# Patient Record
Sex: Female | Born: 1950 | Race: Black or African American | Hispanic: No | Marital: Single | Smoking: Former smoker
Health system: Southern US, Community
[De-identification: ages and names within clinical notes are randomized; demographics above are authoritative.]

## PROBLEM LIST (undated history)

## (undated) DIAGNOSIS — F419 Anxiety disorder, unspecified: Secondary | ICD-10-CM

## (undated) DIAGNOSIS — Z89611 Acquired absence of right leg above knee: Secondary | ICD-10-CM

## (undated) DIAGNOSIS — I219 Acute myocardial infarction, unspecified: Secondary | ICD-10-CM

## (undated) DIAGNOSIS — R42 Dizziness and giddiness: Secondary | ICD-10-CM

## (undated) DIAGNOSIS — G43909 Migraine, unspecified, not intractable, without status migrainosus: Secondary | ICD-10-CM

## (undated) DIAGNOSIS — I10 Essential (primary) hypertension: Secondary | ICD-10-CM

## (undated) DIAGNOSIS — I739 Peripheral vascular disease, unspecified: Secondary | ICD-10-CM

## (undated) DIAGNOSIS — R112 Nausea with vomiting, unspecified: Secondary | ICD-10-CM

## (undated) DIAGNOSIS — Z9889 Other specified postprocedural states: Secondary | ICD-10-CM

## (undated) DIAGNOSIS — M1990 Unspecified osteoarthritis, unspecified site: Secondary | ICD-10-CM

## (undated) DIAGNOSIS — H269 Unspecified cataract: Secondary | ICD-10-CM

## (undated) DIAGNOSIS — N184 Chronic kidney disease, stage 4 (severe): Secondary | ICD-10-CM

## (undated) DIAGNOSIS — I2510 Atherosclerotic heart disease of native coronary artery without angina pectoris: Secondary | ICD-10-CM

## (undated) DIAGNOSIS — I639 Cerebral infarction, unspecified: Secondary | ICD-10-CM

## (undated) DIAGNOSIS — R51 Headache: Secondary | ICD-10-CM

## (undated) DIAGNOSIS — I214 Non-ST elevation (NSTEMI) myocardial infarction: Secondary | ICD-10-CM

## (undated) DIAGNOSIS — K649 Unspecified hemorrhoids: Secondary | ICD-10-CM

## (undated) DIAGNOSIS — E785 Hyperlipidemia, unspecified: Secondary | ICD-10-CM

## (undated) DIAGNOSIS — Z972 Presence of dental prosthetic device (complete) (partial): Secondary | ICD-10-CM

## (undated) DIAGNOSIS — D649 Anemia, unspecified: Secondary | ICD-10-CM

## (undated) DIAGNOSIS — K08109 Complete loss of teeth, unspecified cause, unspecified class: Secondary | ICD-10-CM

## (undated) DIAGNOSIS — E119 Type 2 diabetes mellitus without complications: Secondary | ICD-10-CM

## (undated) DIAGNOSIS — K219 Gastro-esophageal reflux disease without esophagitis: Secondary | ICD-10-CM

## (undated) DIAGNOSIS — Z8719 Personal history of other diseases of the digestive system: Secondary | ICD-10-CM

## (undated) HISTORY — PX: TONSILLECTOMY: SUR1361

## (undated) HISTORY — DX: Essential (primary) hypertension: I10

## (undated) HISTORY — DX: Acute myocardial infarction, unspecified: I21.9

## (undated) HISTORY — PX: CLEFT PALATE REPAIR: SUR1165

## (undated) HISTORY — PX: TUBAL LIGATION: SHX77

## (undated) HISTORY — DX: Peripheral vascular disease, unspecified: I73.9

## (undated) HISTORY — PX: MYOMECTOMY: SHX85

## (undated) HISTORY — DX: Hyperlipidemia, unspecified: E78.5

## (undated) HISTORY — PX: MULTIPLE TOOTH EXTRACTIONS: SHX2053

## (undated) HISTORY — PX: DILATION AND CURETTAGE OF UTERUS: SHX78

## (undated) HISTORY — DX: Cerebral infarction, unspecified: I63.9

## (undated) HISTORY — PX: COLONOSCOPY: SHX174

---

## 1989-05-20 DIAGNOSIS — I219 Acute myocardial infarction, unspecified: Secondary | ICD-10-CM

## 1989-05-20 HISTORY — DX: Acute myocardial infarction, unspecified: I21.9

## 1997-12-26 ENCOUNTER — Ambulatory Visit (HOSPITAL_COMMUNITY): Admission: RE | Admit: 1997-12-26 | Discharge: 1997-12-26 | Payer: Self-pay | Admitting: *Deleted

## 1998-02-11 ENCOUNTER — Ambulatory Visit (HOSPITAL_COMMUNITY): Admission: RE | Admit: 1998-02-11 | Discharge: 1998-02-11 | Payer: Self-pay | Admitting: *Deleted

## 1999-01-06 ENCOUNTER — Ambulatory Visit (HOSPITAL_COMMUNITY): Admission: RE | Admit: 1999-01-06 | Discharge: 1999-01-06 | Payer: Self-pay | Admitting: Cardiology

## 1999-01-06 ENCOUNTER — Encounter: Payer: Self-pay | Admitting: Cardiology

## 2001-07-11 ENCOUNTER — Emergency Department (HOSPITAL_COMMUNITY): Admission: EM | Admit: 2001-07-11 | Discharge: 2001-07-11 | Payer: Self-pay | Admitting: Emergency Medicine

## 2002-12-06 ENCOUNTER — Ambulatory Visit (HOSPITAL_COMMUNITY): Admission: RE | Admit: 2002-12-06 | Discharge: 2002-12-06 | Payer: Self-pay | Admitting: Cardiology

## 2002-12-06 ENCOUNTER — Encounter: Payer: Self-pay | Admitting: Cardiology

## 2004-03-14 ENCOUNTER — Emergency Department (HOSPITAL_COMMUNITY): Admission: EM | Admit: 2004-03-14 | Discharge: 2004-03-14 | Payer: Self-pay | Admitting: Emergency Medicine

## 2004-04-21 ENCOUNTER — Emergency Department (HOSPITAL_COMMUNITY): Admission: EM | Admit: 2004-04-21 | Discharge: 2004-04-22 | Payer: Self-pay | Admitting: Emergency Medicine

## 2004-04-22 ENCOUNTER — Emergency Department (HOSPITAL_COMMUNITY): Admission: EM | Admit: 2004-04-22 | Discharge: 2004-04-22 | Payer: Self-pay | Admitting: Emergency Medicine

## 2005-08-05 ENCOUNTER — Ambulatory Visit: Payer: Self-pay | Admitting: Nurse Practitioner

## 2005-08-26 ENCOUNTER — Ambulatory Visit: Payer: Self-pay | Admitting: Nurse Practitioner

## 2005-09-23 ENCOUNTER — Ambulatory Visit: Payer: Self-pay | Admitting: *Deleted

## 2005-09-23 ENCOUNTER — Ambulatory Visit (HOSPITAL_COMMUNITY): Admission: RE | Admit: 2005-09-23 | Discharge: 2005-09-23 | Payer: Self-pay | Admitting: Family Medicine

## 2005-12-01 ENCOUNTER — Ambulatory Visit: Payer: Self-pay | Admitting: Nurse Practitioner

## 2006-02-17 ENCOUNTER — Ambulatory Visit: Payer: Self-pay | Admitting: Nurse Practitioner

## 2006-08-17 ENCOUNTER — Ambulatory Visit: Payer: Self-pay | Admitting: Nurse Practitioner

## 2007-06-06 ENCOUNTER — Encounter (INDEPENDENT_AMBULATORY_CARE_PROVIDER_SITE_OTHER): Payer: Self-pay | Admitting: *Deleted

## 2007-07-06 ENCOUNTER — Encounter (INDEPENDENT_AMBULATORY_CARE_PROVIDER_SITE_OTHER): Payer: Self-pay | Admitting: Nurse Practitioner

## 2007-07-06 ENCOUNTER — Ambulatory Visit: Payer: Self-pay | Admitting: Family Medicine

## 2007-07-06 LAB — CONVERTED CEMR LAB
ALT: 17 units/L (ref 0–35)
AST: 13 units/L (ref 0–37)
Basophils Absolute: 0 10*3/uL (ref 0.0–0.1)
Basophils Relative: 0 % (ref 0–1)
Calcium: 10 mg/dL (ref 8.4–10.5)
Chloride: 101 meq/L (ref 96–112)
Creatinine, Ser: 0.68 mg/dL (ref 0.40–1.20)
Hemoglobin: 13.4 g/dL (ref 12.0–15.0)
MCHC: 31.5 g/dL (ref 30.0–36.0)
Microalb, Ur: 2.92 mg/dL — ABNORMAL HIGH (ref 0.00–1.89)
Monocytes Absolute: 0.5 10*3/uL (ref 0.2–0.7)
Neutro Abs: 4.1 10*3/uL (ref 1.7–7.7)
Neutrophils Relative %: 50 % (ref 43–77)
Platelets: 279 10*3/uL (ref 150–400)
RDW: 12.6 % (ref 11.5–14.0)
Sodium: 139 meq/L (ref 135–145)
Total Protein: 6.6 g/dL (ref 6.0–8.3)

## 2008-07-30 ENCOUNTER — Encounter (INDEPENDENT_AMBULATORY_CARE_PROVIDER_SITE_OTHER): Payer: Self-pay | Admitting: Internal Medicine

## 2008-07-30 ENCOUNTER — Ambulatory Visit: Payer: Self-pay | Admitting: Internal Medicine

## 2008-07-30 LAB — CONVERTED CEMR LAB
AST: 10 units/L (ref 0–37)
BUN: 14 mg/dL (ref 6–23)
CO2: 23 meq/L (ref 19–32)
Calcium: 9.6 mg/dL (ref 8.4–10.5)
Chloride: 102 meq/L (ref 96–112)
Creatinine, Ser: 0.66 mg/dL (ref 0.40–1.20)

## 2008-09-15 ENCOUNTER — Ambulatory Visit: Payer: Self-pay | Admitting: Internal Medicine

## 2008-09-26 ENCOUNTER — Ambulatory Visit (HOSPITAL_COMMUNITY): Admission: RE | Admit: 2008-09-26 | Discharge: 2008-09-26 | Payer: Self-pay | Admitting: Family Medicine

## 2008-11-12 ENCOUNTER — Ambulatory Visit: Payer: Self-pay | Admitting: Family Medicine

## 2009-04-10 ENCOUNTER — Ambulatory Visit: Payer: Self-pay | Admitting: Family Medicine

## 2009-07-02 ENCOUNTER — Encounter (INDEPENDENT_AMBULATORY_CARE_PROVIDER_SITE_OTHER): Payer: Self-pay | Admitting: Internal Medicine

## 2009-07-02 ENCOUNTER — Ambulatory Visit: Payer: Self-pay | Admitting: Internal Medicine

## 2009-07-02 LAB — CONVERTED CEMR LAB
Cholesterol: 296 mg/dL — ABNORMAL HIGH (ref 0–200)
HDL: 48 mg/dL (ref >39)
Total CHOL/HDL Ratio: 6.2

## 2009-07-10 ENCOUNTER — Ambulatory Visit: Payer: Self-pay | Admitting: Internal Medicine

## 2009-11-20 ENCOUNTER — Encounter (INDEPENDENT_AMBULATORY_CARE_PROVIDER_SITE_OTHER): Payer: Self-pay | Admitting: Internal Medicine

## 2009-11-20 ENCOUNTER — Ambulatory Visit: Payer: Self-pay | Admitting: Family Medicine

## 2009-12-21 ENCOUNTER — Ambulatory Visit: Payer: Self-pay | Admitting: Internal Medicine

## 2009-12-21 LAB — CONVERTED CEMR LAB
BUN: 18 mg/dL (ref 6–23)
Calcium: 9.6 mg/dL (ref 8.4–10.5)
Glucose, Bld: 448 mg/dL — ABNORMAL HIGH (ref 70–99)
Hgb A1c MFr Bld: 14.1 % — ABNORMAL HIGH (ref 4.6–6.1)

## 2010-01-01 ENCOUNTER — Ambulatory Visit: Payer: Self-pay | Admitting: Internal Medicine

## 2010-03-01 ENCOUNTER — Ambulatory Visit: Payer: Self-pay | Admitting: Internal Medicine

## 2010-07-20 ENCOUNTER — Encounter (INDEPENDENT_AMBULATORY_CARE_PROVIDER_SITE_OTHER): Payer: Self-pay | Admitting: *Deleted

## 2010-07-20 LAB — CONVERTED CEMR LAB
AST: 11 units/L (ref 0–37)
Alkaline Phosphatase: 88 units/L (ref 39–117)
BUN: 14 mg/dL (ref 6–23)
Creatinine, Ser: 0.73 mg/dL (ref 0.40–1.20)
HDL: 46 mg/dL (ref >39)
Hgb A1c MFr Bld: 12 % — ABNORMAL HIGH (ref <5.7)
LDL Cholesterol: 221 mg/dL — ABNORMAL HIGH (ref 0–99)
Total Bilirubin: 0.8 mg/dL (ref 0.3–1.2)
Total CHOL/HDL Ratio: 6.7

## 2010-08-17 ENCOUNTER — Telehealth (INDEPENDENT_AMBULATORY_CARE_PROVIDER_SITE_OTHER): Payer: Self-pay | Admitting: Radiology

## 2010-08-18 ENCOUNTER — Ambulatory Visit: Payer: Self-pay

## 2010-08-18 ENCOUNTER — Encounter: Payer: Self-pay | Admitting: Cardiology

## 2010-08-18 ENCOUNTER — Ambulatory Visit: Payer: Self-pay | Admitting: Cardiology

## 2010-08-18 ENCOUNTER — Ambulatory Visit (HOSPITAL_COMMUNITY)
Admission: RE | Admit: 2010-08-18 | Discharge: 2010-08-18 | Payer: Self-pay | Source: Home / Self Care | Admitting: Internal Medicine

## 2010-08-18 ENCOUNTER — Encounter (INDEPENDENT_AMBULATORY_CARE_PROVIDER_SITE_OTHER): Payer: Self-pay

## 2010-10-21 NOTE — Progress Notes (Signed)
Summary: stress echo pre-procedure  Phone Note Outgoing Call   Call placed by: Charlton Amor, CNMT,  August 17, 2010 2:27 PM Call placed to: Patient Reason for Call: Confirm/change Appt Summary of Call: Spoke with patient about her appt. for Stress echocardiogram.

## 2010-10-21 NOTE — Miscellaneous (Signed)
Summary: IV for Dobutamine Echo  Clinical Lists Changes     IV 22 G angiocath (R) hand for Dobutamine Echo. Oluwadamilare Tobler,RN.

## 2011-05-09 ENCOUNTER — Encounter (INDEPENDENT_AMBULATORY_CARE_PROVIDER_SITE_OTHER): Payer: Self-pay

## 2011-05-09 ENCOUNTER — Encounter: Payer: Self-pay | Admitting: Surgery

## 2011-05-09 ENCOUNTER — Ambulatory Visit (INDEPENDENT_AMBULATORY_CARE_PROVIDER_SITE_OTHER): Payer: Self-pay | Admitting: Surgery

## 2011-05-09 VITALS — BP 174/88 | HR 88 | Temp 98.2°F | Resp 18

## 2011-05-09 DIAGNOSIS — M79609 Pain in unspecified limb: Secondary | ICD-10-CM

## 2011-05-09 DIAGNOSIS — I219 Acute myocardial infarction, unspecified: Secondary | ICD-10-CM | POA: Insufficient documentation

## 2011-05-09 DIAGNOSIS — I739 Peripheral vascular disease, unspecified: Secondary | ICD-10-CM

## 2011-05-09 DIAGNOSIS — I10 Essential (primary) hypertension: Secondary | ICD-10-CM | POA: Insufficient documentation

## 2011-05-09 DIAGNOSIS — E785 Hyperlipidemia, unspecified: Secondary | ICD-10-CM | POA: Insufficient documentation

## 2011-05-09 DIAGNOSIS — I639 Cerebral infarction, unspecified: Secondary | ICD-10-CM | POA: Insufficient documentation

## 2011-05-09 DIAGNOSIS — I70229 Atherosclerosis of native arteries of extremities with rest pain, unspecified extremity: Secondary | ICD-10-CM | POA: Insufficient documentation

## 2011-05-09 NOTE — Progress Notes (Signed)
Subjective:     Patient ID: Jenna Brown, female   DOB: Oct 23, 1950, 60 y.o.   MRN: [id number]  HPI This is a 60 year old female that I'm seeing as an add-on today, new patient visit, at the request of [facility]. I am seeing her for right foot pain which began in June when she stubbed her right great toe. This causes her significant pain at night. She is not having any fevers or chills. She denies ulceration. She also does not endorse symptoms of claudication. She is able to walk without difficulty.  The patient is relatively noncompliant. Her blood sugars have been uncontrolled most recently the 280s. Her A1c was 9.1. Her blood pressure has been elevated today in the office it is in the 180s. She also suffers from hypercholesterolemia.  Review of Systems Positive for weight loss and was documented in the history of present illness all others negative as documented in the encounter form Past Medical History  Diagnosis Date  . Diabetes mellitus   . Hypertension   . Hyperlipidemia   . Myocardial infarction 1990's  . Peripheral vascular disease   . Stroke     TIA history    History  Substance Use Topics  . Smoking status: Current Everyday Smoker -- 0.2 packs/day for 40 years    Types: Cigarettes  . Smokeless tobacco: Not on file  . Alcohol Use: No    Family History  Problem Relation Age of Onset  . Cancer Mother   . Diabetes Father     Not on File  Current outpatient prescriptions:ALPRAZolam (XANAX) 0.25 MG tablet, Take 0.25 mg by mouth 3 (three) times daily as needed.  , Disp: , Rfl: ;  glyBURIDE-metformin (GLUCOVANCE) 5-500 MG per tablet, Take 1 tablet by mouth 2 (two) times daily with a meal.  , Disp: , Rfl: ;  metoprolol (TOPROL-XL) 100 MG 24 hr tablet, Take 100 mg by mouth daily.  , Disp: , Rfl:   There were no vitals filed for this visit.  There is no height or weight on file to calculate BMI.          Objective:   Physical Exam  Constitutional: She is  oriented to person, place, and time. She appears well-developed and well-nourished.  HENT:  Head: Normocephalic and atraumatic.  Neck: Neck supple.  Cardiovascular: Normal rate and regular rhythm.        No carotid bruits  Palpable bilateral femoral pulses  Palpable bilateral popliteal pulses  Nonpalpable pedal pulses  Pulmonary/Chest: Effort normal and breath sounds normal. No respiratory distress. She has no wheezes. She has no rales.  Abdominal: Soft. There is no tenderness.  Musculoskeletal: Normal range of motion.  Neurological: She is alert and oriented to person, place, and time.  Skin: Skin is warm and dry.        There is slight discoloration of the right great toe.   Diagnostic studies: ABI today was 0.6 on the right 0.9 on the left duplex reveals elevated velocities within the popliteal artery behind the knee with a velocity of 637 cm/s    Assessment:    peripheral vascular disease with rest pain    Plan:     I discussed the findings today with the patient I reiterated to her that this can potentially lead to a toe threatening or limb threatening process. I believe that her pain and nonhealing nature of her right great toe is secondary to her vascular insufficiency I recommended that we proceed with angiography  with access in the left groin to study the right leg and intervene if possible. The patient understands that we may place a stent or perform balloon angioplasty to alleviate a stenosis. She also understands that she may require surgical revascularization. I discussed the risks and benefits of angiography with intervention to the patient these included bleeding and possible embolization which could require operative repair. She understands and wishes to proceed. We also discussed in need for medical compliance. I have added A. aspirin to her medical regimen. Her catheterization has been scheduled for next Tuesday, August 28

## 2011-05-09 NOTE — Procedures (Unsigned)
LOWER EXTREMITY ARTERIAL DUPLEX  INDICATION:  Right toe pain.  HISTORY: Diabetes:  Yes. Cardiac:  No. Hypertension:  Yes. Smoking:  Yes. Previous Surgery:  No.  SINGLE LEVEL ARTERIAL EXAM                         RIGHT                LEFT Brachial:               178                  176 Anterior tibial:        114                  171 Posterior tibial:       100                  160 Peroneal: Ankle/Brachial Index:   0.64                 0.96  LOWER EXTREMITY ARTERIAL DUPLEX EXAM  DUPLEX:  Patent right lower extremity arterial system with a stenosis noted in the right popliteal artery of 637 cm/s.  IMPRESSION: 1. Right ankle brachial indices suggest moderate arterial disease. 2. Left ankle brachial indices are within normal limits. 3. Patent right arterial duplex with elevated velocities in the right     popliteal artery as described above.  ___________________________________________ V. [name], MD  EM/MEDQ  D:  05/09/2011  T:  05/09/2011  Job:  [id number]

## 2011-05-10 ENCOUNTER — Encounter: Payer: Self-pay | Admitting: Vascular Surgery

## 2011-05-17 ENCOUNTER — Ambulatory Visit (HOSPITAL_COMMUNITY): Payer: Medicaid Other

## 2011-05-17 ENCOUNTER — Ambulatory Visit (HOSPITAL_COMMUNITY)
Admission: RE | Admit: 2011-05-17 | Discharge: 2011-05-17 | Disposition: A | Discharge status: Home / Self Care | Payer: Medicaid Other | Source: Ambulatory Visit | Attending: Surgery | Admitting: Surgery

## 2011-05-17 ENCOUNTER — Telehealth: Payer: Self-pay | Admitting: *Deleted

## 2011-05-17 DIAGNOSIS — L98499 Non-pressure chronic ulcer of skin of other sites with unspecified severity: Secondary | ICD-10-CM

## 2011-05-17 DIAGNOSIS — I739 Peripheral vascular disease, unspecified: Secondary | ICD-10-CM

## 2011-05-17 DIAGNOSIS — Z01818 Encounter for other preprocedural examination: Secondary | ICD-10-CM

## 2011-05-17 DIAGNOSIS — L97409 Non-pressure chronic ulcer of unspecified heel and midfoot with unspecified severity: Secondary | ICD-10-CM | POA: Insufficient documentation

## 2011-05-17 LAB — SURGICAL PCR SCREEN
MRSA, PCR: NEGATIVE
Staphylococcus aureus: NEGATIVE

## 2011-05-17 LAB — ABO/RH: ABO/RH(D): A POS

## 2011-05-17 LAB — URINE MICROSCOPIC-ADD ON

## 2011-05-17 LAB — URINALYSIS, ROUTINE W REFLEX MICROSCOPIC
Leukocytes, UA: NEGATIVE
Nitrite: NEGATIVE
Specific Gravity, Urine: 1.043 — ABNORMAL HIGH (ref 1.005–1.030)
Urobilinogen, UA: 0.2 mg/dL (ref 0.0–1.0)

## 2011-05-17 LAB — POCT I-STAT, CHEM 8
Calcium, Ion: 1.16 mmol/L (ref 1.12–1.32)
Chloride: 103 mEq/L (ref 96–112)
Glucose, Bld: 329 mg/dL — ABNORMAL HIGH (ref 70–99)
HCT: 39 % (ref 36.0–46.0)

## 2011-05-17 LAB — PROTIME-INR: INR: 0.94 (ref 0.00–1.49)

## 2011-05-18 ENCOUNTER — Other Ambulatory Visit: Payer: Self-pay | Admitting: *Deleted

## 2011-05-18 DIAGNOSIS — Z01818 Encounter for other preprocedural examination: Secondary | ICD-10-CM

## 2011-05-19 ENCOUNTER — Other Ambulatory Visit (INDEPENDENT_AMBULATORY_CARE_PROVIDER_SITE_OTHER): Payer: Self-pay

## 2011-05-19 DIAGNOSIS — Z01818 Encounter for other preprocedural examination: Secondary | ICD-10-CM

## 2011-05-19 NOTE — Telephone Encounter (Signed)
Ordered studies per Dr [name] for preop clearance before surgery

## 2011-05-19 NOTE — Op Note (Signed)
  Jenna Brown, Jenna Brown                 ACCOUNT NO.:  [id number]  MEDICAL RECORD NO.:  [id number]  LOCATION:  SDSC                         FACILITY:  [facility]  PHYSICIAN:  Theotis Burrow IV, MDDATE OF BIRTH:  01-May-1951  DATE OF PROCEDURE:  05/17/2011 DATE OF DISCHARGE:                              OPERATIVE REPORT   PREOPERATIVE DIAGNOSIS:  Right leg ulcer.  POSTOPERATIVE DIAGNOSIS:  Right leg ulcer.  PROCEDURES PERFORMED: 1. Ultrasound access, left femoral artery. 2. Abdominal aortogram. 3. Bilateral lower extremity runoff. 4. Second-order catheterization.  SURGEON: 1. Annamarie Major IV, MD  INDICATIONS:  This is a 60 year old female I saw as add-on for a right foot wound.  She comes in today for arteriogram.  PROCEDURE:  The patient was identified in the holding and taken to room A, placed supine on the table.  Both groins were prepped and draped in usual sterile fashion.  Time-out was called.  The left femoral artery was evaluated with ultrasound and found to be widely patent.  Digital ultrasound images acquired.  The left femoral artery was accessed under ultrasound guidance.  An 18-gauge needle and 0.35 wire was advanced into the aorta and under fluoroscopic visualization, a 5-French sheath was placed.  Over the wire, an Omni flush catheter was advanced to the level of L1, abdominal aortogram was obtained.  Next, using the Omni flush catheter and Bentson wire, the aortic bifurcation was crossed. Catheters were placed in the right external iliac artery and right leg runoff was performed.  Retrograde evaluation of the left leg was done through the sheath.  FINDINGS:  Aortogram:  The visualized portions of the suprarenal abdominal aorta showed no significant disease.  There were single renal arteries which were widely patent.  The infrarenal abdominal aorta is widely patent.  Bilateral common external and internal iliac arteries are widely patent.  Right lower extremity:   The right common femoral artery is widely patent and the right profunda femoral artery is widely patent.  The right superficial femoral artery is patent at the adductor canal in the proximal popliteal, distal superficial femoral artery is occluded. There is reconstitution of the below-knee popliteal artery with three- vessel runoff.  The dominant runoff vessel is the posterior tibial.  Left lower extremity:  The left common femoral artery is widely patent. The left profunda femoral artery is widely patent.  The popliteal artery has an area of high-grade stenosis, approximately 95% just proximal to the joint space.  There is three-vessel runoff.  After the above images were obtained, decision was made to terminate the procedure.  Catheters and wires were removed, and the patient was taken to holding area for sheath pull.  IMPRESSION: 1. Right popliteal artery occlusion. 2. 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     Jenna Abrahams, MD     VWB/MEDQ  D:  05/17/2011  T:  05/17/2011  Job:  [id number]  Electronically Signed by Orvan Falconer IV MD on 05/19/2011 12:17:09 AM

## 2011-05-25 ENCOUNTER — Ambulatory Visit (HOSPITAL_COMMUNITY): Payer: Medicaid Other | Attending: Surgery | Admitting: Radiology

## 2011-05-25 VITALS — Ht 64.0 in | Wt 169.0 lb

## 2011-05-25 DIAGNOSIS — R0602 Shortness of breath: Secondary | ICD-10-CM

## 2011-05-25 DIAGNOSIS — Z01810 Encounter for preprocedural cardiovascular examination: Secondary | ICD-10-CM | POA: Insufficient documentation

## 2011-05-25 DIAGNOSIS — E119 Type 2 diabetes mellitus without complications: Secondary | ICD-10-CM

## 2011-05-25 DIAGNOSIS — R079 Chest pain, unspecified: Secondary | ICD-10-CM

## 2011-05-25 DIAGNOSIS — Z01818 Encounter for other preprocedural examination: Secondary | ICD-10-CM

## 2011-05-25 DIAGNOSIS — R0989 Other specified symptoms and signs involving the circulatory and respiratory systems: Secondary | ICD-10-CM

## 2011-05-25 MED ORDER — REGADENOSON 0.4 MG/5ML IV SOLN
0.4000 mg | Freq: Once | INTRAVENOUS | Status: AC
  Administered 2011-05-25: 0.4 mg via INTRAVENOUS

## 2011-05-25 MED ORDER — TECHNETIUM TC 99M TETROFOSMIN IV KIT
33.0000 | PACK | Freq: Once | INTRAVENOUS | Status: AC | PRN
  Administered 2011-05-25: 33 via INTRAVENOUS

## 2011-05-25 MED ORDER — TECHNETIUM TC 99M TETROFOSMIN IV KIT
11.0000 | PACK | Freq: Once | INTRAVENOUS | Status: AC | PRN
  Administered 2011-05-25: 11 via INTRAVENOUS

## 2011-05-25 NOTE — Progress Notes (Signed)
Jenna Brown [phone]  Cardiology Nuclear Med Study  Jenna Brown is a 60 y.o. female [id number] [birth year]   Nuclear Med Background Indication for Stress Test:  Evaluation for Ischemia and Surgical Clearance:Pending Vascular surgery on 05/27/11 by Dr. Harold Brown History: 1990's Myocardial Infarction and 03/04 Myocardial Perfusion Study: (-) EF 65% Cardiac Risk Factors: Family History - CAD, Hypertension, Lipids, NIDDM, PVD, Smoker and TIA  Symptoms:  DOE, Fatigue and Light-Headedness   Nuclear Pre-Procedure Caffeine/Decaff Intake:  None NPO After: 8:00pm   Lungs:  clear IV 0.9% NS with Angio Cath:  22g  IV Site: L Forearm  IV Started by:  Jenna Brown, CNMT  Chest Size (in):  38 Cup Size: D  Height: 5\' 4"  (1.626 m)  Weight:  169 lb (76.658 kg)  BMI:  Body mass index is 29.01 kg/(m^2). Tech Comments:  Took toprol this am; held glucovance this am    Nuclear Med Study 1 or 2 day study: 1 day  Stress Test Type:  Carlton Adam  Reading MD: Jenna Champagne, MD  Order Authorizing Provider:  Harold Brown  Resting Radionuclide: Technetium 87m Tetrofosmin  Resting Radionuclide Dose: 11.0 mCi   Stress Radionuclide:  Technetium 16m Tetrofosmin  Stress Radionuclide Dose: 33.0 mCi           Stress Protocol Rest HR: 75 Stress HR: 104  Rest BP: 122/69 Stress BP: 160/77  Exercise Time (min): n/a METS: n/a   Predicted Max HR: 163 bpm % Max HR: 63.8 bpm Rate Pressure Product: [id number]   Dose of Adenosine (mg):  n/a Dose of Lexiscan: 0.4 mg  Dose of Atropine (mg): n/a Dose of Dobutamine: n/a mcg/kg/min (at max HR)  Stress Test Technologist: Perrin Maltese, EMT-P  Nuclear Technologist:  Charlton Amor, CNMT     Rest Procedure:  Myocardial perfusion imaging was performed at rest 45 minutes following the intravenous administration of Technetium 13m Tetrofosmin. Rest ECG: NSR  Stress Procedure:  The patient  received IV Lexiscan 0.4 mg over 15-seconds.  Technetium 79m Tetrofosmin injected at 30-seconds.  There were no significant changes with Lexiscan.  Quantitative spect images were obtained after a 45 minute delay. Stress ECG: No significant change from baseline ECG  QPS Raw Data Images:  Normal; no motion artifact; normal heart/lung ratio. Stress Images:  The septal wall has increased counts consistent with LV hypertrophy.  Rest Images:  Septal wall has increased counts consistent with LV hypertrophy.  Subtraction (SDS):  There is no evidence of scar or ischemia. Transient Ischemic Dilatation (Normal <1.22):  1.09 Lung/Heart Ratio (Normal <0.45):  0.23  Quantitative Gated Spect Images QGS EDV:  67 ml QGS ESV:  25 ml QGS cine images:  NL LV Function; NL Wall Motion QGS EF: 62%  Impression Exercise Capacity:  Lexiscan with no exercise. BP Response:  Normal blood pressure response. Clinical Symptoms:  Chest heaviness ECG Impression:  No significant ST segment change suggestive of ischemia. Comparison with Prior Nuclear Study: No images to compare  Overall Impression:  Normal stress nuclear study.  Septal wall has increased counts suggestive of LV hypertrophy.   Jenna Brown [employer]

## 2011-05-27 ENCOUNTER — Inpatient Hospital Stay (HOSPITAL_COMMUNITY)
Admission: RE | Admit: 2011-05-27 | Discharge: 2011-06-07 | DRG: 254 | Disposition: A | Discharge status: Home Health | Payer: Medicaid Other | Source: Ambulatory Visit | Attending: Surgery | Admitting: Surgery

## 2011-05-27 DIAGNOSIS — E119 Type 2 diabetes mellitus without complications: Secondary | ICD-10-CM | POA: Diagnosis present

## 2011-05-27 DIAGNOSIS — J4489 Other specified chronic obstructive pulmonary disease: Secondary | ICD-10-CM | POA: Diagnosis present

## 2011-05-27 DIAGNOSIS — Z886 Allergy status to analgesic agent status: Secondary | ICD-10-CM

## 2011-05-27 DIAGNOSIS — L98499 Non-pressure chronic ulcer of skin of other sites with unspecified severity: Secondary | ICD-10-CM

## 2011-05-27 DIAGNOSIS — F17200 Nicotine dependence, unspecified, uncomplicated: Secondary | ICD-10-CM | POA: Diagnosis present

## 2011-05-27 DIAGNOSIS — I10 Essential (primary) hypertension: Secondary | ICD-10-CM | POA: Diagnosis present

## 2011-05-27 DIAGNOSIS — I739 Peripheral vascular disease, unspecified: Secondary | ICD-10-CM

## 2011-05-27 DIAGNOSIS — I252 Old myocardial infarction: Secondary | ICD-10-CM

## 2011-05-27 DIAGNOSIS — M129 Arthropathy, unspecified: Secondary | ICD-10-CM | POA: Diagnosis present

## 2011-05-27 DIAGNOSIS — Z8673 Personal history of transient ischemic attack (TIA), and cerebral infarction without residual deficits: Secondary | ICD-10-CM

## 2011-05-27 DIAGNOSIS — J449 Chronic obstructive pulmonary disease, unspecified: Secondary | ICD-10-CM | POA: Diagnosis present

## 2011-05-27 DIAGNOSIS — F411 Generalized anxiety disorder: Secondary | ICD-10-CM | POA: Diagnosis present

## 2011-05-27 HISTORY — PX: PR VEIN BYPASS GRAFT,AORTO-FEM-POP: 35551

## 2011-05-27 LAB — COMPREHENSIVE METABOLIC PANEL
ALT: 11 U/L (ref 0–35)
AST: 12 U/L (ref 0–37)
Alkaline Phosphatase: 120 U/L — ABNORMAL HIGH (ref 39–117)
CO2: 28 mEq/L (ref 19–32)
GFR calc Af Amer: >60 mL/min (ref >60)
GFR calc non Af Amer: >60 mL/min (ref >60)
Glucose, Bld: 293 mg/dL — ABNORMAL HIGH (ref 70–99)
Potassium: 4.7 mEq/L (ref 3.5–5.1)
Sodium: 139 mEq/L (ref 135–145)

## 2011-05-27 LAB — CBC
Hemoglobin: 12.9 g/dL (ref 12.0–15.0)
Platelets: 321 10*3/uL (ref 150–400)
RBC: 4.78 MIL/uL (ref 3.87–5.11)
WBC: 8.5 10*3/uL (ref 4.0–10.5)

## 2011-05-27 LAB — GLUCOSE, CAPILLARY
Glucose-Capillary: 405 mg/dL — ABNORMAL HIGH (ref 70–99)
Glucose-Capillary: 348 mg/dL — ABNORMAL HIGH (ref 70–99)

## 2011-05-28 DIAGNOSIS — I739 Peripheral vascular disease, unspecified: Secondary | ICD-10-CM

## 2011-05-28 LAB — CBC
HCT: 36.3 % (ref 36.0–46.0)
Hemoglobin: 11.8 g/dL — ABNORMAL LOW (ref 12.0–15.0)
MCH: 26.4 pg (ref 26.0–34.0)
MCHC: 32.5 g/dL (ref 30.0–36.0)

## 2011-05-28 LAB — GLUCOSE, CAPILLARY
Glucose-Capillary: 213 mg/dL — ABNORMAL HIGH (ref 70–99)
Glucose-Capillary: 251 mg/dL — ABNORMAL HIGH (ref 70–99)

## 2011-05-28 LAB — BASIC METABOLIC PANEL
BUN: 11 mg/dL (ref 6–23)
Calcium: 9.2 mg/dL (ref 8.4–10.5)
GFR calc non Af Amer: >60 mL/min (ref >60)
Glucose, Bld: 203 mg/dL — ABNORMAL HIGH (ref 70–99)
Sodium: 139 mEq/L (ref 135–145)

## 2011-05-28 LAB — HEMOGLOBIN A1C: Hgb A1c MFr Bld: 9.6 % — ABNORMAL HIGH (ref <5.7)

## 2011-05-29 LAB — GLUCOSE, CAPILLARY
Glucose-Capillary: 184 mg/dL — ABNORMAL HIGH (ref 70–99)
Glucose-Capillary: 256 mg/dL — ABNORMAL HIGH (ref 70–99)

## 2011-05-30 ENCOUNTER — Inpatient Hospital Stay (HOSPITAL_COMMUNITY): Payer: Medicaid Other

## 2011-05-30 LAB — GLUCOSE, CAPILLARY
Glucose-Capillary: 248 mg/dL — ABNORMAL HIGH (ref 70–99)
Glucose-Capillary: 190 mg/dL — ABNORMAL HIGH (ref 70–99)

## 2011-05-30 MED ORDER — IOHEXOL 350 MG/ML SOLN: 120.0000 mL | Freq: Once | INTRAVENOUS | Status: AC | PRN

## 2011-05-31 ENCOUNTER — Inpatient Hospital Stay (HOSPITAL_COMMUNITY): Payer: Medicaid Other

## 2011-05-31 DIAGNOSIS — I70219 Atherosclerosis of native arteries of extremities with intermittent claudication, unspecified extremity: Secondary | ICD-10-CM

## 2011-05-31 DIAGNOSIS — T82898A Other specified complication of vascular prosthetic devices, implants and grafts, initial encounter: Secondary | ICD-10-CM

## 2011-05-31 LAB — GLUCOSE, CAPILLARY: Glucose-Capillary: 187 mg/dL — ABNORMAL HIGH (ref 70–99)

## 2011-05-31 LAB — BASIC METABOLIC PANEL
BUN: 10 mg/dL (ref 6–23)
Creatinine, Ser: 0.55 mg/dL (ref 0.50–1.10)
GFR calc non Af Amer: >60 mL/min (ref >60)
Glucose, Bld: 221 mg/dL — ABNORMAL HIGH (ref 70–99)
Potassium: 4.1 mEq/L (ref 3.5–5.1)

## 2011-05-31 LAB — HEMOGLOBIN AND HEMATOCRIT, BLOOD: Hemoglobin: 10.4 g/dL — ABNORMAL LOW (ref 12.0–15.0)

## 2011-05-31 LAB — APTT: aPTT: 32 seconds (ref 24–37)

## 2011-06-01 LAB — CBC
HCT: 30.6 % — ABNORMAL LOW (ref 36.0–46.0)
Hemoglobin: 10.1 g/dL — ABNORMAL LOW (ref 12.0–15.0)
MCH: 26.9 pg (ref 26.0–34.0)
MCHC: 33 g/dL (ref 30.0–36.0)

## 2011-06-01 LAB — BASIC METABOLIC PANEL
CO2: 31 mEq/L (ref 19–32)
Calcium: 9.1 mg/dL (ref 8.4–10.5)
Creatinine, Ser: 0.55 mg/dL (ref 0.50–1.10)
Glucose, Bld: 220 mg/dL — ABNORMAL HIGH (ref 70–99)

## 2011-06-01 LAB — GLUCOSE, CAPILLARY
Glucose-Capillary: 149 mg/dL — ABNORMAL HIGH (ref 70–99)
Glucose-Capillary: 148 mg/dL — ABNORMAL HIGH (ref 70–99)
Glucose-Capillary: 208 mg/dL — ABNORMAL HIGH (ref 70–99)

## 2011-06-02 DIAGNOSIS — Z01810 Encounter for preprocedural cardiovascular examination: Secondary | ICD-10-CM

## 2011-06-02 LAB — GLUCOSE, CAPILLARY
Glucose-Capillary: 165 mg/dL — ABNORMAL HIGH (ref 70–99)
Glucose-Capillary: 188 mg/dL — ABNORMAL HIGH (ref 70–99)

## 2011-06-02 LAB — CBC
HCT: 31.2 % — ABNORMAL LOW (ref 36.0–46.0)
MCV: 81.7 fL (ref 78.0–100.0)
Platelets: 292 10*3/uL (ref 150–400)
RBC: 3.82 MIL/uL — ABNORMAL LOW (ref 3.87–5.11)
WBC: 10 10*3/uL (ref 4.0–10.5)

## 2011-06-03 LAB — CBC
HCT: 29.9 % — ABNORMAL LOW (ref 36.0–46.0)
Hemoglobin: 9.7 g/dL — ABNORMAL LOW (ref 12.0–15.0)
MCV: 81.7 fL (ref 78.0–100.0)
RDW: 12.1 % (ref 11.5–15.5)
WBC: 10 10*3/uL (ref 4.0–10.5)

## 2011-06-03 LAB — GLUCOSE, CAPILLARY
Glucose-Capillary: 166 mg/dL — ABNORMAL HIGH (ref 70–99)
Glucose-Capillary: 196 mg/dL — ABNORMAL HIGH (ref 70–99)

## 2011-06-04 LAB — CBC
HCT: 28.3 % — ABNORMAL LOW (ref 36.0–46.0)
Hemoglobin: 9.1 g/dL — ABNORMAL LOW (ref 12.0–15.0)
RDW: 12.3 % (ref 11.5–15.5)
WBC: 10.9 10*3/uL — ABNORMAL HIGH (ref 4.0–10.5)

## 2011-06-05 LAB — GLUCOSE, CAPILLARY: Glucose-Capillary: 173 mg/dL — ABNORMAL HIGH (ref 70–99)

## 2011-06-05 LAB — CBC
HCT: 28.5 % — ABNORMAL LOW (ref 36.0–46.0)
MCV: 81.7 fL (ref 78.0–100.0)
Platelets: 349 10*3/uL (ref 150–400)
RBC: 3.49 MIL/uL — ABNORMAL LOW (ref 3.87–5.11)
WBC: 10 10*3/uL (ref 4.0–10.5)

## 2011-06-06 LAB — DIFFERENTIAL
Eosinophils Absolute: 0.1 10*3/uL (ref 0.0–0.7)
Eosinophils Relative: 1 % (ref 0–5)
Lymphs Abs: 3.2 10*3/uL (ref 0.7–4.0)
Monocytes Absolute: 0.7 10*3/uL (ref 0.1–1.0)

## 2011-06-06 LAB — GLUCOSE, CAPILLARY
Glucose-Capillary: 195 mg/dL — ABNORMAL HIGH (ref 70–99)
Glucose-Capillary: 159 mg/dL — ABNORMAL HIGH (ref 70–99)
Glucose-Capillary: 174 mg/dL — ABNORMAL HIGH (ref 70–99)

## 2011-06-06 LAB — CBC
MCH: 26.3 pg (ref 26.0–34.0)
MCHC: 32.5 g/dL (ref 30.0–36.0)
MCV: 81.1 fL (ref 78.0–100.0)
Platelets: 398 10*3/uL (ref 150–400)
RDW: 12.3 % (ref 11.5–15.5)
WBC: 10 10*3/uL (ref 4.0–10.5)

## 2011-06-06 LAB — BASIC METABOLIC PANEL
BUN: 11 mg/dL (ref 6–23)
Calcium: 9.6 mg/dL (ref 8.4–10.5)
Chloride: 98 mEq/L (ref 96–112)
Creatinine, Ser: 0.59 mg/dL (ref 0.50–1.10)
GFR calc Af Amer: >60 mL/min (ref >60)
GFR calc non Af Amer: >60 mL/min (ref >60)

## 2011-06-06 NOTE — Procedures (Unsigned)
CAROTID DUPLEX EXAM  INDICATION:  Preoperative evaluation.  HISTORY: Diabetes:  Yes. Cardiac:  No. Hypertension:  Yes. Smoking:  Yes. Previous Surgery:  No. CV History:  Currently asymptomatic. Amaurosis Fugax No, Paresthesias No, Hemiparesis No                                      RIGHT             LEFT Brachial systolic pressure:         174               172 Brachial Doppler waveforms:         Normal            Normal Vertebral direction of flow:        Antegrade         Antegrade DUPLEX VELOCITIES (cm/sec) CCA peak systolic                   79                68 ECA peak systolic                   79                78 ICA peak systolic                   58                74 ICA end diastolic                   18                29 PLAQUE MORPHOLOGY:                  Mixed             Mixed PLAQUE AMOUNT:                      Mild              Mild PLAQUE LOCATION:                    ICA/ECA           ICA/ECA/CCA  IMPRESSION:  No hemodynamically significant stenosis of the bilateral internal carotid arteries with plaque formations as described above.  ___________________________________________ V. [name], MD  CH/MEDQ  D:  05/20/2011  T:  05/20/2011  Job:  [id number]

## 2011-06-06 NOTE — Procedures (Unsigned)
VASCULAR LAB EXAM  INDICATION:  Preoperative evaluation.  HISTORY:  EXAM:  Bilateral lower extremity vein mapping.  IMPRESSION:  The right greater saphenous vein is compressible with diameter measurements ranging from 0.28-0.44 cm. The right lesser saphenous vein is compressible with diameter measurements ranging from 0.37-0.4 cm. The left greater saphenous vein is compressible with diameter measurements ranging from 0.23-0.49 cm. The left lesser saphenous vein is compressible with diameter measurements ranging from 0.33-0.37 cm.  ___________________________________________ V. [name], MD  CH/MEDQ  D:  05/20/2011  T:  05/20/2011  Job:  [id number]

## 2011-06-07 LAB — CBC
HCT: 30.9 % — ABNORMAL LOW (ref 36.0–46.0)
Hemoglobin: 10.1 g/dL — ABNORMAL LOW (ref 12.0–15.0)
MCHC: 32.7 g/dL (ref 30.0–36.0)
MCV: 82 fL (ref 78.0–100.0)
RDW: 12.4 % (ref 11.5–15.5)

## 2011-06-11 ENCOUNTER — Emergency Department (HOSPITAL_COMMUNITY)
Admission: EM | Admit: 2011-06-11 | Discharge: 2011-06-11 | Disposition: A | Discharge status: Home / Self Care | Payer: Medicaid Other | Attending: Emergency Medicine | Admitting: Emergency Medicine

## 2011-06-11 DIAGNOSIS — I998 Other disorder of circulatory system: Secondary | ICD-10-CM | POA: Insufficient documentation

## 2011-06-11 DIAGNOSIS — E119 Type 2 diabetes mellitus without complications: Secondary | ICD-10-CM | POA: Insufficient documentation

## 2011-06-11 DIAGNOSIS — Z79899 Other long term (current) drug therapy: Secondary | ICD-10-CM | POA: Insufficient documentation

## 2011-06-11 DIAGNOSIS — M7989 Other specified soft tissue disorders: Secondary | ICD-10-CM | POA: Insufficient documentation

## 2011-06-11 DIAGNOSIS — I739 Peripheral vascular disease, unspecified: Secondary | ICD-10-CM

## 2011-06-11 DIAGNOSIS — F17200 Nicotine dependence, unspecified, uncomplicated: Secondary | ICD-10-CM | POA: Insufficient documentation

## 2011-06-11 DIAGNOSIS — I10 Essential (primary) hypertension: Secondary | ICD-10-CM | POA: Insufficient documentation

## 2011-06-11 DIAGNOSIS — M79609 Pain in unspecified limb: Secondary | ICD-10-CM

## 2011-06-11 LAB — DIFFERENTIAL
Eosinophils Absolute: 0.1 10*3/uL (ref 0.0–0.7)
Eosinophils Relative: 1 % (ref 0–5)
Lymphocytes Relative: 35 % (ref 12–46)
Lymphs Abs: 3.8 10*3/uL (ref 0.7–4.0)
Monocytes Absolute: 0.8 10*3/uL (ref 0.1–1.0)
Monocytes Relative: 8 % (ref 3–12)

## 2011-06-11 LAB — COMPREHENSIVE METABOLIC PANEL
AST: 9 U/L (ref 0–37)
Albumin: 3.2 g/dL — ABNORMAL LOW (ref 3.5–5.2)
BUN: 11 mg/dL (ref 6–23)
CO2: 26 mEq/L (ref 19–32)
Calcium: 9.9 mg/dL (ref 8.4–10.5)
Chloride: 103 mEq/L (ref 96–112)
Creatinine, Ser: 0.68 mg/dL (ref 0.50–1.10)
GFR calc non Af Amer: >60 mL/min (ref >60)
Total Bilirubin: 0.3 mg/dL (ref 0.3–1.2)

## 2011-06-11 LAB — CBC
HCT: 33.1 % — ABNORMAL LOW (ref 36.0–46.0)
MCH: 26.9 pg (ref 26.0–34.0)
MCV: 81.7 fL (ref 78.0–100.0)
Platelets: 428 10*3/uL — ABNORMAL HIGH (ref 150–400)
RDW: 12.4 % (ref 11.5–15.5)

## 2011-06-18 NOTE — Op Note (Signed)
NAMEADELEIGH, HOSAKA                 ACCOUNT NO.:  [id number]  MEDICAL RECORD NO.:  [id number]  LOCATION:  [id number]                         FACILITY:  [facility]  PHYSICIAN:  Theotis Burrow IV, MDDATE OF BIRTH:  23-Jul-1951  DATE OF PROCEDURE:  05/27/2011 DATE OF DISCHARGE:                              OPERATIVE REPORT   PREOPERATIVE DIAGNOSIS:  Ischemic right toe.  POSTOPERATIVE DIAGNOSIS:  Ischemic right toe.  PROCEDURE PERFORMED:  Right distal superficial femoral-to-below-knee popliteal bypass graft with reversed ipsilateral greater saphenous vein.  SURGEON: 1. [name], M.D.  ASSISTANT:  Evorn Gong, PA.  BLOOD LOSS:  Minimal.  FINDINGS:  Proximal anastomosis was end to side.  I did take down part of the adductor canal.  The distal anastomosis was to the below-knee popliteal artery at the level of the anterior tibial artery.  SPECIMENS:  None.  DRAINS:  None.  INDICATIONS:  This is a 60 year old female who presented with ischemic pain to her right toe.  She underwent arteriogram, which showed popliteal artery occlusion.  She comes in today for bypass.  I discussed via telephone the details of the procedures as well as risks and benefits.  I have also previously discussed this with her family after her angiogram last week.  All of her questions were answered.  Informed consent was obtained.  PROCEDURE:  The patient was identified in the holding area, taken to room #6, and placed supine on the table.  General endotracheal anesthesia was administered.  The patient was prepped and draped in usual fashion.  Time-out was called.  Ultrasound was used to map the greater saphenous vein and the leg.  It appeared to be adequate from the knee proximal and below-the-knee, there are multiple branches of the vein, were small.  After the patient was prepped and draped and antibiotics were administered, I began with a below-knee incision.  Cautery was used to divide  subcutaneous tissue, exposed the greater saphenous vein through this incision, it was very small.  I did not feel like it was a useful conduit.  The fascia was then opened with the cautery.  The gastrocnemius muscle was reflected posteriorly.  I did take down the proximal portion of the soleus muscle from the tibia.  I exposed the popliteal neurovascular bundle.  The artery was dissected out, it was a soft artery.  Next, I made an incision in above the knee.  Through this incision, I exposed the greater saphenous vein.  The saphenous vein was still remained small in the distal part of the incision; however, it did dilate nicely in the proximal portion of the incision.  I then made several counter incisions of the leg to harvest the vein.  The side branches were ligated between silk ties and metal clips.  Once I thought adequate vein was exposed, I began exposing with the superficial femoral artery.  The fascia was then opened sharply and the popliteal space was entered bluntly.  I did take down the distal portion of the adductor canal to get to where the artery was patent.  Once I had adequate exposure, I created a tunnel between the two incisions.  An umbilical tape was  passed.  At this point, the patient was fully heparinized.  The vein was then removed from its anatomic position.  I ligated both ends with 2-0 silk ties.  The vein was then prepared on the back table and it distended nicely to about 3.5 mm.  A Webril was placed in the proximal thigh, followed by tourniquet and an Esmarch was used to exsanguinate the leg.  After the heparin had fully circulated, the tourniquet was taken to 300 mm of pressure.  I made an arteriotomy in the distal superficial femoral artery, which was extended with Potts scissors.  The vein was then placed in reverse fashion.  It had been marked to ensure proper orientation.  It was spatulated to fit the size of the arteriotomy.  I did pass a Fogarty  catheter up the artery to ensure that it was patent.  The Fogarty did not meet resistance.  An end-to-side anastomosis was then created with running 6-0 Prolene.  Prior to completion of the anastomosis, the tourniquet was let down and appropriate flush maneuvers were performed.  The anastomosis was then completed.  There was excellent pulsatile flow through the vein graft. The vein was then brought through the previously created tunnel.  I then reinflated the tourniquet. An #11 blade was used to make an arteriotomy in the below-knee popliteal artery which was extended longitudinally with Potts scissors.  The leg was straightened and the vein was cut to the appropriate length.  It was then spatulated to fit the size of the arteriotomy.  A running end-to-side anastomosis was then created with 6- 0 Prolene.  Prior to completion, the tourniquet was let down.  The appropriate flush maneuvers were performed, and the anastomosis was completed.  There was an excellent pulse within the graft.  The patient had excellent distal signals that were triphasic that became barely audible with graft compression.  I elected not to shoot an arteriogram. The patient's heparin was then reversed with 50 mg of protamine.  Once I was satisfied with hemostasis, the vein harvest incisions were closed with two layers of 3-0 Vicryl.  The above- and below-knee incisions were closed by reapproximating the fascia with 2-0 Vicryl and the subcutaneous tissue with 3-0 Vicryl and the skin with 4-0 Vicryl. Dermabond was placed on the wound.  The patient tolerated procedure well, and there were no complications.     [name], MD     VWB/MEDQ  D:  05/27/2011  T:  05/27/2011  Job:  [id number]  Electronically Signed by Orvan Falconer IV MD on 06/18/2011 09:58:02 AM

## 2011-06-18 NOTE — Discharge Summary (Signed)
Jenna Brown, Jenna Brown                 ACCOUNT NO.:  [id number]  MEDICAL RECORD NO.:  [id number]  LOCATION:  2032                         FACILITY:  [facility]  PHYSICIAN:  Theotis Burrow IV, MDDATE OF BIRTH:  1950-10-06  DATE OF ADMISSION:  05/27/2011 DATE OF DISCHARGE:  06/07/2011                              DISCHARGE SUMMARY   ADMISSION DIAGNOSIS:  Ischemic right toe.  HISTORY OF PRESENT ILLNESS:  This is a 60 year old female who presented with ischemic pain to her right toe.  She underwent arteriogram which revealed popliteal artery occlusion.  She comes in for bypass grafting.  HOSPITAL COURSE:  The patient was admitted to the hospital and taken to the operating room on May 27, 2011 where she underwent a right distal superficial femoral to below-knee popliteal bypass graft with reverse to the lateral greater saphenous vein.  She tolerated procedure well and was transported to the recovery room in satisfactory condition. The night of surgery, the nurse reported it was very difficult to hear the right dorsalis pedal pulse with Doppler.  By postoperative day #1, the patient states that she was feeling much better.  Dr. Kellie Simmering was able to get a Doppler signal, however, she was kept in 3300 and placed on heparin drip.  She did have ABIs done on postoperative day #1.  Preop was 0.64 on the right and postop was 0.46 on the right.  The patient did have Doppler flow and she was without complaints.  The patient was continued on heparin and transported to the telemetry floor that day. By postoperative day #3, she was having monophasic flow to the right pedal pulses.  Dr. [name] did order a CTA to evaluate patency of her graft.  The CTA revealed that the bypass graft was occluded and etiology was unknown.  Dr. [name] planned a redo bypass graft.  On May 31, 2011, the patient was taken back to the operating room for an occluded right distal femoral to below-knee popliteal artery  bypass graft.  She underwent a redo right distal superficial femoral to below-knee popliteal artery bypass graft with 6 mm for patent Gore-Tex graft.  She also had an intraoperative arteriogram.  She tolerated procedure well and was transported to the recovery room in satisfactory condition.  By postoperative day 4/1, the patient was without complaints.  However, she did have productive cough with yellow sputum.  She was started on a Z- Pak at that time.  She was also continued on heparin drip at that time. By postoperative day 6/3, the patient's right leg was warm with a palpable dorsalis pedal pulse on the right.  Her groin incision had a very slight dehiscence but no drainage.  By this day, her heparin drip was discontinued and she was started on Plavix and aspirin.  Her ABIs after her second operation were greater than 1 on the right and 0.80 on the left.  Physical therapy continued to work with the patient.  They did recommend the patient be discharged to home with home health.  She was continued on antibiotics for her right groin wound for questionable infection.  Otherwise her postoperative course included increasing ambulation as well as increasing intake  of solids without difficulty.  DISCHARGE INSTRUCTIONS:  She is discharged to home with extensive instructions on wound care and progressive ambulation.  She is instructed not to drive or perform any heavy lifting until returning to see Dr. [name] in his office.  DISCHARGE DIAGNOSES: 1. Ischemic right toe.     a.     Status post right distal superficial femoral to below-knee      popliteal bypass on May 27, 2011. 2. Occluded right distal femoral to below-knee popliteal artery bypass     graft.     a.     Status post redo right distal superficial femoral to below-      knee popliteal artery bypass grafting with Gore-Tex May 31, 2011. 3. Diabetes. 4. Hypertension. 5. Hyperlipidemia. 6. History of myocardial  infarction. 7. Peripheral vascular disease. 8. History of transient ischemic attack. 9. Tobacco use.  DISCHARGE MEDICATIONS: 1. Aspirin 81 mg p.o. daily. 2. Oxycodone 5 mg 1-2 tablets p.o. q.4-6 hours p.r.n. pain, #30, no     refill. 3. Plavix 75 mg p.o. daily. 4. Advil 2 tablets p.o. daily p.r.n. 5. Alprazolam 0.25 mg p.o. daily p.r.n. 6. Glyburide/metformin 5/500, 2 tablets p.o. b.i.d. 7. Meclizine 25 mg p.o. daily p.r.n. 8. Toprol-XL 100 mg p.o. daily.  FOLLOWUP:  The patient is to follow up with Dr. [name] in 2 weeks.     Evorn Gong, PA   ______________________________ V. [name], MD    SE/MEDQ  D:  06/15/2011  T:  06/15/2011  Job:  [id number]  Electronically Signed by Evorn Gong PA on 06/17/2011 09:02:37 AM Electronically Signed by Orvan Falconer IV MD on 06/18/2011 09:58:17 AM

## 2011-06-18 NOTE — Op Note (Signed)
Jenna Brown, Jenna Brown                 ACCOUNT NO.:  [id number]  MEDICAL RECORD NO.:  [id number]  LOCATION:  2032                         FACILITY:  [facility]  PHYSICIAN:  Theotis Burrow IV, MDDATE OF BIRTH:  [birth year]  DATE OF PROCEDURE:  05/31/2011 DATE OF DISCHARGE:                              OPERATIVE REPORT   PREOPERATIVE DIAGNOSIS:  Occluded right distal femoral to below-knee popliteal artery bypass graft.  POSTOPERATIVE DIAGNOSIS:  Occluded right distal femoral to below-knee popliteal artery bypass graft.  PROCEDURE PERFORMED: 1. Redo right distal superficial femoral to below-knee popliteal     artery bypass graft with 6-mm Propaten Gore-Tex graft. 2. Intraoperative arteriogram  SURGEON: 1. [name], MD  ASSISTANT:  Evorn Gong, PA  ANESTHESIA:  General.  BLOOD LOSS:  Minimal.  FINDINGS:  Excellent palpable posterior tibial and dorsalis pedis pulse at the end of the case.  INDICATIONS:  This is a 60 year old female who recently underwent distal superficial femoral to below-knee popliteal bypass graft with reversed ipsilateral saphenous vein.  This occluded over the weekend.  The patient was mild minimal asymptomatic.  She initially had her operation for ischemia to her right toe.  Her vein was adequate when I harvested it, however, my suspicion is that since her graft is gone down is that the conduit was the problem, and therefore I recommended coming back to replace this with Gore-Tex.  I discussed this at length with the patient and her family, and she wished to proceed.  DESCRIPTION OF PROCEDURE:  The patient was identified in the holding and taken to room 6, placed supine on the table.  General anesthesia was administered.  The patient was prepped and draped in usual fashion. Time-out was called.  The patient's above and below-knee incisions were opened with a 10 blade.  Sutures were cut and exposure of the artery was done relatively easily.   The vein graft was obviously thrombosed.  I elected to place a tourniquet in the thigh.  Before doing this, the patient was fully heparinized.  The leg was exsanguinated with an Esmarch and the tourniquet was then placed up to 300 mm of pressure.  I then used an 11 blade to take down the proximal anastomosis.  This was fully thrombosed.  I did extend the arteriotomy further proximal.  I brought a 6-mm Gore-Tex graft on the field, spatulated this, and sewed, and did an end-to-side anastomosis with CV6 Gore suture.  The tourniquet was then let down.  There was excellent pulsatile flow through the graft.  The tourniquet was then reinflated after exsanguinating the leg with an Esmarch.  The Propaten graft was brought through the previous tunnel.  I then used 11 blade to take down the distal anastomosis.  All thrombus was evacuated, I was easily able to pass a Fogarty all the way down across the ankle.  I then cut the Gore-Tex to the appropriate length and did an end-to-side anastomosis with Gore-Tex suture.  Prior to completion, the tourniquet was taken down.  Appropriate flush maneuvers were performed.  The anastomosis was completed.  I then shot an intraoperative arteriogram which showed patent bypass graft with 3- vessel runoff, the  dominant being the posterior tibial.  At this point, I elected to close.  The patient's heparin was left running.  Hemostasis was achieved.  Deep tissues were closed with 2-0 Vicryl.  Subcutaneous tissue was closed with 3-0 Vicryl and skin was closed with 4-0 Vicryl. Dermabond was placed.  The patient was successfully extubated and taken to recovery room in stable condition.  There were no complications.  I did inspect the vein graft.  I did not see any obvious abnormality within the vein graft on the back table.     Jenna Abrahams, MD     V[facility]/MEDQ  D:  06/05/2011  T:  06/05/2011  Job:  [id number]  Electronically Signed by Orvan Falconer IV MD on  06/18/2011 09:58:09 AM

## 2011-06-21 ENCOUNTER — Encounter: Payer: Self-pay | Admitting: Surgery

## 2011-06-22 ENCOUNTER — Encounter: Payer: Self-pay | Admitting: Vascular Surgery

## 2011-06-23 ENCOUNTER — Encounter: Payer: Self-pay | Admitting: Vascular Surgery

## 2011-06-23 ENCOUNTER — Ambulatory Visit (INDEPENDENT_AMBULATORY_CARE_PROVIDER_SITE_OTHER): Payer: Self-pay | Admitting: Vascular Surgery

## 2011-06-23 VITALS — BP 142/83 | HR 85 | Resp 16 | Ht 64.0 in | Wt 164.7 lb

## 2011-06-23 DIAGNOSIS — I872 Venous insufficiency (chronic) (peripheral): Secondary | ICD-10-CM

## 2011-06-23 DIAGNOSIS — I96 Gangrene, not elsewhere classified: Secondary | ICD-10-CM

## 2011-06-23 MED ORDER — OXYCODONE-ACETAMINOPHEN 5-325 MG PO TABS: 1.0000 | ORAL_TABLET | ORAL | Status: DC | PRN

## 2011-06-23 NOTE — Progress Notes (Signed)
[facility] [place] HISTORY AND PHYSICAL   History of Present Illness:  Patient is a 59 y.o. year old female who presents for evaluation of her right second toe. She previously underwent a right superficial femoral artery to below-knee popliteal bypass with vein by Dr. [name] on September 8. She subsequently required redo of this bypass with a propaten graft on September 16. She was scheduled for postoperative followup with Dr. [name] next Monday. However, her home health nurse thought that her toe was getting worse and referred her today for further evaluation.  Patient states she has intermittent pain in the right second toe. She has no fevers or drainage.  Past Medical History  Diagnosis Date  . Diabetes mellitus   . Hypertension   . Hyperlipidemia   . Myocardial infarction 1990's  . Peripheral vascular disease   . Stroke     TIA history    Past Surgical History  Procedure Date  . Cleft palate repair      Social History History  Substance Use Topics  . Smoking status: Current Everyday Smoker -- 0.2 packs/day for 40 years    Types: Cigarettes  . Smokeless tobacco: Not on file  . Alcohol Use: No    Family History Family History  Problem Relation Age of Onset  . Cancer Mother   . Diabetes Father     Allergies  Allergies  Allergen Reactions  . Codeine Other (See Comments)    "makes me feel strange"  . Tylenol (Acetaminophen) Other (See Comments)    "Doesn't feel right"      Current Outpatient Prescriptions  Medication Sig Dispense Refill  . ALPRAZolam (XANAX) 0.25 MG tablet Take 0.25 mg by mouth 3 (three) times daily as needed.        . glyBURIDE-metformin (GLUCOVANCE) 5-500 MG per tablet Take 1 tablet by mouth 2 (two) times daily with a meal.        . metoprolol (TOPROL-XL) 100 MG 24 hr tablet Take 100 mg by mouth daily.        . OxyCODONE HCl, Abuse Deter, 5 MG TABS Take by mouth as needed.          Physical Examination  Filed  Vitals:   06/23/11 1346  BP: 142/83  Pulse: 85  Resp: 16  Height: 5\' 4"  (1.626 m)  Weight: 164 lb 11.2 oz (74.707 kg)    Body mass index is 28.27 kg/(m^2).  General:  Alert and oriented, no acute distress Extremity Pulses:  2+ right popliteal with one plus DP and PT pulse, healing incisions, gangrene tip of 2nd toe no erythema or drainage  ASSESSMENT: Patent right superficial femoral to below-knee popliteal artery bypass with gangrenous right second toe. I discussed with the patient that possibility of an dictating a right second toe for pain control. However she wishes conservative management for now with pain medication alone and continue observation.   PLAN: She will return in 3-4 weeks for Dr. [name] to further evaluate her right second toe does see whether or not this will require amputation will heal by conservative measures

## 2011-06-27 ENCOUNTER — Ambulatory Visit: Payer: Self-pay | Admitting: Surgery

## 2011-06-30 ENCOUNTER — Ambulatory Visit (INDEPENDENT_AMBULATORY_CARE_PROVIDER_SITE_OTHER): Payer: Self-pay | Admitting: Physician Assistant

## 2011-06-30 ENCOUNTER — Encounter: Payer: Self-pay | Admitting: Physician Assistant

## 2011-06-30 VITALS — BP 154/76 | HR 96 | Resp 20 | Ht 64.0 in | Wt 154.0 lb

## 2011-06-30 DIAGNOSIS — I739 Peripheral vascular disease, unspecified: Secondary | ICD-10-CM

## 2011-06-30 NOTE — Progress Notes (Signed)
History of Present Illness:  Patient is a 60 y.o. year old female who presents for evaluation of her right second toe. She previously underwent a right superficial femoral artery to below-knee popliteal bypass with vein by Dr. [name] on September 8. She subsequently required redo of this bypass with a propaten graft on September 16. She was scheduled for postoperative followup with Dr. [name] in the near future, however, her home health nurse thought that her toe was getting worse and referred her last Thurs for further evaluation.  Patient stated she had intermittent pain in the right second toe without fevers or drainage.  Dr Rolanda Lundborg the pt and felt that she had dry gangrene which was stable and the pt wished to continue with conservative care and pain management.  She was scheduled to f/u with [facility] in 3-4 weeks.    Today, the [facility] again called requested eval for "changes in right second toe".  Pt states that she thinks her toe looks better and she has decreased pain.  She denies F/C, drainage, and erythema.  She is without complaint.  Past Medical History  Diagnosis Date  . Diabetes mellitus   . Hypertension   . Hyperlipidemia   . Myocardial infarction 1990's  . Peripheral vascular disease   . Stroke     TIA history   Past Surgical History  Procedure Date  . Cleft palate repair      History  Substance Use Topics  . Smoking status: Current Everyday Smoker -- 0.2 packs/day for 40 years    Types: Cigarettes  . Smokeless tobacco: Never Used  . Alcohol Use: No    Family History  Problem Relation Age of Onset  . Cancer Mother   . Diabetes Father     Allergies  Allergen Reactions  . Codeine Other (See Comments)    "makes me feel strange"  . Tylenol (Acetaminophen) Other (See Comments)    "Doesn't feel right"     Current Outpatient Prescriptions  Medication Sig Dispense Refill  . ALPRAZolam (XANAX) 0.25 MG tablet Take 0.25 mg by mouth 3 (three) times daily as needed.         . glyBURIDE-metformin (GLUCOVANCE) 5-500 MG per tablet Take 1 tablet by mouth 2 (two) times daily with a meal.        . metoprolol (TOPROL-XL) 100 MG 24 hr tablet Take 100 mg by mouth daily.        . OxyCODONE HCl, Abuse Deter, 5 MG TABS Take by mouth as needed.        Marland Kitchen oxyCODONE-acetaminophen (ROXICET) 5-325 MG per tablet Take 1 tablet by mouth every 4 (four) hours as needed for pain.  20 tablet  0   PE:  Filed Vitals:   06/30/11 1451  Height: 5\' 4"  (1.626 m)  Weight: 154 lb (69.854 kg)   Right LE incisions are healing well.  No erythema.  Right 2nd toe has dry gangrene present on the tip which extends to the second joint on the plantar aspect.  No erythema or increased heat.  No drainage.  Dry skin noted on all toes.  Foot warm.  Palp right DP and PT  A/P Pt continues to remain stable with dry gangrene of right second toe.  Keep scheduled f/u with Dr. [name] in 3-4 weeks.  Call prn  Clinic MD: Oneida Alar

## 2011-07-11 ENCOUNTER — Encounter: Payer: Self-pay | Admitting: Surgery

## 2011-07-11 ENCOUNTER — Ambulatory Visit (INDEPENDENT_AMBULATORY_CARE_PROVIDER_SITE_OTHER): Payer: Self-pay | Admitting: Surgery

## 2011-07-11 VITALS — BP 168/83 | HR 91 | Resp 24 | Ht 64.0 in | Wt 168.4 lb

## 2011-07-11 DIAGNOSIS — I70219 Atherosclerosis of native arteries of extremities with intermittent claudication, unspecified extremity: Secondary | ICD-10-CM

## 2011-07-11 NOTE — Progress Notes (Signed)
The patient comes back today for followup. She underwent a right distal superficial femoral to below knee popliteal bypass graft. Initially this procedure was done with ipsilateral vein on September 7 however this occluded early and she was taken back on September 11 and had this procedure done with Gore-Tex. We have been following her for ischemic changes to her second toe. She is back today for followup she states that her leg feels much better than has in the past she has feeling in the toe. She denies fevers or chills.  On examination she has a palpable dorsalis pedis pulse. All of her surgical incisions have healed. There are dry gangrene changes to the second toe without evidence of infection.  Since it has been several weeks I think we can continue following her toe to see if it will also amputated. I did discuss the possibility of proceeding with amputation I suggested that if we are going to proceed with amputation we one in nature this is done in the window of time of her bypass graft continues to function I will plan on a regular come back to see me in 6 weeks at that time we will get her first surveillance duplex ultrasound of her bypass graft. Also evaluate her toe and make a decision at that pointas to whether to continue with observation are to proceed with toe aputation

## 2011-08-03 ENCOUNTER — Encounter: Payer: Self-pay | Admitting: *Deleted

## 2011-08-19 ENCOUNTER — Encounter: Payer: Self-pay | Admitting: Surgery

## 2011-08-22 ENCOUNTER — Ambulatory Visit (INDEPENDENT_AMBULATORY_CARE_PROVIDER_SITE_OTHER): Payer: Self-pay | Admitting: Surgery

## 2011-08-22 ENCOUNTER — Ambulatory Visit (INDEPENDENT_AMBULATORY_CARE_PROVIDER_SITE_OTHER): Payer: Self-pay | Admitting: *Deleted

## 2011-08-22 ENCOUNTER — Other Ambulatory Visit (INDEPENDENT_AMBULATORY_CARE_PROVIDER_SITE_OTHER): Payer: Self-pay | Admitting: *Deleted

## 2011-08-22 ENCOUNTER — Encounter: Payer: Self-pay | Admitting: Surgery

## 2011-08-22 ENCOUNTER — Other Ambulatory Visit: Payer: Self-pay

## 2011-08-22 VITALS — BP 180/110 | HR 79 | Resp 16 | Ht 64.0 in | Wt 164.0 lb

## 2011-08-22 DIAGNOSIS — Z48812 Encounter for surgical aftercare following surgery on the circulatory system: Secondary | ICD-10-CM

## 2011-08-22 DIAGNOSIS — I739 Peripheral vascular disease, unspecified: Secondary | ICD-10-CM

## 2011-08-22 NOTE — Progress Notes (Signed)
The patient is status post right superficial femoral to below knee popliteal artery bypass graft. This was initially done with vein however this occluded postoperatively and was converted to Gore-Tex. This was done in the setting of a second toe ulcer. I been following her ulcer is been dry. The tip of her toe has dry gangrene. We have offered her a dictation versus observation she has elected to proceed with continued observation she is back today for followup she complains of occasional pain in the toe which prohibits her from wearing shoes. She is not having any fevers. There is no drainage.  Ultrasound was performed today this shows an ABI of 1.1 on the right with triphasic waveforms. There are mildly elevated velocities within the proximal anastomosis. Measurements are 226 cm/s.  Again I offered the option of doing dictation versus continued observation. At this point we are going to continue to observe this toe hopefully it will auto amputate. I am scheduling her to come back in 3 months with a repeat ultrasound duplex

## 2011-08-23 ENCOUNTER — Encounter: Payer: Self-pay | Admitting: *Deleted

## 2011-10-12 NOTE — Procedures (Unsigned)
BYPASS GRAFT EVALUATION  INDICATION:  Followup right bypass graft.  HISTORY: Diabetes:  Yes Cardiac:  No Hypertension:  Yes Smoking: Previous Surgery:  Right distal SFA to below knee popliteal artery bypass 05/27/2011 and redone using Gore-Tex 05/31/2011  SINGLE LEVEL ARTERIAL EXAM                              RIGHT              LEFT Brachial:                    189                185 Anterior tibial:             215                183 Posterior tibial:            201                173 Peroneal: Ankle/brachial index:        1.14               0.97  PREVIOUS ABI:  Date:  05/09/2011  RIGHT:  0.64  LEFT:  0.96  LOWER EXTREMITY BYPASS GRAFT DUPLEX EXAM:  DUPLEX:  Patent right distal superficial femoral artery to below knee popliteal artery bypass graft with an elevated velocity of 226 cm/s noted at the proximal anastomosis.  IMPRESSION: 1. Patent right distal superficial femoral artery to below knee     popliteal artery bypass with velocity measurements shown on the     following worksheet. 2. Ankle brachial indices are within normal limits bilaterally.  ___________________________________________ Nelda Severe Kellie Simmering, M.D.  EM/MEDQ  D:  08/22/2011  T:  08/22/2011  Job:  [id number]

## 2011-11-18 ENCOUNTER — Encounter: Payer: Self-pay | Admitting: Surgery

## 2011-11-21 ENCOUNTER — Encounter (INDEPENDENT_AMBULATORY_CARE_PROVIDER_SITE_OTHER): Payer: Medicaid Other | Admitting: *Deleted

## 2011-11-21 ENCOUNTER — Ambulatory Visit (INDEPENDENT_AMBULATORY_CARE_PROVIDER_SITE_OTHER): Payer: Medicaid Other | Admitting: Surgery

## 2011-11-21 ENCOUNTER — Encounter: Payer: Self-pay | Admitting: Surgery

## 2011-11-21 VITALS — BP 145/71 | HR 79 | Resp 16 | Ht 64.0 in | Wt 168.0 lb

## 2011-11-21 DIAGNOSIS — I739 Peripheral vascular disease, unspecified: Secondary | ICD-10-CM

## 2011-11-21 NOTE — Progress Notes (Signed)
[facility] [place]   Patient name: Jenna Brown MRN: [id number] DOB: [birth year] Sex: female     Chief Complaint  Patient presents with  . PVD    3 month f/up w/Labs - ABI and Lower Arterial doppler-05/27/11    HISTORY OF PRESENT ILLNESS: The patient is here today for followup. She is status post right superficial femoral to below knee popliteal artery bypass graft. This was initially done on 05/27/2011 for an ischemic second toe. This was initially done with vein. It occluded early and on 05/31/2011 she went back and had her bypass graft done with Gore-Tex. I have been following her for ischemic changes and dry gangrene to the right great toe. She has been offered amputation however and wished to have this followed up in that it would auto amputate. She is back today without complaints except that she does have some pain on the tip of her second toe.  Past Medical History  Diagnosis Date  . Diabetes mellitus   . Hypertension   . Hyperlipidemia   . Myocardial infarction 1990's  . Peripheral vascular disease   . Stroke     TIA history    Past Surgical History  Procedure Date  . Cleft palate repair   . Pr vein bypass graft,aorto-fem-pop 05/27/11    Right SFA-Below knee Pop BP    History   Social History  . Marital Status: Single    Spouse Name: N/A    Number of Children: N/A  . Years of Education: N/A   Occupational History  . Not on file.   Social History Main Topics  . Smoking status: Current Everyday Smoker -- 0.2 packs/day for 40 years    Types: Cigarettes  . Smokeless tobacco: Never Used  . Alcohol Use: No  . Drug Use: No  . Sexually Active: Not on file   Other Topics Concern  . Not on file   Social History Narrative  . No narrative on file    Family History  Problem Relation Age of Onset  . Cancer Mother     breast  . Diabetes Father     Allergies as of 11/21/2011 - Review Complete 11/21/2011  Allergen Reaction Noted  .  Codeine Other (See Comments) 05/25/2011  . Tylenol (acetaminophen) Other (See Comments) 05/25/2011    Current Outpatient Prescriptions on File Prior to Visit  Medication Sig Dispense Refill  . ALPRAZolam (XANAX) 0.25 MG tablet Take 0.25 mg by mouth 3 (three) times daily as needed.        Marland Kitchen aspirin 81 MG tablet Take 81 mg by mouth daily.        . clopidogrel (PLAVIX) 75 MG tablet Take 75 mg by mouth daily.        Marland Kitchen glyBURIDE-metformin (GLUCOVANCE) 5-500 MG per tablet Take 1 tablet by mouth 2 (two) times daily with a meal.        . ibuprofen (ADVIL,MOTRIN) 200 MG tablet Take 400 mg by mouth every 6 (six) hours as needed.        . meclizine (ANTIVERT) 25 MG tablet Take 25 mg by mouth daily as needed.        . metoprolol (TOPROL-XL) 100 MG 24 hr tablet Take 100 mg by mouth daily.           REVIEW OF SYSTEMS: No changes since prior visit  PHYSICAL EXAMINATION:   Vital signs are BP 145/71  Pulse 79  Resp 16  Ht 5\' 4"  (1.626 m)  Wt 168 lb (76.204 kg)  BMI 28.84 kg/m2  SpO2 98% General: The patient appears their stated age. HEENT:  No gross abnormalities Pulmonary:  Non labored breathing Abdomen: Soft and non-tender Musculoskeletal: There are no major deformities. Neurologic: No focal weakness or paresthesias are detected, Skin: Right second toe ulceration. There was a very hard dry area of gangrene which I sharply debrided. This was essentially the distal third of her toe. It was removed there was a small wound approximately 2 x 2 mm which has yet to heal. No gross infection was encountered. Psychiatric: The patient has normal affect. Cardiovascular: There is a regular rate and rhythm without significant murmur appreciated.   Diagnostic Studies Duplex ultrasounds as a widely patent bypass graft ABI on the left is 1.0 on the right is 1.18  Assessment: Status post right femoral-popliteal bypass graft ischemic second toe Plan:  her toes debris then excised today. There was a small  wound which am recommending placing Triple Antibiotic ointment on and changing it once a day. I'm going to bring her back in one month to have this further evaluated. She will return in 3 months for followup duplex ultrasound. She does have velocities of 200 cm/s at her proximal anastomosis  V. [name], M.D. [facility] [place] Office: [phone] Pager:  [phone]

## 2011-11-28 NOTE — Procedures (Unsigned)
BYPASS GRAFT EVALUATION  INDICATION:  Right lower extremity bypass graft.  HISTORY: Diabetes:  Yes. Cardiac:  No. Hypertension:  Yes. Smoking:  No. Previous Surgery:  Right fem-pop bypass graft on 05/27/2011 with re-do on 05/31/2011.  SINGLE LEVEL ARTERIAL EXAM                              RIGHT              LEFT Brachial: Anterior tibial: Posterior tibial: Peroneal: Ankle/brachial index:  PREVIOUS ABI:  Date:  RIGHT:  LEFT:  LOWER EXTREMITY BYPASS GRAFT DUPLEX EXAM:  DUPLEX:  Triphasic Doppler waveforms noted throughout the right lower extremity bypass graft with a velocity of 201 cm/s noted near the right proximal anastomosis region.  IMPRESSION: 1. Patent right femoropopliteal bypass graft with maximum velocity, as     described above. 2. No significant change noted when compared to the previous     examination on 08/22/2011. 3. Bilateral ankle brachial indices are noted on a separate report.       ___________________________________________ V. [name], MD  CH/MEDQ  D:  11/22/2011  T:  11/22/2011  Job:  [id number]

## 2011-12-12 ENCOUNTER — Encounter: Payer: Self-pay | Admitting: *Deleted

## 2011-12-23 ENCOUNTER — Encounter: Payer: Self-pay | Admitting: Surgery

## 2011-12-26 ENCOUNTER — Encounter: Payer: Self-pay | Admitting: Surgery

## 2011-12-26 ENCOUNTER — Ambulatory Visit (INDEPENDENT_AMBULATORY_CARE_PROVIDER_SITE_OTHER): Payer: Medicaid Other | Admitting: Surgery

## 2011-12-26 VITALS — BP 126/68 | HR 92 | Temp 98.4°F | Ht 64.0 in | Wt 165.4 lb

## 2011-12-26 DIAGNOSIS — Z48812 Encounter for surgical aftercare following surgery on the circulatory system: Secondary | ICD-10-CM

## 2011-12-26 DIAGNOSIS — I70219 Atherosclerosis of native arteries of extremities with intermittent claudication, unspecified extremity: Secondary | ICD-10-CM | POA: Insufficient documentation

## 2011-12-26 NOTE — Progress Notes (Signed)
[facility] [place]   Patient name: Jenna Brown MRN: [id number] DOB: 08/10/1951 Sex: female     Chief Complaint  Patient presents with  . PVD    recheck toe wound    HISTORY OF PRESENT ILLNESS: The patient comes back today for followup. On 05/27/2011 she underwent right superficial femoral to below knee popliteal artery bypass graft in the setting of an ischemic right second toe. She suffered early occlusion and on 05/31/2011 her bypass graft was be done using Gore-Tex. I have been following her for ischemic changes with dry gangrene to the right second great toe. She has not wanted to proceed with amputation and therefore we have been providing wound care. She is back today for followup. She states that her toe has significantly improved. She is not having any pain. She is not doing any further dressing changes.  Past Medical History  Diagnosis Date  . Diabetes mellitus   . Hypertension   . Hyperlipidemia   . Myocardial infarction 1990's  . Peripheral vascular disease   . Stroke     TIA history    Past Surgical History  Procedure Date  . Cleft palate repair   . Pr vein bypass graft,aorto-fem-pop 05/27/11    Right SFA-Below knee Pop BP    History   Social History  . Marital Status: Single    Spouse Name: N/A    Number of Children: N/A  . Years of Education: N/A   Occupational History  . Not on file.   Social History Main Topics  . Smoking status: Current Everyday Smoker -- 0.1 packs/day for 40 years    Types: Cigarettes  . Smokeless tobacco: Never Used  . Alcohol Use: No  . Drug Use: No  . Sexually Active: Not on file   Other Topics Concern  . Not on file   Social History Narrative  . No narrative on file    Family History  Problem Relation Age of Onset  . Cancer Mother     breast  . Diabetes Father     Allergies as of 12/26/2011 - Review Complete 12/26/2011  Allergen Reaction Noted  . Codeine Other (See Comments) 05/25/2011    . Tylenol (acetaminophen) Other (See Comments) 05/25/2011    Current Outpatient Prescriptions on File Prior to Visit  Medication Sig Dispense Refill  . ALPRAZolam (XANAX) 0.25 MG tablet Take 0.25 mg by mouth 3 (three) times daily as needed.        Marland Kitchen aspirin 81 MG tablet Take 81 mg by mouth daily.        . clopidogrel (PLAVIX) 75 MG tablet Take 75 mg by mouth daily.        Marland Kitchen glyBURIDE-metformin (GLUCOVANCE) 5-500 MG per tablet Take 1 tablet by mouth 2 (two) times daily with a meal.        . ibuprofen (ADVIL,MOTRIN) 200 MG tablet Take 400 mg by mouth every 6 (six) hours as needed.        . meclizine (ANTIVERT) 25 MG tablet Take 25 mg by mouth daily as needed.        . metoprolol (TOPROL-XL) 100 MG 24 hr tablet Take 100 mg by mouth daily.           REVIEW OF SYSTEMS: Per the patient, no changes from prior visit  PHYSICAL EXAMINATION:   Vital signs are BP 126/68  Pulse 92  Temp(Src) 98.4 F (36.9 C) (Oral)  Ht 5\' 4"  (1.626 m)  Wt 165  lb 6.4 oz (75.025 kg)  BMI 28.39 kg/m2 General: The patient appears their stated age. HEENT:  No gross abnormalities Pulmonary:  Non labored breathing Musculoskeletal: There are no major deformities. Neurologic: No focal weakness or paresthesias are detected, Skin: There is just a thickened hypertrophic area on the tip of the second toe. The ischemic areas have resolved. Psychiatric: The patient has normal affect. Cardiovascular: There is a regular rate and rhythm without significant murmur appreciated. I cannot palpate a pedal pulse   Diagnostic Studies None  Assessment: Status post right distal SFA to below-knee popliteal artery bypass graft with Gore-Tex for an ischemic second toe Plan: Essentially the patient's toe has completely healed. I'm planning on seeing the patient back in 2 months with a duplex ultrasound. Her initial ultrasound showed velocities of 200 cm/s at the proximal anastomosis. I plan on reevaluating that area at that time.  Hopefully this is just to to inflammation in the postoperative period and this has improved.  [name], M.D. [facility] [place] Office: [phone] Pager:  [phone]

## 2011-12-27 NOTE — Progress Notes (Signed)
Addended by: Mena Goes on: 12/27/2011 08:26 AM   Modules accepted: Orders

## 2012-01-02 ENCOUNTER — Encounter: Payer: Self-pay | Admitting: *Deleted

## 2012-02-18 HISTORY — PX: OTHER SURGICAL HISTORY: SHX169

## 2012-02-24 ENCOUNTER — Encounter: Payer: Self-pay | Admitting: Surgery

## 2012-02-27 ENCOUNTER — Ambulatory Visit (INDEPENDENT_AMBULATORY_CARE_PROVIDER_SITE_OTHER): Payer: Medicaid Other | Admitting: *Deleted

## 2012-02-27 ENCOUNTER — Encounter: Payer: Self-pay | Admitting: *Deleted

## 2012-02-27 ENCOUNTER — Other Ambulatory Visit: Payer: Self-pay | Admitting: *Deleted

## 2012-02-27 ENCOUNTER — Encounter (INDEPENDENT_AMBULATORY_CARE_PROVIDER_SITE_OTHER): Payer: Medicaid Other | Admitting: *Deleted

## 2012-02-27 ENCOUNTER — Ambulatory Visit (INDEPENDENT_AMBULATORY_CARE_PROVIDER_SITE_OTHER): Payer: Medicaid Other | Admitting: Surgery

## 2012-02-27 ENCOUNTER — Encounter: Payer: Self-pay | Admitting: Surgery

## 2012-02-27 VITALS — BP 112/67 | HR 54 | Temp 98.5°F | Ht 64.0 in | Wt 160.0 lb

## 2012-02-27 DIAGNOSIS — Z48812 Encounter for surgical aftercare following surgery on the circulatory system: Secondary | ICD-10-CM

## 2012-02-27 DIAGNOSIS — I70229 Atherosclerosis of native arteries of extremities with rest pain, unspecified extremity: Secondary | ICD-10-CM

## 2012-02-27 DIAGNOSIS — I70219 Atherosclerosis of native arteries of extremities with intermittent claudication, unspecified extremity: Secondary | ICD-10-CM

## 2012-02-27 DIAGNOSIS — I739 Peripheral vascular disease, unspecified: Secondary | ICD-10-CM

## 2012-02-27 NOTE — Progress Notes (Signed)
[facility] [place]   Patient name: Jenna Brown MRN: [id number] DOB: Aug 25, 1951 Sex: female     Chief Complaint  Patient presents with  . PVD    3 month f/u     HISTORY OF PRESENT ILLNESS: The patient is here today for followup. In September of 2012 she underwent right superficial femoral to below knee popliteal artery bypass graft in the setting of an ischemic right second toe. She suffered early occlusion and went back to the operating room on 911 for a redo bypass using Gore-Tex. Her second toe eventually healed. She comes in today with a three-week history of tingling in her calf. She also has a recurrence of the ulceration on her right second toe. She continues to smoke.  Past Medical History  Diagnosis Date  . Diabetes mellitus   . Hypertension   . Hyperlipidemia   . Myocardial infarction 1990's  . Peripheral vascular disease   . Stroke     TIA history    Past Surgical History  Procedure Date  . Cleft palate repair   . Pr vein bypass graft,aorto-fem-pop 05/27/11    Right SFA-Below knee Pop BP    History   Social History  . Marital Status: Single    Spouse Name: N/A    Number of Children: N/A  . Years of Education: N/A   Occupational History  . Not on file.   Social History Main Topics  . Smoking status: Current Everyday Smoker -- 0.1 packs/day for 40 years    Types: Cigarettes  . Smokeless tobacco: Never Used   Comment: pt states that she knows she can quit  . Alcohol Use: No  . Drug Use: No  . Sexually Active: Not on file   Other Topics Concern  . Not on file   Social History Narrative  . No narrative on file    Family History  Problem Relation Age of Onset  . Cancer Mother     breast  . Diabetes Father     Allergies as of 02/27/2012 - Review Complete 02/27/2012  Allergen Reaction Noted  . Codeine Other (See Comments) 05/25/2011  . Tylenol (acetaminophen) Other (See Comments) 05/25/2011    Current Outpatient  Prescriptions on File Prior to Visit  Medication Sig Dispense Refill  . ALPRAZolam (XANAX) 0.25 MG tablet Take 0.25 mg by mouth 3 (three) times daily as needed.        Marland Kitchen amLODipine (NORVASC) 10 MG tablet Take 10 mg by mouth daily.      Marland Kitchen glyBURIDE-metformin (GLUCOVANCE) 5-500 MG per tablet Take 2 tablets by mouth 2 (two) times daily with a meal.       . hydrochlorothiazide (HYDRODIURIL) 25 MG tablet Take 25 mg by mouth daily.      Marland Kitchen ibuprofen (ADVIL,MOTRIN) 200 MG tablet Take 400 mg by mouth every 6 (six) hours as needed.        Marland Kitchen JANUVIA 100 MG tablet Take 100 mg by mouth daily.      Marland Kitchen losartan (COZAAR) 100 MG tablet Take 100 mg by mouth daily.      . simvastatin (ZOCOR) 40 MG tablet Take 40 mg by mouth daily.      Marland Kitchen aspirin 81 MG tablet Take 81 mg by mouth daily.        . clopidogrel (PLAVIX) 75 MG tablet Take 75 mg by mouth daily.        . meclizine (ANTIVERT) 25 MG tablet Take 25 mg by mouth  daily as needed.        . metoprolol (TOPROL-XL) 100 MG 24 hr tablet Take 100 mg by mouth daily.           REVIEW OF SYSTEMS: Please see history of present illness. Otherwise negative as per the encounter former  PHYSICAL EXAMINATION:   Vital signs are BP 112/67  Pulse 54  Temp(Src) 98.5 F (36.9 C) (Oral)  Ht 5\' 4"  (1.626 m)  Wt 160 lb (72.576 kg)  BMI 27.46 kg/m2  SpO2 100% General: The patient appears their stated age. HEENT:  No gross abnormalities Pulmonary:  Non labored breathing Musculoskeletal: There are no major deformities. Neurologic: No focal weakness or paresthesias are detected, Skin: Early ulceration to the tip of the right second toe Psychiatric: The patient has normal affect. Cardiovascular: Pedal pulses are not palpable   Diagnostic Studies Duplex ultrasound was reviewed and ordered. This shows occlusion of the right leg bypass graft. Her ankle-brachial index has decreased to 0.53, down from 1.1  Assessment: Right leg ulceration. Plan: Based on the patient's  symptoms as well as her ulcer and recent occlusion of her bypass graft, I feel that she needs to be reevaluated for lower extremity revascularization. I am scheduling her for an angiogram this coming Wednesday. The risks and benefits were discussed with the patient. I'll make formal recommendations based on these results.  [name], M.D. [facility] [place] Office: [phone] Pager:  [phone]

## 2012-02-28 ENCOUNTER — Encounter (HOSPITAL_COMMUNITY): Payer: Self-pay | Admitting: Pharmacy Technician

## 2012-02-28 MED ORDER — SODIUM CHLORIDE 0.9 % IV SOLN
INTRAVENOUS | Status: DC
  Administered 2012-02-29: 1000 mL via INTRAVENOUS

## 2012-02-29 ENCOUNTER — Other Ambulatory Visit: Payer: Self-pay | Admitting: *Deleted

## 2012-02-29 ENCOUNTER — Ambulatory Visit (HOSPITAL_COMMUNITY)
Admission: RE | Admit: 2012-02-29 | Discharge: 2012-02-29 | Disposition: A | Discharge status: Home / Self Care | Payer: Medicaid Other | Source: Ambulatory Visit | Attending: Surgery | Admitting: Surgery

## 2012-02-29 ENCOUNTER — Telehealth: Payer: Self-pay | Admitting: Surgery

## 2012-02-29 ENCOUNTER — Encounter (HOSPITAL_COMMUNITY)
Admission: RE | Disposition: A | Discharge status: Home / Self Care | Payer: Self-pay | Source: Ambulatory Visit | Attending: Surgery

## 2012-02-29 DIAGNOSIS — L98499 Non-pressure chronic ulcer of skin of other sites with unspecified severity: Secondary | ICD-10-CM

## 2012-02-29 DIAGNOSIS — I70219 Atherosclerosis of native arteries of extremities with intermittent claudication, unspecified extremity: Secondary | ICD-10-CM

## 2012-02-29 DIAGNOSIS — I739 Peripheral vascular disease, unspecified: Secondary | ICD-10-CM

## 2012-02-29 DIAGNOSIS — E785 Hyperlipidemia, unspecified: Secondary | ICD-10-CM | POA: Insufficient documentation

## 2012-02-29 DIAGNOSIS — I10 Essential (primary) hypertension: Secondary | ICD-10-CM | POA: Insufficient documentation

## 2012-02-29 DIAGNOSIS — E119 Type 2 diabetes mellitus without complications: Secondary | ICD-10-CM | POA: Insufficient documentation

## 2012-02-29 DIAGNOSIS — Z8673 Personal history of transient ischemic attack (TIA), and cerebral infarction without residual deficits: Secondary | ICD-10-CM | POA: Insufficient documentation

## 2012-02-29 DIAGNOSIS — Z01810 Encounter for preprocedural cardiovascular examination: Secondary | ICD-10-CM

## 2012-02-29 HISTORY — PX: ABDOMINAL AORTAGRAM: SHX5454

## 2012-02-29 LAB — POCT I-STAT, CHEM 8
BUN: 25 mg/dL — ABNORMAL HIGH (ref 6–23)
Chloride: 102 mEq/L (ref 96–112)
Creatinine, Ser: 0.9 mg/dL (ref 0.50–1.10)
Sodium: 139 mEq/L (ref 135–145)

## 2012-02-29 LAB — GLUCOSE, CAPILLARY: Glucose-Capillary: 175 mg/dL — ABNORMAL HIGH (ref 70–99)

## 2012-02-29 SURGERY — ABDOMINAL AORTAGRAM
Anesthesia: LOCAL

## 2012-02-29 MED ORDER — HEPARIN (PORCINE) IN NACL 2-0.9 UNIT/ML-% IJ SOLN
INTRAMUSCULAR | Status: AC
  Filled 2012-02-29: qty 1000

## 2012-02-29 MED ORDER — SODIUM CHLORIDE 0.9 % IV SOLN: 1.0000 mL/kg/h | INTRAVENOUS | Status: DC

## 2012-02-29 MED ORDER — MIDAZOLAM HCL 2 MG/2ML IJ SOLN
INTRAMUSCULAR | Status: AC
  Filled 2012-02-29: qty 2

## 2012-02-29 MED ORDER — LIDOCAINE HCL (PF) 1 % IJ SOLN
INTRAMUSCULAR | Status: AC
  Filled 2012-02-29: qty 30

## 2012-02-29 MED ORDER — FENTANYL CITRATE 0.05 MG/ML IJ SOLN
INTRAMUSCULAR | Status: AC
  Filled 2012-02-29: qty 2

## 2012-02-29 NOTE — Discharge Instructions (Signed)

## 2012-02-29 NOTE — Telephone Encounter (Addendum)
Message copied by Lujean Amel on Wed Feb 29, 2012 12:22 PM ------      Message from: Alfonso Patten      Created: Wed Feb 29, 2012 11:23 AM                   ----- Message -----         From: [name], MD         Sent: 02/29/2012  10:49 AM           To: Patrici Ranks, Alfonso Patten, RN            02/29/2012, the patient had the following procedures:             1.  ultrasound access left femoral artery       2.  abdominal aortogram       3.  bilateral lower extremity runoff       4.  second order catheterization                  Please schedule the patient to come back to see me in the office one week from Monday. She will need vein mapping of her left leg.  I scheduled an appt for followup of above patient for 03/19/12 at 9:30 am. I left message on pt's home ph # and mailed letter. Shawn Stall

## 2012-02-29 NOTE — Interval H&P Note (Signed)
History and Physical Interval Note:  02/29/2012 10:15 AM  Jenna Brown  has presented today for surgery, with the diagnosis of PVD  The various methods of treatment have been discussed with the patient and family. After consideration of risks, benefits and other options for treatment, the patient has consented to  Procedure(s) (LRB): ABDOMINAL AORTAGRAM (N/A) as a surgical intervention .  The patients' history has been reviewed, patient examined, no change in status, stable for surgery.  I have reviewed the patients' chart and labs.  Questions were answered to the patient's satisfaction.     Ulysses Alper IV, V. WELLS

## 2012-02-29 NOTE — H&P (View-Only) (Signed)
[facility] [place]   Patient name: Jenna Brown MRN: [id number] DOB: 1951-02-04 Sex: female     Chief Complaint  Patient presents with  . PVD    3 month f/u     HISTORY OF PRESENT ILLNESS: The patient is here today for followup. In September of 2012 she underwent right superficial femoral to below knee popliteal artery bypass graft in the setting of an ischemic right second toe. She suffered early occlusion and went back to the operating room on 911 for a redo bypass using Gore-Tex. Her second toe eventually healed. She comes in today with a three-week history of tingling in her calf. She also has a recurrence of the ulceration on her right second toe. She continues to smoke.  Past Medical History  Diagnosis Date  . Diabetes mellitus   . Hypertension   . Hyperlipidemia   . Myocardial infarction 1990's  . Peripheral vascular disease   . Stroke     TIA history    Past Surgical History  Procedure Date  . Cleft palate repair   . Pr vein bypass graft,aorto-fem-pop 05/27/11    Right SFA-Below knee Pop BP    History   Social History  . Marital Status: Single    Spouse Name: N/A    Number of Children: N/A  . Years of Education: N/A   Occupational History  . Not on file.   Social History Main Topics  . Smoking status: Current Everyday Smoker -- 0.1 packs/day for 40 years    Types: Cigarettes  . Smokeless tobacco: Never Used   Comment: pt states that she knows she can quit  . Alcohol Use: No  . Drug Use: No  . Sexually Active: Not on file   Other Topics Concern  . Not on file   Social History Narrative  . No narrative on file    Family History  Problem Relation Age of Onset  . Cancer Mother     breast  . Diabetes Father     Allergies as of 02/27/2012 - Review Complete 02/27/2012  Allergen Reaction Noted  . Codeine Other (See Comments) 05/25/2011  . Tylenol (acetaminophen) Other (See Comments) 05/25/2011    Current Outpatient  Prescriptions on File Prior to Visit  Medication Sig Dispense Refill  . ALPRAZolam (XANAX) 0.25 MG tablet Take 0.25 mg by mouth 3 (three) times daily as needed.        Marland Kitchen amLODipine (NORVASC) 10 MG tablet Take 10 mg by mouth daily.      Marland Kitchen glyBURIDE-metformin (GLUCOVANCE) 5-500 MG per tablet Take 2 tablets by mouth 2 (two) times daily with a meal.       . hydrochlorothiazide (HYDRODIURIL) 25 MG tablet Take 25 mg by mouth daily.      Marland Kitchen ibuprofen (ADVIL,MOTRIN) 200 MG tablet Take 400 mg by mouth every 6 (six) hours as needed.        Marland Kitchen JANUVIA 100 MG tablet Take 100 mg by mouth daily.      Marland Kitchen losartan (COZAAR) 100 MG tablet Take 100 mg by mouth daily.      . simvastatin (ZOCOR) 40 MG tablet Take 40 mg by mouth daily.      Marland Kitchen aspirin 81 MG tablet Take 81 mg by mouth daily.        . clopidogrel (PLAVIX) 75 MG tablet Take 75 mg by mouth daily.        . meclizine (ANTIVERT) 25 MG tablet Take 25 mg by mouth  daily as needed.        . metoprolol (TOPROL-XL) 100 MG 24 hr tablet Take 100 mg by mouth daily.           REVIEW OF SYSTEMS: Please see history of present illness. Otherwise negative as per the encounter former  PHYSICAL EXAMINATION:   Vital signs are BP 112/67  Pulse 54  Temp(Src) 98.5 F (36.9 C) (Oral)  Ht 5\' 4"  (1.626 m)  Wt 160 lb (72.576 kg)  BMI 27.46 kg/m2  SpO2 100% General: The patient appears their stated age. HEENT:  No gross abnormalities Pulmonary:  Non labored breathing Musculoskeletal: There are no major deformities. Neurologic: No focal weakness or paresthesias are detected, Skin: Early ulceration to the tip of the right second toe Psychiatric: The patient has normal affect. Cardiovascular: Pedal pulses are not palpable   Diagnostic Studies Duplex ultrasound was reviewed and ordered. This shows occlusion of the right leg bypass graft. Her ankle-brachial index has decreased to 0.53, down from 1.1  Assessment: Right leg ulceration. Plan: Based on the patient's  symptoms as well as her ulcer and recent occlusion of her bypass graft, I feel that she needs to be reevaluated for lower extremity revascularization. I am scheduling her for an angiogram this coming Wednesday. The risks and benefits were discussed with the patient. I'll make formal recommendations based on these results.  [name], M.D. [facility] [place] Office: [phone] Pager:  [phone]

## 2012-02-29 NOTE — Op Note (Signed)
[facility] [place]  Patient name: Jenna Brown MRN: [id number] DOB: 06/06/1951 Sex: female  02/29/2012 Pre-operative Diagnosis: Right leg ulcer Post-operative diagnosis:  Same Surgeon:  [name] Procedure Performed:  1.  ultrasound access left femoral artery  2.  abdominal aortogram  3.  bilateral lower extremity runoff  4.  second order catheterization    Indications:  The patient has a history of a above to below knee popliteal artery bypass graft initially with vein and then with Gore-Tex. This has now occluded. She has an ulcer on her toe. She comes in today for arteriogram.  Procedure:**  The patient was identified in the holding area and taken to room 8.  The patient was then placed supine on the table and prepped and draped in the usual sterile fashion.  A time out was called.  Ultrasound was used to evaluate the left common femoral artery.  It was patent .  A digital ultrasound image was acquired.  The left common femoral artery was accessed under ultrasound guidance with an 18-gauge needle. An 035 wire was advanced into the aorta and a 5 French sheath was placed.   An omniflush catheter was advanced over the wire to the level of L-1.  An abdominal angiogram was obtained.  Next, using the omniflush catheter and a benson wire, the aortic bifurcation was crossed and the catheter was placed into theright external iliac artery and right runoff was obtained.  left runoff was performed via retrograde sheath injections.  Findings:   Aortogram:  The visualized portions of the suprarenal abdominal aorta showed no significant disease. There is no evidence of renal artery stenosis. The infrarenal abdominal aorta is widely patent. Bilateral common external and internal iliac arteries are widely patent.  Right Lower Extremity:  The right common femoral artery is widely patent. There is a tortuous aspect of the origin of the right profunda femoral artery.  Superficial femoral artery is patent down to the adductor canal where it occludes. There is reconstitution of the proximal posterior tibial artery and the peroneal artery. These are facedown across the ankle. The anterior tibial artery is not well visualized.  Left Lower Extremity:  The left common femoral artery is widely patent. The left profunda femoral artery is widely patent. Left superficial femoral artery is widely patent. There is approximately a 80% stenosis within the popliteal artery on the left. Single-vessel runoff is seen through the posterior tibial artery which reconstitutes a dorsalis pedis across the ankle.  Intervention:  None  Impression:  #1  occluded right popliteal artery with reconstitution of the proximal posterior tibial artery and potentially the distal tibial peroneal trunk  #2  focal stenosis within the left popliteal artery which appears slightly less than her previous study   V. Annamarie Major, M.D. [facility] [place] Office: [phone] Pager:  [phone]

## 2012-03-05 ENCOUNTER — Ambulatory Visit: Payer: Medicaid Other | Admitting: Surgery

## 2012-03-05 NOTE — Procedures (Unsigned)
BYPASS GRAFT EVALUATION  INDICATION:  Right fem-pop graft 05/27/2011 with revision 05/31/2011  HISTORY: Diabetes:  Yes Cardiac: Hypertension:  Yes Smoking:  Yes Previous Surgery:  See above  SINGLE LEVEL ARTERIAL EXAM                              RIGHT              LEFT Brachial:                    129                128 Anterior tibial:             NV                 127 Posterior tibial:            69                 118 Peroneal: Ankle/brachial index:        0.53               0.98  PREVIOUS ABI:  Date:  11/21/2011  RIGHT:  1.18  LEFT:  1.01  LOWER EXTREMITY BYPASS GRAFT DUPLEX EXAM:  DUPLEX:  No flow could be visualized throughout the right superficial femoral to popliteal arterial Gore-Tex graft. There is a significant decrease in the right ankle brachial index. No Doppler signal could be appreciated in the right dorsalis pedis artery.  IMPRESSION:  Occluded right superficial femoral to popliteal Gore-Tex graft.  ___________________________________________ V. [name], MD  LT/MEDQ  D:  02/27/2012  T:  02/27/2012  Job:  [id number]

## 2012-03-16 ENCOUNTER — Encounter: Payer: Self-pay | Admitting: Surgery

## 2012-03-19 ENCOUNTER — Ambulatory Visit (INDEPENDENT_AMBULATORY_CARE_PROVIDER_SITE_OTHER): Payer: Medicaid Other | Admitting: Surgery

## 2012-03-19 ENCOUNTER — Encounter: Payer: Self-pay | Admitting: Surgery

## 2012-03-19 ENCOUNTER — Encounter (INDEPENDENT_AMBULATORY_CARE_PROVIDER_SITE_OTHER): Payer: Medicaid Other

## 2012-03-19 VITALS — BP 123/81 | HR 96 | Temp 98.7°F | Ht 64.0 in | Wt 162.0 lb

## 2012-03-19 DIAGNOSIS — I739 Peripheral vascular disease, unspecified: Secondary | ICD-10-CM

## 2012-03-19 DIAGNOSIS — I70219 Atherosclerosis of native arteries of extremities with intermittent claudication, unspecified extremity: Secondary | ICD-10-CM

## 2012-03-19 DIAGNOSIS — Z01810 Encounter for preprocedural cardiovascular examination: Secondary | ICD-10-CM

## 2012-03-19 NOTE — Progress Notes (Signed)
[facility] [place]   Patient name: Jenna Brown MRN: [id number] DOB: 08/05/1951 Sex: female     Chief Complaint  Patient presents with  . PVD    2 week f/u     HISTORY OF PRESENT ILLNESS: The patient comes back today for followup. She recently underwent angiogram on June 12 which reveals an occluded popliteal artery on the right. There is reconstitution of the posterior tibial artery. She continues to have an ulcer on her right second toe. She quit smoking on June 10.  Past Medical History  Diagnosis Date  . Diabetes mellitus   . Hypertension   . Hyperlipidemia   . Myocardial infarction 1990's  . Peripheral vascular disease   . Stroke     TIA history    Past Surgical History  Procedure Date  . Cleft palate repair   . Pr vein bypass graft,aorto-fem-pop 05/27/11    Right SFA-Below knee Pop BP    History   Social History  . Marital Status: Single    Spouse Name: N/A    Number of Children: N/A  . Years of Education: N/A   Occupational History  . Not on file.   Social History Main Topics  . Smoking status: Former Smoker -- 40 years    Types: Cigarettes    Quit date: 02/27/2012  . Smokeless tobacco: Never Used  . Alcohol Use: No  . Drug Use: No  . Sexually Active: Not on file   Other Topics Concern  . Not on file   Social History Narrative  . No narrative on file    Family History  Problem Relation Age of Onset  . Cancer Mother     breast  . Diabetes Father     Allergies as of 03/19/2012 - Review Complete 03/19/2012  Allergen Reaction Noted  . Codeine Other (See Comments) 05/25/2011  . Plavix (clopidogrel bisulfate) Other (See Comments) 02/29/2012  . Tylenol (acetaminophen) Other (See Comments) 05/25/2011  . Tylenol with codeine #3 (acetaminophen-codeine) Other (See Comments) 02/28/2012    Current Outpatient Prescriptions on File Prior to Visit  Medication Sig Dispense Refill  . ALPRAZolam (XANAX) 0.25 MG tablet Take 0.25 mg  by mouth 3 (three) times daily as needed. For anxiety      . amLODipine (NORVASC) 10 MG tablet Take 10 mg by mouth daily.      Marland Kitchen glyBURIDE-metformin (GLUCOVANCE) 5-500 MG per tablet Take 2 tablets by mouth 2 (two) times daily with a meal.       . hydrochlorothiazide (HYDRODIURIL) 25 MG tablet Take 25 mg by mouth daily.      Marland Kitchen ibuprofen (ADVIL,MOTRIN) 200 MG tablet Take 400 mg by mouth every 6 (six) hours as needed. For pain      . JANUVIA 100 MG tablet Take 100 mg by mouth daily.      Marland Kitchen losartan (COZAAR) 100 MG tablet Take 100 mg by mouth daily.      . meclizine (ANTIVERT) 25 MG tablet Take 25 mg by mouth daily as needed. For vertigo         REVIEW OF SYSTEMS: No change from prior visit  PHYSICAL EXAMINATION:   Vital signs are BP 123/81  Pulse 96  Temp 98.7 F (37.1 C) (Oral)  Ht 5\' 4"  (1.626 m)  Wt 162 lb (73.483 kg)  BMI 27.81 kg/m2  SpO2 100% General: The patient appears their stated age. HEENT:  No gross abnormalities Abdomen: Soft and non-tender Musculoskeletal: There are no major  deformities. Neurologic: No focal weakness or paresthesias are detected, Skin: Small ulcer on the tip of the right second toe Psychiatric: The patient has normal affect. Cardiovascular: There is a regular rate and rhythm without significant murmur appreciated.   Diagnostic Studies I have ordered and reviewed her vein mapping today. There is minimal saphenous vein left on the right leg. The short saphenous vein diameters range from 0.2 5.35 on the right. The left saphenous vein appears to exit the fascia and the mid thigh. Diameter measurements ranged from 0.542 0.18. This is somewhat different from a study done one year ago which showed an adequate vein.  Assessment: Right toe ulcer Plan: I discussed with the patient proceeding with redo bypass. She is not a candidate for percutaneous intervention. I'm a little trouble having to do this because she has occluded both a vein bypass and a Gore-Tex  bypass done in the past. However, with the ulcer on her second toe I do not see any other options. I told her I would try to use vein however based on her vein mapping she does not have any good options. L. reevaluate her vein with ultrasound in the operating room. She understands that this may require using vein from the left leg. I could potentially use a composite graft. At this point, my plan is to use the distal superficial femoral artery as the inflow vessel and not go into her groin with the distal anastomosis being the posterior tibial artery. L. consider placing her on Coumadin postoperatively since she has occluded to prior bypasses her operation has been scheduled for Thursday, July 11  V. [name], M.D. [facility] [place] Office: [phone] Pager:  [phone]

## 2012-03-21 ENCOUNTER — Other Ambulatory Visit: Payer: Self-pay

## 2012-03-21 ENCOUNTER — Encounter (HOSPITAL_COMMUNITY): Payer: Self-pay | Admitting: Pharmacist

## 2012-03-26 NOTE — Procedures (Unsigned)
VASCULAR LAB EXAM  INDICATION:  The patient is evaluated prior to revascularization of the right lower extremity for peripheral vascular disease.  She is status post right femoral-below the knee popliteal bypass graft with ipsilateral reverse saphenous vein on 05/27/2011.  She underwent revision of this bypass graft on 05/31/2011.  An ultrasound performed in this office on 02/27/2012 revealed occlusion of the right lower extremity bypass graft.  HISTORY: Diabetes:  Yes Cardiac:  No Hypertension:  Yes Smoking:  Yes  EXAM:  Duplex ultrasound of the superficial veins in the lower extremity is performed.  Most of the right great saphenous vein has been utilized to create the previous right femoral-below the knee popliteal bypass graft.  The right great saphenous vein is not visualized from the proximal to mid thigh to the mid to distal calf.  The left great saphenous vein is patent and easily compressible with measurements from 0.13 cm to 0.54 cm in the thigh and calf.  The small saphenous vein is visualized and is easily compressible bilaterally.  The right small saphenous vein measures from 0.22 cm to 0.39 cm and the left measures from 0.21 cm to 0.31 cm.  IMPRESSION: 1. Mostly absent right great saphenous vein status post previous     surgical revascularization. 2. Patent left greater saphenous vein with measurements on attached     diagram. 3. Patent small saphenous vein bilaterally with measurements as listed     on the attached diagram.  ___________________________________________ V. [name], MD  CI/MEDQ  D:  03/19/2012  T:  03/19/2012  Job:  [id number]

## 2012-03-27 ENCOUNTER — Encounter (HOSPITAL_COMMUNITY): Payer: Self-pay

## 2012-03-27 ENCOUNTER — Encounter (HOSPITAL_COMMUNITY)
Admission: RE | Admit: 2012-03-27 | Discharge: 2012-03-27 | Disposition: A | Discharge status: Home / Self Care | Payer: Medicaid Other | Source: Ambulatory Visit | Attending: Surgery | Admitting: Surgery

## 2012-03-27 HISTORY — DX: Unspecified hemorrhoids: K64.9

## 2012-03-27 HISTORY — DX: Nausea with vomiting, unspecified: R11.2

## 2012-03-27 HISTORY — DX: Dizziness and giddiness: R42

## 2012-03-27 HISTORY — DX: Other specified postprocedural states: Z98.890

## 2012-03-27 HISTORY — DX: Headache: R51

## 2012-03-27 HISTORY — DX: Anxiety disorder, unspecified: F41.9

## 2012-03-27 LAB — CBC
MCV: 81.6 fL (ref 78.0–100.0)
Platelets: 352 10*3/uL (ref 150–400)
RBC: 4.51 MIL/uL (ref 3.87–5.11)
RDW: 12.4 % (ref 11.5–15.5)
WBC: 10.3 10*3/uL (ref 4.0–10.5)

## 2012-03-27 LAB — PROTIME-INR
INR: 0.91 (ref 0.00–1.49)
Prothrombin Time: 12.5 seconds (ref 11.6–15.2)

## 2012-03-27 LAB — COMPREHENSIVE METABOLIC PANEL
ALT: 12 U/L (ref 0–35)
AST: 12 U/L (ref 0–37)
Albumin: 3.7 g/dL (ref 3.5–5.2)
CO2: 26 mEq/L (ref 19–32)
Chloride: 101 mEq/L (ref 96–112)
Creatinine, Ser: 0.78 mg/dL (ref 0.50–1.10)
GFR calc non Af Amer: >90 mL/min (ref >90)
Sodium: 139 mEq/L (ref 135–145)
Total Bilirubin: 0.5 mg/dL (ref 0.3–1.2)

## 2012-03-27 LAB — URINALYSIS, ROUTINE W REFLEX MICROSCOPIC
Ketones, ur: NEGATIVE mg/dL
Nitrite: NEGATIVE
Protein, ur: NEGATIVE mg/dL
Urobilinogen, UA: 0.2 mg/dL (ref 0.0–1.0)

## 2012-03-27 LAB — URINE MICROSCOPIC-ADD ON

## 2012-03-27 LAB — SURGICAL PCR SCREEN: MRSA, PCR: NEGATIVE

## 2012-03-27 NOTE — Pre-Procedure Instructions (Signed)
20 Jenna Brown  03/27/2012   Your procedure is scheduled on:  Thurs, July 11 @ 7:30 AM  Report to [facility] at 5:30 AM.  Call this number if you have problems the morning of surgery: [phone]   Remember:   Do not eat food:After Midnight.    Take these medicines the morning of surgery with A SIP OF WATER: Xanax(Alprazolam),Amlodipine(Norvasc), and Meclizine(Antivert-if needed)   Do not wear jewelry, make-up or nail polish.  Do not wear lotions, powders, or perfumes.   Do not shave 48 hours prior to surgery.   Do not bring valuables to the hospital.  Contacts, dentures or bridgework may not be worn into surgery.  Leave suitcase in the car. After surgery it may be brought to your room.  For patients admitted to the hospital, checkout time is 11:00 AM the day of discharge.   Patients discharged the day of surgery will not be allowed to drive home.     Please read over the following fact sheets that you were given: Pain Booklet, Coughing and Deep Breathing, Blood Transfusion Information, MRSA Information and Surgical Site Infection Prevention

## 2012-03-27 NOTE — Progress Notes (Addendum)
Pt states she used to see Eunice visit in 2004  Stress test in epic from 2000/2004/2012 Denies ever having an echo  EKG in epic 02/29/12 CXR in epic from 05/08/12  Medical MD with [facility]

## 2012-03-27 NOTE — Progress Notes (Signed)
To request any records from [facility] related to cardiac

## 2012-03-28 MED ORDER — DEXTROSE 5 % IV SOLN
1.5000 g | INTRAVENOUS | Status: AC
  Administered 2012-03-29: 1.5 g via INTRAVENOUS
  Filled 2012-03-28: qty 1.5

## 2012-03-29 ENCOUNTER — Ambulatory Visit (HOSPITAL_COMMUNITY): Payer: Medicaid Other

## 2012-03-29 ENCOUNTER — Ambulatory Visit (HOSPITAL_COMMUNITY): Payer: Medicaid Other | Admitting: Vascular Surgery

## 2012-03-29 ENCOUNTER — Inpatient Hospital Stay (HOSPITAL_COMMUNITY)
Admission: RE | Admit: 2012-03-29 | Discharge: 2012-04-08 | DRG: 254 | Disposition: A | Discharge status: Home Health | Payer: Medicaid Other | Source: Ambulatory Visit | Attending: Surgery | Admitting: Surgery

## 2012-03-29 ENCOUNTER — Encounter (HOSPITAL_COMMUNITY)
Admission: RE | Disposition: A | Discharge status: Home Health | Payer: Self-pay | Source: Ambulatory Visit | Attending: Surgery

## 2012-03-29 ENCOUNTER — Encounter (HOSPITAL_COMMUNITY): Payer: Self-pay | Admitting: Vascular Surgery

## 2012-03-29 ENCOUNTER — Encounter (HOSPITAL_COMMUNITY): Payer: Self-pay | Admitting: *Deleted

## 2012-03-29 DIAGNOSIS — I10 Essential (primary) hypertension: Secondary | ICD-10-CM | POA: Diagnosis present

## 2012-03-29 DIAGNOSIS — F411 Generalized anxiety disorder: Secondary | ICD-10-CM | POA: Diagnosis present

## 2012-03-29 DIAGNOSIS — I739 Peripheral vascular disease, unspecified: Secondary | ICD-10-CM

## 2012-03-29 DIAGNOSIS — Z885 Allergy status to narcotic agent status: Secondary | ICD-10-CM

## 2012-03-29 DIAGNOSIS — Z8673 Personal history of transient ischemic attack (TIA), and cerebral infarction without residual deficits: Secondary | ICD-10-CM

## 2012-03-29 DIAGNOSIS — I252 Old myocardial infarction: Secondary | ICD-10-CM

## 2012-03-29 DIAGNOSIS — L98499 Non-pressure chronic ulcer of skin of other sites with unspecified severity: Secondary | ICD-10-CM | POA: Diagnosis present

## 2012-03-29 DIAGNOSIS — Z87891 Personal history of nicotine dependence: Secondary | ICD-10-CM

## 2012-03-29 DIAGNOSIS — E785 Hyperlipidemia, unspecified: Secondary | ICD-10-CM | POA: Diagnosis present

## 2012-03-29 DIAGNOSIS — E119 Type 2 diabetes mellitus without complications: Secondary | ICD-10-CM | POA: Diagnosis present

## 2012-03-29 DIAGNOSIS — L97509 Non-pressure chronic ulcer of other part of unspecified foot with unspecified severity: Secondary | ICD-10-CM | POA: Diagnosis present

## 2012-03-29 DIAGNOSIS — Z888 Allergy status to other drugs, medicaments and biological substances status: Secondary | ICD-10-CM

## 2012-03-29 DIAGNOSIS — I70309 Unspecified atherosclerosis of unspecified type of bypass graft(s) of the extremities, unspecified extremity: Principal | ICD-10-CM | POA: Diagnosis present

## 2012-03-29 DIAGNOSIS — I70219 Atherosclerosis of native arteries of extremities with intermittent claudication, unspecified extremity: Secondary | ICD-10-CM

## 2012-03-29 HISTORY — PX: FEMORAL-TIBIAL BYPASS GRAFT: SHX938

## 2012-03-29 LAB — CBC
HCT: 30.6 % — ABNORMAL LOW (ref 36.0–46.0)
MCV: 81.2 fL (ref 78.0–100.0)
Platelets: 277 10*3/uL (ref 150–400)
RBC: 3.77 MIL/uL — ABNORMAL LOW (ref 3.87–5.11)
RDW: 12.3 % (ref 11.5–15.5)
WBC: 19.4 10*3/uL — ABNORMAL HIGH (ref 4.0–10.5)

## 2012-03-29 LAB — GLUCOSE, CAPILLARY: Glucose-Capillary: 264 mg/dL — ABNORMAL HIGH (ref 70–99)

## 2012-03-29 SURGERY — CREATION, BYPASS, ARTERIAL, FEMORAL TO TIBIAL, USING GRAFT
Anesthesia: General | Site: Leg Lower | Laterality: Right | Wound class: Clean

## 2012-03-29 MED ORDER — DOPAMINE-DEXTROSE 3.2-5 MG/ML-% IV SOLN: 3.0000 ug/kg/min | INTRAVENOUS | Status: DC

## 2012-03-29 MED ORDER — LIDOCAINE HCL (CARDIAC) 20 MG/ML IV SOLN
INTRAVENOUS | Status: DC | PRN
  Administered 2012-03-29: 70 mg via INTRAVENOUS

## 2012-03-29 MED ORDER — WARFARIN SODIUM 7.5 MG PO TABS
7.5000 mg | ORAL_TABLET | Freq: Once | ORAL | Status: DC
  Filled 2012-03-29: qty 1

## 2012-03-29 MED ORDER — PROTAMINE SULFATE 10 MG/ML IV SOLN
INTRAVENOUS | Status: DC | PRN
  Administered 2012-03-29: 50 mg via INTRAVENOUS

## 2012-03-29 MED ORDER — PANTOPRAZOLE SODIUM 40 MG PO TBEC
40.0000 mg | DELAYED_RELEASE_TABLET | Freq: Every day | ORAL | Status: DC
  Administered 2012-03-29 – 2012-04-08 (×11): 40 mg via ORAL
  Filled 2012-03-29 (×11): qty 1

## 2012-03-29 MED ORDER — LOSARTAN POTASSIUM 50 MG PO TABS: 100.0000 mg | ORAL_TABLET | Freq: Every morning | ORAL | Status: DC

## 2012-03-29 MED ORDER — SODIUM CHLORIDE 0.9 % IR SOLN
Status: DC | PRN
  Administered 2012-03-29: 08:00:00

## 2012-03-29 MED ORDER — GLYBURIDE-METFORMIN 5-500 MG PO TABS: 2.0000 | ORAL_TABLET | Freq: Two times a day (BID) | ORAL | Status: DC

## 2012-03-29 MED ORDER — AMLODIPINE BESYLATE 10 MG PO TABS
10.0000 mg | ORAL_TABLET | Freq: Every morning | ORAL | Status: DC
  Administered 2012-03-30 – 2012-04-08 (×10): 10 mg via ORAL
  Filled 2012-03-29 (×10): qty 1

## 2012-03-29 MED ORDER — HEPARIN (PORCINE) IN NACL 100-0.45 UNIT/ML-% IJ SOLN
600.0000 [IU]/h | INTRAMUSCULAR | Status: DC
  Administered 2012-03-29: 600 [IU]/h via INTRAVENOUS
  Filled 2012-03-29 (×2): qty 250

## 2012-03-29 MED ORDER — BISACODYL 5 MG PO TBEC
5.0000 mg | DELAYED_RELEASE_TABLET | Freq: Every day | ORAL | Status: DC | PRN
  Administered 2012-04-02 – 2012-04-07 (×2): 5 mg via ORAL
  Filled 2012-03-29 (×2): qty 1

## 2012-03-29 MED ORDER — PHENYLEPHRINE HCL 10 MG/ML IJ SOLN
INTRAMUSCULAR | Status: DC | PRN
  Administered 2012-03-29 (×5): 80 ug via INTRAVENOUS

## 2012-03-29 MED ORDER — MECLIZINE HCL 25 MG PO TABS
25.0000 mg | ORAL_TABLET | Freq: Two times a day (BID) | ORAL | Status: DC | PRN
  Filled 2012-03-29: qty 1

## 2012-03-29 MED ORDER — INSULIN ASPART 100 UNIT/ML ~~LOC~~ SOLN
0.0000 [IU] | SUBCUTANEOUS | Status: DC
  Administered 2012-03-29: 5 [IU] via SUBCUTANEOUS
  Administered 2012-03-29: 8 [IU] via SUBCUTANEOUS
  Administered 2012-03-30: 5 [IU] via SUBCUTANEOUS
  Administered 2012-03-30 (×3): 3 [IU] via SUBCUTANEOUS
  Administered 2012-03-30 – 2012-03-31 (×2): 2 [IU] via SUBCUTANEOUS
  Administered 2012-03-31: 3 [IU] via SUBCUTANEOUS
  Administered 2012-03-31: 2 [IU] via SUBCUTANEOUS
  Administered 2012-03-31 – 2012-04-01 (×2): 5 [IU] via SUBCUTANEOUS
  Administered 2012-04-01: 2 [IU] via SUBCUTANEOUS
  Administered 2012-04-02: 3 [IU] via SUBCUTANEOUS
  Administered 2012-04-03: 2 [IU] via SUBCUTANEOUS
  Filled 2012-03-29: qty 0.15
  Filled 2012-03-29: qty 3

## 2012-03-29 MED ORDER — METFORMIN HCL 500 MG PO TABS
1000.0000 mg | ORAL_TABLET | Freq: Two times a day (BID) | ORAL | Status: DC
  Administered 2012-03-29 – 2012-04-07 (×17): 1000 mg via ORAL
  Filled 2012-03-29 (×24): qty 2

## 2012-03-29 MED ORDER — IOHEXOL 300 MG/ML  SOLN
INTRAMUSCULAR | Status: AC
  Filled 2012-03-29: qty 1

## 2012-03-29 MED ORDER — GUAIFENESIN-DM 100-10 MG/5ML PO SYRP: 15.0000 mL | ORAL_SOLUTION | ORAL | Status: DC | PRN

## 2012-03-29 MED ORDER — SODIUM CHLORIDE 0.9 % IV SOLN: 500.0000 mL | Freq: Once | INTRAVENOUS | Status: AC | PRN

## 2012-03-29 MED ORDER — PHENOL 1.4 % MT LIQD: 1.0000 | OROMUCOSAL | Status: DC | PRN

## 2012-03-29 MED ORDER — HYDROMORPHONE HCL PF 1 MG/ML IJ SOLN: 0.2500 mg | INTRAMUSCULAR | Status: DC | PRN

## 2012-03-29 MED ORDER — GLYCOPYRROLATE 0.2 MG/ML IJ SOLN
INTRAMUSCULAR | Status: DC | PRN
  Administered 2012-03-29: 0.6 mg via INTRAVENOUS
  Administered 2012-03-29: 0.1 mg via INTRAVENOUS

## 2012-03-29 MED ORDER — ALPRAZOLAM 0.25 MG PO TABS: 0.2500 mg | ORAL_TABLET | Freq: Two times a day (BID) | ORAL | Status: DC | PRN

## 2012-03-29 MED ORDER — SODIUM CHLORIDE 0.9 % IV SOLN
10.0000 mg | INTRAVENOUS | Status: DC | PRN
  Administered 2012-03-29: 20 ug/min via INTRAVENOUS

## 2012-03-29 MED ORDER — MAGNESIUM SULFATE 40 MG/ML IJ SOLN
2.0000 g | Freq: Once | INTRAMUSCULAR | Status: AC | PRN
  Filled 2012-03-29: qty 50

## 2012-03-29 MED ORDER — SIMVASTATIN 40 MG PO TABS
40.0000 mg | ORAL_TABLET | Freq: Every day | ORAL | Status: DC
  Filled 2012-03-29: qty 1

## 2012-03-29 MED ORDER — HEPARIN SODIUM (PORCINE) 1000 UNIT/ML IJ SOLN
INTRAMUSCULAR | Status: DC | PRN
  Administered 2012-03-29: 1000 [IU] via INTRAVENOUS
  Administered 2012-03-29: 6000 [IU] via INTRAVENOUS
  Administered 2012-03-29: 1000 [IU] via INTRAVENOUS

## 2012-03-29 MED ORDER — POTASSIUM CHLORIDE CRYS ER 20 MEQ PO TBCR: 20.0000 meq | EXTENDED_RELEASE_TABLET | Freq: Once | ORAL | Status: AC | PRN

## 2012-03-29 MED ORDER — HYDRALAZINE HCL 20 MG/ML IJ SOLN
10.0000 mg | INTRAMUSCULAR | Status: DC | PRN
  Filled 2012-03-29: qty 0.5

## 2012-03-29 MED ORDER — PROPOFOL 10 MG/ML IV EMUL
INTRAVENOUS | Status: DC | PRN
  Administered 2012-03-29: 200 mg via INTRAVENOUS

## 2012-03-29 MED ORDER — DOCUSATE SODIUM 100 MG PO CAPS
100.0000 mg | ORAL_CAPSULE | Freq: Every day | ORAL | Status: DC
  Administered 2012-03-30 – 2012-04-08 (×9): 100 mg via ORAL
  Filled 2012-03-29 (×10): qty 1

## 2012-03-29 MED ORDER — LABETALOL HCL 5 MG/ML IV SOLN
10.0000 mg | INTRAVENOUS | Status: DC | PRN
  Filled 2012-03-29: qty 4

## 2012-03-29 MED ORDER — DEXTROSE 5 % IV SOLN
1.5000 g | Freq: Two times a day (BID) | INTRAVENOUS | Status: AC
  Administered 2012-03-29 – 2012-03-30 (×2): 1.5 g via INTRAVENOUS
  Filled 2012-03-29 (×3): qty 1.5

## 2012-03-29 MED ORDER — HYDROCHLOROTHIAZIDE 25 MG PO TABS
25.0000 mg | ORAL_TABLET | Freq: Every morning | ORAL | Status: DC
  Administered 2012-03-30 – 2012-04-08 (×10): 25 mg via ORAL
  Filled 2012-03-29 (×10): qty 1

## 2012-03-29 MED ORDER — FENTANYL CITRATE 0.05 MG/ML IJ SOLN
INTRAMUSCULAR | Status: DC | PRN
  Administered 2012-03-29: 50 ug via INTRAVENOUS
  Administered 2012-03-29: 125 ug via INTRAVENOUS
  Administered 2012-03-29: 25 ug via INTRAVENOUS

## 2012-03-29 MED ORDER — LOSARTAN POTASSIUM 50 MG PO TABS
100.0000 mg | ORAL_TABLET | Freq: Every day | ORAL | Status: DC
  Administered 2012-03-30 – 2012-04-08 (×10): 100 mg via ORAL
  Filled 2012-03-29 (×10): qty 2

## 2012-03-29 MED ORDER — HEPARIN (PORCINE) IN NACL 100-0.45 UNIT/ML-% IJ SOLN
1450.0000 [IU]/h | INTRAMUSCULAR | Status: AC
  Administered 2012-03-30: 1150 [IU]/h via INTRAVENOUS
  Administered 2012-03-31: 1300 [IU]/h via INTRAVENOUS
  Filled 2012-03-29 (×5): qty 250

## 2012-03-29 MED ORDER — ATORVASTATIN CALCIUM 20 MG PO TABS
20.0000 mg | ORAL_TABLET | Freq: Every day | ORAL | Status: DC
  Administered 2012-03-29 – 2012-04-07 (×9): 20 mg via ORAL
  Filled 2012-03-29 (×11): qty 1

## 2012-03-29 MED ORDER — OXYCODONE HCL 5 MG PO TABS
5.0000 mg | ORAL_TABLET | ORAL | Status: DC | PRN
  Administered 2012-03-30 – 2012-03-31 (×4): 5 mg via ORAL
  Administered 2012-03-31: 10 mg via ORAL
  Administered 2012-04-01 – 2012-04-04 (×5): 5 mg via ORAL
  Administered 2012-04-05: 10 mg via ORAL
  Administered 2012-04-05 – 2012-04-08 (×6): 5 mg via ORAL
  Filled 2012-03-29 (×5): qty 1
  Filled 2012-03-29: qty 2
  Filled 2012-03-29: qty 1
  Filled 2012-03-29: qty 2
  Filled 2012-03-29 (×8): qty 1
  Filled 2012-03-29: qty 2

## 2012-03-29 MED ORDER — ONDANSETRON HCL 4 MG/2ML IJ SOLN: 4.0000 mg | Freq: Once | INTRAMUSCULAR | Status: DC | PRN

## 2012-03-29 MED ORDER — DEXTROSE 5 % IV SOLN
INTRAVENOUS | Status: DC | PRN
  Administered 2012-03-29: 08:00:00 via INTRAVENOUS

## 2012-03-29 MED ORDER — ONDANSETRON HCL 4 MG/2ML IJ SOLN
INTRAMUSCULAR | Status: DC | PRN
  Administered 2012-03-29: 4 mg via INTRAVENOUS

## 2012-03-29 MED ORDER — ROCURONIUM BROMIDE 100 MG/10ML IV SOLN
INTRAVENOUS | Status: DC | PRN
  Administered 2012-03-29: 50 mg via INTRAVENOUS

## 2012-03-29 MED ORDER — NEOSTIGMINE METHYLSULFATE 1 MG/ML IJ SOLN
INTRAMUSCULAR | Status: DC | PRN
  Administered 2012-03-29: 4 mg via INTRAVENOUS

## 2012-03-29 MED ORDER — ASPIRIN 81 MG PO CHEW
81.0000 mg | CHEWABLE_TABLET | ORAL | Status: DC
  Administered 2012-03-30 – 2012-04-07 (×5): 81 mg via ORAL
  Filled 2012-03-29 (×5): qty 1

## 2012-03-29 MED ORDER — GLYBURIDE 5 MG PO TABS
10.0000 mg | ORAL_TABLET | Freq: Two times a day (BID) | ORAL | Status: DC
  Administered 2012-03-29 – 2012-04-07 (×17): 10 mg via ORAL
  Filled 2012-03-29 (×23): qty 2

## 2012-03-29 MED ORDER — SODIUM CHLORIDE 0.9 % IR SOLN
Status: DC | PRN
  Administered 2012-03-29: 2000 mL

## 2012-03-29 MED ORDER — SENNOSIDES-DOCUSATE SODIUM 8.6-50 MG PO TABS
1.0000 | ORAL_TABLET | Freq: Every evening | ORAL | Status: DC | PRN
  Filled 2012-03-29: qty 1

## 2012-03-29 MED ORDER — COUMADIN BOOK
Freq: Once | Status: DC
  Filled 2012-03-29: qty 1

## 2012-03-29 MED ORDER — WARFARIN - PHARMACIST DOSING INPATIENT: Freq: Every day | Status: DC

## 2012-03-29 MED ORDER — ONDANSETRON HCL 4 MG/2ML IJ SOLN
4.0000 mg | Freq: Four times a day (QID) | INTRAMUSCULAR | Status: DC | PRN
  Administered 2012-03-29 – 2012-04-01 (×2): 4 mg via INTRAVENOUS
  Filled 2012-03-29 (×2): qty 2

## 2012-03-29 MED ORDER — SODIUM CHLORIDE 0.9 % IV SOLN: INTRAVENOUS | Status: DC

## 2012-03-29 MED ORDER — METOPROLOL TARTRATE 1 MG/ML IV SOLN
2.0000 mg | INTRAVENOUS | Status: DC | PRN
  Filled 2012-03-29: qty 5

## 2012-03-29 MED ORDER — SODIUM CHLORIDE 0.9 % IV SOLN
INTRAVENOUS | Status: DC
  Administered 2012-03-29: 125 mL/h via INTRAVENOUS
  Administered 2012-03-30 – 2012-03-31 (×3): via INTRAVENOUS

## 2012-03-29 MED ORDER — LACTATED RINGERS IV SOLN
INTRAVENOUS | Status: DC | PRN
  Administered 2012-03-29 (×3): via INTRAVENOUS

## 2012-03-29 MED ORDER — HYDROMORPHONE HCL PF 1 MG/ML IJ SOLN
0.5000 mg | INTRAMUSCULAR | Status: DC | PRN
  Administered 2012-03-29: 1 mg via INTRAVENOUS
  Filled 2012-03-29: qty 1

## 2012-03-29 MED ORDER — IOHEXOL 300 MG/ML  SOLN
INTRAMUSCULAR | Status: DC | PRN
  Administered 2012-03-29: 25 mL via INTRAVENOUS

## 2012-03-29 MED ORDER — LINAGLIPTIN 5 MG PO TABS
5.0000 mg | ORAL_TABLET | Freq: Every day | ORAL | Status: DC
  Administered 2012-03-29 – 2012-04-08 (×11): 5 mg via ORAL
  Filled 2012-03-29 (×11): qty 1

## 2012-03-29 MED ORDER — WARFARIN VIDEO: Freq: Once | Status: DC

## 2012-03-29 SURGICAL SUPPLY — 76 items
ADH SKN CLS APL DERMABOND .7 (GAUZE/BANDAGES/DRESSINGS) ×1
ADH SKN CLS LQ APL DERMABOND (GAUZE/BANDAGES/DRESSINGS) ×1
BAG ISL DRAPE 18X18 STRL (DRAPES) ×1
BAG ISOLATION DRAPE 18X18 (DRAPES) IMPLANT
BAG SNAP BAND KOVER 36X36 (MISCELLANEOUS) ×1 IMPLANT
BANDAGE ELASTIC 4 VELCRO ST LF (GAUZE/BANDAGES/DRESSINGS) IMPLANT
BANDAGE ESMARK 6X9 LF (GAUZE/BANDAGES/DRESSINGS) IMPLANT
BNDG CMPR 9X6 STRL LF SNTH (GAUZE/BANDAGES/DRESSINGS) ×1
BNDG ESMARK 6X9 LF (GAUZE/BANDAGES/DRESSINGS) ×2
CANISTER SUCTION 2500CC (MISCELLANEOUS) ×2 IMPLANT
CATH EMB 3FR 40CM (CATHETERS) ×1 IMPLANT
CLIP TI MEDIUM 24 (CLIP) ×2 IMPLANT
CLIP TI WIDE RED SMALL 24 (CLIP) ×2 IMPLANT
CLOTH BEACON ORANGE TIMEOUT ST (SAFETY) ×2 IMPLANT
COVER SURGICAL LIGHT HANDLE (MISCELLANEOUS) ×4 IMPLANT
CUFF TOURNIQUET SINGLE 24IN (TOURNIQUET CUFF) ×1 IMPLANT
CUFF TOURNIQUET SINGLE 34IN LL (TOURNIQUET CUFF) IMPLANT
CUFF TOURNIQUET SINGLE 44IN (TOURNIQUET CUFF) IMPLANT
DERMABOND ADHESIVE PROPEN (GAUZE/BANDAGES/DRESSINGS) ×1
DERMABOND ADVANCED (GAUZE/BANDAGES/DRESSINGS) ×1
DERMABOND ADVANCED .7 DNX12 (GAUZE/BANDAGES/DRESSINGS) ×1 IMPLANT
DERMABOND ADVANCED .7 DNX6 (GAUZE/BANDAGES/DRESSINGS) IMPLANT
DRAIN CHANNEL 15F RND FF W/TCR (WOUND CARE) IMPLANT
DRAPE ISOLATION BAG 18X18 (DRAPES) ×1
DRAPE WARM FLUID 44X44 (DRAPE) ×2 IMPLANT
DRAPE X-RAY CASS 24X20 (DRAPES) ×1 IMPLANT
DRSG COVADERM 4X10 (GAUZE/BANDAGES/DRESSINGS) IMPLANT
DRSG COVADERM 4X6 (GAUZE/BANDAGES/DRESSINGS) ×1 IMPLANT
DRSG COVADERM 4X8 (GAUZE/BANDAGES/DRESSINGS) IMPLANT
ELECT REM PT RETURN 9FT ADLT (ELECTROSURGICAL) ×2
ELECTRODE REM PT RTRN 9FT ADLT (ELECTROSURGICAL) ×1 IMPLANT
EVACUATOR SILICONE 100CC (DRAIN) IMPLANT
GLOVE BIOGEL PI IND STRL 6.5 (GLOVE) IMPLANT
GLOVE BIOGEL PI IND STRL 7.5 (GLOVE) ×1 IMPLANT
GLOVE BIOGEL PI INDICATOR 6.5 (GLOVE) ×6
GLOVE BIOGEL PI INDICATOR 7.5 (GLOVE) ×2
GLOVE ECLIPSE 6.5 STRL STRAW (GLOVE) ×2 IMPLANT
GLOVE SS BIOGEL STRL SZ 7 (GLOVE) IMPLANT
GLOVE SS N UNI LF 6.5 STRL (GLOVE) ×1 IMPLANT
GLOVE SUPERSENSE BIOGEL SZ 7 (GLOVE) ×1
GLOVE SURG SS PI 7.5 STRL IVOR (GLOVE) ×3 IMPLANT
GOWN PREVENTION PLUS XLARGE (GOWN DISPOSABLE) ×1 IMPLANT
GOWN PREVENTION PLUS XXLARGE (GOWN DISPOSABLE) ×2 IMPLANT
GOWN STRL NON-REIN LRG LVL3 (GOWN DISPOSABLE) ×5 IMPLANT
GRAFT PROPATEN W/RING 6X80X60 (Vascular Products) ×1 IMPLANT
HEMOSTAT SNOW SURGICEL 2X4 (HEMOSTASIS) ×2 IMPLANT
HEMOSTAT SURGICEL 2X14 (HEMOSTASIS) IMPLANT
KIT BASIN OR (CUSTOM PROCEDURE TRAY) ×2 IMPLANT
KIT ROOM TURNOVER OR (KITS) ×2 IMPLANT
MARKER GRAFT CORONARY BYPASS (MISCELLANEOUS) IMPLANT
NS IRRIG 1000ML POUR BTL (IV SOLUTION) ×4 IMPLANT
PACK PERIPHERAL VASCULAR (CUSTOM PROCEDURE TRAY) ×2 IMPLANT
PAD ARMBOARD 7.5X6 YLW CONV (MISCELLANEOUS) ×4 IMPLANT
PADDING CAST COTTON 6X4 STRL (CAST SUPPLIES) ×1 IMPLANT
SET COLLECT BLD 21X3/4 12 (NEEDLE) ×1 IMPLANT
STAPLER VISISTAT 35W (STAPLE) IMPLANT
STOPCOCK 4 WAY LG BORE MALE ST (IV SETS) ×1 IMPLANT
SUT ETHILON 3 0 PS 1 (SUTURE) IMPLANT
SUT GORETEX 6.0 TT13 (SUTURE) ×1 IMPLANT
SUT PROLENE 5 0 C 1 24 (SUTURE) ×2 IMPLANT
SUT PROLENE 6 0 BV (SUTURE) ×7 IMPLANT
SUT PROLENE 7 0 BV 1 (SUTURE) IMPLANT
SUT SILK 2 0 FS (SUTURE) ×1 IMPLANT
SUT SILK 3 0 (SUTURE) ×2
SUT SILK 3-0 18XBRD TIE 12 (SUTURE) IMPLANT
SUT VIC AB 2-0 CT1 27 (SUTURE) ×4
SUT VIC AB 2-0 CT1 TAPERPNT 27 (SUTURE) ×2 IMPLANT
SUT VIC AB 3-0 SH 27 (SUTURE) ×6
SUT VIC AB 3-0 SH 27X BRD (SUTURE) ×2 IMPLANT
SUT VICRYL 4-0 PS2 18IN ABS (SUTURE) ×4 IMPLANT
TOWEL OR 17X24 6PK STRL BLUE (TOWEL DISPOSABLE) ×4 IMPLANT
TOWEL OR 17X26 10 PK STRL BLUE (TOWEL DISPOSABLE) ×4 IMPLANT
TRAY FOLEY CATH 14FRSI W/METER (CATHETERS) ×2 IMPLANT
TUBING EXTENTION W/L.L. (IV SETS) ×1 IMPLANT
UNDERPAD 30X30 INCONTINENT (UNDERPADS AND DIAPERS) ×2 IMPLANT
WATER STERILE IRR 1000ML POUR (IV SOLUTION) ×2 IMPLANT

## 2012-03-29 NOTE — OR Nursing (Signed)
Care assumed from s gregson rn

## 2012-03-29 NOTE — Preoperative (Signed)
Beta Blockers   Reason not to administer Beta Blockers:Not Applicable 

## 2012-03-29 NOTE — Progress Notes (Signed)
ANTICOAGULATION CONSULT NOTE - Initial Consult  Pharmacy Consult for warfarin, pharmacy to antibiotics based on renal function Indication: VTE prophylaxis  Allergies  Allergen Reactions  . Codeine Other (See Comments)    "makes me feel strange"  . Plavix (Clopidogrel Bisulfate) Itching  . Tylenol (Acetaminophen) Other (See Comments)    "Doesn't feel right"   . Tylenol With Codeine #3 (Acetaminophen-Codeine) Other (See Comments)    unknown    Patient Measurements: Weight: 162 lb 0.6 oz (73.5 kg)  Vital Signs: Temp: 97.7 F (36.5 C) (07/11 1400) Temp src: Oral (07/11 1400) BP: 143/65 mmHg (07/11 1321) Pulse Rate: 93  (07/11 1335)  Labs:  Basename 03/27/12 1416  HGB 12.0  HCT 36.8  PLT 352  APTT 31  LABPROT 12.5  INR 0.91  HEPARINUNFRC --  CREATININE 0.78  CKTOTAL --  CKMB --  TROPONINI --    The CrCl is unknown because both a height and weight (above a minimum accepted value) are required for this calculation.   Medical History: Past Medical History  Diagnosis Date  . Hyperlipidemia     takes Simvastatin daily  . Peripheral vascular disease   . Stroke     TIA history  . PONV (postoperative nausea and vomiting)   . Hypertension     takes Amlodipine/HCTZ/Losartan daily  . Myocardial infarction 1990's  . Headache     when b/p is elevated  . Vertigo     takes Ativert prn  . Hemorrhoids   . Diabetes mellitus     takes Glucovance and Januvia daily  . Anxiety     takes Xanax prn    Medications:  Scheduled:    . amLODipine  10 mg Oral q morning - 10a  . aspirin  81 mg Oral QODAY  . cefUROXime (ZINACEF)  IV  1.5 g Intravenous 30 min Pre-Op  . cefUROXime (ZINACEF)  IV  1.5 g Intravenous Q12H  . docusate sodium  100 mg Oral Daily  . glyBURIDE  10 mg Oral BID WC  . hydrochlorothiazide  25 mg Oral q morning - 10a  . insulin aspart  0-15 Units Subcutaneous Q4H  . linagliptin  5 mg Oral Daily  . losartan  100 mg Oral Daily  . metFORMIN  1,000 mg Oral  BID WC  . pantoprazole  40 mg Oral Q1200  . simvastatin  40 mg Oral QHS  . DISCONTD: glyBURIDE-metformin  2 tablet Oral BID WC  . DISCONTD: losartan  100 mg Oral q morning - 10a   Infusions:    . sodium chloride    . DOPamine    . heparin 1,000 Units/hr (03/29/12 1421)  . DISCONTD: sodium chloride    . DISCONTD: heparin 600 Units/hr (03/29/12 1239)    Assessment: 61 yo F with PMH significant for PVD admitted 03/29/12 for femoral-tibial bypass graft. H/H 12/36.8, plts 352 with 175 mL of blood lost during surgery. INR 0.91 on 7/9. Not on anticoagulation PTA. Pt receives score of 7 per warfarin dosing nomogram.  CrCl > 90 ml/min, no dosage adjustments needed for cefuroxime at this time   Goal of Therapy:  INR 2-3 Monitor platelets by anticoagulation protocol: Yes   Plan:  1. Give warfarin 7.5mg  PO x1 tonight 2. Daily PT/INR, CBC 3. F/u s/sx bleeding, renal fnx 4. Give warfarin book and video  Johny Drilling, PharmD Clinical Pharmacist Pager: [phone] Pharmacy: [phone] 03/29/2012 3:03 PM

## 2012-03-29 NOTE — Op Note (Signed)
[facility] [place]  Patient name: Jenna Brown MRN: [id number] DOB: 1951/03/21 Sex: female  03/29/2012 Pre-operative Diagnosis: Right leg ulcer Post-operative diagnosis:  Same Surgeon:  [name] Assistants:  Gerri Lins Procedure:  #1)  right femoral to posterior tibial artery bypass graft with 6 mm propatent PTFE and Taylor patch using right greater saphenous vein   #2) intraoperative arteriogram Anesthesia:  Gen. Blood Loss:  See anesthesia record Specimens:  None  Findings:  Extremely small posterior tibial artery, 1-1.5 mm. I used a portion of the proximal right greater saphenous vein to create a Engineer, water. She did not have any other suitable vein for conduit. The proximal anastomosis was to the common femoral artery.  Indications:  The patient has a history of a distal superficial femoral to below knee popliteal artery bypass graft with vein which occluded in the early postoperative period. She then underwent above to below knee bypass with Gore-Tex. She presented with a ulcer on her right toe. Her bypass graft was found to be occluded. She comes in today for operation. She continues to smoke.  Procedure:  The patient was identified in the holding area and taken to [place] [facility]  The patient was then placed supine on the table. general anesthesia was administered.  The patient was prepped and draped in the usual sterile fashion.  A time out was called and antibiotics were administered.  A longitudinal incision was made in the right groin. The common femoral, superficial femoral, and profunda femoral artery were individually isolated. They were all disease-free. I then exposed the saphenofemoral junction and traced out the saphenous vein until it was terminated from her previous surgery. It was approximately 4 cm of saphenous vein. Attention was then turned towards the lower leg. A longitudinal incision was made just below her previous incision.  Cautery is used to divide the subcutaneous tissue. The fascia was then opened with cautery. I divided the soleus muscle with cautery. The posterior tibial artery was then exposed. This was a very small artery, measuring 1-1.5 mm. It was not calcified. I then created a tunnel between the 2 incisions using a 6 mm tunneler. The patient was then fully heparinized. I removed the proximal portion of the saphenous vein. The saphenofemoral junction was oversewn with a 5-0 Prolene. After the heparin circulated the common femoral, superficial femoral, and profunda femoral artery were occluded with vascular clamps. A #11 blade was used to make an arteriotomy which was extended with Potts scissors in the common femoral artery. A 6 mm propatent PTFE Gore-Tex graft was selected. It was beveled to fit the size of the arteriotomy. A running anastomosis was created with CV 6 Gore suture. Prior to completion the appropriate flushing maneuvers were performed and the anastomosis was completed. Clamps were then released. There was excellent pulsatile flow through the graft. The tourniquet was then placed on the mid thigh. The leg was exsanguinated. The tourniquet was taken to 250 mm of pressure. A #11 blade was used to make an arteriotomy which was extended longitudinally with Potts scissors. I passed a #3 Fogarty down well past the ankle joint. I was able to hold the Fogarty catheter. There was no resistance. I elected to create a Lovena Le patch at the distal anastomosis. The Gore-Tex graft was cut appropriately. The Gore-Tex graft was sewn to the proximal portion of the arteriotomy with a running CV 6 Gore-Tex suture. I then cut the patch to the appropriate size and then  completed the anastomosis by sewing the vein patch to the artery with a running 6-0 Prolene. Prior to completion the tourniquet was let down the graft and artery were appropriately flushed. There was a meager backbleeding from the posterior tibial artery. The  anastomosis was then completed. There was a multiphasic signal just beyond the anastomosis however I could not hear a signal in the posterior tibial artery at the ankle. I then elected tissue a intraoperative arteriogram which revealed a widely patent anastomosis and a patent posterior tibial artery across the ankle. This was a very small vessel. Over the next several minutes I did eventually here return of a signal to the posterior tibial artery which did appear to be biphasic. There did not appear to be any additional adjunctive procedures to be done at this time. The patient was given 50 mg of protamine. Once I was satisfied with hemostasis, the fascia in the lower leg incision was closed with 2-0 Vicryl. The subcutaneous tissue was closed with 3-0 Vicryl the skin closed with 4-0 Vicryl. The groin was then irrigated. The femoral sheath was reapproximated with 2-0 Vicryl. The subcutaneous tissue was closed with an additional layer of 203 0 Vicryl. The skin was closed with 4-0 Vicryl. Dermabond placed on all incisions. The patient was taken to the holding area for our restart her heparin.   Disposition:  To PACU in stable condition.   Theotis Burrow, M.D. [facility] [place] [facility] Office: [phone] Pager:  [phone]

## 2012-03-29 NOTE — OR Nursing (Signed)
Maureen pa/vasc noted at bedside color of toes/ no change from earlier

## 2012-03-29 NOTE — Progress Notes (Signed)
Vascular Surgery  I was called to patient's room 14:13 secondary to the nursing staff being unable to doppler post tibial.  The pulses were found via doppler after a third doppler machine was brought into the room.    The patient was made NPO until the post tibial pulse was confirmed by Dr. [name] who arrived within minutes of our phone conversation.    We will increase her heprin to 1000/hr  She will remain under close observation.  She may slowly advance her diet as tolerates. Beronica Lansdale MAUREEN PA-C

## 2012-03-29 NOTE — Progress Notes (Signed)
Utilization review completed.  

## 2012-03-29 NOTE — Progress Notes (Signed)
Patient just received into 3313 from PACU during initial assessment with PACU nurse we were unable to doppler any pulses in the rt. Leg. Notified Maureen,PA who also came up and checked pulses and unable got new machine and could hear faint pulse in the rt. Posterior calf. Have notified Dr. [name] who came up to see patient. Able to doppler the bypass graft behind her knee. Also increased heparin drip to 1000units per verbal order from Dr. [name].

## 2012-03-29 NOTE — Anesthesia Procedure Notes (Signed)
Procedure Name: Intubation Date/Time: 03/29/2012 7:34 AM Performed by: Williemae Area B Pre-anesthesia Checklist: Patient identified, Emergency Drugs available, Suction available and Patient being monitored Patient Re-evaluated:Patient Re-evaluated prior to inductionOxygen Delivery Method: Circle system utilized Preoxygenation: Pre-oxygenation with 100% oxygen Intubation Type: IV induction Ventilation: Oral airway inserted - appropriate to patient size and Mask ventilation without difficulty Laryngoscope Size: Mac and 3 Grade View: Grade II Tube type: Oral Tube size: 7.5 mm Number of attempts: 1 Airway Equipment and Method: Stylet Placement Confirmation: breath sounds checked- equal and bilateral,  ETT inserted through vocal cords under direct vision and positive ETCO2 Secured at: 21 (cm at gum) cm Tube secured with: Tape Dental Injury: Teeth and Oropharynx as per pre-operative assessment

## 2012-03-29 NOTE — Interval H&P Note (Signed)
History and Physical Interval Note:  03/29/2012 7:11 AM  Jenna Brown  has presented today for surgery, with the diagnosis of Peripheral Arterial Disease  The various methods of treatment have been discussed with the patient and family. After consideration of risks, benefits and other options for treatment, the patient has consented to  Procedure(s) (LRB): BYPASS GRAFT FEMORAL-TIBIAL ARTERY (Right) as a surgical intervention .  The patient's history has been reviewed, patient examined, no change in status, stable for surgery.  I have reviewed the patients' chart and labs.  Questions were answered to the patient's satisfaction.     BRABHAM IV, V. WELLS

## 2012-03-29 NOTE — Transfer of Care (Signed)
Immediate Anesthesia Transfer of Care Note  Patient: Jenna Brown  Procedure(s) Performed: Procedure(s) (LRB): BYPASS GRAFT FEMORAL-TIBIAL ARTERY (Right)  Patient Location: PACU  Anesthesia Type: General  Level of Consciousness: sedated and patient cooperative  Airway & Oxygen Therapy: Patient Spontanous Breathing and Patient connected to nasal cannula oxygen  Post-op Assessment: Report given to PACU RN and Post -op Vital signs reviewed and stable  Post vital signs: Reviewed and stable  Complications: No apparent anesthesia complications

## 2012-03-29 NOTE — Anesthesia Postprocedure Evaluation (Signed)
  Anesthesia Post-op Note  Patient: Jenna Brown  Procedure(s) Performed: Procedure(s) (LRB): BYPASS GRAFT FEMORAL-TIBIAL ARTERY (Right)  Patient Location: PACU  Anesthesia Type: General  Level of Consciousness: awake, alert , oriented and patient cooperative  Airway and Oxygen Therapy: Patient Spontanous Breathing  Post-op Pain: mild  Post-op Assessment: Post-op Vital signs reviewed, Patient's Cardiovascular Status Stable, Respiratory Function Stable, Patent Airway, No signs of Nausea or vomiting and Pain level controlled  Post-op Vital Signs: stable  Complications: No apparent anesthesia complications

## 2012-03-29 NOTE — H&P (View-Only) (Signed)
[facility] [place] [place]   Patient name: Jenna Brown MRN: [id number] DOB: May 22, 1951 Sex: female     Chief Complaint  Patient presents with  . PVD    2 week f/u     HISTORY OF PRESENT ILLNESS: The patient comes back today for followup. She recently underwent angiogram on June 12 which reveals an occluded popliteal artery on the right. There is reconstitution of the posterior tibial artery. She continues to have an ulcer on her right second toe. She quit smoking on June 10.  Past Medical History  Diagnosis Date  . Diabetes mellitus   . Hypertension   . Hyperlipidemia   . Myocardial infarction 1990's  . Peripheral vascular disease   . Stroke     TIA history    Past Surgical History  Procedure Date  . Cleft palate repair   . Pr vein bypass graft,aorto-fem-pop 05/27/11    Right SFA-Below knee Pop BP    History   Social History  . Marital Status: Single    Spouse Name: N/A    Number of Children: N/A  . Years of Education: N/A   Occupational History  . Not on file.   Social History Main Topics  . Smoking status: Former Smoker -- 40 years    Types: Cigarettes    Quit date: 02/27/2012  . Smokeless tobacco: Never Used  . Alcohol Use: No  . Drug Use: No  . Sexually Active: Not on file   Other Topics Concern  . Not on file   Social History Narrative  . No narrative on file    Family History  Problem Relation Age of Onset  . Cancer Mother     breast  . Diabetes Father     Allergies as of 03/19/2012 - Review Complete 03/19/2012  Allergen Reaction Noted  . Codeine Other (See Comments) 05/25/2011  . Plavix (clopidogrel bisulfate) Other (See Comments) 02/29/2012  . Tylenol (acetaminophen) Other (See Comments) 05/25/2011  . Tylenol with codeine #3 (acetaminophen-codeine) Other (See Comments) 02/28/2012    Current Outpatient Prescriptions on File Prior to Visit  Medication Sig Dispense Refill  . ALPRAZolam (XANAX) 0.25 MG tablet Take 0.25 mg  by mouth 3 (three) times daily as needed. For anxiety      . amLODipine (NORVASC) 10 MG tablet Take 10 mg by mouth daily.      Marland Kitchen glyBURIDE-metformin (GLUCOVANCE) 5-500 MG per tablet Take 2 tablets by mouth 2 (two) times daily with a meal.       . hydrochlorothiazide (HYDRODIURIL) 25 MG tablet Take 25 mg by mouth daily.      Marland Kitchen ibuprofen (ADVIL,MOTRIN) 200 MG tablet Take 400 mg by mouth every 6 (six) hours as needed. For pain      . JANUVIA 100 MG tablet Take 100 mg by mouth daily.      Marland Kitchen losartan (COZAAR) 100 MG tablet Take 100 mg by mouth daily.      . meclizine (ANTIVERT) 25 MG tablet Take 25 mg by mouth daily as needed. For vertigo         REVIEW OF SYSTEMS: No change from prior visit  PHYSICAL EXAMINATION:   Vital signs are BP 123/81  Pulse 96  Temp 98.7 F (37.1 C) (Oral)  Ht 5\' 4"  (1.626 m)  Wt 162 lb (73.483 kg)  BMI 27.81 kg/m2  SpO2 100% General: The patient appears their stated age. HEENT:  No gross abnormalities Abdomen: Soft and non-tender Musculoskeletal: There are no major  deformities. Neurologic: No focal weakness or paresthesias are detected, Skin: Small ulcer on the tip of the right second toe Psychiatric: The patient has normal affect. Cardiovascular: There is a regular rate and rhythm without significant murmur appreciated.   Diagnostic Studies I have ordered and reviewed her vein mapping today. There is minimal saphenous vein left on the right leg. The short saphenous vein diameters range from 0.2 5.35 on the right. The left saphenous vein appears to exit the fascia and the mid thigh. Diameter measurements ranged from 0.542 0.18. This is somewhat different from a study done one year ago which showed an adequate vein.  Assessment: Right toe ulcer Plan: I discussed with the patient proceeding with redo bypass. She is not a candidate for percutaneous intervention. I'm a little trouble having to do this because she has occluded both a vein bypass and a Gore-Tex  bypass done in the past. However, with the ulcer on her second toe I do not see any other options. I told her I would try to use vein however based on her vein mapping she does not have any good options. L. reevaluate her vein with ultrasound in the operating room. She understands that this may require using vein from the left leg. I could potentially use a composite graft. At this point, my plan is to use the distal superficial femoral artery as the inflow vessel and not go into her groin with the distal anastomosis being the posterior tibial artery. L. consider placing her on Coumadin postoperatively since she has occluded to prior bypasses her operation has been scheduled for Thursday, July 11  V. [name], M.D. [facility] [place] Office: [phone] Pager:  [phone]

## 2012-03-29 NOTE — Anesthesia Preprocedure Evaluation (Addendum)
Anesthesia Evaluation  Patient identified by MRN, date of birth, ID band Patient awake    Reviewed: Allergy & Precautions, H&P , NPO status , Patient's Chart, lab work & pertinent test results, reviewed documented beta blocker date and time   History of Anesthesia Complications (+) PONV  Airway Mallampati: I TM Distance: >3 FB Neck ROM: full    Dental  (+) Edentulous Upper and Edentulous Lower   Pulmonary          Cardiovascular hypertension, Pt. on medications + CAD, + Past MI and + Peripheral Vascular Disease Rhythm:regular Rate:Normal     Neuro/Psych  Headaches, CVA, No Residual Symptoms    GI/Hepatic   Endo/Other  Well Controlled, Type 2, Oral Hypoglycemic Agents  Renal/GU      Musculoskeletal   Abdominal   Peds  Hematology   Anesthesia Other Findings Hx cleft palate repair.  Reproductive/Obstetrics                         Anesthesia Physical Anesthesia Plan  ASA: III  Anesthesia Plan: General   Post-op Pain Management:    Induction: Intravenous  Airway Management Planned: Oral ETT  Additional Equipment:   Intra-op Plan:   Post-operative Plan: Extubation in OR  Informed Consent: I have reviewed the patients History and Physical, chart, labs and discussed the procedure including the risks, benefits and alternatives for the proposed anesthesia with the patient or authorized representative who has indicated his/her understanding and acceptance.     Plan Discussed with: CRNA, Anesthesiologist and Surgeon  Anesthesia Plan Comments:         Anesthesia Quick Evaluation

## 2012-03-30 ENCOUNTER — Encounter (HOSPITAL_COMMUNITY): Payer: Self-pay | Admitting: Surgery

## 2012-03-30 DIAGNOSIS — Z951 Presence of aortocoronary bypass graft: Secondary | ICD-10-CM

## 2012-03-30 LAB — CBC
Platelets: 291 10*3/uL (ref 150–400)
RBC: 3.72 MIL/uL — ABNORMAL LOW (ref 3.87–5.11)
RDW: 12.3 % (ref 11.5–15.5)
WBC: 14.3 10*3/uL — ABNORMAL HIGH (ref 4.0–10.5)

## 2012-03-30 LAB — BASIC METABOLIC PANEL
BUN: 13 mg/dL (ref 6–23)
CO2: 26 mEq/L (ref 19–32)
Calcium: 8.9 mg/dL (ref 8.4–10.5)
GFR calc non Af Amer: 80 mL/min — ABNORMAL LOW (ref >90)
Glucose, Bld: 102 mg/dL — ABNORMAL HIGH (ref 70–99)

## 2012-03-30 LAB — GLUCOSE, CAPILLARY
Glucose-Capillary: 114 mg/dL — ABNORMAL HIGH (ref 70–99)
Glucose-Capillary: 172 mg/dL — ABNORMAL HIGH (ref 70–99)
Glucose-Capillary: 157 mg/dL — ABNORMAL HIGH (ref 70–99)
Glucose-Capillary: 248 mg/dL — ABNORMAL HIGH (ref 70–99)
Glucose-Capillary: 175 mg/dL — ABNORMAL HIGH (ref 70–99)

## 2012-03-30 LAB — HEPARIN LEVEL (UNFRACTIONATED): Heparin Unfractionated: 0.17 IU/mL — ABNORMAL LOW (ref 0.30–0.70)

## 2012-03-30 LAB — PROTIME-INR: Prothrombin Time: 14.2 seconds (ref 11.6–15.2)

## 2012-03-30 LAB — APTT: aPTT: 58 seconds — ABNORMAL HIGH (ref 24–37)

## 2012-03-30 MED FILL — Insulin Aspart Inj 100 Unit/ML: SUBCUTANEOUS | Qty: 0.08 | Status: AC

## 2012-03-30 MED FILL — Insulin Aspart Inj 100 Unit/ML: SUBCUTANEOUS | Qty: 0.02 | Status: AC

## 2012-03-30 MED FILL — Insulin Aspart Inj 100 Unit/ML: SUBCUTANEOUS | Qty: 0.05 | Status: AC

## 2012-03-30 MED FILL — Insulin Aspart Inj 100 Unit/ML: SUBCUTANEOUS | Qty: 0.03 | Status: AC

## 2012-03-30 NOTE — Progress Notes (Signed)
Occupational Therapy Evaluation Patient Details Name: Jenna Brown MRN: [id number] DOB: 11/14/1950 Today's Date: 03/30/2012 Time: [id number] OT Time Calculation (min): 37 min  OT Assessment / Plan / Recommendation Clinical Impression  61 y.o. pt. s/p Rt. femoral to posterior tibial artery bypass graft. Pt. with decreased balance and activity tolerance. Pt. will benefit from OT to maximize independence and safety in ADLs prior to d/c. OT to follow acutely.    OT Assessment  Patient needs continued OT Services    Follow Up Recommendations  Home health OT    Barriers to Discharge      Equipment Recommendations  Tub/shower seat    Recommendations for Other Services    Frequency  Min 2X/week    Precautions / Restrictions Restrictions Weight Bearing Restrictions: No   Pertinent Vitals/Pain DOE. HR elevated to 125 during activity. Pt. Reported pain 3/10 in surgical leg.     ADL  Grooming: Performed;Teeth care;Min guard Where Assessed - Grooming: Supported standing Toilet Transfer: Performed;Min Psychiatric nurse Method: Sit to Loss adjuster, chartered: Therapist, occupational and Hygiene: Performed;Min guard Where Assessed - Best boy and Hygiene: Standing Equipment Used: Gait belt;Rolling walker Transfers/Ambulation Related to ADLs:  vc's for proper hand placement and to stay inside walker ADL Comments: Pt. ambulated to 3 in 1 and sink with RW with Min G. Pt. demonstrated dyspnea 2/5 after grooming task. Pt. educated on pursed lip breathing.     OT Diagnosis: Acute pain  OT Problem List: Decreased activity tolerance;Impaired balance (sitting and/or standing);Decreased knowledge of use of DME or AE;Pain OT Treatment Interventions: Self-care/ADL training;DME and/or AE instruction;Therapeutic activities;Patient/family education;Balance training   OT Goals Acute Rehab OT Goals OT Goal Formulation: With patient Time For  Goal Achievement: 04/13/12 Potential to Achieve Goals: Good ADL Goals Pt Will Perform Grooming: Standing at sink;with modified independence ADL Goal: Grooming - Progress: Goal set today Pt Will Perform Upper Body Bathing: with modified independence;Sitting at sink ADL Goal: Upper Body Bathing - Progress: Goal set today Pt Will Perform Lower Body Bathing: Sit to stand from chair;with modified independence ADL Goal: Lower Body Bathing - Progress: Goal set today Pt Will Perform Upper Body Dressing: with modified independence;Sitting, chair;Sitting, bed ADL Goal: Upper Body Dressing - Progress: Goal set today Pt Will Perform Lower Body Dressing: with modified independence;Sit to stand from bed;Sit to stand from chair ADL Goal: Lower Body Dressing - Progress: Goal set today Pt Will Transfer to Toilet: with modified independence;Ambulation;Regular height toilet ADL Goal: Toilet Transfer - Progress: Goal set today Pt Will Perform Toileting - Clothing Manipulation: with modified independence;Standing ADL Goal: Toileting - Clothing Manipulation - Progress: Goal set today Pt Will Perform Toileting - Hygiene: with modified independence;Sit to stand from 3-in-1/toilet ADL Goal: Toileting - Hygiene - Progress: Goal set today Pt Will Perform Tub/Shower Transfer: Tub transfer;with modified independence;Ambulation ADL Goal: Tub/Shower Transfer - Progress: Goal set today  Visit Information  Last OT Received On: 03/30/12 Assistance Needed: +1    Subjective Data  Subjective: "My grandson helps me some."   Prior Functioning  Vision/Perception  Home Living Lives With: Alone Available Help at Discharge: Friend(s);Available PRN/intermittently Type of Home: Apartment Home Access: Ramped entrance Entrance Stairs-Number of Steps: 3 steps to mailbox but level surface available as well Entrance Stairs-Rails: Left Home Layout: One level Bathroom Shower/Tub: Tub/shower unit;Curtain Biochemist, clinical:  Standard Bathroom Accessibility: Yes How Accessible: Accessible via walker Home Adaptive Equipment: Walker - rolling Prior Function Level of Independence: Needs  assistance;Independent with assistive device(s) Needs Assistance: Light Housekeeping Light Housekeeping: Total Able to Take Stairs?: Reciprically Driving: No Vocation: On disability Communication Communication: No difficulties Dominant Hand: Left      Cognition  Overall Cognitive Status: Appears within functional limits for tasks assessed/performed Arousal/Alertness: Awake/alert Orientation Level: Appears intact for tasks assessed Behavior During Session: [facility] for tasks performed    Extremity/Trunk Assessment Right Upper Extremity Assessment RUE ROM/Strength/Tone: Within functional levels Left Upper Extremity Assessment LUE ROM/Strength/Tone: Within functional levels   Mobility Bed Mobility Bed Mobility: Supine to Sit Supine to Sit: 5: Supervision Transfers Transfers: Sit to Stand;Stand to Sit Sit to Stand: 4: Min guard;From bed;From chair/3-in-1 Stand to Sit: 4: Min assist;To chair/3-in-1 Details for Transfer Assistance: Min A for controlled decent           End of Session OT - End of Session Activity Tolerance: Patient tolerated treatment well Patient left: in chair;with call bell/phone within reach  [employer], Mendel Ryder 03/30/2012, 10:59 AM

## 2012-03-30 NOTE — Progress Notes (Signed)
ANTICOAGULATION CONSULT NOTE - Follow Up Consult  Pharmacy Consult for Heparin Indication: VTE prophylaxis and PAD  Allergies  Allergen Reactions  . Codeine Other (See Comments)    "makes me feel strange"  . Plavix (Clopidogrel Bisulfate) Itching  . Tylenol (Acetaminophen) Other (See Comments)    "Doesn't feel right"   . Tylenol With Codeine #3 (Acetaminophen-Codeine) Other (See Comments)    unknown    Patient Measurements: Height: 5\' 4"  (162.6 cm) Weight: 162 lb 0.6 oz (73.5 kg) IBW/kg (Calculated) : 54.7  Heparin Dosing Weight: 70Kg  Vital Signs: Temp: 98.2 F (36.8 C) (07/12 1200) Temp src: Oral (07/12 1200) BP: 145/70 mmHg (07/12 1200) Pulse Rate: 119  (07/12 0515)  Labs:  Basename 03/30/12 1452 03/30/12 0420 03/29/12 1507  HGB -- 9.8* 9.8*  HCT -- 29.9* 30.6*  PLT -- 291 277  APTT -- 58* 38*  LABPROT -- 14.2 --  INR -- 1.08 --  HEPARINUNFRC 0.44 0.17* --  CREATININE -- 0.80 --  CKTOTAL -- -- --  CKMB -- -- --  TROPONINI -- -- --    Estimated Creatinine Clearance: 75.3 ml/min (by C-G formula based on Cr of 0.8).   Assessment: Heparin continues s/p fem-tib bypass graft yesterday. Heparin level now therapeutic s/p AM adjustment.  I spoke with Ms. Mattison this morning about her intolerance to Coumadin- she tells me she only remembers taking a pink blood thinner for her heart that she had itching reastion to- she does not recall ever being on Coumadin/Warfarin.  When I mentioned Plavix, she confirms that is what she is intolerant to. I am hesitant to list Coumadin as an allergy/intolerance based on this information. Noted potential plans for transition to Xarelto or other alternative anticoagulant d/t intolerance to Coumadin.   Goal of Therapy:  Heparin level 0.3-0.7 units/ml Monitor platelets by anticoagulation protocol: Yes   Plan:  - Continue Heparin infusion at 1150 units/hr - Will f/up Am CBC and heparin level  Chavis Tessler K. Posey Pronto, PharmD, BCPS.  Clinical  Pharmacist Pager [phone]. 03/30/2012 3:35 PM

## 2012-03-30 NOTE — Evaluation (Signed)
Physical Therapy Evaluation Patient Details Name: Jenna Brown MRN: [id number] DOB: 1951-08-13 Today's Date: 03/30/2012 Time: 1250-1310 PT Time Calculation (min): 20 min  PT Assessment / Plan / Recommendation Clinical Impression  61 y.o. pt. s/p Rt. femoral to posterior tibial artery bypass graft. Pt. with decreased balance, mild weakness and activity tolerance. Pt. will benefit from PT to maximize independence and safety with functional mobility prior to d/c. PT to follow acutely.    PT Assessment  Patient needs continued PT services    Follow Up Recommendations  Home health PT    Barriers to Discharge        Equipment Recommendations  Tub/shower seat    Recommendations for Other Services     Frequency Min 3X/week    Precautions / Restrictions Restrictions Weight Bearing Restrictions: No   Pertinent Vitals/Pain " I'm fine....."five"/10 (pain)      Mobility  Bed Mobility Bed Mobility: Supine to Sit;Sit to Supine Supine to Sit: 5: Supervision Sit to Supine: 5: Supervision Details for Bed Mobility Assistance: reinforced safety issues only Transfers Transfers: Sit to Stand;Stand to Sit Sit to Stand: 5: Supervision;With upper extremity assist;From bed Stand to Sit: 5: Supervision;With upper extremity assist;To bed Details for Transfer Assistance: vc's for safety issues only Ambulation/Gait Ambulation/Gait Assistance: 5: Supervision Ambulation Distance (Feet): 80 Feet Assistive device: Rolling walker Ambulation/Gait Assistance Details: step to gait with R LE held in ER.  Unable to attain toe off yet Gait Pattern: Step-to pattern;Decreased step length - left;Decreased stride length;Decreased stance time - left Stairs: No    Exercises     PT Diagnosis: Difficulty walking;Generalized weakness;Acute pain  PT Problem List: Decreased strength;Decreased activity tolerance;Decreased balance;Pain PT Treatment Interventions: Functional mobility training;Therapeutic  activities;Balance training;Patient/family education   PT Goals Acute Rehab PT Goals PT Goal Formulation: With patient Time For Goal Achievement: 04/06/12 Potential to Achieve Goals: Good Pt will go Supine/Side to Sit: Independently PT Goal: Supine/Side to Sit - Progress: Goal set today Pt will go Sit to Stand: with modified independence PT Goal: Sit to Stand - Progress: Goal set today Pt will Transfer Bed to Chair/Chair to Bed: with modified independence PT Transfer Goal: Bed to Chair/Chair to Bed - Progress: Goal set today Pt will Ambulate: >150 feet;with modified independence;with least restrictive assistive device PT Goal: Ambulate - Progress: Goal set today  Visit Information  Last PT Received On: 03/30/12 Assistance Needed: +1    Subjective Data  Subjective: I'm fine Patient Stated Goal: Home and able to do for myself   Prior Functioning  Home Living Lives With: Alone Available Help at Discharge: Friend(s);Available PRN/intermittently Type of Home: Apartment Home Access: Ramped entrance Entrance Stairs-Number of Steps: 3 steps to mailbox but level surface available as well Entrance Stairs-Rails: Left Home Layout: One level Bathroom Shower/Tub: Tub/shower unit;C[employer]ochemist, clinical: Standard Bathroom Accessibility: Yes How Accessible: Accessible via walker Home Adaptive Equipment: Walker - rolling Prior Function Level of Independence: Needs assistance;Independent with assistive device(s) Needs Assistance: Light Housekeeping Light Housekeeping: Total Able to Take Stairs?: Reciprically Driving: No Vocation: On disability Communication Communication: No difficulties Dominant Hand: Left    Cognition  Overall Cognitive Status: Appears within functional limits for tasks assessed/performed Arousal/Alertness: Awake/alert Orientation Level: Appears intact for tasks assessed Behavior During Session: [facility] for tasks performed    Extremity/Trunk Assessment Right Upper  Extremity Assessment RUE ROM/Strength/Tone: Within functional levels Left Upper Extremity Assessment LUE ROM/Strength/Tone: Within functional levels Right Lower Extremity Assessment RLE ROM/Strength/Tone: WFL for tasks assessed (pain mildly limiting from  being [facility] strength) Left Lower Extremity Assessment LLE ROM/Strength/Tone: Within functional levels   Balance Balance Balance Assessed: Yes Static Sitting Balance Static Sitting - Balance Support: No upper extremity supported;Feet supported Static Sitting - Level of Assistance: 7: Independent  End of Session PT - End of Session Activity Tolerance: Patient tolerated treatment well Patient left: in bed;with call bell/phone within reach Nurse Communication: Mobility status  GP     Jacqulynn Shappell, Tessie Fass 03/30/2012, 1:21 PM  03/30/2012  Donnella Sham, [place] [phone] (pager)

## 2012-03-30 NOTE — Progress Notes (Signed)
I agree with the following treatment note after reviewing documentation.   Johnston, Tilden Broz Brynn   OTR/L Pager: [phone] Office: [phone] .   

## 2012-03-30 NOTE — Progress Notes (Signed)
Inpatient Diabetes Program Recommendations  AACE/ADA: New Consensus Statement on Inpatient Glycemic Control  Target Ranges:  Prepandial:   less than 140 mg/dL      Peak postprandial:   less than 180 mg/dL (1-2 hours)      Critically ill patients:  140 - 180 mg/dL  Pager:  [phone] Hours:  8 am-10pm   Reason for Visit: History of diabetes  Inpatient Diabetes Program Recommendations Correction (SSI): Change to ACHS since patient eating diet HgbA1C: Check HgbA1C to assess glycemic control    Courtney Heys PhD, RN Diabetes Coordinator  Office:  [phone] Team Pager:  [phone]

## 2012-03-30 NOTE — Progress Notes (Signed)
VASCULAR LAB PRELIMINARY  ARTERIAL  ABI completed:    RIGHT    LEFT    PRESSURE WAVEFORM  PRESSURE WAVEFORM  BRACHIAL 152  tri BRACHIAL 150 tri  DP Not audible  DP    AT Not audible  AT 147 tri  PT 22 Severe damp mono PT 136 tri  PER 56 Severe damp mono PER    GREAT TOE  NA GREAT TOE  NA    RIGHT LEFT  ABI 0.37 0.97     Brown, Jenna Delk, RDMS 03/30/2012, 11:40 AM

## 2012-03-30 NOTE — Progress Notes (Addendum)
[facility] [place]  Progress Note Bypass Surgery  Date of Surgery: 03/29/2012  Procedure(s): BYPASS GRAFT FEMORAL-TIBIAL ARTERY Surgeon: Surgeon(s): [name], MD  1 Day Post-Op  History of Present Illness  Jenna Brown is a 61 y.o. female who is S/P Procedure(s): BYPASS GRAFT FEMORAL-TIBIAL ARTERY right.  The patient's pre-op symptoms of pain are Improved . Patients pain is well controlled.    VASC. LAB Studies:        ABI: pending   Imaging: Dg Ang/ext/uni/or Right  03/29/2012  *RADIOLOGY REPORT*  Clinical Data: Intraoperative right lower extremity arteriogram  RIGHT ANG/EXT/UNI/ OR  Comparison:  Intraoperative right lower extremity arteriogram - 05/31/2011  Findings:  A single lateral image of the right lower extremity was obtained during the simultaneous injection of intra-arterial contrast.  The patient has undergone bypass grafting to the right posterior tibial artery.  There is minimal tortuosity and ectasia about anastomosis site without contrast extravasation.  The posterior tibial artery is widely patent to the level of the imaged midfoot.  There is retrograde filling of the more proximal aspect of the native posterior tibial artery with eventual opacification of the peroneal artery which supplies the dorsalis pedis.  The dorsalis pedis is visualized to the level of the hind foot.  Skin staples are seen about the posterior calf.  IMPRESSION: Post bypass grafting to the posterior tibial artery.  Original Report Authenticated By: Rachel Moulds, M.D.    Significant Diagnostic Studies: CBC Lab Results  Component Value Date   WBC 14.3* 03/30/2012   HGB 9.8* 03/30/2012   HCT 29.9* 03/30/2012   MCV 80.4 03/30/2012   PLT 291 03/30/2012    BMET     Component Value Date/Time   NA 143 03/30/2012 0420   K 4.0 03/30/2012 0420   CL 104 03/30/2012 0420   CO2 26 03/30/2012 0420   GLUCOSE 102* 03/30/2012 0420   BUN 13 03/30/2012 0420   CREATININE 0.80  03/30/2012 0420   CALCIUM 8.9 03/30/2012 0420   GFRNONAA 80* 03/30/2012 0420   GFRAA >90 03/30/2012 0420    COAG Lab Results  Component Value Date   INR 1.08 03/30/2012   INR 0.91 03/27/2012   INR 0.82 05/31/2011   No results found for this basename: PTT    Physical Examination  BP Readings from Last 3 Encounters:  03/30/12 130/72  03/30/12 130/72  03/27/12 116/76   Temp Readings from Last 3 Encounters:  03/30/12 99.4 F (37.4 C) Oral  03/30/12 99.4 F (37.4 C) Oral  03/27/12 98.3 F (36.8 C) Oral   SpO2 Readings from Last 3 Encounters:  03/30/12 93%  03/30/12 93%  03/27/12 97%   Pulse Readings from Last 3 Encounters:  03/30/12 119  03/30/12 119  03/27/12 97    Pt is A&O x 3 right lower extremity: Incision/s is/are clean,dry.intact, and  healing without hematoma, erythema or drainage Limb is warm; with good color  Right posterior tibial pulse doppler, distal toes darker discoloration than left without significant changes from post-op appearance.  Sensation grossly intact with full active range of motion.   Assessment/Plan: Pt. Doing well Post-op pain is controlled Wounds are clean, dry, intact or healing well PT/OT for ambulation Continue heparin and coumadin. Creatinine 0.8 Heparin  0.17 Good urine output. We will need to send her home on anti-coagulation medication.  She is not tolerating coumadin well. D/C coumadin and pharmacy will dose heparin.  [name] [facility] [id number] 03/30/2012 7:39 AM  Agree  Biphasic PT signal.  States she can't take coumadin b/c of itching.  I will consider another anticoagulant such as Xeralto as she is intolerant of Plavix as well.  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

## 2012-03-30 NOTE — Progress Notes (Addendum)
ANTICOAGULATION CONSULT NOTE - Initial Consult  Pharmacy Consult for Heparin Indication: VTE prophylaxis and PAD  Allergies  Allergen Reactions  . Codeine Other (See Comments)    "makes me feel strange"  . Plavix (Clopidogrel Bisulfate) Itching  . Tylenol (Acetaminophen) Other (See Comments)    "Doesn't feel right"   . Tylenol With Codeine #3 (Acetaminophen-Codeine) Other (See Comments)    unknown    Patient Measurements: Height: 5\' 4"  (162.6 cm) Weight: 162 lb 0.6 oz (73.5 kg) IBW/kg (Calculated) : 54.7  Heparin Dosing Weight: 70Kg  Vital Signs: Temp: 98.9 F (37.2 C) (07/12 0700) Temp src: Oral (07/12 0700) BP: 130/72 mmHg (07/12 0515) Pulse Rate: 119  (07/12 0515)  Labs:  Basename 03/30/12 0420 03/29/12 1507 03/27/12 1416  HGB 9.8* 9.8* --  HCT 29.9* 30.6* 36.8  PLT 291 277 352  APTT 58* 38* 31  LABPROT 14.2 -- 12.5  INR 1.08 -- 0.91  HEPARINUNFRC 0.17* -- --  CREATININE 0.80 -- 0.78  CKTOTAL -- -- --  CKMB -- -- --  TROPONINI -- -- --    Estimated Creatinine Clearance: 75.3 ml/min (by C-G formula based on Cr of 0.8).   Medical History: Past Medical History  Diagnosis Date  . Hyperlipidemia     takes Simvastatin daily  . Peripheral vascular disease   . Stroke     TIA history  . PONV (postoperative nausea and vomiting)   . Hypertension     takes Amlodipine/HCTZ/Losartan daily  . Myocardial infarction 1990's  . Headache     when b/p is elevated  . Vertigo     takes Ativert prn  . Hemorrhoids   . Diabetes mellitus     takes Glucovance and Januvia daily  . Anxiety     takes Xanax prn    Assessment: Jenna Brown is known to pharmacy from prior Coumadin management. Noted intolerance to Coumadin (itching), as a result, Coumadin has been stopped and patient was initiated on Heparin yesterday. Heparin level is low this AM, H/H has decreased s/p fem-tib bypass graft yesterday. Plts are stable. INR is at baseline as dose last PM was not given. Noted  potential plans for transition to Xarelto or other alternative anticoagulant d/t intolerance to Coumadin.  Goal of Therapy:  Heparin level 0.3-0.7 units/ml Monitor platelets by anticoagulation protocol: Yes   Plan:  - Increase Heparin drip to 1150 units/hr - Check Heparin level at 1500 today - Daily heparin level and CBC - Will check INR daily for the next 3 days - Will d/c daily PTTs as we are monitoring antiXa levels for Heparin  Colonel Krauser K. Posey Pronto, PharmD, BCPS.  Clinical Pharmacist Pager [phone]. 03/30/2012 8:29 AM   I spoke with Jenna Brown this morning about her intolerance to Coumadin- she tells me she only remembers taking a pink blood thinner for her heart that she had itching reastion to- she does not recall ever being on Coumadin/Warfarin. When I mentioned Plavix, she confirms that is what she is intolerant to. I am hesitant to list Coumadin as an allergy/intolerance based on this information.  Jenna Brown K. Posey Pronto, PharmD Pgr [phone] 03/30/2012 9:18 AM

## 2012-03-31 LAB — PROTIME-INR
INR: 1.09 (ref 0.00–1.49)
Prothrombin Time: 14.3 seconds (ref 11.6–15.2)

## 2012-03-31 LAB — CBC
Hemoglobin: 9.3 g/dL — ABNORMAL LOW (ref 12.0–15.0)
MCH: 26.1 pg (ref 26.0–34.0)
Platelets: 296 10*3/uL (ref 150–400)
RBC: 3.56 MIL/uL — ABNORMAL LOW (ref 3.87–5.11)
WBC: 13.4 10*3/uL — ABNORMAL HIGH (ref 4.0–10.5)

## 2012-03-31 LAB — GLUCOSE, CAPILLARY
Glucose-Capillary: 135 mg/dL — ABNORMAL HIGH (ref 70–99)
Glucose-Capillary: 142 mg/dL — ABNORMAL HIGH (ref 70–99)
Glucose-Capillary: 152 mg/dL — ABNORMAL HIGH (ref 70–99)
Glucose-Capillary: 209 mg/dL — ABNORMAL HIGH (ref 70–99)
Glucose-Capillary: 91 mg/dL (ref 70–99)

## 2012-03-31 MED ORDER — RIVAROXABAN 20 MG PO TABS
20.0000 mg | ORAL_TABLET | Freq: Every day | ORAL | Status: DC
  Administered 2012-03-31 – 2012-04-02 (×3): 20 mg via ORAL
  Filled 2012-03-31 (×4): qty 1

## 2012-03-31 NOTE — Progress Notes (Signed)
ANTICOAGULATION CONSULT NOTE - Follow Up Consult  Pharmacy Consult for Heparin Indication: VTE prophylaxis and PAD  Allergies  Allergen Reactions  . Codeine Other (See Comments)    "makes me feel strange"  . Plavix (Clopidogrel Bisulfate) Itching  . Tylenol (Acetaminophen) Other (See Comments)    "Doesn't feel right"   . Tylenol With Codeine #3 (Acetaminophen-Codeine) Other (See Comments)    unknown    Patient Measurements: Height: 5\' 4"  (162.6 cm) Weight: 162 lb 0.6 oz (73.5 kg) IBW/kg (Calculated) : 54.7  Heparin Dosing Weight: 70Kg  Vital Signs: Temp: 98.7 F (37.1 C) (07/13 0407) Temp src: Oral (07/13 0407) BP: 119/99 mmHg (07/13 0407) Pulse Rate: 127  (07/13 0407)  Labs:  Basename 03/31/12 0345 03/30/12 1452 03/30/12 0420 03/29/12 1507  HGB 9.3* -- 9.8* --  HCT 28.9* -- 29.9* 30.6*  PLT 296 -- 291 277  APTT -- -- 58* 38*  LABPROT 14.3 -- 14.2 --  INR 1.09 -- 1.08 --  HEPARINUNFRC 0.16* 0.44 0.17* --  CREATININE -- -- 0.80 --  CKTOTAL -- -- -- --  CKMB -- -- -- --  TROPONINI -- -- -- --    Estimated Creatinine Clearance: 75.3 ml/min (by C-G formula based on Cr of 0.8).   Assessment: Heparin continues s/p fem-tib bypass graft yesterday. Heparin level now subtherapeutic. No issues with line per RN or bleeding noted. Hgb remains low but relatively stable.  Goal of Therapy:  Heparin level 0.3-0.7 units/ml Monitor platelets by anticoagulation protocol: Yes   Plan:  - Increase Heparin infusion to 1300 units/hr - Will f/up 6 hr heparin level  Sherlon Handing, PharmD, BCPS Clinical pharmacist, pager [phone] 03/31/2012 4:56 AM

## 2012-03-31 NOTE — Progress Notes (Signed)
Patient cbg 61, asymptomatic, 4 oz of oj given , cbg rechecked 15 minutes later, cbg  was 91.

## 2012-03-31 NOTE — Progress Notes (Signed)
ANTICOAGULATION CONSULT NOTE - Follow Up Consult  Pharmacy Consult for Heparin Indication: VTE prophylaxis and PAD  Allergies  Allergen Reactions  . Codeine Other (See Comments)    "makes me feel strange"  . Plavix (Clopidogrel Bisulfate) Itching  . Tylenol (Acetaminophen) Other (See Comments)    "Doesn't feel right"   . Tylenol With Codeine #3 (Acetaminophen-Codeine) Other (See Comments)    unknown    Patient Measurements: Height: 5\' 4"  (162.6 cm) Weight: 162 lb 0.6 oz (73.5 kg) IBW/kg (Calculated) : 54.7  Heparin Dosing Weight: 70Kg  Vital Signs: Temp: 100.7 F (38.2 C) (07/13 1112) Temp src: Oral (07/13 1112) BP: 137/67 mmHg (07/13 1112) Pulse Rate: 125  (07/13 1112)  Labs:  Basename 03/31/12 1222 03/31/12 0345 03/30/12 1452 03/30/12 0420 03/29/12 1507  HGB -- 9.3* -- 9.8* --  HCT -- 28.9* -- 29.9* 30.6*  PLT -- 296 -- 291 277  APTT -- -- -- 58* 38*  LABPROT -- 14.3 -- 14.2 --  INR -- 1.09 -- 1.08 --  HEPARINUNFRC 0.20* 0.16* 0.44 -- --  CREATININE -- -- -- 0.80 --  CKTOTAL -- -- -- -- --  CKMB -- -- -- -- --  TROPONINI -- -- -- -- --    Estimated Creatinine Clearance: 75.3 ml/min (by C-G formula based on Cr of 0.8).   Assessment: Admit Complaint: 61 yo F with PMH significant for PVD admitted 03/29/12 for femoral-tibial bypass graft  AC: Heparin per Rx. HL remains low this AM at 0.2. H/H have decr post fem-tib bypass 7/11. Plts stable. No bleeding noted. ** Noted Coumadin intolerance per MD. RPhpoke with Ms. Engeman 7/12 about her intolerance to Coumadin- she says she only remembers taking a pink blood thinner for her heart that she had itching reastion to- she does not recall ever being on Coumadin. When mentioned Plavix, she confirms that is what she is intolerant to. I am hesitant to list Coumadin as an allergy/intolerance based on this information.**  ID: Afeb, WBC 13.4.  CV: VSS.Meds: Norvasc 10mg , ASA 81mg , Lipitor, HCTZ, losartan,   Endo: CBG 135-248  on glyburide, SSI, linagliptin, metformin  GI: CHO modified diet, po PPI  Nephro: SCr 0.80, UOP 1.65ml/kg/hr  Pulm: RA  Best Practices: IV hep, PO PPI  Plan:  - Conr Heparin drip at 1150 units/hr - Considering Coumadin   Goal of Therapy:  Heparin level 0.3-0.7 units/ml Monitor platelets by anticoagulation protocol: Yes   Plan:  Increase IV heparin to 1450 units/hr and recheck level in 6 hrs.  Hanifa Antonetti S. Alford Highland, PharmD, [facility] Clinical Staff Pharmacist Pager [phone]  03/31/2012 1:34 PM

## 2012-03-31 NOTE — Progress Notes (Addendum)
[facility] Progress Note  03/31/2012 7:30 AM POD 2  Subjective:  No complaints  Tm 99.7 now afebrile Filed Vitals:   03/31/12 0407  BP: 119/99  Pulse: 127  Temp: 98.7 F (37.1 C)  Resp: 19    Physical Exam: Incisions:  Right lower incision is c/d/i without drainage.  Right groin bandage is in tact and dry without hematoma. Extremities:  There is a good doppler signal in the right PT just distal to the incision.  It is difficult to find at the ankle, but it is present and monophasic.  There is a doppler signal in the DP on the right as well.  CBC    Component Value Date/Time   WBC 13.4* 03/31/2012 0345   RBC 3.56* 03/31/2012 0345   HGB 9.3* 03/31/2012 0345   HCT 28.9* 03/31/2012 0345   PLT 296 03/31/2012 0345   MCV 81.2 03/31/2012 0345   MCH 26.1 03/31/2012 0345   MCHC 32.2 03/31/2012 0345   RDW 12.5 03/31/2012 0345   LYMPHSABS 3.8 06/11/2011 1631   MONOABS 0.8 06/11/2011 1631   EOSABS 0.1 06/11/2011 1631   BASOSABS 0.1 06/11/2011 1631    BMET    Component Value Date/Time   NA 143 03/30/2012 0420   K 4.0 03/30/2012 0420   CL 104 03/30/2012 0420   CO2 26 03/30/2012 0420   GLUCOSE 102* 03/30/2012 0420   BUN 13 03/30/2012 0420   CREATININE 0.80 03/30/2012 0420   CALCIUM 8.9 03/30/2012 0420   GFRNONAA 80* 03/30/2012 0420   GFRAA >90 03/30/2012 0420    INR    Component Value Date/Time   INR 1.09 03/31/2012 0345     Intake/Output Summary (Last 24 hours) at 03/31/12 0730 Last data filed at 03/31/12 0503  Gross per 24 hour  Intake   2463 ml  Output   2950 ml  Net   -487 ml    RIGHT    LEFT     PRESSURE  WAVEFORM   PRESSURE  WAVEFORM   BRACHIAL  152  tri  BRACHIAL  150  tri   DP  Not audible   DP     AT  Not audible   AT  147  tri   PT  22  Severe damp mono  PT  136  tri   PER  56  Severe damp mono  PER     GREAT TOE   NA  GREAT TOE   NA     RIGHT  LEFT   ABI  0.37  0.97      Assessment/Plan:  61 y.o. female is s/p  #1) right femoral to posterior  tibial artery bypass graft with 6 mm propatent PTFE and Taylor patch using right greater saphenous vein  #2) intraoperative arteriogram   POD 2  -? Start coumadin today-Dr. Stephens Shire H&P states to consider starting coumadin.  Will d/w Dr. Scot Dock.  Continue heparin.  Jenna Locket, PA-C [facility] [phone] 03/31/2012 7:30 AM  Fairly brisk PT signal on Right with doppler. Will start Xarelto. Pharmacy managing heparin.   Jenna Brown. Scot Dock, [place], [place] [phone] 03/31/2012

## 2012-03-31 NOTE — Progress Notes (Signed)
CBG: 63  Treatment: 4 ounces orange juice  Symptoms: none  Follow-up CBG: [id number] CBG Result:91  Possible Reasons for Event: decreased po intake  Comments/MD notified: not notified at this time, will continue to monitor    Daymon Hora, Chauncey [place]

## 2012-03-31 NOTE — Progress Notes (Signed)
Report called pt going to 2009 via w/c with belongings. Suezanne Cheshire

## 2012-04-01 LAB — CBC
HCT: 28 % — ABNORMAL LOW (ref 36.0–46.0)
Hemoglobin: 9 g/dL — ABNORMAL LOW (ref 12.0–15.0)
MCH: 26.1 pg (ref 26.0–34.0)
MCV: 81.2 fL (ref 78.0–100.0)
Platelets: 272 10*3/uL (ref 150–400)
RBC: 3.45 MIL/uL — ABNORMAL LOW (ref 3.87–5.11)
WBC: 13.6 10*3/uL — ABNORMAL HIGH (ref 4.0–10.5)

## 2012-04-01 LAB — GLUCOSE, CAPILLARY
Glucose-Capillary: 114 mg/dL — ABNORMAL HIGH (ref 70–99)
Glucose-Capillary: 137 mg/dL — ABNORMAL HIGH (ref 70–99)
Glucose-Capillary: 140 mg/dL — ABNORMAL HIGH (ref 70–99)
Glucose-Capillary: 202 mg/dL — ABNORMAL HIGH (ref 70–99)
Glucose-Capillary: 61 mg/dL — ABNORMAL LOW (ref 70–99)
Glucose-Capillary: 82 mg/dL (ref 70–99)
Glucose-Capillary: 97 mg/dL (ref 70–99)

## 2012-04-01 NOTE — Progress Notes (Signed)
CBG: 63  Treatment: 4 ounces orange juice  Symptoms: dizzy  Follow-up CBG: Time:0405 CBG Result:82  Possible Reasons for Event: poor po intake  Comments/MD notified: dr not notified at this time; will cont. To monitor    Adelee Hannula, Chauncey Reading

## 2012-04-01 NOTE — Progress Notes (Signed)
Agree  Jenna Brown

## 2012-04-01 NOTE — Progress Notes (Signed)
Patient c/o nausea after taking pain pill, ginger ale and zofran given. Patient did vomit small amount, states she feels better now.

## 2012-04-01 NOTE — Progress Notes (Addendum)
[facility] Progress Note  04/01/2012 7:51 AM POD 3  Subjective:  No complaints  Tm 100.7 now 99.3  HR 110-125 ST    [id number] systolic   [id number] Filed Vitals:   04/01/12 0426  BP: 125/81  Pulse: 114  Temp: 99.3 F (37.4 C)  Resp: 18    Physical Exam: Incisions:  C/d/i Extremities:  Foot still cool to touch-no change from yesterday.  CBC    Component Value Date/Time   WBC 13.6* 04/01/2012 0545   RBC 3.45* 04/01/2012 0545   HGB 9.0* 04/01/2012 0545   HCT 28.0* 04/01/2012 0545   PLT 272 04/01/2012 0545   MCV 81.2 04/01/2012 0545   MCH 26.1 04/01/2012 0545   MCHC 32.1 04/01/2012 0545   RDW 12.4 04/01/2012 0545   LYMPHSABS 3.8 06/11/2011 1631   MONOABS 0.8 06/11/2011 1631   EOSABS 0.1 06/11/2011 1631   BASOSABS 0.1 06/11/2011 1631    BMET    Component Value Date/Time   NA 143 03/30/2012 0420   K 4.0 03/30/2012 0420   CL 104 03/30/2012 0420   CO2 26 03/30/2012 0420   GLUCOSE 102* 03/30/2012 0420   BUN 13 03/30/2012 0420   CREATININE 0.80 03/30/2012 0420   CALCIUM 8.9 03/30/2012 0420   GFRNONAA 80* 03/30/2012 0420   GFRAA >90 03/30/2012 0420    INR    Component Value Date/Time   INR 1.71* 04/01/2012 0545     Intake/Output Summary (Last 24 hours) at 04/01/12 0751 Last data filed at 04/01/12 0300  Gross per 24 hour  Intake 2045.2 ml  Output   1700 ml  Net  345.2 ml     Assessment/Plan:  60 y.o. female is s/p #1) right femoral to posterior tibial artery bypass graft with 6 mm propatent PTFE and Taylor patch using right greater saphenous vein  #2) intraoperative arteriogram  POD 3  -unable to find working doppler - pt has no complaints of pain or any changes from yesterday. -continue to mobilize -continue heparin gtt-Xarelto was started yesterday. Continue heparin per pharmacy  Jenna Locket, PA-C [facility] [phone] 04/01/2012 7:51 AM  Agree with above PT signal with doppler Strong doppler flow in graft behind knee Right  groin wound looks good so far, but will need meticulous wound care.  Judeth Cornfield. Scot Dock, [place], [place] [phone] 04/01/2012

## 2012-04-02 LAB — GLUCOSE, CAPILLARY: Glucose-Capillary: 76 mg/dL (ref 70–99)

## 2012-04-02 MED FILL — Insulin Aspart Inj 100 Unit/ML: SUBCUTANEOUS | Qty: 0.02 | Status: AC

## 2012-04-02 MED FILL — Insulin Aspart Inj 100 Unit/ML: SUBCUTANEOUS | Qty: 0.05 | Status: AC

## 2012-04-02 MED FILL — Insulin Aspart Inj 100 Unit/ML: SUBCUTANEOUS | Qty: 0.03 | Status: AC

## 2012-04-02 NOTE — Progress Notes (Addendum)
[facility] [place]  Progress Note Bypass Surgery  Date of Surgery: 03/29/2012  Procedure(s): BYPASS GRAFT FEMORAL-TIBIAL ARTERY Surgeon: Surgeon(s): [name], MD  4 Days Post-Op  History of Present Illness  Jenna Brown is a 61 y.o. female who is S/P Procedure(s): BYPASS GRAFT FEMORAL-TIBIAL ARTERY right.  The patient's pre-op symptoms of pain are Improved . Patients pain is well controlled.    VASC. LAB Studies:      pending   Imaging: No results found.  Significant Diagnostic Studies: CBC Lab Results  Component Value Date   WBC 13.6* 04/01/2012   HGB 9.0* 04/01/2012   HCT 28.0* 04/01/2012   MCV 81.2 04/01/2012   PLT 272 04/01/2012    BMET     Component Value Date/Time   NA 143 03/30/2012 0420   K 4.0 03/30/2012 0420   CL 104 03/30/2012 0420   CO2 26 03/30/2012 0420   GLUCOSE 102* 03/30/2012 0420   BUN 13 03/30/2012 0420   CREATININE 0.80 03/30/2012 0420   CALCIUM 8.9 03/30/2012 0420   GFRNONAA 80* 03/30/2012 0420   GFRAA >90 03/30/2012 0420    COAG Lab Results  Component Value Date   INR 1.71* 04/01/2012   INR 1.09 03/31/2012   INR 1.08 03/30/2012   No results found for this basename: PTT    Physical Examination  BP Readings from Last 3 Encounters:  04/02/12 130/67  04/02/12 130/67  03/27/12 116/76   Temp Readings from Last 3 Encounters:  04/02/12 98.6 F (37 C) Oral  04/02/12 98.6 F (37 C) Oral  03/27/12 98.3 F (36.8 C) Oral   SpO2 Readings from Last 3 Encounters:  04/02/12 96%  04/02/12 96%  03/27/12 97%   Pulse Readings from Last 3 Encounters:  04/02/12 106  04/02/12 106  03/27/12 97    Pt is A&O x 3 right lower extremity: Incision/s is/are clean,dry.intact, and  healing without hematoma, erythema or drainage Limb is warm; with good color Doppler peroneal and posterior tibialis right LE.   Assessment/Plan: Pt. Doing well Post-op pain is controlled Wounds are healing well PT/OT for ambulation Ordered  home health PT and OT secondary to balance issues and weakness.  Patient lives alone, but has family support. Plan to d/c home on Xarelto, patient does not tolerate coumadin. Possible d/c in am.  [name] [facility] [id number] 04/02/2012 7:41 AM     Agree with above  Biphasic PT signal Need to kep groin incision dry!  Annamarie Major

## 2012-04-02 NOTE — Progress Notes (Signed)
Inpatient Diabetes Program Recommendations  AACE/ADA: New Consensus Statement on Inpatient Glycemic Control (2009)  Target Ranges:  Prepandial:   less than 140 mg/dL      Peak postprandial:   less than 180 mg/dL (1-2 hours)      Critically ill patients:  140 - 180 mg/dL   Reason for Visit: Hypoglycemia  Inpatient Diabetes Program Recommendations Correction (SSI): Pt having lows due to q 4 hr correction.  Please change to tidwc and HS scale.  Oral Agents: Please do not use Glyburide while here, as po intake may not be  consistent. HgbA1C: Check HgbA1C to assess glycemic control  Note: Thank you, Rosita Kea, RN, CNS, Diabetes Coordinator [phone])

## 2012-04-02 NOTE — Progress Notes (Signed)
Occupational Therapy Treatment Patient Details Name: Jenna Brown MRN: [id number] DOB: 1951-01-07 Today's Date: 04/02/2012 Time: [id number] OT Time Calculation (min): 22 min  OT Assessment / Plan / Recommendation Comments on Treatment Session Pt with flat affect and reported some dezziness. Reported to nurse, as well pts concern regarding need to have a BM    Follow Up Recommendations  Home health OT;Other (comment) (if family can provide 24/7 assist)       Equipment Recommendations  Tub/shower seat       Frequency Min 2X/week      Precautions / Restrictions Precautions Precautions: Fall       ADL  Grooming: Performed;Minimal assistance Where Assessed - Grooming: Supported standing Toilet Transfer: Pharmacologist Method: Sit to stand;Stand Ecologist: Other (comment) (bed to chair to bed) Toileting - Clothing Manipulation and Hygiene: Simulated;Minimal assistance Where Assessed - Best boy and Hygiene: Standing      OT Goals ADL Goals ADL Goal: Grooming - Progress: Progressing toward goals ADL Goal: Armed forces technical officer - Progress: Progressing toward goals ADL Goal: Toileting - Clothing Manipulation - Progress: Progressing toward goals ADL Goal: Toileting - Hygiene - Progress: Progressing toward goals  Visit Information  Last OT Received On: 04/02/12          Cognition  Overall Cognitive Status: Appears within functional limits for tasks assessed/performed Arousal/Alertness: Awake/alert Orientation Level: Appears intact for tasks assessed Behavior During Session: Flat affect    Mobility Bed Mobility Details for Bed Mobility Assistance: reinforced safety issues only Transfers Sit to Stand: With upper extremity assist;From bed;4: Min assist Stand to Sit: With upper extremity assist;To bed;4: Min assist Details for Transfer Assistance: vc's for safety issues only         End of Session OT -  End of Session Activity Tolerance: Patient limited by fatigue Patient left: in chair       [place], [place]on Mergenthaler D 04/02/2012, 10:45 AM

## 2012-04-02 NOTE — Progress Notes (Signed)
Progress note   04/02/12 1439  PT Visit Information  Last PT Received On 04/02/12  Assistance Needed +1  PT Time Calculation  PT Start Time 1435  PT Stop Time 1449  PT Time Calculation (min) 14 min  Subjective Data  Subjective I'm fine  Precautions  Precautions Fall  Restrictions  Weight Bearing Restrictions No  Cognition  Overall Cognitive Status Appears within functional limits for tasks assessed/performed  Arousal/Alertness Awake/alert  Orientation Level Appears intact for tasks assessed  Behavior During Session Flat affect  Bed Mobility  Bed Mobility Supine to Sit;Sit to Supine  Supine to Sit 6: Modified independent (Device/Increase time)  Transfers  Transfers Sit to Stand;Stand to Sit  Sit to Stand 6: Modified independent (Device/Increase time);From bed;With upper extremity assist  Stand to Sit 6: Modified independent (Device/Increase time);To chair/3-in-1;With upper extremity assist;With armrests  Ambulation/Gait  Ambulation/Gait Assistance 5: Supervision  Ambulation Distance (Feet) 86 Feet  Assistive device Rolling walker  Ambulation/Gait Assistance Details Cueing to decrease distance from walker.  Antalgic gait from c/o numbness in R foot.   Gait Pattern Step-to pattern;Decreased step length - left;Decreased stride length;Decreased stance time - left  Stairs No  PT - End of Session  Equipment Utilized During Treatment Gait belt  Activity Tolerance Patient tolerated treatment well  Patient left in bed;with call bell/phone within reach  Nurse Communication Mobility status  PT - Assessment/Plan  Comments on Treatment Session Pt mobility improving  PT Plan Discharge plan remains appropriate  PT Frequency Min 3X/week  Follow Up Recommendations Home health PT  Equipment Recommended Tub/shower seat  Acute Rehab PT Goals  PT Goal Formulation With patient  Time For Goal Achievement 04/06/12  Potential to Achieve Goals Good  Pt will go Supine/Side to Sit Independently    PT Goal: Supine/Side to Sit - Progress Progressing toward goal  Pt will go Sit to Stand with modified independence  PT Goal: Sit to Stand - Progress Met  Pt will Transfer Bed to Chair/Chair to Bed with modified independence  PT Transfer Goal: Bed to Chair/Chair to Bed - Progress Met  Pt will Ambulate >150 feet;with modified independence;with least restrictive assistive device  PT Goal: Ambulate - Progress Progressing toward goal  PT Treatments  $Gait Training 8-22 mins  Marik Sedore L. Jerrian Mells DPT [phone]

## 2012-04-03 DIAGNOSIS — Z48812 Encounter for surgical aftercare following surgery on the circulatory system: Secondary | ICD-10-CM

## 2012-04-03 LAB — GLUCOSE, CAPILLARY
Glucose-Capillary: 124 mg/dL — ABNORMAL HIGH (ref 70–99)
Glucose-Capillary: 136 mg/dL — ABNORMAL HIGH (ref 70–99)
Glucose-Capillary: 103 mg/dL — ABNORMAL HIGH (ref 70–99)

## 2012-04-03 MED ORDER — MAGNESIUM HYDROXIDE 400 MG/5ML PO SUSP
30.0000 mL | Freq: Every day | ORAL | Status: DC | PRN
  Administered 2012-04-06: 30 mL via ORAL
  Filled 2012-04-03 (×2): qty 30

## 2012-04-03 MED ORDER — INSULIN ASPART 100 UNIT/ML ~~LOC~~ SOLN
0.0000 [IU] | Freq: Three times a day (TID) | SUBCUTANEOUS | Status: DC
  Administered 2012-04-03: 2 [IU] via SUBCUTANEOUS
  Administered 2012-04-04: 3 [IU] via SUBCUTANEOUS
  Administered 2012-04-04: 2 [IU] via SUBCUTANEOUS
  Administered 2012-04-06: 5 [IU] via SUBCUTANEOUS
  Administered 2012-04-06: 3 [IU] via SUBCUTANEOUS
  Administered 2012-04-06: 5 [IU] via SUBCUTANEOUS
  Administered 2012-04-06: 3 [IU] via SUBCUTANEOUS
  Administered 2012-04-07: 2 [IU] via SUBCUTANEOUS
  Administered 2012-04-08: 3 [IU] via SUBCUTANEOUS

## 2012-04-03 NOTE — Progress Notes (Signed)
BPG occluded by ultrasound.  Will d/c Xaralto and plan for thrombectomy in 48 hours   Wells Khush Pasion

## 2012-04-03 NOTE — Progress Notes (Signed)
[facility] Progress Note  04/03/2012 8:01 AM POD 5  Subjective:  States her foot hurts.  Tm 100.2 now 99.7  VSS   92%RA Filed Vitals:   04/03/12 0418  BP: 108/70  Pulse: 103  Temp: 99.7 F (37.6 C)  Resp: 18    Physical Exam: Incisions:  C/d/i Extremities:  + monophasic doppler signal right PT; motor and sensation in tact right foot.  CBC    Component Value Date/Time   WBC 13.6* 04/01/2012 0545   RBC 3.45* 04/01/2012 0545   HGB 9.0* 04/01/2012 0545   HCT 28.0* 04/01/2012 0545   PLT 272 04/01/2012 0545   MCV 81.2 04/01/2012 0545   MCH 26.1 04/01/2012 0545   MCHC 32.1 04/01/2012 0545   RDW 12.4 04/01/2012 0545   LYMPHSABS 3.8 06/11/2011 1631   MONOABS 0.8 06/11/2011 1631   EOSABS 0.1 06/11/2011 1631   BASOSABS 0.1 06/11/2011 1631    BMET    Component Value Date/Time   NA 143 03/30/2012 0420   K 4.0 03/30/2012 0420   CL 104 03/30/2012 0420   CO2 26 03/30/2012 0420   GLUCOSE 102* 03/30/2012 0420   BUN 13 03/30/2012 0420   CREATININE 0.80 03/30/2012 0420   CALCIUM 8.9 03/30/2012 0420   GFRNONAA 80* 03/30/2012 0420   GFRAA >90 03/30/2012 0420    INR    Component Value Date/Time   INR 1.71* 04/01/2012 0545     Intake/Output Summary (Last 24 hours) at 04/03/12 0801 Last data filed at 04/02/12 2151  Gross per 24 hour  Intake   1440 ml  Output    700 ml  Net    740 ml     Assessment/Plan:  61 y.o. female is s/p  #1) right femoral to posterior tibial artery bypass graft with 6 mm propatent PTFE and Taylor patch using right greater saphenous vein  #2) intraoperative arteriogram   POD 5 -continue xarelto -wounds healing nicely. -continue mobilization  Leontine Locket, PA-C [facility] [phone] 04/03/2012 8:01 AM   Agree with above  PT signal not as strong today, however there is a brisk graft signal Will order duplex to evaluate graft  Annamarie Major

## 2012-04-03 NOTE — Progress Notes (Signed)
Occupational Therapy Treatment Patient Details Name: Jenna Brown MRN: [id number] DOB: 22-Nov-1950 Today's Date: 04/03/2012 Time: [id number] OT Time Calculation (min): 15 min  OT Assessment / Plan / Recommendation Comments on Treatment Session Needed encouragement to work towards I    Follow Up Recommendations  Home health OT;Other (comment) (if family  can provide 24/7 assist)       Equipment Recommendations  Tub/shower seat          Plan Discharge plan remains appropriate    Precautions / Restrictions Precautions Precautions: Fall Restrictions Weight Bearing Restrictions: No       ADL  Grooming: Performed;Wash/dry hands;Wash/dry face;Minimal assistance;Other (comment) (min A for standing balance) Where Assessed - Grooming: Supported standing;Other (comment) Lower Body Dressing: Performed;Maximal assistance Where Assessed - Lower Body Dressing: Supported sit to stand Toilet Transfer: Performed;Minimal assistance Toilet Transfer Method: Sit to stand;Other (comment) (pt sat quickly and needed verbal cues for safety) Toilet Transfer Equipment: Regular height toilet;Grab bars;Other (comment) (used left arm to pull up on grab bar) Toileting - Clothing Manipulation and Hygiene: Performed;Minimal assistance Where Assessed - Toileting Clothing Manipulation and Hygiene: Standing Transfers/Ambulation Related to ADLs: pt with posterior lean during grooming task. pt needed verbal cues for safety. Feel pt a high fall risk with ADL activity ADL Comments: verbal cues for hand placement, use of walker, and safety       OT Goals ADL Goals ADL Goal: Grooming - Progress: Progressing toward goals ADL Goal: Lower Body Dressing - Progress: Progressing toward goals ADL Goal: Toilet Transfer - Progress: Progressing toward goals ADL Goal: Toileting - Clothing Manipulation - Progress: Progressing toward goals ADL Goal: Toileting - Hygiene - Progress: Progressing toward goals  Visit  Information  Last OT Received On: 04/03/12 Assistance Needed: +1          Cognition  Overall Cognitive Status: Appears within functional limits for tasks assessed/performed Arousal/Alertness: Awake/alert Orientation Level: Appears intact for tasks assessed Behavior During Session: Flat affect    Mobility Bed Mobility Bed Mobility: Scooting to HOB;Supine to Sit Supine to Sit: 4: Min assist Sit to Supine: 4: Min assist Scooting to [facility]: 4: Min assist Details for Bed Mobility Assistance: Pt needed verbal cues to encourage I with bed mobility.  Pt wanted to OT help her Transfers Transfers: Sit to Stand;Stand to Sit Sit to Stand: 4: Min assist;From bed;From toilet Stand to Sit: 4: Min assist;To chair/3-in-1;To bed Details for Transfer Assistance: Verbal cues for safest hand placement.      Balance Balance Balance Assessed: No  End of Session OT - End of Session Activity Tolerance: Patient tolerated treatment well Patient left: in bed       [place], Edwena Felty D 04/03/2012, 9:14 AM

## 2012-04-03 NOTE — Progress Notes (Signed)
Physical Therapy Treatment Patient Details Name: Jenna Brown MRN: [id number] DOB: 1950-11-04 Today's Date: 04/03/2012 Time: 0742-0800 PT Time Calculation (min): 18 min  PT Assessment / Plan / Recommendation Comments on Treatment Session  Pt admitted s/p right LE bypass graft and continues to progress.  Pt very motivated and requires mostly cueing for safe sequence and improved quality of mobility.    Follow Up Recommendations  Home health PT    Barriers to Discharge        Equipment Recommendations  Tub/shower seat    Recommendations for Other Services    Frequency Min 3X/week   Plan Discharge plan remains appropriate;Frequency remains appropriate    Precautions / Restrictions Precautions Precautions: Fall Restrictions Weight Bearing Restrictions: No   Pertinent Vitals/Pain 5/10 in right LE.  Pt repositioned with RN aware.    Mobility  Bed Mobility Bed Mobility: Not assessed Transfers Transfers: Sit to Stand;Stand to Sit Sit to Stand: 6: Modified independent (Device/Increase time);With upper extremity assist;From bed Stand to Sit: 5: Supervision;With upper extremity assist;To bed Details for Transfer Assistance: Verbal cues for safest hand placement. Ambulation/Gait Ambulation/Gait Assistance: 5: Supervision Ambulation Distance (Feet): 90 Feet Assistive device: Rolling walker Ambulation/Gait Assistance Details: Verbal cues for tall posture and attempted heel strike with right heel during initial contact as well as step-through sequence. Gait Pattern: Step-to pattern;Decreased step length - right;Decreased stance time - right;Antalgic Stairs: No Wheelchair Mobility Wheelchair Mobility: No    Exercises     PT Diagnosis:    PT Problem List:   PT Treatment Interventions:     PT Goals Acute Rehab PT Goals PT Goal Formulation: With patient Time For Goal Achievement: 04/06/12 Potential to Achieve Goals: Good PT Goal: Sit to Stand - Progress: Met PT Goal:  Ambulate - Progress: Progressing toward goal  Visit Information  Last PT Received On: 04/03/12 Assistance Needed: +1    Subjective Data  Subjective: "I may get to go home today." Patient Stated Goal: Home and able to do for myself   Cognition  Overall Cognitive Status: Appears within functional limits for tasks assessed/performed Arousal/Alertness: Awake/alert Orientation Level: Appears intact for tasks assessed Behavior During Session: Flat affect    Balance  Balance Balance Assessed: No  End of Session PT - End of Session Equipment Utilized During Treatment: Gait belt Activity Tolerance: Patient tolerated treatment well Patient left: in bed;with call bell/phone within reach (At [facility] Nurse Communication: Mobility status   GP     Cyndia Bent 04/03/2012, 8:03 AM  04/03/2012 Cyndia Bent, PT, DPT [phone]

## 2012-04-04 LAB — CBC
MCHC: 32.8 g/dL (ref 30.0–36.0)
MCV: 81.7 fL (ref 78.0–100.0)
Platelets: 399 10*3/uL (ref 150–400)
RDW: 12.5 % (ref 11.5–15.5)
WBC: 13.2 10*3/uL — ABNORMAL HIGH (ref 4.0–10.5)

## 2012-04-04 LAB — BASIC METABOLIC PANEL
CO2: 30 mEq/L (ref 19–32)
Calcium: 8.7 mg/dL (ref 8.4–10.5)
Creatinine, Ser: 0.94 mg/dL (ref 0.50–1.10)

## 2012-04-04 LAB — GLUCOSE, CAPILLARY: Glucose-Capillary: 151 mg/dL — ABNORMAL HIGH (ref 70–99)

## 2012-04-04 MED ORDER — VANCOMYCIN HCL IN DEXTROSE 1-5 GM/200ML-% IV SOLN
1000.0000 mg | INTRAVENOUS | Status: AC
  Filled 2012-04-04: qty 200

## 2012-04-04 NOTE — Progress Notes (Addendum)
[facility] [place]  Progress Note Bypass Surgery  Date of Surgery: 03/29/2012  Procedure(s): BYPASS GRAFT FEMORAL-TIBIAL ARTERY Surgeon: Surgeon(s): [name], MD  6 Days Post-Op  History of Present Illness  Jenna Brown is a 61 y.o. female who is S/P Procedure(s): BYPASS GRAFT FEMORAL-TIBIAL ARTERY right.  The patient's pre-op symptoms of pain are Improved . Patients pain is well controlled.  She reports decreased sensation in the right foot.      Imaging: No results found.  Significant Diagnostic Studies: CBC Lab Results  Component Value Date   WBC 13.6* 04/01/2012   HGB 9.0* 04/01/2012   HCT 28.0* 04/01/2012   MCV 81.2 04/01/2012   PLT 272 04/01/2012    BMET     Component Value Date/Time   NA 143 03/30/2012 0420   K 4.0 03/30/2012 0420   CL 104 03/30/2012 0420   CO2 26 03/30/2012 0420   GLUCOSE 102* 03/30/2012 0420   BUN 13 03/30/2012 0420   CREATININE 0.80 03/30/2012 0420   CALCIUM 8.9 03/30/2012 0420   GFRNONAA 80* 03/30/2012 0420   GFRAA >90 03/30/2012 0420    COAG Lab Results  Component Value Date   INR 1.71* 04/01/2012   INR 1.09 03/31/2012   INR 1.08 03/30/2012   No results found for this basename: PTT    Physical Examination  BP Readings from Last 3 Encounters:  04/04/12 116/78  04/04/12 116/78  03/27/12 116/76   Temp Readings from Last 3 Encounters:  04/04/12 99 F (37.2 C) Oral  04/04/12 99 F (37.2 C) Oral  03/27/12 98.3 F (36.8 C) Oral   SpO2 Readings from Last 3 Encounters:  04/04/12 97%  04/04/12 97%  03/27/12 97%   Pulse Readings from Last 3 Encounters:  04/04/12 108  04/04/12 108  03/27/12 97    Pt is A&O x 3 right lower extremity: Incision/s is/are clean,dry.intact, and  healing without hematoma, erythema or drainage Limb is warm; with good color.  The right foot is slightly cooler to touch and the toes are darker in color.  This has not shown any changes since surgery.  Doppler + monophasic  doppler signal right PT; motor and sensation in tact right foot.      Assessment/Plan: Will discuss further plan with Dr. [name] Post-op pain is controlled Wounds are clean, dry, intact or healing well PT/OT for ambulation   Jenna Brown [facility] [phone] 04/04/2012 7:43 AM     Plan for thrombectomy of bypass tomorrow   Jenna Brown

## 2012-04-04 NOTE — Progress Notes (Signed)
VASCULAR LAB PRELIMINARY  PRELIMINARY  PRELIMINARY  PRELIMINARY   ABI and graft duplex has been completed.    Preliminary report:  Decreased right ABI and duplex imaging demonstrates graft occluded. Vevelyn Royals, 04/04/2012, 2:43 PM

## 2012-04-04 NOTE — Care Management Note (Unsigned)
    Page 1 of 2   04/06/2012     2:19:34 PM   CARE MANAGEMENT NOTE 04/06/2012  Patient:  Jenna, Brown   Account Number:  [id number]  Date Initiated:  04/04/2012  Documentation initiated by:  SIMMONS,Hilda Wexler  Subjective/Objective Assessment:   ADMITTED WITH PAD; LIVES AT HOME ALONE; [name].     Action/Plan:   DISCHARGE  PLANNING.   Anticipated DC Date:  04/07/2012   Anticipated DC Plan:  [name]  CM consult      PAC Choice  [place]   Choice offered to / List presented to:  C-1 Patient   DME arranged  [place]      DME agency  [place] arranged  HH-1 RN  [place].   Status of service:  In process, will continue to follow Medicare Important Message given?   (If response is "NO", the following Medicare IM given date fields will be blank) Date Medicare IM given:   Date Additional Medicare IM given:    Discharge Disposition:    Per UR Regulation:  Reviewed for med. necessity/level of care/duration of stay  If discussed at Long Length of Stay Meetings, dates discussed:    Comments:  04/05/12  [place], BSN [place] PER PT CHOICE; SOC DATE: WITHIN 24-48HRS POST DISCHARGE; PT STATED SHE HAS "ALOT OF FAMILY" THAT WILL BE HELPING WITH HER CARE POST D/C; MD-  PLEASE ORDER SHOWER CHAIR BEFORE D/C.   04/04/12  1353  Jenna Brown SIMMONS RN, BSN [phone] SCHEDULED FOR RIGHT THROMBECTOMY ON 04/05/12;   NCM WILL FOLLOW.

## 2012-04-04 NOTE — Progress Notes (Signed)
Physical Therapy Treatment Patient Details Name: Jenna Brown MRN: [id number] DOB: 1951-09-09 Today's Date: 04/04/2012 Time: 0805-0828 PT Time Calculation (min): 23 min  PT Assessment / Plan / Recommendation Comments on Treatment Session  Pt admitted s/p right LE bypass graft and continues to progess with increased ambulation distance today as well as increased tolerance.  Pt motivated.    Follow Up Recommendations  Home health PT    Barriers to Discharge        Equipment Recommendations  Tub/shower seat    Recommendations for Other Services    Frequency Min 3X/week   Plan Discharge plan remains appropriate;Frequency remains appropriate    Precautions / Restrictions Precautions Precautions: Fall Restrictions Weight Bearing Restrictions: No   Pertinent Vitals/Pain None.    Mobility  Bed Mobility Bed Mobility: Supine to Sit Supine to Sit: 6: Modified independent (Device/Increase time);HOB elevated (HOB 45 degrees.) Transfers Transfers: Sit to Stand;Stand to Sit Sit to Stand: 5: Supervision;With upper extremity assist;From bed Stand to Sit: 5: Supervision;With upper extremity assist;To bed Details for Transfer Assistance: Verbal cues for safest hand placement and slow descent. Ambulation/Gait Ambulation/Gait Assistance: 5: Supervision Ambulation Distance (Feet): 150 Feet Assistive device: Rolling walker Ambulation/Gait Assistance Details: Verbal cues for tall posture and safe sequence inside RW.  Also encouraged step-through sequence as able. Gait Pattern: Step-to pattern;Decreased step length - right;Decreased stance time - right;Trunk flexed Stairs: No Wheelchair Mobility Wheelchair Mobility: No    Exercises     PT Diagnosis:    PT Problem List:   PT Treatment Interventions:     PT Goals Acute Rehab PT Goals PT Goal Formulation: With patient Time For Goal Achievement: 04/06/12 Potential to Achieve Goals: Good PT Goal: Supine/Side to Sit - Progress:  Progressing toward goal PT Goal: Sit to Stand - Progress: Progressing toward goal PT Goal: Ambulate - Progress: Progressing toward goal  Visit Information  Last PT Received On: 04/04/12 Assistance Needed: +1    Subjective Data  Subjective: "I may need to have surgery again tomorrow." Patient Stated Goal: Home and able to do for myself   Cognition  Overall Cognitive Status: Appears within functional limits for tasks assessed/performed Arousal/Alertness: Awake/alert Orientation Level: Appears intact for tasks assessed Behavior During Session: Flat affect    Balance  Balance Balance Assessed: No  End of Session PT - End of Session Equipment Utilized During Treatment: Gait belt Activity Tolerance: Patient tolerated treatment well Patient left: in bed;with call bell/phone within reach;with nursing in room (Sitting EOB.) Nurse Communication: Mobility status   GP     Cyndia Bent 04/04/2012, 8:33 AM  04/04/2012 Cyndia Bent, PT, DPT [phone]

## 2012-04-05 ENCOUNTER — Encounter (HOSPITAL_COMMUNITY)
Admission: RE | Disposition: A | Discharge status: Home Health | Payer: Self-pay | Source: Ambulatory Visit | Attending: Surgery

## 2012-04-05 ENCOUNTER — Encounter (HOSPITAL_COMMUNITY): Payer: Self-pay | Admitting: Anesthesiology

## 2012-04-05 ENCOUNTER — Inpatient Hospital Stay (HOSPITAL_COMMUNITY): Payer: Medicaid Other | Admitting: Anesthesiology

## 2012-04-05 DIAGNOSIS — T82897A Other specified complication of cardiac prosthetic devices, implants and grafts, initial encounter: Secondary | ICD-10-CM

## 2012-04-05 HISTORY — PX: FEMORAL-POPLITEAL BYPASS GRAFT: SHX937

## 2012-04-05 LAB — APTT: aPTT: 60 seconds — ABNORMAL HIGH (ref 24–37)

## 2012-04-05 LAB — GLUCOSE, CAPILLARY
Glucose-Capillary: 142 mg/dL — ABNORMAL HIGH (ref 70–99)
Glucose-Capillary: 137 mg/dL — ABNORMAL HIGH (ref 70–99)
Glucose-Capillary: 155 mg/dL — ABNORMAL HIGH (ref 70–99)

## 2012-04-05 SURGERY — BYPASS GRAFT FEMORAL-POPLITEAL ARTERY
Anesthesia: General | Site: Leg Upper | Laterality: Right

## 2012-04-05 MED ORDER — MIDAZOLAM HCL 2 MG/2ML IJ SOLN: 0.5000 mg | Freq: Once | INTRAMUSCULAR | Status: DC | PRN

## 2012-04-05 MED ORDER — LACTATED RINGERS IV SOLN
INTRAVENOUS | Status: DC | PRN
  Administered 2012-04-05 (×2): via INTRAVENOUS

## 2012-04-05 MED ORDER — CEFAZOLIN SODIUM 1-5 GM-% IV SOLN
INTRAVENOUS | Status: DC | PRN
  Administered 2012-04-05: 1 g via INTRAVENOUS

## 2012-04-05 MED ORDER — VECURONIUM BROMIDE 10 MG IV SOLR
INTRAVENOUS | Status: DC | PRN
  Administered 2012-04-05: 10 mg via INTRAVENOUS

## 2012-04-05 MED ORDER — PHENYLEPHRINE HCL 10 MG/ML IJ SOLN
10.0000 mg | INTRAVENOUS | Status: DC | PRN
  Administered 2012-04-05: 1 ug/min via INTRAVENOUS

## 2012-04-05 MED ORDER — HEPARIN SODIUM (PORCINE) 1000 UNIT/ML IJ SOLN
INTRAMUSCULAR | Status: DC | PRN
  Administered 2012-04-05: 6000 [IU] via INTRAVENOUS
  Administered 2012-04-05: 2000 [IU] via INTRAVENOUS

## 2012-04-05 MED ORDER — NEOSTIGMINE METHYLSULFATE 1 MG/ML IJ SOLN
INTRAMUSCULAR | Status: DC | PRN
  Administered 2012-04-05: 5 mg via INTRAVENOUS

## 2012-04-05 MED ORDER — PROMETHAZINE HCL 25 MG/ML IJ SOLN: 6.2500 mg | INTRAMUSCULAR | Status: DC | PRN

## 2012-04-05 MED ORDER — MIDAZOLAM HCL 5 MG/5ML IJ SOLN
INTRAMUSCULAR | Status: DC | PRN
  Administered 2012-04-05: 1 mg via INTRAVENOUS

## 2012-04-05 MED ORDER — SODIUM CHLORIDE 0.9 % IR SOLN
Status: DC | PRN
  Administered 2012-04-05: 15:00:00

## 2012-04-05 MED ORDER — SODIUM CHLORIDE 0.9 % IV SOLN
0.1500 mg/kg/h | INTRAVENOUS | Status: DC
  Administered 2012-04-05: 0.15 mg/kg/h via INTRAVENOUS
  Filled 2012-04-05 (×2): qty 250

## 2012-04-05 MED ORDER — FENTANYL CITRATE 0.05 MG/ML IJ SOLN
INTRAMUSCULAR | Status: DC | PRN
  Administered 2012-04-05 (×2): 50 ug via INTRAVENOUS
  Administered 2012-04-05: 200 ug via INTRAVENOUS

## 2012-04-05 MED ORDER — PROPOFOL 10 MG/ML IV EMUL
INTRAVENOUS | Status: DC | PRN
  Administered 2012-04-05: 120 mg via INTRAVENOUS

## 2012-04-05 MED ORDER — HYDROMORPHONE HCL PF 1 MG/ML IJ SOLN
0.2500 mg | INTRAMUSCULAR | Status: DC | PRN
  Administered 2012-04-05: 0.25 mg via INTRAVENOUS

## 2012-04-05 MED ORDER — HYDROMORPHONE HCL PF 1 MG/ML IJ SOLN
INTRAMUSCULAR | Status: AC
  Filled 2012-04-05: qty 1

## 2012-04-05 MED ORDER — GLYCOPYRROLATE 0.2 MG/ML IJ SOLN
INTRAMUSCULAR | Status: DC | PRN
  Administered 2012-04-05: .6 mg via INTRAVENOUS

## 2012-04-05 MED ORDER — LIDOCAINE HCL (CARDIAC) 20 MG/ML IV SOLN
INTRAVENOUS | Status: DC | PRN
  Administered 2012-04-05: 40 mg via INTRAVENOUS

## 2012-04-05 MED ORDER — MEPERIDINE HCL 25 MG/ML IJ SOLN: 6.2500 mg | INTRAMUSCULAR | Status: DC | PRN

## 2012-04-05 MED ORDER — SODIUM CHLORIDE 0.9 % IV SOLN
1.7500 mg/kg/h | INTRAVENOUS | Status: DC
  Filled 2012-04-05: qty 250

## 2012-04-05 MED ORDER — LACTATED RINGERS IV SOLN
INTRAVENOUS | Status: DC
  Administered 2012-04-05 (×2): via INTRAVENOUS

## 2012-04-05 MED ORDER — ONDANSETRON HCL 4 MG/2ML IJ SOLN
INTRAMUSCULAR | Status: DC | PRN
  Administered 2012-04-05: 4 mg via INTRAVENOUS

## 2012-04-05 MED ORDER — 0.9 % SODIUM CHLORIDE (POUR BTL) OPTIME
TOPICAL | Status: DC | PRN
  Administered 2012-04-05: 2000 mL

## 2012-04-05 MED ORDER — HEMOSTATIC AGENTS (NO CHARGE) OPTIME
TOPICAL | Status: DC | PRN
  Administered 2012-04-05: 1 via TOPICAL

## 2012-04-05 MED ORDER — CEFAZOLIN SODIUM 1-5 GM-% IV SOLN
INTRAVENOUS | Status: AC
  Filled 2012-04-05: qty 50

## 2012-04-05 SURGICAL SUPPLY — 74 items
ADH SKN CLS APL DERMABOND .7 (GAUZE/BANDAGES/DRESSINGS) ×1
BANDAGE ELASTIC 4 VELCRO ST LF (GAUZE/BANDAGES/DRESSINGS) IMPLANT
BANDAGE ESMARK 6X9 LF (GAUZE/BANDAGES/DRESSINGS) IMPLANT
BNDG CMPR 9X6 STRL LF SNTH (GAUZE/BANDAGES/DRESSINGS) ×1
BNDG ESMARK 6X9 LF (GAUZE/BANDAGES/DRESSINGS) ×2
CANISTER SUCTION 2500CC (MISCELLANEOUS) ×2 IMPLANT
CATH EMB 3FR 80CM (CATHETERS) ×2 IMPLANT
CATH EMB 4FR 80CM (CATHETERS) ×2 IMPLANT
CATH EMB 5FR 80CM (CATHETERS) ×1 IMPLANT
CLIP TI MEDIUM 24 (CLIP) ×2 IMPLANT
CLIP TI WIDE RED SMALL 24 (CLIP) ×2 IMPLANT
CLOTH BEACON ORANGE TIMEOUT ST (SAFETY) ×2 IMPLANT
COVER SURGICAL LIGHT HANDLE (MISCELLANEOUS) ×2 IMPLANT
CUFF TOURNIQUET SINGLE 24IN (TOURNIQUET CUFF) IMPLANT
CUFF TOURNIQUET SINGLE 34IN LL (TOURNIQUET CUFF) ×1 IMPLANT
CUFF TOURNIQUET SINGLE 44IN (TOURNIQUET CUFF) IMPLANT
DERMABOND ADVANCED (GAUZE/BANDAGES/DRESSINGS) ×1
DERMABOND ADVANCED .7 DNX12 (GAUZE/BANDAGES/DRESSINGS) ×1 IMPLANT
DRAIN CHANNEL 15F RND FF W/TCR (WOUND CARE) IMPLANT
DRAPE WARM FLUID 44X44 (DRAPE) ×2 IMPLANT
DRAPE X-RAY CASS 24X20 (DRAPES) IMPLANT
DRSG COVADERM 4X10 (GAUZE/BANDAGES/DRESSINGS) IMPLANT
DRSG COVADERM 4X8 (GAUZE/BANDAGES/DRESSINGS) IMPLANT
ELECT REM PT RETURN 9FT ADLT (ELECTROSURGICAL) ×2
ELECTRODE REM PT RTRN 9FT ADLT (ELECTROSURGICAL) ×1 IMPLANT
EVACUATOR SILICONE 100CC (DRAIN) IMPLANT
GLOVE BIOGEL PI IND STRL 6.5 (GLOVE) IMPLANT
GLOVE BIOGEL PI IND STRL 7.5 (GLOVE) ×1 IMPLANT
GLOVE BIOGEL PI INDICATOR 6.5 (GLOVE) ×1
GLOVE BIOGEL PI INDICATOR 7.5 (GLOVE) ×3
GLOVE ECLIPSE 6.5 STRL STRAW (GLOVE) ×1 IMPLANT
GLOVE ECLIPSE 7.0 STRL STRAW (GLOVE) ×2 IMPLANT
GLOVE SS BIOGEL STRL SZ 7 (GLOVE) ×1 IMPLANT
GLOVE SUPERSENSE BIOGEL SZ 7 (GLOVE) ×1
GLOVE SURG SS PI 7.5 STRL IVOR (GLOVE) ×3 IMPLANT
GOWN PREVENTION PLUS XXLARGE (GOWN DISPOSABLE) ×2 IMPLANT
GOWN STRL NON-REIN LRG LVL3 (GOWN DISPOSABLE) ×4 IMPLANT
HEMOSTAT SNOW SURGICEL 2X4 (HEMOSTASIS) ×1 IMPLANT
HEMOSTAT SURGICEL 2X14 (HEMOSTASIS) IMPLANT
KIT BASIN OR (CUSTOM PROCEDURE TRAY) ×2 IMPLANT
KIT ROOM TURNOVER OR (KITS) ×2 IMPLANT
MARKER GRAFT CORONARY BYPASS (MISCELLANEOUS) IMPLANT
NDL SAFETY ECLIPSE 18X1.5 (NEEDLE) IMPLANT
NEEDLE HYPO 18GX1.5 SHARP (NEEDLE) ×2
NS IRRIG 1000ML POUR BTL (IV SOLUTION) ×4 IMPLANT
PACK PERIPHERAL VASCULAR (CUSTOM PROCEDURE TRAY) ×2 IMPLANT
PAD ARMBOARD 7.5X6 YLW CONV (MISCELLANEOUS) ×4 IMPLANT
PADDING CAST ABS 6INX4YD NS (CAST SUPPLIES) ×1
PADDING CAST ABS COTTON 6X4 NS (CAST SUPPLIES) IMPLANT
PADDING CAST COTTON 6X4 STRL (CAST SUPPLIES) ×1 IMPLANT
PATCH VASCULAR VASCU GUARD 1X6 (Vascular Products) ×1 IMPLANT
SET COLLECT BLD 21X3/4 12 (NEEDLE) IMPLANT
STAPLER VISISTAT 35W (STAPLE) ×1 IMPLANT
STOPCOCK 4 WAY LG BORE MALE ST (IV SETS) IMPLANT
SUT ETHILON 3 0 PS 1 (SUTURE) IMPLANT
SUT PROLENE 5 0 C 1 24 (SUTURE) ×2 IMPLANT
SUT PROLENE 6 0 BV (SUTURE) ×9 IMPLANT
SUT PROLENE 7 0 BV 1 (SUTURE) IMPLANT
SUT SILK 3 0 (SUTURE)
SUT SILK 3-0 18XBRD TIE 12 (SUTURE) IMPLANT
SUT VIC AB 2-0 CT1 27 (SUTURE) ×4
SUT VIC AB 2-0 CT1 TAPERPNT 27 (SUTURE) ×2 IMPLANT
SUT VIC AB 3-0 SH 27 (SUTURE) ×4
SUT VIC AB 3-0 SH 27X BRD (SUTURE) ×2 IMPLANT
SUT VICRYL 4-0 PS2 18IN ABS (SUTURE) ×4 IMPLANT
SYR 3ML LL SCALE MARK (SYRINGE) ×1 IMPLANT
SYR TB 1ML LUER SLIP (SYRINGE) ×1 IMPLANT
SYRINGE 10CC LL (SYRINGE) ×1 IMPLANT
TOWEL OR 17X24 6PK STRL BLUE (TOWEL DISPOSABLE) ×4 IMPLANT
TOWEL OR 17X26 10 PK STRL BLUE (TOWEL DISPOSABLE) ×4 IMPLANT
TRAY FOLEY CATH 14FRSI W/METER (CATHETERS) ×2 IMPLANT
TUBING EXTENTION W/L.L. (IV SETS) IMPLANT
UNDERPAD 30X30 INCONTINENT (UNDERPADS AND DIAPERS) ×2 IMPLANT
WATER STERILE IRR 1000ML POUR (IV SOLUTION) ×2 IMPLANT

## 2012-04-05 NOTE — Brief Op Note (Signed)
03/29/2012 - 04/05/2012  9:06 PM  PATIENT:  Jenna Brown  61 y.o. female  PRE-OPERATIVE DIAGNOSIS:  PVD  POST-OPERATIVE DIAGNOSIS:  peripheral vascular disease  PROCEDURE:  Procedure(s) (LRB): BYPASS GRAFT FEMORAL-POPLITEAL ARTERY (Right)  SURGEON:  Surgeon(s) and Role:    [name], MD - Primary  PHYSICIAN ASSISTANT:   ASSISTANTS: Wray Kearns, P.A.   ANESTHESIA:   general  EBL:     BLOOD ADMINISTERED:2 units CC PRBC  DRAINS: none   LOCAL MEDICATIONS USED:  NONE  SPECIMEN:  Source of Specimen:  bypass graft thrombus  DISPOSITION OF SPECIMEN:  PATHOLOGY  COUNTS:  YES  TOURNIQUET:  * Missing tourniquet times found for documented tourniquets in log:  [id number] *  DICTATION: .Dragon Dictation  PLAN OF CARE: Admit to inpatient   PATIENT DISPOSITION:  PACU - hemodynamically stable.   Delay start of Pharmacological VTE agent (>24hrs) due to surgical blood loss or risk of bleeding: no

## 2012-04-05 NOTE — Transfer of Care (Signed)
Immediate Anesthesia Transfer of Care Note  Patient: Jenna Brown  Procedure(s) Performed: Procedure(s) (LRB): BYPASS GRAFT FEMORAL-POPLITEAL ARTERY (Right)  Patient Location: PACU  Anesthesia Type: General  Level of Consciousness: awake  Airway & Oxygen Therapy: Patient Spontanous Breathing and Patient connected to face mask oxygen  Post-op Assessment: Report given to PACU RN and Post -op Vital signs reviewed and stable  Post vital signs: Reviewed and stable  Complications: No apparent anesthesia complications

## 2012-04-05 NOTE — Progress Notes (Signed)
ANTICOAGULATION CONSULT NOTE - Follow up [facility] for bivalirudin Indication: PVD, s/p fem-pop  Allergies  Allergen Reactions  . Codeine Other (See Comments)    "makes me feel strange"  . Plavix (Clopidogrel Bisulfate) Itching  . Tylenol (Acetaminophen) Other (See Comments)    "Doesn't feel right"   . Tylenol With Codeine #3 (Acetaminophen-Codeine) Other (See Comments)    unknown    Patient Measurements: Height: 5\' 4"  (162.6 cm) Weight: 162 lb 0.6 oz (73.5 kg) IBW/kg (Calculated) : 54.7    Vital Signs: Temp: 99.5 F (37.5 C) (07/18 2145) Temp src: Oral (07/18 1329) BP: 138/60 mmHg (07/18 2145) Pulse Rate: 109  (07/18 2145)  Labs:  Basename 04/05/12 2057 04/04/12 1636  HGB -- 8.8*  HCT -- 26.8*  PLT -- 399  APTT 60* --  LABPROT -- --  INR -- --  HEPARINUNFRC -- --  CREATININE -- 0.94  CKTOTAL -- --  CKMB -- --  TROPONINI -- --    Estimated Creatinine Clearance: 64.1 ml/min (by C-G formula based on Cr of 0.94).   Medical History: Past Medical History  Diagnosis Date  . Hyperlipidemia     takes Simvastatin daily  . Peripheral vascular disease   . Stroke     TIA history  . PONV (postoperative nausea and vomiting)   . Hypertension     takes Amlodipine/HCTZ/Losartan daily  . Myocardial infarction 1990's  . Headache     when b/p is elevated  . Vertigo     takes Ativert prn  . Hemorrhoids   . Diabetes mellitus     takes Glucovance and Januvia daily  . Anxiety     takes Xanax prn    Medications:  Prescriptions prior to admission  Medication Sig Dispense Refill  . ALPRAZolam (XANAX) 0.25 MG tablet Take 0.25 mg by mouth daily as needed. For anxiety      . amLODipine (NORVASC) 10 MG tablet Take 10 mg by mouth every morning.       Marland Kitchen aspirin 81 MG chewable tablet Chew 81 mg by mouth every other day.      . glyBURIDE-metformin (GLUCOVANCE) 5-500 MG per tablet Take 2 tablets by mouth 2 (two) times daily with a meal.       .  hydrochlorothiazide (HYDRODIURIL) 25 MG tablet Take 25 mg by mouth every morning.       Marland Kitchen ibuprofen (ADVIL,MOTRIN) 200 MG tablet Take 400 mg by mouth every 8 (eight) hours as needed. For pain      . losartan (COZAAR) 100 MG tablet Take 100 mg by mouth every morning.       . meclizine (ANTIVERT) 25 MG tablet Take 25 mg by mouth daily as needed. For vertigo      . simvastatin (ZOCOR) 40 MG tablet Take 40 mg by mouth at bedtime.      . sitaGLIPtin (JANUVIA) 100 MG tablet Take 100 mg by mouth every morning.        Assessment: 61 yo lady to start bivalirudin s/p fem-pop. She has clotted off several grafts in the past.  Initial aPTT is therapeutic at 60 sec. Goal of Therapy:  aPTT 50-85 seconds Monitor platelets by anticoagulation protocol: Yes   Plan:  Cont bivalirudin at 0.15 mg/kg/hr. Confirn aPTT in 4 hours  Ladesha Pacini Poteet 04/05/2012,10:11 PM

## 2012-04-05 NOTE — Op Note (Signed)
[facility]  Patient name: Jenna Brown MRN: [id number] DOB: May 19, 1951 Sex: female  03/29/2012 - 04/05/2012 Pre-operative Diagnosis: Occluded right leg bypass graft Post-operative diagnosis:  Same Surgeon:  [name] Assistants:  Wray Kearns, P.A. Procedure:    1.)  thrombectomy and revision right femoral posterior tibial bypass graft   2.)  Redo right common femoral and posterior tibial artery exposure   3.)  Patch angioplasty right posterior tibial artery bypass graft with bovine patch Anesthesia:  General Blood Loss:  See anesthesia record Specimens:  BPG thrombus  Findings:  Acute thrombus in graft.  I explored both anastomoses.  There was no technical explanation for failure.  I replaced the vein patch on the posterior tibial artery with a bovine patch.  Angiomax was used post op instead of heparin.  Likely failure is due to small posterior tibial artery.  I could pass a #3 fogarty catheter down across the ankle.  There was a good posterior tibial signal after the case  Indications:  The patient was admitted following a right femoral to posterior tibial artery bypass graft with Lovena Le patch. Unfortunately, this occluded. 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 I suspect that she has having these issues do to her small vessel size. Regardless she continues to have an ulcer on her right toe and therefore I believe we need to proceed with another attempt at revascularization. She had been on Xeralto prior to her procedure. Additionally 48 hours before proceeding again.  Procedure:  The patient was identified in the holding area and taken to [place] [facility]  The patient was then placed supine on the table. general anesthesia was administered.  The patient was prepped and draped in the usual sterile fashion.  A time out was called and antibiotics were administered.  I reopened the patient's below knee incision. The bypass  graft and posterior tibial artery were dissected free. The graft was occluded. I didn't heparinize the patient at this time. I removed the vein patch from the graft. I passed a #3 Fogarty catheter down on to the foot I remove some thrombus and had good backbleeding. A Serafin clamp was used to occlude the artery distally. I was also able to open up the proximal posterior tibial artery with a #3 Fogarty catheter. I then proceeded with thrombectomy of the graft. I had difficulty getting the Fogarty catheter to pass the proximal anastomosis. I used a #3, #4, and #5 catheter. Because this was so difficult I elected to reopen the groin incision. I made a transverse graftotomy at the proximal anastomosis. I removed some thrombus and established good inflow. I did not see a technical reason for bypass graft failure at the proximal anastomosis. I suspect that the catheter did not get to the groin because of the course of the bypass graft. I removed a couple of the external range and change the orientation of the course of the bypass graft in hopes to not have any problems in this area. I closed the graftotomy with a running 6-0 Prolene. I then elected to use a bovine patch on the distal anastomosis. My thought was that the vein patch was probably the reason this occluded because she has had issues with her vein in the past. Patch angioplasty of the posterior tibial artery was performed with a bovine pericardial patch and a 6-0 Prolene. Prior to completion the appropriate flushing maneuvers were performed and the anastomosis  was completed. There was an excellent Doppler signal in the posterior tibial artery at the ankle. And I did not reverse the heparin. I elected to start Angiomax in the PACU. There is no evidence of HIT syndrome however I wanted to put her on different anticoagulation since she had issues in the past. Once hemostasis was acceptable the fascia was reapproximated with 2-0 Vicryl the subcutaneous tissue  closed with 3-0 Vicryl the skin closed with staples, and the below knee incision. In the groin incision I closed the femoral sheath with 2-0 Vicryl. The subcutaneous tissue was closed in multiple layers of 3-0 Vicryl and the skin was stapled close. Patient tolerated the procedure well there no complications.   Disposition:  To PACU in stable condition.   Theotis Burrow, M.D. [facility] Office: [phone] Pager:  [phone]

## 2012-04-05 NOTE — Progress Notes (Signed)
During transfer of patient from PACU and to 3300 bed, during handoff report I was unable to dopple a DP pulse in right foot.  Doppled PT.  Dr. Bridgett Larsson notified of events.  No new orders given at this time.

## 2012-04-05 NOTE — H&P (View-Only) (Signed)
[facility]   Progress Note Bypass Surgery  Date of Surgery: 03/29/2012  Procedure(s): BYPASS GRAFT FEMORAL-TIBIAL ARTERY Surgeon: Surgeon(s): [name], MD  6 Days Post-Op  History of Present Illness  Jenna Brown is a 61 y.o. female who is S/P Procedure(s): BYPASS GRAFT FEMORAL-TIBIAL ARTERY right.  The patient's pre-op symptoms of pain are Improved . Patients pain is well controlled.  She reports decreased sensation in the right foot.      Imaging: No results found.  Significant Diagnostic Studies: CBC Lab Results  Component Value Date   WBC 13.6* 04/01/2012   HGB 9.0* 04/01/2012   HCT 28.0* 04/01/2012   MCV 81.2 04/01/2012   PLT 272 04/01/2012    BMET     Component Value Date/Time   NA 143 03/30/2012 0420   K 4.0 03/30/2012 0420   CL 104 03/30/2012 0420   CO2 26 03/30/2012 0420   GLUCOSE 102* 03/30/2012 0420   BUN 13 03/30/2012 0420   CREATININE 0.80 03/30/2012 0420   CALCIUM 8.9 03/30/2012 0420   GFRNONAA 80* 03/30/2012 0420   GFRAA >90 03/30/2012 0420    COAG Lab Results  Component Value Date   INR 1.71* 04/01/2012   INR 1.09 03/31/2012   INR 1.08 03/30/2012   No results found for this basename: PTT    Physical Examination  BP Readings from Last 3 Encounters:  04/04/12 116/78  04/04/12 116/78  03/27/12 116/76   Temp Readings from Last 3 Encounters:  04/04/12 99 F (37.2 C) Oral  04/04/12 99 F (37.2 C) Oral  03/27/12 98.3 F (36.8 C) Oral   SpO2 Readings from Last 3 Encounters:  04/04/12 97%  04/04/12 97%  03/27/12 97%   Pulse Readings from Last 3 Encounters:  04/04/12 108  04/04/12 108  03/27/12 97    Pt is A&O x 3 right lower extremity: Incision/s is/are clean,dry.intact, and  healing without hematoma, erythema or drainage Limb is warm; with good color.  The right foot is slightly cooler to touch and the toes are darker in color.  This has not shown any changes since surgery.  Doppler + monophasic  doppler signal right PT; motor and sensation in tact right foot.      Assessment/Plan: Will discuss further plan with Dr. [name] Post-op pain is controlled Wounds are clean, dry, intact or healing well PT/OT for ambulation   [name] [facility] [id number] 04/04/2012 7:43 AM     Plan for thrombectomy of bypass tomorrow   Annamarie Major

## 2012-04-05 NOTE — Progress Notes (Signed)
ANTICOAGULATION CONSULT NOTE - Initial Consult  Pharmacy Consult for bivalirudin Indication: PVD, s/p fem-pop  Allergies  Allergen Reactions  . Codeine Other (See Comments)    "makes me feel strange"  . Plavix (Clopidogrel Bisulfate) Itching  . Tylenol (Acetaminophen) Other (See Comments)    "Doesn't feel right"   . Tylenol With Codeine #3 (Acetaminophen-Codeine) Other (See Comments)    unknown    Patient Measurements: Height: 5\' 4"  (162.6 cm) Weight: 162 lb 0.6 oz (73.5 kg) IBW/kg (Calculated) : 54.7    Vital Signs: Temp: 98.7 F (37.1 C) (07/18 1329) Temp src: Oral (07/18 1329) BP: 102/65 mmHg (07/18 1329) Pulse Rate: 92  (07/18 1329)  Labs:  [facility] 04/04/12 1636  HGB 8.8*  HCT 26.8*  PLT 399  APTT --  LABPROT --  INR --  HEPARINUNFRC --  CREATININE 0.94  CKTOTAL --  CKMB --  TROPONINI --    Estimated Creatinine Clearance: 64.1 ml/min (by C-G formula based on Cr of 0.94).   Medical History: Past Medical History  Diagnosis Date  . Hyperlipidemia     takes Simvastatin daily  . Peripheral vascular disease   . Stroke     TIA history  . PONV (postoperative nausea and vomiting)   . Hypertension     takes Amlodipine/HCTZ/Losartan daily  . Myocardial infarction 1990's  . Headache     when b/p is elevated  . Vertigo     takes Ativert prn  . Hemorrhoids   . Diabetes mellitus     takes Glucovance and Januvia daily  . Anxiety     takes Xanax prn    Medications:  Prescriptions prior to admission  Medication Sig Dispense Refill  . ALPRAZolam (XANAX) 0.25 MG tablet Take 0.25 mg by mouth daily as needed. For anxiety      . amLODipine (NORVASC) 10 MG tablet Take 10 mg by mouth every morning.       Marland Kitchen aspirin 81 MG chewable tablet Chew 81 mg by mouth every other day.      . glyBURIDE-metformin (GLUCOVANCE) 5-500 MG per tablet Take 2 tablets by mouth 2 (two) times daily with a meal.       . hydrochlorothiazide (HYDRODIURIL) 25 MG tablet Take 25 mg by  mouth every morning.       Marland Kitchen ibuprofen (ADVIL,MOTRIN) 200 MG tablet Take 400 mg by mouth every 8 (eight) hours as needed. For pain      . losartan (COZAAR) 100 MG tablet Take 100 mg by mouth every morning.       . meclizine (ANTIVERT) 25 MG tablet Take 25 mg by mouth daily as needed. For vertigo      . simvastatin (ZOCOR) 40 MG tablet Take 40 mg by mouth at bedtime.      . sitaGLIPtin (JANUVIA) 100 MG tablet Take 100 mg by mouth every morning.        Assessment: 61 yo lady to start bivalirudin s/p fem-pop. She has clotted off several grafts in the past. Goal of Therapy:  aPTT 50-85 seconds Monitor platelets by anticoagulation protocol: Yes   Plan:  Start bivalirudin at 0.15 mg/kg/hr. Check aPTT 2 hours after start.  Excell Seltzer Poteet 04/05/2012,5:53 PM

## 2012-04-05 NOTE — Anesthesia Postprocedure Evaluation (Signed)
  Anesthesia Post-op Note  Patient: Jenna Brown  Procedure(s) Performed: Procedure(s) (LRB): BYPASS GRAFT FEMORAL-POPLITEAL ARTERY (Right)  Patient Location: PACU  Anesthesia Type: General  Level of Consciousness: awake  Airway and Oxygen Therapy: Patient Spontanous Breathing and Patient connected to nasal cannula oxygen  Post-op Pain: none  Post-op Assessment: Post-op Vital signs reviewed, Patient's Cardiovascular Status Stable, Respiratory Function Stable, Patent Airway and No signs of Nausea or vomiting  Post-op Vital Signs: Reviewed and stable  Complications: No apparent anesthesia complications

## 2012-04-05 NOTE — Progress Notes (Signed)
Handoff report received from [place].  She stated that Dr. [name] wanted Angiomax gtt started in pacu.  Gtt at bedside. Order confirmed.  Gtt started at prescribed rate and dose.

## 2012-04-05 NOTE — Anesthesia Preprocedure Evaluation (Addendum)
Anesthesia Evaluation  Patient identified by MRN, date of birth, ID band Patient awake    Reviewed: Allergy & Precautions, H&P , NPO status , Patient's Chart, lab work & pertinent test results, reviewed documented beta blocker date and time   History of Anesthesia Complications (+) PONV  Airway Mallampati: II TM Distance: >3 FB Neck ROM: full    Dental  (+) Edentulous Upper and Edentulous Lower   Pulmonary Current Smoker,  breath sounds clear to auscultation  Pulmonary exam normal       Cardiovascular hypertension, On Medications + Past MI and + Peripheral Vascular Disease (s/p aorto-fem, fem-pop, fem-post tib bypasses) Rhythm:Regular Rate:Normal  '12 stress myoview: no ischemia, EF 62%   Neuro/Psych  Headaches, CVA (L weakness), Residual Symptoms negative psych ROS   GI/Hepatic Neg liver ROS, GERD-  Controlled,  Endo/Other  Well Controlled, Type 2, Oral Hypoglycemic Agents  Renal/GU negative Renal ROS     Musculoskeletal   Abdominal (+) + obese,   Peds  Hematology  (+) Blood dyscrasia, anemia ,   Anesthesia Other Findings S/p cleft lip and palate repair  Reproductive/Obstetrics                         Anesthesia Physical Anesthesia Plan  ASA: III  Anesthesia Plan: General   Post-op Pain Management:    Induction: Intravenous  Airway Management Planned: Oral ETT  Additional Equipment:   Intra-op Plan:   Post-operative Plan: Extubation in OR  Informed Consent: I have reviewed the patients History and Physical, chart, labs and discussed the procedure including the risks, benefits and alternatives for the proposed anesthesia with the patient or authorized representative who has indicated his/her understanding and acceptance.     Plan Discussed with: Anesthesiologist, Surgeon and CRNA  Anesthesia Plan Comments: (Plan routine monitors, GETA)       Anesthesia Quick Evaluation

## 2012-04-05 NOTE — Interval H&P Note (Signed)
History and Physical Interval Note:  04/05/2012 1:55 PM  Jenna Brown  has presented today for surgery, with the diagnosis of PVD  The various methods of treatment have been discussed with the patient and family. After consideration of risks, benefits and other options for treatment, the patient has consented to  Procedure(s) (LRB): BYPASS GRAFT FEMORAL-POPLITEAL ARTERY (Right) as a surgical intervention .  The patient's history has been reviewed, patient examined, no change in status, stable for surgery.  I have reviewed the patients' chart and labs.  Questions were answered to the patient's satisfaction.     Airyana Sprunger IV, V. WELLS

## 2012-04-05 NOTE — Preoperative (Signed)
Beta Blockers   Reason not to administer Beta Blockers:Not Applicable 

## 2012-04-05 NOTE — OR Nursing (Signed)
Nitroglycerin 50mg  in 5% Dextrose Injection (250mcg/mL) requested for use intraoperatively by Dr. [name].  440mcg injected into right posterior tibial artery.

## 2012-04-06 ENCOUNTER — Encounter (HOSPITAL_COMMUNITY): Payer: Self-pay | Admitting: Surgery

## 2012-04-06 LAB — BASIC METABOLIC PANEL
CO2: 28 mEq/L (ref 19–32)
Chloride: 97 mEq/L (ref 96–112)
Creatinine, Ser: 0.85 mg/dL (ref 0.50–1.10)
GFR calc Af Amer: 86 mL/min — ABNORMAL LOW (ref >90)
Potassium: 3.8 mEq/L (ref 3.5–5.1)
Sodium: 139 mEq/L (ref 135–145)

## 2012-04-06 LAB — GLUCOSE, CAPILLARY

## 2012-04-06 LAB — APTT: aPTT: 59 seconds — ABNORMAL HIGH (ref 24–37)

## 2012-04-06 LAB — CBC
HCT: 31.7 % — ABNORMAL LOW (ref 36.0–46.0)
Hemoglobin: 10.6 g/dL — ABNORMAL LOW (ref 12.0–15.0)
MCV: 80.1 fL (ref 78.0–100.0)
RBC: 3.96 MIL/uL (ref 3.87–5.11)
RDW: 13.2 % (ref 11.5–15.5)
WBC: 18.7 10*3/uL — ABNORMAL HIGH (ref 4.0–10.5)

## 2012-04-06 NOTE — Progress Notes (Signed)
Pt report called to 2000 RN. VSS. All meds given up to current time.

## 2012-04-06 NOTE — Progress Notes (Signed)
ANTICOAGULATION CONSULT NOTE - Follow Up Consult  Pharmacy Consult for Angiomax Indication: PVD, s/p fem-pop  Labs:  Basename 04/06/12 0152 04/05/12 2057 04/04/12 1636  HGB 10.6* -- 8.8*  HCT 31.7* -- 26.8*  PLT 414* -- 399  APTT 59* 60* --  LABPROT -- -- --  INR -- -- --  HEPARINUNFRC -- -- --  CREATININE 0.85 -- 0.94  CKTOTAL -- -- --  CKMB -- -- --  TROPONINI -- -- --    Assessment/Plan: 61yo female remains therapeutic on Angiomax.  Will continue gtt at current rate and continue to monitor.  Jenna Brown PharmD BCPS 04/06/2012,3:37 AM

## 2012-04-06 NOTE — Progress Notes (Signed)
Physical Therapy Treatment Patient Details Name: Jenna Brown MRN: [id number] DOB: April 07, 1951 Today's Date: 04/06/2012 Time: [id number] PT Time Calculation (min): 22 min  PT Assessment / Plan / Recommendation Comments on Treatment Session  Pt admitted s/p right LE bypass graft and continues to progess with increased ambulation distance today as well as increased tolerance.  Pt motivated.    Follow Up Recommendations  Home health PT    Barriers to Discharge        Equipment Recommendations  3 in 1 bedside comode;Tub/shower seat    Recommendations for Other Services    Frequency Min 3X/week   Plan Discharge plan remains appropriate;Frequency remains appropriate    Precautions / Restrictions Precautions Precautions: Fall Restrictions Weight Bearing Restrictions: No   Pertinent Vitals/Pain     Mobility  Bed Mobility Bed Mobility: Not assessed Transfers Transfers: Sit to Stand;Stand to Sit Sit to Stand: From chair/3-in-1;4: Min assist Stand to Sit: 4: Min assist Details for Transfer Assistance: vc's for hand placement and assist slow decent when sitting.  Ambulation/Gait Ambulation/Gait Assistance: 4: Min guard Ambulation Distance (Feet): 110 Feet Assistive device: Rolling walker Ambulation/Gait Assistance Details: vc's for sequencing and attempt to equalize step length; postural checks stability assist Gait Pattern: Step-through pattern;Decreased step length - left;Decreased stance time - right;Decreased stride length Stairs: No    Exercises     PT Diagnosis:    PT Problem List:   PT Treatment Interventions:     PT Goals Acute Rehab PT Goals Time For Goal Achievement: 04/06/12 Potential to Achieve Goals: Good PT Goal: Sit to Stand - Progress: Progressing toward goal PT Transfer Goal: Bed to Chair/Chair to Bed - Progress: Progressing toward goal PT Goal: Ambulate - Progress: Progressing toward goal  Visit Information  Last PT Received On: 04/06/12 Assistance  Needed: +1    Subjective Data      Cognition  Overall Cognitive Status: Appears within functional limits for tasks assessed/performed Arousal/Alertness: Awake/alert Orientation Level: Appears intact for tasks assessed Behavior During Session: [facility] for tasks performed    Balance     End of Session PT - End of Session Equipment Utilized During Treatment: Gait belt Activity Tolerance: Patient tolerated treatment well Patient left: in chair;with call bell/phone within reach Nurse Communication: Mobility status   GP     Kathrin Folden, Tessie Fass 04/06/2012, 12:54 PM   04/06/2012  Donnella Sham, [place] [phone] (pager)

## 2012-04-06 NOTE — Progress Notes (Addendum)
VASCULAR [facility]IN SPECIALISTS OF [place]  Progress Note Bypass Surgery  Date of Surgery: 03/29/2012 - 04/05/2012  Procedure(s): BYPASS GRAFT FEMORAL-POPLITEAL ARTERY Surgeon: Surgeon(s): [name], MD  1 Day Post-Op  History of Present Illness  Jenna Brown is a 61 y.o. female who is S/P Procedure(s): BYPASS GRAFT FEMORAL-POPLITEAL ARTERY right.  The patient's pre-op symptoms of pain are Improved . Patients pain is well controlled.    VASC. LAB Studies: pending   Imaging: No results found.  Significant Diagnostic Studies: CBC Lab Results  Component Value Date   WBC 18.7* 04/06/2012   HGB 10.6* 04/06/2012   HCT 31.7* 04/06/2012   MCV 80.1 04/06/2012   PLT 414* 04/06/2012    BMET     Component Value Date/Time   NA 139 04/06/2012 0152   K 3.8 04/06/2012 0152   CL 97 04/06/2012 0152   CO2 28 04/06/2012 0152   GLUCOSE 194* 04/06/2012 0152   BUN 12 04/06/2012 0152   CREATININE 0.85 04/06/2012 0152   CALCIUM 8.8 04/06/2012 0152   GFRNONAA 74* 04/06/2012 0152   GFRAA 86* 04/06/2012 0152    COAG Lab Results  Component Value Date   INR 1.71* 04/01/2012   INR 1.09 03/31/2012   INR 1.08 03/30/2012   No results found for this basename: PTT    Physical Examination  BP Readings from Last 3 Encounters:  04/06/12 116/74  04/06/12 116/74  04/06/12 116/74   Temp Readings from Last 3 Encounters:  04/06/12 98.7 F (37.1 C) Oral  04/06/12 98.7 F (37.1 C) Oral  04/06/12 98.7 F (37.1 C) Oral   SpO2 Readings from Last 3 Encounters:  04/05/12 99%  04/05/12 99%  04/05/12 99%   Pulse Readings from Last 3 Encounters:  04/06/12 109  04/06/12 109  04/06/12 109    Pt is A&O x 3 right lower extremity: Incision/s is/are clean,dry.intact, and  clean, dry, intact or healing without hematoma, erythema or drainage Limb is warm; with good color  Doppler PT brisk and peroneal Dp doppler weak  Assessment/Plan: Pt. Doing well Post-op pain is controlled Wounds are clean,  dry, intact PT/OT for ambulation We will discuss with Dr. [name] long term anticoagulation options.  Continue Angiomax per pharmacy dosing. Transfer to [facility] [id number] 04/06/2012 7:24 AM

## 2012-04-06 NOTE — Progress Notes (Signed)
Occupational Therapy Evaluation Patient Details Name: Jenna Brown MRN: [id number] DOB: 1951/03/26 Today's Date: 04/06/2012 Time: [id number] OT Time Calculation (min): 39 min  OT Assessment / Plan / Recommendation Clinical Impression  61 y.o. s/p Rt. bypass graft femoral-popliteal artery. Pt. with decreased balance and would benefit from OT in acute care setting to maximize independence and safety in ADLs prior to d/c. OT to follow acutely.    OT Assessment  Patient needs continued OT Services    Follow Up Recommendations  Home health OT    Barriers to Discharge      Equipment Recommendations  3 in 1 bedside comode;Tub/shower seat    Recommendations for Other Services    Frequency  Min 2X/week    Precautions / Restrictions Precautions Precautions: Fall Restrictions Weight Bearing Restrictions: No   Pertinent Vitals/Pain HR in 120's during session. Educated on pursed lip breathing. Pain 3/10 in RLE.     ADL  Upper Body Bathing: Performed;Set up Where Assessed - Upper Body Bathing: Supported sitting Toilet Transfer: Performed;Minimal assistance Toilet Transfer Method: Sit to Loss adjuster, chartered: Raised toilet seat with arms (or 3-in-1 over toilet) Toileting - Clothing Manipulation and Hygiene: Performed;Minimal assistance Where Assessed - Best boy and Hygiene: Standing Equipment Used: Gait belt;Rolling walker Transfers/Ambulation Related to ADLs: Min A for balance ADL Comments: Pt. ambulated to 3 in 1 with Min A for balance. Pt. performed toilet transfer with Min A  for sit to stand. Pt. proceeded to bathe while sitting supported. Pt. bathed UB with Setup A.    OT Diagnosis: Acute pain  OT Problem List: Decreased activity tolerance;Impaired balance (sitting and/or standing);Decreased knowledge of use of DME or AE;Pain OT Treatment Interventions: Self-care/ADL training;DME and/or AE instruction;Therapeutic activities;Patient/family  education;Balance training   OT Goals Acute Rehab OT Goals OT Goal Formulation: With patient Time For Goal Achievement: 04/20/12 Potential to Achieve Goals: Good ADL Goals Pt Will Perform Grooming: Standing at sink;with modified independence ADL Goal: Grooming - Progress: Goal set today Pt Will Perform Upper Body Bathing: with modified independence;Sitting at sink ADL Goal: Upper Body Bathing - Progress: Goal set today Pt Will Perform Lower Body Bathing: Sit to stand from chair;with modified independence ADL Goal: Lower Body Bathing - Progress: Goal set today Pt Will Perform Upper Body Dressing: with modified independence;Sitting, chair;Sitting, bed ADL Goal: Upper Body Dressing - Progress: Goal set today Pt Will Perform Lower Body Dressing: with modified independence;Sit to stand from bed;Sit to stand from chair ADL Goal: Lower Body Dressing - Progress: Goal set today Pt Will Transfer to Toilet: with modified independence;Ambulation;Regular height toilet ADL Goal: Toilet Transfer - Progress: Goal set today Pt Will Perform Toileting - Clothing Manipulation: with modified independence;Standing ADL Goal: Toileting - Clothing Manipulation - Progress: Goal set today Pt Will Perform Toileting - Hygiene: with modified independence;Sit to stand from 3-in-1/toilet ADL Goal: Toileting - Hygiene - Progress: Goal set today Pt Will Perform Tub/Shower Transfer: Tub transfer;with modified independence;Ambulation ADL Goal: Tub/Shower Transfer - Progress: Goal set today  Visit Information  Last OT Received On: 04/06/12 Assistance Needed: +1    Subjective Data  Subjective: Pt. pleasant in session and indicated that she wanted to take a bath.   Prior Functioning  Vision/Perception  Home Living Lives With: Alone Available Help at Discharge: Friend(s);Available PRN/intermittently Type of Home: Apartment Home Access: Ramped entrance Entrance Stairs-Number of Steps: 3 steps to mailbox but level  surface available as well Entrance Stairs-Rails: Left Home Layout: One level Bathroom Shower/Tub: Tub/shower  unit;Curtain Bathroom Toilet: Programmer, systems: Yes How Accessible: Accessible via walker Home Adaptive Equipment: Walker - rolling Prior Function Level of Independence: Needs assistance;Independent with assistive device(s) Needs Assistance: Light Housekeeping Light Housekeeping: Total Able to Take Stairs?: Reciprically Driving: No Vocation: On disability Communication Communication: No difficulties Dominant Hand: Left      Cognition  Overall Cognitive Status: Appears within functional limits for tasks assessed/performed Arousal/Alertness: Awake/alert Orientation Level: Appears intact for tasks assessed Behavior During Session: [facility] for tasks performed    Extremity/Trunk Assessment Right Upper Extremity Assessment RUE ROM/Strength/Tone: Within functional levels Left Upper Extremity Assessment LUE ROM/Strength/Tone: Within functional levels   Mobility Bed Mobility Bed Mobility: Not assessed Transfers Transfers: Sit to Stand;Stand to Sit Sit to Stand: From chair/3-in-1;With upper extremity assist;4: Min assist Stand to Sit: To chair/3-in-1;4: Min assist;With upper extremity assist Details for Transfer Assistance: vc's for hand placement and slow decent when sitting.            End of Session OT - End of Session Activity Tolerance: Patient tolerated treatment well Patient left: in chair;with call bell/phone within reach  GO     Roseanne Reno 04/06/2012, 12:20 PM

## 2012-04-06 NOTE — Progress Notes (Signed)
I agree with the following treatment note after reviewing documentation.   Johnston, Cyntha Brickman Brynn   OTR/L Pager: [phone] Office: [phone] .   

## 2012-04-07 LAB — GLUCOSE, CAPILLARY: Glucose-Capillary: 119 mg/dL — ABNORMAL HIGH (ref 70–99)

## 2012-04-07 MED ORDER — RIVAROXABAN 20 MG PO TABS
20.0000 mg | ORAL_TABLET | Freq: Every day | ORAL | Status: DC
  Administered 2012-04-07 – 2012-04-08 (×2): 20 mg via ORAL
  Filled 2012-04-07 (×2): qty 1

## 2012-04-07 MED ORDER — CEFAZOLIN SODIUM 1-5 GM-% IV SOLN
1.0000 g | Freq: Three times a day (TID) | INTRAVENOUS | Status: DC
  Administered 2012-04-07 – 2012-04-08 (×3): 1 g via INTRAVENOUS
  Filled 2012-04-07 (×5): qty 50

## 2012-04-07 NOTE — Progress Notes (Signed)
Patient ID: Jenna Brown, female   DOB: 08-Nov-1950, 61 y.o.   MRN: [id number] Vascular Surgery Progress Note  Subjective: 2 days post thrombectomy femoral posterior tibial bypass Patient states right foot continues to feel better Continues on angio max drip Ambulating with walker with help  Objective:  Filed Vitals:   04/07/12 0842  BP: 112/73  Pulse: 112  Temp: 99.6 F (37.6 C)  Resp: 18    General alert and oriented x3 Lungs no rhonchi or wheezing Right leg well perfused with no pain or numbness-PT Doppler flow   Labs:  Lab 04/06/12 0152 04/04/12 1636  CREATININE 0.85 0.94    Lab 04/06/12 0152 04/04/12 1636  NA 139 138  K 3.8 3.4*  CL 97 97  CO2 28 30  BUN 12 15  CREATININE 0.85 0.94  LABGLOM -- --  GLUCOSE 194* --  CALCIUM 8.8 8.7    Lab 04/06/12 0152 04/04/12 1636 04/01/12 0545  WBC 18.7* 13.2* 13.6*  HGB 10.6* 8.8* 9.0*  HCT 31.7* 26.8* 28.0*  PLT 414* 399 272    Lab 04/01/12 0545  INR 1.71*    I/O last 3 completed shifts: In: 1130 [P.O.:480; I.V.:650] Out: 2290 [Urine:2290]  Imaging: No results found.  Assessment/Plan:  POD #2  LOS: 9 days  s/p Procedure(s): BYPASS GRAFT FEMORAL-POPLITEAL ARTERY-thrombectomy  Plan-increase ambulation today #2 DC Angiomax drip #3 begin Xeralta #4 DC home in a.m.   Tinnie Gens, MD 04/07/2012 9:56 AM

## 2012-04-07 NOTE — Progress Notes (Addendum)
MD paged concerning pt's Right groin incision and fevers. Upon getting patient up to bedside potty, noticed pt's Groin bandage was dangling and saturated. Dressing removed, Right groin site red with active drainage of small amount of pus. Orders given and entered. Site painted with betadine and covered with dry 4x 4. Will continue to monitor.

## 2012-04-08 LAB — GLUCOSE, CAPILLARY: Glucose-Capillary: 119 mg/dL — ABNORMAL HIGH (ref 70–99)

## 2012-04-08 LAB — BASIC METABOLIC PANEL
BUN: 8 mg/dL (ref 6–23)
CO2: 31 mEq/L (ref 19–32)
Chloride: 98 mEq/L (ref 96–112)
Creatinine, Ser: 0.92 mg/dL (ref 0.50–1.10)
Glucose, Bld: 58 mg/dL — ABNORMAL LOW (ref 70–99)

## 2012-04-08 LAB — POCT I-STAT 4, (NA,K, GLUC, HGB,HCT)
Glucose, Bld: 99 mg/dL (ref 70–99)
HCT: 25 % — ABNORMAL LOW (ref 36.0–46.0)
Hemoglobin: 8.5 g/dL — ABNORMAL LOW (ref 12.0–15.0)
Potassium: 3.6 mEq/L (ref 3.5–5.1)
Sodium: 140 mEq/L (ref 135–145)

## 2012-04-08 LAB — CBC WITH DIFFERENTIAL/PLATELET
HCT: 30.5 % — ABNORMAL LOW (ref 36.0–46.0)
Hemoglobin: 9.9 g/dL — ABNORMAL LOW (ref 12.0–15.0)
Lymphocytes Relative: 19 % (ref 12–46)
MCHC: 32.5 g/dL (ref 30.0–36.0)
Monocytes Absolute: 1.4 10*3/uL — ABNORMAL HIGH (ref 0.1–1.0)
Monocytes Relative: 9 % (ref 3–12)
Neutro Abs: 10.8 10*3/uL — ABNORMAL HIGH (ref 1.7–7.7)
WBC: 15.2 10*3/uL — ABNORMAL HIGH (ref 4.0–10.5)

## 2012-04-08 MED ORDER — CEFAZOLIN SODIUM 500 MG IJ SOLR: 500.0000 mg | Freq: Three times a day (TID) | INTRAMUSCULAR | Status: DC

## 2012-04-08 MED ORDER — RIVAROXABAN 20 MG PO TABS: 20.0000 mg | ORAL_TABLET | Freq: Every day | ORAL | Status: DC

## 2012-04-08 MED ORDER — OXYCODONE HCL 5 MG PO TABS: 5.0000 mg | ORAL_TABLET | ORAL | Status: AC | PRN

## 2012-04-08 MED ORDER — CEPHALEXIN 500 MG PO CAPS
500.0000 mg | ORAL_CAPSULE | Freq: Three times a day (TID) | ORAL | Status: DC
  Administered 2012-04-08: 500 mg via ORAL
  Filled 2012-04-08 (×3): qty 1

## 2012-04-08 MED ORDER — CEPHALEXIN 500 MG PO CAPS: 500.0000 mg | ORAL_CAPSULE | Freq: Three times a day (TID) | ORAL | Status: AC

## 2012-04-08 NOTE — Discharge Summary (Addendum)
Vascular and Vein Specialists Discharge Summary   Patient ID:  Jenna Brown MRN: [id number] DOB/AGE: 59-Mar-1952 59 y.o.  Admit date: 03/29/2012 Discharge date: 04/08/2012 Date of Surgery: 03/29/2012 - 04/05/2012 Surgeon: Juliann Mule): [name], MD  Admission Diagnosis: Peripheral Arterial Disease PVD  Discharge Diagnoses:  Peripheral Arterial Disease PVD  Secondary Diagnoses: Past Medical History  Diagnosis Date  . Hyperlipidemia     takes Simvastatin daily  . Peripheral vascular disease   . Stroke     TIA history  . PONV (postoperative nausea and vomiting)   . Hypertension     takes Amlodipine/HCTZ/Losartan daily  . Myocardial infarction 1990's  . Headache     when b/p is elevated  . Vertigo     takes Ativert prn  . Hemorrhoids   . Diabetes mellitus     takes Glucovance and Januvia daily  . Anxiety     takes Xanax prn    Procedure(s): BYPASS GRAFT FEMORAL-POPLITEAL ARTERY  Discharged Condition: stable  HPI: The patient comes back today for followup. She recently underwent angiogram on June 12 which reveals an occluded popliteal artery on the right. There is reconstitution of the posterior tibial artery. She continues to have an ulcer on her right second toe. She quit smoking on June 10. Occluded right leg bypass graft return to OR 04-05-2012.   1.) thrombectomy and revision right femoral posterior tibial bypass graft  2.) Redo right common femoral and posterior tibial artery exposure  3.) Patch angioplasty right posterior tibial artery bypass graft with bovine patch    Hospital Course:  Jenna Brown is a 61 y.o. female is S/P Right Procedure(s): BYPASS GRAFT FEMORAL-POPLITEAL ARTERY Extubated: POD # 0 Post-op wounds healing well Pt. Ambulating, voiding and taking PO diet without difficulty. Pt pain controlled with PO pain meds. Labs as below Complications:return for thrombectomy 04-05-2012.  Consults:     Significant Diagnostic  Studies: CBC Lab Results  Component Value Date   WBC 15.2* 04/08/2012   HGB 9.9* 04/08/2012   HCT 30.5* 04/08/2012   MCV 81.8 04/08/2012   PLT 430* 04/08/2012    BMET    Component Value Date/Time   NA 140 04/08/2012 0540   K 3.4* 04/08/2012 0540   CL 98 04/08/2012 0540   CO2 31 04/08/2012 0540   GLUCOSE 58* 04/08/2012 0540   BUN 8 04/08/2012 0540   CREATININE 0.92 04/08/2012 0540   CALCIUM 8.8 04/08/2012 0540   GFRNONAA 67* 04/08/2012 0540   GFRAA 78* 04/08/2012 0540   COAG Lab Results  Component Value Date   INR 1.71* 04/01/2012   INR 1.09 03/31/2012   INR 1.08 03/30/2012     Disposition:  Discharge to :Home Discharge Orders    Future Orders Please Complete By Expires   Resume previous diet      Driving Restrictions      Comments:   No driving for 2 weeks   Lifting restrictions      Comments:   No lifting for 6 weeks   Call MD for:  temperature >100.5      Call MD for:  redness, tenderness, or signs of infection (pain, swelling, bleeding, redness, odor or green/yellow discharge around incision site)      Call MD for:  severe or increased pain, loss or decreased feeling  in affected limb(s)      Increase activity slowly      Comments:   Walk with assistance use walker or cane as needed   Discharge  wound care:      Comments:   BID dry dressing to right groin area.      Landa, Vastola  Home Medication Instructions [id number]   Printed on:04/08/12 0858  Medication Information                    glyBURIDE-metformin (GLUCOVANCE) 5-500 MG per tablet Take 2 tablets by mouth 2 (two) times daily with a meal.            ALPRAZolam (XANAX) 0.25 MG tablet Take 0.25 mg by mouth daily as needed. For anxiety           meclizine (ANTIVERT) 25 MG tablet Take 25 mg by mouth daily as needed. For vertigo           ibuprofen (ADVIL,MOTRIN) 200 MG tablet Take 400 mg by mouth every 8 (eight) hours as needed. For pain           losartan (COZAAR) 100 MG tablet Take 100 mg by mouth  every morning.            hydrochlorothiazide (HYDRODIURIL) 25 MG tablet Take 25 mg by mouth every morning.            amLODipine (NORVASC) 10 MG tablet Take 10 mg by mouth every morning.            sitaGLIPtin (JANUVIA) 100 MG tablet Take 100 mg by mouth every morning.           aspirin 81 MG chewable tablet Chew 81 mg by mouth every other day.           simvastatin (ZOCOR) 40 MG tablet Take 40 mg by mouth at bedtime.           cephALEXin (KEFLEX) 500 MG capsule Take 1 capsule (500 mg total) by mouth every 8 (eight) hours.           oxyCODONE (OXY IR/ROXICODONE) 5 MG immediate release tablet Take 1-2 tablets (5-10 mg total) by mouth every 4 (four) hours as needed.           Rivaroxaban (XARELTO) 20 MG TABS Take 1 tablet (20 mg total) by mouth daily.            Verbal and written Discharge instructions given to the patient. Wound care per Discharge AVS Follow-up Information    Follow up with [place] . (Home Health Physical Therapy and Occupational Therapy)    Contact information:   [phone]      Follow up with [name] IV, [place], MD in 1 week.   Contact information:   [address] [place] [phone]          Signed: [name] [facility] 04/08/2012, 8:58 AM    Agree with above

## 2012-04-08 NOTE — Progress Notes (Signed)
   CARE MANAGEMENT NOTE 04/08/2012  Patient:  SERENE, BASTOW   Account Number:  [id number]  Date Initiated:  04/04/2012  Documentation initiated by:  SIMMONS,CRYSTAL  Subjective/Objective Assessment:   ADMITTED WITH PAD; LIVES AT HOME ALONE; [place].     Action/Plan:   DISCHARGE  PLANNING.   Anticipated DC Date:  04/07/2012   Anticipated DC Plan:  [place] [facility]  CM consult      PAC Choice  [place]   Choice offered to / List presented to:  C-1 Patient   DME arranged  [facility]OOL  3-N-1      DME agency  [place] arranged  HH-1 RN  HH-10 DISEASE MANAGEMENT  HH-2 PT  HH-3 OT  [place]      [place].   Status of service:  Completed, signed off Medicare Important Message given?   (If response is "NO", the following Medicare IM given date fields will be blank) Date Medicare IM given:   Date Additional Medicare IM given:    Discharge Disposition:  [place]  Per UR Regulation:  Reviewed for med. necessity/level of care/duration of stay  If discussed at [place] of Stay Meetings, dates discussed:    Comments:  04/08/2012 1000 Faxed orders and facesheet for 3n1 and HH PT/OT, aide to [facility]. [facility] will deliver 3n1 to room before d/c.  Jonnie Finner RN CCM Case Mgmt phone [phone]  04/05/12  [place], BSN [phone] REFERRAL PLACED TO [facility] WITH [facility] FOR HHRN PER PT CHOICE; SOC DATE: WITHIN 24-48HRS POST DISCHARGE; PT STATED SHE HAS "ALOT OF FAMILY" THAT WILL BE HELPING WITH HER CARE POST D/C; MD-  PLEASE ORDER SHOWER CHAIR BEFORE D/C.   04/04/12  1353  CRYSTAL SIMMONS RN, BSN [phone] SCHEDULED FOR RIGHT THROMBECTOMY ON 04/05/12;   NCM WILL FOLLOW.

## 2012-04-08 NOTE — Discharge Summary (Signed)
Agree with above 

## 2012-04-08 NOTE — Progress Notes (Addendum)
VASCULAR & VEIN SPECIALISTS OF [place]  Progress Note Bypass Surgery  Date of Surgery: 03/29/2012 - 04/05/2012  Procedure(s): BYPASS GRAFT FEMORAL-POPLITEAL ARTERY Surgeon: Surgeon(s): [name], MD  3 Days Post-Op  History of Present Illness  Jenna Brown is a 61 y.o. female who is S/P Procedure(s): BYPASS GRAFT FEMORAL-POPLITEAL ARTERY right.  The patient's pre-op symptoms of pain are Improved . Patients pain is well controlled.       Imaging: No results found.  Significant Diagnostic Studies: CBC Lab Results  Component Value Date   WBC 15.2* 04/08/2012   HGB 9.9* 04/08/2012   HCT 30.5* 04/08/2012   MCV 81.8 04/08/2012   PLT 430* 04/08/2012    BMET     Component Value Date/Time   NA 140 04/08/2012 0540   K 3.4* 04/08/2012 0540   CL 98 04/08/2012 0540   CO2 31 04/08/2012 0540   GLUCOSE 58* 04/08/2012 0540   BUN 8 04/08/2012 0540   CREATININE 0.92 04/08/2012 0540   CALCIUM 8.8 04/08/2012 0540   GFRNONAA 67* 04/08/2012 0540   GFRAA 78* 04/08/2012 0540    COAG Lab Results  Component Value Date   INR 1.71* 04/01/2012   INR 1.09 03/31/2012   INR 1.08 03/30/2012   No results found for this basename: PTT    Physical Examination  BP Readings from Last 3 Encounters:  04/08/12 123/68  04/08/12 123/68  04/08/12 123/68   Temp Readings from Last 3 Encounters:  04/08/12 99.3 F (37.4 C) Oral  04/08/12 99.3 F (37.4 C) Oral  04/08/12 99.3 F (37.4 C) Oral   SpO2 Readings from Last 3 Encounters:  04/08/12 93%  04/08/12 93%  04/08/12 93%   Pulse Readings from Last 3 Encounters:  04/08/12 100  04/08/12 100  04/08/12 100    Pt is A&O x 3 right lower extremity: Incision/s is/are clean,dry.intact, and  healing without hematoma, erythema.  There is min. SS drainage at the groin incision. Limb is warm; with good color Doppler PT, toes are cool to touch.  Sensation distally decreased.  Assessment/Plan: Pt. Doing well Post-op pain is controlled Wounds are  healing well PT/OT for ambulation Continue wound care as ordered Dry dressing BID to right groin area.  [name] [facility] [id number] 04/08/2012 8:41 AM        Agree with above Plan DC home today

## 2012-04-08 NOTE — Progress Notes (Signed)
Pt discharged, daughter present at bedside. Discharge instructions reviewed, all questions and Rx'es given. Home health arranged for pt and bedside potty sent home with patient and daughter.

## 2012-04-09 ENCOUNTER — Telehealth: Payer: Self-pay

## 2012-04-09 LAB — TYPE AND SCREEN
Antibody Screen: NEGATIVE
Unit division: 0
Unit division: 0
Unit division: 0

## 2012-04-09 NOTE — Telephone Encounter (Signed)
Call rec'd from [facility] RN for report of increased sero-sang drainage from right groin incision.  States drainage has "pus" contained in it, and has an odor. States there is a lump in the incision, and states "I'm not sure if this is part of the graft or not."  Denies any pulsation within the raised area.   Reports that pt. has redness at lower end of right leg incision.  Also reports that the right foot feels cool.  Asking about consideration for change in the wound care from dry dsg. BID to Maxsorb to wound every other day, to help absorb the drainage.  Stated pt. Doesn't have help to assist her with the BID dressing changes.  Discussed the above w/ Dr. [name].  Rec'd v.o. To change wound care to the Maxsorb qod.  Also, recommends to have pt. brought in for wound check on Wed., 04/11/12.   States no vascular study needed at present/ no revascularization options at this time.  Will schedule appt. For wound check.

## 2012-04-10 ENCOUNTER — Ambulatory Visit (INDEPENDENT_AMBULATORY_CARE_PROVIDER_SITE_OTHER): Payer: Medicaid Other | Admitting: Neurosurgery

## 2012-04-10 ENCOUNTER — Encounter: Payer: Self-pay | Admitting: Neurosurgery

## 2012-04-10 VITALS — BP 100/65 | HR 96 | Resp 16 | Ht 64.0 in | Wt 164.0 lb

## 2012-04-10 DIAGNOSIS — I70219 Atherosclerosis of native arteries of extremities with intermittent claudication, unspecified extremity: Secondary | ICD-10-CM

## 2012-04-10 NOTE — Progress Notes (Signed)
Subjective:     Patient ID: Jenna Brown, female   DOB: 01/06/1951, 61 y.o.   MRN: [id number]  HPI: 61 year old female patient of Dr. [name] who underwent :1.) thrombectomy and revision right femoral posterior tibial bypass graft  2.) Redo right common femoral and posterior tibial artery exposure  3.) Patch angioplasty right posterior tibial artery bypass graft with bovine patch  Due to an occluded right leg bypass graft. The patient to procedures between 03/29/2012 04/05/2012. [facility] initiated dressing changes yesterday and the nurse called stating the patient probably should be evaluated for possible infection in the right groin wound. The patient was brought in for evaluation. The patient's daughter who accompanies her today states the patient is having quite a bit of right lower extremity pain which has been consistent since the last procedure.  Review of Systems: 12 point review of systems is notable for the difficulties described above otherwise unremarkable     Objective:   Physical Exam: Vital signs are stable, the patient is afebrile, Dr. Donnetta Hutching examined the patient with me. He does not think the right groin wound is infected although it is draining, this is probably due to the location of the wound in the skin folds. Right lower extremity incision appears to be healing well all staples are intact.     Assessment:     Patient is now less than one-week status post the above procedure. Right groin wound to be kept dry and clean to promote healing.    Plan:     Home health care to continue. Per Dr. Donnetta Hutching, the patient should see Dr. [name] on Monday, July 29. The patient and the daughter are in agreement with this. The patient's daughter is also going to pursue having the patient moved to a skilled nursing facility for rehabilitation as she and her family were under the impression the patient was to be discharged to a facility instead of home. Arbie Cookey, our triage nurse, will  give her the [facility] hospital case managers number.  Beatris Ship ANP  Clinic M.D.: [place]

## 2012-04-13 ENCOUNTER — Encounter: Payer: Self-pay | Admitting: Surgery

## 2012-04-16 ENCOUNTER — Encounter: Payer: Self-pay | Admitting: Surgery

## 2012-04-16 ENCOUNTER — Ambulatory Visit (INDEPENDENT_AMBULATORY_CARE_PROVIDER_SITE_OTHER): Payer: Medicaid Other | Admitting: Surgery

## 2012-04-16 VITALS — BP 131/65 | HR 119 | Temp 98.6°F | Ht 64.0 in | Wt 151.0 lb

## 2012-04-16 DIAGNOSIS — I70219 Atherosclerosis of native arteries of extremities with intermittent claudication, unspecified extremity: Secondary | ICD-10-CM

## 2012-04-16 NOTE — Progress Notes (Signed)
The patient is back today for followup. She underwent right femoral to posterior tibial artery bypass graft with a Taylor patch in mid July of 2013. This occluded early. She was taken back for revision. I placed a bovine pericardial patch and removed the vein patch at that time. She was maintained on anticoagulations with Xaralto.   At her second operation I closed her incisions with staples. There was some denuded tissue in the right groin. She was sent home on antibiotics. She was seen last week with concerns over her groin wound. She's come back to see me today.  Is a fair amount of drainage from the right groin. I elected to remove several staples and open the wound. There is no graft exposed. I packed the wound with Kerlix. I could not obtain a posterior tibial signal today. I suspect her bypass graft is occluded. The wound on her right second toe is at the tip and appears to be a dry eschar only affecting the tip.  The patient will come back in one week to see our nurse practitioner for a wound check. I will, and evaluate her at that time to make sure that her right groin continues to heal. I discussed today with the patient and daughter that I am worried about the right toe wound. Hopefully this will heal on its own but certainly she is at risk for amputation. I did not see any other options for revascularization. I suspect her bypass graft went down because of poor outflow.

## 2012-04-17 ENCOUNTER — Telehealth: Payer: Self-pay | Admitting: *Deleted

## 2012-04-17 NOTE — Telephone Encounter (Signed)
Jenna Brown called and left a message to check on when [facility] nurse would be coming to her house. I called Betsy and she said orders were completed this morning and a nurse should be seeing Jenna Brown this afternoon. I called Jenna. Zoucha back and talked to her sister who said she would tell Jenna. Kerbo that the nurse would be out.

## 2012-04-20 ENCOUNTER — Encounter: Payer: Self-pay | Admitting: Neurosurgery

## 2012-04-23 ENCOUNTER — Ambulatory Visit (INDEPENDENT_AMBULATORY_CARE_PROVIDER_SITE_OTHER): Payer: Medicaid Other | Admitting: Neurosurgery

## 2012-04-23 ENCOUNTER — Encounter: Payer: Self-pay | Admitting: Neurosurgery

## 2012-04-23 VITALS — BP 139/81 | HR 114 | Resp 18 | Ht 64.0 in | Wt 151.0 lb

## 2012-04-23 DIAGNOSIS — I70219 Atherosclerosis of native arteries of extremities with intermittent claudication, unspecified extremity: Secondary | ICD-10-CM

## 2012-04-23 MED ORDER — HYDROCODONE-ACETAMINOPHEN 5-500 MG PO TABS: 1.0000 | ORAL_TABLET | Freq: Four times a day (QID) | ORAL | Status: DC | PRN

## 2012-04-23 NOTE — Progress Notes (Signed)
Subjective:     Patient ID: Jenna Brown, female   DOB: 11/19/1950, 61 y.o.   MRN: [id number]  HPI: 61 year old female patient of Dr. [name] who underwent a right femoral to posterior tibial artery bypass in mid July 2013 which subsequently occluded early. She was taken back for revision and comes in today for wound check per Dr. [name]. Patient states her staples her lower extremity are bothering her and she does still have some surgical pain otherwise doing well.   Review of Systems: 12 point review of systems is notable for the difficulties described above otherwise unremarkable     Objective:   Physical Exam: Afebrile, vital signs are stable, right lower extremity wound is well healed we will remove the staples, groin wound healing by second intention with packing in place.     Assessment:     Right groin wound healing, right lower extremity wound healed right second toe still has a dry scabbed wound just at the tip    Plan:     Dr. [name] examined the patient, she will return in 2 weeks for a right groin wound checked and have staples removed. We will remove staples and right lower extremity today. Refill hydrocodone when necessary  Beatris Ship ANP  Clinic M.D.: [name]

## 2012-04-24 ENCOUNTER — Encounter: Payer: Self-pay | Admitting: Vascular Surgery

## 2012-04-24 ENCOUNTER — Ambulatory Visit (INDEPENDENT_AMBULATORY_CARE_PROVIDER_SITE_OTHER): Payer: Medicaid Other | Admitting: Vascular Surgery

## 2012-04-24 VITALS — BP 117/62 | HR 116 | Temp 89.9°F | Resp 20 | Ht 64.0 in | Wt 150.0 lb

## 2012-04-24 DIAGNOSIS — I70219 Atherosclerosis of native arteries of extremities with intermittent claudication, unspecified extremity: Secondary | ICD-10-CM

## 2012-04-24 NOTE — Progress Notes (Signed)
The patient is status post right and posterior tibial bypass in July with Nieko Clarin occlusion and revision with known graft occlusion. She had staples removed from the popliteal incision yesterday and pelvis morning with the drainage from this. She was asked to present to the office for evaluation. She has old hematoma from this site from the lower pole of her popliteal incision. There is no active bleeding. This was expressed. She did have Steri-Strips were removed. This was packed and her caregiver was instructed on daily dressing changes. She is comfortable with this and she is artery doing packing to her right groin incision. She will followup with Dr. [name] in 2 weeks as planned

## 2012-04-27 ENCOUNTER — Telehealth: Payer: Self-pay | Admitting: *Deleted

## 2012-04-27 NOTE — Telephone Encounter (Signed)
Ivin Booty called concerned with sister stating that she feels she should not be living alone or at least social worker be involved with her care. Upon discharge from hospital  [facility] did not convey that she needed assistance of any kind. I called [facility] to order a nurse assessment as they are seeing her for wound care at this time.

## 2012-04-30 ENCOUNTER — Telehealth: Payer: Self-pay | Admitting: *Deleted

## 2012-04-30 DIAGNOSIS — L98499 Non-pressure chronic ulcer of skin of other sites with unspecified severity: Secondary | ICD-10-CM

## 2012-04-30 DIAGNOSIS — I739 Peripheral vascular disease, unspecified: Secondary | ICD-10-CM

## 2012-04-30 MED ORDER — HYDROCODONE-ACETAMINOPHEN 5-500 MG PO TABS: 1.0000 | ORAL_TABLET | Freq: Four times a day (QID) | ORAL | Status: DC | PRN

## 2012-04-30 NOTE — Telephone Encounter (Signed)
Called in refill of hydrocodone per standing order and from Marty Heck, NP dictation of 04-24-12.    Called to [facility].  Patient was informed of this and voiced understanding of keeping her appt. On Monday with Dr. [name].

## 2012-05-02 ENCOUNTER — Encounter (HOSPITAL_COMMUNITY): Payer: Self-pay | Admitting: General Practice

## 2012-05-02 ENCOUNTER — Telehealth: Payer: Self-pay | Admitting: *Deleted

## 2012-05-02 ENCOUNTER — Other Ambulatory Visit: Payer: Self-pay | Admitting: Physician Assistant

## 2012-05-02 ENCOUNTER — Ambulatory Visit (INDEPENDENT_AMBULATORY_CARE_PROVIDER_SITE_OTHER): Payer: Medicaid Other | Admitting: Neurosurgery

## 2012-05-02 ENCOUNTER — Inpatient Hospital Stay (HOSPITAL_COMMUNITY)
Admission: AD | Admit: 2012-05-02 | Discharge: 2012-05-04 | DRG: 921 | Disposition: A | Discharge status: Skilled Nursing Facility | Payer: Medicaid Other | Source: Ambulatory Visit | Attending: Vascular Surgery | Admitting: Vascular Surgery

## 2012-05-02 ENCOUNTER — Encounter: Payer: Self-pay | Admitting: Neurosurgery

## 2012-05-02 VITALS — BP 121/78 | HR 124 | Resp 22 | Ht 64.0 in | Wt 150.0 lb

## 2012-05-02 DIAGNOSIS — Y832 Surgical operation with anastomosis, bypass or graft as the cause of abnormal reaction of the patient, or of later complication, without mention of misadventure at the time of the procedure: Secondary | ICD-10-CM | POA: Diagnosis present

## 2012-05-02 DIAGNOSIS — Z79899 Other long term (current) drug therapy: Secondary | ICD-10-CM

## 2012-05-02 DIAGNOSIS — T81329A Deep disruption or dehiscence of operation wound, unspecified, initial encounter: Principal | ICD-10-CM | POA: Diagnosis present

## 2012-05-02 DIAGNOSIS — Z7982 Long term (current) use of aspirin: Secondary | ICD-10-CM

## 2012-05-02 DIAGNOSIS — L98499 Non-pressure chronic ulcer of skin of other sites with unspecified severity: Secondary | ICD-10-CM | POA: Diagnosis present

## 2012-05-02 DIAGNOSIS — I739 Peripheral vascular disease, unspecified: Secondary | ICD-10-CM | POA: Diagnosis present

## 2012-05-02 DIAGNOSIS — T8130XA Disruption of wound, unspecified, initial encounter: Secondary | ICD-10-CM | POA: Insufficient documentation

## 2012-05-02 DIAGNOSIS — T8132XA Disruption of internal operation (surgical) wound, not elsewhere classified, initial encounter: Principal | ICD-10-CM | POA: Diagnosis present

## 2012-05-02 DIAGNOSIS — Z8673 Personal history of transient ischemic attack (TIA), and cerebral infarction without residual deficits: Secondary | ICD-10-CM

## 2012-05-02 DIAGNOSIS — I252 Old myocardial infarction: Secondary | ICD-10-CM

## 2012-05-02 DIAGNOSIS — I70219 Atherosclerosis of native arteries of extremities with intermittent claudication, unspecified extremity: Secondary | ICD-10-CM

## 2012-05-02 DIAGNOSIS — K219 Gastro-esophageal reflux disease without esophagitis: Secondary | ICD-10-CM | POA: Diagnosis present

## 2012-05-02 DIAGNOSIS — E785 Hyperlipidemia, unspecified: Secondary | ICD-10-CM | POA: Diagnosis present

## 2012-05-02 DIAGNOSIS — I7092 Chronic total occlusion of artery of the extremities: Secondary | ICD-10-CM

## 2012-05-02 DIAGNOSIS — E119 Type 2 diabetes mellitus without complications: Secondary | ICD-10-CM | POA: Diagnosis present

## 2012-05-02 DIAGNOSIS — M129 Arthropathy, unspecified: Secondary | ICD-10-CM | POA: Diagnosis present

## 2012-05-02 DIAGNOSIS — T8131XA Disruption of external operation (surgical) wound, not elsewhere classified, initial encounter: Secondary | ICD-10-CM

## 2012-05-02 DIAGNOSIS — I10 Essential (primary) hypertension: Secondary | ICD-10-CM | POA: Diagnosis present

## 2012-05-02 DIAGNOSIS — F411 Generalized anxiety disorder: Secondary | ICD-10-CM | POA: Diagnosis present

## 2012-05-02 HISTORY — DX: Type 2 diabetes mellitus without complications: E11.9

## 2012-05-02 HISTORY — DX: Migraine, unspecified, not intractable, without status migrainosus: G43.909

## 2012-05-02 HISTORY — DX: Unspecified osteoarthritis, unspecified site: M19.90

## 2012-05-02 HISTORY — DX: Personal history of other diseases of the digestive system: Z87.19

## 2012-05-02 HISTORY — DX: Gastro-esophageal reflux disease without esophagitis: K21.9

## 2012-05-02 LAB — COMPREHENSIVE METABOLIC PANEL
ALT: 8 U/L (ref 0–35)
Alkaline Phosphatase: 123 U/L — ABNORMAL HIGH (ref 39–117)
CO2: 24 mEq/L (ref 19–32)
Calcium: 9.9 mg/dL (ref 8.4–10.5)
Chloride: 95 mEq/L — ABNORMAL LOW (ref 96–112)
GFR calc Af Amer: 25 mL/min — ABNORMAL LOW (ref >90)
GFR calc non Af Amer: 22 mL/min — ABNORMAL LOW (ref >90)
Glucose, Bld: 189 mg/dL — ABNORMAL HIGH (ref 70–99)
Sodium: 135 mEq/L (ref 135–145)
Total Bilirubin: 0.5 mg/dL (ref 0.3–1.2)

## 2012-05-02 LAB — CBC
Hemoglobin: 10.5 g/dL — ABNORMAL LOW (ref 12.0–15.0)
MCH: 25.5 pg — ABNORMAL LOW (ref 26.0–34.0)
MCV: 79.6 fL (ref 78.0–100.0)
RBC: 4.12 MIL/uL (ref 3.87–5.11)
WBC: 16.7 10*3/uL — ABNORMAL HIGH (ref 4.0–10.5)

## 2012-05-02 LAB — GLUCOSE, CAPILLARY: Glucose-Capillary: 197 mg/dL — ABNORMAL HIGH (ref 70–99)

## 2012-05-02 MED ORDER — LINAGLIPTIN 5 MG PO TABS
5.0000 mg | ORAL_TABLET | Freq: Every day | ORAL | Status: DC
  Administered 2012-05-03 – 2012-05-04 (×2): 5 mg via ORAL
  Filled 2012-05-02 (×2): qty 1

## 2012-05-02 MED ORDER — OXYCODONE HCL 5 MG PO TABS
5.0000 mg | ORAL_TABLET | ORAL | Status: DC | PRN
  Administered 2012-05-02 – 2012-05-04 (×9): 10 mg via ORAL
  Filled 2012-05-02 (×10): qty 2

## 2012-05-02 MED ORDER — ENOXAPARIN SODIUM 40 MG/0.4ML ~~LOC~~ SOLN
40.0000 mg | SUBCUTANEOUS | Status: DC
  Administered 2012-05-02: 40 mg via SUBCUTANEOUS
  Filled 2012-05-02: qty 0.4

## 2012-05-02 MED ORDER — POTASSIUM CHLORIDE CRYS ER 20 MEQ PO TBCR: 20.0000 meq | EXTENDED_RELEASE_TABLET | Freq: Once | ORAL | Status: DC

## 2012-05-02 MED ORDER — METOPROLOL TARTRATE 1 MG/ML IV SOLN
2.0000 mg | INTRAVENOUS | Status: DC | PRN
  Administered 2012-05-03: 2 mg via INTRAVENOUS
  Filled 2012-05-02: qty 5

## 2012-05-02 MED ORDER — PHENOL 1.4 % MT LIQD
1.0000 | OROMUCOSAL | Status: DC | PRN
  Filled 2012-05-02: qty 177

## 2012-05-02 MED ORDER — VANCOMYCIN HCL 1000 MG IV SOLR
750.0000 mg | INTRAVENOUS | Status: DC
  Administered 2012-05-02 – 2012-05-03 (×2): 750 mg via INTRAVENOUS
  Filled 2012-05-02 (×5): qty 750

## 2012-05-02 MED ORDER — SIMVASTATIN 40 MG PO TABS
40.0000 mg | ORAL_TABLET | Freq: Every day | ORAL | Status: DC
  Administered 2012-05-03: 40 mg via ORAL
  Filled 2012-05-02: qty 1

## 2012-05-02 MED ORDER — HYDROCODONE-ACETAMINOPHEN 5-325 MG PO TABS
1.0000 | ORAL_TABLET | ORAL | Status: DC | PRN
  Administered 2012-05-04: 1 via ORAL
  Filled 2012-05-02: qty 1

## 2012-05-02 MED ORDER — GUAIFENESIN-DM 100-10 MG/5ML PO SYRP: 15.0000 mL | ORAL_SOLUTION | ORAL | Status: DC | PRN

## 2012-05-02 MED ORDER — ONDANSETRON HCL 4 MG/2ML IJ SOLN: 4.0000 mg | Freq: Four times a day (QID) | INTRAMUSCULAR | Status: DC | PRN

## 2012-05-02 MED ORDER — PANTOPRAZOLE SODIUM 40 MG PO TBEC
40.0000 mg | DELAYED_RELEASE_TABLET | Freq: Every day | ORAL | Status: DC
  Administered 2012-05-03 – 2012-05-04 (×2): 40 mg via ORAL
  Filled 2012-05-02 (×2): qty 1

## 2012-05-02 MED ORDER — MECLIZINE HCL 25 MG PO TABS
25.0000 mg | ORAL_TABLET | Freq: Every day | ORAL | Status: DC | PRN
  Filled 2012-05-02: qty 1

## 2012-05-02 MED ORDER — GLYBURIDE 5 MG PO TABS
5.0000 mg | ORAL_TABLET | Freq: Two times a day (BID) | ORAL | Status: DC
  Administered 2012-05-03 – 2012-05-04 (×2): 5 mg via ORAL
  Filled 2012-05-02 (×5): qty 1

## 2012-05-02 MED ORDER — METFORMIN HCL 500 MG PO TABS
500.0000 mg | ORAL_TABLET | Freq: Two times a day (BID) | ORAL | Status: DC
  Filled 2012-05-02 (×3): qty 1

## 2012-05-02 MED ORDER — ALUM & MAG HYDROXIDE-SIMETH 200-200-20 MG/5ML PO SUSP: 15.0000 mL | ORAL | Status: DC | PRN

## 2012-05-02 MED ORDER — SENNA 8.6 MG PO TABS
1.0000 | ORAL_TABLET | Freq: Two times a day (BID) | ORAL | Status: DC
  Administered 2012-05-02 – 2012-05-04 (×4): 8.6 mg via ORAL
  Filled 2012-05-02 (×5): qty 1

## 2012-05-02 MED ORDER — RIVAROXABAN 20 MG PO TABS
20.0000 mg | ORAL_TABLET | ORAL | Status: AC
  Administered 2012-05-02: 20 mg via ORAL
  Filled 2012-05-02: qty 1

## 2012-05-02 MED ORDER — HYDRALAZINE HCL 20 MG/ML IJ SOLN
10.0000 mg | INTRAMUSCULAR | Status: DC | PRN
  Filled 2012-05-02: qty 0.5

## 2012-05-02 MED ORDER — RIVAROXABAN 20 MG PO TABS
20.0000 mg | ORAL_TABLET | ORAL | Status: DC
  Administered 2012-05-03: 20 mg via ORAL
  Filled 2012-05-02 (×2): qty 1

## 2012-05-02 MED ORDER — GLYBURIDE-METFORMIN 5-500 MG PO TABS: 1.0000 | ORAL_TABLET | Freq: Two times a day (BID) | ORAL | Status: DC

## 2012-05-02 MED ORDER — LABETALOL HCL 5 MG/ML IV SOLN
10.0000 mg | INTRAVENOUS | Status: DC | PRN
  Filled 2012-05-02: qty 4

## 2012-05-02 MED ORDER — LOSARTAN POTASSIUM 50 MG PO TABS
100.0000 mg | ORAL_TABLET | Freq: Every day | ORAL | Status: DC
  Filled 2012-05-02: qty 2

## 2012-05-02 MED ORDER — MORPHINE SULFATE 2 MG/ML IJ SOLN
2.0000 mg | INTRAMUSCULAR | Status: DC | PRN
  Administered 2012-05-03 – 2012-05-04 (×2): 2 mg via INTRAVENOUS
  Filled 2012-05-02 (×3): qty 1

## 2012-05-02 MED ORDER — VANCOMYCIN HCL 1000 MG IV SOLR
750.0000 mg | Freq: Two times a day (BID) | INTRAVENOUS | Status: DC
  Filled 2012-05-02 (×2): qty 750

## 2012-05-02 MED ORDER — LOSARTAN POTASSIUM 50 MG PO TABS: 100.0000 mg | ORAL_TABLET | Freq: Every morning | ORAL | Status: DC

## 2012-05-02 MED ORDER — PIPERACILLIN-TAZOBACTAM IN DEX 2-0.25 GM/50ML IV SOLN
2.2500 g | Freq: Three times a day (TID) | INTRAVENOUS | Status: DC
  Administered 2012-05-03 – 2012-05-04 (×4): 2.25 g via INTRAVENOUS
  Filled 2012-05-02 (×6): qty 50

## 2012-05-02 MED ORDER — PIPERACILLIN-TAZOBACTAM 3.375 G IVPB
3.3750 g | Freq: Three times a day (TID) | INTRAVENOUS | Status: DC
  Administered 2012-05-02: 3.375 g via INTRAVENOUS
  Filled 2012-05-02 (×2): qty 50

## 2012-05-02 MED ORDER — AMLODIPINE BESYLATE 10 MG PO TABS
10.0000 mg | ORAL_TABLET | Freq: Every morning | ORAL | Status: DC
  Administered 2012-05-03 – 2012-05-04 (×2): 10 mg via ORAL
  Filled 2012-05-02 (×2): qty 1

## 2012-05-02 MED ORDER — IBUPROFEN 400 MG PO TABS
400.0000 mg | ORAL_TABLET | Freq: Three times a day (TID) | ORAL | Status: DC | PRN
  Filled 2012-05-02: qty 1

## 2012-05-02 MED ORDER — INSULIN ASPART 100 UNIT/ML ~~LOC~~ SOLN
0.0000 [IU] | Freq: Three times a day (TID) | SUBCUTANEOUS | Status: DC
  Administered 2012-05-03: 5 [IU] via SUBCUTANEOUS
  Administered 2012-05-03: 3 [IU] via SUBCUTANEOUS
  Administered 2012-05-03: 2 [IU] via SUBCUTANEOUS
  Administered 2012-05-04: 5 [IU] via SUBCUTANEOUS
  Administered 2012-05-04: 3 [IU] via SUBCUTANEOUS

## 2012-05-02 MED ORDER — HYDROCHLOROTHIAZIDE 25 MG PO TABS
25.0000 mg | ORAL_TABLET | Freq: Every morning | ORAL | Status: DC
  Filled 2012-05-02: qty 1

## 2012-05-02 MED ORDER — TEMAZEPAM 15 MG PO CAPS: 15.0000 mg | ORAL_CAPSULE | Freq: Every evening | ORAL | Status: DC | PRN

## 2012-05-02 MED ORDER — ALPRAZOLAM 0.25 MG PO TABS: 0.2500 mg | ORAL_TABLET | Freq: Every day | ORAL | Status: DC | PRN

## 2012-05-02 NOTE — Progress Notes (Deleted)
[facility] [place] HISTORY AND PHYSICAL -PAD CC: Nonhealing right lower extremity surgical incisions Referring Physician: [name] was the surgeon  History of Present Illness  Jenna Brown is a 61 y.o. female patient who presents with chief complaint of right lower extremity pain with incision dressing changes and packing status post right and posterior tibial bypass in July with early occlusion and revision with known graft occlusion. The patient has been seen weekly for various concerns with her incisions including drainage and pain with her dressing changes and we were contacted today to evaluate the patient again. The patient's daughter states that her right groin wound as well as her lower extremity when is "nonhealing" and more than she can handle at home.   Past Medical History  Diagnosis Date  . Hyperlipidemia     takes Simvastatin daily  . Peripheral vascular disease   . Stroke     TIA history  . PONV (postoperative nausea and vomiting)   . Hypertension     takes Amlodipine/HCTZ/Losartan daily  . Myocardial infarction 1990's  . Headache     when b/p is elevated  . Vertigo     takes Ativert prn  . Hemorrhoids   . Diabetes mellitus     takes Glucovance and Januvia daily  . Anxiety     takes Xanax prn    Social History History  Substance Use Topics  . Smoking status: Former Smoker -- 40 years    Types: Cigarettes    Quit date: 02/27/2012  . Smokeless tobacco: Never Used  . Alcohol Use: No    Family History Family History  Problem Relation Age of Onset  . Cancer Mother     breast  . Diabetes Father     ROS: [x]  Positive   [ ]  Denies  General:[ ]  Weight loss,  [ ]  Weight gain, [ ]  Fever, [ ]  chills Neurologic: [ ]  Dizziness, [ ]  Blackouts, [ ]  Seizure [ ]  Stroke, [ ]  "Mini stroke", [ ]  Slurred speech, [ ]  Temporary blindness;  [ ] weakness, [ ]  Hoarseness Cardiac: [ ]  Chest pain/pressure, [ ]  Shortness of breath at rest [ ]  Shortness  of breath with exertion,  [ ]   Atrial fibrillation or irregular heartbeat Vascular:[ x] Pain in legs with walking, [x]  Pain in legs at rest ,[ ]  Pain in legs at night,  [x ]  Non-healing ulcer, [ ]  Blood clot in vein/DVT,   Pulmonary: [ ]  Home oxygen, [ ]   Productive cough, [ ]  Coughing up blood,  [ ]  Asthma,  [ ]  Wheezing Musculoskeletal:  [ ]  Arthritis, [ ]  Low back pain,  [ ]  Joint pain Hematologic:[ ]  Easy Bruising, [ ]  Anemia; [ ]  Hepatitis Gastrointestinal: [ ]  Blood in stool,  [ ]  Gastroesophageal Reflux, [ ]  Trouble swallowing Urinary: [ ]  chronic Kidney disease, [ ]  on HD, [ ]  Burning with urination, [ ]  Frequent urination, [ ]  Difficulty urinating;  Skin: [ ]  Rashes, [ ]  Wounds    Allergies  Allergen Reactions  . Codeine Other (See Comments)    "makes me feel strange"  . Plavix (Clopidogrel Bisulfate) Itching  . Tylenol (Acetaminophen) Other (See Comments)    "Doesn't feel right"   . Tylenol With Codeine #3 (Acetaminophen-Codeine) Other (See Comments)    unknown    Current Outpatient Prescriptions  Medication Sig Dispense Refill  . ALPRAZolam (XANAX) 0.25 MG tablet Take 0.25 mg by mouth daily as needed. For anxiety      .  amLODipine (NORVASC) 10 MG tablet Take 10 mg by mouth every morning.       Marland Kitchen aspirin 81 MG chewable tablet Chew 81 mg by mouth every other day.      . glyBURIDE-metformin (GLUCOVANCE) 5-500 MG per tablet Take 2 tablets by mouth 2 (two) times daily with a meal.       . hydrochlorothiazide (HYDRODIURIL) 25 MG tablet Take 25 mg by mouth every morning.       Marland Kitchen HYDROcodone-acetaminophen (VICODIN) 5-500 MG per tablet Take 1 tablet by mouth every 6 (six) hours as needed for pain.  20 tablet  0  . ibuprofen (ADVIL,MOTRIN) 200 MG tablet Take 400 mg by mouth every 8 (eight) hours as needed. For pain      . losartan (COZAAR) 100 MG tablet Take 100 mg by mouth every morning.       . meclizine (ANTIVERT) 25 MG tablet Take 25 mg by mouth daily as needed. For vertigo        . Rivaroxaban (XARELTO) 20 MG TABS Take 1 tablet (20 mg total) by mouth daily.  30 tablet  2  . simvastatin (ZOCOR) 40 MG tablet Take 40 mg by mouth at bedtime.      . sitaGLIPtin (JANUVIA) 100 MG tablet Take 100 mg by mouth every morning.        Physical Examination  Filed Vitals:   05/02/12 1522  BP: 121/78  Pulse: 124  Resp: 22    General: A&O x 3, WDWN,  Gait: limp Eyes: PERRLA, Pulmonary: CTAB, without wheezes , rales or rhonchi Cardiac: regular Rythm , without murmur                                                                  RIGHT                                       LEFT  CAROTID BRUIT Negative Negative  VASCULAR EXAM: Extremities with ischemic changes right second toe.     LOWER EXTREMITY PULSES           RIGHT                                      LEFT      FEMORAL  non-Dopplerable and palpable  weak and palpable        POPLITEAL  absent and palpable   absent and palpable       POSTERIOR TIBIAL  absent and palpable   weak and palpable        DORSALIS PEDIS      ANTERIOR TIBIAL absent and palpable   weak and palpable        PERONEAL  not examined    not examined    Abdomen: soft, NT, no masses Skin: no rashes, ulcers noted Musculoskeletal: no muscle wasting or atrophy  Neurologic: A&O X 3; Appropriate Affect ; SENSATION: normal; MOTOR FUNCTION:  moving all extremities equally. Speech is fluent/normal    Non-Invasive Vascular Imaging: DATE: 05/02/2012 No imaging today Please see Epic records for most recent imaging  ASSESSMENT: Jenna Brown is a 61 y.o. female who presents with incisional pain and drainage with nonhealing right lower extremity incisions that will require pain control beyond what she can obtain at home for complete and proper wound care.  PLAN: Based on the patient's vascular studies and examination Dr. Scot Dock has recommended the patient be admitted for pain control with dressing changes, antibiotics and evaluation by Dr.  Kellie Simmering who is on call and will speak with Dr. [name] who is the patient's surgeon to resume care tomorrow.  Judeth Horn ANP  Clinic MD: Scot Dock

## 2012-05-02 NOTE — Progress Notes (Signed)
[facility] [place] HISTORY AND PHYSICAL -PAD CC: Nonhealing right lower extremity surgical incisions Referring Physician: [name] was the surgeon  History of Present Illness  Jenna Brown is a 61 y.o. female patient who presents with chief complaint of right lower extremity pain with incision dressing changes and packing status post right and posterior tibial bypass in July with early occlusion and revision with known graft occlusion. The patient has been seen weekly for various concerns with her incisions including drainage and pain with her dressing changes and we were contacted today to evaluate the patient again. The patient's daughter states that her right groin wound as well as her lower extremity when is "nonhealing" and more than she can handle at home.   Past Medical History  Diagnosis Date  . Hyperlipidemia     takes Simvastatin daily  . Peripheral vascular disease   . Stroke     TIA history  . PONV (postoperative nausea and vomiting)   . Hypertension     takes Amlodipine/HCTZ/Losartan daily  . Myocardial infarction 1990's  . Headache     when b/p is elevated  . Vertigo     takes Ativert prn  . Hemorrhoids   . Diabetes mellitus     takes Glucovance and Januvia daily  . Anxiety     takes Xanax prn    Social History History  Substance Use Topics  . Smoking status: Former Smoker -- 40 years    Types: Cigarettes    Quit date: 02/27/2012  . Smokeless tobacco: Never Used  . Alcohol Use: No    Family History Family History  Problem Relation Age of Onset  . Cancer Mother     breast  . Diabetes Father     ROS: [x]  Positive   [ ]  Denies  General:[ ]  Weight loss,  [ ]  Weight gain, [ ]  Fever, [ ]  chills Neurologic: [ ]  Dizziness, [ ]  Blackouts, [ ]  Seizure [ ]  Stroke, [ ]  "Mini stroke", [ ]  Slurred speech, [ ]  Temporary blindness;  [ ] weakness, [ ]  Hoarseness Cardiac: [ ]  Chest pain/pressure, [ ]  Shortness of breath at rest [ ]  Shortness  of breath with exertion,  [ ]   Atrial fibrillation or irregular heartbeat Vascular:[ x] Pain in legs with walking, [x]  Pain in legs at rest ,[ ]  Pain in legs at night,  [x ]  Non-healing ulcer, [ ]  Blood clot in vein/DVT,   Pulmonary: [ ]  Home oxygen, [ ]   Productive cough, [ ]  Coughing up blood,  [ ]  Asthma,  [ ]  Wheezing Musculoskeletal:  [ ]  Arthritis, [ ]  Low back pain,  [ ]  Joint pain Hematologic:[ ]  Easy Bruising, [ ]  Anemia; [ ]  Hepatitis Gastrointestinal: [ ]  Blood in stool,  [ ]  Gastroesophageal Reflux, [ ]  Trouble swallowing Urinary: [ ]  chronic Kidney disease, [ ]  on HD, [ ]  Burning with urination, [ ]  Frequent urination, [ ]  Difficulty urinating;  Skin: [ ]  Rashes, [ ]  Wounds    Allergies  Allergen Reactions  . Codeine Other (See Comments)    "makes me feel strange"  . Plavix (Clopidogrel Bisulfate) Itching  . Tylenol (Acetaminophen) Other (See Comments)    "Doesn't feel right"   . Tylenol With Codeine #3 (Acetaminophen-Codeine) Other (See Comments)    unknown    Current Outpatient Prescriptions  Medication Sig Dispense Refill  . ALPRAZolam (XANAX) 0.25 MG tablet Take 0.25 mg by mouth daily as needed. For anxiety      .  amLODipine (NORVASC) 10 MG tablet Take 10 mg by mouth every morning.       Marland Kitchen aspirin 81 MG chewable tablet Chew 81 mg by mouth every other day.      . glyBURIDE-metformin (GLUCOVANCE) 5-500 MG per tablet Take 2 tablets by mouth 2 (two) times daily with a meal.       . hydrochlorothiazide (HYDRODIURIL) 25 MG tablet Take 25 mg by mouth every morning.       Marland Kitchen HYDROcodone-acetaminophen (VICODIN) 5-500 MG per tablet Take 1 tablet by mouth every 6 (six) hours as needed for pain.  20 tablet  0  . ibuprofen (ADVIL,MOTRIN) 200 MG tablet Take 400 mg by mouth every 8 (eight) hours as needed. For pain      . losartan (COZAAR) 100 MG tablet Take 100 mg by mouth every morning.       . meclizine (ANTIVERT) 25 MG tablet Take 25 mg by mouth daily as needed. For vertigo        . Rivaroxaban (XARELTO) 20 MG TABS Take 1 tablet (20 mg total) by mouth daily.  30 tablet  2  . simvastatin (ZOCOR) 40 MG tablet Take 40 mg by mouth at bedtime.      . sitaGLIPtin (JANUVIA) 100 MG tablet Take 100 mg by mouth every morning.        Physical Examination  Filed Vitals:   05/02/12 1522  BP: 121/78  Pulse: 124  Resp: 22    General: A&O x 3, WDWN,  Gait: limp Eyes: PERRLA, Pulmonary: CTAB, without wheezes , rales or rhonchi Cardiac: regular Rythm , without murmur                                                                  RIGHT                                       LEFT  CAROTID BRUIT Negative Negative  VASCULAR EXAM: Extremities with ischemic changes right second toe.     LOWER EXTREMITY PULSES           RIGHT                                      LEFT      FEMORAL  non-Dopplerable and palpable  weak and palpable        POPLITEAL  absent and palpable   absent and palpable       POSTERIOR TIBIAL  absent and palpable   weak and palpable        DORSALIS PEDIS      ANTERIOR TIBIAL absent and palpable   weak and palpable        PERONEAL  not examined    not examined    Abdomen: soft, NT, no masses Skin: no rashes, ulcers noted Musculoskeletal: no muscle wasting or atrophy  Neurologic: A&O X 3; Appropriate Affect ; SENSATION: normal; MOTOR FUNCTION:  moving all extremities equally. Speech is fluent/normal    Non-Invasive Vascular Imaging: DATE: 05/02/2012 No imaging today Please see Epic records for most recent imaging  ASSESSMENT: Jenna Brown is a 61 y.o. female who presents with incisional pain and drainage with nonhealing right lower extremity incisions that will require pain control beyond what she can obtain at home for complete and proper wound care.  PLAN: Based on the patient's vascular studies and examination Dr. Scot Dock has recommended the patient be admitted for pain control with dressing changes, antibiotics and evaluation by Dr.  Kellie Simmering who is on call and will speak with Dr. [name] who is the patient's surgeon to resume care tomorrow.  Judeth Horn ANP  Clinic MD: Scot Dock

## 2012-05-02 NOTE — Progress Notes (Addendum)
ANTIBIOTIC CONSULT NOTE - INITIAL  Pharmacy Consult for Vancomycin Indication: Right Leg Wound Infection  Allergies  Allergen Reactions  . Codeine Other (See Comments)    "makes me feel strange"  . Plavix (Clopidogrel Bisulfate) Itching  . Tylenol (Acetaminophen) Other (See Comments)    "Doesn't feel right"   . Tylenol With Codeine #3 (Acetaminophen-Codeine) Other (See Comments)    unknown    Patient Measurements: Weight: 68 kg on 05/02/12 Height: 163 cm on 05/02/12  Vital Signs: Temp: 97.5 F (36.4 C) (08/14 1633) Temp src: Oral (08/14 1633) BP: 125/87 mmHg (08/14 1633) Pulse Rate: 121  (08/14 1633) Intake/Output from previous day:   Intake/Output from this shift:    Labs: No results found for this basename: WBC:3,HGB:3,PLT:3,LABCREA:3,CREATININE:3 in the last 72 hours The CrCl is unknown because both a height and weight (above a minimum accepted value) are required for this calculation. No results found for this basename: VANCOTROUGH:2,VANCOPEAK:2,VANCORANDOM:2,GENTTROUGH:2,GENTPEAK:2,GENTRANDOM:2,TOBRATROUGH:2,TOBRAPEAK:2,TOBRARND:2,AMIKACINPEAK:2,AMIKACINTROU:2,AMIKACIN:2, in the last 72 hours   Microbiology: No results found for this or any previous visit (from the past 720 hour(s)).  Medical History: Past Medical History  Diagnosis Date  . Hyperlipidemia     takes Simvastatin daily  . Peripheral vascular disease   . Stroke     TIA history  . PONV (postoperative nausea and vomiting)   . Hypertension     takes Amlodipine/HCTZ/Losartan daily  . Myocardial infarction 1990's  . Headache     when b/p is elevated  . Vertigo     takes Ativert prn  . Hemorrhoids   . Diabetes mellitus     takes Glucovance and Januvia daily  . Anxiety     takes Xanax prn    Medications:  Anti-infectives     Start     Dose/Rate Route Frequency Ordered Stop   05/02/12 1645   piperacillin-tazobactam (ZOSYN) IVPB 3.375 g        3.375 g 12.5 mL/hr over 240 Minutes Intravenous 3  times per day 05/02/12 1635           Assessment: 51 YOF admitted with right LE pain after post right and posterior tibial bypass in July and revision of graft occlusion. Patient has been followed as outpatient for drainage and pain with dressing changes without improvement now to start IV Vancomycin per pharmacy dosing and Zosyn per MD dosing. Last SCr's from July admit were stable between 0.78 and 0.92. Current labs are pending. Urinalysis pending. No other culture data.    Goal of Therapy:  Vancomycin trough level 15-20 mcg/ml until rule out osteo.   Plan:  1. Vancomycin 750mg  IV q12h.  2. Follow-up CBC, BMET, and cultures if any ordered.  3. Zosyn dose ok for now.  4. Vancomycin trough at [facility] if appropriate.   Sloan Leiter, PharmD, BCPS Clinical Pharmacist [phone] 05/02/2012,4:42 PM  Addendum:   Patient's SCr now 2.34 with estimated CrCl~28 mL/min. Will adjust Vancomycin dose down to 750mg  IV q24h. Will adjust Zosyn dose to 2.25g IV q8h.  Will follow-up renal function and cultures.   Sloan Leiter, PharmD, BCPS Clinical Pharmacist [phone] 05/02/2012, 7:09 PM

## 2012-05-02 NOTE — Progress Notes (Deleted)
[facility] [place] HISTORY AND PHYSICAL -PAD CC: Nonhealing right lower extremity surgical incisions Referring Physician: [name] was the surgeon  History of Present Illness  Jenna Brown is a 61 y.o. female patient who presents with chief complaint of right lower extremity pain with incision dressing changes and packing status post right and posterior tibial bypass in July with early occlusion and revision with known graft occlusion. The patient has been seen weekly for various concerns with her incisions including drainage and pain with her dressing changes and we were contacted today to evaluate the patient again. The patient's daughter states that her right groin wound as well as her lower extremity when is "nonhealing" and more than she can handle at home.   Past Medical History  Diagnosis Date  . Hyperlipidemia     takes Simvastatin daily  . Peripheral vascular disease   . Stroke     TIA history  . PONV (postoperative nausea and vomiting)   . Hypertension     takes Amlodipine/HCTZ/Losartan daily  . Myocardial infarction 1990's  . Headache     when b/p is elevated  . Vertigo     takes Ativert prn  . Hemorrhoids   . Diabetes mellitus     takes Glucovance and Januvia daily  . Anxiety     takes Xanax prn    Social History History  Substance Use Topics  . Smoking status: Former Smoker -- 40 years    Types: Cigarettes    Quit date: 02/27/2012  . Smokeless tobacco: Never Used  . Alcohol Use: No    Family History Family History  Problem Relation Age of Onset  . Cancer Mother     breast  . Diabetes Father     ROS: [x]  Positive   [ ]  Denies  General:[ ]  Weight loss,  [ ]  Weight gain, [ ]  Fever, [ ]  chills Neurologic: [ ]  Dizziness, [ ]  Blackouts, [ ]  Seizure [ ]  Stroke, [ ]  "Mini stroke", [ ]  Slurred speech, [ ]  Temporary blindness;  [ ] weakness, [ ]  Hoarseness Cardiac: [ ]  Chest pain/pressure, [ ]  Shortness of breath at rest [ ]  Shortness  of breath with exertion,  [ ]   Atrial fibrillation or irregular heartbeat Vascular:[ x] Pain in legs with walking, [x]  Pain in legs at rest ,[ ]  Pain in legs at night,  [x ]  Non-healing ulcer, [ ]  Blood clot in vein/DVT,   Pulmonary: [ ]  Home oxygen, [ ]   Productive cough, [ ]  Coughing up blood,  [ ]  Asthma,  [ ]  Wheezing Musculoskeletal:  [ ]  Arthritis, [ ]  Low back pain,  [ ]  Joint pain Hematologic:[ ]  Easy Bruising, [ ]  Anemia; [ ]  Hepatitis Gastrointestinal: [ ]  Blood in stool,  [ ]  Gastroesophageal Reflux, [ ]  Trouble swallowing Urinary: [ ]  chronic Kidney disease, [ ]  on HD, [ ]  Burning with urination, [ ]  Frequent urination, [ ]  Difficulty urinating;  Skin: [ ]  Rashes, [ ]  Wounds    Allergies  Allergen Reactions  . Codeine Other (See Comments)    "makes me feel strange"  . Plavix (Clopidogrel Bisulfate) Itching  . Tylenol (Acetaminophen) Other (See Comments)    "Doesn't feel right"   . Tylenol With Codeine #3 (Acetaminophen-Codeine) Other (See Comments)    unknown    Current Outpatient Prescriptions  Medication Sig Dispense Refill  . ALPRAZolam (XANAX) 0.25 MG tablet Take 0.25 mg by mouth daily as needed. For anxiety      .  amLODipine (NORVASC) 10 MG tablet Take 10 mg by mouth every morning.       Marland Kitchen aspirin 81 MG chewable tablet Chew 81 mg by mouth every other day.      . glyBURIDE-metformin (GLUCOVANCE) 5-500 MG per tablet Take 2 tablets by mouth 2 (two) times daily with a meal.       . hydrochlorothiazide (HYDRODIURIL) 25 MG tablet Take 25 mg by mouth every morning.       Marland Kitchen HYDROcodone-acetaminophen (VICODIN) 5-500 MG per tablet Take 1 tablet by mouth every 6 (six) hours as needed for pain.  20 tablet  0  . ibuprofen (ADVIL,MOTRIN) 200 MG tablet Take 400 mg by mouth every 8 (eight) hours as needed. For pain      . losartan (COZAAR) 100 MG tablet Take 100 mg by mouth every morning.       . meclizine (ANTIVERT) 25 MG tablet Take 25 mg by mouth daily as needed. For vertigo        . Rivaroxaban (XARELTO) 20 MG TABS Take 1 tablet (20 mg total) by mouth daily.  30 tablet  2  . simvastatin (ZOCOR) 40 MG tablet Take 40 mg by mouth at bedtime.      . sitaGLIPtin (JANUVIA) 100 MG tablet Take 100 mg by mouth every morning.        Physical Examination  Filed Vitals:   05/02/12 1522  BP: 121/78  Pulse: 124  Resp: 22    General: A&O x 3, WDWN,  Gait: limp Eyes: PERRLA, Pulmonary: CTAB, without wheezes , rales or rhonchi Cardiac: regular Rythm , without murmur                                                                  RIGHT                                       LEFT  CAROTID BRUIT Negative Negative  VASCULAR EXAM: Extremities with ischemic changes right second toe.     LOWER EXTREMITY PULSES           RIGHT                                      LEFT      FEMORAL  non-Dopplerable and palpable  weak and palpable        POPLITEAL  absent and palpable   absent and palpable       POSTERIOR TIBIAL  absent and palpable   weak and palpable        DORSALIS PEDIS      ANTERIOR TIBIAL absent and palpable   weak and palpable        PERONEAL  not examined    not examined    Abdomen: soft, NT, no masses Skin: no rashes, ulcers noted Musculoskeletal: no muscle wasting or atrophy  Neurologic: A&O X 3; Appropriate Affect ; SENSATION: normal; MOTOR FUNCTION:  moving all extremities equally. Speech is fluent/normal    Non-Invasive Vascular Imaging: DATE: 05/02/2012 No imaging today Please see Epic records for most recent imaging  ASSESSMENT: Jenna Brown is a 61 y.o. female who presents with incisional pain and drainage with nonhealing right lower extremity incisions that will require pain control beyond what she can obtain at home for complete and proper wound care.  PLAN: Based on the patient's vascular studies and examination Dr. Scot Dock has recommended the patient be admitted for pain control with dressing changes, antibiotics and evaluation by Dr.  Kellie Simmering who is on call and will speak with Dr. [name] who is the patient's surgeon to resume care tomorrow.  Judeth Horn ANP  Clinic MD: Scot Dock

## 2012-05-02 NOTE — Telephone Encounter (Signed)
Coralyn Mark called to say that Jenna Brown groin site was healing well. However the lower aspect of her right leg near the ankle was draining a purulent, milky drainage and tunneling up to 12:00. Jenna Brown is also having a lot of pain. I talked with  Dr Scot Dock and he said to bring her in to the office today. She has an appt at 2:40 with Weston Brass

## 2012-05-03 LAB — GLUCOSE, CAPILLARY
Glucose-Capillary: 208 mg/dL — ABNORMAL HIGH (ref 70–99)
Glucose-Capillary: 173 mg/dL — ABNORMAL HIGH (ref 70–99)
Glucose-Capillary: 141 mg/dL — ABNORMAL HIGH (ref 70–99)

## 2012-05-03 LAB — URINALYSIS, ROUTINE W REFLEX MICROSCOPIC
Bilirubin Urine: NEGATIVE
Glucose, UA: NEGATIVE mg/dL
Hgb urine dipstick: NEGATIVE
Ketones, ur: NEGATIVE mg/dL
Protein, ur: NEGATIVE mg/dL
pH: 5 (ref 5.0–8.0)

## 2012-05-03 LAB — HEMOGLOBIN A1C: Mean Plasma Glucose: 157 mg/dL — ABNORMAL HIGH (ref <117)

## 2012-05-03 MED ORDER — SODIUM CHLORIDE 0.9 % IV SOLN
INTRAVENOUS | Status: DC
  Administered 2012-05-03 – 2012-05-04 (×2): via INTRAVENOUS

## 2012-05-03 MED ORDER — ATORVASTATIN CALCIUM 20 MG PO TABS
20.0000 mg | ORAL_TABLET | Freq: Every day | ORAL | Status: DC
  Administered 2012-05-03: 20 mg via ORAL
  Filled 2012-05-03 (×2): qty 1

## 2012-05-03 MED ORDER — JUVEN PO PACK
1.0000 | PACK | Freq: Two times a day (BID) | ORAL | Status: DC
  Administered 2012-05-03 – 2012-05-04 (×2): 1 via ORAL
  Filled 2012-05-03 (×3): qty 1

## 2012-05-03 NOTE — Progress Notes (Signed)
[facility] Progress Note  05/03/2012 8:06 AM HD 1  Subjective:  No complaints  Tm 99.6   HR  120s-130s Filed Vitals:   05/03/12 0432  BP: 110/69  Pulse: 126  Temp: 99.6 F (37.6 C)  Resp: 19    Physical Exam: Lungs:  Non labored Extremities:  RLE wound open.  No purulent drainage;  Right groin wound with good granulation tissue and healing nicely.  CBC    Component Value Date/Time   WBC 16.7* 05/02/2012 1725   RBC 4.12 05/02/2012 1725   HGB 10.5* 05/02/2012 1725   HCT 32.8* 05/02/2012 1725   PLT 458* 05/02/2012 1725   MCV 79.6 05/02/2012 1725   MCH 25.5* 05/02/2012 1725   MCHC 32.0 05/02/2012 1725   RDW 13.3 05/02/2012 1725   LYMPHSABS 2.9 04/08/2012 0540   MONOABS 1.4* 04/08/2012 0540   EOSABS 0.1 04/08/2012 0540   BASOSABS 0.0 04/08/2012 0540    BMET    Component Value Date/Time   NA 135 05/02/2012 1725   K 5.0 05/02/2012 1725   CL 95* 05/02/2012 1725   CO2 24 05/02/2012 1725   GLUCOSE 189* 05/02/2012 1725   BUN 44* 05/02/2012 1725   CREATININE 2.34* 05/02/2012 1725   CALCIUM 9.9 05/02/2012 1725   GFRNONAA 22* 05/02/2012 1725   GFRAA 25* 05/02/2012 1725    INR    Component Value Date/Time   INR 1.35 05/02/2012 1725    No intake or output data in the 24 hours ending 05/03/12 0806   Assessment/Plan:  61 y.o. female is  status post right and posterior tibial bypass in July with early occlusion and revision with known graft occlusion who presents now with possible wound infection.  HD 1  -pt creatinine is up from previous discharge-will discontinue glucophage and cozaar.  Probably from dehydration-will start IVF -will get DM coordinator to help manage glycemic control. -WBC is elevated-continue ABx. -continue wet to dry dressings. -RLE wound concerning for wound healing due to the pt bypass is occluded.  Will continue to do dressing changes and watch wound.  Leontine Locket, PA-C [facility] [phone] 05/03/2012 8:06 AM

## 2012-05-03 NOTE — Progress Notes (Signed)
Wet to dry dressing done per Md order on Right groin and Right leg, pt tolerated well. Will continue to monitor.

## 2012-05-03 NOTE — Clinical Social Work Placement (Signed)
Clinical Social Work Department CLINICAL SOCIAL WORK PLACEMENT NOTE 05/03/2012  Patient:  Jenna Brown  Account Number:  [id number] Admit date:  04/30/2012  Clinical Social Worker:  Wylene Men  Date/time:  05/03/2012 01:35 PM  Clinical Social Work is seeking post-discharge placement for this patient at the following level of care:   [place]   (*CSW will update this form in Epic as items are completed)   05/03/2012  Patient/family provided with [place] Department of Clinical Social Work's list of facilities offering this level of care within the geographic area requested by the patient (or if unable, by the patient's family).  05/03/2012  Patient/family informed of their freedom to choose among providers that offer the needed level of care, that participate in Medicare, Medicaid or managed care program needed by the patient, have an available bed and are willing to accept the patient.  05/03/2012  Patient/family informed of [facility]' ownership interest in [facility], as well as of the fact that they are under no obligation to receive care at this facility.  PASARR submitted to EDS on 05/03/2012 PASARR number received from EDS on 05/03/2012  FL2 transmitted to all facilities in geographic area requested by pt/family on  05/03/2012 FL2 transmitted to all facilities within larger geographic area on   Patient informed that his/her managed care company has contracts with or will negotiate with  certain facilities, including the following:     Patient/family informed of bed offers received:   Patient chooses bed at  Physician recommends and patient chooses bed at    Patient to be transferred to  on   Patient to be transferred to facility by   The following physician request were entered in Epic:   Additional Comments: None  Nonnie Done, [place] [phone]  Clinical Social Work

## 2012-05-03 NOTE — Clinical Social Work Psychosocial (Signed)
Clinical Social Work Department BRIEF PSYCHOSOCIAL ASSESSMENT 05/03/2012  Patient:  Jenna Brown     Account Number:  [id number]     Admit date:  05/02/2012  Clinical Social Worker:  Wylene Men  Date/Time:  05/03/2012 02:10 PM  Referred by:  RN  Date Referred:  05/03/2012 Referred for  SNF Placement   Other Referral:   none   Interview type:  Patient Other interview type:   none    PSYCHOSOCIAL DATA Living Status:  ALONE Admitted from facility:   Level of care:   Primary support name:  none Primary support relationship to patient:   Degree of support available:   fair    CURRENT CONCERNS Current Concerns  Post-Acute Placement   Other Concerns:   none    SOCIAL WORK ASSESSMENT / PLAN CSW assessed pt at bedside.  Pt had recently had pain meds. She stated they made her feel funny and talk slow.  CSW questioned pt re: d/c plans.  Pt stated that she lived alone and that was getting hard bec she was falling around. Pt felt as if she needed to go to a SNF to gain some strength from her fall.  CSW will f/u with SNF search.  Pt did not have a preference as to what facility.   Assessment/plan status:  Psychosocial Support/Ongoing Assessment of Needs Other assessment/ plan:   none   Information/referral to community resources:   SNF    PATIENT'S/FAMILY'S RESPONSE TO PLAN OF CARE: Pt was very responsive and appreciative of CSW assisting with d/c plans.        Jenna Brown, Jenna Brown [phone]  Clinical Social Work

## 2012-05-03 NOTE — Progress Notes (Signed)
INITIAL ADULT NUTRITION ASSESSMENT Date: 05/03/2012   Time: 1:56 PM Reason for Assessment: Nutrition Risk- Unintentional weight loss  ASSESSMENT: Female 61 y.o.  Dx: s/p tibial bypass in July  Past Medical History  Diagnosis Date  . Hyperlipidemia     takes Simvastatin daily  . Peripheral vascular disease   . Stroke     TIA history  . PONV (postoperative nausea and vomiting)   . Hypertension     takes Amlodipine/HCTZ/Losartan daily  . Vertigo     takes Ativert prn  . Hemorrhoids   . Anxiety     takes Xanax prn  . Myocardial infarction 1990's  . Type II diabetes mellitus     takes Glucovance and Januvia daily  . H/O hiatal hernia   . GERD (gastroesophageal reflux disease)   . Headache     when b/p is elevated  . Migraines   . Arthritis     Scheduled Meds:    . amLODipine  10 mg Oral q morning - 10a  . glyBURIDE  5 mg Oral BID WC  . insulin aspart  0-15 Units Subcutaneous TID WC  . linagliptin  5 mg Oral Daily  . pantoprazole  40 mg Oral Q1200  . piperacillin-tazobactam (ZOSYN)  IV  2.25 g Intravenous Q8H  . Rivaroxaban  20 mg Oral NOW  . Rivaroxaban  20 mg Oral Q24H  . senna  1 tablet Oral BID  . simvastatin  40 mg Oral Daily  . vancomycin  750 mg Intravenous Q24H  . DISCONTD: enoxaparin (LOVENOX) injection  40 mg Subcutaneous Q24H  . DISCONTD: glyBURIDE-metformin  1 tablet Oral BID WC  . DISCONTD: hydrochlorothiazide  25 mg Oral q morning - 10a  . DISCONTD: losartan  100 mg Oral q morning - 10a  . DISCONTD: losartan  100 mg Oral Daily  . DISCONTD: metFORMIN  500 mg Oral BID WC  . DISCONTD: piperacillin-tazobactam (ZOSYN)  IV  3.375 g Intravenous Q8H  . DISCONTD: potassium chloride  20-40 mEq Oral Once  . DISCONTD: vancomycin  750 mg Intravenous Q12H   Continuous Infusions:    . sodium chloride 75 mL/hr at 05/03/12 1244   PRN Meds:.ALPRAZolam, alum & mag hydroxide-simeth, guaiFENesin-dextromethorphan, hydrALAZINE, HYDROcodone-acetaminophen, ibuprofen,  labetalol, meclizine, metoprolol, morphine injection, ondansetron, oxyCODONE, phenol, DISCONTD: temazepam   Ht:  162.6 cm  Wt:  68.04 kg  Ideal Wt:    54.5 kg % Ideal Wt: 125%  Usual Wt: 167 lb, per pt (10% weight loss in 3 weeks classified as severe % weight loss) % Usual Wt: 90%  BMI: 25.7 kg/m2  Food/Nutrition Related Hx:  Noted dehisced wounds on right groin area and right leg. Pt reports decreased appetite for 3 weeks, stating "food just don't taste good." Pt was drinking Ensure during visit, but ate very little of meal tray. Pt interested in trying Juven BID to heal promote wound healing.   Labs:  CMP     Component Value Date/Time   NA 135 05/02/2012 1725   K 5.0 05/02/2012 1725   CL 95* 05/02/2012 1725   CO2 24 05/02/2012 1725   GLUCOSE 189* 05/02/2012 1725   BUN 44* 05/02/2012 1725   CREATININE 2.34* 05/02/2012 1725   CALCIUM 9.9 05/02/2012 1725   PROT 7.7 05/02/2012 1725   ALBUMIN 3.0* 05/02/2012 1725   AST 10 05/02/2012 1725   ALT 8 05/02/2012 1725   ALKPHOS 123* 05/02/2012 1725   BILITOT 0.5 05/02/2012 1725   GFRNONAA 22* 05/02/2012 1725  GFRAA 25* 05/02/2012 1725    CBG (last 3)   Basename 05/03/12 1125 05/03/12 0625 05/02/12 2107  GLUCAP 137* 208* 197*     Intake/Output Summary (Last 24 hours) at 05/03/12 1356 Last data filed at 05/03/12 1354  Gross per 24 hour  Intake 658.75 ml  Output    250 ml  Net 408.75 ml    Diet Order: Carb Control, Medium calorie (5% PO intake documented)  Supplements/Tube Feeding: None at this time.  IVF:     sodium chloride Last Rate: 75 mL/hr at 05/03/12 1244    Estimated Nutritional Needs:   Kcal: 1850-2000 Protein: 80-90 gm Fluid: >2.0 L  NUTRITION DIAGNOSIS: -Inadequate oral intake (NI-2.1).  Status: Ongoing  RELATED TO: decreased appetite  AS EVIDENCE BY: 5% PO intake documented  MONITORING/EVALUATION(Goals): Goal: Pt to consume >50% meals and 100% Juven supplements. Monitor: PO intake, supplement tolerance,  labs, weight, wounds  EDUCATION NEEDS: -No education needs identified at this time  INTERVENTION: Encourage PO intake at meals. Will order Juven BID to help promote wound healing.   DOCUMENTATION CODES Per approved criteria  -Severe malnutrition in the context of acute illness or injury    [place] Gum 05/03/2012, 1:56 PM

## 2012-05-03 NOTE — Care Management Note (Unsigned)
    Page 1 of 1   05/03/2012     11:06:52 AM   CARE MANAGEMENT NOTE 05/03/2012  Patient:  Jenna Brown, Jenna Brown   Account Number:  [id number]  Date Initiated:  05/03/2012  Documentation initiated by:  SIMMONS,Akaylah Lalley  Subjective/Objective Assessment:   ADMITTED WITH WOUND DEHISCENCE; LIVES AT HOME ALONE; HAS DME- RW, 3 N 1, SHOWER STOOL; STATED SHE HAS NO HELP AT HOME; REQUESTING ST SNF AT D/C.     Action/Plan:   DISCHARGE PLANNING DISCUSSED AT BEDSIDE. REFERRAL PLACED TO CSW.   Anticipated DC Date:  05/04/2012   Anticipated DC Plan:  SKILLED NURSING FACILITY  In-house referral  Clinical Social Worker      DC Planning Services  CM consult      Choice offered to / List presented to:             Status of service:  In process, will continue to follow Medicare Important Message given?   (If response is "NO", the following Medicare IM given date fields will be blank) Date Medicare IM given:   Date Additional Medicare IM given:    Discharge Disposition:    Per UR Regulation:  Reviewed for med. necessity/level of care/duration of stay  If discussed at Long Length of Stay Meetings, dates discussed:    Comments:  05/03/12  [place], BSN [phone] [facility] [place].

## 2012-05-03 NOTE — Progress Notes (Signed)
Pt HR 130-135, B/P 117/77, pt asymptomatic. PRN medication given, will continue to monitor and reassess HR.

## 2012-05-03 NOTE — Clinical Social Work Placement (Addendum)
Clinical Social Work Department CLINICAL SOCIAL WORK PLACEMENT NOTE 05/03/2012  Patient:  SHAMYRA, KINSLER  Account Number:  [id number] Admit date:  05/02/2012  Clinical Social Worker:  Wylene Men  Date/time:  05/03/2012 04:16 PM  Clinical Social Work is seeking post-discharge placement for this patient at the following level of care:   SKILLED NURSING   (*CSW will update this form in Epic as items are completed)   05/03/2012  Patient/family provided with [place] Department of Clinical Social Work's list of facilities offering this level of care within the geographic area requested by the patient (or if unable, by the patient's family).  05/03/2012  Patient/family informed of their freedom to choose among providers that offer the needed level of care, that participate in Medicare, Medicaid or managed care program needed by the patient, have an available bed and are willing to accept the patient.  05/03/2012  Patient/family informed of [facility]' ownership interest in [facility], as well as of the fact that they are under no obligation to receive care at this facility.  PASARR submitted to EDS on 05/03/2012 PASARR number received from EDS on 05/03/2012  FL2 transmitted to all facilities in geographic area requested by pt/family on  05/03/2012 FL2 transmitted to all facilities within larger geographic area on   Patient informed that his/her managed care company has contracts with or will negotiate with  certain facilities, including the following:     Patient/family informed of bed offers received:  05/04/2012 Patient chooses bed at [facility] and [place] recommends and patient chooses bed at  n/a  Patient to be transferred to __[place]___  on  05/04/2012 Patient to be transferred to facility by [facility]  The following physician request were entered in Epic:   Additional Comments: Pt daughter Doren Custard will meet pt at [place], [place] with  clothes.   Nonnie Done, [place] [phone]  Clinical Social Work

## 2012-05-03 NOTE — Progress Notes (Addendum)
Inpatient Diabetes Program Recommendations  AACE/ADA: New Consensus Statement on Inpatient Glycemic Control  Target Ranges:  Prepandial:   less than 140 mg/dL      Peak postprandial:   less than 180 mg/dL (1-2 hours)      Critically ill patients:  140 - 180 mg/dL  Pager:  [phone] Hours:  8 am-10pm   Reason for Visit: Referral  Inpatient Diabetes Program Recommendations  Insulin - Basal: Since Metformin has been discontinued due to renal function,  add Lantus 15 units daily based on weight  Note: If patient to be discharged home on insulin, begin insulin administration instruction with patient.  Courtney Heys PhD, RN, BC-ADM Diabetes Coordinator  Office:  [phone] Team Pager:  [phone]

## 2012-05-04 ENCOUNTER — Telehealth: Payer: Self-pay | Admitting: Surgery

## 2012-05-04 ENCOUNTER — Encounter: Payer: Self-pay | Admitting: Neurosurgery

## 2012-05-04 LAB — GLUCOSE, CAPILLARY: Glucose-Capillary: 174 mg/dL — ABNORMAL HIGH (ref 70–99)

## 2012-05-04 LAB — BASIC METABOLIC PANEL
GFR calc Af Amer: 67 mL/min — ABNORMAL LOW (ref >90)
GFR calc non Af Amer: 57 mL/min — ABNORMAL LOW (ref >90)
Potassium: 4.3 mEq/L (ref 3.5–5.1)
Sodium: 138 mEq/L (ref 135–145)

## 2012-05-04 LAB — CBC
MCHC: 32 g/dL (ref 30.0–36.0)
RDW: 13.4 % (ref 11.5–15.5)

## 2012-05-04 MED ORDER — HYDROCODONE-ACETAMINOPHEN 5-500 MG PO TABS: 1.0000 | ORAL_TABLET | Freq: Four times a day (QID) | ORAL | Status: DC | PRN

## 2012-05-04 MED ORDER — VANCOMYCIN HCL 1000 MG IV SOLR
1250.0000 mg | INTRAVENOUS | Status: DC
  Filled 2012-05-04: qty 1250

## 2012-05-04 MED ORDER — LEVOFLOXACIN 500 MG PO TABS: 500.0000 mg | ORAL_TABLET | Freq: Every day | ORAL | Status: DC

## 2012-05-04 MED ORDER — COLLAGENASE 250 UNIT/GM EX OINT: TOPICAL_OINTMENT | Freq: Every day | CUTANEOUS | Status: DC

## 2012-05-04 MED ORDER — PIPERACILLIN-TAZOBACTAM 3.375 G IVPB
3.3750 g | Freq: Three times a day (TID) | INTRAVENOUS | Status: DC
  Filled 2012-05-04 (×3): qty 50

## 2012-05-04 MED ORDER — JUVEN PO PACK: 1.0000 | PACK | Freq: Two times a day (BID) | ORAL | Status: DC

## 2012-05-04 NOTE — Discharge Summary (Signed)
[facility] Discharge Summary   Patient ID:  Jenna Brown MRN: [id number] DOB/AGE: 59-Sep-1952 59 y.o.  Admit date: 05/02/2012 Discharge date: 05/04/2012 Date of Surgery: 04/05/12 1.) thrombectomy and revision right femoral posterior tibial bypass graft  2.) Redo right common femoral and posterior tibial artery exposure  3.) Patch angioplasty right posterior tibial artery bypass graft with bovine patch  Surgeon: Theotis Burrow, MD  Admission Diagnosis: wound dehiscence  Discharge Diagnoses:  wound dehiscence  Secondary Diagnoses: Past Medical History  Diagnosis Date  . Hyperlipidemia     takes Simvastatin daily  . Peripheral vascular disease   . Stroke     TIA history  . PONV (postoperative nausea and vomiting)   . Hypertension     takes Amlodipine/HCTZ/Losartan daily  . Vertigo     takes Ativert prn  . Hemorrhoids   . Anxiety     takes Xanax prn  . Myocardial infarction 1990's  . Type II diabetes mellitus     takes Glucovance and Januvia daily  . H/O hiatal hernia   . GERD (gastroesophageal reflux disease)   . Headache     when b/p is elevated  . Migraines   . Arthritis       Discharged Condition: good  HPI:  Jenna Brown is a 61 y.o. female patient who presents with chief complaint of right lower extremity pain with incision dressing changes and packing status post right and posterior tibial bypass in July with early occlusion and revision with known graft occlusion. The patient has been seen weekly for various concerns with her incisions including drainage and pain with her dressing changes and we were contacted today to evaluate the patient again. The patient's daughter states that her right groin wound as well as her lower extremity when is "nonhealing" and more than she can handle at home.  Pt was admitted for pain control and IV antibiotics as well as wound care  Hospital Course:  Jenna Brown is a 61 y.o. female is S/P 1.)  thrombectomy and revision right femoral posterior tibial bypass graft  2.) Redo right common femoral and posterior tibial artery exposure  3.) Patch angioplasty right posterior tibial artery bypass graft with bovine patch She was admitted for IV antibiotics, wound care and pain control  wounds healing well- twice daily wet to dry dressings Pt. Ambulating, voiding and taking PO diet without difficulty. Pt pain controlled with PO pain meds. Labs as below Complications:none  Consults:     Significant Diagnostic Studies: CBC Lab Results  Component Value Date   WBC 20.1* 05/04/2012   HGB 8.8* 05/04/2012   HCT 27.5* 05/04/2012   MCV 80.6 05/04/2012   PLT 411* 05/04/2012    BMET    Component Value Date/Time   NA 138 05/04/2012 0617   K 4.3 05/04/2012 0617   CL 99 05/04/2012 0617   CO2 25 05/04/2012 0617   GLUCOSE 193* 05/04/2012 0617   BUN 27* 05/04/2012 0617   CREATININE 1.05 05/04/2012 0617   CALCIUM 9.5 05/04/2012 0617   GFRNONAA 57* 05/04/2012 0617   GFRAA 67* 05/04/2012 0617   COAG Lab Results  Component Value Date   INR 1.35 05/02/2012   INR 1.71* 04/01/2012   INR 1.09 03/31/2012     Disposition:  Discharge to :Skilled nursing facility Discharge Orders    Future Appointments: Provider: Department: Dept Phone: Center:   05/07/2012 9:00 AM Princess Perna, NP [facility] [phone] [facility]     Future  Orders Please Complete By Expires   Resume previous diet      Call MD for:  temperature >100.5      Call MD for:  redness, tenderness, or signs of infection (pain, swelling, bleeding, redness, odor or green/yellow discharge around incision site)      Call MD for:  severe or increased pain, loss or decreased feeling  in affected limb(s)      Increase activity slowly      Comments:   Walk with assistance use walker or cane as needed   Walker       Discharge wound care:      Comments:   Wet to dry dressing to right groin twice daily  Please gently pack under ledge of right lower  leg wound with santyl and wet to dry dressing once daily      Jenna Brown, Jenna Brown  Home Medication Instructions [id number]   Printed on:05/04/12 1334  Medication Information                    glyBURIDE-metformin (GLUCOVANCE) 5-500 MG per tablet Take 1 tablet by mouth 2 (two) times daily with a meal.            ALPRAZolam (XANAX) 0.25 MG tablet Take 0.25 mg by mouth daily as needed. For anxiety           meclizine (ANTIVERT) 25 MG tablet Take 25 mg by mouth daily as needed. For vertigo           losartan (COZAAR) 100 MG tablet Take 100 mg by mouth every morning.            hydrochlorothiazide (HYDRODIURIL) 25 MG tablet Take 25 mg by mouth every morning.            amLODipine (NORVASC) 10 MG tablet Take 10 mg by mouth every morning.            sitaGLIPtin (JANUVIA) 100 MG tablet Take 100 mg by mouth every morning.           simvastatin (ZOCOR) 40 MG tablet Take 40 mg by mouth every morning.            Rivaroxaban (XARELTO) 20 MG TABS Take 20 mg by mouth every evening. At 4pm.           HYDROcodone-acetaminophen (VICODIN) 5-500 MG per tablet Take 1 tablet by mouth every 6 (six) hours as needed for pain.           nutrition supplement (JUVEN) PACK Take 1 packet by mouth 2 (two) times daily between meals.           collagenase (SANTYL) ointment Apply topically daily. To right lower leg wound           levofloxacin (LEVAQUIN) 500 MG tablet Take 1 tablet (500 mg total) by mouth daily.            Verbal and written Discharge instructions given to the patient. Wound care per Discharge AVS Follow-up Information    Follow up with [name] IV, [place], MD in 2 weeks. (office will arrange -sent)    Contact information:   [address] [place] [phone]          Signed: Richrd Prime 05/04/2012, 1:34 PM

## 2012-05-04 NOTE — Progress Notes (Signed)
Pt given discharge instructions, medication lists, follow up appointments, and when to call the doctor.  Pt verbalizes understanding. Pt given signs and symptoms of infection. Pt given paperwork to give her children when they arrived at [facility].  [facility] transported pt. Jenna Brown

## 2012-05-04 NOTE — Progress Notes (Signed)
  VASCULAR AND VEIN SURGERY PROGRESS NOTE  Progress note  Date of Surgery: Infected right distal by-pass graft  Surgeon:     HPI: Jenna Brown is a 61 y.o. female right lower extremity pain with incisional drainage.  The pain prevents her from bearing weight on the right foot.     Significant Diagnostic Studies: CBC    Component Value Date/Time   WBC 20.1* 05/04/2012 0617   RBC 3.41* 05/04/2012 0617   HGB 8.8* 05/04/2012 0617   HCT 27.5* 05/04/2012 0617   PLT 411* 05/04/2012 0617   MCV 80.6 05/04/2012 0617   MCH 25.8* 05/04/2012 0617   MCHC 32.0 05/04/2012 0617   RDW 13.4 05/04/2012 0617   LYMPHSABS 2.9 04/08/2012 0540   MONOABS 1.4* 04/08/2012 0540   EOSABS 0.1 04/08/2012 0540   BASOSABS 0.0 04/08/2012 0540    BMET    Component Value Date/Time   NA 135 05/02/2012 1725   K 5.0 05/02/2012 1725   CL 95* 05/02/2012 1725   CO2 24 05/02/2012 1725   GLUCOSE 189* 05/02/2012 1725   BUN 44* 05/02/2012 1725   CREATININE 2.34* 05/02/2012 1725   CALCIUM 9.9 05/02/2012 1725   GFRNONAA 22* 05/02/2012 1725   GFRAA 25* 05/02/2012 1725    COAG Lab Results  Component Value Date   INR 1.35 05/02/2012   INR 1.71* 04/01/2012   INR 1.09 03/31/2012   No results found for this basename: PTT     I/O last 3 completed shifts: In: 778.8 [P.O.:480; I.V.:298.8] Out: 900 [Urine:900]  Physical Examination  Patient Vitals for the past 24 hrs:  BP Temp Temp src Pulse Resp SpO2  05/04/12 0426 108/68 mmHg 99.6 F (37.6 C) Oral 114  19  91 %  05/03/12 2027 118/69 mmHg 99.5 F (37.5 C) Oral 122  18  90 %  05/03/12 1355 118/70 mmHg 99 F (37.2 C) - 100  18  94 %    Lungs: Non labored  Extremities: RLE wound open. No purulent drainage; Right groin wound with good granulation tissue and healing nicely.  Distal leg dressing clean and dry changed last night per nursing Distal tip of second right toe dark and cool to touch, no active drainage.      Assessment/Plan  Jenna Brown is a 61 y.o. year old  female who is S/P Fem-pop graft with non healing wounds right LE and pain issues.   RLE wound concerning for wound healing due to the pt bypass is occluded. Will continue to do dressing changes and watch wound. I will return later for a dressing change to exam the wound. Cont. Antibiotics.   [name] [facility] 05/04/2012 7:26 AM

## 2012-05-04 NOTE — Discharge Summary (Signed)
Agree with above Patient aware that this is a limb threatening situation Will treat with Abx and have her follow up in the office in 1 week  [place]

## 2012-05-04 NOTE — Progress Notes (Signed)
ANTIBIOTIC CONSULT NOTE - follow up Pharmacy Consult for Vancomycin and zosyn Indication: nonhealing RLE incisions  Allergies  Allergen Reactions  . Plavix (Clopidogrel Bisulfate) Itching  . Codeine Other (See Comments)    "makes me feel strange"  . Tylenol (Acetaminophen) Other (See Comments)    "Doesn't feel right"   . Tylenol With Codeine #3 (Acetaminophen-Codeine) Other (See Comments)    "doesn't make me feel right"    Patient Measurements: Weight: 68 kg on 05/02/12 Height: 163 cm on 05/02/12  Vital Signs: Temp: 99.6 F (37.6 C) (08/16 0426) Temp src: Oral (08/16 0426) BP: 108/68 mmHg (08/16 0426) Pulse Rate: 114  (08/16 0426) Intake/Output from previous day: 08/15 0701 - 08/16 0700 In: 778.8 [P.O.:480; I.V.:298.8] Out: 900 [Urine:900] Intake/Output from this shift:    Labs:  [facility] 05/04/12 0617 05/02/12 1725  WBC 20.1* 16.7*  HGB 8.8* 10.5*  PLT 411* 458*  LABCREA -- --  CREATININE 1.05 2.34*   Estimated Creatinine Clearance: 55.6 ml/min (by C-G formula based on Cr of 1.05). No results found for this basename: VANCOTROUGH:2,VANCOPEAK:2,VANCORANDOM:2,GENTTROUGH:2,GENTPEAK:2,GENTRANDOM:2,TOBRATROUGH:2,TOBRAPEAK:2,TOBRARND:2,AMIKACINPEAK:2,AMIKACINTROU:2,AMIKACIN:2, in the last 72 hours   Microbiology: No results found for this or any previous visit (from the past 720 hour(s)).  Medical History: Past Medical History  Diagnosis Date  . Hyperlipidemia     takes Simvastatin daily  . Peripheral vascular disease   . Stroke     TIA history  . PONV (postoperative nausea and vomiting)   . Hypertension     takes Amlodipine/HCTZ/Losartan daily  . Vertigo     takes Ativert prn  . Hemorrhoids   . Anxiety     takes Xanax prn  . Myocardial infarction 1990's  . Type II diabetes mellitus     takes Glucovance and Januvia daily  . H/O hiatal hernia   . GERD (gastroesophageal reflux disease)   . Headache     when b/p is elevated  . Migraines   . Arthritis      Assessment: 34 YOF admitted with right LE pain after posterior tibial bypass in July and revision of graft occlusion.  She is on day # 2 of vanc/zosyn for nonhealing RLE wounds.  Her creatinine has dramatically improved from 2.34 to 1.05 and creat cl ~ 55 ml/min.  Her WBC is up to 20.1 from 16.7.  T max 99.6. No cultures drawn.  Goal of Therapy:  Vancomycin trough 10-15 mcg/ml for cellulitis  Plan:  1. Change Vancomycin 750mg  IV q24h to vancomycin 1250 mg IV q24 hours 2. Increase zosyn 2.25 gm q8h to zosyn 3.375 gm IV q8h each dose infused over 4 hours  3. Vancomycin trough at [facility] if appropriate. 4. F/u renal function and plans. Eudelia Bunch, Pharm.D. BP:[id number] 05/04/2012 10:00 AM

## 2012-05-04 NOTE — H&P (Signed)
Jenna L, NP 05/02/2012 3:51 PM Signed  [facility] [place]  HISTORY AND PHYSICAL -PAD  CC: Nonhealing right lower extremity surgical incisions  Referring Physician: [name] was the surgeon  History of Present Illness  Jenna Brown is a 61 y.o. female patient who presents with chief complaint of right lower extremity pain with incision dressing changes and packing status post right and posterior tibial bypass in July with early occlusion and revision with known graft occlusion. The patient has been seen weekly for various concerns with her incisions including drainage and pain with her dressing changes and we were contacted today to evaluate the patient again. The patient's daughter states that her right groin wound as well as her lower extremity when is "nonhealing" and more than she can handle at home.  Past Medical History   Diagnosis  Date   .  Hyperlipidemia      takes Simvastatin daily   .  Peripheral vascular disease    .  Stroke      TIA history   .  PONV (postoperative nausea and vomiting)    .  Hypertension      takes Amlodipine/HCTZ/Losartan daily   .  Myocardial infarction  1990's   .  Headache      when b/p is elevated   .  Vertigo      takes Ativert prn   .  Hemorrhoids    .  Diabetes mellitus      takes Glucovance and Januvia daily   .  Anxiety      takes Xanax prn    Social History  History   Substance Use Topics   .  Smoking status:  Former Smoker -- 40 years     Types:  Cigarettes     Quit date:  02/27/2012   .  Smokeless tobacco:  Never Used   .  Alcohol Use:  No    Family History  Family History   Problem  Relation  Age of Onset   .  Cancer  Mother       breast    .  Diabetes  Father     ROS: [x]  Positive [ ]  Denies  General:[ ]  Weight loss, [ ]  Weight gain, [ ]  Fever, [ ]  chills  Neurologic: [ ]  Dizziness, [ ]  Blackouts, [ ]  Seizure  [ ]  Stroke, [ ]  "Mini stroke", [ ]  Slurred speech, [ ]  Temporary blindness; [  ]weakness,  [ ]  Hoarseness  Cardiac: [ ]  Chest pain/pressure, [ ]  Shortness of breath at rest [ ]  Shortness of breath with exertion, [ ]  Atrial fibrillation or irregular heartbeat  Vascular:[ x] Pain in legs with walking, [x]  Pain in legs at rest ,[ ]  Pain in legs at night,  [x ] Non-healing ulcer, [ ]  Blood clot in vein/DVT,  Pulmonary: [ ]  Home oxygen, [ ]  Productive cough, [ ]  Coughing up blood, [ ]  Asthma,  [ ]  Wheezing  Musculoskeletal: [ ]  Arthritis, [ ]  Low back pain, [ ]  Joint pain  Hematologic:[ ]  Easy Bruising, [ ]  Anemia; [ ]  Hepatitis  Gastrointestinal: [ ]  Blood in stool, [ ]  Gastroesophageal Reflux, [ ]  Trouble swallowing  Urinary: [ ]  chronic Kidney disease, [ ]  on HD, [ ]  Burning with urination, [ ]  Frequent urination, [ ]  Difficulty urinating;  Skin: [ ]  Rashes, [ ]  Wounds  Allergies   Allergen  Reactions   .  Codeine  Other (See Comments)     "  makes me feel strange"   .  Plavix (Clopidogrel Bisulfate)  Itching   .  Tylenol (Acetaminophen)  Other (See Comments)     "Doesn't feel right"   .  Tylenol With Codeine #3 (Acetaminophen-Codeine)  Other (See Comments)     unknown    Current Outpatient Prescriptions   Medication  Sig  Dispense  Refill   .  ALPRAZolam (XANAX) 0.25 MG tablet  Take 0.25 mg by mouth daily as needed. For anxiety     .  amLODipine (NORVASC) 10 MG tablet  Take 10 mg by mouth every morning.     Marland Kitchen  aspirin 81 MG chewable tablet  Chew 81 mg by mouth every other day.     .  glyBURIDE-metformin (GLUCOVANCE) 5-500 MG per tablet  Take 2 tablets by mouth 2 (two) times daily with a meal.     .  hydrochlorothiazide (HYDRODIURIL) 25 MG tablet  Take 25 mg by mouth every morning.     Marland Kitchen  HYDROcodone-acetaminophen (VICODIN) 5-500 MG per tablet  Take 1 tablet by mouth every 6 (six) hours as needed for pain.  20 tablet  0   .  ibuprofen (ADVIL,MOTRIN) 200 MG tablet  Take 400 mg by mouth every 8 (eight) hours as needed. For pain     .  losartan (COZAAR) 100 MG tablet   Take 100 mg by mouth every morning.     .  meclizine (ANTIVERT) 25 MG tablet  Take 25 mg by mouth daily as needed. For vertigo     .  Rivaroxaban (XARELTO) 20 MG TABS  Take 1 tablet (20 mg total) by mouth daily.  30 tablet  2   .  simvastatin (ZOCOR) 40 MG tablet  Take 40 mg by mouth at bedtime.     .  sitaGLIPtin (JANUVIA) 100 MG tablet  Take 100 mg by mouth every morning.      Physical Examination  Filed Vitals:    05/02/12 1522   BP:  121/78   Pulse:  124   Resp:  22    General: A&O x 3, WDWN,  Gait: limp  Eyes: PERRLA,  Pulmonary: CTAB, without wheezes , rales or rhonchi  Cardiac: regular Rythm , without murmur RIGHT LEFT  CAROTID BRUIT  Negative  Negative   VASCULAR EXAM:  Extremities with ischemic changes right second toe.  LOWER EXTREMITY PULSES RIGHT LEFT  FEMORAL  non-Dopplerable and palpable  weak and palpable   POPLITEAL  absent and palpable  absent and palpable   POSTERIOR TIBIAL  absent and palpable  weak and palpable   DORSALIS PEDIS  ANTERIOR TIBIAL  absent and palpable  weak and palpable   PERONEAL  not examined  not examined   Abdomen: soft, NT, no masses  Skin: no rashes, ulcers noted  Musculoskeletal: no muscle wasting or atrophy  Neurologic: A&O X 3; Appropriate Affect ; SENSATION: normal; MOTOR FUNCTION: moving all extremities equally. Speech is fluent/normal  Non-Invasive Vascular Imaging: DATE: 05/02/2012  No imaging today  Please see Epic records for most recent imaging  ASSESSMENT:  Jenna Brown is a 61 y.o. female who presents with incisional pain and drainage with nonhealing right lower extremity incisions that will require pain control beyond what she can obtain at home for complete and proper wound care.  PLAN:  Based on the patient's vascular studies and examination Dr. Scot Dock has recommended the patient be admitted for pain control with dressing changes, antibiotics and evaluation by Dr. Kellie Simmering who  is on call and will speak with Dr. [name] who is  the patient's surgeon to resume care tomorrow.  Judeth Horn ANP  Clinic MD: Scot Dock

## 2012-05-04 NOTE — Telephone Encounter (Addendum)
Message copied by Lujean Amel on Fri May 04, 2012  4:37 PM ------      Message from: Alfonso Patten      Created: Fri May 04, 2012  4:16 PM                   ----- Message -----         From: Richrd Prime, PA         Sent: 05/04/2012  12:33 PM           To: Alfonso Patten, RN            2 week F/U [name] - wound check  I scheduled an appt for the patient on 05/28/12 at 1:45pm. I left a voice mail message and mailed an appt letter.awt

## 2012-05-07 ENCOUNTER — Telehealth: Payer: Self-pay | Admitting: *Deleted

## 2012-05-07 ENCOUNTER — Ambulatory Visit: Payer: Medicaid Other | Admitting: Neurosurgery

## 2012-05-07 NOTE — Telephone Encounter (Signed)
[name] called re: Jenna Brown, her sister. [name] was d/c to [facility] over the weekend; She is very anxious and having some pain in her groin per [name]. I advised her to contact the supervising nurse in charge of Ms. [name] for eval of her needs. I made sure Ms Jenna Brown was aware of Dr. Stephens Shire next appt on 05-28-12. She will contact the appropriate people for transportation. She voiced understanding and is in agreement of this plan to address Ms. [name] anxiety and pain issues.

## 2012-05-09 ENCOUNTER — Encounter (HOSPITAL_COMMUNITY): Payer: Self-pay | Admitting: *Deleted

## 2012-05-09 ENCOUNTER — Inpatient Hospital Stay (HOSPITAL_COMMUNITY)
Admission: EM | Admit: 2012-05-09 | Discharge: 2012-05-14 | DRG: 240 | Disposition: A | Discharge status: Skilled Nursing Facility | Payer: Medicaid Other | Attending: Surgery | Admitting: Surgery

## 2012-05-09 DIAGNOSIS — I252 Old myocardial infarction: Secondary | ICD-10-CM

## 2012-05-09 DIAGNOSIS — Z87891 Personal history of nicotine dependence: Secondary | ICD-10-CM

## 2012-05-09 DIAGNOSIS — Z888 Allergy status to other drugs, medicaments and biological substances status: Secondary | ICD-10-CM

## 2012-05-09 DIAGNOSIS — D649 Anemia, unspecified: Secondary | ICD-10-CM | POA: Diagnosis present

## 2012-05-09 DIAGNOSIS — Y92009 Unspecified place in unspecified non-institutional (private) residence as the place of occurrence of the external cause: Secondary | ICD-10-CM

## 2012-05-09 DIAGNOSIS — Z7982 Long term (current) use of aspirin: Secondary | ICD-10-CM

## 2012-05-09 DIAGNOSIS — E119 Type 2 diabetes mellitus without complications: Secondary | ICD-10-CM | POA: Diagnosis present

## 2012-05-09 DIAGNOSIS — E785 Hyperlipidemia, unspecified: Secondary | ICD-10-CM | POA: Diagnosis present

## 2012-05-09 DIAGNOSIS — M129 Arthropathy, unspecified: Secondary | ICD-10-CM | POA: Diagnosis present

## 2012-05-09 DIAGNOSIS — D72829 Elevated white blood cell count, unspecified: Secondary | ICD-10-CM | POA: Diagnosis present

## 2012-05-09 DIAGNOSIS — Z885 Allergy status to narcotic agent status: Secondary | ICD-10-CM

## 2012-05-09 DIAGNOSIS — I7092 Chronic total occlusion of artery of the extremities: Secondary | ICD-10-CM | POA: Diagnosis present

## 2012-05-09 DIAGNOSIS — I70229 Atherosclerosis of native arteries of extremities with rest pain, unspecified extremity: Secondary | ICD-10-CM

## 2012-05-09 DIAGNOSIS — E876 Hypokalemia: Secondary | ICD-10-CM

## 2012-05-09 DIAGNOSIS — Z8719 Personal history of other diseases of the digestive system: Secondary | ICD-10-CM

## 2012-05-09 DIAGNOSIS — Z9089 Acquired absence of other organs: Secondary | ICD-10-CM

## 2012-05-09 DIAGNOSIS — I10 Essential (primary) hypertension: Secondary | ICD-10-CM | POA: Diagnosis present

## 2012-05-09 DIAGNOSIS — I70309 Unspecified atherosclerosis of unspecified type of bypass graft(s) of the extremities, unspecified extremity: Secondary | ICD-10-CM | POA: Diagnosis present

## 2012-05-09 DIAGNOSIS — Y832 Surgical operation with anastomosis, bypass or graft as the cause of abnormal reaction of the patient, or of later complication, without mention of misadventure at the time of the procedure: Secondary | ICD-10-CM | POA: Diagnosis present

## 2012-05-09 DIAGNOSIS — K449 Diaphragmatic hernia without obstruction or gangrene: Secondary | ICD-10-CM | POA: Diagnosis present

## 2012-05-09 DIAGNOSIS — F419 Anxiety disorder, unspecified: Secondary | ICD-10-CM | POA: Diagnosis present

## 2012-05-09 DIAGNOSIS — L97909 Non-pressure chronic ulcer of unspecified part of unspecified lower leg with unspecified severity: Secondary | ICD-10-CM | POA: Diagnosis present

## 2012-05-09 DIAGNOSIS — Z8673 Personal history of transient ischemic attack (TIA), and cerebral infarction without residual deficits: Secondary | ICD-10-CM

## 2012-05-09 DIAGNOSIS — D500 Iron deficiency anemia secondary to blood loss (chronic): Secondary | ICD-10-CM | POA: Diagnosis present

## 2012-05-09 DIAGNOSIS — I639 Cerebral infarction, unspecified: Secondary | ICD-10-CM

## 2012-05-09 DIAGNOSIS — F411 Generalized anxiety disorder: Secondary | ICD-10-CM

## 2012-05-09 DIAGNOSIS — R000 Tachycardia, unspecified: Secondary | ICD-10-CM | POA: Diagnosis present

## 2012-05-09 DIAGNOSIS — I6350 Cerebral infarction due to unspecified occlusion or stenosis of unspecified cerebral artery: Secondary | ICD-10-CM

## 2012-05-09 DIAGNOSIS — L98499 Non-pressure chronic ulcer of skin of other sites with unspecified severity: Principal | ICD-10-CM | POA: Diagnosis present

## 2012-05-09 DIAGNOSIS — T8130XA Disruption of wound, unspecified, initial encounter: Secondary | ICD-10-CM | POA: Diagnosis present

## 2012-05-09 DIAGNOSIS — K219 Gastro-esophageal reflux disease without esophagitis: Secondary | ICD-10-CM | POA: Diagnosis present

## 2012-05-09 DIAGNOSIS — I739 Peripheral vascular disease, unspecified: Principal | ICD-10-CM | POA: Diagnosis present

## 2012-05-09 HISTORY — PX: FEMOROPOPLITEAL THROMBECTOMY / EMBOLECTOMY: SUR432

## 2012-05-09 LAB — CBC
HCT: 29.6 % — ABNORMAL LOW (ref 36.0–46.0)
MCH: 25.2 pg — ABNORMAL LOW (ref 26.0–34.0)
MCHC: 31.8 g/dL (ref 30.0–36.0)
MCV: 79.4 fL (ref 78.0–100.0)
Platelets: 491 10*3/uL — ABNORMAL HIGH (ref 150–400)
RDW: 13.4 % (ref 11.5–15.5)

## 2012-05-09 LAB — COMPREHENSIVE METABOLIC PANEL
AST: 9 U/L (ref 0–37)
Albumin: 2.8 g/dL — ABNORMAL LOW (ref 3.5–5.2)
BUN: 18 mg/dL (ref 6–23)
Calcium: 9.8 mg/dL (ref 8.4–10.5)
Chloride: 99 mEq/L (ref 96–112)
Creatinine, Ser: 1.36 mg/dL — ABNORMAL HIGH (ref 0.50–1.10)
Total Bilirubin: 0.2 mg/dL — ABNORMAL LOW (ref 0.3–1.2)

## 2012-05-09 LAB — GLUCOSE, CAPILLARY
Glucose-Capillary: 64 mg/dL — ABNORMAL LOW (ref 70–99)
Glucose-Capillary: 77 mg/dL (ref 70–99)

## 2012-05-09 LAB — PROTIME-INR
INR: 1.57 — ABNORMAL HIGH (ref 0.00–1.49)
Prothrombin Time: 19.1 seconds — ABNORMAL HIGH (ref 11.6–15.2)

## 2012-05-09 MED ORDER — PANTOPRAZOLE SODIUM 40 MG PO TBEC
40.0000 mg | DELAYED_RELEASE_TABLET | Freq: Every day | ORAL | Status: DC
  Administered 2012-05-10 – 2012-05-14 (×5): 40 mg via ORAL
  Filled 2012-05-09 (×5): qty 1

## 2012-05-09 MED ORDER — LINAGLIPTIN 5 MG PO TABS
5.0000 mg | ORAL_TABLET | Freq: Every day | ORAL | Status: DC
  Administered 2012-05-11 – 2012-05-14 (×4): 5 mg via ORAL
  Filled 2012-05-09 (×5): qty 1

## 2012-05-09 MED ORDER — SODIUM CHLORIDE 0.9 % IV SOLN: 250.0000 mL | INTRAVENOUS | Status: DC | PRN

## 2012-05-09 MED ORDER — GLYBURIDE-METFORMIN 5-500 MG PO TABS: 1.0000 | ORAL_TABLET | Freq: Two times a day (BID) | ORAL | Status: DC

## 2012-05-09 MED ORDER — HYDROCHLOROTHIAZIDE 25 MG PO TABS
25.0000 mg | ORAL_TABLET | Freq: Every morning | ORAL | Status: DC
  Administered 2012-05-10 – 2012-05-14 (×5): 25 mg via ORAL
  Filled 2012-05-09 (×6): qty 1

## 2012-05-09 MED ORDER — SODIUM CHLORIDE 0.9 % IJ SOLN
3.0000 mL | Freq: Two times a day (BID) | INTRAMUSCULAR | Status: DC
  Administered 2012-05-09 – 2012-05-10 (×2): 3 mL via INTRAVENOUS

## 2012-05-09 MED ORDER — LOSARTAN POTASSIUM 50 MG PO TABS: 100.0000 mg | ORAL_TABLET | Freq: Every morning | ORAL | Status: DC

## 2012-05-09 MED ORDER — GLYBURIDE 5 MG PO TABS
5.0000 mg | ORAL_TABLET | Freq: Two times a day (BID) | ORAL | Status: DC
  Administered 2012-05-09 – 2012-05-14 (×9): 5 mg via ORAL
  Filled 2012-05-09 (×13): qty 1

## 2012-05-09 MED ORDER — MECLIZINE HCL 25 MG PO TABS
25.0000 mg | ORAL_TABLET | Freq: Every day | ORAL | Status: DC | PRN
  Filled 2012-05-09: qty 1

## 2012-05-09 MED ORDER — GUAIFENESIN-DM 100-10 MG/5ML PO SYRP: 15.0000 mL | ORAL_SOLUTION | ORAL | Status: DC | PRN

## 2012-05-09 MED ORDER — LEVOFLOXACIN 500 MG PO TABS
500.0000 mg | ORAL_TABLET | Freq: Every day | ORAL | Status: DC
  Administered 2012-05-10 – 2012-05-14 (×5): 500 mg via ORAL
  Filled 2012-05-09 (×5): qty 1

## 2012-05-09 MED ORDER — INSULIN ASPART 100 UNIT/ML ~~LOC~~ SOLN: 0.0000 [IU] | Freq: Three times a day (TID) | SUBCUTANEOUS | Status: DC

## 2012-05-09 MED ORDER — SENNA 8.6 MG PO TABS
1.0000 | ORAL_TABLET | Freq: Two times a day (BID) | ORAL | Status: DC
  Administered 2012-05-09 – 2012-05-13 (×8): 8.6 mg via ORAL
  Filled 2012-05-09 (×12): qty 1

## 2012-05-09 MED ORDER — POTASSIUM CHLORIDE CRYS ER 20 MEQ PO TBCR
20.0000 meq | EXTENDED_RELEASE_TABLET | Freq: Once | ORAL | Status: AC
  Administered 2012-05-09: 40 meq via ORAL
  Filled 2012-05-09: qty 2

## 2012-05-09 MED ORDER — HYDROCODONE-ACETAMINOPHEN 5-325 MG PO TABS
1.0000 | ORAL_TABLET | ORAL | Status: DC | PRN
  Administered 2012-05-09 – 2012-05-14 (×8): 2 via ORAL
  Filled 2012-05-09 (×8): qty 2

## 2012-05-09 MED ORDER — HYDRALAZINE HCL 20 MG/ML IJ SOLN
10.0000 mg | INTRAMUSCULAR | Status: DC | PRN
  Filled 2012-05-09: qty 0.5

## 2012-05-09 MED ORDER — BISACODYL 10 MG RE SUPP: 10.0000 mg | Freq: Every day | RECTAL | Status: DC | PRN

## 2012-05-09 MED ORDER — COLLAGENASE 250 UNIT/GM EX OINT
TOPICAL_OINTMENT | Freq: Every day | CUTANEOUS | Status: DC
  Administered 2012-05-10: 11:00:00 via TOPICAL
  Filled 2012-05-09: qty 30

## 2012-05-09 MED ORDER — METFORMIN HCL 500 MG PO TABS
500.0000 mg | ORAL_TABLET | Freq: Two times a day (BID) | ORAL | Status: DC
  Administered 2012-05-09: 500 mg via ORAL
  Filled 2012-05-09 (×2): qty 1

## 2012-05-09 MED ORDER — ACETAMINOPHEN 325 MG PO TABS: 325.0000 mg | ORAL_TABLET | ORAL | Status: DC | PRN

## 2012-05-09 MED ORDER — ALPRAZOLAM 0.25 MG PO TABS
0.2500 mg | ORAL_TABLET | Freq: Every day | ORAL | Status: DC | PRN
  Administered 2012-05-10 – 2012-05-14 (×3): 0.25 mg via ORAL
  Filled 2012-05-09 (×3): qty 1

## 2012-05-09 MED ORDER — LABETALOL HCL 5 MG/ML IV SOLN
10.0000 mg | INTRAVENOUS | Status: DC | PRN
  Filled 2012-05-09: qty 4

## 2012-05-09 MED ORDER — ONDANSETRON HCL 4 MG/2ML IJ SOLN: 4.0000 mg | Freq: Four times a day (QID) | INTRAMUSCULAR | Status: DC | PRN

## 2012-05-09 MED ORDER — SIMVASTATIN 40 MG PO TABS
40.0000 mg | ORAL_TABLET | Freq: Every day | ORAL | Status: DC
  Administered 2012-05-10 – 2012-05-11 (×2): 40 mg via ORAL
  Filled 2012-05-09 (×2): qty 1

## 2012-05-09 MED ORDER — PHENOL 1.4 % MT LIQD
1.0000 | OROMUCOSAL | Status: DC | PRN
  Filled 2012-05-09: qty 177

## 2012-05-09 MED ORDER — INSULIN ASPART 100 UNIT/ML ~~LOC~~ SOLN
0.0000 [IU] | Freq: Three times a day (TID) | SUBCUTANEOUS | Status: DC
  Administered 2012-05-11: 2 [IU] via SUBCUTANEOUS
  Administered 2012-05-11: 3 [IU] via SUBCUTANEOUS
  Administered 2012-05-11: 5 [IU] via SUBCUTANEOUS
  Administered 2012-05-12 (×2): 3 [IU] via SUBCUTANEOUS
  Administered 2012-05-13 (×2): 5 [IU] via SUBCUTANEOUS
  Administered 2012-05-14: 3 [IU] via SUBCUTANEOUS

## 2012-05-09 MED ORDER — POLYETHYLENE GLYCOL 3350 17 G PO PACK
17.0000 g | PACK | Freq: Every day | ORAL | Status: DC | PRN
  Filled 2012-05-09: qty 1

## 2012-05-09 MED ORDER — AMLODIPINE BESYLATE 10 MG PO TABS
10.0000 mg | ORAL_TABLET | Freq: Every morning | ORAL | Status: DC
  Administered 2012-05-10 – 2012-05-14 (×5): 10 mg via ORAL
  Filled 2012-05-09 (×5): qty 1

## 2012-05-09 MED ORDER — SODIUM CHLORIDE 0.9 % IJ SOLN: 3.0000 mL | INTRAMUSCULAR | Status: DC | PRN

## 2012-05-09 MED ORDER — ACETAMINOPHEN 325 MG RE SUPP
325.0000 mg | RECTAL | Status: DC | PRN
  Filled 2012-05-09: qty 2

## 2012-05-09 MED ORDER — HYDROMORPHONE HCL PF 1 MG/ML IJ SOLN: 0.5000 mg | INTRAMUSCULAR | Status: DC | PRN

## 2012-05-09 MED ORDER — METOPROLOL TARTRATE 1 MG/ML IV SOLN
2.0000 mg | INTRAVENOUS | Status: DC | PRN
  Administered 2012-05-12: 5 mg via INTRAVENOUS
  Filled 2012-05-09: qty 5

## 2012-05-09 NOTE — Progress Notes (Signed)
Hypoglycemic Event  CBG: 64  Treatment: 15 GM carbohydrate snack  Symptoms: None  Follow-up CBG: Time:2130 CBG Result:77  Possible Reasons for Event: Inadequate meal intake  Comments/MD notified: Pt Blood sugar up to 77, will continue to monitor.    Doreatha Lew  Remember to initiate Hypoglycemia Order Set & complete

## 2012-05-09 NOTE — H&P (Signed)
[facility] [place]  HISTORY AND PHYSICAL -PAD  CC: Nonhealing right lower extremity surgical incisions And ischemic right leg   History of Present Illness  Jenna Brown is a 61 y.o. female patient who presents with chief complaint of right lower extremity pain with incision dressing changes and packing status post right and posterior tibial bypass in July with early occlusion and revision with known graft occlusion. Pt was recently admitted for wound care, Antibiotics and pain control. She went to SNF last Friday.  She was sent back secondary to intractable pain in the right leg and worsening of wounds. Pt states she has pain at night and at rest in the right lower leg not just at incisions. Denies numbness and tingling  Past Medical History   Diagnosis  Date   .  Hyperlipidemia      takes Simvastatin daily   .  Peripheral vascular disease    .  Stroke      TIA history   .  PONV (postoperative nausea and vomiting)    .  Hypertension      takes Amlodipine/HCTZ/Losartan daily   .  Myocardial infarction  1990's   .  Headache      when b/p is elevated   .  Vertigo      takes Ativert prn   .  Hemorrhoids    .  Diabetes mellitus      takes Glucovance and Januvia daily   .  Anxiety      takes Xanax prn   Social History  History   Substance Use Topics   .  Smoking status:  Former Smoker -- 40 years     Types:  Cigarettes     Quit date:  02/27/2012   .  Smokeless tobacco:  Never Used   .  Alcohol Use:  No   Family History  Family History   Problem  Relation  Age of Onset   .  Cancer  Mother       breast    .  Diabetes  Father    ROS: [x]  Positive [ ]  Denies  General:[ ]  Weight loss, [ ]  Weight gain, [ ]  Fever, [ ]  chills  Neurologic: [ ]  Dizziness, [ ]  Blackouts, [ ]  Seizure  [ ]  Stroke, [ ]  "Mini stroke", [ ]  Slurred speech, [ ]  Temporary blindness; [ ] weakness,  [ ]  Hoarseness  Cardiac: [ ]  Chest pain/pressure, [ ]  Shortness of breath at rest [ ]   Shortness of breath with exertion, [ ]  Atrial fibrillation or irregular heartbeat  Vascular:[ x] Pain in legs with walking, [x]  Pain in legs at rest ,[ ]  Pain in legs at night,  [x ] Non-healing ulcer, [ ]  Blood clot in vein/DVT,  Pulmonary: [ ]  Home oxygen, [ ]  Productive cough, [ ]  Coughing up blood, [ ]  Asthma,  [ ]  Wheezing  Musculoskeletal: [ ]  Arthritis, [ ]  Low back pain, [ ]  Joint pain  Hematologic:[ ]  Easy Bruising, [ ]  Anemia; [ ]  Hepatitis  Gastrointestinal: [ ]  Blood in stool, [ ]  Gastroesophageal Reflux, [ ]  Trouble swallowing  Urinary: [ ]  chronic Kidney disease, [ ]  on HD, [ ]  Burning with urination, [ ]  Frequent urination, [ ]  Difficulty urinating;  Skin: [ ]  Rashes, [ ]  Wounds  Allergies   Allergen  Reactions   .  Codeine  Other (See Comments)     "makes me feel strange"   .  Plavix (Clopidogrel Bisulfate)  Itching   .  Tylenol (Acetaminophen)  Other (See Comments)     "Doesn't feel right"   .  Tylenol With Codeine #3 (Acetaminophen-Codeine)  Other (See Comments)     unknown    Current Outpatient Prescriptions   Medication  Sig  Dispense  Refill   .  ALPRAZolam (XANAX) 0.25 MG tablet  Take 0.25 mg by mouth daily as needed. For anxiety     .  amLODipine (NORVASC) 10 MG tablet  Take 10 mg by mouth every morning.     Marland Kitchen  aspirin 81 MG chewable tablet  Chew 81 mg by mouth every other day.     .  glyBURIDE-metformin (GLUCOVANCE) 5-500 MG per tablet  Take 2 tablets by mouth 2 (two) times daily with a meal.     .  hydrochlorothiazide (HYDRODIURIL) 25 MG tablet  Take 25 mg by mouth every morning.     Marland Kitchen  HYDROcodone-acetaminophen (VICODIN) 5-500 MG per tablet  Take 1 tablet by mouth every 6 (six) hours as needed for pain.  20 tablet  0   .  ibuprofen (ADVIL,MOTRIN) 200 MG tablet  Take 400 mg by mouth every 8 (eight) hours as needed. For pain     .  losartan (COZAAR) 100 MG tablet  Take 100 mg by mouth every morning.     .  meclizine (ANTIVERT) 25 MG tablet  Take 25 mg by mouth  daily as needed. For vertigo     .  Rivaroxaban (XARELTO) 20 MG TABS  Take 1 tablet (20 mg total) by mouth daily.  30 tablet  2   .  simvastatin (ZOCOR) 40 MG tablet  Take 40 mg by mouth at bedtime.     .  sitaGLIPtin (JANUVIA) 100 MG tablet  Take 100 mg by mouth every morning.     Physical Examination   General: A&O x 3, WDWN,  Gait: bedrest Eyes: PERRLA,  Pulmonary: CTAB, without wheezes , rales or rhonchi  Cardiac: regular Rythm , without murmur RIGHT LEFT  CAROTID BRUIT  Negative  Negative    VASCULAR EXAM:  Extremities with ischemic changes right second toe.  RLE wounds - groin clean and healing Right calf wound clean Large eschar medial aspect of ankle Right leg and foot warm Musculoskeletal: no muscle wasting or atrophy    LOWER EXTREMITY PULSES        RIGHT                FEMORAL   palpable    POSTERIOR TIBIAL  Monophasic per doppler   DORSALIS PEDIS  ANTERIOR TIBIAL  absent      PERONEAL  absent     Abdomen: soft, NT, no masses  Skin: no rashes, Neurologic: A&O X 3; Appropriate Affect ; SENSATION: normal; MOTOR FUNCTION: moving all extremities equally. Speech is fluent/normal    ASSESSMENT:  Jenna Brown is a 61 y.o. female who presents with incisional pain and drainage with nonhealing right lower extremity incisions  And intractable pain in the right LE. Pt now has night pain and rest pain. Date of Surgery: 04/05/12  1.) thrombectomy and revision right femoral posterior tibial bypass graft  2.) Redo right common femoral and posterior tibial artery exposure  3.) Patch angioplasty right posterior tibial artery bypass graft with bovine patch  PLAN: admit for pain control and antibiotics and wound care NPO after midnight. Pt may now be to the point of needing amputation Dr. [name] to assess in am.

## 2012-05-09 NOTE — ED Notes (Signed)
Patient with faint palpable dp pulse, patient right lower extremity warm to touch.

## 2012-05-09 NOTE — ED Notes (Signed)
Patient sent from nursing facility due to pain in right leg and abnormal drainage from surgical site

## 2012-05-09 NOTE — H&P (Deleted)
[facility] Medical Consultation  Jenna Brown [id number] DOB: September 14, 1951 DOA: 05/09/2012 PCP: [name], MD   Requesting physician: Dr. [name] Date of consultation: 05/09/12 Reason for consultation: Medical management of current medical conditions  Impression/Recommendations Active Problems: HTN DM Hyperlipidemia H/o prior stroke Chronic total occlusion of artery of the extremities Wound dehiscence  Anxiety   1. DM:  Patient is on glyburide and metformin.  Given her elevated creatinine of 1.36 (near to contraindication of 1.4) I will plan on holding her metformin.  Will plan on placing on SSI with diabetic diet patient will be npo after midnight excepts sips with medication.  Also will monitor blood sugars fasting, Qac and Qhs.  Unless her creatinine decreases significantly patient may have to be discharged off of her metformin 2. HTN: Will plan on holding patient's cozaar.  Will continue however her HCTZ, amlodipine.  Monitor blood pressures and adjust medication pending readings. 3. HPL:  Given history of PVD, HPL, and Stroke would plan on continuing patient's zocor 4. H/o Prior stroke: Continue zocor and blood pressure control.   5. Anxiety: Stable will continue xanax PRN. 6. RLE pain with total occlusion of artery of extremities/wound dehiscence: Vascular surgery on board and managing. Will defer antibiotic regimen, anticoagulation, and pain regimen to them.  I will followup again tomorrow. Please contact me if I can be of assistance in the meanwhile. Thank you for this consultation.  Chief Complaint: RLE pain  HPI:  Patient is a 61 y/o with h/o stroke, HPL, and PVD s/p thrombectomy and revision of right femoral posterior tibial bypass graft, redo right common femoral and posterior tibial artery exposure, and patch angioplasty right posterior tibial artery bypass graft on 04/05/12.  She presented to the ED from her SNF c/o RLE discomfort and nonhealing  wounds.  Vascular surgery was called for evaluation in the ED and we were consulted for medical management of her medical comorbidites.   At this point patient has been admitted for pain control, antibiotics, and wound care.    We will manage patient's medical problems while in house.  Her blood sugars have ranged from 101-202 while in house.  Creatinine at 1.36.  Review of Systems:  Pt denies any hemoptysis, nausea, emesis, abd discomfort, ha's, fever, chills, BRBPR, hematuria, diaphoresis, active bleeding, SI, chest discomfort, cough, SOB, focal neurological deficits, blurred vision.  Past Medical History  Diagnosis Date  . Hyperlipidemia     takes Simvastatin daily  . Peripheral vascular disease   . Stroke     TIA history  . PONV (postoperative nausea and vomiting)   . Hypertension     takes Amlodipine/HCTZ/Losartan daily  . Vertigo     takes Ativert prn  . Hemorrhoids   . Anxiety     takes Xanax prn  . Myocardial infarction 1990's  . Type II diabetes mellitus     takes Glucovance and Januvia daily  . H/O hiatal hernia   . GERD (gastroesophageal reflux disease)   . Headache     when b/p is elevated  . Migraines   . Arthritis    Past Surgical History  Procedure Date  . Cleft palate repair     several  . Pr vein bypass graft,aorto-fem-pop 05/27/11    Right SFA-Below knee Pop BP  . Tonsillectomy   . Dilation and curettage of uterus   . Aortogram 02/2012  . Colonoscopy   . Femoral-tibial bypass graft 03/29/2012    Procedure: BYPASS GRAFT FEMORAL-TIBIAL ARTERY;  Surgeon: [name], MD;  Location: [facility] OR;  Service: Vascular;  Laterality: Right;  Right femoral to Posterior Tibialis with composite graft of 51mm x 80 cm ringed gortex graft and saphenous vein  ,intraoperative arteriogram  . Femoral-popliteal bypass graft 04/05/2012    Procedure: BYPASS GRAFT FEMORAL-POPLITEAL ARTERY;  Surgeon: [name], MD;  Location: [facility];  Service: Vascular;  Laterality: Right;   REVISION  . Myomectomy   . Tubal ligation ~ 1990   Social History:  reports that she quit smoking about 2 months ago. Her smoking use included Cigarettes. She has a 10 pack-year smoking history. She has never used smokeless tobacco. She reports that she does not drink alcohol or use illicit drugs.  Allergies  Allergen Reactions  . Plavix (Clopidogrel Bisulfate) Itching  . Codeine Other (See Comments)    "makes me feel strange"  . Tylenol (Acetaminophen) Other (See Comments)    "Doesn't feel right"   . Tylenol With Codeine #3 (Acetaminophen-Codeine) Other (See Comments)    "doesn't make me feel right"   Family History  Problem Relation Age of Onset  . Cancer Mother     breast  . Diabetes Father     Prior to Admission medications   Medication Sig Start Date End Date Taking? Authorizing Provider  ALPRAZolam (XANAX) 0.25 MG tablet Take 0.25 mg by mouth daily as needed. For anxiety   Yes Historical Provider, MD  amLODipine (NORVASC) 10 MG tablet Take 10 mg by mouth every morning.  01/30/12  Yes Historical Provider, MD  collagenase (SANTYL) ointment Apply topically daily. To right lower leg wound 05/04/12 05/14/12 Yes Regina J Capac, PA  glyBURIDE-metformin (GLUCOVANCE) 5-500 MG per tablet Take 1 tablet by mouth 2 (two) times daily with a meal.    Yes Historical Provider, MD  hydrochlorothiazide (HYDRODIURIL) 25 MG tablet Take 25 mg by mouth every morning.  02/09/12  Yes Historical Provider, MD  HYDROcodone-acetaminophen (VICODIN) 5-500 MG per tablet Take 1 tablet by mouth every 6 (six) hours as needed for pain. 05/04/12 05/14/12 Yes Regina J Roczniak, PA  levofloxacin (LEVAQUIN) 500 MG tablet Take 1 tablet (500 mg total) by mouth daily. 05/04/12 05/14/12 Yes Regina J Roczniak, PA  losartan (COZAAR) 100 MG tablet Take 100 mg by mouth every morning.  01/30/12  Yes Historical Provider, MD  meclizine (ANTIVERT) 25 MG tablet Take 25 mg by mouth daily as needed. For vertigo   Yes Historical Provider,  MD  Rivaroxaban (XARELTO) 20 MG TABS Take 20 mg by mouth every evening. At 4pm. 04/08/12  Yes Ulyses Amor, PA  simvastatin (ZOCOR) 40 MG tablet Take 40 mg by mouth every morning.    Yes Historical Provider, MD  sitaGLIPtin (JANUVIA) 100 MG tablet Take 100 mg by mouth every morning.   Yes Historical Provider, MD   Physical Exam: Blood pressure 133/79, pulse 108, temperature 97.5 F (36.4 C), temperature source Oral, resp. rate 20, SpO2 99.00%. Filed Vitals:   05/09/12 1345 05/09/12 1445 05/09/12 1504 05/09/12 1610  BP: 105/50 94/69 94/50  133/79  Pulse: 103 99 96 108  Temp:    97.5 F (36.4 C)  TempSrc:    Oral  Resp: 30 20 29 20   SpO2: 97% 97% 99% 99%     General:  Pt in NAD, laying in bed supine  Eyes: EOMI, PERRLA  ENT: no masses on visual inspection  Neck: no masses or goiter  Cardiovascular: RRR, No MRG  Respiratory: CTA BL, no wheezes  Abdomen: Soft,  NT, ND  Skin: no diaphoresis, rashes  Musculoskeletal: Pain localized to RLE with movement  Psychiatric: Mood and affect appropriate  Neurologic: answers questions appropriately and moves all extremities.  Labs on Admission:  Basic Metabolic Panel:  Lab [id number] 05/04/12 0617  NA 142 138  K 3.2* 4.3  CL 99 99  CO2 29 25  GLUCOSE 86 193*  BUN 18 27*  CREATININE 1.36* 1.05  CALCIUM 9.8 9.5  MG -- --  PHOS -- --   Liver Function Tests:  Lab 05/09/12 1609  AST 9  ALT 6  ALKPHOS 96  BILITOT 0.2*  PROT 7.5  ALBUMIN 2.8*   No results found for this basename: LIPASE:5,AMYLASE:5 in the last 168 hours No results found for this basename: AMMONIA:5 in the last 168 hours CBC:  Lab 05/09/12 1609 05/04/12 0617  WBC 16.9* 20.1*  NEUTROABS -- --  HGB 9.4* 8.8*  HCT 29.6* 27.5*  MCV 79.4 80.6  PLT 491* 411*   Cardiac Enzymes: No results found for this basename: CKTOTAL:5,CKMB:5,CKMBINDEX:5,TROPONINI:5 in the last 168 hours BNP: No components found with this basename: POCBNP:5 CBG:  Lab  05/09/12 1637 05/04/12 1201 05/04/12 0630 05/03/12 2222 05/03/12 1631  GLUCAP 101* 202* 174* 141* 173*    Radiological Exams on Admission: No results found.  EKG: Independently reviewed. Ordered and Pending  Time spent: > 45 minutes  Velvet Bathe [facility] Pager [phone]  If 7PM-7AM, please contact night-coverage [email or web address] Password [email or web address] 05/09/2012, 6:25 PM

## 2012-05-10 ENCOUNTER — Inpatient Hospital Stay (HOSPITAL_COMMUNITY): Payer: Medicaid Other | Admitting: Certified Registered Nurse Anesthetist

## 2012-05-10 ENCOUNTER — Encounter (HOSPITAL_COMMUNITY)
Admission: EM | Disposition: A | Discharge status: Skilled Nursing Facility | Payer: Self-pay | Source: Home / Self Care | Attending: Surgery

## 2012-05-10 ENCOUNTER — Encounter (HOSPITAL_COMMUNITY): Payer: Self-pay | Admitting: Certified Registered Nurse Anesthetist

## 2012-05-10 ENCOUNTER — Inpatient Hospital Stay (HOSPITAL_COMMUNITY): Payer: Medicaid Other

## 2012-05-10 DIAGNOSIS — I70229 Atherosclerosis of native arteries of extremities with rest pain, unspecified extremity: Secondary | ICD-10-CM

## 2012-05-10 DIAGNOSIS — I70269 Atherosclerosis of native arteries of extremities with gangrene, unspecified extremity: Secondary | ICD-10-CM

## 2012-05-10 DIAGNOSIS — E876 Hypokalemia: Secondary | ICD-10-CM

## 2012-05-10 DIAGNOSIS — D649 Anemia, unspecified: Secondary | ICD-10-CM | POA: Diagnosis present

## 2012-05-10 HISTORY — PX: AMPUTATION: SHX166

## 2012-05-10 LAB — URINALYSIS, ROUTINE W REFLEX MICROSCOPIC
Bilirubin Urine: NEGATIVE
Ketones, ur: NEGATIVE mg/dL
Leukocytes, UA: NEGATIVE
Nitrite: NEGATIVE
Specific Gravity, Urine: 1.019 (ref 1.005–1.030)
Urobilinogen, UA: 0.2 mg/dL (ref 0.0–1.0)

## 2012-05-10 LAB — CREATININE, SERUM
GFR calc Af Amer: 68 mL/min — ABNORMAL LOW (ref >90)
GFR calc non Af Amer: 59 mL/min — ABNORMAL LOW (ref >90)

## 2012-05-10 LAB — HEMOGLOBIN A1C: Mean Plasma Glucose: 166 mg/dL — ABNORMAL HIGH (ref <117)

## 2012-05-10 LAB — CBC
Platelets: 470 10*3/uL — ABNORMAL HIGH (ref 150–400)
RDW: 13.4 % (ref 11.5–15.5)
WBC: 18.3 10*3/uL — ABNORMAL HIGH (ref 4.0–10.5)

## 2012-05-10 LAB — GLUCOSE, CAPILLARY
Glucose-Capillary: 99 mg/dL (ref 70–99)
Glucose-Capillary: 160 mg/dL — ABNORMAL HIGH (ref 70–99)
Glucose-Capillary: 65 mg/dL — ABNORMAL LOW (ref 70–99)

## 2012-05-10 LAB — POCT I-STAT 7, (LYTES, BLD GAS, ICA,H+H)
Bicarbonate: 27.2 mEq/L — ABNORMAL HIGH (ref 20.0–24.0)
HCT: 25 % — ABNORMAL LOW (ref 36.0–46.0)
Hemoglobin: 8.5 g/dL — ABNORMAL LOW (ref 12.0–15.0)
pH, Arterial: 7.603 (ref 7.350–7.450)
pO2, Arterial: 359 mmHg — ABNORMAL HIGH (ref 80.0–100.0)

## 2012-05-10 SURGERY — AMPUTATION, ABOVE KNEE
Anesthesia: General | Site: Leg Lower | Laterality: Right | Wound class: Contaminated

## 2012-05-10 MED ORDER — NEOSTIGMINE METHYLSULFATE 1 MG/ML IJ SOLN
INTRAMUSCULAR | Status: DC | PRN
  Administered 2012-05-10: 3 mg via INTRAVENOUS

## 2012-05-10 MED ORDER — ONDANSETRON HCL 4 MG/2ML IJ SOLN: 4.0000 mg | Freq: Once | INTRAMUSCULAR | Status: DC | PRN

## 2012-05-10 MED ORDER — DOCUSATE SODIUM 100 MG PO CAPS
100.0000 mg | ORAL_CAPSULE | Freq: Every day | ORAL | Status: DC
  Administered 2012-05-11 – 2012-05-13 (×3): 100 mg via ORAL
  Filled 2012-05-10 (×4): qty 1

## 2012-05-10 MED ORDER — ONDANSETRON HCL 4 MG/2ML IJ SOLN
INTRAMUSCULAR | Status: DC | PRN
  Administered 2012-05-10: 4 mg via INTRAVENOUS

## 2012-05-10 MED ORDER — DEXTROSE 50 % IV SOLN
12.5000 g | Freq: Once | INTRAVENOUS | Status: AC
  Administered 2012-05-10: 12.5 g via INTRAVENOUS

## 2012-05-10 MED ORDER — HYDROMORPHONE HCL PF 1 MG/ML IJ SOLN
0.2500 mg | INTRAMUSCULAR | Status: DC | PRN
  Administered 2012-05-10 (×3): 0.5 mg via INTRAVENOUS

## 2012-05-10 MED ORDER — FENTANYL CITRATE 0.05 MG/ML IJ SOLN
INTRAMUSCULAR | Status: DC | PRN
  Administered 2012-05-10: 100 ug via INTRAVENOUS
  Administered 2012-05-10: 150 ug via INTRAVENOUS

## 2012-05-10 MED ORDER — OXYCODONE-ACETAMINOPHEN 5-325 MG PO TABS
1.0000 | ORAL_TABLET | ORAL | Status: DC | PRN
  Administered 2012-05-11 – 2012-05-14 (×8): 2 via ORAL
  Filled 2012-05-10 (×8): qty 2

## 2012-05-10 MED ORDER — COLLAGENASE 250 UNIT/GM EX OINT
TOPICAL_OINTMENT | Freq: Every day | CUTANEOUS | Status: DC
  Administered 2012-05-11 – 2012-05-14 (×4): via TOPICAL
  Filled 2012-05-10 (×2): qty 30

## 2012-05-10 MED ORDER — ENOXAPARIN SODIUM 30 MG/0.3ML ~~LOC~~ SOLN
30.0000 mg | SUBCUTANEOUS | Status: DC
  Administered 2012-05-11: 30 mg via SUBCUTANEOUS
  Filled 2012-05-10: qty 0.3

## 2012-05-10 MED ORDER — 0.9 % SODIUM CHLORIDE (POUR BTL) OPTIME
TOPICAL | Status: DC | PRN
  Administered 2012-05-10: 1000 mL

## 2012-05-10 MED ORDER — SODIUM CHLORIDE 0.9 % IJ SOLN
3.0000 mL | Freq: Two times a day (BID) | INTRAMUSCULAR | Status: DC
  Administered 2012-05-11 – 2012-05-12 (×4): 3 mL via INTRAVENOUS

## 2012-05-10 MED ORDER — ROCURONIUM BROMIDE 100 MG/10ML IV SOLN
INTRAVENOUS | Status: DC | PRN
  Administered 2012-05-10: 50 mg via INTRAVENOUS

## 2012-05-10 MED ORDER — HYDROMORPHONE HCL PF 1 MG/ML IJ SOLN
0.5000 mg | INTRAMUSCULAR | Status: DC | PRN
  Administered 2012-05-10: 0.5 mg via INTRAVENOUS
  Administered 2012-05-11: 1 mg via INTRAVENOUS
  Administered 2012-05-11: 0.5 mg via INTRAVENOUS
  Administered 2012-05-12: 1 mg via INTRAVENOUS
  Filled 2012-05-10 (×4): qty 1

## 2012-05-10 MED ORDER — SODIUM CHLORIDE 0.9 % IV SOLN
INTRAVENOUS | Status: DC
  Administered 2012-05-10: 14:00:00 via INTRAVENOUS

## 2012-05-10 MED ORDER — PROPOFOL 10 MG/ML IV EMUL: 100.0000 ug/kg/min | Freq: Once | INTRAVENOUS | Status: DC

## 2012-05-10 MED ORDER — GLYCOPYRROLATE 0.2 MG/ML IJ SOLN
INTRAMUSCULAR | Status: DC | PRN
  Administered 2012-05-10: 0.6 mg via INTRAVENOUS

## 2012-05-10 MED ORDER — PROPOFOL 10 MG/ML IV BOLUS
INTRAVENOUS | Status: DC | PRN
  Administered 2012-05-10: 100 mg via INTRAVENOUS

## 2012-05-10 MED ORDER — PHENYLEPHRINE HCL 10 MG/ML IJ SOLN
INTRAMUSCULAR | Status: DC | PRN
  Administered 2012-05-10: 80 ug via INTRAVENOUS
  Administered 2012-05-10: 120 ug via INTRAVENOUS
  Administered 2012-05-10: 80 ug via INTRAVENOUS

## 2012-05-10 MED ORDER — BACITRACIN ZINC 500 UNIT/GM EX OINT
TOPICAL_OINTMENT | CUTANEOUS | Status: AC
  Filled 2012-05-10: qty 15

## 2012-05-10 MED ORDER — POLYETHYLENE GLYCOL 3350 17 G PO PACK
17.0000 g | PACK | Freq: Every day | ORAL | Status: DC | PRN
  Administered 2012-05-13: 17 g via ORAL
  Filled 2012-05-10: qty 1

## 2012-05-10 MED ORDER — CEFAZOLIN SODIUM 1-5 GM-% IV SOLN
INTRAVENOUS | Status: DC | PRN
  Administered 2012-05-10: 1 g via INTRAVENOUS

## 2012-05-10 MED ORDER — POTASSIUM CHLORIDE CRYS ER 20 MEQ PO TBCR: 20.0000 meq | EXTENDED_RELEASE_TABLET | Freq: Once | ORAL | Status: AC | PRN

## 2012-05-10 MED ORDER — SODIUM CHLORIDE 0.9 % IV SOLN: 250.0000 mL | INTRAVENOUS | Status: DC | PRN

## 2012-05-10 MED ORDER — SODIUM CHLORIDE 0.9 % IJ SOLN: 3.0000 mL | INTRAMUSCULAR | Status: DC | PRN

## 2012-05-10 MED ORDER — DEXTROSE 5 % IV SOLN
1.5000 g | Freq: Two times a day (BID) | INTRAVENOUS | Status: AC
  Administered 2012-05-11 (×2): 1.5 g via INTRAVENOUS
  Filled 2012-05-10 (×3): qty 1.5

## 2012-05-10 MED ORDER — LIDOCAINE HCL (CARDIAC) 20 MG/ML IV SOLN
INTRAVENOUS | Status: DC | PRN
  Administered 2012-05-10: 100 mg via INTRAVENOUS

## 2012-05-10 MED ORDER — SODIUM CHLORIDE 0.9 % IV SOLN
INTRAVENOUS | Status: DC | PRN
  Administered 2012-05-10: 14:00:00 via INTRAVENOUS

## 2012-05-10 MED ORDER — BISACODYL 10 MG RE SUPP: 10.0000 mg | Freq: Every day | RECTAL | Status: DC | PRN

## 2012-05-10 MED ORDER — CEFAZOLIN SODIUM 1-5 GM-% IV SOLN
INTRAVENOUS | Status: AC
  Filled 2012-05-10: qty 50

## 2012-05-10 SURGICAL SUPPLY — 50 items
BANDAGE ELASTIC 4 VELCRO ST LF (GAUZE/BANDAGES/DRESSINGS) ×2 IMPLANT
BANDAGE ELASTIC 6 VELCRO ST LF (GAUZE/BANDAGES/DRESSINGS) ×2 IMPLANT
BANDAGE ESMARK 6X9 LF (GAUZE/BANDAGES/DRESSINGS) IMPLANT
BANDAGE GAUZE ELAST BULKY 4 IN (GAUZE/BANDAGES/DRESSINGS) ×2 IMPLANT
BNDG CMPR 9X6 STRL LF SNTH (GAUZE/BANDAGES/DRESSINGS)
BNDG COHESIVE 6X5 TAN STRL LF (GAUZE/BANDAGES/DRESSINGS) ×2 IMPLANT
BNDG ESMARK 6X9 LF (GAUZE/BANDAGES/DRESSINGS)
CANISTER SUCTION 2500CC (MISCELLANEOUS) ×2 IMPLANT
CLIP TI MEDIUM 6 (CLIP) ×1 IMPLANT
CLOTH BEACON ORANGE TIMEOUT ST (SAFETY) ×2 IMPLANT
COVER SURGICAL LIGHT HANDLE (MISCELLANEOUS) ×2 IMPLANT
CUFF TOURNIQUET SINGLE 34IN LL (TOURNIQUET CUFF) IMPLANT
CUFF TOURNIQUET SINGLE 44IN (TOURNIQUET CUFF) IMPLANT
DRAIN CHANNEL 19F RND (DRAIN) IMPLANT
DRAPE ORTHO SPLIT 77X108 STRL (DRAPES) ×4
DRAPE PROXIMA HALF (DRAPES) ×2 IMPLANT
DRAPE SURG ORHT 6 SPLT 77X108 (DRAPES) ×2 IMPLANT
DRSG ADAPTIC 3X8 NADH LF (GAUZE/BANDAGES/DRESSINGS) ×2 IMPLANT
ELECT REM PT RETURN 9FT ADLT (ELECTROSURGICAL) ×2
ELECTRODE REM PT RTRN 9FT ADLT (ELECTROSURGICAL) ×1 IMPLANT
EVACUATOR SILICONE 100CC (DRAIN) IMPLANT
GAUZE SPONGE 4X4 16PLY XRAY LF (GAUZE/BANDAGES/DRESSINGS) ×2 IMPLANT
GLOVE BIOGEL PI IND STRL 7.5 (GLOVE) ×1 IMPLANT
GLOVE BIOGEL PI INDICATOR 7.5 (GLOVE) ×1
GLOVE SURG SS PI 7.5 STRL IVOR (GLOVE) ×3 IMPLANT
GOWN PREVENTION PLUS XXLARGE (GOWN DISPOSABLE) ×2 IMPLANT
GOWN STRL NON-REIN LRG LVL3 (GOWN DISPOSABLE) ×4 IMPLANT
KIT BASIN OR (CUSTOM PROCEDURE TRAY) ×2 IMPLANT
KIT ROOM TURNOVER OR (KITS) ×2 IMPLANT
NS IRRIG 1000ML POUR BTL (IV SOLUTION) ×3 IMPLANT
PACK GENERAL/GYN (CUSTOM PROCEDURE TRAY) ×2 IMPLANT
PAD ARMBOARD 7.5X6 YLW CONV (MISCELLANEOUS) ×4 IMPLANT
PADDING CAST COTTON 6X4 STRL (CAST SUPPLIES) IMPLANT
SAW GIGLI STERILE 20 (MISCELLANEOUS) ×2 IMPLANT
SPONGE GAUZE 4X4 12PLY (GAUZE/BANDAGES/DRESSINGS) ×3 IMPLANT
STAPLER VISISTAT 35W (STAPLE) ×2 IMPLANT
STOCKINETTE IMPERVIOUS LG (DRAPES) ×2 IMPLANT
SUT ETHILON 3 0 PS 1 (SUTURE) IMPLANT
SUT SILK 0 TIES 10X30 (SUTURE) ×1 IMPLANT
SUT SILK 2 0 (SUTURE) ×2
SUT SILK 2-0 18XBRD TIE 12 (SUTURE) ×1 IMPLANT
SUT SILK 3 0 (SUTURE)
SUT SILK 3-0 18XBRD TIE 12 (SUTURE) IMPLANT
SUT VIC AB 2-0 CT1 18 (SUTURE) ×5 IMPLANT
TAPE CLOTH SURG 4X10 WHT LF (GAUZE/BANDAGES/DRESSINGS) ×1 IMPLANT
TAPE UMBILICAL COTTON 1/8X30 (MISCELLANEOUS) ×2 IMPLANT
TOWEL OR 17X24 6PK STRL BLUE (TOWEL DISPOSABLE) ×2 IMPLANT
TOWEL OR 17X26 10 PK STRL BLUE (TOWEL DISPOSABLE) ×2 IMPLANT
UNDERPAD 30X30 INCONTINENT (UNDERPADS AND DIAPERS) ×2 IMPLANT
WATER STERILE IRR 1000ML POUR (IV SOLUTION) ×2 IMPLANT

## 2012-05-10 NOTE — Op Note (Signed)
[facility]  Patient name: Jenna Brown MRN: [id number] DOB: Oct 23, 1950 Sex: female  05/09/2012 - 05/10/2012 Pre-operative Diagnosis: ischemic right leg Post-operative diagnosis:  Same Surgeon:  [name] Assistants:  Lennie Muckle, P.A. Procedure:   Right Above Knee Amputation Anesthesia:  General Blood Loss:  See anesthesia record Specimens:  Right Leg  Findings:  Viable muscle, no active infection at level of amputation  Indications:  The patient has undergone multiple attempts at revascularization to heal a toe wound.  She developed an infection and new ulceration below and involving her distal incision site.  We have tried conservative measures to get this to heal including local wound care and antibiotics, however this has been unsuccessful.  The patient was re-admitted yesterday due to the wound and pain.  When I saw her this morning, she requested amputation due to her level of pain.  I explained that I did not feel that a below knee amputation would heal due to the location and that I would recommend an above knee amputation.  She and her daughter were in agreement  Procedure:  The patient was identified in the holding area and taken to [place] 11  The patient was then placed supine on the table. general anesthesia was administered.  The patient was prepped and draped in the usual sterile fashion.  A time out was called and antibiotics were administered.  A fishmouth incision was made above the knee.  Cautery was used to divide the subcutaneous tissue and muscle down to the femur.  An elevator was used to elevate the periosteum.  A gigli saw was used to transect the bone.  Hemostats were placed around the neurovascular bundle, and the remaining soft tissue and muscle were divided with cautery.  I then individually isolated the nerve, artery, and vein and individually ligated each with a 0 silk, proximal to the cut edge of the bone.  I removed all  prosthetic graft.  The wound was then irrigated and hemostasis was achieved.  A rasp was used to smooth the bone edge.  The fascia was then re-approximated with 2-0 vicryl and the skin was closed with staples.  Sterile dressings were applied.   Disposition:  To PACU in stable condition.   Theotis Burrow, M.D. [facility] Office: [phone] Pager:  [phone]

## 2012-05-10 NOTE — Anesthesia Postprocedure Evaluation (Signed)
Anesthesia Post Note  Patient: Jenna Brown  Procedure(s) Performed: Procedure(s) (LRB): AMPUTATION ABOVE KNEE (Right)  Anesthesia type: general  Patient location: PACU  Post pain: Pain level controlled  Post assessment: Patient's Cardiovascular Status Stable  Last Vitals:  Filed Vitals:   05/10/12 1800  BP:   Pulse: 104  Temp:   Resp: 22    Post vital signs: Reviewed and stable  Level of consciousness: sedated  Complications: needed to be reintubated for poor respiratory effort. Abg and CXR normal. Pt extubated -  to step down over night.

## 2012-05-10 NOTE — Interval H&P Note (Signed)
History and Physical Interval Note:  05/10/2012 2:24 PM  Jenna Brown  has presented today for surgery, with the diagnosis of PVD  The various methods of treatment have been discussed with the patient and family. After consideration of risks, benefits and other options for treatment, the patient has consented to  Procedure(s) (LRB): AMPUTATION ABOVE KNEE (Right) as a surgical intervention .  The patient's history has been reviewed, patient examined, no change in status, stable for surgery.  I have reviewed the patient's chart and labs.  Questions were answered to the patient's satisfaction.     Karion Cudd IV, V. WELLS

## 2012-05-10 NOTE — H&P (Signed)
I agree with the above I have seen and examined the patient. I had a lengthy discussion with the patient and her daughter at the bedside. The patient has intractable pain to 2 for nonhealing wound on the right. The patient is actually requesting to proceed with amputation. She is not able to eat because of the pain she is having she is also not able to sleep.  On examination the eschar below the incision has increased in size. There is no surrounding erythema. The wound opening continues to remain very deep. I probed this with a Q-tip and it does go up to the tibial plateau.  I discussed that because of her blood supply I think the likelihood of this wound healing is very low. I am in agreement with proceeding with amputation. We talked about the level of amputation and because of how proximal the wound tracts I do not think that she is a good candidate for a below knee amputation as I think this will likely not heal. I have therefore recommended proceeding with a right above-knee amputation. The patient and her daughter are in agreement with this plan. This is been scheduled for later on today.  Annamarie Major

## 2012-05-10 NOTE — Transfer of Care (Signed)
Immediate Anesthesia Transfer of Care Note  Patient: Jenna Brown  Procedure(s) Performed: Procedure(s) (LRB): AMPUTATION ABOVE KNEE (Right)  Patient Location: PACU  Anesthesia Type: General  Level of Consciousness: sedated and unresponsive  Airway & Oxygen Therapy: Patient Spontanous Breathing and Patient connected to T-piece oxygen  Post-op Assessment: Report given to PACU RN, Post -op Vital signs reviewed and stable and Patient moving all extremities X 4  Post vital signs: Reviewed and stable  Complications: Patient re-intubated

## 2012-05-10 NOTE — Progress Notes (Signed)
[facility] PROGRESS NOTE  ANJANNETTE WEYER [id number] DOB: Oct 08, 1950 DOA: 05/09/2012 PCP: [name], MD  Assessment/Plan: Active Problems:  Stroke  Hypertension  Hyperlipidemia  PVD (peripheral vascular disease)  Chronic total occlusion of artery of the extremities  Anxiety Anemia Hypokalemia  1. DM: Patient is on glyburide and metformin. Given her elevated creatinine of 1.36 (near to contraindication of 1.4) I will plan on holding her metformin. Will plan on placing on SSI once patient able to eat would place on diabetic diet. Also will monitor blood sugars fasting, Qac and Qhs.  2. HTN: Will plan on holding patient's cozaar. Will continue however her HCTZ, amlodipine. Monitor blood pressures and adjust medication pending readings. 3. HPL: Given history of PVD, HPL, and Stroke would plan on continuing patient's zocor 4. H/o Prior stroke: Continue zocor and blood pressure control.  5. Anxiety: Stable will continue xanax PRN. 6. RLE pain with total occlusion of artery of extremities/wound dehiscence: Vascular surgery on board and managing. Will defer antibiotic regimen, anticoagulation, and pain regimen to them. Pt is s/p day 0 R AKA. 7. Anemia:  Pt is s/p recent operation and likely secondary to recent blood loss. Recheck CBC next am.  Would consider Iron supplementation once patient is taking po. 8. Hypokalemia resolved.  Code Status: Full Family Communication: No family at bedside Disposition Plan: To step down for closer monitoring post R AKA   Brief narrative: Pt is a 61 y/o that was admitted secondary to nonhealing wound at RLE and discomfort.  Pt has h/o PVD and was s/p right and posterior tibial bypass in July with early occlusion and revision with know graft occlusion.  After evaluation of nonhealing wound it was decided that patient would require R AKA.  Patient tolerated the procedure well but post op had difficulty breathing and needed to be reintubated.   Was able to be transitioned off of the ventilator and is currently awaiting transfer to stepdown.  Consultants:  (internal medicine)  Procedures:  R AKA  Antibiotics:  Levaquin  HPI/Subjective: Patient is sedated but off of the ventilator.    Objective: Filed Vitals:   05/10/12 1828 05/10/12 1838 05/10/12 1845 05/10/12 1854  BP:      Pulse: 105 104 104 107  Temp:      TempSrc:      Resp: 20 23 28 21   Height:      Weight:      SpO2: 100% 100% 100% 99%    Intake/Output Summary (Last 24 hours) at 05/10/12 1906 Last data filed at 05/10/12 1536  Gross per 24 hour  Intake    500 ml  Output     50 ml  Net    450 ml   Filed Weights   05/09/12 2047  Weight: 66.2 kg (145 lb 15.1 oz)    Exam:   General:  Pt in NAD, laying supine in bed off of ventilator  Cardiovascular: RRR, No MRG  Respiratory: CTA BL, no wheezes  Abdomen: Soft, ND  Data Reviewed: Basic Metabolic Panel:  Lab [id number] 05/09/12 1609 05/04/12 0617  NA 139 142 138  K 4.4 3.2* 4.3  CL -- 99 99  CO2 -- 29 25  GLUCOSE -- 86 193*  BUN -- 18 27*  CREATININE -- 1.36* 1.05  CALCIUM -- 9.8 9.5  MG -- -- --  PHOS -- -- --   Liver Function Tests:  Lab 05/09/12 1609  AST 9  ALT 6  ALKPHOS 96  BILITOT 0.2*  PROT 7.5  ALBUMIN 2.8*   No results found for this basename: LIPASE:5,AMYLASE:5 in the last 168 hours No results found for this basename: AMMONIA:5 in the last 168 hours CBC:  Lab 05/10/12 1732 05/09/12 1609 05/04/12 0617  WBC -- 16.9* 20.1*  NEUTROABS -- -- --  HGB 8.5* 9.4* 8.8*  HCT 25.0* 29.6* 27.5*  MCV -- 79.4 80.6  PLT -- 491* 411*   Cardiac Enzymes: No results found for this basename: CKTOTAL:5,CKMB:5,CKMBINDEX:5,TROPONINI:5 in the last 168 hours BNP (last 3 results) No results found for this basename: PROBNP:3 in the last 8760 hours CBG:  Lab 05/10/12 1637 05/10/12 1415 05/10/12 1353 05/10/12 1124 05/10/12 0601  GLUCAP 199* 160* 65* 99 86    Recent Results  (from the past 240 hour(s))  MRSA PCR SCREENING     Status: Normal   Collection Time   05/09/12  4:13 PM      Component Value Range Status Comment   MRSA by PCR NEGATIVE  NEGATIVE Final      Studies: Dg Chest Port 1 View  05/10/2012  *RADIOLOGY REPORT*  Clinical Data: Respiratory difficulty  PORTABLE CHEST - 1 VIEW  Comparison: 05/17/2011  Findings: Endotracheal tube placed.  Tip is 3.1 cm from the carina. Moderate gastric distention with gas.  Low lung volumes.  Lungs grossly clear.  No pneumothorax.  IMPRESSION: Endotracheal tube tip 3.1 cm from the carina.  Low volumes  Gastric distention with gas.   Original Report Authenticated By: Jamas Lav, M.D.     Scheduled Meds:   . amLODipine  10 mg Oral q morning - 10a  . collagenase   Topical Daily  . dextrose  12.5 g Intravenous Once  . glyBURIDE  5 mg Oral BID WC  . hydrochlorothiazide  25 mg Oral q morning - 10a  . insulin aspart  0-15 Units Subcutaneous TID WC  . levofloxacin  500 mg Oral Daily  . linagliptin  5 mg Oral Daily  . pantoprazole  40 mg Oral Q1200  . propofol  100 mcg/kg/min Intravenous Once  . senna  1 tablet Oral BID  . simvastatin  40 mg Oral Daily  . sodium chloride  3 mL Intravenous Q12H   Continuous Infusions:   . sodium chloride 20 mL/hr at 05/10/12 1344    Active Problems:  Stroke  Hypertension  Hyperlipidemia  PVD (peripheral vascular disease)  Chronic total occlusion of artery of the extremities  Anxiety    Time spent: > 30 minutes    Velvet Bathe  [facility] Pager [phone] If 8PM-8AM, please contact night-coverage at [email or web address], password [facility] 05/10/2012, 7:06 PM  LOS: 1 day

## 2012-05-10 NOTE — Progress Notes (Signed)
Patient received in to PACU,  Noted to have very poor respiratory effort, and oxygen saturation of 64%.  CRNA Janene Harvey at bedside.  Inserted Nasal airway, and then oral airway.  Patient unresponsive.  Patient given total of 0.4 MG of Narcan by Dr. Conrad Kahlotus, but continued to have only gasping respirations.  Breath sounds bilaterally audible, but weak.  Patient was intubated with a 7.5 ett by Janene Harvey with dr Conrad Benton at the bedside.  Placed on 700 TV, 12 IMV, 5 Peep, and 100% fio2.  Chest xray ordered, and obtained. ABG ordered and obtained after several attempts.

## 2012-05-10 NOTE — Anesthesia Preprocedure Evaluation (Addendum)
Anesthesia Evaluation  Patient identified by MRN, date of birth, ID band Patient awake and Patient confused    History of Anesthesia Complications (+) PONV  Airway Mallampati: II TM Distance: >3 FB Neck ROM: Full    Dental  (+) Edentulous Upper and Edentulous Lower   Pulmonary former smoker,          Cardiovascular hypertension, Pt. on medications + Past MI and + Peripheral Vascular Disease     Neuro/Psych  Headaches, Anxiety CVA, No Residual Symptoms    GI/Hepatic hiatal hernia, GERD-  Medicated and Controlled,  Endo/Other  Poorly Controlled, Type 2  Renal/GU      Musculoskeletal   Abdominal   Peds  Hematology   Anesthesia Other Findings PAD  Reproductive/Obstetrics                           Anesthesia Physical Anesthesia Plan  ASA: III  Anesthesia Plan: General   Post-op Pain Management:    Induction: Intravenous  Airway Management Planned: Oral ETT  Additional Equipment:   Intra-op Plan:   Post-operative Plan: Extubation in OR  Informed Consent: I have reviewed the patients History and Physical, chart, labs and discussed the procedure including the risks, benefits and alternatives for the proposed anesthesia with the patient or authorized representative who has indicated his/her understanding and acceptance.     Plan Discussed with: CRNA and Anesthesiologist  Anesthesia Plan Comments:       Anesthesia Quick Evaluation

## 2012-05-10 NOTE — Progress Notes (Signed)
Inpatient Diabetes Program Recommendations  AACE/ADA: New Consensus Statement on Inpatient Glycemic Control (2013)  Target Ranges:  Prepandial:   less than 140 mg/dL      Peak postprandial:   less than 180 mg/dL (1-2 hours)      Critically ill patients:  140 - 180 mg/dL   Reason for Visit: Note hypoglycemic event. Consider holding Diabeta 5 mg while patient is in hospital.

## 2012-05-10 NOTE — Progress Notes (Signed)
Care of pt assumed by [facility].. I updated Dr [name] by phone re events, he is aware pt is now extubated.  Will cont to monitor pt in PACU post extub. And pt will go to stepdown bed overnight.

## 2012-05-10 NOTE — Consult Note (Signed)
[facility]  Medical Consultation  Jenna Brown [id number] DOB: [birth year] DOA: 05/09/2012  PCP: [name], MD  Requesting physician: Dr. [name]  Date of consultation: 05/09/12  Reason for consultation: Medical management of current medical conditions  Impression/Recommendations  Active Problems:  HTN  DM  Hyperlipidemia  H/o prior stroke  Chronic total occlusion of artery of the extremities  Wound dehiscence  Anxiety  1. DM: Patient is on glyburide and metformin. Given her elevated creatinine of 1.36 (near to contraindication of 1.4) I will plan on holding her metformin. Will plan on placing on SSI with diabetic diet patient will be npo after midnight excepts sips with medication. Also will monitor blood sugars fasting, Qac and Qhs. Unless her creatinine decreases significantly patient may have to be discharged off of her metformin 2. HTN: Will plan on holding patient's cozaar. Will continue however her HCTZ, amlodipine. Monitor blood pressures and adjust medication pending readings. 3. HPL: Given history of PVD, HPL, and Stroke would plan on continuing patient's zocor 4. H/o Prior stroke: Continue zocor and blood pressure control.  5. Anxiety: Stable will continue xanax PRN. 6. RLE pain with total occlusion of artery of extremities/wound dehiscence: Vascular surgery on board and managing. Will defer antibiotic regimen, anticoagulation, and pain regimen to them. I will followup again tomorrow. Please contact me if I can be of assistance in the meanwhile. Thank you for this consultation.  Chief Complaint: RLE pain  HPI:  Patient is a 61 y/o with h/o stroke, HPL, and PVD s/p thrombectomy and revision of right femoral posterior tibial bypass graft, redo right common femoral and posterior tibial artery exposure, and patch angioplasty right posterior tibial artery bypass graft on 04/05/12. She presented to the ED from her SNF c/o RLE discomfort and nonhealing wounds. Vascular  surgery was called for evaluation in the ED and we were consulted for medical management of her medical comorbidites.  At this point patient has been admitted for pain control, antibiotics, and wound care.  We will manage patient's medical problems while in house. Her blood sugars have ranged from 101-202 while in house. Creatinine at 1.36.  Review of Systems:  Pt denies any hemoptysis, nausea, emesis, abd discomfort, ha's, fever, chills, BRBPR, hematuria, diaphoresis, active bleeding, SI, chest discomfort, cough, SOB, focal neurological deficits, blurred vision.  Past Medical History   Diagnosis  Date   .  Hyperlipidemia      takes Simvastatin daily   .  Peripheral vascular disease    .  Stroke      TIA history   .  PONV (postoperative nausea and vomiting)    .  Hypertension      takes Amlodipine/HCTZ/Losartan daily   .  Vertigo      takes Ativert prn   .  Hemorrhoids    .  Anxiety      takes Xanax prn   .  Myocardial infarction  1990's   .  Type II diabetes mellitus      takes Glucovance and Januvia daily   .  H/O hiatal hernia    .  GERD (gastroesophageal reflux disease)    .  Headache      when b/p is elevated   .  Migraines    .  Arthritis     Past Surgical History   Procedure  Date   .  Cleft palate repair      several   .  Pr vein bypass graft,aorto-fem-pop  05/27/11  Right SFA-Below knee Pop BP   .  Tonsillectomy    .  Dilation and curettage of uterus    .  Aortogram  02/2012   .  Colonoscopy    .  Femoral-tibial bypass graft  03/29/2012     Procedure: BYPASS GRAFT FEMORAL-TIBIAL ARTERY; Surgeon: [name], MD; Location: [facility]; Service: Vascular; Laterality: Right; Right femoral to Posterior Tibialis with composite graft of 55mm x 80 cm ringed gortex graft and saphenous vein ,intraoperative arteriogram   .  Femoral-popliteal bypass graft  04/05/2012     Procedure: BYPASS GRAFT FEMORAL-POPLITEAL ARTERY; Surgeon: [name], MD; Location: [facility]; Service:  Vascular; Laterality: Right; REVISION   .  Myomectomy    .  Tubal ligation  ~ 1990    Social History: reports that she quit smoking about 2 months ago. Her smoking use included Cigarettes. She has a 10 pack-year smoking history. She has never used smokeless tobacco. She reports that she does not drink alcohol or use illicit drugs.  Allergies   Allergen  Reactions   .  Plavix (Clopidogrel Bisulfate)  Itching   .  Codeine  Other (See Comments)     "makes me feel strange"   .  Tylenol (Acetaminophen)  Other (See Comments)     "Doesn't feel right"   .  Tylenol With Codeine #3 (Acetaminophen-Codeine)  Other (See Comments)     "doesn't make me feel right"    Family History   Problem  Relation  Age of Onset   .  Cancer  Mother       breast    .  Diabetes  Father     Prior to Admission medications   Medication  Sig  Start Date  End Date  Taking?  Authorizing Provider   ALPRAZolam (XANAX) 0.25 MG tablet  Take 0.25 mg by mouth daily as needed. For anxiety    Yes  Historical Provider, MD   amLODipine (NORVASC) 10 MG tablet  Take 10 mg by mouth every morning.  01/30/12   Yes  Historical Provider, MD   collagenase (SANTYL) ointment  Apply topically daily. To right lower leg wound  05/04/12  05/14/12  Yes  Regina J [place], PA   glyBURIDE-metformin (GLUCOVANCE) 5-500 MG per tablet  Take 1 tablet by mouth 2 (two) times daily with a meal.    Yes  Historical Provider, MD   hydrochlorothiazide (HYDRODIURIL) 25 MG tablet  Take 25 mg by mouth every morning.  02/09/12   Yes  Historical Provider, MD   HYDROcodone-acetaminophen (VICODIN) 5-500 MG per tablet  Take 1 tablet by mouth every 6 (six) hours as needed for pain.  05/04/12  05/14/12  Yes  Regina J Roczniak, PA   levofloxacin (LEVAQUIN) 500 MG tablet  Take 1 tablet (500 mg total) by mouth daily.  05/04/12  05/14/12  Yes  Regina J Roczniak, PA   losartan (COZAAR) 100 MG tablet  Take 100 mg by mouth every morning.  01/30/12   Yes  Historical Provider, MD     meclizine (ANTIVERT) 25 MG tablet  Take 25 mg by mouth daily as needed. For vertigo    Yes  Historical Provider, MD   Rivaroxaban (XARELTO) 20 MG TABS  Take 20 mg by mouth every evening. At 4pm.  04/08/12   Yes  Ulyses Amor, PA   simvastatin (ZOCOR) 40 MG tablet  Take 40 mg by mouth every morning.    Yes  Historical Provider, MD  sitaGLIPtin (JANUVIA) 100 MG tablet  Take 100 mg by mouth every morning.    Yes  Historical Provider, MD   Physical Exam:  Blood pressure 133/79, pulse 108, temperature 97.5 F (36.4 C), temperature source Oral, resp. rate 20, SpO2 99.00%.  Filed Vitals:    05/09/12 1345  05/09/12 1445  05/09/12 1504  05/09/12 1610   BP:  105/50  94/69  94/50  133/79   Pulse:  103  99  96  108   Temp:     97.5 F (36.4 C)   TempSrc:     Oral   Resp:  30  20  29  20    SpO2:  97%  97%  99%  99%    General: Pt in NAD, laying in bed supine  Eyes: EOMI, PERRLA  ENT: no masses on visual inspection  Neck: no masses or goiter  Cardiovascular: RRR, No MRG  Respiratory: CTA BL, no wheezes  Abdomen: Soft, NT, ND  Skin: no diaphoresis, rashes  Musculoskeletal: Pain localized to RLE with movement  Psychiatric: Mood and affect appropriate  Neurologic: answers questions appropriately and moves all extremities. Labs on Admission:  Basic Metabolic Panel:   Lab  [id number]  05/04/12 0617   NA  142  138   K  3.2*  4.3   CL  99  99   CO2  29  25   GLUCOSE  86  193*   BUN  18  27*   CREATININE  1.36*  1.05   CALCIUM  9.8  9.5   MG  --  --   PHOS  --  --    Liver Function Tests:   Lab  05/09/12 1609   AST  9   ALT  6   ALKPHOS  96   BILITOT  0.2*   PROT  7.5   ALBUMIN  2.8*    No results found for this basename: LIPASE:5,AMYLASE:5 in the last 168 hours  No results found for this basename: AMMONIA:5 in the last 168 hours  CBC:   Lab  05/09/12 1609  05/04/12 0617   WBC  16.9*  20.1*   NEUTROABS  --  --   HGB  9.4*  8.8*   HCT  29.6*  27.5*   MCV  79.4  80.6    PLT  491*  411*    Cardiac Enzymes:  No results found for this basename: CKTOTAL:5,CKMB:5,CKMBINDEX:5,TROPONINI:5 in the last 168 hours  BNP:  No components found with this basename: POCBNP:5  CBG:   Lab  05/09/12 1637  05/04/12 1201  05/04/12 0630  05/03/12 2222  05/03/12 1631   GLUCAP  101*  202*  174*  141*  173*    Radiological Exams on Admission:  No results found.  EKG: Independently reviewed. Ordered and Pending  Time spent: > 45 minutes  Velvet Bathe  [facility]  Pager [phone]  If 7PM-7AM, please contact night-coverage  [email or web address]  Password [facility]  05/09/2012, 6:25 PM

## 2012-05-10 NOTE — Anesthesia Procedure Notes (Signed)
Procedure Name: Intubation Date/Time: 05/10/2012 2:46 PM Performed by: Loralyn Freshwater Pre-anesthesia Checklist: Patient identified, Emergency Drugs available, Suction available, Patient being monitored and Timeout performed Patient Re-evaluated:Patient Re-evaluated prior to inductionOxygen Delivery Method: Circle system utilized Preoxygenation: Pre-oxygenation with 100% oxygen Intubation Type: IV induction Ventilation: Mask ventilation without difficulty and Oral airway inserted - appropriate to patient size Laryngoscope Size: Mac and 4 Grade View: Grade III Tube type: Oral Tube size: 7.5 mm Number of attempts: 1 Airway Equipment and Method: Stylet Placement Confirmation: positive ETCO2 and breath sounds checked- equal and bilateral Secured at: 21 cm Tube secured with: Tape Dental Injury: Teeth and Oropharynx as per pre-operative assessment

## 2012-05-10 NOTE — Preoperative (Signed)
Beta Blockers   Reason not to administer Beta Blockers:Not Applicable 

## 2012-05-10 NOTE — Progress Notes (Signed)
INITIAL ADULT NUTRITION ASSESSMENT Date: 05/10/2012   Time: 12:11 PM  INTERVENTION:  Advance diet as medically appropriate RD to follow for nutrition care plan, add interventions accordingly   Reason for Assessment: Malnutrition Screening Tool Report  ASSESSMENT: Female 61 y.o.  Dx: R lower extremity pain  Hx:  Past Medical History  Diagnosis Date  . Hyperlipidemia     takes Simvastatin daily  . Peripheral vascular disease   . Stroke     TIA history  . PONV (postoperative nausea and vomiting)   . Hypertension     takes Amlodipine/HCTZ/Losartan daily  . Vertigo     takes Ativert prn  . Hemorrhoids   . Anxiety     takes Xanax prn  . Myocardial infarction 1990's  . Type II diabetes mellitus     takes Glucovance and Januvia daily  . H/O hiatal hernia   . GERD (gastroesophageal reflux disease)   . Headache     when b/p is elevated  . Migraines   . Arthritis     Related Meds:     . amLODipine  10 mg Oral q morning - 10a  . collagenase   Topical Daily  . glyBURIDE  5 mg Oral BID WC  . hydrochlorothiazide  25 mg Oral q morning - 10a  . insulin aspart  0-15 Units Subcutaneous TID WC  . levofloxacin  500 mg Oral Daily  . linagliptin  5 mg Oral Daily  . pantoprazole  40 mg Oral Q1200  . potassium chloride  20-40 mEq Oral Once  . senna  1 tablet Oral BID  . simvastatin  40 mg Oral Daily  . sodium chloride  3 mL Intravenous Q12H  . DISCONTD: glyBURIDE-metformin  1 tablet Oral BID WC  . DISCONTD: insulin aspart  0-15 Units Subcutaneous TID WC  . DISCONTD: losartan  100 mg Oral q morning - 10a  . DISCONTD: metFORMIN  500 mg Oral BID WC    Ht: 5\' 4"  (162.6 cm)  Wt: 145 lb 15.1 oz (66.2 kg)  Ideal Wt: 54.5 kg % Ideal Wt: 121%  Usual Wt: 162 lb -- per office visit record 03/19/12 % Usual Wt: 90%  Body mass index is 25.05 kg/(m^2).  Food/Nutrition Related Hx: recent weight loss without trying & decreased appetite per admission nutrition screen  Labs:  CMP    Component Value Date/Time   NA 142 05/09/2012 1609   K 3.2* 05/09/2012 1609   CL 99 05/09/2012 1609   CO2 29 05/09/2012 1609   GLUCOSE 86 05/09/2012 1609   BUN 18 05/09/2012 1609   CREATININE 1.36* 05/09/2012 1609   CALCIUM 9.8 05/09/2012 1609   PROT 7.5 05/09/2012 1609   ALBUMIN 2.8* 05/09/2012 1609   AST 9 05/09/2012 1609   ALT 6 05/09/2012 1609   ALKPHOS 96 05/09/2012 1609   BILITOT 0.2* 05/09/2012 1609   GFRNONAA 42* 05/09/2012 1609   GFRAA 49* 05/09/2012 1609     Intake/Output Summary (Last 24 hours) at 05/10/12 1243 Last data filed at 05/09/12 1700  Gross per 24 hour  Intake      0 ml  Output      0 ml  Net      0 ml    CBG (last 3)   Basename 05/10/12 1124 05/10/12 0601 05/09/12 2132  GLUCAP 99 86 77    Diet Order: NPO  Supplements/Tube Feeding: N/A  IVF: N/A  Estimated Nutritional Needs:   Kcal: 1800-2000 Protein: 90-100 gm Fluid: 1.8-2.0 L  Patient admitted for R extremity amputation; states her appetite has been poor prior to admission x 1 month; noted patient previously admitted 8/14 for R leg wound infection; reported she "grazes" throughout the day; she also reports she's lost weight, however, unable to quantify amount or time frame; per office visit records, she's lost approximately 17 lbs x 2 months (10%); amenable to addition of supplements when able -- RD to order.  Patient meets criteria for non-severe (moderate) malnutrition in the context of chronic illness given < 75% intake of estimated energy requirement for > 1 month and 10% weight loss x 2 months.  NUTRITION DIAGNOSIS: -Increased nutrient needs (NI-5.1).  Status: Ongoing  RELATED TO: post-op healing  AS EVIDENCE BY: estimated nutrition needs  MONITORING/EVALUATION(Goals): Goal: Oral intake with meals & supplements to meet >/= 90% of estimated nutrition needs Monitor: PO & supplemental intake, weight, labs, I/O's  EDUCATION NEEDS: -No education needs identified at this time  Dietitian #:  [place] Per approved criteria  -Non-severe (moderate) malnutrition in the context of chronic illness    Jenna Brown 05/10/2012, 12:11 PM

## 2012-05-10 NOTE — Care Management Note (Signed)
    Page 1 of 1   05/14/2012     11:51:25 AM   CARE MANAGEMENT NOTE 05/14/2012  Patient:  PERSAEUS, MAHADEVAN   Account Number:  [id number]  Date Initiated:  05/10/2012  Documentation initiated by:  SIMMONS,CRYSTAL  Subjective/Objective Assessment:   ADMITTED WITH PAD; FROM [facility].     Action/Plan:   CSW FOLLOWING FOR RETURN TO SNF WHEN MEDICALLY STABLE.   Anticipated DC Date:  05/14/2012   Anticipated DC Plan:  SKILLED NURSING FACILITY  In-house referral  Clinical Social Worker      DC Planning Services  CM consult      Choice offered to / List presented to:             Status of service:  Completed, signed off Medicare Important Message given?   (If response is "NO", the following Medicare IM given date fields will be blank) Date Medicare IM given:   Date Additional Medicare IM given:    Discharge Disposition:  [place]  Per UR Regulation:  Reviewed for med. necessity/level of care/duration of stay  If discussed at [place] of Stay Meetings, dates discussed:    Comments:  05/14/12 Aison Malveaux,RN,BSN 1145 PER DR EARLY, PT ABLE TO RETURN TO [facility] TODAY. WILL NOTIFY CSW OF PENDING DC.  PA AWARE THAT WE WILL NEED DC SUMMARY; MD TO SIGN FL2.

## 2012-05-11 ENCOUNTER — Encounter (HOSPITAL_COMMUNITY): Payer: Self-pay | Admitting: Surgery

## 2012-05-11 DIAGNOSIS — E119 Type 2 diabetes mellitus without complications: Secondary | ICD-10-CM | POA: Diagnosis present

## 2012-05-11 LAB — BASIC METABOLIC PANEL
BUN: 14 mg/dL (ref 6–23)
Chloride: 101 mEq/L (ref 96–112)
GFR calc Af Amer: 87 mL/min — ABNORMAL LOW (ref >90)
Potassium: 4.1 mEq/L (ref 3.5–5.1)

## 2012-05-11 LAB — GLUCOSE, CAPILLARY: Glucose-Capillary: 117 mg/dL — ABNORMAL HIGH (ref 70–99)

## 2012-05-11 LAB — CBC
HCT: 26.1 % — ABNORMAL LOW (ref 36.0–46.0)
Platelets: 442 10*3/uL — ABNORMAL HIGH (ref 150–400)
RDW: 13.3 % (ref 11.5–15.5)
WBC: 16.9 10*3/uL — ABNORMAL HIGH (ref 4.0–10.5)

## 2012-05-11 MED ORDER — ATORVASTATIN CALCIUM 20 MG PO TABS
20.0000 mg | ORAL_TABLET | Freq: Every day | ORAL | Status: DC
  Administered 2012-05-11 – 2012-05-13 (×3): 20 mg via ORAL
  Filled 2012-05-11 (×4): qty 1

## 2012-05-11 MED ORDER — FERROUS SULFATE 325 (65 FE) MG PO TABS
325.0000 mg | ORAL_TABLET | Freq: Two times a day (BID) | ORAL | Status: DC
  Administered 2012-05-12 – 2012-05-14 (×6): 325 mg via ORAL
  Filled 2012-05-11 (×7): qty 1

## 2012-05-11 MED ORDER — ENOXAPARIN SODIUM 40 MG/0.4ML ~~LOC~~ SOLN
40.0000 mg | SUBCUTANEOUS | Status: DC
  Administered 2012-05-12 – 2012-05-14 (×3): 40 mg via SUBCUTANEOUS
  Filled 2012-05-11 (×4): qty 0.4

## 2012-05-11 MED FILL — Naloxone HCl Inj 0.4 MG/ML: INTRAMUSCULAR | Qty: 1 | Status: AC

## 2012-05-11 MED FILL — Hydromorphone HCl Inj 1 MG/ML: INTRAMUSCULAR | Qty: 1 | Status: AC

## 2012-05-11 NOTE — Progress Notes (Signed)
CSW assessed pt at beside.  Pt in good spirits re: surgery (BKA).  Pt will be going back to [facility] upon d/c. Nonnie Done, [place] [phone]  Clinical Social Work

## 2012-05-11 NOTE — Progress Notes (Signed)
Physical medicine and rehabilitation consult requested. 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 Patient was discharged to skilled [facility] 05/04/2012 after recent revascularization procedure. Plan remains upon discharge to return to skilled nursing facility. Patient does have a daughter and sister in the area but unable to provide care. At this time plan would be for turned to skilled nursing facility at patient's request. Will hold on formal rehabilitation consult at this time she would not be a candidate due to discharge plan will discuss with case management. Please reconsult and discharge plan was to change.

## 2012-05-11 NOTE — Progress Notes (Signed)
[facility] [place]  Postoperative Visit - Amputation  Date of Surgery: 05/09/2012 - 05/10/2012 Procedure(s): AMPUTATION ABOVE KNEE Right Surgeon: Surgeon(s): [name], MD POD: 1 Day Post-Op  Subjective Jenna Brown is a 61 y.o. female who is S/P Right Procedure(s): AMPUTATION ABOVE KNEE.  Pt.denies increased pain in the stump. The patient notes pain is well controlled. Pt. denies phantom pain.  Significant Diagnostic Studies: CBC Lab Results  Component Value Date   WBC 16.9* 05/11/2012   HGB 8.4* 05/11/2012   HCT 26.1* 05/11/2012   MCV 79.3 05/11/2012   PLT 442* 05/11/2012    BMET    Component Value Date/Time   NA 141 05/11/2012 0530   K 4.1 05/11/2012 0530   CL 101 05/11/2012 0530   CO2 26 05/11/2012 0530   GLUCOSE 203* 05/11/2012 0530   BUN 14 05/11/2012 0530   CREATININE 0.84 05/11/2012 0530   CALCIUM 9.2 05/11/2012 0530   GFRNONAA 75* 05/11/2012 0530   GFRAA 87* 05/11/2012 0530    COAG Lab Results  Component Value Date   INR 1.31 05/10/2012   INR 1.57* 05/09/2012   INR 1.35 05/02/2012   No results found for this basename: PTT     Intake/Output Summary (Last 24 hours) at 05/11/12 0753 Last data filed at 05/11/12 0500  Gross per 24 hour  Intake    630 ml  Output   1150 ml  Net   -520 ml   No data found.    Physical Examination  BP Readings from Last 3 Encounters:  05/11/12 141/70  05/11/12 141/70  05/04/12 118/74   Temp Readings from Last 3 Encounters:  05/11/12 98.5 F (36.9 C) Oral  05/11/12 98.5 F (36.9 C) Oral  05/04/12 98.8 F (37.1 C) Oral   SpO2 Readings from Last 3 Encounters:  05/11/12 100%  05/11/12 100%  05/04/12 94%   Pulse Readings from Last 3 Encounters:  05/11/12 106  05/11/12 106  05/04/12 112    Pt is A&Ox3  WDWN female with no complaints  Right amputation wound is clean and dry dressing. There is good bone coverage in the stump Stump is warm and well perfused, without drainage; without  erythema around the dressing.   Assessment/plan:  Jenna Brown is a 61 y.o. female who is s/p Right Procedure(s): AMPUTATION ABOVE KNEE  The patient's stump is viable.  Plan to change dressing in am.  Transfer to 2000  Follow-up 4 weeks from surgery  [name] [facility] 7:53 AM 05/11/2012 [id number]

## 2012-05-11 NOTE — Clinical Social Work Psychosocial (Signed)
Clinical Social Work Department BRIEF PSYCHOSOCIAL ASSESSMENT 05/11/2012  Patient:  SOPHIA, MUHS     Account Number:  [id number]     Admit date:  05/09/2012  Clinical Social Worker:  Wylene Men  Date/Time:  05/11/2012 09:19 AM  Referred by:  RN  Date Referred:  05/11/2012 Referred for  SNF Placement   Other Referral:   none   Interview type:  Other - See comment Other interview type:   Pt and daughter    PSYCHOSOCIAL DATA Living Status:  FACILITY Admitted from facility:  [facility] Level of care:  [place] Primary support name:  daughter Primary support relationship to patient:  CHILD, ADULT Degree of support available:   fair    CURRENT CONCERNS Current Concerns  Post-Acute Placement   Other Concerns:   none    SOCIAL WORK ASSESSMENT / PLAN CSW assessed pt at bedside.  Pt was in good spirits re: surgery later the afternoon.  She will be having a right BKA.  Pt will be returning to SNF, [facility] upon d/c. Daughter was at bedside for support before surgery.   Assessment/plan status:  Psychosocial Support/Ongoing Assessment of Needs Other assessment/ plan:   none   Information/referral to community resources:   SNF    PATIENT'S/FAMILY'S RESPONSE TO PLAN OF CARE: Family and pt very appreciative of CSW involvment.        Nonnie Done, [place] [phone]  Clinical Social Work

## 2012-05-11 NOTE — Evaluation (Signed)
Physical Therapy Evaluation Patient Details Name: Jenna Brown MRN: [id number] DOB: [birth year] Today's Date: 05/11/2012 Time: 0750-0821 PT Time Calculation (min): 31 min  PT Assessment / Plan / Recommendation Clinical Impression  Pt is a 61 y/o female admitted s/p right AKA along with the below PT problem list.  Pt would benefit from acute PT to maximize independence and facilitate d/c to SNF.    PT Assessment  Patient needs continued PT services    Follow Up Recommendations  Skilled nursing facility    Barriers to Discharge Decreased caregiver support Pt lives at home alone without assist.  Will need SNF.    Equipment Recommendations  Defer to next venue    Recommendations for Other Services     Frequency Min 3X/week    Precautions / Restrictions Precautions Precautions: Fall Restrictions Weight Bearing Restrictions: No   Pertinent Vitals/Pain No c/o with evaluation.      Mobility  Bed Mobility Bed Mobility: Supine to Sit Supine to Sit: 3: Mod assist;HOB flat Details for Bed Mobility Assistance: Assist for trunk to translate anterior due to posterior lean and facilitate rotation of pelvis to EOB.  Cues for sequence. Transfers Transfers: Sit to Stand;Stand to Sit;Stand Pivot Transfers Sit to Stand: 1: +2 Total assist;With upper extremity assist;From bed Sit to Stand: Patient Percentage: 70% Stand to Sit: 1: +2 Total assist;With upper extremity assist;To chair/3-in-1 Stand to Sit: Patient Percentage: 80% Stand Pivot Transfers: 1: +2 Total assist Stand Pivot Transfers: Patient Percentage: 70% Details for Transfer Assistance: Assist for balance and to translate trunk anterior while facilitating rotation of trunk OOB to chair.  Cues for sequence. Ambulation/Gait Ambulation/Gait Assistance: Not tested (comment) Stairs: No Wheelchair Mobility Wheelchair Mobility: No    Exercises     PT Diagnosis: Difficulty walking;Generalized weakness  PT Problem List: Decreased  strength;Decreased activity tolerance;Decreased balance;Decreased mobility;Decreased knowledge of use of DME;Impaired sensation PT Treatment Interventions: DME instruction;Gait training;Functional mobility training;Therapeutic activities;Balance training;Patient/family education   PT Goals Acute Rehab PT Goals PT Goal Formulation: With patient Time For Goal Achievement: 05/25/12 Potential to Achieve Goals: Good Pt will go Supine/Side to Sit: with supervision PT Goal: Supine/Side to Sit - Progress: Goal set today Pt will go Sit to Supine/Side: with supervision PT Goal: Sit to Supine/Side - Progress: Goal set today Pt will go Sit to Stand: with supervision PT Goal: Sit to Stand - Progress: Goal set today Pt will go Stand to Sit: with supervision PT Goal: Stand to Sit - Progress: Goal set today Pt will Transfer Bed to Chair/Chair to Bed: with supervision PT Transfer Goal: Bed to Chair/Chair to Bed - Progress: Goal set today Pt will Ambulate: 16 - 50 feet;with min assist;with least restrictive assistive device PT Goal: Ambulate - Progress: Goal set today  Visit Information  Last PT Received On: 05/11/12 Assistance Needed: +1    Subjective Data  Subjective: "It just feels tight." Patient Stated Goal: Get well.   Prior Functioning  Home Living Lives With: Alone Available Help at Discharge: Other (Comment) (Will d/c to SNF.) Type of Home: Apartment Home Access: Level entry Home Layout: One level Home Adaptive Equipment: Walker - rolling Prior Function Level of Independence: Independent with assistive device(s) (Using RW.) Able to Take Stairs?: Yes Driving: No Vocation: Retired Corporate investment banker: No difficulties    Cognition  Overall Cognitive Status: Appears within functional limits for tasks assessed/performed Arousal/Alertness: Awake/alert Orientation Level: Appears intact for tasks assessed Behavior During Session: [facility] for tasks performed    Extremity/Trunk  Assessment  Right Upper Extremity Assessment RUE ROM/Strength/Tone: Within functional levels RUE Sensation: WFL - Light Touch RUE Coordination: WFL - gross/fine motor Left Upper Extremity Assessment LUE ROM/Strength/Tone: Within functional levels LUE Sensation: WFL - Light Touch LUE Coordination: WFL - gross/fine motor Right Lower Extremity Assessment RLE ROM/Strength/Tone: Deficits RLE ROM/Strength/Tone Deficits: 2/5 RLE Sensation: Deficits RLE Sensation Deficits: Decreased to light touch with phantom sensation. RLE Coordination: WFL - gross motor Left Lower Extremity Assessment LLE ROM/Strength/Tone: Within functional levels LLE Sensation: WFL - Light Touch LLE Coordination: WFL - gross/fine motor Trunk Assessment Trunk Assessment: Normal   Balance Balance Balance Assessed: Yes Static Sitting Balance Static Sitting - Balance Support: Bilateral upper extremity supported;Feet supported Static Sitting - Level of Assistance: 4: Min assist Static Sitting - Comment/# of Minutes: Pt sat EOB for 5 minutes with up to min assist due to posterior lean.  Cues to shift weight anterior with pt progressing to min (guard).  End of Session PT - End of Session Equipment Utilized During Treatment: Gait belt Activity Tolerance: Patient tolerated treatment well Patient left: in chair;with call bell/phone within reach Nurse Communication: Mobility status  GP     Cyndia Bent 05/11/2012, 8:54 AM  05/11/2012 Cyndia Bent, PT, DPT [phone]

## 2012-05-11 NOTE — Progress Notes (Signed)
[facility] PROGRESS NOTE  Jenna Brown [id number] DOB: 1950-11-29 DOA: 05/09/2012 PCP: [name], MD  Assessment/Plan: Active Problems:  Stroke  Hypertension  Hyperlipidemia  PVD (peripheral vascular disease)  Chronic total occlusion of artery of the extremities  Anxiety  Anemia  1. DM: Patient is on SSI, glyburide, and linagliptin.  Currently blood sugars have been relatively well controlled.  Will continue to monitor and adjust medications pending blood sugars and advancement in diet.  Creatinine has improved and therefor on discharge would feel comfortable continuing patient's metformin but while in house would plan on holding. 2. HTN: Will plan on holding patient's cozaar. Will continue however her HCTZ, amlodipine. Monitor blood pressures and adjust medication pending readings. 3. HPL: Given history of PVD, HPL, and Stroke would plan on continuing patient's zocor 4. H/o Prior stroke: Continue zocor and blood pressure control.  5. Anxiety: Stable will continue xanax PRN. 6. RLE pain with total occlusion of artery of extremities/wound dehiscence: Vascular surgery on board and managing. Will defer antibiotic regimen, anticoagulation, and pain regimen to them. Pt is s/p day 1 R AKA. 7. Anemia: Pt is s/p recent operation and likely secondary to recent blood loss. Recheck CBC next am. Would consider Iron supplementation once patient is taking po. 8. Hypokalemia resolved.  Code Status: full Family Communication: Spoke with patient, brother and first cousin Disposition Plan: Pending continued clinical improvement   Brief narrative: Pt is a 61 y/o that was admitted secondary to nonhealing wound at RLE and discomfort. Pt has h/o PVD and was s/p right and posterior tibial bypass in July with early occlusion and revision with know graft occlusion. After evaluation of nonhealing wound it was decided that patient would require R AKA. Patient tolerated the procedure well  but post op had difficulty breathing and needed to be reintubated. Was able to be transitioned off of the ventilator and is currently awaiting transfer to stepdown.  Consultants:  (internal medicine) Procedures:  R AKA Antibiotics:  Levaquin  HPI/Subjective: Patient mentions that she feels much better.  The constant pain that was present prior to her procedure is now gone.  Denies any fever, chills, nausea, emesis  Objective: Filed Vitals:   05/11/12 0200 05/11/12 0400 05/11/12 0755 05/11/12 1122  BP: 155/83 141/70 153/73 135/80  Pulse: 110 106 111 112  Temp:  98.5 F (36.9 C) 98 F (36.7 C) 96.9 F (36.1 C)  TempSrc:  Oral  Oral  Resp: 26 22 26 22   Height:      Weight:      SpO2: 99% 100% 100% 96%    Intake/Output Summary (Last 24 hours) at 05/11/12 1804 Last data filed at 05/11/12 0856  Gross per 24 hour  Intake    370 ml  Output   1300 ml  Net   -930 ml   Filed Weights   05/09/12 2047 05/10/12 2015  Weight: 66.2 kg (145 lb 15.1 oz) 62.8 kg (138 lb 7.2 oz)    Exam:  General: Pt in NAD, laying supine in bed off of ventilator  Cardiovascular: RRR, No MRG  Respiratory: CTA BL, no wheezes  Abdomen: Soft, ND Extremities: R AKA no active bleeding  Data Reviewed: Basic Metabolic Panel:  Lab [id number] 0530 05/10/12 2137 05/10/12 1732 05/09/12 1609  NA 141 -- 139 142  K 4.1 -- 4.4 3.2*  CL 101 -- -- 99  CO2 26 -- -- 29  GLUCOSE 203* -- -- 86  BUN 14 -- -- 18  CREATININE  0.84 1.03 -- 1.36*  CALCIUM 9.2 -- -- 9.8  MG -- -- -- --  PHOS -- -- -- --   Liver Function Tests:  Lab 05/09/12 1609  AST 9  ALT 6  ALKPHOS 96  BILITOT 0.2*  PROT 7.5  ALBUMIN 2.8*   No results found for this basename: LIPASE:5,AMYLASE:5 in the last 168 hours No results found for this basename: AMMONIA:5 in the last 168 hours CBC:  Lab 05/11/12 0530 05/10/12 2137 05/10/12 1732 05/09/12 1609  WBC 16.9* 18.3* -- 16.9*  NEUTROABS -- -- -- --  HGB 8.4* 8.9* 8.5* 9.4*  HCT 26.1*  28.1* 25.0* 29.6*  MCV 79.3 80.1 -- 79.4  PLT 442* 470* -- 491*   Cardiac Enzymes: No results found for this basename: CKTOTAL:5,CKMB:5,CKMBINDEX:5,TROPONINI:5 in the last 168 hours BNP (last 3 results) No results found for this basename: PROBNP:3 in the last 8760 hours CBG:  Lab 05/11/12 1630 05/11/12 1128 05/11/12 0820 05/10/12 2048 05/10/12 1637  GLUCAP 142* 200* 201* 300* 199*    Recent Results (from the past 240 hour(s))  MRSA PCR SCREENING     Status: Normal   Collection Time   05/09/12  4:13 PM      Component Value Range Status Comment   MRSA by PCR NEGATIVE  NEGATIVE Final      Studies: Dg Chest Port 1 View  05/10/2012  *RADIOLOGY REPORT*  Clinical Data: Respiratory difficulty  PORTABLE CHEST - 1 VIEW  Comparison: 05/17/2011  Findings: Endotracheal tube placed.  Tip is 3.1 cm from the carina. Moderate gastric distention with gas.  Low lung volumes.  Lungs grossly clear.  No pneumothorax.  IMPRESSION: Endotracheal tube tip 3.1 cm from the carina.  Low volumes  Gastric distention with gas.   Original Report Authenticated By: Jamas Lav, M.D.     Scheduled Meds:   . amLODipine  10 mg Oral q morning - 10a  . atorvastatin  20 mg Oral q1800  . cefUROXime (ZINACEF)  IV  1.5 g Intravenous Q12H  . collagenase   Topical Daily  . docusate sodium  100 mg Oral Daily  . enoxaparin (LOVENOX) injection  40 mg Subcutaneous Q24H  . glyBURIDE  5 mg Oral BID WC  . hydrochlorothiazide  25 mg Oral q morning - 10a  . insulin aspart  0-15 Units Subcutaneous TID WC  . levofloxacin  500 mg Oral Daily  . linagliptin  5 mg Oral Daily  . pantoprazole  40 mg Oral Q1200  . senna  1 tablet Oral BID  . sodium chloride  3 mL Intravenous Q12H  . DISCONTD: collagenase   Topical Daily  . DISCONTD: enoxaparin (LOVENOX) injection  30 mg Subcutaneous Q24H  . DISCONTD: propofol  100 mcg/kg/min Intravenous Once  . DISCONTD: simvastatin  40 mg Oral Daily  . DISCONTD: sodium chloride  3 mL Intravenous  Q12H   Continuous Infusions:   . DISCONTD: sodium chloride 20 mL/hr at 05/10/12 1344    Active Problems:  Stroke  Hypertension  Hyperlipidemia  PVD (peripheral vascular disease)  Chronic total occlusion of artery of the extremities  Anxiety  Anemia    Time spent: > 30 minutes    Velvet Bathe  [facility] Pager [phone]. If 8PM-8AM, please contact night-coverage at [email or web address], password [facility] 05/11/2012, 6:04 PM  LOS: 2 days

## 2012-05-11 NOTE — Progress Notes (Signed)
PHARMACIST - PHYSICIAN COMMUNICATION DR:   Meredeth Ide.  CONCERNING:  Pt is on Norvasc and Zocor 40mg .  The dosage limit for Zocor is 20mg  in patients also receiving Norvasc due to increased risk of rhabdomyolysis.   RECOMMENDATION: Lipitor 20mg  has been substituted for Zocor; consider stopping Zocor on discharge and substituting Lipitor  Gracy Bruins, PharmD Clinical Pharmacist [facility]

## 2012-05-11 NOTE — Progress Notes (Signed)
Inpatient Diabetes Program Recommendations  AACE/ADA: New Consensus Statement on Inpatient Glycemic Control  Target Ranges:  Prepandial:   less than 140 mg/dL      Peak postprandial:   less than 180 mg/dL (1-2 hours)      Critically ill patients:  140 - 180 mg/dL  Pager:  [phone] Hours:  8 am-10pm   Reason for Visit: Elevated glucose in 200s and low yesterday when po diet inconsistent  Inpatient Diabetes Program Recommendations  Insulin - Basal: Add Lantus 15 units daily  Oral Agents: D/C Glyburide while inpatient   Courtney Heys PhD, RN, BC-ADM Diabetes Coordinator  Office:  [phone] Team Pager:  [phone]

## 2012-05-11 NOTE — Clinical Social Work Placement (Addendum)
Clinical Social Work Department CLINICAL SOCIAL WORK PLACEMENT NOTE 05/11/2012  Patient:  DELAYAH, LOU  Account Number:  [id number] Admit date:  05/09/2012  Clinical Social Worker:  Wylene Men  Date/time:  05/11/2012 09:22 AM  Clinical Social Work is seeking post-discharge placement for this patient at the following level of care:   SKILLED NURSING   (*CSW will update this form in Epic as items are completed)     Patient/family provided with [place] Department of Clinical Social Work's list of facilities offering this level of care within the geographic area requested by the patient (or if unable, by the patient's family).    Patient/family informed of their freedom to choose among providers that offer the needed level of care, that participate in Medicare, Medicaid or managed care program needed by the patient, have an available bed and are willing to accept the patient.    Patient/family informed of [facility]' ownership interest in [facility], as well as of the fact that they are under no obligation to receive care at this facility.  PASARR submitted to [facility] on  PASARR number received from [place] on   FL2 transmitted to all facilities in geographic area requested by pt/family on   FL2 transmitted to all facilities within larger geographic area on   Patient informed that his/her managed care company has contracts with or will negotiate with  certain facilities, including the following:     Patient/family informed of bed offers received:   Patient chooses bed at  Physician recommends and patient chooses bed at    Patient to be transferred to  on   Patient to be transferred to facility by   The following physician request were entered in Epic:   Additional Comments: pt will be returning to [place] upon d/c. Pt discharged to [facility] as planned.  Nonnie Done, [place] [phone]  Clinical Social Work

## 2012-05-11 NOTE — Evaluation (Signed)
Occupational Therapy Evaluation Patient Details Name: Jenna Brown MRN: [id number] DOB: Sep 16, 1951 Today's Date: 05/11/2012 Time: [id number] OT Time Calculation (min): 16 min  OT Assessment / Plan / Recommendation Clinical Impression  This 61 y.o. female admitted for Rt. AKA.  Pt. demonstrates the below listed deficits and will benefit from skilled OT to allow pt. to achieve modified independent level with BADLs after SNF level rehab    OT Assessment  Patient needs continued OT Services    Follow Up Recommendations  Skilled nursing facility    Barriers to Discharge Decreased caregiver support    Equipment Recommendations  Defer to next venue    Recommendations for Other Services    Frequency  Min 2X/week    Precautions / Restrictions Precautions Precautions: Fall Restrictions Weight Bearing Restrictions: No       ADL  Eating/Feeding: Performed;Independent Where Assessed - Eating/Feeding: Bed level Grooming: Simulated;Wash/dry hands;Wash/dry face;Teeth care;Supervision/safety Where Assessed - Grooming: Unsupported sitting Upper Body Bathing: Simulated;Supervision/safety Where Assessed - Upper Body Bathing: Unsupported sitting Lower Body Bathing: Simulated;Moderate assistance Where Assessed - Lower Body Bathing: Supported sit to stand Upper Body Dressing: Simulated;Supervision/safety Where Assessed - Upper Body Dressing: Unsupported sitting Lower Body Dressing: Simulated;Minimal assistance Where Assessed - Lower Body Dressing: Supported sit to stand Toilet Transfer: Simulated;Minimal assistance Toilet Transfer Method: Sit to stand Toileting - Clothing Manipulation and Hygiene: Simulated;Moderate assistance Where Assessed - Toileting Clothing Manipulation and Hygiene: Standing Transfers/Ambulation Related to ADLs: Min A  ADL Comments: Pt. very motivated.  pt. able to doff Lt. sock, and min A to don while EOB.   (Performed 10 reps seated "push ups" EOB)    OT Diagnosis:  Generalized weakness;Acute pain  OT Problem List: Decreased strength;Decreased activity tolerance;Impaired balance (sitting and/or standing);Decreased knowledge of use of DME or AE;Pain OT Treatment Interventions: Self-care/ADL training;Therapeutic exercise;DME and/or AE instruction;Therapeutic activities;Patient/family education;Balance training   OT Goals Acute Rehab OT Goals OT Goal Formulation: With patient Time For Goal Achievement: 05/25/12 Potential to Achieve Goals: Good ADL Goals Pt Will Perform Grooming: Other (comment);Standing at sink (MIN GUARD ASSIST) ADL Goal: Grooming - Progress: Goal set today Pt Will Perform Upper Body Bathing: with set-up;Sitting, edge of bed ADL Goal: Upper Body Bathing - Progress: Goal set today Pt Will Perform Lower Body Bathing: Sit to stand from chair;Sit to stand from bed (min guard assist) ADL Goal: Lower Body Bathing - Progress: Goal set today Pt Will Perform Upper Body Dressing: with set-up;Sitting, bed;Unsupported ADL Goal: Upper Body Dressing - Progress: Goal set today Pt Will Perform Lower Body Dressing: Sit to stand from bed;Supported;Sit to stand from chair (min guard assist) ADL Goal: Lower Body Dressing - Progress: Goal set today Pt Will Transfer to Toilet: 3-in-1;Stand pivot transfer (min gaurd assist) ADL Goal: Toilet Transfer - Progress: Goal set today Pt Will Perform Toileting - Clothing Manipulation: Standing (min guard assist) ADL Goal: Toileting - Clothing Manipulation - Progress: Goal set today ADL Goal: Toileting - Hygiene - Progress: Discontinued (comment) ADL Goal: Tub/Shower Transfer - Progress: Discontinued (comment)  Visit Information  Last OT Received On: 05/11/12 Assistance Needed: +1    Subjective Data  Subjective: "I'll try" Patient Stated Goal: To get better   Prior Functioning  Vision/Perception  Home Living Available Help at Discharge: [place] Prior Function Level of Independence: Needs  assistance Needs Assistance: Bathing Bath: Minimal Able to Take Stairs?: Yes Driving: No Vocation: Retired Corporate investment banker: No difficulties Dominant Hand: Left      Cognition  Overall Cognitive  Status: Appears within functional limits for tasks assessed/performed Arousal/Alertness: Awake/alert Orientation Level: Appears intact for tasks assessed Behavior During Session: [facility] for tasks performed    Extremity/Trunk Assessment Right Upper Extremity Assessment RUE ROM/Strength/Tone: Within functional levels RUE Coordination: WFL - gross/fine motor Left Upper Extremity Assessment LUE ROM/Strength/Tone: Within functional levels LUE Coordination: WFL - gross/fine motor Trunk Assessment Trunk Assessment: Normal   Mobility Bed Mobility Bed Mobility: Supine to Sit Supine to Sit: 4: Min assist;With rails Sit to Supine: 4: Min assist;With rail;HOB flat Details for Bed Mobility Assistance: assist to fully lift shoulders off the bed Transfers Transfers: Sit to Stand;Stand to Sit Sit to Stand: 4: Min assist;From bed;With upper extremity assist Stand to Sit: 4: Min assist;To bed;With upper extremity assist   Exercise    Balance Balance Balance Assessed: Yes Static Sitting Balance Static Sitting - Balance Support: No upper extremity supported Static Sitting - Level of Assistance: 5: Stand by assistance Dynamic Sitting Balance Dynamic Sitting - Balance Support: No upper extremity supported Dynamic Sitting - Level of Assistance: 5: Stand by assistance Dynamic Sitting - Balance Activities:  (LB ADL activities)  End of Session OT - End of Session Activity Tolerance: Patient tolerated treatment well Patient left: in bed;with call bell/phone within reach;with bed alarm set  [place], Ellard Artis M 05/11/2012, 5:50 PM

## 2012-05-12 LAB — CBC
HCT: 26.5 % — ABNORMAL LOW (ref 36.0–46.0)
Hemoglobin: 8.4 g/dL — ABNORMAL LOW (ref 12.0–15.0)
MCH: 25.1 pg — ABNORMAL LOW (ref 26.0–34.0)
MCHC: 31.7 g/dL (ref 30.0–36.0)
MCV: 79.3 fL (ref 78.0–100.0)

## 2012-05-12 LAB — BASIC METABOLIC PANEL
BUN: 10 mg/dL (ref 6–23)
CO2: 29 mEq/L (ref 19–32)
GFR calc non Af Amer: 77 mL/min — ABNORMAL LOW (ref >90)
Glucose, Bld: 187 mg/dL — ABNORMAL HIGH (ref 70–99)
Potassium: 3.6 mEq/L (ref 3.5–5.1)

## 2012-05-12 NOTE — Progress Notes (Signed)
[facility] PROGRESS NOTE  Jenna Brown [id number] DOB: [birth year] DOA: 05/09/2012 PCP: [name], MD  Assessment/Plan: Active Problems:  Stroke  Hypertension  Hyperlipidemia  PVD (peripheral vascular disease)  Chronic total occlusion of artery of the extremities  Anxiety  Anemia  DMII (diabetes mellitus, type 2)  1. DM: Patient is on SSI, glyburide, and linagliptin. Currently blood sugars have been relatively well controlled. Will continue to monitor and adjust medications pending blood sugars and advancement in diet (currently on liquid diet). Creatinine has improved and therefore on discharge would feel comfortable continuing patient's metformin but while in house would plan on holding. 2. HTN: Will plan on holding patient's cozaar. Will continue however her HCTZ, amlodipine. Monitor blood pressures and adjust medication pending readings. Patient had elevated blood pressures but suspect that this has been secondary to pain.  Currently pain is controlled and last BP reading 126/82. 3. HPL: Given history of PVD, HPL, and Stroke would plan on continuing patient's zocor 4. H/o Prior stroke: Continue zocor and blood pressure control.  5. Anxiety: Stable will continue xanax PRN. 6. RLE pain with total occlusion of artery of extremities/wound dehiscence: Vascular surgery on board and managing. Will defer antibiotic regimen, anticoagulation, and pain regimen to them. Pt is s/p day 2 R AKA. Physical therapy saw patient yesterday. 7. Anemia: Pt is s/p recent operation and likely secondary to recent blood loss. Started Iron supplements yesterday. H/h steady with no active bleeding currently. 8. Hypokalemia resolved.  Code Status: full  Family Communication: Spoke with patient, brother and first cousin  Disposition Plan: Pending continued clinical improvement   Brief narrative:  Pt is a 61 y/o that was admitted secondary to nonhealing wound at RLE and discomfort. Pt has h/o  PVD and was s/p right and posterior tibial bypass in July with early occlusion and revision with know graft occlusion. After evaluation of nonhealing wound it was decided that patient would require R AKA. Patient tolerated the procedure well but post op had difficulty breathing and needed to be reintubated. Was able to be transitioned off of the ventilator.  Is currently on floor post op breathing comfortably on room air.  Consultants:  (internal medicine) Procedures:  R AKA Antibiotics:  Levaquin HPI/Subjective: Pt mentions that she has had some discomfort.  Currently tolerable.  No acute issues reported overnight.  Objective: Filed Vitals:   05/11/12 2037 05/12/12 0527 05/12/12 0647 05/12/12 1314  BP: 124/78 180/81 126/70 126/82  Pulse: 119 130  107  Temp: 98.3 F (36.8 C) 97.5 F (36.4 C)  97.6 F (36.4 C)  TempSrc: Oral     Resp: 24 20  18   Height:      Weight:      SpO2: 94% 100%  100%    Intake/Output Summary (Last 24 hours) at 05/12/12 1512 Last data filed at 05/12/12 1300  Gross per 24 hour  Intake    360 ml  Output    150 ml  Net    210 ml   Filed Weights   05/09/12 2047 05/10/12 2015  Weight: 66.2 kg (145 lb 15.1 oz) 62.8 kg (138 lb 7.2 oz)    Exam:   General:  Pt in NAD, A and O x 3  Cardiovascular: RRR, No MRG  Respiratory: CTA BL, No wheezes  Abdomen: Soft, NT, ND  Extremities: R AKA no active bleeding.  Data Reviewed: Basic Metabolic Panel:  Lab [id number] 0550 05/11/12 0530 05/10/12 2137 05/10/12 1732 05/09/12 1609  NA  141 141 -- 139 142  K 3.6 4.1 -- 4.4 3.2*  CL 99 101 -- -- 99  CO2 29 26 -- -- 29  GLUCOSE 187* 203* -- -- 86  BUN 10 14 -- -- 18  CREATININE 0.82 0.84 1.03 -- 1.36*  CALCIUM 9.5 9.2 -- -- 9.8  MG -- -- -- -- --  PHOS -- -- -- -- --   Liver Function Tests:  Lab 05/09/12 1609  AST 9  ALT 6  ALKPHOS 96  BILITOT 0.2*  PROT 7.5  ALBUMIN 2.8*   No results found for this basename: LIPASE:5,AMYLASE:5 in the last 168  hours No results found for this basename: AMMONIA:5 in the last 168 hours CBC:  Lab 05/12/12 0550 05/11/12 0530 05/10/12 2137 05/10/12 1732 05/09/12 1609  WBC 15.0* 16.9* 18.3* -- 16.9*  NEUTROABS -- -- -- -- --  HGB 8.4* 8.4* 8.9* 8.5* 9.4*  HCT 26.5* 26.1* 28.1* 25.0* 29.6*  MCV 79.3 79.3 80.1 -- 79.4  PLT 438* 442* 470* -- 491*   Cardiac Enzymes: No results found for this basename: CKTOTAL:5,CKMB:5,CKMBINDEX:5,TROPONINI:5 in the last 168 hours BNP (last 3 results) No results found for this basename: PROBNP:3 in the last 8760 hours CBG:  Lab 05/12/12 0606 05/11/12 2126 05/11/12 1630 05/11/12 1128 05/11/12 0820  GLUCAP 181* 117* 142* 200* 201*    Recent Results (from the past 240 hour(s))  MRSA PCR SCREENING     Status: Normal   Collection Time   05/09/12  4:13 PM      Component Value Range Status Comment   MRSA by PCR NEGATIVE  NEGATIVE Final      Studies: Dg Chest Port 1 View  05/10/2012  *RADIOLOGY REPORT*  Clinical Data: Respiratory difficulty  PORTABLE CHEST - 1 VIEW  Comparison: 05/17/2011  Findings: Endotracheal tube placed.  Tip is 3.1 cm from the carina. Moderate gastric distention with gas.  Low lung volumes.  Lungs grossly clear.  No pneumothorax.  IMPRESSION: Endotracheal tube tip 3.1 cm from the carina.  Low volumes  Gastric distention with gas.   Original Report Authenticated By: Jamas Lav, M.D.     Scheduled Meds:   . amLODipine  10 mg Oral q morning - 10a  . atorvastatin  20 mg Oral q1800  . cefUROXime (ZINACEF)  IV  1.5 g Intravenous Q12H  . collagenase   Topical Daily  . docusate sodium  100 mg Oral Daily  . enoxaparin (LOVENOX) injection  40 mg Subcutaneous Q24H  . ferrous sulfate  325 mg Oral BID WC  . glyBURIDE  5 mg Oral BID WC  . hydrochlorothiazide  25 mg Oral q morning - 10a  . insulin aspart  0-15 Units Subcutaneous TID WC  . levofloxacin  500 mg Oral Daily  . linagliptin  5 mg Oral Daily  . pantoprazole  40 mg Oral Q1200  . senna  1  tablet Oral BID  . sodium chloride  3 mL Intravenous Q12H   Continuous Infusions:   Active Problems:  Stroke  Hypertension  Hyperlipidemia  PVD (peripheral vascular disease)  Chronic total occlusion of artery of the extremities  Anxiety  Anemia  DMII (diabetes mellitus, type 2)    Time spent: 30 minutes    Velvet Bathe  [facility] Pager [phone] If 8PM-8AM, please contact night-coverage at [email or web address], password [email or web address] 05/12/2012, 3:12 PM  LOS: 3 days

## 2012-05-12 NOTE — Progress Notes (Signed)
Subjective: Interval History: none..   Objective: Vital signs in last 24 hours: Temp:  [96.9 F (36.1 C)-98.3 F (36.8 C)] 97.5 F (36.4 C) (08/24 0527) Pulse Rate:  [112-130] 130  (08/24 0527) Resp:  [20-24] 20  (08/24 0527) BP: (124-180)/(70-81) 126/70 mmHg (08/24 0647) SpO2:  [94 %-100 %] 100 % (08/24 0527)  Intake/Output from previous day: 08/23 0701 - 08/24 0700 In: 240 [P.O.:240] Out: 200 [Urine:200] Intake/Output this shift: Total I/O In: 120 [P.O.:120] Out: 150 [Urine:150]  Dressing removed .  AKA healing well  Lab Results:  Basename 05/12/12 0550 05/11/12 0530  WBC 15.0* 16.9*  HGB 8.4* 8.4*  HCT 26.5* 26.1*  PLT 438* 442*   BMET  Basename 05/12/12 0550 05/11/12 0530  NA 141 141  K 3.6 4.1  CL 99 101  CO2 29 26  GLUCOSE 187* 203*  BUN 10 14  CREATININE 0.82 0.84  CALCIUM 9.5 9.2    Studies/Results: Dg Chest Port 1 View  05/10/2012  *RADIOLOGY REPORT*  Clinical Data: Respiratory difficulty  PORTABLE CHEST - 1 VIEW  Comparison: 05/17/2011  Findings: Endotracheal tube placed.  Tip is 3.1 cm from the carina. Moderate gastric distention with gas.  Low lung volumes.  Lungs grossly clear.  No pneumothorax.  IMPRESSION: Endotracheal tube tip 3.1 cm from the carina.  Low volumes  Gastric distention with gas.   Original Report Authenticated By: Jamas Lav, M.D.    Anti-infectives: Anti-infectives     Start     Dose/Rate Route Frequency Ordered Stop   05/11/12 0300   cefUROXime (ZINACEF) 1.5 g in dextrose 5 % 50 mL IVPB        1.5 g 100 mL/hr over 30 Minutes Intravenous Every 12 hours 05/10/12 2034 05/11/12 1603   05/10/12 1000   levofloxacin (LEVAQUIN) tablet 500 mg        500 mg Oral Daily 05/09/12 1530            Assessment/Plan: s/p Procedure(s) (LRB): AMPUTATION ABOVE KNEE (Right) Stable pod 2 PT and Rehab   LOS: 3 days   Lanayah Gartley 05/12/2012, 10:52 AM

## 2012-05-13 DIAGNOSIS — D72829 Elevated white blood cell count, unspecified: Secondary | ICD-10-CM

## 2012-05-13 DIAGNOSIS — R000 Tachycardia, unspecified: Secondary | ICD-10-CM

## 2012-05-13 LAB — CBC
HCT: 26.5 % — ABNORMAL LOW (ref 36.0–46.0)
Hemoglobin: 8.3 g/dL — ABNORMAL LOW (ref 12.0–15.0)
MCH: 25.1 pg — ABNORMAL LOW (ref 26.0–34.0)
MCV: 80.1 fL (ref 78.0–100.0)
RBC: 3.31 MIL/uL — ABNORMAL LOW (ref 3.87–5.11)

## 2012-05-13 LAB — GLUCOSE, CAPILLARY
Glucose-Capillary: 205 mg/dL — ABNORMAL HIGH (ref 70–99)
Glucose-Capillary: 216 mg/dL — ABNORMAL HIGH (ref 70–99)
Glucose-Capillary: 109 mg/dL — ABNORMAL HIGH (ref 70–99)

## 2012-05-13 MED ORDER — SODIUM CHLORIDE 0.9 % IV SOLN
INTRAVENOUS | Status: DC
  Administered 2012-05-13: 08:00:00 via INTRAVENOUS

## 2012-05-13 NOTE — Progress Notes (Signed)
[facility] PROGRESS NOTE  Jenna Brown [id number] DOB: 1950/10/12 DOA: 05/09/2012 PCP: [name], MD  Assessment/Plan: Active Problems:  Stroke  Hypertension  Hyperlipidemia  PVD (peripheral vascular disease)  Chronic total occlusion of artery of the extremities  Anxiety  Anemia  DMII (diabetes mellitus, type 2) Leukocytosis  1. Leukocytosis:  Likely from stress demargination from recent operation. Continues to trend down.  Patient is afebrile and on Levaquin.  Will plan on continuing and monitoring closely. 2. DM: Patient is on SSI, glyburide, and linagliptin. Currently blood sugars have been relatively well controlled. Will continue to monitor and adjust medications pending blood sugars and advancement in diet (currently on liquid diet). Creatinine has improved and therefore on discharge would feel comfortable continuing patient's metformin but while in house would plan on holding. 3. HTN: Will plan on holding patient's cozaar. Will continue however her HCTZ, amlodipine. Monitor blood pressures and adjust medication pending readings. Patient had elevated blood pressures but suspect that this has been secondary to pain. Currently pain is controlled and last BP reading 126/82. 4. HPL: Given history of PVD, HPL, and Stroke would plan on continuing patient's zocor 5. H/o Prior stroke: Continue zocor and blood pressure control.  6. Anxiety: Stable will continue xanax PRN. 7. RLE pain with total occlusion of artery of extremities/wound dehiscence: Vascular surgery on board and managing. Will defer antibiotic regimen, anticoagulation, and pain regimen to them. Pt is s/p day 3 R AKA. Physical therapy following. 8. Anemia: Pt is s/p recent operation and likely secondary to recent blood loss. Started Iron supplements 8/23. H/h steady with no active bleeding currently. 9. Hypokalemia resolved after repletion.  10. Tachycardia:  At this point suspect that patient may need more  volume and will start patient on normal saline.  She does have an elevated WBC but her oral intake has been limited and as such suspect she may need more fluids.  Will continue to monitor.  Code Status: full  Family Communication: Spoke with patient, brother and first cousin  Disposition Plan: Pending continued clinical improvement   Brief narrative:  Pt is a 61 y/o that was admitted secondary to nonhealing wound at RLE and discomfort. Pt has h/o PVD and was s/p right and posterior tibial bypass in July with early occlusion and revision with know graft occlusion. After evaluation of nonhealing wound it was decided that patient would require R AKA. Patient tolerated the procedure well but post op had difficulty breathing and needed to be reintubated. Was able to be transitioned off of the ventilator. Is currently on floor post op breathing comfortably on room air.  Consultants:  (internal medicine) Procedures:  R AKA Antibiotics:  Levaquin  HPI/Subjective: Pt with no new complaints today.  No acute issues overnight reported to me by patient.  Objective: Filed Vitals:   05/12/12 0647 05/12/12 1314 05/12/12 2034 05/13/12 0412  BP: 126/70 126/82 110/74 117/75  Pulse:  107 118 118  Temp:  97.6 F (36.4 C) 98.1 F (36.7 C) 98.6 F (37 C)  TempSrc:   Oral Oral  Resp:  18 18 18   Height:      Weight:      SpO2:  100% 95% 98%    Intake/Output Summary (Last 24 hours) at 05/13/12 0941 Last data filed at 05/12/12 1802  Gross per 24 hour  Intake    480 ml  Output      0 ml  Net    480 ml   [employer]  05/09/12 2047 05/10/12 2015  Weight: 66.2 kg (145 lb 15.1 oz) 62.8 kg (138 lb 7.2 oz)    Exam:  General: Pt in NAD, A and O x 3  Cardiovascular: RRR, No MRG  Respiratory: CTA BL, No wheezes  Abdomen: Soft, NT, ND  Extremities: R AKA no active bleeding.  Data Reviewed: Basic Metabolic Panel:  Lab [id number] 0550 05/11/12 0530 05/10/12 2137 05/10/12 1732 05/09/12 1609  NA 141  141 -- 139 142  K 3.6 4.1 -- 4.4 3.2*  CL 99 101 -- -- 99  CO2 29 26 -- -- 29  GLUCOSE 187* 203* -- -- 86  BUN 10 14 -- -- 18  CREATININE 0.82 0.84 1.03 -- 1.36*  CALCIUM 9.5 9.2 -- -- 9.8  MG -- -- -- -- --  PHOS -- -- -- -- --   Liver Function Tests:  Lab 05/09/12 1609  AST 9  ALT 6  ALKPHOS 96  BILITOT 0.2*  PROT 7.5  ALBUMIN 2.8*   No results found for this basename: LIPASE:5,AMYLASE:5 in the last 168 hours No results found for this basename: AMMONIA:5 in the last 168 hours CBC:  Lab 05/13/12 0500 05/12/12 0550 05/11/12 0530 05/10/12 2137 05/10/12 1732 05/09/12 1609  WBC 14.8* 15.0* 16.9* 18.3* -- 16.9*  NEUTROABS -- -- -- -- -- --  HGB 8.3* 8.4* 8.4* 8.9* 8.5* --  HCT 26.5* 26.5* 26.1* 28.1* 25.0* --  MCV 80.1 79.3 79.3 80.1 -- 79.4  PLT 444* 438* 442* 470* -- 491*   Cardiac Enzymes: No results found for this basename: CKTOTAL:5,CKMB:5,CKMBINDEX:5,TROPONINI:5 in the last 168 hours BNP (last 3 results) No results found for this basename: PROBNP:3 in the last 8760 hours CBG:  Lab 05/13/12 0615 05/12/12 2050 05/12/12 1615 05/12/12 0606 05/11/12 2126  GLUCAP 205* 217* 116* 181* 117*    Recent Results (from the past 240 hour(s))  MRSA PCR SCREENING     Status: Normal   Collection Time   05/09/12  4:13 PM      Component Value Range Status Comment   MRSA by PCR NEGATIVE  NEGATIVE Final      Studies: Dg Chest Port 1 View  05/10/2012  *RADIOLOGY REPORT*  Clinical Data: Respiratory difficulty  PORTABLE CHEST - 1 VIEW  Comparison: 05/17/2011  Findings: Endotracheal tube placed.  Tip is 3.1 cm from the carina. Moderate gastric distention with gas.  Low lung volumes.  Lungs grossly clear.  No pneumothorax.  IMPRESSION: Endotracheal tube tip 3.1 cm from the carina.  Low volumes  Gastric distention with gas.   Original Report Authenticated By: Jamas Lav, M.D.     Scheduled Meds:   . amLODipine  10 mg Oral q morning - 10a  . atorvastatin  20 mg Oral q1800  .  collagenase   Topical Daily  . docusate sodium  100 mg Oral Daily  . enoxaparin (LOVENOX) injection  40 mg Subcutaneous Q24H  . ferrous sulfate  325 mg Oral BID WC  . glyBURIDE  5 mg Oral BID WC  . hydrochlorothiazide  25 mg Oral q morning - 10a  . insulin aspart  0-15 Units Subcutaneous TID WC  . levofloxacin  500 mg Oral Daily  . linagliptin  5 mg Oral Daily  . pantoprazole  40 mg Oral Q1200  . senna  1 tablet Oral BID  . DISCONTD: sodium chloride  3 mL Intravenous Q12H   Continuous Infusions:   . sodium chloride 75 mL/hr at 05/13/12 [phone]  Active Problems:  Stroke  Hypertension  Hyperlipidemia  PVD (peripheral vascular disease)  Chronic total occlusion of artery of the extremities  Anxiety  Anemia  DMII (diabetes mellitus, type 2)    Time spent: > 30 minutes    Velvet Bathe  [facility] Pager [phone] If 8PM-8AM, please contact night-coverage at [email or web address], password [facility] 05/13/2012, 9:41 AM  LOS: 4 days

## 2012-05-13 NOTE — Plan of Care (Signed)
Problem: Phase III Progression Outcomes Goal: Activity at appropriate level-compared to baseline (UP IN CHAIR FOR HEMODIALYSIS)  Outcome: Progressing Gets up on the chair with PT help. Will benefit from rehab on transfers. Goal: Voiding independently Outcome: Completed/Met Date Met:  05/13/12 Denies burning or difficulty voiding. Goal: IV/normal saline lock discontinued Outcome: Not Progressing NS at 75. Not taking adeaquate PO fluids. Will encourage. Goal: Foley discontinued Outcome: Completed/Met Date Met:  05/13/12 Pt voiding independently adequately.

## 2012-05-13 NOTE — Progress Notes (Signed)
Subjective: Interval History: none..   Objective: Vital signs in last 24 hours: Temp:  [97.6 F (36.4 C)-98.6 F (37 C)] 98.6 F (37 C) (08/25 0412) Pulse Rate:  [107-118] 118  (08/25 0412) Resp:  [18] 18  (08/25 0412) BP: (110-126)/(74-82) 117/75 mmHg (08/25 0412) SpO2:  [95 %-100 %] 98 % (08/25 0412)  Intake/Output from previous day: 08/24 0701 - 08/25 0700 In: 600 [P.O.:600] Out: 150 [Urine:150] Intake/Output this shift: Total I/O In: 240 [P.O.:240] Out: 300 [Urine:300]  AKA dressing intact  Lab Results:  Basename 05/13/12 0500 05/12/12 0550  WBC 14.8* 15.0*  HGB 8.3* 8.4*  HCT 26.5* 26.5*  PLT 444* 438*   BMET  Basename 05/12/12 0550 05/11/12 0530  NA 141 141  K 3.6 4.1  CL 99 101  CO2 29 26  GLUCOSE 187* 203*  BUN 10 14  CREATININE 0.82 0.84  CALCIUM 9.5 9.2    Studies/Results: Dg Chest Port 1 View  05/10/2012  *RADIOLOGY REPORT*  Clinical Data: Respiratory difficulty  PORTABLE CHEST - 1 VIEW  Comparison: 05/17/2011  Findings: Endotracheal tube placed.  Tip is 3.1 cm from the carina. Moderate gastric distention with gas.  Low lung volumes.  Lungs grossly clear.  No pneumothorax.  IMPRESSION: Endotracheal tube tip 3.1 cm from the carina.  Low volumes  Gastric distention with gas.   Original Report Authenticated By: Jamas Lav, M.D.    Anti-infectives: Anti-infectives     Start     Dose/Rate Route Frequency Ordered Stop   05/11/12 0300   cefUROXime (ZINACEF) 1.5 g in dextrose 5 % 50 mL IVPB        1.5 g 100 mL/hr over 30 Minutes Intravenous Every 12 hours 05/10/12 2034 05/11/12 1603   05/10/12 1000   levofloxacin (LEVAQUIN) tablet 500 mg        500 mg Oral Daily 05/09/12 1530            Assessment/Plan: s/p Procedure(s) (LRB): AMPUTATION ABOVE KNEE (Right) Stable pod 3.  Rehab/PT   LOS: 4 days   Lemond Griffee 05/13/2012, 12:20 PM

## 2012-05-14 LAB — GLUCOSE, CAPILLARY
Glucose-Capillary: 105 mg/dL — ABNORMAL HIGH (ref 70–99)
Glucose-Capillary: 151 mg/dL — ABNORMAL HIGH (ref 70–99)

## 2012-05-14 MED ORDER — ATORVASTATIN CALCIUM 20 MG PO TABS: 20.0000 mg | ORAL_TABLET | Freq: Every day | ORAL | Status: DC

## 2012-05-14 MED ORDER — HYDROCODONE-ACETAMINOPHEN 5-500 MG PO TABS: 1.0000 | ORAL_TABLET | Freq: Four times a day (QID) | ORAL | Status: AC | PRN

## 2012-05-14 NOTE — Progress Notes (Addendum)
05/14/2012 7:27 AM POD 4  Subjective:  Wants milk and cereal  Tm 99.1 now afebrile   VSS Filed Vitals:   05/14/12 0534  BP: 114/74  Pulse: 110  Temp: 98.5 F (36.9 C)  Resp: 18    Physical Exam: Incisions:  C/d/i with staples in tact. Extremities:  No erythema  CBC    Component Value Date/Time   WBC 14.8* 05/13/2012 0500   RBC 3.31* 05/13/2012 0500   HGB 8.3* 05/13/2012 0500   HCT 26.5* 05/13/2012 0500   PLT 444* 05/13/2012 0500   MCV 80.1 05/13/2012 0500   MCH 25.1* 05/13/2012 0500   MCHC 31.3 05/13/2012 0500   RDW 13.6 05/13/2012 0500   LYMPHSABS 2.9 04/08/2012 0540   MONOABS 1.4* 04/08/2012 0540   EOSABS 0.1 04/08/2012 0540   BASOSABS 0.0 04/08/2012 0540    BMET    Component Value Date/Time   NA 141 05/12/2012 0550   K 3.6 05/12/2012 0550   CL 99 05/12/2012 0550   CO2 29 05/12/2012 0550   GLUCOSE 187* 05/12/2012 0550   BUN 10 05/12/2012 0550   CREATININE 0.82 05/12/2012 0550   CALCIUM 9.5 05/12/2012 0550   GFRNONAA 77* 05/12/2012 0550   GFRAA 90* 05/12/2012 0550    INR    Component Value Date/Time   INR 1.31 05/10/2012 0952     Intake/Output Summary (Last 24 hours) at 05/14/12 0727 Last data filed at 05/14/12 0500  Gross per 24 hour  Intake 1842.5 ml  Output    800 ml  Net 1042.5 ml     Assessment/Plan:  61 y.o. female is s/p right above knee amputation  POD 4  -order retention sock for AKA-right today -continue PT  Leontine Locket, PA-C [facility] [phone] 05/14/2012 7:27 AM  I have examined the patient, reviewed and agree with above.Pt has bed at SNF.  Wants Rehab eval for possible inpt stay here.  Will consult Rehab  Nashid Pellum, MD 05/14/2012 12:12 PM

## 2012-05-14 NOTE — Progress Notes (Signed)
Patient ID: Jenna Brown, female   DOB: Sep 27, 1950, 61 y.o.   MRN: [id number] [facility] PROGRESS NOTE  CELISE CLIATT [id number] DOB: 1951-06-11 DOA: 05/09/2012 PCP: [name], MD  Assessment/Plan: Active Problems:  Stroke  Hypertension  Hyperlipidemia  PVD (peripheral vascular disease)  Chronic total occlusion of artery of the extremities  Anxiety  Anemia  DMII (diabetes mellitus, type 2) Leukocytosis  Leukocytosis:  Likely from stress demargination from recent operation. Continues to trend down.  Patient is afebrile and on Levaquin (day 6)  DM: Patient is on SSI, glyburide, and linagliptin. Currently blood sugars have been relatively well controlled. Will continue to monitor and adjust medications pending blood sugars and advancement in diet (currently on liquid diet). Creatinine has improved and therefore on discharge would discontinue insulin and restart Metformin, but while in house will continue SSI.  HTN: Will plan on holding patient's cozaar. Will continue however her HCTZ, amlodipine. Monitor blood pressures and adjust medication pending readings. Patient had elevated blood pressures but suspect that this has been secondary to pain.   Will decrease IVF.  HPL: Given history of PVD, HPL, and Stroke would plan on continuing patient's zocor.  H/o Prior stroke: Continue zocor and blood pressure control.   Anxiety: Stable will continue xanax PRN.  RLE pain with total occlusion of artery of extremities/wound dehiscence: Vascular surgery on board and managing. Will defer antibiotic regimen, anticoagulation, and pain regimen to them. Pt is s/p day 4 R AKA. Physical therapy following.  Patient has requested [facility] inpatient rehab.  Anemia: Pt is s/p recent operation and likely secondary to recent blood loss. Started Iron supplements 8/23. H/h steady with no active bleeding currently.  Hypokalemia resolved after repletion.   Tachycardia:  At this point suspect that  tachycardia is due to pain and anxiety.  Pain reports her pain is a 5/10.  Code Status: full  Family Communication: Spoke with patient, brother and first cousin  Disposition Plan: Pending continued clinical improvement   Brief narrative:  Pt is a 61 y/o that was admitted secondary to nonhealing wound at RLE and discomfort. Pt has h/o PVD and was s/p right and posterior tibial bypass in July with early occlusion and revision with know graft occlusion. After evaluation of nonhealing wound it was decided that patient would require R AKA. Patient tolerated the procedure well but post op had difficulty breathing and needed to be reintubated. Was able to be transitioned off of the ventilator. Is currently on floor post op breathing comfortably on room air.  Consultants:  (internal medicine) Procedures:  R AKA Antibiotics:  Levaquin  HPI/Subjective: Pt with no new complaints today.  No acute issues overnight reported to me by patient.  Objective: Filed Vitals:   05/13/12 0412 05/13/12 1455 05/13/12 2021 05/14/12 0534  BP: 117/75 110/69 149/93 114/74  Pulse: 118 107 111 110  Temp: 98.6 F (37 C) 98.9 F (37.2 C) 99.1 F (37.3 C) 98.5 F (36.9 C)  TempSrc: Oral Oral Oral Oral  Resp: 18 18 18 18   Height:      Weight:      SpO2: 98% 96% 94% 95%    Intake/Output Summary (Last 24 hours) at 05/14/12 1253 Last data filed at 05/14/12 0900  Gross per 24 hour  Intake 1242.5 ml  Output    800 ml  Net  442.5 ml   Filed Weights   05/09/12 2047 05/10/12 2015  Weight: 66.2 kg (145 lb 15.1 oz) 62.8 kg (138 lb  7.2 oz)    Exam:  General: Pt in NAD, A and O x 3  Cardiovascular: RRR, No MRG  Respiratory: CTA BL, No wheezes  Abdomen: Soft, NT, ND  Extremities: R AKA no active bleeding.  Data Reviewed: Basic Metabolic Panel:  Lab [id number] 0550 05/11/12 0530 05/10/12 2137 05/10/12 1732 05/09/12 1609  NA 141 141 -- 139 142  K 3.6 4.1 -- 4.4 3.2*  CL 99 101 -- -- 99  CO2 29 26 -- -- 29    GLUCOSE 187* 203* -- -- 86  BUN 10 14 -- -- 18  CREATININE 0.82 0.84 1.03 -- 1.36*  CALCIUM 9.5 9.2 -- -- 9.8  MG -- -- -- -- --  PHOS -- -- -- -- --   Liver Function Tests:  Lab 05/09/12 1609  AST 9  ALT 6  ALKPHOS 96  BILITOT 0.2*  PROT 7.5  ALBUMIN 2.8*   CBC:  Lab 05/13/12 0500 05/12/12 0550 05/11/12 0530 05/10/12 2137 05/10/12 1732 05/09/12 1609  WBC 14.8* 15.0* 16.9* 18.3* -- 16.9*  NEUTROABS -- -- -- -- -- --  HGB 8.3* 8.4* 8.4* 8.9* 8.5* --  HCT 26.5* 26.5* 26.1* 28.1* 25.0* --  MCV 80.1 79.3 79.3 80.1 -- 79.4  PLT 444* 438* 442* 470* -- 491*  CBG:  Lab 05/14/12 1119 05/14/12 0622 05/13/12 2121 05/13/12 1610 05/13/12 1132  GLUCAP 151* 105* 103* 109* 216*    Recent Results (from the past 240 hour(s))  MRSA PCR SCREENING     Status: Normal   Collection Time   05/09/12  4:13 PM      Component Value Range Status Comment   MRSA by PCR NEGATIVE  NEGATIVE Final      Studies: Dg Chest Port 1 View  05/10/2012  *RADIOLOGY REPORT*  Clinical Data: Respiratory difficulty  PORTABLE CHEST - 1 VIEW  Comparison: 05/17/2011  Findings: Endotracheal tube placed.  Tip is 3.1 cm from the carina. Moderate gastric distention with gas.  Low lung volumes.  Lungs grossly clear.  No pneumothorax.  IMPRESSION: Endotracheal tube tip 3.1 cm from the carina.  Low volumes  Gastric distention with gas.   Original Report Authenticated By: Jamas Lav, M.D.     Scheduled Meds:    . amLODipine  10 mg Oral q morning - 10a  . atorvastatin  20 mg Oral q1800  . collagenase   Topical Daily  . docusate sodium  100 mg Oral Daily  . enoxaparin (LOVENOX) injection  40 mg Subcutaneous Q24H  . ferrous sulfate  325 mg Oral BID WC  . glyBURIDE  5 mg Oral BID WC  . hydrochlorothiazide  25 mg Oral q morning - 10a  . insulin aspart  0-15 Units Subcutaneous TID WC  . levofloxacin  500 mg Oral Daily  . linagliptin  5 mg Oral Daily  . pantoprazole  40 mg Oral Q1200  . senna  1 tablet Oral BID    Continuous Infusions:    . sodium chloride 75 mL/hr at 05/13/12 [id number]    Active Problems:  Stroke  Hypertension  Hyperlipidemia  PVD (peripheral vascular disease)  Chronic total occlusion of artery of the extremities  Anxiety  Anemia  DMII (diabetes mellitus, type 2)  Leukocytosis  Tachycardia    Time spent: > 30 minutes    Karen Kitchens [facility] Pager [phone] If 6 PM-8AM, please contact night-coverage at [email or web address], password [facility] 05/14/2012, 12:53 PM  LOS: 5 days  I have seen and independently evaluated and examined the patient myself and agree with above evaluation.  Please refer to above note for details.  Undray Allman, [employer]

## 2012-05-14 NOTE — Progress Notes (Signed)
PT Cancellation Note  Treatment cancelled today due to pt with rt leg pain.  Jenna Brown 05/14/2012, 1:48 PM

## 2012-05-14 NOTE — Progress Notes (Signed)
Orthopedic Tech Progress Note Patient Details:  Jenna Brown 1951/05/07 [id number]  Patient ID: [name], female   DOB: 02/19/1951, 61 y.o.   MRN: [id number] Called [email or web address]   Hildred Priest 05/14/2012, 2:30 PM

## 2012-05-14 NOTE — Discharge Summary (Signed)
[facility] Discharge Summary  Jenna Brown 07-Jan-1951 61 y.o. female  [id number]  Admission Date: 05/09/2012  Discharge Date: 05/14/12  Physician: [name], MD  Admission Diagnosis: Atherosclerosis of native arteries of the extremities with ulceration [440.23] PVD   HPI:   This is a 61 y.o. female who was  seen and examined the patient. I had a lengthy discussion with the patient and her daughter at the bedside. The patient has intractable pain to 2 for nonhealing wound on the right. The patient is actually requesting to proceed with amputation. She is not able to eat because of the pain she is having she is also not able to sleep.  On examination the eschar below the incision has increased in size. There is no surrounding erythema. The wound opening continues to remain very deep. I probed this with a Q-tip and it does go up to the tibial plateau.  I discussed that because of her blood supply I think the likelihood of this wound healing is very low. I am in agreement with proceeding with amputation. We talked about the level of amputation and because of how proximal the wound tracts I do not think that she is a good candidate for a below knee amputation as I think this will likely not heal. I have therefore recommended proceeding with a right above-knee amputation. The patient and her daughter are in agreement with this plan. This is been scheduled for later on today.   Hospital Course:  The patient was admitted to the hospital and taken to the operating room on 05/10/2012 and underwent right AKA.  The pt tolerated the procedure well and was transported to the PACU in good condition. She was transferred to the telemetry floor on POD 1.  Since the dosage limit for Zocor is 20mg  in pt also receiving Norvasc due to increased risk of rhabdomyolysis.  Lipitor 20mg  has been substituted for Zocor based on pharmacy's recommendations.  On POD 4, there was a SNF bed  available, but a CIR consult had not been obtained.  Will consult IP rehab.  She was evaluated by CIR and was not a candidate, so she will be sent back to her SNF facility.  The remainder of the hospital course consisted of increasing mobilization with PT and increasing intake of solids without difficulty.  CBC    Component Value Date/Time   WBC 14.8* 05/13/2012 0500   RBC 3.31* 05/13/2012 0500   HGB 8.3* 05/13/2012 0500   HCT 26.5* 05/13/2012 0500   PLT 444* 05/13/2012 0500   MCV 80.1 05/13/2012 0500   MCH 25.1* 05/13/2012 0500   MCHC 31.3 05/13/2012 0500   RDW 13.6 05/13/2012 0500   LYMPHSABS 2.9 04/08/2012 0540   MONOABS 1.4* 04/08/2012 0540   EOSABS 0.1 04/08/2012 0540   BASOSABS 0.0 04/08/2012 0540    BMET    Component Value Date/Time   NA 141 05/12/2012 0550   K 3.6 05/12/2012 0550   CL 99 05/12/2012 0550   CO2 29 05/12/2012 0550   GLUCOSE 187* 05/12/2012 0550   BUN 10 05/12/2012 0550   CREATININE 0.82 05/12/2012 0550   CALCIUM 9.5 05/12/2012 0550   GFRNONAA 77* 05/12/2012 0550   GFRAA 90* 05/12/2012 0550     Discharge Instructions:   The patient is discharged to home with extensive instructions on wound care and progressive ambulation.  They are instructed not to drive or perform any heavy lifting until returning to see the physician in his  office.  Discharge Orders    Future Appointments: Provider: Department: Dept Phone: Center:   05/28/2012 1:45 PM [name], MD [facility] [phone] [facility]     Future Orders Please Complete By Expires   Resume previous diet      Lifting restrictions      Comments:   No lifting for 6 weeks   Call MD for:  temperature >100.5      Call MD for:  redness, tenderness, or signs of infection (pain, swelling, bleeding, redness, odor or green/yellow discharge around incision site)      Call MD for:  severe or increased pain, loss or decreased feeling  in affected limb(s)      Discharge wound care:      Comments:   Shower daily with soap and  water starting 05/15/12      Discharge Diagnosis:  Atherosclerosis of native arteries of the extremities with ulceration [440.23] PVD  Secondary Diagnosis: Patient Active Problem List  Diagnosis  . Atherosclerosis of native arteries of the extremities with rest pain  . Stroke  . Hypertension  . Hyperlipidemia  . Myocardial infarction  . Peripheral vascular disease  . PVD (peripheral vascular disease)  . Atherosclerosis of native arteries of the extremities with intermittent claudication  . Wound dehiscence  . Chronic total occlusion of artery of the extremities  . Anxiety  . Anemia  . DMII (diabetes mellitus, type 2)  . Leukocytosis  . Tachycardia   Past Medical History  Diagnosis Date  . Hyperlipidemia     takes Simvastatin daily  . Peripheral vascular disease   . Stroke     TIA history  . PONV (postoperative nausea and vomiting)   . Hypertension     takes Amlodipine/HCTZ/Losartan daily  . Vertigo     takes Ativert prn  . Hemorrhoids   . Anxiety     takes Xanax prn  . Myocardial infarction 1990's  . Type II diabetes mellitus     takes Glucovance and Januvia daily  . H/O hiatal hernia   . GERD (gastroesophageal reflux disease)   . Headache     when b/p is elevated  . Migraines   . Arthritis       B[place]ley, [place]  Home Medication Instructions [id number]   Printed on:05/14/12 1218  Medication Information                    glyBURIDE-metformin (GLUCOVANCE) 5-500 MG per tablet Take 1 tablet by mouth 2 (two) times daily with a meal.            ALPRAZolam (XANAX) 0.25 MG tablet Take 0.25 mg by mouth daily as needed. For anxiety           meclizine (ANTIVERT) 25 MG tablet Take 25 mg by mouth daily as needed. For vertigo           losartan (COZAAR) 100 MG tablet Take 100 mg by mouth every morning.            hydrochlorothiazide (HYDRODIURIL) 25 MG tablet Take 25 mg by mouth every morning.            amLODipine (NORVASC) 10 MG tablet Take 10 mg by  mouth every morning.            sitaGLIPtin (JANUVIA) 100 MG tablet Take 100 mg by mouth every morning.           atorvastatin (LIPITOR) 20 MG tablet Take 1 tablet (20 mg total) by  mouth daily at 6 PM. (zocor d/c'd due to the dose she was on is contraindicated with Norvasc)          HYDROcodone-acetaminophen (VICODIN) 5-500 MG per tablet Take 1 tablet by mouth every 6 (six) hours as needed for pain. #30 NR            Disposition: SNF  Patient's condition: is Limited from AKA  Follow up: 1. Dr. [name] in 4 weeks from surgery    Leontine Locket, PA-C [facility] [phone] 05/14/2012  12:18 PM

## 2012-05-14 NOTE — Progress Notes (Signed)
Orthopedic Tech Progress Note Patient Details:  Jenna Brown 09-24-1950 [id number]  Patient ID: [name], female   DOB: 23-Sep-1950, 61 y.o.   MRN: [id number] Brace order completed by Warnell Forester, Jalani Cullifer 05/14/2012, 5:10 PM

## 2012-05-18 NOTE — ED Provider Notes (Addendum)
History     CSN: [id number]  Arrival date & time 05/09/12  1230   First MD Initiated Contact with Patient 05/09/12 1236      Chief Complaint  Patient presents with  . Extremity Pain     HPI Patient sent from nursing facility due to pain in right leg and abnormal drainage from surgical site.  Unsure of time.    Past Medical History  Diagnosis Date  . Hyperlipidemia     takes Simvastatin daily  . Peripheral vascular disease   . Stroke     TIA history  . PONV (postoperative nausea and vomiting)   . Hypertension     takes Amlodipine/HCTZ/Losartan daily  . Vertigo     takes Ativert prn  . Hemorrhoids   . Anxiety     takes Xanax prn  . Myocardial infarction 1990's  . Type II diabetes mellitus     takes Glucovance and Januvia daily  . H/O hiatal hernia   . GERD (gastroesophageal reflux disease)   . Headache     when b/p is elevated  . Migraines   . Arthritis     Past Surgical History  Procedure Date  . Cleft palate repair     several  . Pr vein bypass graft,aorto-fem-pop 05/27/11    Right SFA-Below knee Pop BP  . Tonsillectomy   . Dilation and curettage of uterus   . Aortogram 02/2012  . Colonoscopy   . Femoral-tibial bypass graft 03/29/2012    Procedure: BYPASS GRAFT FEMORAL-TIBIAL ARTERY;  Surgeon: [name], MD;  Location: [facility];  Service: Vascular;  Laterality: Right;  Right femoral to Posterior Tibialis with composite graft of 62mm x 80 cm ringed gortex graft and saphenous vein  ,intraoperative arteriogram  . Femoral-popliteal bypass graft 04/05/2012    Procedure: BYPASS GRAFT FEMORAL-POPLITEAL ARTERY;  Surgeon: [name], MD;  Location: [facility];  Service: Vascular;  Laterality: Right;  REVISION  . Myomectomy   . Tubal ligation ~ 1990  . Femoropopliteal thrombectomy / embolectomy 05/09/2012  . Amputation 05/10/2012    Procedure: AMPUTATION ABOVE KNEE;  Surgeon: [name], MD;  Location: [facility] OR;  Service: Vascular;  Laterality: Right;   open right  groin wound noted    Family History  Problem Relation Age of Onset  . Cancer Mother     breast  . Diabetes Father     History  Substance Use Topics  . Smoking status: Former Smoker -- 0.2 packs/day for 40 years    Types: Cigarettes    Quit date: 02/27/2012  . Smokeless tobacco: Never Used  . Alcohol Use: No    OB History    Grav Para Term Preterm Abortions TAB SAB Ect Mult Living                  Review of Systems  All other systems reviewed and are negative.    Allergies  Plavix; Codeine; Tylenol; and Tylenol with codeine #3  Home Medications   Current Outpatient Rx  Name Route Sig Dispense Refill  . ALPRAZOLAM 0.25 MG PO TABS Oral Take 0.25 mg by mouth daily as needed. For anxiety    . AMLODIPINE BESYLATE 10 MG PO TABS Oral Take 10 mg by mouth every morning.     Marland Kitchen GLYBURIDE-METFORMIN 5-500 MG PO TABS Oral Take 1 tablet by mouth 2 (two) times daily with a meal.     . HYDROCHLOROTHIAZIDE 25 MG PO TABS Oral Take 25 mg by mouth  every morning.     Marland Kitchen LOSARTAN POTASSIUM 100 MG PO TABS Oral Take 100 mg by mouth every morning.     Marland Kitchen MECLIZINE HCL 25 MG PO TABS Oral Take 25 mg by mouth daily as needed. For vertigo    . SITAGLIPTIN PHOSPHATE 100 MG PO TABS Oral Take 100 mg by mouth every morning.    . ATORVASTATIN CALCIUM 20 MG PO TABS Oral Take 1 tablet (20 mg total) by mouth daily at 6 PM. 30 tablet 2    Refills per PCP or cardiologist  . HYDROCODONE-ACETAMINOPHEN 5-500 MG PO TABS Oral Take 1 tablet by mouth every 6 (six) hours as needed for pain. 30 tablet 0    BP 134/76  Pulse 101  Temp 98.6 F (37 C) (Oral)  Resp 18  Ht 5\' 4"  (1.626 m)  Wt 138 lb 7.2 oz (62.8 kg)  BMI 23.76 kg/m2  SpO2 100%  Physical Exam  Nursing note and vitals reviewed. Constitutional: She is oriented to person, place, and time. She appears well-developed. No distress.  HENT:  Head: Normocephalic and atraumatic.  Eyes: Pupils are equal, round, and reactive to light.  Neck: Normal range  of motion.  Cardiovascular: Normal rate and intact distal pulses.   Pulmonary/Chest: No respiratory distress.  Abdominal: Normal appearance. She exhibits no distension.  Musculoskeletal:       Right lower leg: She exhibits tenderness. She exhibits no deformity.       Legs: Neurological: She is alert and oriented to person, place, and time. No cranial nerve deficit.  Skin: Skin is warm and dry. No rash noted.  Psychiatric: She has a normal mood and affect. Her behavior is normal.    ED Course  Procedures (including critical care time)  Vascular surgery consulted.  Came to ED and admitted the patient.  Labs Reviewed  CBC - Abnormal; Notable for the following:    WBC 16.9 (*)     RBC 3.73 (*)     Hemoglobin 9.4 (*)     HCT 29.6 (*)     MCH 25.2 (*)     Platelets 491 (*)     All other components within normal limits  COMPREHENSIVE METABOLIC PANEL - Abnormal; Notable for the following:    Potassium 3.2 (*)     Creatinine, Ser 1.36 (*)     Albumin 2.8 (*)     Total Bilirubin 0.2 (*)     GFR calc non Af Amer 42 (*)     GFR calc Af Amer 49 (*)     All other components within normal limits  PROTIME-INR - Abnormal; Notable for the following:    Prothrombin Time 19.1 (*)     INR 1.57 (*)     All other components within normal limits  URINALYSIS, ROUTINE W REFLEX MICROSCOPIC - Abnormal; Notable for the following:    APPearance CLOUDY (*)     All other components within normal limits  HEMOGLOBIN A1C - Abnormal; Notable for the following:    Hemoglobin A1C 7.4 (*)     Mean Plasma Glucose 166 (*)     All other components within normal limits  GLUCOSE, CAPILLARY - Abnormal; Notable for the following:    Glucose-Capillary 101 (*)     All other components within normal limits  GLUCOSE, CAPILLARY - Abnormal; Notable for the following:    Glucose-Capillary 64 (*)     All other components within normal limits  PROTIME-INR - Abnormal; Notable for the following:  Prothrombin Time 16.5  (*)     All other components within normal limits  GLUCOSE, CAPILLARY - Abnormal; Notable for the following:    Glucose-Capillary 65 (*)     All other components within normal limits  GLUCOSE, CAPILLARY - Abnormal; Notable for the following:    Glucose-Capillary 160 (*)     All other components within normal limits  GLUCOSE, CAPILLARY - Abnormal; Notable for the following:    Glucose-Capillary 199 (*)     All other components within normal limits  CBC - Abnormal; Notable for the following:    WBC 16.9 (*)     RBC 3.29 (*)     Hemoglobin 8.4 (*)     HCT 26.1 (*)     MCH 25.5 (*)     Platelets 442 (*)     All other components within normal limits  BASIC METABOLIC PANEL - Abnormal; Notable for the following:    Glucose, Bld 203 (*)     GFR calc non Af Amer 75 (*)     GFR calc Af Amer 87 (*)     All other components within normal limits  POCT I-STAT 7, (LYTES, BLD GAS, ICA,H+H) - Abnormal; Notable for the following:    pH, Arterial 7.603 (*)     pCO2 arterial 27.3 (*)     pO2, Arterial 359.0 (*)     Bicarbonate 27.2 (*)     Acid-Base Excess 5.0 (*)     Calcium, Ion 1.07 (*)     HCT 25.0 (*)     Hemoglobin 8.5 (*)     All other components within normal limits  CBC - Abnormal; Notable for the following:    WBC 18.3 (*)     RBC 3.51 (*)     Hemoglobin 8.9 (*)     HCT 28.1 (*)     MCH 25.4 (*)     Platelets 470 (*)     All other components within normal limits  CREATININE, SERUM - Abnormal; Notable for the following:    GFR calc non Af Amer 59 (*)     GFR calc Af Amer 68 (*)     All other components within normal limits  GLUCOSE, CAPILLARY - Abnormal; Notable for the following:    Glucose-Capillary 300 (*)     All other components within normal limits  GLUCOSE, CAPILLARY - Abnormal; Notable for the following:    Glucose-Capillary 201 (*)     All other components within normal limits  GLUCOSE, CAPILLARY - Abnormal; Notable for the following:    Glucose-Capillary 200 (*)       All other components within normal limits  GLUCOSE, CAPILLARY - Abnormal; Notable for the following:    Glucose-Capillary 142 (*)     All other components within normal limits  BASIC METABOLIC PANEL - Abnormal; Notable for the following:    Glucose, Bld 187 (*)     GFR calc non Af Amer 77 (*)     GFR calc Af Amer 90 (*)     All other components within normal limits  CBC - Abnormal; Notable for the following:    WBC 15.0 (*)     RBC 3.34 (*)     Hemoglobin 8.4 (*)     HCT 26.5 (*)     MCH 25.1 (*)     Platelets 438 (*)     All other components within normal limits  GLUCOSE, CAPILLARY - Abnormal; Notable for the following:  Glucose-Capillary 117 (*)     All other components within normal limits  GLUCOSE, CAPILLARY - Abnormal; Notable for the following:    Glucose-Capillary 181 (*)     All other components within normal limits  GLUCOSE, CAPILLARY - Abnormal; Notable for the following:    Glucose-Capillary 116 (*)     All other components within normal limits  CBC - Abnormal; Notable for the following:    WBC 14.8 (*)     RBC 3.31 (*)     Hemoglobin 8.3 (*)     HCT 26.5 (*)     MCH 25.1 (*)     Platelets 444 (*)     All other components within normal limits  GLUCOSE, CAPILLARY - Abnormal; Notable for the following:    Glucose-Capillary 217 (*)     All other components within normal limits  GLUCOSE, CAPILLARY - Abnormal; Notable for the following:    Glucose-Capillary 205 (*)     All other components within normal limits  GLUCOSE, CAPILLARY - Abnormal; Notable for the following:    Glucose-Capillary 216 (*)     All other components within normal limits  GLUCOSE, CAPILLARY - Abnormal; Notable for the following:    Glucose-Capillary 109 (*)     All other components within normal limits  GLUCOSE, CAPILLARY - Abnormal; Notable for the following:    Glucose-Capillary 103 (*)     All other components within normal limits  GLUCOSE, CAPILLARY - Abnormal; Notable for the  following:    Glucose-Capillary 105 (*)     All other components within normal limits  GLUCOSE, CAPILLARY - Abnormal; Notable for the following:    Glucose-Capillary 151 (*)     All other components within normal limits  GLUCOSE, CAPILLARY - Abnormal; Notable for the following:    Glucose-Capillary 134 (*)     All other components within normal limits  MRSA PCR SCREENING  GLUCOSE, CAPILLARY  GLUCOSE, CAPILLARY  GLUCOSE, CAPILLARY  SURGICAL PATHOLOGY  LAB REPORT - SCANNED   No results found.   1. Atherosclerosis of native arteries of the extremities with ulceration   2. Anxiety   3. Hyperlipidemia   4. Hypertension   5. Peripheral vascular disease   6. Stroke   7. Anemia   8. Atherosclerosis of native arteries of the extremities with rest pain   9. PVD (peripheral vascular disease)   10. DMII (diabetes mellitus, type 2)   11. Leukocytosis   12. Tachycardia       MDM          Dot Lanes, MD 05/18/12 1156  Dot Lanes, MD 06/14/12 [phone]

## 2012-05-21 NOTE — Discharge Summary (Signed)
Agree with the above  Jenna Brown 

## 2012-05-25 ENCOUNTER — Encounter: Payer: Self-pay | Admitting: Surgery

## 2012-05-28 ENCOUNTER — Ambulatory Visit: Payer: Medicaid Other | Admitting: Surgery

## 2012-06-08 ENCOUNTER — Encounter: Payer: Self-pay | Admitting: Surgery

## 2012-06-11 ENCOUNTER — Encounter: Payer: Self-pay | Admitting: Surgery

## 2012-06-11 ENCOUNTER — Ambulatory Visit (INDEPENDENT_AMBULATORY_CARE_PROVIDER_SITE_OTHER): Payer: Medicaid Other | Admitting: Surgery

## 2012-06-11 VITALS — BP 122/66 | HR 106 | Temp 98.9°F | Ht 64.0 in | Wt 137.0 lb

## 2012-06-11 DIAGNOSIS — I70219 Atherosclerosis of native arteries of extremities with intermittent claudication, unspecified extremity: Secondary | ICD-10-CM

## 2012-06-11 NOTE — Progress Notes (Signed)
The patient is here today for followup. 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 Her staples have been removed. She has had a significant improvement over the past couple weeks. Her appetite is returning. Her energy level has increased. She was able to bathe and dress herself today.    There remains a small right groin incisional wound. There is pink granulation tissue at the base. It is nearly healed. Her stump is healing nicely. There is a small skin defect with pink granulation tissue on the lateral side.  Overall I am very pleased with her progress. I'm scheduling her to come back to see me in 6 months. She'll contact me sooner if she has issues.

## 2012-10-29 ENCOUNTER — Ambulatory Visit: Payer: Medicaid Other | Attending: Surgery | Admitting: Physical Therapy

## 2012-10-29 DIAGNOSIS — S78119A Complete traumatic amputation at level between unspecified hip and knee, initial encounter: Secondary | ICD-10-CM | POA: Insufficient documentation

## 2012-10-29 DIAGNOSIS — R269 Unspecified abnormalities of gait and mobility: Secondary | ICD-10-CM | POA: Insufficient documentation

## 2012-10-29 DIAGNOSIS — M6281 Muscle weakness (generalized): Secondary | ICD-10-CM | POA: Insufficient documentation

## 2012-10-29 DIAGNOSIS — R5381 Other malaise: Secondary | ICD-10-CM | POA: Insufficient documentation

## 2012-10-29 DIAGNOSIS — IMO0001 Reserved for inherently not codable concepts without codable children: Secondary | ICD-10-CM | POA: Insufficient documentation

## 2012-11-06 ENCOUNTER — Ambulatory Visit: Payer: Medicaid Other | Admitting: Physical Therapy

## 2012-11-12 ENCOUNTER — Ambulatory Visit: Payer: Medicaid Other | Admitting: Physical Therapy

## 2012-11-13 ENCOUNTER — Encounter: Payer: Medicaid Other | Admitting: Physical Therapy

## 2012-11-20 ENCOUNTER — Ambulatory Visit: Payer: Medicaid Other | Admitting: Physical Therapy

## 2012-11-27 ENCOUNTER — Ambulatory Visit: Payer: Medicaid Other | Attending: Surgery | Admitting: Physical Therapy

## 2012-11-27 DIAGNOSIS — R269 Unspecified abnormalities of gait and mobility: Secondary | ICD-10-CM | POA: Insufficient documentation

## 2012-11-27 DIAGNOSIS — IMO0001 Reserved for inherently not codable concepts without codable children: Secondary | ICD-10-CM | POA: Insufficient documentation

## 2012-11-27 DIAGNOSIS — M6281 Muscle weakness (generalized): Secondary | ICD-10-CM | POA: Insufficient documentation

## 2012-11-27 DIAGNOSIS — R5381 Other malaise: Secondary | ICD-10-CM | POA: Insufficient documentation

## 2012-11-27 DIAGNOSIS — S78119A Complete traumatic amputation at level between unspecified hip and knee, initial encounter: Secondary | ICD-10-CM | POA: Insufficient documentation

## 2012-11-29 ENCOUNTER — Ambulatory Visit: Payer: Medicaid Other | Admitting: Physical Therapy

## 2012-11-30 ENCOUNTER — Ambulatory Visit: Payer: Medicaid Other | Admitting: Physical Therapy

## 2012-12-04 ENCOUNTER — Ambulatory Visit: Payer: Medicaid Other | Admitting: Physical Therapy

## 2012-12-07 ENCOUNTER — Ambulatory Visit: Payer: Medicaid Other | Admitting: Physical Therapy

## 2012-12-10 ENCOUNTER — Ambulatory Visit: Payer: Medicaid Other | Admitting: Surgery

## 2012-12-11 ENCOUNTER — Ambulatory Visit: Payer: Medicaid Other | Admitting: Physical Therapy

## 2012-12-13 ENCOUNTER — Ambulatory Visit: Payer: Medicaid Other | Admitting: Physical Therapy

## 2012-12-14 ENCOUNTER — Encounter: Payer: Self-pay | Admitting: Surgery

## 2012-12-17 ENCOUNTER — Encounter: Payer: Self-pay | Admitting: Surgery

## 2012-12-17 ENCOUNTER — Ambulatory Visit (INDEPENDENT_AMBULATORY_CARE_PROVIDER_SITE_OTHER): Payer: Medicaid Other | Admitting: Surgery

## 2012-12-17 VITALS — BP 130/83 | HR 102 | Ht 64.0 in | Wt 140.0 lb

## 2012-12-17 DIAGNOSIS — I739 Peripheral vascular disease, unspecified: Secondary | ICD-10-CM | POA: Insufficient documentation

## 2012-12-17 MED ORDER — FLUCONAZOLE 200 MG PO TABS: 200.0000 mg | ORAL_TABLET | Freq: Every day | ORAL | Status: DC

## 2012-12-17 NOTE — Progress Notes (Signed)
[facility] [place]   Patient name: Jenna Brown MRN: [id number] DOB: [birth year] Sex: female     Chief Complaint  Patient presents with  . Re-evaluation    6 month f/u pt c/o a rash in right groin X3 days    HISTORY OF PRESENT ILLNESS: The patient is back today for followup. She underwent multiple attempts at revascularization of her right leg for nonhealing wound. On 05/10/2012, she ultimately required a right above-knee amputation. She has done very well since I had last seen her. She is now using a prosthetic leg and undergoing therapy twice a week. She is not fully ambulatory on her leg but is making good progress. She is in good spirits today. She does complain of a right inguinal rash x3 days.  Past Medical History  Diagnosis Date  . Hyperlipidemia     takes Simvastatin daily  . Peripheral vascular disease   . Stroke     TIA history  . PONV (postoperative nausea and vomiting)   . Hypertension     takes Amlodipine/HCTZ/Losartan daily  . Vertigo     takes Ativert prn  . Hemorrhoids   . Anxiety     takes Xanax prn  . Myocardial infarction 1990's  . Type II diabetes mellitus     takes Glucovance and Januvia daily  . H/O hiatal hernia   . GERD (gastroesophageal reflux disease)   . Headache     when b/p is elevated  . Migraines   . Arthritis     Past Surgical History  Procedure Laterality Date  . Cleft palate repair      several  . Pr vein bypass graft,aorto-fem-pop  05/27/11    Right SFA-Below knee Pop BP  . Tonsillectomy    . Dilation and curettage of uterus    . Aortogram  02/2012  . Colonoscopy    . Femoral-tibial bypass graft  03/29/2012    Procedure: BYPASS GRAFT FEMORAL-TIBIAL ARTERY;  Surgeon: [name], MD;  Location: [facility];  Service: Vascular;  Laterality: Right;  Right femoral to Posterior Tibialis with composite graft of 29mm x 80 cm ringed gortex graft and saphenous vein  ,intraoperative arteriogram  . Femoral-popliteal bypass  graft  04/05/2012    Procedure: BYPASS GRAFT FEMORAL-POPLITEAL ARTERY;  Surgeon: [name], MD;  Location: [facility];  Service: Vascular;  Laterality: Right;  REVISION  . Myomectomy    . Tubal ligation  ~ 1990  . Femoropopliteal thrombectomy / embolectomy  05/09/2012  . Amputation  05/10/2012    Procedure: AMPUTATION ABOVE KNEE;  Surgeon: [name], MD;  Location: [facility] OR;  Service: Vascular;  Laterality: Right;   open right groin wound noted    History   Social History  . Marital Status: Single    Spouse Name: N/A    Number of Children: N/A  . Years of Education: N/A   Occupational History  . Not on file.   Social History Main Topics  . Smoking status: Former Smoker -- 0.25 packs/day for 40 years    Types: Cigarettes    Quit date: 02/27/2012  . Smokeless tobacco: Never Used  . Alcohol Use: No  . Drug Use: No  . Sexually Active: No   Other Topics Concern  . Not on file   Social History Narrative  . No narrative on file    Family History  Problem Relation Age of Onset  . Cancer Mother     breast  .  Diabetes Father     Allergies as of 12/17/2012 - Review Complete 12/17/2012  Allergen Reaction Noted  . Plavix (clopidogrel bisulfate) Itching 02/29/2012  . Codeine Other (See Comments) 05/25/2011  . Tylenol (acetaminophen) Other (See Comments) 05/25/2011  . Tylenol with codeine #3 (acetaminophen-codeine) Other (See Comments) 02/28/2012    Current Outpatient Prescriptions on File Prior to Visit  Medication Sig Dispense Refill  . acetaminophen (TYLENOL) 325 MG tablet Take 325 mg by mouth every 6 (six) hours as needed.      . ALPRAZolam (XANAX) 0.25 MG tablet Take 0.25 mg by mouth daily as needed. For anxiety      . amLODipine (NORVASC) 10 MG tablet Take 10 mg by mouth every morning.       Marland Kitchen atorvastatin (LIPITOR) 20 MG tablet Take 1 tablet (20 mg total) by mouth daily at 6 PM.  30 tablet  2  . glyBURIDE-metformin (GLUCOVANCE) 5-500 MG per tablet Take 1 tablet by  mouth 2 (two) times daily with a meal.       . hydrochlorothiazide (HYDRODIURIL) 25 MG tablet Take 25 mg by mouth every morning.       . Insulin Aspart (NOVOLOG [place]) Inject into the skin. As directed      . losartan (COZAAR) 100 MG tablet Take 100 mg by mouth every morning.       . meclizine (ANTIVERT) 25 MG tablet Take 25 mg by mouth daily as needed. For vertigo      . Multiple Vitamins-Minerals (ZINC PO) Take 220 mg by mouth daily.      . Rivaroxaban (XARELTO) 20 MG TABS Take 20 mg by mouth daily.      . sitaGLIPtin (JANUVIA) 100 MG tablet Take 100 mg by mouth every morning.      . vitamin C (ASCORBIC ACID) 500 MG tablet Take 500 mg by mouth daily.       No current facility-administered medications on file prior to visit.     REVIEW OF SYSTEMS: Please see history of present illness, otherwise all systems are negative  PHYSICAL EXAMINATION:   Vital signs are BP 130/83  Pulse 102  Ht 5\' 4"  (1.626 m)  Wt 140 lb (63.504 kg)  BMI 24.02 kg/m2  SpO2 100% General: The patient appears their stated age. HEENT:  No gross abnormalities Pulmonary:  Non labored breathing Abdomen: Soft and non-tender Musculoskeletal: Right above-knee amputation well healed Neurologic: No focal weakness or paresthesias are detected, Skin: Damp right groin with slight blistering Psychiatric: The patient has normal affect. Cardiovascular: There is a regular rate and rhythm without significant murmur appreciated.   Diagnostic Studies None  Assessment: Status post right above-the-knee dictation Plan: The patient is doing very well from her amputation perspective. I have encouraged her to continue with ambulation. She'll followup in one year.  I have given her a prescription for Diflucan for yeast infection  V. [name], M.D. [facility] [place] Office: [phone] Pager:  [phone]

## 2012-12-18 ENCOUNTER — Ambulatory Visit: Payer: Medicaid Other | Attending: Surgery | Admitting: Physical Therapy

## 2012-12-18 DIAGNOSIS — R269 Unspecified abnormalities of gait and mobility: Secondary | ICD-10-CM | POA: Insufficient documentation

## 2012-12-18 DIAGNOSIS — IMO0001 Reserved for inherently not codable concepts without codable children: Secondary | ICD-10-CM | POA: Insufficient documentation

## 2012-12-18 DIAGNOSIS — S78119A Complete traumatic amputation at level between unspecified hip and knee, initial encounter: Secondary | ICD-10-CM | POA: Insufficient documentation

## 2012-12-18 DIAGNOSIS — R5381 Other malaise: Secondary | ICD-10-CM | POA: Insufficient documentation

## 2012-12-18 DIAGNOSIS — M6281 Muscle weakness (generalized): Secondary | ICD-10-CM | POA: Insufficient documentation

## 2012-12-20 ENCOUNTER — Ambulatory Visit: Payer: Medicaid Other | Admitting: Physical Therapy

## 2012-12-25 ENCOUNTER — Ambulatory Visit: Payer: Medicaid Other | Admitting: Physical Therapy

## 2012-12-27 ENCOUNTER — Ambulatory Visit: Payer: Medicaid Other | Admitting: Physical Therapy

## 2013-01-01 ENCOUNTER — Ambulatory Visit: Payer: Medicaid Other | Admitting: Physical Therapy

## 2013-01-03 ENCOUNTER — Ambulatory Visit: Payer: Medicaid Other | Admitting: Physical Therapy

## 2013-01-09 ENCOUNTER — Ambulatory Visit: Payer: Medicaid Other | Admitting: Physical Therapy

## 2013-01-10 ENCOUNTER — Encounter: Payer: Medicaid Other | Admitting: Physical Therapy

## 2013-01-10 DIAGNOSIS — H2510 Age-related nuclear cataract, unspecified eye: Secondary | ICD-10-CM | POA: Diagnosis not present

## 2013-01-10 DIAGNOSIS — E11311 Type 2 diabetes mellitus with unspecified diabetic retinopathy with macular edema: Secondary | ICD-10-CM | POA: Diagnosis not present

## 2013-01-10 DIAGNOSIS — E1139 Type 2 diabetes mellitus with other diabetic ophthalmic complication: Secondary | ICD-10-CM | POA: Diagnosis not present

## 2013-01-10 DIAGNOSIS — E11349 Type 2 diabetes mellitus with severe nonproliferative diabetic retinopathy without macular edema: Secondary | ICD-10-CM | POA: Diagnosis not present

## 2013-01-15 ENCOUNTER — Ambulatory Visit: Payer: Medicaid Other | Admitting: Physical Therapy

## 2013-01-17 ENCOUNTER — Ambulatory Visit: Payer: Medicaid Other | Attending: Surgery | Admitting: Physical Therapy

## 2013-01-17 DIAGNOSIS — R269 Unspecified abnormalities of gait and mobility: Secondary | ICD-10-CM | POA: Insufficient documentation

## 2013-01-17 DIAGNOSIS — M6281 Muscle weakness (generalized): Secondary | ICD-10-CM | POA: Insufficient documentation

## 2013-01-17 DIAGNOSIS — IMO0001 Reserved for inherently not codable concepts without codable children: Secondary | ICD-10-CM | POA: Insufficient documentation

## 2013-01-17 DIAGNOSIS — R5381 Other malaise: Secondary | ICD-10-CM | POA: Insufficient documentation

## 2013-01-17 DIAGNOSIS — S78119A Complete traumatic amputation at level between unspecified hip and knee, initial encounter: Secondary | ICD-10-CM | POA: Insufficient documentation

## 2013-01-21 ENCOUNTER — Ambulatory Visit: Payer: Medicaid Other | Admitting: Physical Therapy

## 2013-01-23 ENCOUNTER — Ambulatory Visit: Payer: Medicaid Other | Admitting: Physical Therapy

## 2013-01-25 ENCOUNTER — Ambulatory Visit: Payer: Medicaid Other | Admitting: Physical Therapy

## 2013-01-28 ENCOUNTER — Ambulatory Visit: Payer: Medicaid Other | Admitting: Physical Therapy

## 2013-01-30 ENCOUNTER — Ambulatory Visit: Payer: Medicaid Other | Admitting: Physical Therapy

## 2013-02-04 ENCOUNTER — Ambulatory Visit: Payer: Medicaid Other | Admitting: Physical Therapy

## 2013-02-06 ENCOUNTER — Ambulatory Visit: Payer: Medicaid Other | Admitting: Physical Therapy

## 2013-02-12 ENCOUNTER — Ambulatory Visit: Payer: Medicaid Other | Admitting: Physical Therapy

## 2013-02-14 ENCOUNTER — Ambulatory Visit: Payer: Medicaid Other | Admitting: Physical Therapy

## 2013-02-18 ENCOUNTER — Ambulatory Visit: Payer: Medicaid Other | Attending: Surgery | Admitting: Physical Therapy

## 2013-02-18 DIAGNOSIS — IMO0001 Reserved for inherently not codable concepts without codable children: Secondary | ICD-10-CM | POA: Insufficient documentation

## 2013-02-18 DIAGNOSIS — R269 Unspecified abnormalities of gait and mobility: Secondary | ICD-10-CM | POA: Insufficient documentation

## 2013-02-18 DIAGNOSIS — M6281 Muscle weakness (generalized): Secondary | ICD-10-CM | POA: Insufficient documentation

## 2013-02-18 DIAGNOSIS — R5381 Other malaise: Secondary | ICD-10-CM | POA: Insufficient documentation

## 2013-02-18 DIAGNOSIS — S78119A Complete traumatic amputation at level between unspecified hip and knee, initial encounter: Secondary | ICD-10-CM | POA: Insufficient documentation

## 2013-02-20 ENCOUNTER — Ambulatory Visit: Payer: Medicaid Other | Admitting: Physical Therapy

## 2013-02-25 ENCOUNTER — Encounter: Payer: Medicaid Other | Admitting: Physical Therapy

## 2013-02-27 ENCOUNTER — Encounter: Payer: Medicaid Other | Admitting: Physical Therapy

## 2013-03-04 ENCOUNTER — Encounter: Payer: Medicaid Other | Admitting: Physical Therapy

## 2013-03-06 ENCOUNTER — Encounter: Payer: Medicaid Other | Admitting: Physical Therapy

## 2013-03-11 ENCOUNTER — Encounter: Payer: Medicaid Other | Admitting: Physical Therapy

## 2013-03-13 ENCOUNTER — Encounter: Payer: Medicaid Other | Admitting: Physical Therapy

## 2013-05-16 DIAGNOSIS — E11349 Type 2 diabetes mellitus with severe nonproliferative diabetic retinopathy without macular edema: Secondary | ICD-10-CM | POA: Diagnosis not present

## 2013-05-16 DIAGNOSIS — E11311 Type 2 diabetes mellitus with unspecified diabetic retinopathy with macular edema: Secondary | ICD-10-CM | POA: Diagnosis not present

## 2013-05-16 DIAGNOSIS — E1139 Type 2 diabetes mellitus with other diabetic ophthalmic complication: Secondary | ICD-10-CM | POA: Diagnosis not present

## 2013-05-22 DIAGNOSIS — E1139 Type 2 diabetes mellitus with other diabetic ophthalmic complication: Secondary | ICD-10-CM | POA: Diagnosis not present

## 2013-05-22 DIAGNOSIS — E11311 Type 2 diabetes mellitus with unspecified diabetic retinopathy with macular edema: Secondary | ICD-10-CM | POA: Diagnosis not present

## 2013-05-22 DIAGNOSIS — E11349 Type 2 diabetes mellitus with severe nonproliferative diabetic retinopathy without macular edema: Secondary | ICD-10-CM | POA: Diagnosis not present

## 2013-08-19 DIAGNOSIS — I10 Essential (primary) hypertension: Secondary | ICD-10-CM | POA: Diagnosis not present

## 2013-08-26 DIAGNOSIS — F411 Generalized anxiety disorder: Secondary | ICD-10-CM | POA: Diagnosis not present

## 2013-08-26 DIAGNOSIS — E785 Hyperlipidemia, unspecified: Secondary | ICD-10-CM | POA: Diagnosis not present

## 2013-08-26 DIAGNOSIS — I10 Essential (primary) hypertension: Secondary | ICD-10-CM | POA: Diagnosis not present

## 2013-08-26 DIAGNOSIS — S88911A Complete traumatic amputation of right lower leg, level unspecified, initial encounter: Secondary | ICD-10-CM | POA: Diagnosis not present

## 2013-08-26 DIAGNOSIS — G894 Chronic pain syndrome: Secondary | ICD-10-CM | POA: Diagnosis not present

## 2013-08-26 DIAGNOSIS — E119 Type 2 diabetes mellitus without complications: Secondary | ICD-10-CM | POA: Diagnosis not present

## 2013-08-26 DIAGNOSIS — N19 Unspecified kidney failure: Secondary | ICD-10-CM | POA: Diagnosis not present

## 2013-08-26 DIAGNOSIS — I739 Peripheral vascular disease, unspecified: Secondary | ICD-10-CM | POA: Diagnosis not present

## 2013-09-26 DIAGNOSIS — E11311 Type 2 diabetes mellitus with unspecified diabetic retinopathy with macular edema: Secondary | ICD-10-CM | POA: Diagnosis not present

## 2013-09-26 DIAGNOSIS — H35049 Retinal micro-aneurysms, unspecified, unspecified eye: Secondary | ICD-10-CM | POA: Diagnosis not present

## 2013-09-26 DIAGNOSIS — E1139 Type 2 diabetes mellitus with other diabetic ophthalmic complication: Secondary | ICD-10-CM | POA: Diagnosis not present

## 2013-09-26 DIAGNOSIS — E11349 Type 2 diabetes mellitus with severe nonproliferative diabetic retinopathy without macular edema: Secondary | ICD-10-CM | POA: Diagnosis not present

## 2013-11-26 DIAGNOSIS — G894 Chronic pain syndrome: Secondary | ICD-10-CM | POA: Diagnosis not present

## 2013-11-26 DIAGNOSIS — I10 Essential (primary) hypertension: Secondary | ICD-10-CM | POA: Diagnosis not present

## 2013-11-26 DIAGNOSIS — E119 Type 2 diabetes mellitus without complications: Secondary | ICD-10-CM | POA: Diagnosis not present

## 2013-11-26 DIAGNOSIS — I739 Peripheral vascular disease, unspecified: Secondary | ICD-10-CM | POA: Diagnosis not present

## 2013-11-26 DIAGNOSIS — S88912A Complete traumatic amputation of left lower leg, level unspecified, initial encounter: Secondary | ICD-10-CM | POA: Diagnosis not present

## 2013-11-26 DIAGNOSIS — E785 Hyperlipidemia, unspecified: Secondary | ICD-10-CM | POA: Diagnosis not present

## 2013-11-26 DIAGNOSIS — S88911A Complete traumatic amputation of right lower leg, level unspecified, initial encounter: Secondary | ICD-10-CM | POA: Diagnosis not present

## 2013-11-26 DIAGNOSIS — F411 Generalized anxiety disorder: Secondary | ICD-10-CM | POA: Diagnosis not present

## 2013-11-26 DIAGNOSIS — N19 Unspecified kidney failure: Secondary | ICD-10-CM | POA: Diagnosis not present

## 2013-12-17 DIAGNOSIS — I739 Peripheral vascular disease, unspecified: Secondary | ICD-10-CM | POA: Diagnosis not present

## 2013-12-17 DIAGNOSIS — F411 Generalized anxiety disorder: Secondary | ICD-10-CM | POA: Diagnosis not present

## 2013-12-17 DIAGNOSIS — S88912A Complete traumatic amputation of left lower leg, level unspecified, initial encounter: Secondary | ICD-10-CM | POA: Diagnosis not present

## 2013-12-17 DIAGNOSIS — E785 Hyperlipidemia, unspecified: Secondary | ICD-10-CM | POA: Diagnosis not present

## 2013-12-17 DIAGNOSIS — E119 Type 2 diabetes mellitus without complications: Secondary | ICD-10-CM | POA: Diagnosis not present

## 2013-12-17 DIAGNOSIS — I10 Essential (primary) hypertension: Secondary | ICD-10-CM | POA: Diagnosis not present

## 2013-12-17 DIAGNOSIS — E559 Vitamin D deficiency, unspecified: Secondary | ICD-10-CM | POA: Diagnosis not present

## 2013-12-17 DIAGNOSIS — N19 Unspecified kidney failure: Secondary | ICD-10-CM | POA: Diagnosis not present

## 2013-12-17 DIAGNOSIS — G894 Chronic pain syndrome: Secondary | ICD-10-CM | POA: Diagnosis not present

## 2013-12-17 DIAGNOSIS — S88911A Complete traumatic amputation of right lower leg, level unspecified, initial encounter: Secondary | ICD-10-CM | POA: Diagnosis not present

## 2013-12-23 ENCOUNTER — Ambulatory Visit: Payer: Self-pay | Admitting: Surgery

## 2013-12-27 ENCOUNTER — Encounter: Payer: Self-pay | Admitting: Surgery

## 2013-12-30 ENCOUNTER — Ambulatory Visit (INDEPENDENT_AMBULATORY_CARE_PROVIDER_SITE_OTHER): Payer: Medicare Other | Admitting: Surgery

## 2013-12-30 ENCOUNTER — Encounter: Payer: Self-pay | Admitting: Surgery

## 2013-12-30 VITALS — BP 158/71 | HR 115 | Ht 64.0 in | Wt 167.2 lb

## 2013-12-30 DIAGNOSIS — Z48812 Encounter for surgical aftercare following surgery on the circulatory system: Secondary | ICD-10-CM

## 2013-12-30 DIAGNOSIS — I739 Peripheral vascular disease, unspecified: Secondary | ICD-10-CM

## 2013-12-30 NOTE — Progress Notes (Signed)
Patient name: Jenna Brown MRN: [id number] DOB: Aug 02, 1951 Sex: female     Chief Complaint  Patient presents with  . Re-evaluation    1 year f/u     HISTORY OF PRESENT ILLNESS: The patient is back today for followup. She underwent multiple attempts at revascularization of her right leg for nonhealing wound. On 05/10/2012, she ultimately required a right above-knee amputation. She has done very well since I had last seen her. She is now using a prosthetic leg.  She gets around with and without her walker.  She denies any symptoms on her left leg.   Past Medical History  Diagnosis Date  . Hyperlipidemia     takes Simvastatin daily  . Peripheral vascular disease   . Stroke     TIA history  . PONV (postoperative nausea and vomiting)   . Hypertension     takes Amlodipine/HCTZ/Losartan daily  . Vertigo     takes Ativert prn  . Hemorrhoids   . Anxiety     takes Xanax prn  . Myocardial infarction 1990's  . Type II diabetes mellitus     takes Glucovance and Januvia daily  . H/O hiatal hernia   . GERD (gastroesophageal reflux disease)   . Headache(784.0)     when b/p is elevated  . Migraines   . Arthritis     Past Surgical History  Procedure Laterality Date  . Cleft palate repair      several  . Pr vein bypass graft,aorto-fem-pop  05/27/11    Right SFA-Below knee Pop BP  . Tonsillectomy    . Dilation and curettage of uterus    . Aortogram  02/2012  . Colonoscopy    . Femoral-tibial bypass graft  03/29/2012    Procedure: BYPASS GRAFT FEMORAL-TIBIAL ARTERY;  Surgeon: [name], MD;  Location: [facility];  Service: Vascular;  Laterality: Right;  Right femoral to Posterior Tibialis with composite graft of 39mm x 80 cm ringed gortex graft and saphenous vein  ,intraoperative arteriogram  . Femoral-popliteal bypass graft  04/05/2012    Procedure: BYPASS GRAFT FEMORAL-POPLITEAL ARTERY;  Surgeon: [name], MD;  Location: [facility];  Service: Vascular;  Laterality: Right;   REVISION  . Myomectomy    . Tubal ligation  ~ 1990  . Femoropopliteal thrombectomy / embolectomy  05/09/2012  . Amputation  05/10/2012    Procedure: AMPUTATION ABOVE KNEE;  Surgeon: [name], MD;  Location: [facility] OR;  Service: Vascular;  Laterality: Right;   open right groin wound noted    History   Social History  . Marital Status: Single    Spouse Name: N/A    Number of Children: N/A  . Years of Education: N/A   Occupational History  . Not on file.   Social History Main Topics  . Smoking status: Former Smoker -- 0.25 packs/day for 40 years    Types: Cigarettes    Quit date: 02/27/2012  . Smokeless tobacco: Never Used  . Alcohol Use: No  . Drug Use: No  . Sexual Activity: No   Other Topics Concern  . Not on file   Social History Narrative  . No narrative on file    Family History  Problem Relation Age of Onset  . Cancer Mother     breast  . Diabetes Father     Allergies as of 12/30/2013 - Review Complete 12/30/2013  Allergen Reaction Noted  . Plavix [clopidogrel bisulfate] Itching 02/29/2012  . Codeine Other (  See Comments) 05/25/2011  . Tylenol with codeine #3 [acetaminophen-codeine] Other (See Comments) 02/28/2012  . Tylenol [acetaminophen] Other (See Comments) 05/25/2011    Current Outpatient Prescriptions on File Prior to Visit  Medication Sig Dispense Refill  . acetaminophen (TYLENOL) 325 MG tablet Take 325 mg by mouth every 6 (six) hours as needed.      . ALPRAZolam (XANAX) 0.25 MG tablet Take 0.25 mg by mouth daily as needed. For anxiety      . amLODipine (NORVASC) 10 MG tablet Take 10 mg by mouth every morning.       . fluconazole (DIFLUCAN) 200 MG tablet Take 1 tablet (200 mg total) by mouth daily.  30 tablet  0  . glyBURIDE-metformin (GLUCOVANCE) 5-500 MG per tablet Take 1 tablet by mouth 2 (two) times daily with a meal.       . hydrochlorothiazide (HYDRODIURIL) 25 MG tablet Take 25 mg by mouth every morning.       . Insulin Aspart (NOVOLOG [place])  Inject into the skin. As directed      . losartan (COZAAR) 100 MG tablet Take 100 mg by mouth every morning.       . meclizine (ANTIVERT) 25 MG tablet Take 25 mg by mouth daily as needed. For vertigo      . Multiple Vitamins-Minerals (ZINC PO) Take 220 mg by mouth daily.      . Rivaroxaban (XARELTO) 20 MG TABS Take 20 mg by mouth daily.      . sitaGLIPtin (JANUVIA) 100 MG tablet Take 100 mg by mouth every morning.      . vitamin C (ASCORBIC ACID) 500 MG tablet Take 500 mg by mouth daily.      Marland Kitchen atorvastatin (LIPITOR) 20 MG tablet Take 1 tablet (20 mg total) by mouth daily at 6 PM.  30 tablet  2   No current facility-administered medications on file prior to visit.     REVIEW OF SYSTEMS: No changes from prior visit  PHYSICAL EXAMINATION:   Vital signs are BP 158/71  Pulse 115  Ht 5\' 4"  (1.626 m)  Wt 167 lb 3.2 oz (75.841 kg)  BMI 28.69 kg/m2  SpO2 99% General: The patient appears their stated age. HEENT:  No gross abnormalities Pulmonary:  Non labored breathing Musculoskeletal: Status post right above-knee amputation Neurologic: No focal weakness or paresthesias are detected, Skin: There are no ulcer or rashes noted. Psychiatric: The patient has normal affect. Cardiovascular: There is a regular rate and rhythm without significant murmur appreciated.  No carotid bruits   Diagnostic Studies None  Assessment: Status post right above-knee amputation Plan: The patient continues to do well.  She is wearing her prosthesis today.  She ambulates with a walker.  She has no left leg symptoms.  I have scheduled her to follow up in one year with ankle-brachial indices of the left leg  V. [name], M.D. [facility] [place] Office: [phone] Pager:  [phone]  VASCULAR QUALITY INITIATIVE FOLLOW UP DATA:  Current smoker: [  ] yes  [ x ] no  Living status: [ x ]  Home  [  ] Nursing home  [  ] Homeless    MEDS:  ASA [ x ] yes  [  ] no- [  ]  medical reason  [  ] non compliant  STATIN  [ x ] yes  [  ] no- [  ] medical reason  [  ] non compliant  Beta blocker [  ]  yes  [x  ] no- [x  ] medical reason  [  ] non compliant  ACE inhibitor [ x ] yes  [  ] no- [  ] medical reason  [  ] non compliant  P2Y12 Antagonist [  x] none  [  ] clopidogrel-Plavix  [  ] ticlopidine-Ticlid   [  ] prasugrel-Effient  [  ] ticagrelor- Brilinta    Anticoagulant [x  ] None  [  ] warfarin  [  ] rivaroxaban-Xarelto [  ] dabigatran- Pradaxa  Ambulation: [  ] Amb  [ x ] Amb with assistance  [  ] wheelchair  [  ] bedridden  Ipsilateral Sx: [ x ] none   [  ] claudication  [  ] rest pain  [  ] tissue loss  Current Patency: [  ] primary  [  ] primary-assisted  [  ] secondary  [x  ] occluded   Infection: [x  ] none  [  ] cellulitis  [  ] deep abscess  [  ] infection of artery or graft    Major amputation: [  ] no  [  ] minor amp  [  ] BKA  [ x ] AKA   Date:

## 2013-12-31 NOTE — Addendum Note (Signed)
Addended by: Dorthula Rue L on: 12/31/2013 01:55 PM   Modules accepted: Orders

## 2014-01-07 DIAGNOSIS — F411 Generalized anxiety disorder: Secondary | ICD-10-CM | POA: Diagnosis not present

## 2014-01-07 DIAGNOSIS — S88912A Complete traumatic amputation of left lower leg, level unspecified, initial encounter: Secondary | ICD-10-CM | POA: Diagnosis not present

## 2014-01-07 DIAGNOSIS — N19 Unspecified kidney failure: Secondary | ICD-10-CM | POA: Diagnosis not present

## 2014-01-07 DIAGNOSIS — I739 Peripheral vascular disease, unspecified: Secondary | ICD-10-CM | POA: Diagnosis not present

## 2014-01-07 DIAGNOSIS — E119 Type 2 diabetes mellitus without complications: Secondary | ICD-10-CM | POA: Diagnosis not present

## 2014-01-07 DIAGNOSIS — E785 Hyperlipidemia, unspecified: Secondary | ICD-10-CM | POA: Diagnosis not present

## 2014-01-07 DIAGNOSIS — S88911A Complete traumatic amputation of right lower leg, level unspecified, initial encounter: Secondary | ICD-10-CM | POA: Diagnosis not present

## 2014-01-07 DIAGNOSIS — I10 Essential (primary) hypertension: Secondary | ICD-10-CM | POA: Diagnosis not present

## 2014-01-07 DIAGNOSIS — K3189 Other diseases of stomach and duodenum: Secondary | ICD-10-CM | POA: Diagnosis not present

## 2014-02-06 DIAGNOSIS — E11349 Type 2 diabetes mellitus with severe nonproliferative diabetic retinopathy without macular edema: Secondary | ICD-10-CM | POA: Diagnosis not present

## 2014-02-06 DIAGNOSIS — E11311 Type 2 diabetes mellitus with unspecified diabetic retinopathy with macular edema: Secondary | ICD-10-CM | POA: Diagnosis not present

## 2014-02-06 DIAGNOSIS — H359 Unspecified retinal disorder: Secondary | ICD-10-CM | POA: Diagnosis not present

## 2014-02-06 DIAGNOSIS — E1139 Type 2 diabetes mellitus with other diabetic ophthalmic complication: Secondary | ICD-10-CM | POA: Diagnosis not present

## 2014-03-06 DIAGNOSIS — I798 Other disorders of arteries, arterioles and capillaries in diseases classified elsewhere: Secondary | ICD-10-CM | POA: Diagnosis not present

## 2014-03-06 DIAGNOSIS — I129 Hypertensive chronic kidney disease with stage 1 through stage 4 chronic kidney disease, or unspecified chronic kidney disease: Secondary | ICD-10-CM | POA: Diagnosis not present

## 2014-03-06 DIAGNOSIS — E1129 Type 2 diabetes mellitus with other diabetic kidney complication: Secondary | ICD-10-CM | POA: Diagnosis not present

## 2014-03-06 DIAGNOSIS — N189 Chronic kidney disease, unspecified: Secondary | ICD-10-CM | POA: Diagnosis not present

## 2014-03-06 DIAGNOSIS — Z602 Problems related to living alone: Secondary | ICD-10-CM | POA: Diagnosis not present

## 2014-03-06 DIAGNOSIS — E1159 Type 2 diabetes mellitus with other circulatory complications: Secondary | ICD-10-CM | POA: Diagnosis not present

## 2014-03-06 DIAGNOSIS — N183 Chronic kidney disease, stage 3 unspecified: Secondary | ICD-10-CM | POA: Diagnosis not present

## 2014-03-06 DIAGNOSIS — S78119A Complete traumatic amputation at level between unspecified hip and knee, initial encounter: Secondary | ICD-10-CM | POA: Diagnosis not present

## 2014-03-06 DIAGNOSIS — I10 Essential (primary) hypertension: Secondary | ICD-10-CM | POA: Diagnosis not present

## 2014-03-10 DIAGNOSIS — S78119A Complete traumatic amputation at level between unspecified hip and knee, initial encounter: Secondary | ICD-10-CM | POA: Diagnosis not present

## 2014-03-10 DIAGNOSIS — N189 Chronic kidney disease, unspecified: Secondary | ICD-10-CM | POA: Diagnosis not present

## 2014-03-10 DIAGNOSIS — E1159 Type 2 diabetes mellitus with other circulatory complications: Secondary | ICD-10-CM | POA: Diagnosis not present

## 2014-03-10 DIAGNOSIS — Z602 Problems related to living alone: Secondary | ICD-10-CM | POA: Diagnosis not present

## 2014-03-10 DIAGNOSIS — I798 Other disorders of arteries, arterioles and capillaries in diseases classified elsewhere: Secondary | ICD-10-CM | POA: Diagnosis not present

## 2014-03-10 DIAGNOSIS — I129 Hypertensive chronic kidney disease with stage 1 through stage 4 chronic kidney disease, or unspecified chronic kidney disease: Secondary | ICD-10-CM | POA: Diagnosis not present

## 2014-03-13 DIAGNOSIS — Z602 Problems related to living alone: Secondary | ICD-10-CM | POA: Diagnosis not present

## 2014-03-13 DIAGNOSIS — N189 Chronic kidney disease, unspecified: Secondary | ICD-10-CM | POA: Diagnosis not present

## 2014-03-13 DIAGNOSIS — I129 Hypertensive chronic kidney disease with stage 1 through stage 4 chronic kidney disease, or unspecified chronic kidney disease: Secondary | ICD-10-CM | POA: Diagnosis not present

## 2014-03-13 DIAGNOSIS — I798 Other disorders of arteries, arterioles and capillaries in diseases classified elsewhere: Secondary | ICD-10-CM | POA: Diagnosis not present

## 2014-03-13 DIAGNOSIS — S78119A Complete traumatic amputation at level between unspecified hip and knee, initial encounter: Secondary | ICD-10-CM | POA: Diagnosis not present

## 2014-03-13 DIAGNOSIS — E1159 Type 2 diabetes mellitus with other circulatory complications: Secondary | ICD-10-CM | POA: Diagnosis not present

## 2014-03-20 DIAGNOSIS — N189 Chronic kidney disease, unspecified: Secondary | ICD-10-CM | POA: Diagnosis not present

## 2014-03-20 DIAGNOSIS — S78119A Complete traumatic amputation at level between unspecified hip and knee, initial encounter: Secondary | ICD-10-CM | POA: Diagnosis not present

## 2014-03-20 DIAGNOSIS — E1159 Type 2 diabetes mellitus with other circulatory complications: Secondary | ICD-10-CM | POA: Diagnosis not present

## 2014-03-20 DIAGNOSIS — I798 Other disorders of arteries, arterioles and capillaries in diseases classified elsewhere: Secondary | ICD-10-CM | POA: Diagnosis not present

## 2014-03-20 DIAGNOSIS — I129 Hypertensive chronic kidney disease with stage 1 through stage 4 chronic kidney disease, or unspecified chronic kidney disease: Secondary | ICD-10-CM | POA: Diagnosis not present

## 2014-03-20 DIAGNOSIS — Z602 Problems related to living alone: Secondary | ICD-10-CM | POA: Diagnosis not present

## 2014-03-24 DIAGNOSIS — R5383 Other fatigue: Secondary | ICD-10-CM | POA: Diagnosis not present

## 2014-03-24 DIAGNOSIS — I10 Essential (primary) hypertension: Secondary | ICD-10-CM | POA: Diagnosis not present

## 2014-03-24 DIAGNOSIS — E291 Testicular hypofunction: Secondary | ICD-10-CM | POA: Diagnosis not present

## 2014-03-24 DIAGNOSIS — Z1289 Encounter for screening for malignant neoplasm of other sites: Secondary | ICD-10-CM | POA: Diagnosis not present

## 2014-03-24 DIAGNOSIS — R945 Abnormal results of liver function studies: Secondary | ICD-10-CM | POA: Diagnosis not present

## 2014-03-24 DIAGNOSIS — R946 Abnormal results of thyroid function studies: Secondary | ICD-10-CM | POA: Diagnosis not present

## 2014-03-24 DIAGNOSIS — E119 Type 2 diabetes mellitus without complications: Secondary | ICD-10-CM | POA: Diagnosis not present

## 2014-03-24 DIAGNOSIS — I119 Hypertensive heart disease without heart failure: Secondary | ICD-10-CM | POA: Diagnosis not present

## 2014-03-24 DIAGNOSIS — R972 Elevated prostate specific antigen [PSA]: Secondary | ICD-10-CM | POA: Diagnosis not present

## 2014-03-24 DIAGNOSIS — N390 Urinary tract infection, site not specified: Secondary | ICD-10-CM | POA: Diagnosis not present

## 2014-03-24 DIAGNOSIS — R5381 Other malaise: Secondary | ICD-10-CM | POA: Diagnosis not present

## 2014-03-25 DIAGNOSIS — I129 Hypertensive chronic kidney disease with stage 1 through stage 4 chronic kidney disease, or unspecified chronic kidney disease: Secondary | ICD-10-CM | POA: Diagnosis not present

## 2014-03-25 DIAGNOSIS — N189 Chronic kidney disease, unspecified: Secondary | ICD-10-CM | POA: Diagnosis not present

## 2014-03-25 DIAGNOSIS — I798 Other disorders of arteries, arterioles and capillaries in diseases classified elsewhere: Secondary | ICD-10-CM | POA: Diagnosis not present

## 2014-03-25 DIAGNOSIS — E559 Vitamin D deficiency, unspecified: Secondary | ICD-10-CM | POA: Diagnosis not present

## 2014-03-25 DIAGNOSIS — S78119A Complete traumatic amputation at level between unspecified hip and knee, initial encounter: Secondary | ICD-10-CM | POA: Diagnosis not present

## 2014-03-25 DIAGNOSIS — Z602 Problems related to living alone: Secondary | ICD-10-CM | POA: Diagnosis not present

## 2014-03-25 DIAGNOSIS — E1159 Type 2 diabetes mellitus with other circulatory complications: Secondary | ICD-10-CM | POA: Diagnosis not present

## 2014-04-07 DIAGNOSIS — I739 Peripheral vascular disease, unspecified: Secondary | ICD-10-CM | POA: Diagnosis not present

## 2014-04-07 DIAGNOSIS — R1013 Epigastric pain: Secondary | ICD-10-CM | POA: Diagnosis not present

## 2014-04-07 DIAGNOSIS — E119 Type 2 diabetes mellitus without complications: Secondary | ICD-10-CM | POA: Diagnosis not present

## 2014-04-07 DIAGNOSIS — E785 Hyperlipidemia, unspecified: Secondary | ICD-10-CM | POA: Diagnosis not present

## 2014-04-07 DIAGNOSIS — K3189 Other diseases of stomach and duodenum: Secondary | ICD-10-CM | POA: Diagnosis not present

## 2014-04-07 DIAGNOSIS — S88911A Complete traumatic amputation of right lower leg, level unspecified, initial encounter: Secondary | ICD-10-CM | POA: Diagnosis not present

## 2014-04-07 DIAGNOSIS — Z7901 Long term (current) use of anticoagulants: Secondary | ICD-10-CM | POA: Diagnosis not present

## 2014-04-07 DIAGNOSIS — I10 Essential (primary) hypertension: Secondary | ICD-10-CM | POA: Diagnosis not present

## 2014-04-07 DIAGNOSIS — N19 Unspecified kidney failure: Secondary | ICD-10-CM | POA: Diagnosis not present

## 2014-04-07 DIAGNOSIS — S88912A Complete traumatic amputation of left lower leg, level unspecified, initial encounter: Secondary | ICD-10-CM | POA: Diagnosis not present

## 2014-04-07 DIAGNOSIS — Z79899 Other long term (current) drug therapy: Secondary | ICD-10-CM | POA: Diagnosis not present

## 2014-04-07 DIAGNOSIS — F411 Generalized anxiety disorder: Secondary | ICD-10-CM | POA: Diagnosis not present

## 2014-04-09 DIAGNOSIS — E1159 Type 2 diabetes mellitus with other circulatory complications: Secondary | ICD-10-CM | POA: Diagnosis not present

## 2014-04-09 DIAGNOSIS — Z602 Problems related to living alone: Secondary | ICD-10-CM | POA: Diagnosis not present

## 2014-04-09 DIAGNOSIS — S78119A Complete traumatic amputation at level between unspecified hip and knee, initial encounter: Secondary | ICD-10-CM | POA: Diagnosis not present

## 2014-04-09 DIAGNOSIS — I798 Other disorders of arteries, arterioles and capillaries in diseases classified elsewhere: Secondary | ICD-10-CM | POA: Diagnosis not present

## 2014-04-09 DIAGNOSIS — I129 Hypertensive chronic kidney disease with stage 1 through stage 4 chronic kidney disease, or unspecified chronic kidney disease: Secondary | ICD-10-CM | POA: Diagnosis not present

## 2014-04-09 DIAGNOSIS — N189 Chronic kidney disease, unspecified: Secondary | ICD-10-CM | POA: Diagnosis not present

## 2014-04-29 DIAGNOSIS — I798 Other disorders of arteries, arterioles and capillaries in diseases classified elsewhere: Secondary | ICD-10-CM | POA: Diagnosis not present

## 2014-04-29 DIAGNOSIS — S78119A Complete traumatic amputation at level between unspecified hip and knee, initial encounter: Secondary | ICD-10-CM | POA: Diagnosis not present

## 2014-04-29 DIAGNOSIS — I129 Hypertensive chronic kidney disease with stage 1 through stage 4 chronic kidney disease, or unspecified chronic kidney disease: Secondary | ICD-10-CM | POA: Diagnosis not present

## 2014-04-29 DIAGNOSIS — Z602 Problems related to living alone: Secondary | ICD-10-CM | POA: Diagnosis not present

## 2014-04-29 DIAGNOSIS — N189 Chronic kidney disease, unspecified: Secondary | ICD-10-CM | POA: Diagnosis not present

## 2014-04-29 DIAGNOSIS — E1159 Type 2 diabetes mellitus with other circulatory complications: Secondary | ICD-10-CM | POA: Diagnosis not present

## 2014-05-21 DIAGNOSIS — E785 Hyperlipidemia, unspecified: Secondary | ICD-10-CM | POA: Diagnosis not present

## 2014-05-21 DIAGNOSIS — I10 Essential (primary) hypertension: Secondary | ICD-10-CM | POA: Diagnosis not present

## 2014-05-21 DIAGNOSIS — E119 Type 2 diabetes mellitus without complications: Secondary | ICD-10-CM | POA: Diagnosis not present

## 2014-05-21 DIAGNOSIS — I739 Peripheral vascular disease, unspecified: Secondary | ICD-10-CM | POA: Diagnosis not present

## 2014-07-03 DIAGNOSIS — H3561 Retinal hemorrhage, right eye: Secondary | ICD-10-CM | POA: Diagnosis not present

## 2014-07-03 DIAGNOSIS — E11311 Type 2 diabetes mellitus with unspecified diabetic retinopathy with macular edema: Secondary | ICD-10-CM | POA: Diagnosis not present

## 2014-07-08 DIAGNOSIS — E559 Vitamin D deficiency, unspecified: Secondary | ICD-10-CM | POA: Diagnosis not present

## 2014-07-08 DIAGNOSIS — I10 Essential (primary) hypertension: Secondary | ICD-10-CM | POA: Diagnosis not present

## 2014-07-08 DIAGNOSIS — N19 Unspecified kidney failure: Secondary | ICD-10-CM | POA: Diagnosis not present

## 2014-07-08 DIAGNOSIS — E119 Type 2 diabetes mellitus without complications: Secondary | ICD-10-CM | POA: Diagnosis not present

## 2014-07-08 DIAGNOSIS — F419 Anxiety disorder, unspecified: Secondary | ICD-10-CM | POA: Diagnosis not present

## 2014-07-08 DIAGNOSIS — Z89611 Acquired absence of right leg above knee: Secondary | ICD-10-CM | POA: Diagnosis not present

## 2014-07-08 DIAGNOSIS — E784 Other hyperlipidemia: Secondary | ICD-10-CM | POA: Diagnosis not present

## 2014-07-08 DIAGNOSIS — I119 Hypertensive heart disease without heart failure: Secondary | ICD-10-CM | POA: Diagnosis not present

## 2014-08-28 ENCOUNTER — Encounter (HOSPITAL_COMMUNITY): Payer: Self-pay | Admitting: Surgery

## 2014-09-19 DIAGNOSIS — I2510 Atherosclerotic heart disease of native coronary artery without angina pectoris: Secondary | ICD-10-CM

## 2014-09-19 HISTORY — DX: Atherosclerotic heart disease of native coronary artery without angina pectoris: I25.10

## 2014-10-17 DIAGNOSIS — Z803 Family history of malignant neoplasm of breast: Secondary | ICD-10-CM | POA: Diagnosis not present

## 2014-10-17 DIAGNOSIS — Z1231 Encounter for screening mammogram for malignant neoplasm of breast: Secondary | ICD-10-CM | POA: Diagnosis not present

## 2014-11-19 DIAGNOSIS — L0292 Furuncle, unspecified: Secondary | ICD-10-CM | POA: Diagnosis not present

## 2014-11-19 DIAGNOSIS — E119 Type 2 diabetes mellitus without complications: Secondary | ICD-10-CM | POA: Diagnosis not present

## 2014-11-19 DIAGNOSIS — L0390 Cellulitis, unspecified: Secondary | ICD-10-CM | POA: Diagnosis not present

## 2014-11-26 DIAGNOSIS — L0292 Furuncle, unspecified: Secondary | ICD-10-CM | POA: Diagnosis not present

## 2014-11-26 DIAGNOSIS — E119 Type 2 diabetes mellitus without complications: Secondary | ICD-10-CM | POA: Diagnosis not present

## 2014-11-26 DIAGNOSIS — E559 Vitamin D deficiency, unspecified: Secondary | ICD-10-CM | POA: Diagnosis not present

## 2014-11-26 DIAGNOSIS — N19 Unspecified kidney failure: Secondary | ICD-10-CM | POA: Diagnosis not present

## 2014-11-26 DIAGNOSIS — F419 Anxiety disorder, unspecified: Secondary | ICD-10-CM | POA: Diagnosis not present

## 2014-11-26 DIAGNOSIS — I10 Essential (primary) hypertension: Secondary | ICD-10-CM | POA: Diagnosis not present

## 2014-11-26 DIAGNOSIS — L0390 Cellulitis, unspecified: Secondary | ICD-10-CM | POA: Diagnosis not present

## 2014-11-26 DIAGNOSIS — Z89611 Acquired absence of right leg above knee: Secondary | ICD-10-CM | POA: Diagnosis not present

## 2015-01-01 ENCOUNTER — Encounter: Payer: Self-pay | Admitting: Family

## 2015-01-01 DIAGNOSIS — E11311 Type 2 diabetes mellitus with unspecified diabetic retinopathy with macular edema: Secondary | ICD-10-CM | POA: Diagnosis not present

## 2015-01-01 DIAGNOSIS — E11359 Type 2 diabetes mellitus with proliferative diabetic retinopathy without macular edema: Secondary | ICD-10-CM | POA: Diagnosis not present

## 2015-01-01 DIAGNOSIS — H3561 Retinal hemorrhage, right eye: Secondary | ICD-10-CM | POA: Diagnosis not present

## 2015-01-02 ENCOUNTER — Encounter: Payer: Self-pay | Admitting: Family

## 2015-01-02 ENCOUNTER — Ambulatory Visit (INDEPENDENT_AMBULATORY_CARE_PROVIDER_SITE_OTHER): Payer: Medicare Other | Admitting: Family

## 2015-01-02 ENCOUNTER — Ambulatory Visit (HOSPITAL_COMMUNITY)
Admission: RE | Admit: 2015-01-02 | Discharge: 2015-01-02 | Disposition: A | Discharge status: Home / Self Care | Payer: Medicare Other | Source: Ambulatory Visit | Attending: Surgery | Admitting: Surgery

## 2015-01-02 VITALS — BP 146/88 | HR 111 | Resp 16 | Ht 64.0 in | Wt 150.0 lb

## 2015-01-02 DIAGNOSIS — E1159 Type 2 diabetes mellitus with other circulatory complications: Secondary | ICD-10-CM | POA: Insufficient documentation

## 2015-01-02 DIAGNOSIS — I739 Peripheral vascular disease, unspecified: Secondary | ICD-10-CM | POA: Insufficient documentation

## 2015-01-02 DIAGNOSIS — Z89611 Acquired absence of right leg above knee: Secondary | ICD-10-CM | POA: Diagnosis not present

## 2015-01-02 DIAGNOSIS — S78111A Complete traumatic amputation at level between right hip and knee, initial encounter: Secondary | ICD-10-CM

## 2015-01-02 DIAGNOSIS — Z87891 Personal history of nicotine dependence: Secondary | ICD-10-CM | POA: Diagnosis not present

## 2015-01-02 DIAGNOSIS — Z48812 Encounter for surgical aftercare following surgery on the circulatory system: Secondary | ICD-10-CM

## 2015-01-02 DIAGNOSIS — E1151 Type 2 diabetes mellitus with diabetic peripheral angiopathy without gangrene: Secondary | ICD-10-CM

## 2015-01-02 NOTE — Progress Notes (Signed)
[facility] [place] HISTORY AND PHYSICAL -PAD  History of Present Illness Jenna Brown is a 64 y.o. female patient of Dr. [name] who is back today for followup. She underwent multiple attempts at revascularization of her right leg for nonhealing wound. On 05/10/2012, she ultimately required a right above-knee amputation.  She is now using a prosthetic leg. She gets around with and without her walker. She denies any symptoms on her left leg, denies non healing wounds.  She had a stroke in the in the 1990's as manifested by left facial droop, difficulty speaking, no hemiparesis, no monocular loss of vision.   The patient reports New Medical or Surgical History: boil lanced on her back.  Pt Diabetic: Yes, states in good control Pt smoker: former smoker, quit in 2013  Pt meds include: Statin :no, "that medicine shut my kidneys down" ASA: Yes, 81 mg daily Other anticoagulants/antiplatelets: no, states Xaralto caused her to feel like she was dying, did not eat when she was on [place]  Past Medical History  Diagnosis Date  . Hyperlipidemia     takes Simvastatin daily  . Peripheral vascular disease   . Stroke     TIA history  . PONV (postoperative nausea and vomiting)   . Hypertension     takes Amlodipine/HCTZ/Losartan daily  . Vertigo     takes Ativert prn  . Hemorrhoids   . Anxiety     takes Xanax prn  . Myocardial infarction 1990's  . Type II diabetes mellitus     takes Glucovance and Januvia daily  . H/O hiatal hernia   . GERD (gastroesophageal reflux disease)   . Headache(784.0)     when b/p is elevated  . Migraines   . Arthritis     Social History History  Substance Use Topics  . Smoking status: Former Smoker -- 0.25 packs/day for 40 years    Types: Cigarettes    Quit date: 02/27/2012  . Smokeless tobacco: Never Used  . Alcohol Use: No    Family History Family History  Problem Relation Age of Onset  . Cancer Mother     breast  .  Diabetes Father     Past Surgical History  Procedure Laterality Date  . Cleft palate repair      several  . Pr vein bypass graft,aorto-fem-pop  05/27/11    Right SFA-Below knee Pop BP  . Tonsillectomy    . Dilation and curettage of uterus    . Aortogram  02/2012  . Colonoscopy    . Femoral-tibial bypass graft  03/29/2012    Procedure: BYPASS GRAFT FEMORAL-TIBIAL ARTERY;  Surgeon: [name], MD;  Location: [facility] OR;  Service: Vascular;  Laterality: Right;  Right femoral to Posterior Tibialis with composite graft of 59mm x 80 cm ringed gortex graft and saphenous vein  ,intraoperative arteriogram  . Femoral-popliteal bypass graft  04/05/2012    Procedure: BYPASS GRAFT FEMORAL-POPLITEAL ARTERY;  Surgeon: [name], MD;  Location: [facility];  Service: Vascular;  Laterality: Right;  REVISION  . Myomectomy    . Tubal ligation  ~ 1990  . Femoropopliteal thrombectomy / embolectomy  05/09/2012  . Amputation  05/10/2012    Procedure: AMPUTATION ABOVE KNEE;  Surgeon: [name], MD;  Location: [facility] OR;  Service: Vascular;  Laterality: Right;   open right groin wound noted  . Abdominal aortagram N/A 02/29/2012    Procedure: ABDOMINAL Maxcine Ham;  Surgeon: [name], MD;  Location: [facility] CATH LAB;  Service: Cardiovascular;  Laterality: N/A;    Allergies  Allergen Reactions  . Plavix [Clopidogrel Bisulfate] Itching  . Codeine Other (See Comments)    "makes me feel strange"  . Tylenol With Codeine #3 [Acetaminophen-Codeine] Other (See Comments)    "doesn't make me feel right"  . Tylenol [Acetaminophen] Other (See Comments)    "Doesn't feel right"     Current Outpatient Prescriptions  Medication Sig Dispense Refill  . ALPRAZolam (XANAX) 0.25 MG tablet Take 0.25 mg by mouth daily as needed. For anxiety    . amLODipine (NORVASC) 10 MG tablet Take 5 mg by mouth every morning.     . glyBURIDE-metformin (GLUCOVANCE) 5-500 MG per tablet Take 1 tablet by mouth 2 (two) times daily with a meal.      . hydrochlorothiazide (HYDRODIURIL) 25 MG tablet Take 25 mg by mouth every morning.     Marland Kitchen losartan (COZAAR) 100 MG tablet Take 100 mg by mouth every morning.     . meclizine (ANTIVERT) 25 MG tablet Take 25 mg by mouth daily as needed. For vertigo    . Multiple Vitamins-Minerals (ZINC PO) Take 220 mg by mouth daily.    . Rivaroxaban (XARELTO) 20 MG TABS Take 20 mg by mouth daily.    . sitaGLIPtin (JANUVIA) 100 MG tablet Take 100 mg by mouth every morning.    . vitamin C (ASCORBIC ACID) 500 MG tablet Take 500 mg by mouth daily.    Marland Kitchen acetaminophen (TYLENOL) 325 MG tablet Take 325 mg by mouth every 6 (six) hours as needed.    Marland Kitchen atorvastatin (LIPITOR) 20 MG tablet Take 1 tablet (20 mg total) by mouth daily at 6 PM. 30 tablet 2  . fluconazole (DIFLUCAN) 200 MG tablet Take 1 tablet (200 mg total) by mouth daily. (Patient not taking: Reported on 01/02/2015) 30 tablet 0  . Insulin Aspart (NOVOLOG [place]) Inject into the skin. As directed     No current facility-administered medications for this visit.    ROS: See HPI for pertinent positives and negatives.   Physical Examination  Filed Vitals:   01/02/15 1557 01/02/15 1606  BP: 145/85 146/88  Pulse: 112 111  Resp: 16   Height: 5\' 4"  (1.626 m)   Weight: 150 lb (68.04 kg)   SpO2: 95%    Body mass index is 25.73 kg/(m^2).  General: A&O x 3, WDWN Gait: , seated in her wheelchair. Eyes: PERRLA. Pulmonary: CTAB, without wheezes , rales or rhonchi. Cardiac: regular Rythm , without detected murmur.         Carotid Bruits Right Left   Negative Negative  Aorta is not palpable. Radial pulses: 2+ palpable and =                           VASCULAR EXAM: Extremities without ischemic changes, without Gangrene; without open wounds. Right AKA.  LE Pulses Right Left       FEMORAL  not palpable  not palpable        POPLITEAL  AKA   not palpable        POSTERIOR TIBIAL  AKA   not palpable        DORSALIS PEDIS      ANTERIOR TIBIAL AKA  not palpable    Abdomen: soft, NT, no palpable masses. Skin: no rashes, no ulcers. Musculoskeletal: no muscle wasting or atrophy. See extremities.  Neurologic: A&O X 3; Appropriate Affect ; SENSATION: normal; MOTOR FUNCTION:  moving all extremities equally, motor strength 5/5 throughout. Speech is fluent/normal. CN 2-12 intact.    Non-Invasive Vascular Imaging: DATE: 01/02/2015 ABI: RIGHT (AKA);  LEFT 0.96 (03/20/12, 0.97), Waveforms: Biphasic; TBI: 0.72   ASSESSMENT: Jenna Brown is a 64 y.o. female who is s/p multiple attempts at revascularization of her right leg for nonhealing wound. On 05/10/2012, she ultimately required a right above-knee amputation.   She walks with her walker at times, rarely uses her right AKA prosthesis as she states it is too heavy. She has no claudication in her left leg, no tissue loss. Her atherosclerotic risk factors include Type 2 DM, and former smoker. Left ABI remains in the normal range at 0.96, biphasic waveforms, TBI is also normal at 0.73.  Face to face time with patient was 25 minutes. Over 50% of this time was spent on counseling and coordination of care.    PLAN:  I discussed in depth with the patient the nature of atherosclerosis, and emphasized the importance of maximal medical management including strict control of blood pressure, blood glucose, and lipid levels, obtaining regular exercise, and continued cessation of smoking.  The patient is aware that without maximal medical management the underlying atherosclerotic disease process will progress, limiting the benefit of any interventions.  Based on the patient's vascular studies and examination, pt will return to clinic in 1 year with ABI's and follow up with me.  The patient was given information about PAD including signs, symptoms, treatment, what symptoms should prompt the patient to seek  immediate medical care, and risk reduction measures to take.  Clemon Chambers, RN, MSN, FNP-C [facility] Phone: [phone]  Clinic MD: Bridgett Larsson  01/02/2015 4:19 PM

## 2015-01-02 NOTE — Progress Notes (Signed)
Filed Vitals:   01/02/15 1557 01/02/15 1606  BP: 145/85 146/88  Pulse: 112 111  Resp: 16   Height: 5\' 4"  (1.626 m)   Weight: 150 lb (68.04 kg)   SpO2: 95%

## 2015-01-02 NOTE — Patient Instructions (Signed)

## 2015-01-05 ENCOUNTER — Encounter (HOSPITAL_COMMUNITY): Payer: Medicaid Other

## 2015-01-05 ENCOUNTER — Ambulatory Visit: Payer: Medicaid Other | Admitting: Family

## 2015-03-10 DIAGNOSIS — E784 Other hyperlipidemia: Secondary | ICD-10-CM | POA: Diagnosis not present

## 2015-03-10 DIAGNOSIS — F419 Anxiety disorder, unspecified: Secondary | ICD-10-CM | POA: Diagnosis not present

## 2015-03-10 DIAGNOSIS — Z01118 Encounter for examination of ears and hearing with other abnormal findings: Secondary | ICD-10-CM | POA: Diagnosis not present

## 2015-03-10 DIAGNOSIS — E559 Vitamin D deficiency, unspecified: Secondary | ICD-10-CM | POA: Diagnosis not present

## 2015-03-10 DIAGNOSIS — N19 Unspecified kidney failure: Secondary | ICD-10-CM | POA: Diagnosis not present

## 2015-03-10 DIAGNOSIS — Z1389 Encounter for screening for other disorder: Secondary | ICD-10-CM | POA: Diagnosis not present

## 2015-03-10 DIAGNOSIS — I119 Hypertensive heart disease without heart failure: Secondary | ICD-10-CM | POA: Diagnosis not present

## 2015-03-10 DIAGNOSIS — Z0100 Encounter for examination of eyes and vision without abnormal findings: Secondary | ICD-10-CM | POA: Diagnosis not present

## 2015-03-10 DIAGNOSIS — E119 Type 2 diabetes mellitus without complications: Secondary | ICD-10-CM | POA: Diagnosis not present

## 2015-03-10 DIAGNOSIS — Z136 Encounter for screening for cardiovascular disorders: Secondary | ICD-10-CM | POA: Diagnosis not present

## 2015-03-10 DIAGNOSIS — Z113 Encounter for screening for infections with a predominantly sexual mode of transmission: Secondary | ICD-10-CM | POA: Diagnosis not present

## 2015-03-10 DIAGNOSIS — Z0000 Encounter for general adult medical examination without abnormal findings: Secondary | ICD-10-CM | POA: Diagnosis not present

## 2015-03-10 DIAGNOSIS — Z131 Encounter for screening for diabetes mellitus: Secondary | ICD-10-CM | POA: Diagnosis not present

## 2015-03-10 DIAGNOSIS — I10 Essential (primary) hypertension: Secondary | ICD-10-CM | POA: Diagnosis not present

## 2015-03-16 ENCOUNTER — Other Ambulatory Visit: Payer: Self-pay | Admitting: Physician Assistant

## 2015-03-16 DIAGNOSIS — R5381 Other malaise: Secondary | ICD-10-CM

## 2015-03-16 DIAGNOSIS — Z1231 Encounter for screening mammogram for malignant neoplasm of breast: Secondary | ICD-10-CM

## 2015-04-10 ENCOUNTER — Emergency Department (HOSPITAL_COMMUNITY): Payer: Medicare Other

## 2015-04-10 ENCOUNTER — Inpatient Hospital Stay (HOSPITAL_COMMUNITY)
Admission: EM | Admit: 2015-04-10 | Discharge: 2015-04-22 | DRG: 280 | Disposition: A | Discharge status: Skilled Nursing Facility | Payer: Medicare Other | Attending: Internal Medicine | Admitting: Internal Medicine

## 2015-04-10 ENCOUNTER — Encounter (HOSPITAL_COMMUNITY): Payer: Self-pay

## 2015-04-10 DIAGNOSIS — R791 Abnormal coagulation profile: Secondary | ICD-10-CM | POA: Diagnosis not present

## 2015-04-10 DIAGNOSIS — R570 Cardiogenic shock: Secondary | ICD-10-CM | POA: Diagnosis not present

## 2015-04-10 DIAGNOSIS — Z885 Allergy status to narcotic agent status: Secondary | ICD-10-CM

## 2015-04-10 DIAGNOSIS — I214 Non-ST elevation (NSTEMI) myocardial infarction: Secondary | ICD-10-CM | POA: Diagnosis present

## 2015-04-10 DIAGNOSIS — E1121 Type 2 diabetes mellitus with diabetic nephropathy: Secondary | ICD-10-CM

## 2015-04-10 DIAGNOSIS — D6862 Lupus anticoagulant syndrome: Secondary | ICD-10-CM | POA: Diagnosis not present

## 2015-04-10 DIAGNOSIS — I2510 Atherosclerotic heart disease of native coronary artery without angina pectoris: Secondary | ICD-10-CM | POA: Diagnosis not present

## 2015-04-10 DIAGNOSIS — Z8673 Personal history of transient ischemic attack (TIA), and cerebral infarction without residual deficits: Secondary | ICD-10-CM | POA: Diagnosis not present

## 2015-04-10 DIAGNOSIS — R0902 Hypoxemia: Secondary | ICD-10-CM | POA: Diagnosis not present

## 2015-04-10 DIAGNOSIS — I5021 Acute systolic (congestive) heart failure: Secondary | ICD-10-CM | POA: Diagnosis not present

## 2015-04-10 DIAGNOSIS — I509 Heart failure, unspecified: Secondary | ICD-10-CM | POA: Diagnosis not present

## 2015-04-10 DIAGNOSIS — R42 Dizziness and giddiness: Secondary | ICD-10-CM | POA: Diagnosis present

## 2015-04-10 DIAGNOSIS — R069 Unspecified abnormalities of breathing: Secondary | ICD-10-CM | POA: Diagnosis not present

## 2015-04-10 DIAGNOSIS — E669 Obesity, unspecified: Secondary | ICD-10-CM | POA: Diagnosis present

## 2015-04-10 DIAGNOSIS — E875 Hyperkalemia: Secondary | ICD-10-CM | POA: Diagnosis not present

## 2015-04-10 DIAGNOSIS — R609 Edema, unspecified: Secondary | ICD-10-CM | POA: Diagnosis not present

## 2015-04-10 DIAGNOSIS — J189 Pneumonia, unspecified organism: Secondary | ICD-10-CM | POA: Diagnosis present

## 2015-04-10 DIAGNOSIS — E872 Acidosis, unspecified: Secondary | ICD-10-CM | POA: Diagnosis present

## 2015-04-10 DIAGNOSIS — Z66 Do not resuscitate: Secondary | ICD-10-CM | POA: Diagnosis present

## 2015-04-10 DIAGNOSIS — E1165 Type 2 diabetes mellitus with hyperglycemia: Secondary | ICD-10-CM | POA: Diagnosis not present

## 2015-04-10 DIAGNOSIS — J9600 Acute respiratory failure, unspecified whether with hypoxia or hypercapnia: Secondary | ICD-10-CM | POA: Diagnosis present

## 2015-04-10 DIAGNOSIS — I313 Pericardial effusion (noninflammatory): Secondary | ICD-10-CM | POA: Diagnosis not present

## 2015-04-10 DIAGNOSIS — I255 Ischemic cardiomyopathy: Secondary | ICD-10-CM | POA: Diagnosis present

## 2015-04-10 DIAGNOSIS — Z6830 Body mass index (BMI) 30.0-30.9, adult: Secondary | ICD-10-CM | POA: Diagnosis not present

## 2015-04-10 DIAGNOSIS — J810 Acute pulmonary edema: Secondary | ICD-10-CM | POA: Diagnosis present

## 2015-04-10 DIAGNOSIS — J9811 Atelectasis: Secondary | ICD-10-CM | POA: Diagnosis not present

## 2015-04-10 DIAGNOSIS — R7989 Other specified abnormal findings of blood chemistry: Secondary | ICD-10-CM | POA: Diagnosis not present

## 2015-04-10 DIAGNOSIS — I10 Essential (primary) hypertension: Secondary | ICD-10-CM | POA: Diagnosis present

## 2015-04-10 DIAGNOSIS — E785 Hyperlipidemia, unspecified: Secondary | ICD-10-CM | POA: Diagnosis not present

## 2015-04-10 DIAGNOSIS — I2583 Coronary atherosclerosis due to lipid rich plaque: Secondary | ICD-10-CM | POA: Diagnosis present

## 2015-04-10 DIAGNOSIS — Z89611 Acquired absence of right leg above knee: Secondary | ICD-10-CM | POA: Diagnosis not present

## 2015-04-10 DIAGNOSIS — Z888 Allergy status to other drugs, medicaments and biological substances status: Secondary | ICD-10-CM

## 2015-04-10 DIAGNOSIS — Z833 Family history of diabetes mellitus: Secondary | ICD-10-CM | POA: Diagnosis not present

## 2015-04-10 DIAGNOSIS — E876 Hypokalemia: Secondary | ICD-10-CM | POA: Diagnosis not present

## 2015-04-10 DIAGNOSIS — I5023 Acute on chronic systolic (congestive) heart failure: Secondary | ICD-10-CM | POA: Diagnosis not present

## 2015-04-10 DIAGNOSIS — R41841 Cognitive communication deficit: Secondary | ICD-10-CM | POA: Diagnosis not present

## 2015-04-10 DIAGNOSIS — Z7982 Long term (current) use of aspirin: Secondary | ICD-10-CM

## 2015-04-10 DIAGNOSIS — R2689 Other abnormalities of gait and mobility: Secondary | ICD-10-CM | POA: Diagnosis not present

## 2015-04-10 DIAGNOSIS — E119 Type 2 diabetes mellitus without complications: Secondary | ICD-10-CM

## 2015-04-10 DIAGNOSIS — Z87891 Personal history of nicotine dependence: Secondary | ICD-10-CM | POA: Diagnosis not present

## 2015-04-10 DIAGNOSIS — Z794 Long term (current) use of insulin: Secondary | ICD-10-CM

## 2015-04-10 DIAGNOSIS — R0602 Shortness of breath: Secondary | ICD-10-CM | POA: Diagnosis not present

## 2015-04-10 DIAGNOSIS — J9601 Acute respiratory failure with hypoxia: Secondary | ICD-10-CM | POA: Diagnosis not present

## 2015-04-10 DIAGNOSIS — E1151 Type 2 diabetes mellitus with diabetic peripheral angiopathy without gangrene: Secondary | ICD-10-CM | POA: Diagnosis present

## 2015-04-10 DIAGNOSIS — I5022 Chronic systolic (congestive) heart failure: Secondary | ICD-10-CM | POA: Diagnosis not present

## 2015-04-10 DIAGNOSIS — N179 Acute kidney failure, unspecified: Secondary | ICD-10-CM | POA: Diagnosis not present

## 2015-04-10 DIAGNOSIS — R748 Abnormal levels of other serum enzymes: Secondary | ICD-10-CM

## 2015-04-10 DIAGNOSIS — J90 Pleural effusion, not elsewhere classified: Secondary | ICD-10-CM | POA: Diagnosis not present

## 2015-04-10 DIAGNOSIS — R1311 Dysphagia, oral phase: Secondary | ICD-10-CM | POA: Diagnosis not present

## 2015-04-10 DIAGNOSIS — J811 Chronic pulmonary edema: Secondary | ICD-10-CM | POA: Diagnosis not present

## 2015-04-10 DIAGNOSIS — M6281 Muscle weakness (generalized): Secondary | ICD-10-CM | POA: Diagnosis not present

## 2015-04-10 DIAGNOSIS — R000 Tachycardia, unspecified: Secondary | ICD-10-CM | POA: Diagnosis present

## 2015-04-10 DIAGNOSIS — R05 Cough: Secondary | ICD-10-CM | POA: Diagnosis not present

## 2015-04-10 DIAGNOSIS — I5020 Unspecified systolic (congestive) heart failure: Secondary | ICD-10-CM

## 2015-04-10 DIAGNOSIS — IMO0001 Reserved for inherently not codable concepts without codable children: Secondary | ICD-10-CM | POA: Diagnosis present

## 2015-04-10 DIAGNOSIS — I272 Other secondary pulmonary hypertension: Secondary | ICD-10-CM | POA: Diagnosis present

## 2015-04-10 DIAGNOSIS — R2681 Unsteadiness on feet: Secondary | ICD-10-CM | POA: Diagnosis not present

## 2015-04-10 DIAGNOSIS — I252 Old myocardial infarction: Secondary | ICD-10-CM

## 2015-04-10 DIAGNOSIS — I739 Peripheral vascular disease, unspecified: Secondary | ICD-10-CM | POA: Diagnosis present

## 2015-04-10 LAB — BASIC METABOLIC PANEL
Anion gap: 15 (ref 5–15)
BUN: 26 mg/dL — AB (ref 6–20)
CALCIUM: 9.1 mg/dL (ref 8.9–10.3)
CO2: 20 mmol/L — ABNORMAL LOW (ref 22–32)
CREATININE: 1.49 mg/dL — AB (ref 0.44–1.00)
Chloride: 102 mmol/L (ref 101–111)
GFR, EST AFRICAN AMERICAN: 43 mL/min — AB (ref >60)
GFR, EST NON AFRICAN AMERICAN: 37 mL/min — AB (ref >60)
GLUCOSE: 493 mg/dL — AB (ref 65–99)
Potassium: 4.6 mmol/L (ref 3.5–5.1)
SODIUM: 137 mmol/L (ref 135–145)

## 2015-04-10 LAB — URINALYSIS, ROUTINE W REFLEX MICROSCOPIC
BILIRUBIN URINE: NEGATIVE
Glucose, UA: >1000 mg/dL — AB
KETONES UR: NEGATIVE mg/dL
Leukocytes, UA: NEGATIVE
Nitrite: NEGATIVE
Protein, ur: 100 mg/dL — AB
Specific Gravity, Urine: 1.011 (ref 1.005–1.030)
Urobilinogen, UA: 0.2 mg/dL (ref 0.0–1.0)
pH: 5 (ref 5.0–8.0)

## 2015-04-10 LAB — CBC
HCT: 33.4 % — ABNORMAL LOW (ref 36.0–46.0)
HEMATOCRIT: 34.2 % — AB (ref 36.0–46.0)
HEMOGLOBIN: 10.9 g/dL — AB (ref 12.0–15.0)
Hemoglobin: 10.7 g/dL — ABNORMAL LOW (ref 12.0–15.0)
MCH: 25.4 pg — AB (ref 26.0–34.0)
MCH: 25.5 pg — ABNORMAL LOW (ref 26.0–34.0)
MCHC: 31.9 g/dL (ref 30.0–36.0)
MCHC: 32 g/dL (ref 30.0–36.0)
MCV: 79.7 fL (ref 78.0–100.0)
MCV: 79.5 fL (ref 78.0–100.0)
PLATELETS: 381 10*3/uL (ref 150–400)
Platelets: 393 10*3/uL (ref 150–400)
RBC: 4.29 MIL/uL (ref 3.87–5.11)
RBC: 4.2 MIL/uL (ref 3.87–5.11)
RDW: 13.6 % (ref 11.5–15.5)
RDW: 13.5 % (ref 11.5–15.5)
WBC: 11.5 10*3/uL — AB (ref 4.0–10.5)
WBC: 10.3 10*3/uL (ref 4.0–10.5)

## 2015-04-10 LAB — URINE MICROSCOPIC-ADD ON

## 2015-04-10 LAB — I-STAT ARTERIAL BLOOD GAS, ED
Acid-base deficit: 8 mmol/L — ABNORMAL HIGH (ref 0.0–2.0)
Bicarbonate: 18.1 mEq/L — ABNORMAL LOW (ref 20.0–24.0)
O2 Saturation: 94 %
PCO2 ART: 37.5 mmHg (ref 35.0–45.0)
TCO2: 19 mmol/L (ref 0–100)
pH, Arterial: 7.292 — ABNORMAL LOW (ref 7.350–7.450)
pO2, Arterial: 78 mmHg — ABNORMAL LOW (ref 80.0–100.0)

## 2015-04-10 LAB — I-STAT TROPONIN, ED: Troponin i, poc: 0.15 ng/mL (ref 0.00–0.08)

## 2015-04-10 LAB — GLUCOSE, CAPILLARY
GLUCOSE-CAPILLARY: 407 mg/dL — AB (ref 65–99)
Glucose-Capillary: 307 mg/dL — ABNORMAL HIGH (ref 65–99)
Glucose-Capillary: 133 mg/dL — ABNORMAL HIGH (ref 65–99)

## 2015-04-10 LAB — D-DIMER, QUANTITATIVE: D-Dimer, Quant: 0.85 ug/mL-FEU — ABNORMAL HIGH (ref 0.00–0.48)

## 2015-04-10 LAB — CREATININE, SERUM
Creatinine, Ser: 1.51 mg/dL — ABNORMAL HIGH (ref 0.44–1.00)
GFR calc Af Amer: 42 mL/min — ABNORMAL LOW (ref >60)
GFR calc non Af Amer: 36 mL/min — ABNORMAL LOW (ref >60)

## 2015-04-10 LAB — TROPONIN I
Troponin I: 0.3 ng/mL — ABNORMAL HIGH (ref <0.031)
Troponin I: 0.5 ng/mL (ref <0.031)

## 2015-04-10 LAB — GLUCOSE, RANDOM: Glucose, Bld: 241 mg/dL — ABNORMAL HIGH (ref 65–99)

## 2015-04-10 LAB — HEPARIN LEVEL (UNFRACTIONATED): Heparin Unfractionated: 0.37 IU/mL (ref 0.30–0.70)

## 2015-04-10 LAB — PROCALCITONIN: Procalcitonin: 0.13 ng/mL

## 2015-04-10 LAB — I-STAT CG4 LACTIC ACID, ED: Lactic Acid, Venous: 1.96 mmol/L (ref 0.5–2.0)

## 2015-04-10 LAB — BRAIN NATRIURETIC PEPTIDE: B Natriuretic Peptide: 395.3 pg/mL — ABNORMAL HIGH (ref 0.0–100.0)

## 2015-04-10 LAB — LACTIC ACID, PLASMA: Lactic Acid, Venous: 1.7 mmol/L (ref 0.5–2.0)

## 2015-04-10 MED ORDER — INSULIN ASPART 100 UNIT/ML ~~LOC~~ SOLN: 0.0000 [IU] | Freq: Three times a day (TID) | SUBCUTANEOUS | Status: DC

## 2015-04-10 MED ORDER — CEFTRIAXONE SODIUM IN DEXTROSE 20 MG/ML IV SOLN
1.0000 g | INTRAVENOUS | Status: AC
  Administered 2015-04-11 – 2015-04-16 (×6): 1 g via INTRAVENOUS
  Filled 2015-04-10 (×6): qty 50

## 2015-04-10 MED ORDER — INSULIN ASPART 100 UNIT/ML ~~LOC~~ SOLN
3.0000 [IU] | Freq: Three times a day (TID) | SUBCUTANEOUS | Status: DC
  Administered 2015-04-11 – 2015-04-12 (×6): 3 [IU] via SUBCUTANEOUS

## 2015-04-10 MED ORDER — ZINC SULFATE 220 (50 ZN) MG PO CAPS
220.0000 mg | ORAL_CAPSULE | Freq: Every day | ORAL | Status: DC
  Administered 2015-04-10 – 2015-04-13 (×4): 220 mg via ORAL
  Filled 2015-04-10 (×4): qty 1

## 2015-04-10 MED ORDER — HEPARIN BOLUS VIA INFUSION
4000.0000 [IU] | Freq: Once | INTRAVENOUS | Status: AC
  Administered 2015-04-10: 4000 [IU] via INTRAVENOUS
  Filled 2015-04-10: qty 4000

## 2015-04-10 MED ORDER — MECLIZINE HCL 25 MG PO TABS
25.0000 mg | ORAL_TABLET | Freq: Every day | ORAL | Status: DC | PRN
  Administered 2015-04-10: 25 mg via ORAL
  Filled 2015-04-10 (×2): qty 1

## 2015-04-10 MED ORDER — VITAMIN C 500 MG PO TABS
500.0000 mg | ORAL_TABLET | Freq: Every day | ORAL | Status: DC
  Administered 2015-04-10 – 2015-04-13 (×4): 500 mg via ORAL
  Filled 2015-04-10 (×4): qty 1

## 2015-04-10 MED ORDER — FUROSEMIDE 10 MG/ML IJ SOLN
40.0000 mg | Freq: Every day | INTRAMUSCULAR | Status: DC
  Administered 2015-04-11: 40 mg via INTRAVENOUS
  Filled 2015-04-10: qty 4

## 2015-04-10 MED ORDER — SODIUM CHLORIDE 0.9 % IV SOLN: 250.0000 mL | INTRAVENOUS | Status: DC | PRN

## 2015-04-10 MED ORDER — SODIUM CHLORIDE 0.9 % IJ SOLN
3.0000 mL | INTRAMUSCULAR | Status: DC | PRN
  Administered 2015-04-13: 3 mL via INTRAVENOUS
  Filled 2015-04-10: qty 3

## 2015-04-10 MED ORDER — DEXTROSE 5 % IV SOLN
500.0000 mg | INTRAVENOUS | Status: DC
  Administered 2015-04-11 – 2015-04-13 (×3): 500 mg via INTRAVENOUS
  Filled 2015-04-10 (×4): qty 500

## 2015-04-10 MED ORDER — INSULIN ASPART 100 UNIT/ML ~~LOC~~ SOLN
0.0000 [IU] | Freq: Three times a day (TID) | SUBCUTANEOUS | Status: DC
  Administered 2015-04-11: 8 [IU] via SUBCUTANEOUS
  Administered 2015-04-11: 15 [IU] via SUBCUTANEOUS
  Administered 2015-04-11: 5 [IU] via SUBCUTANEOUS
  Administered 2015-04-12: 3 [IU] via SUBCUTANEOUS
  Administered 2015-04-12: 5 [IU] via SUBCUTANEOUS
  Administered 2015-04-12: 11 [IU] via SUBCUTANEOUS

## 2015-04-10 MED ORDER — ONDANSETRON HCL 4 MG/2ML IJ SOLN: 4.0000 mg | Freq: Four times a day (QID) | INTRAMUSCULAR | Status: DC | PRN

## 2015-04-10 MED ORDER — INSULIN GLARGINE 100 UNIT/ML ~~LOC~~ SOLN
6.0000 [IU] | Freq: Every day | SUBCUTANEOUS | Status: DC
  Administered 2015-04-10: 6 [IU] via SUBCUTANEOUS
  Filled 2015-04-10 (×2): qty 0.06

## 2015-04-10 MED ORDER — NITROGLYCERIN 2 % TD OINT
1.0000 [in_us] | TOPICAL_OINTMENT | Freq: Four times a day (QID) | TRANSDERMAL | Status: DC
  Administered 2015-04-10 – 2015-04-14 (×15): 1 [in_us] via TOPICAL
  Filled 2015-04-10: qty 1
  Filled 2015-04-10: qty 30

## 2015-04-10 MED ORDER — INSULIN ASPART 100 UNIT/ML ~~LOC~~ SOLN
15.0000 [IU] | Freq: Once | SUBCUTANEOUS | Status: AC
  Administered 2015-04-10: 15 [IU] via SUBCUTANEOUS

## 2015-04-10 MED ORDER — FUROSEMIDE 10 MG/ML IJ SOLN
40.0000 mg | Freq: Once | INTRAMUSCULAR | Status: AC
  Administered 2015-04-10: 40 mg via INTRAVENOUS
  Filled 2015-04-10: qty 4

## 2015-04-10 MED ORDER — PRAVASTATIN SODIUM 80 MG PO TABS
80.0000 mg | ORAL_TABLET | Freq: Every day | ORAL | Status: DC
  Administered 2015-04-10 – 2015-04-11 (×2): 80 mg via ORAL
  Filled 2015-04-10 (×3): qty 1

## 2015-04-10 MED ORDER — ASPIRIN 81 MG PO CHEW
81.0000 mg | CHEWABLE_TABLET | Freq: Every day | ORAL | Status: DC
  Administered 2015-04-11 – 2015-04-22 (×11): 81 mg via ORAL
  Filled 2015-04-10 (×11): qty 1

## 2015-04-10 MED ORDER — SODIUM CHLORIDE 0.9 % IJ SOLN
3.0000 mL | Freq: Two times a day (BID) | INTRAMUSCULAR | Status: DC
  Administered 2015-04-10 – 2015-04-16 (×9): 3 mL via INTRAVENOUS

## 2015-04-10 MED ORDER — ALPRAZOLAM 0.25 MG PO TABS: 0.2500 mg | ORAL_TABLET | Freq: Three times a day (TID) | ORAL | Status: DC | PRN

## 2015-04-10 MED ORDER — INSULIN ASPART 100 UNIT/ML ~~LOC~~ SOLN: 0.0000 [IU] | Freq: Every day | SUBCUTANEOUS | Status: DC

## 2015-04-10 MED ORDER — HEPARIN (PORCINE) IN NACL 100-0.45 UNIT/ML-% IJ SOLN
1200.0000 [IU]/h | INTRAMUSCULAR | Status: DC
  Administered 2015-04-10: 950 [IU]/h via INTRAVENOUS
  Administered 2015-04-11 – 2015-04-13 (×3): 1050 [IU]/h via INTRAVENOUS
  Filled 2015-04-10 (×8): qty 250

## 2015-04-10 MED ORDER — ENOXAPARIN SODIUM 30 MG/0.3ML ~~LOC~~ SOLN: 30.0000 mg | SUBCUTANEOUS | Status: DC

## 2015-04-10 MED ORDER — DEXTROSE 5 % IV SOLN
500.0000 mg | Freq: Once | INTRAVENOUS | Status: AC
  Administered 2015-04-10: 500 mg via INTRAVENOUS
  Filled 2015-04-10: qty 500

## 2015-04-10 MED ORDER — CARVEDILOL 3.125 MG PO TABS
3.1250 mg | ORAL_TABLET | Freq: Two times a day (BID) | ORAL | Status: DC
  Administered 2015-04-10 – 2015-04-12 (×4): 3.125 mg via ORAL
  Filled 2015-04-10 (×6): qty 1

## 2015-04-10 MED ORDER — DEXTROSE 5 % IV SOLN
1.0000 g | Freq: Once | INTRAVENOUS | Status: AC
  Administered 2015-04-10: 1 g via INTRAVENOUS
  Filled 2015-04-10: qty 10

## 2015-04-10 NOTE — ED Notes (Signed)
Pt 90% on 15L NRB.  Respiratory called to come evaluate pt.

## 2015-04-10 NOTE — Progress Notes (Signed)
CRITICAL VALUE ALERT  Critical value received:  Troponin 0.50  Date of notification:  04/10/15  Time of notification:  1935  Critical value read back:Yes.    Nurse who received alert:  Tyson Babinski  MD notified (1st page):  Cecilie Kicks  Time of first page:  1940  Responding MD:  Cecilie Kicks  Time MD responded:  [phone]

## 2015-04-10 NOTE — ED Provider Notes (Addendum)
CSN: [id number]     Arrival date & time 04/10/15  [id number] History   First MD Initiated Contact with Patient 04/10/15 [phone]     Chief Complaint  Patient presents with  . Respiratory Distress   HPI Patient presents to the emergency room with complaints of shortness of breath. Patient states the symptoms started about 3 days ago and have been gradually getting more severe. Any movement makes the symptoms worse. She has also noticed some swelling in her left lower extremity. This morning the symptoms were much more severe and she had to call EMS. Patient denies any fevers or chills. Some coughing. No history of CHF or COPD. She also has a sensation of discomfort in her left upper abdomen. It makes it feel like it is harder for her to breathe. Patient denies any trouble with nausea or vomiting. No issues with her appetite.  Past Medical History  Diagnosis Date  . Hyperlipidemia     takes Simvastatin daily  . Peripheral vascular disease   . Stroke     TIA history  . PONV (postoperative nausea and vomiting)   . Hypertension     takes Amlodipine/HCTZ/Losartan daily  . Vertigo     takes Ativert prn  . Hemorrhoids   . Anxiety     takes Xanax prn  . Myocardial infarction 1990's  . Type II diabetes mellitus     takes Glucovance and Januvia daily  . H/O hiatal hernia   . GERD (gastroesophageal reflux disease)   . Headache(784.0)     when b/p is elevated  . Migraines   . Arthritis    Past Surgical History  Procedure Laterality Date  . Cleft palate repair      several  . Pr vein bypass graft,aorto-fem-pop  05/27/11    Right SFA-Below knee Pop BP  . Tonsillectomy    . Dilation and curettage of uterus    . Aortogram  02/2012  . Colonoscopy    . Femoral-tibial bypass graft  03/29/2012    Procedure: BYPASS GRAFT FEMORAL-TIBIAL ARTERY;  Surgeon: [name], MD;  Location: [facility] OR;  Service: Vascular;  Laterality: Right;  Right femoral to Posterior Tibialis with composite graft of 46mm x 80 cm  ringed gortex graft and saphenous vein  ,intraoperative arteriogram  . Femoral-popliteal bypass graft  04/05/2012    Procedure: BYPASS GRAFT FEMORAL-POPLITEAL ARTERY;  Surgeon: [name], MD;  Location: [facility];  Service: Vascular;  Laterality: Right;  REVISION  . Myomectomy    . Tubal ligation  ~ 1990  . Femoropopliteal thrombectomy / embolectomy  05/09/2012  . Amputation  05/10/2012    Procedure: AMPUTATION ABOVE KNEE;  Surgeon: [name], MD;  Location: [facility] OR;  Service: Vascular;  Laterality: Right;   open right groin wound noted  . Abdominal aortagram N/A 02/29/2012    Procedure: ABDOMINAL Maxcine Ham;  Surgeon: [name], MD;  Location: [facility] CATH LAB;  Service: Cardiovascular;  Laterality: N/A;   Family History  Problem Relation Age of Onset  . Cancer Mother     breast  . Diabetes Father    History  Substance Use Topics  . Smoking status: Former Smoker -- 0.25 packs/day for 40 years    Types: Cigarettes    Quit date: 02/27/2012  . Smokeless tobacco: Never Used  . Alcohol Use: No   OB History    No data available     Review of Systems  All other systems reviewed and are negative.  Allergies  Plavix; Codeine; Tylenol; and Tylenol with codeine #3  Home Medications   Prior to Admission medications   Medication Sig Start Date End Date Taking? Authorizing Provider  ALPRAZolam (XANAX) 0.25 MG tablet Take 0.25 mg by mouth 3 (three) times daily as needed for anxiety.    Yes Historical Provider, MD  amLODipine (NORVASC) 10 MG tablet Take 10 mg by mouth daily.  01/30/12  Yes Historical Provider, MD  aspirin 81 MG chewable tablet Chew 81 mg by mouth daily.   Yes Historical Provider, MD  glipiZIDE-metformin (METAGLIP) 5-500 MG per tablet Take 2 tablets by mouth 2 (two) times daily. 03/31/15  Yes Historical Provider, MD  hydrochlorothiazide (HYDRODIURIL) 25 MG tablet Take 25 mg by mouth daily.  02/09/12  Yes Historical Provider, MD  ibuprofen (ADVIL,MOTRIN) 200 MG tablet  Take 400 mg by mouth every 6 (six) hours as needed for mild pain or moderate pain.   Yes Historical Provider, MD  LIVALO 4 MG TABS Take 4 mg by mouth daily. 03/04/15  Yes Historical Provider, MD  losartan (COZAAR) 100 MG tablet Take 100 mg by mouth daily.  01/30/12  Yes Historical Provider, MD  meclizine (ANTIVERT) 25 MG tablet Take 25 mg by mouth daily as needed for dizziness.    Yes Historical Provider, MD  vitamin C (ASCORBIC ACID) 500 MG tablet Take 500 mg by mouth daily.   Yes Historical Provider, MD  zinc sulfate 220 MG capsule Take 220 mg by mouth daily.   Yes Historical Provider, MD  atorvastatin (LIPITOR) 20 MG tablet Take 1 tablet (20 mg total) by mouth daily at 6 PM. Patient not taking: Reported on 04/10/2015 05/14/12 04/09/16  Aldona Bar J Rhyne, PA-C   BP 137/76 mmHg  Pulse 127  Temp(Src) 98 F (36.7 C) (Oral)  Resp 24  Ht 5\' 1"  (1.549 m)  Wt 160 lb (72.576 kg)  BMI 30.25 kg/m2  SpO2 95% Physical Exam  Constitutional: She appears distressed.  HENT:  Head: Normocephalic and atraumatic.  Right Ear: External ear normal.  Left Ear: External ear normal.  Still able to speak in full sentences while  Eyes: Conjunctivae are normal. Right eye exhibits no discharge. Left eye exhibits no discharge. No scleral icterus.  Neck: Neck supple. No tracheal deviation present.  Cardiovascular: Normal rate, regular rhythm and intact distal pulses.   Pulmonary/Chest: Effort normal. No stridor. Tachypnea noted. No respiratory distress. She has no wheezes. She has rales (diffuse crackles).  Abdominal: Soft. Bowel sounds are normal. She exhibits no distension. There is no tenderness. There is no rebound and no guarding.  Musculoskeletal: She exhibits edema. She exhibits no tenderness.  S/p bka rle, pitting edema lle  Neurological: She is alert. She has normal strength. No cranial nerve deficit (no facial droop, extraocular movements intact, no slurred speech) or sensory deficit. She exhibits normal  muscle tone. She displays no seizure activity. Coordination normal.  Skin: Skin is warm and dry. No rash noted.  Psychiatric: She has a normal mood and affect.  Nursing note and vitals reviewed.   ED Course  Procedures (including critical care time) CRITICAL CARE Performed by: [id number] Total critical care time: 35 Critical care time was exclusive of separately billable procedures and treating other patients. Critical care was necessary to treat or prevent imminent or life-threatening deterioration. Critical care was time spent personally by me on the following activities: development of treatment plan with patient and/or surrogate as well as nursing, discussions with consultants, evaluation of patient's response to treatment,  examination of patient, obtaining history from patient or surrogate, ordering and performing treatments and interventions, ordering and review of laboratory studies, ordering and review of radiographic studies, pulse oximetry and re-evaluation of patient's condition.   Labs Review  Labs Reviewed  CBC - Abnormal; Notable for the following:    WBC 11.5 (*)    Hemoglobin 10.9 (*)    HCT 34.2 (*)    MCH 25.4 (*)    All other components within normal limits  BASIC METABOLIC PANEL - Abnormal; Notable for the following:    CO2 20 (*)    Glucose, Bld 493 (*)    BUN 26 (*)    Creatinine, Ser 1.49 (*)    GFR calc non Af Amer 37 (*)    GFR calc Af Amer 43 (*)    All other components within normal limits  D-DIMER, QUANTITATIVE (NOT AT [facility]) - Abnormal; Notable for the following:    D-Dimer, Quant 0.85 (*)    All other components within normal limits  BRAIN NATRIURETIC PEPTIDE - Abnormal; Notable for the following:    B Natriuretic Peptide 395.3 (*)    All other components within normal limits  I-STAT TROPOININ, ED - Abnormal; Notable for the following:    Troponin i, poc 0.15 (*)    All other components within normal limits  I-STAT ARTERIAL BLOOD GAS, ED -  Abnormal; Notable for the following:    pH, Arterial 7.292 (*)    pO2, Arterial 78.0 (*)    Bicarbonate 18.1 (*)    Acid-base deficit 8.0 (*)    All other components within normal limits  Randolm Idol, ED    Imaging Review Dg Chest Port 1 View  04/10/2015   CLINICAL DATA:  Shortness of breath for 3 days. Cough for 3 days. Former smoker.  EXAM: PORTABLE CHEST - 1 VIEW  COMPARISON:  05/10/2012  FINDINGS: The cardiac silhouette is obscured. Extremely diminished lung volumes with increased density at both lung bases and crowding/ accentuation of the bronchovascular markings in the perihilar and apical regions. No pneumothorax. No acute osseous abnormality is identified.  IMPRESSION: Low lung volumes with increased density in the lung bases, which may represent a combination of atelectasis or consolidation and pleural effusions. Dedicated two-view chest radiographs recommended when clinically able.   Electronically Signed   By: Logan Bores   On: 04/10/2015 09:16     EKG Interpretation   Date/Time:  Friday April 10 2015 08:41:29 EDT Ventricular Rate:  134 PR Interval:  125 QRS Duration: 86 QT Interval:  317 QTC Calculation: 473 R Axis:   105 Text Interpretation:  Sinus tachycardia Right axis deviation Anteroseptal  infarct, old Nonspecific repol abnormality, lateral leads Since last  tracing rate faster Confirmed by Estalene Bergey  MD-J, Haja Crego [id number]) on 04/10/2015  8:46:45 AM     Medications  nitroGLYCERIN (NITROGLYN) 2 % ointment 1 inch (1 inch Topical Given 04/10/15 1008)  heparin bolus via infusion 4,000 Units (not administered)  heparin ADULT infusion 100 units/mL (25000 units/250 mL) (not administered)  cefTRIAXone (ROCEPHIN) 1 g in dextrose 5 % 50 mL IVPB (not administered)  azithromycin (ZITHROMAX) 500 mg in dextrose 5 % 250 mL IVPB (not administered)  furosemide (LASIX) injection 40 mg (40 mg Intravenous Given 04/10/15 1008)    MDM   Final diagnoses:  Acute exacerbation of CHF  (congestive heart failure)  Cardiac enzymes elevated    Slight increase in anion gap.  Decreased bicarb.  May be related to her acute renal  dysfunction.  Patient's ABG suggestive of primarily a metabolic acidosis. There is a component of hypoxia.  Patient's x-ray is suggestive of congestive heart failure. Cannot exclude a consolidation however she is not having any fever or other symptoms to suggest acute infection.  Patient's BNP is mildly elevated at 395.  Her d-dimer is also slightly elevated.  I think the patient will need diuresis and further treatment. Not a good candidate with her renal dyfunction for CT scanning right now.  VQ would be dfficulty to interpret.  I think her CXR findings are enough to account for her breathing issues.  Doubt that further pe workup is necessary at this time.  Plan on admission.  Will consult with cardiology considering her elevated enzymes.   Dorie Rank, MD 04/10/15 1045  1100 Pt was lying flat to use the bed pan.  Her dyspnea became much more severe.  Will try bipap to help her with her breathing.  She is better now sitting up.  Will need to insert foley.  Continue to monitor closely.  At this point, she is not altered and is protecting airway.  Not struggling.  Intubation not indicated at this time.  Dorie Rank, MD 04/10/15 1058  Pt seen by Dr Acie Fredrickson.  Concerned that her symptoms are more pulmonary in nature.   Requests pulm critical care evaluation and primary admission.  Dorie Rank, MD 04/10/15 1124  Discussed with Dr Pia Mau. PCCM will evaluate the patient.  Requests hospitalist admission.  Empiric abx added to cover possible CAP.    Dorie Rank, MD 04/10/15 1136

## 2015-04-10 NOTE — Progress Notes (Signed)
2nd Troponin .50. Pt is already on a Heparin drip for prophylaxis of ACS and PE. Further tests in am.  KJKG, NP [facility]

## 2015-04-10 NOTE — Progress Notes (Signed)
ANTICOAGULATION CONSULT NOTE - Initial Consult  Pharmacy Consult for Heparin Indication: chest pain/ACS  Allergies  Allergen Reactions  . Plavix [Clopidogrel Bisulfate] Itching  . Codeine Other (See Comments)    "makes me feel strange"  . Tylenol With Codeine #3 [Acetaminophen-Codeine] Other (See Comments)    "doesn't make me feel right"  . Tylenol [Acetaminophen] Other (See Comments)    "Doesn't feel right"     Patient Measurements: Height: 5\' 1"  (154.9 cm) Weight: 160 lb (72.576 kg) IBW/kg (Calculated) : 47.8 Heparin Dosing Weight: 73 kg   Vital Signs: Temp: 98 F (36.7 C) (07/22 0848) Temp Source: Oral (07/22 0848) BP: 110/72 mmHg (07/22 1113) Pulse Rate: 120 (07/22 1113)  Labs:  Recent Labs  04/10/15 0850  HGB 10.9*  HCT 34.2*  PLT 381  CREATININE 1.49*    Estimated Creatinine Clearance: 36.1 mL/min (by C-G formula based on Cr of 1.49).   Medical History: Past Medical History  Diagnosis Date  . Hyperlipidemia     takes Simvastatin daily  . Peripheral vascular disease   . Stroke     TIA history  . PONV (postoperative nausea and vomiting)   . Hypertension     takes Amlodipine/HCTZ/Losartan daily  . Vertigo     takes Ativert prn  . Hemorrhoids   . Anxiety     takes Xanax prn  . Myocardial infarction 1990's  . Type II diabetes mellitus     takes Glucovance and Januvia daily  . H/O hiatal hernia   . GERD (gastroesophageal reflux disease)   . Headache(784.0)     when b/p is elevated  . Migraines   . Arthritis     Medications:   (Not in a hospital admission)  Assessment: 66 YOF who presented to the ED with shortness of breath that started 3 days ago. Her troponin is mildly elevated. Pharmacy consulted to start IV heparin for ACS. Cardiology concerned that symptoms are more pulmonary in nature. H/H mildly low, Plt wnl. Patient is not on any anticoagulants prior to admission.   Goal of Therapy:  Heparin level 0.3-0.7 units/ml Monitor platelets  by anticoagulation protocol: Yes   Plan:  -Give heparin IV 4000 units bolus followed by infusion at 950 units/hr -F/u 6 hr HL -Monitor daily CBC, HL, and s/s of bleeding  -F/u cardiology recommendations   Albertina Parr, PharmD., BCPS Clinical Pharmacist Pager [phone]

## 2015-04-10 NOTE — Progress Notes (Signed)
ANTICOAGULATION CONSULT NOTE - [facility] for Heparin Indication: chest pain/ACS  Allergies  Allergen Reactions  . Plavix [Clopidogrel Bisulfate] Itching  . Codeine Other (See Comments)    "makes me feel strange"  . Tylenol With Codeine #3 [Acetaminophen-Codeine] Other (See Comments)    "doesn't make me feel right"  . Tylenol [Acetaminophen] Other (See Comments)    "Doesn't feel right"     Patient Measurements: Height: 5\' 1"  (154.9 cm) Weight: 160 lb (72.576 kg) IBW/kg (Calculated) : 47.8 Heparin Dosing Weight: 73 kg   Vital Signs: Temp: 97.8 F (36.6 C) (07/22 1602) Temp Source: Oral (07/22 1602) BP: 110/79 mmHg (07/22 1800) Pulse Rate: 112 (07/22 1900)  Labs:  Recent Labs  04/10/15 0850 04/10/15 1227 04/10/15 1824  HGB 10.9*  --  10.7*  HCT 34.2*  --  33.4*  PLT 381  --  393  HEPARINUNFRC  --   --  0.37  CREATININE 1.49*  --   --   TROPONINI  --  0.30*  --     Estimated Creatinine Clearance: 36.1 mL/min (by C-G formula based on Cr of 1.49).   Medical History: Past Medical History  Diagnosis Date  . Hyperlipidemia     takes Simvastatin daily  . Peripheral vascular disease   . Stroke     TIA history  . PONV (postoperative nausea and vomiting)   . Hypertension     takes Amlodipine/HCTZ/Losartan daily  . Vertigo     takes Ativert prn  . Hemorrhoids   . Anxiety     takes Xanax prn  . Myocardial infarction 1990's  . Type II diabetes mellitus     takes Glucovance and Januvia daily  . H/O hiatal hernia   . GERD (gastroesophageal reflux disease)   . Headache(784.0)     when b/p is elevated  . Migraines   . Arthritis     Medications:  Prescriptions prior to admission  Medication Sig Dispense Refill Last Dose  . ALPRAZolam (XANAX) 0.25 MG tablet Take 0.25 mg by mouth 3 (three) times daily as needed for anxiety.    Past Week at Unknown time  . amLODipine (NORVASC) 10 MG tablet Take 10 mg by mouth daily.    04/09/2015 at  Unknown time  . aspirin 81 MG chewable tablet Chew 81 mg by mouth daily.   04/09/2015 at Unknown time  . glipiZIDE-metformin (METAGLIP) 5-500 MG per tablet Take 2 tablets by mouth 2 (two) times daily.  0 04/09/2015 at Unknown time  . hydrochlorothiazide (HYDRODIURIL) 25 MG tablet Take 25 mg by mouth daily.    04/09/2015 at Unknown time  . ibuprofen (ADVIL,MOTRIN) 200 MG tablet Take 400 mg by mouth every 6 (six) hours as needed for mild pain or moderate pain.   04/09/2015 at Unknown time  . LIVALO 4 MG TABS Take 4 mg by mouth daily.  0 04/09/2015 at Unknown time  . losartan (COZAAR) 100 MG tablet Take 100 mg by mouth daily.    04/09/2015 at Unknown time  . meclizine (ANTIVERT) 25 MG tablet Take 25 mg by mouth daily as needed for dizziness.    Past Week at Unknown time  . vitamin C (ASCORBIC ACID) 500 MG tablet Take 500 mg by mouth daily.   04/09/2015 at Unknown time  . zinc sulfate 220 MG capsule Take 220 mg by mouth daily.   04/09/2015 at Unknown time  . atorvastatin (LIPITOR) 20 MG tablet Take 1 tablet (20 mg total) by mouth  daily at 6 PM. (Patient not taking: Reported on 04/10/2015) 30 tablet 2 Not Taking at Unknown time    Assessment: 64yo female presented to the ED with SOB that started 3 days ago. Her troponin is mildly elevated. Pharmacy consulted to start IV heparin for ACS, r/o PE. Cardiology concerned that symptoms are more pulmonary in nature. H/H mildly low but stable, PLT WNL, no bleeding noted. Patient not on any anticoagulants prior to admission.  HL therapeutic x1 (0.37) while running 950 units/hr.  Goal of Therapy:  Heparin level 0.3-0.7 units/ml Monitor platelets by anticoagulation protocol: Yes   Plan:  -Continue heparin drip 950 units/hr -Check 6 hr HL to confirm -Monitor daily CBC, HL, and s/sx of bleeding  -F/u cardiology recommendations   Drucie Opitz, PharmD Clinical Pharmacist Pager: [phone] 04/10/2015 7:20 PM

## 2015-04-10 NOTE — ED Notes (Signed)
Pt brought in by EMS for SOB and LLQ abdominal pain that began three days ago.  Pt denies any chest pain or n/v/d.  Pt O2 sat was 85% on RA PTA pt O2 sat is 89% on 15L NRB.  Respiratory at bedside.  Pt is A&Ox4

## 2015-04-10 NOTE — Consult Note (Signed)
CONSULT NOTE  Date: 04/10/2015               Patient Name:  Jenna Brown MRN: [id number]  DOB: 1951/04/20 Age / Sex: 64 y.o., female        PCP: [name] Primary Cardiologist: Wyvonne Lenz             Referring Physician: Hillard Danker              Reason for Consult: Dyspnea, + troponin, elevated D-dimer            History of Present Illness: Patient is a 64 y.o. female with a PMHx of severe PVD, hyperlipidemia, TIA, essential HTN, CKD, , who was admitted to [facility] on 04/10/2015 for evaluation of dyspnea. .   She has BIPAP in place.  Her sister provided much of the history   3 days of progressive dyspnea + cough PND, orthopnea for the past several days   Denies fever,  Has not been eating any extra salt as far as the sister knows.  No CP,  No pleuretic CP  , no hemoptysis  Has diabetes.    Medications: Outpatient medications:  (Not in a hospital admission)  Current medications: Current Facility-Administered Medications  Medication Dose Route Frequency Provider Last Rate Last Dose  . nitroGLYCERIN (NITROGLYN) 2 % ointment 1 inch  1 inch Topical 4 times per day Dorie Rank, MD   1 inch at 04/10/15 1008   Current Outpatient Prescriptions  Medication Sig Dispense Refill  . ALPRAZolam (XANAX) 0.25 MG tablet Take 0.25 mg by mouth 3 (three) times daily as needed for anxiety.     Marland Kitchen amLODipine (NORVASC) 10 MG tablet Take 10 mg by mouth daily.     Marland Kitchen aspirin 81 MG chewable tablet Chew 81 mg by mouth daily.    Marland Kitchen glipiZIDE-metformin (METAGLIP) 5-500 MG per tablet Take 2 tablets by mouth 2 (two) times daily.  0  . hydrochlorothiazide (HYDRODIURIL) 25 MG tablet Take 25 mg by mouth daily.     Marland Kitchen ibuprofen (ADVIL,MOTRIN) 200 MG tablet Take 400 mg by mouth every 6 (six) hours as needed for mild pain or moderate pain.    Marland Kitchen LIVALO 4 MG TABS Take 4 mg by mouth daily.  0  . losartan (COZAAR) 100 MG tablet Take 100 mg by mouth daily.     . meclizine (ANTIVERT) 25 MG tablet  Take 25 mg by mouth daily as needed for dizziness.     . vitamin C (ASCORBIC ACID) 500 MG tablet Take 500 mg by mouth daily.    Marland Kitchen zinc sulfate 220 MG capsule Take 220 mg by mouth daily.    Marland Kitchen atorvastatin (LIPITOR) 20 MG tablet Take 1 tablet (20 mg total) by mouth daily at 6 PM. (Patient not taking: Reported on 04/10/2015) 30 tablet 2     Allergies  Allergen Reactions  . Plavix [Clopidogrel Bisulfate] Itching  . Codeine Other (See Comments)    "makes me feel strange"  . Tylenol With Codeine #3 [Acetaminophen-Codeine] Other (See Comments)    "doesn't make me feel right"  . Tylenol [Acetaminophen] Other (See Comments)    "Doesn't feel right"      Past Medical History  Diagnosis Date  . Hyperlipidemia     takes Simvastatin daily  . Peripheral vascular disease   . Stroke     TIA history  . PONV (postoperative nausea and vomiting)   . Hypertension     takes  Amlodipine/HCTZ/Losartan daily  . Vertigo     takes Ativert prn  . Hemorrhoids   . Anxiety     takes Xanax prn  . Myocardial infarction 1990's  . Type II diabetes mellitus     takes Glucovance and Januvia daily  . H/O hiatal hernia   . GERD (gastroesophageal reflux disease)   . Headache(784.0)     when b/p is elevated  . Migraines   . Arthritis     Past Surgical History  Procedure Laterality Date  . Cleft palate repair      several  . Pr vein bypass graft,aorto-fem-pop  05/27/11    Right SFA-Below knee Pop BP  . Tonsillectomy    . Dilation and curettage of uterus    . Aortogram  02/2012  . Colonoscopy    . Femoral-tibial bypass graft  03/29/2012    Procedure: BYPASS GRAFT FEMORAL-TIBIAL ARTERY;  Surgeon: [name], MD;  Location: [facility] OR;  Service: Vascular;  Laterality: Right;  Right femoral to Posterior Tibialis with composite graft of 29mm x 80 cm ringed gortex graft and saphenous vein  ,intraoperative arteriogram  . Femoral-popliteal bypass graft  04/05/2012    Procedure: BYPASS GRAFT FEMORAL-POPLITEAL ARTERY;   Surgeon: [name], MD;  Location: [facility];  Service: Vascular;  Laterality: Right;  REVISION  . Myomectomy    . Tubal ligation  ~ 1990  . Femoropopliteal thrombectomy / embolectomy  05/09/2012  . Amputation  05/10/2012    Procedure: AMPUTATION ABOVE KNEE;  Surgeon: [name], MD;  Location: [facility] OR;  Service: Vascular;  Laterality: Right;   open right groin wound noted  . Abdominal aortagram N/A 02/29/2012    Procedure: ABDOMINAL Maxcine Ham;  Surgeon: [name], MD;  Location[facility] CATH LAB;  Service: Cardiovascular;  Laterality: N/A;    Family History  Problem Relation Age of Onset  . Cancer Mother     breast  . Diabetes Father     Social History:  reports that she quit smoking about 3 years ago. Her smoking use included Cigarettes. She has a 10 pack-year smoking history. She has never used smokeless tobacco. She reports that she does not drink alcohol or use illicit drugs.   Review of Systems: Constitutional:  denies fever, chills, diaphoresis, appetite change and fatigue.  HEENT: denies photophobia, eye pain, redness, hearing loss, ear pain, congestion, sore throat, rhinorrhea, sneezing, neck pain, neck stiffness and tinnitus.  Respiratory: admits to SOB, DOE, cough,  PND, orthopnea   Cardiovascular: denies chest pain, palpitations,  Has had some  leg swelling.  Gastrointestinal: denies nausea, vomiting, abdominal pain, diarrhea, constipation, blood in stool.  Genitourinary: denies dysuria, urgency, frequency, hematuria, flank pain and difficulty urinating.  Musculoskeletal: denies  myalgias, back pain, joint swelling, arthralgias and gait problem.   Skin: denies pallor, rash and wound.  Neurological: denies dizziness, seizures, syncope, weakness, light-headedness, numbness and headaches.   Hematological: denies adenopathy, easy bruising, personal or family bleeding history.  Psychiatric/ Behavioral: denies suicidal ideation, mood changes, confusion, nervousness, sleep  disturbance and agitation.    Physical Exam: BP 132/71 mmHg  Pulse 131  Temp(Src) 98 F (36.7 C) (Oral)  Resp 26  Ht 5\' 1"  (1.549 m)  Wt 72.576 kg (160 lb)  BMI 30.25 kg/m2  SpO2 94%  Wt Readings from Last 3 Encounters:  04/10/15 72.576 kg (160 lb)  01/02/15 68.04 kg (150 lb)  12/30/13 75.841 kg (167 lb 3.2 oz)    General: Vital signs reviewed and  noted. Patient sitting bolt upright in bed.  BIPAP in place   Head: Normocephalic, atraumatic, sclera anicteric,   Neck: Supple. Negative for carotid bruits. No JVD   Lungs:  Bilateral consolidation , + E to A,  Rales in upper lung fields   Heart: RRR with S1 S2.  Tachycardic   Abdomen/ GI :  Soft, non-tender, non-distended with normoactive bowel sounds. No hepatomegaly. No rebound/guarding. No obvious abdominal masses   MSK: S/p right AKA   Extremities: S/p right AKA,  Trace edema in left leg  Neurologic:  CN are grossly intact,  No obvious motor or sensory defect.  Alert and oriented X 3. Moves all extremities spontaneously.  Psych: Nodded yes and no to questions    Lab results: Basic Metabolic Panel:  Recent Labs Lab 04/10/15 0850  NA 137  K 4.6  CL 102  CO2 20*  GLUCOSE 493*  BUN 26*  CREATININE 1.49*  CALCIUM 9.1    Liver Function Tests: No results for input(s): AST, ALT, ALKPHOS, BILITOT, PROT, ALBUMIN in the last 168 hours. No results for input(s): LIPASE, AMYLASE in the last 168 hours. No results for input(s): AMMONIA in the last 168 hours.  CBC:  Recent Labs Lab 04/10/15 0850  WBC 11.5*  HGB 10.9*  HCT 34.2*  MCV 79.7  PLT 381    Cardiac Enzymes: No results for input(s): CKTOTAL, CKMB, CKMBINDEX, TROPONINI in the last 168 hours.  BNP: Invalid input(s): POCBNP  CBG: No results for input(s): GLUCAP in the last 168 hours.  Coagulation Studies: No results for input(s): LABPROT, INR in the last 72 hours.   Other results:  Personal review of EKG shows :  Sinus tach at 134.  NS ST abn.     Imaging: Dg Chest Port 1 View  04/10/2015   CLINICAL DATA:  Shortness of breath for 3 days. Cough for 3 days. Former smoker.  EXAM: PORTABLE CHEST - 1 VIEW  COMPARISON:  05/10/2012  FINDINGS: The cardiac silhouette is obscured. Extremely diminished lung volumes with increased density at both lung bases and crowding/ accentuation of the bronchovascular markings in the perihilar and apical regions. No pneumothorax. No acute osseous abnormality is identified.  IMPRESSION: Low lung volumes with increased density in the lung bases, which may represent a combination of atelectasis or consolidation and pleural effusions. Dedicated two-view chest radiographs recommended when clinically able.   Electronically Signed   By: Logan Bores   On: 04/10/2015 09:16        Assessment & Plan:  1.  Dyspnea:  No clear etiology for respiratory distress at this time.  She has a metabolic acidosis.  We have a myoview from 2012 that shows a normal LV systolic function of [id number].  She has consolidation on exam and I'm concerned about a primary pulmonary issue. The CXR is very difficult to interpret but appears to show effusions and pulmonary edema  Will get an echo to evaluate her LV function .  She is on BIPAP.    She is still not able to lie flat.    Her d-dimer is elevated.  Currently not able to get a CT because  She cannot lie flat.  She may need a PCCM consult .   ECG does not suggest an ACS.  Continue to cycle enzymes . The very mild POC Troponin is not suggestive of an ACS at this time.    2. Diabetes Mellitus :  Glucose is ~500.  Further plans per primary team  Thayer Headings, Brooke Bonito., MD, [facility] 04/10/2015, 11:10 AM Office - [phone] Pager [phone]

## 2015-04-10 NOTE — ED Notes (Signed)
Pt Hr elevated to 144 and pt became air hungry, altered, and began ripping off mask.  Dr Tomi Bamberger notified.  NRB turned up to 15L.  Pt improved.  Now able to say words.  HR in 120's.

## 2015-04-10 NOTE — H&P (Signed)
History and Physical        Hospital Admission Note Date: 04/10/2015  Patient name: Jenna Brown Medical record number: [id number] Date of birth: 01/02/1951 Age: 64 y.o. Gender: female  PCP: [name], MD  Referring physician: Dr Tomi Bamberger  Chief Complaint:  Shortness of breath  HPI: Patient is a 64 year old female with hyperlipidemia, CVA, hypertension, type 2 diabetes mellitus, CAD, right AKA presented from home with acute shortness of breath. History was obtained from the patient who reported that she has been having worsening shortness of breath for last 3 days. She has noticed worsening orthopnea, PND, mostly sitting up with 4 pillows, has noticed weight gain of 6 lbs (unable to tell me the duration), swelling in her left leg. However this morning, patient had difficult time catching her breath and EMS was called. Per EMS, O2 sats were 85% on room air and she was placed on NRB mask. She denied any fevers, chills, productive cough or any wheezing. She did have discomfort in her left upper abdomen at the time of triage however denies any abdominal pain at this time. ER workup/EDP recommendations reviewed, patient was placed on BiPAP in ED. She was evaluated by cardiology in ED, Dr Acie Fredrickson recommended pulmonary critical care evaluation.  ER workup showed sodium 137, BUN 26, creatinine 1.49, glucose 493, BNP 395.3, troponin 0.15 WBCs 11.5, hemoglobin 10.9 Elevated d-dimer of 0.85  Review of Systems:  Constitutional: Denies fever, chills, diaphoresis, poor appetite and fatigue.  HEENT: Denies photophobia, eye pain, redness, hearing loss, ear pain, congestion, sore throat, rhinorrhea, sneezing, mouth sores, trouble swallowing, neck pain, neck stiffness and tinnitus.   Respiratory: Please see history of present illness Cardiovascular: Please see history of present  illness Gastrointestinal: Denies nausea, vomiting, + abdominal discomfort left lower quadrant, now improved, diarrhea, constipation, blood in stool and abdominal distention.  Genitourinary: Denies dysuria, urgency, frequency, hematuria, flank pain and difficulty urinating.  Musculoskeletal: Denies myalgias, back pain, joint swelling, arthralgias and gait problem.  right AKA Skin: Denies pallor, rash and wound.  Neurological: Denies dizziness, seizures, syncope, weakness, light-headedness, numbness and headaches.  Hematological: Denies adenopathy. Easy bruising, personal or family bleeding history  Psychiatric/Behavioral: Denies suicidal ideation, mood changes, confusion, nervousness, sleep disturbance and agitation  Past Medical History: Past Medical History  Diagnosis Date  . Hyperlipidemia     takes Simvastatin daily  . Peripheral vascular disease   . Stroke     TIA history  . PONV (postoperative nausea and vomiting)   . Hypertension     takes Amlodipine/HCTZ/Losartan daily  . Vertigo     takes Ativert prn  . Hemorrhoids   . Anxiety     takes Xanax prn  . Myocardial infarction 1990's  . Type II diabetes mellitus     takes Glucovance and Januvia daily  . H/O hiatal hernia   . GERD (gastroesophageal reflux disease)   . Headache(784.0)     when b/p is elevated  . Migraines   . Arthritis     Past Surgical History  Procedure Laterality Date  . Cleft palate repair      several  . Pr vein bypass graft,aorto-fem-pop  05/27/11  Right SFA-Below knee Pop BP  . Tonsillectomy    . Dilation and curettage of uterus    . Aortogram  02/2012  . Colonoscopy    . Femoral-tibial bypass graft  03/29/2012    Procedure: BYPASS GRAFT FEMORAL-TIBIAL ARTERY;  Surgeon: [name], MD;  Location: [facility] OR;  Service: Vascular;  Laterality: Right;  Right femoral to Posterior Tibialis with composite graft of 79mm x 80 cm ringed gortex graft and saphenous vein  ,intraoperative arteriogram  .  Femoral-popliteal bypass graft  04/05/2012    Procedure: BYPASS GRAFT FEMORAL-POPLITEAL ARTERY;  Surgeon: [name], MD;  Location: [facility];  Service: Vascular;  Laterality: Right;  REVISION  . Myomectomy    . Tubal ligation  ~ 1990  . Femoropopliteal thrombectomy / embolectomy  05/09/2012  . Amputation  05/10/2012    Procedure: AMPUTATION ABOVE KNEE;  Surgeon: [name], MD;  Location: [facility] OR;  Service: Vascular;  Laterality: Right;   open right groin wound noted  . Abdominal aortagram N/A 02/29/2012    Procedure: ABDOMINAL Maxcine Ham;  Surgeon: [name], MD;  Location: [facility] CATH LAB;  Service: Cardiovascular;  Laterality: N/A;    Medications: Prior to Admission medications   Medication Sig Start Date End Date Taking? Authorizing Provider  ALPRAZolam (XANAX) 0.25 MG tablet Take 0.25 mg by mouth 3 (three) times daily as needed for anxiety.    Yes Historical Provider, MD  amLODipine (NORVASC) 10 MG tablet Take 10 mg by mouth daily.  01/30/12  Yes Historical Provider, MD  aspirin 81 MG chewable tablet Chew 81 mg by mouth daily.   Yes Historical Provider, MD  glipiZIDE-metformin (METAGLIP) 5-500 MG per tablet Take 2 tablets by mouth 2 (two) times daily. 03/31/15  Yes Historical Provider, MD  hydrochlorothiazide (HYDRODIURIL) 25 MG tablet Take 25 mg by mouth daily.  02/09/12  Yes Historical Provider, MD  ibuprofen (ADVIL,MOTRIN) 200 MG tablet Take 400 mg by mouth every 6 (six) hours as needed for mild pain or moderate pain.   Yes Historical Provider, MD  LIVALO 4 MG TABS Take 4 mg by mouth daily. 03/04/15  Yes Historical Provider, MD  losartan (COZAAR) 100 MG tablet Take 100 mg by mouth daily.  01/30/12  Yes Historical Provider, MD  meclizine (ANTIVERT) 25 MG tablet Take 25 mg by mouth daily as needed for dizziness.    Yes Historical Provider, MD  vitamin C (ASCORBIC ACID) 500 MG tablet Take 500 mg by mouth daily.   Yes Historical Provider, MD  zinc sulfate 220 MG capsule Take 220 mg by mouth  daily.   Yes Historical Provider, MD  atorvastatin (LIPITOR) 20 MG tablet Take 1 tablet (20 mg total) by mouth daily at 6 PM. Patient not taking: Reported on 04/10/2015 05/14/12 04/09/16  Hulen Shouts Rhyne, PA-C    Allergies:   Allergies  Allergen Reactions  . Plavix [Clopidogrel Bisulfate] Itching  . Codeine Other (See Comments)    "makes me feel strange"  . Tylenol With Codeine #3 [Acetaminophen-Codeine] Other (See Comments)    "doesn't make me feel right"  . Tylenol [Acetaminophen] Other (See Comments)    "Doesn't feel right"     Social History:  reports that she quit smoking about 3 years ago. Her smoking use included Cigarettes. She has a 10 pack-year smoking history. She has never used smokeless tobacco. She reports that she does not drink alcohol or use illicit drugs. patient lives at home by herself  Family History: Family History  Problem Relation Age of Onset  . Cancer Mother     breast  . Diabetes Father     Physical Exam: Blood pressure 113/73, pulse 117, temperature 98 F (36.7 C), temperature source Oral, resp. rate 25, height 5\' 1"  (1.549 m), weight 72.576 kg (160 lb), SpO2 95 %. General: Alert, awake, oriented x3, on BiPAP HEENT: normocephalic, atraumatic, anicteric sclera, pink conjunctiva, pupils equal and reactive to light and accomodation, oropharynx clear Neck: supple, no masses or lymphadenopathy, no goiter, no bruits  Heart: Regular rate and rhythm, tachycardia without murmurs, rubs or gallops. Lungs: Bibasilar crackles Abdomen: Soft, nontender, nondistended, positive bowel sounds, no masses. Extremities: Right AKA, 1+ pitting edema left lower extremity Neuro: Grossly intact, no focal neurological deficits, strength 5/5 upper and lower extremities bilaterally Psych: alert and oriented x 3, normal mood and affect Skin: no rashes or lesions, warm and dry   LABS on Admission:  Basic Metabolic Panel:  Recent Labs Lab 04/10/15 0850  NA 137  K 4.6  CL  102  CO2 20*  GLUCOSE 493*  BUN 26*  CREATININE 1.49*  CALCIUM 9.1   Liver Function Tests: No results for input(s): AST, ALT, ALKPHOS, BILITOT, PROT, ALBUMIN in the last 168 hours. No results for input(s): LIPASE, AMYLASE in the last 168 hours. No results for input(s): AMMONIA in the last 168 hours. CBC:  Recent Labs Lab 04/10/15 0850  WBC 11.5*  HGB 10.9*  HCT 34.2*  MCV 79.7  PLT 381   Cardiac Enzymes: No results for input(s): CKTOTAL, CKMB, CKMBINDEX, TROPONINI in the last 168 hours. BNP: Invalid input(s): POCBNP CBG: No results for input(s): GLUCAP in the last 168 hours.  Radiological Exams on Admission:  Dg Chest Port 1 View  04/10/2015   CLINICAL DATA:  Shortness of breath for 3 days. Cough for 3 days. Former smoker.  EXAM: PORTABLE CHEST - 1 VIEW  COMPARISON:  05/10/2012  FINDINGS: The cardiac silhouette is obscured. Extremely diminished lung volumes with increased density at both lung bases and crowding/ accentuation of the bronchovascular markings in the perihilar and apical regions. No pneumothorax. No acute osseous abnormality is identified.  IMPRESSION: Low lung volumes with increased density in the lung bases, which may represent a combination of atelectasis or consolidation and pleural effusions. Dedicated two-view chest radiographs recommended when clinically able.   Electronically Signed   By: Logan Bores   On: 04/10/2015 09:16    *I have personally reviewed the images above*  EKG: Independently reviewed. Rate 134, sinus tachycardia, repolarization changes, QTC 473   Assessment/Plan Principal Problem:   Acute respiratory failure with hypoxia: Clinical exam including bibasilar crackles, lower extremity edema, elevated BNP and chest x-ray with pleural effusion suggesting CHF component. She does not have any fevers or symptoms suggesting acute infection. D-dimer is also slightly elevated however given her renal dysfunction, unable to do CT scan. ABG/BMET  suggesting metabolic acidosis with anion gap of 15 due to mild DKA versus lactic acidosis from metformin, glucose 493 at the time of admission - Admit to stepdown, continue BiPAP per RT, obtain serial cardiac enzymes, 2-D echocardiogram for further workup -  Troponin first set positive, unable to do CT angiogram of the chest to rule out PE, will attempt VQ scan, placed on IV heparin drip for now - Obtain strict I's and O's and daily weights, placed on IV Lasix, 2-D echocardiogram, Coreg. Holding amlodipine, holding ACE inhibitor secondary to renal insufficiency - Obtain pro-calcitonin, for now placed on IV Rocephin and  Zithromax although infectious processes less likely. If pro-calcitonin is normal, patient does not develop any fevers or worsening leukocytosis, antibiotics can be discontinued.  Active Problems:   Hypertension - Currently stable, continue Coreg, Lasix    Hyperlipidemia - Obtain lipid panel, continue statin    Peripheral vascular disease  - continue aspirin   metabolic acidosis with anion gap 15: ABG/BMET suggesting metabolic acidosis with anion gap of 15 due to mild DKA versus lactic acidosis from metformin, glucose 493 at the time of admission -  check lactic acid, place on sliding scale and insulin -  hemoglobin A1c    DMII (diabetes mellitus, type 2) - Placed on sliding scale insulin, obtain hemoglobin A1c   Sinus Tachycardia: Likely due to #1, also patient received albuterol treatments -  Placed on Coreg, obtain 2-D echo    Acute kidney injury: Unknown baseline, patient may have chronic kidney disease due to diabetic nephropathy  - For now hold metformin, HCTZ, ibuprofen, losartan  - Patient is on diuresis, monitor creatinine function closely   DVT prophylaxis:  heparin drip until PE ruled out   CODE STATUS:  DO NOT RESUSCITATE status discussed with the patient in detail   Family Communication: Admission, patients condition and plan of care including tests being  ordered have been discussed with the patient who indicates understanding and agree with the plan and Code Status  Disposition plan: Further plan will depend as patient's clinical course evolves and further radiologic and laboratory data become available.   Time Spent on Admission: 60 minutes   Yamato Kopf M.D. [facility] 04/10/2015, 12:01 PM Pager: [phone]  If 7PM-7AM, please contact night-coverage [email or web address] Password [email or web address]

## 2015-04-10 NOTE — Progress Notes (Signed)
Utilization review completed. Jenita Rayfield, RN, BSN. 

## 2015-04-10 NOTE — Consult Note (Signed)
PULMONARY / CRITICAL CARE MEDICINE   Name: Jenna Brown MRN: [id number] DOB: 1951-07-20    ADMISSION DATE:  04/10/2015 CONSULTATION DATE:  04/10/2015  REFERRING MD :  EDP  CHIEF COMPLAINT:  Acute respiratory failure  INITIAL PRESENTATION: 64 year old female with PMH of CHF who presents to the hospital with SOB.  Noted to have acute hypoxemia and metabolic acidosis.  Patient was started on BiPAP for WOB and PCCM was called on consultation.  No evidence of infection.  Positive for orthopnea and PND.  STUDIES:  CXR 7/22>>>Pulmonary edema and bibasilar atelectasis.  SIGNIFICANT EVENTS: 7/22 admission to the hospital.  HISTORY OF PRESENT ILLNESS:  64 year old female with PMH of CHF who presents to the hospital with SOB.  Noted to have acute hypoxemia and metabolic acidosis.  Patient was started on BiPAP for WOB and PCCM was called on consultation.  No evidence of infection.  Positive for orthopnea and PND.  PAST MEDICAL HISTORY :   has a past medical history of Hyperlipidemia; Peripheral vascular disease; Stroke; PONV (postoperative nausea and vomiting); Hypertension; Vertigo; Hemorrhoids; Anxiety; Myocardial infarction (1990's); Type II diabetes mellitus; H/O hiatal hernia; GERD (gastroesophageal reflux disease); Headache(784.0); Migraines; and Arthritis.  has past surgical history that includes Cleft palate repair; vein bypass graft,aorto-fem-pop (05/27/11); Tonsillectomy; Dilation and curettage of uterus; aortogram (02/2012); Colonoscopy; Femoral-tibial Bypass Graft (03/29/2012); Femoral-popliteal Bypass Graft (04/05/2012); Myomectomy; Tubal ligation (~ 1990); Femoropopliteal thrombectomy / embolectomy (05/09/2012); Amputation (05/10/2012); and abdominal aortagram (N/A, 02/29/2012). Prior to Admission medications   Medication Sig Start Date End Date Taking? Authorizing Provider  ALPRAZolam (XANAX) 0.25 MG tablet Take 0.25 mg by mouth 3 (three) times daily as needed for anxiety.    Yes Historical  Provider, MD  amLODipine (NORVASC) 10 MG tablet Take 10 mg by mouth daily.  01/30/12  Yes Historical Provider, MD  aspirin 81 MG chewable tablet Chew 81 mg by mouth daily.   Yes Historical Provider, MD  glipiZIDE-metformin (METAGLIP) 5-500 MG per tablet Take 2 tablets by mouth 2 (two) times daily. 03/31/15  Yes Historical Provider, MD  hydrochlorothiazide (HYDRODIURIL) 25 MG tablet Take 25 mg by mouth daily.  02/09/12  Yes Historical Provider, MD  ibuprofen (ADVIL,MOTRIN) 200 MG tablet Take 400 mg by mouth every 6 (six) hours as needed for mild pain or moderate pain.   Yes Historical Provider, MD  LIVALO 4 MG TABS Take 4 mg by mouth daily. 03/04/15  Yes Historical Provider, MD  losartan (COZAAR) 100 MG tablet Take 100 mg by mouth daily.  01/30/12  Yes Historical Provider, MD  meclizine (ANTIVERT) 25 MG tablet Take 25 mg by mouth daily as needed for dizziness.    Yes Historical Provider, MD  vitamin C (ASCORBIC ACID) 500 MG tablet Take 500 mg by mouth daily.   Yes Historical Provider, MD  zinc sulfate 220 MG capsule Take 220 mg by mouth daily.   Yes Historical Provider, MD  atorvastatin (LIPITOR) 20 MG tablet Take 1 tablet (20 mg total) by mouth daily at 6 PM. Patient not taking: Reported on 04/10/2015 05/14/12 04/09/16  Hulen Shouts Rhyne, PA-C   Allergies  Allergen Reactions  . Plavix [Clopidogrel Bisulfate] Itching  . Codeine Other (See Comments)    "makes me feel strange"  . Tylenol With Codeine #3 [Acetaminophen-Codeine] Other (See Comments)    "doesn't make me feel right"  . Tylenol [Acetaminophen] Other (See Comments)    "Doesn't feel right"     FAMILY HISTORY:  indicated that her mother  is deceased. She indicated that her father is deceased.  SOCIAL HISTORY:  reports that she quit smoking about 3 years ago. Her smoking use included Cigarettes. She has a 10 pack-year smoking history. She has never used smokeless tobacco. She reports that she does not drink alcohol or use illicit  drugs.  REVIEW OF SYSTEMS:  Unattainable, patient is on BiPAP and unable to speak clearly.  SUBJECTIVE: SOB and orthopnea  VITAL SIGNS: Temp:  [98 F (36.7 C)] 98 F (36.7 C) (07/22 0848) Pulse Rate:  [98-137] 111 (07/22 1245) Resp:  [18-44] 23 (07/22 1245) BP: (103-154)/(63-86) 103/74 mmHg (07/22 1245) SpO2:  [85 %-98 %] 98 % (07/22 1245) FiO2 (%):  [50 %] 50 % (07/22 1112) Weight:  [72.576 kg (160 lb)] 72.576 kg (160 lb) (07/22 0843) HEMODYNAMICS:   VENTILATOR SETTINGS: Vent Mode:  [-]  FiO2 (%):  [50 %] 50 % INTAKE / OUTPUT:  Intake/Output Summary (Last 24 hours) at 04/10/15 1324 Last data filed at 04/10/15 1255  Gross per 24 hour  Intake      0 ml  Output    500 ml  Net   -500 ml    PHYSICAL EXAMINATION: General:  Chronically ill appearing female.  Moderate respiratory distress. Neuro:  Alert and oriented but unable to verbalized clearly due to BiPAP mask.  Moving all ext to command. HEENT:  [place]/AT, PERRL, EOM-I and MMM. Cardiovascular:  RRR, Nl S1/S2, -M/R/G. Lungs:  Diffuse crackles, bibasilar atelectasis. Abdomen:  Soft, NT, ND and +BS. Musculoskeletal:  -edema and -tenderness on left leg. Skin:  Intact.  LABS:  CBC  Recent Labs Lab 04/10/15 0850  WBC 11.5*  HGB 10.9*  HCT 34.2*  PLT 381   Coag's No results for input(s): APTT, INR in the last 168 hours. BMET  Recent Labs Lab 04/10/15 0850  NA 137  K 4.6  CL 102  CO2 20*  BUN 26*  CREATININE 1.49*  GLUCOSE 493*   Electrolytes  Recent Labs Lab 04/10/15 0850  CALCIUM 9.1   Sepsis Markers  Recent Labs Lab 04/10/15 1227 04/10/15 1254  LATICACIDVEN  --  1.96  PROCALCITON 0.13  --    ABG  Recent Labs Lab 04/10/15 0947  PHART 7.292*  PCO2ART 37.5  PO2ART 78.0*   Liver Enzymes No results for input(s): AST, ALT, ALKPHOS, BILITOT, ALBUMIN in the last 168 hours. Cardiac Enzymes  Recent Labs Lab 04/10/15 1227  TROPONINI 0.30*   Glucose No results for input(s): GLUCAP in  the last 168 hours.  Imaging Dg Chest Port 1 View  04/10/2015   CLINICAL DATA:  Shortness of breath for 3 days. Cough for 3 days. Former smoker.  EXAM: PORTABLE CHEST - 1 VIEW  COMPARISON:  05/10/2012  FINDINGS: The cardiac silhouette is obscured. Extremely diminished lung volumes with increased density at both lung bases and crowding/ accentuation of the bronchovascular markings in the perihilar and apical regions. No pneumothorax. No acute osseous abnormality is identified.  IMPRESSION: Low lung volumes with increased density in the lung bases, which may represent a combination of atelectasis or consolidation and pleural effusions. Dedicated two-view chest radiographs recommended when clinically able.   Electronically Signed   By: Logan Bores   On: 04/10/2015 09:16     ASSESSMENT / PLAN:  PULMONARY OETT None A: Acute hypoxemic respiratory failure due to pulmonary edema. ?PE but unlikely. D-dimer 0.85. P:   - BiPAP for comfort. - Titrate O2 for sat of 88-92%. - No need for abx. -  Heparin drip. - Would need VQ scan if able to lay flat but for now being anti-coag for cardiac reasons anyway.  CARDIOVASCULAR PIV A: Positive troponin. CHF history. P:  - Diureses as renal function allows. - Cards following. - NTG. - Heparin drip per cards. - 2D echo ordered.  RENAL A:  ARF, unknown cause. Metabolic acidosis. P:   - Diureses as renal function allows. - Replace electrolytes as indicated. - BMET in AM.  GASTROINTESTINAL A:  No active issues. P:   - Monitor. - NPO while on BiPAP.  HEMATOLOGIC A:  Fully anti-coag. P:  - Full dose heparin. - Monitor for bleeding. - Transfuse per ICU protocol.  INFECTIOUS A:  No signs of active infection. P:   - Monitor off abx.  ENDOCRINE A:  DM.   P:   - ISS. - CBGs.  NEUROLOGIC A:  No active issues. P:   - Minimize sedation.  The patient is critically ill with multiple organ systems failure and requires high complexity  decision making for assessment and support, frequent evaluation and titration of therapies, application of advanced monitoring technologies and extensive interpretation of multiple databases.   Critical Care Time devoted to patient care services described in this note is  35  Minutes. This time reflects time of care of this signee Dr Jennet Maduro. This critical care time does not reflect procedure time, or teaching time or supervisory time of PA/NP/Med student/Med Resident etc but could involve care discussion time.  Rush Farmer, M.D. [facility] Pulmonary/Critical Care Medicine. Pager: [phone]. After hours pager: [phone].  04/10/2015, 1:24 PM

## 2015-04-10 NOTE — Progress Notes (Signed)
RT transported PT from ED to [place]. No complications. Vital signs stable throughout. Patient tolerated well. RN at bedside. RT will continue to monitor.

## 2015-04-11 ENCOUNTER — Inpatient Hospital Stay (HOSPITAL_COMMUNITY): Payer: Medicare Other

## 2015-04-11 DIAGNOSIS — I214 Non-ST elevation (NSTEMI) myocardial infarction: Secondary | ICD-10-CM

## 2015-04-11 DIAGNOSIS — I10 Essential (primary) hypertension: Secondary | ICD-10-CM | POA: Diagnosis present

## 2015-04-11 DIAGNOSIS — R609 Edema, unspecified: Secondary | ICD-10-CM

## 2015-04-11 DIAGNOSIS — I313 Pericardial effusion (noninflammatory): Secondary | ICD-10-CM

## 2015-04-11 DIAGNOSIS — I739 Peripheral vascular disease, unspecified: Secondary | ICD-10-CM

## 2015-04-11 DIAGNOSIS — R7989 Other specified abnormal findings of blood chemistry: Secondary | ICD-10-CM | POA: Diagnosis present

## 2015-04-11 DIAGNOSIS — J9601 Acute respiratory failure with hypoxia: Secondary | ICD-10-CM | POA: Diagnosis present

## 2015-04-11 DIAGNOSIS — E1165 Type 2 diabetes mellitus with hyperglycemia: Secondary | ICD-10-CM

## 2015-04-11 DIAGNOSIS — E872 Acidosis, unspecified: Secondary | ICD-10-CM | POA: Diagnosis present

## 2015-04-11 DIAGNOSIS — J9811 Atelectasis: Secondary | ICD-10-CM

## 2015-04-11 DIAGNOSIS — I5021 Acute systolic (congestive) heart failure: Secondary | ICD-10-CM | POA: Diagnosis present

## 2015-04-11 DIAGNOSIS — E785 Hyperlipidemia, unspecified: Secondary | ICD-10-CM | POA: Diagnosis present

## 2015-04-11 DIAGNOSIS — R791 Abnormal coagulation profile: Secondary | ICD-10-CM

## 2015-04-11 DIAGNOSIS — Z794 Long term (current) use of insulin: Secondary | ICD-10-CM

## 2015-04-11 DIAGNOSIS — N179 Acute kidney failure, unspecified: Secondary | ICD-10-CM | POA: Diagnosis present

## 2015-04-11 DIAGNOSIS — IMO0001 Reserved for inherently not codable concepts without codable children: Secondary | ICD-10-CM | POA: Diagnosis present

## 2015-04-11 LAB — BASIC METABOLIC PANEL
Anion gap: 10 (ref 5–15)
BUN: 30 mg/dL — ABNORMAL HIGH (ref 6–20)
CO2: 22 mmol/L (ref 22–32)
Calcium: 8.8 mg/dL — ABNORMAL LOW (ref 8.9–10.3)
Chloride: 106 mmol/L (ref 101–111)
Creatinine, Ser: 1.42 mg/dL — ABNORMAL HIGH (ref 0.44–1.00)
GFR calc non Af Amer: 39 mL/min — ABNORMAL LOW (ref >60)
GFR, EST AFRICAN AMERICAN: 45 mL/min — AB (ref >60)
Glucose, Bld: 213 mg/dL — ABNORMAL HIGH (ref 65–99)
POTASSIUM: 4.1 mmol/L (ref 3.5–5.1)
SODIUM: 138 mmol/L (ref 135–145)

## 2015-04-11 LAB — HEPARIN LEVEL (UNFRACTIONATED)
HEPARIN UNFRACTIONATED: 0.28 [IU]/mL — AB (ref 0.30–0.70)
Heparin Unfractionated: 0.37 IU/mL (ref 0.30–0.70)

## 2015-04-11 LAB — TROPONIN I: Troponin I: 0.67 ng/mL (ref <0.031)

## 2015-04-11 LAB — HEMOGLOBIN A1C
HEMOGLOBIN A1C: 11.3 % — AB (ref 4.8–5.6)
MEAN PLASMA GLUCOSE: 278 mg/dL

## 2015-04-11 LAB — URINE CULTURE: Culture: NO GROWTH

## 2015-04-11 LAB — GLUCOSE, CAPILLARY
Glucose-Capillary: 216 mg/dL — ABNORMAL HIGH (ref 65–99)
Glucose-Capillary: 269 mg/dL — ABNORMAL HIGH (ref 65–99)
Glucose-Capillary: 355 mg/dL — ABNORMAL HIGH (ref 65–99)
Glucose-Capillary: 98 mg/dL (ref 65–99)

## 2015-04-11 LAB — CBC
HCT: 30.9 % — ABNORMAL LOW (ref 36.0–46.0)
Hemoglobin: 9.8 g/dL — ABNORMAL LOW (ref 12.0–15.0)
MCH: 25.3 pg — AB (ref 26.0–34.0)
MCHC: 31.7 g/dL (ref 30.0–36.0)
MCV: 79.6 fL (ref 78.0–100.0)
Platelets: 339 10*3/uL (ref 150–400)
RBC: 3.88 MIL/uL (ref 3.87–5.11)
RDW: 13.5 % (ref 11.5–15.5)
WBC: 9.4 10*3/uL (ref 4.0–10.5)

## 2015-04-11 MED ORDER — CETYLPYRIDINIUM CHLORIDE 0.05 % MT LIQD
7.0000 mL | Freq: Two times a day (BID) | OROMUCOSAL | Status: DC
  Administered 2015-04-11 – 2015-04-13 (×5): 7 mL via OROMUCOSAL

## 2015-04-11 MED ORDER — CHLORHEXIDINE GLUCONATE 0.12 % MT SOLN
15.0000 mL | Freq: Two times a day (BID) | OROMUCOSAL | Status: DC
  Administered 2015-04-11 – 2015-04-14 (×7): 15 mL via OROMUCOSAL
  Filled 2015-04-11 (×8): qty 15

## 2015-04-11 MED ORDER — LEVALBUTEROL HCL 1.25 MG/0.5ML IN NEBU
1.2500 mg | INHALATION_SOLUTION | Freq: Three times a day (TID) | RESPIRATORY_TRACT | Status: DC
  Administered 2015-04-11: 1.25 mg via RESPIRATORY_TRACT
  Filled 2015-04-11 (×2): qty 0.5

## 2015-04-11 MED ORDER — LEVALBUTEROL HCL 1.25 MG/0.5ML IN NEBU
1.2500 mg | INHALATION_SOLUTION | Freq: Three times a day (TID) | RESPIRATORY_TRACT | Status: DC
  Administered 2015-04-12 – 2015-04-21 (×28): 1.25 mg via RESPIRATORY_TRACT
  Filled 2015-04-11 (×33): qty 0.5

## 2015-04-11 MED ORDER — FUROSEMIDE 10 MG/ML IJ SOLN
60.0000 mg | Freq: Two times a day (BID) | INTRAMUSCULAR | Status: DC
  Administered 2015-04-12 (×2): 60 mg via INTRAVENOUS
  Filled 2015-04-11 (×3): qty 6

## 2015-04-11 MED ORDER — INSULIN GLARGINE 100 UNIT/ML ~~LOC~~ SOLN
12.0000 [IU] | Freq: Every day | SUBCUTANEOUS | Status: DC
  Administered 2015-04-11: 12 [IU] via SUBCUTANEOUS
  Filled 2015-04-11 (×2): qty 0.12

## 2015-04-11 MED ORDER — SODIUM CHLORIDE 0.9 % IV BOLUS (SEPSIS)
250.0000 mL | Freq: Once | INTRAVENOUS | Status: AC
  Administered 2015-04-11: 250 mL via INTRAVENOUS

## 2015-04-11 MED ORDER — PERFLUTREN LIPID MICROSPHERE
INTRAVENOUS | Status: AC
  Filled 2015-04-11: qty 10

## 2015-04-11 MED ORDER — PERFLUTREN LIPID MICROSPHERE
1.0000 mL | INTRAVENOUS | Status: AC | PRN
  Filled 2015-04-11: qty 10

## 2015-04-11 NOTE — Progress Notes (Signed)
Pt off of Bipap and placed on non rebreather at 15 liters for 3 hrs and tolerated well until this time. Pt currently desatting into o2 sats high 89s and 90s with RR 27-31. BiPap reapplied. Pt tolerating well. Will continue to monitor patient's condition.

## 2015-04-11 NOTE — Progress Notes (Signed)
Pt currently off bi-pap and on a 5 LPM [place]. Pt sat is 95  And RR 27. Pt is tolerating well. RT will continue to monitor pt.

## 2015-04-11 NOTE — Progress Notes (Signed)
Pt with decreased urinary output in [place]. 90cc since 1900 on 7/22. NP on call made aware. Will continue to monitor pt and implement MD orders if follow.

## 2015-04-11 NOTE — Progress Notes (Signed)
*  PRELIMINARY RESULTS* Vascular Ultrasound Left lower extremity venous duplex has been completed.  Preliminary findings: negative for DVT  Landry Mellow, RDMS, RVT  04/11/2015, 10:02 AM

## 2015-04-11 NOTE — Progress Notes (Signed)
ANTICOAGULATION CONSULT NOTE - Follow Up Consult  Pharmacy Consult for Heparin  Indication: chest pain/ACS  Allergies  Allergen Reactions  . Plavix [Clopidogrel Bisulfate] Itching  . Codeine Other (See Comments)    "makes me feel strange"  . Tylenol With Codeine #3 [Acetaminophen-Codeine] Other (See Comments)    "doesn't make me feel right"  . Tylenol [Acetaminophen] Other (See Comments)    "Doesn't feel right"    Patient Measurements: Height: 5\' 1"  (154.9 cm) Weight: 160 lb (72.576 kg) IBW/kg (Calculated) : 47.8  Vital Signs: Temp: 96.9 F (36.1 C) (07/23 0020) Temp Source: Axillary (07/23 0020) BP: 118/69 mmHg (07/23 0200) Pulse Rate: 104 (07/23 0200)  Labs:  Recent Labs  04/10/15 0850 04/10/15 1227 04/10/15 1824 04/10/15 2346 04/11/15 0100  HGB 10.9*  --  10.7*  --   --   HCT 34.2*  --  33.4*  --   --   PLT 381  --  393  --   --   HEPARINUNFRC  --   --  0.37  --  0.28*  CREATININE 1.49*  --  1.51*  --   --   TROPONINI  --  0.30* 0.50* 0.67*  --     Estimated Creatinine Clearance: 35.6 mL/min (by C-G formula based on Cr of 1.51).   Assessment: Slightly sub-therapeutic heparin level, no issues per RN.   Goal of Therapy:  Heparin level 0.3-0.7 units/ml Monitor platelets by anticoagulation protocol: Yes   Plan:  -Increase heparin to 1050 units/hr -0900 HL -Daily CBC/HL -Monitor for bleeding  [place], [place] 04/11/2015,2:11 AM

## 2015-04-11 NOTE — Progress Notes (Signed)
ANTICOAGULATION CONSULT NOTE - [place] for Heparin Indication: chest pain/ACS  Allergies  Allergen Reactions  . Plavix [Clopidogrel Bisulfate] Itching  . Codeine Other (See Comments)    "makes me feel strange"  . Tylenol With Codeine #3 [Acetaminophen-Codeine] Other (See Comments)    "doesn't make me feel right"  . Tylenol [Acetaminophen] Other (See Comments)    "Doesn't feel right"     Patient Measurements: Height: 5\' 1"  (154.9 cm) Weight: 167 lb 8.8 oz (76 kg) IBW/kg (Calculated) : 47.8 Heparin Dosing Weight: 73 kg   Vital Signs: Temp: 98.2 F (36.8 C) (07/23 1236) Temp Source: Oral (07/23 1236) BP: 107/63 mmHg (07/23 1236) Pulse Rate: 105 (07/23 1236)  Labs:  Recent Labs  04/10/15 0850 04/10/15 1227 04/10/15 1824 04/10/15 2346 04/11/15 0100 04/11/15 0230 04/11/15 0900  HGB 10.9*  --  10.7*  --   --  9.8*  --   HCT 34.2*  --  33.4*  --   --  30.9*  --   PLT 381  --  393  --   --  339  --   HEPARINUNFRC  --   --  0.37  --  0.28*  --  0.37  CREATININE 1.49*  --  1.51*  --   --  1.42*  --   TROPONINI  --  0.30* 0.50* 0.67*  --   --   --     Estimated Creatinine Clearance: 38.8 mL/min (by C-G formula based on Cr of 1.42).   Medical History: Past Medical History  Diagnosis Date  . Hyperlipidemia     takes Simvastatin daily  . Peripheral vascular disease   . Stroke     TIA history  . PONV (postoperative nausea and vomiting)   . Hypertension     takes Amlodipine/HCTZ/Losartan daily  . Vertigo     takes Ativert prn  . Hemorrhoids   . Anxiety     takes Xanax prn  . Myocardial infarction 1990's  . Type II diabetes mellitus     takes Glucovance and Januvia daily  . H/O hiatal hernia   . GERD (gastroesophageal reflux disease)   . Headache(784.0)     when b/p is elevated  . Migraines   . Arthritis     Medications:  Prescriptions prior to admission  Medication Sig Dispense Refill Last Dose  . ALPRAZolam (XANAX) 0.25 MG  tablet Take 0.25 mg by mouth 3 (three) times daily as needed for anxiety.    Past Week at Unknown time  . amLODipine (NORVASC) 10 MG tablet Take 10 mg by mouth daily.    04/09/2015 at Unknown time  . aspirin 81 MG chewable tablet Chew 81 mg by mouth daily.   04/09/2015 at Unknown time  . glipiZIDE-metformin (METAGLIP) 5-500 MG per tablet Take 2 tablets by mouth 2 (two) times daily.  0 04/09/2015 at Unknown time  . hydrochlorothiazide (HYDRODIURIL) 25 MG tablet Take 25 mg by mouth daily.    04/09/2015 at Unknown time  . ibuprofen (ADVIL,MOTRIN) 200 MG tablet Take 400 mg by mouth every 6 (six) hours as needed for mild pain or moderate pain.   04/09/2015 at Unknown time  . LIVALO 4 MG TABS Take 4 mg by mouth daily.  0 04/09/2015 at Unknown time  . losartan (COZAAR) 100 MG tablet Take 100 mg by mouth daily.    04/09/2015 at Unknown time  . meclizine (ANTIVERT) 25 MG tablet Take 25 mg by mouth daily as needed for  dizziness.    Past Week at Unknown time  . vitamin C (ASCORBIC ACID) 500 MG tablet Take 500 mg by mouth daily.   04/09/2015 at Unknown time  . zinc sulfate 220 MG capsule Take 220 mg by mouth daily.   04/09/2015 at Unknown time  . atorvastatin (LIPITOR) 20 MG tablet Take 1 tablet (20 mg total) by mouth daily at 6 PM. (Patient not taking: Reported on 04/10/2015) 30 tablet 2 Not Taking at Unknown time    Assessment: 64yo female presented to the ED with SOB that started 3 days ago. Her troponin is mildly elevated, DDimer slightly elevated 0.85. Pharmacy consulted to start IV heparin for ACS, r/o PE - plan VQ or CT when able to lie flat.  H/H mildly low but stable, PLT WNL, no bleeding noted. Patient not on any anticoagulants prior to admission. Heparin drip 1050 uts/hr HL 0.37  Goal of Therapy:  Heparin level 0.3-0.7 units/ml Monitor platelets by anticoagulation protocol: Yes   Plan:  -Continue heparin drip 1050 units/hr -Monitor daily CBC, HL, and s/sx of bleeding   Bonnita Nasuti Pharm.D. CPP,  BCPS Clinical Pharmacist [phone] 04/11/2015 12:54 PM

## 2015-04-11 NOTE — Progress Notes (Signed)
[place] TEAM 1 - Stepdown/ICU TEAM Progress Note  Jenna Brown [id number] DOB: 06-12-1951 DOA: 04/10/2015 PCP: [name], MD  Admit HPI / Brief Narrative: 63-year-[name], HLD, HTN, CAD Diabetes Type 2 uncontrolled, CVA, Rt AKA   Presented from home with acute shortness of breath. History was obtained from the patient who reported that she has been having worsening shortness of breath for last 3 days. She has noticed worsening orthopnea, PND, mostly sitting up with 4 pillows, has noticed weight gain of 6 lbs (unable to tell me the duration), swelling in her left leg. However this morning, patient had difficult time catching her breath and EMS was called. Per EMS, O2 sats were 85% on room air and she was placed on NRB mask. She denied any fevers, chills, productive cough or any wheezing. She did have discomfort in her left upper abdomen at the time of triage however denies any abdominal pain at this time. ER workup/EDP recommendations reviewed, patient was placed on BiPAP in ED. She was evaluated by cardiology in ED, Dr Acie Fredrickson recommended pulmonary critical care evaluation.  ER workup showed sodium 137, BUN 26, creatinine 1.49, glucose 493, BNP 395.3, troponin 0.15 WBCs 11.5, hemoglobin 10.9 Elevated d-dimer of 0.85   HPI/Subjective: 7/23 A/O 4, positive SOB, negative CP, negative N/V  Assessment/Plan: Acute respiratory failure with hypoxia:  -multifactorial to include fluid overload, NSTEMI, and HCAP? -Increase Lasix to 60 mg BID -Strict in and out since admission -727 ml -Daily a.m. Weight -Continue current antibiotics regimen for now -Xopenex nebulizer TID -Titrate O2 to maintain SPO2 89-93% -BiPAP PRN  Elevated d-dimer  -Most likely secondary to her renal dysfunction and NSTEMI, unfortunately cannot R/O PE  -patient currently unable to have CT angiogram or VQ scan.  -Patient currently on heparin drip secondary to NSTEMI therefore would not change  treatment plan. However once more stable would attempt to obtain VQ scan, would need to be on long-term anticoagulation.  NSTEMI  -Echocardiogram pending -Lexiscan myoview per cardiology  Acute Systolic CHF   -See acute respiratory failure -Continue Coreg 3.125 mg BID   Hypertension -Patient currently borderline hypotensive -See acute systolic CHF  Peripheral vascular disease  - continue aspirin 81 mg daily  Hyperlipidemia - Obtain lipid panel -Continue pravastatin 80 mg daily; consider changing to Crestor  Metabolic acidosis with anion gap 15: -Resolved  DM Type II (diabetes mellitus, type 2) uncontrolled - 7/22 hemoglobin A1c= 11.3 -Lipid panel ordered -Increase Lantus to 12 units daily -Continue moderate SSI  Acute renal failure ( Unknown baseline)  -Hold all nephrotoxic medication -Hold metformin, HCTZ, ibuprofen, losartan  - Patient is on diuresis, monitor creatinine function closely    Code Status: FULL Family Communication: family present at time of exam Disposition Plan:  Per cardiology    Consultants: Dr. Wonda Cheng Nahser (cardiology) Dr.Wesam Kathryne Sharper [facility] M)   Procedure/Significant Events: 7/23 echocardiogram;- LVEF=20%. The entire mid-ventricle and apex are akinetic.(could be a Takotsubo ventricle).   Culture 7/22 urine NGTD 7/22 blood left forearm/antecubital NGTD   Antibiotics:   DVT prophylaxis: Heparin drip   Devices    LINES / TUBES:      Continuous Infusions: . heparin 1,050 Units/hr (04/11/15 0601)    Objective: VITAL SIGNS: Temp: 98 F (36.7 C) (07/23 1953) Temp Source: Oral (07/23 1953) BP: 129/74 mmHg (07/23 1815) Pulse Rate: 98 (07/23 1953) SPO2; FIO2:   Intake/Output Summary (Last 24 hours) at 04/11/15 2046 Last data filed at 04/11/15 1700  Gross per  24 hour  Intake 1053.45 ml  Output   1050 ml  Net   3.45 ml     Exam: General: A/O 4, positive SOB, No acute respiratory distress Eyes: Negative  headache, eye pain, double vision, negative scleral hemorrhage ENT: Negative Runny nose, negative ear pain, negative tinnitus, negative gingival bleeding Neck:  Negative scars, masses, torticollis, lymphadenopathy, JVD Lungs: Clear to auscultation bilateral upper lobes, poor to absent breath sounds remainder of lung fields bilaterally. Positive mild wheezes, negative crackles Cardiovascular: Tachycardic, Regular rhythm without murmur gallop or rub normal S1 and S2 Abdomen:negative abdominal pain, negative dysphagia, Nontender, nondistended, soft, bowel sounds positive, no rebound, no ascites, no appreciable mass Extremities: No significant cyanosis, clubbing, or edema left lower extremities, right AKA negative ulcers/lesions Psychiatric:  Negative depression, negative anxiety, negative fatigue, negative mania  Neurologic:  Cranial nerves II through XII intact, tongue/uvula midline, all extremities muscle strength 5/5, sensation intact throughout,dysarthria, negative expressive aphasia, negative receptive aphasia.     Data Reviewed: Basic Metabolic Panel:  Recent Labs Lab 04/10/15 0850 04/10/15 1824 04/11/15 0230  NA 137  --  138  K 4.6  --  4.1  CL 102  --  106  CO2 20*  --  22  GLUCOSE 493* 241* 213*  BUN 26*  --  30*  CREATININE 1.49* 1.51* 1.42*  CALCIUM 9.1  --  8.8*   Liver Function Tests: No results for input(s): AST, ALT, ALKPHOS, BILITOT, PROT, ALBUMIN in the last 168 hours. No results for input(s): LIPASE, AMYLASE in the last 168 hours. No results for input(s): AMMONIA in the last 168 hours. CBC:  Recent Labs Lab 04/10/15 0850 04/10/15 1824 04/11/15 0230  WBC 11.5* 10.3 9.4  HGB 10.9* 10.7* 9.8*  HCT 34.2* 33.4* 30.9*  MCV 79.7 79.5 79.6  PLT 381 393 339   Cardiac Enzymes:  Recent Labs Lab 04/10/15 1227 04/10/15 1824 04/10/15 2346  TROPONINI 0.30* 0.50* 0.67*   BNP (last 3 results)  Recent Labs  04/10/15 0850  BNP 395.3*    ProBNP (last 3  results) No results for input(s): PROBNP in the last 8760 hours.  CBG:  Recent Labs Lab 04/10/15 1757 04/10/15 2139 04/11/15 0816 04/11/15 1214 04/11/15 1527  GLUCAP 307* 133* 216* 269* 355*    Recent Results (from the past 240 hour(s))  Urine culture     Status: None   Collection Time: 04/10/15 12:48 PM  Result Value Ref Range Status   Specimen Description URINE, RANDOM  Final   Special Requests NONE  Final   Culture NO GROWTH 1 DAY  Final   Report Status 04/11/2015 FINAL  Final  Culture, blood (routine x 2)     Status: None (Preliminary result)   Collection Time: 04/10/15  3:15 PM  Result Value Ref Range Status   Specimen Description BLOOD LEFT FOREARM  Final   Special Requests BOTTLES DRAWN AEROBIC ONLY 5CC  Final   Culture NO GROWTH < 24 HOURS  Final   Report Status PENDING  Incomplete  Culture, blood (routine x 2)     Status: None (Preliminary result)   Collection Time: 04/10/15  3:25 PM  Result Value Ref Range Status   Specimen Description BLOOD LEFT ANTECUBITAL  Final   Special Requests BOTTLES DRAWN AEROBIC AND ANAEROBIC 5CC 5CC  Final   Culture NO GROWTH < 24 HOURS  Final   Report Status PENDING  Incomplete     Studies:  Recent x-ray studies have been reviewed in detail by the  Attending Physician  Scheduled Meds:  Scheduled Meds: . antiseptic oral rinse  7 mL Mouth Rinse q12n4p  . aspirin  81 mg Oral Daily  . azithromycin  500 mg Intravenous Q24H  . carvedilol  3.125 mg Oral BID WC  . cefTRIAXone (ROCEPHIN)  IV  1 g Intravenous Q24H  . chlorhexidine  15 mL Mouth Rinse BID  . furosemide  60 mg Intravenous BID  . insulin aspart  0-15 Units Subcutaneous TID WC  . insulin aspart  0-5 Units Subcutaneous QHS  . insulin aspart  3 Units Subcutaneous TID WC  . insulin glargine  12 Units Subcutaneous QHS  . levalbuterol  1.25 mg Nebulization 3 times per day  . nitroGLYCERIN  1 inch Topical 4 times per day  . pravastatin  80 mg Oral q1800  . sodium chloride   3 mL Intravenous Q12H  . vitamin C  500 mg Oral Daily  . zinc sulfate  220 mg Oral Daily    Time spent on care of this patient: 40 mins   Bowie Doiron, Geraldo Docker , MD  [facility] Office  [phone] Pager [phone]  On-Call/Text Page:      [email or web address]      password [email or web address]  If 7PM-7AM, please contact night-coverage [email or web address] Password [facility] 04/11/2015, 8:46 PM   LOS: 1 day   Care during the described time interval was provided by me .  I have reviewed this patient's available data, including medical history, events of note, physical examination, and all test results as part of my evaluation. I have personally reviewed and interpreted all radiology studies.   Dia Crawford, MD [phone] Pager

## 2015-04-11 NOTE — Progress Notes (Signed)
Echocardiogram 2D Echocardiogram has been performed.  Jenna Brown 04/11/2015, 12:20 PM

## 2015-04-11 NOTE — Progress Notes (Signed)
PROGRESS NOTE  Subjective:   64 y.o. female with a PMHx of severe PVD, hyperlipidemia, TIA, essential HTN, CKD, , who was admitted to [facility] on 04/10/2015 for evaluation of dyspnea. .   She is much better today  - despite very little diuresis. Off teh BIPAP   Objective:    Vital Signs:   Temp:  [96.9 F (36.1 C)-98.2 F (36.8 C)] 97.3 F (36.3 C) (07/23 0405) Pulse Rate:  [94-121] 101 (07/23 0922) Resp:  [16-42] 24 (07/23 0922) BP: (100-141)/(62-87) 119/74 mmHg (07/23 0922) SpO2:  [88 %-100 %] 96 % (07/23 0922) FiO2 (%):  [50 %-60 %] 60 % (07/23 0325) Weight:  [76 kg (167 lb 8.8 oz)] 76 kg (167 lb 8.8 oz) (07/23 0405)  Last BM Date: 04/10/15   24-hour weight change: Weight change:   Weight trends: Filed Weights   04/10/15 0843 04/11/15 0405  Weight: 72.576 kg (160 lb) 76 kg (167 lb 8.8 oz)    Intake/Output:  07/22 0701 - 07/23 0700 In: 637.2 [P.O.:200; I.V.:187.2; IV Piggyback:250] Out: 1175 [Urine:1175] Total I/O In: 21 [I.V.:21] Out: 100 [Urine:100]   Physical Exam: BP 119/74 mmHg  Pulse 101  Temp(Src) 97.3 F (36.3 C) (Oral)  Resp 24  Ht 5\' 1"  (1.549 m)  Wt 76 kg (167 lb 8.8 oz)  BMI 31.67 kg/m2  SpO2 96%  Wt Readings from Last 3 Encounters:  04/11/15 76 kg (167 lb 8.8 oz)  01/02/15 68.04 kg (150 lb)  12/30/13 75.841 kg (167 lb 3.2 oz)    General: Vital signs reviewed and noted.   Head: Normocephalic, atraumatic.  Eyes: conjunctivae/corneas clear.  EOM's intact.   Throat: normal  Neck:  normal   Lungs:    decrease breath sounds, consolidation in both bases   Wheezing is better   Heart:  RR   Abdomen:  Soft, non-tender, non-distended    Extremities: No edema    Neurologic: A&O X3, CN II - XII are grossly intact.   Psych: Normal     Labs:  BMET:  Recent Labs  04/10/15 0850 04/10/15 1824 04/11/15 0230  NA 137  --  138  K 4.6  --  4.1  CL 102  --  106  CO2 20*  --  22  GLUCOSE 493* 241* 213*  BUN 26*  --  30*  CREATININE  1.49* 1.51* 1.42*  CALCIUM 9.1  --  8.8*    Liver function tests: No results for input(s): AST, ALT, ALKPHOS, BILITOT, PROT, ALBUMIN in the last 72 hours. No results for input(s): LIPASE, AMYLASE in the last 72 hours.  CBC:  Recent Labs  04/10/15 1824 04/11/15 0230  WBC 10.3 9.4  HGB 10.7* 9.8*  HCT 33.4* 30.9*  MCV 79.5 79.6  PLT 393 339    Cardiac Enzymes:  Recent Labs  04/10/15 1227 04/10/15 1824 04/10/15 2346  TROPONINI 0.30* 0.50* 0.67*    Coagulation Studies: No results for input(s): LABPROT, INR in the last 72 hours.  Other: Invalid input(s): POCBNP  Recent Labs  04/10/15 0850  DDIMER 0.85*    Recent Labs  04/10/15 1515  HGBA1C 11.3*   No results for input(s): CHOL, HDL, LDLCALC, TRIG, CHOLHDL in the last 72 hours. No results for input(s): TSH, T4TOTAL, T3FREE, THYROIDAB in the last 72 hours.  Invalid input(s): FREET3 No results for input(s): VITAMINB12, FOLATE, FERRITIN, TIBC, IRON, RETICCTPCT in the last 72 hours.   Other results:  EKG  ( personally reviewed )  Tele : NSR at 98   Medications:    Infusions: . heparin 1,050 Units/hr (04/11/15 0601)    Scheduled Medications: . antiseptic oral rinse  7 mL Mouth Rinse q12n4p  . aspirin  81 mg Oral Daily  . azithromycin  500 mg Intravenous Q24H  . carvedilol  3.125 mg Oral BID WC  . cefTRIAXone (ROCEPHIN)  IV  1 g Intravenous Q24H  . chlorhexidine  15 mL Mouth Rinse BID  . furosemide  40 mg Intravenous Daily  . insulin aspart  0-15 Units Subcutaneous TID WC  . insulin aspart  0-5 Units Subcutaneous QHS  . insulin aspart  3 Units Subcutaneous TID WC  . insulin glargine  6 Units Subcutaneous QHS  . nitroGLYCERIN  1 inch Topical 4 times per day  . pravastatin  80 mg Oral q1800  . sodium chloride  3 mL Intravenous Q12H  . vitamin C  500 mg Oral Daily  . zinc sulfate  220 mg Oral Daily    Assessment/ Plan:   Principal Problem:   Acute respiratory failure Active Problems:    Hypertension   Hyperlipidemia   Peripheral vascular disease   DMII (diabetes mellitus, type 2)   Tachycardia   CHF (congestive heart failure)   Acute kidney injury   Acute respiratory failure with hypoxemia   Acute pulmonary edema   Acute exacerbation of CHF (congestive heart failure)   Hypoxia  1. Respiratory distress:  Will need to repeat her CHR.  She has consolidated breath sounds in both bases c/w pneumonia ( more than just effusion )  She may need a CT eventually .  2. CHF ;:  Better - despite not being dieuresed much at all.  Echo today   3. ? NSTEMI:  Her troponin levels are minimally elevated.   This may be all due to CHF or the respiratory failure.  Will need a Lexiscan myoview at some point.   Will get another troponin today      Disposition:  Length of Stay: 1  Thayer Headings, Brooke Bonito., MD, [facility] 04/11/2015, 10:42 AM Office [phone] Pager [phone]

## 2015-04-11 NOTE — Progress Notes (Signed)
RT came into pt's room and her sat had dropped to 84. RT placed pt back on bi-pap. Pt is resting comfortably. RT will continue to monitor.

## 2015-04-11 NOTE — Progress Notes (Signed)
PULMONARY / CRITICAL CARE MEDICINE   Name: Jenna Brown MRN: [id number] DOB: [birth year]    ADMISSION DATE:  04/10/2015 CONSULTATION DATE:  04/10/2015  REFERRING MD :  EDP  CHIEF COMPLAINT:  Acute respiratory failure  INITIAL PRESENTATION: 64 year old female with PMH of CHF who presents to the hospital with SOB.  Noted to have acute hypoxemia and metabolic acidosis.  Patient was started on BiPAP for WOB and PCCM was called on consultation.  No evidence of infection.  Positive for orthopnea and PND.  STUDIES:  CXR 7/22>>>Pulmonary edema and bibasilar atelectasis.  SIGNIFICANT EVENTS: 7/22 admission to the hospital.  HISTORY OF PRESENT ILLNESS:  64 year old female with PMH of CHF who presents to the hospital with SOB.  Noted to have acute hypoxemia and metabolic acidosis.  Patient was started on BiPAP for WOB and PCCM was called on consultation.  No evidence of infection.  Positive for orthopnea and PND.  SUBJECTIVE: On BiPAP overnight, feels better.  VITAL SIGNS: Temp:  [96.9 F (36.1 C)-98.2 F (36.8 C)] 97.3 F (36.3 C) (07/23 0405) Pulse Rate:  [94-121] 101 (07/23 0922) Resp:  [16-42] 24 (07/23 0922) BP: (100-141)/(62-87) 119/74 mmHg (07/23 0922) SpO2:  [88 %-100 %] 96 % (07/23 0922) FiO2 (%):  [50 %-60 %] 60 % (07/23 0325) Weight:  [76 kg (167 lb 8.8 oz)] 76 kg (167 lb 8.8 oz) (07/23 0405) HEMODYNAMICS:   VENTILATOR SETTINGS: Vent Mode:  [-]  FiO2 (%):  [50 %-60 %] 60 % INTAKE / OUTPUT:  Intake/Output Summary (Last 24 hours) at 04/11/15 1057 Last data filed at 04/11/15 0900  Gross per 24 hour  Intake 658.17 ml  Output   1275 ml  Net -616.83 ml    PHYSICAL EXAMINATION: General:  Chronically ill appearing female.  Moderate respiratory distress. Neuro:  Alert and oriented but unable to verbalized clearly due to BiPAP mask.  Moving all ext to command. Head: [place]/AT. EENT:  PERRL, EOM-I and MMM. Cardiovascular:  RRR, Nl S1/S2, -M/R/G. Lungs:  Diffuse crackles,  bibasilar atelectasis. Abdomen:  Soft, NT, ND and +BS. Musculoskeletal:  -edema and -tenderness on left leg. Skin:  Intact.  LABS:  CBC  Recent Labs Lab 04/10/15 0850 04/10/15 1824 04/11/15 0230  WBC 11.5* 10.3 9.4  HGB 10.9* 10.7* 9.8*  HCT 34.2* 33.4* 30.9*  PLT 381 393 339   Coag's No results for input(s): APTT, INR in the last 168 hours. BMET  Recent Labs Lab 04/10/15 0850 04/10/15 1824 04/11/15 0230  NA 137  --  138  K 4.6  --  4.1  CL 102  --  106  CO2 20*  --  22  BUN 26*  --  30*  CREATININE 1.49* 1.51* 1.42*  GLUCOSE 493* 241* 213*   Electrolytes  Recent Labs Lab 04/10/15 0850 04/11/15 0230  CALCIUM 9.1 8.8*   Sepsis Markers  Recent Labs Lab 04/10/15 1227 04/10/15 1254  LATICACIDVEN 1.7 1.96  PROCALCITON 0.13  --    ABG  Recent Labs Lab 04/10/15 0947  PHART 7.292*  PCO2ART 37.5  PO2ART 78.0*   Liver Enzymes No results for input(s): AST, ALT, ALKPHOS, BILITOT, ALBUMIN in the last 168 hours. Cardiac Enzymes  Recent Labs Lab 04/10/15 1227 04/10/15 1824 04/10/15 2346  TROPONINI 0.30* 0.50* 0.67*   Glucose  Recent Labs Lab 04/10/15 1601 04/10/15 1757 04/10/15 2139 04/11/15 0816  GLUCAP 407* 307* 133* 216*    Imaging I reviewed the CXR myself, pulmonary edema and hypoinflation.  ASSESSMENT / PLAN:  PULMONARY OETT None A: Acute hypoxemic respiratory failure due to pulmonary edema. ?PE but unlikely. D-dimer 0.85. Hypoinflation on CXR. P:   - Change BiPAP to PRN. - Titrate O2 for sat of 88-92%, 5 liter currently. - No need for abx. - Heparin drip. - Would need VQ scan if able to lay flat but for now being anti-coag for cardiac reasons anyway. - IS for atelectasis.  CARDIOVASCULAR PIV A: Positive troponin. CHF history. P:  - Diureses as renal function allows. - Cards following. - NTG. - Heparin drip per cards. - 2D echo ordered and pending. - Cards managing.  RENAL A:  ARF, unknown cause. Metabolic  acidosis. P:   - Diureses as renal function allows. - Replace electrolytes as indicated. - BMET in AM.  GASTROINTESTINAL A:  No active issues. P:   - Monitor. - Heart healthy diet as ordered.  HEMATOLOGIC A:  Fully anti-coag. P:  - Full dose heparin. - Monitor for bleeding. - Transfuse per ICU protocol.  INFECTIOUS A:  No signs of active infection. P:   - Monitor off abx.  ENDOCRINE A:  DM.   P:   - ISS. - CBGs.  NEUROLOGIC A:  No active issues. P:   - Minimize sedation.  Improving from a respiratory standpoint, BiPAP only PRN at this stage.  PCCM will sign off, please call back if needed.  Discussed with bedside RN.  Rush Farmer, M.D. [facility] Pulmonary/Critical Care Medicine. Pager: [phone]. After hours pager: [phone].  04/11/2015, 10:57 AM

## 2015-04-12 ENCOUNTER — Inpatient Hospital Stay (HOSPITAL_COMMUNITY): Payer: Medicare Other

## 2015-04-12 DIAGNOSIS — J810 Acute pulmonary edema: Secondary | ICD-10-CM

## 2015-04-12 DIAGNOSIS — I5021 Acute systolic (congestive) heart failure: Secondary | ICD-10-CM

## 2015-04-12 DIAGNOSIS — E785 Hyperlipidemia, unspecified: Secondary | ICD-10-CM

## 2015-04-12 LAB — BASIC METABOLIC PANEL
Anion gap: 11 (ref 5–15)
Anion gap: 11 (ref 5–15)
BUN: 30 mg/dL — ABNORMAL HIGH (ref 6–20)
BUN: 28 mg/dL — ABNORMAL HIGH (ref 6–20)
CALCIUM: 8.5 mg/dL — AB (ref 8.9–10.3)
CHLORIDE: 101 mmol/L (ref 101–111)
CO2: 23 mmol/L (ref 22–32)
CO2: 22 mmol/L (ref 22–32)
CREATININE: 1.41 mg/dL — AB (ref 0.44–1.00)
CREATININE: 1.25 mg/dL — AB (ref 0.44–1.00)
Calcium: 8.5 mg/dL — ABNORMAL LOW (ref 8.9–10.3)
Chloride: 101 mmol/L (ref 101–111)
GFR calc Af Amer: 46 mL/min — ABNORMAL LOW (ref >60)
GFR calc non Af Amer: 39 mL/min — ABNORMAL LOW (ref >60)
GFR calc non Af Amer: 45 mL/min — ABNORMAL LOW (ref >60)
GFR, EST AFRICAN AMERICAN: 53 mL/min — AB (ref >60)
GLUCOSE: 189 mg/dL — AB (ref 65–99)
GLUCOSE: 320 mg/dL — AB (ref 65–99)
Potassium: 4.4 mmol/L (ref 3.5–5.1)
Potassium: 4.3 mmol/L (ref 3.5–5.1)
SODIUM: 135 mmol/L (ref 135–145)
Sodium: 134 mmol/L — ABNORMAL LOW (ref 135–145)

## 2015-04-12 LAB — LIPID PANEL
CHOLESTEROL: 216 mg/dL — AB (ref 0–200)
HDL: 35 mg/dL — AB (ref >40)
LDL Cholesterol: 153 mg/dL — ABNORMAL HIGH (ref 0–99)
TRIGLYCERIDES: 142 mg/dL (ref <150)
Total CHOL/HDL Ratio: 6.2 RATIO
VLDL: 28 mg/dL (ref 0–40)

## 2015-04-12 LAB — CBC
HEMATOCRIT: 31.2 % — AB (ref 36.0–46.0)
HEMATOCRIT: 31.4 % — AB (ref 36.0–46.0)
HEMOGLOBIN: 10.1 g/dL — AB (ref 12.0–15.0)
Hemoglobin: 9.9 g/dL — ABNORMAL LOW (ref 12.0–15.0)
MCH: 25.1 pg — ABNORMAL LOW (ref 26.0–34.0)
MCH: 25.6 pg — ABNORMAL LOW (ref 26.0–34.0)
MCHC: 31.7 g/dL (ref 30.0–36.0)
MCHC: 32.2 g/dL (ref 30.0–36.0)
MCV: 79.2 fL (ref 78.0–100.0)
MCV: 79.7 fL (ref 78.0–100.0)
Platelets: 380 10*3/uL (ref 150–400)
Platelets: 367 10*3/uL (ref 150–400)
RBC: 3.94 MIL/uL (ref 3.87–5.11)
RBC: 3.94 MIL/uL (ref 3.87–5.11)
RDW: 13.4 % (ref 11.5–15.5)
RDW: 13.4 % (ref 11.5–15.5)
WBC: 9.1 10*3/uL (ref 4.0–10.5)
WBC: 8.9 10*3/uL (ref 4.0–10.5)

## 2015-04-12 LAB — BLOOD GAS, ARTERIAL
Acid-base deficit: 3.4 mmol/L — ABNORMAL HIGH (ref 0.0–2.0)
BICARBONATE: 20.5 meq/L (ref 20.0–24.0)
Drawn by: 40415
O2 Content: 6 L/min
O2 SAT: 88.4 %
PATIENT TEMPERATURE: 98.6
TCO2: 21.5 mmol/L (ref 0–100)
pCO2 arterial: 33.3 mmHg — ABNORMAL LOW (ref 35.0–45.0)
pH, Arterial: 7.405 (ref 7.350–7.450)
pO2, Arterial: 54 mmHg — ABNORMAL LOW (ref 80.0–100.0)

## 2015-04-12 LAB — GLUCOSE, CAPILLARY
GLUCOSE-CAPILLARY: 245 mg/dL — AB (ref 65–99)
Glucose-Capillary: 198 mg/dL — ABNORMAL HIGH (ref 65–99)
Glucose-Capillary: 340 mg/dL — ABNORMAL HIGH (ref 65–99)
Glucose-Capillary: 313 mg/dL — ABNORMAL HIGH (ref 65–99)

## 2015-04-12 LAB — MAGNESIUM: Magnesium: 1.6 mg/dL — ABNORMAL LOW (ref 1.7–2.4)

## 2015-04-12 LAB — TROPONIN I: Troponin I: 0.27 ng/mL — ABNORMAL HIGH (ref <0.031)

## 2015-04-12 LAB — PROTIME-INR
INR: 1.09 (ref 0.00–1.49)
Prothrombin Time: 14.3 seconds (ref 11.6–15.2)

## 2015-04-12 LAB — PROCALCITONIN: Procalcitonin: 0.24 ng/mL

## 2015-04-12 LAB — HEPARIN LEVEL (UNFRACTIONATED): HEPARIN UNFRACTIONATED: 0.31 [IU]/mL (ref 0.30–0.70)

## 2015-04-12 MED ORDER — SODIUM CHLORIDE 0.9 % IV SOLN
INTRAVENOUS | Status: DC
  Administered 2015-04-12: 18:00:00 via INTRAVENOUS
  Administered 2015-04-13: 50 mL/h via INTRAVENOUS

## 2015-04-12 MED ORDER — INSULIN ASPART 100 UNIT/ML ~~LOC~~ SOLN
0.0000 [IU] | SUBCUTANEOUS | Status: DC
  Administered 2015-04-12: 11 [IU] via SUBCUTANEOUS
  Administered 2015-04-13 (×2): 5 [IU] via SUBCUTANEOUS
  Administered 2015-04-13: 3 [IU] via SUBCUTANEOUS
  Administered 2015-04-13 (×2): 5 [IU] via SUBCUTANEOUS

## 2015-04-12 MED ORDER — ASPIRIN 81 MG PO CHEW
81.0000 mg | CHEWABLE_TABLET | ORAL | Status: AC
  Administered 2015-04-13: 81 mg via ORAL
  Filled 2015-04-12: qty 1

## 2015-04-12 MED ORDER — CARVEDILOL 6.25 MG PO TABS
6.2500 mg | ORAL_TABLET | Freq: Two times a day (BID) | ORAL | Status: DC
Start: 2015-04-12 — End: 2015-04-15
  Administered 2015-04-12 – 2015-04-15 (×6): 6.25 mg via ORAL
  Filled 2015-04-12 (×8): qty 1

## 2015-04-12 MED ORDER — SODIUM CHLORIDE 0.9 % IV SOLN: 250.0000 mL | INTRAVENOUS | Status: DC | PRN

## 2015-04-12 MED ORDER — SODIUM CHLORIDE 0.9 % IJ SOLN
3.0000 mL | Freq: Two times a day (BID) | INTRAMUSCULAR | Status: DC
  Administered 2015-04-12 – 2015-04-13 (×2): 3 mL via INTRAVENOUS

## 2015-04-12 MED ORDER — SODIUM CHLORIDE 0.9 % IJ SOLN: 3.0000 mL | INTRAMUSCULAR | Status: DC | PRN

## 2015-04-12 MED ORDER — INSULIN GLARGINE 100 UNIT/ML ~~LOC~~ SOLN
17.0000 [IU] | Freq: Every day | SUBCUTANEOUS | Status: DC
  Administered 2015-04-12: 17 [IU] via SUBCUTANEOUS
  Filled 2015-04-12 (×2): qty 0.17

## 2015-04-12 MED ORDER — ROSUVASTATIN CALCIUM 20 MG PO TABS
20.0000 mg | ORAL_TABLET | Freq: Every day | ORAL | Status: DC
  Administered 2015-04-12 – 2015-04-21 (×10): 20 mg via ORAL
  Filled 2015-04-12 (×12): qty 1

## 2015-04-12 MED ORDER — INSULIN ASPART 100 UNIT/ML ~~LOC~~ SOLN: 8.0000 [IU] | Freq: Three times a day (TID) | SUBCUTANEOUS | Status: DC

## 2015-04-12 NOTE — Progress Notes (Signed)
Pt has been on a 6 LPM [place]. She is tolerating it well at this time. Pt sat was 95%. RT will continue to monitor and place back on bi-pap as needed.

## 2015-04-12 NOTE — Progress Notes (Signed)
[place] TEAM 1 - Stepdown/ICU TEAM Progress Note  Jenna Brown [id number] DOB: [birth year] DOA: 04/10/2015 PCP: [name], MD  Admit HPI / Brief Narrative: 63-year-[name], HLD, HTN, CAD Diabetes Type 2 uncontrolled, CVA, Rt AKA   Presented from home with acute shortness of breath. History was obtained from the patient who reported that she has been having worsening shortness of breath for last 3 days. She has noticed worsening orthopnea, PND, mostly sitting up with 4 pillows, has noticed weight gain of 6 lbs (unable to tell me the duration), swelling in her left leg. However this morning, patient had difficult time catching her breath and EMS was called. Per EMS, O2 sats were 85% on room air and she was placed on NRB mask. She denied any fevers, chills, productive cough or any wheezing. She did have discomfort in her left upper abdomen at the time of triage however denies any abdominal pain at this time. ER workup/EDP recommendations reviewed, patient was placed on BiPAP in ED. She was evaluated by cardiology in ED, Dr Acie Fredrickson recommended pulmonary critical care evaluation.  ER workup showed sodium 137, BUN 26, creatinine 1.49, glucose 493, BNP 395.3, troponin 0.15 WBCs 11.5, hemoglobin 10.9 Elevated d-dimer of 0.85   HPI/Subjective: 7/24 A/O 4, positive SOB but states significantly improved from 7/23, negative CP, negative N/V  Assessment/Plan: Acute respiratory failure with hypoxia:  -multifactorial to include fluid overload, NSTEMI, and HCAP? -DC Lasix, cardiology slowly hydrating in preparation for cardiac cath in the a.m. -Strict in and out since admission -1.82L -Daily a.m. Admission Weight= 72.5 kg            7/24 weight= 74 kg -Continue current antibiotics regimen for now -Xopenex nebulizer TID -Titrate O2 to maintain SPO2 89-93% -BiPAP PRN  Elevated d-dimer  -Most likely secondary to her renal dysfunction and NSTEMI, unfortunately cannot R/O PE   -patient currently unable to have CT angiogram or VQ scan.  -Patient currently on heparin drip secondary for NSTEMI therefore would not change treatment plan. However once more stable would attempt to obtain VQ scan, would need to be on long-term anticoagulation.  NSTEMI  -Echocardiogram; LVEF= 20% see results below  -Cardiac catheterization scheduled for [id number]   Acute Systolic CHF   -See acute respiratory failure -Increase Coreg 6.25 mg BID -DC Lasix 60 mg BID in preparation for cardiac catheterization   Hypertension -See acute systolic CHF   Peripheral vascular disease  - continue aspirin 81 mg daily  Hyperlipidemia -Obtain lipid panel -Changed to Crestor 20 mg daily   DM Type II (diabetes mellitus, type 2) uncontrolled - 7/22 hemoglobin A1c= 11.3 -Lipid panel ordered -Increase Lantus to 17 units daily -Continue moderate SSI -Start NovoLog 8 units QAC  Acute renal failure ( Unknown baseline)  -Cr improving with diuresis -Hold all nephrotoxic medication -Hold metformin, HCTZ, ibuprofen, losartan  - Monitor creatinine    Code Status: FULL Family Communication: No family present at time of exam Disposition Plan:  Per cardiology    Consultants: Dr. Wonda Cheng Nahser (cardiology) Dr.Wesam Kathryne Sharper [facility] M)   Procedure/Significant Events: 7/23 echocardiogram;- LVEF=20%. The entire mid-ventricle and apex are akinetic.(could be a Takotsubo ventricle).   Culture 7/22 urine NGTD 7/22 blood left forearm/antecubital NGTD   Antibiotics:   DVT prophylaxis: Heparin drip   Devices    LINES / TUBES:      Continuous Infusions: . heparin 1,050 Units/hr (04/12/15 0551)    Objective: VITAL SIGNS: Temp: 98 F (36.7 C) (07/24  [id number]) Temp Source: Oral (07/24 0757) BP: 118/69 mmHg (07/24 0757) Pulse Rate: 101 (07/24 0757) SPO2; FIO2:   Intake/Output Summary (Last 24 hours) at 04/12/15 [id number] Last data filed at 04/12/15 0600  Gross per 24 hour  Intake   789.5 ml  Output   1325 ml  Net -535.5 ml     Exam: General: A/O 4, positive SOB, No acute respiratory distress Eyes: Negative headache, eye pain, double vision, negative scleral hemorrhage ENT: Negative Runny nose, negative ear pain, negative tinnitus, negative gingival bleeding Neck:  Negative scars, masses, torticollis, lymphadenopathy, JVD Lungs: Clear to auscultation bilateral upper lobes, poor to absent breath sounds remainder of lung fields bilaterally. Negative wheezes, mild bibasilar crackles Cardiovascular: Tachycardic, Regular rhythm without murmur gallop or rub normal S1 and S2 Abdomen:negative abdominal pain, negative dysphagia, Nontender, nondistended, soft, bowel sounds positive, no rebound, no ascites, no appreciable mass Extremities: No significant cyanosis, clubbing, or edema left lower extremities, right AKA negative ulcers/lesions Psychiatric:  Negative depression, negative anxiety, negative fatigue, negative mania  Neurologic:  Cranial nerves II through XII intact, tongue/uvula midline, all extremities muscle strength 5/5, sensation intact throughout,dysarthria, negative expressive aphasia, negative receptive aphasia.     Data Reviewed: Basic Metabolic Panel:  Recent Labs Lab 04/10/15 0850 04/10/15 1824 04/11/15 0230 04/12/15 0450  NA 137  --  138 135  K 4.6  --  4.1 4.4  CL 102  --  106 101  CO2 20*  --  22 23  GLUCOSE 493* 241* 213* 189*  BUN 26*  --  30* 30*  CREATININE 1.49* 1.51* 1.42* 1.41*  CALCIUM 9.1  --  8.8* 8.5*  MG  --   --   --  1.6*   Liver Function Tests: No results for input(s): AST, ALT, ALKPHOS, BILITOT, PROT, ALBUMIN in the last 168 hours. No results for input(s): LIPASE, AMYLASE in the last 168 hours. No results for input(s): AMMONIA in the last 168 hours. CBC:  Recent Labs Lab 04/10/15 0850 04/10/15 1824 04/11/15 0230 04/12/15 0450  WBC 11.5* 10.3 9.4 9.1  HGB 10.9* 10.7* 9.8* 9.9*  HCT 34.2* 33.4* 30.9* 31.2*  MCV 79.7  79.5 79.6 79.2  PLT 381 393 339 380   Cardiac Enzymes:  Recent Labs Lab 04/10/15 1227 04/10/15 1824 04/10/15 2346  TROPONINI 0.30* 0.50* 0.67*   BNP (last 3 results)  Recent Labs  04/10/15 0850  BNP 395.3*    ProBNP (last 3 results) No results for input(s): PROBNP in the last 8760 hours.  CBG:  Recent Labs Lab 04/10/15 2139 04/11/15 0816 04/11/15 1214 04/11/15 1527 04/11/15 2133  GLUCAP 133* 216* 269* 355* 98    Recent Results (from the past 240 hour(s))  Urine culture     Status: None   Collection Time: 04/10/15 12:48 PM  Result Value Ref Range Status   Specimen Description URINE, RANDOM  Final   Special Requests NONE  Final   Culture NO GROWTH 1 DAY  Final   Report Status 04/11/2015 FINAL  Final  Culture, blood (routine x 2)     Status: None (Preliminary result)   Collection Time: 04/10/15  3:15 PM  Result Value Ref Range Status   Specimen Description BLOOD LEFT FOREARM  Final   Special Requests BOTTLES DRAWN AEROBIC ONLY 5CC  Final   Culture NO GROWTH < 24 HOURS  Final   Report Status PENDING  Incomplete  Culture, blood (routine x 2)     Status: None (Preliminary result)   Collection Time:  04/10/15  3:25 PM  Result Value Ref Range Status   Specimen Description BLOOD LEFT ANTECUBITAL  Final   Special Requests BOTTLES DRAWN AEROBIC AND ANAEROBIC 5CC 5CC  Final   Culture NO GROWTH < 24 HOURS  Final   Report Status PENDING  Incomplete     Studies:  Recent x-ray studies have been reviewed in detail by the Attending Physician  Scheduled Meds:  Scheduled Meds: . antiseptic oral rinse  7 mL Mouth Rinse q12n4p  . aspirin  81 mg Oral Daily  . azithromycin  500 mg Intravenous Q24H  . carvedilol  3.125 mg Oral BID WC  . cefTRIAXone (ROCEPHIN)  IV  1 g Intravenous Q24H  . chlorhexidine  15 mL Mouth Rinse BID  . furosemide  60 mg Intravenous BID  . insulin aspart  0-15 Units Subcutaneous TID WC  . insulin aspart  0-5 Units Subcutaneous QHS  . insulin  aspart  3 Units Subcutaneous TID WC  . insulin glargine  12 Units Subcutaneous QHS  . levalbuterol  1.25 mg Nebulization TID  . nitroGLYCERIN  1 inch Topical 4 times per day  . pravastatin  80 mg Oral q1800  . sodium chloride  3 mL Intravenous Q12H  . vitamin C  500 mg Oral Daily  . zinc sulfate  220 mg Oral Daily    Time spent on care of this patient: 40 mins   Meara Wiechman, Geraldo Docker , MD  [facility] Office  [phone] Pager [phone]  On-Call/Text Page:      [email or web address]      password [email or web address]  If 7PM-7AM, please contact night-coverage [email or web address] Password [email or web address] 04/12/2015, 8:08 AM   LOS: 2 days   Care during the described time interval was provided by me .  I have reviewed this patient's available data, including medical history, events of note, physical examination, and all test results as part of my evaluation. I have personally reviewed and interpreted all radiology studies.   Dia Crawford, MD [phone] Pager

## 2015-04-12 NOTE — Progress Notes (Signed)
ANTICOAGULATION CONSULT NOTE - [facility] for Heparin Indication: chest pain/ACS  Allergies  Allergen Reactions  . Plavix [Clopidogrel Bisulfate] Itching  . Codeine Other (See Comments)    "makes me feel strange"  . Tylenol With Codeine #3 [Acetaminophen-Codeine] Other (See Comments)    "doesn't make me feel right"  . Tylenol [Acetaminophen] Other (See Comments)    "Doesn't feel right"     Patient Measurements: Height: 5\' 1"  (154.9 cm) Weight: 163 lb 2.3 oz (74 kg) IBW/kg (Calculated) : 47.8 Heparin Dosing Weight: 73 kg   Vital Signs: Temp: 98.2 F (36.8 C) (07/24 1142) Temp Source: Oral (07/24 1142) BP: 116/69 mmHg (07/24 1142) Pulse Rate: 99 (07/24 1000)  Labs:  Recent Labs  04/10/15 1824 04/10/15 2346 04/11/15 0100 04/11/15 0230 04/11/15 0900 04/12/15 0450 04/12/15 0943 04/12/15 1140  HGB 10.7*  --   --  9.8*  --  9.9*  --  10.1*  HCT 33.4*  --   --  30.9*  --  31.2*  --  31.4*  PLT 393  --   --  339  --  380  --  367  LABPROT  --   --   --   --   --   --   --  14.3  INR  --   --   --   --   --   --   --  1.09  HEPARINUNFRC 0.37  --  0.28*  --  0.37 0.31  --   --   CREATININE 1.51*  --   --  1.42*  --  1.41*  --  1.25*  TROPONINI 0.50* 0.67*  --   --   --   --  0.27*  --     Estimated Creatinine Clearance: 43.5 mL/min (by C-G formula based on Cr of 1.25).   Medical History: Past Medical History  Diagnosis Date  . Hyperlipidemia     takes Simvastatin daily  . Peripheral vascular disease   . Stroke     TIA history  . PONV (postoperative nausea and vomiting)   . Hypertension     takes Amlodipine/HCTZ/Losartan daily  . Vertigo     takes Ativert prn  . Hemorrhoids   . Anxiety     takes Xanax prn  . Myocardial infarction 1990's  . Type II diabetes mellitus     takes Glucovance and Januvia daily  . H/O hiatal hernia   . GERD (gastroesophageal reflux disease)   . Headache(784.0)     when b/p is elevated  . Migraines   .  Arthritis     Medications:  Prescriptions prior to admission  Medication Sig Dispense Refill Last Dose  . ALPRAZolam (XANAX) 0.25 MG tablet Take 0.25 mg by mouth 3 (three) times daily as needed for anxiety.    Past Week at Unknown time  . amLODipine (NORVASC) 10 MG tablet Take 10 mg by mouth daily.    04/09/2015 at Unknown time  . aspirin 81 MG chewable tablet Chew 81 mg by mouth daily.   04/09/2015 at Unknown time  . glipiZIDE-metformin (METAGLIP) 5-500 MG per tablet Take 2 tablets by mouth 2 (two) times daily.  0 04/09/2015 at Unknown time  . hydrochlorothiazide (HYDRODIURIL) 25 MG tablet Take 25 mg by mouth daily.    04/09/2015 at Unknown time  . ibuprofen (ADVIL,MOTRIN) 200 MG tablet Take 400 mg by mouth every 6 (six) hours as needed for mild pain or moderate pain.  04/09/2015 at Unknown time  . LIVALO 4 MG TABS Take 4 mg by mouth daily.  0 04/09/2015 at Unknown time  . losartan (COZAAR) 100 MG tablet Take 100 mg by mouth daily.    04/09/2015 at Unknown time  . meclizine (ANTIVERT) 25 MG tablet Take 25 mg by mouth daily as needed for dizziness.    Past Week at Unknown time  . vitamin C (ASCORBIC ACID) 500 MG tablet Take 500 mg by mouth daily.   04/09/2015 at Unknown time  . zinc sulfate 220 MG capsule Take 220 mg by mouth daily.   04/09/2015 at Unknown time  . atorvastatin (LIPITOR) 20 MG tablet Take 1 tablet (20 mg total) by mouth daily at 6 PM. (Patient not taking: Reported on 04/10/2015) 30 tablet 2 Not Taking at Unknown time    Assessment: 64yo female presented to the ED with SOB that started 3 days ago. Her troponin is mildly elevated, DDimer slightly elevated 0.85. Pharmacy consulted to start IV heparin for ACS, r/o PE - plan VQ or CT when able to lie flat.  H/H mildly low but stable, PLT WNL, no bleeding noted. Patient not on any anticoagulants prior to admission. Heparin drip 1050 uts/hr HL 0.31   Goal of Therapy:  Heparin level 0.3-0.7 units/ml Monitor platelets by anticoagulation  protocol: Yes   Plan:  -Continue heparin drip 1050 units/hr -Monitor daily CBC, HL, and s/sx of bleeding   Bonnita Nasuti Pharm.D. CPP, BCPS Clinical Pharmacist [phone] 04/12/2015 1:22 PM

## 2015-04-12 NOTE — Progress Notes (Signed)
PROGRESS NOTE  Subjective:   64 y.o. female with a PMHx of severe PVD, hyperlipidemia, TIA, essential HTN, CKD, , who was admitted to [facility] on 04/10/2015 for evaluation of dyspnea. .   She is much better today  - despite very little diuresis. Desaturated briefly last night, requiree BIPAP for a while  CXR still shows bilateral pleural effusions Echo shows severe LV dysfunction with akinesis of the mid anterior wall and apex.     Objective:    Vital Signs:   Temp:  [98 F (36.7 C)-98.7 F (37.1 C)] 98 F (36.7 C) (07/24 0757) Pulse Rate:  [95-116] 101 (07/24 0757) Resp:  [18-29] 21 (07/24 0757) BP: (87-129)/(58-76) 118/69 mmHg (07/24 0757) SpO2:  [89 %-97 %] 93 % (07/24 0757) FiO2 (%):  [60 %] 60 % (07/23 1815) Weight:  [74 kg (163 lb 2.3 oz)] 74 kg (163 lb 2.3 oz) (07/24 0500)  Last BM Date: 04/10/15   24-hour weight change: Weight change: 1.425 kg (3 lb 2.3 oz)  Weight trends: Filed Weights   04/10/15 0843 04/11/15 0405 04/12/15 0500  Weight: 72.576 kg (160 lb) 76 kg (167 lb 8.8 oz) 74 kg (163 lb 2.3 oz)    Intake/Output:  07/23 0701 - 07/24 0700 In: 800 [P.O.:220; I.V.:280; IV Piggyback:300] Out: 1325 [Urine:1325] Total I/O In: 10.5 [I.V.:10.5] Out: -    Physical Exam: BP 118/69 mmHg  Pulse 101  Temp(Src) 98 F (36.7 C) (Oral)  Resp 21  Ht 5\' 1"  (1.549 m)  Wt 74 kg (163 lb 2.3 oz)  BMI 30.84 kg/m2  SpO2 93%  Wt Readings from Last 3 Encounters:  04/12/15 74 kg (163 lb 2.3 oz)  01/02/15 68.04 kg (150 lb)  12/30/13 75.841 kg (167 lb 3.2 oz)    General: Vital signs reviewed and noted.   Head: Normocephalic, atraumatic.  Eyes: conjunctivae/corneas clear.  EOM's intact.   Throat: normal  Neck:  normal   Lungs:    decrease breath sounds, consolidation in both bases   Wheezing is better   Heart:  RR   Abdomen:  Soft, non-tender, non-distended    Extremities: No edema    Neurologic: A&O X3, CN II - XII are grossly intact.   Psych: Normal      Labs:  BMET:  Recent Labs  04/11/15 0230 04/12/15 0450  NA 138 135  K 4.1 4.4  CL 106 101  CO2 22 23  GLUCOSE 213* 189*  BUN 30* 30*  CREATININE 1.42* 1.41*  CALCIUM 8.8* 8.5*  MG  --  1.6*    Liver function tests: No results for input(s): AST, ALT, ALKPHOS, BILITOT, PROT, ALBUMIN in the last 72 hours. No results for input(s): LIPASE, AMYLASE in the last 72 hours.  CBC:  Recent Labs  04/11/15 0230 04/12/15 0450  WBC 9.4 9.1  HGB 9.8* 9.9*  HCT 30.9* 31.2*  MCV 79.6 79.2  PLT 339 380    Cardiac Enzymes:  Recent Labs  04/10/15 1227 04/10/15 1824 04/10/15 2346  TROPONINI 0.30* 0.50* 0.67*    Coagulation Studies: No results for input(s): LABPROT, INR in the last 72 hours.  Other: Invalid input(s): POCBNP  Recent Labs  04/10/15 0850  DDIMER 0.85*    Recent Labs  04/10/15 1515  HGBA1C 11.3*    Recent Labs  04/12/15 0450  CHOL 216*  HDL 35*  LDLCALC 153*  TRIG 142  CHOLHDL 6.2   No results for input(s): TSH, T4TOTAL, T3FREE, THYROIDAB in the  last 72 hours.  Invalid input(s): FREET3 No results for input(s): VITAMINB12, FOLATE, FERRITIN, TIBC, IRON, RETICCTPCT in the last 72 hours.   Other results:  EKG  ( personally reviewed )  Tele : NSR at 98   Medications:    Infusions: . heparin 1,050 Units/hr (04/12/15 0551)    Scheduled Medications: . antiseptic oral rinse  7 mL Mouth Rinse q12n4p  . aspirin  81 mg Oral Daily  . azithromycin  500 mg Intravenous Q24H  . carvedilol  3.125 mg Oral BID WC  . cefTRIAXone (ROCEPHIN)  IV  1 g Intravenous Q24H  . chlorhexidine  15 mL Mouth Rinse BID  . furosemide  60 mg Intravenous BID  . insulin aspart  0-15 Units Subcutaneous TID WC  . insulin aspart  0-5 Units Subcutaneous QHS  . insulin aspart  3 Units Subcutaneous TID WC  . insulin glargine  12 Units Subcutaneous QHS  . levalbuterol  1.25 mg Nebulization TID  . nitroGLYCERIN  1 inch Topical 4 times per day  . pravastatin  80 mg  Oral q1800  . sodium chloride  3 mL Intravenous Q12H  . vitamin C  500 mg Oral Daily  . zinc sulfate  220 mg Oral Daily    Assessment/ Plan:   Principal Problem:   Acute respiratory failure with hypoxia Active Problems:   Hypertension   Hyperlipidemia   Peripheral vascular disease   DMII (diabetes mellitus, type 2)   Tachycardia   CHF (congestive heart failure)   Acute respiratory failure   Acute kidney injury   Acute respiratory failure with hypoxemia   Acute pulmonary edema   Acute exacerbation of CHF (congestive heart failure)   Hypoxia   Atelectasis   Elevated d-dimer   NSTEMI (non-ST elevated myocardial infarction)   Acute systolic CHF (congestive heart failure)   Essential hypertension   HLD (hyperlipidemia)   Metabolic acidosis   Diabetes type 2, uncontrolled   Acute renal failure syndrome  1. Respiratory distress:  Will need to repeat her CHR.  She has consolidated breath sounds in both bases c/w pneumonia ( more than just effusion )  She may need a CT eventually .  2. CHF ;:  Better - despite not being dieuresed much at all.    3. ? NSTEMI:  Her troponin levels are minimally elevated.   This may be all due to CHF or the respiratory failure.  Her echo shows akinesis of the mid anterior wall and apex.   ? Takotsubo syndrome She is on Carvedilol - will increase to 6.25 mg  BID   She will need a cath on Monday .  Will repeat Troponin today   4. Hyperlipidemia:  LDL is 153.  Total chol is 216 Will DC pravachol Start crestor 20 a day     Disposition:  Length of Stay: 2  Thayer Headings, Brooke Bonito., MD, [facility] 04/12/2015, 8:23 AM Office [phone] Pager [phone]

## 2015-04-13 ENCOUNTER — Encounter (HOSPITAL_COMMUNITY)
Admission: EM | Disposition: A | Discharge status: Skilled Nursing Facility | Payer: Self-pay | Source: Home / Self Care | Attending: Internal Medicine

## 2015-04-13 DIAGNOSIS — N179 Acute kidney failure, unspecified: Secondary | ICD-10-CM

## 2015-04-13 DIAGNOSIS — I2510 Atherosclerotic heart disease of native coronary artery without angina pectoris: Secondary | ICD-10-CM

## 2015-04-13 HISTORY — PX: CARDIAC CATHETERIZATION: SHX172

## 2015-04-13 LAB — BASIC METABOLIC PANEL
ANION GAP: 11 (ref 5–15)
BUN: 28 mg/dL — ABNORMAL HIGH (ref 6–20)
CHLORIDE: 101 mmol/L (ref 101–111)
CO2: 23 mmol/L (ref 22–32)
CREATININE: 1.27 mg/dL — AB (ref 0.44–1.00)
Calcium: 8.3 mg/dL — ABNORMAL LOW (ref 8.9–10.3)
GFR calc Af Amer: 52 mL/min — ABNORMAL LOW (ref >60)
GFR calc non Af Amer: 45 mL/min — ABNORMAL LOW (ref >60)
Glucose, Bld: 194 mg/dL — ABNORMAL HIGH (ref 65–99)
Potassium: 3.7 mmol/L (ref 3.5–5.1)
SODIUM: 135 mmol/L (ref 135–145)

## 2015-04-13 LAB — POCT I-STAT 3, VENOUS BLOOD GAS (G3P V)
ACID-BASE DEFICIT: 5 mmol/L — AB (ref 0.0–2.0)
Bicarbonate: 20.5 mEq/L (ref 20.0–24.0)
O2 SAT: 32 %
TCO2: 22 mmol/L (ref 0–100)
pCO2, Ven: 38.5 mmHg — ABNORMAL LOW (ref 45.0–50.0)
pH, Ven: 7.333 — ABNORMAL HIGH (ref 7.250–7.300)
pO2, Ven: 21 mmHg — CL (ref 30.0–45.0)

## 2015-04-13 LAB — POCT I-STAT 3, ART BLOOD GAS (G3+)
Acid-base deficit: 6 mmol/L — ABNORMAL HIGH (ref 0.0–2.0)
BICARBONATE: 18.9 meq/L — AB (ref 20.0–24.0)
O2 Saturation: 88 %
TCO2: 20 mmol/L (ref 0–100)
pCO2 arterial: 32.8 mmHg — ABNORMAL LOW (ref 35.0–45.0)
pH, Arterial: 7.369 (ref 7.350–7.450)
pO2, Arterial: 55 mmHg — ABNORMAL LOW (ref 80.0–100.0)

## 2015-04-13 LAB — CBC
HCT: 30.9 % — ABNORMAL LOW (ref 36.0–46.0)
Hemoglobin: 9.9 g/dL — ABNORMAL LOW (ref 12.0–15.0)
MCH: 25.5 pg — ABNORMAL LOW (ref 26.0–34.0)
MCHC: 32 g/dL (ref 30.0–36.0)
MCV: 79.6 fL (ref 78.0–100.0)
Platelets: 340 10*3/uL (ref 150–400)
RBC: 3.88 MIL/uL (ref 3.87–5.11)
RDW: 13.6 % (ref 11.5–15.5)
WBC: 9.1 10*3/uL (ref 4.0–10.5)

## 2015-04-13 LAB — POCT ACTIVATED CLOTTING TIME: Activated Clotting Time: 134 seconds

## 2015-04-13 LAB — GLUCOSE, CAPILLARY
GLUCOSE-CAPILLARY: 183 mg/dL — AB (ref 65–99)
GLUCOSE-CAPILLARY: 224 mg/dL — AB (ref 65–99)
GLUCOSE-CAPILLARY: 89 mg/dL (ref 65–99)
Glucose-Capillary: 206 mg/dL — ABNORMAL HIGH (ref 65–99)
Glucose-Capillary: 202 mg/dL — ABNORMAL HIGH (ref 65–99)
Glucose-Capillary: 228 mg/dL — ABNORMAL HIGH (ref 65–99)

## 2015-04-13 LAB — HEPARIN LEVEL (UNFRACTIONATED): Heparin Unfractionated: 0.19 IU/mL — ABNORMAL LOW (ref 0.30–0.70)

## 2015-04-13 LAB — MRSA PCR SCREENING: MRSA by PCR: NEGATIVE

## 2015-04-13 LAB — MAGNESIUM: Magnesium: 1.4 mg/dL — ABNORMAL LOW (ref 1.7–2.4)

## 2015-04-13 SURGERY — RIGHT/LEFT HEART CATH AND CORONARY ANGIOGRAPHY
Anesthesia: LOCAL

## 2015-04-13 MED ORDER — INSULIN ASPART 100 UNIT/ML ~~LOC~~ SOLN
0.0000 [IU] | SUBCUTANEOUS | Status: DC
  Administered 2015-04-13: 4 [IU] via SUBCUTANEOUS
  Administered 2015-04-14: 3 [IU] via SUBCUTANEOUS
  Administered 2015-04-14: 4 [IU] via SUBCUTANEOUS
  Administered 2015-04-14: 3 [IU] via SUBCUTANEOUS
  Administered 2015-04-14: 4 [IU] via SUBCUTANEOUS
  Administered 2015-04-15: 11 [IU] via SUBCUTANEOUS
  Administered 2015-04-15: 7 [IU] via SUBCUTANEOUS

## 2015-04-13 MED ORDER — IOHEXOL 350 MG/ML SOLN
INTRAVENOUS | Status: DC | PRN
  Administered 2015-04-13: 70 mL via INTRAVENOUS

## 2015-04-13 MED ORDER — MAGNESIUM SULFATE 2 GM/50ML IV SOLN
2.0000 g | Freq: Once | INTRAVENOUS | Status: AC
  Administered 2015-04-13: 2 g via INTRAVENOUS
  Filled 2015-04-13: qty 50

## 2015-04-13 MED ORDER — SODIUM CHLORIDE 0.9 % IJ SOLN
3.0000 mL | Freq: Two times a day (BID) | INTRAMUSCULAR | Status: DC
  Administered 2015-04-14: 3 mL via INTRAVENOUS

## 2015-04-13 MED ORDER — SODIUM CHLORIDE 0.9 % WEIGHT BASED INFUSION: 1.0000 mL/kg/h | INTRAVENOUS | Status: AC

## 2015-04-13 MED ORDER — MAGNESIUM SULFATE 2 GM/50ML IV SOLN: 2.0000 g | Freq: Once | INTRAVENOUS | Status: DC

## 2015-04-13 MED ORDER — INSULIN GLARGINE 100 UNIT/ML ~~LOC~~ SOLN
22.0000 [IU] | Freq: Every day | SUBCUTANEOUS | Status: DC
  Administered 2015-04-13: 22 [IU] via SUBCUTANEOUS
  Filled 2015-04-13: qty 0.22

## 2015-04-13 MED ORDER — SODIUM CHLORIDE 0.9 % IV SOLN: 250.0000 mL | INTRAVENOUS | Status: DC | PRN

## 2015-04-13 MED ORDER — HEPARIN (PORCINE) IN NACL 2-0.9 UNIT/ML-% IJ SOLN
INTRAMUSCULAR | Status: AC
  Filled 2015-04-13: qty 1500

## 2015-04-13 MED ORDER — LIDOCAINE HCL (PF) 1 % IJ SOLN
INTRAMUSCULAR | Status: AC
  Filled 2015-04-13: qty 30

## 2015-04-13 MED ORDER — INSULIN ASPART 100 UNIT/ML ~~LOC~~ SOLN: 10.0000 [IU] | Freq: Three times a day (TID) | SUBCUTANEOUS | Status: DC

## 2015-04-13 MED ORDER — SODIUM CHLORIDE 0.9 % IJ SOLN
3.0000 mL | INTRAMUSCULAR | Status: DC | PRN
  Administered 2015-04-13: 3 mL via INTRAVENOUS
  Filled 2015-04-13: qty 3

## 2015-04-13 MED ORDER — ASPIRIN 81 MG PO CHEW: 81.0000 mg | CHEWABLE_TABLET | Freq: Every day | ORAL | Status: DC

## 2015-04-13 MED ORDER — ACETAMINOPHEN 325 MG PO TABS
650.0000 mg | ORAL_TABLET | ORAL | Status: DC | PRN
  Administered 2015-04-20: 650 mg via ORAL
  Filled 2015-04-13 (×2): qty 2

## 2015-04-13 MED ORDER — ONDANSETRON HCL 4 MG/2ML IJ SOLN: 4.0000 mg | Freq: Four times a day (QID) | INTRAMUSCULAR | Status: DC | PRN

## 2015-04-13 SURGICAL SUPPLY — 12 items
CATH INFINITI 5FR ANG PIGTAIL (CATHETERS) ×1 IMPLANT
CATH INFINITI 5FR JL4 (CATHETERS) ×1 IMPLANT
CATH INFINITI JR4 5F (CATHETERS) ×1 IMPLANT
CATH SWAN GANZ 7F STRAIGHT (CATHETERS) ×1 IMPLANT
KIT HEART LEFT (KITS) ×2 IMPLANT
KIT HEART RIGHT NAMIC (KITS) ×2 IMPLANT
PACK CARDIAC CATHETERIZATION (CUSTOM PROCEDURE TRAY) ×2 IMPLANT
SHEATH PINNACLE 5F 10CM (SHEATH) IMPLANT
SHEATH PINNACLE 7F 10CM (SHEATH) ×2 IMPLANT
TRANSDUCER W/STOPCOCK (MISCELLANEOUS) ×3 IMPLANT
WIRE EMERALD 3MM-J .035X150CM (WIRE) IMPLANT
WIRE HITORQ VERSACORE ST 145CM (WIRE) ×2 IMPLANT

## 2015-04-13 NOTE — Care Management Important Message (Signed)
Important Message  Patient Details  Name: Jenna Brown MRN: [id number] Date of Birth: [birth year]   Medicare Important Message Given:  Yes-second notification given    Pricilla Handler 04/13/2015, 2:18 PM

## 2015-04-13 NOTE — Progress Notes (Signed)
ANTICOAGULATION CONSULT NOTE - Follow Up Consult  Pharmacy Consult for Heparin  Indication: chest pain/ACS  Allergies  Allergen Reactions  . Plavix [Clopidogrel Bisulfate] Itching  . Codeine Other (See Comments)    "makes me feel strange"  . Tylenol With Codeine #3 [Acetaminophen-Codeine] Other (See Comments)    "doesn't make me feel right"  . Tylenol [Acetaminophen] Other (See Comments)    "Doesn't feel right"     Patient Measurements: Height: 5\' 1"  (154.9 cm) Weight: 160 lb 15 oz (73 kg) IBW/kg (Calculated) : 47.8  Vital Signs: Temp: 98.3 F (36.8 C) (07/25 0419) Temp Source: Oral (07/25 0419) BP: 138/71 mmHg (07/25 0419) Pulse Rate: 101 (07/25 0419)  Labs:  Recent Labs  04/10/15 1824 04/10/15 2346  04/11/15 0900 04/12/15 0450 04/12/15 0943 04/12/15 1140 04/13/15 0225  HGB 10.7*  --   < >  --  9.9*  --  10.1* 9.9*  HCT 33.4*  --   < >  --  31.2*  --  31.4* 30.9*  PLT 393  --   < >  --  380  --  367 340  LABPROT  --   --   --   --   --   --  14.3  --   INR  --   --   --   --   --   --  1.09  --   HEPARINUNFRC 0.37  --   < > 0.37 0.31  --   --  0.19*  CREATININE 1.51*  --   < >  --  1.41*  --  1.25* 1.27*  TROPONINI 0.50* 0.67*  --   --   --  0.27*  --   --   < > = values in this interval not displayed.  Estimated Creatinine Clearance: 42.5 mL/min (by C-G formula based on Cr of 1.27).  Assessment: Sub-therapeutic heparin level, no issues per RN.   Goal of Therapy:  Heparin level 0.3-0.7 units/ml Monitor platelets by anticoagulation protocol: Yes   Plan:  -Increase heparin to 1200 units/hr -1400 HL, pending cath -Daily CBC/HL -Monitor for bleeding  [place], [place] 04/13/2015,7:05 AM

## 2015-04-13 NOTE — H&P (View-Only) (Signed)
SUBJECTIVE:  Feels ok.  Prefers to sit up but is not [facility] wen she is lying flat.   OBJECTIVE:   Vitals:   Filed Vitals:   04/13/15 0400 04/13/15 0419 04/13/15 0734 04/13/15 0801  BP: 138/71 138/71 152/93   Pulse: 101 101 110   Temp:  98.3 F (36.8 C) 98.3 F (36.8 C)   TempSrc:  Oral Oral   Resp: 23 24 20    Height:      Weight:  160 lb 15 oz (73 kg)    SpO2: 96% 93% 96% 100%   I&O's:   Intake/Output Summary (Last 24 hours) at 04/13/15 0913 Last data filed at 04/13/15 [id number]  Gross per 24 hour  Intake 1486.05 ml  Output   1800 ml  Net -313.95 ml   TELEMETRY: Reviewed telemetry pt in Sinus tach:     PHYSICAL EXAM General: Well developed, well nourished, in no acute distress Head:   Normal cephalic and atramatic  Lungs:   Clear bilaterally to auscultation. Heart:   Tachycardic, regular S1 S2  No JVD.   Abdomen: abdomen soft and non-tender Msk:  Back normal,  Normal strength and tone for age. Extremities:   No edema.   Neuro: Alert and oriented. Psych:  Normal affect, responds appropriately Skin: No rash   LABS: Basic Metabolic Panel:  Recent Labs  04/12/15 0450 04/12/15 1140 04/13/15 0225  NA 135 134* 135  K 4.4 4.3 3.7  CL 101 101 101  CO2 23 22 23   GLUCOSE 189* 320* 194*  BUN 30* 28* 28*  CREATININE 1.41* 1.25* 1.27*  CALCIUM 8.5* 8.5* 8.3*  MG 1.6*  --  1.4*   Liver Function Tests: No results for input(s): AST, ALT, ALKPHOS, BILITOT, PROT, ALBUMIN in the last 72 hours. No results for input(s): LIPASE, AMYLASE in the last 72 hours. CBC:  Recent Labs  04/12/15 1140 04/13/15 0225  WBC 8.9 9.1  HGB 10.1* 9.9*  HCT 31.4* 30.9*  MCV 79.7 79.6  PLT 367 340   Cardiac Enzymes:  Recent Labs  04/10/15 1824 04/10/15 2346 04/12/15 0943  TROPONINI 0.50* 0.67* 0.27*   BNP: Invalid input(s): POCBNP D-Dimer: No results for input(s): DDIMER in the last 72 hours. Hemoglobin A1C:  Recent Labs  04/10/15 1515  HGBA1C 11.3*   Fasting Lipid  Panel:  Recent Labs  04/12/15 0450  CHOL 216*  HDL 35*  LDLCALC 153*  TRIG 142  CHOLHDL 6.2   Thyroid Function Tests: No results for input(s): TSH, T4TOTAL, T3FREE, THYROIDAB in the last 72 hours.  Invalid input(s): FREET3 Anemia Panel: No results for input(s): VITAMINB12, FOLATE, FERRITIN, TIBC, IRON, RETICCTPCT in the last 72 hours. Coag Panel:   Lab Results  Component Value Date   INR 1.09 04/12/2015   INR 1.31 05/10/2012   INR 1.57* 05/09/2012    RADIOLOGY: Dg Chest Port 1 View  04/12/2015   CLINICAL DATA:  Current history of systolic CHF, hypertension and diabetes. Followup pulmonary edema and effusions.  EXAM: PORTABLE CHEST - 1 VIEW  COMPARISON:  04/11/2015 and earlier.  FINDINGS: Suboptimal inspiration. Cardiac silhouette mildly enlarged. From pulmonary venous hypertension and mild interstitial pulmonary edema, unchanged. Large bilateral pleural effusions and associated dense consolidation in the lower lobes, unchanged. No new pulmonary parenchymal abnormalities.  IMPRESSION: 1. Stable mild CHF and/or fluid overload with mild interstitial pulmonary edema. 2. Stable moderately large bilateral pleural effusions and associated dense passive atelectasis in the lower lobes. 3. No new abnormalities.   Electronically Signed  By: Evangeline Dakin M.D.   On: 04/12/2015 09:26   Dg Chest Port 1 View  04/11/2015   CLINICAL DATA:  CHF  EXAM: PORTABLE CHEST - 1 VIEW  COMPARISON:  04/10/2015  FINDINGS: Cardiac shadow is stable and mildly enlarged. Bilateral pleural effusions and bibasilar airspace opacities are seen stable from the prior exam. The overall inspiratory effort has improved however. No bony abnormality is noted.  IMPRESSION: Stable bibasilar changes.   Electronically Signed   By: Inez Catalina M.D.   On: 04/11/2015 13:38   Dg Chest Port 1 View  04/10/2015   CLINICAL DATA:  Shortness of breath for 3 days. Cough for 3 days. Former smoker.  EXAM: PORTABLE CHEST - 1 VIEW   COMPARISON:  05/10/2012  FINDINGS: The cardiac silhouette is obscured. Extremely diminished lung volumes with increased density at both lung bases and crowding/ accentuation of the bronchovascular markings in the perihilar and apical regions. No pneumothorax. No acute osseous abnormality is identified.  IMPRESSION: Low lung volumes with increased density in the lung bases, which may represent a combination of atelectasis or consolidation and pleural effusions. Dedicated two-view chest radiographs recommended when clinically able.   Electronically Signed   By: Logan Bores   On: 04/10/2015 09:16      ASSESSMENT: Kathyrn Lass:  Cardiomyopathy: Plan for cath today.  I had her lie flat for several minutes and she had no SHOB.  All questions answered.  Cardiomyopathy.  Was on ARB prior to admission.  Will restart after cath and renal function verified to be stable.    Would not perform Vgram due to recent renal insufficiency.  Jettie Booze, MD  04/13/2015  9:13 AM

## 2015-04-13 NOTE — Interval H&P Note (Signed)
Cath Lab Visit (complete for each Cath Lab visit)  Clinical Evaluation Leading to the Procedure:   ACS: No.  Non-ACS:    Anginal Classification: CCS III  Anti-ischemic medical therapy: No Therapy  Non-Invasive Test Results: No non-invasive testing performed  Prior CABG: No previous CABG      History and Physical Interval Note:  04/13/2015 2:33 PM  Jenna Brown  has presented today for surgery, with the diagnosis of NSTEMI  The various methods of treatment have been discussed with the patient and family. After consideration of risks, benefits and other options for treatment, the patient has consented to  Procedure(s): Right/Left Heart Cath and Coronary Angiography (N/A) as a surgical intervention .  The patient's history has been reviewed, patient examined, no change in status, stable for surgery.  I have reviewed the patient's chart and labs.  Questions were answered to the patient's satisfaction.     Quay Burow

## 2015-04-13 NOTE — Progress Notes (Signed)
Site area: lt groin fa and fv sheath Site Prior to Removal:  Level 0 Pressure Applied For:  20 minutes Manual:   yes Patient Status During Pull:  stable Post Pull Site:  Level  0 Post Pull Instructions Given:  yes Post Pull Pulses Present: yes Dressing Applied:  tegaderm Bedrest begins @ [id number] Comments:  0

## 2015-04-13 NOTE — Progress Notes (Signed)
[place] TEAM 1 - Stepdown/ICU TEAM Progress Note  Jenna Brown [id number] DOB: February 24, 1951 DOA: 04/10/2015 PCP: [name], MD  Admit HPI / Brief Narrative: 63-year-[name], HLD, HTN, CAD Diabetes Type 2 uncontrolled, CVA, Rt AKA   Presented from home with acute shortness of breath. History was obtained from the patient who reported that she has been having worsening shortness of breath for last 3 days. She has noticed worsening orthopnea, PND, mostly sitting up with 4 pillows, has noticed weight gain of 6 lbs (unable to tell me the duration), swelling in her left leg. However this morning, patient had difficult time catching her breath and EMS was called. Per EMS, O2 sats were 85% on room air and she was placed on NRB mask. She denied any fevers, chills, productive cough or any wheezing. She did have discomfort in her left upper abdomen at the time of triage however denies any abdominal pain at this time. ER workup/EDP recommendations reviewed, patient was placed on BiPAP in ED. She was evaluated by cardiology in ED, Dr Acie Fredrickson recommended pulmonary critical care evaluation.  ER workup showed sodium 137, BUN 26, creatinine 1.49, glucose 493, BNP 395.3, troponin 0.15 WBCs 11.5, hemoglobin 10.9 Elevated d-dimer of 0.85   HPI/Subjective: 7/25 patient at cardiac catheterization lab  Assessment/Plan: Acute respiratory failure with hypoxia:  -multifactorial to include fluid overload, NSTEMI, and HCAP? -DC Lasix, cardiology slowly hydrating in preparation for cardiac cath in the a.m. -Strict in and out since admission -1.82L -Daily a.m. Admission Weight= 72.5 kg            7/24 weight= 74 kg -Continue current antibiotics regimen for now -Xopenex nebulizer TID -Titrate O2 to maintain SPO2 89-93% -BiPAP PRN  Elevated d-dimer  -Most likely secondary to her renal dysfunction and NSTEMI, unfortunately cannot R/O PE  -patient currently unable to have CT angiogram or  VQ scan.  -Patient currently on heparin drip secondary for NSTEMI therefore would not change treatment plan. However once more stable would attempt to obtain VQ scan, would need to be on long-term anticoagulation.  NSTEMI  -Echocardiogram; LVEF= 20% see results below  -Cardiac catheterization scheduled for [id number]   Acute Systolic CHF   -See acute respiratory failure -Increase Coreg 6.25 mg BID -DC Lasix 60 mg BID in preparation for cardiac catheterization  Hypomagnesemia -Magnesium goal> 2 -Magnesium IV 2 gm 1  Hypokalemia -Potassium goal> 4   Hypertension -See acute systolic CHF   Peripheral vascular disease  - continue aspirin 81 mg daily  Hyperlipidemia -Obtain lipid panel -Changed to Crestor 20 mg daily   DM Type II (diabetes mellitus, type 2) uncontrolled - 7/22 hemoglobin A1c= 11.3 -Lipid panel ordered -Increase Lantus to 22 units daily -Increase resistant SSI -Increase NovoLog 10 units QAC  Acute renal failure ( Unknown baseline)  -Cr improving with diuresis -Hold all nephrotoxic medication -Hold metformin, HCTZ, ibuprofen, losartan  - Monitor creatinine    Code Status: FULL Family Communication: Sister present at time of exam Disposition Plan:  Per cardiology    Consultants: Dr. Wonda Cheng Nahser (cardiology) [facility]   Procedure/Significant Events: 7/23 echocardiogram;- LVEF=20%. The entire mid-ventricle and apex are akinetic.(could be a Takotsubo ventricle).   Culture 7/22 urine NGTD 7/22 blood left forearm/antecubital NGTD   Antibiotics:   DVT prophylaxis: Heparin drip   Devices    LINES / TUBES:      Continuous Infusions: . sodium chloride 50 mL/hr (04/13/15 1314)  . heparin 1,200 Units/hr (04/13/15  0900)    Objective: VITAL SIGNS: Temp: 98.3 F (36.8 C) (07/25 1100) Temp Source: Oral (07/25 1100) BP: 86/56 mmHg (07/25 1100) Pulse Rate: 93 (07/25 1200) SPO2; FIO2:   Intake/Output Summary (Last  24 hours) at 04/13/15 1439 Last data filed at 04/13/15 1358  Gross per 24 hour  Intake 1751.55 ml  Output   2500 ml  Net -748.45 ml     Exam: General: Patient at cardiac catheterization Eyes:  Patient at cardiac catheterization ENT:  Patient at cardiac catheterization Neck:  Patient at cardiac catheterization Lungs:  Patient at cardiac catheterization Cardiovascular:  Patient at cardiac catheterization Abdomen; Patient at cardiac catheterization Extremities:  Patient at cardiac catheterization Psychiatric:   Patient at cardiac catheterization Neurologic:   Patient at cardiac catheterization     Data Reviewed: Basic Metabolic Panel:  Recent Labs Lab 04/10/15 0850 04/10/15 1824 04/11/15 0230 04/12/15 0450 04/12/15 1140 04/13/15 0225  NA 137  --  138 135 134* 135  K 4.6  --  4.1 4.4 4.3 3.7  CL 102  --  106 101 101 101  CO2 20*  --  22 23 22 23   GLUCOSE 493* 241* 213* 189* 320* 194*  BUN 26*  --  30* 30* 28* 28*  CREATININE 1.49* 1.51* 1.42* 1.41* 1.25* 1.27*  CALCIUM 9.1  --  8.8* 8.5* 8.5* 8.3*  MG  --   --   --  1.6*  --  1.4*   Liver Function Tests: No results for input(s): AST, ALT, ALKPHOS, BILITOT, PROT, ALBUMIN in the last 168 hours. No results for input(s): LIPASE, AMYLASE in the last 168 hours. No results for input(s): AMMONIA in the last 168 hours. CBC:  Recent Labs Lab 04/10/15 1824 04/11/15 0230 04/12/15 0450 04/12/15 1140 04/13/15 0225  WBC 10.3 9.4 9.1 8.9 9.1  HGB 10.7* 9.8* 9.9* 10.1* 9.9*  HCT 33.4* 30.9* 31.2* 31.4* 30.9*  MCV 79.5 79.6 79.2 79.7 79.6  PLT 393 339 380 367 340   Cardiac Enzymes:  Recent Labs Lab 04/10/15 1227 04/10/15 1824 04/10/15 2346 04/12/15 0943  TROPONINI 0.30* 0.50* 0.67* 0.27*   BNP (last 3 results)  Recent Labs  04/10/15 0850  BNP 395.3*    ProBNP (last 3 results) No results for input(s): PROBNP in the last 8760 hours.  CBG:  Recent Labs Lab 04/12/15 2016 04/12/15 2341 04/13/15 0421  04/13/15 0733 04/13/15 1144  GLUCAP 313* 89 206* 202* 183*    Recent Results (from the past 240 hour(s))  Urine culture     Status: None   Collection Time: 04/10/15 12:48 PM  Result Value Ref Range Status   Specimen Description URINE, RANDOM  Final   Special Requests NONE  Final   Culture NO GROWTH 1 DAY  Final   Report Status 04/11/2015 FINAL  Final  Culture, blood (routine x 2)     Status: None (Preliminary result)   Collection Time: 04/10/15  3:15 PM  Result Value Ref Range Status   Specimen Description BLOOD LEFT FOREARM  Final   Special Requests BOTTLES DRAWN AEROBIC ONLY 5CC  Final   Culture NO GROWTH 3 DAYS  Final   Report Status PENDING  Incomplete  Culture, blood (routine x 2)     Status: None (Preliminary result)   Collection Time: 04/10/15  3:25 PM  Result Value Ref Range Status   Specimen Description BLOOD LEFT ANTECUBITAL  Final   Special Requests BOTTLES DRAWN AEROBIC AND ANAEROBIC 5CC 5CC  Final   Culture NO  GROWTH 3 DAYS  Final   Report Status PENDING  Incomplete  MRSA PCR Screening     Status: None   Collection Time: 04/13/15 10:58 AM  Result Value Ref Range Status   MRSA by PCR NEGATIVE NEGATIVE Final    Comment:        The GeneXpert MRSA Assay (FDA approved for NASAL specimens only), is one component of a comprehensive MRSA colonization surveillance program. It is not intended to diagnose MRSA infection nor to guide or monitor treatment for MRSA infections.      Studies:  Recent x-ray studies have been reviewed in detail by the Attending Physician  Scheduled Meds:  Scheduled Meds: . antiseptic oral rinse  7 mL Mouth Rinse q12n4p  . aspirin  81 mg Oral Daily  . azithromycin  500 mg Intravenous Q24H  . carvedilol  6.25 mg Oral BID WC  . cefTRIAXone (ROCEPHIN)  IV  1 g Intravenous Q24H  . chlorhexidine  15 mL Mouth Rinse BID  . insulin aspart  0-15 Units Subcutaneous 6 times per day  . insulin aspart  8 Units Subcutaneous TID WC  . insulin  glargine  17 Units Subcutaneous QHS  . levalbuterol  1.25 mg Nebulization TID  . nitroGLYCERIN  1 inch Topical 4 times per day  . rosuvastatin  20 mg Oral q1800  . sodium chloride  3 mL Intravenous Q12H  . sodium chloride  3 mL Intravenous Q12H  . vitamin C  500 mg Oral Daily  . zinc sulfate  220 mg Oral Daily    Time spent on care of this patient: 40 mins   WOODS, Geraldo Docker , MD  [facility] Office  [phone] Pager [phone]  On-Call/Text Page:      [email or web address]      password [email or web address]  If 7PM-7AM, please contact night-coverage [email or web address] Password [email or web address] 04/13/2015, 2:39 PM   LOS: 3 days   Care during the described time interval was provided by me .  I have reviewed this patient's available data, including medical history, events of note, physical examination, and all test results as part of my evaluation. I have personally reviewed and interpreted all radiology studies.   Dia Crawford, MD [phone] Pager

## 2015-04-13 NOTE — Progress Notes (Signed)
SUBJECTIVE:  Feels ok.  Prefers to sit up but is not [facility] wen she is lying flat.   OBJECTIVE:   Vitals:   Filed Vitals:   04/13/15 0400 04/13/15 0419 04/13/15 0734 04/13/15 0801  BP: 138/71 138/71 152/93   Pulse: 101 101 110   Temp:  98.3 F (36.8 C) 98.3 F (36.8 C)   TempSrc:  Oral Oral   Resp: 23 24 20    Height:      Weight:  160 lb 15 oz (73 kg)    SpO2: 96% 93% 96% 100%   I&O's:   Intake/Output Summary (Last 24 hours) at 04/13/15 0913 Last data filed at 04/13/15 [id number]  Gross per 24 hour  Intake 1486.05 ml  Output   1800 ml  Net -313.95 ml   TELEMETRY: Reviewed telemetry pt in Sinus tach:     PHYSICAL EXAM General: Well developed, well nourished, in no acute distress Head:   Normal cephalic and atramatic  Lungs:   Clear bilaterally to auscultation. Heart:   Tachycardic, regular S1 S2  No JVD.   Abdomen: abdomen soft and non-tender Msk:  Back normal,  Normal strength and tone for age. Extremities:   No edema.   Neuro: Alert and oriented. Psych:  Normal affect, responds appropriately Skin: No rash   LABS: Basic Metabolic Panel:  Recent Labs  04/12/15 0450 04/12/15 1140 04/13/15 0225  NA 135 134* 135  K 4.4 4.3 3.7  CL 101 101 101  CO2 23 22 23   GLUCOSE 189* 320* 194*  BUN 30* 28* 28*  CREATININE 1.41* 1.25* 1.27*  CALCIUM 8.5* 8.5* 8.3*  MG 1.6*  --  1.4*   Liver Function Tests: No results for input(s): AST, ALT, ALKPHOS, BILITOT, PROT, ALBUMIN in the last 72 hours. No results for input(s): LIPASE, AMYLASE in the last 72 hours. CBC:  Recent Labs  04/12/15 1140 04/13/15 0225  WBC 8.9 9.1  HGB 10.1* 9.9*  HCT 31.4* 30.9*  MCV 79.7 79.6  PLT 367 340   Cardiac Enzymes:  Recent Labs  04/10/15 1824 04/10/15 2346 04/12/15 0943  TROPONINI 0.50* 0.67* 0.27*   BNP: Invalid input(s): POCBNP D-Dimer: No results for input(s): DDIMER in the last 72 hours. Hemoglobin A1C:  Recent Labs  04/10/15 1515  HGBA1C 11.3*   Fasting Lipid  Panel:  Recent Labs  04/12/15 0450  CHOL 216*  HDL 35*  LDLCALC 153*  TRIG 142  CHOLHDL 6.2   Thyroid Function Tests: No results for input(s): TSH, T4TOTAL, T3FREE, THYROIDAB in the last 72 hours.  Invalid input(s): FREET3 Anemia Panel: No results for input(s): VITAMINB12, FOLATE, FERRITIN, TIBC, IRON, RETICCTPCT in the last 72 hours. Coag Panel:   Lab Results  Component Value Date   INR 1.09 04/12/2015   INR 1.31 05/10/2012   INR 1.57* 05/09/2012    RADIOLOGY: Dg Chest Port 1 View  04/12/2015   CLINICAL DATA:  Current history of systolic CHF, hypertension and diabetes. Followup pulmonary edema and effusions.  EXAM: PORTABLE CHEST - 1 VIEW  COMPARISON:  04/11/2015 and earlier.  FINDINGS: Suboptimal inspiration. Cardiac silhouette mildly enlarged. From pulmonary venous hypertension and mild interstitial pulmonary edema, unchanged. Large bilateral pleural effusions and associated dense consolidation in the lower lobes, unchanged. No new pulmonary parenchymal abnormalities.  IMPRESSION: 1. Stable mild CHF and/or fluid overload with mild interstitial pulmonary edema. 2. Stable moderately large bilateral pleural effusions and associated dense passive atelectasis in the lower lobes. 3. No new abnormalities.   Electronically Signed  By: Evangeline Dakin M.D.   On: 04/12/2015 09:26   Dg Chest Port 1 View  04/11/2015   CLINICAL DATA:  CHF  EXAM: PORTABLE CHEST - 1 VIEW  COMPARISON:  04/10/2015  FINDINGS: Cardiac shadow is stable and mildly enlarged. Bilateral pleural effusions and bibasilar airspace opacities are seen stable from the prior exam. The overall inspiratory effort has improved however. No bony abnormality is noted.  IMPRESSION: Stable bibasilar changes.   Electronically Signed   By: Inez Catalina M.D.   On: 04/11/2015 13:38   Dg Chest Port 1 View  04/10/2015   CLINICAL DATA:  Shortness of breath for 3 days. Cough for 3 days. Former smoker.  EXAM: PORTABLE CHEST - 1 VIEW   COMPARISON:  05/10/2012  FINDINGS: The cardiac silhouette is obscured. Extremely diminished lung volumes with increased density at both lung bases and crowding/ accentuation of the bronchovascular markings in the perihilar and apical regions. No pneumothorax. No acute osseous abnormality is identified.  IMPRESSION: Low lung volumes with increased density in the lung bases, which may represent a combination of atelectasis or consolidation and pleural effusions. Dedicated two-view chest radiographs recommended when clinically able.   Electronically Signed   By: Logan Bores   On: 04/10/2015 09:16      ASSESSMENT: Kathyrn Lass:  Cardiomyopathy: Plan for cath today.  I had her lie flat for several minutes and she had no SHOB.  All questions answered.  Cardiomyopathy.  Was on ARB prior to admission.  Will restart after cath and renal function verified to be stable.    Would not perform Vgram due to recent renal insufficiency.  Jettie Booze, MD  04/13/2015  9:13 AM

## 2015-04-13 NOTE — Progress Notes (Signed)
Utilization Review Completed.Donne Anon T7/25/2016

## 2015-04-14 ENCOUNTER — Encounter (HOSPITAL_COMMUNITY): Payer: Self-pay | Admitting: Cardiovascular Disease

## 2015-04-14 DIAGNOSIS — R748 Abnormal levels of other serum enzymes: Secondary | ICD-10-CM

## 2015-04-14 DIAGNOSIS — I2510 Atherosclerotic heart disease of native coronary artery without angina pectoris: Secondary | ICD-10-CM

## 2015-04-14 DIAGNOSIS — E1165 Type 2 diabetes mellitus with hyperglycemia: Secondary | ICD-10-CM

## 2015-04-14 LAB — GLUCOSE, CAPILLARY
GLUCOSE-CAPILLARY: 142 mg/dL — AB (ref 65–99)
GLUCOSE-CAPILLARY: 148 mg/dL — AB (ref 65–99)
GLUCOSE-CAPILLARY: 162 mg/dL — AB (ref 65–99)
GLUCOSE-CAPILLARY: 82 mg/dL (ref 65–99)
Glucose-Capillary: 145 mg/dL — ABNORMAL HIGH (ref 65–99)
Glucose-Capillary: 167 mg/dL — ABNORMAL HIGH (ref 65–99)
Glucose-Capillary: 192 mg/dL — ABNORMAL HIGH (ref 65–99)
Glucose-Capillary: 230 mg/dL — ABNORMAL HIGH (ref 65–99)

## 2015-04-14 LAB — BASIC METABOLIC PANEL
Anion gap: 10 (ref 5–15)
BUN: 23 mg/dL — ABNORMAL HIGH (ref 6–20)
CO2: 23 mmol/L (ref 22–32)
CREATININE: 1.1 mg/dL — AB (ref 0.44–1.00)
Calcium: 8.8 mg/dL — ABNORMAL LOW (ref 8.9–10.3)
Chloride: 104 mmol/L (ref 101–111)
GFR calc Af Amer: >60 mL/min (ref >60)
GFR calc non Af Amer: 53 mL/min — ABNORMAL LOW (ref >60)
Glucose, Bld: 162 mg/dL — ABNORMAL HIGH (ref 65–99)
Potassium: 4 mmol/L (ref 3.5–5.1)
Sodium: 137 mmol/L (ref 135–145)

## 2015-04-14 LAB — PROCALCITONIN

## 2015-04-14 LAB — MAGNESIUM: Magnesium: 2 mg/dL (ref 1.7–2.4)

## 2015-04-14 MED ORDER — INSULIN GLARGINE 100 UNIT/ML ~~LOC~~ SOLN
25.0000 [IU] | Freq: Every day | SUBCUTANEOUS | Status: DC
  Administered 2015-04-14 – 2015-04-15 (×2): 25 [IU] via SUBCUTANEOUS
  Filled 2015-04-14 (×3): qty 0.25

## 2015-04-14 MED ORDER — INSULIN ASPART 100 UNIT/ML ~~LOC~~ SOLN
12.0000 [IU] | Freq: Three times a day (TID) | SUBCUTANEOUS | Status: DC
  Administered 2015-04-14 – 2015-04-17 (×12): 12 [IU] via SUBCUTANEOUS

## 2015-04-14 MED ORDER — FUROSEMIDE 10 MG/ML IJ SOLN
60.0000 mg | Freq: Two times a day (BID) | INTRAMUSCULAR | Status: DC
  Administered 2015-04-14 – 2015-04-16 (×5): 60 mg via INTRAVENOUS
  Filled 2015-04-14 (×7): qty 6

## 2015-04-14 MED ORDER — HYDRALAZINE HCL 10 MG PO TABS
10.0000 mg | ORAL_TABLET | Freq: Three times a day (TID) | ORAL | Status: DC
  Administered 2015-04-14 – 2015-04-16 (×9): 10 mg via ORAL
  Filled 2015-04-14 (×12): qty 1

## 2015-04-14 MED ORDER — ISOSORBIDE DINITRATE 10 MG PO TABS
10.0000 mg | ORAL_TABLET | Freq: Three times a day (TID) | ORAL | Status: DC
  Administered 2015-04-14 – 2015-04-16 (×9): 10 mg via ORAL
  Filled 2015-04-14 (×12): qty 1

## 2015-04-14 MED ORDER — POTASSIUM CHLORIDE CRYS ER 20 MEQ PO TBCR
40.0000 meq | EXTENDED_RELEASE_TABLET | Freq: Every day | ORAL | Status: DC
  Administered 2015-04-14 – 2015-04-16 (×3): 40 meq via ORAL
  Filled 2015-04-14 (×4): qty 2

## 2015-04-14 MED ORDER — AZITHROMYCIN 500 MG PO TABS
500.0000 mg | ORAL_TABLET | ORAL | Status: AC
  Administered 2015-04-14 – 2015-04-16 (×3): 500 mg via ORAL
  Filled 2015-04-14 (×4): qty 1

## 2015-04-14 NOTE — Progress Notes (Signed)
Patient Name: Jenna Brown Date of Encounter: 04/14/2015  Principal Problem:   Acute respiratory failure with hypoxia Active Problems:   Hypertension   Hyperlipidemia   Peripheral vascular disease   DMII (diabetes mellitus, type 2)   Tachycardia   CHF (congestive heart failure)   Acute respiratory failure   Acute kidney injury   Acute respiratory failure with hypoxemia   Acute pulmonary edema   Acute exacerbation of CHF (congestive heart failure)   Hypoxia   Atelectasis   Elevated d-dimer   NSTEMI (non-ST elevated myocardial infarction)   Acute systolic CHF (congestive heart failure)   Essential hypertension   HLD (hyperlipidemia)   Metabolic acidosis   Diabetes type 2, uncontrolled   Acute renal failure syndrome   Primary Cardiologist: Dr Acie Fredrickson  Patient Profile: 64 yo female w/ hx PVD, HL, TIA, HTN, CKD, admitted 07/22 for SOB. Cards following for NSTEMI. EF 20% by echo. Cath 07/25 w/ multi-vessel dz, TCTS consult but not optimal surgical candidate.  SUBJECTIVE: Breathing a little better today, no chest pain. Quit tob a few years ago, not on O2 at home.  OBJECTIVE Filed Vitals:   04/14/15 0604 04/14/15 0700 04/14/15 0737 04/14/15 0738  BP: 132/75 131/75    Pulse: 102 99    Temp:   98.6 F (37 C)   TempSrc:   Oral   Resp: 23 25    Height:      Weight:      SpO2: 97% 98%  97%    Intake/Output Summary (Last 24 hours) at 04/14/15 0842 Last data filed at 04/14/15 0742  Gross per 24 hour  Intake 1314.17 ml  Output   1600 ml  Net -285.83 ml   Filed Weights   04/12/15 0500 04/13/15 0419 04/14/15 0400  Weight: 163 lb 2.3 oz (74 kg) 160 lb 15 oz (73 kg) 162 lb 11.2 oz (73.8 kg)    PHYSICAL EXAM General: Well developed, well nourished, female in no acute distress. Head: Normocephalic, atraumatic.  Neck: Supple without bruits, JVD 10 cm. Lungs:  Resp regular and unlabored, rales w/ decreased BS bases. Heart: RRR, S1, S2, no S3, S4, or murmur; no  rub. Abdomen: Soft, non-tender, non-distended, BS + x 4.  Extremities: No clubbing, cyanosis, trace L edema. S/p RLE amputation. L groin cath site w/out ecchymosis, hematoma or bruit. Neuro: Alert and oriented X 3. Moves all extremities spontaneously. Psych: Normal affect.  LABS: CBC: Recent Labs  04/12/15 1140 04/13/15 0225  WBC 8.9 9.1  HGB 10.1* 9.9*  HCT 31.4* 30.9*  MCV 79.7 79.6  PLT 367 340   INR: Recent Labs  04/12/15 1140  INR [id number]   Basic Metabolic Panel: Recent Labs  04/13/15 0225 04/14/15 0600  NA 135 137  K 3.7 4.0  CL 101 104  CO2 23 23  GLUCOSE 194* 162*  BUN 28* 23*  CREATININE 1.27* 1.10*  CALCIUM 8.3* 8.8*  MG 1.4* 2.0   Cardiac Enzymes: Recent Labs  04/12/15 0943  TROPONINI 0.27*   BNP:  B NATRIURETIC PEPTIDE  Date/Time Value Ref Range Status  04/10/2015 08:50 AM 395.3* 0.0 - 100.0 pg/mL Final   Fasting Lipid Panel: Recent Labs  04/12/15 0450  CHOL 216*  HDL 35*  LDLCALC 153*  TRIG 142  CHOLHDL 6.2   TELE: SR, bigeminy at times     Cath: 04/13/2015  Prox LAD to Mid LAD lesion, 90% stenosed.  Ost Cx to Mid Cx lesion, 95% stenosed.  Colon Flattery  2nd Mrg to 2nd Mrg lesion, 80% stenosed.  Mid Cx lesion, 90% stenosed.  Prox RCA lesion, 60% stenosed.  Mid RCA lesion, 50% stenosed. IMPRESSION:Ms. Delles has three-vessel disease and severe LV dysfunction with elevated filling pressures. Her wedge is elevated as well as is her LVEDP. We will continue to diurese her and optimize her physiology medically. She may benefit from being on intravenous inotropic therapy. We will get the cardiac thoracic surgeons to consult although my initial clinical impression is that she is not a good surgical candidate. She may require percutaneous intervention of her LAD and circumflex coronary arteries. It should be noted that the patient was hypoxic during the procedure with sats in the 90 range on 6 L. Her PA sat was 32 mmHg. She was dyspneic lying on the  Table at 30-45. The sheaths will be removed and pressure held. I discussed results of her case with Dr. Irish Lack , the rounding cardiologist in the intensive care unit.   Radiology/Studies: No results found.   Current Medications:  . aspirin  81 mg Oral Daily  . azithromycin  500 mg Intravenous Q24H  . carvedilol  6.25 mg Oral BID WC  . cefTRIAXone (ROCEPHIN)  IV  1 g Intravenous Q24H  . chlorhexidine  15 mL Mouth Rinse BID  . insulin aspart  0-20 Units Subcutaneous 6 times per day  . insulin aspart  12 Units Subcutaneous TID WC  . insulin glargine  25 Units Subcutaneous QHS  . levalbuterol  1.25 mg Nebulization TID  . nitroGLYCERIN  1 inch Topical 4 times per day  . rosuvastatin  20 mg Oral q1800  . sodium chloride  3 mL Intravenous Q12H  . sodium chloride  3 mL Intravenous Q12H      ASSESSMENT AND PLAN:   NSTEMI (non-ST elevated myocardial infarction) - multi-vessel dz at cath 07/25, results above - TCTS to see but may need PCI - optimal medical therapy w/ ASA, BB, statin - change nitrates to isordil 10 mg tid and add hydralazine - if no surgery, possible PCI later this week, would optimize resp status more first    Acute systolic CHF (congestive heart failure) - needs more diuresis, was on Lasix 60 mg bid, restart this now. - add hydralazine to the nitrates - is on BB - continue to supp K+ - may need milrinone.  Otherwise, per IM Principal Problem:   Acute respiratory failure with hypoxia Active Problems:   Hypertension   Hyperlipidemia   Peripheral vascular disease   DMII (diabetes mellitus, type 2)   Tachycardia   CHF (congestive heart failure)   Acute respiratory failure   Acute kidney injury   Acute respiratory failure with hypoxemia   Acute pulmonary edema   Acute exacerbation of CHF (congestive heart failure)   Hypoxia   Atelectasis   Elevated d-dimer   Essential hypertension   HLD (hyperlipidemia)   Metabolic acidosis   Diabetes type 2,  uncontrolled   Acute renal failure syndrome   Signed, Rosaria Ferries , PA-C 8:42 AM 04/14/2015  I have examined the patient and reviewed assessment and plan and discussed with patient.  Agree with above as stated.  I personally reviewed the cath films.  Her mid LAd is occluded and there are right to left collaterals.  Her vessels are small and diffusely diseased.  Any type of revasc would be difficult.  Her prox right and prox circ would be amenable to PCI.  THe LAd occlusion is at a large diag  bifurcation.  Continue diuresis attempts.  Renal function better today.  Will continue to monitor.   I spoke at length with Ivin Booty, the patient's family member to update.    Marcelle Hepner S.

## 2015-04-14 NOTE — Consult Note (Signed)
[name]       [name] [id number]             [id number]        Jenna Brown [facility] Medical Record [id number] Date of Birth: 23-Aug-1951  Referring: No ref. provider found Primary Care: [name], MD  Chief Complaint:    Chief Complaint  Patient presents with  . Respiratory Distress   patient examined, echocardiogram and coronary angiogram independently reviewed   History of Present Illness:     64 year old AA  obese female diabetic  presents with class IV CHF, pulmonary edema, and ischemic cardiomyopathy with severe multivessel CAD and EF 20% with elevated filling pressures and pulmonary capillary wedge pressure greater than 30 mmHg.    the patient has history of peripheral vascular disease, status post right femoral-tibial bypass and subsequent right AKA in 2013. She stopped smoking at that time. She was admitted emergency department with severe shortness of breath was found to have pulmonary edema and mildly elevated cardiac enzymes. Left and right heart catheterization was performed by Dr. [name]. This demonstrated severe coronary disease of the LAD and circumflex systems, poor targets for grafting. Cardiac index was 1.75 with mixed venous saturation only 32%. Echocardiogram shows akinesis of the anterior apical LV without significant MR or TR. RV function is fairly well preserved.  Patient still has orthopnea in the hospital. An opinion regarding surgical coronary revascularization was requested.   Current Activity/ Functional Status: Patient ambulate some with a prosthesis but is minimally active    Zubrod Score: At the time of surgery this patient's most appropriate activity status/level should be described as: []     0    Normal activity, no symptoms []     1    Restricted in physical strenuous activity but ambulatory, able to do out light work []     2    Ambulatory and capable of self care, unable to do work activities, up and about                  more than 50%  Of the time                            [x]     3    Only limited self care, in bed greater than 50% of waking hours []     4    Completely disabled, no self care, confined to bed or chair []     5    Moribund  Past Medical History  Diagnosis Date  . Hyperlipidemia     takes Simvastatin daily  . Peripheral vascular disease   . Stroke     TIA history  . PONV (postoperative nausea and vomiting)   . Hypertension     takes Amlodipine/HCTZ/Losartan daily  . Vertigo     takes Ativert prn  . Hemorrhoids   . Anxiety     takes Xanax prn  . Myocardial infarction 1990's  . Type II diabetes mellitus     takes Glucovance and Januvia daily  . H/O hiatal hernia   . GERD (gastroesophageal reflux disease)   . Headache(784.0)     when b/p is elevated  . Migraines   . Arthritis     Past Surgical History  Procedure Laterality Date  . Cleft palate repair      several  . Pr vein bypass graft,aorto-fem-pop  05/27/11    Right  SFA-Below knee Pop BP  . Tonsillectomy    . Dilation and curettage of uterus    . Aortogram  02/2012  . Colonoscopy    . Femoral-tibial bypass graft  03/29/2012    Procedure: BYPASS GRAFT FEMORAL-TIBIAL ARTERY;  Surgeon: [name], MD;  Location: [facility] OR;  Service: Vascular;  Laterality: Right;  Right femoral to Posterior Tibialis with composite graft of 81mm x 80 cm ringed gortex graft and saphenous vein  ,intraoperative arteriogram  . Femoral-popliteal bypass graft  04/05/2012    Procedure: BYPASS GRAFT FEMORAL-POPLITEAL ARTERY;  Surgeon: [name], MD;  Location: [facility];  Service: Vascular;  Laterality: Right;  REVISION  . Myomectomy    . Tubal ligation  ~ 1990  . Femoropopliteal thrombectomy / embolectomy  05/09/2012  . Amputation  05/10/2012    Procedure: AMPUTATION ABOVE KNEE;  Surgeon: [name], MD;  [facility] OR;  Service: Vascular;  Laterality: Right;   open right groin wound noted  . Abdominal aortagram N/A 02/29/2012     Procedure: ABDOMINAL Maxcine Ham;  Surgeon: [name], MD;  Location: [facility] CATH LAB;  Service: Cardiovascular;  Laterality: N/A;  . Cardiac catheterization N/A 04/13/2015    Procedure: Right/Left Heart Cath and Coronary Angiography;  Surgeon: [name], MD;  Location: [facility] CV LAB;  Service: Cardiovascular;  Laterality: N/A;    History  Smoking status  . Former Smoker -- 0.25 packs/day for 40 years  . Types: Cigarettes  . Quit date: 02/27/2012  Smokeless tobacco  . Never Used    History  Alcohol Use No    History   Social History  . Marital Status: Single    Spouse Name: N/A  . Number of Children: N/A  . Years of Education: N/A   Occupational History  . Not on file.   Social History Main Topics  . Smoking status: Former Smoker -- 0.25 packs/day for 40 years    Types: Cigarettes    Quit date: 02/27/2012  . Smokeless tobacco: Never Used  . Alcohol Use: No  . Drug Use: No  . Sexual Activity: No   Other Topics Concern  . Not on file   Social History Narrative    Allergies  Allergen Reactions  . Plavix [Clopidogrel Bisulfate] Itching  . Codeine Other (See Comments)    "makes me feel strange"  . Tylenol With Codeine #3 [Acetaminophen-Codeine] Other (See Comments)    "doesn't make me feel right"  . Tylenol [Acetaminophen] Other (See Comments)    "Doesn't feel right"     Current Facility-Administered Medications  Medication Dose Route Frequency Provider Last Rate Last Dose  . 0.9 %  sodium chloride infusion  250 mL Intravenous PRN [name], MD   Stopped at 04/13/15 2344  . acetaminophen (TYLENOL) tablet 650 mg  650 mg Oral Q4H PRN [name], MD      . ALPRAZolam Duanne Moron) tablet 0.25 mg  0.25 mg Oral TID PRN Ripudeep Krystal Eaton, MD      . aspirin chewable tablet 81 mg  81 mg Oral Daily Ripudeep Krystal Eaton, MD   81 mg at 04/14/15 0906  . azithromycin (ZITHROMAX) tablet 500 mg  500 mg Oral Q24H Allie Bossier, MD   500 mg at 04/14/15 1300  .  carvedilol (COREG) tablet 6.25 mg  6.25 mg Oral BID WC Thayer Headings, MD   6.25 mg at 04/14/15 1717  . cefTRIAXone (ROCEPHIN) 1 g in dextrose 5 %  50 mL IVPB - Premix  1 g Intravenous Q24H Ripudeep K Rai, MD   1 g at 04/14/15 1300  . chlorhexidine (PERIDEX) 0.12 % solution 15 mL  15 mL Mouth Rinse BID Ripudeep K Rai, MD   15 mL at 04/14/15 0900  . furosemide (LASIX) injection 60 mg  60 mg Intravenous BID Evelene Croon Barrett, PA-C   60 mg at 04/14/15 1717  . hydrALAZINE (APRESOLINE) tablet 10 mg  10 mg Oral TID Evelene Croon Barrett, PA-C   10 mg at 04/14/15 1545  . insulin aspart (novoLOG) injection 0-20 Units  0-20 Units Subcutaneous 6 times per day Allie Bossier, MD   4 Units at 04/14/15 1716  . insulin aspart (novoLOG) injection 12 Units  12 Units Subcutaneous TID WC Allie Bossier, MD   12 Units at 04/14/15 1718  . insulin glargine (LANTUS) injection 25 Units  25 Units Subcutaneous QHS Allie Bossier, MD      . isosorbide dinitrate (ISORDIL) tablet 10 mg  10 mg Oral TID Evelene Croon Barrett, PA-C   10 mg at 04/14/15 1546  . levalbuterol (XOPENEX) nebulizer solution 1.25 mg  1.25 mg Nebulization TID Allie Bossier, MD   1.25 mg at 04/14/15 1355  . meclizine (ANTIVERT) tablet 25 mg  25 mg Oral Daily PRN Ripudeep Krystal Eaton, MD   25 mg at 04/10/15 1542  . ondansetron (ZOFRAN) injection 4 mg  4 mg Intravenous Q6H PRN Ripudeep K Rai, MD      . potassium chloride SA (K-DUR,KLOR-CON) CR tablet 40 mEq  40 mEq Oral Daily Rhonda G Barrett, PA-C   40 mEq at 04/14/15 1021  . rosuvastatin (CRESTOR) tablet 20 mg  20 mg Oral q1800 Thayer Headings, MD   20 mg at 04/14/15 1717  . sodium chloride 0.9 % injection 3 mL  3 mL Intravenous Q12H Ripudeep Krystal Eaton, MD   3 mL at 04/14/15 0909  . sodium chloride 0.9 % injection 3 mL  3 mL Intravenous PRN Ripudeep Krystal Eaton, MD   3 mL at 04/13/15 2224  . sodium chloride 0.9 % injection 3 mL  3 mL Intravenous Q12H [name], MD   3 mL at 04/14/15 0907  . sodium chloride 0.9 % injection 3  mL  3 mL Intravenous PRN [name], MD   3 mL at 04/13/15 2343    Prescriptions prior to admission  Medication Sig Dispense Refill Last Dose  . ALPRAZolam (XANAX) 0.25 MG tablet Take 0.25 mg by mouth 3 (three) times daily as needed for anxiety.    Past Week at Unknown time  . amLODipine (NORVASC) 10 MG tablet Take 10 mg by mouth daily.    04/09/2015 at Unknown time  . aspirin 81 MG chewable tablet Chew 81 mg by mouth daily.   04/09/2015 at Unknown time  . glipiZIDE-metformin (METAGLIP) 5-500 MG per tablet Take 2 tablets by mouth 2 (two) times daily.  0 04/09/2015 at Unknown time  . hydrochlorothiazide (HYDRODIURIL) 25 MG tablet Take 25 mg by mouth daily.    04/09/2015 at Unknown time  . ibuprofen (ADVIL,MOTRIN) 200 MG tablet Take 400 mg by mouth every 6 (six) hours as needed for mild pain or moderate pain.   04/09/2015 at Unknown time  . LIVALO 4 MG TABS Take 4 mg by mouth daily.  0 04/09/2015 at Unknown time  . losartan (COZAAR) 100 MG tablet Take 100 mg by mouth daily.    04/09/2015 at Unknown  time  . meclizine (ANTIVERT) 25 MG tablet Take 25 mg by mouth daily as needed for dizziness.    Past Week at Unknown time  . vitamin C (ASCORBIC ACID) 500 MG tablet Take 500 mg by mouth daily.   04/09/2015 at Unknown time  . zinc sulfate 220 MG capsule Take 220 mg by mouth daily.   04/09/2015 at Unknown time  . atorvastatin (LIPITOR) 20 MG tablet Take 1 tablet (20 mg total) by mouth daily at 6 PM. (Patient not taking: Reported on 04/10/2015) 30 tablet 2 Not Taking at Unknown time    Family History  Problem Relation Age of Onset  . Cancer Mother     breast  . Diabetes Father      Review of Systems:       Cardiac Review of Systems: Y or N  Chest Pain [  no  ]  Resting SOB [ yes   ] Exertional SOB  [ yes  ]  Orthopnea y[ES  ]   Pedal Edema [  yes ]    Palpitations [  no  ] Syncope  [ no ]   Presyncope [ no   ]  General Review of Systems: [Y] = yes [  ]=no Constitional: recent weight change [ yes  weight gain  ]; anorexia [  ]; fatigue [ yes  ]; nausea [  ]; night sweats [  ]; fever [  ]; or chills [  ]                                                               Dental: poor dentition[  ]; Last Dentist visit:  greater than one year   Eye : blurred vision [  ]; diplopia [   ]; vision changes [  ];  Amaurosis fugax[  ]; Resp: cough [  ];  wheezing[  ];  hemoptysis[  ]; shortness of breath[ yes  ]; paroxysmal nocturnal dyspnea[ yes  ]; dyspnea on exertion[ yes  ]; or orthopnea[ yes ];  GI:  gallstones[  ], vomiting[  ];  dysphagia[  ]; melena[  ];  hematochezia [  ]; heartburn[  ];   Hx of  Colonoscopy[  ]; GU: kidney stones [  ]; hematuria[  ];   dysuria [  ];  nocturia[  ];  history of     obstruction [  ]; urinary frequency [  ]             Skin: rash, swelling[  ];, hair loss[  ];  peripheral edema[  ];  or itching[  ]; Musculosketetal: myalgias[  ];  joint swelling[  ];  joint erythema[  ];  joint pain[  ];  back pain[  ]; status post right AKA   Heme/Lymph: bruising[  ];  bleeding[  ];  anemia[  ];  Neuro: TIA[  ];  headaches[  ];  stroke[  ];  vertigo[  ];  seizures[  ];   paresthesias[  ];  difficulty walking[  ];  Psych:depression[  ]; anxiety[  ];  Endocrine: diabetes[ yes ];  thyroid dysfunction[  ];  Immunizations: Flu [  ]; Pneumococcal[  ];  Other: chronic renal insufficiency-BUN 29 creatinine 1.3  Doppler ultrasound of legs negative for DVT Physical Exam: BP 100/64 mmHg  Pulse 98  Temp(Src) 97.7 F (36.5 C) (Oral)  Resp 25  Ht 5\' 1"  (1.549 m)  Wt 162 lb 11.2 oz (73.8 kg)  BMI 30.76 kg/m2  SpO2 96%       Physical Exam  General: Middle-aged AA obese female sitting up in bed in the CCU no acute distress HEENT: Normocephalic pupils equal , dentition adequate Neck: Supple without JVD, adenopathy, or bruit Chest:  breath sounds diminished bilaterally from probable pleural effusion, no rhonchi, no tenderness             or deformity Cardiovascular:  Regular rate and rhythm, no murmur, positive S3 gallop, peripheral pulses not palpable in all extremities, no hematoma in left groin from cardiac cath Abdomen:  Soft, nontender, no palpable mass or organomegaly Extremities: Cool,  no clubbing cyanosis edema or tenderness,              no venous stasis changes of the legs Rectal/GU: Deferred Neuro: Grossly non--focal and symmetrical throughout Skin: Clean and dry without rash or ulceration   Diagnostic Studies & Laboratory data:     Recent Radiology Findings:   No results found.   I have independently reviewed the above radiologic studies.  chest x-ray, coronary angiogram, transthoracic echocardiogram  Recent Lab Findings: Lab Results  Component Value Date   WBC 9.1 04/13/2015   HGB 9.9* 04/13/2015   HCT 30.9* 04/13/2015   PLT 340 04/13/2015   GLUCOSE 162* 04/14/2015   CHOL 216* 04/12/2015   TRIG 142 04/12/2015   HDL 35* 04/12/2015   LDLCALC 153* 04/12/2015   ALT 6 05/09/2012   AST 9 05/09/2012   NA 137 04/14/2015   K 4.0 04/14/2015   CL 104 04/14/2015   CREATININE 1.10* 04/14/2015   BUN 23* 04/14/2015   CO2 23 04/14/2015   TSH 0.761 07/06/2007   INR 1.09 04/12/2015   HGBA1C 11.3* 04/10/2015      Assessment / Plan:     64 year old female status post right AKA for peripheral vascular disease and failed femoral-tibial bypass Now with ischemic cardiomyopathy, evidence of cardiogenic shock by right heart cath, and advanced heart failure.   coronary vessels are poor targets for grafting due to small size and diffuse disease LV function is severely depressed and with poor targets would preclude beneficial response to CABG  Recommend consideration of percutaneous intervention of CAD and consultation with advanced heart failure service for at least short-term inotropic support with milrinone  I discussed the recommendation not to proceed with CABG with this patient and her family and she  understands.      @ME1 @ 04/14/2015 6:17 PM

## 2015-04-14 NOTE — Progress Notes (Signed)
[facility] TEAM 1 - Stepdown/ICU TEAM Progress Note  Jenna Brown [id number] DOB: 1950-12-25 DOA: 04/10/2015 PCP: [name], MD  Admit HPI / Brief Narrative: 63-year-[name], HLD, HTN, CAD Diabetes Type 2 uncontrolled, CVA, Rt AKA   Presented from home with acute shortness of breath. History was obtained from the patient who reported that she has been having worsening shortness of breath for last 3 days. She has noticed worsening orthopnea, PND, mostly sitting up with 4 pillows, has noticed weight gain of 6 lbs (unable to tell me the duration), swelling in her left leg. However this morning, patient had difficult time catching her breath and EMS was called. Per EMS, O2 sats were 85% on room air and she was placed on NRB mask. She denied any fevers, chills, productive cough or any wheezing. She did have discomfort in her left upper abdomen at the time of triage however denies any abdominal pain at this time. ER workup/EDP recommendations reviewed, patient was placed on BiPAP in ED. She was evaluated by cardiology in ED, Dr Acie Fredrickson recommended pulmonary critical care evaluation.  ER workup showed sodium 137, BUN 26, creatinine 1.49, glucose 493, BNP 395.3, troponin 0.15 WBCs 11.5, hemoglobin 10.9 Elevated d-dimer of 0.85   HPI/Subjective: 7/26 A/O 4, positive SOB but states significantly improved. negative CP, negative N/V. States smoked 1/2 PPD 20 years  Assessment/Plan: Acute respiratory failure with hypoxia:  -multifactorial to include fluid overload, NSTEMI, and HCAP? -DC Lasix, cardiology slowly hydrating in preparation for cardiac cath in the a.m. -Strict in and out since admission -1.6L -Daily a.m. Admission Weight= 72.5 kg            7/26 weight= 73.8 kg -Continue current antibiotics regimen for now -Xopenex nebulizer TID -Titrate O2 to maintain SPO2 89-93% -BiPAP PRN  Elevated d-dimer  -Most likely secondary to her renal dysfunction and NSTEMI,  unfortunately cannot R/O PE  -patient currently unable to have CT angiogram or VQ scan.  -Patient currently on heparin drip secondary for NSTEMI therefore would not change treatment plan. However once more stable would attempt to obtain VQ scan, would need to be on long-term anticoagulation.  NSTEMI  -Echocardiogram; LVEF= 20% see results below  -S/P Cardiac catheterization showing three-vessel disease; per cardiology cardiothoracic surgery already consulted await recommendations.   Acute Systolic CHF   -See acute respiratory failure -Increase Coreg 6.25 mg BID -Restart Lasix 60 mg BID; monitor Cr closely   Hypertension -See acute systolic CHF   Peripheral vascular disease  - continue aspirin 81 mg daily  Hyperlipidemia -Obtain lipid panel -Crestor 20 mg daily   DM Type II (diabetes mellitus, type 2) uncontrolled - 7/22 hemoglobin A1c= 11.3 -Lipid panel ordered -Increase Lantus to 25 units daily -Increase to resistant SSI -Increase NovoLog 12 units QAC  Acute renal failure ( Unknown baseline)  -Cr improving with diuresis -Hold all nephrotoxic medication -Hold metformin, HCTZ, ibuprofen, losartan  - Monitor creatinine    Code Status: FULL Family Communication: No family present at time of exam Disposition Plan:  Per cardiothoracic surgery    Consultants: Dr. Thayer Headings (cardiology) Dr.Wesam Kathryne Sharper [facility] M)   Procedure/Significant Events: 7/23 echocardiogram;- LVEF=20%. The entire mid-ventricle and apex are akinetic.(could be a Takotsubo ventricle). 7/25 Rt/Lt heart cardiac catheterization-;Prox LAD to Mid LAD lesion, 90% stenosed. -Ost Cx to Mid Cx lesion, 95% stenosed.-Ost 2nd Mrg to 2nd Mrg lesion, 80% stenosed. -Mid Cx lesion, 90% stenosed.-Prox RCA lesion, 60% stenosed.-Mid RCA lesion, 50% stenosed.  Culture 7/22 urine NGTD 7/22 blood left forearm/antecubital NGTD   Antibiotics:   DVT prophylaxis: Heparin drip   Devices    LINES /  TUBES:      Continuous Infusions:    Objective: VITAL SIGNS: Temp: 98.6 F (37 C) (07/26 0737) Temp Source: Oral (07/26 0737) BP: 131/75 mmHg (07/26 0700) Pulse Rate: 99 (07/26 0700) SPO2; FIO2:   Intake/Output Summary (Last 24 hours) at 04/14/15 0750 Last data filed at 04/14/15 [id number]  Gross per 24 hour  Intake 1364.17 ml  Output   1600 ml  Net -235.83 ml     Exam: General: A/O 4, positive SOB, No acute respiratory distress Eyes: Negative headache, eye pain, double vision, negative scleral hemorrhage ENT: Negative Runny nose, negative ear pain, negative tinnitus, negative gingival bleeding Neck:  Negative scars, masses, torticollis, lymphadenopathy, JVD Lungs: Clear to auscultation bilateral upper lobes, poor to absent breath sounds remainder of lung fields bilaterally. Negative wheezes, mild bibasilar crackles Cardiovascular: Tachycardic, Regular rhythm without murmur gallop or rub normal S1 and S2 Abdomen:negative abdominal pain, negative dysphagia, Nontender, nondistended, soft, bowel sounds positive, no rebound, no ascites, no appreciable mass Extremities: No significant cyanosis, clubbing, or edema left lower extremities, right AKA negative ulcers/lesions Psychiatric:  Negative depression, negative anxiety, negative fatigue, negative mania  Neurologic:  Cranial nerves II through XII intact, tongue/uvula midline, all extremities muscle strength 5/5, sensation intact throughout,dysarthria, negative expressive aphasia, negative receptive aphasia.     Data Reviewed: Basic Metabolic Panel:  Recent Labs Lab 04/11/15 0230 04/12/15 0450 04/12/15 1140 04/13/15 0225 04/14/15 0600  NA 138 135 134* 135 137  K 4.1 4.4 4.3 3.7 4.0  CL 106 101 101 101 104  CO2 22 23 22 23 23   GLUCOSE 213* 189* 320* 194* 162*  BUN 30* 30* 28* 28* 23*  CREATININE 1.42* 1.41* 1.25* 1.27* 1.10*  CALCIUM 8.8* 8.5* 8.5* 8.3* 8.8*  MG  --  1.6*  --  1.4* 2.0   Liver Function Tests: No  results for input(s): AST, ALT, ALKPHOS, BILITOT, PROT, ALBUMIN in the last 168 hours. No results for input(s): LIPASE, AMYLASE in the last 168 hours. No results for input(s): AMMONIA in the last 168 hours. CBC:  Recent Labs Lab 04/10/15 1824 04/11/15 0230 04/12/15 0450 04/12/15 1140 04/13/15 0225  WBC 10.3 9.4 9.1 8.9 9.1  HGB 10.7* 9.8* 9.9* 10.1* 9.9*  HCT 33.4* 30.9* 31.2* 31.4* 30.9*  MCV 79.5 79.6 79.2 79.7 79.6  PLT 393 339 380 367 340   Cardiac Enzymes:  Recent Labs Lab 04/10/15 1227 04/10/15 1824 04/10/15 2346 04/12/15 0943  TROPONINI 0.30* 0.50* 0.67* 0.27*   BNP (last 3 results)  Recent Labs  04/10/15 0850  BNP 395.3*    ProBNP (last 3 results) No results for input(s): PROBNP in the last 8760 hours.  CBG:  Recent Labs Lab 04/13/15 1144 04/13/15 1649 04/13/15 2007 04/13/15 2335 04/14/15 0341  GLUCAP 183* 228* 224* 192* 167*    Recent Results (from the past 240 hour(s))  Urine culture     Status: None   Collection Time: 04/10/15 12:48 PM  Result Value Ref Range Status   Specimen Description URINE, RANDOM  Final   Special Requests NONE  Final   Culture NO GROWTH 1 DAY  Final   Report Status 04/11/2015 FINAL  Final  Culture, blood (routine x 2)     Status: None (Preliminary result)   Collection Time: 04/10/15  3:15 PM  Result Value Ref Range Status   Specimen  Description BLOOD LEFT FOREARM  Final   Special Requests BOTTLES DRAWN AEROBIC ONLY 5CC  Final   Culture NO GROWTH 3 DAYS  Final   Report Status PENDING  Incomplete  Culture, blood (routine x 2)     Status: None (Preliminary result)   Collection Time: 04/10/15  3:25 PM  Result Value Ref Range Status   Specimen Description BLOOD LEFT ANTECUBITAL  Final   Special Requests BOTTLES DRAWN AEROBIC AND ANAEROBIC 5CC 5CC  Final   Culture NO GROWTH 3 DAYS  Final   Report Status PENDING  Incomplete  MRSA PCR Screening     Status: None   Collection Time: 04/13/15 10:58 AM  Result Value Ref  Range Status   MRSA by PCR NEGATIVE NEGATIVE Final    Comment:        The GeneXpert MRSA Assay (FDA approved for NASAL specimens only), is one component of a comprehensive MRSA colonization surveillance program. It is not intended to diagnose MRSA infection nor to guide or monitor treatment for MRSA infections.      Studies:  Recent x-ray studies have been reviewed in detail by the Attending Physician  Scheduled Meds:  Scheduled Meds: . aspirin  81 mg Oral Daily  . azithromycin  500 mg Intravenous Q24H  . carvedilol  6.25 mg Oral BID WC  . cefTRIAXone (ROCEPHIN)  IV  1 g Intravenous Q24H  . chlorhexidine  15 mL Mouth Rinse BID  . insulin aspart  0-20 Units Subcutaneous 6 times per day  . insulin aspart  10 Units Subcutaneous TID WC  . insulin glargine  22 Units Subcutaneous QHS  . levalbuterol  1.25 mg Nebulization TID  . nitroGLYCERIN  1 inch Topical 4 times per day  . rosuvastatin  20 mg Oral q1800  . sodium chloride  3 mL Intravenous Q12H  . sodium chloride  3 mL Intravenous Q12H    Time spent on care of this patient: 40 mins   Ravis Herne, Geraldo Docker , MD  [facility] Office  [phone] Pager - [phone]  On-Call/Text Page:      [email or web address]      password [email or web address]  If 7PM-7AM, please contact night-coverage [email or web address] Password [email or web address] 04/14/2015, 7:50 AM   LOS: 4 days   Care during the described time interval was provided by me .  I have reviewed this patient's available data, including medical history, events of note, physical examination, and all test results as part of my evaluation. I have personally reviewed and interpreted all radiology studies.   Dia Crawford, MD [phone] Pager

## 2015-04-15 DIAGNOSIS — I5021 Acute systolic (congestive) heart failure: Secondary | ICD-10-CM | POA: Diagnosis present

## 2015-04-15 DIAGNOSIS — R570 Cardiogenic shock: Secondary | ICD-10-CM | POA: Diagnosis present

## 2015-04-15 LAB — GLUCOSE, CAPILLARY
GLUCOSE-CAPILLARY: 216 mg/dL — AB (ref 65–99)
Glucose-Capillary: 108 mg/dL — ABNORMAL HIGH (ref 65–99)
Glucose-Capillary: 119 mg/dL — ABNORMAL HIGH (ref 65–99)
Glucose-Capillary: 252 mg/dL — ABNORMAL HIGH (ref 65–99)
Glucose-Capillary: 219 mg/dL — ABNORMAL HIGH (ref 65–99)

## 2015-04-15 LAB — BASIC METABOLIC PANEL
ANION GAP: 9 (ref 5–15)
BUN: 25 mg/dL — AB (ref 6–20)
CO2: 23 mmol/L (ref 22–32)
CREATININE: 1.36 mg/dL — AB (ref 0.44–1.00)
Calcium: 8.7 mg/dL — ABNORMAL LOW (ref 8.9–10.3)
Chloride: 105 mmol/L (ref 101–111)
GFR calc Af Amer: 48 mL/min — ABNORMAL LOW (ref >60)
GFR, EST NON AFRICAN AMERICAN: 41 mL/min — AB (ref >60)
Glucose, Bld: 114 mg/dL — ABNORMAL HIGH (ref 65–99)
POTASSIUM: 3.8 mmol/L (ref 3.5–5.1)
Sodium: 137 mmol/L (ref 135–145)

## 2015-04-15 LAB — CULTURE, BLOOD (ROUTINE X 2)
CULTURE: NO GROWTH
Culture: NO GROWTH

## 2015-04-15 LAB — CARBOXYHEMOGLOBIN
Carboxyhemoglobin: 1.4 % (ref 0.5–1.5)
Carboxyhemoglobin: 1.1 % (ref 0.5–1.5)
METHEMOGLOBIN: 1.2 % (ref 0.0–1.5)
Methemoglobin: 1 % (ref 0.0–1.5)
O2 SAT: 57.6 %
O2 Saturation: 50.1 %
TOTAL HEMOGLOBIN: 8.6 g/dL — AB (ref 12.0–16.0)
Total hemoglobin: 9.2 g/dL — ABNORMAL LOW (ref 12.0–16.0)

## 2015-04-15 LAB — MAGNESIUM: Magnesium: 1.8 mg/dL (ref 1.7–2.4)

## 2015-04-15 MED ORDER — MAGNESIUM HYDROXIDE 400 MG/5ML PO SUSP
30.0000 mL | Freq: Every day | ORAL | Status: DC | PRN
  Administered 2015-04-15 – 2015-04-16 (×2): 30 mL via ORAL
  Filled 2015-04-15 (×2): qty 30

## 2015-04-15 MED ORDER — HEPARIN SODIUM (PORCINE) 5000 UNIT/ML IJ SOLN
5000.0000 [IU] | Freq: Three times a day (TID) | INTRAMUSCULAR | Status: DC
  Administered 2015-04-15 – 2015-04-22 (×20): 5000 [IU] via SUBCUTANEOUS
  Filled 2015-04-15 (×23): qty 1

## 2015-04-15 MED ORDER — SODIUM CHLORIDE 0.9 % IJ SOLN
10.0000 mL | Freq: Two times a day (BID) | INTRAMUSCULAR | Status: DC
  Administered 2015-04-15 – 2015-04-17 (×4): 10 mL
  Administered 2015-04-17: 20 mL
  Administered 2015-04-18: 10 mL
  Administered 2015-04-18 – 2015-04-19 (×4): 20 mL
  Administered 2015-04-20: 10 mL

## 2015-04-15 MED ORDER — INSULIN ASPART 100 UNIT/ML ~~LOC~~ SOLN
0.0000 [IU] | Freq: Three times a day (TID) | SUBCUTANEOUS | Status: DC
  Administered 2015-04-15: 7 [IU] via SUBCUTANEOUS
  Administered 2015-04-16: 11 [IU] via SUBCUTANEOUS
  Administered 2015-04-16: 4 [IU] via SUBCUTANEOUS
  Administered 2015-04-16: 3 [IU] via SUBCUTANEOUS
  Administered 2015-04-16 – 2015-04-17 (×2): 7 [IU] via SUBCUTANEOUS
  Administered 2015-04-17 (×2): 4 [IU] via SUBCUTANEOUS
  Administered 2015-04-17: 7 [IU] via SUBCUTANEOUS
  Administered 2015-04-18 (×3): 4 [IU] via SUBCUTANEOUS
  Administered 2015-04-18: 11 [IU] via SUBCUTANEOUS
  Administered 2015-04-19: 4 [IU] via SUBCUTANEOUS
  Administered 2015-04-19: 3 [IU] via SUBCUTANEOUS
  Administered 2015-04-19 (×2): 7 [IU] via SUBCUTANEOUS
  Administered 2015-04-20: 11 [IU] via SUBCUTANEOUS
  Administered 2015-04-21 (×2): 4 [IU] via SUBCUTANEOUS
  Administered 2015-04-21: 11 [IU] via SUBCUTANEOUS
  Administered 2015-04-22: 7 [IU] via SUBCUTANEOUS

## 2015-04-15 MED ORDER — SODIUM CHLORIDE 0.9 % IJ SOLN: 10.0000 mL | INTRAMUSCULAR | Status: DC | PRN

## 2015-04-15 MED ORDER — CETYLPYRIDINIUM CHLORIDE 0.05 % MT LIQD
7.0000 mL | Freq: Two times a day (BID) | OROMUCOSAL | Status: DC
  Administered 2015-04-16 – 2015-04-22 (×9): 7 mL via OROMUCOSAL

## 2015-04-15 MED ORDER — MILRINONE IN DEXTROSE 20 MG/100ML IV SOLN
0.1250 ug/kg/min | INTRAVENOUS | Status: DC
  Administered 2015-04-15: 0.25 ug/kg/min via INTRAVENOUS
  Administered 2015-04-16: 0.125 ug/kg/min via INTRAVENOUS
  Administered 2015-04-16: 0.25 ug/kg/min via INTRAVENOUS
  Filled 2015-04-15 (×3): qty 100

## 2015-04-15 NOTE — Progress Notes (Signed)
Peripherally Inserted Central Catheter/Midline Placement  The IV Nurse has discussed with the patient and/or persons authorized to consent for the patient, the purpose of this procedure and the potential benefits and risks involved with this procedure.  The benefits include less needle sticks, lab draws from the catheter and patient may be discharged home with the catheter.  Risks include, but not limited to, infection, bleeding, blood clot (thrombus formation), and puncture of an artery; nerve damage and irregular heat beat.  Alternatives to this procedure were also discussed.  PICC/Midline Placement Documentation        Jenna Brown, Nicolette Bang 04/15/2015, 12:01 PM

## 2015-04-15 NOTE — Progress Notes (Signed)
Advanced Heart Failure Rounding Note   Subjective:    Feels weak. Breathing improving. But no CP   RHC/LHC 04/14/15  Prox LAD to Mid LAD lesion, 90% stenosed.  Ost Cx to Mid Cx lesion, 95% stenosed.  Ost 2nd Mrg to 2nd Mrg lesion, 80% stenosed.  Mid Cx lesion, 90% stenosed.  Prox RCA lesion, 60% stenosed.  Mid RCA lesion, 50% stenosed.   CO-OX 32% CO 3.0 CI 1.68   Objective:   Weight Range:  Vital Signs:   Temp:  [97.7 F (36.5 C)-98.4 F (36.9 C)] 97.8 F (36.6 C) (07/27 0750) Pulse Rate:  [44-107] 66 (07/27 0828) Resp:  [13-31] 27 (07/27 0828) BP: (80-136)/(45-104) 125/104 mmHg (07/27 0828) SpO2:  [82 %-97 %] 82 % (07/27 0828) Weight:  [164 lb 3.9 oz (74.5 kg)] 164 lb 3.9 oz (74.5 kg) (07/27 0346) Last BM Date: 04/10/15  Weight change: Filed Weights   04/13/15 0419 04/14/15 0400 04/15/15 0346  Weight: 160 lb 15 oz (73 kg) 162 lb 11.2 oz (73.8 kg) 164 lb 3.9 oz (74.5 kg)    Intake/Output:   Intake/Output Summary (Last 24 hours) at 04/15/15 0915 Last data filed at 04/15/15 0800  Gross per 24 hour  Intake    770 ml  Output   1845 ml  Net  -1075 ml     Physical Exam: General: Well developed, well nourished, female in no acute distress. Head: Normocephalic, atraumatic. Neck: Supple without bruits, JVD 10 cm. Lungs: Resp regular and unlabored, rales w/ decreased BS bases. Heart: Distant RRR, S1, S2, + S3, S4, or murmur; no rub. Abdomen: Soft, non-tender, non-distended, BS + x 4.  Extremities: No clubbing, cyanosis, trace L edema. S/p RLE amputation. Neuro: Alert and oriented X 3. Moves all extremities spontaneously. Psych: Normal affect  Telemetry: Sinus Tach 110 Labs: Basic Metabolic Panel:  Recent Labs Lab 04/12/15 0450 04/12/15 1140 04/13/15 0225 04/14/15 0600 04/15/15 0235  NA 135 134* 135 137 137  K 4.4 4.3 3.7 4.0 3.8  CL 101 101 101 104 105  CO2 23 22 23 23 23   GLUCOSE 189* 320* 194* 162* 114*  BUN 30* 28* 28* 23* 25*   CREATININE 1.41* 1.25* 1.27* 1.10* 1.36*  CALCIUM 8.5* 8.5* 8.3* 8.8* 8.7*  MG 1.6*  --  1.4* 2.0 1.8    Liver Function Tests: No results for input(s): AST, ALT, ALKPHOS, BILITOT, PROT, ALBUMIN in the last 168 hours. No results for input(s): LIPASE, AMYLASE in the last 168 hours. No results for input(s): AMMONIA in the last 168 hours.  CBC:  Recent Labs Lab 04/10/15 1824 04/11/15 0230 04/12/15 0450 04/12/15 1140 04/13/15 0225  WBC 10.3 9.4 9.1 8.9 9.1  HGB 10.7* 9.8* 9.9* 10.1* 9.9*  HCT 33.4* 30.9* 31.2* 31.4* 30.9*  MCV 79.5 79.6 79.2 79.7 79.6  PLT 393 339 380 367 340    Cardiac Enzymes:  Recent Labs Lab 04/10/15 1227 04/10/15 1824 04/10/15 2346 04/12/15 0943  TROPONINI 0.30* 0.50* 0.67* 0.27*    BNP: BNP (last 3 results)  Recent Labs  04/10/15 0850  BNP 395.3*    ProBNP (last 3 results) No results for input(s): PROBNP in the last 8760 hours.    Other results:  Imaging:  No results found.   Medications:     Scheduled Medications: . aspirin  81 mg Oral Daily  . azithromycin  500 mg Oral Q24H  . carvedilol  6.25 mg Oral BID WC  . cefTRIAXone (ROCEPHIN)  IV  1 g Intravenous  Q24H  . chlorhexidine  15 mL Mouth Rinse BID  . furosemide  60 mg Intravenous BID  . hydrALAZINE  10 mg Oral TID  . insulin aspart  0-20 Units Subcutaneous 6 times per day  . insulin aspart  12 Units Subcutaneous TID WC  . insulin glargine  25 Units Subcutaneous QHS  . isosorbide dinitrate  10 mg Oral TID  . levalbuterol  1.25 mg Nebulization TID  . potassium chloride  40 mEq Oral Daily  . rosuvastatin  20 mg Oral q1800  . sodium chloride  3 mL Intravenous Q12H     Infusions:     PRN Medications:  acetaminophen, ALPRAZolam, magnesium hydroxide, meclizine, ondansetron (ZOFRAN) IV, sodium chloride   Assessment:  1. Acute Systolic Heart Failure 2. Cardiogenic shock on RHC 7/26 3. ICM per cath 7/26  3. PAD  5. R AKA 6. DMII   7. TIA      Plan/Discussion:    64 year old with ICM/Cardiogenic shock noted on RHC 7/26. Start milrinone 0.25 mcg  Place picc for CVP and CO-OX. Stop bb with cardiogenic shock. Watch BP closely.   Creatinine trending up will watch closely.   Length of Stay: 5   CLEGG,AMY NP-C  04/15/2015, 9:15 AM  Advanced Heart Failure Team Pager [phone] (M-F; [place])  Please contact [facility] for night-coverage after hours (4p -7a ) and weekends on [email or web address]   Patient seen and examined with Darrick Grinder, NP. We discussed all aspects of the encounter. I agree with the assessment and plan as stated above.   Cath films and hemodynamics reviewed personally and discussed with patient. She has severe CAD and LV systolic dysfunction (EF 123456) in the setting of severe DM and PAD s/p R AKAs. Hemodynamics c/w cardiogenic shock. She is not candidate for CABG. Will place PICC line and start inotropic support. Will discuss revascularization strategy with interventional team. Medical management may be best option at this point. The HF team will follow.   The patient is critically ill with multiple organ systems failure and requires high complexity decision making for assessment and support, frequent evaluation and titration of therapies, application of advanced monitoring technologies and extensive interpretation of multiple databases.   Critical Care Time devoted to patient care services described in this note is 35 Minutes.  Bensimhon, Daniel,MD 5:41 PM

## 2015-04-15 NOTE — Progress Notes (Signed)
Pt is currently off bi-pap and on a 6 LPM . RT will continue to monitor.

## 2015-04-15 NOTE — Progress Notes (Signed)
TEAM 1 - Stepdown/ICU TEAM Progress Note  Jenna Brown [id number] DOB: [birth year] DOA: 04/10/2015 PCP: [name], MD  Admit HPI / Brief Narrative: 63-year-[name], HLD, HTN, CAD Diabetes Type 2 uncontrolled, CVA, Rt AKA   Presented from home with acute shortness of breath. History was obtained from the patient who reported that she has been having worsening shortness of breath for last 3 days. She has noticed worsening orthopnea, PND, mostly sitting up with 4 pillows, has noticed weight gain of 6 lbs (unable to tell me the duration), swelling in her left leg. However this morning, patient had difficult time catching her breath and EMS was called. Per EMS, O2 sats were 85% on room air and she was placed on NRB mask. She denied any fevers, chills, productive cough or any wheezing. She did have discomfort in her left upper abdomen at the time of triage however denies any abdominal pain at this time. ER workup/EDP recommendations reviewed, patient was placed on BiPAP in ED. She was evaluated by cardiology in ED, Dr Acie Fredrickson recommended pulmonary critical care evaluation.  ER workup showed sodium 137, BUN 26, creatinine 1.49, glucose 493, BNP 395.3, troponin 0.15 WBCs 11.5, hemoglobin 10.9 Elevated d-dimer of 0.85   HPI/Subjective: 7/27 A/O 4, positive SOB but states significantly improved. negative CP, negative N/V. States smoked 1/2 PPD 20 years  Assessment/Plan: Acute respiratory failure with hypoxia:  -multifactorial to include fluid overload, NSTEMI, and HCAP? --Strict in and out since admission -2.3L -Daily a.m. Admission Weight= 72.5 kg            7/27 weight= 74.5 kg (question accuracy) -Given patient's multiple medical problems complete seven-day regimen antibiotics -Xopenex nebulizer TID -Titrate O2 to maintain SPO2 89-93% -BiPAP PRN  NSTEMI  -Echocardiogram; LVEF= 20% see results below  -S/P Cardiac catheterization showing three-vessel  disease; per cardiothoracic surgery not candidate for CABG    Acute Systolic CHF   -See acute respiratory failure -Continue Lasix 60 mg BID -Hydralazine 10 mg TID -Imdur 10 mg TID -Milrinone per cardiology   Hypertension -See acute systolic CHF   Peripheral vascular disease  - continue aspirin 81 mg daily  Hyperlipidemia -Obtain lipid panel -Crestor 20 mg daily   DM Type II (diabetes mellitus, type 2) uncontrolled - 7/22 hemoglobin A1c= 11.3 -Lipid panel ordered -Continue Lantus to 25 units daily -Continue resistant SSI -Continue NovoLog 12 units QAC  Acute renal failure ( Unknown baseline)  -Cr bumped up today continue to monitor closely  -Hold all nephrotoxic medication -Hold metformin, HCTZ, ibuprofen, losartan  - Monitor creatinine    Code Status: FULL Family Communication: No family present at time of exam Disposition Plan:  Per cardiothoracic surgery    Consultants: Dr. Thayer Headings (cardiology) [facility]   Procedure/Significant Events: 7/23 echocardiogram;- LVEF=20%. The entire mid-ventricle and apex are akinetic.(could be a Takotsubo ventricle). 7/25 Rt/Lt heart cardiac catheterization-;Prox LAD to Mid LAD lesion, 90% stenosed. -Ost Cx to Mid Cx lesion, 95% stenosed.-Ost 2nd Mrg to 2nd Mrg lesion, 80% stenosed. -Mid Cx lesion, 90% stenosed.-Prox RCA lesion, 60% stenosed.-Mid RCA lesion, 50% stenosed.    Culture 7/22 urine NGTD 7/22 blood left forearm/antecubital NGTD   Antibiotics: Azithromycin 7/22>> stopped 7/28 Ceftriaxone 7/22>> stopped 7/28  DVT prophylaxis: Subcutaneous heparin   Devices    LINES / TUBES:      Continuous Infusions: . milrinone 0.25 mcg/kg/min (04/15/15 1900)    Objective: VITAL SIGNS: Temp: 98.1 F (36.7 C) (07/27 1942)  Temp Source: Oral (07/27 1942) BP: 103/67 mmHg (07/27 2000) Pulse Rate: 108 (07/27 2025) SPO2; FIO2:   Intake/Output Summary (Last 24 hours) at 04/15/15  2043 Last data filed at 04/15/15 2000  Gross per 24 hour  Intake 912.48 ml  Output   1660 ml  Net -747.52 ml     Exam: General: A/O 4, positive SOB (improved from 7/26), No acute respiratory distress Eyes: Negative headache, eye pain, double vision, negative scleral hemorrhage ENT: Negative Runny nose, negative ear pain, negative tinnitus, negative gingival bleeding Neck:  Negative scars, masses, torticollis, lymphadenopathy, JVD Lungs: Clear to auscultation bilateral upper lobes, decreased breath sounds left lung fields, crackles right lung fields  Cardiovascular: Tachycardic, Regular rhythm without murmur gallop or rub normal S1 and S2 Abdomen:negative abdominal pain, negative dysphagia, Nontender, nondistended, soft, bowel sounds positive, no rebound, no ascites, no appreciable mass Extremities: No significant cyanosis, clubbing, or edema left lower extremities, right AKA negative ulcers/lesions Psychiatric:  Negative depression, negative anxiety, negative fatigue, negative mania  Neurologic:  Cranial nerves II through XII intact, tongue/uvula midline, all extremities muscle strength 5/5, sensation intact throughout,dysarthria, negative expressive aphasia, negative receptive aphasia.     Data Reviewed: Basic Metabolic Panel:  Recent Labs Lab 04/12/15 0450 04/12/15 1140 04/13/15 0225 04/14/15 0600 04/15/15 0235  NA 135 134* 135 137 137  K 4.4 4.3 3.7 4.0 3.8  CL 101 101 101 104 105  CO2 23 22 23 23 23   GLUCOSE 189* 320* 194* 162* 114*  BUN 30* 28* 28* 23* 25*  CREATININE 1.41* 1.25* 1.27* 1.10* 1.36*  CALCIUM 8.5* 8.5* 8.3* 8.8* 8.7*  MG 1.6*  --  1.4* 2.0 1.8   Liver Function Tests: No results for input(s): AST, ALT, ALKPHOS, BILITOT, PROT, ALBUMIN in the last 168 hours. No results for input(s): LIPASE, AMYLASE in the last 168 hours. No results for input(s): AMMONIA in the last 168 hours. CBC:  Recent Labs Lab 04/10/15 1824 04/11/15 0230 04/12/15 0450  04/12/15 1140 04/13/15 0225  WBC 10.3 9.4 9.1 8.9 9.1  HGB 10.7* 9.8* 9.9* 10.1* 9.9*  HCT 33.4* 30.9* 31.2* 31.4* 30.9*  MCV 79.5 79.6 79.2 79.7 79.6  PLT 393 339 380 367 340   Cardiac Enzymes:  Recent Labs Lab 04/10/15 1227 04/10/15 1824 04/10/15 2346 04/12/15 0943  TROPONINI 0.30* 0.50* 0.67* 0.27*   BNP (last 3 results)  Recent Labs  04/10/15 0850  BNP 395.3*    ProBNP (last 3 results) No results for input(s): PROBNP in the last 8760 hours.  CBG:  Recent Labs Lab 04/14/15 2337 04/15/15 0349 04/15/15 0754 04/15/15 1324 04/15/15 1613  GLUCAP 148* 108* 119* 216* 252*    Recent Results (from the past 240 hour(s))  Urine culture     Status: None   Collection Time: 04/10/15 12:48 PM  Result Value Ref Range Status   Specimen Description URINE, RANDOM  Final   Special Requests NONE  Final   Culture NO GROWTH 1 DAY  Final   Report Status 04/11/2015 FINAL  Final  Culture, blood (routine x 2)     Status: None   Collection Time: 04/10/15  3:15 PM  Result Value Ref Range Status   Specimen Description BLOOD LEFT FOREARM  Final   Special Requests BOTTLES DRAWN AEROBIC ONLY 5CC  Final   Culture NO GROWTH 5 DAYS  Final   Report Status 04/15/2015 FINAL  Final  Culture, blood (routine x 2)     Status: None   Collection Time: 04/10/15  3:25 PM  Result Value Ref Range Status   Specimen Description BLOOD LEFT ANTECUBITAL  Final   Special Requests BOTTLES DRAWN AEROBIC AND ANAEROBIC 5CC 5CC  Final   Culture NO GROWTH 5 DAYS  Final   Report Status 04/15/2015 FINAL  Final  MRSA PCR Screening     Status: None   Collection Time: 04/13/15 10:58 AM  Result Value Ref Range Status   MRSA by PCR NEGATIVE NEGATIVE Final    Comment:        The GeneXpert MRSA Assay (FDA approved for NASAL specimens only), is one component of a comprehensive MRSA colonization surveillance program. It is not intended to diagnose MRSA infection nor to guide or monitor treatment for MRSA  infections.      Studies:  Recent x-ray studies have been reviewed in detail by the Attending Physician  Scheduled Meds:  Scheduled Meds: . aspirin  81 mg Oral Daily  . azithromycin  500 mg Oral Q24H  . cefTRIAXone (ROCEPHIN)  IV  1 g Intravenous Q24H  . furosemide  60 mg Intravenous BID  . hydrALAZINE  10 mg Oral TID  . insulin aspart  0-20 Units Subcutaneous TID AC & HS  . insulin aspart  12 Units Subcutaneous TID WC  . insulin glargine  25 Units Subcutaneous QHS  . isosorbide dinitrate  10 mg Oral TID  . levalbuterol  1.25 mg Nebulization TID  . potassium chloride  40 mEq Oral Daily  . rosuvastatin  20 mg Oral q1800  . sodium chloride  10-40 mL Intracatheter Q12H  . sodium chloride  3 mL Intravenous Q12H    Time spent on care of this patient: 40 mins   WOODS, Geraldo Docker , MD  [facility] Office  [phone] Pager - [phone]  On-Call/Text Page:      [email or web address]      password [email or web address]  If 7PM-7AM, please contact night-coverage [email or web address] Password [email or web address] 04/15/2015, 8:43 PM   LOS: 5 days   Care during the described time interval was provided by me .  I have reviewed this patient's available data, including medical history, events of note, physical examination, and all test results as part of my evaluation. I have personally reviewed and interpreted all radiology studies.   Dia Crawford, MD [phone] Pager

## 2015-04-15 NOTE — Progress Notes (Signed)
Patient Name: Jenna Brown Date of Encounter: 04/15/2015  Principal Problem:   Acute respiratory failure with hypoxia Active Problems:   Hypertension   Hyperlipidemia   Peripheral vascular disease   DMII (diabetes mellitus, type 2)   Tachycardia   CHF (congestive heart failure)   Acute respiratory failure   Acute kidney injury   Acute respiratory failure with hypoxemia   Acute pulmonary edema   Acute exacerbation of CHF (congestive heart failure)   Hypoxia   Atelectasis   Elevated d-dimer   NSTEMI (non-ST elevated myocardial infarction)   Acute systolic CHF (congestive heart failure)   Essential hypertension   HLD (hyperlipidemia)   Metabolic acidosis   Diabetes type 2, uncontrolled   Acute renal failure syndrome   Cardiac enzymes elevated   Primary Cardiologist: Dr Acie Fredrickson  Patient Profile: 64 yo female w/ hx PVD, HL, TIA, HTN, CKD, admitted 07/22 for SOB. Cards following for NSTEMI. EF 20% by echo. Cath 07/25 w/ multi-vessel dz, TCTS consult but not a surgical candidate per Dr. Prescott Gum note on 7/26.  SUBJECTIVE: Breathing unchanged, still sitting up mostly at night.  OBJECTIVE Filed Vitals:   04/15/15 0400 04/15/15 0500 04/15/15 0750 04/15/15 0824  BP: 92/49 95/62 121/65   Pulse: 84 96 101   Temp:   97.8 F (36.6 C)   TempSrc:   Oral   Resp: 19 19 21    Height:      Weight:      SpO2: 93% 95% 97% 95%    Intake/Output Summary (Last 24 hours) at 04/15/15 0830 Last data filed at 04/15/15 0546  Gross per 24 hour  Intake    890 ml  Output   1725 ml  Net   -835 ml   Filed Weights   04/13/15 0419 04/14/15 0400 04/15/15 0346  Weight: 160 lb 15 oz (73 kg) 162 lb 11.2 oz (73.8 kg) 164 lb 3.9 oz (74.5 kg)    PHYSICAL EXAM General: Well developed, well nourished, female in no acute distress. Head: Normocephalic, atraumatic.  Neck: Supple without bruits, JVD 10 cm. Lungs:  Resp regular and unlabored, rales w/ decreased BS bases. Heart: RRR, S1, S2, no  S3, S4, or murmur; no rub. Abdomen: Soft, non-tender, non-distended, BS + x 4.  Extremities: No clubbing, cyanosis, trace L edema. S/p RLE amputation. Neuro: Alert and oriented X 3. Moves all extremities spontaneously. Psych: Normal affect.  LABS: CBC:  Recent Labs  04/12/15 1140 04/13/15 0225  WBC 8.9 9.1  HGB 10.1* 9.9*  HCT 31.4* 30.9*  MCV 79.7 79.6  PLT 367 340   INR:  Recent Labs  04/12/15 1140  INR [id number]   Basic Metabolic Panel:  Recent Labs  04/14/15 0600 04/15/15 0235  NA 137 137  K 4.0 3.8  CL 104 105  CO2 23 23  GLUCOSE 162* 114*  BUN 23* 25*  CREATININE 1.10* 1.36*  CALCIUM 8.8* 8.7*  MG 2.0 1.8   Cardiac Enzymes:  Recent Labs  04/12/15 0943  TROPONINI 0.27*   BNP:  B NATRIURETIC PEPTIDE  Date/Time Value Ref Range Status  04/10/2015 08:50 AM 395.3* 0.0 - 100.0 pg/mL Final   Fasting Lipid Panel:No results for input(s): CHOL, HDL, LDLCALC, TRIG, CHOLHDL, LDLDIRECT in the last 72 hours. TELE: SR, bigeminy at times     Cath: 04/13/2015    Radiology/Studies: No results found.   Current Medications:  . aspirin  81 mg Oral Daily  . azithromycin  500 mg Oral Q24H  .  carvedilol  6.25 mg Oral BID WC  . cefTRIAXone (ROCEPHIN)  IV  1 g Intravenous Q24H  . chlorhexidine  15 mL Mouth Rinse BID  . furosemide  60 mg Intravenous BID  . hydrALAZINE  10 mg Oral TID  . insulin aspart  0-20 Units Subcutaneous 6 times per day  . insulin aspart  12 Units Subcutaneous TID WC  . insulin glargine  25 Units Subcutaneous QHS  . isosorbide dinitrate  10 mg Oral TID  . levalbuterol  1.25 mg Nebulization TID  . potassium chloride  40 mEq Oral Daily  . rosuvastatin  20 mg Oral q1800  . sodium chloride  3 mL Intravenous Q12H      ASSESSMENT AND PLAN:   NSTEMI (non-ST elevated myocardial infarction) - multi-vessel dz at cath 07/25, results above - TCTS to see but  PCI recommended - optimal medical therapy w/ ASA, BB, statin - changed nitrates to  isordil 10 mg tid and add hydralazine - possible PCI after optimize resp status first    Acute systolic CHF (congestive heart failure) - needs more diuresis, was on Lasix 60 mg bid, but increased Cr at this time - add hydralazine to the nitrates - is on BB - continue to supp K+ - may need milrinone.  Otherwise, per IM Principal Problem:   Acute respiratory failure with hypoxia Active Problems:   Hypertension   Hyperlipidemia   Peripheral vascular disease   DMII (diabetes mellitus, type 2)   Tachycardia   CHF (congestive heart failure)   Acute respiratory failure   Acute kidney injury   Acute respiratory failure with hypoxemia   Acute pulmonary edema   Acute exacerbation of CHF (congestive heart failure)   Hypoxia   Atelectasis   Elevated d-dimer   Essential hypertension   HLD (hyperlipidemia)   Metabolic acidosis   Diabetes type 2, uncontrolled   Acute renal failure syndrome  Recs: 1)  Holding Lasix today. Cr increased after diuresis yesterday. WIll consult CHF service for inotropes.  If she can be tuned up, would consider PCI of Circ and RCA.  LAD PCI would be more complex given mid vessel occlusion at large diagonal.  HTN: Better. Diastolic still high. No ACE-I due to renal dysfunction.  See cath result for hemodynamics. Elevated filling pressures.   Signed, Larae Grooms S. ,  8:30 AM 04/15/2015

## 2015-04-15 NOTE — Care Management Note (Signed)
Case Management Note  Patient Details  Name: Jenna Brown MRN: [id number] Date of Birth: 04-Jun-1951  Subjective/Objective:      Adm w resp distress              Action/Plan:lives at home w shipmans for per care serv 1 1/2hrs per day.    Expected Discharge Date:                  Expected Discharge Plan:  [place]  In-House Referral:     Discharge planning Services     Post Acute Care Choice:    Choice offered to:     DME Arranged:    DME Agency:     HH Arranged:  PCS/Personal Care Services [facility] Agency:  Other - See comment  Status of Service:     Medicare Important Message Given:  Yes-second notification given Date Medicare IM Given:    Medicare IM give by:    Date Additional Medicare IM Given:    Additional Medicare Important Message give by:     If discussed at [place] of Stay Meetings, dates discussed:    Additional Comments: spoke w pa amy clegg and pt may need home milrinone not sure yet. Will follow and assist as pt progresses.  Lacretia Leigh, RN 04/15/2015, 10:30 AM

## 2015-04-16 DIAGNOSIS — I2510 Atherosclerotic heart disease of native coronary artery without angina pectoris: Secondary | ICD-10-CM | POA: Diagnosis present

## 2015-04-16 DIAGNOSIS — I2583 Coronary atherosclerosis due to lipid rich plaque: Secondary | ICD-10-CM

## 2015-04-16 LAB — MAGNESIUM: Magnesium: 1.8 mg/dL (ref 1.7–2.4)

## 2015-04-16 LAB — GLUCOSE, CAPILLARY
GLUCOSE-CAPILLARY: 185 mg/dL — AB (ref 65–99)
GLUCOSE-CAPILLARY: 213 mg/dL — AB (ref 65–99)
Glucose-Capillary: 255 mg/dL — ABNORMAL HIGH (ref 65–99)
Glucose-Capillary: 142 mg/dL — ABNORMAL HIGH (ref 65–99)

## 2015-04-16 LAB — BASIC METABOLIC PANEL
Anion gap: 6 (ref 5–15)
BUN: 30 mg/dL — ABNORMAL HIGH (ref 6–20)
CALCIUM: 8.7 mg/dL — AB (ref 8.9–10.3)
CO2: 27 mmol/L (ref 22–32)
CREATININE: 1.36 mg/dL — AB (ref 0.44–1.00)
Chloride: 105 mmol/L (ref 101–111)
GFR calc Af Amer: 48 mL/min — ABNORMAL LOW (ref >60)
GFR calc non Af Amer: 41 mL/min — ABNORMAL LOW (ref >60)
Glucose, Bld: 139 mg/dL — ABNORMAL HIGH (ref 65–99)
Potassium: 4.2 mmol/L (ref 3.5–5.1)
SODIUM: 138 mmol/L (ref 135–145)

## 2015-04-16 LAB — CARBOXYHEMOGLOBIN
Carboxyhemoglobin: 1.4 % (ref 0.5–1.5)
METHEMOGLOBIN: 1.1 % (ref 0.0–1.5)
O2 Saturation: 59.6 %
Total hemoglobin: 8.8 g/dL — ABNORMAL LOW (ref 12.0–16.0)

## 2015-04-16 MED ORDER — SPIRONOLACTONE 12.5 MG HALF TABLET
12.5000 mg | ORAL_TABLET | Freq: Every day | ORAL | Status: DC
  Administered 2015-04-16: 12.5 mg via ORAL
  Filled 2015-04-16 (×2): qty 1

## 2015-04-16 MED ORDER — INSULIN GLARGINE 100 UNIT/ML ~~LOC~~ SOLN
30.0000 [IU] | Freq: Every day | SUBCUTANEOUS | Status: DC
  Administered 2015-04-16: 30 [IU] via SUBCUTANEOUS
  Filled 2015-04-16 (×2): qty 0.3

## 2015-04-16 MED ORDER — GUAIFENESIN-DM 100-10 MG/5ML PO SYRP
5.0000 mL | ORAL_SOLUTION | ORAL | Status: DC | PRN
  Administered 2015-04-16 – 2015-04-17 (×2): 5 mL via ORAL
  Filled 2015-04-16 (×2): qty 5

## 2015-04-16 MED ORDER — SODIUM CHLORIDE 0.9 % IV SOLN
INTRAVENOUS | Status: DC | PRN
  Administered 2015-04-16: 23:00:00 via INTRAVENOUS

## 2015-04-16 NOTE — Care Management Important Message (Signed)
Important Message  Patient Details  Name: Jenna Brown MRN: [id number] Date of Birth: Jul 09, 1951   Medicare Important Message Given:  Yes-third notification given    Pricilla Handler 04/16/2015, 12:04 PM

## 2015-04-16 NOTE — Progress Notes (Signed)
[place] TEAM 1 - Stepdown/ICU TEAM Progress Note  Jenna Brown [id number] DOB: [birth year] DOA: 04/10/2015 PCP: [name], MD  Admit HPI / Brief Narrative: 63-year-[name], HLD, HTN, CAD Diabetes Type 2 uncontrolled, CVA, Rt AKA   Presented from home with acute shortness of breath. History was obtained from the patient who reported that she has been having worsening shortness of breath for last 3 days. She has noticed worsening orthopnea, PND, mostly sitting up with 4 pillows, has noticed weight gain of 6 lbs (unable to tell me the duration), swelling in her left leg. However this morning, patient had difficult time catching her breath and EMS was called. Per EMS, O2 sats were 85% on room air and she was placed on NRB mask. She denied any fevers, chills, productive cough or any wheezing. She did have discomfort in her left upper abdomen at the time of triage however denies any abdominal pain at this time. ER workup/EDP recommendations reviewed, patient was placed on BiPAP in ED. She was evaluated by cardiology in ED, Dr Acie Fredrickson recommended pulmonary critical care evaluation.  ER workup showed sodium 137, BUN 26, creatinine 1.49, glucose 493, BNP 395.3, troponin 0.15 WBCs 11.5, hemoglobin 10.9 Elevated d-dimer of 0.85   HPI/Subjective: 7/28 A/O 4, positive SOB but states significantly improved. negative CP, negative N/V. States smoked 1/2 PPD 20 years  Assessment/Plan: Acute respiratory failure with hypoxia:  -multifactorial to include fluid overload, NSTEMI, and CAP? -Strict in and out since admission -4.5L -Daily a.m. Admission Weight= 72.5 kg            7/28 weight= 76.1 kg (question accuracy) -Given patient's multiple medical problems complete seven-day regimen antibiotics -Xopenex nebulizer TID -Titrate O2 to maintain SPO2 89-93% -BiPAP PRN  NSTEMI  -Echocardiogram; LVEF= 20% see results below  -S/P Cardiac catheterization showing three-vessel  disease; per cardiothoracic surgery not candidate for CABG   -Awaiting final recommendations from heart failure team repeat catheterization?  Acute Systolic CHF   -See acute respiratory failure -Hydralazine 10 mg TID -Imdur 10 mg TID -Milrinone per cardiology   Hypertension -See acute systolic CHF   Peripheral vascular disease  - continue aspirin 81 mg daily  Hyperlipidemia -Obtain lipid panel -Crestor 20 mg daily   DM Type II (diabetes mellitus, type 2) uncontrolled - 7/22 hemoglobin A1c= 11.3 -Lipid panel ordered -Increase Lantus to 30 units daily -Continue resistant SSI -Continue NovoLog 12 units QAC  Acute renal failure ( Unknown baseline)  -Cr stable; may be patient's new baseline  -Hold all nephrotoxic medication -Hold metformin, HCTZ, ibuprofen, losartan  - Monitor creatinine    Code Status: FULL Family Communication: No family present at time of exam Disposition Plan:  Per CHF team     Consultants: Dr. Thayer Headings (cardiology) Dr.Wesam Kathryne Sharper [facility])   Procedure/Significant Events: 7/23 echocardiogram;- LVEF=20%. The entire mid-ventricle and apex are akinetic.(could be a Takotsubo ventricle). 7/25 Rt/Lt heart cardiac catheterization-;Prox LAD to Mid LAD lesion, 90% stenosed. -Ost Cx to Mid Cx lesion, 95% stenosed.-Ost 2nd Mrg to 2nd Mrg lesion, 80% stenosed. -Mid Cx lesion, 90% stenosed.-Prox RCA lesion, 60% stenosed.-Mid RCA lesion, 50% stenosed.    Culture 7/22 urine NGTD 7/22 blood left forearm/antecubital NGTD   Antibiotics: Azithromycin 7/22>> stopped 7/28 Ceftriaxone 7/22>> stopped 7/28  DVT prophylaxis: Subcutaneous heparin   Devices    LINES / TUBES:      Continuous Infusions: . milrinone 0.25 mcg/kg/min (04/16/15 0400)    Objective: VITAL SIGNS: Temp: 98.1 F (  36.7 C) (07/28 1109) Temp Source: Oral (07/28 1109) BP: 104/53 mmHg (07/28 1525) Pulse Rate: 108 (07/28 1200) SPO2; FIO2:   Intake/Output Summary  (Last 24 hours) at 04/16/15 1631 Last data filed at 04/16/15 1500  Gross per 24 hour  Intake  832.9 ml  Output   2980 ml  Net -2147.1 ml     Exam: General: A/O 4, sitting in chair comfortably, No acute respiratory distress Eyes: Negative headache, eye pain, double vision, negative scleral hemorrhage ENT: Negative Runny nose, negative ear pain, negative tinnitus, negative gingival bleeding Neck:  Negative scars, masses, torticollis, lymphadenopathy, JVD Lungs: Clear to auscultation bilateral , negative crackles negative wheezing  Cardiovascular: Tachycardic, Regular rhythm without murmur gallop or rub normal S1 and S2 Abdomen:negative abdominal pain, negative dysphagia, Nontender, nondistended, soft, bowel sounds positive, no rebound, no ascites, no appreciable mass Extremities: No significant cyanosis, clubbing, or edema left lower extremities, right AKA negative ulcers/lesions Psychiatric:  Negative depression, negative anxiety, negative fatigue, negative mania  Neurologic:  Cranial nerves II through XII intact, tongue/uvula midline, all extremities muscle strength 5/5, sensation intact throughout,dysarthria, negative expressive aphasia, negative receptive aphasia.     Data Reviewed: Basic Metabolic Panel:  Recent Labs Lab 04/12/15 0450 04/12/15 1140 04/13/15 0225 04/14/15 0600 04/15/15 0235 04/16/15 0500  NA 135 134* 135 137 137 138  K 4.4 4.3 3.7 4.0 3.8 4.2  CL 101 101 101 104 105 105  CO2 23 22 23 23 23 27   GLUCOSE 189* 320* 194* 162* 114* 139*  BUN 30* 28* 28* 23* 25* 30*  CREATININE 1.41* 1.25* 1.27* 1.10* 1.36* 1.36*  CALCIUM 8.5* 8.5* 8.3* 8.8* 8.7* 8.7*  MG 1.6*  --  1.4* 2.0 1.8 1.8   Liver Function Tests: No results for input(s): AST, ALT, ALKPHOS, BILITOT, PROT, ALBUMIN in the last 168 hours. No results for input(s): LIPASE, AMYLASE in the last 168 hours. No results for input(s): AMMONIA in the last 168 hours. CBC:  Recent Labs Lab 04/10/15 1824  04/11/15 0230 04/12/15 0450 04/12/15 1140 04/13/15 0225  WBC 10.3 9.4 9.1 8.9 9.1  HGB 10.7* 9.8* 9.9* 10.1* 9.9*  HCT 33.4* 30.9* 31.2* 31.4* 30.9*  MCV 79.5 79.6 79.2 79.7 79.6  PLT 393 339 380 367 340   Cardiac Enzymes:  Recent Labs Lab 04/10/15 1227 04/10/15 1824 04/10/15 2346 04/12/15 0943  TROPONINI 0.30* 0.50* 0.67* 0.27*   BNP (last 3 results)  Recent Labs  04/10/15 0850  BNP 395.3*    ProBNP (last 3 results) No results for input(s): PROBNP in the last 8760 hours.  CBG:  Recent Labs Lab 04/15/15 1324 04/15/15 1613 04/15/15 1944 04/16/15 0807 04/16/15 1107  GLUCAP 216* 252* 219* 185* 213*    Recent Results (from the past 240 hour(s))  Urine culture     Status: None   Collection Time: 04/10/15 12:48 PM  Result Value Ref Range Status   Specimen Description URINE, RANDOM  Final   Special Requests NONE  Final   Culture NO GROWTH 1 DAY  Final   Report Status 04/11/2015 FINAL  Final  Culture, blood (routine x 2)     Status: None   Collection Time: 04/10/15  3:15 PM  Result Value Ref Range Status   Specimen Description BLOOD LEFT FOREARM  Final   Special Requests BOTTLES DRAWN AEROBIC ONLY 5CC  Final   Culture NO GROWTH 5 DAYS  Final   Report Status 04/15/2015 FINAL  Final  Culture, blood (routine x 2)  Status: None   Collection Time: 04/10/15  3:25 PM  Result Value Ref Range Status   Specimen Description BLOOD LEFT ANTECUBITAL  Final   Special Requests BOTTLES DRAWN AEROBIC AND ANAEROBIC 5CC 5CC  Final   Culture NO GROWTH 5 DAYS  Final   Report Status 04/15/2015 FINAL  Final  MRSA PCR Screening     Status: None   Collection Time: 04/13/15 10:58 AM  Result Value Ref Range Status   MRSA by PCR NEGATIVE NEGATIVE Final    Comment:        The GeneXpert MRSA Assay (FDA approved for NASAL specimens only), is one component of a comprehensive MRSA colonization surveillance program. It is not intended to diagnose MRSA infection nor to guide  or monitor treatment for MRSA infections.      Studies:  Recent x-ray studies have been reviewed in detail by the Attending Physician  Scheduled Meds:  Scheduled Meds: . antiseptic oral rinse  7 mL Mouth Rinse BID  . aspirin  81 mg Oral Daily  . heparin subcutaneous  5,000 Units Subcutaneous 3 times per day  . hydrALAZINE  10 mg Oral TID  . insulin aspart  0-20 Units Subcutaneous TID AC & HS  . insulin aspart  12 Units Subcutaneous TID WC  . insulin glargine  25 Units Subcutaneous QHS  . isosorbide dinitrate  10 mg Oral TID  . levalbuterol  1.25 mg Nebulization TID  . potassium chloride  40 mEq Oral Daily  . rosuvastatin  20 mg Oral q1800  . sodium chloride  10-40 mL Intracatheter Q12H  . sodium chloride  3 mL Intravenous Q12H  . spironolactone  12.5 mg Oral Daily    Time spent on care of this patient: 40 mins   Tyreona Panjwani, Geraldo Docker , MD  [facility] Office  [phone] Pager - [phone]  On-Call/Text Page:      [email or web address]      password [email or web address]  If 7PM-7AM, please contact night-coverage [email or web address] Password [email or web address] 04/16/2015, 4:31 PM   LOS: 6 days   Care during the described time interval was provided by me .  I have reviewed this patient's available data, including medical history, events of note, physical examination, and all test results as part of my evaluation. I have personally reviewed and interpreted all radiology studies.   Dia Crawford, MD [phone] Pager

## 2015-04-16 NOTE — Progress Notes (Signed)
Have been following pt from a distance however she does not have her prosthesis here. She sts that she uses w/c in the house and uses her prosthesis and RW when she goes out. Asked pt to have her prosthesis brought from home and asked RN to put in PT order. Will f/u for education. Yves Dill CES, ACSM 1:26 PM 04/16/2015

## 2015-04-16 NOTE — Progress Notes (Signed)
Advanced Heart Failure Rounding Note   Subjective:    Yesterday she started on milrinone and continued to diurese with IV lasix. Weight not accurate. CVP 4.   Breathing improving but still with mild dyspnea. No CP   RHC/LHC 04/14/15  Prox LAD to Mid LAD lesion, 90% stenosed.  Ost Cx to Mid Cx lesion, 95% stenosed.  Ost 2nd Mrg to 2nd Mrg lesion, 80% stenosed.  Mid Cx lesion, 90% stenosed.  Prox RCA lesion, 60% stenosed.  Mid RCA lesion, 50% stenosed. CO-OX 32% CO 3.0 CI 1.68   Objective:   Weight Range:  Vital Signs:   Temp:  [97.8 F (36.6 C)-98.5 F (36.9 C)] 97.8 F (36.6 C) (07/28 0810) Pulse Rate:  [95-108] 106 (07/28 0811) Resp:  [19-26] 21 (07/28 0811) BP: (97-128)/(45-82) 128/63 mmHg (07/28 0811) SpO2:  [91 %-98 %] 98 % (07/28 0811) Weight:  [167 lb 12.3 oz (76.1 kg)] 167 lb 12.3 oz (76.1 kg) (07/28 0315) Last BM Date: 04/09/15  Weight change: Filed Weights   04/14/15 0400 04/15/15 0346 04/16/15 0315  Weight: 162 lb 11.2 oz (73.8 kg) 164 lb 3.9 oz (74.5 kg) 167 lb 12.3 oz (76.1 kg)    Intake/Output:   Intake/Output Summary (Last 24 hours) at 04/16/15 1053 Last data filed at 04/16/15 1000  Gross per 24 hour  Intake 1350.88 ml  Output   3015 ml  Net -1664.12 ml    CVP 4  Physical Exam: General: Well developed, well nourished, female in no acute distress. Head: Normocephalic, atraumatic. Neck: Supple without bruits, JVD 6-7 cm. Lungs: Resp regular and unlabored, rales w/ decreased BS bases. Heart: Distant RRR, S1, S2, + S3, S4, or murmur; no rub. Abdomen: Soft, non-tender, non-distended, BS + x 4.  Extremities: No clubbing, cyanosis, no L edema. S/p RLE amputation.RUE PICC  Neuro: Alert and oriented X 3. Moves all extremities spontaneously. Psych: Normal affect  Telemetry: Sinus Tach 110 Labs: Basic Metabolic Panel:  Recent Labs Lab 04/12/15 0450 04/12/15 1140 04/13/15 0225 04/14/15 0600 04/15/15 0235 04/16/15 0500  NA 135 134*  135 137 137 138  K 4.4 4.3 3.7 4.0 3.8 4.2  CL 101 101 101 104 105 105  CO2 23 22 23 23 23 27   GLUCOSE 189* 320* 194* 162* 114* 139*  BUN 30* 28* 28* 23* 25* 30*  CREATININE 1.41* 1.25* 1.27* 1.10* 1.36* 1.36*  CALCIUM 8.5* 8.5* 8.3* 8.8* 8.7* 8.7*  MG 1.6*  --  1.4* 2.0 1.8 1.8    Liver Function Tests: No results for input(s): AST, ALT, ALKPHOS, BILITOT, PROT, ALBUMIN in the last 168 hours. No results for input(s): LIPASE, AMYLASE in the last 168 hours. No results for input(s): AMMONIA in the last 168 hours.  CBC:  Recent Labs Lab 04/10/15 1824 04/11/15 0230 04/12/15 0450 04/12/15 1140 04/13/15 0225  WBC 10.3 9.4 9.1 8.9 9.1  HGB 10.7* 9.8* 9.9* 10.1* 9.9*  HCT 33.4* 30.9* 31.2* 31.4* 30.9*  MCV 79.5 79.6 79.2 79.7 79.6  PLT 393 339 380 367 340    Cardiac Enzymes:  Recent Labs Lab 04/10/15 1227 04/10/15 1824 04/10/15 2346 04/12/15 0943  TROPONINI 0.30* 0.50* 0.67* 0.27*    BNP: BNP (last 3 results)  Recent Labs  04/10/15 0850  BNP 395.3*    ProBNP (last 3 results) No results for input(s): PROBNP in the last 8760 hours.    Other results:  Imaging: No results found.   Medications:     Scheduled Medications: . antiseptic oral rinse  7 mL Mouth Rinse BID  . aspirin  81 mg Oral Daily  . azithromycin  500 mg Oral Q24H  . cefTRIAXone (ROCEPHIN)  IV  1 g Intravenous Q24H  . heparin subcutaneous  5,000 Units Subcutaneous 3 times per day  . hydrALAZINE  10 mg Oral TID  . insulin aspart  0-20 Units Subcutaneous TID AC & HS  . insulin aspart  12 Units Subcutaneous TID WC  . insulin glargine  25 Units Subcutaneous QHS  . isosorbide dinitrate  10 mg Oral TID  . levalbuterol  1.25 mg Nebulization TID  . potassium chloride  40 mEq Oral Daily  . rosuvastatin  20 mg Oral q1800  . sodium chloride  10-40 mL Intracatheter Q12H  . sodium chloride  3 mL Intravenous Q12H    Infusions: . milrinone 0.25 mcg/kg/min (04/16/15 0400)    PRN  Medications: acetaminophen, ALPRAZolam, magnesium hydroxide, meclizine, ondansetron (ZOFRAN) IV, sodium chloride, sodium chloride   Assessment:  1. Acute Systolic Heart Failure 2. Cardiogenic shock on RHC 7/26 3. ICM per cath 7/26  3. PAD  5. R AKA 6. DMII   7. TIA     Plan/Discussion:    64 year old with ICM/Cardiogenic shock noted on RHC 7/26.   Started milrinone 0.25 mcg 7/27 with improved CO-OX. Todays CO-OX 59%.Consider weaning tomorrow. Volume status imporved. CVP 4. Stop IV lasix. Start 12.5 mg spiro. Continue low dose hydralazine/isordil.  Renal function ok. No BB with shock. Add dig next day or so.   May need home inotropes if unable to wean.   Consult cardiac rehab.   Length of Stay: 6   CLEGG,AMY NP-C  04/16/2015, 10:53 AM  Advanced Heart Failure Team Pager [phone] (M-F; [place])  Please contact [facility] for night-coverage after hours (4p -7a ) and weekends on [email or web address]  Patient seen and examined with Darrick Grinder, NP. We discussed all aspects of the encounter. I agree with the assessment and plan as stated above.   She is much improved with milrinone and IV diuresis. I reviewed cath films and I think medical therapy is the best option at this point. Will try to wean milrinone slowly and see how she does. Discussed with Dr. Sherral Hammers.   Lillie Portner,MD 10:45 PM

## 2015-04-17 LAB — CARBOXYHEMOGLOBIN
CARBOXYHEMOGLOBIN: 2 % — AB (ref 0.5–1.5)
CARBOXYHEMOGLOBIN: 1.6 % — AB (ref 0.5–1.5)
Carboxyhemoglobin: 1.8 % — ABNORMAL HIGH (ref 0.5–1.5)
Methemoglobin: 0.8 % (ref 0.0–1.5)
Methemoglobin: 1.2 % (ref 0.0–1.5)
Methemoglobin: 1 % (ref 0.0–1.5)
O2 SAT: 99.1 %
O2 Saturation: 57.8 %
O2 Saturation: 53.4 %
TOTAL HEMOGLOBIN: 9 g/dL — AB (ref 12.0–16.0)
TOTAL HEMOGLOBIN: 9.4 g/dL — AB (ref 12.0–16.0)
Total hemoglobin: 9.4 g/dL — ABNORMAL LOW (ref 12.0–16.0)

## 2015-04-17 LAB — GLUCOSE, CAPILLARY
GLUCOSE-CAPILLARY: 159 mg/dL — AB (ref 65–99)
GLUCOSE-CAPILLARY: 198 mg/dL — AB (ref 65–99)
Glucose-Capillary: 203 mg/dL — ABNORMAL HIGH (ref 65–99)
Glucose-Capillary: 221 mg/dL — ABNORMAL HIGH (ref 65–99)

## 2015-04-17 LAB — BASIC METABOLIC PANEL
Anion gap: 7 (ref 5–15)
BUN: 30 mg/dL — AB (ref 6–20)
CALCIUM: 8.9 mg/dL (ref 8.9–10.3)
CHLORIDE: 102 mmol/L (ref 101–111)
CO2: 27 mmol/L (ref 22–32)
Creatinine, Ser: 1.22 mg/dL — ABNORMAL HIGH (ref 0.44–1.00)
GFR calc Af Amer: 54 mL/min — ABNORMAL LOW (ref >60)
GFR, EST NON AFRICAN AMERICAN: 47 mL/min — AB (ref >60)
GLUCOSE: 175 mg/dL — AB (ref 65–99)
Potassium: 4.6 mmol/L (ref 3.5–5.1)
SODIUM: 136 mmol/L (ref 135–145)

## 2015-04-17 LAB — MAGNESIUM: Magnesium: 2.1 mg/dL (ref 1.7–2.4)

## 2015-04-17 MED ORDER — HYDRALAZINE HCL 25 MG PO TABS
12.5000 mg | ORAL_TABLET | Freq: Three times a day (TID) | ORAL | Status: DC
  Administered 2015-04-17 – 2015-04-19 (×9): 12.5 mg via ORAL
  Filled 2015-04-17 (×12): qty 0.5

## 2015-04-17 MED ORDER — INSULIN GLARGINE 100 UNIT/ML ~~LOC~~ SOLN
35.0000 [IU] | Freq: Every day | SUBCUTANEOUS | Status: DC
  Administered 2015-04-17: 35 [IU] via SUBCUTANEOUS
  Filled 2015-04-17 (×3): qty 0.35

## 2015-04-17 MED ORDER — INSULIN ASPART 100 UNIT/ML ~~LOC~~ SOLN
15.0000 [IU] | Freq: Three times a day (TID) | SUBCUTANEOUS | Status: DC
  Administered 2015-04-18 (×2): 15 [IU] via SUBCUTANEOUS

## 2015-04-17 MED ORDER — FUROSEMIDE 40 MG PO TABS
40.0000 mg | ORAL_TABLET | Freq: Every day | ORAL | Status: DC
  Administered 2015-04-17 – 2015-04-22 (×6): 40 mg via ORAL
  Filled 2015-04-17 (×6): qty 1

## 2015-04-17 MED ORDER — SPIRONOLACTONE 25 MG PO TABS
25.0000 mg | ORAL_TABLET | Freq: Every day | ORAL | Status: DC
  Administered 2015-04-17 – 2015-04-22 (×6): 25 mg via ORAL
  Filled 2015-04-17 (×6): qty 1

## 2015-04-17 MED ORDER — LOSARTAN POTASSIUM 25 MG PO TABS
12.5000 mg | ORAL_TABLET | Freq: Every day | ORAL | Status: DC
  Administered 2015-04-17 – 2015-04-22 (×6): 12.5 mg via ORAL
  Filled 2015-04-17 (×6): qty 0.5

## 2015-04-17 MED ORDER — ISOSORBIDE MONONITRATE 15 MG HALF TABLET
15.0000 mg | ORAL_TABLET | Freq: Every day | ORAL | Status: DC
  Administered 2015-04-17 – 2015-04-19 (×3): 15 mg via ORAL
  Filled 2015-04-17 (×4): qty 1

## 2015-04-17 MED ORDER — DIGOXIN 125 MCG PO TABS
0.1250 mg | ORAL_TABLET | Freq: Every day | ORAL | Status: DC
  Administered 2015-04-17 – 2015-04-22 (×6): 0.125 mg via ORAL
  Filled 2015-04-17 (×6): qty 1

## 2015-04-17 MED ORDER — POTASSIUM CHLORIDE 20 MEQ/15ML (10%) PO SOLN
40.0000 meq | Freq: Every day | ORAL | Status: DC
  Administered 2015-04-17 – 2015-04-18 (×2): 40 meq via ORAL
  Filled 2015-04-17 (×3): qty 30

## 2015-04-17 NOTE — Progress Notes (Signed)
poa Jenna Brown req eval for new light weight prosthesis for leg. Her prosthesis is old and very heavy. Faxed order to [facility] fax [phone], office [phone]. Pt to go home w Jenna Brown at [facility] [address] [place] [phone], phone [phone]. Have alerted [facility] of change in address. Have given prescription to Jenna to follow up on appt after disch but have faxed in prescription to start getting appt.

## 2015-04-17 NOTE — Care Management Note (Signed)
Case Management Note  Patient Details  Name: Jenna Brown MRN: [id number] Date of Birth: Aug 28, 1951  Subjective/Objective:      Adm resp failure , chf            Action/Plan: lives alone w aid per [facility] for personal care service. Went over [employer] and no pref for hhrn for chf follow up.   Expected Discharge Date:                  Expected Discharge Plan:  [place]  In-House Referral:     Discharge planning Services  CM Consult  Post Acute Care Choice:  Home Health Choice offered to:  Patient  DME Arranged:    DME Agency:     HH Arranged:  PCS/Personal Care Services, RN, Disease Management [place] Agency:  Other - See comment, [place]  Status of Service:     Medicare Important Message Given:  Yes-third notification given Date Medicare IM Given:    Medicare IM give by:    Date Additional Medicare IM Given:    Additional Medicare Important Message give by:     If discussed at [place] of Stay Meetings, dates discussed:    Additional Comments: reft to donna w [facility] homecare for hhrn, may get to go home over weekend.  Lacretia Leigh, RN 04/17/2015, 9:13 AM

## 2015-04-17 NOTE — Progress Notes (Signed)
[place] TEAM 1 - Stepdown/ICU TEAM Progress Note  Jenna Brown [id number] DOB: 22-Oct-1950 DOA: 04/10/2015 PCP: [name], MD  Admit HPI / Brief Narrative: 63-year-[name], HLD, HTN, CAD Diabetes Type 2 uncontrolled, CVA, Rt AKA   Presented from home with acute shortness of breath. History was obtained from the patient who reported that she has been having worsening shortness of breath for last 3 days. She has noticed worsening orthopnea, PND, mostly sitting up with 4 pillows, has noticed weight gain of 6 lbs (unable to tell me the duration), swelling in her left leg. However this morning, patient had difficult time catching her breath and EMS was called. Per EMS, O2 sats were 85% on room air and she was placed on NRB mask. She denied any fevers, chills, productive cough or any wheezing. She did have discomfort in her left upper abdomen at the time of triage however denies any abdominal pain at this time. ER workup/EDP recommendations reviewed, patient was placed on BiPAP in ED. She was evaluated by cardiology in ED, Dr Acie Fredrickson recommended pulmonary critical care evaluation.  ER workup showed sodium 137, BUN 26, creatinine 1.49, glucose 493, BNP 395.3, troponin 0.15 WBCs 11.5, hemoglobin 10.9 Elevated d-dimer of 0.85   HPI/Subjective: 7/29 A/O 4, negative SOB, negative CP, negative N/V. States smoked 1/2 PPD 20 years  Assessment/Plan: Acute respiratory failure with hypoxia:  -multifactorial to include fluid overload, NSTEMI, and CAP? -Strict in and out since admission -5.1L -Daily a.m. Admission Weight= 72.5 kg            7/29 weight standing = 71.9 kg  -completed seven-day regimen antibiotics -Xopenex nebulizer TID -Titrate O2 to maintain SPO2 89-93% -BiPAP PRN  NSTEMI  -Echocardiogram; LVEF= 20% see results below  -S/P Cardiac catheterization showing three-vessel disease; per cardiothoracic surgery not candidate for CABG   -Most likely no  catheterization; medical management  Acute Systolic CHF   -See acute respiratory failure -Digoxin 0.125 mg daily -Furosemide 40 mg daily -Hydralazine 12.5 mg TID -Imdur 15 mg daily -Losartan 12.5 mg daily -Spironolactone 25 mg daily  Hypertension -See acute systolic CHF   Peripheral vascular disease  - continue aspirin 81 mg daily  Hyperlipidemia -Obtain lipid panel -Crestor 20 mg daily   DM Type II (diabetes mellitus, type 2) uncontrolled - 7/22 hemoglobin A1c= 11.3 -Lipid panel ordered -Increase Lantus to 35 units daily -Continue resistant SSI -Increase NovoLog 15 units QAC  Acute renal failure ( Unknown baseline)  -Cr stable; may be patient's new baseline  -Hold all nephrotoxic medication -Hold metformin, HCTZ, ibuprofen - Monitor creatinine    Code Status: FULL Family Communication: No family present at time of exam Disposition Plan:  Per CHF team     Consultants: Dr. Wonda Cheng Nahser (cardiology) [facility]ryne [facility]   Procedure/Significant Events: 7/23 echocardiogram;- LVEF=20%. The entire mid-ventricle and apex are akinetic.(could be a Takotsubo ventricle). 7/25 Rt/Lt heart cardiac catheterization-;Prox LAD to Mid LAD lesion, 90% stenosed. -Ost Cx to Mid Cx lesion, 95% stenosed.-Ost 2nd Mrg to 2nd Mrg lesion, 80% stenosed. -Mid Cx lesion, 90% stenosed.-Prox RCA lesion, 60% stenosed.-Mid RCA lesion, 50% stenosed.    Culture 7/22 urine NGTD 7/22 blood left forearm/antecubital NGTD   Antibiotics: Azithromycin 7/22>> stopped 7/28 Ceftriaxone 7/22>> stopped 7/28  DVT prophylaxis: Subcutaneous heparin   Devices    LINES / TUBES:      Continuous Infusions: . sodium chloride Stopped (04/17/15 0911)    Objective: VITAL SIGNS: Temp: 98.1 F (36.7  C) (07/29 1600) Temp Source: Oral (07/29 1600) BP: 116/84 mmHg (07/29 1800) Pulse Rate: 107 (07/29 1800) SPO2; FIO2:   Intake/Output Summary (Last 24 hours) at 04/17/15 1928 Last  data filed at 04/17/15 1843  Gross per 24 hour  Intake 924.11 ml  Output   1530 ml  Net -605.89 ml     Exam: General: A/O 4, sitting in bed comfortably, No acute respiratory distress Eyes: Negative headache, eye pain, double vision, negative scleral hemorrhage ENT: Negative Runny nose, negative ear pain, negative tinnitus, negative gingival bleeding Neck:  Negative scars, masses, torticollis, lymphadenopathy, JVD Lungs: Clear to auscultation bilateral , negative crackles negative wheezing  Cardiovascular: Tachycardic, Regular rhythm without murmur gallop or rub normal S1 and S2 Abdomen:negative abdominal pain, negative dysphagia, Nontender, nondistended, soft, bowel sounds positive, no rebound, no ascites, no appreciable mass Extremities: No significant cyanosis, clubbing, or edema left lower extremities, right AKA negative ulcers/lesions Psychiatric:  Negative depression, negative anxiety, negative fatigue, negative mania  Neurologic:  Cranial nerves II through XII intact, tongue/uvula midline, all extremities muscle strength 5/5, sensation intact throughout,dysarthria, negative expressive aphasia, negative receptive aphasia.     Data Reviewed: Basic Metabolic Panel:  Recent Labs Lab 04/13/15 0225 04/14/15 0600 04/15/15 0235 04/16/15 0500 04/17/15 0255  NA 135 137 137 138 136  K 3.7 4.0 3.8 4.2 4.6  CL 101 104 105 105 102  CO2 23 23 23 27 27   GLUCOSE 194* 162* 114* 139* 175*  BUN 28* 23* 25* 30* 30*  CREATININE 1.27* 1.10* 1.36* 1.36* 1.22*  CALCIUM 8.3* 8.8* 8.7* 8.7* 8.9  MG 1.4* 2.0 1.8 1.8 2.1   Liver Function Tests: No results for input(s): AST, ALT, ALKPHOS, BILITOT, PROT, ALBUMIN in the last 168 hours. No results for input(s): LIPASE, AMYLASE in the last 168 hours. No results for input(s): AMMONIA in the last 168 hours. CBC:  Recent Labs Lab 04/11/15 0230 04/12/15 0450 04/12/15 1140 04/13/15 0225  WBC 9.4 9.1 8.9 9.1  HGB 9.8* 9.9* 10.1* 9.9*  HCT  30.9* 31.2* 31.4* 30.9*  MCV 79.6 79.2 79.7 79.6  PLT 339 380 367 340   Cardiac Enzymes:  Recent Labs Lab 04/10/15 2346 04/12/15 0943  TROPONINI 0.67* 0.27*   BNP (last 3 results)  Recent Labs  04/10/15 0850  BNP 395.3*    ProBNP (last 3 results) No results for input(s): PROBNP in the last 8760 hours.  CBG:  Recent Labs Lab 04/16/15 1650 04/16/15 2143 04/17/15 0735 04/17/15 1123 04/17/15 1637  GLUCAP 255* 142* 159* 203* 221*    Recent Results (from the past 240 hour(s))  Urine culture     Status: None   Collection Time: 04/10/15 12:48 PM  Result Value Ref Range Status   Specimen Description URINE, RANDOM  Final   Special Requests NONE  Final   Culture NO GROWTH 1 DAY  Final   Report Status 04/11/2015 FINAL  Final  Culture, blood (routine x 2)     Status: None   Collection Time: 04/10/15  3:15 PM  Result Value Ref Range Status   Specimen Description BLOOD LEFT FOREARM  Final   Special Requests BOTTLES DRAWN AEROBIC ONLY 5CC  Final   Culture NO GROWTH 5 DAYS  Final   Report Status 04/15/2015 FINAL  Final  Culture, blood (routine x 2)     Status: None   Collection Time: 04/10/15  3:25 PM  Result Value Ref Range Status   Specimen Description BLOOD LEFT ANTECUBITAL  Final   Special  Requests BOTTLES DRAWN AEROBIC AND ANAEROBIC 5CC 5CC  Final   Culture NO GROWTH 5 DAYS  Final   Report Status 04/15/2015 FINAL  Final  MRSA PCR Screening     Status: None   Collection Time: 04/13/15 10:58 AM  Result Value Ref Range Status   MRSA by PCR NEGATIVE NEGATIVE Final    Comment:        The GeneXpert MRSA Assay (FDA approved for NASAL specimens only), is one component of a comprehensive MRSA colonization surveillance program. It is not intended to diagnose MRSA infection nor to guide or monitor treatment for MRSA infections.      Studies:  Recent x-ray studies have been reviewed in detail by the Attending Physician  Scheduled Meds:  Scheduled Meds: .  antiseptic oral rinse  7 mL Mouth Rinse BID  . aspirin  81 mg Oral Daily  . digoxin  0.125 mg Oral Daily  . furosemide  40 mg Oral Daily  . heparin subcutaneous  5,000 Units Subcutaneous 3 times per day  . hydrALAZINE  12.5 mg Oral TID  . insulin aspart  0-20 Units Subcutaneous TID AC & HS  . insulin aspart  12 Units Subcutaneous TID WC  . insulin glargine  30 Units Subcutaneous QHS  . isosorbide mononitrate  15 mg Oral Daily  . levalbuterol  1.25 mg Nebulization TID  . losartan  12.5 mg Oral Daily  . potassium chloride  40 mEq Oral Daily  . rosuvastatin  20 mg Oral q1800  . sodium chloride  10-40 mL Intracatheter Q12H  . spironolactone  25 mg Oral Daily    Time spent on care of this patient: 40 mins   Nikyah Lackman, Geraldo Docker , MD  [facility] Office  [phone] Pager - [phone]  On-Call/Text Page:      [email or web address]      password [email or web address]  If 7PM-7AM, please contact night-coverage [email or web address] Password [email or web address] 04/17/2015, 7:28 PM   LOS: 7 days   Care during the described time interval was provided by me .  I have reviewed this patient's available data, including medical history, events of note, physical examination, and all test results as part of my evaluation. I have personally reviewed and interpreted all radiology studies.   Dia Crawford, MD [phone] Pager

## 2015-04-17 NOTE — Progress Notes (Addendum)
Advanced Heart Failure Rounding Note   Subjective:    Yesterday  Milrinone was cut back to 0.125 mcg and diuretics held. Negative 1.0 liters. CVP 7. Todays CO-OX 57%     Denies SOB/CP.    RHC/LHC 04/14/15  Prox LAD to Mid LAD lesion, 90% stenosed.  Ost Cx to Mid Cx lesion, 95% stenosed.  Ost 2nd Mrg to 2nd Mrg lesion, 80% stenosed.  Mid Cx lesion, 90% stenosed.  Prox RCA lesion, 60% stenosed.  Mid RCA lesion, 50% stenosed. CO-OX 32% CO 3.0 CI 1.68   Objective:   Weight Range:  Vital Signs:   Temp:  [97.8 F (36.6 C)-98.9 F (37.2 C)] 97.8 F (36.6 C) (07/29 0405) Pulse Rate:  [84-110] 102 (07/29 0400) Resp:  [18-39] 26 (07/29 0400) BP: (95-117)/(38-69) 109/62 mmHg (07/29 0400) SpO2:  [94 %-99 %] 95 % (07/29 0400) Weight:  [158 lb 8.2 oz (71.9 kg)] 158 lb 8.2 oz (71.9 kg) (07/29 0405) Last BM Date: 04/16/15  Weight change: Filed Weights   04/15/15 0346 04/16/15 0315 04/17/15 0405  Weight: 164 lb 3.9 oz (74.5 kg) 167 lb 12.3 oz (76.1 kg) 158 lb 8.2 oz (71.9 kg)    Intake/Output:   Intake/Output Summary (Last 24 hours) at 04/17/15 0826 Last data filed at 04/17/15 [id number]  Gross per 24 hour  Intake 875.86 ml  Output   1980 ml  Net -1104.14 ml    CVP 7   Physical Exam: General: Well developed, well nourished, female in no acute distress. Head: Normocephalic, atraumatic. Neck: Supple without bruits, JVD 6-7 cm. Lungs: Resp regular and unlabored, rales w/ decreased BS bases. Heart: Distant RRR, S1, S2, + S3, S4, or murmur; no rub. Abdomen: Soft, non-tender, non-distended, BS + x 4.  Extremities: No clubbing, cyanosis, no L edema. S/p RLE amputation.RUE PICC  Neuro: Alert and oriented X 3. Moves all extremities spontaneously. Psych: Normal affect  Telemetry: Sinus Tach 110 Labs: Basic Metabolic Panel:  Recent Labs Lab 04/13/15 0225 04/14/15 0600 04/15/15 0235 04/16/15 0500 04/17/15 0255  NA 135 137 137 138 136  K 3.7 4.0 3.8 4.2 4.6  CL 101  104 105 105 102  CO2 23 23 23 27 27   GLUCOSE 194* 162* 114* 139* 175*  BUN 28* 23* 25* 30* 30*  CREATININE 1.27* 1.10* 1.36* 1.36* 1.22*  CALCIUM 8.3* 8.8* 8.7* 8.7* 8.9  MG 1.4* 2.0 1.8 1.8 2.1    Liver Function Tests: No results for input(s): AST, ALT, ALKPHOS, BILITOT, PROT, ALBUMIN in the last 168 hours. No results for input(s): LIPASE, AMYLASE in the last 168 hours. No results for input(s): AMMONIA in the last 168 hours.  CBC:  Recent Labs Lab 04/10/15 1824 04/11/15 0230 04/12/15 0450 04/12/15 1140 04/13/15 0225  WBC 10.3 9.4 9.1 8.9 9.1  HGB 10.7* 9.8* 9.9* 10.1* 9.9*  HCT 33.4* 30.9* 31.2* 31.4* 30.9*  MCV 79.5 79.6 79.2 79.7 79.6  PLT 393 339 380 367 340    Cardiac Enzymes:  Recent Labs Lab 04/10/15 1227 04/10/15 1824 04/10/15 2346 04/12/15 0943  TROPONINI 0.30* 0.50* 0.67* 0.27*    BNP: BNP (last 3 results)  Recent Labs  04/10/15 0850  BNP 395.3*    ProBNP (last 3 results) No results for input(s): PROBNP in the last 8760 hours.    Other results:  Imaging: No results found.   Medications:     Scheduled Medications: . antiseptic oral rinse  7 mL Mouth Rinse BID  . aspirin  81 mg Oral Daily  .  heparin subcutaneous  5,000 Units Subcutaneous 3 times per day  . hydrALAZINE  10 mg Oral TID  . insulin aspart  0-20 Units Subcutaneous TID AC & HS  . insulin aspart  12 Units Subcutaneous TID WC  . insulin glargine  30 Units Subcutaneous QHS  . isosorbide dinitrate  10 mg Oral TID  . levalbuterol  1.25 mg Nebulization TID  . potassium chloride  40 mEq Oral Daily  . rosuvastatin  20 mg Oral q1800  . sodium chloride  10-40 mL Intracatheter Q12H  . spironolactone  12.5 mg Oral Daily    Infusions: . sodium chloride 8 mL/hr at 04/16/15 2238  . milrinone 0.125 mcg/kg/min (04/16/15 2220)    PRN Medications: sodium chloride, acetaminophen, ALPRAZolam, guaiFENesin-dextromethorphan, magnesium hydroxide, meclizine, ondansetron (ZOFRAN) IV,  sodium chloride   Assessment:  1. Acute Systolic Heart Failure 2. Cardiogenic shock on RHC 7/26 3. ICM per cath 7/26  3. PAD  5. R AKA 6. DMII   7. TIA     Plan/Discussion:    64 year old with ICM/Cardiogenic shock noted on RHC 7/26.   Started milrinone 0.25 mcg 7/27 with improved CO-OX.Marland Kitchen Yesterday milrinone cut back to 0.125 mcg. Todays CO-OX 57% . Stop milrinone. Repeat CO-OX in a few hours. Volume status starting to go up. Add 40 mg lasix and increase 25 mg spiro daily. Change hydralazine 12.5 tid/imdut 15 mg daily. Consider losartan tomorrow. Was on 100 mg losartan prior to admit.  Renal function ok. No BB with shock. Add dig 0.125 mg daily.    PT consult.   Length of Stay: 7   CLEGG,AMY NP-C  04/17/2015, 8:26 AM  Advanced Heart Failure Team Pager [phone] (M-F; [place])  Please contact [facility] for night-coverage after hours (4p -7a ) and weekends on [email or web address]  Patient seen and examined with Darrick Grinder, NP. We discussed all aspects of the encounter. I agree with the assessment and plan as stated above.   Feels much much better today. CVP and co-ox improved. I will review cath films with Dr. Burt Knack but I have looked at them and feel medical therapy is best option. Will stop milrinone today and see how she does. Will continue hydralazine and imdur. Add back low dose losartan. We will need to determine if she is candidate for advanced therapies. Keep in SDU to follow co-ox and CVP as we stop milrinone this am.  I don't think she would be candidate for VAD but do think she could handle home milrinone if needed.   Bensimhon, Daniel,MD 9:24 AM

## 2015-04-17 NOTE — Evaluation (Signed)
Physical Therapy Evaluation Patient Details Name: Jenna Brown MRN: [id number] DOB: 12-19-1950 Today's Date: 04/17/2015   History of Present Illness  Patient is a 64 year old female with hyperlipidemia, CVA, hypertension, type 2 diabetes mellitus, CAD, right AKA presented from home with acute shortness of breath, CHF  Clinical Impression  Pt very pleasant, moving well and has all necessary DME at home. Pt RLE prosthesis not currently available and family unable to bring until after noon. Pt near baseline for functional transfers with recommendation for continued OOB activity with nursing supervision. Pt will benefit from additional session of acute therapy to see gait and provide any additional education or gait training in standing. Pt safe for pivot transfers without prosthesis.     Follow Up Recommendations No PT follow up    Equipment Recommendations  None recommended by PT    Recommendations for Other Services       Precautions / Restrictions Precautions Precautions: Fall Precaution Comments: R AKA Restrictions Weight Bearing Restrictions: No      Mobility  Bed Mobility Overal bed mobility: Modified Independent                Transfers Overall transfer level: Needs assistance   Transfers: Squat Pivot Transfers     Squat pivot transfers: Supervision     General transfer comment: supervision for room setup and line management with pt able to pivot to left without assist or cues.   Ambulation/Gait Ambulation/Gait assistance:  (unable as prosthesis not present and pt states she cannot hop)              Stairs            Wheelchair Mobility    Modified Rankin (Stroke Patients Only)       Balance Overall balance assessment: Needs assistance   Sitting balance-Leahy Scale: Good                                       Pertinent Vitals/Pain Pain Assessment: No/denies pain  HR 111 sats 97% on 2L    Home Living  Family/patient expects to be discharged to:: Private residence Living Arrangements: Alone Available Help at Discharge: Family;Available 24 hours/day Type of Home: Apartment Home Access: Level entry     Home Layout: One level Home Equipment: Walker - 2 wheels;Bedside commode;Shower seat;Wheelchair - manual      Prior Function Level of Independence: Independent with assistive device(s)         Comments: pt sits for bathing, dressing and performs most of her day at a WC level. States she can walk about 52' with RW and prosthesis when going out somewhere     Hand Dominance        Extremity/Trunk Assessment   Upper Extremity Assessment: Overall WFL for tasks assessed           Lower Extremity Assessment: Overall WFL for tasks assessed         Communication   Communication: No difficulties  Cognition Arousal/Alertness: Awake/alert Behavior During Therapy: WFL for tasks assessed/performed Overall Cognitive Status: Within Functional Limits for tasks assessed                      General Comments      Exercises General Exercises - Lower Extremity Long Arc Quad: AROM;Left;10 reps;Seated Hip Flexion/Marching: AROM;Seated;Left;10 reps      Assessment/Plan    PT Assessment Patient  needs continued PT services  PT Diagnosis Difficulty walking   PT Problem List Decreased activity tolerance;Decreased balance;Decreased knowledge of use of DME  PT Treatment Interventions Gait training;DME instruction;Functional mobility training;Therapeutic activities;Patient/family education   PT Goals (Current goals can be found in the Care Plan section) Acute Rehab PT Goals Patient Stated Goal: return home PT Goal Formulation: With patient Time For Goal Achievement: 04/24/15 Potential to Achieve Goals: Good    Frequency Min 3X/week   Barriers to discharge   pt lives alone but states she plans to move in with family    Co-evaluation               End of  Session   Activity Tolerance: Patient tolerated treatment well Patient left: in chair;with call bell/phone within reach Nurse Communication: Mobility status;Precautions         Time: [id number] PT Time Calculation (min) (ACUTE ONLY): 12 min   Charges:   PT Evaluation $Initial PT Evaluation Tier I: 1 Procedure     PT G CodesMelford Aase 04/17/2015, 10:56 AM Elwyn Reach, [place]

## 2015-04-17 NOTE — Progress Notes (Signed)
Inpatient Diabetes Program Recommendations  AACE/ADA: New Consensus Statement on Inpatient Glycemic Control (2013)  Target Ranges:  Prepandial:   less than 140 mg/dL      Peak postprandial:   less than 180 mg/dL (1-2 hours)      Critically ill patients:  140 - 180 mg/dL   Results for SYNCLAIRE, KOFF (MRN [id number]) as of 04/17/2015 10:54  Ref. Range 04/16/2015 08:07 04/16/2015 11:07 04/16/2015 16:50 04/16/2015 21:43 04/17/2015 07:35  Glucose-Capillary Latest Ref Range: 65-99 mg/dL 185 (H) 213 (H) 255 (H) 142 (H) 159 (H)   Diabetes history: DM 2 Outpatient Diabetes medications: Metaglip 5-500 mg 2 tablets BID Current orders for Inpatient glycemic control: Lantus 30 units QHS, Novolog Reistant scale, Novolog 12 units TID meal coverage  Inpatient Diabetes Program Recommendations Insulin - Meal Coverage: Glucose spiking at meal times still in the 200's. Please consider increasing Novolog meal coverage to 14 units TID.  Thanks,  Tama Headings RN, MSN, [facility] Inpatient Diabetes Coordinator Team Pager [phone]

## 2015-04-18 ENCOUNTER — Inpatient Hospital Stay (HOSPITAL_COMMUNITY): Payer: Medicare Other

## 2015-04-18 LAB — BASIC METABOLIC PANEL
ANION GAP: 6 (ref 5–15)
BUN: 31 mg/dL — ABNORMAL HIGH (ref 6–20)
CO2: 27 mmol/L (ref 22–32)
Calcium: 8.6 mg/dL — ABNORMAL LOW (ref 8.9–10.3)
Chloride: 103 mmol/L (ref 101–111)
Creatinine, Ser: 1.22 mg/dL — ABNORMAL HIGH (ref 0.44–1.00)
GFR calc non Af Amer: 47 mL/min — ABNORMAL LOW (ref >60)
GFR, EST AFRICAN AMERICAN: 54 mL/min — AB (ref >60)
Glucose, Bld: 177 mg/dL — ABNORMAL HIGH (ref 65–99)
Potassium: 4.9 mmol/L (ref 3.5–5.1)
Sodium: 136 mmol/L (ref 135–145)

## 2015-04-18 LAB — CARBOXYHEMOGLOBIN
CARBOXYHEMOGLOBIN: 1.7 % — AB (ref 0.5–1.5)
Methemoglobin: 1 % (ref 0.0–1.5)
O2 Saturation: 66.5 %
TOTAL HEMOGLOBIN: 8.6 g/dL — AB (ref 12.0–16.0)

## 2015-04-18 LAB — GLUCOSE, CAPILLARY
GLUCOSE-CAPILLARY: 158 mg/dL — AB (ref 65–99)
GLUCOSE-CAPILLARY: 152 mg/dL — AB (ref 65–99)
Glucose-Capillary: 251 mg/dL — ABNORMAL HIGH (ref 65–99)

## 2015-04-18 LAB — MAGNESIUM: Magnesium: 2 mg/dL (ref 1.7–2.4)

## 2015-04-18 MED ORDER — INSULIN GLARGINE 100 UNIT/ML ~~LOC~~ SOLN
40.0000 [IU] | Freq: Every day | SUBCUTANEOUS | Status: DC
  Administered 2015-04-18: 40 [IU] via SUBCUTANEOUS
  Filled 2015-04-18 (×2): qty 0.4

## 2015-04-18 MED ORDER — INSULIN ASPART 100 UNIT/ML ~~LOC~~ SOLN
18.0000 [IU] | Freq: Three times a day (TID) | SUBCUTANEOUS | Status: DC
  Administered 2015-04-19 (×2): 18 [IU] via SUBCUTANEOUS

## 2015-04-18 NOTE — Progress Notes (Signed)
[facility] TEAM 1 - Stepdown/ICU TEAM Progress Note  Jenna Brown [id number] DOB: 11/18/1950 DOA: 04/10/2015 PCP: [name], MD  Admit HPI / Brief Narrative: 63-year-[name], HLD, HTN, CAD Diabetes Type 2 uncontrolled, CVA, Rt AKA   Presented from home with acute shortness of breath. History was obtained from the patient who reported that she has been having worsening shortness of breath for last 3 days. She has noticed worsening orthopnea, PND, mostly sitting up with 4 pillows, has noticed weight gain of 6 lbs (unable to tell me the duration), swelling in her left leg. However this morning, patient had difficult time catching her breath and EMS was called. Per EMS, O2 sats were 85% on room air and she was placed on NRB mask. She denied any fevers, chills, productive cough or any wheezing. She did have discomfort in her left upper abdomen at the time of triage however denies any abdominal pain at this time. ER workup/EDP recommendations reviewed, patient was placed on BiPAP in ED. She was evaluated by cardiology in ED, Dr Acie Fredrickson recommended pulmonary critical care evaluation.  ER workup showed sodium 137, BUN 26, creatinine 1.49, glucose 493, BNP 395.3, troponin 0.15 WBCs 11.5, hemoglobin 10.9 Elevated d-dimer of 0.85   HPI/Subjective: 7/30 A/O 4, negative SOB, negative CP, negative N/V. Sitting in chair on room air comfortable. States smoked 1/2 PPD 20 years  Assessment/Plan: Acute respiratory failure with hypoxia:  -multifactorial to include fluid overload, NSTEMI, and CAP? -Strict in and out since admission -6.5L -Daily a.m. Admission Weight= 72.5 kg            7/29 weight standing = 71.9 kg  -completed seven-day regimen antibiotics -Xopenex nebulizer TID -Titrate O2 to maintain SPO2 89-93% -BiPAP PRN  NSTEMI  -Echocardiogram; LVEF= 20% see results below  -S/P Cardiac catheterization showing three-vessel disease; per cardiothoracic surgery not  candidate for CABG or catheterization; medical management  Acute Systolic CHF   -See acute respiratory failure -Digoxin 0.125 mg daily -Furosemide 40 mg daily -Hydralazine 12.5 mg TID -Imdur 15 mg daily -Losartan 12.5 mg daily -Spironolactone 25 mg daily  Hypertension -See acute systolic CHF   Peripheral vascular disease  - continue aspirin 81 mg daily  Hyperlipidemia -Crestor 20 mg daily   DM Type II (diabetes mellitus, type 2) uncontrolled - 7/22 hemoglobin A1c= 11.3 -Increase Lantus to 40 units daily -Continue resistant SSI -Increase NovoLog 18 units QAC  Acute renal failure ( Unknown baseline)  -Cr stable -Hold all nephrotoxic medication -Hold metformin, HCTZ, ibuprofen     Code Status: FULL Family Communication: No family present at time of exam Disposition Plan:  Per CHF team     Consultants: Dr. Wonda Cheng Nahser (cardiology) [facility]   Procedure/Significant Events: 7/23 echocardiogram;- LVEF=20%. The entire mid-ventricle and apex are akinetic.(could be a Takotsubo ventricle). 7/25 Rt/Lt heart cardiac catheterization-;Prox LAD to Mid LAD lesion, 90% stenosed. -Ost Cx to Mid Cx lesion, 95% stenosed.-Ost 2nd Mrg to 2nd Mrg lesion, 80% stenosed. -Mid Cx lesion, 90% stenosed.-Prox RCA lesion, 60% stenosed.-Mid RCA lesion, 50% stenosed. 7/30 CXR; when compared to CXR 7/22 significant improvement in pulmonary edema   Culture 7/22 urine NGTD 7/22 blood left forearm/antecubital NGTD   Antibiotics: Azithromycin 7/22>> stopped 7/28 Ceftriaxone 7/22>> stopped 7/28  DVT prophylaxis: Subcutaneous heparin   Devices    LINES / TUBES:      Continuous Infusions: . sodium chloride Stopped (04/17/15 0911)    Objective: VITAL SIGNS: Temp: 97.7 F (36.5  C) (07/30 1100) Temp Source: Oral (07/30 1100) BP: 105/64 mmHg (07/30 1200) Pulse Rate: 98 (07/30 1200) SPO2; FIO2:   Intake/Output Summary (Last 24 hours) at 04/18/15  1553 Last data filed at 04/18/15 1200  Gross per 24 hour  Intake    420 ml  Output   1795 ml  Net  -1375 ml     Exam: General: A/O 4, sitting in bed comfortably, No acute respiratory distress Eyes: Negative headache, eye pain, double vision, negative scleral hemorrhage ENT: Negative Runny nose, negative ear pain, negative tinnitus, negative gingival bleeding Neck:  Negative scars, masses, torticollis, lymphadenopathy, JVD Lungs: Clear to auscultation; decreased breath sounds LLL, mild crackles RLL, negative wheezing  Cardiovascular: Tachycardic, Regular rhythm without murmur gallop or rub normal S1 and S2 Abdomen:negative abdominal pain, negative dysphagia, Nontender, nondistended, soft, bowel sounds positive, no rebound, no ascites, no appreciable mass Extremities: No significant cyanosis, clubbing, or edema left lower extremities, right AKA negative ulcers/lesions Psychiatric:  Negative depression, negative anxiety, negative fatigue, negative mania  Neurologic:  Cranial nerves II through XII intact, tongue/uvula midline, all extremities muscle strength 5/5, sensation intact throughout,dysarthria, negative expressive aphasia, negative receptive aphasia.     Data Reviewed: Basic Metabolic Panel:  Recent Labs Lab 04/14/15 0600 04/15/15 0235 04/16/15 0500 04/17/15 0255 04/18/15 0400  NA 137 137 138 136 136  K 4.0 3.8 4.2 4.6 4.9  CL 104 105 105 102 103  CO2 23 23 27 27 27   GLUCOSE 162* 114* 139* 175* 177*  BUN 23* 25* 30* 30* 31*  CREATININE 1.10* 1.36* 1.36* 1.22* 1.22*  CALCIUM 8.8* 8.7* 8.7* 8.9 8.6*  MG 2.0 1.8 1.8 2.1 2.0   Liver Function Tests: No results for input(s): AST, ALT, ALKPHOS, BILITOT, PROT, ALBUMIN in the last 168 hours. No results for input(s): LIPASE, AMYLASE in the last 168 hours. No results for input(s): AMMONIA in the last 168 hours. CBC:  Recent Labs Lab 04/12/15 0450 04/12/15 1140 04/13/15 0225  WBC 9.1 8.9 9.1  HGB 9.9* 10.1* 9.9*  HCT  31.2* 31.4* 30.9*  MCV 79.2 79.7 79.6  PLT 380 367 340   Cardiac Enzymes:  Recent Labs Lab 04/12/15 0943  TROPONINI 0.27*   BNP (last 3 results)  Recent Labs  04/10/15 0850  BNP 395.3*    ProBNP (last 3 results) No results for input(s): PROBNP in the last 8760 hours.  CBG:  Recent Labs Lab 04/17/15 1123 04/17/15 1637 04/17/15 2124 04/18/15 0812 04/18/15 1130  GLUCAP 203* 221* 198* 158* 251*    Recent Results (from the past 240 hour(s))  Urine culture     Status: None   Collection Time: 04/10/15 12:48 PM  Result Value Ref Range Status   Specimen Description URINE, RANDOM  Final   Special Requests NONE  Final   Culture NO GROWTH 1 DAY  Final   Report Status 04/11/2015 FINAL  Final  Culture, blood (routine x 2)     Status: None   Collection Time: 04/10/15  3:15 PM  Result Value Ref Range Status   Specimen Description BLOOD LEFT FOREARM  Final   Special Requests BOTTLES DRAWN AEROBIC ONLY 5CC  Final   Culture NO GROWTH 5 DAYS  Final   Report Status 04/15/2015 FINAL  Final  Culture, blood (routine x 2)     Status: None   Collection Time: 04/10/15  3:25 PM  Result Value Ref Range Status   Specimen Description BLOOD LEFT ANTECUBITAL  Final   Special Requests BOTTLES DRAWN  AEROBIC AND ANAEROBIC 5CC 5CC  Final   Culture NO GROWTH 5 DAYS  Final   Report Status 04/15/2015 FINAL  Final  MRSA PCR Screening     Status: None   Collection Time: 04/13/15 10:58 AM  Result Value Ref Range Status   MRSA by PCR NEGATIVE NEGATIVE Final    Comment:        The GeneXpert MRSA Assay (FDA approved for NASAL specimens only), is one component of a comprehensive MRSA colonization surveillance program. It is not intended to diagnose MRSA infection nor to guide or monitor treatment for MRSA infections.      Studies:  Recent x-ray studies have been reviewed in detail by the Attending Physician  Scheduled Meds:  Scheduled Meds: . antiseptic oral rinse  7 mL Mouth Rinse  BID  . aspirin  81 mg Oral Daily  . digoxin  0.125 mg Oral Daily  . furosemide  40 mg Oral Daily  . heparin subcutaneous  5,000 Units Subcutaneous 3 times per day  . hydrALAZINE  12.5 mg Oral TID  . insulin aspart  0-20 Units Subcutaneous TID AC & HS  . insulin aspart  15 Units Subcutaneous TID WC  . insulin glargine  35 Units Subcutaneous QHS  . isosorbide mononitrate  15 mg Oral Daily  . levalbuterol  1.25 mg Nebulization TID  . losartan  12.5 mg Oral Daily  . potassium chloride  40 mEq Oral Daily  . rosuvastatin  20 mg Oral q1800  . sodium chloride  10-40 mL Intracatheter Q12H  . spironolactone  25 mg Oral Daily    Time spent on care of this patient: 40 mins   Cathalina Barcia, Geraldo Docker , MD  [facility] Office  [phone] Pager - [phone]  On-Call/Text Page:      [email or web address]      password [email or web address]  If 7PM-7AM, please contact night-coverage [email or web address] Password [email or web address] 04/18/2015, 3:53 PM   LOS: 8 days   Care during the described time interval was provided by me .  I have reviewed this patient's available data, including medical history, events of note, physical examination, and all test results as part of my evaluation. I have personally reviewed and interpreted all radiology studies.   Dia Crawford, MD [phone] Pager

## 2015-04-18 NOTE — Progress Notes (Signed)
Patient ID: Jenna Brown, female   DOB: April 19, 1951, 64 y.o.   MRN: [id number]   Subjective:    No complaints  Sitting at edge of bed  No dyspne Coox 66%       RHC/LHC 04/14/15  Prox LAD to Mid LAD lesion, 90% stenosed.  Ost Cx to Mid Cx lesion, 95% stenosed.  Ost 2nd Mrg to 2nd Mrg lesion, 80% stenosed.  Mid Cx lesion, 90% stenosed.  Prox RCA lesion, 60% stenosed.  Mid RCA lesion, 50% stenosed.   Objective:   Weight Range:  Vital Signs:   Temp:  [98 F (36.7 C)-98.7 F (37.1 C)] 98 F (36.7 C) (07/30 0700) Pulse Rate:  [97-111] 97 (07/30 0800) Resp:  [16-29] 19 (07/30 0800) BP: (92-117)/(45-84) 105/60 mmHg (07/30 0800) SpO2:  [93 %-100 %] 100 % (07/30 0830) Weight:  [71.9 kg (158 lb 8.2 oz)] 71.9 kg (158 lb 8.2 oz) (07/30 0500) Last BM Date: 04/16/15  Weight change: Filed Weights   04/16/15 0315 04/17/15 0405 04/18/15 0500  Weight: 76.1 kg (167 lb 12.3 oz) 71.9 kg (158 lb 8.2 oz) 71.9 kg (158 lb 8.2 oz)    Intake/Output:   Intake/Output Summary (Last 24 hours) at 04/18/15 1034 Last data filed at 04/18/15 0924  Gross per 24 hour  Intake    540 ml  Output   1525 ml  Net   -985 ml    CVP 7   Physical Exam: General: Well developed, well nourished, female in no acute distress. Head: Normocephalic, atraumatic. Neck: Supple without bruits, JVD 6-7 cm. Lungs: Resp regular and unlabored, rales w/ decreased BS bases. Heart: Distant RRR, S1, S2, + S3, S4, or murmur; no rub. Abdomen: Soft, non-tender, non-distended, BS + x 4.  Extremities: No clubbing, cyanosis, no L edema. S/p RLE amputation.RUE PICC  Neuro: Alert and oriented X 3. Moves all extremities spontaneously. Psych: Normal affect  Telemetry: Sinus Tach 110 Labs: Basic Metabolic Panel:  Recent Labs Lab 04/14/15 0600 04/15/15 0235 04/16/15 0500 04/17/15 0255 04/18/15 0400  NA 137 137 138 136 136  K 4.0 3.8 4.2 4.6 4.9  CL 104 105 105 102 103  CO2 23 23 27 27 27   GLUCOSE 162* 114*  139* 175* 177*  BUN 23* 25* 30* 30* 31*  CREATININE 1.10* 1.36* 1.36* 1.22* 1.22*  CALCIUM 8.8* 8.7* 8.7* 8.9 8.6*  MG 2.0 1.8 1.8 2.1 2.0     CBC:  Recent Labs Lab 04/12/15 0450 04/12/15 1140 04/13/15 0225  WBC 9.1 8.9 9.1  HGB 9.9* 10.1* 9.9*  HCT 31.2* 31.4* 30.9*  MCV 79.2 79.7 79.6  PLT 380 367 340    Cardiac Enzymes:  Recent Labs Lab 04/12/15 0943  TROPONINI 0.27*    BNP: BNP (last 3 results)  Recent Labs  04/10/15 0850  BNP 395.3*       Other results:    Medications:     Scheduled Medications: . antiseptic oral rinse  7 mL Mouth Rinse BID  . aspirin  81 mg Oral Daily  . digoxin  0.125 mg Oral Daily  . furosemide  40 mg Oral Daily  . heparin subcutaneous  5,000 Units Subcutaneous 3 times per day  . hydrALAZINE  12.5 mg Oral TID  . insulin aspart  0-20 Units Subcutaneous TID AC & HS  . insulin aspart  15 Units Subcutaneous TID WC  . insulin glargine  35 Units Subcutaneous QHS  . isosorbide mononitrate  15 mg Oral Daily  . levalbuterol  1.25 mg Nebulization TID  . losartan  12.5 mg Oral Daily  . potassium chloride  40 mEq Oral Daily  . rosuvastatin  20 mg Oral q1800  . sodium chloride  10-40 mL Intracatheter Q12H  . spironolactone  25 mg Oral Daily    Infusions: . sodium chloride Stopped (04/17/15 0911)    PRN Medications: sodium chloride, acetaminophen, ALPRAZolam, magnesium hydroxide, meclizine, ondansetron (ZOFRAN) IV, sodium chloride   Assessment:  1. Acute Systolic Heart Failure 2. Cardiogenic shock on RHC 7/26 3. ICM per cath 7/26  3. PAD  5. R AKA 6. DMII   7. TIA     Plan/Discussion:    64 year old with ICM/Cardiogenic shock noted on RHC 7/26.   Started milrinone 0.25 mcg 7/27 with improved CO-OX.. Off milrinone now 24 hrs  . Coox 66%  Meds adjusted by DB yesterday  Needs f/u CXR still with effusions/rales on exam He indicated may not be a candidate for advanced Rx's but home milrinone possibly. Currently Coox and  CVP stable off inotropes  HR high but no beta blocker given cardiogenic Shock and low output    Length of Stay: 8   Jenkins Rouge NP-C  04/18/2015, 10:34 AM   10:34 AM

## 2015-04-19 DIAGNOSIS — E875 Hyperkalemia: Secondary | ICD-10-CM

## 2015-04-19 LAB — BASIC METABOLIC PANEL
ANION GAP: 7 (ref 5–15)
BUN: 30 mg/dL — ABNORMAL HIGH (ref 6–20)
CALCIUM: 9 mg/dL (ref 8.9–10.3)
CHLORIDE: 104 mmol/L (ref 101–111)
CO2: 26 mmol/L (ref 22–32)
CREATININE: 1.19 mg/dL — AB (ref 0.44–1.00)
GFR calc Af Amer: 56 mL/min — ABNORMAL LOW (ref >60)
GFR, EST NON AFRICAN AMERICAN: 48 mL/min — AB (ref >60)
Glucose, Bld: 241 mg/dL — ABNORMAL HIGH (ref 65–99)
Potassium: 5.1 mmol/L (ref 3.5–5.1)
Sodium: 137 mmol/L (ref 135–145)

## 2015-04-19 LAB — CARBOXYHEMOGLOBIN
Carboxyhemoglobin: 2.2 % — ABNORMAL HIGH (ref 0.5–1.5)
Methemoglobin: 0.9 % (ref 0.0–1.5)
O2 SAT: 79.4 %
TOTAL HEMOGLOBIN: 9.1 g/dL — AB (ref 12.0–16.0)

## 2015-04-19 LAB — GLUCOSE, CAPILLARY
GLUCOSE-CAPILLARY: 185 mg/dL — AB (ref 65–99)
GLUCOSE-CAPILLARY: 132 mg/dL — AB (ref 65–99)
Glucose-Capillary: 201 mg/dL — ABNORMAL HIGH (ref 65–99)
Glucose-Capillary: 174 mg/dL — ABNORMAL HIGH (ref 65–99)
Glucose-Capillary: 229 mg/dL — ABNORMAL HIGH (ref 65–99)

## 2015-04-19 LAB — MAGNESIUM: Magnesium: 1.9 mg/dL (ref 1.7–2.4)

## 2015-04-19 MED ORDER — INSULIN ASPART 100 UNIT/ML ~~LOC~~ SOLN
22.0000 [IU] | Freq: Three times a day (TID) | SUBCUTANEOUS | Status: DC
  Administered 2015-04-19 – 2015-04-22 (×7): 22 [IU] via SUBCUTANEOUS

## 2015-04-19 MED ORDER — SODIUM POLYSTYRENE SULFONATE 15 GM/60ML PO SUSP: 15.0000 g | Freq: Once | ORAL | Status: DC

## 2015-04-19 MED ORDER — INSULIN GLARGINE 100 UNIT/ML ~~LOC~~ SOLN
45.0000 [IU] | Freq: Every day | SUBCUTANEOUS | Status: DC
  Administered 2015-04-19 – 2015-04-21 (×3): 45 [IU] via SUBCUTANEOUS
  Filled 2015-04-19 (×4): qty 0.45

## 2015-04-19 MED ORDER — BENZONATATE 100 MG PO CAPS
200.0000 mg | ORAL_CAPSULE | Freq: Three times a day (TID) | ORAL | Status: DC | PRN
Start: 2015-04-19 — End: 2015-04-22
  Administered 2015-04-19 – 2015-04-20 (×2): 200 mg via ORAL
  Filled 2015-04-19 (×2): qty 2

## 2015-04-19 NOTE — Progress Notes (Signed)
Patient ID: Jenna Brown, female   DOB: [birth year], 64 y.o.   MRN: [id number]   Subjective:    No dyspnea laying flat  No Chest pain  Weight up but negative balance 1L CVP 7  Co-ox 70% off milrinone       RHC/LHC 04/14/15  Prox LAD to Mid LAD lesion, 90% stenosed.  Ost Cx to Mid Cx lesion, 95% stenosed.  Ost 2nd Mrg to 2nd Mrg lesion, 80% stenosed.  Mid Cx lesion, 90% stenosed.  Prox RCA lesion, 60% stenosed.  Mid RCA lesion, 50% stenosed.   Objective:   Weight Range:  Vital Signs:   Temp:  [97.5 F (36.4 C)-98 F (36.7 C)] 97.9 F (36.6 C) (07/31 0812) Pulse Rate:  [98-108] 107 (07/31 0812) Resp:  [16-20] 18 (07/31 0812) BP: (105-151)/(53-120) 134/120 mmHg (07/31 0812) SpO2:  [90 %-100 %] 98 % (07/31 0812) Weight:  [74.3 kg (163 lb 12.8 oz)] 74.3 kg (163 lb 12.8 oz) (07/31 0300) Last BM Date: 04/18/15  Weight change: Filed Weights   04/17/15 0405 04/18/15 0500 04/19/15 0300  Weight: 71.9 kg (158 lb 8.2 oz) 71.9 kg (158 lb 8.2 oz) 74.3 kg (163 lb 12.8 oz)    Intake/Output:   Intake/Output Summary (Last 24 hours) at 04/19/15 0855 Last data filed at 04/19/15 [id number]  Gross per 24 hour  Intake    700 ml  Output   1770 ml  Net  -1070 ml    CVP 7   Physical Exam: General: Well developed, well nourished, female in no acute distress. Head: Normocephalic, atraumatic. Neck: Supple without bruits, JVD 6-7 cm. Lungs: Resp regular and unlabored, rales w/ decreased BS bases. Heart: Distant RRR, S1, S2, + S3, S4, or murmur; no rub. Abdomen: Soft, non-tender, non-distended, BS + x 4.  Extremities: No clubbing, cyanosis, no L edema. S/p RLE amputation.RUE PICC  Neuro: Alert and oriented X 3. Moves all extremities spontaneously. Psych: Normal affect  Telemetry: Sinus Tach 110 Labs: Basic Metabolic Panel:  Recent Labs Lab 04/15/15 0235 04/16/15 0500 04/17/15 0255 04/18/15 0400 04/19/15 0647  NA 137 138 136 136 137  K 3.8 4.2 4.6 4.9 5.1  CL 105 105 102  103 104  CO2 23 27 27 27 26   GLUCOSE 114* 139* 175* 177* 241*  BUN 25* 30* 30* 31* 30*  CREATININE 1.36* 1.36* 1.22* 1.22* 1.19*  CALCIUM 8.7* 8.7* 8.9 8.6* 9.0  MG 1.8 1.8 2.1 2.0 1.9     CBC:  Recent Labs Lab 04/12/15 1140 04/13/15 0225  WBC 8.9 9.1  HGB 10.1* 9.9*  HCT 31.4* 30.9*  MCV 79.7 79.6  PLT 367 340    Cardiac Enzymes:  Recent Labs Lab 04/12/15 0943  TROPONINI 0.27*    BNP: BNP (last 3 results)  Recent Labs  04/10/15 0850  BNP 395.3*       Other results:    Medications:     Scheduled Medications: . antiseptic oral rinse  7 mL Mouth Rinse BID  . aspirin  81 mg Oral Daily  . digoxin  0.125 mg Oral Daily  . furosemide  40 mg Oral Daily  . heparin subcutaneous  5,000 Units Subcutaneous 3 times per day  . hydrALAZINE  12.5 mg Oral TID  . insulin aspart  0-20 Units Subcutaneous TID AC & HS  . insulin aspart  18 Units Subcutaneous TID WC  . insulin glargine  40 Units Subcutaneous QHS  . isosorbide mononitrate  15 mg Oral Daily  . levalbuterol  1.25 mg Nebulization TID  . losartan  12.5 mg Oral Daily  . potassium chloride  40 mEq Oral Daily  . rosuvastatin  20 mg Oral q1800  . sodium chloride  10-40 mL Intracatheter Q12H  . sodium polystyrene  15 g Oral Once  . spironolactone  25 mg Oral Daily    Infusions: . sodium chloride Stopped (04/17/15 0911)    PRN Medications: sodium chloride, acetaminophen, ALPRAZolam, magnesium hydroxide, meclizine, ondansetron (ZOFRAN) IV, sodium chloride   Assessment:  1. Acute Systolic Heart Failure 2. Cardiogenic shock on RHC 7/26 3. ICM per cath 7/26  3. PAD  5. R AKA 6. DMII   7. TIA     Plan/Discussion:    64 year old with ICM/Cardiogenic shock noted on RHC 7/26.   Started milrinone 0.25 mcg 7/27 with improved CO-OX.. Off milrinone now 48  hrs   EF 20% by echo 04/11/15  RV ok  . Coox 66%  Meds adjusted by DB Friday   CXR with atelectasis no effusion  He indicated may not be a candidate  for advanced Rx's but home milrinone possibly. Currently Coox and CVP stable off inotropes  HR high but no beta blocker given cardiogenic Shock and low output consider adding low dose coreg in am    Length of Stay: 9   Jenkins Rouge NP-C  04/19/2015, 8:55 AM   8:55 AM

## 2015-04-19 NOTE — Progress Notes (Signed)
Pt refuses BiPAP at night. When asked patient denies using any type of non-invasive ventilation at home. Pt states that she does not use a Cpap mask or oxygen to sleep. Pt is stable on room air, no distress noted

## 2015-04-19 NOTE — Progress Notes (Signed)
[place] TEAM 1 - Stepdown/ICU TEAM Progress Note  Jenna Brown [id number] DOB: 02/16/1951 DOA: 04/10/2015 PCP: [name], MD  Admit HPI / Brief Narrative: 63-year-[name], HLD, HTN, CAD Diabetes Type 2 uncontrolled, CVA, Rt AKA   Presented from home with acute shortness of breath. History was obtained from the patient who reported that she has been having worsening shortness of breath for last 3 days. She has noticed worsening orthopnea, PND, mostly sitting up with 4 pillows, has noticed weight gain of 6 lbs (unable to tell me the duration), swelling in her left leg. However this morning, patient had difficult time catching her breath and EMS was called. Per EMS, O2 sats were 85% on room air and she was placed on NRB mask. She denied any fevers, chills, productive cough or any wheezing. She did have discomfort in her left upper abdomen at the time of triage however denies any abdominal pain at this time. ER workup/EDP recommendations reviewed, patient was placed on BiPAP in ED. She was evaluated by cardiology in ED, Dr Acie Fredrickson recommended pulmonary critical care evaluation.  ER workup showed sodium 137, BUN 26, creatinine 1.49, glucose 493, BNP 395.3, troponin 0.15 WBCs 11.5, hemoglobin 10.9 Elevated d-dimer of 0.85   HPI/Subjective: 7/31 A/O 4, negative SOB, negative CP, negative N/V. Sitting in bed  comfortable.    Assessment/Plan: Acute respiratory failure with hypoxia:  -multifactorial to include fluid overload, NSTEMI, and CAP? -Strict in and out since admission -6.9L -Daily a.m. Admission Weight= 72.5 kg            7/31 weight bed  = 74.3 kg  -completed seven-day regimen antibiotics -Xopenex nebulizer TID -Titrate O2 to maintain SPO2 89-93% -BiPAP PRN  NSTEMI  -Echocardiogram; LVEF= 20% see results below  -S/P Cardiac catheterization showing three-vessel disease; per cardiothoracic surgery not candidate for CABG or catheterization; medical  management  Acute Systolic CHF   -See acute respiratory failure -Digoxin 0.125 mg daily -Furosemide 40 mg daily -Hydralazine 12.5 mg TID -Imdur 15 mg daily -Losartan 12.5 mg daily -Spironolactone 25 mg daily  Hypertension -See acute systolic CHF   Peripheral vascular disease  - continue aspirin 81 mg daily  Hyperlipidemia -Crestor 20 mg daily   DM Type II (diabetes mellitus, type 2) uncontrolled - 7/22 hemoglobin A1c= 11.3 -Increase Lantus to 45 units daily -Continue resistant SSI -Increase NovoLog 22 units QAC  Hyperkalemia -Patient on 2 medications which can cause hyperkalemia, monitor closely -Patient given dose of Lasix by cardiology -Potassium held  Acute renal failure ( Unknown baseline)  -Cr stable -Hold all nephrotoxic medication -Hold metformin, HCTZ, ibuprofen    Code Status: FULL Family Communication: No family present at time of exam Disposition Plan:  Per CHF team     Consultants: Dr. Wonda Cheng Nahser (cardiology) Dr.Wesam Kathryne Sharper [facility] M)   Procedure/Significant Events: 7/23 echocardiogram;- LVEF=20%. The entire mid-ventricle and apex are akinetic.(could be a Takotsubo ventricle). 7/25 Rt/Lt heart cardiac catheterization-;Prox LAD to Mid LAD lesion, 90% stenosed. -Ost Cx to Mid Cx lesion, 95% stenosed.-Ost 2nd Mrg to 2nd Mrg lesion, 80% stenosed. -Mid Cx lesion, 90% stenosed.-Prox RCA lesion, 60% stenosed.-Mid RCA lesion, 50% stenosed. 7/30 CXR; when compared to CXR 7/22 significant improvement in pulmonary edema   Culture 7/22 urine NGTD 7/22 blood left forearm/antecubital NGTD   Antibiotics: Azithromycin 7/22>> stopped 7/28 Ceftriaxone 7/22>> stopped 7/28  DVT prophylaxis: Subcutaneous heparin   Devices    LINES / TUBES:      Continuous Infusions: .  sodium chloride Stopped (04/17/15 0911)    Objective: VITAL SIGNS: Temp: 98.1 F (36.7 C) (07/31 1650) Temp Source: Oral (07/31 1650) BP: 118/57 mmHg (07/31  1650) Pulse Rate: 102 (07/31 1650) SPO2; FIO2:   Intake/Output Summary (Last 24 hours) at 04/19/15 1708 Last data filed at 04/19/15 1300  Gross per 24 hour  Intake    480 ml  Output   1350 ml  Net   -870 ml     Exam: General: A/O 4, sitting in bed comfortably, No acute respiratory distress Eyes: Negative headache, eye pain, double vision, negative scleral hemorrhage ENT: Negative Runny nose, negative ear pain, negative tinnitus, negative gingival bleeding Neck:  Negative scars, masses, torticollis, lymphadenopathy, JVD Lungs: Clear to auscultation; decreased breath sounds LLL, mild crackles RLL, negative wheezing  Cardiovascular: Tachycardic, Regular rhythm without murmur gallop or rub normal S1 and S2 Abdomen:negative abdominal pain, negative dysphagia, Nontender, nondistended, soft, bowel sounds positive, no rebound, no ascites, no appreciable mass Extremities: No significant cyanosis, clubbing, or edema left lower extremities, right AKA negative ulcers/lesions Psychiatric:  Negative depression, negative anxiety, negative fatigue, negative mania  Neurologic:  Cranial nerves II through XII intact, tongue/uvula midline, all extremities muscle strength 5/5, sensation intact throughout,dysarthria, negative expressive aphasia, negative receptive aphasia.     Data Reviewed: Basic Metabolic Panel:  Recent Labs Lab 04/15/15 0235 04/16/15 0500 04/17/15 0255 04/18/15 0400 04/19/15 0647  NA 137 138 136 136 137  K 3.8 4.2 4.6 4.9 5.1  CL 105 105 102 103 104  CO2 23 27 27 27 26   GLUCOSE 114* 139* 175* 177* 241*  BUN 25* 30* 30* 31* 30*  CREATININE 1.36* 1.36* 1.22* 1.22* 1.19*  CALCIUM 8.7* 8.7* 8.9 8.6* 9.0  MG 1.8 1.8 2.1 2.0 1.9   Liver Function Tests: No results for input(s): AST, ALT, ALKPHOS, BILITOT, PROT, ALBUMIN in the last 168 hours. No results for input(s): LIPASE, AMYLASE in the last 168 hours. No results for input(s): AMMONIA in the last 168  hours. CBC:  Recent Labs Lab 04/13/15 0225  WBC 9.1  HGB 9.9*  HCT 30.9*  MCV 79.6  PLT 340   Cardiac Enzymes: No results for input(s): CKTOTAL, CKMB, CKMBINDEX, TROPONINI in the last 168 hours. BNP (last 3 results)  Recent Labs  04/10/15 0850  BNP 395.3*    ProBNP (last 3 results) No results for input(s): PROBNP in the last 8760 hours.  CBG:  Recent Labs Lab 04/18/15 1130 04/18/15 1708 04/18/15 2142 04/19/15 0811 04/19/15 1212  GLUCAP 251* 152* 185* 201* 174*    Recent Results (from the past 240 hour(s))  Urine culture     Status: None   Collection Time: 04/10/15 12:48 PM  Result Value Ref Range Status   Specimen Description URINE, RANDOM  Final   Special Requests NONE  Final   Culture NO GROWTH 1 DAY  Final   Report Status 04/11/2015 FINAL  Final  Culture, blood (routine x 2)     Status: None   Collection Time: 04/10/15  3:15 PM  Result Value Ref Range Status   Specimen Description BLOOD LEFT FOREARM  Final   Special Requests BOTTLES DRAWN AEROBIC ONLY 5CC  Final   Culture NO GROWTH 5 DAYS  Final   Report Status 04/15/2015 FINAL  Final  Culture, blood (routine x 2)     Status: None   Collection Time: 04/10/15  3:25 PM  Result Value Ref Range Status   Specimen Description BLOOD LEFT ANTECUBITAL  Final  Special Requests BOTTLES DRAWN AEROBIC AND ANAEROBIC 5CC 5CC  Final   Culture NO GROWTH 5 DAYS  Final   Report Status 04/15/2015 FINAL  Final  MRSA PCR Screening     Status: None   Collection Time: 04/13/15 10:58 AM  Result Value Ref Range Status   MRSA by PCR NEGATIVE NEGATIVE Final    Comment:        The GeneXpert MRSA Assay (FDA approved for NASAL specimens only), is one component of a comprehensive MRSA colonization surveillance program. It is not intended to diagnose MRSA infection nor to guide or monitor treatment for MRSA infections.      Studies:  Recent x-ray studies have been reviewed in detail by the Attending  Physician  Scheduled Meds:  Scheduled Meds: . antiseptic oral rinse  7 mL Mouth Rinse BID  . aspirin  81 mg Oral Daily  . digoxin  0.125 mg Oral Daily  . furosemide  40 mg Oral Daily  . heparin subcutaneous  5,000 Units Subcutaneous 3 times per day  . hydrALAZINE  12.5 mg Oral TID  . insulin aspart  0-20 Units Subcutaneous TID AC & HS  . insulin aspart  22 Units Subcutaneous TID WC  . insulin glargine  45 Units Subcutaneous QHS  . isosorbide mononitrate  15 mg Oral Daily  . levalbuterol  1.25 mg Nebulization TID  . losartan  12.5 mg Oral Daily  . rosuvastatin  20 mg Oral q1800  . sodium chloride  10-40 mL Intracatheter Q12H  . spironolactone  25 mg Oral Daily    Time spent on care of this patient: 40 mins   WOODS, Geraldo Docker , MD  [facility] Office  [phone] Pager - [phone]  On-Call/Text Page:      [email or web address]      password [email or web address]  If 7PM-7AM, please contact night-coverage [email or web address] Password [email or web address] 04/19/2015, 5:08 PM   LOS: 9 days   Care during the described time interval was provided by me .  I have reviewed this patient's available data, including medical history, events of note, physical examination, and all test results as part of my evaluation. I have personally reviewed and interpreted all radiology studies.   Dia Crawford, MD [phone] Pager

## 2015-04-20 LAB — GLUCOSE, CAPILLARY
GLUCOSE-CAPILLARY: 68 mg/dL (ref 65–99)
GLUCOSE-CAPILLARY: 266 mg/dL — AB (ref 65–99)
Glucose-Capillary: 115 mg/dL — ABNORMAL HIGH (ref 65–99)
Glucose-Capillary: 101 mg/dL — ABNORMAL HIGH (ref 65–99)
Glucose-Capillary: 77 mg/dL (ref 65–99)

## 2015-04-20 LAB — BASIC METABOLIC PANEL
ANION GAP: 6 (ref 5–15)
BUN: 27 mg/dL — ABNORMAL HIGH (ref 6–20)
CO2: 29 mmol/L (ref 22–32)
Calcium: 9.3 mg/dL (ref 8.9–10.3)
Chloride: 105 mmol/L (ref 101–111)
Creatinine, Ser: 1.15 mg/dL — ABNORMAL HIGH (ref 0.44–1.00)
GFR calc Af Amer: 58 mL/min — ABNORMAL LOW (ref >60)
GFR calc non Af Amer: 50 mL/min — ABNORMAL LOW (ref >60)
GLUCOSE: 86 mg/dL (ref 65–99)
POTASSIUM: 4.5 mmol/L (ref 3.5–5.1)
SODIUM: 140 mmol/L (ref 135–145)

## 2015-04-20 LAB — CARBOXYHEMOGLOBIN
Carboxyhemoglobin: 1.8 % — ABNORMAL HIGH (ref 0.5–1.5)
Methemoglobin: 1 % (ref 0.0–1.5)
O2 SAT: 57.3 %
Total hemoglobin: 9.1 g/dL — ABNORMAL LOW (ref 12.0–16.0)

## 2015-04-20 LAB — MAGNESIUM: Magnesium: 1.8 mg/dL (ref 1.7–2.4)

## 2015-04-20 MED ORDER — HYDRALAZINE HCL 25 MG PO TABS
25.0000 mg | ORAL_TABLET | Freq: Three times a day (TID) | ORAL | Status: DC
  Administered 2015-04-20 – 2015-04-22 (×7): 25 mg via ORAL
  Filled 2015-04-20 (×9): qty 1

## 2015-04-20 MED ORDER — ISOSORBIDE MONONITRATE ER 30 MG PO TB24
30.0000 mg | ORAL_TABLET | Freq: Every day | ORAL | Status: DC
  Administered 2015-04-20 – 2015-04-22 (×3): 30 mg via ORAL
  Filled 2015-04-20 (×3): qty 1

## 2015-04-20 MED ORDER — ALTEPLASE 2 MG IJ SOLR
2.0000 mg | Freq: Once | INTRAMUSCULAR | Status: AC
  Administered 2015-04-20: 2 mg
  Filled 2015-04-20: qty 2

## 2015-04-20 MED ORDER — MAGNESIUM SULFATE 2 GM/50ML IV SOLN
2.0000 g | Freq: Once | INTRAVENOUS | Status: AC
  Administered 2015-04-20: 2 g via INTRAVENOUS
  Filled 2015-04-20: qty 50

## 2015-04-20 NOTE — Care Management Important Message (Signed)
Important Message  Patient Details  Name: Jenna Brown MRN: [id number] Date of Birth: 03-Jan-1951   Medicare Important Message Given:  Yes-fourth notification given    Pricilla Handler 04/20/2015, 12:23 PM

## 2015-04-20 NOTE — Progress Notes (Signed)
Pt refuses BiPAP, pt states that she doesn't wear BiPAP at home. RN aware

## 2015-04-20 NOTE — Progress Notes (Signed)
CARDIAC REHAB PHASE I   PRE:  Rate/Rhythm: 117 ST    BP: sitting 102/76 (manual, dinamapp inaccurate)    SaO2: 100 RA  MODE:  Ambulation: 60 ft   POST:  Rate/Rhythm: 141 ST    BP: sitting 128/84     SaO2: 94 RA  Pt eager to walk. HR elevated at rest, denied sx, denied SOB. Donned prosthesis independently. Pt used RW to walk 60 ft with gait belt, assist x2. Followed with recliner. HR increased to 141 ST, pt fatigued, although reluctant to say so. Encouraged her to sit and rest. Once sitting after questioning pt admitted to her "head feeling funny". Also endorses light chest pressure. Rolled pt back to room and left in recliner. Pts head sensation and chest pressure resolved with rest. HR back down. Pt disappointed not to walk back. PT to see today. Will f/u [id number]  Darrick Meigs CES, ACSM 04/20/2015 9:57 AM

## 2015-04-20 NOTE — Evaluation (Signed)
Occupational Therapy Evaluation Patient Details Name: Jenna Brown MRN: [id number] DOB: 08-Sep-1951 Today's Date: 04/20/2015    History of Present Illness Patient is a 64 year old female with hyperlipidemia, CVA, hypertension, type 2 diabetes mellitus, CAD, right AKA presented from home with acute shortness of breath, CHF   Clinical Impression   Prior to admission, pt performed ADL and IADL from a seated position and could transfer independently without her prosthesis.  Currently performing seated ADL with set up and standing and pivot transfers with supervision.  Will follow. Plan is for pt to go to her sisters. She stated "they don't want me living by myself anymore."    Follow Up Recommendations  No OT follow up    Equipment Recommendations  None recommended by OT    Recommendations for Other Services       Precautions / Restrictions Precautions Precautions: Fall Precaution Comments: R AKA with prosthesis Restrictions Weight Bearing Restrictions: No      Mobility Bed Mobility Overal bed mobility: Modified Independent                Transfers   Equipment used: None Transfers: Squat Pivot Transfers     Squat pivot transfers: Supervision     General transfer comment: without walker and prosthesis, performed squat pivot from bed to 3 in 1 and back    Balance Overall balance assessment: Needs assistance   Sitting balance-Leahy Scale: Good       Standing balance-Leahy Scale: Poor                              ADL Overall ADL's : Needs assistance/impaired Eating/Feeding: Independent;Sitting   Grooming: Wash/dry hands;Wash/dry face;Sitting   Upper Body Bathing: Set up;Sitting   Lower Body Bathing: Supervison/ safety;Sit to/from stand   Upper Body Dressing : Set up;Sitting   Lower Body Dressing: Supervision/safety;Sit to/from stand   Toilet Transfer: Squat-pivot;BSC;Supervision/safety   Toileting- Water quality scientist and Hygiene:  Sit to/from stand;Supervision/safety         General ADL Comments: pt with urinary incontinence, bed changed     Vision     Perception     Praxis      Pertinent Vitals/Pain Pain Assessment: No/denies pain     Hand Dominance Right   Extremity/Trunk Assessment Upper Extremity Assessment Upper Extremity Assessment: Overall WFL for tasks assessed   Lower Extremity Assessment Lower Extremity Assessment: Defer to PT evaluation       Communication Communication Communication: No difficulties   Cognition Arousal/Alertness: Awake/alert Behavior During Therapy: WFL for tasks assessed/performed Overall Cognitive Status: Within Functional Limits for tasks assessed                     General Comments       Exercises       Shoulder Instructions      Home Living Family/patient expects to be discharged to:: Private residence Living Arrangements: Alone Available Help at Discharge: Family;Available 24 hours/day Type of Home: Apartment Home Access: Level entry     Home Layout: One level     Bathroom Shower/Tub: Teacher, early years/pre: Standard     Home Equipment: Environmental consultant - 2 wheels;Bedside commode;Shower seat;Wheelchair - manual          Prior Functioning/Environment Level of Independence: Independent with assistive device(s)        Comments: pt sits for bathing, dressing and performs most of her day at a WC level.  States she can walk about 15' with RW and prosthesis when going out somewhere    OT Diagnosis: Generalized weakness   OT Problem List: Impaired balance (sitting and/or standing)   OT Treatment/Interventions: Self-care/ADL training;DME and/or AE instruction;Patient/family education    OT Goals(Current goals can be found in the care plan section) Acute Rehab OT Goals Patient Stated Goal: pt going to stay with her sister at discharge OT Goal Formulation: With patient Time For Goal Achievement: 04/27/15 Potential to Achieve  Goals: Good ADL Goals Pt Will Perform Lower Body Bathing: with set-up;sit to/from stand Pt Will Perform Lower Body Dressing: with set-up;sit to/from stand Pt Will Transfer to Toilet: with modified independence;squat pivot transfer;bedside commode Pt Will Perform Toileting - Clothing Manipulation and hygiene: with modified independence;sit to/from stand  OT Frequency: Min 2X/week   Barriers to D/C:            Co-evaluation              End of Session    Activity Tolerance: Patient tolerated treatment well Patient left: in bed   Time: 1515-1531 OT Time Calculation (min): 16 min Charges:  OT General Charges $OT Visit: 1 Procedure OT Evaluation $Initial OT Evaluation Tier I: 1 Procedure G-Codes:    Malka So 04/20/2015, 3:54 PM

## 2015-04-20 NOTE — Progress Notes (Signed)
[place] TEAM 1 - Stepdown/ICU TEAM Progress Note  Jenna Brown [id number] DOB: 10-Mar-1951 DOA: 04/10/2015 PCP: [name], MD  Admit HPI / Brief Narrative: 64 yo F Hx Noncompliance, HLD, HTN, CAD, Diabetes Type 2 uncontrolled, CVA, and Rt AKA who presented from home with acute shortness of breath. She had been having worsening shortness of breath for 3 days. She noticed weight gain of 6 lbs (unable to tell me the duration), and swelling in her left leg. Per EMS, O2 sats were 85% on room air and she was placed on NRB mask.   The patient was placed on BiPAP in ED. She was evaluated by cardiology who recommended pulmonary critical care evaluation. ER workup showed sodium 137, BUN 26, creatinine 1.49, glucose 493, BNP 395.3, troponin 0.15 WBCs 11.5, hemoglobin 10.9  Elevated d-dimer of 0.85  HPI/Subjective: The patient is resting comfortably in bed at time of visit.  She denies chest pain fevers chills nausea or vomiting.  Assessment/Plan:  Acute respiratory failure with hypoxia  -multifactorial to include fluid overload, NSTEMI, and CAP?  Acute Exacerbation chronic Systolic CHF - Cardiogenic shock -care per CHF Team/Cardiology  -Strict in and out since admission -8L -Admission Weight= 72.5 kg     7/31 weight bed  = 74.3 kg  -Must remain in SDU as CHF team wishes to continue CVP monitoring and is considering resuming milrinone  NSTEMI  -S/P Cardiac catheterization showing three-vessel disease; per TCTS not candidate for CABG or catheterization; medical management  Hypertension -Adjustment of medical therapy per CHF team  Peripheral vascular disease  -continue aspirin 81 mg daily  Hyperlipidemia -Crestor 20 mg daily   DM Type II (diabetes mellitus, type 2) uncontrolled -7/22 A1c 11.3 - CBGs as inpatient currently reasonably controlled  Hyperkalemia -Resolved  Acute renal failure (Unknown baseline)  -Cr stable/improving  Code Status: FULL Family  Communication: No family present at time of exam Disposition Plan:  SDU   Consultants: Dr. Wonda Cheng Nahser (Cardiology) Dr.Wesam Kathryne Sharper (PCCM)   Procedures: 7/23 echocardiogram;- LVEF=20%. The entire mid-ventricle and apex are akinetic.(could be a Takotsubo ventricle). 7/25 Rt/Lt heart cardiac catheterization-;Prox LAD to Mid LAD lesion, 90% stenosed. -Ost Cx to Mid Cx lesion, 95% stenosed.-Ost 2nd Mrg to 2nd Mrg lesion, 80% stenosed. -Mid Cx lesion, 90% stenosed.-Prox RCA lesion, 60% stenosed.-Mid RCA lesion, 50% stenosed. 7/30 CXR; when compared to CXR 7/22 significant improvement in pulmonary edema  Antibiotics: Azithromycin 7/22 > 7/28 Ceftriaxone 7/22 > 7/28  DVT prophylaxis: Subcutaneous heparin  Objective: Blood pressure 138/75, pulse 105, temperature 97.8 F (36.6 C), temperature source Oral, resp. rate 16, height 5\' 1"  (1.549 m), weight 72.1 kg (158 lb 15.2 oz), SpO2 97 %.  Intake/Output Summary (Last 24 hours) at 04/20/15 1135 Last data filed at 04/20/15 0846  Gross per 24 hour  Intake    720 ml  Output   1900 ml  Net  -1180 ml   Exam: General: No acute respiratory distress Lungs: Clear to auscultation bilaterally without wheezes or crackles Cardiovascular: Regular rate and rhythm without murmur gallop or rub normal S1 and S2 Abdomen: Nontender, nondistended, soft, bowel sounds positive, no rebound, no ascites, no appreciable mass Extremities: No significant cyanosis, clubbing, or edema bilateral lower extremities  Data Reviewed: Basic Metabolic Panel:  Recent Labs Lab 04/16/15 0500 04/17/15 0255 04/18/15 0400 04/19/15 0647 04/20/15 0315  NA 138 136 136 137 140  K 4.2 4.6 4.9 5.1 4.5  CL 105 102 103 104 105  CO2 27 27  27 26 29   GLUCOSE 139* 175* 177* 241* 86  BUN 30* 30* 31* 30* 27*  CREATININE 1.36* 1.22* 1.22* 1.19* 1.15*  CALCIUM 8.7* 8.9 8.6* 9.0 9.3  MG 1.8 2.1 2.0 1.9 1.8   Liver Function Tests: No results for input(s): AST, ALT,  ALKPHOS, BILITOT, PROT, ALBUMIN in the last 168 hours. No results for input(s): LIPASE, AMYLASE in the last 168 hours. No results for input(s): AMMONIA in the last 168 hours.   CBC: No results for input(s): WBC, NEUTROABS, HGB, HCT, MCV, PLT in the last 168 hours.  CBG:  Recent Labs Lab 04/19/15 0811 04/19/15 1212 04/19/15 1654 04/19/15 2112 04/20/15 0759  GLUCAP 201* 174* 132* 229* 115*    Recent Results (from the past 240 hour(s))  Urine culture     Status: None   Collection Time: 04/10/15 12:48 PM  Result Value Ref Range Status   Specimen Description URINE, RANDOM  Final   Special Requests NONE  Final   Culture NO GROWTH 1 DAY  Final   Report Status 04/11/2015 FINAL  Final  Culture, blood (routine x 2)     Status: None   Collection Time: 04/10/15  3:15 PM  Result Value Ref Range Status   Specimen Description BLOOD LEFT FOREARM  Final   Special Requests BOTTLES DRAWN AEROBIC ONLY 5CC  Final   Culture NO GROWTH 5 DAYS  Final   Report Status 04/15/2015 FINAL  Final  Culture, blood (routine x 2)     Status: None   Collection Time: 04/10/15  3:25 PM  Result Value Ref Range Status   Specimen Description BLOOD LEFT ANTECUBITAL  Final   Special Requests BOTTLES DRAWN AEROBIC AND ANAEROBIC 5CC 5CC  Final   Culture NO GROWTH 5 DAYS  Final   Report Status 04/15/2015 FINAL  Final  MRSA PCR Screening     Status: None   Collection Time: 04/13/15 10:58 AM  Result Value Ref Range Status   MRSA by PCR NEGATIVE NEGATIVE Final    Comment:        The GeneXpert MRSA Assay (FDA approved for NASAL specimens only), is one component of a comprehensive MRSA colonization surveillance program. It is not intended to diagnose MRSA infection nor to guide or monitor treatment for MRSA infections.      Studies:  Recent x-ray studies have been reviewed in detail by the Attending Physician  Scheduled Meds:  Scheduled Meds: . alteplase  2 mg Intracatheter Once  . antiseptic oral  rinse  7 mL Mouth Rinse BID  . aspirin  81 mg Oral Daily  . digoxin  0.125 mg Oral Daily  . furosemide  40 mg Oral Daily  . heparin subcutaneous  5,000 Units Subcutaneous 3 times per day  . hydrALAZINE  25 mg Oral TID  . insulin aspart  0-20 Units Subcutaneous TID AC & HS  . insulin aspart  22 Units Subcutaneous TID WC  . insulin glargine  45 Units Subcutaneous QHS  . isosorbide mononitrate  30 mg Oral Daily  . levalbuterol  1.25 mg Nebulization TID  . losartan  12.5 mg Oral Daily  . rosuvastatin  20 mg Oral q1800  . sodium chloride  10-40 mL Intracatheter Q12H  . spironolactone  25 mg Oral Daily    Time spent on care of this patient: 25 mins  Cherene Altes, MD [facility] For Consults/Admissions - Flow Manager - [phone] Office  [phone]  Contact MD directly via text page:  [email or web address]      password [facility]  04/20/2015, 11:35 AM   LOS: 10 days

## 2015-04-20 NOTE — Progress Notes (Signed)
Physical Therapy Treatment Patient Details Name: Jenna Brown MRN: [id number] DOB: 1950/12/04 Today's Date: May 07, 2015    History of Present Illness Patient is a 64 year old female with hyperlipidemia, CVA, hypertension, type 2 diabetes mellitus, CAD, right AKA presented from home with acute shortness of breath, CHF    PT Comments    Pt able to ambulate today with RW and prosthesis with decreased control of knee extension on left in stance as well as cues for LLE HEP. Pt states gait is declined from baseline and would benefit from additional therapy for strengthening and gait. Will follow.   Follow Up Recommendations  Home health PT     Equipment Recommendations       Recommendations for Other Services       Precautions / Restrictions Precautions Precautions: Fall Precaution Comments: R AKA with prosthesis    Mobility  Bed Mobility                  Transfers   Equipment used: Rolling walker (2 wheeled)       Squat pivot transfers: Supervision     General transfer comment: cues for hand placement and safety  Ambulation/Gait Ambulation/Gait assistance: Supervision Ambulation Distance (Feet): 70 Feet Assistive device: Rolling walker (2 wheeled) Gait Pattern/deviations: Step-to pattern;Decreased stride length   Gait velocity interpretation: Below normal speed for age/gender General Gait Details: cues for safety with left knee flexing in stance despite cues for bil UE assist for weight bearing and increased extension in standing   Stairs            Wheelchair Mobility    Modified Rankin (Stroke Patients Only)       Balance     Sitting balance-Leahy Scale: Good       Standing balance-Leahy Scale: Poor                      Cognition Arousal/Alertness: Awake/alert Behavior During Therapy: WFL for tasks assessed/performed Overall Cognitive Status: Within Functional Limits for tasks assessed                       Exercises General Exercises - Lower Extremity Long Arc Quad: AROM;Left;10 reps;Seated Straight Leg Raises: AROM;Seated;Left;10 reps Hip Flexion/Marching: AROM;Seated;Left;10 reps    General Comments        Pertinent Vitals/Pain Pain Assessment: No/denies pain  HR 108-128    Home Living                      Prior Function            PT Goals (current goals can now be found in the care plan section) Progress towards PT goals: Progressing toward goals    Frequency       PT Plan Discharge plan needs to be updated    Co-evaluation             End of Session Equipment Utilized During Treatment: Gait belt Activity Tolerance: Patient tolerated treatment well Patient left: in chair;with call bell/phone within reach     Time: 1252-1310 PT Time Calculation (min) (ACUTE ONLY): 18 min  Charges:  $Gait Training: 8-22 mins                    G Codes:      Melford Aase 05-07-2015, 1:16 PM .Lanetta Inch [place], [place]

## 2015-04-20 NOTE — Progress Notes (Signed)
Advanced Heart Failure Rounding Note   Subjective:    Milrinone stopped 7/29. CO-OX today down a little 57%.   Denies SOB/CP.    RHC/LHC 04/14/15  Prox LAD to Mid LAD lesion, 90% stenosed.  Ost Cx to Mid Cx lesion, 95% stenosed.  Ost 2nd Mrg to 2nd Mrg lesion, 80% stenosed.  Mid Cx lesion, 90% stenosed.  Prox RCA lesion, 60% stenosed.  Mid RCA lesion, 50% stenosed. CO-OX 32% CO 3.0 CI 1.68   Objective:   Weight Range:  Vital Signs:   Temp:  [97.4 F (36.3 C)-98.1 F (36.7 C)] 97.9 F (36.6 C) (08/01 0309) Pulse Rate:  [94-107] 94 (08/01 0309) Resp:  [15-18] 15 (07/31 2308) BP: (118-156)/(57-120) 133/86 mmHg (08/01 0309) SpO2:  [94 %-99 %] 97 % (08/01 0728) Weight:  [158 lb 15.2 oz (72.1 kg)] 158 lb 15.2 oz (72.1 kg) (08/01 0309) Last BM Date: 04/19/15  Weight change: Filed Weights   04/18/15 0500 04/19/15 0300 04/20/15 0309  Weight: 158 lb 8.2 oz (71.9 kg) 163 lb 12.8 oz (74.3 kg) 158 lb 15.2 oz (72.1 kg)    Intake/Output:   Intake/Output Summary (Last 24 hours) at 04/20/15 0745 Last data filed at 04/20/15 0500  Gross per 24 hour  Intake    720 ml  Output   2000 ml  Net  -1280 ml    CVP 7   Physical Exam: General: Well developed, well nourished, female in no acute distress. Sitting on the side of the bed. Head: Normocephalic, atraumatic. Neck: Supple without bruits, JVD 6-7 cm. Lungs: Resp regular and unlabored, rales w/ decreased BS bases. Heart: Distant RRR, S1, S2, + S3, S4, or murmur; no rub. Abdomen: Soft, non-tender, non-distended, BS + x 4.  Extremities: No clubbing, cyanosis, no L edema. S/p RLE amputation.RUE PICC  Neuro: Alert and oriented X 3. Moves all extremities spontaneously. Psych: Normal affect  Telemetry: Sinus Tach 100s  Labs: Basic Metabolic Panel:  Recent Labs Lab 04/16/15 0500 04/17/15 0255 04/18/15 0400 04/19/15 0647 04/20/15 0315  NA 138 136 136 137 140  K 4.2 4.6 4.9 5.1 4.5  CL 105 102 103 104 105  CO2 27  27 27 26 29   GLUCOSE 139* 175* 177* 241* 86  BUN 30* 30* 31* 30* 27*  CREATININE 1.36* 1.22* 1.22* 1.19* 1.15*  CALCIUM 8.7* 8.9 8.6* 9.0 9.3  MG 1.8 2.1 2.0 1.9 1.8    Liver Function Tests: No results for input(s): AST, ALT, ALKPHOS, BILITOT, PROT, ALBUMIN in the last 168 hours. No results for input(s): LIPASE, AMYLASE in the last 168 hours. No results for input(s): AMMONIA in the last 168 hours.  CBC: No results for input(s): WBC, NEUTROABS, HGB, HCT, MCV, PLT in the last 168 hours.  Cardiac Enzymes: No results for input(s): CKTOTAL, CKMB, CKMBINDEX, TROPONINI in the last 168 hours.  BNP: BNP (last 3 results)  Recent Labs  04/10/15 0850  BNP 395.3*    ProBNP (last 3 results) No results for input(s): PROBNP in the last 8760 hours.    Other results:  Imaging: Dg Chest 2 View  04/18/2015   CLINICAL DATA:  Shortness of breath.  Low O2 sats.  EXAM: CHEST  2 VIEW  COMPARISON:  04/12/2015  FINDINGS: Heart is normal size. There are low lung volumes with bibasilar airspace opacities, left greater than right, likely atelectasis. No significant effusions. No acute bony abnormality.  IMPRESSION: Bibasilar opacities, left greater than right, likely atelectasis.   Electronically Signed   By: Lennette Bihari  Dover M.D.   On: 04/18/2015 13:12     Medications:     Scheduled Medications: . antiseptic oral rinse  7 mL Mouth Rinse BID  . aspirin  81 mg Oral Daily  . digoxin  0.125 mg Oral Daily  . furosemide  40 mg Oral Daily  . heparin subcutaneous  5,000 Units Subcutaneous 3 times per day  . hydrALAZINE  12.5 mg Oral TID  . insulin aspart  0-20 Units Subcutaneous TID AC & HS  . insulin aspart  22 Units Subcutaneous TID WC  . insulin glargine  45 Units Subcutaneous QHS  . isosorbide mononitrate  15 mg Oral Daily  . levalbuterol  1.25 mg Nebulization TID  . losartan  12.5 mg Oral Daily  . rosuvastatin  20 mg Oral q1800  . sodium chloride  10-40 mL Intracatheter Q12H  . spironolactone   25 mg Oral Daily    Infusions: . sodium chloride Stopped (04/17/15 0911)    PRN Medications: sodium chloride, acetaminophen, ALPRAZolam, benzonatate, magnesium hydroxide, meclizine, ondansetron (ZOFRAN) IV, sodium chloride   Assessment:  1. Acute Systolic Heart Failure 2. Cardiogenic shock on RHC 7/26 3. ICM per cath 7/26  3. PAD  5. R AKA 6. DMII   7. TIA     Plan/Discussion:    64 year old with ICM/Cardiogenic shock noted on RHC 7/26.   Off milrinone 7/29. CO-OX down a little. No BB with low output. Volume status stable. Continue lasix 40 mg daily + 25 mg spiro . Increase hydralazine to 25 mg tid/imdur to 30 mg daily. Continue losartan 12.5 mg daily. Watch K closely.   Mag 1.8 - Give 2 grams of mag now.   PT to work with her today.     Length of Stay: 10  CLEGG,AMY NP-C  04/20/2015, 7:45 AM Advanced Heart Failure Team Pager [phone] (M-F; [place])  Please contact [facility] for night-coverage after hours (4p -7a ) and weekends on [email or web address]  Patient seen and examined with Darrick Grinder, NP. We discussed all aspects of the encounter. I agree with the assessment and plan as stated above.   Co-ox dropped some with stopping milrinone. Agree with titrating hydralazine/nitrates.Would follow co-ox CVP for at least another day or two prior to d/c. Low threshold to consider home inotrope.   Urie Loughner,MD 3:08 PM

## 2015-04-21 LAB — BASIC METABOLIC PANEL
ANION GAP: 9 (ref 5–15)
BUN: 32 mg/dL — ABNORMAL HIGH (ref 6–20)
CHLORIDE: 99 mmol/L — AB (ref 101–111)
CO2: 29 mmol/L (ref 22–32)
Calcium: 9 mg/dL (ref 8.9–10.3)
Creatinine, Ser: 1.27 mg/dL — ABNORMAL HIGH (ref 0.44–1.00)
GFR calc Af Amer: 52 mL/min — ABNORMAL LOW (ref >60)
GFR, EST NON AFRICAN AMERICAN: 45 mL/min — AB (ref >60)
Glucose, Bld: 165 mg/dL — ABNORMAL HIGH (ref 65–99)
Potassium: 4.6 mmol/L (ref 3.5–5.1)
Sodium: 137 mmol/L (ref 135–145)

## 2015-04-21 LAB — CARBOXYHEMOGLOBIN
Carboxyhemoglobin: 1.9 % — ABNORMAL HIGH (ref 0.5–1.5)
Methemoglobin: 1.3 % (ref 0.0–1.5)
O2 Saturation: 63.4 %
TOTAL HEMOGLOBIN: 8.9 g/dL — AB (ref 12.0–16.0)

## 2015-04-21 LAB — GLUCOSE, CAPILLARY
Glucose-Capillary: 156 mg/dL — ABNORMAL HIGH (ref 65–99)
Glucose-Capillary: 192 mg/dL — ABNORMAL HIGH (ref 65–99)
Glucose-Capillary: 64 mg/dL — ABNORMAL LOW (ref 65–99)
Glucose-Capillary: 294 mg/dL — ABNORMAL HIGH (ref 65–99)

## 2015-04-21 LAB — CBC
HCT: 28.6 % — ABNORMAL LOW (ref 36.0–46.0)
Hemoglobin: 8.7 g/dL — ABNORMAL LOW (ref 12.0–15.0)
MCH: 24.9 pg — ABNORMAL LOW (ref 26.0–34.0)
MCHC: 30.4 g/dL (ref 30.0–36.0)
MCV: 81.9 fL (ref 78.0–100.0)
Platelets: 363 10*3/uL (ref 150–400)
RBC: 3.49 MIL/uL — ABNORMAL LOW (ref 3.87–5.11)
RDW: 14.9 % (ref 11.5–15.5)
WBC: 9.1 10*3/uL (ref 4.0–10.5)

## 2015-04-21 LAB — MAGNESIUM: MAGNESIUM: 2.2 mg/dL (ref 1.7–2.4)

## 2015-04-21 MED ORDER — LOSARTAN POTASSIUM 25 MG PO TABS: 12.5000 mg | ORAL_TABLET | Freq: Every day | ORAL | Status: DC

## 2015-04-21 MED ORDER — INSULIN PEN NEEDLE 29G X 12MM MISC: 22.0000 [IU] | Freq: Three times a day (TID) | Status: DC

## 2015-04-21 MED ORDER — DIGOXIN 125 MCG PO TABS: 0.1250 mg | ORAL_TABLET | Freq: Every day | ORAL | Status: DC

## 2015-04-21 MED ORDER — LEVALBUTEROL HCL 1.25 MG/0.5ML IN NEBU
1.2500 mg | INHALATION_SOLUTION | Freq: Two times a day (BID) | RESPIRATORY_TRACT | Status: DC
  Administered 2015-04-21 – 2015-04-22 (×2): 1.25 mg via RESPIRATORY_TRACT
  Filled 2015-04-21 (×2): qty 0.5

## 2015-04-21 MED ORDER — SPIRONOLACTONE 25 MG PO TABS: 25.0000 mg | ORAL_TABLET | Freq: Every day | ORAL | Status: DC

## 2015-04-21 MED ORDER — FUROSEMIDE 40 MG PO TABS: 40.0000 mg | ORAL_TABLET | Freq: Every day | ORAL | Status: DC

## 2015-04-21 MED ORDER — HYDRALAZINE HCL 25 MG PO TABS: 25.0000 mg | ORAL_TABLET | Freq: Three times a day (TID) | ORAL | Status: DC

## 2015-04-21 MED ORDER — ROSUVASTATIN CALCIUM 20 MG PO TABS: 20.0000 mg | ORAL_TABLET | Freq: Every day | ORAL | Status: DC

## 2015-04-21 MED ORDER — ISOSORBIDE MONONITRATE ER 30 MG PO TB24: 30.0000 mg | ORAL_TABLET | Freq: Every day | ORAL | Status: DC

## 2015-04-21 MED ORDER — INSULIN ASPART 100 UNIT/ML FLEXPEN: 22.0000 [IU] | PEN_INJECTOR | Freq: Three times a day (TID) | SUBCUTANEOUS | Status: DC

## 2015-04-21 MED ORDER — INSULIN GLARGINE 100 UNIT/ML SOLOSTAR PEN: 45.0000 [IU] | PEN_INJECTOR | Freq: Every day | SUBCUTANEOUS | Status: DC

## 2015-04-21 MED ORDER — LEVALBUTEROL HCL 0.63 MG/3ML IN NEBU: 0.6300 mg | INHALATION_SOLUTION | Freq: Four times a day (QID) | RESPIRATORY_TRACT | Status: DC | PRN

## 2015-04-21 MED ORDER — LEVALBUTEROL HCL 1.25 MG/0.5ML IN NEBU: 1.2500 mg | INHALATION_SOLUTION | Freq: Three times a day (TID) | RESPIRATORY_TRACT | Status: DC | PRN

## 2015-04-21 NOTE — Discharge Summary (Signed)
Physician Discharge Summary  Jenna Brown WYO:[id number] DOB: 1951-08-06 DOA: 04/10/2015  PCP: [name], MD  Admit date: 04/10/2015 Discharge date: 04/21/2015  Time spent: 40 minutes  Recommendations for Outpatient Follow-up: Acute respiratory failure with hypoxia:  -multifactorial to include fluid overload, NSTEMI, and CAP -Strict in and out at discharge patient has diuresed a total of - 9.3L -Daily a.m. Admission Weight= 72.5 kg 8/2 weight bed at discharge = 72kg  -completed seven-day regimen antibiotics -Follow-up with PCP within 7 days.  NSTEMI/ Acute Exacerbation chronic Systolic CHF - Cardiogenic shock -Echocardiogram; LVEF= 20% see results below  -S/P Cardiac catheterization showing three-vessel disease; per cardiothoracic surgery not candidate for CABG or catheterization; medical management -Digoxin 0.125 mg daily -Furosemide 40 mg daily -Hydralazine 12.5 mg TID -Imdur 30 mg daily -Losartan 12.5 mg daily -Spironolactone 25 mg daily -Follow-up with Dr.Daniel R Bensimhon, at [place] in the heart failure clinic on 15 August  Hypertension -See acute systolic CHF   Peripheral vascular disease  - continue aspirin 81 mg daily  Hyperlipidemia -Crestor 20 mg daily   DM Type II (diabetes mellitus, type 2) uncontrolled - 7/22 hemoglobin A1c= 11.3 -Increase Lantus to 45 units daily -Increase NovoLog 22 units QAC  Hyperkalemia -Patient on 2 medications which can cause hyperkalemia, monitor closely -Patient given dose of Lasix by cardiology -PCP and cardiologist to monitor   Acute renal failure ( Unknown baseline)  -Cr stable -Hold all nephrotoxic medication -Hold metformin, HCTZ, ibuprofen    Discharge Diagnoses:  Principal Problem:   Acute respiratory failure with hypoxia Active Problems:   Hypertension   Hyperlipidemia   Peripheral vascular disease   DMII (diabetes mellitus, type 2)   Tachycardia   CHF (congestive heart failure)    Acute respiratory failure   Acute kidney injury   Acute respiratory failure with hypoxemia   Acute pulmonary edema   Acute exacerbation of CHF (congestive heart failure)   Hypoxia   Atelectasis   Elevated d-dimer   NSTEMI (non-ST elevated myocardial infarction)   Acute systolic CHF (congestive heart failure)   Essential hypertension   HLD (hyperlipidemia)   Metabolic acidosis   Diabetes type 2, uncontrolled   Acute renal failure syndrome   Cardiac enzymes elevated   Acute systolic heart failure   Cardiogenic shock   Coronary artery disease due to lipid rich plaque   Hyperkalemia   Discharge Condition: Stable  Diet recommendation: Heart healthy/diabetic  Filed Weights   04/19/15 0300 04/20/15 0309 04/21/15 0310  Weight: 74.3 kg (163 lb 12.8 oz) 72.1 kg (158 lb 15.2 oz) 72 kg (158 lb 11.7 oz)    History of present illness:  63-year-[name], HLD, HTN, CAD Diabetes Type 2 uncontrolled, CVA, Rt AKA   Presented from home with acute shortness of breath. History was obtained from the patient who reported that she has been having worsening shortness of breath for last 3 days. She has noticed worsening orthopnea, PND, mostly sitting up with 4 pillows, has noticed weight gain of 6 lbs (unable to tell me the duration), swelling in her left leg. However this morning, patient had difficult time catching her breath and EMS was called. Per EMS, O2 sats were 85% on room air and she was placed on NRB mask. She denied any fevers, chills, productive cough or any wheezing. She did have discomfort in her left upper abdomen at the time of triage however denies any abdominal pain at this time. ER workup/EDP recommendations reviewed, patient was placed on BiPAP in  ED. She was evaluated by cardiology in ED, Dr Acie Fredrickson recommended pulmonary critical care evaluation.  ER workup showed sodium 137, BUN 26, creatinine 1.49, glucose 493, BNP 395.3, troponin 0.15 WBCs 11.5, hemoglobin 10.9 Elevated  d-dimer of 0.85 During his hospitalization patient was found to have three-vessel disease which met criteria for CABG however patient was deemed to be a very poor candidate for CABG. Congestive heart failure team also felt that a second cardiac catheterization would not benefit patient and medical management would be the choice of care. Patient was diuresed aggressively and her respiratory/cardiac status improved significantly. In addition patient's sisters have agreed to have patient live with them in order to ensure that she is compliant with her medication.    Consultants: Dr. Wonda Cheng Nahser (cardiology) Dr.Wesam Kathryne Sharper [facility] M)   Procedure/Significant Events: 7/23 echocardiogram;- LVEF=20%. The entire mid-ventricle and apex are akinetic.(could be a Takotsubo ventricle). 7/25 Rt/Lt heart cardiac catheterization-;Prox LAD to Mid LAD lesion, 90% stenosed. -Ost Cx to Mid Cx lesion, 95% stenosed.-Ost 2nd Mrg to 2nd Mrg lesion, 80% stenosed. -Mid Cx lesion, 90% stenosed.-Prox RCA lesion, 60% stenosed.-Mid RCA lesion, 50% stenosed. 7/30 CXR; when compared to CXR 7/22 significant improvement in pulmonary edema   Culture 7/22 urine NGTD 7/22 blood left forearm/antecubital NGTD   Antibiotics: Azithromycin 7/22>> stopped 7/28 Ceftriaxone 7/22>> stopped 7/28    Discharge Exam: Filed Vitals:   04/21/15 0310 04/21/15 0735 04/21/15 0800 04/21/15 1133  BP: 133/63  106/64 115/58  Pulse: 95  93   Temp: 98.2 F (36.8 C)   98 F (36.7 C)  TempSrc: Oral   Oral  Resp:    18  Height:      Weight: 72 kg (158 lb 11.7 oz)     SpO2: 96% 98% 100% 100%    General: A/O 4, sitting in bed comfortably, No acute respiratory distress Eyes: Negative headache, eye pain, double vision, negative scleral hemorrhage ENT: Negative Runny nose, negative ear pain, negative tinnitus, negative gingival bleeding Neck: Negative scars, masses, torticollis, lymphadenopathy, JVD Lungs: Clear to auscultation;  decreased breath sounds LLL, mild crackles RLL, negative wheezing  Cardiovascular: Tachycardic, Regular rhythm without murmur gallop or rub normal S1 and S2 Abdomen:negative abdominal pain, negative dysphagia, Nontender, nondistended, soft, bowel sounds positive, no rebound, no ascites, no appreciable mass    Discharge Instructions     Medication List    ASK your doctor about these medications        ALPRAZolam 0.25 MG tablet  Commonly known as:  XANAX  Take 0.25 mg by mouth 3 (three) times daily as needed for anxiety.     amLODipine 10 MG tablet  Commonly known as:  NORVASC  Take 10 mg by mouth daily.     aspirin 81 MG chewable tablet  Chew 81 mg by mouth daily.     atorvastatin 20 MG tablet  Commonly known as:  LIPITOR  Take 1 tablet (20 mg total) by mouth daily at 6 PM.     glipiZIDE-metformin 5-500 MG per tablet  Commonly known as:  METAGLIP  Take 2 tablets by mouth 2 (two) times daily.     hydrochlorothiazide 25 MG tablet  Commonly known as:  HYDRODIURIL  Take 25 mg by mouth daily.     ibuprofen 200 MG tablet  Commonly known as:  ADVIL,MOTRIN  Take 400 mg by mouth every 6 (six) hours as needed for mild pain or moderate pain.     LIVALO 4 MG Tabs  Generic drug:  Pitavastatin Calcium  Take 4 mg by mouth daily.     losartan 100 MG tablet  Commonly known as:  COZAAR  Take 100 mg by mouth daily.     meclizine 25 MG tablet  Commonly known as:  ANTIVERT  Take 25 mg by mouth daily as needed for dizziness.     vitamin C 500 MG tablet  Commonly known as:  ASCORBIC ACID  Take 500 mg by mouth daily.     zinc sulfate 220 MG capsule  Take 220 mg by mouth daily.       Allergies  Allergen Reactions  . Plavix [Clopidogrel Bisulfate] Itching  . Codeine Other (See Comments)    "makes me feel strange"  . Tylenol With Codeine #3 [Acetaminophen-Codeine] Other (See Comments)    "doesn't make me feel right"  . Tylenol [Acetaminophen] Other (See Comments)     "Doesn't feel right"        Follow-up Information    Follow up with Glori Bickers, MD. Go on 05/04/2015.   Specialty:  Cardiology   Why:  at 0940 am in [facility]--gate code 0008--please bring all medications to appt   Contact information:   [place] [address] [phone]       Follow up with [facility].   Why:  agency will call within 24-48hrs to set up appt   Contact information:   [address] [place] [address] [phone]        The results of significant diagnostics from this hospitalization (including imaging, microbiology, ancillary and laboratory) are listed below for reference.    Significant Diagnostic Studies: Dg Chest 2 View  04/18/2015   CLINICAL DATA:  Shortness of breath.  Low O2 sats.  EXAM: CHEST  2 VIEW  COMPARISON:  04/12/2015  FINDINGS: Heart is normal size. There are low lung volumes with bibasilar airspace opacities, left greater than right, likely atelectasis. No significant effusions. No acute bony abnormality.  IMPRESSION: Bibasilar opacities, left greater than right, likely atelectasis.   Electronically Signed   By: Rolm Baptise M.D.   On: 04/18/2015 13:12   Dg Chest Port 1 View  04/12/2015   CLINICAL DATA:  Current history of systolic CHF, hypertension and diabetes. Followup pulmonary edema and effusions.  EXAM: PORTABLE CHEST - 1 VIEW  COMPARISON:  04/11/2015 and earlier.  FINDINGS: Suboptimal inspiration. Cardiac silhouette mildly enlarged. From pulmonary venous hypertension and mild interstitial pulmonary edema, unchanged. Large bilateral pleural effusions and associated dense consolidation in the lower lobes, unchanged. No new pulmonary parenchymal abnormalities.  IMPRESSION: 1. Stable mild CHF and/or fluid overload with mild interstitial pulmonary edema. 2. Stable moderately large bilateral pleural effusions and associated dense passive atelectasis in the lower  lobes. 3. No new abnormalities.   Electronically Signed   By: Evangeline Dakin M.D.   On: 04/12/2015 09:26   Dg Chest Port 1 View  04/11/2015   CLINICAL DATA:  CHF  EXAM: PORTABLE CHEST - 1 VIEW  COMPARISON:  04/10/2015  FINDINGS: Cardiac shadow is stable and mildly enlarged. Bilateral pleural effusions and bibasilar airspace opacities are seen stable from the prior exam. The overall inspiratory effort has improved however. No bony abnormality is noted.  IMPRESSION: Stable bibasilar changes.   Electronically Signed   By: Inez Catalina M.D.   On: 04/11/2015 13:38   Dg Chest Port 1 View  04/10/2015   CLINICAL DATA:  Shortness of breath for 3 days.  Cough for 3 days. Former smoker.  EXAM: PORTABLE CHEST - 1 VIEW  COMPARISON:  05/10/2012  FINDINGS: The cardiac silhouette is obscured. Extremely diminished lung volumes with increased density at both lung bases and crowding/ accentuation of the bronchovascular markings in the perihilar and apical regions. No pneumothorax. No acute osseous abnormality is identified.  IMPRESSION: Low lung volumes with increased density in the lung bases, which may represent a combination of atelectasis or consolidation and pleural effusions. Dedicated two-view chest radiographs recommended when clinically able.   Electronically Signed   By: Logan Bores   On: 04/10/2015 09:16    Microbiology: Recent Results (from the past 240 hour(s))  MRSA PCR Screening     Status: None   Collection Time: 04/13/15 10:58 AM  Result Value Ref Range Status   MRSA by PCR NEGATIVE NEGATIVE Final    Comment:        The GeneXpert MRSA Assay (FDA approved for NASAL specimens only), is one component of a comprehensive MRSA colonization surveillance program. It is not intended to diagnose MRSA infection nor to guide or monitor treatment for MRSA infections.      Labs: Basic Metabolic Panel:  Recent Labs Lab 04/17/15 0255 04/18/15 0400 04/19/15 0647 04/20/15 0315 04/21/15 0320  NA  136 136 137 140 137  K 4.6 4.9 5.1 4.5 4.6  CL 102 103 104 105 99*  CO2 '27 27 26 29 29  ' GLUCOSE 175* 177* 241* 86 165*  BUN 30* 31* 30* 27* 32*  CREATININE 1.22* 1.22* 1.19* 1.15* 1.27*  CALCIUM 8.9 8.6* 9.0 9.3 9.0  MG 2.1 2.0 1.9 1.8 2.2   Liver Function Tests: No results for input(s): AST, ALT, ALKPHOS, BILITOT, PROT, ALBUMIN in the last 168 hours. No results for input(s): LIPASE, AMYLASE in the last 168 hours. No results for input(s): AMMONIA in the last 168 hours. CBC:  Recent Labs Lab 04/21/15 0320  WBC 9.1  HGB 8.7*  HCT 28.6*  MCV 81.9  PLT 363   Cardiac Enzymes: No results for input(s): CKTOTAL, CKMB, CKMBINDEX, TROPONINI in the last 168 hours. BNP: BNP (last 3 results)  Recent Labs  04/10/15 0850  BNP 395.3*    ProBNP (last 3 results) No results for input(s): PROBNP in the last 8760 hours.  CBG:  Recent Labs Lab 04/20/15 1636 04/20/15 1652 04/20/15 2128 04/21/15 0758 04/21/15 1127  GLUCAP 68 77 266* 156* 192*       Signed:  Dia Crawford, MD [facility] [phone] pager

## 2015-04-21 NOTE — Progress Notes (Signed)
Pt refuses NIV. RN aware

## 2015-04-21 NOTE — Progress Notes (Signed)
Plan for patient to be discharged today to sisters [name]) home today. Upon speaking with Ivin Booty, she informed me that she would not be able to provide the care needed for her sister, August, at this time. She asked if there was something else that could be done like rehab or if we could keep her in the hospital longer to provide therapy until she was strong enough to be left alone for long periods of time. Dr Sherral Hammers informed of the patients sisters questions and concerns. Discharge canceled for today. New orders for Social work consult, OT eval, PT eval, and SNF vs CIR placement. Case manager, Elissa Hefty informed. [name] also informed of new plan.  Loni Muse, RN

## 2015-04-21 NOTE — Progress Notes (Addendum)
Advanced Heart Failure Rounding Note   Subjective:    Milrinone stopped 7/29. CO-OX today 62%. Yesterday hydralazine was increased to 25 mg tid.    Denies SOB/CP.  Wants to go home.   RHC/LHC 04/14/15  Prox LAD to Mid LAD lesion, 90% stenosed.  Ost Cx to Mid Cx lesion, 95% stenosed.  Ost 2nd Mrg to 2nd Mrg lesion, 80% stenosed.  Mid Cx lesion, 90% stenosed.  Prox RCA lesion, 60% stenosed.  Mid RCA lesion, 50% stenosed. CO-OX 32% CO 3.0 CI 1.68   Objective:   Weight Range:  Vital Signs:   Temp:  [97.9 F (36.6 C)-99.5 F (37.5 C)] 98.2 F (36.8 C) (08/02 0310) Pulse Rate:  [95-115] 95 (08/02 0310) Resp:  [14-16] 16 (08/01 1634) BP: (97-136)/(60-86) 133/63 mmHg (08/02 0310) SpO2:  [95 %-100 %] 98 % (08/02 0735) Weight:  [158 lb 11.7 oz (72 kg)] 158 lb 11.7 oz (72 kg) (08/02 0310) Last BM Date: 04/19/15  Weight change: Filed Weights   04/19/15 0300 04/20/15 0309 04/21/15 0310  Weight: 163 lb 12.8 oz (74.3 kg) 158 lb 15.2 oz (72.1 kg) 158 lb 11.7 oz (72 kg)    Intake/Output:   Intake/Output Summary (Last 24 hours) at 04/21/15 [id number] Last data filed at 04/21/15 0500  Gross per 24 hour  Intake    800 ml  Output   2050 ml  Net  -1250 ml      Physical Exam: General: Well developed, well nourished, female in no acute distress. In bed.  Head: Normocephalic, atraumatic. Neck: Supple without bruits, JVD 6-7 cm. Lungs: Resp regular and unlabored, rales w/ decreased BS bases. Heart: Distant RRR, S1, S2, + S3, S4, or murmur; no rub. Abdomen: Soft, non-tender, non-distended, BS + x 4.  Extremities: No clubbing, cyanosis, no L edema. S/p RLE amputation.RUE PICC  Neuro: Alert and oriented X 3. Moves all extremities spontaneously. Psych: Normal affect  Telemetry: Sinus Tach 100s  Labs: Basic Metabolic Panel:  Recent Labs Lab 04/17/15 0255 04/18/15 0400 04/19/15 0647 04/20/15 0315 04/21/15 0320  NA 136 136 137 140 137  K 4.6 4.9 5.1 4.5 4.6  CL 102 103  104 105 99*  CO2 27 27 26 29 29   GLUCOSE 175* 177* 241* 86 165*  BUN 30* 31* 30* 27* 32*  CREATININE 1.22* 1.22* 1.19* 1.15* 1.27*  CALCIUM 8.9 8.6* 9.0 9.3 9.0  MG 2.1 2.0 1.9 1.8 2.2    Liver Function Tests: No results for input(s): AST, ALT, ALKPHOS, BILITOT, PROT, ALBUMIN in the last 168 hours. No results for input(s): LIPASE, AMYLASE in the last 168 hours. No results for input(s): AMMONIA in the last 168 hours.  CBC:  Recent Labs Lab 04/21/15 0320  WBC 9.1  HGB 8.7*  HCT 28.6*  MCV 81.9  PLT 363    Cardiac Enzymes: No results for input(s): CKTOTAL, CKMB, CKMBINDEX, TROPONINI in the last 168 hours.  BNP: BNP (last 3 results)  Recent Labs  04/10/15 0850  BNP 395.3*    ProBNP (last 3 results) No results for input(s): PROBNP in the last 8760 hours.    Other results:  Imaging: No results found.   Medications:     Scheduled Medications: . antiseptic oral rinse  7 mL Mouth Rinse BID  . aspirin  81 mg Oral Daily  . digoxin  0.125 mg Oral Daily  . furosemide  40 mg Oral Daily  . heparin subcutaneous  5,000 Units Subcutaneous 3 times per day  . hydrALAZINE  25 mg Oral TID  . insulin aspart  0-20 Units Subcutaneous TID AC & HS  . insulin aspart  22 Units Subcutaneous TID WC  . insulin glargine  45 Units Subcutaneous QHS  . isosorbide mononitrate  30 mg Oral Daily  . levalbuterol  1.25 mg Nebulization TID  . losartan  12.5 mg Oral Daily  . rosuvastatin  20 mg Oral q1800  . spironolactone  25 mg Oral Daily    Infusions:    PRN Medications: acetaminophen, ALPRAZolam, benzonatate, magnesium hydroxide, meclizine, ondansetron (ZOFRAN) IV, sodium chloride   Assessment:  1. Acute Systolic Heart Failure 2. Cardiogenic shock on RHC 7/26 3. ICM per cath 7/26  3. PAD  5. R AKA 6. DMII   7. TIA     Plan/Discussion:    64 year old with ICM/Cardiogenic shock noted on RHC 7/26.   Off milrinone 7/29. CO-OX stable today at 63%. No BB with low output.  Volume status stable. Continue lasix 40 mg daily + 25 mg spiro . Continue hydralazine to 25 mg tid/imdur to 30 mg daily. Increase losartan to 25 mg daily.  Watch K closely.   Will need HH for medication management and PICC f/u.   [facility] for discharge. Will make F/U in HF clinic next week.  Home Meds Lasix 40 mg dialy Spironolactone 25 mg daily Hydralazine 25 mg tid.  Imdur 30 mg daily Losartan 25 mg daily Digoxin 0.125 mg daily      Length of Stay: 11  CLEGG,AMY NP-C  04/21/2015, 8:12 AM Advanced Heart Failure Team Pager [phone] (M-F; 7a - 4p)  Please contact [facility] for night-coverage after hours (4p -7a ) and weekends on [email or web address]  Patient seen and examined with Darrick Grinder, NP. We discussed all aspects of the encounter. I agree with the assessment and plan as stated above.   She is much improved. Agree with d/c today with close f/u in HF Clinic. Agree with above meds. Would take extra lasix 40 mg in afternoon for weight 160 or greater. Continue to treat CAD medically. Would leave PICC in until next week's visit in HF Clinic so we can recheck co-ox,   Quinn Quam,MD 9:41 AM

## 2015-04-21 NOTE — Progress Notes (Signed)
Thank you for consult on Jenna Brown. Chart reviewed and therapy notes reviewed. She does not have any OT needs and is at supervision level with recommendations for follow up HHPT. Will defer CIR consult for now. Recommend follow up with prosthetist on outpatient basis for evaluation/adjustment of her prosthesis.

## 2015-04-21 NOTE — Progress Notes (Addendum)
Began ed with HF, low sodium however pt stopped me and asked if I could wait for her sister to come. RN to page me when she is here. Will return. Gave pt HF video to watch and HF booklet to read. Pt appreciative. [id number] Yves Dill CES, ACSM 11:41 AM 04/21/2015   Pts sister has not been able to come to the hospital yet. Discussed daily wts, low sodium with pt. She voices understanding but her retention is poor. She has materials to give to sister and RN to review with sister. Pt is not appropriate for CRPII at this time. [place], ACSM 2:19 PM 04/21/2015

## 2015-04-22 DIAGNOSIS — I2510 Atherosclerotic heart disease of native coronary artery without angina pectoris: Secondary | ICD-10-CM | POA: Diagnosis not present

## 2015-04-22 DIAGNOSIS — E785 Hyperlipidemia, unspecified: Secondary | ICD-10-CM | POA: Diagnosis not present

## 2015-04-22 DIAGNOSIS — Z833 Family history of diabetes mellitus: Secondary | ICD-10-CM | POA: Diagnosis not present

## 2015-04-22 DIAGNOSIS — R2689 Other abnormalities of gait and mobility: Secondary | ICD-10-CM | POA: Diagnosis not present

## 2015-04-22 DIAGNOSIS — E0851 Diabetes mellitus due to underlying condition with diabetic peripheral angiopathy without gangrene: Secondary | ICD-10-CM | POA: Diagnosis not present

## 2015-04-22 DIAGNOSIS — R570 Cardiogenic shock: Secondary | ICD-10-CM | POA: Diagnosis not present

## 2015-04-22 DIAGNOSIS — Z8673 Personal history of transient ischemic attack (TIA), and cerebral infarction without residual deficits: Secondary | ICD-10-CM | POA: Diagnosis not present

## 2015-04-22 DIAGNOSIS — E1121 Type 2 diabetes mellitus with diabetic nephropathy: Secondary | ICD-10-CM | POA: Diagnosis not present

## 2015-04-22 DIAGNOSIS — I509 Heart failure, unspecified: Secondary | ICD-10-CM | POA: Diagnosis not present

## 2015-04-22 DIAGNOSIS — Z79899 Other long term (current) drug therapy: Secondary | ICD-10-CM | POA: Diagnosis not present

## 2015-04-22 DIAGNOSIS — I259 Chronic ischemic heart disease, unspecified: Secondary | ICD-10-CM | POA: Diagnosis not present

## 2015-04-22 DIAGNOSIS — J9811 Atelectasis: Secondary | ICD-10-CM | POA: Diagnosis not present

## 2015-04-22 DIAGNOSIS — R41841 Cognitive communication deficit: Secondary | ICD-10-CM | POA: Diagnosis not present

## 2015-04-22 DIAGNOSIS — E1165 Type 2 diabetes mellitus with hyperglycemia: Secondary | ICD-10-CM | POA: Diagnosis not present

## 2015-04-22 DIAGNOSIS — Z794 Long term (current) use of insulin: Secondary | ICD-10-CM | POA: Diagnosis not present

## 2015-04-22 DIAGNOSIS — I214 Non-ST elevation (NSTEMI) myocardial infarction: Secondary | ICD-10-CM | POA: Diagnosis not present

## 2015-04-22 DIAGNOSIS — M6281 Muscle weakness (generalized): Secondary | ICD-10-CM | POA: Diagnosis not present

## 2015-04-22 DIAGNOSIS — Z7982 Long term (current) use of aspirin: Secondary | ICD-10-CM | POA: Diagnosis not present

## 2015-04-22 DIAGNOSIS — F419 Anxiety disorder, unspecified: Secondary | ICD-10-CM | POA: Diagnosis not present

## 2015-04-22 DIAGNOSIS — I5040 Unspecified combined systolic (congestive) and diastolic (congestive) heart failure: Secondary | ICD-10-CM | POA: Diagnosis not present

## 2015-04-22 DIAGNOSIS — R2681 Unsteadiness on feet: Secondary | ICD-10-CM | POA: Diagnosis not present

## 2015-04-22 DIAGNOSIS — D6862 Lupus anticoagulant syndrome: Secondary | ICD-10-CM | POA: Diagnosis not present

## 2015-04-22 DIAGNOSIS — R1311 Dysphagia, oral phase: Secondary | ICD-10-CM | POA: Diagnosis not present

## 2015-04-22 DIAGNOSIS — J9601 Acute respiratory failure with hypoxia: Secondary | ICD-10-CM | POA: Diagnosis not present

## 2015-04-22 DIAGNOSIS — I69398 Other sequelae of cerebral infarction: Secondary | ICD-10-CM | POA: Diagnosis not present

## 2015-04-22 DIAGNOSIS — Z89611 Acquired absence of right leg above knee: Secondary | ICD-10-CM | POA: Diagnosis not present

## 2015-04-22 DIAGNOSIS — Z87891 Personal history of nicotine dependence: Secondary | ICD-10-CM | POA: Diagnosis not present

## 2015-04-22 DIAGNOSIS — K219 Gastro-esophageal reflux disease without esophagitis: Secondary | ICD-10-CM | POA: Diagnosis not present

## 2015-04-22 DIAGNOSIS — I739 Peripheral vascular disease, unspecified: Secondary | ICD-10-CM | POA: Diagnosis not present

## 2015-04-22 DIAGNOSIS — I255 Ischemic cardiomyopathy: Secondary | ICD-10-CM | POA: Diagnosis not present

## 2015-04-22 DIAGNOSIS — L84 Corns and callosities: Secondary | ICD-10-CM | POA: Diagnosis not present

## 2015-04-22 DIAGNOSIS — R000 Tachycardia, unspecified: Secondary | ICD-10-CM | POA: Diagnosis not present

## 2015-04-22 DIAGNOSIS — E119 Type 2 diabetes mellitus without complications: Secondary | ICD-10-CM | POA: Diagnosis not present

## 2015-04-22 DIAGNOSIS — G458 Other transient cerebral ischemic attacks and related syndromes: Secondary | ICD-10-CM | POA: Diagnosis not present

## 2015-04-22 DIAGNOSIS — N182 Chronic kidney disease, stage 2 (mild): Secondary | ICD-10-CM | POA: Diagnosis not present

## 2015-04-22 DIAGNOSIS — I2583 Coronary atherosclerosis due to lipid rich plaque: Secondary | ICD-10-CM | POA: Diagnosis not present

## 2015-04-22 DIAGNOSIS — E1051 Type 1 diabetes mellitus with diabetic peripheral angiopathy without gangrene: Secondary | ICD-10-CM | POA: Diagnosis not present

## 2015-04-22 DIAGNOSIS — J9600 Acute respiratory failure, unspecified whether with hypoxia or hypercapnia: Secondary | ICD-10-CM | POA: Diagnosis not present

## 2015-04-22 DIAGNOSIS — G459 Transient cerebral ischemic attack, unspecified: Secondary | ICD-10-CM | POA: Diagnosis not present

## 2015-04-22 DIAGNOSIS — I10 Essential (primary) hypertension: Secondary | ICD-10-CM | POA: Diagnosis not present

## 2015-04-22 DIAGNOSIS — I5021 Acute systolic (congestive) heart failure: Secondary | ICD-10-CM | POA: Diagnosis not present

## 2015-04-22 DIAGNOSIS — I5022 Chronic systolic (congestive) heart failure: Secondary | ICD-10-CM | POA: Diagnosis not present

## 2015-04-22 DIAGNOSIS — I252 Old myocardial infarction: Secondary | ICD-10-CM | POA: Diagnosis not present

## 2015-04-22 LAB — GLUCOSE, CAPILLARY
GLUCOSE-CAPILLARY: 43 mg/dL — AB (ref 65–99)
Glucose-Capillary: 105 mg/dL — ABNORMAL HIGH (ref 65–99)
Glucose-Capillary: 210 mg/dL — ABNORMAL HIGH (ref 65–99)
Glucose-Capillary: 69 mg/dL (ref 65–99)

## 2015-04-22 LAB — BASIC METABOLIC PANEL
Anion gap: 8 (ref 5–15)
BUN: 28 mg/dL — ABNORMAL HIGH (ref 6–20)
CO2: 28 mmol/L (ref 22–32)
CREATININE: 1.24 mg/dL — AB (ref 0.44–1.00)
Calcium: 9.1 mg/dL (ref 8.9–10.3)
Chloride: 105 mmol/L (ref 101–111)
GFR calc Af Amer: 53 mL/min — ABNORMAL LOW (ref >60)
GFR calc non Af Amer: 46 mL/min — ABNORMAL LOW (ref >60)
Glucose, Bld: 72 mg/dL (ref 65–99)
POTASSIUM: 4.5 mmol/L (ref 3.5–5.1)
Sodium: 141 mmol/L (ref 135–145)

## 2015-04-22 LAB — MAGNESIUM: Magnesium: 2 mg/dL (ref 1.7–2.4)

## 2015-04-22 NOTE — Progress Notes (Signed)
CARDIAC REHAB PHASE I   PRE:  Rate/Rhythm: 113 ST  BP:  Sitting: 99/58        SaO2: 97 RA  MODE:  Ambulation: 110 ft   POST:  Rate/Rhythm: 120 ST  BP:  Sitting: 118/60         SaO2: 98 RA  Pt up in chair, prosthesis on, states she was sad earlier but is feeling better now and is glad to walk. Pt ambulated 110 ft on RA, rolling walker, gait belt, assist x2 (followed with recliner), mildly unsteady gait at baseline, tolerated well. Pt c/o some DOE, fatigue, denies CP, dizziness, seated rest x3. Pt does not give notice of need to sit, needs cueing and reminders. Pt did increase distance today, but was too fatigued to walk back to room and instead was returned to room seated in recliner. Pt shortness of breath improved with rest. Pt in recliner after walk, call bell within reach. Pt very appreciative of walk, states she feels better now. Will follow-up tomorrow.  [id number]  Lenna Sciara, RN, BSN 04/22/2015 2:50 PM

## 2015-04-22 NOTE — Clinical Social Work Placement (Signed)
   CLINICAL SOCIAL WORK PLACEMENT  NOTE  Date:  04/22/2015  Patient Details  Name: Jenna Brown MRN: [id number] Date of Birth: [birth year]  Clinical Social Work is seeking post-discharge placement for this patient at the [place] level of care (*CSW will initial, date and re-position this form in  chart as items are completed):  No   Patient/family provided with [facility] Work Department's list of facilities offering this level of care within the geographic area requested by the patient (or if unable, by the patient's family).  No   Patient/family informed of their freedom to choose among providers that offer the needed level of care, that participate in Medicare, Medicaid or managed care program needed by the patient, have an available bed and are willing to accept the patient.  No   Patient/family informed of [facility]'s ownership interest in [facility] and [facility], as well as of the fact that they are under no obligation to receive care at these facilities.  PASRR submitted to EDS on       PASRR number received on       Existing PASRR number confirmed on 04/22/15     FL2 transmitted to all facilities in geographic area requested by pt/family on 04/22/15     FL2 transmitted to all facilities within larger geographic area on       Patient informed that his/her managed care company has contracts with or will negotiate with certain facilities, including the following:        Yes   Patient/family informed of bed offers received.  Patient chooses bed at [facility]     Physician recommends and patient chooses bed at      Patient to be transferred to [facility] on 04/22/15.  Patient to be transferred to facility by  Ambulance Corey Harold)     Patient family notified on 04/22/15 of transfer.  Name of family member notified:  Sister:  [name]     PHYSICIAN Please sign FL2, Please prepare prescriptions, Please prepare priority  discharge summary, including medications     Additional Comment: Ok per MD for d/c to SNF today via EMS.  Nursing notified to call report.  CSW was able to facilitate a SNF bed offer with admissions office- [facility].  Patient stated she is sad but agreeable to SNF placement but still hopes to get back home soon. Sister verbalizes same sentiments. CSW signing off.  Lorie Phenix. Murrell Redden [id number]      _______________________________________________ Williemae Area, LCSW 04/22/2015, 9:45 PM

## 2015-04-22 NOTE — Progress Notes (Signed)
  [facility] TEAM 1 - Stepdown/ICU TEAM  Pt remains stable for d/c.  She has no new complaints.  Vitals are stable.    Blood pressure 148/103, pulse 114, temperature 97.6 F (36.4 C), temperature source Oral, resp. rate 15, height 5\' 1"  (1.549 m), weight 69.8 kg (153 lb 14.1 oz), SpO2 98 %.   General: No acute respiratory distress Lungs: Clear to auscultation bilaterally without wheezes or crackles Cardiovascular: Regular rate and rhythm Abdomen: Nontender, nondistended, soft, bowel sounds positive, no rebound, no ascites, no appreciable mass Extremities: No significant cyanosis, clubbing, or edema   No new labs to review.    Cherene Altes, MD [facility] For Consults/Admissions - Flow Manager [phone] Office  [phone]  Contact MD directly via text page:      [email or web address]      password [facility]

## 2015-04-22 NOTE — Progress Notes (Signed)
   Patient awaiting d/c to SNF. Remains stable. No new recs. Outpatient f/u in HF Clinic arranged.   Teddy Pena,MD 8:58 AM

## 2015-04-22 NOTE — Progress Notes (Signed)
Patient taken to [facility] home via EMS; report call to nurse at [facility]. PICC line intact and belongings sent with patient. FL2, discharge summary also sent with patient.   Loni Muse, RN

## 2015-04-22 NOTE — Progress Notes (Signed)
Physical Therapy Treatment Patient Details Name: Jenna Brown MRN: [id number] DOB: 05/29/1951 Today's Date: 04/22/2015    History of Present Illness Patient is a 64 year old female with hyperlipidemia, CVA, hypertension, type 2 diabetes mellitus, CAD, right AKA presented from home with acute shortness of breath, CHF    PT Comments    Pt progressing well with mobility, ambulated 100' with RW and min-guard A, but pt admits that she needs to be a bit stronger to go home by herself (sister's home no longer an option), so changing d/c recommendation to SNF for rehab. PT will continue to follow.   Follow Up Recommendations  SNF     Equipment Recommendations  None recommended by PT    Recommendations for Other Services       Precautions / Restrictions Precautions Precautions: Fall Precaution Comments: R AKA with prosthesis Restrictions Weight Bearing Restrictions: No    Mobility  Bed Mobility Overal bed mobility: Modified Independent                Transfers Overall transfer level: Needs assistance Equipment used: Rolling walker (2 wheeled) Transfers: Sit to/from Stand Sit to Stand: Supervision         General transfer comment: pt performed sit to stand from multiple surfaces (bed, BSC, recliner, chair), with supervision, no physical assistance needed   Ambulation/Gait Ambulation/Gait assistance: Min guard Ambulation Distance (Feet): 100 Feet Assistive device: Rolling walker (2 wheeled) Gait Pattern/deviations: Step-to pattern;Decreased stride length Gait velocity: decreased Gait velocity interpretation: <1.8 ft/sec, indicative of risk for recurrent falls General Gait Details: pt took one seated rest break, HR up to 150 with ambulation, partially from exertion and partially because pt emotional and crying about d/c issues. Occasional dragging of RLE. Fwd flexion at trunk   Stairs            Wheelchair Mobility    Modified Rankin (Stroke Patients  Only)       Balance Overall balance assessment: Needs assistance Sitting-balance support: No upper extremity supported Sitting balance-Leahy Scale: Good Sitting balance - Comments: pt able to don prosthesis in sitting with leaning fwd and reaching and no LOB   Standing balance support: Single extremity supported Standing balance-Leahy Scale: Poor Standing balance comment: pt able to stand with wt on LLE to push into prosthesis on right and let go of RW with right hand to assist in donning without LOB                    Cognition Arousal/Alertness: Awake/alert Behavior During Therapy: WFL for tasks assessed/performed Overall Cognitive Status: Within Functional Limits for tasks assessed                      Exercises      General Comments General comments (skin integrity, edema, etc.): Pt upset about going back to SNF but reports that she knows that she needs to get stronger to get home.  HR up to 150 with ambulation, 117 bpm at rest after session. O2 sats 100% on RA      Pertinent Vitals/Pain Pain Assessment: No/denies pain  HR up to 150 bpm with ambulation    Home Living                      Prior Function            PT Goals (current goals can now be found in the care plan section) Acute Rehab PT Goals Patient Stated Goal:  to go home PT Goal Formulation: With patient Time For Goal Achievement: 04/24/15 Potential to Achieve Goals: Good Progress towards PT goals: Progressing toward goals    Frequency  Min 3X/week    PT Plan Discharge plan needs to be updated    Co-evaluation             End of Session Equipment Utilized During Treatment: Gait belt Activity Tolerance: Patient tolerated treatment well Patient left: in chair;with call bell/phone within reach     Time: 1214-1249 PT Time Calculation (min) (ACUTE ONLY): 35 min  Charges:  $Gait Training: 23-37 mins                    G Codes:     Leighton Roach, PT  Acute Rehab  Services  [phone]  Leighton Roach 04/22/2015, 2:29 PM

## 2015-04-22 NOTE — Clinical Social Work Note (Signed)
Clinical Social Work Assessment  Patient Details  Name: Jenna Brown MRN: [id number] Date of Birth: [birth year]  Date of referral:  04/22/15               Reason for consult:  Facility Placement                Permission sought to share information with:  Family Supports, Customer service manager Permission granted to share information::  Yes, Verbal Permission Granted  Name::     [facility]'s and sister [place]  [phone]     Housing/Transportation Living arrangements for the past 2 months:  Apartment Source of Information:  Patient, Other (Comment Required) (Sister Ivin Booty) Patient Interpreter Needed:  None Criminal Activity/Legal Involvement Pertinent to Current Situation/Hospitalization:  No - Comment as needed Significant Relationships:  Siblings Lives with:  Self Do you feel safe going back to the place where you live?  Yes Need for family participation in patient care:  Yes (Comment)  Care giving concerns: Patient lived alone and states she was self sufficient of her ADL's prior to hospitalization. Her sister and other family members helped her as needed.  History of SNF placement about 3 years ago at [facility].   Social Worker assessment / plan:  CSW received referral to assist this 89 year of female with SNF placement. She was scheduled for d/c yesterday afternoon to return home- however when her sister Ivin Booty was notified re: discharge she indicated that she did not feel she could manage patients care and refused to come and pick her up. MD was notified by nursing and it appears that d/c was cancelled; orders were placed for CIR referral and SW for SNF placement. RNCM notified unit CSW of need for SNF placement yesterday late afternoon.  CSW met with patient at bedside and spoke to her sister Ivin Booty via phone per patient's permission. Sister states that she became alarmed yesterday when "someone" called her and indicated that her sister had to have  24 hour care at home and that she required a great deal of personal care and assistance. Sister indicated that she lives at home with her husband and she is very busy and cannot provide this type of care for her sister.  She is asking for short term SNF.  Discussed with patient who became quite distressed during the interview- she began holding her head and rocking back and forth in the bed. Patient was slightly tearful stating "I don't want to be a burden to anyone".  She does not really want to go to a SNF but defers to her sister's judgement.  CSW discussed possible SNF options. She has been at [facility] the past and agrees to go back there if they will accept her. Note:  PT is NOT recommending SNF for patient.      Employment status:  Disabled (Comment on whether or not currently receiving Disability) Insurance information:  Medicare PT Recommendations:  Home with [place] / Referral to community resources:  [place]  Patient/Family's Response to care:  Sister is requesting short term SNF for patient to get stronger. She does not feel that she can safely live alone at this time    Patient/Family's Understanding of and Emotional Response to Diagnosis, Current Treatment, and Prognosis:  Discussed with patient who became quite distressed during the interview- she began holding her head and rocking back and forth in the bed. Patient was slightly tearful stating "I  don't want to be a burden to anyone".  She does not really want to go to a SNF but defers to her sister's judgement. Sister was relieve when CSW indicated that a SNF search would be initiated however discussed with patient and sister that PT is not recommending SNF; feels patient could benefit from home health for PT.  Sister disagrees with this and states that she was told that patient needed "24 hours supervision and assistance".  Discussed SNF criteria but sister remained adamant that she was told by MD and  hospital staff that Social Worker would find patient a nursing home bed.    Emotional Assessment Appearance:  Appears stated age Attitude/Demeanor/Rapport:  Apprehensive Affect (typically observed):  Quiet, Calm, Sad, Anxious Orientation:  Oriented to Self, Oriented to Place, Oriented to  Time, Oriented to Situation Alcohol / Substance use:  Tobacco Use (Former smoker in the past) Psych involvement (Current and /or in the community):  No (Comment)  Discharge Needs  Concerns to be addressed:  Care Coordination Readmission within the last 30 days:  No Current discharge risk:  Lack of support system Barriers to Discharge:  No Barriers Identified   Estill Bakes 04/22/2015, 9:25 PM

## 2015-04-22 NOTE — Progress Notes (Signed)
Occupational Therapy Treatment Patient Details Name: Jenna Brown MRN: [id number] DOB: August 18, 1951 Today's Date: 04/22/2015    History of present illness Patient is a 64 year old female with hyperlipidemia, CVA, hypertension, type 2 diabetes mellitus, CAD, right AKA presented from home with acute shortness of breath, CHF   OT comments  Pt with prosthesis on today, requiring supervision for stand pivot and standing activities and min guard to ambulate with RW around room.  Pt will need to be able to hop to get to her tub and toilet if she were to go back to her home as her w/c does not fit in her bathroom and she does not routinely use her prosthesis inside her home.  Pt's sister is unable to assist pt at d/c, pt will need ST rehab in SNF.  Follow Up Recommendations  SNF;Supervision/Assistance - 24 hour (pt is without supervision at home, will need SNF)    Equipment Recommendations       Recommendations for Other Services      Precautions / Restrictions Precautions Precautions: Fall Precaution Comments: R AKA with prosthesis Restrictions Weight Bearing Restrictions: No       Mobility Bed Mobility Up in chair                Transfers Overall transfer level: Needs assistance Equipment used: Rolling walker (2 wheeled) Transfers: Sit to/from Stand Sit to Stand: Supervision         General transfer comment: pt performed sit to stand from multiple surfaces (bed, BSC, recliner, chair), with supervision, no physical assistance needed     Balance Overall balance assessment: Needs assistance Sitting-balance support: No upper extremity supported Sitting balance-Leahy Scale: Good Sitting balance - Comments: pt able to don prosthesis in sitting with leaning fwd and reaching and no LOB   Standing balance support: Single extremity supported Standing balance-Leahy Scale: Poor Standing balance comment: pt able to stand with wt on LLE to push into prosthesis on right and let go  of RW with right hand to assist in donning without LOB                   ADL Overall ADL's : Needs assistance/impaired     Grooming: Wash/dry hands;Standing;Supervision/safety                   Toilet Transfer: Supervision/safety;Stand-pivot;RW;BSC   Toileting- Water quality scientist and Hygiene: Sit to/from stand;Supervision/safety       Functional mobility during ADLs: Supervision/safety;Rolling walker        Tourist information centre manager   Behavior During Therapy: [facility] for tasks assessed/performed Overall Cognitive Status: Within Functional Limits for tasks assessed                       Extremity/Trunk Assessment               Exercises     Shoulder Instructions       General Comments      Pertinent Vitals/ Pain       Pain Assessment: No/denies pain  Home Living                                          Prior Functioning/Environment  Frequency Min 2X/week     Progress Toward Goals  OT Goals(current goals can now be found in the care plan section)  Progress towards OT goals: Progressing toward goals  Acute Rehab OT Goals Patient Stated Goal: to go home  Plan Discharge plan needs to be updated    Co-evaluation                 End of Session     Activity Tolerance Patient tolerated treatment well   Patient Left in chair;with call bell/phone within reach   Nurse Communication          Time: [id number] OT Time Calculation (min): 22 min  Charges: OT General Charges $OT Visit: 1 Procedure OT Treatments $Self Care/Home Management : 8-22 mins  Malka So 04/22/2015, 3:16 PM  [phone]

## 2015-04-29 ENCOUNTER — Non-Acute Institutional Stay (SKILLED_NURSING_FACILITY): Payer: Medicare Other | Admitting: Nurse Practitioner

## 2015-04-29 DIAGNOSIS — E1121 Type 2 diabetes mellitus with diabetic nephropathy: Secondary | ICD-10-CM | POA: Diagnosis not present

## 2015-04-29 DIAGNOSIS — I10 Essential (primary) hypertension: Secondary | ICD-10-CM

## 2015-04-29 DIAGNOSIS — I2510 Atherosclerotic heart disease of native coronary artery without angina pectoris: Secondary | ICD-10-CM

## 2015-04-29 DIAGNOSIS — I214 Non-ST elevation (NSTEMI) myocardial infarction: Secondary | ICD-10-CM

## 2015-04-29 DIAGNOSIS — F419 Anxiety disorder, unspecified: Secondary | ICD-10-CM

## 2015-04-29 DIAGNOSIS — J9601 Acute respiratory failure with hypoxia: Secondary | ICD-10-CM

## 2015-04-29 DIAGNOSIS — I509 Heart failure, unspecified: Secondary | ICD-10-CM | POA: Diagnosis not present

## 2015-04-29 DIAGNOSIS — I2583 Coronary atherosclerosis due to lipid rich plaque: Secondary | ICD-10-CM

## 2015-04-29 NOTE — Progress Notes (Signed)
Patient ID: Jenna Brown, female   DOB: 06/06/1951, 64 y.o.   MRN: [id number]    Nursing Home Location:  [place] of Service: SNF (31)  PCP: No primary care provider on file.  Allergies  Allergen Reactions  . Plavix [Clopidogrel Bisulfate] Itching  . Codeine Other (See Comments)    "makes me feel strange"  . Tylenol With Codeine #3 [Acetaminophen-Codeine] Other (See Comments)    "doesn't make me feel right"  . Tylenol [Acetaminophen] Other (See Comments)    "Doesn't feel right"     Chief Complaint  Patient presents with  . Hospitalization Follow-up    HPI:  Patient is a 65 y.o. female seen today at [facility] for follow up hospitalization.  Pt with a history of noncompliance, HLD, HTN, CAD Diabetes Type 2 uncontrolled, CVA, Rt AKA. Pt was hospitalized from 7/22-04/21/15 due to shortness of breath and weight gain. During his hospitalization patient was found to have three-vessel disease which met criteria for CABG however patient was deemed to be a very poor candidate for CABG. Congestive heart failure team also felt that a second cardiac catheterization would not benefit patient and medical management would be the choice of care. Patient was diuresed aggressively and her respiratory/cardiac status improved significantly. Pt doing well since she has been at [place] and medications have been given approprietly. No edema, shortness of breath or chest pain at this time.   Review of Systems:  Review of Systems  Constitutional: Negative for activity change, appetite change, fatigue and unexpected weight change.  HENT: Negative for congestion and hearing loss.   Eyes: Negative.   Respiratory: Negative for cough and shortness of breath.   Cardiovascular: Negative for chest pain, palpitations and leg swelling.  Gastrointestinal: Negative for abdominal pain, diarrhea and constipation.  Genitourinary: Negative for dysuria and difficulty  urinating.  Musculoskeletal: Negative for myalgias and arthralgias.  Skin: Negative for color change and wound.  Neurological: Negative for dizziness and weakness.  Psychiatric/Behavioral: Negative for behavioral problems, confusion and agitation.    Past Medical History  Diagnosis Date  . Hyperlipidemia     takes Simvastatin daily  . Peripheral vascular disease   . Stroke     TIA history  . PONV (postoperative nausea and vomiting)   . Hypertension     takes Amlodipine/HCTZ/Losartan daily  . Vertigo     takes Ativert prn  . Hemorrhoids   . Anxiety     takes Xanax prn  . Myocardial infarction 1990's  . Type II diabetes mellitus     takes Glucovance and Januvia daily  . H/O hiatal hernia   . GERD (gastroesophageal reflux disease)   . Headache(784.0)     when b/p is elevated  . Migraines   . Arthritis    Past Surgical History  Procedure Laterality Date  . Cleft palate repair      several  . Pr vein bypass graft,aorto-fem-pop  05/27/11    Right SFA-Below knee Pop BP  . Tonsillectomy    . Dilation and curettage of uterus    . Aortogram  02/2012  . Colonoscopy    . Femoral-tibial bypass graft  03/29/2012    Procedure: BYPASS GRAFT FEMORAL-TIBIAL ARTERY;  Surgeon: [name], MD;  Location: [place] OR;  Service: Vascular;  Laterality: Right;  Right femoral to Posterior Tibialis with composite graft of 67m x 80 cm ringed gortex graft and saphenous vein  ,intraoperative arteriogram  . Femoral-popliteal bypass  graft  04/05/2012    Procedure: BYPASS GRAFT FEMORAL-POPLITEAL ARTERY;  Surgeon: [name], MD;  Location: [place];  Service: Vascular;  Laterality: Right;  REVISION  . Myomectomy    . Tubal ligation  ~ 1990  . Femoropopliteal thrombectomy / embolectomy  05/09/2012  . Amputation  05/10/2012    Procedure: AMPUTATION ABOVE KNEE;  Surgeon: [name], MD;  Location: [facility] OR;  Service: Vascular;  Laterality: Right;   open right groin wound noted  . Abdominal aortagram N/A  02/29/2012    Procedure: ABDOMINAL Maxcine Ham;  Surgeon: [name], MD;  Location: [facility] CATH LAB;  Service: Cardiovascular;  Laterality: N/A;  . Cardiac catheterization N/A 04/13/2015    Procedure: Right/Left Heart Cath and Coronary Angiography;  Surgeon: [name], MD;  Location: [place] CV LAB;  Service: Cardiovascular;  Laterality: N/A;   Social History:   reports that she quit smoking about 3 years ago. Her smoking use included Cigarettes. She has a 10 pack-year smoking history. She has never used smokeless tobacco. She reports that she does not drink alcohol or use illicit drugs.  Family History  Problem Relation Age of Onset  . Cancer Mother     breast  . Diabetes Father     Medications: Patient's Medications  New Prescriptions   No medications on file  Previous Medications   ALPRAZOLAM (XANAX) 0.25 MG TABLET    Take 0.25 mg by mouth 3 (three) times daily as needed for anxiety.    ASPIRIN 81 MG CHEWABLE TABLET    Chew 81 mg by mouth daily.   DIGOXIN (LANOXIN) 0.125 MG TABLET    Take 1 tablet (0.125 mg total) by mouth daily.   FUROSEMIDE (LASIX) 40 MG TABLET    Take 1 tablet (40 mg total) by mouth daily.   HYDRALAZINE (APRESOLINE) 25 MG TABLET    Take 1 tablet (25 mg total) by mouth 3 (three) times daily.   INSULIN ASPART (NOVOLOG FLEXPEN) 100 UNIT/ML FLEXPEN    Inject 22 Units into the skin 3 (three) times daily with meals.   INSULIN GLARGINE (LANTUS SOLOSTAR) 100 UNIT/ML SOLOSTAR PEN    Inject 45 Units into the skin daily at 10 pm.   INSULIN PEN NEEDLE (AURORA PEN NEEDLES) 29G X 12MM MISC    22 Units by Does not apply route 3 (three) times daily before meals.   ISOSORBIDE MONONITRATE (IMDUR) 30 MG 24 HR TABLET    Take 1 tablet (30 mg total) by mouth daily.   LEVALBUTEROL (XOPENEX) 1.25 MG/0.5ML NEBULIZER SOLUTION    Take 1.25 mg by nebulization 3 (three) times daily as needed for wheezing or shortness of breath.   LOSARTAN (COZAAR) 25 MG TABLET    Take 0.5 tablets  (12.5 mg total) by mouth daily.   MECLIZINE (ANTIVERT) 25 MG TABLET    Take 25 mg by mouth daily as needed for dizziness.    ROSUVASTATIN (CRESTOR) 20 MG TABLET    Take 1 tablet (20 mg total) by mouth daily at 6 PM.   SPIRONOLACTONE (ALDACTONE) 25 MG TABLET    Take 1 tablet (25 mg total) by mouth daily.   VITAMIN C (ASCORBIC ACID) 500 MG TABLET    Take 500 mg by mouth daily.   ZINC SULFATE 220 MG CAPSULE    Take 220 mg by mouth daily.  Modified Medications   No medications on file  Discontinued Medications   No medications on file     Physical Exam: Danley Danker  Vitals:   04/29/15 1156  BP: 107/56  Pulse: 90  Temp: 97.3 F (36.3 C)  Resp: 18  Weight: 158 lb (71.668 kg)    Physical Exam  Constitutional: She is oriented to person, place, and time. No distress.  HENT:  Head: Normocephalic and atraumatic.  Nose: Nose normal.  Mouth/Throat: Oropharynx is clear and moist. No oropharyngeal exudate.  Eyes: Conjunctivae are normal. Pupils are equal, round, and reactive to light.  Neck: Normal range of motion. Neck supple.  Cardiovascular: Normal rate, regular rhythm and normal heart sounds.   Pulmonary/Chest: Effort normal and breath sounds normal.  Abdominal: Soft. Bowel sounds are normal.  Musculoskeletal: She exhibits no edema or tenderness.  Right aka  Neurological: She is alert and oriented to person, place, and time.  Skin: Skin is warm and dry. She is not diaphoretic.  Psychiatric: She has a normal mood and affect.    Labs reviewed: Basic Metabolic Panel:  Recent Labs  04/20/15 0315 04/21/15 0320 04/22/15 0300  NA 140 137 141  K 4.5 4.6 4.5  CL 105 99* 105  CO2 '29 29 28  ' GLUCOSE 86 165* 72  BUN 27* 32* 28*  CREATININE 1.15* 1.27* 1.24*  CALCIUM 9.3 9.0 9.1  MG 1.8 2.2 2.0   Liver Function Tests: No results for input(s): AST, ALT, ALKPHOS, BILITOT, PROT, ALBUMIN in the last 8760 hours. No results for input(s): LIPASE, AMYLASE in the last 8760 hours. No results for  input(s): AMMONIA in the last 8760 hours. CBC:  Recent Labs  04/12/15 1140 04/13/15 0225 04/21/15 0320  WBC 8.9 9.1 9.1  HGB 10.1* 9.9* 8.7*  HCT 31.4* 30.9* 28.6*  MCV 79.7 79.6 81.9  PLT 367 340 363   TSH: No results for input(s): TSH in the last 8760 hours. A1C: Lab Results  Component Value Date   HGBA1C 11.3* 04/10/2015   Lipid Panel:  Recent Labs  04/12/15 0450  CHOL 216*  HDL 35*  LDLCALC 153*  TRIG 142  CHOLHDL 6.2    Radiological Exams: Dg Chest Port 1 View  04/11/2015   CLINICAL DATA:  CHF  EXAM: PORTABLE CHEST - 1 VIEW  COMPARISON:  04/10/2015  FINDINGS: Cardiac shadow is stable and mildly enlarged. Bilateral pleural effusions and bibasilar airspace opacities are seen stable from the prior exam. The overall inspiratory effort has improved however. No bony abnormality is noted.  IMPRESSION: Stable bibasilar changes.   Electronically Signed   By: Inez Catalina M.D.   On: 04/11/2015 13:38   Dg Chest Port 1 View  04/10/2015   CLINICAL DATA:  Shortness of breath for 3 days. Cough for 3 days. Former smoker.  EXAM: PORTABLE CHEST - 1 VIEW  COMPARISON:  05/10/2012  FINDINGS: The cardiac silhouette is obscured. Extremely diminished lung volumes with increased density at both lung bases and crowding/ accentuation of the bronchovascular markings in the perihilar and apical regions. No pneumothorax. No acute osseous abnormality is identified.  IMPRESSION: Low lung volumes with increased density in the lung bases, which may represent a combination of atelectasis or consolidation and pleural effusions. Dedicated two-view chest radiographs recommended when clinically able.   Electronically Signed   By: Logan Bores   On: 04/10/2015 09:16    Assessment/Plan 1. Acute on chronic congestive heart failure, unspecified congestive heart failure type Pt diuresed during hospitalization. Currently stable, conts on lasix and spironolactone. conts on losartan  7/23 echocardiogram;-  LVEF=20%. The entire mid-ventricle and apex are akinetic.(could be a Takotsubo ventricle).  2. Coronary artery disease due to lipid rich plaque -NSTEMI with recently hospitalization. Medical management at this time because pt is not surgery candidate.  -conts on ASA 81 mg daily  3. Essential hypertension Blood pressure stable, to cont current regimen   4. NSTEMI (non-ST elevated myocardial infarction) S/P Cardiac catheterization showing three-vessel disease; per cardiothoracic surgery pt is not candidate for CABG or catheterization -medical management at this time, to cont digoxin 0.125 mg daily, furosemide 40 mg daily, hydralazine 12.5 mg TID, imdur 30 mg daily, losartan 12.5 mg daily, spironolactone 25 mg daily -pt to follow up with Dr Haroldine Laws for heart failure clinic  5. Acute respiratory failure with hypoxia Resolved, due to NSTEMI, CAP CHF exacerbation.   6. Type 2 diabetes mellitus with diabetic nephropathy A1c not controlled outpatient, currently getting lantus 45 units daily and novolog 22 units AC, blood sugars have been stable, fasting ranging from 97-200.   7. Anxiety Cont on PRN xanax  8. Hyperkalemia -potassium on recent lab elevated at 5.6, currently on aldactone and losartan. Also conts on lasix -will follow up bmp at this time     Carlos American. Harle Battiest  [facility] [phone] 8 am - 5 pm) [phone] (after hours)

## 2015-05-04 ENCOUNTER — Encounter (HOSPITAL_COMMUNITY): Payer: Medicare Other

## 2015-05-05 ENCOUNTER — Non-Acute Institutional Stay (SKILLED_NURSING_FACILITY): Payer: Medicare Other | Admitting: Internal Medicine

## 2015-05-05 DIAGNOSIS — N182 Chronic kidney disease, stage 2 (mild): Secondary | ICD-10-CM | POA: Diagnosis not present

## 2015-05-05 DIAGNOSIS — E1121 Type 2 diabetes mellitus with diabetic nephropathy: Secondary | ICD-10-CM | POA: Diagnosis not present

## 2015-05-05 DIAGNOSIS — I255 Ischemic cardiomyopathy: Secondary | ICD-10-CM

## 2015-05-05 DIAGNOSIS — E0851 Diabetes mellitus due to underlying condition with diabetic peripheral angiopathy without gangrene: Secondary | ICD-10-CM | POA: Diagnosis not present

## 2015-05-05 NOTE — Progress Notes (Signed)
Patient ID: Jenna Brown, female   DOB: [birth year], 64 y.o.   MRN: [id number]    Facility; [facility] Chief complaint; admission to SNF post stay at [facility] 7/22 through 8/2 History; this is a patient who was admitted to hospital with acute respiratory failure and hypoxia. This was felt to be due to several issues including congestive heart failure, non-ST elevation MI, and mini acquired pneumonia. Her admission weight was 72.5 kg at discharge 72 kg. She was also listed as being noncompliant with a medical regimen. She underwent an echocardiogram on 7/23 that showed a left ventricular ejection fraction of 20%. The entire mid ventricle and apex were akinetic. She underwent cardiac catheterization on 7/25 she had a 90% proximal LAD, 95% ostial circumflex, 60% proximal right coronary artery 50% mid right coronary artery mid circumflex 90%. She was treated with Rocephin and azithromycin stopped on 7/28 blood cultures and urine cultures were negative. With aggressive diuresis her respiratory status improved.  As listed below there were apparently medication compliance issues at home. Hemoglobin A1c was 11.3.  Past Medical History  Diagnosis Date  . Hyperlipidemia     takes Simvastatin daily  . Peripheral vascular disease   . Stroke     TIA history  . PONV (postoperative nausea and vomiting)   . Hypertension     takes Amlodipine/HCTZ/Losartan daily  . Vertigo     takes Ativert prn  . Hemorrhoids   . Anxiety     takes Xanax prn  . Myocardial infarction 1990's  . Type II diabetes mellitus     takes Glucovance and Januvia daily  . H/O hiatal hernia   . GERD (gastroesophageal reflux disease)   . Headache(784.0)     when b/p is elevated  . Migraines   . Arthritis    Past Surgical History  Procedure Laterality Date  . Cleft palate repair      several  . Pr vein bypass graft,aorto-fem-pop  05/27/11    Right SFA-Below knee Pop BP  . Tonsillectomy    . Dilation and curettage of uterus     . Aortogram  02/2012  . Colonoscopy    . Femoral-tibial bypass graft  03/29/2012    Procedure: BYPASS GRAFT FEMORAL-TIBIAL ARTERY;  Surgeon: [name], MD;  Location: [facility];  Service: Vascular;  Laterality: Right;  Right femoral to Posterior Tibialis with composite graft of 6mm x 80 cm ringed gortex graft and saphenous vein  ,intraoperative arteriogram  . Femoral-popliteal bypass graft  04/05/2012    Procedure: BYPASS GRAFT FEMORAL-POPLITEAL ARTERY;  Surgeon: [name], MD;  Location: [facility];  Service: Vascular;  Laterality: Right;  REVISION  . Myomectomy    . Tubal ligation  ~ 1990  . Femoropopliteal thrombectomy / embolectomy  05/09/2012  . Amputation  05/10/2012    Procedure: AMPUTATION ABOVE KNEE;  Surgeon: [name], MD;  Location: [facility] OR;  Service: Vascular;  Laterality: Right;   open right groin wound noted  . Abdominal aortagram N/A 02/29/2012    Procedure: ABDOMINAL Maxcine Ham;  Surgeon: [name], MD;  Location: [facility] CATH LAB;  Service: Cardiovascular;  Laterality: N/A;  . Cardiac catheterization N/A 04/13/2015    Procedure: Right/Left Heart Cath and Coronary Angiography;  Surgeon: [name], MD;  Location: [place] CV LAB;  Service: Cardiovascular;  Laterality: N/A;      Lab Results  Component Value Date   CREATININE 1.24* 04/22/2015   CREATININE 1.27* 04/21/2015   CREATININE 1.15* 04/20/2015  CBC Latest Ref Rng 04/21/2015 04/13/2015 04/12/2015  WBC 4.0 - 10.5 K/uL 9.1 9.1 8.9  Hemoglobin 12.0 - 15.0 g/dL 8.7(L) 9.9(L) 10.1(L)  Hematocrit 36.0 - 46.0 % 28.6(L) 30.9(L) 31.4(L)  Platelets 150 - 400 K/uL 363 340 367    Lab Results  Component Value Date   TROPONINI 0.27* 04/12/2015          CLINICAL DATA:  Shortness of breath.  Low O2 sats.   EXAM: CHEST  2 VIEW   COMPARISON:  04/12/2015   FINDINGS: Heart is normal size. There are low lung volumes with bibasilar airspace opacities, left greater than right, likely atelectasis.  No significant effusions. No acute bony abnormality.   IMPRESSION: Bibasilar opacities, left greater than right, likely atelectasis.     Electronically Signed   By: Rolm Baptise M.D.   On: 04/18/2015 13:12                                       *[facility]*                         [address], [place] [address]                            [phone]  ------------------------------------------------------------------- Transthoracic Echocardiography  Patient:    Jenna Brown, Jenna Brown MR #:       [id number] Study Date: 04/11/2015 Gender:     F Age:        69 Height:     162.6 cm Weight:     68 kg BSA:        1.77 m^2 Pt. Status: Room:       2H23C   ADMITTING    Rai, Ripudeep K  ORDERING     Rai, Ripudeep K  REFERRING    Rai, Ripudeep K  PERFORMING   [facility], Inpatient  ATTENDING    [place], Md  SONOGRAPHER  Mikki Santee  cc:  -------------------------------------------------------------------  ------------------------------------------------------------------- Indications:      Dyspnea 786.09.  ------------------------------------------------------------------- History:   PMH:   Congestive heart failure.  Stroke.  PMH: Myocardial infarction.  Risk factors:  Hypertension. Diabetes mellitus. Dyslipidemia.  ------------------------------------------------------------------- Study Conclusions  - Left ventricle: The study is technically difficult. The EF is   20%. The entire mid-ventricle and apex are akinetic. There is   slight motion at the base. I can not rule out some clot at the   apex. This could be a Takotsubo ventricle. - Aortic valve: Sclerosis without stenosis. There was no   significant regurgitation. - Right ventricle: The cavity size was normal. Systolic function   was normal.  Transthoracic echocardiography.  M-mode, complete 2D, spectral Doppler, and color Doppler.   Birthdate:  Patient birthdate: 1951/02/26.  Age:  Patient is 64 yr old.  Sex:  Gender: female. BMI: 25.7 kg/m^2.  Blood pressure:     119/74  Patient status: Inpatient.  Study date:  Study date: 04/11/2015. Study time: 11:35 AM.  Location:  ICU/CCU  -------------------------------------------------------------------  ------------------------------------------------------------------- Left ventricle:  The study is technically  difficult. The EF is 20%. The entire mid-ventricle and apex are akinetic. There is slight motion at the base. I can not rule out some clot at the apex. This could be a Takotsubo ventricle.  ------------------------------------------------------------------- Aortic valve:  Sclerosis without stenosis.  Doppler:  There was no significant regurgitation.  ------------------------------------------------------------------- Aorta:  Aortic root: The aortic root was normal in size.  ------------------------------------------------------------------- Mitral valve:   Structurally normal valve.   Leaflet separation was normal.  Doppler:  Transvalvular velocity was within the normal range. There was no evidence for stenosis. There was no regurgitation.  ------------------------------------------------------------------- Left atrium:  The atrium was at the upper limits of normal in size.   ------------------------------------------------------------------- Right ventricle:  The cavity size was normal. Systolic function was normal.  ------------------------------------------------------------------- Pulmonic valve:    The valve appears to be grossly normal. Doppler:  There was no significant regurgitation.  ------------------------------------------------------------------- Tricuspid valve:   Structurally normal valve.   Leaflet separation was normal.  Doppler:  Transvalvular velocity was within the normal range. There was no  regurgitation.  ------------------------------------------------------------------- Right atrium:  Poorly visualized.  ------------------------------------------------------------------- Pericardium:  There was no pericardial effusion.    Current Outpatient Prescriptions on File Prior to Visit  Medication Sig Dispense Refill  . ALPRAZolam (XANAX) 0.25 MG tablet Take 0.25 mg by mouth 3 (three) times daily as needed for anxiety.     Marland Kitchen aspirin 81 MG chewable tablet Chew 81 mg by mouth daily.    . digoxin (LANOXIN) 0.125 MG tablet Take 1 tablet (0.125 mg total) by mouth daily. 30 tablet 0  . furosemide (LASIX) 40 MG tablet Take 1 tablet (40 mg total) by mouth daily. 30 tablet 0  . hydrALAZINE (APRESOLINE) 25 MG tablet Take 1 tablet (25 mg total) by mouth 3 (three) times daily. 90 tablet 0  . insulin aspart (NOVOLOG FLEXPEN) 100 UNIT/ML FlexPen Inject 22 Units into the skin 3 (three) times daily with meals. 15 mL 11  . Insulin Glargine (LANTUS SOLOSTAR) 100 UNIT/ML Solostar Pen Inject 45 Units into the skin daily at 10 pm. 15 mL 11  . Insulin Pen Needle (AURORA PEN NEEDLES) 29G X 12MM MISC 22 Units by Does not apply route 3 (three) times daily before meals. 100 each 0  . isosorbide mononitrate (IMDUR) 30 MG 24 hr tablet Take 1 tablet (30 mg total) by mouth daily. 30 tablet 0  . levalbuterol (XOPENEX) 1.25 MG/0.5ML nebulizer solution Take 1.25 mg by nebulization 3 (three) times daily as needed for wheezing or shortness of breath. 1 each 12  . losartan (COZAAR) 25 MG tablet Take 0.5 tablets (12.5 mg total) by mouth daily. 30 tablet 0  . meclizine (ANTIVERT) 25 MG tablet Take 25 mg by mouth daily as needed for dizziness.     . rosuvastatin (CRESTOR) 20 MG tablet Take 1 tablet (20 mg total) by mouth daily at 6 PM. 30 tablet 0  . spironolactone (ALDACTONE) 25 MG tablet Take 1 tablet (25 mg total) by mouth daily. 30 tablet 0  . vitamin C (ASCORBIC ACID) 500 MG tablet Take 500 mg by mouth daily.    Marland Kitchen  zinc sulfate 220 MG capsule Take 220 mg by mouth daily.      Social; from what I am able to gather the patient was living independently. There would appear to have been frustration expressed with the patient's noncompliance and the patient was to go home living with family members [sister]. There are no advanced direct  reports that she quit smoking  about 3 years ago. Her smoking use included Cigarettes. She has a 10 pack-year smoking history. She has never used smokeless tobacco. She reports that she does not drink alcohol or use illicit drugs.   family history includes Cancer in her mother; Diabetes in her father.   Review of systems; Gen; no weight gain is obvious to the patient HEENT; states she went to the eye doctor 3 months ago and does not have diabetic eye changes. States her vision is clear  Respiratory; no shortness of breath, no cough, Cardiac; no exertional chest pain, no palpitations GI; no abnormal pain, no change in bowel habits GU; no dysuria, no voiding difficulties Musculoskeletal; no joint pain Vascular; previously a right above-knee amputation. She describes an aching discomfort in her calf while walking with her above-knee prosthesis which forces her to stop twice walking from physical therapy to her room Neurologic; no focal weakness or complaints of numbness Mental status; she does not complain of depressive symptoms  Physical examination Gen. the patient is not in any distress alert and cooperative. Her current weight is 154.5 pounds which translates to 70 kg down from 72 kg on discharge from the hospital. Vitals temperature 96.8, blood pressure 130/80, pulse 104, respiratory weight 20 O2 sat is 98% on room air HEENT oral exam is normal Lymph; none palpable in the cervical clavicular or axillary areas Respiratory; shallow but clear air entry bilaterally no wheezing Cardiac; heart sounds are distant no murmurs no S3 JVP is not elevated Abdomen; no liver no spleen  no tenderness no masses GU; bladder is not distended there is no tenderness no CVA tenderness Extremities; proximal right above-knee amputation site looks normal; there is no edema present in her remaining left leg or her coccyx Vascular; I cannot palpate any peripheral pulses in this lady's left leg at all. 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 She was seen by cardiovascular surgery and not felt to be an operative candidate. She is not currently complaining of exertional chest symptomatology #2 congestive heart failure; this seems stable at the bedside. Her weight is down 2 kg since she left the hospital #3 mildly tachycardic in spite of the fact she is on digoxin. I think she might benefit from a low-dose of a beta blocker #4 type 2 diabetes on insulin and oral agents  with chronic renal failure, macrovascular disease and nephropathy. I note that she is not on routine insulin here. This may have been a facility transfer air. There is fairly clear instructions that she should've been on Lantus 45 units at night and NovoLog 22 units before meals meals. I have reviewed her blood sugars yesterday's were 179/100/175/206. In addition to the glyburide/metformin 5/502 by mouth twice a day she was put on a sliding scale when she first came into this facility #5 hyperkalemia her potassium was 5.6 on 8/5 BUN at 32 creatinine of 1.26 this does not appear to be followed up upon that I can see. She is not on potassium however is on as small dose of Aldactone on top of her chronic renal insufficiency. This will need to be rechecked tomorrow #6 I suspect she is describing claudication in her left leg when walking from physical therapy with her right above-knee prosthesis. I do not believe she would be a candidate for an aggressive  workup at this point   Going  to start the patient on a low-dose of Lantus at night. Stat repeat of her basic metabolic panel tomorrow a.m. I also see she was supposed to follow-up in the congestive heart failure clinic today. I believe that appointment was missed  Results for Jenna Brown, Jenna Brown (MRN [id number]) as of 05/05/2015 10:49  Ref. Range 04/21/2015 03:20 04/21/2015 05:00 04/21/2015 07:58 04/21/2015 11:27 04/21/2015 17:18 04/21/2015 21:13 04/22/2015 03:00 04/22/2015 07:41  Glucose-Capillary Latest Ref Range: 65-99 mg/dL   156 (H) 192 (H) 64 (L) 294 (H)  105 (H)  Total hemoglobin Latest Ref Range: 12.0-16.0 g/dL  8.9 (L)        Carboxyhemoglobin Latest Ref Range: 0.5-1.5 %  1.9 (H)        Methemoglobin Latest Ref Range: 0.0-1.5 %  1.3        O2 Saturation Latest Units: %  63.4        Sodium Latest Ref Range: 135-145 mmol/L 137      141   Potassium Latest Ref Range: 3.5-5.1 mmol/L 4.6      4.5   Chloride Latest Ref Range: 101-111 mmol/L 99 (L)      105   CO2 Latest Ref Range: 22-32 mmol/L 29      28   BUN Latest Ref Range: 6-20 mg/dL 32 (H)      28 (H)   Creatinine Latest Ref Range: 0.44-1.00 mg/dL 1.27 (H)      1.24 (H)   Calcium Latest Ref Range: 8.9-10.3 mg/dL 9.0      9.1   EGFR (Non-African Amer.) Latest Ref Range: >60 mL/min 45 (L)      46 (L)   EGFR (African American) Latest Ref Range: >60 mL/min 52 (L)      53 (L)   Glucose Latest Ref Range: 65-99 mg/dL 165 (H)      72   Anion gap Latest Ref Range: 5-_0 Magnesium Latest Ref Range: 1.7-2.4 mg/dL 2.2      2.0   WBC Latest Ref Range: 4.0-10.5 K/uL 9.1         RBC Latest Ref Range: 3.87-5.11 MIL/uL 3.49 (L)         Hemoglobin Latest Ref Range: 12.0-15.0 g/dL 8.7 (L)         HCT Latest Ref Range: 36.0-46.0 % 28.6 (L)         MCV Latest Ref Range: 78.0-100.0 fL 81.9         MCH Latest Ref Range: 26.0-34.0 pg 24.9 (L)         MCHC Latest Ref Range: 30.0-36.0 g/dL 30.4         RDW Latest Ref Range: 11.5-15.5 % 14.9         Platelets Latest  Ref Range: 150-400 K/uL 363

## 2015-05-14 ENCOUNTER — Ambulatory Visit (HOSPITAL_COMMUNITY)
Admission: RE | Admit: 2015-05-14 | Discharge: 2015-05-14 | Disposition: A | Discharge status: Home / Self Care | Payer: Medicare Other | Source: Ambulatory Visit | Attending: Cardiology | Admitting: Cardiology

## 2015-05-14 ENCOUNTER — Encounter (HOSPITAL_COMMUNITY): Payer: Self-pay

## 2015-05-14 VITALS — BP 114/72 | HR 90

## 2015-05-14 DIAGNOSIS — Z8673 Personal history of transient ischemic attack (TIA), and cerebral infarction without residual deficits: Secondary | ICD-10-CM | POA: Insufficient documentation

## 2015-05-14 DIAGNOSIS — Z87891 Personal history of nicotine dependence: Secondary | ICD-10-CM | POA: Insufficient documentation

## 2015-05-14 DIAGNOSIS — I255 Ischemic cardiomyopathy: Secondary | ICD-10-CM | POA: Insufficient documentation

## 2015-05-14 DIAGNOSIS — F419 Anxiety disorder, unspecified: Secondary | ICD-10-CM | POA: Insufficient documentation

## 2015-05-14 DIAGNOSIS — I5022 Chronic systolic (congestive) heart failure: Secondary | ICD-10-CM | POA: Diagnosis not present

## 2015-05-14 DIAGNOSIS — Z833 Family history of diabetes mellitus: Secondary | ICD-10-CM | POA: Diagnosis not present

## 2015-05-14 DIAGNOSIS — I739 Peripheral vascular disease, unspecified: Secondary | ICD-10-CM | POA: Insufficient documentation

## 2015-05-14 DIAGNOSIS — K219 Gastro-esophageal reflux disease without esophagitis: Secondary | ICD-10-CM | POA: Diagnosis not present

## 2015-05-14 DIAGNOSIS — E119 Type 2 diabetes mellitus without complications: Secondary | ICD-10-CM | POA: Diagnosis not present

## 2015-05-14 DIAGNOSIS — I509 Heart failure, unspecified: Secondary | ICD-10-CM | POA: Diagnosis not present

## 2015-05-14 DIAGNOSIS — I10 Essential (primary) hypertension: Secondary | ICD-10-CM | POA: Insufficient documentation

## 2015-05-14 DIAGNOSIS — Z7982 Long term (current) use of aspirin: Secondary | ICD-10-CM | POA: Insufficient documentation

## 2015-05-14 DIAGNOSIS — Z89611 Acquired absence of right leg above knee: Secondary | ICD-10-CM | POA: Diagnosis not present

## 2015-05-14 DIAGNOSIS — Z794 Long term (current) use of insulin: Secondary | ICD-10-CM | POA: Insufficient documentation

## 2015-05-14 DIAGNOSIS — Z79899 Other long term (current) drug therapy: Secondary | ICD-10-CM | POA: Diagnosis not present

## 2015-05-14 DIAGNOSIS — I252 Old myocardial infarction: Secondary | ICD-10-CM | POA: Insufficient documentation

## 2015-05-14 DIAGNOSIS — I5042 Chronic combined systolic (congestive) and diastolic (congestive) heart failure: Secondary | ICD-10-CM | POA: Insufficient documentation

## 2015-05-14 DIAGNOSIS — G459 Transient cerebral ischemic attack, unspecified: Secondary | ICD-10-CM | POA: Insufficient documentation

## 2015-05-14 DIAGNOSIS — E785 Hyperlipidemia, unspecified: Secondary | ICD-10-CM | POA: Diagnosis not present

## 2015-05-14 DIAGNOSIS — G458 Other transient cerebral ischemic attacks and related syndromes: Secondary | ICD-10-CM | POA: Diagnosis not present

## 2015-05-14 LAB — BASIC METABOLIC PANEL
Anion gap: 8 (ref 5–15)
BUN: 25 mg/dL — ABNORMAL HIGH (ref 6–20)
CALCIUM: 9.1 mg/dL (ref 8.9–10.3)
CO2: 24 mmol/L (ref 22–32)
Chloride: 110 mmol/L (ref 101–111)
Creatinine, Ser: 1.1 mg/dL — ABNORMAL HIGH (ref 0.44–1.00)
GFR calc Af Amer: >60 mL/min (ref >60)
GFR, EST NON AFRICAN AMERICAN: 53 mL/min — AB (ref >60)
GLUCOSE: 170 mg/dL — AB (ref 65–99)
Potassium: 4.3 mmol/L (ref 3.5–5.1)
Sodium: 142 mmol/L (ref 135–145)

## 2015-05-14 LAB — CARBOXYHEMOGLOBIN
CARBOXYHEMOGLOBIN: 1.3 % (ref 0.5–1.5)
METHEMOGLOBIN: 0.9 % (ref 0.0–1.5)
O2 SAT: 66.9 %
Total hemoglobin: 9.6 g/dL — ABNORMAL LOW (ref 12.0–16.0)

## 2015-05-14 MED ORDER — DIGOXIN 125 MCG PO TABS: 0.1250 mg | ORAL_TABLET | Freq: Every day | ORAL | Status: DC

## 2015-05-14 MED ORDER — LOSARTAN POTASSIUM 25 MG PO TABS: 25.0000 mg | ORAL_TABLET | Freq: Every day | ORAL | Status: DC

## 2015-05-14 MED ORDER — FUROSEMIDE 40 MG PO TABS: 40.0000 mg | ORAL_TABLET | Freq: Every day | ORAL | Status: DC

## 2015-05-14 NOTE — Patient Instructions (Addendum)
STOP HCTZ, Amlodipine.  DECREASE Losartan to 25mg  once daily.  START Lasix 40mg  once every morning.  START Digoxin 0.125 mg tablet once daily.  Remove PICC line.  Follow up 2 weeks.  Do the following things EVERYDAY: 1) Weigh yourself in the morning before breakfast. Write it down and keep it in a log. 2) Take your medicines as prescribed 3) Eat low salt foods-Limit salt (sodium) to 2000 mg per day.  4) Stay as active as you can everyday 5) Limit all fluids for the day to less than 2 liters

## 2015-05-14 NOTE — Progress Notes (Signed)
Patient ID: Jenna Brown, female   DOB: 10-02-1950, 64 y.o.   MRN: [id number] PCP: Primary HF Cardiologist: Dr Jenna Brown   HPI:  Ms 64 year old female with hyperlipidemia, CVA, hypertension, type 2 diabetes mellitus, CAD, right AKA .   Admitted to [facility] 04/10/15 with increased dyspnea. Had RHC/LHC as noted below. Not candidate for revascularization per Dr Jenna Brown.  Required short term milrinone for cardiogenic shock. As she improved and fully diuresed, milrinone stopped. She had recommendations for lasix, spiro, losartan, hydralazine, and imdur.  Discharge weight was 158 pounds.      She returns for post hospital follow up. Denies SOB/PND/Orthopnea. Discharge weight was 158 pounds. Working with therapy. Wants to go home and liver with her sisters. Currently at [facility]     [place] 04/14/15  Prox LAD to Mid LAD lesion, 90% stenosed.  Ost Cx to Mid Cx lesion, 95% stenosed.  Ost 2nd Mrg to 2nd Mrg lesion, 80% stenosed.  Mid Cx lesion, 90% stenosed.  Prox RCA lesion, 60% stenosed.  Mid RCA lesion, 50% stenosed.       Labs 04/22/2015 K 4.5 Creatinine 1.24     ROS: All systems negative except as listed in HPI, PMH and Problem List.  SH:  Social History   Social History  . Marital Status: Single    Spouse Name: N/A  . Number of Children: N/A  . Years of Education: N/A   Occupational History  . Not on file.   Social History Main Topics  . Smoking status: Former Smoker -- 0.25 packs/day for 40 years    Types: Cigarettes    Quit date: 02/27/2012  . Smokeless tobacco: Never Used  . Alcohol Use: No  . Drug Use: No  . Sexual Activity: No   Other Topics Concern  . Not on file   Social History Narrative    FH:  Family History  Problem Relation Age of Onset  . Cancer Mother     breast  . Diabetes Father     Past Medical History  Diagnosis Date  . Hyperlipidemia     takes Simvastatin daily  . Peripheral vascular disease   . Stroke     TIA history  . PONV  (postoperative nausea and vomiting)   . Hypertension     takes Amlodipine/HCTZ/Losartan daily  . Vertigo     takes Ativert prn  . Hemorrhoids   . Anxiety     takes Xanax prn  . Myocardial infarction 1990's  . Type II diabetes mellitus     takes Glucovance and Januvia daily  . H/O hiatal hernia   . GERD (gastroesophageal reflux disease)   . Headache(784.0)     when b/p is elevated  . Migraines   . Arthritis     Current Outpatient Prescriptions  Medication Sig Dispense Refill  . ALPRAZolam (XANAX) 0.25 MG tablet Take 0.25 mg by mouth 3 (three) times daily as needed for anxiety.     Marland Kitchen amLODipine (NORVASC) 10 MG tablet Take 10 mg by mouth daily.    Marland Kitchen aspirin 81 MG chewable tablet Chew 81 mg by mouth daily.    . carvedilol (COREG) 3.125 MG tablet Take 3.125 mg by mouth 2 (two) times daily with a meal.    . glipiZIDE-metformin (METAGLIP) 5-500 MG per tablet Take 1 tablet by mouth 2 (two) times daily before a meal.    . hydrochlorothiazide (HYDRODIURIL) 25 MG tablet Take 25 mg by mouth daily.    . insulin aspart (NOVOLOG FLEXPEN)  100 UNIT/ML FlexPen Inject 22 Units into the skin 3 (three) times daily with meals. 15 mL 11  . Insulin Glargine (LANTUS SOLOSTAR) 100 UNIT/ML Solostar Pen Inject 45 Units into the skin daily at 10 pm. 15 mL 11  . Insulin Pen Needle (AURORA PEN NEEDLES) 29G X 12MM MISC 22 Units by Does not apply route 3 (three) times daily before meals. 100 each 0  . levalbuterol (XOPENEX) 1.25 MG/0.5ML nebulizer solution Take 1.25 mg by nebulization 3 (three) times daily as needed for wheezing or shortness of breath. 1 each 12  . losartan (COZAAR) 100 MG tablet Take 100 mg by mouth daily.    . meclizine (ANTIVERT) 25 MG tablet Take 25 mg by mouth daily as needed for dizziness.     . vitamin C (ASCORBIC ACID) 500 MG tablet Take 500 mg by mouth daily.    Marland Kitchen zinc sulfate 220 MG capsule Take 220 mg by mouth daily.    . digoxin (LANOXIN) 0.125 MG tablet Take 1 tablet (0.125 mg total)  by mouth daily. (Patient not taking: Reported on 05/14/2015) 30 tablet 0  . furosemide (LASIX) 40 MG tablet Take 1 tablet (40 mg total) by mouth daily. (Patient not taking: Reported on 05/14/2015) 30 tablet 0  . hydrALAZINE (APRESOLINE) 25 MG tablet Take 1 tablet (25 mg total) by mouth 3 (three) times daily. (Patient not taking: Reported on 05/14/2015) 90 tablet 0  . isosorbide mononitrate (IMDUR) 30 MG 24 hr tablet Take 1 tablet (30 mg total) by mouth daily. (Patient not taking: Reported on 05/14/2015) 30 tablet 0  . rosuvastatin (CRESTOR) 20 MG tablet Take 1 tablet (20 mg total) by mouth daily at 6 PM. (Patient not taking: Reported on 05/14/2015) 30 tablet 0  . spironolactone (ALDACTONE) 25 MG tablet Take 1 tablet (25 mg total) by mouth daily. (Patient not taking: Reported on 05/14/2015) 30 tablet 0   No current facility-administered medications for this encounter.    Filed Vitals:   05/14/15 1004  BP: 114/72  Pulse: 90  SpO2: 98%    PHYSICAL EXAM:  General:  Well appearing. No resp difficulty HEENT: normal Neck: supple. JVP 8-9. Carotids 2+ bilaterally; no bruits. No lymphadenopathy or thryomegaly appreciated. Cor: PMI normal. Regular rate & rhythm. No rubs, gallops or murmurs. Lungs: clear Abdomen: soft, nontender, nondistended. No hepatosplenomegaly. No bruits or masses. Good bowel sounds. Extremities: no cyanosis, clubbing, rash, LLE 2+ edema. R AKA  Neuro: alert & orientedx3, cranial nerves grossly intact. Moves all 4 extremities w/o difficulty. Affect pleasant.      ASSESSMENT & PLAN: 1. Chronic Systolic Heart Failure ICM- 03/2015 systoECHO EF 20% Mid ventricle and apex akinetic? Takotsubo.  Most recent admit to [facility] she had cardiogenic shock and was on short term milrinone. PICC left in place in the event she needed CO_OX . After extensive medication review she is not taking dig, hydralazine, imdur, sprio or lasix.  Contacted the SNF and the med sheet that was sent to the  visit is the one she is currently on. Again no spiro, imdur, hydralazine, dig, or lasix.  CO-OX today is stable. PICC can be discontinued at SNF.  Today she is NYHA II-III. Volume status mildly elevated. Stop HCTZ. Start lasix  40 mg daily.  Stop amlodipine. Cut back losartan to 25 mg daily  Continue carvedilol 3.125 mg twice a day. Add digoxin 0.125 mg daily Hold off on hydralazine/imdur for now.  Plan optimize HF meds for 3 months. Repeat ECHO at that  time.  If she is discharged to home she will need home health.  Check BMET and CO-OX. CO-OX stable 66%. Renal function was stable.  2. ICM per cath 7/26 - No chest pain. On BB, aspirin, statin 3. PAD  5. R AKA 6. DMII per SNF MD 7. TIA - on statin and aspirin   Follow up in 2 weeks. Plan to check dig level at that time. I have sent current list of HF medication list to SNF.  Leemon Ayala NP-C  2:34 PM

## 2015-05-16 ENCOUNTER — Non-Acute Institutional Stay (SKILLED_NURSING_FACILITY): Payer: Medicare Other | Admitting: Internal Medicine

## 2015-05-16 DIAGNOSIS — I10 Essential (primary) hypertension: Secondary | ICD-10-CM

## 2015-05-16 DIAGNOSIS — E1121 Type 2 diabetes mellitus with diabetic nephropathy: Secondary | ICD-10-CM | POA: Diagnosis not present

## 2015-05-16 DIAGNOSIS — N182 Chronic kidney disease, stage 2 (mild): Secondary | ICD-10-CM | POA: Diagnosis not present

## 2015-05-16 DIAGNOSIS — I255 Ischemic cardiomyopathy: Secondary | ICD-10-CM

## 2015-05-16 MED ORDER — INSULIN PEN NEEDLE 29G X 12MM MISC: 5.0000 [IU] | Freq: Three times a day (TID) | Status: DC

## 2015-05-16 MED ORDER — CARVEDILOL 3.125 MG PO TABS: 6.2500 mg | ORAL_TABLET | Freq: Two times a day (BID) | ORAL | Status: DC

## 2015-05-16 NOTE — Progress Notes (Deleted)
Patient ID: Jenna Brown, female   DOB: [birth year], 64 y.o.   MRN: [id number]

## 2015-05-19 ENCOUNTER — Non-Acute Institutional Stay (SKILLED_NURSING_FACILITY): Payer: Medicare Other | Admitting: Internal Medicine

## 2015-05-19 DIAGNOSIS — E1121 Type 2 diabetes mellitus with diabetic nephropathy: Secondary | ICD-10-CM

## 2015-05-19 DIAGNOSIS — I255 Ischemic cardiomyopathy: Secondary | ICD-10-CM | POA: Diagnosis not present

## 2015-05-19 DIAGNOSIS — N182 Chronic kidney disease, stage 2 (mild): Secondary | ICD-10-CM

## 2015-05-20 NOTE — Progress Notes (Addendum)
Patient ID: Jenna Brown, female   DOB: 1951-01-16, 64 y.o.   MRN: [id number]                PROGRESS NOTE  DATE:  05/16/2015       FACILITY: [facility]                LEVEL OF CARE:   SNF   Acute Visit               CHIEF COMPLAINT:  Follow up congestive heart failure, medication difficulties.    HISTORY OF PRESENT ILLNESS:  This is a patient who was admitted to hospital from 04/10/2015 through 04/21/2015.  She had congestive heart failure, non-ST elevation MI, and community-acquired pneumonia.  Her admission weight was 72.5 kg and discharged at 72 kg.      She has known coronary artery disease which is not felt, I think, to be amenable to revascularization.    She has a history of medication noncompliance, and her hemoglobin A1c was 11.3.    She seems to have come out of the hospital with two different versions of her medication list.  She was seen by our nurse practitioner before I saw her as I was on vacation.  On this list, she is on Norvasc, hydrochlorothiazide.  By the time I saw her, I quoted her being on Lasix 40 and Digoxin as well as Lantus insulin 45 U.  I did pick up on the fact that she was not on Lantus insulin, but did not pick up on the fact that she was not on Lasix or Digoxin.  I did note her tachycardia and put her on Coreg at 3.125 b.i.d.   As noted, I also started her on Lantus.     She has since been back to see Cardiology, who I gather noted the correct list of medications.   They stopped the hydrochlorothiazide and Norvasc, started her on Lasix 40 mg a day, Digoxin 0.125 q.d.  Losartan was decreased to 25 mg from 100 mg a day.    Past Medical History  Diagnosis Date  . Hyperlipidemia     takes Simvastatin daily  . Peripheral vascular disease   . Stroke     TIA history  . PONV (postoperative nausea and vomiting)   . Hypertension     takes Amlodipine/HCTZ/Losartan daily  . Vertigo     takes Ativert prn  . Hemorrhoids   . Anxiety     takes Xanax prn    . Myocardial infarction 1990's  . Type II diabetes mellitus     takes Glucovance and Januvia daily  . H/O hiatal hernia   . GERD (gastroesophageal reflux disease)   . Headache(784.0)     when b/p is elevated  . Migraines   . Arthritis    Past Surgical History  Procedure Laterality Date  . Cleft palate repair      several  . Pr vein bypass graft,aorto-fem-pop  05/27/11    Right SFA-Below knee Pop BP  . Tonsillectomy    . Dilation and curettage of uterus    . Aortogram  02/2012  . Colonoscopy    . Femoral-tibial bypass graft  03/29/2012    Procedure: BYPASS GRAFT FEMORAL-TIBIAL ARTERY;  Surgeon: [name], MD;  Location: [facility] OR;  Service: Vascular;  Laterality: Right;  Right femoral to Posterior Tibialis with composite graft of 89mm x 80 cm ringed gortex graft and saphenous vein  ,intraoperative arteriogram  . Femoral-popliteal  bypass graft  04/05/2012    Procedure: BYPASS GRAFT FEMORAL-POPLITEAL ARTERY;  Surgeon: [name], MD;  Location: [facility];  Service: Vascular;  Laterality: Right;  REVISION  . Myomectomy    . Tubal ligation  ~ 1990  . Femoropopliteal thrombectomy / embolectomy  05/09/2012  . Amputation  05/10/2012    Procedure: AMPUTATION ABOVE KNEE;  Surgeon: [name], MD;  Location: [facility] OR;  Service: Vascular;  Laterality: Right;   open right groin wound noted  . Abdominal aortagram N/A 02/29/2012    Procedure: ABDOMINAL Maxcine Ham;  Surgeon: [name], MD;  Location: [facility] CATH LAB;  Service: Cardiovascular;  Laterality: N/A;  . Cardiac catheterization N/A 04/13/2015    Procedure: Right/Left Heart Cath and Coronary Angiography;  Surgeon: [name], MD;  Location: [facility] [facility] CV LAB;  Service: Cardiovascular;  Laterality: N/A;    CURRENT MEDICATIONS:  In view of the confusion over this, her current medications are:    Coreg 3.125 b.i.d.     Lantus 10 U subcu q.h.s.     Humalog sliding scale.    Lasix 40 q.d.     Digoxin 0.125 daily.    Losartan 25  q.d.     ASA 81 q.d.     Metaglip 5/500, 2 p.o. b.i.d.      Ibuprofen p.r.n., which she does not appear to be taking.  However, that probably should be discontinued, as well.     Antivert 25, 1 p.o. q.d. p.r.n.     Vitamin C 500 q.d.     Zinc sulfate 220 q.d.            REVIEW OF SYSTEMS:    General: no distress HEENT:   The patient denies headache.   CHEST/RESPIRATORY:  No shortness of breath.   CARDIAC:  No chest pain.   EDEMA/VARICOSITIES:  Extremities:  She states the edema in her remaining leg is improving.   GI:  No nausea, vomiting, or abdominal pain.            GU:  Says she is voiding very frequently (I think this is Lasix).  She does not have dysuria.   ENDOCRINE:  CBGs yesterday were 163, 235, 212.  I cannot read the h.s. result.  So far today, she is 100 at breakfast and 158 a.c. lunch.  No relevant symptoms  PHYSICAL EXAMINATION:   VITAL SIGNS:     PULSE:  88.     RESPIRATIONS:  18, and unlabored.   BLOOD PRESSURE:  132/78.  02 SATURATIONS:  95% on room air.   GENERAL APPEARANCE:  The patient is not in any distress.             CHEST/RESPIRATORY:  Clear air entry bilaterally.    CARDIOVASCULAR:   CARDIAC:  Heart sounds are normal.  There are no murmurs.   No S3.  Her JVP is not elevated.   GASTROINTESTINAL:   LIVER/SPLEEN/KIDNEYS:  No liver, no spleen.  No tenderness.   CIRCULATION:   EDEMA/VARICOSITIES:  Extremities:  She has trivial edema on her remaining leg on the left.   SKIN:   INSPECTION:  She still has a PICC line which I think was initially to do a cardiac cath.  By review of Cardiology notes, that is not going to be done.  This can be discontinued   ASSESSMENT/PLAN:  Severe cardiomyopathy with a known ejection fraction of 20%.  This is presumably ischemic, although there was some question raised about Takotsubo.     Her blood pressure is stable.  Her weight has not gone down on the doses of Lasix at 40 mg as of yet,  although I am checking today's value.  She is still tachycardic with a pulse rate in the 80s and 90s.  I will increase her Coreg.  Her blood pressure should be able to handle this.    Type 2 diabetes.  She was apparently on a much higher dose of Lantus at night, and currently I have put her on 10 U on arrival as she was not on any, according to the records.  I think the Lantus at 10 U is satisfactory.  I am going to try to do away with the sliding scale and move to 5 U a.c. meals.    Stage 2 CRF secondary to type 2 dm. This is stable  Medication issues/transition of care issues  Once again, I still do not understand where exactly the confusion about the medications started.  However, we seem to be getting this under control.  She is not on Aldactone, Imdur, hydralazine.   Dig and Lasix were started by Cardiology, although I had them on my original medications.  My nurse practitioner did not.        CPT CODE: 21308     ADDENDUM:  Last basic metabolic panel showed a sodium of 138, potassium of 4.8, a CO2 of 23, BUN of 36, and a creatinine of 1.15.   This was on 05/08/2015.   I note that Cardiology rechecked this on 05/14/2015 at which time sodium was 142, potassium 4.2, CO2 of 24, BUN 25, and creatinine of 1.1.

## 2015-05-24 NOTE — Progress Notes (Signed)
Patient ID: Jenna Brown, female   DOB: Jun 28, 1951, 64 y.o.   MRN: [id number]                PROGRESS NOTE  DATE:  05/19/2015        FACILITY: [facility]                 LEVEL OF CARE:   SNF   Acute Visit/Discharge Visit   CHIEF COMPLAINT:  Pre-discharge review.    HISTORY OF PRESENT ILLNESS:  This is a patient whom I admitted at the beginning of the month after a stay at [facility] from 04/10/2015 through 04/21/2015.    She was admitted in multifactorial congestive heart failure including a non-ST elevation MI, pneumonia.  Her discharge weight was 72 kg.     She was also listed as being noncompliant with her medical regimen.    An echocardiogram showed an EF of 20%.  The mid ventricle and apex were akinetic.  She went on to have a cardiac catheterization and she was aggressively diuresed.    Her hemoglobin A1c was 11.3.    She came here on an insulin sliding scale and Metaglip 5/500, 2 tablets twice a day.  She was started on Lantus insulin 10 U subcu q.h.s. due to fasting blood sugars in the 190-200 range.  In lieu of the sliding scale, she is now on Humalog 5 U subcu a.c. meals.  With this, her blood sugars are really consistently less than 100.  I may be able to actually back off on the Metaglip, perhaps changing to metformin 500 b.i.d.     It has actually come up at this late hour that the patient is not really capable of giving her insulin and we are actually calling her sister to see if the patient's sister will give insulin.    With regards to her congestive heart failure/severe ischemic cardiomyopathy, there were very significant medication issues which were eventually resolved with the help of Dr. Clayborne Dana staff.  She is now on Lasix 40 mg.  Her weight had initially gone from 154 pounds on her arrival here to 164 pounds on 05/15/2015.  However, her weight has gone down five pounds since starting on the Lasix 40 mg.  The patient notes that her edema is much improved.      CURRENT MEDICATIONS:  Medication list is reviewed.              Lasix 40 q.d.    Digoxin 0.125 q.d.      Losartan 25 daily.      Humalog 5 U a.c. meals.    Lantus 10 U at h.s.      ASA 81 q.d.     Metaglip 5/500, 2 p.o. b.i.d.      Antivert p.r.n.        Vitamin C 500 daily.     Zinc sulfate 220 daily.     Coreg 6.25 p.o. b.i.d.     REVIEW OF SYSTEMS:    HEENT:   No headache.     CHEST/RESPIRATORY:  No cough.  No sputum.    CARDIAC:  No chest pain or palpitations.   GI:  No nausea, vomiting, or diarrhea.       GU:  No dysuria.    EDEMA/VARICOSITIES:  Extremities:  She states her edema is much better in her remaining leg.    PHYSICAL EXAMINATION:   VITAL SIGNS:     TEMPERATURE:  97.4.   PULSE:  70.     RESPIRATIONS:  20.    BLOOD PRESSURE:  132/70.    02 SATURATIONS:  95% on room air.   WEIGHT:  159 pounds, which is 72 kg which was precisely what she was when she left the hospital, although she has lost five pounds in the last four days.   CHEST/RESPIRATORY:  Exam is clear.      CARDIOVASCULAR:   CARDIAC:  Heart sounds are normal.  There is no S3.  JVP is not elevated.   EDEMA/VARICOSITIES:  She has no coccyx edema and no lower extremity edema.      GASTROINTESTINAL:   ABDOMEN:  Nontender.     LIVER/SPLEEN/KIDNEYS:  No liver, no spleen.    ASSESSMENT/PLAN:                     Congestive heart failure.  In spite of the weight variations here, I think there were some medication errors.  She was only recently started on the Lasix 40 mg and her weight has come down five pounds.  I do not see a major issue here.  Her last basic metabolic panel showed a sodium of 138, potassium of 4.8, BUN at 36, creatinine at 1.15.  However, this was on 05/08/2015.  I will recheck this tomorrow.    Type 2 diabetes.  On insulin.   Apparently, the patient was not on insulin before she came to the hospital.  She was on it previously when she was in this building.  Currently, she is on  Lantus 10 U, Humalog 5 U a.c. meals, and Metaglip 5/500, 2 p.o. b.i.d.  I think this is giving excessive control.   I think the Metaglip can be changed to pure metformin at 500 b.i.d.  We are currently in the process of seeing whether there is anybody available to do insulin.   I would not trust the patient in this regard.    Hypertension.  This seems to be well controlled.    Hyperlipidemia.  On Crestor.    Greater than 30 minutes spent in discussion with the social worker repetitively and the patient.    She has a wheelchair at home.    As I understand things, she will be going to her own home with her sister and full-time attendants, eventually transitioning to live with her mother in [place].     CPT CODE: 91478 (greater than 30 minutes spent)

## 2015-05-26 ENCOUNTER — Non-Acute Institutional Stay (SKILLED_NURSING_FACILITY): Payer: Medicare Other | Admitting: Internal Medicine

## 2015-05-26 DIAGNOSIS — I255 Ischemic cardiomyopathy: Secondary | ICD-10-CM

## 2015-05-26 DIAGNOSIS — E1121 Type 2 diabetes mellitus with diabetic nephropathy: Secondary | ICD-10-CM

## 2015-05-28 ENCOUNTER — Encounter (HOSPITAL_COMMUNITY): Payer: Self-pay

## 2015-05-28 ENCOUNTER — Ambulatory Visit (HOSPITAL_COMMUNITY)
Admission: RE | Admit: 2015-05-28 | Discharge: 2015-05-28 | Disposition: A | Discharge status: Home / Self Care | Payer: Medicare Other | Source: Ambulatory Visit | Attending: Internal Medicine | Admitting: Internal Medicine

## 2015-05-28 VITALS — BP 136/80 | HR 92 | Wt 159.0 lb

## 2015-05-28 DIAGNOSIS — Z89611 Acquired absence of right leg above knee: Secondary | ICD-10-CM | POA: Diagnosis not present

## 2015-05-28 DIAGNOSIS — I10 Essential (primary) hypertension: Secondary | ICD-10-CM | POA: Diagnosis not present

## 2015-05-28 DIAGNOSIS — E119 Type 2 diabetes mellitus without complications: Secondary | ICD-10-CM | POA: Diagnosis not present

## 2015-05-28 DIAGNOSIS — Z7982 Long term (current) use of aspirin: Secondary | ICD-10-CM | POA: Insufficient documentation

## 2015-05-28 DIAGNOSIS — F419 Anxiety disorder, unspecified: Secondary | ICD-10-CM | POA: Insufficient documentation

## 2015-05-28 DIAGNOSIS — I739 Peripheral vascular disease, unspecified: Secondary | ICD-10-CM | POA: Diagnosis not present

## 2015-05-28 DIAGNOSIS — G458 Other transient cerebral ischemic attacks and related syndromes: Secondary | ICD-10-CM | POA: Diagnosis not present

## 2015-05-28 DIAGNOSIS — Z833 Family history of diabetes mellitus: Secondary | ICD-10-CM | POA: Diagnosis not present

## 2015-05-28 DIAGNOSIS — Z794 Long term (current) use of insulin: Secondary | ICD-10-CM | POA: Insufficient documentation

## 2015-05-28 DIAGNOSIS — K219 Gastro-esophageal reflux disease without esophagitis: Secondary | ICD-10-CM | POA: Insufficient documentation

## 2015-05-28 DIAGNOSIS — I5022 Chronic systolic (congestive) heart failure: Secondary | ICD-10-CM | POA: Diagnosis not present

## 2015-05-28 DIAGNOSIS — I2510 Atherosclerotic heart disease of native coronary artery without angina pectoris: Secondary | ICD-10-CM | POA: Diagnosis not present

## 2015-05-28 DIAGNOSIS — I252 Old myocardial infarction: Secondary | ICD-10-CM | POA: Insufficient documentation

## 2015-05-28 DIAGNOSIS — Z8673 Personal history of transient ischemic attack (TIA), and cerebral infarction without residual deficits: Secondary | ICD-10-CM | POA: Insufficient documentation

## 2015-05-28 DIAGNOSIS — Z87891 Personal history of nicotine dependence: Secondary | ICD-10-CM | POA: Diagnosis not present

## 2015-05-28 DIAGNOSIS — E785 Hyperlipidemia, unspecified: Secondary | ICD-10-CM | POA: Insufficient documentation

## 2015-05-28 DIAGNOSIS — Z79899 Other long term (current) drug therapy: Secondary | ICD-10-CM | POA: Insufficient documentation

## 2015-05-28 DIAGNOSIS — I255 Ischemic cardiomyopathy: Secondary | ICD-10-CM | POA: Insufficient documentation

## 2015-05-28 MED ORDER — LOSARTAN POTASSIUM 50 MG PO TABS: 50.0000 mg | ORAL_TABLET | Freq: Every day | ORAL | Status: DC

## 2015-05-28 NOTE — Progress Notes (Signed)
Patient ID: RHYLEIGH DEBARI, female   DOB: 1951-07-26, 64 y.o.   MRN: [id number] PCP: Primary HF Cardiologist: Dr Haroldine Laws  HPI: Ms 64 year old female with hyperlipidemia, CVA, hypertension, type 2 diabetes mellitus, CAD, right AKA .   Admitted to [facility] 04/10/15 with increased dyspnea. Had RHC/LHC as noted below. Not candidate for revascularization per Dr Darcey Nora.  Required short term milrinone for cardiogenic shock. As she improved and fully diuresed, milrinone stopped. She had recommendations for lasix, spiro, losartan, hydralazine, and imdur.  Discharge weight was 158 pounds.     She returns for follow up. Last visit a significant time was spent on medication reconciliation. Overall feeling ok. Denies SOB/PND/Orthopnea.  Having headaches that she thinks is from diabetes medications. Working with therapy. Wants to go home and live with her sisters. Currently at [facility] trying to get better control of diabetes.    RHC/LHC 04/14/15  Prox LAD to Mid LAD lesion, 90% stenosed.  Ost Cx to Mid Cx lesion, 95% stenosed.  Ost 2nd Mrg to 2nd Mrg lesion, 80% stenosed.  Mid Cx lesion, 90% stenosed.  Prox RCA lesion, 60% stenosed.  Mid RCA lesion, 50% stenosed.       Labs 04/22/2015 K 4.5 Creatinine 1.24  Labs 05/14/2015; K 4.3     ROS: All systems negative except as listed in HPI, PMH and Problem List.  SH:  Social History   Social History  . Marital Status: Single    Spouse Name: N/A  . Number of Children: N/A  . Years of Education: N/A   Occupational History  . Not on file.   Social History Main Topics  . Smoking status: Former Smoker -- 0.25 packs/day for 40 years    Types: Cigarettes    Quit date: 02/27/2012  . Smokeless tobacco: Never Used  . Alcohol Use: No  . Drug Use: No  . Sexual Activity: No   Other Topics Concern  . Not on file   Social History Narrative    FH:  Family History  Problem Relation Age of Onset  . Cancer Mother     breast  . Diabetes Father      Past Medical History  Diagnosis Date  . Hyperlipidemia     takes Simvastatin daily  . Peripheral vascular disease   . Stroke     TIA history  . PONV (postoperative nausea and vomiting)   . Hypertension     takes Amlodipine/HCTZ/Losartan daily  . Vertigo     takes Ativert prn  . Hemorrhoids   . Anxiety     takes Xanax prn  . Myocardial infarction 1990's  . Type II diabetes mellitus     takes Glucovance and Januvia daily  . H/O hiatal hernia   . GERD (gastroesophageal reflux disease)   . Headache(784.0)     when b/p is elevated  . Migraines   . Arthritis     Current Outpatient Prescriptions  Medication Sig Dispense Refill  . acetaminophen (TYLENOL) 650 MG CR tablet Take 650 mg by mouth every 8 (eight) hours as needed for pain.    Marland Kitchen aspirin 81 MG chewable tablet Chew 81 mg by mouth daily.    . carvedilol (COREG) 3.125 MG tablet Take 2 tablets (6.25 mg total) by mouth 2 (two) times daily with a meal. 60 tablet prn  . digoxin (LANOXIN) 0.125 MG tablet Take 1 tablet (0.125 mg total) by mouth daily. 90 tablet 3  . furosemide (LASIX) 40 MG tablet Take 1 tablet (40  mg total) by mouth daily. 90 tablet 3  . insulin aspart (NOVOLOG FLEXPEN) 100 UNIT/ML FlexPen Inject 22 Units into the skin 3 (three) times daily with meals. 15 mL 11  . insulin glargine (LANTUS) 100 UNIT/ML injection Inject 15 Units into the skin at bedtime.    . Insulin Pen Needle (AURORA PEN NEEDLES) 29G X 12MM MISC 5 Units by Does not apply route 3 (three) times daily before meals. 100 each 0  . losartan (COZAAR) 25 MG tablet Take 1 tablet (25 mg total) by mouth daily. 90 tablet 3  . metFORMIN (GLUCOPHAGE) 500 MG tablet Take 500 mg by mouth 2 (two) times daily with a meal.    . rosuvastatin (CRESTOR) 20 MG tablet Take 20 mg by mouth daily.    Marland Kitchen ALPRAZolam (XANAX) 0.25 MG tablet Take 0.25 mg by mouth 3 (three) times daily as needed for anxiety.     . meclizine (ANTIVERT) 25 MG tablet Take 25 mg by mouth daily as  needed for dizziness.      No current facility-administered medications for this encounter.    Filed Vitals:   05/28/15 0959  BP: 136/80  Pulse: 92  Weight: 159 lb (72.122 kg)  SpO2: 97%    PHYSICAL EXAM:  General:  Well appearing. No resp difficulty. Arrived in a wheelchair.  HEENT: normal Neck: supple. JVP flat. Carotids 2+ bilaterally; no bruits. No lymphadenopathy or thryomegaly appreciated. Cor: PMI normal. Regular rate & rhythm. No rubs, gallops or murmurs. Lungs: clear Abdomen: soft, nontender, nondistended. No hepatosplenomegaly. No bruits or masses. Good bowel sounds. Extremities: no cyanosis, clubbing, rash, LLE no edema. R AKA  Neuro: alert & orientedx3, cranial nerves grossly intact. Moves all 4 extremities w/o difficulty. Affect pleasant.      ASSESSMENT & PLAN: 1. Chronic Systolic Heart Failure ICM- 03/2015 systoECHO EF 20% Mid ventricle and apex akinetic? Takotsubo.  Today she is NYHA II-III. Volume status stable. Continue  lasix  40 mg daily.   Increase losartan to 50 mg daily. Check BMET next week.  Continue carvedilol 6.25 mg twice a day.  Continue digoxin 0.125 mg daily. Check Dig level next week.  Hold off on hydralazine/imdur for now as she is having headaches.  Plan optimize HF meds for 3 months. Repeat ECHO at that time.  If she is discharged to home from SNF -->she will need home health.  Check BMET and CO-OX. CO-OX stable 66%. Renal function was stable.  2. ICM per cath 7/26 - No chest pain. On BB, aspirin, statin 3. PAD  4. R AKA 5. DMII per SNF MD. Recently started on insulin.  6. TIA - on statin and aspirin   Check dig and BMET in SNF next week. Follow up in 3 weeks.    Rabecca Birge NP-C  10:42 AM

## 2015-05-28 NOTE — Progress Notes (Signed)
Advanced Heart Failure Medication Review by a Pharmacist  Does the patient  feel that his/her medications are working for him/her?  yes  Has the patient been experiencing any side effects to the medications prescribed?  no  Does the patient measure his/her own blood pressure or blood glucose at home?  no   Does the patient have any problems obtaining medications due to transportation or finances?   no  Understanding of regimen: fair Understanding of indications: fair Potential of compliance: good    Pharmacist comments:  [name] is a pleasant 64 yo F presenting with her MAR from [facility]. There are a few discrepancies on the list including having both Crestor and Lipitor on the list. I called [facility] and am awaiting on a call back from her nurse.   Jenna Brown. Velva [name], PharmD, BCPS, CPP Clinical Pharmacist Pager: [phone] Phone: [phone] 05/28/2015 10:29 AM

## 2015-05-28 NOTE — Progress Notes (Addendum)
Patient ID: Jenna Brown, female   DOB: [birth year], 64 y.o.   MRN: [id number]                PROGRESS NOTE  DATE:  05/26/2015         FACILITY: [facility]                     LEVEL OF CARE:   SNF   Acute Visit              CHIEF COMPLAINT:  Follow up diabetes, heart failure.      HISTORY OF PRESENT ILLNESS:  I had last seen this patient on 05/19/2015, making plans for her to go home.  Apparently, nobody knew how to give insulin and the patient is apparently learning how to do this.    She also was supposed to have a basic metabolic panel in follow-up and I do not see this.    With regards to her diabetes, she is on Lantus 10 U subcu q.h.s., as well as Humalog 5 U a.c. meals which I changed from her sliding scale.   Also, the Metaglip 2 tablets twice a day, I changed to metformin 500 once a day in response to a larger dose of insulin.  Currently, her blood sugars are well above 200 at all times of the day.    With regards to her cardiac status, she has an EF of 20%.  She has not had any chest pain.    Since starting on Lasix 40 mg, her weight has been stable at 159 pounds, down five pounds from before the Lasix.     Past Medical History  Diagnosis Date  . Hyperlipidemia     takes Simvastatin daily  . Peripheral vascular disease   . Stroke     TIA history  . PONV (postoperative nausea and vomiting)   . Hypertension       . Vertigo     takes Ativert prn  . Hemorrhoids   . Anxiety     takes Xanax prn  . Myocardial infarction 1990's  . Type II diabetes mellitus       . H/O hiatal hernia   . GERD (gastroesophageal reflux disease)   . Headache(784.0)     when b/p is elevated  . Migraines   . Arthritis     REVIEW OF SYSTEMS:    CHEST/RESPIRATORY:  No shortness of breath.  No cough.   CARDIAC:  No chest pain.   GI:  No abdominal pain.   No diarrhea.    GU:  No dysuria.    ENDOCRINE:  Blood sugars consistently above 200.     MSK: no joint pain  PHYSICAL  EXAMINATION:   VITAL SIGNS:     PULSE:  82.     RESPIRATIONS:  18.      02 SATURATIONS:  95% on room air.    GENERAL APPEARANCE:   The patient is not in any distress.     CHEST/RESPIRATORY:  Clear air entry bilaterally.    CARDIOVASCULAR:   CARDIAC:  Heart sounds are normal.  There are no murmurs.    GASTROINTESTINAL:   ABDOMEN:  Soft, nontender.  No masses.    CIRCULATION:   EDEMA/VARICOSITIES:  Extremities:  Absolutely no edema and no coccyx edema.    ASSESSMENT/PLAN:                       Congestive heart failure.  I see no evidence of this.  There was supposed to be a follow-up basic metabolic panel and I will make sure this is done.   I think her heart failure is compensated.    Type 2 diabetes.  I have increased her Lantus from 10 U to 15 U, and continuing the Humalog 5 U a.c. meals, as well as the metformin.      Hypertension.  This is well controlled.     CPT CODE: 28413

## 2015-05-28 NOTE — Patient Instructions (Signed)
INCREASE Losartan to 50mg  once daily.  STOP Lipitor.  Crestor 20mg  once daily.  Please draw BMET & Dig Level next week.  Fax results to [phone]  Follow up 3 weeks.  Do the following things EVERYDAY: 1) Weigh yourself in the morning before breakfast. Write it down and keep it in a log. 2) Take your medicines as prescribed 3) Eat low salt foods-Limit salt (sodium) to 2000 mg per day.  4) Stay as active as you can everyday 5) Limit all fluids for the day to less than 2 liters

## 2015-06-02 ENCOUNTER — Non-Acute Institutional Stay (SKILLED_NURSING_FACILITY): Payer: Medicare Other | Admitting: Internal Medicine

## 2015-06-02 DIAGNOSIS — E0851 Diabetes mellitus due to underlying condition with diabetic peripheral angiopathy without gangrene: Secondary | ICD-10-CM

## 2015-06-02 DIAGNOSIS — I255 Ischemic cardiomyopathy: Secondary | ICD-10-CM

## 2015-06-02 DIAGNOSIS — E1121 Type 2 diabetes mellitus with diabetic nephropathy: Secondary | ICD-10-CM

## 2015-06-07 NOTE — Progress Notes (Signed)
Patient ID: Jenna Brown, female   DOB: [birth year], 64 y.o.   MRN: [id number]                PROGRESS NOTE  DATE:  06/02/2015               FACILITY: [facility]                      LEVEL OF CARE:   SNF   Acute Visit                     CHIEF COMPLAINT:  Follow up diabetes, heart failure.     HISTORY OF PRESENT ILLNESS:  This is a patient who was making plans to go home when we discovered that nobody knew how to give her her insulin, and apparently we are making headway with this now.     She also has a severe ischemic cardiomyopathy with an ejection fraction of 20%.  She has been started on Lasix 40 mg.  Her most recent weight was 160 pounds.   This is actually up three pounds since 06/01/2015, although it is not clearly a trend.     LABORATORY DATA:   Basic metabolic panel:    Sodium 141, potassium 4.5, CO2 of 26, BUN 31, creatinine 1.24.  Looking at the pattern of her creatinine over the last six weeks, there does not seem to be any major change here.  Her creatinine on 05/20/2015 was 0.99 (however, probably not receiving Lasix at that time).      Current Outpatient Prescriptions on File Prior to Visit  Medication Sig Dispense Refill  . acetaminophen (TYLENOL) 650 MG CR tablet Take 650 mg by mouth every 8 (eight) hours as needed for pain.    Marland Kitchen ALPRAZolam (XANAX) 0.25 MG tablet Take 0.25 mg by mouth 3 (three) times daily as needed for anxiety.     Marland Kitchen aspirin 81 MG chewable tablet Chew 81 mg by mouth daily.    . carvedilol (COREG) 3.125 MG tablet Take 2 tablets (6.25 mg total) by mouth 2 (two) times daily with a meal. 60 tablet prn  . digoxin (LANOXIN) 0.125 MG tablet Take 1 tablet (0.125 mg total) by mouth daily. 90 tablet 3  . furosemide (LASIX) 40 MG tablet Take 1 tablet (40 mg total) by mouth daily. 90 tablet 3  . insulin aspart (NOVOLOG FLEXPEN) 100 UNIT/ML FlexPen Inject 22 Units into the skin 3 (three) times daily with meals. 15 mL 11  . insulin glargine (LANTUS) 100  UNIT/ML injection Inject 15 Units into the skin at bedtime.    . Insulin Pen Needle (AURORA PEN NEEDLES) 29G X 12MM MISC 5 Units by Does not apply route 3 (three) times daily before meals. 100 each 0  . losartan (COZAAR) 50 MG tablet Take 1 tablet (50 mg total) by mouth daily.    . meclizine (ANTIVERT) 25 MG tablet Take 25 mg by mouth daily as needed for dizziness.     . metFORMIN (GLUCOPHAGE) 500 MG tablet Take 500 mg by mouth 2 (two) times daily with a meal.    . rosuvastatin (CRESTOR) 20 MG tablet Take 20 mg by mouth daily.      Past Medical History  Diagnosis Date  . Hyperlipidemia     takes Simvastatin daily  . Peripheral vascular disease   . Stroke     TIA history  . PONV (postoperative nausea and vomiting)   . Hypertension  takes Amlodipine/HCTZ/Losartan daily  . Vertigo     takes Ativert prn  . Hemorrhoids   . Anxiety     takes Xanax prn  . Myocardial infarction 1990's  . Type II diabetes mellitus     takes Glucovance and Januvia daily  . H/O hiatal hernia   . GERD (gastroesophageal reflux disease)   . Headache(784.0)     when b/p is elevated  . Migraines   . Arthritis     REVIEW OF SYSTEMS:  General: Feels well   CARDIAC:  She is describing a "fluttering sensation".  This is not exertional, not associated with shortness of breath, nausea, vomiting, or diaphoresis.  She states that this can go on for an hour and she simply lies still, hoping it will go away.    GI:  No abdominal pain.    GU:  No dysuria.    ENDOCRINE:   Blood sugars are still consistently above 200.  Yesterday:  275/269/314.   MUSCULOSKELETAL:  No joint pain.      CNS: no focal complaints  PHYSICAL EXAMINATION:   VITAL SIGNS:     PULSE:  69.    RESPIRATIONS:  18.     BLOOD PRESSURE:  134/71.   02 SATURATIONS:  95%.     GENERAL APPEARANCE:  The patient is not in any distress.     CHEST/RESPIRATORY:  Clear air entry bilaterally.    CARDIOVASCULAR:   CARDIAC:  Heart sounds are regular.   There is no S3.  There are no murmurs.   Her JVP is not elevated.  She does not have coccyx edema.     GASTROINTESTINAL:   ABDOMEN:  Soft, nontender.     LIVER/SPLEEN/KIDNEYS:  No liver, no spleen.   GENITOURINARY:   BLADDER:  No bladder distention.   CIRCULATION:   EDEMA/VARICOSITIES:  Extremities:  She has mild edema of her remaining left leg.    ASSESSMENT/PLAN:                    Ischemic cardiomyopathy.  In spite of the recent weight gain, I am not sure that this is unstable.  I am going to have them update me with the weights towards the end of the week.    Type 2 diabetes.  On insulin.  I think the Humalog at 5 U is holding her blood sugars stable through the day, although we simply do not have enough long-acting insulin and I am going to increase her to 25 U at h.s.      "Fluttering sensation" in her chest.   Her heart rate is controlled with Digoxin at 0.125 q.d. and Coreg 6.25.   She will need a Dig level.  There was enough irregularity with her pulse rate.   I will check an EKG on her to make sure this is sinus rhythm.     Hypertension.  Noted increase in Cozaar by Cardiology.

## 2015-06-18 ENCOUNTER — Ambulatory Visit (HOSPITAL_COMMUNITY)
Admission: RE | Admit: 2015-06-18 | Discharge: 2015-06-18 | Disposition: A | Discharge status: Home / Self Care | Payer: Medicare Other | Source: Ambulatory Visit | Attending: Cardiology | Admitting: Cardiology

## 2015-06-18 VITALS — BP 116/62 | HR 79 | Wt 151.0 lb

## 2015-06-18 DIAGNOSIS — I739 Peripheral vascular disease, unspecified: Secondary | ICD-10-CM | POA: Diagnosis not present

## 2015-06-18 DIAGNOSIS — Z87891 Personal history of nicotine dependence: Secondary | ICD-10-CM | POA: Insufficient documentation

## 2015-06-18 DIAGNOSIS — I255 Ischemic cardiomyopathy: Secondary | ICD-10-CM | POA: Insufficient documentation

## 2015-06-18 DIAGNOSIS — G459 Transient cerebral ischemic attack, unspecified: Secondary | ICD-10-CM

## 2015-06-18 DIAGNOSIS — E119 Type 2 diabetes mellitus without complications: Secondary | ICD-10-CM | POA: Insufficient documentation

## 2015-06-18 DIAGNOSIS — Z7982 Long term (current) use of aspirin: Secondary | ICD-10-CM | POA: Insufficient documentation

## 2015-06-18 DIAGNOSIS — K219 Gastro-esophageal reflux disease without esophagitis: Secondary | ICD-10-CM | POA: Diagnosis not present

## 2015-06-18 DIAGNOSIS — Z8673 Personal history of transient ischemic attack (TIA), and cerebral infarction without residual deficits: Secondary | ICD-10-CM | POA: Insufficient documentation

## 2015-06-18 DIAGNOSIS — I5022 Chronic systolic (congestive) heart failure: Secondary | ICD-10-CM | POA: Insufficient documentation

## 2015-06-18 DIAGNOSIS — I10 Essential (primary) hypertension: Secondary | ICD-10-CM | POA: Insufficient documentation

## 2015-06-18 DIAGNOSIS — E785 Hyperlipidemia, unspecified: Secondary | ICD-10-CM | POA: Diagnosis not present

## 2015-06-18 DIAGNOSIS — E1165 Type 2 diabetes mellitus with hyperglycemia: Secondary | ICD-10-CM

## 2015-06-18 DIAGNOSIS — Z833 Family history of diabetes mellitus: Secondary | ICD-10-CM | POA: Insufficient documentation

## 2015-06-18 DIAGNOSIS — Z89611 Acquired absence of right leg above knee: Secondary | ICD-10-CM | POA: Insufficient documentation

## 2015-06-18 DIAGNOSIS — Z79899 Other long term (current) drug therapy: Secondary | ICD-10-CM | POA: Diagnosis not present

## 2015-06-18 DIAGNOSIS — F419 Anxiety disorder, unspecified: Secondary | ICD-10-CM | POA: Diagnosis not present

## 2015-06-18 DIAGNOSIS — I252 Old myocardial infarction: Secondary | ICD-10-CM | POA: Insufficient documentation

## 2015-06-18 DIAGNOSIS — Z794 Long term (current) use of insulin: Secondary | ICD-10-CM | POA: Insufficient documentation

## 2015-06-18 DIAGNOSIS — IMO0002 Reserved for concepts with insufficient information to code with codable children: Secondary | ICD-10-CM

## 2015-06-18 MED ORDER — CARVEDILOL 3.125 MG PO TABS: 9.3750 mg | ORAL_TABLET | Freq: Two times a day (BID) | ORAL | Status: DC

## 2015-06-18 NOTE — Patient Instructions (Signed)
INCREASE Carvedilol to 9.375mg  (3 tablets)  FOLLOW UP 4 weeks w/ECHO (Dr.Bensimhon)

## 2015-06-18 NOTE — Progress Notes (Signed)
Patient ID: GELISA GIRONDA, female   DOB: 1951-06-23, 64 y.o.   MRN: [id number] Patient ID: MATHILDA DEFLORIO, female   DOB: July 20, 1951, 64 y.o.   MRN: [id number] PCP: Primary HF Cardiologist: Dr Haroldine Laws  HPI: Ms 64 year old female with hyperlipidemia, CVA, hypertension, type 2 diabetes mellitus, CAD, right AKA .   Admitted to [facility] 04/10/15 with increased dyspnea. Had RHC/LHC as noted below. Not candidate for revascularization per Dr Darcey Nora.  Required short term milrinone for cardiogenic shock. As she improved and fully diuresed, milrinone stopped. She had recommendations for lasix, spiro, losartan, hydralazine, and imdur.  Discharge weight was 158 pounds.     She returns for follow up. Last visit losartan was increased to 50 mg daily. Overall feeling ok. Denies SOB/PND/Orthopnea. Denies CP.  Completed therapy. Wants to go home and live with her sisters. Currently at [facility] trying to get better control of diabetes.    RHC/LHC 04/14/15  Prox LAD to Mid LAD lesion, 90% stenosed.  Ost Cx to Mid Cx lesion, 95% stenosed.  Ost 2nd Mrg to 2nd Mrg lesion, 80% stenosed.  Mid Cx lesion, 90% stenosed.  Prox RCA lesion, 60% stenosed.  Mid RCA lesion, 50% stenosed.       Labs 04/22/2015 K 4.5 Creatinine 1.24  Labs 05/14/2015; K 4.3     ROS: All systems negative except as listed in HPI, PMH and Problem List.  SH:  Social History   Social History  . Marital Status: Single    Spouse Name: N/A  . Number of Children: N/A  . Years of Education: N/A   Occupational History  . Not on file.   Social History Main Topics  . Smoking status: Former Smoker -- 0.25 packs/day for 40 years    Types: Cigarettes    Quit date: 02/27/2012  . Smokeless tobacco: Never Used  . Alcohol Use: No  . Drug Use: No  . Sexual Activity: No   Other Topics Concern  . Not on file   Social History Narrative    FH:  Family History  Problem Relation Age of Onset  . Cancer Mother     breast  . Diabetes  Father     Past Medical History  Diagnosis Date  . Hyperlipidemia     takes Simvastatin daily  . Peripheral vascular disease   . Stroke     TIA history  . PONV (postoperative nausea and vomiting)   . Hypertension     takes Amlodipine/HCTZ/Losartan daily  . Vertigo     takes Ativert prn  . Hemorrhoids   . Anxiety     takes Xanax prn  . Myocardial infarction 1990's  . Type II diabetes mellitus     takes Glucovance and Januvia daily  . H/O hiatal hernia   . GERD (gastroesophageal reflux disease)   . Headache(784.0)     when b/p is elevated  . Migraines   . Arthritis     Current Outpatient Prescriptions  Medication Sig Dispense Refill  . acetaminophen (TYLENOL) 650 MG CR tablet Take 650 mg by mouth every 8 (eight) hours as needed for pain.    Marland Kitchen ALPRAZolam (XANAX) 0.25 MG tablet Take 0.25 mg by mouth 3 (three) times daily as needed for anxiety.     Marland Kitchen aspirin 81 MG chewable tablet Chew 81 mg by mouth daily.    . carvedilol (COREG) 3.125 MG tablet Take 2 tablets (6.25 mg total) by mouth 2 (two) times daily with a meal. 60 tablet prn  .  digoxin (LANOXIN) 0.125 MG tablet Take 1 tablet (0.125 mg total) by mouth daily. 90 tablet 3  . furosemide (LASIX) 40 MG tablet Take 1 tablet (40 mg total) by mouth daily. 90 tablet 3  . insulin aspart (NOVOLOG FLEXPEN) 100 UNIT/ML FlexPen Inject 22 Units into the skin 3 (three) times daily with meals. 15 mL 11  . insulin glargine (LANTUS) 100 UNIT/ML injection Inject 15 Units into the skin at bedtime.    . Insulin Pen Needle (AURORA PEN NEEDLES) 29G X 12MM MISC 5 Units by Does not apply route 3 (three) times daily before meals. 100 each 0  . losartan (COZAAR) 50 MG tablet Take 1 tablet (50 mg total) by mouth daily.    . meclizine (ANTIVERT) 25 MG tablet Take 25 mg by mouth daily as needed for dizziness.     . rosuvastatin (CRESTOR) 20 MG tablet Take 20 mg by mouth daily.     No current facility-administered medications for this encounter.     Filed Vitals:   06/18/15 1108  BP: 116/62  Pulse: 79  Weight: 151 lb (68.493 kg)  SpO2: 95%    PHYSICAL EXAM:  General:  Well appearing. No resp difficulty. Arrived in a wheelchair.  HEENT: normal Neck: supple. JVP flat. Carotids 2+ bilaterally; no bruits. No lymphadenopathy or thryomegaly appreciated. Cor: PMI normal. Regular rate & rhythm. No rubs, gallops or murmurs. Lungs: clear Abdomen: soft, nontender, nondistended. No hepatosplenomegaly. No bruits or masses. Good bowel sounds. Extremities: no cyanosis, clubbing, rash, LLE no edema. R AKA  Neuro: alert & orientedx3, cranial nerves grossly intact. Moves all 4 extremities w/o difficulty. Affect pleasant.    ASSESSMENT & PLAN: 1. Chronic Systolic Heart Failure  On cath 04/14/15 ICM- 03/2015 ECHO EF 20%  Mid ventricle and apex akinetic? Takotsubo.  Today she is NYHA II. Volume status stable. Continue  lasix  40 mg daily.   -Continue losartan  50 mg daily. - Increase carvedilol to 9.375 mg twice a day.   Continue digoxin 0.125 mg daily.  Hold off on hydralazine/imdur for now.   Refuses lab work today. I have asked the SNF to fax results for BMET and dig level.  Will not add spiro or increase losartan today without lab work to assess renal function.  If she is discharged to home from SNF -->she will need home health.  2. ICM per cath 7/26 - No chest pain. On BB, aspirin, statin 3. PAD  4. R AKA 5. DMII per SNF MD. Recently started on insulin.  6. TIA - on statin and aspirin   Follow up in 4 weeks with an ECHO.     CLEGG,AMY NP-C  11:18 AM

## 2015-06-18 NOTE — Progress Notes (Signed)
Advanced Heart Failure Medication Review by a Pharmacist  Does the patient  feel that his/her medications are working for him/her?  yes  Has the patient been experiencing any side effects to the medications prescribed?  no  Does the patient measure his/her own blood pressure or blood glucose at home?  yes   Does the patient have any problems obtaining medications due to transportation or finances?   no  Understanding of regimen: good Understanding of indications: good Potential of compliance: good    Pharmacist comments:  Ms. Haye is a pleasant 64 yo F presenting with a MAR from ALF. All of her medications were reconciled and she had no specific medication-related questions or concerns today.   Ruta Hinds. Velva [name], PharmD, BCPS, CPP Clinical Pharmacist Pager: [phone] Phone: [phone] 06/18/2015 11:32 AM

## 2015-06-23 ENCOUNTER — Non-Acute Institutional Stay (SKILLED_NURSING_FACILITY): Payer: Medicare Other | Admitting: Internal Medicine

## 2015-06-23 DIAGNOSIS — E1121 Type 2 diabetes mellitus with diabetic nephropathy: Secondary | ICD-10-CM | POA: Diagnosis not present

## 2015-06-23 DIAGNOSIS — Z794 Long term (current) use of insulin: Secondary | ICD-10-CM

## 2015-06-23 DIAGNOSIS — I255 Ischemic cardiomyopathy: Secondary | ICD-10-CM | POA: Diagnosis not present

## 2015-06-25 ENCOUNTER — Other Ambulatory Visit (HOSPITAL_COMMUNITY): Payer: Self-pay | Admitting: Internal Medicine

## 2015-06-26 NOTE — Progress Notes (Signed)
Patient ID: Jenna Brown, female   DOB: 1950-10-02, 64 y.o.   MRN: [id number]                PROGRESS NOTE  DATE:  06/23/2015            FACILITY: [facility]                     LEVEL OF CARE:   SNF   Acute Visit                 CHIEF COMPLAINT:  Hyperglycemia.         HISTORY OF PRESENT ILLNESS:  This is a patient whose diabetes I am reviewing once again.    She also follows with Cardiology secondary to a severe ischemic cardiomyopathy with an ejection fraction of 20%.  I note that she recently had Coreg increased to 9.375 b.i.d. from 6.25.  From a cardiac point of view, she really does not have much complaint.    With regards to her diabetes, she complains bitterly that I have changed her medicines from Glucovance to metformin.  She is now on a large dose of additional insulin and still we do not have control.   She is insistent that her diabetes was under control on the Glucovance.  However, looking at her records, her hemoglobin A1c was 11.3 in July of this year.  Currently, her blood sugars are over 200 fasting, in the high 200s later in the day, but seems to come under better control later on a.c. supper.    CURRENT MEDICATIONS:  Medication list is reviewed.             Lantus 25 U at bedtime.     Glucophage 1000 b.i.d.       Aspirin 81 q.d.       Digoxin 0.125 q.d.     Lasix 40 daily.       Crestor 20 q.d.      Cozaar 50 q.d.      Coreg 9.375 p.o. b.i.d. (recently increased).               REVIEW OF SYSTEMS:    CHEST/RESPIRATORY:  No shortness of breath.   CARDIAC:  No chest pain.   EDEMA/VARICOSITIES:  Extremities:  She has not noted any lower extremity edema in her remaining left leg.     GI:  No abdominal pain.   No diarrhea.     PHYSICAL EXAMINATION:   VITAL SIGNS:     TEMPERATURE:  97.8.     PULSE:  87.     RESPIRATIONS:  18.     BLOOD PRESSURE:  148/87.     02 SATURATIONS:  97% on room air.     WEIGHT:  148 pounds, which is down from 160 pounds  on 06/02/2015.     GENERAL APPEARANCE:  The patient is not in any distress.             CHEST/RESPIRATORY:  Clear air entry bilaterally.    CARDIOVASCULAR:   CARDIAC:  Heart sounds are normal.  There is no S3.  Her JVP is not elevated.   She looks to be euvolemic.       GASTROINTESTINAL:   ABDOMEN:  No masses.     LIVER/SPLEEN/KIDNEYS:  No liver, no spleen.  No tenderness.    GENITOURINARY:   BLADDER:  Not enlarged.    CIRCULATION:   EDEMA/VARICOSITIES:  Extremities:  Left leg:  Minimal  to no edema.     ASSESSMENT/PLAN:                   Ischemic cardiomyopathy.  This seems stable.   Her pulse rate today is in the 80s.  She might tolerate more Coreg than the cardiology office recently started her on.    Type 2 diabetes.  Poorly controlled.   I am going to increase her Lantus insulin to 35 U.  She is also on short-acting insulin 5 U a.c. meals, as well as the metformin.   We continually have discussions about her Glucovance/Metaglip.  I do not think adding this back instead of the metformin would add anything to this woman who is now requiring much higher doses of insulin, although I just do not seem to be able to get that through to her.     CPT CODE: 13086

## 2015-07-09 DIAGNOSIS — I255 Ischemic cardiomyopathy: Secondary | ICD-10-CM | POA: Diagnosis not present

## 2015-07-09 DIAGNOSIS — N182 Chronic kidney disease, stage 2 (mild): Secondary | ICD-10-CM | POA: Diagnosis not present

## 2015-07-09 DIAGNOSIS — Z794 Long term (current) use of insulin: Secondary | ICD-10-CM | POA: Diagnosis not present

## 2015-07-09 DIAGNOSIS — I5022 Chronic systolic (congestive) heart failure: Secondary | ICD-10-CM | POA: Diagnosis not present

## 2015-07-09 DIAGNOSIS — D6862 Lupus anticoagulant syndrome: Secondary | ICD-10-CM | POA: Diagnosis not present

## 2015-07-09 DIAGNOSIS — Z89611 Acquired absence of right leg above knee: Secondary | ICD-10-CM | POA: Diagnosis not present

## 2015-07-09 DIAGNOSIS — Z7984 Long term (current) use of oral hypoglycemic drugs: Secondary | ICD-10-CM | POA: Diagnosis not present

## 2015-07-09 DIAGNOSIS — I1310 Hypertensive heart and chronic kidney disease without heart failure, with stage 1 through stage 4 chronic kidney disease, or unspecified chronic kidney disease: Secondary | ICD-10-CM | POA: Diagnosis not present

## 2015-07-09 DIAGNOSIS — E1121 Type 2 diabetes mellitus with diabetic nephropathy: Secondary | ICD-10-CM | POA: Diagnosis not present

## 2015-07-09 DIAGNOSIS — M6281 Muscle weakness (generalized): Secondary | ICD-10-CM | POA: Diagnosis not present

## 2015-07-09 DIAGNOSIS — R2689 Other abnormalities of gait and mobility: Secondary | ICD-10-CM | POA: Diagnosis not present

## 2015-07-09 DIAGNOSIS — I252 Old myocardial infarction: Secondary | ICD-10-CM | POA: Diagnosis not present

## 2015-07-13 DIAGNOSIS — I5022 Chronic systolic (congestive) heart failure: Secondary | ICD-10-CM | POA: Diagnosis not present

## 2015-07-13 DIAGNOSIS — D6862 Lupus anticoagulant syndrome: Secondary | ICD-10-CM | POA: Diagnosis not present

## 2015-07-13 DIAGNOSIS — N182 Chronic kidney disease, stage 2 (mild): Secondary | ICD-10-CM | POA: Diagnosis not present

## 2015-07-13 DIAGNOSIS — E1121 Type 2 diabetes mellitus with diabetic nephropathy: Secondary | ICD-10-CM | POA: Diagnosis not present

## 2015-07-13 DIAGNOSIS — I255 Ischemic cardiomyopathy: Secondary | ICD-10-CM | POA: Diagnosis not present

## 2015-07-13 DIAGNOSIS — I1310 Hypertensive heart and chronic kidney disease without heart failure, with stage 1 through stage 4 chronic kidney disease, or unspecified chronic kidney disease: Secondary | ICD-10-CM | POA: Diagnosis not present

## 2015-07-14 DIAGNOSIS — I255 Ischemic cardiomyopathy: Secondary | ICD-10-CM | POA: Diagnosis not present

## 2015-07-14 DIAGNOSIS — E1121 Type 2 diabetes mellitus with diabetic nephropathy: Secondary | ICD-10-CM | POA: Diagnosis not present

## 2015-07-14 DIAGNOSIS — I5022 Chronic systolic (congestive) heart failure: Secondary | ICD-10-CM | POA: Diagnosis not present

## 2015-07-14 DIAGNOSIS — I1310 Hypertensive heart and chronic kidney disease without heart failure, with stage 1 through stage 4 chronic kidney disease, or unspecified chronic kidney disease: Secondary | ICD-10-CM | POA: Diagnosis not present

## 2015-07-14 DIAGNOSIS — N182 Chronic kidney disease, stage 2 (mild): Secondary | ICD-10-CM | POA: Diagnosis not present

## 2015-07-14 DIAGNOSIS — D6862 Lupus anticoagulant syndrome: Secondary | ICD-10-CM | POA: Diagnosis not present

## 2015-07-15 DIAGNOSIS — N182 Chronic kidney disease, stage 2 (mild): Secondary | ICD-10-CM | POA: Diagnosis not present

## 2015-07-15 DIAGNOSIS — I1310 Hypertensive heart and chronic kidney disease without heart failure, with stage 1 through stage 4 chronic kidney disease, or unspecified chronic kidney disease: Secondary | ICD-10-CM | POA: Diagnosis not present

## 2015-07-15 DIAGNOSIS — I255 Ischemic cardiomyopathy: Secondary | ICD-10-CM | POA: Diagnosis not present

## 2015-07-15 DIAGNOSIS — E1121 Type 2 diabetes mellitus with diabetic nephropathy: Secondary | ICD-10-CM | POA: Diagnosis not present

## 2015-07-15 DIAGNOSIS — I5022 Chronic systolic (congestive) heart failure: Secondary | ICD-10-CM | POA: Diagnosis not present

## 2015-07-15 DIAGNOSIS — D6862 Lupus anticoagulant syndrome: Secondary | ICD-10-CM | POA: Diagnosis not present

## 2015-07-16 ENCOUNTER — Other Ambulatory Visit (HOSPITAL_COMMUNITY): Payer: Medicare Other

## 2015-07-16 ENCOUNTER — Encounter (HOSPITAL_COMMUNITY): Payer: Medicare Other | Admitting: Internal Medicine

## 2015-07-16 DIAGNOSIS — I255 Ischemic cardiomyopathy: Secondary | ICD-10-CM | POA: Diagnosis not present

## 2015-07-16 DIAGNOSIS — D6862 Lupus anticoagulant syndrome: Secondary | ICD-10-CM | POA: Diagnosis not present

## 2015-07-16 DIAGNOSIS — N182 Chronic kidney disease, stage 2 (mild): Secondary | ICD-10-CM | POA: Diagnosis not present

## 2015-07-16 DIAGNOSIS — I1310 Hypertensive heart and chronic kidney disease without heart failure, with stage 1 through stage 4 chronic kidney disease, or unspecified chronic kidney disease: Secondary | ICD-10-CM | POA: Diagnosis not present

## 2015-07-16 DIAGNOSIS — I5022 Chronic systolic (congestive) heart failure: Secondary | ICD-10-CM | POA: Diagnosis not present

## 2015-07-16 DIAGNOSIS — E1121 Type 2 diabetes mellitus with diabetic nephropathy: Secondary | ICD-10-CM | POA: Diagnosis not present

## 2015-07-17 DIAGNOSIS — I255 Ischemic cardiomyopathy: Secondary | ICD-10-CM | POA: Diagnosis not present

## 2015-07-17 DIAGNOSIS — N182 Chronic kidney disease, stage 2 (mild): Secondary | ICD-10-CM | POA: Diagnosis not present

## 2015-07-17 DIAGNOSIS — I5022 Chronic systolic (congestive) heart failure: Secondary | ICD-10-CM | POA: Diagnosis not present

## 2015-07-17 DIAGNOSIS — I1310 Hypertensive heart and chronic kidney disease without heart failure, with stage 1 through stage 4 chronic kidney disease, or unspecified chronic kidney disease: Secondary | ICD-10-CM | POA: Diagnosis not present

## 2015-07-17 DIAGNOSIS — E1121 Type 2 diabetes mellitus with diabetic nephropathy: Secondary | ICD-10-CM | POA: Diagnosis not present

## 2015-07-17 DIAGNOSIS — D6862 Lupus anticoagulant syndrome: Secondary | ICD-10-CM | POA: Diagnosis not present

## 2015-07-20 DIAGNOSIS — I255 Ischemic cardiomyopathy: Secondary | ICD-10-CM | POA: Diagnosis not present

## 2015-07-20 DIAGNOSIS — E1121 Type 2 diabetes mellitus with diabetic nephropathy: Secondary | ICD-10-CM | POA: Diagnosis not present

## 2015-07-20 DIAGNOSIS — I5022 Chronic systolic (congestive) heart failure: Secondary | ICD-10-CM | POA: Diagnosis not present

## 2015-07-20 DIAGNOSIS — D6862 Lupus anticoagulant syndrome: Secondary | ICD-10-CM | POA: Diagnosis not present

## 2015-07-20 DIAGNOSIS — N182 Chronic kidney disease, stage 2 (mild): Secondary | ICD-10-CM | POA: Diagnosis not present

## 2015-07-20 DIAGNOSIS — I1310 Hypertensive heart and chronic kidney disease without heart failure, with stage 1 through stage 4 chronic kidney disease, or unspecified chronic kidney disease: Secondary | ICD-10-CM | POA: Diagnosis not present

## 2015-07-21 DIAGNOSIS — D6862 Lupus anticoagulant syndrome: Secondary | ICD-10-CM | POA: Diagnosis not present

## 2015-07-21 DIAGNOSIS — E1121 Type 2 diabetes mellitus with diabetic nephropathy: Secondary | ICD-10-CM | POA: Diagnosis not present

## 2015-07-21 DIAGNOSIS — N182 Chronic kidney disease, stage 2 (mild): Secondary | ICD-10-CM | POA: Diagnosis not present

## 2015-07-21 DIAGNOSIS — I1310 Hypertensive heart and chronic kidney disease without heart failure, with stage 1 through stage 4 chronic kidney disease, or unspecified chronic kidney disease: Secondary | ICD-10-CM | POA: Diagnosis not present

## 2015-07-21 DIAGNOSIS — I255 Ischemic cardiomyopathy: Secondary | ICD-10-CM | POA: Diagnosis not present

## 2015-07-21 DIAGNOSIS — I5022 Chronic systolic (congestive) heart failure: Secondary | ICD-10-CM | POA: Diagnosis not present

## 2015-07-22 DIAGNOSIS — I255 Ischemic cardiomyopathy: Secondary | ICD-10-CM | POA: Diagnosis not present

## 2015-07-22 DIAGNOSIS — N182 Chronic kidney disease, stage 2 (mild): Secondary | ICD-10-CM | POA: Diagnosis not present

## 2015-07-22 DIAGNOSIS — E1121 Type 2 diabetes mellitus with diabetic nephropathy: Secondary | ICD-10-CM | POA: Diagnosis not present

## 2015-07-22 DIAGNOSIS — D6862 Lupus anticoagulant syndrome: Secondary | ICD-10-CM | POA: Diagnosis not present

## 2015-07-22 DIAGNOSIS — I5022 Chronic systolic (congestive) heart failure: Secondary | ICD-10-CM | POA: Diagnosis not present

## 2015-07-22 DIAGNOSIS — I1310 Hypertensive heart and chronic kidney disease without heart failure, with stage 1 through stage 4 chronic kidney disease, or unspecified chronic kidney disease: Secondary | ICD-10-CM | POA: Diagnosis not present

## 2015-07-24 DIAGNOSIS — I5022 Chronic systolic (congestive) heart failure: Secondary | ICD-10-CM | POA: Diagnosis not present

## 2015-07-24 DIAGNOSIS — E1121 Type 2 diabetes mellitus with diabetic nephropathy: Secondary | ICD-10-CM | POA: Diagnosis not present

## 2015-07-24 DIAGNOSIS — D6862 Lupus anticoagulant syndrome: Secondary | ICD-10-CM | POA: Diagnosis not present

## 2015-07-24 DIAGNOSIS — I255 Ischemic cardiomyopathy: Secondary | ICD-10-CM | POA: Diagnosis not present

## 2015-07-24 DIAGNOSIS — I1310 Hypertensive heart and chronic kidney disease without heart failure, with stage 1 through stage 4 chronic kidney disease, or unspecified chronic kidney disease: Secondary | ICD-10-CM | POA: Diagnosis not present

## 2015-07-24 DIAGNOSIS — N182 Chronic kidney disease, stage 2 (mild): Secondary | ICD-10-CM | POA: Diagnosis not present

## 2015-07-27 ENCOUNTER — Ambulatory Visit (HOSPITAL_COMMUNITY)
Admission: RE | Admit: 2015-07-27 | Discharge: 2015-07-27 | Disposition: A | Discharge status: Home / Self Care | Payer: Medicare Other | Source: Ambulatory Visit | Attending: Internal Medicine | Admitting: Internal Medicine

## 2015-07-27 ENCOUNTER — Ambulatory Visit (HOSPITAL_BASED_OUTPATIENT_CLINIC_OR_DEPARTMENT_OTHER)
Admission: RE | Admit: 2015-07-27 | Discharge: 2015-07-27 | Disposition: A | Discharge status: Home / Self Care | Payer: Medicare Other | Source: Ambulatory Visit | Attending: Internal Medicine | Admitting: Internal Medicine

## 2015-07-27 ENCOUNTER — Encounter (HOSPITAL_COMMUNITY): Payer: Self-pay | Admitting: Internal Medicine

## 2015-07-27 VITALS — BP 108/60 | HR 62 | Wt 144.8 lb

## 2015-07-27 DIAGNOSIS — E119 Type 2 diabetes mellitus without complications: Secondary | ICD-10-CM | POA: Diagnosis not present

## 2015-07-27 DIAGNOSIS — I10 Essential (primary) hypertension: Secondary | ICD-10-CM | POA: Diagnosis not present

## 2015-07-27 DIAGNOSIS — I509 Heart failure, unspecified: Secondary | ICD-10-CM | POA: Diagnosis not present

## 2015-07-27 DIAGNOSIS — N182 Chronic kidney disease, stage 2 (mild): Secondary | ICD-10-CM | POA: Diagnosis not present

## 2015-07-27 DIAGNOSIS — I340 Nonrheumatic mitral (valve) insufficiency: Secondary | ICD-10-CM | POA: Insufficient documentation

## 2015-07-27 DIAGNOSIS — I2510 Atherosclerotic heart disease of native coronary artery without angina pectoris: Secondary | ICD-10-CM | POA: Diagnosis not present

## 2015-07-27 DIAGNOSIS — Z89611 Acquired absence of right leg above knee: Secondary | ICD-10-CM | POA: Diagnosis not present

## 2015-07-27 DIAGNOSIS — I255 Ischemic cardiomyopathy: Secondary | ICD-10-CM | POA: Diagnosis not present

## 2015-07-27 DIAGNOSIS — I2583 Coronary atherosclerosis due to lipid rich plaque: Secondary | ICD-10-CM

## 2015-07-27 DIAGNOSIS — I358 Other nonrheumatic aortic valve disorders: Secondary | ICD-10-CM | POA: Insufficient documentation

## 2015-07-27 DIAGNOSIS — E785 Hyperlipidemia, unspecified: Secondary | ICD-10-CM | POA: Diagnosis not present

## 2015-07-27 DIAGNOSIS — E1121 Type 2 diabetes mellitus with diabetic nephropathy: Secondary | ICD-10-CM | POA: Diagnosis not present

## 2015-07-27 DIAGNOSIS — I517 Cardiomegaly: Secondary | ICD-10-CM | POA: Diagnosis not present

## 2015-07-27 DIAGNOSIS — E559 Vitamin D deficiency, unspecified: Secondary | ICD-10-CM | POA: Diagnosis not present

## 2015-07-27 DIAGNOSIS — N19 Unspecified kidney failure: Secondary | ICD-10-CM | POA: Diagnosis not present

## 2015-07-27 DIAGNOSIS — I1310 Hypertensive heart and chronic kidney disease without heart failure, with stage 1 through stage 4 chronic kidney disease, or unspecified chronic kidney disease: Secondary | ICD-10-CM | POA: Diagnosis not present

## 2015-07-27 DIAGNOSIS — D6862 Lupus anticoagulant syndrome: Secondary | ICD-10-CM | POA: Diagnosis not present

## 2015-07-27 DIAGNOSIS — I119 Hypertensive heart disease without heart failure: Secondary | ICD-10-CM | POA: Diagnosis not present

## 2015-07-27 DIAGNOSIS — F419 Anxiety disorder, unspecified: Secondary | ICD-10-CM | POA: Diagnosis not present

## 2015-07-27 DIAGNOSIS — I5022 Chronic systolic (congestive) heart failure: Secondary | ICD-10-CM

## 2015-07-27 DIAGNOSIS — E784 Other hyperlipidemia: Secondary | ICD-10-CM | POA: Diagnosis not present

## 2015-07-27 LAB — BASIC METABOLIC PANEL
Anion gap: 10 (ref 5–15)
BUN: 26 mg/dL — AB (ref 6–20)
CO2: 27 mmol/L (ref 22–32)
Calcium: 10 mg/dL (ref 8.9–10.3)
Chloride: 104 mmol/L (ref 101–111)
Creatinine, Ser: 1.41 mg/dL — ABNORMAL HIGH (ref 0.44–1.00)
GFR calc Af Amer: 45 mL/min — ABNORMAL LOW (ref >60)
GFR, EST NON AFRICAN AMERICAN: 39 mL/min — AB (ref >60)
Glucose, Bld: 79 mg/dL (ref 65–99)
Potassium: 4.4 mmol/L (ref 3.5–5.1)
SODIUM: 141 mmol/L (ref 135–145)

## 2015-07-27 NOTE — Progress Notes (Signed)
Advanced Heart Failure Medication Review by a Pharmacist  Does the patient  feel that his/her medications are working for him/her?  yes  Has the patient been experiencing any side effects to the medications prescribed?  no  Does the patient measure his/her own blood pressure or blood glucose at home?  yes   Does the patient have any problems obtaining medications due to transportation or finances?   no  Understanding of regimen: fair Understanding of indications: fair Potential of compliance: fair Patient understands to avoid NSAIDs. Patient understands to avoid decongestants.  Issues to address at subsequent visits: None   Pharmacist comments:  Ms. Sorber is a pleasant 64 yo F presenting with a recent medication list. She has recently been discharged from [facility] and states that she manages her medications now. She seems somewhat confused about her regimen and what she is supposed to be taking. She also had potassium on her medication list but this was not noted on ours. Called [facility] to verify that she picked up a 30 day supply of K-dur 10 meq daily. I showed her a picture of the tablets and she states that she has not been taking them. She did not have any medication-related questions or concerns for me at this time.   Ruta Hinds. Velva [name], PharmD, BCPS, CPP Clinical Pharmacist Pager: [phone] Phone: [phone] 07/27/2015 10:52 AM      Time with patient: 6 minutes Preparation and documentation time: 10 minutes Total time: 16 minutes

## 2015-07-27 NOTE — Progress Notes (Signed)
*  PRELIMINARY RESULTS* Echocardiogram 2D Echocardiogram has been performed.  Leavy Cella 07/27/2015, 11:04 AM

## 2015-07-27 NOTE — Progress Notes (Signed)
Advanced Heart Failure Clinic Note   Patient ID: Jenna Brown, female   DOB: August 27, 1951, 64 y.o.   MRN: [id number]   PCP: Primary HF Cardiologist: Dr Haroldine Laws   HPI: Jenna Brown is a 64 year old female with hyperlipidemia, CVA, hypertension, type 2 diabetes mellitus, severe CAD, right AKA .   Admitted to [facility] 04/10/15 with increased dyspnea. Had RHC/LHC as noted below. Not candidate for CABG per Dr Prescott Gum due to comorbidities and poor targets. Required short term milrinone for cardiogenic shock. As she improved and fully diuresed, milrinone stopped. She had recommendations for lasix, spiro, losartan, hydralazine, and imdur.  Discharge weight was 158 pounds.     She returns for regular follow up. At last visit we increased her coreg.  Did not add spiro or increase losartan as she refused blood work. Denies SOB/PND/Orthopnea. No CP.  At home now with her Aunt. Mostly in wheelchair says hard to get around because leg prosthesis is too heavy. In process of getting lighter prosthesis. Has potassium filled 07/09/15, but was not on list of medications, so has not been taking.   RHC/LHC 04/14/15  Prox LAD to Mid LAD lesion, 90% stenosed. Diffuse disease  Ost Cx to Mid Cx lesion, 95% stenosed.  Ost 2nd Mrg to 2nd Mrg lesion, 80% stenosed.  Mid Cx lesion, 90% stenosed.  Prox RCA lesion, 60% stenosed.  Mid RCA lesion, 50% stenosed.   Labs 04/22/2015 K 4.5 Creatinine 1.24  Labs 05/14/2015; K 4.3  Labs 06/10/15; K 4.1, Creatinine 1.34  ROS: All systems negative except as listed in HPI, PMH and Problem List.  SH:  Social History   Social History  . Marital Status: Single    Spouse Name: N/A  . Number of Children: N/A  . Years of Education: N/A   Occupational History  . Not on file.   Social History Main Topics  . Smoking status: Former Smoker -- 0.25 packs/day for 40 years    Types: Cigarettes    Quit date: 02/27/2012  . Smokeless tobacco: Never Used  . Alcohol Use: No  . Drug Use:  No  . Sexual Activity: No   Other Topics Concern  . Not on file   Social History Narrative   FH:  Family History  Problem Relation Age of Onset  . Cancer Mother     breast  . Diabetes Father     Past Medical History  Diagnosis Date  . Hyperlipidemia     takes Simvastatin daily  . Peripheral vascular disease ([place])   . Stroke [facility])     TIA history  . PONV (postoperative nausea and vomiting)   . Hypertension     takes Amlodipine/HCTZ/Losartan daily  . Vertigo     takes Ativert prn  . Hemorrhoids   . Anxiety     takes Xanax prn  . Myocardial infarction ([place]) 1990's  . Type II diabetes mellitus ([facility])     takes Glucovance and Januvia daily  . H/O hiatal hernia   . GERD (gastroesophageal reflux disease)   . Headache(784.0)     when b/p is elevated  . Migraines   . Arthritis     Current Outpatient Prescriptions  Medication Sig Dispense Refill  . acetaminophen (TYLENOL) 650 MG CR tablet Take 650 mg by mouth every 8 (eight) hours as needed for pain.    Marland Kitchen ALPRAZolam (XANAX) 0.25 MG tablet Take 0.25 mg by mouth 3 (three) times daily as needed for anxiety.     Marland Kitchen aspirin  81 MG chewable tablet Chew 81 mg by mouth daily.    . carvedilol (COREG) 3.125 MG tablet Take 3 tablets (9.375 mg total) by mouth 2 (two) times daily with a meal. 90 tablet 3  . digoxin (LANOXIN) 0.125 MG tablet Take 1 tablet (0.125 mg total) by mouth daily. 90 tablet 3  . furosemide (LASIX) 40 MG tablet Take 1 tablet (40 mg total) by mouth daily. 90 tablet 3  . insulin glargine (LANTUS) 100 UNIT/ML injection Inject 35 Units into the skin at bedtime.     . insulin lispro (HUMALOG) 100 UNIT/ML injection Inject 5 Units into the skin 3 (three) times daily before meals.    . Insulin Pen Needle (AURORA PEN NEEDLES) 29G X 12MM MISC 5 Units by Does not apply route 3 (three) times daily before meals. 100 each 0  . losartan (COZAAR) 50 MG tablet Take 1 tablet (50 mg total) by mouth daily.    . meclizine (ANTIVERT) 25  MG tablet Take 25 mg by mouth daily as needed for dizziness.     . metFORMIN (GLUCOPHAGE) 1000 MG tablet Take 1,000 mg by mouth 2 (two) times daily with a meal.    . rosuvastatin (CRESTOR) 20 MG tablet Take 20 mg by mouth daily.     No current facility-administered medications for this encounter.    Filed Vitals:   07/27/15 1017  BP: 108/60  Pulse: 62  Weight: 144 lb 12 oz (65.658 kg)  SpO2: 98%    PHYSICAL EXAM:  General:  Elderly. frail appearing. No resp difficulty. Arrived in a wheelchair.  HEENT: normal Neck: supple. JVP flat. Carotids 2+ bilaterally; no bruits. No lymphadenopathy or thryomegaly appreciated. Cor: PMI normal. Regular rate & rhythm. No rubs, gallops or murmurs. Lungs: clear Abdomen: soft, nontender, nondistended. No hepatosplenomegaly. No bruits or masses. Good bowel sounds. Extremities: no cyanosis, clubbing, rash, LLE no edema. R AKA  Neuro: alert & orientedx3, cranial nerves grossly intact. Moves all 4 extremities w/o difficulty. Affect pleasant.  ASSESSMENT & PLAN: 1. Chronic Systolic Heart Failure  On cath 04/14/15 ICM- 03/2015 ECHO EF 20%. Echo today viewed personally EF 20-25% Remains NYHA II. Volume status stable. Continue lasix 40 mg daily.  - Echo today shows EF 20-25%. RV OK. Discussed possibility of ICD today, she is not interested  -Continue losartan  50 mg daily, carvedilol 9.375 mg BID, digoxin 0.125 mg daily.  - No room for addition of meds with SBP in 100s and pulse in 60s. - Has HH with RN and PT now at home 2.CAD with ICM per cath 7/26  - No chest pain. On BB and aspirin - Limited increase of statin with aches. Consider increase at next visit. - Poor revascularization candidate with age, comorbidities, and extent of CAD. 3. PAD  4. R AKA 5. DMII  6. TIA - on statin and aspirin   [name] PA-C  10:49 AM   Patient seen and examined with Oda Kilts, PA-C. We discussed all aspects of the encounter. I agree with the  assessment and plan as stated above.   Overall stable NYHA III due to severe ischemic CM. Volume status ok. Echo images reviewed personally and EF remains 20-25%. I also reviewed cath films and LAD is very small vessel which is not a target for revascularization. There is a severe proximal LCX lesion which might be amenable to PCI. But doubt this would improve LV function and she is currently not having any angina so we will proceed  with medical therapy. She refuses ICD. Can consider addition of Plavix at some point.   Lemario Chaikin,MD 1:05 PM

## 2015-07-27 NOTE — Patient Instructions (Signed)
Labs today will call if abnormal  Continue current medications  Follow up 6-8 weeks

## 2015-07-30 DIAGNOSIS — I255 Ischemic cardiomyopathy: Secondary | ICD-10-CM | POA: Diagnosis not present

## 2015-07-30 DIAGNOSIS — E1121 Type 2 diabetes mellitus with diabetic nephropathy: Secondary | ICD-10-CM | POA: Diagnosis not present

## 2015-07-30 DIAGNOSIS — I5022 Chronic systolic (congestive) heart failure: Secondary | ICD-10-CM | POA: Diagnosis not present

## 2015-07-30 DIAGNOSIS — I1310 Hypertensive heart and chronic kidney disease without heart failure, with stage 1 through stage 4 chronic kidney disease, or unspecified chronic kidney disease: Secondary | ICD-10-CM | POA: Diagnosis not present

## 2015-07-30 DIAGNOSIS — D6862 Lupus anticoagulant syndrome: Secondary | ICD-10-CM | POA: Diagnosis not present

## 2015-07-30 DIAGNOSIS — N182 Chronic kidney disease, stage 2 (mild): Secondary | ICD-10-CM | POA: Diagnosis not present

## 2015-08-01 DIAGNOSIS — E1121 Type 2 diabetes mellitus with diabetic nephropathy: Secondary | ICD-10-CM | POA: Diagnosis not present

## 2015-08-01 DIAGNOSIS — D6862 Lupus anticoagulant syndrome: Secondary | ICD-10-CM | POA: Diagnosis not present

## 2015-08-01 DIAGNOSIS — I5022 Chronic systolic (congestive) heart failure: Secondary | ICD-10-CM | POA: Diagnosis not present

## 2015-08-01 DIAGNOSIS — N182 Chronic kidney disease, stage 2 (mild): Secondary | ICD-10-CM | POA: Diagnosis not present

## 2015-08-01 DIAGNOSIS — I255 Ischemic cardiomyopathy: Secondary | ICD-10-CM | POA: Diagnosis not present

## 2015-08-01 DIAGNOSIS — I1310 Hypertensive heart and chronic kidney disease without heart failure, with stage 1 through stage 4 chronic kidney disease, or unspecified chronic kidney disease: Secondary | ICD-10-CM | POA: Diagnosis not present

## 2015-08-03 DIAGNOSIS — I1310 Hypertensive heart and chronic kidney disease without heart failure, with stage 1 through stage 4 chronic kidney disease, or unspecified chronic kidney disease: Secondary | ICD-10-CM | POA: Diagnosis not present

## 2015-08-03 DIAGNOSIS — N182 Chronic kidney disease, stage 2 (mild): Secondary | ICD-10-CM | POA: Diagnosis not present

## 2015-08-03 DIAGNOSIS — I5022 Chronic systolic (congestive) heart failure: Secondary | ICD-10-CM | POA: Diagnosis not present

## 2015-08-03 DIAGNOSIS — I255 Ischemic cardiomyopathy: Secondary | ICD-10-CM | POA: Diagnosis not present

## 2015-08-03 DIAGNOSIS — E1121 Type 2 diabetes mellitus with diabetic nephropathy: Secondary | ICD-10-CM | POA: Diagnosis not present

## 2015-08-03 DIAGNOSIS — D6862 Lupus anticoagulant syndrome: Secondary | ICD-10-CM | POA: Diagnosis not present

## 2015-08-06 DIAGNOSIS — D6862 Lupus anticoagulant syndrome: Secondary | ICD-10-CM | POA: Diagnosis not present

## 2015-08-06 DIAGNOSIS — E1121 Type 2 diabetes mellitus with diabetic nephropathy: Secondary | ICD-10-CM | POA: Diagnosis not present

## 2015-08-06 DIAGNOSIS — I255 Ischemic cardiomyopathy: Secondary | ICD-10-CM | POA: Diagnosis not present

## 2015-08-06 DIAGNOSIS — N182 Chronic kidney disease, stage 2 (mild): Secondary | ICD-10-CM | POA: Diagnosis not present

## 2015-08-06 DIAGNOSIS — I5022 Chronic systolic (congestive) heart failure: Secondary | ICD-10-CM | POA: Diagnosis not present

## 2015-08-06 DIAGNOSIS — I1310 Hypertensive heart and chronic kidney disease without heart failure, with stage 1 through stage 4 chronic kidney disease, or unspecified chronic kidney disease: Secondary | ICD-10-CM | POA: Diagnosis not present

## 2015-08-07 DIAGNOSIS — D6862 Lupus anticoagulant syndrome: Secondary | ICD-10-CM | POA: Diagnosis not present

## 2015-08-07 DIAGNOSIS — N182 Chronic kidney disease, stage 2 (mild): Secondary | ICD-10-CM | POA: Diagnosis not present

## 2015-08-07 DIAGNOSIS — E1121 Type 2 diabetes mellitus with diabetic nephropathy: Secondary | ICD-10-CM | POA: Diagnosis not present

## 2015-08-07 DIAGNOSIS — I5022 Chronic systolic (congestive) heart failure: Secondary | ICD-10-CM | POA: Diagnosis not present

## 2015-08-07 DIAGNOSIS — I255 Ischemic cardiomyopathy: Secondary | ICD-10-CM | POA: Diagnosis not present

## 2015-08-07 DIAGNOSIS — I1310 Hypertensive heart and chronic kidney disease without heart failure, with stage 1 through stage 4 chronic kidney disease, or unspecified chronic kidney disease: Secondary | ICD-10-CM | POA: Diagnosis not present

## 2015-08-09 DIAGNOSIS — D6862 Lupus anticoagulant syndrome: Secondary | ICD-10-CM | POA: Diagnosis not present

## 2015-08-09 DIAGNOSIS — I255 Ischemic cardiomyopathy: Secondary | ICD-10-CM | POA: Diagnosis not present

## 2015-08-09 DIAGNOSIS — N182 Chronic kidney disease, stage 2 (mild): Secondary | ICD-10-CM | POA: Diagnosis not present

## 2015-08-09 DIAGNOSIS — I5022 Chronic systolic (congestive) heart failure: Secondary | ICD-10-CM | POA: Diagnosis not present

## 2015-08-09 DIAGNOSIS — I1310 Hypertensive heart and chronic kidney disease without heart failure, with stage 1 through stage 4 chronic kidney disease, or unspecified chronic kidney disease: Secondary | ICD-10-CM | POA: Diagnosis not present

## 2015-08-09 DIAGNOSIS — E1121 Type 2 diabetes mellitus with diabetic nephropathy: Secondary | ICD-10-CM | POA: Diagnosis not present

## 2015-08-11 ENCOUNTER — Emergency Department (HOSPITAL_BASED_OUTPATIENT_CLINIC_OR_DEPARTMENT_OTHER): Payer: Medicare Other

## 2015-08-11 ENCOUNTER — Encounter (HOSPITAL_BASED_OUTPATIENT_CLINIC_OR_DEPARTMENT_OTHER): Payer: Self-pay | Admitting: Emergency Medicine

## 2015-08-11 ENCOUNTER — Inpatient Hospital Stay (HOSPITAL_BASED_OUTPATIENT_CLINIC_OR_DEPARTMENT_OTHER)
Admission: EM | Admit: 2015-08-11 | Discharge: 2015-08-13 | DRG: 281 | Disposition: A | Discharge status: Home / Self Care | Payer: Medicare Other | Attending: Internal Medicine | Admitting: Internal Medicine

## 2015-08-11 DIAGNOSIS — Z7982 Long term (current) use of aspirin: Secondary | ICD-10-CM | POA: Diagnosis not present

## 2015-08-11 DIAGNOSIS — I110 Hypertensive heart disease with heart failure: Secondary | ICD-10-CM | POA: Diagnosis not present

## 2015-08-11 DIAGNOSIS — I5042 Chronic combined systolic (congestive) and diastolic (congestive) heart failure: Secondary | ICD-10-CM | POA: Diagnosis present

## 2015-08-11 DIAGNOSIS — I739 Peripheral vascular disease, unspecified: Secondary | ICD-10-CM | POA: Diagnosis present

## 2015-08-11 DIAGNOSIS — Z7984 Long term (current) use of oral hypoglycemic drugs: Secondary | ICD-10-CM | POA: Diagnosis not present

## 2015-08-11 DIAGNOSIS — Z794 Long term (current) use of insulin: Secondary | ICD-10-CM

## 2015-08-11 DIAGNOSIS — E785 Hyperlipidemia, unspecified: Secondary | ICD-10-CM | POA: Diagnosis not present

## 2015-08-11 DIAGNOSIS — I5022 Chronic systolic (congestive) heart failure: Secondary | ICD-10-CM | POA: Diagnosis present

## 2015-08-11 DIAGNOSIS — Z87891 Personal history of nicotine dependence: Secondary | ICD-10-CM

## 2015-08-11 DIAGNOSIS — I255 Ischemic cardiomyopathy: Secondary | ICD-10-CM | POA: Diagnosis not present

## 2015-08-11 DIAGNOSIS — I252 Old myocardial infarction: Secondary | ICD-10-CM | POA: Diagnosis not present

## 2015-08-11 DIAGNOSIS — F419 Anxiety disorder, unspecified: Secondary | ICD-10-CM | POA: Diagnosis present

## 2015-08-11 DIAGNOSIS — I259 Chronic ischemic heart disease, unspecified: Secondary | ICD-10-CM | POA: Diagnosis not present

## 2015-08-11 DIAGNOSIS — I10 Essential (primary) hypertension: Secondary | ICD-10-CM | POA: Diagnosis present

## 2015-08-11 DIAGNOSIS — I2510 Atherosclerotic heart disease of native coronary artery without angina pectoris: Secondary | ICD-10-CM | POA: Diagnosis present

## 2015-08-11 DIAGNOSIS — R079 Chest pain, unspecified: Secondary | ICD-10-CM | POA: Diagnosis not present

## 2015-08-11 DIAGNOSIS — Z8673 Personal history of transient ischemic attack (TIA), and cerebral infarction without residual deficits: Secondary | ICD-10-CM

## 2015-08-11 DIAGNOSIS — E119 Type 2 diabetes mellitus without complications: Secondary | ICD-10-CM | POA: Diagnosis present

## 2015-08-11 DIAGNOSIS — R072 Precordial pain: Secondary | ICD-10-CM | POA: Diagnosis not present

## 2015-08-11 DIAGNOSIS — M1990 Unspecified osteoarthritis, unspecified site: Secondary | ICD-10-CM | POA: Diagnosis present

## 2015-08-11 DIAGNOSIS — I25119 Atherosclerotic heart disease of native coronary artery with unspecified angina pectoris: Secondary | ICD-10-CM | POA: Diagnosis not present

## 2015-08-11 DIAGNOSIS — I639 Cerebral infarction, unspecified: Secondary | ICD-10-CM | POA: Diagnosis present

## 2015-08-11 DIAGNOSIS — I998 Other disorder of circulatory system: Secondary | ICD-10-CM | POA: Diagnosis not present

## 2015-08-11 DIAGNOSIS — D72829 Elevated white blood cell count, unspecified: Secondary | ICD-10-CM | POA: Diagnosis present

## 2015-08-11 DIAGNOSIS — I214 Non-ST elevation (NSTEMI) myocardial infarction: Principal | ICD-10-CM | POA: Diagnosis present

## 2015-08-11 DIAGNOSIS — D649 Anemia, unspecified: Secondary | ICD-10-CM | POA: Diagnosis present

## 2015-08-11 DIAGNOSIS — Z79899 Other long term (current) drug therapy: Secondary | ICD-10-CM

## 2015-08-11 DIAGNOSIS — K219 Gastro-esophageal reflux disease without esophagitis: Secondary | ICD-10-CM | POA: Diagnosis present

## 2015-08-11 DIAGNOSIS — Z89611 Acquired absence of right leg above knee: Secondary | ICD-10-CM | POA: Diagnosis not present

## 2015-08-11 DIAGNOSIS — R0789 Other chest pain: Secondary | ICD-10-CM | POA: Diagnosis not present

## 2015-08-11 LAB — LIPID PANEL
CHOL/HDL RATIO: 4.8 ratio
Cholesterol: 164 mg/dL (ref 0–200)
HDL: 34 mg/dL — ABNORMAL LOW (ref >40)
LDL CALC: 111 mg/dL — AB (ref 0–99)
TRIGLYCERIDES: 94 mg/dL (ref <150)
VLDL: 19 mg/dL (ref 0–40)

## 2015-08-11 LAB — BASIC METABOLIC PANEL
ANION GAP: 11 (ref 5–15)
Anion gap: 8 (ref 5–15)
BUN: 36 mg/dL — ABNORMAL HIGH (ref 6–20)
BUN: 32 mg/dL — AB (ref 6–20)
CALCIUM: 9.6 mg/dL (ref 8.9–10.3)
CHLORIDE: 101 mmol/L (ref 101–111)
CO2: 27 mmol/L (ref 22–32)
CO2: 27 mmol/L (ref 22–32)
CREATININE: 1.32 mg/dL — AB (ref 0.44–1.00)
Calcium: 9.2 mg/dL (ref 8.9–10.3)
Chloride: 98 mmol/L — ABNORMAL LOW (ref 101–111)
Creatinine, Ser: 1.25 mg/dL — ABNORMAL HIGH (ref 0.44–1.00)
GFR calc Af Amer: 52 mL/min — ABNORMAL LOW (ref >60)
GFR calc Af Amer: 49 mL/min — ABNORMAL LOW (ref >60)
GFR calc non Af Amer: 42 mL/min — ABNORMAL LOW (ref >60)
GFR, EST NON AFRICAN AMERICAN: 45 mL/min — AB (ref >60)
GLUCOSE: 201 mg/dL — AB (ref 65–99)
Glucose, Bld: 235 mg/dL — ABNORMAL HIGH (ref 65–99)
POTASSIUM: 4.7 mmol/L (ref 3.5–5.1)
Potassium: 4.3 mmol/L (ref 3.5–5.1)
Sodium: 136 mmol/L (ref 135–145)
Sodium: 136 mmol/L (ref 135–145)

## 2015-08-11 LAB — CBC
HCT: 33 % — ABNORMAL LOW (ref 36.0–46.0)
HEMATOCRIT: 30.8 % — AB (ref 36.0–46.0)
HEMOGLOBIN: 10.3 g/dL — AB (ref 12.0–15.0)
Hemoglobin: 9.6 g/dL — ABNORMAL LOW (ref 12.0–15.0)
MCH: 24.9 pg — AB (ref 26.0–34.0)
MCH: 25 pg — ABNORMAL LOW (ref 26.0–34.0)
MCHC: 31.2 g/dL (ref 30.0–36.0)
MCHC: 31.2 g/dL (ref 30.0–36.0)
MCV: 80 fL (ref 78.0–100.0)
MCV: 80.1 fL (ref 78.0–100.0)
PLATELETS: 313 10*3/uL (ref 150–400)
Platelets: 325 10*3/uL (ref 150–400)
RBC: 3.85 MIL/uL — ABNORMAL LOW (ref 3.87–5.11)
RBC: 4.12 MIL/uL (ref 3.87–5.11)
RDW: 13.6 % (ref 11.5–15.5)
RDW: 13.7 % (ref 11.5–15.5)
WBC: 11 10*3/uL — ABNORMAL HIGH (ref 4.0–10.5)
WBC: 12.4 10*3/uL — ABNORMAL HIGH (ref 4.0–10.5)

## 2015-08-11 LAB — PROTIME-INR
INR: 0.93 (ref 0.00–1.49)
INR: 1.11 (ref 0.00–1.49)
PROTHROMBIN TIME: 12.7 s (ref 11.6–15.2)
Prothrombin Time: 14.5 seconds (ref 11.6–15.2)

## 2015-08-11 LAB — DIFFERENTIAL
BASOS PCT: 0 %
Basophils Absolute: 0 10*3/uL (ref 0.0–0.1)
EOS ABS: 0.1 10*3/uL (ref 0.0–0.7)
EOS PCT: 1 %
LYMPHS PCT: 25 %
Lymphs Abs: 3.1 10*3/uL (ref 0.7–4.0)
Monocytes Absolute: 0.8 10*3/uL (ref 0.1–1.0)
Monocytes Relative: 7 %
NEUTROS PCT: 67 %
Neutro Abs: 8.4 10*3/uL — ABNORMAL HIGH (ref 1.7–7.7)

## 2015-08-11 LAB — GLUCOSE, CAPILLARY
GLUCOSE-CAPILLARY: 130 mg/dL — AB (ref 65–99)
GLUCOSE-CAPILLARY: 258 mg/dL — AB (ref 65–99)

## 2015-08-11 LAB — DIGOXIN LEVEL: Digoxin Level: 0.3 ng/mL — ABNORMAL LOW (ref 0.8–2.0)

## 2015-08-11 LAB — TROPONIN I
TROPONIN I: 0.04 ng/mL — AB (ref <0.031)
TROPONIN I: 0.41 ng/mL — AB (ref <0.031)
Troponin I: 1.94 ng/mL (ref <0.031)
Troponin I: 1.91 ng/mL (ref <0.031)

## 2015-08-11 LAB — HEPARIN LEVEL (UNFRACTIONATED): Heparin Unfractionated: 0.53 IU/mL (ref 0.30–0.70)

## 2015-08-11 LAB — APTT: APTT: 32 s (ref 24–37)

## 2015-08-11 LAB — MRSA PCR SCREENING: MRSA BY PCR: NEGATIVE

## 2015-08-11 MED ORDER — LOSARTAN POTASSIUM 50 MG PO TABS
50.0000 mg | ORAL_TABLET | Freq: Every day | ORAL | Status: DC
  Administered 2015-08-11 – 2015-08-13 (×3): 50 mg via ORAL
  Filled 2015-08-11 (×3): qty 1

## 2015-08-11 MED ORDER — NITROGLYCERIN 0.4 MG SL SUBL: 0.4000 mg | SUBLINGUAL_TABLET | SUBLINGUAL | Status: DC | PRN

## 2015-08-11 MED ORDER — ISOSORBIDE MONONITRATE ER 30 MG PO TB24
30.0000 mg | ORAL_TABLET | Freq: Every day | ORAL | Status: DC
  Administered 2015-08-11 – 2015-08-13 (×3): 30 mg via ORAL
  Filled 2015-08-11 (×3): qty 1

## 2015-08-11 MED ORDER — ONDANSETRON HCL 4 MG/2ML IJ SOLN: 4.0000 mg | Freq: Four times a day (QID) | INTRAMUSCULAR | Status: DC | PRN

## 2015-08-11 MED ORDER — NITROGLYCERIN IN D5W 200-5 MCG/ML-% IV SOLN
3.0000 ug/min | INTRAVENOUS | Status: DC
  Administered 2015-08-11: 5 ug/min via INTRAVENOUS
  Filled 2015-08-11: qty 250

## 2015-08-11 MED ORDER — TICAGRELOR 90 MG PO TABS
90.0000 mg | ORAL_TABLET | Freq: Two times a day (BID) | ORAL | Status: DC
  Administered 2015-08-11 – 2015-08-13 (×5): 90 mg via ORAL
  Filled 2015-08-11 (×5): qty 1

## 2015-08-11 MED ORDER — ASPIRIN 81 MG PO CHEW
324.0000 mg | CHEWABLE_TABLET | Freq: Once | ORAL | Status: AC
  Administered 2015-08-11: 324 mg via ORAL
  Filled 2015-08-11: qty 4

## 2015-08-11 MED ORDER — MECLIZINE HCL 12.5 MG PO TABS: 25.0000 mg | ORAL_TABLET | Freq: Every day | ORAL | Status: DC | PRN

## 2015-08-11 MED ORDER — ASPIRIN 300 MG RE SUPP: 300.0000 mg | RECTAL | Status: AC

## 2015-08-11 MED ORDER — HEPARIN SODIUM (PORCINE) 5000 UNIT/ML IJ SOLN
60.0000 [IU]/kg | INTRAMUSCULAR | Status: AC
  Administered 2015-08-11: 3900 [IU] via INTRAVENOUS
  Filled 2015-08-11: qty 1

## 2015-08-11 MED ORDER — ALPRAZOLAM 0.25 MG PO TABS: 0.2500 mg | ORAL_TABLET | Freq: Three times a day (TID) | ORAL | Status: DC | PRN

## 2015-08-11 MED ORDER — INSULIN ASPART 100 UNIT/ML ~~LOC~~ SOLN
0.0000 [IU] | Freq: Three times a day (TID) | SUBCUTANEOUS | Status: DC
  Administered 2015-08-11: 2 [IU] via SUBCUTANEOUS
  Administered 2015-08-11: 8 [IU] via SUBCUTANEOUS
  Administered 2015-08-12 (×2): 3 [IU] via SUBCUTANEOUS
  Administered 2015-08-12 – 2015-08-13 (×2): 5 [IU] via SUBCUTANEOUS
  Administered 2015-08-13: 8 [IU] via SUBCUTANEOUS

## 2015-08-11 MED ORDER — ROSUVASTATIN CALCIUM 20 MG PO TABS
20.0000 mg | ORAL_TABLET | Freq: Every day | ORAL | Status: DC
  Administered 2015-08-11 – 2015-08-13 (×3): 20 mg via ORAL
  Filled 2015-08-11 (×3): qty 1

## 2015-08-11 MED ORDER — INSULIN ASPART 100 UNIT/ML ~~LOC~~ SOLN
0.0000 [IU] | Freq: Every day | SUBCUTANEOUS | Status: DC
Start: 2015-08-11 — End: 2015-08-13
  Administered 2015-08-11: 4 [IU] via SUBCUTANEOUS
  Administered 2015-08-12: 2 [IU] via SUBCUTANEOUS

## 2015-08-11 MED ORDER — ASPIRIN 81 MG PO CHEW: 324.0000 mg | CHEWABLE_TABLET | ORAL | Status: AC

## 2015-08-11 MED ORDER — ASPIRIN 81 MG PO CHEW: 81.0000 mg | CHEWABLE_TABLET | Freq: Every day | ORAL | Status: DC

## 2015-08-11 MED ORDER — HEPARIN (PORCINE) IN NACL 100-0.45 UNIT/ML-% IJ SOLN
1000.0000 [IU]/h | INTRAMUSCULAR | Status: DC
  Administered 2015-08-11 – 2015-08-12 (×3): 1000 [IU]/h via INTRAVENOUS
  Filled 2015-08-11 (×3): qty 250

## 2015-08-11 MED ORDER — CARVEDILOL 6.25 MG PO TABS
9.3750 mg | ORAL_TABLET | Freq: Two times a day (BID) | ORAL | Status: DC
  Administered 2015-08-11 – 2015-08-13 (×5): 9.375 mg via ORAL
  Filled 2015-08-11 (×5): qty 1

## 2015-08-11 MED ORDER — SODIUM CHLORIDE 0.9 % IV SOLN
INTRAVENOUS | Status: DC
  Administered 2015-08-11: 1000 mL via INTRAVENOUS

## 2015-08-11 MED ORDER — ASPIRIN EC 81 MG PO TBEC
81.0000 mg | DELAYED_RELEASE_TABLET | Freq: Every day | ORAL | Status: DC
  Administered 2015-08-12 – 2015-08-13 (×2): 81 mg via ORAL
  Filled 2015-08-11 (×2): qty 1

## 2015-08-11 MED ORDER — INSULIN ASPART 100 UNIT/ML ~~LOC~~ SOLN
5.0000 [IU] | Freq: Three times a day (TID) | SUBCUTANEOUS | Status: DC
  Administered 2015-08-11 – 2015-08-12 (×3): 5 [IU] via SUBCUTANEOUS

## 2015-08-11 MED ORDER — INSULIN GLARGINE 100 UNIT/ML ~~LOC~~ SOLN
30.0000 [IU] | Freq: Every day | SUBCUTANEOUS | Status: DC
  Administered 2015-08-11: 30 [IU] via SUBCUTANEOUS
  Filled 2015-08-11 (×3): qty 0.3

## 2015-08-11 MED ORDER — DIGOXIN 125 MCG PO TABS
0.1250 mg | ORAL_TABLET | Freq: Every day | ORAL | Status: DC
  Administered 2015-08-11 – 2015-08-13 (×3): 0.125 mg via ORAL
  Filled 2015-08-11 (×3): qty 1

## 2015-08-11 MED ORDER — FUROSEMIDE 40 MG PO TABS
40.0000 mg | ORAL_TABLET | Freq: Every day | ORAL | Status: DC
  Administered 2015-08-11 – 2015-08-13 (×3): 40 mg via ORAL
  Filled 2015-08-11 (×3): qty 1

## 2015-08-11 MED ORDER — NITROGLYCERIN 0.4 MG SL SUBL
SUBLINGUAL_TABLET | SUBLINGUAL | Status: AC
  Filled 2015-08-11: qty 3

## 2015-08-11 NOTE — Progress Notes (Signed)
Latest troponin level-1.94, electronically text to Dr. Oval Linsey. No order given.

## 2015-08-11 NOTE — Progress Notes (Signed)
ANTICOAGULATION CONSULT NOTE - Follow Up Consult  Pharmacy Consult for Heparin Indication: chest pain/ACS  Allergies  Allergen Reactions  . Plavix [Clopidogrel Bisulfate] Itching  . Codeine Other (See Comments)    "makes me feel strange"  . Tylenol With Codeine #3 [Acetaminophen-Codeine] Other (See Comments)    "doesn't make me feel right"  . Tylenol [Acetaminophen] Other (See Comments)    "Doesn't feel right"     Patient Measurements: Height: 5\' 4"  (162.6 cm) Weight: 145 lb 1 oz (65.8 kg) IBW/kg (Calculated) : 54.7 Heparin Dosing Weight:   Vital Signs: Temp: 98 F (36.7 C) (11/22 0800) Temp Source: Oral (11/22 0800) BP: 105/59 mmHg (11/22 0912) Pulse Rate: 87 (11/22 0908)  Labs:  Recent Labs  08/11/15 0040 08/11/15 0041 08/11/15 0423 08/11/15 0904  HGB  --  10.3* 9.6*  --   HCT  --  33.0* 30.8*  --   PLT  --  325 313  --   APTT 32  --   --   --   LABPROT 12.7  --  14.5  --   INR 0.93  --  1.11  --   HEPARINUNFRC  --   --   --  0.53  CREATININE  --  1.25* 1.32*  --   TROPONINI  --  0.04* 0.41* 1.94*    Estimated Creatinine Clearance: 41.2 mL/min (by C-G formula based on Cr of 1.32).  Assessment: 64 y.o. F presented to [facility] with CP. Found to have EKG changes and transferred to [facility]. Heparin started by MD at [facility]. Trop 0.04. CBC ok on admit.   PMH: HLD, CVA, HTN DM2, severe CAD (not candidate for CABG due to comorbidities and poor targets), syst HF, R AKA  Anticoagulation: Heparin for ACS. +enzymes. CBC ok on admit. Heparin level 0.53 in goal. Hgb 10.3>9.6.   Goal of Therapy:  Heparin level 0.3-0.7 units/ml Monitor platelets by anticoagulation protocol: Yes   Plan:  Continue heparin at 1000 units/hr Will f/u heparin level 6 hours post start Maximize medical management   Jermal Dismuke S. Alford Highland, PharmD, [facility] Clinical Staff Pharmacist Pager [phone]  Eilene Ghazi Stillinger 08/11/2015,11:05 AM

## 2015-08-11 NOTE — Progress Notes (Signed)
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  INTERVAL HISTORY: Jenna Brown was started on a nitroclycerin infusion.  Troponin increased to 0.41.    SUBJECTIVE:  Jenna Brown denies chest pain or shortness of breath.   PHYSICAL EXAM Filed Vitals:   08/11/15 0215 08/11/15 0319 08/11/15 0800 08/11/15 0908  BP: 119/66 103/60    Pulse: 98 99  87  Temp:  97.9 F (36.6 C) 98 F (36.7 C)   TempSrc:  Oral Oral   Resp: 20 22    Height:      Weight:      SpO2: 99% 99%     General:  Well-appearing.  NAD. Neck: No JVD Lungs:  CTAB.  No crackles, rhonchi or wheezes Heart:  RRR.  No m/r/g.  Nl S1/S2 Abdomen:  Soft, NT,ND.  +BS. Extremities:  WWP.  No edema.  LABS: Lab Results  Component Value Date   TROPONINI 0.41* 08/11/2015   Results for orders placed or performed during the hospital encounter of 08/11/15 (from the past 24 hour(s))  APTT     Status: None   Collection Time: 08/11/15 12:40 AM  Result Value Ref Range   aPTT 32 24 - 37 seconds  Protime-INR     Status: None   Collection Time: 08/11/15 12:40 AM  Result Value Ref Range   Prothrombin Time 12.7 11.6 - 15.2 seconds   INR 0.93 0.00 - 99991111  Basic metabolic panel     Status: Abnormal   Collection Time: 08/11/15 12:41 AM  Result Value Ref Range   Sodium 136 135 - 145 mmol/L   Potassium 4.3 3.5 - 5.1 mmol/L   Chloride 98 (L) 101 - 111 mmol/L   CO2 27 22 - 32 mmol/L   Glucose, Bld 201 (H) 65 - 99 mg/dL   BUN 36 (H) 6 - 20 mg/dL   Creatinine, Ser 1.25 (H) 0.44 - 1.00 mg/dL   Calcium 9.6 8.9 - 10.3 mg/dL   GFR calc non Af Amer 45 (L) >60 mL/min   GFR calc Af Amer 52 (L) >60 mL/min   Anion gap 11 5 - 15  CBC     Status: Abnormal   Collection Time: 08/11/15 12:41 AM  Result Value Ref Range   WBC 12.4 (H) 4.0 - 10.5 K/uL   RBC 4.12 3.87 - 5.11 MIL/uL   Hemoglobin 10.3 (L) 12.0  - 15.0 g/dL   HCT 33.0 (L) 36.0 - 46.0 %   MCV 80.1 78.0 - 100.0 fL   MCH 25.0 (L) 26.0 - 34.0 pg   MCHC 31.2 30.0 - 36.0 g/dL   RDW 13.7 11.5 - 15.5 %   Platelets 325 150 - 400 K/uL  Troponin I     Status: Abnormal   Collection Time: 08/11/15 12:41 AM  Result Value Ref Range   Troponin I 0.04 (H) <0.031 ng/mL  Differential     Status: Abnormal   Collection Time: 08/11/15 12:41 AM  Result Value Ref Range   Neutrophils Relative % 67 %   Neutro Abs 8.4 (H) 1.7 - 7.7 K/uL   Lymphocytes Relative 25 %   Lymphs Abs 3.1 0.7 - 4.0 K/uL   Monocytes Relative 7 %   Monocytes Absolute 0.8 0.1 - 1.0 K/uL   Eosinophils Relative 1 %   Eosinophils Absolute 0.1 0.0 -  0.7 K/uL   Basophils Relative 0 %   Basophils Absolute 0.0 0.0 - 0.1 K/uL  Digoxin level     Status: Abnormal   Collection Time: 08/11/15 12:46 AM  Result Value Ref Range   Digoxin Level 0.3 (L) 0.8 - 2.0 ng/mL  MRSA PCR Screening     Status: None   Collection Time: 08/11/15  2:16 AM  Result Value Ref Range   MRSA by PCR NEGATIVE NEGATIVE  Troponin I     Status: Abnormal   Collection Time: 08/11/15  4:23 AM  Result Value Ref Range   Troponin I 0.41 (H) <0.031 ng/mL  Basic metabolic panel     Status: Abnormal   Collection Time: 08/11/15  4:23 AM  Result Value Ref Range   Sodium 136 135 - 145 mmol/L   Potassium 4.7 3.5 - 5.1 mmol/L   Chloride 101 101 - 111 mmol/L   CO2 27 22 - 32 mmol/L   Glucose, Bld 235 (H) 65 - 99 mg/dL   BUN 32 (H) 6 - 20 mg/dL   Creatinine, Ser 1.32 (H) 0.44 - 1.00 mg/dL   Calcium 9.2 8.9 - 10.3 mg/dL   GFR calc non Af Amer 42 (L) >60 mL/min   GFR calc Af Amer 49 (L) >60 mL/min   Anion gap 8 5 - 15  CBC     Status: Abnormal   Collection Time: 08/11/15  4:23 AM  Result Value Ref Range   WBC 11.0 (H) 4.0 - 10.5 K/uL   RBC 3.85 (L) 3.87 - 5.11 MIL/uL   Hemoglobin 9.6 (L) 12.0 - 15.0 g/dL   HCT 30.8 (L) 36.0 - 46.0 %   MCV 80.0 78.0 - 100.0 fL   MCH 24.9 (L) 26.0 - 34.0 pg   MCHC 31.2 30.0 - 36.0  g/dL   RDW 13.6 11.5 - 15.5 %   Platelets 313 150 - 400 K/uL  Protime-INR     Status: None   Collection Time: 08/11/15  4:23 AM  Result Value Ref Range   Prothrombin Time 14.5 11.6 - 15.2 seconds   INR 1.11 0.00 - 1.49  Lipid panel     Status: Abnormal   Collection Time: 08/11/15  4:23 AM  Result Value Ref Range   Cholesterol 164 0 - 200 mg/dL   Triglycerides 94 <150 mg/dL   HDL 34 (L) >40 mg/dL   Total CHOL/HDL Ratio 4.8 RATIO   VLDL 19 0 - 40 mg/dL   LDL Cholesterol 111 (H) 0 - 99 mg/dL  Glucose, capillary     Status: Abnormal   Collection Time: 08/11/15  7:59 AM  Result Value Ref Range   Glucose-Capillary 130 (H) 65 - 99 mg/dL   Comment 1 Notify RN     Intake/Output Summary (Last 24 hours) at 08/11/15 0912 Last data filed at 08/11/15 0800  Gross per 24 hour  Intake   11.5 ml  Output      0 ml  Net   11.5 ml    EKG:  Sinus rhythm rate 86 bpm.  Inferior infarct.  Anterolateral TW inversions concerning for ischemia.  RHC/LHC 04/14/15  Prox LAD to Mid LAD lesion, 90% stenosed. Diffuse disease  Ost Cx to Mid Cx lesion, 95% stenosed.  Ost 2nd Mrg to 2nd Mrg lesion, 80% stenosed.  Mid Cx lesion, 90% stenosed.  Prox RCA lesion, 60% stenosed.  Mid RCA lesion, 50% stenosed.  TTE 07/27/15 - Left ventricle: The cavity size was normal. Wall thickness was increased  in a pattern of mild LVH. The estimated ejection fraction was 20%. Diffuse hypokinesis. Doppler parameters are consistent with abnormal left ventricular relaxation (grade 1 diastolic dysfunction). Abnormal GLS -11.9%. - Aortic valve: Trileaflet; moderately calcified leaflets. Sclerosis without stenosis. - Mitral valve: Mildly calcified annulus. There was trivial regurgitation. - Left atrium: The atrium was mildly dilated. - Right ventricle: The cavity size was normal. Systolic function was mildly reduced. - Pulmonary arteries: No complete TR doppler jet so unable to estimate PA systolic  pressure. - Systemic veins: IVC measured 2.1 cm with > 50% respirophasic variation, suggesting RA pressure 8 mmHg.  Impressions:  - Normal LV size with mild LV hypertrophy. EF 20%, diffuse hypokinesis. Normal RV size with mildly decreased systolic function. Aortic sclerosis without significant stenosis.  ASSESSMENT AND PLAN:  # Severe 3 vessel CAD not amenable to PCI/NSTEMI: I independently reviewed Jenna Brown cath films and recent echo.  She has severe 3 vessel disease an is not a good candidate for either PCI or CABG.  We will aim for maximizing her medical management.  She is currently chest pain free. - Stop nitro infusion and start Imdur 30 mg daily - Continue home carvedilol and losartan - She had pruritis with clopidogrel.  Will start ticagrelor 90 mg bid - Continue aspirin 81 mg daily - Continue rosuvastatin - Consider Ranexa if she continues to have chest discomfort  # Chronic systolic heart failure: Currently euvolemic. - Continue carvedilol., losartan, digoxin, lasix - Continues to refuse ICD  # Hypertension: BP well-controlled on carvedilol and losartan.  # Hyperlipidemia: Continue rosuvastatin.  She has been intolerant of higher doses.  # DM: Holding home metformin.  Continue lantus and switch lispro to aspart while in the hospital.  Check A1c.  Active Problems:   Cardiac ischemia   Chest pain    Sharol Harness, MD 08/11/2015 9:12 AM

## 2015-08-11 NOTE — ED Notes (Signed)
Patient states that she has a history of "chest spasms" and she usually takes flexeril for them. The patient is a Heart patient and is at a new facility, so the caregiver felt that she should be looked at to make sure she is ok.

## 2015-08-11 NOTE — Care Management Note (Signed)
Case Management Note  Patient Details  Name: VIVIANE GARATE MRN: [id number] Date of Birth: 1950/11/09  Subjective/Objective:       Adm w cardiac ischemia             Action/Plan: lives alone, pcp dr [name] [id number]   Expected Discharge Date:                  Expected Discharge Plan:     In-House Referral:     Discharge planning Services     Post Acute Care Choice:    Choice offered to:     DME Arranged:    DME Agency:     HH Arranged:    [place] Agency:     Status of Service:     Medicare Important Message Given:    Date Medicare IM Given:    Medicare IM give by:    Date Additional Medicare IM Given:    Additional Medicare Important Message give by:     If discussed at [place] of Stay Meetings, dates discussed:    Additional Comments: ur review done  Lacretia Leigh, RN 08/11/2015, 7:29 AM

## 2015-08-11 NOTE — H&P (Addendum)
Patient ID: Jenna Brown MRN: [id number], DOB/AGE: 62-May-1952   Admit date: 08/11/2015   Primary Physician: [name], MD Primary Cardiologist: [place]  Pt. Profile:  Jenna Brown is a 64 year old female with hyperlipidemia, CVA, hypertension, type 2 diabetes mellitus, severe CAD not a candidate for CABG due to comorbidities and poor targets, ICM with LVEF 20% (has refused ICD in past), and right AKA who presents with acute onset CP and ECG changes.   Problem List  Past Medical History  Diagnosis Date  . Hyperlipidemia     takes Simvastatin daily  . Peripheral vascular disease ([place])   . Stroke [facility])     TIA history  . PONV (postoperative nausea and vomiting)   . Hypertension     takes Amlodipine/HCTZ/Losartan daily  . Vertigo     takes Ativert prn  . Hemorrhoids   . Anxiety     takes Xanax prn  . Myocardial infarction ([place]) 1990's  . Type II diabetes mellitus ([facility])     takes Glucovance and Januvia daily  . H/O hiatal hernia   . GERD (gastroesophageal reflux disease)   . Headache(784.0)     when b/p is elevated  . Migraines   . Arthritis     Past Surgical History  Procedure Laterality Date  . Cleft palate repair      several  . Pr vein bypass graft,aorto-fem-pop  05/27/11    Right SFA-Below knee Pop BP  . Tonsillectomy    . Dilation and curettage of uterus    . Aortogram  02/2012  . Colonoscopy    . Femoral-tibial bypass graft  03/29/2012    Procedure: BYPASS GRAFT FEMORAL-TIBIAL ARTERY;  Surgeon: [name], MD;  Location: [facility] OR;  Service: Vascular;  Laterality: Right;  Right femoral to Posterior Tibialis with composite graft of 58m x 80 cm ringed gortex graft and saphenous vein  ,intraoperative arteriogram  . Femoral-popliteal bypass graft  04/05/2012    Procedure: BYPASS GRAFT FEMORAL-POPLITEAL ARTERY;  Surgeon: VSerafina Mitchell MD;  [place]  Service: Vascular;  Laterality: Right;  REVISION  . Myomectomy    . Tubal ligation  ~ 1990  .  Femoropopliteal thrombectomy / embolectomy  05/09/2012  . Amputation  05/10/2012    Procedure: AMPUTATION ABOVE KNEE;  Surgeon: VSerafina Mitchell MD;  [facility]  Service: Vascular;  Laterality: Right;   open right groin wound noted  . Abdominal aortagram N/A 02/29/2012    Procedure: ABDOMINAL AMaxcine Ham  Surgeon: VSerafina Mitchell MD;  [facility];  Service: Cardiovascular;  Laterality: N/A;  . Cardiac catheterization N/A 04/13/2015    Procedure: Right/Left Heart Cath and Coronary Angiography;  Surgeon: JLorretta Harp MD;  [facility];  Service: Cardiovascular;  Laterality: N/A;     Allergies  Allergies  Allergen Reactions  . Plavix [Clopidogrel Bisulfate] Itching  . Codeine Other (See Comments)    "makes me feel strange"  . Tylenol With Codeine #3 [Acetaminophen-Codeine] Other (See Comments)    "doesn't make me feel right"  . Tylenol [Acetaminophen] Other (See Comments)    "Doesn't feel right"     HPI  Jenna JPhillipsis a 64year old female with hyperlipidemia, CVA, hypertension, type 2 diabetes mellitus, severe CAD not a candidate for CABG due to comorbidities and poor targets, ICM with LVEF 20% (has refused ICD in past), and right AKA who presents with acute onset CP and ECG changes.   Jenna. JDevossdeveloped a left sided  chest "spasm" this evening with associated radiation down the left arm with numbness, nausea, and cough. She typically takes flexeril for these spasms and they go away. Typical episodes last 20-30 minutes; they can occur a few times a week or once to twice a month. No clear provoking factors. She recently moved in with her sister and was not sure where she had flexeril. Her sister had not seen one of these episodes before and was concerned. The sister added that there were a number of people at home for the holidays and she wonders if the patient just got excited. Aside from misplacing the flexeril, Jenna. Brown states she has taken all of her  medications as prescribed. These symptoms are not consistent with her symptoms before her MI over the summer.   On arrival to [facility], she was hypertensive (171/100) and tachycardic (106bpm), saturating 100% on RA. ECG on arrival demonstrated sinus tach with interior and lateral depressions and ~85m STE in aVR. Since 04/10/15, ST depressions became evident. Dr. JMartiniquewas called to discuss the ECG and did not feel the ECG met STEMI criteria but [facility] Labs were notable for K 4.4, Cr 1.41, WBC 12.4, hct 33, TnI 0.04. She was given 3215mPO aspirin and started on heparin and nitroglycerin drips [facility].   Home Medications  Prior to Admission medications   Medication Sig Start Date End Date Taking? Authorizing Provider  acetaminophen (TYLENOL) 650 MG CR tablet Take 650 mg by mouth every 8 (eight) hours as needed for pain.    Historical Provider, MD  ALPRAZolam (XDuanne Moron0.25 MG tablet Take 0.25 mg by mouth 3 (three) times daily as needed for anxiety.     Historical Provider, MD  aspirin 81 MG chewable tablet Chew 81 mg by mouth daily.    Historical Provider, MD  carvedilol (COREG) 3.125 MG tablet Take 3 tablets (9.375 mg total) by mouth 2 (two) times daily with a meal. 06/18/15   Amy D Clegg, NP  digoxin (LANOXIN) 0.125 MG tablet Take 1 tablet (0.125 mg total) by mouth daily. 05/14/15   Amy D ClNinfa MeekerNP  furosemide (LASIX) 40 MG tablet Take 1 tablet (40 mg total) by mouth daily. 05/14/15   Amy D Clegg, NP  insulin glargine (LANTUS) 100 UNIT/ML injection Inject 35 Units into the skin at bedtime.     Historical Provider, MD  insulin lispro (HUMALOG) 100 UNIT/ML injection Inject 5 Units into the skin 3 (three) times daily before meals.    Historical Provider, MD  Insulin Pen Needle (AURORA PEN NEEDLES) 29G X 12MM MISC 5 Units by Does not apply route 3 (three) times daily before meals. 05/16/15   MiRicard DillonMD  losartan (COZAAR) 50 MG tablet  Take 1 tablet (50 mg total) by mouth daily. 05/28/15   Amy D ClNinfa MeekerNP  meclizine (ANTIVERT) 25 MG tablet Take 25 mg by mouth daily as needed for dizziness.     Historical Provider, MD  metFORMIN (GLUCOPHAGE) 1000 MG tablet Take 1,000 mg by mouth 2 (two) times daily with a meal.    Historical Provider, MD  rosuvastatin (CRESTOR) 20 MG tablet Take 20 mg by mouth daily.    Historical Provider, MD    Family History  Family History  Problem Relation Age of Onset  . Cancer Mother     breast  . Diabetes Father     Social History  Social History   Social History  .  Marital Status: Single    Spouse Name: N/A  . Number of Children: N/A  . Years of Education: N/A   Occupational History  . Not on file.   Social History Main Topics  . Smoking status: Former Smoker -- 0.25 packs/day for 40 years    Types: Cigarettes    Quit date: 02/27/2012  . Smokeless tobacco: Never Used  . Alcohol Use: No  . Drug Use: No  . Sexual Activity: No   Other Topics Concern  . Not on file   Social History Narrative     Review of Systems General:  No chills, fever, night sweats or weight changes.  Cardiovascular: See HPI. No dyspnea on exertion, edema, orthopnea, palpitations, paroxysmal nocturnal dyspnea. Dermatological: No rash, lesions/masses Respiratory: No dyspnea. Sometimes has cough with these episodes.  Urologic: No hematuria, dysuria Abdominal:   + nausea. No vomiting, diarrhea, bright red blood per rectum, melena, or hematemesis Neurologic:  No visual changes, wkns, changes in mental status. All other systems reviewed and are otherwise negative except as noted above.  Physical Exam  Blood pressure 143/80, pulse 103, temperature 98.7 F (37.1 C), temperature source Oral, resp. rate 18, height '5\' 4"'  (1.626 m), weight 65.318 kg (144 lb), SpO2 100 %.  General: Pleasant, NAD Psych: Normal affect. Neuro: Alert and oriented.. Moves all extremities spontaneously. HEENT: Normal  Neck: Supple  without bruits or JVD. R neck scar from prior jaw surgery. Lungs:  Resp regular and unlabored, CTA. Heart: RRR no s3, s4, or 2/6 holosystolic murmur.  Abdomen: Soft, non-tender, non-distended, BS + x 4.  Extremities: No clubbing, cyanosis or edema. S/p R AKA. 2+ radial pulses.   Labs  Troponin (Point of Care Test) No results for input(s): TROPIPOC in the last 72 hours. No results for input(s): CKTOTAL, CKMB, TROPONINI in the last 72 hours. Lab Results  Component Value Date   WBC 12.4* 08/11/2015   HGB 10.3* 08/11/2015   HCT 33.0* 08/11/2015   MCV 80.1 08/11/2015   PLT 325 08/11/2015   No results for input(s): NA, K, CL, CO2, BUN, CREATININE, CALCIUM, PROT, BILITOT, ALKPHOS, ALT, AST, GLUCOSE in the last 168 hours.  Invalid input(s): LABALBU Lab Results  Component Value Date   CHOL 216* 04/12/2015   HDL 35* 04/12/2015   LDLCALC 153* 04/12/2015   TRIG 142 04/12/2015   Lab Results  Component Value Date   DDIMER 0.85* 04/10/2015     Radiology/Studies  No results found.   RHC/LHC 04/14/15  Prox LAD to Mid LAD lesion, 90% stenosed. Diffuse disease  Ost Cx to Mid Cx lesion, 95% stenosed.  Ost 2nd Mrg to 2nd Mrg lesion, 80% stenosed.  Mid Cx lesion, 90% stenosed.  Prox RCA lesion, 60% stenosed.  Mid RCA lesion, 50% stenosed.  TTE 07/27/15 - Left ventricle: The cavity size was normal. Wall thickness was increased in a pattern of mild LVH. The estimated ejection fraction was 20%. Diffuse hypokinesis. Doppler parameters are consistent with abnormal left ventricular relaxation (grade 1 diastolic dysfunction). Abnormal GLS -11.9%. - Aortic valve: Trileaflet; moderately calcified leaflets. Sclerosis without stenosis. - Mitral valve: Mildly calcified annulus. There was trivial regurgitation. - Left atrium: The atrium was mildly dilated. - Right ventricle: The cavity size was normal. Systolic function was mildly reduced. - Pulmonary arteries: No complete  TR doppler jet so unable to estimate PA systolic pressure. - Systemic veins: IVC measured 2.1 cm with > 50% respirophasic variation, suggesting RA pressure 8 mmHg.  Impressions:  - Normal LV  size with mild LV hypertrophy. EF 20%, diffuse hypokinesis. Normal RV size with mildly decreased systolic function. Aortic sclerosis without significant stenosis.  ECG  11-Aug-2015 00:38:38 Sinus tach with interior and lateral depressions and ~76m STE in aVR. Since 04/10/15,  ST depressions are evident.   ASSESSMENT AND PLAN  Jenna JKoopmannis a 64year old female with hyperlipidemia, CVA, hypertension, type 2 diabetes mellitus, severe CAD not a candidate for CABG due to comorbidities and poor targets, ICM with LVEF 20% (has refused ICD in past), and right AKA who presents with acute onset CP and ECG changes. There are typical and atypical aspects of the presenting symptoms. The presenting symptoms may represent an ACS. However, she presented to the ER quite hypertensive and, given the extent of her CAD, it would not be surprising to see ST depression with hypertension. The reason for being so hypertensive is not clear, but could be related to her being excited to see relatives. Her BP is well controlled now, albeit on a NTG gtt. She has few revascularization options based on known anatomy. Without a definite infarct, the benefits of repeating angiography are unclear.   1. CAD/CP.  - ASA - coreg 9.375 BID - UFH - IV NTG gtt; consider weaning in AM if troponins do not rise - ASA - cycle troponins - crestor 243m higher doses of statins have caused muscle aches - NPO for possible cath  2. Chronic Systolic Heart Failure. Euvolemic on exam.She has declined ICD in the past; today, she states she is interested. Her sister states that it is because she is happier now that she is living with family. - Continue lasix 40 mg daily.  -Continue losartan 50 mg daily, carvedilol 9.375 mg BID, digoxin 0.125  mg daily.  - consider ICD as outpatient  3. DM 2. Given possible cardiac cath, will make the following changes to home DM regimen: Decrease lantus from 35u to 30u qhs Hold lispro 5u AC given NPO status hold metformin given NPO status and potential for [facility]  Confirmed full code  Signed, FRLamar SprinklesMD 08/11/2015, 12:53 AM

## 2015-08-11 NOTE — Progress Notes (Signed)
ANTICOAGULATION CONSULT NOTE - Initial Consult  Pharmacy Consult for Heparin Indication: chest pain/ACS  Allergies  Allergen Reactions  . Plavix [Clopidogrel Bisulfate] Itching  . Codeine Other (See Comments)    "makes me feel strange"  . Tylenol With Codeine #3 [Acetaminophen-Codeine] Other (See Comments)    "doesn't make me feel right"  . Tylenol [Acetaminophen] Other (See Comments)    "Doesn't feel right"     Patient Measurements: Height: 5\' 4"  (162.6 cm) Weight: 145 lb 1 oz (65.8 kg) IBW/kg (Calculated) : 54.7 Heparin Dosing Weight: 66 kg  Vital Signs: Temp: 97.4 F (36.3 C) (11/22 0205) Temp Source: Oral (11/22 0205) BP: 119/66 mmHg (11/22 0215) Pulse Rate: 98 (11/22 0215)  Labs:  Recent Labs  08/11/15 0040 08/11/15 0041  HGB  --  10.3*  HCT  --  33.0*  PLT  --  325  APTT 32  --   LABPROT 12.7  --   INR 0.93  --   CREATININE  --  1.25*  TROPONINI  --  0.04*    Estimated Creatinine Clearance: 43.5 mL/min (by C-G formula based on Cr of 1.25).   Medical History: Past Medical History  Diagnosis Date  . Hyperlipidemia     takes Simvastatin daily  . Peripheral vascular disease ([place])   . Stroke [facility])     TIA history  . PONV (postoperative nausea and vomiting)   . Hypertension     takes Amlodipine/HCTZ/Losartan daily  . Vertigo     takes Ativert prn  . Hemorrhoids   . Anxiety     takes Xanax prn  . Myocardial infarction ([place]) 1990's  . Type II diabetes mellitus (HCC)     takes Glucovance and Januvia daily  . H/O hiatal hernia   . GERD (gastroesophageal reflux disease)   . Headache(784.0)     when b/p is elevated  . Migraines   . Arthritis     Medications:  Awaiting electronic med rec  Assessment: 64 y.o. F presented to [facility] with CP. Found to have EKG changes and transferred to [facility]. Heparin started by MD at [facility] ~ 0100 with 4000 units bolus and gtt at 1000 units/hr. Trop 0.04. CBC ok on admit. To continue heparin.  Goal of Therapy:   Heparin level 0.3-0.7 units/ml Monitor platelets by anticoagulation protocol: Yes   Plan:  Continue heparin at 1000 units/hr Will f/u heparin level 6 hours post start Daily heparin level and CBC  Sherlon Handing, PharmD, BCPS Clinical pharmacist, pager [phone] 08/11/2015,3:20 AM

## 2015-08-11 NOTE — ED Provider Notes (Signed)
CSN: [id number]     Arrival date & time 08/11/15  0025 History  By signing my name below, I, Jenna Brown, attest that this documentation has been prepared under the direction and in the presence of Shanon Rosser, MD. Electronically Signed: Eustaquio Brown, ED Scribe. 08/11/2015. 12:43 AM.  Chief Complaint  Patient presents with  . Chest Pain   The history is provided by the patient. No language interpreter was used.     HPI Comments: [name] is a 64 y.o. female with hx HLD, HTN, CAD, MI, DM who presents to the Emergency Department complaining of sudden onset, constant, mid chest discomfort radiating down left arm that began at 11 PM tonight (approximately 1.5 hours ago). Pt also complains of associated shortness of breath, nausea and she felt warm when the pain came on. She rates her pain as a 5 out of 10 presently but it was more severe earlier. There are no specific exacerbating or mitigating factors.  Past Medical History  Diagnosis Date  . Hyperlipidemia     takes Simvastatin daily  . Peripheral vascular disease ([place])   . Stroke [facility])     TIA history  . PONV (postoperative nausea and vomiting)   . Hypertension     takes Amlodipine/HCTZ/Losartan daily  . Vertigo     takes Ativert prn  . Hemorrhoids   . Anxiety     takes Xanax prn  . Myocardial infarction ([place]) 1990's  . Type II diabetes mellitus (HCC)     takes Glucovance and Januvia daily  . H/O hiatal hernia   . GERD (gastroesophageal reflux disease)   . Headache(784.0)     when b/p is elevated  . Migraines   . Arthritis    Past Surgical History  Procedure Laterality Date  . Cleft palate repair      several  . Pr vein bypass graft,aorto-fem-pop  05/27/11    Right SFA-Below knee Pop BP  . Tonsillectomy    . Dilation and curettage of uterus    . Aortogram  02/2012  . Colonoscopy    . Femoral-tibial bypass graft  03/29/2012    Procedure: BYPASS GRAFT FEMORAL-TIBIAL ARTERY;  Surgeon: [name], MD;   Location: [place] OR;  Service: Vascular;  Laterality: Right;  Right femoral to Posterior Tibialis with composite graft of 47mm x 80 cm ringed gortex graft and saphenous vein  ,intraoperative arteriogram  . Femoral-popliteal bypass graft  04/05/2012    Procedure: BYPASS GRAFT FEMORAL-POPLITEAL ARTERY;  Surgeon: [name], MD;  Location: [place];  Service: Vascular;  Laterality: Right;  REVISION  . Myomectomy    . Tubal ligation  ~ 1990  . Femoropopliteal thrombectomy / embolectomy  05/09/2012  . Amputation  05/10/2012    Procedure: AMPUTATION ABOVE KNEE;  Surgeon: [name], MD;  Location: [facility] OR;  Service: Vascular;  Laterality: Right;   open right groin wound noted  . Abdominal aortagram N/A 02/29/2012    Procedure: ABDOMINAL Maxcine Ham;  Surgeon: [name], MD;  Location: [facility] CATH LAB;  Service: Cardiovascular;  Laterality: N/A;  . Cardiac catheterization N/A 04/13/2015    Procedure: Right/Left Heart Cath and Coronary Angiography;  Surgeon: [name], MD;  Location: [place] CV LAB;  Service: Cardiovascular;  Laterality: N/A;   Family History  Problem Relation Age of Onset  . Cancer Mother     breast  . Diabetes Father    Social History  Substance Use Topics  . Smoking status: Former  Smoker -- 0.25 packs/day for 40 years    Types: Cigarettes    Quit date: 02/27/2012  . Smokeless tobacco: Never Used  . Alcohol Use: No   OB History    No data available     Review of Systems  A complete 10 system review of systems was obtained and all systems are negative except as noted in the HPI and PMH.    Allergies  Plavix; Codeine; Tylenol; and Tylenol with codeine #3  Home Medications   Prior to Admission medications   Medication Sig Start Date End Date Taking? Authorizing Provider  acetaminophen (TYLENOL) 650 MG CR tablet Take 650 mg by mouth every 8 (eight) hours as needed for pain.    Historical Provider, MD  ALPRAZolam Duanne Moron) 0.25 MG tablet Take 0.25 mg by mouth 3  (three) times daily as needed for anxiety.     Historical Provider, MD  aspirin 81 MG chewable tablet Chew 81 mg by mouth daily.    Historical Provider, MD  carvedilol (COREG) 3.125 MG tablet Take 3 tablets (9.375 mg total) by mouth 2 (two) times daily with a meal. 06/18/15   Amy D Clegg, NP  digoxin (LANOXIN) 0.125 MG tablet Take 1 tablet (0.125 mg total) by mouth daily. 05/14/15   Amy D Ninfa Meeker, NP  furosemide (LASIX) 40 MG tablet Take 1 tablet (40 mg total) by mouth daily. 05/14/15   Amy D Clegg, NP  insulin glargine (LANTUS) 100 UNIT/ML injection Inject 35 Units into the skin at bedtime.     Historical Provider, MD  insulin lispro (HUMALOG) 100 UNIT/ML injection Inject 5 Units into the skin 3 (three) times daily before meals.    Historical Provider, MD  Insulin Pen Needle (AURORA PEN NEEDLES) 29G X 12MM MISC 5 Units by Does not apply route 3 (three) times daily before meals. 05/16/15   Ricard Dillon, MD  losartan (COZAAR) 50 MG tablet Take 1 tablet (50 mg total) by mouth daily. 05/28/15   Amy D Ninfa Meeker, NP  meclizine (ANTIVERT) 25 MG tablet Take 25 mg by mouth daily as needed for dizziness.     Historical Provider, MD  metFORMIN (GLUCOPHAGE) 1000 MG tablet Take 1,000 mg by mouth 2 (two) times daily with a meal.    Historical Provider, MD  rosuvastatin (CRESTOR) 20 MG tablet Take 20 mg by mouth daily.    Historical Provider, MD   Triage Vitals: BP 171/100 mmHg  Pulse 106  Temp(Src) 98.7 F (37.1 C) (Oral)  Resp 16  Ht 5\' 4"  (1.626 m)  Wt 144 lb (65.318 kg)  BMI 24.71 kg/m2  SpO2 100%   Physical Exam  General: Well-developed, well-nourished female in no acute distress; appearance consistent with age of record HENT: normocephalic; atraumatic Eyes: pupils equal, round and reactive to light; extraocular muscles intact Neck: supple Heart: regular rate and rhythm; tachycardia Lungs: clear to auscultation bilaterally Abdomen: soft; nondistended; nontender; no masses or hepatosplenomegaly; bowel  sounds present Extremities: Right AKA; full range of motion; pulses normal Neurologic: Awake, alert and oriented; motor function intact in all extremities and symmetric; no facial droop Skin: Warm and dry Psychiatric: Normal mood and affect   ED Course  Procedures (including critical care time)  CRITICAL CARE Performed by: Brodan Grewell L Total critical care time: 30 minutes Critical care time was exclusive of separately billable procedures and treating other patients. Critical care was necessary to treat or prevent imminent or life-threatening deterioration. Critical care was time spent personally by me on the following  activities: development of treatment plan with patient and/or surrogate as well as nursing, discussions with consultants, evaluation of patient's response to treatment, examination of patient, obtaining history from patient or surrogate, ordering and performing treatments and interventions, ordering and review of laboratory studies, ordering and review of radiographic studies, pulse oximetry and re-evaluation of patient's condition.  MDM   Nursing notes and vitals signs, including pulse oximetry, reviewed.  Summary of this visit's results, reviewed by myself:   EKG Interpretation  Date/Time:  Tuesday August 11 2015 00:34:22 EST Ventricular Rate:  109 PR Interval:  140 QRS Duration: 88 QT Interval:  330 QTC Calculation: 444 R Axis:   59 Text Interpretation:  Sinus tachycardia Possible Anterior infarct , age undetermined Marked ST abnormality, possible inferolateral subendocardial injury Abnormal ECG ST depressions are new Confirmed by Mammie Meras  MD, Jenny Reichmann ([id number]) on 08/11/2015 12:51:45 AM       Labs:  Results for orders placed or performed during the hospital encounter of 08/11/15 (from the past 24 hour(s))  APTT     Status: None   Collection Time: 08/11/15 12:40 AM  Result Value Ref Range   aPTT 32 24 - 37 seconds  Protime-INR     Status: None   Collection  Time: 08/11/15 12:40 AM  Result Value Ref Range   Prothrombin Time 12.7 11.6 - 15.2 seconds   INR 0.93 0.00 - 99991111  Basic metabolic panel     Status: Abnormal   Collection Time: 08/11/15 12:41 AM  Result Value Ref Range   Sodium 136 135 - 145 mmol/L   Potassium 4.3 3.5 - 5.1 mmol/L   Chloride 98 (L) 101 - 111 mmol/L   CO2 27 22 - 32 mmol/L   Glucose, Bld 201 (H) 65 - 99 mg/dL   BUN 36 (H) 6 - 20 mg/dL   Creatinine, Ser 1.25 (H) 0.44 - 1.00 mg/dL   Calcium 9.6 8.9 - 10.3 mg/dL   GFR calc non Af Amer 45 (L) >60 mL/min   GFR calc Af Amer 52 (L) >60 mL/min   Anion gap 11 5 - 15  CBC     Status: Abnormal   Collection Time: 08/11/15 12:41 AM  Result Value Ref Range   WBC 12.4 (H) 4.0 - 10.5 K/uL   RBC 4.12 3.87 - 5.11 MIL/uL   Hemoglobin 10.3 (L) 12.0 - 15.0 g/dL   HCT 33.0 (L) 36.0 - 46.0 %   MCV 80.1 78.0 - 100.0 fL   MCH 25.0 (L) 26.0 - 34.0 pg   MCHC 31.2 30.0 - 36.0 g/dL   RDW 13.7 11.5 - 15.5 %   Platelets 325 150 - 400 K/uL  Troponin I     Status: Abnormal   Collection Time: 08/11/15 12:41 AM  Result Value Ref Range   Troponin I 0.04 (H) <0.031 ng/mL  Differential     Status: Abnormal   Collection Time: 08/11/15 12:41 AM  Result Value Ref Range   Neutrophils Relative % 67 %   Neutro Abs 8.4 (H) 1.7 - 7.7 K/uL   Lymphocytes Relative 25 %   Lymphs Abs 3.1 0.7 - 4.0 K/uL   Monocytes Relative 7 %   Monocytes Absolute 0.8 0.1 - 1.0 K/uL   Eosinophils Relative 1 %   Eosinophils Absolute 0.1 0.0 - 0.7 K/uL   Basophils Relative 0 %   Basophils Absolute 0.0 0.0 - 0.1 K/uL  Digoxin level     Status: Abnormal   Collection Time: 08/11/15 12:46 AM  Result  Value Ref Range   Digoxin Level 0.3 (L) 0.8 - 2.0 ng/mL    Imaging Studies: Dg Chest Port 1 View  08/11/2015  CLINICAL DATA:  Left-sided chest pain radiating down the left arm today. EXAM: PORTABLE CHEST 1 VIEW COMPARISON:  04/18/2015 FINDINGS: Shallow inspiration with elevation of the right hemidiaphragm. Normal heart  size and pulmonary vascularity. No focal airspace disease or consolidation in the lungs. No blunting of costophrenic angles. No pneumothorax. Mediastinal contours appear intact. Degenerative changes in the spine and shoulders. IMPRESSION: Shallow inspiration.  No evidence of active pulmonary disease. Electronically Signed   By: Lucienne Capers M.D.   On: 08/11/2015 01:16    1:11 AM [facility] here to transport patient to [facility]. Patient accepted by Dr. Tommi Rumps of cardiology. Discussed with Dr. [place] of STEMI call. Patient does not meet STEMI criteria at this time. Patient has been started on heparin. She is pain-free at this time without nitroglycerin.  Final diagnoses:  Cardiac ischemia   I personally performed the services described in this documentation, which was scribed in my presence. The recorded information has been reviewed and is accurate.     Shanon Rosser, MD 08/11/15 [phone]

## 2015-08-11 NOTE — ED Notes (Signed)
Report called to the floor.

## 2015-08-11 NOTE — ED Notes (Signed)
MD at bedside. 

## 2015-08-12 DIAGNOSIS — R072 Precordial pain: Secondary | ICD-10-CM

## 2015-08-12 LAB — CBC
HEMATOCRIT: 29.2 % — AB (ref 36.0–46.0)
HEMOGLOBIN: 9.3 g/dL — AB (ref 12.0–15.0)
MCH: 25.5 pg — AB (ref 26.0–34.0)
MCHC: 31.8 g/dL (ref 30.0–36.0)
MCV: 80 fL (ref 78.0–100.0)
Platelets: 292 10*3/uL (ref 150–400)
RBC: 3.65 MIL/uL — ABNORMAL LOW (ref 3.87–5.11)
RDW: 14 % (ref 11.5–15.5)
WBC: 8.7 10*3/uL (ref 4.0–10.5)

## 2015-08-12 LAB — GLUCOSE, CAPILLARY
GLUCOSE-CAPILLARY: 234 mg/dL — AB (ref 65–99)
GLUCOSE-CAPILLARY: 228 mg/dL — AB (ref 65–99)
Glucose-Capillary: 167 mg/dL — ABNORMAL HIGH (ref 65–99)
Glucose-Capillary: 196 mg/dL — ABNORMAL HIGH (ref 65–99)

## 2015-08-12 LAB — HEPARIN LEVEL (UNFRACTIONATED): Heparin Unfractionated: 0.44 [IU]/mL (ref 0.30–0.70)

## 2015-08-12 MED ORDER — INSULIN GLARGINE 100 UNIT/ML ~~LOC~~ SOLN
35.0000 [IU] | Freq: Every day | SUBCUTANEOUS | Status: DC
  Administered 2015-08-12: 35 [IU] via SUBCUTANEOUS
  Filled 2015-08-12 (×2): qty 0.35

## 2015-08-12 MED ORDER — INSULIN ASPART 100 UNIT/ML ~~LOC~~ SOLN
7.0000 [IU] | Freq: Three times a day (TID) | SUBCUTANEOUS | Status: DC
  Administered 2015-08-12 – 2015-08-13 (×4): 7 [IU] via SUBCUTANEOUS

## 2015-08-12 NOTE — Evaluation (Signed)
Physical Therapy Evaluation and Discharge Patient Details Name: Jenna Brown MRN: [id number] DOB: 06-25-1951 Today's Date: 08/12/2015   History of Present Illness  Jenna Brown is a 64 year old female with hyperlipidemia, CVA, hypertension, type 2 diabetes mellitus, severe CAD not a candidate for CABG due to comorbidities and poor targets, ICM with LVEF 20% (has refused ICD in past), and right AKA who presents with acute onset CP and ECG changes.   Clinical Impression  Pt functioning at baseline and reports she rarely walks at home and only completes std pvt transfers to/from w/c and BSC. Pt functioning at baseline and SpO2 >97% on RA during mobility. Pt with good home set up and support system available 24/7 at home. Pt safe to d/c home with family once medically stable. Pt with no further acute PT needs at this time. PT SIGNING OFF. Please re-consult if needed in future.    Follow Up Recommendations No PT follow up;Supervision - Intermittent    Equipment Recommendations  None recommended by PT    Recommendations for Other Services       Precautions / Restrictions Precautions Precautions: Other (comment) Precaution Comments: AKA Required Braces or Orthoses:  (has prosthesis but doesn't use it) Restrictions Weight Bearing Restrictions: No      Mobility  Bed Mobility Overal bed mobility: Modified Independent             General bed mobility comments: good technique  Transfers Overall transfer level: Modified independent Equipment used: None             General transfer comment: pt completed std pvt to/from chair without difficulty, demo'd safe technique  Ambulation/Gait                Stairs            Wheelchair Mobility    Modified Rankin (Stroke Patients Only)       Balance Overall balance assessment: Needs assistance Sitting-balance support: No upper extremity supported Sitting balance-Leahy Scale: Good     Standing balance support:  Bilateral upper extremity supported Standing balance-Leahy Scale: Poor Standing balance comment: due to R LE AKA pt requires ue support for safe standing                             Pertinent Vitals/Pain Pain Assessment: No/denies pain    Home Living Family/patient expects to be discharged to:: Private residence Living Arrangements: Spouse/significant other Available Help at Discharge: Family;Available 24 hours/day Type of Home: Apartment Home Access: Level entry     Home Layout: One level Home Equipment: Walker - 2 wheels;Bedside commode;Shower seat;Wheelchair - manual      Prior Function Level of Independence: Independent with assistive device(s)         Comments: pt uses w/c primarily and only amb with RW if she uses prosthesis however prefers RW. reports "i rarely ambulate b/c my leg is too heavy". w/c fits in the bathroom     Hand Dominance   Dominant Hand: Right    Extremity/Trunk Assessment   Upper Extremity Assessment: Overall WFL for tasks assessed           Lower Extremity Assessment: Overall WFL for tasks assessed (R AKA)      Cervical / Trunk Assessment: Normal  Communication   Communication: No difficulties  Cognition Arousal/Alertness: Awake/alert Behavior During Therapy: WFL for tasks assessed/performed Overall Cognitive Status: Within Functional Limits for tasks assessed  General Comments      Exercises        Assessment/Plan    PT Assessment Patent does not need any further PT services  PT Diagnosis Generalized weakness   PT Problem List    PT Treatment Interventions     PT Goals (Current goals can be found in the Care Plan section) Acute Rehab PT Goals Patient Stated Goal: go home PT Goal Formulation: All assessment and education complete, DC therapy    Frequency     Barriers to discharge        Co-evaluation               End of Session   Activity Tolerance: Patient  tolerated treatment well Patient left: in chair;with call bell/phone within reach Nurse Communication: Mobility status (SpO2 at 97% on RA)         Time: 1002-1016 PT Time Calculation (min) (ACUTE ONLY): 14 min   Charges:   PT Evaluation $Initial PT Evaluation Tier I: 1 Procedure     PT G CodesKingsley Callander 08/12/2015, 12:13 PM  Kittie Plater, PT, DPT Pager #: [phone] Office #: [phone]

## 2015-08-12 NOTE — Progress Notes (Signed)
Inpatient Diabetes Program Recommendations  AACE/ADA: New Consensus Statement on Inpatient Glycemic Control (2015)  Target Ranges:  Prepandial:   less than 140 mg/dL      Peak postprandial:   less than 180 mg/dL (1-2 hours)      Critically ill patients:  140 - 180 mg/dL  Results for Jenna Brown, Jenna Brown (MRN [id number]) as of 08/12/2015 08:24  Ref. Range 08/11/2015 00:41 08/11/2015 04:23  Glucose Latest Ref Range: 65-99 mg/dL 201 (H) 235 (H)   Results for Jenna Brown, Jenna Brown (MRN [id number]) as of 08/12/2015 08:24  Ref. Range 08/11/2015 11:46  Glucose-Capillary Latest Ref Range: 65-99 mg/dL 258 (H)  Review of Glycemic Control  Diabetes history: DM2 Outpatient Diabetes medications: Lantus 35 units QHS, Humalog 5 units TID with meals, Metformin 1000 mg BID Current orders for Inpatient glycemic control: Lantus 30 units QHS, Novolog 0-15 units TID with meals, Novolog 0-5 units HS, Novolog 5 units TID with meals for meal coverage  Inpatient Diabetes Program Recommendations: Insulin - Basal: Please consider increasing Lantus to 32 units QHS. Insulin - Meal Coverage: Please consider increasing meal coverage to Novolog 8 units TID with meals if patient eats at least 50% of meal.   Thanks, Barnie Alderman, RN, MSN, CDE Diabetes Coordinator Inpatient Diabetes Program [phone] (Team Pager from [place] to [place]) [phone] ([facility] office) [phone] [facility] office) [phone] [facility] office)

## 2015-08-12 NOTE — Progress Notes (Signed)
PATIENT ID: Jenna Brown is a 73F with severe 3 vessel CAD not amenable to cath or PCI, chronic systolic heart failure EF 20%, CVA, hypertension, DM type 2, and R AKA here chest pain and NSTEMI.  INTERVAL HISTORY: Nitroglycerin infusion was switched to Imdur.  Troponin peaked at 1.94.  SUBJECTIVE:  Ms. Velez denies chest pain or shortness of breath.   PHYSICAL EXAM Filed Vitals:   08/12/15 0032 08/12/15 0400 08/12/15 0600 08/12/15 0755  BP: 91/40 90/52 107/58 108/49  Pulse: 81 73 74 73  Temp:  98.6 F (37 C)  98 F (36.7 C)  TempSrc:  Oral  Oral  Resp: 19 11 22 22   Height:      Weight:      SpO2: 100% 100% 100% 100%   General:  Well-appearing.  NAD. Neck: No JVD Lungs:  CTAB.  No crackles, rhonchi or wheezes Heart:  RRR.  No m/r/g.  Nl S1/S2 Abdomen:  Soft, NT,ND.  +BS. Extremities:  WWP.  No edema.  R AKA  LABS: Lab Results  Component Value Date   TROPONINI 1.91* 08/11/2015   Results for orders placed or performed during the hospital encounter of 08/11/15 (from the past 24 hour(s))  Troponin I     Status: Abnormal   Collection Time: 08/11/15  9:04 AM  Result Value Ref Range   Troponin I 1.94 (HH) <0.031 ng/mL  Heparin level (unfractionated)     Status: None   Collection Time: 08/11/15  9:04 AM  Result Value Ref Range   Heparin Unfractionated 0.53 0.30 - 0.70 IU/mL  Glucose, capillary     Status: Abnormal   Collection Time: 08/11/15 11:46 AM  Result Value Ref Range   Glucose-Capillary 258 (H) 65 - 99 mg/dL  Troponin I     Status: Abnormal   Collection Time: 08/11/15  2:20 PM  Result Value Ref Range   Troponin I 1.91 (HH) <0.031 ng/mL  Heparin level (unfractionated)     Status: None   Collection Time: 08/12/15  3:52 AM  Result Value Ref Range   Heparin Unfractionated 0.44 0.30 - 0.70 IU/mL  CBC     Status: Abnormal   Collection Time: 08/12/15  3:53 AM  Result Value Ref Range   WBC 8.7 4.0 - 10.5 K/uL   RBC 3.65 (L) 3.87 - 5.11 MIL/uL   Hemoglobin 9.3 (L)  12.0 - 15.0 g/dL   HCT 29.2 (L) 36.0 - 46.0 %   MCV 80.0 78.0 - 100.0 fL   MCH 25.5 (L) 26.0 - 34.0 pg   MCHC 31.8 30.0 - 36.0 g/dL   RDW 14.0 11.5 - 15.5 %   Platelets 292 150 - 400 K/uL    Intake/Output Summary (Last 24 hours) at 08/12/15 0800 Last data filed at 08/12/15 0600  Gross per 24 hour  Intake 1271.5 ml  Output     50 ml  Net 1221.5 ml    EKG:  Sinus rhythm rate 86 bpm.  Inferior infarct.  Anterolateral TW inversions concerning for ischemia.  RHC/LHC 04/14/15  Prox LAD to Mid LAD lesion, 90% stenosed. Diffuse disease  Ost Cx to Mid Cx lesion, 95% stenosed.  Ost 2nd Mrg to 2nd Mrg lesion, 80% stenosed.  Mid Cx lesion, 90% stenosed.  Prox RCA lesion, 60% stenosed.  Mid RCA lesion, 50% stenosed.  TTE 07/27/15 - Left ventricle: The cavity size was normal. Wall thickness was increased in a pattern of mild LVH. The estimated ejection fraction was 20%. Diffuse hypokinesis. Doppler  parameters are consistent with abnormal left ventricular relaxation (grade 1 diastolic dysfunction). Abnormal GLS -11.9%. - Aortic valve: Trileaflet; moderately calcified leaflets. Sclerosis without stenosis. - Mitral valve: Mildly calcified annulus. There was trivial regurgitation. - Left atrium: The atrium was mildly dilated. - Right ventricle: The cavity size was normal. Systolic function was mildly reduced. - Pulmonary arteries: No complete TR doppler jet so unable to estimate PA systolic pressure. - Systemic veins: IVC measured 2.1 cm with > 50% respirophasic variation, suggesting RA pressure 8 mmHg.  Impressions:  - Normal LV size with mild LV hypertrophy. EF 20%, diffuse hypokinesis. Normal RV size with mildly decreased systolic function. Aortic sclerosis without significant stenosis.  ASSESSMENT AND PLAN:  # Severe 3 vessel CAD not amenable to PCI/NSTEMI: I independently reviewed Ms. Peplinski cath films and recent echo.  She has severe 3 vessel  disease an is not a good candidate for either PCI or CABG.  We will aim for maximizing her medical management.  Troponin peaked at 1.94.  She is currently chest pain free. - Started Imdur 30 mg daily this admission - Continue home carvedilol and losartan - She had pruritis with clopidogrel.  Started ticagrelor 90 mg bid this admission - Continue aspirin 81 mg daily - Continue rosuvastatin - Consider Ranexa if she continues to have chest discomfort  # Chronic systolic heart failure: Currently euvolemic. - Continue carvedilol., losartan, digoxin, lasix - Continues to refuse ICD - LVEF assessed less than 1 month ago.  No utility in repeat TTE.  # Hypertension: BP well-controlled on carvedilol and losartan.  # Hyperlipidemia: Continue rosuvastatin.  She has been intolerant of higher doses.  # DM: Holding home metformin.  Continue lantus and switch lispro to aspart while in the hospital.  Check A1c. - Increase aspart to 7 units AC while in the hospital and metformin is being held - Increase lantus to 35 units HS while in the hospital and metformin is being held  Active Problems:   Cardiac ischemia   Chest pain   CAD in native artery    Sharol Harness, MD 08/12/2015 8:00 AM

## 2015-08-12 NOTE — Progress Notes (Signed)
ANTICOAGULATION CONSULT NOTE - Follow Up Consult  Pharmacy Consult for Heparin Indication: chest pain/ACS  Allergies  Allergen Reactions  . Plavix [Clopidogrel Bisulfate] Itching  . Codeine Other (See Comments)    "makes me feel strange"  . Tylenol With Codeine #3 [Acetaminophen-Codeine] Other (See Comments)    "doesn't make me feel right"  . Tylenol [Acetaminophen] Other (See Comments)    "Doesn't feel right"     Patient Measurements: Height: 5\' 4"  (162.6 cm) Weight: 145 lb 1 oz (65.8 kg) IBW/kg (Calculated) : 54.7 Heparin Dosing Weight:   Vital Signs: Temp: 98 F (36.7 C) (11/23 0755) Temp Source: Oral (11/23 0755) BP: 108/49 mmHg (11/23 0755) Pulse Rate: 73 (11/23 0755)  Labs:  Recent Labs  08/11/15 0040  08/11/15 0041 08/11/15 0423 08/11/15 0904 08/11/15 1420 08/12/15 0352 08/12/15 0353  HGB  --   < > 10.3* 9.6*  --   --   --  9.3*  HCT  --   --  33.0* 30.8*  --   --   --  29.2*  PLT  --   --  325 313  --   --   --  292  APTT 32  --   --   --   --   --   --   --   LABPROT 12.7  --   --  14.5  --   --   --   --   INR 0.93  --   --  1.11  --   --   --   --   HEPARINUNFRC  --   --   --   --  0.53  --  0.44  --   CREATININE  --   --  1.25* 1.32*  --   --   --   --   TROPONINI  --   < > 0.04* 0.41* 1.94* 1.91*  --   --   < > = values in this interval not displayed.  Estimated Creatinine Clearance: 41.2 mL/min (by C-G formula based on Cr of 1.32).  Assessment: 64 y.o. F presented to [facility] with CP. Found to have EKG changes and transferred to [facility]. Heparin started by MD at [facility]. Trop 0.04. CBC ok on admit.   PMH: HLD, CVA, HTN DM2, severe CAD (not candidate for CABG due to comorbidities and poor targets), syst HF, R AKA  Anticoagulation: Heparin for ACS. +enzymes. CBC ok on admit. Heparin level 0.44 in goal. Hgb 10.3>9.3.   Goal of Therapy:  Heparin level 0.3-0.7 units/ml Monitor platelets by anticoagulation protocol: Yes   Plan:  Continue heparin at 1000  units/hr Daily HL and CBC Maximize medical management   Brennen Camper S. Alford Highland, PharmD, [facility] Clinical Staff Pharmacist Pager [phone]  [place], [place] 08/12/2015,8:40 AM

## 2015-08-13 DIAGNOSIS — I5022 Chronic systolic (congestive) heart failure: Secondary | ICD-10-CM

## 2015-08-13 LAB — URINALYSIS, ROUTINE W REFLEX MICROSCOPIC
Bilirubin Urine: NEGATIVE
Glucose, UA: NEGATIVE mg/dL
Ketones, ur: NEGATIVE mg/dL
Leukocytes, UA: NEGATIVE
NITRITE: NEGATIVE
PH: 5.5 (ref 5.0–8.0)
Protein, ur: 100 mg/dL — AB
SPECIFIC GRAVITY, URINE: 1.016 (ref 1.005–1.030)

## 2015-08-13 LAB — CBC
HEMATOCRIT: 28.8 % — AB (ref 36.0–46.0)
HEMOGLOBIN: 9 g/dL — AB (ref 12.0–15.0)
MCH: 25.2 pg — AB (ref 26.0–34.0)
MCHC: 31.3 g/dL (ref 30.0–36.0)
MCV: 80.7 fL (ref 78.0–100.0)
Platelets: 281 10*3/uL (ref 150–400)
RBC: 3.57 MIL/uL — AB (ref 3.87–5.11)
RDW: 13.8 % (ref 11.5–15.5)
WBC: 11.7 10*3/uL — ABNORMAL HIGH (ref 4.0–10.5)

## 2015-08-13 LAB — URINE MICROSCOPIC-ADD ON: RBC / HPF: NONE SEEN RBC/hpf (ref 0–5)

## 2015-08-13 LAB — GLUCOSE, CAPILLARY
GLUCOSE-CAPILLARY: 221 mg/dL — AB (ref 65–99)
GLUCOSE-CAPILLARY: 297 mg/dL — AB (ref 65–99)

## 2015-08-13 LAB — HEMOGLOBIN A1C
HEMOGLOBIN A1C: 8.1 % — AB (ref 4.8–5.6)
Mean Plasma Glucose: 186 mg/dL

## 2015-08-13 LAB — HEPARIN LEVEL (UNFRACTIONATED): Heparin Unfractionated: 0.41 IU/mL (ref 0.30–0.70)

## 2015-08-13 MED ORDER — TICAGRELOR 90 MG PO TABS: 90.0000 mg | ORAL_TABLET | Freq: Two times a day (BID) | ORAL | Status: DC

## 2015-08-13 MED ORDER — NITROGLYCERIN 0.4 MG SL SUBL: 0.4000 mg | SUBLINGUAL_TABLET | SUBLINGUAL | Status: DC | PRN

## 2015-08-13 MED ORDER — ISOSORBIDE MONONITRATE ER 30 MG PO TB24: 30.0000 mg | ORAL_TABLET | Freq: Every day | ORAL | Status: DC

## 2015-08-13 NOTE — Discharge Instructions (Signed)
***  PLEASE REMEMBER TO BRING ALL OF YOUR MEDICATIONS TO EACH OF YOUR FOLLOW-UP OFFICE VISITS.  

## 2015-08-13 NOTE — Progress Notes (Signed)
PATIENT ID: Jenna Brown is a 53F with severe 3 vessel CAD not amenable to cath or PCI, chronic systolic heart failure EF 20%, CVA, hypertension, DM type 2, and R AKA here chest pain and NSTEMI.  INTERVAL HISTORY: Stopped heparin drip.  No events.  SUBJECTIVE:  Jenna Brown denies chest pain or shortness of breath.  She did well with PT yesterday.   PHYSICAL EXAM Filed Vitals:   08/12/15 2008 08/12/15 2335 08/13/15 0405 08/13/15 0820  BP: 104/58 103/49 115/55 121/52  Pulse: 78 72 77 77  Temp: 98.3 F (36.8 C) 98.1 F (36.7 C) 97.8 F (36.6 C) 98.6 F (37 C)  TempSrc: Oral Oral Oral Oral  Resp: 21 18 19 22   Height:      Weight:      SpO2: 98% 98% 96% 96%   General:  Well-appearing.  NAD. Neck: No JVD Lungs:  CTAB.  No crackles, rhonchi or wheezes Heart:  RRR.  No m/r/g.  Nl S1/S2 Abdomen:  Soft, NT,ND.  +BS. Extremities:  WWP.  No edema.  R AKA  LABS: Lab Results  Component Value Date   TROPONINI 1.91* 08/11/2015   Results for orders placed or performed during the hospital encounter of 08/11/15 (from the past 24 hour(s))  Glucose, capillary     Status: Abnormal   Collection Time: 08/12/15 11:40 AM  Result Value Ref Range   Glucose-Capillary 196 (H) 65 - 99 mg/dL   Comment 1 Capillary Specimen   Glucose, capillary     Status: Abnormal   Collection Time: 08/12/15  4:35 PM  Result Value Ref Range   Glucose-Capillary 234 (H) 65 - 99 mg/dL   Comment 1 Capillary Specimen   Glucose, capillary     Status: Abnormal   Collection Time: 08/12/15  9:34 PM  Result Value Ref Range   Glucose-Capillary 228 (H) 65 - 99 mg/dL   Comment 1 Capillary Specimen   Heparin level (unfractionated)     Status: None   Collection Time: 08/13/15  2:18 AM  Result Value Ref Range   Heparin Unfractionated 0.41 0.30 - 0.70 IU/mL  CBC     Status: Abnormal   Collection Time: 08/13/15  2:18 AM  Result Value Ref Range   WBC 11.7 (H) 4.0 - 10.5 K/uL   RBC 3.57 (L) 3.87 - 5.11 MIL/uL   Hemoglobin 9.0  (L) 12.0 - 15.0 g/dL   HCT 28.8 (L) 36.0 - 46.0 %   MCV 80.7 78.0 - 100.0 fL   MCH 25.2 (L) 26.0 - 34.0 pg   MCHC 31.3 30.0 - 36.0 g/dL   RDW 13.8 11.5 - 15.5 %   Platelets 281 150 - 400 K/uL    Intake/Output Summary (Last 24 hours) at 08/13/15 0829 Last data filed at 08/12/15 2200  Gross per 24 hour  Intake    410 ml  Output   1300 ml  Net   -890 ml    EKG:  Sinus rhythm rate 86 bpm.  Inferior infarct.  Anterolateral TW inversions concerning for ischemia.  RHC/LHC 04/14/15  Prox LAD to Mid LAD lesion, 90% stenosed. Diffuse disease  Ost Cx to Mid Cx lesion, 95% stenosed.  Ost 2nd Mrg to 2nd Mrg lesion, 80% stenosed.  Mid Cx lesion, 90% stenosed.  Prox RCA lesion, 60% stenosed.  Mid RCA lesion, 50% stenosed.  TTE 07/27/15 - Left ventricle: The cavity size was normal. Wall thickness was increased in a pattern of mild LVH. The estimated ejection fraction was 20%.  Diffuse hypokinesis. Doppler parameters are consistent with abnormal left ventricular relaxation (grade 1 diastolic dysfunction). Abnormal GLS -11.9%. - Aortic valve: Trileaflet; moderately calcified leaflets. Sclerosis without stenosis. - Mitral valve: Mildly calcified annulus. There was trivial regurgitation. - Left atrium: The atrium was mildly dilated. - Right ventricle: The cavity size was normal. Systolic function was mildly reduced. - Pulmonary arteries: No complete TR doppler jet so unable to estimate PA systolic pressure. - Systemic veins: IVC measured 2.1 cm with > 50% respirophasic variation, suggesting RA pressure 8 mmHg.  Impressions:  - Normal LV size with mild LV hypertrophy. EF 20%, diffuse hypokinesis. Normal RV size with mildly decreased systolic function. Aortic sclerosis without significant stenosis.  ASSESSMENT AND PLAN:  # Severe 3 vessel CAD not amenable to PCI/NSTEMI: I independently reviewed Jenna Brown cath films and recent echo.  She has severe 3 vessel  disease an is not a good candidate for either PCI or CABG.  We will aim for maximizing her medical management.  Troponin peaked at 1.94.  She is currently chest pain free.  She had heparin for 48H and remains chest pain free.  She worked with PT yesterday without chest discomfort. - Started Imdur 30 mg daily this admission - Continue home carvedilol and losartan - She had pruritis with clopidogrel.  Started ticagrelor 90 mg bid this admission - Continue aspirin 81 mg daily - Continue rosuvastatin - Consider Ranexa if she continues to have chest discomfort  # Chronic systolic heart failure: Currently euvolemic. - Continue carvedilol., losartan, digoxin, lasix - Continues to refuse ICD - LVEF assessed less than 1 month ago.  No utility in repeat TTE. - Follow up with Dr. Haroldine Laws.  # Hypertension: BP well-controlled on carvedilol and losartan.  # Hyperlipidemia: Continue rosuvastatin.  She has been intolerant of higher doses.  # DM: Holding home metformin.  Continue lantus and switch lispro to aspart while in the hospital.  Check A1c. - Increase aspart to 7 units AC while in the hospital and metformin is being held - Increase lantus to 35 units HS while in the hospital and metformin is being held  # Leukocytosis: WBC increased to 11.7 this am.  She denies dysuria.  She has a mild, non-productive cough but no fever or chills and lungs are clear on exam. - Check u/a  Active Problems:   Cardiac ischemia   Chest pain   CAD in native artery    Sharol Harness, MD 08/13/2015 8:29 AM

## 2015-08-13 NOTE — Discharge Summary (Signed)
Discharge Summary   Patient ID: Jenna Brown,  MRN: [id number], DOB/AGE: Apr 28, 1951 64 y.o.  Admit date: 08/11/2015 Discharge date: 08/13/2015  Primary Care Provider: [name],GEORGE Primary Cardiologist: D. Bensimhon, MD   Discharge Diagnoses    Principal Problem:   NSTEMI (non-ST elevated myocardial infarction) ([place])  **Medically managed - imdur added. Active Problems:   CAD in native artery   DMII (diabetes mellitus, type 2) (HCC)   Essential hypertension   Chronic systolic heart failure ([place]).  **Discharge weight 145 lbs.   H/O Stroke [facility])   Hyperlipidemia   Anemia   Leukocytosis   PAD (peripheral artery disease) (H[facility])  Allergies Allergies  Allergen Reactions  . Plavix [Clopidogrel Bisulfate] Itching  . Codeine Other (See Comments)    "makes me feel strange"  . Tylenol With Codeine #3 [Acetaminophen-Codeine] Other (See Comments)    "doesn't make me feel right"  . Tylenol [Acetaminophen] Other (See Comments)    "Doesn't feel right"     Diagnostic Studies/Procedures    None _____________   History of Present Illness  64 y/o female with a history of severe, multivessel CAD s/p catheterization in July 2016. She is not felt to have any targets amenable to PCI and further is not felt to be a candidate for CABG.  She also has a h/o ICM (EF of 20%),  PAD s/p R AKA, CVA, HTN, and Diabetes.  She was in her usual state of health until the evening of 11/22 when she developed chest pain and presented to the [place] [facility] where she was hypertensive and tachycardic with normal O2 saturations.  ECG was notable for inferior and lateral ST depressions, which were new.  ECG was reviewed by interventional team at [facility] and was not felt to meet STEMI criteria.  Pt was transferred to [facility] however, for further cardiac evaluation.  Hospital Course  Consultants: None    Patient ruled in for NSTEMI, eventually peaking her troponin at 1.94.  Given h/o known  severe multivessel disease and also CKD, it was not felt that invasive evaluation was prudent.  She was maintained on asa, heparin, bb, arb, statin, and nitrate therapy.  As she has a prior h/o pruritis with plavix, we opted to initiate brilinta for medical therapy of ACS.  IV nitrates were converted to oral nitrates and heparin was subsequently discontinued.  She has had no further chest pain.  She has also remained euvolemic throughout admission.  Of note, she has been noted to have mild leukocytosis.  Despite this, she has been afebrile and CXR and UA were unrevealing.  Jenna Brown will be discharged today in good condition.  We will arrange for f/u in CHF clinic in 1-2 wks. _____________  Discharge Vitals Blood pressure 121/52, pulse 77, temperature 98.6 F (37 C), temperature source Oral, resp. rate 22, height 5\' 4"  (1.626 m), weight 145 lb 1 oz (65.8 kg), SpO2 96 %.  Filed Weights   08/11/15 0038 08/11/15 0205  Weight: 144 lb (65.318 kg) 145 lb 1 oz (65.8 kg)    Labs     CBC  Recent Labs  08/11/15 0041  08/12/15 0353 08/13/15 0218  WBC 12.4*  < > 8.7 11.7*  NEUTROABS 8.4*  --   --   --   HGB 10.3*  < > 9.3* 9.0*  HCT 33.0*  < > 29.2* 28.8*  MCV 80.1  < > 80.0 80.7  PLT 325  < > 292 281  < > =  values in this interval not displayed. Basic Metabolic Panel  Recent Labs  08/11/15 0041 08/11/15 0423  NA 136 136  K 4.3 4.7  CL 98* 101  CO2 27 27  GLUCOSE 201* 235*  BUN 36* 32*  CREATININE 1.25* 1.32*  CALCIUM 9.6 9.2   Cardiac Enzymes  Recent Labs  08/11/15 0423 08/11/15 0904 08/11/15 1420  TROPONINI 0.41* 1.94* 1.91*   Hemoglobin A1C  Recent Labs  08/12/15 0353  HGBA1C 8.1*   Fasting Lipid Panel  Recent Labs  08/11/15 0423  CHOL 164  HDL 34*  LDLCALC 111*  TRIG 94  CHOLHDL 4.8    Disposition   Pt is being discharged home today in good condition.  Follow-up Plans & Appointments    Follow-up Information    Follow up with Bensimhon, Quillian Quince,  MD In 1 week.   Specialty:  Cardiology   Why:  we will arrange for f/u and contact you.   Contact information:   [place] [address] [phone]       Follow up with [name], MD.   Specialty:  Internal Medicine   Why:  as scheduled.   Contact information:   [place] [address] [phone]        Discharge Medications   Current Discharge Medication List    START taking these medications   Details  isosorbide mononitrate (IMDUR) 30 MG 24 hr tablet Take 1 tablet (30 mg total) by mouth daily. Qty: 30 tablet, Refills: 6    nitroGLYCERIN (NITROSTAT) 0.4 MG SL tablet Place 1 tablet (0.4 mg total) under the tongue every 5 (five) minutes x 3 doses as needed for chest pain. Qty: 25 tablet, Refills: 3    ticagrelor (BRILINTA) 90 MG TABS tablet Take 1 tablet (90 mg total) by mouth 2 (two) times daily. Qty: 60 tablet, Refills: 6      CONTINUE these medications which have NOT CHANGED   Details  acetaminophen (TYLENOL) 650 MG CR tablet Take 650 mg by mouth every 8 (eight) hours as needed for pain.    ALPRAZolam (XANAX) 0.25 MG tablet Take 0.25 mg by mouth 3 (three) times daily as needed for anxiety.     aspirin 81 MG chewable tablet Chew 81 mg by mouth daily.    carvedilol (COREG) 3.125 MG tablet Take 3 tablets (9.375 mg total) by mouth 2 (two) times daily with a meal. Qty: 90 tablet, Refills: 3    digoxin (LANOXIN) 0.125 MG tablet Take 1 tablet (0.125 mg total) by mouth daily. Qty: 90 tablet, Refills: 3    furosemide (LASIX) 40 MG tablet Take 1 tablet (40 mg total) by mouth daily. Qty: 90 tablet, Refills: 3    insulin glargine (LANTUS) 100 UNIT/ML injection Inject 35 Units into the skin at bedtime.     insulin lispro (HUMALOG) 100 UNIT/ML injection Inject 5 Units into the skin 3 (three) times daily before meals.    losartan (COZAAR) 50 MG tablet Take 1 tablet (50 mg total) by mouth daily.    meclizine  (ANTIVERT) 25 MG tablet Take 25 mg by mouth daily as needed for dizziness.     metFORMIN (GLUCOPHAGE) 1000 MG tablet Take 1,000 mg by mouth 2 (two) times daily with a meal.    rosuvastatin (CRESTOR) 20 MG tablet Take 20 mg by mouth daily.    Insulin Pen Needle (AURORA PEN NEEDLES) 29G X 12MM MISC 5 Units by Does not apply route 3 (three) times  daily before meals. Qty: 100 each, Refills: 0         Aspirin prescribed at discharge:  Yes High Intensity Statin Prescribed? (Lipitor 40-80mg  or Crestor 20-40mg ): Yes Beta Blocker Prescribed: Yes ADP Receptor Inhibitor Prescribed? (i.e. Plavix etc.-Includes Medically Managed Patients): Yes For EF <40%, Aldosterone Inhibitor Prescribed? No: CKD with h/o Hyperkalemia. Was EF assessed during THIS hospitalization? No: recent evaluation as outpt. Was Cardiac Rehab II ordered? (Included Medically managed Patients): Yes   Outstanding Labs/Studies   None  Duration of Discharge Encounter   Greater than 30 minutes including physician time.  Signed, Murray Hodgkins NP 08/13/2015, 10:35 AM

## 2015-08-13 NOTE — Progress Notes (Signed)
D/c teaching completed with patient using the teachback method. Pt verbalized and taught back all information given including new medications and heart failure teaching. IVs removed. Pt taken to patient pickup via WC and assisted into the vehicle.

## 2015-08-19 DIAGNOSIS — R0989 Other specified symptoms and signs involving the circulatory and respiratory systems: Secondary | ICD-10-CM | POA: Diagnosis not present

## 2015-08-19 DIAGNOSIS — R200 Anesthesia of skin: Secondary | ICD-10-CM | POA: Diagnosis not present

## 2015-08-19 DIAGNOSIS — Z87891 Personal history of nicotine dependence: Secondary | ICD-10-CM | POA: Diagnosis not present

## 2015-08-21 ENCOUNTER — Telehealth (HOSPITAL_COMMUNITY): Payer: Self-pay | Admitting: *Deleted

## 2015-08-21 NOTE — Telephone Encounter (Signed)
I've called the numbers listed for pt as well as emergency contacts to schedule her appt since Monday still no answer or returned calls

## 2015-08-21 NOTE — Telephone Encounter (Signed)
From recent notes it looks like she is in [facility].  Please try to call there, unless that number was already included in contact information.

## 2015-08-21 NOTE — Telephone Encounter (Signed)
i contacted [facility] she is not a pt there

## 2015-08-21 NOTE — Telephone Encounter (Signed)
-----   Message from Scarlette Calico, RN sent at 08/21/2015 12:16 PM EST ----- Marykay Lex can you get this pt in to see Amy for post hosp next week or the week after please, thanks ----- Message -----    From: Rogelia Mire, NP    Sent: 08/13/2015  10:27 AM      To: Scarlette Calico, RN  Fabio Pierce,  Ms. Living will need f/u with DB or Amy/Andy in 1-2 wks.  Thanks,  Gerald Stabs

## 2015-08-24 ENCOUNTER — Other Ambulatory Visit: Payer: Self-pay | Admitting: Internal Medicine

## 2015-09-03 DIAGNOSIS — N19 Unspecified kidney failure: Secondary | ICD-10-CM | POA: Diagnosis not present

## 2015-09-03 DIAGNOSIS — N183 Chronic kidney disease, stage 3 (moderate): Secondary | ICD-10-CM | POA: Diagnosis not present

## 2015-09-03 DIAGNOSIS — I10 Essential (primary) hypertension: Secondary | ICD-10-CM | POA: Diagnosis not present

## 2015-09-03 DIAGNOSIS — I119 Hypertensive heart disease without heart failure: Secondary | ICD-10-CM | POA: Diagnosis not present

## 2015-09-03 DIAGNOSIS — Z89611 Acquired absence of right leg above knee: Secondary | ICD-10-CM | POA: Diagnosis not present

## 2015-09-03 DIAGNOSIS — D638 Anemia in other chronic diseases classified elsewhere: Secondary | ICD-10-CM | POA: Diagnosis not present

## 2015-09-03 DIAGNOSIS — E559 Vitamin D deficiency, unspecified: Secondary | ICD-10-CM | POA: Diagnosis not present

## 2015-09-03 DIAGNOSIS — E7211 Homocystinuria: Secondary | ICD-10-CM | POA: Diagnosis not present

## 2015-09-03 DIAGNOSIS — E119 Type 2 diabetes mellitus without complications: Secondary | ICD-10-CM | POA: Diagnosis not present

## 2015-09-03 DIAGNOSIS — F419 Anxiety disorder, unspecified: Secondary | ICD-10-CM | POA: Diagnosis not present

## 2015-09-03 DIAGNOSIS — E784 Other hyperlipidemia: Secondary | ICD-10-CM | POA: Diagnosis not present

## 2015-09-03 DIAGNOSIS — E538 Deficiency of other specified B group vitamins: Secondary | ICD-10-CM | POA: Diagnosis not present

## 2015-10-14 ENCOUNTER — Other Ambulatory Visit: Payer: Self-pay | Admitting: Internal Medicine

## 2015-10-20 ENCOUNTER — Other Ambulatory Visit: Payer: Self-pay | Admitting: Internal Medicine

## 2015-10-30 ENCOUNTER — Other Ambulatory Visit: Payer: Self-pay | Admitting: Internal Medicine

## 2015-10-30 DIAGNOSIS — E538 Deficiency of other specified B group vitamins: Secondary | ICD-10-CM | POA: Diagnosis not present

## 2015-10-30 DIAGNOSIS — Z89611 Acquired absence of right leg above knee: Secondary | ICD-10-CM | POA: Diagnosis not present

## 2015-10-30 DIAGNOSIS — E7211 Homocystinuria: Secondary | ICD-10-CM | POA: Diagnosis not present

## 2015-10-30 DIAGNOSIS — E559 Vitamin D deficiency, unspecified: Secondary | ICD-10-CM | POA: Diagnosis not present

## 2015-10-30 DIAGNOSIS — E784 Other hyperlipidemia: Secondary | ICD-10-CM | POA: Diagnosis not present

## 2015-10-30 DIAGNOSIS — I10 Essential (primary) hypertension: Secondary | ICD-10-CM | POA: Diagnosis not present

## 2015-10-30 DIAGNOSIS — E119 Type 2 diabetes mellitus without complications: Secondary | ICD-10-CM | POA: Diagnosis not present

## 2015-10-30 DIAGNOSIS — D638 Anemia in other chronic diseases classified elsewhere: Secondary | ICD-10-CM | POA: Diagnosis not present

## 2015-11-12 DIAGNOSIS — N183 Chronic kidney disease, stage 3 (moderate): Secondary | ICD-10-CM | POA: Diagnosis not present

## 2015-11-12 DIAGNOSIS — E119 Type 2 diabetes mellitus without complications: Secondary | ICD-10-CM | POA: Diagnosis not present

## 2015-11-12 DIAGNOSIS — I4891 Unspecified atrial fibrillation: Secondary | ICD-10-CM | POA: Diagnosis not present

## 2015-11-12 DIAGNOSIS — N189 Chronic kidney disease, unspecified: Secondary | ICD-10-CM | POA: Diagnosis not present

## 2015-11-12 DIAGNOSIS — I10 Essential (primary) hypertension: Secondary | ICD-10-CM | POA: Diagnosis not present

## 2015-11-12 DIAGNOSIS — R809 Proteinuria, unspecified: Secondary | ICD-10-CM | POA: Diagnosis not present

## 2015-11-12 DIAGNOSIS — D649 Anemia, unspecified: Secondary | ICD-10-CM | POA: Diagnosis not present

## 2015-11-25 DIAGNOSIS — R0989 Other specified symptoms and signs involving the circulatory and respiratory systems: Secondary | ICD-10-CM | POA: Diagnosis not present

## 2015-11-25 DIAGNOSIS — I129 Hypertensive chronic kidney disease with stage 1 through stage 4 chronic kidney disease, or unspecified chronic kidney disease: Secondary | ICD-10-CM | POA: Diagnosis not present

## 2015-11-25 DIAGNOSIS — N189 Chronic kidney disease, unspecified: Secondary | ICD-10-CM | POA: Diagnosis not present

## 2015-11-25 DIAGNOSIS — N281 Cyst of kidney, acquired: Secondary | ICD-10-CM | POA: Diagnosis not present

## 2015-11-25 DIAGNOSIS — I10 Essential (primary) hypertension: Secondary | ICD-10-CM | POA: Diagnosis not present

## 2015-12-04 DIAGNOSIS — E538 Deficiency of other specified B group vitamins: Secondary | ICD-10-CM | POA: Diagnosis not present

## 2015-12-04 DIAGNOSIS — Z89611 Acquired absence of right leg above knee: Secondary | ICD-10-CM | POA: Diagnosis not present

## 2015-12-04 DIAGNOSIS — E119 Type 2 diabetes mellitus without complications: Secondary | ICD-10-CM | POA: Diagnosis not present

## 2015-12-04 DIAGNOSIS — E784 Other hyperlipidemia: Secondary | ICD-10-CM | POA: Diagnosis not present

## 2015-12-04 DIAGNOSIS — E7211 Homocystinuria: Secondary | ICD-10-CM | POA: Diagnosis not present

## 2015-12-04 DIAGNOSIS — I10 Essential (primary) hypertension: Secondary | ICD-10-CM | POA: Diagnosis not present

## 2015-12-04 DIAGNOSIS — D638 Anemia in other chronic diseases classified elsewhere: Secondary | ICD-10-CM | POA: Diagnosis not present

## 2015-12-04 DIAGNOSIS — R1013 Epigastric pain: Secondary | ICD-10-CM | POA: Diagnosis not present

## 2015-12-25 DIAGNOSIS — E559 Vitamin D deficiency, unspecified: Secondary | ICD-10-CM | POA: Diagnosis not present

## 2015-12-25 DIAGNOSIS — D638 Anemia in other chronic diseases classified elsewhere: Secondary | ICD-10-CM | POA: Diagnosis not present

## 2015-12-25 DIAGNOSIS — Z89611 Acquired absence of right leg above knee: Secondary | ICD-10-CM | POA: Diagnosis not present

## 2015-12-25 DIAGNOSIS — E119 Type 2 diabetes mellitus without complications: Secondary | ICD-10-CM | POA: Diagnosis not present

## 2015-12-25 DIAGNOSIS — E538 Deficiency of other specified B group vitamins: Secondary | ICD-10-CM | POA: Diagnosis not present

## 2015-12-25 DIAGNOSIS — R1013 Epigastric pain: Secondary | ICD-10-CM | POA: Diagnosis not present

## 2015-12-25 DIAGNOSIS — E7211 Homocystinuria: Secondary | ICD-10-CM | POA: Diagnosis not present

## 2015-12-25 DIAGNOSIS — E784 Other hyperlipidemia: Secondary | ICD-10-CM | POA: Diagnosis not present

## 2015-12-25 DIAGNOSIS — N183 Chronic kidney disease, stage 3 (moderate): Secondary | ICD-10-CM | POA: Diagnosis not present

## 2015-12-25 DIAGNOSIS — I10 Essential (primary) hypertension: Secondary | ICD-10-CM | POA: Diagnosis not present

## 2015-12-25 DIAGNOSIS — I119 Hypertensive heart disease without heart failure: Secondary | ICD-10-CM | POA: Diagnosis not present

## 2016-01-04 ENCOUNTER — Ambulatory Visit: Payer: Medicare Other | Admitting: Family

## 2016-01-04 ENCOUNTER — Encounter (HOSPITAL_COMMUNITY): Payer: Medicare Other

## 2016-02-03 ENCOUNTER — Encounter (HOSPITAL_COMMUNITY): Payer: Medicare Other

## 2016-02-03 ENCOUNTER — Ambulatory Visit: Payer: Medicare Other | Admitting: Family

## 2016-02-03 DIAGNOSIS — N189 Chronic kidney disease, unspecified: Secondary | ICD-10-CM | POA: Diagnosis not present

## 2016-02-03 DIAGNOSIS — D631 Anemia in chronic kidney disease: Secondary | ICD-10-CM | POA: Diagnosis not present

## 2016-02-03 DIAGNOSIS — E119 Type 2 diabetes mellitus without complications: Secondary | ICD-10-CM | POA: Diagnosis not present

## 2016-02-03 DIAGNOSIS — D649 Anemia, unspecified: Secondary | ICD-10-CM | POA: Diagnosis not present

## 2016-02-03 DIAGNOSIS — N183 Chronic kidney disease, stage 3 (moderate): Secondary | ICD-10-CM | POA: Diagnosis not present

## 2016-02-03 DIAGNOSIS — E7800 Pure hypercholesterolemia, unspecified: Secondary | ICD-10-CM | POA: Diagnosis not present

## 2016-02-03 DIAGNOSIS — I4891 Unspecified atrial fibrillation: Secondary | ICD-10-CM | POA: Diagnosis not present

## 2016-02-18 DIAGNOSIS — I10 Essential (primary) hypertension: Secondary | ICD-10-CM | POA: Diagnosis not present

## 2016-02-18 DIAGNOSIS — E119 Type 2 diabetes mellitus without complications: Secondary | ICD-10-CM | POA: Diagnosis not present

## 2016-02-18 DIAGNOSIS — E7211 Homocystinuria: Secondary | ICD-10-CM | POA: Diagnosis not present

## 2016-02-18 DIAGNOSIS — D638 Anemia in other chronic diseases classified elsewhere: Secondary | ICD-10-CM | POA: Diagnosis not present

## 2016-02-18 DIAGNOSIS — R1013 Epigastric pain: Secondary | ICD-10-CM | POA: Diagnosis not present

## 2016-02-18 DIAGNOSIS — E784 Other hyperlipidemia: Secondary | ICD-10-CM | POA: Diagnosis not present

## 2016-02-18 DIAGNOSIS — E538 Deficiency of other specified B group vitamins: Secondary | ICD-10-CM | POA: Diagnosis not present

## 2016-02-18 DIAGNOSIS — Z89611 Acquired absence of right leg above knee: Secondary | ICD-10-CM | POA: Diagnosis not present

## 2016-03-11 DIAGNOSIS — E7211 Homocystinuria: Secondary | ICD-10-CM | POA: Diagnosis not present

## 2016-03-11 DIAGNOSIS — E119 Type 2 diabetes mellitus without complications: Secondary | ICD-10-CM | POA: Diagnosis not present

## 2016-03-11 DIAGNOSIS — E784 Other hyperlipidemia: Secondary | ICD-10-CM | POA: Diagnosis not present

## 2016-03-11 DIAGNOSIS — E538 Deficiency of other specified B group vitamins: Secondary | ICD-10-CM | POA: Diagnosis not present

## 2016-03-11 DIAGNOSIS — D638 Anemia in other chronic diseases classified elsewhere: Secondary | ICD-10-CM | POA: Diagnosis not present

## 2016-03-11 DIAGNOSIS — Z0000 Encounter for general adult medical examination without abnormal findings: Secondary | ICD-10-CM | POA: Diagnosis not present

## 2016-03-11 DIAGNOSIS — Z89611 Acquired absence of right leg above knee: Secondary | ICD-10-CM | POA: Diagnosis not present

## 2016-03-11 DIAGNOSIS — I10 Essential (primary) hypertension: Secondary | ICD-10-CM | POA: Diagnosis not present

## 2016-03-24 DIAGNOSIS — E538 Deficiency of other specified B group vitamins: Secondary | ICD-10-CM | POA: Diagnosis not present

## 2016-03-24 DIAGNOSIS — R1013 Epigastric pain: Secondary | ICD-10-CM | POA: Diagnosis not present

## 2016-03-24 DIAGNOSIS — Z89611 Acquired absence of right leg above knee: Secondary | ICD-10-CM | POA: Diagnosis not present

## 2016-03-24 DIAGNOSIS — R05 Cough: Secondary | ICD-10-CM | POA: Diagnosis not present

## 2016-03-24 DIAGNOSIS — I10 Essential (primary) hypertension: Secondary | ICD-10-CM | POA: Diagnosis not present

## 2016-03-24 DIAGNOSIS — E119 Type 2 diabetes mellitus without complications: Secondary | ICD-10-CM | POA: Diagnosis not present

## 2016-03-24 DIAGNOSIS — E784 Other hyperlipidemia: Secondary | ICD-10-CM | POA: Diagnosis not present

## 2016-03-24 DIAGNOSIS — D638 Anemia in other chronic diseases classified elsewhere: Secondary | ICD-10-CM | POA: Diagnosis not present

## 2016-04-18 ENCOUNTER — Other Ambulatory Visit: Payer: Self-pay | Admitting: Nurse Practitioner

## 2016-04-22 DIAGNOSIS — E538 Deficiency of other specified B group vitamins: Secondary | ICD-10-CM | POA: Diagnosis not present

## 2016-04-22 DIAGNOSIS — I10 Essential (primary) hypertension: Secondary | ICD-10-CM | POA: Diagnosis not present

## 2016-04-22 DIAGNOSIS — Z89611 Acquired absence of right leg above knee: Secondary | ICD-10-CM | POA: Diagnosis not present

## 2016-04-22 DIAGNOSIS — D638 Anemia in other chronic diseases classified elsewhere: Secondary | ICD-10-CM | POA: Diagnosis not present

## 2016-04-22 DIAGNOSIS — E119 Type 2 diabetes mellitus without complications: Secondary | ICD-10-CM | POA: Diagnosis not present

## 2016-04-22 DIAGNOSIS — I119 Hypertensive heart disease without heart failure: Secondary | ICD-10-CM | POA: Diagnosis not present

## 2016-04-22 DIAGNOSIS — R1013 Epigastric pain: Secondary | ICD-10-CM | POA: Diagnosis not present

## 2016-04-22 DIAGNOSIS — E784 Other hyperlipidemia: Secondary | ICD-10-CM | POA: Diagnosis not present

## 2016-05-27 DIAGNOSIS — R1013 Epigastric pain: Secondary | ICD-10-CM | POA: Diagnosis not present

## 2016-05-27 DIAGNOSIS — E559 Vitamin D deficiency, unspecified: Secondary | ICD-10-CM | POA: Diagnosis not present

## 2016-05-27 DIAGNOSIS — E119 Type 2 diabetes mellitus without complications: Secondary | ICD-10-CM | POA: Diagnosis not present

## 2016-05-27 DIAGNOSIS — Z89611 Acquired absence of right leg above knee: Secondary | ICD-10-CM | POA: Diagnosis not present

## 2016-05-27 DIAGNOSIS — I10 Essential (primary) hypertension: Secondary | ICD-10-CM | POA: Diagnosis not present

## 2016-05-27 DIAGNOSIS — I119 Hypertensive heart disease without heart failure: Secondary | ICD-10-CM | POA: Diagnosis not present

## 2016-05-27 DIAGNOSIS — D638 Anemia in other chronic diseases classified elsewhere: Secondary | ICD-10-CM | POA: Diagnosis not present

## 2016-05-27 DIAGNOSIS — E784 Other hyperlipidemia: Secondary | ICD-10-CM | POA: Diagnosis not present

## 2016-05-27 DIAGNOSIS — E538 Deficiency of other specified B group vitamins: Secondary | ICD-10-CM | POA: Diagnosis not present

## 2016-07-20 DIAGNOSIS — M109 Gout, unspecified: Secondary | ICD-10-CM | POA: Diagnosis not present

## 2016-07-20 DIAGNOSIS — I10 Essential (primary) hypertension: Secondary | ICD-10-CM | POA: Diagnosis not present

## 2016-07-20 DIAGNOSIS — N189 Chronic kidney disease, unspecified: Secondary | ICD-10-CM | POA: Diagnosis not present

## 2016-07-20 DIAGNOSIS — E119 Type 2 diabetes mellitus without complications: Secondary | ICD-10-CM | POA: Diagnosis not present

## 2016-07-20 DIAGNOSIS — N183 Chronic kidney disease, stage 3 (moderate): Secondary | ICD-10-CM | POA: Diagnosis not present

## 2016-07-20 DIAGNOSIS — I4891 Unspecified atrial fibrillation: Secondary | ICD-10-CM | POA: Diagnosis not present

## 2016-07-20 DIAGNOSIS — D649 Anemia, unspecified: Secondary | ICD-10-CM | POA: Diagnosis not present

## 2016-07-22 DIAGNOSIS — E538 Deficiency of other specified B group vitamins: Secondary | ICD-10-CM | POA: Diagnosis not present

## 2016-07-22 DIAGNOSIS — I119 Hypertensive heart disease without heart failure: Secondary | ICD-10-CM | POA: Diagnosis not present

## 2016-07-22 DIAGNOSIS — I10 Essential (primary) hypertension: Secondary | ICD-10-CM | POA: Diagnosis not present

## 2016-07-22 DIAGNOSIS — R1013 Epigastric pain: Secondary | ICD-10-CM | POA: Diagnosis not present

## 2016-07-22 DIAGNOSIS — D638 Anemia in other chronic diseases classified elsewhere: Secondary | ICD-10-CM | POA: Diagnosis not present

## 2016-07-22 DIAGNOSIS — E784 Other hyperlipidemia: Secondary | ICD-10-CM | POA: Diagnosis not present

## 2016-07-22 DIAGNOSIS — N183 Chronic kidney disease, stage 3 (moderate): Secondary | ICD-10-CM | POA: Diagnosis not present

## 2016-07-22 DIAGNOSIS — E119 Type 2 diabetes mellitus without complications: Secondary | ICD-10-CM | POA: Diagnosis not present

## 2016-11-23 DIAGNOSIS — E7800 Pure hypercholesterolemia, unspecified: Secondary | ICD-10-CM | POA: Diagnosis not present

## 2016-11-23 DIAGNOSIS — I1310 Hypertensive heart and chronic kidney disease without heart failure, with stage 1 through stage 4 chronic kidney disease, or unspecified chronic kidney disease: Secondary | ICD-10-CM | POA: Diagnosis not present

## 2016-11-23 DIAGNOSIS — N189 Chronic kidney disease, unspecified: Secondary | ICD-10-CM | POA: Diagnosis not present

## 2016-11-23 DIAGNOSIS — I10 Essential (primary) hypertension: Secondary | ICD-10-CM | POA: Diagnosis not present

## 2016-11-23 DIAGNOSIS — D631 Anemia in chronic kidney disease: Secondary | ICD-10-CM | POA: Diagnosis not present

## 2016-11-23 DIAGNOSIS — E039 Hypothyroidism, unspecified: Secondary | ICD-10-CM | POA: Diagnosis not present

## 2016-11-23 DIAGNOSIS — M109 Gout, unspecified: Secondary | ICD-10-CM | POA: Diagnosis not present

## 2016-11-23 DIAGNOSIS — I4891 Unspecified atrial fibrillation: Secondary | ICD-10-CM | POA: Diagnosis not present

## 2016-11-23 DIAGNOSIS — R809 Proteinuria, unspecified: Secondary | ICD-10-CM | POA: Diagnosis not present

## 2016-11-23 DIAGNOSIS — N183 Chronic kidney disease, stage 3 (moderate): Secondary | ICD-10-CM | POA: Diagnosis not present

## 2016-11-23 DIAGNOSIS — D649 Anemia, unspecified: Secondary | ICD-10-CM | POA: Diagnosis not present

## 2016-11-23 DIAGNOSIS — E119 Type 2 diabetes mellitus without complications: Secondary | ICD-10-CM | POA: Diagnosis not present

## 2016-11-23 DIAGNOSIS — I43 Cardiomyopathy in diseases classified elsewhere: Secondary | ICD-10-CM | POA: Diagnosis not present

## 2016-11-25 DIAGNOSIS — E784 Other hyperlipidemia: Secondary | ICD-10-CM | POA: Diagnosis not present

## 2016-11-25 DIAGNOSIS — Z89611 Acquired absence of right leg above knee: Secondary | ICD-10-CM | POA: Diagnosis not present

## 2016-11-25 DIAGNOSIS — E538 Deficiency of other specified B group vitamins: Secondary | ICD-10-CM | POA: Diagnosis not present

## 2016-11-25 DIAGNOSIS — R1013 Epigastric pain: Secondary | ICD-10-CM | POA: Diagnosis not present

## 2016-11-25 DIAGNOSIS — E559 Vitamin D deficiency, unspecified: Secondary | ICD-10-CM | POA: Diagnosis not present

## 2016-11-25 DIAGNOSIS — I10 Essential (primary) hypertension: Secondary | ICD-10-CM | POA: Diagnosis not present

## 2016-11-25 DIAGNOSIS — N183 Chronic kidney disease, stage 3 (moderate): Secondary | ICD-10-CM | POA: Diagnosis not present

## 2016-11-25 DIAGNOSIS — I119 Hypertensive heart disease without heart failure: Secondary | ICD-10-CM | POA: Diagnosis not present

## 2016-11-25 DIAGNOSIS — E119 Type 2 diabetes mellitus without complications: Secondary | ICD-10-CM | POA: Diagnosis not present

## 2016-11-25 DIAGNOSIS — D638 Anemia in other chronic diseases classified elsewhere: Secondary | ICD-10-CM | POA: Diagnosis not present

## 2016-12-24 DIAGNOSIS — R079 Chest pain, unspecified: Secondary | ICD-10-CM | POA: Diagnosis not present

## 2016-12-24 DIAGNOSIS — I10 Essential (primary) hypertension: Secondary | ICD-10-CM | POA: Diagnosis not present

## 2016-12-25 ENCOUNTER — Encounter (HOSPITAL_COMMUNITY): Payer: Self-pay | Admitting: Emergency Medicine

## 2016-12-25 ENCOUNTER — Emergency Department (HOSPITAL_COMMUNITY): Payer: Medicare Other

## 2016-12-25 ENCOUNTER — Inpatient Hospital Stay (HOSPITAL_COMMUNITY)
Admission: EM | Admit: 2016-12-25 | Discharge: 2017-01-03 | DRG: 280 | Disposition: A | Discharge status: Home / Self Care | Payer: Medicare Other | Attending: Cardiology | Admitting: Cardiology

## 2016-12-25 DIAGNOSIS — I10 Essential (primary) hypertension: Secondary | ICD-10-CM | POA: Diagnosis not present

## 2016-12-25 DIAGNOSIS — Z7982 Long term (current) use of aspirin: Secondary | ICD-10-CM | POA: Diagnosis not present

## 2016-12-25 DIAGNOSIS — I5023 Acute on chronic systolic (congestive) heart failure: Secondary | ICD-10-CM | POA: Diagnosis present

## 2016-12-25 DIAGNOSIS — N185 Chronic kidney disease, stage 5: Secondary | ICD-10-CM | POA: Diagnosis not present

## 2016-12-25 DIAGNOSIS — R748 Abnormal levels of other serum enzymes: Secondary | ICD-10-CM | POA: Diagnosis not present

## 2016-12-25 DIAGNOSIS — Z9119 Patient's noncompliance with other medical treatment and regimen: Secondary | ICD-10-CM

## 2016-12-25 DIAGNOSIS — D649 Anemia, unspecified: Secondary | ICD-10-CM | POA: Diagnosis present

## 2016-12-25 DIAGNOSIS — I255 Ischemic cardiomyopathy: Secondary | ICD-10-CM | POA: Diagnosis not present

## 2016-12-25 DIAGNOSIS — I130 Hypertensive heart and chronic kidney disease with heart failure and stage 1 through stage 4 chronic kidney disease, or unspecified chronic kidney disease: Secondary | ICD-10-CM | POA: Diagnosis present

## 2016-12-25 DIAGNOSIS — I200 Unstable angina: Secondary | ICD-10-CM

## 2016-12-25 DIAGNOSIS — I161 Hypertensive emergency: Principal | ICD-10-CM | POA: Diagnosis present

## 2016-12-25 DIAGNOSIS — N179 Acute kidney failure, unspecified: Secondary | ICD-10-CM | POA: Diagnosis present

## 2016-12-25 DIAGNOSIS — K219 Gastro-esophageal reflux disease without esophagitis: Secondary | ICD-10-CM | POA: Diagnosis present

## 2016-12-25 DIAGNOSIS — I252 Old myocardial infarction: Secondary | ICD-10-CM

## 2016-12-25 DIAGNOSIS — I160 Hypertensive urgency: Secondary | ICD-10-CM | POA: Diagnosis not present

## 2016-12-25 DIAGNOSIS — I169 Hypertensive crisis, unspecified: Secondary | ICD-10-CM | POA: Diagnosis not present

## 2016-12-25 DIAGNOSIS — N184 Chronic kidney disease, stage 4 (severe): Secondary | ICD-10-CM | POA: Diagnosis not present

## 2016-12-25 DIAGNOSIS — E1121 Type 2 diabetes mellitus with diabetic nephropathy: Secondary | ICD-10-CM | POA: Diagnosis not present

## 2016-12-25 DIAGNOSIS — I21A1 Myocardial infarction type 2: Secondary | ICD-10-CM | POA: Diagnosis present

## 2016-12-25 DIAGNOSIS — I5022 Chronic systolic (congestive) heart failure: Secondary | ICD-10-CM | POA: Diagnosis present

## 2016-12-25 DIAGNOSIS — Z89612 Acquired absence of left leg above knee: Secondary | ICD-10-CM

## 2016-12-25 DIAGNOSIS — E118 Type 2 diabetes mellitus with unspecified complications: Secondary | ICD-10-CM | POA: Diagnosis not present

## 2016-12-25 DIAGNOSIS — E785 Hyperlipidemia, unspecified: Secondary | ICD-10-CM | POA: Diagnosis present

## 2016-12-25 DIAGNOSIS — Z794 Long term (current) use of insulin: Secondary | ICD-10-CM

## 2016-12-25 DIAGNOSIS — Z8673 Personal history of transient ischemic attack (TIA), and cerebral infarction without residual deficits: Secondary | ICD-10-CM | POA: Diagnosis not present

## 2016-12-25 DIAGNOSIS — I5042 Chronic combined systolic (congestive) and diastolic (congestive) heart failure: Secondary | ICD-10-CM | POA: Diagnosis present

## 2016-12-25 DIAGNOSIS — R070 Pain in throat: Secondary | ICD-10-CM | POA: Diagnosis not present

## 2016-12-25 DIAGNOSIS — N189 Chronic kidney disease, unspecified: Secondary | ICD-10-CM | POA: Diagnosis present

## 2016-12-25 DIAGNOSIS — I429 Cardiomyopathy, unspecified: Secondary | ICD-10-CM | POA: Diagnosis not present

## 2016-12-25 DIAGNOSIS — E7800 Pure hypercholesterolemia, unspecified: Secondary | ICD-10-CM | POA: Diagnosis not present

## 2016-12-25 DIAGNOSIS — I214 Non-ST elevation (NSTEMI) myocardial infarction: Secondary | ICD-10-CM | POA: Diagnosis present

## 2016-12-25 DIAGNOSIS — Z833 Family history of diabetes mellitus: Secondary | ICD-10-CM

## 2016-12-25 DIAGNOSIS — E1165 Type 2 diabetes mellitus with hyperglycemia: Secondary | ICD-10-CM | POA: Diagnosis present

## 2016-12-25 DIAGNOSIS — I2510 Atherosclerotic heart disease of native coronary artery without angina pectoris: Secondary | ICD-10-CM | POA: Diagnosis present

## 2016-12-25 DIAGNOSIS — R079 Chest pain, unspecified: Secondary | ICD-10-CM | POA: Diagnosis not present

## 2016-12-25 DIAGNOSIS — E1151 Type 2 diabetes mellitus with diabetic peripheral angiopathy without gangrene: Secondary | ICD-10-CM | POA: Diagnosis present

## 2016-12-25 DIAGNOSIS — E8770 Fluid overload, unspecified: Secondary | ICD-10-CM | POA: Diagnosis not present

## 2016-12-25 DIAGNOSIS — Z79899 Other long term (current) drug therapy: Secondary | ICD-10-CM

## 2016-12-25 DIAGNOSIS — Z89611 Acquired absence of right leg above knee: Secondary | ICD-10-CM

## 2016-12-25 DIAGNOSIS — Z87891 Personal history of nicotine dependence: Secondary | ICD-10-CM

## 2016-12-25 DIAGNOSIS — IMO0001 Reserved for inherently not codable concepts without codable children: Secondary | ICD-10-CM | POA: Diagnosis present

## 2016-12-25 DIAGNOSIS — E784 Other hyperlipidemia: Secondary | ICD-10-CM | POA: Diagnosis not present

## 2016-12-25 DIAGNOSIS — E1029 Type 1 diabetes mellitus with other diabetic kidney complication: Secondary | ICD-10-CM | POA: Diagnosis not present

## 2016-12-25 DIAGNOSIS — E1122 Type 2 diabetes mellitus with diabetic chronic kidney disease: Secondary | ICD-10-CM | POA: Diagnosis present

## 2016-12-25 DIAGNOSIS — E1065 Type 1 diabetes mellitus with hyperglycemia: Secondary | ICD-10-CM | POA: Diagnosis not present

## 2016-12-25 DIAGNOSIS — E1129 Type 2 diabetes mellitus with other diabetic kidney complication: Secondary | ICD-10-CM | POA: Diagnosis not present

## 2016-12-25 DIAGNOSIS — I5189 Other ill-defined heart diseases: Secondary | ICD-10-CM | POA: Diagnosis not present

## 2016-12-25 DIAGNOSIS — I700 Atherosclerosis of aorta: Secondary | ICD-10-CM | POA: Diagnosis not present

## 2016-12-25 DIAGNOSIS — K3189 Other diseases of stomach and duodenum: Secondary | ICD-10-CM | POA: Diagnosis not present

## 2016-12-25 DIAGNOSIS — R1312 Dysphagia, oropharyngeal phase: Secondary | ICD-10-CM | POA: Diagnosis not present

## 2016-12-25 DIAGNOSIS — I129 Hypertensive chronic kidney disease with stage 1 through stage 4 chronic kidney disease, or unspecified chronic kidney disease: Secondary | ICD-10-CM | POA: Diagnosis not present

## 2016-12-25 DIAGNOSIS — R1310 Dysphagia, unspecified: Secondary | ICD-10-CM

## 2016-12-25 DIAGNOSIS — E119 Type 2 diabetes mellitus without complications: Secondary | ICD-10-CM | POA: Diagnosis not present

## 2016-12-25 DIAGNOSIS — I5021 Acute systolic (congestive) heart failure: Secondary | ICD-10-CM | POA: Diagnosis not present

## 2016-12-25 LAB — CBC
HCT: 31.5 % — ABNORMAL LOW (ref 36.0–46.0)
Hemoglobin: 9.7 g/dL — ABNORMAL LOW (ref 12.0–15.0)
MCH: 25.5 pg — AB (ref 26.0–34.0)
MCHC: 30.8 g/dL (ref 30.0–36.0)
MCV: 82.7 fL (ref 78.0–100.0)
PLATELETS: 319 10*3/uL (ref 150–400)
RBC: 3.81 MIL/uL — AB (ref 3.87–5.11)
RDW: 12.9 % (ref 11.5–15.5)
WBC: 11.7 10*3/uL — ABNORMAL HIGH (ref 4.0–10.5)

## 2016-12-25 LAB — CBC WITH DIFFERENTIAL/PLATELET
BASOS ABS: 0 10*3/uL (ref 0.0–0.1)
BASOS PCT: 0 %
EOS PCT: 0 %
Eosinophils Absolute: 0.1 10*3/uL (ref 0.0–0.7)
HCT: 33.2 % — ABNORMAL LOW (ref 36.0–46.0)
Hemoglobin: 10.4 g/dL — ABNORMAL LOW (ref 12.0–15.0)
Lymphocytes Relative: 15 %
Lymphs Abs: 1.7 10*3/uL (ref 0.7–4.0)
MCH: 26.1 pg (ref 26.0–34.0)
MCHC: 31.3 g/dL (ref 30.0–36.0)
MCV: 83.4 fL (ref 78.0–100.0)
MONO ABS: 0.6 10*3/uL (ref 0.1–1.0)
MONOS PCT: 6 %
Neutro Abs: 9.1 10*3/uL — ABNORMAL HIGH (ref 1.7–7.7)
Neutrophils Relative %: 79 %
PLATELETS: 317 10*3/uL (ref 150–400)
RBC: 3.98 MIL/uL (ref 3.87–5.11)
RDW: 12.8 % (ref 11.5–15.5)
WBC: 11.5 10*3/uL — ABNORMAL HIGH (ref 4.0–10.5)

## 2016-12-25 LAB — LIPID PANEL
CHOL/HDL RATIO: 6.4 ratio
Cholesterol: 262 mg/dL — ABNORMAL HIGH (ref 0–200)
HDL: 41 mg/dL (ref >40)
LDL Cholesterol: 197 mg/dL — ABNORMAL HIGH (ref 0–99)
TRIGLYCERIDES: 118 mg/dL (ref <150)
VLDL: 24 mg/dL (ref 0–40)

## 2016-12-25 LAB — I-STAT TROPONIN, ED: TROPONIN I, POC: 0.53 ng/mL — AB (ref 0.00–0.08)

## 2016-12-25 LAB — BASIC METABOLIC PANEL
ANION GAP: 11 (ref 5–15)
BUN: 46 mg/dL — ABNORMAL HIGH (ref 6–20)
CHLORIDE: 105 mmol/L (ref 101–111)
CO2: 23 mmol/L (ref 22–32)
Calcium: 8.6 mg/dL — ABNORMAL LOW (ref 8.9–10.3)
Creatinine, Ser: 2.6 mg/dL — ABNORMAL HIGH (ref 0.44–1.00)
GFR, EST AFRICAN AMERICAN: 21 mL/min — AB (ref >60)
GFR, EST NON AFRICAN AMERICAN: 18 mL/min — AB (ref >60)
Glucose, Bld: 197 mg/dL — ABNORMAL HIGH (ref 65–99)
POTASSIUM: 4.5 mmol/L (ref 3.5–5.1)
SODIUM: 139 mmol/L (ref 135–145)

## 2016-12-25 LAB — TROPONIN I
TROPONIN I: 4.53 ng/mL — AB (ref <0.03)
Troponin I: 4.62 ng/mL (ref <0.03)
Troponin I: 4.61 ng/mL (ref <0.03)

## 2016-12-25 LAB — I-STAT CHEM 8, ED
BUN: 41 mg/dL — ABNORMAL HIGH (ref 6–20)
CREATININE: 2.9 mg/dL — AB (ref 0.44–1.00)
Calcium, Ion: 1.09 mmol/L — ABNORMAL LOW (ref 1.15–1.40)
Chloride: 107 mmol/L (ref 101–111)
Glucose, Bld: 272 mg/dL — ABNORMAL HIGH (ref 65–99)
HEMATOCRIT: 33 % — AB (ref 36.0–46.0)
HEMOGLOBIN: 11.2 g/dL — AB (ref 12.0–15.0)
POTASSIUM: 4.5 mmol/L (ref 3.5–5.1)
Sodium: 138 mmol/L (ref 135–145)
TCO2: 25 mmol/L (ref 0–100)

## 2016-12-25 LAB — HIV ANTIBODY (ROUTINE TESTING W REFLEX): HIV Screen 4th Generation wRfx: NONREACTIVE

## 2016-12-25 LAB — GLUCOSE, CAPILLARY
GLUCOSE-CAPILLARY: 223 mg/dL — AB (ref 65–99)
GLUCOSE-CAPILLARY: 157 mg/dL — AB (ref 65–99)
GLUCOSE-CAPILLARY: 210 mg/dL — AB (ref 65–99)
Glucose-Capillary: 233 mg/dL — ABNORMAL HIGH (ref 65–99)

## 2016-12-25 LAB — HEPARIN LEVEL (UNFRACTIONATED)
HEPARIN UNFRACTIONATED: 0.29 [IU]/mL — AB (ref 0.30–0.70)
Heparin Unfractionated: 0.44 IU/mL (ref 0.30–0.70)

## 2016-12-25 LAB — DIGOXIN LEVEL: Digoxin Level: <0.2 ng/mL — ABNORMAL LOW (ref 0.8–2.0)

## 2016-12-25 MED ORDER — HEPARIN (PORCINE) IN NACL 100-0.45 UNIT/ML-% IJ SOLN
1050.0000 [IU]/h | INTRAMUSCULAR | Status: DC
  Administered 2016-12-25: 900 [IU]/h via INTRAVENOUS
  Administered 2016-12-26: 1050 [IU]/h via INTRAVENOUS
  Filled 2016-12-25 (×2): qty 250

## 2016-12-25 MED ORDER — CARVEDILOL 12.5 MG PO TABS
6.2500 mg | ORAL_TABLET | Freq: Once | ORAL | Status: AC
  Administered 2016-12-25: 6.25 mg via ORAL
  Filled 2016-12-25: qty 1

## 2016-12-25 MED ORDER — ISOSORBIDE MONONITRATE ER 30 MG PO TB24
30.0000 mg | ORAL_TABLET | Freq: Every day | ORAL | Status: DC
  Administered 2016-12-25 – 2016-12-26 (×2): 30 mg via ORAL
  Filled 2016-12-25 (×2): qty 1

## 2016-12-25 MED ORDER — NITROGLYCERIN 0.4 MG SL SUBL
0.4000 mg | SUBLINGUAL_TABLET | SUBLINGUAL | Status: DC | PRN
  Administered 2016-12-31 (×2): 0.4 mg via SUBLINGUAL
  Filled 2016-12-25 (×2): qty 1

## 2016-12-25 MED ORDER — ASPIRIN 81 MG PO CHEW
81.0000 mg | CHEWABLE_TABLET | Freq: Every day | ORAL | Status: DC
  Administered 2016-12-25 – 2017-01-03 (×10): 81 mg via ORAL
  Filled 2016-12-25 (×10): qty 1

## 2016-12-25 MED ORDER — DIGOXIN 125 MCG PO TABS
0.1250 mg | ORAL_TABLET | Freq: Every day | ORAL | Status: DC
  Administered 2016-12-25: 0.125 mg via ORAL
  Filled 2016-12-25 (×2): qty 1

## 2016-12-25 MED ORDER — LOSARTAN POTASSIUM 50 MG PO TABS
100.0000 mg | ORAL_TABLET | Freq: Every day | ORAL | Status: DC
  Administered 2016-12-25 – 2017-01-03 (×10): 100 mg via ORAL
  Filled 2016-12-25 (×12): qty 2

## 2016-12-25 MED ORDER — ALPRAZOLAM 0.25 MG PO TABS
0.2500 mg | ORAL_TABLET | Freq: Three times a day (TID) | ORAL | Status: DC | PRN
  Administered 2016-12-25 – 2017-01-02 (×8): 0.25 mg via ORAL
  Filled 2016-12-25 (×8): qty 1

## 2016-12-25 MED ORDER — NITROGLYCERIN IN D5W 200-5 MCG/ML-% IV SOLN
5.0000 ug/min | INTRAVENOUS | Status: DC
  Administered 2016-12-25: 10 ug/min via INTRAVENOUS
  Filled 2016-12-25: qty 250

## 2016-12-25 MED ORDER — INSULIN GLARGINE 100 UNIT/ML ~~LOC~~ SOLN
35.0000 [IU] | Freq: Every day | SUBCUTANEOUS | Status: DC
  Administered 2016-12-25: 35 [IU] via SUBCUTANEOUS
  Filled 2016-12-25: qty 0.35

## 2016-12-25 MED ORDER — INSULIN ASPART 100 UNIT/ML ~~LOC~~ SOLN
5.0000 [IU] | Freq: Three times a day (TID) | SUBCUTANEOUS | Status: DC
  Administered 2016-12-25 – 2017-01-03 (×18): 5 [IU] via SUBCUTANEOUS

## 2016-12-25 MED ORDER — FUROSEMIDE 10 MG/ML IJ SOLN
80.0000 mg | Freq: Two times a day (BID) | INTRAMUSCULAR | Status: DC
  Administered 2016-12-25 – 2016-12-27 (×4): 80 mg via INTRAVENOUS
  Filled 2016-12-25 (×4): qty 8

## 2016-12-25 MED ORDER — ROSUVASTATIN CALCIUM 10 MG PO TABS
20.0000 mg | ORAL_TABLET | Freq: Every day | ORAL | Status: DC
  Administered 2016-12-25: 20 mg via ORAL
  Filled 2016-12-25 (×2): qty 2

## 2016-12-25 MED ORDER — ACETAMINOPHEN 325 MG PO TABS
650.0000 mg | ORAL_TABLET | Freq: Three times a day (TID) | ORAL | Status: DC | PRN
  Administered 2016-12-25 – 2017-01-02 (×7): 650 mg via ORAL
  Filled 2016-12-25 (×7): qty 2

## 2016-12-25 MED ORDER — INSULIN ASPART 100 UNIT/ML ~~LOC~~ SOLN
0.0000 [IU] | Freq: Three times a day (TID) | SUBCUTANEOUS | Status: DC
  Administered 2016-12-25: 3 [IU] via SUBCUTANEOUS
  Administered 2016-12-25 (×2): 5 [IU] via SUBCUTANEOUS
  Administered 2016-12-26: 2 [IU] via SUBCUTANEOUS
  Administered 2016-12-26: 5 [IU] via SUBCUTANEOUS
  Administered 2016-12-27: 3 [IU] via SUBCUTANEOUS
  Administered 2016-12-28: 2 [IU] via SUBCUTANEOUS
  Administered 2016-12-28 (×2): 3 [IU] via SUBCUTANEOUS
  Administered 2016-12-29: 5 [IU] via SUBCUTANEOUS
  Administered 2016-12-30 (×2): 3 [IU] via SUBCUTANEOUS
  Administered 2016-12-30: 2 [IU] via SUBCUTANEOUS
  Administered 2016-12-31: 3 [IU] via SUBCUTANEOUS
  Administered 2016-12-31: 5 [IU] via SUBCUTANEOUS
  Administered 2017-01-01: 2 [IU] via SUBCUTANEOUS
  Administered 2017-01-01 – 2017-01-02 (×2): 5 [IU] via SUBCUTANEOUS
  Administered 2017-01-02: 1 [IU] via SUBCUTANEOUS
  Administered 2017-01-02: 3 [IU] via SUBCUTANEOUS
  Administered 2017-01-03 (×2): 2 [IU] via SUBCUTANEOUS

## 2016-12-25 MED ORDER — HEPARIN BOLUS VIA INFUSION
3000.0000 [IU] | Freq: Once | INTRAVENOUS | Status: AC
  Administered 2016-12-25: 3000 [IU] via INTRAVENOUS
  Filled 2016-12-25: qty 3000

## 2016-12-25 MED ORDER — INSULIN ASPART 100 UNIT/ML ~~LOC~~ SOLN
0.0000 [IU] | Freq: Every day | SUBCUTANEOUS | Status: DC
  Administered 2016-12-25 – 2016-12-30 (×5): 2 [IU] via SUBCUTANEOUS

## 2016-12-25 MED ORDER — METOPROLOL SUCCINATE ER 25 MG PO TB24
25.0000 mg | ORAL_TABLET | Freq: Every day | ORAL | Status: DC
  Administered 2016-12-25 – 2016-12-26 (×2): 25 mg via ORAL
  Filled 2016-12-25 (×2): qty 1

## 2016-12-25 MED ORDER — ONDANSETRON HCL 4 MG/2ML IJ SOLN: 4.0000 mg | Freq: Four times a day (QID) | INTRAMUSCULAR | Status: DC | PRN

## 2016-12-25 NOTE — H&P (Signed)
Patient ID: Jenna Brown MRN: [id number], DOB/AGE: 22-Jan-1951   Admit date: 12/25/2016   Primary Physician: [name], MD Primary Cardiologist: N/A Reason for admission: Hypertensive emergency, NSTEMI  Jenna Brown is a 66 y.o. female with a history significant for multivessel-CAD deemed to be a poor candidate for surgical or percutaneous revascularization (Prox LAD to Mid LAD lesion 90%, Ost Cx to Mid Cx lesion 95%, Ost 2nd Mrg to 2nd Mrg lesion 80%,Mid Cx lesion 90%, Prox RCA lesion 60%, Mid RCA lesion 50%),   ICM leading to HFrEF (LVEF=20%, IDDM, HTN, dyslipidemia, prior CVA, and s/p right BKA who presented with chest discomfort in the setting of SBP>24mmHg.  The pt is a very poor historian, seems to be 2/2 mild cognitive impairment, but she notes that she experienced a discomfort in her chest that she describes as "an uneasiness that makes me wiggle". This occurs 5 to 6 times a week but typically is relieved with SLNG, but this evening 1 SLNG tablet did not resolve her pain and she didn't have any more to take so called paramedics.  Per documentation her SBP was >210mmHg on their arrival and was recorded as 217mmHg on arrival to the ED.  She denies concurrent SOB, nausea, diaphoresis, visual disturbance, palpitations, or sensation of a racing heart.  She endorses adherence to her prescribed regimen but cannot describe the medicines she takes, and notably she only takes pills in the morning despite being prescribed BID carvedilol.   Problem List  Past Medical History:  Diagnosis Date  . Anxiety    takes Xanax prn  . Arthritis   . GERD (gastroesophageal reflux disease)   . H/O hiatal hernia   . Headache(784.0)    when b/p is elevated  . Hemorrhoids   . Hyperlipidemia    takes Simvastatin daily  . Hypertension    takes Amlodipine/HCTZ/Losartan daily  . Migraines   . Myocardial infarction 1990's  . Peripheral vascular disease ([place])   . PONV (postoperative nausea  and vomiting)   . Stroke [facility])    TIA history  . Type II diabetes mellitus (HCC)    takes Glucovance and Januvia daily  . Vertigo    takes Ativert prn    Past Surgical History:  Procedure Laterality Date  . ABDOMINAL AORTAGRAM N/A 02/29/2012   Procedure: ABDOMINAL Maxcine Ham;  Surgeon: [name], MD;  Location: [facility] CATH LAB;  Service: Cardiovascular;  Laterality: N/A;  . AMPUTATION  05/10/2012   Procedure: AMPUTATION ABOVE KNEE;  Surgeon: [name], MD;  Location: [place] OR;  Service: Vascular;  Laterality: Right;   open right groin wound noted  . aortogram  02/2012  . CARDIAC CATHETERIZATION N/A 04/13/2015   Procedure: Right/Left Heart Cath and Coronary Angiography;  Surgeon: [name], MD;  Location: [place] CV LAB;  Service: Cardiovascular;  Laterality: N/A;  . CLEFT PALATE REPAIR     several  . COLONOSCOPY    . DILATION AND CURETTAGE OF UTERUS    . FEMORAL-POPLITEAL BYPASS GRAFT  04/05/2012   Procedure: BYPASS GRAFT FEMORAL-POPLITEAL ARTERY;  Surgeon: [name], MD;  Location: [place];  Service: Vascular;  Laterality: Right;  REVISION  . FEMORAL-TIBIAL BYPASS GRAFT  03/29/2012   Procedure: BYPASS GRAFT FEMORAL-TIBIAL ARTERY;  Surgeon: [name], MD;  Location: [place] OR;  Service: Vascular;  Laterality: Right;  Right femoral to Posterior Tibialis with composite graft of 68mm x 80 cm ringed gortex graft and saphenous vein  ,intraoperative arteriogram  .  FEMOROPOPLITEAL THROMBECTOMY / EMBOLECTOMY  05/09/2012  . MYOMECTOMY    . PR VEIN BYPASS GRAFT,AORTO-FEM-POP  05/27/11   Right SFA-Below knee Pop BP  . TONSILLECTOMY    . TUBAL LIGATION  ~ 1990     Allergies  Allergies  Allergen Reactions  . Plavix [Clopidogrel Bisulfate] Itching  . Codeine Other (See Comments)    "makes me feel strange"  . Tylenol With Codeine #3 [Acetaminophen-Codeine] Other (See Comments)    "doesn't make me feel right"  . Tylenol [Acetaminophen] Other (See Comments)    "Doesn't feel  right"      Home Medications  Prior to Admission medications   Medication Sig Start Date End Date Taking? Authorizing Provider  acetaminophen (TYLENOL) 650 MG CR tablet Take 650 mg by mouth every 8 (eight) hours as needed for pain.    Historical Provider, MD  ALPRAZolam Duanne Moron) 0.25 MG tablet Take 0.25 mg by mouth 3 (three) times daily as needed for anxiety.     Historical Provider, MD  aspirin 81 MG chewable tablet Chew 81 mg by mouth daily.    Historical Provider, MD  carvedilol (COREG) 3.125 MG tablet Take 3 tablets (9.375 mg total) by mouth 2 (two) times daily with a meal. 06/18/15   Amy D Clegg, NP  digoxin (LANOXIN) 0.125 MG tablet Take 1 tablet (0.125 mg total) by mouth daily. 05/14/15   Amy D Ninfa Meeker, NP  furosemide (LASIX) 40 MG tablet Take 1 tablet (40 mg total) by mouth daily. 05/14/15   Amy D Clegg, NP  insulin glargine (LANTUS) 100 UNIT/ML injection Inject 35 Units into the skin at bedtime.     Historical Provider, MD  insulin lispro (HUMALOG) 100 UNIT/ML injection Inject 5 Units into the skin 3 (three) times daily before meals.    Historical Provider, MD  Insulin Pen Needle (AURORA PEN NEEDLES) 29G X 12MM MISC 5 Units by Does not apply route 3 (three) times daily before meals. 05/16/15   Ricard Dillon, MD  isosorbide mononitrate (IMDUR) 30 MG 24 hr tablet take 1 tablet by mouth once daily 04/18/16   Jolaine Artist, MD  losartan (COZAAR) 50 MG tablet Take 1 tablet (50 mg total) by mouth daily. 05/28/15   Amy D Ninfa Meeker, NP  meclizine (ANTIVERT) 25 MG tablet Take 25 mg by mouth daily as needed for dizziness.     Historical Provider, MD  metFORMIN (GLUCOPHAGE) 1000 MG tablet take 1 tablet by mouth twice a day 08/24/15   Tiffany L Reed, DO  nitroGLYCERIN (NITROSTAT) 0.4 MG SL tablet Place 1 tablet (0.4 mg total) under the tongue every 5 (five) minutes x 3 doses as needed for chest pain. 08/13/15   Rogelia Mire, NP  rosuvastatin (CRESTOR) 20 MG tablet Take 20 mg by mouth daily.     Historical Provider, MD  ticagrelor (BRILINTA) 90 MG TABS tablet Take 1 tablet (90 mg total) by mouth 2 (two) times daily. 08/13/15   Rogelia Mire, NP    Family History  Family History  Problem Relation Age of Onset  . Cancer Mother     breast  . Diabetes Father      Family Status  Relation Status  . Mother Deceased at age 47   Breast cancer  . Father Deceased at age 28   Diabetes     Social History  Social History   Social History  . Marital status: Single    Spouse name: N/A  . Number of children: N/A  .  Years of education: N/A   Occupational History  . Not on file.   Social History Main Topics  . Smoking status: Former Smoker    Packs/day: 0.25    Years: 40.00    Types: Cigarettes    Quit date: 02/27/2012  . Smokeless tobacco: Never Used  . Alcohol use No  . Drug use: No  . Sexual activity: No   Other Topics Concern  . Not on file   Social History Narrative  . No narrative on file     Review of Systems General:  No chills, fever, night sweats or weight changes.  Cardiovascular:  No chest pain, dyspnea on exertion, edema, orthopnea, palpitations, paroxysmal nocturnal dyspnea. Dermatological: No rash, lesions/masses Respiratory: No cough, dyspnea Urologic: No hematuria, dysuria Abdominal:   No nausea, vomiting, diarrhea, bright red blood per rectum, melena, or hematemesis Neurologic:  No visual changes, wkns, changes in mental status. All other systems reviewed and are otherwise negative except as noted above.  Physical Exam  Blood pressure (!) 211/93, pulse 92, temperature 98.5 F (36.9 C), temperature source Oral, resp. rate 19, SpO2 96 %.  General: appears older than state age, NAD, oriented to person and place Psych: Normal affect. Neuro: no focal deficits. HEENT: Normal  Neck: Supple without bruits or JVD. Lungs:  Resp regular and unlabored, CTA. Heart: RRR, +S1 +S2, no appreciable m/r/g, no JVD, no pitting LE edema Abdomen: Soft,  non-tender, non-distended, BS + x 4.  Extremities: No clubbing, cyanosis or edema. DP/PT/Radials 2+ and equal bilaterally.  Labs  No results for input(s): CKTOTAL, CKMB, TROPONINI in the last 72 hours. Lab Results  Component Value Date   WBC 11.5 (H) 12/25/2016   HGB 11.2 (L) 12/25/2016   HCT 33.0 (L) 12/25/2016   MCV 83.4 12/25/2016   PLT 317 12/25/2016    Recent Labs Lab 12/25/16 0042  NA 138  K 4.5  CL 107  BUN 41*  CREATININE 2.90*  GLUCOSE 272*   Lab Results  Component Value Date   CHOL 164 08/11/2015   HDL 34 (L) 08/11/2015   LDLCALC 111 (H) 08/11/2015   TRIG 94 08/11/2015   Lab Results  Component Value Date   DDIMER 0.85 (H) 04/10/2015     Radiology/Studies  Dg Chest 2 View  Result Date: 12/25/2016 CLINICAL DATA:  Chest pain, onset at 20:00. No relief from home nitroglycerin. EXAM: CHEST  2 VIEW COMPARISON:  08/11/2015 FINDINGS: The lungs are clear. The pulmonary vasculature is normal. Heart size is normal. Hilar and mediastinal contours are unremarkable. There is no pleural effusion. IMPRESSION: No active cardiopulmonary disease. Electronically Signed   By: Andreas Newport M.D.   On: 12/25/2016 01:24    ECG Per my interpretation tonight's tracing shows sinus tachycardia with normal axis and normal intervals, PVC, early R-S transition, mild diffuse repolarization abnormalities that are unchanged from prior  Jenna Brown is a 66 y.o. female with a history significant for multivessel-CAD deemed to be a poor candidate for surgical or percutaneous revascularization, ICM leading to HFrEF (LVEF=20%, IDDM, HTN, dyslipidemia, prior CVA, and s/p right BKA who presented with chest discomfort in the setting of SBP>2100mmHg. She is currently CP free and resting comfortably.  She has been lost to f/u from a cardiology perspective since her discharge over a year ago, and I strongly suspect poor adherence to her currently documented ambulatory medical  regimen.  A nitro gtt was started in the ED prior to my arrival for  simultaneous relief of SSCP and management of CP.  I have asked the ED nurse to give a dose of the pt's scheduled carvedilol now, and then will restart her home Imdur in the am while holding both losartan and lasix in the setting of AKI.  In regards to her elevated troponin, lower clinical suspicion for an acute plaque rupture event at this time and favor demand ischemia in the setting of her elevated BP.  That being said will continue her home DAPT with ASA and ticagrelor and will continue her heparin gtt for now.  # Hypertensive emergency: no evidence of neuro complication but pt with elevated troponin and AKI - nitro gtt started in the ED  - coreg 6.25mg  PO x 1 now - imdur 30mg  PO in am - hold lasix and losartan in the setting of AKI - consider starting hydralazine 25mg  PO TID in the am  # NSTEMI; favor demand ischemia over acute plaque rupture event - management of HTN as per above - continue home ASA and ticagrelor - heparin gtt  # Dyslipidemia - continue rosuvastatin  # AKI; reduced GFR in the setting of HTN emergency - hold ARB and loop diuretic - UA  - urine lytes  # IDDM - continue home basal insulin - SSI TIDAC and HS - A1C in am  Signed, Clayborne Dana, MD 12/25/2016, 1:41 AM

## 2016-12-25 NOTE — ED Provider Notes (Signed)
[facility] DEPT Provider Note   CSN: [id number] Arrival date & time: 12/25/16  0009  By signing my name below, I, Oleh Genin, attest that this documentation has been prepared under the direction and in the presence of Satine Hausner, MD. Electronically Signed: Oleh Genin, Scribe. 12/25/16. 12:38 AM.   History   Chief Complaint Chief Complaint  Patient presents with  . Chest Pain    HPI Jenna Brown is a 66 y.o. female with history of HTN, HLD, CVA, diabetes, and prior L AKA who presents to the ED for evaluation of chest pain. This patient states that at 2000 yesterday evening she developed L sided chest pain which has been constant. Non-radiating. She took aspirin, Tylenol, and 1x sublingual nitro at home without relief. She also is reporting generalized weakness and 1x vomiting. Also "coughing whenever she has chest pain". Denies dyspnea.   The history is provided by the patient. No language interpreter was used.  Chest Pain   This is a new problem. The current episode started 1 to 2 hours ago. The problem occurs constantly. The problem has not changed since onset.The pain is associated with rest. The pain is present in the substernal region. The pain is moderate. The quality of the pain is described as dull. The pain radiates to the left arm. Associated symptoms include vomiting. Pertinent negatives include no abdominal pain, no back pain, no cough, no fever, no palpitations and no shortness of breath. She has tried nitroglycerin for the symptoms. The treatment provided mild relief. Risk factors include being elderly.  Pertinent negatives for past medical history include no aneurysm and no seizures.  Pertinent negatives for family medical history include: no Marfan's syndrome.    Past Medical History:  Diagnosis Date  . Anxiety    takes Xanax prn  . Arthritis   . GERD (gastroesophageal reflux disease)   . H/O hiatal hernia   . Headache(784.0)    when b/p is elevated   . Hemorrhoids   . Hyperlipidemia    takes Simvastatin daily  . Hypertension    takes Amlodipine/HCTZ/Losartan daily  . Migraines   . Myocardial infarction 1990's  . Peripheral vascular disease ([place])   . PONV (postoperative nausea and vomiting)   . Stroke [facility])    TIA history  . Type II diabetes mellitus (HCC)    takes Glucovance and Januvia daily  . Vertigo    takes Ativert prn    Patient Active Problem List   Diagnosis Date Noted  . Cardiac ischemia 08/11/2015  . Chest pain 08/11/2015  . CAD in native artery   . Cardiomyopathy, ischemic 05/14/2015  . TIA (transient ischemic attack) 05/14/2015  . PAD (peripheral artery disease) ([place]) 05/14/2015  . Chronic systolic heart failure ([place]) 05/14/2015  . Hyperkalemia   . Coronary artery disease due to lipid rich plaque   . Acute systolic heart failure ([place])   . Cardiogenic shock ([place])   . Cardiac enzymes elevated   . Atelectasis   . Acute respiratory failure with hypoxia ([place])   . Elevated d-dimer   . NSTEMI (non-ST elevated myocardial infarction) ([place])   . Acute systolic CHF (congestive heart failure) ([place])   . Essential hypertension   . HLD (hyperlipidemia)   . Metabolic acidosis   . Diabetes type 2, uncontrolled ([place])   . Acute renal failure syndrome ([place])   . CHF (congestive heart failure) ([place]) 04/10/2015  . Acute respiratory failure ([place]) 04/10/2015  . Acute kidney injury ([place]) 04/10/2015  .  Acute respiratory failure with hypoxemia ([place]) 04/10/2015  . Acute pulmonary edema ([place]) 04/10/2015  . Acute exacerbation of CHF (congestive heart failure) ([place])   . Hypoxia   . Peripheral vascular disease, unspecified 12/17/2012  . Leukocytosis 05/13/2012  . Tachycardia 05/13/2012  . DMII (diabetes mellitus, type 2) ([place]) 05/11/2012  . Anemia 05/10/2012  . Anxiety 05/09/2012  . Wound dehiscence 05/02/2012  . 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 Atherosclerosis of native arteries of the  extremities with intermittent claudication 12/26/2011  . PVD (peripheral vascular disease) ([place]) 11/21/2011  . Atherosclerosis of native arteries of the extremities with rest pain 05/09/2011  . Stroke ([place])   . Hypertension   . Hyperlipidemia   . Myocardial infarction   . Peripheral vascular disease [facility])     Past Surgical History:  Procedure Laterality Date  . ABDOMINAL AORTAGRAM N/A 02/29/2012   Procedure: ABDOMINAL Maxcine Ham;  Surgeon: [name], MD;  Location: [facility] C[facility] LAB;  Service: Cardiovascular;  Laterality: N/A;  . AMPUTATION  05/10/2012   Procedure: AMPUTATION ABOVE KNEE;  Surgeon: [name], MD;  Location: [place] OR;  Service: Vascular;  Laterality: Right;   open right groin wound noted  . aortogram  02/2012  . CARDIAC CATHETERIZATION N/A 04/13/2015   Procedure: Right/Left Heart Cath and Coronary Angiography;  Surgeon: [name], MD;  Location: [facility] CV LAB;  Service: Cardiovascular;  Laterality: N/A;  . CLEFT PALATE REPAIR     several  . COLONOSCOPY    . DILATION AND CURETTAGE OF UTERUS    . FEMORAL-POPLITEAL BYPASS GRAFT  04/05/2012   Procedure: BYPASS GRAFT FEMORAL-POPLITEAL ARTERY;  Surgeon: [name], MD;  Location: [facility];  Service: Vascular;  Laterality: Right;  REVISION  . FEMORAL-TIBIAL BYPASS GRAFT  03/29/2012   Procedure: BYPASS GRAFT FEMORAL-TIBIAL ARTERY;  Surgeon: [name], MD;  Location: [facility] OR;  Service: Vascular;  Laterality: Right;  Right femoral to Posterior Tibialis with composite graft of 45mm x 80 cm ringed gortex graft and saphenous vein  ,intraoperative arteriogram  . FEMOROPOPLITEAL THROMBECTOMY / EMBOLECTOMY  05/09/2012  . MYOMECTOMY    . PR VEIN BYPASS GRAFT,AORTO-FEM-POP  05/27/11   Right SFA-Below knee Pop BP  . TONSILLECTOMY    . TUBAL LIGATION  ~ 1990    OB History    No data available       Home Medications    Prior to Admission medications   Medication Sig Start Date End Date Taking? Authorizing Provider    acetaminophen (TYLENOL) 650 MG CR tablet Take 650 mg by mouth every 8 (eight) hours as needed for pain.    Historical Provider, MD  ALPRAZolam Duanne Moron) 0.25 MG tablet Take 0.25 mg by mouth 3 (three) times daily as needed for anxiety.     Historical Provider, MD  aspirin 81 MG chewable tablet Chew 81 mg by mouth daily.    Historical Provider, MD  carvedilol (COREG) 3.125 MG tablet Take 3 tablets (9.375 mg total) by mouth 2 (two) times daily with a meal. 06/18/15   Amy D Clegg, NP  digoxin (LANOXIN) 0.125 MG tablet Take 1 tablet (0.125 mg total) by mouth daily. 05/14/15   Amy D Ninfa Meeker, NP  furosemide (LASIX) 40 MG tablet Take 1 tablet (40 mg total) by mouth daily. 05/14/15   Amy D Clegg, NP  insulin glargine (LANTUS) 100 UNIT/ML injection Inject 35 Units into the skin at bedtime.     Historical Provider, MD  insulin lispro (HUMALOG) 100 UNIT/ML injection Inject 5 Units into the skin 3 (three) times daily before meals.    Historical Provider, MD  Insulin Pen Needle (AURORA PEN NEEDLES) 29G X 12MM MISC 5 Units by Does not apply route 3 (three) times daily before meals. 05/16/15   Ricard Dillon, MD  isosorbide mononitrate (IMDUR) 30 MG 24 hr tablet take 1 tablet by mouth once daily 04/18/16   Jolaine Artist, MD  losartan (COZAAR) 50 MG tablet Take 1 tablet (50 mg total) by mouth daily. 05/28/15   Amy D Ninfa Meeker, NP  meclizine (ANTIVERT) 25 MG tablet Take 25 mg by mouth daily as needed for dizziness.     Historical Provider, MD  metFORMIN (GLUCOPHAGE) 1000 MG tablet take 1 tablet by mouth twice a day 08/24/15   Tiffany L Reed, DO  nitroGLYCERIN (NITROSTAT) 0.4 MG SL tablet Place 1 tablet (0.4 mg total) under the tongue every 5 (five) minutes x 3 doses as needed for chest pain. 08/13/15   Rogelia Mire, NP  rosuvastatin (CRESTOR) 20 MG tablet Take 20 mg by mouth daily.    Historical Provider, MD  ticagrelor (BRILINTA) 90 MG TABS tablet Take 1 tablet (90 mg total) by mouth 2 (two) times daily. 08/13/15    Rogelia Mire, NP    Family History Family History  Problem Relation Age of Onset  . Cancer Mother     breast  . Diabetes Father     Social History Social History  Substance Use Topics  . Smoking status: Former Smoker    Packs/day: 0.25    Years: 40.00    Types: Cigarettes    Quit date: 02/27/2012  . Smokeless tobacco: Never Used  . Alcohol use No     Allergies   Plavix [clopidogrel bisulfate]; Codeine; Tylenol with codeine #3 [acetaminophen-codeine]; and Tylenol [acetaminophen]   Review of Systems Review of Systems  Constitutional: Negative for chills and fever.       Generalized weakness  HENT: Negative for ear pain and sore throat.   Eyes: Negative for pain and visual disturbance.  Respiratory: Negative for cough and shortness of breath.   Cardiovascular: Negative for chest pain and palpitations.  Gastrointestinal: Positive for vomiting. Negative for abdominal pain.  Genitourinary: Negative for dysuria and hematuria.  Musculoskeletal: Negative for arthralgias and back pain.  Skin: Negative for color change and rash.  Neurological: Negative for seizures and syncope.  All other systems reviewed and are negative.    Physical Exam Updated Vital Signs BP (!) 225/98 (BP Location: Right Arm)   Pulse 97   Temp 98.5 F (36.9 C) (Oral)   Resp 16   SpO2 97%   Physical Exam  Constitutional: She is oriented to person, place, and time. She appears well-developed and well-nourished.  HENT:  Head: Normocephalic and atraumatic.  Mouth/Throat: Oropharynx is clear and moist. No oropharyngeal exudate.  Eyes: EOM are normal. Pupils are equal, round, and reactive to light.  Neck: Normal range of motion. Neck supple. No JVD present. No tracheal deviation present.  Cardiovascular: Normal rate, regular rhythm, normal heart sounds and intact distal pulses.   Intact radial pulses bilaterally. Intact DP and PT pulses on Left.   Pulmonary/Chest: Effort normal and breath  sounds normal. No respiratory distress. She has no wheezes. She has no rales.  Abdominal: Soft. Bowel sounds are normal. She exhibits no distension. There is no tenderness. There is no rebound and no guarding.  Musculoskeletal: She exhibits no edema.  Left above-knee-amputation.   Neurological: She is alert and oriented to person, place, and time.  Skin: Skin is warm and dry. Capillary refill takes less than 2 seconds.  Psychiatric: She has a normal mood and affect.  Nursing note and vitals reviewed.    ED Treatments / Results   Vitals:   12/25/16 0230 12/25/16 0246  BP: (!) 146/69   Pulse: 81   Resp: 20   Temp:  98.2 F (36.8 C)  ; Labs (all labs ordered are listed, but only abnormal results are displayed)  Results for orders placed or performed during the hospital encounter of 12/25/16  CBC with Differential/Platelet  Result Value Ref Range   WBC 11.5 (H) 4.0 - 10.5 K/uL   RBC 3.98 3.87 - 5.11 MIL/uL   Hemoglobin 10.4 (L) 12.0 - 15.0 g/dL   HCT 33.2 (L) 36.0 - 46.0 %   MCV 83.4 78.0 - 100.0 fL   MCH 26.1 26.0 - 34.0 pg   MCHC 31.3 30.0 - 36.0 g/dL   RDW 12.8 11.5 - 15.5 %   Platelets 317 150 - 400 K/uL   Neutrophils Relative % 79 %   Neutro Abs 9.1 (H) 1.7 - 7.7 K/uL   Lymphocytes Relative 15 %   Lymphs Abs 1.7 0.7 - 4.0 K/uL   Monocytes Relative 6 %   Monocytes Absolute 0.6 0.1 - 1.0 K/uL   Eosinophils Relative 0 %   Eosinophils Absolute 0.1 0.0 - 0.7 K/uL   Basophils Relative 0 %   Basophils Absolute 0.0 0.0 - 0.1 K/uL  I-Stat Chem 8, ED  Result Value Ref Range   Sodium 138 135 - 145 mmol/L   Potassium 4.5 3.5 - 5.1 mmol/L   Chloride 107 101 - 111 mmol/L   BUN 41 (H) 6 - 20 mg/dL   Creatinine, Ser 2.90 (H) 0.44 - 1.00 mg/dL   Glucose, Bld 272 (H) 65 - 99 mg/dL   Calcium, Ion 1.09 (L) 1.15 - 1.40 mmol/L   TCO2 25 0 - 100 mmol/L   Hemoglobin 11.2 (L) 12.0 - 15.0 g/dL   HCT 33.0 (L) 36.0 - 46.0 %  I-stat troponin, ED  Result Value Ref Range   Troponin i,  poc 0.53 (HH) 0.00 - 0.08 ng/mL   Comment NOTIFIED PHYSICIAN    Comment 3           Dg Chest 2 View  Result Date: 12/25/2016 CLINICAL DATA:  Chest pain, onset at 20:00. No relief from home nitroglycerin. EXAM: CHEST  2 VIEW COMPARISON:  08/11/2015 FINDINGS: The lungs are clear. The pulmonary vasculature is normal. Heart size is normal. Hilar and mediastinal contours are unremarkable. There is no pleural effusion. IMPRESSION: No active cardiopulmonary disease. Electronically Signed   By: Andreas Newport M.D.   On: 12/25/2016 01:24    EKG  EKG Interpretation  Date/Time:  Sunday Lillien Petronio 08 2018 00:10:15 EDT Ventricular Rate:  100 PR Interval:    QRS Duration: 103 QT Interval:  348 QTC Calculation: 449 R Axis:   66 Text Interpretation:  Sinus tachycardia Ventricular premature complex diffuse st t changes similar to previous Confirmed by [facility]  MD, Christyna Letendre ([id number]) on 12/25/2016 12:18:42 AM       Radiology No results found.  Procedures Procedures (including critical care time)  Medications Ordered in ED  Medications  nitroGLYCERIN 50 mg in dextrose 5 % 250 mL (0.2 mg/mL) infusion (10 mcg/min Intravenous New Bag/Given 12/25/16 0110)  carvedilol (COREG) tablet 6.25 mg (6.25 mg Oral  Given 12/25/16 0229)     Final Clinical Impressions(s) / ED Diagnoses  Angina: will admit to cardiology    I personally performed the services described in this documentation, which was scribed in my presence. The recorded information has been reviewed and is accurate.      Veatrice Kells, MD 12/25/16 [phone]

## 2016-12-25 NOTE — Consult Note (Addendum)
Renal Service Consult Note [facility]  Jenna Brown 12/25/2016 [place] Requesting Physician:  Dr Wynonia Lawman  Reason for Consult:  Renal failure HPI: The patient is a 66 y.o. year-old with history of severe CAD, DM2, HTN, HL, DJD, PAD w L BKA, who presented to ED today with crushing chest pain onset late morning.  BP per EMS was 260/130, in ED BP was 180/90.  CXR was clear.  Creat was up at 2.6, the last creat here was 1.3 in Nov 2016.  Asked to see for CKD.   Patient says she sees a kidney doctor in [facility], doesn't know her name though.  She takes 40 mg lasix once daily.  She feels "a lot better" than when she arrived earlier today.  SOB improved, no further CP.  BP's are better.    Old chart Sept 12 - R fem- pop bypass for ischemic R toe Jun 13 - occluded R fem-pop, underwent thrombectomy and revision Aug 13 - nonhealing R leg wound, underwent R AKA, dc'd to SNF Aug 16 - acute SOB, ^trop > underwent LHC showing 3-vessel disease (90% LAD, 90% LCX and 80% RCA) and ischemic CM EF 20%.  Had acute systolic CHF w ischemic WMA's, and cardiogenic shock, rx milrinone, lasix.  seen by surgery but not candidate for CABG nor for repeat cath/ PCI.   Nov 16 - acute NSTEMI, medically managed. Hx CVA. L AKA.  Chronic syst CHF.    ROS  denies CP  no joint pain   no HA  no blurry vision  no rash  no diarrhea  no nausea/ vomiting  no dysuria  no difficulty voiding  no change in urine color    Past Medical History  Past Medical History:  Diagnosis Date  . Anxiety    takes Xanax prn  . Arthritis   . GERD (gastroesophageal reflux disease)   . H/O hiatal hernia   . Headache(784.0)    when b/p is elevated  . Hemorrhoids   . Hyperlipidemia    takes Simvastatin daily  . Hypertension    takes Amlodipine/HCTZ/Losartan daily  . Migraines   . Myocardial infarction 1990's  . Peripheral vascular disease ([place])   . PONV (postoperative nausea and vomiting)   . Stroke [facility])     TIA history  . Type II diabetes mellitus (HCC)    takes Glucovance and Januvia daily  . Vertigo    takes Ativert prn   Past Surgical History  Past Surgical History:  Procedure Laterality Date  . ABDOMINAL AORTAGRAM N/A 02/29/2012   Procedure: ABDOMINAL Maxcine Ham;  Surgeon: [name], MD;  Location: [facility] CATH LAB;  Service: Cardiovascular;  Laterality: N/A;  . AMPUTATION  05/10/2012   Procedure: AMPUTATION ABOVE KNEE;  Surgeon: [name], MD;  Location: [facility] OR;  Service: Vascular;  Laterality: Right;   open right groin wound noted  . aortogram  02/2012  . CARDIAC CATHETERIZATION N/A 04/13/2015   Procedure: Right/Left Heart Cath and Coronary Angiography;  Surgeon: [name], MD;  Location: [place] CV LAB;  Service: Cardiovascular;  Laterality: N/A;  . CLEFT PALATE REPAIR     several  . COLONOSCOPY    . DILATION AND CURETTAGE OF UTERUS    . FEMORAL-POPLITEAL BYPASS GRAFT  04/05/2012   Procedure: BYPASS GRAFT FEMORAL-POPLITEAL ARTERY;  Surgeon: [name], MD;  Location: [place];  Service: Vascular;  Laterality: Right;  REVISION  . FEMORAL-TIBIAL BYPASS GRAFT  03/29/2012   Procedure:  BYPASS GRAFT FEMORAL-TIBIAL ARTERY;  Surgeon: [name], MD;  Location: [place] OR;  Service: Vascular;  Laterality: Right;  Right femoral to Posterior Tibialis with composite graft of 58mm x 80 cm ringed gortex graft and saphenous vein  ,intraoperative arteriogram  . FEMOROPOPLITEAL THROMBECTOMY / EMBOLECTOMY  05/09/2012  . MYOMECTOMY    . PR VEIN BYPASS GRAFT,AORTO-FEM-POP  05/27/11   Right SFA-Below knee Pop BP  . TONSILLECTOMY    . TUBAL LIGATION  ~ 1990   Family History  Family History  Problem Relation Age of Onset  . Cancer Mother     breast  . Diabetes Father    Social History  reports that she quit smoking about 4 years ago. Her smoking use included Cigarettes. She has a 10.00 pack-year smoking history. She has never used smokeless tobacco. She reports that she does not drink  alcohol or use drugs. Allergies  Allergies  Allergen Reactions  . Plavix [Clopidogrel Bisulfate] Itching  . Codeine Other (See Comments)    "makes me feel strange"  . Tylenol With Codeine #3 [Acetaminophen-Codeine] Other (See Comments)    "doesn't make me feel right"  . Tylenol [Acetaminophen] Other (See Comments)    "Doesn't feel right"    Home medications Prior to Admission medications   Medication Sig Start Date End Date Taking? Authorizing Provider  acetaminophen (TYLENOL) 650 MG CR tablet Take 650 mg by mouth at bedtime.    Yes Historical Provider, MD  ALPRAZolam (XANAX) 0.25 MG tablet Take 0.25 mg by mouth 3 (three) times daily as needed for anxiety.    Yes Historical Provider, MD  aspirin 81 MG chewable tablet Chew 81 mg by mouth daily.   Yes Historical Provider, MD  digoxin (LANOXIN) 0.125 MG tablet Take 1 tablet (0.125 mg total) by mouth daily. 05/14/15  Yes Amy D Clegg, NP  furosemide (LASIX) 40 MG tablet Take 1 tablet (40 mg total) by mouth daily. 05/14/15  Yes Amy D Clegg, NP  insulin glargine (LANTUS) 100 UNIT/ML injection Inject 25 Units into the skin 2 (two) times daily.    Yes Historical Provider, MD  insulin lispro (HUMALOG) 100 UNIT/ML injection Inject 5 Units into the skin 3 (three) times daily before meals.   Yes Historical Provider, MD  Insulin Pen Needle (AURORA PEN NEEDLES) 29G X 12MM MISC 5 Units by Does not apply route 3 (three) times daily before meals. 05/16/15  Yes Ricard Dillon, MD  isosorbide mononitrate (IMDUR) 30 MG 24 hr tablet take 1 tablet by mouth once daily 04/18/16  Yes Jolaine Artist, MD  losartan (COZAAR) 100 MG tablet Take 100 mg by mouth daily.   Yes Historical Provider, MD  metoprolol succinate (TOPROL-XL) 25 MG 24 hr tablet Take 25 mg by mouth daily.   Yes Historical Provider, MD  nitroGLYCERIN (NITROSTAT) 0.4 MG SL tablet Place 1 tablet (0.4 mg total) under the tongue every 5 (five) minutes x 3 doses as needed for chest pain. 08/13/15  Yes  Rogelia Mire, NP  pravastatin (PRAVACHOL) 40 MG tablet Take 40 mg by mouth daily.   Yes Historical Provider, MD   Liver Function Tests No results for input(s): AST, ALT, ALKPHOS, BILITOT, PROT, ALBUMIN in the last 168 hours. No results for input(s): LIPASE, AMYLASE in the last 168 hours. CBC  Recent Labs Lab 12/25/16 0027 12/25/16 0042 12/25/16 0449  WBC 11.5*  --  11.7*  NEUTROABS 9.1*  --   --   HGB 10.4* 11.2* 9.7*  HCT  33.2* 33.0* 31.5*  MCV 83.4  --  82.7  PLT 317  --  765   Basic Metabolic Panel  Recent Labs Lab 12/25/16 0042 12/25/16 0449  NA 138 139  K 4.5 4.5  CL 107 105  CO2  --  23  GLUCOSE 272* 197*  BUN 41* 46*  CREATININE 2.90* 2.60*  CALCIUM  --  8.6*   Iron/TIBC/Ferritin/ %Sat No results found for: IRON, TIBC, FERRITIN, IRONPCTSAT  Vitals:   12/25/16 1623 12/25/16 2025 12/25/16 2053 12/25/16 2100  BP: 125/73  (!) 130/52   Pulse:  79 72   Resp: 20 (!) 25 (!) 24   Temp: 98 F (36.7 C)   98.2 F (36.8 C)  TempSrc: Oral   Oral  SpO2: 100% 97% 96%   Weight:      Height:       Exam Gen alert, elderly AAF, looks older than stated age No rash, cyanosis or gangrene Sclera anicteric, throat clear  No jvd or bruits Chest clear bilat RRR no MRG, quiet precordium Abd soft ntnd no mass or ascites +bs obese GU defer MS no joint effusions or deformity Ext LLE 2+ pitting edema, R AKA trace edema No LE wounds or ulcers Neuro is alert, Ox 3 , nf, no asterixis    Assessment: 1. Renal failure - acute on chronic vs progression of CKD over time.  Last creat here was 1.3 in NOv 2016.  Could be progression which would make this CKD IV if that's true.  Sees a "kidney doctor" in [place].   2. Vol excess - sig LE edema, CXR clear 3. Severe multi-vessel CAD - deemed not surgical/ PCI candidate in 2016 4. DM2 5. HTN'sive crisis - rx'd with IV NTG and po BP medications.  Better now. OK to continue Cozaar/ Toprol as home meds.   6. Severe ICM - EF  20% in 2016   Plan - would give some IV lasix for 24 hrs or so. May need ^'d maint po dose of lasix.  Has renal MD in [facility].  Get renal US.  Will follow.   Kelly Splinter MD [employer] pager [phone]   12/25/2016, 9:46 PM

## 2016-12-25 NOTE — Progress Notes (Signed)
ANTICOAGULATION CONSULT NOTE - Initial Consult  Pharmacy Consult for heparin Indication: NSTEMI  Allergies  Allergen Reactions  . Plavix [Clopidogrel Bisulfate] Itching  . Codeine Other (See Comments)    "makes me feel strange"  . Tylenol With Codeine #3 [Acetaminophen-Codeine] Other (See Comments)    "doesn't make me feel right"  . Tylenol [Acetaminophen] Other (See Comments)    "Doesn't feel right"     Patient Measurements: Height: 5\' 4"  (162.6 cm) Weight: 158 lb 8 oz (71.9 kg) IBW/kg (Calculated) : 54.7 Heparin Dosing Weight: 70kg  Vital Signs: Temp: 98 F (36.7 C) (04/08 0729) Temp Source: Oral (04/08 0729) BP: 166/74 (04/08 0853) Pulse Rate: 83 (04/08 0918)  Labs:  Recent Labs  12/25/16 0027 12/25/16 0042 12/25/16 0449 12/25/16 0941  HGB 10.4* 11.2* 9.7*  --   HCT 33.2* 33.0* 31.5*  --   PLT 317  --  319  --   HEPARINUNFRC  --   --   --  0.29*  CREATININE  --  2.90* 2.60*  --   TROPONINI  --   --  4.53* 4.62*    Estimated Creatinine Clearance: 21.5 mL/min (A) (by C-G formula based on SCr of 2.6 mg/dL (H)).   Medical History: Past Medical History:  Diagnosis Date  . Anxiety    takes Xanax prn  . Arthritis   . GERD (gastroesophageal reflux disease)   . H/O hiatal hernia   . Headache(784.0)    when b/p is elevated  . Hemorrhoids   . Hyperlipidemia    takes Simvastatin daily  . Hypertension    takes Amlodipine/HCTZ/Losartan daily  . Migraines   . Myocardial infarction 1990's  . Peripheral vascular disease ([place])   . PONV (postoperative nausea and vomiting)   . Stroke [facility])    TIA history  . Type II diabetes mellitus (HCC)    takes Glucovance and Januvia daily  . Vertigo    takes Ativert prn    Medications:  Prescriptions Prior to Admission  Medication Sig Dispense Refill Last Dose  . acetaminophen (TYLENOL) 650 MG CR tablet Take 650 mg by mouth at bedtime.    12/24/2016 at Unknown time  . ALPRAZolam (XANAX) 0.25 MG tablet Take 0.25 mg by  mouth 3 (three) times daily as needed for anxiety.    12/24/2016 at Unknown time  . aspirin 81 MG chewable tablet Chew 81 mg by mouth daily.   12/24/2016 at Unknown time  . digoxin (LANOXIN) 0.125 MG tablet Take 1 tablet (0.125 mg total) by mouth daily. 90 tablet 3 12/24/2016 at Unknown time  . furosemide (LASIX) 40 MG tablet Take 1 tablet (40 mg total) by mouth daily. 90 tablet 3 12/24/2016 at Unknown time  . insulin glargine (LANTUS) 100 UNIT/ML injection Inject 25 Units into the skin 2 (two) times daily.    12/24/2016 at Unknown time  . insulin lispro (HUMALOG) 100 UNIT/ML injection Inject 5 Units into the skin 3 (three) times daily before meals.   12/24/2016 at Unknown time  . Insulin Pen Needle (AURORA PEN NEEDLES) 29G X 12MM MISC 5 Units by Does not apply route 3 (three) times daily before meals. 100 each 0 12/24/2016  . isosorbide mononitrate (IMDUR) 30 MG 24 hr tablet take 1 tablet by mouth once daily 30 tablet 6 12/24/2016 at Unknown time  . losartan (COZAAR) 100 MG tablet Take 100 mg by mouth daily.   12/24/2016 at Unknown time  . metoprolol succinate (TOPROL-XL) 25 MG 24 hr tablet Take 25  mg by mouth daily.   12/24/2016 at 0800  . nitroGLYCERIN (NITROSTAT) 0.4 MG SL tablet Place 1 tablet (0.4 mg total) under the tongue every 5 (five) minutes x 3 doses as needed for chest pain. 25 tablet 3 unknown  . pravastatin (PRAVACHOL) 40 MG tablet Take 40 mg by mouth daily.   12/24/2016 at Unknown time   Infusions:  . heparin 900 Units/hr (12/25/16 0351)  . nitroGLYCERIN 10 mcg/min (12/25/16 0550)    Assessment: 66yo female admitted w/ hypertensive emergency, to begin heparin for NSTEMI. Per notes, patient with known CAD, but has previously been a poor candidate for surgery/intervention. Pharmacy consulted to dose heparin gtt. Heparin level slightly subtherapeutic at 0.29 and was drawn a few hours early. Hgb slightly low, but stable. Platelets normal. No s/s bleeding.   Goal of Therapy:  Heparin level 0.3-0.7  units/ml Monitor platelets by anticoagulation protocol: Yes   Plan:  Increase heparin gtt to 1050 units/hr Heparin level in 8 hours Heparin level and CBC daily Monitor for s/s bleeding  Argie Ramming, PharmD Pharmacy Resident  Pager [phone] 12/25/16 12:10 PM

## 2016-12-25 NOTE — ED Triage Notes (Signed)
Brought by ems from home.  Pt reports watching tv and having sudden onset of crushing chest pain in center of chest.  Took 1NTG and Asa 324mg  at home with pain decreasing from 10 to 8.  Given second NTG by ems with pain relief reported.  BP 260/130 when ems first arrived.  Down to 180/90 on arrival at hospital.  Daughter reports patients bp has been up for the last two weeks but has not had follow up appointment yet.

## 2016-12-25 NOTE — ED Notes (Signed)
Patient transported to X-ray 

## 2016-12-25 NOTE — Progress Notes (Signed)
Subjective:  Admitted earlier today with with hypertensive crisis.  She has not been taking some of her medications.  Admitted with chest discomfort and elevation as of both troponin and also had significant renal failure.  Feels considerably better.  No chest pain did have arm pain last night.    Objective:  Vital Signs in the last 24 hours: BP (!) 166/74   Pulse 83   Temp 98 F (36.7 C) (Oral)   Resp (!) 25   Ht 5\' 4"  (1.626 m)   Wt 71.9 kg (158 lb 8 oz)   SpO2 97%   BMI 27.21 kg/m   Physical Exam: Elderly obese female currently in no acute distress Lungs:  Clear Cardiac:  Regular rhythm, normal S1 and S2, no S3 Abdomen:  Soft, nontender, no masses Extremities:  No edema present  Intake/Output from previous day: 04/07 0701 - 04/08 0700 In: 23 [I.V.:23] Out: 300 [Urine:300]  Weight Filed Weights   12/25/16 0246  Weight: 71.9 kg (158 lb 8 oz)    Lab Results: Basic Metabolic Panel:  Recent Labs  12/25/16 0042 12/25/16 0449  NA 138 139  K 4.5 4.5  CL 107 105  CO2  --  23  GLUCOSE 272* 197*  BUN 41* 46*  CREATININE 2.90* 2.60*   CBC:  Recent Labs  12/25/16 0027 12/25/16 0042 12/25/16 0449  WBC 11.5*  --  11.7*  NEUTROABS 9.1*  --   --   HGB 10.4* 11.2* 9.7*  HCT 33.2* 33.0* 31.5*  MCV 83.4  --  82.7  PLT 317  --  319   Cardiac Enzymes: Troponin (Point of Care Test)  Recent Labs  12/25/16 0040  TROPIPOC 0.53*   Cardiac Panel (last 3 results)  Recent Labs  12/25/16 0449 12/25/16 0941  TROPONINI 4.53* 4.62*    Telemetry: Sinus rhythm  Assessment/Plan:  1.  Hypertensive emergency clinically better but blood pressure still elevated 2.  Non-STEMI 3.  Anemia 4.  Stage IV chronic kidney disease  Recommendations:  Obtain renal consult and add hydralazine 25 3 times a day.  Suspect compliance is an issue here.  In light of renal insufficiency likely best to treat medically.     Kerry Hough  MD [facility]  12/25/2016,  10:50 AM

## 2016-12-25 NOTE — Progress Notes (Signed)
ANTICOAGULATION CONSULT NOTE - Initial Consult  Pharmacy Consult for heparin Indication: NSTEMI  Allergies  Allergen Reactions  . Plavix [Clopidogrel Bisulfate] Itching  . Codeine Other (See Comments)    "makes me feel strange"  . Tylenol With Codeine #3 [Acetaminophen-Codeine] Other (See Comments)    "doesn't make me feel right"  . Tylenol [Acetaminophen] Other (See Comments)    "Doesn't feel right"     Patient Measurements: Height: 5\' 4"  (162.6 cm) Weight: 158 lb 8 oz (71.9 kg) IBW/kg (Calculated) : 54.7 Heparin Dosing Weight: 70kg  Vital Signs: Temp: 98.2 F (36.8 C) (04/08 0246) Temp Source: Oral (04/08 0246) BP: 167/66 (04/08 0215) Pulse Rate: 86 (04/08 0215)  Labs:  Recent Labs  12/25/16 0027 12/25/16 0042  HGB 10.4* 11.2*  HCT 33.2* 33.0*  PLT 317  --   CREATININE  --  2.90*    Estimated Creatinine Clearance: 19.3 mL/min (A) (by C-G formula based on SCr of 2.9 mg/dL (H)).   Medical History: Past Medical History:  Diagnosis Date  . Anxiety    takes Xanax prn  . Arthritis   . GERD (gastroesophageal reflux disease)   . H/O hiatal hernia   . Headache(784.0)    when b/p is elevated  . Hemorrhoids   . Hyperlipidemia    takes Simvastatin daily  . Hypertension    takes Amlodipine/HCTZ/Losartan daily  . Migraines   . Myocardial infarction 1990's  . Peripheral vascular disease ([place])   . PONV (postoperative nausea and vomiting)   . Stroke [facility])    TIA history  . Type II diabetes mellitus (HCC)    takes Glucovance and Januvia daily  . Vertigo    takes Ativert prn    Medications:  Prescriptions Prior to Admission  Medication Sig Dispense Refill Last Dose  . acetaminophen (TYLENOL) 650 MG CR tablet Take 650 mg by mouth at bedtime.    12/24/2016 at Unknown time  . ALPRAZolam (XANAX) 0.25 MG tablet Take 0.25 mg by mouth 3 (three) times daily as needed for anxiety.    12/24/2016 at Unknown time  . aspirin 81 MG chewable tablet Chew 81 mg by mouth daily.    12/24/2016 at Unknown time  . digoxin (LANOXIN) 0.125 MG tablet Take 1 tablet (0.125 mg total) by mouth daily. 90 tablet 3 12/24/2016 at Unknown time  . furosemide (LASIX) 40 MG tablet Take 1 tablet (40 mg total) by mouth daily. 90 tablet 3 12/24/2016 at Unknown time  . insulin glargine (LANTUS) 100 UNIT/ML injection Inject 25 Units into the skin 2 (two) times daily.    12/24/2016 at Unknown time  . insulin lispro (HUMALOG) 100 UNIT/ML injection Inject 5 Units into the skin 3 (three) times daily before meals.   12/24/2016 at Unknown time  . Insulin Pen Needle (AURORA PEN NEEDLES) 29G X 12MM MISC 5 Units by Does not apply route 3 (three) times daily before meals. 100 each 0 12/24/2016  . isosorbide mononitrate (IMDUR) 30 MG 24 hr tablet take 1 tablet by mouth once daily 30 tablet 6 12/24/2016 at Unknown time  . losartan (COZAAR) 100 MG tablet Take 100 mg by mouth daily.   12/24/2016 at Unknown time  . metoprolol succinate (TOPROL-XL) 25 MG 24 hr tablet Take 25 mg by mouth daily.   12/24/2016 at 0800  . nitroGLYCERIN (NITROSTAT) 0.4 MG SL tablet Place 1 tablet (0.4 mg total) under the tongue every 5 (five) minutes x 3 doses as needed for chest pain. 25 tablet 3 unknown  .  pravastatin (PRAVACHOL) 40 MG tablet Take 40 mg by mouth daily.   12/24/2016 at Unknown time   Infusions:  . nitroGLYCERIN 10 mcg/min (12/25/16 0110)    Assessment: 66yo female admitted w/ hypertensive emergency, to begin heparin for NSTEMI.  Goal of Therapy:  Heparin level 0.3-0.7 units/ml Monitor platelets by anticoagulation protocol: Yes   Plan:  Will give heparin 3000 units IV bolus x1 followed by gtt at 900 units/hr and monitor heparin levels and CBC.  Wynona Neat, PharmD, BCPS  12/25/2016,2:51 AM

## 2016-12-25 NOTE — ED Notes (Signed)
Admitting MD at bedside.

## 2016-12-25 NOTE — Progress Notes (Signed)
ANTICOAGULATION CONSULT NOTE - f/u Consult  Pharmacy Consult for heparin Indication: NSTEMI  Allergies  Allergen Reactions  . Plavix [Clopidogrel Bisulfate] Itching  . Codeine Other (See Comments)    "makes me feel strange"  . Tylenol With Codeine #3 [Acetaminophen-Codeine] Other (See Comments)    "doesn't make me feel right"  . Tylenol [Acetaminophen] Other (See Comments)    "Doesn't feel right"     Patient Measurements: Height: 5\' 4"  (162.6 cm) Weight: 158 lb 8 oz (71.9 kg) IBW/kg (Calculated) : 54.7 Heparin Dosing Weight: 70kg  Vital Signs: Temp: 98 F (36.7 C) (04/08 1623) Temp Source: Oral (04/08 1623) BP: 130/52 (04/08 2053) Pulse Rate: 72 (04/08 2053)  Labs:  Recent Labs  12/25/16 0027 12/25/16 0042 12/25/16 0449 12/25/16 0941 12/25/16 1508 12/25/16 1953  HGB 10.4* 11.2* 9.7*  --   --   --   HCT 33.2* 33.0* 31.5*  --   --   --   PLT 317  --  319  --   --   --   HEPARINUNFRC  --   --   --  0.29*  --  0.44  CREATININE  --  2.90* 2.60*  --   --   --   TROPONINI  --   --  4.53* 4.62* 4.61*  --     Estimated Creatinine Clearance: 21.5 mL/min (A) (by C-G formula based on SCr of 2.6 mg/dL (H)).   Medical History: Past Medical History:  Diagnosis Date  . Anxiety    takes Xanax prn  . Arthritis   . GERD (gastroesophageal reflux disease)   . H/O hiatal hernia   . Headache(784.0)    when b/p is elevated  . Hemorrhoids   . Hyperlipidemia    takes Simvastatin daily  . Hypertension    takes Amlodipine/HCTZ/Losartan daily  . Migraines   . Myocardial infarction 1990's  . Peripheral vascular disease ([place])   . PONV (postoperative nausea and vomiting)   . Stroke [facility])    TIA history  . Type II diabetes mellitus (HCC)    takes Glucovance and Januvia daily  . Vertigo    takes Ativert prn    Medications:  Prescriptions Prior to Admission  Medication Sig Dispense Refill Last Dose  . acetaminophen (TYLENOL) 650 MG CR tablet Take 650 mg by mouth at  bedtime.    12/24/2016 at Unknown time  . ALPRAZolam (XANAX) 0.25 MG tablet Take 0.25 mg by mouth 3 (three) times daily as needed for anxiety.    12/24/2016 at Unknown time  . aspirin 81 MG chewable tablet Chew 81 mg by mouth daily.   12/24/2016 at Unknown time  . digoxin (LANOXIN) 0.125 MG tablet Take 1 tablet (0.125 mg total) by mouth daily. 90 tablet 3 12/24/2016 at Unknown time  . furosemide (LASIX) 40 MG tablet Take 1 tablet (40 mg total) by mouth daily. 90 tablet 3 12/24/2016 at Unknown time  . insulin glargine (LANTUS) 100 UNIT/ML injection Inject 25 Units into the skin 2 (two) times daily.    12/24/2016 at Unknown time  . insulin lispro (HUMALOG) 100 UNIT/ML injection Inject 5 Units into the skin 3 (three) times daily before meals.   12/24/2016 at Unknown time  . Insulin Pen Needle (AURORA PEN NEEDLES) 29G X 12MM MISC 5 Units by Does not apply route 3 (three) times daily before meals. 100 each 0 12/24/2016  . isosorbide mononitrate (IMDUR) 30 MG 24 hr tablet take 1 tablet by mouth once daily 30 tablet  6 12/24/2016 at Unknown time  . losartan (COZAAR) 100 MG tablet Take 100 mg by mouth daily.   12/24/2016 at Unknown time  . metoprolol succinate (TOPROL-XL) 25 MG 24 hr tablet Take 25 mg by mouth daily.   12/24/2016 at 0800  . nitroGLYCERIN (NITROSTAT) 0.4 MG SL tablet Place 1 tablet (0.4 mg total) under the tongue every 5 (five) minutes x 3 doses as needed for chest pain. 25 tablet 3 unknown  . pravastatin (PRAVACHOL) 40 MG tablet Take 40 mg by mouth daily.   12/24/2016 at Unknown time   Infusions:  . heparin 1,050 Units/hr (12/25/16 1900)  . nitroGLYCERIN 10 mcg/min (12/25/16 1900)    Assessment: 66yo female admitted w/ hypertensive emergency, to begin heparin for NSTEMI. Per notes, patient with known CAD, but has previously been a poor candidate for surgery/intervention. - PM HL 0.44 now in goal range.  Goal of Therapy:  Heparin level 0.3-0.7 units/ml Monitor platelets by anticoagulation protocol: Yes    Plan:  Continue heparin gtt at 1050 units/hr Heparin level and CBC daily Monitor for s/s bleeding  Devion Chriscoe S. Alford Highland, PharmD, [facility] Clinical Staff Pharmacist Pager [phone]  12/25/16 9:04 PM

## 2016-12-26 ENCOUNTER — Inpatient Hospital Stay (HOSPITAL_COMMUNITY): Payer: Medicare Other

## 2016-12-26 DIAGNOSIS — I214 Non-ST elevation (NSTEMI) myocardial infarction: Secondary | ICD-10-CM

## 2016-12-26 DIAGNOSIS — I2510 Atherosclerotic heart disease of native coronary artery without angina pectoris: Secondary | ICD-10-CM

## 2016-12-26 DIAGNOSIS — I200 Unstable angina: Secondary | ICD-10-CM

## 2016-12-26 DIAGNOSIS — E784 Other hyperlipidemia: Secondary | ICD-10-CM

## 2016-12-26 DIAGNOSIS — I255 Ischemic cardiomyopathy: Secondary | ICD-10-CM

## 2016-12-26 DIAGNOSIS — I5021 Acute systolic (congestive) heart failure: Secondary | ICD-10-CM

## 2016-12-26 DIAGNOSIS — I10 Essential (primary) hypertension: Secondary | ICD-10-CM

## 2016-12-26 DIAGNOSIS — E1165 Type 2 diabetes mellitus with hyperglycemia: Secondary | ICD-10-CM

## 2016-12-26 DIAGNOSIS — N184 Chronic kidney disease, stage 4 (severe): Secondary | ICD-10-CM

## 2016-12-26 DIAGNOSIS — E7800 Pure hypercholesterolemia, unspecified: Secondary | ICD-10-CM

## 2016-12-26 DIAGNOSIS — N189 Chronic kidney disease, unspecified: Secondary | ICD-10-CM | POA: Diagnosis present

## 2016-12-26 DIAGNOSIS — E1121 Type 2 diabetes mellitus with diabetic nephropathy: Secondary | ICD-10-CM

## 2016-12-26 DIAGNOSIS — R748 Abnormal levels of other serum enzymes: Secondary | ICD-10-CM

## 2016-12-26 DIAGNOSIS — I161 Hypertensive emergency: Secondary | ICD-10-CM

## 2016-12-26 DIAGNOSIS — N179 Acute kidney failure, unspecified: Secondary | ICD-10-CM

## 2016-12-26 DIAGNOSIS — E118 Type 2 diabetes mellitus with unspecified complications: Secondary | ICD-10-CM

## 2016-12-26 LAB — CBC
HEMATOCRIT: 29.1 % — AB (ref 36.0–46.0)
HEMOGLOBIN: 9 g/dL — AB (ref 12.0–15.0)
MCH: 25.7 pg — AB (ref 26.0–34.0)
MCHC: 30.9 g/dL (ref 30.0–36.0)
MCV: 83.1 fL (ref 78.0–100.0)
Platelets: 299 10*3/uL (ref 150–400)
RBC: 3.5 MIL/uL — ABNORMAL LOW (ref 3.87–5.11)
RDW: 13.1 % (ref 11.5–15.5)
WBC: 9.5 10*3/uL (ref 4.0–10.5)

## 2016-12-26 LAB — BASIC METABOLIC PANEL
Anion gap: 11 (ref 5–15)
BUN: 50 mg/dL — AB (ref 6–20)
CHLORIDE: 104 mmol/L (ref 101–111)
CO2: 23 mmol/L (ref 22–32)
CREATININE: 3.24 mg/dL — AB (ref 0.44–1.00)
Calcium: 8.5 mg/dL — ABNORMAL LOW (ref 8.9–10.3)
GFR calc Af Amer: 16 mL/min — ABNORMAL LOW (ref >60)
GFR, EST NON AFRICAN AMERICAN: 14 mL/min — AB (ref >60)
Glucose, Bld: 186 mg/dL — ABNORMAL HIGH (ref 65–99)
Potassium: 3.9 mmol/L (ref 3.5–5.1)
Sodium: 138 mmol/L (ref 135–145)

## 2016-12-26 LAB — GLUCOSE, CAPILLARY
Glucose-Capillary: 138 mg/dL — ABNORMAL HIGH (ref 65–99)
Glucose-Capillary: 227 mg/dL — ABNORMAL HIGH (ref 65–99)
Glucose-Capillary: 117 mg/dL — ABNORMAL HIGH (ref 65–99)
Glucose-Capillary: 193 mg/dL — ABNORMAL HIGH (ref 65–99)

## 2016-12-26 LAB — HEPARIN LEVEL (UNFRACTIONATED): Heparin Unfractionated: 0.45 IU/mL (ref 0.30–0.70)

## 2016-12-26 LAB — HEMOGLOBIN A1C
Hgb A1c MFr Bld: 8.2 % — ABNORMAL HIGH (ref 4.8–5.6)
Mean Plasma Glucose: 189 mg/dL

## 2016-12-26 MED ORDER — ROSUVASTATIN CALCIUM 10 MG PO TABS: 40.0000 mg | ORAL_TABLET | Freq: Every day | ORAL | Status: DC

## 2016-12-26 MED ORDER — HEPARIN SODIUM (PORCINE) 5000 UNIT/ML IJ SOLN
5000.0000 [IU] | Freq: Three times a day (TID) | INTRAMUSCULAR | Status: DC
  Administered 2016-12-26 – 2017-01-03 (×23): 5000 [IU] via SUBCUTANEOUS
  Filled 2016-12-26 (×23): qty 1

## 2016-12-26 MED ORDER — ATORVASTATIN CALCIUM 80 MG PO TABS
80.0000 mg | ORAL_TABLET | Freq: Every day | ORAL | Status: DC
  Administered 2016-12-26 – 2017-01-02 (×8): 80 mg via ORAL
  Filled 2016-12-26 (×8): qty 1

## 2016-12-26 MED ORDER — INSULIN GLARGINE 100 UNIT/ML ~~LOC~~ SOLN
25.0000 [IU] | Freq: Two times a day (BID) | SUBCUTANEOUS | Status: DC
  Administered 2016-12-26 – 2017-01-02 (×14): 25 [IU] via SUBCUTANEOUS
  Filled 2016-12-26 (×17): qty 0.25

## 2016-12-26 NOTE — Progress Notes (Signed)
Pt to renal ultrasound in stable condition, will await her return.

## 2016-12-26 NOTE — Progress Notes (Signed)
Assessment: 1. CKD with acute component(uncertain baseline).  Screat was 1.3 in NOv 2016. Sees a "kidney doctor" in [place].   2. Vol excess - sig LE edema, CXR clear 3. Severe multi-vessel CAD - deemed not surgical/ PCI candidate in 2016 4. DM2 5. HTNive crisis - rx'd with IV NTG and po BP medications.  Better now. OK to continue Cozaar/ Toprol as home meds.   6. Severe ICM - EF 20% in 2016  Subjective: Interval History: feels better Objective: Vital signs in last 24 hours: Temp:  [98 F (36.7 C)-99.1 F (37.3 C)] 99.1 F (37.3 C) (04/09 1204) Pulse Rate:  [70-79] 71 (04/09 1204) Resp:  [11-25] 15 (04/09 1204) BP: (123-150)/(52-97) 141/92 (04/09 1204) SpO2:  [94 %-100 %] 100 % (04/09 1204) Weight:  [75.1 kg (165 lb 8 oz)] 75.1 kg (165 lb 8 oz) (04/09 0442) Weight change: 3.175 kg (7 lb)  Intake/Output from previous day: 04/08 0701 - 04/09 0700 In: 772 [P.O.:610; I.V.:162] Out: 1800 [Urine:1800] Intake/Output this shift: Total I/O In: 480 [P.O.:480] Out: 1125 [Urine:1125]  General appearance: alert and cooperative Chest wall: no tenderness Cardio: regular rate and rhythm, S1, S2 normal, no murmur, click, rub or gallop Extremities: right AKA, LLE tr to 1+  Lab Results:  Recent Labs  12/25/16 0449 12/26/16 0400  WBC 11.7* 9.5  HGB 9.7* 9.0*  HCT 31.5* 29.1*  PLT 319 299   BMET:  Recent Labs  12/25/16 0449 12/26/16 0400  NA 139 138  K 4.5 3.9  CL 105 104  CO2 23 23  GLUCOSE 197* 186*  BUN 46* 50*  CREATININE 2.60* 3.24*  CALCIUM 8.6* 8.5*   No results for input(s): PTH in the last 72 hours. Iron Studies: No results for input(s): IRON, TIBC, TRANSFERRIN, FERRITIN in the last 72 hours. Studies/Results: Dg Chest 2 View  Result Date: 12/25/2016 CLINICAL DATA:  Chest pain, onset at 20:00. No relief from home nitroglycerin. EXAM: CHEST  2 VIEW COMPARISON:  08/11/2015 FINDINGS: The lungs are clear. The pulmonary vasculature is normal. Heart size is normal.  Hilar and mediastinal contours are unremarkable. There is no pleural effusion. IMPRESSION: No active cardiopulmonary disease. Electronically Signed   By: Andreas Newport M.D.   On: 12/25/2016 01:24   US Renal  Result Date: 12/26/2016 CLINICAL DATA:  Stage IV chronic renal disease.  Initial encounter. EXAM: RENAL / URINARY TRACT ULTRASOUND COMPLETE COMPARISON:  Renal ultrasound performed 11/25/2015 FINDINGS: Right Kidney: Length: 11.3 cm. Mildly increased renal parenchymal echogenicity noted. A 1.4 cm mildly complex cystic focus is noted at the upper pole of the right kidney. No hydronephrosis visualized. Left Kidney: Length: 10.1 cm. Mildly increased renal parenchymal echogenicity noted. No mass or hydronephrosis visualized. Bladder: Appears normal for degree of bladder distention. Multiple stones are noted within the gallbladder, measuring up to 8 mm in size. IMPRESSION: 1. No evidence of hydronephrosis. 2. Mildly increased renal parenchymal echogenicity raises concern for medical renal disease. 3. 1.4 cm mildly complex cystic focus at the upper pole of the right kidney is likely benign, only minimally changed from 2017. 4. Cholelithiasis.  Gallbladder otherwise unremarkable. Electronically Signed   By: Garald Balding M.D.   On: 12/26/2016 05:47    Scheduled: . aspirin  81 mg Oral Daily  . atorvastatin  80 mg Oral q1800  . furosemide  80 mg Intravenous BID  . heparin subcutaneous  5,000 Units Subcutaneous Q8H  . insulin aspart  0-15 Units Subcutaneous TID WC  . insulin aspart  0-5 Units Subcutaneous QHS  . insulin aspart  5 Units Subcutaneous TID AC  . insulin glargine  25 Units Subcutaneous BID  . isosorbide mononitrate  30 mg Oral Daily  . losartan  100 mg Oral Daily  . metoprolol succinate  25 mg Oral Daily     LOS: 1 day   Aury Scollard C 12/26/2016,1:10 PM

## 2016-12-26 NOTE — Progress Notes (Addendum)
Progress Note  Patient Name: Jenna Brown Date of Encounter: 12/26/2016  Primary Cardiologist: Dr. Haroldine Laws  Subjective   Feels better, no chest pain, no SOB just back from ultrasound  Inpatient Medications    Scheduled Meds: . aspirin  81 mg Oral Daily  . digoxin  0.125 mg Oral Daily  . furosemide  80 mg Intravenous BID  . insulin aspart  0-15 Units Subcutaneous TID WC  . insulin aspart  0-5 Units Subcutaneous QHS  . insulin aspart  5 Units Subcutaneous TID AC  . insulin glargine  35 Units Subcutaneous QHS  . isosorbide mononitrate  30 mg Oral Daily  . losartan  100 mg Oral Daily  . metoprolol succinate  25 mg Oral Daily  . rosuvastatin  20 mg Oral Daily   Continuous Infusions: . heparin 1,050 Units/hr (12/26/16 0213)  . nitroGLYCERIN 10 mcg/min (12/25/16 1900)   PRN Meds: acetaminophen, ALPRAZolam, nitroGLYCERIN, ondansetron (ZOFRAN) IV   Vital Signs    Vitals:   12/26/16 0240 12/26/16 0442 12/26/16 0505 12/26/16 0752  BP: (!) 123/97 (!) 141/72 (!) 150/79 (!) 149/70  Pulse: 71 70 71 72  Resp: 11 19 18 12   Temp:  98.5 F (36.9 C)  99.1 F (37.3 C)  TempSrc:  Oral  Oral  SpO2: 98% 94% 99% 99%  Weight:  165 lb 8 oz (75.1 kg)    Height:        Intake/Output Summary (Last 24 hours) at 12/26/16 0951 Last data filed at 12/26/16 0850  Gross per 24 hour  Intake              642 ml  Output             2000 ml  Net            -1358 ml   Filed Weights   12/25/16 0246 12/26/16 0442  Weight: 158 lb 8 oz (71.9 kg) 165 lb 8 oz (75.1 kg)    Telemetry   SR occ PVC - Personally Reviewed  ECG    SR with t wave abnormality but no acute changes - Personally Reviewed  Physical Exam   GEN: No acute distress.   Neck: No JVD Heent: PERRLA Cardiac: RRR, no murmurs, rubs, or gallops.  Respiratory: Clear to auscultation bilaterally. GI: Soft, nontender, non-distended  MS: 1+ edema; No deformity. Neuro:  Nonfocal  Psych: Normal affect   Labs     Chemistry  Recent Labs Lab 12/25/16 0042 12/25/16 0449 12/26/16 0400  NA 138 139 138  K 4.5 4.5 3.9  CL 107 105 104  CO2  --  23 23  GLUCOSE 272* 197* 186*  BUN 41* 46* 50*  CREATININE 2.90* 2.60* 3.24*  CALCIUM  --  8.6* 8.5*  GFRNONAA  --  18* 14*  GFRAA  --  21* 16*  ANIONGAP  --  11 11     Hematology  Recent Labs Lab 12/25/16 0027 12/25/16 0042 12/25/16 0449 12/26/16 0400  WBC 11.5*  --  11.7* 9.5  RBC 3.98  --  3.81* 3.50*  HGB 10.4* 11.2* 9.7* 9.0*  HCT 33.2* 33.0* 31.5* 29.1*  MCV 83.4  --  82.7 83.1  MCH 26.1  --  25.5* 25.7*  MCHC 31.3  --  30.8 30.9  RDW 12.8  --  12.9 13.1  PLT 317  --  319 299    Cardiac Enzymes  Recent Labs Lab 12/25/16 0449 12/25/16 0941 12/25/16 1508  TROPONINI 4.53* 4.62*  4.61*     Recent Labs Lab 12/25/16 0040  TROPIPOC 0.53*     BNPNo results for input(s): BNP, PROBNP in the last 168 hours.   DDimer No results for input(s): DDIMER in the last 168 hours.   Radiology    Dg Chest 2 View  Result Date: 12/25/2016 CLINICAL DATA:  Chest pain, onset at 20:00. No relief from home nitroglycerin. EXAM: CHEST  2 VIEW COMPARISON:  08/11/2015 FINDINGS: The lungs are clear. The pulmonary vasculature is normal. Heart size is normal. Hilar and mediastinal contours are unremarkable. There is no pleural effusion. IMPRESSION: No active cardiopulmonary disease. Electronically Signed   By: Andreas Newport M.D.   On: 12/25/2016 01:24   US Renal  Result Date: 12/26/2016 CLINICAL DATA:  Stage IV chronic renal disease.  Initial encounter. EXAM: RENAL / URINARY TRACT ULTRASOUND COMPLETE COMPARISON:  Renal ultrasound performed 11/25/2015 FINDINGS: Right Kidney: Length: 11.3 cm. Mildly increased renal parenchymal echogenicity noted. A 1.4 cm mildly complex cystic focus is noted at the upper pole of the right kidney. No hydronephrosis visualized. Left Kidney: Length: 10.1 cm. Mildly increased renal parenchymal echogenicity noted. No mass or  hydronephrosis visualized. Bladder: Appears normal for degree of bladder distention. Multiple stones are noted within the gallbladder, measuring up to 8 mm in size. IMPRESSION: 1. No evidence of hydronephrosis. 2. Mildly increased renal parenchymal echogenicity raises concern for medical renal disease. 3. 1.4 cm mildly complex cystic focus at the upper pole of the right kidney is likely benign, only minimally changed from 2017. 4. Cholelithiasis.  Gallbladder otherwise unremarkable. Electronically Signed   By: Garald Balding M.D.   On: 12/26/2016 05:47    Cardiac Studies   none  Patient Profile     66 y.o. female with a history significant for multivessel-CAD deemed to be a poor candidate for surgical or percutaneous revascularization (Prox LAD to Mid LAD lesion 90%, Ost Cx to Mid Cx lesion 95%, Ost 2nd Mrg to 2nd Mrg lesion 80%,Mid Cx lesion 90%, Prox RCA lesion 60%, Mid RCA lesion 50%),   ICM leading to HFrEF (LVEF=20%, IDDM, HTN, dyslipidemia, prior CVA, and s/p right BKA who presented with chest discomfort in the setting of SBP>248mmHg  Assessment & Plan    # Hypertensive emergency: no evidence of neuro complication but pt with elevated troponin and AKI  BP improved 149/70 - nitro gtt started in the ED but BP improved now so will stop NTG - ContinueToprol 25 mg daily and imdur 30mg  PO daily - lasix and losartan resumed per nephrology - if BP continues elevated consider starting hydralazine 25mg  PO TID  # NSTEMI; favor demand ischemia over acute plaque rupture event-- pk troponin 4.62 now flat at this- unable to cath with AKI - Would not pursue cath due to worsening renal function as well as the fact that patient has been deemed not to be a candidate for PCI or CABG.   - Continue medical management - needs aggressive Rx of HTN and also needs to be compliant with meds - continue home ASA and ticagrelor- she had been off ticagrelor - ok to stop Heparin from cardiac standpoint - will change  to SQ Heparin for DVT prophylaxis  # Dyslipidemia - was on pravastatin as an outpt but doubt she was taking this as LDL was 197 - Now on rosuvastatin in hospital - cannot increase to 40mg  daily due to CKD - change to Lipitor 80mg  daily - Will need repeat FLP and ALT in 6 weeks.   #  AKI; reduced GFR in the setting of HTN emergency  Cr now 3.24.climbing - UA  - urine lytes - nephrology is following  # ICM with EF 20% and repeat echo 07/2015 with EF 20% and G1DD, mild LVH.  -wt change from 158 to 165 ?scales but is net negatvie 1385cc today. -diuretic therapy per nephrology -continue BB/Imdur/ARB. -not a candidate for spironolactone or entresto due to renal disease. -stop digoxin due to AKI -repeat echo as there has not been a followup since 2016  # CAD per Dr. Prescott Gum in 03/2015 "coronary vessels are poor targets for grafting due to small size and diffuse disease LV function is severely depressed and with poor targets would preclude beneficial response to CABG"  She is not a good candidate for PCI either.  # IDDM glucose elevated at times.  - continue home basal insulin - SSI TIDAC and HS - A1C 8.2 will ask diabetic coordinator to see.  Signed, Cecilie Kicks, NP  12/26/2016, 9:51 AM    Patient seen and independently examined with Kenney Houseman, NP. We discussed all aspects of the encounter. I agree with the assessment and plan as stated above with minor changes.  Patient denies any chest pain or pressure this am.  Trop elevation with flat trend but high at 4.61.  I suspect that this is demand ischemia in the setting of hypertensive urgency and worsening renal function.  She has been deemed not to be a candidate for PCI or CABG so need to continue medical therapy.  She needs to be compliant with medical therapy.  Continue DAPT, change to high dose statin with Lipitor (noncompliant with LDL very high), continue BB, ARB and Imdur.  May need to add Hydralazine if BP increases.  Will ask  medicine to take on their service given her multiple med problems including acute on CKD, poorly controlled DM (HbA1C of 8.2) and no further intervention planned for CAD.  I have spent a total of 35 minutes with patient reviewing hospital records, cath report, echo report , telemetry, EKGs, labs and examining patient as well as establishing an assessment and plan that was discussed with the patient.  > 50% of time was spent in direct patient care.    Signed: Fransico Him, MD [facility] 12/26/2016

## 2016-12-26 NOTE — Progress Notes (Signed)
   12/26/16 1025  Clinical Encounter Type  Visited With Patient  Visit Type Initial  Referral From Patient  Consult/Referral To Chaplain  Recommendations (follow up if needed)  Spiritual Encounters  Spiritual Needs Prayer;Emotional  Stress Factors  Patient Stress Factors None identified  Pt. Requesting prayer, offered prayer, emotional support.  Chaplain Rheda Kassab. A.Alek Poncedeleon , MA-PC, BA-REL/PHIL, [phone]

## 2016-12-26 NOTE — Progress Notes (Signed)
Pt returned from renal ultrasound in stable condition, assisted her to the [facility].

## 2016-12-26 NOTE — H&P (Signed)
Consultation Note   Jenna Brown FYB:[id number] DOB: [birth year] DOA: 12/25/2016   PCP: [name], MD   Patient coming from/Resides with: Private residence/lives with mother and sister  Requesting physician: Dr. Radford Pax  Reason for consultation: Management of diabetes  HPI: Jenna Brown is a 66 y.o. female with medical history significant for remote CVA, severe PVD status post bilateral AKA, hypertension, severe multivessel CAD not candidate for CABG or PCI, ischemic cardiomyopathy with chronic systolic heart failure EF 20% based on echocardiogram 2016, chronic kidney disease followed by nephrology in [place], [place], and dyslipidemia. Patient was admitted by the cardiology service on 4/8 after presenting with chest pain setting of hypertensive urgency. Enzymes are positive with troponin greater than 4. She was also found to have worsened from 2016 baseline renal function so nephrology was consulted. Chest x-ray did not demonstrate any evidence of heart failure although patient reports orthopnea and edema symptoms (abdominal wall edema). Hemoglobin A1c was 8.2 and CBGs have been for the most part greater than 200 since arrival. We've been asked to assist in management of patient's diabetes.   Review of Systems:  In addition to the HPI above,  No Fever-chills, myalgias or other constitutional symptoms No Headache, changes with Vision or hearing, new weakness, tingling, numbness in any extremity, dizziness, dysarthria or word finding difficulty, gait disturbance or imbalance, tremors or seizure activity No problems swallowing food or Liquids, indigestion/reflux, choking or coughing while eating, abdominal pain with or after eating No Cough or Shortness of Breath, palpitations No Abdominal pain, N/V, melena,hematochezia, dark tarry stools, constipation No dysuria, malodorous urine, hematuria or flank pain No new skin rashes, lesions, masses or bruises, No new joint pains,  aches, swelling or redness No recent unintentional weight gain or loss No polyuria, polydypsia or polyphagia   Past Medical History:  Diagnosis Date  . Anxiety    takes Xanax prn  . Arthritis   . GERD (gastroesophageal reflux disease)   . H/O hiatal hernia   . Headache(784.0)    when b/p is elevated  . Hemorrhoids   . Hyperlipidemia    takes Simvastatin daily  . Hypertension    takes Amlodipine/HCTZ/Losartan daily  . Migraines   . Myocardial infarction 1990's  . Peripheral vascular disease ([place])   . PONV (postoperative nausea and vomiting)   . Stroke [facility])    TIA history  . Type II diabetes mellitus (HCC)    takes Glucovance and Januvia daily  . Vertigo    takes Ativert prn    Past Surgical History:  Procedure Laterality Date  . ABDOMINAL AORTAGRAM N/A 02/29/2012   Procedure: ABDOMINAL Maxcine Ham;  Surgeon: [name], MD;  Location: [facility] CATH LAB;  Service: Cardiovascular;  Laterality: N/A;  . AMPUTATION  05/10/2012   Procedure: AMPUTATION ABOVE KNEE;  Surgeon: [name], MD;  Location: [place] OR;  Service: Vascular;  Laterality: Right;   open right groin wound noted  . aortogram  02/2012  . CARDIAC CATHETERIZATION N/A 04/13/2015   Procedure: Right/Left Heart Cath and Coronary Angiography;  Surgeon: [name], MD;  Location: [place] CV LAB;  Service: Cardiovascular;  Laterality: N/A;  . CLEFT PALATE REPAIR     several  . COLONOSCOPY    . DILATION AND CURETTAGE OF UTERUS    . FEMORAL-POPLITEAL BYPASS GRAFT  04/05/2012   Procedure: BYPASS GRAFT FEMORAL-POPLITEAL ARTERY;  Surgeon: [name], MD;  Location: [place];  Service: Vascular;  Laterality: Right;  REVISION  . FEMORAL-TIBIAL  BYPASS GRAFT  03/29/2012   Procedure: BYPASS GRAFT FEMORAL-TIBIAL ARTERY;  Surgeon: [name], MD;  Location: [place] OR;  Service: Vascular;  Laterality: Right;  Right femoral to Posterior Tibialis with composite graft of 76mm x 80 cm ringed gortex graft and saphenous vein   ,intraoperative arteriogram  . FEMOROPOPLITEAL THROMBECTOMY / EMBOLECTOMY  05/09/2012  . MYOMECTOMY    . PR VEIN BYPASS GRAFT,AORTO-FEM-POP  05/27/11   Right SFA-Below knee Pop BP  . TONSILLECTOMY    . TUBAL LIGATION  ~ 1990    Social History   Social History  . Marital status: Single    Spouse name: N/A  . Number of children: N/A  . Years of education: N/A   Occupational History  . Not on file.   Social History Main Topics  . Smoking status: Former Smoker    Packs/day: 0.25    Years: 40.00    Types: Cigarettes    Quit date: 02/27/2012  . Smokeless tobacco: Never Used  . Alcohol use No  . Drug use: No  . Sexual activity: No   Other Topics Concern  . Not on file   Social History Narrative  . No narrative on file    Mobility: Wheelchair-unable to use bilateral lower extremity prosthesis secondary to worsening dyspnea on exertion with use Work history: Disabled   Allergies  Allergen Reactions  . Plavix [Clopidogrel Bisulfate] Itching  . Codeine Other (See Comments)    "makes me feel strange"  . Tylenol With Codeine #3 [Acetaminophen-Codeine] Other (See Comments)    "doesn't make me feel right"  . Tylenol [Acetaminophen] Other (See Comments)    "Doesn't feel right"     Family History  Problem Relation Age of Onset  . Cancer Mother     breast  . Diabetes Father      Prior to Admission medications   Medication Sig Start Date End Date Taking? Authorizing Provider  acetaminophen (TYLENOL) 650 MG CR tablet Take 650 mg by mouth at bedtime.    Yes Historical Provider, MD  ALPRAZolam (XANAX) 0.25 MG tablet Take 0.25 mg by mouth 3 (three) times daily as needed for anxiety.    Yes Historical Provider, MD  aspirin 81 MG chewable tablet Chew 81 mg by mouth daily.   Yes Historical Provider, MD  digoxin (LANOXIN) 0.125 MG tablet Take 1 tablet (0.125 mg total) by mouth daily. 05/14/15  Yes Amy D Clegg, NP  furosemide (LASIX) 40 MG tablet Take 1 tablet (40 mg total) by mouth  daily. 05/14/15  Yes Amy D Clegg, NP  insulin glargine (LANTUS) 100 UNIT/ML injection Inject 25 Units into the skin 2 (two) times daily.    Yes Historical Provider, MD  insulin lispro (HUMALOG) 100 UNIT/ML injection Inject 5 Units into the skin 3 (three) times daily before meals.   Yes Historical Provider, MD  Insulin Pen Needle (AURORA PEN NEEDLES) 29G X 12MM MISC 5 Units by Does not apply route 3 (three) times daily before meals. 05/16/15  Yes Ricard Dillon, MD  isosorbide mononitrate (IMDUR) 30 MG 24 hr tablet take 1 tablet by mouth once daily 04/18/16  Yes Jolaine Artist, MD  losartan (COZAAR) 100 MG tablet Take 100 mg by mouth daily.   Yes Historical Provider, MD  metoprolol succinate (TOPROL-XL) 25 MG 24 hr tablet Take 25 mg by mouth daily.   Yes Historical Provider, MD  nitroGLYCERIN (NITROSTAT) 0.4 MG SL tablet Place 1 tablet (0.4 mg total) under the tongue every 5 (  five) minutes x 3 doses as needed for chest pain. 08/13/15  Yes Rogelia Mire, NP  pravastatin (PRAVACHOL) 40 MG tablet Take 40 mg by mouth daily.   Yes Historical Provider, MD    Physical Exam: Vitals:   12/26/16 0240 12/26/16 0442 12/26/16 0505 12/26/16 0752  BP: (!) 123/97 (!) 141/72 (!) 150/79 (!) 149/70  Pulse: 71 70 71 72  Resp: 11 19 18 12   Temp:  98.5 F (36.9 C)  99.1 F (37.3 C)  TempSrc:  Oral  Oral  SpO2: 98% 94% 99% 99%  Weight:  75.1 kg (165 lb 8 oz)    Height:          Constitutional: NAD, calm, comfortable Eyes: PERRL, lids and conjunctivae normal ENMT: Mucous membranes are moist. Posterior pharynx clear of any exudate or lesions.Normal dentition.  Neck: normal, supple, no masses, no thyromegaly Respiratory: clear to auscultation bilaterally with a few bibasilar crackles Normal respiratory effort. No accessory muscle use.  Cardiovascular: Regular rate and rhythm, no murmurs / rubs / gallops. No extremity edema. 2+ pedal pulses. No carotid bruits.  Abdomen: no tenderness, no masses  palpated. No hepatosplenomegaly. Puffy but not distended and bowel sounds are active. Musculoskeletal: no clubbing / cyanosis. No joint deformity upper and lower extremities. Good ROM, no contractures. Normal muscle tone. Bilateral AKA. Skin: no rashes, lesions, ulcers. No induration Neurologic: CN 2-12 grossly intact. Sensation intact, DTR normal. Strength 5/5 x all 4 extremities.  Psychiatric: Normal judgment and insight. Alert and oriented x 3. Normal mood.    Labs on Admission: I have personally reviewed following labs and imaging studies  CBC:  Recent Labs Lab 12/25/16 0027 12/25/16 0042 12/25/16 0449 12/26/16 0400  WBC 11.5*  --  11.7* 9.5  NEUTROABS 9.1*  --   --   --   HGB 10.4* 11.2* 9.7* 9.0*  HCT 33.2* 33.0* 31.5* 29.1*  MCV 83.4  --  82.7 83.1  PLT 317  --  319 557   Basic Metabolic Panel:  Recent Labs Lab 12/25/16 0042 12/25/16 0449 12/26/16 0400  NA 138 139 138  K 4.5 4.5 3.9  CL 107 105 104  CO2  --  23 23  GLUCOSE 272* 197* 186*  BUN 41* 46* 50*  CREATININE 2.90* 2.60* 3.24*  CALCIUM  --  8.6* 8.5*   GFR: Estimated Creatinine Clearance: 17.6 mL/min (A) (by C-G formula based on SCr of 3.24 mg/dL (H)). Liver Function Tests: No results for input(s): AST, ALT, ALKPHOS, BILITOT, PROT, ALBUMIN in the last 168 hours. No results for input(s): LIPASE, AMYLASE in the last 168 hours. No results for input(s): AMMONIA in the last 168 hours. Coagulation Profile: No results for input(s): INR, PROTIME in the last 168 hours. Cardiac Enzymes:  Recent Labs Lab 12/25/16 0449 12/25/16 0941 12/25/16 1508  TROPONINI 4.53* 4.62* 4.61*   BNP (last 3 results) No results for input(s): PROBNP in the last 8760 hours. HbA1C:  Recent Labs  12/25/16 0449  HGBA1C 8.2*   CBG:  Recent Labs Lab 12/25/16 0854 12/25/16 1134 12/25/16 1632 12/25/16 2044 12/26/16 0754  GLUCAP 223* 233* 157* 210* 138*   Lipid Profile:  Recent Labs  12/25/16 0449  CHOL 262*    HDL 41  LDLCALC 197*  TRIG 118  CHOLHDL 6.4   Thyroid Function Tests: No results for input(s): TSH, T4TOTAL, FREET4, T3FREE, THYROIDAB in the last 72 hours. Anemia Panel: No results for input(s): VITAMINB12, FOLATE, FERRITIN, TIBC, IRON, RETICCTPCT in the last 72  hours. Urine analysis:    Component Value Date/Time   COLORURINE YELLOW 08/13/2015 1023   APPEARANCEUR CLEAR 08/13/2015 1023   LABSPEC 1.016 08/13/2015 1023   PHURINE 5.5 08/13/2015 1023   GLUCOSEU NEGATIVE 08/13/2015 1023   HGBUR SMALL (A) 08/13/2015 1023   BILIRUBINUR NEGATIVE 08/13/2015 1023   KETONESUR NEGATIVE 08/13/2015 1023   PROTEINUR 100 (A) 08/13/2015 1023   UROBILINOGEN 0.2 04/10/2015 1248   NITRITE NEGATIVE 08/13/2015 1023   LEUKOCYTESUR NEGATIVE 08/13/2015 1023   Sepsis Labs: @LABRCNTIP (procalcitonin:4,lacticidven:4) )No results found for this or any previous visit (from the past 240 hour(s)).   Radiological Exams on Admission: Dg Chest 2 View  Result Date: 12/25/2016 CLINICAL DATA:  Chest pain, onset at 20:00. No relief from home nitroglycerin. EXAM: CHEST  2 VIEW COMPARISON:  08/11/2015 FINDINGS: The lungs are clear. The pulmonary vasculature is normal. Heart size is normal. Hilar and mediastinal contours are unremarkable. There is no pleural effusion. IMPRESSION: No active cardiopulmonary disease. Electronically Signed   By: Andreas Newport M.D.   On: 12/25/2016 01:24   US Renal  Result Date: 12/26/2016 CLINICAL DATA:  Stage IV chronic renal disease.  Initial encounter. EXAM: RENAL / URINARY TRACT ULTRASOUND COMPLETE COMPARISON:  Renal ultrasound performed 11/25/2015 FINDINGS: Right Kidney: Length: 11.3 cm. Mildly increased renal parenchymal echogenicity noted. A 1.4 cm mildly complex cystic focus is noted at the upper pole of the right kidney. No hydronephrosis visualized. Left Kidney: Length: 10.1 cm. Mildly increased renal parenchymal echogenicity noted. No mass or hydronephrosis visualized. Bladder:  Appears normal for degree of bladder distention. Multiple stones are noted within the gallbladder, measuring up to 8 mm in size. IMPRESSION: 1. No evidence of hydronephrosis. 2. Mildly increased renal parenchymal echogenicity raises concern for medical renal disease. 3. 1.4 cm mildly complex cystic focus at the upper pole of the right kidney is likely benign, only minimally changed from 2017. 4. Cholelithiasis.  Gallbladder otherwise unremarkable. Electronically Signed   By: Garald Balding M.D.   On: 12/26/2016 05:47     Assessment/Plan Principal Problem:   Diabetes mellitus, insulin dependent (IDDM), uncontrolled  -CBGs have ranged between 138 and 233 since arrival -Patient reports at home average between 110 and 150 occasionally over 200 -At home takes Lantus 25 units twice a day-Lantus 35 units at at bedtime ordered here -Patient has received 15 units of sliding scale insulin since arrival-we'll resume home dose Lantus -During conversation patient reports pharmacy "no longer pays for" her Lantus and now she has a Humalog pen to use which is regular sliding scale insulin -I have asked case management to investigate if patient remains eligible for Lantus and her Levemir-if not we'll need to transition to 70/30 insulin -Hemoglobin A1c 8.2 and was 8.1 in 2016 when she was last treated at this facility  Active Problems:   ?? Acute kidney injury on Chronic kidney disease (CKD), stage IV (severe)  -Uncertain if secondary to non-STEMI and/or acute SHF exac-defer to nephrology -Followed by nephrologist in [facility] -Baseline renal function 2016: 32 and 1.32 -Current renal function: 50 and 3.24    Essential hypertension -Presented with hypertensive urgency-effectively treated with IV nitroglycerin -Current blood pressure controlled -Nephrology has approved continued use of home Cozaar and Toprol    Cardiomyopathy, ischemic/Acute on chronic systolic heart failure, NYHA class 3  -Possible acute  exacerbation-defer management to cardiology -Consider repeating echocardiogram to see if EF has improved with medical therapies (last obtained 2016/ EF 20%) -Nephrology recommends continue to diurese with IV Lasix -  Daily weighs-strict I/O    NSTEMI (non-ST elevated myocardial infarction)/multivessel CAD in native artery/Unstable angina -Previously documented as not candidate for CABG or PCI -3 no function on a candidate for catheterization -Management per cardiology  -Troponin elevated greater than 4 but trend is flat with cardiology documenting that elevated troponin likely related to demand ischemia from hypertensive urgency -Of note, patient misunderstood that her cardiac medications would "break up my heart blockages"-discussed with patient the purpose of cardiac medications specifically statin therapy and DAPT role in thrombus prevention    HLD (hyperlipidemia) -Continue Crestor      DVT prophylaxis: Subcutaneous heparin Code Status: Full Family Communication: No family at bedside Disposition Plan: [place]. ANP-BC [facility] Pager [phone]   If 7PM-7AM, please contact night-coverage [email or web address] Password [email or web address]  12/26/2016, 10:29 AM

## 2016-12-27 ENCOUNTER — Inpatient Hospital Stay (HOSPITAL_COMMUNITY): Payer: Medicare Other

## 2016-12-27 DIAGNOSIS — I429 Cardiomyopathy, unspecified: Secondary | ICD-10-CM

## 2016-12-27 DIAGNOSIS — E1029 Type 1 diabetes mellitus with other diabetic kidney complication: Secondary | ICD-10-CM

## 2016-12-27 DIAGNOSIS — I5023 Acute on chronic systolic (congestive) heart failure: Secondary | ICD-10-CM

## 2016-12-27 DIAGNOSIS — E1065 Type 1 diabetes mellitus with hyperglycemia: Secondary | ICD-10-CM

## 2016-12-27 LAB — ECHOCARDIOGRAM COMPLETE
E decel time: 201 msec
EERAT: 14.56
FS: 35 % (ref 28–44)
HEIGHTINCHES: 64 in
IVS/LV PW RATIO, ED: 0.9
LA ID, A-P, ES: 37 mm
LA diam end sys: 37 mm
LA vol index: 24.5 mL/m2
LADIAMINDEX: 2.07 cm/m2
LAVOL: 43.8 mL
LAVOLA4C: 50.2 mL
LV E/e' medial: 14.56
LVEEAVG: 14.56
LVELAT: 5.11 cm/s
LVOT VTI: 21.9 cm
LVOT area: 2.84 cm2
LVOT peak grad rest: 5 mmHg
LVOT peak vel: 107 cm/s
LVOTD: 19 mm
LVOTSV: 62 mL
MV Dec: 201
MV Peak grad: 2 mmHg
MVPKAVEL: 115 m/s
MVPKEVEL: 74.4 m/s
PW: 12.2 mm — AB (ref 0.6–1.1)
RV LATERAL S' VELOCITY: 13.1 cm/s
RV TAPSE: 16.1 mm
TDI e' lateral: 5.11
TDI e' medial: 4.46
WEIGHTICAEL: 2595.2 [oz_av]

## 2016-12-27 LAB — BASIC METABOLIC PANEL
ANION GAP: 11 (ref 5–15)
BUN: 53 mg/dL — ABNORMAL HIGH (ref 6–20)
CALCIUM: 8.6 mg/dL — AB (ref 8.9–10.3)
CHLORIDE: 106 mmol/L (ref 101–111)
CO2: 22 mmol/L (ref 22–32)
Creatinine, Ser: 3.51 mg/dL — ABNORMAL HIGH (ref 0.44–1.00)
GFR calc non Af Amer: 13 mL/min — ABNORMAL LOW (ref >60)
GFR, EST AFRICAN AMERICAN: 15 mL/min — AB (ref >60)
Glucose, Bld: 112 mg/dL — ABNORMAL HIGH (ref 65–99)
POTASSIUM: 4.4 mmol/L (ref 3.5–5.1)
Sodium: 139 mmol/L (ref 135–145)

## 2016-12-27 LAB — CBC
HEMATOCRIT: 30.7 % — AB (ref 36.0–46.0)
HEMOGLOBIN: 9.4 g/dL — AB (ref 12.0–15.0)
MCH: 25.5 pg — AB (ref 26.0–34.0)
MCHC: 30.6 g/dL (ref 30.0–36.0)
MCV: 83.2 fL (ref 78.0–100.0)
Platelets: 299 10*3/uL (ref 150–400)
RBC: 3.69 MIL/uL — AB (ref 3.87–5.11)
RDW: 13 % (ref 11.5–15.5)
WBC: 8.1 10*3/uL (ref 4.0–10.5)

## 2016-12-27 LAB — GLUCOSE, CAPILLARY
GLUCOSE-CAPILLARY: 95 mg/dL (ref 65–99)
GLUCOSE-CAPILLARY: 220 mg/dL — AB (ref 65–99)
Glucose-Capillary: 134 mg/dL — ABNORMAL HIGH (ref 65–99)
Glucose-Capillary: 165 mg/dL — ABNORMAL HIGH (ref 65–99)

## 2016-12-27 LAB — HEPARIN LEVEL (UNFRACTIONATED): Heparin Unfractionated: <0.1 IU/mL — ABNORMAL LOW (ref 0.30–0.70)

## 2016-12-27 MED ORDER — FUROSEMIDE 80 MG PO TABS
80.0000 mg | ORAL_TABLET | Freq: Every day | ORAL | Status: DC
  Administered 2016-12-28: 80 mg via ORAL
  Filled 2016-12-27: qty 1

## 2016-12-27 MED ORDER — ISOSORBIDE MONONITRATE ER 60 MG PO TB24: 60.0000 mg | ORAL_TABLET | Freq: Every day | ORAL | Status: DC

## 2016-12-27 MED ORDER — CARVEDILOL 6.25 MG PO TABS
6.2500 mg | ORAL_TABLET | Freq: Two times a day (BID) | ORAL | Status: DC
  Administered 2016-12-27 – 2016-12-31 (×8): 6.25 mg via ORAL
  Filled 2016-12-27 (×7): qty 1

## 2016-12-27 MED ORDER — CARVEDILOL 6.25 MG PO TABS
6.2500 mg | ORAL_TABLET | Freq: Two times a day (BID) | ORAL | Status: DC
  Filled 2016-12-27: qty 1

## 2016-12-27 MED ORDER — ISOSORBIDE MONONITRATE ER 30 MG PO TB24
30.0000 mg | ORAL_TABLET | Freq: Every day | ORAL | Status: DC
  Administered 2016-12-28 – 2017-01-03 (×7): 30 mg via ORAL
  Filled 2016-12-27 (×8): qty 1

## 2016-12-27 MED ORDER — ISOSORBIDE MONONITRATE ER 30 MG PO TB24: 30.0000 mg | ORAL_TABLET | Freq: Every day | ORAL | Status: DC

## 2016-12-27 MED ORDER — HYDRALAZINE HCL 10 MG PO TABS
10.0000 mg | ORAL_TABLET | Freq: Three times a day (TID) | ORAL | Status: DC
  Administered 2016-12-27 – 2016-12-28 (×3): 10 mg via ORAL
  Filled 2016-12-27 (×3): qty 1

## 2016-12-27 NOTE — Progress Notes (Signed)
Assessment: 1. CKD with acute component (uncertain baseline). Screat was 1.3 in Nov 2016. Sees a "kidney doctor" in [place].  2. Vol excess - sig LE edema, CXR clear  Begin PO Furosemide. 3. Severe multi-vessel CAD - deemed not surgical/ PCI candidate in 2016 4. DM2 5. HTNive crisis - rx'd with IV NTG and po BP medications. Better now.  Cozaar/ Toprol as home meds.  6. Severe ICM - EF 20% in 2016  Subjective: Interval History: Feels ok   Objective: Vital signs in last 24 hours: Temp:  [98.2 F (36.8 C)-98.3 F (36.8 C)] 98.2 F (36.8 C) (04/10 0645) Pulse Rate:  [63-73] 63 (04/10 1159) Resp:  [17-18] 18 (04/10 0645) BP: (141-148)/(66-82) 141/69 (04/10 1159) SpO2:  [98 %] 98 % (04/10 0645) Weight:  [73.6 kg (162 lb 3.2 oz)] 73.6 kg (162 lb 3.2 oz) (04/10 0645) Weight change: -1.497 kg (-3 lb 4.8 oz)  Intake/Output from previous day: 04/09 0701 - 04/10 0700 In: 480 [P.O.:480] Out: 2675 [Urine:2675] Intake/Output this shift: Total I/O In: 240 [P.O.:240] Out: 400 [Urine:400]  General appearance: alert and cooperative Resp: clear to auscultation bilaterally Cardio: regular rate and rhythm, S1, S2 normal, no murmur, click, rub or gallop Extremities: extremities normal, atraumatic, no cyanosis, and tr edema  Lab Results:  Recent Labs  12/26/16 0400 12/27/16 0532  WBC 9.5 8.1  HGB 9.0* 9.4*  HCT 29.1* 30.7*  PLT 299 299   BMET:  Recent Labs  12/26/16 0400 12/27/16 0538  NA 138 139  K 3.9 4.4  CL 104 106  CO2 23 22  GLUCOSE 186* 112*  BUN 50* 53*  CREATININE 3.24* 3.51*  CALCIUM 8.5* 8.6*   No results for input(s): PTH in the last 72 hours. Iron Studies: No results for input(s): IRON, TIBC, TRANSFERRIN, FERRITIN in the last 72 hours. Studies/Results: US Renal  Result Date: 12/26/2016 CLINICAL DATA:  Stage IV chronic renal disease.  Initial encounter. EXAM: RENAL / URINARY TRACT ULTRASOUND COMPLETE COMPARISON:  Renal ultrasound performed 11/25/2015  FINDINGS: Right Kidney: Length: 11.3 cm. Mildly increased renal parenchymal echogenicity noted. A 1.4 cm mildly complex cystic focus is noted at the upper pole of the right kidney. No hydronephrosis visualized. Left Kidney: Length: 10.1 cm. Mildly increased renal parenchymal echogenicity noted. No mass or hydronephrosis visualized. Bladder: Appears normal for degree of bladder distention. Multiple stones are noted within the gallbladder, measuring up to 8 mm in size. IMPRESSION: 1. No evidence of hydronephrosis. 2. Mildly increased renal parenchymal echogenicity raises concern for medical renal disease. 3. 1.4 cm mildly complex cystic focus at the upper pole of the right kidney is likely benign, only minimally changed from 2017. 4. Cholelithiasis.  Gallbladder otherwise unremarkable. Electronically Signed   By: Garald Balding M.D.   On: 12/26/2016 05:47    Scheduled: . aspirin  81 mg Oral Daily  . atorvastatin  80 mg Oral q1800  . carvedilol  6.25 mg Oral BID WC  . furosemide  80 mg Intravenous BID  . heparin subcutaneous  5,000 Units Subcutaneous Q8H  . hydrALAZINE  10 mg Oral Q8H  . insulin aspart  0-15 Units Subcutaneous TID WC  . insulin aspart  0-5 Units Subcutaneous QHS  . insulin aspart  5 Units Subcutaneous TID AC  . insulin glargine  25 Units Subcutaneous BID  . [START ON 12/28/2016] isosorbide mononitrate  30 mg Oral Daily  . losartan  100 mg Oral Daily    LOS: 2 days   Kaicen Desena C  12/27/2016,2:05 PM

## 2016-12-27 NOTE — Progress Notes (Signed)
PROGRESS NOTE    ANGELICA FRANDSEN  GGY:[id number] DOB: 09/30/1950 DOA: 12/25/2016 PCP: [name], MD   Chief Complaint  Patient presents with  . Chest Pain     Brief Narrative:  Consulted for diabetes management.   REEGAN MCTIGHE is a 66 y.o. female with medical history significant for remote CVA, severe PVD status post bilateral AKA, hypertension, severe multivessel CAD not candidate for CABG or PCI, ischemic cardiomyopathy with chronic systolic heart failure EF 20% based on echocardiogram 2016, chronic kidney disease followed by nephrology in [place], [place], and dyslipidemia. Patient was admitted by the cardiology service on 4/8 after presenting with chest pain setting of hypertensive urgency. Enzymes are positive with troponin greater than 4. She was also found to have worsened from 2016 baseline renal function so nephrology was consulted. Chest x-ray did not demonstrate any evidence of heart failure although patient reports orthopnea and edema symptoms (abdominal wall edema). Hemoglobin A1c was 8.2 and CBGs have been for the most part greater than 200 since arrival. We've been asked to assist in management of patient's diabetes.  Assessment & Plan  Diabetes mellitus, insulin dependent (IDDM), uncontrolled  -CBGs have ranged between 138 and 233 since arrival -Patient reports at home average between 110 and 150 occasionally over 200 -At home she was taking Lantus 25 units twice a day-Lantus 35 units at at bedtime ordered here- however, insurance no longer covering and patient placed on humalog.  -Hemoglobin A1c 8.2 and was 8.1 in 2016 when she was last treated at this facility -Have called [facility] for prior authorization of Lantus.- it is approved until 12/22/2017  Acute kidney injury on Chronic kidney disease (CKD), stage IV (severe)  -Uncertain if secondary to non-STEMI and/or acute SHF exac-defer to nephrology -Followed by nephrologist in [facility] -Baseline renal  function 2016: 1.32 -Creatinine currently 3.51 -Nephology consulted and appreciated -Per Care Everywhere, creatinine was 2.66 at [facility] in March 2018  Essential hypertension -Presented with hypertensive urgency-effectively treated with IV nitroglycerin -Current blood pressure controlled -Nephrology has approved continued use of home Cozaar and Toprol and transitioning to PO lasix  Cardiomyopathy, ischemic/Acute on chronic systolic heart failure, NYHA class 3  -Possible acute exacerbation-defer management to cardiology -Consider repeating echocardiogram to see if EF has improved with medical therapies (last obtained 2016/ EF 20%) -Nephrology recommends continue to diurese- transitioning to PO lasix -Daily weighs-strict I/O -Echocardiogram EF 03-50%, grade 1 diastolic dysfunction- left atrial density but cannot exclude mass, suggest TEE  NSTEMI (non-ST elevated myocardial infarction)/multivessel CAD in native artery/Unstable angina -Previously documented as not candidate for CABG or PCI -3 no function on a candidate for catheterization -Management per cardiology  -Troponin elevated greater than 4 but trend is flat with cardiology documenting that elevated troponin likely related to demand ischemia from hypertensive urgency -Of note, patient misunderstood that her cardiac medications would "break up my heart blockages"-discussed with patient the purpose of cardiac medications specifically statin therapy and DAPT role in thrombus prevention  HLD (hyperlipidemia) -Continue Crestor  DVT Prophylaxis  heparin  Code Status: Full  Family Communication: None at bedside  Disposition Plan: Admitted. Dispo per cardiology (primary)  Consultants [facility]  Procedures  Echocardiogram  Antibiotics   Anti-infectives    None      Subjective:   Ivana Nicastro seen and examined today.  Feeling better today, wishes to go home. Denies chest pain, shortness of breath, abdominal pain.  States her sugars have been controlled at home and go over 200  every once in a while. Recently placed on Humalog due to cost issues with Lantus.  Objective:   Vitals:   12/26/16 2049 12/27/16 0645 12/27/16 1159 12/27/16 1300  BP: (!) 143/82 (!) 148/66 (!) 141/69 138/71  Pulse: 72 73 63 72  Resp: 17 18  17   Temp: 98.3 F (36.8 C) 98.2 F (36.8 C)  98.1 F (36.7 C)  TempSrc: Oral Oral  Oral  SpO2: 98% 98%  99%  Weight:  73.6 kg (162 lb 3.2 oz)    Height:        Intake/Output Summary (Last 24 hours) at 12/27/16 1528 Last data filed at 12/27/16 1315  Gross per 24 hour  Intake              360 ml  Output             2050 ml  Net            -1690 ml   Filed Weights   12/25/16 0246 12/26/16 0442 12/27/16 0645  Weight: 71.9 kg (158 lb 8 oz) 75.1 kg (165 lb 8 oz) 73.6 kg (162 lb 3.2 oz)    Exam  General: Well developed, well nourished, NAD, appears stated age  HEENT: NCAT, mucous membranes moist.   Neck: Supple, + JVD, no masses  Cardiovascular: S1 S2 auscultated, RRR, no murmur  Respiratory: Clear to auscultation bilaterally with equal chest rise  Abdomen: Soft, nontender, nondistended, + bowel sounds  Extremities: warm dry without cyanosis clubbing. Trace LE edema.  Neuro: AAOx3, nonfocal  Psych: Normal affect and demeanor with intact judgement and insight   Data Reviewed: I have personally reviewed following labs and imaging studies  CBC:  Recent Labs Lab 12/25/16 0027 12/25/16 0042 12/25/16 0449 12/26/16 0400 12/27/16 0532  WBC 11.5*  --  11.7* 9.5 8.1  NEUTROABS 9.1*  --   --   --   --   HGB 10.4* 11.2* 9.7* 9.0* 9.4*  HCT 33.2* 33.0* 31.5* 29.1* 30.7*  MCV 83.4  --  82.7 83.1 83.2  PLT 317  --  319 299 740   Basic Metabolic Panel:  Recent Labs Lab 12/25/16 0042 12/25/16 0449 12/26/16 0400 12/27/16 0538  NA 138 139 138 139  K 4.5 4.5 3.9 4.4  CL 107 105 104 106  CO2  --  23 23 22   GLUCOSE 272* 197* 186* 112*  BUN 41* 46* 50* 53*    CREATININE 2.90* 2.60* 3.24* 3.51*  CALCIUM  --  8.6* 8.5* 8.6*   GFR: Estimated Creatinine Clearance: 16.1 mL/min (A) (by C-G formula based on SCr of 3.51 mg/dL (H)). Liver Function Tests: No results for input(s): AST, ALT, ALKPHOS, BILITOT, PROT, ALBUMIN in the last 168 hours. No results for input(s): LIPASE, AMYLASE in the last 168 hours. No results for input(s): AMMONIA in the last 168 hours. Coagulation Profile: No results for input(s): INR, PROTIME in the last 168 hours. Cardiac Enzymes:  Recent Labs Lab 12/25/16 0449 12/25/16 0941 12/25/16 1508  TROPONINI 4.53* 4.62* 4.61*   BNP (last 3 results) No results for input(s): PROBNP in the last 8760 hours. HbA1C:  Recent Labs  12/25/16 0449  HGBA1C 8.2*   CBG:  Recent Labs Lab 12/26/16 1203 12/26/16 1606 12/26/16 2049 12/27/16 0753 12/27/16 1102  GLUCAP 227* 117* 193* 134* 165*   Lipid Profile:  Recent Labs  12/25/16 0449  CHOL 262*  HDL 41  LDLCALC 197*  TRIG 118  CHOLHDL 6.4   Thyroid Function Tests:  No results for input(s): TSH, T4TOTAL, FREET4, T3FREE, THYROIDAB in the last 72 hours. Anemia Panel: No results for input(s): VITAMINB12, FOLATE, FERRITIN, TIBC, IRON, RETICCTPCT in the last 72 hours. Urine analysis:    Component Value Date/Time   COLORURINE YELLOW 08/13/2015 1023   APPEARANCEUR CLEAR 08/13/2015 1023   LABSPEC 1.016 08/13/2015 1023   PHURINE 5.5 08/13/2015 1023   GLUCOSEU NEGATIVE 08/13/2015 1023   HGBUR SMALL (A) 08/13/2015 1023   BILIRUBINUR NEGATIVE 08/13/2015 1023   KETONESUR NEGATIVE 08/13/2015 1023   PROTEINUR 100 (A) 08/13/2015 1023   UROBILINOGEN 0.2 04/10/2015 1248   NITRITE NEGATIVE 08/13/2015 1023   LEUKOCYTESUR NEGATIVE 08/13/2015 1023   Sepsis Labs: @LABRCNTIP (procalcitonin:4,lacticidven:4)  )No results found for this or any previous visit (from the past 240 hour(s)).    Radiology Studies: US Renal  Result Date: 12/26/2016 CLINICAL DATA:  Stage IV chronic  renal disease.  Initial encounter. EXAM: RENAL / URINARY TRACT ULTRASOUND COMPLETE COMPARISON:  Renal ultrasound performed 11/25/2015 FINDINGS: Right Kidney: Length: 11.3 cm. Mildly increased renal parenchymal echogenicity noted. A 1.4 cm mildly complex cystic focus is noted at the upper pole of the right kidney. No hydronephrosis visualized. Left Kidney: Length: 10.1 cm. Mildly increased renal parenchymal echogenicity noted. No mass or hydronephrosis visualized. Bladder: Appears normal for degree of bladder distention. Multiple stones are noted within the gallbladder, measuring up to 8 mm in size. IMPRESSION: 1. No evidence of hydronephrosis. 2. Mildly increased renal parenchymal echogenicity raises concern for medical renal disease. 3. 1.4 cm mildly complex cystic focus at the upper pole of the right kidney is likely benign, only minimally changed from 2017. 4. Cholelithiasis.  Gallbladder otherwise unremarkable. Electronically Signed   By: Garald Balding M.D.   On: 12/26/2016 05:47     Scheduled Meds: . aspirin  81 mg Oral Daily  . atorvastatin  80 mg Oral q1800  . carvedilol  6.25 mg Oral BID WC  . [START ON 12/28/2016] furosemide  80 mg Oral Daily  . heparin subcutaneous  5,000 Units Subcutaneous Q8H  . hydrALAZINE  10 mg Oral Q8H  . insulin aspart  0-15 Units Subcutaneous TID WC  . insulin aspart  0-5 Units Subcutaneous QHS  . insulin aspart  5 Units Subcutaneous TID AC  . insulin glargine  25 Units Subcutaneous BID  . [START ON 12/28/2016] isosorbide mononitrate  30 mg Oral Daily  . losartan  100 mg Oral Daily   Continuous Infusions:   LOS: 2 days   Time Spent in minutes   30 minutes  Jazara Swiney D.O. on 12/27/2016 at 3:28 PM  Between 7am to 7pm - Pager - [phone]  After 7pm go to [email or web address] - password [email or web address]  And look for the night coverage person covering for me after hours  [facility] Office  [phone]

## 2016-12-27 NOTE — Progress Notes (Signed)
  Echocardiogram 2D Echocardiogram has been performed.  Jenna Brown 12/27/2016, 2:04 PM

## 2016-12-27 NOTE — Care Management Note (Addendum)
Case Management Note  Patient Details  Name: Jenna Brown MRN: [id number] Date of Birth: 10-16-1950  Subjective/Objective:    Pt presented for increased glucose. Pt is from home with family support. Per pt she is wheelchair bound at home.                 Action/Plan: CM did receive referral for Lantus assistance and that the insurance was not paying for medications. CM did call [facility] on [address] and medication did need prior authorization at [phone]. MD to call for prior authorization and cost should be $3.70. Pt is aware that she should be able to pick medications up without any problem. CM did discuss with pt in regards to [place]- pt refusing [name] at this time. No further needs from CM at this time.   Expected Discharge Date:                  Expected Discharge Plan:  Home/Self Care  In-House Referral:  NA  Discharge planning Services  CM Consult  Post Acute Care Choice:  NA Choice offered to:  NA  DME Arranged:  N/A DME Agency:  NA  HH Arranged:  NA HH Agency:  NA  Status of Service:  Completed, signed off  If discussed at [place] of Stay Meetings, dates discussed:  01-03-17  Additional Comments: 1520 01-03-17 Jacqlyn Krauss, RN,BSN [phone] Post TEE today. Question new Brilinta. Benefits check in process.  Bethena Roys, RN 12/27/2016, 10:34 AM

## 2016-12-27 NOTE — Progress Notes (Addendum)
Progress Note  Patient Name: Jenna Brown Date of Encounter: 12/27/2016  Primary Cardiologist: Dr. Haroldine Laws  Subjective   Patient is feeling well; denies chest pain, SOB, and palpitations.   Inpatient Medications    Scheduled Meds: . aspirin  81 mg Oral Daily  . atorvastatin  80 mg Oral q1800  . furosemide  80 mg Intravenous BID  . heparin subcutaneous  5,000 Units Subcutaneous Q8H  . insulin aspart  0-15 Units Subcutaneous TID WC  . insulin aspart  0-5 Units Subcutaneous QHS  . insulin aspart  5 Units Subcutaneous TID AC  . insulin glargine  25 Units Subcutaneous BID  . isosorbide mononitrate  30 mg Oral Daily  . losartan  100 mg Oral Daily  . metoprolol succinate  25 mg Oral Daily   Continuous Infusions:  PRN Meds: acetaminophen, ALPRAZolam, nitroGLYCERIN, ondansetron (ZOFRAN) IV   Vital Signs    Vitals:   12/26/16 0752 12/26/16 1204 12/26/16 2049 12/27/16 0645  BP: (!) 149/70 (!) 141/92 (!) 143/82 (!) 148/66  Pulse: 72 71 72 73  Resp: 12 15 17 18   Temp: 99.1 F (37.3 C) 99.1 F (37.3 C) 98.3 F (36.8 C) 98.2 F (36.8 C)  TempSrc: Oral Oral Oral Oral  SpO2: 99% 100% 98% 98%  Weight:    162 lb 3.2 oz (73.6 kg)  Height:        Intake/Output Summary (Last 24 hours) at 12/27/16 8841 Last data filed at 12/27/16 0850  Gross per 24 hour  Intake              360 ml  Output             2200 ml  Net            -1840 ml   Filed Weights   12/25/16 0246 12/26/16 0442 12/27/16 0645  Weight: 158 lb 8 oz (71.9 kg) 165 lb 8 oz (75.1 kg) 162 lb 3.2 oz (73.6 kg)     Physical Exam   General: Well developed, well nourished, female appearing in no acute distress. Head: Normocephalic, atraumatic.  Neck: Supple without bruits, + JVD Lungs:  Resp regular and unlabored, CTA. Heart: RRR, S1, S2, no S3, S4, or murmur; no rub. Abdomen: Soft, non-tender, non-distended with normoactive bowel sounds. No hepatomegaly. No rebound/guarding. No obvious abdominal  masses. Extremities: No clubbing, cyanosis, 1+ edema. Distal pedal pulses are 1+ on left LE; Right AKA Neuro: Alert and oriented X 3. Moves all extremities spontaneously. Psych: Normal affect.  Labs    Chemistry Recent Labs Lab 12/25/16 0042 12/25/16 0449 12/26/16 0400  NA 138 139 138  K 4.5 4.5 3.9  CL 107 105 104  CO2  --  23 23  GLUCOSE 272* 197* 186*  BUN 41* 46* 50*  CREATININE 2.90* 2.60* 3.24*  CALCIUM  --  8.6* 8.5*  GFRNONAA  --  18* 14*  GFRAA  --  21* 16*  ANIONGAP  --  11 11     Hematology Recent Labs Lab 12/25/16 0449 12/26/16 0400 12/27/16 0532  WBC 11.7* 9.5 8.1  RBC 3.81* 3.50* 3.69*  HGB 9.7* 9.0* 9.4*  HCT 31.5* 29.1* 30.7*  MCV 82.7 83.1 83.2  MCH 25.5* 25.7* 25.5*  MCHC 30.8 30.9 30.6  RDW 12.9 13.1 13.0  PLT 319 299 299    Cardiac Enzymes Recent Labs Lab 12/25/16 0449 12/25/16 0941 12/25/16 1508  TROPONINI 4.53* 4.62* 4.61*    Recent Labs Lab 12/25/16 0040  TROPIPOC  0.53*     BNPNo results for input(s): BNP, PROBNP in the last 168 hours.   DDimer No results for input(s): DDIMER in the last 168 hours.   Radiology    US Renal  Result Date: 12/26/2016 CLINICAL DATA:  Stage IV chronic renal disease.  Initial encounter. EXAM: RENAL / URINARY TRACT ULTRASOUND COMPLETE COMPARISON:  Renal ultrasound performed 11/25/2015 FINDINGS: Right Kidney: Length: 11.3 cm. Mildly increased renal parenchymal echogenicity noted. A 1.4 cm mildly complex cystic focus is noted at the upper pole of the right kidney. No hydronephrosis visualized. Left Kidney: Length: 10.1 cm. Mildly increased renal parenchymal echogenicity noted. No mass or hydronephrosis visualized. Bladder: Appears normal for degree of bladder distention. Multiple stones are noted within the gallbladder, measuring up to 8 mm in size. IMPRESSION: 1. No evidence of hydronephrosis. 2. Mildly increased renal parenchymal echogenicity raises concern for medical renal disease. 3. 1.4 cm mildly  complex cystic focus at the upper pole of the right kidney is likely benign, only minimally changed from 2017. 4. Cholelithiasis.  Gallbladder otherwise unremarkable. Electronically Signed   By: Garald Balding M.D.   On: 12/26/2016 05:47     Telemetry    NSR in the 70s - Personally Reviewed  ECG    No new tracings - Personally Reviewed   Cardiac Studies   Echocardiogram 12/27/16: pending  Echo 07/27/15: Study Conclusions - Left ventricle: The cavity size was normal. Wall thickness was   increased in a pattern of mild LVH. The estimated ejection   fraction was 20%. Diffuse hypokinesis. Doppler parameters are   consistent with abnormal left ventricular relaxation (grade 1   diastolic dysfunction). Abnormal GLS -11.9%. - Aortic valve: Trileaflet; moderately calcified leaflets.   Sclerosis without stenosis. - Mitral valve: Mildly calcified annulus. There was trivial   regurgitation. - Left atrium: The atrium was mildly dilated. - Right ventricle: The cavity size was normal. Systolic function   was mildly reduced. - Pulmonary arteries: No complete TR doppler jet so unable to   estimate PA systolic pressure. - Systemic veins: IVC measured 2.1 cm with > 50% respirophasic   variation, suggesting RA pressure 8 mmHg.  Impressions: - Normal LV size with mild LV hypertrophy. EF 20%, diffuse   hypokinesis. Normal RV size with mildly decreased systolic   function. Aortic sclerosis without significant stenosis.  Patient Profile     66 y.o. female with a history significant for multivessel-CAD deemed to be a poor candidate for surgical or percutaneous revascularization (Prox LAD to Mid LAD lesion 90%, Ost Cx to Mid Cx lesion 95%, Ost 2nd Mrg to 2nd Mrg lesion 80%,Mid Cx lesion 90%, Prox RCA lesion 60%, Mid RCA lesion 50%), ICM leading to HFrEF (LVEF=20%, IDDM, HTN, dyslipidemia, prior CVA, and s/p right AKA who presented with chest discomfort in the setting of SBP>282mmHg  Assessment &  Plan    1. Hypertensive emergency: no evidence of neuro complication but pt with elevated troponin and AKI  BP improved, but still hypertensive: 140s/80s - Nitro gtt D/C'ed yesterday, no chest pain - Continue imdur 30mg  PO daily - Stop Toprol and add Coreg 6.25mg  BID for better BP control and titrate as needed.  If cannot get BP better controlled may need to add hydralazine. - lasix and losartan resumed    2. Elevated troponin -  favor demand ischemia over acute plaque rupture event-- pk troponin 4.62 which has been flat with no trend- unable to cath with AKI - Would not pursue cath due to  worsening renal function as well as the fact that patient has been deemed not to be a candidate for PCI or CABG.   - Continue medical management - needs aggressive Rx of HTN and also needs to be compliant with meds - continue home ASA and ticagrelor- she had been off ticagrelor - DVT ppx heparin SQ  3. Dyslipidemia - was on pravastatin as an outpt but doubt she was taking this as LDL was 197 - Changed to Lipitor 80mg  daily as we could not increase to crestor to 40mg  daily due to CKD - Will need repeat FLP and ALT in 6 weeks.   4. AKI; reduced GFR in the setting of HTN emergency  Cr continues to rise and now 3.51 - UA pending - nephrology is following  5. ICM with EF 20% and repeat echo 07/2015 with EF 20% and G1DD, mild LVH.  - admission weight 158 lbs, today she is 162 lbs ? accuracy; overall net negative 3.4L with 2.1 L urine output yesterday  -diuretic therapy per nephrology, currently on 80 mg lasix IV BID - consider changing to lasix PO -Imdur/ARB -change Toprol to Coreg 6.25mg  BID and titrate as needed to get better control of BP -not a candidate for spironolactone due to renal disease. -stop digoxin due to AKI -repeat echo as there has not been a followup since 2016  6. CAD per Dr. Prescott Gum in 03/2015 "coronary vessels are poor targets for grafting due to small size and diffuse disease  LV function is severely depressed and with poor targets would preclude beneficial response to CABG"  She is not a good candidate for PCI either.  7. IDDM glucose elevated at times.  - continue home basal insulin - SSI TIDAC and HS - A1C 8.2 will ask diabetic coordinator to see.   Signed, Tami Lin Duke , PA-C 9:22 AM 12/27/2016 Pager: [phone]  Patient seen and independently examined with Fabian Sharp, PA. We discussed all aspects of the encounter. I agree with the assessment and plan as stated above with some modifications.  Patient's BP improved but still elevated.  Will change Toprol to Coreg 6.25mg  BID which will give better BP control and also help with LV dysfunction.  Will titrate as BP tolerates.  Creatinine continues to rise on current dose of Lasix 80mg  IV BID.  Will await further recs from nephrology who is managing her diuretics.   Hopefully can d/c home in am if renal function improved and BP better.  Signed: Fransico Him, MD [facility] 12/27/2016

## 2016-12-28 DIAGNOSIS — I161 Hypertensive emergency: Principal | ICD-10-CM

## 2016-12-28 LAB — CBC
HCT: 27.8 % — ABNORMAL LOW (ref 36.0–46.0)
HEMOGLOBIN: 8.9 g/dL — AB (ref 12.0–15.0)
MCH: 26.7 pg (ref 26.0–34.0)
MCHC: 32 g/dL (ref 30.0–36.0)
MCV: 83.5 fL (ref 78.0–100.0)
Platelets: 270 10*3/uL (ref 150–400)
RBC: 3.33 MIL/uL — ABNORMAL LOW (ref 3.87–5.11)
RDW: 13.1 % (ref 11.5–15.5)
WBC: 8.6 10*3/uL (ref 4.0–10.5)

## 2016-12-28 LAB — BASIC METABOLIC PANEL
Anion gap: 11 (ref 5–15)
Anion gap: 10 (ref 5–15)
BUN: 59 mg/dL — AB (ref 6–20)
BUN: 62 mg/dL — ABNORMAL HIGH (ref 6–20)
CHLORIDE: 99 mmol/L — AB (ref 101–111)
CHLORIDE: 100 mmol/L — AB (ref 101–111)
CO2: 25 mmol/L (ref 22–32)
CO2: 28 mmol/L (ref 22–32)
CREATININE: 3.57 mg/dL — AB (ref 0.44–1.00)
Calcium: 8.4 mg/dL — ABNORMAL LOW (ref 8.9–10.3)
Calcium: 8.7 mg/dL — ABNORMAL LOW (ref 8.9–10.3)
Creatinine, Ser: 3.65 mg/dL — ABNORMAL HIGH (ref 0.44–1.00)
GFR calc Af Amer: 15 mL/min — ABNORMAL LOW (ref >60)
GFR calc non Af Amer: 13 mL/min — ABNORMAL LOW (ref >60)
GFR, EST AFRICAN AMERICAN: 14 mL/min — AB (ref >60)
GFR, EST NON AFRICAN AMERICAN: 12 mL/min — AB (ref >60)
GLUCOSE: 182 mg/dL — AB (ref 65–99)
Glucose, Bld: 105 mg/dL — ABNORMAL HIGH (ref 65–99)
POTASSIUM: 4.2 mmol/L (ref 3.5–5.1)
Potassium: 3.9 mmol/L (ref 3.5–5.1)
SODIUM: 138 mmol/L (ref 135–145)
Sodium: 135 mmol/L (ref 135–145)

## 2016-12-28 LAB — GLUCOSE, CAPILLARY
GLUCOSE-CAPILLARY: 139 mg/dL — AB (ref 65–99)
GLUCOSE-CAPILLARY: 162 mg/dL — AB (ref 65–99)
Glucose-Capillary: 180 mg/dL — ABNORMAL HIGH (ref 65–99)
Glucose-Capillary: 238 mg/dL — ABNORMAL HIGH (ref 65–99)

## 2016-12-28 LAB — PROTIME-INR
INR: 0.9
PROTHROMBIN TIME: 12.1 s (ref 11.4–15.2)

## 2016-12-28 MED ORDER — HYDRALAZINE HCL 25 MG PO TABS
25.0000 mg | ORAL_TABLET | Freq: Three times a day (TID) | ORAL | Status: DC
  Administered 2016-12-28 – 2017-01-03 (×19): 25 mg via ORAL
  Filled 2016-12-28 (×19): qty 1

## 2016-12-28 MED ORDER — SODIUM CHLORIDE 0.9 % IV SOLN
INTRAVENOUS | Status: DC
  Administered 2016-12-29: via INTRAVENOUS

## 2016-12-28 NOTE — Progress Notes (Signed)
Assessment: 1. CKD with acute component (uncertain baseline). Screat was 1.3 in Nov 2016. Sees a "kidney doctor" in [place].  2. Vol excess - improved, CXR clear  Cont. PO Furosemide. Stable from renal perspective. 3. Severe multi-vessel CAD - deemed not surgical/ PCI candidate in 2016 4. DM2 5. HTNive crisis - rx'd with IV NTG and po BP medications. Better now.   6. Severe ICM - EF 20% in 2016   Subjective: Interval History: none.  Objective: Vital signs in last 24 hours: Temp:  [97.7 F (36.5 C)-98.1 F (36.7 C)] 97.7 F (36.5 C) (04/11 1405) Pulse Rate:  [68-79] 76 (04/11 1405) Resp:  [17-19] 17 (04/11 1405) BP: (126-155)/(48-76) 126/48 (04/11 1405) SpO2:  [95 %-98 %] 98 % (04/11 1405) Weight:  [75.1 kg (165 lb 9.6 oz)] 75.1 kg (165 lb 9.6 oz) (04/11 0500) Weight change: 1.542 kg (3 lb 6.4 oz)  Intake/Output from previous day: 04/10 0701 - 04/11 0700 In: 1200 [P.O.:1200] Out: 1300 [Urine:1300] Intake/Output this shift: Total I/O In: 480 [P.O.:480] Out: 1175 [Urine:1175]  General appearance: alert and cooperative Back: symmetric, no curvature. ROM normal. No CVA tenderness. Resp: clear to auscultation bilaterally Cardio: regular rate and rhythm, S1, S2 normal, no murmur, click, rub or gallop Extremities: edema 1+  Lab Results:  Recent Labs  12/27/16 0532 12/28/16 0347  WBC 8.1 8.6  HGB 9.4* 8.9*  HCT 30.7* 27.8*  PLT 299 270   BMET:  Recent Labs  12/28/16 0347 12/28/16 1222  NA 135 138  K 3.9 4.2  CL 99* 100*  CO2 25 28  GLUCOSE 182* 105*  BUN 59* 62*  CREATININE 3.57* 3.65*  CALCIUM 8.4* 8.7*   No results for input(s): PTH in the last 72 hours. Iron Studies: No results for input(s): IRON, TIBC, TRANSFERRIN, FERRITIN in the last 72 hours. Studies/Results: No results found.  Scheduled: . aspirin  81 mg Oral Daily  . atorvastatin  80 mg Oral q1800  . carvedilol  6.25 mg Oral BID WC  . furosemide  80 mg Oral Daily  . heparin  subcutaneous  5,000 Units Subcutaneous Q8H  . hydrALAZINE  25 mg Oral Q8H  . insulin aspart  0-15 Units Subcutaneous TID WC  . insulin aspart  0-5 Units Subcutaneous QHS  . insulin aspart  5 Units Subcutaneous TID AC  . insulin glargine  25 Units Subcutaneous BID  . isosorbide mononitrate  30 mg Oral Daily  . losartan  100 mg Oral Daily     LOS: 3 days   [name] C 12/28/2016,2:10 PM

## 2016-12-28 NOTE — Progress Notes (Signed)
    [facility] has been requested to perform a transesophageal echocardiogram on Jenna Brown for thrombus.  After careful review of history and examination, the risks and benefits of transesophageal echocardiogram have been explained including risks of esophageal damage, perforation (1:10,000 risk), bleeding, pharyngeal hematoma as well as other potential complications associated with conscious sedation including aspiration, arrhythmia, respiratory failure and death. Alternatives to treatment were discussed, questions were answered. Patient is willing to proceed.   She will have TEE tomorrow at 11AM with Dr. Debara Pickett.  Tami Lin Angelly Spearing, [place]  12/28/2016 1:05 PM

## 2016-12-28 NOTE — Progress Notes (Signed)
PROGRESS NOTE    TRANAE Brown  CHE:[id number] DOB: 1951-01-19 DOA: 12/25/2016 PCP: [name], MD   Chief Complaint  Patient presents with  . Chest Pain     Brief Narrative:    Consulted for diabetes management.   Jenna Brown is a 66 y.o. female with medical history significant for remote CVA, severe PVD status post bilateral AKA, hypertension, severe multivessel CAD not candidate for CABG or PCI, ischemic cardiomyopathy with chronic systolic heart failure EF 20% based on echocardiogram 2016, chronic kidney disease followed by nephrology in [place], [place], and dyslipidemia. Patient was admitted by the cardiology service on 4/8 after presenting with chest pain setting of hypertensive urgency. Enzymes are positive with troponin greater than 4. She was also found to have worsened from 2016 baseline renal function so nephrology was consulted. Chest x-ray did not demonstrate any evidence of heart failure although patient reports orthopnea and edema symptoms (abdominal wall edema). Hemoglobin A1c was 8.2 and CBGs have been for the most part greater than 200 since arrival. We've been asked to assist in management of patient's diabetes.  Assessment & Plan   Diabetes mellitus, insulin dependent (IDDM), uncontrolled   Her outpatient control, patient had insurance issues and hence was not taking Lantus at home, has been started here on Lantus and dose adjusted along with sliding scale. Previous physician has called [facility] and received prior authorization for Lantus which is approved until April 2019. Continue present dose Lantus and sliding scale and she should be discharged on these doses with one-week follow-up with PCP.  TRH will sign off kindly call with any questions.  Lab Results  Component Value Date   HGBA1C 8.2 (H) 12/25/2016   CBG (last 3)   Recent Labs  12/27/16 1949 12/28/16 0756 12/28/16 1136  GLUCAP 220* 139* 162*     Acute kidney injury on  Chronic kidney disease (CKD), stage IV (severe)  -Nephrology on board managing the issue.  Essential hypertension -Blood pressure stable on present regimen of beta blocker, hydralazine and Imdur  Cardiomyopathy, ischemic/Acute on chronic systolic heart failure, NYHA class 3  -Being managed by primary team cardiology  NSTEMI (non-ST elevated myocardial infarction)/multivessel CAD in native artery/Unstable angina Management per primary team which is cardiology currently appears to be on beta blocker, statin, aspirin, Imdur, ARB and hydralazine for secondary prevention and chest pain-free.  HLD (hyperlipidemia)  Continue Crestor    DVT Prophylaxis  heparin  Code Status: Full  Family Communication: None at bedside  Disposition Plan: Admitted. Dispo per cardiology (primary)  Consultants [facility]  Procedures  Echocardiogram  Antibiotics   Anti-infectives    None      Subjective:   Jenna Brown seen and examined today.  Feeling better today, wishes to go home. Denies chest pain, shortness of breath, abdominal pain. States her sugars have been controlled at home and go over 200 every once in a while. Recently placed on Humalog due to cost issues with Lantus.  Objective:   Vitals:   12/27/16 1657 12/27/16 1900 12/28/16 0500 12/28/16 0844  BP: 126/61 134/67 (!) 141/76 (!) 155/65  Pulse:  79 78 68  Resp:  17 19   Temp:  97.7 F (36.5 C) 98.1 F (36.7 C)   TempSrc:  Axillary Oral   SpO2:  97% 95%   Weight:   75.1 kg (165 lb 9.6 oz)   Height:        Intake/Output Summary (Last 24 hours) at 12/28/16 1152 Last  data filed at 12/28/16 0925  Gross per 24 hour  Intake             1320 ml  Output             1300 ml  Net               20 ml   Filed Weights   12/26/16 0442 12/27/16 0645 12/28/16 0500  Weight: 75.1 kg (165 lb 8 oz) 73.6 kg (162 lb 3.2 oz) 75.1 kg (165 lb 9.6 oz)    Exam  General: Middle-aged African-American female lying in hospital bed in no  distress  HEENT: NCAT, mucous membranes moist.   Neck: Supple, + JVD, no masses  Cardiovascular: S1 S2 auscultated, RRR, no murmur  Respiratory: Clear to auscultation bilaterally with equal chest rise  Abdomen: Soft, nontender, nondistended, + bowel sounds  Extremities: warm dry without cyanosis clubbing. Right AKA  Neuro: AAOx3, nonfocal  Psych: Normal affect     Data Reviewed: I have personally reviewed following labs and imaging studies  CBC:  Recent Labs Lab 12/25/16 0027 12/25/16 0042 12/25/16 0449 12/26/16 0400 12/27/16 0532 12/28/16 0347  WBC 11.5*  --  11.7* 9.5 8.1 8.6  NEUTROABS 9.1*  --   --   --   --   --   HGB 10.4* 11.2* 9.7* 9.0* 9.4* 8.9*  HCT 33.2* 33.0* 31.5* 29.1* 30.7* 27.8*  MCV 83.4  --  82.7 83.1 83.2 83.5  PLT 317  --  319 299 299 509   Basic Metabolic Panel:  Recent Labs Lab 12/25/16 0042 12/25/16 0449 12/26/16 0400 12/27/16 0538 12/28/16 0347  NA 138 139 138 139 135  K 4.5 4.5 3.9 4.4 3.9  CL 107 105 104 106 99*  CO2  --  23 23 22 25   GLUCOSE 272* 197* 186* 112* 182*  BUN 41* 46* 50* 53* 59*  CREATININE 2.90* 2.60* 3.24* 3.51* 3.57*  CALCIUM  --  8.6* 8.5* 8.6* 8.4*   GFR: Estimated Creatinine Clearance: 16 mL/min (A) (by C-G formula based on SCr of 3.57 mg/dL (H)). Liver Function Tests: No results for input(s): AST, ALT, ALKPHOS, BILITOT, PROT, ALBUMIN in the last 168 hours. No results for input(s): LIPASE, AMYLASE in the last 168 hours. No results for input(s): AMMONIA in the last 168 hours. Coagulation Profile: No results for input(s): INR, PROTIME in the last 168 hours. Cardiac Enzymes:  Recent Labs Lab 12/25/16 0449 12/25/16 0941 12/25/16 1508  TROPONINI 4.53* 4.62* 4.61*   BNP (last 3 results) No results for input(s): PROBNP in the last 8760 hours. HbA1C: No results for input(s): HGBA1C in the last 72 hours. CBG:  Recent Labs Lab 12/27/16 1102 12/27/16 1636 12/27/16 1949 12/28/16 0756 12/28/16 1136    GLUCAP 165* 95 220* 139* 162*   Lipid Profile: No results for input(s): CHOL, HDL, LDLCALC, TRIG, CHOLHDL, LDLDIRECT in the last 72 hours. Thyroid Function Tests: No results for input(s): TSH, T4TOTAL, FREET4, T3FREE, THYROIDAB in the last 72 hours. Anemia Panel: No results for input(s): VITAMINB12, FOLATE, FERRITIN, TIBC, IRON, RETICCTPCT in the last 72 hours. Urine analysis:    Component Value Date/Time   COLORURINE YELLOW 08/13/2015 1023   APPEARANCEUR CLEAR 08/13/2015 1023   LABSPEC 1.016 08/13/2015 1023   PHURINE 5.5 08/13/2015 1023   GLUCOSEU NEGATIVE 08/13/2015 1023   HGBUR SMALL (A) 08/13/2015 1023   BILIRUBINUR NEGATIVE 08/13/2015 1023   KETONESUR NEGATIVE 08/13/2015 1023   PROTEINUR 100 (A) 08/13/2015 1023  UROBILINOGEN 0.2 04/10/2015 1248   NITRITE NEGATIVE 08/13/2015 1023   LEUKOCYTESUR NEGATIVE 08/13/2015 1023   Sepsis Labs: @LABRCNTIP (procalcitonin:4,lacticidven:4)  )No results found for this or any previous visit (from the past 240 hour(s)).    Radiology Studies: No results found.   Scheduled Meds: . aspirin  81 mg Oral Daily  . atorvastatin  80 mg Oral q1800  . carvedilol  6.25 mg Oral BID WC  . furosemide  80 mg Oral Daily  . heparin subcutaneous  5,000 Units Subcutaneous Q8H  . hydrALAZINE  25 mg Oral Q8H  . insulin aspart  0-15 Units Subcutaneous TID WC  . insulin aspart  0-5 Units Subcutaneous QHS  . insulin aspart  5 Units Subcutaneous TID AC  . insulin glargine  25 Units Subcutaneous BID  . isosorbide mononitrate  30 mg Oral Daily  . losartan  100 mg Oral Daily   Continuous Infusions:   LOS: 3 days   Time Spent in minutes   30 minutes  Signature  [name] M.D on 12/28/2016 at 11:52 AM  Between 7am to 7pm - Pager - [phone] ( page via [facility], text pages only, please mention full 10 digit call back number).  After 7pm go to [email or web address] - password [facility]

## 2016-12-28 NOTE — Progress Notes (Addendum)
Progress Note  Patient Name: Jenna Brown Date of Encounter: 12/28/2016  Primary Cardiologist: Dr. Haroldine Laws  Subjective   Patient is feeling well; denies chest pain, SOB, and palpitations. She would like to discharge home today; she states the LE edema is improved.  Inpatient Medications    Scheduled Meds: . aspirin  81 mg Oral Daily  . atorvastatin  80 mg Oral q1800  . carvedilol  6.25 mg Oral BID WC  . furosemide  80 mg Oral Daily  . heparin subcutaneous  5,000 Units Subcutaneous Q8H  . hydrALAZINE  10 mg Oral Q8H  . insulin aspart  0-15 Units Subcutaneous TID WC  . insulin aspart  0-5 Units Subcutaneous QHS  . insulin aspart  5 Units Subcutaneous TID AC  . insulin glargine  25 Units Subcutaneous BID  . isosorbide mononitrate  30 mg Oral Daily  . losartan  100 mg Oral Daily   Continuous Infusions:  PRN Meds: acetaminophen, ALPRAZolam, nitroGLYCERIN, ondansetron (ZOFRAN) IV   Vital Signs    Vitals:   12/27/16 1300 12/27/16 1657 12/27/16 1900 12/28/16 0500  BP: 138/71 126/61 134/67 (!) 141/76  Pulse: 72  79 78  Resp: 17  17 19   Temp: 98.1 F (36.7 C)  97.7 F (36.5 C) 98.1 F (36.7 C)  TempSrc: Oral  Axillary Oral  SpO2: 99%  97% 95%  Weight:    165 lb 9.6 oz (75.1 kg)  Height:        Intake/Output Summary (Last 24 hours) at 12/28/16 0846 Last data filed at 12/28/16 9381  Gross per 24 hour  Intake             1200 ml  Output             1300 ml  Net             -100 ml   Filed Weights   12/26/16 0442 12/27/16 0645 12/28/16 0500  Weight: 165 lb 8 oz (75.1 kg) 162 lb 3.2 oz (73.6 kg) 165 lb 9.6 oz (75.1 kg)     Physical Exam   General: Well developed, well nourished, female appearing in no acute distress. Head: Normocephalic, atraumatic.  Neck: Supple without bruits, no JVD. Lungs:  Resp regular and unlabored, CTA. Heart: RRR, S1, S2, no S3, S4, or murmur; no rub. Abdomen: Soft, non-tender, non-distended with normoactive bowel sounds. No  hepatomegaly. No rebound/guarding. No obvious abdominal masses. Extremities: No clubbing, cyanosis, Trace to 1+ edema. Distal pedal pulses are 1+ on left; right AKA Neuro: Alert and oriented X 3. Moves all extremities spontaneously. Psych: Normal affect.  Labs    Chemistry Recent Labs Lab 12/26/16 0400 12/27/16 0538 12/28/16 0347  NA 138 139 135  K 3.9 4.4 3.9  CL 104 106 99*  CO2 23 22 25   GLUCOSE 186* 112* 182*  BUN 50* 53* 59*  CREATININE 3.24* 3.51* 3.57*  CALCIUM 8.5* 8.6* 8.4*  GFRNONAA 14* 13* 13*  GFRAA 16* 15* 15*  ANIONGAP 11 11 11      Hematology Recent Labs Lab 12/26/16 0400 12/27/16 0532 12/28/16 0347  WBC 9.5 8.1 8.6  RBC 3.50* 3.69* 3.33*  HGB 9.0* 9.4* 8.9*  HCT 29.1* 30.7* 27.8*  MCV 83.1 83.2 83.5  MCH 25.7* 25.5* 26.7  MCHC 30.9 30.6 32.0  RDW 13.1 13.0 13.1  PLT 299 299 270    Cardiac Enzymes Recent Labs Lab 12/25/16 0449 12/25/16 0941 12/25/16 1508  TROPONINI 4.53* 4.62* 4.61*    Recent  Labs Lab 12/25/16 0040  TROPIPOC 0.53*     BNPNo results for input(s): BNP, PROBNP in the last 168 hours.   DDimer No results for input(s): DDIMER in the last 168 hours.   Radiology    No results found.   Telemetry    NSR in the 80s - Personally Reviewed  ECG    No new tracings - Personally Reviewed   Cardiac Studies   Echocardiogram 12/27/16:  Study Conclusions - Left ventricle: The cavity size was normal. Wall thickness was   increased in a pattern of mild LVH. Systolic function was mildly   reduced. The estimated ejection fraction was in the range of 45%   to 50%. Diffuse hypokinesis. Doppler parameters are consistent   with abnormal left ventricular relaxation (grade 1 diastolic   dysfunction). Doppler parameters are consistent with high   ventricular filling pressure. - Mitral valve: Calcified annulus.  Impressions: - Mild global reduction in LV systolic function; grade 1 diastolic   dysfunction; left atrial density may  be left atrial wall but   cannot exclude mass; suggest TEE to further assess.   Echo 07/27/15: Study Conclusions - Left ventricle: The cavity size was normal. Wall thickness was increased in a pattern of mild LVH. The estimated ejection fraction was 20%. Diffuse hypokinesis. Doppler parameters are consistent with abnormal left ventricular relaxation (grade 1 diastolic dysfunction). Abnormal GLS -11.9%. - Aortic valve: Trileaflet; moderately calcified leaflets. Sclerosis without stenosis. - Mitral valve: Mildly calcified annulus. There was trivial regurgitation. - Left atrium: The atrium was mildly dilated. - Right ventricle: The cavity size was normal. Systolic function was mildly reduced. - Pulmonary arteries: No complete TR doppler jet so unable to estimate PA systolic pressure. - Systemic veins: IVC measured 2.1 cm with > 50% respirophasic variation, suggesting RA pressure 8 mmHg.  Impressions: - Normal LV size with mild LV hypertrophy. EF 20%, diffuse hypokinesis. Normal RV size with mildly decreased systolic function. Aortic sclerosis without significant stenosis.   Patient Profile     66 y.o. female with a history significant for multivessel-CAD deemed to be a poor candidate for surgical or percutaneous revascularization (Prox LAD to Mid LAD lesion 90%, Ost Cx to Mid Cx lesion 95%, Ost 2nd Mrg to 2nd Mrg lesion 80%,Mid Cx lesion 90%, Prox RCA lesion 60%, Mid RCA lesion 50%), ICM leading to HFrEF (LVEF=20%, IDDM, HTN, dyslipidemia, prior CVA, and s/p right AKA who presented with chest discomfort in the setting of SBP>245mmHg  Assessment & Plan    1. Hypertensive emergency: no evidence of neuro complication but pt with elevated troponin and AKI BP improved, but still hypertensive: 140s/80s - Nitro gtt D/C'ed 12/26/16, no chest pain - Continue imdur 30mg  PO daily - Changed to Coreg 6.25mg  BID yesterday for better BP control. - lasix and losartan  resumed  - continue Hydralazine 25mg  TID - BP improved on current meds  2. Elevated troponin -  favor demand ischemia over acute plaque rupture event-- pk troponin 4.62 which has been flat with no trend- unable to cath with AKI - Would not pursue cath due to worsening renal function as well as the fact that patient has been deemed not to be a candidate for PCI or CABG.  - Continue medical management - needs aggressive Rx of HTN and also needs to be compliant with meds - continue home ASA and ticagrelor- she had been off ticagrelor - continue BB and statin - DVT ppx heparin SQ  3. Dyslipidemia - was on pravastatin  as an outpt but doubt she was taking this as LDL was 197 - Changed to Lipitor 80mg  daily as we could notincrease to crestor to 40mg  daily due to CKD - Will need repeat FLP and ALT in 6 weeks.   4. AKI; reduced GFR in the setting of HTN emergency  - sCr appears stable 3.57 (3.51) - Per Care Everywhere, creatinine was 2.66 at [facility] in March 2018 - nephrology is following  5. ICM with EF 20% and repeat echo 11/2016with EF 20% and G1DD, mild LVH.  - repeat echo this admission with improved EF of 45-50% and grade 1 DD - admission weight 158 lbs, today she is 165 lbs; overall net negative 3.3 L with 1.3 L urine output yesterday  -diuretic therapy per nephrology, currently on 80 mg lasix PO BID -Imdur/ARB/hydralazine -changed Toprol to Coreg 6.25mg  BID 12/26/16  -not a candidate for spironolactone due to renal disease. -stopped digoxin due to AKI  6. CAD per Dr. Prescott Gum in 03/2015 "coronary vessels are poor targets for grafting due to small size and diffuse disease LV function is severely depressed and with poor targets would preclude beneficial response to CABG" She is not a good candidate for PCI either.  7. IDDM glucose elevated at times.  - continue home basal insulin - SSI TIDAC and HS - A1C 8.2  And BS still elevated this am - TRH following  8. Left atrial  density but cannot exclude mass by recent echo.   - will get TEE tomorrow to assess further. - NPO after MN tonight   Signed, Ledora Bottcher , PA-C 8:46 AM 12/28/2016 Pager: [phone]  Patient seen and independently examined with Fabian Sharp, PA. We discussed all aspects of the encounter. I agree with the assessment and plan as stated above with modifications.  Patient's EF has improved to 45-50%.  Now on PO Lasix followed by Nephrology.  Creatinine remains elevated but stable. BP improved after changing to Coreg.  Will continue ARB/hydralazine/Coreg.  Echo showed ? LA mass.  Will make NPO after MN for TEE in am.    Signed: Fransico Him, MD [facility] 12/28/2016

## 2016-12-29 ENCOUNTER — Inpatient Hospital Stay (HOSPITAL_COMMUNITY): Payer: Medicare Other

## 2016-12-29 ENCOUNTER — Encounter (HOSPITAL_COMMUNITY)
Admission: EM | Disposition: A | Discharge status: Home / Self Care | Payer: Self-pay | Source: Home / Self Care | Attending: Cardiology

## 2016-12-29 DIAGNOSIS — I5189 Other ill-defined heart diseases: Secondary | ICD-10-CM

## 2016-12-29 LAB — CBC
HEMATOCRIT: 28.5 % — AB (ref 36.0–46.0)
HEMOGLOBIN: 8.8 g/dL — AB (ref 12.0–15.0)
MCH: 25.7 pg — ABNORMAL LOW (ref 26.0–34.0)
MCHC: 30.9 g/dL (ref 30.0–36.0)
MCV: 83.3 fL (ref 78.0–100.0)
Platelets: 287 10*3/uL (ref 150–400)
RBC: 3.42 MIL/uL — ABNORMAL LOW (ref 3.87–5.11)
RDW: 12.8 % (ref 11.5–15.5)
WBC: 7.9 10*3/uL (ref 4.0–10.5)

## 2016-12-29 LAB — BASIC METABOLIC PANEL
ANION GAP: 9 (ref 5–15)
ANION GAP: 9 (ref 5–15)
BUN: 59 mg/dL — ABNORMAL HIGH (ref 6–20)
BUN: 59 mg/dL — ABNORMAL HIGH (ref 6–20)
CHLORIDE: 105 mmol/L (ref 101–111)
CHLORIDE: 104 mmol/L (ref 101–111)
CO2: 25 mmol/L (ref 22–32)
CO2: 25 mmol/L (ref 22–32)
CREATININE: 3.41 mg/dL — AB (ref 0.44–1.00)
Calcium: 8.6 mg/dL — ABNORMAL LOW (ref 8.9–10.3)
Calcium: 8.5 mg/dL — ABNORMAL LOW (ref 8.9–10.3)
Creatinine, Ser: 3.28 mg/dL — ABNORMAL HIGH (ref 0.44–1.00)
GFR calc Af Amer: 16 mL/min — ABNORMAL LOW (ref >60)
GFR calc non Af Amer: 14 mL/min — ABNORMAL LOW (ref >60)
GFR calc non Af Amer: 13 mL/min — ABNORMAL LOW (ref >60)
GFR, EST AFRICAN AMERICAN: 15 mL/min — AB (ref >60)
GLUCOSE: 88 mg/dL (ref 65–99)
Glucose, Bld: 192 mg/dL — ABNORMAL HIGH (ref 65–99)
POTASSIUM: 4.4 mmol/L (ref 3.5–5.1)
Potassium: 4.5 mmol/L (ref 3.5–5.1)
SODIUM: 138 mmol/L (ref 135–145)
Sodium: 139 mmol/L (ref 135–145)

## 2016-12-29 LAB — GLUCOSE, CAPILLARY
GLUCOSE-CAPILLARY: 93 mg/dL (ref 65–99)
GLUCOSE-CAPILLARY: 246 mg/dL — AB (ref 65–99)
GLUCOSE-CAPILLARY: 210 mg/dL — AB (ref 65–99)
Glucose-Capillary: 93 mg/dL (ref 65–99)
Glucose-Capillary: 102 mg/dL — ABNORMAL HIGH (ref 65–99)

## 2016-12-29 SURGERY — INVASIVE LAB ABORTED CASE

## 2016-12-29 MED ORDER — BUTAMBEN-TETRACAINE-BENZOCAINE 2-2-14 % EX AERO
INHALATION_SPRAY | CUTANEOUS | Status: DC | PRN
Start: 2016-12-29 — End: 2016-12-29
  Administered 2016-12-29: 2 via TOPICAL

## 2016-12-29 MED ORDER — LIDOCAINE VISCOUS 2 % MT SOLN
OROMUCOSAL | Status: AC
  Filled 2016-12-29: qty 15

## 2016-12-29 MED ORDER — MIDAZOLAM HCL 10 MG/2ML IJ SOLN
INTRAMUSCULAR | Status: DC | PRN
  Administered 2016-12-29: 2 mg via INTRAVENOUS
  Administered 2016-12-29 (×3): 1 mg via INTRAVENOUS

## 2016-12-29 MED ORDER — FENTANYL CITRATE (PF) 100 MCG/2ML IJ SOLN
INTRAMUSCULAR | Status: DC | PRN
  Administered 2016-12-29 (×2): 25 ug via INTRAVENOUS

## 2016-12-29 MED ORDER — MIDAZOLAM HCL 5 MG/ML IJ SOLN
INTRAMUSCULAR | Status: AC
  Filled 2016-12-29: qty 2

## 2016-12-29 MED ORDER — FUROSEMIDE 40 MG PO TABS
40.0000 mg | ORAL_TABLET | Freq: Every day | ORAL | Status: DC
  Administered 2016-12-30 – 2017-01-03 (×5): 40 mg via ORAL
  Filled 2016-12-29 (×5): qty 1

## 2016-12-29 MED ORDER — FENTANYL CITRATE (PF) 100 MCG/2ML IJ SOLN
INTRAMUSCULAR | Status: AC
  Filled 2016-12-29: qty 2

## 2016-12-29 NOTE — Progress Notes (Addendum)
Progress Note  Patient Name: Jenna Brown Date of Encounter: 12/29/2016  Primary Cardiologist: Dr. Haroldine Laws  Subjective   Patient is feeling well; denies chest pain, SOB, and palpitations.   Inpatient Medications    Scheduled Meds: . aspirin  81 mg Oral Daily  . atorvastatin  80 mg Oral q1800  . carvedilol  6.25 mg Oral BID WC  . furosemide  80 mg Oral Daily  . heparin subcutaneous  5,000 Units Subcutaneous Q8H  . hydrALAZINE  25 mg Oral Q8H  . insulin aspart  0-15 Units Subcutaneous TID WC  . insulin aspart  0-5 Units Subcutaneous QHS  . insulin aspart  5 Units Subcutaneous TID AC  . insulin glargine  25 Units Subcutaneous BID  . isosorbide mononitrate  30 mg Oral Daily  . losartan  100 mg Oral Daily   Continuous Infusions: . sodium chloride 20 mL/hr at 12/29/16 0000   PRN Meds: acetaminophen, ALPRAZolam, nitroGLYCERIN, ondansetron (ZOFRAN) IV   Vital Signs    Vitals:   12/28/16 1755 12/28/16 2026 12/29/16 0300 12/29/16 0500  BP: 134/81 121/72 135/64 132/62  Pulse:  83 82 85  Resp:  (!) 21 19 17   Temp:  97.8 F (36.6 C) 97.6 F (36.4 C) 98.1 F (36.7 C)  TempSrc:  Oral Oral Oral  SpO2:  99% 100% 100%  Weight:   165 lb 4.8 oz (75 kg)   Height:        Intake/Output Summary (Last 24 hours) at 12/29/16 0815 Last data filed at 12/29/16 0700  Gross per 24 hour  Intake              860 ml  Output             2475 ml  Net            -1615 ml   Filed Weights   12/27/16 0645 12/28/16 0500 12/29/16 0300  Weight: 162 lb 3.2 oz (73.6 kg) 165 lb 9.6 oz (75.1 kg) 165 lb 4.8 oz (75 kg)     Physical Exam   General: Well developed, well nourished, female appearing in no acute distress. Head: Normocephalic, atraumatic.  Neck: Supple without bruits, no JVD Lungs:  Resp regular and unlabored, CTA. Heart: RRR, S1, S2, no S3, S4, or murmur; no rub. Abdomen: Soft, non-tender, non-distended with normoactive bowel sounds. No hepatomegaly. No rebound/guarding. No  obvious abdominal masses. Extremities: No clubbing, cyanosis, 1+ edema on left. Distal pedal pulses are 1+ on left. Right AKA Neuro: Alert and oriented X 3. Moves all extremities spontaneously. Psych: Normal affect.  Labs    Chemistry Recent Labs Lab 12/27/16 0538 12/28/16 0347 12/28/16 1222  NA 139 135 138  K 4.4 3.9 4.2  CL 106 99* 100*  CO2 22 25 28   GLUCOSE 112* 182* 105*  BUN 53* 59* 62*  CREATININE 3.51* 3.57* 3.65*  CALCIUM 8.6* 8.4* 8.7*  GFRNONAA 13* 13* 12*  GFRAA 15* 15* 14*  ANIONGAP 11 11 10      Hematology Recent Labs Lab 12/27/16 0532 12/28/16 0347 12/29/16 0512  WBC 8.1 8.6 7.9  RBC 3.69* 3.33* 3.42*  HGB 9.4* 8.9* 8.8*  HCT 30.7* 27.8* 28.5*  MCV 83.2 83.5 83.3  MCH 25.5* 26.7 25.7*  MCHC 30.6 32.0 30.9  RDW 13.0 13.1 12.8  PLT 299 270 287    Cardiac Enzymes Recent Labs Lab 12/25/16 0449 12/25/16 0941 12/25/16 1508  TROPONINI 4.53* 4.62* 4.61*    Recent Labs Lab 12/25/16 0040  TROPIPOC 0.53*     BNPNo results for input(s): BNP, PROBNP in the last 168 hours.   DDimer No results for input(s): DDIMER in the last 168 hours.   Radiology    No results found.   Telemetry    NSR - Personally Reviewed  ECG    No new tracings - Personally Reviewed   Cardiac Studies   TEE 12/29/16: pending  Echocardiogram 12/27/16:  Study Conclusions - Left ventricle: The cavity size was normal. Wall thickness was increased in a pattern of mild LVH. Systolic function was mildly reduced. The estimated ejection fraction was in the range of 45% to 50%. Diffuse hypokinesis. Doppler parameters are consistent with abnormal left ventricular relaxation (grade 1 diastolic dysfunction). Doppler parameters are consistent with high ventricular filling pressure. - Mitral valve: Calcified annulus.  Impressions: - Mild global reduction in LV systolic function; grade 1 diastolic dysfunction; left atrial density may be left atrial wall  but cannot exclude mass; suggest TEE to further assess.   Echo 07/27/15: Study Conclusions - Left ventricle: The cavity size was normal. Wall thickness was increased in a pattern of mild LVH. The estimated ejection fraction was 20%. Diffuse hypokinesis. Doppler parameters are consistent with abnormal left ventricular relaxation (grade 1 diastolic dysfunction). Abnormal GLS -11.9%. - Aortic valve: Trileaflet; moderately calcified leaflets. Sclerosis without stenosis. - Mitral valve: Mildly calcified annulus. There was trivial regurgitation. - Left atrium: The atrium was mildly dilated. - Right ventricle: The cavity size was normal. Systolic function was mildly reduced. - Pulmonary arteries: No complete TR doppler jet so unable to estimate PA systolic pressure. - Systemic veins: IVC measured 2.1 cm with > 50% respirophasic variation, suggesting RA pressure 8 mmHg.  Impressions: - Normal LV size with mild LV hypertrophy. EF 20%, diffuse hypokinesis. Normal RV size with mildly decreased systolic function. Aortic sclerosis without significant stenosis.   Patient Profile     66 y.o. female with a history significant for multivessel-CAD deemed to be a poor candidate for surgical or percutaneous revascularization (Prox LAD to Mid LAD lesion 90%, Ost Cx to Mid Cx lesion 95%, Ost 2nd Mrg to 2nd Mrg lesion 80%,Mid Cx lesion 90%, Prox RCA lesion 60%, Mid RCA lesion 50%), ICM leading to HFrEF (LVEF=20%, IDDM, HTN, dyslipidemia, prior CVA, and s/p right AKA who presented with chest discomfort in the setting of SBP>221mmHg  Assessment & Plan    1.Hypertensive emergency: no evidence of neuro complication but pt with elevated troponin and AKI BP improved, but still hypertensive: 140s/80s - Continue imdur 30mg  PO daily and  Coreg 6.25mg  BID for better BP control. - Continue lasix, hydralazine and losartan  - BP improved on current meds  2. Elevated troponin-  favor demand ischemia over acute plaque rupture event-- pk troponin 4.62 which has beenflat with no trend- unable to cath with AKI - Would not pursue cath due to worsening renal function as well as the fact that patient has been deemed not to be a candidate for PCI or CABG.  - Continue medical management - needs aggressive Rx of HTN and also needs to be compliant with meds - continue home ASA and ticagrelor- she had been off ticagrelor - continue BB and statin - DVT ppx heparin SQ  3.Dyslipidemia - was on pravastatin as an outpt but doubt she was taking this as LDL was 197 - Changed to Lipitor 80mg  daily as we could notincrease to crestor to 40mg  daily due to CKD - Will need repeat FLP and ALT in  6 weeks.   4.AKI;reduced GFR in the setting of HTN emergency  - BMP pending this am but increased to 3.65 yesterday. - will await further recs from nephrology - Per Care Everywhere, creatinine was 2.66 at [facility] in March 2018 - nephrology is following  5.ICM with EF 20%and repeat echo 11/2016with EF 20% and G1DD, mild LVH.  -repeat echo this admission with improved EF of 45-50% and grade 1 DD -admission weight 158 lbs, today she is 165 lbs; overall net negative 5 L with 2.4 L urine output yesterday  -diuretic therapy per nephrology, currently on 80 mg lasix PO BID -continue Imdur/ARB/hydralazine/Coreg -not a candidate for spironolactone due to renal disease. -stopped digoxin due to AKI  6.CADper Dr. Prescott Gum in 03/2015 "coronary vessels are poor targets for grafting due to small size and diffuse disease LV function is severely depressed and with poor targets would preclude beneficial response to CABG" She is not a good candidate for PCI either.  7.IDDM glucose elevated at times.  - continue  Insulin -TRH managing - SSI TIDAC and HS - A1C 8.2  BS improved this am  8. Left atrial density but cannot exclude mass by recent echo.   - will get TEE today to assess  further.   Signed, Tami Lin Duke , PA-C 8:15 AM 12/29/2016 Pager: [phone]  Patient seen and independently examined with Fabian Sharp, PA. We discussed all aspects of the encounter. I agree with the assessment and plan as stated above.  Patient scheduled for TEE today to assess ? LA mass.  She continues to diurese well and is net neg 5L.  Her creatinine has continued to increase.  BMET pending yet today.  I am going to back of of Lasix down to 40mg  daily which is her home dose.  No new recs from nephrology despite creatinine continuing to rise.  BS much improved with changes in insulin - appreciate TRH input.  BP well controlled on current meds.  No chest pain.  Continue current antianginal therapy.  Signed: Fransico Him, MD [facility] 12/29/2016

## 2016-12-29 NOTE — H&P (Signed)
    INTERVAL PROCEDURE H&P  History and Physical Interval Note:  12/29/2016 9:58 AM  Jenna Brown has presented today for their planned procedure. The various methods of treatment have been discussed with the patient and family. After consideration of risks, benefits and other options for treatment, the patient has consented to the procedure.  The patients' outpatient history has been reviewed, patient examined, and no change in status from most recent office note within the past 30 days. I have reviewed the patients' chart and labs and will proceed as planned. Questions were answered to the patient's satisfaction.   Pixie Casino, MD, [facility] Attending Cardiologist [place] [facility] 12/29/2016, 9:58 AM

## 2016-12-29 NOTE — CV Procedure (Addendum)
    TRANSESOPHAGEAL ECHOCARDIOGRAM (TEE) NOTE  INDICATIONS: possible LA mass seen on echo  PROCEDURE:   Informed consent was obtained prior to the procedure. The risks, benefits and alternatives for the procedure were discussed and the patient comprehended these risks.  Risks include, but are not limited to, cough, sore throat, vomiting, nausea, somnolence, esophageal and stomach trauma or perforation, bleeding, low blood pressure, aspiration, pneumonia, infection, trauma to the teeth and death.    After a procedural time-out, the patient was given 5 mg versed and 50 mcg fentanyl for moderate sedation.  The patient's heart rate, blood pressure, and oxygen saturation are monitored continuously during the procedure.The oropharynx was anesthetized with 2 cetacaine sprays.  The transesophageal probe was not able to be inserted.  The patient was kept under observation until the patient left the procedure room.  The period of conscious sedation is 15 minutes, of which I was present face-to-face 100% of this time. The patient left the procedure room in stable condition.   Agitated microbubble saline contrast was not administered.  COMPLICATIONS:    The TEE probe could not be inserted due to gag, edentulation, small posterior oropharynx and inability to achieve deeper sedation.  RECOMMENDATIONS:    1.  Consider repeat TEE with MAC or CT angiography to evaluate for LA mass.  Time Spent Directly with the Patient:  30 minutes   Pixie Casino, MD, [facility] Attending Cardiologist [facility]  12/29/2016, 10:42 AM

## 2016-12-29 NOTE — Progress Notes (Signed)
Patient is rescheduled for TEE with anesthesia assistance  On Friday, April 13th @ 1300. Please let Endo know if TEE is not longer needed.

## 2016-12-29 NOTE — Progress Notes (Signed)
Assessment: 1. CKD with acute component (uncertain baseline). Screat was 1.3 in Nov 2016. Sees a "kidney doctor" in [place].  2. Vol excess - stable 3. Severe multi-vessel CAD - deemed not surgical/ PCI candidate in 2016 4. DM2 5. HTNive crisis - improved 6. ICM 45%, ? LA density by echo  Subjective: Interval History: Nothing new  Objective: Vital signs in last 24 hours: Temp:  [97.5 F (36.4 C)-98.1 F (36.7 C)] 97.5 F (36.4 C) (04/12 1058) Pulse Rate:  [70-96] 81 (04/12 1058) Resp:  [13-21] 19 (04/12 1058) BP: (121-237)/(48-87) 156/84 (04/12 1110) SpO2:  [94 %-100 %] 99 % (04/12 1117) Weight:  [75 kg (165 lb 4.8 oz)] 75 kg (165 lb 4.8 oz) (04/12 0300) Weight change: -0.136 kg (-4.8 oz)  Intake/Output from previous day: 04/11 0701 - 04/12 0700 In: 860 [P.O.:720; I.V.:140] Out: 2475 [Urine:2475] Intake/Output this shift: No intake/output data recorded.  General appearance: alert and cooperative Resp: clear to auscultation bilaterally Cardio: regular rate and rhythm, S1, S2 normal, no murmur, click, rub or gallop Extremities: edema 1+  Lab Results:  Recent Labs  12/28/16 0347 12/29/16 0512  WBC 8.6 7.9  HGB 8.9* 8.8*  HCT 27.8* 28.5*  PLT 270 287   BMET:  Recent Labs  12/28/16 0347 12/28/16 1222  NA 135 138  K 3.9 4.2  CL 99* 100*  CO2 25 28  GLUCOSE 182* 105*  BUN 59* 62*  CREATININE 3.57* 3.65*  CALCIUM 8.4* 8.7*   No results for input(s): PTH in the last 72 hours. Iron Studies: No results for input(s): IRON, TIBC, TRANSFERRIN, FERRITIN in the last 72 hours. Studies/Results: No results found.  Scheduled: . aspirin  81 mg Oral Daily  . atorvastatin  80 mg Oral q1800  . carvedilol  6.25 mg Oral BID WC  . furosemide  80 mg Oral Daily  . heparin subcutaneous  5,000 Units Subcutaneous Q8H  . hydrALAZINE  25 mg Oral Q8H  . insulin aspart  0-15 Units Subcutaneous TID WC  . insulin aspart  0-5 Units Subcutaneous QHS  . insulin aspart  5 Units  Subcutaneous TID AC  . insulin glargine  25 Units Subcutaneous BID  . isosorbide mononitrate  30 mg Oral Daily  . losartan  100 mg Oral Daily    LOS: 4 days   Kyandre Okray C 12/29/2016,11:54 AM

## 2016-12-29 NOTE — OR Nursing (Signed)
Dr Debara Pickett unable to pass TEE probe after several attempts

## 2016-12-30 ENCOUNTER — Other Ambulatory Visit: Payer: Self-pay | Admitting: Physician Assistant

## 2016-12-30 DIAGNOSIS — N189 Chronic kidney disease, unspecified: Secondary | ICD-10-CM

## 2016-12-30 LAB — CBC
HEMATOCRIT: 27.8 % — AB (ref 36.0–46.0)
HEMOGLOBIN: 9 g/dL — AB (ref 12.0–15.0)
MCH: 26.9 pg (ref 26.0–34.0)
MCHC: 32.4 g/dL (ref 30.0–36.0)
MCV: 83.2 fL (ref 78.0–100.0)
Platelets: 279 10*3/uL (ref 150–400)
RBC: 3.34 MIL/uL — AB (ref 3.87–5.11)
RDW: 13 % (ref 11.5–15.5)
WBC: 8.8 10*3/uL (ref 4.0–10.5)

## 2016-12-30 LAB — BASIC METABOLIC PANEL
Anion gap: 10 (ref 5–15)
BUN: 62 mg/dL — AB (ref 6–20)
CALCIUM: 8.5 mg/dL — AB (ref 8.9–10.3)
CHLORIDE: 103 mmol/L (ref 101–111)
CO2: 25 mmol/L (ref 22–32)
CREATININE: 3.32 mg/dL — AB (ref 0.44–1.00)
GFR, EST AFRICAN AMERICAN: 16 mL/min — AB (ref >60)
GFR, EST NON AFRICAN AMERICAN: 14 mL/min — AB (ref >60)
GLUCOSE: 207 mg/dL — AB (ref 65–99)
Potassium: 4.3 mmol/L (ref 3.5–5.1)
SODIUM: 138 mmol/L (ref 135–145)

## 2016-12-30 LAB — GLUCOSE, CAPILLARY
GLUCOSE-CAPILLARY: 172 mg/dL — AB (ref 65–99)
GLUCOSE-CAPILLARY: 171 mg/dL — AB (ref 65–99)
Glucose-Capillary: 196 mg/dL — ABNORMAL HIGH (ref 65–99)
Glucose-Capillary: 217 mg/dL — ABNORMAL HIGH (ref 65–99)

## 2016-12-30 NOTE — Plan of Care (Signed)
Problem: Safety: Goal: Ability to remain free from injury will improve Outcome: Progressing Patient has remained free from injury. BSC at bedside. Patient pivots self.   Problem: Pain Managment: Goal: General experience of comfort will improve Outcome: Progressing Tylenol given for sore throat post TEE attempt, relief noted.   Problem: Fluid Volume: Goal: Ability to maintain a balanced intake and output will improve Outcome: Progressing Strict I&O's.

## 2016-12-30 NOTE — Progress Notes (Signed)
Assessment: 1. CKD with acute component (uncertain baseline). Screat was 1.3 in Nov 2016. Sees a "kidney doctor" in [place].  2. Vol excess - stable 3. Severe multi-vessel CAD - deemed not surgical/ PCI candidate in 2016 4. DM2 5. HTNive crisis - improved 6. ICM 45%, ? LA density by echo  Subjective: Interval History: Repeat TEE planned  Objective: Vital signs in last 24 hours: Temp:  [98 F (36.7 C)-98.8 F (37.1 C)] 98 F (36.7 C) (04/13 0530) Pulse Rate:  [84-90] 90 (04/13 0530) Resp:  [15-19] 18 (04/13 0530) BP: (112-142)/(60-75) 142/60 (04/13 0530) SpO2:  [95 %-99 %] 95 % (04/13 0530) Weight:  [73.7 kg (162 lb 6.4 oz)] 73.7 kg (162 lb 6.4 oz) (04/13 0530) Weight change: -1.315 kg (-2 lb 14.4 oz)  Intake/Output from previous day: 04/12 0701 - 04/13 0700 In: 440 [P.O.:440] Out: 1450 [Urine:1450] Intake/Output this shift: No intake/output data recorded.  General appearance: alert and cooperative Back: negative, symmetric, no curvature. ROM normal. No CVA tenderness. Chest wall: no tenderness Cardio: regular rate and rhythm, S1, S2 normal, no murmur, click, rub or gallop Extremities: edema 1+  Lab Results:  Recent Labs  12/29/16 0512 12/30/16 0408  WBC 7.9 8.8  HGB 8.8* 9.0*  HCT 28.5* 27.8*  PLT 287 279   BMET:  Recent Labs  12/29/16 1349 12/30/16 0408  NA 138 138  K 4.5 4.3  CL 104 103  CO2 25 25  GLUCOSE 192* 207*  BUN 59* 62*  CREATININE 3.41* 3.32*  CALCIUM 8.5* 8.5*   No results for input(s): PTH in the last 72 hours. Iron Studies: No results for input(s): IRON, TIBC, TRANSFERRIN, FERRITIN in the last 72 hours. Studies/Results: No results found.  Scheduled: . aspirin  81 mg Oral Daily  . atorvastatin  80 mg Oral q1800  . carvedilol  6.25 mg Oral BID WC  . furosemide  40 mg Oral Daily  . heparin subcutaneous  5,000 Units Subcutaneous Q8H  . hydrALAZINE  25 mg Oral Q8H  . insulin aspart  0-15 Units Subcutaneous TID WC  . insulin  aspart  0-5 Units Subcutaneous QHS  . insulin aspart  5 Units Subcutaneous TID AC  . insulin glargine  25 Units Subcutaneous BID  . isosorbide mononitrate  30 mg Oral Daily  . losartan  100 mg Oral Daily    LOS: 5 days   Margaree Sandhu C 12/30/2016,1:32 PM

## 2016-12-30 NOTE — Care Management Important Message (Signed)
Important Message  Patient Details  Name: Jenna Brown MRN: [id number] Date of Birth: [birth year]   Medicare Important Message Given:  Yes    Nathen May 12/30/2016, 2:00 PM

## 2016-12-30 NOTE — Progress Notes (Addendum)
Progress Note  Patient Name: Jenna Brown Date of Encounter: 12/30/2016  Primary Cardiologist: Dr. Haroldine Laws  Subjective   Patient is feeling well; denies chest pain, SOB, and palpitations.  Inpatient Medications    Scheduled Meds: . aspirin  81 mg Oral Daily  . atorvastatin  80 mg Oral q1800  . carvedilol  6.25 mg Oral BID WC  . furosemide  40 mg Oral Daily  . heparin subcutaneous  5,000 Units Subcutaneous Q8H  . hydrALAZINE  25 mg Oral Q8H  . insulin aspart  0-15 Units Subcutaneous TID WC  . insulin aspart  0-5 Units Subcutaneous QHS  . insulin aspart  5 Units Subcutaneous TID AC  . insulin glargine  25 Units Subcutaneous BID  . isosorbide mononitrate  30 mg Oral Daily  . losartan  100 mg Oral Daily   Continuous Infusions:  PRN Meds: acetaminophen, ALPRAZolam, nitroGLYCERIN, ondansetron (ZOFRAN) IV   Vital Signs    Vitals:   12/29/16 1336 12/29/16 2100 12/29/16 2135 12/30/16 0530  BP: 132/68 112/62 129/75 (!) 142/60  Pulse: 84 87  90  Resp: _0 Temp: 98.1 F (36.7 C) 98.8 F (37.1 C)  98 F (36.7 C)  TempSrc: Oral Oral  Oral  SpO2: 99% 98%  95%  Weight:    162 lb 6.4 oz (73.7 kg)  Height:        Intake/Output Summary (Last 24 hours) at 12/30/16 0823 Last data filed at 12/29/16 1900  Gross per 24 hour  Intake              440 ml  Output              850 ml  Net             -410 ml   Filed Weights   12/28/16 0500 12/29/16 0300 12/30/16 0530  Weight: 165 lb 9.6 oz (75.1 kg) 165 lb 4.8 oz (75 kg) 162 lb 6.4 oz (73.7 kg)     Physical Exam   General: Well developed, well nourished, female appearing in no acute distress. Head: Normocephalic, atraumatic.  Neck: Supple without bruits, JVD Lungs:  Resp regular and unlabored, CTA. Heart: RRR, S1, S2, no murmur; no rub. Abdomen: Soft, non-tender, non-distended with normoactive bowel sounds. No hepatomegaly. No rebound/guarding. No obvious abdominal masses. Extremities: No clubbing, cyanosis,  trace edema. Distal pedal pulses are 1+ on left. Right AKA Neuro: Alert and oriented X 3. Moves all extremities spontaneously. Psych: Normal affect.  Labs    Chemistry Recent Labs Lab 12/29/16 1151 12/29/16 1349 12/30/16 0408  NA 139 138 138  K 4.4 4.5 4.3  CL 105 104 103  CO2 _1 GLUCOSE 88 192* 207*  BUN 59* 59* 62*  CREATININE 3.28* 3.41* 3.32*  CALCIUM 8.6* 8.5* 8.5*  GFRNONAA 14* 13* 14*  GFRAA 16* 15* 16*  ANIONGAP _2 Hematology Recent Labs Lab 12/28/16 0347 12/29/16 0512 12/30/16 0408  WBC 8.6 7.9 8.8  RBC 3.33* 3.42* 3.34*  HGB 8.9* 8.8* 9.0*  HCT 27.8* 28.5* 27.8*  MCV 83.5 83.3 83.2  MCH 26.7 25.7* 26.9  MCHC 32.0 30.9 32.4  RDW 13.1 12.8 13.0  PLT 270 287 279    Cardiac Enzymes Recent Labs Lab 12/25/16 0449 12/25/16 0941 12/25/16 1508  TROPONINI 4.53* 4.62* 4.61*    Recent Labs Lab 12/25/16 0040  TROPIPOC 0.53*     BNPNo results for input(s): BNP, PROBNP in  the last 168 hours.   DDimer No results for input(s): DDIMER in the last 168 hours.   Radiology    No results found.   Telemetry    NSR - Personally Reviewed  ECG    No new tracings - Personally Reviewed   Cardiac Studies   TEE 12/30/16: pending  Echocardiogram 12/27/16:  Study Conclusions - Left ventricle: The cavity size was normal. Wall thickness was increased in a pattern of mild LVH. Systolic function was mildly reduced. The estimated ejection fraction was in the range of 45% to 50%.Diffuse hypokinesis. Doppler parameters are consistent with abnormal left ventricular relaxation (grade 1 diastolic dysfunction). Doppler parameters are consistent with high ventricular filling pressure. - Mitral valve: Calcified annulus.  Impressions: - Mild global reduction in LV systolic function; grade 1 diastolic dysfunction; left atrial density may be left atrial wall but cannot exclude mass; suggest TEE to further assess.   Echo  07/27/15: Study Conclusions - Left ventricle: The cavity size was normal. Wall thickness was increased in a pattern of mild LVH. The estimated ejection fraction was 20%. Diffuse hypokinesis. Doppler parameters are consistent with abnormal left ventricular relaxation (grade 1 diastolic dysfunction). Abnormal GLS -11.9%. - Aortic valve: Trileaflet; moderately calcified leaflets. Sclerosis without stenosis. - Mitral valve: Mildly calcified annulus. There was trivial regurgitation. - Left atrium: The atrium was mildly dilated. - Right ventricle: The cavity size was normal. Systolic function was mildly reduced. - Pulmonary arteries: No complete TR doppler jet so unable to estimate PA systolic pressure. - Systemic veins: IVC measured 2.1 cm with > 50% respirophasic variation, suggesting RA pressure 8 mmHg.  Impressions: - Normal LV size with mild LV hypertrophy. EF 20%, diffuse hypokinesis. Normal RV size with mildly decreased systolic function. Aortic sclerosis without significant stenosis.  Patient Profile     66 y.o. female with a history significant for multivessel-CAD deemed to be a poor candidate for surgical or percutaneous revascularization (Prox LAD to Mid LAD lesion 90%, Ost Cx to Mid Cx lesion 95%, Ost 2nd Mrg to 2nd Mrg lesion 80%,Mid Cx lesion 90%, Prox RCA lesion 60%, Mid RCA lesion 50%), ICM leading to HFrEF (LVEF=20%, IDDM, HTN, dyslipidemia, prior CVA, and s/p right AKA who presented with chest discomfort in the setting of SBP>235mHg  Assessment & Plan    1.Hypertensive emergency:  - no evidence of neuro complication but pt with elevated troponin and AKI  - BP improved with systolic BP in the 1408-144Y- Continue imdur 341mPO daily, Coreg 6.2538mID, hydralazine 25m2mD, and losartan 100 mg daily   2. Elevated troponin- favor demand ischemia over acute plaque rupture event-- pk troponin 4.62 which has beenflat with no trend- unable to cath  with AKI - Would not pursue cath due to worsening renal function as well as the fact that patient has been deemed not to be a candidate for PCI or CABG.  - Continue medical management - needs aggressive Rx of HTN and also needs to be compliant with meds - continue home ASA  - continue BB and statin - DVT ppx heparin SQ - she had been off ticagrelor - consider restarting after TEE today  3.Dyslipidemia - was on pravastatin as an outpt but doubt she was taking this as LDL was 197 - Changed to Lipitor 80mg22mly as we could notincrease to crestor to 40mg 31my due to CKD - Will need repeat FLP and ALT in 6 weeks.   4.AKI;reduced GFR in the setting of HTN emergency  -  sCr leveling off to 3.32 (3.41) after changing back to home lasix dose - will await further recs from nephrology - Per Care Everywhere, creatinine was 2.66 at [facility] in March 2018 - nephrology is following  5.ICM with EF 20%and repeat echo 11/2016with EF 20% and G1DD, mild LVH.  -repeat echo this admission with improved EF of 45-50% and grade 1 DD -admission weight 158 lbs, today she is 162lbs; overall net negative 6L with 1.4 L urine output yesterday  -diuretic therapy per nephrology, currently on 80 mg lasix POBID -not a candidate for spironolactone due to renal disease. -stoppeddigoxin due to AKI  6.CADper Dr. Prescott Gum in 03/2015 "coronary vessels are poor targets for grafting due to small size and diffuse disease LV function is severely depressed and with poor targets would preclude beneficial response to CABG" She is not a good candidate for PCI either.  7.IDDM glucose elevated at times.  - continue  Insulin -TRH managing - SSI TIDAC and HS - A1C 8.2 BS improved but still mildly elevated on BMET this am  8. Left atrial densitybut cannot exclude mass by recent echo.  - TEE pending for today under general anesthesia - TEE under conscious sedation unable to pass probe yesterday - pt NPO this  morning, had very minimal PO intake at [address] [place] , [place] 8:23 AM 12/30/2016 Pager: [phone]  Patient seen and independently examined with Fabian Sharp, PA. We discussed all aspects of the encounter. I agree with the assessment and plan as stated above.  Patient was scheduled to have TEE under anesthesia to rule out LA mass but ate this am.  Anesthesia refuses to do TEE today.  Cannot do CT due to acute on CKD.  Patient tentatively scheduled for TEE under MAC on Monday.  Still watching renal function. Will continue to watch renal function.  If renal function continues to improve could possibly be discharged over the weekend and come back Monday for TEE.  Signed: Fransico Him, MD [facility] 12/30/2016

## 2016-12-31 ENCOUNTER — Inpatient Hospital Stay (HOSPITAL_COMMUNITY): Payer: Medicare Other

## 2016-12-31 LAB — BASIC METABOLIC PANEL
ANION GAP: 12 (ref 5–15)
BUN: 56 mg/dL — AB (ref 6–20)
CALCIUM: 9 mg/dL (ref 8.9–10.3)
CO2: 26 mmol/L (ref 22–32)
CREATININE: 3.34 mg/dL — AB (ref 0.44–1.00)
Chloride: 101 mmol/L (ref 101–111)
GFR calc Af Amer: 16 mL/min — ABNORMAL LOW (ref >60)
GFR calc non Af Amer: 14 mL/min — ABNORMAL LOW (ref >60)
GLUCOSE: 231 mg/dL — AB (ref 65–99)
Potassium: 4.6 mmol/L (ref 3.5–5.1)
Sodium: 139 mmol/L (ref 135–145)

## 2016-12-31 LAB — CBC
HEMATOCRIT: 29.2 % — AB (ref 36.0–46.0)
Hemoglobin: 8.9 g/dL — ABNORMAL LOW (ref 12.0–15.0)
MCH: 25.4 pg — ABNORMAL LOW (ref 26.0–34.0)
MCHC: 30.5 g/dL (ref 30.0–36.0)
MCV: 83.4 fL (ref 78.0–100.0)
Platelets: 295 10*3/uL (ref 150–400)
RBC: 3.5 MIL/uL — ABNORMAL LOW (ref 3.87–5.11)
RDW: 12.9 % (ref 11.5–15.5)
WBC: 8.4 10*3/uL (ref 4.0–10.5)

## 2016-12-31 LAB — GLUCOSE, CAPILLARY
GLUCOSE-CAPILLARY: 241 mg/dL — AB (ref 65–99)
Glucose-Capillary: 171 mg/dL — ABNORMAL HIGH (ref 65–99)
Glucose-Capillary: 130 mg/dL — ABNORMAL HIGH (ref 65–99)
Glucose-Capillary: 178 mg/dL — ABNORMAL HIGH (ref 65–99)

## 2016-12-31 MED ORDER — CARVEDILOL 12.5 MG PO TABS
12.5000 mg | ORAL_TABLET | Freq: Two times a day (BID) | ORAL | Status: DC
  Administered 2016-12-31 – 2017-01-03 (×6): 12.5 mg via ORAL
  Filled 2016-12-31 (×6): qty 1

## 2016-12-31 NOTE — Progress Notes (Signed)
Assessment: 1. CKD (uncertain baseline). Screat was 1.3 in Nov 2016. Sees Dr. Joesph July [phone] in [facility].  2. Vol excess - stable 3. Severe multi-vessel CAD - deemed not surgical/ PCI candidate in 2016 4. DM2 5. HTNive crisis - improved 6. ICM 45%, ? LA density by echo; unable to do TEE x 2 due to obstruction Will obtain barium swallow   Subjective: Interval History: Does report diff swallowing pills  Objective: Vital signs in last 24 hours: Temp:  [97.2 F (36.2 C)-98.3 F (36.8 C)] 97.3 F (36.3 C) (04/14 0757) Pulse Rate:  [79-88] 88 (04/14 0821) Resp:  [16-21] 19 (04/14 0500) BP: (100-171)/(58-87) 145/75 (04/14 0821) SpO2:  [94 %-100 %] 97 % (04/14 0757) Weight:  [73.4 kg (161 lb 14.4 oz)] 73.4 kg (161 lb 14.4 oz) (04/14 0500) Weight change: -0.227 kg (-8 oz)  Intake/Output from previous day: 04/13 0701 - 04/14 0700 In: -  Out: 1250 [Urine:1250] Intake/Output this shift: Total I/O In: 400 [P.O.:400] Out: 775 [Urine:775]  General appearance: alert and cooperative Resp: clear to auscultation bilaterally Cardio: regular rate and rhythm, S1, S2 normal, no murmur, click, rub or gallop Extremities: extremities normal, atraumatic, no cyanosis, tr edema  Lab Results:  Recent Labs  12/30/16 0408 12/31/16 0454  WBC 8.8 8.4  HGB 9.0* 8.9*  HCT 27.8* 29.2*  PLT 279 295   BMET:  Recent Labs  12/30/16 0408 12/31/16 0454  NA 138 139  K 4.3 4.6  CL 103 101  CO2 25 26  GLUCOSE 207* 231*  BUN 62* 56*  CREATININE 3.32* 3.34*  CALCIUM 8.5* 9.0   No results for input(s): PTH in the last 72 hours. Iron Studies: No results for input(s): IRON, TIBC, TRANSFERRIN, FERRITIN in the last 72 hours. Studies/Results: No results found.  Scheduled: . aspirin  81 mg Oral Daily  . atorvastatin  80 mg Oral q1800  . carvedilol  12.5 mg Oral BID WC  . furosemide  40 mg Oral Daily  . heparin subcutaneous  5,000 Units Subcutaneous Q8H  . hydrALAZINE  25 mg Oral Q8H  .  insulin aspart  0-15 Units Subcutaneous TID WC  . insulin aspart  0-5 Units Subcutaneous QHS  . insulin aspart  5 Units Subcutaneous TID AC  . insulin glargine  25 Units Subcutaneous BID  . isosorbide mononitrate  30 mg Oral Daily  . losartan  100 mg Oral Daily    LOS: 6 days   Latese Dufault C 12/31/2016,11:30 AM

## 2016-12-31 NOTE — Progress Notes (Signed)
0130 - First episode of chest pain 10/10, relieved with one tab of Nitro. SBP 170's  0250 - Second episode of chest pain 10/10, relieved with one tab of Nitro. EKG completed. SBP 160's. Cardiology paged. MD assessed EKG. Angina vs. Anxiety. OK'd RN to give Xanax early.   If chest pain persists, Cardiology to be repaged.

## 2016-12-31 NOTE — Progress Notes (Signed)
Progress Note  Patient Name: Jenna Brown Date of Encounter: 12/31/2016  Primary Cardiologist: Bensimhon  Subjective   Had angina at 3AM, relieved with one SL NTG. No angina pivoting to bedside commode today. BP much better. Net 7.8L diuresis, but weight changed much less (down 4 lb). Creat unchanged  Unable to pass TEE probe   Inpatient Medications    Scheduled Meds: . aspirin  81 mg Oral Daily  . atorvastatin  80 mg Oral q1800  . carvedilol  6.25 mg Oral BID WC  . furosemide  40 mg Oral Daily  . heparin subcutaneous  5,000 Units Subcutaneous Q8H  . hydrALAZINE  25 mg Oral Q8H  . insulin aspart  0-15 Units Subcutaneous TID WC  . insulin aspart  0-5 Units Subcutaneous QHS  . insulin aspart  5 Units Subcutaneous TID AC  . insulin glargine  25 Units Subcutaneous BID  . isosorbide mononitrate  30 mg Oral Daily  . losartan  100 mg Oral Daily   Continuous Infusions:  PRN Meds: acetaminophen, ALPRAZolam, nitroGLYCERIN, ondansetron (ZOFRAN) IV   Vital Signs    Vitals:   12/31/16 0500 12/31/16 0601 12/31/16 0757 12/31/16 0821  BP: 135/60 (!) 145/72 (!) 160/63 (!) 145/75  Pulse: 85 85 82 88  Resp: 19     Temp: 98.3 F (36.8 C)  97.3 F (36.3 C)   TempSrc: Oral  Oral   SpO2: 95%  97%   Weight: 73.4 kg (161 lb 14.4 oz)     Height:        Intake/Output Summary (Last 24 hours) at 12/31/16 1040 Last data filed at 12/31/16 0949  Gross per 24 hour  Intake              400 ml  Output             2025 ml  Net            -1625 ml   Filed Weights   12/29/16 0300 12/30/16 0530 12/31/16 0500  Weight: 75 kg (165 lb 4.8 oz) 73.7 kg (162 lb 6.4 oz) 73.4 kg (161 lb 14.4 oz)    Telemetry    NSR - Personally Reviewed  ECG    NSR, inferior Q waves, ST depression and TWI in I, aVL, V3-V6, unchanged from 12/25/16 and 08/12/2015 - Personally Reviewed  Physical Exam  Calm, comfortable GEN: No acute distress.   Neck: No JVD Cardiac: RRR, no murmurs, rubs, or gallops.    Respiratory: Clear to auscultation bilaterally. GI: Soft, nontender, non-distended  MS: No edema; S/P R AKA Neuro:  Nonfocal  Psych: Normal affect   Labs    Chemistry Recent Labs Lab 12/29/16 1349 12/30/16 0408 12/31/16 0454  NA 138 138 139  K 4.5 4.3 4.6  CL 104 103 101  CO2 25 25 26   GLUCOSE 192* 207* 231*  BUN 59* 62* 56*  CREATININE 3.41* 3.32* 3.34*  CALCIUM 8.5* 8.5* 9.0  GFRNONAA 13* 14* 14*  GFRAA 15* 16* 16*  ANIONGAP 9 10 12      Hematology Recent Labs Lab 12/29/16 0512 12/30/16 0408 12/31/16 0454  WBC 7.9 8.8 8.4  RBC 3.42* 3.34* 3.50*  HGB 8.8* 9.0* 8.9*  HCT 28.5* 27.8* 29.2*  MCV 83.3 83.2 83.4  MCH 25.7* 26.9 25.4*  MCHC 30.9 32.4 30.5  RDW 12.8 13.0 12.9  PLT 287 279 295    Cardiac Enzymes Recent Labs Lab 12/25/16 0449 12/25/16 0941 12/25/16 1508  TROPONINI 4.53* 4.62* 4.61*  Recent Labs Lab 12/25/16 0040  TROPIPOC 0.53*      Cardiac Studies   Echocardiogram 12/27/16:  Study Conclusions - Left ventricle: The cavity size was normal. Wall thickness was increased in a pattern of mild LVH. Systolic function was mildly reduced. The estimated ejection fraction was in the range of 45% to 50%.Diffuse hypokinesis. Doppler parameters are consistent with abnormal left ventricular relaxation (grade 1 diastolic dysfunction). Doppler parameters are consistent with high ventricular filling pressure. - Mitral valve: Calcified annulus.  Impressions: - Mild global reduction in LV systolic function; grade 1 diastolic dysfunction; left atrial density may be left atrial wall but cannot exclude mass; suggest TEE to further assess.   Patient Profile     66 y.o. female  With multivessel-CAD deemed to be a poor candidate for surgical or percutaneous revascularization (Prox LAD to Mid LAD lesion 90%, Ost Cx to Mid Cx lesion 95%, Ost 2nd Mrg to 2nd Mrg lesion 80%,Mid Cx lesion 90%, Prox RCA lesion 60%, Mid RCA lesion 97%),  systolic HF with improved LVEF (LVEF=20% in 2016, now 45-50%) IDDM, HTN, dyslipidemia, prior CVA, and s/p right AKA who presented with chest discomfort in the setting of SBP>262mmHg  Assessment & Plan    1.Hypertensive emergency:  - BP improved  - increase Coreg 12.5mg  BID. Continue nitrates hydralazine25mg  TID, and losartan 100 mg daily   2. NSTEMI - Would not pursue cath due to worsening renal function as well as the fact that patient has been deemed not to be a candidate for PCI or CABG.  - Continue medical management - needs aggressive Rx of HTN and also needs to be compliant with meds - continue home ASA  - continue BB and statin - DVT ppx heparin SQ - she had been off ticagrelor - consider restarting after TEE Monday  3.Dyslipidemia - LDL was 197 (noncompliance versus ineefective agent?) - Changed to Lipitor 80mg  daily - Will need repeat FLP and ALT in 6 weeks.   4.AKI;reduced GFR in the setting of HTN emergency  - stable creat, but worsened compared to 2.66 at [facility] in March 2018   5.CHF, EF improved to 45% - appears clinically euvolemic and echo does not show elevation in mean LA pressure - diuretic therapy per nephrology, currently on 80 mg lasix POBID - not a candidate for spironolactone due to renal disease.  6.IDDM glucose elevated at times.  - continue Insulin -TRH managing - SSI TIDAC and HS - A1C 8.2   7. Left atrial densitybut cannot exclude mass by recent echo.  - TEE pending for today under general anesthesia - TEE under conscious sedation unable to pass probe. - another attempt with anesthesia on Monday   Signed, Sanda Klein, MD  12/31/2016, 10:40 AM

## 2016-12-31 NOTE — Plan of Care (Signed)
Problem: Pain Managment: Goal: General experience of comfort will improve Outcome: Progressing 2 episodes of chest pain, resolved with 1 tab of Nitro each episode.

## 2017-01-01 LAB — BASIC METABOLIC PANEL
Anion gap: 8 (ref 5–15)
BUN: 58 mg/dL — AB (ref 6–20)
CO2: 26 mmol/L (ref 22–32)
Calcium: 8.8 mg/dL — ABNORMAL LOW (ref 8.9–10.3)
Chloride: 105 mmol/L (ref 101–111)
Creatinine, Ser: 3.25 mg/dL — ABNORMAL HIGH (ref 0.44–1.00)
GFR calc Af Amer: 16 mL/min — ABNORMAL LOW (ref >60)
GFR calc non Af Amer: 14 mL/min — ABNORMAL LOW (ref >60)
Glucose, Bld: 128 mg/dL — ABNORMAL HIGH (ref 65–99)
POTASSIUM: 4.5 mmol/L (ref 3.5–5.1)
SODIUM: 139 mmol/L (ref 135–145)

## 2017-01-01 LAB — CBC
HCT: 29.2 % — ABNORMAL LOW (ref 36.0–46.0)
HEMOGLOBIN: 9 g/dL — AB (ref 12.0–15.0)
MCH: 25.8 pg — AB (ref 26.0–34.0)
MCHC: 30.8 g/dL (ref 30.0–36.0)
MCV: 83.7 fL (ref 78.0–100.0)
Platelets: 288 10*3/uL (ref 150–400)
RBC: 3.49 MIL/uL — AB (ref 3.87–5.11)
RDW: 13 % (ref 11.5–15.5)
WBC: 8.3 10*3/uL (ref 4.0–10.5)

## 2017-01-01 LAB — GLUCOSE, CAPILLARY
GLUCOSE-CAPILLARY: 142 mg/dL — AB (ref 65–99)
Glucose-Capillary: 119 mg/dL — ABNORMAL HIGH (ref 65–99)
Glucose-Capillary: 204 mg/dL — ABNORMAL HIGH (ref 65–99)
Glucose-Capillary: 167 mg/dL — ABNORMAL HIGH (ref 65–99)

## 2017-01-01 MED ORDER — SODIUM CHLORIDE 0.9 % IV SOLN
INTRAVENOUS | Status: DC
  Administered 2017-01-02: 06:00:00 via INTRAVENOUS

## 2017-01-01 NOTE — Progress Notes (Signed)
Aessessment: 1. CKD (uncertain baseline). Screat was 1.3 in Nov 2016. Sees Dr. Joesph July [phone] in [facility].  2. Vol excess - stable 3. Severe multi-vessel CAD - deemed not surgical/ PCI candidate in 2016 4. DM2 5. HTNive crisis - improved 6. ICM 45%, ? LA density by echo; unable to do TEE x 2 (Barium swallow mild thickening of esophagus in cervical region)  Subjective: Interval History: Foer TEE under anesthesia Monday  Objective: Vital signs in last 24 hours: Temp:  [97.5 F (36.4 C)-98.7 F (37.1 C)] 98.7 F (37.1 C) (04/15 1400) Pulse Rate:  [77-81] 79 (04/15 1400) Resp:  [16-19] 18 (04/15 1400) BP: (123-147)/(46-64) 124/58 (04/15 1400) SpO2:  [98 %-99 %] 98 % (04/15 1400) Weight:  [73 kg (161 lb)] 73 kg (161 lb) (04/15 0625) Weight change: -0.408 kg (-14.4 oz)  Intake/Output from previous day: 04/14 0701 - 04/15 0700 In: 1102 [P.O.:1102] Out: 2425 [Urine:2425] Intake/Output this shift: Total I/O In: 480 [P.O.:480] Out: 375 [Urine:375]  General appearance: alert and cooperative Resp: clear to auscultation bilaterally Cardio: regular rate and rhythm, S1, S2 normal, no murmur, click, rub or gallop Extremitietr edemaedema tr edema  Lab Results:  Recent Labs  12/31/16 0454 01/01/17 0522  WBC 8.4 8.3  HGB 8.9* 9.0*  HCT 29.2* 29.2*  PLT 295 288   BMET:  Recent Labs  12/31/16 0454 01/01/17 0522  NA 139 139  K 4.6 4.5  CL 101 105  CO2 26 26  GLUCOSE 231* 128*  BUN 56* 58*  CREATININE 3.34* 3.25*  CALCIUM 9.0 8.8*   No results for input(s): PTH in the last 72 hours. Iron Studies: No results for input(s): IRON, TIBC, TRANSFERRIN, FERRITIN in the last 72 hours. Studies/Results: Dg Esophagus  Result Date: 12/31/2016 CLINICAL DATA:  66 year old female with inability to perform transesophageal echo due to apparent obstruction. EXAM: ESOPHOGRAM/BARIUM SWALLOW TECHNIQUE: Single contrast examination was performed using  thin barium. FLUOROSCOPY TIME:   Fluoroscopy Time:  3 minutes 48 seconds Number of Acquired Spot Images: 7. Total of 126 images which includes screen saved cine images COMPARISON:  None. FINDINGS: This study is somewhat limited due to patient's condition and inability to stand. There is equivocal mild wall thickening of the very proximal cervical esophagus. No other definite mass, filling defect or obstructing cause identified. Contrast flows easily into the stomach. Normal esophageal peristalsis noted. IMPRESSION: Equivocal mild wall thickening of the very proximal cervical esophagus. Consider direct inspection. No evidence of obstructing mass or process. Electronically Signed   By: Margarette Canada M.D.   On: 12/31/2016 16:08    Scheduled: . aspirin  81 mg Oral Daily  . atorvastatin  80 mg Oral q1800  . carvedilol  12.5 mg Oral BID WC  . furosemide  40 mg Oral Daily  . heparin subcutaneous  5,000 Units Subcutaneous Q8H  . hydrALAZINE  25 mg Oral Q8H  . insulin aspart  0-15 Units Subcutaneous TID WC  . insulin aspart  0-5 Units Subcutaneous QHS  . insulin aspart  5 Units Subcutaneous TID AC  . insulin glargine  25 Units Subcutaneous BID  . isosorbide mononitrate  30 mg Oral Daily  . losartan  100 mg Oral Daily     LOS: 7 days   Noya Santarelli C 01/01/2017,3:25 PM

## 2017-01-01 NOTE — Progress Notes (Signed)
Progress Note  Patient Name: Jenna Brown Date of Encounter: 01/01/2017  Primary Cardiologist: Bensimhon  Subjective   No angina last night. Almost 9 L net diuresis, less impressive weight change of 4.5 lbs. BP controlled, diastolic blood pressure sometimes low  Inpatient Medications    Scheduled Meds: . aspirin  81 mg Oral Daily  . atorvastatin  80 mg Oral q1800  . carvedilol  12.5 mg Oral BID WC  . furosemide  40 mg Oral Daily  . heparin subcutaneous  5,000 Units Subcutaneous Q8H  . hydrALAZINE  25 mg Oral Q8H  . insulin aspart  0-15 Units Subcutaneous TID WC  . insulin aspart  0-5 Units Subcutaneous QHS  . insulin aspart  5 Units Subcutaneous TID AC  . insulin glargine  25 Units Subcutaneous BID  . isosorbide mononitrate  30 mg Oral Daily  . losartan  100 mg Oral Daily   Continuous Infusions: . sodium chloride     PRN Meds: acetaminophen, ALPRAZolam, nitroGLYCERIN, ondansetron (ZOFRAN) IV   Vital Signs    Vitals:   12/31/16 2159 12/31/16 2229 01/01/17 0546 01/01/17 0625  BP: (!) 131/46 (!) 147/62 (!) 123/51   Pulse: 81   77  Resp: 16   19  Temp: 97.5 F (36.4 C)   98.6 F (37 C)  TempSrc: Oral   Oral  SpO2: 99%   98%  Weight:    73 kg (161 lb)  Height:        Intake/Output Summary (Last 24 hours) at 01/01/17 1134 Last data filed at 01/01/17 0925  Gross per 24 hour  Intake              922 ml  Output             1850 ml  Net             -928 ml   Filed Weights   12/30/16 0530 12/31/16 0500 01/01/17 0625  Weight: 73.7 kg (162 lb 6.4 oz) 73.4 kg (161 lb 14.4 oz) 73 kg (161 lb)    Telemetry    NSR - Personally Reviewed  ECG    NSR, inferior Q waves, ST depression and TWI in I, aVL, V3-V6, unchanged  - Personally Reviewed  Physical Exam  Comfortable GEN: No acute distress.   Neck: No JVD Cardiac: RRR, no murmurs, rubs, or gallops.  Respiratory: Clear to auscultation bilaterally. GI: Soft, nontender, non-distended  MS: No edema; No  deformity. R AKA Neuro:  Nonfocal  Psych: Normal affect   Labs    Chemistry Recent Labs Lab 12/30/16 0408 12/31/16 0454 01/01/17 0522  NA 138 139 139  K 4.3 4.6 4.5  CL 103 101 105  CO2 25 26 26   GLUCOSE 207* 231* 128*  BUN 62* 56* 58*  CREATININE 3.32* 3.34* 3.25*  CALCIUM 8.5* 9.0 8.8*  GFRNONAA 14* 14* 14*  GFRAA 16* 16* 16*  ANIONGAP 10 12 8      Hematology Recent Labs Lab 12/30/16 0408 12/31/16 0454 01/01/17 0522  WBC 8.8 8.4 8.3  RBC 3.34* 3.50* 3.49*  HGB 9.0* 8.9* 9.0*  HCT 27.8* 29.2* 29.2*  MCV 83.2 83.4 83.7  MCH 26.9 25.4* 25.8*  MCHC 32.4 30.5 30.8  RDW 13.0 12.9 13.0  PLT 279 295 288    Cardiac Enzymes Recent Labs Lab 12/25/16 1508  TROPONINI 4.61*   No results for input(s): TROPIPOC in the last 168 hours.   BNPNo results for input(s): BNP, PROBNP in the last  168 hours.   DDimer No results for input(s): DDIMER in the last 168 hours.   Radiology    Dg Esophagus  Result Date: 12/31/2016 CLINICAL DATA:  66 year old female with inability to perform transesophageal echo due to apparent obstruction. EXAM: ESOPHOGRAM/BARIUM SWALLOW TECHNIQUE: Single contrast examination was performed using  thin barium. FLUOROSCOPY TIME:  Fluoroscopy Time:  3 minutes 48 seconds Number of Acquired Spot Images: 7. Total of 126 images which includes screen saved cine images COMPARISON:  None. FINDINGS: This study is somewhat limited due to patient's condition and inability to stand. There is equivocal mild wall thickening of the very proximal cervical esophagus. No other definite mass, filling defect or obstructing cause identified. Contrast flows easily into the stomach. Normal esophageal peristalsis noted. IMPRESSION: Equivocal mild wall thickening of the very proximal cervical esophagus. Consider direct inspection. No evidence of obstructing mass or process. Electronically Signed   By: Margarette Canada M.D.   On: 12/31/2016 16:08    Cardiac Studies    Echocardiogram  12/27/16:  Study Conclusions - Left ventricle: The cavity size was normal. Wall thickness was increased in a pattern of mild LVH. Systolic function was mildly reduced. The estimated ejection fraction was in the range of 45% to 50%.Diffuse hypokinesis. Doppler parameters are consistent with abnormal left ventricular relaxation (grade 1 diastolic dysfunction). Doppler parameters are consistent with high ventricular filling pressure. - Mitral valve: Calcified annulus.  Impressions: - Mild global reduction in LV systolic function; grade 1 diastolic dysfunction; left atrial density may be left atrial wall but cannot exclude mass; suggest TEE to further assess.  Patient Profile     66 y.o. female with multivessel-CAD deemed to be a poor candidate for surgical or percutaneous revascularization,systolic HF with improved LVEF (LVEF 20% in 2016, now 45-50%) IDDM, HTN, dyslipidemia, prior CVA, and s/p right AKA who presented with small NSTEMI in the setting of SBP>251mmHg  (Prox LAD to Mid LAD lesion 90%, Ost Cx to Mid Cx lesion 95%, Ost 2nd Mrg to 2nd Mrg lesion 80%,Mid Cx lesion 90%, Prox RCA lesion 60%, Mid RCA lesion 50%)  Assessment & Plan    1.Hypertensive emergency:  - BP improved; Would not adjust medications further to avoid diastolic hypotension  2. NSTEMI - Poor candidate for revascularization with either PCI or surgery - Continue medical management: ASA, BB, statin. Restart ticagrelor after TEE Monday  3.Dyslipidemia - LDL was 197 (noncompliance versus ineefective agent?) - Changed to Lipitor 80mg  daily - Will need repeat FLP and ALT in 6 weeks.   4.AKI;reduced GFR in the setting of HTN emergency  - stable creat, but worsened compared to 2.66 at [facility] in March 2018  5.CHF, EF improved to 45% - appears clinically euvolemic and echo does not show elevation in mean LA pressure - diuretic therapy per nephrology - not a candidate for spironolactone  due to renal disease.  6.IDDM  - requiringInsulin   7. Left atrial densitybut cannot exclude mass by recent TTE.  - Unable to perform TEE last week, barium swallow shows equivocal swelling in the upper esophagus which may simply represent inflammation from trauma from last week. No obstructive lesion. - another attempt with anesthesia on Monday  Signed, Sanda Klein, MD  01/01/2017, 11:34 AM

## 2017-01-02 ENCOUNTER — Inpatient Hospital Stay (HOSPITAL_COMMUNITY): Payer: Medicare Other

## 2017-01-02 DIAGNOSIS — R070 Pain in throat: Secondary | ICD-10-CM

## 2017-01-02 DIAGNOSIS — R1312 Dysphagia, oropharyngeal phase: Secondary | ICD-10-CM

## 2017-01-02 LAB — CBC
HEMATOCRIT: 28.1 % — AB (ref 36.0–46.0)
Hemoglobin: 9 g/dL — ABNORMAL LOW (ref 12.0–15.0)
MCH: 26.7 pg (ref 26.0–34.0)
MCHC: 32 g/dL (ref 30.0–36.0)
MCV: 83.4 fL (ref 78.0–100.0)
PLATELETS: 274 10*3/uL (ref 150–400)
RBC: 3.37 MIL/uL — AB (ref 3.87–5.11)
RDW: 13.1 % (ref 11.5–15.5)
WBC: 8.4 10*3/uL (ref 4.0–10.5)

## 2017-01-02 LAB — GLUCOSE, CAPILLARY
GLUCOSE-CAPILLARY: 130 mg/dL — AB (ref 65–99)
GLUCOSE-CAPILLARY: 245 mg/dL — AB (ref 65–99)
GLUCOSE-CAPILLARY: 174 mg/dL — AB (ref 65–99)
Glucose-Capillary: 151 mg/dL — ABNORMAL HIGH (ref 65–99)

## 2017-01-02 LAB — BASIC METABOLIC PANEL
ANION GAP: 7 (ref 5–15)
BUN: 58 mg/dL — ABNORMAL HIGH (ref 6–20)
CALCIUM: 8.7 mg/dL — AB (ref 8.9–10.3)
CO2: 25 mmol/L (ref 22–32)
Chloride: 104 mmol/L (ref 101–111)
Creatinine, Ser: 3.28 mg/dL — ABNORMAL HIGH (ref 0.44–1.00)
GFR, EST AFRICAN AMERICAN: 16 mL/min — AB (ref >60)
GFR, EST NON AFRICAN AMERICAN: 14 mL/min — AB (ref >60)
GLUCOSE: 207 mg/dL — AB (ref 65–99)
POTASSIUM: 4.7 mmol/L (ref 3.5–5.1)
Sodium: 136 mmol/L (ref 135–145)

## 2017-01-02 NOTE — Progress Notes (Signed)
Progress Note  Patient Name: Jenna Brown Date of Encounter: 01/02/2017  Primary Cardiologist: Dr. Haroldine Laws  Subjective   Denies any chest pain or SOB lat night. Down a total of 10 mL this admission, weight up 3 lbs BP currently well controlled.  Pending Transesophageal Echo today  Inpatient Medications    Scheduled Meds: . aspirin  81 mg Oral Daily  . atorvastatin  80 mg Oral q1800  . carvedilol  12.5 mg Oral BID WC  . furosemide  40 mg Oral Daily  . heparin subcutaneous  5,000 Units Subcutaneous Q8H  . hydrALAZINE  25 mg Oral Q8H  . insulin aspart  0-15 Units Subcutaneous TID WC  . insulin aspart  0-5 Units Subcutaneous QHS  . insulin aspart  5 Units Subcutaneous TID AC  . insulin glargine  25 Units Subcutaneous BID  . isosorbide mononitrate  30 mg Oral Daily  . losartan  100 mg Oral Daily   Continuous Infusions: . sodium chloride 20 mL/hr at 01/02/17 0609   PRN Meds: acetaminophen, ALPRAZolam, nitroGLYCERIN, ondansetron (ZOFRAN) IV   Vital Signs    Vitals:   01/01/17 1400 01/01/17 2026 01/02/17 0533 01/02/17 0929  BP: (!) 124/58 (!) 133/59 (!) 147/50 (!) 151/65  Pulse: 79 73  82  Resp: 18 (!) 21    Temp: 98.7 F (37.1 C) 98.5 F (36.9 C) 98 F (36.7 C)   TempSrc: Oral Oral Oral   SpO2: 98% 98% 97%   Weight:   161 lb 12.8 oz (73.4 kg)   Height:        Intake/Output Summary (Last 24 hours) at 01/02/17 1048 Last data filed at 01/02/17 0944  Gross per 24 hour  Intake              480 ml  Output             1800 ml  Net            -1320 ml   Filed Weights   12/31/16 0500 01/01/17 0625 01/02/17 0533  Weight: 161 lb 14.4 oz (73.4 kg) 161 lb (73 kg) 161 lb 12.8 oz (73.4 kg)    Telemetry    NSR - Personally Reviewed  ECG      NSR, inferior Q waves, ST depression and TWI in I, aVL, V3-V6, unchanged  (12/31/16) - Personally Reviewed  Physical Exam   Comfortable GEN:No acute distress.   Neck:No JVD Cardiac:RRR, no murmurs, rubs, or gallops.   Respiratory:Clear to auscultation bilaterally. LP:FXTK, nontender, non-distended  MS:No edema; No deformity. R AKA Neuro:Nonfocal  Psych: Normal affect   Labs    Chemistry Recent Labs Lab 12/31/16 0454 01/01/17 0522 01/02/17 0357  NA 139 139 136  K 4.6 4.5 4.7  CL 101 105 104  CO2 26 26 25   GLUCOSE 231* 128* 207*  BUN 56* 58* 58*  CREATININE 3.34* 3.25* 3.28*  CALCIUM 9.0 8.8* 8.7*  GFRNONAA 14* 14* 14*  GFRAA 16* 16* 16*  ANIONGAP 12 8 7      Hematology Recent Labs Lab 12/31/16 0454 01/01/17 0522 01/02/17 0357  WBC 8.4 8.3 8.4  RBC 3.50* 3.49* 3.37*  HGB 8.9* 9.0* 9.0*  HCT 29.2* 29.2* 28.1*  MCV 83.4 83.7 83.4  MCH 25.4* 25.8* 26.7  MCHC 30.5 30.8 32.0  RDW 12.9 13.0 13.1  PLT 295 288 274      Radiology      Dg Esophagus  Result Date: 12/31/2016 CLINICAL DATA:  66 year old female with inability  to perform transesophageal echo due to apparent obstruction. EXAM: ESOPHOGRAM/BARIUM SWALLOW TECHNIQUE: Single contrast examination was performed using  thin barium. FLUOROSCOPY TIME:  Fluoroscopy Time:  3 minutes 48 seconds Number of Acquired Spot Images: 7. Total of 126 images which includes screen saved cine images COMPARISON:  None. FINDINGS: This study is somewhat limited due to patient's condition and inability to stand. There is equivocal mild wall thickening of the very proximal cervical esophagus. No other definite mass, filling defect or obstructing cause identified. Contrast flows easily into the stomach. Normal esophageal peristalsis noted. IMPRESSION: Equivocal mild wall thickening of the very proximal cervical esophagus. Consider direct inspection. No evidence of obstructing mass or process. Electronically Signed   By: Margarette Canada M.D.   On: 12/31/2016 16:08    Cardiac Studies   Pending Transesophageal Echo    Transthoracic Echocardiography 12/27/2016  Study Conclusions  - Left ventricle: The cavity size was normal. Wall thickness was    increased in a pattern of mild LVH. Systolic function was mildly   reduced. The estimated ejection fraction was in the range of 45%   to 50%. Diffuse hypokinesis. Doppler parameters are consistent   with abnormal left ventricular relaxation (grade 1 diastolic   dysfunction). Doppler parameters are consistent with high   ventricular filling pressure. - Mitral valve: Calcified annulus.  Impressions:  - Mild global reduction in LV systolic function; grade 1 diastolic   dysfunction; left atrial density may be left atrial wall but   cannot exclude mass; suggest TEE to further assess.  Patient Profile        66 y.o. female with multivessel-CAD deemed to be a poor candidate for surgical or percutaneous revascularization,systolic HF with improved LVEF(LVEF 20% in 2016, now 45-50%)IDDM, HTN, dyslipidemia, prior CVA, and s/p right AKA who presented with small NSTEMI in the setting of SBP>253mmHg  (Prox LAD to Mid LAD lesion 90%, Ost Cx to Mid Cx lesion 95%, Ost 2nd Mrg to 2nd Mrg lesion 80%,Mid Cx lesion 90%, Prox RCA lesion 60%, Mid RCA lesion 50%)  Assessment & Plan    1.Hypertensive emergency:  - BP improved; stable  2. NSTEMI - Poor candidate for revascularization with either PCI or surgery - Continue medical management: ASA, BB, statin. Restart ticagrelor after TEE Monday  3.Dyslipidemia - LDL was 197 (noncompliance versus ineffective agent?) - Changed to Lipitor 80mg  daily - Will need repeat FLP and ALT in 6 weeks.   4.AKI;reduced GFR in the setting of HTN emergency  - stable creat, but worsened compared to2.66 at [facility] in March 2018 3.34 --> 3.25 --> 3.28   5.CHF, EF improved to 45% - appears clinically euvolemic and echo does not show elevation in mean LA pressure - diuretic therapy per nephrology - not a candidate for spironolactone due to renal disease.  6.IDDM  - requiringInsulin   7. Left atrial densitybut cannot exclude mass by recent TTE.   - Unable to perform TEE last week, barium swallow shows equivocal swelling in the upper esophagus which may simply represent inflammation from trauma from last week. No obstructive lesion. - another attempt with anesthesia on Monday--- plan for TEE today.   Plan: To discuss with MD, Restart Ticagrelor after TEE today   Signed, Linus Mako, PA-C  01/02/2017, 10:48 AM    I have seen and examined the patient along with GREENE,TIFFANY G, PA-C.  I have reviewed the chart, notes and new data.  I agree with PA/NP's note.  Key new complaints: no dysphagia. Dr. Radford Pax  requested ENT evaluation before proceeding with TEE. Dr. Benjamine Mola will see in consultation today. Key examination changes: comfortable lying flat Key new findings / data: creat 3.28  PLAN: Weight, renal function and symptoms all stable on current diuretic dose. I doubt that there is an obstructive pharyngeal abnormality, but will wait for ENT evaluation before TEE with deeper sedation.  Addendum: patient reports that she has had cleft palate repair - I wonder if the unusual arched anatomy may be interfering with probe passage. Reportedly, scope performed by ENT did not show obstruction (but report not yet available).  Sanda Klein, MD, [place] [phone] 01/02/2017, 1:11 PM

## 2017-01-02 NOTE — Care Management Important Message (Signed)
Important Message  Patient Details  Name: Jenna Brown MRN: [id number] Date of Birth: 1950/11/24   Medicare Important Message Given:  Yes    Nathen May 01/02/2017, 2:43 PM

## 2017-01-02 NOTE — Progress Notes (Signed)
[facility] Progress Note    Assessment/ Plan:   1. CKD; baseline creatinine appears to be 2.6 in 3/ 2018. Screat was 1.3 in Nov 2016. Sees Dr. Joesph [phone] in [facility]. Cr may improve as there may be some acute kidney injury from her hypertensive emergency on presentation.   2. Vol excess - stable 3. Severe multi-vessel CAD - deemed not surgical/ PCI candidate in 2016 4. DM2 5. HTNive crisis - improved 6. ICM 45%, ? LA density by echo; unable to do TEE x 2(Barium swallow mild thickening of esophagus in cervical region).  For TEE under GA today. 7. R carotid bruit- Carotid dopplers showed R 1-39% and L 40-59% stenosis 11/2016.    Subjective:    Feels well except for a dry throat.  For TEE today   Objective:   BP (!) 147/50   Pulse 73   Temp 98 F (36.7 C) (Oral)   Resp (!) 21   Ht 5\' 4"  (1.626 m)   Wt 73.4 kg (161 lb 12.8 oz)   SpO2 97%   BMI 27.77 kg/m   Intake/Output Summary (Last 24 hours) at 01/02/17 0854 Last data filed at 01/02/17 0700  Gross per 24 hour  Intake              720 ml  Output             1500 ml  Net             -780 ml   Weight change: 0.363 kg (12.8 oz)  Physical Exam: [id number], cooperative Neck: R sided carotid bruit CVS:RRR Resp: clear bilaterally VZS:MOLMB Ext:no LE edema  Imaging: Dg Esophagus  Result Date: 12/31/2016 CLINICAL DATA:  66 year old female with inability to perform transesophageal echo due to apparent obstruction. EXAM: ESOPHOGRAM/BARIUM SWALLOW TECHNIQUE: Single contrast examination was performed using  thin barium. FLUOROSCOPY TIME:  Fluoroscopy Time:  3 minutes 48 seconds Number of Acquired Spot Images: 7. Total of 126 images which includes screen saved cine images COMPARISON:  None. FINDINGS: This study is somewhat limited due to patient's condition and inability to stand. There is equivocal mild wall thickening of the very proximal cervical esophagus. No other definite mass, filling defect or  obstructing cause identified. Contrast flows easily into the stomach. Normal esophageal peristalsis noted. IMPRESSION: Equivocal mild wall thickening of the very proximal cervical esophagus. Consider direct inspection. No evidence of obstructing mass or process. Electronically Signed   By: Margarette Canada M.D.   On: 12/31/2016 16:08    Labs: BMET  Recent Labs Lab 12/28/16 1222 12/29/16 1151 12/29/16 1349 12/30/16 0408 12/31/16 0454 01/01/17 0522 01/02/17 0357  NA 138 139 138 138 139 139 136  K 4.2 4.4 4.5 4.3 4.6 4.5 4.7  CL 100* 105 104 103 101 105 104  CO2 28 25 25 25 26 26 25   GLUCOSE 105* 88 192* 207* 231* 128* 207*  BUN 62* 59* 59* 62* 56* 58* 58*  CREATININE 3.65* 3.28* 3.41* 3.32* 3.34* 3.25* 3.28*  CALCIUM 8.7* 8.6* 8.5* 8.5* 9.0 8.8* 8.7*   CBC  Recent Labs Lab 12/30/16 0408 12/31/16 0454 01/01/17 0522 01/02/17 0357  WBC 8.8 8.4 8.3 8.4  HGB 9.0* 8.9* 9.0* 9.0*  HCT 27.8* 29.2* 29.2* 28.1*  MCV 83.2 83.4 83.7 83.4  PLT 279 295 288 274    Medications:    . aspirin  81 mg Oral Daily  . atorvastatin  80 mg Oral q1800  . carvedilol  12.5 mg  Oral BID WC  . furosemide  40 mg Oral Daily  . heparin subcutaneous  5,000 Units Subcutaneous Q8H  . hydrALAZINE  25 mg Oral Q8H  . insulin aspart  0-15 Units Subcutaneous TID WC  . insulin aspart  0-5 Units Subcutaneous QHS  . insulin aspart  5 Units Subcutaneous TID AC  . insulin glargine  25 Units Subcutaneous BID  . isosorbide mononitrate  30 mg Oral Daily  . losartan  100 mg Oral Daily      Madelon Lips, MD 01/02/2017, 8:54 AM

## 2017-01-02 NOTE — Consult Note (Signed)
Reason for Consult: Eval for pharyngeal obstruction  HPI:  Jenna Brown is an 66 y.o. female who was recently admitted for evaluation of her chest discomfort. She has a history of multi-vessel coronary artery disease with ejection fraction of 20%. She also has a h/o IDDM, HTN, dyslipidemia, prior CVA, and s/p right AKA. The patient underwent an attempted trans-esophageal echocardiogram last Thursday. However Dr. Debara Pickett was not able to pass the probe into the esophagus. ENT was therefore consulted to evaluate her pharynx for possible abnormality. The patient also underwent a barium swallow study on Saturday. The study showed no evidence of obstructing mass or lesion. However there was an equivocal finding of mild wall thickening of the proximal cervical esophagus. The patient currently denies any dysphagia or odynophagia.  The patient has a history of congenital cleft palate.  Past Medical History:  Diagnosis Date  . Anxiety    takes Xanax prn  . Arthritis   . GERD (gastroesophageal reflux disease)   . H/O hiatal hernia   . Headache(784.0)    when b/p is elevated  . Hemorrhoids   . Hyperlipidemia    takes Simvastatin daily  . Hypertension    takes Amlodipine/HCTZ/Losartan daily  . Migraines   . Myocardial infarction ([place]) 1990's  . Peripheral vascular disease ([place])   . PONV (postoperative nausea and vomiting)   . Stroke [facility])    TIA history  . Type II diabetes mellitus (HCC)    takes Glucovance and Januvia daily  . Vertigo    takes Ativert prn    Past Surgical History:  Procedure Laterality Date  . ABDOMINAL AORTAGRAM N/A 02/29/2012   Procedure: ABDOMINAL Maxcine Ham;  Surgeon: [name], MD;  Location: [facility] CATH LAB;  Service: Cardiovascular;  Laterality: N/A;  . AMPUTATION  05/10/2012   Procedure: AMPUTATION ABOVE KNEE;  Surgeon: [name], MD;  Location: [place] OR;  Service: Vascular;  Laterality: Right;   open right groin wound noted  . aortogram  02/2012  . CARDIAC  CATHETERIZATION N/A 04/13/2015   Procedure: Right/Left Heart Cath and Coronary Angiography;  Surgeon: [name], MD;  Location: [place] CV LAB;  Service: Cardiovascular;  Laterality: N/A;  . CLEFT PALATE REPAIR     several  . COLONOSCOPY    . DILATION AND CURETTAGE OF UTERUS    . FEMORAL-POPLITEAL BYPASS GRAFT  04/05/2012   Procedure: BYPASS GRAFT FEMORAL-POPLITEAL ARTERY;  Surgeon: [name], MD;  Location: [place];  Service: Vascular;  Laterality: Right;  REVISION  . FEMORAL-TIBIAL BYPASS GRAFT  03/29/2012   Procedure: BYPASS GRAFT FEMORAL-TIBIAL ARTERY;  Surgeon: [name], MD;  Location: [place] OR;  Service: Vascular;  Laterality: Right;  Right femoral to Posterior Tibialis with composite graft of 43m x 80 cm ringed gortex graft and saphenous vein  ,intraoperative arteriogram  . FEMOROPOPLITEAL THROMBECTOMY / EMBOLECTOMY  05/09/2012  . MYOMECTOMY    . PR VEIN BYPASS GRAFT,AORTO-FEM-POP  05/27/11   Right SFA-Below knee Pop BP  . TONSILLECTOMY    . TUBAL LIGATION  ~ 1990    Family History  Problem Relation Age of Onset  . Cancer Mother     breast  . Diabetes Father     Social History:  reports that she quit smoking about 4 years ago. Her smoking use included Cigarettes. She has a 10.00 pack-year smoking history. She has never used smokeless tobacco. She reports that she does not drink alcohol or use drugs.  Allergies:  Allergies  Allergen  Reactions  . Plavix [Clopidogrel Bisulfate] Itching  . Codeine Other (See Comments)    "makes me feel strange"  . Tylenol With Codeine #3 [Acetaminophen-Codeine] Other (See Comments)    "doesn't make me feel right"  . Tylenol [Acetaminophen] Other (See Comments)    "Doesn't feel right"     Prior to Admission medications   Medication Sig Start Date End Date Taking? Authorizing Provider  acetaminophen (TYLENOL) 650 MG CR tablet Take 650 mg by mouth at bedtime.    Yes Historical Provider, MD  ALPRAZolam (XANAX) 0.25 MG tablet Take  0.25 mg by mouth 3 (three) times daily as needed for anxiety.    Yes Historical Provider, MD  aspirin 81 MG chewable tablet Chew 81 mg by mouth daily.   Yes Historical Provider, MD  digoxin (LANOXIN) 0.125 MG tablet Take 1 tablet (0.125 mg total) by mouth daily. 05/14/15  Yes Amy D Clegg, NP  furosemide (LASIX) 40 MG tablet Take 1 tablet (40 mg total) by mouth daily. 05/14/15  Yes Amy D Clegg, NP  insulin glargine (LANTUS) 100 UNIT/ML injection Inject 25 Units into the skin 2 (two) times daily.    Yes Historical Provider, MD  insulin lispro (HUMALOG) 100 UNIT/ML injection Inject 5 Units into the skin 3 (three) times daily before meals.   Yes Historical Provider, MD  Insulin Pen Needle (AURORA PEN NEEDLES) 29G X 12MM MISC 5 Units by Does not apply route 3 (three) times daily before meals. 05/16/15  Yes Ricard Dillon, MD  isosorbide mononitrate (IMDUR) 30 MG 24 hr tablet take 1 tablet by mouth once daily 04/18/16  Yes Jolaine Artist, MD  losartan (COZAAR) 100 MG tablet Take 100 mg by mouth daily.   Yes Historical Provider, MD  metoprolol succinate (TOPROL-XL) 25 MG 24 hr tablet Take 25 mg by mouth daily.   Yes Historical Provider, MD  nitroGLYCERIN (NITROSTAT) 0.4 MG SL tablet Place 1 tablet (0.4 mg total) under the tongue every 5 (five) minutes x 3 doses as needed for chest pain. 08/13/15  Yes Rogelia Mire, NP  pravastatin (PRAVACHOL) 40 MG tablet Take 40 mg by mouth daily.   Yes Historical Provider, MD    Medications:  I have reviewed the patient's current medications. Scheduled: . aspirin  81 mg Oral Daily  . atorvastatin  80 mg Oral q1800  . carvedilol  12.5 mg Oral BID WC  . furosemide  40 mg Oral Daily  . heparin subcutaneous  5,000 Units Subcutaneous Q8H  . hydrALAZINE  25 mg Oral Q8H  . insulin aspart  0-15 Units Subcutaneous TID WC  . insulin aspart  0-5 Units Subcutaneous QHS  . insulin aspart  5 Units Subcutaneous TID AC  . insulin glargine  25 Units Subcutaneous BID  .  isosorbide mononitrate  30 mg Oral Daily  . losartan  100 mg Oral Daily   DGL:OVFIEPPIRJJOA, ALPRAZolam, nitroGLYCERIN, ondansetron (ZOFRAN) IV  Results for orders placed or performed during the hospital encounter of 12/25/16 (from the past 48 hour(s))  Glucose, capillary     Status: Abnormal   Collection Time: 12/31/16  9:59 PM  Result Value Ref Range   Glucose-Capillary 178 (H) 65 - 99 mg/dL  CBC     Status: Abnormal   Collection Time: 01/01/17  5:22 AM  Result Value Ref Range   WBC 8.3 4.0 - 10.5 K/uL   RBC 3.49 (L) 3.87 - 5.11 MIL/uL   Hemoglobin 9.0 (L) 12.0 - 15.0 g/dL   HCT  29.2 (L) 36.0 - 46.0 %   MCV 83.7 78.0 - 100.0 fL   MCH 25.8 (L) 26.0 - 34.0 pg   MCHC 30.8 30.0 - 36.0 g/dL   RDW 13.0 11.5 - 15.5 %   Platelets 288 150 - 400 K/uL  Basic metabolic panel     Status: Abnormal   Collection Time: 01/01/17  5:22 AM  Result Value Ref Range   Sodium 139 135 - 145 mmol/L   Potassium 4.5 3.5 - 5.1 mmol/L   Chloride 105 101 - 111 mmol/L   CO2 26 22 - 32 mmol/L   Glucose, Bld 128 (H) 65 - 99 mg/dL   BUN 58 (H) 6 - 20 mg/dL   Creatinine, Ser 3.25 (H) 0.44 - 1.00 mg/dL   Calcium 8.8 (L) 8.9 - 10.3 mg/dL   GFR calc non Af Amer 14 (L) >60 mL/min   GFR calc Af Amer 16 (L) >60 mL/min    Comment: (NOTE) The eGFR has been calculated using the CKD EPI equation. This calculation has not been validated in all clinical situations. eGFR's persistently <60 mL/min signify possible Chronic Kidney Disease.    Anion gap 8 5 - 15  Glucose, capillary     Status: Abnormal   Collection Time: 01/01/17  7:36 AM  Result Value Ref Range   Glucose-Capillary 119 (H) 65 - 99 mg/dL  Glucose, capillary     Status: Abnormal   Collection Time: 01/01/17 11:43 AM  Result Value Ref Range   Glucose-Capillary 204 (H) 65 - 99 mg/dL  Glucose, capillary     Status: Abnormal   Collection Time: 01/01/17  4:01 PM  Result Value Ref Range   Glucose-Capillary 142 (H) 65 - 99 mg/dL  Glucose, capillary      Status: Abnormal   Collection Time: 01/01/17  9:15 PM  Result Value Ref Range   Glucose-Capillary 167 (H) 65 - 99 mg/dL  CBC     Status: Abnormal   Collection Time: 01/02/17  3:57 AM  Result Value Ref Range   WBC 8.4 4.0 - 10.5 K/uL   RBC 3.37 (L) 3.87 - 5.11 MIL/uL   Hemoglobin 9.0 (L) 12.0 - 15.0 g/dL   HCT 28.1 (L) 36.0 - 46.0 %   MCV 83.4 78.0 - 100.0 fL   MCH 26.7 26.0 - 34.0 pg   MCHC 32.0 30.0 - 36.0 g/dL   RDW 13.1 11.5 - 15.5 %   Platelets 274 150 - 400 K/uL  Basic metabolic panel     Status: Abnormal   Collection Time: 01/02/17  3:57 AM  Result Value Ref Range   Sodium 136 135 - 145 mmol/L   Potassium 4.7 3.5 - 5.1 mmol/L   Chloride 104 101 - 111 mmol/L   CO2 25 22 - 32 mmol/L   Glucose, Bld 207 (H) 65 - 99 mg/dL   BUN 58 (H) 6 - 20 mg/dL   Creatinine, Ser 3.28 (H) 0.44 - 1.00 mg/dL   Calcium 8.7 (L) 8.9 - 10.3 mg/dL   GFR calc non Af Amer 14 (L) >60 mL/min   GFR calc Af Amer 16 (L) >60 mL/min    Comment: (NOTE) The eGFR has been calculated using the CKD EPI equation. This calculation has not been validated in all clinical situations. eGFR's persistently <60 mL/min signify possible Chronic Kidney Disease.    Anion gap 7 5 - 15  Glucose, capillary     Status: Abnormal   Collection Time: 01/02/17  7:23 AM  Result  Value Ref Range   Glucose-Capillary 151 (H) 65 - 99 mg/dL  Glucose, capillary     Status: Abnormal   Collection Time: 01/02/17 11:05 AM  Result Value Ref Range   Glucose-Capillary 130 (H) 65 - 99 mg/dL  Glucose, capillary     Status: Abnormal   Collection Time: 01/02/17  4:44 PM  Result Value Ref Range   Glucose-Capillary 245 (H) 65 - 99 mg/dL   Review of Systems:  No Fever-chills, myalgias or other constitutional symptoms No Headache, changes with Vision or hearing, new weakness, tingling, numbness in any extremity, dizziness, dysarthria or word finding difficulty, gait disturbance or imbalance, tremors or seizure activity No problems swallowing  food or Liquids, indigestion/reflux, choking or coughing while eating, abdominal pain with or after eating. No Cough or Shortness of Breath, palpitations No Abdominal pain, N/V, melena,hematochezia, dark tarry stools, constipation No dysuria, malodorous urine, hematuria or flank pain No new skin rashes, lesions, masses or bruises, No new joint pains, aches, swelling or redness No recent unintentional weight gain or loss No polyuria, polydypsia or polyphagia  Blood pressure (!) 118/51, pulse 79, temperature 97 F (36.1 C), temperature source Axillary, resp. rate 19, height 5' 4" (1.626 m), weight 161 lb 12.8 oz (73.4 kg), SpO2 100 %. Physical Exam: Constitutional: NAD, calm, comfortable Eyes: PERRL, lids and conjunctivae normal Ears: Normal auricles and external auditory canals. Nose: Mildly congested nasal mucosa. The septum and turbinates are normal. Mouth: Normal mucosa, lips, tongue, and oral pharynx. The patient has an unrepaired cleft palate. Neck: normal, supple, no masses, no thyromegaly Respiratory: clear to auscultation bilaterally. Normal respiratory effort. No accessory muscle use.  Abdomen: no tenderness, no masses palpated.  Musculoskeletal: no clubbing / cyanosis. No joint deformity upper and lower extremities. Good ROM, no contractures. Normal muscle tone. Bilateral AKA. Skin: no rashes, lesions, ulcers. No induration Neurologic: CN 2-12 grossly intact. Sensation intact. Psychiatric: Normal judgment and insight. Alert and oriented x 3. Normal mood.   Procedure:  Flexible Fiberoptic Laryngoscopy Anesthesia: Topical oxymetazoline and lidocaine Indication: Evaluate for possible pharyngeal obstruction/abnormality Description: Risks, benefits, and alternatives of flexible endoscopy were explained to the patient. Specific mention was made of the risk of throat numbness with difficulty swallowing, possible bleeding from the nose and mouth, and pain from the procedure.  The patient  gave oral consent to proceed.  The nasal cavities were decongested and anesthetised with a combination of oxymetazoline and 4% lidocaine solution.  The flexible scope was inserted into the right nasal cavity and advanced towards the nasopharynx.  Visualized mucosa over the turbinates and septum were normal.  The nasopharynx was clear.  Oropharyngeal walls were symmetric and mobile without lesion, mass, or edema.  Hypopharynx was also without  lesion or edema.  Larynx was mobile without lesions.  No lesions or asymmetry in the supraglottic larynx.  Arytenoid mucosa was mildly edematous.  Posterior commissure was normal.  True vocal folds were pale yellow and without mass or lesion.    Assessment/Plan: The patient has a normal fiberoptic laryngoscopy examination. No suspicious mass or obstructing lesion is noted within the pharynx, larynx, or the esophageal inlet. No ENT intervention is recommended at this time.   W  01/02/2017, 6:00 PM

## 2017-01-03 ENCOUNTER — Encounter (HOSPITAL_COMMUNITY): Payer: Self-pay | Admitting: *Deleted

## 2017-01-03 ENCOUNTER — Inpatient Hospital Stay (HOSPITAL_COMMUNITY): Payer: Medicare Other | Admitting: Certified Registered Nurse Anesthetist

## 2017-01-03 ENCOUNTER — Inpatient Hospital Stay (HOSPITAL_COMMUNITY): Payer: Medicare Other

## 2017-01-03 ENCOUNTER — Encounter (HOSPITAL_COMMUNITY)
Admission: EM | Disposition: A | Discharge status: Home / Self Care | Payer: Self-pay | Source: Home / Self Care | Attending: Cardiology

## 2017-01-03 DIAGNOSIS — I5023 Acute on chronic systolic (congestive) heart failure: Secondary | ICD-10-CM

## 2017-01-03 HISTORY — PX: TEE WITHOUT CARDIOVERSION: SHX5443

## 2017-01-03 LAB — CBC
HEMATOCRIT: 27.2 % — AB (ref 36.0–46.0)
HEMOGLOBIN: 8.4 g/dL — AB (ref 12.0–15.0)
MCH: 25.8 pg — ABNORMAL LOW (ref 26.0–34.0)
MCHC: 30.9 g/dL (ref 30.0–36.0)
MCV: 83.7 fL (ref 78.0–100.0)
Platelets: 301 10*3/uL (ref 150–400)
RBC: 3.25 MIL/uL — ABNORMAL LOW (ref 3.87–5.11)
RDW: 12.9 % (ref 11.5–15.5)
WBC: 9.4 10*3/uL (ref 4.0–10.5)

## 2017-01-03 LAB — GLUCOSE, CAPILLARY
GLUCOSE-CAPILLARY: 142 mg/dL — AB (ref 65–99)
GLUCOSE-CAPILLARY: 241 mg/dL — AB (ref 65–99)
Glucose-Capillary: 145 mg/dL — ABNORMAL HIGH (ref 65–99)

## 2017-01-03 SURGERY — ECHOCARDIOGRAM, TRANSESOPHAGEAL
Anesthesia: Monitor Anesthesia Care

## 2017-01-03 MED ORDER — ATORVASTATIN CALCIUM 80 MG PO TABS: 80.0000 mg | ORAL_TABLET | Freq: Every day | ORAL | 6 refills | Status: DC

## 2017-01-03 MED ORDER — ALPRAZOLAM 0.25 MG PO TABS: 0.2500 mg | ORAL_TABLET | Freq: Three times a day (TID) | ORAL | 0 refills | Status: DC | PRN

## 2017-01-03 MED ORDER — TICAGRELOR 90 MG PO TABS: 90.0000 mg | ORAL_TABLET | Freq: Two times a day (BID) | ORAL | 11 refills | Status: DC

## 2017-01-03 MED ORDER — LIDOCAINE 2% (20 MG/ML) 5 ML SYRINGE
INTRAMUSCULAR | Status: DC | PRN
  Administered 2017-01-03: 40 mg via INTRAVENOUS
  Administered 2017-01-03: 60 mg via INTRAVENOUS

## 2017-01-03 MED ORDER — PROPOFOL 500 MG/50ML IV EMUL
INTRAVENOUS | Status: DC | PRN
  Administered 2017-01-03: 20 ug/kg/min via INTRAVENOUS

## 2017-01-03 MED ORDER — BUTAMBEN-TETRACAINE-BENZOCAINE 2-2-14 % EX AERO
INHALATION_SPRAY | CUTANEOUS | Status: DC | PRN
  Administered 2017-01-03: 2 via TOPICAL

## 2017-01-03 MED ORDER — PROPOFOL 10 MG/ML IV BOLUS
INTRAVENOUS | Status: DC | PRN
  Administered 2017-01-03: 30 mg via INTRAVENOUS
  Administered 2017-01-03 (×2): 20 mg via INTRAVENOUS

## 2017-01-03 MED ORDER — INSULIN GLARGINE 100 UNIT/ML ~~LOC~~ SOLN: 25.0000 [IU] | Freq: Two times a day (BID) | SUBCUTANEOUS | 11 refills | Status: DC

## 2017-01-03 MED ORDER — HYDRALAZINE HCL 25 MG PO TABS: 25.0000 mg | ORAL_TABLET | Freq: Three times a day (TID) | ORAL | 2 refills | Status: DC

## 2017-01-03 MED ORDER — INSULIN ASPART 100 UNIT/ML ~~LOC~~ SOLN: 5.0000 [IU] | Freq: Three times a day (TID) | SUBCUTANEOUS | 11 refills | Status: DC

## 2017-01-03 MED ORDER — CARVEDILOL 12.5 MG PO TABS: 12.5000 mg | ORAL_TABLET | Freq: Two times a day (BID) | ORAL | 2 refills | Status: DC

## 2017-01-03 MED ORDER — TICAGRELOR 90 MG PO TABS: 90.0000 mg | ORAL_TABLET | Freq: Two times a day (BID) | ORAL | Status: DC

## 2017-01-03 NOTE — Anesthesia Procedure Notes (Signed)
Procedure Name: MAC Date/Time: 01/03/2017 11:10 AM Performed by: Mervyn Gay Pre-anesthesia Checklist: Patient identified, Patient being monitored, Timeout performed, Emergency Drugs available and Suction available Patient Re-evaluated:Patient Re-evaluated prior to inductionOxygen Delivery Method: Nasal cannula Preoxygenation: Pre-oxygenation with 100% oxygen Number of attempts: 1 Placement Confirmation: positive ETCO2 Dental Injury: Teeth and Oropharynx as per pre-operative assessment

## 2017-01-03 NOTE — Transfer of Care (Signed)
Immediate Anesthesia Transfer of Care Note  Patient: Jenna Brown  Procedure(s) Performed: Procedure(s): TRANSESOPHAGEAL ECHOCARDIOGRAM (TEE) (N/A)  Patient Location: Endoscopy Unit  Anesthesia Type:MAC  Level of Consciousness: awake, alert , oriented and patient cooperative  Airway & Oxygen Therapy: Patient Spontanous Breathing and Patient connected to nasal cannula oxygen  Post-op Assessment: Report given to RN and Post -op Vital signs reviewed and stable  Post vital signs: Reviewed and stable  Last Vitals:  Vitals:   01/03/17 0559 01/03/17 1010  BP: 123/62 (!) 128/54  Pulse: 81   Resp: 15 12  Temp: 36.5 C 36.6 C    Last Pain:  Vitals:   01/03/17 1010  TempSrc: Oral  PainSc:       Patients Stated Pain Goal: 0 (36/12/24 4975)  Complications: No apparent anesthesia complications

## 2017-01-03 NOTE — Anesthesia Preprocedure Evaluation (Addendum)
Anesthesia Evaluation  Patient identified by MRN, date of birth, ID band Patient awake    History of Anesthesia Complications (+) PONV and history of anesthetic complications  Airway Mallampati: IV     Mouth opening: Limited Mouth Opening  Dental  (+) Edentulous Lower, Edentulous Upper   Pulmonary former smoker,     + decreased breath sounds      Cardiovascular Exercise Tolerance: Poor hypertension, Pt. on medications and Pt. on home beta blockers + angina + CAD, + Past MI and + Peripheral Vascular Disease   Rhythm:Regular Rate:Normal     Neuro/Psych  Headaches, Anxiety TIACVA    GI/Hepatic hiatal hernia, GERD  Medicated and Controlled,  Endo/Other  diabetes, Well Controlled, Type 2, Insulin Dependent  Renal/GU Renal disease     Musculoskeletal  (+) Arthritis ,   Abdominal (+) + obese,   Bowel sounds: normal.  Peds  Hematology  (+) anemia ,   Anesthesia Other Findings   Reproductive/Obstetrics                             BP Readings from Last 3 Encounters:  01/03/17 (!) 128/54  08/13/15 (!) 121/52  07/27/15 108/60    Anesthesia Physical Anesthesia Plan  ASA: III  Anesthesia Plan: MAC   Post-op Pain Management:    Induction: Intravenous  Airway Management Planned: Nasal Cannula  Additional Equipment:   Intra-op Plan:   Post-operative Plan:   Informed Consent: I have reviewed the patients History and Physical, chart, labs and discussed the procedure including the risks, benefits and alternatives for the proposed anesthesia with the patient or authorized representative who has indicated his/her understanding and acceptance.   Dental advisory given  Plan Discussed with: Surgeon  Anesthesia Plan Comments:         Anesthesia Quick Evaluation

## 2017-01-03 NOTE — Progress Notes (Signed)
  [facility] Progress Note    Assessment/ Plan:   1. CKD; baseline creatinine appears to be 2.6 in 3/ 2018. Screat was 1.3 in Nov 2016. Sees Dr. Joesph July [phone] in [facility]. Cr may improve as there may be some acute kidney injury from her hypertensive emergency on presentation.  Cr trending down and near baseline.  Will sign off.  Pt reports having followup with Dr. Joesph July; will need f/u ~2 weeks after discharge. 2. Vol excess - stable 3. Severe multi-vessel CAD - deemed not surgical/ PCI candidate in 2016 4. DM2 5. HTNive crisis - improved 6. ICM 45%, ? LA density by echo; unable to do TEE x 2(Barium swallow mild thickening of esophagus in cervical region).  TEE with no LAA mass 7. R carotid bruit- Carotid dopplers showed R 1-39% and L 40-59% stenosis 11/2016.    Subjective:    TEE without atrial mass.  Cr near baseline.  Feels well.   Objective:   BP (!) 111/56 (BP Location: Right Arm)   Pulse 69   Temp 97.6 F (36.4 C) (Oral)   Resp (!) 22   Ht 5\' 4"  (1.626 m)   Wt 73.3 kg (161 lb 11.2 oz)   SpO2 97%   BMI 27.76 kg/m   Intake/Output Summary (Last 24 hours) at 01/03/17 1512 Last data filed at 01/03/17 1414  Gross per 24 hour  Intake             1747 ml  Output             2100 ml  Net             -353 ml   Weight change: -0.045 kg (-1.6 oz)  Physical Exam: OXB:[id number], cooperative Neck: R sided carotid bruit CVS:RRR Resp: clear bilaterally QAS:TMHDQ Ext:no LE edema  Imaging: No results found.  Labs: BMET  Recent Labs Lab 12/28/16 1222 12/29/16 1151 12/29/16 1349 12/30/16 0408 12/31/16 0454 01/01/17 0522 01/02/17 0357  NA 138 139 138 138 139 139 136  K 4.2 4.4 4.5 4.3 4.6 4.5 4.7  CL 100* 105 104 103 101 105 104  CO2 28 25 25 25 26 26 25   GLUCOSE 105* 88 192* 207* 231* 128* 207*  BUN 62* 59* 59* 62* 56* 58* 58*  CREATININE 3.65* 3.28* 3.41* 3.32* 3.34* 3.25* 3.28*  CALCIUM 8.7* 8.6* 8.5* 8.5* 9.0 8.8* 8.7*   CBC  Recent  Labs Lab 12/31/16 0454 01/01/17 0522 01/02/17 0357 01/03/17 0415  WBC 8.4 8.3 8.4 9.4  HGB 8.9* 9.0* 9.0* 8.4*  HCT 29.2* 29.2* 28.1* 27.2*  MCV 83.4 83.7 83.4 83.7  PLT 295 288 274 301    Medications:    . aspirin  81 mg Oral Daily  . atorvastatin  80 mg Oral q1800  . carvedilol  12.5 mg Oral BID WC  . furosemide  40 mg Oral Daily  . heparin subcutaneous  5,000 Units Subcutaneous Q8H  . hydrALAZINE  25 mg Oral Q8H  . insulin aspart  0-15 Units Subcutaneous TID WC  . insulin aspart  0-5 Units Subcutaneous QHS  . insulin aspart  5 Units Subcutaneous TID AC  . insulin glargine  25 Units Subcutaneous BID  . isosorbide mononitrate  30 mg Oral Daily  . losartan  100 mg Oral Daily  . ticagrelor  90 mg Oral BID      Madelon Lips, MD 01/03/2017, 3:12 PM

## 2017-01-03 NOTE — CV Procedure (Signed)
Brief TEE note  LVEF 45% Trivial MR and TR No LA/LAA thrombus or mass No left atrial mass Negative bubble study  For additional details see full report.  Michel Hendon C. Oval Linsey, MD, [facility]  01/03/2017  11:39 AM

## 2017-01-03 NOTE — Progress Notes (Signed)
Pt's upper partial and lower denture placed in back in patients mouth by the patient. Bracelet placed back on right wrist/brt, rn

## 2017-01-03 NOTE — Discharge Summary (Signed)
Discharge Summary    Patient ID: NORVELL URESTE,  MRN: [id number], DOB/AGE: Jan 21, 1951 66 y.o.  Admit date: 12/25/2016 Discharge date: 01/03/2017  Primary Care Provider: [name],GEORGE Primary Cardiologist: Dr. Haroldine Laws   Discharge Diagnoses    Principal Problem:   Hypertensive emergency Active Problems:   NSTEMI (non-ST elevated myocardial infarction) ([place])   Essential hypertension   HLD (hyperlipidemia)   Diabetes mellitus, insulin dependent (IDDM), uncontrolled ([place])   Acute kidney injury ([place])   Cardiomyopathy, ischemic   Acute on chronic systolic heart failure, NYHA class 3 (HCC)   CAD in native artery   Chronic kidney disease (CKD), stage IV (severe) (HCC)   CKD (chronic kidney disease), stage IV (HCC)   Allergies Allergies  Allergen Reactions  . Plavix [Clopidogrel Bisulfate] Itching  . Codeine Other (See Comments)    "makes me feel strange"  . Tylenol With Codeine #3 [Acetaminophen-Codeine] Other (See Comments)    "doesn't make me feel right"  . Tylenol [Acetaminophen] Other (See Comments)    "Doesn't feel right"      History of Present Illness     66 y.o.femalewith multivessel-CAD deemed to be a poor candidate for surgical or percutaneous revascularization,systolic HF with improved LVEF(LVEF 20% in 2016, now 45-50%),IDDM, HTN, dyslipidemia, prior CVA, and s/p right AKA who presented to [facility] on 12/25[facility]16 with chest pain and found to have small NSTEMI in the setting of SBP>26mmHg.  She was admitted in 2016 for NSTEMI. She was determined to not be a surgical/PCI candidate due to chronic kidney disease and not being a CABG candidate for chronic medical problems. She was started on DAPT with ASA and Brilinta but then was lost to follow-up until this admission. There was concern for medical non compliance and patient unclear on what medications she was taking.   Hospital Course     Consultants: Nephrology, ENT and Internal Medicine  She was admitted on  4/8 for hypertensive emergency and acute on chronic renal failure without neuro complication. Troponins elevated to Max 4.62 which was felt to be due to demand ischemia. She was started on a nitro and heparin gtt.  Lasix and Losartan where held in the setting of AKI. Her blood pressure eventually improved. A transthoracic echocardiography was performed and showed mild LVH, EF 45-50%, diffuse hypokinesis, grade 1 diastolic dysfunction and the concern for possible pass in the atrial wall, a TEE was suggested to further assess. On 4/12 they attempted TEE but the probe was unable to be inserted. TEE on 4/13 cancelled because patient ate breakfast. Therefore, it was rescheduled for Monday morning. Concern developed for ability to pass probe down patients esophagus and ENT was consulted. She had a normal  fiberoptic laryngoscopy examination with no obstruction and was cleared for TEE. The TEE was performed on 4/17, showed LVEF 45%, trivial MR and TR, no LA/LAA thrombus or mass, no left atrial mass, negative bubble study. Nephrology managed patients renal function and assisted in BP control which remained stable up until discharge. Her home Brilinta was held until after her TEE resulted, I suspect concern for atrial mass or stricture in the esophagus, but not clearly stated in notes. She will need close follow-up as many medication changes have been made during this admission. She has been seen by Dr. Montel Culver today and deemed ready for discharge home. All follow-up appointments have been scheduled. Discharge medications are listed below.  Medications: Crestor 40 mg changed to Lipitor 80 mg Metoprolol XL 25 mg changed to Coreg 12.5  mg BID Aspirin 81 mg daily Lasix 40 mg PO daily Hydralazine 25 mg TID Imdur 30 mg daily Cozaar 100 mg daily Brilinta 90 mg BID Nitro SL 0.4mg  PRN Digoxin 0.125 mg discontinued due AKI    _____________  Discharge Vitals Blood pressure (!) 111/56, pulse 69, temperature 97.6 F  (36.4 C), temperature source Oral, resp. rate (!) 22, height 5\' 4"  (1.626 m), weight 161 lb 11.2 oz (73.3 kg), SpO2 97 %.  Filed Weights   01/01/17 0625 01/02/17 0533 01/03/17 0559  Weight: 161 lb (73 kg) 161 lb 12.8 oz (73.4 kg) 161 lb 11.2 oz (73.3 kg)    Labs & Radiologic Studies     CBC  Recent Labs  01/02/17 0357 01/03/17 0415  WBC 8.4 9.4  HGB 9.0* 8.4*  HCT 28.1* 27.2*  MCV 83.4 83.7  PLT 274 476   Basic Metabolic Panel  Recent Labs  01/01/17 0522 01/02/17 0357  NA 139 136  K 4.5 4.7  CL 105 104  CO2 26 25  GLUCOSE 128* 207*  BUN 58* 58*  CREATININE 3.25* 3.28*  CALCIUM 8.8* 8.7*    Dg Chest 2 View Result Date: 12/25/2016 No active cardiopulmonary disease.   Dg Esophagus Result Date: 12/31/2016  Equivocal mild wall thickening of the very proximal cervical esophagus. Consider direct inspection. No evidence of obstructing mass or process.   US Renal Result Date: 12/26/2016 : 1. No evidence of hydronephrosis. 2. Mildly increased renal parenchymal echogenicity raises concern for medical renal disease. 3. 1.4 cm mildly complex cystic focus at the upper pole of the right kidney is likely benign, only minimally changed from 2017. 4. Cholelithiasis.  Gallbladder otherwise unremarkable.    Diagnostic Studies/Procedures    Right/Left Heart Cath and Coronary Angiography 03/2015   Prox LAD to Mid LAD lesion, 90% stenosed.  Ost Cx to Mid Cx lesion, 95% stenosed.  Ost 2nd Mrg to 2nd Mrg lesion, 80% stenosed.  Mid Cx lesion, 90% stenosed.  Prox RCA lesion, 60% stenosed.  Mid RCA lesion, 50% stenosed. _____________    Disposition   Pt is being discharged home today in good condition.  Follow-up Plans & Appointments    Follow-up Information    [name], PA-C Follow up on 01/10/2017.   Specialty:  Physician Assistant Why:  Please go to the heart and vascular center for a 3:30pm appointment. Please arrive 15 minutes early.  parking code  is [id number] Contact information: [place] [address] [phone]          Discharge Instructions    Diet - low sodium heart healthy    Complete by:  As directed    Increase activity slowly    Complete by:  As directed    May shower / Bathe    Complete by:  As directed       Discharge Medications   Allergies as of 01/03/2017      Reactions   Plavix [clopidogrel Bisulfate] Itching   Codeine Other (See Comments)   "makes me feel strange"   Tylenol With Codeine #3 [acetaminophen-codeine] Other (See Comments)   "doesn't make me feel right"   Tylenol [acetaminophen] Other (See Comments)   "Doesn't feel right"       Medication List    STOP taking these medications   digoxin 0.125 MG tablet Commonly known as:  LANOXIN   metoprolol succinate 25 MG 24 hr tablet Commonly known as:  TOPROL-XL   pravastatin 40 MG tablet Commonly known  as:  PRAVACHOL     TAKE these medications   acetaminophen 650 MG CR tablet Commonly known as:  TYLENOL Take 650 mg by mouth at bedtime.   ALPRAZolam 0.25 MG tablet Commonly known as:  XANAX Take 1 tablet (0.25 mg total) by mouth 3 (three) times daily as needed for anxiety.   aspirin 81 MG chewable tablet Chew 81 mg by mouth daily.   atorvastatin 80 MG tablet Commonly known as:  LIPITOR Take 1 tablet (80 mg total) by mouth daily at 6 PM.   carvedilol 12.5 MG tablet Commonly known as:  COREG Take 1 tablet (12.5 mg total) by mouth 2 (two) times daily with a meal.   furosemide 40 MG tablet Commonly known as:  LASIX Take 1 tablet (40 mg total) by mouth daily.   hydrALAZINE 25 MG tablet Commonly known as:  APRESOLINE Take 1 tablet (25 mg total) by mouth every 8 (eight) hours.   insulin aspart 100 UNIT/ML injection Commonly known as:  novoLOG Inject 5 Units into the skin 3 (three) times daily before meals.   insulin glargine 100 UNIT/ML injection Commonly known as:  LANTUS Inject 25 Units into the skin 2 (two) times  daily. What changed:  Another medication with the same name was added. Make sure you understand how and when to take each.   insulin glargine 100 UNIT/ML injection Commonly known as:  LANTUS Inject 0.25 mLs (25 Units total) into the skin 2 (two) times daily. What changed:  You were already taking a medication with the same name, and this prescription was added. Make sure you understand how and when to take each.   insulin lispro 100 UNIT/ML injection Commonly known as:  HUMALOG Inject 5 Units into the skin 3 (three) times daily before meals.   Insulin Pen Needle 29G X 12MM Misc Commonly known as:  AURORA PEN NEEDLES 5 Units by Does not apply route 3 (three) times daily before meals.   isosorbide mononitrate 30 MG 24 hr tablet Commonly known as:  IMDUR take 1 tablet by mouth once daily   losartan 100 MG tablet Commonly known as:  COZAAR Take 100 mg by mouth daily.   nitroGLYCERIN 0.4 MG SL tablet Commonly known as:  NITROSTAT Place 1 tablet (0.4 mg total) under the tongue every 5 (five) minutes x 3 doses as needed for chest pain.   ticagrelor 90 MG Tabs tablet Commonly known as:  BRILINTA Take 1 tablet (90 mg total) by mouth 2 (two) times daily.           Outstanding Labs/Studies   Will need repeat FLP and ALT in 6 weeks.    Duration of Discharge Encounter   Greater than 30 minutes including physician time.  Kristopher Glee PA-C 01/03/2017, 4:50 PM

## 2017-01-03 NOTE — Interval H&P Note (Signed)
History and Physical Interval Note:  01/03/2017 11:02 AM  Jenna Brown  has presented today for surgery, with the diagnosis of left atrial mass  The various methods of treatment have been discussed with the patient and family. After consideration of risks, benefits and other options for treatment, the patient has consented to  Procedure(s): TRANSESOPHAGEAL ECHOCARDIOGRAM (TEE) (N/A) as a surgical intervention .  The patient's history has been reviewed, patient examined, no change in status, stable for surgery.  I have reviewed the patient's chart and labs.  Questions were answered to the patient's satisfaction.     Skeet Latch, MD

## 2017-01-03 NOTE — H&P (View-Only) (Signed)
Progress Note  Patient Name: Jenna Brown Date of Encounter: 01/02/2017  Primary Cardiologist: Dr. Haroldine Laws  Subjective   Denies any chest pain or SOB lat night. Down a total of 10 mL this admission, weight up 3 lbs BP currently well controlled.  Pending Transesophageal Echo today  Inpatient Medications    Scheduled Meds: . aspirin  81 mg Oral Daily  . atorvastatin  80 mg Oral q1800  . carvedilol  12.5 mg Oral BID WC  . furosemide  40 mg Oral Daily  . heparin subcutaneous  5,000 Units Subcutaneous Q8H  . hydrALAZINE  25 mg Oral Q8H  . insulin aspart  0-15 Units Subcutaneous TID WC  . insulin aspart  0-5 Units Subcutaneous QHS  . insulin aspart  5 Units Subcutaneous TID AC  . insulin glargine  25 Units Subcutaneous BID  . isosorbide mononitrate  30 mg Oral Daily  . losartan  100 mg Oral Daily   Continuous Infusions: . sodium chloride 20 mL/hr at 01/02/17 0609   PRN Meds: acetaminophen, ALPRAZolam, nitroGLYCERIN, ondansetron (ZOFRAN) IV   Vital Signs    Vitals:   01/01/17 1400 01/01/17 2026 01/02/17 0533 01/02/17 0929  BP: (!) 124/58 (!) 133/59 (!) 147/50 (!) 151/65  Pulse: 79 73  82  Resp: 18 (!) 21    Temp: 98.7 F (37.1 C) 98.5 F (36.9 C) 98 F (36.7 C)   TempSrc: Oral Oral Oral   SpO2: 98% 98% 97%   Weight:   161 lb 12.8 oz (73.4 kg)   Height:        Intake/Output Summary (Last 24 hours) at 01/02/17 1048 Last data filed at 01/02/17 0944  Gross per 24 hour  Intake              480 ml  Output             1800 ml  Net            -1320 ml   Filed Weights   12/31/16 0500 01/01/17 0625 01/02/17 0533  Weight: 161 lb 14.4 oz (73.4 kg) 161 lb (73 kg) 161 lb 12.8 oz (73.4 kg)    Telemetry    NSR - Personally Reviewed  ECG      NSR, inferior Q waves, ST depression and TWI in I, aVL, V3-V6, unchanged  (12/31/16) - Personally Reviewed  Physical Exam   Comfortable GEN:No acute distress.   Neck:No JVD Cardiac:RRR, no murmurs, rubs, or gallops.   Respiratory:Clear to auscultation bilaterally. AJ:OINO, nontender, non-distended  MS:No edema; No deformity. R AKA Neuro:Nonfocal  Psych: Normal affect   Labs    Chemistry Recent Labs Lab 12/31/16 0454 01/01/17 0522 01/02/17 0357  NA 139 139 136  K 4.6 4.5 4.7  CL 101 105 104  CO2 26 26 25   GLUCOSE 231* 128* 207*  BUN 56* 58* 58*  CREATININE 3.34* 3.25* 3.28*  CALCIUM 9.0 8.8* 8.7*  GFRNONAA 14* 14* 14*  GFRAA 16* 16* 16*  ANIONGAP 12 8 7      Hematology Recent Labs Lab 12/31/16 0454 01/01/17 0522 01/02/17 0357  WBC 8.4 8.3 8.4  RBC 3.50* 3.49* 3.37*  HGB 8.9* 9.0* 9.0*  HCT 29.2* 29.2* 28.1*  MCV 83.4 83.7 83.4  MCH 25.4* 25.8* 26.7  MCHC 30.5 30.8 32.0  RDW 12.9 13.0 13.1  PLT 295 288 274      Radiology      Dg Esophagus  Result Date: 12/31/2016 CLINICAL DATA:  66 year old female with inability  to perform transesophageal echo due to apparent obstruction. EXAM: ESOPHOGRAM/BARIUM SWALLOW TECHNIQUE: Single contrast examination was performed using  thin barium. FLUOROSCOPY TIME:  Fluoroscopy Time:  3 minutes 48 seconds Number of Acquired Spot Images: 7. Total of 126 images which includes screen saved cine images COMPARISON:  None. FINDINGS: This study is somewhat limited due to patient's condition and inability to stand. There is equivocal mild wall thickening of the very proximal cervical esophagus. No other definite mass, filling defect or obstructing cause identified. Contrast flows easily into the stomach. Normal esophageal peristalsis noted. IMPRESSION: Equivocal mild wall thickening of the very proximal cervical esophagus. Consider direct inspection. No evidence of obstructing mass or process. Electronically Signed   By: Margarette Canada M.D.   On: 12/31/2016 16:08    Cardiac Studies   Pending Transesophageal Echo    Transthoracic Echocardiography 12/27/2016  Study Conclusions  - Left ventricle: The cavity size was normal. Wall thickness was    increased in a pattern of mild LVH. Systolic function was mildly   reduced. The estimated ejection fraction was in the range of 45%   to 50%. Diffuse hypokinesis. Doppler parameters are consistent   with abnormal left ventricular relaxation (grade 1 diastolic   dysfunction). Doppler parameters are consistent with high   ventricular filling pressure. - Mitral valve: Calcified annulus.  Impressions:  - Mild global reduction in LV systolic function; grade 1 diastolic   dysfunction; left atrial density may be left atrial wall but   cannot exclude mass; suggest TEE to further assess.  Patient Profile        66 y.o. female with multivessel-CAD deemed to be a poor candidate for surgical or percutaneous revascularization,systolic HF with improved LVEF(LVEF 20% in 2016, now 45-50%)IDDM, HTN, dyslipidemia, prior CVA, and s/p right AKA who presented with small NSTEMI in the setting of SBP>279mmHg  (Prox LAD to Mid LAD lesion 90%, Ost Cx to Mid Cx lesion 95%, Ost 2nd Mrg to 2nd Mrg lesion 80%,Mid Cx lesion 90%, Prox RCA lesion 60%, Mid RCA lesion 50%)  Assessment & Plan    1.Hypertensive emergency:  - BP improved; stable  2. NSTEMI - Poor candidate for revascularization with either PCI or surgery - Continue medical management: ASA, BB, statin. Restart ticagrelor after TEE Monday  3.Dyslipidemia - LDL was 197 (noncompliance versus ineffective agent?) - Changed to Lipitor 80mg  daily - Will need repeat FLP and ALT in 6 weeks.   4.AKI;reduced GFR in the setting of HTN emergency  - stable creat, but worsened compared to2.[facility] at [facility] in March 2018 3.34 --> 3.25 --> 3.28   5.CHF, EF improved to 45% - appears clinically euvolemic and echo does not show elevation in mean LA pressure - diuretic therapy per nephrology - not a candidate for spironolactone due to renal disease.  6.IDDM  - requiringInsulin   7. Left atrial densitybut cannot exclude mass by recent TTE.   - Unable to perform TEE last week, barium swallow shows equivocal swelling in the upper esophagus which may simply represent inflammation from trauma from last week. No obstructive lesion. - another attempt with anesthesia on Monday--- plan for TEE today.   Plan: To discuss with MD, Restart Ticagrelor after TEE today   Signed, Linus Mako, PA-C  01/02/2017, 10:48 AM    I have seen and examined the patient along with GREENE,TIFFANY G, PA-C.  I have reviewed the chart, notes and new data.  I agree with PA/NP's note.  Key new complaints: no dysphagia. Dr. Radford Pax  requested ENT evaluation before proceeding with TEE. Dr. Benjamine Mola will see in consultation today. Key examination changes: comfortable lying flat Key new findings / data: creat 3.28  PLAN: Weight, renal function and symptoms all stable on current diuretic dose. I doubt that there is an obstructive pharyngeal abnormality, but will wait for ENT evaluation before TEE with deeper sedation.  Addendum: patient reports that she has had cleft palate repair - I wonder if the unusual arched anatomy may be interfering with probe passage. Reportedly, scope performed by ENT did not show obstruction (but report not yet available).  Sanda Klein, MD, [place] [phone] 01/02/2017, 1:11 PM

## 2017-01-03 NOTE — Progress Notes (Signed)
Progress Note  Patient Name: Jenna Brown Date of Encounter: 01/03/2017  Primary Cardiologist: Bensimhon  Subjective   Had uneventful TEE today, no mass in LAA. Denies angina. Ready to go home.  Inpatient Medications    Scheduled Meds: . aspirin  81 mg Oral Daily  . atorvastatin  80 mg Oral q1800  . carvedilol  12.5 mg Oral BID WC  . furosemide  40 mg Oral Daily  . heparin subcutaneous  5,000 Units Subcutaneous Q8H  . hydrALAZINE  25 mg Oral Q8H  . insulin aspart  0-15 Units Subcutaneous TID WC  . insulin aspart  0-5 Units Subcutaneous QHS  . insulin aspart  5 Units Subcutaneous TID AC  . insulin glargine  25 Units Subcutaneous BID  . isosorbide mononitrate  30 mg Oral Daily  . losartan  100 mg Oral Daily   Continuous Infusions:  PRN Meds: acetaminophen, ALPRAZolam, nitroGLYCERIN, ondansetron (ZOFRAN) IV   Vital Signs    Vitals:   01/03/17 1141 01/03/17 1150 01/03/17 1156 01/03/17 1408  BP: (!) 104/38 (!) 91/49  (!) 111/56  Pulse:  68 69   Resp: 16 19 (!) 22   Temp: 97.9 F (36.6 C)   97.6 F (36.4 C)  TempSrc: Oral   Oral  SpO2: 98% 97% 97%   Weight:      Height:        Intake/Output Summary (Last 24 hours) at 01/03/17 1422 Last data filed at 01/03/17 1414  Gross per 24 hour  Intake             1747 ml  Output             2100 ml  Net             -353 ml   Filed Weights   01/01/17 0625 01/02/17 0533 01/03/17 0559  Weight: 73 kg (161 lb) 73.4 kg (161 lb 12.8 oz) 73.3 kg (161 lb 11.2 oz)    Telemetry    NSR - Personally Reviewed  Physical Exam  Alert, smiling, comfortable GEN: No acute distress.   Neck: No JVD Cardiac: RRR, no murmurs, rubs, or gallops.  Respiratory: Clear to auscultation bilaterally. GI: Soft, nontender, non-distended  MS: No edema; No deformity. Neuro:  Nonfocal  Psych: Normal affect   Labs    Chemistry Recent Labs Lab 12/31/16 0454 01/01/17 0522 01/02/17 0357  NA 139 139 136  K 4.6 4.5 4.7  CL 101 105 104    CO2 26 26 25   GLUCOSE 231* 128* 207*  BUN 56* 58* 58*  CREATININE 3.34* 3.25* 3.28*  CALCIUM 9.0 8.8* 8.7*  GFRNONAA 14* 14* 14*  GFRAA 16* 16* 16*  ANIONGAP 12 8 7      Hematology Recent Labs Lab 01/01/17 0522 01/02/17 0357 01/03/17 0415  WBC 8.3 8.4 9.4  RBC 3.49* 3.37* 3.25*  HGB 9.0* 9.0* 8.4*  HCT 29.2* 28.1* 27.2*  MCV 83.7 83.4 83.7  MCH 25.8* 26.7 25.8*  MCHC 30.8 32.0 30.9  RDW 13.0 13.1 12.9  PLT 288 274 301    Radiology    No results found.  Cardiac Studies   TEE  LVEF 45% Trivial MR and TR No LA/LAA thrombus or mass No left atrial mass Negative bubble study   Patient Profile     66 y.o. female with multivessel-CAD deemed to be a poor candidate for surgical or percutaneous revascularization,systolic HF with improved LVEF(LVEF 20% in 2016, now 45-50%)IDDM, HTN, dyslipidemia, prior CVA, and s/p right  AKA who presented with small NSTEMI in the setting of SBP>217mmHg  (Prox LAD to Mid LAD lesion 90%, Ost Cx to Mid Cx lesion 95%, Ost 2nd Mrg to 2nd Mrg lesion 80%,Mid Cx lesion 90%, Prox RCA lesion 60%, Mid RCA lesion 50%)  Assessment & Plan    1.Hypertensive emergency:  - BP great control (a little low with sedation). Continue current meds. As outpatient, would try to titrate up carvedilol to max dose and stop hydralazine if possible.  2. NSTEMI - Poor candidate for revascularization with either PCI or surgery - Continue medical management: ASA, BB, statin. Restart ticagrelor.  3.Dyslipidemia - LDL was 197  - Now on high dose atorvastatin, repeat FLP and ALT in 6 weeks.   4.AKI;reduced GFR in the setting of HTN emergency  - stable creat, but worsened compared to2.66 at [facility] in March 2018  5.CHF, EF improved to 45% - keep on spironolactone 40 mg daily, needs early reevaluation in clinic (one week TOC)  6.IDDM  - requiringinsulin   7. Left atrial density - artifactual , not confirmed on TEE  Signed, Sanda Klein, MD  01/03/2017, 2:22 PM

## 2017-01-03 NOTE — Progress Notes (Signed)
CSW was consulted by RN for late discharge. RN stated that patient needs assistance with transport back home. Patient is wheelchair bound and is unable to ride bur or cab. CSW arranged PTAR pickup for patient.  Rhea Pink, MSW,  [place]

## 2017-01-03 NOTE — Progress Notes (Signed)
Pt discharged to home via w/c condition stable, education complete, accompanied by brother.  Edward Qualia RN

## 2017-01-03 NOTE — Progress Notes (Signed)
Patient had peripheral IV left later forearm on arrival to endoscopy today for TEE. Anterior left forearm noted to be bruised with swelling. Patient reports pain 2 inches above catheter insertion site. This IV was removed, Dr. Oval Linsey made aware. See flowsheets for IV documentation.

## 2017-01-04 ENCOUNTER — Encounter (HOSPITAL_COMMUNITY): Payer: Self-pay | Admitting: Cardiovascular Disease

## 2017-01-04 NOTE — Anesthesia Postprocedure Evaluation (Signed)
Anesthesia Post Note  Patient: Jenna Brown  Procedure(s) Performed: Procedure(s) (LRB): TRANSESOPHAGEAL ECHOCARDIOGRAM (TEE) (N/A)  Patient location during evaluation: PACU Anesthesia Type: MAC Level of consciousness: awake Pain management: pain level controlled Respiratory status: spontaneous breathing Cardiovascular status: stable Anesthetic complications: no       Last Vitals:  Vitals:   01/03/17 1156 01/03/17 1408  BP:  (!) 111/56  Pulse: 69   Resp: (!) 22   Temp:  36.4 C    Last Pain:  Vitals:   01/03/17 1408  TempSrc: Oral  PainSc:                  Teodor Prater

## 2017-01-10 ENCOUNTER — Encounter (HOSPITAL_COMMUNITY): Payer: Medicare Other

## 2017-01-11 ENCOUNTER — Encounter (HOSPITAL_COMMUNITY): Payer: Medicare Other

## 2017-01-18 ENCOUNTER — Ambulatory Visit (HOSPITAL_COMMUNITY)
Admission: RE | Admit: 2017-01-18 | Discharge: 2017-01-18 | Disposition: A | Discharge status: Home / Self Care | Payer: Medicare Other | Source: Ambulatory Visit | Attending: Internal Medicine | Admitting: Internal Medicine

## 2017-01-18 ENCOUNTER — Encounter (HOSPITAL_COMMUNITY): Payer: Self-pay

## 2017-01-18 VITALS — BP 136/74 | HR 76 | Wt 152.4 lb

## 2017-01-18 DIAGNOSIS — G43909 Migraine, unspecified, not intractable, without status migrainosus: Secondary | ICD-10-CM | POA: Diagnosis not present

## 2017-01-18 DIAGNOSIS — Z803 Family history of malignant neoplasm of breast: Secondary | ICD-10-CM | POA: Insufficient documentation

## 2017-01-18 DIAGNOSIS — E1122 Type 2 diabetes mellitus with diabetic chronic kidney disease: Secondary | ICD-10-CM | POA: Insufficient documentation

## 2017-01-18 DIAGNOSIS — E785 Hyperlipidemia, unspecified: Secondary | ICD-10-CM | POA: Insufficient documentation

## 2017-01-18 DIAGNOSIS — Z7982 Long term (current) use of aspirin: Secondary | ICD-10-CM | POA: Insufficient documentation

## 2017-01-18 DIAGNOSIS — F419 Anxiety disorder, unspecified: Secondary | ICD-10-CM | POA: Diagnosis not present

## 2017-01-18 DIAGNOSIS — I739 Peripheral vascular disease, unspecified: Secondary | ICD-10-CM

## 2017-01-18 DIAGNOSIS — N184 Chronic kidney disease, stage 4 (severe): Secondary | ICD-10-CM

## 2017-01-18 DIAGNOSIS — G459 Transient cerebral ischemic attack, unspecified: Secondary | ICD-10-CM

## 2017-01-18 DIAGNOSIS — M1990 Unspecified osteoarthritis, unspecified site: Secondary | ICD-10-CM | POA: Diagnosis not present

## 2017-01-18 DIAGNOSIS — Z87891 Personal history of nicotine dependence: Secondary | ICD-10-CM | POA: Diagnosis not present

## 2017-01-18 DIAGNOSIS — I5022 Chronic systolic (congestive) heart failure: Secondary | ICD-10-CM | POA: Insufficient documentation

## 2017-01-18 DIAGNOSIS — Z89611 Acquired absence of right leg above knee: Secondary | ICD-10-CM | POA: Diagnosis not present

## 2017-01-18 DIAGNOSIS — I2510 Atherosclerotic heart disease of native coronary artery without angina pectoris: Secondary | ICD-10-CM | POA: Diagnosis not present

## 2017-01-18 DIAGNOSIS — K219 Gastro-esophageal reflux disease without esophagitis: Secondary | ICD-10-CM | POA: Insufficient documentation

## 2017-01-18 DIAGNOSIS — I130 Hypertensive heart and chronic kidney disease with heart failure and stage 1 through stage 4 chronic kidney disease, or unspecified chronic kidney disease: Secondary | ICD-10-CM | POA: Insufficient documentation

## 2017-01-18 DIAGNOSIS — E1151 Type 2 diabetes mellitus with diabetic peripheral angiopathy without gangrene: Secondary | ICD-10-CM | POA: Insufficient documentation

## 2017-01-18 DIAGNOSIS — I252 Old myocardial infarction: Secondary | ICD-10-CM | POA: Insufficient documentation

## 2017-01-18 DIAGNOSIS — Z833 Family history of diabetes mellitus: Secondary | ICD-10-CM | POA: Diagnosis not present

## 2017-01-18 DIAGNOSIS — I10 Essential (primary) hypertension: Secondary | ICD-10-CM

## 2017-01-18 DIAGNOSIS — Z992 Dependence on renal dialysis: Secondary | ICD-10-CM | POA: Insufficient documentation

## 2017-01-18 DIAGNOSIS — Z8673 Personal history of transient ischemic attack (TIA), and cerebral infarction without residual deficits: Secondary | ICD-10-CM | POA: Insufficient documentation

## 2017-01-18 DIAGNOSIS — N179 Acute kidney failure, unspecified: Secondary | ICD-10-CM | POA: Diagnosis not present

## 2017-01-18 DIAGNOSIS — Z794 Long term (current) use of insulin: Secondary | ICD-10-CM | POA: Diagnosis not present

## 2017-01-18 LAB — BASIC METABOLIC PANEL
ANION GAP: 8 (ref 5–15)
BUN: 80 mg/dL — AB (ref 6–20)
CHLORIDE: 105 mmol/L (ref 101–111)
CO2: 25 mmol/L (ref 22–32)
Calcium: 9.1 mg/dL (ref 8.9–10.3)
Creatinine, Ser: 3.36 mg/dL — ABNORMAL HIGH (ref 0.44–1.00)
GFR, EST AFRICAN AMERICAN: 16 mL/min — AB (ref >60)
GFR, EST NON AFRICAN AMERICAN: 14 mL/min — AB (ref >60)
Glucose, Bld: 80 mg/dL (ref 65–99)
POTASSIUM: 4.5 mmol/L (ref 3.5–5.1)
SODIUM: 138 mmol/L (ref 135–145)

## 2017-01-18 NOTE — Patient Instructions (Signed)
Routine lab work today. Will notify you of abnormal results, otherwise no news is good news!  No changes to medication at this time.  Follow up with Dr. Haroldine Laws in 2 months.  Do the following things EVERYDAY: 1) Weigh yourself in the morning before breakfast. Write it down and keep it in a log. 2) Take your medicines as prescribed 3) Eat low salt foods-Limit salt (sodium) to 2000 mg per day.  4) Stay as active as you can everyday 5) Limit all fluids for the day to less than 2 liters

## 2017-01-18 NOTE — Progress Notes (Addendum)
Advanced Heart Failure Clinic Note   Patient ID: Jenna Brown, female   DOB: [birth year], 66 y.o.   MRN: [id number]   PCP: Dr. Vista Lawman Primary HF Cardiologist: Dr Haroldine Laws Renal:  Hulen Shouts [phone])   HPI:  Jenna Brown is a 66 y.o. female with hyperlipidemia, CVA, hypertension, type 2 diabetes mellitus, severe CAD, right AKA .   Admitted to [facility] 04/10/15 with increased dyspnea. Had RHC/LHC as noted below. Not candidate for CABG per Dr Prescott Gum due to comorbidities and poor targets. Required short term milrinone for cardiogenic shock. As she improved and fully diuresed, milrinone stopped. She had recommendations for lasix, spiro, losartan, hydralazine, and imdur.  Discharge weight was 158 pounds.     Most recently admitted 4/8 -> 01/03/2017 with hypertensive emergency and AKI. BP up to 225/98 Troponin max to 4.62 felt to be demand ischemia. Started on Nitro + heparin gtt. Lasix and losartan held in AKI.  Renal consulted. Medications heavily adjusted.   Echo 12/27/16 LVEF 45-50%, diffuse hypokinesis, Grade 1 DD and ? Mass on left atrial wall. TEE 01/03/17 45%, trivial MR and TR, no LA/LAA thrombus or mass, negative bubble study.   Pt presents today for post hospital follow up. Not seen in HF clinic since 08/2015. Feels good after recent hospitalization. Lives at home with sister and mother. Creatinine has been quite elevated recently. Sees renal in [place] (Dr. Joesph July). Pt states she has not discussed dialysis with anyone.  Has had to take 3 NTG since home, but symptoms have varied. Occasional gets chest discomfort at rest. Gets around the home in a WC.  Denies orthopnea. Denies peripheral edema or abdominal distention. Taking all medication as directed. She manages them herself.   RHC/LHC 04/14/15  Prox LAD to Mid LAD lesion, 90% stenosed. Diffuse disease  Ost Cx to Mid Cx lesion, 95% stenosed.  Ost 2nd Mrg to 2nd Mrg lesion, 80% stenosed.  Mid Cx lesion, 90% stenosed.  Prox  RCA lesion, 60% stenosed.  Mid RCA lesion, 50% stenosed.   Labs 04/22/2015 K 4.5 Creatinine 1.24  Labs 05/14/2015; K 4.3  Labs 06/10/15; K 4.1, Creatinine 1.34  ROS: All systems negative except as listed in HPI, PMH and Problem List.  SH:  Social History   Social History  . Marital status: Single    Spouse name: N/A  . Number of children: N/A  . Years of education: N/A   Occupational History  . Not on file.   Social History Main Topics  . Smoking status: Former Smoker    Packs/day: 0.25    Years: 40.00    Types: Cigarettes    Quit date: 02/27/2012  . Smokeless tobacco: Never Used  . Alcohol use No  . Drug use: No  . Sexual activity: No   Other Topics Concern  . Not on file   Social History Narrative  . No narrative on file   FH:  Family History  Problem Relation Age of Onset  . Cancer Mother     breast  . Diabetes Father     Past Medical History:  Diagnosis Date  . Anxiety    takes Xanax prn  . Arthritis   . GERD (gastroesophageal reflux disease)   . H/O hiatal hernia   . Headache(784.0)    when b/p is elevated  . Hemorrhoids   . Hyperlipidemia    takes Simvastatin daily  . Hypertension    takes Amlodipine/HCTZ/Losartan daily  . Migraines   . Myocardial infarction (  [place]) 1990's  . Peripheral vascular disease ([place])   . PONV (postoperative nausea and vomiting)   . Stroke [facility])    TIA history  . Type II diabetes mellitus (HCC)    takes Glucovance and Januvia daily  . Vertigo    takes Ativert prn    Current Outpatient Prescriptions  Medication Sig Dispense Refill  . acetaminophen (TYLENOL) 650 MG CR tablet Take 650 mg by mouth at bedtime.     . ALPRAZolam (XANAX) 0.25 MG tablet Take 1 tablet (0.25 mg total) by mouth 3 (three) times daily as needed for anxiety. 12 tablet 0  . aspirin 81 MG chewable tablet Chew 81 mg by mouth daily.    . carvedilol (COREG) 12.5 MG tablet Take 1 tablet (12.5 mg total) by mouth 2 (two) times daily with a meal. 60  tablet 2  . furosemide (LASIX) 40 MG tablet Take 1 tablet (40 mg total) by mouth daily. 90 tablet 3  . hydrALAZINE (APRESOLINE) 25 MG tablet Take 1 tablet (25 mg total) by mouth every 8 (eight) hours. 90 tablet 2  . insulin glargine (LANTUS) 100 UNIT/ML injection Inject 25 Units into the skin 2 (two) times daily.     . insulin glargine (LANTUS) 100 UNIT/ML injection Inject 0.25 mLs (25 Units total) into the skin 2 (two) times daily. 10 mL 11  . insulin lispro (HUMALOG) 100 UNIT/ML injection Inject 5 Units into the skin 3 (three) times daily before meals.    . Insulin Pen Needle (AURORA PEN NEEDLES) 29G X 12MM MISC 5 Units by Does not apply route 3 (three) times daily before meals. 100 each 0  . isosorbide mononitrate (IMDUR) 30 MG 24 hr tablet take 1 tablet by mouth once daily 30 tablet 6  . losartan (COZAAR) 100 MG tablet Take 100 mg by mouth daily.    . nitroGLYCERIN (NITROSTAT) 0.4 MG SL tablet Place 1 tablet (0.4 mg total) under the tongue every 5 (five) minutes x 3 doses as needed for chest pain. 25 tablet 3  . pravastatin (PRAVACHOL) 40 MG tablet Take 40 mg by mouth daily.    . ticagrelor (BRILINTA) 90 MG TABS tablet Take 1 tablet (90 mg total) by mouth 2 (two) times daily. 60 tablet 11  . insulin aspart (NOVOLOG) 100 UNIT/ML injection Inject 5 Units into the skin 3 (three) times daily before meals. 10 mL 11   No current facility-administered medications for this encounter.     Vitals:   01/18/17 1527  BP: 136/74  Pulse: 76  SpO2: 99%  Weight: 152 lb 6.4 oz (69.1 kg)    PHYSICAL EXAM:  General: Elderly and frail. Arrived in wheelchair.  HEENT: Normal Neck: supple. JVD 6-7 cm. Carotids 2+ bilat; no bruits. No thyromegaly or nodule noted. Cor: PMI nondisplaced. RRR, No M/G/R noted Lungs: CTAB, normal effort. Abdomen: soft, non-tender, distended, no HSM. No bruits or masses. +BS  Extremities: no cyanosis, clubbing, or rash. LLE no edema. R AKA. Neuro: alert & orientedx3,  cranial nerves grossly intact. moves all 4 extremities w/o difficulty. Affect pleasant   ASSESSMENT & PLAN:  1. Chronic systolic CHF.   On cath 04/14/15 ICM - Echo 12/27/16 LVEF 45-50%, diffuse hypokinesis, Grade 1 DD and ? Mass on left atrial wall. TEE 01/03/17 45%, trivial MR and TR, no LA/LAA thrombus or mass, negative bubble study.  - NYHA difficult due to AKA but seems to be NYHA III.  - Volume status looks stable on exam. Continue lasix 40 mg  daily.   -Continue Coreg 12.5 mg BID - Continue Hydralazine 25 mg TID - Continue imdur 30 mg daily - Continue coxaar 100 mg daily. - Digoxin stopped due to AKI.   - Reinforced fluid restriction to < 2 L daily, sodium restriction to less than 2000 mg daily, and the importance of daily weights.   2.CAD with ICM per cath 7/26  - Atypical chest pain at times, but does have hx of CAD as above. Recent troponin elevation likely 2/2 demand ischemia with HTN emergency.   - Meds as above. Continue ASA.  - Continue brilinta.  - Limited increase of statin with aches. Now on Pravachol and tolerating OK.  - Poor revascularization candidate with comorbidities and extent of CAD. Pt also now has CKD IV. Recent Echo with improved EF. Would not pursue cath at this time, especially with CKD, unless dialysis were imminent or symptoms worsen and benefits outweigh risk.  3. PAD s/p R AKA - Stable.  4. DMII  - Per PCP 5. TIA - on statin and aspirin. No change.  6. CKD Stage IV - Sees. Dr. Joesph July in [facility].  - Dialysis has not been discussed per patient.  7. HTN - Much improved after recent hospitalization. Stable on current regimen - Will not push meds with CKD IV.   Will not change medications at this time with tenuous renal function.  CAD plan as above. Will review with MD. RTC 2 month. Pt instructed to call with any worsening chest pain or symptoms. Labs today.   [name], PA-C  3:40 PM   Greater than 50% of the 25 minute visit was spent  in counseling/coordination of care regarding disease state education, medication reconciliation, and fluid/salt restriction. This time does not include time spent reviewing previous chart notes, lab results, and procedural reports.

## 2017-01-18 NOTE — Addendum Note (Signed)
Encounter addended by: [name], PA-C on: 01/18/2017  4:56 PM<BR>    Actions taken: Sign clinical note

## 2017-01-19 ENCOUNTER — Encounter (HOSPITAL_COMMUNITY): Payer: Self-pay

## 2017-01-19 NOTE — Progress Notes (Signed)
Lab results from yesterdays CHF OV faxed to [facility] per patient request.  Renee Pain, RN

## 2017-03-03 DIAGNOSIS — R809 Proteinuria, unspecified: Secondary | ICD-10-CM | POA: Diagnosis not present

## 2017-03-03 DIAGNOSIS — D649 Anemia, unspecified: Secondary | ICD-10-CM | POA: Diagnosis not present

## 2017-03-03 DIAGNOSIS — N189 Chronic kidney disease, unspecified: Secondary | ICD-10-CM | POA: Diagnosis not present

## 2017-03-03 DIAGNOSIS — E7800 Pure hypercholesterolemia, unspecified: Secondary | ICD-10-CM | POA: Diagnosis not present

## 2017-03-03 DIAGNOSIS — M109 Gout, unspecified: Secondary | ICD-10-CM | POA: Diagnosis not present

## 2017-03-03 DIAGNOSIS — I10 Essential (primary) hypertension: Secondary | ICD-10-CM | POA: Diagnosis not present

## 2017-03-03 DIAGNOSIS — E119 Type 2 diabetes mellitus without complications: Secondary | ICD-10-CM | POA: Diagnosis not present

## 2017-03-03 DIAGNOSIS — D631 Anemia in chronic kidney disease: Secondary | ICD-10-CM | POA: Diagnosis not present

## 2017-03-03 DIAGNOSIS — I509 Heart failure, unspecified: Secondary | ICD-10-CM | POA: Diagnosis not present

## 2017-03-03 DIAGNOSIS — N183 Chronic kidney disease, stage 3 (moderate): Secondary | ICD-10-CM | POA: Diagnosis not present

## 2017-03-13 DIAGNOSIS — I119 Hypertensive heart disease without heart failure: Secondary | ICD-10-CM | POA: Diagnosis not present

## 2017-03-13 DIAGNOSIS — I10 Essential (primary) hypertension: Secondary | ICD-10-CM | POA: Diagnosis not present

## 2017-03-13 DIAGNOSIS — N183 Chronic kidney disease, stage 3 (moderate): Secondary | ICD-10-CM | POA: Diagnosis not present

## 2017-03-13 DIAGNOSIS — D638 Anemia in other chronic diseases classified elsewhere: Secondary | ICD-10-CM | POA: Diagnosis not present

## 2017-03-13 DIAGNOSIS — E559 Vitamin D deficiency, unspecified: Secondary | ICD-10-CM | POA: Diagnosis not present

## 2017-03-13 DIAGNOSIS — E784 Other hyperlipidemia: Secondary | ICD-10-CM | POA: Diagnosis not present

## 2017-03-13 DIAGNOSIS — E119 Type 2 diabetes mellitus without complications: Secondary | ICD-10-CM | POA: Diagnosis not present

## 2017-03-13 DIAGNOSIS — Z0000 Encounter for general adult medical examination without abnormal findings: Secondary | ICD-10-CM | POA: Diagnosis not present

## 2017-03-13 DIAGNOSIS — R1013 Epigastric pain: Secondary | ICD-10-CM | POA: Diagnosis not present

## 2017-03-13 DIAGNOSIS — E538 Deficiency of other specified B group vitamins: Secondary | ICD-10-CM | POA: Diagnosis not present

## 2017-03-13 DIAGNOSIS — Z89611 Acquired absence of right leg above knee: Secondary | ICD-10-CM | POA: Diagnosis not present

## 2017-03-23 ENCOUNTER — Inpatient Hospital Stay (HOSPITAL_COMMUNITY)
Admission: EM | Admit: 2017-03-23 | Discharge: 2017-03-30 | DRG: 281 | Disposition: A | Discharge status: Home / Self Care | Payer: Medicare Other | Attending: Internal Medicine | Admitting: Internal Medicine

## 2017-03-23 ENCOUNTER — Emergency Department (HOSPITAL_COMMUNITY): Payer: Medicare Other

## 2017-03-23 DIAGNOSIS — I252 Old myocardial infarction: Secondary | ICD-10-CM

## 2017-03-23 DIAGNOSIS — Z9119 Patient's noncompliance with other medical treatment and regimen: Secondary | ICD-10-CM

## 2017-03-23 DIAGNOSIS — E1165 Type 2 diabetes mellitus with hyperglycemia: Secondary | ICD-10-CM

## 2017-03-23 DIAGNOSIS — Z7982 Long term (current) use of aspirin: Secondary | ICD-10-CM

## 2017-03-23 DIAGNOSIS — I5022 Chronic systolic (congestive) heart failure: Secondary | ICD-10-CM | POA: Diagnosis present

## 2017-03-23 DIAGNOSIS — N189 Chronic kidney disease, unspecified: Secondary | ICD-10-CM | POA: Diagnosis present

## 2017-03-23 DIAGNOSIS — N184 Chronic kidney disease, stage 4 (severe): Secondary | ICD-10-CM | POA: Diagnosis present

## 2017-03-23 DIAGNOSIS — Z803 Family history of malignant neoplasm of breast: Secondary | ICD-10-CM

## 2017-03-23 DIAGNOSIS — K219 Gastro-esophageal reflux disease without esophagitis: Secondary | ICD-10-CM | POA: Diagnosis present

## 2017-03-23 DIAGNOSIS — N179 Acute kidney failure, unspecified: Secondary | ICD-10-CM | POA: Diagnosis present

## 2017-03-23 DIAGNOSIS — Z87891 Personal history of nicotine dependence: Secondary | ICD-10-CM

## 2017-03-23 DIAGNOSIS — I130 Hypertensive heart and chronic kidney disease with heart failure and stage 1 through stage 4 chronic kidney disease, or unspecified chronic kidney disease: Secondary | ICD-10-CM | POA: Diagnosis present

## 2017-03-23 DIAGNOSIS — Z833 Family history of diabetes mellitus: Secondary | ICD-10-CM

## 2017-03-23 DIAGNOSIS — D509 Iron deficiency anemia, unspecified: Secondary | ICD-10-CM | POA: Diagnosis present

## 2017-03-23 DIAGNOSIS — D649 Anemia, unspecified: Secondary | ICD-10-CM | POA: Diagnosis present

## 2017-03-23 DIAGNOSIS — E1122 Type 2 diabetes mellitus with diabetic chronic kidney disease: Secondary | ICD-10-CM | POA: Diagnosis present

## 2017-03-23 DIAGNOSIS — E1151 Type 2 diabetes mellitus with diabetic peripheral angiopathy without gangrene: Secondary | ICD-10-CM | POA: Diagnosis present

## 2017-03-23 DIAGNOSIS — E785 Hyperlipidemia, unspecified: Secondary | ICD-10-CM | POA: Diagnosis present

## 2017-03-23 DIAGNOSIS — Z888 Allergy status to other drugs, medicaments and biological substances status: Secondary | ICD-10-CM

## 2017-03-23 DIAGNOSIS — Z8673 Personal history of transient ischemic attack (TIA), and cerebral infarction without residual deficits: Secondary | ICD-10-CM

## 2017-03-23 DIAGNOSIS — R079 Chest pain, unspecified: Secondary | ICD-10-CM | POA: Diagnosis not present

## 2017-03-23 DIAGNOSIS — I255 Ischemic cardiomyopathy: Secondary | ICD-10-CM | POA: Diagnosis present

## 2017-03-23 DIAGNOSIS — I214 Non-ST elevation (NSTEMI) myocardial infarction: Principal | ICD-10-CM | POA: Diagnosis present

## 2017-03-23 DIAGNOSIS — Z794 Long term (current) use of insulin: Secondary | ICD-10-CM | POA: Diagnosis not present

## 2017-03-23 DIAGNOSIS — I739 Peripheral vascular disease, unspecified: Secondary | ICD-10-CM | POA: Diagnosis present

## 2017-03-23 DIAGNOSIS — Z79899 Other long term (current) drug therapy: Secondary | ICD-10-CM

## 2017-03-23 DIAGNOSIS — D72829 Elevated white blood cell count, unspecified: Secondary | ICD-10-CM | POA: Diagnosis present

## 2017-03-23 DIAGNOSIS — I10 Essential (primary) hypertension: Secondary | ICD-10-CM | POA: Diagnosis present

## 2017-03-23 DIAGNOSIS — Z89611 Acquired absence of right leg above knee: Secondary | ICD-10-CM

## 2017-03-23 DIAGNOSIS — I5042 Chronic combined systolic (congestive) and diastolic (congestive) heart failure: Secondary | ICD-10-CM | POA: Diagnosis present

## 2017-03-23 DIAGNOSIS — IMO0001 Reserved for inherently not codable concepts without codable children: Secondary | ICD-10-CM | POA: Diagnosis present

## 2017-03-23 DIAGNOSIS — I2510 Atherosclerotic heart disease of native coronary artery without angina pectoris: Secondary | ICD-10-CM | POA: Diagnosis present

## 2017-03-23 HISTORY — DX: Chronic kidney disease, stage 4 (severe): N18.4

## 2017-03-23 HISTORY — DX: Anemia, unspecified: D64.9

## 2017-03-23 HISTORY — DX: Atherosclerotic heart disease of native coronary artery without angina pectoris: I25.10

## 2017-03-23 MED ORDER — NITROGLYCERIN 2 % TD OINT
1.0000 [in_us] | TOPICAL_OINTMENT | Freq: Once | TRANSDERMAL | Status: AC
  Administered 2017-03-24: 1 [in_us] via TOPICAL
  Filled 2017-03-23: qty 1

## 2017-03-23 NOTE — ED Provider Notes (Signed)
[facility] DEPT Provider Note   CSN: [id number] Arrival date & time: 03/23/17  2333 By signing my name below, I, Georgette Shell, attest that this documentation has been prepared under the direction and in the presence of Rolland Porter, MD. Electronically Signed: Georgette Shell, ED Scribe. 03/23/17. 11:47 PM.  Time seen 23:30 PM  History   Chief Complaint Chief Complaint  Patient presents with  . Chest Pain    HPI The history is provided by the patient. No language interpreter was used.   HPI Comments: Jenna Brown is a 66 y.o. female with h/o anxiety, GERD, CKD, HTN, HLD, DM, MI, CHF, TIA, and CVA, who presents to the Emergency Department complaining of nonexertional intermittent chest pain beginning yesterday morning. Pt reports chest pain began yesterday and remained intermittent lasting 20-30 minutes until tonight at 7 pm when it became constant. Pt states pain is pressure-like in quality and is in her left chest. States her pain was about a 9/10 at it's worse. Pt also has associated nausea, diaphoresis, and x1 episode of vomiting. She states she has had one episode of shortness of breath around noon that has since resolved.  Denies any modifying factors. Pt has taken 4 NTG throughout the day today with no relief and was given 3 NTG by EMS en route and lessened her pain to a 2/10. Per pt, her symptoms at this time feel similar to her prior MI in 2013.She reports having chest pain requiring using NTG at least several days a week. Usually only 1 NTG will resolve the pain.  Pt has no h/o stents or bypass surgery. She was admitted in April 2018 for a CHF exacerbation. Pt is not a smoker and does not drink alcohol. Denies fever, chills, or any other associated symptoms.Pt was instructed to chew 4 baby ASA by 911 which she did just prior to coming to the ED.  PCP: [name], MD Cardiologist: Glori Bickers, MD  Past Medical History:  Diagnosis Date  . Anxiety    takes Xanax prn  .  Arthritis   . GERD (gastroesophageal reflux disease)   . H/O hiatal hernia   . Headache(784.0)    when b/p is elevated  . Hemorrhoids   . Hyperlipidemia    takes Simvastatin daily  . Hypertension    takes Amlodipine/HCTZ/Losartan daily  . Migraines   . Myocardial infarction ([place]) 1990's  . Peripheral vascular disease ([place])   . PONV (postoperative nausea and vomiting)   . Stroke [facility])    TIA history  . Type II diabetes mellitus (HCC)    takes Glucovance and Januvia daily  . Vertigo    takes Ativert prn    Patient Active Problem List   Diagnosis Date Noted  . Cardiac mass   . Chronic kidney disease (CKD), stage IV (severe) ([place]) 12/26/2016  . CKD (chronic kidney disease), stage IV ([place])   . Hypertensive emergency   . CAD in native artery   . Cardiomyopathy, ischemic 05/14/2015  . TIA (transient ischemic attack) 05/14/2015  . PAD (peripheral artery disease) ([place]) 05/14/2015  . Chronic systolic heart failure ([place]) 05/14/2015  . Coronary artery disease due to lipid rich plaque   . Cardiac enzymes elevated   . NSTEMI (non-ST elevated myocardial infarction) ([place])   . Essential hypertension   . HLD (hyperlipidemia)   . Diabetes mellitus, insulin dependent (IDDM), uncontrolled ([place])   . Acute kidney injury ([place])   . Anemia 05/10/2012  . Chronic total occlusion of artery of  the extremities ([place]) 05/02/2012  . Stroke ([place])   . Peripheral vascular disease [facility])     Past Surgical History:  Procedure Laterality Date  . ABDOMINAL AORTAGRAM N/A 02/29/2012   Procedure: ABDOMINAL Maxcine Ham;  Surgeon: [name], MD;  Location: [facility] CATH LAB;  Service: Cardiovascular;  Laterality: N/A;  . AMPUTATION  05/10/2012   Procedure: AMPUTATION ABOVE KNEE;  Surgeon: [name], MD;  Location: [facility] OR;  Service: Vascular;  Laterality: Right;   open right groin wound noted  . aortogram  02/2012  . CARDIAC CATHETERIZATION N/A 04/13/2015   Procedure: Right/Left Heart Cath and Coronary  Angiography;  Surgeon: [name], MD;  Location: [facility] CV LAB;  Service: Cardiovascular;  Laterality: N/A;  . CLEFT PALATE REPAIR     several  . COLONOSCOPY    . DILATION AND CURETTAGE OF UTERUS    . FEMORAL-POPLITEAL BYPASS GRAFT  04/05/2012   Procedure: BYPASS GRAFT FEMORAL-POPLITEAL ARTERY;  Surgeon: [name], MD;  Location: [facility] [place];  Service: Vascular;  Laterality: Right;  REVISION  . FEMORAL-TIBIAL BYPASS GRAFT  03/29/2012   Procedure: BYPASS GRAFT FEMORAL-TIBIAL ARTERY;  Surgeon: [name], MD;  Location: [facility] OR;  Service: Vascular;  Laterality: Right;  Right femoral to Posterior Tibialis with composite graft of 47mm x 80 cm ringed gortex graft and saphenous vein  ,intraoperative arteriogram  . FEMOROPOPLITEAL THROMBECTOMY / EMBOLECTOMY  05/09/2012  . MYOMECTOMY    . PR VEIN BYPASS GRAFT,AORTO-FEM-POP  05/27/11   Right SFA-Below knee Pop BP  . TEE WITHOUT CARDIOVERSION N/A 01/03/2017   Procedure: TRANSESOPHAGEAL ECHOCARDIOGRAM (TEE);  Surgeon: Skeet Latch, MD;  Location: [facility] ENDOSCOPY;  Service: Cardiovascular;  Laterality: N/A;  . TONSILLECTOMY    . TUBAL LIGATION  ~ 1990    OB History    No data available       Home Medications    Prior to Admission medications   Medication Sig Start Date End Date Taking? Authorizing Provider  acetaminophen (TYLENOL) 650 MG CR tablet Take 650 mg by mouth every 8 (eight) hours as needed for pain.    Yes [provider]  ALPRAZolam (XANAX) 0.25 MG tablet Take 1 tablet (0.25 mg total) by mouth 3 (three) times daily as needed for anxiety. 01/03/17  Yes Carlota Raspberry, Tiffany, PA-C  aspirin 81 MG chewable tablet Chew 81 mg by mouth daily.   Yes [provider]  carvedilol (COREG) 12.5 MG tablet Take 1 tablet (12.5 mg total) by mouth 2 (two) times daily with a meal. 01/03/17  Yes Carlota Raspberry, Tiffany, PA-C  furosemide (LASIX) 40 MG tablet Take 1 tablet (40 mg total) by mouth daily. 05/14/15  Yes Clegg, Amy D, NP  hydrALAZINE  (APRESOLINE) 25 MG tablet Take 1 tablet (25 mg total) by mouth every 8 (eight) hours. 01/03/17  Yes Carlota Raspberry, Tiffany, PA-C  insulin aspart (NOVOLOG) 100 UNIT/ML injection Inject 5 Units into the skin 3 (three) times daily before meals. 01/03/17  Yes Carlota Raspberry, Tiffany, PA-C  insulin glargine (LANTUS) 100 UNIT/ML injection Inject 0.25 mLs (25 Units total) into the skin 2 (two) times daily. Patient taking differently: Inject 25 Units into the skin daily.  01/03/17  Yes Carlota Raspberry, Tiffany, PA-C  Insulin Pen Needle (AURORA PEN NEEDLES) 29G X 12MM MISC 5 Units by Does not apply route 3 (three) times daily before meals. 05/16/15  Yes Ricard Dillon, MD  isosorbide mononitrate (IMDUR) 30 MG 24 hr tablet take 1 tablet by mouth once daily 04/18/16  Yes  Bensimhon, Shaune Pascal, MD  losartan (COZAAR) 100 MG tablet Take 100 mg by mouth daily.   Yes [provider]  nitroGLYCERIN (NITROSTAT) 0.4 MG SL tablet Place 1 tablet (0.4 mg total) under the tongue every 5 (five) minutes x 3 doses as needed for chest pain. 08/13/15  Yes Rogelia Mire, NP  pravastatin (PRAVACHOL) 40 MG tablet Take 40 mg by mouth daily.   Yes [provider]  ticagrelor (BRILINTA) 90 MG TABS tablet Take 1 tablet (90 mg total) by mouth 2 (two) times daily. Patient taking differently: Take 90 mg by mouth daily.  01/03/17  Yes Delos Haring, PA-C    Family History Family History  Problem Relation Age of Onset  . Cancer Mother        breast  . Diabetes Father     Social History Social History  Substance Use Topics  . Smoking status: Former Smoker    Packs/day: 0.25    Years: 40.00    Types: Cigarettes    Quit date: 02/27/2012  . Smokeless tobacco: Never Used  . Alcohol use No  lives with mother and sister.    Allergies   Plavix [clopidogrel bisulfate]; Codeine; Tylenol with codeine #3 [acetaminophen-codeine]; and Tylenol [acetaminophen]   Review of Systems Review of Systems  Constitutional: Positive for  diaphoresis. Negative for chills and fever.  Respiratory: Negative for shortness of breath.   Gastrointestinal: Positive for nausea and vomiting.  All other systems reviewed and are negative.  Physical Exam Updated Vital Signs BP 136/61 (BP Location: Right Arm)   Pulse 96   Temp 98.5 F (36.9 C) (Oral)   Resp (!) 26   SpO2 98%   Vital signs normal    Physical Exam  Constitutional: She is oriented to person, place, and time. She appears well-developed and well-nourished.  Non-toxic appearance. She does not appear ill. No distress.  HENT:  Head: Normocephalic and atraumatic.  Right Ear: External ear normal.  Left Ear: External ear normal.  Nose: Nose normal. No mucosal edema or rhinorrhea.  Mouth/Throat: Oropharynx is clear and moist and mucous membranes are normal. No dental abscesses or uvula swelling.  Eyes: Conjunctivae and EOM are normal. Pupils are equal, round, and reactive to light.  Neck: Normal range of motion and full passive range of motion without pain. Neck supple.  Cardiovascular: Normal rate, regular rhythm and normal heart sounds.  Exam reveals no gallop and no friction rub.   No murmur heard. Pulmonary/Chest: Effort normal and breath sounds normal. No respiratory distress. She has no wheezes. She has no rhonchi. She has no rales. She exhibits no tenderness and no crepitus.    Area of pain noted  Abdominal: Soft. Normal appearance and bowel sounds are normal. She exhibits no distension. There is no tenderness. There is no rebound and no guarding.  Musculoskeletal: Normal range of motion. She exhibits no edema or tenderness.  High right AKA.  Neurological: She is alert and oriented to person, place, and time. She has normal strength. No cranial nerve deficit.  Skin: Skin is warm, dry and intact. No rash noted. No erythema. No pallor.  Psychiatric: She has a normal mood and affect. Her speech is normal and behavior is normal. Her mood appears not anxious.  Nursing  note and vitals reviewed.    ED Treatments / Results    Labs (all labs ordered are listed, but only abnormal results are displayed) Results for orders placed or performed during the hospital encounter of 03/23/17  Comprehensive metabolic panel  Result Value Ref Range   Sodium 138 135 - 145 mmol/L   Potassium 4.7 3.5 - 5.1 mmol/L   Chloride 109 101 - 111 mmol/L   CO2 22 22 - 32 mmol/L   Glucose, Bld 207 (H) 65 - 99 mg/dL   BUN 42 (H) 6 - 20 mg/dL   Creatinine, Ser 2.56 (H) 0.44 - 1.00 mg/dL   Calcium 8.7 (L) 8.9 - 10.3 mg/dL   Total Protein 6.3 (L) 6.5 - 8.1 g/dL   Albumin 3.1 (L) 3.5 - 5.0 g/dL   AST 18 15 - 41 U/L   ALT 14 14 - 54 U/L   Alkaline Phosphatase 91 38 - 126 U/L   Total Bilirubin 0.5 0.3 - 1.2 mg/dL   GFR calc non Af Amer 19 (L) >60 mL/min   GFR calc Af Amer 22 (L) >60 mL/min   Anion gap 7 5 - 15  CBC with Differential  Result Value Ref Range   WBC 12.0 (H) 4.0 - 10.5 K/uL   RBC 3.48 (L) 3.87 - 5.11 MIL/uL   Hemoglobin 8.8 (L) 12.0 - 15.0 g/dL   HCT 28.8 (L) 36.0 - 46.0 %   MCV 82.8 78.0 - 100.0 fL   MCH 25.3 (L) 26.0 - 34.0 pg   MCHC 30.6 30.0 - 36.0 g/dL   RDW 13.2 11.5 - 15.5 %   Platelets 316 150 - 400 K/uL   Neutrophils Relative % 81 %   Neutro Abs 9.7 (H) 1.7 - 7.7 K/uL   Lymphocytes Relative 14 %   Lymphs Abs 1.7 0.7 - 4.0 K/uL   Monocytes Relative 5 %   Monocytes Absolute 0.6 0.1 - 1.0 K/uL   Eosinophils Relative 0 %   Eosinophils Absolute 0.0 0.0 - 0.7 K/uL   Basophils Relative 0 %   Basophils Absolute 0.0 0.0 - 0.1 K/uL  Troponin I  Result Value Ref Range   Troponin I 0.36 (HH) <0.03 ng/mL  Troponin I  Result Value Ref Range   Troponin I 1.44 (HH) <0.03 ng/mL   Laboratory interpretation all normal except Stable anemia, initial positive troponin with increasing delta troponin, renal insufficiency, hyperglycemia, leukocytosis    EKG  EKG Interpretation  Date/Time:  Thursday March 23 2017 23:38:26 EDT Ventricular Rate:  99 PR  Interval:    QRS Duration: 95 QT Interval:  340 QTC Calculation: 437 R Axis:   40 Text Interpretation:  Sinus rhythm Poor R wave progression T-wave inversion in limb lateral leads Since last tracing 31 Dec 2016 T wave inversion no longer evident in chest lateral leads Confirmed by Rolland Porter ([id number]) on 03/23/2017 11:59:09 PM       Radiology Dg Chest Port 1 View  Result Date: 03/24/2017 CLINICAL DATA:  Acute onset of generalized chest pain. Initial encounter. EXAM: PORTABLE CHEST 1 VIEW COMPARISON:  Chest radiograph performed 12/25/2016 FINDINGS: The lungs are hypoexpanded but appear grossly clear. No pleural effusion or pneumothorax is seen. The cardiomediastinal silhouette is borderline normal in size. No acute osseous abnormalities are identified. IMPRESSION: Lungs hypoexpanded but grossly clear. Electronically Signed   By: Garald Balding M.D.   On: 03/24/2017 00:13    Procedures Procedures (including critical care time)  CRITICAL CARE Performed by: Darrien Laakso L Emeril Stille Total critical care time: 37 minutes Critical care time was exclusive of separately billable procedures and treating other patients. Critical care was necessary to treat or prevent imminent or life-threatening deterioration. Critical care was time spent personally by me  on the following activities: development of treatment plan with patient and/or surrogate as well as nursing, discussions with consultants, evaluation of patient's response to treatment, examination of patient, obtaining history from patient or surrogate, ordering and performing treatments and interventions, ordering and review of laboratory studies, ordering and review of radiographic studies, pulse oximetry and re-evaluation of patient's condition.   Medications Ordered in ED Medications  heparin injection 4,000 Units (not administered)  heparin ADULT infusion 100 units/mL (25000 units/260mL sodium chloride 0.45%) (not administered)  nitroGLYCERIN (NITROGLYN) 2 %  ointment 1 inch (1 inch Topical Given 03/24/17 0005)     Initial Impression / Assessment and Plan / ED Course  I have reviewed the triage vital signs and the nursing notes.  Pertinent labs & imaging results that were available during my care of the patient were reviewed by me and considered in my medical decision making (see chart for details).  DIAGNOSTIC STUDIES: Oxygen Saturation is 98% on RA, normal by my interpretation.   COORDINATION OF CARE: 11:46 PM-Discussed next steps with pt. Pt verbalized understanding and is agreeable with the plan. Pt was given NTG paste, laboratory testing was ordered. She has already had her baby aspirin.  Patient's initial troponin was positive, however she did not have a normal troponin since her last admission in April, a delta troponin was ordered.   Patient's blood pressure remained in the normal range during her ED visit.  Patient was last admitted in April with a NSTEMI.  Recent cardiology note from April 65 y.o.femalewith multivessel-CAD deemed to be a poor candidate for surgical or percutaneous revascularization,systolic HF with improved LVEF(LVEF 20% in 2016, now 45-50%),IDDM, HTN, dyslipidemia, prior CVA, and s/p right AKA who presented to [facility] on 12/25/16 with chest pain and found to have small NSTEMI in the setting of SBP>285mmHg.  She was admitted in 2016 for NSTEMI. She was determined to not be a surgical/PCI candidate due to chronic kidney disease and not being a CABG candidate for chronic medical problems. She was started on DAPT with ASA and Brilinta but then was lost to follow-up until this admission. There was concern for medical non compliance and patient unclear on what medications she was taking.   3:12 AM Discussed with pt that she will be admitted due to her troponin being elevated and is increasing. Pt states that her pain is now a 0/10.  03:30 AM Dr Overton Mam, cardiology, will admit, wants her to be started on heparin.   Final  Clinical Impressions(s) / ED Diagnoses   Final diagnoses:  NSTEMI (non-ST elevated myocardial infarction) [facility])    Plan admission  Rolland Porter, MD, FACEP   I personally performed the services described in this documentation, which was scribed in my presence. The recorded information has been reviewed and considered.  Rolland Porter, MD, Barbette Or, MD 03/24/17 [phone]

## 2017-03-23 NOTE — ED Triage Notes (Signed)
Per [facility],  Pt from home, complains of CP starting 7/4, lasting all night and intermittently throughout the day. Pt was admitted in April for CHF exacerbation. Pt took 4 nitro throughout the day today with no relief. Pt received 3 nitro en route and decreased her pain from 8/10 to 2/10. Pt lung sounds diminished. 12 lead unremarkable. Pt had 1 episode vomiting today, coughing, pt has some edema to L foot.

## 2017-03-24 ENCOUNTER — Inpatient Hospital Stay (HOSPITAL_COMMUNITY): Payer: Medicare Other

## 2017-03-24 ENCOUNTER — Encounter (HOSPITAL_COMMUNITY): Payer: Self-pay | Admitting: General Practice

## 2017-03-24 DIAGNOSIS — D508 Other iron deficiency anemias: Secondary | ICD-10-CM | POA: Diagnosis not present

## 2017-03-24 DIAGNOSIS — I2510 Atherosclerotic heart disease of native coronary artery without angina pectoris: Secondary | ICD-10-CM

## 2017-03-24 DIAGNOSIS — E1065 Type 1 diabetes mellitus with hyperglycemia: Secondary | ICD-10-CM

## 2017-03-24 DIAGNOSIS — D631 Anemia in chronic kidney disease: Secondary | ICD-10-CM

## 2017-03-24 DIAGNOSIS — E785 Hyperlipidemia, unspecified: Secondary | ICD-10-CM | POA: Diagnosis present

## 2017-03-24 DIAGNOSIS — I213 ST elevation (STEMI) myocardial infarction of unspecified site: Secondary | ICD-10-CM | POA: Diagnosis not present

## 2017-03-24 DIAGNOSIS — Z89611 Acquired absence of right leg above knee: Secondary | ICD-10-CM | POA: Diagnosis not present

## 2017-03-24 DIAGNOSIS — Z79899 Other long term (current) drug therapy: Secondary | ICD-10-CM | POA: Diagnosis not present

## 2017-03-24 DIAGNOSIS — E1151 Type 2 diabetes mellitus with diabetic peripheral angiopathy without gangrene: Secondary | ICD-10-CM | POA: Diagnosis not present

## 2017-03-24 DIAGNOSIS — N184 Chronic kidney disease, stage 4 (severe): Secondary | ICD-10-CM | POA: Diagnosis not present

## 2017-03-24 DIAGNOSIS — I219 Acute myocardial infarction, unspecified: Secondary | ICD-10-CM

## 2017-03-24 DIAGNOSIS — I214 Non-ST elevation (NSTEMI) myocardial infarction: Principal | ICD-10-CM

## 2017-03-24 DIAGNOSIS — I255 Ischemic cardiomyopathy: Secondary | ICD-10-CM | POA: Diagnosis not present

## 2017-03-24 DIAGNOSIS — E1029 Type 1 diabetes mellitus with other diabetic kidney complication: Secondary | ICD-10-CM

## 2017-03-24 DIAGNOSIS — Z888 Allergy status to other drugs, medicaments and biological substances status: Secondary | ICD-10-CM | POA: Diagnosis not present

## 2017-03-24 DIAGNOSIS — E1122 Type 2 diabetes mellitus with diabetic chronic kidney disease: Secondary | ICD-10-CM | POA: Diagnosis not present

## 2017-03-24 DIAGNOSIS — Z87891 Personal history of nicotine dependence: Secondary | ICD-10-CM | POA: Diagnosis not present

## 2017-03-24 DIAGNOSIS — E782 Mixed hyperlipidemia: Secondary | ICD-10-CM | POA: Diagnosis not present

## 2017-03-24 DIAGNOSIS — I129 Hypertensive chronic kidney disease with stage 1 through stage 4 chronic kidney disease, or unspecified chronic kidney disease: Secondary | ICD-10-CM | POA: Diagnosis not present

## 2017-03-24 DIAGNOSIS — Z803 Family history of malignant neoplasm of breast: Secondary | ICD-10-CM | POA: Diagnosis not present

## 2017-03-24 DIAGNOSIS — D72829 Elevated white blood cell count, unspecified: Secondary | ICD-10-CM | POA: Diagnosis present

## 2017-03-24 DIAGNOSIS — I5022 Chronic systolic (congestive) heart failure: Secondary | ICD-10-CM | POA: Diagnosis not present

## 2017-03-24 DIAGNOSIS — K219 Gastro-esophageal reflux disease without esophagitis: Secondary | ICD-10-CM | POA: Diagnosis not present

## 2017-03-24 DIAGNOSIS — Z833 Family history of diabetes mellitus: Secondary | ICD-10-CM | POA: Diagnosis not present

## 2017-03-24 DIAGNOSIS — I25110 Atherosclerotic heart disease of native coronary artery with unstable angina pectoris: Secondary | ICD-10-CM | POA: Diagnosis not present

## 2017-03-24 DIAGNOSIS — Z7982 Long term (current) use of aspirin: Secondary | ICD-10-CM | POA: Diagnosis not present

## 2017-03-24 DIAGNOSIS — D509 Iron deficiency anemia, unspecified: Secondary | ICD-10-CM | POA: Diagnosis present

## 2017-03-24 DIAGNOSIS — Z9119 Patient's noncompliance with other medical treatment and regimen: Secondary | ICD-10-CM | POA: Diagnosis not present

## 2017-03-24 DIAGNOSIS — N179 Acute kidney failure, unspecified: Secondary | ICD-10-CM | POA: Diagnosis not present

## 2017-03-24 DIAGNOSIS — I10 Essential (primary) hypertension: Secondary | ICD-10-CM | POA: Diagnosis not present

## 2017-03-24 DIAGNOSIS — Z794 Long term (current) use of insulin: Secondary | ICD-10-CM | POA: Diagnosis not present

## 2017-03-24 DIAGNOSIS — I739 Peripheral vascular disease, unspecified: Secondary | ICD-10-CM | POA: Diagnosis not present

## 2017-03-24 DIAGNOSIS — Z8673 Personal history of transient ischemic attack (TIA), and cerebral infarction without residual deficits: Secondary | ICD-10-CM | POA: Diagnosis not present

## 2017-03-24 DIAGNOSIS — I252 Old myocardial infarction: Secondary | ICD-10-CM | POA: Diagnosis not present

## 2017-03-24 DIAGNOSIS — I130 Hypertensive heart and chronic kidney disease with heart failure and stage 1 through stage 4 chronic kidney disease, or unspecified chronic kidney disease: Secondary | ICD-10-CM | POA: Diagnosis not present

## 2017-03-24 LAB — ECHOCARDIOGRAM LIMITED
Area-P 1/2: 3.49 cm2
EWDT: 215 ms
FS: 26 % — AB (ref 28–44)
HEIGHTINCHES: 64 in
IVS/LV PW RATIO, ED: 1.22
LA ID, A-P, ES: 38 mm
LA diam index: 2.09 cm/m2
LEFT ATRIUM END SYS DIAM: 38 mm
LVOT area: 2.54 cm2
LVOTD: 18 mm
MV Dec: 215
MV Peak grad: 3 mmHg
MV pk A vel: 115 m/s
MV pk E vel: 84.4 m/s
P 1/2 time: 63 ms
PW: 12.9 mm — AB (ref 0.6–1.1)
WEIGHTICAEL: 2534.4 [oz_av]

## 2017-03-24 LAB — CBC
HEMATOCRIT: 28.5 % — AB (ref 36.0–46.0)
Hemoglobin: 8.5 g/dL — ABNORMAL LOW (ref 12.0–15.0)
MCH: 24.7 pg — ABNORMAL LOW (ref 26.0–34.0)
MCHC: 29.8 g/dL — ABNORMAL LOW (ref 30.0–36.0)
MCV: 82.8 fL (ref 78.0–100.0)
PLATELETS: 310 10*3/uL (ref 150–400)
RBC: 3.44 MIL/uL — ABNORMAL LOW (ref 3.87–5.11)
RDW: 13.3 % (ref 11.5–15.5)
WBC: 10.9 10*3/uL — AB (ref 4.0–10.5)

## 2017-03-24 LAB — COMPREHENSIVE METABOLIC PANEL
ALBUMIN: 3.1 g/dL — AB (ref 3.5–5.0)
ALK PHOS: 91 U/L (ref 38–126)
ALT: 14 U/L (ref 14–54)
ANION GAP: 7 (ref 5–15)
AST: 18 U/L (ref 15–41)
BUN: 42 mg/dL — AB (ref 6–20)
CALCIUM: 8.7 mg/dL — AB (ref 8.9–10.3)
CO2: 22 mmol/L (ref 22–32)
Chloride: 109 mmol/L (ref 101–111)
Creatinine, Ser: 2.56 mg/dL — ABNORMAL HIGH (ref 0.44–1.00)
GFR calc Af Amer: 22 mL/min — ABNORMAL LOW (ref >60)
GFR calc non Af Amer: 19 mL/min — ABNORMAL LOW (ref >60)
GLUCOSE: 207 mg/dL — AB (ref 65–99)
Potassium: 4.7 mmol/L (ref 3.5–5.1)
SODIUM: 138 mmol/L (ref 135–145)
Total Bilirubin: 0.5 mg/dL (ref 0.3–1.2)
Total Protein: 6.3 g/dL — ABNORMAL LOW (ref 6.5–8.1)

## 2017-03-24 LAB — CBC WITH DIFFERENTIAL/PLATELET
BASOS PCT: 0 %
Basophils Absolute: 0 10*3/uL (ref 0.0–0.1)
EOS PCT: 0 %
Eosinophils Absolute: 0 10*3/uL (ref 0.0–0.7)
HEMATOCRIT: 28.8 % — AB (ref 36.0–46.0)
Hemoglobin: 8.8 g/dL — ABNORMAL LOW (ref 12.0–15.0)
Lymphocytes Relative: 14 %
Lymphs Abs: 1.7 10*3/uL (ref 0.7–4.0)
MCH: 25.3 pg — ABNORMAL LOW (ref 26.0–34.0)
MCHC: 30.6 g/dL (ref 30.0–36.0)
MCV: 82.8 fL (ref 78.0–100.0)
MONO ABS: 0.6 10*3/uL (ref 0.1–1.0)
MONOS PCT: 5 %
NEUTROS ABS: 9.7 10*3/uL — AB (ref 1.7–7.7)
Neutrophils Relative %: 81 %
Platelets: 316 10*3/uL (ref 150–400)
RBC: 3.48 MIL/uL — ABNORMAL LOW (ref 3.87–5.11)
RDW: 13.2 % (ref 11.5–15.5)
WBC: 12 10*3/uL — ABNORMAL HIGH (ref 4.0–10.5)

## 2017-03-24 LAB — BASIC METABOLIC PANEL
Anion gap: 6 (ref 5–15)
BUN: 41 mg/dL — AB (ref 6–20)
CALCIUM: 8.7 mg/dL — AB (ref 8.9–10.3)
CO2: 24 mmol/L (ref 22–32)
CREATININE: 2.61 mg/dL — AB (ref 0.44–1.00)
Chloride: 110 mmol/L (ref 101–111)
GFR calc Af Amer: 21 mL/min — ABNORMAL LOW (ref >60)
GFR, EST NON AFRICAN AMERICAN: 18 mL/min — AB (ref >60)
Glucose, Bld: 89 mg/dL (ref 65–99)
POTASSIUM: 4.3 mmol/L (ref 3.5–5.1)
SODIUM: 140 mmol/L (ref 135–145)

## 2017-03-24 LAB — TROPONIN I
TROPONIN I: 1.44 ng/mL — AB (ref <0.03)
TROPONIN I: 2.69 ng/mL — AB (ref <0.03)
Troponin I: 0.36 ng/mL (ref <0.03)
Troponin I: 3.86 ng/mL (ref <0.03)
Troponin I: 3.92 ng/mL (ref <0.03)

## 2017-03-24 LAB — GLUCOSE, CAPILLARY
GLUCOSE-CAPILLARY: 78 mg/dL (ref 65–99)
GLUCOSE-CAPILLARY: 84 mg/dL (ref 65–99)
Glucose-Capillary: 178 mg/dL — ABNORMAL HIGH (ref 65–99)
Glucose-Capillary: 174 mg/dL — ABNORMAL HIGH (ref 65–99)

## 2017-03-24 LAB — HEPARIN LEVEL (UNFRACTIONATED)
HEPARIN UNFRACTIONATED: 0.1 [IU]/mL — AB (ref 0.30–0.70)
Heparin Unfractionated: 0.3 IU/mL (ref 0.30–0.70)

## 2017-03-24 LAB — MRSA PCR SCREENING: MRSA by PCR: NEGATIVE

## 2017-03-24 MED ORDER — CARVEDILOL 12.5 MG PO TABS
12.5000 mg | ORAL_TABLET | Freq: Two times a day (BID) | ORAL | Status: DC
  Administered 2017-03-24 – 2017-03-30 (×13): 12.5 mg via ORAL
  Filled 2017-03-24 (×12): qty 1

## 2017-03-24 MED ORDER — SODIUM CHLORIDE 0.9 % IV SOLN
INTRAVENOUS | Status: AC
  Administered 2017-03-24: 13:00:00 via INTRAVENOUS

## 2017-03-24 MED ORDER — NITROGLYCERIN 0.4 MG SL SUBL
0.4000 mg | SUBLINGUAL_TABLET | SUBLINGUAL | Status: DC | PRN
  Administered 2017-03-27 – 2017-03-30 (×2): 0.4 mg via SUBLINGUAL
  Filled 2017-03-24 (×3): qty 1

## 2017-03-24 MED ORDER — INSULIN ASPART 100 UNIT/ML ~~LOC~~ SOLN
5.0000 [IU] | Freq: Three times a day (TID) | SUBCUTANEOUS | Status: DC
  Administered 2017-03-24 – 2017-03-30 (×11): 5 [IU] via SUBCUTANEOUS

## 2017-03-24 MED ORDER — TICAGRELOR 90 MG PO TABS
90.0000 mg | ORAL_TABLET | Freq: Two times a day (BID) | ORAL | Status: DC
  Administered 2017-03-24 – 2017-03-30 (×13): 90 mg via ORAL
  Filled 2017-03-24 (×14): qty 1

## 2017-03-24 MED ORDER — FUROSEMIDE 40 MG PO TABS
40.0000 mg | ORAL_TABLET | Freq: Every day | ORAL | Status: DC
  Administered 2017-03-24 – 2017-03-26 (×3): 40 mg via ORAL
  Filled 2017-03-24 (×3): qty 1

## 2017-03-24 MED ORDER — HEPARIN BOLUS VIA INFUSION
2000.0000 [IU] | Freq: Once | INTRAVENOUS | Status: AC
  Administered 2017-03-24: 2000 [IU] via INTRAVENOUS
  Filled 2017-03-24: qty 2000

## 2017-03-24 MED ORDER — ISOSORBIDE MONONITRATE ER 60 MG PO TB24
60.0000 mg | ORAL_TABLET | Freq: Every day | ORAL | Status: DC
  Administered 2017-03-24 – 2017-03-29 (×6): 60 mg via ORAL
  Filled 2017-03-24 (×6): qty 1

## 2017-03-24 MED ORDER — PRAVASTATIN SODIUM 40 MG PO TABS
40.0000 mg | ORAL_TABLET | Freq: Every day | ORAL | Status: DC
  Administered 2017-03-24 – 2017-03-30 (×7): 40 mg via ORAL
  Filled 2017-03-24 (×6): qty 1
  Filled 2017-03-24: qty 2

## 2017-03-24 MED ORDER — ASPIRIN 81 MG PO CHEW
81.0000 mg | CHEWABLE_TABLET | Freq: Every day | ORAL | Status: DC
  Administered 2017-03-24 – 2017-03-30 (×7): 81 mg via ORAL
  Filled 2017-03-24 (×7): qty 1

## 2017-03-24 MED ORDER — LOSARTAN POTASSIUM 50 MG PO TABS
100.0000 mg | ORAL_TABLET | Freq: Every day | ORAL | Status: DC
  Administered 2017-03-24: 100 mg via ORAL
  Filled 2017-03-24 (×2): qty 2

## 2017-03-24 MED ORDER — ALPRAZOLAM 0.25 MG PO TABS
0.2500 mg | ORAL_TABLET | Freq: Three times a day (TID) | ORAL | Status: DC | PRN
  Administered 2017-03-26 – 2017-03-28 (×2): 0.25 mg via ORAL
  Filled 2017-03-24 (×2): qty 1

## 2017-03-24 MED ORDER — HEPARIN SODIUM (PORCINE) 5000 UNIT/ML IJ SOLN
4000.0000 [IU] | Freq: Once | INTRAMUSCULAR | Status: AC
Start: 2017-03-24 — End: 2017-03-24
  Administered 2017-03-24: 4000 [IU] via INTRAVENOUS

## 2017-03-24 MED ORDER — HEPARIN (PORCINE) IN NACL 100-0.45 UNIT/ML-% IJ SOLN
1000.0000 [IU]/h | INTRAMUSCULAR | Status: DC
  Administered 2017-03-24: 750 [IU]/h via INTRAVENOUS
  Administered 2017-03-26 – 2017-03-27 (×2): 950 [IU]/h via INTRAVENOUS
  Administered 2017-03-28: 1000 [IU]/h via INTRAVENOUS
  Filled 2017-03-24 (×6): qty 250

## 2017-03-24 MED ORDER — ACETAMINOPHEN 325 MG PO TABS
650.0000 mg | ORAL_TABLET | Freq: Three times a day (TID) | ORAL | Status: DC
  Administered 2017-03-24 – 2017-03-30 (×18): 650 mg via ORAL
  Filled 2017-03-24 (×18): qty 2

## 2017-03-24 MED ORDER — HYDRALAZINE HCL 25 MG PO TABS
25.0000 mg | ORAL_TABLET | Freq: Three times a day (TID) | ORAL | Status: DC
  Administered 2017-03-24 – 2017-03-29 (×15): 25 mg via ORAL
  Filled 2017-03-24 (×15): qty 1

## 2017-03-24 MED ORDER — ONDANSETRON HCL 4 MG/2ML IJ SOLN: 4.0000 mg | Freq: Four times a day (QID) | INTRAMUSCULAR | Status: DC | PRN

## 2017-03-24 MED ORDER — INSULIN GLARGINE 100 UNIT/ML ~~LOC~~ SOLN
25.0000 [IU] | Freq: Every day | SUBCUTANEOUS | Status: DC
  Administered 2017-03-25 – 2017-03-30 (×6): 25 [IU] via SUBCUTANEOUS
  Filled 2017-03-24 (×7): qty 0.25

## 2017-03-24 NOTE — ED Notes (Signed)
Verified with [place] RN

## 2017-03-24 NOTE — Progress Notes (Signed)
  Echocardiogram 2D Echocardiogram has been performed.  Kaysin Brock G Quron Ruddy 03/24/2017, 11:33 AM

## 2017-03-24 NOTE — Progress Notes (Signed)
ANTICOAGULATION CONSULT NOTE - Initial Consult  Pharmacy Consult for Heparin  Indication: chest pain/ACS  Allergies  Allergen Reactions  . Plavix [Clopidogrel Bisulfate] Itching  . Codeine Other (See Comments)    "makes me feel strange"  . Tylenol With Codeine #3 [Acetaminophen-Codeine] Other (See Comments)    "doesn't make me feel right"  . Tylenol [Acetaminophen] Other (See Comments)    "Doesn't feel right"    Patient Measurements: Height: 5\' 4"  (162.6 cm) Weight: 159 lb (72.1 kg) IBW/kg (Calculated) : 54.7  Vital Signs: Temp: 98.5 F (36.9 C) (07/05 2347) Temp Source: Oral (07/05 2347) BP: 118/59 (07/06 0302) Pulse Rate: 87 (07/06 0302)  Labs:  Recent Labs  03/24/17 0009 03/24/17 0200  HGB 8.8*  --   HCT 28.8*  --   PLT 316  --   CREATININE 2.56*  --   TROPONINI 0.36* 1.44*    Estimated Creatinine Clearance: 21.9 mL/min (A) (by C-G formula based on SCr of 2.56 mg/dL (H)).   Medical History: Past Medical History:  Diagnosis Date  . Anxiety    takes Xanax prn  . Arthritis   . GERD (gastroesophageal reflux disease)   . H/O hiatal hernia   . Headache(784.0)    when b/p is elevated  . Hemorrhoids   . Hyperlipidemia    takes Simvastatin daily  . Hypertension    takes Amlodipine/HCTZ/Losartan daily  . Migraines   . Myocardial infarction ([place]) 1990's  . Peripheral vascular disease ([place])   . PONV (postoperative nausea and vomiting)   . Stroke [facility])    TIA history  . Type II diabetes mellitus (HCC)    takes Glucovance and Januvia daily  . Vertigo    takes Ativert prn    Assessment: 66 y/o F here with CP/elevated troponin to start heparin. Hgb 8.8, noted renal dysfunction, PTA meds reviewed.   Goal of Therapy:  Heparin level 0.3-0.7 units/ml Monitor platelets by anticoagulation protocol: Yes   Plan:  Heparin 4000 units BOLUS Start heparin drip at 750 units/hr 1300 HL Daily CBC/HL Monitor for bleeding   Narda Bonds 03/24/2017,3:37 AM

## 2017-03-24 NOTE — Progress Notes (Addendum)
ANTICOAGULATION CONSULT NOTE - Follow up [place] for Heparin  Indication: chest pain/ACS  Allergies  Allergen Reactions  . Plavix [Clopidogrel Bisulfate] Itching  . Codeine Other (See Comments)    "makes me feel strange"  . Tylenol With Codeine #3 [Acetaminophen-Codeine] Other (See Comments)    "doesn't make me feel right"  . Tylenol [Acetaminophen] Other (See Comments)    "Doesn't feel right"    Patient Measurements: Height: 5\' 4"  (162.6 cm) Weight: 158 lb 6.4 oz (71.8 kg) IBW/kg (Calculated) : 54.7  Vital Signs: Temp: 98.1 F (36.7 C) (07/06 0823) Temp Source: Oral (07/06 0823) BP: 137/56 (07/06 0823) Pulse Rate: 78 (07/06 0601)  Labs:  Recent Labs  03/24/17 0009 03/24/17 0200 03/24/17 0652  HGB 8.8*  --  8.5*  HCT 28.8*  --  28.5*  PLT 316  --  310  CREATININE 2.56*  --  2.61*  TROPONINI 0.36* 1.44* 3.86*    Estimated Creatinine Clearance: 21.5 mL/min (A) (by C-G formula based on SCr of 2.61 mg/dL (H)).   Medical History: Past Medical History:  Diagnosis Date  . Anemia   . Anxiety    takes Xanax prn  . Arthritis   . CKD (chronic kidney disease), stage IV ([place])   . Coronary artery disease   . GERD (gastroesophageal reflux disease)   . H/O hiatal hernia   . Headache(784.0)    when b/p is elevated  . Hemorrhoids   . Hyperlipidemia    takes Simvastatin daily  . Hypertension    takes Amlodipine/HCTZ/Losartan daily  . Migraines   . Myocardial infarction ([place]) 1990's  . Peripheral vascular disease ([place])   . PONV (postoperative nausea and vomiting)   . Stroke [facility])    TIA history  . Type II diabetes mellitus (HCC)    takes Glucovance and Januvia daily  . Vertigo    takes Ativert prn    Assessment: 66 y/o F here with CP/elevated troponins on heparin. Of note, pt with CKD IV.  First heparin level therapeutic at 0.30 on Heparin gtt 750 mg/hr s/p 4000 unit bolus. No issues with infusion.  Hgb low stable, PLTC wnl stable. No  bleeding noted.   Goal of Therapy:  Heparin level 0.3-0.7 units/ml Monitor platelets by anticoagulation protocol: Yes   Plan:  Continue heparin gtt at 750 units/hr Confirmatory 8-hr heparin level  Daily CBC/HL Monitor for bleeding  Carlean Jews, Pharm.D. PGY2 Pharmacy Resident 03/24/2017 11:43 AM Main Pharmacy: [phone]

## 2017-03-24 NOTE — Progress Notes (Signed)
Progress Note  Patient Name: Jenna Brown Date of Encounter: 03/24/2017  Primary Cardiologist: Bensimhon  Subjective   No chest pain this am. No dyspnea.   Inpatient Medications    Scheduled Meds: . acetaminophen  650 mg Oral Q8H  . aspirin  81 mg Oral Daily  . carvedilol  12.5 mg Oral BID WC  . furosemide  40 mg Oral Daily  . hydrALAZINE  25 mg Oral Q8H  . insulin aspart  5 Units Subcutaneous TID AC  . insulin glargine  25 Units Subcutaneous Daily  . isosorbide mononitrate  60 mg Oral Daily  . losartan  100 mg Oral Daily  . pravastatin  40 mg Oral Daily  . ticagrelor  90 mg Oral BID   Continuous Infusions: . heparin 750 Units/hr (03/24/17 0421)   PRN Meds: ALPRAZolam, nitroGLYCERIN, ondansetron (ZOFRAN) IV   Vital Signs    Vitals:   03/24/17 0430 03/24/17 0500 03/24/17 0601 03/24/17 0823  BP: (!) 148/63 (!) 155/62 (!) 155/76 (!) 137/56  Pulse: 80 83 78   Resp: 15 (!) 28 (!) 21   Temp:   98.9 F (37.2 C) 98.1 F (36.7 C)  TempSrc:   Oral Oral  SpO2: 99% 95% 99% 99%  Weight:   158 lb 6.4 oz (71.8 kg)   Height:   5\' 4"  (1.626 m)     Intake/Output Summary (Last 24 hours) at 03/24/17 1053 Last data filed at 03/24/17 0605  Gross per 24 hour  Intake               13 ml  Output                0 ml  Net               13 ml   Filed Weights   03/24/17 0003 03/24/17 0601  Weight: 159 lb (72.1 kg) 158 lb 6.4 oz (71.8 kg)    Telemetry    sinus - Personally Reviewed  ECG     sinus, lateral TWI, unchanged from prior ekg - Personally Reviewed  Physical Exam   GEN: No acute distress.   Neck: No JVD Cardiac: RRR, no murmurs, rubs, or gallops.  Respiratory: Clear to auscultation bilaterally. GI: Soft, nontender, non-distended  Ext: No edema left leg, Right AKA.  Neuro:  Nonfocal  Psych: Normal affect   Labs    Chemistry Recent Labs Lab 03/24/17 0009 03/24/17 0652  NA 138 140  K 4.7 4.3  CL 109 110  CO2 22 24  GLUCOSE 207* 89  BUN 42* 41*    CREATININE 2.56* 2.61*  CALCIUM 8.7* 8.7*  PROT 6.3*  --   ALBUMIN 3.1*  --   AST 18  --   ALT 14  --   ALKPHOS 91  --   BILITOT 0.5  --   GFRNONAA 19* 18*  GFRAA 22* 21*  ANIONGAP 7 6     Hematology Recent Labs Lab 03/24/17 0009 03/24/17 0652  WBC 12.0* 10.9*  RBC 3.48* 3.44*  HGB 8.8* 8.5*  HCT 28.8* 28.5*  MCV 82.8 82.8  MCH 25.3* 24.7*  MCHC 30.6 29.8*  RDW 13.2 13.3  PLT 316 310    Cardiac Enzymes Recent Labs Lab 03/24/17 0009 03/24/17 0200 03/24/17 0652  TROPONINI 0.36* 1.44* 3.86*   No results for input(s): TROPIPOC in the last 168 hours.   BNPNo results for input(s): BNP, PROBNP in the last 168 hours.   DDimer No results  for input(s): DDIMER in the last 168 hours.   Radiology    Dg Chest Port 1 View  Result Date: 03/24/2017 CLINICAL DATA:  Acute onset of generalized chest pain. Initial encounter. EXAM: PORTABLE CHEST 1 VIEW COMPARISON:  Chest radiograph performed 12/25/2016 FINDINGS: The lungs are hypoexpanded but appear grossly clear. No pleural effusion or pneumothorax is seen. The cardiomediastinal silhouette is borderline normal in size. No acute osseous abnormalities are identified. IMPRESSION: Lungs hypoexpanded but grossly clear. Electronically Signed   By: Garald Balding M.D.   On: 03/24/2017 00:13    Cardiac Studies     Patient Profile     66 y.o. female with HLD, prior CVA, HTN, DM, severe multi-vessel CAD, ischemic cardiomyopathy, stage 4 CKD, PAD s/p right AKA admitted with chest pain. She has undergone cardiac cath in July 2016 and was found to have severe triple vessel CAD. She was not felt to be a candidate for CABG at that time. PCI was discussed but given cardiogenic shock at that time, her medical therapy was optimized and cardiac cath was not repeated.   Assessment & Plan    1. CAD with unstable angina/NSTEMI: She is known to have severe multivessel CAD by cath in July 2016. She has done well with medical management of her CAD  since then. She was turned down for CABG in 2016. Some discussion regarding PCI of her RCA and Circumflex at that time but she was treated for cardiogenic shock and PCI was not performed. Now admitted with chest pain and elevated troponin. Most recent echo April 2018 with LVEF=45-50%. EKG with sinus, TWI in lateral leads that are chronic.  -She may need repeat cardiac cath before discharge but given her stage 4 CKD, cath is not the best option. Will attempt to control symptoms with medical therapy. Will plan echo today to assess LVEF. Continue ASA and Brilinta. Continue IV heparin. Follow serial troponin. -Imdur increased earlier today -Gentle hydration to optimize renal function in case cath is planned next week.   2. Ischemic cardiomyopathy: LVEF improved to 45-50% by most recent echo April 2018. Repeat echo pending. Continue beta blocker, ARB, hydralazine.   3. Chronic systolic CHF: Volume status seems to be ok today. Weight documented at 152 lbs in CHF clinic in may 2018 and 158 today. Will follow closely. Continue daily lasix.   4. DM: continue home insulin  5. HTN: BP controlled.   Signed, Lauree Chandler, MD  03/24/2017, 10:53 AM

## 2017-03-24 NOTE — ED Notes (Signed)
Cardiology rounding at bedside.

## 2017-03-24 NOTE — ED Notes (Signed)
Dr.Knapp aware of troponin level of 0.36

## 2017-03-24 NOTE — Progress Notes (Signed)
ANTICOAGULATION CONSULT NOTE - Follow up [place] for Heparin  Indication: chest pain/ACS  Allergies  Allergen Reactions  . Plavix [Clopidogrel Bisulfate] Itching  . Codeine Other (See Comments)    "makes me feel strange"  . Tylenol With Codeine #3 [Acetaminophen-Codeine] Other (See Comments)    "doesn't make me feel right"  . Tylenol [Acetaminophen] Other (See Comments)    "Doesn't feel right"    Patient Measurements: Height: 5\' 4"  (162.6 cm) Weight: 158 lb 6.4 oz (71.8 kg) IBW/kg (Calculated) : 54.7  Heparin dosing wt: 69.4kg  Vital Signs: Temp: 98.2 F (36.8 C) (07/06 2014) Temp Source: Oral (07/06 2014) BP: 123/55 (07/06 2014) Pulse Rate: 83 (07/06 2014)  Labs:  Recent Labs  03/24/17 0009  03/24/17 0652 03/24/17 1228 03/24/17 1744 03/24/17 2017  HGB 8.8*  --  8.5*  --   --   --   HCT 28.8*  --  28.5*  --   --   --   PLT 316  --  310  --   --   --   HEPARINUNFRC  --   --   --  0.30  --  0.10*  CREATININE 2.56*  --  2.61*  --   --   --   TROPONINI 0.36*  < > 3.86* 3.92* 2.69*  --   < > = values in this interval not displayed.  Estimated Creatinine Clearance: 21.5 mL/min (A) (by C-G formula based on SCr of 2.61 mg/dL (H)).   Medical History: Past Medical History:  Diagnosis Date  . Anemia   . Anxiety    takes Xanax prn  . Arthritis   . CKD (chronic kidney disease), stage IV ([place])   . Coronary artery disease   . GERD (gastroesophageal reflux disease)   . H/O hiatal hernia   . Headache(784.0)    when b/p is elevated  . Hemorrhoids   . Hyperlipidemia    takes Simvastatin daily  . Hypertension    takes Amlodipine/HCTZ/Losartan daily  . Migraines   . Myocardial infarction ([place]) 1990's  . Peripheral vascular disease ([place])   . PONV (postoperative nausea and vomiting)   . Stroke [facility])    TIA history  . Type II diabetes mellitus (HCC)    takes Glucovance and Januvia daily  . Vertigo    takes Ativert prn    Assessment: 66 y/o F  here with CP/elevated troponins on heparin. Of note, pt with CKD IV.  First heparin level therapeutic at 0.30 on Heparin gtt 750 mg/hr s/p 4000 unit bolus. No issues with infusion.  Hgb low stable, PLTC wnl stable. No bleeding noted.   Goal of Therapy:  Heparin level 0.3-0.7 units/ml Monitor platelets by anticoagulation protocol: Yes   Plan:  Continue heparin gtt at 750 units/hr Confirmatory 8-hr heparin level  Daily CBC/HL Monitor for bleeding  Carlean Jews, Pharm.D. PGY2 Pharmacy Resident 03/24/2017 11:43 AM Main Pharmacy: [phone]  ADDENDUM:  Confirmatory heparin level now low at 0.1. Per discussion with RN - no bleeding or IV line issues noted and drip has not been off.  Plan: Heparin 2000 unit bolus x 1 Increase heparin to 950 units/h 8h heparin level Daily heparin level/CBC Monitor for s/sx bleeding F/u Cardiology plans  Elicia Lamp, PharmD, BCPS Clinical Pharmacist 03/24/2017 9:36 PM

## 2017-03-24 NOTE — H&P (Signed)
CARDIOLOGY H&P Primary Physician: [name], MD Primary Cardiologist: Dr. Haroldine Laws  HPI: Jenna Brown is a 66 yo female with a pmhx of multivessel-CAD deemed to be a poor candidate for surgical or percutaneous revascularization,systolic HF with improved LVEF(LVEF 20% in 2016, now 45-50%),IDDM, HTN, CKD, dyslipidemia, prior CVA, and s/p right AKA who presented to [facility] this evening for evaluation of chest pain.  Pts sister at bedside assisted with history.  Pt was in her usual state of health up until Wednesday afternoon, when her family noted that her appetite was decreased and she was complaining of some worsening chest pain.  Pt states that she frequently gets chest pain which resolves with SL NTG, thus she did not think much else of it until this evening, when she developed worsening pain at around 1900.  Pt states that she was eating dinner when she experienced 8/10, midsternal discomfort described as a pressure, which radiated into her left arm and was similar to her prior AMIs.  Denies associated symptoms including SOB, palpitations, fevers, chills, nausea, diaphoresis, or dizziness.  She took SL NTG x 2-3 (she unsure of which), however when this did not improve the pain, she called EMS.  En route, she received another SL NTG which significantly improved her pain.  In the ED, nitropaste was administered which resulted in complete resolution of her pain.  Initial troponin was positive and EKG showed no acute ischemic changes compared to prior.  WBC was mildly elevated but she had not evidence of infectious process.  Cardiology was asked to admit for an NSTEMI.   Pt was admitted to [facility] in April of this year for similar symptoms in the setting of hypertensive emergency.  Trop peaked at around 4 at that time and it was felt that the NSTEMI may represent a type II MI due to elevated BP.  There was also some question regarding medication compliance at that time.  She states that since then, she  thinks she has been taking her medications as prescribed, though she admits to only taking Brilinta once daily because she was worried about her blood getting "too thin."  She also recently started iron supplementation as per her nephrologist for chronic iron deficiency anemia.      Review of Systems:     Cardiac Review of Systems: {Y] = yes [ ]  = no  Chest Pain [  Y  ]  Resting SOB [   ] Exertional SOB  [  ]  Orthopnea [  ]   Pedal Edema [   ]    Palpitations [  ] Syncope  [  ]   Presyncope [   ]  General Review of Systems: [Y] = yes [  ]=no Constitional: recent weight change [  ]; anorexia [  ]; fatigue [  ]; nausea [  ]; night sweats [  ]; fever [  ]; or chills [  ];                                                                     Dental: poor dentition[  ];   Eye : blurred vision [  ]; diplopia [   ]; vision changes [  ];  Amaurosis fugax[  ]; Resp: cough [  ];  wheezing[  ];  hemoptysis[  ]; shortness of breath[  ]; paroxysmal nocturnal dyspnea[  ]; dyspnea on exertion[  ]; or orthopnea[  ];  GI:  gallstones[  ], vomiting[  ];  dysphagia[  ]; melena[  ];  hematochezia [  ]; heartburn[  ];   GU: kidney stones [  ]; hematuria[  ];   dysuria [  ];  nocturia[  ];               Skin: rash [  ], swelling[  ];, hair loss[  ];  peripheral edema[  ];  or itching[  ]; Musculosketetal: myalgias[  ];  joint swelling[  ];  joint erythema[  ];  joint pain[  ];  back pain[  ];  Heme/Lymph: bruising[  ];  bleeding[  ];  anemia[  ];  Neuro: TIA[  ];  headaches[  ];  stroke[  ];  vertigo[  ];  seizures[  ];   paresthesias[  ];  difficulty walking[  ];  Psych:depression[  ]; anxiety[  ];  Endocrine: diabetes[  ];  thyroid dysfunction[  ];  Other:  Past Medical History:  Diagnosis Date  . Anxiety    takes Xanax prn  . Arthritis   . GERD (gastroesophageal reflux disease)   . H/O hiatal hernia   . Headache(784.0)    when b/p is elevated  . Hemorrhoids   . Hyperlipidemia    takes Simvastatin  daily  . Hypertension    takes Amlodipine/HCTZ/Losartan daily  . Migraines   . Myocardial infarction ([place]) 1990's  . Peripheral vascular disease ([place])   . PONV (postoperative nausea and vomiting)   . Stroke [facility])    TIA history  . Type II diabetes mellitus (HCC)    takes Glucovance and Januvia daily  . Vertigo    takes Ativert prn    @[facility]MED @   Allergies  Allergen Reactions  . Plavix [Clopidogrel Bisulfate] Itching  . Codeine Other (See Comments)    "makes me feel strange"  . Tylenol With Codeine #3 [Acetaminophen-Codeine] Other (See Comments)    "doesn't make me feel right"  . Tylenol [Acetaminophen] Other (See Comments)    "Doesn't feel right"     Social History   Social History  . Marital status: Single    Spouse name: N/A  . Number of children: N/A  . Years of education: N/A   Occupational History  . Not on file.   Social History Main Topics  . Smoking status: Former Smoker    Packs/day: 0.25    Years: 40.00    Types: Cigarettes    Quit date: 02/27/2012  . Smokeless tobacco: Never Used  . Alcohol use No  . Drug use: No  . Sexual activity: No   Other Topics Concern  . Not on file   Social History Narrative  . No narrative on file    Family History  Problem Relation Age of Onset  . Cancer Mother        breast  . Diabetes Father     PHYSICAL EXAM: Vitals:   03/24/17 0330 03/24/17 0400  BP: 131/64 126/60  Pulse: 86 82  Resp: 19 (!) 21  Temp:     General:  Well appearing. No respiratory difficulty HEENT: normal Neck: supple. no JVD. Carotids 2+ bilat; no bruits. No lymphadenopathy or thryomegaly appreciated. Cor: PMI nondisplaced. Regular rate & rhythm. No rubs, gallops or murmurs. Lungs: clear Abdomen: soft, nontender, nondistended. No hepatosplenomegaly. No bruits or  masses. Good bowel sounds. Extremities: s/p R AKA, no cyanosis, clubbing, rash, edema Neuro: alert & oriented x 3, cranial nerves grossly intact. moves all 4 extremities w/o  difficulty. Affect pleasant.  ECG: NSR, lateral TWI  Results for orders placed or performed during the hospital encounter of 03/23/17 (from the past 24 hour(s))  Comprehensive metabolic panel     Status: Abnormal   Collection Time: 03/24/17 12:09 AM  Result Value Ref Range   Sodium 138 135 - 145 mmol/L   Potassium 4.7 3.5 - 5.1 mmol/L   Chloride 109 101 - 111 mmol/L   CO2 22 22 - 32 mmol/L   Glucose, Bld 207 (H) 65 - 99 mg/dL   BUN 42 (H) 6 - 20 mg/dL   Creatinine, Ser 2.56 (H) 0.44 - 1.00 mg/dL   Calcium 8.7 (L) 8.9 - 10.3 mg/dL   Total Protein 6.3 (L) 6.5 - 8.1 g/dL   Albumin 3.1 (L) 3.5 - 5.0 g/dL   AST 18 15 - 41 U/L   ALT 14 14 - 54 U/L   Alkaline Phosphatase 91 38 - 126 U/L   Total Bilirubin 0.5 0.3 - 1.2 mg/dL   GFR calc non Af Amer 19 (L) >60 mL/min   GFR calc Af Amer 22 (L) >60 mL/min   Anion gap 7 5 - 15  CBC with Differential     Status: Abnormal   Collection Time: 03/24/17 12:09 AM  Result Value Ref Range   WBC 12.0 (H) 4.0 - 10.5 K/uL   RBC 3.48 (L) 3.87 - 5.11 MIL/uL   Hemoglobin 8.8 (L) 12.0 - 15.0 g/dL   HCT 28.8 (L) 36.0 - 46.0 %   MCV 82.8 78.0 - 100.0 fL   MCH 25.3 (L) 26.0 - 34.0 pg   MCHC 30.6 30.0 - 36.0 g/dL   RDW 13.2 11.5 - 15.5 %   Platelets 316 150 - 400 K/uL   Neutrophils Relative % 81 %   Neutro Abs 9.7 (H) 1.7 - 7.7 K/uL   Lymphocytes Relative 14 %   Lymphs Abs 1.7 0.7 - 4.0 K/uL   Monocytes Relative 5 %   Monocytes Absolute 0.6 0.1 - 1.0 K/uL   Eosinophils Relative 0 %   Eosinophils Absolute 0.0 0.0 - 0.7 K/uL   Basophils Relative 0 %   Basophils Absolute 0.0 0.0 - 0.1 K/uL  Troponin I     Status: Abnormal   Collection Time: 03/24/17 12:09 AM  Result Value Ref Range   Troponin I 0.36 (HH) <0.03 ng/mL  Troponin I     Status: Abnormal   Collection Time: 03/24/17  2:00 AM  Result Value Ref Range   Troponin I 1.44 (HH) <0.03 ng/mL   Dg Chest Port 1 View  Result Date: 03/24/2017 CLINICAL DATA:  Acute onset of generalized chest pain.  Initial encounter. EXAM: PORTABLE CHEST 1 VIEW COMPARISON:  Chest radiograph performed 12/25/2016 FINDINGS: The lungs are hypoexpanded but appear grossly clear. No pleural effusion or pneumothorax is seen. The cardiomediastinal silhouette is borderline normal in size. No acute osseous abnormalities are identified. IMPRESSION: Lungs hypoexpanded but grossly clear. Electronically Signed   By: Garald Balding M.D.   On: 03/24/2017 00:13    LHC 03/2015:   Prox LAD to Mid LAD lesion, 90% stenosed.  Ost Cx to Mid Cx lesion, 95% stenosed.  Ost 2nd Mrg to 2nd Mrg lesion, 80% stenosed.  Mid Cx lesion, 90% stenosed.  Prox RCA lesion, 60% stenosed.  Mid RCA lesion, 50% stenosed.  ASSESSMENT: 66 yo female with a pmhx of MVCAD previously felt to be poorly-amenable to revascularization, CKD, IDDM, HTN, HLD, and systolic heart failure who presents with chest pain and an NSTEMI. Trop has risen to 1.44, however EKG shows no new ST-T wave abnormalities and her chest pain has completely resolved following nitropaste.  BP has been well controlled in the ED, unlike her prior NSTEMI admission which was felt to be related to a type II MI In the setting of hypertensive emergency.  At that time there were concerns about medication noncompliance, which I think is still a large issue as she admits to not taking her Brilinta twice daily.    PLAN/DISCUSSION: 1. NSTEMI -Admit to the stepdown the telemetry monitoring -Continue asa and brilinta.  I stressed the importance of medication compliance -Start on heparin infusion -Trend troponin, serial EKG -limited TTE in AM to look for new LV systolic dysfunction or Regional WMA -Continue Imdur, which I increased to 60mg  daily -PRN SL NTG for chest pain -Continue Coreg and pravachol -If she continues to have pain, may need to revisist revascularization options, though this remains suboptimal given her CKD and medication compliance issues.   2. HTN -Continue home  hydralazine, coreg, cozaar -Well controlled at this time  3. IDDM -Continue home insulin  4. CKD -Stable  5. Anemia -Stable, likely related to chronic renal failure and iron deficiency  6. Leukocytosis -May be reactive as there is no other sign or symptom of infection at this time  Regino Bellow, DO 4:30 AM

## 2017-03-24 NOTE — Progress Notes (Signed)
Recommend adding Novolog SENSITIVE correction scale TID & HS while in the hospital. Will continue to monitor blood sugars.   Harvel Ricks RN BSN CDE Diabetes Coordinator Pager: [phone]  8am-5pm

## 2017-03-25 DIAGNOSIS — I25110 Atherosclerotic heart disease of native coronary artery with unstable angina pectoris: Secondary | ICD-10-CM

## 2017-03-25 DIAGNOSIS — E782 Mixed hyperlipidemia: Secondary | ICD-10-CM

## 2017-03-25 DIAGNOSIS — I739 Peripheral vascular disease, unspecified: Secondary | ICD-10-CM

## 2017-03-25 DIAGNOSIS — I5022 Chronic systolic (congestive) heart failure: Secondary | ICD-10-CM

## 2017-03-25 DIAGNOSIS — I10 Essential (primary) hypertension: Secondary | ICD-10-CM

## 2017-03-25 LAB — CBC
HEMATOCRIT: 26.1 % — AB (ref 36.0–46.0)
HEMOGLOBIN: 8 g/dL — AB (ref 12.0–15.0)
MCH: 25.6 pg — AB (ref 26.0–34.0)
MCHC: 30.7 g/dL (ref 30.0–36.0)
MCV: 83.4 fL (ref 78.0–100.0)
PLATELETS: 279 10*3/uL (ref 150–400)
RBC: 3.13 MIL/uL — AB (ref 3.87–5.11)
RDW: 13.4 % (ref 11.5–15.5)
WBC: 8.4 10*3/uL (ref 4.0–10.5)

## 2017-03-25 LAB — HEPARIN LEVEL (UNFRACTIONATED)
HEPARIN UNFRACTIONATED: 0.5 [IU]/mL (ref 0.30–0.70)
Heparin Unfractionated: 0.65 IU/mL (ref 0.30–0.70)

## 2017-03-25 LAB — BASIC METABOLIC PANEL
ANION GAP: 8 (ref 5–15)
BUN: 47 mg/dL — ABNORMAL HIGH (ref 6–20)
CHLORIDE: 110 mmol/L (ref 101–111)
CO2: 22 mmol/L (ref 22–32)
Calcium: 8.4 mg/dL — ABNORMAL LOW (ref 8.9–10.3)
Creatinine, Ser: 3.14 mg/dL — ABNORMAL HIGH (ref 0.44–1.00)
GFR calc non Af Amer: 15 mL/min — ABNORMAL LOW (ref >60)
GFR, EST AFRICAN AMERICAN: 17 mL/min — AB (ref >60)
Glucose, Bld: 112 mg/dL — ABNORMAL HIGH (ref 65–99)
POTASSIUM: 4.5 mmol/L (ref 3.5–5.1)
SODIUM: 140 mmol/L (ref 135–145)

## 2017-03-25 LAB — GLUCOSE, CAPILLARY
GLUCOSE-CAPILLARY: 203 mg/dL — AB (ref 65–99)
GLUCOSE-CAPILLARY: 119 mg/dL — AB (ref 65–99)
GLUCOSE-CAPILLARY: 198 mg/dL — AB (ref 65–99)
Glucose-Capillary: 118 mg/dL — ABNORMAL HIGH (ref 65–99)

## 2017-03-25 NOTE — Progress Notes (Signed)
Progress Note  Patient Name: Jenna Brown Date of Encounter: 03/25/2017  Primary Cardiologist: Bensimhon  Subjective   No chest pain at present. Denies shortness of breath.  Inpatient Medications    Scheduled Meds: . acetaminophen  650 mg Oral Q8H  . aspirin  81 mg Oral Daily  . carvedilol  12.5 mg Oral BID WC  . furosemide  40 mg Oral Daily  . hydrALAZINE  25 mg Oral Q8H  . insulin aspart  5 Units Subcutaneous TID AC  . insulin glargine  25 Units Subcutaneous Daily  . isosorbide mononitrate  60 mg Oral Daily  . pravastatin  40 mg Oral Daily  . ticagrelor  90 mg Oral BID   Continuous Infusions: . heparin 950 Units/hr (03/24/17 2143)   PRN Meds: ALPRAZolam, nitroGLYCERIN, ondansetron (ZOFRAN) IV   Vital Signs    Vitals:   03/24/17 1240 03/24/17 1640 03/24/17 2014 03/25/17 0500  BP: (!) 108/58 (!) 122/54 (!) 123/55 (!) 154/58  Pulse:   83 73  Resp:   (!) 21 13  Temp: 98.5 F (36.9 C) (!) 97.4 F (36.3 C) 98.2 F (36.8 C) (!) 97.5 F (36.4 C)  TempSrc: Oral Oral Oral Oral  SpO2: 98% 99% 96% 100%  Weight:      Height:        Intake/Output Summary (Last 24 hours) at 03/25/17 0936 Last data filed at 03/25/17 0900  Gross per 24 hour  Intake           986.67 ml  Output                0 ml  Net           986.67 ml   Filed Weights   03/24/17 0003 03/24/17 0601  Weight: 159 lb (72.1 kg) 158 lb 6.4 oz (71.8 kg)    Telemetry    Sinus rhythm with PVC's - Personally Reviewed  ECG    NSR with TWI's in I, II, aVL, V4-V6- Personally Reviewed  Physical Exam   GEN: No acute distress.   Neck: No JVD Cardiac: RRR, no murmurs, rubs, or gallops.  Respiratory: Clear to auscultation bilaterally. GI: Soft, nontender, non-distended  MS: Right AKA. Trivial edema in left leg. Neuro:  Nonfocal  Psych: Normal affect   Labs    Chemistry Recent Labs Lab 03/24/17 0009 03/24/17 0652 03/25/17 0609  NA 138 140 140  K 4.7 4.3 4.5  CL 109 110 110  CO2 22 24 22     GLUCOSE 207* 89 112*  BUN 42* 41* 47*  CREATININE 2.56* 2.61* 3.14*  CALCIUM 8.7* 8.7* 8.4*  PROT 6.3*  --   --   ALBUMIN 3.1*  --   --   AST 18  --   --   ALT 14  --   --   ALKPHOS 91  --   --   BILITOT 0.5  --   --   GFRNONAA 19* 18* 15*  GFRAA 22* 21* 17*  ANIONGAP 7 6 8      Hematology Recent Labs Lab 03/24/17 0009 03/24/17 0652 03/25/17 0609  WBC 12.0* 10.9* 8.4  RBC 3.48* 3.44* 3.13*  HGB 8.8* 8.5* 8.0*  HCT 28.8* 28.5* 26.1*  MCV 82.8 82.8 83.4  MCH 25.3* 24.7* 25.6*  MCHC 30.6 29.8* 30.7  RDW 13.2 13.3 13.4  PLT 316 310 279    Cardiac Enzymes Recent Labs Lab 03/24/17 0200 03/24/17 0652 03/24/17 1228 03/24/17 1744  TROPONINI 1.44* 3.86* 3.92*  2.69*   No results for input(s): TROPIPOC in the last 168 hours.   BNPNo results for input(s): BNP, PROBNP in the last 168 hours.   DDimer No results for input(s): DDIMER in the last 168 hours.   Radiology    Dg Chest Port 1 View  Result Date: 03/24/2017 CLINICAL DATA:  Acute onset of generalized chest pain. Initial encounter. EXAM: PORTABLE CHEST 1 VIEW COMPARISON:  Chest radiograph performed 12/25/2016 FINDINGS: The lungs are hypoexpanded but appear grossly clear. No pleural effusion or pneumothorax is seen. The cardiomediastinal silhouette is borderline normal in size. No acute osseous abnormalities are identified. IMPRESSION: Lungs hypoexpanded but grossly clear. Electronically Signed   By: Garald Balding M.D.   On: 03/24/2017 00:13    Cardiac Studies   Echo (03/24/17):  Study Conclusions  - Left ventricle: LVEF is approximately 45 to 50% with mild   hypokinesis of the distal inferior/inferoseptal and apical walls.   The cavity size was normal. Wall thickness was increased in a   pattern of mild LVH. Doppler parameters are consistent with   abnormal left ventricular relaxation (grade 1 diastolic   dysfunction). - Aortic valve: AV is thickened, calcified In the parasternal view   there is an nodular echo  bright segment (18 x 7 mm) May represent   nodular thickening of valve Not convincing for discrete mass. It   appears to be present on TTE in April 2018 TEE after that showed   no abnormalitiy to AV. - Mitral valve: Calcified annulus. Mildly thickened leaflets .  Patient Profile     66 y.o. female with HLD, prior CVA, HTN, DM, severe multi-vessel CAD, ischemic cardiomyopathy, stage 4 CKD, PAD s/p right AKA admitted with chest pain. She has undergone cardiac cath in July 2016 and was found to have severe triple vessel CAD. She was not felt to be a candidate for CABG at that time. PCI was discussed but given cardiogenic shock at that time, her medical therapy was optimized and cardiac cath was not repeated.   Assessment & Plan    1. CAD with NSTEMI: Symptomatically stable today. She is known to have severe multivessel CAD by cath in July 2016. She has done well with medical management of her CAD since then. She was turned down for CABG in 2016. Some discussion regarding PCI of her RCA and Circumflex at that time but she was treated for cardiogenic shock and PCI was not performed. Now admitted with chest pain and elevated troponin. Most recent echo April 2018 with LVEF=45-50%.  Echo from 03/24/17 reviewed above and LVEF is stable. EKG with sinus, TWI in lateral leads that are chronic (personally reviewed today).  -She may need repeat cardiac cath before discharge but given her stage 4 CKD, cath is not the best option. Will attempt to control symptoms with medical therapy. Continue ASA, statin, Coreg, and Brilinta along with IV heparin. Troponins are trending down (peaked at 3.92, down to 2.69 03/24/17). -Imdur increased to 60 mg on 03/24/17. -Gentle hydration to optimize renal function in case cath is planned next week.   2. Ischemic cardiomyopathy: LVEF stable at 45-50% by echo yesterday. Continue Coreg and hydralazine. I will discontinue ARB in context of CKD stage 4.  3. Chronic systolic CHF:  Appears euvolemic. Continue daily lasix.   4. DM: continue home insulin  5. HTN: BP controlled. I will discontinue ARB in context of CKD stage 4. May need to increase hydralazine if BP rises.  6. Hyperlipidemia: Continue statin.  Signed, Kate Sable, MD  03/25/2017, 9:36 AM

## 2017-03-25 NOTE — Progress Notes (Signed)
Pt refusing bed alarm. Pt educated on the importance of calling for bathroom assistance. Call bell and phone with in reach.  Will cont to monitor pt.

## 2017-03-25 NOTE — Progress Notes (Signed)
ANTICOAGULATION CONSULT NOTE - Follow up [place] for Heparin  Indication: chest pain/ACS  Allergies  Allergen Reactions  . Plavix [Clopidogrel Bisulfate] Itching  . Codeine Other (See Comments)    "makes me feel strange"  . Tylenol With Codeine #3 [Acetaminophen-Codeine] Other (See Comments)    "doesn't make me feel right"  . Tylenol [Acetaminophen] Other (See Comments)    "Doesn't feel right"    Patient Measurements: Height: 5\' 4"  (162.6 cm) Weight: 158 lb 6.4 oz (71.8 kg) IBW/kg (Calculated) : 54.7  Heparin dosing wt: 69.4kg  Vital Signs: Temp: 97.5 F (36.4 C) (07/07 0500) Temp Source: Oral (07/07 0500) BP: 154/58 (07/07 0500) Pulse Rate: 73 (07/07 0500)  Labs:  Recent Labs  03/24/17 0009  03/24/17 6270 03/24/17 1228 03/24/17 1744 03/24/17 2017 03/25/17 0609  HGB 8.8*  --  8.5*  --   --   --  8.0*  HCT 28.8*  --  28.5*  --   --   --  26.1*  PLT 316  --  310  --   --   --  279  HEPARINUNFRC  --   --   --  0.30  --  0.10* 0.65  CREATININE 2.56*  --  2.61*  --   --   --   --   TROPONINI 0.36*  < > 3.86* 3.92* 2.69*  --   --   < > = values in this interval not displayed.  Estimated Creatinine Clearance: 21.5 mL/min (A) (by C-G formula based on SCr of 2.61 mg/dL (H)).   Medical History: Past Medical History:  Diagnosis Date  . Anemia   . Anxiety    takes Xanax prn  . Arthritis   . CKD (chronic kidney disease), stage IV ([place])   . Coronary artery disease   . GERD (gastroesophageal reflux disease)   . H/O hiatal hernia   . Headache(784.0)    when b/p is elevated  . Hemorrhoids   . Hyperlipidemia    takes Simvastatin daily  . Hypertension    takes Amlodipine/HCTZ/Losartan daily  . Migraines   . Myocardial infarction ([place]) 1990's  . Peripheral vascular disease ([place])   . PONV (postoperative nausea and vomiting)   . Stroke [facility])    TIA history  . Type II diabetes mellitus (HCC)    takes Glucovance and Januvia daily  . Vertigo    takes Ativert prn    Assessment: 66 y/o F here with CP/elevated troponins on heparin. Of note, pt with CKD IV and with bump in SCr today. Hgb low-stable and platelets normal. Heparin level therapeutic at 0.65, but near top end of goal range. No overt s/s bleeding noted.   Goal of Therapy:  Heparin level 0.3-0.7 units/ml Monitor platelets by anticoagulation protocol: Yes   Plan:  Continue heparin gtt at 950 units/hr  Confirmatory heparin level in 8 hrs  Heparin level and CBC daily  Monitor for s/s bleeding  Argie Ramming, PharmD Clinical Pharmacist 03/25/17 7:18 AM

## 2017-03-25 NOTE — Progress Notes (Signed)
ANTICOAGULATION CONSULT NOTE - Follow up [place] for Heparin  Indication: chest pain/ACS  Allergies  Allergen Reactions  . Plavix [Clopidogrel Bisulfate] Itching  . Codeine Other (See Comments)    "makes me feel strange"  . Tylenol With Codeine #3 [Acetaminophen-Codeine] Other (See Comments)    "doesn't make me feel right"  . Tylenol [Acetaminophen] Other (See Comments)    "Doesn't feel right"    Patient Measurements: Height: 5\' 4"  (162.6 cm) Weight: 158 lb 6.4 oz (71.8 kg) IBW/kg (Calculated) : 54.7  Heparin dosing wt: 69.4kg  Vital Signs: Temp: 97.8 F (36.6 C) (07/07 1445) Temp Source: Oral (07/07 1445) BP: 146/71 (07/07 1445) Pulse Rate: 79 (07/07 1445)  Labs:  Recent Labs  03/24/17 0009  03/24/17 0017  03/24/17 1228 03/24/17 1744 03/24/17 2017 03/25/17 0609 03/25/17 1349  HGB 8.8*  --  8.5*  --   --   --   --  8.0*  --   HCT 28.8*  --  28.5*  --   --   --   --  26.1*  --   PLT 316  --  310  --   --   --   --  279  --   HEPARINUNFRC  --   --   --   < > 0.30  --  0.10* 0.65 0.50  CREATININE 2.56*  --  2.61*  --   --   --   --  3.14*  --   TROPONINI 0.36*  < > 3.86*  --  3.92* 2.69*  --   --   --   < > = values in this interval not displayed.  Estimated Creatinine Clearance: 17.8 mL/min (A) (by C-G formula based on SCr of 3.14 mg/dL (H)).   Medical History: Past Medical History:  Diagnosis Date  . Anemia   . Anxiety    takes Xanax prn  . Arthritis   . CKD (chronic kidney disease), stage IV ([place])   . Coronary artery disease   . GERD (gastroesophageal reflux disease)   . H/O hiatal hernia   . Headache(784.0)    when b/p is elevated  . Hemorrhoids   . Hyperlipidemia    takes Simvastatin daily  . Hypertension    takes Amlodipine/HCTZ/Losartan daily  . Migraines   . Myocardial infarction ([place]) 1990's  . Peripheral vascular disease ([place])   . PONV (postoperative nausea and vomiting)   . Stroke [facility])    TIA history  . Type II  diabetes mellitus (HCC)    takes Glucovance and Januvia daily  . Vertigo    takes Ativert prn    Assessment: 66 y/o F here with CP/elevated troponins on heparin. Of note, pt with CKD IV and with bump in SCr 2.5> 3.2. Hgb low-stable and platelets normal. Heparin level therapeutic at 0.5. No overt s/s bleeding noted.   Goal of Therapy:  Heparin level 0.3-0.7 units/ml Monitor platelets by anticoagulation protocol: Yes   Plan:  Continue heparin gtt at 950 units/hr  Heparin level and CBC daily  Monitor for s/s bleeding  Bonnita Nasuti Pharm.D. CPP, BCPS Clinical Pharmacist [phone] 03/25/2017 3:08 PM

## 2017-03-26 LAB — CBC
HEMATOCRIT: 26.1 % — AB (ref 36.0–46.0)
Hemoglobin: 8 g/dL — ABNORMAL LOW (ref 12.0–15.0)
MCH: 25.6 pg — ABNORMAL LOW (ref 26.0–34.0)
MCHC: 30.7 g/dL (ref 30.0–36.0)
MCV: 83.7 fL (ref 78.0–100.0)
PLATELETS: 272 10*3/uL (ref 150–400)
RBC: 3.12 MIL/uL — ABNORMAL LOW (ref 3.87–5.11)
RDW: 13.3 % (ref 11.5–15.5)
WBC: 9.1 10*3/uL (ref 4.0–10.5)

## 2017-03-26 LAB — GLUCOSE, CAPILLARY
GLUCOSE-CAPILLARY: 142 mg/dL — AB (ref 65–99)
GLUCOSE-CAPILLARY: 88 mg/dL (ref 65–99)
Glucose-Capillary: 163 mg/dL — ABNORMAL HIGH (ref 65–99)
Glucose-Capillary: 80 mg/dL (ref 65–99)

## 2017-03-26 LAB — BASIC METABOLIC PANEL
Anion gap: 9 (ref 5–15)
BUN: 52 mg/dL — AB (ref 6–20)
CO2: 21 mmol/L — ABNORMAL LOW (ref 22–32)
CREATININE: 3.33 mg/dL — AB (ref 0.44–1.00)
Calcium: 8.4 mg/dL — ABNORMAL LOW (ref 8.9–10.3)
Chloride: 107 mmol/L (ref 101–111)
GFR calc Af Amer: 16 mL/min — ABNORMAL LOW (ref >60)
GFR, EST NON AFRICAN AMERICAN: 14 mL/min — AB (ref >60)
GLUCOSE: 139 mg/dL — AB (ref 65–99)
Potassium: 4.3 mmol/L (ref 3.5–5.1)
SODIUM: 137 mmol/L (ref 135–145)

## 2017-03-26 LAB — HEPARIN LEVEL (UNFRACTIONATED): HEPARIN UNFRACTIONATED: 0.36 [IU]/mL (ref 0.30–0.70)

## 2017-03-26 MED ORDER — SENNA 8.6 MG PO TABS
2.0000 | ORAL_TABLET | Freq: Every evening | ORAL | Status: DC | PRN
  Administered 2017-03-26: 17.2 mg via ORAL
  Filled 2017-03-26: qty 2

## 2017-03-26 NOTE — Plan of Care (Signed)
Problem: Safety: Goal: Ability to remain free from injury will improve Outcome: Progressing BSC within reach. Call bell within reach. Independent to [facility].

## 2017-03-26 NOTE — Progress Notes (Signed)
Progress Note  Patient Name: Jenna Brown Date of Encounter: 03/26/2017  Primary Cardiologist: Bensimhon  Subjective   Denies chest pain and shortness of breath.  Inpatient Medications    Scheduled Meds: . acetaminophen  650 mg Oral Q8H  . aspirin  81 mg Oral Daily  . carvedilol  12.5 mg Oral BID WC  . hydrALAZINE  25 mg Oral Q8H  . insulin aspart  5 Units Subcutaneous TID AC  . insulin glargine  25 Units Subcutaneous Daily  . isosorbide mononitrate  60 mg Oral Daily  . pravastatin  40 mg Oral Daily  . ticagrelor  90 mg Oral BID   Continuous Infusions: . heparin 950 Units/hr (03/26/17 0553)   PRN Meds: ALPRAZolam, nitroGLYCERIN, ondansetron (ZOFRAN) IV   Vital Signs    Vitals:   03/25/17 1648 03/25/17 1957 03/26/17 0540 03/26/17 0557  BP: (!) 110/58 138/60 (!) 137/55   Pulse: (!) 58 72 84   Resp: 16 20 18    Temp:  97.8 F (36.6 C) (!) 97.5 F (36.4 C)   TempSrc:  Oral Oral   SpO2: 99% 99% 99%   Weight:    159 lb 8 oz (72.3 kg)  Height:        Intake/Output Summary (Last 24 hours) at 03/26/17 1130 Last data filed at 03/26/17 0548  Gross per 24 hour  Intake          1355.44 ml  Output             2100 ml  Net          -744.56 ml   Filed Weights   03/24/17 0003 03/24/17 0601 03/26/17 0557  Weight: 159 lb (72.1 kg) 158 lb 6.4 oz (71.8 kg) 159 lb 8 oz (72.3 kg)    Telemetry    Sinus rhythm with PVC's - Personally Reviewed  ECG    NSR with TWI's in I, II, aVL, V4-V6- Personally Reviewed  Physical Exam   GEN:No acute distress.   Neck:No JVD Cardiac:RRR, no murmurs, rubs, or gallops.  Respiratory:Clear to auscultation bilaterally. FY:BOFB, nontender, non-distended  PZ:WCHEN AKA. Trivial edema in left leg. Neuro:Nonfocal  Psych: Normal affect   Labs    Chemistry Recent Labs Lab 03/24/17 0009 03/24/17 0652 03/25/17 0609 03/26/17 0310  NA 138 140 140 137  K 4.7 4.3 4.5 4.3  CL 109 110 110 107  CO2 22 24 22  21*  GLUCOSE 207* 89  112* 139*  BUN 42* 41* 47* 52*  CREATININE 2.56* 2.61* 3.14* 3.33*  CALCIUM 8.7* 8.7* 8.4* 8.4*  PROT 6.3*  --   --   --   ALBUMIN 3.1*  --   --   --   AST 18  --   --   --   ALT 14  --   --   --   ALKPHOS 91  --   --   --   BILITOT 0.5  --   --   --   GFRNONAA 19* 18* 15* 14*  GFRAA 22* 21* 17* 16*  ANIONGAP 7 6 8 9      Hematology Recent Labs Lab 03/24/17 2778 03/25/17 0609 03/26/17 0310  WBC 10.9* 8.4 9.1  RBC 3.44* 3.13* 3.12*  HGB 8.5* 8.0* 8.0*  HCT 28.5* 26.1* 26.1*  MCV 82.8 83.4 83.7  MCH 24.7* 25.6* 25.6*  MCHC 29.8* 30.7 30.7  RDW 13.3 13.4 13.3  PLT 310 279 272    Cardiac Enzymes Recent Labs Lab 03/24/17 0200  03/24/17 0652 03/24/17 1228 03/24/17 1744  TROPONINI 1.44* 3.86* 3.92* 2.69*   No results for input(s): TROPIPOC in the last 168 hours.   BNPNo results for input(s): BNP, PROBNP in the last 168 hours.   DDimer No results for input(s): DDIMER in the last 168 hours.   Radiology    No results found.  Cardiac Studies   Echo (03/24/17):  Study Conclusions  - Left ventricle: LVEF is approximately 45 to 50% with mild hypokinesis of the distal inferior/inferoseptal and apical walls. The cavity size was normal. Wall thickness was increased in a pattern of mild LVH. Doppler parameters are consistent with abnormal left ventricular relaxation (grade 1 diastolic dysfunction). - Aortic valve: AV is thickened, calcified In the parasternal view there is an nodular echo bright segment (18 x 7 mm) May represent nodular thickening of valve Not convincing for discrete mass. It appears to be present on TTE in April 2018 TEE after that showed no abnormalitiy to AV. - Mitral valve: Calcified annulus. Mildly thickened leaflets .  Patient Profile     66 y.o. female with HLD, prior CVA, HTN, DM, severe multi-vessel CAD, ischemic cardiomyopathy, stage 4 CKD, PAD s/p right AKA admitted with chest pain. She has undergone cardiac cath in July  2016 and was found to have severe triple vessel CAD. She was not felt to be a candidate for CABG at that time. PCI was discussed but given cardiogenic shock at that time, her medical therapy was optimized and cardiac cath was not repeated.   Assessment & Plan    1. CAD with NSTEMI: Symptomatically stable today. She is known to have severe multivessel CAD by cath in July 2016. She has done well with medical management of her CAD since then. She was turned down for CABG in 2016. Some discussion regarding PCI of her RCA and circumflex at that time but she was treated for cardiogenic shock and PCI was not performed. Now admitted with chest pain and elevated troponin. Most recent echo April 2018 with LVEF=45-50%.  Echo from 03/24/17 reviewed above and LVEF is stable. EKG with sinus, TWI in lateral leads that are chronic. -She may need repeat cardiac cath before discharge but given her stage 4 CKD, cath is not the best option. Will attempt to control symptoms with medical therapy. Continue ASA, statin, Coreg, and Brilinta along with IV heparin. Troponins are trending down (peaked at 3.92, down to 2.69 03/24/17). -Imdur increased to 60 mg on 03/24/17. -She already received gentle hydration to optimize renal function in case cath is planned next week.  Creatinine up to 3.33 (2.61 on 7/6). I will hold Lasix today.  2. Ischemic cardiomyopathy: LVEF stable at 45-50% by echo yesterday. Continue Coreg and hydralazine. I have discontinue ARB in context of CKD stage 4.  3. Chronic systolic CHF: Appears euvolemic. Creatinine up to 3.33 (2.61 on 7/6). I will hold Lasix today.  4. DM: continue home insulin  5. HTN: BP controlled. I have discontinue ARB in context of CKD stage 4. May need to increase hydralazine if BP rises.  6. Hyperlipidemia: Continue statin.   Signed, Kate Sable, MD  03/26/2017, 11:30 AM

## 2017-03-26 NOTE — Progress Notes (Signed)
ANTICOAGULATION CONSULT NOTE - Follow up [place] for Heparin  Indication: chest pain/ACS  Allergies  Allergen Reactions  . Plavix [Clopidogrel Bisulfate] Itching  . Codeine Other (See Comments)    "makes me feel strange"  . Tylenol With Codeine #3 [Acetaminophen-Codeine] Other (See Comments)    "doesn't make me feel right"  . Tylenol [Acetaminophen] Other (See Comments)    "Doesn't feel right"    Patient Measurements: Height: 5\' 4"  (162.6 cm) Weight: 159 lb 8 oz (72.3 kg) IBW/kg (Calculated) : 54.7  Heparin dosing wt: 69.4kg  Vital Signs: Temp: 97.5 F (36.4 C) (07/08 0540) Temp Source: Oral (07/08 0540) BP: 137/55 (07/08 0540) Pulse Rate: 84 (07/08 0540)  Labs:  Recent Labs  03/24/17 5462 03/24/17 1228 03/24/17 1744  03/25/17 0609 03/25/17 1349 03/26/17 0310  HGB 8.5*  --   --   --  8.0*  --  8.0*  HCT 28.5*  --   --   --  26.1*  --  26.1*  PLT 310  --   --   --  279  --  272  HEPARINUNFRC  --  0.30  --   < > 0.65 0.50 0.36  CREATININE 2.61*  --   --   --  3.14*  --  3.33*  TROPONINI 3.86* 3.92* 2.69*  --   --   --   --   < > = values in this interval not displayed.  Estimated Creatinine Clearance: 16.8 mL/min (A) (by C-G formula based on SCr of 3.33 mg/dL (H)).   Medical History: Past Medical History:  Diagnosis Date  . Anemia   . Anxiety    takes Xanax prn  . Arthritis   . CKD (chronic kidney disease), stage IV ([place])   . Coronary artery disease   . GERD (gastroesophageal reflux disease)   . H/O hiatal hernia   . Headache(784.0)    when b/p is elevated  . Hemorrhoids   . Hyperlipidemia    takes Simvastatin daily  . Hypertension    takes Amlodipine/HCTZ/Losartan daily  . Migraines   . Myocardial infarction ([place]) 1990's  . Peripheral vascular disease ([place])   . PONV (postoperative nausea and vomiting)   . Stroke [facility])    TIA history  . Type II diabetes mellitus (HCC)    takes Glucovance and Januvia daily  . Vertigo    takes  Ativert prn    Assessment: 66 y/o F here with CP/elevated troponins on heparin. Of note, pt with CKD IV and with bump in SCr 2.5> 3.3. Hgb low-stable and platelets normal. Heparin level therapeutic at 0.36. No overt s/s bleeding noted.   Goal of Therapy:  Heparin level 0.3-0.7 units/ml Monitor platelets by anticoagulation protocol: Yes   Plan:  Continue heparin gtt at 950 units/hr  Heparin level and CBC daily  Monitor for s/s bleeding  Bonnita Nasuti Pharm.D. CPP, BCPS Clinical Pharmacist [phone] 03/26/2017 12:16 PM

## 2017-03-27 LAB — IRON AND TIBC
Iron: 66 ug/dL (ref 28–170)
SATURATION RATIOS: 20 % (ref 10.4–31.8)
TIBC: 328 ug/dL (ref 250–450)
UIBC: 262 ug/dL

## 2017-03-27 LAB — BASIC METABOLIC PANEL
Anion gap: 9 (ref 5–15)
BUN: 51 mg/dL — ABNORMAL HIGH (ref 6–20)
CO2: 22 mmol/L (ref 22–32)
CREATININE: 3.28 mg/dL — AB (ref 0.44–1.00)
Calcium: 8.7 mg/dL — ABNORMAL LOW (ref 8.9–10.3)
Chloride: 107 mmol/L (ref 101–111)
GFR calc non Af Amer: 14 mL/min — ABNORMAL LOW (ref >60)
GFR, EST AFRICAN AMERICAN: 16 mL/min — AB (ref >60)
GLUCOSE: 93 mg/dL (ref 65–99)
Potassium: 4.1 mmol/L (ref 3.5–5.1)
Sodium: 138 mmol/L (ref 135–145)

## 2017-03-27 LAB — VITAMIN B12: Vitamin B-12: 334 pg/mL (ref 180–914)

## 2017-03-27 LAB — GLUCOSE, CAPILLARY
GLUCOSE-CAPILLARY: 103 mg/dL — AB (ref 65–99)
GLUCOSE-CAPILLARY: 160 mg/dL — AB (ref 65–99)
Glucose-Capillary: 181 mg/dL — ABNORMAL HIGH (ref 65–99)
Glucose-Capillary: 132 mg/dL — ABNORMAL HIGH (ref 65–99)

## 2017-03-27 LAB — HEPARIN LEVEL (UNFRACTIONATED): Heparin Unfractionated: 0.43 IU/mL (ref 0.30–0.70)

## 2017-03-27 NOTE — Care Management Note (Signed)
Case Management Note  Patient Details  Name: Jenna Brown MRN: [id number] Date of Birth: 17-Sep-1951  Subjective/Objective:  Pt presented for Nstemi with prior hx of CAD. CKD- stage 4 Cr up to 3.33 baseline on admission 2.61. IV lasix is being held @ this time. Renal to consult. Pt continues on IV Heparin Gtt.                 Action/Plan: CM will continue to monitor for disposition needs.   Expected Discharge Date:                  Expected Discharge Plan:  Home/Self Care  In-House Referral:  NA  Discharge planning Services  CM Consult  Post Acute Care Choice:  NA Choice offered to:  NA  DME Arranged:  N/A DME Agency:  NA  HH Arranged:  NA HH Agency:  NA  Status of Service:  Completed, signed off  If discussed at [place] of Stay Meetings, dates discussed:  03-28-17  Additional Comments:  Bethena Roys, RN 03/27/2017, 3:14 PM

## 2017-03-27 NOTE — Progress Notes (Signed)
Progress Note  Patient Name: Jenna Brown Date of Encounter: 03/27/2017  Primary Cardiologist: Dr. Haroldine Laws  Subjective   Chest pain and dyspnea free.   Inpatient Medications    Scheduled Meds: . acetaminophen  650 mg Oral Q8H  . aspirin  81 mg Oral Daily  . carvedilol  12.5 mg Oral BID WC  . hydrALAZINE  25 mg Oral Q8H  . insulin aspart  5 Units Subcutaneous TID AC  . insulin glargine  25 Units Subcutaneous Daily  . isosorbide mononitrate  60 mg Oral Daily  . pravastatin  40 mg Oral Daily  . ticagrelor  90 mg Oral BID   Continuous Infusions: . heparin 950 Units/hr (03/26/17 1945)   PRN Meds: ALPRAZolam, nitroGLYCERIN, ondansetron (ZOFRAN) IV, senna   Vital Signs    Vitals:   03/26/17 1449 03/26/17 2049 03/27/17 0455 03/27/17 0719  BP: (!) 110/92 137/69  (!) 141/71  Pulse: 79 78  70  Resp: (!) 21 16  14   Temp: (!) 97.4 F (36.3 C) 98.5 F (36.9 C)  98 F (36.7 C)  TempSrc: Oral Oral  Oral  SpO2: 100% 99%  100%  Weight:   156 lb (70.8 kg)   Height:        Intake/Output Summary (Last 24 hours) at 03/27/17 0729 Last data filed at 03/27/17 0457  Gross per 24 hour  Intake           226.42 ml  Output             2500 ml  Net         -2273.58 ml   Filed Weights   03/24/17 0601 03/26/17 0557 03/27/17 0455  Weight: 158 lb 6.4 oz (71.8 kg) 159 lb 8 oz (72.3 kg) 156 lb (70.8 kg)    Telemetry    SR with PVCS- Personally Reviewed  ECG    None today   Physical Exam   GEN: No acute distress.   Neck: No JVD Cardiac: RRR, no murmurs, rubs, or gallops.  Respiratory: Diminished breath sound at bases GI: Soft, nontender, non-distended  MS: Trace R Le edema. Right AKA.  Neuro:  Nonfocal  Psych: Normal affect   Labs    Chemistry Recent Labs Lab 03/24/17 0009  03/25/17 0609 03/26/17 0310 03/27/17 0224  NA 138  < > 140 137 138  K 4.7  < > 4.5 4.3 4.1  CL 109  < > 110 107 107  CO2 22  < > 22 21* 22  GLUCOSE 207*  < > 112* 139* 93  BUN 42*  < >  47* 52* 51*  CREATININE 2.56*  < > 3.14* 3.33* 3.28*  CALCIUM 8.7*  < > 8.4* 8.4* 8.7*  PROT 6.3*  --   --   --   --   ALBUMIN 3.1*  --   --   --   --   AST 18  --   --   --   --   ALT 14  --   --   --   --   ALKPHOS 91  --   --   --   --   BILITOT 0.5  --   --   --   --   GFRNONAA 19*  < > 15* 14* 14*  GFRAA 22*  < > 17* 16* 16*  ANIONGAP 7  < > 8 9 9   < > = values in this interval not displayed.   Hematology Recent Labs  Lab 03/24/17 0652 03/25/17 0609 03/26/17 0310  WBC 10.9* 8.4 9.1  RBC 3.44* 3.13* 3.12*  HGB 8.5* 8.0* 8.0*  HCT 28.5* 26.1* 26.1*  MCV 82.8 83.4 83.7  MCH 24.7* 25.6* 25.6*  MCHC 29.8* 30.7 30.7  RDW 13.3 13.4 13.3  PLT 310 279 272    Cardiac Enzymes Recent Labs Lab 03/24/17 0200 03/24/17 0652 03/24/17 1228 03/24/17 1744  TROPONINI 1.44* 3.86* 3.92* 2.69*   No results for input(s): TROPIPOC in the last 168 hours.   BNPNo results for input(s): BNP, PROBNP in the last 168 hours.   DDimer No results for input(s): DDIMER in the last 168 hours.   Radiology    No results found.  Cardiac Studies   Echo (03/24/17):  Study Conclusions  - Left ventricle: LVEF is approximately 45 to 50% with mild hypokinesis of the distal inferior/inferoseptal and apical walls. The cavity size was normal. Wall thickness was increased in a pattern of mild LVH. Doppler parameters are consistent with abnormal left ventricular relaxation (grade 1 diastolic dysfunction). - Aortic valve: AV is thickened, calcified In the parasternal view there is an nodular echo bright segment (18 x 7 mm) May represent nodular thickening of valve Not convincing for discrete mass. It appears to be present on TTE in April 2018 TEE after that showed no abnormalitiy to AV. - Mitral valve: Calcified annulus. Mildly thickened leaflets .   Patient Profile     66 y.o. female with HLD, prior CVA, HTN, DM, severe multi-vessel CAD, ischemic cardiomyopathy, stage 4 CKD,  PAD s/p right AKA admitted with chest pain. She has undergone cardiac cath in July 2016 and was found to have severe triple vessel CAD. She was not felt to be a candidate for CABG at that time. PCI was discussed but given cardiogenic shock at that time, her medical therapy was optimized and cardiac cath was not repeated.   Most recently admitted 4/8 -> 01/03/2017 with hypertensive emergency and AKI. BP up to 225/98 Troponin max to 4.62 felt to be demand ischemia. Started on Nitro + heparin gtt. Lasix and losartan held in AKI.  Renal consulted. Medications heavily adjusted.    Assessment & Plan    1. NSTEMI with prior hx of CAD - Peak of troponin 3.92, now trending down. EKG with sinus, TWI in lateral leads that are chronic. Scr worsen form admission to 3.33 from 2. 56. Lasix held yesterday --> Scr minimally improved to 3.28 today.  - Previously treated medically as above. ? Cath this admission.  - continue ASA, Brillinta, IV heparin, Coreg and statin. Imdur increased to 60mg  qd this admission. Remained chest pain free.   2. Acute on CKD, stage IV - as above  3. ICM - LVEF is stable to 45-50% by echo. D/C ARB. Held lasix. Continue coreg and hydralazine. Watch for fluid overload.   4. HTN - BP relatively controlled  5. HLD - Continue statin   6. DM  Signed, Caroline Matters, PA  03/27/2017, 7:29 AM

## 2017-03-27 NOTE — Progress Notes (Signed)
ANTICOAGULATION CONSULT NOTE - Follow Up Consult  Pharmacy Consult for heparin Indication: chest pain/ACS  Allergies  Allergen Reactions  . Plavix [Clopidogrel Bisulfate] Itching  . Codeine Other (See Comments)    "makes me feel strange"  . Tylenol With Codeine #3 [Acetaminophen-Codeine] Other (See Comments)    "doesn't make me feel right"  . Tylenol [Acetaminophen] Other (See Comments)    "Doesn't feel right"     Patient Measurements: Height: 5\' 4"  (162.6 cm) Weight: 156 lb (70.8 kg) IBW/kg (Calculated) : 54.7   Vital Signs: Temp: 98 F (36.7 C) (07/09 0719) Temp Source: Oral (07/09 0719) BP: 141/71 (07/09 0719) Pulse Rate: 70 (07/09 0719)  Labs:  Recent Labs  03/24/17 1228 03/24/17 1744  03/25/17 0609 03/25/17 1349 03/26/17 0310 03/27/17 0224  HGB  --   --   --  8.0*  --  8.0*  --   HCT  --   --   --  26.1*  --  26.1*  --   PLT  --   --   --  279  --  272  --   HEPARINUNFRC 0.30  --   < > 0.65 0.50 0.36 0.43  CREATININE  --   --   --  3.14*  --  3.33* 3.28*  TROPONINI 3.92* 2.69*  --   --   --   --   --   < > = values in this interval not displayed.  Estimated Creatinine Clearance: 16.9 mL/min (A) (by C-G formula based on SCr of 3.28 mg/dL (H)).   Medications:  Infusions:  . heparin 950 Units/hr (03/27/17 8206)    Assessment: 66 y/o F admitted for NSTEMI w/ hx of CAD, CKD IV on heparin. HGB low but stable, platelets normal. Heparin level therapeutic at 0.43. No bleeding noted.  Goal of Therapy:  Heparin level 0.3-0.7 units/ml Monitor platelets by anticoagulation protocol: Yes   Plan:  Continue heparin at 950 units/hr Daily heparin level/CBC Monitor for s/sx bleeding F/u Cardiology plans - cath this admission   Charlene Brooke, PharmD PGY1 [facility] Resident Pager: [phone] 03/27/2017,11:46 AM

## 2017-03-28 DIAGNOSIS — E1151 Type 2 diabetes mellitus with diabetic peripheral angiopathy without gangrene: Secondary | ICD-10-CM

## 2017-03-28 DIAGNOSIS — D508 Other iron deficiency anemias: Secondary | ICD-10-CM

## 2017-03-28 LAB — FOLATE RBC
FOLATE, HEMOLYSATE: 331.5 ng/mL
FOLATE, RBC: 1326 ng/mL (ref >498)
Hematocrit: 25 % — ABNORMAL LOW (ref 34.0–46.6)

## 2017-03-28 LAB — URINALYSIS, ROUTINE W REFLEX MICROSCOPIC
BILIRUBIN URINE: NEGATIVE
GLUCOSE, UA: 50 mg/dL — AB
HGB URINE DIPSTICK: NEGATIVE
KETONES UR: NEGATIVE mg/dL
LEUKOCYTES UA: NEGATIVE
NITRITE: NEGATIVE
PROTEIN: 100 mg/dL — AB
Specific Gravity, Urine: 1.008 (ref 1.005–1.030)
pH: 6 (ref 5.0–8.0)

## 2017-03-28 LAB — BASIC METABOLIC PANEL
Anion gap: 10 (ref 5–15)
BUN: 52 mg/dL — AB (ref 6–20)
CALCIUM: 8.5 mg/dL — AB (ref 8.9–10.3)
CO2: 22 mmol/L (ref 22–32)
CREATININE: 3.11 mg/dL — AB (ref 0.44–1.00)
Chloride: 105 mmol/L (ref 101–111)
GFR calc Af Amer: 17 mL/min — ABNORMAL LOW (ref >60)
GFR, EST NON AFRICAN AMERICAN: 15 mL/min — AB (ref >60)
GLUCOSE: 167 mg/dL — AB (ref 65–99)
POTASSIUM: 4.5 mmol/L (ref 3.5–5.1)
SODIUM: 137 mmol/L (ref 135–145)

## 2017-03-28 LAB — HEPARIN LEVEL (UNFRACTIONATED)
HEPARIN UNFRACTIONATED: 0.28 [IU]/mL — AB (ref 0.30–0.70)
Heparin Unfractionated: 0.39 IU/mL (ref 0.30–0.70)

## 2017-03-28 LAB — GLUCOSE, CAPILLARY
GLUCOSE-CAPILLARY: 184 mg/dL — AB (ref 65–99)
Glucose-Capillary: 162 mg/dL — ABNORMAL HIGH (ref 65–99)
Glucose-Capillary: 122 mg/dL — ABNORMAL HIGH (ref 65–99)
Glucose-Capillary: 103 mg/dL — ABNORMAL HIGH (ref 65–99)

## 2017-03-28 LAB — CREATININE, URINE, RANDOM: Creatinine, Urine: 57.06 mg/dL

## 2017-03-28 LAB — SODIUM, URINE, RANDOM: SODIUM UR: 37 mmol/L

## 2017-03-28 LAB — FERRITIN: FERRITIN: 42 ng/mL (ref 11–307)

## 2017-03-28 MED ORDER — SODIUM CHLORIDE 0.9 % IV SOLN
510.0000 mg | Freq: Once | INTRAVENOUS | Status: AC
  Administered 2017-03-28: 510 mg via INTRAVENOUS
  Filled 2017-03-28: qty 17

## 2017-03-28 NOTE — Progress Notes (Signed)
ANTICOAGULATION CONSULT NOTE - Follow Up Consult  Pharmacy Consult for heparin Indication: chest pain/ACS  Allergies  Allergen Reactions  . Plavix [Clopidogrel Bisulfate] Itching  . Codeine Other (See Comments)    "makes me feel strange"  . Tylenol With Codeine #3 [Acetaminophen-Codeine] Other (See Comments)    "doesn't make me feel right"  . Tylenol [Acetaminophen] Other (See Comments)    "Doesn't feel right"     Patient Measurements: Height: 5\' 4"  (162.6 cm) Weight: 156 lb (70.8 kg) IBW/kg (Calculated) : 54.7   Vital Signs: Temp: 98.8 F (37.1 C) (07/09 2019) Temp Source: Oral (07/09 2019) BP: 114/88 (07/09 2019) Pulse Rate: 78 (07/09 2019)  Labs:  Recent Labs  03/25/17 0609  03/26/17 0310 03/27/17 0224 03/28/17 0250  HGB 8.0*  --  8.0*  --   --   HCT 26.1*  --  26.1*  --   --   PLT 279  --  272  --   --   HEPARINUNFRC 0.65  < > 0.36 0.43 0.28*  CREATININE 3.14*  --  3.33* 3.28* 3.11*  < > = values in this interval not displayed.  Estimated Creatinine Clearance: 17.9 mL/min (A) (by C-G formula based on SCr of 3.11 mg/dL (H)).   Medications:  Infusions:  . heparin 950 Units/hr (03/27/17 8546)    Assessment: 66 y/o F admitted for NSTEMI w/ hx of CAD, CKD IV on heparin. HGB low but stable, platelets normal (last CBC 7/8). Heparin level slightly subtherapeutic at 0.28. No issues with heparin gtt per RN. No bleeding noted.  Goal of Therapy:  Heparin level 0.3-0.7 units/ml Monitor platelets by anticoagulation protocol: Yes   Plan:  Increase heparin gtt to 1000 units/hr Heparin level in 8 hrs  Daily heparin level/CBC Monitor for s/sx bleeding F/u Cardiology plans - possible cath this admission  Argie Ramming, PharmD Clinical Pharmacist 03/28/17 4:55 AM

## 2017-03-28 NOTE — Progress Notes (Signed)
Pt complained of 3/10 chest pressure. VS Stable, NTG SL given x1 with complete relief. Will cont to monitor. Jessie Foot

## 2017-03-28 NOTE — Progress Notes (Signed)
Progress Note  Patient Name: Jenna Brown Date of Encounter: 03/28/2017  Primary Cardiologist: Bensimhon  Subjective   Slowly improving, but continues to have chest discomfort on increased medications. One brief episode of chest pain last night. No further episodes  No shortness of breath.  Inpatient Medications    Scheduled Meds: . acetaminophen  650 mg Oral Q8H  . aspirin  81 mg Oral Daily  . carvedilol  12.5 mg Oral BID WC  . hydrALAZINE  25 mg Oral Q8H  . insulin aspart  5 Units Subcutaneous TID AC  . insulin glargine  25 Units Subcutaneous Daily  . isosorbide mononitrate  60 mg Oral Daily  . pravastatin  40 mg Oral Daily  . ticagrelor  90 mg Oral BID   Continuous Infusions: . heparin 1,000 Units/hr (03/28/17 1210)   PRN Meds: ALPRAZolam, nitroGLYCERIN, ondansetron (ZOFRAN) IV, senna   Vital Signs    Vitals:   03/27/17 1434 03/27/17 2019 03/28/17 0541 03/28/17 1424  BP: (!) 127/47 114/88 (!) 147/58 (!) 136/36  Pulse: 72 78 75 78  Resp:  16 16   Temp: 98.1 F (36.7 C) 98.8 F (37.1 C) 98.8 F (37.1 C) 98.7 F (37.1 C)  TempSrc: Oral Oral Oral Oral  SpO2: 99% 100% 97% 95%  Weight:   165 lb 4.8 oz (75 kg)   Height:        Intake/Output Summary (Last 24 hours) at 03/28/17 1445 Last data filed at 03/28/17 0900  Gross per 24 hour  Intake              440 ml  Output              500 ml  Net              -60 ml   Filed Weights   03/26/17 0557 03/27/17 0455 03/28/17 0541  Weight: 159 lb 8 oz (72.3 kg) 156 lb (70.8 kg) 165 lb 4.8 oz (75 kg)    Telemetry    SR with PVCs - Personally Reviewed  ECG    N/A - Personally Reviewed  Physical Exam   General: Well developed, well nourished,older AA female appearing in no acute distress. Head: Normocephalic, atraumatic.  Neck: Supple without bruits, JVD. Lungs:  Resp regular and unlabored, CTA. Heart: RRR, S1, S2, no S3, S4, or murmur; no rub. Abdomen: Soft/NT/ND/NABS. No HSM Extremities: No clubbing,  cyanosis, edema. Right AKA Neuro: Alert and oriented X 3. Moves all extremities spontaneously. Psych: Normal affect. She does not seem to understand everything that is being discussed however.  Labs    Chemistry Recent Labs Lab 03/24/17 0009  03/26/17 0310 03/27/17 0224 03/28/17 0250  NA 138  < > 137 138 137  K 4.7  < > 4.3 4.1 4.5  CL 109  < > 107 107 105  CO2 22  < > 21* 22 22  GLUCOSE 207*  < > 139* 93 167*  BUN 42*  < > 52* 51* 52*  CREATININE 2.56*  < > 3.33* 3.28* 3.11*  CALCIUM 8.7*  < > 8.4* 8.7* 8.5*  PROT 6.3*  --   --   --   --   ALBUMIN 3.1*  --   --   --   --   AST 18  --   --   --   --   ALT 14  --   --   --   --   ALKPHOS 91  --   --   --   --  BILITOT 0.5  --   --   --   --   GFRNONAA 19*  < > 14* 14* 15*  GFRAA 22*  < > 16* 16* 17*  ANIONGAP 7  < > 9 9 10   < > = values in this interval not displayed.   Hematology Recent Labs Lab 03/24/17 5956 03/25/17 0609 03/26/17 0310  WBC 10.9* 8.4 9.1  RBC 3.44* 3.13* 3.12*  HGB 8.5* 8.0* 8.0*  HCT 28.5* 26.1* 26.1*  MCV 82.8 83.4 83.7  MCH 24.7* 25.6* 25.6*  MCHC 29.8* 30.7 30.7  RDW 13.3 13.4 13.3  PLT 310 279 272    Cardiac Enzymes Recent Labs Lab 03/24/17 0200 03/24/17 0652 03/24/17 1228 03/24/17 1744  TROPONINI 1.44* 3.86* 3.92* 2.69*   No results for input(s): TROPIPOC in the last 168 hours.   BNPNo results for input(s): BNP, PROBNP in the last 168 hours.   DDimer No results for input(s): DDIMER in the last 168 hours.    Radiology    No results found.  Cardiac Studies    Echo (03/24/17):EF 45-50%. Mild HK of distal inferior-inferoseptal and apical walls. GR 1 DD. Calcified aortic valve - with nodular echo bright segment also previously noted on TTE but not TEE.   Patient Profile     66 y.o. female with HLD, prior CVA, HTN, DM, severe multi-vessel CAD, ischemic cardiomyopathy, stage 4 CKD, PAD s/p right AKA admitted with chest pain. She has undergone cardiac cath in July 2016 and  was found to have severe triple vessel CAD. She was not felt to be a candidate for CABG at that time. PCI was discussed but given cardiogenic shock at that time, her medical therapy was optimized and cardiac cath was not repeated.   Most recently admitted 4/8 ->01/03/2017 with hypertensive emergency and AKI. BP up to 225/98 Troponin max to 4.62 felt to be demand ischemia. Started on Nitro + heparin gtt. Lasix and losartan held in AKI. Renal consulted. Medications heavily adjusted.   Assessment & Plan    1. NSTEMI with prior hx of CAD: Peak of troponin 3.92, now trending down. EKG with sinus, TWI in lateral leads that are chronic. Scr worsen form admission to 3.33 from 2. 56. Lasix held --> Scr 3.11, LOV56 today.  - Previously treated medically as above. At this time would favor continuing medical therapy as she has only had one episode of chest discomfort last evening.  - continue ASA, Brillinta, Coreg and statin. Imdur increased to 60mg  qd this admission. Will continue IV heparin for now until more definite plan established.   2. Acute on CKD, stage IV - as above, nephrology consulted by Dr. Radford Pax yesterday  3. ICM - LVEF is stable to 45-50% by echo. D/C ARB. Held lasix.  - Continue coreg and hydralazine. No signs of volume overload.    4. HTN - BP relatively controlled  5. HLD - Continue statin   6. DM  7. Anemia: Hgb stable at 8.0. Iron, Ferritin normal. FOBT pending.   Signed, Reino Bellis, NP  03/28/2017, 2:45 PM     I have seen, examined and evaluated the patient this PM along with Reino Bellis, NP-C.  After reviewing all the available data and chart, we discussed the patients laboratory, study & physical findings as well as symptoms in detail. I agree with her findings, examination as well as impression recommendations as per our discussion.    Attending adjustments noted in italics.   Carmesha is slowly progressing, she  remains on IV heparin which I think we can  discontinue now. I can see how she does off of IV heparin with the medications titrated up. We need to fully assess whether she is actively having anginal symptoms. Hopefully if she is stable tomorrow on increased medications we can consider discharge tomorrow. I spent about a half an hour talking with her detail about needing to have ago to plan or she did continue to have symptoms off of heparin. In that regard, I have asked nephrology to see the patient to discuss risks of redo catheterization and even potentially staged interventions versus potentially reconsidering CABG. She would like to try to do medical management first which I agree with.  I think there is a likelihood that she will return with a similar presentation in the future. We cannot keep her on IV heparin forever. I will need to review her cardiac films from her prior catheterization several years ago and consider potential options for reconsidering CABG versus PCI. I would not consider cardiac catheterization unless we felt like there was an option for one of those 2. At present, her elevated creatinine is borderline prohibitive risk for going forward unless we are forced by her intractable ischemic cardiac pain.  Plan for now is to continue gradually titrating up medications. Stop IV heparin. However a malignant hallway tomorrow to determine if she is having any significant recurrence of pain. If she does not, we can consider discharge as early as tomorrow. However if there is recurrence of symptoms, we then need to bring to the forefront the question of if we should or not proceed with invasive evaluation which could potentially put her on dialysis.  I spent well over 45 minutes myself with the patient along with the 25 minutes spent by the PA. Over 60 minutes spent. Greater than 50% of the time was spent in direct patient counseling.   Glenetta Hew, M.D., M.S. Interventional Cardiologist   Pager # [phone] Phone #  [phone] [address] [place], [place] [address]

## 2017-03-28 NOTE — Consult Note (Signed)
Reason for Consult: Acute kidney injury on CKD stage IV Referring Physician: Glori Bickers M.D.  HPI:  66 year old woman with past medical history significant for multi-vessel coronary artery disease (poor candidate for surgical or percutaneous revascularization), insulin-dependent type 2 diabetes mellitus, hypertension, dyslipidemia, peripheral vascular disease status post right above-knee amputation and what appears to be chronic kidney disease stage IV at baseline (presumptively steady-state creatinine ~2.6) who follows up with Dr.Zekan (Nephrology) in [facility]. She was seen in consultation 2 months ago when she presented with hypertensive crisis/volume overload and acute kidney injury on chronic kidney disease stage with evidence of progression to stage IV. At the time of discharge from that hospitalization, creatinine was 3.3.   She was admitted to the hospital 4 days ago with worsening substernal chest pain radiating down her left arm and similar to a previous acute MIs that did not improve with sublingual nitroglycerin. Some questionable compliance noted at the time of admission with her antithrombotic therapy (Brilinta).   Nephrology is consulted to opine on her elevated creatinine that has not improved after discontinuation of diuresis with the possibility of her needing coronary angiogram. She has not had any significant episodes of hypotension, has not received any iodinated intravenous contrast and does not take NSAIDs. She was previously on losartan 100 mg daily that appears to have been discontinued on admission.   01/02/2017  01/18/2017 1 03/24/2017  03/25/2017  03/26/2017  03/27/2017  03/28/2017   BUN 58 (H) 80 (H) 42 (H) 47 (H) 52 (H) 51 (H) 52 (H)  Creatinine 3.28 (H) 3.36 (H) 2.56 (H) 3.14 (H) 3.33 (H) 3.28 (H) 3.11 (H)      Past Medical History:  Diagnosis Date  . Anemia   . Anxiety    takes Xanax prn  . Arthritis   . CKD (chronic kidney disease), stage IV ([place])   . Coronary  artery disease   . GERD (gastroesophageal reflux disease)   . H/O hiatal hernia   . Headache(784.0)    when b/p is elevated  . Hemorrhoids   . Hyperlipidemia    takes Simvastatin daily  . Hypertension    takes Amlodipine/HCTZ/Losartan daily  . Migraines   . Myocardial infarction ([place]) 1990's  . Peripheral vascular disease ([place])   . PONV (postoperative nausea and vomiting)   . Stroke [facility])    TIA history  . Type II diabetes mellitus (HCC)    takes Glucovance and Januvia daily  . Vertigo    takes Ativert prn    Past Surgical History:  Procedure Laterality Date  . ABDOMINAL AORTAGRAM N/A 02/29/2012   Procedure: ABDOMINAL Maxcine Ham;  Surgeon: [name], MD;  Location: [facility] CATH LAB;  Service: Cardiovascular;  Laterality: N/A;  . AMPUTATION  05/10/2012   Procedure: AMPUTATION ABOVE KNEE;  Surgeon: [name], MD;  Location: [facility] OR;  Service: Vascular;  Laterality: Right;   open right groin wound noted  . aortogram  02/2012  . CARDIAC CATHETERIZATION N/A 04/13/2015   Procedure: Right/Left Heart Cath and Coronary Angiography;  Surgeon: [name], MD;  Location: [facility] CV LAB;  Service: Cardiovascular;  Laterality: N/A;  . CLEFT PALATE REPAIR     several  . COLONOSCOPY    . DILATION AND CURETTAGE OF UTERUS    . FEMORAL-POPLITEAL BYPASS GRAFT  04/05/2012   Procedure: BYPASS GRAFT FEMORAL-POPLITEAL ARTERY;  Surgeon: [name], MD;  Location: [place];  Service: Vascular;  Laterality: Right;  REVISION  . FEMORAL-TIBIAL BYPASS GRAFT  03/29/2012   Procedure: BYPASS GRAFT FEMORAL-TIBIAL ARTERY;  Surgeon: [name], MD;  Location: [facility] OR;  Service: Vascular;  Laterality: Right;  Right femoral to Posterior Tibialis with composite graft of 64mm x 80 cm ringed gortex graft and saphenous vein  ,intraoperative arteriogram  . FEMOROPOPLITEAL THROMBECTOMY / EMBOLECTOMY  05/09/2012  . MYOMECTOMY    . PR VEIN BYPASS GRAFT,AORTO-FEM-POP  05/27/11   Right SFA-Below knee Pop BP  .  TEE WITHOUT CARDIOVERSION N/A 01/03/2017   Procedure: TRANSESOPHAGEAL ECHOCARDIOGRAM (TEE);  Surgeon: Skeet Latch, MD;  Location: [facility] ENDOSCOPY;  Service: Cardiovascular;  Laterality: N/A;  . TONSILLECTOMY    . TUBAL LIGATION  ~ 1990    Family History  Problem Relation Age of Onset  . Cancer Mother        breast  . Diabetes Father     Social History:  reports that she quit smoking about 5 years ago. Her smoking use included Cigarettes. She has a 10.00 pack-year smoking history. She has never used smokeless tobacco. She reports that she does not drink alcohol or use drugs.  Allergies:  Allergies  Allergen Reactions  . Plavix [Clopidogrel Bisulfate] Itching  . Codeine Other (See Comments)    "makes me feel strange"  . Tylenol With Codeine #3 [Acetaminophen-Codeine] Other (See Comments)    "doesn't make me feel right"  . Tylenol [Acetaminophen] Other (See Comments)    "Doesn't feel right"     Medications:  Scheduled: . acetaminophen  650 mg Oral Q8H  . aspirin  81 mg Oral Daily  . carvedilol  12.5 mg Oral BID WC  . hydrALAZINE  25 mg Oral Q8H  . insulin aspart  5 Units Subcutaneous TID AC  . insulin glargine  25 Units Subcutaneous Daily  . isosorbide mononitrate  60 mg Oral Daily  . pravastatin  40 mg Oral Daily  . ticagrelor  90 mg Oral BID    BMP Latest Ref Rng & Units 03/28/2017 03/27/2017 03/26/2017  Glucose 65 - 99 mg/dL 167(H) 93 139(H)  BUN 6 - 20 mg/dL 52(H) 51(H) 52(H)  Creatinine 0.44 - 1.00 mg/dL 3.11(H) 3.28(H) 3.33(H)  Sodium 135 - 145 mmol/L 137 138 137  Potassium 3.5 - 5.1 mmol/L 4.5 4.1 4.3  Chloride 101 - 111 mmol/L 105 107 107  CO2 22 - 32 mmol/L 22 22 21(L)  Calcium 8.9 - 10.3 mg/dL 8.5(L) 8.7(L) 8.4(L)   CBC Latest Ref Rng & Units 03/27/2017 03/26/2017 03/25/2017  WBC 4.0 - 10.5 Brown/uL - 9.1 8.4  Hemoglobin 12.0 - 15.0 g/dL - 8.0(L) 8.0(L)  Hematocrit 34.0 - 46.6 % 25.0(L) 26.1(L) 26.1(L)  Platelets 150 - 400 Brown/uL - 272 279     No results  found.  Review of Systems  Constitutional: Positive for chills. Negative for fever and malaise/fatigue.  HENT: Negative.   Eyes: Negative.   Respiratory: Negative for cough and sputum production.   Cardiovascular: Positive for chest pain. Negative for palpitations and orthopnea.  Gastrointestinal: Negative.   Genitourinary: Negative.   Musculoskeletal: Negative.   Skin: Negative.    Blood pressure (!) 136/36, pulse 78, temperature 98.7 F (37.1 C), temperature source Oral, resp. rate 16, height 5\' 4"  (1.626 m), weight 75 kg (165 lb 4.8 oz), SpO2 95 %. Physical Exam  Nursing note and vitals reviewed. Constitutional: She is oriented to person, place, and time. She appears well-developed and well-nourished. No distress.  HENT:  Head: Normocephalic and atraumatic.  Right Ear: External ear normal.  Mouth/Throat: Oropharynx is  clear and moist.  Eyes: Conjunctivae and EOM are normal. Pupils are equal, round, and reactive to light. No scleral icterus.  Neck: Normal range of motion. Neck supple. No JVD present.  Cardiovascular: Normal rate, regular rhythm and normal heart sounds.   No murmur heard. Respiratory: Effort normal and breath sounds normal. She has no wheezes. She has no rales.  GI: Soft. Bowel sounds are normal. There is no tenderness. There is no rebound.  Musculoskeletal: She exhibits edema.  Trace left ankle edema, s/p right AKA  Neurological: She is alert and oriented to person, place, and time.  Skin: Skin is warm and dry.    Assessment/Plan: 1.Acute kidney injury on chronic kidney disease stage IV: Possibly hemodynamically mediated with slow gradual downtrend of creatinine noted. No acute electrolyte abnormalities or acute indications for dialysis noted. Agree with ongoing supportive management of her hypertension and efforts at trying to avoid coronary angiogram (previous documentation in 2016 suggests that she is not a candidate for surgical or percutaneous targets  anyway). She has an outpatient nephrologist for continued follow-up upon discharge (Dr. Joesph July, [facility]). Will check a urinalysis and urine electrolytes. 2. NSTEMI with prior history of coronary artery disease: Ongoing medical management at this time with improvement so far noted with medication adjustments including up titration of isosorbide. 3. Hypertension: Blood pressures relatively controlled at this time, off diuretics temporarily. 4. Anemia: Possibly anemia of chronic kidney disease-significant iron deficiency with a ferritin of 42 and low iron saturation of 20%, will give intravenous iron.   Jenna Brown. 03/28/2017, 6:32 PM

## 2017-03-28 NOTE — Progress Notes (Signed)
ANTICOAGULATION CONSULT NOTE - Follow Up Consult  Pharmacy Consult for Heparin Indication: chest pain/ACS  Allergies  Allergen Reactions  . Plavix [Clopidogrel Bisulfate] Itching  . Codeine Other (See Comments)    "makes me feel strange"  . Tylenol With Codeine #3 [Acetaminophen-Codeine] Other (See Comments)    "doesn't make me feel right"  . Tylenol [Acetaminophen] Other (See Comments)    "Doesn't feel right"     Patient Measurements: Height: 5\' 4"  (162.6 cm) Weight: 165 lb 4.8 oz (75 kg) IBW/kg (Calculated) : 54.7 Heparin Dosing Weight:    Vital Signs: Temp: 98.7 F (37.1 C) (07/10 1424) Temp Source: Oral (07/10 1424) BP: 136/36 (07/10 1424) Pulse Rate: 78 (07/10 1424)  Labs:  Recent Labs  03/26/17 0310 03/27/17 0224 03/28/17 0250 03/28/17 1354  HGB 8.0*  --   --   --   HCT 26.1*  --   --   --   PLT 272  --   --   --   HEPARINUNFRC 0.36 0.43 0.28* 0.39  CREATININE 3.33* 3.28* 3.11*  --     Estimated Creatinine Clearance: 18.4 mL/min (A) (by C-G formula based on SCr of 3.11 mg/dL (H)).  Assessment: Anticoag: Hep for CP/ACS, CBC low, stable (HGB ~8, PLT 272). AM HL 0.28 slightly low>0.39 on recheck now in goal. CBC not done this AM. Some CP overnight.   Goal of Therapy:  Heparin level 0.3-0.7 units/ml Monitor platelets by anticoagulation protocol: Yes   Plan:  Heparin 1000 units/hr Daily heparin level/CBC   Yanitza Shvartsman S. Alford Highland, PharmD, BCPS Clinical Staff Pharmacist Pager [phone]  Eilene Ghazi Stillinger 03/28/2017,3:09 PM

## 2017-03-28 NOTE — Care Management Important Message (Signed)
Important Message  Patient Details  Name: Jenna Brown MRN: [id number] Date of Birth: June 02, 1951   Medicare Important Message Given:  Yes    Orbie Pyo 03/28/2017, 2:12 PM

## 2017-03-29 ENCOUNTER — Encounter (HOSPITAL_COMMUNITY): Payer: Medicare Other | Admitting: Internal Medicine

## 2017-03-29 LAB — CBC
HCT: 23.5 % — ABNORMAL LOW (ref 36.0–46.0)
HEMATOCRIT: 27.1 % — AB (ref 36.0–46.0)
HEMOGLOBIN: 8.6 g/dL — AB (ref 12.0–15.0)
Hemoglobin: 7.2 g/dL — ABNORMAL LOW (ref 12.0–15.0)
MCH: 25.3 pg — AB (ref 26.0–34.0)
MCH: 26.8 pg (ref 26.0–34.0)
MCHC: 30.6 g/dL (ref 30.0–36.0)
MCHC: 31.7 g/dL (ref 30.0–36.0)
MCV: 82.5 fL (ref 78.0–100.0)
MCV: 84.4 fL (ref 78.0–100.0)
PLATELETS: 262 10*3/uL (ref 150–400)
Platelets: 260 10*3/uL (ref 150–400)
RBC: 2.85 MIL/uL — AB (ref 3.87–5.11)
RBC: 3.21 MIL/uL — AB (ref 3.87–5.11)
RDW: 13.2 % (ref 11.5–15.5)
RDW: 13.7 % (ref 11.5–15.5)
WBC: 7.7 10*3/uL (ref 4.0–10.5)
WBC: 8.6 10*3/uL (ref 4.0–10.5)

## 2017-03-29 LAB — RENAL FUNCTION PANEL
Albumin: 2.9 g/dL — ABNORMAL LOW (ref 3.5–5.0)
Anion gap: 8 (ref 5–15)
BUN: 54 mg/dL — ABNORMAL HIGH (ref 6–20)
CALCIUM: 8.9 mg/dL (ref 8.9–10.3)
CO2: 21 mmol/L — ABNORMAL LOW (ref 22–32)
CREATININE: 2.93 mg/dL — AB (ref 0.44–1.00)
Chloride: 108 mmol/L (ref 101–111)
GFR, EST AFRICAN AMERICAN: 19 mL/min — AB (ref >60)
GFR, EST NON AFRICAN AMERICAN: 16 mL/min — AB (ref >60)
Glucose, Bld: 116 mg/dL — ABNORMAL HIGH (ref 65–99)
Phosphorus: 4.5 mg/dL (ref 2.5–4.6)
Potassium: 4.5 mmol/L (ref 3.5–5.1)
SODIUM: 137 mmol/L (ref 135–145)

## 2017-03-29 LAB — GLUCOSE, CAPILLARY
GLUCOSE-CAPILLARY: 162 mg/dL — AB (ref 65–99)
GLUCOSE-CAPILLARY: 109 mg/dL — AB (ref 65–99)
GLUCOSE-CAPILLARY: 204 mg/dL — AB (ref 65–99)
Glucose-Capillary: 120 mg/dL — ABNORMAL HIGH (ref 65–99)

## 2017-03-29 LAB — PREPARE RBC (CROSSMATCH)

## 2017-03-29 MED ORDER — FUROSEMIDE 40 MG PO TABS
40.0000 mg | ORAL_TABLET | Freq: Every day | ORAL | Status: DC
  Administered 2017-03-29 – 2017-03-30 (×2): 40 mg via ORAL
  Filled 2017-03-29 (×2): qty 1

## 2017-03-29 MED ORDER — HYDRALAZINE HCL 50 MG PO TABS
50.0000 mg | ORAL_TABLET | Freq: Three times a day (TID) | ORAL | Status: DC
  Administered 2017-03-29 – 2017-03-30 (×4): 50 mg via ORAL
  Filled 2017-03-29 (×5): qty 1

## 2017-03-29 MED ORDER — SODIUM CHLORIDE 0.9 % IV SOLN
Freq: Once | INTRAVENOUS | Status: AC
  Administered 2017-03-29: 19:00:00 via INTRAVENOUS

## 2017-03-29 MED ORDER — ISOSORBIDE MONONITRATE ER 60 MG PO TB24
90.0000 mg | ORAL_TABLET | Freq: Every day | ORAL | Status: DC
  Administered 2017-03-30: 90 mg via ORAL
  Filled 2017-03-29: qty 1

## 2017-03-29 MED ORDER — ISOSORBIDE MONONITRATE ER 30 MG PO TB24
30.0000 mg | ORAL_TABLET | Freq: Once | ORAL | Status: AC
  Administered 2017-03-29: 30 mg via ORAL
  Filled 2017-03-29: qty 1

## 2017-03-29 NOTE — Progress Notes (Signed)
After 4 attempts to restart IV blood restarted. Spoke with blood band and stated to continue with blood that is hanging since already spiked, making sure blood is in within 4hrs of hanging. Cont to monitor. Carroll Kinds RN

## 2017-03-29 NOTE — Progress Notes (Signed)
Pt IV started leaking. Blood paused. Cont to monitor. Carroll Kinds RN

## 2017-03-29 NOTE — Progress Notes (Signed)
S: Occasional SOB and CP O:BP (!) 143/54 (BP Location: Left Arm)   Pulse 65   Temp 98.4 F (36.9 C) (Oral)   Resp 16   Ht 5\' 4"  (1.626 m)   Wt 75 kg (165 lb 4.8 oz)   SpO2 96%   BMI 28.37 kg/m   Intake/Output Summary (Last 24 hours) at 03/29/17 1106 Last data filed at 03/29/17 0927  Gross per 24 hour  Intake          1202.11 ml  Output             1200 ml  Net             2.11 ml   Weight change:  XBL:TJQZE and alert CVS:RRR Resp:Clear Abd:+ BS NTND Ext: Tr edema in LLE   Rt AKA NEURO: CNI Ox3 No asterixis   . acetaminophen  650 mg Oral Q8H  . aspirin  81 mg Oral Daily  . carvedilol  12.5 mg Oral BID WC  . hydrALAZINE  25 mg Oral Q8H  . insulin aspart  5 Units Subcutaneous TID AC  . insulin glargine  25 Units Subcutaneous Daily  . isosorbide mononitrate  60 mg Oral Daily  . pravastatin  40 mg Oral Daily  . ticagrelor  90 mg Oral BID   No results found. BMET    Component Value Date/Time   NA 137 03/29/2017 0305   K 4.5 03/29/2017 0305   CL 108 03/29/2017 0305   CO2 21 (L) 03/29/2017 0305   GLUCOSE 116 (H) 03/29/2017 0305   BUN 54 (H) 03/29/2017 0305   CREATININE 2.93 (H) 03/29/2017 0305   CALCIUM 8.9 03/29/2017 0305   GFRNONAA 16 (L) 03/29/2017 0305   GFRAA 19 (L) 03/29/2017 0305   CBC    Component Value Date/Time   WBC 7.7 03/29/2017 0305   RBC 2.85 (L) 03/29/2017 0305   HGB 7.2 (L) 03/29/2017 0305   HCT 23.5 (L) 03/29/2017 0305   HCT 25.0 (L) 03/27/2017 1823   PLT 262 03/29/2017 0305   MCV 82.5 03/29/2017 0305   MCH 25.3 (L) 03/29/2017 0305   MCHC 30.6 03/29/2017 0305   RDW 13.2 03/29/2017 0305   LYMPHSABS 1.7 03/24/2017 0009   MONOABS 0.6 03/24/2017 0009   EOSABS 0.0 03/24/2017 0009   BASOSABS 0.0 03/24/2017 0009     Assessment: 1. CKD 4 with mild increase in Scr most likely hemodynamically mediated.  Scr down to 2.93 from 3.33 2. NSTEMI 3. HTN 4. Anemia IV iron ordered 5. DM  Plan: 1. Would recommend transfusion  2. Discussed the  risk of heart cath and the fact that there is 40-50% chance that renal fx could worsen and require HD.  She wants to avoid HD at this time.  Discussed with Dr Ellyn Hack 3. Daily Scr   Jenna Brown

## 2017-03-29 NOTE — Progress Notes (Signed)
Pt c/o on intermit CP with bath. CP 6/10, 1 SL ntg given relief. Cont to monitor. Carroll Kinds RN

## 2017-03-29 NOTE — Progress Notes (Signed)
Progress Note  Patient Name: Jenna Brown Date of Encounter: 03/29/2017  Primary Cardiologist: Bensimhon  Subjective   Had another episode of chest pain while bathing this morning. Relieved by nitro.   Inpatient Medications    Scheduled Meds: . acetaminophen  650 mg Oral Q8H  . aspirin  81 mg Oral Daily  . carvedilol  12.5 mg Oral BID WC  . hydrALAZINE  25 mg Oral Q8H  . insulin aspart  5 Units Subcutaneous TID AC  . insulin glargine  25 Units Subcutaneous Daily  . isosorbide mononitrate  60 mg Oral Daily  . pravastatin  40 mg Oral Daily  . ticagrelor  90 mg Oral BID   Continuous Infusions:  PRN Meds: ALPRAZolam, nitroGLYCERIN, ondansetron (ZOFRAN) IV, senna   Vital Signs    Vitals:   03/28/17 0541 03/28/17 1424 03/28/17 2148 03/29/17 0508  BP: (!) 147/58 (!) 136/36 (!) 155/60 (!) 143/54  Pulse: 75 78 75 65  Resp: 16  16 16   Temp: 98.8 F (37.1 C) 98.7 F (37.1 C)  98.4 F (36.9 C)  TempSrc: Oral Oral  Oral  SpO2: 97% 95%  96%  Weight: 165 lb 4.8 oz (75 kg)     Height:        Intake/Output Summary (Last 24 hours) at 03/29/17 1116 Last data filed at 03/29/17 6073  Gross per 24 hour  Intake          1202.11 ml  Output             1200 ml  Net             2.11 ml   Filed Weights   03/26/17 0557 03/27/17 0455 03/28/17 0541  Weight: 159 lb 8 oz (72.3 kg) 156 lb (70.8 kg) 165 lb 4.8 oz (75 kg)    Telemetry    SR - Personally Reviewed  ECG    N/a - Personally Reviewed  Physical Exam   General: Well developed, well nourished, older AA female appearing in no acute distress. Head: Normocephalic, atraumatic.  Neck: Supple without bruits, JVD. Lungs:  Resp regular and unlabored, CTA. Heart: RRR, S1, S2, no S3, S4, or murmur; no rub. Abdomen: Soft, non-tender, non-distended with normoactive bowel sounds. No hepatomegaly. No rebound/guarding. No obvious abdominal masses. Extremities: No clubbing, cyanosis, edema. Distal pedal pulses are 2+ bilaterally.  Right AKA Neuro: Alert and oriented X 3. Moves all extremities spontaneously. Psych: Normal affect, though not sure that she comprehends the complexity of her situation.   Labs    Chemistry Recent Labs Lab 03/24/17 0009  03/27/17 0224 03/28/17 0250 03/29/17 0305  NA 138  < > 138 137 137  K 4.7  < > 4.1 4.5 4.5  CL 109  < > 107 105 108  CO2 22  < > 22 22 21*  GLUCOSE 207*  < > 93 167* 116*  BUN 42*  < > 51* 52* 54*  CREATININE 2.56*  < > 3.28* 3.11* 2.93*  CALCIUM 8.7*  < > 8.7* 8.5* 8.9  PROT 6.3*  --   --   --   --   ALBUMIN 3.1*  --   --   --  2.9*  AST 18  --   --   --   --   ALT 14  --   --   --   --   ALKPHOS 91  --   --   --   --   BILITOT 0.5  --   --   --   --  GFRNONAA 19*  < > 14* 15* 16*  GFRAA 22*  < > 16* 17* 19*  ANIONGAP 7  < > 9 10 8   < > = values in this interval not displayed.   Hematology Recent Labs Lab 03/25/17 0609 03/26/17 0310 03/27/17 1823 03/29/17 0305  WBC 8.4 9.1  --  7.7  RBC 3.13* 3.12*  --  2.85*  HGB 8.0* 8.0*  --  7.2*  HCT 26.1* 26.1* 25.0* 23.5*  MCV 83.4 83.7  --  82.5  MCH 25.6* 25.6*  --  25.3*  MCHC 30.7 30.7  --  30.6  RDW 13.4 13.3  --  13.2  PLT 279 272  --  262    Cardiac Enzymes Recent Labs Lab 03/24/17 0200 03/24/17 0652 03/24/17 1228 03/24/17 1744  TROPONINI 1.44* 3.86* 3.92* 2.69*   No results for input(s): TROPIPOC in the last 168 hours.   BNPNo results for input(s): BNP, PROBNP in the last 168 hours.   DDimer No results for input(s): DDIMER in the last 168 hours.    Radiology    No results found.  Cardiac Studies   Echo (03/24/17):EF 45-50%. Mild HK of distal inferior-inferoseptal and apical walls. GR 1 DD. Calcified aortic valve - with nodular echo bright segment also previously noted on TTE but not TEE.  Patient Profile     66 y.o. female with HLD, prior CVA, HTN, DM, severe multi-vessel CAD, ischemic cardiomyopathy, stage 4 CKD, PAD s/p right AKA admitted with chest pain. She has undergone  cardiac cath in July 2016 and was found to have severe triple vessel CAD. She was not felt to be a candidate for CABG at that time. PCI was discussed but given cardiogenic shock at that time, her medical therapy was optimized and cardiac cath was not repeated.   Most recently admitted 4/8 ->01/03/2017 with hypertensive emergency and AKI. BP up to 225/98 Troponin max to 4.62 felt to be demand ischemia. Started on Nitro + heparin gtt. Lasix and losartan held in AKI. Renal consulted. Medications heavily adjusted.   Assessment & Plan    1. NSTEMI with hx of CAD: Peak troponin 3.92. Currently attempting to treat medically, though continues to have intermittent episodes of chest pain, brief in nature and relieved by SL nitro. IV heparin stopped yesterday. Had another episode of chest pain this morning while bathing. Again would favor to continue to treat medically at this time.  -- ASA, statin, BB, hydralazine, and imdur  2. AKI on CKD stage IV: Cr improved to 2.93 today. Nephrology consulted for assistance with management.  -- ARB held at this time.   3. ICM: EF at 45-50% by echo this admission.  -- continue BB, ARB held in the setting of AKI  4. HTN: remains borderline controlled.  -- increased hydralazine to 50mg  TID, Imdur 90mg  daily.   5. HL: on statin  6. Anemia: Hgb down from 8>>7.2 today. Given Iron by nephrology yesterday evening.  -- will transfuse 1 unit now, follow up H/H.  -- resume home oral lasix 40mg  daily given need for extra volume with transfusion.   7. DM: Cbgs controlled.  -- continue SSI   Signed, Reino Bellis, NP  03/29/2017, 11:16 AM    I have seen, examined and evaluated the patient this AM along with Reino Bellis, NP-C.  After reviewing all the available data and chart, we discussed the patients laboratory, study & physical findings as well as symptoms in detail. I agree with her findings, examination  as well as impression recommendations as per our  discussion.    She did have at least one episode of chest pain off of IV heparin. Unfortunately her hemoglobin is also dropped a day.  Not unexpectedly, the patient somewhat misunderstood my discussion with her yesterday. I have not insinuated that we were planning on a cardiac catheterization today. I simply insinuated that I was going to relook at her films to see if there are any potential options. I did review the films from several years ago and her vessels are very small caliber. She actually has an occluded LAD after diagonal branch that is not shown on the images on the cath report. There is severe upstream LAD disease prior to a major diagonal branch. The circumflex has severe Disease with severe diffusely diseased small caliber OM branches. There are potential PCI options, however as noted when the nephrology consultation that she would be at high risk for going on dialysis with with her renal insufficiency. Based on previous discussion, she would not want to go on dialysis.  At this point, I think the best course of action is to continue to treat her with antianginals. We will fuse her 2 units over the next 24 hours to see if hopefully her chest pain will resolve with resolution of anemia.  She will likely require iron on discharge, but I think the quickest way to get her iron is transfusion.  As for the disposition plans, we are increasing her hydralazine and Imdur dose for better blood pressure and antianginal control. Unfortunately with her renal insufficiency Ranexa as not a good option. If blood pressure will tolerate after increasing hydralazine and in there, we will consider adding amlodipine for additional antianginal effect.  She is on aspirin and Brilinta already. She is also on statin.   Goals of care for her will be to optimize her medical management over the course of the next 24+ hours with transfusion and then consider discharge home. We need to set up a contingency plan for  if her and they cannot be controlled with medical management. I think is probably best discussed in the outpatient setting with Dr. Haroldine Laws. Percutaneous revascularization would likely require multiple staged procedures where we do a diagnostic procedure 1 day then bring her back for one or 2 vessel PCI. However her vessels are not great PCI targets based on their small size and the extent of disease. She will require long small diameter stents.     Glenetta Hew, M.D., M.S. Interventional Cardiologist   Pager # [phone] Phone # [phone] [address] [place], [place] [address]

## 2017-03-30 ENCOUNTER — Encounter (HOSPITAL_COMMUNITY): Payer: Self-pay | Admitting: General Practice

## 2017-03-30 LAB — CBC
HCT: 28.7 % — ABNORMAL LOW (ref 36.0–46.0)
Hemoglobin: 9 g/dL — ABNORMAL LOW (ref 12.0–15.0)
MCH: 26.5 pg (ref 26.0–34.0)
MCHC: 31.4 g/dL (ref 30.0–36.0)
MCV: 84.7 fL (ref 78.0–100.0)
PLATELETS: 273 10*3/uL (ref 150–400)
RBC: 3.39 MIL/uL — ABNORMAL LOW (ref 3.87–5.11)
RDW: 13.8 % (ref 11.5–15.5)
WBC: 9 10*3/uL (ref 4.0–10.5)

## 2017-03-30 LAB — TYPE AND SCREEN
ABO/RH(D): A POS
Antibody Screen: NEGATIVE
UNIT DIVISION: 0

## 2017-03-30 LAB — RENAL FUNCTION PANEL
Albumin: 3.1 g/dL — ABNORMAL LOW (ref 3.5–5.0)
Anion gap: 6 (ref 5–15)
BUN: 50 mg/dL — ABNORMAL HIGH (ref 6–20)
CHLORIDE: 109 mmol/L (ref 101–111)
CO2: 22 mmol/L (ref 22–32)
CREATININE: 2.85 mg/dL — AB (ref 0.44–1.00)
Calcium: 8.8 mg/dL — ABNORMAL LOW (ref 8.9–10.3)
GFR calc Af Amer: 19 mL/min — ABNORMAL LOW (ref >60)
GFR calc non Af Amer: 17 mL/min — ABNORMAL LOW (ref >60)
GLUCOSE: 136 mg/dL — AB (ref 65–99)
Phosphorus: 4.2 mg/dL (ref 2.5–4.6)
Potassium: 4.6 mmol/L (ref 3.5–5.1)
SODIUM: 137 mmol/L (ref 135–145)

## 2017-03-30 LAB — BPAM RBC
Blood Product Expiration Date: 201807172359
ISSUE DATE / TIME: 201807111645
Unit Type and Rh: 6200

## 2017-03-30 LAB — GLUCOSE, CAPILLARY
Glucose-Capillary: 137 mg/dL — ABNORMAL HIGH (ref 65–99)
Glucose-Capillary: 147 mg/dL — ABNORMAL HIGH (ref 65–99)

## 2017-03-30 MED ORDER — HYDRALAZINE HCL 25 MG PO TABS: 50.0000 mg | ORAL_TABLET | Freq: Three times a day (TID) | ORAL | 2 refills | Status: DC

## 2017-03-30 MED ORDER — ISOSORBIDE MONONITRATE ER 30 MG PO TB24: 90.0000 mg | ORAL_TABLET | Freq: Every day | ORAL | 6 refills | Status: DC

## 2017-03-30 MED ORDER — LOSARTAN POTASSIUM 50 MG PO TABS: 50.0000 mg | ORAL_TABLET | Freq: Every day | ORAL | 2 refills | Status: DC

## 2017-03-30 NOTE — Progress Notes (Signed)
Pt got herself into wc. Rolled self up and down hallway. Pt states "nervous colon", but no CP. Cont to monitor. Carroll Kinds RN

## 2017-03-30 NOTE — Discharge Summary (Signed)
Discharge Summary    Patient ID: YU CRAGUN,  MRN: [id number], DOB/AGE: 1951/09/13 66 y.o.  Admit date: 03/23/2017 Discharge date: 03/30/2017  Primary Care Provider: Benito Brown Primary Cardiologist: Jenna Brown  Discharge Diagnoses    Active Problems:   NSTEMI (non-ST elevated myocardial infarction) ([place])   CAD in native artery   Peripheral vascular disease ([place])   Anemia   Essential hypertension   HLD (hyperlipidemia)   Diabetes mellitus, insulin dependent (IDDM), uncontrolled ([place])   Cardiomyopathy, ischemic   Chronic systolic heart failure (HCC)   Chronic kidney disease (CKD), stage IV (severe) (HCC)   Allergies Allergies  Allergen Reactions  . Plavix [Clopidogrel Bisulfate] Itching  . Codeine Other (See Comments)    "makes me feel strange"  . Tylenol With Codeine #3 [Acetaminophen-Codeine] Other (See Comments)    "doesn't make me feel right"  . Tylenol [Acetaminophen] Other (See Comments)    "Doesn't feel right"     Diagnostic Studies/Procedures    TTE: 03/24/17  Study Conclusions  - Left ventricle: LVEF is approximately 45 to 50% with mild   hypokinesis of the distal inferior/inferoseptal and apical walls.   The cavity size was normal. Wall thickness was increased in a   pattern of mild LVH. Doppler parameters are consistent with   abnormal left ventricular relaxation (grade 1 diastolic   dysfunction). - Aortic valve: AV is thickened, calcified In the parasternal view   there is an nodular echo bright segment (18 x 7 mm) May represent   nodular thickening of valve Not convincing for discrete mass. It   appears to be present on TTE in April 2018 TEE after that showed   no abnormalitiy to AV. - Mitral valve: Calcified annulus. Mildly thickened leaflets.  _____________   History of Present Illness     Jenna Brown is a 66 yo female with a pmhx of multivessel-CAD deemed to be a poor candidate for surgical or percutaneous  revascularization,systolic HF with improved LVEF(LVEF 20% in 2016, now 45-50%),IDDM, HTN, CKD, dyslipidemia, prior CVA, and s/p right AKA who presented to [facility] on 03/23/17 for evaluation of chest pain.  Pts sister was at bedside assisted with history.  Pt was in her usual state of health up until Wednesday afternoon, when her family noted that her appetite was decreased and she was complaining of some worsening chest pain.  Pt stated that she frequently gets chest pain which resolves with SL NTG, thus she did not think much else of it until this evening, when she developed worsening pain at around 1900 that evening.  Pt stated that she was eating dinner when she experienced 8/10, midsternal discomfort described as a pressure, which radiated into her left arm and was similar to her prior AMIs.  Denied associated symptoms including SOB, palpitations, fevers, chills, nausea, diaphoresis, or dizziness.  She took SL NTG x 2-3 (she unsure of which), however when this did not improve the pain, she called EMS.  En route, she received another SL NTG which significantly improved her pain.  In the ED, nitropaste was administered which resulted in complete resolution of her pain.  Initial troponin was positive and EKG showed no acute ischemic changes compared to prior.  WBC was mildly elevated but she had not evidence of infectious process.  Cardiology was asked to admit for an NSTEMI.   Pt was admitted to [facility] in April of this year for similar symptoms in the setting of hypertensive emergency.  Trop peaked at around 4  at that time and it was felt that the NSTEMI may represent a type II MI due to elevated BP.  There was also some question regarding medication compliance at that time.  She stated that since then, she thinks she has been taking her medications as prescribed, though she admited to only taking Brilinta once daily because she was worried about her blood getting "too thin."  She also recently started iron  supplementation as per her nephrologist for chronic iron deficiency anemia.    Hospital Course     Consultants: Nephrology   She was admitted and started on IV heparin. Troponin peaked at 3.92. Follow up echo this admission showed stable LVEF at 45-50%. Given her CKD plan was to observe and treat medically with increasing anginal therapy. Home Imdur was titrated from 60-90mg  daily. ARB was held on admission 2/2 to her CKD. Scr did continue to rise this admission and peaked at 3.33. Her lasix was held briefly. Blood pressure was uncontrolled and therefore hydralazine was increased to 50mg  TID with improvement. Nephrology was consulted regarding worsening Cr levels, and concern for contrast nephropathy if potential cath. Hgb noted to drop to 8.0. Iron studies low, and treated with IV iron per nephrology. Hgb further dropped to 7.2 and received 1 unit PRBCs with improvement to 9.0. She experienced fewer episodes of chest pain as her anginal therapy was adjusted. Her cath films were reviewed again, but overall felt that no good PCI targets were noted. Lasix was resumed after receiving PRBCs. Cr improved and losartan was restarted at reduced dose of 50mg  daily. She was able to work with the nursing staff without further episodes of chest pain.   She was seen by Dr. Ellyn Brown and determined stable for discharge home. Follow up in the HF clinic has been arranged. Medications are listed below.  _____________  Discharge Vitals Blood pressure (!) 141/73, pulse 89, temperature 98.5 F (36.9 C), temperature source Oral, resp. rate 16, height 5\' 4"  (1.626 m), weight 165 lb 3.2 oz (74.9 kg), SpO2 96 %.  Filed Weights   03/27/17 0455 03/28/17 0541 03/30/17 0538  Weight: 156 lb (70.8 kg) 165 lb 4.8 oz (75 kg) 165 lb 3.2 oz (74.9 kg)    Labs & Radiologic Studies    CBC  Recent Labs  03/29/17 2333 03/30/17 0605  WBC 8.6 9.0  HGB 8.6* 9.0*  HCT 27.1* 28.7*  MCV 84.4 84.7  PLT 260 564   Basic Metabolic  Panel  Recent Labs  03/29/17 0305 03/30/17 0605  NA 137 137  K 4.5 4.6  CL 108 109  CO2 21* 22  GLUCOSE 116* 136*  BUN 54* 50*  CREATININE 2.93* 2.85*  CALCIUM 8.9 8.8*  PHOS 4.5 4.2   Liver Function Tests  Recent Labs  03/29/17 0305 03/30/17 0605  ALBUMIN 2.9* 3.1*   No results for input(s): LIPASE, AMYLASE in the last 72 hours. Cardiac Enzymes No results for input(s): CKTOTAL, CKMB, CKMBINDEX, TROPONINI in the last 72 hours. BNP Invalid input(s): POCBNP D-Dimer No results for input(s): DDIMER in the last 72 hours. Hemoglobin A1C No results for input(s): HGBA1C in the last 72 hours. Fasting Lipid Panel No results for input(s): CHOL, HDL, LDLCALC, TRIG, CHOLHDL, LDLDIRECT in the last 72 hours. Thyroid Function Tests No results for input(s): TSH, T4TOTAL, T3FREE, THYROIDAB in the last 72 hours.  Invalid input(s): FREET3 _____________  Dg Chest Port 1 View  Result Date: 03/24/2017 CLINICAL DATA:  Acute onset of generalized chest pain. Initial encounter.  EXAM: PORTABLE CHEST 1 VIEW COMPARISON:  Chest radiograph performed 12/25/2016 FINDINGS: The lungs are hypoexpanded but appear grossly clear. No pleural effusion or pneumothorax is seen. The cardiomediastinal silhouette is borderline normal in size. No acute osseous abnormalities are identified. IMPRESSION: Lungs hypoexpanded but grossly clear. Electronically Signed   By: Garald Balding M.D.   On: 03/24/2017 00:13   Disposition   Pt is being discharged home today in good condition.  Follow-up Plans & Appointments    Follow-up Information    Conrad , NP Follow up on 04/19/2017.   Specialty:  Cardiology Why:  at 10am for your follow up appt.  Contact information: [address] [place] [address] [phone]        Dr. Joesph July Follow up.   Why:  Please arrange follow up with your kidney MD within the next 2 weeks.          Discharge Instructions    (HEART FAILURE PATIENTS) Call MD:  Anytime you  have any of the following symptoms: 1) 3 pound weight gain in 24 hours or 5 pounds in 1 week 2) shortness of breath, with or without a dry hacking cough 3) swelling in the hands, feet or stomach 4) if you have to sleep on extra pillows at night in order to breathe.    Complete by:  As directed    Diet - low sodium heart healthy    Complete by:  As directed    Increase activity slowly    Complete by:  As directed      Discharge Medications   Current Discharge Medication List    CONTINUE these medications which have CHANGED   Details  hydrALAZINE (APRESOLINE) 25 MG tablet Take 2 tablets (50 mg total) by mouth every 8 (eight) hours. Qty: 90 tablet, Refills: 2    isosorbide mononitrate (IMDUR) 30 MG 24 hr tablet Take 3 tablets (90 mg total) by mouth daily. Qty: 30 tablet, Refills: 6    losartan (COZAAR) 50 MG tablet Take 1 tablet (50 mg total) by mouth daily. Qty: 30 tablet, Refills: 2      CONTINUE these medications which have NOT CHANGED   Details  acetaminophen (TYLENOL) 650 MG CR tablet Take 650 mg by mouth every 8 (eight) hours as needed for pain.     ALPRAZolam (XANAX) 0.25 MG tablet Take 1 tablet (0.25 mg total) by mouth 3 (three) times daily as needed for anxiety. Qty: 12 tablet, Refills: 0    aspirin 81 MG chewable tablet Chew 81 mg by mouth daily.    carvedilol (COREG) 12.5 MG tablet Take 1 tablet (12.5 mg total) by mouth 2 (two) times daily with a meal. Qty: 60 tablet, Refills: 2    furosemide (LASIX) 40 MG tablet Take 1 tablet (40 mg total) by mouth daily. Qty: 90 tablet, Refills: 3    insulin aspart (NOVOLOG) 100 UNIT/ML injection Inject 5 Units into the skin 3 (three) times daily before meals. Qty: 10 mL, Refills: 11    insulin glargine (LANTUS) 100 UNIT/ML injection Inject 0.25 mLs (25 Units total) into the skin 2 (two) times daily. Qty: 10 mL, Refills: 11    Insulin Pen Needle (AURORA PEN NEEDLES) 29G X 12MM MISC 5 Units by Does not apply route 3 (three) times  daily before meals. Qty: 100 each, Refills: 0    nitroGLYCERIN (NITROSTAT) 0.4 MG SL tablet Place 1 tablet (0.4 mg total) under the tongue every 5 (five) minutes x 3 doses as  needed for chest pain. Qty: 25 tablet, Refills: 3    pravastatin (PRAVACHOL) 40 MG tablet Take 40 mg by mouth daily.    ticagrelor (BRILINTA) 90 MG TABS tablet Take 1 tablet (90 mg total) by mouth 2 (two) times daily. Qty: 60 tablet, Refills: 11          Outstanding Labs/Studies   BMET at follow up visit.   Duration of Discharge Encounter   Greater than 30 minutes including physician time.  Signed, Reino Bellis NP-C 03/30/2017, 1:26 PM

## 2017-03-30 NOTE — Plan of Care (Signed)
Problem: Activity: Goal: Ability to tolerate increased activity will improve Outcome: Completed/Met Date Met: 03/30/17 Pt able to get in wc and roll around without any cp   

## 2017-03-30 NOTE — Progress Notes (Signed)
S: Episode of CP relieved with 1 NTG O:BP (!) 141/73 (BP Location: Left Arm)   Pulse 89   Temp 98.5 F (36.9 C) (Oral)   Resp 16   Ht 5\' 4"  (1.626 m)   Wt 74.9 kg (165 lb 3.2 oz)   SpO2 96%   BMI 28.36 kg/m   Intake/Output Summary (Last 24 hours) at 03/30/17 0648 Last data filed at 03/30/17 0539  Gross per 24 hour  Intake              244 ml  Output             2500 ml  Net            -2256 ml   Weight change:  ASN:KNLZJ and alert CVS:RRR Resp:Clear Abd:+ BS NTND Ext: 0-Tr edema in LLE   Rt AKA NEURO: CNI Ox3 No asterixis   . acetaminophen  650 mg Oral Q8H  . aspirin  81 mg Oral Daily  . carvedilol  12.5 mg Oral BID WC  . furosemide  40 mg Oral Daily  . hydrALAZINE  50 mg Oral Q8H  . insulin aspart  5 Units Subcutaneous TID AC  . insulin glargine  25 Units Subcutaneous Daily  . isosorbide mononitrate  90 mg Oral Daily  . pravastatin  40 mg Oral Daily  . ticagrelor  90 mg Oral BID   No results found. BMET    Component Value Date/Time   NA 137 03/29/2017 0305   K 4.5 03/29/2017 0305   CL 108 03/29/2017 0305   CO2 21 (L) 03/29/2017 0305   GLUCOSE 116 (H) 03/29/2017 0305   BUN 54 (H) 03/29/2017 0305   CREATININE 2.93 (H) 03/29/2017 0305   CALCIUM 8.9 03/29/2017 0305   GFRNONAA 16 (L) 03/29/2017 0305   GFRAA 19 (L) 03/29/2017 0305   CBC    Component Value Date/Time   WBC 8.6 03/29/2017 2333   RBC 3.21 (L) 03/29/2017 2333   HGB 8.6 (L) 03/29/2017 2333   HCT 27.1 (L) 03/29/2017 2333   HCT 25.0 (L) 03/27/2017 1823   PLT 260 03/29/2017 2333   MCV 84.4 03/29/2017 2333   MCH 26.8 03/29/2017 2333   MCHC 31.7 03/29/2017 2333   RDW 13.7 03/29/2017 2333   LYMPHSABS 1.7 03/24/2017 0009   MONOABS 0.6 03/24/2017 0009   EOSABS 0.0 03/24/2017 0009   BASOSABS 0.0 03/24/2017 0009     Assessment: 1. CKD 4 with mild increase in Scr most likely hemodynamically mediated.  UO good, labs P 2. NSTEMI 3. HTN 4. Anemia SP IV iron and transfusion 5. DM  Plan: 1. I  would get her up in wheelchair and have her move around the halls to see if will be pain free 2. Await labs as they are still pending   Meilin Brosh T

## 2017-04-01 ENCOUNTER — Telehealth: Payer: Self-pay | Admitting: Cardiology

## 2017-04-01 NOTE — Telephone Encounter (Signed)
Patient's sister called stating that patient was discharged home from the hospital on Thursday on Imdur 90mg  daily.  Patient states that she gets some left sided pain in her lower abdomen.  She does not want to take all 90mg  at a time.  I told her it would be ok to try to take 30mg  at breakfast, 30mg  at lunch and 30mg  at dinner to total 90mg  daily to see if she had any problems with that.  I instructed her to let Dr. Ellyn Hack know if she still had problems with it.

## 2017-04-02 NOTE — Telephone Encounter (Signed)
Is fine to split her Imdur dose 30/60.  I saw her while in the hospital, but I think I will defer further evaluation and management issues to her primary cardiologist: Dr. Aundra Dubin.  Glenetta Hew, MD

## 2017-04-03 MED ORDER — ISOSORBIDE MONONITRATE ER 30 MG PO TB24: ORAL_TABLET | ORAL | 6 refills | Status: DC

## 2017-04-03 NOTE — Addendum Note (Signed)
Addended by: Scarlette Calico on: 04/03/2017 04:54 PM   Modules accepted: Orders

## 2017-04-03 NOTE — Telephone Encounter (Signed)
Agree with Dr. Ellyn Hack.

## 2017-04-03 NOTE — Telephone Encounter (Signed)
Spoke w/pt's sister, she is aware and agreeable, she states that is what they did and it seems to help

## 2017-04-19 ENCOUNTER — Encounter (HOSPITAL_COMMUNITY): Payer: Self-pay

## 2017-04-19 ENCOUNTER — Ambulatory Visit (HOSPITAL_COMMUNITY)
Admission: RE | Admit: 2017-04-19 | Discharge: 2017-04-19 | Disposition: A | Discharge status: Home / Self Care | Payer: Medicare Other | Source: Ambulatory Visit | Attending: Cardiology | Admitting: Cardiology

## 2017-04-19 VITALS — BP 162/80 | HR 66

## 2017-04-19 DIAGNOSIS — Z794 Long term (current) use of insulin: Secondary | ICD-10-CM | POA: Diagnosis not present

## 2017-04-19 DIAGNOSIS — I252 Old myocardial infarction: Secondary | ICD-10-CM | POA: Insufficient documentation

## 2017-04-19 DIAGNOSIS — Z79899 Other long term (current) drug therapy: Secondary | ICD-10-CM | POA: Insufficient documentation

## 2017-04-19 DIAGNOSIS — I5022 Chronic systolic (congestive) heart failure: Secondary | ICD-10-CM | POA: Insufficient documentation

## 2017-04-19 DIAGNOSIS — Z89611 Acquired absence of right leg above knee: Secondary | ICD-10-CM | POA: Insufficient documentation

## 2017-04-19 DIAGNOSIS — Z803 Family history of malignant neoplasm of breast: Secondary | ICD-10-CM | POA: Diagnosis not present

## 2017-04-19 DIAGNOSIS — I10 Essential (primary) hypertension: Secondary | ICD-10-CM | POA: Diagnosis not present

## 2017-04-19 DIAGNOSIS — M1990 Unspecified osteoarthritis, unspecified site: Secondary | ICD-10-CM | POA: Diagnosis not present

## 2017-04-19 DIAGNOSIS — I130 Hypertensive heart and chronic kidney disease with heart failure and stage 1 through stage 4 chronic kidney disease, or unspecified chronic kidney disease: Secondary | ICD-10-CM | POA: Diagnosis not present

## 2017-04-19 DIAGNOSIS — I739 Peripheral vascular disease, unspecified: Secondary | ICD-10-CM | POA: Diagnosis not present

## 2017-04-19 DIAGNOSIS — G459 Transient cerebral ischemic attack, unspecified: Secondary | ICD-10-CM

## 2017-04-19 DIAGNOSIS — Z7982 Long term (current) use of aspirin: Secondary | ICD-10-CM | POA: Diagnosis not present

## 2017-04-19 DIAGNOSIS — Z8673 Personal history of transient ischemic attack (TIA), and cerebral infarction without residual deficits: Secondary | ICD-10-CM | POA: Insufficient documentation

## 2017-04-19 DIAGNOSIS — Z87891 Personal history of nicotine dependence: Secondary | ICD-10-CM | POA: Diagnosis not present

## 2017-04-19 DIAGNOSIS — Z833 Family history of diabetes mellitus: Secondary | ICD-10-CM | POA: Diagnosis not present

## 2017-04-19 DIAGNOSIS — E1122 Type 2 diabetes mellitus with diabetic chronic kidney disease: Secondary | ICD-10-CM | POA: Diagnosis not present

## 2017-04-19 DIAGNOSIS — I2583 Coronary atherosclerosis due to lipid rich plaque: Secondary | ICD-10-CM | POA: Diagnosis not present

## 2017-04-19 DIAGNOSIS — Z7902 Long term (current) use of antithrombotics/antiplatelets: Secondary | ICD-10-CM | POA: Diagnosis not present

## 2017-04-19 DIAGNOSIS — I2510 Atherosclerotic heart disease of native coronary artery without angina pectoris: Secondary | ICD-10-CM | POA: Diagnosis not present

## 2017-04-19 DIAGNOSIS — E1151 Type 2 diabetes mellitus with diabetic peripheral angiopathy without gangrene: Secondary | ICD-10-CM | POA: Insufficient documentation

## 2017-04-19 DIAGNOSIS — E1165 Type 2 diabetes mellitus with hyperglycemia: Secondary | ICD-10-CM | POA: Diagnosis not present

## 2017-04-19 DIAGNOSIS — F419 Anxiety disorder, unspecified: Secondary | ICD-10-CM | POA: Diagnosis not present

## 2017-04-19 DIAGNOSIS — E785 Hyperlipidemia, unspecified: Secondary | ICD-10-CM | POA: Diagnosis not present

## 2017-04-19 DIAGNOSIS — K219 Gastro-esophageal reflux disease without esophagitis: Secondary | ICD-10-CM | POA: Diagnosis not present

## 2017-04-19 DIAGNOSIS — IMO0002 Reserved for concepts with insufficient information to code with codable children: Secondary | ICD-10-CM

## 2017-04-19 DIAGNOSIS — N184 Chronic kidney disease, stage 4 (severe): Secondary | ICD-10-CM | POA: Insufficient documentation

## 2017-04-19 LAB — BASIC METABOLIC PANEL
ANION GAP: 8 (ref 5–15)
BUN: 46 mg/dL — ABNORMAL HIGH (ref 6–20)
CO2: 21 mmol/L — AB (ref 22–32)
Calcium: 9.1 mg/dL (ref 8.9–10.3)
Chloride: 114 mmol/L — ABNORMAL HIGH (ref 101–111)
Creatinine, Ser: 2.75 mg/dL — ABNORMAL HIGH (ref 0.44–1.00)
GFR calc non Af Amer: 17 mL/min — ABNORMAL LOW (ref >60)
GFR, EST AFRICAN AMERICAN: 20 mL/min — AB (ref >60)
GLUCOSE: 112 mg/dL — AB (ref 65–99)
POTASSIUM: 5.5 mmol/L — AB (ref 3.5–5.1)
Sodium: 143 mmol/L (ref 135–145)

## 2017-04-19 LAB — CBC
HEMATOCRIT: 31.5 % — AB (ref 36.0–46.0)
HEMOGLOBIN: 9.9 g/dL — AB (ref 12.0–15.0)
MCH: 26.6 pg (ref 26.0–34.0)
MCHC: 31.4 g/dL (ref 30.0–36.0)
MCV: 84.7 fL (ref 78.0–100.0)
Platelets: 269 10*3/uL (ref 150–400)
RBC: 3.72 MIL/uL — AB (ref 3.87–5.11)
RDW: 13.9 % (ref 11.5–15.5)
WBC: 7.9 10*3/uL (ref 4.0–10.5)

## 2017-04-19 NOTE — Patient Instructions (Signed)
Labs today (will call for abnormal results, otherwise no news is good news)  Follow up in 2 Months 

## 2017-04-19 NOTE — Progress Notes (Signed)
Advanced Heart Failure Clinic Note   Patient ID: Jenna Brown, female   DOB: [birth year], 66 y.o.   MRN: [id number]   PCP: Dr. Vista Lawman Primary HF Cardiologist: Dr Haroldine Laws Renal:  Hulen Shouts [phone])   HPI:  Jenna Brown is a 66 y.o. female with hyperlipidemia, CVA, hypertension, type 2 diabetes mellitus, severe CAD, right AKA .   Admitted to [facility] 04/10/15 with increased dyspnea. Had RHC/LHC as noted below. Not candidate for CABG per Dr Prescott Gum due to comorbidities and poor targets. Required short term milrinone for cardiogenic shock. As she improved and fully diuresed, milrinone stopped. She had recommendations for lasix, spiro, losartan, hydralazine, and imdur.  Discharge weight was 158 pounds.     Admitted 4/8 -> 01/03/2017 with hypertensive emergency and AKI. BP up to 225/98 Troponin max to 4.62 felt to be demand ischemia. Started on Nitro + heparin gtt. Lasix and losartan held in AKI.  Renal consulted. Medications heavily adjusted.   Admitted 03/23/17 - 03/30/17 with NSTEMI. She had not been taking her Brilinta correctly. Decision was made to treat her medically as it was felt that she was at high risk of CIN with her CKD. Her isosorbide was increased to 90 mg daily. Her chest pain improved with increased isosorbide, creatinine was as high as 3.33. Her losartan was restarted at discharge after AKI resolved.   She presents today for post hospital follow up. Overall she is feeling well, sometimes she has intermittent SOB that occurs at both rest and with activity. No recurrent chest pain. She says that she is concerned that the Kary Kos is going to make her "blood too thin and that is bad for you". She says she is taking her Brilinta BID, but when I really press her on the issue, she says that most days she doesn't take it. Furthermore, she has not taken any medications. Eating some high salt foods, drinking less than 2L a day.   Echo 12/27/16 LVEF 45-50%, diffuse hypokinesis, Grade 1  DD and ? Mass on left atrial wall. TEE 01/03/17 45%, trivial MR and TR, no LA/LAA thrombus or mass, negative bubble study.     RHC/LHC 04/14/15  Prox LAD to Mid LAD lesion, 90% stenosed. Diffuse disease  Ost Cx to Mid Cx lesion, 95% stenosed.  Ost 2nd Mrg to 2nd Mrg lesion, 80% stenosed.  Mid Cx lesion, 90% stenosed.  Prox RCA lesion, 60% stenosed.  Mid RCA lesion, 50% stenosed.   Labs 04/22/2015 K 4.5 Creatinine 1.24  Labs 05/14/2015; K 4.3  Labs 06/10/15; K 4.1, Creatinine 1.34  ROS: All systems negative except as listed in HPI, PMH and Problem List.  SH:  Social History   Social History  . Marital status: Single    Spouse name: N/A  . Number of children: N/A  . Years of education: N/A   Occupational History  . Not on file.   Social History Main Topics  . Smoking status: Former Smoker    Packs/day: 0.25    Years: 40.00    Types: Cigarettes    Quit date: 02/27/2012  . Smokeless tobacco: Never Used  . Alcohol use No  . Drug use: No  . Sexual activity: No   Other Topics Concern  . Not on file   Social History Narrative  . No narrative on file   FH:  Family History  Problem Relation Age of Onset  . Cancer Mother        breast  . Diabetes Father  Past Medical History:  Diagnosis Date  . Anemia   . Anxiety    takes Xanax prn  . Arthritis   . CKD (chronic kidney disease), stage IV ([place])   . Coronary artery disease   . GERD (gastroesophageal reflux disease)   . H/O hiatal hernia   . Headache(784.0)    when b/p is elevated  . Hemorrhoids   . Hyperlipidemia    takes Simvastatin daily  . Hypertension    takes Amlodipine/HCTZ/Losartan daily  . Migraines   . Myocardial infarction ([place]) 1990's  . Peripheral vascular disease ([place])   . PONV (postoperative nausea and vomiting)   . Stroke [facility])    TIA history  . Type II diabetes mellitus (HCC)    takes Glucovance and Januvia daily  . Vertigo    takes Ativert prn    Current Outpatient  Prescriptions  Medication Sig Dispense Refill  . acetaminophen (TYLENOL) 650 MG CR tablet Take 650 mg by mouth every 8 (eight) hours as needed for pain.     Marland Kitchen ALPRAZolam (XANAX) 0.25 MG tablet Take 1 tablet (0.25 mg total) by mouth 3 (three) times daily as needed for anxiety. 12 tablet 0  . aspirin 81 MG chewable tablet Chew 81 mg by mouth daily.    . carvedilol (COREG) 12.5 MG tablet Take 1 tablet (12.5 mg total) by mouth 2 (two) times daily with a meal. 60 tablet 2  . cholecalciferol (VITAMIN D) 1000 units tablet Take 1,000 Units by mouth daily.    . furosemide (LASIX) 40 MG tablet Take 1 tablet (40 mg total) by mouth daily. 90 tablet 3  . hydrALAZINE (APRESOLINE) 25 MG tablet Take 50 mg by mouth 2 (two) times daily.    . insulin aspart (NOVOLOG) 100 UNIT/ML injection Inject 5 Units into the skin 3 (three) times daily before meals. 10 mL 11  . insulin glargine (LANTUS) 100 UNIT/ML injection Inject 0.25 mLs (25 Units total) into the skin 2 (two) times daily. 10 mL 11  . Insulin Pen Needle (AURORA PEN NEEDLES) 29G X 12MM MISC 5 Units by Does not apply route 3 (three) times daily before meals. 100 each 0  . isosorbide mononitrate (IMDUR) 30 MG 24 hr tablet Take 2 tabs in AM and 1 tab in PM 30 tablet 6  . losartan (COZAAR) 50 MG tablet Take 1 tablet (50 mg total) by mouth daily. 30 tablet 2  . pravastatin (PRAVACHOL) 40 MG tablet Take 40 mg by mouth daily.    . ticagrelor (BRILINTA) 90 MG TABS tablet Take 1 tablet (90 mg total) by mouth 2 (two) times daily. 60 tablet 11  . nitroGLYCERIN (NITROSTAT) 0.4 MG SL tablet Place 1 tablet (0.4 mg total) under the tongue every 5 (five) minutes x 3 doses as needed for chest pain. (Patient not taking: Reported on 04/19/2017) 25 tablet 3   No current facility-administered medications for this encounter.     Vitals:   04/19/17 1007  BP: (!) 162/80  Pulse: 66  SpO2: 100%    PHYSICAL EXAM:  General: Elderly female, NAD. Arrived in wheelchair.   HEENT:  Normal Neck: supple. JVD not elevated. . Carotids 2+ bilat; no bruits. No thyromegaly or nodule noted. Cor: PMI nondisplaced. Regular rate and rhythm. Lungs: Clear bilaterally. Normal effort.  Abdomen: Soft, non tender, non distended. no HSM. No bruits or masses. +BS  Extremities: no cyanosis, clubbing, or rash. No edema LLE. R BKA.  Neuro: alert & orientedx3, cranial nerves grossly intact. moves  all 4 extremities w/o difficulty. Affect pleasant   ASSESSMENT & PLAN:  1. Chronic systolic CHF: ICM.  - Echo 03/24/17 EF 45-50%.  - Volume status stable stable, on exam. Continue lasix 40 mg daily.  - Continue Coreg 12.5 mg BID - Continue hydralazine 25 mg TID  - Continue Imdur 90 mg daily. (2 tabs in the am and one tab in the pm).  - Continue losartan 50 mg daily.  - Encouraged medication compliance.   2.CAD - last heart cath 2016 with prox to mid 90% LAD lesion, ostial LCx 95% stenosed. Prox RCA 60%  - Emphasized the importance of DAPT with Brilinta and ASA. Explained that anti-platelet therapy will not make her blood "too thin". She agrees to take Brilinta BID.   3. PAD s/p R AKA - Stable.   4. DMII  - Follows with PCP   5. TIA  - No recent symptoms - continue statin and ASA.   6. CKD Stage IV - Sees. Dr. Joesph July in [facility].  - BMET today.   7. HTN _ Hypertensive today but has not taken her medications.   Follow up in 2 months with Dr. Haroldine Laws   Arbutus Leas, NP  10:29 AM

## 2017-04-20 ENCOUNTER — Telehealth (HOSPITAL_COMMUNITY): Payer: Self-pay | Admitting: Cardiology

## 2017-04-20 DIAGNOSIS — I509 Heart failure, unspecified: Secondary | ICD-10-CM

## 2017-04-20 NOTE — Telephone Encounter (Signed)
-----   Message from Arbutus Leas, NP sent at 04/19/2017  1:14 PM EDT ----- Please repeat BMET next week to check K. Ask her to please avoid foods with high potassium. Thank you.

## 2017-04-24 DIAGNOSIS — R1013 Epigastric pain: Secondary | ICD-10-CM | POA: Diagnosis not present

## 2017-04-24 DIAGNOSIS — Z89611 Acquired absence of right leg above knee: Secondary | ICD-10-CM | POA: Diagnosis not present

## 2017-04-24 DIAGNOSIS — I119 Hypertensive heart disease without heart failure: Secondary | ICD-10-CM | POA: Diagnosis not present

## 2017-04-24 DIAGNOSIS — N183 Chronic kidney disease, stage 3 (moderate): Secondary | ICD-10-CM | POA: Diagnosis not present

## 2017-04-24 DIAGNOSIS — E119 Type 2 diabetes mellitus without complications: Secondary | ICD-10-CM | POA: Diagnosis not present

## 2017-04-24 DIAGNOSIS — E538 Deficiency of other specified B group vitamins: Secondary | ICD-10-CM | POA: Diagnosis not present

## 2017-04-24 DIAGNOSIS — E559 Vitamin D deficiency, unspecified: Secondary | ICD-10-CM | POA: Diagnosis not present

## 2017-04-24 DIAGNOSIS — I10 Essential (primary) hypertension: Secondary | ICD-10-CM | POA: Diagnosis not present

## 2017-04-24 DIAGNOSIS — E784 Other hyperlipidemia: Secondary | ICD-10-CM | POA: Diagnosis not present

## 2017-04-24 DIAGNOSIS — D638 Anemia in other chronic diseases classified elsewhere: Secondary | ICD-10-CM | POA: Diagnosis not present

## 2017-04-26 ENCOUNTER — Ambulatory Visit (HOSPITAL_COMMUNITY)
Admission: RE | Admit: 2017-04-26 | Discharge: 2017-04-26 | Disposition: A | Discharge status: Home / Self Care | Payer: Medicare Other | Source: Ambulatory Visit | Attending: Internal Medicine | Admitting: Internal Medicine

## 2017-04-26 DIAGNOSIS — I509 Heart failure, unspecified: Secondary | ICD-10-CM

## 2017-04-26 LAB — BASIC METABOLIC PANEL
Anion gap: 9 (ref 5–15)
BUN: 44 mg/dL — ABNORMAL HIGH (ref 6–20)
CALCIUM: 9.1 mg/dL (ref 8.9–10.3)
CO2: 23 mmol/L (ref 22–32)
CREATININE: 2.72 mg/dL — AB (ref 0.44–1.00)
Chloride: 110 mmol/L (ref 101–111)
GFR calc non Af Amer: 17 mL/min — ABNORMAL LOW (ref >60)
GFR, EST AFRICAN AMERICAN: 20 mL/min — AB (ref >60)
GLUCOSE: 108 mg/dL — AB (ref 65–99)
Potassium: 5.2 mmol/L — ABNORMAL HIGH (ref 3.5–5.1)
Sodium: 142 mmol/L (ref 135–145)

## 2017-05-04 DIAGNOSIS — I10 Essential (primary) hypertension: Secondary | ICD-10-CM | POA: Diagnosis not present

## 2017-05-04 DIAGNOSIS — E119 Type 2 diabetes mellitus without complications: Secondary | ICD-10-CM | POA: Diagnosis not present

## 2017-05-04 DIAGNOSIS — N189 Chronic kidney disease, unspecified: Secondary | ICD-10-CM | POA: Diagnosis not present

## 2017-05-04 DIAGNOSIS — D631 Anemia in chronic kidney disease: Secondary | ICD-10-CM | POA: Diagnosis not present

## 2017-05-04 DIAGNOSIS — N184 Chronic kidney disease, stage 4 (severe): Secondary | ICD-10-CM | POA: Diagnosis not present

## 2017-05-04 DIAGNOSIS — D649 Anemia, unspecified: Secondary | ICD-10-CM | POA: Diagnosis not present

## 2017-05-08 DIAGNOSIS — I119 Hypertensive heart disease without heart failure: Secondary | ICD-10-CM | POA: Diagnosis not present

## 2017-05-08 DIAGNOSIS — D638 Anemia in other chronic diseases classified elsewhere: Secondary | ICD-10-CM | POA: Diagnosis not present

## 2017-05-08 DIAGNOSIS — Z89611 Acquired absence of right leg above knee: Secondary | ICD-10-CM | POA: Diagnosis not present

## 2017-05-08 DIAGNOSIS — E784 Other hyperlipidemia: Secondary | ICD-10-CM | POA: Diagnosis not present

## 2017-05-08 DIAGNOSIS — N183 Chronic kidney disease, stage 3 (moderate): Secondary | ICD-10-CM | POA: Diagnosis not present

## 2017-05-08 DIAGNOSIS — E559 Vitamin D deficiency, unspecified: Secondary | ICD-10-CM | POA: Diagnosis not present

## 2017-05-08 DIAGNOSIS — R1013 Epigastric pain: Secondary | ICD-10-CM | POA: Diagnosis not present

## 2017-05-08 DIAGNOSIS — I10 Essential (primary) hypertension: Secondary | ICD-10-CM | POA: Diagnosis not present

## 2017-05-08 DIAGNOSIS — E119 Type 2 diabetes mellitus without complications: Secondary | ICD-10-CM | POA: Diagnosis not present

## 2017-05-08 DIAGNOSIS — E538 Deficiency of other specified B group vitamins: Secondary | ICD-10-CM | POA: Diagnosis not present

## 2017-06-14 DIAGNOSIS — N189 Chronic kidney disease, unspecified: Secondary | ICD-10-CM | POA: Diagnosis not present

## 2017-06-14 DIAGNOSIS — D649 Anemia, unspecified: Secondary | ICD-10-CM | POA: Diagnosis not present

## 2017-06-21 ENCOUNTER — Encounter (HOSPITAL_COMMUNITY): Payer: Medicare Other | Admitting: Internal Medicine

## 2017-06-27 ENCOUNTER — Other Ambulatory Visit: Payer: Self-pay | Admitting: Cardiology

## 2017-07-03 DIAGNOSIS — I10 Essential (primary) hypertension: Secondary | ICD-10-CM | POA: Diagnosis not present

## 2017-07-03 DIAGNOSIS — R1013 Epigastric pain: Secondary | ICD-10-CM | POA: Diagnosis not present

## 2017-07-03 DIAGNOSIS — D638 Anemia in other chronic diseases classified elsewhere: Secondary | ICD-10-CM | POA: Diagnosis not present

## 2017-07-03 DIAGNOSIS — Z2821 Immunization not carried out because of patient refusal: Secondary | ICD-10-CM | POA: Diagnosis not present

## 2017-07-03 DIAGNOSIS — N183 Chronic kidney disease, stage 3 (moderate): Secondary | ICD-10-CM | POA: Diagnosis not present

## 2017-07-03 DIAGNOSIS — Z89611 Acquired absence of right leg above knee: Secondary | ICD-10-CM | POA: Diagnosis not present

## 2017-07-03 DIAGNOSIS — E538 Deficiency of other specified B group vitamins: Secondary | ICD-10-CM | POA: Diagnosis not present

## 2017-07-03 DIAGNOSIS — E119 Type 2 diabetes mellitus without complications: Secondary | ICD-10-CM | POA: Diagnosis not present

## 2017-07-03 DIAGNOSIS — I119 Hypertensive heart disease without heart failure: Secondary | ICD-10-CM | POA: Diagnosis not present

## 2017-07-03 DIAGNOSIS — E559 Vitamin D deficiency, unspecified: Secondary | ICD-10-CM | POA: Diagnosis not present

## 2017-07-11 ENCOUNTER — Other Ambulatory Visit: Payer: Self-pay | Admitting: Cardiology

## 2017-09-01 DIAGNOSIS — E559 Vitamin D deficiency, unspecified: Secondary | ICD-10-CM | POA: Diagnosis not present

## 2017-09-01 DIAGNOSIS — I10 Essential (primary) hypertension: Secondary | ICD-10-CM | POA: Diagnosis not present

## 2017-09-01 DIAGNOSIS — R0602 Shortness of breath: Secondary | ICD-10-CM | POA: Diagnosis not present

## 2017-09-01 DIAGNOSIS — E538 Deficiency of other specified B group vitamins: Secondary | ICD-10-CM | POA: Diagnosis not present

## 2017-09-01 DIAGNOSIS — R1013 Epigastric pain: Secondary | ICD-10-CM | POA: Diagnosis not present

## 2017-09-01 DIAGNOSIS — Z89611 Acquired absence of right leg above knee: Secondary | ICD-10-CM | POA: Diagnosis not present

## 2017-09-01 DIAGNOSIS — N183 Chronic kidney disease, stage 3 (moderate): Secondary | ICD-10-CM | POA: Diagnosis not present

## 2017-09-01 DIAGNOSIS — E119 Type 2 diabetes mellitus without complications: Secondary | ICD-10-CM | POA: Diagnosis not present

## 2017-09-01 DIAGNOSIS — Z2821 Immunization not carried out because of patient refusal: Secondary | ICD-10-CM | POA: Diagnosis not present

## 2017-09-01 DIAGNOSIS — I119 Hypertensive heart disease without heart failure: Secondary | ICD-10-CM | POA: Diagnosis not present

## 2017-09-01 DIAGNOSIS — D638 Anemia in other chronic diseases classified elsewhere: Secondary | ICD-10-CM | POA: Diagnosis not present

## 2017-11-21 ENCOUNTER — Other Ambulatory Visit: Payer: Self-pay | Admitting: *Deleted

## 2017-11-22 MED ORDER — HYDRALAZINE HCL 50 MG PO TABS: 50.0000 mg | ORAL_TABLET | Freq: Three times a day (TID) | ORAL | 2 refills | Status: DC

## 2017-12-01 DIAGNOSIS — I119 Hypertensive heart disease without heart failure: Secondary | ICD-10-CM | POA: Diagnosis not present

## 2017-12-01 DIAGNOSIS — E119 Type 2 diabetes mellitus without complications: Secondary | ICD-10-CM | POA: Diagnosis not present

## 2017-12-01 DIAGNOSIS — D638 Anemia in other chronic diseases classified elsewhere: Secondary | ICD-10-CM | POA: Diagnosis not present

## 2017-12-01 DIAGNOSIS — Z89611 Acquired absence of right leg above knee: Secondary | ICD-10-CM | POA: Diagnosis not present

## 2017-12-01 DIAGNOSIS — E785 Hyperlipidemia, unspecified: Secondary | ICD-10-CM | POA: Diagnosis not present

## 2017-12-01 DIAGNOSIS — E538 Deficiency of other specified B group vitamins: Secondary | ICD-10-CM | POA: Diagnosis not present

## 2017-12-01 DIAGNOSIS — I10 Essential (primary) hypertension: Secondary | ICD-10-CM | POA: Diagnosis not present

## 2017-12-01 DIAGNOSIS — N183 Chronic kidney disease, stage 3 (moderate): Secondary | ICD-10-CM | POA: Diagnosis not present

## 2017-12-01 DIAGNOSIS — R1013 Epigastric pain: Secondary | ICD-10-CM | POA: Diagnosis not present

## 2017-12-01 DIAGNOSIS — E559 Vitamin D deficiency, unspecified: Secondary | ICD-10-CM | POA: Diagnosis not present

## 2017-12-25 DIAGNOSIS — I119 Hypertensive heart disease without heart failure: Secondary | ICD-10-CM | POA: Diagnosis not present

## 2017-12-25 DIAGNOSIS — D638 Anemia in other chronic diseases classified elsewhere: Secondary | ICD-10-CM | POA: Diagnosis not present

## 2017-12-25 DIAGNOSIS — Z89611 Acquired absence of right leg above knee: Secondary | ICD-10-CM | POA: Diagnosis not present

## 2017-12-25 DIAGNOSIS — I10 Essential (primary) hypertension: Secondary | ICD-10-CM | POA: Diagnosis not present

## 2017-12-25 DIAGNOSIS — E559 Vitamin D deficiency, unspecified: Secondary | ICD-10-CM | POA: Diagnosis not present

## 2017-12-25 DIAGNOSIS — N183 Chronic kidney disease, stage 3 (moderate): Secondary | ICD-10-CM | POA: Diagnosis not present

## 2017-12-25 DIAGNOSIS — E538 Deficiency of other specified B group vitamins: Secondary | ICD-10-CM | POA: Diagnosis not present

## 2017-12-25 DIAGNOSIS — E119 Type 2 diabetes mellitus without complications: Secondary | ICD-10-CM | POA: Diagnosis not present

## 2017-12-25 DIAGNOSIS — E785 Hyperlipidemia, unspecified: Secondary | ICD-10-CM | POA: Diagnosis not present

## 2017-12-25 DIAGNOSIS — R1013 Epigastric pain: Secondary | ICD-10-CM | POA: Diagnosis not present

## 2018-01-11 ENCOUNTER — Encounter (HOSPITAL_COMMUNITY): Payer: Self-pay | Admitting: Emergency Medicine

## 2018-01-11 ENCOUNTER — Emergency Department (HOSPITAL_COMMUNITY): Payer: Medicare Other

## 2018-01-11 ENCOUNTER — Inpatient Hospital Stay (HOSPITAL_COMMUNITY)
Admission: EM | Admit: 2018-01-11 | Discharge: 2018-01-15 | DRG: 281 | Disposition: A | Discharge status: Home / Self Care | Payer: Medicare Other | Attending: Internal Medicine | Admitting: Internal Medicine

## 2018-01-11 ENCOUNTER — Other Ambulatory Visit: Payer: Self-pay

## 2018-01-11 DIAGNOSIS — I130 Hypertensive heart and chronic kidney disease with heart failure and stage 1 through stage 4 chronic kidney disease, or unspecified chronic kidney disease: Secondary | ICD-10-CM | POA: Diagnosis present

## 2018-01-11 DIAGNOSIS — E1151 Type 2 diabetes mellitus with diabetic peripheral angiopathy without gangrene: Secondary | ICD-10-CM | POA: Diagnosis present

## 2018-01-11 DIAGNOSIS — Z89611 Acquired absence of right leg above knee: Secondary | ICD-10-CM | POA: Diagnosis not present

## 2018-01-11 DIAGNOSIS — I10 Essential (primary) hypertension: Secondary | ICD-10-CM

## 2018-01-11 DIAGNOSIS — I5042 Chronic combined systolic (congestive) and diastolic (congestive) heart failure: Secondary | ICD-10-CM | POA: Diagnosis present

## 2018-01-11 DIAGNOSIS — K219 Gastro-esophageal reflux disease without esophagitis: Secondary | ICD-10-CM | POA: Diagnosis present

## 2018-01-11 DIAGNOSIS — Z794 Long term (current) use of insulin: Secondary | ICD-10-CM

## 2018-01-11 DIAGNOSIS — R000 Tachycardia, unspecified: Secondary | ICD-10-CM | POA: Diagnosis present

## 2018-01-11 DIAGNOSIS — Z833 Family history of diabetes mellitus: Secondary | ICD-10-CM

## 2018-01-11 DIAGNOSIS — F419 Anxiety disorder, unspecified: Secondary | ICD-10-CM | POA: Diagnosis present

## 2018-01-11 DIAGNOSIS — M1990 Unspecified osteoarthritis, unspecified site: Secondary | ICD-10-CM | POA: Diagnosis present

## 2018-01-11 DIAGNOSIS — Z515 Encounter for palliative care: Secondary | ICD-10-CM

## 2018-01-11 DIAGNOSIS — N185 Chronic kidney disease, stage 5: Secondary | ICD-10-CM | POA: Diagnosis not present

## 2018-01-11 DIAGNOSIS — E785 Hyperlipidemia, unspecified: Secondary | ICD-10-CM | POA: Diagnosis present

## 2018-01-11 DIAGNOSIS — D631 Anemia in chronic kidney disease: Secondary | ICD-10-CM | POA: Diagnosis present

## 2018-01-11 DIAGNOSIS — E1122 Type 2 diabetes mellitus with diabetic chronic kidney disease: Secondary | ICD-10-CM | POA: Diagnosis present

## 2018-01-11 DIAGNOSIS — I252 Old myocardial infarction: Secondary | ICD-10-CM

## 2018-01-11 DIAGNOSIS — Z87891 Personal history of nicotine dependence: Secondary | ICD-10-CM

## 2018-01-11 DIAGNOSIS — R112 Nausea with vomiting, unspecified: Secondary | ICD-10-CM | POA: Diagnosis not present

## 2018-01-11 DIAGNOSIS — R0602 Shortness of breath: Secondary | ICD-10-CM | POA: Diagnosis not present

## 2018-01-11 DIAGNOSIS — I5022 Chronic systolic (congestive) heart failure: Secondary | ICD-10-CM | POA: Diagnosis not present

## 2018-01-11 DIAGNOSIS — R079 Chest pain, unspecified: Secondary | ICD-10-CM | POA: Diagnosis not present

## 2018-01-11 DIAGNOSIS — Z8673 Personal history of transient ischemic attack (TIA), and cerebral infarction without residual deficits: Secondary | ICD-10-CM | POA: Diagnosis not present

## 2018-01-11 DIAGNOSIS — I2510 Atherosclerotic heart disease of native coronary artery without angina pectoris: Secondary | ICD-10-CM | POA: Diagnosis present

## 2018-01-11 DIAGNOSIS — Z79899 Other long term (current) drug therapy: Secondary | ICD-10-CM

## 2018-01-11 DIAGNOSIS — Z885 Allergy status to narcotic agent status: Secondary | ICD-10-CM

## 2018-01-11 DIAGNOSIS — R0789 Other chest pain: Secondary | ICD-10-CM | POA: Diagnosis present

## 2018-01-11 DIAGNOSIS — I272 Pulmonary hypertension, unspecified: Secondary | ICD-10-CM | POA: Diagnosis present

## 2018-01-11 DIAGNOSIS — I214 Non-ST elevation (NSTEMI) myocardial infarction: Principal | ICD-10-CM | POA: Diagnosis present

## 2018-01-11 DIAGNOSIS — R011 Cardiac murmur, unspecified: Secondary | ICD-10-CM | POA: Diagnosis present

## 2018-01-11 DIAGNOSIS — Z992 Dependence on renal dialysis: Secondary | ICD-10-CM

## 2018-01-11 DIAGNOSIS — Z7189 Other specified counseling: Secondary | ICD-10-CM | POA: Diagnosis not present

## 2018-01-11 DIAGNOSIS — N186 End stage renal disease: Secondary | ICD-10-CM

## 2018-01-11 DIAGNOSIS — Z886 Allergy status to analgesic agent status: Secondary | ICD-10-CM

## 2018-01-11 DIAGNOSIS — N171 Acute kidney failure with acute cortical necrosis: Secondary | ICD-10-CM

## 2018-01-11 DIAGNOSIS — E119 Type 2 diabetes mellitus without complications: Secondary | ICD-10-CM | POA: Diagnosis not present

## 2018-01-11 DIAGNOSIS — N184 Chronic kidney disease, stage 4 (severe): Secondary | ICD-10-CM | POA: Diagnosis not present

## 2018-01-11 DIAGNOSIS — N179 Acute kidney failure, unspecified: Secondary | ICD-10-CM | POA: Diagnosis present

## 2018-01-11 DIAGNOSIS — Z87730 Personal history of (corrected) cleft lip and palate: Secondary | ICD-10-CM

## 2018-01-11 DIAGNOSIS — Z888 Allergy status to other drugs, medicaments and biological substances status: Secondary | ICD-10-CM

## 2018-01-11 DIAGNOSIS — H18419 Arcus senilis, unspecified eye: Secondary | ICD-10-CM | POA: Diagnosis present

## 2018-01-11 DIAGNOSIS — Z7982 Long term (current) use of aspirin: Secondary | ICD-10-CM

## 2018-01-11 DIAGNOSIS — N189 Chronic kidney disease, unspecified: Secondary | ICD-10-CM | POA: Diagnosis present

## 2018-01-11 HISTORY — DX: Acquired absence of right leg above knee: Z89.611

## 2018-01-11 LAB — HEPATIC FUNCTION PANEL
ALBUMIN: 3.4 g/dL — AB (ref 3.5–5.0)
ALT: 16 U/L (ref 14–54)
AST: 23 U/L (ref 15–41)
Alkaline Phosphatase: 109 U/L (ref 38–126)
BILIRUBIN TOTAL: 0.7 mg/dL (ref 0.3–1.2)
Bilirubin, Direct: 0.1 mg/dL (ref 0.1–0.5)
Indirect Bilirubin: 0.6 mg/dL (ref 0.3–0.9)
TOTAL PROTEIN: 7.2 g/dL (ref 6.5–8.1)

## 2018-01-11 LAB — BASIC METABOLIC PANEL
ANION GAP: 12 (ref 5–15)
BUN: 46 mg/dL — ABNORMAL HIGH (ref 6–20)
CALCIUM: 9.3 mg/dL (ref 8.9–10.3)
CO2: 19 mmol/L — ABNORMAL LOW (ref 22–32)
Chloride: 109 mmol/L (ref 101–111)
Creatinine, Ser: 3.67 mg/dL — ABNORMAL HIGH (ref 0.44–1.00)
GFR, EST AFRICAN AMERICAN: 14 mL/min — AB (ref >60)
GFR, EST NON AFRICAN AMERICAN: 12 mL/min — AB (ref >60)
Glucose, Bld: 257 mg/dL — ABNORMAL HIGH (ref 65–99)
POTASSIUM: 5.1 mmol/L (ref 3.5–5.1)
SODIUM: 140 mmol/L (ref 135–145)

## 2018-01-11 LAB — GLUCOSE, CAPILLARY
GLUCOSE-CAPILLARY: 124 mg/dL — AB (ref 65–99)
Glucose-Capillary: 162 mg/dL — ABNORMAL HIGH (ref 65–99)

## 2018-01-11 LAB — TROPONIN I
TROPONIN I: 6.81 ng/mL — AB (ref <0.03)
Troponin I: 9.71 ng/mL (ref <0.03)
Troponin I: 10.54 ng/mL (ref <0.03)

## 2018-01-11 LAB — I-STAT TROPONIN, ED: TROPONIN I, POC: 0.22 ng/mL — AB (ref 0.00–0.08)

## 2018-01-11 LAB — TSH: TSH: 1.778 u[IU]/mL (ref 0.350–4.500)

## 2018-01-11 LAB — CBC
HEMATOCRIT: 35.2 % — AB (ref 36.0–46.0)
HEMOGLOBIN: 10.8 g/dL — AB (ref 12.0–15.0)
MCH: 26.2 pg (ref 26.0–34.0)
MCHC: 30.7 g/dL (ref 30.0–36.0)
MCV: 85.4 fL (ref 78.0–100.0)
Platelets: 329 10*3/uL (ref 150–400)
RBC: 4.12 MIL/uL (ref 3.87–5.11)
RDW: 13.1 % (ref 11.5–15.5)
WBC: 11 10*3/uL — AB (ref 4.0–10.5)

## 2018-01-11 LAB — CBG MONITORING, ED: Glucose-Capillary: 146 mg/dL — ABNORMAL HIGH (ref 65–99)

## 2018-01-11 LAB — BRAIN NATRIURETIC PEPTIDE: B NATRIURETIC PEPTIDE 5: 61.2 pg/mL (ref 0.0–100.0)

## 2018-01-11 LAB — HEPARIN LEVEL (UNFRACTIONATED): HEPARIN UNFRACTIONATED: 0.57 [IU]/mL (ref 0.30–0.70)

## 2018-01-11 LAB — HEMOGLOBIN A1C
HEMOGLOBIN A1C: 6.8 % — AB (ref 4.8–5.6)
MEAN PLASMA GLUCOSE: 148.46 mg/dL

## 2018-01-11 LAB — LIPASE, BLOOD: LIPASE: 46 U/L (ref 11–51)

## 2018-01-11 MED ORDER — SODIUM CHLORIDE 0.9 % IV SOLN: 250.0000 mL | INTRAVENOUS | Status: DC | PRN

## 2018-01-11 MED ORDER — NEPRO/CARBSTEADY PO LIQD
237.0000 mL | Freq: Three times a day (TID) | ORAL | Status: DC | PRN
  Filled 2018-01-11: qty 237

## 2018-01-11 MED ORDER — MORPHINE SULFATE (PF) 4 MG/ML IV SOLN: 2.0000 mg | INTRAVENOUS | Status: DC | PRN

## 2018-01-11 MED ORDER — NITROGLYCERIN IN D5W 200-5 MCG/ML-% IV SOLN
0.0000 ug/min | Freq: Once | INTRAVENOUS | Status: AC
  Administered 2018-01-11: 5 ug/min via INTRAVENOUS
  Filled 2018-01-11: qty 250

## 2018-01-11 MED ORDER — ATORVASTATIN CALCIUM 80 MG PO TABS
80.0000 mg | ORAL_TABLET | Freq: Every day | ORAL | Status: DC
  Administered 2018-01-11 – 2018-01-14 (×4): 80 mg via ORAL
  Filled 2018-01-11 (×4): qty 1

## 2018-01-11 MED ORDER — INSULIN ASPART 100 UNIT/ML ~~LOC~~ SOLN: 0.0000 [IU] | Freq: Every day | SUBCUTANEOUS | Status: DC

## 2018-01-11 MED ORDER — CARVEDILOL 6.25 MG PO TABS
18.7500 mg | ORAL_TABLET | Freq: Two times a day (BID) | ORAL | Status: DC
  Administered 2018-01-11 – 2018-01-12 (×2): 18.75 mg via ORAL
  Filled 2018-01-11 (×2): qty 1

## 2018-01-11 MED ORDER — FUROSEMIDE 40 MG PO TABS
40.0000 mg | ORAL_TABLET | Freq: Every day | ORAL | Status: DC
  Administered 2018-01-11 – 2018-01-12 (×2): 40 mg via ORAL
  Filled 2018-01-11: qty 2
  Filled 2018-01-11: qty 1

## 2018-01-11 MED ORDER — HEPARIN BOLUS VIA INFUSION
3000.0000 [IU] | Freq: Once | INTRAVENOUS | Status: AC
  Administered 2018-01-11: 3000 [IU] via INTRAVENOUS
  Filled 2018-01-11: qty 3000

## 2018-01-11 MED ORDER — CAMPHOR-MENTHOL 0.5-0.5 % EX LOTN
1.0000 "application " | TOPICAL_LOTION | Freq: Three times a day (TID) | CUTANEOUS | Status: DC | PRN
  Filled 2018-01-11: qty 222

## 2018-01-11 MED ORDER — AMLODIPINE BESYLATE 5 MG PO TABS
7.5000 mg | ORAL_TABLET | Freq: Every day | ORAL | Status: DC
  Administered 2018-01-11 – 2018-01-12 (×2): 7.5 mg via ORAL
  Filled 2018-01-11 (×2): qty 2

## 2018-01-11 MED ORDER — SODIUM CHLORIDE 0.9% FLUSH: 3.0000 mL | INTRAVENOUS | Status: DC | PRN

## 2018-01-11 MED ORDER — HYDRALAZINE HCL 50 MG PO TABS
50.0000 mg | ORAL_TABLET | Freq: Three times a day (TID) | ORAL | Status: DC
  Administered 2018-01-11: 50 mg via ORAL
  Filled 2018-01-11: qty 1

## 2018-01-11 MED ORDER — SODIUM CHLORIDE 0.9% FLUSH: 3.0000 mL | Freq: Two times a day (BID) | INTRAVENOUS | Status: DC

## 2018-01-11 MED ORDER — NITROGLYCERIN IN D5W 200-5 MCG/ML-% IV SOLN
0.0000 ug/min | INTRAVENOUS | Status: DC
  Administered 2018-01-11: 20 ug/min via INTRAVENOUS
  Administered 2018-01-12: 30 ug/min via INTRAVENOUS
  Administered 2018-01-13: 25 ug/min via INTRAVENOUS
  Filled 2018-01-11: qty 250

## 2018-01-11 MED ORDER — AMLODIPINE BESYLATE 5 MG PO TABS: 5.0000 mg | ORAL_TABLET | Freq: Every day | ORAL | Status: DC

## 2018-01-11 MED ORDER — INSULIN ASPART 100 UNIT/ML ~~LOC~~ SOLN
0.0000 [IU] | Freq: Three times a day (TID) | SUBCUTANEOUS | Status: DC
  Administered 2018-01-11: 3 [IU] via SUBCUTANEOUS
  Administered 2018-01-11 – 2018-01-12 (×2): 2 [IU] via SUBCUTANEOUS
  Administered 2018-01-12: 3 [IU] via SUBCUTANEOUS
  Administered 2018-01-13: 2 [IU] via SUBCUTANEOUS
  Administered 2018-01-14: 5 [IU] via SUBCUTANEOUS
  Administered 2018-01-14: 2 [IU] via SUBCUTANEOUS
  Filled 2018-01-11: qty 1

## 2018-01-11 MED ORDER — HYDRALAZINE HCL 20 MG/ML IJ SOLN: 5.0000 mg | INTRAMUSCULAR | Status: DC | PRN

## 2018-01-11 MED ORDER — CARVEDILOL 12.5 MG PO TABS: 12.5000 mg | ORAL_TABLET | Freq: Two times a day (BID) | ORAL | Status: DC

## 2018-01-11 MED ORDER — DOCUSATE SODIUM 283 MG RE ENEM
1.0000 | ENEMA | RECTAL | Status: DC | PRN
  Filled 2018-01-11: qty 1

## 2018-01-11 MED ORDER — SORBITOL 70 % SOLN
30.0000 mL | Status: DC | PRN
  Administered 2018-01-12: 30 mL via ORAL
  Filled 2018-01-11 (×3): qty 30

## 2018-01-11 MED ORDER — HYDROXYZINE HCL 25 MG PO TABS: 25.0000 mg | ORAL_TABLET | Freq: Three times a day (TID) | ORAL | Status: DC | PRN

## 2018-01-11 MED ORDER — ASPIRIN 81 MG PO CHEW
81.0000 mg | CHEWABLE_TABLET | Freq: Every day | ORAL | Status: DC
  Administered 2018-01-12 – 2018-01-15 (×4): 81 mg via ORAL
  Filled 2018-01-11 (×5): qty 1

## 2018-01-11 MED ORDER — ONDANSETRON HCL 4 MG/2ML IJ SOLN
4.0000 mg | Freq: Once | INTRAMUSCULAR | Status: AC
  Administered 2018-01-11: 4 mg via INTRAVENOUS
  Filled 2018-01-11: qty 2

## 2018-01-11 MED ORDER — TICAGRELOR 90 MG PO TABS
90.0000 mg | ORAL_TABLET | Freq: Two times a day (BID) | ORAL | Status: DC
  Administered 2018-01-11 – 2018-01-15 (×8): 90 mg via ORAL
  Filled 2018-01-11 (×8): qty 1

## 2018-01-11 MED ORDER — ONDANSETRON HCL 4 MG PO TABS: 4.0000 mg | ORAL_TABLET | Freq: Four times a day (QID) | ORAL | Status: DC | PRN

## 2018-01-11 MED ORDER — ONDANSETRON HCL 4 MG/2ML IJ SOLN: 4.0000 mg | Freq: Four times a day (QID) | INTRAMUSCULAR | Status: DC | PRN

## 2018-01-11 MED ORDER — INSULIN GLARGINE 100 UNIT/ML ~~LOC~~ SOLN
25.0000 [IU] | Freq: Two times a day (BID) | SUBCUTANEOUS | Status: DC
  Administered 2018-01-11 – 2018-01-14 (×8): 25 [IU] via SUBCUTANEOUS
  Filled 2018-01-11 (×10): qty 0.25

## 2018-01-11 MED ORDER — HEPARIN (PORCINE) IN NACL 100-0.45 UNIT/ML-% IJ SOLN
900.0000 [IU]/h | INTRAMUSCULAR | Status: DC
  Administered 2018-01-11 – 2018-01-13 (×4): 900 [IU]/h via INTRAVENOUS
  Filled 2018-01-11 (×3): qty 250

## 2018-01-11 MED ORDER — MORPHINE SULFATE (PF) 4 MG/ML IV SOLN
2.0000 mg | Freq: Once | INTRAVENOUS | Status: AC
  Administered 2018-01-11: 2 mg via INTRAVENOUS
  Filled 2018-01-11: qty 1

## 2018-01-11 MED ORDER — CALCIUM CARBONATE ANTACID 1250 MG/5ML PO SUSP
500.0000 mg | Freq: Four times a day (QID) | ORAL | Status: DC | PRN
  Filled 2018-01-11: qty 5

## 2018-01-11 MED ORDER — ALPRAZOLAM 0.25 MG PO TABS: 0.2500 mg | ORAL_TABLET | Freq: Three times a day (TID) | ORAL | Status: DC | PRN

## 2018-01-11 MED ORDER — ZOLPIDEM TARTRATE 5 MG PO TABS: 5.0000 mg | ORAL_TABLET | Freq: Every evening | ORAL | Status: DC | PRN

## 2018-01-11 NOTE — ED Notes (Signed)
Patient transported to X-ray 

## 2018-01-11 NOTE — ED Notes (Signed)
Spoke with main pharmacy in regards to Lantus, they will verify and send.

## 2018-01-11 NOTE — ED Provider Notes (Signed)
[place] EMERGENCY DEPARTMENT Provider Note   CSN: [id number] Arrival date & time: 01/11/18  0340     History   Chief Complaint Chief Complaint  Patient presents with  . Chest Pain    HPI Jenna Brown is a 67 y.o. female.  Patient presents to the emergency department for evaluation of chest pain.  Patient reports that her pain began around 11 PM.  Patient reports of pain in the center of her chest that does radiate to the left arm.  She has had mild shortness of breath.  She has had nausea and has vomited at least one time.  Patient brought to the ER from home by ambulance.  She received aspirin and nitroglycerin during transport.  She reports improvement with the nitroglycerin, but when she got here to the ER pain started to worsen again.     Past Medical History:  Diagnosis Date  . Anemia   . Anxiety    takes Xanax prn  . Arthritis   . CKD (chronic kidney disease), stage IV ([place])   . Coronary artery disease   . GERD (gastroesophageal reflux disease)   . H/O hiatal hernia   . Headache(784.0)    when b/p is elevated  . Hemorrhoids   . Hyperlipidemia    takes Simvastatin daily  . Hypertension    takes Amlodipine/HCTZ/Losartan daily  . Migraines   . Myocardial infarction ([place]) 1990's  . Peripheral vascular disease ([place])   . PONV (postoperative nausea and vomiting)   . Stroke [facility])    TIA history  . Type II diabetes mellitus (HCC)    takes Glucovance and Januvia daily  . Vertigo    takes Ativert prn    Patient Active Problem List   Diagnosis Date Noted  . Cardiac mass   . Chronic kidney disease (CKD), stage IV (severe) ([place]) 12/26/2016  . CKD (chronic kidney disease), stage IV ([place])   . Hypertensive emergency   . CAD in native artery   . Cardiomyopathy, ischemic 05/14/2015  . TIA (transient ischemic attack) 05/14/2015  . PAD (peripheral artery disease) ([place]) 05/14/2015  . Chronic systolic heart failure ([place]) 05/14/2015  . Coronary  artery disease due to lipid rich plaque   . Cardiac enzymes elevated   . NSTEMI (non-ST elevated myocardial infarction) ([place])   . Essential hypertension   . HLD (hyperlipidemia)   . Diabetes mellitus, insulin dependent (IDDM), uncontrolled ([place])   . Acute kidney injury ([place])   . Anemia 05/10/2012  . 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 Stroke ([place])   . Peripheral vascular disease [facility])     Past Surgical History:  Procedure Laterality Date  . ABDOMINAL AORTAGRAM N/A 02/29/2012   Procedure: ABDOMINAL Maxcine Ham;  Surgeon: [name], MD;  Location: [facility] CATH LAB;  Service: Cardiovascular;  Laterality: N/A;  . AMPUTATION  05/10/2012   Procedure: AMPUTATION ABOVE KNEE;  Surgeon: [name], MD;  Location: [place] OR;  Service: Vascular;  Laterality: Right;   open right groin wound noted  . aortogram  02/2012  . CARDIAC CATHETERIZATION N/A 04/13/2015   Procedure: Right/Left Heart Cath and Coronary Angiography;  Surgeon: [name], MD;  Location: [place] CV LAB;  Service: Cardiovascular;  Laterality: N/A;  . CLEFT PALATE REPAIR     several  . COLONOSCOPY    . DILATION AND CURETTAGE OF UTERUS    . FEMORAL-POPLITEAL BYPASS GRAFT  04/05/2012   Procedure: BYPASS GRAFT  FEMORAL-POPLITEAL ARTERY;  Surgeon: [name], MD;  Location: [place];  Service: Vascular;  Laterality: Right;  REVISION  . FEMORAL-TIBIAL BYPASS GRAFT  03/29/2012   Procedure: BYPASS GRAFT FEMORAL-TIBIAL ARTERY;  Surgeon: [name], MD;  Location: [place] OR;  Service: Vascular;  Laterality: Right;  Right femoral to Posterior Tibialis with composite graft of 80mm x 80 cm ringed gortex graft and saphenous vein  ,intraoperative arteriogram  . FEMOROPOPLITEAL THROMBECTOMY / EMBOLECTOMY  05/09/2012  . MYOMECTOMY    . PR VEIN BYPASS GRAFT,AORTO-FEM-POP  05/27/11   Right SFA-Below knee Pop BP  . TEE WITHOUT CARDIOVERSION N/A 01/03/2017   Procedure: TRANSESOPHAGEAL ECHOCARDIOGRAM (TEE);   Surgeon: Skeet Latch, MD;  Location: [facility] ENDOSCOPY;  Service: Cardiovascular;  Laterality: N/A;  . TONSILLECTOMY    . TUBAL LIGATION  ~ 1990     OB History   None      Home Medications    Prior to Admission medications   Medication Sig Start Date End Date Taking? Authorizing Provider  acetaminophen (TYLENOL) 650 MG CR tablet Take 650 mg by mouth every 8 (eight) hours as needed for pain.     [provider]  ALPRAZolam Duanne Moron) 0.25 MG tablet Take 1 tablet (0.25 mg total) by mouth 3 (three) times daily as needed for anxiety. 01/03/17   [place] Haring, PA-C  aspirin 81 MG chewable tablet Chew 81 mg by mouth daily.    [provider]  carvedilol (COREG) 12.5 MG tablet Take 1 tablet (12.5 mg total) by mouth 2 (two) times daily with a meal. 01/03/17   Del[place] Haring, PA-C  cholecalciferol (VITAMIN D) 1000 units tablet Take 1,000 Units by mouth daily.    [provider]  furosemide (LASIX) 40 MG tablet Take 1 tablet (40 mg total) by mouth daily. 05/14/15   Clegg, Amy D, NP  hydrALAZINE (APRESOLINE) 25 MG tablet Take 50 mg by mouth 2 (two) times daily.    [provider]  hydrALAZINE (APRESOLINE) 50 MG tablet Take 1 tablet (50 mg total) by mouth 3 (three) times daily. 11/22/17   Bensimhon, Shaune Pascal, MD  insulin aspart (NOVOLOG) 100 UNIT/ML injection Inject 5 Units into the skin 3 (three) times daily before meals. 01/03/17   [place] Haring, PA-C  insulin glargine (LANTUS) 100 UNIT/ML injection Inject 0.25 mLs (25 Units total) into the skin 2 (two) times daily. 01/03/17   [place] Haring, PA-C  Insulin Pen Needle (AURORA PEN NEEDLES) 29G X 12MM MISC 5 Units by Does not apply route 3 (three) times daily before meals. 05/16/15   Ricard Dillon, MD  isosorbide mononitrate (IMDUR) 30 MG 24 hr tablet Take 2 tabs in AM and 1 tab in PM 04/03/17   Larey Dresser, MD  isosorbide mononitrate (IMDUR) 30 MG 24 hr tablet take 3 tablets by mouth once daily 07/11/17    Bensimhon, Shaune Pascal, MD  losartan (COZAAR) 50 MG tablet Take 1 tablet (50 mg total) by mouth daily. 03/30/17   Cheryln Manly, NP  nitroGLYCERIN (NITROSTAT) 0.4 MG SL tablet Place 1 tablet (0.4 mg total) under the tongue every 5 (five) minutes x 3 doses as needed for chest pain. Patient not taking: Reported on 04/19/2017 08/13/15   Theora Gianotti, NP  pravastatin (PRAVACHOL) 40 MG tablet Take 40 mg by mouth daily.    [provider]  ticagrelor (BRILINTA) 90 MG TABS tablet Take 1 tablet (90 mg total) by mouth 2 (two) times daily. 01/03/17   [place] Haring,  PA-C    Family History Family History  Problem Relation Age of Onset  . Cancer Mother        breast  . Diabetes Father     Social History Social History   Tobacco Use  . Smoking status: Former Smoker    Packs/day: 0.25    Years: 40.00    Pack years: 10.00    Types: Cigarettes    Last attempt to quit: 02/27/2012    Years since quitting: 5.8  . Smokeless tobacco: Never Used  Substance Use Topics  . Alcohol use: No  . Drug use: No     Allergies   Plavix [clopidogrel bisulfate]; Codeine; Tylenol with codeine #3 [acetaminophen-codeine]; and Tylenol [acetaminophen]   Review of Systems Review of Systems  Respiratory: Positive for shortness of breath.   Cardiovascular: Positive for chest pain.  Gastrointestinal: Positive for nausea and vomiting.  All other systems reviewed and are negative.    Physical Exam Updated Vital Signs BP (!) 193/106 (BP Location: Right Arm)   Pulse (!) 114   Temp 98.5 F (36.9 C) (Oral)   Resp (!) 30   SpO2 98%   Physical Exam  Constitutional: She is oriented to person, place, and time. She appears well-developed and well-nourished. No distress.  HENT:  Head: Normocephalic and atraumatic.  Right Ear: Hearing normal.  Left Ear: Hearing normal.  Nose: Nose normal.  Mouth/Throat: Oropharynx is clear and moist and mucous membranes are normal.  Eyes: Pupils are equal,  round, and reactive to light. Conjunctivae and EOM are normal.  Neck: Normal range of motion. Neck supple.  Cardiovascular: Regular rhythm, S1 normal and S2 normal. Exam reveals no gallop and no friction rub.  No murmur heard. Pulmonary/Chest: Effort normal and breath sounds normal. No respiratory distress. She exhibits no tenderness.  Abdominal: Soft. Normal appearance and bowel sounds are normal. There is no hepatosplenomegaly. There is no tenderness. There is no rebound, no guarding, no tenderness at McBurney's point and negative Murphy's sign. No hernia.  Musculoskeletal: Normal range of motion.  Neurological: She is alert and oriented to person, place, and time. She has normal strength. No cranial nerve deficit or sensory deficit. Coordination normal. GCS eye subscore is 4. GCS verbal subscore is 5. GCS motor subscore is 6.  Skin: Skin is warm, dry and intact. No rash noted. No cyanosis.  Psychiatric: She has a normal mood and affect. Her speech is normal and behavior is normal. Thought content normal.  Nursing note and vitals reviewed.    ED Treatments / Results  Labs (all labs ordered are listed, but only abnormal results are displayed) Labs Reviewed  CBC - Abnormal; Notable for the following components:      Result Value   WBC 11.0 (*)    Hemoglobin 10.8 (*)    HCT 35.2 (*)    All other components within normal limits  I-STAT TROPONIN, ED - Abnormal; Notable for the following components:   Troponin i, poc 0.22 (*)    All other components within normal limits  BASIC METABOLIC PANEL  HEPATIC FUNCTION PANEL  LIPASE, BLOOD  BRAIN NATRIURETIC PEPTIDE    EKG EKG Interpretation  Date/Time:  Thursday January 11 2018 04:04:20 EDT Ventricular Rate:  119 PR Interval:  120 QRS Duration: 100 QT Interval:  330 QTC Calculation: 464 R Axis:   82 Text Interpretation:  Sinus tachycardia Marked ST abnormality, possible inferior subendocardial injury , new Abnormal ECG Confirmed by  Orpah Greek [phone]) on 01/11/2018 4:24:50  AM   Radiology Dg Chest 2 View  Result Date: 01/11/2018 CLINICAL DATA:  Chest pain, nausea, hypertensive. EXAM: CHEST - 2 VIEW COMPARISON:  Chest radiograph March 23, 2017 FINDINGS: Cardiomediastinal silhouette is unremarkable for this low inspiratory examination with crowded vasculature markings. The lungs are clear without pleural effusions or focal consolidations. Persistently elevated RIGHT hemidiaphragm. Trachea projects midline and there is no pneumothorax. Included soft tissue planes and osseous structures are non-suspicious. IMPRESSION: No active cardiopulmonary disease. Electronically Signed   By: Elon Alas M.D.   On: 01/11/2018 04:56    Procedures Procedures (including critical care time)  Medications Ordered in ED Medications  morphine 4 MG/ML injection 2 mg (has no administration in time range)  ondansetron (ZOFRAN) injection 4 mg (has no administration in time range)  nitroGLYCERIN 50 mg in dextrose 5 % 250 mL (0.2 mg/mL) infusion (has no administration in time range)  heparin bolus via infusion 3,000 Units (has no administration in time range)  heparin ADULT infusion 100 units/mL (25000 units/227mL sodium chloride 0.45%) (has no administration in time range)     Initial Impression / Assessment and Plan / ED Course  I have reviewed the triage vital signs and the nursing notes.  Pertinent labs & imaging results that were available during my care of the patient were reviewed by me and considered in my medical decision making (see chart for details).     Patient presents to the ER from home for evaluation of chest pain.  Patient has a history of severe coronary artery disease.  Reviewing her records reveals multiple and NSTEMI's.  She has three-vessel disease documented in 2016 by cardiac catheterization, however, she has not had any further catheterizations because of stage IV kidney disease.  She has been medical  management to this point.  Patient does have downsloping ST depressions inferiorly, likely ischemia.  She has a mildly elevated troponin.  Discussed with Dr. Teena Dunk, on-call for cardiology.  Recommends medical management, agrees with heparin and nitroglycerin, hydralazine as needed for her significant blood pressure elevation.  As she is not a candidate for intervention (neither PCI nor CABG) recommends medicine admission.  CRITICAL CARE Performed by: Orpah Greek   Total critical care time: 30 minutes  Critical care time was exclusive of separately billable procedures and treating other patients.  Critical care was necessary to treat or prevent imminent or life-threatening deterioration.  Critical care was time spent personally by me on the following activities: development of treatment plan with patient and/or surrogate as well as nursing, discussions with consultants, evaluation of patient's response to treatment, examination of patient, obtaining history from patient or surrogate, ordering and performing treatments and interventions, ordering and review of laboratory studies, ordering and review of radiographic studies, pulse oximetry and re-evaluation of patient's condition.   Final Clinical Impressions(s) / ED Diagnoses   Final diagnoses:  NSTEMI (non-ST elevated myocardial infarction) [facility])    ED Discharge Orders    None       Orpah Greek, MD 01/11/18 [phone]

## 2018-01-11 NOTE — Consult Note (Addendum)
Cardiology Consultation:   Patient ID: Jenna Brown; [id number]; 07-20-1951   Admit date: 01/11/2018 Date of Consult: 01/11/2018  Primary Care Provider: [name], MD Primary Cardiologist: Dr Filbert Schilder, NP 04/19/2017 Primary Electrophysiologist:  n/a   Patient Profile:   Jenna Brown is a 67 y.o. female with a hx of DM2, HTN, HLD, CVA, R-AKA, known severe CAD rx medically due to poor renal function (not CABG candidate), who is being seen today for the evaluation of chest pain at the request of Dr Lorin Mercy.  History of Present Illness:   [name] was in her usual state of health until Sunday.  Sunday, she had no lower extremity edema and was breathing fine.  After church, she had an episode of chest pain that resolved with rest and sublingual nitroglycerin.  It lasted about 30 minutes.  She did not think much about it, because she normally gets chest pain once or twice a week.  However, on Wednesday, she got another episode of chest pain that started late in the evening when she was getting ready to go to bed.  There was no particular exertion involved.  This episode was more severe, reaching a 9/10.  She would take nitroglycerin and it would resolve temporarily, but come back in about 20 minutes.  Position change was no help.  The chest pain was associated with nausea and vomiting as well as shortness of breath but no diaphoresis.  When her symptoms did not resolve, she came to the emergency room about 4 in the morning.  EMS gave her 4 - 81 mg aspirin.  She also got sublingual nitroglycerin.  Her symptoms improved.  Her cardiac enzymes were elevated indicating a non-STEMI.  She is currently pain-free.  She is on IV nitro and heparin.  She has also gotten morphine for pain and Zofran for nausea.  She is currently resting comfortably.  Past Medical History:  Diagnosis Date  . Anemia   . Anxiety    takes Xanax prn  . Arthritis   . CKD (chronic kidney disease), stage IV  ([place])   . Coronary artery disease 2016   cath w/ 90% LAD, 95%CFX, 80% OM 2, 60% RCA, not CABG candidate, rx medically  . GERD (gastroesophageal reflux disease)   . H/O hiatal hernia   . Headache(784.0)    when b/p is elevated  . Hemorrhoids   . Hx of AKA (above knee amputation), right ([place])   . Hyperlipidemia    takes Simvastatin daily  . Hypertension    takes Amlodipine/HCTZ/Losartan daily  . Migraines   . Myocardial infarction ([place]) 1990's  . Peripheral vascular disease ([place])   . PONV (postoperative nausea and vomiting)   . Stroke [facility])    TIA history  . Type II diabetes mellitus (HCC)    on Lantus  . Vertigo    takes Ativert prn    Past Surgical History:  Procedure Laterality Date  . ABDOMINAL AORTAGRAM N/A 02/29/2012   Procedure: ABDOMINAL Maxcine Ham;  Surgeon: [name], MD;  Location: [facility] CATH LAB;  Service: Cardiovascular;  Laterality: N/A;  . AMPUTATION  05/10/2012   Procedure: AMPUTATION ABOVE KNEE;  Surgeon: [name], MD;  Location: [place] OR;  Service: Vascular;  Laterality: Right;   open right groin wound noted  . aortogram  02/2012  . CARDIAC CATHETERIZATION N/A 04/13/2015   Procedure: Right/Left Heart Cath and Coronary Angiography;  Surgeon: [name], MD;  Location: [place] CV LAB;  Service:  Cardiovascular;  Laterality: N/A;  . CLEFT PALATE REPAIR     several  . COLONOSCOPY    . DILATION AND CURETTAGE OF UTERUS    . FEMORAL-POPLITEAL BYPASS GRAFT  04/05/2012   Procedure: BYPASS GRAFT FEMORAL-POPLITEAL ARTERY;  Surgeon: [name], MD;  Location: [place];  Service: Vascular;  Laterality: Right;  REVISION  . FEMORAL-TIBIAL BYPASS GRAFT  03/29/2012   Procedure: BYPASS GRAFT FEMORAL-TIBIAL ARTERY;  Surgeon: [name], MD;  Location: [place] OR;  Service: Vascular;  Laterality: Right;  Right femoral to Posterior Tibialis with composite graft of 16mm x 80 cm ringed gortex graft and saphenous vein  ,intraoperative arteriogram  . FEMOROPOPLITEAL  THROMBECTOMY / EMBOLECTOMY  05/09/2012  . MYOMECTOMY    . PR VEIN BYPASS GRAFT,AORTO-FEM-POP  05/27/11   Right SFA-Below knee Pop BP  . TEE WITHOUT CARDIOVERSION N/A 01/03/2017   Procedure: TRANSESOPHAGEAL ECHOCARDIOGRAM (TEE);  Surgeon: Skeet Latch, MD;  Location[facility] ENDOSCOPY;  Service: Cardiovascular;  Laterality: N/A;  . TONSILLECTOMY    . TUBAL LIGATION  ~ 1990     Prior to Admission medications   Medication Sig Start Date End Date Taking? Authorizing Provider  acetaminophen (TYLENOL) 650 MG CR tablet Take 650 mg by mouth every 8 (eight) hours as needed for pain.     [provider]  ALPRAZolam Duanne Moron) 0.25 MG tablet Take 1 tablet (0.25 mg total) by mouth 3 (three) times daily as needed for anxiety. 01/03/17   Delos Haring, PA-C  aspirin 81 MG chewable tablet Chew 81 mg by mouth daily.    [provider]  carvedilol (COREG) 12.5 MG tablet Take 1 tablet (12.5 mg total) by mouth 2 (two) times daily with a meal. 01/03/17   Delos Haring, PA-C  cholecalciferol (VITAMIN D) 1000 units tablet Take 1,000 Units by mouth daily.    [provider]  furosemide (LASIX) 40 MG tablet Take 1 tablet (40 mg total) by mouth daily. 05/14/15   Clegg, Amy D, NP  hydrALAZINE (APRESOLINE) 50 MG tablet Take 1 tablet (50 mg total) by mouth 3 (three) times daily. 11/22/17   Bensimhon, Shaune Pascal, MD  insulin aspart (NOVOLOG) 100 UNIT/ML injection Inject 5 Units into the skin 3 (three) times daily before meals. 01/03/17   Delos Haring, PA-C  insulin glargine (LANTUS) 100 UNIT/ML injection Inject 0.25 mLs (25 Units total) into the skin 2 (two) times daily. 01/03/17   Delos Haring, PA-C  Insulin Pen Needle (AURORA PEN NEEDLES) 29G X 12MM MISC 5 Units by Does not apply route 3 (three) times daily before meals. 05/16/15   Ricard Dillon, MD  isosorbide mononitrate (IMDUR) 30 MG 24 hr tablet Take 2 tabs in AM and 1 tab in PM 04/03/17   Larey Dresser, MD  losartan (COZAAR) 50 MG tablet  Take 1 tablet (50 mg total) by mouth daily. 03/30/17   Cheryln Manly, NP  nitroGLYCERIN (NITROSTAT) 0.4 MG SL tablet Place 1 tablet (0.4 mg total) under the tongue every 5 (five) minutes x 3 doses as needed for chest pain. Patient not taking: Reported on 04/19/2017 08/13/15   Theora Gianotti, NP  pravastatin (PRAVACHOL) 40 MG tablet Take 40 mg by mouth daily.    [provider]  ticagrelor (BRILINTA) 90 MG TABS tablet Take 1 tablet (90 mg total) by mouth 2 (two) times daily. 01/03/17   Delos Haring, PA-C    Inpatient Medications: Scheduled Meds: . [START ON 01/12/2018] aspirin  81 mg Oral Daily  .  atorvastatin  80 mg Oral q1800  . carvedilol  12.5 mg Oral BID WC  . furosemide  40 mg Oral Daily  . insulin aspart  0-15 Units Subcutaneous TID WC  . insulin aspart  0-5 Units Subcutaneous QHS  . insulin glargine  25 Units Subcutaneous BID  . sodium chloride flush  3 mL Intravenous Q12H                                                                                                                                                                                                  Continuous Infusions: . sodium chloride    . heparin 900 Units/hr (01/11/18 1424)  . nitroGLYCERIN 25 mcg/min (01/11/18 1431)   PRN Meds: sodium chloride, ALPRAZolam, calcium carbonate (dosed in mg elemental calcium), camphor-menthol **AND** hydrOXYzine, docusate sodium, feeding supplement (NEPRO CARB STEADY), hydrALAZINE, morphine injection, ondansetron **OR** ondansetron (ZOFRAN) IV, sodium chloride flush, sorbitol, zolpidem  Allergies:    Allergies  Allergen Reactions  . Plavix [Clopidogrel Bisulfate] Itching  . Codeine Other (See Comments)    "makes me feel strange"  . Tylenol With Codeine #3 [Acetaminophen-Codeine] Other (See Comments)    "doesn't make me feel right"  . Tylenol [Acetaminophen] Other (See Comments)    "Doesn't feel right"     Social History:   Social History    Socioeconomic History  . Marital status: Single    Spouse name: Not on file  . Number of children: Not on file  . Years of education: Not on file  . Highest education level: Not on file  Occupational History  . Occupation: disabled  Social Needs  . Financial resource strain: Not on file  . Food insecurity:    Worry: Not on file    Inability: Not on file  . Transportation needs:    Medical: Not on file    Non-medical: Not on file  Tobacco Use  . Smoking status: Former Smoker    Packs/day: 0.25    Years: 40.00    Pack years: 10.00    Types: Cigarettes    Last attempt to quit: 02/27/2012    Years since quitting: 5.8  . Smokeless tobacco: Never Used  Substance and Sexual Activity  . Alcohol use: No  . Drug use: No  . Sexual activity: Never    Birth control/protection: Post-menopausal  Lifestyle  . Physical activity:    Days per week: Not on file    Minutes per session: Not on file  . Stress: Not on file  Relationships  . Social connections:    Talks on phone: Not on file  Gets together: Not on file    Attends religious service: Not on file    Active member of club or organization: Not on file    Attends meetings of clubs or organizations: Not on file    Relationship status: Not on file  . Intimate partner violence:    Fear of current or ex partner: Not on file    Emotionally abused: Not on file    Physically abused: Not on file    Forced sexual activity: Not on file  Other Topics Concern  . Not on file  Social History Narrative   She lives in [place] with family.    Family History:   Family History  Problem Relation Age of Onset  . Cancer Mother        breast  . Diabetes Father    Family Status:  Family Status  Relation Name Status  . Mother  Deceased at age 37       Breast cancer  . Father  Deceased at age 44       Diabetes    ROS:  Please see the history of present illness.  All other ROS reviewed and negative.     Physical Exam/Data:    Vitals:   01/11/18 1330 01/11/18 1345 01/11/18 1400 01/11/18 1426  BP: (!) 152/68 (!) 141/65  (!) 158/79  Pulse: 94 91 98 99  Resp: 17 19 16  (!) 21  Temp:      TempSrc:      SpO2: 98% 97% 99% 98%    Intake/Output Summary (Last 24 hours) at 01/11/2018 1459 Last data filed at 01/11/2018 1425 Gross per 24 hour  Intake 14.98 ml  Output 1000 ml  Net -985.02 ml   There were no vitals filed for this visit. There is no height or weight on file to calculate BMI.  General:  Well nourished, well developed, in no acute distress HEENT: normal for age Lymph: no adenopathy Neck: no JVD Endocrine:  No thryomegaly Vascular: No carotid bruits; upper extremity pulses 2+, without bruits, s/p R- AKA, LLE with edema so pulse hard to detect, capillary refill normal Cardiac:  normal S1, S2; RRR; short systolic murmur  Lungs:  clear to auscultation bilaterally, no wheezing, rhonchi or rales  Abd: soft, nontender, no hepatomegaly  Ext: no edema Musculoskeletal:  No deformities, BUE and LLE strength normal and equal Skin: warm and dry  Neuro:  CNs 2-12 intact, no focal abnormalities noted Psych:  Normal affect   EKG:  The EKG was personally reviewed and demonstrates:  ST, HR 119, lateral T wave changes, deeper than previous, J point elevation V 1 Telemetry:  Telemetry was personally reviewed and demonstrates:  SR  Relevant CV Studies:  ECHO: 12/27/2016 - Left ventricle: The cavity size was normal. Wall thickness was   increased in a pattern of mild LVH. Systolic function was mildly   reduced. The estimated ejection fraction was in the range of 45%   to 50%. Diffuse hypokinesis. Doppler parameters are consistent   with abnormal left ventricular relaxation (grade 1 diastolic   dysfunction). Doppler parameters are consistent with high   ventricular filling pressure. - Mitral valve: Calcified annulus. Impressions: - Mild global reduction in LV systolic function; grade 1 diastolic    dysfunction; left atrial density may be left atrial wall but   cannot exclude mass; suggest TEE to further assess.  CATH:  RHC/LHC 04/14/15  Prox LAD to Mid LAD lesion, 90% stenosed. Diffuse disease  Ost Cx to  Mid Cx lesion, 95% stenosed.  Ost 2nd Mrg to 2nd Mrg lesion, 80% stenosed.  Mid Cx lesion, 90% stenosed.  Prox RCA lesion, 60% stenosed.  Mid RCA lesion, 50% stenosed. R heart cath Hemodynamic findings consistent with severe pulmonary hypertension. Right atrial pressure: 16/17, mean equals 14 Right ventricular pressure: 53/11 Pulmonary artery pressure: 49/27, mean equals 35 pulmonary capillary wedge pressure: A-wave equals 31 V-wave equals 32 mean equals 29 Cardiac output by Fick was 3 L/m Diagnostic Diagram         Laboratory Data:  Chemistry Recent Labs  Lab 01/11/18 0351  NA 140  K 5.1  CL 109  CO2 19*  GLUCOSE 257*  BUN 46*  CREATININE 3.67*  CALCIUM 9.3  GFRNONAA 12*  GFRAA 14*  ANIONGAP 12    Lab Results  Component Value Date   ALT 16 01/11/2018   AST 23 01/11/2018   ALKPHOS 109 01/11/2018   BILITOT 0.7 01/11/2018   Hematology Recent Labs  Lab 01/11/18 0351  WBC 11.0*  RBC 4.12  HGB 10.8*  HCT 35.2*  MCV 85.4  MCH 26.2  MCHC 30.7  RDW 13.1  PLT 329   Cardiac Enzymes Recent Labs  Lab 01/11/18 1014  TROPONINI 6.81*    Recent Labs  Lab 01/11/18 0440  TROPIPOC 0.22*    BNP Recent Labs  Lab 01/11/18 0357  BNP 61.2    TSH:  Lab Results  Component Value Date   TSH 1.778 01/11/2018   Lipids: Lab Results  Component Value Date   CHOL 262 (H) 12/25/2016   HDL 41 12/25/2016   LDLCALC 197 (H) 12/25/2016   TRIG 118 12/25/2016   CHOLHDL 6.4 12/25/2016   HgbA1c: Lab Results  Component Value Date   HGBA1C 6.8 (H) 01/11/2018   Magnesium:  Magnesium  Date Value Ref Range Status  04/22/2015 2.0 1.7 - 2.4 mg/dL Final     Radiology/Studies:  Dg Chest 2 View  Result Date: 01/11/2018 CLINICAL DATA:  Chest pain,  nausea, hypertensive. EXAM: CHEST - 2 VIEW COMPARISON:  Chest radiograph March 23, 2017 FINDINGS: Cardiomediastinal silhouette is unremarkable for this low inspiratory examination with crowded vasculature markings. The lungs are clear without pleural effusions or focal consolidations. Persistently elevated RIGHT hemidiaphragm. Trachea projects midline and there is no pneumothorax. Included soft tissue planes and osseous structures are non-suspicious. IMPRESSION: No active cardiopulmonary disease. Electronically Signed   By: Elon Alas M.D.   On: 01/11/2018 04:56    Assessment and Plan:   Principal Problem: 1.  NSTEMI (non-ST elevated myocardial infarction) ([place]) -Patient pain-free on IV nitro and heparin -Continue home dose of Coreg 12.5 mg twice daily and change Pravachol 40 mg daily to Lipitor 80 mg a day -Risk of cath is increased because of poor renal function.  This is the reason she has been treated medically in the past. -MD review and advise if this is still the best plan or if we need to reconsider PCI -Check echo, EF has been normal in the past  2.  Chronic combined systolic and diastolic CHF: -She has a history of a mildly decreased EF with grade 1 diastolic dysfunction -No volume overload on exam.  Otherwise, per IM Active Problems:   Essential hypertension   HLD (hyperlipidemia)   Chronic systolic heart failure (HCC)   CAD in native artery   Chronic kidney disease (CKD), stage IV (severe) ([place])   Insulin-requiring or dependent type II diabetes mellitus ([place])     For questions or updates,  please contact [place] Please consult [email or web address] for contact info under Cardiology/STEMI.   Signed, Rosaria Ferries, PA-C  01/11/2018 2:59 PM    Patient seen and examined. Agree with assessment and plan.  Patient seen in the emergency room after discussion with Alessandra Grout.  Ms. Camauri Fleece is a very pleasant 67 year old African-American female who has a  long-standing history of diabetes mellitus, hypertension, hyperlipidemia, remote CVA, and PVD status post right AKA.  She is previously been documented to have severe multivessel CAD and had been turned down for CABG revascularization surgery.  She has been treated medically.  She presented to the emergency room today after experiencing several days of recurrent chest pain.  Her chest pain initially recurred after church on Easter.  She developed recurrent chest pain yesterday that was more severe.  She ultimately presented to the emergency room.  Her chest pain was associated with transient nausea and vomiting and shortness of breath.  Cardiac enzymes are positive for non-ST segment elevation myocardial infarction.  Her initial ECG had shown sinus tachycardia 119 bpm with inferolateral ST-T changes.  Currently she is pain-free in the emergency room on IV heparin and IV nitroglycerin.  Her blood pressure is 150/80 and pulse in the 90s.  There is arcus senilis.  Sclerae anicteric.  Mallampati scale is a 3.  JVD approximated 7 8 cm.  Rhythm is regular with 1/6 systolic murmur.  Her lungs are clear.  Abdomen is protuberant with positive bowel sounds.  She status post right AKA.  There is no left leg edema.  Cranial nerves are grossly intact.  Troponin is 6.81 serum creatinine is 3.67.  At present, will continue aggressive conservative management with IV nitroglycerin IV heparin and increase medical therapy.  Cardiac catheterization will undoubtedly exacerbate her chronic kidney disease disease potentially lead to need for dialysis, and will try to avoid it unless she becomes progressively symptomatic.  And to change pravastatin to atorvastatin particularly with significant LDL elevation last year.  Recheck lipid panel.  Will add dual antiplatelet therapy since she is not felt to be a CABG candidate.  We will monitor closely.   Troy Sine, MD, [facility] 01/11/2018 5:00 pm

## 2018-01-11 NOTE — ED Notes (Signed)
Heart Healthy/Carb Modified diet lunch tray ordered.

## 2018-01-11 NOTE — ED Triage Notes (Signed)
Pt BIB EMS from home. Reports onset of CP around 2300 that has progressively gotten worse. Per EMS, pain 8/10 with accompanying nausea and single episode of vomiting. Pain resolved en route, but pt notes she "feels it coming back."

## 2018-01-11 NOTE — ED Notes (Signed)
Spoke with pharmacy again in regards to Lantus, they will resend.

## 2018-01-11 NOTE — Progress Notes (Signed)
ANTICOAGULATION CONSULT NOTE - Initial Consult  Pharmacy Consult for Heparin  Indication: chest pain/ACS  Allergies  Allergen Reactions  . Plavix [Clopidogrel Bisulfate] Itching  . Codeine Other (See Comments)    "makes me feel strange"  . Tylenol With Codeine #3 [Acetaminophen-Codeine] Other (See Comments)    "doesn't make me feel right"  . Tylenol [Acetaminophen] Other (See Comments)    "Doesn't feel right"     Vital Signs: Temp: 98.5 F (36.9 C) (04/25 0400) Temp Source: Oral (04/25 0400) BP: 158/79 (04/25 1426) Pulse Rate: 99 (04/25 1426)  Labs: Recent Labs    01/11/18 0351 01/11/18 1014 01/11/18 1403  HGB 10.8*  --   --   HCT 35.2*  --   --   PLT 329  --   --   HEPARINUNFRC  --   --  0.57  CREATININE 3.67*  --   --   TROPONINI  --  6.81*  --     CrCl cannot be calculated (Unknown ideal weight.).   Medical History: Past Medical History:  Diagnosis Date  . Anemia   . Anxiety    takes Xanax prn  . Arthritis   . CKD (chronic kidney disease), stage IV ([place])   . Coronary artery disease   . GERD (gastroesophageal reflux disease)   . H/O hiatal hernia   . Headache(784.0)    when b/p is elevated  . Hemorrhoids   . Hx of AKA (above knee amputation), right ([place])   . Hyperlipidemia    takes Simvastatin daily  . Hypertension    takes Amlodipine/HCTZ/Losartan daily  . Migraines   . Myocardial infarction ([place]) 1990's  . Peripheral vascular disease ([place])   . PONV (postoperative nausea and vomiting)   . Stroke [facility])    TIA history  . Type II diabetes mellitus (HCC)    takes Glucovance and Januvia daily  . Vertigo    takes Ativert prn    Assessment: [name] to start heparin for CP/mildly elevated troponin. PTA meds reviewed. Hgb 10.8. Plts Ok. Heparin level therapeutic. No bleed documented.  Goal of Therapy:  Heparin level 0.3-0.7 units/ml Monitor platelets by anticoagulation protocol: Yes   Plan:  Continue heparin drip at 900 units/hr Monitor daily  heparin level and CBC, s/sx bleeding  Elicia Lamp, PharmD, BCPS Clinical Pharmacist 01/11/2018 2:37 PM

## 2018-01-11 NOTE — Progress Notes (Signed)
Inpatient Diabetes Program Recommendations  AACE/ADA: New Consensus Statement on Inpatient Glycemic Control (2015)  Target Ranges:  Prepandial:   less than 140 mg/dL      Peak postprandial:   less than 180 mg/dL (1-2 hours)      Critically ill patients:  140 - 180 mg/dL   Lab Results  Component Value Date   GLUCAP 147 (H) 03/30/2017   HGBA1C 8.2 (H) 12/25/2016   Review of Glycemic Control  Diabetes history: DM 2 Outpatient Diabetes medications: Lantus 25 units BID, Novolog 5 units tid Current orders for Inpatient glycemic control: None to be admitted in ED still  A1c 8.2% on 12/25/17  Inpatient Diabetes Program Recommendations:    Elevated renal function. Consider a portion of home basal insulin, Lantus 15 units BID, Novolog Sensitive Correction 0-9 units tid + Novolog HS scale 0-5 units qhs.  Thanks,  Tama Headings RN, MSN, BC-ADM, [facility] Inpatient Diabetes Coordinator Team Pager [phone] (8a-5p)

## 2018-01-11 NOTE — H&P (Signed)
History and Physical    Jenna Brown:[id number] DOB: April 19, 1951 DOA: 01/11/2018  PCP: [name], MD Consultants:  Cardiology; Nephrology in [facility] Patient coming from:  Home - lives with mother, sister; [place]: Tora Duck, [phone]  Chief Complaint: chest pain  HPI: Jenna Brown is a 67 y.o. female with medical history significant of DM; TIA; PVD; CAD with multivessel disease but not an intervention candidate due to stage IV CKD; HTN; R AKA; and HLD presenting with chest pain.  Patient had "an attack last night".  She developed chest pain, n/v.  Substernal pain while she was trying sleep, had to move around and wiggle because she couldn't get comfortable.  She was fine yesterday, went to her daughter's house and visited.  Her pain is much better now with the NTG drip.  She reports that cardiology has described her condition as a "spasm."   ED Course:  Carryover, per EDP note: Patient presents to the ER from home for evaluation of chest pain. Patient has a history of severe coronary artery disease. Reviewing her records reveals multiple and NSTEMI's. She has three-vessel disease documented in 2016 by cardiac catheterization, however, she has not had any further catheterizations because of stage IV kidney disease. She has been medical management to this point.  Patient does have downsloping ST depressions inferiorly, likely ischemia. She has a mildly elevated troponin. Discussed with Dr. Teena Dunk, on-call for cardiology. Recommends medical management, agrees with heparin and nitroglycerin, hydralazine as needed for her significant blood pressure elevation. As she is not a candidate for intervention (neither PCI nor CABG) recommends medicine admission.   Review of Systems: As per HPI; otherwise review of systems reviewed and negative.   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 Anemia   . Anxiety    takes Xanax prn  . Arthritis   . CKD (chronic kidney disease), stage IV ([place])   . Coronary artery disease   . GERD (gastroesophageal reflux disease)   . H/O hiatal hernia   . Headache(784.0)    when b/p is elevated  . Hemorrhoids   . Hx of BKA ([place])   . Hyperlipidemia    takes Simvastatin daily  . Hypertension    takes Amlodipine/HCTZ/Losartan daily  . Migraines   . Myocardial infarction ([place]) 1990's  . Peripheral vascular disease ([place])   . PONV (postoperative nausea and vomiting)   . Stroke [facility])    TIA history  . Type II diabetes mellitus (HCC)    takes Glucovance and Januvia daily  . Vertigo    takes Ativert prn    Past Surgical History:  Procedure Laterality Date  . ABDOMINAL AORTAGRAM N/A 02/29/2012   Procedure: ABDOMINAL Maxcine Ham;  Surgeon: [name], MD;  Location: [facility] CATH LAB;  Service: Cardiovascular;  Laterality: N/A;  . AMPUTATION  05/10/2012   Procedure: AMPUTATION ABOVE KNEE;  Surgeon: [name], MD;  Location: [place] OR;  Service: Vascular;  Laterality: Right;   open right groin wound noted  . aortogram  02/2012  . CARDIAC CATHETERIZATION N/A 04/13/2015   Procedure: Right/Left Heart Cath and Coronary Angiography;  Surgeon: [name], MD;  Location: [place] CV LAB;  Service: Cardiovascular;  Laterality: N/A;  . CLEFT PALATE REPAIR     several  . COLONOSCOPY    . DILATION AND CURETTAGE OF  UTERUS    . FEMORAL-POPLITEAL BYPASS GRAFT  04/05/2012   Procedure: BYPASS GRAFT FEMORAL-POPLITEAL ARTERY;  Surgeon: [name], MD;  Location: [place];  Service: Vascular;  Laterality: Right;  REVISION  . FEMORAL-TIBIAL BYPASS GRAFT  03/29/2012   Procedure: BYPASS GRAFT FEMORAL-TIBIAL ARTERY;  Surgeon: [name], MD;  Location: [place] OR;  Service: Vascular;  Laterality: Right;  Right femoral to Posterior Tibialis with composite graft of 28mm x 80 cm ringed gortex graft and saphenous vein  ,intraoperative  arteriogram  . FEMOROPOPLITEAL THROMBECTOMY / EMBOLECTOMY  05/09/2012  . MYOMECTOMY    . PR VEIN BYPASS GRAFT,AORTO-FEM-POP  05/27/11   Right SFA-Below knee Pop BP  . TEE WITHOUT CARDIOVERSION N/A 01/03/2017   Procedure: TRANSESOPHAGEAL ECHOCARDIOGRAM (TEE);  Surgeon: Skeet Latch, MD;  Location: [facility] ENDOSCOPY;  Service: Cardiovascular;  Laterality: N/A;  . TONSILLECTOMY    . TUBAL LIGATION  ~ 1990    Social History   Socioeconomic History  . Marital status: Single    Spouse name: Not on file  . Number of children: Not on file  . Years of education: Not on file  . Highest education level: Not on file  Occupational History  . Occupation: disabled  Social Needs  . Financial resource strain: Not on file  . Food insecurity:    Worry: Not on file    Inability: Not on file  . Transportation needs:    Medical: Not on file    Non-medical: Not on file  Tobacco Use  . Smoking status: Former Smoker    Packs/day: 0.25    Years: 40.00    Pack years: 10.00    Types: Cigarettes    Last attempt to quit: 02/27/2012    Years since quitting: 5.8  . Smokeless tobacco: Never Used  Substance and Sexual Activity  . Alcohol use: No  . Drug use: No  . Sexual activity: Never    Birth control/protection: Post-menopausal  Lifestyle  . Physical activity:    Days per week: Not on file    Minutes per session: Not on file  . Stress: Not on file  Relationships  . Social connections:    Talks on phone: Not on file    Gets together: Not on file    Attends religious service: Not on file    Active member of club or organization: Not on file    Attends meetings of clubs or organizations: Not on file    Relationship status: Not on file  . Intimate partner violence:    Fear of current or ex partner: Not on file    Emotionally abused: Not on file    Physically abused: Not on file    Forced sexual activity: Not on file  Other Topics Concern  . Not on file  Social History Narrative  . Not on file      Allergies  Allergen Reactions  . Plavix [Clopidogrel Bisulfate] Itching  . Codeine Other (See Comments)    "makes me feel strange"  . Tylenol With Codeine #3 [Acetaminophen-Codeine] Other (See Comments)    "doesn't make me feel right"  . Tylenol [Acetaminophen] Other (See Comments)    "Doesn't feel right"     Family History  Problem Relation Age of Onset  . Cancer Mother        breast  . Diabetes Father     Prior to Admission medications   Medication Sig Start Date End Date Taking? Authorizing Provider  acetaminophen (TYLENOL) 650 MG CR  tablet Take 650 mg by mouth every 8 (eight) hours as needed for pain.     [provider]  ALPRAZolam Duanne Moron) 0.25 MG tablet Take 1 tablet (0.25 mg total) by mouth 3 (three) times daily as needed for anxiety. 01/03/17   Delos Haring, PA-C  aspirin 81 MG chewable tablet Chew 81 mg by mouth daily.    [provider]  carvedilol (COREG) 12.5 MG tablet Take 1 tablet (12.5 mg total) by mouth 2 (two) times daily with a meal. 01/03/17   Delos Haring, PA-C  cholecalciferol (VITAMIN D) 1000 units tablet Take 1,000 Units by mouth daily.    [provider]  furosemide (LASIX) 40 MG tablet Take 1 tablet (40 mg total) by mouth daily. 05/14/15   Clegg, Amy D, NP  hydrALAZINE (APRESOLINE) 25 MG tablet Take 50 mg by mouth 2 (two) times daily.    [provider]  hydrALAZINE (APRESOLINE) 50 MG tablet Take 1 tablet (50 mg total) by mouth 3 (three) times daily. 11/22/17   Bensimhon, Shaune Pascal, MD  insulin aspart (NOVOLOG) 100 UNIT/ML injection Inject 5 Units into the skin 3 (three) times daily before meals. 01/03/17   Delos Haring, PA-C  insulin glargine (LANTUS) 100 UNIT/ML injection Inject 0.25 mLs (25 Units total) into the skin 2 (two) times daily. 01/03/17   Delos Haring, PA-C  Insulin Pen Needle (AURORA PEN NEEDLES) 29G X 12MM MISC 5 Units by Does not apply route 3 (three) times daily before meals. 05/16/15   Ricard Dillon, MD  isosorbide mononitrate (IMDUR) 30 MG 24 hr tablet Take 2 tabs in AM and 1 tab in PM 04/03/17   Larey Dresser, MD  isosorbide mononitrate (IMDUR) 30 MG 24 hr tablet take 3 tablets by mouth once daily 07/11/17   Bensimhon, Shaune Pascal, MD  losartan (COZAAR) 50 MG tablet Take 1 tablet (50 mg total) by mouth daily. 03/30/17   Cheryln Manly, NP  nitroGLYCERIN (NITROSTAT) 0.4 MG SL tablet Place 1 tablet (0.4 mg total) under the tongue every 5 (five) minutes x 3 doses as needed for chest pain. Patient not taking: Reported on 04/19/2017 08/13/15   Theora Gianotti, NP  pravastatin (PRAVACHOL) 40 MG tablet Take 40 mg by mouth daily.    [provider]  ticagrelor (BRILINTA) 90 MG TABS tablet Take 1 tablet (90 mg total) by mouth 2 (two) times daily. 01/03/17   Delos Haring, PA-C    Physical Exam: Vitals:   01/11/18 1230 01/11/18 1245 01/11/18 1300 01/11/18 1315  BP: (!) 145/73 (!) 148/74 (!) 156/75 (!) 145/72  Pulse: 85 89 90 93  Resp: 18 20 16 20   Temp:      TempSrc:      SpO2: 98% 98% 99% 98%     General:  Appears calm and comfortable and is NAD; she appears to be unsophisticated medically and it is not clear the extent to which she understands what is happening. Eyes:  PERRL, EOMI, normal lids, iris ENT:  grossly normal hearing, lips & tongue, mmm Neck:  no LAD, masses or thyromegaly; no carotid bruits Cardiovascular:  RRR, no m/r/g. No LE edema.  Respiratory:   CTA bilaterally with no wheezes/rales/rhonchi.  Normal respiratory effort. Abdomen:  soft, NT, ND, NABS Skin:  no rash or induration seen on limited exam Musculoskeletal:  grossly normal tone BUE/BLE (s/p well-healed R AKA), good ROM, no bony abnormality Psychiatric:  grossly normal mood and affect, speech fluent and appropriate but simple, AOx3  Neurologic:  CN 2-12 grossly intact, moves all extremities in coordinated fashion, sensation intact    Radiological Exams on Admission: Dg Chest 2  View  Result Date: 01/11/2018 CLINICAL DATA:  Chest pain, nausea, hypertensive. EXAM: CHEST - 2 VIEW COMPARISON:  Chest radiograph March 23, 2017 FINDINGS: Cardiomediastinal silhouette is unremarkable for this low inspiratory examination with crowded vasculature markings. The lungs are clear without pleural effusions or focal consolidations. Persistently elevated RIGHT hemidiaphragm. Trachea projects midline and there is no pneumothorax. Included soft tissue planes and osseous structures are non-suspicious. IMPRESSION: No active cardiopulmonary disease. Electronically Signed   By: Elon Alas M.D.   On: 01/11/2018 04:56    EKG: Independently reviewed.  Sinus tachycardia with rate 119; marked ST changes concerning for acute ischemia   Labs on Admission: I have personally reviewed the available labs and imaging studies at the time of the admission.  Pertinent labs:   CO2 19 Glucose 257 BUN 46/Creatinine 3.67/GFR 14; prior 44/2.72/20 in 8/18 BNP 61.2 Troponin 0.22 WBC 11.0 Hgb 10.8   Assessment/Plan Principal Problem:   NSTEMI (non-ST elevated myocardial infarction) ([place]) Active Problems:   Essential hypertension   HLD (hyperlipidemia)   Chronic systolic heart failure (HCC)   CAD in native artery   Chronic kidney disease (CKD), stage IV (severe) (HCC)   Insulin-requiring or dependent type II diabetes mellitus ([place])   NSTEMI -Patient with known multi-vessel CAD who has been deemed not a candidate for intervention due to severe CKD who presents with substernal chest pain that came on acutely at rest that resolved with NTG drip.  -CXR unremarkable.   -Initial troponin positive.   -EKG with marked ST changes concerning for acute ischemia. -TIMI risk score is 6; which predicts a 14 days risk of death, recurrent MI, or urgent revascularization of 40.9%.  -Will plan to admit to [facility] on telemetry.  -Patient discussed with cardiology overnight; Dr. Teena Dunk apparently recommended  medical management with heparin, NTG, and hydralazine and said cardiology would see her in the AM -Cardiology re-consulted since the patient has not yet been seen. -cycle troponin q6h  -ASA 81 mg PO daily -morphine given -Heparin drip -NTG drip -Start Lipitor 80 mg qhs for now; she takes pravastatin at home and this is likely insufficient for lipid control -Risk factor stratification with FLP and HgbA1c; will also check TSH   HTN -Continue Coreg -Hold Cozaar - likely not a good choice given her CKD -Cover with IV hydralazine rather than PO for now  HLD -Start Lipitor 80 mg qhs for now; she takes pravastatin at home and this is likely insufficient for lipid control -Check lipid panel  DM -A1c 6.8 -Continue Lantus -Cover with moderate-scale SSI  CKD -While there may be some acute component, her GFR last year ranged from 16-22 in 2018 and has not been checked since 8/18 -Suspect that this may be a small component of acute kidney injury but also likely progressive renal disease -Gentle rehydration provided -Based on prior considerations by cardiology, she is not a cath/PCI/CABG candidate due to CKD -As such, it would be hard to imagine that she would be a dialysis candidate -Palliative care consult placed - note that she reports that she was previously DNR but "a nurse told me that I should definitely not tell anyone that!"  CHF -Echo 7/18 with combined heart failure, EF 45-50% and grade 1 diastolic dysfunction -This may be worsened in the setting of acute ischemia -Will be judicious in rehydration efforts  DVT prophylaxis: Heparin Code Status:  Full - confirmed with patient Family Communication: None present Disposition Plan:  Home once clinically improved Consults called: Cardiology; palliative care  Admission status: Admit - It is my clinical opinion that admission to INPATIENT is reasonable and necessary because of the expectation that this patient will require hospital care  that crosses at least 2 midnights to treat this condition based on the medical complexity of the problems presented.  Given the aforementioned information, the predictability of an adverse outcome is felt to be significant.     Karmen Bongo MD [facility]  If note is complete, please contact covering daytime or nighttime physician. [email or web address] Password [email or web address]  01/11/2018, 1:31 PM

## 2018-01-11 NOTE — Progress Notes (Signed)
ANTICOAGULATION CONSULT NOTE - Initial Consult  Pharmacy Consult for Heparin  Indication: chest pain/ACS  Allergies  Allergen Reactions  . Plavix [Clopidogrel Bisulfate] Itching  . Codeine Other (See Comments)    "makes me feel strange"  . Tylenol With Codeine #3 [Acetaminophen-Codeine] Other (See Comments)    "doesn't make me feel right"  . Tylenol [Acetaminophen] Other (See Comments)    "Doesn't feel right"     Vital Signs: Temp: 98.5 F (36.9 C) (04/25 0400) Temp Source: Oral (04/25 0400) BP: 193/106 (04/25 0400) Pulse Rate: 114 (04/25 0400)  Labs: Recent Labs    01/11/18 0351  HGB 10.8*  HCT 35.2*  PLT 329    CrCl cannot be calculated (Patient's most recent lab result is older than the maximum 21 days allowed.).   Medical History: Past Medical History:  Diagnosis Date  . Anemia   . Anxiety    takes Xanax prn  . Arthritis   . CKD (chronic kidney disease), stage IV ([place])   . Coronary artery disease   . GERD (gastroesophageal reflux disease)   . H/O hiatal hernia   . Headache(784.0)    when b/p is elevated  . Hemorrhoids   . Hyperlipidemia    takes Simvastatin daily  . Hypertension    takes Amlodipine/HCTZ/Losartan daily  . Migraines   . Myocardial infarction ([place]) 1990's  . Peripheral vascular disease ([place])   . PONV (postoperative nausea and vomiting)   . Stroke [facility])    TIA history  . Type II diabetes mellitus (HCC)    takes Glucovance and Januvia daily  . Vertigo    takes Ativert prn    Assessment: [name] to start heparin for CP/midly elevated troponin. PTA meds reviewed. Hgb 10.8. Plts Ok.   Goal of Therapy:  Heparin level 0.3-0.7 units/ml Monitor platelets by anticoagulation protocol: Yes   Plan:  Heparin 3000 units BOLUS Start heparin drip at 900 units/hr 1400 HL Daily CBC/HL Monitor for bleeding   Harold, Mattes 01/11/2018,5:03 AM

## 2018-01-12 ENCOUNTER — Inpatient Hospital Stay (HOSPITAL_COMMUNITY): Payer: Medicare Other

## 2018-01-12 DIAGNOSIS — Z515 Encounter for palliative care: Secondary | ICD-10-CM

## 2018-01-12 DIAGNOSIS — N186 End stage renal disease: Secondary | ICD-10-CM

## 2018-01-12 DIAGNOSIS — Z7189 Other specified counseling: Secondary | ICD-10-CM

## 2018-01-12 DIAGNOSIS — Z992 Dependence on renal dialysis: Secondary | ICD-10-CM

## 2018-01-12 DIAGNOSIS — N185 Chronic kidney disease, stage 5: Secondary | ICD-10-CM

## 2018-01-12 LAB — HIV ANTIBODY (ROUTINE TESTING W REFLEX): HIV SCREEN 4TH GENERATION: NONREACTIVE

## 2018-01-12 LAB — BASIC METABOLIC PANEL
ANION GAP: 8 (ref 5–15)
BUN: 49 mg/dL — ABNORMAL HIGH (ref 6–20)
CO2: 21 mmol/L — ABNORMAL LOW (ref 22–32)
Calcium: 8.4 mg/dL — ABNORMAL LOW (ref 8.9–10.3)
Chloride: 109 mmol/L (ref 101–111)
Creatinine, Ser: 3.87 mg/dL — ABNORMAL HIGH (ref 0.44–1.00)
GFR calc Af Amer: 13 mL/min — ABNORMAL LOW (ref >60)
GFR calc non Af Amer: 11 mL/min — ABNORMAL LOW (ref >60)
Glucose, Bld: 159 mg/dL — ABNORMAL HIGH (ref 65–99)
POTASSIUM: 5.6 mmol/L — AB (ref 3.5–5.1)
SODIUM: 138 mmol/L (ref 135–145)

## 2018-01-12 LAB — CBC
HCT: 26.9 % — ABNORMAL LOW (ref 36.0–46.0)
Hemoglobin: 8.2 g/dL — ABNORMAL LOW (ref 12.0–15.0)
MCH: 26.1 pg (ref 26.0–34.0)
MCHC: 30.5 g/dL (ref 30.0–36.0)
MCV: 85.7 fL (ref 78.0–100.0)
Platelets: 335 10*3/uL (ref 150–400)
RBC: 3.14 MIL/uL — ABNORMAL LOW (ref 3.87–5.11)
RDW: 13 % (ref 11.5–15.5)
WBC: 10.2 10*3/uL (ref 4.0–10.5)

## 2018-01-12 LAB — GLUCOSE, CAPILLARY
GLUCOSE-CAPILLARY: 168 mg/dL — AB (ref 65–99)
GLUCOSE-CAPILLARY: 112 mg/dL — AB (ref 65–99)
Glucose-Capillary: 114 mg/dL — ABNORMAL HIGH (ref 65–99)
Glucose-Capillary: 122 mg/dL — ABNORMAL HIGH (ref 65–99)

## 2018-01-12 LAB — LIPID PANEL
CHOL/HDL RATIO: 4.2 ratio
CHOLESTEROL: 179 mg/dL (ref 0–200)
HDL: 43 mg/dL (ref >40)
LDL Cholesterol: 118 mg/dL — ABNORMAL HIGH (ref 0–99)
TRIGLYCERIDES: 89 mg/dL (ref <150)
VLDL: 18 mg/dL (ref 0–40)

## 2018-01-12 LAB — HEPARIN LEVEL (UNFRACTIONATED): HEPARIN UNFRACTIONATED: 0.52 [IU]/mL (ref 0.30–0.70)

## 2018-01-12 MED ORDER — AMLODIPINE BESYLATE 5 MG PO TABS
5.0000 mg | ORAL_TABLET | Freq: Every day | ORAL | Status: DC
  Administered 2018-01-13 – 2018-01-15 (×3): 5 mg via ORAL
  Filled 2018-01-12 (×3): qty 1

## 2018-01-12 MED ORDER — CARVEDILOL 12.5 MG PO TABS
12.5000 mg | ORAL_TABLET | Freq: Two times a day (BID) | ORAL | Status: DC
  Administered 2018-01-12: 12.5 mg via ORAL
  Filled 2018-01-12: qty 1

## 2018-01-12 MED ORDER — CARVEDILOL 6.25 MG PO TABS
18.7500 mg | ORAL_TABLET | Freq: Two times a day (BID) | ORAL | Status: DC
  Administered 2018-01-13 – 2018-01-15 (×5): 18.75 mg via ORAL
  Filled 2018-01-12 (×5): qty 1

## 2018-01-12 MED ORDER — SODIUM CHLORIDE 0.9 % IV SOLN
INTRAVENOUS | Status: DC
  Administered 2018-01-12: 14:00:00 via INTRAVENOUS

## 2018-01-12 NOTE — Consult Note (Signed)
Consultation Note Date: 01/12/2018   Patient Name: Jenna Brown  DOB: August 24, 1951  MRN: [id number]  Age / Sex: 67 y.o., female  PCP: [name], MD Referring Physician: Cherene Altes, MD  Reason for Consultation: Establishing goals of care  HPI/Patient Profile: 67 y.o. female  with past medical history of anxiety, CKD4, CAD, GERD, HLD, HTN, CVA, T2DM, cardiac mass, CHF, multiple NSTEMIs, and R AKA admitted on 01/11/2018 with chest. Diagnosed with NSTEMI. D/t ESRD, patient is not a cath candidate. Also, poor HD candidate   PMT consulted for [place].  Clinical Assessment and Goals of Care: I have reviewed medical records including EPIC notes, labs and imaging, assessed the patient and then met at the bedside  to discuss diagnosis prognosis, GOC, EOL wishes, disposition and options.  Upon entering room, patient's grandchildren (about 67 years old) at bedside. Explained to patient sensitivity of topics we needed to discuss and suggested they step outside, she insisted they stay. Not an ideal situation for [place] conversation, recommend repeating conversation.  We discussed a brief life review of the patient. She tells me she lives with her mother and siblings. Her grandchildren are important to her.   As far as functional and nutritional status, she is able to independently complete ADLs, has a good appetite.    She tells me she is feeling better now that she is on the nitro drip.    We discussed their current illness and what it means in the larger context of their on-going co-morbidities. Specifically, we discussed her cardiac disease and ESRD. When I spoke of her renal disease she acted surprised and told me she did not realize that she had such poor renal function. She says "I'm still making urine". We did not discuss EOL expectations d/t children present.   Advanced directives, concepts specific to code status, artifical feeding and  hydration, and rehospitalization were considered and discussed. Patient tells me "I want to be revived" and "I'll do anything that will get me better". Again, did not discuss this graphically d/t children.   Questions and concerns were addressed.  The patient was encouraged to call with questions or concerns.   Primary Decision Maker PATIENT    SUMMARY OF RECOMMENDATIONS   Needs repeat GOC, PMT will follow up as able Full code/full scope  Code Status/Advance Care Planning:  Full code   Symptom Management:   Per primary  Palliative Prophylaxis:   Aspiration, Bowel Regimen, Delirium Protocol, Frequent Pain Assessment and Turn Reposition  Additional Recommendations (Limitations, Scope, Preferences):  Full Scope Treatment  Psycho-social/Spiritual:   Desire for further Chaplaincy support:not asked  Additional Recommendations: Education on Hospice  Prognosis:   Unable to determine poor prognosis d/t ESRD, poor HD candidate, extensive heart disease, poor PCI candidate, multiple comorbidities  Discharge Planning: To Be Determined      Primary Diagnoses: Present on Admission: . CAD in native artery . Chronic kidney disease (CKD), stage IV (severe) ([place]) . NSTEMI (non-ST elevated myocardial infarction) ([place]) . Chronic systolic heart failure ([place]) . Essential hypertension . HLD (hyperlipidemia)   I have reviewed the medical record, interviewed the patient and family, and examined the patient. The following aspects are pertinent.  Past Medical History:  Diagnosis Date  . Anemia   . Anxiety    takes Xanax prn  . Arthritis   . CKD (chronic kidney disease), stage IV ([place])   . Coronary artery disease 2016   cath w/ 90% LAD, 95%CFX, 80% OM 2, 60% RCA, not  CABG candidate, rx medically  . GERD (gastroesophageal reflux disease)   . H/O hiatal hernia   . Headache(784.0)    when b/p is elevated  . Hemorrhoids   . Hx of AKA (above knee amputation), right ([place])   .  Hyperlipidemia    takes Simvastatin daily  . Hypertension    takes Amlodipine/HCTZ/Losartan daily  . Migraines   . Myocardial infarction ([place]) 1990's  . Peripheral vascular disease ([place])   . PONV (postoperative nausea and vomiting)   . Stroke [facility])    TIA history  . Type II diabetes mellitus (HCC)    on Lantus  . Vertigo    takes Ativert prn   Social History   Socioeconomic History  . Marital status: Single    Spouse name: Not on file  . Number of children: Not on file  . Years of education: Not on file  . Highest education level: Not on file  Occupational History  . Occupation: disabled  Social Needs  . Financial resource strain: Not on file  . Food insecurity:    Worry: Not on file    Inability: Not on file  . Transportation needs:    Medical: Not on file    Non-medical: Not on file  Tobacco Use  . Smoking status: Former Smoker    Packs/day: 0.25    Years: 40.00    Pack years: 10.00    Types: Cigarettes    Last attempt to quit: 02/27/2012    Years since quitting: 5.8  . Smokeless tobacco: Never Used  Substance and Sexual Activity  . Alcohol use: No  . Drug use: No  . Sexual activity: Never    Birth control/protection: Post-menopausal  Lifestyle  . Physical activity:    Days per week: Not on file    Minutes per session: Not on file  . Stress: Not on file  Relationships  . Social connections:    Talks on phone: Not on file    Gets together: Not on file    Attends religious service: Not on file    Active member of club or organization: Not on file    Attends meetings of clubs or organizations: Not on file    Relationship status: Not on file  Other Topics Concern  . Not on file  Social History Narrative   She lives in [place] with family.   Family History  Problem Relation Age of Onset  . Cancer Mother        breast  . Diabetes Father    Scheduled Meds: . amLODipine  7.5 mg Oral Daily  . aspirin  81 mg Oral Daily  . atorvastatin  80 mg Oral  q1800  . carvedilol  18.75 mg Oral BID WC  . furosemide  40 mg Oral Daily  . insulin aspart  0-15 Units Subcutaneous TID WC  . insulin aspart  0-5 Units Subcutaneous QHS  . insulin glargine  25 Units Subcutaneous BID  . sodium chloride flush  3 mL Intravenous Q12H  . ticagrelor  90 mg Oral BID   Continuous Infusions: . sodium chloride    . heparin 900 Units/hr (01/12/18 0359)  . nitroGLYCERIN 30 mcg/min (01/11/18 1530)   PRN Meds:.sodium chloride, ALPRAZolam, calcium carbonate (dosed in mg elemental calcium), camphor-menthol **AND** hydrOXYzine, docusate sodium, feeding supplement (NEPRO CARB STEADY), hydrALAZINE, morphine injection, ondansetron **OR** ondansetron (ZOFRAN) IV, sodium chloride flush, sorbitol, zolpidem Allergies  Allergen Reactions  . Plavix [Clopidogrel Bisulfate] Itching  . Codeine Other (See Comments)    "  makes me feel strange"  . Tylenol With Codeine #3 [Acetaminophen-Codeine] Other (See Comments)    "doesn't make me feel right"   Review of Systems  All other systems reviewed and are negative.   Physical Exam  Constitutional: She is oriented to person, place, and time. She appears well-developed and well-nourished. No distress.  HENT:  Head: Normocephalic and atraumatic.  Cardiovascular: Normal rate and regular rhythm.  Pulmonary/Chest: Effort normal. No accessory muscle usage. No respiratory distress.  Abdominal: Soft.  Musculoskeletal:  R AKA  Neurological: She is alert and oriented to person, place, and time.  Skin: Skin is warm and dry.  Psychiatric: She has a normal mood and affect. Her behavior is normal.    Vital Signs: BP 122/71 (BP Location: Right Arm)   Pulse 83   Temp 97.9 F (36.6 C) (Oral)   Resp 11   Wt 72.6 kg (160 lb 0.9 oz)   SpO2 96%   BMI 27.47 kg/m  Pain Scale: 0-10   Pain Score: 0-No pain   SpO2: SpO2: 96 % O2 Device:SpO2: 96 % O2 Flow Rate: .   IO: Intake/output summary:   Intake/Output Summary (Last 24 hours) at  01/12/2018 1002 Last data filed at 01/12/2018 0901 Gross per 24 hour  Intake 480 ml  Output 1250 ml  Net -770 ml    LBM: Last BM Date: 01/10/18 Baseline Weight: Weight: 72.6 kg (160 lb 0.9 oz) Most recent weight: Weight: 72.6 kg (160 lb 0.9 oz)     Palliative Assessment/Data: PPS 70%     Time In: 1500 Time Out: 1530 Time Total: 30 minutes Greater than 50%  of this time was spent counseling and coordinating care related to the above assessment and plan.  Juel Burrow, DNP, AGNP-C Palliative Medicine Team [phone]

## 2018-01-12 NOTE — Progress Notes (Signed)
ANTICOAGULATION CONSULT NOTE - Initial Consult  Pharmacy Consult for Heparin  Indication: chest pain/ACS  Allergies  Allergen Reactions  . Plavix [Clopidogrel Bisulfate] Itching  . Codeine Other (See Comments)    "makes me feel strange"  . Tylenol With Codeine #3 [Acetaminophen-Codeine] Other (See Comments)    "doesn't make me feel right"    Vital Signs: Temp: 97.9 F (36.6 C) (04/26 0813) Temp Source: Oral (04/26 0813) BP: 110/55 (04/26 1049) Pulse Rate: 83 (04/26 0813)  Labs: Recent Labs    01/11/18 0351 01/11/18 1014 01/11/18 1403 01/11/18 1602 01/11/18 2155 01/12/18 0335 01/12/18 1039  HGB 10.8*  --   --   --   --  8.2*  --   HCT 35.2*  --   --   --   --  26.9*  --   PLT 329  --   --   --   --  335  --   HEPARINUNFRC  --   --  0.57  --   --   --  0.52  CREATININE 3.67*  --   --   --   --  3.87*  --   TROPONINI  --  6.81*  --  9.71* 10.54*  --   --     CrCl cannot be calculated (Unknown ideal weight.).  Assessment: [name] to start heparin for CP/mildly elevated troponin.  Heparin level therapeutic. No bleed documented.  Goal of Therapy:  Heparin level 0.3-0.7 units/ml Monitor platelets by anticoagulation protocol: Yes   Plan:  Continue heparin drip at 900 units/hr Monitor daily heparin level and CBC, s/sx bleeding  Thank you Anette Guarneri, PharmD [phone] 01/12/2018 11:36 AM

## 2018-01-12 NOTE — Progress Notes (Addendum)
Progress Note  Patient Name: Jenna Brown Date of Encounter: 01/12/2018  Primary Cardiologist: Glori Bickers, MD   Subjective   Feeling better, on IV NTG and heparin.    Inpatient Medications    Scheduled Meds: . aspirin  81 mg Oral Daily  . atorvastatin  80 mg Oral q1800  . carvedilol  12.5 mg Oral BID WC  . insulin aspart  0-15 Units Subcutaneous TID WC  . insulin aspart  0-5 Units Subcutaneous QHS  . insulin glargine  25 Units Subcutaneous BID  . ticagrelor  90 mg Oral BID   Continuous Infusions: . sodium chloride 20 mL/hr at 01/12/18 1335  . heparin 900 Units/hr (01/12/18 0359)  . nitroGLYCERIN 30 mcg/min (01/11/18 1530)   PRN Meds: ALPRAZolam, calcium carbonate (dosed in mg elemental calcium), camphor-menthol **AND** hydrOXYzine, docusate sodium, feeding supplement (NEPRO CARB STEADY), hydrALAZINE, morphine injection, ondansetron **OR** ondansetron (ZOFRAN) IV, sorbitol, zolpidem   Vital Signs    Vitals:   01/12/18 0400 01/12/18 0813 01/12/18 1049 01/12/18 1329  BP: 102/61 122/71 (!) 110/55 109/63  Pulse: 83 83  84  Resp: (!) 25 11  14   Temp: 97.9 F (36.6 C) 97.9 F (36.6 C)  98 F (36.7 C)  TempSrc: Oral Oral  Oral  SpO2: 98% 96%  97%  Weight: 160 lb 0.9 oz (72.6 kg)       Intake/Output Summary (Last 24 hours) at 01/12/2018 1428 Last data filed at 01/12/2018 0901 Gross per 24 hour  Intake 480 ml  Output 250 ml  Net 230 ml   Filed Weights   01/12/18 0400  Weight: 160 lb 0.9 oz (72.6 kg)    Telemetry    SR  - Personally Reviewed  ECG    SR with inf, lat ST  Depression and Ant T wave changes - Personally Reviewed  Physical Exam   GEN: No acute distress.   Neck: No JVD Cardiac: RRR, no murmurs, rubs, or gallops.  Respiratory: Clear to auscultation bilaterally. No rales notes  GI: Soft, nontender, non-distended  MS: No edema; No deformity. Neuro:  Nonfocal  Psych: Normal affect   Labs    Chemistry Recent Labs  Lab 01/11/18 0351  01/12/18 0335  NA 140 138  K 5.1 5.6*  CL 109 109  CO2 19* 21*  GLUCOSE 257* 159*  BUN 46* 49*  CREATININE 3.67* 3.87*  CALCIUM 9.3 8.4*  PROT 7.2  --   ALBUMIN 3.4*  --   AST 23  --   ALT 16  --   ALKPHOS 109  --   BILITOT 0.7  --   GFRNONAA 12* 11*  GFRAA 14* 13*  ANIONGAP 12 8     Hematology Recent Labs  Lab 01/11/18 0351 01/12/18 0335  WBC 11.0* 10.2  RBC 4.12 3.14*  HGB 10.8* 8.2*  HCT 35.2* 26.9*  MCV 85.4 85.7  MCH 26.2 26.1  MCHC 30.7 30.5  RDW 13.1 13.0  PLT 329 335    Cardiac Enzymes Recent Labs  Lab 01/11/18 1014 01/11/18 1602 01/11/18 2155  TROPONINI 6.81* 9.71* 10.54*    Recent Labs  Lab 01/11/18 0440  TROPIPOC 0.22*     BNP Recent Labs  Lab 01/11/18 0357  BNP 61.2     DDimer No results for input(s): DDIMER in the last 168 hours.   Radiology    Dg Chest 2 View  Result Date: 01/11/2018 CLINICAL DATA:  Chest pain, nausea, hypertensive. EXAM: CHEST - 2 VIEW COMPARISON:  Chest radiograph  March 23, 2017 FINDINGS: Cardiomediastinal silhouette is unremarkable for this low inspiratory examination with crowded vasculature markings. The lungs are clear without pleural effusions or focal consolidations. Persistently elevated RIGHT hemidiaphragm. Trachea projects midline and there is no pneumothorax. Included soft tissue planes and osseous structures are non-suspicious. IMPRESSION: No active cardiopulmonary disease. Electronically Signed   By: Elon Alas M.D.   On: 01/11/2018 04:56   US Renal  Result Date: 01/12/2018 CLINICAL DATA:  Acute renal insufficiency, hypertension, diabetes, chronic kidney disease EXAM: RENAL / URINARY TRACT ULTRASOUND COMPLETE COMPARISON:  Ultrasound the abdomen of 12/26/2016 FINDINGS: Right Kidney: Length: 11.4 cm. No hydronephrosis is seen. There is a complex cyst present in the upper pole of 1.7 cm in diameter. The echogenicity of the right renal parenchyma does appear to be increased consistent with chronic renal  medical disease. Left Kidney: Length: 10.5 cm. No hydronephrosis is seen. The echogenicity of the left kidney also is somewhat increased. Bladder: The urinary bladder is not well distended but no abnormality is seen. IMPRESSION: 1. No hydronephrosis. 2. Somewhat echogenic renal parenchyma consistent with chronic renal medical disease. Electronically Signed   By: Ivar Drape M.D.   On: 01/12/2018 12:59    Cardiac Studies   Echo Pending  RHC/LHC 04/14/15  Prox LAD to Mid LAD lesion, 90% stenosed. Diffuse disease  Ost Cx to Mid Cx lesion, 95% stenosed.  Ost 2nd Mrg to 2nd Mrg lesion, 80% stenosed.  Mid Cx lesion, 90% stenosed.  Prox RCA lesion, 60% stenosed.  Mid RCA lesion, 50% stenosed.   Patient Profile     67 y.o. female with a hx of DM2, HTN, HLD, CVA, R-AKA, known severe CAD rx medically due to poor renal function (not CABG candidate), now admitted with chest pain.    Assessment & Plan    NSTEMI with troponin pk of 10.54  --IV NTG and IV heparin is feeling better, continue for 24 more hours and then change to po imdur  if medical therapy only  -- Not candidate for CABG per Dr Prescott Gum due to comorbidities and poor targets.  -- on bb and now lipitor to 80 and on ASA and Brilinta at home is on Imdur 60 in AM and 30 in the evening  -- Cr is 3.87   --Dr. Claiborne Billings to see concerning decision for cath.  Difficult situation with CKD --Echo pending --known CAD  With last cath 2016   Chronic combined systolic and diastolic HF  --mildly decreased EF G1DD 45-50%  --euvolemic today  --Lasix 40 mg daily currently on hold due to increase of Cr same for cozaar   AKI Cr increased to 3.87   Her usual is 2.72  HTN --controlled today to low   PAD s/p R AKA    DM-2 per IM  HLD continue statin  palliative care is seeing the pt.      For questions or updates, please contact [place] Please consult [email or web address] for contact info under Cardiology/STEMI.      Signed, Cecilie Kicks, NP  01/12/2018, 2:28 PM     Patient seen and examined. Agree with assessment and plan.  Patient feels better today and continues to be on heparin and IV nitroglycerin.  Troponin has increased to 10.54.  BNP yesterday was normal at 61 suggesting euvolemia.  Creatinine is increased to 3.87.  Hemoglobin has decreased to 8.2/hematocrit 26.9.  Lipid studies reveal LDL cholesterol at 118.  She is now on atorvastatin 80 mg.  Her ventricular rate  is in the 80s and she is maintaining sinus rhythm.  I will further titrate carvedilol to 18.75 mg twice a day with ultimate plan to increase to 25 mg twice daily.  He is on aspirin and Brilinta.  We will check stool guaiacs as well as iron studies.  ECG today is improved showing normal sinus rhythm at 78 bpm.  The ST-T changes inferolaterally are less.  QTc interval is 4 6 5  ms.  There are increased troponin, I will schedule for follow-up echo Doppler study to reassess LV systolic and diastolic function.  Previously she had been turned down for CABG surgery.  We will continue with aggressive conservative management unless significant change in symptomatology.   Troy Sine, MD, [facility] 01/12/2018 6:02 PM

## 2018-01-12 NOTE — Progress Notes (Addendum)
[facility] TEAM 1 - Stepdown/ICU TEAM  Jenna Brown  BOF:[id number] DOB: 09-25-1950 DOA: 01/11/2018 PCP: [name], MD    Brief Narrative:  67 yo w/ a Hx of DM; TIA; PVD; CAD with multivessel disease; HTN; R AKA; and HLD who presented with substernal chest pain which woke her from sleep.  Significant Events: 4/25 admit w/ chest pain   Subjective: The patient is resting comfortably in bed.  She denies chest pain shortness of breath nausea vomiting or abdominal pain at this time.  Assessment & Plan:  NSTEMI - Chest pain - Multivessel CAD Troponin peaked at 10.54 -  Cards following to determine extent of further w/u   HTN BP well controlled  HLD Cont med tx   DM w/ renal complications  CBG currently well controlled  Acute kidney injury on CKD Stage 4 crt 2.72 August 2018 - follow trend - stop diuretic - was on ARB as outpt - check renal US   Chronic systolic CHF  TTE July 7782 noted EF 45-50% - no gross volume overload at this time    DVT prophylaxis: IV heparin  Code Status: FULL CODE Family Communication: no family present at time of exam  Disposition Plan: SDU  Consultants:  Cards   Antimicrobials:  none  Objective: Blood pressure (!) 110/55, pulse 83, temperature 97.9 F (36.6 C), temperature source Oral, resp. rate 11, weight 72.6 kg (160 lb 0.9 oz), SpO2 96 %.  Intake/Output Summary (Last 24 hours) at 01/12/2018 1110 Last data filed at 01/12/2018 0901 Gross per 24 hour  Intake 480 ml  Output 1250 ml  Net -770 ml   Filed Weights   01/12/18 0400  Weight: 72.6 kg (160 lb 0.9 oz)    Examination: General: No acute respiratory distress Lungs: Clear to auscultation bilaterally without wheezes or crackles Cardiovascular: Regular rate and rhythm without murmur gallop or rub normal S1 and S2 Abdomen: Nontender, nondistended, soft, bowel sounds positive, no rebound, no ascites, no appreciable mass Extremities: No significant cyanosis, clubbing, or  edema bilateral lower extremities  CBC: Recent Labs  Lab 01/11/18 0351 01/12/18 0335  WBC 11.0* 10.2  HGB 10.8* 8.2*  HCT 35.2* 26.9*  MCV 85.4 85.7  PLT 329 423   Basic Metabolic Panel: Recent Labs  Lab 01/11/18 0351 01/12/18 0335  NA 140 138  K 5.1 5.6*  CL 109 109  CO2 19* 21*  GLUCOSE 257* 159*  BUN 46* 49*  CREATININE 3.67* 3.87*  CALCIUM 9.3 8.4*   GFR: CrCl cannot be calculated (Unknown ideal weight.).  Liver Function Tests: Recent Labs  Lab 01/11/18 0351  AST 23  ALT 16  ALKPHOS 109  BILITOT 0.7  PROT 7.2  ALBUMIN 3.4*   Recent Labs  Lab 01/11/18 0351  LIPASE 46    Cardiac Enzymes: Recent Labs  Lab 01/11/18 1014 01/11/18 1602 01/11/18 2155  TROPONINI 6.81* 9.71* 10.54*    HbA1C: Hgb A1c MFr Bld  Date/Time Value Ref Range Status  01/11/2018 10:14 AM 6.8 (H) 4.8 - 5.6 % Final    Comment:    (NOTE) Pre diabetes:          5.7%-6.4% Diabetes:              >6.4% Glycemic control for   <7.0% adults with diabetes   12/25/2016 04:49 AM 8.2 (H) 4.8 - 5.6 % Final    Comment:    (NOTE)         Pre-diabetes: 5.7 - 6.4  Diabetes: >6.4         Glycemic control for adults with diabetes: <7.0     CBG: Recent Labs  Lab 01/11/18 1217 01/11/18 1804 01/11/18 2057 01/12/18 0602  GLUCAP 146* 162* 124* 114*     Scheduled Meds: . amLODipine  7.5 mg Oral Daily  . aspirin  81 mg Oral Daily  . atorvastatin  80 mg Oral q1800  . carvedilol  18.75 mg Oral BID WC  . furosemide  40 mg Oral Daily  . insulin aspart  0-15 Units Subcutaneous TID WC  . insulin aspart  0-5 Units Subcutaneous QHS  . insulin glargine  25 Units Subcutaneous BID  . sodium chloride flush  3 mL Intravenous Q12H  . ticagrelor  90 mg Oral BID   Continuous Infusions: . sodium chloride    . heparin 900 Units/hr (01/12/18 0359)  . nitroGLYCERIN 30 mcg/min (01/11/18 1530)     LOS: 1 day   Cherene Altes, MD [facility] Office  [phone] Pager -  Text Page per Amion as per below:  On-Call/Text Page:      [email or web address]      password [email or web address]  If 7PM-7AM, please contact night-coverage [email or web address] Password [email or web address] 01/12/2018, 11:10 AM

## 2018-01-13 ENCOUNTER — Other Ambulatory Visit: Payer: Self-pay

## 2018-01-13 LAB — CBC
HCT: 25.5 % — ABNORMAL LOW (ref 36.0–46.0)
HCT: 25.4 % — ABNORMAL LOW (ref 36.0–46.0)
Hemoglobin: 7.9 g/dL — ABNORMAL LOW (ref 12.0–15.0)
Hemoglobin: 7.8 g/dL — ABNORMAL LOW (ref 12.0–15.0)
MCH: 26.2 pg (ref 26.0–34.0)
MCH: 26 pg (ref 26.0–34.0)
MCHC: 31 g/dL (ref 30.0–36.0)
MCHC: 30.7 g/dL (ref 30.0–36.0)
MCV: 84.4 fL (ref 78.0–100.0)
MCV: 84.7 fL (ref 78.0–100.0)
Platelets: 310 10*3/uL (ref 150–400)
Platelets: 297 10*3/uL (ref 150–400)
RBC: 3.02 MIL/uL — ABNORMAL LOW (ref 3.87–5.11)
RBC: 3 MIL/uL — ABNORMAL LOW (ref 3.87–5.11)
RDW: 12.9 % (ref 11.5–15.5)
RDW: 12.8 % (ref 11.5–15.5)
WBC: 9.8 10*3/uL (ref 4.0–10.5)
WBC: 9.3 10*3/uL (ref 4.0–10.5)

## 2018-01-13 LAB — COMPREHENSIVE METABOLIC PANEL WITH GFR
ALT: 16 U/L (ref 14–54)
AST: 25 U/L (ref 15–41)
Albumin: 2.7 g/dL — ABNORMAL LOW (ref 3.5–5.0)
Alkaline Phosphatase: 78 U/L (ref 38–126)
Anion gap: 11 (ref 5–15)
BUN: 62 mg/dL — ABNORMAL HIGH (ref 6–20)
CO2: 22 mmol/L (ref 22–32)
Calcium: 8.3 mg/dL — ABNORMAL LOW (ref 8.9–10.3)
Chloride: 103 mmol/L (ref 101–111)
Creatinine, Ser: 4.29 mg/dL — ABNORMAL HIGH (ref 0.44–1.00)
GFR calc Af Amer: 12 mL/min — ABNORMAL LOW
GFR calc non Af Amer: 10 mL/min — ABNORMAL LOW
Glucose, Bld: 122 mg/dL — ABNORMAL HIGH (ref 65–99)
Potassium: 5 mmol/L (ref 3.5–5.1)
Sodium: 136 mmol/L (ref 135–145)
Total Bilirubin: 0.6 mg/dL (ref 0.3–1.2)
Total Protein: 5.7 g/dL — ABNORMAL LOW (ref 6.5–8.1)

## 2018-01-13 LAB — GLUCOSE, CAPILLARY
GLUCOSE-CAPILLARY: 148 mg/dL — AB (ref 65–99)
GLUCOSE-CAPILLARY: 161 mg/dL — AB (ref 65–99)
Glucose-Capillary: 100 mg/dL — ABNORMAL HIGH (ref 65–99)
Glucose-Capillary: 118 mg/dL — ABNORMAL HIGH (ref 65–99)

## 2018-01-13 LAB — TROPONIN I: Troponin I: 5.52 ng/mL

## 2018-01-13 LAB — PREPARE RBC (CROSSMATCH)

## 2018-01-13 LAB — HEPARIN LEVEL (UNFRACTIONATED): Heparin Unfractionated: 0.36 [IU]/mL (ref 0.30–0.70)

## 2018-01-13 MED ORDER — SODIUM CHLORIDE 0.9 % IV BOLUS
250.0000 mL | Freq: Once | INTRAVENOUS | Status: AC
  Administered 2018-01-13: 250 mL via INTRAVENOUS

## 2018-01-13 MED ORDER — HEPARIN SODIUM (PORCINE) 5000 UNIT/ML IJ SOLN: 5000.0000 [IU] | Freq: Three times a day (TID) | INTRAMUSCULAR | Status: DC

## 2018-01-13 MED ORDER — SODIUM CHLORIDE 0.9 % IV SOLN: Freq: Once | INTRAVENOUS | Status: DC

## 2018-01-13 MED ORDER — ISOSORBIDE MONONITRATE ER 30 MG PO TB24
30.0000 mg | ORAL_TABLET | Freq: Every day | ORAL | Status: DC
  Administered 2018-01-13 – 2018-01-14 (×2): 30 mg via ORAL
  Filled 2018-01-13 (×2): qty 1

## 2018-01-13 NOTE — Progress Notes (Signed)
[facility] TEAM 1 - Stepdown/ICU TEAM  Jenna Brown  [name] DOB: [birth year] DOA: 01/11/2018 PCP: [name], MD    Brief Narrative:  67 yo w/ a Hx of DM; TIA; PVD; CAD with multivessel disease; HTN; R AKA; and HLD who presented with substernal chest pain which woke her from sleep.  Significant Events: 4/25 admit w/ chest pain   Subjective: The patient is resting comfortably in a bedside chair.  She denies current chest pain shortness of breath nausea vomiting or abdominal pain.  She is alert oriented and quite pleasant.  Assessment & Plan:  NSTEMI - Chest pain - Multivessel CAD Troponin peaked at 10.54 -  Cards following to determine extent of further w/u - previously determined to not be a candidate for CABG - cont medical management   HTN BP well controlled  HLD Cont med tx   DM w/ renal complications  CBG currently well controlled  Acute kidney injury on CKD Stage 4 crt 2.72 August 2018 - follow trend - stopped diuretic - was on ARB as outpt - renal US w/o acute findings - gently hydrate today and follow   Recent Labs  Lab 01/11/18 0351 01/12/18 0335 01/13/18 0229  CREATININE 3.67* 3.87* 4.29*     Normocytic anemia No evidence of gross blood loss at this time, but pt has been on IV heparin - recheck CBC this afternoon - transfuse if Hgb remains < 8.0 given CAD and AKI - heparin being stopped today anyway  Chronic diastolic and systolic CHF  TTE July 3474 noted EF 45-50% w/ grade 1 DD - no gross volume overload at this time on exam despite climbing weight  Filed Weights   01/12/18 0400 01/13/18 0410  Weight: 72.6 kg (160 lb 0.9 oz) 78 kg (172 lb)     DVT prophylaxis:  SCDs Code Status: FULL CODE Family Communication: no family present at time of exam  Disposition Plan: tele bed   Consultants:  Cards   Antimicrobials:  none  Objective: Blood pressure 118/69, pulse 80, temperature 98.1 F (36.7 C), temperature source Oral, resp. rate  (!) 21, height 5\' 4"  (1.626 m), weight 78 kg (172 lb), SpO2 95 %.  Intake/Output Summary (Last 24 hours) at 01/13/2018 1123 Last data filed at 01/13/2018 0800 Gross per 24 hour  Intake 958.38 ml  Output 1150 ml  Net -191.62 ml   Filed Weights   01/12/18 0400 01/13/18 0410  Weight: 72.6 kg (160 lb 0.9 oz) 78 kg (172 lb)    Examination: General: No acute respiratory distress Lungs: Clear to auscultation bilaterally - no wheezing  Cardiovascular: Regular rate and rhythm without murmur  Abdomen: NT/ND, soft, bs+, no mass  Extremities: No signif edema B LE   CBC: Recent Labs  Lab 01/11/18 0351 01/12/18 0335 01/13/18 0229  WBC 11.0* 10.2 9.8  HGB 10.8* 8.2* 7.9*  HCT 35.2* 26.9* 25.5*  MCV 85.4 85.7 84.4  PLT 329 335 259   Basic Metabolic Panel: Recent Labs  Lab 01/11/18 0351 01/12/18 0335 01/13/18 0229  NA 140 138 136  K 5.1 5.6* 5.0  CL 109 109 103  CO2 19* 21* 22  GLUCOSE 257* 159* 122*  BUN 46* 49* 62*  CREATININE 3.67* 3.87* 4.29*  CALCIUM 9.3 8.4* 8.3*   GFR: Estimated Creatinine Clearance: 13.4 mL/min (A) (by C-G formula based on SCr of 4.29 mg/dL (H)).  Liver Function Tests: Recent Labs  Lab 01/11/18 0351 01/13/18 0229  AST 23 25  ALT 16  16  ALKPHOS 109 78  BILITOT 0.7 0.6  PROT 7.2 5.7*  ALBUMIN 3.4* 2.7*   Recent Labs  Lab 01/11/18 0351  LIPASE 46    Cardiac Enzymes: Recent Labs  Lab 01/11/18 1014 01/11/18 1602 01/11/18 2155 01/13/18 0229  TROPONINI 6.81* 9.71* 10.54* 5.52*    HbA1C: Hgb A1c MFr Bld  Date/Time Value Ref Range Status  01/11/2018 10:14 AM 6.8 (H) 4.8 - 5.6 % Final    Comment:    (NOTE) Pre diabetes:          5.7%-6.4% Diabetes:              >6.4% Glycemic control for   <7.0% adults with diabetes   12/25/2016 04:49 AM 8.2 (H) 4.8 - 5.6 % Final    Comment:    (NOTE)         Pre-diabetes: 5.7 - 6.4         Diabetes: >6.4         Glycemic control for adults with diabetes: <7.0     CBG: Recent Labs  Lab  01/12/18 0602 01/12/18 1328 01/12/18 1634 01/12/18 2049 01/13/18 0608  GLUCAP 114* 168* 122* 112* 100*     Scheduled Meds: . amLODipine  5 mg Oral Daily  . aspirin  81 mg Oral Daily  . atorvastatin  80 mg Oral q1800  . carvedilol  18.75 mg Oral BID WC  . insulin aspart  0-15 Units Subcutaneous TID WC  . insulin aspart  0-5 Units Subcutaneous QHS  . insulin glargine  25 Units Subcutaneous BID  . ticagrelor  90 mg Oral BID     LOS: 2 days   Cherene Altes, MD [facility] Office  [phone] Pager - Text Page per Shea Evans as per below:  On-Call/Text Page:      [email or web address]      password [email or web address]  If 7PM-7AM, please contact night-coverage [email or web address] Password [facility] 01/13/2018, 11:23 AM

## 2018-01-13 NOTE — Progress Notes (Signed)
Progress Note  Patient Name: Jenna Brown Date of Encounter: 01/13/2018  Primary Cardiologist: Glori Bickers, MD   Subjective   Feeling better, on IV NTG and heparin.  No pain since the admission night.  Inpatient Medications    Scheduled Meds: . amLODipine  5 mg Oral Daily  . aspirin  81 mg Oral Daily  . atorvastatin  80 mg Oral q1800  . carvedilol  18.75 mg Oral BID WC  . insulin aspart  0-15 Units Subcutaneous TID WC  . insulin aspart  0-5 Units Subcutaneous QHS  . insulin glargine  25 Units Subcutaneous BID  . ticagrelor  90 mg Oral BID   Continuous Infusions: . sodium chloride 20 mL/hr at 01/12/18 1335  . heparin 900 Units/hr (01/13/18 0850)  . nitroGLYCERIN 25 mcg/min (01/13/18 0858)   PRN Meds: ALPRAZolam, calcium carbonate (dosed in mg elemental calcium), camphor-menthol **AND** hydrOXYzine, docusate sodium, feeding supplement (NEPRO CARB STEADY), hydrALAZINE, morphine injection, ondansetron **OR** ondansetron (ZOFRAN) IV, sorbitol, zolpidem   Vital Signs    Vitals:   01/13/18 0011 01/13/18 0153 01/13/18 0410 01/13/18 0844  BP: (!) 110/56  101/78 118/69  Pulse: 81  84 80  Resp: (!) 22  17 (!) 21  Temp: 98.4 F (36.9 C)  97.7 F (36.5 C) 98.1 F (36.7 C)  TempSrc: Oral  Oral Oral  SpO2: 93%  99% 95%  Weight:   172 lb (78 kg)   Height:  5\' 4"  (1.626 m)      Intake/Output Summary (Last 24 hours) at 01/13/2018 1119 Last data filed at 01/13/2018 0800 Gross per 24 hour  Intake 958.38 ml  Output 1150 ml  Net -191.62 ml   Filed Weights   01/12/18 0400 01/13/18 0410  Weight: 160 lb 0.9 oz (72.6 kg) 172 lb (78 kg)   Telemetry    SR  - Personally Reviewed  ECG    SR with inf, lat ST  Depression and Ant T wave changes - Personally Reviewed  Physical Exam   GEN: No acute distress.   Neck: No JVD Cardiac: RRR, no murmurs, rubs, or gallops.  Respiratory: Clear to auscultation bilaterally. No rales notes  GI: Soft, nontender, non-distended  MS:  No edema; No deformity. Neuro:  Nonfocal  Psych: Normal affect   Labs    Chemistry Recent Labs  Lab 01/11/18 0351 01/12/18 0335 01/13/18 0229  NA 140 138 136  K 5.1 5.6* 5.0  CL 109 109 103  CO2 19* 21* 22  GLUCOSE 257* 159* 122*  BUN 46* 49* 62*  CREATININE 3.67* 3.87* 4.29*  CALCIUM 9.3 8.4* 8.3*  PROT 7.2  --  5.7*  ALBUMIN 3.4*  --  2.7*  AST 23  --  25  ALT 16  --  16  ALKPHOS 109  --  78  BILITOT 0.7  --  0.6  GFRNONAA 12* 11* 10*  GFRAA 14* 13* 12*  ANIONGAP 12 8 11      Hematology Recent Labs  Lab 01/11/18 0351 01/12/18 0335 01/13/18 0229  WBC 11.0* 10.2 9.8  RBC 4.12 3.14* 3.02*  HGB 10.8* 8.2* 7.9*  HCT 35.2* 26.9* 25.5*  MCV 85.4 85.7 84.4  MCH 26.2 26.1 26.2  MCHC 30.7 30.5 31.0  RDW 13.1 13.0 12.9  PLT 329 335 310    Cardiac Enzymes Recent Labs  Lab 01/11/18 1014 01/11/18 1602 01/11/18 2155 01/13/18 0229  TROPONINI 6.81* 9.71* 10.54* 5.52*    Recent Labs  Lab 01/11/18 0440  TROPIPOC 0.22*  BNP Recent Labs  Lab 01/11/18 0357  BNP 61.2     DDimer No results for input(s): DDIMER in the last 168 hours.   Radiology    US Renal  Result Date: 01/12/2018 CLINICAL DATA:  Acute renal insufficiency, hypertension, diabetes, chronic kidney disease EXAM: RENAL / URINARY TRACT ULTRASOUND COMPLETE COMPARISON:  Ultrasound the abdomen of 12/26/2016 FINDINGS: Right Kidney: Length: 11.4 cm. No hydronephrosis is seen. There is a complex cyst present in the upper pole of 1.7 cm in diameter. The echogenicity of the right renal parenchyma does appear to be increased consistent with chronic renal medical disease. Left Kidney: Length: 10.5 cm. No hydronephrosis is seen. The echogenicity of the left kidney also is somewhat increased. Bladder: The urinary bladder is not well distended but no abnormality is seen. IMPRESSION: 1. No hydronephrosis. 2. Somewhat echogenic renal parenchyma consistent with chronic renal medical disease. Electronically Signed    By: Ivar Drape M.D.   On: 01/12/2018 12:59    Cardiac Studies   Echo Pending  RHC/LHC 04/14/15  Prox LAD to Mid LAD lesion, 90% stenosed. Diffuse disease  Ost Cx to Mid Cx lesion, 95% stenosed.  Ost 2nd Mrg to 2nd Mrg lesion, 80% stenosed.  Mid Cx lesion, 90% stenosed.  Prox RCA lesion, 60% stenosed.  Mid RCA lesion, 50% stenosed.   Patient Profile     67 y.o. female with a hx of DM2, HTN, HLD, CVA, R-AKA, known severe CAD rx medically due to poor renal function (not CABG candidate), now admitted with chest pain.    Assessment & Plan    NSTEMI with troponin pk of 10.54 -->5.5 --IV NTG and IV heparin is feeling better, troponin trending down, I will d/c both, start imdur 30 mg po daily -- Not candidate for CABG per Dr Prescott Gum due to comorbidities and poor targets.  -- on bb and now lipitor to 80 and on ASA and Brilinta at home is on Imdur 60 in AM and 30 in the evening   Chronic combined systolic and diastolic HF  --mildly decreased EF G1DD 45-50%  --euvolemic today  --Lasix 40 mg daily currently on hold due to increase of Cr same for cozaar   AKI Cr increased to 4.29   Her usual is 2.72  HTN --controlled today to low   PAD s/p R AKA    DM-2 per IM  HLD continue statin  palliative care is seeing the pt.   For questions or updates, please contact [place] Please consult [email or web address] for contact info under Cardiology/STEMI.   Signed, Ena Dawley, MD  01/13/2018, 11:19 AM

## 2018-01-13 NOTE — Plan of Care (Signed)

## 2018-01-13 NOTE — Progress Notes (Signed)
[name] for Heparin  Indication: chest pain/ACS  Allergies  Allergen Reactions  . Plavix [Clopidogrel Bisulfate] Itching  . Codeine Other (See Comments)    "makes me feel strange"  . Tylenol With Codeine #3 [Acetaminophen-Codeine] Other (See Comments)    "doesn't make me feel right"    Vital Signs: Temp: 98.1 F (36.7 C) (04/27 0844) Temp Source: Oral (04/27 0844) BP: 118/69 (04/27 0844) Pulse Rate: 80 (04/27 0844)  Labs: Recent Labs    01/11/18 0351  01/11/18 1403 01/11/18 1602 01/11/18 2155 01/12/18 0335 01/12/18 1039 01/13/18 0229  HGB 10.8*  --   --   --   --  8.2*  --  7.9*  HCT 35.2*  --   --   --   --  26.9*  --  25.5*  PLT 329  --   --   --   --  335  --  310  HEPARINUNFRC  --   --  0.57  --   --   --  0.52 0.36  CREATININE 3.67*  --   --   --   --  3.87*  --  4.29*  TROPONINI  --    < >  --  9.71* 10.54*  --   --  5.52*   < > = values in this interval not displayed.    Estimated Creatinine Clearance: 13.4 mL/min (A) (by C-G formula based on SCr of 4.29 mg/dL (H)).  Assessment: [name] to start heparin for CP/mildly elevated troponin (troponin 0.22>6.81>10.54>5.52 - trending back down).   Heparin level remains therapeutic at 0.36, on 900 units/hr. Hgb 7.9, plts 310. No bleed documented. No infusion issues. Deemed not a candidate for CABG due to comorbidities and poor targets.  Goal of Therapy:  Heparin level 0.3-0.7 units/ml Monitor platelets by anticoagulation protocol: Yes   Plan:  Continue heparin drip at 900 units/hr Monitor daily heparin level and CBC, s/sx bleeding  Thank you,  Jenna Brown, PharmD Clinical Pharmacist  Pager: [phone] Clinical Phone for 01/13/2018 until 3:30pm: [phone] If after 3:30pm, please call main pharmacy at [phone] 01/13/2018 10:28 AM

## 2018-01-14 DIAGNOSIS — N179 Acute kidney failure, unspecified: Secondary | ICD-10-CM

## 2018-01-14 DIAGNOSIS — N171 Acute kidney failure with acute cortical necrosis: Secondary | ICD-10-CM

## 2018-01-14 LAB — RETICULOCYTES
RBC.: 3.48 MIL/uL — ABNORMAL LOW (ref 3.87–5.11)
RETIC COUNT ABSOLUTE: 45.2 10*3/uL (ref 19.0–186.0)
RETIC CT PCT: 1.3 % (ref 0.4–3.1)

## 2018-01-14 LAB — CBC
HEMATOCRIT: 29.4 % — AB (ref 36.0–46.0)
HEMOGLOBIN: 9.2 g/dL — AB (ref 12.0–15.0)
MCH: 26.4 pg (ref 26.0–34.0)
MCHC: 31.3 g/dL (ref 30.0–36.0)
MCV: 84.5 fL (ref 78.0–100.0)
Platelets: 282 10*3/uL (ref 150–400)
RBC: 3.48 MIL/uL — AB (ref 3.87–5.11)
RDW: 13.2 % (ref 11.5–15.5)
WBC: 11.4 10*3/uL — ABNORMAL HIGH (ref 4.0–10.5)

## 2018-01-14 LAB — COMPREHENSIVE METABOLIC PANEL
ALBUMIN: 2.7 g/dL — AB (ref 3.5–5.0)
ALK PHOS: 76 U/L (ref 38–126)
ALT: 17 U/L (ref 14–54)
ANION GAP: 11 (ref 5–15)
AST: 22 U/L (ref 15–41)
BILIRUBIN TOTAL: 1 mg/dL (ref 0.3–1.2)
BUN: 65 mg/dL — ABNORMAL HIGH (ref 6–20)
CALCIUM: 8.3 mg/dL — AB (ref 8.9–10.3)
CO2: 22 mmol/L (ref 22–32)
Chloride: 105 mmol/L (ref 101–111)
Creatinine, Ser: 4.09 mg/dL — ABNORMAL HIGH (ref 0.44–1.00)
GFR calc Af Amer: 12 mL/min — ABNORMAL LOW (ref >60)
GFR calc non Af Amer: 11 mL/min — ABNORMAL LOW (ref >60)
GLUCOSE: 145 mg/dL — AB (ref 65–99)
Potassium: 5.2 mmol/L — ABNORMAL HIGH (ref 3.5–5.1)
SODIUM: 138 mmol/L (ref 135–145)
TOTAL PROTEIN: 5.9 g/dL — AB (ref 6.5–8.1)

## 2018-01-14 LAB — BPAM RBC
Blood Product Expiration Date: 201905092359
ISSUE DATE / TIME: 201904272239
Unit Type and Rh: 6200

## 2018-01-14 LAB — IRON AND TIBC
Iron: 86 ug/dL (ref 28–170)
Saturation Ratios: 32 % — ABNORMAL HIGH (ref 10.4–31.8)
TIBC: 273 ug/dL (ref 250–450)
UIBC: 187 ug/dL

## 2018-01-14 LAB — GLUCOSE, CAPILLARY
GLUCOSE-CAPILLARY: 122 mg/dL — AB (ref 65–99)
Glucose-Capillary: 164 mg/dL — ABNORMAL HIGH (ref 65–99)
Glucose-Capillary: 264 mg/dL — ABNORMAL HIGH (ref 65–99)
Glucose-Capillary: 118 mg/dL — ABNORMAL HIGH (ref 65–99)

## 2018-01-14 LAB — FOLATE: Folate: 11.3 ng/mL (ref >5.9)

## 2018-01-14 LAB — HEPARIN LEVEL (UNFRACTIONATED)

## 2018-01-14 LAB — TYPE AND SCREEN
ABO/RH(D): A POS
Antibody Screen: NEGATIVE
UNIT DIVISION: 0

## 2018-01-14 LAB — VITAMIN B12: Vitamin B-12: 325 pg/mL (ref 180–914)

## 2018-01-14 LAB — FERRITIN: FERRITIN: 135 ng/mL (ref 11–307)

## 2018-01-14 MED ORDER — SODIUM CHLORIDE 0.9 % IV SOLN
INTRAVENOUS | Status: DC
  Administered 2018-01-14: 23:00:00 via INTRAVENOUS

## 2018-01-14 MED ORDER — HEPARIN SODIUM (PORCINE) 5000 UNIT/ML IJ SOLN
5000.0000 [IU] | Freq: Three times a day (TID) | INTRAMUSCULAR | Status: DC
  Administered 2018-01-14 – 2018-01-15 (×3): 5000 [IU] via SUBCUTANEOUS
  Filled 2018-01-14: qty 1

## 2018-01-14 MED ORDER — ISOSORBIDE MONONITRATE ER 30 MG PO TB24
30.0000 mg | ORAL_TABLET | Freq: Two times a day (BID) | ORAL | Status: DC
  Administered 2018-01-14 – 2018-01-15 (×2): 30 mg via ORAL
  Filled 2018-01-14 (×2): qty 1

## 2018-01-14 NOTE — Progress Notes (Addendum)
PROGRESS NOTE    Jenna Brown  IFO:[id number] DOB: [birth year] DOA: 01/11/2018 PCP: [name], MD   Brief Narrative: Patient is a 67 year old female with past medical history of diabetes, TIA, PVD, multivessel coronary artery disease, hypertension, right AKA, hyperlipidemia who presented with chest pain.  Patient is currently being managed for NSTEMI.  Cardiology following.    Assessment & Plan:   Principal Problem:   NSTEMI (non-ST elevated myocardial infarction) ([place]) Active Problems:   Essential hypertension   HLD (hyperlipidemia)   Chronic systolic heart failure (HCC)   CAD in native artery   Chronic kidney disease (CKD), stage IV (severe) ([place])   Insulin-requiring or dependent type II diabetes mellitus ([place])   Goals of care, counseling/discussion   Palliative care by specialist   Chronic kidney disease, stage V ([place])   Acute kidney failure ([place])  NSTEMI: Denies any chest pain.  She has multivessel coronary disease.  Currently medically managed.  Cardiology following.  Heparin drip stopped.  Started on Imdur.  Not a candidate for CABG. Continue aspirin and Brilinta.  Continue beta-blocker.  Hypertension: Currently blood pressure stable.  We will continue to monitor  Acute kidney injury on CKD stage IV: Creatinine 2.72 on August 2018.  Creatinine in the range of 4.  Will start on gentle IV fluids.  Renal ultrasound   consistent with chronic renal medical disease.  Diabetes mellitus with renal complication: Continue to monitor blood sugars. Continue sliding scale insulin and lantus.  Hyperlipidemia: Continue statin  Anemia: Currently H and H stable.  Chronic diastolic and systolic CHF: Echocardiogram done in July 2018 showed EF of 40-45% with grade 1 diastolic dysfunction.  Currently compensated.  Lasix on hold.  History of peripheral artery disease: Status post right AKA.   DVT prophylaxis: Heparin [place] Code Status: Full Family Communication: None present at  the bedside Disposition Plan: As per cardiology   Consultants: Cardiology  Procedures: None  Antimicrobials: None  Subjective: Patient seen and examined the bedside this morning.  Remains comfortable.  No new issues/events.  Objective: Vitals:   01/13/18 2315 01/14/18 0200 01/14/18 0445 01/14/18 0832  BP: (!) 128/55 113/72 (!) 145/59 (!) 137/58  Pulse: 79 78 81 71  Resp:   17   Temp: 98.4 F (36.9 C) (!) 97.3 F (36.3 C) 98.1 F (36.7 C)   TempSrc: Oral Oral Oral   SpO2: 95% 98% 98%   Weight:   73.8 kg (162 lb 12.8 oz)   Height:        Intake/Output Summary (Last 24 hours) at 01/14/2018 1358 Last data filed at 01/13/2018 2000 Gross per 24 hour  Intake 667.33 ml  Output -  Net 667.33 ml   Filed Weights   01/12/18 0400 01/13/18 0410 01/14/18 0445  Weight: 72.6 kg (160 lb 0.9 oz) 78 kg (172 lb) 73.8 kg (162 lb 12.8 oz)    Examination:  General exam: Appears calm and comfortable ,Not in distress,average built HEENT:PERRL,Oral mucosa moist, Ear/Nose normal on gross exam Respiratory system: Bilateral equal air entry, normal vesicular breath sounds, no wheezes or crackles  Cardiovascular system: S1 & S2 heard, RRR. No JVD, murmurs, rubs, gallops or clicks. No pedal edema. Gastrointestinal system: Abdomen is nondistended, soft and nontender. No organomegaly or masses felt. Normal bowel sounds heard. Central nervous system: Alert and oriented. No focal neurological deficits. Extremities: No edema, no clubbing ,no cyanosis, distal peripheral pulses palpable.Right AKA Skin: No rashes, lesions or ulcers,no icterus ,no pallor MSK: Normal muscle bulk,tone ,power Psychiatry:  Judgement and insight appear normal. Mood & affect appropriate.     Data Reviewed: I have personally reviewed following labs and imaging studies  CBC: Recent Labs  Lab 01/11/18 0351 01/12/18 0335 01/13/18 0229 01/13/18 1515 01/14/18 0306  WBC 11.0* 10.2 9.8 9.3 11.4*  HGB 10.8* 8.2* 7.9* 7.8*  9.2*  HCT 35.2* 26.9* 25.5* 25.4* 29.4*  MCV 85.4 85.7 84.4 84.7 84.5  PLT 329 335 310 297 253   Basic Metabolic Panel: Recent Labs  Lab 01/11/18 0351 01/12/18 0335 01/13/18 0229 01/14/18 0306  NA 140 138 136 138  K 5.1 5.6* 5.0 5.2*  CL 109 109 103 105  CO2 19* 21* 22 22  GLUCOSE 257* 159* 122* 145*  BUN 46* 49* 62* 65*  CREATININE 3.67* 3.87* 4.29* 4.09*  CALCIUM 9.3 8.4* 8.3* 8.3*   GFR: Estimated Creatinine Clearance: 13.7 mL/min (A) (by C-G formula based on SCr of 4.09 mg/dL (H)). Liver Function Tests: Recent Labs  Lab 01/11/18 0351 01/13/18 0229 01/14/18 0306  AST 23 25 22   ALT 16 16 17   ALKPHOS 109 78 76  BILITOT 0.7 0.6 1.0  PROT 7.2 5.7* 5.9*  ALBUMIN 3.4* 2.7* 2.7*   Recent Labs  Lab 01/11/18 0351  LIPASE 46   No results for input(s): AMMONIA in the last 168 hours. Coagulation Profile: No results for input(s): INR, PROTIME in the last 168 hours. Cardiac Enzymes: Recent Labs  Lab 01/11/18 1014 01/11/18 1602 01/11/18 2155 01/13/18 0229  TROPONINI 6.81* 9.71* 10.54* 5.52*   BNP (last 3 results) No results for input(s): PROBNP in the last 8760 hours. HbA1C: No results for input(s): HGBA1C in the last 72 hours. CBG: Recent Labs  Lab 01/13/18 1209 01/13/18 1743 01/13/18 2134 01/14/18 0600 01/14/18 1229  GLUCAP 118* 148* 161* 122* 164*   Lipid Profile: Recent Labs    01/12/18 0335  CHOL 179  HDL 43  LDLCALC 118*  TRIG 89  CHOLHDL 4.2   Thyroid Function Tests: No results for input(s): TSH, T4TOTAL, FREET4, T3FREE, THYROIDAB in the last 72 hours. Anemia Panel: Recent Labs    01/14/18 0306  VITAMINB12 325  FOLATE 11.3  FERRITIN 135  TIBC 273  IRON 86  RETICCTPCT 1.3   Sepsis Labs: No results for input(s): PROCALCITON, LATICACIDVEN in the last 168 hours.  No results found for this or any previous visit (from the past 240 hour(s)).       Radiology Studies: No results found.      Scheduled Meds: . amLODipine  5 mg  Oral Daily  . aspirin  81 mg Oral Daily  . atorvastatin  80 mg Oral q1800  . carvedilol  18.75 mg Oral BID WC  . insulin aspart  0-15 Units Subcutaneous TID WC  . insulin aspart  0-5 Units Subcutaneous QHS  . insulin glargine  25 Units Subcutaneous BID  . isosorbide mononitrate  30 mg Oral BID  . ticagrelor  90 mg Oral BID   Continuous Infusions: . sodium chloride    . sodium chloride       LOS: 3 days    Time spent: 25  mins.More than 50% of that time was spent in counseling and/or coordination of care.      Shelly Coss, MD [facility] Pager [phone]  If 7PM-7AM, please contact night-coverage [email or web address] Password [email or web address] 01/14/2018, 1:58 PM

## 2018-01-14 NOTE — Progress Notes (Signed)
Progress Note  Patient Name: Jenna Brown Date of Encounter: 01/14/2018  Primary Cardiologist: Glori Bickers, MD   Subjective   Feeling better off IV NTG and heparin.  No pain since the admission night.  Inpatient Medications    Scheduled Meds: . amLODipine  5 mg Oral Daily  . aspirin  81 mg Oral Daily  . atorvastatin  80 mg Oral q1800  . carvedilol  18.75 mg Oral BID WC  . insulin aspart  0-15 Units Subcutaneous TID WC  . insulin aspart  0-5 Units Subcutaneous QHS  . insulin glargine  25 Units Subcutaneous BID  . isosorbide mononitrate  30 mg Oral Daily  . ticagrelor  90 mg Oral BID   Continuous Infusions: . sodium chloride    . sodium chloride     PRN Meds: ALPRAZolam, calcium carbonate (dosed in mg elemental calcium), camphor-menthol **AND** hydrOXYzine, docusate sodium, feeding supplement (NEPRO CARB STEADY), hydrALAZINE, morphine injection, ondansetron **OR** ondansetron (ZOFRAN) IV, sorbitol, zolpidem   Vital Signs    Vitals:   01/13/18 2315 01/14/18 0200 01/14/18 0445 01/14/18 0832  BP: (!) 128/55 113/72 (!) 145/59 (!) 137/58  Pulse: 79 78 81 71  Resp:   17   Temp: 98.4 F (36.9 C) (!) 97.3 F (36.3 C) 98.1 F (36.7 C)   TempSrc: Oral Oral Oral   SpO2: 95% 98% 98%   Weight:   162 lb 12.8 oz (73.8 kg)   Height:        Intake/Output Summary (Last 24 hours) at 01/14/2018 1030 Last data filed at 01/13/2018 2000 Gross per 24 hour  Intake 667.33 ml  Output -  Net 667.33 ml   Filed Weights   01/12/18 0400 01/13/18 0410 01/14/18 0445  Weight: 160 lb 0.9 oz (72.6 kg) 172 lb (78 kg) 162 lb 12.8 oz (73.8 kg)   Telemetry    SR  - Personally Reviewed  ECG    SR with inf, lat ST  Depression and Ant T wave changes - Personally Reviewed  Physical Exam   GEN: No acute distress.   Neck: No JVD Cardiac: RRR, no murmurs, rubs, or gallops.  Respiratory: Clear to auscultation bilaterally. No rales notes  GI: Soft, nontender, non-distended  MS: No edema;  No deformity. Neuro:  Nonfocal  Psych: Normal affect   Labs    Chemistry Recent Labs  Lab 01/11/18 0351 01/12/18 0335 01/13/18 0229 01/14/18 0306  NA 140 138 136 138  K 5.1 5.6* 5.0 5.2*  CL 109 109 103 105  CO2 19* 21* 22 22  GLUCOSE 257* 159* 122* 145*  BUN 46* 49* 62* 65*  CREATININE 3.67* 3.87* 4.29* 4.09*  CALCIUM 9.3 8.4* 8.3* 8.3*  PROT 7.2  --  5.7* 5.9*  ALBUMIN 3.4*  --  2.7* 2.7*  AST 23  --  25 22  ALT 16  --  16 17  ALKPHOS 109  --  78 76  BILITOT 0.7  --  0.6 1.0  GFRNONAA 12* 11* 10* 11*  GFRAA 14* 13* 12* 12*  ANIONGAP 12 8 11 11     Hematology Recent Labs  Lab 01/13/18 0229 01/13/18 1515 01/14/18 0306  WBC 9.8 9.3 11.4*  RBC 3.02* 3.00* 3.48*  3.48*  HGB 7.9* 7.8* 9.2*  HCT 25.5* 25.4* 29.4*  MCV 84.4 84.7 84.5  MCH 26.2 26.0 26.4  MCHC 31.0 30.7 31.3  RDW 12.9 12.8 13.2  PLT 310 297 282   Cardiac Enzymes Recent Labs  Lab 01/11/18 1014  01/11/18 1602 01/11/18 2155 01/13/18 0229  TROPONINI 6.81* 9.71* 10.54* 5.52*    Recent Labs  Lab 01/11/18 0440  TROPIPOC 0.22*    BNP Recent Labs  Lab 01/11/18 0357  BNP 61.2    DDimer No results for input(s): DDIMER in the last 168 hours.   Radiology    US Renal  Result Date: 01/12/2018 CLINICAL DATA:  Acute renal insufficiency, hypertension, diabetes, chronic kidney disease EXAM: RENAL / URINARY TRACT ULTRASOUND COMPLETE COMPARISON:  Ultrasound the abdomen of 12/26/2016 FINDINGS: Right Kidney: Length: 11.4 cm. No hydronephrosis is seen. There is a complex cyst present in the upper pole of 1.7 cm in diameter. The echogenicity of the right renal parenchyma does appear to be increased consistent with chronic renal medical disease. Left Kidney: Length: 10.5 cm. No hydronephrosis is seen. The echogenicity of the left kidney also is somewhat increased. Bladder: The urinary bladder is not well distended but no abnormality is seen. IMPRESSION: 1. No hydronephrosis. 2. Somewhat echogenic renal  parenchyma consistent with chronic renal medical disease. Electronically Signed   By: Ivar Drape M.D.   On: 01/12/2018 12:59    Cardiac Studies   Echo Pending  RHC/LHC 04/14/15  Prox LAD to Mid LAD lesion, 90% stenosed. Diffuse disease  Ost Cx to Mid Cx lesion, 95% stenosed.  Ost 2nd Mrg to 2nd Mrg lesion, 80% stenosed.  Mid Cx lesion, 90% stenosed.  Prox RCA lesion, 60% stenosed.  Mid RCA lesion, 50% stenosed.   Patient Profile     67 y.o. female with a hx of DM2, HTN, HLD, CVA, R-AKA, known severe CAD rx medically due to poor renal function (not CABG candidate), now admitted with chest pain.    Assessment & Plan    NSTEMI with troponin pk of 10.54 -->5.5 --IV NTG and IV heparin is feeling better, troponin trending down, I will d/c both, start imdur 30 mg po daily -- Not candidate for CABG per Dr Prescott Gum due to comorbidities and poor targets.  -- on bb and now lipitor to 80 and on ASA and Brilinta at home is on Imdur 60 in AM and 30 in the evening, currently on 30 daily, I will add 30 mg po in the evening  Chronic combined systolic and diastolic HF  --mildly decreased EF G1DD 45-50%  --euvolemic today  --Lasix 40 mg daily currently on hold due to increase of Cr same for cozaar   AKI Cr increased to 4.29   Her usual is 2.72  HTN --controlled today to low   PAD s/p R AKA    DM-2 per IM  HLD continue statin  The patient is asymptomatic, chest pain free, BP better controlled, she can be discharged today, I would continue to hold ARB and lasix, there are no signs of fluid overload.   For questions or updates, please contact [place] Please consult [email or web address] for contact info under Cardiology/STEMI.   Signed, Ena Dawley, MD  01/14/2018, 10:30 AM

## 2018-01-14 NOTE — Plan of Care (Signed)

## 2018-01-15 LAB — BASIC METABOLIC PANEL
ANION GAP: 7 (ref 5–15)
BUN: 62 mg/dL — ABNORMAL HIGH (ref 6–20)
CHLORIDE: 111 mmol/L (ref 101–111)
CO2: 21 mmol/L — AB (ref 22–32)
Calcium: 8.1 mg/dL — ABNORMAL LOW (ref 8.9–10.3)
Creatinine, Ser: 3.85 mg/dL — ABNORMAL HIGH (ref 0.44–1.00)
GFR calc non Af Amer: 11 mL/min — ABNORMAL LOW (ref >60)
GFR, EST AFRICAN AMERICAN: 13 mL/min — AB (ref >60)
GLUCOSE: 139 mg/dL — AB (ref 65–99)
POTASSIUM: 4.9 mmol/L (ref 3.5–5.1)
Sodium: 139 mmol/L (ref 135–145)

## 2018-01-15 LAB — GLUCOSE, CAPILLARY
Glucose-Capillary: 74 mg/dL (ref 65–99)
Glucose-Capillary: 157 mg/dL — ABNORMAL HIGH (ref 65–99)

## 2018-01-15 MED ORDER — CARVEDILOL 6.25 MG PO TABS: 18.7500 mg | ORAL_TABLET | Freq: Two times a day (BID) | ORAL | 0 refills | Status: DC

## 2018-01-15 MED ORDER — ISOSORBIDE MONONITRATE ER 30 MG PO TB24: 30.0000 mg | ORAL_TABLET | Freq: Two times a day (BID) | ORAL | 0 refills | Status: DC

## 2018-01-15 NOTE — Progress Notes (Signed)
Patient blood sugar 74; Gave patient graham cracker until breakfast try arrives; Will continue to monitor and notified on coming RN.

## 2018-01-15 NOTE — Progress Notes (Signed)
Progress Note  Patient Name: Jenna Brown Date of Encounter: 01/15/2018  Primary Cardiologist: Glori Bickers, MD   Subjective   Feeling better , no more chest pain even after discontinuation of iv NTG and Heparin. .  Inpatient Medications    Scheduled Meds: . amLODipine  5 mg Oral Daily  . aspirin  81 mg Oral Daily  . atorvastatin  80 mg Oral q1800  . carvedilol  18.75 mg Oral BID WC  . heparin injection (subcutaneous)  5,000 Units Subcutaneous Q8H  . insulin aspart  0-15 Units Subcutaneous TID WC  . insulin aspart  0-5 Units Subcutaneous QHS  . insulin glargine  25 Units Subcutaneous BID  . isosorbide mononitrate  30 mg Oral BID  . ticagrelor  90 mg Oral BID   Continuous Infusions: . sodium chloride    . sodium chloride 75 mL/hr at 01/14/18 2257   PRN Meds: ALPRAZolam, calcium carbonate (dosed in mg elemental calcium), camphor-menthol **AND** hydrOXYzine, docusate sodium, feeding supplement (NEPRO CARB STEADY), hydrALAZINE, morphine injection, ondansetron **OR** ondansetron (ZOFRAN) IV, sorbitol, zolpidem   Vital Signs    Vitals:   01/14/18 1728 01/14/18 2005 01/15/18 0318 01/15/18 0815  BP: (!) 133/105 138/62 (!) 156/86 139/70  Pulse: 69 82 82 77  Resp:      Temp:  98.6 F (37 C) 98.5 F (36.9 C)   TempSrc:  Oral Oral   SpO2:      Weight:   165 lb 4.8 oz (75 kg)   Height:        Intake/Output Summary (Last 24 hours) at 01/15/2018 1052 Last data filed at 01/14/2018 2007 Gross per 24 hour  Intake 222 ml  Output -  Net 222 ml   Filed Weights   01/13/18 0410 01/14/18 0445 01/15/18 0318  Weight: 172 lb (78 kg) 162 lb 12.8 oz (73.8 kg) 165 lb 4.8 oz (75 kg)   Telemetry    SR  - Personally Reviewed  ECG    SR with inf, lat ST  Depression and Ant T wave changes - Personally Reviewed  Physical Exam   GEN: No acute distress.   Neck: No JVD Cardiac: RRR, no murmurs, rubs, or gallops.  Respiratory: Clear to auscultation bilaterally. No rales notes    GI: Soft, nontender, non-distended  MS: No edema; No deformity. Neuro:  Nonfocal  Psych: Normal affect   Labs    Chemistry Recent Labs  Lab 01/11/18 0351  01/13/18 0229 01/14/18 0306 01/15/18 0259  NA 140   < > 136 138 139  K 5.1   < > 5.0 5.2* 4.9  CL 109   < > 103 105 111  CO2 19*   < > 22 22 21*  GLUCOSE 257*   < > 122* 145* 139*  BUN 46*   < > 62* 65* 62*  CREATININE 3.67*   < > 4.29* 4.09* 3.85*  CALCIUM 9.3   < > 8.3* 8.3* 8.1*  PROT 7.2  --  5.7* 5.9*  --   ALBUMIN 3.4*  --  2.7* 2.7*  --   AST 23  --  25 22  --   ALT 16  --  16 17  --   ALKPHOS 109  --  78 76  --   BILITOT 0.7  --  0.6 1.0  --   GFRNONAA 12*   < > 10* 11* 11*  GFRAA 14*   < > 12* 12* 13*  ANIONGAP 12   < >  11 11 7    < > = values in this interval not displayed.    Hematology Recent Labs  Lab 01/13/18 0229 01/13/18 1515 01/14/18 0306  WBC 9.8 9.3 11.4*  RBC 3.02* 3.00* 3.48*  3.48*  HGB 7.9* 7.8* 9.2*  HCT 25.5* 25.4* 29.4*  MCV 84.4 84.7 84.5  MCH 26.2 26.0 26.4  MCHC 31.0 30.7 31.3  RDW 12.9 12.8 13.2  PLT 310 297 282   Cardiac Enzymes Recent Labs  Lab 01/11/18 1014 01/11/18 1602 01/11/18 2155 01/13/18 0229  TROPONINI 6.81* 9.71* 10.54* 5.52*    Recent Labs  Lab 01/11/18 0440  TROPIPOC 0.22*    BNP Recent Labs  Lab 01/11/18 0357  BNP 61.2    DDimer No results for input(s): DDIMER in the last 168 hours.   Radiology    No results found.  Cardiac Studies   Echo Pending  RHC/LHC 04/14/15  Prox LAD to Mid LAD lesion, 90% stenosed. Diffuse disease  Ost Cx to Mid Cx lesion, 95% stenosed.  Ost 2nd Mrg to 2nd Mrg lesion, 80% stenosed.  Mid Cx lesion, 90% stenosed.  Prox RCA lesion, 60% stenosed.  Mid RCA lesion, 50% stenosed.   Patient Profile     67 y.o. female with a hx of DM2, HTN, HLD, CVA, R-AKA, known severe CAD rx medically due to poor renal function (not CABG candidate), now admitted with chest pain.    Assessment & Plan    NSTEMI with  troponin pk of 10.54 -->5.5 -- Not candidate for CABG per Dr Prescott Gum due to comorbidities and poor targets.  -- on bb and now lipitor to 80 and on ASA and Brilinta at home is on Imdur 60 in AM and 30 in the evening, currently on 30 mg po BID, chest pain free  She can be discharged today, we will arrange for an outpatient cardiology follow up.  Chronic combined systolic and diastolic HF  --mildly decreased EF G1DD 45-50%  --euvolemic today  --Lasix 40 mg daily currently on hold due to increase of Cr same for cozaar, I would continue to hold lasix and reassess the need at the next outpatient visit  AKI Cr increased to 4.29-->3.85,   Her usual is 2.72, she will need to follow with nephrology  HTN --controlled today to low   PAD s/p R AKA    DM-2 per IM  HLD continue statin  The patient is asymptomatic, chest pain free, BP better controlled, she can be discharged today, I would continue to hold ARB and lasix, there are no signs of fluid overload.   For questions or updates, please contact [place] Please consult [email or web address] for contact info under Cardiology/STEMI.   Signed, Ena Dawley, MD  01/15/2018, 10:52 AM

## 2018-01-15 NOTE — Care Management Important Message (Signed)
Important Message  Patient Details  Name: Jenna Brown MRN: [id number] Date of Birth: Jan 09, 1951   Medicare Important Message Given:  Yes    Elwyn Klosinski P Taylin Mans 01/15/2018, 3:03 PM

## 2018-01-15 NOTE — Discharge Summary (Signed)
Physician Discharge Summary  Jenna Brown PPI:[id number] DOB: 1951-09-02 DOA: 01/11/2018  PCP: [name], MD  Admit date: 01/11/2018 Discharge date: 01/15/2018  Admitted From: Home Disposition:  Home  Discharge Condition:Stable CODE STATUS:FULL Diet recommendation: Heart Healthy   Brief/Interim Summary: Patient is a 67 year old female with past medical history of diabetes, TIA, PVD, multivessel coronary artery disease, hypertension, right AKA, hyperlipidemia who presented with chest pain.  Patient was currently  managed for NSTEMI.  Cardiology was following.  Currently sees chest pain-free.  Cardiology signed off and cleared for discharge.  Her medications have been optimized. She also was found to have acute kidney injury on her CKD.  We will hold Lasix and losartan on discharge.  She will follow-up with her nephrologist as an outpatient.  Following problems were addressed during her hospitalization:   NSTEMI: Denies any chest pain.  She has multivessel coronary disease. Currently medically managed.  Cardiology was following.  Heparin drip stopped.  Started on Imdur.  Not a candidate for CABG. Continue aspirin and Brilinta.  Continue beta-blocker. Follow-up with her cardiologist as an outpatient.  Hypertension: Currently blood pressure stable.    Lasix and losartan will be on hold because of the acute kidney injury.   Acute kidney injury on CKD stage IV: Creatinine 2.72 on August 2018. Creatinine was in the range of 4.    Kidney function improved with gentle IV fluid. Renal ultrasound   consistent with chronic renal medical disease.  Diabetes mellitus with renal complication: She is on insulin at home.  Continue her home regimen.  Hyperlipidemia: Continue statin  Anemia: Currently H and H stable.  Chronic diastolic and systolic CHF: Echocardiogram done in July 2018 showed EF of 40-45% with grade 1 diastolic dysfunction.  Currently compensated.  Lasix on  hold.  History of peripheral artery disease: Status post right AKA.  Continue dual antiplatelet therapy, statin.   Discharge Diagnoses:  Principal Problem:   NSTEMI (non-ST elevated myocardial infarction) ([place]) Active Problems:   Essential hypertension   HLD (hyperlipidemia)   Chronic systolic heart failure (HCC)   CAD in native artery   Chronic kidney disease (CKD), stage IV (severe) ([place])   Insulin-requiring or dependent type II diabetes mellitus ([place])   Goals of care, counseling/discussion   Palliative care by specialist   Chronic kidney disease, stage V ([place])   Acute kidney failure [facility])    Discharge Instructions  Discharge Instructions    Diet - low sodium heart healthy   Complete by:  As directed    Discharge instructions   Complete by:  As directed    1) Follow up with your PCP in a week.  Do a CBC and BMP test during the follow-up. 2) Follow up with your cardiologist in 2 weeks.  Name and number of the provider has been attached. 3) Follow up with your nephrologist in a week.  Name and number of the provider has been attached. 4) Hold Lasix and losartan until you do the BMP test. 5) Take prescribed medications as instructed.   Increase activity slowly   Complete by:  As directed      Allergies as of 01/15/2018      Reactions   Plavix [clopidogrel Bisulfate] Itching   Codeine Other (See Comments)   "makes me feel strange"   Tylenol With Codeine #3 [acetaminophen-codeine] Other (See Comments)   "doesn't make me feel right"      Medication List    STOP taking these medications   hydrALAZINE 50  MG tablet Commonly known as:  APRESOLINE   losartan 50 MG tablet Commonly known as:  COZAAR     TAKE these medications   acetaminophen 650 MG CR tablet Commonly known as:  TYLENOL Take 650 mg by mouth every 8 (eight) hours as needed for pain.   ALPRAZolam 0.25 MG tablet Commonly known as:  XANAX Take 1 tablet (0.25 mg total) by mouth 3 (three) times daily as  needed for anxiety.   amLODipine 5 MG tablet Commonly known as:  NORVASC Take 5 mg by mouth daily.   aspirin 81 MG chewable tablet Chew 81 mg by mouth daily.   atorvastatin 80 MG tablet Commonly known as:  LIPITOR Take 80 mg by mouth at bedtime.   carvedilol 6.25 MG tablet Commonly known as:  COREG Take 3 tablets (18.75 mg total) by mouth 2 (two) times daily with a meal. What changed:    medication strength  how much to take   cholecalciferol 1000 units tablet Commonly known as:  VITAMIN D Take 1,000 Units by mouth daily.   furosemide 40 MG tablet Commonly known as:  LASIX Take 1 tablet (40 mg total) by mouth daily.   insulin glargine 100 UNIT/ML injection Commonly known as:  LANTUS Inject 0.25 mLs (25 Units total) into the skin 2 (two) times daily. What changed:  when to take this   insulin lispro 100 UNIT/ML injection Commonly known as:  HUMALOG Inject 4 Units into the skin See admin instructions. Below 110 do not take it. 110-115 and above take 4 units   isosorbide mononitrate 30 MG 24 hr tablet Commonly known as:  IMDUR Take 1 tablet (30 mg total) by mouth 2 (two) times daily. What changed:    how much to take  how to take this  when to take this  additional instructions   nitroGLYCERIN 0.4 MG SL tablet Commonly known as:  NITROSTAT Place 1 tablet (0.4 mg total) under the tongue every 5 (five) minutes x 3 doses as needed for chest pain.   ticagrelor 90 MG Tabs tablet Commonly known as:  BRILINTA Take 1 tablet (90 mg total) by mouth 2 (two) times daily.      Follow-up Information    [name], [name], MD. Schedule an appointment as soon as possible for a visit in 1 week(s).   Specialty:  Internal Medicine Contact information: [place] [address] [phone]        Bensimhon, Shaune Pascal, MD. Schedule an appointment as soon as possible for a visit in 2 week(s).   Specialty:  Cardiology Contact information: [address] [place] [address] [phone]        Cay Schillings, MD. Schedule an appointment as soon as possible for a visit in 1 week(s).   Specialty:  Unknown Physician Specialty Contact information: [address] [place] [address] [phone]          Allergies  Allergen Reactions  . Plavix [Clopidogrel Bisulfate] Itching  . Codeine Other (See Comments)    "makes me feel strange"  . Tylenol With Codeine #3 [Acetaminophen-Codeine] Other (See Comments)    "doesn't make me feel right"    Consultations: Cardiology  Procedures/Studies: Dg Chest 2 View  Result Date: 01/11/2018 CLINICAL DATA:  Chest pain, nausea, hypertensive. EXAM: CHEST - 2 VIEW COMPARISON:  Chest radiograph March 23, 2017 FINDINGS: Cardiomediastinal silhouette is unremarkable for this low inspiratory examination with crowded vasculature markings. The lungs are clear  without pleural effusions or focal consolidations. Persistently elevated RIGHT hemidiaphragm. Trachea projects midline and there is no pneumothorax. Included soft tissue planes and osseous structures are non-suspicious. IMPRESSION: No active cardiopulmonary disease. Electronically Signed   By: Elon Alas M.D.   On: 01/11/2018 04:56   US Renal  Result Date: 01/12/2018 CLINICAL DATA:  Acute renal insufficiency, hypertension, diabetes, chronic kidney disease EXAM: RENAL / URINARY TRACT ULTRASOUND COMPLETE COMPARISON:  Ultrasound the abdomen of 12/26/2016 FINDINGS: Right Kidney: Length: 11.4 cm. No hydronephrosis is seen. There is a complex cyst present in the upper pole of 1.7 cm in diameter. The echogenicity of the right renal parenchyma does appear to be increased consistent with chronic renal medical disease. Left Kidney: Length: 10.5 cm. No hydronephrosis is seen. The echogenicity of the left kidney also is somewhat increased. Bladder: The urinary bladder is not well distended but no abnormality is seen. IMPRESSION: 1.  No hydronephrosis. 2. Somewhat echogenic renal parenchyma consistent with chronic renal medical disease. Electronically Signed   By: Ivar Drape M.D.   On: 01/12/2018 12:59       Subjective: Patient seen and examined the bedside this morning.  Remains comfortable.  Stable for discharge to home today.  Discharge Exam: Vitals:   01/15/18 0318 01/15/18 0815  BP: (!) 156/86 139/70  Pulse: 82 77  Resp:    Temp: 98.5 F (36.9 C)   SpO2:     Vitals:   01/14/18 1728 01/14/18 2005 01/15/18 0318 01/15/18 0815  BP: (!) 133/105 138/62 (!) 156/86 139/70  Pulse: 69 82 82 77  Resp:      Temp:  98.6 F (37 C) 98.5 F (36.9 C)   TempSrc:  Oral Oral   SpO2:      Weight:   75 kg (165 lb 4.8 oz)   Height:        General: Pt is alert, awake, not in acute distress Cardiovascular: RRR, S1/S2 +, no rubs, no gallops Respiratory: CTA bilaterally, no wheezing, no rhonchi Abdominal: Soft, NT, ND, bowel sounds + Extremities: no edema, no cyanosis,right AKA    The results of significant diagnostics from this hospitalization (including imaging, microbiology, ancillary and laboratory) are listed below for reference.     Microbiology: No results found for this or any previous visit (from the past 240 hour(s)).   Labs: BNP (last 3 results) Recent Labs    01/11/18 0357  BNP 12.8   Basic Metabolic Panel: Recent Labs  Lab 01/11/18 0351 01/12/18 0335 01/13/18 0229 01/14/18 0306 01/15/18 0259  NA 140 138 136 138 139  K 5.1 5.6* 5.0 5.2* 4.9  CL 109 109 103 105 111  CO2 19* 21* 22 22 21*  GLUCOSE 257* 159* 122* 145* 139*  BUN 46* 49* 62* 65* 62*  CREATININE 3.67* 3.87* 4.29* 4.09* 3.85*  CALCIUM 9.3 8.4* 8.3* 8.3* 8.1*   Liver Function Tests: Recent Labs  Lab 01/11/18 0351 01/13/18 0229 01/14/18 0306  AST 23 25 22   ALT 16 16 17   ALKPHOS 109 78 76  BILITOT 0.7 0.6 1.0  PROT 7.2 5.7* 5.9*  ALBUMIN 3.4* 2.7* 2.7*   Recent Labs  Lab 01/11/18 0351  LIPASE 46   No results  for input(s): AMMONIA in the last 168 hours. CBC: Recent Labs  Lab 01/11/18 0351 01/12/18 0335 01/13/18 0229 01/13/18 1515 01/14/18 0306  WBC 11.0* 10.2 9.8 9.3 11.4*  HGB 10.8* 8.2* 7.9* 7.8* 9.2*  HCT 35.2* 26.9* 25.5* 25.4* 29.4*  MCV 85.4 85.7 84.4  84.7 84.5  PLT 329 335 310 297 282   Cardiac Enzymes: Recent Labs  Lab 01/11/18 1014 01/11/18 1602 01/11/18 2155 01/13/18 0229  TROPONINI 6.81* 9.71* 10.54* 5.52*   BNP: Invalid input(s): POCBNP CBG: Recent Labs  Lab 01/14/18 0600 01/14/18 1229 01/14/18 1707 01/14/18 2115 01/15/18 0611  GLUCAP 122* 164* 264* 118* 74   D-Dimer No results for input(s): DDIMER in the last 72 hours. Hgb A1c No results for input(s): HGBA1C in the last 72 hours. Lipid Profile No results for input(s): CHOL, HDL, LDLCALC, TRIG, CHOLHDL, LDLDIRECT in the last 72 hours. Thyroid function studies No results for input(s): TSH, T4TOTAL, T3FREE, THYROIDAB in the last 72 hours.  Invalid input(s): FREET3 Anemia work up Recent Labs    01/14/18 0306  VITAMINB12 325  FOLATE 11.3  FERRITIN 135  TIBC 273  IRON 86  RETICCTPCT 1.3   Urinalysis    Component Value Date/Time   COLORURINE STRAW (A) 03/28/2017 2151   APPEARANCEUR CLEAR 03/28/2017 2151   LABSPEC 1.008 03/28/2017 2151   PHURINE 6.0 03/28/2017 2151   GLUCOSEU 50 (A) 03/28/2017 2151   HGBUR NEGATIVE 03/28/2017 2151   BILIRUBINUR NEGATIVE 03/28/2017 2151   [place] NEGATIVE 03/28/2017 2151   PROTEINUR 100 (A) 03/28/2017 2151   UROBILINOGEN 0.2 04/10/2015 1248   NITRITE NEGATIVE 03/28/2017 2151   LEUKOCYTESUR NEGATIVE 03/28/2017 2151   Sepsis Labs Invalid input(s): PROCALCITONIN,  WBC,  LACTICIDVEN Microbiology No results found for this or any previous visit (from the past 240 hour(s)).   Time coordinating discharge: 35 minutes  SIGNED:   Shelly Coss, MD  [facility] 01/15/2018, 10:48 AM Pager [phone]  If 7PM-7AM, please contact  night-coverage [email or web address] Password [email or web address]

## 2018-01-29 ENCOUNTER — Encounter (HOSPITAL_COMMUNITY): Payer: Self-pay

## 2018-01-29 ENCOUNTER — Ambulatory Visit (HOSPITAL_COMMUNITY)
Admission: RE | Admit: 2018-01-29 | Discharge: 2018-01-29 | Disposition: A | Discharge status: Home / Self Care | Payer: Medicare Other | Source: Ambulatory Visit | Attending: Cardiology | Admitting: Cardiology

## 2018-01-29 VITALS — BP 182/88 | HR 73 | Wt 161.8 lb

## 2018-01-29 DIAGNOSIS — I255 Ischemic cardiomyopathy: Secondary | ICD-10-CM

## 2018-01-29 DIAGNOSIS — E1122 Type 2 diabetes mellitus with diabetic chronic kidney disease: Secondary | ICD-10-CM | POA: Insufficient documentation

## 2018-01-29 DIAGNOSIS — I10 Essential (primary) hypertension: Secondary | ICD-10-CM | POA: Diagnosis not present

## 2018-01-29 DIAGNOSIS — I132 Hypertensive heart and chronic kidney disease with heart failure and with stage 5 chronic kidney disease, or end stage renal disease: Secondary | ICD-10-CM | POA: Diagnosis not present

## 2018-01-29 DIAGNOSIS — N185 Chronic kidney disease, stage 5: Secondary | ICD-10-CM | POA: Diagnosis not present

## 2018-01-29 DIAGNOSIS — Z794 Long term (current) use of insulin: Secondary | ICD-10-CM | POA: Diagnosis not present

## 2018-01-29 DIAGNOSIS — Z7982 Long term (current) use of aspirin: Secondary | ICD-10-CM | POA: Insufficient documentation

## 2018-01-29 DIAGNOSIS — K219 Gastro-esophageal reflux disease without esophagitis: Secondary | ICD-10-CM | POA: Insufficient documentation

## 2018-01-29 DIAGNOSIS — N184 Chronic kidney disease, stage 4 (severe): Secondary | ICD-10-CM

## 2018-01-29 DIAGNOSIS — Z87891 Personal history of nicotine dependence: Secondary | ICD-10-CM | POA: Diagnosis not present

## 2018-01-29 DIAGNOSIS — I2510 Atherosclerotic heart disease of native coronary artery without angina pectoris: Secondary | ICD-10-CM

## 2018-01-29 DIAGNOSIS — Z8673 Personal history of transient ischemic attack (TIA), and cerebral infarction without residual deficits: Secondary | ICD-10-CM | POA: Insufficient documentation

## 2018-01-29 DIAGNOSIS — I5022 Chronic systolic (congestive) heart failure: Secondary | ICD-10-CM | POA: Insufficient documentation

## 2018-01-29 DIAGNOSIS — Z79899 Other long term (current) drug therapy: Secondary | ICD-10-CM | POA: Diagnosis not present

## 2018-01-29 LAB — BASIC METABOLIC PANEL
ANION GAP: 6 (ref 5–15)
BUN: 43 mg/dL — ABNORMAL HIGH (ref 6–20)
CALCIUM: 8.7 mg/dL — AB (ref 8.9–10.3)
CO2: 22 mmol/L (ref 22–32)
Chloride: 113 mmol/L — ABNORMAL HIGH (ref 101–111)
Creatinine, Ser: 3.14 mg/dL — ABNORMAL HIGH (ref 0.44–1.00)
GFR calc Af Amer: 17 mL/min — ABNORMAL LOW (ref >60)
GFR, EST NON AFRICAN AMERICAN: 15 mL/min — AB (ref >60)
Glucose, Bld: 98 mg/dL (ref 65–99)
POTASSIUM: 5.2 mmol/L — AB (ref 3.5–5.1)
SODIUM: 141 mmol/L (ref 135–145)

## 2018-01-29 MED ORDER — HYDRALAZINE HCL 50 MG PO TABS: 75.0000 mg | ORAL_TABLET | Freq: Three times a day (TID) | ORAL | 11 refills | Status: DC

## 2018-01-29 MED ORDER — FUROSEMIDE 40 MG PO TABS: 40.0000 mg | ORAL_TABLET | Freq: Every day | ORAL | 11 refills | Status: DC | PRN

## 2018-01-29 NOTE — Patient Instructions (Signed)
INCREASE Hydralazine to 75 mg three times daily.  Take Lasix one tablet once daily AS NEEDED for swelling/weight gain.  Routine lab work today. Will notify you of abnormal results, otherwise no news is good news!  Check BP for one week and record.  Follow up with Oda Kilts PA-C in 4 weeks.  ______________________________________________________________ Jenna Brown Code: 1300  Follow up 2-3 months with Dr. Haroldine Laws.  _______________________________________________________________ Jenna Brown Code:  Take all medication as prescribed the day of your appointment. Bring all medications with you to your appointment.  Do the following things EVERYDAY: 1) Weigh yourself in the morning before breakfast. Write it down and keep it in a log. 2) Take your medicines as prescribed 3) Eat low salt foods-Limit salt (sodium) to 2000 mg per day.  4) Stay as active as you can everyday 5) Limit all fluids for the day to less than 2 liters

## 2018-01-29 NOTE — Progress Notes (Signed)
Advanced Heart Failure Clinic Note   Patient ID: Jenna Brown, female   DOB: 02/19/1951, 67 y.o.   MRN: [id number]   PCP: Dr. Vista Lawman Primary HF Cardiologist: Dr Haroldine Laws Renal:  Hulen Shouts [phone])   HPI:  Jenna Brown is a 68 y.o. female with hyperlipidemia, CVA, hypertension, type 2 diabetes mellitus, severe CAD, right AKA .   Admitted to [facility] 04/10/15 with increased dyspnea. Had RHC/LHC as noted below. Not candidate for CABG per Dr Prescott Gum due to comorbidities and poor targets. Required short term milrinone for cardiogenic shock. As she improved and fully diuresed, milrinone stopped. She had recommendations for lasix, spiro, losartan, hydralazine, and imdur.  Discharge weight was 158 pounds.     Admitted 4/8 -> 01/03/2017 with hypertensive emergency and AKI. BP up to 225/98 Troponin max to 4.62 felt to be demand ischemia. Started on Nitro + heparin gtt. Lasix and losartan held in AKI.  Renal consulted. Medications heavily adjusted.   Admitted 03/23/17 - 03/30/17 with NSTEMI. She had not been taking her Brilinta correctly. Decision was made to treat her medically as it was felt that she was at high risk of CIN with her CKD. Her isosorbide was increased to 90 mg daily. Her chest pain improved with increased isosorbide, creatinine was as high as 3.33. Her losartan was restarted at discharge after AKI resolved.   Admitted 01/11/18 with chest pain with NSTEMI. This was worse than her typical chest pain, which she gets 1-2 times weekly. Peak troponin 10.54. Not taken for cath with CKD IV-V. Treated with IV nitro and heparin. Not candidate for CABG per Dr. Prescott Gum.  Meds adjusted and pt remained asymptomatic for the remainder of visit.   She presents today for post hospital follow up. She is feeling much better post hospital stay. She denies any recurrent chest pain. No N/V or fatigue. She is staying with her daughter but is going back home this week. She states she is off lasix and  losartan as directed, but has been taking her hydralazine, 50 mg TID. She did not take her hydralazine today. She states she is taking her Brilinta as directed. She is watching her salt and fluid. She doesn't have a working scale at home. Denies any bleeding.   Echo 12/27/16 LVEF 45-50%, diffuse hypokinesis, Grade 1 DD and ? Mass on left atrial wall. TEE 01/03/17 45%, trivial MR and TR, no LA/LAA thrombus or mass, negative bubble study.   RHC/LHC 04/14/15  Prox LAD to Mid LAD lesion, 90% stenosed. Diffuse disease  Ost Cx to Mid Cx lesion, 95% stenosed.  Ost 2nd Mrg to 2nd Mrg lesion, 80% stenosed.  Mid Cx lesion, 90% stenosed.  Prox RCA lesion, 60% stenosed.  Mid RCA lesion, 50% stenosed.   Labs 04/22/2015 K 4.5 Creatinine 1.24  Labs 05/14/2015; K 4.3  Labs 06/10/15; K 4.1, Creatinine 1.34  Review of systems complete and found to be negative unless listed in HPI.    SH:  Social History   Socioeconomic History  . Marital status: Single    Spouse name: Not on file  . Number of children: Not on file  . Years of education: Not on file  . Highest education level: Not on file  Occupational History  . Occupation: disabled  Social Needs  . Financial resource strain: Not on file  . Food insecurity:    Worry: Not on file    Inability: Not on file  . Transportation needs:    Medical: Not  on file    Non-medical: Not on file  Tobacco Use  . Smoking status: Former Smoker    Packs/day: 0.25    Years: 40.00    Pack years: 10.00    Types: Cigarettes    Last attempt to quit: 02/27/2012    Years since quitting: 5.9  . Smokeless tobacco: Never Used  Substance and Sexual Activity  . Alcohol use: No  . Drug use: No  . Sexual activity: Never    Birth control/protection: Post-menopausal  Lifestyle  . Physical activity:    Days per week: Not on file    Minutes per session: Not on file  . Stress: Not on file  Relationships  . Social connections:    Talks on phone: Not on file    Gets  together: Not on file    Attends religious service: Not on file    Active member of club or organization: Not on file    Attends meetings of clubs or organizations: Not on file    Relationship status: Not on file  . Intimate partner violence:    Fear of current or ex partner: Not on file    Emotionally abused: Not on file    Physically abused: Not on file    Forced sexual activity: Not on file  Other Topics Concern  . Not on file  Social History Narrative   She lives in [place] with family.   FH:  Family History  Problem Relation Age of Onset  . Cancer Mother        breast  . Diabetes Father     Past Medical History:  Diagnosis Date  . Anemia   . Anxiety    takes Xanax prn  . Arthritis   . CKD (chronic kidney disease), stage IV ([place])   . Coronary artery disease 2016   cath w/ 90% LAD, 95%CFX, 80% OM 2, 60% RCA, not CABG candidate, rx medically  . GERD (gastroesophageal reflux disease)   . H/O hiatal hernia   . Headache(784.0)    when b/p is elevated  . Hemorrhoids   . Hx of AKA (above knee amputation), right ([place])   . Hyperlipidemia    takes Simvastatin daily  . Hypertension    takes Amlodipine/HCTZ/Losartan daily  . Migraines   . Myocardial infarction ([place]) 1990's  . Peripheral vascular disease ([place])   . PONV (postoperative nausea and vomiting)   . Stroke [facility])    TIA history  . Type II diabetes mellitus (HCC)    on Lantus  . Vertigo    takes Ativert prn    Current Outpatient Medications  Medication Sig Dispense Refill  . acetaminophen (TYLENOL) 650 MG CR tablet Take 650 mg by mouth every 8 (eight) hours as needed for pain.     Marland Kitchen ALPRAZolam (XANAX) 0.25 MG tablet Take 1 tablet (0.25 mg total) by mouth 3 (three) times daily as needed for anxiety. 12 tablet 0  . amLODipine (NORVASC) 5 MG tablet Take 5 mg by mouth daily.    Marland Kitchen aspirin 81 MG chewable tablet Chew 81 mg by mouth daily.    Marland Kitchen atorvastatin (LIPITOR) 80 MG tablet Take 80 mg by mouth at bedtime.      . carvedilol (COREG) 6.25 MG tablet Take 3 tablets (18.75 mg total) by mouth 2 (two) times daily with a meal. 60 tablet 0  . cholecalciferol (VITAMIN D) 1000 units tablet Take 1,000 Units by mouth daily.    . insulin glargine (LANTUS) 100 UNIT/ML  injection Inject 0.25 mLs (25 Units total) into the skin 2 (two) times daily. (Patient taking differently: Inject 25 Units into the skin at bedtime. ) 10 mL 11  . insulin lispro (HUMALOG) 100 UNIT/ML injection Inject 4 Units into the skin See admin instructions. Below 110 do not take it. 110-115 and above take 4 units    . isosorbide mononitrate (IMDUR) 30 MG 24 hr tablet Take 1 tablet (30 mg total) by mouth 2 (two) times daily. 1 tablet 0  . nitroGLYCERIN (NITROSTAT) 0.4 MG SL tablet Place 1 tablet (0.4 mg total) under the tongue every 5 (five) minutes x 3 doses as needed for chest pain. 25 tablet 3  . ticagrelor (BRILINTA) 90 MG TABS tablet Take 1 tablet (90 mg total) by mouth 2 (two) times daily. 60 tablet 11  . furosemide (LASIX) 40 MG tablet Take 1 tablet (40 mg total) by mouth daily as needed. 30 tablet 11  . hydrALAZINE (APRESOLINE) 50 MG tablet Take 1.5 tablets (75 mg total) by mouth 3 (three) times daily. 135 tablet 11   No current facility-administered medications for this encounter.    Vitals:   01/29/18 1357  BP: (!) 182/88  Pulse: 73  SpO2: 100%  Weight: 161 lb 12.8 oz (73.4 kg)    Wt Readings from Last 3 Encounters:  01/29/18 161 lb 12.8 oz (73.4 kg)  01/15/18 165 lb 4.8 oz (75 kg)  03/30/17 165 lb 3.2 oz (74.9 kg)    PHYSICAL EXAM:  General: Elderly appearing. No resp difficulty. HEENT: Normal Neck: Supple. JVP 5-6. Carotids 2+ bilat; no bruits. No thyromegaly or nodule noted. Cor: PMI nondisplaced. RRR, No M/G/R noted Lungs: CTAB, normal effort. Abdomen: Soft, non-tender, non-distended, no HSM. No bruits or masses. +BS  Extremities: No cyanosis, clubbing, or rash. 1-2+ edema left ankle. S/p R BKA.  Neuro: Alert &  orientedx3, cranial nerves grossly intact. moves all 4 extremities w/o difficulty. Affect pleasant   ASSESSMENT & PLAN:  1. Chronic systolic CHF: ICM.  - Echo 03/24/17 EF 45-50%.  - NYHA III. Confounded by deconditioning and immobility due to RKA.  - Volume status mildly elevated on exam.   - Lasix on hold with AKI. Should take AS NEEDED until she sees renal.  - Continue Coreg 12.5 mg BID - Increase hydralazine to 75 mg TID.  - Continue Imdur 90 mg daily. (2 tabs in the am and one tab in the pm).  - Off Losartan on hold with AKI.  - Encouraged medication compliance.   2.CAD - LHC 2016 with prox to mid 90% LAD lesion, ostial LCx 95% stenosed. Prox RCA 60%  - Continue ASA and Brilinta.  - Continue lipitor.   3. PAD s/p R AKA - Stable.   4. DMII  - Follows with PCP.   5. TIA  - No recent symptoms.  - Continue statin and ASA.   6. CKD Stage IV-V - Sees Dr. Joesph July in [facility]. BMET today.  - Needs follow up ASAP.   7. HTN - Meds as above.   Meds and labs as above. RTC 4 weeks. Sooner with symptoms. Discussed personally with Dr. Haroldine Laws. Not candidate for CABG or PCI.   [name], PA-C  1:26 PM   Greater than 50% of the 25 minute visit was spent in counseling/coordination of care regarding disease state education, salt/fluid restriction, sliding scale diuretics, and medication compliance.

## 2018-01-30 ENCOUNTER — Telehealth (HOSPITAL_COMMUNITY): Payer: Self-pay | Admitting: *Deleted

## 2018-01-30 NOTE — Telephone Encounter (Signed)
Result Notes for Basic metabolic panel   Notes recorded by Darron Doom, RN on 01/30/2018 at 9:33 AM EDT Called and spoke with patient, she isn't taking any potassium supplements. I spoke with her about high potassium foods and she stated she hasn't been eating any but she will continue to avoid them. She verbalized that she was calling her kidney doctor tomorrow when she gets back home to schedule follow up. No further questions. ------  Notes recorded by [name], PA-C on 01/30/2018 at 7:32 AM EDT Please call today.    Legrand Como [address] [place], PA-C 01/30/2018 7:31 AM ------  Notes recorded by [name], PA-C on 01/29/2018 at 3:32 PM EDT Please make sure she is NOT taking any potassium supplementation. Avoid high K foods. Stress importance of renal follow up.    Legrand Como [address] [place], PA-C 01/29/2018 3:32 PM

## 2018-02-16 DIAGNOSIS — I119 Hypertensive heart disease without heart failure: Secondary | ICD-10-CM | POA: Diagnosis not present

## 2018-02-16 DIAGNOSIS — E119 Type 2 diabetes mellitus without complications: Secondary | ICD-10-CM | POA: Diagnosis not present

## 2018-02-16 DIAGNOSIS — E538 Deficiency of other specified B group vitamins: Secondary | ICD-10-CM | POA: Diagnosis not present

## 2018-02-16 DIAGNOSIS — E559 Vitamin D deficiency, unspecified: Secondary | ICD-10-CM | POA: Diagnosis not present

## 2018-02-16 DIAGNOSIS — D638 Anemia in other chronic diseases classified elsewhere: Secondary | ICD-10-CM | POA: Diagnosis not present

## 2018-02-16 DIAGNOSIS — I10 Essential (primary) hypertension: Secondary | ICD-10-CM | POA: Diagnosis not present

## 2018-02-16 DIAGNOSIS — R1013 Epigastric pain: Secondary | ICD-10-CM | POA: Diagnosis not present

## 2018-02-16 DIAGNOSIS — Z89611 Acquired absence of right leg above knee: Secondary | ICD-10-CM | POA: Diagnosis not present

## 2018-02-16 DIAGNOSIS — E785 Hyperlipidemia, unspecified: Secondary | ICD-10-CM | POA: Diagnosis not present

## 2018-02-16 DIAGNOSIS — N183 Chronic kidney disease, stage 3 (moderate): Secondary | ICD-10-CM | POA: Diagnosis not present

## 2018-02-27 ENCOUNTER — Encounter (HOSPITAL_COMMUNITY): Payer: Self-pay

## 2018-02-27 ENCOUNTER — Ambulatory Visit (HOSPITAL_COMMUNITY)
Admission: RE | Admit: 2018-02-27 | Discharge: 2018-02-27 | Disposition: A | Discharge status: Home / Self Care | Payer: Medicare Other | Source: Ambulatory Visit | Attending: Cardiology | Admitting: Cardiology

## 2018-02-27 VITALS — BP 148/86 | HR 64 | Wt 159.0 lb

## 2018-02-27 DIAGNOSIS — I5022 Chronic systolic (congestive) heart failure: Secondary | ICD-10-CM

## 2018-02-27 DIAGNOSIS — E1122 Type 2 diabetes mellitus with diabetic chronic kidney disease: Secondary | ICD-10-CM | POA: Insufficient documentation

## 2018-02-27 DIAGNOSIS — K219 Gastro-esophageal reflux disease without esophagitis: Secondary | ICD-10-CM | POA: Diagnosis not present

## 2018-02-27 DIAGNOSIS — I255 Ischemic cardiomyopathy: Secondary | ICD-10-CM | POA: Diagnosis not present

## 2018-02-27 DIAGNOSIS — F419 Anxiety disorder, unspecified: Secondary | ICD-10-CM | POA: Diagnosis not present

## 2018-02-27 DIAGNOSIS — I132 Hypertensive heart and chronic kidney disease with heart failure and with stage 5 chronic kidney disease, or end stage renal disease: Secondary | ICD-10-CM | POA: Insufficient documentation

## 2018-02-27 DIAGNOSIS — E1151 Type 2 diabetes mellitus with diabetic peripheral angiopathy without gangrene: Secondary | ICD-10-CM | POA: Insufficient documentation

## 2018-02-27 DIAGNOSIS — Z87891 Personal history of nicotine dependence: Secondary | ICD-10-CM | POA: Diagnosis not present

## 2018-02-27 DIAGNOSIS — Z7982 Long term (current) use of aspirin: Secondary | ICD-10-CM | POA: Diagnosis not present

## 2018-02-27 DIAGNOSIS — N184 Chronic kidney disease, stage 4 (severe): Secondary | ICD-10-CM | POA: Diagnosis not present

## 2018-02-27 DIAGNOSIS — Z8673 Personal history of transient ischemic attack (TIA), and cerebral infarction without residual deficits: Secondary | ICD-10-CM | POA: Insufficient documentation

## 2018-02-27 DIAGNOSIS — Z79899 Other long term (current) drug therapy: Secondary | ICD-10-CM | POA: Diagnosis not present

## 2018-02-27 DIAGNOSIS — I10 Essential (primary) hypertension: Secondary | ICD-10-CM | POA: Diagnosis not present

## 2018-02-27 DIAGNOSIS — N185 Chronic kidney disease, stage 5: Secondary | ICD-10-CM | POA: Diagnosis not present

## 2018-02-27 DIAGNOSIS — I2510 Atherosclerotic heart disease of native coronary artery without angina pectoris: Secondary | ICD-10-CM

## 2018-02-27 DIAGNOSIS — Z794 Long term (current) use of insulin: Secondary | ICD-10-CM | POA: Insufficient documentation

## 2018-02-27 DIAGNOSIS — I739 Peripheral vascular disease, unspecified: Secondary | ICD-10-CM | POA: Diagnosis not present

## 2018-02-27 DIAGNOSIS — Z89611 Acquired absence of right leg above knee: Secondary | ICD-10-CM | POA: Diagnosis not present

## 2018-02-27 LAB — BASIC METABOLIC PANEL
Anion gap: 6 (ref 5–15)
BUN: 51 mg/dL — AB (ref 6–20)
CHLORIDE: 115 mmol/L — AB (ref 101–111)
CO2: 24 mmol/L (ref 22–32)
CREATININE: 3.6 mg/dL — AB (ref 0.44–1.00)
Calcium: 8.7 mg/dL — ABNORMAL LOW (ref 8.9–10.3)
GFR calc Af Amer: 14 mL/min — ABNORMAL LOW (ref >60)
GFR calc non Af Amer: 12 mL/min — ABNORMAL LOW (ref >60)
GLUCOSE: 77 mg/dL (ref 65–99)
Potassium: 5 mmol/L (ref 3.5–5.1)
SODIUM: 145 mmol/L (ref 135–145)

## 2018-02-27 MED ORDER — HYDRALAZINE HCL 50 MG PO TABS: 75.0000 mg | ORAL_TABLET | Freq: Three times a day (TID) | ORAL | 11 refills | Status: DC

## 2018-02-27 NOTE — Patient Instructions (Signed)
STOP Losartan.  INCREASE Hydralazine to 75 mg (1.5 tabs) three times daily.  Routine lab work today. Will notify you of abnormal results, otherwise no news is good news!  Wear compression stockings daily. Remove for sleeping and bathing. Available at any medical supply store (take your prescription paper with you). [employer] (closes medical store from here): Address: [address] [place], [place] [address]  Phone: [phone]  Follow up with pharmacy in 2 weeks.  __________________________________________________________________ Marin Roberts Code: 3545  Take all medication as prescribed the day of your appointment. Bring all medications with you to your appointment.  Do the following things EVERYDAY: 1) Weigh yourself in the morning before breakfast. Write it down and keep it in a log. 2) Take your medicines as prescribed 3) Eat low salt foods-Limit salt (sodium) to 2000 mg per day.  4) Stay as active as you can everyday 5) Limit all fluids for the day to less than 2 liters

## 2018-02-27 NOTE — Progress Notes (Signed)
Advanced Heart Failure Clinic Note   Patient ID: Jenna Brown, female   DOB: Dec 20, 1950, 67 y.o.   MRN: [id number]   PCP: Dr. Vista Lawman Primary HF Cardiologist: Dr Haroldine Laws Renal:  Hulen Shouts [phone])   HPI:  Jenna Brown is a 67 y.o. female with hyperlipidemia, CVA, hypertension, type 2 diabetes mellitus, severe CAD, right AKA .   Admitted to [facility] 04/10/15 with increased dyspnea. Had RHC/LHC as noted below. Not candidate for CABG per Dr Prescott Gum due to comorbidities and poor targets. Required short term milrinone for cardiogenic shock. As she improved and fully diuresed, milrinone stopped. She had recommendations for lasix, spiro, losartan, hydralazine, and imdur.  Discharge weight was 158 pounds.     Admitted 4/8 -> 01/03/2017 with hypertensive emergency and AKI. BP up to 225/98 Troponin max to 4.62 felt to be demand ischemia. Started on Nitro + heparin gtt. Lasix and losartan held in AKI.  Renal consulted. Medications heavily adjusted.   Admitted 03/23/17 - 03/30/17 with NSTEMI. She had not been taking her Brilinta correctly. Decision was made to treat her medically as it was felt that she was at high risk of CIN with her CKD. Her isosorbide was increased to 90 mg daily. Her chest pain improved with increased isosorbide, creatinine was as high as 3.33. Her losartan was restarted at discharge after AKI resolved.   Admitted 01/11/18 with chest pain with NSTEMI. This was worse than her typical chest pain, which she gets 1-2 times weekly. Peak troponin 10.54. Not taken for cath with CKD IV-V. Treated with IV nitro and heparin. Not candidate for CABG per Dr. Prescott Gum.  Meds adjusted and pt remained asymptomatic for the remainder of visit.   She presents today for regular follow up. Last visit, hydralazine was supposed to be increased, but she did not increase. Her PCP started her on losartan 100 mg daily. She denies SOB, but is not very active. She has right BKA. She denies orthopnea or PND.  She has chronic LLE edema. She has not made follow up appointment with renal doctor yet, but is working on it. She is still urinating a lot. She has a decreased appetite. She eats sausage biscuits 2x/week. Limits fluid intake. Taking all medications. She is taking PRN lasix 2x/week.  Echo 12/27/16 LVEF 45-50%, diffuse hypokinesis, Grade 1 DD and ? Mass on left atrial wall. TEE 01/03/17 45%, trivial MR and TR, no LA/LAA thrombus or mass, negative bubble study.  Echo 03/2017:   RHC/LHC 04/14/15  Prox LAD to Mid LAD lesion, 90% stenosed. Diffuse disease  Ost Cx to Mid Cx lesion, 95% stenosed.  Ost 2nd Mrg to 2nd Mrg lesion, 80% stenosed.  Mid Cx lesion, 90% stenosed.  Prox RCA lesion, 60% stenosed.  Mid RCA lesion, 50% stenosed.  Review of systems complete and found to be negative unless listed in HPI.   SH:  Social History   Socioeconomic History  . Marital status: Single    Spouse name: Not on file  . Number of children: Not on file  . Years of education: Not on file  . Highest education level: Not on file  Occupational History  . Occupation: disabled  Social Needs  . Financial resource strain: Not on file  . Food insecurity:    Worry: Not on file    Inability: Not on file  . Transportation needs:    Medical: Not on file    Non-medical: Not on file  Tobacco Use  . Smoking status:  Former Smoker    Packs/day: 0.25    Years: 40.00    Pack years: 10.00    Types: Cigarettes    Last attempt to quit: 02/27/2012    Years since quitting: 6.0  . Smokeless tobacco: Never Used  Substance and Sexual Activity  . Alcohol use: No  . Drug use: No  . Sexual activity: Never    Birth control/protection: Post-menopausal  Lifestyle  . Physical activity:    Days per week: Not on file    Minutes per session: Not on file  . Stress: Not on file  Relationships  . Social connections:    Talks on phone: Not on file    Gets together: Not on file    Attends religious service: Not on file      Active member of club or organization: Not on file    Attends meetings of clubs or organizations: Not on file    Relationship status: Not on file  . Intimate partner violence:    Fear of current or ex partner: Not on file    Emotionally abused: Not on file    Physically abused: Not on file    Forced sexual activity: Not on file  Other Topics Concern  . Not on file  Social History Narrative   She lives in [place] with family.   FH:  Family History  Problem Relation Age of Onset  . Cancer Mother        breast  . Diabetes Father     Past Medical History:  Diagnosis Date  . Anemia   . Anxiety    takes Xanax prn  . Arthritis   . CKD (chronic kidney disease), stage IV ([place])   . Coronary artery disease 2016   cath w/ 90% LAD, 95%CFX, 80% OM 2, 60% RCA, not CABG candidate, rx medically  . GERD (gastroesophageal reflux disease)   . H/O hiatal hernia   . Headache(784.0)    when b/p is elevated  . Hemorrhoids   . Hx of AKA (above knee amputation), right ([place])   . Hyperlipidemia    takes Simvastatin daily  . Hypertension    takes Amlodipine/HCTZ/Losartan daily  . Migraines   . Myocardial infarction ([place]) 1990's  . Peripheral vascular disease ([place])   . PONV (postoperative nausea and vomiting)   . Stroke [facility])    TIA history  . Type II diabetes mellitus ([facility])    on Lantus  . Vertigo    takes Ativert prn    Current Outpatient Medications  Medication Sig Dispense Refill  . aspirin 81 MG chewable tablet Chew 81 mg by mouth daily.    . carvedilol (COREG) 12.5 MG tablet Take 12.5 mg by mouth 2 (two) times daily with a meal.    . hydrALAZINE (APRESOLINE) 50 MG tablet Take 1.5 tablets (75 mg total) by mouth 3 (three) times daily. 135 tablet 11  . isosorbide mononitrate (IMDUR) 30 MG 24 hr tablet Take 90 mg by mouth daily.    . ticagrelor (BRILINTA) 90 MG TABS tablet Take 1 tablet (90 mg total) by mouth 2 (two) times daily. 60 tablet 11  . acetaminophen (TYLENOL) 650 MG CR  tablet Take 650 mg by mouth every 8 (eight) hours as needed for pain.     Marland Kitchen ALPRAZolam (XANAX) 0.25 MG tablet Take 1 tablet (0.25 mg total) by mouth 3 (three) times daily as needed for anxiety. 12 tablet 0  . atorvastatin (LIPITOR) 80 MG tablet Take 80 mg by  mouth at bedtime.    . cholecalciferol (VITAMIN D) 1000 units tablet Take 1,000 Units by mouth daily.    . furosemide (LASIX) 40 MG tablet Take 1 tablet (40 mg total) by mouth daily as needed. 30 tablet 11  . insulin glargine (LANTUS) 100 UNIT/ML injection Inject 0.25 mLs (25 Units total) into the skin 2 (two) times daily. (Patient taking differently: Inject 25 Units into the skin at bedtime. ) 10 mL 11  . insulin lispro (HUMALOG) 100 UNIT/ML injection Inject 4 Units into the skin See admin instructions. Below 110 do not take it. 110-115 and above take 4 units    . nitroGLYCERIN (NITROSTAT) 0.4 MG SL tablet Place 1 tablet (0.4 mg total) under the tongue every 5 (five) minutes x 3 doses as needed for chest pain. 25 tablet 3   No current facility-administered medications for this encounter.    Vitals:   02/27/18 1415  BP: (!) 148/86  Pulse: 64  SpO2: 96%  Weight: 159 lb (72.1 kg)    Wt Readings from Last 3 Encounters:  02/27/18 159 lb (72.1 kg)  01/29/18 161 lb 12.8 oz (73.4 kg)  01/15/18 165 lb 4.8 oz (75 kg)    PHYSICAL EXAM: General: Elderly appearing. No resp difficulty. Arrived in wheelchair HEENT: Normal Neck: Supple. JVP 6-7. Carotids 2+ bilat; no bruits. No thyromegaly or nodule noted. Cor: PMI nondisplaced. RRR, No M/G/R noted Lungs: CTAB, normal effort. Abdomen: Soft, non-tender, non-distended, no HSM. No bruits or masses. +BS  Extremities: No cyanosis, clubbing, or rash. S/p R BKA. LLE 1-2+ edema.  Neuro: Alert & orientedx3, cranial nerves grossly intact. moves all 4 extremities w/o difficulty. Affect pleasant  ASSESSMENT & PLAN:  1. Chronic systolic CHF: ICM.  - Echo 03/24/17 EF 45-50%.  - NYHA III. Confounded by  deconditioning and immobility due to RKA.  - Volume status mildly elevated.  - Lasix on hold with AKI. Should take AS NEEDED until she sees renal. She is taking two times per week.  - Continue Coreg 12.5 mg BID - Increase hydralazine to 75 mg TID. (instructed to increase at last visit, but did not) - Continue Imdur 90 mg daily.  - Off Losartan with CKD. Creatinine 3.12 on most recent labs. Losartan was restarted by her PCP (filled 5/31). Will DC today with CKD.  - Encouraged medication compliance.   2.CAD - LHC 2016 with prox to mid 90% LAD lesion, ostial LCx 95% stenosed. Prox RCA 60%. Not candidate for CABG or PCI.  - Continue ASA and Brilinta.  - Continue lipitor.  - Denies s/s ischemia  3. PAD s/p R AKA - Stable. No change.   4. DMII  - Follows with PCP. No change.   5. TIA  - No recent symptoms.  - Continue statin and ASA.   6. CKD Stage IV-V - Sees Dr. Joesph July in [facility].  - Needs follow up ASAP. Encouraged her to make appointment.  - BMET today.   7. HTN - Meds as above.  - She is no longer taking amlodipine - she is not sure why. Will increase hydralazine as above. May need to add back amlodipine if BP still elevated at next visit.   BMET today DC losartan Increase hydralazine to 75 mg TID Follow up with pharmacy in 2 weeks for medication titration Follow up in APP clinic in 4 weeks.   Georgiana Shore, NP  3:21 PM   Greater than 50% of the 25 minute visit was spent in  counseling/coordination of care regarding disease state education, salt/fluid restriction, sliding scale diuretics, and medication compliance.

## 2018-03-21 ENCOUNTER — Other Ambulatory Visit (HOSPITAL_COMMUNITY): Payer: Medicare Other

## 2018-03-28 ENCOUNTER — Other Ambulatory Visit (HOSPITAL_COMMUNITY): Payer: Medicare Other

## 2018-03-29 ENCOUNTER — Other Ambulatory Visit (HOSPITAL_COMMUNITY): Payer: Medicare Other

## 2018-04-03 ENCOUNTER — Encounter (HOSPITAL_COMMUNITY): Payer: Medicare Other

## 2018-04-04 ENCOUNTER — Ambulatory Visit (HOSPITAL_COMMUNITY)
Admission: RE | Admit: 2018-04-04 | Discharge: 2018-04-04 | Disposition: A | Discharge status: Home / Self Care | Payer: Medicare Other | Source: Ambulatory Visit | Attending: Cardiology | Admitting: Cardiology

## 2018-04-04 VITALS — BP 158/92 | HR 75

## 2018-04-04 DIAGNOSIS — I132 Hypertensive heart and chronic kidney disease with heart failure and with stage 5 chronic kidney disease, or end stage renal disease: Secondary | ICD-10-CM | POA: Diagnosis not present

## 2018-04-04 DIAGNOSIS — E1122 Type 2 diabetes mellitus with diabetic chronic kidney disease: Secondary | ICD-10-CM | POA: Insufficient documentation

## 2018-04-04 DIAGNOSIS — Z8673 Personal history of transient ischemic attack (TIA), and cerebral infarction without residual deficits: Secondary | ICD-10-CM | POA: Insufficient documentation

## 2018-04-04 DIAGNOSIS — Z89611 Acquired absence of right leg above knee: Secondary | ICD-10-CM | POA: Insufficient documentation

## 2018-04-04 DIAGNOSIS — I5022 Chronic systolic (congestive) heart failure: Secondary | ICD-10-CM | POA: Diagnosis not present

## 2018-04-04 DIAGNOSIS — N185 Chronic kidney disease, stage 5: Secondary | ICD-10-CM | POA: Diagnosis not present

## 2018-04-04 DIAGNOSIS — I2510 Atherosclerotic heart disease of native coronary artery without angina pectoris: Secondary | ICD-10-CM | POA: Insufficient documentation

## 2018-04-04 DIAGNOSIS — E785 Hyperlipidemia, unspecified: Secondary | ICD-10-CM | POA: Diagnosis not present

## 2018-04-04 LAB — BASIC METABOLIC PANEL
ANION GAP: 7 (ref 5–15)
BUN: 38 mg/dL — AB (ref 8–23)
CO2: 27 mmol/L (ref 22–32)
Calcium: 8.5 mg/dL — ABNORMAL LOW (ref 8.9–10.3)
Chloride: 111 mmol/L (ref 98–111)
Creatinine, Ser: 3.21 mg/dL — ABNORMAL HIGH (ref 0.44–1.00)
GFR calc Af Amer: 16 mL/min — ABNORMAL LOW (ref >60)
GFR, EST NON AFRICAN AMERICAN: 14 mL/min — AB (ref >60)
Glucose, Bld: 110 mg/dL — ABNORMAL HIGH (ref 70–99)
POTASSIUM: 4.8 mmol/L (ref 3.5–5.1)
Sodium: 145 mmol/L (ref 135–145)

## 2018-04-04 NOTE — Progress Notes (Signed)
PCP: Dr. Vista Lawman Primary HF Cardiologist: Dr Haroldine Laws Renal:  Hulen Shouts [phone])   HPI:  Jenna Brown is a 67 y.o. female with hyperlipidemia, CVA, hypertension, type 2 diabetes mellitus, severe CAD, right AKA .   Admitted to [facility]/10/15 with increased dyspnea. Had RHC/LHC as noted below. Not candidate for CABG per Dr Prescott Gum due to comorbidities and poor targets. Required short term milrinone for cardiogenic shock. As she improved and fully diuresed, milrinone stopped. She had recommendations for lasix, spiro, losartan, hydralazine, and imdur.  Discharge weight was 158 pounds.     Admitted 4/8 -> 01/03/2017 with hypertensive emergency and AKI. BP up to 225/98 Troponin max to 4.62 felt to be demand ischemia. Started on Nitro + heparin gtt. Lasix and losartan held in AKI.  Renal consulted. Medications heavily adjusted.   Admitted 03/23/17 - 03/30/17 with NSTEMI. She had not been taking her Brilinta correctly. Decision was made to treat her medically as it was felt that she was at high risk of CIN with her CKD. Her isosorbide was increased to 90 mg daily. Her chest pain improved with increased isosorbide, creatinine was as high as 3.33. Her losartan was restarted at discharge after AKI resolved.   Admitted 01/11/18 with chest pain with NSTEMI. This was worse than her typical chest pain, which she gets 1-2 times weekly. Peak troponin 10.54. Not taken for cath with CKD IV-V. Treated with IV nitro and heparin. Not candidate for CABG per Dr. Prescott Gum.  Meds adjusted and pt remained asymptomatic for the remainder of visit.   Today she presents to pharmacy medication titration clinic for initial visit. At last visit, hydralazine was supposed to be increased, but she did not increase - beacuse she follows labels on her bottles. Losartan was stopped and Furosemide held with increase Cr. She denies orthopnea or PND. She denies SOB, but is not very active. She has right BKA.  She has chronic LLE  edema but when it gets worse she takes furosemide  - twice this past week - but she eats high salt foods like cheese doodles, sausage biscuits at [employer] and steak and cheese subs.States medication comliance.     . Shortness of breath/dyspnea on exertion? no  . Orthopnea/PND? no . Edema? yes . Lightheadedness/dizziness? no . Daily weights at home? no . Blood pressure/heart rate monitoring at home? no . Following low-sodium/fluid-restricted diet? no  HF Medications: Carvedilol 12.5mg  BID Furosemide 40mg  2x week if LE edema Hydralazine 50mg  TID Imdur 90mg  Daily  Has the patient been experiencing any side effects to the medications prescribed?  no  Does the patient have any problems obtaining medications due to transportation or finances?   no  Understanding of regimen: poor Understanding of indications: poor Potential of compliance: fair Patient understands to avoid NSAIDs. Patient understands to avoid decongestants.    Pertinent Lab Values: . Serum creatinine 3.2 (stable), CO2 27, Potassium 4.8 (stable), Sodium 145,   Vital Signs: . Weight:  (dry weight: 158lb at last hospital DC) . Blood pressure: 158/92  . Heart rate: 75 . O2 99%   Assessment: 1. Chronic CHF (EF 45%), due to HTN. NYHA class II-iII symptoms - she is mostly sedentary s/p BKA. - volume status elevated today with LE edema up to her knee - she admits to dietary indiscretion over last few days.  She plans on taking Furosemide 40mg  today -BP elevated - she has not had her afternoon hydralazine, nor did she increase her dose as instructed at  last visit.  Will do so today Increase hydralazine 75mg  TID -   This was discussed with her and her daughter and highlighted on AVS - Labs K and Cr stable -continue other HF medications Carvedilol 12.5mg  BID Furosemide 40mg  2x week if LE edema Imdur 90mg  Daily  - Basic disease state pathophysiology, medication indication, mechanism and side effects reviewed at length  with patient and he verbalized understanding   2.CAD - LHC 2016 with prox to mid 90% LAD lesion, ostial LCx 95% stenosed. Prox RCA 60%. Not candidate for CABG or PCI.  - Continue ASA and Brilinta.  - Continue lipitor.  - Denies s/s ischemia  3. PAD s/p R AKA - Stable. No change.   4. DMII  - Follows with PCP. No change.   5. TIA  - No recent symptoms.  - Continue statin and ASA.   6. CKD Stage IV-V - Sees Dr. Joesph July in [facility].  - Needs follow up ASAP. Encouraged her to make appointment.  - BMET today.   7. HTN - Meds as above.   Will increase hydralazine as above.   Plan: 1) Medication changes: Based on clinical presentation, vital signs and recent labs - volume status elevated today - take  Furosemide 40mg  today -BP elevated - Increase hydralazine 75mg  TID -   This was discussed with her and her daughter and highlighted on AVS - Labs K and Cr stable -continue other HF medications Carvedilol 12.5mg  BID Furosemide 40mg  2x week if LE edema Imdur 90mg  Daily  2) Labs: BMET with next visit 3) Follow-up: HF Clinic 04/10/18   Bonnita Nasuti Pharm.D. CPP, BCPS Clinical Pharmacist [phone] 04/04/2018 11:02 PM

## 2018-04-04 NOTE — Patient Instructions (Addendum)
Increase Hydralazine 75mg  = 1 AND 1/2 tablet three times as day Take Furosemide 40mg  twice a week if leg swells Pick low salt foods to eat  Follow Up July 23 at 2:30

## 2018-04-10 ENCOUNTER — Encounter (HOSPITAL_COMMUNITY): Payer: Medicare Other

## 2018-04-19 ENCOUNTER — Emergency Department (HOSPITAL_COMMUNITY): Payer: Medicare Other

## 2018-04-19 ENCOUNTER — Inpatient Hospital Stay (HOSPITAL_COMMUNITY)
Admission: EM | Admit: 2018-04-19 | Discharge: 2018-04-23 | DRG: 281 | Disposition: A | Discharge status: Home Health | Payer: Medicare Other | Attending: Internal Medicine | Admitting: Internal Medicine

## 2018-04-19 ENCOUNTER — Other Ambulatory Visit: Payer: Self-pay

## 2018-04-19 ENCOUNTER — Other Ambulatory Visit (HOSPITAL_COMMUNITY): Payer: Self-pay | Admitting: Internal Medicine

## 2018-04-19 ENCOUNTER — Encounter (HOSPITAL_COMMUNITY): Payer: Self-pay | Admitting: *Deleted

## 2018-04-19 DIAGNOSIS — Z89619 Acquired absence of unspecified leg above knee: Secondary | ICD-10-CM

## 2018-04-19 DIAGNOSIS — R0789 Other chest pain: Secondary | ICD-10-CM | POA: Diagnosis not present

## 2018-04-19 DIAGNOSIS — D631 Anemia in chronic kidney disease: Secondary | ICD-10-CM | POA: Diagnosis present

## 2018-04-19 DIAGNOSIS — K219 Gastro-esophageal reflux disease without esophagitis: Secondary | ICD-10-CM | POA: Diagnosis present

## 2018-04-19 DIAGNOSIS — Z79899 Other long term (current) drug therapy: Secondary | ICD-10-CM

## 2018-04-19 DIAGNOSIS — N185 Chronic kidney disease, stage 5: Secondary | ICD-10-CM | POA: Diagnosis not present

## 2018-04-19 DIAGNOSIS — I25110 Atherosclerotic heart disease of native coronary artery with unstable angina pectoris: Secondary | ICD-10-CM | POA: Diagnosis not present

## 2018-04-19 DIAGNOSIS — F419 Anxiety disorder, unspecified: Secondary | ICD-10-CM | POA: Diagnosis present

## 2018-04-19 DIAGNOSIS — Z888 Allergy status to other drugs, medicaments and biological substances status: Secondary | ICD-10-CM

## 2018-04-19 DIAGNOSIS — I5022 Chronic systolic (congestive) heart failure: Secondary | ICD-10-CM | POA: Diagnosis present

## 2018-04-19 DIAGNOSIS — E1122 Type 2 diabetes mellitus with diabetic chronic kidney disease: Secondary | ICD-10-CM | POA: Diagnosis present

## 2018-04-19 DIAGNOSIS — I214 Non-ST elevation (NSTEMI) myocardial infarction: Principal | ICD-10-CM | POA: Diagnosis present

## 2018-04-19 DIAGNOSIS — I2510 Atherosclerotic heart disease of native coronary artery without angina pectoris: Secondary | ICD-10-CM

## 2018-04-19 DIAGNOSIS — E785 Hyperlipidemia, unspecified: Secondary | ICD-10-CM

## 2018-04-19 DIAGNOSIS — I252 Old myocardial infarction: Secondary | ICD-10-CM

## 2018-04-19 DIAGNOSIS — Z794 Long term (current) use of insulin: Secondary | ICD-10-CM

## 2018-04-19 DIAGNOSIS — R112 Nausea with vomiting, unspecified: Secondary | ICD-10-CM | POA: Diagnosis not present

## 2018-04-19 DIAGNOSIS — I10 Essential (primary) hypertension: Secondary | ICD-10-CM | POA: Diagnosis not present

## 2018-04-19 DIAGNOSIS — E1151 Type 2 diabetes mellitus with diabetic peripheral angiopathy without gangrene: Secondary | ICD-10-CM | POA: Diagnosis present

## 2018-04-19 DIAGNOSIS — N186 End stage renal disease: Secondary | ICD-10-CM

## 2018-04-19 DIAGNOSIS — R079 Chest pain, unspecified: Secondary | ICD-10-CM

## 2018-04-19 DIAGNOSIS — Z992 Dependence on renal dialysis: Secondary | ICD-10-CM

## 2018-04-19 DIAGNOSIS — Z7982 Long term (current) use of aspirin: Secondary | ICD-10-CM

## 2018-04-19 DIAGNOSIS — R110 Nausea: Secondary | ICD-10-CM | POA: Diagnosis not present

## 2018-04-19 DIAGNOSIS — I132 Hypertensive heart and chronic kidney disease with heart failure and with stage 5 chronic kidney disease, or end stage renal disease: Secondary | ICD-10-CM | POA: Diagnosis not present

## 2018-04-19 DIAGNOSIS — Z885 Allergy status to narcotic agent status: Secondary | ICD-10-CM

## 2018-04-19 DIAGNOSIS — Z8673 Personal history of transient ischemic attack (TIA), and cerebral infarction without residual deficits: Secondary | ICD-10-CM

## 2018-04-19 DIAGNOSIS — Z87891 Personal history of nicotine dependence: Secondary | ICD-10-CM

## 2018-04-19 HISTORY — DX: Non-ST elevation (NSTEMI) myocardial infarction: I21.4

## 2018-04-19 LAB — BASIC METABOLIC PANEL
ANION GAP: 12 (ref 5–15)
BUN: 39 mg/dL — ABNORMAL HIGH (ref 8–23)
CO2: 20 mmol/L — ABNORMAL LOW (ref 22–32)
Calcium: 8.8 mg/dL — ABNORMAL LOW (ref 8.9–10.3)
Chloride: 108 mmol/L (ref 98–111)
Creatinine, Ser: 3.42 mg/dL — ABNORMAL HIGH (ref 0.44–1.00)
GFR, EST AFRICAN AMERICAN: 15 mL/min — AB (ref >60)
GFR, EST NON AFRICAN AMERICAN: 13 mL/min — AB (ref >60)
Glucose, Bld: 212 mg/dL — ABNORMAL HIGH (ref 70–99)
POTASSIUM: 5.2 mmol/L — AB (ref 3.5–5.1)
SODIUM: 140 mmol/L (ref 135–145)

## 2018-04-19 LAB — I-STAT TROPONIN, ED: TROPONIN I, POC: 0.1 ng/mL — AB (ref 0.00–0.08)

## 2018-04-19 LAB — CBC
HCT: 30.9 % — ABNORMAL LOW (ref 36.0–46.0)
HEMOGLOBIN: 9.2 g/dL — AB (ref 12.0–15.0)
MCH: 26.6 pg (ref 26.0–34.0)
MCHC: 29.8 g/dL — ABNORMAL LOW (ref 30.0–36.0)
MCV: 89.3 fL (ref 78.0–100.0)
Platelets: 302 10*3/uL (ref 150–400)
RBC: 3.46 MIL/uL — ABNORMAL LOW (ref 3.87–5.11)
RDW: 13.2 % (ref 11.5–15.5)
WBC: 11.4 10*3/uL — AB (ref 4.0–10.5)

## 2018-04-19 MED ORDER — VITAMIN D 1000 UNITS PO TABS
1000.0000 [IU] | ORAL_TABLET | Freq: Every day | ORAL | Status: DC
  Administered 2018-04-20 – 2018-04-23 (×4): 1000 [IU] via ORAL
  Filled 2018-04-19 (×4): qty 1

## 2018-04-19 MED ORDER — ATORVASTATIN CALCIUM 20 MG PO TABS
80.0000 mg | ORAL_TABLET | Freq: Every day | ORAL | Status: DC
  Administered 2018-04-20 – 2018-04-22 (×4): 80 mg via ORAL
  Filled 2018-04-19 (×4): qty 4

## 2018-04-19 MED ORDER — CARVEDILOL 12.5 MG PO TABS
12.5000 mg | ORAL_TABLET | Freq: Two times a day (BID) | ORAL | Status: DC
  Administered 2018-04-20: 12.5 mg via ORAL
  Filled 2018-04-19: qty 1

## 2018-04-19 MED ORDER — ISOSORBIDE MONONITRATE ER 30 MG PO TB24
90.0000 mg | ORAL_TABLET | Freq: Every day | ORAL | Status: DC
  Administered 2018-04-20: 90 mg via ORAL
  Filled 2018-04-19: qty 3

## 2018-04-19 MED ORDER — FERROUS SULFATE 325 (65 FE) MG PO TABS
325.0000 mg | ORAL_TABLET | Freq: Every day | ORAL | Status: DC
  Administered 2018-04-20 – 2018-04-23 (×4): 325 mg via ORAL
  Filled 2018-04-19 (×4): qty 1

## 2018-04-19 MED ORDER — ASPIRIN 81 MG PO CHEW: 81.0000 mg | CHEWABLE_TABLET | Freq: Every day | ORAL | Status: DC

## 2018-04-19 MED ORDER — SODIUM CHLORIDE 0.9 % IV SOLN: 250.0000 mL | INTRAVENOUS | Status: DC | PRN

## 2018-04-19 MED ORDER — ASPIRIN EC 81 MG PO TBEC
81.0000 mg | DELAYED_RELEASE_TABLET | Freq: Every day | ORAL | Status: DC
Start: 2018-04-20 — End: 2018-04-23
  Administered 2018-04-20 – 2018-04-23 (×4): 81 mg via ORAL
  Filled 2018-04-19 (×4): qty 1

## 2018-04-19 MED ORDER — NITROGLYCERIN 0.4 MG SL SUBL: 0.4000 mg | SUBLINGUAL_TABLET | SUBLINGUAL | Status: DC | PRN

## 2018-04-19 MED ORDER — METOPROLOL TARTRATE 5 MG/5ML IV SOLN
5.0000 mg | INTRAVENOUS | Status: DC | PRN
  Filled 2018-04-19: qty 5

## 2018-04-19 MED ORDER — SODIUM CHLORIDE 0.9% FLUSH: 3.0000 mL | INTRAVENOUS | Status: DC | PRN

## 2018-04-19 MED ORDER — LABETALOL HCL 5 MG/ML IV SOLN
20.0000 mg | Freq: Once | INTRAVENOUS | Status: AC
  Administered 2018-04-19: 20 mg via INTRAVENOUS
  Filled 2018-04-19: qty 4

## 2018-04-19 MED ORDER — ONDANSETRON HCL 4 MG/2ML IJ SOLN: 4.0000 mg | Freq: Four times a day (QID) | INTRAMUSCULAR | Status: DC | PRN

## 2018-04-19 MED ORDER — ALPRAZOLAM 0.25 MG PO TABS
0.2500 mg | ORAL_TABLET | Freq: Three times a day (TID) | ORAL | Status: DC | PRN
  Administered 2018-04-20 – 2018-04-21 (×2): 0.25 mg via ORAL
  Filled 2018-04-19 (×2): qty 1

## 2018-04-19 MED ORDER — SODIUM CHLORIDE 0.9% FLUSH
3.0000 mL | Freq: Two times a day (BID) | INTRAVENOUS | Status: DC
  Administered 2018-04-20 – 2018-04-23 (×4): 3 mL via INTRAVENOUS

## 2018-04-19 MED ORDER — HYDRALAZINE HCL 50 MG PO TABS
75.0000 mg | ORAL_TABLET | Freq: Three times a day (TID) | ORAL | Status: DC
  Administered 2018-04-20 – 2018-04-23 (×12): 75 mg via ORAL
  Filled 2018-04-19 (×12): qty 1

## 2018-04-19 MED ORDER — TICAGRELOR 90 MG PO TABS
90.0000 mg | ORAL_TABLET | Freq: Two times a day (BID) | ORAL | Status: DC
  Administered 2018-04-20 – 2018-04-23 (×7): 90 mg via ORAL
  Filled 2018-04-19 (×7): qty 1

## 2018-04-19 NOTE — H&P (Signed)
Cardiology Admission History and Physical:   Patient ID: Jenna Brown; MRN: [id number]; DOB: Oct 22, 1950   Admission date: 04/19/2018  Primary Care Provider:  [name], MD Primary Cardiologist:  Glori Bickers, MD    Chief Complaint:  Chest pain  History of Present Illness:   Jenna Brown is a 67 y.o. female with a history of extensive coronary artery disease (not a candidate for CABG due to poor targets), hypertension, chronic kidney disease,arthritis, anxiety, GERD, right AKA, hyperlipidemia, peripheral arterial disease, type 2 diabetes mellitus, and previous TIA who presents with complaints of chest pain. The patient was out all day today with her daughter running errands. When she returned home in the afternoon she began experiencing substernal chest pain. It was associated with some shortness of breath. She has been compliant with her dual antiplatelet therapy. She denies any recent orthopnea or paroxysmal nocturnal dyspnea. She took 2 sublingual nitroglycerin with no change in her discomfort. In the emergency department her blood pressure was noted to be elevated at 196/120 mmHg. Her initial troponin was 0.10 and the ECG showed mild ST depressions with T-wave inversions in the inferolateral leads.  In the ED she was treated with intravenous labetalol and further doses of nitroglycerin. Once her blood pressure came down her chest pain improved. Currently she is chest pain-free.   Recent cardiac testing Echo 12/27/16 LVEF 45-50%, diffuse hypokinesis, Grade 1 DD and ? Mass on left atrial wall. TEE 01/03/17 45%, trivial MR and TR, no LA/LAA thrombus or mass, negative bubble study.  RHC/LHC 04/14/15  Prox LAD to Mid LAD lesion, 90% stenosed. Diffuse disease  Ost Cx to Mid Cx lesion, 95% stenosed.  Ost 2nd Mrg to 2nd Mrg lesion, 80% stenosed.  Mid Cx lesion, 90% stenosed.  Prox RCA lesion, 60% stenosed.  Mid RCA lesion, 50% stenosed.      Past Medical History:    Diagnosis Date  . Anemia   . Anxiety    takes Xanax prn  . Arthritis   . CKD (chronic kidney disease), stage IV ([place])   . Coronary artery disease 2016   cath w/ 90% LAD, 95%CFX, 80% OM 2, 60% RCA, not CABG candidate, rx medically  . GERD (gastroesophageal reflux disease)   . H/O hiatal hernia   . Headache(784.0)    when b/p is elevated  . Hemorrhoids   . Hx of AKA (above knee amputation), right ([place])   . Hyperlipidemia    takes Simvastatin daily  . Hypertension    takes Amlodipine/HCTZ/Losartan daily  . Migraines   . Myocardial infarction ([place]) 1990's  . NSTEMI (non-ST elevated myocardial infarction) ([place])   . Peripheral vascular disease ([place])   . PONV (postoperative nausea and vomiting)   . Stroke [facility])    TIA history  . Type II diabetes mellitus ([facility])    on Lantus  . Vertigo    takes Ativert prn    Past Surgical History:  Procedure Laterality Date  . ABDOMINAL AORTAGRAM N/A 02/29/2012   Procedure: ABDOMINAL Maxcine Ham;  Surgeon: [name], MD;  Location: [facility] CATH LAB;  Service: Cardiovascular;  Laterality: N/A;  . AMPUTATION  05/10/2012   Procedure: AMPUTATION ABOVE KNEE;  Surgeon: [name], MD;  Location: [place] OR;  Service: Vascular;  Laterality: Right;   open right groin wound noted  . aortogram  02/2012  . CARDIAC CATHETERIZATION N/A 04/13/2015   Procedure: Right/Left Heart Cath and Coronary Angiography;  Surgeon: [name], MD;  Location: [place] CV  LAB;  Service: Cardiovascular;  Laterality: N/A;  . CLEFT PALATE REPAIR     several  . COLONOSCOPY    . DILATION AND CURETTAGE OF UTERUS    . FEMORAL-POPLITEAL BYPASS GRAFT  04/05/2012   Procedure: BYPASS GRAFT FEMORAL-POPLITEAL ARTERY;  Surgeon: [name], MD;  Location: [place];  Service: Vascular;  Laterality: Right;  REVISION  . FEMORAL-TIBIAL BYPASS GRAFT  03/29/2012   Procedure: BYPASS GRAFT FEMORAL-TIBIAL ARTERY;  Surgeon: [name], MD;  Location: [place] OR;  Service: Vascular;   Laterality: Right;  Right femoral to Posterior Tibialis with composite graft of 88mm x 80 cm ringed gortex graft and saphenous vein  ,intraoperative arteriogram  . FEMOROPOPLITEAL THROMBECTOMY / EMBOLECTOMY  05/09/2012  . MYOMECTOMY    . PR VEIN BYPASS GRAFT,AORTO-FEM-POP  05/27/11   Right SFA-Below knee Pop BP  . TEE WITHOUT CARDIOVERSION N/A 01/03/2017   Procedure: TRANSESOPHAGEAL ECHOCARDIOGRAM (TEE);  Surgeon: Skeet Latch, MD;  Location: [facility] ENDOSCOPY;  Service: Cardiovascular;  Laterality: N/A;  . TONSILLECTOMY    . TUBAL LIGATION  ~ 1990     Medications Prior to Admission: Prior to Admission medications   Medication Sig Start Date End Date Taking? Authorizing Provider  acetaminophen (TYLENOL) 650 MG CR tablet Take 650 mg by mouth every 8 (eight) hours as needed for pain.     [provider]  ALPRAZolam Duanne Moron) 0.25 MG tablet Take 1 tablet (0.25 mg total) by mouth 3 (three) times daily as needed for anxiety. 01/03/17   Delos Haring, PA-C  aspirin 81 MG chewable tablet Chew 81 mg by mouth daily.    [provider]  atorvastatin (LIPITOR) 80 MG tablet Take 80 mg by mouth at bedtime. 01/09/18   [provider]  carvedilol (COREG) 12.5 MG tablet Take 12.5 mg by mouth 2 (two) times daily with a meal.    [provider]  cholecalciferol (VITAMIN D) 1000 units tablet Take 1,000 Units by mouth daily.    [provider]  ferrous sulfate 325 (65 FE) MG tablet Take 325 mg by mouth daily with breakfast.    [provider]  furosemide (LASIX) 40 MG tablet Take 1 tablet (40 mg total) by mouth daily as needed. 01/29/18   [name], PA-C  hydrALAZINE (APRESOLINE) 50 MG tablet Take 1.5 tablets (75 mg total) by mouth 3 (three) times daily. 02/27/18   Georgiana Shore, NP  insulin glargine (LANTUS) 100 UNIT/ML injection Inject 0.25 mLs (25 Units total) into the skin 2 (two) times daily. Patient taking differently: Inject 25 Units into the  skin at bedtime.  01/03/17   Delos Haring, PA-C  insulin lispro (HUMALOG) 100 UNIT/ML injection Inject 4 Units into the skin See admin instructions. Below 110 do not take it. 110-115 and above take 4 units    [provider]  isosorbide mononitrate (IMDUR) 30 MG 24 hr tablet TAKE 3 TABLETS BY MOUTH ONCE DAILY 04/19/18   Bensimhon, Shaune Pascal, MD  nitroGLYCERIN (NITROSTAT) 0.4 MG SL tablet Place 1 tablet (0.4 mg total) under the tongue every 5 (five) minutes x 3 doses as needed for chest pain. Patient not taking: Reported on 04/04/2018 08/13/15   Theora Gianotti, NP  ticagrelor (BRILINTA) 90 MG TABS tablet Take 1 tablet (90 mg total) by mouth 2 (two) times daily. 01/03/17   Delos Haring, PA-C     Allergies:    Allergies  Allergen Reactions  . Plavix [Clopidogrel Bisulfate] Itching  . Codeine Other (See Comments)    "  makes me feel strange"  . Tylenol With Codeine #3 [Acetaminophen-Codeine] Other (See Comments)    "doesn't make me feel right"    Social History:   Social History   Socioeconomic History  . Marital status: Single    Spouse name: Not on file  . Number of children: Not on file  . Years of education: Not on file  . Highest education level: Not on file  Occupational History  . Occupation: disabled  Social Needs  . Financial resource strain: Not on file  . Food insecurity:    Worry: Not on file    Inability: Not on file  . Transportation needs:    Medical: Not on file    Non-medical: Not on file  Tobacco Use  . Smoking status: Former Smoker    Packs/day: 0.25    Years: 40.00    Pack years: 10.00    Types: Cigarettes    Last attempt to quit: 02/27/2012    Years since quitting: 6.1  . Smokeless tobacco: Never Used  Substance and Sexual Activity  . Alcohol use: No  . Drug use: No  . Sexual activity: Never    Birth control/protection: Post-menopausal  Lifestyle  . Physical activity:    Days per week: Not on file    Minutes per session: Not on  file  . Stress: Not on file  Relationships  . Social connections:    Talks on phone: Not on file    Gets together: Not on file    Attends religious service: Not on file    Active member of club or organization: Not on file    Attends meetings of clubs or organizations: Not on file    Relationship status: Not on file  . Intimate partner violence:    Fear of current or ex partner: Not on file    Emotionally abused: Not on file    Physically abused: Not on file    Forced sexual activity: Not on file  Other Topics Concern  . Not on file  Social History Narrative   She lives in [place] with family.    Family History:  The patient's family history includes Cancer in her mother; Diabetes in her father.    Review of Systems: [y] = yes, [ ]  = no   . General: Weight gain [ ] ; Weight loss [ ] ; Anorexia [ ] ; Fatigue [ ] ; Fever [ ] ; Chills [ ] ; Weakness [ ]   . Cardiac: Chest pain/pressure Blue.Reese ]; Resting SOB Blue.Reese ]; Exertional SOB [ ] ; Orthopnea [ ] ; Pedal Edema [ ] ; Palpitations [ ] ; Syncope [ ] ; Presyncope [ ] ; Paroxysmal nocturnal dyspnea[ ]   . Pulmonary: Cough [ ] ; Wheezing[ ] ; Hemoptysis[ ] ; Sputum [ ] ; Snoring [ ]   . GI: Vomiting[ ] ; Dysphagia[ ] ; Melena[ ] ; Hematochezia [ ] ; Heartburn[ ] ; Abdominal pain [ ] ; Constipation [ ] ; Diarrhea [ ] ; BRBPR [ ]   . GU: Hematuria[ ] ; Dysuria [ ] ; Nocturia[ ]   . Vascular: Pain in legs with walking [ ] ; Pain in feet with lying flat [ ] ; Non-healing sores [ ] ; Stroke [ ] ; TIA [ ] ; Slurred speech [ ] ;  . Neuro: Headaches[ ] ; Vertigo[ ] ; Seizures[ ] ; Paresthesias[ ] ;Blurred vision [ ] ; Diplopia [ ] ; Vision changes [ ]   . Ortho/Skin: Arthritis [ ] ; Joint pain [ ] ; Muscle pain [ ] ; Joint swelling [ ] ; Back Pain [ ] ; Rash [ ]   . Psych: Depression[ ] ; Anxiety[ ]   . Heme: Bleeding problems [ ] ;  Clotting disorders [ ] ; Anemia [ ]   . Endocrine: Diabetes [ ] ; Thyroid dysfunction[ ]     Physical Exam/Data:   Vitals:   04/19/18 2045 04/19/18 2100 04/19/18 2115  04/19/18 2130  BP: (!) 148/116 (!) 157/66 (!) 159/73 (!) 142/88  Pulse: (!) 105 93 96 91  Resp: (!) 28 17 (!) 23 (!) 27  Temp:      SpO2: 95% 98% 98% 99%   No intake or output data in the 24 hours ending 04/19/18 2211 There were no vitals filed for this visit. There is no height or weight on file to calculate BMI.  General:  Well nourished, well developed, in no acute distress HEENT: normal Lymph: no adenopathy Neck: no JVD Endocrine:  No thryomegaly Vascular: No carotid bruits; FA pulses 2+ bilaterally without bruits  Cardiac:  normal S1, S2; RRR; no murmur  Lungs:  clear to auscultation bilaterally, no wheezing, rhonchi or rales  Abd: soft, nontender, no hepatomegaly  Ext: no edema Musculoskeletal:  No deformities, BUE and BLE strength normal and equal Skin: warm and dry  Neuro:  CNs 2-12 intact, no focal abnormalities noted Psych:  Normal affect    EKG:  ECG today documents sinus tachycardia, T-wave inversions and mild ST depressions in the inferolateral leads and poor R-wave progression  Laboratory Data:  Chemistry Recent Labs  Lab 04/19/18 1943  NA 140  K 5.2*  CL 108  CO2 20*  GLUCOSE 212*  BUN 39*  CREATININE 3.42*  CALCIUM 8.8*  GFRNONAA 13*  GFRAA 15*  ANIONGAP 12    No results for input(s): PROT, ALBUMIN, AST, ALT, ALKPHOS, BILITOT in the last 168 hours. Hematology Recent Labs  Lab 04/19/18 1943  WBC 11.4*  RBC 3.46*  HGB 9.2*  HCT 30.9*  MCV 89.3  MCH 26.6  MCHC 29.8*  RDW 13.2  PLT 302   Cardiac EnzymesNo results for input(s): TROPONINI in the last 168 hours.  Recent Labs  Lab 04/19/18 1950  TROPIPOC 0.10*    BNPNo results for input(s): BNP, PROBNP in the last 168 hours.  DDimer No results for input(s): DDIMER in the last 168 hours.  Radiology/Studies:  Dg Chest 2 View  Result Date: 04/19/2018 CLINICAL DATA:  Chest pain EXAM: CHEST - 2 VIEW COMPARISON:  01/11/2018 FINDINGS: Mild cardiomegaly. No focal consolidation or effusion. No  pneumothorax. Degenerative changes of the spine. IMPRESSION: No active cardiopulmonary disease.  Cardiomegaly. Electronically Signed   By: Donavan Foil M.D.   On: 04/19/2018 20:20    Assessment and Plan:   1. Non-revascularized coronary artery disease LHC 2016 with prox to mid 90% LAD lesion, ostial LCx 95% stenosed. Prox RCA 60%. Not candidate for CABG or PCI. The ECG today documents sinus tachycardia, T-wave inversions and mild ST depressions in the inferolateral leads and poor R-wave progression. The initial troponin is 0.10. She is currently chest pain-free.  - Trend cardiac biomarkers and obtain serial ECGs. - Intravenous unfractionated heparin for anticoagulation. - Continue aspirin and Ticagrelor - Atorvastatin 80 mg daily - Continue carvedilol and isosorbide mononitrate - She is not an ideal candidate for repeat cardiac catheterization due to her CKD - Since GFR is <30 will avoid using Ranexa   2. Chronic systolic CHF  - Continue carvedilol, hydralazine, and isosorbide mononitrate - She currently does not need loop diuretics as she is euvolemic on exam.  3. Chronic kidney disease, Stage IV-V  - Will avoid angiotensin receptor blockers, spironolactone and Ace inhibitors. - Monitor serum creatinine, GFR  and urine output. Will check daily weights and I's and O's.   Severity of Illness: The appropriate patient status for this patient is OBSERVATION. Observation status is judged to be reasonable and necessary in order to provide the required intensity of service to ensure the patient's safety. The patient's presenting symptoms, physical exam findings, and initial radiographic and laboratory data in the context of their medical condition is felt to place them at decreased risk for further clinical deterioration. Furthermore, it is anticipated that the patient will be medically stable for discharge from the hospital within 2 midnights of admission. The following factors support the  patient status of observation.   " The patient's presenting symptoms include chest pain. " The physical exam findings include tachycardia. " The initial radiographic and laboratory data are mildly elevated troponin.     For questions or updates, please contact [place] Please consult [email or web address] for contact info under Cardiology/STEMI.    Signed, Meade Maw, MD  04/19/2018 10:11 PM

## 2018-04-19 NOTE — ED Notes (Signed)
Cardiology at bedside.

## 2018-04-19 NOTE — ED Notes (Signed)
[place] sandwich and ginger ale given; Pt informed of NPO status after midnight

## 2018-04-19 NOTE — ED Triage Notes (Signed)
Pt c/o chest pressure that started today. Took nitro x1 with improvement then pain came back shortly after. Reports emesis x3. EMS gave 324 mg ASA and nitro x1 with complete relief of pain. Edema noted to LLE which pt reports is baseline for her

## 2018-04-19 NOTE — ED Provider Notes (Signed)
[facility] EMERGENCY DEPARTMENT Provider Note   CSN: [id number] Arrival date & time: 04/19/18  1934   History   Chief Complaint Chief Complaint  Patient presents with  . Chest Pain    HPI Jenna Brown is a 67 y.o. female.  HPI Patient is a 67 year old female with history of CAD, CKD, peripheral vascular disease with AKA, hypertension, hyperlipidemia who presents to the emergency department today for evaluation of chest pain.  Pain began around 1:30  P.m. Today. Took nitro with improvement however later returned. Did have associated vomiting. Pain better in ED. States that it does not like prior heart attack.  Takes aspirin and brilinta daily and reports she has been compliant with these.  No anticoagulants. No recent illnesses or fevers.   Past Medical History:  Diagnosis Date  . Anemia   . Anxiety    takes Xanax prn  . Arthritis   . CKD (chronic kidney disease), stage IV ([place])   . Coronary artery disease 2016   cath w/ 90% LAD, 95%CFX, 80% OM 2, 60% RCA, not CABG candidate, rx medically  . GERD (gastroesophageal reflux disease)   . H/O hiatal hernia   . Headache(784.0)    when b/p is elevated  . Hemorrhoids   . Hx of AKA (above knee amputation), right ([place])   . Hyperlipidemia    takes Simvastatin daily  . Hypertension    takes Amlodipine/HCTZ/Losartan daily  . Migraines   . Myocardial infarction ([place]) 1990's  . NSTEMI (non-ST elevated myocardial infarction) ([place])   . Peripheral vascular disease ([place])   . PONV (postoperative nausea and vomiting)   . Stroke [facility])    TIA history  . Type II diabetes mellitus (HCC)    on Lantus  . Vertigo    takes Ativert prn    Patient Active Problem List   Diagnosis Date Noted  . Unstable angina pectoris due to coronary arteriosclerosis ([place]) 04/19/2018  . Goals of care, counseling/discussion   . Palliative care by specialist   . Chronic kidney disease, stage V ([place])   . Insulin-requiring or dependent type II  diabetes mellitus ([place]) 01/11/2018  . Cardiac mass   . Chronic kidney disease (CKD), stage IV (severe) ([place]) 12/26/2016  . Hypertensive emergency   . CAD in native artery   . Cardiomyopathy, ischemic 05/14/2015  . TIA (transient ischemic attack) 05/14/2015  . PAD (peripheral artery disease) ([place]) 05/14/2015  . Chronic systolic heart failure ([place]) 05/14/2015  . Coronary artery disease due to lipid rich plaque   . Cardiac enzymes elevated   . NSTEMI (non-ST elevated myocardial infarction) ([place])   . Essential hypertension   . HLD (hyperlipidemia)   . Anemia 05/10/2012  . 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 Stroke ([place])   . Peripheral vascular disease [facility])     Past Surgical History:  Procedure Laterality Date  . ABDOMINAL AORTAGRAM N/A 02/29/2012   Procedure: ABDOMINAL Maxcine Ham;  Surgeon: [name], MD;  Location: [facility] CATH LAB;  Service: Cardiovascular;  Laterality: N/A;  . AMPUTATION  05/10/2012   Procedure: AMPUTATION ABOVE KNEE;  Surgeon: [name], MD;  Location: [facility] OR;  Service: Vascular;  Laterality: Right;   open right groin wound noted  . aortogram  02/2012  . CARDIAC CATHETERIZATION N/A 04/13/2015   Procedure: Right/Left Heart Cath and Coronary Angiography;  Surgeon: [name], MD;  Location: [place] [facility] CV LAB;  Service: Cardiovascular;  Laterality: N/A;  .  CLEFT PALATE REPAIR     several  . COLONOSCOPY    . DILATION AND CURETTAGE OF UTERUS    . FEMORAL-POPLITEAL BYPASS GRAFT  04/05/2012   Procedure: BYPASS GRAFT FEMORAL-POPLITEAL ARTERY;  Surgeon: [name], MD;  Location: [place];  Service: Vascular;  Laterality: Right;  REVISION  . FEMORAL-TIBIAL BYPASS GRAFT  03/29/2012   Procedure: BYPASS GRAFT FEMORAL-TIBIAL ARTERY;  Surgeon: [name], MD;  Location: [facility] OR;  Service: Vascular;  Laterality: Right;  Right femoral to Posterior Tibialis with composite graft of 37mm x 80 cm ringed gortex graft and saphenous vein   ,intraoperative arteriogram  . FEMOROPOPLITEAL THROMBECTOMY / EMBOLECTOMY  05/09/2012  . MYOMECTOMY    . PR VEIN BYPASS GRAFT,AORTO-FEM-POP  05/27/11   Right SFA-Below knee Pop BP  . TEE WITHOUT CARDIOVERSION N/A 01/03/2017   Procedure: TRANSESOPHAGEAL ECHOCARDIOGRAM (TEE);  Surgeon: Skeet Latch, MD;  Location: [facility] ENDOSCOPY;  Service: Cardiovascular;  Laterality: N/A;  . TONSILLECTOMY    . TUBAL LIGATION  ~ 1990     OB History   None      Home Medications    Prior to Admission medications   Medication Sig Start Date End Date Taking? Authorizing Provider  acetaminophen (TYLENOL) 650 MG CR tablet Take 650 mg by mouth every 8 (eight) hours as needed for pain.    Yes [provider]  ALPRAZolam (XANAX) 0.25 MG tablet Take 1 tablet (0.25 mg total) by mouth 3 (three) times daily as needed for anxiety. 01/03/17  Yes Carlota Raspberry, Tiffany, PA-C  aspirin 81 MG chewable tablet Chew 81 mg by mouth daily.   Yes [provider]  atorvastatin (LIPITOR) 80 MG tablet Take 80 mg by mouth at bedtime. 01/09/18  Yes [provider]  carvedilol (COREG) 12.5 MG tablet Take 12.5 mg by mouth 2 (two) times daily with a meal.   Yes [provider]  cholecalciferol (VITAMIN D) 1000 units tablet Take 1,000 Units by mouth daily.   Yes [provider]  ferrous sulfate 325 (65 FE) MG tablet Take 325 mg by mouth once a week.    Yes [provider]  furosemide (LASIX) 40 MG tablet Take 1 tablet (40 mg total) by mouth daily as needed. Patient taking differently: Take 40 mg by mouth daily as needed for fluid or edema.  01/29/18  Yes [name], PA-C  hydrALAZINE (APRESOLINE) 50 MG tablet Take 1.5 tablets (75 mg total) by mouth 3 (three) times daily. Patient taking differently: Take 75 mg by mouth 2 (two) times daily.  02/27/18  Yes Georgiana Shore, NP  insulin glargine (LANTUS) 100 UNIT/ML injection Inject 0.25 mLs (25 Units total) into the skin 2 (two) times  daily. Patient taking differently: Inject 25 Units into the skin at bedtime.  01/03/17  Yes Carlota Raspberry, Tiffany, PA-C  insulin lispro (HUMALOG) 100 UNIT/ML injection Inject 4 Units into the skin See admin instructions. Below 110 do not take it. 110-115 and above take 4 units   Yes [provider]  isosorbide mononitrate (IMDUR) 30 MG 24 hr tablet TAKE 3 TABLETS BY MOUTH ONCE DAILY 04/19/18  Yes Bensimhon, Shaune Pascal, MD  nitroGLYCERIN (NITROSTAT) 0.4 MG SL tablet Place 1 tablet (0.4 mg total) under the tongue every 5 (five) minutes x 3 doses as needed for chest pain. 08/13/15  Yes Theora Gianotti, NP  ticagrelor (BRILINTA) 90 MG TABS tablet Take 1 tablet (90 mg total) by mouth 2 (two) times daily. 01/03/17  Yes Carlota Raspberry,  Tiffany, PA-C    Family History Family History  Problem Relation Age of Onset  . Cancer Mother        breast  . Diabetes Father     Social History Social History   Tobacco Use  . Smoking status: Former Smoker    Packs/day: 0.25    Years: 40.00    Pack years: 10.00    Types: Cigarettes    Last attempt to quit: 02/27/2012    Years since quitting: 6.1  . Smokeless tobacco: Never Used  Substance Use Topics  . Alcohol use: No  . Drug use: No     Allergies   Plavix [clopidogrel bisulfate]; Codeine; and Tylenol with codeine #3 [acetaminophen-codeine]   Review of Systems Review of Systems  Constitutional: Negative for chills and fever.  HENT: Negative for congestion and sore throat.   Eyes: Negative for visual disturbance.  Respiratory: Negative for cough and shortness of breath.   Cardiovascular: Positive for chest pain. Leg swelling: chronic unchanged.  Gastrointestinal: Positive for nausea and vomiting. Negative for abdominal pain and diarrhea.  Genitourinary: Negative for dysuria and hematuria.  Musculoskeletal: Negative for back pain and neck pain.  Skin: Negative for color change and rash.  Neurological: Negative for weakness and headaches.  All  other systems reviewed and are negative.    Physical Exam Updated Vital Signs BP (!) 160/77 (BP Location: Left Arm)   Pulse 84   Temp 98.4 F (36.9 C) (Oral)   Resp 18   SpO2 100%   Physical Exam  Constitutional:  Non-toxic appearance. She does not appear ill. No distress.  Appears older than stated age.   HENT:  Head: Normocephalic and atraumatic.  Eyes: Conjunctivae are normal.  Neck: Neck supple.  Cardiovascular: Regular rhythm and intact distal pulses. Tachycardia present.  Pulses:      Radial pulses are 2+ on the right side, and 2+ on the left side.  Pulmonary/Chest: Effort normal and breath sounds normal. No respiratory distress.  Abdominal: Soft. She exhibits no distension. There is no tenderness.  Musculoskeletal: She exhibits no edema.  Neurological: She is alert.  Skin: Skin is warm and dry.  Psychiatric: She has a normal mood and affect.  Nursing note and vitals reviewed.    ED Treatments / Results  Labs (all labs ordered are listed, but only abnormal results are displayed) Labs Reviewed  BASIC METABOLIC PANEL - Abnormal; Notable for the following components:      Result Value   Potassium 5.2 (*)    CO2 20 (*)    Glucose, Bld 212 (*)    BUN 39 (*)    Creatinine, Ser 3.42 (*)    Calcium 8.8 (*)    GFR calc non Af Amer 13 (*)    GFR calc Af Amer 15 (*)    All other components within normal limits  CBC - Abnormal; Notable for the following components:   WBC 11.4 (*)    RBC 3.46 (*)    Hemoglobin 9.2 (*)    HCT 30.9 (*)    MCHC 29.8 (*)    All other components within normal limits  I-STAT TROPONIN, ED - Abnormal; Notable for the following components:   Troponin i, poc 0.10 (*)    All other components within normal limits  TROPONIN I  TROPONIN I  TROPONIN I  BASIC METABOLIC PANEL  LIPID PANEL  CBC  PROTIME-INR  BRAIN NATRIURETIC PEPTIDE  I-STAT TROPONIN, ED    EKG EKG Interpretation  Date/Time:  Thursday April 19 2018 19:47:00  EDT Ventricular Rate:  110 PR Interval:    QRS Duration: 99 QT Interval:  335 QTC Calculation: 454 R Axis:   67 Text Interpretation:  Sinus tachycardia Repol abnrm suggests ischemia, anterolateral Baseline wander in lead(s) V4 anterior t wave changes decreased since last Confirmed by Dorie Rank [phone]) on 04/19/2018 7:51:24 PM   Radiology Dg Chest 2 View  Result Date: 04/19/2018 CLINICAL DATA:  Chest pain EXAM: CHEST - 2 VIEW COMPARISON:  01/11/2018 FINDINGS: Mild cardiomegaly. No focal consolidation or effusion. No pneumothorax. Degenerative changes of the spine. IMPRESSION: No active cardiopulmonary disease.  Cardiomegaly. Electronically Signed   By: Donavan Foil M.D.   On: 04/19/2018 20:20    Procedures Procedures (including critical care time)  Medications Ordered in ED Medications  aspirin EC tablet 81 mg (has no administration in time range)  nitroGLYCERIN (NITROSTAT) SL tablet 0.4 mg (has no administration in time range)  ondansetron (ZOFRAN) injection 4 mg (has no administration in time range)  sodium chloride flush (NS) 0.9 % injection 3 mL (has no administration in time range)  sodium chloride flush (NS) 0.9 % injection 3 mL (has no administration in time range)  0.9 %  sodium chloride infusion (has no administration in time range)  ALPRAZolam (XANAX) tablet 0.25 mg (has no administration in time range)  aspirin chewable tablet 81 mg (has no administration in time range)  atorvastatin (LIPITOR) tablet 80 mg (has no administration in time range)  carvedilol (COREG) tablet 12.5 mg (has no administration in time range)  cholecalciferol (VITAMIN D) tablet 1,000 Units (has no administration in time range)  ferrous sulfate tablet 325 mg (has no administration in time range)  hydrALAZINE (APRESOLINE) tablet 75 mg (has no administration in time range)  isosorbide mononitrate (IMDUR) 24 hr tablet 90 mg (has no administration in time range)  ticagrelor (BRILINTA) tablet 90 mg (has no  administration in time range)  labetalol (NORMODYNE,TRANDATE) injection 20 mg (20 mg Intravenous Given 04/19/18 2056)     Initial Impression / Assessment and Plan / ED Course  I have reviewed the triage vital signs and the nursing notes.  Pertinent labs & imaging results that were available during my care of the patient were reviewed by me and considered in my medical decision making (see chart for details).    Patient is a 67 year old female with history of CAD, CKD, peripheral vascular disease with AKA, hypertension, hyperlipidemia who presents to the emergency department today for evaluation of chest pain.   Patient afebrile, tachycardic to low 100s and hypertensive to nearly 417 systolic at presentation. Reports feeling some better. High risk for ACS given history CAD. ECG reviewed shows rate approximately 110, sinus tach, normal axis, intervals within normal limits, does have T wave inversions in leads I, 2, aVL, V4-V6. When compared to ECG from 01/12/18 rate has increased and new TWI in lead II.  Could be hypertensive emergency  - BP improved with labetalol. Trop elevated. Aspirin given. Pain free in ED. Chronic trop leak. Will obtain delta trop before initiating anticoagulatoin. CXR no acute cardiopulm abnormality. Less likely PE given hypoxia and chronic leg swelling. More typical for ACS. BMP remarkable for stable renal fx consistent with prior labs of CKD. CBC with chronic anemia and mild leukocytosis though no infectious signs or symptoms. Discussed with cardiology who evaluated in ED and will admit for further obs and workup of high risk chest pain. Pt agreeable with plan. Stable time of transfer to inpt  room.   Case and plan of care discussed with Dr. Tomi Bamberger.   Final Clinical Impressions(s) / ED Diagnoses   Final diagnoses:  Chest pain with high risk for cardiac etiology    ED Discharge Orders    None       Corrie Dandy, MD 04/20/18 Adelfa Koh    Dorie Rank, MD 04/20/18 1225

## 2018-04-20 ENCOUNTER — Encounter (HOSPITAL_COMMUNITY): Payer: Self-pay

## 2018-04-20 ENCOUNTER — Other Ambulatory Visit: Payer: Self-pay

## 2018-04-20 DIAGNOSIS — D631 Anemia in chronic kidney disease: Secondary | ICD-10-CM

## 2018-04-20 DIAGNOSIS — K219 Gastro-esophageal reflux disease without esophagitis: Secondary | ICD-10-CM | POA: Diagnosis present

## 2018-04-20 DIAGNOSIS — Z87891 Personal history of nicotine dependence: Secondary | ICD-10-CM | POA: Diagnosis not present

## 2018-04-20 DIAGNOSIS — E1151 Type 2 diabetes mellitus with diabetic peripheral angiopathy without gangrene: Secondary | ICD-10-CM | POA: Diagnosis present

## 2018-04-20 DIAGNOSIS — Z79899 Other long term (current) drug therapy: Secondary | ICD-10-CM | POA: Diagnosis not present

## 2018-04-20 DIAGNOSIS — I10 Essential (primary) hypertension: Secondary | ICD-10-CM | POA: Diagnosis not present

## 2018-04-20 DIAGNOSIS — R079 Chest pain, unspecified: Secondary | ICD-10-CM | POA: Diagnosis not present

## 2018-04-20 DIAGNOSIS — Z8673 Personal history of transient ischemic attack (TIA), and cerebral infarction without residual deficits: Secondary | ICD-10-CM | POA: Diagnosis not present

## 2018-04-20 DIAGNOSIS — D649 Anemia, unspecified: Secondary | ICD-10-CM | POA: Diagnosis not present

## 2018-04-20 DIAGNOSIS — E785 Hyperlipidemia, unspecified: Secondary | ICD-10-CM | POA: Diagnosis present

## 2018-04-20 DIAGNOSIS — N185 Chronic kidney disease, stage 5: Secondary | ICD-10-CM | POA: Diagnosis present

## 2018-04-20 DIAGNOSIS — I25110 Atherosclerotic heart disease of native coronary artery with unstable angina pectoris: Secondary | ICD-10-CM | POA: Diagnosis present

## 2018-04-20 DIAGNOSIS — F419 Anxiety disorder, unspecified: Secondary | ICD-10-CM | POA: Diagnosis present

## 2018-04-20 DIAGNOSIS — I252 Old myocardial infarction: Secondary | ICD-10-CM | POA: Diagnosis not present

## 2018-04-20 DIAGNOSIS — I5022 Chronic systolic (congestive) heart failure: Secondary | ICD-10-CM | POA: Diagnosis present

## 2018-04-20 DIAGNOSIS — Z89619 Acquired absence of unspecified leg above knee: Secondary | ICD-10-CM | POA: Diagnosis not present

## 2018-04-20 DIAGNOSIS — E1122 Type 2 diabetes mellitus with diabetic chronic kidney disease: Secondary | ICD-10-CM | POA: Diagnosis present

## 2018-04-20 DIAGNOSIS — I132 Hypertensive heart and chronic kidney disease with heart failure and with stage 5 chronic kidney disease, or end stage renal disease: Secondary | ICD-10-CM | POA: Diagnosis present

## 2018-04-20 DIAGNOSIS — Z7982 Long term (current) use of aspirin: Secondary | ICD-10-CM | POA: Diagnosis not present

## 2018-04-20 DIAGNOSIS — I25118 Atherosclerotic heart disease of native coronary artery with other forms of angina pectoris: Secondary | ICD-10-CM | POA: Diagnosis not present

## 2018-04-20 DIAGNOSIS — I214 Non-ST elevation (NSTEMI) myocardial infarction: Principal | ICD-10-CM

## 2018-04-20 DIAGNOSIS — Z888 Allergy status to other drugs, medicaments and biological substances status: Secondary | ICD-10-CM | POA: Diagnosis not present

## 2018-04-20 DIAGNOSIS — Z885 Allergy status to narcotic agent status: Secondary | ICD-10-CM | POA: Diagnosis not present

## 2018-04-20 DIAGNOSIS — Z794 Long term (current) use of insulin: Secondary | ICD-10-CM | POA: Diagnosis not present

## 2018-04-20 LAB — TROPONIN I
TROPONIN I: 1.3 ng/mL — AB (ref <0.03)
Troponin I: 0.88 ng/mL (ref <0.03)
Troponin I: 2.26 ng/mL (ref <0.03)
Troponin I: 1.37 ng/mL (ref <0.03)

## 2018-04-20 LAB — CBC
HEMATOCRIT: 26.2 % — AB (ref 36.0–46.0)
Hemoglobin: 7.7 g/dL — ABNORMAL LOW (ref 12.0–15.0)
MCH: 26.4 pg (ref 26.0–34.0)
MCHC: 29.4 g/dL — ABNORMAL LOW (ref 30.0–36.0)
MCV: 89.7 fL (ref 78.0–100.0)
PLATELETS: 254 10*3/uL (ref 150–400)
RBC: 2.92 MIL/uL — AB (ref 3.87–5.11)
RDW: 13 % (ref 11.5–15.5)
WBC: 10.7 10*3/uL — AB (ref 4.0–10.5)

## 2018-04-20 LAB — PROTIME-INR
INR: 1.16
Prothrombin Time: 14.7 seconds (ref 11.4–15.2)

## 2018-04-20 LAB — BASIC METABOLIC PANEL
Anion gap: 8 (ref 5–15)
BUN: 40 mg/dL — AB (ref 8–23)
CALCIUM: 8.3 mg/dL — AB (ref 8.9–10.3)
CO2: 22 mmol/L (ref 22–32)
Chloride: 111 mmol/L (ref 98–111)
Creatinine, Ser: 3.65 mg/dL — ABNORMAL HIGH (ref 0.44–1.00)
GFR calc Af Amer: 14 mL/min — ABNORMAL LOW (ref >60)
GFR, EST NON AFRICAN AMERICAN: 12 mL/min — AB (ref >60)
Glucose, Bld: 159 mg/dL — ABNORMAL HIGH (ref 70–99)
Potassium: 4.8 mmol/L (ref 3.5–5.1)
Sodium: 141 mmol/L (ref 135–145)

## 2018-04-20 LAB — LIPID PANEL
CHOL/HDL RATIO: 4.5 ratio
Cholesterol: 174 mg/dL (ref 0–200)
HDL: 39 mg/dL — ABNORMAL LOW (ref >40)
LDL CALC: 119 mg/dL — AB (ref 0–99)
Triglycerides: 78 mg/dL (ref <150)
VLDL: 16 mg/dL (ref 0–40)

## 2018-04-20 LAB — GLUCOSE, CAPILLARY
Glucose-Capillary: 101 mg/dL — ABNORMAL HIGH (ref 70–99)
Glucose-Capillary: 187 mg/dL — ABNORMAL HIGH (ref 70–99)
Glucose-Capillary: 123 mg/dL — ABNORMAL HIGH (ref 70–99)

## 2018-04-20 LAB — BRAIN NATRIURETIC PEPTIDE: B NATRIURETIC PEPTIDE 5: 443.9 pg/mL — AB (ref 0.0–100.0)

## 2018-04-20 LAB — HEPARIN LEVEL (UNFRACTIONATED): HEPARIN UNFRACTIONATED: 0.64 [IU]/mL (ref 0.30–0.70)

## 2018-04-20 MED ORDER — INSULIN GLARGINE 100 UNIT/ML ~~LOC~~ SOLN
8.0000 [IU] | Freq: Every day | SUBCUTANEOUS | Status: DC
  Administered 2018-04-20 – 2018-04-22 (×3): 8 [IU] via SUBCUTANEOUS
  Filled 2018-04-20 (×4): qty 0.08

## 2018-04-20 MED ORDER — ISOSORBIDE MONONITRATE ER 60 MG PO TB24
120.0000 mg | ORAL_TABLET | Freq: Every day | ORAL | Status: DC
  Administered 2018-04-21 – 2018-04-23 (×3): 120 mg via ORAL
  Filled 2018-04-20 (×3): qty 2

## 2018-04-20 MED ORDER — INSULIN ASPART 100 UNIT/ML ~~LOC~~ SOLN
0.0000 [IU] | Freq: Three times a day (TID) | SUBCUTANEOUS | Status: DC
  Administered 2018-04-20 – 2018-04-21 (×3): 2 [IU] via SUBCUTANEOUS
  Administered 2018-04-21: 1 [IU] via SUBCUTANEOUS
  Administered 2018-04-22: 2 [IU] via SUBCUTANEOUS
  Administered 2018-04-22: 1 [IU] via SUBCUTANEOUS
  Administered 2018-04-22: 2 [IU] via SUBCUTANEOUS
  Administered 2018-04-23: 1 [IU] via SUBCUTANEOUS
  Administered 2018-04-23: 3 [IU] via SUBCUTANEOUS

## 2018-04-20 MED ORDER — HEPARIN BOLUS VIA INFUSION
3000.0000 [IU] | Freq: Once | INTRAVENOUS | Status: AC
  Administered 2018-04-20: 3000 [IU] via INTRAVENOUS
  Filled 2018-04-20: qty 3000

## 2018-04-20 MED ORDER — HEPARIN (PORCINE) IN NACL 100-0.45 UNIT/ML-% IJ SOLN
900.0000 [IU]/h | INTRAMUSCULAR | Status: DC
  Administered 2018-04-20 (×2): 900 [IU]/h via INTRAVENOUS
  Filled 2018-04-20: qty 250

## 2018-04-20 MED ORDER — EZETIMIBE 10 MG PO TABS
10.0000 mg | ORAL_TABLET | Freq: Every day | ORAL | Status: DC
  Administered 2018-04-20 – 2018-04-23 (×4): 10 mg via ORAL
  Filled 2018-04-20 (×4): qty 1

## 2018-04-20 MED ORDER — CARVEDILOL 6.25 MG PO TABS
18.7500 mg | ORAL_TABLET | Freq: Two times a day (BID) | ORAL | Status: DC
  Administered 2018-04-20 – 2018-04-23 (×6): 18.75 mg via ORAL
  Filled 2018-04-20 (×6): qty 1

## 2018-04-20 MED ORDER — ACETAMINOPHEN 325 MG PO TABS
650.0000 mg | ORAL_TABLET | Freq: Four times a day (QID) | ORAL | Status: DC | PRN
  Administered 2018-04-20 – 2018-04-22 (×5): 650 mg via ORAL
  Filled 2018-04-20 (×6): qty 2

## 2018-04-20 NOTE — Progress Notes (Signed)
Inpatient Diabetes Program Recommendations  AACE/ADA: New Consensus Statement on Inpatient Glycemic Control (2015)  Target Ranges:  Prepandial:   less than 140 mg/dL      Peak postprandial:   less than 180 mg/dL (1-2 hours)      Critically ill patients:  140 - 180 mg/dL   Results for MIN, TUNNELL (MRN [id number]) as of 04/20/2018 10:15  Ref. Range 04/19/2018 19:43 04/20/2018 03:13  Glucose Latest Ref Range: 70 - 99 mg/dL 212 (H) 159 (H)    Admit with: CP  History: DM, CKD  Home DM Meds: Lantus 25 units QHS       Humalog 4 units when CBG >110 mg/dl  Current Insulin Orders: None      MD- Patient with History of Diabetes.    Takes Lantus and Humalog at home.  Please consider the following:  1. Start Lantus 8 units QHS (1/3 total home dose)  2. Start Novolog Sensitive Correction Scale/ SSI (0-9 units) Q4 hours      --Will follow patient during hospitalization--  Wyn Quaker RN, MSN, CDE Diabetes Coordinator Inpatient Glycemic Control Team Team Pager: [phone] (8a-5p)

## 2018-04-20 NOTE — Progress Notes (Addendum)
Progress Note  Patient Name: Jenna Brown Date of Encounter: 04/20/2018  Primary Cardiologist: Glori Bickers, MD   Subjective   Chest pain improving on IV heparin however trended down today to 7.7 from 9.2 yesterday. Denies dark or blood in stool.   Inpatient Medications    Scheduled Meds: . aspirin EC  81 mg Oral Daily  . atorvastatin  80 mg Oral QHS  . carvedilol  12.5 mg Oral BID WC  . cholecalciferol  1,000 Units Oral Daily  . ferrous sulfate  325 mg Oral Q breakfast  . hydrALAZINE  75 mg Oral Q8H  . insulin aspart  0-9 Units Subcutaneous TID WC  . insulin glargine  8 Units Subcutaneous QHS  . isosorbide mononitrate  90 mg Oral Daily  . sodium chloride flush  3 mL Intravenous Q12H  . ticagrelor  90 mg Oral BID   Continuous Infusions: . sodium chloride    . heparin 900 Units/hr (04/20/18 0101)   PRN Meds: sodium chloride, ALPRAZolam, nitroGLYCERIN, ondansetron (ZOFRAN) IV, sodium chloride flush   Vital Signs    Vitals:   04/19/18 2330 04/20/18 0003 04/20/18 0443 04/20/18 1031  BP: (!) 162/78 (!) 160/77 (!) 177/82 (!) 156/68  Pulse: 87 84 83 78  Resp: 17 18 18    Temp:  98.4 F (36.9 C) 98.5 F (36.9 C)   TempSrc:  Oral Oral   SpO2: 98% 100% 97% 96%  Weight:  161 lb 4.8 oz (73.2 kg)    Height:  5\' 4"  (1.626 m)      Intake/Output Summary (Last 24 hours) at 04/20/2018 1124 Last data filed at 04/20/2018 1034 Gross per 24 hour  Intake 374.06 ml  Output 1100 ml  Net -725.94 ml   Filed Weights   04/20/18 0003  Weight: 161 lb 4.8 oz (73.2 kg)    Telemetry    Sinus rhythm - Personally Reviewed  ECG    SR with inferior lateral TWI inversion chronically - Personally Reviewed  Physical Exam   GEN: No acute distress.   Neck: No JVD Cardiac: RRR, no murmurs, rubs, or gallops.  Respiratory: Clear to auscultation bilaterally. GI: Soft, nontender, non-distended  MS: S/p R AKA. Trace edema on left leg Neuro:  Nonfocal  Psych: Normal affect   Labs      Chemistry Recent Labs  Lab 04/19/18 1943 04/20/18 0313  NA 140 141  K 5.2* 4.8  CL 108 111  CO2 20* 22  GLUCOSE 212* 159*  BUN 39* 40*  CREATININE 3.42* 3.65*  CALCIUM 8.8* 8.3*  GFRNONAA 13* 12*  GFRAA 15* 14*  ANIONGAP 12 8     Hematology Recent Labs  Lab 04/19/18 1943 04/20/18 0313  WBC 11.4* 10.7*  RBC 3.46* 2.92*  HGB 9.2* 7.7*  HCT 30.9* 26.2*  MCV 89.3 89.7  MCH 26.6 26.4  MCHC 29.8* 29.4*  RDW 13.2 13.0  PLT 302 254    Cardiac Enzymes Recent Labs  Lab 04/20/18 0112 04/20/18 0313  TROPONINI 0.88* 1.30*    Recent Labs  Lab 04/19/18 1950  TROPIPOC 0.10*     BNP Recent Labs  Lab 04/20/18 0112  BNP 443.9*     Lipid Panel     Component Value Date/Time   CHOL 174 04/20/2018 0112   TRIG 78 04/20/2018 0112   HDL 39 (L) 04/20/2018 0112   CHOLHDL 4.5 04/20/2018 0112   VLDL 16 04/20/2018 0112   LDLCALC 119 (H) 04/20/2018 0112   DDimer No results for input(s):  DDIMER in the last 168 hours.   Radiology    Dg Chest 2 View  Result Date: 04/19/2018 CLINICAL DATA:  Chest pain EXAM: CHEST - 2 VIEW COMPARISON:  01/11/2018 FINDINGS: Mild cardiomegaly. No focal consolidation or effusion. No pneumothorax. Degenerative changes of the spine. IMPRESSION: No active cardiopulmonary disease.  Cardiomegaly. Electronically Signed   By: Donavan Foil M.D.   On: 04/19/2018 20:20    Cardiac Studies   None this admission   Patient Profile     67 y.o. female with a history of extensive coronary artery disease (not a candidate for CABG due to poor targets), hypertension, chronic kidney disease,arthritis, anxiety, GERD, right AKA, hyperlipidemia, peripheral arterial disease, type 2 diabetes mellitus, and previous TIA who presented with angina after exertional activity.   Assessment & Plan    1. CAD - LHC 2016 with prox to mid 90% LAD lesion, ostial LCx 95% stenosed. Prox RCA 60%.Not candidate for CABG or PCI. EKG with chronic ST/T wave changes.  - Troponin  trending up 0.88>>1.3>>2.26. She is treated with IV heparin however hemoglobin trending down 9.2>>7.7. Difficult situation. She is not a candidate for cath due to CKD and other co morbidities. However anemia. Will review with MD. She denies melena or blood in her stool or urine.   2. Chronic systolic CHF - volume status stable. Continue current medications.   3. CKD stage IV-V -Avoid nephrotoxic agent  4. Acute anemia - heparin per MD in setting of worsening troponin and anemia.   For questions or updates, please contact [facility] Please consult [email or web address] for contact info under Cardiology/STEMI.    Mahalia Longest Rolla, PA  04/20/2018, 11:24 AM    Patient seen and examined. Agree with assessment and plan. Chest pain improved. Troponin 0.88 > 1.30 c/w NSTEMI.  ECG independently reviewed by me shows NSR at 85 with T wave inversion I, aVL, V4-6. Cath data reviewed. Stage 5 CKD precludes invasive re-evalution.  HR decreased today with heparin institution. Currently CP free.  Check stool guaiacs.  Will DC heparin. F/U CBC.  Increase coreg to 18.75 mg bid with HR in the 70s and BP increased. Increase imdur to 120 mg daily. Continue DAPT.   If BP remains elevated add amlodipine.  Add zetia with LDL 119 on atorvastatin 80 mg.    Troy Sine, MD, [facility] 04/20/2018 11:53 AM

## 2018-04-20 NOTE — Progress Notes (Signed)
ANTICOAGULATION CONSULT NOTE - Initial Consult  Pharmacy Consult for Heparin  Indication: chest pain/ACS  Allergies  Allergen Reactions  . Plavix [Clopidogrel Bisulfate] Itching  . Codeine Other (See Comments)    "makes me feel strange"  . Tylenol With Codeine #3 [Acetaminophen-Codeine] Other (See Comments)    "doesn't make me feel right"   Patient Measurements: Height: 5\' 4"  (162.6 cm) Weight: 161 lb 4.8 oz (73.2 kg)(scale b) IBW/kg (Calculated) : 54.7  Vital Signs: Temp: 98.4 F (36.9 C) (08/02 0003) Temp Source: Oral (08/02 0003) BP: 160/77 (08/02 0003) Pulse Rate: 84 (08/02 0003)  Labs: Recent Labs    04/19/18 1943  HGB 9.2*  HCT 30.9*  PLT 302  CREATININE 3.42*    Estimated Creatinine Clearance: 16.3 mL/min (A) (by C-G formula based on SCr of 3.42 mg/dL (H)).   Medical History: Past Medical History:  Diagnosis Date  . Anemia   . Anxiety    takes Xanax prn  . Arthritis   . CKD (chronic kidney disease), stage IV ([place])   . Coronary artery disease 2016   cath w/ 90% LAD, 95%CFX, 80% OM 2, 60% RCA, not CABG candidate, rx medically  . GERD (gastroesophageal reflux disease)   . H/O hiatal hernia   . Headache(784.0)    when b/p is elevated  . Hemorrhoids   . Hx of AKA (above knee amputation), right ([place])   . Hyperlipidemia    takes Simvastatin daily  . Hypertension    takes Amlodipine/HCTZ/Losartan daily  . Migraines   . Myocardial infarction ([place]) 1990's  . NSTEMI (non-ST elevated myocardial infarction) ([place])   . Peripheral vascular disease ([place])   . PONV (postoperative nausea and vomiting)   . Stroke [facility])    TIA history  . Type II diabetes mellitus (HCC)    on Lantus  . Vertigo    takes Ativert prn   Assessment: [name] with known CAD (not a CABG candidate) here with chest pain, to start heparin, Hgb 9.2, noted renal dysfunction. PTA meds reviewed.   Goal of Therapy:  Heparin level 0.3-0.7 units/ml Monitor platelets by anticoagulation  protocol: Yes   Plan:  Heparin 3000 units BOLUS Start heparin drip at 900 units/hr (previously therapeutic rate) 0900 HL Daily CBC/HL Monitor for bleeding   [place], [place] 04/20/2018,12:31 AM

## 2018-04-20 NOTE — Progress Notes (Signed)
CRITICAL VALUE ALERT  Critical Value:  Troponin of 2.26 Date & Time Notied: 04/20/2018 at 11:48 am Provider Notified: Paged MD- MD aware

## 2018-04-20 NOTE — Plan of Care (Signed)

## 2018-04-21 DIAGNOSIS — D649 Anemia, unspecified: Secondary | ICD-10-CM

## 2018-04-21 DIAGNOSIS — I25118 Atherosclerotic heart disease of native coronary artery with other forms of angina pectoris: Secondary | ICD-10-CM

## 2018-04-21 DIAGNOSIS — I10 Essential (primary) hypertension: Secondary | ICD-10-CM

## 2018-04-21 DIAGNOSIS — I5022 Chronic systolic (congestive) heart failure: Secondary | ICD-10-CM

## 2018-04-21 LAB — BASIC METABOLIC PANEL
ANION GAP: 8 (ref 5–15)
BUN: 44 mg/dL — AB (ref 8–23)
CALCIUM: 8.3 mg/dL — AB (ref 8.9–10.3)
CO2: 21 mmol/L — AB (ref 22–32)
Chloride: 110 mmol/L (ref 98–111)
Creatinine, Ser: 4.03 mg/dL — ABNORMAL HIGH (ref 0.44–1.00)
GFR calc Af Amer: 13 mL/min — ABNORMAL LOW (ref >60)
GFR, EST NON AFRICAN AMERICAN: 11 mL/min — AB (ref >60)
GLUCOSE: 146 mg/dL — AB (ref 70–99)
POTASSIUM: 5 mmol/L (ref 3.5–5.1)
Sodium: 139 mmol/L (ref 135–145)

## 2018-04-21 LAB — TROPONIN I
Troponin I: 0.95 ng/mL (ref <0.03)
Troponin I: 0.76 ng/mL (ref <0.03)

## 2018-04-21 LAB — GLUCOSE, CAPILLARY
GLUCOSE-CAPILLARY: 139 mg/dL — AB (ref 70–99)
GLUCOSE-CAPILLARY: 159 mg/dL — AB (ref 70–99)
GLUCOSE-CAPILLARY: 176 mg/dL — AB (ref 70–99)
Glucose-Capillary: 156 mg/dL — ABNORMAL HIGH (ref 70–99)

## 2018-04-21 LAB — CBC
HEMATOCRIT: 26.1 % — AB (ref 36.0–46.0)
Hemoglobin: 7.8 g/dL — ABNORMAL LOW (ref 12.0–15.0)
MCH: 26.6 pg (ref 26.0–34.0)
MCHC: 29.9 g/dL — ABNORMAL LOW (ref 30.0–36.0)
MCV: 89.1 fL (ref 78.0–100.0)
PLATELETS: 251 10*3/uL (ref 150–400)
RBC: 2.93 MIL/uL — AB (ref 3.87–5.11)
RDW: 13.2 % (ref 11.5–15.5)
WBC: 7.9 10*3/uL (ref 4.0–10.5)

## 2018-04-21 MED ORDER — AMLODIPINE BESYLATE 5 MG PO TABS
5.0000 mg | ORAL_TABLET | Freq: Every day | ORAL | Status: DC
  Administered 2018-04-21 – 2018-04-23 (×3): 5 mg via ORAL
  Filled 2018-04-21 (×3): qty 1

## 2018-04-21 MED ORDER — DOCUSATE SODIUM 100 MG PO CAPS
100.0000 mg | ORAL_CAPSULE | Freq: Every day | ORAL | Status: DC | PRN
  Administered 2018-04-21: 100 mg via ORAL
  Filled 2018-04-21 (×2): qty 1

## 2018-04-21 NOTE — Progress Notes (Signed)
Progress Note  Patient Name: Jenna Brown Date of Encounter: 04/21/2018  Primary Cardiologist: Glori Bickers, MD   Subjective   She denies chest pain and shortness of breath.  Her only complaint is a headache.  Inpatient Medications    Scheduled Meds: . amLODipine  5 mg Oral Daily  . aspirin EC  81 mg Oral Daily  . atorvastatin  80 mg Oral QHS  . carvedilol  18.75 mg Oral BID WC  . cholecalciferol  1,000 Units Oral Daily  . ezetimibe  10 mg Oral Daily  . ferrous sulfate  325 mg Oral Q breakfast  . hydrALAZINE  75 mg Oral Q8H  . insulin aspart  0-9 Units Subcutaneous TID WC  . insulin glargine  8 Units Subcutaneous QHS  . isosorbide mononitrate  120 mg Oral Daily  . sodium chloride flush  3 mL Intravenous Q12H  . ticagrelor  90 mg Oral BID   Continuous Infusions: . sodium chloride     PRN Meds: sodium chloride, acetaminophen, ALPRAZolam, nitroGLYCERIN, ondansetron (ZOFRAN) IV, sodium chloride flush   Vital Signs    Vitals:   04/20/18 1031 04/20/18 2120 04/21/18 0543 04/21/18 0842  BP: (!) 156/68 (!) 152/70 (!) 155/81 (!) 168/76  Pulse: 78 77 87 79  Resp:  17    Temp:  98.7 F (37.1 C) 98.3 F (36.8 C)   TempSrc:  Oral Oral   SpO2: 96% 96% 98%   Weight:   160 lb (72.6 kg)   Height:        Intake/Output Summary (Last 24 hours) at 04/21/2018 1204 Last data filed at 04/21/2018 0904 Gross per 24 hour  Intake 1217.33 ml  Output 950 ml  Net 267.33 ml   Filed Weights   04/20/18 0003 04/21/18 0543  Weight: 161 lb 4.8 oz (73.2 kg) 160 lb (72.6 kg)    Telemetry    Sinus rhythm- Personally Reviewed  ECG    No new tracings- Personally Reviewed  Physical Exam   GEN: No acute distress.   Neck: No JVD Cardiac: RRR, no murmurs, rubs, or gallops.  Respiratory: Clear to auscultation bilaterally. GI: Soft, nontender, non-distended  MS: No edema; right AKA.   Neuro:  Nonfocal  Psych: Normal affect   Labs    Chemistry Recent Labs  Lab 04/19/18 1943  04/20/18 0313 04/21/18 0829  NA 140 141 139  K 5.2* 4.8 5.0  CL 108 111 110  CO2 20* 22 21*  GLUCOSE 212* 159* 146*  BUN 39* 40* 44*  CREATININE 3.42* 3.65* 4.03*  CALCIUM 8.8* 8.3* 8.3*  GFRNONAA 13* 12* 11*  GFRAA 15* 14* 13*  ANIONGAP 12 8 8      Hematology Recent Labs  Lab 04/19/18 1943 04/20/18 0313 04/21/18 0829  WBC 11.4* 10.7* 7.9  RBC 3.46* 2.92* 2.93*  HGB 9.2* 7.7* 7.8*  HCT 30.9* 26.2* 26.1*  MCV 89.3 89.7 89.1  MCH 26.6 26.4 26.6  MCHC 29.8* 29.4* 29.9*  RDW 13.2 13.0 13.2  PLT 302 254 251    Cardiac Enzymes Recent Labs  Lab 04/20/18 1004 04/20/18 1829 04/21/18 0224 04/21/18 0829  TROPONINI 2.26* 1.37* 0.95* 0.76*    Recent Labs  Lab 04/19/18 1950  TROPIPOC 0.10*     BNP Recent Labs  Lab 04/20/18 0112  BNP 443.9*     DDimer No results for input(s): DDIMER in the last 168 hours.   Radiology    Dg Chest 2 View  Result Date: 04/19/2018 CLINICAL DATA:  Chest  pain EXAM: CHEST - 2 VIEW COMPARISON:  01/11/2018 FINDINGS: Mild cardiomegaly. No focal consolidation or effusion. No pneumothorax. Degenerative changes of the spine. IMPRESSION: No active cardiopulmonary disease.  Cardiomegaly. Electronically Signed   By: Donavan Foil M.D.   On: 04/19/2018 20:20    Cardiac Studies   None this admission  Patient Profile     67 y.o. female with a history of extensive coronary artery disease (not a candidate for CABG due to poor targets), hypertension, chronic kidney disease,arthritis, anxiety, GERD, right AKA, hyperlipidemia, peripheral arterial disease, type 2 diabetes mellitus, and previous TIA who presented with angina after exertional activity.   Assessment & Plan    1.  Non-STEMI: Symptomatically stable.  Troponins have trended down to 0.76 this morning.  Not a candidate for CABG or PCI.  Continue dual antiplatelet therapy with aspirin and Brilinta along with carvedilol, Imdur, atorvastatin, and Zetia.  I will aim to control blood pressure with  the addition of amlodipine.  2.  Hypertension: Blood pressure remains elevated.  She is on carvedilol, Imdur, and hydralazine.  I will add amlodipine 5 mg daily.  3.  Anemia: Hemoglobin low at 7.8 but remained stable.  4.  Chronic kidney disease stage V: Creatinine 4.03 today.  Not a candidate for PCI.  5.  Chronic systolic heart failure, LVEF 45 to 50%: Euvolemic at present.  6.  Hyperlipidemia: Currently on atorvastatin 80 mg.  LDL 119.  Ezetimibe added.   For questions or updates, please contact [place] Please consult [email or web address] for contact info under Cardiology/STEMI.      Signed, Kate Sable, MD  04/21/2018, 12:04 PM

## 2018-04-21 NOTE — Progress Notes (Signed)
Patient sleeping during shift report.      

## 2018-04-22 DIAGNOSIS — R079 Chest pain, unspecified: Secondary | ICD-10-CM

## 2018-04-22 DIAGNOSIS — I2510 Atherosclerotic heart disease of native coronary artery without angina pectoris: Secondary | ICD-10-CM

## 2018-04-22 LAB — BASIC METABOLIC PANEL
ANION GAP: 7 (ref 5–15)
BUN: 45 mg/dL — ABNORMAL HIGH (ref 8–23)
CALCIUM: 8.3 mg/dL — AB (ref 8.9–10.3)
CHLORIDE: 110 mmol/L (ref 98–111)
CO2: 23 mmol/L (ref 22–32)
Creatinine, Ser: 4.24 mg/dL — ABNORMAL HIGH (ref 0.44–1.00)
GFR calc non Af Amer: 10 mL/min — ABNORMAL LOW (ref >60)
GFR, EST AFRICAN AMERICAN: 12 mL/min — AB (ref >60)
GLUCOSE: 146 mg/dL — AB (ref 70–99)
POTASSIUM: 4.7 mmol/L (ref 3.5–5.1)
Sodium: 140 mmol/L (ref 135–145)

## 2018-04-22 LAB — GLUCOSE, CAPILLARY
GLUCOSE-CAPILLARY: 159 mg/dL — AB (ref 70–99)
GLUCOSE-CAPILLARY: 192 mg/dL — AB (ref 70–99)
GLUCOSE-CAPILLARY: 159 mg/dL — AB (ref 70–99)
Glucose-Capillary: 137 mg/dL — ABNORMAL HIGH (ref 70–99)

## 2018-04-22 LAB — CBC
HEMATOCRIT: 24.6 % — AB (ref 36.0–46.0)
HEMOGLOBIN: 7.3 g/dL — AB (ref 12.0–15.0)
MCH: 26.6 pg (ref 26.0–34.0)
MCHC: 29.7 g/dL — ABNORMAL LOW (ref 30.0–36.0)
MCV: 89.8 fL (ref 78.0–100.0)
Platelets: 234 10*3/uL (ref 150–400)
RBC: 2.74 MIL/uL — ABNORMAL LOW (ref 3.87–5.11)
RDW: 13.2 % (ref 11.5–15.5)
WBC: 8 10*3/uL (ref 4.0–10.5)

## 2018-04-22 LAB — OCCULT BLOOD X 1 CARD TO LAB, STOOL: Fecal Occult Bld: NEGATIVE

## 2018-04-22 NOTE — Progress Notes (Signed)
Progress Note  Patient Name: Jenna Brown Date of Encounter: 04/22/2018  Primary Cardiologist: Glori Bickers, MD   Subjective   She denies chest pain.  She has very little shortness of breath.  Her headache is better than yesterday.  Inpatient Medications    Scheduled Meds: . amLODipine  5 mg Oral Daily  . aspirin EC  81 mg Oral Daily  . atorvastatin  80 mg Oral QHS  . carvedilol  18.75 mg Oral BID WC  . cholecalciferol  1,000 Units Oral Daily  . ezetimibe  10 mg Oral Daily  . ferrous sulfate  325 mg Oral Q breakfast  . hydrALAZINE  75 mg Oral Q8H  . insulin aspart  0-9 Units Subcutaneous TID WC  . insulin glargine  8 Units Subcutaneous QHS  . isosorbide mononitrate  120 mg Oral Daily  . sodium chloride flush  3 mL Intravenous Q12H  . ticagrelor  90 mg Oral BID   Continuous Infusions: . sodium chloride     PRN Meds: sodium chloride, acetaminophen, ALPRAZolam, docusate sodium, nitroGLYCERIN, ondansetron (ZOFRAN) IV, sodium chloride flush   Vital Signs    Vitals:   04/21/18 1718 04/21/18 2133 04/22/18 0517 04/22/18 0852  BP: (!) 156/73 (!) 152/61 130/72 (!) 153/89  Pulse: 79 78 78 78  Resp:  16 (!) 6   Temp:  98.3 F (36.8 C) 98.3 F (36.8 C)   TempSrc:  Oral Oral   SpO2:  98%    Weight:   160 lb 12.8 oz (72.9 kg)   Height:        Intake/Output Summary (Last 24 hours) at 04/22/2018 1024 Last data filed at 04/22/2018 0958 Gross per 24 hour  Intake 920 ml  Output 350 ml  Net 570 ml   Filed Weights   04/20/18 0003 04/21/18 0543 04/22/18 0517  Weight: 161 lb 4.8 oz (73.2 kg) 160 lb (72.6 kg) 160 lb 12.8 oz (72.9 kg)    Telemetry    Sinus rhythm- Personally Reviewed  ECG    NA - Personally Reviewed  Physical Exam   GEN: No acute distress.   Neck: No JVD Cardiac: RRR, no murmurs, rubs, or gallops.  Respiratory: Clear to auscultation bilaterally. GI: Soft, nontender, non-distended  MS:  Trace left leg edema.  Right AKA. Neuro:  Nonfocal  Psych:  Normal affect   Labs    Chemistry Recent Labs  Lab 04/20/18 0313 04/21/18 0829 04/22/18 0444  NA 141 139 140  K 4.8 5.0 4.7  CL 111 110 110  CO2 22 21* 23  GLUCOSE 159* 146* 146*  BUN 40* 44* 45*  CREATININE 3.65* 4.03* 4.24*  CALCIUM 8.3* 8.3* 8.3*  GFRNONAA 12* 11* 10*  GFRAA 14* 13* 12*  ANIONGAP 8 8 7      Hematology Recent Labs  Lab 04/20/18 0313 04/21/18 0829 04/22/18 0444  WBC 10.7* 7.9 8.0  RBC 2.92* 2.93* 2.74*  HGB 7.7* 7.8* 7.3*  HCT 26.2* 26.1* 24.6*  MCV 89.7 89.1 89.8  MCH 26.4 26.6 26.6  MCHC 29.4* 29.9* 29.7*  RDW 13.0 13.2 13.2  PLT 254 251 234    Cardiac Enzymes Recent Labs  Lab 04/20/18 1004 04/20/18 1829 04/21/18 0224 04/21/18 0829  TROPONINI 2.26* 1.37* 0.95* 0.76*    Recent Labs  Lab 04/19/18 1950  TROPIPOC 0.10*     BNP Recent Labs  Lab 04/20/18 0112  BNP 443.9*     DDimer No results for input(s): DDIMER in the last 168 hours.  Radiology    No results found.  Cardiac Studies   None this admission  Patient Profile     67 y.o. female with a history of extensive coronary artery disease (not a candidate for CABG due to poor targets), hypertension, chronic kidney disease,arthritis, anxiety, GERD, right AKA, hyperlipidemia, peripheral arterial disease, type 2 diabetes mellitus, and previous TIAwho presented with angina after exertional activity.  Assessment & Plan    1.  Non-STEMI: Symptomatically stable.  Troponins have trended down to 0.76 on 8/3.  Not a candidate for CABG or PCI.  Continue dual antiplatelet therapy with aspirin and Brilinta along with carvedilol, Imdur, atorvastatin, and Zetia.   2.  Hypertension: Blood pressure remains elevated.  She is on carvedilol, Imdur, and hydralazine.  I added amlodipine 5 mg daily on 8/3.  3.  Anemia: Hemoglobin down to 7.3 from 7.8 on 8/3.  4.  Chronic kidney disease stage V: Creatinine 4.24 today.  Not a candidate for PCI.  5.  Chronic systolic heart failure,  LVEF 45 to 50%: Euvolemic at present.  6.  Hyperlipidemia: Currently on atorvastatin 80 mg.  LDL 119.  Ezetimibe added.  Disposition: Possible discharge on 8/5.   For questions or updates, please contact [place] Please consult [email or web address] for contact info under Cardiology/STEMI.      Signed, Kate Sable, MD  04/22/2018, 10:24 AM

## 2018-04-22 NOTE — Progress Notes (Signed)
Patient resting comfortably during shift report. Denies complaints.  

## 2018-04-23 ENCOUNTER — Other Ambulatory Visit: Payer: Self-pay | Admitting: Cardiology

## 2018-04-23 LAB — CBC
HEMATOCRIT: 27.3 % — AB (ref 36.0–46.0)
HEMOGLOBIN: 8 g/dL — AB (ref 12.0–15.0)
MCH: 26.1 pg (ref 26.0–34.0)
MCHC: 29.3 g/dL — AB (ref 30.0–36.0)
MCV: 88.9 fL (ref 78.0–100.0)
Platelets: 271 10*3/uL (ref 150–400)
RBC: 3.07 MIL/uL — ABNORMAL LOW (ref 3.87–5.11)
RDW: 13.2 % (ref 11.5–15.5)
WBC: 9.3 10*3/uL (ref 4.0–10.5)

## 2018-04-23 LAB — BASIC METABOLIC PANEL
Anion gap: 10 (ref 5–15)
BUN: 48 mg/dL — AB (ref 8–23)
CHLORIDE: 110 mmol/L (ref 98–111)
CO2: 22 mmol/L (ref 22–32)
CREATININE: 4.2 mg/dL — AB (ref 0.44–1.00)
Calcium: 8.8 mg/dL — ABNORMAL LOW (ref 8.9–10.3)
GFR calc Af Amer: 12 mL/min — ABNORMAL LOW (ref >60)
GFR calc non Af Amer: 10 mL/min — ABNORMAL LOW (ref >60)
GLUCOSE: 125 mg/dL — AB (ref 70–99)
Potassium: 4.6 mmol/L (ref 3.5–5.1)
Sodium: 142 mmol/L (ref 135–145)

## 2018-04-23 LAB — GLUCOSE, CAPILLARY
GLUCOSE-CAPILLARY: 224 mg/dL — AB (ref 70–99)
Glucose-Capillary: 127 mg/dL — ABNORMAL HIGH (ref 70–99)

## 2018-04-23 MED ORDER — AMLODIPINE BESYLATE 5 MG PO TABS: 5.0000 mg | ORAL_TABLET | Freq: Every day | ORAL | 1 refills | Status: DC

## 2018-04-23 MED ORDER — DARBEPOETIN ALFA 40 MCG/0.4ML IJ SOSY
40.0000 ug | PREFILLED_SYRINGE | INTRAMUSCULAR | Status: DC
  Filled 2018-04-23: qty 0.4

## 2018-04-23 MED ORDER — ISOSORBIDE MONONITRATE ER 120 MG PO TB24: 120.0000 mg | ORAL_TABLET | Freq: Every day | ORAL | 1 refills | Status: DC

## 2018-04-23 MED ORDER — CARVEDILOL 12.5 MG PO TABS: 18.7500 mg | ORAL_TABLET | Freq: Two times a day (BID) | ORAL | 1 refills | Status: DC

## 2018-04-23 MED ORDER — EZETIMIBE 10 MG PO TABS: 10.0000 mg | ORAL_TABLET | Freq: Every day | ORAL | 1 refills | Status: DC

## 2018-04-23 NOTE — Discharge Summary (Signed)
Discharge Summary    Patient ID: Jenna Brown,  MRN: [id number], DOB/AGE: [birth year] 67 y.o.  Admit date: 04/19/2018 Discharge date: 04/23/2018  Primary Care Provider: [name] Primary Cardiologist: Dr. Haroldine Laws  Discharge Diagnoses    Active Problems:   Non-ST elevation (NSTEMI) myocardial infarction [facility])   Hyperlipidemia LDL goal <70   CKD (chronic kidney disease) stage 5, GFR less than 15 ml/min (HCC)   Unstable angina pectoris due to coronary arteriosclerosis ([place])   Chest pain with high risk for cardiac etiology   Coronary artery disease   Allergies Allergies  Allergen Reactions  . Plavix [Clopidogrel Bisulfate] Itching  . Codeine Other (See Comments)    "makes me feel strange"  . Tylenol With Codeine #3 [Acetaminophen-Codeine] Other (See Comments)    "doesn't make me feel right"    Diagnostic Studies/Procedures    N/a  _____________   History of Present Illness      67 y.o. female with a history of extensive coronary artery disease (not a candidate for CABG due to poor targets), hypertension, chronic kidney disease,arthritis, anxiety, GERD, right AKA, hyperlipidemia, peripheral arterial disease, type 2 diabetes mellitus, and previous TIA who presented with complaining of chest pain. The patient was out all day today with her daughter running errands. When she returned home in the afternoon she began experiencing substernal chest pain. It was associated with some shortness of breath. She had been compliant with her dual antiplatelet therapy. She denied any recent orthopnea or paroxysmal nocturnal dyspnea. She took 2 sublingual nitroglycerin with no change in her discomfort. In the emergency department her blood pressure was noted to be elevated at 196/120 mmHg. Her initial troponin was 0.10 and the ECG showed mild ST depressions with T-wave inversions in the inferolateral leads.  In the ED she was treated with intravenous labetalol and further doses of  nitroglycerin. Once her blood pressure came down her chest pain improved. She was chest pain-free at the time of exam.  Recent cardiac testing Echo 12/27/16 LVEF 45-50%, diffuse hypokinesis, Grade 1 DD and ? Mass on left atrial wall. TEE 01/03/17 45%, trivial MR and TR, no LA/LAA thrombus or mass, negative bubble study. RHC/LHC 04/14/15  Prox LAD to Mid LAD lesion, 90% stenosed. Diffuse disease  Ost Cx to Mid Cx lesion, 95% stenosed.  Ost 2nd Mrg to 2nd Mrg lesion, 80% stenosed.  Mid Cx lesion, 90% stenosed.  Prox RCA lesion, 60% stenosed.  Mid RCA lesion, 50% stenosed.  Hospital Course     1. Non-STEMI: Symptomatically stable. Troponins peaked at 2.26 and trended down to 0.76 on 8/3. Ecg with lateral T wave inversion. Not a candidate for CABG in the past given her co-morbities and poor options for targets. Reviewing her prior angiogram in 2016 she has limited options for PCI and intervention would carry a significant increased risk for ARF and need for dialysis. She will be continued dual antiplatelet therapy with aspirin and Brilinta along with carvedilol, Imdur, atorvastatin, and Zetia. Amlodipine added this admission.   2. Hypertension: Blood pressure remains mildly elevated but improved. She is on carvedilol, Imdur, and hydralazine. Amlodipine 5 mg daily on 8/3. Will continue current therapy for now.  3. Anemia: Hemoglobin down to 7.3 from 7.8 on 8/3. Now up to 8. With her severe CAD she would benefit from having Hgb up to 10. With CKD would recommend adding Aranesp to try and improve Hgb. Given a dose prior to discharge. Prior iron stores in April looked ok.  She is on oral iron.   4. Chronic kidney disease stage V: Creatinine 4.2 the day of discharge. Not a candidate for PCI. Sees nephrology in [place] but has not had recent follow up 2/2 to transportation issues. Instructed will need to arrange follow up given her declining renal function.   5. Chronic systolic heart  failure, LVEF 45 to 50%: Euvolemic at present. On PRN lasix prior to admission.   6. Hyperlipidemia: Currently on atorvastatin 80 mg. LDL 119. Ezetimibe added this admission.  Jenna Brown was seen by Dr. [place] and determined stable for discharge home. Follow up in the office has been arranged. Medications are listed below.   _____________  Discharge Vitals Blood pressure (!) 144/72, pulse 64, temperature (!) 97.3 F (36.3 C), temperature source Oral, resp. rate 18, height 5\' 4"  (1.626 m), weight 160 lb 3.2 oz (72.7 kg), SpO2 99 %.  Filed Weights   04/21/18 0543 04/22/18 0517 04/23/18 0429  Weight: 160 lb (72.6 kg) 160 lb 12.8 oz (72.9 kg) 160 lb 3.2 oz (72.7 kg)    Labs & Radiologic Studies    CBC Recent Labs    04/22/18 0444 04/23/18 0608  WBC 8.0 9.3  HGB 7.3* 8.0*  HCT 24.6* 27.3*  MCV 89.8 88.9  PLT 234 485   Basic Metabolic Panel Recent Labs    04/22/18 0444 04/23/18 0608  NA 140 142  K 4.7 4.6  CL 110 110  CO2 23 22  GLUCOSE 146* 125*  BUN 45* 48*  CREATININE 4.24* 4.20*  CALCIUM 8.3* 8.8*   Liver Function Tests No results for input(s): AST, ALT, ALKPHOS, BILITOT, PROT, ALBUMIN in the last 72 hours. No results for input(s): LIPASE, AMYLASE in the last 72 hours. Cardiac Enzymes Recent Labs    04/20/18 1829 04/21/18 0224 04/21/18 0829  TROPONINI 1.37* 0.95* 0.76*   BNP Invalid input(s): POCBNP D-Dimer No results for input(s): DDIMER in the last 72 hours. Hemoglobin A1C No results for input(s): HGBA1C in the last 72 hours. Fasting Lipid Panel No results for input(s): CHOL, HDL, LDLCALC, TRIG, CHOLHDL, LDLDIRECT in the last 72 hours. Thyroid Function Tests No results for input(s): TSH, T4TOTAL, T3FREE, THYROIDAB in the last 72 hours.  Invalid input(s): FREET3 _____________  Dg Chest 2 View  Result Date: 04/19/2018 CLINICAL DATA:  Chest pain EXAM: CHEST - 2 VIEW COMPARISON:  01/11/2018 FINDINGS: Mild cardiomegaly. No focal consolidation or  effusion. No pneumothorax. Degenerative changes of the spine. IMPRESSION: No active cardiopulmonary disease.  Cardiomegaly. Electronically Signed   By: Donavan Foil M.D.   On: 04/19/2018 20:20   Disposition   Pt is being discharged home today in good condition.  Follow-up Plans & Appointments    Follow-up Information    Conrad Pacolet, NP Follow up on 05/07/2018.   Specialty:  Cardiology Why:  at 11:30am for your follow up appt.  Contact information: [address] [place] [address] [phone]        [name], MD Follow up.   Specialty:  Internal Medicine Why:  Please arrange follow up within 2 weeks.  Contact information: [place] [address] [phone]        Cay Schillings, MD Follow up.   Specialty:  Unknown Physician Specialty Why:  Please arrange follow up with your nephrologist Contact information: [address] [place] [address] [phone]          Discharge Instructions    Diet - low sodium  heart healthy   Complete by:  As directed    Discharge instructions   Complete by:  As directed    For patients with congestive heart failure, we give them these special instructions:  1. Follow a low-salt diet and watch your fluid intake. In general, you should not be taking in more than 2 liters of fluid per day (no more than 8 glasses per day). Some patients are restricted to less than 1.5 liters of fluid per day (no more than 6 glasses per day). This includes sources of water in foods like soup, coffee, tea, milk, etc. 2. Weigh yourself on the same scale at same time of day and keep a log. 3. Call your doctor: (Anytime you feel any of the following symptoms)  - 3-4 pound weight gain in 1-2 days or 2 pounds overnight  - Shortness of breath, with or without a dry hacking cough  - Swelling in the hands, feet or stomach  - If you have to sleep on extra pillows at night in order to breathe   IT IS IMPORTANT TO  LET YOUR DOCTOR KNOW EARLY ON IF YOU ARE HAVING SYMPTOMS SO WE CAN HELP YOU!   Increase activity slowly   Complete by:  As directed      Discharge Medications     Medication List    TAKE these medications   acetaminophen 650 MG CR tablet Commonly known as:  TYLENOL Take 650 mg by mouth every 8 (eight) hours as needed for pain.   ALPRAZolam 0.25 MG tablet Commonly known as:  XANAX Take 1 tablet (0.25 mg total) by mouth 3 (three) times daily as needed for anxiety.   amLODipine 5 MG tablet Commonly known as:  NORVASC Take 1 tablet (5 mg total) by mouth daily. Start taking on:  04/24/2018   aspirin 81 MG chewable tablet Chew 81 mg by mouth daily.   atorvastatin 80 MG tablet Commonly known as:  LIPITOR Take 80 mg by mouth at bedtime.   carvedilol 12.5 MG tablet Commonly known as:  COREG Take 1.5 tablets (18.75 mg total) by mouth 2 (two) times daily with a meal. What changed:  how much to take   cholecalciferol 1000 units tablet Commonly known as:  VITAMIN D Take 1,000 Units by mouth daily.   ezetimibe 10 MG tablet Commonly known as:  ZETIA Take 1 tablet (10 mg total) by mouth daily. Start taking on:  04/24/2018   ferrous sulfate 325 (65 FE) MG tablet Take 325 mg by mouth once a week.   furosemide 40 MG tablet Commonly known as:  LASIX Take 1 tablet (40 mg total) by mouth daily as needed. What changed:  reasons to take this   hydrALAZINE 50 MG tablet Commonly known as:  APRESOLINE Take 1.5 tablets (75 mg total) by mouth 3 (three) times daily. What changed:  when to take this   insulin glargine 100 UNIT/ML injection Commonly known as:  LANTUS Inject 0.25 mLs (25 Units total) into the skin 2 (two) times daily. What changed:  when to take this   insulin lispro 100 UNIT/ML injection Commonly known as:  HUMALOG Inject 4 Units into the skin See admin instructions. Below 110 do not take it. 110-115 and above take 4 units   isosorbide mononitrate 120 MG 24 hr  tablet Commonly known as:  IMDUR Take 1 tablet (120 mg total) by mouth daily. Start taking on:  04/24/2018 What changed:    medication strength  how much to  take   nitroGLYCERIN 0.4 MG SL tablet Commonly known as:  NITROSTAT Place 1 tablet (0.4 mg total) under the tongue every 5 (five) minutes x 3 doses as needed for chest pain.   ticagrelor 90 MG Tabs tablet Commonly known as:  BRILINTA Take 1 tablet (90 mg total) by mouth 2 (two) times daily.       Acute coronary syndrome (MI, NSTEMI, STEMI, etc) this admission?: No.     Outstanding Labs/Studies   CBC/BMET at follow up appt.   Duration of Discharge Encounter   Greater than 30 minutes including physician time.  Signed, Reino Bellis NP-C 04/23/2018, 1:18 PM

## 2018-04-23 NOTE — Care Management Note (Signed)
Case Management Note  Patient Details  Name: Jenna Brown MRN: [id number] Date of Birth: [birth year]  Subjective/Objective:   [place]                Action/Plan: Patient lives at home; PCP is [name], MD; has private insurance with Medicare/ Medicaid; pharmacy of choice is [facility], patient reports no problem getting her medication.DME - wheelchair; her sister and daughter takes her to her apts; Patient could benefit from a Disease Management Program for CHF; [place] choice offered, pt chose [facility]; Dan with [facility] made aware.  Expected Discharge Date:  04/23/18               Expected Discharge Plan:  [place]  Discharge planning Services  CM Consult  Choice offered to:  Patient  HH Arranged:  RN, Disease Management [place] Agency:  [place]  Status of Service:  In process, will continue to follow  Sherrilyn Rist [phone] 04/23/2018, 1:56 PM

## 2018-04-23 NOTE — Telephone Encounter (Signed)
This is a CHF pt 

## 2018-04-23 NOTE — Plan of Care (Signed)

## 2018-04-23 NOTE — Progress Notes (Signed)
Progress Note  Patient Name: Jenna Brown Date of Encounter: 04/23/2018  Primary Cardiologist: Glori Bickers, MD   Subjective   She feels well today. Concerned her blood sugar was up after eating pancakes this morning.  She has very little shortness of breath.    Inpatient Medications    Scheduled Meds: . amLODipine  5 mg Oral Daily  . aspirin EC  81 mg Oral Daily  . atorvastatin  80 mg Oral QHS  . carvedilol  18.75 mg Oral BID WC  . cholecalciferol  1,000 Units Oral Daily  . ezetimibe  10 mg Oral Daily  . ferrous sulfate  325 mg Oral Q breakfast  . hydrALAZINE  75 mg Oral Q8H  . insulin aspart  0-9 Units Subcutaneous TID WC  . insulin glargine  8 Units Subcutaneous QHS  . isosorbide mononitrate  120 mg Oral Daily  . sodium chloride flush  3 mL Intravenous Q12H  . ticagrelor  90 mg Oral BID   Continuous Infusions: . sodium chloride     PRN Meds: sodium chloride, acetaminophen, ALPRAZolam, docusate sodium, nitroGLYCERIN, ondansetron (ZOFRAN) IV, sodium chloride flush   Vital Signs    Vitals:   04/23/18 0429 04/23/18 0612 04/23/18 0827 04/23/18 1151  BP: 136/66 (!) 152/70 (!) 151/65 (!) 144/72  Pulse: 67 71 67 64  Resp: 18     Temp: (!) 97.5 F (36.4 C)   (!) 97.3 F (36.3 C)  TempSrc: Oral   Oral  SpO2: 99% 100%  99%  Weight: 160 lb 3.2 oz (72.7 kg)     Height:        Intake/Output Summary (Last 24 hours) at 04/23/2018 1205 Last data filed at 04/23/2018 0830 Gross per 24 hour  Intake 963 ml  Output 1000 ml  Net -37 ml   Filed Weights   04/21/18 0543 04/22/18 0517 04/23/18 0429  Weight: 160 lb (72.6 kg) 160 lb 12.8 oz (72.9 kg) 160 lb 3.2 oz (72.7 kg)    Telemetry    Sinus rhythm- Personally Reviewed  ECG    NA - Personally Reviewed  Physical Exam   GEN: No acute distress.   Neck: No JVD Cardiac: RRR, no murmurs, rubs, or gallops.  Respiratory: Clear to auscultation bilaterally. GI: Soft, nontender, non-distended  MS:  Trace left leg edema.   Right AKA. Neuro:  Nonfocal  Psych: Normal affect   Labs    Chemistry Recent Labs  Lab 04/21/18 0829 04/22/18 0444 04/23/18 0608  NA 139 140 142  K 5.0 4.7 4.6  CL 110 110 110  CO2 21* 23 22  GLUCOSE 146* 146* 125*  BUN 44* 45* 48*  CREATININE 4.03* 4.24* 4.20*  CALCIUM 8.3* 8.3* 8.8*  GFRNONAA 11* 10* 10*  GFRAA 13* 12* 12*  ANIONGAP 8 7 10      Hematology Recent Labs  Lab 04/21/18 0829 04/22/18 0444 04/23/18 0608  WBC 7.9 8.0 9.3  RBC 2.93* 2.74* 3.07*  HGB 7.8* 7.3* 8.0*  HCT 26.1* 24.6* 27.3*  MCV 89.1 89.8 88.9  MCH 26.6 26.6 26.1  MCHC 29.9* 29.7* 29.3*  RDW 13.2 13.2 13.2  PLT 251 234 271    Cardiac Enzymes Recent Labs  Lab 04/20/18 1004 04/20/18 1829 04/21/18 0224 04/21/18 0829  TROPONINI 2.26* 1.37* 0.95* 0.76*    Recent Labs  Lab 04/19/18 1950  TROPIPOC 0.10*     BNP Recent Labs  Lab 04/20/18 0112  BNP 443.9*     DDimer No results for input(s):  DDIMER in the last 168 hours.   Radiology    No results found.  Cardiac Studies   None this admission  Patient Profile     67 y.o. female with a history of extensive coronary artery disease (not a candidate for CABG due to poor targets), hypertension, chronic kidney disease, anemia, arthritis, anxiety, GERD, right AKA, hyperlipidemia, peripheral arterial disease, type 2 diabetes mellitus, and previous TIAwho presented with angina after exertional activity.  Assessment & Plan    1.  Non-STEMI: Symptomatically stable.  Troponins have trended down to 0.76 on 8/3. Ecg with lateral T wave inversion.  Not a candidate for CABG. Reviewing her prior angiogram in 2016 she has limited options for PCI and intervention would carry a significant increased risk for ARF and need for dialysis.  Continue dual antiplatelet therapy with aspirin and Brilinta along with carvedilol, Imdur, atorvastatin, and Zetia. Amlodipine added this admission.   2.  Hypertension: Blood pressure remains mildly elevated  but improved.  She is on carvedilol, Imdur, and hydralazine. Amlodipine 5 mg daily on 8/3. Will continue current therapy for now.  3.  Anemia: Hemoglobin down to 7.3 from 7.8 on 8/3. Now up to 8. With her severe CAD she would benefit from having Hgb up to 10. With CKD would recommend adding Aranesp to try and improve Hgb. Prior iron stores in April looked [place]. She is on oral iron.   4.  Chronic kidney disease stage V: Creatinine 4.2 today.  Not a candidate for PCI.  5.  Chronic systolic heart failure, LVEF 45 to 50%: Euvolemic at present.  6.  Hyperlipidemia: Currently on atorvastatin 80 mg.  LDL 119.  Ezetimibe added.  Disposition: Patient appears stable for DC today. Will need close follow up with primary care and renal- see Dr. Joesph July in [place] but hasn't seen since last year. Already has follow up in CHF clinic on August 19.    For questions or updates, please contact [place] Please consult [email or web address] for contact info under Cardiology/STEMI.      Signed, Aquan Kope Martinique, MD  04/23/2018, 12:05 PM

## 2018-04-23 NOTE — Progress Notes (Signed)
Pt discharge instructions reviewed with pt. Pt verbalizes understanding and states she has no questions. Pt belongings with pt. Pt is not in distress. Pt's daughter is driving her home.

## 2018-04-25 ENCOUNTER — Encounter (HOSPITAL_COMMUNITY): Payer: Medicare Other

## 2018-04-30 DIAGNOSIS — Z87891 Personal history of nicotine dependence: Secondary | ICD-10-CM | POA: Diagnosis not present

## 2018-04-30 DIAGNOSIS — I214 Non-ST elevation (NSTEMI) myocardial infarction: Secondary | ICD-10-CM | POA: Diagnosis not present

## 2018-04-30 DIAGNOSIS — E1151 Type 2 diabetes mellitus with diabetic peripheral angiopathy without gangrene: Secondary | ICD-10-CM | POA: Diagnosis not present

## 2018-04-30 DIAGNOSIS — M1990 Unspecified osteoarthritis, unspecified site: Secondary | ICD-10-CM | POA: Diagnosis not present

## 2018-04-30 DIAGNOSIS — D631 Anemia in chronic kidney disease: Secondary | ICD-10-CM | POA: Diagnosis not present

## 2018-04-30 DIAGNOSIS — E1122 Type 2 diabetes mellitus with diabetic chronic kidney disease: Secondary | ICD-10-CM | POA: Diagnosis not present

## 2018-04-30 DIAGNOSIS — Z89611 Acquired absence of right leg above knee: Secondary | ICD-10-CM | POA: Diagnosis not present

## 2018-04-30 DIAGNOSIS — I5022 Chronic systolic (congestive) heart failure: Secondary | ICD-10-CM | POA: Diagnosis not present

## 2018-04-30 DIAGNOSIS — I25110 Atherosclerotic heart disease of native coronary artery with unstable angina pectoris: Secondary | ICD-10-CM | POA: Diagnosis not present

## 2018-04-30 DIAGNOSIS — Z794 Long term (current) use of insulin: Secondary | ICD-10-CM | POA: Diagnosis not present

## 2018-04-30 DIAGNOSIS — E1165 Type 2 diabetes mellitus with hyperglycemia: Secondary | ICD-10-CM | POA: Diagnosis not present

## 2018-04-30 DIAGNOSIS — I130 Hypertensive heart and chronic kidney disease with heart failure and stage 1 through stage 4 chronic kidney disease, or unspecified chronic kidney disease: Secondary | ICD-10-CM | POA: Diagnosis not present

## 2018-04-30 DIAGNOSIS — F419 Anxiety disorder, unspecified: Secondary | ICD-10-CM | POA: Diagnosis not present

## 2018-04-30 DIAGNOSIS — N185 Chronic kidney disease, stage 5: Secondary | ICD-10-CM | POA: Diagnosis not present

## 2018-04-30 DIAGNOSIS — K219 Gastro-esophageal reflux disease without esophagitis: Secondary | ICD-10-CM | POA: Diagnosis not present

## 2018-04-30 DIAGNOSIS — Z7982 Long term (current) use of aspirin: Secondary | ICD-10-CM | POA: Diagnosis not present

## 2018-04-30 DIAGNOSIS — Z8673 Personal history of transient ischemic attack (TIA), and cerebral infarction without residual deficits: Secondary | ICD-10-CM | POA: Diagnosis not present

## 2018-04-30 DIAGNOSIS — E785 Hyperlipidemia, unspecified: Secondary | ICD-10-CM | POA: Diagnosis not present

## 2018-05-01 ENCOUNTER — Telehealth (HOSPITAL_COMMUNITY): Payer: Self-pay

## 2018-05-01 NOTE — Telephone Encounter (Signed)
Patty from [facility] called to inform you that pt's bp standing was 214/90 and sitting 160/90. Patty states this was before pt took her meds. Chong Sicilian advises that pt should keep their appt for 08/19.

## 2018-05-01 NOTE — Telephone Encounter (Signed)
Can increase Hydralazine to 100 mg TID.    Legrand Como [address] [place], PA-C 05/01/2018 4:24 PM

## 2018-05-02 DIAGNOSIS — N186 End stage renal disease: Secondary | ICD-10-CM | POA: Diagnosis not present

## 2018-05-02 DIAGNOSIS — I10 Essential (primary) hypertension: Secondary | ICD-10-CM | POA: Diagnosis not present

## 2018-05-02 DIAGNOSIS — R75 Inconclusive laboratory evidence of human immunodeficiency virus [HIV]: Secondary | ICD-10-CM | POA: Diagnosis not present

## 2018-05-02 DIAGNOSIS — N184 Chronic kidney disease, stage 4 (severe): Secondary | ICD-10-CM | POA: Diagnosis not present

## 2018-05-02 DIAGNOSIS — D649 Anemia, unspecified: Secondary | ICD-10-CM | POA: Diagnosis not present

## 2018-05-02 DIAGNOSIS — E119 Type 2 diabetes mellitus without complications: Secondary | ICD-10-CM | POA: Diagnosis not present

## 2018-05-02 MED ORDER — HYDRALAZINE HCL 100 MG PO TABS: 100.0000 mg | ORAL_TABLET | Freq: Three times a day (TID) | ORAL | 3 refills | Status: DC

## 2018-05-02 NOTE — Telephone Encounter (Signed)
Patty at [facility] notified and verbalizes understanding.

## 2018-05-02 NOTE — Telephone Encounter (Signed)
Med list updated, new rx sent to pharmacy

## 2018-05-03 DIAGNOSIS — E1122 Type 2 diabetes mellitus with diabetic chronic kidney disease: Secondary | ICD-10-CM | POA: Diagnosis not present

## 2018-05-03 DIAGNOSIS — I5022 Chronic systolic (congestive) heart failure: Secondary | ICD-10-CM | POA: Diagnosis not present

## 2018-05-03 DIAGNOSIS — I25110 Atherosclerotic heart disease of native coronary artery with unstable angina pectoris: Secondary | ICD-10-CM | POA: Diagnosis not present

## 2018-05-03 DIAGNOSIS — I214 Non-ST elevation (NSTEMI) myocardial infarction: Secondary | ICD-10-CM | POA: Diagnosis not present

## 2018-05-03 DIAGNOSIS — N185 Chronic kidney disease, stage 5: Secondary | ICD-10-CM | POA: Diagnosis not present

## 2018-05-03 DIAGNOSIS — I130 Hypertensive heart and chronic kidney disease with heart failure and stage 1 through stage 4 chronic kidney disease, or unspecified chronic kidney disease: Secondary | ICD-10-CM | POA: Diagnosis not present

## 2018-05-04 DIAGNOSIS — E785 Hyperlipidemia, unspecified: Secondary | ICD-10-CM | POA: Diagnosis not present

## 2018-05-04 DIAGNOSIS — D638 Anemia in other chronic diseases classified elsewhere: Secondary | ICD-10-CM | POA: Diagnosis not present

## 2018-05-04 DIAGNOSIS — I10 Essential (primary) hypertension: Secondary | ICD-10-CM | POA: Diagnosis not present

## 2018-05-04 DIAGNOSIS — N183 Chronic kidney disease, stage 3 (moderate): Secondary | ICD-10-CM | POA: Diagnosis not present

## 2018-05-04 DIAGNOSIS — Z89611 Acquired absence of right leg above knee: Secondary | ICD-10-CM | POA: Diagnosis not present

## 2018-05-04 DIAGNOSIS — E119 Type 2 diabetes mellitus without complications: Secondary | ICD-10-CM | POA: Diagnosis not present

## 2018-05-04 DIAGNOSIS — Z0000 Encounter for general adult medical examination without abnormal findings: Secondary | ICD-10-CM | POA: Diagnosis not present

## 2018-05-04 DIAGNOSIS — I119 Hypertensive heart disease without heart failure: Secondary | ICD-10-CM | POA: Diagnosis not present

## 2018-05-04 DIAGNOSIS — E559 Vitamin D deficiency, unspecified: Secondary | ICD-10-CM | POA: Diagnosis not present

## 2018-05-04 DIAGNOSIS — R1013 Epigastric pain: Secondary | ICD-10-CM | POA: Diagnosis not present

## 2018-05-04 DIAGNOSIS — E538 Deficiency of other specified B group vitamins: Secondary | ICD-10-CM | POA: Diagnosis not present

## 2018-05-07 ENCOUNTER — Encounter (HOSPITAL_COMMUNITY): Payer: Medicare Other

## 2018-05-08 DIAGNOSIS — N185 Chronic kidney disease, stage 5: Secondary | ICD-10-CM | POA: Diagnosis not present

## 2018-05-08 DIAGNOSIS — I5022 Chronic systolic (congestive) heart failure: Secondary | ICD-10-CM | POA: Diagnosis not present

## 2018-05-08 DIAGNOSIS — I25110 Atherosclerotic heart disease of native coronary artery with unstable angina pectoris: Secondary | ICD-10-CM | POA: Diagnosis not present

## 2018-05-08 DIAGNOSIS — I214 Non-ST elevation (NSTEMI) myocardial infarction: Secondary | ICD-10-CM | POA: Diagnosis not present

## 2018-05-08 DIAGNOSIS — E1122 Type 2 diabetes mellitus with diabetic chronic kidney disease: Secondary | ICD-10-CM | POA: Diagnosis not present

## 2018-05-08 DIAGNOSIS — I130 Hypertensive heart and chronic kidney disease with heart failure and stage 1 through stage 4 chronic kidney disease, or unspecified chronic kidney disease: Secondary | ICD-10-CM | POA: Diagnosis not present

## 2018-05-10 DIAGNOSIS — E1122 Type 2 diabetes mellitus with diabetic chronic kidney disease: Secondary | ICD-10-CM | POA: Diagnosis not present

## 2018-05-10 DIAGNOSIS — I130 Hypertensive heart and chronic kidney disease with heart failure and stage 1 through stage 4 chronic kidney disease, or unspecified chronic kidney disease: Secondary | ICD-10-CM | POA: Diagnosis not present

## 2018-05-10 DIAGNOSIS — I5022 Chronic systolic (congestive) heart failure: Secondary | ICD-10-CM | POA: Diagnosis not present

## 2018-05-10 DIAGNOSIS — N185 Chronic kidney disease, stage 5: Secondary | ICD-10-CM | POA: Diagnosis not present

## 2018-05-10 DIAGNOSIS — I214 Non-ST elevation (NSTEMI) myocardial infarction: Secondary | ICD-10-CM | POA: Diagnosis not present

## 2018-05-10 DIAGNOSIS — I25110 Atherosclerotic heart disease of native coronary artery with unstable angina pectoris: Secondary | ICD-10-CM | POA: Diagnosis not present

## 2018-05-11 ENCOUNTER — Ambulatory Visit (HOSPITAL_COMMUNITY)
Admission: RE | Admit: 2018-05-11 | Discharge: 2018-05-11 | Disposition: A | Discharge status: Home / Self Care | Payer: Medicare Other | Source: Ambulatory Visit | Attending: Internal Medicine | Admitting: Internal Medicine

## 2018-05-11 ENCOUNTER — Encounter (HOSPITAL_COMMUNITY): Payer: Self-pay

## 2018-05-11 VITALS — BP 128/64 | HR 58 | Wt 160.6 lb

## 2018-05-11 DIAGNOSIS — N184 Chronic kidney disease, stage 4 (severe): Secondary | ICD-10-CM

## 2018-05-11 DIAGNOSIS — Z794 Long term (current) use of insulin: Secondary | ICD-10-CM | POA: Diagnosis not present

## 2018-05-11 DIAGNOSIS — Z8673 Personal history of transient ischemic attack (TIA), and cerebral infarction without residual deficits: Secondary | ICD-10-CM | POA: Insufficient documentation

## 2018-05-11 DIAGNOSIS — Z89611 Acquired absence of right leg above knee: Secondary | ICD-10-CM | POA: Diagnosis not present

## 2018-05-11 DIAGNOSIS — Z7982 Long term (current) use of aspirin: Secondary | ICD-10-CM | POA: Insufficient documentation

## 2018-05-11 DIAGNOSIS — Z780 Asymptomatic menopausal state: Secondary | ICD-10-CM | POA: Insufficient documentation

## 2018-05-11 DIAGNOSIS — I252 Old myocardial infarction: Secondary | ICD-10-CM | POA: Insufficient documentation

## 2018-05-11 DIAGNOSIS — M1990 Unspecified osteoarthritis, unspecified site: Secondary | ICD-10-CM | POA: Insufficient documentation

## 2018-05-11 DIAGNOSIS — Z87891 Personal history of nicotine dependence: Secondary | ICD-10-CM | POA: Insufficient documentation

## 2018-05-11 DIAGNOSIS — I5022 Chronic systolic (congestive) heart failure: Secondary | ICD-10-CM | POA: Diagnosis not present

## 2018-05-11 DIAGNOSIS — Z79899 Other long term (current) drug therapy: Secondary | ICD-10-CM | POA: Insufficient documentation

## 2018-05-11 DIAGNOSIS — E1122 Type 2 diabetes mellitus with diabetic chronic kidney disease: Secondary | ICD-10-CM | POA: Insufficient documentation

## 2018-05-11 DIAGNOSIS — N185 Chronic kidney disease, stage 5: Secondary | ICD-10-CM | POA: Insufficient documentation

## 2018-05-11 DIAGNOSIS — I2510 Atherosclerotic heart disease of native coronary artery without angina pectoris: Secondary | ICD-10-CM | POA: Insufficient documentation

## 2018-05-11 DIAGNOSIS — Z993 Dependence on wheelchair: Secondary | ICD-10-CM | POA: Insufficient documentation

## 2018-05-11 DIAGNOSIS — I10 Essential (primary) hypertension: Secondary | ICD-10-CM | POA: Diagnosis not present

## 2018-05-11 DIAGNOSIS — Z833 Family history of diabetes mellitus: Secondary | ICD-10-CM | POA: Diagnosis not present

## 2018-05-11 DIAGNOSIS — Z803 Family history of malignant neoplasm of breast: Secondary | ICD-10-CM | POA: Insufficient documentation

## 2018-05-11 DIAGNOSIS — E785 Hyperlipidemia, unspecified: Secondary | ICD-10-CM | POA: Insufficient documentation

## 2018-05-11 DIAGNOSIS — I255 Ischemic cardiomyopathy: Secondary | ICD-10-CM

## 2018-05-11 DIAGNOSIS — I132 Hypertensive heart and chronic kidney disease with heart failure and with stage 5 chronic kidney disease, or end stage renal disease: Secondary | ICD-10-CM | POA: Insufficient documentation

## 2018-05-11 DIAGNOSIS — E1151 Type 2 diabetes mellitus with diabetic peripheral angiopathy without gangrene: Secondary | ICD-10-CM | POA: Insufficient documentation

## 2018-05-11 DIAGNOSIS — F419 Anxiety disorder, unspecified: Secondary | ICD-10-CM | POA: Insufficient documentation

## 2018-05-11 DIAGNOSIS — K219 Gastro-esophageal reflux disease without esophagitis: Secondary | ICD-10-CM | POA: Insufficient documentation

## 2018-05-11 NOTE — Progress Notes (Signed)
Advanced Heart Failure Clinic Note   Patient ID: Jenna Brown, female   DOB: 07-25-1951, 67 y.o.   MRN: [id number]   PCP: Dr. Vista Lawman Primary HF Cardiologist: Dr Haroldine Laws Renal:  Hulen Shouts [phone])   HPI: Jenna Brown is a 67 y.o. female with hyperlipidemia, CVA, hypertension, type 2 diabetes mellitus, severe CAD, right AKA .   Admitted to [facility] 04/10/15 with increased dyspnea. Had RHC/LHC as noted below. Not candidate for CABG per Dr Prescott Gum due to comorbidities and poor targets. Required short term milrinone for cardiogenic shock. As she improved and fully diuresed, milrinone stopped. She had recommendations for lasix, spiro, losartan, hydralazine, and imdur.  Discharge weight was 158 pounds.     Admitted 4/8 -> 01/03/2017 with hypertensive emergency and AKI. BP up to 225/98 Troponin max to 4.62 felt to be demand ischemia. Started on Nitro + heparin gtt. Lasix and losartan held in AKI.  Renal consulted. Medications heavily adjusted.   Admitted 03/23/17 - 03/30/17 with NSTEMI. She had not been taking her Brilinta correctly. Decision was made to treat her medically as it was felt that she was at high risk of CIN with her CKD. Her isosorbide was increased to 90 mg daily. Her chest pain improved with increased isosorbide, creatinine was as high as 3.33. Her losartan was restarted at discharge after AKI resolved.   Admitted 01/11/18 with chest pain with NSTEMI. This was worse than her typical chest pain, which she gets 1-2 times weekly. Peak troponin 10.54. Not taken for cath with CKD IV-V. Treated with IV nitro and heparin. Not candidate for CABG per Dr. Prescott Gum.  Meds adjusted and pt remained asymptomatic for the remainder of visit.   Admitted 04/2018 with increased dyspnea and chest pain. NSTEMI . Troponin peaked 2.26. Reviewed by Dr Martinique with recommendations for ongoing medical therapy. Creatinine on the day discharge was 4.2.   Today she returns for HF follow up. Overall feeling  fine. Denies SOB/PND/Orthopnea. No chest pain. She is wheel chair bound. Unable to use prosthetic. Appetite ok. No fever or chills. No BRBPR.  Weight at home 158 pounds. Says she takes lasix 2-3 times a week. Taking all medications.    Echo 12/27/16 LVEF 45-50%, diffuse hypokinesis, Grade 1 DD and ? Mass on left atrial wall. TEE 01/03/17 45%, trivial MR and TR, no LA/LAA thrombus or mass, negative bubble study.  Echo 03/2017 Left ventricle: LVEF is approximately 45 to 50% with mild   hypokinesis of the distal inferior/inferoseptal and apical walls.   The cavity size was normal. Wall thickness was increased in a   pattern of mild LVH. Doppler parameters are consistent with   abnormal left ventricular relaxation (grade 1 diastolic   dysfunction). - Aortic valve: AV is thickened, calcified In the parasternal view   there is an nodular echo bright segment (18 x 7 mm) May represent   nodular thickening of valve Not convincing for discrete mass. It   appears to be present on TTE in April 2018 TEE after that showed   no abnormalitiy to AV. - Mitral valve: Calcified annulus. Mildly thickened leaflets   RHC/LHC 04/14/15  Prox LAD to Mid LAD lesion, 90% stenosed. Diffuse disease  Ost Cx to Mid Cx lesion, 95% stenosed.  Ost 2nd Mrg to 2nd Mrg lesion, 80% stenosed.  Mid Cx lesion, 90% stenosed.  Prox RCA lesion, 60% stenosed.  Mid RCA lesion, 50% stenosed.  Review of systems complete and found to be negative unless listed  in HPI.   SH:  Social History   Socioeconomic History  . Marital status: Single    Spouse name: Not on file  . Number of children: Not on file  . Years of education: Not on file  . Highest education level: Not on file  Occupational History  . Occupation: disabled  Social Needs  . Financial resource strain: Not on file  . Food insecurity:    Worry: Not on file    Inability: Not on file  . Transportation needs:    Medical: Not on file    Non-medical: Not on file    Tobacco Use  . Smoking status: Former Smoker    Packs/day: 0.25    Years: 40.00    Pack years: 10.00    Types: Cigarettes    Last attempt to quit: 02/27/2012    Years since quitting: 6.2  . Smokeless tobacco: Never Used  Substance and Sexual Activity  . Alcohol use: No  . Drug use: No  . Sexual activity: Never    Birth control/protection: Post-menopausal  Lifestyle  . Physical activity:    Days per week: Not on file    Minutes per session: Not on file  . Stress: Not on file  Relationships  . Social connections:    Talks on phone: Not on file    Gets together: Not on file    Attends religious service: Not on file    Active member of club or organization: Not on file    Attends meetings of clubs or organizations: Not on file    Relationship status: Not on file  . Intimate partner violence:    Fear of current or ex partner: Not on file    Emotionally abused: Not on file    Physically abused: Not on file    Forced sexual activity: Not on file  Other Topics Concern  . Not on file  Social History Narrative   She lives in [place] with family.   FH:  Family History  Problem Relation Age of Onset  . Cancer Mother        breast  . Diabetes Father     Past Medical History:  Diagnosis Date  . Anemia   . Anxiety    takes Xanax prn  . Arthritis   . CKD (chronic kidney disease), stage IV ([place])   . Coronary artery disease 2016   cath w/ 90% LAD, 95%CFX, 80% OM 2, 60% RCA, not CABG candidate, rx medically  . GERD (gastroesophageal reflux disease)   . H/O hiatal hernia   . Headache(784.0)    when b/p is elevated  . Hemorrhoids   . Hx of AKA (above knee amputation), right ([place])   . Hyperlipidemia    takes Simvastatin daily  . Hypertension    takes Amlodipine/HCTZ/Losartan daily  . Migraines   . Myocardial infarction ([place]) 1990's  . NSTEMI (non-ST elevated myocardial infarction) ([place])   . Peripheral vascular disease ([place])   . PONV (postoperative nausea and vomiting)    . Stroke [facility])    TIA history  . Type II diabetes mellitus ([facility])    on Lantus  . Vertigo    takes Ativert prn    Current Outpatient Medications  Medication Sig Dispense Refill  . acetaminophen (TYLENOL) 650 MG CR tablet Take 650 mg by mouth every 8 (eight) hours as needed for pain.     Marland Kitchen ALPRAZolam (XANAX) 0.25 MG tablet Take 1 tablet (0.25 mg total) by mouth 3 (three)  times daily as needed for anxiety. 12 tablet 0  . amLODipine (NORVASC) 5 MG tablet Take 1 tablet (5 mg total) by mouth daily. 30 tablet 1  . aspirin 81 MG chewable tablet Chew 81 mg by mouth daily.    Marland Kitchen atorvastatin (LIPITOR) 80 MG tablet Take 80 mg by mouth at bedtime.    . carvedilol (COREG) 12.5 MG tablet Take 1.5 tablets (18.75 mg total) by mouth 2 (two) times daily with a meal. 120 tablet 1  . cholecalciferol (VITAMIN D) 1000 units tablet Take 1,000 Units by mouth daily.    Marland Kitchen ezetimibe (ZETIA) 10 MG tablet Take 1 tablet (10 mg total) by mouth daily. 30 tablet 1  . ferrous sulfate 325 (65 FE) MG tablet Take 325 mg by mouth once a week.     . furosemide (LASIX) 40 MG tablet Take 1 tablet (40 mg total) by mouth daily as needed. (Patient taking differently: Take 40 mg by mouth daily as needed for fluid or edema. ) 30 tablet 11  . hydrALAZINE (APRESOLINE) 100 MG tablet Take 1 tablet (100 mg total) by mouth 3 (three) times daily. 90 tablet 3  . insulin glargine (LANTUS) 100 UNIT/ML injection Inject 0.25 mLs (25 Units total) into the skin 2 (two) times daily. (Patient taking differently: Inject 25 Units into the skin at bedtime. ) 10 mL 11  . insulin lispro (HUMALOG) 100 UNIT/ML injection Inject 4 Units into the skin See admin instructions. Below 110 do not take it. 110-115 and above take 4 units    . isosorbide mononitrate (IMDUR) 120 MG 24 hr tablet Take 1 tablet (120 mg total) by mouth daily. 30 tablet 1  . nitroGLYCERIN (NITROSTAT) 0.4 MG SL tablet Place 1 tablet (0.4 mg total) under the tongue every 5 (five) minutes x 3  doses as needed for chest pain. 25 tablet 3  . ticagrelor (BRILINTA) 90 MG TABS tablet Take 1 tablet (90 mg total) by mouth 2 (two) times daily. 60 tablet 11   No current facility-administered medications for this encounter.    Vitals:   05/11/18 0912  BP: 128/64  Pulse: (!) 58  SpO2: 98%  Weight: 72.8 kg (160 lb 9.6 oz)    Wt Readings from Last 3 Encounters:  05/11/18 72.8 kg (160 lb 9.6 oz)  04/23/18 72.7 kg (160 lb 3.2 oz)  02/27/18 72.1 kg (159 lb)    PHYSICAL EXAM: General:  Well appearing. No resp difficulty. In wheel chair.  HEENT: normal Neck: supple. JVD 7-8 . Carotids 2+ bilat; no bruits. No lymphadenopathy or thryomegaly appreciated. Cor: PMI nondisplaced. Regular rate & rhythm. No rubs, gallops or murmurs. Lungs: clear Abdomen: soft, nontender, nondistended. No hepatosplenomegaly. No bruits or masses. Good bowel sounds. Extremities: no cyanosis, clubbing, rash, edema. RBKA . LLE 1+ edema Neuro: alert & orientedx3, cranial nerves grossly intact. moves all 4 extremities w/o difficulty. Affect pleasant  ASSESSMENT & PLAN:  1. Chronic systolic CHF: ICM.  - Echo 03/24/17 EF 45-50%. Set up repeat ECHO  - NYHA II.  - Volume status stable. Continue lasix as needed.   - Continue Coreg 12.5 mg BID -Continue hydralazine to 100 mg TID. - Continue Imdur 90 mg daily.  - No ARB with CKD Stage IV.   2.CAD - no s/s ischemia.  - LHC 2016 with prox to mid 90% LAD lesion, ostial LCx 95% stenosed. Prox RCA 60%. Not candidate for CABG or PCI. NSTEM 04/2018. Dr Martinique reviewed last [facility] and recommended medical management.   -  No s/s ischemia.  - Continue ASA and Brilinta.  - Continue lipitor.    3. PAD s/p R AKA - Stable. No change.   4. DMII  - Follows with PCP. No change.   5. TIA  - No recent symptoms.  - Continue statin and ASA.   6. CKD Stage V - Sees Dr. Joesph July in [facility].  Creatinine baseline around 4.  -She has plans to start dialysis soon.    7. HTN Stable.    Follow up 3 months with an ECHO.   Darrick Grinder, NP  9:18 AM

## 2018-05-11 NOTE — Patient Instructions (Signed)
Your physician recommends that you schedule a follow-up appointment in: 3 months with echocardiogram  

## 2018-05-15 DIAGNOSIS — N185 Chronic kidney disease, stage 5: Secondary | ICD-10-CM | POA: Diagnosis not present

## 2018-05-15 DIAGNOSIS — I5022 Chronic systolic (congestive) heart failure: Secondary | ICD-10-CM | POA: Diagnosis not present

## 2018-05-15 DIAGNOSIS — I25110 Atherosclerotic heart disease of native coronary artery with unstable angina pectoris: Secondary | ICD-10-CM | POA: Diagnosis not present

## 2018-05-15 DIAGNOSIS — I214 Non-ST elevation (NSTEMI) myocardial infarction: Secondary | ICD-10-CM | POA: Diagnosis not present

## 2018-05-15 DIAGNOSIS — E1122 Type 2 diabetes mellitus with diabetic chronic kidney disease: Secondary | ICD-10-CM | POA: Diagnosis not present

## 2018-05-15 DIAGNOSIS — I130 Hypertensive heart and chronic kidney disease with heart failure and stage 1 through stage 4 chronic kidney disease, or unspecified chronic kidney disease: Secondary | ICD-10-CM | POA: Diagnosis not present

## 2018-05-28 DIAGNOSIS — E1122 Type 2 diabetes mellitus with diabetic chronic kidney disease: Secondary | ICD-10-CM | POA: Diagnosis not present

## 2018-05-28 DIAGNOSIS — I130 Hypertensive heart and chronic kidney disease with heart failure and stage 1 through stage 4 chronic kidney disease, or unspecified chronic kidney disease: Secondary | ICD-10-CM | POA: Diagnosis not present

## 2018-05-28 DIAGNOSIS — I214 Non-ST elevation (NSTEMI) myocardial infarction: Secondary | ICD-10-CM | POA: Diagnosis not present

## 2018-05-28 DIAGNOSIS — I25110 Atherosclerotic heart disease of native coronary artery with unstable angina pectoris: Secondary | ICD-10-CM | POA: Diagnosis not present

## 2018-05-28 DIAGNOSIS — N185 Chronic kidney disease, stage 5: Secondary | ICD-10-CM | POA: Diagnosis not present

## 2018-05-28 DIAGNOSIS — I5022 Chronic systolic (congestive) heart failure: Secondary | ICD-10-CM | POA: Diagnosis not present

## 2018-05-29 ENCOUNTER — Other Ambulatory Visit: Payer: Self-pay | Admitting: Cardiology

## 2018-06-08 DIAGNOSIS — E785 Hyperlipidemia, unspecified: Secondary | ICD-10-CM | POA: Diagnosis not present

## 2018-06-08 DIAGNOSIS — E211 Secondary hyperparathyroidism, not elsewhere classified: Secondary | ICD-10-CM | POA: Diagnosis not present

## 2018-06-08 DIAGNOSIS — N184 Chronic kidney disease, stage 4 (severe): Secondary | ICD-10-CM | POA: Diagnosis not present

## 2018-06-08 DIAGNOSIS — I10 Essential (primary) hypertension: Secondary | ICD-10-CM | POA: Diagnosis not present

## 2018-06-08 DIAGNOSIS — N189 Chronic kidney disease, unspecified: Secondary | ICD-10-CM | POA: Diagnosis not present

## 2018-06-08 DIAGNOSIS — D649 Anemia, unspecified: Secondary | ICD-10-CM | POA: Diagnosis not present

## 2018-06-08 DIAGNOSIS — E119 Type 2 diabetes mellitus without complications: Secondary | ICD-10-CM | POA: Diagnosis not present

## 2018-06-08 DIAGNOSIS — E7800 Pure hypercholesterolemia, unspecified: Secondary | ICD-10-CM | POA: Diagnosis not present

## 2018-06-11 DIAGNOSIS — N185 Chronic kidney disease, stage 5: Secondary | ICD-10-CM | POA: Diagnosis not present

## 2018-06-11 DIAGNOSIS — I5022 Chronic systolic (congestive) heart failure: Secondary | ICD-10-CM | POA: Diagnosis not present

## 2018-06-11 DIAGNOSIS — I130 Hypertensive heart and chronic kidney disease with heart failure and stage 1 through stage 4 chronic kidney disease, or unspecified chronic kidney disease: Secondary | ICD-10-CM | POA: Diagnosis not present

## 2018-06-11 DIAGNOSIS — I25110 Atherosclerotic heart disease of native coronary artery with unstable angina pectoris: Secondary | ICD-10-CM | POA: Diagnosis not present

## 2018-06-11 DIAGNOSIS — E1122 Type 2 diabetes mellitus with diabetic chronic kidney disease: Secondary | ICD-10-CM | POA: Diagnosis not present

## 2018-06-11 DIAGNOSIS — I214 Non-ST elevation (NSTEMI) myocardial infarction: Secondary | ICD-10-CM | POA: Diagnosis not present

## 2018-06-19 DIAGNOSIS — N189 Chronic kidney disease, unspecified: Secondary | ICD-10-CM | POA: Diagnosis not present

## 2018-06-26 DIAGNOSIS — E1122 Type 2 diabetes mellitus with diabetic chronic kidney disease: Secondary | ICD-10-CM | POA: Diagnosis not present

## 2018-06-26 DIAGNOSIS — N185 Chronic kidney disease, stage 5: Secondary | ICD-10-CM | POA: Diagnosis not present

## 2018-06-26 DIAGNOSIS — I214 Non-ST elevation (NSTEMI) myocardial infarction: Secondary | ICD-10-CM | POA: Diagnosis not present

## 2018-06-26 DIAGNOSIS — I25110 Atherosclerotic heart disease of native coronary artery with unstable angina pectoris: Secondary | ICD-10-CM | POA: Diagnosis not present

## 2018-06-26 DIAGNOSIS — I5022 Chronic systolic (congestive) heart failure: Secondary | ICD-10-CM | POA: Diagnosis not present

## 2018-06-26 DIAGNOSIS — I130 Hypertensive heart and chronic kidney disease with heart failure and stage 1 through stage 4 chronic kidney disease, or unspecified chronic kidney disease: Secondary | ICD-10-CM | POA: Diagnosis not present

## 2018-07-02 ENCOUNTER — Encounter: Payer: Self-pay | Admitting: Family

## 2018-07-02 ENCOUNTER — Ambulatory Visit (INDEPENDENT_AMBULATORY_CARE_PROVIDER_SITE_OTHER): Payer: Medicare Other | Admitting: Family

## 2018-07-02 VITALS — BP 159/76 | HR 66 | Resp 16 | Ht 63.5 in | Wt 160.0 lb

## 2018-07-02 DIAGNOSIS — Z87891 Personal history of nicotine dependence: Secondary | ICD-10-CM | POA: Diagnosis not present

## 2018-07-02 DIAGNOSIS — Z89611 Acquired absence of right leg above knee: Secondary | ICD-10-CM

## 2018-07-02 DIAGNOSIS — I779 Disorder of arteries and arterioles, unspecified: Secondary | ICD-10-CM

## 2018-07-02 DIAGNOSIS — N185 Chronic kidney disease, stage 5: Secondary | ICD-10-CM | POA: Diagnosis not present

## 2018-07-02 NOTE — Progress Notes (Signed)
[facility] [place]   CC: Follow up peripheral artery occlusive disease  History of Present Illness Jenna Brown is a 67 y.o. female who underwent multiple attempts at revascularization by Dr. Trula Brown of her right leg for nonhealing wound. On 05/10/2012, she ultimately required a right above-knee amputation.  She was using a prosthetic leg, but is too heavy for her lately. She gets around mostly in her w/c. She denies any symptoms on her left leg, denies non healing wounds.  She had a stroke in the in the 1990's as manifested by left facial droop, difficulty speaking, no hemiparesis, no monocular loss of vision.   She reports pain in her right hip x 2 weeks, only when supine, also feels phantom pain in right heel (s/p AKA).  Her current prosthesis is heavy, states she does not use it since it is too heavy.   Pt has been lost to follow up from 2016 until today; states she felt she did not need to return, no testing was done since 2016. Pt lives with her sister and mother.   Her last serum creatinine result on file was 4.2, GFR 12, on 04-23-18, stage 5 CKD. Pt sees Dr. Joesph Brown, nephrologist in [facility]. Sister with pt states that Dr. Joesph Brown has discussed hemodialysis; pt has no HD access yet.    Diabetic: Yes, last A1C result on file was 6.8 on 01-11-18, improved form 8.2 Tobacco use: former smoker, quit in 2013, started in her 20's  Pt meds include: Statin :Yes Betablocker: Yes ASA: Yes Other anticoagulants/antiplatelets: Brilinta, states Xaralto caused her to feel like she was dying, did not eat when she was on [place].   Past Medical History:  Diagnosis Date  . Anemia   . Anxiety    takes Xanax prn  . Arthritis   . CKD (chronic kidney disease), stage IV ([place])   . Coronary artery disease 2016   cath w/ 90% LAD, 95%CFX, 80% OM 2, 60% RCA, not CABG candidate, rx medically  . GERD (gastroesophageal reflux disease)   . H/O hiatal hernia   . Headache(784.0)     when b/p is elevated  . Hemorrhoids   . Hx of AKA (above knee amputation), right ([place])   . Hyperlipidemia    takes Simvastatin daily  . Hypertension    takes Amlodipine/HCTZ/Losartan daily  . Migraines   . Myocardial infarction ([place]) 1990's  . NSTEMI (non-ST elevated myocardial infarction) ([place])   . Peripheral vascular disease ([place])   . PONV (postoperative nausea and vomiting)   . Stroke [facility])    TIA history  . Type II diabetes mellitus (HCC)    on Lantus  . Vertigo    takes Ativert prn    Social History Social History   Tobacco Use  . Smoking status: Former Smoker    Packs/day: 0.25    Years: 40.00    Pack years: 10.00    Types: Cigarettes    Last attempt to quit: 02/27/2012    Years since quitting: 6.3  . Smokeless tobacco: Never Used  Substance Use Topics  . Alcohol use: No  . Drug use: No    Family History Family History  Problem Relation Age of Onset  . Cancer Mother        breast  . Diabetes Father     Past Surgical History:  Procedure Laterality Date  . ABDOMINAL AORTAGRAM N/A 02/29/2012   Procedure: ABDOMINAL Jenna Brown;  Surgeon: Jenna Mitchell, MD;  Location: [facility]  CATH LAB;  Service: Cardiovascular;  Laterality: N/A;  . AMPUTATION  05/10/2012   Procedure: AMPUTATION ABOVE KNEE;  Surgeon: Jenna Mitchell, MD;  Location: [place] OR;  Service: Vascular;  Laterality: Right;   open right groin wound noted  . aortogram  02/2012  . CARDIAC CATHETERIZATION N/A 04/13/2015   Procedure: Right/Left Heart Cath and Coronary Angiography;  Surgeon: Jenna Harp, MD;  Location: [place] CV LAB;  Service: Cardiovascular;  Laterality: N/A;  . CLEFT PALATE REPAIR     several  . COLONOSCOPY    . DILATION AND CURETTAGE OF UTERUS    . FEMORAL-POPLITEAL BYPASS GRAFT  04/05/2012   Procedure: BYPASS GRAFT FEMORAL-POPLITEAL ARTERY;  Surgeon: Jenna Mitchell, MD;  Location: [place];  Service: Vascular;  Laterality: Right;  REVISION  . FEMORAL-TIBIAL BYPASS GRAFT  03/29/2012    Procedure: BYPASS GRAFT FEMORAL-TIBIAL ARTERY;  Surgeon: Jenna Mitchell, MD;  Location: [place] OR;  Service: Vascular;  Laterality: Right;  Right femoral to Posterior Tibialis with composite graft of 79mm x 80 cm ringed gortex graft and saphenous vein  ,intraoperative arteriogram  . FEMOROPOPLITEAL THROMBECTOMY / EMBOLECTOMY  05/09/2012  . MYOMECTOMY    . PR VEIN BYPASS GRAFT,AORTO-FEM-POP  05/27/11   Right SFA-Below knee Pop BP  . TEE WITHOUT CARDIOVERSION N/A 01/03/2017   Procedure: TRANSESOPHAGEAL ECHOCARDIOGRAM (TEE);  Surgeon: Jenna Latch, MD;  Location: [facility] ENDOSCOPY;  Service: Cardiovascular;  Laterality: N/A;  . TONSILLECTOMY    . TUBAL LIGATION  ~ 1990    Allergies  Allergen Reactions  . Plavix [Clopidogrel Bisulfate] Itching  . Codeine Other (See Comments)    "makes me feel strange"  . Tylenol With Codeine #3 [Acetaminophen-Codeine] Other (See Comments)    "doesn't make me feel right"    Current Outpatient Medications  Medication Sig Dispense Refill  . acetaminophen (TYLENOL) 650 MG CR tablet Take 650 mg by mouth every 8 (eight) hours as needed for pain.     Marland Kitchen ALPRAZolam (XANAX) 0.25 MG tablet Take 1 tablet (0.25 mg total) by mouth 3 (three) times daily as needed for anxiety. 12 tablet 0  . amLODipine (NORVASC) 5 MG tablet Take 1 tablet (5 mg total) by mouth daily. 30 tablet 1  . aspirin 81 MG chewable tablet Chew 81 mg by mouth daily.    Marland Kitchen atorvastatin (LIPITOR) 80 MG tablet Take 80 mg by mouth at bedtime.    . carvedilol (COREG) 12.5 MG tablet Take 1.5 tablets (18.75 mg total) by mouth 2 (two) times daily with a meal. 120 tablet 1  . cholecalciferol (VITAMIN D) 1000 units tablet Take 1,000 Units by mouth daily.    Marland Kitchen ezetimibe (ZETIA) 10 MG tablet Take 1 tablet (10 mg total) by mouth daily. 30 tablet 1  . ferrous sulfate 325 (65 FE) MG tablet Take 325 mg by mouth once a week.     . furosemide (LASIX) 40 MG tablet Take 1 tablet (40 mg total) by mouth daily as needed. (Patient  taking differently: Take 40 mg by mouth daily as needed for fluid or edema. ) 30 tablet 11  . hydrALAZINE (APRESOLINE) 100 MG tablet Take 1 tablet (100 mg total) by mouth 3 (three) times daily. 90 tablet 3  . insulin glargine (LANTUS) 100 UNIT/ML injection Inject 0.25 mLs (25 Units total) into the skin 2 (two) times daily. (Patient taking differently: Inject 25 Units into the skin at bedtime. ) 10 mL 11  . insulin lispro (HUMALOG) 100 UNIT/ML injection Inject 4 Units  into the skin See admin instructions. Below 110 do not take it. 110-115 and above take 4 units    . isosorbide mononitrate (IMDUR) 120 MG 24 hr tablet Take 1 tablet (120 mg total) by mouth daily. 30 tablet 1  . nitroGLYCERIN (NITROSTAT) 0.4 MG SL tablet Place 1 tablet (0.4 mg total) under the tongue every 5 (five) minutes x 3 doses as needed for chest pain. 25 tablet 3  . ticagrelor (BRILINTA) 90 MG TABS tablet Take 1 tablet (90 mg total) by mouth 2 (two) times daily. 60 tablet 11   No current facility-administered medications for this visit.     ROS: See HPI for pertinent positives and negatives.   Physical Examination  Vitals:   07/02/18 1024  BP: (!) 159/76  Pulse: 66  Resp: 16  SpO2: 98%  Weight: 160 lb (72.6 kg)  Height: 5' 3.5" (1.613 m)   Body mass index is 27.9 kg/m.  General: A&O x 3, WDWN, female accompanied by her sister.  Gait: seated in her w/c HENT: No gross abnormalities.  Eyes: PERRLA. Pulmonary: Respirations are non labored, CTAB, good air movement in all fields Cardiac: regular rhythm, no detected murmur.         Carotid Bruits Right Left   Negative Negative   Radial pulses are 2+ palpable bilaterally   Adominal aortic pulse is not palpable                         VASCULAR EXAM: Extremities without ischemic changes, without Gangrene; without open wounds. 1-2+ pitting and non pitting edema in left ankle and foot. Right AKA stump with no lesions.                                                                                                            LE Pulses Right Left       FEMORAL  3+ palpable  not palpable        POPLITEAL  AKA   not palpable       POSTERIOR TIBIAL  AKA    not palpable        DORSALIS PEDIS      ANTERIOR TIBIAL AKA   not palpable    Abdomen: soft, NT, no palpable masses. Skin: no rashes, no cellulitis, no ulcers noted. Musculoskeletal: no muscle wasting or atrophy.  Neurologic: A&O X 3; appropriate affect, Sensation is normal; MOTOR FUNCTION:  moving all extremities equally, motor strength 5/5 throughout. Speech is fluent/normal. CN 2-12 intact. Psychiatric: Thought content is normal, mood appropriate for clinical situation.    Non-Invasive Vascular Imaging: DATE: 01/02/2015 ABI: RIGHT (AKA);  LEFT 0.96 (03/20/12, 0.97), Waveforms: Biphasic; TBI: 0.72 No lower extremity arterial studies performed since 2016.    ASSESSMENT: CHERY GIUSTO is a 67 y.o. female who is s/p multiple attempts at revascularization of her right leg for nonhealing wound. On 05/10/2012, she ultimately required a right above-knee amputation.    She walks with her walker at times, but mostly is w/c bound, does  not use her right AKA prosthesis as she states it is too heavy. She has no claudication in her left leg, but she does not seem to walk enough to elicit claudication symptoms. There are no signs of ischemia in her left foot or leg.   Her atherosclerotic risk factors include Type 2 DM, stage 5 CKD not yet on hemodialysis (does not yet have an HD access), and former smoker.  Will defer to pt PCP or nephrologist whether or not to prescribe something for right leg phantom pain, and evaluation of right hip pain only when supine. Her right femoral pulse is 3+ palpable, left is not palpable with pt seated in her w/c.    PLAN:  Based on the patient's HPI and examination, pt will return to clinic this or next week for left ABI, see me afterward.   Daily seated leg exercises  demonstrated and discussed with pt and her sister.   Will refer back to [employer] for a new prosthesis that is lighter and usable for her.   I discussed in depth with the patient the nature of atherosclerosis, and emphasized the importance of maximal medical management including strict control of blood pressure, blood glucose, and lipid levels, obtaining regular exercise, and continued cessation of smoking.  The patient is aware that without maximal medical management the underlying atherosclerotic disease process will progress, limiting the benefit of any interventions.  The patient was given information about PAD including signs, symptoms, treatment, what symptoms should prompt the patient to seek immediate medical care, and risk reduction measures to take.  Jenna Chambers, RN, MSN, FNP-C [facility] [employer] Phone: [phone]  Clinic MD: [facility]  07/02/18 10:59 AM

## 2018-07-02 NOTE — Patient Instructions (Signed)

## 2018-07-09 ENCOUNTER — Other Ambulatory Visit: Payer: Self-pay

## 2018-07-09 DIAGNOSIS — Z2821 Immunization not carried out because of patient refusal: Secondary | ICD-10-CM | POA: Diagnosis not present

## 2018-07-09 DIAGNOSIS — E538 Deficiency of other specified B group vitamins: Secondary | ICD-10-CM | POA: Diagnosis not present

## 2018-07-09 DIAGNOSIS — Z89611 Acquired absence of right leg above knee: Secondary | ICD-10-CM | POA: Diagnosis not present

## 2018-07-09 DIAGNOSIS — N183 Chronic kidney disease, stage 3 (moderate): Secondary | ICD-10-CM | POA: Diagnosis not present

## 2018-07-09 DIAGNOSIS — D638 Anemia in other chronic diseases classified elsewhere: Secondary | ICD-10-CM | POA: Diagnosis not present

## 2018-07-09 DIAGNOSIS — E559 Vitamin D deficiency, unspecified: Secondary | ICD-10-CM | POA: Diagnosis not present

## 2018-07-09 DIAGNOSIS — I10 Essential (primary) hypertension: Secondary | ICD-10-CM | POA: Diagnosis not present

## 2018-07-09 DIAGNOSIS — I779 Disorder of arteries and arterioles, unspecified: Secondary | ICD-10-CM

## 2018-07-09 DIAGNOSIS — R1013 Epigastric pain: Secondary | ICD-10-CM | POA: Diagnosis not present

## 2018-07-09 DIAGNOSIS — E785 Hyperlipidemia, unspecified: Secondary | ICD-10-CM | POA: Diagnosis not present

## 2018-07-09 DIAGNOSIS — E119 Type 2 diabetes mellitus without complications: Secondary | ICD-10-CM | POA: Diagnosis not present

## 2018-07-09 DIAGNOSIS — I119 Hypertensive heart disease without heart failure: Secondary | ICD-10-CM | POA: Diagnosis not present

## 2018-07-10 ENCOUNTER — Ambulatory Visit (HOSPITAL_COMMUNITY)
Admission: RE | Admit: 2018-07-10 | Discharge: 2018-07-10 | Disposition: A | Discharge status: Home / Self Care | Payer: Medicare Other | Source: Ambulatory Visit | Attending: Family | Admitting: Family

## 2018-07-10 ENCOUNTER — Ambulatory Visit (HOSPITAL_COMMUNITY): Payer: Medicare Other

## 2018-07-10 DIAGNOSIS — Z89611 Acquired absence of right leg above knee: Secondary | ICD-10-CM

## 2018-07-10 DIAGNOSIS — I779 Disorder of arteries and arterioles, unspecified: Secondary | ICD-10-CM | POA: Diagnosis not present

## 2018-07-12 ENCOUNTER — Emergency Department (HOSPITAL_COMMUNITY)
Admission: EM | Admit: 2018-07-12 | Discharge: 2018-07-12 | Disposition: A | Discharge status: Home / Self Care | Payer: Medicare Other | Attending: Emergency Medicine | Admitting: Emergency Medicine

## 2018-07-12 ENCOUNTER — Encounter (HOSPITAL_COMMUNITY): Payer: Self-pay | Admitting: Emergency Medicine

## 2018-07-12 ENCOUNTER — Other Ambulatory Visit: Payer: Self-pay

## 2018-07-12 ENCOUNTER — Emergency Department (HOSPITAL_COMMUNITY): Payer: Medicare Other

## 2018-07-12 ENCOUNTER — Encounter: Payer: Self-pay | Admitting: Family

## 2018-07-12 ENCOUNTER — Ambulatory Visit (INDEPENDENT_AMBULATORY_CARE_PROVIDER_SITE_OTHER): Payer: Medicare Other | Admitting: Family

## 2018-07-12 VITALS — BP 122/62 | Temp 98.0°F | Resp 16 | Ht 63.5 in | Wt 159.0 lb

## 2018-07-12 DIAGNOSIS — Z8673 Personal history of transient ischemic attack (TIA), and cerebral infarction without residual deficits: Secondary | ICD-10-CM | POA: Insufficient documentation

## 2018-07-12 DIAGNOSIS — R52 Pain, unspecified: Secondary | ICD-10-CM

## 2018-07-12 DIAGNOSIS — I779 Disorder of arteries and arterioles, unspecified: Secondary | ICD-10-CM

## 2018-07-12 DIAGNOSIS — Z79899 Other long term (current) drug therapy: Secondary | ICD-10-CM | POA: Insufficient documentation

## 2018-07-12 DIAGNOSIS — I2510 Atherosclerotic heart disease of native coronary artery without angina pectoris: Secondary | ICD-10-CM | POA: Diagnosis not present

## 2018-07-12 DIAGNOSIS — Z794 Long term (current) use of insulin: Secondary | ICD-10-CM | POA: Insufficient documentation

## 2018-07-12 DIAGNOSIS — I252 Old myocardial infarction: Secondary | ICD-10-CM | POA: Insufficient documentation

## 2018-07-12 DIAGNOSIS — M25551 Pain in right hip: Secondary | ICD-10-CM | POA: Diagnosis not present

## 2018-07-12 DIAGNOSIS — Z7982 Long term (current) use of aspirin: Secondary | ICD-10-CM | POA: Diagnosis not present

## 2018-07-12 DIAGNOSIS — Z87891 Personal history of nicotine dependence: Secondary | ICD-10-CM | POA: Insufficient documentation

## 2018-07-12 DIAGNOSIS — N185 Chronic kidney disease, stage 5: Secondary | ICD-10-CM

## 2018-07-12 DIAGNOSIS — Z89611 Acquired absence of right leg above knee: Secondary | ICD-10-CM

## 2018-07-12 DIAGNOSIS — I129 Hypertensive chronic kidney disease with stage 1 through stage 4 chronic kidney disease, or unspecified chronic kidney disease: Secondary | ICD-10-CM | POA: Insufficient documentation

## 2018-07-12 DIAGNOSIS — N184 Chronic kidney disease, stage 4 (severe): Secondary | ICD-10-CM | POA: Diagnosis not present

## 2018-07-12 DIAGNOSIS — E1122 Type 2 diabetes mellitus with diabetic chronic kidney disease: Secondary | ICD-10-CM | POA: Diagnosis not present

## 2018-07-12 DIAGNOSIS — E785 Hyperlipidemia, unspecified: Secondary | ICD-10-CM | POA: Diagnosis not present

## 2018-07-12 DIAGNOSIS — I255 Ischemic cardiomyopathy: Secondary | ICD-10-CM | POA: Diagnosis not present

## 2018-07-12 MED ORDER — OXYCODONE-ACETAMINOPHEN 5-325 MG PO TABS
1.0000 | ORAL_TABLET | Freq: Once | ORAL | Status: AC
  Administered 2018-07-12: 1 via ORAL
  Filled 2018-07-12: qty 1

## 2018-07-12 MED ORDER — OXYCODONE-ACETAMINOPHEN 5-325 MG PO TABS: 1.0000 | ORAL_TABLET | ORAL | 0 refills | Status: AC | PRN

## 2018-07-12 NOTE — ED Notes (Signed)
Patient transported to X-ray 

## 2018-07-12 NOTE — ED Provider Notes (Signed)
[place] EMERGENCY DEPARTMENT Provider Note   CSN: [id number] Arrival date & time: 07/12/18  1533     History   Chief Complaint Chief Complaint  Patient presents with  . Hip Pain  . Leg Pain    HPI Jenna Brown is a 67 y.o. female.  67 y/o female with an extensive PMH including CKD, AKA, HTN,NSTEMI,MI presents to the ED with a chief complaint of right hip pain x 3 weeks. Patient describes the pain as twisting/throbbing around her right hip with radiation to her thigh. She reports the pain is worse when lying flat in bed and with sitting up. Patient denies any alleviating factors.  Reports taking some Tylenol for her pain but having no relieving symptoms.  [facility] and told her right hip pain is not due to her AKA as they told her this is less likely a circulation problem.  Is also seen by her PCP on Tuesday who had no explanation for her right hip pain and was told x-ray however due to transportation difficulties they were unable to obtain x-ray.  She denies any trauma, fever, chest pain or shortness of breath. Sister at the bedside patient does not ambulate with a prosthesis as this is too heavy and usually just holds onto others around her in order to get around but mainly is transported via wheelchair.  The history is provided by the patient and a relative.    Past Medical History:  Diagnosis Date  . Anemia   . Anxiety    takes Xanax prn  . Arthritis   . CKD (chronic kidney disease), stage IV ([place])   . Coronary artery disease 2016   cath w/ 90% LAD, 95%CFX, 80% OM 2, 60% RCA, not CABG candidate, rx medically  . GERD (gastroesophageal reflux disease)   . H/O hiatal hernia   . Headache(784.0)    when b/p is elevated  . Hemorrhoids   . Hx of AKA (above knee amputation), right ([place])   . Hyperlipidemia    takes Simvastatin daily  . Hypertension    takes Amlodipine/HCTZ/Losartan daily  . Migraines     . Myocardial infarction ([place]) 1990's  . NSTEMI (non-ST elevated myocardial infarction) ([place])   . Peripheral vascular disease ([place])   . PONV (postoperative nausea and vomiting)   . Stroke [facility])    TIA history  . Type II diabetes mellitus (HCC)    on Lantus  . Vertigo    takes Ativert prn    Patient Active Problem List   Diagnosis Date Noted  . Chest pain with high risk for cardiac etiology   . Coronary artery disease   . Unstable angina pectoris due to coronary arteriosclerosis ([place]) 04/19/2018  . Goals of care, counseling/discussion   . Palliative care by specialist   . CKD (chronic kidney disease) stage 5, GFR less than 15 ml/min (HCC)   . Insulin-requiring or dependent type II diabetes mellitus ([place]) 01/11/2018  . Cardiac mass   . Chronic kidney disease (CKD), stage IV (severe) ([place]) 12/26/2016  . Hypertensive emergency   . CAD in native artery   . Cardiomyopathy, ischemic 05/14/2015  . TIA (transient ischemic attack) 05/14/2015  . PAD (peripheral artery disease) ([place]) 05/14/2015  . Chronic systolic heart failure ([place]) 05/14/2015  . Coronary artery disease due to lipid rich plaque   . Cardiac enzymes elevated   . Non-ST elevation (NSTEMI) myocardial infarction ([place])   .  Essential hypertension   . Hyperlipidemia LDL goal <70   . Anemia 05/10/2012  . 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 Stroke ([place])   . Peripheral vascular disease [facility])     Past Surgical History:  Procedure Laterality Date  . ABDOMINAL AORTAGRAM N/A 02/29/2012   Procedure: ABDOMINAL Maxcine Ham;  Surgeon: [name], MD;  Location: [facility] CATH LAB;  Service: Cardiovascular;  Laterality: N/A;  . AMPUTATION  05/10/2012   Procedure: AMPUTATION ABOVE KNEE;  Surgeon: [name], MD;  Location: [place] OR;  Service: Vascular;  Laterality: Right;   open right groin wound noted  . aortogram  02/2012  . CARDIAC CATHETERIZATION N/A 04/13/2015   Procedure: Right/Left Heart Cath  and Coronary Angiography;  Surgeon: [name], MD;  Location: [place] CV LAB;  Service: Cardiovascular;  Laterality: N/A;  . CLEFT PALATE REPAIR     several  . COLONOSCOPY    . DILATION AND CURETTAGE OF UTERUS    . FEMORAL-POPLITEAL BYPASS GRAFT  04/05/2012   Procedure: BYPASS GRAFT FEMORAL-POPLITEAL ARTERY;  Surgeon: [name], MD;  Location: [place];  Service: Vascular;  Laterality: Right;  REVISION  . FEMORAL-TIBIAL BYPASS GRAFT  03/29/2012   Procedure: BYPASS GRAFT FEMORAL-TIBIAL ARTERY;  Surgeon: [name], MD;  Location: [place] OR;  Service: Vascular;  Laterality: Right;  Right femoral to Posterior Tibialis with composite graft of 44mm x 80 cm ringed gortex graft and saphenous vein  ,intraoperative arteriogram  . FEMOROPOPLITEAL THROMBECTOMY / EMBOLECTOMY  05/09/2012  . MYOMECTOMY    . PR VEIN BYPASS GRAFT,AORTO-FEM-POP  05/27/11   Right SFA-Below knee Pop BP  . TEE WITHOUT CARDIOVERSION N/A 01/03/2017   Procedure: TRANSESOPHAGEAL ECHOCARDIOGRAM (TEE);  Surgeon: Skeet Latch, MD;  Location[facility] ENDOSCOPY;  Service: Cardiovascular;  Laterality: N/A;  . TONSILLECTOMY    . TUBAL LIGATION  ~ 1990     OB History   None      Home Medications    Prior to Admission medications   Medication Sig Start Date End Date Taking? Authorizing Provider  acetaminophen (TYLENOL) 650 MG CR tablet Take 650 mg by mouth every 8 (eight) hours as needed for pain.     [provider]  ALPRAZolam Duanne Moron) 0.25 MG tablet Take 1 tablet (0.25 mg total) by mouth 3 (three) times daily as needed for anxiety. 01/03/17   Delos Haring, PA-C  amLODipine (NORVASC) 5 MG tablet Take 1 tablet (5 mg total) by mouth daily. 04/24/18   Cheryln Manly, NP  aspirin 81 MG chewable tablet Chew 81 mg by mouth daily.    [provider]  atorvastatin (LIPITOR) 80 MG tablet Take 80 mg by mouth at bedtime. 01/09/18   [provider]  carvedilol (COREG) 12.5 MG tablet Take 1.5 tablets (18.75  mg total) by mouth 2 (two) times daily with a meal. 04/23/18   Reino Bellis B, NP  cholecalciferol (VITAMIN D) 1000 units tablet Take 1,000 Units by mouth daily.    [provider]  ezetimibe (ZETIA) 10 MG tablet Take 1 tablet (10 mg total) by mouth daily. 04/24/18   Reino Bellis B, NP  ferrous sulfate 325 (65 FE) MG tablet Take 325 mg by mouth once a week.     [provider]  furosemide (LASIX) 40 MG tablet Take 1 tablet (40 mg total) by mouth daily as needed. Patient taking differently: Take 40 mg by mouth daily as needed for fluid or edema.  01/29/18   [name], PA-C  hydrALAZINE (APRESOLINE) 100 MG tablet Take 1 tablet (100 mg total) by mouth 3 (three) times daily. 05/02/18   [name], PA-C  HYDROcodone-acetaminophen (NORCO/VICODIN) 5-325 MG tablet  07/09/18   [provider]  insulin glargine (LANTUS) 100 UNIT/ML injection Inject 0.25 mLs (25 Units total) into the skin 2 (two) times daily. Patient taking differently: Inject 25 Units into the skin at bedtime.  01/03/17   Delos Haring, PA-C  insulin lispro (HUMALOG) 100 UNIT/ML injection Inject 4 Units into the skin See admin instructions. Below 110 do not take it. 110-115 and above take 4 units    [provider]  isosorbide mononitrate (IMDUR) 120 MG 24 hr tablet Take 1 tablet (120 mg total) by mouth daily. 04/24/18   Cheryln Manly, NP  nitroGLYCERIN (NITROSTAT) 0.4 MG SL tablet Place 1 tablet (0.4 mg total) under the tongue every 5 (five) minutes x 3 doses as needed for chest pain. 08/13/15   Theora Gianotti, NP  oxyCODONE-acetaminophen (PERCOCET/ROXICET) 5-325 MG tablet Take 1 tablet by mouth every 4 (four) hours as needed for up to 5 days for severe pain. 07/12/18 07/17/18  Janeece Fitting, PA-C  ticagrelor (BRILINTA) 90 MG TABS tablet Take 1 tablet (90 mg total) by mouth 2 (two) times daily. 01/03/17   Delos Haring, PA-C    Family History Family History   Problem Relation Age of Onset  . Cancer Mother        breast  . Diabetes Father     Social History Social History   Tobacco Use  . Smoking status: Former Smoker    Packs/day: 0.25    Years: 40.00    Pack years: 10.00    Types: Cigarettes    Last attempt to quit: 02/27/2012    Years since quitting: 6.3  . Smokeless tobacco: Never Used  Substance Use Topics  . Alcohol use: No  . Drug use: No     Allergies   Plavix [clopidogrel bisulfate]; Codeine; and Tylenol with codeine #3 [acetaminophen-codeine]   Review of Systems Review of Systems  Constitutional: Negative for chills and fever.  Respiratory: Negative for shortness of breath.   Cardiovascular: Negative for chest pain.  Musculoskeletal: Positive for arthralgias.     Physical Exam Updated Vital Signs BP (!) 156/70 (BP Location: Right Arm)   Pulse 69   Temp 98 F (36.7 C) (Oral)   Resp 16   SpO2 99%   Physical Exam  Constitutional: She is oriented to person, place, and time. She appears well-developed and well-nourished.  HENT:  Head: Normocephalic and atraumatic.  Neck: Normal range of motion. Neck supple.  Cardiovascular: Normal heart sounds.  Pulmonary/Chest: Breath sounds normal. She has no wheezes.  Abdominal: Soft.  Musculoskeletal: She exhibits tenderness.       Right hip: She exhibits tenderness and bony tenderness. She exhibits no swelling, no deformity and no laceration.       Legs: Neurological: She is alert and oriented to person, place, and time.  Skin: Skin is warm and dry.  Nursing note and vitals reviewed.    ED Treatments / Results  Labs (all labs ordered are listed, but only abnormal results are displayed) Labs Reviewed - No data to display  EKG None  Radiology Dg Hip Unilat With Pelvis 2-3 Views Right  Result Date: 07/12/2018 CLINICAL DATA:  Pain for 2-3 weeks. History of an above the knee amputation. EXAM: DG HIP (WITH OR WITHOUT PELVIS) 2-3V  RIGHT COMPARISON:  None.  FINDINGS: Limited study due to skin folds over the proximal femur on all views. Within this limitation, no fracture or bony lesion noted. IMPRESSION: Limited study due to skin folds. No convincing evidence of fracture. Electronically Signed   By: Dorise Bullion III M.D   On: 07/12/2018 18:33    Procedures Procedures (including critical care time)  Medications Ordered in ED Medications  oxyCODONE-acetaminophen (PERCOCET/ROXICET) 5-325 MG per tablet 1 tablet (1 tablet Oral Given 07/12/18 1757)     Initial Impression / Assessment and Plan / ED Course  I have reviewed the triage vital signs and the nursing notes.  Pertinent labs & imaging results that were available during my care of the patient were reviewed by me and considered in my medical decision making (see chart for details).   Presents into the ED with right hip pain which began 3 weeks ago.  Patient has tried taking Tylenol for pain but states no relieving symptoms.  Patient was seen by PCP who suggested patient needs to get an x-ray, unable to provide this in office sent her to the ER in order to obtain this test.  I will order this test for patient.  Patient is also been given Percocet for her pain.  I have discussed this patient with Dr. [name] who has also seen this patient.  Plan is for x-ray to rule out any hip dislocation, fracture.  Pain control and have patient follow-up with orthopedist as needed. DG right hip showed no acute fracture or dislocation noted due to the skin folds exam was limited.   Final Clinical Impressions(s) / ED Diagnoses   Final diagnoses:  Pain  Right hip pain    ED Discharge Orders         Ordered    oxyCODONE-acetaminophen (PERCOCET/ROXICET) 5-325 MG tablet  Every 4 hours PRN     07/12/18 1902           Janeece Fitting, PA-C 07/12/18 1903    Isla Pence, MD 07/12/18 [phone]

## 2018-07-12 NOTE — ED Provider Notes (Signed)
Patient placed in Quick Look pathway, seen and evaluated   Chief Complaint: hip pain  HPI: Jenna Brown is a 67 y.o. female who presents to the ED with right hip pain that started 3 days ago. Patient with AKA right. Patient denies trauma to the area. Patient denies any other problems today.  ROS: M/S: right hip pain  Physical Exam:  BP (!) 156/70 (BP Location: Right Arm)   Pulse 69   Temp 98 F (36.7 C) (Oral)   Resp 16   SpO2 99%    Gen: No distress  Neuro: Awake and Alert  Skin: Warm and dry  M/S: tender with palpation right hip that radiates to the stump.   Initiation of care has begun. The patient has been counseled on the process, plan, and necessity for staying for the completion/evaluation, and the remainder of the medical screening examination    Ashley Murrain, NP 07/12/18 1618    Little, Wenda Over[place], MD 07/12/18 1659

## 2018-07-12 NOTE — Discharge Instructions (Signed)
Provided a referral to an orthopedist please schedule an appointment as needed.  I have also provided medication for your pain please take as needed alternating with Tylenol.  You may also apply heating pad to this area.  Please follow-up with your primary care in 1 week.

## 2018-07-12 NOTE — Progress Notes (Signed)
[facility] [place]   CC: Follow up peripheral artery occlusive disease  History of Present Illness Jenna Brown is a 67 y.o. female who underwent multiple attempts at revascularization by Dr. [name] of her right leg for nonhealing wound. On 05/10/2012, she ultimately required a right above-knee amputation. She was using a prosthetic leg, but is too heavy for her lately. She gets around mostly in her w/c. She denies any symptoms on her left leg, denies non healing wounds.  She had a stroke in the in the 1990's as manifested by left facial droop, difficulty speaking, no hemiparesis, no monocular loss of vision.   She reports pain in her right hip x 3 weeks, seems to radiate distally to her right AKA stump, and also RLQ abdominal pain; all of the pain seems to be worsening.   Her current prosthesis is heavy, states she does not use it since it is too heavy.   Pt had been lost to follow up from 2016 until 07-02-18; states she felt she did not need to return, no testing was done since 2016 until 07-10-18. Pt lives with her sister and mother.   Her last serum creatinine result on file was 4.2, GFR 12, on 04-23-18, stage 5 CKD. Pt sees Dr. Joesph July, nephrologist in [facility]. Sister with pt states that Dr. Joesph July has discussed hemodialysis; pt has no HD access yet.   She was hospitalized in August 2019, had an MI.    Diabetic: Yes, last A1C result on file was 6.8 on 01-11-18, improved form 8.2 Tobacco use: former smoker, quit in 2013, started in her 20's  Pt meds include: Statin :Yes Betablocker: Yes ASA: Yes Other anticoagulants/antiplatelets: Brilinta, states Xaralto caused her to feel like she was dying, did not eat when she was on [place].     Past Medical History:  Diagnosis Date  . Anemia   . Anxiety    takes Xanax prn  . Arthritis   . CKD (chronic kidney disease), stage IV ([place])   . Coronary artery disease 2016   cath w/ 90% LAD, 95%CFX, 80% OM 2,  60% RCA, not CABG candidate, rx medically  . GERD (gastroesophageal reflux disease)   . H/O hiatal hernia   . Headache(784.0)    when b/p is elevated  . Hemorrhoids   . Hx of AKA (above knee amputation), right ([place])   . Hyperlipidemia    takes Simvastatin daily  . Hypertension    takes Amlodipine/HCTZ/Losartan daily  . Migraines   . Myocardial infarction ([place]) 1990's  . NSTEMI (non-ST elevated myocardial infarction) ([place])   . Peripheral vascular disease ([place])   . PONV (postoperative nausea and vomiting)   . Stroke [facility])    TIA history  . Type II diabetes mellitus (HCC)    on Lantus  . Vertigo    takes Ativert prn    Social History Social History   Tobacco Use  . Smoking status: Former Smoker    Packs/day: 0.25    Years: 40.00    Pack years: 10.00    Types: Cigarettes    Last attempt to quit: 02/27/2012    Years since quitting: 6.3  . Smokeless tobacco: Never Used  Substance Use Topics  . Alcohol use: No  . Drug use: No    Family History Family History  Problem Relation Age of Onset  . Cancer Mother        breast  . Diabetes Father     Past Surgical  History:  Procedure Laterality Date  . ABDOMINAL AORTAGRAM N/A 02/29/2012   Procedure: ABDOMINAL Maxcine Ham;  Surgeon: [name], MD;  Location: [facility] CATH LAB;  Service: Cardiovascular;  Laterality: N/A;  . AMPUTATION  05/10/2012   Procedure: AMPUTATION ABOVE KNEE;  Surgeon: [name], MD;  Location: [place] OR;  Service: Vascular;  Laterality: Right;   open right groin wound noted  . aortogram  02/2012  . CARDIAC CATHETERIZATION N/A 04/13/2015   Procedure: Right/Left Heart Cath and Coronary Angiography;  Surgeon: [name], MD;  Location: [place] CV LAB;  Service: Cardiovascular;  Laterality: N/A;  . CLEFT PALATE REPAIR     several  . COLONOSCOPY    . DILATION AND CURETTAGE OF UTERUS    . FEMORAL-POPLITEAL BYPASS GRAFT  04/05/2012   Procedure: BYPASS GRAFT FEMORAL-POPLITEAL ARTERY;  Surgeon: [name], MD;  Location: [place];  Service: Vascular;  Laterality: Right;  REVISION  . FEMORAL-TIBIAL BYPASS GRAFT  03/29/2012   Procedure: BYPASS GRAFT FEMORAL-TIBIAL ARTERY;  Surgeon: [name], MD;  Location: [place] OR;  Service: Vascular;  Laterality: Right;  Right femoral to Posterior Tibialis with composite graft of 35mm x 80 cm ringed gortex graft and saphenous vein  ,intraoperative arteriogram  . FEMOROPOPLITEAL THROMBECTOMY / EMBOLECTOMY  05/09/2012  . MYOMECTOMY    . PR VEIN BYPASS GRAFT,AORTO-FEM-POP  05/27/11   Right SFA-Below knee Pop BP  . TEE WITHOUT CARDIOVERSION N/A 01/03/2017   Procedure: TRANSESOPHAGEAL ECHOCARDIOGRAM (TEE);  Surgeon: Skeet Latch, MD;  Location: [facility] ENDOSCOPY;  Service: Cardiovascular;  Laterality: N/A;  . TONSILLECTOMY    . TUBAL LIGATION  ~ 1990    Allergies  Allergen Reactions  . Plavix [Clopidogrel Bisulfate] Itching  . Codeine Other (See Comments)    "makes me feel strange"  . Tylenol With Codeine #3 [Acetaminophen-Codeine] Other (See Comments)    "doesn't make me feel right"    Current Outpatient Medications  Medication Sig Dispense Refill  . acetaminophen (TYLENOL) 650 MG CR tablet Take 650 mg by mouth every 8 (eight) hours as needed for pain.     Marland Kitchen ALPRAZolam (XANAX) 0.25 MG tablet Take 1 tablet (0.25 mg total) by mouth 3 (three) times daily as needed for anxiety. 12 tablet 0  . amLODipine (NORVASC) 5 MG tablet Take 1 tablet (5 mg total) by mouth daily. 30 tablet 1  . aspirin 81 MG chewable tablet Chew 81 mg by mouth daily.    Marland Kitchen atorvastatin (LIPITOR) 80 MG tablet Take 80 mg by mouth at bedtime.    . carvedilol (COREG) 12.5 MG tablet Take 1.5 tablets (18.75 mg total) by mouth 2 (two) times daily with a meal. 120 tablet 1  . cholecalciferol (VITAMIN D) 1000 units tablet Take 1,000 Units by mouth daily.    Marland Kitchen ezetimibe (ZETIA) 10 MG tablet Take 1 tablet (10 mg total) by mouth daily. 30 tablet 1  . ferrous sulfate 325 (65 FE) MG tablet Take 325 mg  by mouth once a week.     . furosemide (LASIX) 40 MG tablet Take 1 tablet (40 mg total) by mouth daily as needed. (Patient taking differently: Take 40 mg by mouth daily as needed for fluid or edema. ) 30 tablet 11  . hydrALAZINE (APRESOLINE) 100 MG tablet Take 1 tablet (100 mg total) by mouth 3 (three) times daily. 90 tablet 3  . insulin glargine (LANTUS) 100 UNIT/ML injection Inject 0.25 mLs (25 Units total) into the skin 2 (two) times daily. (Patient  taking differently: Inject 25 Units into the skin at bedtime. ) 10 mL 11  . insulin lispro (HUMALOG) 100 UNIT/ML injection Inject 4 Units into the skin See admin instructions. Below 110 do not take it. 110-115 and above take 4 units    . isosorbide mononitrate (IMDUR) 120 MG 24 hr tablet Take 1 tablet (120 mg total) by mouth daily. 30 tablet 1  . nitroGLYCERIN (NITROSTAT) 0.4 MG SL tablet Place 1 tablet (0.4 mg total) under the tongue every 5 (five) minutes x 3 doses as needed for chest pain. 25 tablet 3  . ticagrelor (BRILINTA) 90 MG TABS tablet Take 1 tablet (90 mg total) by mouth 2 (two) times daily. 60 tablet 11  . HYDROcodone-acetaminophen (NORCO/VICODIN) 5-325 MG tablet      No current facility-administered medications for this visit.     ROS: See HPI for pertinent positives and negatives.   Physical Examination  Vitals:   07/12/18 1358  BP: 122/62  Resp: 16  Temp: 98 F (36.7 C)  TempSrc: Oral  SpO2: 99%  Weight: 159 lb (72.1 kg)  Height: 5' 3.5" (1.613 m)   Body mass index is 27.72 kg/m.  General: A&O x 3, WDWN, female accompanied by her sister.  Gait: seated in her w/c HENT: No gross abnormalities.  Eyes: PERRLA. Pulmonary: Respirations are non labored, CTAB, good air movement in all fields Cardiac: regular rhythm, no detected murmur.         Carotid Bruits Right Left   Negative Negative   Radial pulses are 2+ palpable bilaterally   Adominal aortic pulse is not palpable                         VASCULAR  EXAM: Extremities without ischemic changes, without Gangrene; without open wounds. 1-2+ pitting and non pitting edema in left ankle and foot. Right AKA stump with no lesions.                                                                                                                                                        LE Pulses Right Left       FEMORAL  3+ palpable  2+ palpable        POPLITEAL  AKA   not palpable       POSTERIOR TIBIAL  AKA    not palpable        DORSALIS PEDIS      ANTERIOR TIBIAL AKA   not palpable    Abdomen: softly obese, large panus, RLQ pain on palpation, no palpable masses. Skin: no rashes, no cellulitis, no ulcers noted. Musculoskeletal: no muscle wasting or atrophy.      Neurologic: A&O X 3; appropriate affect, Sensation is normal; MOTOR FUNCTION:  moving all extremities equally, motor strength 4/5 throughout.  Speech is fluent/slightly slow. CN 2-12 intact except has some hearing loss. Psychiatric: Thought content is normal, mood appropriate for clinical situation.     ASSESSMENT: Jenna Brown is a 67 y.o. female whois s/p multiple attempts at revascularization of her right leg for nonhealing wound. On 05/10/2012, she ultimately required a right above-knee amputation.   She rarely walks with her walker, mostly is w/c bound, does not use her right AKA prosthesis as she states it is too heavy. She has no claudication in her left leg, but she does not seem to walk enough to elicit claudication symptoms. There are no signs of ischemia in her left foot or leg.   Her atherosclerotic risk factors include DM, stage 5 CKD not yet on hemodialysis (does not yet have an HD access), and former smoker.  Her right hip, right stump, and RLQ abdominal pain are worsening. She denies any known back problems. She thinks she may still have her appendix. She denies nausea or vomiting. She is afebrile.  Her right femoral pulse is 3+ palpable, left is 2+  palpable, the source of her pain is not from lack of arterial perfusion, to the level of her right groin since her right femoral pulse is 3+ palpable. Her right AKA stump has no signs of ischemia, no wounds.   I advised pt and her sister to seek advice from her PCP to evaluate the source of the pain, and it is also their option to be evaluated in the ED since the pain is worsening.     DATA  ABI (Date: 07/12/2018):  R: AKA  L:   ABI: 0.70 (was 0.96 on 01-02-15),   PT: mono (was bi)  DP: mono (was bi)  TBI: 0.37, toe pressure 66, (was 0.72) Decline in right ABI compared to the last exam four and a half years ago, moderate disease. All monophasic waveforms (were biphasic). Decline in left TBI. Right AKA.    PLAN:  Based on the patient's vascular studies and examination, pt will return to clinic in 6 months with left ABI.   Daily seated leg exercises demonstrated and discussed with pt and her sister.   At her visit on 07-02-18 I referred her back to [facility] for a new prosthesis that is lighter and usable for her, however, this is on hold until her worsening right hip, right stump, and RLQ abdominal pain can be addressed.   I discussed in depth with the patient the nature of atherosclerosis, and emphasized the importance of maximal medical management including strict control of blood pressure, blood glucose, and lipid levels, obtaining regular exercise, and continued cessation of smoking.  The patient is aware that without maximal medical management the underlying atherosclerotic disease process will progress, limiting the benefit of any interventions.  The patient was given information about PAD including signs, symptoms, treatment, what symptoms should prompt the patient to seek immediate medical care, and risk reduction measures to take.  Clemon Chambers, RN, MSN, FNP-C [facility] [employer] Phone: [phone]  Clinic MD: Oneida Alar  07/12/18 2:31  PM

## 2018-07-12 NOTE — ED Triage Notes (Signed)
Pt presents with right hip pain that radiates to the top of the stump. Ongoing for 3 weeks. Denies recent injury or trauma. The patient is alert and oriented x4 with no acute distress at triage.

## 2018-07-12 NOTE — Patient Instructions (Signed)

## 2018-07-12 NOTE — ED Notes (Signed)
Patient verbalizes understanding of discharge instructions. Opportunity for questioning and answers were provided. Armband removed by staff, pt discharged from ED home via POV.  

## 2018-07-17 DIAGNOSIS — N189 Chronic kidney disease, unspecified: Secondary | ICD-10-CM | POA: Diagnosis not present

## 2018-08-01 ENCOUNTER — Ambulatory Visit (HOSPITAL_BASED_OUTPATIENT_CLINIC_OR_DEPARTMENT_OTHER)
Admission: RE | Admit: 2018-08-01 | Discharge: 2018-08-01 | Disposition: A | Discharge status: Home / Self Care | Payer: Medicare Other | Source: Ambulatory Visit | Attending: Internal Medicine | Admitting: Internal Medicine

## 2018-08-01 ENCOUNTER — Ambulatory Visit (HOSPITAL_COMMUNITY)
Admission: RE | Admit: 2018-08-01 | Discharge: 2018-08-01 | Disposition: A | Discharge status: Home / Self Care | Payer: Medicare Other | Source: Ambulatory Visit | Attending: Adult Health | Admitting: Adult Health

## 2018-08-01 ENCOUNTER — Encounter (HOSPITAL_COMMUNITY): Payer: Self-pay | Admitting: Internal Medicine

## 2018-08-01 VITALS — BP 182/90 | HR 75

## 2018-08-01 DIAGNOSIS — Z87891 Personal history of nicotine dependence: Secondary | ICD-10-CM | POA: Insufficient documentation

## 2018-08-01 DIAGNOSIS — N185 Chronic kidney disease, stage 5: Secondary | ICD-10-CM | POA: Insufficient documentation

## 2018-08-01 DIAGNOSIS — Z794 Long term (current) use of insulin: Secondary | ICD-10-CM | POA: Insufficient documentation

## 2018-08-01 DIAGNOSIS — I255 Ischemic cardiomyopathy: Secondary | ICD-10-CM | POA: Insufficient documentation

## 2018-08-01 DIAGNOSIS — I2510 Atherosclerotic heart disease of native coronary artery without angina pectoris: Secondary | ICD-10-CM | POA: Insufficient documentation

## 2018-08-01 DIAGNOSIS — M1990 Unspecified osteoarthritis, unspecified site: Secondary | ICD-10-CM | POA: Diagnosis not present

## 2018-08-01 DIAGNOSIS — I252 Old myocardial infarction: Secondary | ICD-10-CM | POA: Diagnosis not present

## 2018-08-01 DIAGNOSIS — I10 Essential (primary) hypertension: Secondary | ICD-10-CM | POA: Diagnosis not present

## 2018-08-01 DIAGNOSIS — Z993 Dependence on wheelchair: Secondary | ICD-10-CM | POA: Diagnosis not present

## 2018-08-01 DIAGNOSIS — Z7982 Long term (current) use of aspirin: Secondary | ICD-10-CM | POA: Insufficient documentation

## 2018-08-01 DIAGNOSIS — I132 Hypertensive heart and chronic kidney disease with heart failure and with stage 5 chronic kidney disease, or end stage renal disease: Secondary | ICD-10-CM | POA: Insufficient documentation

## 2018-08-01 DIAGNOSIS — Z89611 Acquired absence of right leg above knee: Secondary | ICD-10-CM | POA: Insufficient documentation

## 2018-08-01 DIAGNOSIS — Z79899 Other long term (current) drug therapy: Secondary | ICD-10-CM | POA: Insufficient documentation

## 2018-08-01 DIAGNOSIS — E785 Hyperlipidemia, unspecified: Secondary | ICD-10-CM | POA: Insufficient documentation

## 2018-08-01 DIAGNOSIS — Z8673 Personal history of transient ischemic attack (TIA), and cerebral infarction without residual deficits: Secondary | ICD-10-CM | POA: Insufficient documentation

## 2018-08-01 DIAGNOSIS — E1151 Type 2 diabetes mellitus with diabetic peripheral angiopathy without gangrene: Secondary | ICD-10-CM | POA: Insufficient documentation

## 2018-08-01 DIAGNOSIS — K219 Gastro-esophageal reflux disease without esophagitis: Secondary | ICD-10-CM | POA: Diagnosis not present

## 2018-08-01 DIAGNOSIS — I5022 Chronic systolic (congestive) heart failure: Secondary | ICD-10-CM | POA: Diagnosis not present

## 2018-08-01 DIAGNOSIS — F419 Anxiety disorder, unspecified: Secondary | ICD-10-CM | POA: Diagnosis not present

## 2018-08-01 DIAGNOSIS — K449 Diaphragmatic hernia without obstruction or gangrene: Secondary | ICD-10-CM | POA: Insufficient documentation

## 2018-08-01 DIAGNOSIS — E1122 Type 2 diabetes mellitus with diabetic chronic kidney disease: Secondary | ICD-10-CM | POA: Diagnosis not present

## 2018-08-01 MED ORDER — CLONIDINE HCL 0.2 MG PO TABS
0.2000 mg | ORAL_TABLET | Freq: Once | ORAL | Status: AC
  Administered 2018-08-01: 0.2 mg via ORAL
  Filled 2018-08-01: qty 1

## 2018-08-01 MED ORDER — DOXAZOSIN MESYLATE 2 MG PO TABS: 2.0000 mg | ORAL_TABLET | Freq: Every day | ORAL | 3 refills | Status: DC

## 2018-08-01 NOTE — Patient Instructions (Signed)
GIVEN clonidine 0.2 mg today  START Doxazosin 2mg  (1 tab) daily  You have been referred to [place] at [facility] to Dr. Oval Linsey. Follow up with them in 1 month.  Follow up with your primary care provider in 1 week.

## 2018-08-01 NOTE — Addendum Note (Signed)
Encounter addended by: Marlise Eves, RN on: 08/01/2018 11:54 AM  Actions taken: Vitals modified

## 2018-08-01 NOTE — Progress Notes (Signed)
  Echocardiogram 2D Echocardiogram has been performed.  Jenna Brown G Jenna Brown 08/01/2018, 10:08 AM

## 2018-08-01 NOTE — Progress Notes (Signed)
Advanced Heart Failure Clinic Note   Patient ID: Jenna Brown, female   DOB: Mar 08, 1951, 67 y.o.   MRN: [id number]   PCP: Dr. Vista Lawman Primary HF Cardiologist: Dr Haroldine Laws Renal:  Hulen Shouts [phone])   HPI: Jenna Brown is a 67 y.o. female with hyperlipidemia, CVA, hypertension, type 2 diabetes mellitus, severe CAD, right AKA .   Admitted to [facility] 04/10/15 with increased dyspnea. Had RHC/LHC as noted below. Not candidate for CABG per Dr Prescott Gum due to comorbidities and poor targets. Required short term milrinone for cardiogenic shock. As she improved and fully diuresed, milrinone stopped. Discharge weight was 158 pounds.     Admitted 4/8 -> 01/03/2017 with hypertensive emergency and AKI. BP up to 225/98 Troponin max to 4.62 felt to be demand ischemia. Started on Nitro + heparin gtt. Lasix and losartan held in AKI.  Renal consulted. Medications heavily adjusted.   Admitted 03/23/17 - 03/30/17 with NSTEMI. She had not been taking her Brilinta correctly. Decision was made to treat her medically as it was felt that she was at high risk of CIN with her CKD. Her isosorbide was increased to 90 mg daily. Her chest pain improved with increased isosorbide, creatinine was as high as 3.33. Her losartan was restarted at discharge after AKI resolved.   Admitted 01/11/18 with chest pain with NSTEMI. This was worse than her typical chest pain, which she gets 1-2 times weekly. Peak troponin 10.54. Not taken for cath with CKD IV-V. Treated with IV nitro and heparin. Not candidate for CABG per Dr. Prescott Gum.  Meds adjusted and pt remained asymptomatic for the remainder of visit.   Admitted 04/2018 with increased dyspnea and chest pain. NSTEMI . Troponin peaked 2.26. Reviewed by Dr Martinique with recommendations for ongoing medical therapy. Creatinine on the day discharge was 4.2.   Today she returns for HF follow up. Overall feeling fine. Denies CP, SOB/PND/Orthopnea.. She is wheel chair bound. Minimal edema in  left foot. Resolves with diuretic which she takes 3-4 times per week  Unable to use prosthetic. Following with Nephrology in [place] and getting ready for HD but has not had access yet.  Last creatinine was 4.2.   Echo today EF 45% Personally reviewed   Studies:   Echo 12/27/16 LVEF 45-50%, diffuse hypokinesis, Grade 1 DD and ? Mass on left atrial wall. TEE 01/03/17 45%, trivial MR and TR, no LA/LAA thrombus or mass, negative bubble study.  Echo 03/2017 Left ventricle: LVEF is approximately 45 to 50% - Aortic valve: AV is thickened, calcified In the parasternal view   there is an nodular echo bright segment (18 x 7 mm) May represent   nodular thickening of valve Not convincing for discrete mass. It   appears to be present on TTE in April 2018 TEE after that showed   no abnormalitiy to AV.   RHC/LHC 04/14/15  Prox LAD to Mid LAD lesion, 90% stenosed. Diffuse disease  Ost Cx to Mid Cx lesion, 95% stenosed.  Ost 2nd Mrg to 2nd Mrg lesion, 80% stenosed.  Mid Cx lesion, 90% stenosed.  Prox RCA lesion, 60% stenosed.  Mid RCA lesion, 50% stenosed.  Review of systems complete and found to be negative unless listed in HPI.   SH:  Social History   Socioeconomic History  . Marital status: Single    Spouse name: Not on file  . Number of children: Not on file  . Years of education: Not on file  . Highest education level: Not  on file  Occupational History  . Occupation: disabled  Social Needs  . Financial resource strain: Not on file  . Food insecurity:    Worry: Not on file    Inability: Not on file  . Transportation needs:    Medical: Not on file    Non-medical: Not on file  Tobacco Use  . Smoking status: Former Smoker    Packs/day: 0.25    Years: 40.00    Pack years: 10.00    Types: Cigarettes    Last attempt to quit: 02/27/2012    Years since quitting: 6.4  . Smokeless tobacco: Never Used  Substance and Sexual Activity  . Alcohol use: No  . Drug use: No  . Sexual  activity: Never    Birth control/protection: Post-menopausal  Lifestyle  . Physical activity:    Days per week: Not on file    Minutes per session: Not on file  . Stress: Not on file  Relationships  . Social connections:    Talks on phone: Not on file    Gets together: Not on file    Attends religious service: Not on file    Active member of club or organization: Not on file    Attends meetings of clubs or organizations: Not on file    Relationship status: Not on file  . Intimate partner violence:    Fear of current or ex partner: Not on file    Emotionally abused: Not on file    Physically abused: Not on file    Forced sexual activity: Not on file  Other Topics Concern  . Not on file  Social History Narrative   She lives in [place] with family.   FH:  Family History  Problem Relation Age of Onset  . Cancer Mother        breast  . Diabetes Father     Past Medical History:  Diagnosis Date  . Anemia   . Anxiety    takes Xanax prn  . Arthritis   . CKD (chronic kidney disease), stage IV ([place])   . Coronary artery disease 2016   cath w/ 90% LAD, 95%CFX, 80% OM 2, 60% RCA, not CABG candidate, rx medically  . GERD (gastroesophageal reflux disease)   . H/O hiatal hernia   . Headache(784.0)    when b/p is elevated  . Hemorrhoids   . Hx of AKA (above knee amputation), right ([place])   . Hyperlipidemia    takes Simvastatin daily  . Hypertension    takes Amlodipine/HCTZ/Losartan daily  . Migraines   . Myocardial infarction ([place]) 1990's  . NSTEMI (non-ST elevated myocardial infarction) ([place])   . Peripheral vascular disease ([place])   . PONV (postoperative nausea and vomiting)   . Stroke [facility])    TIA history  . Type II diabetes mellitus (HCC)    on Lantus  . Vertigo    takes Ativert prn    Current Outpatient Medications  Medication Sig Dispense Refill  . acetaminophen (TYLENOL) 650 MG CR tablet Take 650 mg by mouth every 8 (eight) hours as needed for pain.     Marland Kitchen  ALPRAZolam (XANAX) 0.25 MG tablet Take 1 tablet (0.25 mg total) by mouth 3 (three) times daily as needed for anxiety. 12 tablet 0  . amLODipine (NORVASC) 5 MG tablet Take 1 tablet (5 mg total) by mouth daily. 30 tablet 1  . aspirin 81 MG chewable tablet Chew 81 mg by mouth daily.    Marland Kitchen atorvastatin (LIPITOR) 80  MG tablet Take 80 mg by mouth at bedtime.    . carvedilol (COREG) 12.5 MG tablet Take 1.5 tablets (18.75 mg total) by mouth 2 (two) times daily with a meal. 120 tablet 1  . cholecalciferol (VITAMIN D) 1000 units tablet Take 1,000 Units by mouth daily.    Marland Kitchen ezetimibe (ZETIA) 10 MG tablet Take 1 tablet (10 mg total) by mouth daily. 30 tablet 1  . ferrous sulfate 325 (65 FE) MG tablet Take 325 mg by mouth once a week.     . furosemide (LASIX) 40 MG tablet Take 1 tablet (40 mg total) by mouth daily as needed. (Patient taking differently: Take 40 mg by mouth daily as needed for fluid or edema. ) 30 tablet 11  . hydrALAZINE (APRESOLINE) 100 MG tablet Take 1 tablet (100 mg total) by mouth 3 (three) times daily. 90 tablet 3  . HYDROcodone-acetaminophen (NORCO/VICODIN) 5-325 MG tablet     . insulin glargine (LANTUS) 100 UNIT/ML injection Inject 0.25 mLs (25 Units total) into the skin 2 (two) times daily. (Patient taking differently: Inject 25 Units into the skin at bedtime. ) 10 mL 11  . insulin lispro (HUMALOG) 100 UNIT/ML injection Inject 4 Units into the skin See admin instructions. Below 110 do not take it. 110-115 and above take 4 units    . isosorbide mononitrate (IMDUR) 120 MG 24 hr tablet Take 1 tablet (120 mg total) by mouth daily. 30 tablet 1  . nitroGLYCERIN (NITROSTAT) 0.4 MG SL tablet Place 1 tablet (0.4 mg total) under the tongue every 5 (five) minutes x 3 doses as needed for chest pain. 25 tablet 3  . ticagrelor (BRILINTA) 90 MG TABS tablet Take 1 tablet (90 mg total) by mouth 2 (two) times daily. 60 tablet 11   No current facility-administered medications for this encounter.     Vitals:   08/01/18 1009  BP: (!) 220/98  Pulse: 75  SpO2: 96%    Wt Readings from Last 3 Encounters:  07/12/18 72.1 kg (159 lb)  07/02/18 72.6 kg (160 lb)  05/11/18 72.8 kg (160 lb 9.6 oz)    PHYSICAL EXAM: General:  Well appearing. Sitting in [place]. No resp difficulty HEENT: normal Neck: supple. no JVD. Carotids 2+ bilat; no bruits. No lymphadenopathy or thryomegaly appreciated. Cor: PMI nondisplaced. Regular rate & rhythm. No rubs, gallops or murmurs. Lungs: clear Abdomen: soft, nontender, nondistended. No hepatosplenomegaly. No bruits or masses. Good bowel sounds. Extremities: no cyanosis, clubbing, rash,  Right AKA no edema on left Neuro: alert & orientedx3, cranial nerves grossly intact. moves all 4 extremities w/o difficulty. Affect pleasant  ECG: NSR 68 Non-specific ST abnormalities. Personally reviewed   ASSESSMENT & PLAN:  1. Chronic systolic CHF: ICM.  - Echo 03/24/17 EF 45-50%. Echo today EF stable 45-50% - NYHA II. Limited by AKA - Volume status stable. Continue lasix as needed.   - Continue Coreg 12.5 mg BID -Continue hydralazine to 100 mg TID. - Continue Imdur 90 mg daily.  - No ARB with CKD Stage IV.  - No need for further f/u in HF Clinic with stable EF and volume status  2.CAD - has had multiple NSTEMIs. Currently no s/s ischemia - LHC 2016 with prox to mid 90% LAD lesion, ostial LCx 95% stenosed. Prox RCA 60%. Not candidate for CABG or PCI. NSTEM 04/2018. Dr Martinique reviewed last [facility] and recommended medical management.   - Continue ASA, b-blocker and Brilinta.  - Continue lipitor.    3. PAD s/p R  AKA - Stable. No change.   4. DMII  - Follows with PCP. No change.   5. TIA  - No recent symptoms.  - Continue statin and ASA.   6. CKD Stage V - Sees Dr. Joesph July in [facility].  - Creatinine baseline around 4.  -She has plans to start dialysis soon.    7. HTN - BP markedly elevated in setting of not taking meds this am.Says SBP 160-170 at home - No  focal neuro signs currently.  - Given clonidine 0.2mg  in clinic - Add doxazosin 0.2 - Will arrange f/u with [facility] to assume general cardiology care.    Glori Bickers, MD  10:13 AM

## 2018-08-10 DIAGNOSIS — E119 Type 2 diabetes mellitus without complications: Secondary | ICD-10-CM | POA: Diagnosis not present

## 2018-08-10 DIAGNOSIS — I10 Essential (primary) hypertension: Secondary | ICD-10-CM | POA: Diagnosis not present

## 2018-08-10 DIAGNOSIS — N189 Chronic kidney disease, unspecified: Secondary | ICD-10-CM | POA: Diagnosis not present

## 2018-08-10 DIAGNOSIS — N184 Chronic kidney disease, stage 4 (severe): Secondary | ICD-10-CM | POA: Diagnosis not present

## 2018-08-10 DIAGNOSIS — D649 Anemia, unspecified: Secondary | ICD-10-CM | POA: Diagnosis not present

## 2018-08-10 DIAGNOSIS — E7800 Pure hypercholesterolemia, unspecified: Secondary | ICD-10-CM | POA: Diagnosis not present

## 2018-08-14 DIAGNOSIS — E1122 Type 2 diabetes mellitus with diabetic chronic kidney disease: Secondary | ICD-10-CM | POA: Diagnosis not present

## 2018-08-14 DIAGNOSIS — I120 Hypertensive chronic kidney disease with stage 5 chronic kidney disease or end stage renal disease: Secondary | ICD-10-CM | POA: Diagnosis not present

## 2018-08-14 DIAGNOSIS — N186 End stage renal disease: Secondary | ICD-10-CM | POA: Diagnosis not present

## 2018-08-14 DIAGNOSIS — Z794 Long term (current) use of insulin: Secondary | ICD-10-CM | POA: Diagnosis not present

## 2018-08-14 DIAGNOSIS — Z79899 Other long term (current) drug therapy: Secondary | ICD-10-CM | POA: Diagnosis not present

## 2018-09-10 DIAGNOSIS — N189 Chronic kidney disease, unspecified: Secondary | ICD-10-CM | POA: Diagnosis not present

## 2018-09-10 DIAGNOSIS — Z79899 Other long term (current) drug therapy: Secondary | ICD-10-CM | POA: Diagnosis not present

## 2018-09-14 ENCOUNTER — Inpatient Hospital Stay (HOSPITAL_COMMUNITY)
Admission: EM | Admit: 2018-09-14 | Discharge: 2018-09-16 | DRG: 281 | Disposition: A | Discharge status: Home / Self Care | Payer: Medicare Other | Attending: Internal Medicine | Admitting: Internal Medicine

## 2018-09-14 ENCOUNTER — Encounter (HOSPITAL_COMMUNITY)
Admission: EM | Disposition: A | Discharge status: Home / Self Care | Payer: Self-pay | Source: Home / Self Care | Attending: Internal Medicine

## 2018-09-14 ENCOUNTER — Emergency Department (HOSPITAL_COMMUNITY): Payer: Medicare Other

## 2018-09-14 DIAGNOSIS — E876 Hypokalemia: Secondary | ICD-10-CM | POA: Diagnosis present

## 2018-09-14 DIAGNOSIS — D649 Anemia, unspecified: Secondary | ICD-10-CM | POA: Diagnosis present

## 2018-09-14 DIAGNOSIS — I10 Essential (primary) hypertension: Secondary | ICD-10-CM | POA: Diagnosis present

## 2018-09-14 DIAGNOSIS — E875 Hyperkalemia: Secondary | ICD-10-CM | POA: Diagnosis present

## 2018-09-14 DIAGNOSIS — Z79899 Other long term (current) drug therapy: Secondary | ICD-10-CM

## 2018-09-14 DIAGNOSIS — I2510 Atherosclerotic heart disease of native coronary artery without angina pectoris: Secondary | ICD-10-CM | POA: Diagnosis present

## 2018-09-14 DIAGNOSIS — N185 Chronic kidney disease, stage 5: Secondary | ICD-10-CM | POA: Diagnosis not present

## 2018-09-14 DIAGNOSIS — I252 Old myocardial infarction: Secondary | ICD-10-CM

## 2018-09-14 DIAGNOSIS — I132 Hypertensive heart and chronic kidney disease with heart failure and with stage 5 chronic kidney disease, or end stage renal disease: Secondary | ICD-10-CM | POA: Diagnosis present

## 2018-09-14 DIAGNOSIS — I200 Unstable angina: Secondary | ICD-10-CM

## 2018-09-14 DIAGNOSIS — I214 Non-ST elevation (NSTEMI) myocardial infarction: Principal | ICD-10-CM | POA: Diagnosis present

## 2018-09-14 DIAGNOSIS — R0602 Shortness of breath: Secondary | ICD-10-CM | POA: Diagnosis not present

## 2018-09-14 DIAGNOSIS — E785 Hyperlipidemia, unspecified: Secondary | ICD-10-CM | POA: Diagnosis present

## 2018-09-14 DIAGNOSIS — Z8673 Personal history of transient ischemic attack (TIA), and cerebral infarction without residual deficits: Secondary | ICD-10-CM

## 2018-09-14 DIAGNOSIS — E1122 Type 2 diabetes mellitus with diabetic chronic kidney disease: Secondary | ICD-10-CM | POA: Diagnosis present

## 2018-09-14 DIAGNOSIS — Z89611 Acquired absence of right leg above knee: Secondary | ICD-10-CM

## 2018-09-14 DIAGNOSIS — I5042 Chronic combined systolic (congestive) and diastolic (congestive) heart failure: Secondary | ICD-10-CM | POA: Diagnosis present

## 2018-09-14 DIAGNOSIS — I160 Hypertensive urgency: Secondary | ICD-10-CM | POA: Diagnosis present

## 2018-09-14 DIAGNOSIS — R079 Chest pain, unspecified: Secondary | ICD-10-CM | POA: Diagnosis not present

## 2018-09-14 DIAGNOSIS — E1151 Type 2 diabetes mellitus with diabetic peripheral angiopathy without gangrene: Secondary | ICD-10-CM | POA: Diagnosis not present

## 2018-09-14 DIAGNOSIS — I5022 Chronic systolic (congestive) heart failure: Secondary | ICD-10-CM | POA: Diagnosis not present

## 2018-09-14 DIAGNOSIS — I639 Cerebral infarction, unspecified: Secondary | ICD-10-CM | POA: Diagnosis not present

## 2018-09-14 DIAGNOSIS — R000 Tachycardia, unspecified: Secondary | ICD-10-CM | POA: Diagnosis not present

## 2018-09-14 DIAGNOSIS — R0789 Other chest pain: Secondary | ICD-10-CM | POA: Diagnosis not present

## 2018-09-14 DIAGNOSIS — Z833 Family history of diabetes mellitus: Secondary | ICD-10-CM

## 2018-09-14 DIAGNOSIS — I213 ST elevation (STEMI) myocardial infarction of unspecified site: Secondary | ICD-10-CM | POA: Diagnosis not present

## 2018-09-14 DIAGNOSIS — D509 Iron deficiency anemia, unspecified: Secondary | ICD-10-CM | POA: Diagnosis present

## 2018-09-14 DIAGNOSIS — Z7982 Long term (current) use of aspirin: Secondary | ICD-10-CM

## 2018-09-14 DIAGNOSIS — Z87891 Personal history of nicotine dependence: Secondary | ICD-10-CM

## 2018-09-14 DIAGNOSIS — N186 End stage renal disease: Secondary | ICD-10-CM | POA: Diagnosis present

## 2018-09-14 DIAGNOSIS — D631 Anemia in chronic kidney disease: Secondary | ICD-10-CM | POA: Diagnosis present

## 2018-09-14 DIAGNOSIS — F419 Anxiety disorder, unspecified: Secondary | ICD-10-CM | POA: Diagnosis present

## 2018-09-14 DIAGNOSIS — Z7902 Long term (current) use of antithrombotics/antiplatelets: Secondary | ICD-10-CM

## 2018-09-14 LAB — CBC WITH DIFFERENTIAL/PLATELET
Abs Immature Granulocytes: 0.09 10*3/uL — ABNORMAL HIGH (ref 0.00–0.07)
Basophils Absolute: 0 10*3/uL (ref 0.0–0.1)
Basophils Relative: 1 %
Eosinophils Absolute: 0.1 10*3/uL (ref 0.0–0.5)
Eosinophils Relative: 1 %
HCT: 35.3 % — ABNORMAL LOW (ref 36.0–46.0)
Hemoglobin: 9.5 g/dL — ABNORMAL LOW (ref 12.0–15.0)
Immature Granulocytes: 1 %
Lymphocytes Relative: 27 %
Lymphs Abs: 2.4 10*3/uL (ref 0.7–4.0)
MCH: 24.9 pg — ABNORMAL LOW (ref 26.0–34.0)
MCHC: 26.9 g/dL — ABNORMAL LOW (ref 30.0–36.0)
MCV: 92.4 fL (ref 80.0–100.0)
MONOS PCT: 8 %
Monocytes Absolute: 0.7 10*3/uL (ref 0.1–1.0)
NEUTROS PCT: 62 %
Neutro Abs: 5.5 10*3/uL (ref 1.7–7.7)
Platelets: 285 10*3/uL (ref 150–400)
RBC: 3.82 MIL/uL — ABNORMAL LOW (ref 3.87–5.11)
RDW: 14.7 % (ref 11.5–15.5)
WBC: 8.8 10*3/uL (ref 4.0–10.5)
nRBC: 0 % (ref 0.0–0.2)

## 2018-09-14 LAB — COMPREHENSIVE METABOLIC PANEL
ALT: 38 U/L (ref 0–44)
AST: 32 U/L (ref 15–41)
Albumin: 3.2 g/dL — ABNORMAL LOW (ref 3.5–5.0)
Alkaline Phosphatase: 83 U/L (ref 38–126)
Anion gap: 8 (ref 5–15)
BUN: 42 mg/dL — ABNORMAL HIGH (ref 8–23)
CO2: 19 mmol/L — ABNORMAL LOW (ref 22–32)
Calcium: 8.5 mg/dL — ABNORMAL LOW (ref 8.9–10.3)
Chloride: 115 mmol/L — ABNORMAL HIGH (ref 98–111)
Creatinine, Ser: 3.77 mg/dL — ABNORMAL HIGH (ref 0.44–1.00)
GFR, EST AFRICAN AMERICAN: 14 mL/min — AB (ref >60)
GFR, EST NON AFRICAN AMERICAN: 12 mL/min — AB (ref >60)
Glucose, Bld: 205 mg/dL — ABNORMAL HIGH (ref 70–99)
Potassium: 5.3 mmol/L — ABNORMAL HIGH (ref 3.5–5.1)
Sodium: 142 mmol/L (ref 135–145)
Total Bilirubin: 0.8 mg/dL (ref 0.3–1.2)
Total Protein: 6.3 g/dL — ABNORMAL LOW (ref 6.5–8.1)

## 2018-09-14 LAB — I-STAT CHEM 8, ED
BUN: 49 mg/dL — ABNORMAL HIGH (ref 8–23)
Calcium, Ion: 1.03 mmol/L — ABNORMAL LOW (ref 1.15–1.40)
Chloride: 117 mmol/L — ABNORMAL HIGH (ref 98–111)
Creatinine, Ser: 3.8 mg/dL — ABNORMAL HIGH (ref 0.44–1.00)
Glucose, Bld: 197 mg/dL — ABNORMAL HIGH (ref 70–99)
HCT: 32 % — ABNORMAL LOW (ref 36.0–46.0)
HEMOGLOBIN: 10.9 g/dL — AB (ref 12.0–15.0)
POTASSIUM: 5.4 mmol/L — AB (ref 3.5–5.1)
Sodium: 143 mmol/L (ref 135–145)
TCO2: 20 mmol/L — ABNORMAL LOW (ref 22–32)

## 2018-09-14 LAB — URINALYSIS, ROUTINE W REFLEX MICROSCOPIC
Bilirubin Urine: NEGATIVE
Glucose, UA: 50 mg/dL — AB
Hgb urine dipstick: NEGATIVE
Ketones, ur: 5 mg/dL — AB
Nitrite: NEGATIVE
Protein, ur: 100 mg/dL — AB
Specific Gravity, Urine: 1.014 (ref 1.005–1.030)
pH: 7 (ref 5.0–8.0)

## 2018-09-14 LAB — PROTIME-INR
INR: 0.98
Prothrombin Time: 12.9 seconds (ref 11.4–15.2)

## 2018-09-14 LAB — I-STAT TROPONIN, ED: TROPONIN I, POC: 0.07 ng/mL (ref 0.00–0.08)

## 2018-09-14 LAB — LIPASE, BLOOD: LIPASE: 60 U/L — AB (ref 11–51)

## 2018-09-14 SURGERY — Surgical Case

## 2018-09-14 MED ORDER — HEPARIN (PORCINE) 25000 UT/250ML-% IV SOLN
850.0000 [IU]/h | INTRAVENOUS | Status: DC
  Administered 2018-09-14 – 2018-09-16 (×2): 850 [IU]/h via INTRAVENOUS
  Filled 2018-09-14 (×2): qty 250

## 2018-09-14 MED ORDER — HEPARIN SODIUM (PORCINE) 1000 UNIT/ML IJ SOLN
4000.0000 [IU] | Freq: Once | INTRAMUSCULAR | Status: AC
  Administered 2018-09-14: 4000 [IU] via INTRAVENOUS
  Filled 2018-09-14: qty 4

## 2018-09-14 MED ORDER — ONDANSETRON HCL 4 MG/2ML IJ SOLN
INTRAMUSCULAR | Status: AC
  Filled 2018-09-14: qty 2

## 2018-09-14 MED ORDER — NITROGLYCERIN IN D5W 200-5 MCG/ML-% IV SOLN
0.0000 ug/min | INTRAVENOUS | Status: DC
  Administered 2018-09-14: 5 ug/min via INTRAVENOUS
  Administered 2018-09-15: 150 ug/min via INTRAVENOUS
  Filled 2018-09-14 (×3): qty 250

## 2018-09-14 MED ORDER — FENTANYL CITRATE (PF) 100 MCG/2ML IJ SOLN
INTRAMUSCULAR | Status: AC
  Filled 2018-09-14: qty 2

## 2018-09-14 MED ORDER — FENTANYL CITRATE (PF) 100 MCG/2ML IJ SOLN
50.0000 ug | Freq: Once | INTRAMUSCULAR | Status: AC
  Administered 2018-09-14: 50 ug via INTRAVENOUS

## 2018-09-14 MED ORDER — ONDANSETRON HCL 4 MG/2ML IJ SOLN
4.0000 mg | Freq: Once | INTRAMUSCULAR | Status: AC
  Administered 2018-09-14: 4 mg via INTRAVENOUS

## 2018-09-14 MED ORDER — SODIUM POLYSTYRENE SULFONATE 15 GM/60ML PO SUSP
30.0000 g | Freq: Once | ORAL | Status: AC
  Administered 2018-09-14: 30 g via ORAL
  Filled 2018-09-14: qty 120

## 2018-09-14 NOTE — ED Triage Notes (Addendum)
Patient arrived with EMS from home reports left lower chest pain onset this evening with emesis and mild SOB . She received 3 NTG, Zofran 4 mg IV  and 6 ASA tabs prior to arrival .

## 2018-09-14 NOTE — ED Provider Notes (Signed)
[facility] EMERGENCY DEPARTMENT Provider Note   CSN: [id number] Arrival date & time: 09/14/18  2059     History   Chief Complaint Chief Complaint  Patient presents with  . Chest Pain    HPI Jenna Brown is a 67 y.o. female.  The history is provided by the patient. No language interpreter was used.  Chest Pain     Jenna Brown is a 67 y.o. female who presents to the Emergency Department complaining of chest pain. She presents to the emergency department complaining of chest pain that began at 6 PM this evening. Pain is described as a central chest heaviness with associated nausea and vomiting. Initially pain was 8/10. She took two of her nitroglycerin at home with improvement in her pain to 6/10. She denies any fevers, diarrhea, shortness of breath. EMS activated a STEMI alert prior to ED arrival. She denies any recent medication changes or missed medications.  Sxs are severe and constant in nature.   Past Medical History:  Diagnosis Date  . Anemia   . Anxiety    takes Xanax prn  . Arthritis   . CKD (chronic kidney disease), stage IV ([place])   . Coronary artery disease 2016   cath w/ 90% LAD, 95%CFX, 80% OM 2, 60% RCA, not CABG candidate, rx medically  . GERD (gastroesophageal reflux disease)   . H/O hiatal hernia   . Headache(784.0)    when b/p is elevated  . Hemorrhoids   . Hx of AKA (above knee amputation), right ([place])   . Hyperlipidemia    takes Simvastatin daily  . Hypertension    takes Amlodipine/HCTZ/Losartan daily  . Migraines   . Myocardial infarction ([place]) 1990's  . NSTEMI (non-ST elevated myocardial infarction) ([place])   . Peripheral vascular disease ([place])   . PONV (postoperative nausea and vomiting)   . Stroke [facility])    TIA history  . Type II diabetes mellitus (HCC)    on Lantus  . Vertigo    takes Ativert prn    Patient Active Problem List   Diagnosis Date Noted  . Chest pain with high risk for cardiac etiology   . Coronary  artery disease   . Unstable angina pectoris due to coronary arteriosclerosis ([place]) 04/19/2018  . Goals of care, counseling/discussion   . Palliative care by specialist   . CKD (chronic kidney disease) stage 5, GFR less than 15 ml/min (HCC)   . Insulin-requiring or dependent type II diabetes mellitus ([place]) 01/11/2018  . Cardiac mass   . Chronic kidney disease (CKD), stage IV (severe) ([place]) 12/26/2016  . Hypertensive emergency   . Chest pain 08/11/2015  . CAD in native artery   . Cardiomyopathy, ischemic 05/14/2015  . TIA (transient ischemic attack) 05/14/2015  . PAD (peripheral artery disease) ([place]) 05/14/2015  . Chronic systolic heart failure ([place]) 05/14/2015  . Coronary artery disease due to lipid rich plaque   . Cardiac enzymes elevated   . Non-ST elevation (NSTEMI) myocardial infarction ([place])   . Essential hypertension   . Hyperlipidemia LDL goal <70   . Anemia 05/10/2012  . Anxiety 05/09/2012  . 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 Stroke ([place])   . Peripheral vascular disease [facility])     Past Surgical History:  Procedure Laterality Date  . ABDOMINAL AORTAGRAM N/A 02/29/2012   Procedure: ABDOMINAL Maxcine Ham;  Surgeon: [name], MD;  Location: [facility] CATH LAB;  Service: Cardiovascular;  Laterality: N/A;  .  AMPUTATION  05/10/2012   Procedure: AMPUTATION ABOVE KNEE;  Surgeon: [name], MD;  Location: [facility] OR;  Service: Vascular;  Laterality: Right;   open right groin wound noted  . aortogram  02/2012  . CARDIAC CATHETERIZATION N/A 04/13/2015   Procedure: Right/Left Heart Cath and Coronary Angiography;  Surgeon: [name], MD;  Location: [facility] CV LAB;  Service: Cardiovascular;  Laterality: N/A;  . CLEFT PALATE REPAIR     several  . COLONOSCOPY    . DILATION AND CURETTAGE OF UTERUS    . FEMORAL-POPLITEAL BYPASS GRAFT  04/05/2012   Procedure: BYPASS GRAFT FEMORAL-POPLITEAL ARTERY;  Surgeon: [name], MD;  Location: [facility];   Service: Vascular;  Laterality: Right;  REVISION  . FEMORAL-TIBIAL BYPASS GRAFT  03/29/2012   Procedure: BYPASS GRAFT FEMORAL-TIBIAL ARTERY;  Surgeon: [name], MD;  Location: [facility] OR;  Service: Vascular;  Laterality: Right;  Right femoral to Posterior Tibialis with composite graft of 19mm x 80 cm ringed gortex graft and saphenous vein  ,intraoperative arteriogram  . FEMOROPOPLITEAL THROMBECTOMY / EMBOLECTOMY  05/09/2012  . MYOMECTOMY    . PR VEIN BYPASS GRAFT,AORTO-FEM-POP  05/27/11   Right SFA-Below knee Pop BP  . TEE WITHOUT CARDIOVERSION N/A 01/03/2017   Procedure: TRANSESOPHAGEAL ECHOCARDIOGRAM (TEE);  Surgeon: Skeet Latch, MD;  Location: [facility] ENDOSCOPY;  Service: Cardiovascular;  Laterality: N/A;  . TONSILLECTOMY    . TUBAL LIGATION  ~ 1990     OB History   No obstetric history on file.      Home Medications    Prior to Admission medications   Medication Sig Start Date End Date Taking? Authorizing Provider  acetaminophen (TYLENOL) 500 MG tablet Take 500 mg by mouth every 6 (six) hours as needed for mild pain.   Yes [provider]  ALPRAZolam (XANAX) 0.25 MG tablet Take 1 tablet (0.25 mg total) by mouth 3 (three) times daily as needed for anxiety. Patient taking differently: Take 0.25 mg by mouth daily as needed for anxiety.  01/03/17  Yes Carlota Raspberry, Tiffany, PA-C  aspirin 81 MG chewable tablet Chew 81 mg by mouth daily.   Yes [provider]  atorvastatin (LIPITOR) 80 MG tablet Take 80 mg by mouth daily.  01/09/18  Yes [provider]  Cholecalciferol (VITAMIN D-3) 25 MCG (1000 UT) CAPS Take 1,000 Units by mouth daily.   Yes [provider]  doxazosin (CARDURA) 2 MG tablet Take 1 tablet (2 mg total) by mouth daily. Patient taking differently: Take 1 mg by mouth at bedtime.  08/01/18  Yes Bensimhon, Shaune Pascal, MD  ferrous sulfate 325 (65 FE) MG tablet Take 325 mg by mouth daily with breakfast.    Yes [provider]  furosemide (LASIX) 40  MG tablet Take 1 tablet (40 mg total) by mouth daily as needed. Patient taking differently: Take 40 mg by mouth daily as needed for fluid.  01/29/18  Yes [name], PA-C  hydrALAZINE (APRESOLINE) 50 MG tablet Take 50 mg by mouth 3 (three) times daily.   Yes [provider]  insulin glargine (LANTUS) 100 UNIT/ML injection Inject 0.25 mLs (25 Units total) into the skin 2 (two) times daily. Patient taking differently: Inject 25 Units into the skin at bedtime.  01/03/17  Yes Carlota Raspberry, Tiffany, PA-C  isosorbide mononitrate (IMDUR) 30 MG 24 hr tablet Take 90 mg by mouth daily.   Yes [provider]  losartan (COZAAR) 100 MG tablet Take 100 mg by mouth daily.  Yes [provider]  nitroGLYCERIN (NITROSTAT) 0.4 MG SL tablet Place 1 tablet (0.4 mg total) under the tongue every 5 (five) minutes x 3 doses as needed for chest pain. 08/13/15  Yes Theora Gianotti, NP  ticagrelor (BRILINTA) 90 MG TABS tablet Take 1 tablet (90 mg total) by mouth 2 (two) times daily. 01/03/17  Yes Carlota Raspberry, Tiffany, PA-C  amLODipine (NORVASC) 5 MG tablet Take 1 tablet (5 mg total) by mouth daily. Patient not taking: Reported on 09/14/2018 04/24/18   Reino Bellis B, NP  carvedilol (COREG) 12.5 MG tablet Take 1.5 tablets (18.75 mg total) by mouth 2 (two) times daily with a meal. Patient not taking: Reported on 09/14/2018 04/23/18   Reino Bellis B, NP  ezetimibe (ZETIA) 10 MG tablet Take 1 tablet (10 mg total) by mouth daily. Patient not taking: Reported on 09/14/2018 04/24/18   Reino Bellis B, NP  hydrALAZINE (APRESOLINE) 100 MG tablet Take 1 tablet (100 mg total) by mouth 3 (three) times daily. Patient not taking: Reported on 09/14/2018 05/02/18   [name], PA-C  isosorbide mononitrate (IMDUR) 120 MG 24 hr tablet Take 1 tablet (120 mg total) by mouth daily. Patient not taking: Reported on 09/14/2018 04/24/18   Cheryln Manly, NP    Family History Family History    Problem Relation Age of Onset  . Cancer Mother        breast  . Diabetes Father     Social History Social History   Tobacco Use  . Smoking status: Former Smoker    Packs/day: 0.25    Years: 40.00    Pack years: 10.00    Types: Cigarettes    Last attempt to quit: 02/27/2012    Years since quitting: 6.5  . Smokeless tobacco: Never Used  Substance Use Topics  . Alcohol use: No  . Drug use: No     Allergies   Plavix [clopidogrel bisulfate]; Codeine; and Tylenol with codeine #3 [acetaminophen-codeine]   Review of Systems Review of Systems  Cardiovascular: Positive for chest pain.  All other systems reviewed and are negative.    Physical Exam Updated Vital Signs BP (!) 162/80 (BP Location: Right Arm)   Pulse 98   Resp 16   SpO2 94%   Physical Exam Vitals signs and nursing note reviewed.  Constitutional:      General: She is in acute distress.     Appearance: She is well-developed. She is ill-appearing.  HENT:     Head: Normocephalic and atraumatic.  Cardiovascular:     Rate and Rhythm: Regular rhythm.     Heart sounds: No murmur.     Comments: Tachycardic Pulmonary:     Effort: Pulmonary effort is normal. No respiratory distress.     Breath sounds: Normal breath sounds.  Abdominal:     Palpations: Abdomen is soft.     Tenderness: There is no abdominal tenderness. There is no guarding or rebound.  Musculoskeletal:        General: No tenderness.     Comments: Right a.k.a.  Skin:    General: Skin is warm and dry.  Neurological:     Mental Status: She is alert and oriented to person, place, and time.  Psychiatric:        Mood and Affect: Mood normal.        Behavior: Behavior normal.      ED Treatments / Results  Labs (all labs ordered are listed, but only abnormal results are displayed) Labs Reviewed  CBC WITH DIFFERENTIAL/PLATELET - Abnormal; Notable for the following components:      Result Value   RBC 3.82 (*)    Hemoglobin 9.5 (*)    HCT  35.3 (*)    MCH 24.9 (*)    MCHC 26.9 (*)    Abs Immature Granulocytes 0.09 (*)    All other components within normal limits  COMPREHENSIVE METABOLIC PANEL - Abnormal; Notable for the following components:   Potassium 5.3 (*)    Chloride 115 (*)    CO2 19 (*)    Glucose, Bld 205 (*)    BUN 42 (*)    Creatinine, Ser 3.77 (*)    Calcium 8.5 (*)    Total Protein 6.3 (*)    Albumin 3.2 (*)    GFR calc non Af Amer 12 (*)    GFR calc Af Amer 14 (*)    All other components within normal limits  LIPASE, BLOOD - Abnormal; Notable for the following components:   Lipase 60 (*)    All other components within normal limits  URINALYSIS, ROUTINE W REFLEX MICROSCOPIC - Abnormal; Notable for the following components:   APPearance CLOUDY (*)    Glucose, UA 50 (*)    Ketones, ur 5 (*)    Protein, ur 100 (*)    Leukocytes, UA TRACE (*)    Bacteria, UA MANY (*)    All other components within normal limits  I-STAT CHEM 8, ED - Abnormal; Notable for the following components:   Potassium 5.4 (*)    Chloride 117 (*)    BUN 49 (*)    Creatinine, Ser 3.80 (*)    Glucose, Bld 197 (*)    Calcium, Ion 1.03 (*)    TCO2 20 (*)    Hemoglobin 10.9 (*)    HCT 32.0 (*)    All other components within normal limits  PROTIME-INR  HEPARIN LEVEL (UNFRACTIONATED)  CBC  I-STAT TROPONIN, ED    EKG EKG Interpretation  Date/Time:  Friday September 14 2018 21:04:17 EST Ventricular Rate:  116 PR Interval:    QRS Duration: 92 QT Interval:  302 QTC Calculation: 420 R Axis:   54 Text Interpretation:  Sinus tachycardia Abnormal R-wave progression, early transition Repol abnrm suggests ischemia, anterolateral Confirmed by Quintella Reichert [phone]) on 09/14/2018 9:13:00 PM   Radiology Dg Chest Port 1 View  Result Date: 09/14/2018 CLINICAL DATA:  Acute onset of left lower chest pain, vomiting and mild shortness of breath. EXAM: PORTABLE CHEST 1 VIEW COMPARISON:  Chest radiograph performed 04/19/2018 FINDINGS:  The lungs are well-aerated and clear. There is no evidence of focal opacification, pleural effusion or pneumothorax. The cardiomediastinal silhouette is within normal limits. No acute osseous abnormalities are seen. IMPRESSION: No acute cardiopulmonary process seen. Electronically Signed   By: Garald Balding M.D.   On: 09/14/2018 22:16    Procedures Procedures (including critical care time) CRITICAL CARE Performed by: Quintella Reichert   Total critical care time: 35 minutes  Critical care time was exclusive of separately billable procedures and treating other patients.  Critical care was necessary to treat or prevent imminent or life-threatening deterioration.  Critical care was time spent personally by me on the following activities: development of treatment plan with patient and/or surrogate as well as nursing, discussions with consultants, evaluation of patient's response to treatment, examination of patient, obtaining history from patient or surrogate, ordering and performing treatments and interventions, ordering and review of laboratory studies, ordering and review of radiographic studies, pulse  oximetry and re-evaluation of patient's condition.  Medications Ordered in ED Medications  nitroGLYCERIN 50 mg in dextrose 5 % 250 mL (0.2 mg/mL) infusion (10 mcg/min Intravenous Rate/Dose Change 09/14/18 2118)  heparin ADULT infusion 100 units/mL (25000 units/269mL sodium chloride 0.45%) (850 Units/hr Intravenous New Bag/Given 09/14/18 2139)  ondansetron (ZOFRAN) injection 4 mg (4 mg Intravenous Given 09/14/18 2114)  fentaNYL (SUBLIMAZE) injection 50 mcg (50 mcg Intravenous Given 09/14/18 2114)  heparin injection 4,000 Units (4,000 Units Intravenous Given 09/14/18 2110)  sodium polystyrene (KAYEXALATE) 15 GM/60ML suspension 30 g (30 g Oral Given 09/14/18 2341)     Initial Impression / Assessment and Plan / ED Course  I have reviewed the triage vital signs and the nursing notes.  Pertinent  labs & imaging results that were available during my care of the patient were reviewed by me and considered in my medical decision making (see chart for details).     Patient with history of CKD, ischemic cardiomyopathy here for evaluation of severe chest pain that began at 6 PM. She is in severe distress on ED arrival. ED EKG without ST elevation, there are ischemic changes that are stable when compared to priors. She was evaluated by cardiology after ED arrival and STEMI was alert canceled. She was treated with heparin, nitroglycerin drip, antiemetics. On repeat assessment following nitroglycerin her pain is resolved and she is feeling improved. Hospitalist consulted for admission for further evaluation and management.  Final Clinical Impressions(s) / ED Diagnoses   Final diagnoses:  Unstable angina [facility])    ED Discharge Orders    None       Quintella Reichert, MD 09/15/18 [phone]

## 2018-09-14 NOTE — ED Notes (Signed)
NTG drip infusing , IV sites intact , Zofran and Fentanyl IV given , Dr. Hassell Done ( cardiologist ) at bedside evaluating pt.

## 2018-09-14 NOTE — H&P (Signed)
History and Physical    Jenna Brown YCX:[id number] DOB: [birth year] DOA: 09/14/2018  Referring MD/NP/PA:   PCP: [name], MD   Patient coming from:  The patient is coming from home.  At baseline, pt is independent for most of ADL.        Chief Complaint: Chest pain  HPI: Jenna Brown is a 67 y.o. female with medical history significant of hypertension, hyperlipidemia, diabetes mellitus, stroke, iron deficiency anemia, CKD- 5, CAD, s/p of right AKA, vertigo, PAD, sCHF with EF 45%, anxiety, who presents with chest pain.  Patient states that her chest pain started at about 6 PM, which is located in the left lower chest, constant, 8 out of 10 in severity, pressure-like, nonradiating.  Patient's nausea vomiting.  Denies abdominal pain or diarrhea.  It is associated with shortness breath, but no cough, fever or chills.  No symptoms of UTI or unilateral weakness.  No tenderness in the calf areas.   ED Course: pt was found to have negative troponin initialy-->increased to 1.26 later, INR 0.98, lipase 60, potassium 5.4, stable renal function, no fever, blood pressure still elevated 192/108, tachypnea, tachycardia, oxygen saturation 93 to 96% on room air.  Chest x-ray negative.  .  Cardiology, Dr. Hassell Done was consulted. Patient was initially placed on stepdown bed for observation, but the repeated trop 1.26-->changd to inpt status  Cardic cath on 04/13/15:  Prox LAD to Mid LAD lesion, 90% stenosed.  Ost Cx to Mid Cx lesion, 95% stenosed.  Ost 2nd Mrg to 2nd Mrg lesion, 80% stenosed.  Mid Cx lesion, 90% stenosed.  Prox RCA lesion, 60% stenosed.  Mid RCA lesion, 50% stenosed.  Review of Systems:   General: no fevers, chills, no body weight gain, has fatigue HEENT: no blurry vision, hearing changes or sore throat Respiratory: Has dyspnea, no coughing, wheezing CV: Has chest pain, no palpitations GI: Has nausea, vomiting, no abdominal pain, diarrhea, constipation GU: no dysuria,  burning on urination, increased urinary frequency, hematuria  Ext: no leg edema Neuro: no unilateral weakness, numbness, or tingling, no vision change or hearing loss Skin: no rash, no skin tear. MSK: No muscle spasm, no deformity, no limitation of range of movement in spin Heme: No easy bruising.  Travel history: No recent long distant travel.  Allergy:  Allergies  Allergen Reactions  . Plavix [Clopidogrel Bisulfate] Itching  . Codeine Other (See Comments)    "makes me feel strange"  . Tylenol With Codeine #3 [Acetaminophen-Codeine] Other (See Comments)    "doesn't make me feel right"    Past Medical History:  Diagnosis Date  . Anemia   . Anxiety    takes Xanax prn  . Arthritis   . CKD (chronic kidney disease), stage IV ([place])   . Coronary artery disease 2016   cath w/ 90% LAD, 95%CFX, 80% OM 2, 60% RCA, not CABG candidate, rx medically  . GERD (gastroesophageal reflux disease)   . H/O hiatal hernia   . Headache(784.0)    when b/p is elevated  . Hemorrhoids   . Hx of AKA (above knee amputation), right ([place])   . Hyperlipidemia    takes Simvastatin daily  . Hypertension    takes Amlodipine/HCTZ/Losartan daily  . Migraines   . Myocardial infarction ([place]) 1990's  . NSTEMI (non-ST elevated myocardial infarction) ([place])   . Peripheral vascular disease ([place])   . PONV (postoperative nausea and vomiting)   . Stroke [facility])    TIA history  . Type II diabetes  mellitus ([place])    on Lantus  . Vertigo    takes Ativert prn    Past Surgical History:  Procedure Laterality Date  . ABDOMINAL AORTAGRAM N/A 02/29/2012   Procedure: ABDOMINAL Maxcine Ham;  Surgeon: [name], MD;  Location: [facility] CATH LAB;  Service: Cardiovascular;  Laterality: N/A;  . AMPUTATION  05/10/2012   Procedure: AMPUTATION ABOVE KNEE;  Surgeon: [name], MD;  Location: [place] OR;  Service: Vascular;  Laterality: Right;   open right groin wound noted  . aortogram  02/2012  . CARDIAC CATHETERIZATION N/A  04/13/2015   Procedure: Right/Left Heart Cath and Coronary Angiography;  Surgeon: [name], MD;  Location: [place] CV LAB;  Service: Cardiovascular;  Laterality: N/A;  . CLEFT PALATE REPAIR     several  . COLONOSCOPY    . DILATION AND CURETTAGE OF UTERUS    . FEMORAL-POPLITEAL BYPASS GRAFT  04/05/2012   Procedure: BYPASS GRAFT FEMORAL-POPLITEAL ARTERY;  Surgeon: [name], MD;  Location: [place];  Service: Vascular;  Laterality: Right;  REVISION  . FEMORAL-TIBIAL BYPASS GRAFT  03/29/2012   Procedure: BYPASS GRAFT FEMORAL-TIBIAL ARTERY;  Surgeon: [name], MD;  Location: [place] OR;  Service: Vascular;  Laterality: Right;  Right femoral to Posterior Tibialis with composite graft of 12mm x 80 cm ringed gortex graft and saphenous vein  ,intraoperative arteriogram  . FEMOROPOPLITEAL THROMBECTOMY / EMBOLECTOMY  05/09/2012  . MYOMECTOMY    . PR VEIN BYPASS GRAFT,AORTO-FEM-POP  05/27/11   Right SFA-Below knee Pop BP  . TEE WITHOUT CARDIOVERSION N/A 01/03/2017   Procedure: TRANSESOPHAGEAL ECHOCARDIOGRAM (TEE);  Surgeon: Skeet Latch, MD;  Location: [facility] ENDOSCOPY;  Service: Cardiovascular;  Laterality: N/A;  . TONSILLECTOMY    . TUBAL LIGATION  ~ 1990    Social History:  reports that she quit smoking about 6 years ago. Her smoking use included cigarettes. She has a 10.00 pack-year smoking history. She has never used smokeless tobacco. She reports that she does not drink alcohol or use drugs.  Family History:  Family History  Problem Relation Age of Onset  . Cancer Mother        breast  . Diabetes Father      Prior to Admission medications   Medication Sig Start Date End Date Taking? Authorizing Provider  acetaminophen (TYLENOL) 500 MG tablet Take 500 mg by mouth every 6 (six) hours as needed for mild pain.   Yes [provider]  ALPRAZolam (XANAX) 0.25 MG tablet Take 1 tablet (0.25 mg total) by mouth 3 (three) times daily as needed for anxiety. Patient taking  differently: Take 0.25 mg by mouth daily as needed for anxiety.  01/03/17  Yes Carlota Raspberry, Tiffany, PA-C  aspirin 81 MG chewable tablet Chew 81 mg by mouth daily.   Yes [provider]  atorvastatin (LIPITOR) 80 MG tablet Take 80 mg by mouth daily.  01/09/18  Yes [provider]  Cholecalciferol (VITAMIN D-3) 25 MCG (1000 UT) CAPS Take 1,000 Units by mouth daily.   Yes [provider]  doxazosin (CARDURA) 2 MG tablet Take 1 tablet (2 mg total) by mouth daily. Patient taking differently: Take 1 mg by mouth at bedtime.  08/01/18  Yes Bensimhon, Shaune Pascal, MD  ferrous sulfate 325 (65 FE) MG tablet Take 325 mg by mouth daily with breakfast.    Yes [provider]  furosemide (LASIX) 40 MG tablet Take 1 tablet (40 mg total) by mouth daily as needed. Patient taking differently: Take  40 mg by mouth daily as needed for fluid.  01/29/18  Yes [name], PA-C  hydrALAZINE (APRESOLINE) 50 MG tablet Take 50 mg by mouth 3 (three) times daily.   Yes [provider]  insulin glargine (LANTUS) 100 UNIT/ML injection Inject 0.25 mLs (25 Units total) into the skin 2 (two) times daily. Patient taking differently: Inject 25 Units into the skin at bedtime.  01/03/17  Yes Carlota Raspberry, Tiffany, PA-C  isosorbide mononitrate (IMDUR) 30 MG 24 hr tablet Take 90 mg by mouth daily.   Yes [provider]  losartan (COZAAR) 100 MG tablet Take 100 mg by mouth daily.   Yes [provider]  nitroGLYCERIN (NITROSTAT) 0.4 MG SL tablet Place 1 tablet (0.4 mg total) under the tongue every 5 (five) minutes x 3 doses as needed for chest pain. 08/13/15  Yes Theora Gianotti, NP  ticagrelor (BRILINTA) 90 MG TABS tablet Take 1 tablet (90 mg total) by mouth 2 (two) times daily. 01/03/17  Yes Carlota Raspberry, Tiffany, PA-C  amLODipine (NORVASC) 5 MG tablet Take 1 tablet (5 mg total) by mouth daily. Patient not taking: Reported on 09/14/2018 04/24/18   Reino Bellis B, NP  carvedilol  (COREG) 12.5 MG tablet Take 1.5 tablets (18.75 mg total) by mouth 2 (two) times daily with a meal. Patient not taking: Reported on 09/14/2018 04/23/18   Reino Bellis B, NP  ezetimibe (ZETIA) 10 MG tablet Take 1 tablet (10 mg total) by mouth daily. Patient not taking: Reported on 09/14/2018 04/24/18   Reino Bellis B, NP  hydrALAZINE (APRESOLINE) 100 MG tablet Take 1 tablet (100 mg total) by mouth 3 (three) times daily. Patient not taking: Reported on 09/14/2018 05/02/18   [name], PA-C  isosorbide mononitrate (IMDUR) 120 MG 24 hr tablet Take 1 tablet (120 mg total) by mouth daily. Patient not taking: Reported on 09/14/2018 04/24/18   Cheryln Manly, NP    Physical Exam: Vitals:   09/15/18 0100 09/15/18 0115 09/15/18 0215 09/15/18 0419  BP: (!) 154/75 (!) 151/90 (!) 145/63 (!) 148/70  Pulse: (!) 104 (!) 111  (!) 105  Resp: 19 17  19   SpO2: 93% 94%  97%   General: Not in acute distress HEENT:       Eyes: PERRL, EOMI, no scleral icterus.       ENT: No discharge from the ears and nose, no pharynx injection, no tonsillar enlargement.        Neck: No JVD, no bruit, no mass felt. Heme: No neck lymph node enlargement. Cardiac: S1/S2, RRR, No murmurs, No gallops or rubs. Respiratory: No rales, wheezing, rhonchi or rubs. GI: Soft, nondistended, nontender, no rebound pain, no organomegaly, BS present. GU: No hematuria Ext: No pitting leg edema bilaterally. 2+DP/PT in left leg. S/p of R AKA. Musculoskeletal: No joint deformities, No joint redness or warmth, no limitation of ROM in spin. Skin: No rashes.  Neuro: Alert, oriented X3, cranial nerves II-XII grossly intact, moves all extremities normally Psych: Patient is not psychotic, no suicidal or hemocidal ideation.  Labs on Admission: I have personally reviewed following labs and imaging studies  CBC: Recent Labs  Lab 09/14/18 2105 09/14/18 2111  WBC 8.8  --   NEUTROABS 5.5  --   HGB 9.5* 10.9*  HCT 35.3* 32.0*    MCV 92.4  --   PLT 285  --    Basic Metabolic Panel: Recent Labs  Lab 09/14/18 2105 09/14/18 2111  NA 142 143  K 5.3* 5.4*  CL 115* 117*  CO2 19*  --   GLUCOSE 205* 197*  BUN 42* 49*  CREATININE 3.77* 3.80*  CALCIUM 8.5*  --    GFR: CrCl cannot be calculated (Unknown ideal weight.). Liver Function Tests: Recent Labs  Lab 09/14/18 2105  AST 32  ALT 38  ALKPHOS 83  BILITOT 0.8  PROT 6.3*  ALBUMIN 3.2*   Recent Labs  Lab 09/14/18 2153  LIPASE 60*   No results for input(s): AMMONIA in the last 168 hours. Coagulation Profile: Recent Labs  Lab 09/14/18 2105  INR 0.98   Cardiac Enzymes: Recent Labs  Lab 09/15/18 0115  TROPONINI 1.26*   BNP (last 3 results) No results for input(s): PROBNP in the last 8760 hours. HbA1C: No results for input(s): HGBA1C in the last 72 hours. CBG: No results for input(s): GLUCAP in the last 168 hours. Lipid Profile: No results for input(s): CHOL, HDL, LDLCALC, TRIG, CHOLHDL, LDLDIRECT in the last 72 hours. Thyroid Function Tests: No results for input(s): TSH, T4TOTAL, FREET4, T3FREE, THYROIDAB in the last 72 hours. Anemia Panel: No results for input(s): VITAMINB12, FOLATE, FERRITIN, TIBC, IRON, RETICCTPCT in the last 72 hours. Urine analysis:    Component Value Date/Time   COLORURINE YELLOW 09/14/2018 2330   APPEARANCEUR CLOUDY (A) 09/14/2018 2330   LABSPEC 1.014 09/14/2018 2330   PHURINE 7.0 09/14/2018 2330   GLUCOSEU 50 (A) 09/14/2018 2330   HGBUR NEGATIVE 09/14/2018 2330   BILIRUBINUR NEGATIVE 09/14/2018 2330   KETONESUR 5 (A) 09/14/2018 2330   PROTEINUR 100 (A) 09/14/2018 2330   UROBILINOGEN 0.2 04/10/2015 1248   NITRITE NEGATIVE 09/14/2018 2330   LEUKOCYTESUR TRACE (A) 09/14/2018 2330   Sepsis Labs: @LABRCNTIP (procalcitonin:4,lacticidven:4) )No results found for this or any previous visit (from the past 240 hour(s)).   Radiological Exams on Admission: Dg Chest Port 1 View  Result Date: 09/14/2018 CLINICAL  DATA:  Acute onset of left lower chest pain, vomiting and mild shortness of breath. EXAM: PORTABLE CHEST 1 VIEW COMPARISON:  Chest radiograph performed 04/19/2018 FINDINGS: The lungs are well-aerated and clear. There is no evidence of focal opacification, pleural effusion or pneumothorax. The cardiomediastinal silhouette is within normal limits. No acute osseous abnormalities are seen. IMPRESSION: No acute cardiopulmonary process seen. Electronically Signed   By: Garald Balding M.D.   On: 09/14/2018 22:16     EKG: Independently reviewed.  Sinus rhythm, QTC 420, diffuse T wave inversion, ST elevation only in V1, early R wave progression  Assessment/Plan Principal Problem:   Non-ST elevation (NSTEMI) myocardial infarction [facility]) Active Problems:   Stroke (HCC)   Anxiety   Anemia   Essential hypertension   Hyperlipidemia LDL goal <70   Hyperkalemia   Chronic systolic heart failure (HCC)   Chest pain   CAD in native artery   CKD (chronic kidney disease) stage 5, GFR less than 15 ml/min (HCC)   Coronary artery disease   Hypertensive urgency   NSTEMI (non-ST elevated myocardial infarction) (HCC)   Chest pain, NSTEMi and hx of CAD: Patient has known three-vessel disease, now presents with persistent chest pain, initially concerning for possible unstable angina. Later on trop 1.26-->NSTEMI.  Patient blood pressure is significantly elevated at 192/108, which may have contributed partially to chest pain by causing demand ischemia.  Initial troponin negative.  Cardiology was consulted, Dr. Hassell Done recommended aggressive blood pressure control and increased dose of Imdur drom 90-->120 mg. Pt has CKD-V, poor candidate for cardiac cath.  - Patient was initially placed on stepdown bed for  observation, but the repeated trop is 1.26-->changd to inpt status - cycle CE q6 x3 and repeat EKG in the am  - on Nitroglycerin gtt - increased imdur to 120 mg daily per Dr. Hassell Done - prn Morphine, and aspirin,  lipitor and brinlinta - Risk factor stratification: will check FLP and A1C , UDS - 2d echo  Hx of stroke ([place]): -ASA, lipitor  Anxiety: -prn ativan  HTN and  Hypertensive urgency -Continue home medications: Hydralazine, Cozaar, amlodipine, Cardura -on NGT gtt now  Hyperlipidemia LDL goal <70: -lipitor  Chronic systolic heart failure ([place]): 2D echo on 08/01/2018 showed EF of 45-50%.  No leg edema.  CHF seems to be compensated. -Continue home dose Lasix-as needed -Check BMP  CKD (chronic kidney disease) stage 5, GFR less than 15 ml/min ([place]): Stable.  Recent creatinine 4.2 on 04/23/2018.  Her creatinine is 3.80, BUN 49. -Follow-up renal function by BMP  Hypokalemia: Mild.  Potassium 5.4. -Kayexalate 30 g    Inpatient status:  # Patient requires inpatient status due to high intensity of service, high risk for further deterioration and high frequency of surveillance required.  I certify that at the point of admission it is my clinical judgment that the patient will require inpatient hospital care spanning beyond 2 midnights from the point of admission.  . This patient has multiple chronic comorbidities including hypertension, hyperlipidemia, diabetes mellitus, stroke, iron deficiency anemia, CKD- 5, CAD, s/p of right AKA, vertigo, PAD, sCHF with EF 45%, anxiety . Now patient has presenting symptoms include chest pain . The initial radiographic and laboratory data are worrisome because of elevated troponin, hyperkalemia, . Current medical needs: please see my assessment and plan . Predictability of an adverse outcome (risk): Patient has multiple comorbidities, now presents with non-STEMI, with elevated troponin up to 1.26, also has CKD-5, systolic CHF, at high risk for deteriorating.  Patient will need to be treated in hospital for at least 2 days.      DVT ppx: on IV Heparin  Code Status: Full code Family Communication: None at bed side.  Disposition Plan:  Anticipate  discharge back to previous home environment Consults called:  Card, Dr. Hassell Done Admission status:  SDU/inpation       Date of Service 09/15/2018    Ivor Costa [facility] Pager [phone]  If 7PM-7AM, please contact night-coverage [email or web address] Password [facility] 09/15/2018, 5:35 AM

## 2018-09-14 NOTE — Consult Note (Signed)
Cardiology Consultation   Patient ID: Jenna Brown; [id number]; 05-Jul-1951   Admit date: 09/14/2018 Date of Consult: 09/14/2018  Referring Provider:  Quintella Reichert, MD  Primary Care Provider: [name], MD Cardiologist: [place]  Reason for Consultation: CP, possible STEMI  History of Present Illness: Jenna Brown is a 67 y.o. female who is being seen today for the evaluation of CP/possible STEMI at the request of EMS/ED after Code STEMI called by EMS en route to [facility]. Pt is well-known to the [facility] Cardiology service, w/ h/o severe CAD and not a candidate for either CABG or PCI, being managed medically. Pt describes sudden onset CP at rest earlier this evening while watching TV. Pain was a L-sided "pressure" discomfort in her chest, associated with nausea, and vomiting as well as SOB. Upon arrival at ED, pt's BP very high; when I saw her in the ED, SBP was nearly 220 and DBP was >100. The pain had been going on for about an hour or so by the time I saw pt; she says nothing at home made it better. She stated that when she gets this pain the only thing that makes it better is coming into the hospital.   Past Medical History:  Diagnosis Date  . Anemia   . Anxiety    takes Xanax prn  . Arthritis   . CKD (chronic kidney disease), stage IV ([place])   . Coronary artery disease 2016   cath w/ 90% LAD, 95%CFX, 80% OM 2, 60% RCA, not CABG candidate, rx medically  . GERD (gastroesophageal reflux disease)   . H/O hiatal hernia   . Headache(784.0)    when b/p is elevated  . Hemorrhoids   . Hx of AKA (above knee amputation), right ([place])   . Hyperlipidemia    takes Simvastatin daily  . Hypertension    takes Amlodipine/HCTZ/Losartan daily  . Migraines   . Myocardial infarction ([place]) 1990's  . NSTEMI (non-ST elevated myocardial infarction) ([place])   . Peripheral vascular disease ([place])   . PONV (postoperative nausea and vomiting)   . Stroke [facility])    TIA history  . Type II diabetes  mellitus (HCC)    on Lantus  . Vertigo    takes Ativert prn    Past Surgical History:  Procedure Laterality Date  . ABDOMINAL AORTAGRAM N/A 02/29/2012   Procedure: ABDOMINAL Maxcine [place];  Surgeon: [name], MD;  Location: [facility] CATH LAB;  Service: Cardiovascular;  Laterality: N/A;  . AMPUTATION  05/10/2012   Procedure: AMPUTATION ABOVE KNEE;  Surgeon: [name], MD;  Location: [employer] OR;  Service: Vascular;  Laterality: Right;   open right groin wound noted  . aortogram  02/2012  . CARDIAC CATHETERIZATION N/A 04/13/2015   Procedure: Right/Left Heart Cath and Coronary Angiography;  Surgeon: [name], MD;  Location: [place] CV LAB;  Service: Cardiovascular;  Laterality: N/A;  . CLEFT PALATE REPAIR     several  . COLONOSCOPY    . DILATION AND CURETTAGE OF UTERUS    . FEMORAL-POPLITEAL BYPASS GRAFT  04/05/2012   Procedure: BYPASS GRAFT FEMORAL-POPLITEAL ARTERY;  Surgeon: [name], MD;  Location: [place];  Service: Vascular;  Laterality: Right;  REVISION  . FEMORAL-TIBIAL BYPASS GRAFT  03/29/2012   Procedure: BYPASS GRAFT FEMORAL-TIBIAL ARTERY;  Surgeon: [name], MD;  Location: [place] OR;  Service: Vascular;  Laterality: Right;  Right femoral to Posterior Tibialis with composite graft of 62mm x 80 cm ringed gortex graft and saphenous  vein  ,intraoperative arteriogram  . FEMOROPOPLITEAL THROMBECTOMY / EMBOLECTOMY  05/09/2012  . MYOMECTOMY    . PR VEIN BYPASS GRAFT,AORTO-FEM-POP  05/27/11   Right SFA-Below knee Pop BP  . TEE WITHOUT CARDIOVERSION N/A 01/03/2017   Procedure: TRANSESOPHAGEAL ECHOCARDIOGRAM (TEE);  Surgeon: Skeet Latch, MD;  Location: [facility] ENDOSCOPY;  Service: Cardiovascular;  Laterality: N/A;  . TONSILLECTOMY    . TUBAL LIGATION  ~ 1990      Current Medications: Current Meds  Medication Sig  . acetaminophen (TYLENOL) 500 MG tablet Take 500 mg by mouth every 6 (six) hours as needed for mild pain.  Marland Kitchen aspirin 81 MG chewable tablet Chew 81 mg by mouth daily.   Marland Kitchen atorvastatin (LIPITOR) 80 MG tablet Take 80 mg by mouth daily.   . Cholecalciferol (VITAMIN D-3) 25 MCG (1000 UT) CAPS Take 1,000 Units by mouth daily.  . ferrous sulfate 325 (65 FE) MG tablet Take 325 mg by mouth daily with breakfast.   . hydrALAZINE (APRESOLINE) 50 MG tablet Take 50 mg by mouth 3 (three) times daily.  . isosorbide mononitrate (IMDUR) 30 MG 24 hr tablet Take 90 mg by mouth daily.  Marland Kitchen losartan (COZAAR) 100 MG tablet Take 100 mg by mouth daily.  . nitroGLYCERIN (NITROSTAT) 0.4 MG SL tablet Place 1 tablet (0.4 mg total) under the tongue every 5 (five) minutes x 3 doses as needed for chest pain.  . ticagrelor (BRILINTA) 90 MG TABS tablet Take 1 tablet (90 mg total) by mouth 2 (two) times daily.      Infused Medications: . heparin 850 Units/hr (09/14/18 2139)  . nitroGLYCERIN 10 mcg/min (09/14/18 2118)    PRN Medications:    Allergies:    Allergies  Allergen Reactions  . Plavix [Clopidogrel Bisulfate] Itching  . Codeine Other (See Comments)    "makes me feel strange"  . Tylenol With Codeine #3 [Acetaminophen-Codeine] Other (See Comments)    "doesn't make me feel right"    Social History:   The patient  reports that she quit smoking about 6 years ago. Her smoking use included cigarettes. She has a 10.00 pack-year smoking history. She has never used smokeless tobacco. She reports that she does not drink alcohol or use drugs.    Family History:   The patient's family history includes Cancer in her mother; Diabetes in her father.   ROS:  Please see the history of present illness.  All other ROS reviewed and negative.     Vital Signs: Blood pressure (!) 173/85, pulse (!) 103, resp. rate 19, SpO2 97 %.   PHYSICAL EXAM: General:  Well nourished, well developed, in mild distress HEENT: normal Lymph: no adenopathy Neck: no JVD Endocrine:  No thryomegaly Vascular: No carotid bruits; s/p R AKA. LLE pulse not palpable Cardiac:  normal S1, S2; RRR; no murmur  Lungs:   clear to auscultation bilaterally, no wheezing, rhonchi or rales  Abd: soft, nontender, no hepatomegaly  Ext: 1+ LLE edema Musculoskeletal:  No deformities, BUE and BLE strength normal and equal Skin: warm and dry  Neuro:  CNs 2-12 intact, no focal abnormalities noted Psych:  Normal affect   EKG:  ST with TWI in inferior leads, ~61mm ST elevation in AVR, <9mm STE in anterior leads (no significant change from prior tracings)  Labs: No results for input(s): CKTOTAL, CKMB, TROPONINI in the last 72 hours. Recent Labs    09/14/18 2109  TROPIPOC 0.07    Lab Results  Component Value Date   WBC 8.8 09/14/2018  HGB 10.9 (L) 09/14/2018   HCT 32.0 (L) 09/14/2018   MCV 92.4 09/14/2018   PLT 285 09/14/2018   Recent Labs  Lab 09/14/18 2105 09/14/18 2111  NA 142 143  K 5.3* 5.4*  CL 115* 117*  CO2 19*  --   BUN 42* 49*  CREATININE 3.77* 3.80*  CALCIUM 8.5*  --   PROT 6.3*  --   BILITOT 0.8  --   ALKPHOS 83  --   ALT 38  --   AST 32  --   GLUCOSE 205* 197*   Lab Results  Component Value Date   CHOL 174 04/20/2018   HDL 39 (L) 04/20/2018   LDLCALC 119 (H) 04/20/2018   TRIG 78 04/20/2018   Lab Results  Component Value Date   DDIMER 0.85 (H) 04/10/2015    Radiology/Studies:  No results found.   08-01-18 TTE Left ventricle: The cavity size was normal. There was mild focal   basal hypertrophy of the septum. Systolic function was mildly   reduced. The estimated ejection fraction was in the range of 45%   to 50%. Hypokinesis of the apicalinferolateral, inferior,   inferoseptal, and apical myocardium. - Aortic valve: Trileaflet; mildly thickened, mildly calcified   leaflets. Left coronary cusp mobility was moderately restricted. - Mitral valve: Calcified annulus. Mildly thickened leaflets .   There was trivial regurgitation. - Right ventricle: The cavity size was mildly dilated. Wall   thickness was normal. Systolic function was low normal. - Atrial septum: No defect or  patent foramen ovale was identified. - Tricuspid valve: There was no significant regurgitation. - Pulmonic valve: There was trivial regurgitation.  04-13-15 LHC  Prox LAD to Mid LAD lesion, 90% stenosed.  Ost Cx to Mid Cx lesion, 95% stenosed.  Ost 2nd Mrg to 2nd Mrg lesion, 80% stenosed.  Mid Cx lesion, 90% stenosed.  Prox RCA lesion, 60% stenosed.  Mid RCA lesion, 50% stenosed.  ASSESSMENT AND PLAN:  1. CP/CAD: may be due to demand, related to uncontrolled HTN. STEMI cancelled; will cont to medically manage pt's CAD. Currently she is on imdur 90mg  daily; will increase that to 120mg  daily. Consider adding ranexa on OP basis if her sx remain uncontrolled on nitrates alone. Recommend aggressive BP control  2. HTN: BP is high, possibly made more difficult to control/refractory to medical tx due to ESRD. She is preparing for hemodialysis which will probably help w/ BP management. Would recommend restarting home regimen and making adjustments as needed  3. ESRD: as above; mgmt as per nephrology  4. Dyslipidemia/DM2: restart home regimen, adjust PRN  Thank you for the opportunity to participate in the care of this patient. Will follow. Please call w/ questions.   Signed, Rudean Curt, MD, [facility] 09/14/2018 10:18 PM

## 2018-09-14 NOTE — Progress Notes (Signed)
ANTICOAGULATION CONSULT NOTE - Initial Consult  Pharmacy Consult for Heparin Indication: NSTEMI  Allergies  Allergen Reactions  . Plavix [Clopidogrel Bisulfate] Itching  . Codeine Other (See Comments)    "makes me feel strange"  . Tylenol With Codeine #3 [Acetaminophen-Codeine] Other (See Comments)    "doesn't make me feel right"    Patient Measurements:    Ht: 63.5 in Wt: 73 kg IBW: 54 kg Heparin Dosing Weight: 70 kg  Vital Signs:    Labs: Recent Labs    09/14/18 2111  HGB 10.9*  HCT 32.0*  CREATININE 3.80*    CrCl cannot be calculated (Unknown ideal weight.).   Medical History: Past Medical History:  Diagnosis Date  . Anemia   . Anxiety    takes Xanax prn  . Arthritis   . CKD (chronic kidney disease), stage IV ([place])   . Coronary artery disease 2016   cath w/ 90% LAD, 95%CFX, 80% OM 2, 60% RCA, not CABG candidate, rx medically  . GERD (gastroesophageal reflux disease)   . H/O hiatal hernia   . Headache(784.0)    when b/p is elevated  . Hemorrhoids   . Hx of AKA (above knee amputation), right ([place])   . Hyperlipidemia    takes Simvastatin daily  . Hypertension    takes Amlodipine/HCTZ/Losartan daily  . Migraines   . Myocardial infarction ([place]) 1990's  . NSTEMI (non-ST elevated myocardial infarction) ([place])   . Peripheral vascular disease ([place])   . PONV (postoperative nausea and vomiting)   . Stroke [facility])    TIA history  . Type II diabetes mellitus (HCC)    on Lantus  . Vertigo    takes Ativert prn    Medications:  Awaiting med rec  Assessment: 67 y.o. F presents with CP. To begin heparin for NSTEMI. CBC ok on admission. No AC PTA.  Goal of Therapy:  Heparin level 0.3-0.7 units/ml Monitor platelets by anticoagulation protocol: Yes   Plan:  Heparin IV bolus 4000 units - already given Heparin gtt at 850 units/hr Will f/u heparin level in 8 hours Daily heparin level and CBC   Sherlon Handing, PharmD, BCPS Clinical pharmacist  **Pharmacist  phone directory can now be found on [email or web address] ([email or web address]).  Listed under [place]. 09/14/2018,9:25 PM

## 2018-09-15 ENCOUNTER — Other Ambulatory Visit: Payer: Self-pay

## 2018-09-15 DIAGNOSIS — I5022 Chronic systolic (congestive) heart failure: Secondary | ICD-10-CM | POA: Diagnosis not present

## 2018-09-15 DIAGNOSIS — Z833 Family history of diabetes mellitus: Secondary | ICD-10-CM | POA: Diagnosis not present

## 2018-09-15 DIAGNOSIS — Z89611 Acquired absence of right leg above knee: Secondary | ICD-10-CM | POA: Diagnosis not present

## 2018-09-15 DIAGNOSIS — Z7982 Long term (current) use of aspirin: Secondary | ICD-10-CM | POA: Diagnosis not present

## 2018-09-15 DIAGNOSIS — Z8673 Personal history of transient ischemic attack (TIA), and cerebral infarction without residual deficits: Secondary | ICD-10-CM | POA: Diagnosis not present

## 2018-09-15 DIAGNOSIS — I252 Old myocardial infarction: Secondary | ICD-10-CM | POA: Diagnosis not present

## 2018-09-15 DIAGNOSIS — E1122 Type 2 diabetes mellitus with diabetic chronic kidney disease: Secondary | ICD-10-CM | POA: Diagnosis present

## 2018-09-15 DIAGNOSIS — D509 Iron deficiency anemia, unspecified: Secondary | ICD-10-CM | POA: Diagnosis present

## 2018-09-15 DIAGNOSIS — N185 Chronic kidney disease, stage 5: Secondary | ICD-10-CM | POA: Diagnosis not present

## 2018-09-15 DIAGNOSIS — I10 Essential (primary) hypertension: Secondary | ICD-10-CM | POA: Diagnosis not present

## 2018-09-15 DIAGNOSIS — R079 Chest pain, unspecified: Secondary | ICD-10-CM | POA: Diagnosis not present

## 2018-09-15 DIAGNOSIS — E876 Hypokalemia: Secondary | ICD-10-CM | POA: Diagnosis present

## 2018-09-15 DIAGNOSIS — D631 Anemia in chronic kidney disease: Secondary | ICD-10-CM | POA: Diagnosis present

## 2018-09-15 DIAGNOSIS — I2510 Atherosclerotic heart disease of native coronary artery without angina pectoris: Secondary | ICD-10-CM | POA: Diagnosis not present

## 2018-09-15 DIAGNOSIS — I200 Unstable angina: Secondary | ICD-10-CM | POA: Diagnosis not present

## 2018-09-15 DIAGNOSIS — I214 Non-ST elevation (NSTEMI) myocardial infarction: Principal | ICD-10-CM

## 2018-09-15 DIAGNOSIS — E1151 Type 2 diabetes mellitus with diabetic peripheral angiopathy without gangrene: Secondary | ICD-10-CM | POA: Diagnosis present

## 2018-09-15 DIAGNOSIS — I25110 Atherosclerotic heart disease of native coronary artery with unstable angina pectoris: Secondary | ICD-10-CM | POA: Diagnosis not present

## 2018-09-15 DIAGNOSIS — D508 Other iron deficiency anemias: Secondary | ICD-10-CM | POA: Diagnosis not present

## 2018-09-15 DIAGNOSIS — I132 Hypertensive heart and chronic kidney disease with heart failure and with stage 5 chronic kidney disease, or end stage renal disease: Secondary | ICD-10-CM | POA: Diagnosis present

## 2018-09-15 DIAGNOSIS — Z87891 Personal history of nicotine dependence: Secondary | ICD-10-CM | POA: Diagnosis not present

## 2018-09-15 DIAGNOSIS — Z7902 Long term (current) use of antithrombotics/antiplatelets: Secondary | ICD-10-CM | POA: Diagnosis not present

## 2018-09-15 DIAGNOSIS — I160 Hypertensive urgency: Secondary | ICD-10-CM

## 2018-09-15 DIAGNOSIS — I639 Cerebral infarction, unspecified: Secondary | ICD-10-CM | POA: Diagnosis not present

## 2018-09-15 DIAGNOSIS — E785 Hyperlipidemia, unspecified: Secondary | ICD-10-CM | POA: Diagnosis not present

## 2018-09-15 DIAGNOSIS — F419 Anxiety disorder, unspecified: Secondary | ICD-10-CM | POA: Diagnosis not present

## 2018-09-15 DIAGNOSIS — Z79899 Other long term (current) drug therapy: Secondary | ICD-10-CM | POA: Diagnosis not present

## 2018-09-15 DIAGNOSIS — E875 Hyperkalemia: Secondary | ICD-10-CM | POA: Diagnosis not present

## 2018-09-15 LAB — LIPID PANEL
Cholesterol: 162 mg/dL (ref 0–200)
HDL: 45 mg/dL (ref >40)
LDL Cholesterol: 107 mg/dL — ABNORMAL HIGH (ref 0–99)
Total CHOL/HDL Ratio: 3.6 RATIO
Triglycerides: 51 mg/dL (ref <150)
VLDL: 10 mg/dL (ref 0–40)

## 2018-09-15 LAB — RAPID URINE DRUG SCREEN, HOSP PERFORMED
Amphetamines: NOT DETECTED
Barbiturates: NOT DETECTED
Benzodiazepines: NOT DETECTED
Cocaine: NOT DETECTED
OPIATES: NOT DETECTED
TETRAHYDROCANNABINOL: NOT DETECTED

## 2018-09-15 LAB — TROPONIN I
Troponin I: 1.26 ng/mL (ref <0.03)
Troponin I: 3.79 ng/mL (ref <0.03)
Troponin I: 3.34 ng/mL (ref <0.03)

## 2018-09-15 LAB — HEMOGLOBIN A1C
Hgb A1c MFr Bld: 5.9 % — ABNORMAL HIGH (ref 4.8–5.6)
Mean Plasma Glucose: 122.63 mg/dL

## 2018-09-15 LAB — CBC
HCT: 28.7 % — ABNORMAL LOW (ref 36.0–46.0)
Hemoglobin: 8.1 g/dL — ABNORMAL LOW (ref 12.0–15.0)
MCH: 25 pg — ABNORMAL LOW (ref 26.0–34.0)
MCHC: 28.2 g/dL — ABNORMAL LOW (ref 30.0–36.0)
MCV: 88.6 fL (ref 80.0–100.0)
Platelets: 258 10*3/uL (ref 150–400)
RBC: 3.24 MIL/uL — AB (ref 3.87–5.11)
RDW: 14.8 % (ref 11.5–15.5)
WBC: 11.4 10*3/uL — ABNORMAL HIGH (ref 4.0–10.5)
nRBC: 0.2 % (ref 0.0–0.2)

## 2018-09-15 LAB — GLUCOSE, CAPILLARY
GLUCOSE-CAPILLARY: 128 mg/dL — AB (ref 70–99)
Glucose-Capillary: 150 mg/dL — ABNORMAL HIGH (ref 70–99)
Glucose-Capillary: 93 mg/dL (ref 70–99)
Glucose-Capillary: 117 mg/dL — ABNORMAL HIGH (ref 70–99)

## 2018-09-15 LAB — HEPARIN LEVEL (UNFRACTIONATED)
Heparin Unfractionated: 0.6 IU/mL (ref 0.30–0.70)
Heparin Unfractionated: 0.39 IU/mL (ref 0.30–0.70)

## 2018-09-15 LAB — BRAIN NATRIURETIC PEPTIDE: B Natriuretic Peptide: 131 pg/mL — ABNORMAL HIGH (ref 0.0–100.0)

## 2018-09-15 MED ORDER — ONDANSETRON HCL 4 MG/2ML IJ SOLN: 4.0000 mg | Freq: Four times a day (QID) | INTRAMUSCULAR | Status: DC | PRN

## 2018-09-15 MED ORDER — PNEUMOCOCCAL VAC POLYVALENT 25 MCG/0.5ML IJ INJ: 0.5000 mL | INJECTION | INTRAMUSCULAR | Status: DC

## 2018-09-15 MED ORDER — ASPIRIN 81 MG PO CHEW
81.0000 mg | CHEWABLE_TABLET | Freq: Every day | ORAL | Status: DC
  Administered 2018-09-15 – 2018-09-16 (×2): 81 mg via ORAL
  Filled 2018-09-15 (×2): qty 1

## 2018-09-15 MED ORDER — TICAGRELOR 90 MG PO TABS
90.0000 mg | ORAL_TABLET | Freq: Two times a day (BID) | ORAL | Status: DC
  Administered 2018-09-16: 90 mg via ORAL
  Filled 2018-09-15: qty 1

## 2018-09-15 MED ORDER — HYDRALAZINE HCL 25 MG PO TABS
50.0000 mg | ORAL_TABLET | Freq: Three times a day (TID) | ORAL | Status: DC
  Administered 2018-09-15 (×2): 50 mg via ORAL
  Filled 2018-09-15 (×2): qty 2

## 2018-09-15 MED ORDER — CARVEDILOL 3.125 MG PO TABS
3.1250 mg | ORAL_TABLET | Freq: Two times a day (BID) | ORAL | Status: DC
  Administered 2018-09-15: 3.125 mg via ORAL
  Filled 2018-09-15: qty 1

## 2018-09-15 MED ORDER — VITAMIN D 25 MCG (1000 UNIT) PO TABS
1000.0000 [IU] | ORAL_TABLET | Freq: Every day | ORAL | Status: DC
  Administered 2018-09-15 – 2018-09-16 (×2): 1000 [IU] via ORAL
  Filled 2018-09-15 (×2): qty 1

## 2018-09-15 MED ORDER — ATORVASTATIN CALCIUM 80 MG PO TABS
80.0000 mg | ORAL_TABLET | Freq: Every day | ORAL | Status: DC
  Administered 2018-09-15 – 2018-09-16 (×2): 80 mg via ORAL
  Filled 2018-09-15 (×2): qty 1

## 2018-09-15 MED ORDER — LOSARTAN POTASSIUM 50 MG PO TABS
100.0000 mg | ORAL_TABLET | Freq: Every day | ORAL | Status: DC
  Administered 2018-09-15 – 2018-09-16 (×2): 100 mg via ORAL
  Filled 2018-09-15 (×2): qty 2

## 2018-09-15 MED ORDER — INSULIN ASPART 100 UNIT/ML ~~LOC~~ SOLN
0.0000 [IU] | Freq: Three times a day (TID) | SUBCUTANEOUS | Status: DC
  Administered 2018-09-15 (×2): 1 [IU] via SUBCUTANEOUS

## 2018-09-15 MED ORDER — FERROUS SULFATE 325 (65 FE) MG PO TABS
325.0000 mg | ORAL_TABLET | Freq: Every day | ORAL | Status: DC
  Administered 2018-09-15 – 2018-09-16 (×2): 325 mg via ORAL
  Filled 2018-09-15 (×2): qty 1

## 2018-09-15 MED ORDER — ZOLPIDEM TARTRATE 5 MG PO TABS: 5.0000 mg | ORAL_TABLET | Freq: Every evening | ORAL | Status: DC | PRN

## 2018-09-15 MED ORDER — ALPRAZOLAM 0.25 MG PO TABS: 0.2500 mg | ORAL_TABLET | Freq: Every day | ORAL | Status: DC | PRN

## 2018-09-15 MED ORDER — CARVEDILOL 12.5 MG PO TABS
12.5000 mg | ORAL_TABLET | Freq: Two times a day (BID) | ORAL | Status: DC
  Administered 2018-09-15: 12.5 mg via ORAL
  Filled 2018-09-15: qty 1

## 2018-09-15 MED ORDER — ISOSORBIDE MONONITRATE ER 60 MG PO TB24
120.0000 mg | ORAL_TABLET | Freq: Every day | ORAL | Status: DC
  Administered 2018-09-15 – 2018-09-16 (×2): 120 mg via ORAL
  Filled 2018-09-15 (×2): qty 2

## 2018-09-15 MED ORDER — INSULIN GLARGINE 100 UNIT/ML ~~LOC~~ SOLN
18.0000 [IU] | Freq: Two times a day (BID) | SUBCUTANEOUS | Status: DC
  Administered 2018-09-15 – 2018-09-16 (×3): 18 [IU] via SUBCUTANEOUS
  Filled 2018-09-15 (×5): qty 0.18

## 2018-09-15 MED ORDER — ACETAMINOPHEN 325 MG PO TABS
650.0000 mg | ORAL_TABLET | ORAL | Status: DC | PRN
  Administered 2018-09-15: 650 mg via ORAL
  Filled 2018-09-15: qty 2

## 2018-09-15 MED ORDER — NITROGLYCERIN 0.4 MG SL SUBL: 0.4000 mg | SUBLINGUAL_TABLET | SUBLINGUAL | Status: DC | PRN

## 2018-09-15 MED ORDER — AMLODIPINE BESYLATE 5 MG PO TABS
5.0000 mg | ORAL_TABLET | Freq: Every day | ORAL | Status: DC
  Administered 2018-09-15 – 2018-09-16 (×2): 5 mg via ORAL
  Filled 2018-09-15 (×2): qty 1

## 2018-09-15 MED ORDER — FUROSEMIDE 40 MG PO TABS: 40.0000 mg | ORAL_TABLET | Freq: Every day | ORAL | Status: DC | PRN

## 2018-09-15 MED ORDER — MORPHINE SULFATE (PF) 2 MG/ML IV SOLN: 2.0000 mg | INTRAVENOUS | Status: DC | PRN

## 2018-09-15 MED ORDER — DOXAZOSIN MESYLATE 1 MG PO TABS
1.0000 mg | ORAL_TABLET | Freq: Every day | ORAL | Status: DC
  Administered 2018-09-15: 1 mg via ORAL
  Filled 2018-09-15 (×2): qty 1

## 2018-09-15 MED ORDER — SODIUM CHLORIDE 0.9 % IV SOLN
1.0000 g | Freq: Once | INTRAVENOUS | Status: AC
  Administered 2018-09-15: 1 g via INTRAVENOUS
  Filled 2018-09-15: qty 10

## 2018-09-15 MED ORDER — INFLUENZA VAC SPLIT HIGH-DOSE 0.5 ML IM SUSY
0.5000 mL | PREFILLED_SYRINGE | INTRAMUSCULAR | Status: DC
  Filled 2018-09-15: qty 0.5

## 2018-09-15 MED ORDER — HYDRALAZINE HCL 50 MG PO TABS: 100.0000 mg | ORAL_TABLET | Freq: Three times a day (TID) | ORAL | Status: DC

## 2018-09-15 MED ORDER — HYDRALAZINE HCL 50 MG PO TABS
50.0000 mg | ORAL_TABLET | Freq: Three times a day (TID) | ORAL | Status: DC
  Administered 2018-09-15 – 2018-09-16 (×3): 50 mg via ORAL
  Filled 2018-09-15 (×3): qty 1

## 2018-09-15 NOTE — Progress Notes (Signed)
[place] for Heparin Indication: NSTEMI  Allergies  Allergen Reactions  . Plavix [Clopidogrel Bisulfate] Itching  . Codeine Other (See Comments)    "makes me feel strange"  . Tylenol With Codeine #3 [Acetaminophen-Codeine] Other (See Comments)    "doesn't make me feel right"    Patient Measurements: Weight: 158 lb 14.4 oz (72.1 kg)  Ht: 63.5 in Wt: 73 kg IBW: 54 kg Heparin Dosing Weight: 70 kg  Vital Signs: Temp: 98 F (36.7 C) (12/28 0601) Temp Source: Oral (12/28 0601) BP: 141/74 (12/28 1134) Pulse Rate: 101 (12/28 1147)  Labs: Recent Labs    09/14/18 2105 09/14/18 2111 09/15/18 0115 09/15/18 0522 09/15/18 1147  HGB 9.5* 10.9*  --  8.1*  --   HCT 35.3* 32.0*  --  28.7*  --   PLT 285  --   --  258  --   LABPROT 12.9  --   --   --   --   INR 0.98  --   --   --   --   HEPARINUNFRC  --   --   --  0.60 0.39  CREATININE 3.77* 3.80*  --   --   --   TROPONINI  --   --  1.26* 3.79*  --     Estimated Creatinine Clearance: 14.2 mL/min (A) (by C-G formula based on SCr of 3.8 mg/dL (H)).   Medical History: Past Medical History:  Diagnosis Date  . Anemia   . Anxiety    takes Xanax prn  . Arthritis   . CKD (chronic kidney disease), stage IV ([place])   . Coronary artery disease 2016   cath w/ 90% LAD, 95%CFX, 80% OM 2, 60% RCA, not CABG candidate, rx medically  . GERD (gastroesophageal reflux disease)   . H/O hiatal hernia   . Headache(784.0)    when b/p is elevated  . Hemorrhoids   . Hx of AKA (above knee amputation), right ([place])   . Hyperlipidemia    takes Simvastatin daily  . Hypertension    takes Amlodipine/HCTZ/Losartan daily  . Migraines   . Myocardial infarction ([place]) 1990's  . NSTEMI (non-ST elevated myocardial infarction) ([place])   . Peripheral vascular disease ([place])   . PONV (postoperative nausea and vomiting)   . Stroke [facility])    TIA history  . Type II diabetes mellitus (HCC)    on Lantus  . Vertigo    takes  Ativert prn   Assessment: 67 y.o. F presents with CP. To begin heparin for NSTEMI. No AC PTA.  Heparin level came back at 0.39, on 850 units/hr. Hgb 8.1, plt 258. No s/sx of bleeding. No infusion issues per nursing.   Goal of Therapy:  Heparin level 0.3-0.7 units/ml Monitor platelets by anticoagulation protocol: Yes   Plan:  Continue heparin gtt at 850 units/hr Daily heparin level and CBC  Thanks for allowing pharmacy to be a part of this patient's care.  Antonietta Jewel, PharmD, [place] Clinical Pharmacist  Pager: [phone] Phone: [phone]

## 2018-09-15 NOTE — Progress Notes (Signed)
CRITICAL VALUE ALERT  Critical Value: Troponin 1.26  Date & Time Notied:  09/15/18  0249  Provider Notified: Cardiology on call  Orders Received/Actions taken: pending orders.

## 2018-09-15 NOTE — ED Notes (Signed)
Patient's NTG drip currently infusing at 150 mcg/min , pt. denies chest pain at this time , BP markedly improved , respirations unlabored , IV sites intact , plan of care explained to pt.

## 2018-09-15 NOTE — Progress Notes (Addendum)
Progress Note  Patient Name: Jenna Brown Date of Encounter: 09/15/2018  Primary Cardiologist:  Glori Bickers, MD  Subjective   Chest pain has resolved, no SOB  Inpatient Medications    Scheduled Meds: . amLODipine  5 mg Oral Daily  . aspirin  81 mg Oral Daily  . atorvastatin  80 mg Oral Daily  . cholecalciferol  1,000 Units Oral Daily  . doxazosin  1 mg Oral QHS  . ferrous sulfate  325 mg Oral Q breakfast  . hydrALAZINE  50 mg Oral TID  . [START ON 09/16/2018] Influenza vac split quadrivalent PF  0.5 mL Intramuscular Tomorrow-1000  . insulin aspart  0-9 Units Subcutaneous TID WC  . insulin glargine  18 Units Subcutaneous BID  . isosorbide mononitrate  120 mg Oral Daily  . losartan  100 mg Oral Daily  . [START ON 09/16/2018] pneumococcal 23 valent vaccine  0.5 mL Intramuscular Tomorrow-1000  . [START ON 09/16/2018] ticagrelor  90 mg Oral BID   Continuous Infusions: . heparin 850 Units/hr (09/14/18 2139)  . nitroGLYCERIN 130 mcg/min (09/15/18 0804)   PRN Meds: acetaminophen, ALPRAZolam, furosemide, morphine injection, nitroGLYCERIN, ondansetron (ZOFRAN) IV, zolpidem   Vital Signs    Vitals:   09/15/18 0419 09/15/18 0519 09/15/18 0601 09/15/18 0700  BP: (!) 148/70 (!) 151/79 (!) 146/77 (!) 138/115  Pulse: (!) 105 (!) 103 95 (!) 106  Resp: 19 (!) 24 (!) 23 19  Temp:   98 F (36.7 C)   TempSrc:   Oral   SpO2: 97% 96% 95% 96%  Weight:   72.1 kg     Intake/Output Summary (Last 24 hours) at 09/15/2018 0814 Last data filed at 09/15/2018 0600 Gross per 24 hour  Intake 314 ml  Output -  Net 314 ml   Filed Weights   09/15/18 0601  Weight: 72.1 kg    Telemetry    SR - Personally Reviewed  ECG    12/28, ST, HR 113, Twave changes are similar to 08/01/2018 - Personally Reviewed  Physical Exam   General: Well developed, well nourished, female appearing in no acute distress. Head: Normocephalic, atraumatic.  Neck: Supple without bruits, JVD not  elevated. Lungs:  Resp regular and unlabored, CTA. Heart: RRR, S1, S2, no S3, S4, or murmur; no rub. Abdomen: Soft, non-tender, non-distended with normoactive bowel sounds. No hepatomegaly. No rebound/guarding. No obvious abdominal masses. Extremities: No clubbing, cyanosis, no edema. Distal pedal pulses are 1+ LLE, s/p R AKA Neuro: Alert and oriented X 3. Moves all extremities spontaneously. Psych: Normal affect.  Labs    Hematology Recent Labs  Lab 09/14/18 2105 09/14/18 2111 09/15/18 0522  WBC 8.8  --  11.4*  RBC 3.82*  --  3.24*  HGB 9.5* 10.9* 8.1*  HCT 35.3* 32.0* 28.7*  MCV 92.4  --  88.6  MCH 24.9*  --  25.0*  MCHC 26.9*  --  28.2*  RDW 14.7  --  14.8  PLT 285  --  258    Chemistry Recent Labs  Lab 09/14/18 2105 09/14/18 2111  NA 142 143  K 5.3* 5.4*  CL 115* 117*  CO2 19*  --   GLUCOSE 205* 197*  BUN 42* 49*  CREATININE 3.77* 3.80*  CALCIUM 8.5*  --   PROT 6.3*  --   ALBUMIN 3.2*  --   AST 32  --   ALT 38  --   ALKPHOS 83  --   BILITOT 0.8  --   GFRNONAA 12*  --  GFRAA 14*  --   ANIONGAP 8  --      Cardiac Enzymes Recent Labs  Lab 09/15/18 0115 09/15/18 0522  TROPONINI 1.26* 3.79*    Recent Labs  Lab 09/14/18 2109  TROPIPOC 0.07     BNP Recent Labs  Lab 09/15/18 0115  BNP 131.0*     Radiology    Dg Chest Port 1 View  Result Date: 09/14/2018 CLINICAL DATA:  Acute onset of left lower chest pain, vomiting and mild shortness of breath. EXAM: PORTABLE CHEST 1 VIEW COMPARISON:  Chest radiograph performed 04/19/2018 FINDINGS: The lungs are well-aerated and clear. There is no evidence of focal opacification, pleural effusion or pneumothorax. The cardiomediastinal silhouette is within normal limits. No acute osseous abnormalities are seen. IMPRESSION: No acute cardiopulmonary process seen. Electronically Signed   By: Garald Balding M.D.   On: 09/14/2018 22:16     Cardiac Studies   ECHO:  Ordered,   ECHO: 08/01/2018  - Left  ventricle: The cavity size was normal. There was mild focal   basal hypertrophy of the septum. Systolic function was mildly   reduced. The estimated ejection fraction was in the range of 45%   to 50%. Hypokinesis of the apicalinferolateral, inferior,   inferoseptal, and apical myocardium. - Aortic valve: Trileaflet; mildly thickened, mildly calcified   leaflets. Left coronary cusp mobility was moderately restricted. - Mitral valve: Calcified annulus. Mildly thickened leaflets .   There was trivial regurgitation. - Right ventricle: The cavity size was mildly dilated. Wall   thickness was normal. Systolic function was low normal. - Atrial septum: No defect or patent foramen ovale was identified. - Tricuspid valve: There was no significant regurgitation. - Pulmonic valve: There was trivial regurgitation.  Impressions:  - EF using wall tracking approximately 45%. Hypokinesis of distal   inferior, inferolateral, inferoseptal, and apical walls. Wall   motion visually appears similar to prior. Thickened LCC of aortic   valve appears similar to prior.  04-13-15 LHC  Prox LAD to Mid LAD lesion, 90% stenosed.  Ost Cx to Mid Cx lesion, 95% stenosed.  Ost 2nd Mrg to 2nd Mrg lesion, 80% stenosed.  Mid Cx lesion, 90% stenosed.  Prox RCA lesion, 60% stenosed.  Mid RCA lesion, 50% stenosed.   Patient Profile     67 y.o. female w/ hx severe CAD by cath 2016>>med rx (not CABG or PCI candidate), CKD IV/V, DM, HTN, HLD, GERD, TIA, who was admitted 12/27 w/ NSTEMI and HTN urgency.   Assessment & Plan    Principal Problem: 1.  Non-ST elevation (NSTEMI) myocardial infarction ([place]) - Cancel NPO as sx are controlled and do not see benefit for stress test - MD advise on reviewing films to see if any PCI options.  - cath is high risk due to poor renal function, may need to wait till she is on HD - continue IV nitro/heparin for now, hold Imdur - restart Coreg at lower dose and follow  2. CKD  IV/V - may be contributing to high BP, difficult to control  3. HTN:  - BP is better on high-dose nitro in addition to home rx - hydralazine was 100 mg tid in 08/01/18 appt w/ Dr Haroldine Laws - not on max dose amlodipine, also an option - restart BB and follow. - per IM  Otherwise, per IM Active Problems:   Stroke ([place])   Anxiety   Anemia   Essential hypertension   Hyperlipidemia LDL goal <70   Hyperkalemia   Chronic  systolic heart failure (HCC)   Chest pain   CAD in native artery   CKD (chronic kidney disease) stage 5, GFR less than 15 ml/min [facility])   Coronary artery disease   Hypertensive urgency   NSTEMI (non-ST elevated myocardial infarction) ([place])  Signed, Rosaria Ferries , PA-C 8:14 AM 09/15/2018 Pager: [phone]

## 2018-09-15 NOTE — Progress Notes (Signed)
PROGRESS NOTE    Jenna Brown  UDJ:[id number] DOB: 1951/07/30 DOA: 09/14/2018 PCP: [name], MD   Brief Narrative:  HPI on 09/14/2018 by Dr. Ivor Costa Jenna Brown is a 67 y.o. female with medical history significant of hypertension, hyperlipidemia, diabetes mellitus, stroke, iron deficiency anemia, CKD- 5, CAD, s/p of right AKA, vertigo, PAD, sCHF with EF 45%, anxiety, who presents with chest pain.  Patient states that her chest pain started at about 6 PM, which is located in the left lower chest, constant, 8 out of 10 in severity, pressure-like, nonradiating.  Patient's nausea vomiting.  Denies abdominal pain or diarrhea.  It is associated with shortness breath, but no cough, fever or chills.  No symptoms of UTI or unilateral weakness.  No tenderness in the calf areas.  Assessment & Plan   Chest pain, NSTEMI -history of CAD; has known three-vessel disease. -Presented with chest pain -Troponin peaked at 3.79 -Currently on heparin and nitroglycerin drips -Cardiology consulted and appreciated -Continue Imdur, dose was increased 220 mg -Cardiology restarting Coreg 12.5 mg twice daily, hydralazine changed to 50 mg 3 times daily.  Trying to wean nitroglycerin drip.  May want to consider addition of Ranexa if she continues to have chest pain as an outpatient -Continue amlodipine, losartan, doxazosin, aspirin, Brilinta  Essential hypertension/hypertensive urgency -Upon admission, blood pressure was 192/108, continues to be elevated however improved to 151/76 -Weaning nitroglycerin drip -Continue amlodipine, losartan, doxazosin -As above, cardiology restarting Coreg and changing hydralazine dose  Chronic systolic heart failure -Echocardiogram 08/01/2018 showed an EF of 45 to 50% -Patient currently appears to be compensated and euvolemic -BNP on admission 131 -Monitor intake and output, daily weights  Chronic kidney disease, stage V -Creatinine appears to be  stable -Continue to monitor BMP  Anemia of chronic disease -Baseline hemoglobin ranges from 7-9 -Currently hemoglobin 8.1, will continue to monitor CBC  Hyperkalemia -Was given Kayexalate -Continue to monitor BMP  History of stroke -Continue statin, aspirin  Anxiety -Continue Ativan as needed  DVT Prophylaxis heparin  Code Status: Full  Family Communication: None at bedside  Disposition Plan: Admitted.  Disposition pending further cardiology recommendations  Consultants Cardiology  Procedures  None  Antibiotics   Anti-infectives (From admission, onward)   Start     Dose/Rate Route Frequency Ordered Stop   09/15/18 0100  cefTRIAXone (ROCEPHIN) 1 g in sodium chloride 0.9 % 100 mL IVPB     1 g 200 mL/hr over 30 Minutes Intravenous  Once 09/15/18 0057 09/15/18 0144      Subjective:   Jenna Brown seen and examined today.  Currently denies chest pain, shortness breath, abdominal pain, nausea vomiting, diarrhea constipation, dizziness or headache.  Objective:   Vitals:   09/15/18 1045 09/15/18 1134 09/15/18 1147 09/15/18 1306  BP: (!) 185/98 (!) 141/74  (!) 151/76  Pulse: (!) 114 (!) 105 (!) 101 96  Resp: (!) 30 13 (!) 26 17  Temp:    99.1 F (37.3 C)  TempSrc:    Oral  SpO2: 99% 96% 96% 99%  Weight:        Intake/Output Summary (Last 24 hours) at 09/15/2018 1311 Last data filed at 09/15/2018 0600 Gross per 24 hour  Intake 314 ml  Output -  Net 314 ml   Filed Weights   09/15/18 0601  Weight: 72.1 kg    Exam  General: Well developed, well nourished, NAD, appears stated age  47: NCAT, mucous membranes moist.   Neck: Supple  Cardiovascular: S1  S2 auscultated, RRR, no murmur  Respiratory: Clear to auscultation bilaterally with equal chest rise  Abdomen: Soft, nontender, nondistended, + bowel sounds  Extremities: LLE edema 1+.  Right AKA.  Neuro: AAOx3, nonfocal  Psych: Mood and affect, pleasant   Data Reviewed: I have personally  reviewed following labs and imaging studies  CBC: Recent Labs  Lab 09/14/18 2105 09/14/18 2111 09/15/18 0522  WBC 8.8  --  11.4*  NEUTROABS 5.5  --   --   HGB 9.5* 10.9* 8.1*  HCT 35.3* 32.0* 28.7*  MCV 92.4  --  88.6  PLT 285  --  938   Basic Metabolic Panel: Recent Labs  Lab 09/14/18 2105 09/14/18 2111  NA 142 143  K 5.3* 5.4*  CL 115* 117*  CO2 19*  --   GLUCOSE 205* 197*  BUN 42* 49*  CREATININE 3.77* 3.80*  CALCIUM 8.5*  --    GFR: Estimated Creatinine Clearance: 14.2 mL/min (A) (by C-G formula based on SCr of 3.8 mg/dL (H)). Liver Function Tests: Recent Labs  Lab 09/14/18 2105  AST 32  ALT 38  ALKPHOS 83  BILITOT 0.8  PROT 6.3*  ALBUMIN 3.2*   Recent Labs  Lab 09/14/18 2153  LIPASE 60*   No results for input(s): AMMONIA in the last 168 hours. Coagulation Profile: Recent Labs  Lab 09/14/18 2105  INR 0.98   Cardiac Enzymes: Recent Labs  Lab 09/15/18 0115 09/15/18 0522 09/15/18 1147  TROPONINI 1.26* 3.79* 3.34*   BNP (last 3 results) No results for input(s): PROBNP in the last 8760 hours. HbA1C: Recent Labs    09/15/18 0522  HGBA1C 5.9*   CBG: Recent Labs  Lab 09/15/18 0826 09/15/18 1144  GLUCAP 128* 150*   Lipid Profile: Recent Labs    09/15/18 0522  CHOL 162  HDL 45  LDLCALC 107*  TRIG 51  CHOLHDL 3.6   Thyroid Function Tests: No results for input(s): TSH, T4TOTAL, FREET4, T3FREE, THYROIDAB in the last 72 hours. Anemia Panel: No results for input(s): VITAMINB12, FOLATE, FERRITIN, TIBC, IRON, RETICCTPCT in the last 72 hours. Urine analysis:    Component Value Date/Time   COLORURINE YELLOW 09/14/2018 2330   APPEARANCEUR CLOUDY (A) 09/14/2018 2330   LABSPEC 1.014 09/14/2018 2330   PHURINE 7.0 09/14/2018 2330   GLUCOSEU 50 (A) 09/14/2018 2330   HGBUR NEGATIVE 09/14/2018 2330   BILIRUBINUR NEGATIVE 09/14/2018 2330   KETONESUR 5 (A) 09/14/2018 2330   PROTEINUR 100 (A) 09/14/2018 2330   UROBILINOGEN 0.2 04/10/2015  1248   NITRITE NEGATIVE 09/14/2018 2330   LEUKOCYTESUR TRACE (A) 09/14/2018 2330   Sepsis Labs: @LABRCNTIP (procalcitonin:4,lacticidven:4)  )No results found for this or any previous visit (from the past 240 hour(s)).    Radiology Studies: Dg Chest Port 1 View  Result Date: 09/14/2018 CLINICAL DATA:  Acute onset of left lower chest pain, vomiting and mild shortness of breath. EXAM: PORTABLE CHEST 1 VIEW COMPARISON:  Chest radiograph performed 04/19/2018 FINDINGS: The lungs are well-aerated and clear. There is no evidence of focal opacification, pleural effusion or pneumothorax. The cardiomediastinal silhouette is within normal limits. No acute osseous abnormalities are seen. IMPRESSION: No acute cardiopulmonary process seen. Electronically Signed   By: Garald Balding M.D.   On: 09/14/2018 22:16     Scheduled Meds: . amLODipine  5 mg Oral Daily  . aspirin  81 mg Oral Daily  . atorvastatin  80 mg Oral Daily  . carvedilol  12.5 mg Oral BID WC  . cholecalciferol  1,000  Units Oral Daily  . doxazosin  1 mg Oral QHS  . ferrous sulfate  325 mg Oral Q breakfast  . hydrALAZINE  50 mg Oral TID  . [START ON 09/16/2018] Influenza vac split quadrivalent PF  0.5 mL Intramuscular Tomorrow-1000  . insulin aspart  0-9 Units Subcutaneous TID WC  . insulin glargine  18 Units Subcutaneous BID  . isosorbide mononitrate  120 mg Oral Daily  . losartan  100 mg Oral Daily  . [START ON 09/16/2018] pneumococcal 23 valent vaccine  0.5 mL Intramuscular Tomorrow-1000  . [START ON 09/16/2018] ticagrelor  90 mg Oral BID   Continuous Infusions: . heparin 850 Units/hr (09/14/18 2139)     LOS: 0 days   Time Spent in minutes   30 minutes  Caniya Tagle D.O. on 09/15/2018 at 1:11 PM  Between 7am to 7pm - Please see pager noted on [email or web address]  After 7pm go to [email or web address]  And look for the night coverage person covering for me after hours  [facility] Office  [phone]

## 2018-09-15 NOTE — ED Notes (Signed)
EKG result given to Dr. Blaine Hamper .

## 2018-09-15 NOTE — Progress Notes (Signed)
Nutrition Brief Note  Patient identified on the Malnutrition Screening Tool (MST) Report.  Wt Readings from Last 15 Encounters:  09/15/18 72.1 kg  07/12/18 72.1 kg  07/02/18 72.6 kg  05/11/18 72.8 kg  04/23/18 72.7 kg  02/27/18 72.1 kg  01/29/18 73.4 kg  01/15/18 75 kg  03/30/17 74.9 kg  01/18/17 69.1 kg  01/03/17 73.3 kg  08/11/15 65.8 kg  07/27/15 65.7 kg  06/23/15 67.1 kg  06/18/15 68.5 kg   Body mass index is 27.71 kg/m. Patient meets criteria for Overweight based on current BMI.   Current diet order is Renal/Carbohydrate Modified. Pt reports a good appetite; had a chicken sandwich for lunch. Labs and medications reviewed.   No nutrition interventions warranted at this time. If nutrition issues arise, please consult RD.   Arthur Holms, RD, LDN Pager #: [phone] After-Hours Pager #: [phone]

## 2018-09-15 NOTE — Progress Notes (Signed)
[place] for Heparin Indication: NSTEMI  Allergies  Allergen Reactions  . Plavix [Clopidogrel Bisulfate] Itching  . Codeine Other (See Comments)    "makes me feel strange"  . Tylenol With Codeine #3 [Acetaminophen-Codeine] Other (See Comments)    "doesn't make me feel right"    Patient Measurements: Weight: 158 lb 14.4 oz (72.1 kg)  Ht: 63.5 in Wt: 73 kg IBW: 54 kg Heparin Dosing Weight: 70 kg  Vital Signs: Temp: 98 F (36.7 C) (12/28 0601) Temp Source: Oral (12/28 0601) BP: 146/77 (12/28 0601) Pulse Rate: 95 (12/28 0601)  Labs: Recent Labs    09/14/18 2105 09/14/18 2111 09/15/18 0115 09/15/18 0522  HGB 9.5* 10.9*  --  8.1*  HCT 35.3* 32.0*  --  28.7*  PLT 285  --   --  258  LABPROT 12.9  --   --   --   INR 0.98  --   --   --   HEPARINUNFRC  --   --   --  0.60  CREATININE 3.77* 3.80*  --   --   TROPONINI  --   --  1.26*  --     Estimated Creatinine Clearance: 14.2 mL/min (A) (by C-G formula based on SCr of 3.8 mg/dL (H)).   Medical History: Past Medical History:  Diagnosis Date  . Anemia   . Anxiety    takes Xanax prn  . Arthritis   . CKD (chronic kidney disease), stage IV ([place])   . Coronary artery disease 2016   cath w/ 90% LAD, 95%CFX, 80% OM 2, 60% RCA, not CABG candidate, rx medically  . GERD (gastroesophageal reflux disease)   . H/O hiatal hernia   . Headache(784.0)    when b/p is elevated  . Hemorrhoids   . Hx of AKA (above knee amputation), right ([place])   . Hyperlipidemia    takes Simvastatin daily  . Hypertension    takes Amlodipine/HCTZ/Losartan daily  . Migraines   . Myocardial infarction ([place]) 1990's  . NSTEMI (non-ST elevated myocardial infarction) ([place])   . Peripheral vascular disease ([employer])   . PONV (postoperative nausea and vomiting)   . Stroke [facility])    TIA history  . Type II diabetes mellitus ([facility])    on Lantus  . Vertigo    takes Ativert prn    Medications:  Awaiting med  rec  Assessment: 67 y.o. F presents with CP. To begin heparin for NSTEMI. CBC ok on admission. No AC PTA. Initial heparin level 0.6 units/ml  Goal of Therapy:  Heparin level 0.3-0.7 units/ml Monitor platelets by anticoagulation protocol: Yes   Plan:  Continue heparin gtt at 850 units/hr Will f/u heparin level in 6 hours to confirm Daily heparin level and CBC  Thanks for allowing pharmacy to be a part of this patient's care.  Excell Seltzer, PharmD Clinical Pharmacist

## 2018-09-16 DIAGNOSIS — F419 Anxiety disorder, unspecified: Secondary | ICD-10-CM

## 2018-09-16 DIAGNOSIS — D508 Other iron deficiency anemias: Secondary | ICD-10-CM

## 2018-09-16 DIAGNOSIS — I639 Cerebral infarction, unspecified: Secondary | ICD-10-CM

## 2018-09-16 DIAGNOSIS — E875 Hyperkalemia: Secondary | ICD-10-CM

## 2018-09-16 DIAGNOSIS — I25110 Atherosclerotic heart disease of native coronary artery with unstable angina pectoris: Secondary | ICD-10-CM

## 2018-09-16 LAB — TROPONIN I
TROPONIN I: 2.38 ng/mL — AB (ref <0.03)
Troponin I: 1.84 ng/mL (ref <0.03)

## 2018-09-16 LAB — GLUCOSE, CAPILLARY
Glucose-Capillary: 72 mg/dL (ref 70–99)
Glucose-Capillary: 89 mg/dL (ref 70–99)

## 2018-09-16 LAB — BASIC METABOLIC PANEL
Anion gap: 10 (ref 5–15)
BUN: 39 mg/dL — ABNORMAL HIGH (ref 8–23)
CO2: 19 mmol/L — ABNORMAL LOW (ref 22–32)
Calcium: 7.9 mg/dL — ABNORMAL LOW (ref 8.9–10.3)
Chloride: 114 mmol/L — ABNORMAL HIGH (ref 98–111)
Creatinine, Ser: 3.9 mg/dL — ABNORMAL HIGH (ref 0.44–1.00)
GFR calc Af Amer: 13 mL/min — ABNORMAL LOW (ref >60)
GFR calc non Af Amer: 11 mL/min — ABNORMAL LOW (ref >60)
Glucose, Bld: 81 mg/dL (ref 70–99)
Potassium: 4.8 mmol/L (ref 3.5–5.1)
SODIUM: 143 mmol/L (ref 135–145)

## 2018-09-16 LAB — CBC
HCT: 28.3 % — ABNORMAL LOW (ref 36.0–46.0)
Hemoglobin: 8 g/dL — ABNORMAL LOW (ref 12.0–15.0)
MCH: 25.2 pg — ABNORMAL LOW (ref 26.0–34.0)
MCHC: 28.3 g/dL — ABNORMAL LOW (ref 30.0–36.0)
MCV: 89.3 fL (ref 80.0–100.0)
Platelets: 246 10*3/uL (ref 150–400)
RBC: 3.17 MIL/uL — ABNORMAL LOW (ref 3.87–5.11)
RDW: 15.2 % (ref 11.5–15.5)
WBC: 8.8 10*3/uL (ref 4.0–10.5)
nRBC: 0 % (ref 0.0–0.2)

## 2018-09-16 LAB — HEPARIN LEVEL (UNFRACTIONATED): Heparin Unfractionated: 0.45 IU/mL (ref 0.30–0.70)

## 2018-09-16 MED ORDER — CARVEDILOL 6.25 MG PO TABS: 18.7500 mg | ORAL_TABLET | Freq: Two times a day (BID) | ORAL | Status: DC

## 2018-09-16 MED ORDER — ISOSORBIDE MONONITRATE ER 120 MG PO TB24: 120.0000 mg | ORAL_TABLET | Freq: Every day | ORAL | 0 refills | Status: DC

## 2018-09-16 MED ORDER — HEPARIN SODIUM (PORCINE) 5000 UNIT/ML IJ SOLN: 5000.0000 [IU] | Freq: Three times a day (TID) | INTRAMUSCULAR | Status: DC

## 2018-09-16 MED ORDER — DOXAZOSIN MESYLATE 1 MG PO TABS: 1.0000 mg | ORAL_TABLET | Freq: Every day | ORAL | 0 refills | Status: DC

## 2018-09-16 NOTE — Progress Notes (Signed)
Progress Note  Patient Name: Jenna Brown Date of Encounter: 09/16/2018  Primary Cardiologist:  Glori Bickers, MD  Subjective   Denies any chest pain or shortness of breath Inpatient Medications    Scheduled Meds: . amLODipine  5 mg Oral Daily  . aspirin  81 mg Oral Daily  . atorvastatin  80 mg Oral Daily  . carvedilol  12.5 mg Oral BID WC  . cholecalciferol  1,000 Units Oral Daily  . doxazosin  1 mg Oral QHS  . ferrous sulfate  325 mg Oral Q breakfast  . hydrALAZINE  50 mg Oral TID  . Influenza vac split quadrivalent PF  0.5 mL Intramuscular Tomorrow-1000  . insulin aspart  0-9 Units Subcutaneous TID WC  . insulin glargine  18 Units Subcutaneous BID  . isosorbide mononitrate  120 mg Oral Daily  . losartan  100 mg Oral Daily  . pneumococcal 23 valent vaccine  0.5 mL Intramuscular Tomorrow-1000  . ticagrelor  90 mg Oral BID   Continuous Infusions: . heparin 850 Units/hr (09/16/18 0418)   PRN Meds: acetaminophen, ALPRAZolam, furosemide, morphine injection, nitroGLYCERIN, ondansetron (ZOFRAN) IV, zolpidem   Vital Signs    Vitals:   09/15/18 1306 09/15/18 2016 09/16/18 0539 09/16/18 0741  BP: (!) 151/76 (!) 145/74 (!) 150/72 140/68  Pulse: 96 93 91 96  Resp: 17 18 10  (!) 24  Temp: 99.1 F (37.3 C) 98.9 F (37.2 C) 98.8 F (37.1 C) 99 F (37.2 C)  TempSrc: Oral Oral Oral Oral  SpO2: 99% 99% 97% 98%  Weight:   72.7 kg     Intake/Output Summary (Last 24 hours) at 09/16/2018 6295 Last data filed at 09/15/2018 1307 Gross per 24 hour  Intake 360 ml  Output -  Net 360 ml   Filed Weights   09/15/18 0601 09/16/18 0539  Weight: 72.1 kg 72.7 kg    Telemetry    Normal sinus rhythm- Personally Reviewed  ECG    No new EKG to review today- Personally Reviewed  Physical Exam   GEN: Well nourished, well developed in no acute distress HEENT: Normal NECK: No JVD; No carotid bruits LYMPHATICS: No lymphadenopathy CARDIAC:RRR, no murmurs, rubs,  gallops RESPIRATORY:  Clear to auscultation without rales, wheezing or rhonchi  ABDOMEN: Soft, non-tender, non-distended MUSCULOSKELETAL:  No edema; No deformity  SKIN: Warm and dry NEUROLOGIC:  Alert and oriented x 3 PSYCHIATRIC:  Normal affect    Labs    Hematology Recent Labs  Lab 09/14/18 2105 09/14/18 2111 09/15/18 0522 09/16/18 0536  WBC 8.8  --  11.4* 8.8  RBC 3.82*  --  3.24* 3.17*  HGB 9.5* 10.9* 8.1* 8.0*  HCT 35.3* 32.0* 28.7* 28.3*  MCV 92.4  --  88.6 89.3  MCH 24.9*  --  25.0* 25.2*  MCHC 26.9*  --  28.2* 28.3*  RDW 14.7  --  14.8 15.2  PLT 285  --  258 246    Chemistry Recent Labs  Lab 09/14/18 2105 09/14/18 2111 09/16/18 0536  NA 142 143 143  K 5.3* 5.4* 4.8  CL 115* 117* 114*  CO2 19*  --  19*  GLUCOSE 205* 197* 81  BUN 42* 49* 39*  CREATININE 3.77* 3.80* 3.90*  CALCIUM 8.5*  --  7.9*  PROT 6.3*  --   --   ALBUMIN 3.2*  --   --   AST 32  --   --   ALT 38  --   --   ALKPHOS 83  --   --  BILITOT 0.8  --   --   GFRNONAA 12*  --  11*  GFRAA 14*  --  13*  ANIONGAP 8  --  10     Cardiac Enzymes Recent Labs  Lab 09/15/18 0115 09/15/18 0522 09/15/18 1147 09/16/18 0536  TROPONINI 1.26* 3.79* 3.34* 2.38*    Recent Labs  Lab 09/14/18 2109  TROPIPOC 0.07     BNP Recent Labs  Lab 09/15/18 0115  BNP 131.0*     Radiology    Dg Chest Port 1 View  Result Date: 09/14/2018 CLINICAL DATA:  Acute onset of left lower chest pain, vomiting and mild shortness of breath. EXAM: PORTABLE CHEST 1 VIEW COMPARISON:  Chest radiograph performed 04/19/2018 FINDINGS: The lungs are well-aerated and clear. There is no evidence of focal opacification, pleural effusion or pneumothorax. The cardiomediastinal silhouette is within normal limits. No acute osseous abnormalities are seen. IMPRESSION: No acute cardiopulmonary process seen. Electronically Signed   By: Garald Balding M.D.   On: 09/14/2018 22:16     Cardiac Studies   ECHO:  Ordered,   ECHO:  08/01/2018  - Left ventricle: The cavity size was normal. There was mild focal   basal hypertrophy of the septum. Systolic function was mildly   reduced. The estimated ejection fraction was in the range of 45%   to 50%. Hypokinesis of the apicalinferolateral, inferior,   inferoseptal, and apical myocardium. - Aortic valve: Trileaflet; mildly thickened, mildly calcified   leaflets. Left coronary cusp mobility was moderately restricted. - Mitral valve: Calcified annulus. Mildly thickened leaflets .   There was trivial regurgitation. - Right ventricle: The cavity size was mildly dilated. Wall   thickness was normal. Systolic function was low normal. - Atrial septum: No defect or patent foramen ovale was identified. - Tricuspid valve: There was no significant regurgitation. - Pulmonic valve: There was trivial regurgitation.  Impressions:  - EF using wall tracking approximately 45%. Hypokinesis of distal   inferior, inferolateral, inferoseptal, and apical walls. Wall   motion visually appears similar to prior. Thickened LCC of aortic   valve appears similar to prior.  04-13-15 LHC  Prox LAD to Mid LAD lesion, 90% stenosed.  Ost Cx to Mid Cx lesion, 95% stenosed.  Ost 2nd Mrg to 2nd Mrg lesion, 80% stenosed.  Mid Cx lesion, 90% stenosed.  Prox RCA lesion, 60% stenosed.  Mid RCA lesion, 50% stenosed.   Patient Profile     67 y.o. female w/ hx severe CAD by cath 2016>>med rx (not CABG or PCI candidate), CKD IV/V, DM, HTN, HLD, GERD, TIA, who was admitted 12/27 w/ NSTEMI and HTN urgency.   Assessment & Plan    Principal Problem: 1.  Chest pain with elevated troponin - Felt to be secondary to demand ischemia in the setting of hypertensive urgency and known underlying CAD not amenable to PCI - known severe CAD but deemed not a surgical candidate or candidate for PCI and medically managed -She was admitted back in August with chest pain at that time and elevated troponin and her  prior angiogram in 2016 was reviewed and she has limited options for PCI and intervention would carry a significant increased risk for ARF and need for dialysis therefore it was decided to treat her medically going forward. - Troponin 1.26>3.79>3.34>2.38 - Stop IV heparin drip - Repeat echo to make sure no significant change to EF - Continue aspirin 81 mg daily, Brilinta 90 mg twice daily, long-acting nitrate 120 mg daily, carvedilol and high-dose  statin - If she develops further chest pain as an outpatient could start Ranexa  2. CKD IV/V - may be contributing to high BP, difficult to control  3. HTN:  - Blood pressure much improved after adjusting medications yesterday - We will increase carvedilol back to home dose of 18.75 mg twice daily - Continue amlodipine 5 mg daily, doxazosin 1 mg nightly, hydralazine 50 mg 3 times daily and losartan 100 mg daily  [facility] will sign off.   Medication Recommendations: Aspirin 81 mg daily, Brilinta 90 mg twice daily, atorvastatin 80 mg daily, amlodipine 5 mg daily, carvedilol 18.75 mg twice daily, hydralazine 50 mg 3 times daily, doxazosin 1 mg daily, Imdur 120 mg daily, losartan 100 mg daily Other recommendations (labs, testing, etc): None Follow up as an outpatient: 7 to 10 days with extender in our office   SignedFransico Him , PA-C 8:52 AM 09/16/2018 Pager: [phone]

## 2018-09-16 NOTE — Discharge Summary (Signed)
Physician Discharge Summary  Jenna Brown:[id number] DOB: 04-15-1951 DOA: 09/14/2018  PCP: [name], MD  Admit date: 09/14/2018 Discharge date: 09/16/2018  Time spent: 45 minutes  Recommendations for Outpatient Follow-up:  Patient will be discharged to home.  Patient will need to follow up with primary care provider within one week of discharge.  Follow up with cardiology in 7-10 days. Patient should continue medications as prescribed.  Patient should follow a heart healthy diet.   Discharge Diagnoses:  Chest pain, NSTEMI Essential hypertension/hypertensive urgency Chronic systolic heart failure Chronic kidney disease, stage V Anemia of chronic disease Hyperkalemia History of stroke Anxiety  Discharge Condition: Stable  Diet recommendation: Heart heallt  Filed Weights   09/15/18 0601 09/16/18 0539  Weight: 72.1 kg 72.7 kg    History of present illness:  on 09/14/2018 by Dr. [name] a 67 y.o.femalewith medical history significant ofhypertension, hyperlipidemia, diabetes mellitus, stroke, iron deficiency anemia, CKD- 5, CAD,s/p ofright AKA,vertigo, PAD, sCHFwith EF 45%, anxiety, who presents with chest pain.  Patient states that her chest pain started at about 6 PM, which is located in the left lower chest, constant, 8 out of 10 in severity, pressure-like, nonradiating. Patient's nausea vomiting. Denies abdominal pain or diarrhea. It is associated with shortness breath, but no cough, fever or chills. No symptoms of UTI or unilateral weakness. No tenderness in the calf areas.   Hospital Course:  Chest pain, NSTEMI -history of CAD; has known three-vessel disease. -Presented with chest pain -Troponin peaked at 3.79, however now trending downward 2.38 -was placed on heparin and nitroglycerin drips -Cardiology consulted and appreciated -Continue Imdur, dose was increased 220 mg -Cardiology restarting Coreg 12.5 mg twice daily,  hydralazine changed to 50 mg 3 times daily.  Trying to wean nitroglycerin drip.  May want to consider addition of Ranexa if she continues to have chest pain as an outpatient -Continue amlodipine, losartan, doxazosin, aspirin, Brilinta -Patient's for discharge per cardiology: Aspirin 81 mg daily, Brilinta 90 mg twice daily, atorvastatin 80 mg daily, amlodipine 5 mg daily, carvedilol 18.75 mg twice daily, hydralazine 50 mg 3 times daily, doxazosin 1 mg daily, Imdur 120 mg daily, losartan 100 mg daily  Essential hypertension/hypertensive urgency -Upon admission, blood pressure was 192/108, continues to be elevated however improved to 151/76 -Continue medications as above  Chronic systolic heart failure -Echocardiogram 08/01/2018 showed an EF of 45 to 50% -Patient currently appears to be compensated and euvolemic -BNP on admission 131 -Monitor intake and output, daily weights  Chronic kidney disease, stage V -Creatinine appears to be stable -Continue to monitor BMP  Anemia of chronic disease -Baseline hemoglobin ranges from 7-9 -Currently hemoglobin 8, will continue to monitor CBC  Hyperkalemia -Was given Kayexalate -Continue to monitor BMP  History of stroke -Continue statin, aspirin  Anxiety -Continue Ativan as needed  Procedures: None  Consultations: Cardiology  Discharge Exam: Vitals:   09/16/18 0539 09/16/18 0741  BP: (!) 150/72 140/68  Pulse: 91 96  Resp: 10 (!) 24  Temp: 98.8 F (37.1 C) 99 F (37.2 C)  SpO2: 97% 98%   No longer having chest pain.  Denies current shortness of breath, abdominal pain, nausea, vomiting, diarrhea, constipation, dizziness or headache.  Would like to go home today.   General: Well developed, well nourished, NAD, appears stated age  HEENT: NCAT, mucous membranes moist.  Neck: Supple  Cardiovascular: S1 S2 auscultated, no rubs, murmurs or gallops. Regular rate and rhythm.  Respiratory: Clear to auscultation bilaterally  with equal chest rise  Abdomen: Soft, nontender, nondistended, + bowel sounds  Extremities: Right AKA, no edema of the LLE  Neuro: AAOx3, nonfocal  Psych: Pleasant, appropriate mood and affect  Discharge Instructions Discharge Instructions    Discharge instructions   Complete by:  As directed    Patient will be discharged to home.  Patient will need to follow up with primary care provider within one week of discharge.  Follow up with cardiology in 7-10 days. Patient should continue medications as prescribed.  Patient should follow a heart healthy diet.     Allergies as of 09/16/2018      Reactions   Plavix [clopidogrel Bisulfate] Itching   Codeine Other (See Comments)   "makes me feel strange"   Tylenol With Codeine #3 [acetaminophen-codeine] Other (See Comments)   "doesn't make me feel right"      Medication List    STOP taking these medications   carvedilol 12.5 MG tablet Commonly known as:  COREG   ezetimibe 10 MG tablet Commonly known as:  ZETIA     TAKE these medications   acetaminophen 500 MG tablet Commonly known as:  TYLENOL Take 500 mg by mouth every 6 (six) hours as needed for mild pain.   ALPRAZolam 0.25 MG tablet Commonly known as:  XANAX Take 1 tablet (0.25 mg total) by mouth 3 (three) times daily as needed for anxiety. What changed:  when to take this   amLODipine 5 MG tablet Commonly known as:  NORVASC Take 1 tablet (5 mg total) by mouth daily.   aspirin 81 MG chewable tablet Chew 81 mg by mouth daily.   atorvastatin 80 MG tablet Commonly known as:  LIPITOR Take 80 mg by mouth daily.   doxazosin 1 MG tablet Commonly known as:  CARDURA Take 1 tablet (1 mg total) by mouth at bedtime. What changed:    medication strength  how much to take  when to take this   ferrous sulfate 325 (65 FE) MG tablet Take 325 mg by mouth daily with breakfast.   furosemide 40 MG tablet Commonly known as:  LASIX Take 1 tablet (40 mg total) by mouth daily  as needed. What changed:  reasons to take this   hydrALAZINE 50 MG tablet Commonly known as:  APRESOLINE Take 50 mg by mouth 3 (three) times daily. What changed:  Another medication with the same name was removed. Continue taking this medication, and follow the directions you see here.   insulin glargine 100 UNIT/ML injection Commonly known as:  LANTUS Inject 0.25 mLs (25 Units total) into the skin 2 (two) times daily. What changed:  when to take this   isosorbide mononitrate 120 MG 24 hr tablet Commonly known as:  IMDUR Take 1 tablet (120 mg total) by mouth daily. Start taking on:  September 17, 2018 What changed:    medication strength  how much to take  Another medication with the same name was removed. Continue taking this medication, and follow the directions you see here.   losartan 100 MG tablet Commonly known as:  COZAAR Take 100 mg by mouth daily.   nitroGLYCERIN 0.4 MG SL tablet Commonly known as:  NITROSTAT Place 1 tablet (0.4 mg total) under the tongue every 5 (five) minutes x 3 doses as needed for chest pain.   ticagrelor 90 MG Tabs tablet Commonly known as:  BRILINTA Take 1 tablet (90 mg total) by mouth 2 (two) times daily.   Vitamin D-3 25 MCG (1000 UT)  Caps Take 1,000 Units by mouth daily.      Allergies  Allergen Reactions  . Plavix [Clopidogrel Bisulfate] Itching  . Codeine Other (See Comments)    "makes me feel strange"  . Tylenol With Codeine #3 [Acetaminophen-Codeine] Other (See Comments)    "doesn't make me feel right"   Follow-up Information    [name], [name], MD. Schedule an appointment as soon as possible for a visit in 1 week(s).   Specialty:  Internal Medicine Why:  Hospital follow up Contact information: [place] [address] [phone]        Bensimhon, Shaune Pascal, MD. Schedule an appointment as soon as possible for a visit in 5 day(s).   Specialty:  Cardiology Why:  Schedule an appoint with a physician  assistant in 7-10 days. Contact information: [address] [place] [address] [phone]            The results of significant diagnostics from this hospitalization (including imaging, microbiology, ancillary and laboratory) are listed below for reference.    Significant Diagnostic Studies: Dg Chest Port 1 View  Result Date: 09/14/2018 CLINICAL DATA:  Acute onset of left lower chest pain, vomiting and mild shortness of breath. EXAM: PORTABLE CHEST 1 VIEW COMPARISON:  Chest radiograph performed 04/19/2018 FINDINGS: The lungs are well-aerated and clear. There is no evidence of focal opacification, pleural effusion or pneumothorax. The cardiomediastinal silhouette is within normal limits. No acute osseous abnormalities are seen. IMPRESSION: No acute cardiopulmonary process seen. Electronically Signed   By: Garald Balding M.D.   On: 09/14/2018 22:16    Microbiology: No results found for this or any previous visit (from the past 240 hour(s)).   Labs: Basic Metabolic Panel: Recent Labs  Lab 09/14/18 2105 09/14/18 2111 09/16/18 0536  NA 142 143 143  K 5.3* 5.4* 4.8  CL 115* 117* 114*  CO2 19*  --  19*  GLUCOSE 205* 197* 81  BUN 42* 49* 39*  CREATININE 3.77* 3.80* 3.90*  CALCIUM 8.5*  --  7.9*   Liver Function Tests: Recent Labs  Lab 09/14/18 2105  AST 32  ALT 38  ALKPHOS 83  BILITOT 0.8  PROT 6.3*  ALBUMIN 3.2*   Recent Labs  Lab 09/14/18 2153  LIPASE 60*   No results for input(s): AMMONIA in the last 168 hours. CBC: Recent Labs  Lab 09/14/18 2105 09/14/18 2111 09/15/18 0522 09/16/18 0536  WBC 8.8  --  11.4* 8.8  NEUTROABS 5.5  --   --   --   HGB 9.5* 10.9* 8.1* 8.0*  HCT 35.3* 32.0* 28.7* 28.3*  MCV 92.4  --  88.6 89.3  PLT 285  --  258 246   Cardiac Enzymes: Recent Labs  Lab 09/15/18 0115 09/15/18 0522 09/15/18 1147 09/16/18 0536  TROPONINI 1.26* 3.79* 3.34* 2.38*   BNP: BNP (last 3 results) Recent Labs     01/11/18 0357 04/20/18 0112 09/15/18 0115  BNP 61.2 443.9* 131.0*    ProBNP (last 3 results) No results for input(s): PROBNP in the last 8760 hours.  CBG: Recent Labs  Lab 09/15/18 1144 09/15/18 1608 09/15/18 2108 09/16/18 0738 09/16/18 1150  GLUCAP 150* 93 117* 72 89       Signed:  [facility] 09/16/2018, 1:21 PM

## 2018-09-16 NOTE — Discharge Instructions (Signed)

## 2018-09-25 ENCOUNTER — Ambulatory Visit: Payer: Medicare Other | Admitting: Cardiovascular Disease

## 2018-09-25 NOTE — Progress Notes (Deleted)
Cardiology Office Note   Date:  09/25/2018   ID:  Jenna, Brown [birth year], MRN [id number]  PCP:  [name], MD  Cardiologist:   Skeet Latch, MD   No chief complaint on file.     History of Present  Illness: Jenna Brown is a 68 y.o. female with severe 3 vessel CAD not amenable to cath or PCI, chronic systolic and diastolic heart failure , prior CVA, hypertension, diabetes and R AKA here for follow up.  She was initially admitted to [facility] 03/2015 with dyspnea.  Right and left heart cath that admission revealed severe three-vessel coronary artery disease.  She was not felt to be a candidate for CABG due to comorbidities and poor targets.  She was treated for cardiogenic shock with milrinone diuresed.  She was again admitted 12/2016 with hypertensive emergency and acute renal failure.  Troponin was elevated to 4.6, which was thought to be due to demand ischemia.  She was again admitted 03/2017 with NSTEMI in the setting of not taking ticagrelor medical management was recommended given her chronic kidney disease.  Her long-acting nitrates were increased.  She was again admitted 12/2017 with NSTEMI and chest pain.  Troponin peaked at 10.5 and she was medically managed.  She was again turned down by surgery.  She had a repeat NSTEMI 04/2018 that was medically managed.  She saw Dr. Haroldine Laws 07/2018 and was stable.  LVEF on echo 07/2018 was 45-50% with hypokinesis of the apical inferior lateral, inferior, inferoseptal, and apical myocardium.  Shew as admitted 08/2018 with NSTEMI.  Troponin peaked at 3.79 and she was treated with IV heparin and nitroglycerin.  Imdur was increased to 220mg .  That hospitalization was complicated by hypertensive urgency with BP as high as 192/108.     Past Medical History:  Diagnosis Date  . Anemia   . Anxiety    takes Xanax prn  . Arthritis   . CKD (chronic kidney disease), stage IV ([place])   . Coronary artery disease 2016   cath w/ 90% LAD,  95%CFX, 80% OM 2, 60% RCA, not CABG candidate, rx medically  . GERD (gastroesophageal reflux disease)   . H/O hiatal hernia   . Headache(784.0)    when b/p is elevated  . Hemorrhoids   . Hx of AKA (above knee amputation), right ([place])   . Hyperlipidemia    takes Simvastatin daily  . Hypertension    takes Amlodipine/HCTZ/Losartan daily  . Migraines   . Myocardial infarction ([place]) 1990's  . NSTEMI (non-ST elevated myocardial infarction) ([place])   . Peripheral vascular disease ([place])   . PONV (postoperative nausea and vomiting)   . Stroke [facility])    TIA history  . Type II diabetes mellitus ([facility])    on Lantus  . Vertigo    takes Ativert prn    Past Surgical History:  Procedure Laterality Date  . ABDOMINAL AORTAGRAM N/A 02/29/2012   Procedure: ABDOMINAL Maxcine Ham;  Surgeon: [name], MD;  Location: [facility] CATH LAB;  Service: Cardiovascular;  Laterality: N/A;  . AMPUTATION  05/10/2012   Procedure: AMPUTATION ABOVE KNEE;  Surgeon: [name], MD;  Location: [facility] OR;  Service: Vascular;  Laterality: Right;   open right groin wound noted  . aortogram  02/2012  . CARDIAC CATHETERIZATION N/A 04/13/2015   Procedure: Right/Left Heart Cath and Coronary Angiography;  Surgeon: [name], MD;  Location: [facility] CV LAB;  Service: Cardiovascular;  Laterality: N/A;  . CLEFT  PALATE REPAIR     several  . COLONOSCOPY    . DILATION AND CURETTAGE OF UTERUS    . FEMORAL-POPLITEAL BYPASS GRAFT  04/05/2012   Procedure: BYPASS GRAFT FEMORAL-POPLITEAL ARTERY;  Surgeon: [name], MD;  Location: [place];  Service: Vascular;  Laterality: Right;  REVISION  . FEMORAL-TIBIAL BYPASS GRAFT  03/29/2012   Procedure: BYPASS GRAFT FEMORAL-TIBIAL ARTERY;  Surgeon: [name], MD;  Location: [facility] OR;  Service: Vascular;  Laterality: Right;  Right femoral to Posterior Tibialis with composite graft of 72mm x 80 cm ringed gortex graft and saphenous vein  ,intraoperative arteriogram  . FEMOROPOPLITEAL  THROMBECTOMY / EMBOLECTOMY  05/09/2012  . MYOMECTOMY    . PR VEIN BYPASS GRAFT,AORTO-FEM-POP  05/27/11   Right SFA-Below knee Pop BP  . TEE WITHOUT CARDIOVERSION N/A 01/03/2017   Procedure: TRANSESOPHAGEAL ECHOCARDIOGRAM (TEE);  Surgeon: Skeet Latch, MD;  Location: [facility] ENDOSCOPY;  Service: Cardiovascular;  Laterality: N/A;  . TONSILLECTOMY    . TUBAL LIGATION  ~ 1990     Current Outpatient Medications  Medication Sig Dispense Refill  . acetaminophen (TYLENOL) 500 MG tablet Take 500 mg by mouth every 6 (six) hours as needed for mild pain.    Marland Kitchen ALPRAZolam (XANAX) 0.25 MG tablet Take 1 tablet (0.25 mg total) by mouth 3 (three) times daily as needed for anxiety. (Patient taking differently: Take 0.25 mg by mouth daily as needed for anxiety. ) 12 tablet 0  . amLODipine (NORVASC) 5 MG tablet Take 1 tablet (5 mg total) by mouth daily. (Patient not taking: Reported on 09/14/2018) 30 tablet 1  . aspirin 81 MG chewable tablet Chew 81 mg by mouth daily.    Marland Kitchen atorvastatin (LIPITOR) 80 MG tablet Take 80 mg by mouth daily.     . Cholecalciferol (VITAMIN D-3) 25 MCG (1000 UT) CAPS Take 1,000 Units by mouth daily.    Marland Kitchen doxazosin (CARDURA) 1 MG tablet Take 1 tablet (1 mg total) by mouth at bedtime. 30 tablet 0  . ferrous sulfate 325 (65 FE) MG tablet Take 325 mg by mouth daily with breakfast.     . furosemide (LASIX) 40 MG tablet Take 1 tablet (40 mg total) by mouth daily as needed. (Patient taking differently: Take 40 mg by mouth daily as needed for fluid. ) 30 tablet 11  . hydrALAZINE (APRESOLINE) 50 MG tablet Take 50 mg by mouth 3 (three) times daily.    . insulin glargine (LANTUS) 100 UNIT/ML injection Inject 0.25 mLs (25 Units total) into the skin 2 (two) times daily. (Patient taking differently: Inject 25 Units into the skin at bedtime. ) 10 mL 11  . isosorbide mononitrate (IMDUR) 120 MG 24 hr tablet Take 1 tablet (120 mg total) by mouth daily. 30 tablet 0  . losartan (COZAAR) 100 MG tablet Take 100 mg  by mouth daily.    . nitroGLYCERIN (NITROSTAT) 0.4 MG SL tablet Place 1 tablet (0.4 mg total) under the tongue every 5 (five) minutes x 3 doses as needed for chest pain. 25 tablet 3  . ticagrelor (BRILINTA) 90 MG TABS tablet Take 1 tablet (90 mg total) by mouth 2 (two) times daily. 60 tablet 11   No current facility-administered medications for this visit.     Allergies:   Plavix [clopidogrel bisulfate]; Codeine; and Tylenol with codeine #3 [acetaminophen-codeine]    Social History:  The patient  reports that she quit smoking about 6 years ago. Her smoking use included cigarettes. She has a 10.00 pack-year  smoking history. She has never used smokeless tobacco. She reports that she does not drink alcohol or use drugs.   Family History:  The patient's ***family history includes Cancer in her mother; Diabetes in her father.    ROS:  Please see the history of present illness.   Otherwise, review of systems are positive for {NONE DEFAULTED:18576::"none"}.   All other systems are reviewed and negative.    PHYSICAL EXAM: VS:  There were no vitals taken for this visit. , BMI There is no height or weight on file to calculate BMI. GENERAL:  Well appearing HEENT:  Pupils equal round and reactive, fundi not visualized, oral mucosa unremarkable NECK:  No jugular venous distention, waveform within normal limits, carotid upstroke brisk and symmetric, no bruits, no thyromegaly LYMPHATICS:  No cervical adenopathy LUNGS:  Clear to auscultation bilaterally HEART:  RRR.  PMI not displaced or sustained,S1 and S2 within normal limits, no S3, no S4, no clicks, no rubs, *** murmurs ABD:  Flat, positive bowel sounds normal in frequency in pitch, no bruits, no rebound, no guarding, no midline pulsatile mass, no hepatomegaly, no splenomegaly EXT:  2 plus pulses throughout, no edema, no cyanosis no clubbing SKIN:  No rashes no nodules NEURO:  Cranial nerves II through XII grossly intact, motor grossly intact  throughout PSYCH:  Cognitively intact, oriented to person place and time    EKG:  EKG {ACTION; IS/IS XIH:[id number]} ordered today. The ekg ordered today demonstrates ***  Echo 07/2018: Study Conclusions  - Left ventricle: The cavity size was normal. There was mild focal   basal hypertrophy of the septum. Systolic function was mildly   reduced. The estimated ejection fraction was in the range of 45%   to 50%. Hypokinesis of the apicalinferolateral, inferior,   inferoseptal, and apical myocardium. - Aortic valve: Trileaflet; mildly thickened, mildly calcified   leaflets. Left coronary cusp mobility was moderately restricted. - Mitral valve: Calcified annulus. Mildly thickened leaflets .   There was trivial regurgitation. - Right ventricle: The cavity size was mildly dilated. Wall   thickness was normal. Systolic function was low normal. - Atrial septum: No defect or patent foramen ovale was identified. - Tricuspid valve: There was no significant regurgitation. - Pulmonic valve: There was trivial regurgitation.  Impressions:  - EF using wall tracking approximately 45%. Hypokinesis of distal   inferior, inferolateral, inferoseptal, and apical walls. Wall   motion visually appears similar to prior. Thickened LCC of aortic   valve appears similar to prior.  LHC 03/2015:  Prox LAD to Mid LAD lesion, 90% stenosed.  Ost Cx to Mid Cx lesion, 95% stenosed.  Ost 2nd Mrg to 2nd Mrg lesion, 80% stenosed.  Mid Cx lesion, 90% stenosed.  Prox RCA lesion, 60% stenosed.  Mid RCA lesion, 50% stenosed.       Recent Labs: 01/11/2018: TSH 1.778 09/14/2018: ALT 38 09/15/2018: B Natriuretic Peptide 131.0 09/16/2018: BUN 39; Creatinine, Ser 3.90; Hemoglobin 8.0; Platelets 246; Potassium 4.8; Sodium 143    Lipid Panel    Component Value Date/Time   CHOL 162 09/15/2018 0522   TRIG 51 09/15/2018 0522   HDL 45 09/15/2018 0522   CHOLHDL 3.6 09/15/2018 0522   VLDL 10 09/15/2018 0522     LDLCALC 107 (H) 09/15/2018 0522      Wt Readings from Last 3 Encounters:  09/16/18 160 lb 4.8 oz (72.7 kg)  07/12/18 159 lb (72.1 kg)  07/02/18 160 lb (72.6 kg)      ASSESSMENT AND PLAN:  ***  Current medicines are reviewed at length with the patient today.  The patient {ACTIONS; HAS/DOES NOT HAVE:19233} concerns regarding medicines.  The following changes have been made:  {PLAN; NO CHANGE:13088:s}  Labs/ tests ordered today include: *** No orders of the defined types were placed in this encounter.    Disposition:   FU with ***     Signed, Tristy Udovich C. Oval Linsey, MD, [facility]  09/25/2018 1:04 PM    [place]

## 2018-10-04 DIAGNOSIS — D638 Anemia in other chronic diseases classified elsewhere: Secondary | ICD-10-CM | POA: Diagnosis not present

## 2018-10-04 DIAGNOSIS — I119 Hypertensive heart disease without heart failure: Secondary | ICD-10-CM | POA: Diagnosis not present

## 2018-10-04 DIAGNOSIS — Z89611 Acquired absence of right leg above knee: Secondary | ICD-10-CM | POA: Diagnosis not present

## 2018-10-04 DIAGNOSIS — E119 Type 2 diabetes mellitus without complications: Secondary | ICD-10-CM | POA: Diagnosis not present

## 2018-10-04 DIAGNOSIS — E559 Vitamin D deficiency, unspecified: Secondary | ICD-10-CM | POA: Diagnosis not present

## 2018-10-04 DIAGNOSIS — R1013 Epigastric pain: Secondary | ICD-10-CM | POA: Diagnosis not present

## 2018-10-04 DIAGNOSIS — I10 Essential (primary) hypertension: Secondary | ICD-10-CM | POA: Diagnosis not present

## 2018-10-04 DIAGNOSIS — E538 Deficiency of other specified B group vitamins: Secondary | ICD-10-CM | POA: Diagnosis not present

## 2018-10-04 DIAGNOSIS — N183 Chronic kidney disease, stage 3 (moderate): Secondary | ICD-10-CM | POA: Diagnosis not present

## 2018-10-04 DIAGNOSIS — E785 Hyperlipidemia, unspecified: Secondary | ICD-10-CM | POA: Diagnosis not present

## 2018-10-10 DIAGNOSIS — D631 Anemia in chronic kidney disease: Secondary | ICD-10-CM | POA: Diagnosis not present

## 2018-10-10 DIAGNOSIS — D649 Anemia, unspecified: Secondary | ICD-10-CM | POA: Diagnosis not present

## 2018-10-10 DIAGNOSIS — N189 Chronic kidney disease, unspecified: Secondary | ICD-10-CM | POA: Diagnosis not present

## 2018-11-05 DIAGNOSIS — D649 Anemia, unspecified: Secondary | ICD-10-CM | POA: Diagnosis not present

## 2018-11-05 DIAGNOSIS — E119 Type 2 diabetes mellitus without complications: Secondary | ICD-10-CM | POA: Diagnosis not present

## 2018-11-05 DIAGNOSIS — M109 Gout, unspecified: Secondary | ICD-10-CM | POA: Diagnosis not present

## 2018-11-05 DIAGNOSIS — N184 Chronic kidney disease, stage 4 (severe): Secondary | ICD-10-CM | POA: Diagnosis not present

## 2018-11-05 DIAGNOSIS — D631 Anemia in chronic kidney disease: Secondary | ICD-10-CM | POA: Diagnosis not present

## 2018-11-05 DIAGNOSIS — R809 Proteinuria, unspecified: Secondary | ICD-10-CM | POA: Diagnosis not present

## 2018-11-05 DIAGNOSIS — E7800 Pure hypercholesterolemia, unspecified: Secondary | ICD-10-CM | POA: Diagnosis not present

## 2018-11-05 DIAGNOSIS — I10 Essential (primary) hypertension: Secondary | ICD-10-CM | POA: Diagnosis not present

## 2018-11-05 DIAGNOSIS — N189 Chronic kidney disease, unspecified: Secondary | ICD-10-CM | POA: Diagnosis not present

## 2018-11-16 ENCOUNTER — Encounter: Payer: Self-pay | Admitting: Cardiovascular Disease

## 2018-11-16 ENCOUNTER — Ambulatory Visit (INDEPENDENT_AMBULATORY_CARE_PROVIDER_SITE_OTHER): Payer: Medicare Other | Admitting: Cardiovascular Disease

## 2018-11-16 VITALS — BP 162/88 | HR 89 | Ht 64.0 in | Wt 154.8 lb

## 2018-11-16 DIAGNOSIS — I10 Essential (primary) hypertension: Secondary | ICD-10-CM

## 2018-11-16 DIAGNOSIS — I2510 Atherosclerotic heart disease of native coronary artery without angina pectoris: Secondary | ICD-10-CM

## 2018-11-16 DIAGNOSIS — I5022 Chronic systolic (congestive) heart failure: Secondary | ICD-10-CM

## 2018-11-16 DIAGNOSIS — E7800 Pure hypercholesterolemia, unspecified: Secondary | ICD-10-CM | POA: Diagnosis not present

## 2018-11-16 DIAGNOSIS — I2583 Coronary atherosclerosis due to lipid rich plaque: Secondary | ICD-10-CM | POA: Diagnosis not present

## 2018-11-16 DIAGNOSIS — I739 Peripheral vascular disease, unspecified: Secondary | ICD-10-CM | POA: Diagnosis not present

## 2018-11-16 DIAGNOSIS — N184 Chronic kidney disease, stage 4 (severe): Secondary | ICD-10-CM

## 2018-11-16 MED ORDER — HYDRALAZINE HCL 100 MG PO TABS: 100.0000 mg | ORAL_TABLET | Freq: Three times a day (TID) | ORAL | 5 refills | Status: DC

## 2018-11-16 MED ORDER — AMLODIPINE BESYLATE 10 MG PO TABS: 10.0000 mg | ORAL_TABLET | Freq: Every day | ORAL | 5 refills | Status: DC

## 2018-11-16 NOTE — Patient Instructions (Addendum)
Medication Instructions:  INCREASE YOUR HYDRALAZINE TO 100 MG THREE TIMES A DAY. TAKE 2 OF YOUR 50 MG TABLETS UNTIL YOU RUN OUT. A NEW RX FOR THE 100 MG TABLETS HAS BEEN SENT TO YOUR PHARMACY   INCREASE YOUR AMLODIPINE TO 10 MG DAILY. TAKE 2 OF YOUR 5 MG TABLETS UNTIL YOU RUN OUT. A NEW RX FOR THE 10 MG TABLETS HAVE BEEN SENT TO YOUR PHARMACY   If you need a refill on your cardiac medications before your next appointment, please call your pharmacy.   Lab work: NONE   Testing/Procedures: NONE  Follow-Up: At [facility], you and your health needs are our priority.  As part of our continuing mission to provide you with exceptional heart care, we have created designated Provider Care Teams.  These Care Teams include your primary Cardiologist (physician) and Advanced Practice Providers (APPs -  Physician Assistants and Nurse Practitioners) who all work together to provide you with the care you need, when you need it. You will need a follow up appointment in 6 months.  Please call our office 2 months in advance to schedule this appointment.  You may see DR [facility] or one of the following Advanced Practice Providers on your designated Care Team:   Kerin Ransom, PA-C Roby Lofts, [place] . Sande Rives, PA-C  Your physician recommends that you schedule a follow-up appointment in: 1 MONTH WITH PHARM D FOR BLOOD PRESSURE AND TO DISCUSS CHOLESTEROL

## 2018-11-16 NOTE — Progress Notes (Signed)
Cardiology Office Note   Date:  11/16/2018   ID:  Jenna Brown, Jenna Brown 25-Feb-1951, MRN [id number]  PCP:  [name], MD  Cardiologist:   Skeet Latch, MD   No chief complaint on file.     History of Present Illness: Jenna Brown is a 68 y.o. female with chronic systolic and diastolic heart failure, severe CAD, not a candidate for CABG, s/p R AKA, hypertension, DM here for follow up.  Jenna Brown was previously a patient of Dr. Haroldine Laws.  She was initially admitted 03/2015 with dyspnea.  She underwent left and right heart catheterization that admission and was found to have severe three-vessel disease.  However she was not a candidate for CABG due to comorbidities and poor targets.  She required short-term milrinone for cardiogenic shock.  She was diuresed and able to get off of milrinone.  She was then admitted 12/2016 with hypertensive emergency and acute renal failure.  Troponin was elevated to 4.6.  This was felt to be due to demand ischemia.  She was treated with nitroglycerin and a heparin drip.  She was then again admitted 03/2017 with NSTEMI in the setting of not taking ticagrelor as prescribed.  She was medically managed given her chronic kidney disease.  She was again admitted with NSTEMI 12/2017 with a peak troponin of 10.5.  She was again medically managed given CKD IV-V.  She was also evaluated by surgery and still considered not to be a surgical candidate.  Most recently she was admitted 04/2018 with chest pain and NSTEMI.  Troponin peaked with 2.2.  She is evaluated by Dr. Martinique who recommended medical management and not pursuing cardiac catheterization.  She last followed up in the heart failure clinic 07/2018.  At that time she was feeling well.  She had an echo 07/2018 that revealed LVEF 45 to 50% with hypokinesis in the apical inferolateral, inferior, inferoseptal, and apical myocardium.  Jenna Brown has been feeling generally well.  She has chest pain occasionally and  takes a nitroglycerin every 4-6 weeks.  Her symptoms improve with 1-2 tablets.  She has no associated shortness of breath and usually occurs when she is rushed or excited.  Her BP at home has been in the 140s-160s.  She has occasional LE edema but denies orthopnea or PND.  She isn't able to get much exercise but is working on her diet by eating a lot of fruit and vegetables and limiting fried foods.   Past Medical History:  Diagnosis Date  . Anemia   . Anxiety    takes Xanax prn  . Arthritis   . CKD (chronic kidney disease), stage IV ([place])   . Coronary artery disease 2016   cath w/ 90% LAD, 95%CFX, 80% OM 2, 60% RCA, not CABG candidate, rx medically  . GERD (gastroesophageal reflux disease)   . H/O hiatal hernia   . Headache(784.0)    when b/p is elevated  . Hemorrhoids   . Hx of AKA (above knee amputation), right ([place])   . Hyperlipidemia    takes Simvastatin daily  . Hypertension    takes Amlodipine/HCTZ/Losartan daily  . Migraines   . Myocardial infarction ([place]) 1990's  . NSTEMI (non-ST elevated myocardial infarction) ([place])   . Peripheral vascular disease ([place])   . PONV (postoperative nausea and vomiting)   . Stroke [facility])    TIA history  . Type II diabetes mellitus (HCC)    on Lantus  . Vertigo    takes  Ativert prn    Past Surgical History:  Procedure Laterality Date  . ABDOMINAL AORTAGRAM N/A 02/29/2012   Procedure: ABDOMINAL Maxcine Ham;  Surgeon: [name], MD;  Location: [facility] CATH LAB;  Service: Cardiovascular;  Laterality: N/A;  . AMPUTATION  05/10/2012   Procedure: AMPUTATION ABOVE KNEE;  Surgeon: [name], MD;  Location: [place] OR;  Service: Vascular;  Laterality: Right;   open right groin wound noted  . aortogram  02/2012  . CARDIAC CATHETERIZATION N/A 04/13/2015   Procedure: Right/Left Heart Cath and Coronary Angiography;  Surgeon: [name], MD;  Location: [place] CV LAB;  Service: Cardiovascular;  Laterality: N/A;  . CLEFT PALATE REPAIR      several  . COLONOSCOPY    . DILATION AND CURETTAGE OF UTERUS    . FEMORAL-POPLITEAL BYPASS GRAFT  04/05/2012   Procedure: BYPASS GRAFT FEMORAL-POPLITEAL ARTERY;  Surgeon: [name], MD;  Location: [place];  Service: Vascular;  Laterality: Right;  REVISION  . FEMORAL-TIBIAL BYPASS GRAFT  03/29/2012   Procedure: BYPASS GRAFT FEMORAL-TIBIAL ARTERY;  Surgeon: [name], MD;  Location: [place] OR;  Service: Vascular;  Laterality: Right;  Right femoral to Posterior Tibialis with composite graft of 59mm x 80 cm ringed gortex graft and saphenous vein  ,intraoperative arteriogram  . FEMOROPOPLITEAL THROMBECTOMY / EMBOLECTOMY  05/09/2012  . MYOMECTOMY    . PR VEIN BYPASS GRAFT,AORTO-FEM-POP  05/27/11   Right SFA-Below knee Pop BP  . TEE WITHOUT CARDIOVERSION N/A 01/03/2017   Procedure: TRANSESOPHAGEAL ECHOCARDIOGRAM (TEE);  Surgeon: Skeet Latch, MD;  Location: [facility] ENDOSCOPY;  Service: Cardiovascular;  Laterality: N/A;  . TONSILLECTOMY    . TUBAL LIGATION  ~ 1990     Current Outpatient Medications  Medication Sig Dispense Refill  . acetaminophen (TYLENOL) 500 MG tablet Take 500 mg by mouth every 6 (six) hours as needed for mild pain.    Marland Kitchen ALPRAZolam (XANAX) 0.25 MG tablet Take 1 tablet (0.25 mg total) by mouth 3 (three) times daily as needed for anxiety. (Patient taking differently: Take 0.25 mg by mouth daily as needed for anxiety. ) 12 tablet 0  . amLODipine (NORVASC) 5 MG tablet Take 1 tablet (5 mg total) by mouth daily. 30 tablet 1  . aspirin 81 MG chewable tablet Chew 81 mg by mouth daily.    Marland Kitchen atorvastatin (LIPITOR) 80 MG tablet Take 80 mg by mouth daily.     . Cholecalciferol (VITAMIN D-3) 25 MCG (1000 UT) CAPS Take 1,000 Units by mouth daily.    Marland Kitchen doxazosin (CARDURA) 1 MG tablet Take 1 tablet (1 mg total) by mouth at bedtime. 30 tablet 0  . ferrous sulfate 325 (65 FE) MG tablet Take 325 mg by mouth daily with breakfast.     . furosemide (LASIX) 40 MG tablet Take 1 tablet (40 mg total) by  mouth daily as needed. (Patient taking differently: Take 40 mg by mouth daily as needed for fluid. ) 30 tablet 11  . hydrALAZINE (APRESOLINE) 50 MG tablet Take 50 mg by mouth 3 (three) times daily.    . insulin glargine (LANTUS) 100 UNIT/ML injection Inject 0.25 mLs (25 Units total) into the skin 2 (two) times daily. (Patient taking differently: Inject 25 Units into the skin at bedtime. ) 10 mL 11  . isosorbide mononitrate (IMDUR) 120 MG 24 hr tablet Take 1 tablet (120 mg total) by mouth daily. 30 tablet 0  . losartan (COZAAR) 100 MG tablet Take 100 mg by mouth daily.    Marland Kitchen  nitroGLYCERIN (NITROSTAT) 0.4 MG SL tablet Place 1 tablet (0.4 mg total) under the tongue every 5 (five) minutes x 3 doses as needed for chest pain. 25 tablet 3  . ticagrelor (BRILINTA) 90 MG TABS tablet Take 1 tablet (90 mg total) by mouth 2 (two) times daily. 60 tablet 11   No current facility-administered medications for this visit.     Allergies:   Plavix [clopidogrel bisulfate]; Codeine; and Tylenol with codeine #3 [acetaminophen-codeine]    Social History:  The patient  reports that she quit smoking about 6 years ago. Her smoking use included cigarettes. She has a 10.00 pack-year smoking history. She has never used smokeless tobacco. She reports that she does not drink alcohol or use drugs.   Family History:  The patient's family history includes Cancer in her mother; Diabetes in her father.    ROS:  Please see the history of present illness.   Otherwise, review of systems are positive for none.   All other systems are reviewed and negative.    PHYSICAL EXAM: VS:  BP (!) 162/88   Pulse 89   Ht 5\' 4"  (1.626 m)   Wt 154 lb 12.8 oz (70.2 kg)   SpO2 98%   BMI 26.57 kg/m  , BMI Body mass index is 26.57 kg/m. GENERAL:  Well appearing HEENT:  Pupils equal round and reactive, fundi not visualized, oral mucosa unremarkable NECK:  No jugular venous distention, waveform within normal limits, carotid upstroke brisk and  symmetric, no bruits, no thyromegaly LYMPHATICS:  No cervical adenopathy LUNGS:  Clear to auscultation bilaterally HEART:  RRR.  PMI not displaced or sustained,S1 and S2 within normal limits, no S3, no S4, no clicks, no rubs, no murmurs ABD:  Flat, positive bowel sounds normal in frequency in pitch, no bruits, no rebound, no guarding, no midline pulsatile mass, no hepatomegaly, no splenomegaly EXT:  2 plus pulses throughout, 2+ edema, no cyanosis no clubbing SKIN:  No rashes no nodules NEURO:  Cranial nerves II through XII grossly intact, motor grossly intact throughout PSYCH:  Cognitively intact, oriented to person place and time   EKG:  EKG is ordered today. The ekg ordered 11/16/18 demonstrates sinus rhythm.  Rate 91 bpm.   Prior inferior infarct.  Diffuse T wave abnormality  TEE 01/03/17  45%, trivial MR and TR, no LA/LAA thrombus or mass, negative bubble study.   Echo 03/2017 Left ventricle: LVEF is approximately 45 to 50% with mild hypokinesis of the distal inferior/inferoseptal and apical walls. The cavity size was normal. Wall thickness was increased in a pattern of mild LVH. Doppler parameters are consistent with abnormal left ventricular relaxation (grade 1 diastolic dysfunction). - Aortic valve: AV is thickened, calcified In the parasternal view there is an nodular echo bright segment (18 x 7 mm) May represent nodular thickening of valve Not convincing for discrete mass. It appears to be present on TTE in April 2018 TEE after that showed no abnormalitiy to AV. - Mitral valve: Calcified annulus. Mildly thickened leaflets   Echo 07/2018: Study Conclusions  - Left ventricle: The cavity size was normal. There was mild focal   basal hypertrophy of the septum. Systolic function was mildly   reduced. The estimated ejection fraction was in the range of 45%   to 50%. Hypokinesis of the apicalinferolateral, inferior,   inferoseptal, and apical myocardium. -  Aortic valve: Trileaflet; mildly thickened, mildly calcified   leaflets. Left coronary cusp mobility was moderately restricted. - Mitral valve: Calcified annulus. Mildly thickened  leaflets .   There was trivial regurgitation. - Right ventricle: The cavity size was mildly dilated. Wall   thickness was normal. Systolic function was low normal. - Atrial septum: No defect or patent foramen ovale was identified. - Tricuspid valve: There was no significant regurgitation. - Pulmonic valve: There was trivial regurgitation.  Impressions:  - EF using wall tracking approximately 45%. Hypokinesis of distal   inferior, inferolateral, inferoseptal, and apical walls. Wall   motion visually appears similar to prior. Thickened LCC of aortic   valve appears similar to prior.  RHC/LHC 04/14/15  Prox LAD to Mid LAD lesion, 90% stenosed.  Ost Cx to Mid Cx lesion, 95% stenosed.  Ost 2nd Mrg to 2nd Mrg lesion, 80% stenosed.  Mid Cx lesion, 90% stenosed.  Prox RCA lesion, 60% stenosed.  Mid RCA lesion, 50% stenosed.  Recent Labs: 01/11/2018: TSH 1.778 09/14/2018: ALT 38 09/15/2018: B Natriuretic Peptide 131.0 09/16/2018: BUN 39; Creatinine, Ser 3.90; Hemoglobin 8.0; Platelets 246; Potassium 4.8; Sodium 143    Lipid Panel    Component Value Date/Time   CHOL 162 09/15/2018 0522   TRIG 51 09/15/2018 0522   HDL 45 09/15/2018 0522   CHOLHDL 3.6 09/15/2018 0522   VLDL 10 09/15/2018 0522   LDLCALC 107 (H) 09/15/2018 0522      Wt Readings from Last 3 Encounters:  11/16/18 154 lb 12.8 oz (70.2 kg)  09/16/18 160 lb 4.8 oz (72.7 kg)  07/12/18 159 lb (72.1 kg)      ASSESSMENT AND PLAN:  # Obstructive CAD: # Recurrent NSTEMI: # Hyperlipidemia: 3 vessel CAD that is medically managed.  She is not a good candidate for CABG or PCI.  She has chronic stable angina that is managed with 1-2 nitroglycerin every month.  Continue aspirin, ticagrelor, amlodipine, atorvastatin, and Imdur.  #  Hypertension:  BP poorly controlled.  We will increase hydrlazine to 100mg  tid.  Continue amlodipine, doxazosin, losartan.  It appears that carvedilol is not on her medication list, thogh she should have been discharged on this medication.  We will clafify.  CKD IV-V:  Creatinine stable.   # Prior CVA: Continue aspirin and statin.  # Chronic systolic and diastolic heart failure: LVEF 45-50%.  Continue lasix, hydralazine, Imdur and clarify carvedilol as above.   Current medicines are reviewed at length with the patient today.  The patient does not have concerns regarding medicines.  The following changes have been made:  Increase hydralazine  Labs/ tests ordered today include:  No orders of the defined types were placed in this encounter.    Disposition:   FU with Roxi Hlavaty C. Oval Linsey, MD, [facility] in 6 months.  PharmD in 1 month.    Signed, Kahleah Crass C. Oval Linsey, MD, [facility]  11/16/2018 10:48 AM    [place]

## 2018-11-26 ENCOUNTER — Encounter: Payer: Self-pay | Admitting: Cardiovascular Disease

## 2018-11-27 ENCOUNTER — Telehealth: Payer: Self-pay | Admitting: *Deleted

## 2018-11-27 NOTE — Telephone Encounter (Signed)
-----   Message from Skeet Latch, MD sent at 11/26/2018  6:38 PM EDT ----- Regarding: medication clarification Jenna Blake,  Ms. Gherardi was supposed to be discharged on carvedilol 18.75mg  bid but it isn't on her medication list.  Would you mind verifying that she  is taking it?  Thanks! Tiffany

## 2018-11-28 MED ORDER — CARVEDILOL 12.5 MG PO TABS: ORAL_TABLET | ORAL | 3 refills | Status: DC

## 2018-11-28 NOTE — Telephone Encounter (Signed)
Advised patient and she was very concerned this was d/c because of her kidneys. Tried to reassure patient however she wanted for Dr Oval Linsey to review her last visit notes from Dr Joesph July her nephrologist. Dr Oval Linsey reviewed and would still like patient to resume the Carvedilol 12.5 mg 1 ane 1/2 tablets daily. Patient verbalized understanding.

## 2018-12-07 DIAGNOSIS — E162 Hypoglycemia, unspecified: Secondary | ICD-10-CM | POA: Diagnosis not present

## 2018-12-07 DIAGNOSIS — E1165 Type 2 diabetes mellitus with hyperglycemia: Secondary | ICD-10-CM | POA: Diagnosis not present

## 2018-12-07 DIAGNOSIS — E161 Other hypoglycemia: Secondary | ICD-10-CM | POA: Diagnosis not present

## 2018-12-07 DIAGNOSIS — R001 Bradycardia, unspecified: Secondary | ICD-10-CM | POA: Diagnosis not present

## 2018-12-07 DIAGNOSIS — R4781 Slurred speech: Secondary | ICD-10-CM | POA: Diagnosis not present

## 2018-12-17 ENCOUNTER — Ambulatory Visit: Payer: Medicare Other

## 2019-01-21 ENCOUNTER — Other Ambulatory Visit (HOSPITAL_COMMUNITY): Payer: Self-pay | Admitting: Internal Medicine

## 2019-01-21 DIAGNOSIS — D638 Anemia in other chronic diseases classified elsewhere: Secondary | ICD-10-CM | POA: Diagnosis not present

## 2019-01-21 DIAGNOSIS — N183 Chronic kidney disease, stage 3 (moderate): Secondary | ICD-10-CM | POA: Diagnosis not present

## 2019-01-21 DIAGNOSIS — R1013 Epigastric pain: Secondary | ICD-10-CM | POA: Diagnosis not present

## 2019-01-21 DIAGNOSIS — I10 Essential (primary) hypertension: Secondary | ICD-10-CM | POA: Diagnosis not present

## 2019-01-21 DIAGNOSIS — E559 Vitamin D deficiency, unspecified: Secondary | ICD-10-CM | POA: Diagnosis not present

## 2019-01-21 DIAGNOSIS — I119 Hypertensive heart disease without heart failure: Secondary | ICD-10-CM | POA: Diagnosis not present

## 2019-01-21 DIAGNOSIS — Z89611 Acquired absence of right leg above knee: Secondary | ICD-10-CM | POA: Diagnosis not present

## 2019-01-21 DIAGNOSIS — E119 Type 2 diabetes mellitus without complications: Secondary | ICD-10-CM | POA: Diagnosis not present

## 2019-01-21 DIAGNOSIS — E538 Deficiency of other specified B group vitamins: Secondary | ICD-10-CM | POA: Diagnosis not present

## 2019-02-04 DIAGNOSIS — I119 Hypertensive heart disease without heart failure: Secondary | ICD-10-CM | POA: Diagnosis not present

## 2019-02-04 DIAGNOSIS — I10 Essential (primary) hypertension: Secondary | ICD-10-CM | POA: Diagnosis not present

## 2019-02-04 DIAGNOSIS — F419 Anxiety disorder, unspecified: Secondary | ICD-10-CM | POA: Diagnosis not present

## 2019-02-04 DIAGNOSIS — E119 Type 2 diabetes mellitus without complications: Secondary | ICD-10-CM | POA: Diagnosis not present

## 2019-02-04 DIAGNOSIS — D638 Anemia in other chronic diseases classified elsewhere: Secondary | ICD-10-CM | POA: Diagnosis not present

## 2019-02-04 DIAGNOSIS — R1013 Epigastric pain: Secondary | ICD-10-CM | POA: Diagnosis not present

## 2019-02-04 DIAGNOSIS — Z89611 Acquired absence of right leg above knee: Secondary | ICD-10-CM | POA: Diagnosis not present

## 2019-02-04 DIAGNOSIS — N183 Chronic kidney disease, stage 3 (moderate): Secondary | ICD-10-CM | POA: Diagnosis not present

## 2019-02-04 DIAGNOSIS — E538 Deficiency of other specified B group vitamins: Secondary | ICD-10-CM | POA: Diagnosis not present

## 2019-02-04 DIAGNOSIS — E559 Vitamin D deficiency, unspecified: Secondary | ICD-10-CM | POA: Diagnosis not present

## 2019-02-12 DIAGNOSIS — D649 Anemia, unspecified: Secondary | ICD-10-CM | POA: Diagnosis not present

## 2019-02-12 DIAGNOSIS — N189 Chronic kidney disease, unspecified: Secondary | ICD-10-CM | POA: Diagnosis not present

## 2019-02-12 DIAGNOSIS — R309 Painful micturition, unspecified: Secondary | ICD-10-CM | POA: Diagnosis not present

## 2019-02-12 DIAGNOSIS — I10 Essential (primary) hypertension: Secondary | ICD-10-CM | POA: Diagnosis not present

## 2019-02-12 DIAGNOSIS — E119 Type 2 diabetes mellitus without complications: Secondary | ICD-10-CM | POA: Diagnosis not present

## 2019-02-12 DIAGNOSIS — N184 Chronic kidney disease, stage 4 (severe): Secondary | ICD-10-CM | POA: Diagnosis not present

## 2019-02-22 DIAGNOSIS — Z79899 Other long term (current) drug therapy: Secondary | ICD-10-CM | POA: Diagnosis not present

## 2019-02-22 DIAGNOSIS — N189 Chronic kidney disease, unspecified: Secondary | ICD-10-CM | POA: Diagnosis not present

## 2019-02-22 DIAGNOSIS — D649 Anemia, unspecified: Secondary | ICD-10-CM | POA: Diagnosis not present

## 2019-02-26 ENCOUNTER — Encounter (HOSPITAL_COMMUNITY): Payer: Self-pay

## 2019-02-26 ENCOUNTER — Other Ambulatory Visit: Payer: Self-pay

## 2019-02-26 ENCOUNTER — Inpatient Hospital Stay (HOSPITAL_COMMUNITY)
Admission: EM | Admit: 2019-02-26 | Discharge: 2019-03-06 | DRG: 264 | Disposition: A | Discharge status: Home / Self Care | Payer: Medicare Other | Attending: Internal Medicine | Admitting: Internal Medicine

## 2019-02-26 ENCOUNTER — Emergency Department (HOSPITAL_COMMUNITY): Payer: Medicare Other

## 2019-02-26 DIAGNOSIS — D509 Iron deficiency anemia, unspecified: Secondary | ICD-10-CM | POA: Diagnosis present

## 2019-02-26 DIAGNOSIS — J9601 Acute respiratory failure with hypoxia: Secondary | ICD-10-CM | POA: Diagnosis not present

## 2019-02-26 DIAGNOSIS — E1122 Type 2 diabetes mellitus with diabetic chronic kidney disease: Secondary | ICD-10-CM | POA: Diagnosis present

## 2019-02-26 DIAGNOSIS — D631 Anemia in chronic kidney disease: Secondary | ICD-10-CM | POA: Diagnosis present

## 2019-02-26 DIAGNOSIS — I255 Ischemic cardiomyopathy: Secondary | ICD-10-CM | POA: Diagnosis present

## 2019-02-26 DIAGNOSIS — I25119 Atherosclerotic heart disease of native coronary artery with unspecified angina pectoris: Secondary | ICD-10-CM | POA: Diagnosis not present

## 2019-02-26 DIAGNOSIS — I959 Hypotension, unspecified: Secondary | ICD-10-CM | POA: Diagnosis not present

## 2019-02-26 DIAGNOSIS — J9811 Atelectasis: Secondary | ICD-10-CM | POA: Diagnosis not present

## 2019-02-26 DIAGNOSIS — N171 Acute kidney failure with acute cortical necrosis: Secondary | ICD-10-CM | POA: Diagnosis not present

## 2019-02-26 DIAGNOSIS — R000 Tachycardia, unspecified: Secondary | ICD-10-CM | POA: Diagnosis not present

## 2019-02-26 DIAGNOSIS — R34 Anuria and oliguria: Secondary | ICD-10-CM | POA: Diagnosis not present

## 2019-02-26 DIAGNOSIS — I5033 Acute on chronic diastolic (congestive) heart failure: Secondary | ICD-10-CM | POA: Diagnosis not present

## 2019-02-26 DIAGNOSIS — Z1159 Encounter for screening for other viral diseases: Secondary | ICD-10-CM

## 2019-02-26 DIAGNOSIS — I5021 Acute systolic (congestive) heart failure: Secondary | ICD-10-CM | POA: Diagnosis not present

## 2019-02-26 DIAGNOSIS — N185 Chronic kidney disease, stage 5: Secondary | ICD-10-CM | POA: Diagnosis not present

## 2019-02-26 DIAGNOSIS — F419 Anxiety disorder, unspecified: Secondary | ICD-10-CM | POA: Diagnosis not present

## 2019-02-26 DIAGNOSIS — Z8673 Personal history of transient ischemic attack (TIA), and cerebral infarction without residual deficits: Secondary | ICD-10-CM

## 2019-02-26 DIAGNOSIS — Z951 Presence of aortocoronary bypass graft: Secondary | ICD-10-CM

## 2019-02-26 DIAGNOSIS — I252 Old myocardial infarction: Secondary | ICD-10-CM | POA: Diagnosis not present

## 2019-02-26 DIAGNOSIS — R630 Anorexia: Secondary | ICD-10-CM | POA: Diagnosis present

## 2019-02-26 DIAGNOSIS — I110 Hypertensive heart disease with heart failure: Secondary | ICD-10-CM | POA: Diagnosis not present

## 2019-02-26 DIAGNOSIS — N184 Chronic kidney disease, stage 4 (severe): Secondary | ICD-10-CM | POA: Diagnosis not present

## 2019-02-26 DIAGNOSIS — Z833 Family history of diabetes mellitus: Secondary | ICD-10-CM

## 2019-02-26 DIAGNOSIS — E1151 Type 2 diabetes mellitus with diabetic peripheral angiopathy without gangrene: Secondary | ICD-10-CM | POA: Diagnosis present

## 2019-02-26 DIAGNOSIS — R0902 Hypoxemia: Secondary | ICD-10-CM | POA: Diagnosis not present

## 2019-02-26 DIAGNOSIS — N179 Acute kidney failure, unspecified: Secondary | ICD-10-CM

## 2019-02-26 DIAGNOSIS — K5900 Constipation, unspecified: Secondary | ICD-10-CM | POA: Diagnosis not present

## 2019-02-26 DIAGNOSIS — R0602 Shortness of breath: Secondary | ICD-10-CM | POA: Diagnosis not present

## 2019-02-26 DIAGNOSIS — I509 Heart failure, unspecified: Secondary | ICD-10-CM

## 2019-02-26 DIAGNOSIS — N186 End stage renal disease: Secondary | ICD-10-CM | POA: Diagnosis not present

## 2019-02-26 DIAGNOSIS — Z7982 Long term (current) use of aspirin: Secondary | ICD-10-CM

## 2019-02-26 DIAGNOSIS — I5043 Acute on chronic combined systolic (congestive) and diastolic (congestive) heart failure: Secondary | ICD-10-CM | POA: Diagnosis present

## 2019-02-26 DIAGNOSIS — Z79899 Other long term (current) drug therapy: Secondary | ICD-10-CM

## 2019-02-26 DIAGNOSIS — I739 Peripheral vascular disease, unspecified: Secondary | ICD-10-CM | POA: Diagnosis not present

## 2019-02-26 DIAGNOSIS — Z794 Long term (current) use of insulin: Secondary | ICD-10-CM

## 2019-02-26 DIAGNOSIS — Z87891 Personal history of nicotine dependence: Secondary | ICD-10-CM

## 2019-02-26 DIAGNOSIS — R609 Edema, unspecified: Secondary | ICD-10-CM | POA: Diagnosis not present

## 2019-02-26 DIAGNOSIS — U071 COVID-19: Secondary | ICD-10-CM | POA: Diagnosis not present

## 2019-02-26 DIAGNOSIS — E119 Type 2 diabetes mellitus without complications: Secondary | ICD-10-CM

## 2019-02-26 DIAGNOSIS — I132 Hypertensive heart and chronic kidney disease with heart failure and with stage 5 chronic kidney disease, or end stage renal disease: Secondary | ICD-10-CM | POA: Diagnosis not present

## 2019-02-26 DIAGNOSIS — E872 Acidosis: Secondary | ICD-10-CM | POA: Diagnosis present

## 2019-02-26 DIAGNOSIS — K219 Gastro-esophageal reflux disease without esophagitis: Secondary | ICD-10-CM | POA: Diagnosis present

## 2019-02-26 DIAGNOSIS — Z888 Allergy status to other drugs, medicaments and biological substances status: Secondary | ICD-10-CM

## 2019-02-26 DIAGNOSIS — D649 Anemia, unspecified: Secondary | ICD-10-CM | POA: Diagnosis present

## 2019-02-26 DIAGNOSIS — I2583 Coronary atherosclerosis due to lipid rich plaque: Secondary | ICD-10-CM | POA: Diagnosis present

## 2019-02-26 DIAGNOSIS — Z89611 Acquired absence of right leg above knee: Secondary | ICD-10-CM | POA: Diagnosis not present

## 2019-02-26 DIAGNOSIS — I2510 Atherosclerotic heart disease of native coronary artery without angina pectoris: Secondary | ICD-10-CM | POA: Diagnosis not present

## 2019-02-26 DIAGNOSIS — Z8249 Family history of ischemic heart disease and other diseases of the circulatory system: Secondary | ICD-10-CM

## 2019-02-26 DIAGNOSIS — J90 Pleural effusion, not elsewhere classified: Secondary | ICD-10-CM

## 2019-02-26 DIAGNOSIS — E785 Hyperlipidemia, unspecified: Secondary | ICD-10-CM | POA: Diagnosis present

## 2019-02-26 DIAGNOSIS — Z4901 Encounter for fitting and adjustment of extracorporeal dialysis catheter: Secondary | ICD-10-CM | POA: Diagnosis not present

## 2019-02-26 DIAGNOSIS — Z885 Allergy status to narcotic agent status: Secondary | ICD-10-CM

## 2019-02-26 DIAGNOSIS — R0689 Other abnormalities of breathing: Secondary | ICD-10-CM | POA: Diagnosis not present

## 2019-02-26 LAB — COMPREHENSIVE METABOLIC PANEL
ALT: 20 U/L (ref 0–44)
AST: 25 U/L (ref 15–41)
Albumin: 3.4 g/dL — ABNORMAL LOW (ref 3.5–5.0)
Alkaline Phosphatase: 103 U/L (ref 38–126)
Anion gap: 12 (ref 5–15)
BUN: 66 mg/dL — ABNORMAL HIGH (ref 8–23)
CO2: 20 mmol/L — ABNORMAL LOW (ref 22–32)
Calcium: 9 mg/dL (ref 8.9–10.3)
Chloride: 111 mmol/L (ref 98–111)
Creatinine, Ser: 5 mg/dL — ABNORMAL HIGH (ref 0.44–1.00)
GFR calc Af Amer: 10 mL/min — ABNORMAL LOW (ref >60)
GFR calc non Af Amer: 8 mL/min — ABNORMAL LOW (ref >60)
Glucose, Bld: 108 mg/dL — ABNORMAL HIGH (ref 70–99)
Potassium: 4.3 mmol/L (ref 3.5–5.1)
Sodium: 143 mmol/L (ref 135–145)
Total Bilirubin: 1.1 mg/dL (ref 0.3–1.2)
Total Protein: 6.8 g/dL (ref 6.5–8.1)

## 2019-02-26 LAB — CBC WITH DIFFERENTIAL/PLATELET
Abs Immature Granulocytes: 0.04 10*3/uL (ref 0.00–0.07)
Basophils Absolute: 0 10*3/uL (ref 0.0–0.1)
Basophils Relative: 0 %
Eosinophils Absolute: 0.1 10*3/uL (ref 0.0–0.5)
Eosinophils Relative: 1 %
HCT: 27.3 % — ABNORMAL LOW (ref 36.0–46.0)
Hemoglobin: 8.1 g/dL — ABNORMAL LOW (ref 12.0–15.0)
Immature Granulocytes: 1 %
Lymphocytes Relative: 21 %
Lymphs Abs: 1.7 10*3/uL (ref 0.7–4.0)
MCH: 26.2 pg (ref 26.0–34.0)
MCHC: 29.7 g/dL — ABNORMAL LOW (ref 30.0–36.0)
MCV: 88.3 fL (ref 80.0–100.0)
Monocytes Absolute: 0.7 10*3/uL (ref 0.1–1.0)
Monocytes Relative: 8 %
Neutro Abs: 5.8 10*3/uL (ref 1.7–7.7)
Neutrophils Relative %: 69 %
Platelets: 234 10*3/uL (ref 150–400)
RBC: 3.09 MIL/uL — ABNORMAL LOW (ref 3.87–5.11)
RDW: 14.1 % (ref 11.5–15.5)
WBC: 8.3 10*3/uL (ref 4.0–10.5)
nRBC: 0 % (ref 0.0–0.2)

## 2019-02-26 LAB — GLUCOSE, CAPILLARY: Glucose-Capillary: 121 mg/dL — ABNORMAL HIGH (ref 70–99)

## 2019-02-26 LAB — IRON AND TIBC
Iron: 25 ug/dL — ABNORMAL LOW (ref 28–170)
Saturation Ratios: 8 % — ABNORMAL LOW (ref 10.4–31.8)
TIBC: 300 ug/dL (ref 250–450)
UIBC: 275 ug/dL

## 2019-02-26 LAB — CBC
HCT: 26.1 % — ABNORMAL LOW (ref 36.0–46.0)
Hemoglobin: 7.9 g/dL — ABNORMAL LOW (ref 12.0–15.0)
MCH: 26.8 pg (ref 26.0–34.0)
MCHC: 30.3 g/dL (ref 30.0–36.0)
MCV: 88.5 fL (ref 80.0–100.0)
Platelets: 223 10*3/uL (ref 150–400)
RBC: 2.95 MIL/uL — ABNORMAL LOW (ref 3.87–5.11)
RDW: 14.2 % (ref 11.5–15.5)
WBC: 7.3 10*3/uL (ref 4.0–10.5)
nRBC: 0 % (ref 0.0–0.2)

## 2019-02-26 LAB — VITAMIN B12: Vitamin B-12: 784 pg/mL (ref 180–914)

## 2019-02-26 LAB — BRAIN NATRIURETIC PEPTIDE: B Natriuretic Peptide: 1190.5 pg/mL — ABNORMAL HIGH (ref 0.0–100.0)

## 2019-02-26 LAB — CREATININE, SERUM
Creatinine, Ser: 5.03 mg/dL — ABNORMAL HIGH (ref 0.44–1.00)
GFR calc Af Amer: 10 mL/min — ABNORMAL LOW (ref >60)
GFR calc non Af Amer: 8 mL/min — ABNORMAL LOW (ref >60)

## 2019-02-26 LAB — TROPONIN I: Troponin I: 0.16 ng/mL (ref <0.03)

## 2019-02-26 LAB — FERRITIN: Ferritin: 78 ng/mL (ref 11–307)

## 2019-02-26 MED ORDER — FERROUS SULFATE 325 (65 FE) MG PO TABS
957.0000 mg | ORAL_TABLET | Freq: Every day | ORAL | Status: DC
  Administered 2019-02-27 – 2019-02-28 (×2): 975 mg via ORAL
  Filled 2019-02-26 (×2): qty 3

## 2019-02-26 MED ORDER — HYDRALAZINE HCL 100 MG PO TABS: 50.0000 mg | ORAL_TABLET | Freq: Three times a day (TID) | ORAL | Status: DC

## 2019-02-26 MED ORDER — ALPRAZOLAM 0.25 MG PO TABS
0.2500 mg | ORAL_TABLET | Freq: Every day | ORAL | Status: DC | PRN
  Administered 2019-02-27 (×2): 0.25 mg via ORAL
  Filled 2019-02-26 (×2): qty 1

## 2019-02-26 MED ORDER — FUROSEMIDE 10 MG/ML IJ SOLN
60.0000 mg | Freq: Two times a day (BID) | INTRAMUSCULAR | Status: DC
  Administered 2019-02-27: 60 mg via INTRAVENOUS
  Filled 2019-02-26: qty 6

## 2019-02-26 MED ORDER — NITROGLYCERIN 0.4 MG SL SUBL: 0.4000 mg | SUBLINGUAL_TABLET | SUBLINGUAL | Status: DC | PRN

## 2019-02-26 MED ORDER — POTASSIUM CHLORIDE CRYS ER 20 MEQ PO TBCR: 20.0000 meq | EXTENDED_RELEASE_TABLET | Freq: Once | ORAL | Status: DC

## 2019-02-26 MED ORDER — SODIUM CHLORIDE 0.9 % IV SOLN: 250.0000 mL | INTRAVENOUS | Status: DC | PRN

## 2019-02-26 MED ORDER — ASPIRIN 81 MG PO CHEW
81.0000 mg | CHEWABLE_TABLET | Freq: Every day | ORAL | Status: DC
  Administered 2019-02-27 – 2019-03-06 (×8): 81 mg via ORAL
  Filled 2019-02-26 (×8): qty 1

## 2019-02-26 MED ORDER — FUROSEMIDE 10 MG/ML IJ SOLN: 20.0000 mg | Freq: Once | INTRAMUSCULAR | Status: DC

## 2019-02-26 MED ORDER — LOSARTAN POTASSIUM 50 MG PO TABS: 100.0000 mg | ORAL_TABLET | Freq: Every day | ORAL | Status: DC

## 2019-02-26 MED ORDER — FUROSEMIDE 10 MG/ML IJ SOLN
60.0000 mg | Freq: Once | INTRAMUSCULAR | Status: AC
  Administered 2019-02-26: 60 mg via INTRAVENOUS
  Filled 2019-02-26: qty 6

## 2019-02-26 MED ORDER — INSULIN GLARGINE 100 UNIT/ML ~~LOC~~ SOLN
20.0000 [IU] | Freq: Every day | SUBCUTANEOUS | Status: DC
  Administered 2019-02-26 – 2019-03-05 (×8): 20 [IU] via SUBCUTANEOUS
  Filled 2019-02-26 (×9): qty 0.2

## 2019-02-26 MED ORDER — SODIUM CHLORIDE 0.9% FLUSH: 3.0000 mL | INTRAVENOUS | Status: DC | PRN

## 2019-02-26 MED ORDER — ISOSORBIDE MONONITRATE ER 30 MG PO TB24
30.0000 mg | ORAL_TABLET | Freq: Three times a day (TID) | ORAL | Status: DC
  Administered 2019-02-26 – 2019-03-01 (×10): 30 mg via ORAL
  Filled 2019-02-26 (×11): qty 1

## 2019-02-26 MED ORDER — HYDRALAZINE HCL 50 MG PO TABS
50.0000 mg | ORAL_TABLET | Freq: Three times a day (TID) | ORAL | Status: DC
  Administered 2019-02-26 – 2019-02-27 (×2): 50 mg via ORAL
  Filled 2019-02-26 (×2): qty 1

## 2019-02-26 MED ORDER — SODIUM BICARBONATE 650 MG PO TABS
1300.0000 mg | ORAL_TABLET | Freq: Two times a day (BID) | ORAL | Status: DC
  Administered 2019-02-26 – 2019-02-28 (×5): 1300 mg via ORAL
  Filled 2019-02-26 (×5): qty 2

## 2019-02-26 MED ORDER — VITAMIN D-3 125 MCG (5000 UT) PO TABS: 4000.0000 [IU] | ORAL_TABLET | Freq: Every day | ORAL | Status: DC

## 2019-02-26 MED ORDER — ZOLPIDEM TARTRATE 5 MG PO TABS
5.0000 mg | ORAL_TABLET | Freq: Every evening | ORAL | Status: DC | PRN
  Administered 2019-02-28 – 2019-03-04 (×5): 5 mg via ORAL
  Filled 2019-02-26 (×5): qty 1

## 2019-02-26 MED ORDER — DOXAZOSIN MESYLATE 1 MG PO TABS: 1.0000 mg | ORAL_TABLET | Freq: Every day | ORAL | Status: DC

## 2019-02-26 MED ORDER — ACETAMINOPHEN 325 MG PO TABS
650.0000 mg | ORAL_TABLET | ORAL | Status: DC | PRN
  Administered 2019-02-27 – 2019-03-06 (×7): 650 mg via ORAL
  Filled 2019-02-26 (×8): qty 2

## 2019-02-26 MED ORDER — POTASSIUM CHLORIDE CRYS ER 20 MEQ PO TBCR
40.0000 meq | EXTENDED_RELEASE_TABLET | Freq: Once | ORAL | Status: AC
  Administered 2019-02-26: 18:00:00 40 meq via ORAL
  Filled 2019-02-26: qty 2

## 2019-02-26 MED ORDER — HEPARIN SODIUM (PORCINE) 5000 UNIT/ML IJ SOLN
5000.0000 [IU] | Freq: Three times a day (TID) | INTRAMUSCULAR | Status: AC
  Administered 2019-02-26 – 2019-03-01 (×7): 5000 [IU] via SUBCUTANEOUS
  Filled 2019-02-26 (×7): qty 1

## 2019-02-26 MED ORDER — SODIUM CHLORIDE 0.9% FLUSH
3.0000 mL | Freq: Two times a day (BID) | INTRAVENOUS | Status: DC
  Administered 2019-02-26 – 2019-03-06 (×15): 3 mL via INTRAVENOUS

## 2019-02-26 MED ORDER — AMLODIPINE BESYLATE 5 MG PO TABS: 10.0000 mg | ORAL_TABLET | Freq: Every day | ORAL | Status: DC

## 2019-02-26 MED ORDER — ONDANSETRON HCL 4 MG/2ML IJ SOLN
4.0000 mg | Freq: Four times a day (QID) | INTRAMUSCULAR | Status: DC | PRN
  Administered 2019-03-04 – 2019-03-05 (×2): 4 mg via INTRAVENOUS
  Filled 2019-02-26: qty 2

## 2019-02-26 MED ORDER — ATORVASTATIN CALCIUM 80 MG PO TABS
80.0000 mg | ORAL_TABLET | Freq: Every day | ORAL | Status: DC
  Administered 2019-02-27 – 2019-03-06 (×8): 80 mg via ORAL
  Filled 2019-02-26 (×8): qty 1

## 2019-02-26 MED ORDER — VITAMIN D 25 MCG (1000 UNIT) PO TABS
4000.0000 [IU] | ORAL_TABLET | Freq: Every day | ORAL | Status: DC
  Administered 2019-02-27 – 2019-03-03 (×5): 4000 [IU] via ORAL
  Filled 2019-02-26 (×6): qty 4

## 2019-02-26 NOTE — Plan of Care (Signed)
  Problem: Clinical Measurements: Goal: Ability to maintain clinical measurements within normal limits will improve Outcome: Progressing   Problem: Health Behavior/Discharge Planning: Goal: Ability to manage health-related needs will improve Outcome: Progressing   Problem: Clinical Measurements: Goal: Respiratory complications will improve Outcome: Progressing

## 2019-02-26 NOTE — ED Triage Notes (Signed)
Pt from home with ems for c.o shortness of breath since Sunday. Pt 84% on room air, 3L [place] applied and pt increased to 94%. Pt also having some chest pain. Pt arrives alert and oriented, no resp distress noted at this time. VSS

## 2019-02-26 NOTE — Progress Notes (Signed)
Patient arrived to unit. Pt shob and oxygen sats remained in 80s, increased O2 to 6L and sats finally went up to 93%, will continue to monitor and wean O2 as needed.

## 2019-02-26 NOTE — ED Provider Notes (Signed)
Jenna Brown Provider Note   CSN: [id number] Arrival date & time: 02/26/19  1407  History   Chief Complaint Chief Complaint  Patient presents with   Shortness of Breath    HPI Jenna Brown is a 68 y.o. female w/ a hx of CAD, CHF last EF 45%  Stroke, anemia, anxiety, stage IV CKD, GERD, HTN, hyperlipidemia, & T2DM who presents to the ED w/ complaints of progressively worsening dyspnea x 3 days. Patient notes dyspnea is constant, significantly worse w/ slightest activity/exertion as well as worse w/ supine position, no alleviating factors. Has had some associated chest discomfort described as aching w/ the shortness of breath. Notes dry cough which is not necessarily new. She states that she has also had LLE edema x 2 weeks, non painful. Denies nausea, vomiting, diaphoresis, syncope, hemoptysis, recent surgery/trauma, recent long travel, hormone use, personal hx of cancer, or hx of DVT/PE. Has been non compliant w/ brillinta over the past 1 month. Taking her lasix as prescribed, did miss 1 dose a few days ago, but otherwise compliant.   Per EMS upon arrival patient 84% on RA, improved to 54% w/ application of 3L via [place]    HPI  Past Medical History:  Diagnosis Date   Anemia    Anxiety    takes Xanax prn   Arthritis    CKD (chronic kidney disease), stage IV (HCC)    Coronary artery disease 2016   cath w/ 90% LAD, 95%CFX, 80% OM 2, 60% RCA, not CABG candidate, rx medically   GERD (gastroesophageal reflux disease)    H/O hiatal hernia    Headache(784.0)    when b/p is elevated   Hemorrhoids    Hx of AKA (above knee amputation), right (HCC)    Hyperlipidemia    takes Simvastatin daily   Hypertension    takes Amlodipine/HCTZ/Losartan daily   Migraines    Myocardial infarction [facility]) 1990's   NSTEMI (non-ST elevated myocardial infarction) ([place])    Peripheral vascular disease ([place])    PONV (postoperative nausea and vomiting)      Stroke ([place])    TIA history   Type II diabetes mellitus ([place])    on Lantus   Vertigo    takes Ativert prn    Patient Active Problem List   Diagnosis Date Noted   Hypertensive urgency 09/15/2018   NSTEMI (non-ST elevated myocardial infarction) ([place]) 09/15/2018   Chest pain with high risk for cardiac etiology    Coronary artery disease    Unstable angina pectoris due to coronary arteriosclerosis ([place]) 04/19/2018   Goals of care, counseling/discussion    Palliative care by specialist    CKD (chronic kidney disease) stage 5, GFR less than 15 ml/min ([place])    Insulin-requiring or dependent type II diabetes mellitus ([place]) 01/11/2018   Cardiac mass    Chronic kidney disease 12/26/2016   Hypertensive emergency    Chest pain 08/11/2015   CAD in native artery    Cardiomyopathy, ischemic 05/14/2015   TIA (transient ischemic attack) 05/14/2015   PAD (peripheral artery disease) ([place]) 27/02/2375   Chronic systolic heart failure ([place]) 05/14/2015   Hyperkalemia    Coronary artery disease due to lipid rich plaque    Cardiac enzymes elevated    Non-ST elevation (NSTEMI) myocardial infarction [facility])    Essential hypertension    Hyperlipidemia LDL goal <70    Anemia 05/10/2012   Anxiety 05/09/2012   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[place])  05/02/2012   Stroke ([place])    Peripheral vascular disease [facility])     Past Surgical History:  Procedure Laterality Date   ABDOMINAL AORTAGRAM N/A 02/29/2012   Procedure: ABDOMINAL AORTAGRAM;  Surgeon: [name], MD;  Location: [facility] CATH LAB;  Service: Cardiovascular;  Laterality: N/A;   AMPUTATION  05/10/2012   Procedure: AMPUTATION ABOVE KNEE;  Surgeon: [name], MD;  Location: [facility] OR;  Service: Vascular;  Laterality: Right;   open right groin wound noted   aortogram  02/2012   CARDIAC CATHETERIZATION N/A 04/13/2015   Procedure: Right/Left Heart Cath and Coronary Angiography;  Surgeon: [name], MD;  Location: [facility] CV LAB;  Service: Cardiovascular;  Laterality: N/A;   CLEFT PALATE REPAIR     several   COLONOSCOPY     DILATION AND CURETTAGE OF UTERUS     FEMORAL-POPLITEAL BYPASS GRAFT  04/05/2012   Procedure: BYPASS GRAFT FEMORAL-POPLITEAL ARTERY;  Surgeon: [name], MD;  Location: [facility];  Service: Vascular;  Laterality: Right;  REVISION   FEMORAL-TIBIAL BYPASS GRAFT  03/29/2012   Procedure: BYPASS GRAFT FEMORAL-TIBIAL ARTERY;  Surgeon: [name], MD;  Location: [facility] OR;  Service: Vascular;  Laterality: Right;  Right femoral to Posterior Tibialis with composite graft of 70mm x 80 cm ringed gortex graft and saphenous vein  ,intraoperative arteriogram   FEMOROPOPLITEAL THROMBECTOMY / EMBOLECTOMY  05/09/2012   MYOMECTOMY     PR VEIN BYPASS GRAFT,AORTO-FEM-POP  05/27/11   Right SFA-Below knee Pop BP   TEE WITHOUT CARDIOVERSION N/A 01/03/2017   Procedure: TRANSESOPHAGEAL ECHOCARDIOGRAM (TEE);  Surgeon: Skeet Latch, MD;  Location: [facility] ENDOSCOPY;  Service: Cardiovascular;  Laterality: N/A;   TONSILLECTOMY     TUBAL LIGATION  ~ 1990     OB History   No obstetric history on file.      Home Medications    Prior to Admission medications   Medication Sig Start Date End Date Taking? Authorizing Provider  acetaminophen (TYLENOL) 500 MG tablet Take 500 mg by mouth every 6 (six) hours as needed for mild pain.    [provider]  ALPRAZolam Duanne Moron) 0.25 MG tablet Take 1 tablet (0.25 mg total) by mouth 3 (three) times daily as needed for anxiety. Patient taking differently: Take 0.25 mg by mouth daily as needed for anxiety.  01/03/17   Delos Haring, PA-C  amLODipine (NORVASC) 10 MG tablet Take 1 tablet (10 mg total) by mouth daily. 11/16/18   Skeet Latch, MD  aspirin 81 MG chewable tablet Chew 81 mg by mouth daily.    [provider]  atorvastatin (LIPITOR) 80 MG tablet Take 80 mg by mouth daily.  01/09/18   [provider]    carvedilol (COREG) 12.5 MG tablet 1 & 1/2 TABLETS TWICE A DAY 11/28/18   Skeet Latch, MD  Cholecalciferol (VITAMIN D-3) 25 MCG (1000 UT) CAPS Take 1,000 Units by mouth daily.    [provider]  doxazosin (CARDURA) 1 MG tablet Take 1 tablet (1 mg total) by mouth at bedtime. 09/16/18   Mikhail, Velta Addison, DO  ferrous sulfate 325 (65 FE) MG tablet Take 325 mg by mouth daily with breakfast.     [provider]  furosemide (LASIX) 40 MG tablet Take 1 tablet (40 mg total) by mouth daily as needed. Patient taking differently: Take 40 mg by mouth daily as needed for fluid.  01/29/18   [name], PA-C  hydrALAZINE (APRESOLINE) 100 MG tablet Take 1 tablet (100  mg total) by mouth 3 (three) times daily. 11/16/18   Skeet Latch, MD  insulin glargine (LANTUS) 100 UNIT/ML injection Inject 0.25 mLs (25 Units total) into the skin 2 (two) times daily. Patient taking differently: Inject 25 Units into the skin at bedtime.  01/03/17   Delos Haring, PA-C  isosorbide mononitrate (IMDUR) 120 MG 24 hr tablet Take 1 tablet (120 mg total) by mouth daily. 09/17/18   Mikhail, Velta Addison, DO  isosorbide mononitrate (IMDUR) 120 MG 24 hr tablet Take 1 tablet (120 mg total) by mouth daily. 01/21/19   Bensimhon, Shaune Pascal, MD  losartan (COZAAR) 100 MG tablet Take 100 mg by mouth daily.    [provider]  nitroGLYCERIN (NITROSTAT) 0.4 MG SL tablet Place 1 tablet (0.4 mg total) under the tongue every 5 (five) minutes x 3 doses as needed for chest pain. 08/13/15   Theora Gianotti, NP  ticagrelor (BRILINTA) 90 MG TABS tablet Take 1 tablet (90 mg total) by mouth 2 (two) times daily. 01/03/17   Delos Haring, PA-C    Family History Family History  Problem Relation Age of Onset   Cancer Mother        breast   Diabetes Father    Cancer Brother    Heart attack Maternal Grandmother    Heart attack Maternal Grandfather     Social History Social History   Tobacco Use    Smoking status: Former Smoker    Packs/day: 0.25    Years: 40.00    Pack years: 10.00    Types: Cigarettes    Last attempt to quit: 02/27/2012    Years since quitting: 7.0   Smokeless tobacco: Never Used  Substance Use Topics   Alcohol use: No   Drug use: No     Allergies   Plavix [clopidogrel bisulfate]; Codeine; and Tylenol with codeine #3 [acetaminophen-codeine]   Review of Systems Review of Systems  Constitutional: Negative for chills, diaphoresis and fever.  Respiratory: Positive for cough and shortness of breath.   Cardiovascular: Positive for chest pain and leg swelling. Negative for palpitations.  Gastrointestinal: Negative for abdominal pain, diarrhea, nausea and vomiting.  Neurological: Negative for syncope.  All other systems reviewed and are negative.   Physical Exam Updated Vital Signs BP 117/69    Pulse 98    Temp 98 F (36.7 C) (Oral)    Resp (!) 23    SpO2 93%   Physical Exam Vitals signs and nursing note reviewed.  Constitutional:      General: She is not in acute distress.    Appearance: She is well-developed. She is not toxic-appearing.  HENT:     Head: Normocephalic and atraumatic.  Eyes:     General:        Right eye: No discharge.        Left eye: No discharge.     Conjunctiva/sclera: Conjunctivae normal.  Neck:     Musculoskeletal: Neck supple.  Cardiovascular:     Rate and Rhythm: Regular rhythm. Tachycardia present.  Pulmonary:     Effort: Tachypnea present.     Breath sounds: Decreased breath sounds (bibasilar) present. No wheezing.     Comments: SpO2 desaturates to mid 80s on RA. Improves to 92-95% on 4L via [place].  Abdominal:     General: There is no distension.     Palpations: Abdomen is soft.     Tenderness: There is no abdominal tenderness.  Musculoskeletal:     Comments: RLE AKA.  LLE: 3-4+ pitting  edema to the lower leg & foot, trace at the knee. No overlying erythema/warmth. Intact AROM. Nontender. Compartments are soft.    Skin:    General: Skin is warm and dry.     Findings: No rash.  Neurological:     Mental Status: She is alert.     Comments: Clear speech.   Psychiatric:        Behavior: Behavior normal.    ED Treatments / Results  Labs (all labs ordered are listed, but only abnormal results are displayed) Labs Reviewed  CBC WITH DIFFERENTIAL/PLATELET - Abnormal; Notable for the following components:      Result Value   RBC 3.09 (*)    Hemoglobin 8.1 (*)    HCT 27.3 (*)    MCHC 29.7 (*)    All other components within normal limits  COMPREHENSIVE METABOLIC PANEL - Abnormal; Notable for the following components:   CO2 20 (*)    Glucose, Bld 108 (*)    BUN 66 (*)    Creatinine, Ser 5.00 (*)    Albumin 3.4 (*)    GFR calc non Af Amer 8 (*)    GFR calc Af Amer 10 (*)    All other components within normal limits  BRAIN NATRIURETIC PEPTIDE - Abnormal; Notable for the following components:   B Natriuretic Peptide 1,190.5 (*)    All other components within normal limits  TROPONIN I - Abnormal; Notable for the following components:   Troponin I 0.16 (*)    All other components within normal limits    EKG EKG Interpretation  Date/Time:  Tuesday February 26 2019 14:15:12 EDT Ventricular Rate:  107 PR Interval:    QRS Duration: 101 QT Interval:  346 QTC Calculation: 462 R Axis:   80 Text Interpretation:  Sinus tachycardia Nonspecific T abnormalities, inferior leads T wave changes similar to Dec 2019 Confirmed by Sherwood Gambler [phone]) on 02/26/2019 2:38:52 PM   Radiology Dg Chest Portable 1 View  Result Date: 02/26/2019 CLINICAL DATA:  Hypoxia, acute dyspnea. EXAM: PORTABLE CHEST 1 VIEW COMPARISON:  Radiograph of September 14, 2018. FINDINGS: Stable cardiomediastinal silhouette. Moderate bilateral pleural effusions are noted with associated atelectasis or edema. No pneumothorax is noted. Bony thorax is unremarkable. IMPRESSION: Interval development of moderate pleural effusions with associated  atelectasis or edema. Electronically Signed   By: Marijo Conception M.D.   On: 02/26/2019 14:56    Procedures Procedures (including critical care time)  CRITICAL CARE Performed by: Kennith Maes   Total critical care time: 40 minutes CHF w/ new O2 requirement.   Critical care time was exclusive of separately billable procedures and treating other patients.  Critical care was necessary to treat or prevent imminent or life-threatening deterioration.  Critical care was time spent personally by me on the following activities: development of treatment plan with patient and/or surrogate as well as nursing, discussions with consultants, evaluation of patient's response to treatment, examination of patient, obtaining history from patient or surrogate, ordering and performing treatments and interventions, ordering and review of laboratory studies, ordering and review of radiographic studies, pulse oximetry and re-evaluation of patient's condition.  Medications Ordered in ED Medications  furosemide (LASIX) injection 60 mg (has no administration in time range)  potassium chloride SA (K-DUR) CR tablet 40 mEq (has no administration in time range)    Prior procedures reviewed:  Echocardiogram 08/01/18 Impressions:  - EF using wall tracking approximately 45%. Hypokinesis of distal   inferior, inferolateral, inferoseptal, and apical walls. Wall  motion visually appears similar to prior. Thickened LCC of aortic   valve appears similar to prior.   Initial Impression / Assessment and Plan / ED Course  I have reviewed the triage vital signs and the nursing notes.  Pertinent labs & imaging results that were available during my care of the patient were reviewed by me and considered in my medical decision making (see chart for details).   Patient presents to the ED w/ dyspnea x 3 days & LLE edema x 2 weeks. Nontoxic appearing, hypoxic w/ new oxygen requirement, mildly tachycardic, afebrile.  Exam w/ decreased breath sounds and fairly prominent edema to LLE w/o overlying signs of infection. DDX: CHF exacerbation, DVT/PE, ACS, pneumonia, symptomatic anemia, highest suspicion for CHF. Plan for cardiac work up & LLE DVT study- DVT/PE seems less likely but difficult w/ RLE AKA.   CBC: Baseline anemia, no leukocytosis.  CMP: AKI: baseline creatinine appears to be in upper 3s, now 5.00 today, BUN elevated @ 66.  BNP: elevated @ 1190.5 Trop: elevated @ 0.16 EKG: Appears fairly similar to prior.  CXR: Interval development of moderate pleural effusions with associated atelectasis or edema.   Overall presentation seems most consistent w/ CHF exacerbation w/ new oxygen requirement, troponin elevation likely secondary to demand. Lasix & potassium ordered per discussion w/ Dr. Tomi Bamberger. Venous duplex pending. Consultation placed to hospitalist service.  Findings and plan of care discussed with supervising physician Dr. Tomi Bamberger who is in agreement.   16:48: CONSULT: Discussed w/ hospitalist Dr. Evangeline Gula, accepts admission, requesting initial IV Lasix dose of 60 mg- this & PO potassium have been adjusted.   Discussed w/ vascular technician- Venous duplex of LLE negative for DVT.   Final Clinical Impressions(s) / ED Diagnoses   Final diagnoses:  Acute on chronic congestive heart failure, unspecified heart failure type ([place])  Acute respiratory failure with hypoxia ([place])  AKI (acute kidney injury) [facility])    ED Discharge Orders    None       Amaryllis Dyke, PA-C 02/26/19 1726    Dorie Rank, MD 02/28/19 [phone]

## 2019-02-26 NOTE — ED Notes (Signed)
Provider aware of troponin

## 2019-02-26 NOTE — H&P (Signed)
History and Physical    DARILYN STORBECK XNT:[id number] DOB: [birth year] DOA: 02/26/2019  PCP: [name], MD  Patient coming from: Home  I have personally briefly reviewed patient's old medical records in [place]  Chief Complaint: Shortness of breath  HPI: DEBRA COLON is a 68 y.o. female with medical history significant of coronary artery disease congestive heart failure with an ejection fraction of 45 to 50% in November 2019 with associated ischemic cardiomyopathy and hypokinesis, prior stroke, anemia, anxiety, stage IV chronic kidney disease, gastroesophageal reflux disease, hypertension, hyperlipidemia, and type 2 diabetes who presents emergency department complaining of worsening dyspnea x3 days. Dyspnea is constant and significantly worse with the slightest activity or exertion as well as worse with supine position.  In fact patient was having her lower extremity Doppler performed when I evaluated her and needed to pause a test in order to sit up to breathe better.  She has had no alleviating factors nothing makes it better.  She has had some associated chest discomfort described as aching associated with her shortness of breath.  She has had some dry cough which is not necessarily new.  Had lower extremity edema for 2 weeks is nonpainful.  Had no nausea vomiting, no diaphoresis, no syncope, no hemoptysis, no recent surgery or trauma, no recent long travel, no hormone use no personal history of cancer or DVT PE.  The past month she has stopped taking her Brilinta but has been taking her Lasix as prescribed which apparently is every other day.  She did miss 1 dose a few days ago but was otherwise compliant.  Arrival of EMS to her home her sats were found to be 84% on room air she was improved to 96% with application of 3 L nasal cannula.  ED Course: In the emergency department he was noted to have a markedly elevated creatinine of 5 up from 3-1/2, her CO2 is 20, her BNP is 1190,  this is the highest is ever been registered, her troponin 0 0.16, hemoglobin 8.1, white blood cell count 8.3.  She was afebrile.  Referred to me for further evaluation and management  Review of Systems: As per HPI otherwise all other systems reviewed and  negative.   Past Medical History:  Diagnosis Date   Anemia    Anxiety    takes Xanax prn   Arthritis    CKD (chronic kidney disease), stage IV (HCC)    Coronary artery disease 2016   cath w/ 90% LAD, 95%CFX, 80% OM 2, 60% RCA, not CABG candidate, rx medically   GERD (gastroesophageal reflux disease)    H/O hiatal hernia    Headache(784.0)    when b/p is elevated   Hemorrhoids    Hx of AKA (above knee amputation), right (HCC)    Hyperlipidemia    takes Simvastatin daily   Hypertension    takes Amlodipine/HCTZ/Losartan daily   Migraines    Myocardial infarction [facility]) 1990's   NSTEMI (non-ST elevated myocardial infarction) ([place])    Peripheral vascular disease (HCC)    PONV (postoperative nausea and vomiting)    Stroke ([place])    TIA history   Type II diabetes mellitus ([place])    on Lantus   Vertigo    takes Ativert prn    Past Surgical History:  Procedure Laterality Date   ABDOMINAL AORTAGRAM N/A 02/29/2012   Procedure: ABDOMINAL Maxcine Ham;  Surgeon: [name], MD;  Location[facility] C[facility] LAB;  Service: Cardiovascular;  Laterality: N/A;  AMPUTATION  05/10/2012   Procedure: AMPUTATION ABOVE KNEE;  Surgeon: [name], MD;  Location: [facility] OR;  Service: Vascular;  Laterality: Right;   open right groin wound noted   aortogram  02/2012   CARDIAC CATHETERIZATION N/A 04/13/2015   Procedure: Right/Left Heart Cath and Coronary Angiography;  Surgeon: [name], MD;  Location: [place] CV LAB;  Service: Cardiovascular;  Laterality: N/A;   CLEFT PALATE REPAIR     several   COLONOSCOPY     DILATION AND CURETTAGE OF UTERUS     FEMORAL-POPLITEAL BYPASS GRAFT  04/05/2012   Procedure: BYPASS GRAFT  FEMORAL-POPLITEAL ARTERY;  Surgeon: [name], MD;  Location: [place];  Service: Vascular;  Laterality: Right;  REVISION   FEMORAL-TIBIAL BYPASS GRAFT  03/29/2012   Procedure: BYPASS GRAFT FEMORAL-TIBIAL ARTERY;  Surgeon: [name], MD;  Location: [place] OR;  Service: Vascular;  Laterality: Right;  Right femoral to Posterior Tibialis with composite graft of 25mm x 80 cm ringed gortex graft and saphenous vein  ,intraoperative arteriogram   FEMOROPOPLITEAL THROMBECTOMY / EMBOLECTOMY  05/09/2012   MYOMECTOMY     PR VEIN BYPASS GRAFT,AORTO-FEM-POP  05/27/11   Right SFA-Below knee Pop BP   TEE WITHOUT CARDIOVERSION N/A 01/03/2017   Procedure: TRANSESOPHAGEAL ECHOCARDIOGRAM (TEE);  Surgeon: Skeet Latch, MD;  Location: [place];  Service: Cardiovascular;  Laterality: N/A;   TONSILLECTOMY     TUBAL LIGATION  ~ [place] History Narrative   She lives in [place] with family.     reports that she quit smoking about 7 years ago. Her smoking use included cigarettes. She has a 10.00 pack-year smoking history. She has never used smokeless tobacco. She reports that she does not drink alcohol or use drugs.  Allergies  Allergen Reactions   Plavix [Clopidogrel Bisulfate] Itching   Codeine Other (See Comments)    "makes me feel strange"   Tylenol With Codeine #3 [Acetaminophen-Codeine] Other (See Comments)    "doesn't make me feel right"    Family History  Problem Relation Age of Onset   Cancer Mother        breast   Diabetes Father    Cancer Brother    Heart attack Maternal Grandmother    Heart attack Maternal Grandfather      Prior to Admission medications   Medication Sig Start Date End Date Taking? Authorizing Provider  acetaminophen (TYLENOL) 500 MG tablet Take 500 mg by mouth every 6 (six) hours as needed for mild pain.   Yes [provider]  ALPRAZolam (XANAX) 0.25 MG tablet Take 1 tablet (0.25 mg total) by mouth 3 (three) times  daily as needed for anxiety. Patient taking differently: Take 0.25 mg by mouth daily as needed for anxiety.  01/03/17  Yes Carlota Raspberry, Tiffany, PA-C  amLODipine (NORVASC) 10 MG tablet Take 1 tablet (10 mg total) by mouth daily. 11/16/18  Yes Skeet Latch, MD  aspirin 81 MG chewable tablet Chew 81 mg by mouth daily.   Yes [provider]  atorvastatin (LIPITOR) 80 MG tablet Take 80 mg by mouth daily.  01/09/18  Yes [provider]  Cholecalciferol (VITAMIN D-3 PO) Take 4,000 Units by mouth daily.    Yes [provider]  doxazosin (CARDURA) 1 MG tablet Take 1 tablet (1 mg total) by mouth at bedtime. 09/16/18  Yes Mikhail, [place], DO  ferrous sulfate 325 (65 FE) MG tablet Take 957 mg by mouth daily with breakfast.  Yes [provider]  furosemide (LASIX) 40 MG tablet Take 1 tablet (40 mg total) by mouth daily as needed. Patient taking differently: Take 40 mg by mouth every other day.  01/29/18  Yes [name], PA-C  hydrALAZINE (APRESOLINE) 100 MG tablet Take 1 tablet (100 mg total) by mouth 3 (three) times daily. Patient taking differently: Take 50 mg by mouth 3 (three) times daily.  11/16/18  Yes Skeet Latch, MD  insulin glargine (LANTUS) 100 UNIT/ML injection Inject 0.25 mLs (25 Units total) into the skin 2 (two) times daily. Patient taking differently: Inject 20 Units into the skin at bedtime.  01/03/17  Yes Carlota Raspberry, Tiffany, PA-C  isosorbide mononitrate (IMDUR) 120 MG 24 hr tablet Take 1 tablet (120 mg total) by mouth daily. Patient taking differently: Take 30 mg by mouth 3 (three) times daily.  01/21/19  Yes Bensimhon, Shaune Pascal, MD  losartan (COZAAR) 100 MG tablet Take 100 mg by mouth daily.   Yes [provider]  nitroGLYCERIN (NITROSTAT) 0.4 MG SL tablet Place 1 tablet (0.4 mg total) under the tongue every 5 (five) minutes x 3 doses as needed for chest pain. 08/13/15  Yes Theora Gianotti, NP  sodium bicarbonate 650 MG tablet  Take 1,300 mg by mouth 2 (two) times daily.   Yes [provider]  sodium polystyrene (KAYEXALATE) 15 GM/60ML suspension Take 7.5 mg by mouth once.   Yes [provider]    Physical Exam:  Constitutional: Moderately uncomfortable unable to lie flat appears chronically ill Vitals:   02/26/19 1411 02/26/19 1414 02/26/19 1430 02/26/19 1500  BP:  126/88 117/69 113/69  Pulse:  (!) 109 98 98  Resp:  (!) 31 (!) 23 (!) 23  Temp:  98 F (36.7 C)    TempSrc:  Oral    SpO2: 94% 92% 93% 93%   Eyes: PERRL, lids and conjunctivae normal ENMT: Mucous membranes are moist. Posterior pharynx clear of any exudate or lesions.Normal dentition.  Neck: normal, supple, no masses, no thyromegaly Respiratory: deCreased breath sounds auscultation bilaterally, no wheezing, bilateral  crackles beginning halfway up lung fields.  Increased respiratory effort.  Mild accessory muscle use.  Cardiovascular: Tachycardic rate and rhythm, no murmurs / rubs / gallops.  Nonpitting extremity edema doughy edema of the hips. 2+ pedal pulses. No carotid bruits.  Abdomen: no tenderness, no masses palpated. No hepatosplenomegaly. Bowel sounds positive.  Musculoskeletal: no clubbing / cyanosis. No joint deformity upper and lower extremities.  Status post right AKA, good ROM, no contractures.  Poor muscle tone.  Skin: no rashes, lesions, ulcers. No induration Neurologic: CN 2-12 grossly intact. Sensation intact, DTR normal. Strength 5/5 in all 4.  Psychiatric: Normal judgment and insight. Alert and oriented x 3. Normal mood.    Labs on Admission: I have personally reviewed following labs and imaging studies  CBC: Recent Labs  Lab 02/26/19 1445  WBC 8.3  NEUTROABS 5.8  HGB 8.1*  HCT 27.3*  MCV 88.3  PLT 902   Basic Metabolic Panel: Recent Labs  Lab 02/26/19 1445  NA 143  K 4.3  CL 111  CO2 20*  GLUCOSE 108*  BUN 66*  CREATININE 5.00*  CALCIUM 9.0   Liver Function Tests: Recent Labs  Lab  02/26/19 1445  AST 25  ALT 20  ALKPHOS 103  BILITOT 1.1  PROT 6.8  ALBUMIN 3.4*   Cardiac Enzymes: Recent Labs  Lab 02/26/19 1445  TROPONINI 0.16*   Urine analysis:    Component Value  Date/Time   COLORURINE YELLOW 09/14/2018 2330   APPEARANCEUR CLOUDY (A) 09/14/2018 2330   LABSPEC 1.014 09/14/2018 2330   PHURINE 7.0 09/14/2018 2330   GLUCOSEU 50 (A) 09/14/2018 2330   HGBUR NEGATIVE 09/14/2018 2330   BILIRUBINUR NEGATIVE 09/14/2018 2330   KETONESUR 5 (A) 09/14/2018 2330   PROTEINUR 100 (A) 09/14/2018 2330   UROBILINOGEN 0.2 04/10/2015 1248   NITRITE NEGATIVE 09/14/2018 2330   LEUKOCYTESUR TRACE (A) 09/14/2018 2330    Radiological Exams on Admission: Dg Chest Portable 1 View  Result Date: 02/26/2019 CLINICAL DATA:  Hypoxia, acute dyspnea. EXAM: PORTABLE CHEST 1 VIEW COMPARISON:  Radiograph of September 14, 2018. FINDINGS: Stable cardiomediastinal silhouette. Moderate bilateral pleural effusions are noted with associated atelectasis or edema. No pneumothorax is noted. Bony thorax is unremarkable. IMPRESSION: Interval development of moderate pleural effusions with associated atelectasis or edema. Electronically Signed   By: Marijo Conception M.D.   On: 02/26/2019 14:56   Vas [place] Lower Extremity Venous (dvt) (mc And Wl 7a-7p)  Result Date: 02/26/2019  Lower Venous Study Indications: Edema.  Risk Factors: CHF. Limitations: Poor ultrasound/tissue interface. Comparison Study: No recent studies. Performing Technologist: Oliver Hum RVT  Examination Guidelines: A complete evaluation includes B-mode imaging, spectral Doppler, color Doppler, and power Doppler as needed of all accessible portions of each vessel. Bilateral testing is considered an integral part of a complete examination. Limited examinations for reoccurring indications may be performed as noted.  +-----+---------------+---------+-----------+----------+-------+  RIGHT Compressibility Phasicity Spontaneity Properties Summary   +-----+---------------+---------+-----------+----------+-------+  CFV   Full            Yes       Yes                             +-----+---------------+---------+-----------+----------+-------+   +---------+---------------+---------+-----------+----------+-------+  LEFT      Compressibility Phasicity Spontaneity Properties Summary  +---------+---------------+---------+-----------+----------+-------+  CFV       Full            Yes       Yes                             +---------+---------------+---------+-----------+----------+-------+  SFJ       Full                                                      +---------+---------------+---------+-----------+----------+-------+  FV Prox   Full                                                      +---------+---------------+---------+-----------+----------+-------+  FV Mid    Full                                                      +---------+---------------+---------+-----------+----------+-------+  FV Distal Full                                                      +---------+---------------+---------+-----------+----------+-------+  PFV       Full                                                      +---------+---------------+---------+-----------+----------+-------+  POP       Full            Yes       Yes                             +---------+---------------+---------+-----------+----------+-------+  PTV       Full                                                      +---------+---------------+---------+-----------+----------+-------+  PERO      Full                                                      +---------+---------------+---------+-----------+----------+-------+     Summary: Right: No evidence of common femoral vein obstruction. Left: There is no evidence of deep vein thrombosis in the lower extremity. No cystic structure found in the popliteal fossa.  *See table(s) above for measurements and observations. Electronically signed by Harold Barban MD on 02/26/2019  at 6:19:35 PM.    Final     EKG: Independently reviewed. Sinus tachycardia Nonspecific T abnormalities, inferior leads T wave changes similar to Dec 2019.  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  Her renal function is worse.  Her BNP is skyhigh.  I have consulted cardiology as well as nephrology.  I am starting her on much higher doses of Lasix than her usual home dose.  Her usual dose is 40 every other day.  I am starting her on Lasix 60 mg IV every 12 hours.  We will recheck labs in a.m.  2.  Bilateral pleural effusions: Likely accounting for some of patient's shortness of breath in addition to her congestive heart failure.  Diuresis and monitor chest x-ray recommend repeat chest x-ray in a couple of days.  If no significant improvement patient might benefit from thoracentesis.  3.  Coronary artery disease due to lipid rich plaque: Noted continue home medications.  4.  Diabetes mellitus type 2: On insulin: We will continue home medication regimen and place on sliding scale insulin coverage.  5.  Anxiety: Continue home medication management.  6.  Anemia: Likely related to  low recent erythropoietin levels await nephrology evaluation patient might benefit from starting erythropoietin based on nephrology recommendations.  7.  Hyper lipidemia: Continue home  medication management  DVT prophylaxis: Subcu heparin Code Status: Full code Family Communication: Dasja Brase, sister @ [phone] update on condition and plans call mobile at [phone] if no answer at home Disposition Plan: home 3-4 days  Consults called: cardiology Johnsie Cancel via message to [facility]) and nephrology Dr. Arty Baumgartner Admission status: inpatient  Admit - It is my clinical opinion that admission to INPATIENT is reasonable and necessary because of the expectation that this patient will require hospital care that crosses at least 2 midnights to treat this condition based on the medical complexity of the problems presented.  Given the aforementioned information, the predictability of an adverse outcome is felt to be significant.  Lady Deutscher MD FACP [facility] Pager [phone]  How to contact the [facility] Attending or Consulting provider [place] or covering provider during after hours [place], for this patient?  1. Check the care team in [facility] and look for a) attending/consulting [facility] provider listed and b) the [facility] team listed 2. Log into [email or web address] and use [place]'s universal password to access. If you do not have the password, please contact the hospital operator. 3. Locate the [facility] provider you are looking for under [facility] and page to a number that you can be directly reached. 4. If you still have difficulty reaching the provider, please page the [facility] (Director on Call) for the Hospitalists listed on amion for assistance.  If 7PM-7AM, please contact night-coverage [email or web address] Password [facility]  02/26/2019, 7:24 PM

## 2019-02-26 NOTE — Consult Note (Signed)
Reason for Consult:AKI/CKD stage 4 Referring Physician:  Evangeline Gula, Fitzgerald is an 68 y.o. female.  HPI:  Pt has a PMH significant for multivessel CAD (inoperable), ICMP (EF 45-50%), IDDM, longstanding, poorly controlled HTN, HLD, PVD s/p RAKA, CVA, and CKD stage 4 (follows Dr. Joesph July in [facility]) who presented to [facility] ED with a 3 day history of worsening SOB and lower extremity edema of her left leg.  EMS arrived at her home and noted hypoxia on room air with O2 sat of 84% and started on 3 liters [place] with improvement to 94%.  CXR showed moderate pleual effusions and edema.  Labs revealed an elevated BNP of 1190 and AKI/CKD with BUN/Cr of 66/5 (last documented Cr was 3.9 on 09/16/18).  The trend in Scr is seen below.  She is being admitted with acute on chronic CHF and we have been consulted to further evaluate her AKI/CKD.  Trend in Creatinine: Creatinine, Ser  Date/Time Value Ref Range Status  02/26/2019 02:45 PM 5.00 (H) 0.44 - 1.00 mg/dL Final  09/16/2018 05:36 AM 3.90 (H) 0.44 - 1.00 mg/dL Final  09/14/2018 09:11 PM 3.80 (H) 0.44 - 1.00 mg/dL Final  09/14/2018 09:05 PM 3.77 (H) 0.44 - 1.00 mg/dL Final  04/23/2018 06:08 AM 4.20 (H) 0.44 - 1.00 mg/dL Final  04/22/2018 04:44 AM 4.24 (H) 0.44 - 1.00 mg/dL Final  04/21/2018 08:29 AM 4.03 (H) 0.44 - 1.00 mg/dL Final  04/20/2018 03:13 AM 3.65 (H) 0.44 - 1.00 mg/dL Final  04/19/2018 07:43 PM 3.42 (H) 0.44 - 1.00 mg/dL Final  04/04/2018 03:20 PM 3.21 (H) 0.44 - 1.00 mg/dL Final  02/27/2018 02:37 PM 3.60 (H) 0.44 - 1.00 mg/dL Final  01/29/2018 02:15 PM 3.14 (H) 0.44 - 1.00 mg/dL Final  01/15/2018 02:59 AM 3.85 (H) 0.44 - 1.00 mg/dL Final  01/14/2018 03:06 AM 4.09 (H) 0.44 - 1.00 mg/dL Final  01/13/2018 02:29 AM 4.29 (H) 0.44 - 1.00 mg/dL Final  01/12/2018 03:35 AM 3.87 (H) 0.44 - 1.00 mg/dL Final  01/11/2018 03:51 AM 3.67 (H) 0.44 - 1.00 mg/dL Final  04/26/2017 11:52 AM 2.72 (H) 0.44 - 1.00 mg/dL Final  04/19/2017 10:44 AM 2.75 (H) 0.44 -  1.00 mg/dL Final  03/30/2017 06:05 AM 2.85 (H) 0.44 - 1.00 mg/dL Final  03/29/2017 03:05 AM 2.93 (H) 0.44 - 1.00 mg/dL Final  03/28/2017 02:50 AM 3.11 (H) 0.44 - 1.00 mg/dL Final  03/27/2017 02:24 AM 3.28 (H) 0.44 - 1.00 mg/dL Final  03/26/2017 03:10 AM 3.33 (H) 0.44 - 1.00 mg/dL Final  03/25/2017 06:09 AM 3.14 (H) 0.44 - 1.00 mg/dL Final  03/24/2017 06:52 AM 2.61 (H) 0.44 - 1.00 mg/dL Final  03/24/2017 12:09 AM 2.56 (H) 0.44 - 1.00 mg/dL Final  01/18/2017 03:54 PM 3.36 (H) 0.44 - 1.00 mg/dL Final  01/02/2017 03:57 AM 3.28 (H) 0.44 - 1.00 mg/dL Final  01/01/2017 05:22 AM 3.25 (H) 0.44 - 1.00 mg/dL Final  12/31/2016 04:54 AM 3.34 (H) 0.44 - 1.00 mg/dL Final  12/30/2016 04:08 AM 3.32 (H) 0.44 - 1.00 mg/dL Final  12/29/2016 01:49 PM 3.41 (H) 0.44 - 1.00 mg/dL Final  12/29/2016 11:51 AM 3.28 (H) 0.44 - 1.00 mg/dL Final  12/28/2016 12:22 PM 3.65 (H) 0.44 - 1.00 mg/dL Final  12/28/2016 03:47 AM 3.57 (H) 0.44 - 1.00 mg/dL Final  12/27/2016 05:38 AM 3.51 (H) 0.44 - 1.00 mg/dL Final  12/26/2016 04:00 AM 3.24 (H) 0.44 - 1.00 mg/dL Final  12/25/2016 04:49 AM 2.60 (H) 0.44 - 1.00 mg/dL  Final  12/25/2016 12:42 AM 2.90 (H) 0.44 - 1.00 mg/dL Final  08/11/2015 04:23 AM 1.32 (H) 0.44 - 1.00 mg/dL Final  08/11/2015 12:41 AM 1.25 (H) 0.44 - 1.00 mg/dL Final  07/27/2015 11:39 AM 1.41 (H) 0.44 - 1.00 mg/dL Final  05/14/2015 11:03 AM 1.10 (H) 0.44 - 1.00 mg/dL Final  04/22/2015 03:00 AM 1.24 (H) 0.44 - 1.00 mg/dL Final  04/21/2015 03:20 AM 1.27 (H) 0.44 - 1.00 mg/dL Final  04/20/2015 03:15 AM 1.15 (H) 0.44 - 1.00 mg/dL Final  04/19/2015 06:47 AM 1.19 (H) 0.44 - 1.00 mg/dL Final  04/18/2015 04:00 AM 1.22 (H) 0.44 - 1.00 mg/dL Final  04/17/2015 02:55 AM 1.22 (H) 0.44 - 1.00 mg/dL Final  04/16/2015 05:00 AM 1.36 (H) 0.44 - 1.00 mg/dL Final  04/15/2015 02:35 AM 1.36 (H) 0.44 - 1.00 mg/dL Final    PMH:   Past Medical History:  Diagnosis Date  . Anemia   . Anxiety    takes Xanax prn  . Arthritis   .  CKD (chronic kidney disease), stage IV ([place])   . Coronary artery disease 2016   cath w/ 90% LAD, 95%CFX, 80% OM 2, 60% RCA, not CABG candidate, rx medically  . GERD (gastroesophageal reflux disease)   . H/O hiatal hernia   . Headache(784.0)    when b/p is elevated  . Hemorrhoids   . Hx of AKA (above knee amputation), right ([place])   . Hyperlipidemia    takes Simvastatin daily  . Hypertension    takes Amlodipine/HCTZ/Losartan daily  . Migraines   . Myocardial infarction ([place]) 1990's  . NSTEMI (non-ST elevated myocardial infarction) ([place])   . Peripheral vascular disease ([place])   . PONV (postoperative nausea and vomiting)   . Stroke [facility])    TIA history  . Type II diabetes mellitus (HCC)    on Lantus  . Vertigo    takes Ativert prn    PSH:   Past Surgical History:  Procedure Laterality Date  . ABDOMINAL AORTAGRAM N/A 02/29/2012   Procedure: ABDOMINAL Maxcine Ham;  Surgeon: [name], MD;  Location: [facility] CATH LAB;  Service: Cardiovascular;  Laterality: N/A;  . AMPUTATION  05/10/2012   Procedure: AMPUTATION ABOVE KNEE;  Surgeon: [name], MD;  Location: [place] OR;  Service: Vascular;  Laterality: Right;   open right groin wound noted  . aortogram  02/2012  . CARDIAC CATHETERIZATION N/A 04/13/2015   Procedure: Right/Left Heart Cath and Coronary Angiography;  Surgeon: [name], MD;  Location: [place] CV LAB;  Service: Cardiovascular;  Laterality: N/A;  . CLEFT PALATE REPAIR     several  . COLONOSCOPY    . DILATION AND CURETTAGE OF UTERUS    . FEMORAL-POPLITEAL BYPASS GRAFT  04/05/2012   Procedure: BYPASS GRAFT FEMORAL-POPLITEAL ARTERY;  Surgeon: [name], MD;  Location: [place];  Service: Vascular;  Laterality: Right;  REVISION  . FEMORAL-TIBIAL BYPASS GRAFT  03/29/2012   Procedure: BYPASS GRAFT FEMORAL-TIBIAL ARTERY;  Surgeon: [name], MD;  Location: [place] OR;  Service: Vascular;  Laterality: Right;  Right femoral to Posterior Tibialis with composite graft of  62mm x 80 cm ringed gortex graft and saphenous vein  ,intraoperative arteriogram  . FEMOROPOPLITEAL THROMBECTOMY / EMBOLECTOMY  05/09/2012  . MYOMECTOMY    . PR VEIN BYPASS GRAFT,AORTO-FEM-POP  05/27/11   Right SFA-Below knee Pop BP  . TEE WITHOUT CARDIOVERSION N/A 01/03/2017   Procedure: TRANSESOPHAGEAL ECHOCARDIOGRAM (TEE);  Surgeon: Skeet Latch, MD;  Location: [place];  Service: Cardiovascular;  Laterality: N/A;  . TONSILLECTOMY    . TUBAL LIGATION  ~ 1990    Allergies:  Allergies  Allergen Reactions  . Plavix [Clopidogrel Bisulfate] Itching  . Codeine Other (See Comments)    "makes me feel strange"  . Tylenol With Codeine #3 [Acetaminophen-Codeine] Other (See Comments)    "doesn't make me feel right"    Medications:   Prior to Admission medications   Medication Sig Start Date End Date Taking? Authorizing Provider  acetaminophen (TYLENOL) 500 MG tablet Take 500 mg by mouth every 6 (six) hours as needed for mild pain.   Yes [provider]  ALPRAZolam (XANAX) 0.25 MG tablet Take 1 tablet (0.25 mg total) by mouth 3 (three) times daily as needed for anxiety. Patient taking differently: Take 0.25 mg by mouth daily as needed for anxiety.  01/03/17  Yes Carlota Raspberry, Tiffany, PA-C  amLODipine (NORVASC) 10 MG tablet Take 1 tablet (10 mg total) by mouth daily. 11/16/18  Yes Skeet Latch, MD  aspirin 81 MG chewable tablet Chew 81 mg by mouth daily.   Yes [provider]  atorvastatin (LIPITOR) 80 MG tablet Take 80 mg by mouth daily.  01/09/18  Yes [provider]  Cholecalciferol (VITAMIN D-3 PO) Take 4,000 Units by mouth daily.    Yes [provider]  doxazosin (CARDURA) 1 MG tablet Take 1 tablet (1 mg total) by mouth at bedtime. 09/16/18  Yes Mikhail, [place], DO  ferrous sulfate 325 (65 FE) MG tablet Take 957 mg by mouth daily with breakfast.    Yes [provider]  furosemide (LASIX) 40 MG tablet Take 1 tablet (40 mg total) by mouth daily  as needed. Patient taking differently: Take 40 mg by mouth every other day.  01/29/18  Yes [name], PA-C  hydrALAZINE (APRESOLINE) 100 MG tablet Take 1 tablet (100 mg total) by mouth 3 (three) times daily. Patient taking differently: Take 50 mg by mouth 3 (three) times daily.  11/16/18  Yes Skeet Latch, MD  insulin glargine (LANTUS) 100 UNIT/ML injection Inject 0.25 mLs (25 Units total) into the skin 2 (two) times daily. Patient taking differently: Inject 20 Units into the skin at bedtime.  01/03/17  Yes Carlota Raspberry, Tiffany, PA-C  isosorbide mononitrate (IMDUR) 120 MG 24 hr tablet Take 1 tablet (120 mg total) by mouth daily. Patient taking differently: Take 30 mg by mouth 3 (three) times daily.  01/21/19  Yes Bensimhon, Shaune Pascal, MD  losartan (COZAAR) 100 MG tablet Take 100 mg by mouth daily.   Yes [provider]  nitroGLYCERIN (NITROSTAT) 0.4 MG SL tablet Place 1 tablet (0.4 mg total) under the tongue every 5 (five) minutes x 3 doses as needed for chest pain. 08/13/15  Yes Theora Gianotti, NP  sodium bicarbonate 650 MG tablet Take 1,300 mg by mouth 2 (two) times daily.   Yes [provider]  sodium polystyrene (KAYEXALATE) 15 GM/60ML suspension Take 7.5 mg by mouth once.   Yes [provider]    Inpatient medications: . [START ON 02/27/2019] aspirin  81 mg Oral Daily  . [START ON 02/27/2019] atorvastatin  80 mg Oral Daily  . [START ON 02/27/2019] ferrous sulfate  975 mg Oral Q breakfast  . furosemide  60 mg Intravenous BID  . heparin  5,000 Units Subcutaneous Q8H  . hydrALAZINE  50 mg Oral TID  . insulin glargine  20 Units Subcutaneous QHS  . isosorbide mononitrate  30 mg Oral TID  . [START  ON 02/27/2019] losartan  100 mg Oral Daily  . sodium bicarbonate  1,300 mg Oral BID  . sodium chloride flush  3 mL Intravenous Q12H  . Vitamin D-3  4,000 Units Oral Daily    Discontinued Meds:   Medications Discontinued During This Encounter   Medication Reason  . furosemide (LASIX) injection 20 mg   . potassium chloride SA (K-DUR) CR tablet 20 mEq   . ticagrelor (BRILINTA) 90 MG TABS tablet   . isosorbide mononitrate (IMDUR) 120 MG 24 hr tablet   . carvedilol (COREG) 12.5 MG tablet   . amLODipine (NORVASC) tablet 10 mg   . doxazosin (CARDURA) tablet 1 mg     Social History:  reports that she quit smoking about 7 years ago. Her smoking use included cigarettes. She has a 10.00 pack-year smoking history. She has never used smokeless tobacco. She reports that she does not drink alcohol or use drugs.  Family History:   Family History  Problem Relation Age of Onset  . Cancer Mother        breast  . Diabetes Father   . Cancer Brother   . Heart attack Maternal Grandmother   . Heart attack Maternal Grandfather     Pertinent items are noted in HPI. Weight change:  No intake or output data in the 24 hours ending 02/26/19 2058 BP 113/69   Pulse 98   Temp 98.7 F (37.1 C) (Oral)   Resp (!) 23   SpO2 93%  Vitals:   02/26/19 1414 02/26/19 1430 02/26/19 1500 02/26/19 2020  BP: 126/88 117/69 113/69   Pulse: (!) 109 98 98   Resp: (!) 31 (!) 23 (!) 23   Temp: 98 F (36.7 C)   98.7 F (37.1 C)  TempSrc: Oral   Oral  SpO2: 92% 93% 93%      Direct physical contact was not performed due to concern over covid-19 as her test is currently pending.  exam documented by Dr. Evangeline Gula was used to inform clinical judgement  Labs: Basic Metabolic Panel: Recent Labs  Lab 02/26/19 1445  NA 143  K 4.3  CL 111  CO2 20*  GLUCOSE 108*  BUN 66*  CREATININE 5.00*  ALBUMIN 3.4*  CALCIUM 9.0   Liver Function Tests: Recent Labs  Lab 02/26/19 1445  AST 25  ALT 20  ALKPHOS 103  BILITOT 1.1  PROT 6.8  ALBUMIN 3.4*   No results for input(s): LIPASE, AMYLASE in the last 168 hours. No results for input(s): AMMONIA in the last 168 hours. CBC: Recent Labs  Lab 02/26/19 1445  WBC 8.3  NEUTROABS 5.8  HGB 8.1*  HCT 27.3*  MCV  88.3  PLT 234   PT/INR: @LABRCNTIP (inr:5) Cardiac Enzymes: ) Recent Labs  Lab 02/26/19 1445  TROPONINI 0.16*   CBG: No results for input(s): GLUCAP in the last 168 hours.  Iron Studies: No results for input(s): IRON, TIBC, TRANSFERRIN, FERRITIN in the last 168 hours.  Xrays/Other Studies: Dg Chest Portable 1 View  Result Date: 02/26/2019 CLINICAL DATA:  Hypoxia, acute dyspnea. EXAM: PORTABLE CHEST 1 VIEW COMPARISON:  Radiograph of September 14, 2018. FINDINGS: Stable cardiomediastinal silhouette. Moderate bilateral pleural effusions are noted with associated atelectasis or edema. No pneumothorax is noted. Bony thorax is unremarkable. IMPRESSION: Interval development of moderate pleural effusions with associated atelectasis or edema. Electronically Signed   By: Marijo Conception M.D.   On: 02/26/2019 14:56   Vas [place] Lower Extremity Venous (dvt) (mc And Wl 7a-7p)  Result Date: 02/26/2019  Lower Venous Study Indications: Edema.  Risk Factors: CHF. Limitations: Poor ultrasound/tissue interface. Comparison Study: No recent studies. Performing Technologist: Oliver Hum RVT  Examination Guidelines: A complete evaluation includes B-mode imaging, spectral Doppler, color Doppler, and power Doppler as needed of all accessible portions of each vessel. Bilateral testing is considered an integral part of a complete examination. Limited examinations for reoccurring indications may be performed as noted.  +-----+---------------+---------+-----------+----------+-------+ RIGHTCompressibilityPhasicitySpontaneityPropertiesSummary +-----+---------------+---------+-----------+----------+-------+ CFV  Full           Yes      Yes                          +-----+---------------+---------+-----------+----------+-------+   +---------+---------------+---------+-----------+----------+-------+ LEFT     CompressibilityPhasicitySpontaneityPropertiesSummary  +---------+---------------+---------+-----------+----------+-------+ CFV      Full           Yes      Yes                          +---------+---------------+---------+-----------+----------+-------+ SFJ      Full                                                 +---------+---------------+---------+-----------+----------+-------+ FV Prox  Full                                                 +---------+---------------+---------+-----------+----------+-------+ FV Mid   Full                                                 +---------+---------------+---------+-----------+----------+-------+ FV DistalFull                                                 +---------+---------------+---------+-----------+----------+-------+ PFV      Full                                                 +---------+---------------+---------+-----------+----------+-------+ POP      Full           Yes      Yes                          +---------+---------------+---------+-----------+----------+-------+ PTV      Full                                                 +---------+---------------+---------+-----------+----------+-------+ PERO     Full                                                 +---------+---------------+---------+-----------+----------+-------+  Summary: Right: No evidence of common femoral vein obstruction. Left: There is no evidence of deep vein thrombosis in the lower extremity. No cystic structure found in the popliteal fossa.  *See table(s) above for measurements and observations. Electronically signed by Harold Barban MD on 02/26/2019 at 100:19:35 PM.    Final      Assessment/Plan: 1.  AKI/CKD stage 4 vs progressive CKD (no Cr levels for the past 6 months available for review) in setting of acute on chronic combined diastolic and systolic CHF.  Agree with IV lasix and follow renal function.   1. Will need to contact Dr. Janann August office in the morning for  baseline Cr levels. 2. Hold ARB and follow. 2. Acute hypoxic respiratory distress- due to CHF.  Continue with O2 via [place] and diuresis. 3. Acute on chronic combined diastolic and systolic CHF- as above. CXR with pleural effusions.  Follow after IV lasix. 4. Anemia of CKD- will check iron stores and will likely require ESA 5. CAD- currently stable, no CP 6. DM- per primary svc 7. HLD- cont with lipitor   Donetta Potts 02/26/2019, 8:58 PM

## 2019-02-26 NOTE — Progress Notes (Signed)
Left lower extremity venous duplex has been completed. Preliminary results can be found in CV Proc through chart review.  Results were given to [facility] PA.  02/26/19 5:10 PM Carlos Levering RVT

## 2019-02-26 NOTE — ED Notes (Signed)
ED Provider at bedside. 

## 2019-02-26 NOTE — ED Notes (Signed)
ED TO INPATIENT HANDOFF REPORT  ED Nurse Name and Phone #: Ovidio Hanger  S Name/Age/Gender Jenna Brown 68 y.o. female Room/Bed: 056C/056C  Code Status   Code Status: Full Code  Home/SNF/Other Home Patient oriented to: self, place, time and situation Is this baseline? Yes   Triage Complete: Triage complete  Chief Complaint resp distress  Triage Note Pt from home with ems for c.o shortness of breath since Sunday. Pt 84% on room air, 3L [place] applied and pt increased to 94%. Pt also having some chest pain. Pt arrives alert and oriented, no resp distress noted at this time. VSS   Allergies Allergies  Allergen Reactions  . Plavix [Clopidogrel Bisulfate] Itching  . Codeine Other (See Comments)    "makes me feel strange"  . 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 Anemia   . Anxiety    takes Xanax prn  . Arthritis   . CKD (chronic kidney disease), stage IV ([place])   . Coronary artery disease 2016   cath w/ 90% LAD, 95%CFX, 80% OM 2, 60% RCA, not CABG candidate, rx medically  . GERD (gastroesophageal reflux disease)    . H/O hiatal hernia   . Headache(784.0)    when b/p is elevated  . Hemorrhoids   . Hx of AKA (above knee amputation), right ([place])   . Hyperlipidemia    takes Simvastatin daily  . Hypertension    takes Amlodipine/HCTZ/Losartan daily  . Migraines   . Myocardial infarction ([place]) 1990's  . NSTEMI (non-ST elevated myocardial infarction) ([place])   . Peripheral vascular disease ([place])   . PONV (postoperative nausea and vomiting)   . Stroke [facility])    TIA history  . Type II diabetes mellitus (HCC)    on Lantus  . Vertigo    takes Ativert prn   Past Surgical History:  Procedure Laterality Date  . ABDOMINAL AORTAGRAM N/A 02/29/2012   Procedure: ABDOMINAL Maxcine Ham;  Surgeon: [name], MD;  Location: [facility] CATH LAB;  Service: Cardiovascular;  Laterality: N/A;  . AMPUTATION  05/10/2012   Procedure: AMPUTATION ABOVE KNEE;  Surgeon: [name], MD;  Location: [facility] OR;  Service: Vascular;  Laterality: Right;   open right groin wound noted  . aortogram  02/2012  . CARDIAC CATHETERIZATION N/A 04/13/2015   Procedure: Right/Left Heart Cath and Coronary Angiography;  Surgeon: [name], MD;  Location: [place] CV LAB;  Service: Cardiovascular;  Laterality: N/A;  . CLEFT PALATE REPAIR     several  . COLONOSCOPY    . DILATION AND CURETTAGE OF UTERUS    . FEMORAL-POPLITEAL BYPASS GRAFT  04/05/2012  Procedure: BYPASS GRAFT FEMORAL-POPLITEAL ARTERY;  Surgeon: [name], MD;  Location: [facility];  Service: Vascular;  Laterality: Right;  REVISION  . FEMORAL-TIBIAL BYPASS GRAFT  03/29/2012   Procedure: BYPASS GRAFT FEMORAL-TIBIAL ARTERY;  Surgeon: [name], MD;  Location: [facility] OR;  Service: Vascular;  Laterality: Right;  Right femoral to Posterior Tibialis with composite graft of 53mm x 80 cm ringed gortex graft and saphenous vein  ,intraoperative arteriogram  . FEMOROPOPLITEAL THROMBECTOMY / EMBOLECTOMY  05/09/2012  . MYOMECTOMY    . PR VEIN BYPASS GRAFT,AORTO-FEM-POP  05/27/11   Right  SFA-Below knee Pop BP  . TEE WITHOUT CARDIOVERSION N/A 01/03/2017   Procedure: TRANSESOPHAGEAL ECHOCARDIOGRAM (TEE);  Surgeon: Skeet Latch, MD;  Location: [facility] ENDOSCOPY;  Service: Cardiovascular;  Laterality: N/A;  . TONSILLECTOMY    . TUBAL LIGATION  ~ 1990     A IV Location/Drains/Wounds Patient Lines/Drains/Airways Status   Active Line/Drains/Airways    Name:   Placement date:   Placement time:   Site:   Days:   Peripheral IV 02/26/19 Right Antecubital   02/26/19    1426    Antecubital   less than 1          Intake/Output Last 24 hours No intake or output data in the 24 hours ending 02/26/19 2012  Labs/Imaging Results for orders placed or performed during the hospital encounter of 02/26/19 (from the past 48 hour(s))  CBC with Differential     Status: Abnormal   Collection Time: 02/26/19  2:45 PM  Result Value Ref Range   WBC 8.3 4.0 - 10.5 K/uL   RBC 3.09 (L) 3.87 - 5.11 MIL/uL   Hemoglobin 8.1 (L) 12.0 - 15.0 g/dL   HCT 27.3 (L) 36.0 - 46.0 %   MCV 88.3 80.0 - 100.0 fL   MCH 26.2 26.0 - 34.0 pg   MCHC 29.7 (L) 30.0 - 36.0 g/dL   RDW 14.1 11.5 - 15.5 %   Platelets 234 150 - 400 K/uL   nRBC 0.0 0.0 - 0.2 %   Neutrophils Relative % 69 %   Neutro Abs 5.8 1.7 - 7.7 K/uL   Lymphocytes Relative 21 %   Lymphs Abs 1.7 0.7 - 4.0 K/uL   Monocytes Relative 8 %   Monocytes Absolute 0.7 0.1 - 1.0 K/uL   Eosinophils Relative 1 %   Eosinophils Absolute 0.1 0.0 - 0.5 K/uL   Basophils Relative 0 %   Basophils Absolute 0.0 0.0 - 0.1 K/uL   Immature Granulocytes 1 %   Abs Immature Granulocytes 0.04 0.00 - 0.07 K/uL    Comment: Performed at [facility], [address] [place], [place] [address]  Comprehensive metabolic panel     Status: Abnormal   Collection Time: 02/26/19  2:45 PM  Result Value Ref Range   Sodium 143 135 - 145 mmol/L   Potassium 4.3 3.5 - 5.1 mmol/L    Comment: SLIGHT HEMOLYSIS   Chloride 111 98 - 111 mmol/L   CO2 20 (L) 22 - 32 mmol/L   Glucose,  Bld 108 (H) 70 - 99 mg/dL   BUN 66 (H) 8 - 23 mg/dL   Creatinine, Ser 5.00 (H) 0.44 - 1.00 mg/dL   Calcium 9.0 8.9 - 10.3 mg/dL   Total Protein 6.8 6.5 - 8.1 g/dL   Albumin 3.4 (L) 3.5 - 5.0 g/dL   AST 25 15 - 41 U/L   ALT 20 0 - 44 U/L   Alkaline Phosphatase 103 38 -  126 U/L   Total Bilirubin 1.1 0.3 - 1.2 mg/dL   GFR calc non Af Amer 8 (L) >60 mL/min   GFR calc Af Amer 10 (L) >60 mL/min   Anion gap 12 5 - 15    Comment: Performed at [facility] [address], [place], [place] [address]  Brain natriuretic peptide     Status: Abnormal   Collection Time: 02/26/19  2:45 PM  Result Value Ref Range   B Natriuretic Peptide 1,190.5 (H) 0.0 - 100.0 pg/mL    Comment: Performed at [facility] [address], [place], [place] [address]  Troponin I - Once     Status: Abnormal   Collection Time: 02/26/19  2:45 PM  Result Value Ref Range   Troponin I 0.16 (HH) <0.03 ng/mL    Comment: CRITICAL RESULT CALLED TO, READ BACK BY AND VERIFIED WITH: Donnie Mesa 1030 02/26/2019 WBOND Performed at [facility], [place] [address] [place], [place] [address]    Dg Chest Portable 1 View  Result Date: 02/26/2019 CLINICAL DATA:  Hypoxia, acute dyspnea. EXAM: PORTABLE CHEST 1 VIEW COMPARISON:  Radiograph of September 14, 2018. FINDINGS: Stable cardiomediastinal silhouette. Moderate bilateral pleural effusions are noted with associated atelectasis or edema. No pneumothorax is noted. Bony thorax is unremarkable. IMPRESSION: Interval development of moderate pleural effusions with associated atelectasis or edema. Electronically Signed   By: Marijo Conception M.D.   On: 02/26/2019 14:56   Vas [place] Lower Extremity Venous (dvt) (mc And Wl 7a-7p)  Result Date: 02/26/2019  Lower Venous Study Indications: Edema.  Risk Factors: CHF. Limitations: Poor ultrasound/tissue interface. Comparison Study: No recent studies. Performing Technologist: Oliver Hum RVT  Examination Guidelines: A complete evaluation  includes B-mode imaging, spectral Doppler, color Doppler, and power Doppler as needed of all accessible portions of each vessel. Bilateral testing is considered an integral part of a complete examination. Limited examinations for reoccurring indications may be performed as noted.  +-----+---------------+---------+-----------+----------+-------+ RIGHTCompressibilityPhasicitySpontaneityPropertiesSummary +-----+---------------+---------+-----------+----------+-------+ CFV  Full           Yes      Yes                          +-----+---------------+---------+-----------+----------+-------+   +---------+---------------+---------+-----------+----------+-------+ LEFT     CompressibilityPhasicitySpontaneityPropertiesSummary +---------+---------------+---------+-----------+----------+-------+ CFV      Full           Yes      Yes                          +---------+---------------+---------+-----------+----------+-------+ SFJ      Full                                                 +---------+---------------+---------+-----------+----------+-------+ FV Prox  Full                                                 +---------+---------------+---------+-----------+----------+-------+ FV Mid   Full                                                 +---------+---------------+---------+-----------+----------+-------+  FV DistalFull                                                 +---------+---------------+---------+-----------+----------+-------+ PFV      Full                                                 +---------+---------------+---------+-----------+----------+-------+ POP      Full           Yes      Yes                          +---------+---------------+---------+-----------+----------+-------+ PTV      Full                                                 +---------+---------------+---------+-----------+----------+-------+ PERO     Full                                                  +---------+---------------+---------+-----------+----------+-------+     Summary: Right: No evidence of common femoral vein obstruction. Left: There is no evidence of deep vein thrombosis in the lower extremity. No cystic structure found in the popliteal fossa.  *See table(s) above for measurements and observations. Electronically signed by Harold Barban MD on 02/26/2019 at 6:19:35 PM.    Final     Pending Labs Unresulted Labs (From admission, onward)    Start     Ordered   02/27/19 4332  Basic metabolic panel  Daily,   R     02/26/19 1819   02/26/19 1815  HIV antibody (Routine Testing)  Once,   R     02/26/19 1819   02/26/19 1815  CBC  (heparin)  Once,   R    Comments:  Baseline for heparin therapy IF NOT ALREADY DRAWN.  Notify MD if PLT < 100 K.    02/26/19 1819   02/26/19 1815  Creatinine, serum  (heparin)  Once,   R    Comments:  Baseline for heparin therapy IF NOT ALREADY DRAWN.    02/26/19 1819   02/26/19 1707  Novel Coronavirus, NAA (hospital order; send-out to ref lab)  Once,   R    Comments:  No isolation needed for this testing (if isolation ordered for another indication, maintain current isolation).   Question:  Required for discharge to:  Answer:  SNF/facility placement   02/26/19 1706          Vitals/Pain Today's Vitals   02/26/19 1414 02/26/19 1430 02/26/19 1500 02/26/19 1749  BP: 126/88 117/69 113/69   Pulse: (!) 109 98 98   Resp: (!) 31 (!) 23 (!) 23   Temp: 98 F (36.7 C)     TempSrc: Oral     SpO2: 92% 93% 93%   PainSc:    0-No pain    Isolation Precautions No active isolations  Medications Medications  aspirin chewable tablet 81  mg (has no administration in time range)  atorvastatin (LIPITOR) tablet 80 mg (has no administration in time range)  hydrALAZINE (APRESOLINE) tablet 50 mg (has no administration in time range)  isosorbide mononitrate (IMDUR) 24 hr tablet 30 mg (has no administration in time range)   losartan (COZAAR) tablet 100 mg (has no administration in time range)  nitroGLYCERIN (NITROSTAT) SL tablet 0.4 mg (has no administration in time range)  ALPRAZolam (XANAX) tablet 0.25 mg (has no administration in time range)  insulin glargine (LANTUS) injection 20 Units (has no administration in time range)  sodium bicarbonate tablet 1,300 mg (has no administration in time range)  ferrous sulfate tablet 975 mg (has no administration in time range)  Vitamin D-3 TABS 4,000 Units (has no administration in time range)  sodium chloride flush (NS) 0.9 % injection 3 mL (has no administration in time range)  sodium chloride flush (NS) 0.9 % injection 3 mL (has no administration in time range)  0.9 %  sodium chloride infusion (has no administration in time range)  acetaminophen (TYLENOL) tablet 650 mg (has no administration in time range)  ondansetron (ZOFRAN) injection 4 mg (has no administration in time range)  heparin injection 5,000 Units (has no administration in time range)  furosemide (LASIX) injection 60 mg (60 mg Intravenous Not Given 02/26/19 1950)  zolpidem (AMBIEN) tablet 5 mg (has no administration in time range)  furosemide (LASIX) injection 60 mg (60 mg Intravenous Given 02/26/19 1742)  potassium chloride SA (K-DUR) CR tablet 40 mEq (40 mEq Oral Given 02/26/19 1740)    Mobility manual wheelchair High fall risk   Focused Assessments Pulmonary Assessment Handoff:  Lung sounds: Bilateral Breath Sounds: Diminished O2 Device: Nasal Cannula O2 Flow Rate (L/min): 4 L/min      R Recommendations: See Admitting Provider Note  Report given to:   Additional Notes:

## 2019-02-27 ENCOUNTER — Inpatient Hospital Stay (HOSPITAL_COMMUNITY): Payer: Medicare Other

## 2019-02-27 ENCOUNTER — Encounter (HOSPITAL_COMMUNITY): Payer: Self-pay | Admitting: Acute Care

## 2019-02-27 DIAGNOSIS — I2510 Atherosclerotic heart disease of native coronary artery without angina pectoris: Secondary | ICD-10-CM

## 2019-02-27 DIAGNOSIS — N171 Acute kidney failure with acute cortical necrosis: Secondary | ICD-10-CM

## 2019-02-27 DIAGNOSIS — I2583 Coronary atherosclerosis due to lipid rich plaque: Secondary | ICD-10-CM

## 2019-02-27 DIAGNOSIS — I509 Heart failure, unspecified: Secondary | ICD-10-CM

## 2019-02-27 DIAGNOSIS — J90 Pleural effusion, not elsewhere classified: Secondary | ICD-10-CM

## 2019-02-27 DIAGNOSIS — I255 Ischemic cardiomyopathy: Secondary | ICD-10-CM

## 2019-02-27 DIAGNOSIS — E785 Hyperlipidemia, unspecified: Secondary | ICD-10-CM

## 2019-02-27 DIAGNOSIS — N185 Chronic kidney disease, stage 5: Secondary | ICD-10-CM

## 2019-02-27 DIAGNOSIS — I5043 Acute on chronic combined systolic (congestive) and diastolic (congestive) heart failure: Secondary | ICD-10-CM

## 2019-02-27 DIAGNOSIS — I5021 Acute systolic (congestive) heart failure: Secondary | ICD-10-CM

## 2019-02-27 DIAGNOSIS — I132 Hypertensive heart and chronic kidney disease with heart failure and with stage 5 chronic kidney disease, or end stage renal disease: Principal | ICD-10-CM

## 2019-02-27 DIAGNOSIS — J9601 Acute respiratory failure with hypoxia: Secondary | ICD-10-CM

## 2019-02-27 HISTORY — PX: THORACENTESIS: SHX235

## 2019-02-27 LAB — RENAL FUNCTION PANEL
Albumin: 3 g/dL — ABNORMAL LOW (ref 3.5–5.0)
Anion gap: 9 (ref 5–15)
BUN: 67 mg/dL — ABNORMAL HIGH (ref 8–23)
CO2: 23 mmol/L (ref 22–32)
Calcium: 8.5 mg/dL — ABNORMAL LOW (ref 8.9–10.3)
Chloride: 110 mmol/L (ref 98–111)
Creatinine, Ser: 5.03 mg/dL — ABNORMAL HIGH (ref 0.44–1.00)
GFR calc Af Amer: 10 mL/min — ABNORMAL LOW (ref >60)
GFR calc non Af Amer: 8 mL/min — ABNORMAL LOW (ref >60)
Glucose, Bld: 190 mg/dL — ABNORMAL HIGH (ref 70–99)
Phosphorus: 4.9 mg/dL — ABNORMAL HIGH (ref 2.5–4.6)
Potassium: 4.8 mmol/L (ref 3.5–5.1)
Sodium: 142 mmol/L (ref 135–145)

## 2019-02-27 LAB — GLUCOSE, CAPILLARY
Glucose-Capillary: 142 mg/dL — ABNORMAL HIGH (ref 70–99)
Glucose-Capillary: 151 mg/dL — ABNORMAL HIGH (ref 70–99)
Glucose-Capillary: 108 mg/dL — ABNORMAL HIGH (ref 70–99)
Glucose-Capillary: 186 mg/dL — ABNORMAL HIGH (ref 70–99)

## 2019-02-27 LAB — NOVEL CORONAVIRUS, NAA (HOSP ORDER, SEND-OUT TO REF LAB; TAT 18-24 HRS): SARS-CoV-2, NAA: NOT DETECTED

## 2019-02-27 LAB — HIV ANTIBODY (ROUTINE TESTING W REFLEX): HIV Screen 4th Generation wRfx: NONREACTIVE

## 2019-02-27 MED ORDER — SODIUM CHLORIDE 0.9 % IV SOLN
INTRAVENOUS | Status: DC | PRN
  Administered 2019-02-27: 250 mL via INTRAVENOUS

## 2019-02-27 MED ORDER — INSULIN ASPART 100 UNIT/ML ~~LOC~~ SOLN
0.0000 [IU] | Freq: Every day | SUBCUTANEOUS | Status: DC
  Administered 2019-03-02 – 2019-03-04 (×2): 2 [IU] via SUBCUTANEOUS

## 2019-02-27 MED ORDER — HYDRALAZINE HCL 25 MG PO TABS
25.0000 mg | ORAL_TABLET | Freq: Three times a day (TID) | ORAL | Status: DC
  Administered 2019-02-27 – 2019-03-01 (×6): 25 mg via ORAL
  Filled 2019-02-27 (×7): qty 1

## 2019-02-27 MED ORDER — CARVEDILOL 3.125 MG PO TABS
3.1250 mg | ORAL_TABLET | Freq: Two times a day (BID) | ORAL | Status: DC
  Administered 2019-02-27 – 2019-03-02 (×7): 3.125 mg via ORAL
  Filled 2019-02-27 (×9): qty 1

## 2019-02-27 MED ORDER — BENZONATATE 100 MG PO CAPS
100.0000 mg | ORAL_CAPSULE | Freq: Three times a day (TID) | ORAL | Status: DC | PRN
  Administered 2019-02-27 – 2019-03-03 (×6): 100 mg via ORAL
  Filled 2019-02-27 (×7): qty 1

## 2019-02-27 MED ORDER — FUROSEMIDE 10 MG/ML IJ SOLN
160.0000 mg | Freq: Two times a day (BID) | INTRAVENOUS | Status: DC
  Administered 2019-02-27 – 2019-03-02 (×6): 160 mg via INTRAVENOUS
  Filled 2019-02-27 (×2): qty 16
  Filled 2019-02-27: qty 10
  Filled 2019-02-27 (×3): qty 16
  Filled 2019-02-27: qty 10
  Filled 2019-02-27 (×2): qty 16

## 2019-02-27 MED ORDER — INSULIN ASPART 100 UNIT/ML ~~LOC~~ SOLN
0.0000 [IU] | Freq: Three times a day (TID) | SUBCUTANEOUS | Status: DC
  Administered 2019-02-28: 3 [IU] via SUBCUTANEOUS
  Administered 2019-03-01: 2 [IU] via SUBCUTANEOUS
  Administered 2019-03-03: 1 [IU] via SUBCUTANEOUS
  Administered 2019-03-05 (×3): 2 [IU] via SUBCUTANEOUS

## 2019-02-27 NOTE — Progress Notes (Addendum)
Per Rosaria Ferries, NP patient will be NPO for for thoracentesis after procedure is completed verbal order given to resume previous diet after procedure. Renal/carb with 1200 FR placed back

## 2019-02-27 NOTE — Procedures (Signed)
Thoracentesis Procedure Note  Pre-operative Diagnosis:  Bilateral pleural effusions in setting of acute on chronic systolic and diastolic CHF  Post-operative Diagnosis: same, s/p Right sided thoracentesis   Indications: Symptomatic pleural effusions  Procedure Details  Consent: Informed consent was obtained. Risks of the procedure were discussed including: infection, bleeding, pain, pneumothorax.  Under sterile conditions the patient was positioned. CHD solution and sterile drapes were utilized.  2% lidocaine was used to anesthetize the Right lateral rib space as identified on beside ultrasonography. 1.2 L clear, serous fluid was obtained without any difficulties and minimal blood loss.  A dressing was applied to the wound and wound care instructions were provided.   Findings 1200 ml of clear pleural fluid was obtained from right pleural space.   Complications:  None; patient tolerated the procedure well.          Condition: stable  Plan A follow up chest x-ray was ordered. Bed Rest for 0 hours. Post-procedure pain management per primary team.   Eliseo Gum MSN, AGACNP-BC [place] [phone] If no answer, [phone] 02/27/2019, 2:48 PM

## 2019-02-27 NOTE — Progress Notes (Signed)
Inpatient Diabetes Program Recommendations  AACE/ADA: New Consensus Statement on Inpatient Glycemic Control (2015)  Target Ranges:  Prepandial:   less than 140 mg/dL      Peak postprandial:   less than 180 mg/dL (1-2 hours)      Critically ill patients:  140 - 180 mg/dL   Lab Results  Component Value Date   GLUCAP 142 (H) 02/27/2019   HGBA1C 5.9 (H) 09/15/2018    Review of Glycemic Control  Diabetes history: DM2 Outpatient Diabetes medications: Lantus 20 units QHS Current orders for Inpatient glycemic control: Lantus 20 units QHS  Last HgbA1C - 09/15/18 - 5.8%.  Inpatient Diabetes Program Recommendations:     Add Novolog 0-9 units tidwc  Will follow while inpatient.  Thank you. Lorenda Peck, RD, LDN, CDE Inpatient Diabetes Coordinator [phone]

## 2019-02-27 NOTE — Consult Note (Deleted)
The patient was seen, examined and discussed with Rosaria Ferries, PA-C and I agree with the above.   68 y.o. female with a hx of combined chronic systolic and diastolic heart failure, severe CAD, not a candidate for CABG, s/p R AKA, hypertension, DM, NSTEMI x 4, last in 04/2018, who is being seen today for the evaluation of acute on chronic CHF. The patient was nt compliant with low sodium diet and has gained 17 lbs in the last couple of months. On physical exam she appears to be in labored breathing has elevated JVDs, S1,2 and present S4, decreased BS up to half lungs B/L, 2+ pitting edema on her left leg, AKA on th right.  BNP 1190, Crea 5 from baseline 3.9, K 4.3, CXR shows large B/L pleural effusions.   Assessment and Plan: Acute on chronic combined CHF CAD Elevated troponin Hypertension Hyperlipidemia Anemia Acute on chronic kidney failure, stage V  We will order B/L thoracentesis, also increase iv lasix to 160 mg BID, follow crea closely. Medical therapy for CAD (ASA, statin, imdur), elevated troponin sec to CHF, acute on chronic kidney failure.   Ena Dawley, MD 02/27/2019

## 2019-02-27 NOTE — Consult Note (Addendum)
Cardiology Consultation:   Patient ID: Jenna Brown; [id number]; 07-22-1951   Admit date: 02/26/2019 Date of Consult: 02/27/2019  Primary Care Provider: [name], MD Primary Cardiologist: Skeet Latch, MD 11/16/2018 Advanced Heart Failure:  Glori Bickers, MD 08/01/2018 Primary Electrophysiologist:  None   Patient Profile:   Jenna Brown is a 68 y.o. female with a hx ofchronic systolic and diastolic heart failure, severe CAD, not a candidate for CABG, s/p R AKA, hypertension, DM, NSTEMI x 4, last in 04/2018, who is being seen today for the evaluation of CP, CHF, at the request of Dr Evangeline Gula.  History of Present Illness:   Jenna Brown was felt stable to f/u with regular cardiology at last CHF visit.  Dr Oval Linsey saw 02/28 and pt was taking an occasional nitro, occ LE edema but no orthopnea or PND, wt 154 lbs. Hydralazine increased, restart BB, left off d/c.  Jenna Brown states she has been compliant with a low-sodium diet and has not been drinking extra fluids.  She has had a poor appetite, has been eating crackers and popcorn, but says they are low-sodium.  She says the fluid just started coming on.  She does not know why.  She is compliant with all of her medications except Brilinta.  She quit taking the Brilinta about a month ago because she felt it was causing shortness of breath.  She feels that her shortness of breath improved for a while off the Brilinta, but has gotten worse again.  She describes orthopnea and PND.  She finally came to the hospital yesterday because she could not sleep at all because her breathing was so bad.  She took a nitro yesterday because of the shortness of breath and it seemed to help a little.  The last time she took a nitro for chest pain, was last month.  She has not had any recent chest pain.   Past Medical History:  Diagnosis Date   Anemia    Anxiety    takes Xanax prn   Arthritis    CKD (chronic kidney disease), stage IV  (HCC)    Coronary artery disease 2016   cath w/ 90% LAD, 95%CFX, 80% OM 2, 60% RCA, not CABG candidate, rx medically   GERD (gastroesophageal reflux disease)    H/O hiatal hernia    Headache(784.0)    when b/p is elevated   Hemorrhoids    Hx of AKA (above knee amputation), right (HCC)    Hyperlipidemia    takes Simvastatin daily   Hypertension    takes Amlodipine/HCTZ/Losartan daily   Migraines    Myocardial infarction [facility]) 1990's   NSTEMI (non-ST elevated myocardial infarction) ([place])    Peripheral vascular disease (HCC)    PONV (postoperative nausea and vomiting)    Stroke ([place])    TIA history   Type II diabetes mellitus (Jenna Brown)    on Lantus   Vertigo    takes Ativert prn    Past Surgical History:  Procedure Laterality Date   ABDOMINAL AORTAGRAM N/A 02/29/2012   Procedure: ABDOMINAL Maxcine Ham;  Surgeon: [name], MD;  Location: [facility] CATH LAB;  Service: Cardiovascular;  Laterality: N/A;   AMPUTATION  05/10/2012   Procedure: AMPUTATION ABOVE KNEE;  Surgeon: [name], MD;  Location: [facility] OR;  Service: Vascular;  Laterality: Right;   open right groin wound noted   aortogram  02/2012   CARDIAC CATHETERIZATION N/A 04/13/2015   Procedure: Right/Left Heart Cath and Coronary Angiography;  Surgeon: Pearletha Forge  [name], MD;  Location: [place] CV LAB;  Service: Cardiovascular;  Laterality: N/A;   CLEFT PALATE REPAIR     several   COLONOSCOPY     DILATION AND CURETTAGE OF UTERUS     FEMORAL-POPLITEAL BYPASS GRAFT  04/05/2012   Procedure: BYPASS GRAFT FEMORAL-POPLITEAL ARTERY;  Surgeon: [name], MD;  Location: [place];  Service: Vascular;  Laterality: Right;  REVISION   FEMORAL-TIBIAL BYPASS GRAFT  03/29/2012   Procedure: BYPASS GRAFT FEMORAL-TIBIAL ARTERY;  Surgeon: [name], MD;  Location: [place] OR;  Service: Vascular;  Laterality: Right;  Right femoral to Posterior Tibialis with composite graft of 38mm x 80 cm ringed gortex graft and saphenous  vein  ,intraoperative arteriogram   FEMOROPOPLITEAL THROMBECTOMY / EMBOLECTOMY  05/09/2012   MYOMECTOMY     PR VEIN BYPASS GRAFT,AORTO-FEM-POP  05/27/11   Right SFA-Below knee Pop BP   TEE WITHOUT CARDIOVERSION N/A 01/03/2017   Procedure: TRANSESOPHAGEAL ECHOCARDIOGRAM (TEE);  Surgeon: Skeet Latch, MD;  Location: [place];  Service: Cardiovascular;  Laterality: N/A;   TONSILLECTOMY     TUBAL LIGATION  ~ 1990     Prior to Admission medications   Medication Sig Start Date End Date Taking? Authorizing Provider  acetaminophen (TYLENOL) 500 MG tablet Take 500 mg by mouth every 6 (six) hours as needed for mild pain.   Yes [provider]  ALPRAZolam (XANAX) 0.25 MG tablet Take 1 tablet (0.25 mg total) by mouth 3 (three) times daily as needed for anxiety. Patient taking differently: Take 0.25 mg by mouth daily as needed for anxiety.  01/03/17  Yes Carlota Raspberry, Tiffany, PA-C  amLODipine (NORVASC) 10 MG tablet Take 1 tablet (10 mg total) by mouth daily. 11/16/18  Yes Skeet Latch, MD  aspirin 81 MG chewable tablet Chew 81 mg by mouth daily.   Yes [provider]  atorvastatin (LIPITOR) 80 MG tablet Take 80 mg by mouth daily.  01/09/18  Yes [provider]  Cholecalciferol (VITAMIN D-3 PO) Take 4,000 Units by mouth daily.    Yes [provider]  doxazosin (CARDURA) 1 MG tablet Take 1 tablet (1 mg total) by mouth at bedtime. 09/16/18  Yes Mikhail, [place], DO  ferrous sulfate 325 (65 FE) MG tablet Take 957 mg by mouth daily with breakfast.    Yes [provider]  furosemide (LASIX) 40 MG tablet Take 1 tablet (40 mg total) by mouth daily as needed. Patient taking differently: Take 40 mg by mouth every other day.  01/29/18  Yes [name], PA-C  hydrALAZINE (APRESOLINE) 100 MG tablet Take 1 tablet (100 mg total) by mouth 3 (three) times daily. Patient taking differently: Take 50 mg by mouth 3 (three) times daily.  11/16/18  Yes Skeet Latch, MD  insulin glargine (LANTUS) 100 UNIT/ML injection Inject 0.25 mLs (25 Units total) into the skin 2 (two) times daily. Patient taking differently: Inject 20 Units into the skin at bedtime.  01/03/17  Yes Carlota Raspberry, Tiffany, PA-C  isosorbide mononitrate (IMDUR) 120 MG 24 hr tablet Take 1 tablet (120 mg total) by mouth daily. Patient taking differently: Take 30 mg by mouth 3 (three) times daily.  01/21/19  Yes Bensimhon, Shaune Pascal, MD  losartan (COZAAR) 100 MG tablet Take 100 mg by mouth daily.   Yes [provider]  nitroGLYCERIN (NITROSTAT) 0.4 MG SL tablet Place 1 tablet (0.4 mg total) under the tongue every 5 (five) minutes x 3 doses as needed for chest pain. 08/13/15  Yes Sharolyn Douglas,  Clance Boll, NP  sodium bicarbonate 650 MG tablet Take 1,300 mg by mouth 2 (two) times daily.   Yes [provider]  sodium polystyrene (KAYEXALATE) 15 GM/60ML suspension Take 7.5 mg by mouth once.   Yes [provider]    Inpatient Medications: Scheduled Meds:  aspirin  81 mg Oral Daily   atorvastatin  80 mg Oral Daily   cholecalciferol  4,000 Units Oral Daily   ferrous sulfate  975 mg Oral Q breakfast   furosemide  60 mg Intravenous BID   heparin  5,000 Units Subcutaneous Q8H   hydrALAZINE  50 mg Oral Q8H   insulin glargine  20 Units Subcutaneous QHS   isosorbide mononitrate  30 mg Oral TID   sodium bicarbonate  1,300 mg Oral BID   sodium chloride flush  3 mL Intravenous Q12H   Continuous Infusions:  sodium chloride     PRN Meds: sodium chloride, acetaminophen, ALPRAZolam, benzonatate, nitroGLYCERIN, ondansetron (ZOFRAN) IV, sodium chloride flush, zolpidem  Allergies:    Allergies  Allergen Reactions   Plavix [Clopidogrel Bisulfate] Itching   Codeine Other (See Comments)    "makes me feel strange"   Tylenol With Codeine #3 [Acetaminophen-Codeine] Other (See Comments)    "doesn't make me feel right"    Social History:   Social History    Socioeconomic History   Marital status: Single    Spouse name: Not on file   Number of children: Not on file   Years of education: Not on file   Highest education level: Not on file  Occupational History   Occupation: disabled  Social Designer, fashion/clothing strain: Not on file   Food insecurity:    Worry: Not on file    Inability: Not on file   Transportation needs:    Medical: Not on file    Non-medical: Not on file  Tobacco Use   Smoking status: Former Smoker    Packs/day: 0.25    Years: 40.00    Pack years: 10.00    Types: Cigarettes    Last attempt to quit: 02/27/2012    Years since quitting: 7.0   Smokeless tobacco: Never Used  Substance and Sexual Activity   Alcohol use: No   Drug use: No   Sexual activity: Never    Birth control/protection: Post-menopausal  Lifestyle   Physical activity:    Days per week: Not on file    Minutes per session: Not on file   Stress: Not on file  Relationships   Social connections:    Talks on phone: Not on file    Gets together: Not on file    Attends religious service: Not on file    Active member of club or organization: Not on file    Attends meetings of clubs or organizations: Not on file    Relationship status: Not on file   Intimate partner violence:    Fear of current or ex partner: Not on file    Emotionally abused: Not on file    Physically abused: Not on file    Forced sexual activity: Not on file  Other Topics Concern   Not on file  Social History Narrative   She lives in [place] with family.    Family History:   Family History  Problem Relation Age of Onset   Cancer Mother        breast   Diabetes Father    Cancer Brother    Heart attack Maternal Grandmother  Heart attack Maternal Grandfather    Family Status:  Family Status  Relation Name Status   Mother  Deceased at age 46       Breast cancer   Father  Deceased at age 50       Diabetes   Brother  Deceased    MGM  Deceased   MGF  Deceased   PGM  Deceased   PGF  Deceased    ROS:  Please see the history of present illness.  All other ROS reviewed and negative.     Physical Exam/Data:   Vitals:   02/27/19 0002 02/27/19 0350 02/27/19 0622 02/27/19 0755  BP: 126/72 107/61 (!) 104/58 106/61  Pulse: (!) 110 92 91 89  Resp: 18 18  16   Temp: 98 F (36.7 C) 97.8 F (36.6 C)  98.3 F (36.8 C)  TempSrc: Oral Oral  Oral  SpO2: 90% 97%  91%  Weight:  77.8 kg    Height:        Intake/Output Summary (Last 24 hours) at 02/27/2019 1013 Last data filed at 02/27/2019 0909 Gross per 24 hour  Intake 840 ml  Output 150 ml  Net 690 ml   Filed Weights   02/26/19 2103 02/27/19 0350  Weight: 77.2 kg 77.8 kg   Body mass index is 29.44 kg/m.  General:  Well nourished, well developed, in no acute distress HEENT: normal Lymph: no adenopathy Neck: JVD 10 cm Endocrine:  No thryomegaly Vascular: No carotid bruits; upper extremity pulses 2+,  LLE pulse difficult to palpate because of edema, capillary refill within normal limits  Cardiac:  normal S1, S2; RRR; no murmur  Lungs: Significantly decreased breath sounds bases bilaterally, no wheezing, rhonchi, rales upper half Abd: soft, nontender, no hepatomegaly  Ext: 3+ edema Musculoskeletal:  No deformities, BUE and BLE strength normal and equal Skin: warm and dry  Neuro:  CNs 2-12 intact, no focal abnormalities noted Psych:  Normal affect   EKG:  The EKG was personally reviewed and demonstrates: 6/9 ECG is sinus tach, heart rate 109, diffuse ST/T wave changes, slightly different from 11/16/2018 Telemetry:  Telemetry was personally reviewed and demonstrates: Sinus rhythm with 5 beats of possible A. fib versus nonsustained VT  Relevant CV Studies:  TEE 01/03/17  45%, trivial MR and TR, no LA/LAA thrombus or mass, negative bubble study.   Echo 03/2017 Left ventricle: LVEF is approximately 45 to 50% with mild hypokinesis of the distal  inferior/inferoseptal and apical walls. The cavity size was normal. Wall thickness was increased in a pattern of mild LVH. Doppler parameters are consistent with abnormal left ventricular relaxation (grade 1 diastolic dysfunction). - Aortic valve: AV is thickened, calcified In the parasternal view there is an nodular echo bright segment (18 x 7 mm) May represent nodular thickening of valve Not convincing for discrete mass. It appears to be present on TTE in April 2018 TEE after that showed no abnormalitiy to AV. - Mitral valve: Calcified annulus. Mildly thickened leaflets  Echo 07/2018: Study Conclusions  - Left ventricle: The cavity size was normal. There was mild focal basal hypertrophy of the septum. Systolic function was mildly reduced. The estimated ejection fraction was in the range of 45% to 50%. Hypokinesis of the apicalinferolateral, inferior, inferoseptal, and apical myocardium. - Aortic valve: Trileaflet; mildly thickened, mildly calcified leaflets. Left coronary cusp mobility was moderately restricted. - Mitral valve: Calcified annulus. Mildly thickened leaflets . There was trivial regurgitation. - Right ventricle: The cavity size was mildly dilated.  Wall thickness was normal. Systolic function was low normal. - Atrial septum: No defect or patent foramen ovale was identified. - Tricuspid valve: There was no significant regurgitation. - Pulmonic valve: There was trivial regurgitation.  Impressions:  - EF using wall tracking approximately 45%. Hypokinesis of distal inferior, inferolateral, inferoseptal, and apical walls. Wall motion visually appears similar to prior. Thickened LCC of aortic valve appears similar to prior.  RHC/LHC 04/14/15  Prox LAD to Mid LAD lesion, 90% stenosed.  Ost Cx to Mid Cx lesion, 95% stenosed.  Ost 2nd Mrg to 2nd Mrg lesion, 80% stenosed.  Mid Cx lesion, 90% stenosed.  Prox RCA lesion, 60%  stenosed.  Mid RCA lesion, 50% stenosed.  Laboratory Data:  Chemistry Recent Labs  Lab 02/26/19 1445 02/26/19 2212 02/27/19 0325  NA 143  --  142  K 4.3  --  4.8  CL 111  --  110  CO2 20*  --  23  GLUCOSE 108*  --  190*  BUN 66*  --  67*  CREATININE 5.00* 5.03* 5.03*  CALCIUM 9.0  --  8.5*  GFRNONAA 8* 8* 8*  GFRAA 10* 10* 10*  ANIONGAP 12  --  9    Lab Results  Component Value Date   ALT 20 02/26/2019   AST 25 02/26/2019   ALKPHOS 103 02/26/2019   BILITOT 1.1 02/26/2019   Hematology Recent Labs  Lab 02/26/19 1445 02/26/19 2212  WBC 8.3 7.3  RBC 3.09* 2.95*  HGB 8.1* 7.9*  HCT 27.3* 26.1*  MCV 88.3 88.5  MCH 26.2 26.8  MCHC 29.7* 30.3  RDW 14.1 14.2  PLT 234 223   Cardiac Enzymes Recent Labs  Lab 02/26/19 1445  TROPONINI 0.16*     BNP Recent Labs  Lab 02/26/19 1445  BNP 1,190.5*     TSH:  Lab Results  Component Value Date   TSH 1.778 01/11/2018   Lipids: Lab Results  Component Value Date   CHOL 162 09/15/2018   HDL 45 09/15/2018   LDLCALC 107 (H) 09/15/2018   TRIG 51 09/15/2018   CHOLHDL 3.6 09/15/2018   HgbA1c: Lab Results  Component Value Date   HGBA1C 5.9 (H) 09/15/2018    Radiology/Studies:  Dg Chest Portable 1 View  Result Date: 02/26/2019 CLINICAL DATA:  Hypoxia, acute dyspnea. EXAM: PORTABLE CHEST 1 VIEW COMPARISON:  Radiograph of September 14, 2018. FINDINGS: Stable cardiomediastinal silhouette. Moderate bilateral pleural effusions are noted with associated atelectasis or edema. No pneumothorax is noted. Bony thorax is unremarkable. IMPRESSION: Interval development of moderate pleural effusions with associated atelectasis or edema. Electronically Signed   By: Marijo Conception M.D.   On: 02/26/2019 14:56   Vas [place] Lower Extremity Venous (dvt) (mc And Wl 7a-7p)  Result Date: 02/26/2019  Lower Venous Study Indications: Edema.  Risk Factors: CHF. Limitations: Poor ultrasound/tissue interface. Comparison Study: No recent studies.  Performing Technologist: Oliver Hum RVT  Examination Guidelines: A complete evaluation includes B-mode imaging, spectral Doppler, color Doppler, and power Doppler as needed of all accessible portions of each vessel. Bilateral testing is considered an integral part of a complete examination. Limited examinations for reoccurring indications may be performed as noted.  +-----+---------------+---------+-----------+----------+-------+  RIGHT Compressibility Phasicity Spontaneity Properties Summary  +-----+---------------+---------+-----------+----------+-------+  CFV   Full            Yes       Yes                             +-----+---------------+---------+-----------+----------+-------+   +---------+---------------+---------+-----------+----------+-------+  LEFT      Compressibility Phasicity Spontaneity Properties Summary  +---------+---------------+---------+-----------+----------+-------+  CFV       Full            Yes       Yes                             +---------+---------------+---------+-----------+----------+-------+  SFJ       Full                                                      +---------+---------------+---------+-----------+----------+-------+  FV Prox   Full                                                      +---------+---------------+---------+-----------+----------+-------+  FV Mid    Full                                                      +---------+---------------+---------+-----------+----------+-------+  FV Distal Full                                                      +---------+---------------+---------+-----------+----------+-------+  PFV       Full                                                      +---------+---------------+---------+-----------+----------+-------+  POP       Full            Yes       Yes                             +---------+---------------+---------+-----------+----------+-------+  PTV       Full                                                       +---------+---------------+---------+-----------+----------+-------+  PERO      Full                                                      +---------+---------------+---------+-----------+----------+-------+     Summary: Right: No evidence of common femoral vein obstruction. Left: There is no evidence of deep vein thrombosis in the lower extremity. No cystic structure found in the popliteal fossa.  *See table(s) above for measurements and observations.  Electronically signed by Harold Barban MD on 02/26/2019 at 27:19:35 PM.    Final     Assessment and Plan:   1.  ACute on chronic combined CHF -Has pleural effusions, review with MD to see if decubitus films needed -Nephrology has seen, discuss with MD if they need to manage the Lasix  2.  CAD: -During previous admission 08/2018, peak troponin 3.79, medical therapy. - Continue to cycle troponin, but plan is for medical therapy -Last echo 07/2018, MD advise if repeat is needed -Continue ASA and high-dose statin. - Currently beta-blocker is on hold, Imdur dose decreased - Restart beta-blocker at low-dose, decrease hydralazine to get beta-blocker on board  3.  Hypertension: -Prior to admission was on losartan 100 mg, Imdur 120 mg, hydralazine 100 mg 3 times daily, amlodipine 10 mg daily, Cardura 1 mg daily, Lasix 40 mg as needed -Was supposed to be on carvedilol 18.75 mg twice daily at her last discharge but it was left off her med list.  Dr. Oval Linsey wished to restart it but the patient was reluctant and is apparently not taking it - Currently on Lasix 60 mg IV twice daily, hydralazine 50 mg 3 times daily, Imdur 30 mg daily - Decrease hydralazine further and restart low-dose beta-blocker  4.  Hyperlipidemia: -Goal LDL less than 70, patient is on high-dose statin  5.  Anemia: - She has iron deficiency, per IM and Nephrology. - May need to keep hemoglobin 8 or better because of her CAD.  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   Signed, Rosaria Ferries, PA-C  02/27/2019 10:13 AM   The patient was seen, examined and discussed with Rosaria Ferries, PA-C and I agree with the above.   68 y.o.femalewith a hx of combined chronic systolic and diastolic heart failure, severe CAD, not a candidate for CABG, s/p R AKA, hypertension, DM, NSTEMI x 4, last in 04/2018,who is being seen today for the evaluation of acute on chronic CHF. The patient was nt compliant with low sodium diet and has gained 17 lbs in the last couple of months. On physical exam she appears to be in labored breathing has elevated JVDs, S1,2 and present S4, decreased BS up to half lungs B/L, 2+ pitting edema on her left leg, AKA on th right.  BNP 1190, Crea 5 from baseline 3.9, K 4.3, CXR shows large B/L pleural effusions.   Assessment and Plan: Acute on chronic combined CHF CAD Elevated troponin Hypertension Hyperlipidemia Anemia Acute on chronic kidney failure, stage V  We will order B/L thoracentesis, also increase iv lasix to 160 mg BID, follow crea closely. Medical therapy for CAD (ASA, statin, imdur), elevated troponin sec to CHF, acute on chronic kidney failure.   Ena Dawley, MD 02/27/2019

## 2019-02-27 NOTE — Progress Notes (Signed)
PROGRESS NOTE    TALLY MATTOX   WPY:[id number]  DOB: Sep 07, 1951  DOA: 02/26/2019 PCP: [name], MD   Brief Narrative:  Jenna Brown  is a 68 y.o. female with medical history significant of coronary artery disease congestive heart failure with an ejection fraction of 45 to 50% in November 2019 with associated ischemic cardiomyopathy and hypokinesis, prior stroke, anemia, anxiety, stage IV chronic kidney disease, gastroesophageal reflux disease, hypertension, hyperlipidemia, and type 2 diabetes who presents emergency department complaining of worsening dyspnea x3 days. In ED, Cr noted to be 5 from 3.90 on 09/16/18 Troponin 2.38 CXR> moderate pleural effusions Admitted for acute CHF. Nephrology consulted.   Subjective: Feels a little less short of breath today. No complaints.     Assessment & Plan:   Principal Problem:   Acute on chronic systolic and diastolic congestive heart failure ,   Pleural effusion, bilateral   Acute respiratory failure with hypoxia - cont high dose lasix - ECHO 11/19> 45% to 50%. Hypokinesis of the apicalinferolateral, inferior,   inferoseptal, and apical myocardium. - cardiology consulted- they plan to order b/l thoracentesis  Active Problems: AKI on CKD 4- metabolic acidosis - related to above- appreciate nephro eval - on sodium bicarb at home which has been resumed  CAD s/p CABG - she stopped her Brillinta about 1 mo ago due to dyspnea - con ASA, Lipitor, Coreg, Imdur, Hydralazine    Anxiety -cont Xanax PRN    Anemia likely of chronic disease  - anemia panel shows normal Ferretin, low dion levels but normal iron binding, normal folate    Insulin-requiring or dependent type II diabetes mellitus  -cont Lantus - start SSI   Time spent in minutes: 35 DVT prophylaxis:  Heparin Code Status: Full code Family Communication:  Disposition Plan: follow diuresis Consultants:   none Procedures:   none Antimicrobials:    Anti-infectives (From admission, onward)   None       Objective: Vitals:   02/27/19 0350 02/27/19 0622 02/27/19 0755 02/27/19 1136  BP: 107/61 (!) 104/58 106/61 119/68  Pulse: 92 91 89 94  Resp: 18  16 18   Temp: 97.8 F (36.6 C)  98.3 F (36.8 C) 98.8 F (37.1 C)  TempSrc: Oral  Oral Oral  SpO2: 97%  91% 94%  Weight: 77.8 kg     Height:        Intake/Output Summary (Last 24 hours) at 02/27/2019 1358 Last data filed at 02/27/2019 1141 Gross per 24 hour  Intake 840 ml  Output 250 ml  Net 590 ml   Filed Weights   02/26/19 2103 02/27/19 0350  Weight: 77.2 kg 77.8 kg    Examination: General exam: Appears comfortable  HEENT: PERRLA, oral mucosa moist, no sclera icterus or thrush Respiratory system: poor breath sounds at bases- crackles in mid lung fields- on 6 ZL Respiratory effort normal. Cardiovascular system: S1 & S2 heard, RRR.   Gastrointestinal system: Abdomen soft, non-tender, nondistended. Normal bowel sounds. Central nervous system: Alert and oriented. No focal neurological deficits. Extremities: No cyanosis, clubbing- + pitting edema left leg, R AKA Skin: No rashes or ulcers Psychiatry:  Mood & affect appropriate.     Data Reviewed: I have personally reviewed following labs and imaging studies  CBC: Recent Labs  Lab 02/26/19 1445 02/26/19 2212  WBC 8.3 7.3  NEUTROABS 5.8  --   HGB 8.1* 7.9*  HCT 27.3* 26.1*  MCV 88.3 88.5  PLT 234 053   Basic Metabolic Panel: Recent  Labs  Lab 02/26/19 1445 02/26/19 2212 02/27/19 0325  NA 143  --  142  K 4.3  --  4.8  CL 111  --  110  CO2 20*  --  23  GLUCOSE 108*  --  190*  BUN 66*  --  67*  CREATININE 5.00* 5.03* 5.03*  CALCIUM 9.0  --  8.5*  PHOS  --   --  4.9*   GFR: Estimated Creatinine Clearance: 11.2 mL/min (A) (by C-G formula based on SCr of 5.03 mg/dL (H)). Liver Function Tests: Recent Labs  Lab 02/26/19 1445 02/27/19 0325  AST 25  --   ALT 20  --   ALKPHOS 103  --   BILITOT 1.1  --    PROT 6.8  --   ALBUMIN 3.4* 3.0*   No results for input(s): LIPASE, AMYLASE in the last 168 hours. No results for input(s): AMMONIA in the last 168 hours. Coagulation Profile: No results for input(s): INR, PROTIME in the last 168 hours. Cardiac Enzymes: Recent Labs  Lab 02/26/19 1445  TROPONINI 0.16*   BNP (last 3 results) No results for input(s): PROBNP in the last 8760 hours. HbA1C: No results for input(s): HGBA1C in the last 72 hours. CBG: Recent Labs  Lab 02/26/19 2104 02/27/19 0617 02/27/19 1139  GLUCAP 121* 142* 151*   Lipid Profile: No results for input(s): CHOL, HDL, LDLCALC, TRIG, CHOLHDL, LDLDIRECT in the last 72 hours. Thyroid Function Tests: No results for input(s): TSH, T4TOTAL, FREET4, T3FREE, THYROIDAB in the last 72 hours. Anemia Panel: Recent Labs    02/26/19 2212  VITAMINB12 784  FERRITIN 78  TIBC 300  IRON 25*   Urine analysis:    Component Value Date/Time   COLORURINE YELLOW 09/14/2018 2330   APPEARANCEUR CLOUDY (A) 09/14/2018 2330   LABSPEC 1.014 09/14/2018 2330   PHURINE 7.0 09/14/2018 2330   GLUCOSEU 50 (A) 09/14/2018 2330   HGBUR NEGATIVE 09/14/2018 2330   BILIRUBINUR NEGATIVE 09/14/2018 2330   KETONESUR 5 (A) 09/14/2018 2330   PROTEINUR 100 (A) 09/14/2018 2330   UROBILINOGEN 0.2 04/10/2015 1248   NITRITE NEGATIVE 09/14/2018 2330   LEUKOCYTESUR TRACE (A) 09/14/2018 2330   Sepsis Labs: @LABRCNTIP (procalcitonin:4,lacticidven:4) )No results found for this or any previous visit (from the past 240 hour(s)).       Radiology Studies: Dg Chest Portable 1 View  Result Date: 02/26/2019 CLINICAL DATA:  Hypoxia, acute dyspnea. EXAM: PORTABLE CHEST 1 VIEW COMPARISON:  Radiograph of September 14, 2018. FINDINGS: Stable cardiomediastinal silhouette. Moderate bilateral pleural effusions are noted with associated atelectasis or edema. No pneumothorax is noted. Bony thorax is unremarkable. IMPRESSION: Interval development of moderate pleural  effusions with associated atelectasis or edema. Electronically Signed   By: Marijo Conception M.D.   On: 02/26/2019 14:56   Vas [place] Lower Extremity Venous (dvt) (mc And Wl 7a-7p)  Result Date: 02/26/2019  Lower Venous Study Indications: Edema.  Risk Factors: CHF. Limitations: Poor ultrasound/tissue interface. Comparison Study: No recent studies. Performing Technologist: Oliver Hum RVT  Examination Guidelines: A complete evaluation includes B-mode imaging, spectral Doppler, color Doppler, and power Doppler as needed of all accessible portions of each vessel. Bilateral testing is considered an integral part of a complete examination. Limited examinations for reoccurring indications may be performed as noted.  +-----+---------------+---------+-----------+----------+-------+  RIGHT Compressibility Phasicity Spontaneity Properties Summary  +-----+---------------+---------+-----------+----------+-------+  CFV   Full            Yes       Yes                             +-----+---------------+---------+-----------+----------+-------+   +---------+---------------+---------+-----------+----------+-------+  LEFT      Compressibility Phasicity Spontaneity Properties Summary  +---------+---------------+---------+-----------+----------+-------+  CFV       Full            Yes       Yes                             +---------+---------------+---------+-----------+----------+-------+  SFJ       Full                                                      +---------+---------------+---------+-----------+----------+-------+  FV Prox   Full                                                      +---------+---------------+---------+-----------+----------+-------+  FV Mid    Full                                                      +---------+---------------+---------+-----------+----------+-------+  FV Distal Full                                                      +---------+---------------+---------+-----------+----------+-------+   PFV       Full                                                      +---------+---------------+---------+-----------+----------+-------+  POP       Full            Yes       Yes                             +---------+---------------+---------+-----------+----------+-------+  PTV       Full                                                      +---------+---------------+---------+-----------+----------+-------+  PERO      Full                                                      +---------+---------------+---------+-----------+----------+-------+     Summary: Right: No evidence of common femoral vein obstruction. Left: There is no evidence of deep vein thrombosis in the lower extremity. No cystic structure found in the popliteal fossa.  *See table(s) above for measurements and observations.  Electronically signed by Harold Barban MD on 02/26/2019 at 54:19:35 PM.    Final       Scheduled Meds:  aspirin  81 mg Oral Daily   atorvastatin  80 mg Oral Daily   carvedilol  3.125 mg Oral BID WC   cholecalciferol  4,000 Units Oral Daily   ferrous sulfate  975 mg Oral Q breakfast   heparin  5,000 Units Subcutaneous Q8H   hydrALAZINE  25 mg Oral Q8H   insulin glargine  20 Units Subcutaneous QHS   isosorbide mononitrate  30 mg Oral TID   sodium bicarbonate  1,300 mg Oral BID   sodium chloride flush  3 mL Intravenous Q12H   Continuous Infusions:  sodium chloride     furosemide       LOS: 1 day      Debbe Odea, MD [facility] Pager: [email or web address] Password [email or web address] 02/27/2019, 1:58 PM

## 2019-02-27 NOTE — Progress Notes (Signed)
Patient with 5 beats of VT patient asymptomatic. Paged MD Rizwan and let her know.

## 2019-02-27 NOTE — Progress Notes (Signed)
Procedure completed, patient diet switched from NPO to Renal/carb diet per verbal order.

## 2019-02-27 NOTE — Progress Notes (Signed)
Patient having bedside thoracentesis done at bedside. Consent completed by physician and time-out completed. AC at bedside along with AD.

## 2019-02-28 ENCOUNTER — Inpatient Hospital Stay (HOSPITAL_COMMUNITY): Payer: Medicare Other

## 2019-02-28 DIAGNOSIS — N184 Chronic kidney disease, stage 4 (severe): Secondary | ICD-10-CM

## 2019-02-28 DIAGNOSIS — I739 Peripheral vascular disease, unspecified: Secondary | ICD-10-CM

## 2019-02-28 DIAGNOSIS — N186 End stage renal disease: Secondary | ICD-10-CM

## 2019-02-28 DIAGNOSIS — I2510 Atherosclerotic heart disease of native coronary artery without angina pectoris: Secondary | ICD-10-CM

## 2019-02-28 LAB — RENAL FUNCTION PANEL
Albumin: 2.9 g/dL — ABNORMAL LOW (ref 3.5–5.0)
Anion gap: 13 (ref 5–15)
BUN: 70 mg/dL — ABNORMAL HIGH (ref 8–23)
CO2: 22 mmol/L (ref 22–32)
Calcium: 8.7 mg/dL — ABNORMAL LOW (ref 8.9–10.3)
Chloride: 108 mmol/L (ref 98–111)
Creatinine, Ser: 5.56 mg/dL — ABNORMAL HIGH (ref 0.44–1.00)
GFR calc Af Amer: 9 mL/min — ABNORMAL LOW (ref >60)
GFR calc non Af Amer: 7 mL/min — ABNORMAL LOW (ref >60)
Glucose, Bld: 81 mg/dL (ref 70–99)
Phosphorus: 4.5 mg/dL (ref 2.5–4.6)
Potassium: 4.3 mmol/L (ref 3.5–5.1)
Sodium: 143 mmol/L (ref 135–145)

## 2019-02-28 LAB — GLUCOSE, CAPILLARY
Glucose-Capillary: 73 mg/dL (ref 70–99)
Glucose-Capillary: 154 mg/dL — ABNORMAL HIGH (ref 70–99)
Glucose-Capillary: 109 mg/dL — ABNORMAL HIGH (ref 70–99)
Glucose-Capillary: 192 mg/dL — ABNORMAL HIGH (ref 70–99)

## 2019-02-28 LAB — FOLATE RBC
Folate, Hemolysate: 318 ng/mL
Folate, RBC: 1187 ng/mL (ref >498)
Hematocrit: 26.8 % — ABNORMAL LOW (ref 34.0–46.6)

## 2019-02-28 MED ORDER — SODIUM CHLORIDE 0.9 % IV SOLN
510.0000 mg | Freq: Once | INTRAVENOUS | Status: AC
  Administered 2019-02-28: 510 mg via INTRAVENOUS
  Filled 2019-02-28: qty 17

## 2019-02-28 MED ORDER — CHLORHEXIDINE GLUCONATE CLOTH 2 % EX PADS
6.0000 | MEDICATED_PAD | Freq: Every day | CUTANEOUS | Status: DC
  Administered 2019-02-28 – 2019-03-06 (×6): 6 via TOPICAL

## 2019-02-28 MED ORDER — LIDOCAINE HCL 1 % IJ SOLN
INTRAMUSCULAR | Status: AC
  Filled 2019-02-28: qty 20

## 2019-02-28 NOTE — Progress Notes (Signed)
BP running on the lower end, pt asymptomatic. Will continue to monitor.

## 2019-02-28 NOTE — Progress Notes (Addendum)
PROGRESS NOTE    Jenna Brown   WSF:[id number]  DOB: [birth year]  DOA: 02/26/2019 PCP: [name], MD   Brief Narrative:  Jenna Brown  is a 68 y.o. female with medical history significant of coronary artery disease congestive heart failure with an ejection fraction of 45 to 50% in November 2019 with associated ischemic cardiomyopathy and hypokinesis, prior stroke, anemia, anxiety, stage IV chronic kidney disease, gastroesophageal reflux disease, hypertension, hyperlipidemia, and type 2 diabetes who presents emergency department complaining of worsening dyspnea x3 days. In ED, Cr noted to be 5 from 3.90 on 09/16/18 Troponin 2.38 CXR> moderate pleural effusions Admitted for acute CHF. Nephrology consulted.   Subjective:   No complaint today. Understands she needs to start dialysis.     Assessment & Plan:   Principal Problem:   Acute on chronic systolic and diastolic congestive heart failure ,   Pleural effusion, bilateral   Acute respiratory failure with hypoxia AKI on CKD 4-  - cont high dose lasix - ECHO 11/19> 45% to 50%. Hypokinesis of the apicalinferolateral, inferior,   inferoseptal, and apical myocardium. - cardiology consulted- they ordered a thoracentesis on 6/10- Right thora > 1. 2 L  - I spoke with Dr Augustin Coupe today who has decided to start dialysis- vascular surgery consulted- patient in agreement  Active Problems: Metabolic acidosis - related to above  - on sodium bicarb at home which has been resumed  CAD s/p CABG - she stopped her Brillinta about 1 mo ago due to dyspnea - con ASA, Lipitor, Coreg, Imdur, Hydralazine    Anxiety -cont Xanax PRN    Anemia likely of chronic disease  - anemia panel reviewed    Insulin-requiring or dependent type II diabetes mellitus  -cont Lantus - start SSI   Time spent in minutes: 35 DVT prophylaxis:  Heparin Code Status: Full code Family Communication:  Disposition Plan: follow diuresis Consultants:   none  Procedures:   none Antimicrobials:  Anti-infectives (From admission, onward)   None       Objective: Vitals:   02/28/19 0029 02/28/19 0529 02/28/19 0611 02/28/19 0827  BP: (!) 96/55 (!) 104/59  (!) 98/48  Pulse: 83 88  63  Resp: 17 16    Temp: 98.6 F (37 C) 98.4 F (36.9 C)    TempSrc: Oral Oral    SpO2: 92% (!) 84% 92% 100%  Weight:  76.3 kg    Height:        Intake/Output Summary (Last 24 hours) at 02/28/2019 1138 Last data filed at 02/28/2019 0903 Gross per 24 hour  Intake 1423.46 ml  Output 950 ml  Net 473.46 ml   Filed Weights   02/26/19 2103 02/27/19 0350 02/28/19 0529  Weight: 77.2 kg 77.8 kg 76.3 kg    Examination: General exam: Appears comfortable  HEENT: PERRLA, oral mucosa moist, no sclera icterus or thrush Respiratory system: crackles at bases- on Oxygen Respiratory effort normal. Cardiovascular system: S1 & S2 heard,  No murmurs  Gastrointestinal system: Abdomen soft, non-tender, nondistended. Normal bowel sounds   Central nervous system: Alert and oriented. No focal neurological deficits. Extremities: No cyanosis, clubbing - 2 + pitting edema- right AKA Skin: No rashes or ulcers Psychiatry:  Mood & affect appropriate.     Data Reviewed: I have personally reviewed following labs and imaging studies  CBC: Recent Labs  Lab 02/26/19 1445 02/26/19 2212  WBC 8.3 7.3  NEUTROABS 5.8  --   HGB 8.1* 7.9*  HCT 27.3* 26.1*  MCV 88.3 88.5  PLT 234 537   Basic Metabolic Panel: Recent Labs  Lab 02/26/19 1445 02/26/19 2212 02/27/19 0325 02/28/19 0556  NA 143  --  142 143  K 4.3  --  4.8 4.3  CL 111  --  110 108  CO2 20*  --  23 22  GLUCOSE 108*  --  190* 81  BUN 66*  --  67* 70*  CREATININE 5.00* 5.03* 5.03* 5.56*  CALCIUM 9.0  --  8.5* 8.7*  PHOS  --   --  4.9* 4.5   GFR: Estimated Creatinine Clearance: 10.1 mL/min (A) (by C-G formula based on SCr of 5.56 mg/dL (H)). Liver Function Tests: Recent Labs  Lab 02/26/19 1445 02/27/19  0325 02/28/19 0556  AST 25  --   --   ALT 20  --   --   ALKPHOS 103  --   --   BILITOT 1.1  --   --   PROT 6.8  --   --   ALBUMIN 3.4* 3.0* 2.9*   No results for input(s): LIPASE, AMYLASE in the last 168 hours. No results for input(s): AMMONIA in the last 168 hours. Coagulation Profile: No results for input(s): INR, PROTIME in the last 168 hours. Cardiac Enzymes: Recent Labs  Lab 02/26/19 1445  TROPONINI 0.16*   BNP (last 3 results) No results for input(s): PROBNP in the last 8760 hours. HbA1C: No results for input(s): HGBA1C in the last 72 hours. CBG: Recent Labs  Lab 02/27/19 0617 02/27/19 1139 02/27/19 1620 02/27/19 2131 02/28/19 0606  GLUCAP 142* 151* 108* 186* 73   Lipid Profile: No results for input(s): CHOL, HDL, LDLCALC, TRIG, CHOLHDL, LDLDIRECT in the last 72 hours. Thyroid Function Tests: No results for input(s): TSH, T4TOTAL, FREET4, T3FREE, THYROIDAB in the last 72 hours. Anemia Panel: Recent Labs    02/26/19 2212  VITAMINB12 784  FERRITIN 78  TIBC 300  IRON 25*   Urine analysis:    Component Value Date/Time   COLORURINE YELLOW 09/14/2018 2330   APPEARANCEUR CLOUDY (A) 09/14/2018 2330   LABSPEC 1.014 09/14/2018 2330   PHURINE 7.0 09/14/2018 2330   GLUCOSEU 50 (A) 09/14/2018 2330   HGBUR NEGATIVE 09/14/2018 2330   BILIRUBINUR NEGATIVE 09/14/2018 2330   KETONESUR 5 (A) 09/14/2018 2330   PROTEINUR 100 (A) 09/14/2018 2330   UROBILINOGEN 0.2 04/10/2015 1248   NITRITE NEGATIVE 09/14/2018 2330   LEUKOCYTESUR TRACE (A) 09/14/2018 2330   Sepsis Labs: @LABRCNTIP (procalcitonin:4,lacticidven:4) ) Recent Results (from the past 240 hour(s))  Novel Coronavirus, NAA (hospital order; send-out to ref lab)     Status: None   Collection Time: 02/26/19  5:38 PM   Specimen: Nasopharyngeal Swab; Respiratory  Result Value Ref Range Status   SARS-CoV-2, NAA NOT DETECTED NOT DETECTED Final    Comment: (NOTE) This test was developed and its performance  characteristics determined by [employer]. This test has not been FDA cleared or approved. This test has been authorized by FDA under an Emergency Use Authorization (EUA). This test is only authorized for the duration of time the declaration that circumstances exist justifying the authorization of the emergency use of in vitro diagnostic tests for detection of SARS-CoV-2 virus and/or diagnosis of COVID-19 infection under section 564(b)(1) of the Act, 21 U.S.C. 482LMB-8(M)(7), unless the authorization is terminated or revoked sooner. When diagnostic testing is negative, the possibility of a false negative result should be considered in the context of a patient's recent exposures and the presence of clinical signs  and symptoms consistent with COVID-19. An individual without symptoms of COVID-19 and who is not shedding SARS-CoV-2 virus would expect to have a negative (not detected) result in this assay. Performed  At: [facility] [address] [place], [place] [id number] Rush Farmer MD PP:[id number]    [place]uther  Final    Comment: Performed at [facility], [place] [address] [place], [place] [address]         Radiology Studies: Dg Chest [facility] 1 View  Result Date: 02/27/2019 CLINICAL DATA:  Pleural effusion, history of coronary artery disease post MI, GERD, hypertension EXAM: PORTABLE CHEST 1 VIEW COMPARISON:  Portable exam 1609 hours compared to 02/26/2019 FINDINGS: Enlargement of cardiac silhouette with slight vascular congestion. Mediastinal contours normal. Bibasilar atelectasis and small LEFT pleural effusion. Decrease in RIGHT basilar opacity since previous exam. Question minimal perihilar edema. No pneumothorax. IMPRESSION: Enlargement of cardiac silhouette with question minimal pulmonary edema. Persistent small LEFT pleural effusion with bibasilar opacities, favor atelectasis. Electronically Signed   By: Lavonia Dana M.D.   On:  02/27/2019 16:35   Dg Chest Portable 1 View  Result Date: 02/26/2019 CLINICAL DATA:  Hypoxia, acute dyspnea. EXAM: PORTABLE CHEST 1 VIEW COMPARISON:  Radiograph of September 14, 2018. FINDINGS: Stable cardiomediastinal silhouette. Moderate bilateral pleural effusions are noted with associated atelectasis or edema. No pneumothorax is noted. Bony thorax is unremarkable. IMPRESSION: Interval development of moderate pleural effusions with associated atelectasis or edema. Electronically Signed   By: Marijo Conception M.D.   On: 02/26/2019 14:56   Vas [place] Lower Extremity Venous (dvt) (mc And Wl 7a-7p)  Result Date: 02/26/2019  Lower Venous Study Indications: Edema.  Risk Factors: CHF. Limitations: Poor ultrasound/tissue interface. Comparison Study: No recent studies. Performing Technologist: Oliver Hum RVT  Examination Guidelines: A complete evaluation includes B-mode imaging, spectral Doppler, color Doppler, and power Doppler as needed of all accessible portions of each vessel. Bilateral testing is considered an integral part of a complete examination. Limited examinations for reoccurring indications may be performed as noted.  +-----+---------------+---------+-----------+----------+-------+ RIGHTCompressibilityPhasicitySpontaneityPropertiesSummary +-----+---------------+---------+-----------+----------+-------+ CFV  Full           Yes      Yes                          +-----+---------------+---------+-----------+----------+-------+   +---------+---------------+---------+-----------+----------+-------+ LEFT     CompressibilityPhasicitySpontaneityPropertiesSummary +---------+---------------+---------+-----------+----------+-------+ CFV      Full           Yes      Yes                          +---------+---------------+---------+-----------+----------+-------+ SFJ      Full                                                  +---------+---------------+---------+-----------+----------+-------+ FV Prox  Full                                                 +---------+---------------+---------+-----------+----------+-------+ FV Mid   Full                                                 +---------+---------------+---------+-----------+----------+-------+  FV DistalFull                                                 +---------+---------------+---------+-----------+----------+-------+ PFV      Full                                                 +---------+---------------+---------+-----------+----------+-------+ POP      Full           Yes      Yes                          +---------+---------------+---------+-----------+----------+-------+ PTV      Full                                                 +---------+---------------+---------+-----------+----------+-------+ PERO     Full                                                 +---------+---------------+---------+-----------+----------+-------+     Summary: Right: No evidence of common femoral vein obstruction. Left: There is no evidence of deep vein thrombosis in the lower extremity. No cystic structure found in the popliteal fossa.  *See table(s) above for measurements and observations. Electronically signed by Harold Barban MD on 02/26/2019 at 8:19:35 PM.    Final       Scheduled Meds: . aspirin  81 mg Oral Daily  . atorvastatin  80 mg Oral Daily  . carvedilol  3.125 mg Oral BID WC  . Chlorhexidine Gluconate Cloth  6 each Topical Q0600  . cholecalciferol  4,000 Units Oral Daily  . heparin  5,000 Units Subcutaneous Q8H  . hydrALAZINE  25 mg Oral Q8H  . insulin aspart  0-5 Units Subcutaneous QHS  . insulin aspart  0-9 Units Subcutaneous TID WC  . insulin glargine  20 Units Subcutaneous QHS  . isosorbide mononitrate  30 mg Oral TID  . sodium bicarbonate  1,300 mg Oral BID  . sodium chloride flush  3 mL Intravenous Q12H    Continuous Infusions: . sodium chloride    . sodium chloride Stopped (02/27/19 2224)  . ferumoxytol    . furosemide 160 mg (02/28/19 1018)     LOS: 2 days      Debbe Odea, MD [facility] Pager: [email or web address] Password [email or web address] 02/28/2019, 11:38 AM

## 2019-02-28 NOTE — Plan of Care (Signed)
  Problem: Education: Goal: Knowledge of General Education information will improve Description: Including pain rating scale, medication(s)/side effects and non-pharmacologic comfort measures Outcome: Progressing   Problem: Health Behavior/Discharge Planning: Goal: Ability to manage health-related needs will improve Outcome: Progressing   Problem: Clinical Measurements: Goal: Respiratory complications will improve Outcome: Progressing   

## 2019-02-28 NOTE — Progress Notes (Signed)
Renal Navigator received notification from Nephrologist/Dr. Augustin Coupe to initiate OP HD referral to [facility] in [facility] as patient follows with Dr. Janann August office.  Renal Navigator contacted [facility] and initiated referral. Renal Navigator will follow up with faxing medical records once they are available as patient has not had access surgery yet, HepB labs drawn, or first HD. Per staff at [facility], Jenna Brown will contact Renal Navigator to proceed with referral on 03/01/19.  Jenna Brown, [place] Renal Navigator [phone]

## 2019-02-28 NOTE — Progress Notes (Addendum)
Progress Note  Patient Name: Jenna Brown Date of Encounter: 02/28/2019  Primary Cardiologist:  Skeet Latch, MD  Subjective   Breathing so much better after thoracentesis, worried about going on HD but will do it.   Inpatient Medications    Scheduled Meds:  aspirin  81 mg Oral Daily   atorvastatin  80 mg Oral Daily   carvedilol  3.125 mg Oral BID WC   cholecalciferol  4,000 Units Oral Daily   ferrous sulfate  975 mg Oral Q breakfast   heparin  5,000 Units Subcutaneous Q8H   hydrALAZINE  25 mg Oral Q8H   insulin aspart  0-5 Units Subcutaneous QHS   insulin aspart  0-9 Units Subcutaneous TID WC   insulin glargine  20 Units Subcutaneous QHS   isosorbide mononitrate  30 mg Oral TID   sodium bicarbonate  1,300 mg Oral BID   sodium chloride flush  3 mL Intravenous Q12H   Continuous Infusions:  sodium chloride     sodium chloride Stopped (02/27/19 2224)   ferumoxytol     furosemide Stopped (02/27/19 1803)   PRN Meds: sodium chloride, sodium chloride, acetaminophen, ALPRAZolam, benzonatate, nitroGLYCERIN, ondansetron (ZOFRAN) IV, sodium chloride flush, zolpidem   Vital Signs    Vitals:   02/28/19 0029 02/28/19 0529 02/28/19 0611 02/28/19 0827  BP: (!) 96/55 (!) 104/59  (!) 98/48  Pulse: 83 88  63  Resp: 17 16    Temp: 98.6 F (37 C) 98.4 F (36.9 C)    TempSrc: Oral Oral    SpO2: 92% (!) 84% 92% 100%  Weight:  76.3 kg    Height:        Intake/Output Summary (Last 24 hours) at 02/28/2019 0847 Last data filed at 02/28/2019 6237 Gross per 24 hour  Intake 1069.46 ml  Output 750 ml  Net 319.46 ml   Filed Weights   02/26/19 2103 02/27/19 0350 02/28/19 0529  Weight: 77.2 kg 77.8 kg 76.3 kg   Last Weight  Most recent update: 02/28/2019  5:33 AM   Weight  76.3 kg (168 lb 3.2 oz)           Weight change: -0.905 kg   Telemetry    SR, occ PVCs - Personally Reviewed  ECG    None today - Personally Reviewed  Physical Exam   General:  Well developed, well nourished, female appearing in no acute distress. Head: Normocephalic, atraumatic.  Neck: Supple without bruits, JVD approx 9 cm. Lungs:  Resp regular and unlabored, rales R, bronchial BS L Heart: RRR, S1, S2, no S3, S4, or murmur; no rub. Abdomen: Soft, non-tender, non-distended with normoactive bowel sounds. No hepatomegaly. No rebound/guarding. No obvious abdominal masses. Extremities: No clubbing, cyanosis, 2-3+ LLE edema. Distal radial pulses are 2+ bilaterally. Neuro: Alert and oriented X 3. Moves all extremities spontaneously. Psych: Normal affect.  Labs    Hematology Recent Labs  Lab 02/26/19 1445 02/26/19 2212  WBC 8.3 7.3  RBC 3.09* 2.95*  HGB 8.1* 7.9*  HCT 27.3* 26.1*  MCV 88.3 88.5  MCH 26.2 26.8  MCHC 29.7* 30.3  RDW 14.1 14.2  PLT 234 223    Chemistry Recent Labs  Lab 02/26/19 1445 02/26/19 2212 02/27/19 0325 02/28/19 0556  NA 143  --  142 143  K 4.3  --  4.8 4.3  CL 111  --  110 108  CO2 20*  --  23 22  GLUCOSE 108*  --  190* 81  BUN 66*  --  67* 70*  CREATININE 5.00* 5.03* 5.03* 5.56*  CALCIUM 9.0  --  8.5* 8.7*  PROT 6.8  --   --   --   ALBUMIN 3.4*  --  3.0* 2.9*  AST 25  --   --   --   ALT 20  --   --   --   ALKPHOS 103  --   --   --   BILITOT 1.1  --   --   --   GFRNONAA 8* 8* 8* 7*  GFRAA 10* 10* 10* 9*  ANIONGAP 12  --  9 13     Cardiac Enzymes Recent Labs  Lab 02/26/19 1445  TROPONINI 0.16*      BNP Recent Labs  Lab 02/26/19 1445  BNP 1,190.5*     Radiology    Dg Chest Port 1 View  Result Date: 02/27/2019 CLINICAL DATA:  Pleural effusion, history of coronary artery disease post MI, GERD, hypertension EXAM: PORTABLE CHEST 1 VIEW COMPARISON:  Portable exam 1609 hours compared to 02/26/2019 FINDINGS: Enlargement of cardiac silhouette with slight vascular congestion. Mediastinal contours normal. Bibasilar atelectasis and small LEFT pleural effusion. Decrease in RIGHT basilar opacity since previous exam.  Question minimal perihilar edema. No pneumothorax. IMPRESSION: Enlargement of cardiac silhouette with question minimal pulmonary edema. Persistent small LEFT pleural effusion with bibasilar opacities, favor atelectasis. Electronically Signed   By: Lavonia Dana M.D.   On: 02/27/2019 16:35   Dg Chest Portable 1 View  Result Date: 02/26/2019 CLINICAL DATA:  Hypoxia, acute dyspnea. EXAM: PORTABLE CHEST 1 VIEW COMPARISON:  Radiograph of September 14, 2018. FINDINGS: Stable cardiomediastinal silhouette. Moderate bilateral pleural effusions are noted with associated atelectasis or edema. No pneumothorax is noted. Bony thorax is unremarkable. IMPRESSION: Interval development of moderate pleural effusions with associated atelectasis or edema. Electronically Signed   By: Marijo Conception M.D.   On: 02/26/2019 14:56   Vas [place] Lower Extremity Venous (dvt) (mc And Wl 7a-7p)  Result Date: 02/26/2019  Lower Venous Study Indications: Edema.  Risk Factors: CHF. Limitations: Poor ultrasound/tissue interface. Comparison Study: No recent studies. Performing Technologist: Oliver Hum RVT  Examination Guidelines: A complete evaluation includes B-mode imaging, spectral Doppler, color Doppler, and power Doppler as needed of all accessible portions of each vessel. Bilateral testing is considered an integral part of a complete examination. Limited examinations for reoccurring indications may be performed as noted.  +-----+---------------+---------+-----------+----------+-------+  RIGHT Compressibility Phasicity Spontaneity Properties Summary  +-----+---------------+---------+-----------+----------+-------+  CFV   Full            Yes       Yes                             +-----+---------------+---------+-----------+----------+-------+   +---------+---------------+---------+-----------+----------+-------+  LEFT      Compressibility Phasicity Spontaneity Properties Summary   +---------+---------------+---------+-----------+----------+-------+  CFV       Full            Yes       Yes                             +---------+---------------+---------+-----------+----------+-------+  SFJ       Full                                                      +---------+---------------+---------+-----------+----------+-------+  FV Prox   Full                                                      +---------+---------------+---------+-----------+----------+-------+  FV Mid    Full                                                      +---------+---------------+---------+-----------+----------+-------+  FV Distal Full                                                      +---------+---------------+---------+-----------+----------+-------+  PFV       Full                                                      +---------+---------------+---------+-----------+----------+-------+  POP       Full            Yes       Yes                             +---------+---------------+---------+-----------+----------+-------+  PTV       Full                                                      +---------+---------------+---------+-----------+----------+-------+  PERO      Full                                                      +---------+---------------+---------+-----------+----------+-------+     Summary: Right: No evidence of common femoral vein obstruction. Left: There is no evidence of deep vein thrombosis in the lower extremity. No cystic structure found in the popliteal fossa.  *See table(s) above for measurements and observations. Electronically signed by Harold Barban MD on 02/26/2019 at 56:19:35 PM.    Final      Cardiac Studies   None this admit  ECHO: Aug 11, 2018 - Left ventricle: The cavity size was normal. There was mild focal   basal hypertrophy of the septum. Systolic function was mildly   reduced. The estimated ejection fraction was in the range of 45%   to 50%. Hypokinesis of the  apicalinferolateral, inferior,   inferoseptal, and apical myocardium. - Aortic valve: Trileaflet; mildly thickened, mildly calcified   leaflets. Left coronary cusp mobility was moderately restricted. - Mitral valve: Calcified annulus. Mildly thickened leaflets .   There was trivial regurgitation. - Right ventricle: The cavity size was mildly dilated. Wall   thickness was normal. Systolic function was low normal. -  Atrial septum: No defect or patent foramen ovale was identified. - Tricuspid valve: There was no significant regurgitation. - Pulmonic valve: There was trivial regurgitation.  Impressions:  - EF using wall tracking approximately 45%. Hypokinesis of distal   inferior, inferolateral, inferoseptal, and apical walls. Wall   motion visually appears similar to prior. Thickened LCC of aortic   valve appears similar to prior.  Patient Profile     68 y.o. female w/ hx chronic systolic and diastolic heart failure, severe CAD, not a candidate for CABG, s/p R AKA, hypertension, DM, NSTEMI x 4, last in 04/2018, who was admitted 06/09 with CP, CHF, acute on chronic renal failure, cards saw 06/10.  Assessment & Plan    1. Acute on chronic combined CHF:  - Nephrology seeing, plan is for HD cath and HD tomorrow - pt on high-dose Lasix for now  2. Pleural effusions:  - s/p thoracentesis 06/10 with 1.2 L out on R - discuss L thoracentesis w/ MD - could just wait and see if effusions improve w/ HD  3. CAD:  - no ischemic sx - on high-dose statin, low-dose BB & Imdur - BP too low to increase anything - no ischemic sx now.  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   Assessment and Plan: Acute on chronic combined CHF CAD Elevated troponin Hypertension Hyperlipidemia Anemia Acute on chronic kidney failure, stage V  S/P thoracentesis of 1.2 L yesterday on the right, plan for the left thoracentesis today, and HD starting tomorrow. She is oliguric, not responding to high doses of Lasix, crea is uptrending. She feels significantly better post thoracentesis, her LE edema is still severe pitting. Medical therapy for CAD (ASA, statin, imdur), elevated troponin sec to CHF, acute on chronic kidney failure.  Ena Dawley, MD 02/28/2019

## 2019-02-28 NOTE — Consult Note (Addendum)
Hospital Consult    Reason for Consult: Hemodialysis access Requesting Physician: Dr. Augustin Coupe MRN #:  [id number]  History of Present Illness: This is a 68 y.o. 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  She is being seen in consultation for evaluation of tunneled dialysis catheter placement as well as permanent dialysis access placement.  She is left arm dominant and would prefer access placement and right arm.  She currently has a IV in her right AC fossa.  She is status post thoracentesis due to fluid overload in the presence of worsening CKD and possible CHF exacerbation.  She is still requiring O2 by nasal cannula however is less short of breath.  She does not take any blood thinners.  She does not have a pacemaker.  She is being followed by Dr. Joesph July nephrologist in [facility].  Patient has already had breakfast this morning.  Past Medical History:  Diagnosis Date  . Anemia   . Anxiety    takes Xanax prn  . Arthritis   . CKD (chronic kidney disease), stage IV ([place])   . Coronary artery disease 2016   cath w/ 90% LAD, 95%CFX, 80% OM 2, 60% RCA, not CABG candidate, rx medically  . GERD (gastroesophageal reflux disease)   . H/O hiatal hernia   . Headache(784.0)    when b/p is elevated  . Hemorrhoids   . Hx of AKA (above knee amputation), right ([place])   . Hyperlipidemia    takes Simvastatin daily  . Hypertension    takes Amlodipine/HCTZ/Losartan daily  . Migraines   . Myocardial infarction ([place]) 1990's  . NSTEMI (non-ST elevated myocardial infarction) ([place])   . Peripheral vascular disease ([place])   . PONV (postoperative nausea and vomiting)   . Stroke [facility])    TIA history  . Type II diabetes mellitus (HCC)    on Lantus  . Vertigo    takes Ativert prn    Past Surgical History:  Procedure Laterality Date  . ABDOMINAL AORTAGRAM N/A 02/29/2012   Procedure: ABDOMINAL Maxcine Ham;   Surgeon: [name], MD;  Location: [facility] CATH LAB;  Service: Cardiovascular;  Laterality: N/A;  . AMPUTATION  05/10/2012   Procedure: AMPUTATION ABOVE KNEE;  Surgeon: [name], MD;  Location: [facility] OR;  Service: Vascular;  Laterality: Right;   open right groin wound noted  . aortogram  02/2012  . CARDIAC CATHETERIZATION N/A 04/13/2015   Procedure: Right/Left Heart Cath and Coronary Angiography;  Surgeon: [name], MD;  Location: [place] CV LAB;  Service: Cardiovascular;  Laterality: N/A;  . CLEFT PALATE REPAIR     several  . COLONOSCOPY    . DILATION AND CURETTAGE OF UTERUS    . FEMORAL-POPLITEAL BYPASS GRAFT  04/05/2012   Procedure: BYPASS GRAFT FEMORAL-POPLITEAL ARTERY;  Surgeon: [name], MD;  Location: [facility];  Service: Vascular;  Laterality: Right;  REVISION  . FEMORAL-TIBIAL BYPASS GRAFT  03/29/2012   Procedure: BYPASS GRAFT FEMORAL-TIBIAL ARTERY;  Surgeon: [name], MD;  Location: [place] [facility] OR;  Service: Vascular;  Laterality: Right;  Right femoral to Posterior Tibialis with composite graft of 52mm x 80 cm ringed gortex graft and saphenous vein  ,intraoperative arteriogram  . FEMOROPOPLITEAL THROMBECTOMY / EMBOLECTOMY  05/09/2012  . MYOMECTOMY    . PR VEIN BYPASS GRAFT,AORTO-FEM-POP  05/27/11   Right SFA-Below knee Pop BP  . TEE WITHOUT CARDIOVERSION N/A 01/03/2017  Procedure: TRANSESOPHAGEAL ECHOCARDIOGRAM (TEE);  Surgeon: Skeet Latch, MD;  Location: [place];  Service: Cardiovascular;  Laterality: N/A;  . THORACENTESIS  02/27/2019      . TONSILLECTOMY    . TUBAL LIGATION  ~ 1990    Allergies  Allergen Reactions  . Plavix [Clopidogrel Bisulfate] Itching  . Codeine Other (See Comments)    "makes me feel strange"  . Tylenol With Codeine #3 [Acetaminophen-Codeine] Other (See Comments)    "doesn't make me feel right"    Prior to Admission medications   Medication Sig Start Date End Date Taking? Authorizing Provider  acetaminophen (TYLENOL) 500 MG  tablet Take 500 mg by mouth every 6 (six) hours as needed for mild pain.   Yes [provider]  ALPRAZolam (XANAX) 0.25 MG tablet Take 1 tablet (0.25 mg total) by mouth 3 (three) times daily as needed for anxiety. Patient taking differently: Take 0.25 mg by mouth daily as needed for anxiety.  01/03/17  Yes Carlota Raspberry, Tiffany, PA-C  amLODipine (NORVASC) 10 MG tablet Take 1 tablet (10 mg total) by mouth daily. 11/16/18  Yes Skeet Latch, MD  aspirin 81 MG chewable tablet Chew 81 mg by mouth daily.   Yes [provider]  atorvastatin (LIPITOR) 80 MG tablet Take 80 mg by mouth daily.  01/09/18  Yes [provider]  Cholecalciferol (VITAMIN D-3 PO) Take 4,000 Units by mouth daily.    Yes [provider]  doxazosin (CARDURA) 1 MG tablet Take 1 tablet (1 mg total) by mouth at bedtime. 09/16/18  Yes Mikhail, [place], DO  ferrous sulfate 325 (65 FE) MG tablet Take 957 mg by mouth daily with breakfast.    Yes [provider]  furosemide (LASIX) 40 MG tablet Take 1 tablet (40 mg total) by mouth daily as needed. Patient taking differently: Take 40 mg by mouth every other day.  01/29/18  Yes [name], PA-C  hydrALAZINE (APRESOLINE) 100 MG tablet Take 1 tablet (100 mg total) by mouth 3 (three) times daily. Patient taking differently: Take 50 mg by mouth 3 (three) times daily.  11/16/18  Yes Skeet Latch, MD  insulin glargine (LANTUS) 100 UNIT/ML injection Inject 0.25 mLs (25 Units total) into the skin 2 (two) times daily. Patient taking differently: Inject 20 Units into the skin at bedtime.  01/03/17  Yes Carlota Raspberry, Tiffany, PA-C  isosorbide mononitrate (IMDUR) 120 MG 24 hr tablet Take 1 tablet (120 mg total) by mouth daily. Patient taking differently: Take 30 mg by mouth 3 (three) times daily.  01/21/19  Yes Bensimhon, Shaune Pascal, MD  losartan (COZAAR) 100 MG tablet Take 100 mg by mouth daily.   Yes [provider]  nitroGLYCERIN (NITROSTAT) 0.4 MG  SL tablet Place 1 tablet (0.4 mg total) under the tongue every 5 (five) minutes x 3 doses as needed for chest pain. 08/13/15  Yes Theora Gianotti, NP  sodium bicarbonate 650 MG tablet Take 1,300 mg by mouth 2 (two) times daily.   Yes [provider]  sodium polystyrene (KAYEXALATE) 15 GM/60ML suspension Take 7.5 mg by mouth once.   Yes [provider]    Social History   Socioeconomic History  . Marital status: Single    Spouse name: Not on file  . Number of children: Not on file  . Years of education: Not on file  . Highest education level: Not on file  Occupational History  . Occupation: disabled  Social Needs  . Financial resource strain: Not on file  .  Food insecurity    Worry: Not on file    Inability: Not on file  . Transportation needs    Medical: Not on file    Non-medical: Not on file  Tobacco Use  . Smoking status: Former Smoker    Packs/day: 0.25    Years: 40.00    Pack years: 10.00    Types: Cigarettes    Quit date: 02/27/2012    Years since quitting: 7.0  . Smokeless tobacco: Never Used  Substance and Sexual Activity  . Alcohol use: No  . Drug use: No  . Sexual activity: Never    Birth control/protection: Post-menopausal  Lifestyle  . Physical activity    Days per week: Not on file    Minutes per session: Not on file  . Stress: Not on file  Relationships  . Social Herbalist on phone: Not on file    Gets together: Not on file    Attends religious service: Not on file    Active member of club or organization: Not on file    Attends meetings of clubs or organizations: Not on file    Relationship status: Not on file  . Intimate partner violence    Fear of current or ex partner: Not on file    Emotionally abused: Not on file    Physically abused: Not on file    Forced sexual activity: Not on file  Other Topics Concern  . Not on file  Social History Narrative   She lives in [place] with family.     Family  History  Problem Relation Age of Onset  . Cancer Mother        breast  . Diabetes Father   . Cancer Brother   . Heart attack Maternal Grandmother   . Heart attack Maternal Grandfather     ROS: Otherwise negative unless mentioned in HPI  Physical Examination  Vitals:   02/28/19 0611 02/28/19 0827  BP:  (!) 98/48  Pulse:  63  Resp:    Temp:    SpO2: 92% 100%   Body mass index is 28.87 kg/m.  General:  WDWN in NAD Gait: Not observed HENT: WNL, normocephalic Pulmonary: normal non-labored breathing on nasal cannula Cardiac: regular Skin: without rashes Vascular Exam/Pulses: Right AKA; left foot warm to touch no palpable pulses, no tissue loss Extremities: without ischemic changes, without Gangrene , without cellulitis; left lower extremity with pitting edema to the level of the knee Musculoskeletal: no muscle wasting or atrophy  Neurologic: A&O X 3;  No focal weakness or paresthesias are detected; speech is fluent/normal Psychiatric:  The pt has Normal affect. Lymph:  Unremarkable  CBC    Component Value Date/Time   WBC 7.3 02/26/2019 2212   RBC 2.95 (L) 02/26/2019 2212   HGB 7.9 (L) 02/26/2019 2212   HCT 26.1 (L) 02/26/2019 2212   HCT 25.0 (L) 03/27/2017 1823   PLT 223 02/26/2019 2212   MCV 88.5 02/26/2019 2212   MCH 26.8 02/26/2019 2212   MCHC 30.3 02/26/2019 2212   RDW 14.2 02/26/2019 2212   LYMPHSABS 1.7 02/26/2019 1445   MONOABS 0.7 02/26/2019 1445   EOSABS 0.1 02/26/2019 1445   BASOSABS 0.0 02/26/2019 1445    BMET    Component Value Date/Time   NA 143 02/28/2019 0556   K 4.3 02/28/2019 0556   CL 108 02/28/2019 0556   CO2 22 02/28/2019 0556   GLUCOSE 81 02/28/2019 0556   BUN 70 (H) 02/28/2019 0556  CREATININE 5.56 (H) 02/28/2019 0556   CALCIUM 8.7 (L) 02/28/2019 0556   GFRNONAA 7 (L) 02/28/2019 0556   GFRAA 9 (L) 02/28/2019 0556    COAGS: Lab Results  Component Value Date   INR 0.98 09/14/2018   INR 1.16 04/20/2018   INR 0.90 12/28/2016      Non-Invasive Vascular Imaging:   Vein map pending   ASSESSMENT/PLAN: This is a 68 y.o. female with worsening CKD progressing towards end-stage renal disease with plans to initiate hemodialysis in the near future  -Left arm dominant, patient would prefer access placement in right arm -Vein map pending; continue IV in right arm until vein mapping is reviewed -Plan will be for [facility] placement as well as AV fistula versus graft based on vein mapping -On-call vascular surgeon Dr. Donzetta Matters will evaluate the patient and provide further treatment plan   Dagoberto Ligas PA-C [facility] [phone]  I have independently interviewed and examined patient and agree with PA assessment and plan above.  I have independently interpreted her vein mapping she appears to have a cephalic vein on the right.  Currently has an IV in this vein we will switch this to the left.  Plan will be for tunneled dialysis catheter and right arm access on Monday.  Haroldine Redler C. Donzetta Matters, MD [facility] [place] Office: [phone] Pager: [phone]

## 2019-02-28 NOTE — Progress Notes (Signed)
Bilateral upper extremity vein mapping has been completed. Preliminary results can be found in CV Proc through chart review.   02/28/19 2:48 PM Jenna Brown RVT

## 2019-02-28 NOTE — Progress Notes (Addendum)
[facility] Progress Note    Assessment/ Plan:   67y/o woman w/ PMH significant for multivessel CAD (inoperable), CHF (EF 45-50%), IDDM, HTN, HLD, PVD s/p RAKA, CVA, and CKD stage 4 (follows Dr. Joesph July in [facility]) who presented to [facility] ED with a 3 day history of worsening SOB and lower extremity edema of her left leg s/p rt thoracentesis 6/10 (124ml).  1.  AKI/CKD stage 4 vs progressive CKD (no Cr levels for the past 6 months available for review) in setting of acute on chronic combined diastolic and systolic CHF.  Agree with IV lasix and follow renal function.   1. I have contact Dr. Janann August office this morning for baseline Cr levels and she was already in the 4's last week and close to having to start dialysis. Pt had been putting off having to make a decision. 2. Hold ARB; also very poor appetite likely as a result of uremia. 3. Will ask [facility] to see her for tunneled catheter and access placement. Vein mapping requested 2. Acute hypoxic respiratory distress- due to CHF.  Continue with O2 via [place] and diuresis. 3. Acute on chronic combined diastolic and systolic CHF- as above. CXR with pleural effusions.  Follow after IV lasix; not a great response. 4. Anemia of CKD- iron deficient -> will load with Feraheme today  and start ESA ESA tomorrow. 5. CAD- currently stable, no CP 6. DM- per primary svc 7. HLD- cont with lipitor   Subjective:   Still dyspneic but improved after rt thoracentesis. No fever, chills, nausea. Very poor appetite.   Objective:   BP (!) 104/59 (BP Location: Right Arm)   Pulse 88   Temp 98.4 F (36.9 C) (Oral)   Resp 16   Ht 5\' 4"  (1.626 m)   Wt 76.3 kg   SpO2 92%   BMI 28.87 kg/m   Intake/Output Summary (Last 24 hours) at 02/28/2019 0817 Last data filed at 02/28/2019 6629 Gross per 24 hour  Intake 1309.46 ml  Output 750 ml  Net 559.46 ml   Weight change: -0.905 kg  Physical Exam: GEN: NAD, A&Ox3, NCAT HEENT:  EOMI, +pallor NECK:  Supple, +JVD LUNGS: very poor air movement CV: RRR ABD: SNDNT +BS  EXT: right AKA, 2+ lower extremity edema    Imaging: Dg Chest Port 1 View  Result Date: 02/27/2019 CLINICAL DATA:  Pleural effusion, history of coronary artery disease post MI, GERD, hypertension EXAM: PORTABLE CHEST 1 VIEW COMPARISON:  Portable exam 1609 hours compared to 02/26/2019 FINDINGS: Enlargement of cardiac silhouette with slight vascular congestion. Mediastinal contours normal. Bibasilar atelectasis and small LEFT pleural effusion. Decrease in RIGHT basilar opacity since previous exam. Question minimal perihilar edema. No pneumothorax. IMPRESSION: Enlargement of cardiac silhouette with question minimal pulmonary edema. Persistent small LEFT pleural effusion with bibasilar opacities, favor atelectasis. Electronically Signed   By: Lavonia Dana M.D.   On: 02/27/2019 16:35   Dg Chest Portable 1 View  Result Date: 02/26/2019 CLINICAL DATA:  Hypoxia, acute dyspnea. EXAM: PORTABLE CHEST 1 VIEW COMPARISON:  Radiograph of September 14, 2018. FINDINGS: Stable cardiomediastinal silhouette. Moderate bilateral pleural effusions are noted with associated atelectasis or edema. No pneumothorax is noted. Bony thorax is unremarkable. IMPRESSION: Interval development of moderate pleural effusions with associated atelectasis or edema. Electronically Signed   By: Marijo Conception M.D.   On: 02/26/2019 14:56   Vas [place] Lower Extremity Venous (dvt) ([facility] 7a-7p)  Result Date: 02/26/2019  Lower Venous Study Indications: Edema.  Risk Factors: CHF. Limitations: Poor ultrasound/tissue interface. Comparison Study: No recent studies. Performing Technologist: Oliver Hum RVT  Examination Guidelines: A complete evaluation includes B-mode imaging, spectral Doppler, color Doppler, and power Doppler as needed of all accessible portions of each vessel. Bilateral testing is considered an integral part of a complete examination. Limited examinations for  reoccurring indications may be performed as noted.  +-----+---------------+---------+-----------+----------+-------+ RIGHTCompressibilityPhasicitySpontaneityPropertiesSummary +-----+---------------+---------+-----------+----------+-------+ CFV  Full           Yes      Yes                          +-----+---------------+---------+-----------+----------+-------+   +---------+---------------+---------+-----------+----------+-------+ LEFT     CompressibilityPhasicitySpontaneityPropertiesSummary +---------+---------------+---------+-----------+----------+-------+ CFV      Full           Yes      Yes                          +---------+---------------+---------+-----------+----------+-------+ SFJ      Full                                                 +---------+---------------+---------+-----------+----------+-------+ FV Prox  Full                                                 +---------+---------------+---------+-----------+----------+-------+ FV Mid   Full                                                 +---------+---------------+---------+-----------+----------+-------+ FV DistalFull                                                 +---------+---------------+---------+-----------+----------+-------+ PFV      Full                                                 +---------+---------------+---------+-----------+----------+-------+ POP      Full           Yes      Yes                          +---------+---------------+---------+-----------+----------+-------+ PTV      Full                                                 +---------+---------------+---------+-----------+----------+-------+ PERO     Full                                                 +---------+---------------+---------+-----------+----------+-------+  Summary: Right: No evidence of common femoral vein obstruction. Left: There is no evidence of deep vein thrombosis in  the lower extremity. No cystic structure found in the popliteal fossa.  *See table(s) above for measurements and observations. Electronically signed by Harold Barban MD on 02/26/2019 at 6:19:35 PM.    Final     Labs: BMET Recent Labs  Lab 02/26/19 1445 02/26/19 2212 02/27/19 0325 02/28/19 0556  NA 143  --  142 143  K 4.3  --  4.8 4.3  CL 111  --  110 108  CO2 20*  --  23 22  GLUCOSE 108*  --  190* 81  BUN 66*  --  67* 70*  CREATININE 5.00* 5.03* 5.03* 5.56*  CALCIUM 9.0  --  8.5* 8.7*  PHOS  --   --  4.9* 4.5   CBC Recent Labs  Lab 02/26/19 1445 02/26/19 2212  WBC 8.3 7.3  NEUTROABS 5.8  --   HGB 8.1* 7.9*  HCT 27.3* 26.1*  MCV 88.3 88.5  PLT 234 223    Medications:    . aspirin  81 mg Oral Daily  . atorvastatin  80 mg Oral Daily  . carvedilol  3.125 mg Oral BID WC  . cholecalciferol  4,000 Units Oral Daily  . ferrous sulfate  975 mg Oral Q breakfast  . heparin  5,000 Units Subcutaneous Q8H  . hydrALAZINE  25 mg Oral Q8H  . insulin aspart  0-5 Units Subcutaneous QHS  . insulin aspart  0-9 Units Subcutaneous TID WC  . insulin glargine  20 Units Subcutaneous QHS  . isosorbide mononitrate  30 mg Oral TID  . sodium bicarbonate  1,300 mg Oral BID  . sodium chloride flush  3 mL Intravenous Q12H      Otelia Santee, MD 02/28/2019, 8:17 AM

## 2019-02-28 NOTE — Progress Notes (Signed)
IR requested by Rosaria Ferries, PA-C for possible image-guided left thoracentesis.  Limited chest ultrasound revealed little to no fluid (with lung flap within target window) that could be safely accessed with procedure today. Images reviewed with Micah (ultrasound tech) who agrees. Images sent to Dr. [name] for further review. Informed patient that procedure will not occur today. All questions answered and concerns addressed. Dr. Wynelle Cleveland made aware.  IR available in future if needed.   Bea Graff Louk, PA-C 02/28/2019, 4:07 PM

## 2019-03-01 ENCOUNTER — Inpatient Hospital Stay (HOSPITAL_COMMUNITY): Payer: Medicare Other

## 2019-03-01 ENCOUNTER — Encounter (HOSPITAL_COMMUNITY): Payer: Self-pay | Admitting: Interventional Radiology

## 2019-03-01 DIAGNOSIS — N179 Acute kidney failure, unspecified: Secondary | ICD-10-CM

## 2019-03-01 HISTORY — PX: IR US GUIDE VASC ACCESS RIGHT: IMG2390

## 2019-03-01 HISTORY — PX: IR FLUORO GUIDE CV LINE RIGHT: IMG2283

## 2019-03-01 LAB — GLUCOSE, CAPILLARY
Glucose-Capillary: 163 mg/dL — ABNORMAL HIGH (ref 70–99)
Glucose-Capillary: 110 mg/dL — ABNORMAL HIGH (ref 70–99)
Glucose-Capillary: 70 mg/dL (ref 70–99)
Glucose-Capillary: 198 mg/dL — ABNORMAL HIGH (ref 70–99)

## 2019-03-01 LAB — CBC
HCT: 25.2 % — ABNORMAL LOW (ref 36.0–46.0)
Hemoglobin: 7.6 g/dL — ABNORMAL LOW (ref 12.0–15.0)
MCH: 26.5 pg (ref 26.0–34.0)
MCHC: 30.2 g/dL (ref 30.0–36.0)
MCV: 87.8 fL (ref 80.0–100.0)
Platelets: 154 10*3/uL (ref 150–400)
RBC: 2.87 MIL/uL — ABNORMAL LOW (ref 3.87–5.11)
RDW: 14.3 % (ref 11.5–15.5)
WBC: 7.3 10*3/uL (ref 4.0–10.5)
nRBC: 0.3 % — ABNORMAL HIGH (ref 0.0–0.2)

## 2019-03-01 LAB — RENAL FUNCTION PANEL
Albumin: 2.8 g/dL — ABNORMAL LOW (ref 3.5–5.0)
Anion gap: 9 (ref 5–15)
BUN: 74 mg/dL — ABNORMAL HIGH (ref 8–23)
CO2: 24 mmol/L (ref 22–32)
Calcium: 8.3 mg/dL — ABNORMAL LOW (ref 8.9–10.3)
Chloride: 107 mmol/L (ref 98–111)
Creatinine, Ser: 5.81 mg/dL — ABNORMAL HIGH (ref 0.44–1.00)
GFR calc Af Amer: 8 mL/min — ABNORMAL LOW (ref >60)
GFR calc non Af Amer: 7 mL/min — ABNORMAL LOW (ref >60)
Glucose, Bld: 146 mg/dL — ABNORMAL HIGH (ref 70–99)
Phosphorus: 4.6 mg/dL (ref 2.5–4.6)
Potassium: 4.3 mmol/L (ref 3.5–5.1)
Sodium: 140 mmol/L (ref 135–145)

## 2019-03-01 LAB — HEPATITIS C ANTIBODY: HCV Ab: <0.1 s/co ratio (ref 0.0–0.9)

## 2019-03-01 LAB — HEPATITIS B SURFACE ANTIGEN: Hepatitis B Surface Ag: NEGATIVE

## 2019-03-01 LAB — PROTIME-INR
INR: 1.2 (ref 0.8–1.2)
Prothrombin Time: 15.2 seconds (ref 11.4–15.2)

## 2019-03-01 LAB — HEPATITIS B CORE ANTIBODY, TOTAL: Hep B Core Total Ab: NEGATIVE

## 2019-03-01 LAB — HEPATITIS B SURFACE ANTIBODY, QUANTITATIVE: Hep B S AB Quant (Post): <3.1 m[IU]/mL — ABNORMAL LOW (ref >9.9)

## 2019-03-01 MED ORDER — DARBEPOETIN ALFA 100 MCG/0.5ML IJ SOSY
100.0000 ug | PREFILLED_SYRINGE | Freq: Once | INTRAMUSCULAR | Status: AC
  Administered 2019-03-01: 100 ug via INTRAVENOUS

## 2019-03-01 MED ORDER — LIDOCAINE HCL (PF) 1 % IJ SOLN: 5.0000 mL | INTRAMUSCULAR | Status: DC | PRN

## 2019-03-01 MED ORDER — HEPARIN SODIUM (PORCINE) 1000 UNIT/ML DIALYSIS
1000.0000 [IU] | INTRAMUSCULAR | Status: DC | PRN
  Administered 2019-03-02: 16:00:00 3200 [IU] via INTRAVENOUS_CENTRAL
  Filled 2019-03-01 (×2): qty 1

## 2019-03-01 MED ORDER — FENTANYL CITRATE (PF) 100 MCG/2ML IJ SOLN
INTRAMUSCULAR | Status: AC
  Filled 2019-03-01: qty 2

## 2019-03-01 MED ORDER — CEFAZOLIN SODIUM-DEXTROSE 2-4 GM/100ML-% IV SOLN
2.0000 g | Freq: Once | INTRAVENOUS | Status: AC
  Administered 2019-03-01: 2 g via INTRAVENOUS

## 2019-03-01 MED ORDER — MIDAZOLAM HCL 2 MG/2ML IJ SOLN
INTRAMUSCULAR | Status: AC | PRN
  Administered 2019-03-01: 1 mg via INTRAVENOUS

## 2019-03-01 MED ORDER — HEPARIN SODIUM (PORCINE) 1000 UNIT/ML IJ SOLN
INTRAMUSCULAR | Status: AC
  Administered 2019-03-01: 3.2 mL
  Filled 2019-03-01: qty 1

## 2019-03-01 MED ORDER — SODIUM CHLORIDE 0.9 % IV SOLN: 100.0000 mL | INTRAVENOUS | Status: DC | PRN

## 2019-03-01 MED ORDER — CEFAZOLIN SODIUM-DEXTROSE 2-4 GM/100ML-% IV SOLN
INTRAVENOUS | Status: AC
  Filled 2019-03-01: qty 100

## 2019-03-01 MED ORDER — LIDOCAINE-PRILOCAINE 2.5-2.5 % EX CREA
1.0000 "application " | TOPICAL_CREAM | CUTANEOUS | Status: DC | PRN
  Filled 2019-03-01: qty 5

## 2019-03-01 MED ORDER — PENTAFLUOROPROP-TETRAFLUOROETH EX AERO: 1.0000 "application " | INHALATION_SPRAY | CUTANEOUS | Status: DC | PRN

## 2019-03-01 MED ORDER — LIDOCAINE HCL 1 % IJ SOLN
INTRAMUSCULAR | Status: AC
  Filled 2019-03-01: qty 20

## 2019-03-01 MED ORDER — ALTEPLASE 2 MG IJ SOLR: 2.0000 mg | Freq: Once | INTRAMUSCULAR | Status: DC | PRN

## 2019-03-01 MED ORDER — LIDOCAINE HCL (PF) 1 % IJ SOLN
INTRAMUSCULAR | Status: AC | PRN
  Administered 2019-03-01: 5 mL

## 2019-03-01 MED ORDER — HEPARIN SODIUM (PORCINE) 1000 UNIT/ML IJ SOLN
INTRAMUSCULAR | Status: AC
  Administered 2019-03-01: 3200 [IU]
  Filled 2019-03-01: qty 4

## 2019-03-01 MED ORDER — DARBEPOETIN ALFA 100 MCG/0.5ML IJ SOSY
PREFILLED_SYRINGE | INTRAMUSCULAR | Status: AC
  Filled 2019-03-01: qty 0.5

## 2019-03-01 MED ORDER — MIDAZOLAM HCL 2 MG/2ML IJ SOLN
INTRAMUSCULAR | Status: AC
  Filled 2019-03-01: qty 2

## 2019-03-01 MED ORDER — GELATIN ABSORBABLE 12-7 MM EX MISC
CUTANEOUS | Status: AC
  Filled 2019-03-01: qty 1

## 2019-03-01 MED ORDER — FENTANYL CITRATE (PF) 100 MCG/2ML IJ SOLN
INTRAMUSCULAR | Status: AC | PRN
  Administered 2019-03-01: 25 ug via INTRAVENOUS

## 2019-03-01 NOTE — Procedures (Signed)
esrd  S/P RT IJ TUNNELED HD CATH  Tip svcra No comp Stable ebl min Ready for use Full report in pacs

## 2019-03-01 NOTE — Progress Notes (Addendum)
[facility] Progress Note    Assessment/ Plan:   67y/o woman w/ PMH significant for multivessel CAD (inoperable), CHF (EF 45-50%), IDDM, HTN, HLD, PVD s/p RAKA, CVA, and CKD stage 4 (follows Dr. Joesph July in [facility]) who presented to [facility] ED with a 3 day history of worsening SOB and lower extremity edema of her left leg s/p rt thoracentesis 6/10 (1245ml).  1. AKI/CKD stage 4 vs progressive CKD (no Cr levels for the past 6 months available for review) in setting of acute on chronic combined diastolic and systolic CHF. Agree with IV lasix and follow renal function.  1. I have contact Dr. Janann August office 6/11 for baseline Cr levels and she was already in the 4's 1st week of June and close to having to start dialysis. Pt had been putting off having to make a decision. 2. Hold ARB; also very poor appetite likely as a result of uremia. 3. Appreciate Dr. Donzetta Matters seeing her for access placement. Vein mapping performed. Currently w/ IV in right AC fossa. Restrict Rt arm 4. Stopped the NaHCO3 on 6/12. 5. I spoke with Dr. [name] and plan will be for VIR TC placement + thoracentesis if necessary nad access Mon by Dr Donzetta Matters. 6. Will HD as soon as TC is in. 2. Acute hypoxic respiratory distress- due to CHF. Continue with O2 via  and diuresis. Will be getting a left throacentesis by VIR 3. Acute on chronic combined diastolic and systolic CHF- as above. CXR with pleural effusions. Follow after IV lasix; not a great response. Currently d/c'd 4. Anemia of CKD- iron deficient -> will load with Feraheme 6/11 (received)  and start ESA with dialysis (Aranesp 100 IV) . 5. CAD- currently stable, no CP 6. DM- per primary svc 7. HLD- cont with lipitor  Subjective:   Still dyspneic but improved after rt thoracentesis. No fever, chills, nausea. Very poor appetite.  No events overnight   Objective:   BP 118/60 (BP Location: Left Arm)   Pulse 91   Temp 97.8 F (36.6 C) (Oral)   Resp 20   Ht 5'  4" (1.626 m)   Wt 74.3 kg   SpO2 99%   BMI 28.12 kg/m   Intake/Output Summary (Last 24 hours) at 03/01/2019 0704 Last data filed at 03/01/2019 0344 Gross per 24 hour  Intake 760 ml  Output 1010 ml  Net -250 ml   Weight change: -1.996 kg  Physical Exam: GEN: NAD, A&Ox3, NCAT HEENT:  EOMI, +pallor NECK: Supple, +JVD LUNGS: very poor air movement CV: RRR ABD: SNDNT +BS  EXT: right AKA, 2+ left lower extremity edema  Imaging: Dg Chest Port 1 View  Result Date: 02/27/2019 CLINICAL DATA:  Pleural effusion, history of coronary artery disease post MI, GERD, hypertension EXAM: PORTABLE CHEST 1 VIEW COMPARISON:  Portable exam 1609 hours compared to 02/26/2019 FINDINGS: Enlargement of cardiac silhouette with slight vascular congestion. Mediastinal contours normal. Bibasilar atelectasis and small LEFT pleural effusion. Decrease in RIGHT basilar opacity since previous exam. Question minimal perihilar edema. No pneumothorax. IMPRESSION: Enlargement of cardiac silhouette with question minimal pulmonary edema. Persistent small LEFT pleural effusion with bibasilar opacities, favor atelectasis. Electronically Signed   By: Lavonia Dana M.D.   On: 02/27/2019 16:35   Ir US Chest  Result Date: 02/28/2019 CLINICAL DATA:  Assess for pleural effusions EXAM: ULTRASOUND OF CHEST SOFT TISSUES TECHNIQUE: Ultrasound examination of the chest wall soft tissues was performed in the area of clinical concern. COMPARISON:  02/27/2019 FINDINGS: Ultrasound of  the posterior chest performed with the patient upright. No significant right pleural effusion. Small non loculated left pleural effusion. IMPRESSION: Small non loculated left pleural effusion. Recommend repeat chest x-ray tomorrow and if further progression reconsider thoracentesis. Electronically Signed   By: Jerilynn Mages.  Shick M.D.   On: 02/28/2019 16:53   Vas [place] Upper Ext Vein Mapping (pre-op Avf)  Result Date: 02/28/2019 UPPER EXTREMITY VEIN MAPPING  Indications:  Pre-access. Performing Technologist: Oliver Hum RVT  Examination Guidelines: A complete evaluation includes B-mode imaging, spectral Doppler, color Doppler, and power Doppler as needed of all accessible portions of each vessel. Bilateral testing is considered an integral part of a complete examination. Limited examinations for reoccurring indications may be performed as noted. +-----------------+-------------+----------+----------------------------------+ Right Cephalic   Diameter (cm)Depth (cm)             Findings              +-----------------+-------------+----------+----------------------------------+ Shoulder             0.41        1.15                                      +-----------------+-------------+----------+----------------------------------+ Prox upper arm       0.34        1.15                                      +-----------------+-------------+----------+----------------------------------+ Mid upper arm        0.38        1.03               branching              +-----------------+-------------+----------+----------------------------------+ Dist upper arm       0.37        0.74                                      +-----------------+-------------+----------+----------------------------------+ Antecubital fossa    0.44        0.61                                      +-----------------+-------------+----------+----------------------------------+ Prox forearm                            not visualized and IV and bandages +-----------------+-------------+----------+----------------------------------+ Mid forearm          0.18        0.43                                      +-----------------+-------------+----------+----------------------------------+ Dist forearm         0.26        0.20               branching              +-----------------+-------------+----------+----------------------------------+  +-----------------+-------------+----------+---------+ Right Basilic    Diameter (cm)Depth (cm)Findings  +-----------------+-------------+----------+---------+ Shoulder             0.46  0.88             +-----------------+-------------+----------+---------+ Mid upper arm        0.46        1.02             +-----------------+-------------+----------+---------+ Dist upper arm       0.23        1.08   branching +-----------------+-------------+----------+---------+ Antecubital fossa    0.18        0.31             +-----------------+-------------+----------+---------+ Prox forearm         0.22        0.53             +-----------------+-------------+----------+---------+ Mid forearm          0.19        0.24             +-----------------+-------------+----------+---------+ Distal forearm       0.19        0.23             +-----------------+-------------+----------+---------+ +-----------------+-------------+----------+---------+ Left Cephalic    Diameter (cm)Depth (cm)Findings  +-----------------+-------------+----------+---------+ Shoulder             0.46        1.07             +-----------------+-------------+----------+---------+ Prox upper arm       0.22        0.95             +-----------------+-------------+----------+---------+ Mid upper arm        0.14        0.85   branching +-----------------+-------------+----------+---------+ Dist upper arm       0.13        0.76             +-----------------+-------------+----------+---------+ Antecubital fossa    0.15        0.54             +-----------------+-------------+----------+---------+ Prox forearm         0.12        0.34   branching +-----------------+-------------+----------+---------+ Mid forearm          0.13        0.25             +-----------------+-------------+----------+---------+ Dist forearm         0.08        0.25              +-----------------+-------------+----------+---------+ +-----------------+-------------+----------+---------+ Left Basilic     Diameter (cm)Depth (cm)Findings  +-----------------+-------------+----------+---------+ Shoulder             0.24        1.00             +-----------------+-------------+----------+---------+ Mid upper arm        0.24        0.90             +-----------------+-------------+----------+---------+ Dist upper arm       0.20        0.70             +-----------------+-------------+----------+---------+ Antecubital fossa    0.12        0.64   branching +-----------------+-------------+----------+---------+ Prox forearm         0.15        0.55             +-----------------+-------------+----------+---------+ Mid forearm  0.16        0.16             +-----------------+-------------+----------+---------+ Distal forearm       0.12        0.21             +-----------------+-------------+----------+---------+ *See table(s) above for measurements and observations.   Diagnosing physician: Servando Snare MD Electronically signed by Servando Snare MD on 02/28/2019 at 5:30:34 PM.    Final     Labs: BMET Recent Labs  Lab 02/26/19 1445 02/26/19 2212 02/27/19 0325 02/28/19 0556  NA 143  --  142 143  K 4.3  --  4.8 4.3  CL 111  --  110 108  CO2 20*  --  23 22  GLUCOSE 108*  --  190* 81  BUN 66*  --  67* 70*  CREATININE 5.00* 5.03* 5.03* 5.56*  CALCIUM 9.0  --  8.5* 8.7*  PHOS  --   --  4.9* 4.5   CBC Recent Labs  Lab 02/26/19 1445 02/26/19 2212  WBC 8.3 7.3  NEUTROABS 5.8  --   HGB 8.1* 7.9*  HCT 27.3* 26.1*  26.8*  MCV 88.3 88.5  PLT 234 223    Medications:    . aspirin  81 mg Oral Daily  . atorvastatin  80 mg Oral Daily  . carvedilol  3.125 mg Oral BID WC  . Chlorhexidine Gluconate Cloth  6 each Topical Q0600  . cholecalciferol  4,000 Units Oral Daily  . heparin  5,000 Units Subcutaneous Q8H  . hydrALAZINE  25 mg Oral  Q8H  . insulin aspart  0-5 Units Subcutaneous QHS  . insulin aspart  0-9 Units Subcutaneous TID WC  . insulin glargine  20 Units Subcutaneous QHS  . isosorbide mononitrate  30 mg Oral TID  . sodium bicarbonate  1,300 mg Oral BID  . sodium chloride flush  3 mL Intravenous Q12H      Otelia Santee, MD 03/01/2019, 7:04 AM

## 2019-03-01 NOTE — Plan of Care (Signed)
  Problem: Nutrition: Goal: Adequate nutrition will be maintained Outcome: Completed/Met   Problem: Coping: Goal: Level of anxiety will decrease Outcome: Completed/Met   Problem: Elimination: Goal: Will not experience complications related to bowel motility Outcome: Completed/Met   Problem: Pain Managment: Goal: General experience of comfort will improve Outcome: Completed/Met   

## 2019-03-01 NOTE — H&P (View-Only) (Signed)
  Progress Note    03/01/2019 8:18 AM   Subjective: No overnight complaints  Vitals:   03/01/19 0347 03/01/19 0600  BP: 118/60   Pulse: 91   Resp: 20   Temp: 97.8 F (36.6 C)   SpO2: (!) 88% 99%    Physical Exam: She is awake and alert Nonlabored respirations Palpable bilateral radial pulses IV in her right AC fossa  CBC    Component Value Date/Time   WBC 7.3 02/26/2019 2212   RBC 2.95 (L) 02/26/2019 2212   HGB 7.9 (L) 02/26/2019 2212   HCT 26.1 (L) 02/26/2019 2212   HCT 26.8 (L) 02/26/2019 2212   PLT 223 02/26/2019 2212   MCV 88.5 02/26/2019 2212   MCH 26.8 02/26/2019 2212   MCHC 30.3 02/26/2019 2212   RDW 14.2 02/26/2019 2212   LYMPHSABS 1.7 02/26/2019 1445   MONOABS 0.7 02/26/2019 1445   EOSABS 0.1 02/26/2019 1445   BASOSABS 0.0 02/26/2019 1445    BMET    Component Value Date/Time   NA 140 03/01/2019 0612   K 4.3 03/01/2019 0612   CL 107 03/01/2019 0612   CO2 24 03/01/2019 0612   GLUCOSE 146 (H) 03/01/2019 0612   BUN 74 (H) 03/01/2019 0612   CREATININE 5.81 (H) 03/01/2019 0612   CALCIUM 8.3 (L) 03/01/2019 0612   GFRNONAA 7 (L) 03/01/2019 0612   GFRAA 8 (L) 03/01/2019 0612    INR    Component Value Date/Time   INR 0.98 09/14/2018 2105     Intake/Output Summary (Last 24 hours) at 03/01/2019 0818 Last data filed at 03/01/2019 0813 Gross per 24 hour  Intake 1120 ml  Output 1110 ml  Net 10 ml     Assessment/plan:  68 y.o. female is here with new diagnosis end-stage renal disease.  We will plan tunneled dialysis catheter and right upper extremity access either fistula versus graft on Monday.  We need to restrict her right upper extremity which will require switching her IV to her left arm prior to Monday.  N.p.o. past midnight Sunday.  I discussed the risk and benefits as well as alternatives to proceeding with access surgery.  Patient does demonstrate very limited understanding but is agreeable to proceed.   Brissia Delisa C. Donzetta Matters, MD [facility] Office: [phone] Pager: [phone]  03/01/2019 8:18 AM

## 2019-03-01 NOTE — Progress Notes (Signed)
PROGRESS NOTE    Jenna Brown   KVQ:[id number]  DOB: Mar 05, 1951  DOA: 02/26/2019 PCP: [name], MD   Brief Narrative:  Jenna Brown  is a 68 y.o. female with medical history significant of coronary artery disease,  congestive heart failure with an ejection fraction of 45 to 50% in November 2019 with associated ischemic cardiomyopathy and hypokinesis, prior stroke, anemia, anxiety, right AKA, stage IV chronic kidney disease, gastroesophageal reflux disease, hypertension, hyperlipidemia, and type 2 diabetes who presents emergency department complaining of worsening dyspnea x3 days. In ED, Cr noted to be 5 from 3.90 on 09/16/18 Troponin 2.38 CXR> moderate pleural effusions Admitted for acute CHF and AKI. Nephrology consulted.   Subjective:   No complaint today. Understands she needs to start dialysis.     Assessment & Plan:   Principal Problem:   Acute on chronic systolic and diastolic congestive heart failure ,      Pleural effusion, bilateral   Acute respiratory failure with hypoxia   AKI on CKD 4-  - ECHO 11/19> 45% to 50%. Hypokinesis of the apicalinferolateral, inferior,   inferoseptal, and apical myocardium. - cardiology consulted- they ordered a thoracentesis on 6/10- Right thora > 1. 2 L  - I spoke with Dr Augustin Coupe who has decided to start dialysis- vascular surgery consulted- plan for fistula vs graft on Monday - NPO on Sunday night - IR consulted today for dialysis cath to be placed - on high dose Lasix for now - remains on Oxygen support  Active Problems: Metabolic acidosis - related to above  - on sodium bicarb at home which has been resumed  CAD s/p CABG - she stopped her Brillinta about 1 mo ago due to dyspnea - con ASA, Lipitor, Coreg, Imdur, Hydralazine- follow BP (slightly low)    Anxiety -cont Xanax PRN    Anemia likely of chronic disease  - anemia panel reviewed    Insulin-requiring or dependent type II diabetes mellitus  -cont Lantus - start  SSI  Right AKA   Time spent in minutes: 35 DVT prophylaxis:  Heparin Code Status: Full code Family Communication:  Disposition Plan: plan for dialysis and CLIP process Consultants:   Nephro  Cardiology  Vascular surgery  IR Procedures:   none Antimicrobials:  Anti-infectives (From admission, onward)   None       Objective: Vitals:   03/01/19 0027 03/01/19 0347 03/01/19 0553 03/01/19 0600  BP: (!) 103/48 118/60    Pulse: 80 91    Resp: 20 20    Temp: 98.3 F (36.8 C) 97.8 F (36.6 C)    TempSrc: Oral Oral    SpO2: 90% (!) 88%  99%  Weight:   74.3 kg   Height:        Intake/Output Summary (Last 24 hours) at 03/01/2019 1247 Last data filed at 03/01/2019 6433 Gross per 24 hour  Intake 646 ml  Output 1110 ml  Net -464 ml   Filed Weights   02/27/19 0350 02/28/19 0529 03/01/19 0553  Weight: 77.8 kg 76.3 kg 74.3 kg    Examination: General exam: Appears comfortable  HEENT: PERRLA, oral mucosa moist, no sclera icterus or thrush Respiratory system: crackles at bases Respiratory effort normal. Cardiovascular system: S1 & S2 heard,  No murmurs  Gastrointestinal system: Abdomen soft, non-tender, nondistended. Normal bowel sounds   Central nervous system: Alert and oriented. No focal neurological deficits. Extremities: No cyanosis, clubbing - 2 + edema- right AKA Skin: No rashes or ulcers Psychiatry:  Mood & affect appropriate.     Data Reviewed: I have personally reviewed following labs and imaging studies  CBC: Recent Labs  Lab 02/26/19 1445 02/26/19 2212  WBC 8.3 7.3  NEUTROABS 5.8  --   HGB 8.1* 7.9*  HCT 27.3* 26.1*   26.8*  MCV 88.3 88.5  PLT 234 009   Basic Metabolic Panel: Recent Labs  Lab 02/26/19 1445 02/26/19 2212 02/27/19 0325 02/28/19 0556 03/01/19 0612  NA 143  --  142 143 140  K 4.3  --  4.8 4.3 4.3  CL 111  --  110 108 107  CO2 20*  --  23 22 24   GLUCOSE 108*  --  190* 81 146*  BUN 66*  --  67* 70* 74*  CREATININE 5.00*  5.03* 5.03* 5.56* 5.81*  CALCIUM 9.0  --  8.5* 8.7* 8.3*  PHOS  --   --  4.9* 4.5 4.6   GFR: Estimated Creatinine Clearance: 9.5 mL/min (A) (by C-G formula based on SCr of 5.81 mg/dL (H)). Liver Function Tests: Recent Labs  Lab 02/26/19 1445 02/27/19 0325 02/28/19 0556 03/01/19 0612  AST 25  --   --   --   ALT 20  --   --   --   ALKPHOS 103  --   --   --   BILITOT 1.1  --   --   --   PROT 6.8  --   --   --   ALBUMIN 3.4* 3.0* 2.9* 2.8*   No results for input(s): LIPASE, AMYLASE in the last 168 hours. No results for input(s): AMMONIA in the last 168 hours. Coagulation Profile: Recent Labs  Lab 03/01/19 1150  INR 1.2   Cardiac Enzymes: Recent Labs  Lab 02/26/19 1445  TROPONINI 0.16*   BNP (last 3 results) No results for input(s): PROBNP in the last 8760 hours. HbA1C: No results for input(s): HGBA1C in the last 72 hours. CBG: Recent Labs  Lab 02/28/19 1132 02/28/19 1636 02/28/19 2156 03/01/19 0532 03/01/19 1123  GLUCAP 154* 109* 192* 163* 110*   Lipid Profile: No results for input(s): CHOL, HDL, LDLCALC, TRIG, CHOLHDL, LDLDIRECT in the last 72 hours. Thyroid Function Tests: No results for input(s): TSH, T4TOTAL, FREET4, T3FREE, THYROIDAB in the last 72 hours. Anemia Panel: Recent Labs    02/26/19 2212  VITAMINB12 784  FERRITIN 78  TIBC 300  IRON 25*   Urine analysis:    Component Value Date/Time   COLORURINE YELLOW 09/14/2018 2330   APPEARANCEUR CLOUDY (A) 09/14/2018 2330   LABSPEC 1.014 09/14/2018 2330   PHURINE 7.0 09/14/2018 2330   GLUCOSEU 50 (A) 09/14/2018 2330   HGBUR NEGATIVE 09/14/2018 2330   BILIRUBINUR NEGATIVE 09/14/2018 2330   KETONESUR 5 (A) 09/14/2018 2330   PROTEINUR 100 (A) 09/14/2018 2330   UROBILINOGEN 0.2 04/10/2015 1248   NITRITE NEGATIVE 09/14/2018 2330   LEUKOCYTESUR TRACE (A) 09/14/2018 2330   Sepsis Labs: @LABRCNTIP (procalcitonin:4,lacticidven:4) ) Recent Results (from the past 240 hour(s))  Novel Coronavirus, NAA  (hospital order; send-out to ref lab)     Status: None   Collection Time: 02/26/19  5:38 PM   Specimen: Nasopharyngeal Swab; Respiratory  Result Value Ref Range Status   SARS-CoV-2, NAA NOT DETECTED NOT DETECTED Final    Comment: (NOTE) This test was developed and its performance characteristics determined by [employer]. This test has not been FDA cleared or approved. This test has been authorized by FDA under an Emergency Use Authorization (EUA). This test is  only authorized for the duration of time the declaration that circumstances exist justifying the authorization of the emergency use of in vitro diagnostic tests for detection of SARS-CoV-2 virus and/or diagnosis of COVID-19 infection under section 564(b)(1) of the Act, 21 U.S.C. 361WER-1(V)(4), unless the authorization is terminated or revoked sooner. When diagnostic testing is negative, the possibility of a false negative result should be considered in the context of a patient's recent exposures and the presence of clinical signs and symptoms consistent with COVID-19. An individual without symptoms of COVID-19 and who is not shedding SARS-CoV-2 virus would expect to have a negative (not detected) result in this assay. Performed  At: [facility] [address] [place], [place] [id number] Rush Farmer MD KD:[id number]    [place]  Final    Comment: Performed at [facility], [place] [address], [place], [place] [address]         Radiology Studies: Dg Chest [facility] 1 View  Result Date: 02/27/2019 CLINICAL DATA:  Pleural effusion, history of coronary artery disease post MI, GERD, hypertension EXAM: PORTABLE CHEST 1 VIEW COMPARISON:  Portable exam 1609 hours compared to 02/26/2019 FINDINGS: Enlargement of cardiac silhouette with slight vascular congestion. Mediastinal contours normal. Bibasilar atelectasis and small LEFT pleural effusion. Decrease in RIGHT basilar opacity since  previous exam. Question minimal perihilar edema. No pneumothorax. IMPRESSION: Enlargement of cardiac silhouette with question minimal pulmonary edema. Persistent small LEFT pleural effusion with bibasilar opacities, favor atelectasis. Electronically Signed   By: Lavonia Dana M.D.   On: 02/27/2019 16:35   Ir US Chest  Result Date: 02/28/2019 CLINICAL DATA:  Assess for pleural effusions EXAM: ULTRASOUND OF CHEST SOFT TISSUES TECHNIQUE: Ultrasound examination of the chest wall soft tissues was performed in the area of clinical concern. COMPARISON:  02/27/2019 FINDINGS: Ultrasound of the posterior chest performed with the patient upright. No significant right pleural effusion. Small non loculated left pleural effusion. IMPRESSION: Small non loculated left pleural effusion. Recommend repeat chest x-ray tomorrow and if further progression reconsider thoracentesis. Electronically Signed   By: Jerilynn Mages.  Shick M.D.   On: 02/28/2019 16:53   Vas [place] Upper Ext Vein Mapping (pre-op Avf)  Result Date: 02/28/2019 UPPER EXTREMITY VEIN MAPPING  Indications: Pre-access. Performing Technologist: Oliver Hum RVT  Examination Guidelines: A complete evaluation includes B-mode imaging, spectral Doppler, color Doppler, and power Doppler as needed of all accessible portions of each vessel. Bilateral testing is considered an integral part of a complete examination. Limited examinations for reoccurring indications may be performed as noted. +-----------------+-------------+----------+----------------------------------+  Right Cephalic    Diameter (cm) Depth (cm)              Findings               +-----------------+-------------+----------+----------------------------------+  Shoulder              0.41         1.15                                        +-----------------+-------------+----------+----------------------------------+  Prox upper arm        0.34         1.15                                         +-----------------+-------------+----------+----------------------------------+  Mid upper arm         0.38         1.03                branching               +-----------------+-------------+----------+----------------------------------+  Dist upper arm        0.37         0.74                                        +-----------------+-------------+----------+----------------------------------+  Antecubital fossa     0.44         0.61                                        +-----------------+-------------+----------+----------------------------------+  Prox forearm                               not visualized and IV and bandages  +-----------------+-------------+----------+----------------------------------+  Mid forearm           0.18         0.43                                        +-----------------+-------------+----------+----------------------------------+  Dist forearm          0.26         0.20                branching               +-----------------+-------------+----------+----------------------------------+ +-----------------+-------------+----------+---------+  Right Basilic     Diameter (cm) Depth (cm) Findings   +-----------------+-------------+----------+---------+  Shoulder              0.46         0.88               +-----------------+-------------+----------+---------+  Mid upper arm         0.46         1.02               +-----------------+-------------+----------+---------+  Dist upper arm        0.23         1.08    branching  +-----------------+-------------+----------+---------+  Antecubital fossa     0.18         0.31               +-----------------+-------------+----------+---------+  Prox forearm          0.22         0.53               +-----------------+-------------+----------+---------+  Mid forearm           0.19         0.24               +-----------------+-------------+----------+---------+  Distal forearm        0.19         0.23                +-----------------+-------------+----------+---------+ +-----------------+-------------+----------+---------+  Left Cephalic     Diameter (cm)  Depth (cm) Findings   +-----------------+-------------+----------+---------+  Shoulder              0.46         1.07               +-----------------+-------------+----------+---------+  Prox upper arm        0.22         0.95               +-----------------+-------------+----------+---------+  Mid upper arm         0.14         0.85    branching  +-----------------+-------------+----------+---------+  Dist upper arm        0.13         0.76               +-----------------+-------------+----------+---------+  Antecubital fossa     0.15         0.54               +-----------------+-------------+----------+---------+  Prox forearm          0.12         0.34    branching  +-----------------+-------------+----------+---------+  Mid forearm           0.13         0.25               +-----------------+-------------+----------+---------+  Dist forearm          0.08         0.25               +-----------------+-------------+----------+---------+ +-----------------+-------------+----------+---------+  Left Basilic      Diameter (cm) Depth (cm) Findings   +-----------------+-------------+----------+---------+  Shoulder              0.24         1.00               +-----------------+-------------+----------+---------+  Mid upper arm         0.24         0.90               +-----------------+-------------+----------+---------+  Dist upper arm        0.20         0.70               +-----------------+-------------+----------+---------+  Antecubital fossa     0.12         0.64    branching  +-----------------+-------------+----------+---------+  Prox forearm          0.15         0.55               +-----------------+-------------+----------+---------+  Mid forearm           0.16         0.16               +-----------------+-------------+----------+---------+  Distal forearm         0.12         0.21               +-----------------+-------------+----------+---------+ *See table(s) above for measurements and observations.   Diagnosing physician: Servando Snare MD Electronically signed by Servando Snare MD on 02/28/2019 at 5:30:34 PM.    Final       Scheduled Meds:  aspirin  81 mg Oral Daily   atorvastatin  80 mg Oral Daily   carvedilol  3.125 mg Oral BID WC   Chlorhexidine Gluconate Cloth  6 each Topical Q0600   cholecalciferol  4,000 Units Oral Daily   darbepoetin (ARANESP) injection - DIALYSIS  100 mcg Intravenous Once   hydrALAZINE  25 mg Oral Q8H   insulin aspart  0-5 Units Subcutaneous QHS   insulin aspart  0-9 Units Subcutaneous TID WC   insulin glargine  20 Units Subcutaneous QHS   isosorbide mononitrate  30 mg Oral TID   sodium chloride flush  3 mL Intravenous Q12H   Continuous Infusions:  sodium chloride     sodium chloride Stopped (02/27/19 2224)   furosemide 160 mg (03/01/19 0801)     LOS: 3 days      Debbe Odea, MD [facility] Pager: [email or web address] Password [facility] 03/01/2019, 12:47 PM

## 2019-03-01 NOTE — Sedation Documentation (Signed)
Pt in IR room 1.  Supine on table, secured with strap.  Pt placed on cont cardiac monitoring and 6L O2 via [place] for procedure

## 2019-03-01 NOTE — Care Management Important Message (Signed)
Important Message  Patient Details  Name: Jenna Brown MRN: [id number] Date of Birth: [birth year]   Medicare Important Message Given:  Yes    Shelda Altes 03/01/2019, 11:39 AM

## 2019-03-01 NOTE — Consult Note (Signed)
Chief Complaint: Patient was seen in consultation today for renal failure  Referring Physician(s): Dr. Augustin Coupe  Supervising Physician: Daryll Brod  Patient Status: [facility] - Out-pt  History of Present Illness: Jenna Brown is a 68 y.o. female with history of inoperable multivessel CAD, HLD, HTN, NSTEMI, and CKD stable IV who presented to [facility] ED with swelling and shortness of breath.  Patient with progression of her chronic kidney disease.  She is now in need of dialysis.  Request made for tunneled dialysis catheter placement at the request of Dr. Augustin Coupe.   Patient assessed in radiology.  She is aware she is in need of dialysis.  States she has had extensive conversations with MD and family. Patient is NPO as of 730 this AM.  On SQ heparin.  Past Medical History:  Diagnosis Date   Anemia    Anxiety    takes Xanax prn   Arthritis    CKD (chronic kidney disease), stage IV (HCC)    Coronary artery disease 2016   cath w/ 90% LAD, 95%CFX, 80% OM 2, 60% RCA, not CABG candidate, rx medically   GERD (gastroesophageal reflux disease)    H/O hiatal hernia    Headache(784.0)    when b/p is elevated   Hemorrhoids    Hx of AKA (above knee amputation), right (HCC)    Hyperlipidemia    takes Simvastatin daily   Hypertension    takes Amlodipine/HCTZ/Losartan daily   Migraines    Myocardial infarction [facility]) 1990's   NSTEMI (non-ST elevated myocardial infarction) ([place])    Peripheral vascular disease (HCC)    PONV (postoperative nausea and vomiting)    Stroke ([place])    TIA history   Type II diabetes mellitus ([place])    on Lantus   Vertigo    takes Ativert prn    Past Surgical History:  Procedure Laterality Date   ABDOMINAL AORTAGRAM N/A 02/29/2012   Procedure: ABDOMINAL Maxcine Ham;  Surgeon: [name], MD;  Location: [facility] CATH LAB;  Service: Cardiovascular;  Laterality: N/A;   AMPUTATION  05/10/2012   Procedure: AMPUTATION ABOVE KNEE;  Surgeon: [name], MD;   Location: [facility] OR;  Service: Vascular;  Laterality: Right;   open right groin wound noted   aortogram  02/2012   CARDIAC CATHETERIZATION N/A 04/13/2015   Procedure: Right/Left Heart Cath and Coronary Angiography;  Surgeon: [name], MD;  Location: [facility] CV LAB;  Service: Cardiovascular;  Laterality: N/A;   CLEFT PALATE REPAIR     several   COLONOSCOPY     DILATION AND CURETTAGE OF UTERUS     FEMORAL-POPLITEAL BYPASS GRAFT  04/05/2012   Procedure: BYPASS GRAFT FEMORAL-POPLITEAL ARTERY;  Surgeon: [name], MD;  Location: [place];  Service: Vascular;  Laterality: Right;  REVISION   FEMORAL-TIBIAL BYPASS GRAFT  03/29/2012   Procedure: BYPASS GRAFT FEMORAL-TIBIAL ARTERY;  Surgeon: [name], MD;  Location: [place] OR;  Service: Vascular;  Laterality: Right;  Right femoral to Posterior Tibialis with composite graft of 11mm x 80 cm ringed gortex graft and saphenous vein  ,intraoperative arteriogram   FEMOROPOPLITEAL THROMBECTOMY / EMBOLECTOMY  05/09/2012   MYOMECTOMY     PR VEIN BYPASS GRAFT,AORTO-FEM-POP  05/27/11   Right SFA-Below knee Pop BP   TEE WITHOUT CARDIOVERSION N/A 01/03/2017   Procedure: TRANSESOPHAGEAL ECHOCARDIOGRAM (TEE);  Surgeon: Skeet Latch, MD;  Location: [facility] ENDOSCOPY;  Service: Cardiovascular;  Laterality: N/A;   THORACENTESIS  02/27/2019       TONSILLECTOMY  TUBAL LIGATION  ~ 1990    Allergies: Plavix [clopidogrel bisulfate], Codeine, and Tylenol with codeine #3 [acetaminophen-codeine]  Medications: Prior to Admission medications   Medication Sig Start Date End Date Taking? Authorizing Provider  acetaminophen (TYLENOL) 500 MG tablet Take 500 mg by mouth every 6 (six) hours as needed for mild pain.   Yes [provider]  ALPRAZolam (XANAX) 0.25 MG tablet Take 1 tablet (0.25 mg total) by mouth 3 (three) times daily as needed for anxiety. Patient taking differently: Take 0.25 mg by mouth daily as needed for anxiety.  01/03/17  Yes  Carlota Raspberry, Tiffany, PA-C  amLODipine (NORVASC) 10 MG tablet Take 1 tablet (10 mg total) by mouth daily. 11/16/18  Yes Skeet Latch, MD  aspirin 81 MG chewable tablet Chew 81 mg by mouth daily.   Yes [provider]  atorvastatin (LIPITOR) 80 MG tablet Take 80 mg by mouth daily.  01/09/18  Yes [provider]  Cholecalciferol (VITAMIN D-3 PO) Take 4,000 Units by mouth daily.    Yes [provider]  doxazosin (CARDURA) 1 MG tablet Take 1 tablet (1 mg total) by mouth at bedtime. 09/16/18  Yes Mikhail, [place], DO  ferrous sulfate 325 (65 FE) MG tablet Take 957 mg by mouth daily with breakfast.    Yes [provider]  furosemide (LASIX) 40 MG tablet Take 1 tablet (40 mg total) by mouth daily as needed. Patient taking differently: Take 40 mg by mouth every other day.  01/29/18  Yes [name], PA-C  hydrALAZINE (APRESOLINE) 100 MG tablet Take 1 tablet (100 mg total) by mouth 3 (three) times daily. Patient taking differently: Take 50 mg by mouth 3 (three) times daily.  11/16/18  Yes Skeet Latch, MD  insulin glargine (LANTUS) 100 UNIT/ML injection Inject 0.25 mLs (25 Units total) into the skin 2 (two) times daily. Patient taking differently: Inject 20 Units into the skin at bedtime.  01/03/17  Yes Carlota Raspberry, Tiffany, PA-C  isosorbide mononitrate (IMDUR) 120 MG 24 hr tablet Take 1 tablet (120 mg total) by mouth daily. Patient taking differently: Take 30 mg by mouth 3 (three) times daily.  01/21/19  Yes Bensimhon, Shaune Pascal, MD  losartan (COZAAR) 100 MG tablet Take 100 mg by mouth daily.   Yes [provider]  nitroGLYCERIN (NITROSTAT) 0.4 MG SL tablet Place 1 tablet (0.4 mg total) under the tongue every 5 (five) minutes x 3 doses as needed for chest pain. 08/13/15  Yes Theora Gianotti, NP  sodium bicarbonate 650 MG tablet Take 1,300 mg by mouth 2 (two) times daily.   Yes [provider]  sodium polystyrene (KAYEXALATE) 15 GM/60ML  suspension Take 7.5 mg by mouth once.   Yes [provider]     Family History  Problem Relation Age of Onset   Cancer Mother        breast   Diabetes Father    Cancer Brother    Heart attack Maternal Grandmother    Heart attack Maternal Grandfather     Social History   Socioeconomic History   Marital status: Single    Spouse name: Not on file   Number of children: Not on file   Years of education: Not on file   Highest education level: Not on file  Occupational History   Occupation: disabled  Social Needs   Financial resource strain: Not on file   Food insecurity    Worry: Not on file    Inability: Not on file  Transportation needs    Medical: Not on file    Non-medical: Not on file  Tobacco Use   Smoking status: Former Smoker    Packs/day: 0.25    Years: 40.00    Pack years: 10.00    Types: Cigarettes    Quit date: 02/27/2012    Years since quitting: 7.0   Smokeless tobacco: Never Used  Substance and Sexual Activity   Alcohol use: No   Drug use: No   Sexual activity: Never    Birth control/protection: Post-menopausal  Lifestyle   Physical activity    Days per week: Not on file    Minutes per session: Not on file   Stress: Not on file  Relationships   Social connections    Talks on phone: Not on file    Gets together: Not on file    Attends religious service: Not on file    Active member of club or organization: Not on file    Attends meetings of clubs or organizations: Not on file    Relationship status: Not on file  Other Topics Concern   Not on file  Social History Narrative   She lives in [place] with family.     Review of Systems: A 12 point ROS discussed and pertinent positives are indicated in the HPI above.  All other systems are negative.  Review of Systems  Constitutional: Negative for fatigue and fever.  Respiratory: Negative for cough and shortness of breath.   Cardiovascular: Negative for chest pain.    Gastrointestinal: Negative for abdominal pain.  Musculoskeletal: Negative for back pain.  Psychiatric/Behavioral: Negative for behavioral problems and confusion.    Vital Signs: BP 115/64 (BP Location: Left Arm)    Pulse 84    Temp 97.8 F (36.6 C) (Oral)    Resp (!) 23    Ht 5\' 4"  (1.626 m)    Wt 163 lb 12.8 oz (74.3 kg)    SpO2 98%    BMI 28.12 kg/m   Physical Exam Vitals signs and nursing note reviewed.  Constitutional:      Appearance: She is well-developed.  HENT:     Mouth/Throat:     Mouth: Mucous membranes are moist.     Pharynx: Oropharynx is clear.  Neck:     Musculoskeletal: Normal range of motion and neck supple.  Cardiovascular:     Rate and Rhythm: Normal rate and regular rhythm.  Pulmonary:     Effort: Pulmonary effort is normal.     Breath sounds: Normal breath sounds.  Skin:    General: Skin is warm and dry.  Neurological:     General: No focal deficit present.     Mental Status: She is alert and oriented to person, place, and time.  Psychiatric:        Mood and Affect: Mood normal. Mood is not anxious.        Behavior: Behavior normal. Behavior is not agitated.      MD Evaluation Airway: WNL Heart: WNL Abdomen: WNL Chest/ Lungs: WNL ASA  Classification: 3 Mallampati/Airway Score: One   Imaging: Dg Chest Port 1 View  Result Date: 02/27/2019 CLINICAL DATA:  Pleural effusion, history of coronary artery disease post MI, GERD, hypertension EXAM: PORTABLE CHEST 1 VIEW COMPARISON:  Portable exam 1609 hours compared to 02/26/2019 FINDINGS: Enlargement of cardiac silhouette with slight vascular congestion. Mediastinal contours normal. Bibasilar atelectasis and small LEFT pleural effusion. Decrease in RIGHT basilar opacity since previous exam. Question minimal perihilar edema. No  pneumothorax. IMPRESSION: Enlargement of cardiac silhouette with question minimal pulmonary edema. Persistent small LEFT pleural effusion with bibasilar opacities, favor  atelectasis. Electronically Signed   By: Lavonia Dana M.D.   On: 02/27/2019 16:35   Dg Chest Portable 1 View  Result Date: 02/26/2019 CLINICAL DATA:  Hypoxia, acute dyspnea. EXAM: PORTABLE CHEST 1 VIEW COMPARISON:  Radiograph of September 14, 2018. FINDINGS: Stable cardiomediastinal silhouette. Moderate bilateral pleural effusions are noted with associated atelectasis or edema. No pneumothorax is noted. Bony thorax is unremarkable. IMPRESSION: Interval development of moderate pleural effusions with associated atelectasis or edema. Electronically Signed   By: Marijo Conception M.D.   On: 02/26/2019 14:56   Ir US Chest  Result Date: 02/28/2019 CLINICAL DATA:  Assess for pleural effusions EXAM: ULTRASOUND OF CHEST SOFT TISSUES TECHNIQUE: Ultrasound examination of the chest wall soft tissues was performed in the area of clinical concern. COMPARISON:  02/27/2019 FINDINGS: Ultrasound of the posterior chest performed with the patient upright. No significant right pleural effusion. Small non loculated left pleural effusion. IMPRESSION: Small non loculated left pleural effusion. Recommend repeat chest x-ray tomorrow and if further progression reconsider thoracentesis. Electronically Signed   By: Jerilynn Mages.  Shick M.D.   On: 02/28/2019 16:53   Vas [place] Lower Extremity Venous (dvt) (mc And Wl 7a-7p)  Result Date: 02/26/2019  Lower Venous Study Indications: Edema.  Risk Factors: CHF. Limitations: Poor ultrasound/tissue interface. Comparison Study: No recent studies. Performing Technologist: Oliver Hum RVT  Examination Guidelines: A complete evaluation includes B-mode imaging, spectral Doppler, color Doppler, and power Doppler as needed of all accessible portions of each vessel. Bilateral testing is considered an integral part of a complete examination. Limited examinations for reoccurring indications may be performed as noted.  +-----+---------------+---------+-----------+----------+-------+   RIGHT Compressibility Phasicity Spontaneity Properties Summary  +-----+---------------+---------+-----------+----------+-------+  CFV   Full            Yes       Yes                             +-----+---------------+---------+-----------+----------+-------+   +---------+---------------+---------+-----------+----------+-------+  LEFT      Compressibility Phasicity Spontaneity Properties Summary  +---------+---------------+---------+-----------+----------+-------+  CFV       Full            Yes       Yes                             +---------+---------------+---------+-----------+----------+-------+  SFJ       Full                                                      +---------+---------------+---------+-----------+----------+-------+  FV Prox   Full                                                      +---------+---------------+---------+-----------+----------+-------+  FV Mid    Full                                                      +---------+---------------+---------+-----------+----------+-------+  FV Distal Full                                                      +---------+---------------+---------+-----------+----------+-------+  PFV       Full                                                      +---------+---------------+---------+-----------+----------+-------+  POP       Full            Yes       Yes                             +---------+---------------+---------+-----------+----------+-------+  PTV       Full                                                      +---------+---------------+---------+-----------+----------+-------+  PERO      Full                                                      +---------+---------------+---------+-----------+----------+-------+     Summary: Right: No evidence of common femoral vein obstruction. Left: There is no evidence of deep vein thrombosis in the lower extremity. No cystic structure found in the popliteal fossa.  *See table(s) above for measurements and  observations. Electronically signed by Harold Barban MD on 02/26/2019 at 33:19:35 PM.    Final    Vas [place] Upper Ext Vein Mapping (pre-op Avf)  Result Date: 02/28/2019 UPPER EXTREMITY VEIN MAPPING  Indications: Pre-access. Performing Technologist: Oliver Hum RVT  Examination Guidelines: A complete evaluation includes B-mode imaging, spectral Doppler, color Doppler, and power Doppler as needed of all accessible portions of each vessel. Bilateral testing is considered an integral part of a complete examination. Limited examinations for reoccurring indications may be performed as noted. +-----------------+-------------+----------+----------------------------------+  Right Cephalic    Diameter (cm) Depth (cm)              Findings               +-----------------+-------------+----------+----------------------------------+  Shoulder              0.41         1.15                                        +-----------------+-------------+----------+----------------------------------+  Prox upper arm        0.34         1.15                                        +-----------------+-------------+----------+----------------------------------+  Mid upper arm         0.38         1.03                branching               +-----------------+-------------+----------+----------------------------------+  Dist upper arm        0.37         0.74                                        +-----------------+-------------+----------+----------------------------------+  Antecubital fossa     0.44         0.61                                        +-----------------+-------------+----------+----------------------------------+  Prox forearm                               not visualized and IV and bandages  +-----------------+-------------+----------+----------------------------------+  Mid forearm           0.18         0.43                                         +-----------------+-------------+----------+----------------------------------+  Dist forearm          0.26         0.20                branching               +-----------------+-------------+----------+----------------------------------+ +-----------------+-------------+----------+---------+  Right Basilic     Diameter (cm) Depth (cm) Findings   +-----------------+-------------+----------+---------+  Shoulder              0.46         0.88               +-----------------+-------------+----------+---------+  Mid upper arm         0.46         1.02               +-----------------+-------------+----------+---------+  Dist upper arm        0.23         1.08    branching  +-----------------+-------------+----------+---------+  Antecubital fossa     0.18         0.31               +-----------------+-------------+----------+---------+  Prox forearm          0.22         0.53               +-----------------+-------------+----------+---------+  Mid forearm           0.19         0.24               +-----------------+-------------+----------+---------+  Distal forearm        0.19         0.23               +-----------------+-------------+----------+---------+ +-----------------+-------------+----------+---------+  Left Cephalic     Diameter (cm)  Depth (cm) Findings   +-----------------+-------------+----------+---------+  Shoulder              0.46         1.07               +-----------------+-------------+----------+---------+  Prox upper arm        0.22         0.95               +-----------------+-------------+----------+---------+  Mid upper arm         0.14         0.85    branching  +-----------------+-------------+----------+---------+  Dist upper arm        0.13         0.76               +-----------------+-------------+----------+---------+  Antecubital fossa     0.15         0.54               +-----------------+-------------+----------+---------+  Prox forearm          0.12         0.34    branching   +-----------------+-------------+----------+---------+  Mid forearm           0.13         0.25               +-----------------+-------------+----------+---------+  Dist forearm          0.08         0.25               +-----------------+-------------+----------+---------+ +-----------------+-------------+----------+---------+  Left Basilic      Diameter (cm) Depth (cm) Findings   +-----------------+-------------+----------+---------+  Shoulder              0.24         1.00               +-----------------+-------------+----------+---------+  Mid upper arm         0.24         0.90               +-----------------+-------------+----------+---------+  Dist upper arm        0.20         0.70               +-----------------+-------------+----------+---------+  Antecubital fossa     0.12         0.64    branching  +-----------------+-------------+----------+---------+  Prox forearm          0.15         0.55               +-----------------+-------------+----------+---------+  Mid forearm           0.16         0.16               +-----------------+-------------+----------+---------+  Distal forearm        0.12         0.21               +-----------------+-------------+----------+---------+ *See table(s) above for measurements and observations.   Diagnosing physician: Servando Snare MD Electronically signed by Servando Snare MD on 02/28/2019 at 5:30:34 PM.    Final     Labs:  CBC: Recent Labs    09/15/18 0522 09/16/18 0536 02/26/19 1445 02/26/19 2212  WBC 11.4* 8.8 8.3 7.3  HGB 8.1* 8.0* 8.1* 7.9*  HCT 28.7* 28.3* 27.3* 26.1*   26.8*  PLT 258 246 234 223    COAGS: Recent Labs    04/20/18 0313 09/14/18 2105 03/01/19 1150  INR 1.16 0.98 1.2    BMP: Recent Labs    02/26/19 1445 02/26/19 2212 02/27/19 0325 02/28/19 0556 03/01/19 0612  NA 143  --  142 143 140  K 4.3  --  4.8 4.3 4.3  CL 111  --  110 108 107  CO2 20*  --  23 22 24   GLUCOSE 108*  --  190* 81 146*  BUN 66*  --  67* 70* 74*   CALCIUM 9.0  --  8.5* 8.7* 8.3*  CREATININE 5.00* 5.03* 5.03* 5.56* 5.81*  GFRNONAA 8* 8* 8* 7* 7*  GFRAA 10* 10* 10* 9* 8*    LIVER FUNCTION TESTS: Recent Labs    09/14/18 2105 02/26/19 1445 02/27/19 0325 02/28/19 0556 03/01/19 0612  BILITOT 0.8 1.1  --   --   --   AST 32 25  --   --   --   ALT 38 20  --   --   --   ALKPHOS 83 103  --   --   --   PROT 6.3* 6.8  --   --   --   ALBUMIN 3.2* 3.4* 3.0* 2.9* 2.8*    TUMOR MARKERS: No results for input(s): AFPTM, CEA, CA199, CHROMGRNA in the last 8760 hours.  Assessment and Plan: Chronic kidney disease, stage IV with recent progression Patient in need of dialysis.  Request for tunneled HD catheter placement.  Patient has plans for evaluation with vascular surgery as well.  Plan to proceed with tunneled dialysis catheter placement in IR today.   Risks and benefits discussed with the patient including, but not limited to bleeding, infection, vascular injury, pneumothorax which may require chest tube placement, air embolism or even death  All of the patient's questions were answered, patient is agreeable to proceed. Consent signed and in chart.   Thank you for this interesting consult.  I greatly enjoyed meeting Jenna Brown and look forward to participating in their care.  A copy of this report was sent to the requesting provider on this date.  Electronically Signed: Docia Barrier, PA 03/01/2019, 1:33 PM   I spent a total of 40 Minutes    in face to face in clinical consultation, greater than 50% of which was counseling/coordinating care for renal failure.

## 2019-03-01 NOTE — Progress Notes (Signed)
Patient has been accepted at [facility] in [facility] for OP HD treatment on a TTS schedule with a seat time of 11:30am. Renal Navigator notified Nephrologist/Dr. Augustin Coupe and patient, who requested that Renal Navigator call one of her sisters, Ivin Booty or Bertram Millard (listed as ER contacts in [place]). Renal Navigator spoke with patient's sister Ivin Booty [phone] who states she will pass along information to her cousin Ogechi Kuehnel, who takes patient to all of her appointments. Ivin Booty states understanding of patient's OP HD schedule and agrees to be point of contact for Renal Navigator regarding patient's start date in the clinic once discharge from the hospital has been determined. Renal Navigator provided contact information for clinic social worker/[place] for family support, as Caprice Red has handled referral by Renal Navigator. Caprice Red can be reached at [phone]. Ivin Booty states appreciation. Renal Navigator will watch for discharge and notify patient's sister and OP HD clinic of patient's start date in clinic.  Alphonzo Cruise, [place]  Renal Navigator [phone]

## 2019-03-01 NOTE — Progress Notes (Signed)
Progress Note  Patient Name: Jenna Brown Date of Encounter: 03/01/2019  Primary Cardiologist:  Skeet Latch, MD  Subjective   Breathing so much better after thoracentesis, worried about going on HD but will do it.   Inpatient Medications    Scheduled Meds:  aspirin  81 mg Oral Daily   atorvastatin  80 mg Oral Daily   carvedilol  3.125 mg Oral BID WC   Chlorhexidine Gluconate Cloth  6 each Topical Q0600   cholecalciferol  4,000 Units Oral Daily   darbepoetin (ARANESP) injection - DIALYSIS  100 mcg Intravenous Once   heparin  5,000 Units Subcutaneous Q8H   hydrALAZINE  25 mg Oral Q8H   insulin aspart  0-5 Units Subcutaneous QHS   insulin aspart  0-9 Units Subcutaneous TID WC   insulin glargine  20 Units Subcutaneous QHS   isosorbide mononitrate  30 mg Oral TID   sodium chloride flush  3 mL Intravenous Q12H   Continuous Infusions:  sodium chloride     sodium chloride Stopped (02/27/19 2224)   furosemide 160 mg (03/01/19 0801)   PRN Meds: sodium chloride, sodium chloride, acetaminophen, ALPRAZolam, benzonatate, nitroGLYCERIN, ondansetron (ZOFRAN) IV, sodium chloride flush, zolpidem   Vital Signs    Vitals:   03/01/19 0027 03/01/19 0347 03/01/19 0553 03/01/19 0600  BP: (!) 103/48 118/60    Pulse: 80 91    Resp: 20 20    Temp: 98.3 F (36.8 C) 97.8 F (36.6 C)    TempSrc: Oral Oral    SpO2: 90% (!) 88%  99%  Weight:   74.3 kg   Height:        Intake/Output Summary (Last 24 hours) at 03/01/2019 1046 Last data filed at 03/01/2019 7341 Gross per 24 hour  Intake 646 ml  Output 1110 ml  Net -464 ml   Filed Weights   02/27/19 0350 02/28/19 0529 03/01/19 0553  Weight: 77.8 kg 76.3 kg 74.3 kg   Last Weight  Most recent update: 03/01/2019  6:51 AM   Weight  74.3 kg (163 lb 12.8 oz)           Weight change: -1.996 kg   Telemetry    SR, occ PVCs - Personally Reviewed  ECG    None today - Personally Reviewed  Physical Exam    General: Well developed, well nourished, female appearing in no acute distress. Head: Normocephalic, atraumatic.  Neck: Supple without bruits, JVD approx 9 cm. Lungs:  Resp regular and unlabored, rales R, bronchial BS L Heart: RRR, S1, S2, no S3, S4, or murmur; no rub. Abdomen: Soft, non-tender, non-distended with normoactive bowel sounds. No hepatomegaly. No rebound/guarding. No obvious abdominal masses. Extremities: No clubbing, cyanosis, 2-3+ LLE edema. Distal radial pulses are 2+ bilaterally. Neuro: Alert and oriented X 3. Moves all extremities spontaneously. Psych: Normal affect.  Labs    Hematology Recent Labs  Lab 02/26/19 1445 02/26/19 2212  WBC 8.3 7.3  RBC 3.09* 2.95*  HGB 8.1* 7.9*  HCT 27.3* 26.1*   26.8*  MCV 88.3 88.5  MCH 26.2 26.8  MCHC 29.7* 30.3  RDW 14.1 14.2  PLT 234 223    Chemistry Recent Labs  Lab 02/26/19 1445  02/27/19 0325 02/28/19 0556 03/01/19 0612  NA 143  --  142 143 140  K 4.3  --  4.8 4.3 4.3  CL 111  --  110 108 107  CO2 20*  --  23 22 24   GLUCOSE 108*  --  190* 81  146*  BUN 66*  --  67* 70* 74*  CREATININE 5.00*   < > 5.03* 5.56* 5.81*  CALCIUM 9.0  --  8.5* 8.7* 8.3*  PROT 6.8  --   --   --   --   ALBUMIN 3.4*  --  3.0* 2.9* 2.8*  AST 25  --   --   --   --   ALT 20  --   --   --   --   ALKPHOS 103  --   --   --   --   BILITOT 1.1  --   --   --   --   GFRNONAA 8*   < > 8* 7* 7*  GFRAA 10*   < > 10* 9* 8*  ANIONGAP 12  --  9 13 9    < > = values in this interval not displayed.     Cardiac Enzymes Recent Labs  Lab 02/26/19 1445  TROPONINI 0.16*      BNP Recent Labs  Lab 02/26/19 1445  BNP 1,190.5*     Radiology    Dg Chest Port 1 View  Result Date: 02/27/2019 CLINICAL DATA:  Pleural effusion, history of coronary artery disease post MI, GERD, hypertension EXAM: PORTABLE CHEST 1 VIEW COMPARISON:  Portable exam 1609 hours compared to 02/26/2019 FINDINGS: Enlargement of cardiac silhouette with slight vascular  congestion. Mediastinal contours normal. Bibasilar atelectasis and small LEFT pleural effusion. Decrease in RIGHT basilar opacity since previous exam. Question minimal perihilar edema. No pneumothorax. IMPRESSION: Enlargement of cardiac silhouette with question minimal pulmonary edema. Persistent small LEFT pleural effusion with bibasilar opacities, favor atelectasis. Electronically Signed   By: Lavonia Dana M.D.   On: 02/27/2019 16:35   Dg Chest Portable 1 View  Result Date: 02/26/2019 CLINICAL DATA:  Hypoxia, acute dyspnea. EXAM: PORTABLE CHEST 1 VIEW COMPARISON:  Radiograph of September 14, 2018. FINDINGS: Stable cardiomediastinal silhouette. Moderate bilateral pleural effusions are noted with associated atelectasis or edema. No pneumothorax is noted. Bony thorax is unremarkable. IMPRESSION: Interval development of moderate pleural effusions with associated atelectasis or edema. Electronically Signed   By: Marijo Conception M.D.   On: 02/26/2019 14:56   Ir US Chest  Result Date: 02/28/2019 CLINICAL DATA:  Assess for pleural effusions EXAM: ULTRASOUND OF CHEST SOFT TISSUES TECHNIQUE: Ultrasound examination of the chest wall soft tissues was performed in the area of clinical concern. COMPARISON:  02/27/2019 FINDINGS: Ultrasound of the posterior chest performed with the patient upright. No significant right pleural effusion. Small non loculated left pleural effusion. IMPRESSION: Small non loculated left pleural effusion. Recommend repeat chest x-ray tomorrow and if further progression reconsider thoracentesis. Electronically Signed   By: Jerilynn Mages.  Shick M.D.   On: 02/28/2019 16:53   Vas [place] Lower Extremity Venous (dvt) (mc And Wl 7a-7p)  Result Date: 02/26/2019  Lower Venous Study Indications: Edema.  Risk Factors: CHF. Limitations: Poor ultrasound/tissue interface. Comparison Study: No recent studies. Performing Technologist: Oliver Hum RVT  Examination Guidelines: A complete evaluation includes B-mode imaging,  spectral Doppler, color Doppler, and power Doppler as needed of all accessible portions of each vessel. Bilateral testing is considered an integral part of a complete examination. Limited examinations for reoccurring indications may be performed as noted.  +-----+---------------+---------+-----------+----------+-------+  RIGHT Compressibility Phasicity Spontaneity Properties Summary  +-----+---------------+---------+-----------+----------+-------+  CFV   Full            Yes       Yes                             +-----+---------------+---------+-----------+----------+-------+   +---------+---------------+---------+-----------+----------+-------+  LEFT      Compressibility Phasicity Spontaneity Properties Summary  +---------+---------------+---------+-----------+----------+-------+  CFV       Full            Yes       Yes                             +---------+---------------+---------+-----------+----------+-------+  SFJ       Full                                                      +---------+---------------+---------+-----------+----------+-------+  FV Prox   Full                                                      +---------+---------------+---------+-----------+----------+-------+  FV Mid    Full                                                      +---------+---------------+---------+-----------+----------+-------+  FV Distal Full                                                      +---------+---------------+---------+-----------+----------+-------+  PFV       Full                                                      +---------+---------------+---------+-----------+----------+-------+  POP       Full            Yes       Yes                             +---------+---------------+---------+-----------+----------+-------+  PTV       Full                                                      +---------+---------------+---------+-----------+----------+-------+  PERO      Full                                                       +---------+---------------+---------+-----------+----------+-------+     Summary: Right: No evidence of common femoral vein obstruction. Left: There is no evidence of deep vein thrombosis in the lower extremity. No cystic structure found in the popliteal fossa.  *See table(s) above for measurements and observations.  Electronically signed by Harold Barban MD on 02/26/2019 at 50:19:35 PM.    Final    Vas [place] Upper Ext Vein Mapping (pre-op Avf)  Result Date: 02/28/2019 UPPER EXTREMITY VEIN MAPPING  Indications: Pre-access. Performing Technologist: Oliver Hum RVT  Examination Guidelines: A complete evaluation includes B-mode imaging, spectral Doppler, color Doppler, and power Doppler as needed of all accessible portions of each vessel. Bilateral testing is considered an integral part of a complete examination. Limited examinations for reoccurring indications may be performed as noted. +-----------------+-------------+----------+----------------------------------+  Right Cephalic    Diameter (cm) Depth (cm)              Findings               +-----------------+-------------+----------+----------------------------------+  Shoulder              0.41         1.15                                        +-----------------+-------------+----------+----------------------------------+  Prox upper arm        0.34         1.15                                        +-----------------+-------------+----------+----------------------------------+  Mid upper arm         0.38         1.03                branching               +-----------------+-------------+----------+----------------------------------+  Dist upper arm        0.37         0.74                                        +-----------------+-------------+----------+----------------------------------+  Antecubital fossa     0.44         0.61                                         +-----------------+-------------+----------+----------------------------------+  Prox forearm                               not visualized and IV and bandages  +-----------------+-------------+----------+----------------------------------+  Mid forearm           0.18         0.43                                        +-----------------+-------------+----------+----------------------------------+  Dist forearm          0.26         0.20                branching               +-----------------+-------------+----------+----------------------------------+ +-----------------+-------------+----------+---------+  Right Basilic     Diameter (cm) Depth (cm) Findings   +-----------------+-------------+----------+---------+  Shoulder              0.46         0.88               +-----------------+-------------+----------+---------+  Mid upper arm         0.46         1.02               +-----------------+-------------+----------+---------+  Dist upper arm        0.23         1.08    branching  +-----------------+-------------+----------+---------+  Antecubital fossa     0.18         0.31               +-----------------+-------------+----------+---------+  Prox forearm          0.22         0.53               +-----------------+-------------+----------+---------+  Mid forearm           0.19         0.24               +-----------------+-------------+----------+---------+  Distal forearm        0.19         0.23               +-----------------+-------------+----------+---------+ +-----------------+-------------+----------+---------+  Left Cephalic     Diameter (cm) Depth (cm) Findings   +-----------------+-------------+----------+---------+  Shoulder              0.46         1.07               +-----------------+-------------+----------+---------+  Prox upper arm        0.22         0.95               +-----------------+-------------+----------+---------+  Mid upper arm         0.14         0.85    branching   +-----------------+-------------+----------+---------+  Dist upper arm        0.13         0.76               +-----------------+-------------+----------+---------+  Antecubital fossa     0.15         0.54               +-----------------+-------------+----------+---------+  Prox forearm          0.12         0.34    branching  +-----------------+-------------+----------+---------+  Mid forearm           0.13         0.25               +-----------------+-------------+----------+---------+  Dist forearm          0.08         0.25               +-----------------+-------------+----------+---------+ +-----------------+-------------+----------+---------+  Left Basilic      Diameter (cm) Depth (cm) Findings   +-----------------+-------------+----------+---------+  Shoulder              0.24         1.00               +-----------------+-------------+----------+---------+  Mid upper arm  0.24         0.90               +-----------------+-------------+----------+---------+  Dist upper arm        0.20         0.70               +-----------------+-------------+----------+---------+  Antecubital fossa     0.12         0.64    branching  +-----------------+-------------+----------+---------+  Prox forearm          0.15         0.55               +-----------------+-------------+----------+---------+  Mid forearm           0.16         0.16               +-----------------+-------------+----------+---------+  Distal forearm        0.12         0.21               +-----------------+-------------+----------+---------+ *See table(s) above for measurements and observations.   Diagnosing physician: Servando Snare MD Electronically signed by Servando Snare MD on 02/28/2019 at 5:30:34 PM.    Final      Cardiac Studies   None this admit  ECHO: August 03, 2018 - Left ventricle: The cavity size was normal. There was mild focal   basal hypertrophy of the septum. Systolic function was mildly   reduced. The estimated ejection  fraction was in the range of 45%   to 50%. Hypokinesis of the apicalinferolateral, inferior,   inferoseptal, and apical myocardium. - Aortic valve: Trileaflet; mildly thickened, mildly calcified   leaflets. Left coronary cusp mobility was moderately restricted. - Mitral valve: Calcified annulus. Mildly thickened leaflets .   There was trivial regurgitation. - Right ventricle: The cavity size was mildly dilated. Wall   thickness was normal. Systolic function was low normal. - Atrial septum: No defect or patent foramen ovale was identified. - Tricuspid valve: There was no significant regurgitation. - Pulmonic valve: There was trivial regurgitation.  Impressions:  - EF using wall tracking approximately 45%. Hypokinesis of distal   inferior, inferolateral, inferoseptal, and apical walls. Wall   motion visually appears similar to prior. Thickened LCC of aortic   valve appears similar to prior.  Patient Profile     68 y.o. female w/ hx chronic systolic and diastolic heart failure, severe CAD, not a candidate for CABG, s/p R AKA, hypertension, DM, NSTEMI x 4, last in 04/2018, who was admitted 06/09 with CP, CHF, acute on chronic renal failure, cards saw 06/10.  Assessment & Plan    1. Acute on chronic combined CHF:  - S/P thoracentesis of 1.2 L on 6/10, Nephrology seeing, wants to continue lasix for now with oliguria, expecting HD access today followed by HD. - improved LE edema, but still severe pitting   2. Pleural effusions:  - s/p thoracentesis 06/10 with 1.2 L out on R - discuss L thoracentesis w/ MD - could just wait and see if effusions improve w/ HD  3. CAD:  - no ischemic sx - on high-dose statin, low-dose BB & Imdur - BP too low to increase anything - no ischemic sx now. - Medical therapy for CAD (ASA, statin, imdur), elevated troponin sec to CHF, acute on chronic kidney failure.  Ena Dawley, MD 03/01/2019

## 2019-03-01 NOTE — Progress Notes (Signed)
  Progress Note    03/01/2019 8:18 AM   Subjective: No overnight complaints  Vitals:   03/01/19 0347 03/01/19 0600  BP: 118/60   Pulse: 91   Resp: 20   Temp: 97.8 F (36.6 C)   SpO2: (!) 88% 99%    Physical Exam: She is awake and alert Nonlabored respirations Palpable bilateral radial pulses IV in her right AC fossa  CBC    Component Value Date/Time   WBC 7.3 02/26/2019 2212   RBC 2.95 (L) 02/26/2019 2212   HGB 7.9 (L) 02/26/2019 2212   HCT 26.1 (L) 02/26/2019 2212   HCT 26.8 (L) 02/26/2019 2212   PLT 223 02/26/2019 2212   MCV 88.5 02/26/2019 2212   MCH 26.8 02/26/2019 2212   MCHC 30.3 02/26/2019 2212   RDW 14.2 02/26/2019 2212   LYMPHSABS 1.7 02/26/2019 1445   MONOABS 0.7 02/26/2019 1445   EOSABS 0.1 02/26/2019 1445   BASOSABS 0.0 02/26/2019 1445    BMET    Component Value Date/Time   NA 140 03/01/2019 0612   K 4.3 03/01/2019 0612   CL 107 03/01/2019 0612   CO2 24 03/01/2019 0612   GLUCOSE 146 (H) 03/01/2019 0612   BUN 74 (H) 03/01/2019 0612   CREATININE 5.81 (H) 03/01/2019 0612   CALCIUM 8.3 (L) 03/01/2019 0612   GFRNONAA 7 (L) 03/01/2019 0612   GFRAA 8 (L) 03/01/2019 0612    INR    Component Value Date/Time   INR 0.98 09/14/2018 2105     Intake/Output Summary (Last 24 hours) at 03/01/2019 0818 Last data filed at 03/01/2019 0813 Gross per 24 hour  Intake 1120 ml  Output 1110 ml  Net 10 ml     Assessment/plan:  68 y.o. female is here with new diagnosis end-stage renal disease.  We will plan tunneled dialysis catheter and right upper extremity access either fistula versus graft on Monday.  We need to restrict her right upper extremity which will require switching her IV to her left arm prior to Monday.  N.p.o. past midnight Sunday.  I discussed the risk and benefits as well as alternatives to proceeding with access surgery.  Patient does demonstrate very limited understanding but is agreeable to proceed.   Kadeidra Coryell C. Donzetta Matters, MD [facility] Office: [phone] Pager: [phone]  03/01/2019 8:18 AM

## 2019-03-02 LAB — RENAL FUNCTION PANEL
Albumin: 2.8 g/dL — ABNORMAL LOW (ref 3.5–5.0)
Albumin: 2.6 g/dL — ABNORMAL LOW (ref 3.5–5.0)
Anion gap: 12 (ref 5–15)
Anion gap: 10 (ref 5–15)
BUN: 43 mg/dL — ABNORMAL HIGH (ref 8–23)
BUN: 47 mg/dL — ABNORMAL HIGH (ref 8–23)
CO2: 24 mmol/L (ref 22–32)
CO2: 27 mmol/L (ref 22–32)
Calcium: 8.2 mg/dL — ABNORMAL LOW (ref 8.9–10.3)
Calcium: 8.2 mg/dL — ABNORMAL LOW (ref 8.9–10.3)
Chloride: 103 mmol/L (ref 98–111)
Chloride: 104 mmol/L (ref 98–111)
Creatinine, Ser: 3.81 mg/dL — ABNORMAL HIGH (ref 0.44–1.00)
Creatinine, Ser: 4.24 mg/dL — ABNORMAL HIGH (ref 0.44–1.00)
GFR calc Af Amer: 14 mL/min — ABNORMAL LOW (ref >60)
GFR calc Af Amer: 12 mL/min — ABNORMAL LOW (ref >60)
GFR calc non Af Amer: 12 mL/min — ABNORMAL LOW (ref >60)
GFR calc non Af Amer: 10 mL/min — ABNORMAL LOW (ref >60)
Glucose, Bld: 144 mg/dL — ABNORMAL HIGH (ref 70–99)
Glucose, Bld: 127 mg/dL — ABNORMAL HIGH (ref 70–99)
Phosphorus: 3 mg/dL (ref 2.5–4.6)
Phosphorus: 4.5 mg/dL (ref 2.5–4.6)
Potassium: 3.6 mmol/L (ref 3.5–5.1)
Potassium: 4 mmol/L (ref 3.5–5.1)
Sodium: 139 mmol/L (ref 135–145)
Sodium: 141 mmol/L (ref 135–145)

## 2019-03-02 LAB — GLUCOSE, CAPILLARY
Glucose-Capillary: 112 mg/dL — ABNORMAL HIGH (ref 70–99)
Glucose-Capillary: 167 mg/dL — ABNORMAL HIGH (ref 70–99)
Glucose-Capillary: 76 mg/dL (ref 70–99)
Glucose-Capillary: 212 mg/dL — ABNORMAL HIGH (ref 70–99)

## 2019-03-02 MED ORDER — HEPARIN SODIUM (PORCINE) 1000 UNIT/ML IJ SOLN
INTRAMUSCULAR | Status: AC
  Administered 2019-03-02: 3200 [IU] via INTRAVENOUS_CENTRAL
  Filled 2019-03-02: qty 4

## 2019-03-02 MED ORDER — ISOSORBIDE MONONITRATE ER 30 MG PO TB24
15.0000 mg | ORAL_TABLET | Freq: Two times a day (BID) | ORAL | Status: DC
  Administered 2019-03-04 – 2019-03-05 (×2): 15 mg via ORAL
  Filled 2019-03-02 (×6): qty 1

## 2019-03-02 MED ORDER — HYDRALAZINE HCL 10 MG PO TABS
10.0000 mg | ORAL_TABLET | Freq: Three times a day (TID) | ORAL | Status: DC
  Administered 2019-03-02 – 2019-03-05 (×4): 10 mg via ORAL
  Filled 2019-03-02 (×7): qty 1

## 2019-03-02 NOTE — Progress Notes (Signed)
[facility] Progress Note    Assessment/ Plan:   67y/o woman w/PMH significant for multivessel CAD (inoperable),CHF(EF 45-50%), IDDM, HTN, HLD, PVD s/p RAKA, CVA, and CKD stage 4 (follows Dr. Joesph July in [facility]) who presented to [facility] ED with a 3 day history of worsening SOB and lower extremity edema of her left leg s/p rt thoracentesis 6/10 (1226ml).  1. AKI/CKD stage 4 vs progressive CKD (no Cr levels for the past 6 months available for review) in setting of acute on chronic combined diastolic and systolic CHF. Agree with IV lasix and follow renal function. 1. I havecontact Dr. Janann August office6/11 for baseline Cr levels and she was already in the 4's 1st week of June and close to having to start dialysis.Pt had been putting off having to make a decision. 2. Appreciate Dr. Donzetta Matters seeing her for access placement. Vein mapping performed. Access surgery on Monday. 3. Appreciate Dr. [name] VIR RIJ TC placement 6/12 to allow HD 6/12 which pt tolerated. 4. Will also HD #2 today.  2. Acute hypoxic respiratory distress- due to CHF. Continue with O2 via [place] and diuresis. 3. Acute on chronic combined diastolic and systolic CHF- as above. CXR with pleural effusions. Follow after IV lasix; not a great response. Currently d/c'd 4. Anemia of CKD-iron deficient -> will load with Feraheme6/11 (received)andstarted ESAwith dialysis (Aranesp 100 IV 6/12) . 5. CAD- currently stable, no CP 6. DM- per primary svc 7. HLD- cont with lipitor  Subjective:   Still dyspneic but improved after rt thoracentesis and HD. No fever, chills, nausea.  Tolerated PO's this AM.   Objective:   BP (!) 111/59 (BP Location: Left Arm)   Pulse 86   Temp 98 F (36.7 C) (Oral)   Resp 18   Ht 5\' 4"  (1.626 m)   Wt 75.8 kg Comment: scale a  SpO2 100%   BMI 28.70 kg/m   Intake/Output Summary (Last 24 hours) at 03/02/2019 3846 Last data filed at 03/02/2019 0300 Gross per 24 hour  Intake 66 ml   Output 1700 ml  Net -1634 ml   Weight change: 3.001 kg  Physical Exam: GEN: NAD, A&Ox3, NCAT HEENT: EOMI, +pallor NECK: Supple,+JVD LUNGS:very poor air movement CV: RRR ABD: SNDNT +BS  KZL:DJTTS AKA w/ edema, 2+left lower extremity edema ACCESS: RIJ TC  Imaging: Ir Fluoro Guide Cv Line Right  Result Date: 03/01/2019 INDICATION: END-STAGE RENAL DISEASE EXAM: ULTRASOUND GUIDANCE FOR VASCULAR ACCESS RIGHT INTERNAL JUGULAR PERMANENT HEMODIALYSIS CATHETER Date:  03/01/2019 03/01/2019 2:11 pm Radiologist:  Jerilynn Mages. Daryll Brod, MD Guidance:  Ultrasound fluoroscopic FLUOROSCOPY TIME:  Fluoroscopy Time: 0 minutes 54 seconds (5 mGy). MEDICATIONS: Ancef 2 g within 1 hour of the procedure ANESTHESIA/SEDATION: Versed 1 mg IV; Fentanyl 25 mcg IV; Moderate Sedation Time:  15 minutes The patient was continuously monitored during the procedure by the interventional radiology nurse under my direct supervision. CONTRAST:  None. COMPLICATIONS: None immediate. PROCEDURE: Informed consent was obtained from the patient following explanation of the procedure, risks, benefits and alternatives. The patient understands, agrees and consents for the procedure. All questions were addressed. A time out was performed. Maximal barrier sterile technique utilized including caps, mask, sterile gowns, sterile gloves, large sterile drape, hand hygiene, and 2% chlorhexidine scrub. Under sterile conditions and local anesthesia, right internal jugular micropuncture venous access was performed with ultrasound. Images were obtained for documentation of the patent right internal jugular vein. A guide wire was inserted followed by a transitional dilator. Next, a 0.035 guidewire was advanced  into the IVC with a 5-French catheter. Measurements were obtained from the right venotomy site to the proximal right atrium. In the right infraclavicular chest, a subcutaneous tunnel was created under sterile conditions and local anesthesia. 1% lidocaine  with epinephrine was utilized for this. The 19 cm tip to cuff dialysis catheter was tunneled subcutaneously to the venotomy site and inserted into the SVC/RA junction through a valved peel-away sheath. Position was confirmed with fluoroscopy. Images were obtained for documentation. Blood was aspirated from the catheter followed by saline and heparin flushes. The appropriate volume and strength of heparin was instilled in each lumen. Caps were applied. The catheter was secured at the tunnel site with Gelfoam and a pursestring suture. The venotomy site was closed with subcuticular Vicryl suture. Dermabond was applied to the small right neck incision. A dry sterile dressing was applied. The catheter is ready for use. No immediate complications. IMPRESSION: Ultrasound and fluoroscopically guided right internal jugular tunneled hemodialysis catheter (19 cm tip to cuff dialysis catheter). Electronically Signed   By: Jerilynn Mages.  Shick M.D.   On: 03/01/2019 14:20   Ir US Guide Vasc Access Right  Result Date: 03/01/2019 INDICATION: END-STAGE RENAL DISEASE EXAM: ULTRASOUND GUIDANCE FOR VASCULAR ACCESS RIGHT INTERNAL JUGULAR PERMANENT HEMODIALYSIS CATHETER Date:  03/01/2019 03/01/2019 2:11 pm Radiologist:  Jerilynn Mages. Daryll Brod, MD Guidance:  Ultrasound fluoroscopic FLUOROSCOPY TIME:  Fluoroscopy Time: 0 minutes 54 seconds (5 mGy). MEDICATIONS: Ancef 2 g within 1 hour of the procedure ANESTHESIA/SEDATION: Versed 1 mg IV; Fentanyl 25 mcg IV; Moderate Sedation Time:  15 minutes The patient was continuously monitored during the procedure by the interventional radiology nurse under my direct supervision. CONTRAST:  None. COMPLICATIONS: None immediate. PROCEDURE: Informed consent was obtained from the patient following explanation of the procedure, risks, benefits and alternatives. The patient understands, agrees and consents for the procedure. All questions were addressed. A time out was performed. Maximal barrier sterile technique utilized  including caps, mask, sterile gowns, sterile gloves, large sterile drape, hand hygiene, and 2% chlorhexidine scrub. Under sterile conditions and local anesthesia, right internal jugular micropuncture venous access was performed with ultrasound. Images were obtained for documentation of the patent right internal jugular vein. A guide wire was inserted followed by a transitional dilator. Next, a 0.035 guidewire was advanced into the IVC with a 5-French catheter. Measurements were obtained from the right venotomy site to the proximal right atrium. In the right infraclavicular chest, a subcutaneous tunnel was created under sterile conditions and local anesthesia. 1% lidocaine with epinephrine was utilized for this. The 19 cm tip to cuff dialysis catheter was tunneled subcutaneously to the venotomy site and inserted into the SVC/RA junction through a valved peel-away sheath. Position was confirmed with fluoroscopy. Images were obtained for documentation. Blood was aspirated from the catheter followed by saline and heparin flushes. The appropriate volume and strength of heparin was instilled in each lumen. Caps were applied. The catheter was secured at the tunnel site with Gelfoam and a pursestring suture. The venotomy site was closed with subcuticular Vicryl suture. Dermabond was applied to the small right neck incision. A dry sterile dressing was applied. The catheter is ready for use. No immediate complications. IMPRESSION: Ultrasound and fluoroscopically guided right internal jugular tunneled hemodialysis catheter (19 cm tip to cuff dialysis catheter). Electronically Signed   By: Jerilynn Mages.  Shick M.D.   On: 03/01/2019 14:20   Ir US Chest  Result Date: 02/28/2019 CLINICAL DATA:  Assess for pleural effusions EXAM: ULTRASOUND OF CHEST SOFT TISSUES TECHNIQUE:  Ultrasound examination of the chest wall soft tissues was performed in the area of clinical concern. COMPARISON:  02/27/2019 FINDINGS: Ultrasound of the posterior chest  performed with the patient upright. No significant right pleural effusion. Small non loculated left pleural effusion. IMPRESSION: Small non loculated left pleural effusion. Recommend repeat chest x-ray tomorrow and if further progression reconsider thoracentesis. Electronically Signed   By: Jerilynn Mages.  Shick M.D.   On: 02/28/2019 16:53   Vas [place] Upper Ext Vein Mapping (pre-op Avf)  Result Date: 02/28/2019 UPPER EXTREMITY VEIN MAPPING  Indications: Pre-access. Performing Technologist: Oliver Hum RVT  Examination Guidelines: A complete evaluation includes B-mode imaging, spectral Doppler, color Doppler, and power Doppler as needed of all accessible portions of each vessel. Bilateral testing is considered an integral part of a complete examination. Limited examinations for reoccurring indications may be performed as noted. +-----------------+-------------+----------+----------------------------------+ Right Cephalic   Diameter (cm)Depth (cm)             Findings              +-----------------+-------------+----------+----------------------------------+ Shoulder             0.41        1.15                                      +-----------------+-------------+----------+----------------------------------+ Prox upper arm       0.34        1.15                                      +-----------------+-------------+----------+----------------------------------+ Mid upper arm        0.38        1.03               branching              +-----------------+-------------+----------+----------------------------------+ Dist upper arm       0.37        0.74                                      +-----------------+-------------+----------+----------------------------------+ Antecubital fossa    0.44        0.61                                      +-----------------+-------------+----------+----------------------------------+ Prox forearm                            not visualized and IV and  bandages +-----------------+-------------+----------+----------------------------------+ Mid forearm          0.18        0.43                                      +-----------------+-------------+----------+----------------------------------+ Dist forearm         0.26        0.20               branching              +-----------------+-------------+----------+----------------------------------+ +-----------------+-------------+----------+---------+ Right Basilic  Diameter (cm)Depth (cm)Findings  +-----------------+-------------+----------+---------+ Shoulder             0.46        0.88             +-----------------+-------------+----------+---------+ Mid upper arm        0.46        1.02             +-----------------+-------------+----------+---------+ Dist upper arm       0.23        1.08   branching +-----------------+-------------+----------+---------+ Antecubital fossa    0.18        0.31             +-----------------+-------------+----------+---------+ Prox forearm         0.22        0.53             +-----------------+-------------+----------+---------+ Mid forearm          0.19        0.24             +-----------------+-------------+----------+---------+ Distal forearm       0.19        0.23             +-----------------+-------------+----------+---------+ +-----------------+-------------+----------+---------+ Left Cephalic    Diameter (cm)Depth (cm)Findings  +-----------------+-------------+----------+---------+ Shoulder             0.46        1.07             +-----------------+-------------+----------+---------+ Prox upper arm       0.22        0.95             +-----------------+-------------+----------+---------+ Mid upper arm        0.14        0.85   branching +-----------------+-------------+----------+---------+ Dist upper arm       0.13        0.76             +-----------------+-------------+----------+---------+  Antecubital fossa    0.15        0.54             +-----------------+-------------+----------+---------+ Prox forearm         0.12        0.34   branching +-----------------+-------------+----------+---------+ Mid forearm          0.13        0.25             +-----------------+-------------+----------+---------+ Dist forearm         0.08        0.25             +-----------------+-------------+----------+---------+ +-----------------+-------------+----------+---------+ Left Basilic     Diameter (cm)Depth (cm)Findings  +-----------------+-------------+----------+---------+ Shoulder             0.24        1.00             +-----------------+-------------+----------+---------+ Mid upper arm        0.24        0.90             +-----------------+-------------+----------+---------+ Dist upper arm       0.20        0.70             +-----------------+-------------+----------+---------+ Antecubital fossa    0.12        0.64   branching +-----------------+-------------+----------+---------+ Prox forearm  0.15        0.55             +-----------------+-------------+----------+---------+ Mid forearm          0.16        0.16             +-----------------+-------------+----------+---------+ Distal forearm       0.12        0.21             +-----------------+-------------+----------+---------+ *See table(s) above for measurements and observations.   Diagnosing physician: Servando Snare MD Electronically signed by Servando Snare MD on 02/28/2019 at 5:30:34 PM.    Final     Labs: BMET Recent Labs  Lab 02/26/19 1445 02/26/19 2212 02/27/19 0325 02/28/19 0556 03/01/19 0612 03/01/19 1813 03/02/19 0503  NA 143  --  142 143 140 139 141  K 4.3  --  4.8 4.3 4.3 3.6 4.0  CL 111  --  110 108 107 103 104  CO2 20*  --  23 22 24 24 27   GLUCOSE 226*  --  190* 81 146* 144* 127*  BUN 66*  --  67* 70* 74* 43* 47*  CREATININE 5.00* 5.03* 5.03* 5.56* 5.81* 3.81*  4.24*  CALCIUM 9.0  --  8.5* 8.7* 8.3* 8.2* 8.2*  PHOS  --   --  4.9* 4.5 4.6 3.0 4.5   CBC Recent Labs  Lab 02/26/19 1445 02/26/19 2212 03/01/19 1813  WBC 8.3 7.3 7.3  NEUTROABS 5.8  --   --   HGB 8.1* 7.9* 7.6*  HCT 27.3* 26.1*  26.8* 25.2*  MCV 88.3 88.5 87.8  PLT 234 223 154    Medications:    . aspirin  81 mg Oral Daily  . atorvastatin  80 mg Oral Daily  . carvedilol  3.125 mg Oral BID WC  . Chlorhexidine Gluconate Cloth  6 each Topical Q0600  . cholecalciferol  4,000 Units Oral Daily  . hydrALAZINE  25 mg Oral Q8H  . insulin aspart  0-5 Units Subcutaneous QHS  . insulin aspart  0-9 Units Subcutaneous TID WC  . insulin glargine  20 Units Subcutaneous QHS  . isosorbide mononitrate  30 mg Oral TID  . sodium chloride flush  3 mL Intravenous Q12H      Otelia Santee, MD 03/02/2019, 8:24 AM

## 2019-03-02 NOTE — Progress Notes (Signed)
    Dr Nelson's rounding note reviewed. Paitent being managed for acute on chronic systolic/diastolic HF. Diuretics per nephrology with preperations for possible HD. Medical therapy limited by soft bp's and renal dysfunction. No additional cardiology recs over the weekend, call with questions    Carlyle Dolly  For questions or updates, please contact [facility] Please consult [email or web address] for contact info under        Signed, Carlyle Dolly, MD  03/02/2019, 10:44 AM

## 2019-03-02 NOTE — Progress Notes (Signed)
PROGRESS NOTE    Jenna Brown   DQQ:[id number]  DOB: October 24, 1950  DOA: 02/26/2019 PCP: [name], MD   Brief Narrative:  Jenna Brown  is a 68 y.o. female with medical history significant of coronary artery disease,  congestive heart failure with an ejection fraction of 45 to 50% in November 2019 with associated ischemic cardiomyopathy and hypokinesis, prior stroke, anemia, anxiety, right AKA, stage IV chronic kidney disease, gastroesophageal reflux disease, hypertension, hyperlipidemia, and type 2 diabetes who presents emergency department complaining of worsening dyspnea x3 days. In ED, Cr noted to be 5 from 3.90 on 09/16/18 Troponin 2.38 CXR> moderate pleural effusions Admitted for acute CHF and AKI. Nephrology consulted.   Subjective: She has no new complaints. Feels the swelling in her legs is now making her quite uncomfortable.     Assessment & Plan:   Principal Problem:   Acute on chronic systolic and diastolic congestive heart failure ,      Pleural effusion, bilateral   Acute respiratory failure with hypoxia   AKI on CKD 4-  - ECHO 11/19> 45% to 50%. Hypokinesis of the apicalinferolateral, inferior,   inferoseptal, and apical myocardium. - cardiology consulted- they ordered a thoracentesis on 6/10- Right thora > 1. 2 L  - I spoke with Dr Augustin Coupe who has decided to start dialysis- vascular surgery consulted- plan for fistula vs graft on Monday - NPO on Sunday night - tunnelled right IJ placed on 6/12 by IR and dialysis started - remains on Oxygen support - will receive second dialysis today - I will d/c the lasix infusion  Active Problems: Metabolic acidosis - related to above  - on sodium bicarb at home which has been resumed  CAD s/p CABG - she stopped her Brillinta about 1 mo ago due to dyspnea - con ASA, Lipitor, Coreg, Imdur, Hydralazine- due to hypotension, will cut back on Hydralazine to 10 TID and Imdur to 15 BID    Anxiety -cont Xanax PRN   Anemia, likely of chronic disease  - anemia panel reviewed    Insulin-requiring or dependent type II diabetes mellitus  -cont Lantus - start SSI  Right AKA   Time spent in minutes: 35 DVT prophylaxis:  Heparin Code Status: Full code Family Communication:  Disposition Plan: home after vascular surgery for graft/fistula on Monday Consultants:   Nephro  Cardiology  Vascular surgery  IR Procedures:   none Antimicrobials:  Anti-infectives (From admission, onward)   Start     Dose/Rate Route Frequency Ordered Stop   03/01/19 1345  ceFAZolin (ANCEF) IVPB 2g/100 mL premix     2 g 200 mL/hr over 30 Minutes Intravenous  Once 03/01/19 1331 03/01/19 1409       Objective: Vitals:   03/02/19 1210 03/02/19 1219 03/02/19 1230 03/02/19 1300  BP: 97/63 (!) 97/50 101/60 108/66  Pulse: 79 78 76 80  Resp: 20 20 13 19   Temp: 98.1 F (36.7 C)     TempSrc: Oral     SpO2: 100%     Weight: 76.7 kg     Height:        Intake/Output Summary (Last 24 hours) at 03/02/2019 1429 Last data filed at 03/02/2019 0900 Gross per 24 hour  Intake 306 ml  Output 1500 ml  Net -1194 ml   Filed Weights   03/01/19 1635 03/02/19 0429 03/02/19 1210  Weight: 76.2 kg 75.8 kg 76.7 kg    Examination: General exam: Appears comfortable  HEENT: PERRLA, oral mucosa moist, no  sclera icterus or thrush Respiratory system: Crackles at bases. Respiratory effort normal. Cardiovascular system: S1 & S2 heard,  No murmurs  Gastrointestinal system: Abdomen soft, non-tender, nondistended. Normal bowel sounds   Central nervous system: Alert and oriented. No focal neurological deficits. Extremities: No cyanosis, clubbing or edema- right AKA- edema of b/l LE Skin: No rashes or ulcers Psychiatry:  Mood & affect appropriate.     Data Reviewed: I have personally reviewed following labs and imaging studies  CBC: Recent Labs  Lab 02/26/19 1445 02/26/19 2212 03/01/19 1813  WBC 8.3 7.3 7.3  NEUTROABS 5.8  --    --   HGB 8.1* 7.9* 7.6*  HCT 27.3* 26.1*   26.8* 25.2*  MCV 88.3 88.5 87.8  PLT 234 223 761   Basic Metabolic Panel: Recent Labs  Lab 02/27/19 0325 02/28/19 0556 03/01/19 0612 03/01/19 1813 03/02/19 0503  NA 142 143 140 139 141  K 4.8 4.3 4.3 3.6 4.0  CL 110 108 107 103 104  CO2 23 22 24 24 27   GLUCOSE 190* 81 146* 144* 127*  BUN 67* 70* 74* 43* 47*  CREATININE 5.03* 5.56* 5.81* 3.81* 4.24*  CALCIUM 8.5* 8.7* 8.3* 8.2* 8.2*  PHOS 4.9* 4.5 4.6 3.0 4.5   GFR: Estimated Creatinine Clearance: 13.3 mL/min (A) (by C-G formula based on SCr of 4.24 mg/dL (H)). Liver Function Tests: Recent Labs  Lab 02/26/19 1445 02/27/19 0325 02/28/19 0556 03/01/19 0612 03/01/19 1813 03/02/19 0503  AST 25  --   --   --   --   --   ALT 20  --   --   --   --   --   ALKPHOS 103  --   --   --   --   --   BILITOT 1.1  --   --   --   --   --   PROT 6.8  --   --   --   --   --   ALBUMIN 3.4* 3.0* 2.9* 2.8* 2.8* 2.6*   No results for input(s): LIPASE, AMYLASE in the last 168 hours. No results for input(s): AMMONIA in the last 168 hours. Coagulation Profile: Recent Labs  Lab 03/01/19 1150  INR 1.2   Cardiac Enzymes: Recent Labs  Lab 02/26/19 1445  TROPONINI 0.16*   BNP (last 3 results) No results for input(s): PROBNP in the last 8760 hours. HbA1C: No results for input(s): HGBA1C in the last 72 hours. CBG: Recent Labs  Lab 03/01/19 1123 03/01/19 1709 03/01/19 2108 03/02/19 0603 03/02/19 1146  GLUCAP 110* 70 198* 112* 167*   Lipid Profile: No results for input(s): CHOL, HDL, LDLCALC, TRIG, CHOLHDL, LDLDIRECT in the last 72 hours. Thyroid Function Tests: No results for input(s): TSH, T4TOTAL, FREET4, T3FREE, THYROIDAB in the last 72 hours. Anemia Panel: No results for input(s): VITAMINB12, FOLATE, FERRITIN, TIBC, IRON, RETICCTPCT in the last 72 hours. Urine analysis:    Component Value Date/Time   COLORURINE YELLOW 09/14/2018 2330   APPEARANCEUR CLOUDY (A) 09/14/2018 2330     LABSPEC 1.014 09/14/2018 2330   PHURINE 7.0 09/14/2018 2330   GLUCOSEU 50 (A) 09/14/2018 2330   HGBUR NEGATIVE 09/14/2018 2330   BILIRUBINUR NEGATIVE 09/14/2018 2330   KETONESUR 5 (A) 09/14/2018 2330   PROTEINUR 100 (A) 09/14/2018 2330   UROBILINOGEN 0.2 04/10/2015 1248   NITRITE NEGATIVE 09/14/2018 2330   LEUKOCYTESUR TRACE (A) 09/14/2018 2330   Sepsis Labs: @LABRCNTIP (procalcitonin:4,lacticidven:4) ) Recent Results (from the past 240 hour(s))  Novel  Coronavirus, NAA (hospital order; send-out to ref lab)     Status: None   Collection Time: 02/26/19  5:38 PM   Specimen: Nasopharyngeal Swab; Respiratory  Result Value Ref Range Status   SARS-CoV-2, NAA NOT DETECTED NOT DETECTED Final    Comment: (NOTE) This test was developed and its performance characteristics determined by [employer]. This test has not been FDA cleared or approved. This test has been authorized by FDA under an Emergency Use Authorization (EUA). This test is only authorized for the duration of time the declaration that circumstances exist justifying the authorization of the emergency use of in vitro diagnostic tests for detection of SARS-CoV-2 virus and/or diagnosis of COVID-19 infection under section 564(b)(1) of the Act, 21 U.S.C. 578ION-6(E)(9), unless the authorization is terminated or revoked sooner. When diagnostic testing is negative, the possibility of a false negative result should be considered in the context of a patient's recent exposures and the presence of clinical signs and symptoms consistent with COVID-19. An individual without symptoms of COVID-19 and who is not shedding SARS-CoV-2 virus would expect to have a negative (not detected) result in this assay. Performed  At: [facility] [address] [place], [place] [id number] Rush Farmer MD WN:[id number]    [place]  Final    Comment: Performed at [facility], [place] [address]  [place], [place] [address]         Radiology Studies: Ir Fluoro Guide Cv Line Right  Result Date: 03/01/2019 INDICATION: END-STAGE RENAL DISEASE EXAM: ULTRASOUND GUIDANCE FOR VASCULAR ACCESS RIGHT INTERNAL JUGULAR PERMANENT HEMODIALYSIS CATHETER Date:  03/01/2019 03/01/2019 2:11 pm Radiologist:  Jerilynn Mages. Daryll Brod, MD Guidance:  Ultrasound fluoroscopic FLUOROSCOPY TIME:  Fluoroscopy Time: 0 minutes 54 seconds (5 mGy). MEDICATIONS: Ancef 2 g within 1 hour of the procedure ANESTHESIA/SEDATION: Versed 1 mg IV; Fentanyl 25 mcg IV; Moderate Sedation Time:  15 minutes The patient was continuously monitored during the procedure by the interventional radiology nurse under my direct supervision. CONTRAST:  None. COMPLICATIONS: None immediate. PROCEDURE: Informed consent was obtained from the patient following explanation of the procedure, risks, benefits and alternatives. The patient understands, agrees and consents for the procedure. All questions were addressed. A time out was performed. Maximal barrier sterile technique utilized including caps, mask, sterile gowns, sterile gloves, large sterile drape, hand hygiene, and 2% chlorhexidine scrub. Under sterile conditions and local anesthesia, right internal jugular micropuncture venous access was performed with ultrasound. Images were obtained for documentation of the patent right internal jugular vein. A guide wire was inserted followed by a transitional dilator. Next, a 0.035 guidewire was advanced into the IVC with a 5-French catheter. Measurements were obtained from the right venotomy site to the proximal right atrium. In the right infraclavicular chest, a subcutaneous tunnel was created under sterile conditions and local anesthesia. 1% lidocaine with epinephrine was utilized for this. The 19 cm tip to cuff dialysis catheter was tunneled subcutaneously to the venotomy site and inserted into the SVC/RA junction through a valved peel-away sheath. Position was confirmed with  fluoroscopy. Images were obtained for documentation. Blood was aspirated from the catheter followed by saline and heparin flushes. The appropriate volume and strength of heparin was instilled in each lumen. Caps were applied. The catheter was secured at the tunnel site with Gelfoam and a pursestring suture. The venotomy site was closed with subcuticular Vicryl suture. Dermabond was applied to the small right neck incision. A dry sterile dressing was applied. The catheter is ready for use.  No immediate complications. IMPRESSION: Ultrasound and fluoroscopically guided right internal jugular tunneled hemodialysis catheter (19 cm tip to cuff dialysis catheter). Electronically Signed   By: Jerilynn Mages.  Shick M.D.   On: 03/01/2019 14:20   Ir US Guide Vasc Access Right  Result Date: 03/01/2019 INDICATION: END-STAGE RENAL DISEASE EXAM: ULTRASOUND GUIDANCE FOR VASCULAR ACCESS RIGHT INTERNAL JUGULAR PERMANENT HEMODIALYSIS CATHETER Date:  03/01/2019 03/01/2019 2:11 pm Radiologist:  Jerilynn Mages. Daryll Brod, MD Guidance:  Ultrasound fluoroscopic FLUOROSCOPY TIME:  Fluoroscopy Time: 0 minutes 54 seconds (5 mGy). MEDICATIONS: Ancef 2 g within 1 hour of the procedure ANESTHESIA/SEDATION: Versed 1 mg IV; Fentanyl 25 mcg IV; Moderate Sedation Time:  15 minutes The patient was continuously monitored during the procedure by the interventional radiology nurse under my direct supervision. CONTRAST:  None. COMPLICATIONS: None immediate. PROCEDURE: Informed consent was obtained from the patient following explanation of the procedure, risks, benefits and alternatives. The patient understands, agrees and consents for the procedure. All questions were addressed. A time out was performed. Maximal barrier sterile technique utilized including caps, mask, sterile gowns, sterile gloves, large sterile drape, hand hygiene, and 2% chlorhexidine scrub. Under sterile conditions and local anesthesia, right internal jugular micropuncture venous access was performed  with ultrasound. Images were obtained for documentation of the patent right internal jugular vein. A guide wire was inserted followed by a transitional dilator. Next, a 0.035 guidewire was advanced into the IVC with a 5-French catheter. Measurements were obtained from the right venotomy site to the proximal right atrium. In the right infraclavicular chest, a subcutaneous tunnel was created under sterile conditions and local anesthesia. 1% lidocaine with epinephrine was utilized for this. The 19 cm tip to cuff dialysis catheter was tunneled subcutaneously to the venotomy site and inserted into the SVC/RA junction through a valved peel-away sheath. Position was confirmed with fluoroscopy. Images were obtained for documentation. Blood was aspirated from the catheter followed by saline and heparin flushes. The appropriate volume and strength of heparin was instilled in each lumen. Caps were applied. The catheter was secured at the tunnel site with Gelfoam and a pursestring suture. The venotomy site was closed with subcuticular Vicryl suture. Dermabond was applied to the small right neck incision. A dry sterile dressing was applied. The catheter is ready for use. No immediate complications. IMPRESSION: Ultrasound and fluoroscopically guided right internal jugular tunneled hemodialysis catheter (19 cm tip to cuff dialysis catheter). Electronically Signed   By: Jerilynn Mages.  Shick M.D.   On: 03/01/2019 14:20   Ir US Chest  Result Date: 02/28/2019 CLINICAL DATA:  Assess for pleural effusions EXAM: ULTRASOUND OF CHEST SOFT TISSUES TECHNIQUE: Ultrasound examination of the chest wall soft tissues was performed in the area of clinical concern. COMPARISON:  02/27/2019 FINDINGS: Ultrasound of the posterior chest performed with the patient upright. No significant right pleural effusion. Small non loculated left pleural effusion. IMPRESSION: Small non loculated left pleural effusion. Recommend repeat chest x-ray tomorrow and if further  progression reconsider thoracentesis. Electronically Signed   By: Jerilynn Mages.  Shick M.D.   On: 02/28/2019 16:53   Vas [place] Upper Ext Vein Mapping (pre-op Avf)  Result Date: 02/28/2019 UPPER EXTREMITY VEIN MAPPING  Indications: Pre-access. Performing Technologist: Oliver Hum RVT  Examination Guidelines: A complete evaluation includes B-mode imaging, spectral Doppler, color Doppler, and power Doppler as needed of all accessible portions of each vessel. Bilateral testing is considered an integral part of a complete examination. Limited examinations for reoccurring indications may be performed as noted. +-----------------+-------------+----------+----------------------------------+  Right Cephalic  Diameter (cm) Depth (cm)              Findings               +-----------------+-------------+----------+----------------------------------+  Shoulder              0.41         1.15                                        +-----------------+-------------+----------+----------------------------------+  Prox upper arm        0.34         1.15                                        +-----------------+-------------+----------+----------------------------------+  Mid upper arm         0.38         1.03                branching               +-----------------+-------------+----------+----------------------------------+  Dist upper arm        0.37         0.74                                        +-----------------+-------------+----------+----------------------------------+  Antecubital fossa     0.44         0.61                                        +-----------------+-------------+----------+----------------------------------+  Prox forearm                               not visualized and IV and bandages  +-----------------+-------------+----------+----------------------------------+  Mid forearm           0.18         0.43                                         +-----------------+-------------+----------+----------------------------------+  Dist forearm          0.26         0.20                branching               +-----------------+-------------+----------+----------------------------------+ +-----------------+-------------+----------+---------+  Right Basilic     Diameter (cm) Depth (cm) Findings   +-----------------+-------------+----------+---------+  Shoulder              0.46         0.88               +-----------------+-------------+----------+---------+  Mid upper arm         0.46         1.02               +-----------------+-------------+----------+---------+  Dist upper arm        0.23  1.08    branching  +-----------------+-------------+----------+---------+  Antecubital fossa     0.18         0.31               +-----------------+-------------+----------+---------+  Prox forearm          0.22         0.53               +-----------------+-------------+----------+---------+  Mid forearm           0.19         0.24               +-----------------+-------------+----------+---------+  Distal forearm        0.19         0.23               +-----------------+-------------+----------+---------+ +-----------------+-------------+----------+---------+  Left Cephalic     Diameter (cm) Depth (cm) Findings   +-----------------+-------------+----------+---------+  Shoulder              0.46         1.07               +-----------------+-------------+----------+---------+  Prox upper arm        0.22         0.95               +-----------------+-------------+----------+---------+  Mid upper arm         0.14         0.85    branching  +-----------------+-------------+----------+---------+  Dist upper arm        0.13         0.76               +-----------------+-------------+----------+---------+  Antecubital fossa     0.15         0.54               +-----------------+-------------+----------+---------+  Prox forearm          0.12         0.34    branching   +-----------------+-------------+----------+---------+  Mid forearm           0.13         0.25               +-----------------+-------------+----------+---------+  Dist forearm          0.08         0.25               +-----------------+-------------+----------+---------+ +-----------------+-------------+----------+---------+  Left Basilic      Diameter (cm) Depth (cm) Findings   +-----------------+-------------+----------+---------+  Shoulder              0.24         1.00               +-----------------+-------------+----------+---------+  Mid upper arm         0.24         0.90               +-----------------+-------------+----------+---------+  Dist upper arm        0.20         0.70               +-----------------+-------------+----------+---------+  Antecubital fossa     0.12         0.64    branching  +-----------------+-------------+----------+---------+  Prox forearm  0.15         0.55               +-----------------+-------------+----------+---------+  Mid forearm           0.16         0.16               +-----------------+-------------+----------+---------+  Distal forearm        0.12         0.21               +-----------------+-------------+----------+---------+ *See table(s) above for measurements and observations.   Diagnosing physician: Servando Snare MD Electronically signed by Servando Snare MD on 02/28/2019 at 5:30:34 PM.    Final       Scheduled Meds:  aspirin  81 mg Oral Daily   atorvastatin  80 mg Oral Daily   carvedilol  3.125 mg Oral BID WC   Chlorhexidine Gluconate Cloth  6 each Topical Q0600   cholecalciferol  4,000 Units Oral Daily   hydrALAZINE  25 mg Oral Q8H   insulin aspart  0-5 Units Subcutaneous QHS   insulin aspart  0-9 Units Subcutaneous TID WC   insulin glargine  20 Units Subcutaneous QHS   isosorbide mononitrate  30 mg Oral TID   sodium chloride flush  3 mL Intravenous Q12H   Continuous Infusions:  sodium chloride     sodium chloride  Stopped (02/27/19 2224)   sodium chloride     sodium chloride     furosemide 160 mg (03/02/19 1029)     LOS: 4 days      Debbe Odea, MD [facility] Pager: [email or web address] Password [facility] 03/02/2019, 2:29 PM

## 2019-03-03 LAB — RENAL FUNCTION PANEL
Albumin: 2.6 g/dL — ABNORMAL LOW (ref 3.5–5.0)
Anion gap: 8 (ref 5–15)
BUN: 29 mg/dL — ABNORMAL HIGH (ref 8–23)
CO2: 27 mmol/L (ref 22–32)
Calcium: 8.3 mg/dL — ABNORMAL LOW (ref 8.9–10.3)
Chloride: 104 mmol/L (ref 98–111)
Creatinine, Ser: 3.39 mg/dL — ABNORMAL HIGH (ref 0.44–1.00)
GFR calc Af Amer: 16 mL/min — ABNORMAL LOW (ref >60)
GFR calc non Af Amer: 14 mL/min — ABNORMAL LOW (ref >60)
Glucose, Bld: 81 mg/dL (ref 70–99)
Phosphorus: 3 mg/dL (ref 2.5–4.6)
Potassium: 3.3 mmol/L — ABNORMAL LOW (ref 3.5–5.1)
Sodium: 139 mmol/L (ref 135–145)

## 2019-03-03 LAB — GLUCOSE, CAPILLARY
Glucose-Capillary: 72 mg/dL (ref 70–99)
Glucose-Capillary: 102 mg/dL — ABNORMAL HIGH (ref 70–99)
Glucose-Capillary: 139 mg/dL — ABNORMAL HIGH (ref 70–99)
Glucose-Capillary: 155 mg/dL — ABNORMAL HIGH (ref 70–99)

## 2019-03-03 MED ORDER — SODIUM CHLORIDE 0.9 % IV SOLN
1.5000 g | INTRAVENOUS | Status: AC
  Administered 2019-03-04: 1.5 g via INTRAVENOUS
  Filled 2019-03-03: qty 1.5

## 2019-03-03 MED ORDER — POTASSIUM CHLORIDE CRYS ER 20 MEQ PO TBCR
40.0000 meq | EXTENDED_RELEASE_TABLET | Freq: Once | ORAL | Status: DC
  Administered 2019-03-03: 40 meq via ORAL
  Filled 2019-03-03: qty 2

## 2019-03-03 NOTE — Plan of Care (Signed)
  Problem: Education: Goal: Knowledge of General Education information will improve Description Including pain rating scale, medication(s)/side effects and non-pharmacologic comfort measures Outcome: Progressing   

## 2019-03-03 NOTE — Progress Notes (Signed)
[facility] Progress Note    Assessment/ Plan:   68y/o woman w/PMH significant for multivessel CAD (inoperable),CHF(EF 45-50%), IDDM, HTN, HLD, PVD s/p RAKA, CVA, and CKD stage 4 (follows Dr. Joesph July in [facility]) who presented to [facility] ED with a 3 day history of worsening SOB and lower extremity edema of her left leg s/p rt thoracentesis 6/10 (123ml).  1. AKI/CKD stage 4 vs progressive CKD (no Cr levels for the past 6 months available for review) in setting of acute on chronic combined diastolic and systolic CHF. Agree with IV lasix and follow renal function. 1. I havecontact Dr. Janann August office6/66for baseline Cr levels and she was already in the 4's1st week of Juneand close to having to start dialysis.Pt had been putting off having to make a decision. 2. Appreciate Dr. Donzetta Matters seeingher for access placement. Vein mapping performed. Access surgery planned for Monday. 3. Appreciate Dr. [name] VIR RIJ TC placement 6/12 to allow HD 6/12 which pt tolerated. 4. HD #1 6/12, #2  6/13 and plan for #3 on 6/14 to help w/ UF; still lots of volume onboard and pt still O2 dependent. 5. Will also stop the K replacement now that pt is off Lasix  2. Acute hypoxic respiratory distress- due to CHF. Continue with O2 via [place] and diuresis. 3. Acute on chronic combined diastolic and systolic CHF- as above. CXR with pleural effusions. Follow after IV lasix; not a great response. Currently d/c'd 4. Anemia of CKD-iron deficient -> will load with Feraheme6/11 (received)andstarted ESAwith dialysis (Aranesp 100 IV 6/12). 5. CAD- currently stable, no CP 6. DM- per primary svc 7. HLD- cont with lipitor  Subjective:   Still dyspneic but improved after rt thoracentesis and HD. No fever, chills, nausea.  Tolerated PO's this AM.   Objective:   BP 112/62 (BP Location: Left Arm)   Pulse 85   Temp 99 F (37.2 C) (Oral)   Resp 16   Ht 5\' 4"  (1.626 m)   Wt 74.3 kg   SpO2 94%   BMI  28.10 kg/m   Intake/Output Summary (Last 24 hours) at 03/03/2019 5956 Last data filed at 03/03/2019 0346 Gross per 24 hour  Intake 269.56 ml  Output 2800 ml  Net -2530.44 ml   Weight change: -0.6 kg  Physical Exam: GEN: NAD, A&Ox3, NCAT HEENT: EOMI, +pallor NECK: Supple,+JVD LUNGS:poor air movement, rales at bases CV: RRR ABD: SNDNT +BS  LOV:FIEPP AKA w/ edema, 2+left lowerextremity edema  ACCESS: RIJ TC  Imaging: Ir Fluoro Guide Cv Line Right  Result Date: 03/01/2019 INDICATION: END-STAGE RENAL DISEASE EXAM: ULTRASOUND GUIDANCE FOR VASCULAR ACCESS RIGHT INTERNAL JUGULAR PERMANENT HEMODIALYSIS CATHETER Date:  03/01/2019 03/01/2019 2:11 pm Radiologist:  Jerilynn Mages. Daryll Brod, MD Guidance:  Ultrasound fluoroscopic FLUOROSCOPY TIME:  Fluoroscopy Time: 0 minutes 54 seconds (5 mGy). MEDICATIONS: Ancef 2 g within 1 hour of the procedure ANESTHESIA/SEDATION: Versed 1 mg IV; Fentanyl 25 mcg IV; Moderate Sedation Time:  15 minutes The patient was continuously monitored during the procedure by the interventional radiology nurse under my direct supervision. CONTRAST:  None. COMPLICATIONS: None immediate. PROCEDURE: Informed consent was obtained from the patient following explanation of the procedure, risks, benefits and alternatives. The patient understands, agrees and consents for the procedure. All questions were addressed. A time out was performed. Maximal barrier sterile technique utilized including caps, mask, sterile gowns, sterile gloves, large sterile drape, hand hygiene, and 2% chlorhexidine scrub. Under sterile conditions and local anesthesia, right internal jugular micropuncture venous access was performed with  ultrasound. Images were obtained for documentation of the patent right internal jugular vein. A guide wire was inserted followed by a transitional dilator. Next, a 0.035 guidewire was advanced into the IVC with a 5-French catheter. Measurements were obtained from the right venotomy site  to the proximal right atrium. In the right infraclavicular chest, a subcutaneous tunnel was created under sterile conditions and local anesthesia. 1% lidocaine with epinephrine was utilized for this. The 19 cm tip to cuff dialysis catheter was tunneled subcutaneously to the venotomy site and inserted into the SVC/RA junction through a valved peel-away sheath. Position was confirmed with fluoroscopy. Images were obtained for documentation. Blood was aspirated from the catheter followed by saline and heparin flushes. The appropriate volume and strength of heparin was instilled in each lumen. Caps were applied. The catheter was secured at the tunnel site with Gelfoam and a pursestring suture. The venotomy site was closed with subcuticular Vicryl suture. Dermabond was applied to the small right neck incision. A dry sterile dressing was applied. The catheter is ready for use. No immediate complications. IMPRESSION: Ultrasound and fluoroscopically guided right internal jugular tunneled hemodialysis catheter (19 cm tip to cuff dialysis catheter). Electronically Signed   By: Jerilynn Mages.  Shick M.D.   On: 03/01/2019 14:20   Ir US Guide Vasc Access Right  Result Date: 03/01/2019 INDICATION: END-STAGE RENAL DISEASE EXAM: ULTRASOUND GUIDANCE FOR VASCULAR ACCESS RIGHT INTERNAL JUGULAR PERMANENT HEMODIALYSIS CATHETER Date:  03/01/2019 03/01/2019 2:11 pm Radiologist:  Jerilynn Mages. Daryll Brod, MD Guidance:  Ultrasound fluoroscopic FLUOROSCOPY TIME:  Fluoroscopy Time: 0 minutes 54 seconds (5 mGy). MEDICATIONS: Ancef 2 g within 1 hour of the procedure ANESTHESIA/SEDATION: Versed 1 mg IV; Fentanyl 25 mcg IV; Moderate Sedation Time:  15 minutes The patient was continuously monitored during the procedure by the interventional radiology nurse under my direct supervision. CONTRAST:  None. COMPLICATIONS: None immediate. PROCEDURE: Informed consent was obtained from the patient following explanation of the procedure, risks, benefits and alternatives. The  patient understands, agrees and consents for the procedure. All questions were addressed. A time out was performed. Maximal barrier sterile technique utilized including caps, mask, sterile gowns, sterile gloves, large sterile drape, hand hygiene, and 2% chlorhexidine scrub. Under sterile conditions and local anesthesia, right internal jugular micropuncture venous access was performed with ultrasound. Images were obtained for documentation of the patent right internal jugular vein. A guide wire was inserted followed by a transitional dilator. Next, a 0.035 guidewire was advanced into the IVC with a 5-French catheter. Measurements were obtained from the right venotomy site to the proximal right atrium. In the right infraclavicular chest, a subcutaneous tunnel was created under sterile conditions and local anesthesia. 1% lidocaine with epinephrine was utilized for this. The 19 cm tip to cuff dialysis catheter was tunneled subcutaneously to the venotomy site and inserted into the SVC/RA junction through a valved peel-away sheath. Position was confirmed with fluoroscopy. Images were obtained for documentation. Blood was aspirated from the catheter followed by saline and heparin flushes. The appropriate volume and strength of heparin was instilled in each lumen. Caps were applied. The catheter was secured at the tunnel site with Gelfoam and a pursestring suture. The venotomy site was closed with subcuticular Vicryl suture. Dermabond was applied to the small right neck incision. A dry sterile dressing was applied. The catheter is ready for use. No immediate complications. IMPRESSION: Ultrasound and fluoroscopically guided right internal jugular tunneled hemodialysis catheter (19 cm tip to cuff dialysis catheter). Electronically Signed   By: Jerilynn Mages.  Shick  M.D.   On: 03/01/2019 14:20    Labs: BMET Recent Labs  Lab 02/26/19 1445 02/26/19 2212 02/27/19 0325 02/28/19 6606 03/01/19 0612 03/01/19 1813 03/02/19 0503  03/03/19 0527  NA 143  --  142 143 140 139 141 139  K 4.3  --  4.8 4.3 4.3 3.6 4.0 3.3*  CL 111  --  110 108 107 103 104 104  CO2 20*  --  23 22 24 24 27 27   GLUCOSE 108*  --  190* 81 146* 144* 127* 81  BUN 66*  --  67* 70* 74* 43* 47* 29*  CREATININE 5.00* 5.03* 5.03* 5.56* 5.81* 3.81* 4.24* 3.39*  CALCIUM 9.0  --  8.5* 8.7* 8.3* 8.2* 8.2* 8.3*  PHOS  --   --  4.9* 4.5 4.6 3.0 4.5 3.0   CBC Recent Labs  Lab 02/26/19 1445 02/26/19 2212 03/01/19 1813  WBC 8.3 7.3 7.3  NEUTROABS 5.8  --   --   HGB 8.1* 7.9* 7.6*  HCT 27.3* 26.1*  26.8* 25.2*  MCV 88.3 88.5 87.8  PLT 234 223 154    Medications:    . aspirin  81 mg Oral Daily  . atorvastatin  80 mg Oral Daily  . carvedilol  3.125 mg Oral BID WC  . Chlorhexidine Gluconate Cloth  6 each Topical Q0600  . cholecalciferol  4,000 Units Oral Daily  . hydrALAZINE  10 mg Oral Q8H  . insulin aspart  0-5 Units Subcutaneous QHS  . insulin aspart  0-9 Units Subcutaneous TID WC  . insulin glargine  20 Units Subcutaneous QHS  . isosorbide mononitrate  15 mg Oral BID  . potassium chloride  40 mEq Oral Once  . sodium chloride flush  3 mL Intravenous Q12H      Otelia Santee, MD 03/03/2019, 9:03 AM

## 2019-03-03 NOTE — Progress Notes (Signed)
PROGRESS NOTE    Jenna Brown   ZOX:[id number]  DOB: 1950-09-24  DOA: 02/26/2019 PCP: [name], MD   Brief Narrative:  Jenna Brown  is a 68 y.o. female with medical history significant of coronary artery disease,  congestive heart failure with an ejection fraction of 45 to 50% in November 2019 with associated ischemic cardiomyopathy and hypokinesis, prior stroke, anemia, anxiety, right AKA, stage IV chronic kidney disease, gastroesophageal reflux disease, hypertension, hyperlipidemia, and type 2 diabetes who presents emergency department complaining of worsening dyspnea x3 days. In ED, Cr noted to be 5 from 3.90 on 09/16/18 Troponin 2.38 CXR> moderate pleural effusions Admitted for acute CHF and AKI. Nephrology consulted.   Subjective: She has no new complaints. Feels the swelling in her legs is now making her quite uncomfortable.     Assessment & Plan:   Principal Problem:   Acute on chronic systolic and diastolic congestive heart failure ,      Pleural effusion, bilateral   Acute respiratory failure with hypoxia   AKI on CKD 4-  - ECHO 11/19> 45% to 50%. Hypokinesis of the apicalinferolateral, inferior,   inferoseptal, and apical myocardium. - cardiology consulted- they ordered a thoracentesis on 6/10- Right thora > 1. 2 L  - I spoke with Dr Augustin Coupe who has decided to start dialysis- vascular surgery consulted- plan for fistula vs graft on Monday - NPO on Sunday night - tunnelled right IJ placed on 6/12 by IR and dialysis started - remains on Oxygen support- wean as able  Active Problems: Metabolic acidosis - related to above - resolved  Ongoing pedal edema - place TEDS- left leg- elevate legs   CAD s/p CABG - she stopped her Brillinta about 1 mo ago due to dyspnea - con ASA, Lipitor, Coreg, Imdur, Hydralazine- due to hypotension, I cut back on Hydralazine to 10 TID and Imdur to 15 BID- follow     Anxiety -cont Xanax PRN    Anemia, likely of chronic  disease  - anemia panel reviewed    Insulin-requiring or dependent type II diabetes mellitus  -cont Lantus - start SSI  Right AKA   Time spent in minutes: 35 DVT prophylaxis:  Heparin Code Status: Full code Family Communication:  Disposition Plan: home after vascular surgery for graft/fistula on Monday Consultants:   Nephro  Cardiology  Vascular surgery  IR Procedures:   none Antimicrobials:  Anti-infectives (From admission, onward)   Start     Dose/Rate Route Frequency Ordered Stop   03/04/19 0600  cefUROXime (ZINACEF) 1.5 g in sodium chloride 0.9 % 100 mL IVPB     1.5 g 200 mL/hr over 30 Minutes Intravenous On call to O.R. 03/03/19 0827 03/05/19 0559   03/01/19 1345  ceFAZolin (ANCEF) IVPB 2g/100 mL premix     2 g 200 mL/hr over 30 Minutes Intravenous  Once 03/01/19 1331 03/01/19 1409       Objective: Vitals:   03/02/19 1608 03/02/19 1937 03/03/19 0344 03/03/19 0950  BP: (!) 109/55 114/74 112/62 117/71  Pulse: 85 87 85 83  Resp: 20 18 16    Temp: (!) 97.5 F (36.4 C) 98.1 F (36.7 C) 99 F (37.2 C) 98.4 F (36.9 C)  TempSrc: Oral Oral Oral Oral  SpO2: 100% 100% 94% 100%  Weight:   74.3 kg   Height:        Intake/Output Summary (Last 24 hours) at 03/03/2019 1042 Last data filed at 03/03/2019 0346 Gross per 24 hour  Intake 269.56  ml  Output 2800 ml  Net -2530.44 ml   Filed Weights   03/02/19 1210 03/02/19 1530 03/03/19 0344  Weight: 76.7 kg 74.2 kg 74.3 kg    Examination: General exam: Appears comfortable  HEENT: PERRLA, oral mucosa moist, no sclera icterus or thrush Respiratory system: Clear to auscultation. Respiratory effort normal. Cardiovascular system: S1 & S2 heard,  No murmurs  Gastrointestinal system: Abdomen soft, non-tender, nondistended. Normal bowel sounds   Central nervous system: Alert and oriented. No focal neurological deficits. Extremities: No cyanosis, clubbing - b/l LE edema noted- right AKA Skin: No rashes or  ulcers Psychiatry:  Mood & affect appropriate.   Data Reviewed: I have personally reviewed following labs and imaging studies  CBC: Recent Labs  Lab 02/26/19 1445 02/26/19 2212 03/01/19 1813  WBC 8.3 7.3 7.3  NEUTROABS 5.8  --   --   HGB 8.1* 7.9* 7.6*  HCT 27.3* 26.1*   26.8* 25.2*  MCV 88.3 88.5 87.8  PLT 234 223 301   Basic Metabolic Panel: Recent Labs  Lab 02/28/19 0556 03/01/19 0612 03/01/19 1813 03/02/19 0503 03/03/19 0527  NA 143 140 139 141 139  K 4.3 4.3 3.6 4.0 3.3*  CL 108 107 103 104 104  CO2 22 24 24 27 27   GLUCOSE 81 146* 144* 127* 81  BUN 70* 74* 43* 47* 29*  CREATININE 5.56* 5.81* 3.81* 4.24* 3.39*  CALCIUM 8.7* 8.3* 8.2* 8.2* 8.3*  PHOS 4.5 4.6 3.0 4.5 3.0   GFR: Estimated Creatinine Clearance: 16.3 mL/min (A) (by C-G formula based on SCr of 3.39 mg/dL (H)). Liver Function Tests: Recent Labs  Lab 02/26/19 1445  02/28/19 0556 03/01/19 0612 03/01/19 1813 03/02/19 0503 03/03/19 0527  AST 25  --   --   --   --   --   --   ALT 20  --   --   --   --   --   --   ALKPHOS 103  --   --   --   --   --   --   BILITOT 1.1  --   --   --   --   --   --   PROT 6.8  --   --   --   --   --   --   ALBUMIN 3.4*   < > 2.9* 2.8* 2.8* 2.6* 2.6*   < > = values in this interval not displayed.   No results for input(s): LIPASE, AMYLASE in the last 168 hours. No results for input(s): AMMONIA in the last 168 hours. Coagulation Profile: Recent Labs  Lab 03/01/19 1150  INR 1.2   Cardiac Enzymes: Recent Labs  Lab 02/26/19 1445  TROPONINI 0.16*   BNP (last 3 results) No results for input(s): PROBNP in the last 8760 hours. HbA1C: No results for input(s): HGBA1C in the last 72 hours. CBG: Recent Labs  Lab 03/02/19 0603 03/02/19 1146 03/02/19 1620 03/02/19 2054 03/03/19 0613  GLUCAP 112* 167* 76 212* 72   Lipid Profile: No results for input(s): CHOL, HDL, LDLCALC, TRIG, CHOLHDL, LDLDIRECT in the last 72 hours. Thyroid Function Tests: No results for  input(s): TSH, T4TOTAL, FREET4, T3FREE, THYROIDAB in the last 72 hours. Anemia Panel: No results for input(s): VITAMINB12, FOLATE, FERRITIN, TIBC, IRON, RETICCTPCT in the last 72 hours. Urine analysis:    Component Value Date/Time   COLORURINE YELLOW 09/14/2018 2330   APPEARANCEUR CLOUDY (A) 09/14/2018 2330   LABSPEC 1.014 09/14/2018 2330  PHURINE 7.0 09/14/2018 2330   GLUCOSEU 50 (A) 09/14/2018 2330   HGBUR NEGATIVE 09/14/2018 2330   BILIRUBINUR NEGATIVE 09/14/2018 2330   KETONESUR 5 (A) 09/14/2018 2330   PROTEINUR 100 (A) 09/14/2018 2330   UROBILINOGEN 0.2 04/10/2015 1248   NITRITE NEGATIVE 09/14/2018 2330   LEUKOCYTESUR TRACE (A) 09/14/2018 2330   Sepsis Labs: @LABRCNTIP (procalcitonin:4,lacticidven:4) ) Recent Results (from the past 240 hour(s))  Novel Coronavirus, NAA (hospital order; send-out to ref lab)     Status: None   Collection Time: 02/26/19  5:38 PM   Specimen: Nasopharyngeal Swab; Respiratory  Result Value Ref Range Status   SARS-CoV-2, NAA NOT DETECTED NOT DETECTED Final    Comment: (NOTE) This test was developed and its performance characteristics determined by [employer]. This test has not been FDA cleared or approved. This test has been authorized by FDA under an Emergency Use Authorization (EUA). This test is only authorized for the duration of time the declaration that circumstances exist justifying the authorization of the emergency use of in vitro diagnostic tests for detection of SARS-CoV-2 virus and/or diagnosis of COVID-19 infection under section 564(b)(1) of the Act, 21 U.S.C. 779TJQ-3(E)(0), unless the authorization is terminated or revoked sooner. When diagnostic testing is negative, the possibility of a false negative result should be considered in the context of a patient's recent exposures and the presence of clinical signs and symptoms consistent with COVID-19. An individual without symptoms of COVID-19 and who is not shedding  SARS-CoV-2 virus would expect to have a negative (not detected) result in this assay. Performed  At: [facility] [address] [place], [place] [id number] Rush Farmer MD UQ:[id number]    [place]  Final    Comment: Performed at [facility], [place] [address], [place], [place] [address]         Radiology Studies: Ir Fluoro Guide Cv Line Right  Result Date: 03/01/2019 INDICATION: END-STAGE RENAL DISEASE EXAM: ULTRASOUND GUIDANCE FOR VASCULAR ACCESS RIGHT INTERNAL JUGULAR PERMANENT HEMODIALYSIS CATHETER Date:  03/01/2019 03/01/2019 2:11 pm Radiologist:  Jerilynn Mages. Daryll Brod, MD Guidance:  Ultrasound fluoroscopic FLUOROSCOPY TIME:  Fluoroscopy Time: 0 minutes 54 seconds (5 mGy). MEDICATIONS: Ancef 2 g within 1 hour of the procedure ANESTHESIA/SEDATION: Versed 1 mg IV; Fentanyl 25 mcg IV; Moderate Sedation Time:  15 minutes The patient was continuously monitored during the procedure by the interventional radiology nurse under my direct supervision. CONTRAST:  None. COMPLICATIONS: None immediate. PROCEDURE: Informed consent was obtained from the patient following explanation of the procedure, risks, benefits and alternatives. The patient understands, agrees and consents for the procedure. All questions were addressed. A time out was performed. Maximal barrier sterile technique utilized including caps, mask, sterile gowns, sterile gloves, large sterile drape, hand hygiene, and 2% chlorhexidine scrub. Under sterile conditions and local anesthesia, right internal jugular micropuncture venous access was performed with ultrasound. Images were obtained for documentation of the patent right internal jugular vein. A guide wire was inserted followed by a transitional dilator. Next, a 0.035 guidewire was advanced into the IVC with a 5-French catheter. Measurements were obtained from the right venotomy site to the proximal right atrium. In the right infraclavicular chest, a  subcutaneous tunnel was created under sterile conditions and local anesthesia. 1% lidocaine with epinephrine was utilized for this. The 19 cm tip to cuff dialysis catheter was tunneled subcutaneously to the venotomy site and inserted into the SVC/RA junction through a valved peel-away sheath. Position was confirmed with fluoroscopy. Images were obtained for documentation.  Blood was aspirated from the catheter followed by saline and heparin flushes. The appropriate volume and strength of heparin was instilled in each lumen. Caps were applied. The catheter was secured at the tunnel site with Gelfoam and a pursestring suture. The venotomy site was closed with subcuticular Vicryl suture. Dermabond was applied to the small right neck incision. A dry sterile dressing was applied. The catheter is ready for use. No immediate complications. IMPRESSION: Ultrasound and fluoroscopically guided right internal jugular tunneled hemodialysis catheter (19 cm tip to cuff dialysis catheter). Electronically Signed   By: Jerilynn Mages.  Shick M.D.   On: 03/01/2019 14:20   Ir US Guide Vasc Access Right  Result Date: 03/01/2019 INDICATION: END-STAGE RENAL DISEASE EXAM: ULTRASOUND GUIDANCE FOR VASCULAR ACCESS RIGHT INTERNAL JUGULAR PERMANENT HEMODIALYSIS CATHETER Date:  03/01/2019 03/01/2019 2:11 pm Radiologist:  Jerilynn Mages. Daryll Brod, MD Guidance:  Ultrasound fluoroscopic FLUOROSCOPY TIME:  Fluoroscopy Time: 0 minutes 54 seconds (5 mGy). MEDICATIONS: Ancef 2 g within 1 hour of the procedure ANESTHESIA/SEDATION: Versed 1 mg IV; Fentanyl 25 mcg IV; Moderate Sedation Time:  15 minutes The patient was continuously monitored during the procedure by the interventional radiology nurse under my direct supervision. CONTRAST:  None. COMPLICATIONS: None immediate. PROCEDURE: Informed consent was obtained from the patient following explanation of the procedure, risks, benefits and alternatives. The patient understands, agrees and consents for the procedure. All  questions were addressed. A time out was performed. Maximal barrier sterile technique utilized including caps, mask, sterile gowns, sterile gloves, large sterile drape, hand hygiene, and 2% chlorhexidine scrub. Under sterile conditions and local anesthesia, right internal jugular micropuncture venous access was performed with ultrasound. Images were obtained for documentation of the patent right internal jugular vein. A guide wire was inserted followed by a transitional dilator. Next, a 0.035 guidewire was advanced into the IVC with a 5-French catheter. Measurements were obtained from the right venotomy site to the proximal right atrium. In the right infraclavicular chest, a subcutaneous tunnel was created under sterile conditions and local anesthesia. 1% lidocaine with epinephrine was utilized for this. The 19 cm tip to cuff dialysis catheter was tunneled subcutaneously to the venotomy site and inserted into the SVC/RA junction through a valved peel-away sheath. Position was confirmed with fluoroscopy. Images were obtained for documentation. Blood was aspirated from the catheter followed by saline and heparin flushes. The appropriate volume and strength of heparin was instilled in each lumen. Caps were applied. The catheter was secured at the tunnel site with Gelfoam and a pursestring suture. The venotomy site was closed with subcuticular Vicryl suture. Dermabond was applied to the small right neck incision. A dry sterile dressing was applied. The catheter is ready for use. No immediate complications. IMPRESSION: Ultrasound and fluoroscopically guided right internal jugular tunneled hemodialysis catheter (19 cm tip to cuff dialysis catheter). Electronically Signed   By: Jerilynn Mages.  Shick M.D.   On: 03/01/2019 14:20      Scheduled Meds:  aspirin  81 mg Oral Daily   atorvastatin  80 mg Oral Daily   carvedilol  3.125 mg Oral BID WC   Chlorhexidine Gluconate Cloth  6 each Topical Q0600   cholecalciferol  4,000  Units Oral Daily   hydrALAZINE  10 mg Oral Q8H   insulin aspart  0-5 Units Subcutaneous QHS   insulin aspart  0-9 Units Subcutaneous TID WC   insulin glargine  20 Units Subcutaneous QHS   isosorbide mononitrate  15 mg Oral BID   sodium chloride flush  3 mL Intravenous  Q12H   Continuous Infusions:  sodium chloride     sodium chloride Stopped (03/02/19 1029)   [START ON 03/04/2019] cefUROXime (ZINACEF)  IV       LOS: 5 days      Debbe Odea, MD [facility] Pager: [email or web address] Password [facility] 03/03/2019, 10:42 AM

## 2019-03-04 ENCOUNTER — Inpatient Hospital Stay (HOSPITAL_COMMUNITY): Payer: Medicare Other | Admitting: Anesthesiology

## 2019-03-04 ENCOUNTER — Encounter (HOSPITAL_COMMUNITY)
Admission: EM | Disposition: A | Discharge status: Home / Self Care | Payer: Self-pay | Source: Home / Self Care | Attending: Internal Medicine

## 2019-03-04 ENCOUNTER — Encounter (HOSPITAL_COMMUNITY): Payer: Self-pay | Admitting: Anesthesiology

## 2019-03-04 DIAGNOSIS — N184 Chronic kidney disease, stage 4 (severe): Secondary | ICD-10-CM

## 2019-03-04 HISTORY — PX: AV FISTULA PLACEMENT: SHX1204

## 2019-03-04 LAB — RENAL FUNCTION PANEL
Albumin: 2.9 g/dL — ABNORMAL LOW (ref 3.5–5.0)
Anion gap: 9 (ref 5–15)
BUN: 39 mg/dL — ABNORMAL HIGH (ref 8–23)
CO2: 27 mmol/L (ref 22–32)
Calcium: 8.5 mg/dL — ABNORMAL LOW (ref 8.9–10.3)
Chloride: 103 mmol/L (ref 98–111)
Creatinine, Ser: 4.41 mg/dL — ABNORMAL HIGH (ref 0.44–1.00)
GFR calc Af Amer: 11 mL/min — ABNORMAL LOW (ref >60)
GFR calc non Af Amer: 10 mL/min — ABNORMAL LOW (ref >60)
Glucose, Bld: 160 mg/dL — ABNORMAL HIGH (ref 70–99)
Phosphorus: 3 mg/dL (ref 2.5–4.6)
Potassium: 3.8 mmol/L (ref 3.5–5.1)
Sodium: 139 mmol/L (ref 135–145)

## 2019-03-04 LAB — SURGICAL PCR SCREEN
MRSA, PCR: NEGATIVE
Staphylococcus aureus: NEGATIVE

## 2019-03-04 LAB — CBC
HCT: 29 % — ABNORMAL LOW (ref 36.0–46.0)
Hemoglobin: 8.4 g/dL — ABNORMAL LOW (ref 12.0–15.0)
MCH: 26.5 pg (ref 26.0–34.0)
MCHC: 29 g/dL — ABNORMAL LOW (ref 30.0–36.0)
MCV: 91.5 fL (ref 80.0–100.0)
Platelets: 144 10*3/uL — ABNORMAL LOW (ref 150–400)
RBC: 3.17 MIL/uL — ABNORMAL LOW (ref 3.87–5.11)
RDW: 15.1 % (ref 11.5–15.5)
WBC: 9 10*3/uL (ref 4.0–10.5)
nRBC: 0.3 % — ABNORMAL HIGH (ref 0.0–0.2)

## 2019-03-04 LAB — POCT I-STAT, CHEM 8
BUN: 14 mg/dL (ref 8–23)
Calcium, Ion: 1.09 mmol/L — ABNORMAL LOW (ref 1.15–1.40)
Chloride: 99 mmol/L (ref 98–111)
Creatinine, Ser: 1.9 mg/dL — ABNORMAL HIGH (ref 0.44–1.00)
Glucose, Bld: 99 mg/dL (ref 70–99)
HCT: 30 % — ABNORMAL LOW (ref 36.0–46.0)
Hemoglobin: 10.2 g/dL — ABNORMAL LOW (ref 12.0–15.0)
Potassium: 4 mmol/L (ref 3.5–5.1)
Sodium: 139 mmol/L (ref 135–145)
TCO2: 28 mmol/L (ref 22–32)

## 2019-03-04 LAB — GLUCOSE, CAPILLARY
Glucose-Capillary: 135 mg/dL — ABNORMAL HIGH (ref 70–99)
Glucose-Capillary: 79 mg/dL (ref 70–99)
Glucose-Capillary: 85 mg/dL (ref 70–99)
Glucose-Capillary: 98 mg/dL (ref 70–99)
Glucose-Capillary: 223 mg/dL — ABNORMAL HIGH (ref 70–99)
Glucose-Capillary: 212 mg/dL — ABNORMAL HIGH (ref 70–99)

## 2019-03-04 LAB — PROTIME-INR
INR: 1.1 (ref 0.8–1.2)
Prothrombin Time: 14.4 seconds (ref 11.4–15.2)

## 2019-03-04 SURGERY — ARTERIOVENOUS (AV) FISTULA CREATION
Anesthesia: General | Site: Arm Upper | Laterality: Right

## 2019-03-04 MED ORDER — LIDOCAINE-EPINEPHRINE 0.5 %-1:200000 IJ SOLN
INTRAMUSCULAR | Status: DC | PRN
  Administered 2019-03-04: 50 mL

## 2019-03-04 MED ORDER — SODIUM CHLORIDE 0.9 % IV SOLN
INTRAVENOUS | Status: AC
  Filled 2019-03-04: qty 1.2

## 2019-03-04 MED ORDER — LIDOCAINE-EPINEPHRINE 0.5 %-1:200000 IJ SOLN
INTRAMUSCULAR | Status: AC
  Filled 2019-03-04: qty 1

## 2019-03-04 MED ORDER — DARBEPOETIN ALFA 200 MCG/0.4ML IJ SOSY
PREFILLED_SYRINGE | INTRAMUSCULAR | Status: AC
  Administered 2019-03-04: 200 ug via INTRAVENOUS
  Filled 2019-03-04: qty 0.4

## 2019-03-04 MED ORDER — SODIUM CHLORIDE 0.9 % IV SOLN
INTRAVENOUS | Status: DC
  Administered 2019-03-04: 11:00:00 via INTRAVENOUS

## 2019-03-04 MED ORDER — PROPOFOL 500 MG/50ML IV EMUL
INTRAVENOUS | Status: DC | PRN
  Administered 2019-03-04: 50 ug/kg/min via INTRAVENOUS

## 2019-03-04 MED ORDER — MIDAZOLAM HCL 2 MG/2ML IJ SOLN
INTRAMUSCULAR | Status: AC
  Filled 2019-03-04: qty 2

## 2019-03-04 MED ORDER — SODIUM CHLORIDE 0.9 % IV SOLN: INTRAVENOUS | Status: DC | PRN

## 2019-03-04 MED ORDER — RENA-VITE PO TABS
1.0000 | ORAL_TABLET | Freq: Every day | ORAL | Status: DC
  Administered 2019-03-04 – 2019-03-05 (×2): 1 via ORAL
  Filled 2019-03-04 (×2): qty 1

## 2019-03-04 MED ORDER — FENTANYL CITRATE (PF) 100 MCG/2ML IJ SOLN: 25.0000 ug | INTRAMUSCULAR | Status: DC | PRN

## 2019-03-04 MED ORDER — PROPOFOL 10 MG/ML IV BOLUS
INTRAVENOUS | Status: DC | PRN
  Administered 2019-03-04: 25 mg via INTRAVENOUS

## 2019-03-04 MED ORDER — ACETAMINOPHEN 500 MG PO TABS
1000.0000 mg | ORAL_TABLET | Freq: Once | ORAL | Status: AC
  Administered 2019-03-04: 1000 mg via ORAL
  Filled 2019-03-04: qty 2

## 2019-03-04 MED ORDER — SODIUM CHLORIDE 0.9 % IV SOLN
INTRAVENOUS | Status: DC | PRN
  Administered 2019-03-04: 20 ug/min via INTRAVENOUS

## 2019-03-04 MED ORDER — FENTANYL CITRATE (PF) 100 MCG/2ML IJ SOLN
INTRAMUSCULAR | Status: DC | PRN
  Administered 2019-03-04 (×2): 25 ug via INTRAVENOUS

## 2019-03-04 MED ORDER — SODIUM CHLORIDE 0.9 % IV SOLN
INTRAVENOUS | Status: DC | PRN
  Administered 2019-03-04: 500 mL

## 2019-03-04 MED ORDER — DEXAMETHASONE SODIUM PHOSPHATE 10 MG/ML IJ SOLN
INTRAMUSCULAR | Status: DC | PRN
  Administered 2019-03-04: 5 mg via INTRAVENOUS

## 2019-03-04 MED ORDER — 0.9 % SODIUM CHLORIDE (POUR BTL) OPTIME
TOPICAL | Status: DC | PRN
  Administered 2019-03-04: 1000 mL

## 2019-03-04 MED ORDER — MUPIROCIN 2 % EX OINT
1.0000 "application " | TOPICAL_OINTMENT | Freq: Two times a day (BID) | CUTANEOUS | Status: DC
  Administered 2019-03-04 – 2019-03-06 (×4): 1 via NASAL
  Filled 2019-03-04 (×2): qty 22

## 2019-03-04 MED ORDER — HEPARIN SODIUM (PORCINE) 1000 UNIT/ML IJ SOLN
INTRAMUSCULAR | Status: AC
  Filled 2019-03-04: qty 1

## 2019-03-04 MED ORDER — MIDAZOLAM HCL 5 MG/5ML IJ SOLN
INTRAMUSCULAR | Status: DC | PRN
  Administered 2019-03-04: 0.5 mg via INTRAVENOUS

## 2019-03-04 MED ORDER — FENTANYL CITRATE (PF) 250 MCG/5ML IJ SOLN
INTRAMUSCULAR | Status: AC
  Filled 2019-03-04: qty 5

## 2019-03-04 MED ORDER — DARBEPOETIN ALFA 200 MCG/0.4ML IJ SOSY
200.0000 ug | PREFILLED_SYRINGE | INTRAMUSCULAR | Status: DC
  Administered 2019-03-04: 10:00:00 200 ug via INTRAVENOUS

## 2019-03-04 MED ORDER — PROPOFOL 10 MG/ML IV BOLUS
INTRAVENOUS | Status: AC
  Filled 2019-03-04: qty 20

## 2019-03-04 SURGICAL SUPPLY — 45 items
ADH SKN CLS APL DERMABOND .7 (GAUZE/BANDAGES/DRESSINGS) ×2
ARMBAND PINK RESTRICT EXTREMIT (MISCELLANEOUS) ×2 IMPLANT
CANISTER SUCT 3000ML PPV (MISCELLANEOUS) ×3 IMPLANT
CANNULA VESSEL 3MM 2 BLNT TIP (CANNULA) ×3 IMPLANT
CLIP LIGATING EXTRA MED SLVR (CLIP) ×3 IMPLANT
CLIP LIGATING EXTRA SM BLUE (MISCELLANEOUS) ×3 IMPLANT
COVER PROBE W GEL 5X96 (DRAPES) ×3 IMPLANT
COVER SURGICAL LIGHT HANDLE (MISCELLANEOUS) ×3 IMPLANT
COVER WAND RF STERILE (DRAPES) ×1 IMPLANT
DECANTER SPIKE VIAL GLASS SM (MISCELLANEOUS) ×3 IMPLANT
DERMABOND ADVANCED (GAUZE/BANDAGES/DRESSINGS) ×1
DERMABOND ADVANCED .7 DNX12 (GAUZE/BANDAGES/DRESSINGS) ×2 IMPLANT
ELECT REM PT RETURN 9FT ADLT (ELECTROSURGICAL) ×3
ELECTRODE REM PT RTRN 9FT ADLT (ELECTROSURGICAL) ×2 IMPLANT
GAUZE 4X4 16PLY RFD (DISPOSABLE) ×1 IMPLANT
GLOVE BIOGEL PI IND STRL 6.5 (GLOVE) ×1 IMPLANT
GLOVE BIOGEL PI IND STRL 7.5 (GLOVE) ×1 IMPLANT
GLOVE BIOGEL PI IND STRL 8 (GLOVE) ×1 IMPLANT
GLOVE BIOGEL PI INDICATOR 6.5 (GLOVE) ×1
GLOVE BIOGEL PI INDICATOR 7.5 (GLOVE) ×1
GLOVE BIOGEL PI INDICATOR 8 (GLOVE) ×1
GLOVE ECLIPSE 7.0 STRL STRAW (GLOVE) ×2 IMPLANT
GLOVE SS BIOGEL STRL SZ 7.5 (GLOVE) ×2 IMPLANT
GLOVE SUPERSENSE BIOGEL SZ 7.5 (GLOVE) ×1
GLOVE SURG SS PI 6.5 STRL IVOR (GLOVE) ×2 IMPLANT
GOWN STRL REUS W/ TWL LRG LVL3 (GOWN DISPOSABLE) ×6 IMPLANT
GOWN STRL REUS W/TWL LRG LVL3 (GOWN DISPOSABLE) ×9
KIT BASIN OR (CUSTOM PROCEDURE TRAY) ×3 IMPLANT
KIT TURNOVER KIT B (KITS) ×3 IMPLANT
NS IRRIG 1000ML POUR BTL (IV SOLUTION) ×3 IMPLANT
PACK CV ACCESS (CUSTOM PROCEDURE TRAY) ×3 IMPLANT
PACK SURGICAL SETUP 50X90 (CUSTOM PROCEDURE TRAY) ×3 IMPLANT
PAD ARMBOARD 7.5X6 YLW CONV (MISCELLANEOUS) ×6 IMPLANT
SUT PROLENE 6 0 CC (SUTURE) ×3 IMPLANT
SUT VIC AB 3-0 SH 27 (SUTURE) ×3
SUT VIC AB 3-0 SH 27X BRD (SUTURE) ×2 IMPLANT
SUT VICRYL 4-0 PS2 18IN ABS (SUTURE) ×3 IMPLANT
SYR 10ML LL (SYRINGE) ×1 IMPLANT
SYR 20CC LL (SYRINGE) ×1 IMPLANT
SYR 5ML LL (SYRINGE) ×2 IMPLANT
SYR CONTROL 10ML LL (SYRINGE) ×1 IMPLANT
TOWEL GREEN STERILE (TOWEL DISPOSABLE) ×6 IMPLANT
TOWEL GREEN STERILE FF (TOWEL DISPOSABLE) ×3 IMPLANT
UNDERPAD 30X30 (UNDERPADS AND DIAPERS) ×3 IMPLANT
WATER STERILE IRR 1000ML POUR (IV SOLUTION) ×3 IMPLANT

## 2019-03-04 NOTE — Discharge Instructions (Signed)
° °  [facility] ° °Discharge Instructions ° °AV Fistula or Graft Surgery for Dialysis Access ° °Please refer to the following instructions for your post-procedure care. Your surgeon or physician assistant will discuss any changes with you. ° °Activity ° °You may drive the day following your surgery, if you are comfortable and no longer taking prescription pain medication. Resume full activity as the soreness in your incision resolves. ° °Bathing/Showering ° °You may shower after you go home. Keep your incision dry for 48 hours. Do not soak in a bathtub, hot tub, or swim until the incision heals completely. You may not shower if you have a hemodialysis catheter. ° °Incision Care ° °Clean your incision with mild soap and water after 48 hours. Pat the area dry with a clean towel. You do not need a bandage unless otherwise instructed. Do not apply any ointments or creams to your incision. You may have skin glue on your incision. Do not peel it off. It will come off on its own in about one week. Your arm may swell a bit after surgery. To reduce swelling use pillows to elevate your arm so it is above your heart. Your doctor will tell you if you need to lightly wrap your arm with an ACE bandage. ° °Diet ° °Resume your normal diet. There are not special food restrictions following this procedure. In order to heal from your surgery, it is CRITICAL to get adequate nutrition. Your body requires vitamins, minerals, and protein. Vegetables are the best source of vitamins and minerals. Vegetables also provide the perfect balance of protein. Processed food has little nutritional value, so try to avoid this. ° °Medications ° °Resume taking all of your medications. If your incision is causing pain, you may take over-the counter pain relievers such as acetaminophen (Tylenol). If you were prescribed a stronger pain medication, please be aware these medications can cause nausea and constipation. Prevent  nausea by taking the medication with a snack or meal. Avoid constipation by drinking plenty of fluids and eating foods with high amount of fiber, such as fruits, vegetables, and grains. Do not take Tylenol if you are taking prescription pain medications. ° ° ° ° °Follow up °Your surgeon may want to see you in the office following your access surgery. If so, this will be arranged at the time of your surgery. ° °Please call us immediately for any of the following conditions: ° °Increased pain, redness, drainage (pus) from your incision site °Fever of 101 degrees or higher °Severe or worsening pain at your incision site °Hand pain or numbness. ° °Reduce your risk of vascular disease: ° °Stop smoking. If you would like help, call [facility] at 1-800-QUIT-NOW (1-800-784-8669) or [place] at 336-586-4000 ° °Manage your cholesterol °Maintain a desired weight °Control your diabetes °Keep your blood pressure down ° °Dialysis ° °It will take several weeks to several months for your new dialysis access to be ready for use. Your surgeon will determine when it is OK to use it. Your nephrologist will continue to direct your dialysis. You can continue to use your Permcath until your new access is ready for use. ° °If you have any questions, please call the office at [phone]. ° °

## 2019-03-04 NOTE — Anesthesia Preprocedure Evaluation (Addendum)
Anesthesia Evaluation    Reviewed: Allergy & Precautions, Patient's Chart, lab work & pertinent test results  History of Anesthesia Complications (+) PONV and history of anesthetic complications  Airway Mallampati: II  TM Distance: >3 FB Neck ROM: Full    Dental no notable dental hx.    Pulmonary neg pulmonary ROS, former smoker,    Pulmonary exam normal breath sounds clear to auscultation       Cardiovascular hypertension, Pt. on medications + angina + CAD, + Past MI and + Peripheral Vascular Disease  Normal cardiovascular exam Rhythm:Regular Rate:Normal  TTE 2019 - EF using wall tracking approximately 45%. Hypokinesis of distal inferior, inferolateral, inferoseptal, and apical walls. Wall motion visually appears similar to prior. Thickened LCC of aortic valve appears similar to prior.  LHC 2016 Prox LAD to Mid LAD lesion, 90% stenosed. Ost Cx to Mid Cx lesion, 95% stenosed. Ost 2nd Mrg to 2nd Mrg lesion, 80% stenosed. Mid Cx lesion, 90% stenosed. Prox RCA lesion, 60% stenosed. Mid RCA lesion, 50% stenosed.   Neuro/Psych PSYCHIATRIC DISORDERS Anxiety CVA    GI/Hepatic Neg liver ROS, GERD  ,  Endo/Other  negative endocrine ROSdiabetes, Insulin Dependent  Renal/GU ESRF and DialysisRenal disease (K+ 4.0)  negative genitourinary   Musculoskeletal negative musculoskeletal ROS (+)   Abdominal   Peds  Hematology  (+) Blood dyscrasia, anemia ,   Anesthesia Other Findings   Reproductive/Obstetrics                           Anesthesia Physical Anesthesia Plan  ASA: III  Anesthesia Plan: MAC   Post-op Pain Management:    Induction: Intravenous  PONV Risk Score and Plan: 4 or greater and Treatment may vary due to age or medical condition, Midazolam, Ondansetron and Dexamethasone  Airway Management Planned: Oral ETT and LMA  Additional Equipment:   Intra-op Plan:   Post-operative  Plan: Extubation in OR  Informed Consent: I have reviewed the patients History and Physical, chart, labs and discussed the procedure including the risks, benefits and alternatives for the proposed anesthesia with the patient or authorized representative who has indicated his/her understanding and acceptance.     Dental advisory given  Plan Discussed with: CRNA  Anesthesia Plan Comments:       Anesthesia Quick Evaluation

## 2019-03-04 NOTE — Progress Notes (Signed)
PROGRESS NOTE    Jenna Brown   JJK:[id number]  DOB: May 03, 1951  DOA: 02/26/2019 PCP: [name], MD   Brief Narrative:  Jenna Brown  is a 68 y.o. female with medical history significant of coronary artery disease,  congestive heart failure with an ejection fraction of 45 to 50% in November 2019 with associated ischemic cardiomyopathy and hypokinesis, prior stroke, anemia, anxiety, right AKA, stage IV chronic kidney disease, gastroesophageal reflux disease, hypertension, hyperlipidemia, and type 2 diabetes who presents emergency department complaining of worsening dyspnea x3 days. In ED, Cr noted to be 5 from 3.90 on 09/16/18 Troponin 2.38 CXR> moderate pleural effusions Admitted for acute CHF and AKI. Nephrology consulted.   Subjective: No new complaints.     Assessment & Plan:   Principal Problem:   Acute on chronic systolic and diastolic congestive heart failure ,      Pleural effusion, bilateral   Acute respiratory failure with hypoxia   AKI on CKD 4-  - ECHO 11/19> 45% to 50%. Hypokinesis of the apicalinferolateral, inferior,   inferoseptal, and apical myocardium. - cardiology consulted- they ordered a thoracentesis on 6/10- Right thora > 1. 2 L  - tunnelled right IJ placed on 6/12 by IR and dialysis started - right upper arm AV fistula created today - remains fluid overloaded- renal feels that she needs more in hospital dialysis - remains on Oxygen support- wean as able  Active Problems: Metabolic acidosis - related to above - resolved  Ongoing pedal edema - place TEDS- left leg- elevate legs   CAD s/p CABG - she stopped her Brillinta about 1 mo ago due to dyspnea - con ASA, Lipitor, Coreg, Imdur, Hydralazine- due to hypotension, I cut back on Hydralazine to 10 TID and Imdur to 15 BID- follow     Anxiety -cont Xanax PRN    Anemia, likely of chronic disease  - anemia panel reviewed    Insulin-requiring or dependent type II diabetes mellitus  -cont  Lantus - start SSI  Right AKA   Time spent in minutes: 35 DVT prophylaxis:  Heparin Code Status: Full code Family Communication:  Disposition Plan: not ready to d/c yet per nephro- needs more dialysis Consultants:   Nephro  Cardiology  Vascular surgery  IR Procedures:   none Antimicrobials:  Anti-infectives (From admission, onward)   Start     Dose/Rate Route Frequency Ordered Stop   03/04/19 0600  cefUROXime (ZINACEF) 1.5 g in sodium chloride 0.9 % 100 mL IVPB     1.5 g 200 mL/hr over 30 Minutes Intravenous On call to O.R. 03/03/19 0827 03/04/19 1127   03/01/19 1345  ceFAZolin (ANCEF) IVPB 2g/100 mL premix     2 g 200 mL/hr over 30 Minutes Intravenous  Once 03/01/19 1331 03/01/19 1409       Objective: Vitals:   03/04/19 1320 03/04/19 1335 03/04/19 1420 03/04/19 1449  BP: (!) 97/50 (!) 101/54 96/77 106/64  Pulse: 78 83 80   Resp: (!) 38 16 19 16   Temp:  98.5 F (36.9 C)  98.2 F (36.8 C)  TempSrc:      SpO2: 99% 97% 100% 100%  Weight:      Height:        Intake/Output Summary (Last 24 hours) at 03/04/2019 1523 Last data filed at 03/04/2019 1247 Gross per 24 hour  Intake 860 ml  Output 2021 ml  Net -1161 ml   Filed Weights   03/04/19 0649 03/04/19 1041 03/04/19 1055  Weight: 74.7  kg 75.7 kg 74.7 kg    Examination: General exam: Appears comfortable  HEENT: PERRLA, oral mucosa moist, no sclera icterus or thrush Respiratory system: Clear to auscultation. Respiratory effort normal. Cardiovascular system: S1 & S2 heard,  No murmurs  Gastrointestinal system: Abdomen soft, non-tender, nondistended. Normal bowel sounds   Central nervous system: Alert and oriented. No focal neurological deficits. Extremities: No cyanosis, clubbing- continues to have b/l LE edema Skin: No rashes or ulcers Psychiatry:  Mood & affect appropriate.    Data Reviewed: I have personally reviewed following labs and imaging studies  CBC: Recent Labs  Lab 02/26/19 1445 02/26/19  2212 03/01/19 1813 03/04/19 0558 03/04/19 0955  WBC 8.3 7.3 7.3 9.0  --   NEUTROABS 5.8  --   --   --   --   HGB 8.1* 7.9* 7.6* 8.4* 10.2*  HCT 27.3* 26.1*  26.8* 25.2* 29.0* 30.0*  MCV 88.3 88.5 87.8 91.5  --   PLT 234 223 154 144*  --    Basic Metabolic Panel: Recent Labs  Lab 03/01/19 0612 03/01/19 1813 03/02/19 0503 03/03/19 0527 03/04/19 0558 03/04/19 0955  NA 140 139 141 139 139 139  K 4.3 3.6 4.0 3.3* 3.8 4.0  CL 107 103 104 104 103 99  CO2 24 24 27 27 27   --   GLUCOSE 146* 144* 127* 81 160* 99  BUN 74* 43* 47* 29* 39* 14  CREATININE 5.81* 3.81* 4.24* 3.39* 4.41* 1.90*  CALCIUM 8.3* 8.2* 8.2* 8.3* 8.5*  --   PHOS 4.6 3.0 4.5 3.0 3.0  --    GFR: Estimated Creatinine Clearance: 29.2 mL/min (A) (by C-G formula based on SCr of 1.9 mg/dL (H)). Liver Function Tests: Recent Labs  Lab 02/26/19 1445  03/01/19 0612 03/01/19 1813 03/02/19 0503 03/03/19 0527 03/04/19 0558  AST 25  --   --   --   --   --   --   ALT 20  --   --   --   --   --   --   ALKPHOS 103  --   --   --   --   --   --   BILITOT 1.1  --   --   --   --   --   --   PROT 6.8  --   --   --   --   --   --   ALBUMIN 3.4*   < > 2.8* 2.8* 2.6* 2.6* 2.9*   < > = values in this interval not displayed.   No results for input(s): LIPASE, AMYLASE in the last 168 hours. No results for input(s): AMMONIA in the last 168 hours. Coagulation Profile: Recent Labs  Lab 03/01/19 1150 03/04/19 0558  INR 1.2 1.1   Cardiac Enzymes: Recent Labs  Lab 02/26/19 1445  TROPONINI 0.16*   BNP (last 3 results) No results for input(s): PROBNP in the last 8760 hours. HbA1C: No results for input(s): HGBA1C in the last 72 hours. CBG: Recent Labs  Lab 03/03/19 2150 03/04/19 0616 03/04/19 1115 03/04/19 1258 03/04/19 1446  GLUCAP 155* 135* 79 85 98   Lipid Profile: No results for input(s): CHOL, HDL, LDLCALC, TRIG, CHOLHDL, LDLDIRECT in the last 72 hours. Thyroid Function Tests: No results for input(s): TSH,  T4TOTAL, FREET4, T3FREE, THYROIDAB in the last 72 hours. Anemia Panel: No results for input(s): VITAMINB12, FOLATE, FERRITIN, TIBC, IRON, RETICCTPCT in the last 72 hours. Urine analysis:    Component Value Date/Time  COLORURINE YELLOW 09/14/2018 2330   APPEARANCEUR CLOUDY (A) 09/14/2018 2330   LABSPEC 1.014 09/14/2018 2330   PHURINE 7.0 09/14/2018 2330   GLUCOSEU 50 (A) 09/14/2018 2330   HGBUR NEGATIVE 09/14/2018 2330   BILIRUBINUR NEGATIVE 09/14/2018 2330   KETONESUR 5 (A) 09/14/2018 2330   PROTEINUR 100 (A) 09/14/2018 2330   UROBILINOGEN 0.2 04/10/2015 1248   NITRITE NEGATIVE 09/14/2018 2330   LEUKOCYTESUR TRACE (A) 09/14/2018 2330   Sepsis Labs: @LABRCNTIP (procalcitonin:4,lacticidven:4) ) Recent Results (from the past 240 hour(s))  Novel Coronavirus, NAA (hospital order; send-out to ref lab)     Status: None   Collection Time: 02/26/19  5:38 PM   Specimen: Nasopharyngeal Swab; Respiratory  Result Value Ref Range Status   SARS-CoV-2, NAA NOT DETECTED NOT DETECTED Final    Comment: (NOTE) This test was developed and its performance characteristics determined by [employer]. This test has not been FDA cleared or approved. This test has been authorized by FDA under an Emergency Use Authorization (EUA). This test is only authorized for the duration of time the declaration that circumstances exist justifying the authorization of the emergency use of in vitro diagnostic tests for detection of SARS-CoV-2 virus and/or diagnosis of COVID-19 infection under section 564(b)(1) of the Act, 21 U.S.C. 779TJQ-3(E)(0), unless the authorization is terminated or revoked sooner. When diagnostic testing is negative, the possibility of a false negative result should be considered in the context of a patient's recent exposures and the presence of clinical signs and symptoms consistent with COVID-19. An individual without symptoms of COVID-19 and who is not shedding SARS-CoV-2 virus  would expect to have a negative (not detected) result in this assay. Performed  At: [facility] [address] [place], [place] [id number] Rush Farmer MD UQ:[id number]    [place]  Final    Comment: Performed at [facility], [place] [address] [place], [place] [address]  Surgical PCR screen     Status: None   Collection Time: 03/04/19  5:09 AM   Specimen: Nasal Mucosa; Nasal Swab  Result Value Ref Range Status   MRSA, PCR NEGATIVE NEGATIVE Final   Staphylococcus aureus NEGATIVE NEGATIVE Final    Comment: (NOTE) The Xpert SA Assay (FDA approved for NASAL specimens in patients 50 years of age and older), is one component of a comprehensive surveillance program. It is not intended to diagnose infection nor to guide or monitor treatment. Performed at [facility], [place] [address], [place], [place] [address]          Radiology Studies: No results found.    Scheduled Meds: . aspirin  81 mg Oral Daily  . atorvastatin  80 mg Oral Daily  . Chlorhexidine Gluconate Cloth  6 each Topical Q0600  . darbepoetin (ARANESP) injection - DIALYSIS  200 mcg Intravenous Q Mon-HD  . heparin      . hydrALAZINE  10 mg Oral Q8H  . insulin aspart  0-5 Units Subcutaneous QHS  . insulin aspart  0-9 Units Subcutaneous TID WC  . insulin glargine  20 Units Subcutaneous QHS  . isosorbide mononitrate  15 mg Oral BID  . multivitamin  1 tablet Oral QHS  . mupirocin ointment  1 application Nasal BID  . sodium chloride flush  3 mL Intravenous Q12H   Continuous Infusions: . sodium chloride    . sodium chloride Stopped (03/02/19 1029)  . sodium chloride Stopped (03/04/19 1247)     LOS: 6 days      Tawna Alwin  Wynelle Cleveland, MD [facility] Pager: [email or web address] Password [facility] 03/04/2019, 3:23 PM

## 2019-03-04 NOTE — Procedures (Signed)
I was present at this session.  I have reviewed the session itself and made appropriate changes. HD via PC. Flow 300, bp 90-110.  3rd HD. To get aVF  Jeneen Rinks Latonya Nelon 6/15/20208:39 AM

## 2019-03-04 NOTE — Interval H&P Note (Signed)
History and Physical Interval Note:  03/04/2019 11:03 AM  Jenna Brown  has presented today for surgery, with the diagnosis of ESR.  The various methods of treatment have been discussed with the patient and family. After consideration of risks, benefits and other options for treatment, the patient has consented to  Procedure(s): ARTERIOVENOUS (AV) FISTULA CREATION vs GRAFT. (Right) as a surgical intervention.  The patient's history has been reviewed, patient examined, no change in status, stable for surgery.  I have reviewed the patient's chart and labs.  Questions were answered to the patient's satisfaction.     Curt Jews

## 2019-03-04 NOTE — Op Note (Signed)
    OPERATIVE REPORT  DATE OF SURGERY: 03/04/2019  PATIENT: Jenna Brown, 68 y.o. female MRN: [id number]  DOB: March 10, 1951  PRE-OPERATIVE DIAGNOSIS: End-stage renal disease  POST-OPERATIVE DIAGNOSIS:  Same  PROCEDURE: Right upper arm AV fistula creation  SURGEON:  Curt Jews, M.D.  PHYSICIAN ASSISTANT: [name], PA-C  ANESTHESIA: MAC  EBL: per anesthesia record  Total I/O In: 400 [I.V.:400] Out: 2021 [Other:2001; Blood:20]  BLOOD ADMINISTERED: none  DRAINS: none  SPECIMEN: none  COUNTS CORRECT:  YES  PATIENT DISPOSITION:  PACU - hemodynamically stable  PROCEDURE DETAILS: Patient was taken up and placed above most where the area the right arm was prepped draped you sterile fashion.  SonoSite ultrasound was used to visualize the veins.  The patient had a good caliber antecubital vein and cephalic vein in the upper arm.  Using local anesthesia incision was made across the antecubital space and carried down to isolate the cephalic vein at the antecubital space which was of good caliber.  Tributary branches were ligated with 3-0 and 4-0 silk ties and divided.  The brachial artery was exposed with the same incision and was of a good caliber.  The vein was ligated distally and was transected and mobilized to the level of the brachial artery.  The artery was occluded proximally distally and was opened 11 blade simultaneous Potts scissors.  The vein was sewn end-to-side to the artery with a running 6-0 Prolene suture.  Clamps were removed excellent thrill was noted.  The wounds irrigated with saline.  Hemostasis electrocautery.  Wounds were closed with 3-0 Vicryl in the subcutaneous and subcuticular tissue.  Sterile dressing was applied and the patient was transferred to the recovery in stable condition with a palpable radial pulse   Jenna Brown, M.D., [facility] 03/04/2019 1:18 PM

## 2019-03-04 NOTE — Anesthesia Postprocedure Evaluation (Signed)
Anesthesia Post Note  Patient: Jenna Brown  Procedure(s) Performed: ARTERIOVENOUS (AV) FISTULA CREATION (Right Arm Upper)     Patient location during evaluation: PACU Anesthesia Type: MAC Level of consciousness: awake and alert Pain management: pain level controlled Vital Signs Assessment: post-procedure vital signs reviewed and stable Respiratory status: spontaneous breathing, nonlabored ventilation and respiratory function stable Cardiovascular status: stable and blood pressure returned to baseline Postop Assessment: no apparent nausea or vomiting Anesthetic complications: no    Last Vitals:  Vitals:   03/04/19 1335 03/04/19 1420  BP: (!) 101/54 96/77  Pulse: 83 80  Resp: 16 19  Temp: 36.9 C   SpO2: 97% 100%    Last Pain:  Vitals:   03/04/19 1420  TempSrc:   PainSc: 0-No pain                 Lynda Rainwater

## 2019-03-04 NOTE — Progress Notes (Signed)
Subjective: Interval History: has no complaint, ready to get aVF.  Marland Kitchen  Objective: Vital signs in last 24 hours: Temp:  [97.7 F (36.5 C)-99.6 F (37.6 C)] 99.6 F (37.6 C) (06/15 0612) Pulse Rate:  [40-84] 81 (06/15 0730) Resp:  [17-22] 22 (06/15 0649) BP: (97-117)/(45-71) 111/60 (06/15 0730) SpO2:  [97 %-100 %] 98 % (06/15 0612) Weight:  [74.7 kg] 74.7 kg (06/15 0612) Weight change: -1.993 kg  Intake/Output from previous day: 06/14 0701 - 06/15 0700 In: 700 [P.O.:700] Out: 150 [Urine:150] Intake/Output this shift: No intake/output data recorded.  General appearance: alert, cooperative, no distress and pale Resp: diminished breath sounds bilaterally Chest wall: IJ PC Cardio: S1, S2 normal and systolic murmur: systolic ejection 2/6, crescendo and decrescendo at 2nd left intercostal space GI: soft, non-tender; bowel sounds normal; no masses,  no organomegaly Extremities: AKA R. 2+ edema  Lab Results: Recent Labs    03/01/19 1813 03/04/19 0558  WBC 7.3 9.0  HGB 7.6* 8.4*  HCT 25.2* 29.0*  PLT 154 144*   BMET:  Recent Labs    03/03/19 0527 03/04/19 0558  NA 139 139  K 3.3* 3.8  CL 104 103  CO2 27 27  GLUCOSE 81 160*  BUN 29* 39*  CREATININE 3.39* 4.41*  CALCIUM 8.3* 8.5*   No results for input(s): PTH in the last 72 hours. Iron Studies: No results for input(s): IRON, TIBC, TRANSFERRIN, FERRITIN in the last 72 hours.  Studies/Results: No results found.  I have reviewed the patient's current medications.  Assessment/Plan: 1 ESRD  On HD. bp limiting vol off, stop Coreg, For access 2 Anemia, esa, Fe 3 HPTH check 4 PVD 5 CAD 6 CVA P perm access, HD, esa, check PTH   LOS: 6 days   Jeneen Rinks Willamina Grieshop 03/04/2019,8:41 AM

## 2019-03-04 NOTE — Anesthesia Procedure Notes (Signed)
Procedure Name: MAC Date/Time: 03/04/2019 11:34 AM Performed by: Marsa Aris, CRNA Pre-anesthesia Checklist: Patient identified, Emergency Drugs available and Suction available Patient Re-evaluated:Patient Re-evaluated prior to induction Oxygen Delivery Method: Simple face mask

## 2019-03-04 NOTE — Transfer of Care (Signed)
Immediate Anesthesia Transfer of Care Note  Patient: Jenna Brown  Procedure(s) Performed: ARTERIOVENOUS (AV) FISTULA CREATION (Right Arm Upper)  Patient Location: PACU  Anesthesia Type:MAC  Level of Consciousness: awake, alert  and oriented  Airway & Oxygen Therapy: Patient Spontanous Breathing  Post-op Assessment: Report given to RN and Post -op Vital signs reviewed and stable  Post vital signs: Reviewed and stable  Last Vitals:  Vitals Value Taken Time  BP    Temp    Pulse 80 03/04/19 1251  Resp    SpO2 93 % 03/04/19 1251  Vitals shown include unvalidated device data.  Last Pain:  Vitals:   03/04/19 1041  TempSrc: (P) Oral  PainSc: (P) 0-No pain         Complications: No apparent anesthesia complications

## 2019-03-05 ENCOUNTER — Encounter (HOSPITAL_COMMUNITY): Payer: Self-pay | Admitting: Vascular Surgery

## 2019-03-05 DIAGNOSIS — I5033 Acute on chronic diastolic (congestive) heart failure: Secondary | ICD-10-CM

## 2019-03-05 LAB — RENAL FUNCTION PANEL
Albumin: 2.7 g/dL — ABNORMAL LOW (ref 3.5–5.0)
Anion gap: 10 (ref 5–15)
BUN: 28 mg/dL — ABNORMAL HIGH (ref 8–23)
CO2: 24 mmol/L (ref 22–32)
Calcium: 8.1 mg/dL — ABNORMAL LOW (ref 8.9–10.3)
Chloride: 103 mmol/L (ref 98–111)
Creatinine, Ser: 3.79 mg/dL — ABNORMAL HIGH (ref 0.44–1.00)
GFR calc Af Amer: 14 mL/min — ABNORMAL LOW (ref >60)
GFR calc non Af Amer: 12 mL/min — ABNORMAL LOW (ref >60)
Glucose, Bld: 177 mg/dL — ABNORMAL HIGH (ref 70–99)
Phosphorus: 3.3 mg/dL (ref 2.5–4.6)
Potassium: 4.9 mmol/L (ref 3.5–5.1)
Sodium: 137 mmol/L (ref 135–145)

## 2019-03-05 LAB — GLUCOSE, CAPILLARY
Glucose-Capillary: 167 mg/dL — ABNORMAL HIGH (ref 70–99)
Glucose-Capillary: 193 mg/dL — ABNORMAL HIGH (ref 70–99)
Glucose-Capillary: 188 mg/dL — ABNORMAL HIGH (ref 70–99)
Glucose-Capillary: 178 mg/dL — ABNORMAL HIGH (ref 70–99)

## 2019-03-05 MED ORDER — BISACODYL 10 MG RE SUPP: 10.0000 mg | Freq: Every day | RECTAL | Status: DC | PRN

## 2019-03-05 MED ORDER — BISACODYL 5 MG PO TBEC
10.0000 mg | DELAYED_RELEASE_TABLET | Freq: Every day | ORAL | Status: DC | PRN
  Administered 2019-03-05: 10 mg via ORAL
  Filled 2019-03-05: qty 2

## 2019-03-05 MED ORDER — CHLORHEXIDINE GLUCONATE CLOTH 2 % EX PADS: 6.0000 | MEDICATED_PAD | Freq: Every day | CUTANEOUS | Status: DC

## 2019-03-05 MED ORDER — POLYETHYLENE GLYCOL 3350 17 G PO PACK
17.0000 g | PACK | Freq: Every day | ORAL | Status: DC | PRN
  Administered 2019-03-05: 17 g via ORAL
  Filled 2019-03-05: qty 1

## 2019-03-05 NOTE — Progress Notes (Signed)
Subjective: Interval History: has complaints , edema better still signif.  Objective: Vital signs in last 24 hours: Temp:  [97.8 F (36.6 C)-99.6 F (37.6 C)] 99.2 F (37.3 C) (06/16 0542) Pulse Rate:  [52-95] 52 (06/16 0854) Resp:  [16-38] 18 (06/16 0542) BP: (92-113)/(46-77) 100/64 (06/16 0854) SpO2:  [94 %-100 %] 97 % (06/16 0854) Weight:  [73 kg-75.7 kg] 73 kg (06/16 0541) Weight change: 0.993 kg  Intake/Output from previous day: 06/15 0701 - 06/16 0700 In: 403 [I.V.:403] Out: 2171 [Urine:150; Blood:20] Intake/Output this shift: Total I/O In: 240 [P.O.:240] Out: -   General appearance: alert, cooperative, no distress and pale Resp: rales bibasilar Chest wall: PC Cardio: S1, S2 normal and systolic murmur: systolic ejection 2/6, crescendo and decrescendo at 2nd left intercostal space GI: soft, non-tender; bowel sounds normal; no masses,  no organomegaly Extremities: RAKA, stump edema,edema LLE  Lab Results: Recent Labs    03/04/19 0558 03/04/19 0955  WBC 9.0  --   HGB 8.4* 10.2*  HCT 29.0* 30.0*  PLT 144*  --    BMET:  Recent Labs    03/04/19 0558 03/04/19 0955 03/05/19 0643  NA 139 139 137  K 3.8 4.0 4.9  CL 103 99 103  CO2 27  --  24  GLUCOSE 160* 99 177*  BUN 39* 14 28*  CREATININE 4.41* 1.90* 3.79*  CALCIUM 8.5*  --  8.1*   No results for input(s): PTH in the last 72 hours. Iron Studies: No results for input(s): IRON, TIBC, TRANSFERRIN, FERRITIN in the last 72 hours.  Studies/Results: No results found.  I have reviewed the patient's current medications.  Assessment/Plan: 1 ESRD for HD in am , Full tx, volxs , lower.  Limited by low bps. 2 Anemia esa, check Fe 3 HPTH needs level 4 PVD 5 CAD 6 CVA 7 CHF P HD, full tx, ok to go to outpatient after HD.    LOS: 7 days   Jeneen Rinks Caleyah Jr 03/05/2019,10:11 AM

## 2019-03-05 NOTE — Progress Notes (Addendum)
  Progress Note    03/05/2019 8:54 AM 1 Day Post-Op  Subjective:  Denies signs or symptoms of steal syndrome   Vitals:   03/05/19 0542 03/05/19 0854  BP: (!) 113/55 100/64  Pulse: 95 (!) 52  Resp: 18   Temp: 99.2 F (37.3 C)   SpO2: 97% 97%   Physical Exam: Lungs:  Non labored Incisions:  R antecubitum incision c/d/i Extremities:  Palpable thrill near incision; palpable R radial Neurologic: A&O  CBC    Component Value Date/Time   WBC 9.0 03/04/2019 0558   RBC 3.17 (L) 03/04/2019 0558   HGB 10.2 (L) 03/04/2019 0955   HCT 30.0 (L) 03/04/2019 0955   HCT 26.8 (L) 02/26/2019 2212   PLT 144 (L) 03/04/2019 0558   MCV 91.5 03/04/2019 0558   MCH 26.5 03/04/2019 0558   MCHC 29.0 (L) 03/04/2019 0558   RDW 15.1 03/04/2019 0558   LYMPHSABS 1.7 02/26/2019 1445   MONOABS 0.7 02/26/2019 1445   EOSABS 0.1 02/26/2019 1445   BASOSABS 0.0 02/26/2019 1445    BMET    Component Value Date/Time   NA 137 03/05/2019 0643   K 4.9 03/05/2019 0643   CL 103 03/05/2019 0643   CO2 24 03/05/2019 0643   GLUCOSE 177 (H) 03/05/2019 0643   BUN 28 (H) 03/05/2019 0643   CREATININE 3.79 (H) 03/05/2019 0643   CALCIUM 8.1 (L) 03/05/2019 0643   GFRNONAA 12 (L) 03/05/2019 0643   GFRAA 14 (L) 03/05/2019 0643    INR    Component Value Date/Time   INR 1.1 03/04/2019 0558     Intake/Output Summary (Last 24 hours) at 03/05/2019 0854 Last data filed at 03/05/2019 0745 Gross per 24 hour  Intake 643 ml  Output 2171 ml  Net -1528 ml     Assessment/Plan:  68 y.o. female is s/p R brachiocephalic fistula 1 Day Post-Op   Patent fistula with palpable thrill No signs or symptoms of steal Office will arrange duplex in 6 weeks Ok for discharge from vascular standpoint   Dagoberto Ligas, PA-C [facility] [phone] 03/05/2019 8:54 AM  I have independently interviewed and examined patient and agree with PA assessment and plan above.    C. Donzetta Matters, MD [facility] [place] Office: [phone] Pager: [phone]

## 2019-03-05 NOTE — Progress Notes (Signed)
Progress Note  Patient Name: Jenna Brown Date of Encounter: 03/05/2019  Primary Cardiologist: Jenna Latch, MD   Subjective   No significant overnight events. Patient reports some mild right sided chest tightness that comes and goes as well as some tightness in right forearm over fistula creation site. Breathing has improvement.  Inpatient Medications    Scheduled Meds: . aspirin  81 mg Oral Daily  . atorvastatin  80 mg Oral Daily  . Chlorhexidine Gluconate Cloth  6 each Topical Q0600  . darbepoetin (ARANESP) injection - DIALYSIS  200 mcg Intravenous Q Mon-HD  . hydrALAZINE  10 mg Oral Q8H  . insulin aspart  0-5 Units Subcutaneous QHS  . insulin aspart  0-9 Units Subcutaneous TID WC  . insulin glargine  20 Units Subcutaneous QHS  . isosorbide mononitrate  15 mg Oral BID  . multivitamin  1 tablet Oral QHS  . mupirocin ointment  1 application Nasal BID  . sodium chloride flush  3 mL Intravenous Q12H   Continuous Infusions: . sodium chloride    . sodium chloride Stopped (03/02/19 1029)  . sodium chloride Stopped (03/04/19 1247)   PRN Meds: sodium chloride, sodium chloride, acetaminophen, ALPRAZolam, benzonatate, nitroGLYCERIN, ondansetron (ZOFRAN) IV, sodium chloride flush, zolpidem   Vital Signs    Vitals:   03/04/19 2155 03/05/19 0541 03/05/19 0542 03/05/19 0854  BP: 110/62  (!) 113/55 100/64  Pulse:   95 (!) 52  Resp:   18   Temp:   99.2 F (37.3 C)   TempSrc:   Oral   SpO2:   97% 97%  Weight:  73 kg    Height:        Intake/Output Summary (Last 24 hours) at 03/05/2019 0855 Last data filed at 03/05/2019 0745 Gross per 24 hour  Intake 643 ml  Output 2171 ml  Net -1528 ml   Filed Weights   03/04/19 1041 03/04/19 1055 03/05/19 0541  Weight: 75.7 kg 74.7 kg 73 kg    Telemetry    Sinus rhythm with rates in the 70's to 90's and multiple PVCs. - Personally Reviewed  ECG    No new ECG tracing today. - Personally Reviewed  Physical Exam   GEN:  African-American female resting comfortably. Alert and in no acute distress.   Neck: Supple.  Cardiac: RRR. No murmurs, gallops, or rubs.  Respiratory: No increased work of breathing. Normal respiratory rate. Diminished breath sounds in bilateral bases but lungs mostly clear. GI: Abdomen soft, non-distended, and non-tender.  Extremities: 2+ pitting edema left lower extremity. Compression stocking in place. S/p right above knee amputation. No deformity.  Skin: Warm and dry. Neuro:  No focal deficits. Psych: Normal affect. Responds appropriately.  Labs    Chemistry Recent Labs  Lab 02/26/19 1445  03/03/19 0527 03/04/19 0558 03/04/19 0955 03/05/19 0643  NA 143   < > 139 139 139 137  K 4.3   < > 3.3* 3.8 4.0 4.9  CL 111   < > 104 103 99 103  CO2 20*   < > 27 27  --  24  GLUCOSE 108*   < > 81 160* 99 177*  BUN 66*   < > 29* 39* 14 28*  CREATININE 5.00*   < > 3.39* 4.41* 1.90* 3.79*  CALCIUM 9.0   < > 8.3* 8.5*  --  8.1*  PROT 6.8  --   --   --   --   --   ALBUMIN 3.4*   < >  2.6* 2.9*  --  2.7*  AST 25  --   --   --   --   --   ALT 20  --   --   --   --   --   ALKPHOS 103  --   --   --   --   --   BILITOT 1.1  --   --   --   --   --   GFRNONAA 8*   < > 14* 10*  --  12*  GFRAA 10*   < > 16* 11*  --  14*  ANIONGAP 12   < > 8 9  --  10   < > = values in this interval not displayed.     Hematology Recent Labs  Lab 02/26/19 2212 03/01/19 1813 03/04/19 0558 03/04/19 0955  WBC 7.3 7.3 9.0  --   RBC 2.95* 2.87* 3.17*  --   HGB 7.9* 7.6* 8.4* 10.2*  HCT 26.1*  26.8* 25.2* 29.0* 30.0*  MCV 88.5 87.8 91.5  --   MCH 26.8 26.5 26.5  --   MCHC 30.3 30.2 29.0*  --   RDW 14.2 14.3 15.1  --   PLT 223 154 144*  --     Cardiac Enzymes Recent Labs  Lab 02/26/19 1445  TROPONINI 0.16*   No results for input(s): TROPIPOC in the last 168 hours.   BNP Recent Labs  Lab 02/26/19 1445  BNP 1,190.5*     DDimer No results for input(s): DDIMER in the last 168 hours.   Radiology     No results found.  Cardiac Studies   Echocardiogram 08/01/2018: Study Conclusions: - Left ventricle: The cavity size was normal. There was mild focal   basal hypertrophy of the septum. Systolic function was mildly   reduced. The estimated ejection fraction was in the range of 45%   to 50%. Hypokinesis of the apicalinferolateral, inferior,   inferoseptal, and apical myocardium. - Aortic valve: Trileaflet; mildly thickened, mildly calcified   leaflets. Left coronary cusp mobility was moderately restricted. - Mitral valve: Calcified annulus. Mildly thickened leaflets .   There was trivial regurgitation. - Right ventricle: The cavity size was mildly dilated. Wall   thickness was normal. Systolic function was low normal. - Atrial septum: No defect or patent foramen ovale was identified. - Tricuspid valve: There was no significant regurgitation. - Pulmonic valve: There was trivial regurgitation.  Impressions: - EF using wall tracking approximately 45%. Hypokinesis of distal   inferior, inferolateral, inferoseptal, and apical walls. Wall   motion visually appears similar to prior. Thickened LCC of aortic   valve appears similar to prior.  Patient Profile   Jenna Brown is a 68 y.o. female with a history of severe CAD and multiple NSTEMIs (most recently in 04/2018) not a candidate for CABG, chronic systolic and diastolic CHF, hypertension, diabetes mellitus, s/p right above-knee amputation who was admitted on 02/26/2019 for chest pain and acute CHF at the request of Dr. Evangeline Brown.  Assessment & Plan    Acute on Chronic Combined CHF  - Patient presented with orthopnea, PND, and edema. - Chest x-ray showed interval development of moderate bilateral pleural effusion with associated atelectasis or edema. - BNP elevated at 1,190.5 - Most recent Echo from 07/2018 showed LVEF of 45% with hypokinesis of distal inferior, inferolateral, inferoseptal, and apical walls.  - Nephrology managing  diuretics due to ESRD. - Documented output of 2 L in the past 24 hours and net  negative 3.9 L since admission. - Coreg was discontinued yesterday due to hypotension. - Patient on Losartan 100mg  daily at home. Held on admission due to progressive CKD and now hypotension.  - Continue to monitor daily weights, strict I/O's, and renal function.  Pleural Effusions - Found to have moderate bilateral pleural effusions on admission. Underwent right thoracentesis on 02/27/2019 with removal of 1.2 L of clear serous fluid. - Diuresis as above.  CAD - EKG shows no acute ischemic changes. - Troponin mildly elevated at 0.16.  - Most recent Echo as above. - Patient does reports some mild right sided chest tightness that comes and goes. - Continue Aspirin and high-intensity statin. - Home beta-blocker due to hypotension. - Currently on low dose Imdur and Hydralazine. Consider discontinuing in order to add back beta-blocker. - Will continue to treat medically at this time.  Hypertension - BP has been soft but stable. Most recent BP 100/64. - Home beta-blocker currently being held.  - Currently on Imdur 15mg  twice daily and Hydralazine 10mg  three times daily.  Hyperlipidemia - Most recent lipid panel from 08/2018: Cholesterol 162, Triglycerides 51, HDL 45, LDL 107. - LDL goal <70 given CAD. - Continue Lipitor 80mg  daily.  ESRD - Patient underwent right upper arm AV fistula creation yesterday. - Management per Nephrology  Otherwise, per primary team:   For questions or updates, please contact [place] Please consult [email or web address] for contact info under Cardiology/STEMI.      Signed, Darreld Mclean, PA-C  03/05/2019, 8:55 AM   Pager: [phone]

## 2019-03-05 NOTE — Progress Notes (Signed)
PROGRESS NOTE    Jenna Brown   QVZ:[id number]  DOB: 12-09-1950  DOA: 02/26/2019 PCP: [name], MD   Brief Narrative:  Jenna Brown  is a 68 y.o. female with medical history significant of coronary artery disease,  congestive heart failure with an ejection fraction of 45 to 50% in November 2019 with associated ischemic cardiomyopathy and hypokinesis, prior stroke, anemia, anxiety, right AKA, stage IV chronic kidney disease, gastroesophageal reflux disease, hypertension, hyperlipidemia, and type 2 diabetes who presents emergency department complaining of worsening dyspnea x3 days. In ED, Cr noted to be 5 from 3.90 on 09/16/18 Troponin 2.38 CXR> moderate pleural effusions Admitted for acute CHF and AKI. Nephrology consulted.   Subjective: She feels her left leg swelling is improving but right stump still feels swollen.  Has not had a BM in 3 days and is asking for medication to help.    Assessment & Plan:   Principal Problem:   Acute on chronic systolic and diastolic congestive heart failure ,      Pleural effusion, bilateral   Acute respiratory failure with hypoxia   AKI on CKD 4-  - ECHO 11/19> 45% to 50%. Hypokinesis of the apicalinferolateral, inferior,   inferoseptal, and apical myocardium. - cardiology consulted- they ordered a thoracentesis on 6/10- Right thora > 1. 2 L  - tunnelled right IJ placed on 6/12 by IR and dialysis started - right upper arm AV fistula created on Monday - remains fluid overloaded- renal feels that she needs more in hospital dialysis  - weaned off of O2 - per renal team, after tomorrow's dialysis, she can go home  Active Problems: Metabolic acidosis - related to above - resolved  Ongoing pedal edema - place TEDS- left leg- elevate legs- volume overload improving with ongoing dialysis   CAD s/p CABG - she stopped her Brillinta about 1 mo ago due to dyspnea - con ASA, Lipitor - due to hypotension, Coreg, Imdur, Hydralazine have  been discontinued  Constipation - added laxatives    Anxiety -cont Xanax PRN    Anemia, likely of chronic disease  - anemia panel reviewed    Insulin-requiring or dependent type II diabetes mellitus  -cont Lantus - start SSI  Right AKA   Time spent in minutes: 35 DVT prophylaxis:  Heparin Code Status: Full code Family Communication:  Disposition Plan: per nephro, d/c after dialysis treatment tomorrow Consultants:   Nephro  Cardiology  Vascular surgery  IR Procedures:   Dialysis cath   Fistula formagion Antimicrobials:  Anti-infectives (From admission, onward)   Start     Dose/Rate Route Frequency Ordered Stop   03/04/19 0600  cefUROXime (ZINACEF) 1.5 g in sodium chloride 0.9 % 100 mL IVPB     1.5 g 200 mL/hr over 30 Minutes Intravenous On call to O.R. 03/03/19 0827 03/04/19 1127   03/01/19 1345  ceFAZolin (ANCEF) IVPB 2g/100 mL premix     2 g 200 mL/hr over 30 Minutes Intravenous  Once 03/01/19 1331 03/01/19 1409       Objective: Vitals:   03/04/19 2155 03/05/19 0541 03/05/19 0542 03/05/19 0854  BP: 110/62  (!) 113/55 100/64  Pulse:   95 (!) 52  Resp:   18   Temp:   99.2 F (37.3 C)   TempSrc:   Oral   SpO2:   97% 97%  Weight:  73 kg    Height:        Intake/Output Summary (Last 24 hours) at 03/05/2019 1231 Last data  filed at 03/05/2019 1225 Gross per 24 hour  Intake 763 ml  Output 150 ml  Net 613 ml   Filed Weights   03/04/19 1041 03/04/19 1055 03/05/19 0541  Weight: 75.7 kg 74.7 kg 73 kg    Examination: General exam: Appears comfortable  HEENT: PERRLA, oral mucosa moist, no sclera icterus or thrush Respiratory system: Clear to auscultation. Respiratory effort normal. Cardiovascular system: S1 & S2 heard,  No murmurs  Gastrointestinal system: Abdomen soft, non-tender, nondistended. Normal bowel sounds   Central nervous system: Alert and oriented. No focal neurological deficits. Extremities: No cyanosis, clubbing or edema- improving  pedal edema Skin: No rashes or ulcers Psychiatry:  Mood & affect appropriate.   Data Reviewed: I have personally reviewed following labs and imaging studies  CBC: Recent Labs  Lab 02/26/19 1445 02/26/19 2212 03/01/19 1813 03/04/19 0558 03/04/19 0955  WBC 8.3 7.3 7.3 9.0  --   NEUTROABS 5.8  --   --   --   --   HGB 8.1* 7.9* 7.6* 8.4* 10.2*  HCT 27.3* 26.1*   26.8* 25.2* 29.0* 30.0*  MCV 88.3 88.5 87.8 91.5  --   PLT 234 223 154 144*  --    Basic Metabolic Panel: Recent Labs  Lab 03/01/19 1813 03/02/19 0503 03/03/19 0527 03/04/19 0558 03/04/19 0955 03/05/19 0643  NA 139 141 139 139 139 137  K 3.6 4.0 3.3* 3.8 4.0 4.9  CL 103 104 104 103 99 103  CO2 24 27 27 27   --  24  GLUCOSE 144* 127* 81 160* 99 177*  BUN 43* 47* 29* 39* 14 28*  CREATININE 3.81* 4.24* 3.39* 4.41* 1.90* 3.79*  CALCIUM 8.2* 8.2* 8.3* 8.5*  --  8.1*  PHOS 3.0 4.5 3.0 3.0  --  3.3   GFR: Estimated Creatinine Clearance: 14.5 mL/min (A) (by C-G formula based on SCr of 3.79 mg/dL (H)). Liver Function Tests: Recent Labs  Lab 02/26/19 1445  03/01/19 1813 03/02/19 0503 03/03/19 0527 03/04/19 0558 03/05/19 0643  AST 25  --   --   --   --   --   --   ALT 20  --   --   --   --   --   --   ALKPHOS 103  --   --   --   --   --   --   BILITOT 1.1  --   --   --   --   --   --   PROT 6.8  --   --   --   --   --   --   ALBUMIN 3.4*   < > 2.8* 2.6* 2.6* 2.9* 2.7*   < > = values in this interval not displayed.   No results for input(s): LIPASE, AMYLASE in the last 168 hours. No results for input(s): AMMONIA in the last 168 hours. Coagulation Profile: Recent Labs  Lab 03/01/19 1150 03/04/19 0558  INR 1.2 1.1   Cardiac Enzymes: Recent Labs  Lab 02/26/19 1445  TROPONINI 0.16*   BNP (last 3 results) No results for input(s): PROBNP in the last 8760 hours. HbA1C: No results for input(s): HGBA1C in the last 72 hours. CBG: Recent Labs  Lab 03/04/19 1446 03/04/19 1616 03/04/19 2141 03/05/19 0543  03/05/19 1137  GLUCAP 98 223* 212* 167* 193*   Lipid Profile: No results for input(s): CHOL, HDL, LDLCALC, TRIG, CHOLHDL, LDLDIRECT in the last 72 hours. Thyroid Function Tests: No results for input(s): TSH,  T4TOTAL, FREET4, T3FREE, THYROIDAB in the last 72 hours. Anemia Panel: No results for input(s): VITAMINB12, FOLATE, FERRITIN, TIBC, IRON, RETICCTPCT in the last 72 hours. Urine analysis:    Component Value Date/Time   COLORURINE YELLOW 09/14/2018 2330   APPEARANCEUR CLOUDY (A) 09/14/2018 2330   LABSPEC 1.014 09/14/2018 2330   PHURINE 7.0 09/14/2018 2330   GLUCOSEU 50 (A) 09/14/2018 2330   HGBUR NEGATIVE 09/14/2018 2330   BILIRUBINUR NEGATIVE 09/14/2018 2330   KETONESUR 5 (A) 09/14/2018 2330   PROTEINUR 100 (A) 09/14/2018 2330   UROBILINOGEN 0.2 04/10/2015 1248   NITRITE NEGATIVE 09/14/2018 2330   LEUKOCYTESUR TRACE (A) 09/14/2018 2330   Sepsis Labs: @LABRCNTIP (procalcitonin:4,lacticidven:4) ) Recent Results (from the past 240 hour(s))  Novel Coronavirus, NAA (hospital order; send-out to ref lab)     Status: None   Collection Time: 02/26/19  5:38 PM   Specimen: Nasopharyngeal Swab; Respiratory  Result Value Ref Range Status   SARS-CoV-2, NAA NOT DETECTED NOT DETECTED Final    Comment: (NOTE) This test was developed and its performance characteristics determined by [employer]. This test has not been FDA cleared or approved. This test has been authorized by FDA under an Emergency Use Authorization (EUA). This test is only authorized for the duration of time the declaration that circumstances exist justifying the authorization of the emergency use of in vitro diagnostic tests for detection of SARS-CoV-2 virus and/or diagnosis of COVID-19 infection under section 564(b)(1) of the Act, 21 U.S.C. 761YWV-3(X)(1), unless the authorization is terminated or revoked sooner. When diagnostic testing is negative, the possibility of a false negative result should be  considered in the context of a patient's recent exposures and the presence of clinical signs and symptoms consistent with COVID-19. An individual without symptoms of COVID-19 and who is not shedding SARS-CoV-2 virus would expect to have a negative (not detected) result in this assay. Performed  At: [facility] [address] [place], [place] [id number] Rush Farmer MD OE:[id number]    Ginger Blue  Final    Comment: Performed at [facility], [place] [address], [place], [place] [address]  Surgical PCR screen     Status: None   Collection Time: 03/04/19  5:09 AM   Specimen: Nasal Mucosa; Nasal Swab  Result Value Ref Range Status   MRSA, PCR NEGATIVE NEGATIVE Final   Staphylococcus aureus NEGATIVE NEGATIVE Final    Comment: (NOTE) The Xpert SA Assay (FDA approved for NASAL specimens in patients 48 years of age and older), is one component of a comprehensive surveillance program. It is not intended to diagnose infection nor to guide or monitor treatment. Performed at [facility], [place] [address], [place], [place] [address]          Radiology Studies: No results found.    Scheduled Meds:  aspirin  81 mg Oral Daily   atorvastatin  80 mg Oral Daily   Chlorhexidine Gluconate Cloth  6 each Topical Q0600   darbepoetin (ARANESP) injection - DIALYSIS  200 mcg Intravenous Q Mon-HD   insulin aspart  0-5 Units Subcutaneous QHS   insulin aspart  0-9 Units Subcutaneous TID WC   insulin glargine  20 Units Subcutaneous QHS   multivitamin  1 tablet Oral QHS   mupirocin ointment  1 application Nasal BID   sodium chloride flush  3 mL Intravenous Q12H   Continuous Infusions:  sodium chloride     sodium chloride Stopped (03/02/19 1029)   sodium chloride Stopped (03/04/19 1247)  LOS: 7 days      Debbe Odea, MD [facility] Pager: [email or web address] Password [facility] 03/05/2019, 12:31 PM

## 2019-03-05 NOTE — Care Management Important Message (Signed)
Important Message  Patient Details  Name: MADELLINE ESHBACH MRN: [id number] Date of Birth: 06/18/1951   Medicare Important Message Given:  Yes    Shelda Altes 03/05/2019, 12:42 PM

## 2019-03-06 LAB — IRON AND TIBC
Iron: 33 ug/dL (ref 28–170)
Saturation Ratios: 12 % (ref 10.4–31.8)
TIBC: 276 ug/dL (ref 250–450)
UIBC: 243 ug/dL

## 2019-03-06 LAB — RENAL FUNCTION PANEL
Albumin: 2.7 g/dL — ABNORMAL LOW (ref 3.5–5.0)
Anion gap: 11 (ref 5–15)
BUN: 42 mg/dL — ABNORMAL HIGH (ref 8–23)
CO2: 23 mmol/L (ref 22–32)
Calcium: 8.4 mg/dL — ABNORMAL LOW (ref 8.9–10.3)
Chloride: 101 mmol/L (ref 98–111)
Creatinine, Ser: 4.87 mg/dL — ABNORMAL HIGH (ref 0.44–1.00)
GFR calc Af Amer: 10 mL/min — ABNORMAL LOW (ref >60)
GFR calc non Af Amer: 9 mL/min — ABNORMAL LOW (ref >60)
Glucose, Bld: 124 mg/dL — ABNORMAL HIGH (ref 70–99)
Phosphorus: 3.4 mg/dL (ref 2.5–4.6)
Potassium: 4.6 mmol/L (ref 3.5–5.1)
Sodium: 135 mmol/L (ref 135–145)

## 2019-03-06 LAB — CBC
HCT: 28.3 % — ABNORMAL LOW (ref 36.0–46.0)
Hemoglobin: 8.3 g/dL — ABNORMAL LOW (ref 12.0–15.0)
MCH: 27.2 pg (ref 26.0–34.0)
MCHC: 29.3 g/dL — ABNORMAL LOW (ref 30.0–36.0)
MCV: 92.8 fL (ref 80.0–100.0)
Platelets: 133 10*3/uL — ABNORMAL LOW (ref 150–400)
RBC: 3.05 MIL/uL — ABNORMAL LOW (ref 3.87–5.11)
RDW: 16.1 % — ABNORMAL HIGH (ref 11.5–15.5)
WBC: 17.8 10*3/uL — ABNORMAL HIGH (ref 4.0–10.5)
nRBC: 0.7 % — ABNORMAL HIGH (ref 0.0–0.2)

## 2019-03-06 LAB — GLUCOSE, CAPILLARY
Glucose-Capillary: 120 mg/dL — ABNORMAL HIGH (ref 70–99)
Glucose-Capillary: 116 mg/dL — ABNORMAL HIGH (ref 70–99)

## 2019-03-06 MED ORDER — HEPARIN SODIUM (PORCINE) 1000 UNIT/ML IJ SOLN
INTRAMUSCULAR | Status: AC
  Administered 2019-03-06: 1000 [IU]
  Filled 2019-03-06: qty 4

## 2019-03-06 MED ORDER — SODIUM CHLORIDE 0.9 % IV SOLN
125.0000 mg | INTRAVENOUS | Status: DC
  Filled 2019-03-06: qty 10

## 2019-03-06 MED ORDER — INSULIN GLARGINE 100 UNIT/ML ~~LOC~~ SOLN: 20.0000 [IU] | Freq: Every day | SUBCUTANEOUS | 11 refills | Status: DC

## 2019-03-06 MED ORDER — BISACODYL 5 MG PO TBEC: 10.0000 mg | DELAYED_RELEASE_TABLET | Freq: Every day | ORAL | 0 refills | Status: DC | PRN

## 2019-03-06 MED ORDER — POLYETHYLENE GLYCOL 3350 17 G PO PACK: 17.0000 g | PACK | Freq: Every day | ORAL | 0 refills | Status: DC | PRN

## 2019-03-06 MED ORDER — RENA-VITE PO TABS: 1.0000 | ORAL_TABLET | Freq: Every day | ORAL | 2 refills | Status: DC

## 2019-03-06 NOTE — Procedures (Signed)
I was present at this session.  I have reviewed the session itself and made appropriate changes.  HD via PC. bp in low 100s, vol xs, see how tol vol off.  To go home, discussed with PA  Jeneen Rinks [place]acker[place] Sara 6/17/20209:04 AM

## 2019-03-06 NOTE — Discharge Summary (Signed)
Physician Discharge Summary  Jenna Brown PJK:[id number] DOB: 03/12/1951 DOA: 02/26/2019  PCP: [name], MD  Admit date: 02/26/2019 Discharge date: 03/06/2019  Admitted From: home Disposition: home  Recommendations for Outpatient Follow-up:  1. Follow up with PCP in 1-2 weeks 2. Please obtain BMP/CBC in one week 3. Please follow up for dialysis at [facility]  Home Health:yes Equipment/Devices none CODE STATUS full Diet recommendation: cardiac renal  Brief/Interim Summary:68 y.o.femalewith medical history significant ofcoronary artery disease,  congestive heart failure with an ejection fraction of 45 to 50% in November 2019 with associated ischemic cardiomyopathy and hypokinesis, prior stroke, anemia, anxiety, right AKA, stage IV chronic kidney disease, gastroesophageal reflux disease, hypertension, hyperlipidemia, and type 2 diabetes who presents emergency department complaining of worsening dyspnea x3 days. In ED, Cr noted to be 5 from 3.90 on 09/16/18 Troponin 2.38 CXR> moderate pleural effusions Admitted for acute CHF and AKI. Nephrology consulted.   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 Hypokinesis of the apicalinferolateral, inferior, inferoseptal, and apical myocardium.  Patient seen in consultation by cardiology who ordered a right thoracentesis drained 1.2 L of fluid. - AKI on CKD stage IV - tunnelled right IJ placed on 6/12 by IR and dialysis started - right upper arm AV fistula placed 03/04/2019 -Patient will be discharged home today to continue dialysis at [facility].  Metabolic acidosis - related to above - resolved  Ongoing pedal edema left lower extremity continue TED hose keep feet elevated while in bed   CAD s/p CABG - she stopped her Brillinta about 1 mo ago due to dyspnea - con ASA, Lipitor - due to hypotension, Coreg, Imdur, Hydralazine have been discontinued  Constipation - added laxatives    Anxiety -cont Xanax PRN  Anemia, likely of chronic disease hemoglobin stable 8.3 on discharge.     Insulin-requiring or dependent type II diabetes mellitus  -cont Lantus nightly.  Right AKA still with some edema.  Hopefully edema will improve with ongoing dialysis in the long term.  Estimated body mass index is 28.1 kg/m as calculated from the following:   Height as of this encounter: 5' 4.02" (1.626 m).   Weight as of this encounter: 74.3 kg.  Discharge Instructions  Discharge Instructions    Call MD for:  difficulty breathing, headache or visual disturbances   Complete by: As directed    Call MD for:  persistant nausea and vomiting   Complete by: As directed    Call MD for:  severe uncontrolled pain   Complete by: As directed    Call MD for:  temperature >100.4   Complete by: As directed  Diet - low sodium heart healthy   Complete by: As directed    Increase activity slowly   Complete by: As directed      Allergies as of 03/06/2019      Reactions   Plavix [clopidogrel Bisulfate] Itching   Codeine Other (See Comments)   "makes me feel strange"   Tylenol  With Codeine #3 [acetaminophen-codeine] Other (See Comments)   "doesn't make me feel right"      Medication List    STOP taking these medications   amLODipine 10 MG tablet Commonly known as: NORVASC   doxazosin 1 MG tablet Commonly known as: CARDURA   furosemide 40 MG tablet Commonly known as: LASIX   hydrALAZINE 100 MG tablet Commonly known as: APRESOLINE   isosorbide mononitrate 120 MG 24 hr tablet Commonly known as: IMDUR   losartan 100 MG tablet Commonly known as: COZAAR     TAKE these medications   acetaminophen 500 MG tablet Commonly known as: TYLENOL Take 500 mg by mouth every 6 (six) hours as needed for mild pain.   ALPRAZolam 0.25 MG tablet Commonly known as: XANAX Take 1 tablet (0.25 mg total) by mouth 3 (three) times daily as needed for anxiety. What changed: when to take this   aspirin 81 MG chewable tablet Chew 81 mg by mouth daily.   atorvastatin 80 MG tablet Commonly known as: LIPITOR Take 80 mg by mouth daily.   bisacodyl 5 MG EC tablet Commonly known as: DULCOLAX Take 2 tablets (10 mg total) by mouth daily as needed for moderate constipation.   ferrous sulfate 325 (65 FE) MG tablet Take 957 mg by mouth daily with breakfast.   insulin glargine 100 UNIT/ML injection Commonly known as: LANTUS Inject 0.2 mLs (20 Units total) into the skin at bedtime.   multivitamin Tabs tablet Take 1 tablet by mouth at bedtime.   nitroGLYCERIN 0.4 MG SL tablet Commonly known as: NITROSTAT Place 1 tablet (0.4 mg total) under the tongue every 5 (five) minutes x 3 doses as needed for chest pain.   polyethylene glycol 17 g packet Commonly known as: MIRALAX / GLYCOLAX Take 17 g by mouth daily as needed for mild constipation.   sodium bicarbonate 650 MG tablet Take 1,300 mg by mouth 2 (two) times daily.   sodium polystyrene 15 GM/60ML suspension Commonly known as: KAYEXALATE Take 7.5 mg by mouth once.   VITAMIN D-3 PO Take 4,000 Units by mouth daily.       Follow-up Information    Early, Arvilla Meres, MD In 6 weeks.   Specialties: Vascular Surgery, Cardiology Why: Office will call you to arrange your appt (sent) Contact information: [place] [address] [phone]        [name], MD Follow up.   Specialty: Internal Medicine Contact information: [address] [address] [phone]        Bensimhon, Shaune Pascal, MD .   Specialty: Cardiology Contact information: [place] [address] [phone]        Skeet Latch, MD .   Specialty: Cardiology Contact information: [address] [place] [address] [phone]          Allergies  Allergen Reactions  . Plavix [Clopidogrel Bisulfate] Itching  . Codeine Other (See Comments)    "makes me feel strange"  . Tylenol With Codeine #3 [Acetaminophen-Codeine] Other (See Comments)    "doesn't make me feel right"    Consultations: Nephrology and  cardiology  Procedures/Studies: Ir Fluoro Guide Cv Line Right  Result Date: 03/01/2019 INDICATION: END-STAGE RENAL DISEASE EXAM: ULTRASOUND GUIDANCE FOR VASCULAR ACCESS RIGHT INTERNAL JUGULAR PERMANENT HEMODIALYSIS CATHETER Date:  03/01/2019 03/01/2019 2:11 pm Radiologist:  Jerilynn Mages. Daryll Brod, MD Guidance:  Ultrasound fluoroscopic FLUOROSCOPY TIME:  Fluoroscopy Time: 0 minutes 54 seconds (5 mGy). MEDICATIONS: Ancef 2 g within 1 hour of the procedure ANESTHESIA/SEDATION: Versed 1 mg IV; Fentanyl 25 mcg IV; Moderate Sedation Time:  15 minutes The patient was continuously monitored during the procedure by the interventional radiology nurse under my direct supervision. CONTRAST:  None. COMPLICATIONS: None immediate. PROCEDURE: Informed consent was obtained from the patient following explanation of the procedure, risks, benefits and alternatives. The patient understands, agrees and consents for the procedure. All questions were addressed. A time out was  performed. Maximal barrier sterile technique utilized including caps, mask, sterile gowns, sterile gloves, large sterile drape, hand hygiene, and 2% chlorhexidine scrub. Under sterile conditions and local anesthesia, right internal jugular micropuncture venous access was performed with ultrasound. Images were obtained for documentation of the patent right internal jugular vein. A guide wire was inserted followed by a transitional dilator. Next, a 0.035 guidewire was advanced into the IVC with a 5-French catheter. Measurements were obtained from the right venotomy site to the proximal right atrium. In the right infraclavicular chest, a subcutaneous tunnel was created under sterile conditions and local anesthesia. 1% lidocaine with epinephrine was utilized for this. The 19 cm tip to cuff dialysis catheter was tunneled subcutaneously to the venotomy site and inserted into the SVC/RA junction through a valved peel-away sheath. Position was confirmed with fluoroscopy. Images were obtained for documentation. Blood was aspirated from the catheter followed by saline and heparin flushes. The appropriate volume and strength of heparin was instilled in each lumen. Caps were applied. The catheter was secured at the tunnel site with Gelfoam and a pursestring suture. The venotomy site was closed with subcuticular Vicryl suture. Dermabond was applied to the small right neck incision. A dry sterile dressing was applied. The catheter is ready for use. No immediate complications. IMPRESSION: Ultrasound and fluoroscopically guided right internal jugular tunneled hemodialysis catheter (19 cm tip to cuff dialysis catheter). Electronically Signed   By: Jerilynn Mages.  Shick M.D.   On: 03/01/2019 14:20   Ir US Guide Vasc Access Right  Result Date: 03/01/2019 INDICATION: END-STAGE RENAL DISEASE EXAM: ULTRASOUND GUIDANCE FOR VASCULAR ACCESS RIGHT INTERNAL JUGULAR PERMANENT HEMODIALYSIS CATHETER Date:  03/01/2019 03/01/2019 2:11 pm Radiologist:  Jerilynn Mages.  Daryll Brod, MD Guidance:  Ultrasound fluoroscopic FLUOROSCOPY TIME:  Fluoroscopy Time: 0 minutes 54 seconds (5 mGy). MEDICATIONS: Ancef 2 g within 1 hour of the procedure ANESTHESIA/SEDATION: Versed 1 mg IV; Fentanyl 25 mcg IV; Moderate Sedation Time:  15 minutes The patient was continuously monitored during the procedure by the interventional radiology nurse under my direct supervision. CONTRAST:  None. COMPLICATIONS: None immediate. PROCEDURE: Informed consent was obtained from the patient following explanation of the procedure, risks, benefits and alternatives. The patient understands, agrees and consents for the procedure. All questions were addressed. A time out was performed. Maximal barrier sterile technique utilized including caps, mask, sterile gowns, sterile gloves, large sterile drape, hand hygiene, and 2% chlorhexidine scrub. Under sterile conditions and local anesthesia, right internal jugular micropuncture venous access was performed with ultrasound. Images were obtained for documentation of the patent right internal jugular vein. A guide wire was inserted followed by a transitional dilator. Next, a 0.035 guidewire was advanced into the IVC with a  5-French catheter. Measurements were obtained from the right venotomy site to the proximal right atrium. In the right infraclavicular chest, a subcutaneous tunnel was created under sterile conditions and local anesthesia. 1% lidocaine with epinephrine was utilized for this. The 19 cm tip to cuff dialysis catheter was tunneled subcutaneously to the venotomy site and inserted into the SVC/RA junction through a valved peel-away sheath. Position was confirmed with fluoroscopy. Images were obtained for documentation. Blood was aspirated from the catheter followed by saline and heparin flushes. The appropriate volume and strength of heparin was instilled in each lumen. Caps were applied. The catheter was secured at the tunnel site with Gelfoam and a pursestring  suture. The venotomy site was closed with subcuticular Vicryl suture. Dermabond was applied to the small right neck incision. A dry sterile dressing was applied. The catheter is ready for use. No immediate complications. IMPRESSION: Ultrasound and fluoroscopically guided right internal jugular tunneled hemodialysis catheter (19 cm tip to cuff dialysis catheter). Electronically Signed   By: Jerilynn Mages.  Shick M.D.   On: 03/01/2019 14:20   Dg Chest Port 1 View  Result Date: 02/27/2019 CLINICAL DATA:  Pleural effusion, history of coronary artery disease post MI, GERD, hypertension EXAM: PORTABLE CHEST 1 VIEW COMPARISON:  Portable exam 1609 hours compared to 02/26/2019 FINDINGS: Enlargement of cardiac silhouette with slight vascular congestion. Mediastinal contours normal. Bibasilar atelectasis and small LEFT pleural effusion. Decrease in RIGHT basilar opacity since previous exam. Question minimal perihilar edema. No pneumothorax. IMPRESSION: Enlargement of cardiac silhouette with question minimal pulmonary edema. Persistent small LEFT pleural effusion with bibasilar opacities, favor atelectasis. Electronically Signed   By: Lavonia Dana M.D.   On: 02/27/2019 16:35   Dg Chest Portable 1 View  Result Date: 02/26/2019 CLINICAL DATA:  Hypoxia, acute dyspnea. EXAM: PORTABLE CHEST 1 VIEW COMPARISON:  Radiograph of September 14, 2018. FINDINGS: Stable cardiomediastinal silhouette. Moderate bilateral pleural effusions are noted with associated atelectasis or edema. No pneumothorax is noted. Bony thorax is unremarkable. IMPRESSION: Interval development of moderate pleural effusions with associated atelectasis or edema. Electronically Signed   By: Marijo Conception M.D.   On: 02/26/2019 14:56   Ir US Chest  Result Date: 02/28/2019 CLINICAL DATA:  Assess for pleural effusions EXAM: ULTRASOUND OF CHEST SOFT TISSUES TECHNIQUE: Ultrasound examination of the chest wall soft tissues was performed in the area of clinical concern.  COMPARISON:  02/27/2019 FINDINGS: Ultrasound of the posterior chest performed with the patient upright. No significant right pleural effusion. Small non loculated left pleural effusion. IMPRESSION: Small non loculated left pleural effusion. Recommend repeat chest x-ray tomorrow and if further progression reconsider thoracentesis. Electronically Signed   By: Jerilynn Mages.  Shick M.D.   On: 02/28/2019 16:53   Vas [place] Lower Extremity Venous (dvt) (mc And Wl 7a-7p)  Result Date: 02/26/2019  Lower Venous Study Indications: Edema.  Risk Factors: CHF. Limitations: Poor ultrasound/tissue interface. Comparison Study: No recent studies. Performing Technologist: Oliver Hum RVT  Examination Guidelines: A complete evaluation includes B-mode imaging, spectral Doppler, color Doppler, and power Doppler as needed of all accessible portions of each vessel. Bilateral testing is considered an integral part of a complete examination. Limited examinations for reoccurring indications may be performed as noted.  +-----+---------------+---------+-----------+----------+-------+ RIGHTCompressibilityPhasicitySpontaneityPropertiesSummary +-----+---------------+---------+-----------+----------+-------+ CFV  Full           Yes      Yes                          +-----+---------------+---------+-----------+----------+-------+   +---------+---------------+---------+-----------+----------+-------+  LEFT     CompressibilityPhasicitySpontaneityPropertiesSummary +---------+---------------+---------+-----------+----------+-------+ CFV      Full           Yes      Yes                          +---------+---------------+---------+-----------+----------+-------+ SFJ      Full                                                 +---------+---------------+---------+-----------+----------+-------+ FV Prox  Full                                                 +---------+---------------+---------+-----------+----------+-------+  FV Mid   Full                                                 +---------+---------------+---------+-----------+----------+-------+ FV DistalFull                                                 +---------+---------------+---------+-----------+----------+-------+ PFV      Full                                                 +---------+---------------+---------+-----------+----------+-------+ POP      Full           Yes      Yes                          +---------+---------------+---------+-----------+----------+-------+ PTV      Full                                                 +---------+---------------+---------+-----------+----------+-------+ PERO     Full                                                 +---------+---------------+---------+-----------+----------+-------+     Summary: Right: No evidence of common femoral vein obstruction. Left: There is no evidence of deep vein thrombosis in the lower extremity. No cystic structure found in the popliteal fossa.  *See table(s) above for measurements and observations. Electronically signed by Harold Barban MD on 02/26/2019 at 52:19:35 PM.    Final    Vas [place] Upper Ext Vein Mapping (pre-op Avf)  Result Date: 02/28/2019 UPPER EXTREMITY VEIN MAPPING  Indications: Pre-access. Performing Technologist: Oliver Hum RVT  Examination Guidelines: A complete evaluation includes B-mode imaging, spectral Doppler, color Doppler, and power Doppler as needed of all accessible portions of each vessel. Bilateral testing  is considered an integral part of a complete examination. Limited examinations for reoccurring indications may be performed as noted. +-----------------+-------------+----------+----------------------------------+ Right Cephalic   Diameter (cm)Depth (cm)             Findings              +-----------------+-------------+----------+----------------------------------+ Shoulder             0.41        1.15                                       +-----------------+-------------+----------+----------------------------------+ Prox upper arm       0.34        1.15                                      +-----------------+-------------+----------+----------------------------------+ Mid upper arm        0.38        1.03               branching              +-----------------+-------------+----------+----------------------------------+ Dist upper arm       0.37        0.74                                      +-----------------+-------------+----------+----------------------------------+ Antecubital fossa    0.44        0.61                                      +-----------------+-------------+----------+----------------------------------+ Prox forearm                            not visualized and IV and bandages +-----------------+-------------+----------+----------------------------------+ Mid forearm          0.18        0.43                                      +-----------------+-------------+----------+----------------------------------+ Dist forearm         0.26        0.20               branching              +-----------------+-------------+----------+----------------------------------+ +-----------------+-------------+----------+---------+ Right Basilic    Diameter (cm)Depth (cm)Findings  +-----------------+-------------+----------+---------+ Shoulder             0.46        0.88             +-----------------+-------------+----------+---------+ Mid upper arm        0.46        1.02             +-----------------+-------------+----------+---------+ Dist upper arm       0.23        1.08   branching +-----------------+-------------+----------+---------+ Antecubital fossa    0.18        0.31             +-----------------+-------------+----------+---------+ Prox forearm  0.22        0.53             +-----------------+-------------+----------+---------+ Mid  forearm          0.19        0.24             +-----------------+-------------+----------+---------+ Distal forearm       0.19        0.23             +-----------------+-------------+----------+---------+ +-----------------+-------------+----------+---------+ Left Cephalic    Diameter (cm)Depth (cm)Findings  +-----------------+-------------+----------+---------+ Shoulder             0.46        1.07             +-----------------+-------------+----------+---------+ Prox upper arm       0.22        0.95             +-----------------+-------------+----------+---------+ Mid upper arm        0.14        0.85   branching +-----------------+-------------+----------+---------+ Dist upper arm       0.13        0.76             +-----------------+-------------+----------+---------+ Antecubital fossa    0.15        0.54             +-----------------+-------------+----------+---------+ Prox forearm         0.12        0.34   branching +-----------------+-------------+----------+---------+ Mid forearm          0.13        0.25             +-----------------+-------------+----------+---------+ Dist forearm         0.08        0.25             +-----------------+-------------+----------+---------+ +-----------------+-------------+----------+---------+ Left Basilic     Diameter (cm)Depth (cm)Findings  +-----------------+-------------+----------+---------+ Shoulder             0.24        1.00             +-----------------+-------------+----------+---------+ Mid upper arm        0.24        0.90             +-----------------+-------------+----------+---------+ Dist upper arm       0.20        0.70             +-----------------+-------------+----------+---------+ Antecubital fossa    0.12        0.64   branching +-----------------+-------------+----------+---------+ Prox forearm         0.15        0.55              +-----------------+-------------+----------+---------+ Mid forearm          0.16        0.16             +-----------------+-------------+----------+---------+ Distal forearm       0.12        0.21             +-----------------+-------------+----------+---------+ *See table(s) above for measurements and observations.   Diagnosing physician: Servando Snare MD Electronically signed by Servando Snare MD on 02/28/2019 at 5:30:34 PM.    Final     (Echo, Carotid, EGD, Colonoscopy, ERCP)    Subjective:  Patient  seen at dialysis anxious to go home received stool softeners and laxatives so she feels like she pooped all night she lives at home with her mother and her sister and she says she takes care of herself. Discharge Exam: Vitals:   03/06/19 0730 03/06/19 0800  BP: 111/60 109/60  Pulse: 79 79  Resp:    Temp:    SpO2:     Vitals:   03/06/19 0650 03/06/19 0700 03/06/19 0730 03/06/19 0800  BP: 109/65 106/63 111/60 109/60  Pulse: 82 68 79 79  Resp:      Temp:      TempSrc:      SpO2:      Weight:      Height:        General: Pt is alert, awake, not in acute distress Cardiovascular: RRR, S1/S2 +, no rubs, no gallops Respiratory: CTA bilaterally, no wheezing, no rhonchi Abdominal: Soft, NT, ND, bowel sounds + Extremities: 3+ pitting edema to the left lower extremity TED hose on right stump with edema   The results of significant diagnostics from this hospitalization (including imaging, microbiology, ancillary and laboratory) are listed below for reference.     Microbiology: Recent Results (from the past 240 hour(s))  Novel Coronavirus, NAA (hospital order; send-out to ref lab)     Status: None   Collection Time: 02/26/19  5:38 PM   Specimen: Nasopharyngeal Swab; Respiratory  Result Value Ref Range Status   SARS-CoV-2, NAA NOT DETECTED NOT DETECTED Final    Comment: (NOTE) This test was developed and its performance characteristics determined by [employer]. This  test has not been FDA cleared or approved. This test has been authorized by FDA under an Emergency Use Authorization (EUA). This test is only authorized for the duration of time the declaration that circumstances exist justifying the authorization of the emergency use of in vitro diagnostic tests for detection of SARS-CoV-2 virus and/or diagnosis of COVID-19 infection under section 564(b)(1) of the Act, 21 U.S.C. 989QJJ-9(E)(1), unless the authorization is terminated or revoked sooner. When diagnostic testing is negative, the possibility of a false negative result should be considered in the context of a patient's recent exposures and the presence of clinical signs and symptoms consistent with COVID-19. An individual without symptoms of COVID-19 and who is not shedding SARS-CoV-2 virus would expect to have a negative (not detected) result in this assay. Performed  At: [facility] [address] [place], [place] [id number] Rush Farmer MD EH:[id number]    [place]  Final    Comment: Performed at [facility], [place] [address], [place], [place] [id number]  Surgical PCR screen     Status: None   Collection Time: 03/04/19  5:09 AM   Specimen: Nasal Mucosa; Nasal Swab  Result Value Ref Range Status   MRSA, PCR NEGATIVE NEGATIVE Final   Staphylococcus aureus NEGATIVE NEGATIVE Final    Comment: (NOTE) The Xpert SA Assay (FDA approved for NASAL specimens in patients 14 years of age and older), is one component of a comprehensive surveillance program. It is not intended to diagnose infection nor to guide or monitor treatment. Performed at [facility], [place] [address] [place], [place] [address]      Labs: BNP (last 3 results) Recent Labs    04/20/18 0112 09/15/18 0115 02/26/19 1445  BNP 443.9* 131.0* 4,128.7*   Basic Metabolic Panel: Recent Labs  Lab 03/02/19 0503 03/03/19 8676 03/04/19 7209 03/04/19 4709 03/05/19 6283  03/06/19 6629  NA 141 139 139 139 137 135  K 4.0 3.3* 3.8 4.0 4.9 4.6  CL 104 104 103 99 103 101  CO2 27 27 27   --  24 23  GLUCOSE 127* 81 160* 99 177* 124*  BUN 47* 29* 39* 14 28* 42*  CREATININE 4.24* 3.39* 4.41* 1.90* 3.79* 4.87*  CALCIUM 8.2* 8.3* 8.5*  --  8.1* 8.4*  PHOS 4.5 3.0 3.0  --  3.3 3.4   Liver Function Tests: Recent Labs  Lab 03/02/19 0503 03/03/19 0527 03/04/19 0558 03/05/19 0643 03/06/19 0706  ALBUMIN 2.6* 2.6* 2.9* 2.7* 2.7*   No results for input(s): LIPASE, AMYLASE in the last 168 hours. No results for input(s): AMMONIA in the last 168 hours. CBC: Recent Labs  Lab 03/01/19 1813 03/04/19 0558 03/04/19 0955 03/06/19 0705  WBC 7.3 9.0  --  17.8*  HGB 7.6* 8.4* 10.2* 8.3*  HCT 25.2* 29.0* 30.0* 28.3*  MCV 87.8 91.5  --  92.8  PLT 154 144*  --  133*   Cardiac Enzymes: No results for input(s): CKTOTAL, CKMB, CKMBINDEX, TROPONINI in the last 168 hours. BNP: Invalid input(s): POCBNP CBG: Recent Labs  Lab 03/05/19 0543 03/05/19 1137 03/05/19 1610 03/05/19 2122 03/06/19 0600  GLUCAP 167* 193* 188* 178* 120*   D-Dimer No results for input(s): DDIMER in the last 72 hours. Hgb A1c No results for input(s): HGBA1C in the last 72 hours. Lipid Profile No results for input(s): CHOL, HDL, LDLCALC, TRIG, CHOLHDL, LDLDIRECT in the last 72 hours. Thyroid function studies No results for input(s): TSH, T4TOTAL, T3FREE, THYROIDAB in the last 72 hours.  Invalid input(s): FREET3 Anemia work up Recent Labs    03/06/19 0705  TIBC 276  IRON 33   Urinalysis    Component Value Date/Time   COLORURINE YELLOW 09/14/2018 2330   APPEARANCEUR CLOUDY (A) 09/14/2018 2330   LABSPEC 1.014 09/14/2018 2330   PHURINE 7.0 09/14/2018 2330   GLUCOSEU 50 (A) 09/14/2018 2330   HGBUR NEGATIVE 09/14/2018 2330   BILIRUBINUR NEGATIVE 09/14/2018 2330   KETONESUR 5 (A) 09/14/2018 2330   PROTEINUR 100 (A) 09/14/2018 2330   UROBILINOGEN 0.2 04/10/2015 1248   NITRITE  NEGATIVE 09/14/2018 2330   LEUKOCYTESUR TRACE (A) 09/14/2018 2330   Sepsis Labs Invalid input(s): PROCALCITONIN,  WBC,  LACTICIDVEN Microbiology Recent Results (from the past 240 hour(s))  Novel Coronavirus, NAA (hospital order; send-out to ref lab)     Status: None   Collection Time: 02/26/19  5:38 PM   Specimen: Nasopharyngeal Swab; Respiratory  Result Value Ref Range Status   SARS-CoV-2, NAA NOT DETECTED NOT DETECTED Final    Comment: (NOTE) This test was developed and its performance characteristics determined by [employer]. This test has not been FDA cleared or approved. This test has been authorized by FDA under an Emergency Use Authorization (EUA). This test is only authorized for the duration of time the declaration that circumstances exist justifying the authorization of the emergency use of in vitro diagnostic tests for detection of SARS-CoV-2 virus and/or diagnosis of COVID-19 infection under section 564(b)(1) of the Act, 21 U.S.C. 330QTM-2(U)(6), unless the authorization is terminated or revoked sooner. When diagnostic testing is negative, the possibility of a false negative result should be considered in the context of a patient's recent exposures and the presence of clinical signs and symptoms consistent with COVID-19. An individual without symptoms of COVID-19 and who is not shedding SARS-CoV-2 virus would expect to have a negative (not detected) result in this assay. Performed  At: [facility] [address] [place], [place] [id number] Rush Farmer MD VC:[id number]    Coronavirus Source NASOPHARYNGEAL  Final    Comment: Performed at [facility], [place] [address] [place], [place] [address]  Surgical PCR screen     Status: None   Collection Time: 03/04/19  5:09 AM   Specimen: Nasal Mucosa; Nasal Swab  Result Value Ref Range Status   MRSA, PCR NEGATIVE NEGATIVE Final   Staphylococcus aureus NEGATIVE NEGATIVE Final    Comment:  (NOTE) The Xpert SA Assay (FDA approved for NASAL specimens in patients 30 years of age and older), is one component of a comprehensive surveillance program. It is not intended to diagnose infection nor to guide or monitor treatment. Performed at [facility], [place] [address] [place], [place] [address]      Time coordinating discharge: 34 minutes  SIGNED:   Georgette Shell, MD  [facility] 03/06/2019, 9:24 AM Pager   If 7PM-7AM, please contact night-coverage [email or web address] Password [email or web address]

## 2019-03-06 NOTE — Progress Notes (Signed)
Progress Note  Patient Name: Jenna Brown Date of Encounter: 03/06/2019  Primary Cardiologist: Skeet Latch, MD   Subjective   No significant overnight events. Feels like swelling has improved No CP & breathing better  Inpatient Medications    Scheduled Meds: . aspirin  81 mg Oral Daily  . atorvastatin  80 mg Oral Daily  . Chlorhexidine Gluconate Cloth  6 each Topical Q0600  . darbepoetin (ARANESP) injection - DIALYSIS  200 mcg Intravenous Q Mon-HD  . insulin aspart  0-5 Units Subcutaneous QHS  . insulin aspart  0-9 Units Subcutaneous TID WC  . insulin glargine  20 Units Subcutaneous QHS  . multivitamin  1 tablet Oral QHS  . mupirocin ointment  1 application Nasal BID  . sodium chloride flush  3 mL Intravenous Q12H   Continuous Infusions: . sodium chloride    . sodium chloride Stopped (03/02/19 1029)  . sodium chloride Stopped (03/04/19 1247)  . ferric gluconate (FERRLECIT/NULECIT) IV     PRN Meds: sodium chloride, sodium chloride, acetaminophen, ALPRAZolam, benzonatate, bisacodyl, bisacodyl, nitroGLYCERIN, ondansetron (ZOFRAN) IV, polyethylene glycol, sodium chloride flush, zolpidem   Vital Signs    Vitals:   03/06/19 0650 03/06/19 0700 03/06/19 0730 03/06/19 0800  BP: 109/65 106/63 111/60 109/60  Pulse: 82 68 79 79  Resp:      Temp:      TempSrc:      SpO2:      Weight:      Height:        Intake/Output Summary (Last 24 hours) at 03/06/2019 0857 Last data filed at 03/05/2019 2226 Gross per 24 hour  Intake 603 ml  Output 2 ml  Net 601 ml   Filed Weights   03/05/19 0541 03/06/19 0457 03/06/19 0645  Weight: 73 kg 67.8 kg 74.3 kg    Telemetry    NSR - Personally Reviewed  ECG    No new ECG tracing today. - Personally Reviewed  Physical Exam    GEN: African-American female resting comfortably on HD. NAD.   Neck: Supple.  Cardiac: RRR. Distant, but normal S1 S2, no M/R/G - can hear soft R sided bruit from fistula. Respiratory: non-labored.  Normal resp rate. GI: sot/ NT/ND/NABS. No HSM Extremities: still has ~12+ LLE edema (Compression stocking in place). S/p right above knee amputation - stump less edematous Skin: warm/dry, no lesions Neuro:  Nonfocal Psych: somewhat flat affect.  Slow, but appropriate response.  Labs    Chemistry Recent Labs  Lab 03/04/19 0558 03/04/19 0955 03/05/19 0643 03/06/19 0706  NA 139 139 137 135  K 3.8 4.0 4.9 4.6  CL 103 99 103 101  CO2 27  --  24 23  GLUCOSE 160* 99 177* 124*  BUN 39* 14 28* 42*  CREATININE 4.41* 1.90* 3.79* 4.87*  CALCIUM 8.5*  --  8.1* 8.4*  ALBUMIN 2.9*  --  2.7* 2.7*  GFRNONAA 10*  --  12* 9*  GFRAA 11*  --  14* 10*  ANIONGAP 9  --  10 11     Hematology Recent Labs  Lab 03/01/19 1813 03/04/19 0558 03/04/19 0955 03/06/19 0705  WBC 7.3 9.0  --  17.8*  RBC 2.87* 3.17*  --  3.05*  HGB 7.6* 8.4* 10.2* 8.3*  HCT 25.2* 29.0* 30.0* 28.3*  MCV 87.8 91.5  --  92.8  MCH 26.5 26.5  --  27.2  MCHC 30.2 29.0*  --  29.3*  RDW 14.3 15.1  --  16.1*  PLT  154 144*  --  133*    Cardiac Enzymes No results for input(s): TROPONINI in the last 168 hours. No results for input(s): TROPIPOC in the last 168 hours.   BNP No results for input(s): BNP, PROBNP in the last 168 hours.   DDimer No results for input(s): DDIMER in the last 168 hours.   Radiology    No results found.  Cardiac Studies   Echocardiogram 08/01/2018: Study Conclusions: - Left ventricle: The cavity size was normal. There was mild focal   basal hypertrophy of the septum. Systolic function was mildly   reduced. The estimated ejection fraction was in the range of 45%   to 50%. Hypokinesis of the apicalinferolateral, inferior,   inferoseptal, and apical myocardium. - Aortic valve: Trileaflet; mildly thickened, mildly calcified   leaflets. Left coronary cusp mobility was moderately restricted. - Mitral valve: Calcified annulus. Mildly thickened leaflets .   There was trivial regurgitation. -  Right ventricle: The cavity size was mildly dilated. Wall   thickness was normal. Systolic function was low normal. - Atrial septum: No defect or patent foramen ovale was identified. - Tricuspid valve: There was no significant regurgitation. - Pulmonic valve: There was trivial regurgitation.  Impressions: - EF using wall tracking approximately 45%. Hypokinesis of distal   inferior, inferolateral, inferoseptal, and apical walls. Wall   motion visually appears similar to prior. Thickened LCC of aortic   valve appears similar to prior.  Patient Profile   Ms. Caya is a 68 y.o. female with a history of severe CAD and multiple NSTEMIs (most recently in 04/2018) not a candidate for CABG, chronic systolic and diastolic CHF, hypertension, diabetes mellitus, s/p right above-knee amputation who was admitted on 02/26/2019 for chest pain and acute CHF at the request of Dr. Evangeline Gula.  Assessment & Plan    Acute on Chronic Combined CHF  - Patient presented with orthopnea, PND, and edema. - Chest x-ray showed interval development of moderate bilateral pleural effusion with associated atelectasis or edema. - BNP elevated at 1,190.5 - Most recent Echo from 07/2018 showed LVEF of 45% with hypokinesis of distal inferior, inferolateral, inferoseptal, and apical walls.  - Nephrology managing volume status with hemodialysis. Net negative 3.3 L this admission. - Coreg was discontinued yesterday due to hypotension. - Patient on Losartan 100mg  daily at home. Held on admission due to progressive CKD and now hypotension.  - Continue to monitor daily weights, strict I/O's, and renal function.  Pleural Effusions - Found to have moderate bilateral pleural effusions on admission. Underwent right thoracentesis on 02/27/2019 with removal of 1.2 L of clear serous fluid. - Diuresis as above.  CAD - EKG shows no acute ischemic changes. - Troponin mildly elevated at 0.16.  - Most recent Echo as above. - Patient does  reports some mild right sided chest tightness that comes and goes. - Continue Aspirin and high-intensity statin. - Home beta-blocker held due to hypotension. Imdur and Hydralazine discontinued yesterday as well. Consider restarting low dose beta-blocker if BP allows. - Will continue to treat medically at this time.  Hypertension - BP has been soft but stable. Most recent BP 109/60 - Home anti-hypertensive being held. - Consider adding back low dose beta-blocker if able.   Hyperlipidemia - Most recent lipid panel from 08/2018: Cholesterol 162, Triglycerides 51, HDL 45, LDL 107. - LDL goal <70 given CAD. - Continue Lipitor 80mg  daily.  ESRD on Hemodialysis - Patient underwent right upper arm AV fistula creation on 03/04/2019.  - Management per  Nephrology  Otherwise, per primary team:   For questions or updates, please contact [place] Please consult [email or web address] for contact info under Cardiology/STEMI.      Signed, Darreld Mclean, PA-C  03/06/2019, 8:57 AM   Pager: [phone]   ATTENDING ATTESTATION  I have seen, examined and evaluated the patient this AM along with Sande Rives, PA-C.  After reviewing all the available data and chart, we discussed the patients laboratory, study & physical findings as well as symptoms in detail. I agree with her findings, examination as well as impression recommendations as per our discussion.    Attending adjustments noted in italics.   Feeling better & volume level improving on HD --> BP is now limiting Rx. - no BP room for additional afterload reduction  [facility] will sign off.   Medication Recommendations:  Agree with trying to restart low dose Beta Blocker prior to d/c or shortly in f/u as part of CAD management. Other recommendations (labs, testing, etc):  n/a Follow up as an outpatient:  Will arrange f/u with Dr. Blenda Mounts APP (she is out on maternity leave).     Glenetta Hew, M.D., M.S. Interventional  Cardiologist   Pager # [phone] Phone # [phone] [address] [place], [place] [address]

## 2019-03-06 NOTE — Progress Notes (Signed)
Renal Navigator notes plan for discharge today and notified OP HD clinic[facility] for plan for new start in OP HD clinic tomorrow, 03/07/19.  Renal Navigator called patient's sister Ivin Booty [phone], whom Renal Navigator spoke with previously, with this update.  Alphonzo Cruise, [place] Renal Navigator [phone]

## 2019-03-06 NOTE — Progress Notes (Addendum)
Pt currently in inpatient dialysis.Plan to discharge today on room air and home heath and out patient Dialysis.

## 2019-03-06 NOTE — Progress Notes (Signed)
Subjective: Interval History: has complaints still swelling.  Objective: Vital signs in last 24 hours: Temp:  [98.1 F (36.7 C)-98.7 F (37.1 C)] 98.7 F (37.1 C) (06/17 0645) Pulse Rate:  [47-90] 79 (06/17 0800) Resp:  [18] 18 (06/17 0645) BP: (101-124)/(57-72) 109/60 (06/17 0800) SpO2:  [95 %-98 %] 95 % (06/17 0645) Weight:  [67.8 kg-74.3 kg] 74.3 kg (06/17 0645) Weight change: -7.887 kg  Intake/Output from previous day: 06/16 0701 - 06/17 0700 In: 843 [P.O.:840; I.V.:3] Out: 2 [Urine:1; Stool:1] Intake/Output this shift: No intake/output data recorded.  General appearance: alert, cooperative, no distress and pale Resp: rales bibasilar Chest wall: PC on R Cardio: S1, S2 normal and systolic murmur: systolic ejection 2/6, crescendo and decrescendo at 2nd left intercostal space GI: soft, pos bs, liver down 5 cm Extremities: edema 3+ LLE and RAKA stump,  and AVF RUA  Lab Results: Recent Labs    03/04/19 0558 03/04/19 0955 03/06/19 0705  WBC 9.0  --  17.8*  HGB 8.4* 10.2* 8.3*  HCT 29.0* 30.0* 28.3*  PLT 144*  --  133*   BMET:  Recent Labs    03/05/19 0643 03/06/19 0706  NA 137 135  K 4.9 4.6  CL 103 101  CO2 24 23  GLUCOSE 177* 124*  BUN 28* 42*  CREATININE 3.79* 4.87*  CALCIUM 8.1* 8.4*   No results for input(s): PTH in the last 72 hours. Iron Studies:  Recent Labs    03/06/19 0705  IRON 33  TIBC 276    Studies/Results: No results found.  I have reviewed the patient's current medications.  Assessment/Plan: 1 ESRD on HD, vol xs. Full tx . To be d/c. Serial lower dry 2 Anemia esa. Needs Fe 3 DM controlled 4 PVD 5 CAD 6 Hx CVA 7HPTH vit D P HD, lower vol, solute, esa. Start Fe    LOS: 8 days   Jeneen Rinks Micheala Morissette 03/06/2019,9:05 AM

## 2019-03-07 DIAGNOSIS — D631 Anemia in chronic kidney disease: Secondary | ICD-10-CM | POA: Diagnosis not present

## 2019-03-07 DIAGNOSIS — N186 End stage renal disease: Secondary | ICD-10-CM | POA: Diagnosis not present

## 2019-03-07 DIAGNOSIS — E1121 Type 2 diabetes mellitus with diabetic nephropathy: Secondary | ICD-10-CM | POA: Diagnosis not present

## 2019-03-07 DIAGNOSIS — N2581 Secondary hyperparathyroidism of renal origin: Secondary | ICD-10-CM | POA: Diagnosis not present

## 2019-03-07 DIAGNOSIS — E785 Hyperlipidemia, unspecified: Secondary | ICD-10-CM | POA: Diagnosis not present

## 2019-03-07 LAB — PTH, INTACT AND CALCIUM
Calcium, Total (PTH): 8.4 mg/dL — ABNORMAL LOW (ref 8.7–10.3)
PTH: 309 pg/mL — ABNORMAL HIGH (ref 15–65)

## 2019-03-08 ENCOUNTER — Telehealth: Payer: Self-pay | Admitting: Adult Health

## 2019-03-08 NOTE — Telephone Encounter (Signed)
call sister's smartphone/ consent/ my chart declined/ my chart/ pre reg completed

## 2019-03-09 DIAGNOSIS — D631 Anemia in chronic kidney disease: Secondary | ICD-10-CM | POA: Diagnosis not present

## 2019-03-09 DIAGNOSIS — E785 Hyperlipidemia, unspecified: Secondary | ICD-10-CM | POA: Diagnosis not present

## 2019-03-09 DIAGNOSIS — N2581 Secondary hyperparathyroidism of renal origin: Secondary | ICD-10-CM | POA: Diagnosis not present

## 2019-03-09 DIAGNOSIS — N186 End stage renal disease: Secondary | ICD-10-CM | POA: Diagnosis not present

## 2019-03-09 DIAGNOSIS — E1121 Type 2 diabetes mellitus with diabetic nephropathy: Secondary | ICD-10-CM | POA: Diagnosis not present

## 2019-03-11 DIAGNOSIS — E1121 Type 2 diabetes mellitus with diabetic nephropathy: Secondary | ICD-10-CM | POA: Diagnosis not present

## 2019-03-11 DIAGNOSIS — E785 Hyperlipidemia, unspecified: Secondary | ICD-10-CM | POA: Diagnosis not present

## 2019-03-11 DIAGNOSIS — D631 Anemia in chronic kidney disease: Secondary | ICD-10-CM | POA: Diagnosis not present

## 2019-03-11 DIAGNOSIS — N186 End stage renal disease: Secondary | ICD-10-CM | POA: Diagnosis not present

## 2019-03-11 DIAGNOSIS — N2581 Secondary hyperparathyroidism of renal origin: Secondary | ICD-10-CM | POA: Diagnosis not present

## 2019-03-13 DIAGNOSIS — E1121 Type 2 diabetes mellitus with diabetic nephropathy: Secondary | ICD-10-CM | POA: Diagnosis not present

## 2019-03-13 DIAGNOSIS — E785 Hyperlipidemia, unspecified: Secondary | ICD-10-CM | POA: Diagnosis not present

## 2019-03-13 DIAGNOSIS — N186 End stage renal disease: Secondary | ICD-10-CM | POA: Diagnosis not present

## 2019-03-13 DIAGNOSIS — N2581 Secondary hyperparathyroidism of renal origin: Secondary | ICD-10-CM | POA: Diagnosis not present

## 2019-03-13 DIAGNOSIS — D631 Anemia in chronic kidney disease: Secondary | ICD-10-CM | POA: Diagnosis not present

## 2019-03-13 NOTE — Telephone Encounter (Signed)
I called  pt to confirm her appointment with [name] on 03-14-19.

## 2019-03-13 NOTE — Progress Notes (Signed)
Virtual Visit via Telephone Note   This visit type was conducted due to national recommendations for restrictions regarding the COVID-19 Pandemic (e.g. social distancing) in an effort to limit this patient's exposure and mitigate transmission in our community.  Due to her co-morbid illnesses, this patient is at least at moderate risk for complications without adequate follow up.  This format is felt to be most appropriate for this patient at this time.  The patient did not have access to video technology/had technical difficulties with video requiring transitioning to audio format only (telephone).  All issues noted in this document were discussed and addressed.  No physical exam could be performed with this format.  Please refer to the patient's chart for her  consent to telehealth for [facility].   Date:  03/14/2019   ID:  Jenna Brown, DOB 11-19-1950, MRN [id number]  Patient Location: Home Provider Location: Home  PCP:  [name], MD  Cardiologist:  Skeet Latch, MD  Electrophysiologist:  None   Evaluation Performed:  Follow-Up Visit  Chief Complaint: Posthospitalization follow-up  History of Present Illness:    Jenna Brown is a 68 y.o. female with known history of severe CAD with multiple non-ST elevation MI, not a candidate for CABG, chronic systolic and diastolic heart failure, hypertension, ESRD on hemodialysis, diabetes, status post AKA of the right, who was seen during recent hospitalization on 02/26/2019 for chest pain and acute CHF decompensation.  Most recent echocardiogram revealed EF of 45% with hypokinesis of distal inferior, inferior lateral, inferior septal, and apical walls.  The patient was dialyzed with removal of 3.3 L during admission.  Her carvedilol was discontinued due to hypotension.  She was also found to have moderate bilateral pleural effusions on admission and underwent right thoracentesis on 02/27/2019 with removal of 1.2 L of clear serous  fluid.  The patient was continued on aspirin and Lipitor but due to hypotension Coreg Imdur and hydralazine have been discontinued.  The patient had a right upper arm AV fistula placed on 03/04/2019 and is to continue dialysis at [facility] dialysis center.  She was discharged on 03/06/2019. MW`F.   She is accompanied by her mother and her sister as I am speaking with her on the phone.  She is denying any complaints of shortness of breath chest discomfort or edema.  She is going to dialysis on Monday Wednesdays and Fridays and is tolerating it well.  She is medically compliant.  She does not have any concerns today except for refills on alprazolam.   The patient  have symptoms concerning for COVID-19 infection (fever, chills, cough, or new shortness of breath).    Past Medical History:  Diagnosis Date   Anemia    Anxiety    takes Xanax prn   Arthritis    CKD (chronic kidney disease), stage IV (HCC)    Coronary artery disease 2016   cath w/ 90% LAD, 95%CFX, 80% OM 2, 60% RCA, not CABG candidate, rx medically   GERD (gastroesophageal reflux disease)    H/O hiatal hernia    Headache(784.0)    when b/p is elevated   Hemorrhoids    Hx of AKA (above knee amputation), right (HCC)    Hyperlipidemia    takes Simvastatin daily   Hypertension    takes Amlodipine/HCTZ/Losartan daily   Migraines    Myocardial infarction ([place]) 1990's   NSTEMI (non-ST elevated myocardial infarction) ([place])    Peripheral vascular disease (HCC)    PONV (postoperative nausea  and vomiting)    Stroke ([place])    TIA history   Type II diabetes mellitus ([place])    on Lantus   Vertigo    takes Ativert prn   Past Surgical History:  Procedure Laterality Date   ABDOMINAL AORTAGRAM N/A 02/29/2012   Procedure: ABDOMINAL AORTAGRAM;  Surgeon: [name], MD;  Location: [facility] CATH LAB;  Service: Cardiovascular;  Laterality: N/A;   AMPUTATION  05/10/2012   Procedure: AMPUTATION ABOVE KNEE;  Surgeon: [name], MD;  Location: [facility] OR;  Service: Vascular;  Laterality: Right;   open right groin wound noted   aortogram  02/2012   AV FISTULA PLACEMENT Right 03/04/2019   Procedure: ARTERIOVENOUS (AV) FISTULA CREATION;  Surgeon: Rosetta Posner, MD;  Location: [place];  Service: Vascular;  Laterality: Right;   CARDIAC CATHETERIZATION N/A 04/13/2015   Procedure: Right/Left Heart Cath and Coronary Angiography;  Surgeon: [name], MD;  Location: [place] CV LAB;  Service: Cardiovascular;  Laterality: N/A;   CLEFT PALATE REPAIR     several   COLONOSCOPY     DILATION AND CURETTAGE OF UTERUS     FEMORAL-POPLITEAL BYPASS GRAFT  04/05/2012   Procedure: BYPASS GRAFT FEMORAL-POPLITEAL ARTERY;  Surgeon: [name], MD;  Location: [place];  Service: Vascular;  Laterality: Right;  REVISION   FEMORAL-TIBIAL BYPASS GRAFT  03/29/2012   Procedure: BYPASS GRAFT FEMORAL-TIBIAL ARTERY;  Surgeon: [name], MD;  Location: [facility] OR;  Service: Vascular;  Laterality: Right;  Right femoral to Posterior Tibialis with composite graft of 44mm x 80 cm ringed gortex graft and saphenous vein  ,intraoperative arteriogram   FEMOROPOPLITEAL THROMBECTOMY / EMBOLECTOMY  05/09/2012   IR FLUORO GUIDE CV LINE RIGHT  03/01/2019   IR US GUIDE VASC ACCESS RIGHT  03/01/2019   MYOMECTOMY     PR VEIN BYPASS GRAFT,AORTO-FEM-POP  05/27/11   Right SFA-Below knee Pop BP   TEE WITHOUT CARDIOVERSION N/A 01/03/2017   Procedure: TRANSESOPHAGEAL ECHOCARDIOGRAM (TEE);  Surgeon: Skeet Latch, MD;  Location: [place];  Service: Cardiovascular;  Laterality: N/A;   THORACENTESIS  02/27/2019       TONSILLECTOMY     TUBAL LIGATION  ~ 1990     Current Meds  Medication Sig   acetaminophen (TYLENOL) 500 MG tablet Take 500 mg by mouth every 6 (six) hours as needed for mild pain.   ALPRAZolam (XANAX) 0.25 MG tablet Take 1 tablet (0.25 mg total) by mouth 3 (three) times daily as needed for anxiety. (Patient taking differently:  Take 0.25 mg by mouth daily as needed for anxiety. )   aspirin 81 MG chewable tablet Chew 81 mg by mouth daily.   atorvastatin (LIPITOR) 80 MG tablet Take 80 mg by mouth daily.    bisacodyl (DULCOLAX) 5 MG EC tablet Take 2 tablets (10 mg total) by mouth daily as needed for moderate constipation.   Cholecalciferol (VITAMIN D-3 PO) Take 4,000 Units by mouth daily.    ferrous sulfate 325 (65 FE) MG tablet Take 957 mg by mouth daily with breakfast.    insulin glargine (LANTUS) 100 UNIT/ML injection Inject 0.2 mLs (20 Units total) into the skin at bedtime.   multivitamin (RENA-VIT) TABS tablet Take 1 tablet by mouth at bedtime.   nitroGLYCERIN (NITROSTAT) 0.4 MG SL tablet Place 1 tablet (0.4 mg total) under the tongue every 5 (five) minutes x 3 doses as needed for chest pain.   polyethylene glycol (MIRALAX / GLYCOLAX) 17 g packet Take 17  g by mouth daily as needed for mild constipation.   sodium bicarbonate 650 MG tablet Take 1,300 mg by mouth 2 (two) times daily.   sodium polystyrene (KAYEXALATE) 15 GM/60ML suspension Take 7.5 mg by mouth once.     Allergies:   Plavix [clopidogrel bisulfate], Codeine, and Tylenol with codeine #3 [acetaminophen-codeine]   Social History   Tobacco Use   Smoking status: Former Smoker    Packs/day: 0.25    Years: 40.00    Pack years: 10.00    Types: Cigarettes    Quit date: 02/27/2012    Years since quitting: 7.0   Smokeless tobacco: Never Used  Substance Use Topics   Alcohol use: No   Drug use: No     Family Hx: The patient's family history includes Cancer in her brother and mother; Diabetes in her father; Heart attack in her maternal grandfather and maternal grandmother.  ROS:   Please see the history of present illness.    All other systems reviewed and are negative.   Prior CV studies:   The following studies were reviewed today: Echocardiogram 08/01/2018 Left ventricle: The cavity size was normal. There was mild focal   basal  hypertrophy of the septum. Systolic function was mildly   reduced. The estimated ejection fraction was in the range of 45%   to 50%. Hypokinesis of the apicalinferolateral, inferior,   inferoseptal, and apical myocardium. - Aortic valve: Trileaflet; mildly thickened, mildly calcified   leaflets. Left coronary cusp mobility was moderately restricted. - Mitral valve: Calcified annulus. Mildly thickened leaflets .   There was trivial regurgitation. - Right ventricle: The cavity size was mildly dilated. Wall   thickness was normal. Systolic function was low normal. - Atrial septum: No defect or patent foramen ovale was identified. - Tricuspid valve: There was no significant regurgitation. - Pulmonic valve: There was trivial regurgitation.  Labs/Other Tests and Data Reviewed:    EKG:  No ECG reviewed.  Recent Labs: 02/26/2019: ALT 20; B Natriuretic Peptide 1,190.5 03/06/2019: BUN 42; Creatinine, Ser 4.87; Hemoglobin 8.3; Platelets 133; Potassium 4.6; Sodium 135   Recent Lipid Panel Lab Results  Component Value Date/Time   CHOL 162 09/15/2018 05:22 AM   TRIG 51 09/15/2018 05:22 AM   HDL 45 09/15/2018 05:22 AM   CHOLHDL 3.6 09/15/2018 05:22 AM   LDLCALC 107 (H) 09/15/2018 05:22 AM    Wt Readings from Last 3 Encounters:  03/14/19 155 lb (70.3 kg)  03/06/19 158 lb 11.7 oz (72 kg)  11/16/18 154 lb 12.8 oz (70.2 kg)     Objective:    Vital Signs:  BP 122/64    Pulse 70    Ht 5' 4.2" (1.631 m)    Wt 155 lb (70.3 kg)    BMI 26.44 kg/m    VITAL SIGNS:  reviewed GEN:  no acute distress NEURO:  alert and oriented x 3, no obvious focal deficit PSYCH:  normal affect  ASSESSMENT & PLAN:    1.  Acute on chronic mixed heart failure: Fluid volume is now managed by dialysis which began while she was recently hospitalized.  The patient is tolerating dialysis well and does not have evidence of volume overload according to her family members with no swelling shortness of breath or chest  pressure.  Due to hypotension she was taken off of carvedilol.  No changes in her regimen at this time.  2.  Pleural effusion: Required thoracentesis on the right lower lobe on 02/27/2019 with removal of 1.2 L  of fluid.  She denies pain, trouble breathing, or coughing at this time.  3.  Coronary artery disease: Not a candidate for CABG and will need medical management only.  4  End-stage renal disease: Followed by [facility] dialysis center with dialysis on Mondays Wednesdays Fridays.  She is compliant with this regimen and is not having any untoward effects resulting from this.  Continue regimen.  COVID-19 Education: The signs and symptoms of COVID-19 were discussed with the patient and how to seek care for testing (follow up with PCP or arrange E-visit).  The importance of social distancing was discussed today.  Time:   Today, I have spent 15 minutes with the patient with telehealth technology discussing the above problems.     Medication Adjustments/Labs and Tests Ordered: Current medicines are reviewed at length with the patient today.  Concerns regarding medicines are outlined above.   Tests Ordered: No orders of the defined types were placed in this encounter.   Medication Changes: No orders of the defined types were placed in this encounter.   Disposition:  Follow up 3 to 4 months  Signed, Phill Myron. [place] Pugh, ANP, AACC  03/14/2019 9:02 AM    [facility]

## 2019-03-14 ENCOUNTER — Telehealth (INDEPENDENT_AMBULATORY_CARE_PROVIDER_SITE_OTHER): Payer: Medicare Other | Admitting: Adult Health

## 2019-03-14 ENCOUNTER — Encounter: Payer: Self-pay | Admitting: Adult Health

## 2019-03-14 VITALS — BP 122/64 | HR 70 | Ht 64.2 in | Wt 155.0 lb

## 2019-03-14 DIAGNOSIS — D638 Anemia in other chronic diseases classified elsewhere: Secondary | ICD-10-CM | POA: Diagnosis not present

## 2019-03-14 DIAGNOSIS — I5032 Chronic diastolic (congestive) heart failure: Secondary | ICD-10-CM | POA: Diagnosis not present

## 2019-03-14 DIAGNOSIS — R1013 Epigastric pain: Secondary | ICD-10-CM | POA: Diagnosis not present

## 2019-03-14 DIAGNOSIS — I2510 Atherosclerotic heart disease of native coronary artery without angina pectoris: Secondary | ICD-10-CM | POA: Diagnosis not present

## 2019-03-14 DIAGNOSIS — E538 Deficiency of other specified B group vitamins: Secondary | ICD-10-CM | POA: Diagnosis not present

## 2019-03-14 DIAGNOSIS — E119 Type 2 diabetes mellitus without complications: Secondary | ICD-10-CM | POA: Diagnosis not present

## 2019-03-14 DIAGNOSIS — I5022 Chronic systolic (congestive) heart failure: Secondary | ICD-10-CM

## 2019-03-14 DIAGNOSIS — N186 End stage renal disease: Secondary | ICD-10-CM

## 2019-03-14 DIAGNOSIS — Z992 Dependence on renal dialysis: Secondary | ICD-10-CM

## 2019-03-14 DIAGNOSIS — I10 Essential (primary) hypertension: Secondary | ICD-10-CM | POA: Diagnosis not present

## 2019-03-14 DIAGNOSIS — I161 Hypertensive emergency: Secondary | ICD-10-CM

## 2019-03-14 DIAGNOSIS — I119 Hypertensive heart disease without heart failure: Secondary | ICD-10-CM | POA: Diagnosis not present

## 2019-03-14 DIAGNOSIS — Z89611 Acquired absence of right leg above knee: Secondary | ICD-10-CM | POA: Diagnosis not present

## 2019-03-14 DIAGNOSIS — N184 Chronic kidney disease, stage 4 (severe): Secondary | ICD-10-CM | POA: Diagnosis not present

## 2019-03-14 DIAGNOSIS — E559 Vitamin D deficiency, unspecified: Secondary | ICD-10-CM | POA: Diagnosis not present

## 2019-03-14 NOTE — Patient Instructions (Addendum)
Follow-Up: You will need a follow up appointment in 3 months-TELEPHONE VISIT ON 05/30/2019 @ 2:00 PM [place], MD  [name], DNP, AACC or one of the following Advanced Practice Providers on your designated Care Team:  Kerin Ransom, [place]  Roby Lofts, PA-C  Sande Rives, [place]       Medication Instructions:  The current medical regimen is effective;  continue present plan and medications as directed. Please refer to the Current Medication list given to you today. If you need a refill on your cardiac medications before your next appointment, please call your pharmacy. Labwork: When you have labs (blood work) and your tests are completely normal, you will receive your results ONLY by [place] (if you have MyChart) -OR- A paper copy in the mail.  At [facility], you and your health needs are our priority.  As part of our continuing mission to provide you with exceptional heart care, we have created designated Provider Care Teams.  These Care Teams include your primary Cardiologist (physician) and Advanced Practice Providers (APPs -  Physician Assistants and Nurse Practitioners) who all work together to provide you with the care you need, when you need it.  Thank you for choosing [facility] at [facility]!!

## 2019-03-15 ENCOUNTER — Encounter (HOSPITAL_COMMUNITY): Payer: Medicare Other

## 2019-03-15 DIAGNOSIS — E1121 Type 2 diabetes mellitus with diabetic nephropathy: Secondary | ICD-10-CM | POA: Diagnosis not present

## 2019-03-15 DIAGNOSIS — N186 End stage renal disease: Secondary | ICD-10-CM | POA: Diagnosis not present

## 2019-03-15 DIAGNOSIS — E785 Hyperlipidemia, unspecified: Secondary | ICD-10-CM | POA: Diagnosis not present

## 2019-03-15 DIAGNOSIS — D631 Anemia in chronic kidney disease: Secondary | ICD-10-CM | POA: Diagnosis not present

## 2019-03-15 DIAGNOSIS — N2581 Secondary hyperparathyroidism of renal origin: Secondary | ICD-10-CM | POA: Diagnosis not present

## 2019-03-18 DIAGNOSIS — D631 Anemia in chronic kidney disease: Secondary | ICD-10-CM | POA: Diagnosis not present

## 2019-03-18 DIAGNOSIS — E785 Hyperlipidemia, unspecified: Secondary | ICD-10-CM | POA: Diagnosis not present

## 2019-03-18 DIAGNOSIS — N2581 Secondary hyperparathyroidism of renal origin: Secondary | ICD-10-CM | POA: Diagnosis not present

## 2019-03-18 DIAGNOSIS — N186 End stage renal disease: Secondary | ICD-10-CM | POA: Diagnosis not present

## 2019-03-18 DIAGNOSIS — E1121 Type 2 diabetes mellitus with diabetic nephropathy: Secondary | ICD-10-CM | POA: Diagnosis not present

## 2019-03-19 DIAGNOSIS — N186 End stage renal disease: Secondary | ICD-10-CM | POA: Diagnosis not present

## 2019-03-19 DIAGNOSIS — Z992 Dependence on renal dialysis: Secondary | ICD-10-CM | POA: Diagnosis not present

## 2019-03-20 DIAGNOSIS — N186 End stage renal disease: Secondary | ICD-10-CM | POA: Diagnosis not present

## 2019-03-20 DIAGNOSIS — N2581 Secondary hyperparathyroidism of renal origin: Secondary | ICD-10-CM | POA: Diagnosis not present

## 2019-03-20 DIAGNOSIS — D509 Iron deficiency anemia, unspecified: Secondary | ICD-10-CM | POA: Diagnosis not present

## 2019-03-20 DIAGNOSIS — E785 Hyperlipidemia, unspecified: Secondary | ICD-10-CM | POA: Diagnosis not present

## 2019-03-20 DIAGNOSIS — D631 Anemia in chronic kidney disease: Secondary | ICD-10-CM | POA: Diagnosis not present

## 2019-03-20 DIAGNOSIS — E1121 Type 2 diabetes mellitus with diabetic nephropathy: Secondary | ICD-10-CM | POA: Diagnosis not present

## 2019-03-22 DIAGNOSIS — D509 Iron deficiency anemia, unspecified: Secondary | ICD-10-CM | POA: Diagnosis not present

## 2019-03-22 DIAGNOSIS — D631 Anemia in chronic kidney disease: Secondary | ICD-10-CM | POA: Diagnosis not present

## 2019-03-22 DIAGNOSIS — N186 End stage renal disease: Secondary | ICD-10-CM | POA: Diagnosis not present

## 2019-03-22 DIAGNOSIS — E785 Hyperlipidemia, unspecified: Secondary | ICD-10-CM | POA: Diagnosis not present

## 2019-03-22 DIAGNOSIS — N2581 Secondary hyperparathyroidism of renal origin: Secondary | ICD-10-CM | POA: Diagnosis not present

## 2019-03-22 DIAGNOSIS — E1121 Type 2 diabetes mellitus with diabetic nephropathy: Secondary | ICD-10-CM | POA: Diagnosis not present

## 2019-03-25 DIAGNOSIS — N186 End stage renal disease: Secondary | ICD-10-CM | POA: Diagnosis not present

## 2019-03-25 DIAGNOSIS — E785 Hyperlipidemia, unspecified: Secondary | ICD-10-CM | POA: Diagnosis not present

## 2019-03-25 DIAGNOSIS — D509 Iron deficiency anemia, unspecified: Secondary | ICD-10-CM | POA: Diagnosis not present

## 2019-03-25 DIAGNOSIS — E1121 Type 2 diabetes mellitus with diabetic nephropathy: Secondary | ICD-10-CM | POA: Diagnosis not present

## 2019-03-25 DIAGNOSIS — N2581 Secondary hyperparathyroidism of renal origin: Secondary | ICD-10-CM | POA: Diagnosis not present

## 2019-03-25 DIAGNOSIS — D631 Anemia in chronic kidney disease: Secondary | ICD-10-CM | POA: Diagnosis not present

## 2019-03-27 DIAGNOSIS — D631 Anemia in chronic kidney disease: Secondary | ICD-10-CM | POA: Diagnosis not present

## 2019-03-27 DIAGNOSIS — E1121 Type 2 diabetes mellitus with diabetic nephropathy: Secondary | ICD-10-CM | POA: Diagnosis not present

## 2019-03-27 DIAGNOSIS — E785 Hyperlipidemia, unspecified: Secondary | ICD-10-CM | POA: Diagnosis not present

## 2019-03-27 DIAGNOSIS — N186 End stage renal disease: Secondary | ICD-10-CM | POA: Diagnosis not present

## 2019-03-27 DIAGNOSIS — D509 Iron deficiency anemia, unspecified: Secondary | ICD-10-CM | POA: Diagnosis not present

## 2019-03-27 DIAGNOSIS — N2581 Secondary hyperparathyroidism of renal origin: Secondary | ICD-10-CM | POA: Diagnosis not present

## 2019-03-29 DIAGNOSIS — D631 Anemia in chronic kidney disease: Secondary | ICD-10-CM | POA: Diagnosis not present

## 2019-03-29 DIAGNOSIS — N186 End stage renal disease: Secondary | ICD-10-CM | POA: Diagnosis not present

## 2019-03-29 DIAGNOSIS — E785 Hyperlipidemia, unspecified: Secondary | ICD-10-CM | POA: Diagnosis not present

## 2019-03-29 DIAGNOSIS — D509 Iron deficiency anemia, unspecified: Secondary | ICD-10-CM | POA: Diagnosis not present

## 2019-03-29 DIAGNOSIS — N2581 Secondary hyperparathyroidism of renal origin: Secondary | ICD-10-CM | POA: Diagnosis not present

## 2019-03-29 DIAGNOSIS — E1121 Type 2 diabetes mellitus with diabetic nephropathy: Secondary | ICD-10-CM | POA: Diagnosis not present

## 2019-04-01 ENCOUNTER — Other Ambulatory Visit: Payer: Self-pay

## 2019-04-01 DIAGNOSIS — N186 End stage renal disease: Secondary | ICD-10-CM | POA: Diagnosis not present

## 2019-04-01 DIAGNOSIS — E1121 Type 2 diabetes mellitus with diabetic nephropathy: Secondary | ICD-10-CM | POA: Diagnosis not present

## 2019-04-01 DIAGNOSIS — D631 Anemia in chronic kidney disease: Secondary | ICD-10-CM | POA: Diagnosis not present

## 2019-04-01 DIAGNOSIS — D509 Iron deficiency anemia, unspecified: Secondary | ICD-10-CM | POA: Diagnosis not present

## 2019-04-01 DIAGNOSIS — N185 Chronic kidney disease, stage 5: Secondary | ICD-10-CM

## 2019-04-01 DIAGNOSIS — N2581 Secondary hyperparathyroidism of renal origin: Secondary | ICD-10-CM | POA: Diagnosis not present

## 2019-04-01 DIAGNOSIS — E785 Hyperlipidemia, unspecified: Secondary | ICD-10-CM | POA: Diagnosis not present

## 2019-04-03 DIAGNOSIS — D631 Anemia in chronic kidney disease: Secondary | ICD-10-CM | POA: Diagnosis not present

## 2019-04-03 DIAGNOSIS — N2581 Secondary hyperparathyroidism of renal origin: Secondary | ICD-10-CM | POA: Diagnosis not present

## 2019-04-03 DIAGNOSIS — E785 Hyperlipidemia, unspecified: Secondary | ICD-10-CM | POA: Diagnosis not present

## 2019-04-03 DIAGNOSIS — D509 Iron deficiency anemia, unspecified: Secondary | ICD-10-CM | POA: Diagnosis not present

## 2019-04-03 DIAGNOSIS — E1121 Type 2 diabetes mellitus with diabetic nephropathy: Secondary | ICD-10-CM | POA: Diagnosis not present

## 2019-04-03 DIAGNOSIS — N186 End stage renal disease: Secondary | ICD-10-CM | POA: Diagnosis not present

## 2019-04-05 ENCOUNTER — Other Ambulatory Visit (HOSPITAL_COMMUNITY): Payer: Self-pay | Admitting: Student

## 2019-04-05 DIAGNOSIS — D509 Iron deficiency anemia, unspecified: Secondary | ICD-10-CM | POA: Diagnosis not present

## 2019-04-05 DIAGNOSIS — N186 End stage renal disease: Secondary | ICD-10-CM | POA: Diagnosis not present

## 2019-04-05 DIAGNOSIS — E1121 Type 2 diabetes mellitus with diabetic nephropathy: Secondary | ICD-10-CM | POA: Diagnosis not present

## 2019-04-05 DIAGNOSIS — E785 Hyperlipidemia, unspecified: Secondary | ICD-10-CM | POA: Diagnosis not present

## 2019-04-05 DIAGNOSIS — N2581 Secondary hyperparathyroidism of renal origin: Secondary | ICD-10-CM | POA: Diagnosis not present

## 2019-04-05 DIAGNOSIS — D631 Anemia in chronic kidney disease: Secondary | ICD-10-CM | POA: Diagnosis not present

## 2019-04-08 ENCOUNTER — Encounter (HOSPITAL_COMMUNITY): Payer: Medicare Other

## 2019-04-08 DIAGNOSIS — N2581 Secondary hyperparathyroidism of renal origin: Secondary | ICD-10-CM | POA: Diagnosis not present

## 2019-04-08 DIAGNOSIS — D631 Anemia in chronic kidney disease: Secondary | ICD-10-CM | POA: Diagnosis not present

## 2019-04-08 DIAGNOSIS — D509 Iron deficiency anemia, unspecified: Secondary | ICD-10-CM | POA: Diagnosis not present

## 2019-04-08 DIAGNOSIS — E1121 Type 2 diabetes mellitus with diabetic nephropathy: Secondary | ICD-10-CM | POA: Diagnosis not present

## 2019-04-08 DIAGNOSIS — N186 End stage renal disease: Secondary | ICD-10-CM | POA: Diagnosis not present

## 2019-04-08 DIAGNOSIS — E785 Hyperlipidemia, unspecified: Secondary | ICD-10-CM | POA: Diagnosis not present

## 2019-04-10 DIAGNOSIS — E785 Hyperlipidemia, unspecified: Secondary | ICD-10-CM | POA: Diagnosis not present

## 2019-04-10 DIAGNOSIS — N186 End stage renal disease: Secondary | ICD-10-CM | POA: Diagnosis not present

## 2019-04-10 DIAGNOSIS — E1121 Type 2 diabetes mellitus with diabetic nephropathy: Secondary | ICD-10-CM | POA: Diagnosis not present

## 2019-04-10 DIAGNOSIS — D631 Anemia in chronic kidney disease: Secondary | ICD-10-CM | POA: Diagnosis not present

## 2019-04-10 DIAGNOSIS — D509 Iron deficiency anemia, unspecified: Secondary | ICD-10-CM | POA: Diagnosis not present

## 2019-04-10 DIAGNOSIS — N2581 Secondary hyperparathyroidism of renal origin: Secondary | ICD-10-CM | POA: Diagnosis not present

## 2019-04-12 DIAGNOSIS — D631 Anemia in chronic kidney disease: Secondary | ICD-10-CM | POA: Diagnosis not present

## 2019-04-12 DIAGNOSIS — N2581 Secondary hyperparathyroidism of renal origin: Secondary | ICD-10-CM | POA: Diagnosis not present

## 2019-04-12 DIAGNOSIS — D509 Iron deficiency anemia, unspecified: Secondary | ICD-10-CM | POA: Diagnosis not present

## 2019-04-12 DIAGNOSIS — E785 Hyperlipidemia, unspecified: Secondary | ICD-10-CM | POA: Diagnosis not present

## 2019-04-12 DIAGNOSIS — N186 End stage renal disease: Secondary | ICD-10-CM | POA: Diagnosis not present

## 2019-04-12 DIAGNOSIS — E1121 Type 2 diabetes mellitus with diabetic nephropathy: Secondary | ICD-10-CM | POA: Diagnosis not present

## 2019-04-15 DIAGNOSIS — N2581 Secondary hyperparathyroidism of renal origin: Secondary | ICD-10-CM | POA: Diagnosis not present

## 2019-04-15 DIAGNOSIS — N186 End stage renal disease: Secondary | ICD-10-CM | POA: Diagnosis not present

## 2019-04-15 DIAGNOSIS — E785 Hyperlipidemia, unspecified: Secondary | ICD-10-CM | POA: Diagnosis not present

## 2019-04-15 DIAGNOSIS — D631 Anemia in chronic kidney disease: Secondary | ICD-10-CM | POA: Diagnosis not present

## 2019-04-15 DIAGNOSIS — E1121 Type 2 diabetes mellitus with diabetic nephropathy: Secondary | ICD-10-CM | POA: Diagnosis not present

## 2019-04-15 DIAGNOSIS — D509 Iron deficiency anemia, unspecified: Secondary | ICD-10-CM | POA: Diagnosis not present

## 2019-04-17 ENCOUNTER — Telehealth (HOSPITAL_COMMUNITY): Payer: Self-pay | Admitting: *Deleted

## 2019-04-17 DIAGNOSIS — N186 End stage renal disease: Secondary | ICD-10-CM | POA: Diagnosis not present

## 2019-04-17 DIAGNOSIS — E785 Hyperlipidemia, unspecified: Secondary | ICD-10-CM | POA: Diagnosis not present

## 2019-04-17 DIAGNOSIS — N2581 Secondary hyperparathyroidism of renal origin: Secondary | ICD-10-CM | POA: Diagnosis not present

## 2019-04-17 DIAGNOSIS — D631 Anemia in chronic kidney disease: Secondary | ICD-10-CM | POA: Diagnosis not present

## 2019-04-17 DIAGNOSIS — D509 Iron deficiency anemia, unspecified: Secondary | ICD-10-CM | POA: Diagnosis not present

## 2019-04-17 DIAGNOSIS — E1121 Type 2 diabetes mellitus with diabetic nephropathy: Secondary | ICD-10-CM | POA: Diagnosis not present

## 2019-04-17 NOTE — Telephone Encounter (Signed)
The above patient or their representative was contacted and gave the following answers to these questions:         Do you have any of the following symptoms?n  Fever                    Cough                   Shortness of breath  Do  you have any of the following other symptoms? n   muscle pain         vomiting,        diarrhea        rash         weakness        red eye        abdominal pain         bruising          bruising or bleeding              joint pain           severe headache    Have you been in contact with someone who was or has been sick in the past 2 weeks?n  Yes                 Unsure                         Unable to assess   Does the person that you were in contact with have any of the following symptoms?   Cough         shortness of breath           muscle pain         vomiting,            diarrhea            rash            weakness           fever            red eye           abdominal pain           bruising  or  bleeding                joint pain                severe headache               Have you  or someone you have been in contact with traveled internationally in th last month?         If yes, which countries?   Have you  or someone you have been in contact with traveled outside [place] in th last month?     n    If yes, which state and city?   COMMENTS OR ACTION PLAN FOR THIS PATIENT:

## 2019-04-18 ENCOUNTER — Ambulatory Visit (HOSPITAL_COMMUNITY)
Admission: RE | Admit: 2019-04-18 | Discharge: 2019-04-18 | Disposition: A | Discharge status: Home / Self Care | Payer: Medicare Other | Source: Ambulatory Visit | Attending: Vascular Surgery | Admitting: Vascular Surgery

## 2019-04-18 ENCOUNTER — Other Ambulatory Visit: Payer: Self-pay

## 2019-04-18 ENCOUNTER — Ambulatory Visit (INDEPENDENT_AMBULATORY_CARE_PROVIDER_SITE_OTHER): Payer: Medicare Other | Admitting: Family

## 2019-04-18 ENCOUNTER — Encounter: Payer: Self-pay | Admitting: Family

## 2019-04-18 VITALS — Ht 64.0 in | Wt 153.0 lb

## 2019-04-18 DIAGNOSIS — N185 Chronic kidney disease, stage 5: Secondary | ICD-10-CM

## 2019-04-18 DIAGNOSIS — Z89611 Acquired absence of right leg above knee: Secondary | ICD-10-CM

## 2019-04-18 DIAGNOSIS — N186 End stage renal disease: Secondary | ICD-10-CM

## 2019-04-18 DIAGNOSIS — Z87891 Personal history of nicotine dependence: Secondary | ICD-10-CM | POA: Diagnosis not present

## 2019-04-18 DIAGNOSIS — I779 Disorder of arteries and arterioles, unspecified: Secondary | ICD-10-CM | POA: Diagnosis not present

## 2019-04-18 NOTE — Progress Notes (Addendum)
Virtual Visit via Telephone Note   I connected with Jenna Brown on 04/18/2019 using the [employer] by telephone and verified that I was speaking with the correct person using two identifiers. Patient was located at her home and accompanied by herself. I am located at the [facility] office/clinic.   The limitations of evaluation and management by telemedicine and the availability of in person appointments have been previously discussed with the patient and are documented in the patients chart. The patient expressed understanding and consented to proceed.  PCP: [name], MD  Chief Complaint: AVF duplex s/p AVF creation on 03-04-19  History of Present Illness: Jenna Brown is a 68 y.o. female whois s/p right upper arm AV fistula creation on 03-04-19 by Dr. Donnetta Hutching.   Jenna Brown dialyzes Mondays Wednesdays Fridays via right side chest TDC at [facility] dialysis center. Pt sees Dr. Joesph July, nephrologist in [facility].  Jenna Brown denies any steal type symptoms in her right upper extremity.  Jenna Brown denies fever or chills   Jenna Brown also underwent multiple attempts at revascularizationby Dr. Ileene Patrick her right leg for nonhealing wound. On 05/10/2012, Jenna Brown ultimately required a right above-knee amputation.  Jenna Brown denies any symptoms on her left leg, denies non healing wounds. Jenna Brown is performing daily seated leg exercises.   Jenna Brown had a stroke in the in the 1990's as manifested by left facial droop, difficulty speaking, no hemiparesis, no monocular loss of vision.   Her current prosthesis is heavy, states Jenna Brown does not use it since it is too heavy. Jenna Brown gets around in her w/c.   Pt had been lost to follow up from 2016 until 07-02-18; states Jenna Brown felt Jenna Brown did not need to return, no testing was done since 2016 until 07-10-18. Pt lives with her sister and mother.   Jenna Brown was hospitalized in August 2019, had an MI.   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   Diabetic: Yes, last legible A1C result on file was 6.8 on 01-11-18,  improved form 8.2 Tobacco SFK:CLEXNT smoker, quitin 2013, started in her 20's  Pt meds include: Statin :Yes Betablocker:Yes ASA:Yes Other anticoagulants/antiplatelets:Pt states Brilinta was stopped,states Jennye Moccasin caused her to feel like Jenna Brown was dying, did not eat when Jenna Brown was on [place]   Past Medical History:  Diagnosis Date  . Anemia   . Anxiety    takes Xanax prn  . Arthritis   . CKD (chronic kidney disease), stage IV ([place])   . Coronary artery disease 2016   cath w/ 90% LAD, 95%CFX, 80% OM 2, 60% RCA, not CABG candidate, rx medically  . GERD (gastroesophageal reflux disease)   . H/O hiatal hernia   . Headache(784.0)    when b/p is elevated  . Hemorrhoids   . Hx of AKA (above knee amputation), right ([place])   . Hyperlipidemia    takes Simvastatin daily  . Hypertension    takes Amlodipine/HCTZ/Losartan daily  . Migraines   . Myocardial infarction ([place]) 1990's  . NSTEMI (non-ST elevated myocardial infarction) ([place])   . Peripheral vascular disease ([place])   . PONV (postoperative nausea and vomiting)   . Stroke [facility])    TIA history  . Type II diabetes mellitus (HCC)    on Lantus  . Vertigo    takes Ativert prn    Past Surgical History:  Procedure Laterality Date  . ABDOMINAL AORTAGRAM N/A 02/29/2012   Procedure: ABDOMINAL Maxcine Ham;  Surgeon: [name], MD;  Location: [facility] CATH LAB;  Service: Cardiovascular;  Laterality: N/A;  .  AMPUTATION  05/10/2012   Procedure: AMPUTATION ABOVE KNEE;  Surgeon: [name], MD;  Location: [facility] OR;  Service: Vascular;  Laterality: Right;   open right groin wound noted  . aortogram  02/2012  . AV FISTULA PLACEMENT Right 03/04/2019   Procedure: ARTERIOVENOUS (AV) FISTULA CREATION;  Surgeon: Rosetta Posner, MD;  Location: [facility];  Service: Vascular;  Laterality: Right;  . CARDIAC CATHETERIZATION N/A 04/13/2015   Procedure: Right/Left Heart Cath and Coronary Angiography;  Surgeon: [name], MD;  Location: [place] CV LAB;   Service: Cardiovascular;  Laterality: N/A;  . CLEFT PALATE REPAIR     several  . COLONOSCOPY    . DILATION AND CURETTAGE OF UTERUS    . FEMORAL-POPLITEAL BYPASS GRAFT  04/05/2012   Procedure: BYPASS GRAFT FEMORAL-POPLITEAL ARTERY;  Surgeon: [name], MD;  Location: [facility];  Service: Vascular;  Laterality: Right;  REVISION  . FEMORAL-TIBIAL BYPASS GRAFT  03/29/2012   Procedure: BYPASS GRAFT FEMORAL-TIBIAL ARTERY;  Surgeon: [name], MD;  Location: [place] OR;  Service: Vascular;  Laterality: Right;  Right femoral to Posterior Tibialis with composite graft of 22mm x 80 cm ringed gortex graft and saphenous vein  ,intraoperative arteriogram  . FEMOROPOPLITEAL THROMBECTOMY / EMBOLECTOMY  05/09/2012  . IR FLUORO GUIDE CV LINE RIGHT  03/01/2019  . IR US GUIDE VASC ACCESS RIGHT  03/01/2019  . MYOMECTOMY    . PR VEIN BYPASS GRAFT,AORTO-FEM-POP  05/27/11   Right SFA-Below knee Pop BP  . TEE WITHOUT CARDIOVERSION N/A 01/03/2017   Procedure: TRANSESOPHAGEAL ECHOCARDIOGRAM (TEE);  Surgeon: Skeet Latch, MD;  Location: [facility];  Service: Cardiovascular;  Laterality: N/A;  . THORACENTESIS  02/27/2019      . TONSILLECTOMY    . TUBAL LIGATION  ~ 1990    Current Meds  Medication Sig  . acetaminophen (TYLENOL) 500 MG tablet Take 500 mg by mouth every 6 (six) hours as needed for mild pain.  Marland Kitchen ALPRAZolam (XANAX) 0.25 MG tablet Take 1 tablet (0.25 mg total) by mouth 3 (three) times daily as needed for anxiety. (Patient taking differently: Take 0.25 mg by mouth daily as needed for anxiety. )  . aspirin 81 MG chewable tablet Chew 81 mg by mouth daily.  Marland Kitchen atorvastatin (LIPITOR) 80 MG tablet Take 80 mg by mouth daily.   . bisacodyl (DULCOLAX) 5 MG EC tablet Take 2 tablets (10 mg total) by mouth daily as needed for moderate constipation.  . insulin glargine (LANTUS) 100 UNIT/ML injection Inject 0.2 mLs (20 Units total) into the skin at bedtime.  . multivitamin (RENA-VIT) TABS tablet Take 1 tablet by mouth  at bedtime.  . polyethylene glycol (MIRALAX / GLYCOLAX) 17 g packet Take 17 g by mouth daily as needed for mild constipation.  . sodium bicarbonate 650 MG tablet Take 1,300 mg by mouth 2 (two) times daily.    12 system ROS was negative unless otherwise noted in HPI   Observations/Objective:  Right Arm AVF Duplex (04-18-19): Findings: +--------------------+----------+-----------------+--------+ AVF                 PSV (cm/s)Flow Vol (mL/min)Comments +--------------------+----------+-----------------+--------+ Native artery inflow   220          1236                +--------------------+----------+-----------------+--------+ AVF Anastomosis        770                              +--------------------+----------+-----------------+--------+ +------------+----------+-------------+----------+----------------+  OUTFLOW VEINPSV (cm/s)Diameter (cm)Depth (cm)    Describe     +------------+----------+-------------+----------+----------------+ Prox UA        134        0.58        1.25                    +------------+----------+-------------+----------+----------------+ Mid UA         150        0.57        0.77                    +------------+----------+-------------+----------+----------------+ Dist UA        250        0.85        0.22                    +------------+----------+-------------+----------+----------------+ AC Fossa       633        0.18        0.69   competing branch +------------+----------+-------------+----------+----------------+ Summary: Patent right arterio-venous fistula with increased velocity at the anastomosis and in the outflow vein at area of slight narrowing / calliber of vein ( approximately .61 cms in length).    Left ABI (07-10-18): ABI Findings: +--------+------------------+-----+--------+--------+ Right   Rt Pressure (mmHg)IndexWaveformComment  +--------+------------------+-----+--------+--------+  [id number]                                     +--------+------------------+-----+--------+--------+  +---------+------------------+-----+----------+-------+ Left     Lt Pressure (mmHg)IndexWaveform  Comment +---------+------------------+-----+----------+-------+ Brachial 172                                      +---------+------------------+-----+----------+-------+ ATA      126               0.70 monophasic        +---------+------------------+-----+----------+-------+ PTA      119               0.66 monophasic        +---------+------------------+-----+----------+-------+ Great Toe66                0.37                   +---------+------------------+-----+----------+-------+  +-------+-----------+-----------+------------+------------+ ABI/TBIToday's ABIToday's TBIPrevious ABIPrevious TBI +-------+-----------+-----------+------------+------------+ Right  Amputation            Amputation               +-------+-----------+-----------+------------+------------+ Left   0.70       0.37       0.96        0.72         +-------+-----------+-----------+------------+------------+  Left ABIs appear decreased compared to prior study on 01/02/2015.   Summary: Left: Resting left ankle-brachial index indicates moderate left lower extremity arterial disease. The left toe-brachial index is abnormal. LT Great toe pressure = 66 mmHg.   Assessment and Plan: Will need another AVF duplex in 6 weeks; most depths are too deep, most diameters are too narrow.  Left ABI due about October 2020.   Follow Up Instructions:   Follow up 6 weeks with right arm AVF duplex, see me or PA afterward.  I instructed her to squeeze a ball or rolled up washcloth with her right  hand multiple times/day to help increase the diameters of the AVF.    I discussed the assessment and treatment plan with the patient. The patient was provided an opportunity to ask  questions and all were answered. The patient agreed with the plan and demonstrated an understanding of the instructions.   The patient was advised to call back or seek an in-person evaluation if the symptoms worsen or if the condition fails to improve as anticipated.  I spent 14 minutes with the patient via telephone encounter.   Gabrielle Dare  [facility] Office: [phone]  04/18/2019, 5:14 PM

## 2019-04-19 DIAGNOSIS — Z992 Dependence on renal dialysis: Secondary | ICD-10-CM | POA: Diagnosis not present

## 2019-04-19 DIAGNOSIS — N186 End stage renal disease: Secondary | ICD-10-CM | POA: Diagnosis not present

## 2019-04-19 DIAGNOSIS — N2581 Secondary hyperparathyroidism of renal origin: Secondary | ICD-10-CM | POA: Diagnosis not present

## 2019-04-19 DIAGNOSIS — D509 Iron deficiency anemia, unspecified: Secondary | ICD-10-CM | POA: Diagnosis not present

## 2019-04-19 DIAGNOSIS — D631 Anemia in chronic kidney disease: Secondary | ICD-10-CM | POA: Diagnosis not present

## 2019-04-19 DIAGNOSIS — E1121 Type 2 diabetes mellitus with diabetic nephropathy: Secondary | ICD-10-CM | POA: Diagnosis not present

## 2019-04-19 DIAGNOSIS — E785 Hyperlipidemia, unspecified: Secondary | ICD-10-CM | POA: Diagnosis not present

## 2019-04-22 DIAGNOSIS — E1121 Type 2 diabetes mellitus with diabetic nephropathy: Secondary | ICD-10-CM | POA: Diagnosis not present

## 2019-04-22 DIAGNOSIS — N186 End stage renal disease: Secondary | ICD-10-CM | POA: Diagnosis not present

## 2019-04-22 DIAGNOSIS — D509 Iron deficiency anemia, unspecified: Secondary | ICD-10-CM | POA: Diagnosis not present

## 2019-04-22 DIAGNOSIS — N2581 Secondary hyperparathyroidism of renal origin: Secondary | ICD-10-CM | POA: Diagnosis not present

## 2019-04-22 DIAGNOSIS — D631 Anemia in chronic kidney disease: Secondary | ICD-10-CM | POA: Diagnosis not present

## 2019-04-24 DIAGNOSIS — N186 End stage renal disease: Secondary | ICD-10-CM | POA: Diagnosis not present

## 2019-04-24 DIAGNOSIS — E1121 Type 2 diabetes mellitus with diabetic nephropathy: Secondary | ICD-10-CM | POA: Diagnosis not present

## 2019-04-24 DIAGNOSIS — D509 Iron deficiency anemia, unspecified: Secondary | ICD-10-CM | POA: Diagnosis not present

## 2019-04-24 DIAGNOSIS — N2581 Secondary hyperparathyroidism of renal origin: Secondary | ICD-10-CM | POA: Diagnosis not present

## 2019-04-24 DIAGNOSIS — D631 Anemia in chronic kidney disease: Secondary | ICD-10-CM | POA: Diagnosis not present

## 2019-04-26 DIAGNOSIS — D509 Iron deficiency anemia, unspecified: Secondary | ICD-10-CM | POA: Diagnosis not present

## 2019-04-26 DIAGNOSIS — D631 Anemia in chronic kidney disease: Secondary | ICD-10-CM | POA: Diagnosis not present

## 2019-04-26 DIAGNOSIS — E1121 Type 2 diabetes mellitus with diabetic nephropathy: Secondary | ICD-10-CM | POA: Diagnosis not present

## 2019-04-26 DIAGNOSIS — N2581 Secondary hyperparathyroidism of renal origin: Secondary | ICD-10-CM | POA: Diagnosis not present

## 2019-04-26 DIAGNOSIS — N186 End stage renal disease: Secondary | ICD-10-CM | POA: Diagnosis not present

## 2019-04-29 DIAGNOSIS — N186 End stage renal disease: Secondary | ICD-10-CM | POA: Diagnosis not present

## 2019-04-29 DIAGNOSIS — D631 Anemia in chronic kidney disease: Secondary | ICD-10-CM | POA: Diagnosis not present

## 2019-04-29 DIAGNOSIS — D509 Iron deficiency anemia, unspecified: Secondary | ICD-10-CM | POA: Diagnosis not present

## 2019-04-29 DIAGNOSIS — E1121 Type 2 diabetes mellitus with diabetic nephropathy: Secondary | ICD-10-CM | POA: Diagnosis not present

## 2019-04-29 DIAGNOSIS — N2581 Secondary hyperparathyroidism of renal origin: Secondary | ICD-10-CM | POA: Diagnosis not present

## 2019-05-01 DIAGNOSIS — N2581 Secondary hyperparathyroidism of renal origin: Secondary | ICD-10-CM | POA: Diagnosis not present

## 2019-05-01 DIAGNOSIS — N186 End stage renal disease: Secondary | ICD-10-CM | POA: Diagnosis not present

## 2019-05-01 DIAGNOSIS — D509 Iron deficiency anemia, unspecified: Secondary | ICD-10-CM | POA: Diagnosis not present

## 2019-05-01 DIAGNOSIS — E1121 Type 2 diabetes mellitus with diabetic nephropathy: Secondary | ICD-10-CM | POA: Diagnosis not present

## 2019-05-01 DIAGNOSIS — D631 Anemia in chronic kidney disease: Secondary | ICD-10-CM | POA: Diagnosis not present

## 2019-05-03 DIAGNOSIS — N186 End stage renal disease: Secondary | ICD-10-CM | POA: Diagnosis not present

## 2019-05-03 DIAGNOSIS — D631 Anemia in chronic kidney disease: Secondary | ICD-10-CM | POA: Diagnosis not present

## 2019-05-03 DIAGNOSIS — E1121 Type 2 diabetes mellitus with diabetic nephropathy: Secondary | ICD-10-CM | POA: Diagnosis not present

## 2019-05-03 DIAGNOSIS — D509 Iron deficiency anemia, unspecified: Secondary | ICD-10-CM | POA: Diagnosis not present

## 2019-05-03 DIAGNOSIS — N2581 Secondary hyperparathyroidism of renal origin: Secondary | ICD-10-CM | POA: Diagnosis not present

## 2019-05-06 DIAGNOSIS — N186 End stage renal disease: Secondary | ICD-10-CM | POA: Diagnosis not present

## 2019-05-06 DIAGNOSIS — N2581 Secondary hyperparathyroidism of renal origin: Secondary | ICD-10-CM | POA: Diagnosis not present

## 2019-05-06 DIAGNOSIS — N184 Chronic kidney disease, stage 4 (severe): Secondary | ICD-10-CM | POA: Diagnosis not present

## 2019-05-06 DIAGNOSIS — E538 Deficiency of other specified B group vitamins: Secondary | ICD-10-CM | POA: Diagnosis not present

## 2019-05-06 DIAGNOSIS — Z0001 Encounter for general adult medical examination with abnormal findings: Secondary | ICD-10-CM | POA: Diagnosis not present

## 2019-05-06 DIAGNOSIS — I119 Hypertensive heart disease without heart failure: Secondary | ICD-10-CM | POA: Diagnosis not present

## 2019-05-06 DIAGNOSIS — I10 Essential (primary) hypertension: Secondary | ICD-10-CM | POA: Diagnosis not present

## 2019-05-06 DIAGNOSIS — D509 Iron deficiency anemia, unspecified: Secondary | ICD-10-CM | POA: Diagnosis not present

## 2019-05-06 DIAGNOSIS — Z1389 Encounter for screening for other disorder: Secondary | ICD-10-CM | POA: Diagnosis not present

## 2019-05-06 DIAGNOSIS — E559 Vitamin D deficiency, unspecified: Secondary | ICD-10-CM | POA: Diagnosis not present

## 2019-05-06 DIAGNOSIS — D631 Anemia in chronic kidney disease: Secondary | ICD-10-CM | POA: Diagnosis not present

## 2019-05-06 DIAGNOSIS — D638 Anemia in other chronic diseases classified elsewhere: Secondary | ICD-10-CM | POA: Diagnosis not present

## 2019-05-06 DIAGNOSIS — E1121 Type 2 diabetes mellitus with diabetic nephropathy: Secondary | ICD-10-CM | POA: Diagnosis not present

## 2019-05-06 DIAGNOSIS — Z89611 Acquired absence of right leg above knee: Secondary | ICD-10-CM | POA: Diagnosis not present

## 2019-05-06 DIAGNOSIS — E119 Type 2 diabetes mellitus without complications: Secondary | ICD-10-CM | POA: Diagnosis not present

## 2019-05-08 DIAGNOSIS — D631 Anemia in chronic kidney disease: Secondary | ICD-10-CM | POA: Diagnosis not present

## 2019-05-08 DIAGNOSIS — E1121 Type 2 diabetes mellitus with diabetic nephropathy: Secondary | ICD-10-CM | POA: Diagnosis not present

## 2019-05-08 DIAGNOSIS — N2581 Secondary hyperparathyroidism of renal origin: Secondary | ICD-10-CM | POA: Diagnosis not present

## 2019-05-08 DIAGNOSIS — N186 End stage renal disease: Secondary | ICD-10-CM | POA: Diagnosis not present

## 2019-05-08 DIAGNOSIS — D509 Iron deficiency anemia, unspecified: Secondary | ICD-10-CM | POA: Diagnosis not present

## 2019-05-10 DIAGNOSIS — N2581 Secondary hyperparathyroidism of renal origin: Secondary | ICD-10-CM | POA: Diagnosis not present

## 2019-05-10 DIAGNOSIS — D509 Iron deficiency anemia, unspecified: Secondary | ICD-10-CM | POA: Diagnosis not present

## 2019-05-10 DIAGNOSIS — E1121 Type 2 diabetes mellitus with diabetic nephropathy: Secondary | ICD-10-CM | POA: Diagnosis not present

## 2019-05-10 DIAGNOSIS — N186 End stage renal disease: Secondary | ICD-10-CM | POA: Diagnosis not present

## 2019-05-10 DIAGNOSIS — D631 Anemia in chronic kidney disease: Secondary | ICD-10-CM | POA: Diagnosis not present

## 2019-05-13 DIAGNOSIS — N186 End stage renal disease: Secondary | ICD-10-CM | POA: Diagnosis not present

## 2019-05-13 DIAGNOSIS — D631 Anemia in chronic kidney disease: Secondary | ICD-10-CM | POA: Diagnosis not present

## 2019-05-13 DIAGNOSIS — N2581 Secondary hyperparathyroidism of renal origin: Secondary | ICD-10-CM | POA: Diagnosis not present

## 2019-05-13 DIAGNOSIS — D509 Iron deficiency anemia, unspecified: Secondary | ICD-10-CM | POA: Diagnosis not present

## 2019-05-13 DIAGNOSIS — E1121 Type 2 diabetes mellitus with diabetic nephropathy: Secondary | ICD-10-CM | POA: Diagnosis not present

## 2019-05-15 DIAGNOSIS — N186 End stage renal disease: Secondary | ICD-10-CM | POA: Diagnosis not present

## 2019-05-15 DIAGNOSIS — D509 Iron deficiency anemia, unspecified: Secondary | ICD-10-CM | POA: Diagnosis not present

## 2019-05-15 DIAGNOSIS — N2581 Secondary hyperparathyroidism of renal origin: Secondary | ICD-10-CM | POA: Diagnosis not present

## 2019-05-15 DIAGNOSIS — E1121 Type 2 diabetes mellitus with diabetic nephropathy: Secondary | ICD-10-CM | POA: Diagnosis not present

## 2019-05-15 DIAGNOSIS — D631 Anemia in chronic kidney disease: Secondary | ICD-10-CM | POA: Diagnosis not present

## 2019-05-17 DIAGNOSIS — N2581 Secondary hyperparathyroidism of renal origin: Secondary | ICD-10-CM | POA: Diagnosis not present

## 2019-05-17 DIAGNOSIS — N186 End stage renal disease: Secondary | ICD-10-CM | POA: Diagnosis not present

## 2019-05-17 DIAGNOSIS — D631 Anemia in chronic kidney disease: Secondary | ICD-10-CM | POA: Diagnosis not present

## 2019-05-17 DIAGNOSIS — D509 Iron deficiency anemia, unspecified: Secondary | ICD-10-CM | POA: Diagnosis not present

## 2019-05-17 DIAGNOSIS — E1121 Type 2 diabetes mellitus with diabetic nephropathy: Secondary | ICD-10-CM | POA: Diagnosis not present

## 2019-05-20 DIAGNOSIS — N186 End stage renal disease: Secondary | ICD-10-CM | POA: Diagnosis not present

## 2019-05-20 DIAGNOSIS — E1121 Type 2 diabetes mellitus with diabetic nephropathy: Secondary | ICD-10-CM | POA: Diagnosis not present

## 2019-05-20 DIAGNOSIS — N2581 Secondary hyperparathyroidism of renal origin: Secondary | ICD-10-CM | POA: Diagnosis not present

## 2019-05-20 DIAGNOSIS — Z992 Dependence on renal dialysis: Secondary | ICD-10-CM | POA: Diagnosis not present

## 2019-05-20 DIAGNOSIS — D631 Anemia in chronic kidney disease: Secondary | ICD-10-CM | POA: Diagnosis not present

## 2019-05-20 DIAGNOSIS — D509 Iron deficiency anemia, unspecified: Secondary | ICD-10-CM | POA: Diagnosis not present

## 2019-05-22 DIAGNOSIS — D631 Anemia in chronic kidney disease: Secondary | ICD-10-CM | POA: Diagnosis not present

## 2019-05-22 DIAGNOSIS — D509 Iron deficiency anemia, unspecified: Secondary | ICD-10-CM | POA: Diagnosis not present

## 2019-05-22 DIAGNOSIS — N186 End stage renal disease: Secondary | ICD-10-CM | POA: Diagnosis not present

## 2019-05-22 DIAGNOSIS — N2581 Secondary hyperparathyroidism of renal origin: Secondary | ICD-10-CM | POA: Diagnosis not present

## 2019-05-22 DIAGNOSIS — E1121 Type 2 diabetes mellitus with diabetic nephropathy: Secondary | ICD-10-CM | POA: Diagnosis not present

## 2019-05-24 DIAGNOSIS — N186 End stage renal disease: Secondary | ICD-10-CM | POA: Diagnosis not present

## 2019-05-24 DIAGNOSIS — E1121 Type 2 diabetes mellitus with diabetic nephropathy: Secondary | ICD-10-CM | POA: Diagnosis not present

## 2019-05-24 DIAGNOSIS — D631 Anemia in chronic kidney disease: Secondary | ICD-10-CM | POA: Diagnosis not present

## 2019-05-24 DIAGNOSIS — D509 Iron deficiency anemia, unspecified: Secondary | ICD-10-CM | POA: Diagnosis not present

## 2019-05-24 DIAGNOSIS — N2581 Secondary hyperparathyroidism of renal origin: Secondary | ICD-10-CM | POA: Diagnosis not present

## 2019-05-27 DIAGNOSIS — E1121 Type 2 diabetes mellitus with diabetic nephropathy: Secondary | ICD-10-CM | POA: Diagnosis not present

## 2019-05-27 DIAGNOSIS — N186 End stage renal disease: Secondary | ICD-10-CM | POA: Diagnosis not present

## 2019-05-27 DIAGNOSIS — N2581 Secondary hyperparathyroidism of renal origin: Secondary | ICD-10-CM | POA: Diagnosis not present

## 2019-05-27 DIAGNOSIS — D631 Anemia in chronic kidney disease: Secondary | ICD-10-CM | POA: Diagnosis not present

## 2019-05-27 DIAGNOSIS — D509 Iron deficiency anemia, unspecified: Secondary | ICD-10-CM | POA: Diagnosis not present

## 2019-05-29 ENCOUNTER — Other Ambulatory Visit: Payer: Self-pay

## 2019-05-29 DIAGNOSIS — N186 End stage renal disease: Secondary | ICD-10-CM | POA: Diagnosis not present

## 2019-05-29 DIAGNOSIS — D509 Iron deficiency anemia, unspecified: Secondary | ICD-10-CM | POA: Diagnosis not present

## 2019-05-29 DIAGNOSIS — D631 Anemia in chronic kidney disease: Secondary | ICD-10-CM | POA: Diagnosis not present

## 2019-05-29 DIAGNOSIS — E1121 Type 2 diabetes mellitus with diabetic nephropathy: Secondary | ICD-10-CM | POA: Diagnosis not present

## 2019-05-29 DIAGNOSIS — N2581 Secondary hyperparathyroidism of renal origin: Secondary | ICD-10-CM | POA: Diagnosis not present

## 2019-05-29 DIAGNOSIS — N185 Chronic kidney disease, stage 5: Secondary | ICD-10-CM

## 2019-05-30 ENCOUNTER — Telehealth (HOSPITAL_COMMUNITY): Payer: Self-pay

## 2019-05-30 ENCOUNTER — Telehealth: Payer: Self-pay | Admitting: *Deleted

## 2019-05-30 ENCOUNTER — Ambulatory Visit (INDEPENDENT_AMBULATORY_CARE_PROVIDER_SITE_OTHER): Payer: Medicare Other | Admitting: Cardiovascular Disease

## 2019-05-30 VITALS — BP 162/90 | HR 86 | Wt 152.0 lb

## 2019-05-30 DIAGNOSIS — I208 Other forms of angina pectoris: Secondary | ICD-10-CM

## 2019-05-30 DIAGNOSIS — I5042 Chronic combined systolic (congestive) and diastolic (congestive) heart failure: Secondary | ICD-10-CM

## 2019-05-30 DIAGNOSIS — N186 End stage renal disease: Secondary | ICD-10-CM

## 2019-05-30 DIAGNOSIS — I2510 Atherosclerotic heart disease of native coronary artery without angina pectoris: Secondary | ICD-10-CM | POA: Diagnosis not present

## 2019-05-30 DIAGNOSIS — I10 Essential (primary) hypertension: Secondary | ICD-10-CM

## 2019-05-30 NOTE — Telephone Encounter (Signed)

## 2019-05-30 NOTE — Progress Notes (Signed)
Cardiology Virtual Appointment via Telephone  This visit type was conducted due to national recommendations for restrictions regarding the COVID-19 Pandemic (e.g. social distancing) in an effort to limit this patient's exposure and mitigate transmission in our community.  Due to her co-morbid illnesses, this patient is at least at moderate risk for complications without adequate follow up.  This format is felt to be most appropriate for this patient at this time.  The patient did not have access to video technology/had technical difficulties with video requiring transitioning to audio format only (telephone).  All issues noted in this document were discussed and addressed.  No physical exam could be performed with this format.  Please refer to the patient's chart for her  consent to telehealth for [facility].   Date:  05/31/2019   ID:  SHELSEA Brown, DOB 1951-02-09, MRN [id number]  PCP:  [name], MD  Cardiologist:   Skeet Latch, MD   No chief complaint on file.    History of Present Illness: Jenna Brown is a 68 y.o. female with chronic systolic and diastolic heart failure, severe CAD, not a candidate for CABG, ESRD on HD (MWF) s/p R AKA, hypertension, DM here for follow up.  [name] was previously a patient of Dr. Haroldine Laws.  She was initially admitted 03/2015 with dyspnea.  She underwent left and right heart catheterization that admission and was found to have severe three-vessel disease.  However she was not a candidate for CABG due to comorbidities and poor targets.  She required short-term milrinone for cardiogenic shock.  She was diuresed and able to get off of milrinone.  She was then admitted 12/2016 with hypertensive emergency and acute renal failure.  Troponin was elevated to 4.6.  This was felt to be due to demand ischemia.  She was treated with nitroglycerin and a heparin drip.  She was then again admitted 03/2017 with NSTEMI in the setting of not taking ticagrelor  as prescribed.  She was medically managed given her chronic kidney disease.  She was again admitted with NSTEMI 12/2017 with a peak troponin of 10.5.  She was again medically managed given CKD IV-V.  She was also evaluated by surgery and still considered not to be a surgical candidate.  Most recently she was admitted 04/2018 with chest pain and NSTEMI.  Troponin peaked with 2.2.  She is evaluated by Dr. Martinique who recommended medical management and not pursuing cardiac catheterization.  She last followed up in the heart failure clinic 07/2018.  At that time she was feeling well.  She had an echo 07/2018 that revealed LVEF 45 to 50% with hypokinesis in the apical inferolateral, inferior, inferoseptal, and apical myocardium.  At her last appointment hydralazine was increased.  Since that time she was admitted 02/2019 with chest pain and heart failure.  She self discontinued ticagrelor 01/2019 because it caused shortness of breath. Her renal dysfunction progressed to ESRD and she started dialysis.  She had an AV fistula placed 03/04/19.  She also had bilateral pleural effusions and underwent right thoracentesis with removal of 1.2 L of fluid.  She followed up with [name], DNP, on 03/14/2019 and was doing well.  She is going to the vascular center today and hopes to be able to start using her fistula next week.  She is tolerating HD well.  Occasionally her BP gets low and nearly passes out.  She reports that this is when they run her too fast.  Her BP is  usually "good" when she first gets to HD.  She doesn't check it at home often.  She denies LE edema since starting HD and is happy that she has been able to get off a lot of her medications.  She occasionally has to use nitroglycerin for chest pain.  One tablet always helps.  Her breathing has been stable.   Past Medical History:  Diagnosis Date  . Anemia   . Anxiety    takes Xanax prn  . Arthritis   . CKD (chronic kidney disease), stage IV ([place])   .  Coronary artery disease 2016   cath w/ 90% LAD, 95%CFX, 80% OM 2, 60% RCA, not CABG candidate, rx medically  . GERD (gastroesophageal reflux disease)   . H/O hiatal hernia   . Headache(784.0)    when b/p is elevated  . Hemorrhoids   . Hx of AKA (above knee amputation), right ([place])   . Hyperlipidemia    takes Simvastatin daily  . Hypertension    takes Amlodipine/HCTZ/Losartan daily  . Migraines   . Myocardial infarction ([place]) 1990's  . NSTEMI (non-ST elevated myocardial infarction) ([place])   . Peripheral vascular disease ([place])   . PONV (postoperative nausea and vomiting)   . Stroke [facility])    TIA history  . Type II diabetes mellitus (HCC)    on Lantus  . Vertigo    takes Ativert prn    Past Surgical History:  Procedure Laterality Date  . ABDOMINAL AORTAGRAM N/A 02/29/2012   Procedure: ABDOMINAL Maxcine Ham;  Surgeon: [name], MD;  Location: [facility] CATH LAB;  Service: Cardiovascular;  Laterality: N/A;  . AMPUTATION  05/10/2012   Procedure: AMPUTATION ABOVE KNEE;  Surgeon: [name], MD;  Location: [place] OR;  Service: Vascular;  Laterality: Right;   open right groin wound noted  . aortogram  02/2012  . AV FISTULA PLACEMENT Right 03/04/2019   Procedure: ARTERIOVENOUS (AV) FISTULA CREATION;  Surgeon: Rosetta Posner, MD;  Location: [place];  Service: Vascular;  Laterality: Right;  . CARDIAC CATHETERIZATION N/A 04/13/2015   Procedure: Right/Left Heart Cath and Coronary Angiography;  Surgeon: [name], MD;  Location: [place] CV LAB;  Service: Cardiovascular;  Laterality: N/A;  . CLEFT PALATE REPAIR     several  . COLONOSCOPY    . DILATION AND CURETTAGE OF UTERUS    . FEMORAL-POPLITEAL BYPASS GRAFT  04/05/2012   Procedure: BYPASS GRAFT FEMORAL-POPLITEAL ARTERY;  Surgeon: [name], MD;  Location: [employer];  Service: Vascular;  Laterality: Right;  REVISION  . FEMORAL-TIBIAL BYPASS GRAFT  03/29/2012   Procedure: BYPASS GRAFT FEMORAL-TIBIAL ARTERY;  Surgeon: [name], MD;   Location: [place] OR;  Service: Vascular;  Laterality: Right;  Right femoral to Posterior Tibialis with composite graft of 35mm x 80 cm ringed gortex graft and saphenous vein  ,intraoperative arteriogram  . FEMOROPOPLITEAL THROMBECTOMY / EMBOLECTOMY  05/09/2012  . IR FLUORO GUIDE CV LINE RIGHT  03/01/2019  . IR US GUIDE VASC ACCESS RIGHT  03/01/2019  . MYOMECTOMY    . PR VEIN BYPASS GRAFT,AORTO-FEM-POP  05/27/11   Right SFA-Below knee Pop BP  . TEE WITHOUT CARDIOVERSION N/A 01/03/2017   Procedure: TRANSESOPHAGEAL ECHOCARDIOGRAM (TEE);  Surgeon: Skeet Latch, MD;  Location: [facility];  Service: Cardiovascular;  Laterality: N/A;  . THORACENTESIS  02/27/2019      . TONSILLECTOMY    . TUBAL LIGATION  ~ 1990     Current Outpatient Medications  Medication Sig Dispense Refill  .  carvedilol (COREG) 12.5 MG tablet Take 18.75 mg by mouth 2 (two) times daily with a meal. One and a half tablets twice daily.    Marland Kitchen acetaminophen (TYLENOL) 500 MG tablet Take 500 mg by mouth every 6 (six) hours as needed for mild pain.    Marland Kitchen ALPRAZolam (XANAX) 0.25 MG tablet Take 1 tablet (0.25 mg total) by mouth 3 (three) times daily as needed for anxiety. (Patient taking differently: Take 0.25 mg by mouth daily as needed for anxiety. ) 12 tablet 0  . aspirin 81 MG chewable tablet Chew 81 mg by mouth daily.    Marland Kitchen atorvastatin (LIPITOR) 80 MG tablet Take 80 mg by mouth daily.     . bisacodyl (DULCOLAX) 5 MG EC tablet Take 2 tablets (10 mg total) by mouth daily as needed for moderate constipation. 30 tablet 0  . Cholecalciferol (VITAMIN D-3 PO) Take 4,000 Units by mouth daily.     . ferrous sulfate 325 (65 FE) MG tablet Take 957 mg by mouth daily with breakfast.     . insulin glargine (LANTUS) 100 UNIT/ML injection Inject 0.2 mLs (20 Units total) into the skin at bedtime. 10 mL 11  . multivitamin (RENA-VIT) TABS tablet Take 1 tablet by mouth at bedtime. 30 tablet 2  . nitroGLYCERIN (NITROSTAT) 0.4 MG SL tablet Place 1 tablet (0.4  mg total) under the tongue every 5 (five) minutes x 3 doses as needed for chest pain. (Patient not taking: Reported on 04/18/2019) 25 tablet 3  . polyethylene glycol (MIRALAX / GLYCOLAX) 17 g packet Take 17 g by mouth daily as needed for mild constipation. 14 each 0  . sodium bicarbonate 650 MG tablet Take 1,300 mg by mouth 2 (two) times daily.    . sodium polystyrene (KAYEXALATE) 15 GM/60ML suspension Take 7.5 mg by mouth once.     No current facility-administered medications for this visit.     Allergies:   Plavix [clopidogrel bisulfate], Codeine, and Tylenol with codeine #3 [acetaminophen-codeine]    Social History:  The patient  reports that she quit smoking about 7 years ago. Her smoking use included cigarettes. She has a 10.00 pack-year smoking history. She has never used smokeless tobacco. She reports that she does not drink alcohol or use drugs.   Family History:  The patient's family history includes Cancer in her brother and mother; Diabetes in her father; Heart attack in her maternal grandfather and maternal grandmother.    ROS:  Please see the history of present illness.   Otherwise, review of systems are positive for none.   All other systems are reviewed and negative.    PHYSICAL EXAM: VS:  BP (!) 162/90   Pulse 86   Wt 152 lb (68.9 kg)   BMI 26.09 kg/m  , BMI Body mass index is 26.09 kg/m. GENERAL:  Sounds well LUNGS:  Respirations unlabored NEURO:  Speech fluent.   PSYCH:  Cognitively intact, oriented to person place and time   EKG:  EKG is not ordered today. The ekg ordered 11/16/18 demonstrates sinus rhythm.  Rate 91 bpm.   Prior inferior infarct.  Diffuse T wave abnormality  TEE 01/03/17  45%, trivial MR and TR, no LA/LAA thrombus or mass, negative bubble study.   Echo 03/2017 Left ventricle: LVEF is approximately 45 to 50% with mild hypokinesis of the distal inferior/inferoseptal and apical walls. The cavity size was normal. Wall thickness was increased in  a pattern of mild LVH. Doppler parameters are consistent with abnormal left ventricular  relaxation (grade 1 diastolic dysfunction). - Aortic valve: AV is thickened, calcified In the parasternal view there is an nodular echo bright segment (18 x 7 mm) May represent nodular thickening of valve Not convincing for discrete mass. It appears to be present on TTE in April 2018 TEE after that showed no abnormalitiy to AV. - Mitral valve: Calcified annulus. Mildly thickened leaflets   Echo 07/2018: Study Conclusions  - Left ventricle: The cavity size was normal. There was mild focal   basal hypertrophy of the septum. Systolic function was mildly   reduced. The estimated ejection fraction was in the range of 45%   to 50%. Hypokinesis of the apicalinferolateral, inferior,   inferoseptal, and apical myocardium. - Aortic valve: Trileaflet; mildly thickened, mildly calcified   leaflets. Left coronary cusp mobility was moderately restricted. - Mitral valve: Calcified annulus. Mildly thickened leaflets .   There was trivial regurgitation. - Right ventricle: The cavity size was mildly dilated. Wall   thickness was normal. Systolic function was low normal. - Atrial septum: No defect or patent foramen ovale was identified. - Tricuspid valve: There was no significant regurgitation. - Pulmonic valve: There was trivial regurgitation.  Impressions:  - EF using wall tracking approximately 45%. Hypokinesis of distal   inferior, inferolateral, inferoseptal, and apical walls. Wall   motion visually appears similar to prior. Thickened LCC of aortic   valve appears similar to prior.  RHC/LHC 04/14/15  Prox LAD to Mid LAD lesion, 90% stenosed.  Ost Cx to Mid Cx lesion, 95% stenosed.  Ost 2nd Mrg to 2nd Mrg lesion, 80% stenosed.  Mid Cx lesion, 90% stenosed.  Prox RCA lesion, 60% stenosed.  Mid RCA lesion, 50% stenosed.  Recent Labs: 02/26/2019: ALT 20; B Natriuretic Peptide  1,190.5 03/06/2019: BUN 42; Creatinine, Ser 4.87; Hemoglobin 8.3; Platelets 133; Potassium 4.6; Sodium 135    Lipid Panel    Component Value Date/Time   CHOL 162 09/15/2018 0522   TRIG 51 09/15/2018 0522   HDL 45 09/15/2018 0522   CHOLHDL 3.6 09/15/2018 0522   VLDL 10 09/15/2018 0522   LDLCALC 107 (H) 09/15/2018 0522      Wt Readings from Last 3 Encounters:  05/31/19 152 lb (68.9 kg)  04/18/19 153 lb (69.4 kg)  03/14/19 155 lb (70.3 kg)      ASSESSMENT AND PLAN:  # Obstructive CAD: # Recurrent NSTEMI: # Hyperlipidemia: 3 vessel CAD that is medically managed.  She is not a good candidate for CABG or PCI.  She has chronic stable angina that is managed with 1-2 nitroglycerin every month.  Continue aspirin,  atorvastatin, and carvedilol.  # Hypertension:  BP poorly controlled.  Many of her antihypertensives were stopped when she started HD.  She is unsure what her BP usually runs but it does get low at HD at times.  I've asked her to track her BP.  She hasn't yet taken her morning medications.  Continue carvedilol.  ESRD on HD:  Ms. [place] is now on HD and tolerating it well.    # Prior CVA: Continue aspirin and statin.  # Chronic systolic and diastolic heart failure: LVEF 45-50%.  Continue carvedilol.  Volume managed with HD.  Time spent: 25 minutes-Greater than 50% of this time was spent in counseling, explanation of diagnosis, planning of further management, and coordination of care.  Current medicines are reviewed at length with the patient today.  The patient does not have concerns regarding medicines.  The following changes have been made:  None  Labs/ tests ordered today include:  No orders of the defined types were placed in this encounter.    Disposition:   FU with Sabastion Hrdlicka C. Oval Linsey, MD, [facility] or APP in 6 weeks.    Signed, Shalayah Beagley C. Oval Linsey, MD, [facility]  05/31/2019 9:32 AM    [employer]

## 2019-05-30 NOTE — Telephone Encounter (Signed)
Spoke with patient and Dr Oval Linsey will do telephone visit with patient tomorrow at 9:00 am

## 2019-05-30 NOTE — Telephone Encounter (Signed)
After reviewing patients appointment patient may have thought was telephone visit Left message to call back

## 2019-05-31 ENCOUNTER — Ambulatory Visit (HOSPITAL_COMMUNITY)
Admission: RE | Admit: 2019-05-31 | Discharge: 2019-05-31 | Disposition: A | Discharge status: Home / Self Care | Payer: Medicare Other | Source: Ambulatory Visit | Attending: Family | Admitting: Family

## 2019-05-31 ENCOUNTER — Encounter: Payer: Self-pay | Admitting: Family

## 2019-05-31 ENCOUNTER — Encounter: Payer: Self-pay | Admitting: Cardiovascular Disease

## 2019-05-31 ENCOUNTER — Other Ambulatory Visit: Payer: Self-pay

## 2019-05-31 ENCOUNTER — Encounter: Payer: Medicare Other | Admitting: Cardiovascular Disease

## 2019-05-31 ENCOUNTER — Ambulatory Visit (INDEPENDENT_AMBULATORY_CARE_PROVIDER_SITE_OTHER): Payer: Self-pay | Admitting: Family

## 2019-05-31 VITALS — BP 175/83 | HR 76 | Temp 98.1°F | Resp 12 | Ht 64.0 in | Wt 152.0 lb

## 2019-05-31 DIAGNOSIS — Z89611 Acquired absence of right leg above knee: Secondary | ICD-10-CM

## 2019-05-31 DIAGNOSIS — Z992 Dependence on renal dialysis: Secondary | ICD-10-CM

## 2019-05-31 DIAGNOSIS — N185 Chronic kidney disease, stage 5: Secondary | ICD-10-CM

## 2019-05-31 DIAGNOSIS — Z87891 Personal history of nicotine dependence: Secondary | ICD-10-CM

## 2019-05-31 DIAGNOSIS — N186 End stage renal disease: Secondary | ICD-10-CM

## 2019-05-31 DIAGNOSIS — I770 Arteriovenous fistula, acquired: Secondary | ICD-10-CM

## 2019-05-31 DIAGNOSIS — I779 Disorder of arteries and arterioles, unspecified: Secondary | ICD-10-CM

## 2019-05-31 MED ORDER — NITROGLYCERIN 0.4 MG SL SUBL: 0.4000 mg | SUBLINGUAL_TABLET | SUBLINGUAL | 99 refills | Status: DC | PRN

## 2019-05-31 NOTE — Patient Instructions (Addendum)
Medication Instructions:  Your physician recommends that you continue on your current medications as directed. Please refer to the Current Medication list given to you today.  If you need a refill on your cardiac medications before your next appointment, please call your pharmacy.   Lab work: NONE  Testing/Procedures: NONE  Follow-Up: Your physician recommends that you schedule a follow-up appointment in: 6 WEEKS 07/15/19 at 1:15  [name], DNP, ANP

## 2019-05-31 NOTE — Progress Notes (Signed)
2nd AVF duplex s/p creation of AVF on 03-04-19  History of Present Illness  Jenna Brown is a 68 y.o. (August 13, 1951) female whois s/p right upper arm (brachio-cephalic) AV fistula creation on 03-04-19 by Dr. Donnetta Hutching.   She dialyzes Mondays Wednesdays Fridays via right side chest TDC at [facility]. Pt sees Dr. Joesph July, nephrologist in [facility].  She denies any steal type symptoms in her right upper extremity.  She denies fever or chills  Phone encounter in July 2020: Will need another AVF duplex in 6 weeks; most depths are too deep, most diameters are too narrow.  Left ABI due about October 2020.  I advised follow up 6 weeks with right arm AVF duplex, see me or PA afterward.  I instructed her to squeeze a ball or rolled up washcloth with her right hand multiple times/day to help increase the diameters of the AVF.  She returns today for follow up AVF duplex.    She also underwent multiple attempts at revascularizationby Dr. Ileene Patrick her right leg for nonhealing wound. On 05/10/2012, she ultimately required a right above-knee amputation.  She denies any symptoms on her left leg, denies non healing wounds. She is performing daily seated leg exercises.   She had a stroke in the in the 1990's as manifested by left facial droop, difficulty speaking, no hemiparesis, no monocular loss of vision.   Her current prosthesis is heavy, states she does not use it since it is too heavy. She gets around in her w/c.   Pt hadbeen lost to follow up from 2016 until 07-02-18; states she felt she did not need to return, no testing was done since 2016until 07-10-18. Pt lives with her sister and mother.   She was hospitalized in August 2019, had an MI.  She is a retired Pharmacist, hospital.   Diabetic: Yes, last  A1C result on file was 6.8 on 01-11-18, improved form 8.2 Tobacco [id number] smoker, quitin 2013, started in her 20's  Pt meds include: Statin :Yes Betablocker:Yes  ASA:Yes Other anticoagulants/antiplatelets:Pt states Brilinta was stopped,states Jennye Moccasin caused her to feel like she was dying, did not eat when she was on [place]   Past Medical History:  Diagnosis Date  . Anemia   . Anxiety    takes Xanax prn  . Arthritis   . CKD (chronic kidney disease), stage IV ([place])   . Coronary artery disease 2016   cath w/ 90% LAD, 95%CFX, 80% OM 2, 60% RCA, not CABG candidate, rx medically  . GERD (gastroesophageal reflux disease)   . H/O hiatal hernia   . Headache(784.0)    when b/p is elevated  . Hemorrhoids   . Hx of AKA (above knee amputation), right ([place])   . Hyperlipidemia    takes Simvastatin daily  . Hypertension    takes Amlodipine/HCTZ/Losartan daily  . Migraines   . Myocardial infarction ([place]) 1990's  . NSTEMI (non-ST elevated myocardial infarction) ([place])   . Peripheral vascular disease ([place])   . PONV (postoperative nausea and vomiting)   . Stroke [facility])    TIA history  . Type II diabetes mellitus (HCC)    on Lantus  . Vertigo    takes Ativert prn    Social History Social History   Tobacco Use  . Smoking status: Former Smoker    Packs/day: 0.25    Years: 40.00    Pack years: 10.00    Types: Cigarettes    Quit date: 02/27/2012    Years since  quitting: 7.2  . Smokeless tobacco: Never Used  Substance Use Topics  . Alcohol use: No  . Drug use: No    Family History Family History  Problem Relation Age of Onset  . Cancer Mother        breast  . Diabetes Father   . Cancer Brother   . Heart attack Maternal Grandmother   . Heart attack Maternal Grandfather     Surgical History Past Surgical History:  Procedure Laterality Date  . ABDOMINAL AORTAGRAM N/A 02/29/2012   Procedure: ABDOMINAL Maxcine Ham;  Surgeon: [name], MD;  Location: [facility] CATH LAB;  Service: Cardiovascular;  Laterality: N/A;  . AMPUTATION  05/10/2012   Procedure: AMPUTATION ABOVE KNEE;  Surgeon: [name], MD;  Location: [place] OR;  Service:  Vascular;  Laterality: Right;   open right groin wound noted  . aortogram  02/2012  . AV FISTULA PLACEMENT Right 03/04/2019   Procedure: ARTERIOVENOUS (AV) FISTULA CREATION;  Surgeon: Rosetta Posner, MD;  Location: [place];  Service: Vascular;  Laterality: Right;  . CARDIAC CATHETERIZATION N/A 04/13/2015   Procedure: Right/Left Heart Cath and Coronary Angiography;  Surgeon: [name], MD;  Location: [place] CV LAB;  Service: Cardiovascular;  Laterality: N/A;  . CLEFT PALATE REPAIR     several  . COLONOSCOPY    . DILATION AND CURETTAGE OF UTERUS    . FEMORAL-POPLITEAL BYPASS GRAFT  04/05/2012   Procedure: BYPASS GRAFT FEMORAL-POPLITEAL ARTERY;  Surgeon: [name], MD;  Location: [place];  Service: Vascular;  Laterality: Right;  REVISION  . FEMORAL-TIBIAL BYPASS GRAFT  03/29/2012   Procedure: BYPASS GRAFT FEMORAL-TIBIAL ARTERY;  Surgeon: [name], MD;  Location: [place] OR;  Service: Vascular;  Laterality: Right;  Right femoral to Posterior Tibialis with composite graft of 31mm x 80 cm ringed gortex graft and saphenous vein  ,intraoperative arteriogram  . FEMOROPOPLITEAL THROMBECTOMY / EMBOLECTOMY  05/09/2012  . IR FLUORO GUIDE CV LINE RIGHT  03/01/2019  . IR US GUIDE VASC ACCESS RIGHT  03/01/2019  . MYOMECTOMY    . PR VEIN BYPASS GRAFT,AORTO-FEM-POP  05/27/11   Right SFA-Below knee Pop BP  . TEE WITHOUT CARDIOVERSION N/A 01/03/2017   Procedure: TRANSESOPHAGEAL ECHOCARDIOGRAM (TEE);  Surgeon: Skeet Latch, MD;  Location: [place];  Service: Cardiovascular;  Laterality: N/A;  . THORACENTESIS  02/27/2019      . TONSILLECTOMY    . TUBAL LIGATION  ~ 1990    Allergies  Allergen Reactions  . Plavix [Clopidogrel Bisulfate] Itching  . Codeine Other (See Comments)    "makes me feel strange"  . Tylenol With Codeine #3 [Acetaminophen-Codeine] Other (See Comments)    "doesn't make me feel right"    Current Outpatient Medications  Medication Sig Dispense Refill  . acetaminophen  (TYLENOL) 500 MG tablet Take 500 mg by mouth every 6 (six) hours as needed for mild pain.    Marland Kitchen ALPRAZolam (XANAX) 0.25 MG tablet Take 1 tablet (0.25 mg total) by mouth 3 (three) times daily as needed for anxiety. (Patient taking differently: Take 0.25 mg by mouth daily as needed for anxiety. ) 12 tablet 0  . aspirin 81 MG chewable tablet Chew 81 mg by mouth daily.    Marland Kitchen atorvastatin (LIPITOR) 80 MG tablet Take 80 mg by mouth daily.     . bisacodyl (DULCOLAX) 5 MG EC tablet Take 2 tablets (10 mg total) by mouth daily as needed for moderate constipation. 30 tablet 0  . carvedilol (COREG) 12.5  MG tablet Take 18.75 mg by mouth 2 (two) times daily with a meal. One and a half tablets twice daily.    . Cholecalciferol (VITAMIN D-3 PO) Take 4,000 Units by mouth daily.     . ferrous sulfate 325 (65 FE) MG tablet Take 957 mg by mouth daily with breakfast.     . insulin glargine (LANTUS) 100 UNIT/ML injection Inject 0.2 mLs (20 Units total) into the skin at bedtime. 10 mL 11  . multivitamin (RENA-VIT) TABS tablet Take 1 tablet by mouth at bedtime. 30 tablet 2  . nitroGLYCERIN (NITROSTAT) 0.4 MG SL tablet Place 1 tablet (0.4 mg total) under the tongue every 5 (five) minutes x 3 doses as needed for chest pain. 25 tablet prn  . polyethylene glycol (MIRALAX / GLYCOLAX) 17 g packet Take 17 g by mouth daily as needed for mild constipation. 14 each 0  . sodium bicarbonate 650 MG tablet Take 1,300 mg by mouth 2 (two) times daily.    . sodium polystyrene (KAYEXALATE) 15 GM/60ML suspension Take 7.5 mg by mouth once.     No current facility-administered medications for this visit.      REVIEW OF SYSTEMS: see HPI for pertinent positives and negatives    PHYSICAL EXAMINATION:  Vitals:   05/31/19 1321  BP: (!) 175/83  Pulse: 76  Resp: 12  Temp: 98.1 F (36.7 C)  TempSrc: Temporal  SpO2: 98%  Weight: 152 lb (68.9 kg)  Height: 5\' 4"  (1.626 m)   Body mass index is 26.09 kg/m.  General: The patient appears  her stated age, seated in w/c, accompanied by her care giver   HEENT:  No gross abnormalities Pulmonary: Respirations are non-labored Abdomen: Soft and non-tender  Musculoskeletal: There are no major deformities. Right AKA stump with no lesions.   Neurologic: No focal weakness or paresthesias are detected, bilateral hand grips strength is 4/5 Skin: There are no ulcer or rashes noted. Psychiatric: The patient has normal affect. Cardiovascular: There is a regular rate and rhythm. Bruit and thrill present at distal right upper arm AVF.  Right radial pulse is 1+ palpable.  Non-Invasive Vascular Imaging  (Right Arm AVF Duplex (05-31-19): Findings: +--------------------+----------+-----------------+--------+ AVF                 PSV (cm/s)Flow Vol (mL/min)Comments +--------------------+----------+-----------------+--------+ Native artery inflow   335          1741                +--------------------+----------+-----------------+--------+ AVF Anastomosis        870                              +--------------------+----------+-----------------+--------+ +------------+----------+-------------+----------+------------------------+ OUTFLOW VEINPSV (cm/s)Diameter (cm)Depth (cm)        Describe         +------------+----------+-------------+----------+------------------------+ Prox UA        136        0.65        1.36                            +------------+----------+-------------+----------+------------------------+ Mid UA         130        0.71        0.81                            +------------+----------+-------------+----------+------------------------+  Dist UA        126        0.70        0.60                            +------------+----------+-------------+----------+------------------------+ AC Fossa       229        0.83        0.13   post stenotic turbulence +------------+----------+-------------+----------+------------------------+  Summary: Patent arteriovenous fistula.  Velocities at the AVF anastomosis are elevated with some post stenotic turbulence at the distal upper arm segment of the cephalic vein.   (Right Arm AVF Duplex (04-18-19): Findings: +--------------------+----------+-----------------+--------+ AVF PSV (cm/s)Flow Vol (mL/min)Comments +--------------------+----------+-----------------+--------+ Native artery inflow 220  1236   +--------------------+----------+-----------------+--------+ AVF Anastomosis  770    +--------------------+----------+-----------------+--------+ +------------+----------+-------------+----------+----------------+ OUTFLOW VEINPSV (cm/s)Diameter (cm)Depth (cm) Describe  +------------+----------+-------------+----------+----------------+ Prox UA  134  0.58  1.25   +------------+----------+-------------+----------+----------------+ Mid UA  150  0.57  0.77   +------------+----------+-------------+----------+----------------+ Dist UA  250  0.85  0.22   +------------+----------+-------------+----------+----------------+ AC Fossa  633  0.18  0.69 competing branch +------------+----------+-------------+----------+----------------+ Summary: Patent right arterio-venous fistula with increased velocity at the anastomosis and in the outflow vein at area of slight narrowing / calliber of vein ( approximately .61 cms in length).    Left ABI (07-10-18): ABI Findings: +--------+------------------+-----+--------+--------+ Right Rt Pressure (mmHg)IndexWaveformComment  +--------+------------------+-----+--------+--------+ [id number]     +--------+------------------+-----+--------+--------+   +---------+------------------+-----+----------+-------+ Left Lt Pressure (mmHg)IndexWaveform Comment +---------+------------------+-----+----------+-------+ Brachial 172     +---------+------------------+-----+----------+-------+ ATA 126 0.70 monophasic  +---------+------------------+-----+----------+-------+ PTA 119 0.66 monophasic  +---------+------------------+-----+----------+-------+ Great Toe66 0.37    +---------+------------------+-----+----------+-------+  +-------+-----------+-----------+------------+------------+ ABI/TBIToday's ABIToday's TBIPrevious ABIPrevious TBI +-------+-----------+-----------+------------+------------+ Right Amputation  Amputation   +-------+-----------+-----------+------------+------------+ Left 0.70 0.37 0.96 0.72  +-------+-----------+-----------+------------+------------+  Left ABIs appear decreased compared to prior study on 01/02/2015.  Summary: Left: Resting left ankle-brachial index indicates moderate left lower extremity arterial disease. The left toe-brachial index is abnormal. LT Great toe pressure = 66 mmHg.    Medical Decision Making  Jenna Brown is a 68 y.o. female whois s/p right upper arm (brachio-cephalic) AV fistula creation on 03-04-19 by Dr. Donnetta Hutching.   She also underwent multiple attempts at revascularizationby Dr. Ileene Patrick her right leg for nonhealing wound. On 05/10/2012, she ultimately required a right above-knee amputation.  She denies any symptoms on her left leg, denies non healing wounds. She is performing daily seated leg exercises.    Diameters are now large enough, distal depths are shallow enough, proximal depths are deeper than 0.6 cm. I spoke with Dr. Donzetta Matters, dialysis center may now access right  upper arm AVF.  Distal cannulation may be more successful than proximal.  If there is difficulty cannulating, may need to schedule superficialization.  It has been 3 months since the AVF creation.   Left ABI in 1 month   Clemon Chambers, RN, MSN, FNP-C [facility] Office: [phone]  05/31/2019, 1:53 PM  Clinic MD: Donzetta Matters

## 2019-06-01 DIAGNOSIS — N2581 Secondary hyperparathyroidism of renal origin: Secondary | ICD-10-CM | POA: Diagnosis not present

## 2019-06-01 DIAGNOSIS — D509 Iron deficiency anemia, unspecified: Secondary | ICD-10-CM | POA: Diagnosis not present

## 2019-06-01 DIAGNOSIS — D631 Anemia in chronic kidney disease: Secondary | ICD-10-CM | POA: Diagnosis not present

## 2019-06-01 DIAGNOSIS — E1121 Type 2 diabetes mellitus with diabetic nephropathy: Secondary | ICD-10-CM | POA: Diagnosis not present

## 2019-06-01 DIAGNOSIS — N186 End stage renal disease: Secondary | ICD-10-CM | POA: Diagnosis not present

## 2019-06-03 ENCOUNTER — Telehealth: Payer: Self-pay | Admitting: *Deleted

## 2019-06-03 DIAGNOSIS — D631 Anemia in chronic kidney disease: Secondary | ICD-10-CM | POA: Diagnosis not present

## 2019-06-03 DIAGNOSIS — E1121 Type 2 diabetes mellitus with diabetic nephropathy: Secondary | ICD-10-CM | POA: Diagnosis not present

## 2019-06-03 DIAGNOSIS — N2581 Secondary hyperparathyroidism of renal origin: Secondary | ICD-10-CM | POA: Diagnosis not present

## 2019-06-03 DIAGNOSIS — N186 End stage renal disease: Secondary | ICD-10-CM | POA: Diagnosis not present

## 2019-06-03 DIAGNOSIS — D509 Iron deficiency anemia, unspecified: Secondary | ICD-10-CM | POA: Diagnosis not present

## 2019-06-03 NOTE — Telephone Encounter (Signed)
Left message to call back and go over AVS from 9/11 virtual visit. Have mailed to patient

## 2019-06-04 NOTE — Telephone Encounter (Signed)
Reviewed AVS and follow up appointment date/time

## 2019-06-05 DIAGNOSIS — N2581 Secondary hyperparathyroidism of renal origin: Secondary | ICD-10-CM | POA: Diagnosis not present

## 2019-06-05 DIAGNOSIS — E1121 Type 2 diabetes mellitus with diabetic nephropathy: Secondary | ICD-10-CM | POA: Diagnosis not present

## 2019-06-05 DIAGNOSIS — D509 Iron deficiency anemia, unspecified: Secondary | ICD-10-CM | POA: Diagnosis not present

## 2019-06-05 DIAGNOSIS — D631 Anemia in chronic kidney disease: Secondary | ICD-10-CM | POA: Diagnosis not present

## 2019-06-05 DIAGNOSIS — N186 End stage renal disease: Secondary | ICD-10-CM | POA: Diagnosis not present

## 2019-06-07 DIAGNOSIS — E1121 Type 2 diabetes mellitus with diabetic nephropathy: Secondary | ICD-10-CM | POA: Diagnosis not present

## 2019-06-07 DIAGNOSIS — N2581 Secondary hyperparathyroidism of renal origin: Secondary | ICD-10-CM | POA: Diagnosis not present

## 2019-06-07 DIAGNOSIS — N186 End stage renal disease: Secondary | ICD-10-CM | POA: Diagnosis not present

## 2019-06-07 DIAGNOSIS — D631 Anemia in chronic kidney disease: Secondary | ICD-10-CM | POA: Diagnosis not present

## 2019-06-07 DIAGNOSIS — D509 Iron deficiency anemia, unspecified: Secondary | ICD-10-CM | POA: Diagnosis not present

## 2019-06-10 DIAGNOSIS — N186 End stage renal disease: Secondary | ICD-10-CM | POA: Diagnosis not present

## 2019-06-10 DIAGNOSIS — D509 Iron deficiency anemia, unspecified: Secondary | ICD-10-CM | POA: Diagnosis not present

## 2019-06-10 DIAGNOSIS — E1121 Type 2 diabetes mellitus with diabetic nephropathy: Secondary | ICD-10-CM | POA: Diagnosis not present

## 2019-06-10 DIAGNOSIS — N2581 Secondary hyperparathyroidism of renal origin: Secondary | ICD-10-CM | POA: Diagnosis not present

## 2019-06-10 DIAGNOSIS — D631 Anemia in chronic kidney disease: Secondary | ICD-10-CM | POA: Diagnosis not present

## 2019-06-12 DIAGNOSIS — D509 Iron deficiency anemia, unspecified: Secondary | ICD-10-CM | POA: Diagnosis not present

## 2019-06-12 DIAGNOSIS — N2581 Secondary hyperparathyroidism of renal origin: Secondary | ICD-10-CM | POA: Diagnosis not present

## 2019-06-12 DIAGNOSIS — D631 Anemia in chronic kidney disease: Secondary | ICD-10-CM | POA: Diagnosis not present

## 2019-06-12 DIAGNOSIS — N186 End stage renal disease: Secondary | ICD-10-CM | POA: Diagnosis not present

## 2019-06-12 DIAGNOSIS — E1121 Type 2 diabetes mellitus with diabetic nephropathy: Secondary | ICD-10-CM | POA: Diagnosis not present

## 2019-06-14 DIAGNOSIS — N186 End stage renal disease: Secondary | ICD-10-CM | POA: Diagnosis not present

## 2019-06-14 DIAGNOSIS — D509 Iron deficiency anemia, unspecified: Secondary | ICD-10-CM | POA: Diagnosis not present

## 2019-06-14 DIAGNOSIS — E1121 Type 2 diabetes mellitus with diabetic nephropathy: Secondary | ICD-10-CM | POA: Diagnosis not present

## 2019-06-14 DIAGNOSIS — D631 Anemia in chronic kidney disease: Secondary | ICD-10-CM | POA: Diagnosis not present

## 2019-06-14 DIAGNOSIS — N2581 Secondary hyperparathyroidism of renal origin: Secondary | ICD-10-CM | POA: Diagnosis not present

## 2019-06-16 NOTE — Progress Notes (Signed)
This encounter was created in error - please disregard.

## 2019-06-17 DIAGNOSIS — D631 Anemia in chronic kidney disease: Secondary | ICD-10-CM | POA: Diagnosis not present

## 2019-06-17 DIAGNOSIS — N2581 Secondary hyperparathyroidism of renal origin: Secondary | ICD-10-CM | POA: Diagnosis not present

## 2019-06-17 DIAGNOSIS — D509 Iron deficiency anemia, unspecified: Secondary | ICD-10-CM | POA: Diagnosis not present

## 2019-06-17 DIAGNOSIS — E1121 Type 2 diabetes mellitus with diabetic nephropathy: Secondary | ICD-10-CM | POA: Diagnosis not present

## 2019-06-17 DIAGNOSIS — N186 End stage renal disease: Secondary | ICD-10-CM | POA: Diagnosis not present

## 2019-06-18 ENCOUNTER — Telehealth: Payer: Self-pay | Admitting: *Deleted

## 2019-06-18 NOTE — Telephone Encounter (Signed)
Return call to patient who left message on triage line. No answer left message to call this office on voice mail.

## 2019-06-19 ENCOUNTER — Telehealth: Payer: Self-pay | Admitting: *Deleted

## 2019-06-19 DIAGNOSIS — D631 Anemia in chronic kidney disease: Secondary | ICD-10-CM | POA: Diagnosis not present

## 2019-06-19 DIAGNOSIS — Z992 Dependence on renal dialysis: Secondary | ICD-10-CM | POA: Diagnosis not present

## 2019-06-19 DIAGNOSIS — E1121 Type 2 diabetes mellitus with diabetic nephropathy: Secondary | ICD-10-CM | POA: Diagnosis not present

## 2019-06-19 DIAGNOSIS — N2581 Secondary hyperparathyroidism of renal origin: Secondary | ICD-10-CM | POA: Diagnosis not present

## 2019-06-19 DIAGNOSIS — N186 End stage renal disease: Secondary | ICD-10-CM | POA: Diagnosis not present

## 2019-06-19 DIAGNOSIS — D509 Iron deficiency anemia, unspecified: Secondary | ICD-10-CM | POA: Diagnosis not present

## 2019-06-19 NOTE — Telephone Encounter (Signed)
Call from [facility] at [place] to have patient checked due to ulcer on middle finger right hand noticed on 06/17/2019 after HD treatment. Appt given to see NP on 06/20/2019. Amy will give appt time to patient.

## 2019-06-20 ENCOUNTER — Encounter: Payer: Self-pay | Admitting: Family

## 2019-06-20 ENCOUNTER — Ambulatory Visit (INDEPENDENT_AMBULATORY_CARE_PROVIDER_SITE_OTHER): Payer: Medicare Other | Admitting: Family

## 2019-06-20 ENCOUNTER — Other Ambulatory Visit: Payer: Self-pay

## 2019-06-20 VITALS — BP 152/79 | HR 78 | Temp 97.9°F | Resp 14 | Ht 64.0 in | Wt 152.0 lb

## 2019-06-20 DIAGNOSIS — I770 Arteriovenous fistula, acquired: Secondary | ICD-10-CM | POA: Diagnosis not present

## 2019-06-20 DIAGNOSIS — Z992 Dependence on renal dialysis: Secondary | ICD-10-CM | POA: Diagnosis not present

## 2019-06-20 DIAGNOSIS — I779 Disorder of arteries and arterioles, unspecified: Secondary | ICD-10-CM | POA: Diagnosis not present

## 2019-06-20 DIAGNOSIS — S60422A Blister (nonthermal) of right middle finger, initial encounter: Secondary | ICD-10-CM

## 2019-06-20 DIAGNOSIS — Z89611 Acquired absence of right leg above knee: Secondary | ICD-10-CM | POA: Diagnosis not present

## 2019-06-20 DIAGNOSIS — Z87891 Personal history of nicotine dependence: Secondary | ICD-10-CM

## 2019-06-20 DIAGNOSIS — N186 End stage renal disease: Secondary | ICD-10-CM

## 2019-06-20 NOTE — Progress Notes (Signed)
CC: healing non traumatic blister at tip of right 3rd finger, ESRD on hemodialysis, AVF  History of Present Illness  Jenna Brown is a 68 y.o. (12/21/1950) female whois s/p right upper arm (brachio-cephalic) AV fistula creationon 03-04-19 by Dr. Donnetta Hutching.   She returns today for evaluation of AVF as it may relate to healing blister at tip of right 3rd finger. She developed this blister less than a week ago, states it was pink and draining, is now dark and not draining. She denies any pain in this finger, denies any steal type symptoms. She denies any known injury to this finger.  Her right upper arm AVF started to be cannulized about 3 weeks ago, with one needle in the AVF, the other at her right IJ TDC. The last HD session only the [facility] was used.   She dialyzesMondays Wednesdays Fridaysvia [facility] [address] on [address]Pt sees Dr. Joesph July, nephrologist in [facility].  She denies any steal type symptoms in her right upper extremity.  She denies fever or chills  Phone encounter in July 2020: Will need another AVF duplex in 6 weeks; most depths are too deep, most diameters are too narrow.  Left ABI due about October 2020. I advised follow up6 weeks with right arm AVF duplex, see me or PA afterward.  I instructed her to squeeze a ball or rolled up washcloth with her right hand multiple times/day to help increase the diameters of the AVF.  When I saw her on 05-31-19: She alsounderwent multiple attempts at revascularizationby Dr. Ileene Patrick her right leg for nonhealing wound. On 05/10/2012, she ultimately required a right above-knee amputation.  She denies any symptoms on her left leg, denies non healing wounds. She is performing daily seated leg exercises. Diameters were large enough, distal depths were shallow enough, proximal depths were deeper than 0.6 cm. I spoke with Dr. Donzetta Matters, dialysis center may access right upper  arm AVF.  Distal cannulation may be more successful than proximal.  If there is difficulty cannulating, may need to schedule superficialization.  It had been 3 months since the AVF creation.  Left ABI in 1 month.  She alsounderwent multiple attempts at revascularizationby Dr. Ileene Patrick her right leg for nonhealing wound. On 05/10/2012, she ultimately required a right above-knee amputation.  She denies any symptoms on her left leg, denies non healing wounds. She is performing daily seated leg exercises.  She had a stroke in the in the 1990's as manifested by left facial droop, difficulty speaking, no hemiparesis, no monocular loss of vision.   Her current prosthesis is heavy, states she does not use it since it is too heavy.She gets around in her w/c.  Pt hadbeen lost to follow up from 2016 until 07-02-18; states she felt she did not need to return, no testing was done since 2016until 07-10-18. Pt lives with her sister and mother.   She was hospitalized in August 2019, had an MI.  She is a retired Pharmacist, hospital.  Diabetic: Yes, lastA1C result on file was 6.8 on 01-11-18, improved form 8.2 Tobacco [id number] smoker, quitin 2013, started in her 20's  Pt meds include: Statin :Yes Betablocker:Yes ASA:Yes Other anticoagulants/antiplatelets:Pt statesBrilintawas stopped,states Jennye Moccasin caused her to feel like she was dying, did not eat when she was on [place]   Past Medical History:  Diagnosis Date  . Anemia   . Anxiety    takes Xanax prn  . Arthritis   .  CKD (chronic kidney disease), stage IV ([place])   . Coronary artery disease 2016   cath w/ 90% LAD, 95%CFX, 80% OM 2, 60% RCA, not CABG candidate, rx medically  . GERD (gastroesophageal reflux disease)   . H/O hiatal hernia   . Headache(784.0)    when b/p is elevated  . Hemorrhoids   . Hx of AKA (above knee amputation), right ([employer])   . Hyperlipidemia    takes Simvastatin daily  . Hypertension    takes  Amlodipine/HCTZ/Losartan daily  . Migraines   . Myocardial infarction ([place]) 1990's  . NSTEMI (non-ST elevated myocardial infarction) ([place])   . Peripheral vascular disease ([place])   . PONV (postoperative nausea and vomiting)   . Stroke [facility])    TIA history  . Type II diabetes mellitus (HCC)    on Lantus  . Vertigo    takes Ativert prn    Social History Social History   Tobacco Use  . Smoking status: Former Smoker    Packs/day: 0.25    Years: 40.00    Pack years: 10.00    Types: Cigarettes    Quit date: 02/27/2012    Years since quitting: 7.3  . Smokeless tobacco: Never Used  Substance Use Topics  . Alcohol use: No  . Drug use: No    Family History Family History  Problem Relation Age of Onset  . Cancer Mother        breast  . Diabetes Father   . Cancer Brother   . Heart attack Maternal Grandmother   . Heart attack Maternal Grandfather     Surgical History Past Surgical History:  Procedure Laterality Date  . ABDOMINAL AORTAGRAM N/A 02/29/2012   Procedure: ABDOMINAL Maxcine Ham;  Surgeon: [name], MD;  Location: [facility] CATH LAB;  Service: Cardiovascular;  Laterality: N/A;  . AMPUTATION  05/10/2012   Procedure: AMPUTATION ABOVE KNEE;  Surgeon: [name], MD;  Location: [place] OR;  Service: Vascular;  Laterality: Right;   open right groin wound noted  . aortogram  02/2012  . AV FISTULA PLACEMENT Right 03/04/2019   Procedure: ARTERIOVENOUS (AV) FISTULA CREATION;  Surgeon: Rosetta Posner, MD;  Location: [place];  Service: Vascular;  Laterality: Right;  . CARDIAC CATHETERIZATION N/A 04/13/2015   Procedure: Right/Left Heart Cath and Coronary Angiography;  Surgeon: [name], MD;  Location: [place] CV LAB;  Service: Cardiovascular;  Laterality: N/A;  . CLEFT PALATE REPAIR     several  . COLONOSCOPY    . DILATION AND CURETTAGE OF UTERUS    . FEMORAL-POPLITEAL BYPASS GRAFT  04/05/2012   Procedure: BYPASS GRAFT FEMORAL-POPLITEAL ARTERY;  Surgeon: [name], MD;   Location: [place];  Service: Vascular;  Laterality: Right;  REVISION  . FEMORAL-TIBIAL BYPASS GRAFT  03/29/2012   Procedure: BYPASS GRAFT FEMORAL-TIBIAL ARTERY;  Surgeon: [name], MD;  Location: [place] OR;  Service: Vascular;  Laterality: Right;  Right femoral to Posterior Tibialis with composite graft of 33mm x 80 cm ringed gortex graft and saphenous vein  ,intraoperative arteriogram  . FEMOROPOPLITEAL THROMBECTOMY / EMBOLECTOMY  05/09/2012  . IR FLUORO GUIDE CV LINE RIGHT  03/01/2019  . IR US GUIDE VASC ACCESS RIGHT  03/01/2019  . MYOMECTOMY    . PR VEIN BYPASS GRAFT,AORTO-FEM-POP  05/27/11   Right SFA-Below knee Pop BP  . TEE WITHOUT CARDIOVERSION N/A 01/03/2017   Procedure: TRANSESOPHAGEAL ECHOCARDIOGRAM (TEE);  Surgeon: Skeet Latch, MD;  Location: [place];  Service: Cardiovascular;  Laterality: N/A;  .  THORACENTESIS  02/27/2019      . TONSILLECTOMY    . TUBAL LIGATION  ~ 1990    Allergies  Allergen Reactions  . Plavix [Clopidogrel Bisulfate] Itching  . Codeine Other (See Comments)    "makes me feel strange"  . Tylenol With Codeine #3 [Acetaminophen-Codeine] Other (See Comments)    "doesn't make me feel right"    Current Outpatient Medications  Medication Sig Dispense Refill  . acetaminophen (TYLENOL) 500 MG tablet Take 500 mg by mouth every 6 (six) hours as needed for mild pain.    Marland Kitchen ALPRAZolam (XANAX) 0.25 MG tablet Take 1 tablet (0.25 mg total) by mouth 3 (three) times daily as needed for anxiety. (Patient taking differently: Take 0.25 mg by mouth daily as needed for anxiety. ) 12 tablet 0  . aspirin 81 MG chewable tablet Chew 81 mg by mouth daily.    Marland Kitchen atorvastatin (LIPITOR) 80 MG tablet Take 80 mg by mouth daily.     . bisacodyl (DULCOLAX) 5 MG EC tablet Take 2 tablets (10 mg total) by mouth daily as needed for moderate constipation. 30 tablet 0  . carvedilol (COREG) 12.5 MG tablet Take 18.75 mg by mouth 2 (two) times daily with a meal. One and a half tablets twice daily.     . Cholecalciferol (VITAMIN D-3 PO) Take 4,000 Units by mouth daily.     . ferrous sulfate 325 (65 FE) MG tablet Take 957 mg by mouth daily with breakfast.     . insulin glargine (LANTUS) 100 UNIT/ML injection Inject 0.2 mLs (20 Units total) into the skin at bedtime. 10 mL 11  . multivitamin (RENA-VIT) TABS tablet Take 1 tablet by mouth at bedtime. 30 tablet 2  . nitroGLYCERIN (NITROSTAT) 0.4 MG SL tablet Place 1 tablet (0.4 mg total) under the tongue every 5 (five) minutes x 3 doses as needed for chest pain. 25 tablet prn  . polyethylene glycol (MIRALAX / GLYCOLAX) 17 g packet Take 17 g by mouth daily as needed for mild constipation. 14 each 0  . sodium bicarbonate 650 MG tablet Take 1,300 mg by mouth 2 (two) times daily.    . sodium polystyrene (KAYEXALATE) 15 GM/60ML suspension Take 7.5 mg by mouth once.     No current facility-administered medications for this visit.      REVIEW OF SYSTEMS: see HPI for pertinent positives and negatives    PHYSICAL EXAMINATION:  Vitals:   06/20/19 1317  BP: (!) 152/79  Pulse: 78  Resp: 14  Temp: 97.9 F (36.6 C)  TempSrc: Temporal  SpO2: 97%  Weight: 152 lb (68.9 kg)  Height: 5\' 4"  (1.626 m)   Body mass index is 26.09 kg/m.  General: Female seated in w/c appears older than her stated age 21:  No gross abnormalities Pulmonary: Respirations are non-labored, distant breath sounds, no rales, rhonchi, or wheezes Abdomen: Soft and non-tender with normal bowel sounds. Musculoskeletal:  Right AKA well healed Neurologic: No focal weakness or paresthesias are detected, bilateral hand grips strength is 4/5 Skin: There are no rashes noted. See photo below Psychiatric: The patient has normal affect. Cardiovascular: There is a regular rate and rhythm without significant murmur appreciated.  Right upper arm AVF with thrill and bruit at the distal portion only. Some ecchymosis at the mid portion of the AVF. Right IJ TDC in place Faintly palpable  right radial pulse, ulnar pulse is not palpable   Healing blister at tip of right 3rd finger   Non-Invasive Vascular Imaging  none   Medical Decision Making  Jenna Brown is a 68 y.o. female whois s/p right upper arm (brachio-cephalic) AV fistula creationon 03-04-19 by Dr. Donnetta Hutching.   She developed a blister, no known trauma, at the tip of her right 3rd finger, less than a week ago. 3 weeks ago right upper arm AVF started being cannulated. There has been difficulty with cannulation, and the HD center most recently used her right IJ [facility] for HD.   Pt denies any pain in her finger or hand, denies any steal type symptoms.   Dr. Oneida Alar spoke with pt and sister. Will advise pt HD center to NOT USE AVF until she returns to see [place] in 2-3 weeks for check of her right 3rd fingertip. Only use right IJ TDC for dialysis until further notice. Want to see if her finger heals, if so, AVF may need superficialization before using again. If signs of ischemia are apparent in her right hand/fingers in 2-3 weeks, may need to ligate the AVF and search for another permanent access.   Clemon Chambers, RN, MSN, FNP-C [facility] [place] Office: [phone]  06/20/2019, 1:20 PM  Clinic MD: Laqueta Due

## 2019-06-21 DIAGNOSIS — E785 Hyperlipidemia, unspecified: Secondary | ICD-10-CM | POA: Diagnosis not present

## 2019-06-21 DIAGNOSIS — D631 Anemia in chronic kidney disease: Secondary | ICD-10-CM | POA: Diagnosis not present

## 2019-06-21 DIAGNOSIS — N186 End stage renal disease: Secondary | ICD-10-CM | POA: Diagnosis not present

## 2019-06-21 DIAGNOSIS — N2581 Secondary hyperparathyroidism of renal origin: Secondary | ICD-10-CM | POA: Diagnosis not present

## 2019-06-21 DIAGNOSIS — D509 Iron deficiency anemia, unspecified: Secondary | ICD-10-CM | POA: Diagnosis not present

## 2019-06-21 DIAGNOSIS — E1121 Type 2 diabetes mellitus with diabetic nephropathy: Secondary | ICD-10-CM | POA: Diagnosis not present

## 2019-06-24 DIAGNOSIS — N2581 Secondary hyperparathyroidism of renal origin: Secondary | ICD-10-CM | POA: Diagnosis not present

## 2019-06-24 DIAGNOSIS — E785 Hyperlipidemia, unspecified: Secondary | ICD-10-CM | POA: Diagnosis not present

## 2019-06-24 DIAGNOSIS — D509 Iron deficiency anemia, unspecified: Secondary | ICD-10-CM | POA: Diagnosis not present

## 2019-06-24 DIAGNOSIS — N186 End stage renal disease: Secondary | ICD-10-CM | POA: Diagnosis not present

## 2019-06-24 DIAGNOSIS — E1121 Type 2 diabetes mellitus with diabetic nephropathy: Secondary | ICD-10-CM | POA: Diagnosis not present

## 2019-06-24 DIAGNOSIS — D631 Anemia in chronic kidney disease: Secondary | ICD-10-CM | POA: Diagnosis not present

## 2019-06-26 DIAGNOSIS — D631 Anemia in chronic kidney disease: Secondary | ICD-10-CM | POA: Diagnosis not present

## 2019-06-26 DIAGNOSIS — E1121 Type 2 diabetes mellitus with diabetic nephropathy: Secondary | ICD-10-CM | POA: Diagnosis not present

## 2019-06-26 DIAGNOSIS — N2581 Secondary hyperparathyroidism of renal origin: Secondary | ICD-10-CM | POA: Diagnosis not present

## 2019-06-26 DIAGNOSIS — E785 Hyperlipidemia, unspecified: Secondary | ICD-10-CM | POA: Diagnosis not present

## 2019-06-26 DIAGNOSIS — N186 End stage renal disease: Secondary | ICD-10-CM | POA: Diagnosis not present

## 2019-06-26 DIAGNOSIS — D509 Iron deficiency anemia, unspecified: Secondary | ICD-10-CM | POA: Diagnosis not present

## 2019-06-28 DIAGNOSIS — Z1389 Encounter for screening for other disorder: Secondary | ICD-10-CM | POA: Diagnosis not present

## 2019-06-28 DIAGNOSIS — Z131 Encounter for screening for diabetes mellitus: Secondary | ICD-10-CM | POA: Diagnosis not present

## 2019-06-28 DIAGNOSIS — Z1329 Encounter for screening for other suspected endocrine disorder: Secondary | ICD-10-CM | POA: Diagnosis not present

## 2019-06-28 DIAGNOSIS — Z01021 Encounter for examination of eyes and vision following failed vision screening with abnormal findings: Secondary | ICD-10-CM | POA: Diagnosis not present

## 2019-06-28 DIAGNOSIS — Z5181 Encounter for therapeutic drug level monitoring: Secondary | ICD-10-CM | POA: Diagnosis not present

## 2019-06-28 DIAGNOSIS — Z01118 Encounter for examination of ears and hearing with other abnormal findings: Secondary | ICD-10-CM | POA: Diagnosis not present

## 2019-06-29 DIAGNOSIS — N186 End stage renal disease: Secondary | ICD-10-CM | POA: Diagnosis not present

## 2019-06-29 DIAGNOSIS — E1121 Type 2 diabetes mellitus with diabetic nephropathy: Secondary | ICD-10-CM | POA: Diagnosis not present

## 2019-06-29 DIAGNOSIS — D509 Iron deficiency anemia, unspecified: Secondary | ICD-10-CM | POA: Diagnosis not present

## 2019-06-29 DIAGNOSIS — D631 Anemia in chronic kidney disease: Secondary | ICD-10-CM | POA: Diagnosis not present

## 2019-06-29 DIAGNOSIS — E785 Hyperlipidemia, unspecified: Secondary | ICD-10-CM | POA: Diagnosis not present

## 2019-06-29 DIAGNOSIS — N2581 Secondary hyperparathyroidism of renal origin: Secondary | ICD-10-CM | POA: Diagnosis not present

## 2019-07-01 DIAGNOSIS — D509 Iron deficiency anemia, unspecified: Secondary | ICD-10-CM | POA: Diagnosis not present

## 2019-07-01 DIAGNOSIS — N2581 Secondary hyperparathyroidism of renal origin: Secondary | ICD-10-CM | POA: Diagnosis not present

## 2019-07-01 DIAGNOSIS — N186 End stage renal disease: Secondary | ICD-10-CM | POA: Diagnosis not present

## 2019-07-01 DIAGNOSIS — E785 Hyperlipidemia, unspecified: Secondary | ICD-10-CM | POA: Diagnosis not present

## 2019-07-01 DIAGNOSIS — E1121 Type 2 diabetes mellitus with diabetic nephropathy: Secondary | ICD-10-CM | POA: Diagnosis not present

## 2019-07-01 DIAGNOSIS — D631 Anemia in chronic kidney disease: Secondary | ICD-10-CM | POA: Diagnosis not present

## 2019-07-03 DIAGNOSIS — E785 Hyperlipidemia, unspecified: Secondary | ICD-10-CM | POA: Diagnosis not present

## 2019-07-03 DIAGNOSIS — E1121 Type 2 diabetes mellitus with diabetic nephropathy: Secondary | ICD-10-CM | POA: Diagnosis not present

## 2019-07-03 DIAGNOSIS — D631 Anemia in chronic kidney disease: Secondary | ICD-10-CM | POA: Diagnosis not present

## 2019-07-03 DIAGNOSIS — N2581 Secondary hyperparathyroidism of renal origin: Secondary | ICD-10-CM | POA: Diagnosis not present

## 2019-07-03 DIAGNOSIS — N186 End stage renal disease: Secondary | ICD-10-CM | POA: Diagnosis not present

## 2019-07-03 DIAGNOSIS — D509 Iron deficiency anemia, unspecified: Secondary | ICD-10-CM | POA: Diagnosis not present

## 2019-07-04 ENCOUNTER — Encounter (HOSPITAL_COMMUNITY): Payer: Medicare Other

## 2019-07-04 ENCOUNTER — Ambulatory Visit: Payer: Medicare Other | Admitting: Family

## 2019-07-05 DIAGNOSIS — N2581 Secondary hyperparathyroidism of renal origin: Secondary | ICD-10-CM | POA: Diagnosis not present

## 2019-07-05 DIAGNOSIS — D631 Anemia in chronic kidney disease: Secondary | ICD-10-CM | POA: Diagnosis not present

## 2019-07-05 DIAGNOSIS — E785 Hyperlipidemia, unspecified: Secondary | ICD-10-CM | POA: Diagnosis not present

## 2019-07-05 DIAGNOSIS — N186 End stage renal disease: Secondary | ICD-10-CM | POA: Diagnosis not present

## 2019-07-05 DIAGNOSIS — D509 Iron deficiency anemia, unspecified: Secondary | ICD-10-CM | POA: Diagnosis not present

## 2019-07-05 DIAGNOSIS — E1121 Type 2 diabetes mellitus with diabetic nephropathy: Secondary | ICD-10-CM | POA: Diagnosis not present

## 2019-07-08 DIAGNOSIS — E1121 Type 2 diabetes mellitus with diabetic nephropathy: Secondary | ICD-10-CM | POA: Diagnosis not present

## 2019-07-08 DIAGNOSIS — E785 Hyperlipidemia, unspecified: Secondary | ICD-10-CM | POA: Diagnosis not present

## 2019-07-08 DIAGNOSIS — N186 End stage renal disease: Secondary | ICD-10-CM | POA: Diagnosis not present

## 2019-07-08 DIAGNOSIS — D509 Iron deficiency anemia, unspecified: Secondary | ICD-10-CM | POA: Diagnosis not present

## 2019-07-08 DIAGNOSIS — N2581 Secondary hyperparathyroidism of renal origin: Secondary | ICD-10-CM | POA: Diagnosis not present

## 2019-07-08 DIAGNOSIS — D631 Anemia in chronic kidney disease: Secondary | ICD-10-CM | POA: Diagnosis not present

## 2019-07-10 ENCOUNTER — Telehealth (HOSPITAL_COMMUNITY): Payer: Self-pay

## 2019-07-10 ENCOUNTER — Other Ambulatory Visit: Payer: Self-pay

## 2019-07-10 DIAGNOSIS — E1121 Type 2 diabetes mellitus with diabetic nephropathy: Secondary | ICD-10-CM | POA: Diagnosis not present

## 2019-07-10 DIAGNOSIS — E785 Hyperlipidemia, unspecified: Secondary | ICD-10-CM | POA: Diagnosis not present

## 2019-07-10 DIAGNOSIS — D509 Iron deficiency anemia, unspecified: Secondary | ICD-10-CM | POA: Diagnosis not present

## 2019-07-10 DIAGNOSIS — N2581 Secondary hyperparathyroidism of renal origin: Secondary | ICD-10-CM | POA: Diagnosis not present

## 2019-07-10 DIAGNOSIS — I779 Disorder of arteries and arterioles, unspecified: Secondary | ICD-10-CM

## 2019-07-10 DIAGNOSIS — D631 Anemia in chronic kidney disease: Secondary | ICD-10-CM | POA: Diagnosis not present

## 2019-07-10 DIAGNOSIS — N186 End stage renal disease: Secondary | ICD-10-CM | POA: Diagnosis not present

## 2019-07-10 NOTE — Telephone Encounter (Signed)

## 2019-07-11 ENCOUNTER — Ambulatory Visit (INDEPENDENT_AMBULATORY_CARE_PROVIDER_SITE_OTHER): Payer: Medicare Other | Admitting: Family

## 2019-07-11 ENCOUNTER — Ambulatory Visit (HOSPITAL_COMMUNITY)
Admission: RE | Admit: 2019-07-11 | Discharge: 2019-07-11 | Disposition: A | Discharge status: Home / Self Care | Payer: Medicare Other | Source: Ambulatory Visit | Attending: Family | Admitting: Family

## 2019-07-11 ENCOUNTER — Encounter: Payer: Self-pay | Admitting: Family

## 2019-07-11 ENCOUNTER — Other Ambulatory Visit: Payer: Self-pay

## 2019-07-11 ENCOUNTER — Ambulatory Visit: Payer: Medicare Other

## 2019-07-11 DIAGNOSIS — Z87891 Personal history of nicotine dependence: Secondary | ICD-10-CM | POA: Diagnosis not present

## 2019-07-11 DIAGNOSIS — I779 Disorder of arteries and arterioles, unspecified: Secondary | ICD-10-CM

## 2019-07-11 DIAGNOSIS — Z89611 Acquired absence of right leg above knee: Secondary | ICD-10-CM | POA: Diagnosis not present

## 2019-07-11 DIAGNOSIS — N186 End stage renal disease: Secondary | ICD-10-CM

## 2019-07-11 DIAGNOSIS — I770 Arteriovenous fistula, acquired: Secondary | ICD-10-CM | POA: Diagnosis not present

## 2019-07-11 DIAGNOSIS — Z992 Dependence on renal dialysis: Secondary | ICD-10-CM

## 2019-07-11 NOTE — Progress Notes (Signed)
Virtual Visit via Telephone Note   I connected with Jenna Brown on 07/11/2019 using the [employer] by telephone and verified that I was speaking with the correct person using two identifiers. Patient was located at her home and accompanied by her sister Ruby. I am located at the [facility] office/clinic.   The limitations of evaluation and management by telemedicine and the availability of in person appointments have been previously discussed with the patient and are documented in the patients chart. The patient expressed understanding and consented to proceed.  PCP: [name], MD  Chief Complaint: follow up PAD  History of Present Illness: Jenna Brown is a 68 y.o. female whois s/p right upper arm(brachio-cephalic)AV fistula creationon 03-04-19 by Dr. Donnetta Hutching.  She is also s/p Right fem-PTA bypass graft 03/29/12 with revision 04/05/12 by Dr. [name]. Right AKA 05/10/12.   She dialyzesMondays Wednesdays Fridaysvia [facility] [address], on [address].Pt sees Dr. Joesph July, nephrologist in [facility].  She denies any steal type symptoms in her right upper extremity.  She denies fever or chills  When I saw her on 05-31-19: She alsounderwent multiple attempts at revascularizationby Dr. Ileene Patrick her right leg for nonhealing wound. On 05/10/2012, she ultimately required a right above-knee amputation.  She denies any symptoms on her left leg, denies non healing wounds. She is performing daily seated leg exercises. Diameters were large enough, distal depths were shallow enough, proximal depths were deeper than 0.6 cm. I spoke with Dr. Donzetta Matters, dialysis center may access right upper arm AVF.  Distal cannulation may be more successful than proximal.  If there is difficulty cannulating, may need to schedule superficialization.  It had been 3 months since the AVF creation.  Left ABI in 1 month.  She returns today for left ABI.    She had a stroke in the in the 1990's as manifested by left facial droop, difficulty speaking, no hemiparesis, no monocular loss of vision.   Her current prosthesis is heavy, states she does not use it since it is too heavy.She gets around in her w/c.  Pt hadbeen lost to follow up from 2016 until 07-02-18; states she felt she did not need to return, no testing was done since 2016until 07-10-18. Pt lives with her sister and mother.   She was hospitalized in August 2019, had an MI.  She is a retired Pharmacist, hospital.  Diabetic: Yes, lastA1C result on file was 6.8 on 01-11-18, improved form 8.2 Tobacco [id number] smoker, quitin 2013, started in her 20's  Pt meds include: Statin :Yes Betablocker:Yes ASA:Yes Other anticoagulants/antiplatelets:Pt statesBrilintawas stopped,states Jennye Moccasin caused her to feel like she was dying, did not eat when she was on [place]   Past Medical History:  Diagnosis Date  . Anemia   . Anxiety    takes Xanax prn  . Arthritis   . CKD (chronic kidney disease), stage IV ([place])   . Coronary artery disease 2016   cath w/ 90% LAD, 95%CFX, 80% OM 2, 60% RCA, not CABG candidate, rx medically  . GERD (gastroesophageal reflux disease)   . H/O hiatal hernia   . Headache(784.0)    when b/p is elevated  . Hemorrhoids   . Hx of AKA (above knee amputation), right ([place])   . Hyperlipidemia    takes Simvastatin daily  . Hypertension    takes Amlodipine/HCTZ/Losartan daily  . Migraines   . Myocardial infarction ([place]) 1990's  . NSTEMI (non-ST elevated myocardial infarction) ([place])   .  Peripheral vascular disease ([place])   . PONV (postoperative nausea and vomiting)   . Stroke [facility])    TIA history  . Type II diabetes mellitus (HCC)    on Lantus  . Vertigo    takes Ativert prn    Past Surgical History:  Procedure Laterality Date  . ABDOMINAL AORTAGRAM N/A 02/29/2012   Procedure: ABDOMINAL Maxcine Ham;  Surgeon: [name], MD;  Location: [facility] CATH LAB;   Service: Cardiovascular;  Laterality: N/A;  . AMPUTATION  05/10/2012   Procedure: AMPUTATION ABOVE KNEE;  Surgeon: [name], MD;  Location: [place] OR;  Service: Vascular;  Laterality: Right;   open right groin wound noted  . aortogram  02/2012  . AV FISTULA PLACEMENT Right 03/04/2019   Procedure: ARTERIOVENOUS (AV) FISTULA CREATION;  Surgeon: Rosetta Posner, MD;  Location: [place];  Service: Vascular;  Laterality: Right;  . CARDIAC CATHETERIZATION N/A 04/13/2015   Procedure: Right/Left Heart Cath and Coronary Angiography;  Surgeon: [name], MD;  Location: [place] CV LAB;  Service: Cardiovascular;  Laterality: N/A;  . CLEFT PALATE REPAIR     several  . COLONOSCOPY    . DILATION AND CURETTAGE OF UTERUS    . FEMORAL-POPLITEAL BYPASS GRAFT  04/05/2012   Procedure: BYPASS GRAFT FEMORAL-POPLITEAL ARTERY;  Surgeon: [name], MD;  Location: [place];  Service: Vascular;  Laterality: Right;  REVISION  . FEMORAL-TIBIAL BYPASS GRAFT  03/29/2012   Procedure: BYPASS GRAFT FEMORAL-TIBIAL ARTERY;  Surgeon: [name], MD;  Location: [place] OR;  Service: Vascular;  Laterality: Right;  Right femoral to Posterior Tibialis with composite graft of 52mm x 80 cm ringed gortex graft and saphenous vein  ,intraoperative arteriogram  . FEMOROPOPLITEAL THROMBECTOMY / EMBOLECTOMY  05/09/2012  . IR FLUORO GUIDE CV LINE RIGHT  03/01/2019  . IR US GUIDE VASC ACCESS RIGHT  03/01/2019  . MYOMECTOMY    . PR VEIN BYPASS GRAFT,AORTO-FEM-POP  05/27/11   Right SFA-Below knee Pop BP  . TEE WITHOUT CARDIOVERSION N/A 01/03/2017   Procedure: TRANSESOPHAGEAL ECHOCARDIOGRAM (TEE);  Surgeon: Skeet Latch, MD;  Location: [place];  Service: Cardiovascular;  Laterality: N/A;  . THORACENTESIS  02/27/2019      . TONSILLECTOMY    . TUBAL LIGATION  ~ 1990    Current Outpatient Medications on File Prior to Visit  Medication Sig Dispense Refill  . acetaminophen (TYLENOL) 500 MG tablet Take 500 mg by mouth every 6 (six)  hours as needed for mild pain.    Marland Kitchen ALPRAZolam (XANAX) 0.25 MG tablet Take 1 tablet (0.25 mg total) by mouth 3 (three) times daily as needed for anxiety. (Patient taking differently: Take 0.25 mg by mouth daily as needed for anxiety. ) 12 tablet 0  . aspirin 81 MG chewable tablet Chew 81 mg by mouth daily.    Marland Kitchen atorvastatin (LIPITOR) 80 MG tablet Take 80 mg by mouth daily.     . bisacodyl (DULCOLAX) 5 MG EC tablet Take 2 tablets (10 mg total) by mouth daily as needed for moderate constipation. 30 tablet 0  . carvedilol (COREG) 12.5 MG tablet Take 18.75 mg by mouth 2 (two) times daily with a meal. One and a half tablets twice daily.    . Cholecalciferol (VITAMIN D-3 PO) Take 4,000 Units by mouth daily.     . ferrous sulfate 325 (65 FE) MG tablet Take 957 mg by mouth daily with breakfast.     . insulin glargine (LANTUS) 100 UNIT/ML injection Inject 0.2 mLs (  20 Units total) into the skin at bedtime. 10 mL 11  . multivitamin (RENA-VIT) TABS tablet Take 1 tablet by mouth at bedtime. 30 tablet 2  . nitroGLYCERIN (NITROSTAT) 0.4 MG SL tablet Place 1 tablet (0.4 mg total) under the tongue every 5 (five) minutes x 3 doses as needed for chest pain. 25 tablet prn  . polyethylene glycol (MIRALAX / GLYCOLAX) 17 g packet Take 17 g by mouth daily as needed for mild constipation. 14 each 0  . sodium bicarbonate 650 MG tablet Take 1,300 mg by mouth 2 (two) times daily.    . sodium polystyrene (KAYEXALATE) 15 GM/60ML suspension Take 7.5 mg by mouth once.     No current facility-administered medications on file prior to visit.    12 system ROS was negative unless otherwise noted in HPI   Observations/Objective:  DATA  ABI Findings: +--------+------------------+-----+--------+----------+ Right   Rt Pressure (mmHg)IndexWaveformComment    +--------+------------------+-----+--------+----------+ Brachial                               AVF        +--------+------------------+-----+--------+----------+  PTA                                    amputation +--------+------------------+-----+--------+----------+  +---------+------------------+-----+----------+-------+ Left     Lt Pressure (mmHg)IndexWaveform  Comment +---------+------------------+-----+----------+-------+ Brachial 177                                      +---------+------------------+-----+----------+-------+ PTA      141               0.80 monophasic        +---------+------------------+-----+----------+-------+ DP       164               0.93 monophasic        +---------+------------------+-----+----------+-------+ Great Toe82                0.46                   +---------+------------------+-----+----------+-------+  +-------+-----------+-----------+------------+------------+ ABI/TBIToday's ABIToday's TBIPrevious ABIPrevious TBI +-------+-----------+-----------+------------+------------+ Right  amp                   amp                      +-------+-----------+-----------+------------+------------+ Left   0.93       0.46       0.7         0.37         +-------+-----------+-----------+------------+------------+ Summary: Right: AKA. Left: Resting left ankle-brachial index indicates mild left lower extremity arterial disease. The left toe-brachial index is abnormal.   Assessment and Plan: Ptis s/p right upper arm(brachio-cephalic)AV fistula creationon 03-04-19 by Dr. Donnetta Hutching.   She is also s/p right AKA for PAD.   Pt denies any pain in her finger or hand, denies any steal type symptoms.  She states that the ischemic blister on her right 3rd fingertip has healed.   At her last visit Dr. Oneida Alar spoke with pt and sister. At that time advised pt HD center to NOT USE AVF until she returns to see [place] in 2-3 weeks for check of her right 3rd fingertip. Only use right  IJ TDC for dialysis until further notice. Want to see if her finger heals (which it did), if so,  AVF may need superficialization before using again. If signs of ischemia are apparent in her right hand/fingers in 2-3 weeks, may need to ligate the AVF and search for another permanent access.   Follow Up Instructions:   Follow up in the next few weeks with a [facility] surgeon to discuss what if any thing needs to be done to make her AVF more usable, she currently is dialyzed via her TDC. Will request dialysis duplex.  Pt sister phone number is [phone], her name is Ruby.   Repeat left ABI in 6-9 months.    I discussed the assessment and treatment plan with the patient. The patient was provided an opportunity to ask questions and all were answered. The patient agreed with the plan and demonstrated an understanding of the instructions.   The patient was advised to call back or seek an in-person evaluation if the symptoms worsen or if the condition fails to improve as anticipated.  I spent 12 minutes with the patient via telephone encounter.   Gabrielle Dare Jerrie Schussler [facility] [place] Office: [phone]  07/11/2019, 6:40 PM

## 2019-07-12 DIAGNOSIS — D509 Iron deficiency anemia, unspecified: Secondary | ICD-10-CM | POA: Diagnosis not present

## 2019-07-12 DIAGNOSIS — D631 Anemia in chronic kidney disease: Secondary | ICD-10-CM | POA: Diagnosis not present

## 2019-07-12 DIAGNOSIS — E1121 Type 2 diabetes mellitus with diabetic nephropathy: Secondary | ICD-10-CM | POA: Diagnosis not present

## 2019-07-12 DIAGNOSIS — N2581 Secondary hyperparathyroidism of renal origin: Secondary | ICD-10-CM | POA: Diagnosis not present

## 2019-07-12 DIAGNOSIS — E785 Hyperlipidemia, unspecified: Secondary | ICD-10-CM | POA: Diagnosis not present

## 2019-07-12 DIAGNOSIS — N186 End stage renal disease: Secondary | ICD-10-CM | POA: Diagnosis not present

## 2019-07-15 ENCOUNTER — Ambulatory Visit: Payer: Medicare Other | Admitting: Adult Health

## 2019-07-15 DIAGNOSIS — N2581 Secondary hyperparathyroidism of renal origin: Secondary | ICD-10-CM | POA: Diagnosis not present

## 2019-07-15 DIAGNOSIS — N186 End stage renal disease: Secondary | ICD-10-CM | POA: Diagnosis not present

## 2019-07-15 DIAGNOSIS — E785 Hyperlipidemia, unspecified: Secondary | ICD-10-CM | POA: Diagnosis not present

## 2019-07-15 DIAGNOSIS — E1121 Type 2 diabetes mellitus with diabetic nephropathy: Secondary | ICD-10-CM | POA: Diagnosis not present

## 2019-07-15 DIAGNOSIS — D509 Iron deficiency anemia, unspecified: Secondary | ICD-10-CM | POA: Diagnosis not present

## 2019-07-15 DIAGNOSIS — D631 Anemia in chronic kidney disease: Secondary | ICD-10-CM | POA: Diagnosis not present

## 2019-07-17 DIAGNOSIS — D631 Anemia in chronic kidney disease: Secondary | ICD-10-CM | POA: Diagnosis not present

## 2019-07-17 DIAGNOSIS — E1121 Type 2 diabetes mellitus with diabetic nephropathy: Secondary | ICD-10-CM | POA: Diagnosis not present

## 2019-07-17 DIAGNOSIS — D509 Iron deficiency anemia, unspecified: Secondary | ICD-10-CM | POA: Diagnosis not present

## 2019-07-17 DIAGNOSIS — N186 End stage renal disease: Secondary | ICD-10-CM | POA: Diagnosis not present

## 2019-07-17 DIAGNOSIS — E785 Hyperlipidemia, unspecified: Secondary | ICD-10-CM | POA: Diagnosis not present

## 2019-07-17 DIAGNOSIS — N2581 Secondary hyperparathyroidism of renal origin: Secondary | ICD-10-CM | POA: Diagnosis not present

## 2019-07-17 NOTE — Progress Notes (Signed)
Virtual Visit via Telephone Note   This visit type was conducted due to national recommendations for restrictions regarding the COVID-19 Pandemic (e.g. social distancing) in an effort to limit this patient's exposure and mitigate transmission in our community.  Due to her co-morbid illnesses, this patient is at least at moderate risk for complications without adequate follow up.  This format is felt to be most appropriate for this patient at this time.  The patient did not have access to video technology/had technical difficulties with video requiring transitioning to audio format only (telephone).  All issues noted in this document were discussed and addressed.  No physical exam could be performed with this format.  Please refer to the patient's chart for her  consent to telehealth for [facility].   Date:  07/17/2019   ID:  Jenna Brown, DOB 12-31-1950, MRN [id number]  Patient Location: Home Provider Location: Home  PCP:  [name], MD  Cardiologist:  Skeet Latch, MD  Electrophysiologist:  None   Evaluation Performed:  Follow-Up Visit  Chief Complaint:  Hospital Follow Up  History of Present Illness:    Jenna Brown is a 68 y.o. female with known history of severe CAD with multiple non-ST elevation MI, not a candidate for CABG, chronic systolic and diastolic heart failure, hypertension, ESRD on hemodialysis, diabetes, status post AKA of the right, who was seen during recent hospitalization on 02/26/2019 for chest pain and acute CHF decompensation.  Most recent echocardiogram revealed EF of 45% with hypokinesis of distal inferior, inferior lateral, inferior septal, and apical walls.  The patient was dialyzed with removal of 3.3 L during admission.  Her carvedilol was discontinued due to hypotension.  She was also found to have moderate bilateral pleural effusions on admission and underwent right thoracentesis on 02/27/2019 with removal of 1.2 L of clear serous fluid.   The patient was continued on aspirin and Lipitor but due to hypotension Coreg Imdur and hydralazine have been discontinued.  The patient had a right upper arm AV fistula placed on 03/04/2019 and is to continue dialysis at [facility] dialysis center.  She was discharged on 03/06/2019. MW`F.   She was last seen on 03/14/2019 and was doing well, compliant with dialysis and other medication regimen.  Since being seen last the patient was seen by Dr. [name] due to a nonhealing wound on the right leg.  She ultimately required a right AKA.  She is also being followed by Dr. [name] for AV cannula patency and treatment.  Jenna Brown is without complaints today.  Her blood pressure is well controlled.  She is medically compliant, denies any chest pain or pain in her amputation site.  She states she is doing well.  The patient does not have symptoms concerning for COVID-19 infection (fever, chills, cough, or new shortness of breath).    Past Medical History:  Diagnosis Date  . Anemia   . Anxiety    takes Xanax prn  . Arthritis   . CKD (chronic kidney disease), stage IV ([place])   . Coronary artery disease 2016   cath w/ 90% LAD, 95%CFX, 80% OM 2, 60% RCA, not CABG candidate, rx medically  . GERD (gastroesophageal reflux disease)   . H/O hiatal hernia   . Headache(784.0)    when b/p is elevated  . Hemorrhoids   . Hx of AKA (above knee amputation), right ([place])   . Hyperlipidemia    takes Simvastatin daily  . Hypertension    takes Amlodipine/HCTZ/Losartan  daily  . Migraines   . Myocardial infarction ([place]) 1990's  . NSTEMI (non-ST elevated myocardial infarction) ([place])   . Peripheral vascular disease ([place])   . PONV (postoperative nausea and vomiting)   . Stroke [facility])    TIA history  . Type II diabetes mellitus (HCC)    on Lantus  . Vertigo    takes Ativert prn   Past Surgical History:  Procedure Laterality Date  . ABDOMINAL AORTAGRAM N/A 02/29/2012   Procedure: ABDOMINAL Maxcine Ham;  Surgeon:  [name], MD;  Location: [facility] CATH LAB;  Service: Cardiovascular;  Laterality: N/A;  . AMPUTATION  05/10/2012   Procedure: AMPUTATION ABOVE KNEE;  Surgeon: [name], MD;  Location: [place] OR;  Service: Vascular;  Laterality: Right;   open right groin wound noted  . aortogram  02/2012  . AV FISTULA PLACEMENT Right 03/04/2019   Procedure: ARTERIOVENOUS (AV) FISTULA CREATION;  Surgeon: Rosetta Posner, MD;  Location: [place];  Service: Vascular;  Laterality: Right;  . CARDIAC CATHETERIZATION N/A 04/13/2015   Procedure: Right/Left Heart Cath and Coronary Angiography;  Surgeon: [name], MD;  Location: [place] CV LAB;  Service: Cardiovascular;  Laterality: N/A;  . CLEFT PALATE REPAIR     several  . COLONOSCOPY    . DILATION AND CURETTAGE OF UTERUS    . FEMORAL-POPLITEAL BYPASS GRAFT  04/05/2012   Procedure: BYPASS GRAFT FEMORAL-POPLITEAL ARTERY;  Surgeon: [name], MD;  Location: [place];  Service: Vascular;  Laterality: Right;  REVISION  . FEMORAL-TIBIAL BYPASS GRAFT  03/29/2012   Procedure: BYPASS GRAFT FEMORAL-TIBIAL ARTERY;  Surgeon: [name], MD;  Location: [place] OR;  Service: Vascular;  Laterality: Right;  Right femoral to Posterior Tibialis with composite graft of 18mm x 80 cm ringed gortex graft and saphenous vein  ,intraoperative arteriogram  . FEMOROPOPLITEAL THROMBECTOMY / EMBOLECTOMY  05/09/2012  . IR FLUORO GUIDE CV LINE RIGHT  03/01/2019  . IR US GUIDE VASC ACCESS RIGHT  03/01/2019  . MYOMECTOMY    . PR VEIN BYPASS GRAFT,AORTO-FEM-POP  05/27/11   Right SFA-Below knee Pop BP  . TEE WITHOUT CARDIOVERSION N/A 01/03/2017   Procedure: TRANSESOPHAGEAL ECHOCARDIOGRAM (TEE);  Surgeon: Skeet Latch, MD;  Location: [place];  Service: Cardiovascular;  Laterality: N/A;  . THORACENTESIS  02/27/2019      . TONSILLECTOMY    . TUBAL LIGATION  ~ 1990     No outpatient medications have been marked as taking for the 07/18/19 encounter (Appointment) with Lendon Colonel,  NP.     Allergies:   Plavix [clopidogrel bisulfate], Codeine, and Tylenol with codeine #3 [acetaminophen-codeine]   Social History   Tobacco Use  . Smoking status: Former Smoker    Packs/day: 0.25    Years: 40.00    Pack years: 10.00    Types: Cigarettes    Quit date: 02/27/2012    Years since quitting: 7.3  . Smokeless tobacco: Never Used  Substance Use Topics  . Alcohol use: No  . Drug use: No     Family Hx: The patient's family history includes Cancer in her brother and mother; Diabetes in her father; Heart attack in her maternal grandfather and maternal grandmother.  ROS:   Please see the history of present illness.    All other systems reviewed and are negative.   Prior CV studies:   The following studies were reviewed today: Echocardiogram 08/01/2018 Left ventricle: The cavity size was normal. There was mild focal basal hypertrophy of the  septum. Systolic function was mildly reduced. The estimated ejection fraction was in the range of 45% to 50%. Hypokinesis of the apicalinferolateral, inferior, inferoseptal, and apical myocardium. - Aortic valve: Trileaflet; mildly thickened, mildly calcified leaflets. Left coronary cusp mobility was moderately restricted. - Mitral valve: Calcified annulus. Mildly thickened leaflets . There was trivial regurgitation. - Right ventricle: The cavity size was mildly dilated. Wall thickness was normal. Systolic function was low normal. - Atrial septum: No defect or patent foramen ovale was identified. - Tricuspid valve: There was no significant regurgitation. - Pulmonic valve: There was trivial regurgitation.  Labs/Other Tests and Data Reviewed:    EKG:  No ECG reviewed.  Recent Labs: 02/26/2019: ALT 20; B Natriuretic Peptide 1,190.5 03/06/2019: BUN 42; Creatinine, Ser 4.87; Hemoglobin 8.3; Platelets 133; Potassium 4.6; Sodium 135   Recent Lipid Panel Lab Results  Component Value Date/Time   CHOL 162 09/15/2018  05:22 AM   TRIG 51 09/15/2018 05:22 AM   HDL 45 09/15/2018 05:22 AM   CHOLHDL 3.6 09/15/2018 05:22 AM   LDLCALC 107 (H) 09/15/2018 05:22 AM    Wt Readings from Last 3 Encounters:  06/20/19 152 lb (68.9 kg)  05/31/19 152 lb (68.9 kg)  05/31/19 152 lb (68.9 kg)     Objective:    Vital Signs:  There were no vitals taken for this visit.   VITAL SIGNS:  reviewed GEN:  no acute distress NEURO:  alert and oriented x 3, no obvious focal deficit PSYCH:  normal affect  ASSESSMENT & PLAN:    1.Hypertension; blood pressures currently well controlled at home.  She is medically compliant and denies any complaints of chest pain, blurred vision, dizziness, or dyspnea.  We will continue carvedilol 18.75 mg twice daily and provide refills.  2.  CAD: Not a candidate for CABG, multiple non-ST elevation MI.  Treated medically.  3.  PAD: Status post right AKA.  Being followed by Dr. [name]  4.  Hyperlipidemia: Continues on atorvastatin 80 mg daily with LDL goal of less than 70.  5.  Insulin-dependent diabetes: Followed by primary care, follow-up labs are completed through their office.  6.  End-stage renal disease: On hemodialysis Monday Wednesdays and Fridays followed by nephrology.  Volume status is managed through dialysis.  5.  Chronic mixed CHF: She has no complaints of fluid retention, PND, or dyspnea.  Volume status managed through dialysis and nephrology.  COVID-19 Education: The signs and symptoms of COVID-19 were discussed with the patient and how to seek care for testing (follow up with PCP or arrange E-visit).  The importance of social distancing was discussed today.  Time:   Today, I have spent 15 minutes with the patient with telehealth technology discussing the above problems.     Medication Adjustments/Labs and Tests Ordered: Current medicines are reviewed at length with the patient today.  Concerns regarding medicines are outlined above.   Tests Ordered: No orders of the  defined types were placed in this encounter.   Medication Changes: No orders of the defined types were placed in this encounter.   Disposition:  Follow up in 6 month(s)  Signed, Phill Myron. [place] Pugh, ANP, AACC  07/17/2019 12:55 PM    [facility]

## 2019-07-18 ENCOUNTER — Encounter: Payer: Self-pay | Admitting: Adult Health

## 2019-07-18 ENCOUNTER — Telehealth (INDEPENDENT_AMBULATORY_CARE_PROVIDER_SITE_OTHER): Payer: Medicare Other | Admitting: Adult Health

## 2019-07-18 VITALS — Ht 64.0 in | Wt 152.0 lb

## 2019-07-18 DIAGNOSIS — I2510 Atherosclerotic heart disease of native coronary artery without angina pectoris: Secondary | ICD-10-CM

## 2019-07-18 DIAGNOSIS — I10 Essential (primary) hypertension: Secondary | ICD-10-CM

## 2019-07-18 DIAGNOSIS — I739 Peripheral vascular disease, unspecified: Secondary | ICD-10-CM

## 2019-07-18 DIAGNOSIS — I5042 Chronic combined systolic (congestive) and diastolic (congestive) heart failure: Secondary | ICD-10-CM

## 2019-07-18 DIAGNOSIS — G459 Transient cerebral ischemic attack, unspecified: Secondary | ICD-10-CM

## 2019-07-18 DIAGNOSIS — N185 Chronic kidney disease, stage 5: Secondary | ICD-10-CM

## 2019-07-18 MED ORDER — CARVEDILOL 12.5 MG PO TABS: 18.7500 mg | ORAL_TABLET | Freq: Two times a day (BID) | ORAL | 3 refills | Status: DC

## 2019-07-18 NOTE — Patient Instructions (Signed)
Medication Instructions:  Continue current medications  *If you need a refill on your cardiac medications before your next appointment, please call your pharmacy*  Lab Work: None Ordered  Testing/Procedures: None Ordered  Follow-Up: At [facility], you and your health needs are our priority.  As part of our continuing mission to provide you with exceptional heart care, we have created designated Provider Care Teams.  These Care Teams include your primary Cardiologist (physician) and Advanced Practice Providers (APPs -  Physician Assistants and Nurse Practitioners) who all work together to provide you with the care you need, when you need it.  Your next appointment:   6 month(s)  The format for your next appointment:   In Person  Provider:   Tiffany Caroline, MD    

## 2019-07-19 DIAGNOSIS — D631 Anemia in chronic kidney disease: Secondary | ICD-10-CM | POA: Diagnosis not present

## 2019-07-19 DIAGNOSIS — N186 End stage renal disease: Secondary | ICD-10-CM | POA: Diagnosis not present

## 2019-07-19 DIAGNOSIS — E1121 Type 2 diabetes mellitus with diabetic nephropathy: Secondary | ICD-10-CM | POA: Diagnosis not present

## 2019-07-19 DIAGNOSIS — N2581 Secondary hyperparathyroidism of renal origin: Secondary | ICD-10-CM | POA: Diagnosis not present

## 2019-07-19 DIAGNOSIS — D509 Iron deficiency anemia, unspecified: Secondary | ICD-10-CM | POA: Diagnosis not present

## 2019-07-19 DIAGNOSIS — E785 Hyperlipidemia, unspecified: Secondary | ICD-10-CM | POA: Diagnosis not present

## 2019-07-20 DIAGNOSIS — Z992 Dependence on renal dialysis: Secondary | ICD-10-CM | POA: Diagnosis not present

## 2019-07-20 DIAGNOSIS — N186 End stage renal disease: Secondary | ICD-10-CM | POA: Diagnosis not present

## 2019-07-22 DIAGNOSIS — N2581 Secondary hyperparathyroidism of renal origin: Secondary | ICD-10-CM | POA: Diagnosis not present

## 2019-07-22 DIAGNOSIS — N186 End stage renal disease: Secondary | ICD-10-CM | POA: Diagnosis not present

## 2019-07-22 DIAGNOSIS — D631 Anemia in chronic kidney disease: Secondary | ICD-10-CM | POA: Diagnosis not present

## 2019-07-22 DIAGNOSIS — E1121 Type 2 diabetes mellitus with diabetic nephropathy: Secondary | ICD-10-CM | POA: Diagnosis not present

## 2019-07-22 DIAGNOSIS — D509 Iron deficiency anemia, unspecified: Secondary | ICD-10-CM | POA: Diagnosis not present

## 2019-07-23 ENCOUNTER — Other Ambulatory Visit: Payer: Self-pay

## 2019-07-23 DIAGNOSIS — Z992 Dependence on renal dialysis: Secondary | ICD-10-CM

## 2019-07-23 DIAGNOSIS — N186 End stage renal disease: Secondary | ICD-10-CM

## 2019-07-24 DIAGNOSIS — N186 End stage renal disease: Secondary | ICD-10-CM | POA: Diagnosis not present

## 2019-07-24 DIAGNOSIS — D631 Anemia in chronic kidney disease: Secondary | ICD-10-CM | POA: Diagnosis not present

## 2019-07-24 DIAGNOSIS — E1121 Type 2 diabetes mellitus with diabetic nephropathy: Secondary | ICD-10-CM | POA: Diagnosis not present

## 2019-07-24 DIAGNOSIS — D509 Iron deficiency anemia, unspecified: Secondary | ICD-10-CM | POA: Diagnosis not present

## 2019-07-24 DIAGNOSIS — N2581 Secondary hyperparathyroidism of renal origin: Secondary | ICD-10-CM | POA: Diagnosis not present

## 2019-07-26 DIAGNOSIS — E1121 Type 2 diabetes mellitus with diabetic nephropathy: Secondary | ICD-10-CM | POA: Diagnosis not present

## 2019-07-26 DIAGNOSIS — D509 Iron deficiency anemia, unspecified: Secondary | ICD-10-CM | POA: Diagnosis not present

## 2019-07-26 DIAGNOSIS — D631 Anemia in chronic kidney disease: Secondary | ICD-10-CM | POA: Diagnosis not present

## 2019-07-26 DIAGNOSIS — N2581 Secondary hyperparathyroidism of renal origin: Secondary | ICD-10-CM | POA: Diagnosis not present

## 2019-07-26 DIAGNOSIS — N186 End stage renal disease: Secondary | ICD-10-CM | POA: Diagnosis not present

## 2019-07-29 DIAGNOSIS — N186 End stage renal disease: Secondary | ICD-10-CM | POA: Diagnosis not present

## 2019-07-29 DIAGNOSIS — D631 Anemia in chronic kidney disease: Secondary | ICD-10-CM | POA: Diagnosis not present

## 2019-07-29 DIAGNOSIS — E1121 Type 2 diabetes mellitus with diabetic nephropathy: Secondary | ICD-10-CM | POA: Diagnosis not present

## 2019-07-29 DIAGNOSIS — D509 Iron deficiency anemia, unspecified: Secondary | ICD-10-CM | POA: Diagnosis not present

## 2019-07-29 DIAGNOSIS — N2581 Secondary hyperparathyroidism of renal origin: Secondary | ICD-10-CM | POA: Diagnosis not present

## 2019-07-30 ENCOUNTER — Ambulatory Visit (INDEPENDENT_AMBULATORY_CARE_PROVIDER_SITE_OTHER): Payer: Medicare Other | Admitting: Vascular Surgery

## 2019-07-30 ENCOUNTER — Encounter: Payer: Self-pay | Admitting: Vascular Surgery

## 2019-07-30 ENCOUNTER — Other Ambulatory Visit: Payer: Self-pay | Admitting: *Deleted

## 2019-07-30 ENCOUNTER — Encounter: Payer: Self-pay | Admitting: *Deleted

## 2019-07-30 ENCOUNTER — Ambulatory Visit (HOSPITAL_COMMUNITY)
Admission: RE | Admit: 2019-07-30 | Discharge: 2019-07-30 | Disposition: A | Discharge status: Home / Self Care | Payer: Medicare Other | Source: Ambulatory Visit | Attending: Vascular Surgery | Admitting: Vascular Surgery

## 2019-07-30 ENCOUNTER — Other Ambulatory Visit: Payer: Self-pay

## 2019-07-30 VITALS — BP 142/73 | HR 65 | Temp 97.8°F | Resp 20 | Ht 64.0 in | Wt 152.0 lb

## 2019-07-30 DIAGNOSIS — N186 End stage renal disease: Secondary | ICD-10-CM

## 2019-07-30 DIAGNOSIS — Z992 Dependence on renal dialysis: Secondary | ICD-10-CM | POA: Insufficient documentation

## 2019-07-30 DIAGNOSIS — I779 Disorder of arteries and arterioles, unspecified: Secondary | ICD-10-CM | POA: Diagnosis not present

## 2019-07-30 NOTE — Progress Notes (Signed)
Vascular and Vein Specialist of [place]  Patient name: Jenna Brown MRN: [id number] DOB: Aug 19, 1951 Sex: female  REASON FOR VISIT: Evaluation of poorly functioning right upper arm AV fistula  HPI: Jenna Brown is a 68 y.o. female here today for follow-up.  She had right upper arm AV fistula creation by myself in June 2020.  She continues to have hemodialysis via her right IJ catheter.  She reports pain associated with her fistula access attempts and also has had very difficult time due to the depth of the fistula.  She denies any difficulty with use of her catheter and no infection.  She has multiple medical problems as seen below  Past Medical History:  Diagnosis Date  . Anemia   . Anxiety    takes Xanax prn  . Arthritis   . CKD (chronic kidney disease), stage IV ([place])   . Coronary artery disease 2016   cath w/ 90% LAD, 95%CFX, 80% OM 2, 60% RCA, not CABG candidate, rx medically  . GERD (gastroesophageal reflux disease)   . H/O hiatal hernia   . Headache(784.0)    when b/p is elevated  . Hemorrhoids   . Hx of AKA (above knee amputation), right ([place])   . Hyperlipidemia    takes Simvastatin daily  . Hypertension    takes Amlodipine/HCTZ/Losartan daily  . Migraines   . Myocardial infarction ([place]) 1990's  . NSTEMI (non-ST elevated myocardial infarction) ([place])   . Peripheral vascular disease ([place])   . PONV (postoperative nausea and vomiting)   . Stroke [facility])    TIA history  . Type II diabetes mellitus (HCC)    on Lantus  . Vertigo    takes Ativert prn    Family History  Problem Relation Age of Onset  . Cancer Mother        breast  . Diabetes Father   . Cancer Brother   . Heart attack Maternal Grandmother   . Heart attack Maternal Grandfather     SOCIAL HISTORY: Social History   Tobacco Use  . Smoking status: Former Smoker    Packs/day: 0.25    Years: 40.00    Pack years: 10.00    Types: Cigarettes    Quit date:  02/27/2012    Years since quitting: 7.4  . Smokeless tobacco: Never Used  Substance Use Topics  . Alcohol use: No    Allergies  Allergen Reactions  . Plavix [Clopidogrel Bisulfate] Itching  . Codeine Other (See Comments)    "makes me feel strange"  . Tylenol With Codeine #3 [Acetaminophen-Codeine] Other (See Comments)    "doesn't make me feel right"    Current Outpatient Medications  Medication Sig Dispense Refill  . acetaminophen (TYLENOL) 500 MG tablet Take 500 mg by mouth every 6 (six) hours as needed for mild pain.    Marland Kitchen ALPRAZolam (XANAX) 0.25 MG tablet Take 1 tablet (0.25 mg total) by mouth 3 (three) times daily as needed for anxiety. (Patient taking differently: Take 0.25 mg by mouth daily as needed for anxiety. ) 12 tablet 0  . aspirin 81 MG chewable tablet Chew 81 mg by mouth daily.    Marland Kitchen atorvastatin (LIPITOR) 80 MG tablet Take 80 mg by mouth daily.     . bisacodyl (DULCOLAX) 5 MG EC tablet Take 2 tablets (10 mg total) by mouth daily as needed for moderate constipation. 30 tablet 0  . carvedilol (COREG) 12.5 MG tablet Take 1.5 tablets (18.75 mg total) by mouth 2 (two)  times daily with a meal. One and a half tablets twice daily. 270 tablet 3  . Cholecalciferol (VITAMIN D-3 PO) Take 4,000 Units by mouth daily.     . ferrous sulfate 325 (65 FE) MG tablet Take 957 mg by mouth daily with breakfast.     . insulin glargine (LANTUS) 100 UNIT/ML injection Inject 0.2 mLs (20 Units total) into the skin at bedtime. 10 mL 11  . multivitamin (RENA-VIT) TABS tablet Take 1 tablet by mouth at bedtime. 30 tablet 2  . nitroGLYCERIN (NITROSTAT) 0.4 MG SL tablet Place 1 tablet (0.4 mg total) under the tongue every 5 (five) minutes x 3 doses as needed for chest pain. 25 tablet prn  . polyethylene glycol (MIRALAX / GLYCOLAX) 17 g packet Take 17 g by mouth daily as needed for mild constipation. 14 each 0  . RENVELA 800 MG tablet     . sodium bicarbonate 650 MG tablet Take 1,300 mg by mouth 2 (two)  times daily.    . sodium polystyrene (KAYEXALATE) 15 GM/60ML suspension Take 7.5 mg by mouth once.    . torsemide (DEMADEX) 20 MG tablet Take 20 mg by mouth daily.     No current facility-administered medications for this visit.     REVIEW OF SYSTEMS:  [X]  denotes positive finding, [ ]  denotes negative finding Cardiac  Comments:  Chest pain or chest pressure:    Shortness of breath upon exertion:    Short of breath when lying flat:    Irregular heart rhythm:        Vascular    Pain in calf, thigh, or hip brought on by ambulation:    Pain in feet at night that wakes you up from your sleep:     Blood clot in your veins:    Leg swelling:           PHYSICAL EXAM: Vitals:   07/30/19 1453  BP: (!) 142/73  Pulse: 65  Resp: 20  Temp: 97.8 F (36.6 C)  SpO2: 98%  Weight: 152 lb (68.9 kg)  Height: 5\' 4"  (1.626 m)    GENERAL: The patient is a well-nourished female, in no acute distress. The vital signs are documented above. CARDIOVASCULAR: She has a 2+ right radial pulse.  No evidence of ischemia in her hand.  She does have a good thrill in her upper arm fistula.  The vein is superficial at the antecubital space only and runs extremely deep following this. PULMONARY: There is good air exchange  MUSCULOSKELETAL: There are no major deformities or cyanosis. NEUROLOGIC: No focal weakness or paresthesias are detected. SKIN: There are no ulcers or rashes noted. PSYCHIATRIC: The patient has a normal affect.  DATA:  Formal duplex showed patency of the fistula with no evidence of stenosis  I imaged her fistula with SonoSite ultrasound.  She does have excellent size maturation but the vein does run deep to the fat throughout her entire upper arm  MEDICAL ISSUES: I discussed options with the patient and her family present.  I have recommended a superficial mobilization of her fistula.  She is concerned that she may continue to have pain associated with access of her fistula following this  procedure.  I explained that most patients do not note severe pain with access and hopefully with superficial mobilization and easier access attempts this pain will be diminished.  She is willing to proceed and we will schedule this on a nondialysis day    Rosetta Posner, MD [facility] Vascular  and Vein Specialists of [facility] Tel [phone] Pager [phone]

## 2019-07-30 NOTE — Progress Notes (Signed)
Discussed with patient and her sister they agree to speak with [facility] to rearrange HD days to accommodate surgery on a Wednesday 08/21/2019 with Dr. Donnetta Hutching.

## 2019-07-30 NOTE — H&P (View-Only) (Signed)
Vascular and Vein Specialist of [place]  Patient name: Jenna Brown MRN: [id number] DOB: [birth year] Sex: female  REASON FOR VISIT: Evaluation of poorly functioning right upper arm AV fistula  HPI: Jenna Brown is a 68 y.o. female here today for follow-up.  She had right upper arm AV fistula creation by myself in June 2020.  She continues to have hemodialysis via her right IJ catheter.  She reports pain associated with her fistula access attempts and also has had very difficult time due to the depth of the fistula.  She denies any difficulty with use of her catheter and no infection.  She has multiple medical problems as seen below  Past Medical History:  Diagnosis Date  . Anemia   . Anxiety    takes Xanax prn  . Arthritis   . CKD (chronic kidney disease), stage IV ([place])   . Coronary artery disease 2016   cath w/ 90% LAD, 95%CFX, 80% OM 2, 60% RCA, not CABG candidate, rx medically  . GERD (gastroesophageal reflux disease)   . H/O hiatal hernia   . Headache(784.0)    when b/p is elevated  . Hemorrhoids   . Hx of AKA (above knee amputation), right ([place])   . Hyperlipidemia    takes Simvastatin daily  . Hypertension    takes Amlodipine/HCTZ/Losartan daily  . Migraines   . Myocardial infarction ([place]) 1990's  . NSTEMI (non-ST elevated myocardial infarction) ([place])   . Peripheral vascular disease ([place])   . PONV (postoperative nausea and vomiting)   . Stroke [facility])    TIA history  . Type II diabetes mellitus (HCC)    on Lantus  . Vertigo    takes Ativert prn    Family History  Problem Relation Age of Onset  . Cancer Mother        breast  . Diabetes Father   . Cancer Brother   . Heart attack Maternal Grandmother   . Heart attack Maternal Grandfather     SOCIAL HISTORY: Social History   Tobacco Use  . Smoking status: Former Smoker    Packs/day: 0.25    Years: 40.00    Pack years: 10.00    Types: Cigarettes    Quit date:  02/27/2012    Years since quitting: 7.4  . Smokeless tobacco: Never Used  Substance Use Topics  . Alcohol use: No    Allergies  Allergen Reactions  . Plavix [Clopidogrel Bisulfate] Itching  . Codeine Other (See Comments)    "makes me feel strange"  . Tylenol With Codeine #3 [Acetaminophen-Codeine] Other (See Comments)    "doesn't make me feel right"    Current Outpatient Medications  Medication Sig Dispense Refill  . acetaminophen (TYLENOL) 500 MG tablet Take 500 mg by mouth every 6 (six) hours as needed for mild pain.    Marland Kitchen ALPRAZolam (XANAX) 0.25 MG tablet Take 1 tablet (0.25 mg total) by mouth 3 (three) times daily as needed for anxiety. (Patient taking differently: Take 0.25 mg by mouth daily as needed for anxiety. ) 12 tablet 0  . aspirin 81 MG chewable tablet Chew 81 mg by mouth daily.    Marland Kitchen atorvastatin (LIPITOR) 80 MG tablet Take 80 mg by mouth daily.     . bisacodyl (DULCOLAX) 5 MG EC tablet Take 2 tablets (10 mg total) by mouth daily as needed for moderate constipation. 30 tablet 0  . carvedilol (COREG) 12.5 MG tablet Take 1.5 tablets (18.75 mg total) by mouth 2 (two)  times daily with a meal. One and a half tablets twice daily. 270 tablet 3  . Cholecalciferol (VITAMIN D-3 PO) Take 4,000 Units by mouth daily.     . ferrous sulfate 325 (65 FE) MG tablet Take 957 mg by mouth daily with breakfast.     . insulin glargine (LANTUS) 100 UNIT/ML injection Inject 0.2 mLs (20 Units total) into the skin at bedtime. 10 mL 11  . multivitamin (RENA-VIT) TABS tablet Take 1 tablet by mouth at bedtime. 30 tablet 2  . nitroGLYCERIN (NITROSTAT) 0.4 MG SL tablet Place 1 tablet (0.4 mg total) under the tongue every 5 (five) minutes x 3 doses as needed for chest pain. 25 tablet prn  . polyethylene glycol (MIRALAX / GLYCOLAX) 17 g packet Take 17 g by mouth daily as needed for mild constipation. 14 each 0  . RENVELA 800 MG tablet     . sodium bicarbonate 650 MG tablet Take 1,300 mg by mouth 2 (two)  times daily.    . sodium polystyrene (KAYEXALATE) 15 GM/60ML suspension Take 7.5 mg by mouth once.    . torsemide (DEMADEX) 20 MG tablet Take 20 mg by mouth daily.     No current facility-administered medications for this visit.     REVIEW OF SYSTEMS:  [X]  denotes positive finding, [ ]  denotes negative finding Cardiac  Comments:  Chest pain or chest pressure:    Shortness of breath upon exertion:    Short of breath when lying flat:    Irregular heart rhythm:        Vascular    Pain in calf, thigh, or hip brought on by ambulation:    Pain in feet at night that wakes you up from your sleep:     Blood clot in your veins:    Leg swelling:           PHYSICAL EXAM: Vitals:   07/30/19 1453  BP: (!) 142/73  Pulse: 65  Resp: 20  Temp: 97.8 F (36.6 C)  SpO2: 98%  Weight: 152 lb (68.9 kg)  Height: 5\' 4"  (1.626 m)    GENERAL: The patient is a well-nourished female, in no acute distress. The vital signs are documented above. CARDIOVASCULAR: She has a 2+ right radial pulse.  No evidence of ischemia in her hand.  She does have a good thrill in her upper arm fistula.  The vein is superficial at the antecubital space only and runs extremely deep following this. PULMONARY: There is good air exchange  MUSCULOSKELETAL: There are no major deformities or cyanosis. NEUROLOGIC: No focal weakness or paresthesias are detected. SKIN: There are no ulcers or rashes noted. PSYCHIATRIC: The patient has a normal affect.  DATA:  Formal duplex showed patency of the fistula with no evidence of stenosis  I imaged her fistula with SonoSite ultrasound.  She does have excellent size maturation but the vein does run deep to the fat throughout her entire upper arm  MEDICAL ISSUES: I discussed options with the patient and her family present.  I have recommended a superficial mobilization of her fistula.  She is concerned that she may continue to have pain associated with access of her fistula following this  procedure.  I explained that most patients do not note severe pain with access and hopefully with superficial mobilization and easier access attempts this pain will be diminished.  She is willing to proceed and we will schedule this on a nondialysis day    Rosetta Posner, MD [facility] Vascular  and Vein Specialists of [facility] Tel [phone] Pager [phone]

## 2019-07-31 DIAGNOSIS — E1121 Type 2 diabetes mellitus with diabetic nephropathy: Secondary | ICD-10-CM | POA: Diagnosis not present

## 2019-07-31 DIAGNOSIS — D509 Iron deficiency anemia, unspecified: Secondary | ICD-10-CM | POA: Diagnosis not present

## 2019-07-31 DIAGNOSIS — N2581 Secondary hyperparathyroidism of renal origin: Secondary | ICD-10-CM | POA: Diagnosis not present

## 2019-07-31 DIAGNOSIS — D631 Anemia in chronic kidney disease: Secondary | ICD-10-CM | POA: Diagnosis not present

## 2019-07-31 DIAGNOSIS — N186 End stage renal disease: Secondary | ICD-10-CM | POA: Diagnosis not present

## 2019-08-02 DIAGNOSIS — D631 Anemia in chronic kidney disease: Secondary | ICD-10-CM | POA: Diagnosis not present

## 2019-08-02 DIAGNOSIS — E1121 Type 2 diabetes mellitus with diabetic nephropathy: Secondary | ICD-10-CM | POA: Diagnosis not present

## 2019-08-02 DIAGNOSIS — D509 Iron deficiency anemia, unspecified: Secondary | ICD-10-CM | POA: Diagnosis not present

## 2019-08-02 DIAGNOSIS — N186 End stage renal disease: Secondary | ICD-10-CM | POA: Diagnosis not present

## 2019-08-02 DIAGNOSIS — N2581 Secondary hyperparathyroidism of renal origin: Secondary | ICD-10-CM | POA: Diagnosis not present

## 2019-08-05 ENCOUNTER — Telehealth: Payer: Self-pay | Admitting: *Deleted

## 2019-08-05 DIAGNOSIS — N186 End stage renal disease: Secondary | ICD-10-CM | POA: Diagnosis not present

## 2019-08-05 DIAGNOSIS — E1121 Type 2 diabetes mellitus with diabetic nephropathy: Secondary | ICD-10-CM | POA: Diagnosis not present

## 2019-08-05 DIAGNOSIS — D631 Anemia in chronic kidney disease: Secondary | ICD-10-CM | POA: Diagnosis not present

## 2019-08-05 DIAGNOSIS — D509 Iron deficiency anemia, unspecified: Secondary | ICD-10-CM | POA: Diagnosis not present

## 2019-08-05 DIAGNOSIS — N2581 Secondary hyperparathyroidism of renal origin: Secondary | ICD-10-CM | POA: Diagnosis not present

## 2019-08-05 NOTE — Telephone Encounter (Signed)
Call to patient's sister RUBY. Left message on voice mail.  Notified of arrival for surgery time change. Instructed to be at [facility] admitting at 8:30 am oar as directed by the hospital for 08/21/2019 surgery. All other instructions unchanged.

## 2019-08-07 DIAGNOSIS — D509 Iron deficiency anemia, unspecified: Secondary | ICD-10-CM | POA: Diagnosis not present

## 2019-08-07 DIAGNOSIS — D631 Anemia in chronic kidney disease: Secondary | ICD-10-CM | POA: Diagnosis not present

## 2019-08-07 DIAGNOSIS — N2581 Secondary hyperparathyroidism of renal origin: Secondary | ICD-10-CM | POA: Diagnosis not present

## 2019-08-07 DIAGNOSIS — E1121 Type 2 diabetes mellitus with diabetic nephropathy: Secondary | ICD-10-CM | POA: Diagnosis not present

## 2019-08-07 DIAGNOSIS — N186 End stage renal disease: Secondary | ICD-10-CM | POA: Diagnosis not present

## 2019-08-09 DIAGNOSIS — D631 Anemia in chronic kidney disease: Secondary | ICD-10-CM | POA: Diagnosis not present

## 2019-08-09 DIAGNOSIS — D509 Iron deficiency anemia, unspecified: Secondary | ICD-10-CM | POA: Diagnosis not present

## 2019-08-09 DIAGNOSIS — E1121 Type 2 diabetes mellitus with diabetic nephropathy: Secondary | ICD-10-CM | POA: Diagnosis not present

## 2019-08-09 DIAGNOSIS — N2581 Secondary hyperparathyroidism of renal origin: Secondary | ICD-10-CM | POA: Diagnosis not present

## 2019-08-09 DIAGNOSIS — N186 End stage renal disease: Secondary | ICD-10-CM | POA: Diagnosis not present

## 2019-08-11 DIAGNOSIS — E1121 Type 2 diabetes mellitus with diabetic nephropathy: Secondary | ICD-10-CM | POA: Diagnosis not present

## 2019-08-11 DIAGNOSIS — D631 Anemia in chronic kidney disease: Secondary | ICD-10-CM | POA: Diagnosis not present

## 2019-08-11 DIAGNOSIS — N2581 Secondary hyperparathyroidism of renal origin: Secondary | ICD-10-CM | POA: Diagnosis not present

## 2019-08-11 DIAGNOSIS — N186 End stage renal disease: Secondary | ICD-10-CM | POA: Diagnosis not present

## 2019-08-11 DIAGNOSIS — D509 Iron deficiency anemia, unspecified: Secondary | ICD-10-CM | POA: Diagnosis not present

## 2019-08-13 DIAGNOSIS — N186 End stage renal disease: Secondary | ICD-10-CM | POA: Diagnosis not present

## 2019-08-13 DIAGNOSIS — D509 Iron deficiency anemia, unspecified: Secondary | ICD-10-CM | POA: Diagnosis not present

## 2019-08-13 DIAGNOSIS — D631 Anemia in chronic kidney disease: Secondary | ICD-10-CM | POA: Diagnosis not present

## 2019-08-13 DIAGNOSIS — N2581 Secondary hyperparathyroidism of renal origin: Secondary | ICD-10-CM | POA: Diagnosis not present

## 2019-08-13 DIAGNOSIS — E1121 Type 2 diabetes mellitus with diabetic nephropathy: Secondary | ICD-10-CM | POA: Diagnosis not present

## 2019-08-16 DIAGNOSIS — D631 Anemia in chronic kidney disease: Secondary | ICD-10-CM | POA: Diagnosis not present

## 2019-08-16 DIAGNOSIS — N2581 Secondary hyperparathyroidism of renal origin: Secondary | ICD-10-CM | POA: Diagnosis not present

## 2019-08-16 DIAGNOSIS — D509 Iron deficiency anemia, unspecified: Secondary | ICD-10-CM | POA: Diagnosis not present

## 2019-08-16 DIAGNOSIS — N186 End stage renal disease: Secondary | ICD-10-CM | POA: Diagnosis not present

## 2019-08-16 DIAGNOSIS — E1121 Type 2 diabetes mellitus with diabetic nephropathy: Secondary | ICD-10-CM | POA: Diagnosis not present

## 2019-08-17 ENCOUNTER — Other Ambulatory Visit (HOSPITAL_COMMUNITY)
Admission: RE | Admit: 2019-08-17 | Discharge: 2019-08-17 | Disposition: A | Discharge status: Home / Self Care | Payer: Medicare Other | Source: Ambulatory Visit | Attending: Vascular Surgery | Admitting: Vascular Surgery

## 2019-08-17 DIAGNOSIS — Z20828 Contact with and (suspected) exposure to other viral communicable diseases: Secondary | ICD-10-CM | POA: Insufficient documentation

## 2019-08-17 DIAGNOSIS — Z01812 Encounter for preprocedural laboratory examination: Secondary | ICD-10-CM | POA: Insufficient documentation

## 2019-08-18 LAB — NOVEL CORONAVIRUS, NAA (HOSP ORDER, SEND-OUT TO REF LAB; TAT 18-24 HRS): SARS-CoV-2, NAA: NOT DETECTED

## 2019-08-19 DIAGNOSIS — E1121 Type 2 diabetes mellitus with diabetic nephropathy: Secondary | ICD-10-CM | POA: Diagnosis not present

## 2019-08-19 DIAGNOSIS — N186 End stage renal disease: Secondary | ICD-10-CM | POA: Diagnosis not present

## 2019-08-19 DIAGNOSIS — D509 Iron deficiency anemia, unspecified: Secondary | ICD-10-CM | POA: Diagnosis not present

## 2019-08-19 DIAGNOSIS — D631 Anemia in chronic kidney disease: Secondary | ICD-10-CM | POA: Diagnosis not present

## 2019-08-19 DIAGNOSIS — N2581 Secondary hyperparathyroidism of renal origin: Secondary | ICD-10-CM | POA: Diagnosis not present

## 2019-08-19 DIAGNOSIS — Z992 Dependence on renal dialysis: Secondary | ICD-10-CM | POA: Diagnosis not present

## 2019-08-20 ENCOUNTER — Encounter (HOSPITAL_COMMUNITY): Payer: Self-pay | Admitting: *Deleted

## 2019-08-20 ENCOUNTER — Other Ambulatory Visit: Payer: Self-pay

## 2019-08-20 NOTE — Anesthesia Preprocedure Evaluation (Deleted)
Anesthesia Evaluation    Airway        Dental   Pulmonary former smoker,           Cardiovascular hypertension,      Neuro/Psych    GI/Hepatic   Endo/Other  diabetes  Renal/GU      Musculoskeletal   Abdominal   Peds  Hematology   Anesthesia Other Findings   Reproductive/Obstetrics                                                              Anesthesia Evaluation    Reviewed: Allergy & Precautions, Patient's Chart, lab work & pertinent test results  History of Anesthesia Complications (+) PONV and history of anesthetic complications  Airway Mallampati: II  TM Distance: >3 FB Neck ROM: Full    Dental no notable dental hx.    Pulmonary neg pulmonary ROS, former smoker,    Pulmonary exam normal breath sounds clear to auscultation       Cardiovascular hypertension, Pt. on medications + angina + CAD, + Past MI and + Peripheral Vascular Disease  Normal cardiovascular exam Rhythm:Regular Rate:Normal  TTE 2019 - EF using wall tracking approximately 45%. Hypokinesis of distal inferior, inferolateral, inferoseptal, and apical walls. Wall motion visually appears similar to prior. Thickened LCC of aortic valve appears similar to prior.  LHC 2016 Prox LAD to Mid LAD lesion, 90% stenosed. Ost Cx to Mid Cx lesion, 95% stenosed. Ost 2nd Mrg to 2nd Mrg lesion, 80% stenosed. Mid Cx lesion, 90% stenosed. Prox RCA lesion, 60% stenosed. Mid RCA lesion, 50% stenosed.   Neuro/Psych PSYCHIATRIC DISORDERS Anxiety CVA    GI/Hepatic Neg liver ROS, GERD  ,  Endo/Other  negative endocrine ROSdiabetes, Insulin Dependent  Renal/GU ESRF and DialysisRenal disease (K+ 4.0)  negative genitourinary   Musculoskeletal negative musculoskeletal ROS (+)   Abdominal   Peds  Hematology  (+) Blood dyscrasia, anemia ,   Anesthesia Other Findings   Reproductive/Obstetrics                            Anesthesia Physical Anesthesia Plan  ASA: III  Anesthesia Plan: MAC   Post-op Pain Management:    Induction: Intravenous  PONV Risk Score and Plan: 4 or greater and Treatment may vary due to age or medical condition, Midazolam, Ondansetron and Dexamethasone  Airway Management Planned: Oral ETT and LMA  Additional Equipment:   Intra-op Plan:   Post-operative Plan: Extubation in OR  Informed Consent: I have reviewed the patients History and Physical, chart, labs and discussed the procedure including the risks, benefits and alternatives for the proposed anesthesia with the patient or authorized representative who has indicated his/her understanding and acceptance.     Dental advisory given  Plan Discussed with: CRNA  Anesthesia Plan Comments:       Anesthesia Quick Evaluation  Anesthesia Physical Anesthesia Plan  ASA:   Anesthesia Plan:    Post-op Pain Management:    Induction:   PONV Risk Score and Plan:   Airway Management Planned:   Additional Equipment:   Intra-op Plan:   Post-operative Plan:   Informed Consent:   Plan Discussed with:   Anesthesia Plan Comments: (PAT note written 08/20/2019 by Myra Gianotti, PA-C. )  Anesthesia Quick Evaluation  

## 2019-08-20 NOTE — Progress Notes (Signed)
Anesthesia Chart Review: Jenna Brown   Case: [id number] Date/Time: 08/21/19 1112   Procedure: REVISION OF ARTERIOVENOUS FISTULA RIGHT ARM (Right )   Anesthesia type: Choice   Pre-op diagnosis: COMPLICATION OF ARTERIOVENOUS FISTULA RIGHT ARM   Location: [facility] OR ROOM 60 / [place] OR   Surgeon: Jenna Posner, MD      DISCUSSION: Patient is a 68 year old female scheduled for the above procedure. She has ESRD and a RUE AVF (created 03/04/19) that staff have had a difficult time accessing due to depth. Superficial mobilization of her AVF has been recommended. She is currently undergoing HD via right IJ tunneled HD catheter.   History includes former smoker (quit 2013), post-operative N/V, ESRD (HD initiated 02/2019, MWF), CAD (recurrent NSTEMI; seen by CT surgeon Jenna Poot, MD 04/14/15 for CAD/ischemic cardiomyopathy, cardiogenic shock: severe LV dysfunction with poor targets preclude beneficial response to CABG, decision for medical management as small LAD had no target for revascularization and PCI to severe proximal LCX lesion would not likely improve LVF, she refused ICD 07/27/15; EF 45-50% [id number]), chronic systolic and diastolic CHF, DM2, HLD, PVD (right FPBG with GSV graft 05/27/11 with early occlusion, s/p redo with Propaten Gore-Tex graft 05/31/11; Brown to be occluded 02/27/12; s/p right femoral-PTA bypass with PTFE 03/29/12 with early occlusion, s/p thrombectomy and redo bypass 04/05/12, occluded at 04/16/12 visit despite Xarelto; 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  - Admitted 02/26/19-03/06/19 with acute on chronic CHF in setting of progression renal disease. Moderate pleural effusions, s/p right thoracentesis 02/27/19. Tropronin 2.38. No ischemic symptoms. Hemodialysis initiated 03/01/19. Continued medical therapy for CAD recommended by cardiology.  Last cardiology evaluation 07/18/19 with Jenna Sims, NP. Continued medical therapy for CAD. Volume status  managed through dialysis. Six month follow-up recommended.  Note from [facility] indicate that patient to speak with [facility] about surgery date so HD session can be arranged to accommodate a Wednesday surgery.   08/17/19 COVID-19 test negative.  She is a same-day work-up, so she will get labs and anesthesia team evaluation on the day of surgery.   PROVIDERS: Jenna Mccreedy, MD his PCP Jenna Latch, MD is primary cardiologist   LABS: For labs on the day of surgery.   IMAGES: 1V CXR 02/27/19: IMPRESSION: - Enlargement of cardiac silhouette with question minimal pulmonary edema. - Persistent small LEFT pleural effusion with bibasilar opacities, favor atelectasis.   EKG: 02/26/19: Sinus tachycardia at 107 bpm Nonspecific T abnormalities, inferior leads T wave changes similar to Dec 2019 Confirmed by Jenna Brown [phone]) on 02/26/2019 2:38:52 PM   CV: Echo 08/01/18: Study Conclusions - Left ventricle: The cavity size was normal. There was mild focal   basal hypertrophy of the septum. Systolic function was mildly   reduced. The estimated ejection fraction was in the range of 45%   to 50%. Hypokinesis of the apicalinferolateral, inferior,   inferoseptal, and apical myocardium. - Aortic valve: Trileaflet; mildly thickened, mildly calcified   leaflets. Left coronary cusp mobility was moderately restricted. - Mitral valve: Calcified annulus. Mildly thickened leaflets .   There was trivial regurgitation. - Right ventricle: The cavity size was mildly dilated. Wall   thickness was normal. Systolic function was low normal. - Atrial septum: No defect or patent foramen ovale was identified. - Tricuspid valve: There was no significant regurgitation. - Pulmonic valve: There was trivial regurgitation. Impressions: - EF using wall tracking approximately 45%. Hypokinesis of distal   inferior, inferolateral, inferoseptal, and apical  walls. Wall   motion visually appears similar  to prior. Thickened LCC of aortic   valve appears similar to prior.   Cardiac cath 04/13/15 Jenna Brown, Jenna Brown):  Prox LAD to Mid LAD lesion, 90% stenosed.  Ost Cx to Mid Cx lesion, 95% stenosed.  Ost 2nd Mrg to 2nd Mrg lesion, 80% stenosed.  Mid Cx lesion, 90% stenosed.  Prox RCA lesion, 60% stenosed.  Mid RCA lesion, 50% stenosed. IMPRESSION:Ms. Do has three-vessel disease and severe LV dysfunction with elevated filling pressures. Her point Her wedge is elevated as well as is her LVEDP. We will continue to diurese her and optimize her physiology medically. She may benefit from being on intravenous inotropic therapy. We will get the cardiac thoracic surgeons to consult although my initial clinical impression is that she is not a good surgical candidate. She may require percutaneous intervention of her LAD and circumflex coronary arteries. It should be noted that the patient was hypoxic during the procedure with sats in the 90 range on 6 L. Her PA sat was 32 mmHg. She was dyspneic lying on the Table at 30-45. - Not felt to be a candidate for CABG per Jenna Poot, MD. Required short-term milrinone. Ultimately decision to treat medically. Per 07/27/15 note by Jenna Bickers, MD, "I also reviewed cath films and LAD is very small vessel which is not a target for revascularization. There is a severe proximal LCX lesion which might be amenable to PCI. But doubt this would improve LV function and she is currently not having any angina so we will proceed with medical therapy. She refuses ICD.")   Past Medical History:  Diagnosis Date  . Anemia   . Anxiety    takes Xanax prn  . Arthritis   . CKD (chronic kidney disease), stage IV ([place])   . Coronary artery disease 2016   cath w/ 90% LAD, 95%CFX, 80% OM 2, 60% RCA, not CABG candidate, rx medically  . GERD (gastroesophageal reflux disease)   . H/O hiatal hernia   . Headache(784.0)    when b/p is elevated  . Hemorrhoids   . Hx of AKA  (above knee amputation), right ([place])   . Hyperlipidemia    takes Simvastatin daily  . Hypertension    takes Amlodipine/HCTZ/Losartan daily  . Migraines   . Myocardial infarction ([place]) 1990's  . NSTEMI (non-ST elevated myocardial infarction) ([place])   . Peripheral vascular disease ([place])   . PONV (postoperative nausea and vomiting)   . Stroke [facility])    TIA history  . Type II diabetes mellitus (HCC)    on Lantus  . Vertigo    takes Ativert prn    Past Surgical History:  Procedure Laterality Date  . ABDOMINAL AORTAGRAM N/A 02/29/2012   Procedure: ABDOMINAL Maxcine Ham;  Surgeon: [name], MD;  Location: [facility] CATH LAB;  Service: Cardiovascular;  Laterality: N/A;  . AMPUTATION  05/10/2012   Procedure: AMPUTATION ABOVE KNEE;  Surgeon: [name], MD;  Location: [place] OR;  Service: Vascular;  Laterality: Right;   open right groin wound noted  . aortogram  02/2012  . AV FISTULA PLACEMENT Right 03/04/2019   Procedure: ARTERIOVENOUS (AV) FISTULA CREATION;  Surgeon: Jenna Posner, MD;  Location: [place];  Service: Vascular;  Laterality: Right;  . CARDIAC CATHETERIZATION N/A 04/13/2015   Procedure: Right/Left Heart Cath and Coronary Angiography;  Surgeon: [name], MD;  Location: [place] CV LAB;  Service: Cardiovascular;  Laterality: N/A;  . CLEFT PALATE REPAIR  several  . COLONOSCOPY    . DILATION AND CURETTAGE OF UTERUS    . FEMORAL-POPLITEAL BYPASS GRAFT  04/05/2012   Procedure: BYPASS GRAFT FEMORAL-POPLITEAL ARTERY;  Surgeon: [name], MD;  Location: [place];  Service: Vascular;  Laterality: Right;  REVISION  . FEMORAL-TIBIAL BYPASS GRAFT  03/29/2012   Procedure: BYPASS GRAFT FEMORAL-TIBIAL ARTERY;  Surgeon: [name], MD;  Location: [place] OR;  Service: Vascular;  Laterality: Right;  Right femoral to Posterior Tibialis with composite graft of 77mm x 80 cm ringed gortex graft and saphenous vein  ,intraoperative arteriogram  . FEMOROPOPLITEAL THROMBECTOMY / EMBOLECTOMY   05/09/2012  . IR FLUORO GUIDE CV LINE RIGHT  03/01/2019  . IR US GUIDE VASC ACCESS RIGHT  03/01/2019  . MYOMECTOMY    . PR VEIN BYPASS GRAFT,AORTO-FEM-POP  05/27/11   Right SFA-Below knee Pop BP  . TEE WITHOUT CARDIOVERSION N/A 01/03/2017   Procedure: TRANSESOPHAGEAL ECHOCARDIOGRAM (TEE);  Surgeon: Jenna Latch, MD;  Location: [place];  Service: Cardiovascular;  Laterality: N/A;  . THORACENTESIS  02/27/2019      . TONSILLECTOMY    . TUBAL LIGATION  ~ 1990    MEDICATIONS: No current facility-administered medications for this encounter.    Marland Kitchen acetaminophen (TYLENOL) 500 MG tablet  . ALPRAZolam (XANAX) 0.25 MG tablet  . aspirin 81 MG chewable tablet  . atorvastatin (LIPITOR) 80 MG tablet  . carvedilol (COREG) 12.5 MG tablet  . cholecalciferol (VITAMIN D) 25 MCG (1000 UT) tablet  . insulin glargine (LANTUS) 100 UNIT/ML injection  . nitroGLYCERIN (NITROSTAT) 0.4 MG SL tablet  . RENVELA 800 MG tablet  . sodium bicarbonate 650 MG tablet  . bisacodyl (DULCOLAX) 5 MG EC tablet  . multivitamin (RENA-VIT) TABS tablet    Myra Gianotti, PA-C Surgical Short Stay/Anesthesiology [facility] Phone [phone] [facility] Phone [phone] 08/20/2019 12:11 PM

## 2019-08-20 NOTE — Progress Notes (Signed)
Pt denies SOB and chest pain. Pt stated that she is under the care of Dr. [name], PCP and Dr. Skeet Latch, Cardiology. Pt made aware to stop taking, vitamins, fish oil and herbal medications. Do not take any NSAIDs ie: Ibuprofen, Advil, Naproxen (Aleve), Motrin, BC and Goody Powder. Pt made aware to take half = 10 units units of Lantus insulin at HS. Pt made aware to check CBG every 2 hours prior to arrival to hospital on DOS. Pt made aware to treat a CBG < 70 with 4 ounces of apple or cranberry juice, wait 15 minutes after intervention to recheck CBG, if CBG remains < 70, call Short Stay unit to speak with a nurse. Pt verbalized understanding of all pre-op instructions.

## 2019-08-21 ENCOUNTER — Ambulatory Visit (HOSPITAL_COMMUNITY): Payer: Medicare Other | Admitting: Vascular Surgery

## 2019-08-21 ENCOUNTER — Encounter (HOSPITAL_COMMUNITY): Payer: Self-pay

## 2019-08-21 ENCOUNTER — Other Ambulatory Visit: Payer: Self-pay

## 2019-08-21 ENCOUNTER — Ambulatory Visit (HOSPITAL_COMMUNITY)
Admission: RE | Admit: 2019-08-21 | Discharge: 2019-08-21 | Disposition: A | Discharge status: Home / Self Care | Payer: Medicare Other | Attending: Vascular Surgery | Admitting: Vascular Surgery

## 2019-08-21 ENCOUNTER — Encounter (HOSPITAL_COMMUNITY)
Admission: RE | Disposition: A | Discharge status: Home / Self Care | Payer: Self-pay | Source: Home / Self Care | Attending: Vascular Surgery

## 2019-08-21 DIAGNOSIS — Z87891 Personal history of nicotine dependence: Secondary | ICD-10-CM | POA: Insufficient documentation

## 2019-08-21 DIAGNOSIS — M1990 Unspecified osteoarthritis, unspecified site: Secondary | ICD-10-CM | POA: Insufficient documentation

## 2019-08-21 DIAGNOSIS — Z8249 Family history of ischemic heart disease and other diseases of the circulatory system: Secondary | ICD-10-CM | POA: Diagnosis not present

## 2019-08-21 DIAGNOSIS — T82510A Breakdown (mechanical) of surgically created arteriovenous fistula, initial encounter: Secondary | ICD-10-CM | POA: Insufficient documentation

## 2019-08-21 DIAGNOSIS — Z886 Allergy status to analgesic agent status: Secondary | ICD-10-CM | POA: Insufficient documentation

## 2019-08-21 DIAGNOSIS — I2510 Atherosclerotic heart disease of native coronary artery without angina pectoris: Secondary | ICD-10-CM | POA: Insufficient documentation

## 2019-08-21 DIAGNOSIS — E1122 Type 2 diabetes mellitus with diabetic chronic kidney disease: Secondary | ICD-10-CM | POA: Diagnosis not present

## 2019-08-21 DIAGNOSIS — E785 Hyperlipidemia, unspecified: Secondary | ICD-10-CM | POA: Diagnosis not present

## 2019-08-21 DIAGNOSIS — N186 End stage renal disease: Secondary | ICD-10-CM | POA: Insufficient documentation

## 2019-08-21 DIAGNOSIS — F419 Anxiety disorder, unspecified: Secondary | ICD-10-CM | POA: Insufficient documentation

## 2019-08-21 DIAGNOSIS — Z8673 Personal history of transient ischemic attack (TIA), and cerebral infarction without residual deficits: Secondary | ICD-10-CM | POA: Insufficient documentation

## 2019-08-21 DIAGNOSIS — T82898A Other specified complication of vascular prosthetic devices, implants and grafts, initial encounter: Secondary | ICD-10-CM | POA: Diagnosis not present

## 2019-08-21 DIAGNOSIS — I5022 Chronic systolic (congestive) heart failure: Secondary | ICD-10-CM | POA: Diagnosis not present

## 2019-08-21 DIAGNOSIS — Z7982 Long term (current) use of aspirin: Secondary | ICD-10-CM | POA: Diagnosis not present

## 2019-08-21 DIAGNOSIS — K219 Gastro-esophageal reflux disease without esophagitis: Secondary | ICD-10-CM | POA: Insufficient documentation

## 2019-08-21 DIAGNOSIS — Y841 Kidney dialysis as the cause of abnormal reaction of the patient, or of later complication, without mention of misadventure at the time of the procedure: Secondary | ICD-10-CM | POA: Diagnosis not present

## 2019-08-21 DIAGNOSIS — I252 Old myocardial infarction: Secondary | ICD-10-CM | POA: Diagnosis not present

## 2019-08-21 DIAGNOSIS — Z885 Allergy status to narcotic agent status: Secondary | ICD-10-CM | POA: Insufficient documentation

## 2019-08-21 DIAGNOSIS — I120 Hypertensive chronic kidney disease with stage 5 chronic kidney disease or end stage renal disease: Secondary | ICD-10-CM | POA: Insufficient documentation

## 2019-08-21 DIAGNOSIS — Z992 Dependence on renal dialysis: Secondary | ICD-10-CM | POA: Diagnosis not present

## 2019-08-21 DIAGNOSIS — Z794 Long term (current) use of insulin: Secondary | ICD-10-CM | POA: Diagnosis not present

## 2019-08-21 DIAGNOSIS — E1151 Type 2 diabetes mellitus with diabetic peripheral angiopathy without gangrene: Secondary | ICD-10-CM | POA: Diagnosis not present

## 2019-08-21 DIAGNOSIS — Z79899 Other long term (current) drug therapy: Secondary | ICD-10-CM | POA: Diagnosis not present

## 2019-08-21 DIAGNOSIS — N185 Chronic kidney disease, stage 5: Secondary | ICD-10-CM

## 2019-08-21 DIAGNOSIS — I132 Hypertensive heart and chronic kidney disease with heart failure and with stage 5 chronic kidney disease, or end stage renal disease: Secondary | ICD-10-CM | POA: Diagnosis not present

## 2019-08-21 HISTORY — DX: Unspecified cataract: H26.9

## 2019-08-21 HISTORY — DX: Complete loss of teeth, unspecified cause, unspecified class: K08.109

## 2019-08-21 HISTORY — PX: REVISION OF ARTERIOVENOUS GORETEX GRAFT: SHX6073

## 2019-08-21 HISTORY — DX: Presence of dental prosthetic device (complete) (partial): Z97.2

## 2019-08-21 LAB — POCT I-STAT, CHEM 8
BUN: 34 mg/dL — ABNORMAL HIGH (ref 8–23)
Calcium, Ion: 1.12 mmol/L — ABNORMAL LOW (ref 1.15–1.40)
Chloride: 106 mmol/L (ref 98–111)
Creatinine, Ser: 4 mg/dL — ABNORMAL HIGH (ref 0.44–1.00)
Glucose, Bld: 75 mg/dL (ref 70–99)
HCT: 37 % (ref 36.0–46.0)
Hemoglobin: 12.6 g/dL (ref 12.0–15.0)
Potassium: 4.6 mmol/L (ref 3.5–5.1)
Sodium: 140 mmol/L (ref 135–145)
TCO2: 29 mmol/L (ref 22–32)

## 2019-08-21 LAB — GLUCOSE, CAPILLARY
Glucose-Capillary: 70 mg/dL (ref 70–99)
Glucose-Capillary: 79 mg/dL (ref 70–99)

## 2019-08-21 SURGERY — REVISION OF ARTERIOVENOUS GORETEX GRAFT
Anesthesia: General | Laterality: Right

## 2019-08-21 MED ORDER — TRAMADOL HCL 50 MG PO TABS: 50.0000 mg | ORAL_TABLET | Freq: Four times a day (QID) | ORAL | 0 refills | Status: DC | PRN

## 2019-08-21 MED ORDER — MIDAZOLAM HCL 5 MG/5ML IJ SOLN
INTRAMUSCULAR | Status: DC | PRN
  Administered 2019-08-21: 2 mg via INTRAVENOUS

## 2019-08-21 MED ORDER — CHLORHEXIDINE GLUCONATE 4 % EX LIQD: 60.0000 mL | Freq: Once | CUTANEOUS | Status: DC

## 2019-08-21 MED ORDER — PROMETHAZINE HCL 25 MG/ML IJ SOLN: 6.2500 mg | INTRAMUSCULAR | Status: DC | PRN

## 2019-08-21 MED ORDER — FENTANYL CITRATE (PF) 100 MCG/2ML IJ SOLN: 25.0000 ug | INTRAMUSCULAR | Status: DC | PRN

## 2019-08-21 MED ORDER — SODIUM CHLORIDE 0.9 % IV SOLN
INTRAVENOUS | Status: AC
  Filled 2019-08-21: qty 1.2

## 2019-08-21 MED ORDER — PROPOFOL 10 MG/ML IV BOLUS
INTRAVENOUS | Status: DC | PRN
  Administered 2019-08-21: 120 mg via INTRAVENOUS

## 2019-08-21 MED ORDER — ACETAMINOPHEN 500 MG PO TABS
1000.0000 mg | ORAL_TABLET | Freq: Once | ORAL | Status: AC
  Administered 2019-08-21: 1000 mg via ORAL
  Filled 2019-08-21: qty 2

## 2019-08-21 MED ORDER — DEXAMETHASONE SODIUM PHOSPHATE 4 MG/ML IJ SOLN
INTRAMUSCULAR | Status: DC | PRN
  Administered 2019-08-21: 5 mg via INTRAVENOUS

## 2019-08-21 MED ORDER — ONDANSETRON HCL 4 MG/2ML IJ SOLN
INTRAMUSCULAR | Status: DC | PRN
  Administered 2019-08-21: 4 mg via INTRAVENOUS

## 2019-08-21 MED ORDER — PROPOFOL 10 MG/ML IV BOLUS
INTRAVENOUS | Status: AC
  Filled 2019-08-21: qty 20

## 2019-08-21 MED ORDER — FENTANYL CITRATE (PF) 100 MCG/2ML IJ SOLN
INTRAMUSCULAR | Status: DC | PRN
  Administered 2019-08-21 (×2): 25 ug via INTRAVENOUS

## 2019-08-21 MED ORDER — EPHEDRINE SULFATE 50 MG/ML IJ SOLN
INTRAMUSCULAR | Status: DC | PRN
  Administered 2019-08-21: 10 mg via INTRAVENOUS
  Administered 2019-08-21: 5 mg via INTRAVENOUS

## 2019-08-21 MED ORDER — CELECOXIB 200 MG PO CAPS
400.0000 mg | ORAL_CAPSULE | Freq: Once | ORAL | Status: AC
  Administered 2019-08-21: 400 mg via ORAL
  Filled 2019-08-21: qty 2

## 2019-08-21 MED ORDER — MIDAZOLAM HCL 2 MG/2ML IJ SOLN
INTRAMUSCULAR | Status: AC
  Filled 2019-08-21: qty 2

## 2019-08-21 MED ORDER — ONDANSETRON HCL 4 MG/2ML IJ SOLN
INTRAMUSCULAR | Status: AC
  Filled 2019-08-21: qty 2

## 2019-08-21 MED ORDER — 0.9 % SODIUM CHLORIDE (POUR BTL) OPTIME
TOPICAL | Status: DC | PRN
  Administered 2019-08-21: 1000 mL

## 2019-08-21 MED ORDER — SODIUM CHLORIDE 0.9 % IV SOLN
INTRAVENOUS | Status: DC | PRN
  Administered 2019-08-21: 500 mL

## 2019-08-21 MED ORDER — SODIUM CHLORIDE 0.9 % IV SOLN
INTRAVENOUS | Status: DC
  Administered 2019-08-21: 10 mL/h via INTRAVENOUS

## 2019-08-21 MED ORDER — LIDOCAINE HCL (CARDIAC) PF 100 MG/5ML IV SOSY
PREFILLED_SYRINGE | INTRAVENOUS | Status: DC | PRN
  Administered 2019-08-21: 100 mg via INTRAVENOUS

## 2019-08-21 MED ORDER — CEFAZOLIN SODIUM-DEXTROSE 2-4 GM/100ML-% IV SOLN
2.0000 g | INTRAVENOUS | Status: AC
  Administered 2019-08-21: 2 g via INTRAVENOUS
  Filled 2019-08-21: qty 100

## 2019-08-21 MED ORDER — FENTANYL CITRATE (PF) 250 MCG/5ML IJ SOLN
INTRAMUSCULAR | Status: AC
  Filled 2019-08-21: qty 5

## 2019-08-21 MED ORDER — PHENYLEPHRINE HCL-NACL 10-0.9 MG/250ML-% IV SOLN
INTRAVENOUS | Status: DC | PRN
  Administered 2019-08-21: 25 ug/min via INTRAVENOUS

## 2019-08-21 SURGICAL SUPPLY — 34 items
ADH SKN CLS APL DERMABOND .7 (GAUZE/BANDAGES/DRESSINGS) ×1
CANISTER SUCT 3000ML PPV (MISCELLANEOUS) ×2 IMPLANT
CANNULA VESSEL 3MM 2 BLNT TIP (CANNULA) ×2 IMPLANT
CLIP LIGATING EXTRA MED SLVR (CLIP) ×2 IMPLANT
CLIP LIGATING EXTRA SM BLUE (MISCELLANEOUS) ×2 IMPLANT
COVER PROBE W GEL 5X96 (DRAPES) ×1 IMPLANT
COVER WAND RF STERILE (DRAPES) ×2 IMPLANT
DECANTER SPIKE VIAL GLASS SM (MISCELLANEOUS) ×2 IMPLANT
DERMABOND ADVANCED (GAUZE/BANDAGES/DRESSINGS) ×1
DERMABOND ADVANCED .7 DNX12 (GAUZE/BANDAGES/DRESSINGS) ×1 IMPLANT
ELECT REM PT RETURN 9FT ADLT (ELECTROSURGICAL) ×2
ELECTRODE REM PT RTRN 9FT ADLT (ELECTROSURGICAL) ×1 IMPLANT
GLOVE BIOGEL PI IND STRL 7.0 (GLOVE) IMPLANT
GLOVE BIOGEL PI INDICATOR 7.0 (GLOVE) ×1
GLOVE SS BIOGEL STRL SZ 7.5 (GLOVE) ×1 IMPLANT
GLOVE SUPERSENSE BIOGEL SZ 7.5 (GLOVE) ×1
GLOVE SURG SS PI 6.5 STRL IVOR (GLOVE) ×1 IMPLANT
GOWN STRL REUS W/ TWL LRG LVL3 (GOWN DISPOSABLE) ×3 IMPLANT
GOWN STRL REUS W/TWL LRG LVL3 (GOWN DISPOSABLE) ×10
KIT BASIN OR (CUSTOM PROCEDURE TRAY) ×2 IMPLANT
KIT TURNOVER KIT B (KITS) ×2 IMPLANT
NDL HYPO 25GX1X1/2 BEV (NEEDLE) ×1 IMPLANT
NEEDLE HYPO 25GX1X1/2 BEV (NEEDLE) ×2 IMPLANT
NS IRRIG 1000ML POUR BTL (IV SOLUTION) ×2 IMPLANT
PACK CV ACCESS (CUSTOM PROCEDURE TRAY) ×2 IMPLANT
PAD ARMBOARD 7.5X6 YLW CONV (MISCELLANEOUS) ×4 IMPLANT
SUT PROLENE 6 0 CC (SUTURE) ×4 IMPLANT
SUT SILK 2 0 PERMA HAND 18 BK (SUTURE) IMPLANT
SUT SILK 2 0 SH (SUTURE) ×1 IMPLANT
SUT VIC AB 3-0 SH 27 (SUTURE) ×4
SUT VIC AB 3-0 SH 27X BRD (SUTURE) ×2 IMPLANT
TOWEL GREEN STERILE (TOWEL DISPOSABLE) ×2 IMPLANT
UNDERPAD 30X30 (UNDERPADS AND DIAPERS) ×2 IMPLANT
WATER STERILE IRR 1000ML POUR (IV SOLUTION) ×2 IMPLANT

## 2019-08-21 NOTE — Progress Notes (Signed)
Patient did not take any medication this morning prior to arrival. However, she did take her Coreg, Aspirin and Lipitor on arrival @ 0900. BP noted to be elevated at 225/79 @ 0854. BP now 138/47 @1039 .

## 2019-08-21 NOTE — Anesthesia Procedure Notes (Signed)
Procedure Name: LMA Insertion Date/Time: 08/21/2019 11:06 AM Performed by: Oletta Lamas, CRNA Pre-anesthesia Checklist: Patient identified, Emergency Drugs available, Suction available and Patient being monitored Patient Re-evaluated:Patient Re-evaluated prior to induction Oxygen Delivery Method: Circle System Utilized Preoxygenation: Pre-oxygenation with 100% oxygen Induction Type: IV induction Ventilation: Mask ventilation without difficulty LMA: LMA inserted LMA Size: 4.0 Number of attempts: 1 Placement Confirmation: positive ETCO2 Tube secured with: Tape Dental Injury: Teeth and Oropharynx as per pre-operative assessment

## 2019-08-21 NOTE — Discharge Instructions (Signed)
° °  [facility]  Discharge Instructions  AV Fistula or Graft Surgery for Dialysis Access  Please refer to the following instructions for your post-procedure care. Your surgeon or physician assistant will discuss any changes with you.  Activity  You may drive the day following your surgery, if you are comfortable and no longer taking prescription pain medication. Resume full activity as the soreness in your incision resolves.  Bathing/Showering  You may shower after you go home. Keep your incision dry for 48 hours. Do not soak in a bathtub, hot tub, or swim until the incision heals completely. You may not shower if you have a hemodialysis catheter.  Incision Care  Clean your incision with mild soap and water after 48 hours. Pat the area dry with a clean towel. You do not need a bandage unless otherwise instructed. Do not apply any ointments or creams to your incision. You may have skin glue on your incision. Do not peel it off. It will come off on its own in about one week. Your arm may swell a bit after surgery. To reduce swelling use pillows to elevate your arm so it is above your heart. Your doctor will tell you if you need to lightly wrap your arm with an ACE bandage.  Diet  Resume your normal diet. There are not special food restrictions following this procedure. In order to heal from your surgery, it is CRITICAL to get adequate nutrition. Your body requires vitamins, minerals, and protein. Vegetables are the best source of vitamins and minerals. Vegetables also provide the perfect balance of protein. Processed food has little nutritional value, so try to avoid this.  Medications  Resume taking all of your medications. If your incision is causing pain, you may take over-the counter pain relievers such as acetaminophen (Tylenol). If you were prescribed a stronger pain medication, please be aware these medications can cause nausea and constipation. Prevent  nausea by taking the medication with a snack or meal. Avoid constipation by drinking plenty of fluids and eating foods with high amount of fiber, such as fruits, vegetables, and grains.  Do not take Tylenol if you are taking prescription pain medications.  Follow up Your surgeon may want to see you in the office following your access surgery. If so, this will be arranged at the time of your surgery.  Please call us immediately for any of the following conditions:  Increased pain, redness, drainage (pus) from your incision site Fever of 101 degrees or higher Severe or worsening pain at your incision site Hand pain or numbness.  Reduce your risk of vascular disease:  Stop smoking. If you would like help, call [facility] at 1-800-QUIT-NOW 863-180-0537) or [place] at [place] your cholesterol Maintain a desired weight Control your diabetes Keep your blood pressure down  Dialysis  It will take several weeks to several months for your new dialysis access to be ready for use. Your surgeon will determine when it is okay to use it. Your nephrologist will continue to direct your dialysis. You can continue to use your Permcath until your new access is ready for use.   08/21/2019 SKYE NUNAMAKER [id number] 1951-01-25  Surgeon(s): Early, Arvilla Meres, MD  Procedure(s): REVISION OF ARTERIOVENOUS FISTULA RIGHT ARM  x Do not stick fistula for 4 weeks    If you have any questions, please call the office at [phone].

## 2019-08-21 NOTE — Anesthesia Postprocedure Evaluation (Signed)
Anesthesia Post Note  Patient: Jenna Brown  Procedure(s) Performed: REVISION OF ARTERIOVENOUS FISTULA RIGHT ARM (Right )     Patient location during evaluation: PACU Anesthesia Type: General Level of consciousness: sedated Pain management: pain level controlled Vital Signs Assessment: post-procedure vital signs reviewed and stable Respiratory status: spontaneous breathing and respiratory function stable Cardiovascular status: stable Postop Assessment: no apparent nausea or vomiting Anesthetic complications: no    Last Vitals:  Vitals:   08/21/19 1317 08/21/19 1331  BP: (!) 159/61 116/80  Pulse: 65 (!) 56  Resp: 19 (!) 9  Temp:  (!) 36 C  SpO2: 95% 94%    Last Pain:  Vitals:   08/21/19 1331  TempSrc:   PainSc: 0-No pain                 Christipher Rieger,Credit DANIEL

## 2019-08-21 NOTE — Anesthesia Preprocedure Evaluation (Addendum)
Anesthesia Evaluation  Patient identified by MRN, date of birth, ID band Patient awake    Reviewed: Allergy & Precautions, NPO status , Patient's Chart, lab work & pertinent test results  History of Anesthesia Complications (+) PONV and history of anesthetic complications  Airway Mallampati: II  TM Distance: >3 FB Neck ROM: Full    Dental no notable dental hx. (+) Dental Advisory Given   Pulmonary neg pulmonary ROS, former smoker,    Pulmonary exam normal breath sounds clear to auscultation       Cardiovascular hypertension, Pt. on medications + angina + CAD, + Past MI and + Peripheral Vascular Disease  Normal cardiovascular exam Rhythm:Regular Rate:Normal  TTE 2019 - EF using wall tracking approximately 45%. Hypokinesis of distal inferior, inferolateral, inferoseptal, and apical walls. Wall motion visually appears similar to prior. Thickened LCC of aortic valve appears similar to prior.  LHC 2016 Prox LAD to Mid LAD lesion, 90% stenosed. Ost Cx to Mid Cx lesion, 95% stenosed. Ost 2nd Mrg to 2nd Mrg lesion, 80% stenosed. Mid Cx lesion, 90% stenosed. Prox RCA lesion, 60% stenosed. Mid RCA lesion, 50% stenosed.   Neuro/Psych PSYCHIATRIC DISORDERS Anxiety CVA    GI/Hepatic Neg liver ROS, GERD  ,  Endo/Other  negative endocrine ROSdiabetes, Insulin Dependent  Renal/GU ESRF and DialysisRenal disease (K+ 4.0)  negative genitourinary   Musculoskeletal negative musculoskeletal ROS (+)   Abdominal   Peds  Hematology  (+) Blood dyscrasia, anemia ,   Anesthesia Other Findings   Reproductive/Obstetrics                            Anesthesia Physical  Anesthesia Plan  ASA: III  Anesthesia Plan: General   Post-op Pain Management:    Induction: Intravenous  PONV Risk Score and Plan: 4 or greater and Treatment may vary due to age or medical condition, Midazolam, Ondansetron and  Dexamethasone  Airway Management Planned: LMA  Additional Equipment:   Intra-op Plan:   Post-operative Plan: Extubation in OR  Informed Consent: I have reviewed the patients History and Physical, chart, labs and discussed the procedure including the risks, benefits and alternatives for the proposed anesthesia with the patient or authorized representative who has indicated his/her understanding and acceptance.     Dental advisory given  Plan Discussed with: CRNA, Anesthesiologist and Surgeon  Anesthesia Plan Comments:        Anesthesia Quick Evaluation

## 2019-08-21 NOTE — Op Note (Signed)
    OPERATIVE REPORT  DATE OF SURGERY: 08/21/2019  PATIENT: Jenna Brown, 68 y.o. female MRN: [id number]  DOB: 1950/12/13  PRE-OPERATIVE DIAGNOSIS: End-stage renal disease with poorly functioning right upper arm AV fistula  POST-OPERATIVE DIAGNOSIS:  Same  PROCEDURE: Translocation of cephalic vein of right brachiocephalic AV fistula  SURGEON:  Curt Jews, M.D.  PHYSICIAN ASSISTANT: Liana Crocker, PA-C  ANESTHESIA: General  EBL: per anesthesia record  Total I/O In: 300 [I.V.:300] Out: 40 [Blood:40]  BLOOD ADMINISTERED: none  DRAINS: none  SPECIMEN: none  COUNTS CORRECT:  YES  PATIENT DISPOSITION:  PACU - hemodynamically stable  PROCEDURE DETAILS: Patient was taken to the operating placed supine position where the area of the right arm right axilla were prepped and draped in usual sterile fashion.  SonoSite ultrasound was used to visualize the vein which was marked on the surface of the skin.  The patient had excellent maturation and size of her cephalic vein but it ran deep to the fat and fascia.  The vein was mobilized circumferentially from the level of antecubital space to the deltopectoral groove.  Tributary branches were ligated with 3-0 and 4-0 silk ties and divided.  The vein was occluded near the brachial artery anastomosis and the vein was transected and the vein was brought out through the tunnel.  The vein was marked to reduce risk of twisting.  A straight Gore tunneler was used to create a new subcuticular tunnel from the antecubital space to the shoulder.  This was through the slightly more medial than the native location of the cephalic vein.  The vein was then sewn into into itself with a running 6-0 Prolene suture.  Clamps removed and excellent thrill was noted.  Wounds irrigated with saline.  Hemostasis left cautery.  Wounds were closed with 3-0 Vicryl in the subcutaneous and subcuticular tissue.  Sterile dressing was applied and the patient was taken to the  recovery room in stable condition   Rosetta Posner, M.D., [facility] 08/21/2019 1:38 PM

## 2019-08-21 NOTE — Interval H&P Note (Signed)
History and Physical Interval Note:  08/21/2019 8:44 AM  Jenna Brown  has presented today for surgery, with the diagnosis of COMPLICATION OF ARTERIOVENOUS FISTULA RIGHT ARM.  The various methods of treatment have been discussed with the patient and family. After consideration of risks, benefits and other options for treatment, the patient has consented to  Procedure(s): REVISION OF ARTERIOVENOUS FISTULA RIGHT ARM (Right) as a surgical intervention.  The patient's history has been reviewed, patient examined, no change in status, stable for surgery.  I have reviewed the patient's chart and labs.  Questions were answered to the patient's satisfaction.     Curt Jews

## 2019-08-21 NOTE — Transfer of Care (Signed)
Immediate Anesthesia Transfer of Care Note  Patient: ALEKSA ARAMBUL  Procedure(s) Performed: REVISION OF ARTERIOVENOUS FISTULA RIGHT ARM (Right )  Patient Location: PACU  Anesthesia Type:General  Level of Consciousness: awake, drowsy, patient cooperative and responds to stimulation  Airway & Oxygen Therapy: Patient Spontanous Breathing and Patient connected to face mask oxygen  Post-op Assessment: Report given to RN and Post -op Vital signs reviewed and stable  Post vital signs: Reviewed and stable  Last Vitals:  Vitals Value Taken Time  BP 148/42 08/21/19 1302  Temp 36.4 C 08/21/19 1302  Pulse 60 08/21/19 1307  Resp 19 08/21/19 1307  SpO2 94 % 08/21/19 1307  Vitals shown include unvalidated device data.  Last Pain:  Vitals:   08/21/19 1302  TempSrc:   PainSc: (P) 0-No pain      Patients Stated Pain Goal: 3 ([id number])  Complications: No apparent anesthesia complications

## 2019-08-22 ENCOUNTER — Encounter (HOSPITAL_COMMUNITY): Payer: Self-pay | Admitting: Vascular Surgery

## 2019-09-10 ENCOUNTER — Ambulatory Visit: Payer: Medicare Other

## 2019-09-21 DIAGNOSIS — E785 Hyperlipidemia, unspecified: Secondary | ICD-10-CM | POA: Diagnosis not present

## 2019-09-21 DIAGNOSIS — D509 Iron deficiency anemia, unspecified: Secondary | ICD-10-CM | POA: Diagnosis not present

## 2019-09-21 DIAGNOSIS — E1121 Type 2 diabetes mellitus with diabetic nephropathy: Secondary | ICD-10-CM | POA: Diagnosis not present

## 2019-09-21 DIAGNOSIS — N2581 Secondary hyperparathyroidism of renal origin: Secondary | ICD-10-CM | POA: Diagnosis not present

## 2019-09-21 DIAGNOSIS — N186 End stage renal disease: Secondary | ICD-10-CM | POA: Diagnosis not present

## 2019-09-21 DIAGNOSIS — D631 Anemia in chronic kidney disease: Secondary | ICD-10-CM | POA: Diagnosis not present

## 2019-09-23 DIAGNOSIS — N186 End stage renal disease: Secondary | ICD-10-CM | POA: Diagnosis not present

## 2019-09-23 DIAGNOSIS — D631 Anemia in chronic kidney disease: Secondary | ICD-10-CM | POA: Diagnosis not present

## 2019-09-23 DIAGNOSIS — E1121 Type 2 diabetes mellitus with diabetic nephropathy: Secondary | ICD-10-CM | POA: Diagnosis not present

## 2019-09-23 DIAGNOSIS — D509 Iron deficiency anemia, unspecified: Secondary | ICD-10-CM | POA: Diagnosis not present

## 2019-09-23 DIAGNOSIS — N2581 Secondary hyperparathyroidism of renal origin: Secondary | ICD-10-CM | POA: Diagnosis not present

## 2019-09-23 DIAGNOSIS — E785 Hyperlipidemia, unspecified: Secondary | ICD-10-CM | POA: Diagnosis not present

## 2019-09-25 ENCOUNTER — Ambulatory Visit: Payer: Medicare Other | Admitting: Family

## 2019-09-25 DIAGNOSIS — N186 End stage renal disease: Secondary | ICD-10-CM | POA: Diagnosis not present

## 2019-09-25 DIAGNOSIS — N2581 Secondary hyperparathyroidism of renal origin: Secondary | ICD-10-CM | POA: Diagnosis not present

## 2019-09-25 DIAGNOSIS — D509 Iron deficiency anemia, unspecified: Secondary | ICD-10-CM | POA: Diagnosis not present

## 2019-09-25 DIAGNOSIS — D631 Anemia in chronic kidney disease: Secondary | ICD-10-CM | POA: Diagnosis not present

## 2019-09-25 DIAGNOSIS — E1121 Type 2 diabetes mellitus with diabetic nephropathy: Secondary | ICD-10-CM | POA: Diagnosis not present

## 2019-09-25 DIAGNOSIS — E785 Hyperlipidemia, unspecified: Secondary | ICD-10-CM | POA: Diagnosis not present

## 2019-09-27 DIAGNOSIS — D509 Iron deficiency anemia, unspecified: Secondary | ICD-10-CM | POA: Diagnosis not present

## 2019-09-27 DIAGNOSIS — N2581 Secondary hyperparathyroidism of renal origin: Secondary | ICD-10-CM | POA: Diagnosis not present

## 2019-09-27 DIAGNOSIS — E785 Hyperlipidemia, unspecified: Secondary | ICD-10-CM | POA: Diagnosis not present

## 2019-09-27 DIAGNOSIS — N186 End stage renal disease: Secondary | ICD-10-CM | POA: Diagnosis not present

## 2019-09-27 DIAGNOSIS — E1121 Type 2 diabetes mellitus with diabetic nephropathy: Secondary | ICD-10-CM | POA: Diagnosis not present

## 2019-09-27 DIAGNOSIS — D631 Anemia in chronic kidney disease: Secondary | ICD-10-CM | POA: Diagnosis not present

## 2019-09-30 ENCOUNTER — Telehealth (HOSPITAL_COMMUNITY): Payer: Self-pay

## 2019-09-30 DIAGNOSIS — D631 Anemia in chronic kidney disease: Secondary | ICD-10-CM | POA: Diagnosis not present

## 2019-09-30 DIAGNOSIS — N186 End stage renal disease: Secondary | ICD-10-CM | POA: Diagnosis not present

## 2019-09-30 DIAGNOSIS — N2581 Secondary hyperparathyroidism of renal origin: Secondary | ICD-10-CM | POA: Diagnosis not present

## 2019-09-30 DIAGNOSIS — D509 Iron deficiency anemia, unspecified: Secondary | ICD-10-CM | POA: Diagnosis not present

## 2019-09-30 DIAGNOSIS — E785 Hyperlipidemia, unspecified: Secondary | ICD-10-CM | POA: Diagnosis not present

## 2019-09-30 DIAGNOSIS — E1121 Type 2 diabetes mellitus with diabetic nephropathy: Secondary | ICD-10-CM | POA: Diagnosis not present

## 2019-09-30 NOTE — Telephone Encounter (Signed)

## 2019-10-01 ENCOUNTER — Ambulatory Visit (INDEPENDENT_AMBULATORY_CARE_PROVIDER_SITE_OTHER): Payer: Self-pay | Admitting: Physician Assistant

## 2019-10-01 ENCOUNTER — Other Ambulatory Visit: Payer: Self-pay

## 2019-10-01 VITALS — BP 164/71 | HR 71 | Temp 97.1°F | Resp 14 | Ht 64.0 in | Wt 152.0 lb

## 2019-10-01 DIAGNOSIS — N186 End stage renal disease: Secondary | ICD-10-CM

## 2019-10-01 DIAGNOSIS — Z992 Dependence on renal dialysis: Secondary | ICD-10-CM

## 2019-10-01 NOTE — Progress Notes (Signed)
POST OPERATIVE OFFICE NOTE    CC:  F/u for surgery-4 weeks  HPI:  This is a 69 y.o. female who is s/p translocation of cephalic vein of right brachiocephalic AV fistula on [id number] by Dr. Donnetta Hutching.  Creation of the fistula was performed on 03/04/2019.  She denies hand pain or numbness, fever or chills or left lower extremity rest pain.  She dialyzes via right IJ tunnel catheter.  No issues with catheter.  Patient is status post right AKA.  She ambulates via wheelchair but is able to stand on her left extremity.  Dialysis clinic: [facility], [employer] Treatment days: MWF  Allergies  Allergen Reactions  . Plavix [Clopidogrel Bisulfate] Itching  . Codeine Other (See Comments)    "makes me feel strange"  . Tylenol With Codeine #3 [Acetaminophen-Codeine] Other (See Comments)    "doesn't make me feel right"    Current Outpatient Medications  Medication Sig Dispense Refill  . acetaminophen (TYLENOL) 500 MG tablet Take 500 mg by mouth every 6 (six) hours as needed for mild pain.    Marland Kitchen ALPRAZolam (XANAX) 0.25 MG tablet Take 1 tablet (0.25 mg total) by mouth 3 (three) times daily as needed for anxiety. (Patient taking differently: Take 0.25 mg by mouth daily as needed for anxiety. ) 12 tablet 0  . aspirin 81 MG chewable tablet Chew 81 mg by mouth daily.    Marland Kitchen atorvastatin (LIPITOR) 80 MG tablet Take 80 mg by mouth daily.     . carvedilol (COREG) 12.5 MG tablet Take 1.5 tablets (18.75 mg total) by mouth 2 (two) times daily with a meal. One and a half tablets twice daily. 270 tablet 3  . cholecalciferol (VITAMIN D) 25 MCG (1000 UT) tablet Take 1,000 Units by mouth daily.    . insulin glargine (LANTUS) 100 UNIT/ML injection Inject 0.2 mLs (20 Units total) into the skin at bedtime. 10 mL 11  . multivitamin (RENA-VIT) TABS tablet Take 1 tablet by mouth at bedtime. (Patient not taking: Reported on 08/08/2019) 30 tablet 2  . nitroGLYCERIN (NITROSTAT) 0.4 MG SL tablet Place 1 tablet (0.4 mg  total) under the tongue every 5 (five) minutes x 3 doses as needed for chest pain. 25 tablet prn  . RENVELA 800 MG tablet Take 800 mg by mouth See admin instructions. Take 1 tablet (800 mg) by mouth with each meal up to 5 times daily    . sodium bicarbonate 650 MG tablet Take 650 mg by mouth 2 (two) times daily.     . traMADol (ULTRAM) 50 MG tablet Take 1 tablet (50 mg total) by mouth every 6 (six) hours as needed. 20 tablet 0   No current facility-administered medications for this visit.     ROS:  See HPI  Physical Exam:  Vitals:   10/01/19 1516  Weight: 152 lb (68.9 kg)  Height: 5\' 4"  (1.626 m)    Incision: Well-healed right upper extremity incisions Extremities: 2+ radial pulse.  5 out of 5 grip strength.  Good bruit and thrill in fistula.  The fistula is palpable along its course in the upper arm.  She has 2+ dependent pitting edema of the left ankle and foot.  She has brisk phasic dorsalis pedis, posterior tibial and peroneal artery signals Neuro: Alert and oriented x4   Assessment/Plan:  This is a 69 y.o. female who is s/p: Transposition right upper extremity brachiocephalic AV fistula.  Okay to begin cannulating fistula.  We discussed that once nephrology service satisfied that her fistula  is functioning appropriately, we will plan to discontinue her tunneled dialysis catheter.  We will see her in 1 year for follow-up PAD with left or lower extremity ABIs.   Jannet Mantis, PA-C [facility] [phone]  Clinic MD:  Early

## 2019-10-02 ENCOUNTER — Other Ambulatory Visit: Payer: Self-pay | Admitting: *Deleted

## 2019-10-02 DIAGNOSIS — E785 Hyperlipidemia, unspecified: Secondary | ICD-10-CM | POA: Diagnosis not present

## 2019-10-02 DIAGNOSIS — I779 Disorder of arteries and arterioles, unspecified: Secondary | ICD-10-CM

## 2019-10-02 DIAGNOSIS — E1121 Type 2 diabetes mellitus with diabetic nephropathy: Secondary | ICD-10-CM | POA: Diagnosis not present

## 2019-10-02 DIAGNOSIS — D509 Iron deficiency anemia, unspecified: Secondary | ICD-10-CM | POA: Diagnosis not present

## 2019-10-02 DIAGNOSIS — N186 End stage renal disease: Secondary | ICD-10-CM | POA: Diagnosis not present

## 2019-10-02 DIAGNOSIS — D631 Anemia in chronic kidney disease: Secondary | ICD-10-CM | POA: Diagnosis not present

## 2019-10-02 DIAGNOSIS — N2581 Secondary hyperparathyroidism of renal origin: Secondary | ICD-10-CM | POA: Diagnosis not present

## 2019-10-04 DIAGNOSIS — E1121 Type 2 diabetes mellitus with diabetic nephropathy: Secondary | ICD-10-CM | POA: Diagnosis not present

## 2019-10-04 DIAGNOSIS — N2581 Secondary hyperparathyroidism of renal origin: Secondary | ICD-10-CM | POA: Diagnosis not present

## 2019-10-04 DIAGNOSIS — D631 Anemia in chronic kidney disease: Secondary | ICD-10-CM | POA: Diagnosis not present

## 2019-10-04 DIAGNOSIS — D509 Iron deficiency anemia, unspecified: Secondary | ICD-10-CM | POA: Diagnosis not present

## 2019-10-04 DIAGNOSIS — N186 End stage renal disease: Secondary | ICD-10-CM | POA: Diagnosis not present

## 2019-10-04 DIAGNOSIS — E785 Hyperlipidemia, unspecified: Secondary | ICD-10-CM | POA: Diagnosis not present

## 2019-10-07 DIAGNOSIS — E785 Hyperlipidemia, unspecified: Secondary | ICD-10-CM | POA: Diagnosis not present

## 2019-10-07 DIAGNOSIS — D509 Iron deficiency anemia, unspecified: Secondary | ICD-10-CM | POA: Diagnosis not present

## 2019-10-07 DIAGNOSIS — N2581 Secondary hyperparathyroidism of renal origin: Secondary | ICD-10-CM | POA: Diagnosis not present

## 2019-10-07 DIAGNOSIS — E1121 Type 2 diabetes mellitus with diabetic nephropathy: Secondary | ICD-10-CM | POA: Diagnosis not present

## 2019-10-07 DIAGNOSIS — N186 End stage renal disease: Secondary | ICD-10-CM | POA: Diagnosis not present

## 2019-10-07 DIAGNOSIS — D631 Anemia in chronic kidney disease: Secondary | ICD-10-CM | POA: Diagnosis not present

## 2019-10-09 DIAGNOSIS — N2581 Secondary hyperparathyroidism of renal origin: Secondary | ICD-10-CM | POA: Diagnosis not present

## 2019-10-09 DIAGNOSIS — N186 End stage renal disease: Secondary | ICD-10-CM | POA: Diagnosis not present

## 2019-10-09 DIAGNOSIS — E785 Hyperlipidemia, unspecified: Secondary | ICD-10-CM | POA: Diagnosis not present

## 2019-10-09 DIAGNOSIS — E1121 Type 2 diabetes mellitus with diabetic nephropathy: Secondary | ICD-10-CM | POA: Diagnosis not present

## 2019-10-09 DIAGNOSIS — D509 Iron deficiency anemia, unspecified: Secondary | ICD-10-CM | POA: Diagnosis not present

## 2019-10-09 DIAGNOSIS — D631 Anemia in chronic kidney disease: Secondary | ICD-10-CM | POA: Diagnosis not present

## 2019-10-11 DIAGNOSIS — E785 Hyperlipidemia, unspecified: Secondary | ICD-10-CM | POA: Diagnosis not present

## 2019-10-11 DIAGNOSIS — D509 Iron deficiency anemia, unspecified: Secondary | ICD-10-CM | POA: Diagnosis not present

## 2019-10-11 DIAGNOSIS — N186 End stage renal disease: Secondary | ICD-10-CM | POA: Diagnosis not present

## 2019-10-11 DIAGNOSIS — E1121 Type 2 diabetes mellitus with diabetic nephropathy: Secondary | ICD-10-CM | POA: Diagnosis not present

## 2019-10-11 DIAGNOSIS — D631 Anemia in chronic kidney disease: Secondary | ICD-10-CM | POA: Diagnosis not present

## 2019-10-11 DIAGNOSIS — N2581 Secondary hyperparathyroidism of renal origin: Secondary | ICD-10-CM | POA: Diagnosis not present

## 2019-10-14 DIAGNOSIS — E785 Hyperlipidemia, unspecified: Secondary | ICD-10-CM | POA: Diagnosis not present

## 2019-10-14 DIAGNOSIS — E1121 Type 2 diabetes mellitus with diabetic nephropathy: Secondary | ICD-10-CM | POA: Diagnosis not present

## 2019-10-14 DIAGNOSIS — N2581 Secondary hyperparathyroidism of renal origin: Secondary | ICD-10-CM | POA: Diagnosis not present

## 2019-10-14 DIAGNOSIS — N186 End stage renal disease: Secondary | ICD-10-CM | POA: Diagnosis not present

## 2019-10-14 DIAGNOSIS — D509 Iron deficiency anemia, unspecified: Secondary | ICD-10-CM | POA: Diagnosis not present

## 2019-10-14 DIAGNOSIS — D631 Anemia in chronic kidney disease: Secondary | ICD-10-CM | POA: Diagnosis not present

## 2019-10-16 DIAGNOSIS — Z114 Encounter for screening for human immunodeficiency virus [HIV]: Secondary | ICD-10-CM | POA: Diagnosis not present

## 2019-10-16 DIAGNOSIS — E1121 Type 2 diabetes mellitus with diabetic nephropathy: Secondary | ICD-10-CM | POA: Diagnosis not present

## 2019-10-16 DIAGNOSIS — D631 Anemia in chronic kidney disease: Secondary | ICD-10-CM | POA: Diagnosis not present

## 2019-10-16 DIAGNOSIS — E785 Hyperlipidemia, unspecified: Secondary | ICD-10-CM | POA: Diagnosis not present

## 2019-10-16 DIAGNOSIS — N2581 Secondary hyperparathyroidism of renal origin: Secondary | ICD-10-CM | POA: Diagnosis not present

## 2019-10-16 DIAGNOSIS — D509 Iron deficiency anemia, unspecified: Secondary | ICD-10-CM | POA: Diagnosis not present

## 2019-10-16 DIAGNOSIS — N186 End stage renal disease: Secondary | ICD-10-CM | POA: Diagnosis not present

## 2019-10-18 DIAGNOSIS — D631 Anemia in chronic kidney disease: Secondary | ICD-10-CM | POA: Diagnosis not present

## 2019-10-18 DIAGNOSIS — E1121 Type 2 diabetes mellitus with diabetic nephropathy: Secondary | ICD-10-CM | POA: Diagnosis not present

## 2019-10-18 DIAGNOSIS — N186 End stage renal disease: Secondary | ICD-10-CM | POA: Diagnosis not present

## 2019-10-18 DIAGNOSIS — N2581 Secondary hyperparathyroidism of renal origin: Secondary | ICD-10-CM | POA: Diagnosis not present

## 2019-10-18 DIAGNOSIS — D509 Iron deficiency anemia, unspecified: Secondary | ICD-10-CM | POA: Diagnosis not present

## 2019-10-18 DIAGNOSIS — E785 Hyperlipidemia, unspecified: Secondary | ICD-10-CM | POA: Diagnosis not present

## 2019-10-20 DIAGNOSIS — N186 End stage renal disease: Secondary | ICD-10-CM | POA: Diagnosis not present

## 2019-10-20 DIAGNOSIS — Z992 Dependence on renal dialysis: Secondary | ICD-10-CM | POA: Diagnosis not present

## 2019-10-21 DIAGNOSIS — D509 Iron deficiency anemia, unspecified: Secondary | ICD-10-CM | POA: Diagnosis not present

## 2019-10-21 DIAGNOSIS — E1121 Type 2 diabetes mellitus with diabetic nephropathy: Secondary | ICD-10-CM | POA: Diagnosis not present

## 2019-10-21 DIAGNOSIS — N2581 Secondary hyperparathyroidism of renal origin: Secondary | ICD-10-CM | POA: Diagnosis not present

## 2019-10-21 DIAGNOSIS — D631 Anemia in chronic kidney disease: Secondary | ICD-10-CM | POA: Diagnosis not present

## 2019-10-21 DIAGNOSIS — N186 End stage renal disease: Secondary | ICD-10-CM | POA: Diagnosis not present

## 2019-10-21 DIAGNOSIS — Z23 Encounter for immunization: Secondary | ICD-10-CM | POA: Diagnosis not present

## 2019-10-23 DIAGNOSIS — Z23 Encounter for immunization: Secondary | ICD-10-CM | POA: Diagnosis not present

## 2019-10-23 DIAGNOSIS — N186 End stage renal disease: Secondary | ICD-10-CM | POA: Diagnosis not present

## 2019-10-23 DIAGNOSIS — D509 Iron deficiency anemia, unspecified: Secondary | ICD-10-CM | POA: Diagnosis not present

## 2019-10-23 DIAGNOSIS — D631 Anemia in chronic kidney disease: Secondary | ICD-10-CM | POA: Diagnosis not present

## 2019-10-23 DIAGNOSIS — E1121 Type 2 diabetes mellitus with diabetic nephropathy: Secondary | ICD-10-CM | POA: Diagnosis not present

## 2019-10-23 DIAGNOSIS — N2581 Secondary hyperparathyroidism of renal origin: Secondary | ICD-10-CM | POA: Diagnosis not present

## 2019-10-25 DIAGNOSIS — D631 Anemia in chronic kidney disease: Secondary | ICD-10-CM | POA: Diagnosis not present

## 2019-10-25 DIAGNOSIS — Z23 Encounter for immunization: Secondary | ICD-10-CM | POA: Diagnosis not present

## 2019-10-25 DIAGNOSIS — N186 End stage renal disease: Secondary | ICD-10-CM | POA: Diagnosis not present

## 2019-10-25 DIAGNOSIS — N2581 Secondary hyperparathyroidism of renal origin: Secondary | ICD-10-CM | POA: Diagnosis not present

## 2019-10-25 DIAGNOSIS — D509 Iron deficiency anemia, unspecified: Secondary | ICD-10-CM | POA: Diagnosis not present

## 2019-10-25 DIAGNOSIS — E1121 Type 2 diabetes mellitus with diabetic nephropathy: Secondary | ICD-10-CM | POA: Diagnosis not present

## 2019-10-28 DIAGNOSIS — N2581 Secondary hyperparathyroidism of renal origin: Secondary | ICD-10-CM | POA: Diagnosis not present

## 2019-10-28 DIAGNOSIS — D631 Anemia in chronic kidney disease: Secondary | ICD-10-CM | POA: Diagnosis not present

## 2019-10-28 DIAGNOSIS — E1121 Type 2 diabetes mellitus with diabetic nephropathy: Secondary | ICD-10-CM | POA: Diagnosis not present

## 2019-10-28 DIAGNOSIS — Z23 Encounter for immunization: Secondary | ICD-10-CM | POA: Diagnosis not present

## 2019-10-28 DIAGNOSIS — N186 End stage renal disease: Secondary | ICD-10-CM | POA: Diagnosis not present

## 2019-10-28 DIAGNOSIS — D509 Iron deficiency anemia, unspecified: Secondary | ICD-10-CM | POA: Diagnosis not present

## 2019-10-30 DIAGNOSIS — D509 Iron deficiency anemia, unspecified: Secondary | ICD-10-CM | POA: Diagnosis not present

## 2019-10-30 DIAGNOSIS — D631 Anemia in chronic kidney disease: Secondary | ICD-10-CM | POA: Diagnosis not present

## 2019-10-30 DIAGNOSIS — N186 End stage renal disease: Secondary | ICD-10-CM | POA: Diagnosis not present

## 2019-10-30 DIAGNOSIS — E1121 Type 2 diabetes mellitus with diabetic nephropathy: Secondary | ICD-10-CM | POA: Diagnosis not present

## 2019-10-30 DIAGNOSIS — Z23 Encounter for immunization: Secondary | ICD-10-CM | POA: Diagnosis not present

## 2019-10-30 DIAGNOSIS — N2581 Secondary hyperparathyroidism of renal origin: Secondary | ICD-10-CM | POA: Diagnosis not present

## 2019-11-01 DIAGNOSIS — D631 Anemia in chronic kidney disease: Secondary | ICD-10-CM | POA: Diagnosis not present

## 2019-11-01 DIAGNOSIS — Z23 Encounter for immunization: Secondary | ICD-10-CM | POA: Diagnosis not present

## 2019-11-01 DIAGNOSIS — D509 Iron deficiency anemia, unspecified: Secondary | ICD-10-CM | POA: Diagnosis not present

## 2019-11-01 DIAGNOSIS — N2581 Secondary hyperparathyroidism of renal origin: Secondary | ICD-10-CM | POA: Diagnosis not present

## 2019-11-01 DIAGNOSIS — E1121 Type 2 diabetes mellitus with diabetic nephropathy: Secondary | ICD-10-CM | POA: Diagnosis not present

## 2019-11-01 DIAGNOSIS — N186 End stage renal disease: Secondary | ICD-10-CM | POA: Diagnosis not present

## 2019-11-04 DIAGNOSIS — N2581 Secondary hyperparathyroidism of renal origin: Secondary | ICD-10-CM | POA: Diagnosis not present

## 2019-11-04 DIAGNOSIS — Z23 Encounter for immunization: Secondary | ICD-10-CM | POA: Diagnosis not present

## 2019-11-04 DIAGNOSIS — D631 Anemia in chronic kidney disease: Secondary | ICD-10-CM | POA: Diagnosis not present

## 2019-11-04 DIAGNOSIS — D509 Iron deficiency anemia, unspecified: Secondary | ICD-10-CM | POA: Diagnosis not present

## 2019-11-04 DIAGNOSIS — N186 End stage renal disease: Secondary | ICD-10-CM | POA: Diagnosis not present

## 2019-11-04 DIAGNOSIS — E1121 Type 2 diabetes mellitus with diabetic nephropathy: Secondary | ICD-10-CM | POA: Diagnosis not present

## 2019-11-06 DIAGNOSIS — N186 End stage renal disease: Secondary | ICD-10-CM | POA: Diagnosis not present

## 2019-11-06 DIAGNOSIS — N2581 Secondary hyperparathyroidism of renal origin: Secondary | ICD-10-CM | POA: Diagnosis not present

## 2019-11-06 DIAGNOSIS — Z23 Encounter for immunization: Secondary | ICD-10-CM | POA: Diagnosis not present

## 2019-11-06 DIAGNOSIS — E1121 Type 2 diabetes mellitus with diabetic nephropathy: Secondary | ICD-10-CM | POA: Diagnosis not present

## 2019-11-06 DIAGNOSIS — D631 Anemia in chronic kidney disease: Secondary | ICD-10-CM | POA: Diagnosis not present

## 2019-11-06 DIAGNOSIS — D509 Iron deficiency anemia, unspecified: Secondary | ICD-10-CM | POA: Diagnosis not present

## 2019-11-08 DIAGNOSIS — D509 Iron deficiency anemia, unspecified: Secondary | ICD-10-CM | POA: Diagnosis not present

## 2019-11-08 DIAGNOSIS — D631 Anemia in chronic kidney disease: Secondary | ICD-10-CM | POA: Diagnosis not present

## 2019-11-08 DIAGNOSIS — Z23 Encounter for immunization: Secondary | ICD-10-CM | POA: Diagnosis not present

## 2019-11-08 DIAGNOSIS — N186 End stage renal disease: Secondary | ICD-10-CM | POA: Diagnosis not present

## 2019-11-08 DIAGNOSIS — N2581 Secondary hyperparathyroidism of renal origin: Secondary | ICD-10-CM | POA: Diagnosis not present

## 2019-11-08 DIAGNOSIS — E1121 Type 2 diabetes mellitus with diabetic nephropathy: Secondary | ICD-10-CM | POA: Diagnosis not present

## 2019-11-11 DIAGNOSIS — E1121 Type 2 diabetes mellitus with diabetic nephropathy: Secondary | ICD-10-CM | POA: Diagnosis not present

## 2019-11-11 DIAGNOSIS — D631 Anemia in chronic kidney disease: Secondary | ICD-10-CM | POA: Diagnosis not present

## 2019-11-11 DIAGNOSIS — N2581 Secondary hyperparathyroidism of renal origin: Secondary | ICD-10-CM | POA: Diagnosis not present

## 2019-11-11 DIAGNOSIS — D509 Iron deficiency anemia, unspecified: Secondary | ICD-10-CM | POA: Diagnosis not present

## 2019-11-11 DIAGNOSIS — Z23 Encounter for immunization: Secondary | ICD-10-CM | POA: Diagnosis not present

## 2019-11-11 DIAGNOSIS — N186 End stage renal disease: Secondary | ICD-10-CM | POA: Diagnosis not present

## 2019-11-13 DIAGNOSIS — D631 Anemia in chronic kidney disease: Secondary | ICD-10-CM | POA: Diagnosis not present

## 2019-11-13 DIAGNOSIS — N186 End stage renal disease: Secondary | ICD-10-CM | POA: Diagnosis not present

## 2019-11-13 DIAGNOSIS — D509 Iron deficiency anemia, unspecified: Secondary | ICD-10-CM | POA: Diagnosis not present

## 2019-11-13 DIAGNOSIS — N2581 Secondary hyperparathyroidism of renal origin: Secondary | ICD-10-CM | POA: Diagnosis not present

## 2019-11-13 DIAGNOSIS — Z23 Encounter for immunization: Secondary | ICD-10-CM | POA: Diagnosis not present

## 2019-11-13 DIAGNOSIS — E1121 Type 2 diabetes mellitus with diabetic nephropathy: Secondary | ICD-10-CM | POA: Diagnosis not present

## 2019-11-14 ENCOUNTER — Encounter: Payer: Self-pay | Admitting: Cardiology

## 2019-11-14 ENCOUNTER — Telehealth (INDEPENDENT_AMBULATORY_CARE_PROVIDER_SITE_OTHER): Payer: Medicare Other | Admitting: Cardiology

## 2019-11-14 VITALS — Ht 64.0 in

## 2019-11-14 DIAGNOSIS — N186 End stage renal disease: Secondary | ICD-10-CM

## 2019-11-14 DIAGNOSIS — I2510 Atherosclerotic heart disease of native coronary artery without angina pectoris: Secondary | ICD-10-CM

## 2019-11-14 DIAGNOSIS — I5042 Chronic combined systolic (congestive) and diastolic (congestive) heart failure: Secondary | ICD-10-CM

## 2019-11-14 DIAGNOSIS — E785 Hyperlipidemia, unspecified: Secondary | ICD-10-CM

## 2019-11-14 DIAGNOSIS — I739 Peripheral vascular disease, unspecified: Secondary | ICD-10-CM

## 2019-11-14 NOTE — Progress Notes (Signed)
Virtual Visit via Telephone Note   This visit type was conducted due to national recommendations for restrictions regarding the COVID-19 Pandemic (e.g. social distancing) in an effort to limit this patient's exposure and mitigate transmission in our community.  Due to her co-morbid illnesses, this patient is at least at moderate risk for complications without adequate follow up.  This format is felt to be most appropriate for this patient at this time.  The patient did not have access to video technology/had technical difficulties with video requiring transitioning to audio format only (telephone).  All issues noted in this document were discussed and addressed.  No physical exam could be performed with this format.  Please refer to the patient's chart for her  consent to telehealth for [facility].   Date:  11/14/2019   ID:  Jenna Brown, DOB Aug 26, 1951, MRN [id number]  Patient Location: Home Provider Location: Home  PCP:  [name], MD  Cardiologist:  Skeet Latch, MD  Electrophysiologist:  None   Evaluation Performed:  Follow-Up Visit  Chief Complaint:  none  History of Present Illness:    Jenna Brown is a pleasant 69 y.o. female with a history of severe coronary disease, treated medically.  Catheterization in 2016 showed a 90% proximal LAD, 95% in-stent restenosis in the proximal circumflex as well as mid circumflex of 90% and OM stenosis of 80%.  Her RCA is large and has a 50 to 60% narrowing.  She has a history of past combined heart failure, her echocardiogram in November 2019 showed an ejection fraction of 45%.  She has end-stage renal disease and is on dialysis.  Previously she has had issues with medical therapy secondary to hypotension but she tells me that recently this is been stable, she is able to take her carvedilol as prescribed.  She was admitted in June 2020 with acute on chronic heart failure.  She did have a right thoracentesis with 1.2 L removed.   Other medical issues include insulin-dependent diabetes and vascular disease.  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  The patient was last seen in the office in October 2020.  She was contacted today for routine follow-up.  She tells me she was actually on her way to dialysis.  She says she has been doing well, she denies any chest pain.  She tells me she is able to take her current dose of carvedilol and that her blood pressure has been stable at dialysis.  She denies any unusual chest pain or shortness of breath.  The patient does not have symptoms concerning for COVID-19 infection (fever, chills, cough, or new shortness of breath).    Past Medical History:  Diagnosis Date  . Anemia   . Anxiety    takes Xanax prn  . Arthritis   . Cataract    left eye  . CKD (chronic kidney disease), stage IV ([place])   . Coronary artery disease 2016   cath w/ 90% LAD, 95%CFX, 80% OM 2, 60% RCA, not CABG candidate, rx medically  . Full dentures   . GERD (gastroesophageal reflux disease)   . H/O hiatal hernia   . Headache(784.0)    when b/p is elevated  . Hemorrhoids   . Hx of AKA (above knee amputation), right ([place])   . Hyperlipidemia    takes Simvastatin daily  . Hypertension    takes Amlodipine/HCTZ/Losartan daily  . Migraines   . Myocardial infarction ([place]) 1990's  . NSTEMI (non-ST elevated myocardial infarction) (  [place])   . Peripheral vascular disease ([place])   . PONV (postoperative nausea and vomiting)   . Stroke [facility])    TIA history  . Type II diabetes mellitus (H[facility])    on Lantus  . Vertigo    takes Ativert prn   Past Surgical History:  Procedure Laterality Date  . ABDOMINAL AORTAGRAM N/A 02/29/2012   Procedure: ABDOMINAL Maxcine Ham;  Surgeon: [name], MD;  Location: [facility] CATH LAB;  Service: Cardiovascular;  Laterality: N/A;  . AMPUTATION  05/10/2012   Procedure: AMPUTATION ABOVE KNEE;  Surgeon: [name], MD;  Location: [facility] OR;  Service: Vascular;  Laterality: Right;   open  right groin wound noted  . aortogram  02/2012  . AV FISTULA PLACEMENT Right 03/04/2019   Procedure: ARTERIOVENOUS (AV) FISTULA CREATION;  Surgeon: Rosetta Posner, MD;  Location: [place];  Service: Vascular;  Laterality: Right;  . CARDIAC CATHETERIZATION N/A 04/13/2015   Procedure: Right/Left Heart Cath and Coronary Angiography;  Surgeon: [name], MD;  Location: [facility] CV LAB;  Service: Cardiovascular;  Laterality: N/A;  . CLEFT PALATE REPAIR     several  . COLONOSCOPY    . DILATION AND CURETTAGE OF UTERUS    . FEMORAL-POPLITEAL BYPASS GRAFT  04/05/2012   Procedure: BYPASS GRAFT FEMORAL-POPLITEAL ARTERY;  Surgeon: [name], MD;  Location: [place];  Service: Vascular;  Laterality: Right;  REVISION  . FEMORAL-TIBIAL BYPASS GRAFT  03/29/2012   Procedure: BYPASS GRAFT FEMORAL-TIBIAL ARTERY;  Surgeon: [name], MD;  Location: [place] OR;  Service: Vascular;  Laterality: Right;  Right femoral to Posterior Tibialis with composite graft of 48mm x 80 cm ringed gortex graft and saphenous vein  ,intraoperative arteriogram  . FEMOROPOPLITEAL THROMBECTOMY / EMBOLECTOMY  05/09/2012  . IR FLUORO GUIDE CV LINE RIGHT  03/01/2019  . IR US GUIDE VASC ACCESS RIGHT  03/01/2019  . MULTIPLE TOOTH EXTRACTIONS    . MYOMECTOMY    . PR VEIN BYPASS GRAFT,AORTO-FEM-POP  05/27/11   Right SFA-Below knee Pop BP  . REVISION OF ARTERIOVENOUS GORETEX GRAFT Right 08/21/2019   Procedure: REVISION OF ARTERIOVENOUS FISTULA RIGHT ARM;  Surgeon: Rosetta Posner, MD;  Location: [facility] OR;  Service: Vascular;  Laterality: Right;  . TEE WITHOUT CARDIOVERSION N/A 01/03/2017   Procedure: TRANSESOPHAGEAL ECHOCARDIOGRAM (TEE);  Surgeon: Skeet Latch, MD;  Location: [place];  Service: Cardiovascular;  Laterality: N/A;  . THORACENTESIS  02/27/2019      . TONSILLECTOMY    . TUBAL LIGATION  ~ 1990     Current Meds  Medication Sig  . acetaminophen (TYLENOL) 500 MG tablet Take 500 mg by mouth every 6 (six) hours as needed for mild  pain.  Marland Kitchen ALPRAZolam (XANAX) 0.25 MG tablet Take 1 tablet (0.25 mg total) by mouth 3 (three) times daily as needed for anxiety. (Patient taking differently: Take 0.25 mg by mouth daily as needed for anxiety. )  . aspirin 81 MG chewable tablet Chew 81 mg by mouth daily.  Marland Kitchen atorvastatin (LIPITOR) 80 MG tablet Take 80 mg by mouth daily.   . carvedilol (COREG) 12.5 MG tablet Take 1.5 tablets (18.75 mg total) by mouth 2 (two) times daily with a meal. One and a half tablets twice daily.  . cholecalciferol (VITAMIN D) 25 MCG (1000 UT) tablet Take 1,000 Units by mouth daily.  . insulin glargine (LANTUS) 100 UNIT/ML injection Inject 0.2 mLs (20 Units total) into the skin at bedtime.  . multivitamin (RENA-VIT) TABS tablet Take  1 tablet by mouth at bedtime.  . nitroGLYCERIN (NITROSTAT) 0.4 MG SL tablet Place 1 tablet (0.4 mg total) under the tongue every 5 (five) minutes x 3 doses as needed for chest pain.  Marland Kitchen RENVELA 800 MG tablet Take 800 mg by mouth See admin instructions. Take 1 tablet (800 mg) by mouth with each meal up to 5 times daily  . sodium bicarbonate 650 MG tablet Take 650 mg by mouth 2 (two) times daily.   . traMADol (ULTRAM) 50 MG tablet Take 1 tablet (50 mg total) by mouth every 6 (six) hours as needed.     Allergies:   Plavix [clopidogrel bisulfate], Codeine, and Tylenol with codeine #3 [acetaminophen-codeine]   Social History   Tobacco Use  . Smoking status: Former Smoker    Packs/day: 0.25    Years: 40.00    Pack years: 10.00    Types: Cigarettes    Quit date: 02/27/2012    Years since quitting: 7.7  . Smokeless tobacco: Never Used  Substance Use Topics  . Alcohol use: No  . Drug use: No     Family Hx: The patient's family history includes Cancer in her brother and mother; Diabetes in her father; Heart attack in her maternal grandfather and maternal grandmother.  ROS:   Please see the history of present illness.    All other systems reviewed and are negative.   Prior CV  studies:   The following studies were reviewed today: Cath 2016 Echo 2019  Labs/Other Tests and Data Reviewed:    EKG:  An ECG dated 02/26/2019 was personally reviewed today and demonstrated:  NSR, ST-107, PVCs, NSST changes  Recent Labs: 02/26/2019: ALT 20; B Natriuretic Peptide 1,190.5 03/06/2019: Platelets 133 08/21/2019: BUN 34; Creatinine, Ser 4.00; Hemoglobin 12.6; Potassium 4.6; Sodium 140   Recent Lipid Panel Lab Results  Component Value Date/Time   CHOL 162 09/15/2018 05:22 AM   TRIG 51 09/15/2018 05:22 AM   HDL 45 09/15/2018 05:22 AM   CHOLHDL 3.6 09/15/2018 05:22 AM   LDLCALC 107 (H) 09/15/2018 05:22 AM    Wt Readings from Last 3 Encounters:  10/01/19 152 lb (68.9 kg)  08/21/19 154 lb (69.9 kg)  07/30/19 152 lb (68.9 kg)     Objective:    Vital Signs:  Ht 5\' 4"  (1.626 m)   BMI 26.09 kg/m    VITAL SIGNS:  reviewed  ASSESSMENT & PLAN:    1.  CAD: Not a candidate for CABG, multiple non-ST elevation MI.  Treated medically and currently doing well.  2.  Chronic mixed CHF: She has no complaints of fluid retention, PND, or dyspnea.  Volume status managed through dialysis and nephrology.  3.  End-stage renal disease: On hemodialysis Monday Wednesdays and Fridays followed by nephrology.  Volume status is managed through dialysis.  4.  Insulin-dependent diabetes: Followed by primary care, follow-up labs are completed through their office.  5.  PAD: Status post right AKA 2013.  Being followed by Dr. [name]  6.  Hyperlipidemia: Continues on atorvastatin 80 mg daily with LDL goal of less than 70.    Plan: No change in Rx- in office f/u in 6 months.     COVID-19 Education: The signs and symptoms of COVID-19 were discussed with the patient and how to seek care for testing (follow up with PCP or arrange E-visit).  The importance of social distancing was discussed today.  Time:   Today, I have spent 10 minutes with the patient with telehealth technology  discussing the above problems.     Medication Adjustments/Labs and Tests Ordered: Current medicines are reviewed at length with the patient today.  Concerns regarding medicines are outlined above.   Tests Ordered: No orders of the defined types were placed in this encounter.   Medication Changes: No orders of the defined types were placed in this encounter.   Follow Up:  In Person 6 Months with Dr Oval Linsey  Signed, Kerin Ransom, PA-C  11/14/2019 10:31 AM    [place]

## 2019-11-14 NOTE — Patient Instructions (Signed)
Medication Instructions:  Your physician recommends that you continue on your current medications as directed. Please refer to the Current Medication list given to you today.  *If you need a refill on your cardiac medications before your next appointment, please call your pharmacy*   Follow-Up: At [facility], you and your health needs are our priority.  As part of our continuing mission to provide you with exceptional heart care, we have created designated Provider Care Teams.  These Care Teams include your primary Cardiologist (physician) and Advanced Practice Providers (APPs -  Physician Assistants and Nurse Practitioners) who all work together to provide you with the care you need, when you need it.  Your next appointment:   6 month(s)  The format for your next appointment:   In Person  Provider:   Skeet Latch, MD  Other Instructions Please call our office 2 months in advance to schedule your 6 month follow-up appointment.

## 2019-11-15 DIAGNOSIS — D509 Iron deficiency anemia, unspecified: Secondary | ICD-10-CM | POA: Diagnosis not present

## 2019-11-15 DIAGNOSIS — E1121 Type 2 diabetes mellitus with diabetic nephropathy: Secondary | ICD-10-CM | POA: Diagnosis not present

## 2019-11-15 DIAGNOSIS — N2581 Secondary hyperparathyroidism of renal origin: Secondary | ICD-10-CM | POA: Diagnosis not present

## 2019-11-15 DIAGNOSIS — N186 End stage renal disease: Secondary | ICD-10-CM | POA: Diagnosis not present

## 2019-11-15 DIAGNOSIS — D631 Anemia in chronic kidney disease: Secondary | ICD-10-CM | POA: Diagnosis not present

## 2019-11-15 DIAGNOSIS — Z23 Encounter for immunization: Secondary | ICD-10-CM | POA: Diagnosis not present

## 2019-11-17 DIAGNOSIS — N186 End stage renal disease: Secondary | ICD-10-CM | POA: Diagnosis not present

## 2019-11-17 DIAGNOSIS — Z992 Dependence on renal dialysis: Secondary | ICD-10-CM | POA: Diagnosis not present

## 2019-11-18 DIAGNOSIS — Z23 Encounter for immunization: Secondary | ICD-10-CM | POA: Diagnosis not present

## 2019-11-18 DIAGNOSIS — N186 End stage renal disease: Secondary | ICD-10-CM | POA: Diagnosis not present

## 2019-11-18 DIAGNOSIS — E1121 Type 2 diabetes mellitus with diabetic nephropathy: Secondary | ICD-10-CM | POA: Diagnosis not present

## 2019-11-18 DIAGNOSIS — D631 Anemia in chronic kidney disease: Secondary | ICD-10-CM | POA: Diagnosis not present

## 2019-11-18 DIAGNOSIS — N2581 Secondary hyperparathyroidism of renal origin: Secondary | ICD-10-CM | POA: Diagnosis not present

## 2019-11-20 DIAGNOSIS — N186 End stage renal disease: Secondary | ICD-10-CM | POA: Diagnosis not present

## 2019-11-20 DIAGNOSIS — N2581 Secondary hyperparathyroidism of renal origin: Secondary | ICD-10-CM | POA: Diagnosis not present

## 2019-11-20 DIAGNOSIS — E1121 Type 2 diabetes mellitus with diabetic nephropathy: Secondary | ICD-10-CM | POA: Diagnosis not present

## 2019-11-20 DIAGNOSIS — Z23 Encounter for immunization: Secondary | ICD-10-CM | POA: Diagnosis not present

## 2019-11-20 DIAGNOSIS — D631 Anemia in chronic kidney disease: Secondary | ICD-10-CM | POA: Diagnosis not present

## 2019-11-21 DIAGNOSIS — Z4901 Encounter for fitting and adjustment of extracorporeal dialysis catheter: Secondary | ICD-10-CM | POA: Diagnosis not present

## 2019-11-21 DIAGNOSIS — N186 End stage renal disease: Secondary | ICD-10-CM | POA: Diagnosis not present

## 2019-11-22 DIAGNOSIS — D631 Anemia in chronic kidney disease: Secondary | ICD-10-CM | POA: Diagnosis not present

## 2019-11-22 DIAGNOSIS — N186 End stage renal disease: Secondary | ICD-10-CM | POA: Diagnosis not present

## 2019-11-22 DIAGNOSIS — E1121 Type 2 diabetes mellitus with diabetic nephropathy: Secondary | ICD-10-CM | POA: Diagnosis not present

## 2019-11-22 DIAGNOSIS — Z23 Encounter for immunization: Secondary | ICD-10-CM | POA: Diagnosis not present

## 2019-11-22 DIAGNOSIS — N2581 Secondary hyperparathyroidism of renal origin: Secondary | ICD-10-CM | POA: Diagnosis not present

## 2019-11-25 DIAGNOSIS — N2581 Secondary hyperparathyroidism of renal origin: Secondary | ICD-10-CM | POA: Diagnosis not present

## 2019-11-25 DIAGNOSIS — Z23 Encounter for immunization: Secondary | ICD-10-CM | POA: Diagnosis not present

## 2019-11-25 DIAGNOSIS — E1121 Type 2 diabetes mellitus with diabetic nephropathy: Secondary | ICD-10-CM | POA: Diagnosis not present

## 2019-11-25 DIAGNOSIS — N186 End stage renal disease: Secondary | ICD-10-CM | POA: Diagnosis not present

## 2019-11-25 DIAGNOSIS — D631 Anemia in chronic kidney disease: Secondary | ICD-10-CM | POA: Diagnosis not present

## 2019-11-27 DIAGNOSIS — Z23 Encounter for immunization: Secondary | ICD-10-CM | POA: Diagnosis not present

## 2019-11-27 DIAGNOSIS — N2581 Secondary hyperparathyroidism of renal origin: Secondary | ICD-10-CM | POA: Diagnosis not present

## 2019-11-27 DIAGNOSIS — D631 Anemia in chronic kidney disease: Secondary | ICD-10-CM | POA: Diagnosis not present

## 2019-11-27 DIAGNOSIS — N186 End stage renal disease: Secondary | ICD-10-CM | POA: Diagnosis not present

## 2019-11-27 DIAGNOSIS — E1121 Type 2 diabetes mellitus with diabetic nephropathy: Secondary | ICD-10-CM | POA: Diagnosis not present

## 2019-11-29 DIAGNOSIS — D631 Anemia in chronic kidney disease: Secondary | ICD-10-CM | POA: Diagnosis not present

## 2019-11-29 DIAGNOSIS — Z23 Encounter for immunization: Secondary | ICD-10-CM | POA: Diagnosis not present

## 2019-11-29 DIAGNOSIS — N186 End stage renal disease: Secondary | ICD-10-CM | POA: Diagnosis not present

## 2019-11-29 DIAGNOSIS — N2581 Secondary hyperparathyroidism of renal origin: Secondary | ICD-10-CM | POA: Diagnosis not present

## 2019-11-29 DIAGNOSIS — E1121 Type 2 diabetes mellitus with diabetic nephropathy: Secondary | ICD-10-CM | POA: Diagnosis not present

## 2019-12-02 DIAGNOSIS — N186 End stage renal disease: Secondary | ICD-10-CM | POA: Diagnosis not present

## 2019-12-02 DIAGNOSIS — D631 Anemia in chronic kidney disease: Secondary | ICD-10-CM | POA: Diagnosis not present

## 2019-12-02 DIAGNOSIS — Z23 Encounter for immunization: Secondary | ICD-10-CM | POA: Diagnosis not present

## 2019-12-02 DIAGNOSIS — N2581 Secondary hyperparathyroidism of renal origin: Secondary | ICD-10-CM | POA: Diagnosis not present

## 2019-12-02 DIAGNOSIS — E1121 Type 2 diabetes mellitus with diabetic nephropathy: Secondary | ICD-10-CM | POA: Diagnosis not present

## 2019-12-04 DIAGNOSIS — E1121 Type 2 diabetes mellitus with diabetic nephropathy: Secondary | ICD-10-CM | POA: Diagnosis not present

## 2019-12-04 DIAGNOSIS — N186 End stage renal disease: Secondary | ICD-10-CM | POA: Diagnosis not present

## 2019-12-04 DIAGNOSIS — N2581 Secondary hyperparathyroidism of renal origin: Secondary | ICD-10-CM | POA: Diagnosis not present

## 2019-12-04 DIAGNOSIS — Z23 Encounter for immunization: Secondary | ICD-10-CM | POA: Diagnosis not present

## 2019-12-04 DIAGNOSIS — D631 Anemia in chronic kidney disease: Secondary | ICD-10-CM | POA: Diagnosis not present

## 2019-12-06 DIAGNOSIS — E1121 Type 2 diabetes mellitus with diabetic nephropathy: Secondary | ICD-10-CM | POA: Diagnosis not present

## 2019-12-06 DIAGNOSIS — D631 Anemia in chronic kidney disease: Secondary | ICD-10-CM | POA: Diagnosis not present

## 2019-12-06 DIAGNOSIS — N2581 Secondary hyperparathyroidism of renal origin: Secondary | ICD-10-CM | POA: Diagnosis not present

## 2019-12-06 DIAGNOSIS — Z23 Encounter for immunization: Secondary | ICD-10-CM | POA: Diagnosis not present

## 2019-12-06 DIAGNOSIS — N186 End stage renal disease: Secondary | ICD-10-CM | POA: Diagnosis not present

## 2019-12-09 DIAGNOSIS — Z23 Encounter for immunization: Secondary | ICD-10-CM | POA: Diagnosis not present

## 2019-12-09 DIAGNOSIS — N2581 Secondary hyperparathyroidism of renal origin: Secondary | ICD-10-CM | POA: Diagnosis not present

## 2019-12-09 DIAGNOSIS — E1121 Type 2 diabetes mellitus with diabetic nephropathy: Secondary | ICD-10-CM | POA: Diagnosis not present

## 2019-12-09 DIAGNOSIS — N186 End stage renal disease: Secondary | ICD-10-CM | POA: Diagnosis not present

## 2019-12-09 DIAGNOSIS — D631 Anemia in chronic kidney disease: Secondary | ICD-10-CM | POA: Diagnosis not present

## 2019-12-11 DIAGNOSIS — N2581 Secondary hyperparathyroidism of renal origin: Secondary | ICD-10-CM | POA: Diagnosis not present

## 2019-12-11 DIAGNOSIS — E1121 Type 2 diabetes mellitus with diabetic nephropathy: Secondary | ICD-10-CM | POA: Diagnosis not present

## 2019-12-11 DIAGNOSIS — N186 End stage renal disease: Secondary | ICD-10-CM | POA: Diagnosis not present

## 2019-12-11 DIAGNOSIS — D631 Anemia in chronic kidney disease: Secondary | ICD-10-CM | POA: Diagnosis not present

## 2019-12-11 DIAGNOSIS — Z23 Encounter for immunization: Secondary | ICD-10-CM | POA: Diagnosis not present

## 2019-12-13 DIAGNOSIS — Z23 Encounter for immunization: Secondary | ICD-10-CM | POA: Diagnosis not present

## 2019-12-13 DIAGNOSIS — D631 Anemia in chronic kidney disease: Secondary | ICD-10-CM | POA: Diagnosis not present

## 2019-12-13 DIAGNOSIS — N2581 Secondary hyperparathyroidism of renal origin: Secondary | ICD-10-CM | POA: Diagnosis not present

## 2019-12-13 DIAGNOSIS — E1121 Type 2 diabetes mellitus with diabetic nephropathy: Secondary | ICD-10-CM | POA: Diagnosis not present

## 2019-12-13 DIAGNOSIS — N186 End stage renal disease: Secondary | ICD-10-CM | POA: Diagnosis not present

## 2019-12-16 DIAGNOSIS — E1121 Type 2 diabetes mellitus with diabetic nephropathy: Secondary | ICD-10-CM | POA: Diagnosis not present

## 2019-12-16 DIAGNOSIS — N2581 Secondary hyperparathyroidism of renal origin: Secondary | ICD-10-CM | POA: Diagnosis not present

## 2019-12-16 DIAGNOSIS — N186 End stage renal disease: Secondary | ICD-10-CM | POA: Diagnosis not present

## 2019-12-16 DIAGNOSIS — Z23 Encounter for immunization: Secondary | ICD-10-CM | POA: Diagnosis not present

## 2019-12-16 DIAGNOSIS — D631 Anemia in chronic kidney disease: Secondary | ICD-10-CM | POA: Diagnosis not present

## 2019-12-18 DIAGNOSIS — Z23 Encounter for immunization: Secondary | ICD-10-CM | POA: Diagnosis not present

## 2019-12-18 DIAGNOSIS — N2581 Secondary hyperparathyroidism of renal origin: Secondary | ICD-10-CM | POA: Diagnosis not present

## 2019-12-18 DIAGNOSIS — Z992 Dependence on renal dialysis: Secondary | ICD-10-CM | POA: Diagnosis not present

## 2019-12-18 DIAGNOSIS — N186 End stage renal disease: Secondary | ICD-10-CM | POA: Diagnosis not present

## 2019-12-18 DIAGNOSIS — D631 Anemia in chronic kidney disease: Secondary | ICD-10-CM | POA: Diagnosis not present

## 2019-12-18 DIAGNOSIS — E1121 Type 2 diabetes mellitus with diabetic nephropathy: Secondary | ICD-10-CM | POA: Diagnosis not present

## 2019-12-19 DIAGNOSIS — N184 Chronic kidney disease, stage 4 (severe): Secondary | ICD-10-CM | POA: Diagnosis not present

## 2019-12-19 DIAGNOSIS — E119 Type 2 diabetes mellitus without complications: Secondary | ICD-10-CM | POA: Diagnosis not present

## 2019-12-19 DIAGNOSIS — E538 Deficiency of other specified B group vitamins: Secondary | ICD-10-CM | POA: Diagnosis not present

## 2019-12-19 DIAGNOSIS — D638 Anemia in other chronic diseases classified elsewhere: Secondary | ICD-10-CM | POA: Diagnosis not present

## 2019-12-19 DIAGNOSIS — I10 Essential (primary) hypertension: Secondary | ICD-10-CM | POA: Diagnosis not present

## 2019-12-19 DIAGNOSIS — E559 Vitamin D deficiency, unspecified: Secondary | ICD-10-CM | POA: Diagnosis not present

## 2019-12-19 DIAGNOSIS — I119 Hypertensive heart disease without heart failure: Secondary | ICD-10-CM | POA: Diagnosis not present

## 2019-12-19 DIAGNOSIS — Z89611 Acquired absence of right leg above knee: Secondary | ICD-10-CM | POA: Diagnosis not present

## 2019-12-20 DIAGNOSIS — E785 Hyperlipidemia, unspecified: Secondary | ICD-10-CM | POA: Diagnosis not present

## 2019-12-20 DIAGNOSIS — N2581 Secondary hyperparathyroidism of renal origin: Secondary | ICD-10-CM | POA: Diagnosis not present

## 2019-12-20 DIAGNOSIS — N186 End stage renal disease: Secondary | ICD-10-CM | POA: Diagnosis not present

## 2019-12-20 DIAGNOSIS — D631 Anemia in chronic kidney disease: Secondary | ICD-10-CM | POA: Diagnosis not present

## 2019-12-20 DIAGNOSIS — D509 Iron deficiency anemia, unspecified: Secondary | ICD-10-CM | POA: Diagnosis not present

## 2019-12-20 DIAGNOSIS — E1121 Type 2 diabetes mellitus with diabetic nephropathy: Secondary | ICD-10-CM | POA: Diagnosis not present

## 2019-12-23 DIAGNOSIS — N2581 Secondary hyperparathyroidism of renal origin: Secondary | ICD-10-CM | POA: Diagnosis not present

## 2019-12-23 DIAGNOSIS — D631 Anemia in chronic kidney disease: Secondary | ICD-10-CM | POA: Diagnosis not present

## 2019-12-23 DIAGNOSIS — N186 End stage renal disease: Secondary | ICD-10-CM | POA: Diagnosis not present

## 2019-12-23 DIAGNOSIS — E785 Hyperlipidemia, unspecified: Secondary | ICD-10-CM | POA: Diagnosis not present

## 2019-12-23 DIAGNOSIS — E1121 Type 2 diabetes mellitus with diabetic nephropathy: Secondary | ICD-10-CM | POA: Diagnosis not present

## 2019-12-23 DIAGNOSIS — D509 Iron deficiency anemia, unspecified: Secondary | ICD-10-CM | POA: Diagnosis not present

## 2019-12-25 DIAGNOSIS — E785 Hyperlipidemia, unspecified: Secondary | ICD-10-CM | POA: Diagnosis not present

## 2019-12-25 DIAGNOSIS — D509 Iron deficiency anemia, unspecified: Secondary | ICD-10-CM | POA: Diagnosis not present

## 2019-12-25 DIAGNOSIS — E1121 Type 2 diabetes mellitus with diabetic nephropathy: Secondary | ICD-10-CM | POA: Diagnosis not present

## 2019-12-25 DIAGNOSIS — D631 Anemia in chronic kidney disease: Secondary | ICD-10-CM | POA: Diagnosis not present

## 2019-12-25 DIAGNOSIS — N2581 Secondary hyperparathyroidism of renal origin: Secondary | ICD-10-CM | POA: Diagnosis not present

## 2019-12-25 DIAGNOSIS — N186 End stage renal disease: Secondary | ICD-10-CM | POA: Diagnosis not present

## 2019-12-27 DIAGNOSIS — N2581 Secondary hyperparathyroidism of renal origin: Secondary | ICD-10-CM | POA: Diagnosis not present

## 2019-12-27 DIAGNOSIS — D509 Iron deficiency anemia, unspecified: Secondary | ICD-10-CM | POA: Diagnosis not present

## 2019-12-27 DIAGNOSIS — E1121 Type 2 diabetes mellitus with diabetic nephropathy: Secondary | ICD-10-CM | POA: Diagnosis not present

## 2019-12-27 DIAGNOSIS — D631 Anemia in chronic kidney disease: Secondary | ICD-10-CM | POA: Diagnosis not present

## 2019-12-27 DIAGNOSIS — N186 End stage renal disease: Secondary | ICD-10-CM | POA: Diagnosis not present

## 2019-12-27 DIAGNOSIS — E785 Hyperlipidemia, unspecified: Secondary | ICD-10-CM | POA: Diagnosis not present

## 2019-12-30 DIAGNOSIS — D631 Anemia in chronic kidney disease: Secondary | ICD-10-CM | POA: Diagnosis not present

## 2019-12-30 DIAGNOSIS — E785 Hyperlipidemia, unspecified: Secondary | ICD-10-CM | POA: Diagnosis not present

## 2019-12-30 DIAGNOSIS — E1121 Type 2 diabetes mellitus with diabetic nephropathy: Secondary | ICD-10-CM | POA: Diagnosis not present

## 2019-12-30 DIAGNOSIS — N2581 Secondary hyperparathyroidism of renal origin: Secondary | ICD-10-CM | POA: Diagnosis not present

## 2019-12-30 DIAGNOSIS — N186 End stage renal disease: Secondary | ICD-10-CM | POA: Diagnosis not present

## 2019-12-30 DIAGNOSIS — D509 Iron deficiency anemia, unspecified: Secondary | ICD-10-CM | POA: Diagnosis not present

## 2019-12-30 IMAGING — DX DG CHEST 2V
2 series · 2 of 2 positions shown · non-contrast
Comparison: 01/11/2018

CLINICAL DATA: Chest pain

EXAM:
CHEST - 2 VIEW

[w chest lat]
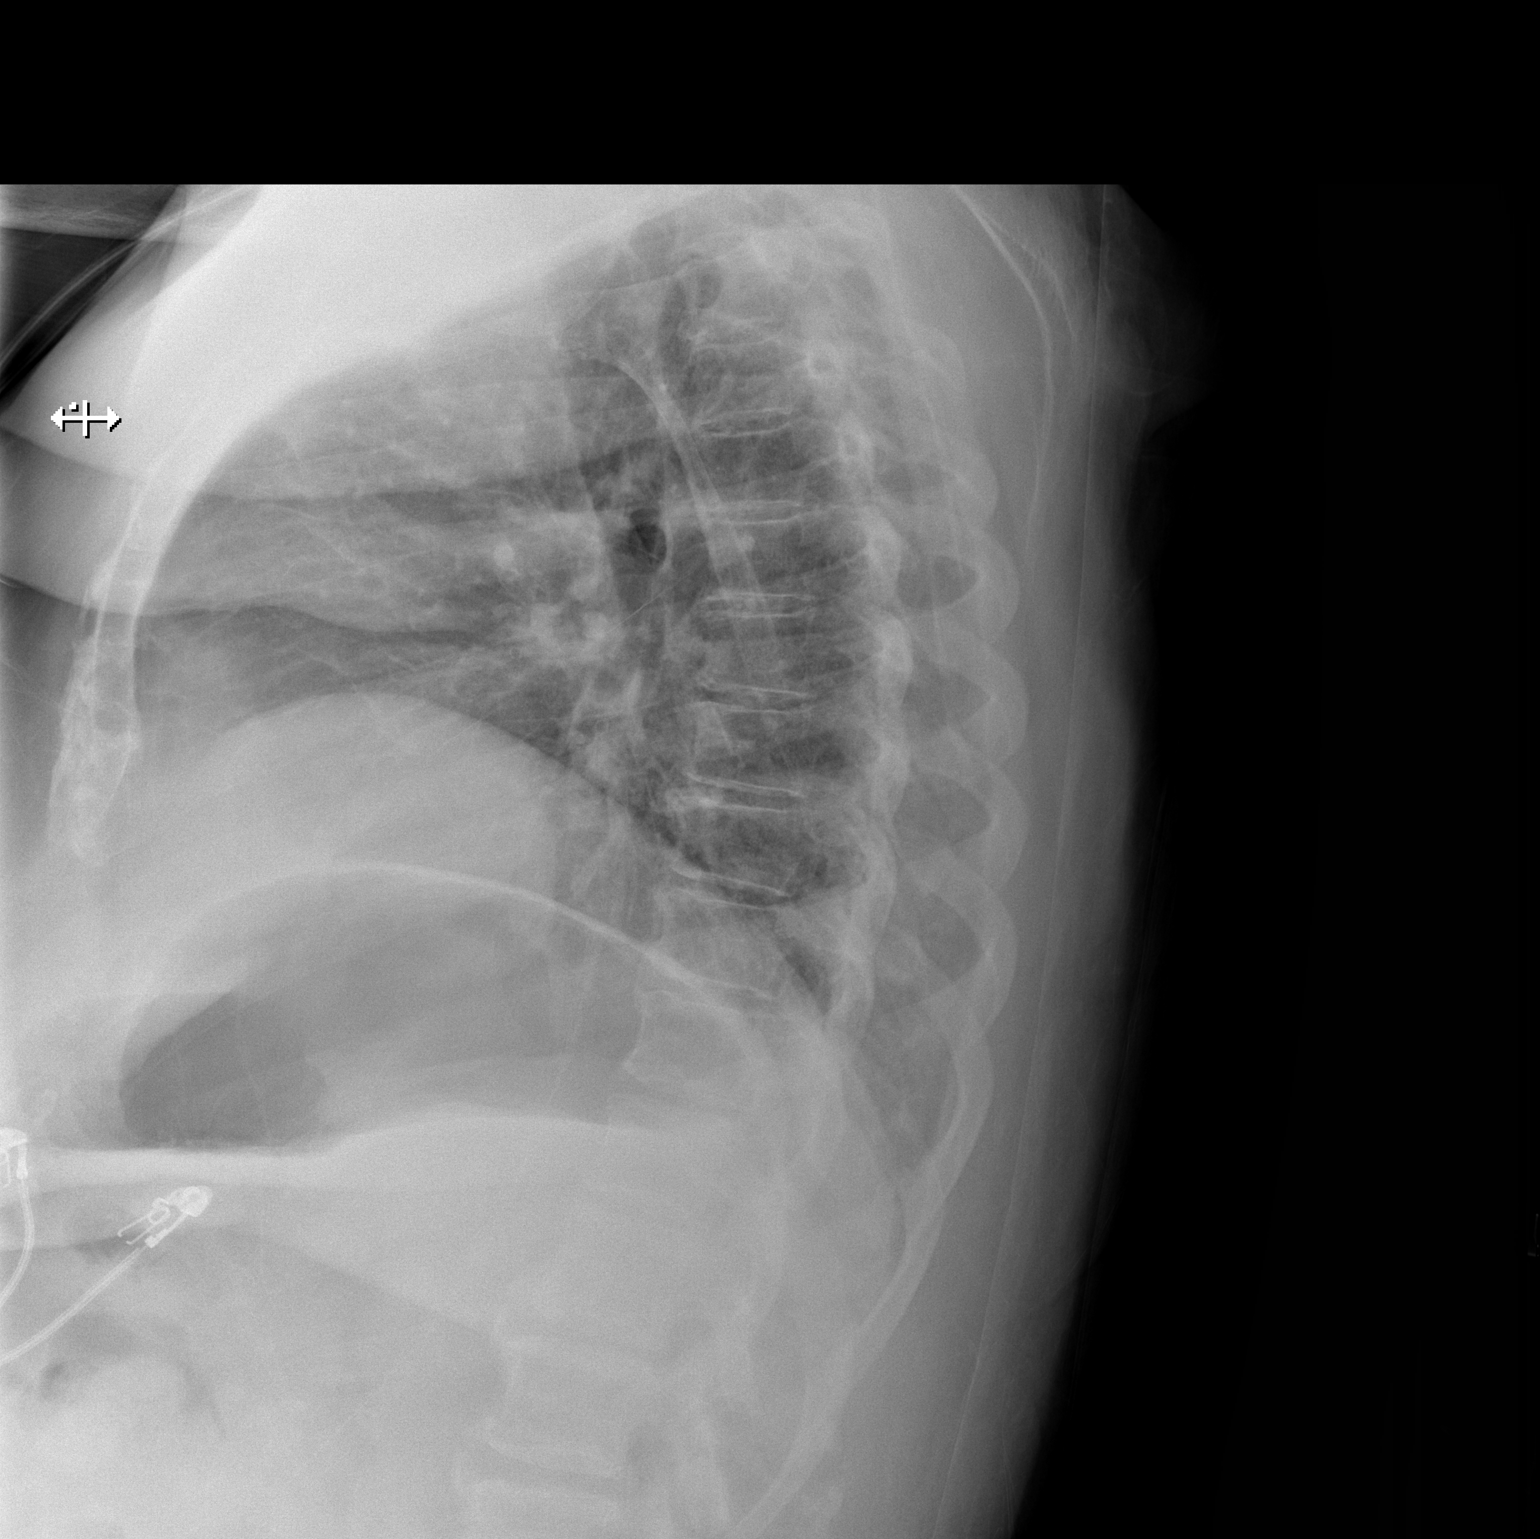

[x chest ap]
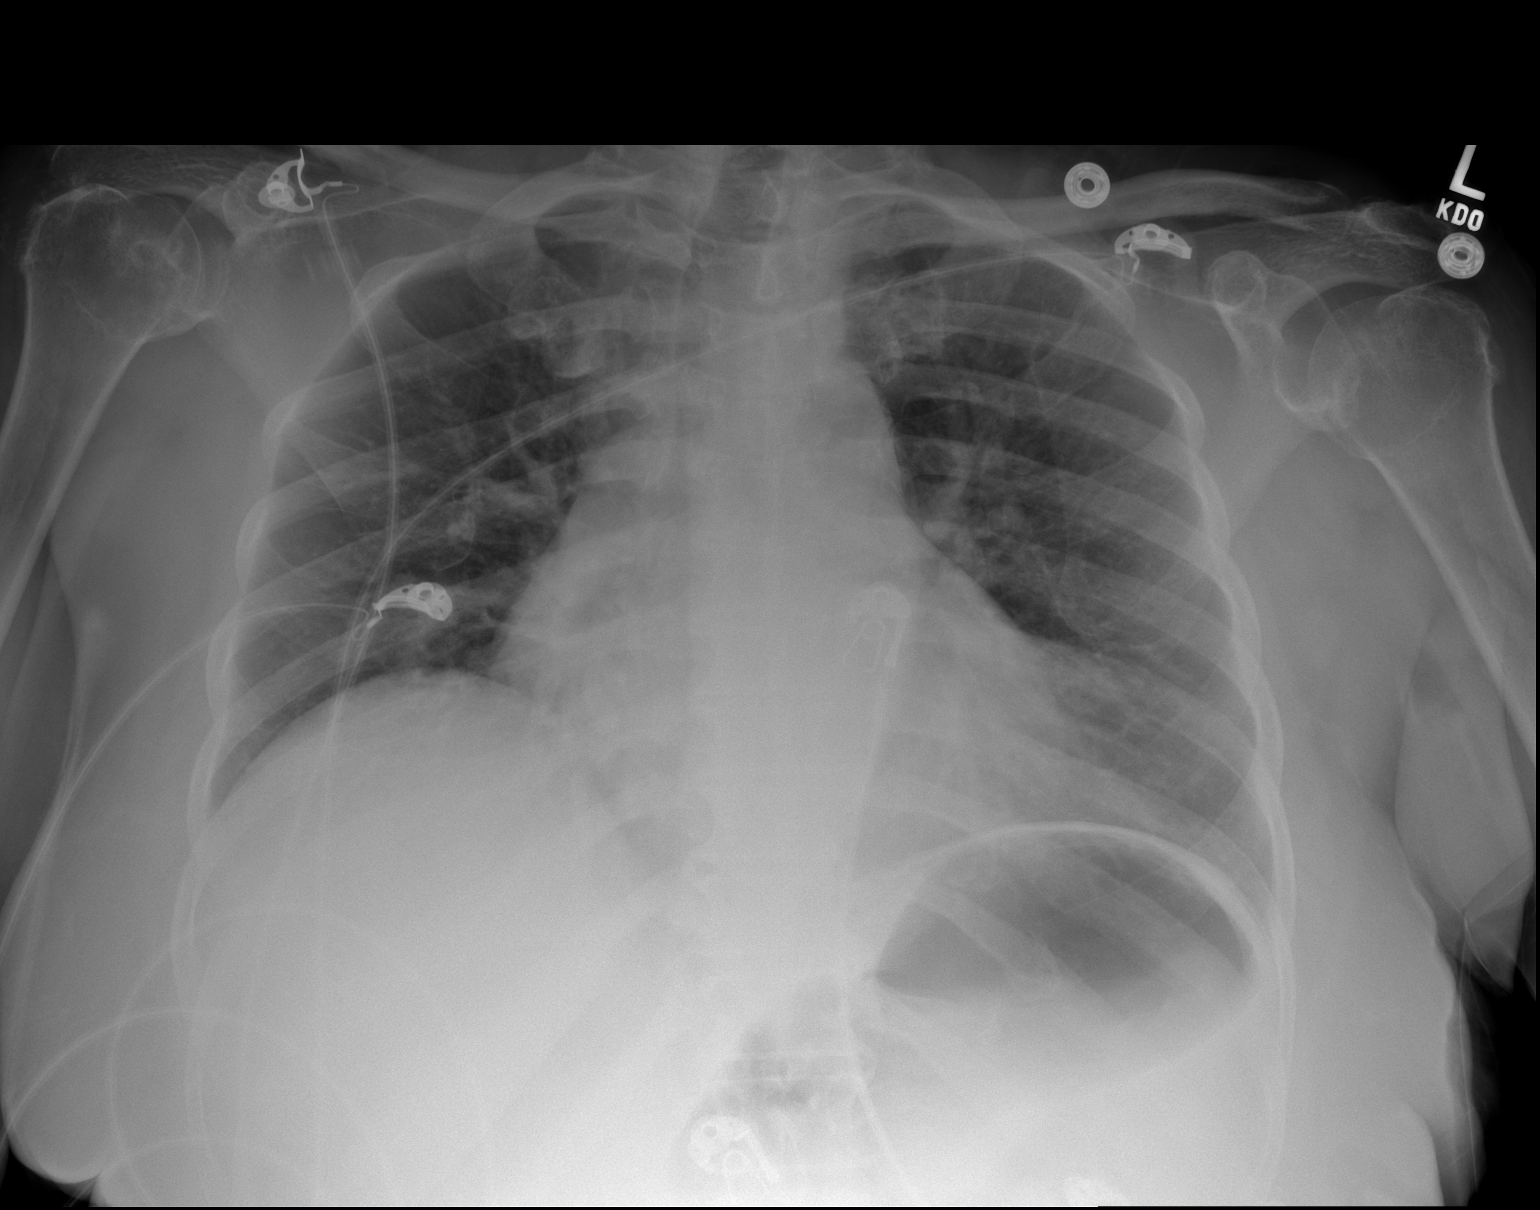

[2 of 2 positions shown; findings below may reference images not displayed]

FINDINGS: Mild cardiomegaly. No focal consolidation or effusion. No
pneumothorax. Degenerative changes of the spine.
IMPRESSION: No active cardiopulmonary disease.  Cardiomegaly.

## 2020-01-01 DIAGNOSIS — D631 Anemia in chronic kidney disease: Secondary | ICD-10-CM | POA: Diagnosis not present

## 2020-01-01 DIAGNOSIS — N2581 Secondary hyperparathyroidism of renal origin: Secondary | ICD-10-CM | POA: Diagnosis not present

## 2020-01-01 DIAGNOSIS — D509 Iron deficiency anemia, unspecified: Secondary | ICD-10-CM | POA: Diagnosis not present

## 2020-01-01 DIAGNOSIS — E785 Hyperlipidemia, unspecified: Secondary | ICD-10-CM | POA: Diagnosis not present

## 2020-01-01 DIAGNOSIS — E1121 Type 2 diabetes mellitus with diabetic nephropathy: Secondary | ICD-10-CM | POA: Diagnosis not present

## 2020-01-01 DIAGNOSIS — N186 End stage renal disease: Secondary | ICD-10-CM | POA: Diagnosis not present

## 2020-01-03 DIAGNOSIS — N2581 Secondary hyperparathyroidism of renal origin: Secondary | ICD-10-CM | POA: Diagnosis not present

## 2020-01-03 DIAGNOSIS — E785 Hyperlipidemia, unspecified: Secondary | ICD-10-CM | POA: Diagnosis not present

## 2020-01-03 DIAGNOSIS — D631 Anemia in chronic kidney disease: Secondary | ICD-10-CM | POA: Diagnosis not present

## 2020-01-03 DIAGNOSIS — E1121 Type 2 diabetes mellitus with diabetic nephropathy: Secondary | ICD-10-CM | POA: Diagnosis not present

## 2020-01-03 DIAGNOSIS — N186 End stage renal disease: Secondary | ICD-10-CM | POA: Diagnosis not present

## 2020-01-03 DIAGNOSIS — D509 Iron deficiency anemia, unspecified: Secondary | ICD-10-CM | POA: Diagnosis not present

## 2020-01-06 DIAGNOSIS — E785 Hyperlipidemia, unspecified: Secondary | ICD-10-CM | POA: Diagnosis not present

## 2020-01-06 DIAGNOSIS — D631 Anemia in chronic kidney disease: Secondary | ICD-10-CM | POA: Diagnosis not present

## 2020-01-06 DIAGNOSIS — N186 End stage renal disease: Secondary | ICD-10-CM | POA: Diagnosis not present

## 2020-01-06 DIAGNOSIS — E1121 Type 2 diabetes mellitus with diabetic nephropathy: Secondary | ICD-10-CM | POA: Diagnosis not present

## 2020-01-06 DIAGNOSIS — N2581 Secondary hyperparathyroidism of renal origin: Secondary | ICD-10-CM | POA: Diagnosis not present

## 2020-01-06 DIAGNOSIS — D509 Iron deficiency anemia, unspecified: Secondary | ICD-10-CM | POA: Diagnosis not present

## 2020-01-08 DIAGNOSIS — D509 Iron deficiency anemia, unspecified: Secondary | ICD-10-CM | POA: Diagnosis not present

## 2020-01-08 DIAGNOSIS — D631 Anemia in chronic kidney disease: Secondary | ICD-10-CM | POA: Diagnosis not present

## 2020-01-08 DIAGNOSIS — N2581 Secondary hyperparathyroidism of renal origin: Secondary | ICD-10-CM | POA: Diagnosis not present

## 2020-01-08 DIAGNOSIS — E1121 Type 2 diabetes mellitus with diabetic nephropathy: Secondary | ICD-10-CM | POA: Diagnosis not present

## 2020-01-08 DIAGNOSIS — N186 End stage renal disease: Secondary | ICD-10-CM | POA: Diagnosis not present

## 2020-01-08 DIAGNOSIS — E785 Hyperlipidemia, unspecified: Secondary | ICD-10-CM | POA: Diagnosis not present

## 2020-01-10 DIAGNOSIS — E785 Hyperlipidemia, unspecified: Secondary | ICD-10-CM | POA: Diagnosis not present

## 2020-01-10 DIAGNOSIS — D631 Anemia in chronic kidney disease: Secondary | ICD-10-CM | POA: Diagnosis not present

## 2020-01-10 DIAGNOSIS — E1121 Type 2 diabetes mellitus with diabetic nephropathy: Secondary | ICD-10-CM | POA: Diagnosis not present

## 2020-01-10 DIAGNOSIS — D509 Iron deficiency anemia, unspecified: Secondary | ICD-10-CM | POA: Diagnosis not present

## 2020-01-10 DIAGNOSIS — N186 End stage renal disease: Secondary | ICD-10-CM | POA: Diagnosis not present

## 2020-01-10 DIAGNOSIS — N2581 Secondary hyperparathyroidism of renal origin: Secondary | ICD-10-CM | POA: Diagnosis not present

## 2020-01-13 DIAGNOSIS — N2581 Secondary hyperparathyroidism of renal origin: Secondary | ICD-10-CM | POA: Diagnosis not present

## 2020-01-13 DIAGNOSIS — D509 Iron deficiency anemia, unspecified: Secondary | ICD-10-CM | POA: Diagnosis not present

## 2020-01-13 DIAGNOSIS — N186 End stage renal disease: Secondary | ICD-10-CM | POA: Diagnosis not present

## 2020-01-13 DIAGNOSIS — E1121 Type 2 diabetes mellitus with diabetic nephropathy: Secondary | ICD-10-CM | POA: Diagnosis not present

## 2020-01-13 DIAGNOSIS — E785 Hyperlipidemia, unspecified: Secondary | ICD-10-CM | POA: Diagnosis not present

## 2020-01-13 DIAGNOSIS — D631 Anemia in chronic kidney disease: Secondary | ICD-10-CM | POA: Diagnosis not present

## 2020-01-15 DIAGNOSIS — D631 Anemia in chronic kidney disease: Secondary | ICD-10-CM | POA: Diagnosis not present

## 2020-01-15 DIAGNOSIS — N2581 Secondary hyperparathyroidism of renal origin: Secondary | ICD-10-CM | POA: Diagnosis not present

## 2020-01-15 DIAGNOSIS — N186 End stage renal disease: Secondary | ICD-10-CM | POA: Diagnosis not present

## 2020-01-15 DIAGNOSIS — E785 Hyperlipidemia, unspecified: Secondary | ICD-10-CM | POA: Diagnosis not present

## 2020-01-15 DIAGNOSIS — D509 Iron deficiency anemia, unspecified: Secondary | ICD-10-CM | POA: Diagnosis not present

## 2020-01-15 DIAGNOSIS — E1121 Type 2 diabetes mellitus with diabetic nephropathy: Secondary | ICD-10-CM | POA: Diagnosis not present

## 2020-01-17 DIAGNOSIS — Z992 Dependence on renal dialysis: Secondary | ICD-10-CM | POA: Diagnosis not present

## 2020-01-17 DIAGNOSIS — D631 Anemia in chronic kidney disease: Secondary | ICD-10-CM | POA: Diagnosis not present

## 2020-01-17 DIAGNOSIS — N2581 Secondary hyperparathyroidism of renal origin: Secondary | ICD-10-CM | POA: Diagnosis not present

## 2020-01-17 DIAGNOSIS — E1121 Type 2 diabetes mellitus with diabetic nephropathy: Secondary | ICD-10-CM | POA: Diagnosis not present

## 2020-01-17 DIAGNOSIS — D509 Iron deficiency anemia, unspecified: Secondary | ICD-10-CM | POA: Diagnosis not present

## 2020-01-17 DIAGNOSIS — N186 End stage renal disease: Secondary | ICD-10-CM | POA: Diagnosis not present

## 2020-01-17 DIAGNOSIS — E785 Hyperlipidemia, unspecified: Secondary | ICD-10-CM | POA: Diagnosis not present

## 2020-01-20 DIAGNOSIS — D631 Anemia in chronic kidney disease: Secondary | ICD-10-CM | POA: Diagnosis not present

## 2020-01-20 DIAGNOSIS — E1121 Type 2 diabetes mellitus with diabetic nephropathy: Secondary | ICD-10-CM | POA: Diagnosis not present

## 2020-01-20 DIAGNOSIS — R7881 Bacteremia: Secondary | ICD-10-CM | POA: Diagnosis not present

## 2020-01-20 DIAGNOSIS — L089 Local infection of the skin and subcutaneous tissue, unspecified: Secondary | ICD-10-CM | POA: Diagnosis not present

## 2020-01-20 DIAGNOSIS — N186 End stage renal disease: Secondary | ICD-10-CM | POA: Diagnosis not present

## 2020-01-20 DIAGNOSIS — N2581 Secondary hyperparathyroidism of renal origin: Secondary | ICD-10-CM | POA: Diagnosis not present

## 2020-01-22 DIAGNOSIS — L089 Local infection of the skin and subcutaneous tissue, unspecified: Secondary | ICD-10-CM | POA: Diagnosis not present

## 2020-01-22 DIAGNOSIS — R7881 Bacteremia: Secondary | ICD-10-CM | POA: Diagnosis not present

## 2020-01-22 DIAGNOSIS — N186 End stage renal disease: Secondary | ICD-10-CM | POA: Diagnosis not present

## 2020-01-22 DIAGNOSIS — N2581 Secondary hyperparathyroidism of renal origin: Secondary | ICD-10-CM | POA: Diagnosis not present

## 2020-01-22 DIAGNOSIS — E1121 Type 2 diabetes mellitus with diabetic nephropathy: Secondary | ICD-10-CM | POA: Diagnosis not present

## 2020-01-22 DIAGNOSIS — D631 Anemia in chronic kidney disease: Secondary | ICD-10-CM | POA: Diagnosis not present

## 2020-01-24 DIAGNOSIS — N2581 Secondary hyperparathyroidism of renal origin: Secondary | ICD-10-CM | POA: Diagnosis not present

## 2020-01-24 DIAGNOSIS — N186 End stage renal disease: Secondary | ICD-10-CM | POA: Diagnosis not present

## 2020-01-24 DIAGNOSIS — D631 Anemia in chronic kidney disease: Secondary | ICD-10-CM | POA: Diagnosis not present

## 2020-01-24 DIAGNOSIS — L089 Local infection of the skin and subcutaneous tissue, unspecified: Secondary | ICD-10-CM | POA: Diagnosis not present

## 2020-01-24 DIAGNOSIS — R7881 Bacteremia: Secondary | ICD-10-CM | POA: Diagnosis not present

## 2020-01-24 DIAGNOSIS — E1121 Type 2 diabetes mellitus with diabetic nephropathy: Secondary | ICD-10-CM | POA: Diagnosis not present

## 2020-01-27 DIAGNOSIS — R7881 Bacteremia: Secondary | ICD-10-CM | POA: Diagnosis not present

## 2020-01-27 DIAGNOSIS — N186 End stage renal disease: Secondary | ICD-10-CM | POA: Diagnosis not present

## 2020-01-27 DIAGNOSIS — D631 Anemia in chronic kidney disease: Secondary | ICD-10-CM | POA: Diagnosis not present

## 2020-01-27 DIAGNOSIS — N2581 Secondary hyperparathyroidism of renal origin: Secondary | ICD-10-CM | POA: Diagnosis not present

## 2020-01-27 DIAGNOSIS — E1121 Type 2 diabetes mellitus with diabetic nephropathy: Secondary | ICD-10-CM | POA: Diagnosis not present

## 2020-01-27 DIAGNOSIS — L089 Local infection of the skin and subcutaneous tissue, unspecified: Secondary | ICD-10-CM | POA: Diagnosis not present

## 2020-01-29 DIAGNOSIS — L089 Local infection of the skin and subcutaneous tissue, unspecified: Secondary | ICD-10-CM | POA: Diagnosis not present

## 2020-01-29 DIAGNOSIS — E1121 Type 2 diabetes mellitus with diabetic nephropathy: Secondary | ICD-10-CM | POA: Diagnosis not present

## 2020-01-29 DIAGNOSIS — N2581 Secondary hyperparathyroidism of renal origin: Secondary | ICD-10-CM | POA: Diagnosis not present

## 2020-01-29 DIAGNOSIS — R7881 Bacteremia: Secondary | ICD-10-CM | POA: Diagnosis not present

## 2020-01-29 DIAGNOSIS — D631 Anemia in chronic kidney disease: Secondary | ICD-10-CM | POA: Diagnosis not present

## 2020-01-29 DIAGNOSIS — N186 End stage renal disease: Secondary | ICD-10-CM | POA: Diagnosis not present

## 2020-01-31 DIAGNOSIS — D631 Anemia in chronic kidney disease: Secondary | ICD-10-CM | POA: Diagnosis not present

## 2020-01-31 DIAGNOSIS — N186 End stage renal disease: Secondary | ICD-10-CM | POA: Diagnosis not present

## 2020-01-31 DIAGNOSIS — L089 Local infection of the skin and subcutaneous tissue, unspecified: Secondary | ICD-10-CM | POA: Diagnosis not present

## 2020-01-31 DIAGNOSIS — N2581 Secondary hyperparathyroidism of renal origin: Secondary | ICD-10-CM | POA: Diagnosis not present

## 2020-01-31 DIAGNOSIS — E1121 Type 2 diabetes mellitus with diabetic nephropathy: Secondary | ICD-10-CM | POA: Diagnosis not present

## 2020-01-31 DIAGNOSIS — R7881 Bacteremia: Secondary | ICD-10-CM | POA: Diagnosis not present

## 2020-02-03 DIAGNOSIS — R7881 Bacteremia: Secondary | ICD-10-CM | POA: Diagnosis not present

## 2020-02-03 DIAGNOSIS — D631 Anemia in chronic kidney disease: Secondary | ICD-10-CM | POA: Diagnosis not present

## 2020-02-03 DIAGNOSIS — N2581 Secondary hyperparathyroidism of renal origin: Secondary | ICD-10-CM | POA: Diagnosis not present

## 2020-02-03 DIAGNOSIS — N186 End stage renal disease: Secondary | ICD-10-CM | POA: Diagnosis not present

## 2020-02-03 DIAGNOSIS — L089 Local infection of the skin and subcutaneous tissue, unspecified: Secondary | ICD-10-CM | POA: Diagnosis not present

## 2020-02-03 DIAGNOSIS — E1121 Type 2 diabetes mellitus with diabetic nephropathy: Secondary | ICD-10-CM | POA: Diagnosis not present

## 2020-02-04 ENCOUNTER — Telehealth: Payer: Self-pay

## 2020-02-04 NOTE — Telephone Encounter (Signed)
Verbal permission given to speak with sister, Ramata Strothman. Patient's sister reports she noticed yesterday that patient had a sore weeping clear fluid from her left great toe, purplish discoloration and swelling to one-half of foot. Pt stated toe was injured from wheelchair running over toe. She denies numbness, tingling, cold/hot sensation or fever. She has pain that is improved with Tylenol and reports able to move toes but tight feeling.   Discussed with Corrina B., PA who advised to schedule patient for ABI, Lt LEA duplex with follow up. Scheduling will call patient with appointment information. Sister verbalized understanding. Minette Brine, RN

## 2020-02-05 DIAGNOSIS — R7881 Bacteremia: Secondary | ICD-10-CM | POA: Diagnosis not present

## 2020-02-05 DIAGNOSIS — E1121 Type 2 diabetes mellitus with diabetic nephropathy: Secondary | ICD-10-CM | POA: Diagnosis not present

## 2020-02-05 DIAGNOSIS — N186 End stage renal disease: Secondary | ICD-10-CM | POA: Diagnosis not present

## 2020-02-05 DIAGNOSIS — L089 Local infection of the skin and subcutaneous tissue, unspecified: Secondary | ICD-10-CM | POA: Diagnosis not present

## 2020-02-05 DIAGNOSIS — N2581 Secondary hyperparathyroidism of renal origin: Secondary | ICD-10-CM | POA: Diagnosis not present

## 2020-02-05 DIAGNOSIS — D631 Anemia in chronic kidney disease: Secondary | ICD-10-CM | POA: Diagnosis not present

## 2020-02-06 ENCOUNTER — Telehealth (HOSPITAL_COMMUNITY): Payer: Self-pay

## 2020-02-06 ENCOUNTER — Other Ambulatory Visit: Payer: Self-pay | Admitting: *Deleted

## 2020-02-06 DIAGNOSIS — I779 Disorder of arteries and arterioles, unspecified: Secondary | ICD-10-CM

## 2020-02-06 DIAGNOSIS — I739 Peripheral vascular disease, unspecified: Secondary | ICD-10-CM

## 2020-02-06 NOTE — Telephone Encounter (Signed)
The above patient or their representative was contacted and gave the following answers to these questions:         Do you have any of the following symptoms?    NO  Fever                    Cough                   Shortness of breath  Do  you have any of the following other symptoms?    muscle pain         vomiting,        diarrhea        rash         weakness        red eye        abdominal pain         bruising          bruising or bleeding              joint pain           severe headache    Have you been in contact with someone who was or has been sick in the past 2 weeks?  NO  Yes                 Unsure                         Unable to assess   Does the person that you were in contact with have any of the following symptoms?   Cough         shortness of breath           muscle pain         vomiting,            diarrhea            rash            weakness           fever            red eye           abdominal pain           bruising  or  bleeding                joint pain                severe headache                 COMMENTS OR ACTION PLAN FOR THIS PATIENT:        ALL QUESTIONS WERE ANSWERED BY PATIENT'S SISTER/[facility]

## 2020-02-07 ENCOUNTER — Ambulatory Visit (INDEPENDENT_AMBULATORY_CARE_PROVIDER_SITE_OTHER): Payer: Medicare Other | Admitting: Physician Assistant

## 2020-02-07 ENCOUNTER — Inpatient Hospital Stay (HOSPITAL_COMMUNITY): Admission: RE | Admit: 2020-02-07 | Payer: Medicare Other | Source: Ambulatory Visit

## 2020-02-07 ENCOUNTER — Ambulatory Visit: Payer: Medicare Other

## 2020-02-07 ENCOUNTER — Ambulatory Visit (INDEPENDENT_AMBULATORY_CARE_PROVIDER_SITE_OTHER)
Admission: RE | Admit: 2020-02-07 | Discharge: 2020-02-07 | Disposition: A | Discharge status: Home / Self Care | Payer: Medicare Other | Source: Ambulatory Visit | Attending: Vascular Surgery | Admitting: Vascular Surgery

## 2020-02-07 ENCOUNTER — Other Ambulatory Visit (HOSPITAL_COMMUNITY): Payer: Self-pay | Admitting: Vascular Surgery

## 2020-02-07 ENCOUNTER — Ambulatory Visit (HOSPITAL_COMMUNITY)
Admission: RE | Admit: 2020-02-07 | Discharge: 2020-02-07 | Disposition: A | Discharge status: Home / Self Care | Payer: Medicare Other | Source: Ambulatory Visit | Attending: Vascular Surgery | Admitting: Vascular Surgery

## 2020-02-07 ENCOUNTER — Encounter (HOSPITAL_COMMUNITY): Payer: Medicare Other

## 2020-02-07 ENCOUNTER — Other Ambulatory Visit: Payer: Self-pay

## 2020-02-07 VITALS — BP 163/78 | HR 80 | Temp 97.8°F | Resp 14 | Ht 64.0 in | Wt 152.0 lb

## 2020-02-07 DIAGNOSIS — I998 Other disorder of circulatory system: Secondary | ICD-10-CM | POA: Diagnosis not present

## 2020-02-07 DIAGNOSIS — I739 Peripheral vascular disease, unspecified: Secondary | ICD-10-CM

## 2020-02-07 DIAGNOSIS — Z992 Dependence on renal dialysis: Secondary | ICD-10-CM

## 2020-02-07 DIAGNOSIS — I70229 Atherosclerosis of native arteries of extremities with rest pain, unspecified extremity: Secondary | ICD-10-CM

## 2020-02-07 DIAGNOSIS — M79605 Pain in left leg: Secondary | ICD-10-CM

## 2020-02-07 DIAGNOSIS — I779 Disorder of arteries and arterioles, unspecified: Secondary | ICD-10-CM

## 2020-02-07 DIAGNOSIS — N186 End stage renal disease: Secondary | ICD-10-CM

## 2020-02-07 NOTE — H&P (View-Only) (Signed)
Established Critical Limb Ischemia Patient   History of Present Illness   Jenna Brown is a 69 y.o. ([birth year]) female who presents with a new left great toe wound as well as left foot redness.  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  The patient states 2 weeks ago she ran over her left foot with her wheelchair.  She has had rest pain at night on and off since that time.  Her niece is also present during today's visit who mentions that her foot is also turned red and she has had yellowish thick drainage from under great toe nail.  She is wheelchair-bound and keeps her left hip externally rotated and now has a pressure sore on her lateral, distal foot at 5th toe base.  She denies any pain in her foot during the day.  She denies any fevers, chills, nausea/vomiting.  She is on hemodialysis under the management of Dr. Joesph July of [facility].  In December 2020 she had a translocation of her right arm cephalic vein fistula by Dr. Donnetta Hutching which she states is working well for hemodialysis.  She is also an insulin dependent diabetic.  The patient's PMH, PSH, SH, and FamHx were reviewed and are unchanged from prior visit.  Current Outpatient Medications  Medication Sig Dispense Refill  . acetaminophen (TYLENOL) 500 MG tablet Take 500 mg by mouth every 6 (six) hours as needed for mild pain.    Marland Kitchen ALPRAZolam (XANAX) 0.25 MG tablet Take 1 tablet (0.25 mg total) by mouth 3 (three) times daily as needed for anxiety. (Patient taking differently: Take 0.25 mg by mouth daily as needed for anxiety. ) 12 tablet 0  . aspirin 81 MG chewable tablet Chew 81 mg by mouth daily.    Marland Kitchen atorvastatin (LIPITOR) 80 MG tablet Take 80 mg by mouth daily.     . carvedilol (COREG) 12.5 MG tablet Take 1.5 tablets (18.75 mg total) by mouth 2 (two) times daily with a meal. One and a half tablets twice daily. 270  tablet 3  . cholecalciferol (VITAMIN D) 25 MCG (1000 UT) tablet Take 1,000 Units by mouth daily.    . insulin glargine (LANTUS) 100 UNIT/ML injection Inject 0.2 mLs (20 Units total) into the skin at bedtime. 10 mL 11  . multivitamin (RENA-VIT) TABS tablet Take 1 tablet by mouth at bedtime. 30 tablet 2  . nitroGLYCERIN (NITROSTAT) 0.4 MG SL tablet Place 1 tablet (0.4 mg total) under the tongue every 5 (five) minutes x 3 doses as needed for chest pain. 25 tablet prn  . RENVELA 800 MG tablet Take 800 mg by mouth See admin instructions. Take 1 tablet (800 mg) by mouth with each meal up to 5 times daily    . sodium bicarbonate 650 MG tablet Take 650 mg by mouth 2 (two) times daily.     . traMADol (ULTRAM) 50 MG tablet Take 1 tablet (50 mg total) by mouth every 6 (six) hours as needed. 20 tablet 0   No current facility-administered medications for this visit.    REVIEW OF SYSTEMS (negative unless checked):   Cardiac:  []  Chest pain or chest pressure? []  Shortness of breath upon activity? []  Shortness of breath when lying flat? []  Irregular heart rhythm?  Vascular:  []  Pain in calf, thigh, or hip brought on by walking? []  Pain in feet at night that wakes you up from your sleep? []  Blood  clot in your veins? []  Leg swelling?  Pulmonary:  []  Oxygen at home? []  Productive cough? []  Wheezing?  Neurologic:  []  Sudden weakness in arms or legs? []  Sudden numbness in arms or legs? []  Sudden onset of difficult speaking or slurred speech? []  Temporary loss of vision in one eye? []  Problems with dizziness?  Gastrointestinal:  []  Blood in stool? []  Vomited blood?  Genitourinary:  []  Burning when urinating? []  Blood in urine?  Psychiatric:  []  Major depression  Hematologic:  []  Bleeding problems? []  Problems with blood clotting?  Dermatologic:  []  Rashes or ulcers?  Constitutional:  []  Fever or chills?  Ear/Nose/Throat:  []  Change in hearing? []  Nose bleeds? []  Sore throat?   Musculoskeletal:  []  Back pain? []  Joint pain? []  Muscle pain?   Physical Examination   Vitals:   02/07/20 1527  BP: (!) 163/78  Pulse: 80  Resp: 14  Temp: 97.8 F (36.6 C)  TempSrc: Temporal  SpO2: 100%  Weight: 152 lb (68.9 kg)  Height: 5\' 4"  (1.626 m)   Body mass index is 26.09 kg/m.  General:  WDWN in NAD; vital signs documented above Gait: Not observed HENT: WNL, normocephalic Pulmonary: normal non-labored breathing , without Rales, rhonchi,  wheezing Cardiac: regular HR Abdomen: soft, NT, no masses Skin: without rashes Vascular Exam/Pulses:  Right Left  Radial 2+ (normal) 2+ (normal)  Femoral 2+ (normal) 2+ (normal)  Popliteal AKA absent  DP AKA absent  PT AKA absent   Extremities: Pressure callus/ulcer distal lateral foot; distal foot and great toe redness; no tenderness to palpation; scant purulent discharge expressed with manipulation of great toe nail; foot is warm to touch with motor and sensation intact Musculoskeletal: no muscle wasting or atrophy  Neurologic: A&O X 3;  No focal weakness or paresthesias are detected Psychiatric:  The pt has Normal affect.  Non-Invasive Vascular Imaging    ABI noncompressible; TBI 0.17  Left lower extremity arterial duplex demonstrating distal SFA occlusion with reconstitution of popliteal artery; diffuse tibial disease   Medical Decision Making   Jenna Brown is a 69 y.o. female who presents with: LLE critical limb ischemia with question of left great toe infection   Imaging studies today demonstrate an occluded distal left SFA with popliteal reconstitution and diffuse tibial disease; incompressible tibial vessels so unable to obtain ABI  Distal foot and great toe redness with scant purulent drainage from under great toe nailbed concerning for deep infection; I contacted patient's [place] [facility] on [employer] and she will receive vancomycin during tomorrow's and Mondays dialysis treatments  Left  lower extremity arteriogram with possible intervention is scheduled with Dr. [name] on Tuesday 02/11/2020  I discussed with the patient and her niece the risks and benefits of angiography and they agreed to proceed   Dagoberto Ligas PA-C [facility] Office: [facility] MD: Donzetta Matters

## 2020-02-07 NOTE — Progress Notes (Signed)
Established Critical Limb Ischemia Patient   History of Present Illness   Jenna Brown is a 69 y.o. (May 29, 1951) female who presents with a new left great toe wound as well as left foot redness.  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  The patient states 2 weeks ago she ran over her left foot with her wheelchair.  She has had rest pain at night on and off since that time.  Her niece is also present during today's visit who mentions that her foot is also turned red and she has had yellowish thick drainage from under great toe nail.  She is wheelchair-bound and keeps her left hip externally rotated and now has a pressure sore on her lateral, distal foot at 5th toe base.  She denies any pain in her foot during the day.  She denies any fevers, chills, nausea/vomiting.  She is on hemodialysis under the management of Dr. Joesph July of [facility].  In December 2020 she had a translocation of her right arm cephalic vein fistula by Dr. Donnetta Hutching which she states is working well for hemodialysis.  She is also an insulin dependent diabetic.  The patient's PMH, PSH, SH, and FamHx were reviewed and are unchanged from prior visit.  Current Outpatient Medications  Medication Sig Dispense Refill  . acetaminophen (TYLENOL) 500 MG tablet Take 500 mg by mouth every 6 (six) hours as needed for mild pain.    Marland Kitchen ALPRAZolam (XANAX) 0.25 MG tablet Take 1 tablet (0.25 mg total) by mouth 3 (three) times daily as needed for anxiety. (Patient taking differently: Take 0.25 mg by mouth daily as needed for anxiety. ) 12 tablet 0  . aspirin 81 MG chewable tablet Chew 81 mg by mouth daily.    Marland Kitchen atorvastatin (LIPITOR) 80 MG tablet Take 80 mg by mouth daily.     . carvedilol (COREG) 12.5 MG tablet Take 1.5 tablets (18.75 mg total) by mouth 2 (two) times daily with a meal. One and a half tablets twice daily. 270  tablet 3  . cholecalciferol (VITAMIN D) 25 MCG (1000 UT) tablet Take 1,000 Units by mouth daily.    . insulin glargine (LANTUS) 100 UNIT/ML injection Inject 0.2 mLs (20 Units total) into the skin at bedtime. 10 mL 11  . multivitamin (RENA-VIT) TABS tablet Take 1 tablet by mouth at bedtime. 30 tablet 2  . nitroGLYCERIN (NITROSTAT) 0.4 MG SL tablet Place 1 tablet (0.4 mg total) under the tongue every 5 (five) minutes x 3 doses as needed for chest pain. 25 tablet prn  . RENVELA 800 MG tablet Take 800 mg by mouth See admin instructions. Take 1 tablet (800 mg) by mouth with each meal up to 5 times daily    . sodium bicarbonate 650 MG tablet Take 650 mg by mouth 2 (two) times daily.     . traMADol (ULTRAM) 50 MG tablet Take 1 tablet (50 mg total) by mouth every 6 (six) hours as needed. 20 tablet 0   No current facility-administered medications for this visit.    REVIEW OF SYSTEMS (negative unless checked):   Cardiac:  []  Chest pain or chest pressure? []  Shortness of breath upon activity? []  Shortness of breath when lying flat? []  Irregular heart rhythm?  Vascular:  []  Pain in calf, thigh, or hip brought on by walking? []  Pain in feet at night that wakes you up from your sleep? []  Blood  clot in your veins? []  Leg swelling?  Pulmonary:  []  Oxygen at home? []  Productive cough? []  Wheezing?  Neurologic:  []  Sudden weakness in arms or legs? []  Sudden numbness in arms or legs? []  Sudden onset of difficult speaking or slurred speech? []  Temporary loss of vision in one eye? []  Problems with dizziness?  Gastrointestinal:  []  Blood in stool? []  Vomited blood?  Genitourinary:  []  Burning when urinating? []  Blood in urine?  Psychiatric:  []  Major depression  Hematologic:  []  Bleeding problems? []  Problems with blood clotting?  Dermatologic:  []  Rashes or ulcers?  Constitutional:  []  Fever or chills?  Ear/Nose/Throat:  []  Change in hearing? []  Nose bleeds? []  Sore  throat?  Musculoskeletal:  []  Back pain? []  Joint pain? []  Muscle pain?   Physical Examination   Vitals:   02/07/20 1527  BP: (!) 163/78  Pulse: 80  Resp: 14  Temp: 97.8 F (36.6 C)  TempSrc: Temporal  SpO2: 100%  Weight: 152 lb (68.9 kg)  Height: 5\' 4"  (1.626 m)   Body mass index is 26.09 kg/m.  General:  WDWN in NAD; vital signs documented above Gait: Not observed HENT: WNL, normocephalic Pulmonary: normal non-labored breathing , without Rales, rhonchi,  wheezing Cardiac: regular HR Abdomen: soft, NT, no masses Skin: without rashes Vascular Exam/Pulses:  Right Left  Radial 2+ (normal) 2+ (normal)  Femoral 2+ (normal) 2+ (normal)  Popliteal AKA absent  DP AKA absent  PT AKA absent   Extremities: Pressure callus/ulcer distal lateral foot; distal foot and great toe redness; no tenderness to palpation; scant purulent discharge expressed with manipulation of great toe nail; foot is warm to touch with motor and sensation intact Musculoskeletal: no muscle wasting or atrophy  Neurologic: A&O X 3;  No focal weakness or paresthesias are detected Psychiatric:  The pt has Normal affect.  Non-Invasive Vascular Imaging    ABI noncompressible; TBI 0.17  Left lower extremity arterial duplex demonstrating distal SFA occlusion with reconstitution of popliteal artery; diffuse tibial disease   Medical Decision Making   Jenna Brown is a 69 y.o. female who presents with: LLE critical limb ischemia with question of left great toe infection   Imaging studies today demonstrate an occluded distal left SFA with popliteal reconstitution and diffuse tibial disease; incompressible tibial vessels so unable to obtain ABI  Distal foot and great toe redness with scant purulent drainage from under great toe nailbed concerning for deep infection; I contacted patient's [place] [facility] on [employer] and she will receive vancomycin during tomorrow's and Mondays dialysis  treatments  Left lower extremity arteriogram with possible intervention is scheduled with Dr. [name] on Tuesday 02/11/2020  I discussed with the patient and her niece the risks and benefits of angiography and they agreed to proceed   Dagoberto Ligas PA-C [facility] [place] Office: [facility] MD: Donzetta Matters

## 2020-02-08 DIAGNOSIS — L089 Local infection of the skin and subcutaneous tissue, unspecified: Secondary | ICD-10-CM | POA: Diagnosis not present

## 2020-02-08 DIAGNOSIS — E1121 Type 2 diabetes mellitus with diabetic nephropathy: Secondary | ICD-10-CM | POA: Diagnosis not present

## 2020-02-08 DIAGNOSIS — N2581 Secondary hyperparathyroidism of renal origin: Secondary | ICD-10-CM | POA: Diagnosis not present

## 2020-02-08 DIAGNOSIS — D631 Anemia in chronic kidney disease: Secondary | ICD-10-CM | POA: Diagnosis not present

## 2020-02-08 DIAGNOSIS — N186 End stage renal disease: Secondary | ICD-10-CM | POA: Diagnosis not present

## 2020-02-08 DIAGNOSIS — R7881 Bacteremia: Secondary | ICD-10-CM | POA: Diagnosis not present

## 2020-02-10 ENCOUNTER — Other Ambulatory Visit (HOSPITAL_COMMUNITY)
Admission: RE | Admit: 2020-02-10 | Discharge: 2020-02-10 | Disposition: A | Discharge status: Home / Self Care | Payer: Medicare Other | Source: Ambulatory Visit | Attending: Surgery | Admitting: Surgery

## 2020-02-10 ENCOUNTER — Other Ambulatory Visit: Payer: Self-pay

## 2020-02-10 DIAGNOSIS — N2581 Secondary hyperparathyroidism of renal origin: Secondary | ICD-10-CM | POA: Diagnosis not present

## 2020-02-10 DIAGNOSIS — Z01812 Encounter for preprocedural laboratory examination: Secondary | ICD-10-CM | POA: Insufficient documentation

## 2020-02-10 DIAGNOSIS — Z20822 Contact with and (suspected) exposure to covid-19: Secondary | ICD-10-CM | POA: Insufficient documentation

## 2020-02-10 DIAGNOSIS — D631 Anemia in chronic kidney disease: Secondary | ICD-10-CM | POA: Diagnosis not present

## 2020-02-10 DIAGNOSIS — E1121 Type 2 diabetes mellitus with diabetic nephropathy: Secondary | ICD-10-CM | POA: Diagnosis not present

## 2020-02-10 DIAGNOSIS — N186 End stage renal disease: Secondary | ICD-10-CM | POA: Diagnosis not present

## 2020-02-10 DIAGNOSIS — L089 Local infection of the skin and subcutaneous tissue, unspecified: Secondary | ICD-10-CM | POA: Diagnosis not present

## 2020-02-10 DIAGNOSIS — R7881 Bacteremia: Secondary | ICD-10-CM | POA: Diagnosis not present

## 2020-02-10 LAB — SARS CORONAVIRUS 2 (TAT 6-24 HRS): SARS Coronavirus 2: NEGATIVE

## 2020-02-11 ENCOUNTER — Ambulatory Visit (HOSPITAL_COMMUNITY)
Admission: RE | Admit: 2020-02-11 | Discharge: 2020-02-11 | Disposition: A | Discharge status: Home / Self Care | Payer: Medicare Other | Source: Ambulatory Visit | Attending: Surgery | Admitting: Surgery

## 2020-02-11 ENCOUNTER — Other Ambulatory Visit: Payer: Self-pay

## 2020-02-11 ENCOUNTER — Ambulatory Visit (HOSPITAL_COMMUNITY)
Admission: RE | Disposition: A | Discharge status: Home / Self Care | Payer: Self-pay | Source: Ambulatory Visit | Attending: Surgery

## 2020-02-11 DIAGNOSIS — Z992 Dependence on renal dialysis: Secondary | ICD-10-CM | POA: Insufficient documentation

## 2020-02-11 DIAGNOSIS — I70245 Atherosclerosis of native arteries of left leg with ulceration of other part of foot: Secondary | ICD-10-CM | POA: Insufficient documentation

## 2020-02-11 DIAGNOSIS — Z794 Long term (current) use of insulin: Secondary | ICD-10-CM | POA: Diagnosis not present

## 2020-02-11 DIAGNOSIS — Z7982 Long term (current) use of aspirin: Secondary | ICD-10-CM | POA: Diagnosis not present

## 2020-02-11 DIAGNOSIS — Z79899 Other long term (current) drug therapy: Secondary | ICD-10-CM | POA: Diagnosis not present

## 2020-02-11 DIAGNOSIS — E11621 Type 2 diabetes mellitus with foot ulcer: Secondary | ICD-10-CM | POA: Insufficient documentation

## 2020-02-11 DIAGNOSIS — Z993 Dependence on wheelchair: Secondary | ICD-10-CM | POA: Insufficient documentation

## 2020-02-11 DIAGNOSIS — L97529 Non-pressure chronic ulcer of other part of left foot with unspecified severity: Secondary | ICD-10-CM | POA: Diagnosis not present

## 2020-02-11 DIAGNOSIS — Z89611 Acquired absence of right leg above knee: Secondary | ICD-10-CM | POA: Diagnosis not present

## 2020-02-11 HISTORY — PX: ABDOMINAL AORTOGRAM W/LOWER EXTREMITY: CATH118223

## 2020-02-11 LAB — GLUCOSE, CAPILLARY
Glucose-Capillary: 71 mg/dL (ref 70–99)
Glucose-Capillary: 55 mg/dL — ABNORMAL LOW (ref 70–99)
Glucose-Capillary: 99 mg/dL (ref 70–99)
Glucose-Capillary: 62 mg/dL — ABNORMAL LOW (ref 70–99)
Glucose-Capillary: 55 mg/dL — ABNORMAL LOW (ref 70–99)

## 2020-02-11 LAB — POCT ACTIVATED CLOTTING TIME
Activated Clotting Time: 279 seconds
Activated Clotting Time: 252 seconds
Activated Clotting Time: 202 seconds
Activated Clotting Time: 180 seconds

## 2020-02-11 LAB — POCT I-STAT, CHEM 8
BUN: 34 mg/dL — ABNORMAL HIGH (ref 8–23)
Calcium, Ion: 1.17 mmol/L (ref 1.15–1.40)
Chloride: 96 mmol/L — ABNORMAL LOW (ref 98–111)
Creatinine, Ser: 4.8 mg/dL — ABNORMAL HIGH (ref 0.44–1.00)
Glucose, Bld: 63 mg/dL — ABNORMAL LOW (ref 70–99)
HCT: 36 % (ref 36.0–46.0)
Hemoglobin: 12.2 g/dL (ref 12.0–15.0)
Potassium: 4.3 mmol/L (ref 3.5–5.1)
Sodium: 141 mmol/L (ref 135–145)
TCO2: 34 mmol/L — ABNORMAL HIGH (ref 22–32)

## 2020-02-11 SURGERY — ABDOMINAL AORTOGRAM W/LOWER EXTREMITY
Anesthesia: LOCAL

## 2020-02-11 MED ORDER — ONDANSETRON HCL 4 MG/2ML IJ SOLN: 4.0000 mg | Freq: Four times a day (QID) | INTRAMUSCULAR | Status: DC | PRN

## 2020-02-11 MED ORDER — HEPARIN (PORCINE) IN NACL 1000-0.9 UT/500ML-% IV SOLN
INTRAVENOUS | Status: DC | PRN
  Administered 2020-02-11 (×2): 500 mL

## 2020-02-11 MED ORDER — SODIUM CHLORIDE 0.9% FLUSH: 3.0000 mL | Freq: Two times a day (BID) | INTRAVENOUS | Status: DC

## 2020-02-11 MED ORDER — FENTANYL CITRATE (PF) 100 MCG/2ML IJ SOLN
INTRAMUSCULAR | Status: AC
  Filled 2020-02-11: qty 2

## 2020-02-11 MED ORDER — MIDAZOLAM HCL 2 MG/2ML IJ SOLN
INTRAMUSCULAR | Status: AC
  Filled 2020-02-11: qty 2

## 2020-02-11 MED ORDER — SODIUM CHLORIDE 0.9% FLUSH: 3.0000 mL | INTRAVENOUS | Status: DC | PRN

## 2020-02-11 MED ORDER — MORPHINE SULFATE (PF) 2 MG/ML IV SOLN: 2.0000 mg | INTRAVENOUS | Status: DC | PRN

## 2020-02-11 MED ORDER — HEPARIN (PORCINE) IN NACL 1000-0.9 UT/500ML-% IV SOLN
INTRAVENOUS | Status: AC
  Filled 2020-02-11: qty 1000

## 2020-02-11 MED ORDER — MIDAZOLAM HCL 2 MG/2ML IJ SOLN
INTRAMUSCULAR | Status: DC | PRN
  Administered 2020-02-11: 1 mg via INTRAVENOUS
  Administered 2020-02-11: 2 mg via INTRAVENOUS

## 2020-02-11 MED ORDER — SODIUM CHLORIDE 0.9 % IV SOLN: 250.0000 mL | INTRAVENOUS | Status: DC | PRN

## 2020-02-11 MED ORDER — HYDRALAZINE HCL 20 MG/ML IJ SOLN: 5.0000 mg | INTRAMUSCULAR | Status: DC | PRN

## 2020-02-11 MED ORDER — DEXTROSE 50 % IV SOLN
12.5000 g | INTRAVENOUS | Status: AC
  Administered 2020-02-11: 12.5 g via INTRAVENOUS

## 2020-02-11 MED ORDER — HEPARIN SODIUM (PORCINE) 1000 UNIT/ML IJ SOLN
INTRAMUSCULAR | Status: AC
  Filled 2020-02-11: qty 1

## 2020-02-11 MED ORDER — HEPARIN SODIUM (PORCINE) 1000 UNIT/ML IJ SOLN
INTRAMUSCULAR | Status: DC | PRN
  Administered 2020-02-11: 7000 [IU] via INTRAVENOUS

## 2020-02-11 MED ORDER — ACETAMINOPHEN 325 MG PO TABS: 650.0000 mg | ORAL_TABLET | ORAL | Status: DC | PRN

## 2020-02-11 MED ORDER — FENTANYL CITRATE (PF) 100 MCG/2ML IJ SOLN
INTRAMUSCULAR | Status: DC | PRN
  Administered 2020-02-11: 25 ug via INTRAVENOUS
  Administered 2020-02-11: 50 ug via INTRAVENOUS

## 2020-02-11 MED ORDER — DEXTROSE 50 % IV SOLN
INTRAVENOUS | Status: AC
  Administered 2020-02-11: 12.5 g via INTRAVENOUS
  Filled 2020-02-11: qty 50

## 2020-02-11 MED ORDER — SODIUM CHLORIDE 0.9 % IV SOLN
250.0000 mL | INTRAVENOUS | Status: DC | PRN
  Administered 2020-02-11: 10 mL via INTRAVENOUS

## 2020-02-11 MED ORDER — LIDOCAINE HCL (PF) 1 % IJ SOLN
INTRAMUSCULAR | Status: AC
  Filled 2020-02-11: qty 30

## 2020-02-11 MED ORDER — DEXTROSE 50 % IV SOLN: 12.5000 g | INTRAVENOUS | Status: AC

## 2020-02-11 MED ORDER — LABETALOL HCL 5 MG/ML IV SOLN: 10.0000 mg | INTRAVENOUS | Status: DC | PRN

## 2020-02-11 MED ORDER — IODIXANOL 320 MG/ML IV SOLN
INTRAVENOUS | Status: DC | PRN
  Administered 2020-02-11: 80 mL via INTRA_ARTERIAL

## 2020-02-11 SURGICAL SUPPLY — 13 items
CATH OMNI FLUSH 5F 65CM (CATHETERS) ×1 IMPLANT
CATH QUICKCROSS SUPP .035X90CM (MICROCATHETER) ×1 IMPLANT
DEVICE TORQUE H2O (MISCELLANEOUS) ×1 IMPLANT
GUIDEWIRE ANGLED .035X150CM (WIRE) ×1 IMPLANT
KIT MICROPUNCTURE NIT STIFF (SHEATH) ×1 IMPLANT
KIT PV (KITS) ×2 IMPLANT
SHEATH PINNACLE 5F 10CM (SHEATH) ×1 IMPLANT
SHEATH PINNACLE MP 7F 45CM (SHEATH) ×1 IMPLANT
SHEATH PROBE COVER 6X72 (BAG) ×1 IMPLANT
SYR MEDRAD MARK V 150ML (SYRINGE) ×1 IMPLANT
TRANSDUCER W/STOPCOCK (MISCELLANEOUS) ×2 IMPLANT
TRAY PV CATH (CUSTOM PROCEDURE TRAY) ×2 IMPLANT
WIRE BENTSON .035X145CM (WIRE) ×1 IMPLANT

## 2020-02-11 NOTE — Progress Notes (Signed)
Site area: Right groin a 7 frebnch long sheath was removed  Site Prior to Removal:  Level 0  Pressure Applied For 40 MINUTES    Bedrest Beginning at 1655p  Manual:   yes  Patient Status During Pull:  stable  Post Pull Groin Site:  Level 0  Post Pull Instructions Given:  Yes.    Post Pull Pulses Present:  Yes.    Dressing Applied:  Yes.    Comments:

## 2020-02-11 NOTE — Interval H&P Note (Signed)
History and Physical Interval Note:  02/11/2020 12:32 PM  Jenna Brown  has presented today for surgery, with the diagnosis of pad.  The various methods of treatment have been discussed with the patient and family. After consideration of risks, benefits and other options for treatment, the patient has consented to  Procedure(s): ABDOMINAL AORTOGRAM W/LOWER EXTREMITY (N/A) as a surgical intervention.  The patient's history has been reviewed, patient examined, no change in status, stable for surgery.  I have reviewed the patient's chart and labs.  Questions were answered to the patient's satisfaction.     Annamarie Major

## 2020-02-11 NOTE — Discharge Instructions (Signed)
° °  [facility] [place] ° °Discharge Instructions ° °Lower Extremity Angiogram; Angioplasty/Stenting ° °Please refer to the following instructions for your post-procedure care. Your surgeon or physician assistant will discuss any changes with you. ° °Activity ° °Avoid lifting more than 8 pounds (1 gallons of milk) for 72 hours (3 days) after your procedure. You may walk as much as you can tolerate. It's OK to drive after 72 hours. ° °Bathing/Showering ° °You may shower the day after your procedure. If you have a bandage, you may remove it at 24- 48 hours. Clean your incision site with mild soap and water. Pat the area dry with a clean towel. ° °Diet ° °Resume your pre-procedure diet. There are no special food restrictions following this procedure. All patients with peripheral vascular disease should follow a low fat/low cholesterol diet. In order to heal from your surgery, it is CRITICAL to get adequate nutrition. Your body requires vitamins, minerals, and protein. Vegetables are the best source of vitamins and minerals. Vegetables also provide the perfect balance of protein. Processed food has little nutritional value, so try to avoid this. ° °Medications ° °Resume taking all of your medications unless your doctor tells you not to. If your incision is causing pain, you may take over-the-counter pain relievers such as acetaminophen (Tylenol) ° °Follow Up ° °Follow up will be arranged at the time of your procedure. You may have an office visit scheduled or may be scheduled for surgery. Ask your surgeon if you have any questions. ° °Please call us immediately for any of the following conditions: °•Severe or worsening pain your legs or feet at rest or with walking. °•Increased pain, redness, drainage at your groin puncture site. °•Fever of 101 degrees or higher. °•If you have any mild or slow bleeding from your puncture site: lie down, apply firm constant pressure over the area with a piece of  gauze or a clean wash cloth for 30 minutes- no peeking!, call 911 right away if you are still bleeding after 30 minutes, or if the bleeding is heavy and unmanageable. ° °Reduce your risk factors of vascular disease: ° °Stop smoking. If you would like help call [facility] at 1-800-QUIT-NOW (1-800-784-8669) or [place] at 336-586-4000. °Manage your cholesterol °Maintain a desired weight °Control your diabetes °Keep your blood pressure down ° °If you have any questions, please call the office at [phone] ° °

## 2020-02-11 NOTE — Op Note (Signed)
Patient name: Jenna Brown MRN: [id number] DOB: 1950-10-31 Sex: female  02/11/2020 Pre-operative Diagnosis: Left toe ulcer Post-operative diagnosis:  Same Surgeon:  Annamarie Major Procedure Performed:  1.  Ultrasound-guided access, right femoral artery  2.  Abdominal aortogram  3.  Left lower extremity runoff  4.  Third order catheterization  5.  Failed angioplasty, left superficial femoral artery  6.  Conscious sedation 40 minutes     Indications: The patient has previously had multiple interventions on the right leg and ultimately a above-knee amputation.  She now has a ulcer on her left great toe she comes in today for vascular evaluation.  Procedure:  The patient was identified in the holding area and taken to room 8.  The patient was then placed supine on the table and prepped and draped in the usual sterile fashion.  A time out was called.  Conscious sedation was administered with the use of IV fentanyl and Versed under continuous physician and nurse monitoring.  Heart rate, blood pressure, and oxygen saturation were continuously monitored.  Total sedation time was 40 minutes.  Ultrasound was used to evaluate the right common femoral artery.  It was patent .  A digital ultrasound image was acquired.  A micropuncture needle was used to access the right common femoral artery under ultrasound guidance.  An 018 wire was advanced without resistance and a micropuncture sheath was placed.  The 018 wire was removed and a benson wire was placed.  The micropuncture sheath was exchanged for a 5 french sheath.  An omniflush catheter was advanced over the wire to the level of L-1.  An abdominal angiogram was obtained.  Next, using the omniflush catheter and a benson wire, the aortic bifurcation was crossed and the catheter was placed into theleft external iliac artery and left runoff was obtained.    Findings:   Aortogram: No significant renal artery stenosis was identified.  The infrarenal  abdominal aorta is widely patent.  Bilateral common and external iliac arteries are widely patent  Right Lower Extremity: Not evaluated secondary to above amputation  Left Lower Extremity: Left common femoral profundofemoral artery widely patent.  The superficial femoral artery is patent without stenosis down to just above the mid thigh.  It occludes for a distance of approximately 10 to 15 cm and reconstitutes a diseased artery at the adductor canal.  The reconstituted above-knee popliteal artery appears to be diseased.  The below-knee popliteal artery is patent without stenosis but small in caliber.  There is single-vessel runoff via the peroneal artery.  Intervention: After the above images were acquired the decision made to proceed with intervention.  Over a Bentson wire, a 7 French 45 cm sheath was advanced into the left superficial femoral artery.  Patient is fully heparinized.  I then used a 035 Glidewire and a quick cross and attempted to cross the total occlusion.  I was unable to gain reentry and developed a significant dissection plane which prevented getting into the appropriate plane.  After multiple attempts, I elected to stop and bring the patient back for a repeat attempt versus surgical revascularization.  Manual pressure was used for sheath removal  Impression:  #1  Left superficial femoral and popliteal artery occlusion with reconstitution of the popliteal artery with single-vessel runoff via the peroneal artery  #2  Failed attempt at recanalization of the occluded superficial femoral-popliteal artery.  The patient could either come back for a repeat attempt at percutaneous revascularization or a left femoral to  below-knee popliteal artery bypass graft.  The sore on her toe does appear to be improving and so I will monitor her for another 3 to 4 weeks and bring her back to the office and make a final decision.     Theotis Burrow, M.D., [facility]  [place] Office: [phone] Pager:  [phone]

## 2020-02-12 DIAGNOSIS — N186 End stage renal disease: Secondary | ICD-10-CM | POA: Diagnosis not present

## 2020-02-12 DIAGNOSIS — N2581 Secondary hyperparathyroidism of renal origin: Secondary | ICD-10-CM | POA: Diagnosis not present

## 2020-02-12 DIAGNOSIS — R7881 Bacteremia: Secondary | ICD-10-CM | POA: Diagnosis not present

## 2020-02-12 DIAGNOSIS — L089 Local infection of the skin and subcutaneous tissue, unspecified: Secondary | ICD-10-CM | POA: Diagnosis not present

## 2020-02-12 DIAGNOSIS — D631 Anemia in chronic kidney disease: Secondary | ICD-10-CM | POA: Diagnosis not present

## 2020-02-12 DIAGNOSIS — E1121 Type 2 diabetes mellitus with diabetic nephropathy: Secondary | ICD-10-CM | POA: Diagnosis not present

## 2020-02-12 MED FILL — Lidocaine HCl Local Preservative Free (PF) Inj 1%: INTRAMUSCULAR | Qty: 30 | Status: AC

## 2020-02-14 DIAGNOSIS — D631 Anemia in chronic kidney disease: Secondary | ICD-10-CM | POA: Diagnosis not present

## 2020-02-14 DIAGNOSIS — N2581 Secondary hyperparathyroidism of renal origin: Secondary | ICD-10-CM | POA: Diagnosis not present

## 2020-02-14 DIAGNOSIS — L089 Local infection of the skin and subcutaneous tissue, unspecified: Secondary | ICD-10-CM | POA: Diagnosis not present

## 2020-02-14 DIAGNOSIS — R7881 Bacteremia: Secondary | ICD-10-CM | POA: Diagnosis not present

## 2020-02-14 DIAGNOSIS — N186 End stage renal disease: Secondary | ICD-10-CM | POA: Diagnosis not present

## 2020-02-14 DIAGNOSIS — E1121 Type 2 diabetes mellitus with diabetic nephropathy: Secondary | ICD-10-CM | POA: Diagnosis not present

## 2020-02-17 DIAGNOSIS — R7881 Bacteremia: Secondary | ICD-10-CM | POA: Diagnosis not present

## 2020-02-17 DIAGNOSIS — Z992 Dependence on renal dialysis: Secondary | ICD-10-CM | POA: Diagnosis not present

## 2020-02-17 DIAGNOSIS — L089 Local infection of the skin and subcutaneous tissue, unspecified: Secondary | ICD-10-CM | POA: Diagnosis not present

## 2020-02-17 DIAGNOSIS — N186 End stage renal disease: Secondary | ICD-10-CM | POA: Diagnosis not present

## 2020-02-17 DIAGNOSIS — D631 Anemia in chronic kidney disease: Secondary | ICD-10-CM | POA: Diagnosis not present

## 2020-02-17 DIAGNOSIS — N2581 Secondary hyperparathyroidism of renal origin: Secondary | ICD-10-CM | POA: Diagnosis not present

## 2020-02-17 DIAGNOSIS — E1121 Type 2 diabetes mellitus with diabetic nephropathy: Secondary | ICD-10-CM | POA: Diagnosis not present

## 2020-03-02 ENCOUNTER — Ambulatory Visit: Payer: Medicare Other

## 2020-03-03 ENCOUNTER — Other Ambulatory Visit: Payer: Self-pay

## 2020-03-03 ENCOUNTER — Ambulatory Visit (INDEPENDENT_AMBULATORY_CARE_PROVIDER_SITE_OTHER): Payer: Medicare Other | Admitting: Physician Assistant

## 2020-03-03 VITALS — BP 131/66 | HR 79 | Temp 97.0°F | Resp 18 | Ht 64.0 in | Wt 155.0 lb

## 2020-03-03 DIAGNOSIS — I779 Disorder of arteries and arterioles, unspecified: Secondary | ICD-10-CM

## 2020-03-03 DIAGNOSIS — I998 Other disorder of circulatory system: Secondary | ICD-10-CM

## 2020-03-03 DIAGNOSIS — I70229 Atherosclerosis of native arteries of extremities with rest pain, unspecified extremity: Secondary | ICD-10-CM

## 2020-03-03 NOTE — H&P (View-Only) (Signed)
HISTORY AND PHYSICAL     CC:  follow up. Requesting Provider:  [name], MD  HPI: This is a 69 y.o. female who is here today for follow up for failed angioplasty on 02/11/2020 by Dr. [name], which was done for a left toe ulcer that occurred around mid May from running over her foot with the wheel chair.  At her visit on 5/21, she had some rest pain at night.  Her ABI was non compressible with a TBI of 0.17.  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  pt has ESRD and dialyzes in [facility].   The pt returns today for follow up and wound check.   The pt is on a statin for cholesterol management.    The pt is on an aspirin.    Other AC:  none The pt is on BB for hypertension.  The pt does have diabetes. Tobacco hx:  Former-quit 2013    Past Medical History:  Diagnosis Date  . Anemia   . Anxiety    takes Xanax prn  . Arthritis   . Cataract    left eye  . CKD (chronic kidney disease), stage IV ([place])   . Coronary artery disease 2016   cath w/ 90% LAD, 95%CFX, 80% OM 2, 60% RCA, not CABG candidate, rx medically  . Full dentures   . GERD (gastroesophageal reflux disease)   . H/O hiatal hernia   . Headache(784.0)    when b/p is elevated  . Hemorrhoids   . Hx of AKA (above knee amputation), right ([place])   . Hyperlipidemia    takes Simvastatin daily  . Hypertension    takes Amlodipine/HCTZ/Losartan daily  . Migraines   . Myocardial infarction ([place]) 1990's  . NSTEMI (non-ST elevated myocardial infarction) ([place])   . Peripheral vascular disease ([place])   . PONV (postoperative nausea and vomiting)   . Stroke [facility])    TIA history  . Type II diabetes mellitus (HCC)    on Lantus  . Vertigo    takes Ativert prn    Past Surgical History:  Procedure Laterality Date  . ABDOMINAL AORTAGRAM N/A 02/29/2012   Procedure: ABDOMINAL Maxcine Ham;  Surgeon: [name], MD;  Location: [facility] CATH LAB;   Service: Cardiovascular;  Laterality: N/A;  . ABDOMINAL AORTOGRAM W/LOWER EXTREMITY N/A 02/11/2020   Procedure: ABDOMINAL AORTOGRAM W/LOWER EXTREMITY;  Surgeon: [name], MD;  Location: [place] CV LAB;  Service: Cardiovascular;  Laterality: N/A;  . AMPUTATION  05/10/2012   Procedure: AMPUTATION ABOVE KNEE;  Surgeon: [name], MD;  Location: [place] OR;  Service: Vascular;  Laterality: Right;   open right groin wound noted  . aortogram  02/2012  . AV FISTULA PLACEMENT Right 03/04/2019   Procedure: ARTERIOVENOUS (AV) FISTULA CREATION;  Surgeon: Rosetta Posner, MD;  Location: [place];  Service: Vascular;  Laterality: Right;  . CARDIAC CATHETERIZATION N/A 04/13/2015   Procedure: Right/Left Heart Cath and Coronary Angiography;  Surgeon: [name], MD;  Location: [place] CV LAB;  Service: Cardiovascular;  Laterality: N/A;  . CLEFT PALATE REPAIR     several  . COLONOSCOPY    . DILATION AND CURETTAGE OF UTERUS    . FEMORAL-POPLITEAL BYPASS GRAFT  04/05/2012   Procedure: BYPASS GRAFT FEMORAL-POPLITEAL ARTERY;  Surgeon: [name], MD;  Location: [place];  Service: Vascular;  Laterality: Right;  REVISION  . FEMORAL-TIBIAL BYPASS GRAFT  03/29/2012  Procedure: BYPASS GRAFT FEMORAL-TIBIAL ARTERY;  Surgeon: [name], MD;  Location: [place] OR;  Service: Vascular;  Laterality: Right;  Right femoral to Posterior Tibialis with composite graft of 12mm x 80 cm ringed gortex graft and saphenous vein  ,intraoperative arteriogram  . FEMOROPOPLITEAL THROMBECTOMY / EMBOLECTOMY  05/09/2012  . IR FLUORO GUIDE CV LINE RIGHT  03/01/2019  . IR US GUIDE VASC ACCESS RIGHT  03/01/2019  . MULTIPLE TOOTH EXTRACTIONS    . MYOMECTOMY    . PR VEIN BYPASS GRAFT,AORTO-FEM-POP  05/27/11   Right SFA-Below knee Pop BP  . REVISION OF ARTERIOVENOUS GORETEX GRAFT Right 08/21/2019   Procedure: REVISION OF ARTERIOVENOUS FISTULA RIGHT ARM;  Surgeon: Rosetta Posner, MD;  Location: [facility] OR;  Service: Vascular;  Laterality:  Right;  . TEE WITHOUT CARDIOVERSION N/A 01/03/2017   Procedure: TRANSESOPHAGEAL ECHOCARDIOGRAM (TEE);  Surgeon: Skeet Latch, MD;  Location: [place];  Service: Cardiovascular;  Laterality: N/A;  . THORACENTESIS  02/27/2019      . TONSILLECTOMY    . TUBAL LIGATION  ~ 1990    Allergies  Allergen Reactions  . Plavix [Clopidogrel Bisulfate] Itching  . Codeine Other (See Comments)    "makes me feel strange"  . Tylenol With Codeine #3 [Acetaminophen-Codeine] Other (See Comments)    "doesn't make me feel right"    Current Outpatient Medications  Medication Sig Dispense Refill  . acetaminophen (TYLENOL) 500 MG tablet Take 500 mg by mouth every 6 (six) hours as needed for mild pain.    Marland Kitchen ALPRAZolam (XANAX) 0.25 MG tablet Take 1 tablet (0.25 mg total) by mouth 3 (three) times daily as needed for anxiety. 12 tablet 0  . aspirin 81 MG chewable tablet Chew 81 mg by mouth daily.    Marland Kitchen atorvastatin (LIPITOR) 80 MG tablet Take 80 mg by mouth daily.     . carvedilol (COREG) 6.25 MG tablet Take 6.25 mg by mouth 2 (two) times daily with a meal.    . cholecalciferol (VITAMIN D) 25 MCG (1000 UT) tablet Take 1,000 Units by mouth daily.    Marland Kitchen docusate sodium (COLACE) 100 MG capsule Take 100 mg by mouth daily as needed for mild constipation.    . insulin glargine (LANTUS) 100 UNIT/ML injection Inject 0.2 mLs (20 Units total) into the skin at bedtime. (Patient taking differently: Inject 5 Units into the skin at bedtime. ) 10 mL 11  . multivitamin (RENA-VIT) TABS tablet Take 1 tablet by mouth at bedtime. (Patient not taking: Reported on 02/10/2020) 30 tablet 2  . nitroGLYCERIN (NITROSTAT) 0.4 MG SL tablet Place 1 tablet (0.4 mg total) under the tongue every 5 (five) minutes x 3 doses as needed for chest pain. 25 tablet prn  . RENVELA 800 MG tablet Take 800 mg by mouth See admin instructions. Take 1 tablet (800 mg) by mouth with each meal up to 5 times daily     No current facility-administered medications  for this visit.    Family History  Problem Relation Age of Onset  . Cancer Mother        breast  . Diabetes Father   . Cancer Brother   . Heart attack Maternal Grandmother   . Heart attack Maternal Grandfather     Social History   Socioeconomic History  . Marital status: Single    Spouse name: Not on file  . Number of children: Not on file  . Years of education: Not on file  . Highest education level: Not on file  Occupational History  . Occupation: disabled  Tobacco Use  . Smoking status: Former Smoker    Packs/day: 0.25    Years: 40.00    Pack years: 10.00    Types: Cigarettes    Quit date: 02/27/2012    Years since quitting: 8.0  . Smokeless tobacco: Never Used  Vaping Use  . Vaping Use: Never used  Substance and Sexual Activity  . Alcohol use: No  . Drug use: No  . Sexual activity: Never    Birth control/protection: Post-menopausal  Other Topics Concern  . Not on file  Social History Narrative   She lives in [place] with family.   Social Determinants of Health   Financial Resource Strain:   . Difficulty of Paying Living Expenses:   Food Insecurity:   . Worried About Charity fundraiser in the Last Year:   . Arboriculturist in the Last Year:   Transportation Needs:   . Film/video editor (Medical):   Marland Kitchen Lack of Transportation (Non-Medical):   Physical Activity:   . Days of Exercise per Week:   . Minutes of Exercise per Session:   Stress:   . Feeling of Stress :   Social Connections:   . Frequency of Communication with Friends and Family:   . Frequency of Social Gatherings with Friends and Family:   . Attends Religious Services:   . Active Member of Clubs or Organizations:   . Attends Archivist Meetings:   Marland Kitchen Marital Status:   Intimate Partner Violence:   . Fear of Current or Ex-Partner:   . Emotionally Abused:   Marland Kitchen Physically Abused:   . Sexually Abused:      REVIEW OF SYSTEMS:   [X]  denotes positive finding, [ ]  denotes  negative finding Cardiac  Comments:  Chest pain or chest pressure:    Shortness of breath upon exertion:    Short of breath when lying flat:    Irregular heart rhythm:        Vascular    Pain in calf, thigh, or hip brought on by ambulation:    Pain in feet at night that wakes you up from your sleep:  x   Blood clot in your veins:    Leg swelling:         Pulmonary    Oxygen at home:    Productive cough:     Wheezing:         Neurologic    Sudden weakness in arms or legs:     Sudden numbness in arms or legs:     Sudden onset of difficulty speaking or slurred speech:    Temporary loss of vision in one eye:     Problems with dizziness:         Gastrointestinal    Blood in stool:     Vomited blood:         Genitourinary    Burning when urinating:     Blood in urine:        Psychiatric    Major depression:         Hematologic    Bleeding problems:    Problems with blood clotting too easily:        Skin    Rashes or ulcers: x       Constitutional    Fever or chills:      PHYSICAL EXAMINATION:  Today's Vitals   03/03/20 1326  BP: 131/66  Pulse: 79  Resp:  18  Temp: (!) 97 F (36.1 C)  TempSrc: Temporal  SpO2: 97%  Weight: 155 lb (70.3 kg)  Height: 5\' 4"  (1.626 m)  PainSc: 10-Worst pain ever   Body mass index is 26.61 kg/m.   General:  WDWN in NAD; vital signs documented above Gait in wheelchair  HENT: WNL, normocephalic Pulmonary: normal non-labored breathing , without wheezing Cardiac: regular HR, without  Murmur; Abdomen: soft, NT, no masses Skin: without rashes Vascular Exam/Pulses:  Right Left  DP amputation Monophasic doppler  PT amputation Monophasic doppler   Extremities: Toe wound is dry without drainage.        Musculoskeletal: no muscle wasting or atrophy  Neurologic: A&O X 3;  No focal weakness or paresthesias are detected Psychiatric:  The pt is tearful   Non-Invasive Vascular Imaging:   None today  Arteriogram  02/11/2020: Impression:             #1  Left superficial femoral and popliteal artery occlusion with reconstitution of the popliteal artery with single-vessel runoff via the peroneal artery             #2  Failed attempt at recanalization of the occluded superficial femoral-popliteal artery.  The patient could either come back for a repeat attempt at percutaneous revascularization or a left femoral to below-knee popliteal artery bypass graft.  The sore on her toe does appear to be improving and so I will monitor her for another 3 to 4 weeks and bring her back to the office and make a final decision.   ASSESSMENT/PLAN:: 69 y.o. female here for follow up for arteriogram with non healing wound  -pt with monophasic doppler flow left DP/PT.  Continues to have rest pain at night.  She states she feels her toe wound has improved.  She does have a wound on the lateral aspect of the foot.  -discussed with Dr. [name] and he is going to repeat angiogram on 03/17/2020.  Pt does not want to have surgery and is tearful and fearful and states she does not want to loose her leg.   -Dr. [name] to call and discuss with pt.   -If the decision is made to eventually go with femoral to popliteal bypass, she will need vein mapping.  -continue statin/asa   Leontine Locket, [facility] [phone]  Clinic MD:   Carlis Abbott

## 2020-03-03 NOTE — Progress Notes (Signed)
HISTORY AND PHYSICAL     CC:  follow up. Requesting Provider:  [name], MD  HPI: This is a 69 y.o. female who is here today for follow up for failed angioplasty on 02/11/2020 by Dr. [name], which was done for a left toe ulcer that occurred around mid May from running over her foot with the wheel chair.  At her visit on 5/21, she had some rest pain at night.  Her ABI was non compressible with a TBI of 0.17.  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  pt has ESRD and dialyzes in [facility].   The pt returns today for follow up and wound check.   The pt is on a statin for cholesterol management.    The pt is on an aspirin.    Other AC:  none The pt is on BB for hypertension.  The pt does have diabetes. Tobacco hx:  Former-quit 2013    Past Medical History:  Diagnosis Date  . Anemia   . Anxiety    takes Xanax prn  . Arthritis   . Cataract    left eye  . CKD (chronic kidney disease), stage IV ([place])   . Coronary artery disease 2016   cath w/ 90% LAD, 95%CFX, 80% OM 2, 60% RCA, not CABG candidate, rx medically  . Full dentures   . GERD (gastroesophageal reflux disease)   . H/O hiatal hernia   . Headache(784.0)    when b/p is elevated  . Hemorrhoids   . Hx of AKA (above knee amputation), right ([place])   . Hyperlipidemia    takes Simvastatin daily  . Hypertension    takes Amlodipine/HCTZ/Losartan daily  . Migraines   . Myocardial infarction ([place]) 1990's  . NSTEMI (non-ST elevated myocardial infarction) ([place])   . Peripheral vascular disease ([place])   . PONV (postoperative nausea and vomiting)   . Stroke [facility])    TIA history  . Type II diabetes mellitus (HCC)    on Lantus  . Vertigo    takes Ativert prn    Past Surgical History:  Procedure Laterality Date  . ABDOMINAL AORTAGRAM N/A 02/29/2012   Procedure: ABDOMINAL Maxcine Ham;  Surgeon: [name], MD;  Location: [facility] CATH LAB;   Service: Cardiovascular;  Laterality: N/A;  . ABDOMINAL AORTOGRAM W/LOWER EXTREMITY N/A 02/11/2020   Procedure: ABDOMINAL AORTOGRAM W/LOWER EXTREMITY;  Surgeon: [name], MD;  Location: [place] CV LAB;  Service: Cardiovascular;  Laterality: N/A;  . AMPUTATION  05/10/2012   Procedure: AMPUTATION ABOVE KNEE;  Surgeon: [name], MD;  Location: [place] OR;  Service: Vascular;  Laterality: Right;   open right groin wound noted  . aortogram  02/2012  . AV FISTULA PLACEMENT Right 03/04/2019   Procedure: ARTERIOVENOUS (AV) FISTULA CREATION;  Surgeon: Rosetta Posner, MD;  Location: [place];  Service: Vascular;  Laterality: Right;  . CARDIAC CATHETERIZATION N/A 04/13/2015   Procedure: Right/Left Heart Cath and Coronary Angiography;  Surgeon: [name], MD;  Location: [place] CV LAB;  Service: Cardiovascular;  Laterality: N/A;  . CLEFT PALATE REPAIR     several  . COLONOSCOPY    . DILATION AND CURETTAGE OF UTERUS    . FEMORAL-POPLITEAL BYPASS GRAFT  04/05/2012   Procedure: BYPASS GRAFT FEMORAL-POPLITEAL ARTERY;  Surgeon: [name], MD;  Location: [place];  Service: Vascular;  Laterality: Right;  REVISION  . FEMORAL-TIBIAL BYPASS GRAFT  03/29/2012  Procedure: BYPASS GRAFT FEMORAL-TIBIAL ARTERY;  Surgeon: [name], MD;  Location: [place] OR;  Service: Vascular;  Laterality: Right;  Right femoral to Posterior Tibialis with composite graft of 29mm x 80 cm ringed gortex graft and saphenous vein  ,intraoperative arteriogram  . FEMOROPOPLITEAL THROMBECTOMY / EMBOLECTOMY  05/09/2012  . IR FLUORO GUIDE CV LINE RIGHT  03/01/2019  . IR US GUIDE VASC ACCESS RIGHT  03/01/2019  . MULTIPLE TOOTH EXTRACTIONS    . MYOMECTOMY    . PR VEIN BYPASS GRAFT,AORTO-FEM-POP  05/27/11   Right SFA-Below knee Pop BP  . REVISION OF ARTERIOVENOUS GORETEX GRAFT Right 08/21/2019   Procedure: REVISION OF ARTERIOVENOUS FISTULA RIGHT ARM;  Surgeon: Rosetta Posner, MD;  Location: [facility] OR;  Service: Vascular;  Laterality:  Right;  . TEE WITHOUT CARDIOVERSION N/A 01/03/2017   Procedure: TRANSESOPHAGEAL ECHOCARDIOGRAM (TEE);  Surgeon: Skeet Latch, MD;  Location: [place];  Service: Cardiovascular;  Laterality: N/A;  . THORACENTESIS  02/27/2019      . TONSILLECTOMY    . TUBAL LIGATION  ~ 1990    Allergies  Allergen Reactions  . Plavix [Clopidogrel Bisulfate] Itching  . Codeine Other (See Comments)    "makes me feel strange"  . Tylenol With Codeine #3 [Acetaminophen-Codeine] Other (See Comments)    "doesn't make me feel right"    Current Outpatient Medications  Medication Sig Dispense Refill  . acetaminophen (TYLENOL) 500 MG tablet Take 500 mg by mouth every 6 (six) hours as needed for mild pain.    Marland Kitchen ALPRAZolam (XANAX) 0.25 MG tablet Take 1 tablet (0.25 mg total) by mouth 3 (three) times daily as needed for anxiety. 12 tablet 0  . aspirin 81 MG chewable tablet Chew 81 mg by mouth daily.    Marland Kitchen atorvastatin (LIPITOR) 80 MG tablet Take 80 mg by mouth daily.     . carvedilol (COREG) 6.25 MG tablet Take 6.25 mg by mouth 2 (two) times daily with a meal.    . cholecalciferol (VITAMIN D) 25 MCG (1000 UT) tablet Take 1,000 Units by mouth daily.    Marland Kitchen docusate sodium (COLACE) 100 MG capsule Take 100 mg by mouth daily as needed for mild constipation.    . insulin glargine (LANTUS) 100 UNIT/ML injection Inject 0.2 mLs (20 Units total) into the skin at bedtime. (Patient taking differently: Inject 5 Units into the skin at bedtime. ) 10 mL 11  . multivitamin (RENA-VIT) TABS tablet Take 1 tablet by mouth at bedtime. (Patient not taking: Reported on 02/10/2020) 30 tablet 2  . nitroGLYCERIN (NITROSTAT) 0.4 MG SL tablet Place 1 tablet (0.4 mg total) under the tongue every 5 (five) minutes x 3 doses as needed for chest pain. 25 tablet prn  . RENVELA 800 MG tablet Take 800 mg by mouth See admin instructions. Take 1 tablet (800 mg) by mouth with each meal up to 5 times daily     No current facility-administered medications  for this visit.    Family History  Problem Relation Age of Onset  . Cancer Mother        breast  . Diabetes Father   . Cancer Brother   . Heart attack Maternal Grandmother   . Heart attack Maternal Grandfather     Social History   Socioeconomic History  . Marital status: Single    Spouse name: Not on file  . Number of children: Not on file  . Years of education: Not on file  . Highest education level: Not on file  Occupational History  . Occupation: disabled  Tobacco Use  . Smoking status: Former Smoker    Packs/day: 0.25    Years: 40.00    Pack years: 10.00    Types: Cigarettes    Quit date: 02/27/2012    Years since quitting: 8.0  . Smokeless tobacco: Never Used  Vaping Use  . Vaping Use: Never used  Substance and Sexual Activity  . Alcohol use: No  . Drug use: No  . Sexual activity: Never    Birth control/protection: Post-menopausal  Other Topics Concern  . Not on file  Social History Narrative   She lives in [place] with family.   Social Determinants of Health   Financial Resource Strain:   . Difficulty of Paying Living Expenses:   Food Insecurity:   . Worried About Charity fundraiser in the Last Year:   . Arboriculturist in the Last Year:   Transportation Needs:   . Film/video editor (Medical):   Marland Kitchen Lack of Transportation (Non-Medical):   Physical Activity:   . Days of Exercise per Week:   . Minutes of Exercise per Session:   Stress:   . Feeling of Stress :   Social Connections:   . Frequency of Communication with Friends and Family:   . Frequency of Social Gatherings with Friends and Family:   . Attends Religious Services:   . Active Member of Clubs or Organizations:   . Attends Archivist Meetings:   Marland Kitchen Marital Status:   Intimate Partner Violence:   . Fear of Current or Ex-Partner:   . Emotionally Abused:   Marland Kitchen Physically Abused:   . Sexually Abused:      REVIEW OF SYSTEMS:   [X]  denotes positive finding, [ ]  denotes  negative finding Cardiac  Comments:  Chest pain or chest pressure:    Shortness of breath upon exertion:    Short of breath when lying flat:    Irregular heart rhythm:        Vascular    Pain in calf, thigh, or hip brought on by ambulation:    Pain in feet at night that wakes you up from your sleep:  x   Blood clot in your veins:    Leg swelling:         Pulmonary    Oxygen at home:    Productive cough:     Wheezing:         Neurologic    Sudden weakness in arms or legs:     Sudden numbness in arms or legs:     Sudden onset of difficulty speaking or slurred speech:    Temporary loss of vision in one eye:     Problems with dizziness:         Gastrointestinal    Blood in stool:     Vomited blood:         Genitourinary    Burning when urinating:     Blood in urine:        Psychiatric    Major depression:         Hematologic    Bleeding problems:    Problems with blood clotting too easily:        Skin    Rashes or ulcers: x       Constitutional    Fever or chills:      PHYSICAL EXAMINATION:  Today's Vitals   03/03/20 1326  BP: 131/66  Pulse: 79  Resp:  18  Temp: (!) 97 F (36.1 C)  TempSrc: Temporal  SpO2: 97%  Weight: 155 lb (70.3 kg)  Height: 5\' 4"  (1.626 m)  PainSc: 10-Worst pain ever   Body mass index is 26.61 kg/m.   General:  WDWN in NAD; vital signs documented above Gait in wheelchair  HENT: WNL, normocephalic Pulmonary: normal non-labored breathing , without wheezing Cardiac: regular HR, without  Murmur; Abdomen: soft, NT, no masses Skin: without rashes Vascular Exam/Pulses:  Right Left  DP amputation Monophasic doppler  PT amputation Monophasic doppler   Extremities: Toe wound is dry without drainage.        Musculoskeletal: no muscle wasting or atrophy  Neurologic: A&O X 3;  No focal weakness or paresthesias are detected Psychiatric:  The pt is tearful   Non-Invasive Vascular Imaging:   None today  Arteriogram  02/11/2020: Impression:             #1  Left superficial femoral and popliteal artery occlusion with reconstitution of the popliteal artery with single-vessel runoff via the peroneal artery             #2  Failed attempt at recanalization of the occluded superficial femoral-popliteal artery.  The patient could either come back for a repeat attempt at percutaneous revascularization or a left femoral to below-knee popliteal artery bypass graft.  The sore on her toe does appear to be improving and so I will monitor her for another 3 to 4 weeks and bring her back to the office and make a final decision.   ASSESSMENT/PLAN:: 68 y.o. female here for follow up for arteriogram with non healing wound  -pt with monophasic doppler flow left DP/PT.  Continues to have rest pain at night.  She states she feels her toe wound has improved.  She does have a wound on the lateral aspect of the foot.  -discussed with Dr. [name] and he is going to repeat angiogram on 03/17/2020.  Pt does not want to have surgery and is tearful and fearful and states she does not want to loose her leg.   -Dr. [name] to call and discuss with pt.   -If the decision is made to eventually go with femoral to popliteal bypass, she will need vein mapping.  -continue statin/asa   Leontine Locket, [facility] [phone]  Clinic MD:   Carlis Abbott

## 2020-03-05 ENCOUNTER — Other Ambulatory Visit: Payer: Self-pay

## 2020-03-05 MED ORDER — SODIUM CHLORIDE 0.9% FLUSH: 3.0000 mL | Freq: Two times a day (BID) | INTRAVENOUS | Status: DC

## 2020-03-05 MED ORDER — SODIUM CHLORIDE 0.9 % IV SOLN
250.0000 mL | INTRAVENOUS | Status: DC | PRN
Start: 2020-03-05 — End: 2020-03-17

## 2020-03-05 MED ORDER — SODIUM CHLORIDE 0.9% FLUSH: 3.0000 mL | INTRAVENOUS | Status: DC | PRN

## 2020-03-17 ENCOUNTER — Ambulatory Visit (HOSPITAL_COMMUNITY)
Admission: RE | Admit: 2020-03-17 | Discharge: 2020-03-17 | Disposition: A | Discharge status: Home / Self Care | Payer: Medicare Other | Source: Ambulatory Visit | Attending: Surgery | Admitting: Surgery

## 2020-03-17 ENCOUNTER — Other Ambulatory Visit: Payer: Self-pay

## 2020-03-17 ENCOUNTER — Encounter (HOSPITAL_COMMUNITY)
Admission: RE | Disposition: A | Discharge status: Home / Self Care | Payer: Self-pay | Source: Ambulatory Visit | Attending: Surgery

## 2020-03-17 DIAGNOSIS — E11621 Type 2 diabetes mellitus with foot ulcer: Secondary | ICD-10-CM | POA: Diagnosis present

## 2020-03-17 DIAGNOSIS — E1136 Type 2 diabetes mellitus with diabetic cataract: Secondary | ICD-10-CM | POA: Diagnosis not present

## 2020-03-17 DIAGNOSIS — Z79899 Other long term (current) drug therapy: Secondary | ICD-10-CM | POA: Insufficient documentation

## 2020-03-17 DIAGNOSIS — Z992 Dependence on renal dialysis: Secondary | ICD-10-CM | POA: Insufficient documentation

## 2020-03-17 DIAGNOSIS — Z8673 Personal history of transient ischemic attack (TIA), and cerebral infarction without residual deficits: Secondary | ICD-10-CM | POA: Diagnosis not present

## 2020-03-17 DIAGNOSIS — Z885 Allergy status to narcotic agent status: Secondary | ICD-10-CM | POA: Insufficient documentation

## 2020-03-17 DIAGNOSIS — Z794 Long term (current) use of insulin: Secondary | ICD-10-CM | POA: Insufficient documentation

## 2020-03-17 DIAGNOSIS — Z8249 Family history of ischemic heart disease and other diseases of the circulatory system: Secondary | ICD-10-CM | POA: Insufficient documentation

## 2020-03-17 DIAGNOSIS — I252 Old myocardial infarction: Secondary | ICD-10-CM | POA: Insufficient documentation

## 2020-03-17 DIAGNOSIS — I25118 Atherosclerotic heart disease of native coronary artery with other forms of angina pectoris: Secondary | ICD-10-CM

## 2020-03-17 DIAGNOSIS — M1990 Unspecified osteoarthritis, unspecified site: Secondary | ICD-10-CM | POA: Diagnosis not present

## 2020-03-17 DIAGNOSIS — Z95828 Presence of other vascular implants and grafts: Secondary | ICD-10-CM | POA: Diagnosis not present

## 2020-03-17 DIAGNOSIS — I129 Hypertensive chronic kidney disease with stage 1 through stage 4 chronic kidney disease, or unspecified chronic kidney disease: Secondary | ICD-10-CM | POA: Insufficient documentation

## 2020-03-17 DIAGNOSIS — Z7982 Long term (current) use of aspirin: Secondary | ICD-10-CM | POA: Diagnosis not present

## 2020-03-17 DIAGNOSIS — Z888 Allergy status to other drugs, medicaments and biological substances status: Secondary | ICD-10-CM | POA: Diagnosis not present

## 2020-03-17 DIAGNOSIS — Z87891 Personal history of nicotine dependence: Secondary | ICD-10-CM | POA: Diagnosis not present

## 2020-03-17 DIAGNOSIS — I739 Peripheral vascular disease, unspecified: Secondary | ICD-10-CM | POA: Diagnosis not present

## 2020-03-17 DIAGNOSIS — Z89611 Acquired absence of right leg above knee: Secondary | ICD-10-CM | POA: Insufficient documentation

## 2020-03-17 DIAGNOSIS — I70245 Atherosclerosis of native arteries of left leg with ulceration of other part of foot: Secondary | ICD-10-CM

## 2020-03-17 DIAGNOSIS — N186 End stage renal disease: Secondary | ICD-10-CM | POA: Diagnosis not present

## 2020-03-17 DIAGNOSIS — E785 Hyperlipidemia, unspecified: Secondary | ICD-10-CM | POA: Insufficient documentation

## 2020-03-17 DIAGNOSIS — K219 Gastro-esophageal reflux disease without esophagitis: Secondary | ICD-10-CM | POA: Insufficient documentation

## 2020-03-17 DIAGNOSIS — L97529 Non-pressure chronic ulcer of other part of left foot with unspecified severity: Secondary | ICD-10-CM | POA: Insufficient documentation

## 2020-03-17 DIAGNOSIS — I2510 Atherosclerotic heart disease of native coronary artery without angina pectoris: Secondary | ICD-10-CM | POA: Diagnosis not present

## 2020-03-17 DIAGNOSIS — E1122 Type 2 diabetes mellitus with diabetic chronic kidney disease: Secondary | ICD-10-CM | POA: Insufficient documentation

## 2020-03-17 DIAGNOSIS — Z833 Family history of diabetes mellitus: Secondary | ICD-10-CM | POA: Diagnosis not present

## 2020-03-17 HISTORY — PX: ABDOMINAL AORTOGRAM W/LOWER EXTREMITY: CATH118223

## 2020-03-17 HISTORY — PX: PERIPHERAL VASCULAR INTERVENTION: CATH118257

## 2020-03-17 LAB — GLUCOSE, CAPILLARY
Glucose-Capillary: 106 mg/dL — ABNORMAL HIGH (ref 70–99)
Glucose-Capillary: 61 mg/dL — ABNORMAL LOW (ref 70–99)
Glucose-Capillary: 239 mg/dL — ABNORMAL HIGH (ref 70–99)

## 2020-03-17 LAB — POCT I-STAT, CHEM 8
BUN: 34 mg/dL — ABNORMAL HIGH (ref 8–23)
Calcium, Ion: 1.08 mmol/L — ABNORMAL LOW (ref 1.15–1.40)
Chloride: 98 mmol/L (ref 98–111)
Creatinine, Ser: 4.6 mg/dL — ABNORMAL HIGH (ref 0.44–1.00)
Glucose, Bld: 93 mg/dL (ref 70–99)
HCT: 32 % — ABNORMAL LOW (ref 36.0–46.0)
Hemoglobin: 10.9 g/dL — ABNORMAL LOW (ref 12.0–15.0)
Potassium: 4.6 mmol/L (ref 3.5–5.1)
Sodium: 136 mmol/L (ref 135–145)
TCO2: 32 mmol/L (ref 22–32)

## 2020-03-17 LAB — POCT ACTIVATED CLOTTING TIME: Activated Clotting Time: 246 seconds

## 2020-03-17 SURGERY — ABDOMINAL AORTOGRAM W/LOWER EXTREMITY
Anesthesia: LOCAL

## 2020-03-17 MED ORDER — LABETALOL HCL 5 MG/ML IV SOLN: 10.0000 mg | INTRAVENOUS | Status: DC | PRN

## 2020-03-17 MED ORDER — MORPHINE SULFATE (PF) 2 MG/ML IV SOLN: 2.0000 mg | INTRAVENOUS | Status: DC | PRN

## 2020-03-17 MED ORDER — ASPIRIN EC 81 MG PO TBEC: 81.0000 mg | DELAYED_RELEASE_TABLET | Freq: Every day | ORAL | Status: DC

## 2020-03-17 MED ORDER — MIDAZOLAM HCL 5 MG/5ML IJ SOLN
INTRAMUSCULAR | Status: AC
  Filled 2020-03-17: qty 5

## 2020-03-17 MED ORDER — LIDOCAINE HCL (PF) 1 % IJ SOLN
INTRAMUSCULAR | Status: DC | PRN
  Administered 2020-03-17: 18 mL

## 2020-03-17 MED ORDER — NITROGLYCERIN 0.4 MG SL SUBL
SUBLINGUAL_TABLET | SUBLINGUAL | Status: AC
  Administered 2020-03-17: 0.4 mg
  Filled 2020-03-17: qty 1

## 2020-03-17 MED ORDER — SODIUM CHLORIDE 0.9 % IV SOLN: 250.0000 mL | INTRAVENOUS | Status: DC | PRN

## 2020-03-17 MED ORDER — TICAGRELOR 90 MG PO TABS
ORAL_TABLET | ORAL | Status: AC
  Filled 2020-03-17: qty 2

## 2020-03-17 MED ORDER — HEPARIN (PORCINE) IN NACL 1000-0.9 UT/500ML-% IV SOLN
INTRAVENOUS | Status: DC | PRN
  Administered 2020-03-17 (×2): 500 mL

## 2020-03-17 MED ORDER — FENTANYL CITRATE (PF) 100 MCG/2ML IJ SOLN
INTRAMUSCULAR | Status: AC
  Filled 2020-03-17: qty 2

## 2020-03-17 MED ORDER — TICAGRELOR 90 MG PO TABS: 90.0000 mg | ORAL_TABLET | Freq: Two times a day (BID) | ORAL | 3 refills | Status: DC

## 2020-03-17 MED ORDER — HEPARIN SODIUM (PORCINE) 1000 UNIT/ML IJ SOLN
INTRAMUSCULAR | Status: DC | PRN
  Administered 2020-03-17: 7000 [IU] via INTRAVENOUS

## 2020-03-17 MED ORDER — SODIUM CHLORIDE 0.9% FLUSH: 3.0000 mL | Freq: Two times a day (BID) | INTRAVENOUS | Status: DC

## 2020-03-17 MED ORDER — MIDAZOLAM HCL 2 MG/2ML IJ SOLN
INTRAMUSCULAR | Status: DC | PRN
  Administered 2020-03-17 (×3): 1 mg via INTRAVENOUS

## 2020-03-17 MED ORDER — HEPARIN (PORCINE) IN NACL 1000-0.9 UT/500ML-% IV SOLN
INTRAVENOUS | Status: AC
  Filled 2020-03-17: qty 1000

## 2020-03-17 MED ORDER — FENTANYL CITRATE (PF) 100 MCG/2ML IJ SOLN
INTRAMUSCULAR | Status: DC | PRN
  Administered 2020-03-17 (×3): 25 ug via INTRAVENOUS

## 2020-03-17 MED ORDER — NITROGLYCERIN 0.4 MG SL SUBL: 0.4000 mg | SUBLINGUAL_TABLET | SUBLINGUAL | Status: DC | PRN

## 2020-03-17 MED ORDER — SODIUM CHLORIDE 0.9% FLUSH: 3.0000 mL | INTRAVENOUS | Status: DC | PRN

## 2020-03-17 MED ORDER — SODIUM CHLORIDE 0.9 % IV SOLN: INTRAVENOUS | Status: DC

## 2020-03-17 MED ORDER — LIDOCAINE HCL (PF) 1 % IJ SOLN
INTRAMUSCULAR | Status: AC
  Filled 2020-03-17: qty 30

## 2020-03-17 MED ORDER — ONDANSETRON HCL 4 MG/2ML IJ SOLN
4.0000 mg | Freq: Four times a day (QID) | INTRAMUSCULAR | Status: DC | PRN
  Administered 2020-03-17: 4 mg via INTRAVENOUS
  Filled 2020-03-17: qty 2

## 2020-03-17 MED ORDER — HYDRALAZINE HCL 20 MG/ML IJ SOLN: 5.0000 mg | INTRAMUSCULAR | Status: DC | PRN

## 2020-03-17 MED ORDER — TICAGRELOR 90 MG PO TABS
ORAL_TABLET | ORAL | Status: DC | PRN
  Administered 2020-03-17: 180 mg via ORAL

## 2020-03-17 MED ORDER — HEPARIN SODIUM (PORCINE) 1000 UNIT/ML IJ SOLN
INTRAMUSCULAR | Status: AC
  Filled 2020-03-17: qty 1

## 2020-03-17 SURGICAL SUPPLY — 21 items
BALLN JADE .035 5.0 X 150 (BALLOONS) ×3
BALLOON JADE .035 5.0 X 150 (BALLOONS) IMPLANT
CATH OMNI FLUSH 5F 65CM (CATHETERS) ×1 IMPLANT
CATH QUICKCROSS SUPP .035X90CM (MICROCATHETER) ×1 IMPLANT
DEVICE CLOSURE MYNXGRIP 6/7F (Vascular Products) ×1 IMPLANT
DEVICE TORQUE H2O (MISCELLANEOUS) ×2 IMPLANT
GUIDEWIRE ANGLED .035X150CM (WIRE) ×1 IMPLANT
KIT ENCORE 26 ADVANTAGE (KITS) ×1 IMPLANT
KIT MICROPUNCTURE NIT STIFF (SHEATH) ×2 IMPLANT
KIT PV (KITS) ×3 IMPLANT
SHEATH PINNACLE 5F 10CM (SHEATH) ×1 IMPLANT
SHEATH PINNACLE 6F 10CM (SHEATH) ×1 IMPLANT
SHEATH PINNACLE ST 6F 45CM (SHEATH) ×1 IMPLANT
STENT ELUVIA 6X120X130 (Permanent Stent) ×1 IMPLANT
STENT TIGRIS 5X100X120 (Permanent Stent) ×1 IMPLANT
SYR MEDRAD MARK 7 150ML (SYRINGE) ×3 IMPLANT
TRANSDUCER W/STOPCOCK (MISCELLANEOUS) ×3 IMPLANT
TRAY PV CATH (CUSTOM PROCEDURE TRAY) ×3 IMPLANT
TUBING CIL FLEX 10 FLL-RA (TUBING) ×2 IMPLANT
WIRE G V18X300CM (WIRE) ×2 IMPLANT
WIRE HI TORQ VERSACORE 300 (WIRE) ×2 IMPLANT

## 2020-03-17 NOTE — Op Note (Signed)
    Patient name: Jenna Brown MRN: [id number] DOB: 03-Jan-1951 Sex: female  03/17/2020 Pre-operative Diagnosis: Left foot ulcer Post-operative diagnosis:  Same Surgeon:  Annamarie Major Procedure Performed:  1.  Ultrasound-guided access, right femoral artery  2.  Stent, left superficial femoral and popliteal artery  3.  Conscious sedation, 87 minutes  4.  Closure device, Mynx    Indications: The patient has a nonhealing wound on her left foot.  I had previously attempted recanalization of the occluded popliteal artery but was unsuccessful.  She comes back today for a second attempt.  Procedure:  The patient was identified in the holding area and taken to room 8.  The patient was then placed supine on the table and prepped and draped in the usual sterile fashion.  A time out was called.  Conscious sedation was administered with the use of IV fentanyl and Versed under continuous physician and nurse monitoring.  Heart rate, blood pressure, and oxygen saturation were continuously monitored.  Total sedation time was 87 minutes.  Ultrasound was used to evaluate the right common femoral artery.  It was patent .  A digital ultrasound image was acquired.  A micropuncture needle was used to access the right common femoral artery under ultrasound guidance.  An 018 wire was advanced without resistance and a micropuncture sheath was placed.  The 018 wire was removed and a benson wire was placed.  The micropuncture sheath was exchanged for a 5 french sheath.  An omniflush catheter and a Glidewire were used to cross the aortic bifurcation.  I then inserted a 6 French 45 cm sheath into the left superficial femoral artery.  The patient was then fully heparinized.  Next using an 035 Glidewire and a quick cross catheter, subintimal recanalization was performed.  Successful reentry was obtained in the popliteal artery just below the joint space.  This was confirmed with contrast injection.  I then predilated the  subintimal tract with a 5 mm balloon.  I placed a Tigris 5 x 100 stent in the popliteal artery, crossing the joint space.  This was overlapped proximally using a 6 x 120 Elluvia stent.  These were then postdilated with a 5 mm balloon.  Completion imaging was then performed which showed successful revascularization.  There is now inline flow through the superficial femoral, popliteal artery with preserved single-vessel runoff via the anterior tibial artery.   Impression:  #1  Successful subintimal recanalization and stenting of an occluded popliteal and superficial femoral artery using a Tigris 5 x 100 distally and a Elluvia 6 x 120.    Theotis Burrow, M.D., [facility] [place] Office: [phone] Pager:  [phone]

## 2020-03-17 NOTE — Interval H&P Note (Signed)
History and Physical Interval Note:  03/17/2020 8:37 AM  Jenna Brown  has presented today for surgery, with the diagnosis of ischemia.  The various methods of treatment have been discussed with the patient and family. After consideration of risks, benefits and other options for treatment, the patient has consented to  Procedure(s): ABDOMINAL AORTOGRAM W/LOWER EXTREMITY (N/A) as a surgical intervention.  The patient's history has been reviewed, patient examined, no change in status, stable for surgery.  I have reviewed the patient's chart and labs.  Questions were answered to the patient's satisfaction.     Annamarie Major

## 2020-03-17 NOTE — Discharge Instructions (Signed)
Femoral Site Care This sheet gives you information about how to care for yourself after your procedure. Your health care provider may also give you more specific instructions. If you have problems or questions, contact your health care provider. What can I expect after the procedure? After the procedure, it is common to have:  Bruising that usually fades within 1-2 weeks.  Tenderness at the site. Follow these instructions at home: Wound care  Follow instructions from your health care provider about how to take care of your insertion site. Make sure you: ? Wash your hands with soap and water before you change your bandage (dressing). If soap and water are not available, use hand sanitizer. ? Change your dressing as told by your health care provider. ? Leave stitches (sutures), skin glue, or adhesive strips in place. These skin closures may need to stay in place for 2 weeks or longer. If adhesive strip edges start to loosen and curl up, you may trim the loose edges. Do not remove adhesive strips completely unless your health care provider tells you to do that.  Do not take baths, swim, or use a hot tub until your health care provider approves.  You may shower 24-48 hours after the procedure or as told by your health care provider. ? Gently wash the site with plain soap and water. ? Pat the area dry with a clean towel. ? Do not rub the site. This may cause bleeding.  Do not apply powder or lotion to the site. Keep the site clean and dry.  Check your femoral site every day for signs of infection. Check for: ? Redness, swelling, or pain. ? Fluid or blood. ? Warmth. ? Pus or a bad smell. Activity  For the first 2-3 days after your procedure, or as long as directed: ? Avoid climbing stairs as much as possible. ? Do not squat.  Do not lift anything that is heavier than 10 lb (4.5 kg), or the limit that you are told, until your health care provider says that it is safe.  Rest as  directed. ? Avoid sitting for a long time without moving. Get up to take short walks every 1-2 hours.  Do not drive for 24 hours if you were given a medicine to help you relax (sedative). General instructions  Take over-the-counter and prescription medicines only as told by your health care provider.  Keep all follow-up visits as told by your health care provider. This is important. Contact a health care provider if you have:  A fever or chills.  You have redness, swelling, or pain around your insertion site. Get help right away if:  The catheter insertion area swells very fast.  You pass out.  You suddenly start to sweat or your skin gets clammy.  The catheter insertion area is bleeding, and the bleeding does not stop when you hold steady pressure on the area.  The area near or just beyond the catheter insertion site becomes pale, cool, tingly, or numb. These symptoms may represent a serious problem that is an emergency. Do not wait to see if the symptoms will go away. Get medical help right away. Call your local emergency services (911 in the [place] Do not drive yourself to the hospital. Summary  After the procedure, it is common to have bruising that usually fades within 1-2 weeks.  Check your femoral site every day for signs of infection.  Do not lift anything that is heavier than 10 lb (4.5 kg), or the   limit that you are told, until your health care provider says that it is safe. This information is not intended to replace advice given to you by your health care provider. Make sure you discuss any questions you have with your health care provider. Document Revised: 09/18/2017 Document Reviewed: 09/18/2017 [employer] Patient Education  2020 [employer]    CALL Dr Blenda Mounts office if more chest pain or come to ER

## 2020-03-17 NOTE — Consult Note (Addendum)
Cardiology Consultation:   Patient ID: Jenna Brown MRN: [id number]; DOB: 08/02/1951  Admit date: 03/17/2020 Date of Consult: 03/17/2020  Primary Care Provider: [name], MD [facility] Cardiologist: Skeet Latch, MD  [facility] Electrophysiologist:  None    Patient Profile:   Jenna Brown is a 69 y.o. female with a hx of severe coronary disease, treated medically.  Catheterization in 2016 showed a 90% proximal LAD, 95% in-stent restenosis in the proximal circumflex as well as mid circumflex of 90% and OM stenosis of 80%.  Her RCA is large and has a 50 to 60% narrowing.  not a candidate for CABG, She has a history of past combined heart failure, her echocardiogram in November 2019 showed an ejection fraction of 45%.  She has end-stage renal disease and is on dialysis.  hx of HF with thoracentesis 02/2019.  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 [name].  History of Present Illness:   Ms. Weimann with severe CAD as above and not a candidate for CABG, HD, DM, PAD with hx of Rt AKA now in short stay post peripheral angiogram with stent to Lt SFA and popliteal artery.  She did have brilinta in the cat lab.  Back on short stay post procedure she developed chest pain mid sternal and to left in chest, 9/10 pain and felt she could pass out.   She did vomit and then after 1 SL NTG pain was resolved.    EKG:  The EKG was personally reviewed and demonstrates:  EKG from 0900 with deep T wave inversions in lateral leads V3-6 then with pain, EKGs with improved T wave inversion  But ST depression in inf and lateral leads.   Telemetry:  Telemetry was personally reviewed and demonstrates:  SR  She has these episodes at home but "once in a blue moon" she has pain, vomits and then takes NTG.  But no recent chest pain at home. Currently she tells me she is pain free.  Per nurse she was pale  with the pain and diaphoretic. No significant SOB.   Echo in 2019 with EF 45-50% and trivial MR  Last cardiac cath 2016  90% proximal LAD, 95% in-stent restenosis in the proximal circumflex as well as mid circumflex of 90% and OM stenosis of 80%.  Her RCA is large and has a 50 to 60% narrowing.  not a candidate for CABG  BP now 166/82 P 91 to 104 afebrile.  Labs today K+ 4.6 Na 136, Cr 4.60 and BUN 34  Hgb 10.9 and Hct 32.   Past Medical History:  Diagnosis Date   Anemia    Anxiety    takes Xanax prn   Arthritis    Cataract    left eye   CKD (chronic kidney disease), stage IV (HCC)    Coronary artery disease 2016   cath w/ 90% LAD, 95%CFX, 80% OM 2, 60% RCA, not CABG candidate, rx medically   Full dentures    GERD (gastroesophageal reflux disease)    H/O hiatal hernia    Headache(784.0)    when b/p is elevated   Hemorrhoids    Hx of AKA (above knee amputation), right (HCC)    Hyperlipidemia    takes Simvastatin daily   Hypertension    takes Amlodipine/HCTZ/Losartan daily   Migraines    Myocardial infarction ([place]) 1990's   NSTEMI (  non-ST elevated myocardial infarction) ([place])    Peripheral vascular disease (HCC)    PONV (postoperative nausea and vomiting)    Stroke ([place])    TIA history   Type II diabetes mellitus ([place])    on Lantus   Vertigo    takes Ativert prn    Past Surgical History:  Procedure Laterality Date   ABDOMINAL AORTAGRAM N/A 02/29/2012   Procedure: ABDOMINAL Maxcine Ham;  Surgeon: [name], MD;  Location: [facility] CATH LAB;  Service: Cardiovascular;  Laterality: N/A;   ABDOMINAL AORTOGRAM W/LOWER EXTREMITY N/A 02/11/2020   Procedure: ABDOMINAL AORTOGRAM W/LOWER EXTREMITY;  Surgeon: [name], MD;  Location: [place] CV LAB;  Service: Cardiovascular;  Laterality: N/A;   AMPUTATION  05/10/2012   Procedure: AMPUTATION ABOVE KNEE;  Surgeon: [name], MD;  [facility];  Service: Vascular;  Laterality: Right;   open  right groin wound noted   aortogram  02/2012   AV FISTULA PLACEMENT Right 03/04/2019   Procedure: ARTERIOVENOUS (AV) FISTULA CREATION;  Surgeon: Rosetta Posner, MD;  Location: [place];  Service: Vascular;  Laterality: Right;   CARDIAC CATHETERIZATION N/A 04/13/2015   Procedure: Right/Left Heart Cath and Coronary Angiography;  Surgeon: [name], MD;  Location: [place] CV LAB;  Service: Cardiovascular;  Laterality: N/A;   CLEFT PALATE REPAIR     several   COLONOSCOPY     DILATION AND CURETTAGE OF UTERUS     FEMORAL-POPLITEAL BYPASS GRAFT  04/05/2012   Procedure: BYPASS GRAFT FEMORAL-POPLITEAL ARTERY;  Surgeon: [name], MD;  Location: [place];  Service: Vascular;  Laterality: Right;  REVISION   FEMORAL-TIBIAL BYPASS GRAFT  03/29/2012   Procedure: BYPASS GRAFT FEMORAL-TIBIAL ARTERY;  Surgeon: [name], MD;  Location: [facility] OR;  Service: Vascular;  Laterality: Right;  Right femoral to Posterior Tibialis with composite graft of 81mm x 80 cm ringed gortex graft and saphenous vein  ,intraoperative arteriogram   FEMOROPOPLITEAL THROMBECTOMY / EMBOLECTOMY  05/09/2012   IR FLUORO GUIDE CV LINE RIGHT  03/01/2019   IR US GUIDE VASC ACCESS RIGHT  03/01/2019   MULTIPLE TOOTH EXTRACTIONS     MYOMECTOMY     PR VEIN BYPASS GRAFT,AORTO-FEM-POP  05/27/11   Right SFA-Below knee Pop BP   REVISION OF ARTERIOVENOUS GORETEX GRAFT Right 08/21/2019   Procedure: REVISION OF ARTERIOVENOUS FISTULA RIGHT ARM;  Surgeon: Rosetta Posner, MD;  Location: [facility] OR;  Service: Vascular;  Laterality: Right;   TEE WITHOUT CARDIOVERSION N/A 01/03/2017   Procedure: TRANSESOPHAGEAL ECHOCARDIOGRAM (TEE);  Surgeon: Skeet Latch, MD;  Location: [place];  Service: Cardiovascular;  Laterality: N/A;   THORACENTESIS  02/27/2019       TONSILLECTOMY     TUBAL LIGATION  ~ 1990     Home Medications:  Prior to Admission medications   Medication Sig Start Date End Date Taking? Authorizing Provider    acetaminophen (TYLENOL) 500 MG tablet Take 500 mg by mouth every 6 (six) hours as needed for mild pain.   Yes [provider]  ALPRAZolam (XANAX) 0.25 MG tablet Take 1 tablet (0.25 mg total) by mouth 3 (three) times daily as needed for anxiety. 01/03/17  Yes Carlota Raspberry, Tiffany, PA-C  aspirin 81 MG chewable tablet Chew 81 mg by mouth daily.   Yes [provider]  atorvastatin (LIPITOR) 80 MG tablet Take 80 mg by mouth daily.  01/09/18  Yes [provider]  carvedilol (COREG) 25 MG tablet Take 25 mg by mouth daily.  Yes [provider]  cholecalciferol (VITAMIN D) 25 MCG (1000 UT) tablet Take 1,000 Units by mouth daily.   Yes [provider]  docusate sodium (COLACE) 100 MG capsule Take 100 mg by mouth daily as needed for mild constipation.   Yes [provider]  insulin glargine (LANTUS) 100 UNIT/ML injection Inject 0.2 mLs (20 Units total) into the skin at bedtime. Patient taking differently: Inject 5 Units into the skin at bedtime.  03/06/19  Yes Georgette Shell, MD  nitroGLYCERIN (NITROSTAT) 0.4 MG SL tablet Place 1 tablet (0.4 mg total) under the tongue every 5 (five) minutes x 3 doses as needed for chest pain. 05/31/19  Yes Skeet Latch, MD  RENVELA 800 MG tablet Take 800 mg by mouth 3 (three) times daily with meals.    Yes [provider]  multivitamin (RENA-VIT) TABS tablet Take 1 tablet by mouth at bedtime. Patient not taking: Reported on 03/10/2020 03/06/19   Georgette Shell, MD  ticagrelor (BRILINTA) 90 MG TABS tablet Take 1 tablet (90 mg total) by mouth 2 (two) times daily. 03/17/20   [name], MD    Inpatient Medications: Scheduled Meds:  [START ON 03/18/2020] aspirin EC  81 mg Oral Daily   sodium chloride flush  3 mL Intravenous Q12H   Continuous Infusions:  sodium chloride     sodium chloride     PRN Meds: sodium chloride, hydrALAZINE, labetalol, morphine injection, nitroGLYCERIN, ondansetron  (ZOFRAN) IV, sodium chloride flush  Allergies:    Allergies  Allergen Reactions   Plavix [Clopidogrel Bisulfate] Itching   Codeine Other (See Comments)    "makes me feel strange"   Tylenol With Codeine #3 [Acetaminophen-Codeine] Other (See Comments)    "doesn't make me feel right"    Social History:   Social History   Socioeconomic History   Marital status: Single    Spouse name: Not on file   Number of children: Not on file   Years of education: Not on file   Highest education level: Not on file  Occupational History   Occupation: disabled  Tobacco Use   Smoking status: Former Smoker    Packs/day: 0.25    Years: 40.00    Pack years: 10.00    Types: Cigarettes    Quit date: 02/27/2012    Years since quitting: 8.0   Smokeless tobacco: Never Used  Vaping Use   Vaping Use: Never used  Substance and Sexual Activity   Alcohol use: No   Drug use: No   Sexual activity: Never    Birth control/protection: Post-menopausal  Other Topics Concern   Not on file  Social History Narrative   She lives in [place] with family.   Social Determinants of Health   Financial Resource Strain:    Difficulty of Paying Living Expenses:   Food Insecurity:    Worried About Charity fundraiser in the Last Year:    Arboriculturist in the Last Year:   Transportation Needs:    Film/video editor (Medical):    Lack of Transportation (Non-Medical):   Physical Activity:    Days of Exercise per Week:    Minutes of Exercise per Session:   Stress:    Feeling of Stress :   Social Connections:    Frequency of Communication with Friends and Family:    Frequency of Social Gatherings with Friends and Family:    Attends Religious Services:    Active Member of Clubs or Organizations:  Attends Music therapist:    Marital Status:   Intimate Partner Violence:    Fear of Current or Ex-Partner:    Emotionally Abused:    Physically Abused:     Sexually Abused:     Family History:    Family History  Problem Relation Age of Onset   Cancer Mother        breast   Diabetes Father    Cancer Brother    Heart attack Maternal Grandmother    Heart attack Maternal Grandfather      ROS:  Please see the history of present illness.  General:no colds or fevers, no weight changes Skin:no rashes or ulcers HEENT:no blurred vision, no congestion CV:see HPI PUL:see HPI GI:no diarrhea constipation or melena, no indigestion GU:no hematuria, no dysuria MS:no joint pain, + claudication Neuro:no syncope, no lightheadedness Endo:no diabetes, no thyroid disease  All other ROS reviewed and negative.     Physical Exam/Data:   Vitals:   03/17/20 1327 03/17/20 1357 03/17/20 1412 03/17/20 1437  BP: (!) 164/52 (!) 171/74 (!) 199/85 (!) 166/82  Pulse: 91 91 (!) 104 (!) 104  Resp:      Temp:      TempSrc:      SpO2: 100% 100% 100% 100%  Weight:      Height:       No intake or output data in the 24 hours ending 03/17/20 1449 Last 3 Weights 03/17/2020 03/03/2020 02/11/2020  Weight (lbs) 153 lb 155 lb 153 lb  Weight (kg) 69.4 kg 70.308 kg 69.4 kg     Body mass index is 26.26 kg/m.  General:  Well nourished, well developed, in no acute distress now HEENT: normal Lymph: no adenopathy Neck: no JVD Endocrine:  No thryomegaly Vascular: No carotid bruits; Rt AKA and Lt with stents today  Cardiac:  normal S1, S2; RRR; no murmur gallup rub or click Lungs:  clear to auscultation bilaterally, no wheezing, rhonchi or rales  Abd: soft, nontender, no hepatomegaly  Ext: no edema Musculoskeletal:  Rt AKA Skin: warm and dry  Neuro:  Alert and oriented X 3 MAE follows exam, no focal abnormalities noted Psych:  Normal affect     Relevant CV Studies: 2016 cardiac cath  Prox LAD to Mid LAD lesion, 90% stenosed.  Ost Cx to Mid Cx lesion, 95% stenosed.  Ost 2nd Mrg to 2nd Mrg lesion, 80% stenosed.  Mid Cx lesion, 90% stenosed.  Prox RCA  lesion, 60% stenosed.  Mid RCA lesion, 50% stenosed.   Diagnostic Dominance: Right   ECHO 08/01/18 Study Conclusions   - Left ventricle: The cavity size was normal. There was mild focal  basal hypertrophy of the septum. Systolic function was mildly  reduced. The estimated ejection fraction was in the range of 45%  to 50%. Hypokinesis of the apicalinferolateral, inferior,  inferoseptal, and apical myocardium.  - Aortic valve: Trileaflet; mildly thickened, mildly calcified  leaflets. Left coronary cusp mobility was moderately restricted.  - Mitral valve: Calcified annulus. Mildly thickened leaflets .  There was trivial regurgitation.  - Right ventricle: The cavity size was mildly dilated. Wall  thickness was normal. Systolic function was low normal.  - Atrial septum: No defect or patent foramen ovale was identified.  - Tricuspid valve: There was no significant regurgitation.  - Pulmonic valve: There was trivial regurgitation.   Impressions:   - EF using wall tracking approximately 45%. Hypokinesis of distal  inferior, inferolateral, inferoseptal, and apical walls. Wall  motion visually  appears similar to prior. Thickened LCC of aortic  valve appears similar to prior.   Laboratory Data:  High Sensitivity Troponin:  No results for input(s): TROPONINIHS in the last 720 hours.   Chemistry Recent Labs  Lab 03/17/20 0858  NA 136  K 4.6  CL 98  GLUCOSE 93  BUN 34*  CREATININE 4.60*    No results for input(s): PROT, ALBUMIN, AST, ALT, ALKPHOS, BILITOT in the last 168 hours. Hematology Recent Labs  Lab 03/17/20 0858  HGB 10.9*  HCT 32.0*   BNPNo results for input(s): BNP, PROBNP in the last 168 hours.  DDimer No results for input(s): DDIMER in the last 168 hours.   Radiology/Studies:  PERIPHERAL VASCULAR CATHETERIZATION  Result Date: 03/17/2020 Patient name: LASHAUNDRA LEHRMANN MRN: [id number] DOB: [birth year] Sex: female 03/17/2020 Pre-operative  Diagnosis: Left foot ulcer Post-operative diagnosis:  Same Surgeon:  Annamarie Major Procedure Performed:  1.  Ultrasound-guided access, right femoral artery  2.  Stent, left superficial femoral and popliteal artery  3.  Conscious sedation, 87 minutes  4.  Closure device, Mynx Indications: The patient has a nonhealing wound on her left foot.  I had previously attempted recanalization of the occluded popliteal artery but was unsuccessful.  She comes back today for a second attempt. Procedure:  The patient was identified in the holding area and taken to room 8.  The patient was then placed supine on the table and prepped and draped in the usual sterile fashion.  A time out was called.  Conscious sedation was administered with the use of IV fentanyl and Versed under continuous physician and nurse monitoring.  Heart rate, blood pressure, and oxygen saturation were continuously monitored.  Total sedation time was 87 minutes.  Ultrasound was used to evaluate the right common femoral artery.  It was patent .  A digital ultrasound image was acquired.  A micropuncture needle was used to access the right common femoral artery under ultrasound guidance.  An 018 wire was advanced without resistance and a micropuncture sheath was placed.  The 018 wire was removed and a benson wire was placed.  The micropuncture sheath was exchanged for a 5 french sheath.  An omniflush catheter and a Glidewire were used to cross the aortic bifurcation.  I then inserted a 6 French 45 cm sheath into the left superficial femoral artery.  The patient was then fully heparinized.  Next using an 035 Glidewire and a quick cross catheter, subintimal recanalization was performed.  Successful reentry was obtained in the popliteal artery just below the joint space.  This was confirmed with contrast injection.  I then predilated the subintimal tract with a 5 mm balloon.  I placed a Tigris 5 x 100 stent in the popliteal artery, crossing the joint space.  This  was overlapped proximally using a 6 x 120 Elluvia stent.  These were then postdilated with a 5 mm balloon.  Completion imaging was then performed which showed successful revascularization.  There is now inline flow through the superficial femoral, popliteal artery with preserved single-vessel runoff via the anterior tibial artery. Impression:  #1  Successful subintimal recanalization and stenting of an occluded popliteal and superficial femoral artery using a Tigris 5 x 100 distally and a Elluvia 6 x 120. Theotis Burrow, M.D., FACS [facility] Office: [phone] Pager:  [phone]      HEAR Score (for undifferentiated chest pain):     7   Assessment and Plan:   1. Chest  pain, she does have GERD and did receive Brilinta in cath lab, but this is similar to her angina.  I have ordered hs troponin and Dr. Irish Lack to see.  May need to keep overnight but will defer to Dr. Irish Lack.  She feels much better.   Ok to discharge per Dr. Irish Lack.    Will arrange follow up in 1-2 weeks.  2. CAD significant and not surgical candidate for CABG from 2016 3. ESRD on HD.   4. PAD with Rt AKA and today stents to Lt SFA and popliteal 5. Chronic combined HF -euvolemic today  6. IDDM with glucose in 200s  Had been NPO for procedure 7.  HLD on lipitor at 80.       For questions or updates, please contact [place] Please consult [email or web address] for contact info under    Signed, Cecilie Kicks, NP  03/17/2020 2:49 PM  I have examined the patient and reviewed assessment and plan and discussed with patient.  Agree with above as stated.    Patient with known CAD from prior cath.  Cath films reviewed.  She had some chest discomfort afterbeing NPO for procedure, then eating two [place] sandwiches post procedure.  She felt better after vomiting. She thinks this was stomach related.  No sx at this time.  OK to discharge.  If she has further sx, could consider ischemia w/u or  addition of isosorbide.    Of note, no cardiac symptoms during her dialysis treatments.  Larae Grooms

## 2020-03-17 NOTE — Progress Notes (Addendum)
Client called me to room c/o nausea, shortness of breath and chest pain. Color pale and diaphoretic; vomited about 300cc yellow liquid; Dr [name] notified

## 2020-03-18 ENCOUNTER — Encounter (HOSPITAL_COMMUNITY): Payer: Self-pay | Admitting: Surgery

## 2020-03-18 MED FILL — Midazolam HCl Inj 5 MG/5ML (Base Equivalent): INTRAMUSCULAR | Qty: 3 | Status: AC

## 2020-03-20 DIAGNOSIS — E785 Hyperlipidemia, unspecified: Secondary | ICD-10-CM | POA: Diagnosis not present

## 2020-03-20 DIAGNOSIS — D631 Anemia in chronic kidney disease: Secondary | ICD-10-CM | POA: Diagnosis not present

## 2020-03-20 DIAGNOSIS — N2581 Secondary hyperparathyroidism of renal origin: Secondary | ICD-10-CM | POA: Diagnosis not present

## 2020-03-20 DIAGNOSIS — D509 Iron deficiency anemia, unspecified: Secondary | ICD-10-CM | POA: Diagnosis not present

## 2020-03-20 DIAGNOSIS — E1121 Type 2 diabetes mellitus with diabetic nephropathy: Secondary | ICD-10-CM | POA: Diagnosis not present

## 2020-03-20 DIAGNOSIS — L03032 Cellulitis of left toe: Secondary | ICD-10-CM | POA: Diagnosis not present

## 2020-03-20 DIAGNOSIS — N186 End stage renal disease: Secondary | ICD-10-CM | POA: Diagnosis not present

## 2020-03-23 DIAGNOSIS — D631 Anemia in chronic kidney disease: Secondary | ICD-10-CM | POA: Diagnosis not present

## 2020-03-23 DIAGNOSIS — E1121 Type 2 diabetes mellitus with diabetic nephropathy: Secondary | ICD-10-CM | POA: Diagnosis not present

## 2020-03-23 DIAGNOSIS — E785 Hyperlipidemia, unspecified: Secondary | ICD-10-CM | POA: Diagnosis not present

## 2020-03-23 DIAGNOSIS — N2581 Secondary hyperparathyroidism of renal origin: Secondary | ICD-10-CM | POA: Diagnosis not present

## 2020-03-23 DIAGNOSIS — L03032 Cellulitis of left toe: Secondary | ICD-10-CM | POA: Diagnosis not present

## 2020-03-23 DIAGNOSIS — N186 End stage renal disease: Secondary | ICD-10-CM | POA: Diagnosis not present

## 2020-03-25 DIAGNOSIS — E1121 Type 2 diabetes mellitus with diabetic nephropathy: Secondary | ICD-10-CM | POA: Diagnosis not present

## 2020-03-25 DIAGNOSIS — E785 Hyperlipidemia, unspecified: Secondary | ICD-10-CM | POA: Diagnosis not present

## 2020-03-25 DIAGNOSIS — N2581 Secondary hyperparathyroidism of renal origin: Secondary | ICD-10-CM | POA: Diagnosis not present

## 2020-03-25 DIAGNOSIS — L03032 Cellulitis of left toe: Secondary | ICD-10-CM | POA: Diagnosis not present

## 2020-03-25 DIAGNOSIS — N186 End stage renal disease: Secondary | ICD-10-CM | POA: Diagnosis not present

## 2020-03-25 DIAGNOSIS — D631 Anemia in chronic kidney disease: Secondary | ICD-10-CM | POA: Diagnosis not present

## 2020-03-27 DIAGNOSIS — N2581 Secondary hyperparathyroidism of renal origin: Secondary | ICD-10-CM | POA: Diagnosis not present

## 2020-03-27 DIAGNOSIS — E1121 Type 2 diabetes mellitus with diabetic nephropathy: Secondary | ICD-10-CM | POA: Diagnosis not present

## 2020-03-27 DIAGNOSIS — E785 Hyperlipidemia, unspecified: Secondary | ICD-10-CM | POA: Diagnosis not present

## 2020-03-27 DIAGNOSIS — D631 Anemia in chronic kidney disease: Secondary | ICD-10-CM | POA: Diagnosis not present

## 2020-03-27 DIAGNOSIS — L03032 Cellulitis of left toe: Secondary | ICD-10-CM | POA: Diagnosis not present

## 2020-03-27 DIAGNOSIS — N186 End stage renal disease: Secondary | ICD-10-CM | POA: Diagnosis not present

## 2020-03-30 DIAGNOSIS — N2581 Secondary hyperparathyroidism of renal origin: Secondary | ICD-10-CM | POA: Diagnosis not present

## 2020-03-30 DIAGNOSIS — L03032 Cellulitis of left toe: Secondary | ICD-10-CM | POA: Diagnosis not present

## 2020-03-30 DIAGNOSIS — N186 End stage renal disease: Secondary | ICD-10-CM | POA: Diagnosis not present

## 2020-03-30 DIAGNOSIS — E785 Hyperlipidemia, unspecified: Secondary | ICD-10-CM | POA: Diagnosis not present

## 2020-03-30 DIAGNOSIS — D631 Anemia in chronic kidney disease: Secondary | ICD-10-CM | POA: Diagnosis not present

## 2020-03-30 DIAGNOSIS — E1121 Type 2 diabetes mellitus with diabetic nephropathy: Secondary | ICD-10-CM | POA: Diagnosis not present

## 2020-03-30 NOTE — Progress Notes (Deleted)
Cardiology Office Note   Date:  03/30/2020   ID:  Jenna, Brown Nov 20, 1950, MRN [id number]  PCP:  [name], MD  Cardiologist: Dr.Sheffield Lake  No chief complaint on file.    History of Present Illness: Jenna Brown is a 69 y.o. female who presents for posthospitalization follow-up, after being seen on consultation, by Dr. Irish Lack on 03/17/2020 for evaluation of chest pain occurring after lower extremity angiogram with placement of stent to the left SFA and popliteal arteries.  In short stay she developed chest pain midsternal and to the left chest describes 9 out of 10 pain and she felt she could pass out.  She did have some vomiting.  Was given 1 sublingual nitroglycerin and pain resolved.  Jenna Brown has a history of severe coronary artery disease treated medically.  Most recent catheterization 2016 showed 90% proximal LAD, 95% in-stent restenosis of proximal circumflex, and mid circumflex of 90%, along with element of stenosis of 80%.  Her RCA was found to be large and had a 56% narrowing.  She was not found to be a candidate for CABG.  Other history includes combined CHF, hyperlipidemia, TIA, and GERD, along with.  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  She also has a stent to the left FSA and popliteal artery.  She was placed on Brilinta.    Past Medical History:  Diagnosis Date   Anemia    Anxiety    takes Xanax prn   Arthritis    Cataract    left eye   CKD (chronic kidney disease), stage IV (HCC)    Coronary artery disease 2016   cath w/ 90% LAD, 95%CFX, 80% OM 2, 60% RCA, not CABG candidate, rx medically   Full dentures    GERD (gastroesophageal reflux disease)    H/O hiatal hernia    Headache(784.0)    when b/p is elevated   Hemorrhoids    Hx of AKA (above knee amputation), right (HCC)    Hyperlipidemia    takes Simvastatin daily   Hypertension    takes  Amlodipine/HCTZ/Losartan daily   Migraines    Myocardial infarction ([place]) 1990's   NSTEMI (non-ST elevated myocardial infarction) ([place])    Peripheral vascular disease (HCC)    PONV (postoperative nausea and vomiting)    Stroke ([place])    TIA history   Type II diabetes mellitus ([place])    on Lantus   Vertigo    takes Ativert prn    Past Surgical History:  Procedure Laterality Date   ABDOMINAL AORTAGRAM N/A 02/29/2012   Procedure: ABDOMINAL Maxcine Ham;  Surgeon: [name], MD;  Location: [facility] CATH LAB;  Service: Cardiovascular;  Laterality: N/A;   ABDOMINAL AORTOGRAM W/LOWER EXTREMITY N/A 02/11/2020   Procedure: ABDOMINAL AORTOGRAM W/LOWER EXTREMITY;  Surgeon: [name], MD;  Location: [place] CV LAB;  Service: Cardiovascular;  Laterality: N/A;   ABDOMINAL AORTOGRAM W/LOWER EXTREMITY N/A 03/17/2020   Procedure: ABDOMINAL AORTOGRAM W/LOWER EXTREMITY;  Surgeon: [name], MD;  Location: [place] CV LAB;  Service: Cardiovascular;  Laterality: N/A;   AMPUTATION  05/10/2012   Procedure: AMPUTATION ABOVE KNEE;  Surgeon: [name], MD;  Location: [facility] OR;  Service: Vascular;  Laterality: Right;   open right groin wound noted   aortogram  02/2012   AV FISTULA PLACEMENT Right 03/04/2019   Procedure: ARTERIOVENOUS (AV) FISTULA CREATION;  Surgeon: Rosetta Posner, MD;  Location: [place];  Service: Vascular;  Laterality: Right;   CARDIAC CATHETERIZATION N/A 04/13/2015   Procedure: Right/Left Heart Cath and Coronary Angiography;  Surgeon: [name], MD;  Location: [place] CV LAB;  Service: Cardiovascular;  Laterality: N/A;   CLEFT PALATE REPAIR     several   COLONOSCOPY     DILATION AND CURETTAGE OF UTERUS     FEMORAL-POPLITEAL BYPASS GRAFT  04/05/2012   Procedure: BYPASS GRAFT FEMORAL-POPLITEAL ARTERY;  Surgeon: [name], MD;  Location: [place];  Service: Vascular;  Laterality: Right;  REVISION   FEMORAL-TIBIAL BYPASS GRAFT  03/29/2012   Procedure:  BYPASS GRAFT FEMORAL-TIBIAL ARTERY;  Surgeon: [name], MD;  Location: [facility] OR;  Service: Vascular;  Laterality: Right;  Right femoral to Posterior Tibialis with composite graft of 42mm x 80 cm ringed gortex graft and saphenous vein  ,intraoperative arteriogram   FEMOROPOPLITEAL THROMBECTOMY / EMBOLECTOMY  05/09/2012   IR FLUORO GUIDE CV LINE RIGHT  03/01/2019   IR US GUIDE VASC ACCESS RIGHT  03/01/2019   MULTIPLE TOOTH EXTRACTIONS     MYOMECTOMY     PERIPHERAL VASCULAR INTERVENTION Left 03/17/2020   Procedure: PERIPHERAL VASCULAR INTERVENTION;  Surgeon: [name], MD;  Location: [place] CV LAB;  Service: Cardiovascular;  Laterality: Left;  popliteal   PR VEIN BYPASS GRAFT,AORTO-FEM-POP  05/27/11   Right SFA-Below knee Pop BP   REVISION OF ARTERIOVENOUS GORETEX GRAFT Right 08/21/2019   Procedure: REVISION OF ARTERIOVENOUS FISTULA RIGHT ARM;  Surgeon: Rosetta Posner, MD;  Location: [facility] OR;  Service: Vascular;  Laterality: Right;   TEE WITHOUT CARDIOVERSION N/A 01/03/2017   Procedure: TRANSESOPHAGEAL ECHOCARDIOGRAM (TEE);  Surgeon: Skeet Latch, MD;  Location: [place];  Service: Cardiovascular;  Laterality: N/A;   THORACENTESIS  02/27/2019       TONSILLECTOMY     TUBAL LIGATION  ~ 1990     Current Outpatient Medications  Medication Sig Dispense Refill   acetaminophen (TYLENOL) 500 MG tablet Take 500 mg by mouth every 6 (six) hours as needed for mild pain.     ALPRAZolam (XANAX) 0.25 MG tablet Take 1 tablet (0.25 mg total) by mouth 3 (three) times daily as needed for anxiety. 12 tablet 0   aspirin 81 MG chewable tablet Chew 81 mg by mouth daily.     atorvastatin (LIPITOR) 80 MG tablet Take 80 mg by mouth daily.      carvedilol (COREG) 25 MG tablet Take 25 mg by mouth daily.      cholecalciferol (VITAMIN D) 25 MCG (1000 UT) tablet Take 1,000 Units by mouth daily.     docusate sodium (COLACE) 100 MG capsule Take 100 mg by mouth daily as needed for mild  constipation.     insulin glargine (LANTUS) 100 UNIT/ML injection Inject 0.2 mLs (20 Units total) into the skin at bedtime. (Patient taking differently: Inject 5 Units into the skin at bedtime. ) 10 mL 11   nitroGLYCERIN (NITROSTAT) 0.4 MG SL tablet Place 1 tablet (0.4 mg total) under the tongue every 5 (five) minutes x 3 doses as needed for chest pain. 25 tablet prn   RENVELA 800 MG tablet Take 800 mg by mouth 3 (three) times daily with meals.      ticagrelor (BRILINTA) 90 MG TABS tablet Take 1 tablet (90 mg total) by mouth 2 (two) times daily. 60 tablet 3   No current facility-administered medications for this visit.    Allergies:   Plavix [clopidogrel bisulfate], Codeine, and Tylenol with codeine #3 [  acetaminophen-codeine]    Social History:  The patient  reports that she quit smoking about 8 years ago. Her smoking use included cigarettes. She has a 10.00 pack-year smoking history. She has never used smokeless tobacco. She reports that she does not drink alcohol and does not use drugs.   Family History:  The patient's family history includes Cancer in her brother and mother; Diabetes in her father; Heart attack in her maternal grandfather and maternal grandmother.    ROS: All other systems are reviewed and negative. Unless otherwise mentioned in H&P    PHYSICAL EXAM: VS:  There were no vitals taken for this visit. , BMI There is no height or weight on file to calculate BMI. GEN: Well nourished, well developed, in no acute distress HEENT: normal Neck: no JVD, carotid bruits, or masses Cardiac: ***RRR; no murmurs, rubs, or gallops,no edema  Respiratory:  Clear to auscultation bilaterally, normal work of breathing GI: soft, nontender, nondistended, + BS MS: no deformity or atrophy Skin: warm and dry, no rash Neuro:  Strength and sensation are intact Psych: euthymic mood, full affect   EKG:  EKG {ACTION; IS/IS [id number]} ordered today. The ekg ordered today demonstrates  ***   Recent Labs: 03/17/2020: BUN 34; Creatinine, Ser 4.60; Hemoglobin 10.9; Potassium 4.6; Sodium 136    Lipid Panel    Component Value Date/Time   CHOL 162 09/15/2018 0522   TRIG 51 09/15/2018 0522   HDL 45 09/15/2018 0522   CHOLHDL 3.6 09/15/2018 0522   VLDL 10 09/15/2018 0522   LDLCALC 107 (H) 09/15/2018 0522      Wt Readings from Last 3 Encounters:  03/17/20 153 lb (69.4 kg)  03/03/20 155 lb (70.3 kg)  02/11/20 153 lb (69.4 kg)      Other studies Reviewed: 2016 cardiac cath  Prox LAD to Mid LAD lesion, 90% stenosed.  Ost Cx to Mid Cx lesion, 95% stenosed.  Ost 2nd Mrg to 2nd Mrg lesion, 80% stenosed.  Mid Cx lesion, 90% stenosed.  Prox RCA lesion, 60% stenosed.  Mid RCA lesion, 50% stenosed.  Diagnostic Dominance: Right   ECHO 08/01/18 Study Conclusions   - Left ventricle: The cavity size was normal. There was mild focal  basal hypertrophy of the septum. Systolic function was mildly  reduced. The estimated ejection fraction was in the range of 45%  to 50%. Hypokinesis of the apicalinferolateral, inferior,  inferoseptal, and apical myocardium.  - Aortic valve: Trileaflet; mildly thickened, mildly calcified  leaflets. Left coronary cusp mobility was moderately restricted.  - Mitral valve: Calcified annulus. Mildly thickened leaflets .  There was trivial regurgitation.  - Right ventricle: The cavity size was mildly dilated. Wall  thickness was normal. Systolic function was low normal.  - Atrial septum: No defect or patent foramen ovale was identified.  - Tricuspid valve: There was no significant regurgitation.  - Pulmonic valve: There was trivial regurgitation.     ASSESSMENT AND PLAN:  1.  ***   Current medicines are reviewed at length with the patient today.  I have spent *** dedicated to the care of this patient on the date of this encounter to include pre-visit review of records, assessment, management and diagnostic  testing,with shared decision making.  Labs/ tests ordered today include: *** Phill Myron. West Pugh, ANP, AACC   03/30/2020 8:59 AM    [address] [place] [address] Office [phone] Fax [phone]  Notice: This dictation was prepared with Dragon dictation along with smaller  Company secretary. Any transcriptional errors that result from this process are unintentional and may not be corrected upon review.

## 2020-03-31 ENCOUNTER — Ambulatory Visit: Payer: Medicare Other | Admitting: Adult Health

## 2020-03-31 DIAGNOSIS — I70229 Atherosclerosis of native arteries of extremities with rest pain, unspecified extremity: Secondary | ICD-10-CM | POA: Diagnosis not present

## 2020-03-31 DIAGNOSIS — Z20822 Contact with and (suspected) exposure to covid-19: Secondary | ICD-10-CM | POA: Diagnosis not present

## 2020-03-31 DIAGNOSIS — Z01812 Encounter for preprocedural laboratory examination: Secondary | ICD-10-CM | POA: Diagnosis not present

## 2020-04-01 DIAGNOSIS — D631 Anemia in chronic kidney disease: Secondary | ICD-10-CM | POA: Diagnosis not present

## 2020-04-01 DIAGNOSIS — N2581 Secondary hyperparathyroidism of renal origin: Secondary | ICD-10-CM | POA: Diagnosis not present

## 2020-04-01 DIAGNOSIS — E1121 Type 2 diabetes mellitus with diabetic nephropathy: Secondary | ICD-10-CM | POA: Diagnosis not present

## 2020-04-01 DIAGNOSIS — L03032 Cellulitis of left toe: Secondary | ICD-10-CM | POA: Diagnosis not present

## 2020-04-01 DIAGNOSIS — E785 Hyperlipidemia, unspecified: Secondary | ICD-10-CM | POA: Diagnosis not present

## 2020-04-01 DIAGNOSIS — N186 End stage renal disease: Secondary | ICD-10-CM | POA: Diagnosis not present

## 2020-04-03 DIAGNOSIS — N2581 Secondary hyperparathyroidism of renal origin: Secondary | ICD-10-CM | POA: Diagnosis not present

## 2020-04-03 DIAGNOSIS — E1121 Type 2 diabetes mellitus with diabetic nephropathy: Secondary | ICD-10-CM | POA: Diagnosis not present

## 2020-04-03 DIAGNOSIS — N186 End stage renal disease: Secondary | ICD-10-CM | POA: Diagnosis not present

## 2020-04-03 DIAGNOSIS — D631 Anemia in chronic kidney disease: Secondary | ICD-10-CM | POA: Diagnosis not present

## 2020-04-03 DIAGNOSIS — L03032 Cellulitis of left toe: Secondary | ICD-10-CM | POA: Diagnosis not present

## 2020-04-03 DIAGNOSIS — E785 Hyperlipidemia, unspecified: Secondary | ICD-10-CM | POA: Diagnosis not present

## 2020-04-06 DIAGNOSIS — D631 Anemia in chronic kidney disease: Secondary | ICD-10-CM | POA: Diagnosis not present

## 2020-04-06 DIAGNOSIS — L03032 Cellulitis of left toe: Secondary | ICD-10-CM | POA: Diagnosis not present

## 2020-04-06 DIAGNOSIS — E1121 Type 2 diabetes mellitus with diabetic nephropathy: Secondary | ICD-10-CM | POA: Diagnosis not present

## 2020-04-06 DIAGNOSIS — N2581 Secondary hyperparathyroidism of renal origin: Secondary | ICD-10-CM | POA: Diagnosis not present

## 2020-04-06 DIAGNOSIS — N186 End stage renal disease: Secondary | ICD-10-CM | POA: Diagnosis not present

## 2020-04-06 DIAGNOSIS — E785 Hyperlipidemia, unspecified: Secondary | ICD-10-CM | POA: Diagnosis not present

## 2020-04-08 DIAGNOSIS — E1121 Type 2 diabetes mellitus with diabetic nephropathy: Secondary | ICD-10-CM | POA: Diagnosis not present

## 2020-04-08 DIAGNOSIS — E785 Hyperlipidemia, unspecified: Secondary | ICD-10-CM | POA: Diagnosis not present

## 2020-04-08 DIAGNOSIS — N186 End stage renal disease: Secondary | ICD-10-CM | POA: Diagnosis not present

## 2020-04-08 DIAGNOSIS — L03032 Cellulitis of left toe: Secondary | ICD-10-CM | POA: Diagnosis not present

## 2020-04-08 DIAGNOSIS — N2581 Secondary hyperparathyroidism of renal origin: Secondary | ICD-10-CM | POA: Diagnosis not present

## 2020-04-08 DIAGNOSIS — D631 Anemia in chronic kidney disease: Secondary | ICD-10-CM | POA: Diagnosis not present

## 2020-04-09 ENCOUNTER — Other Ambulatory Visit: Payer: Self-pay

## 2020-04-09 DIAGNOSIS — I739 Peripheral vascular disease, unspecified: Secondary | ICD-10-CM

## 2020-04-10 DIAGNOSIS — N186 End stage renal disease: Secondary | ICD-10-CM | POA: Diagnosis not present

## 2020-04-10 DIAGNOSIS — L03032 Cellulitis of left toe: Secondary | ICD-10-CM | POA: Diagnosis not present

## 2020-04-10 DIAGNOSIS — E785 Hyperlipidemia, unspecified: Secondary | ICD-10-CM | POA: Diagnosis not present

## 2020-04-10 DIAGNOSIS — D631 Anemia in chronic kidney disease: Secondary | ICD-10-CM | POA: Diagnosis not present

## 2020-04-10 DIAGNOSIS — E1121 Type 2 diabetes mellitus with diabetic nephropathy: Secondary | ICD-10-CM | POA: Diagnosis not present

## 2020-04-10 DIAGNOSIS — N2581 Secondary hyperparathyroidism of renal origin: Secondary | ICD-10-CM | POA: Diagnosis not present

## 2020-04-13 DIAGNOSIS — N186 End stage renal disease: Secondary | ICD-10-CM | POA: Diagnosis not present

## 2020-04-13 DIAGNOSIS — D631 Anemia in chronic kidney disease: Secondary | ICD-10-CM | POA: Diagnosis not present

## 2020-04-13 DIAGNOSIS — E785 Hyperlipidemia, unspecified: Secondary | ICD-10-CM | POA: Diagnosis not present

## 2020-04-13 DIAGNOSIS — N2581 Secondary hyperparathyroidism of renal origin: Secondary | ICD-10-CM | POA: Diagnosis not present

## 2020-04-13 DIAGNOSIS — E1121 Type 2 diabetes mellitus with diabetic nephropathy: Secondary | ICD-10-CM | POA: Diagnosis not present

## 2020-04-13 DIAGNOSIS — L03032 Cellulitis of left toe: Secondary | ICD-10-CM | POA: Diagnosis not present

## 2020-04-15 DIAGNOSIS — E1121 Type 2 diabetes mellitus with diabetic nephropathy: Secondary | ICD-10-CM | POA: Diagnosis not present

## 2020-04-15 DIAGNOSIS — L03032 Cellulitis of left toe: Secondary | ICD-10-CM | POA: Diagnosis not present

## 2020-04-15 DIAGNOSIS — N186 End stage renal disease: Secondary | ICD-10-CM | POA: Diagnosis not present

## 2020-04-15 DIAGNOSIS — D631 Anemia in chronic kidney disease: Secondary | ICD-10-CM | POA: Diagnosis not present

## 2020-04-15 DIAGNOSIS — E785 Hyperlipidemia, unspecified: Secondary | ICD-10-CM | POA: Diagnosis not present

## 2020-04-15 DIAGNOSIS — N2581 Secondary hyperparathyroidism of renal origin: Secondary | ICD-10-CM | POA: Diagnosis not present

## 2020-04-17 DIAGNOSIS — D631 Anemia in chronic kidney disease: Secondary | ICD-10-CM | POA: Diagnosis not present

## 2020-04-17 DIAGNOSIS — E1121 Type 2 diabetes mellitus with diabetic nephropathy: Secondary | ICD-10-CM | POA: Diagnosis not present

## 2020-04-17 DIAGNOSIS — E785 Hyperlipidemia, unspecified: Secondary | ICD-10-CM | POA: Diagnosis not present

## 2020-04-17 DIAGNOSIS — L03032 Cellulitis of left toe: Secondary | ICD-10-CM | POA: Diagnosis not present

## 2020-04-17 DIAGNOSIS — N186 End stage renal disease: Secondary | ICD-10-CM | POA: Diagnosis not present

## 2020-04-17 DIAGNOSIS — N2581 Secondary hyperparathyroidism of renal origin: Secondary | ICD-10-CM | POA: Diagnosis not present

## 2020-04-18 DIAGNOSIS — N186 End stage renal disease: Secondary | ICD-10-CM | POA: Diagnosis not present

## 2020-04-18 DIAGNOSIS — Z992 Dependence on renal dialysis: Secondary | ICD-10-CM | POA: Diagnosis not present

## 2020-04-20 ENCOUNTER — Encounter (HOSPITAL_COMMUNITY): Payer: Medicare Other

## 2020-04-20 ENCOUNTER — Ambulatory Visit (HOSPITAL_COMMUNITY): Payer: Medicare Other

## 2020-04-20 DIAGNOSIS — N186 End stage renal disease: Secondary | ICD-10-CM | POA: Diagnosis not present

## 2020-04-20 DIAGNOSIS — D509 Iron deficiency anemia, unspecified: Secondary | ICD-10-CM | POA: Diagnosis not present

## 2020-04-20 DIAGNOSIS — N2581 Secondary hyperparathyroidism of renal origin: Secondary | ICD-10-CM | POA: Diagnosis not present

## 2020-04-20 DIAGNOSIS — D631 Anemia in chronic kidney disease: Secondary | ICD-10-CM | POA: Diagnosis not present

## 2020-04-20 DIAGNOSIS — E1121 Type 2 diabetes mellitus with diabetic nephropathy: Secondary | ICD-10-CM | POA: Diagnosis not present

## 2020-04-21 ENCOUNTER — Other Ambulatory Visit: Payer: Self-pay

## 2020-04-21 ENCOUNTER — Ambulatory Visit (INDEPENDENT_AMBULATORY_CARE_PROVIDER_SITE_OTHER)
Admission: RE | Admit: 2020-04-21 | Discharge: 2020-04-21 | Disposition: A | Discharge status: Home / Self Care | Payer: Medicare Other | Source: Ambulatory Visit | Attending: Surgery | Admitting: Surgery

## 2020-04-21 ENCOUNTER — Ambulatory Visit (HOSPITAL_COMMUNITY)
Admission: RE | Admit: 2020-04-21 | Discharge: 2020-04-21 | Disposition: A | Discharge status: Home / Self Care | Payer: Medicare Other | Source: Ambulatory Visit | Attending: Surgery | Admitting: Surgery

## 2020-04-21 ENCOUNTER — Ambulatory Visit (INDEPENDENT_AMBULATORY_CARE_PROVIDER_SITE_OTHER): Payer: Medicare Other | Admitting: Physician Assistant

## 2020-04-21 VITALS — BP 175/78 | HR 81 | Temp 97.2°F | Resp 20 | Ht 64.0 in | Wt 153.0 lb

## 2020-04-21 DIAGNOSIS — I739 Peripheral vascular disease, unspecified: Secondary | ICD-10-CM

## 2020-04-21 DIAGNOSIS — I779 Disorder of arteries and arterioles, unspecified: Secondary | ICD-10-CM | POA: Diagnosis not present

## 2020-04-21 NOTE — Progress Notes (Signed)
HISTORY AND PHYSICAL     CC:  follow up. Requesting Provider:  [name], MD  HPI: This is a 69 y.o. female who is here today for follow up for PAD.  I last saw her in mid June for follow up for failed angioplasty on 02/11/2020 by Dr. [name] that was done for a left toe ulcer that occurred around mid May from running over her foot with a wheelchair.  Her ABI was non compressible with a TBI of 0.17.  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  Pt has ESRD and dialyzes in [facility] on M/W/F.  Since then, he underwent  Procedure Performed:             1.  Ultrasound-guided access, right femoral artery             2.  Stent, left superficial femoral and popliteal artery             3.  Conscious sedation, 87 minutes             4.  Closure device, Mynx On 03/17/2020 by Dr. [name].    The pt returns today for follow up.  She states that her left foot feels much better and her wound has healed.  She states that her left foot swells but is better in the mornings when she wakes.   The pt is on a statin for cholesterol management.    The pt is on an aspirin.    Other AC:  Brilinta The pt is on BB for hypertension.  The pt does have diabetes. Tobacco hx:  Former-quit 2013   Past Medical History:  Diagnosis Date   Anemia    Anxiety    takes Xanax prn   Arthritis    Cataract    left eye   CKD (chronic kidney disease), stage IV (HCC)    Coronary artery disease 2016   cath w/ 90% LAD, 95%CFX, 80% OM 2, 60% RCA, not CABG candidate, rx medically   Full dentures    GERD (gastroesophageal reflux disease)    H/O hiatal hernia    Headache(784.0)    when b/p is elevated   Hemorrhoids    Hx of AKA (above knee amputation), right (HCC)    Hyperlipidemia    takes Simvastatin daily   Hypertension    takes Amlodipine/HCTZ/Losartan daily   Migraines    Myocardial infarction ([place]) 1990's    NSTEMI (non-ST elevated myocardial infarction) ([place])    Peripheral vascular disease (HCC)    PONV (postoperative nausea and vomiting)    Stroke ([place])    TIA history   Type II diabetes mellitus ([place])    on Lantus   Vertigo    takes Ativert prn    Past Surgical History:  Procedure Laterality Date   ABDOMINAL AORTAGRAM N/A 02/29/2012   Procedure: ABDOMINAL Maxcine Ham;  Surgeon: [name], MD;  Location[facility] LAB;  Service: Cardiovascular;  Laterality: N/A;   ABDOMINAL AORTOGRAM W/LOWER EXTREMITY N/A 02/11/2020   Procedure: ABDOMINAL AORTOGRAM W/LOWER EXTREMITY;  Surgeon: [name], MD;  Location: [place] CV LAB;  Service: Cardiovascular;  Laterality: N/A;   ABDOMINAL AORTOGRAM W/LOWER EXTREMITY N/A 03/17/2020   Procedure: ABDOMINAL AORTOGRAM W/LOWER EXTREMITY;  Surgeon: [name], MD;  Location: [place] CV LAB;  Service: Cardiovascular;  Laterality: N/A;   AMPUTATION  05/10/2012   Procedure: AMPUTATION ABOVE KNEE;  Surgeon: Durene Fruits  Pierre Bali, MD;  Location: [place];  Service: Vascular;  Laterality: Right;   open right groin wound noted   aortogram  02/2012   AV FISTULA PLACEMENT Right 03/04/2019   Procedure: ARTERIOVENOUS (AV) FISTULA CREATION;  Surgeon: Rosetta Posner, MD;  Location: [place];  Service: Vascular;  Laterality: Right;   CARDIAC CATHETERIZATION N/A 04/13/2015   Procedure: Right/Left Heart Cath and Coronary Angiography;  Surgeon: [name], MD;  Location: [place] CV LAB;  Service: Cardiovascular;  Laterality: N/A;   CLEFT PALATE REPAIR     several   COLONOSCOPY     DILATION AND CURETTAGE OF UTERUS     FEMORAL-POPLITEAL BYPASS GRAFT  04/05/2012   Procedure: BYPASS GRAFT FEMORAL-POPLITEAL ARTERY;  Surgeon: [name], MD;  Location: [place];  Service: Vascular;  Laterality: Right;  REVISION   FEMORAL-TIBIAL BYPASS GRAFT  03/29/2012   Procedure: BYPASS GRAFT FEMORAL-TIBIAL ARTERY;  Surgeon: [name], MD;  Location: [facility] OR;   Service: Vascular;  Laterality: Right;  Right femoral to Posterior Tibialis with composite graft of 76mm x 80 cm ringed gortex graft and saphenous vein  ,intraoperative arteriogram   FEMOROPOPLITEAL THROMBECTOMY / EMBOLECTOMY  05/09/2012   IR FLUORO GUIDE CV LINE RIGHT  03/01/2019   IR US GUIDE VASC ACCESS RIGHT  03/01/2019   MULTIPLE TOOTH EXTRACTIONS     MYOMECTOMY     PERIPHERAL VASCULAR INTERVENTION Left 03/17/2020   Procedure: PERIPHERAL VASCULAR INTERVENTION;  Surgeon: [name], MD;  Location: [place] CV LAB;  Service: Cardiovascular;  Laterality: Left;  popliteal   PR VEIN BYPASS GRAFT,AORTO-FEM-POP  05/27/11   Right SFA-Below knee Pop BP   REVISION OF ARTERIOVENOUS GORETEX GRAFT Right 08/21/2019   Procedure: REVISION OF ARTERIOVENOUS FISTULA RIGHT ARM;  Surgeon: Rosetta Posner, MD;  Location: [facility] OR;  Service: Vascular;  Laterality: Right;   TEE WITHOUT CARDIOVERSION N/A 01/03/2017   Procedure: TRANSESOPHAGEAL ECHOCARDIOGRAM (TEE);  Surgeon: Skeet Latch, MD;  Location: [facility] ENDOSCOPY;  Service: Cardiovascular;  Laterality: N/A;   THORACENTESIS  02/27/2019       TONSILLECTOMY     TUBAL LIGATION  ~ 1990    Allergies  Allergen Reactions   Plavix [Clopidogrel Bisulfate] Itching   Codeine Other (See Comments)    "makes me feel strange"   Tylenol With Codeine #3 [Acetaminophen-Codeine] Other (See Comments)    "doesn't make me feel right"    Current Outpatient Medications  Medication Sig Dispense Refill   acetaminophen (TYLENOL) 500 MG tablet Take 500 mg by mouth every 6 (six) hours as needed for mild pain.     ALPRAZolam (XANAX) 0.25 MG tablet Take 1 tablet (0.25 mg total) by mouth 3 (three) times daily as needed for anxiety. 12 tablet 0   aspirin 81 MG chewable tablet Chew 81 mg by mouth daily.     atorvastatin (LIPITOR) 80 MG tablet Take 80 mg by mouth daily.      carvedilol (COREG) 25 MG tablet Take 25 mg by mouth daily.      cholecalciferol (VITAMIN  D) 25 MCG (1000 UT) tablet Take 1,000 Units by mouth daily.     docusate sodium (COLACE) 100 MG capsule Take 100 mg by mouth daily as needed for mild constipation.     insulin glargine (LANTUS) 100 UNIT/ML injection Inject 0.2 mLs (20 Units total) into the skin at bedtime. (Patient taking differently: Inject 5 Units into the skin at bedtime. ) 10 mL 11   nitroGLYCERIN (NITROSTAT) 0.4 MG  SL tablet Place 1 tablet (0.4 mg total) under the tongue every 5 (five) minutes x 3 doses as needed for chest pain. 25 tablet prn   RENVELA 800 MG tablet Take 800 mg by mouth 3 (three) times daily with meals.      ticagrelor (BRILINTA) 90 MG TABS tablet Take 1 tablet (90 mg total) by mouth 2 (two) times daily. 60 tablet 3   No current facility-administered medications for this visit.    Family History  Problem Relation Age of Onset   Cancer Mother        breast   Diabetes Father    Cancer Brother    Heart attack Maternal Grandmother    Heart attack Maternal Grandfather     Social History   Socioeconomic History   Marital status: Single    Spouse name: Not on file   Number of children: Not on file   Years of education: Not on file   Highest education level: Not on file  Occupational History   Occupation: disabled  Tobacco Use   Smoking status: Former Smoker    Packs/day: 0.25    Years: 40.00    Pack years: 10.00    Types: Cigarettes    Quit date: 02/27/2012    Years since quitting: 8.1   Smokeless tobacco: Never Used  Vaping Use   Vaping Use: Never used  Substance and Sexual Activity   Alcohol use: No   Drug use: No   Sexual activity: Never    Birth control/protection: Post-menopausal  Other Topics Concern   Not on file  Social History Narrative   She lives in [place] with family.   Social Determinants of Health   Financial Resource Strain:    Difficulty of Paying Living Expenses:   Food Insecurity:    Worried About Charity fundraiser in the Last Year:      Arboriculturist in the Last Year:   Transportation Needs:    Film/video editor (Medical):    Lack of Transportation (Non-Medical):   Physical Activity:    Days of Exercise per Week:    Minutes of Exercise per Session:   Stress:    Feeling of Stress :   Social Connections:    Frequency of Communication with Friends and Family:    Frequency of Social Gatherings with Friends and Family:    Attends Religious Services:    Active Member of Clubs or Organizations:    Attends Music therapist:    Marital Status:   Intimate Partner Violence:    Fear of Current or Ex-Partner:    Emotionally Abused:    Physically Abused:    Sexually Abused:      REVIEW OF SYSTEMS:   [X]  denotes positive finding, [ ]  denotes negative finding Cardiac  Comments:  Chest pain or chest pressure:    Shortness of breath upon exertion:    Short of breath when lying flat:    Irregular heart rhythm:        Vascular    Pain in calf, thigh, or hip brought on by ambulation:    Pain in feet at night that wakes you up from your sleep:     Blood clot in your veins:    Leg swelling:         Pulmonary    Oxygen at home:    Productive cough:     Wheezing:         Neurologic    Sudden  weakness in arms or legs:     Sudden numbness in arms or legs:     Sudden onset of difficulty speaking or slurred speech:    Temporary loss of vision in one eye:     Problems with dizziness:         Gastrointestinal    Blood in stool:     Vomited blood:         Genitourinary    Burning when urinating:     Blood in urine:        Psychiatric    Major depression:         Hematologic    Bleeding problems:    Problems with blood clotting too easily:        Skin    Rashes or ulcers:        Constitutional    Fever or chills:      PHYSICAL EXAMINATION:  Today's Vitals   04/21/20 1518  BP: (!) 175/78  Pulse: 81  Resp: 20  Temp: (!) 97.2 F (36.2 C)  TempSrc: Temporal  SpO2:  100%  Weight: 153 lb (69.4 kg)  Height: 5\' 4"  (1.626 m)   Body mass index is 26.26 kg/m.   General:  WDWN in NAD; vital signs documented above Gait: Not observed-in wheelchair. HENT: WNL, normocephalic Pulmonary: normal non-labored breathing , without wheezing Cardiac: regular HR, without  Murmur; without carotid bruits Abdomen: soft, NT, no masses Skin: without rashes Vascular Exam/Pulses:  Right Left  Radial 2+ (normal) 2+ (normal)  DP AKA Monophasic doppler  PT AKA Brisk doppler  Peroneal AKA Monophasic doppler   Extremities: Excellent thrill in RUA dialysis access.  Right AKA looks good.        Musculoskeletal: no muscle wasting or atrophy  Neurologic: A&O X 3;  No focal weakness or paresthesias are detected Psychiatric:  The pt has Normal affect.   Non-Invasive Vascular Imaging:   ABI's/TBI's on 04/21/2020: Right:  AKA  Left:  [place]/0.54 - Great toe pressure: 100  Arterial duplex on 04/21/2020: +-----------+--------+-----+---------------+--------+--------+   LEFT     PSV cm/s Ratio Stenosis     Waveform Comments   +-----------+--------+-----+---------------+--------+--------+   CFA Prox   82                 biphasic        +-----------+--------+-----+---------------+--------+--------+   DFA     73                 biphasic        +-----------+--------+-----+---------------+--------+--------+   SFA Prox   81                 biphasic        +-----------+--------+-----+---------------+--------+--------+   SFA Mid   161        30-49% stenosis biphasic        +-----------+--------+-----+---------------+--------+--------+   PTA Distal  32                 biphasic        +-----------+--------+-----+---------------+--------+--------+   PERO Distal 38                 biphasic        +-----------+--------+-----+---------------+--------+--------+     Left Stent(s):    +---------------------+--------+--------+--------+--------+   SFA distal/ Popliteal PSV cm/s Stenosis Waveform Comments   +---------------------+--------+--------+--------+--------+   Prox to Stent     54         biphasic        +---------------------+--------+--------+--------+--------+  Proximal Stent     45         biphasic        +---------------------+--------+--------+--------+--------+   Mid Stent       77         biphasic        +---------------------+--------+--------+--------+--------+   Distal Stent      53         biphasic        +---------------------+--------+--------+--------+--------+   Distal to Stent    63         biphasic        +---------------------+--------+--------+--------+--------+   Summary:  Left: 30-49% stenosis noted in the superficial femoral artery. Patent  stent with no evidence of stenosis in the SFA and popliteal artery.   Previous ABI's/TBI's on 02/07/2020: Right:  AKA  Left:  [place]/0.17 - Great toe pressure:  31  Previous arterial duplex on 02/07/2020: +-----------+--------+-----+---------------+----------+--------------------  ----+   LEFT     PSV cm/s Ratio Stenosis     Waveform  Comments            +-----------+--------+-----+---------------+----------+--------------------  ----+   CFA Prox   69                 triphasic                  +-----------+--------+-----+---------------+----------+--------------------  ----+   CFA Mid   75                 triphasic                  +-----------+--------+-----+---------------+----------+--------------------  ----+   CFA Distal  54                 triphasic                  +-----------+--------+-----+---------------+----------+--------------------  ----+   DFA     85                 triphasic                   +-----------+--------+-----+---------------+----------+--------------------  ----+   SFA Prox   65                 triphasic                  +-----------+--------+-----+---------------+----------+--------------------  ----+   SFA Mid   179        30-49% stenosis triphasic  With PST            +-----------+--------+-----+---------------+----------+--------------------  ----+   SFA Distal                          Unable to obtain  flow;                                    collaterals noted       +-----------+--------+-----+---------------+----------+--------------------  ----+   POP Prox   42                 monophasic                 +-----------+--------+-----+---------------+----------+--------------------  ----+   POP Mid   307        50-74% stenosis monophasic                 +-----------+--------+-----+---------------+----------+--------------------  ----+  POP Distal  21                 monophasic Collaterals noted       +-----------+--------+-----+---------------+----------+--------------------  ----+   ATA Distal  18                 monophasic                 +-----------+--------+-----+---------------+----------+--------------------  ----+   PTA Prox                           Not definately                                         visualized           +-----------+--------+-----+---------------+----------+--------------------  ----+   PTA Mid   24                 monophasic                 +-----------+--------+-----+---------------+----------+--------------------  ----+   PTA Distal  10                 monophasic                  +-----------+--------+-----+---------------+----------+--------------------  ----+   PERO Prox                          Not definately                                         visualized           +-----------+--------+-----+---------------+----------+--------------------  ----+   PERO Mid                           Not definately                                         visualized           +-----------+--------+-----+---------------+----------+--------------------  ----+   PERO Distal 37                 monophasic                 +-----------+--------+-----+---------------+----------+--------------------  ----+   Summary:  Left:  30-49% stenosis noted in the mid superficial femoral artery.  50-74% stenosis noted in the popliteal artery.  Probable short segment occlusion of the distal SFA artery with  reconstitution at the proximal popliteal artery via collateralization.  Limited visualization due to deep vessel location.   ASSESSMENT/PLAN:: 69 y.o. female here for follow up for stenting of left SFA and popliteal artery for non healing wound left foot.   -pt's pain much improved and wound on left great toe has healed.  Her left great toe nail should fall off by itself.  The callus on the lateral aspect of the foot remains but essentially unchanged.  -she has a brisk left PT doppler signal.  Her TBI has improved.  She does have a 30-49% stenosis in the mid SFA.  There is  no evidence of stenosis in the stent of SFA and popliteal artery & waveforms are biphasic -continue her asa/statin/brilinta -pt will f/u in 6 months with ABI and LLE arterial duplex with PA given she has dialysis M/W/F and cannot come on Dr. Stephens Shire clinic day. -she will call sooner if she has any issues before then.     Leontine Locket, [facility] [phone]  Clinic MD:   Early

## 2020-04-22 DIAGNOSIS — D631 Anemia in chronic kidney disease: Secondary | ICD-10-CM | POA: Diagnosis not present

## 2020-04-22 DIAGNOSIS — N2581 Secondary hyperparathyroidism of renal origin: Secondary | ICD-10-CM | POA: Diagnosis not present

## 2020-04-22 DIAGNOSIS — N186 End stage renal disease: Secondary | ICD-10-CM | POA: Diagnosis not present

## 2020-04-22 DIAGNOSIS — D509 Iron deficiency anemia, unspecified: Secondary | ICD-10-CM | POA: Diagnosis not present

## 2020-04-22 DIAGNOSIS — E1121 Type 2 diabetes mellitus with diabetic nephropathy: Secondary | ICD-10-CM | POA: Diagnosis not present

## 2020-04-23 ENCOUNTER — Other Ambulatory Visit: Payer: Self-pay | Admitting: *Deleted

## 2020-04-23 DIAGNOSIS — I739 Peripheral vascular disease, unspecified: Secondary | ICD-10-CM

## 2020-04-23 DIAGNOSIS — I779 Disorder of arteries and arterioles, unspecified: Secondary | ICD-10-CM

## 2020-04-24 DIAGNOSIS — D509 Iron deficiency anemia, unspecified: Secondary | ICD-10-CM | POA: Diagnosis not present

## 2020-04-24 DIAGNOSIS — N186 End stage renal disease: Secondary | ICD-10-CM | POA: Diagnosis not present

## 2020-04-24 DIAGNOSIS — D631 Anemia in chronic kidney disease: Secondary | ICD-10-CM | POA: Diagnosis not present

## 2020-04-24 DIAGNOSIS — N2581 Secondary hyperparathyroidism of renal origin: Secondary | ICD-10-CM | POA: Diagnosis not present

## 2020-04-24 DIAGNOSIS — E1121 Type 2 diabetes mellitus with diabetic nephropathy: Secondary | ICD-10-CM | POA: Diagnosis not present

## 2020-04-27 DIAGNOSIS — D631 Anemia in chronic kidney disease: Secondary | ICD-10-CM | POA: Diagnosis not present

## 2020-04-27 DIAGNOSIS — E1121 Type 2 diabetes mellitus with diabetic nephropathy: Secondary | ICD-10-CM | POA: Diagnosis not present

## 2020-04-27 DIAGNOSIS — N186 End stage renal disease: Secondary | ICD-10-CM | POA: Diagnosis not present

## 2020-04-27 DIAGNOSIS — D509 Iron deficiency anemia, unspecified: Secondary | ICD-10-CM | POA: Diagnosis not present

## 2020-04-27 DIAGNOSIS — N2581 Secondary hyperparathyroidism of renal origin: Secondary | ICD-10-CM | POA: Diagnosis not present

## 2020-04-29 DIAGNOSIS — E1121 Type 2 diabetes mellitus with diabetic nephropathy: Secondary | ICD-10-CM | POA: Diagnosis not present

## 2020-04-29 DIAGNOSIS — N2581 Secondary hyperparathyroidism of renal origin: Secondary | ICD-10-CM | POA: Diagnosis not present

## 2020-04-29 DIAGNOSIS — D509 Iron deficiency anemia, unspecified: Secondary | ICD-10-CM | POA: Diagnosis not present

## 2020-04-29 DIAGNOSIS — D631 Anemia in chronic kidney disease: Secondary | ICD-10-CM | POA: Diagnosis not present

## 2020-04-29 DIAGNOSIS — N186 End stage renal disease: Secondary | ICD-10-CM | POA: Diagnosis not present

## 2020-05-01 DIAGNOSIS — E1121 Type 2 diabetes mellitus with diabetic nephropathy: Secondary | ICD-10-CM | POA: Diagnosis not present

## 2020-05-01 DIAGNOSIS — D631 Anemia in chronic kidney disease: Secondary | ICD-10-CM | POA: Diagnosis not present

## 2020-05-01 DIAGNOSIS — D509 Iron deficiency anemia, unspecified: Secondary | ICD-10-CM | POA: Diagnosis not present

## 2020-05-01 DIAGNOSIS — N186 End stage renal disease: Secondary | ICD-10-CM | POA: Diagnosis not present

## 2020-05-01 DIAGNOSIS — N2581 Secondary hyperparathyroidism of renal origin: Secondary | ICD-10-CM | POA: Diagnosis not present

## 2020-05-04 DIAGNOSIS — N2581 Secondary hyperparathyroidism of renal origin: Secondary | ICD-10-CM | POA: Diagnosis not present

## 2020-05-04 DIAGNOSIS — N186 End stage renal disease: Secondary | ICD-10-CM | POA: Diagnosis not present

## 2020-05-04 DIAGNOSIS — D631 Anemia in chronic kidney disease: Secondary | ICD-10-CM | POA: Diagnosis not present

## 2020-05-04 DIAGNOSIS — E1121 Type 2 diabetes mellitus with diabetic nephropathy: Secondary | ICD-10-CM | POA: Diagnosis not present

## 2020-05-04 DIAGNOSIS — D509 Iron deficiency anemia, unspecified: Secondary | ICD-10-CM | POA: Diagnosis not present

## 2020-05-06 DIAGNOSIS — D509 Iron deficiency anemia, unspecified: Secondary | ICD-10-CM | POA: Diagnosis not present

## 2020-05-06 DIAGNOSIS — N2581 Secondary hyperparathyroidism of renal origin: Secondary | ICD-10-CM | POA: Diagnosis not present

## 2020-05-06 DIAGNOSIS — E1121 Type 2 diabetes mellitus with diabetic nephropathy: Secondary | ICD-10-CM | POA: Diagnosis not present

## 2020-05-06 DIAGNOSIS — D631 Anemia in chronic kidney disease: Secondary | ICD-10-CM | POA: Diagnosis not present

## 2020-05-06 DIAGNOSIS — N186 End stage renal disease: Secondary | ICD-10-CM | POA: Diagnosis not present

## 2020-05-08 DIAGNOSIS — N2581 Secondary hyperparathyroidism of renal origin: Secondary | ICD-10-CM | POA: Diagnosis not present

## 2020-05-08 DIAGNOSIS — N186 End stage renal disease: Secondary | ICD-10-CM | POA: Diagnosis not present

## 2020-05-08 DIAGNOSIS — E1121 Type 2 diabetes mellitus with diabetic nephropathy: Secondary | ICD-10-CM | POA: Diagnosis not present

## 2020-05-08 DIAGNOSIS — D631 Anemia in chronic kidney disease: Secondary | ICD-10-CM | POA: Diagnosis not present

## 2020-05-08 DIAGNOSIS — D509 Iron deficiency anemia, unspecified: Secondary | ICD-10-CM | POA: Diagnosis not present

## 2020-05-11 DIAGNOSIS — D631 Anemia in chronic kidney disease: Secondary | ICD-10-CM | POA: Diagnosis not present

## 2020-05-11 DIAGNOSIS — E1121 Type 2 diabetes mellitus with diabetic nephropathy: Secondary | ICD-10-CM | POA: Diagnosis not present

## 2020-05-11 DIAGNOSIS — N186 End stage renal disease: Secondary | ICD-10-CM | POA: Diagnosis not present

## 2020-05-11 DIAGNOSIS — N2581 Secondary hyperparathyroidism of renal origin: Secondary | ICD-10-CM | POA: Diagnosis not present

## 2020-05-11 DIAGNOSIS — D509 Iron deficiency anemia, unspecified: Secondary | ICD-10-CM | POA: Diagnosis not present

## 2020-05-13 DIAGNOSIS — N186 End stage renal disease: Secondary | ICD-10-CM | POA: Diagnosis not present

## 2020-05-13 DIAGNOSIS — N2581 Secondary hyperparathyroidism of renal origin: Secondary | ICD-10-CM | POA: Diagnosis not present

## 2020-05-13 DIAGNOSIS — D509 Iron deficiency anemia, unspecified: Secondary | ICD-10-CM | POA: Diagnosis not present

## 2020-05-13 DIAGNOSIS — E1121 Type 2 diabetes mellitus with diabetic nephropathy: Secondary | ICD-10-CM | POA: Diagnosis not present

## 2020-05-13 DIAGNOSIS — D631 Anemia in chronic kidney disease: Secondary | ICD-10-CM | POA: Diagnosis not present

## 2020-05-15 DIAGNOSIS — D631 Anemia in chronic kidney disease: Secondary | ICD-10-CM | POA: Diagnosis not present

## 2020-05-15 DIAGNOSIS — N2581 Secondary hyperparathyroidism of renal origin: Secondary | ICD-10-CM | POA: Diagnosis not present

## 2020-05-15 DIAGNOSIS — E1121 Type 2 diabetes mellitus with diabetic nephropathy: Secondary | ICD-10-CM | POA: Diagnosis not present

## 2020-05-15 DIAGNOSIS — N186 End stage renal disease: Secondary | ICD-10-CM | POA: Diagnosis not present

## 2020-05-15 DIAGNOSIS — D509 Iron deficiency anemia, unspecified: Secondary | ICD-10-CM | POA: Diagnosis not present

## 2020-05-18 DIAGNOSIS — N2581 Secondary hyperparathyroidism of renal origin: Secondary | ICD-10-CM | POA: Diagnosis not present

## 2020-05-18 DIAGNOSIS — D631 Anemia in chronic kidney disease: Secondary | ICD-10-CM | POA: Diagnosis not present

## 2020-05-18 DIAGNOSIS — E1121 Type 2 diabetes mellitus with diabetic nephropathy: Secondary | ICD-10-CM | POA: Diagnosis not present

## 2020-05-18 DIAGNOSIS — D509 Iron deficiency anemia, unspecified: Secondary | ICD-10-CM | POA: Diagnosis not present

## 2020-05-18 DIAGNOSIS — N186 End stage renal disease: Secondary | ICD-10-CM | POA: Diagnosis not present

## 2020-05-19 DIAGNOSIS — N186 End stage renal disease: Secondary | ICD-10-CM | POA: Diagnosis not present

## 2020-05-19 DIAGNOSIS — Z992 Dependence on renal dialysis: Secondary | ICD-10-CM | POA: Diagnosis not present

## 2020-05-20 DIAGNOSIS — D509 Iron deficiency anemia, unspecified: Secondary | ICD-10-CM | POA: Diagnosis not present

## 2020-05-20 DIAGNOSIS — D899 Disorder involving the immune mechanism, unspecified: Secondary | ICD-10-CM | POA: Diagnosis not present

## 2020-05-20 DIAGNOSIS — N2581 Secondary hyperparathyroidism of renal origin: Secondary | ICD-10-CM | POA: Diagnosis not present

## 2020-05-20 DIAGNOSIS — E1121 Type 2 diabetes mellitus with diabetic nephropathy: Secondary | ICD-10-CM | POA: Diagnosis not present

## 2020-05-20 DIAGNOSIS — N186 End stage renal disease: Secondary | ICD-10-CM | POA: Diagnosis not present

## 2020-05-20 DIAGNOSIS — D631 Anemia in chronic kidney disease: Secondary | ICD-10-CM | POA: Diagnosis not present

## 2020-05-22 DIAGNOSIS — N2581 Secondary hyperparathyroidism of renal origin: Secondary | ICD-10-CM | POA: Diagnosis not present

## 2020-05-22 DIAGNOSIS — N186 End stage renal disease: Secondary | ICD-10-CM | POA: Diagnosis not present

## 2020-05-22 DIAGNOSIS — E1121 Type 2 diabetes mellitus with diabetic nephropathy: Secondary | ICD-10-CM | POA: Diagnosis not present

## 2020-05-22 DIAGNOSIS — D631 Anemia in chronic kidney disease: Secondary | ICD-10-CM | POA: Diagnosis not present

## 2020-05-22 DIAGNOSIS — D899 Disorder involving the immune mechanism, unspecified: Secondary | ICD-10-CM | POA: Diagnosis not present

## 2020-05-22 DIAGNOSIS — D509 Iron deficiency anemia, unspecified: Secondary | ICD-10-CM | POA: Diagnosis not present

## 2020-05-25 DIAGNOSIS — D509 Iron deficiency anemia, unspecified: Secondary | ICD-10-CM | POA: Diagnosis not present

## 2020-05-25 DIAGNOSIS — N186 End stage renal disease: Secondary | ICD-10-CM | POA: Diagnosis not present

## 2020-05-25 DIAGNOSIS — N2581 Secondary hyperparathyroidism of renal origin: Secondary | ICD-10-CM | POA: Diagnosis not present

## 2020-05-25 DIAGNOSIS — E1121 Type 2 diabetes mellitus with diabetic nephropathy: Secondary | ICD-10-CM | POA: Diagnosis not present

## 2020-05-25 DIAGNOSIS — D899 Disorder involving the immune mechanism, unspecified: Secondary | ICD-10-CM | POA: Diagnosis not present

## 2020-05-25 DIAGNOSIS — D631 Anemia in chronic kidney disease: Secondary | ICD-10-CM | POA: Diagnosis not present

## 2020-05-26 DIAGNOSIS — E559 Vitamin D deficiency, unspecified: Secondary | ICD-10-CM | POA: Diagnosis not present

## 2020-05-26 DIAGNOSIS — Z0001 Encounter for general adult medical examination with abnormal findings: Secondary | ICD-10-CM | POA: Diagnosis not present

## 2020-05-26 DIAGNOSIS — I119 Hypertensive heart disease without heart failure: Secondary | ICD-10-CM | POA: Diagnosis not present

## 2020-05-26 DIAGNOSIS — D638 Anemia in other chronic diseases classified elsewhere: Secondary | ICD-10-CM | POA: Diagnosis not present

## 2020-05-26 DIAGNOSIS — Z89611 Acquired absence of right leg above knee: Secondary | ICD-10-CM | POA: Diagnosis not present

## 2020-05-26 DIAGNOSIS — E538 Deficiency of other specified B group vitamins: Secondary | ICD-10-CM | POA: Diagnosis not present

## 2020-05-26 DIAGNOSIS — N184 Chronic kidney disease, stage 4 (severe): Secondary | ICD-10-CM | POA: Diagnosis not present

## 2020-05-26 DIAGNOSIS — I10 Essential (primary) hypertension: Secondary | ICD-10-CM | POA: Diagnosis not present

## 2020-05-26 DIAGNOSIS — E119 Type 2 diabetes mellitus without complications: Secondary | ICD-10-CM | POA: Diagnosis not present

## 2020-05-27 DIAGNOSIS — N2581 Secondary hyperparathyroidism of renal origin: Secondary | ICD-10-CM | POA: Diagnosis not present

## 2020-05-27 DIAGNOSIS — N186 End stage renal disease: Secondary | ICD-10-CM | POA: Diagnosis not present

## 2020-05-27 DIAGNOSIS — D509 Iron deficiency anemia, unspecified: Secondary | ICD-10-CM | POA: Diagnosis not present

## 2020-05-27 DIAGNOSIS — D631 Anemia in chronic kidney disease: Secondary | ICD-10-CM | POA: Diagnosis not present

## 2020-05-27 DIAGNOSIS — E1121 Type 2 diabetes mellitus with diabetic nephropathy: Secondary | ICD-10-CM | POA: Diagnosis not present

## 2020-05-27 DIAGNOSIS — D899 Disorder involving the immune mechanism, unspecified: Secondary | ICD-10-CM | POA: Diagnosis not present

## 2020-05-29 DIAGNOSIS — N186 End stage renal disease: Secondary | ICD-10-CM | POA: Diagnosis not present

## 2020-05-29 DIAGNOSIS — D509 Iron deficiency anemia, unspecified: Secondary | ICD-10-CM | POA: Diagnosis not present

## 2020-05-29 DIAGNOSIS — D899 Disorder involving the immune mechanism, unspecified: Secondary | ICD-10-CM | POA: Diagnosis not present

## 2020-05-29 DIAGNOSIS — E1121 Type 2 diabetes mellitus with diabetic nephropathy: Secondary | ICD-10-CM | POA: Diagnosis not present

## 2020-05-29 DIAGNOSIS — D631 Anemia in chronic kidney disease: Secondary | ICD-10-CM | POA: Diagnosis not present

## 2020-05-29 DIAGNOSIS — N2581 Secondary hyperparathyroidism of renal origin: Secondary | ICD-10-CM | POA: Diagnosis not present

## 2020-06-01 DIAGNOSIS — N2581 Secondary hyperparathyroidism of renal origin: Secondary | ICD-10-CM | POA: Diagnosis not present

## 2020-06-01 DIAGNOSIS — N186 End stage renal disease: Secondary | ICD-10-CM | POA: Diagnosis not present

## 2020-06-01 DIAGNOSIS — E1121 Type 2 diabetes mellitus with diabetic nephropathy: Secondary | ICD-10-CM | POA: Diagnosis not present

## 2020-06-01 DIAGNOSIS — D631 Anemia in chronic kidney disease: Secondary | ICD-10-CM | POA: Diagnosis not present

## 2020-06-01 DIAGNOSIS — D509 Iron deficiency anemia, unspecified: Secondary | ICD-10-CM | POA: Diagnosis not present

## 2020-06-01 DIAGNOSIS — D899 Disorder involving the immune mechanism, unspecified: Secondary | ICD-10-CM | POA: Diagnosis not present

## 2020-06-03 DIAGNOSIS — D509 Iron deficiency anemia, unspecified: Secondary | ICD-10-CM | POA: Diagnosis not present

## 2020-06-03 DIAGNOSIS — E1121 Type 2 diabetes mellitus with diabetic nephropathy: Secondary | ICD-10-CM | POA: Diagnosis not present

## 2020-06-03 DIAGNOSIS — D899 Disorder involving the immune mechanism, unspecified: Secondary | ICD-10-CM | POA: Diagnosis not present

## 2020-06-03 DIAGNOSIS — N2581 Secondary hyperparathyroidism of renal origin: Secondary | ICD-10-CM | POA: Diagnosis not present

## 2020-06-03 DIAGNOSIS — D631 Anemia in chronic kidney disease: Secondary | ICD-10-CM | POA: Diagnosis not present

## 2020-06-03 DIAGNOSIS — N186 End stage renal disease: Secondary | ICD-10-CM | POA: Diagnosis not present

## 2020-06-05 DIAGNOSIS — N2581 Secondary hyperparathyroidism of renal origin: Secondary | ICD-10-CM | POA: Diagnosis not present

## 2020-06-05 DIAGNOSIS — N186 End stage renal disease: Secondary | ICD-10-CM | POA: Diagnosis not present

## 2020-06-05 DIAGNOSIS — E1121 Type 2 diabetes mellitus with diabetic nephropathy: Secondary | ICD-10-CM | POA: Diagnosis not present

## 2020-06-05 DIAGNOSIS — D899 Disorder involving the immune mechanism, unspecified: Secondary | ICD-10-CM | POA: Diagnosis not present

## 2020-06-05 DIAGNOSIS — D631 Anemia in chronic kidney disease: Secondary | ICD-10-CM | POA: Diagnosis not present

## 2020-06-05 DIAGNOSIS — D509 Iron deficiency anemia, unspecified: Secondary | ICD-10-CM | POA: Diagnosis not present

## 2020-06-06 ENCOUNTER — Other Ambulatory Visit: Payer: Self-pay | Admitting: Cardiovascular Disease

## 2020-06-07 ENCOUNTER — Other Ambulatory Visit: Payer: Self-pay | Admitting: Cardiovascular Disease

## 2020-06-08 DIAGNOSIS — E1121 Type 2 diabetes mellitus with diabetic nephropathy: Secondary | ICD-10-CM | POA: Diagnosis not present

## 2020-06-08 DIAGNOSIS — D631 Anemia in chronic kidney disease: Secondary | ICD-10-CM | POA: Diagnosis not present

## 2020-06-08 DIAGNOSIS — D899 Disorder involving the immune mechanism, unspecified: Secondary | ICD-10-CM | POA: Diagnosis not present

## 2020-06-08 DIAGNOSIS — N186 End stage renal disease: Secondary | ICD-10-CM | POA: Diagnosis not present

## 2020-06-08 DIAGNOSIS — D509 Iron deficiency anemia, unspecified: Secondary | ICD-10-CM | POA: Diagnosis not present

## 2020-06-08 DIAGNOSIS — N2581 Secondary hyperparathyroidism of renal origin: Secondary | ICD-10-CM | POA: Diagnosis not present

## 2020-06-10 DIAGNOSIS — D899 Disorder involving the immune mechanism, unspecified: Secondary | ICD-10-CM | POA: Diagnosis not present

## 2020-06-10 DIAGNOSIS — D509 Iron deficiency anemia, unspecified: Secondary | ICD-10-CM | POA: Diagnosis not present

## 2020-06-10 DIAGNOSIS — N2581 Secondary hyperparathyroidism of renal origin: Secondary | ICD-10-CM | POA: Diagnosis not present

## 2020-06-10 DIAGNOSIS — D631 Anemia in chronic kidney disease: Secondary | ICD-10-CM | POA: Diagnosis not present

## 2020-06-10 DIAGNOSIS — N186 End stage renal disease: Secondary | ICD-10-CM | POA: Diagnosis not present

## 2020-06-10 DIAGNOSIS — E1121 Type 2 diabetes mellitus with diabetic nephropathy: Secondary | ICD-10-CM | POA: Diagnosis not present

## 2020-06-11 ENCOUNTER — Other Ambulatory Visit: Payer: Self-pay | Admitting: Cardiovascular Disease

## 2020-06-12 DIAGNOSIS — E1121 Type 2 diabetes mellitus with diabetic nephropathy: Secondary | ICD-10-CM | POA: Diagnosis not present

## 2020-06-12 DIAGNOSIS — D631 Anemia in chronic kidney disease: Secondary | ICD-10-CM | POA: Diagnosis not present

## 2020-06-12 DIAGNOSIS — D509 Iron deficiency anemia, unspecified: Secondary | ICD-10-CM | POA: Diagnosis not present

## 2020-06-12 DIAGNOSIS — N2581 Secondary hyperparathyroidism of renal origin: Secondary | ICD-10-CM | POA: Diagnosis not present

## 2020-06-12 DIAGNOSIS — N186 End stage renal disease: Secondary | ICD-10-CM | POA: Diagnosis not present

## 2020-06-12 DIAGNOSIS — D899 Disorder involving the immune mechanism, unspecified: Secondary | ICD-10-CM | POA: Diagnosis not present

## 2020-06-15 ENCOUNTER — Other Ambulatory Visit: Payer: Self-pay | Admitting: Cardiovascular Disease

## 2020-06-15 DIAGNOSIS — D631 Anemia in chronic kidney disease: Secondary | ICD-10-CM | POA: Diagnosis not present

## 2020-06-15 DIAGNOSIS — N2581 Secondary hyperparathyroidism of renal origin: Secondary | ICD-10-CM | POA: Diagnosis not present

## 2020-06-15 DIAGNOSIS — D899 Disorder involving the immune mechanism, unspecified: Secondary | ICD-10-CM | POA: Diagnosis not present

## 2020-06-15 DIAGNOSIS — E1121 Type 2 diabetes mellitus with diabetic nephropathy: Secondary | ICD-10-CM | POA: Diagnosis not present

## 2020-06-15 DIAGNOSIS — D509 Iron deficiency anemia, unspecified: Secondary | ICD-10-CM | POA: Diagnosis not present

## 2020-06-15 DIAGNOSIS — N186 End stage renal disease: Secondary | ICD-10-CM | POA: Diagnosis not present

## 2020-06-16 ENCOUNTER — Telehealth: Payer: Self-pay | Admitting: Cardiovascular Disease

## 2020-06-16 NOTE — Telephone Encounter (Signed)
Pt called and wanted to know if we could send a home attendent to her home. Wants to speak with Dr. Oval Linsey or nurse

## 2020-06-16 NOTE — Telephone Encounter (Signed)
Returned call to patient, she states she needs NTG sent to her pharmacy and was wondering if her visit with Dr. Oval Linsey can be virtual.     NTG sent 9/20 to pharmacy, advised prefer to be in person OV since it has been 1 year since in person visit.    Appt scheduled patient aware.

## 2020-06-17 ENCOUNTER — Other Ambulatory Visit: Payer: Self-pay | Admitting: Cardiovascular Disease

## 2020-06-17 DIAGNOSIS — E1121 Type 2 diabetes mellitus with diabetic nephropathy: Secondary | ICD-10-CM | POA: Diagnosis not present

## 2020-06-17 DIAGNOSIS — D509 Iron deficiency anemia, unspecified: Secondary | ICD-10-CM | POA: Diagnosis not present

## 2020-06-17 DIAGNOSIS — N2581 Secondary hyperparathyroidism of renal origin: Secondary | ICD-10-CM | POA: Diagnosis not present

## 2020-06-17 DIAGNOSIS — N186 End stage renal disease: Secondary | ICD-10-CM | POA: Diagnosis not present

## 2020-06-17 DIAGNOSIS — D631 Anemia in chronic kidney disease: Secondary | ICD-10-CM | POA: Diagnosis not present

## 2020-06-17 DIAGNOSIS — D899 Disorder involving the immune mechanism, unspecified: Secondary | ICD-10-CM | POA: Diagnosis not present

## 2020-06-18 DIAGNOSIS — Z992 Dependence on renal dialysis: Secondary | ICD-10-CM | POA: Diagnosis not present

## 2020-06-18 DIAGNOSIS — N186 End stage renal disease: Secondary | ICD-10-CM | POA: Diagnosis not present

## 2020-06-19 DIAGNOSIS — N186 End stage renal disease: Secondary | ICD-10-CM | POA: Diagnosis not present

## 2020-06-19 DIAGNOSIS — E1121 Type 2 diabetes mellitus with diabetic nephropathy: Secondary | ICD-10-CM | POA: Diagnosis not present

## 2020-06-19 DIAGNOSIS — D631 Anemia in chronic kidney disease: Secondary | ICD-10-CM | POA: Diagnosis not present

## 2020-06-19 DIAGNOSIS — E785 Hyperlipidemia, unspecified: Secondary | ICD-10-CM | POA: Diagnosis not present

## 2020-06-19 DIAGNOSIS — N2581 Secondary hyperparathyroidism of renal origin: Secondary | ICD-10-CM | POA: Diagnosis not present

## 2020-06-22 DIAGNOSIS — N186 End stage renal disease: Secondary | ICD-10-CM | POA: Diagnosis not present

## 2020-06-22 DIAGNOSIS — E785 Hyperlipidemia, unspecified: Secondary | ICD-10-CM | POA: Diagnosis not present

## 2020-06-22 DIAGNOSIS — D631 Anemia in chronic kidney disease: Secondary | ICD-10-CM | POA: Diagnosis not present

## 2020-06-22 DIAGNOSIS — E1121 Type 2 diabetes mellitus with diabetic nephropathy: Secondary | ICD-10-CM | POA: Diagnosis not present

## 2020-06-22 DIAGNOSIS — N2581 Secondary hyperparathyroidism of renal origin: Secondary | ICD-10-CM | POA: Diagnosis not present

## 2020-06-24 DIAGNOSIS — N186 End stage renal disease: Secondary | ICD-10-CM | POA: Diagnosis not present

## 2020-06-24 DIAGNOSIS — D631 Anemia in chronic kidney disease: Secondary | ICD-10-CM | POA: Diagnosis not present

## 2020-06-24 DIAGNOSIS — E785 Hyperlipidemia, unspecified: Secondary | ICD-10-CM | POA: Diagnosis not present

## 2020-06-24 DIAGNOSIS — E1121 Type 2 diabetes mellitus with diabetic nephropathy: Secondary | ICD-10-CM | POA: Diagnosis not present

## 2020-06-24 DIAGNOSIS — N2581 Secondary hyperparathyroidism of renal origin: Secondary | ICD-10-CM | POA: Diagnosis not present

## 2020-06-26 DIAGNOSIS — N186 End stage renal disease: Secondary | ICD-10-CM | POA: Diagnosis not present

## 2020-06-26 DIAGNOSIS — N2581 Secondary hyperparathyroidism of renal origin: Secondary | ICD-10-CM | POA: Diagnosis not present

## 2020-06-26 DIAGNOSIS — E785 Hyperlipidemia, unspecified: Secondary | ICD-10-CM | POA: Diagnosis not present

## 2020-06-26 DIAGNOSIS — D631 Anemia in chronic kidney disease: Secondary | ICD-10-CM | POA: Diagnosis not present

## 2020-06-26 DIAGNOSIS — E1121 Type 2 diabetes mellitus with diabetic nephropathy: Secondary | ICD-10-CM | POA: Diagnosis not present

## 2020-06-29 DIAGNOSIS — N2581 Secondary hyperparathyroidism of renal origin: Secondary | ICD-10-CM | POA: Diagnosis not present

## 2020-06-29 DIAGNOSIS — D631 Anemia in chronic kidney disease: Secondary | ICD-10-CM | POA: Diagnosis not present

## 2020-06-29 DIAGNOSIS — E1121 Type 2 diabetes mellitus with diabetic nephropathy: Secondary | ICD-10-CM | POA: Diagnosis not present

## 2020-06-29 DIAGNOSIS — E785 Hyperlipidemia, unspecified: Secondary | ICD-10-CM | POA: Diagnosis not present

## 2020-06-29 DIAGNOSIS — N186 End stage renal disease: Secondary | ICD-10-CM | POA: Diagnosis not present

## 2020-07-01 DIAGNOSIS — D631 Anemia in chronic kidney disease: Secondary | ICD-10-CM | POA: Diagnosis not present

## 2020-07-01 DIAGNOSIS — E1121 Type 2 diabetes mellitus with diabetic nephropathy: Secondary | ICD-10-CM | POA: Diagnosis not present

## 2020-07-01 DIAGNOSIS — N2581 Secondary hyperparathyroidism of renal origin: Secondary | ICD-10-CM | POA: Diagnosis not present

## 2020-07-01 DIAGNOSIS — E785 Hyperlipidemia, unspecified: Secondary | ICD-10-CM | POA: Diagnosis not present

## 2020-07-01 DIAGNOSIS — N186 End stage renal disease: Secondary | ICD-10-CM | POA: Diagnosis not present

## 2020-07-03 DIAGNOSIS — N2581 Secondary hyperparathyroidism of renal origin: Secondary | ICD-10-CM | POA: Diagnosis not present

## 2020-07-03 DIAGNOSIS — E785 Hyperlipidemia, unspecified: Secondary | ICD-10-CM | POA: Diagnosis not present

## 2020-07-03 DIAGNOSIS — N186 End stage renal disease: Secondary | ICD-10-CM | POA: Diagnosis not present

## 2020-07-03 DIAGNOSIS — E1121 Type 2 diabetes mellitus with diabetic nephropathy: Secondary | ICD-10-CM | POA: Diagnosis not present

## 2020-07-03 DIAGNOSIS — D631 Anemia in chronic kidney disease: Secondary | ICD-10-CM | POA: Diagnosis not present

## 2020-07-06 DIAGNOSIS — D631 Anemia in chronic kidney disease: Secondary | ICD-10-CM | POA: Diagnosis not present

## 2020-07-06 DIAGNOSIS — E785 Hyperlipidemia, unspecified: Secondary | ICD-10-CM | POA: Diagnosis not present

## 2020-07-06 DIAGNOSIS — N186 End stage renal disease: Secondary | ICD-10-CM | POA: Diagnosis not present

## 2020-07-06 DIAGNOSIS — E1121 Type 2 diabetes mellitus with diabetic nephropathy: Secondary | ICD-10-CM | POA: Diagnosis not present

## 2020-07-06 DIAGNOSIS — N2581 Secondary hyperparathyroidism of renal origin: Secondary | ICD-10-CM | POA: Diagnosis not present

## 2020-07-08 DIAGNOSIS — E785 Hyperlipidemia, unspecified: Secondary | ICD-10-CM | POA: Diagnosis not present

## 2020-07-08 DIAGNOSIS — N186 End stage renal disease: Secondary | ICD-10-CM | POA: Diagnosis not present

## 2020-07-08 DIAGNOSIS — E1121 Type 2 diabetes mellitus with diabetic nephropathy: Secondary | ICD-10-CM | POA: Diagnosis not present

## 2020-07-08 DIAGNOSIS — D631 Anemia in chronic kidney disease: Secondary | ICD-10-CM | POA: Diagnosis not present

## 2020-07-08 DIAGNOSIS — N2581 Secondary hyperparathyroidism of renal origin: Secondary | ICD-10-CM | POA: Diagnosis not present

## 2020-07-10 DIAGNOSIS — D631 Anemia in chronic kidney disease: Secondary | ICD-10-CM | POA: Diagnosis not present

## 2020-07-10 DIAGNOSIS — N186 End stage renal disease: Secondary | ICD-10-CM | POA: Diagnosis not present

## 2020-07-10 DIAGNOSIS — E785 Hyperlipidemia, unspecified: Secondary | ICD-10-CM | POA: Diagnosis not present

## 2020-07-10 DIAGNOSIS — E1121 Type 2 diabetes mellitus with diabetic nephropathy: Secondary | ICD-10-CM | POA: Diagnosis not present

## 2020-07-10 DIAGNOSIS — N2581 Secondary hyperparathyroidism of renal origin: Secondary | ICD-10-CM | POA: Diagnosis not present

## 2020-07-12 ENCOUNTER — Other Ambulatory Visit: Payer: Self-pay | Admitting: Surgery

## 2020-07-13 ENCOUNTER — Other Ambulatory Visit: Payer: Self-pay

## 2020-07-13 DIAGNOSIS — N2581 Secondary hyperparathyroidism of renal origin: Secondary | ICD-10-CM | POA: Diagnosis not present

## 2020-07-13 DIAGNOSIS — N186 End stage renal disease: Secondary | ICD-10-CM | POA: Diagnosis not present

## 2020-07-13 DIAGNOSIS — E1121 Type 2 diabetes mellitus with diabetic nephropathy: Secondary | ICD-10-CM | POA: Diagnosis not present

## 2020-07-13 DIAGNOSIS — E785 Hyperlipidemia, unspecified: Secondary | ICD-10-CM | POA: Diagnosis not present

## 2020-07-13 DIAGNOSIS — D631 Anemia in chronic kidney disease: Secondary | ICD-10-CM | POA: Diagnosis not present

## 2020-07-13 MED ORDER — TICAGRELOR 90 MG PO TABS: 90.0000 mg | ORAL_TABLET | Freq: Two times a day (BID) | ORAL | 3 refills | Status: DC

## 2020-07-15 DIAGNOSIS — D631 Anemia in chronic kidney disease: Secondary | ICD-10-CM | POA: Diagnosis not present

## 2020-07-15 DIAGNOSIS — N186 End stage renal disease: Secondary | ICD-10-CM | POA: Diagnosis not present

## 2020-07-15 DIAGNOSIS — E785 Hyperlipidemia, unspecified: Secondary | ICD-10-CM | POA: Diagnosis not present

## 2020-07-15 DIAGNOSIS — N2581 Secondary hyperparathyroidism of renal origin: Secondary | ICD-10-CM | POA: Diagnosis not present

## 2020-07-15 DIAGNOSIS — E1121 Type 2 diabetes mellitus with diabetic nephropathy: Secondary | ICD-10-CM | POA: Diagnosis not present

## 2020-07-17 DIAGNOSIS — E785 Hyperlipidemia, unspecified: Secondary | ICD-10-CM | POA: Diagnosis not present

## 2020-07-17 DIAGNOSIS — E1121 Type 2 diabetes mellitus with diabetic nephropathy: Secondary | ICD-10-CM | POA: Diagnosis not present

## 2020-07-17 DIAGNOSIS — N2581 Secondary hyperparathyroidism of renal origin: Secondary | ICD-10-CM | POA: Diagnosis not present

## 2020-07-17 DIAGNOSIS — D631 Anemia in chronic kidney disease: Secondary | ICD-10-CM | POA: Diagnosis not present

## 2020-07-17 DIAGNOSIS — N186 End stage renal disease: Secondary | ICD-10-CM | POA: Diagnosis not present

## 2020-07-19 DIAGNOSIS — N186 End stage renal disease: Secondary | ICD-10-CM | POA: Diagnosis not present

## 2020-07-19 DIAGNOSIS — Z992 Dependence on renal dialysis: Secondary | ICD-10-CM | POA: Diagnosis not present

## 2020-07-20 DIAGNOSIS — D631 Anemia in chronic kidney disease: Secondary | ICD-10-CM | POA: Diagnosis not present

## 2020-07-20 DIAGNOSIS — N186 End stage renal disease: Secondary | ICD-10-CM | POA: Diagnosis not present

## 2020-07-20 DIAGNOSIS — E1121 Type 2 diabetes mellitus with diabetic nephropathy: Secondary | ICD-10-CM | POA: Diagnosis not present

## 2020-07-20 DIAGNOSIS — N390 Urinary tract infection, site not specified: Secondary | ICD-10-CM | POA: Diagnosis not present

## 2020-07-20 DIAGNOSIS — N2581 Secondary hyperparathyroidism of renal origin: Secondary | ICD-10-CM | POA: Diagnosis not present

## 2020-07-22 DIAGNOSIS — E1121 Type 2 diabetes mellitus with diabetic nephropathy: Secondary | ICD-10-CM | POA: Diagnosis not present

## 2020-07-22 DIAGNOSIS — D631 Anemia in chronic kidney disease: Secondary | ICD-10-CM | POA: Diagnosis not present

## 2020-07-22 DIAGNOSIS — N186 End stage renal disease: Secondary | ICD-10-CM | POA: Diagnosis not present

## 2020-07-22 DIAGNOSIS — N2581 Secondary hyperparathyroidism of renal origin: Secondary | ICD-10-CM | POA: Diagnosis not present

## 2020-07-24 DIAGNOSIS — N2581 Secondary hyperparathyroidism of renal origin: Secondary | ICD-10-CM | POA: Diagnosis not present

## 2020-07-24 DIAGNOSIS — D631 Anemia in chronic kidney disease: Secondary | ICD-10-CM | POA: Diagnosis not present

## 2020-07-24 DIAGNOSIS — N186 End stage renal disease: Secondary | ICD-10-CM | POA: Diagnosis not present

## 2020-07-24 DIAGNOSIS — E1121 Type 2 diabetes mellitus with diabetic nephropathy: Secondary | ICD-10-CM | POA: Diagnosis not present

## 2020-07-27 DIAGNOSIS — N186 End stage renal disease: Secondary | ICD-10-CM | POA: Diagnosis not present

## 2020-07-27 DIAGNOSIS — N2581 Secondary hyperparathyroidism of renal origin: Secondary | ICD-10-CM | POA: Diagnosis not present

## 2020-07-27 DIAGNOSIS — D631 Anemia in chronic kidney disease: Secondary | ICD-10-CM | POA: Diagnosis not present

## 2020-07-27 DIAGNOSIS — E1121 Type 2 diabetes mellitus with diabetic nephropathy: Secondary | ICD-10-CM | POA: Diagnosis not present

## 2020-07-28 ENCOUNTER — Ambulatory Visit (INDEPENDENT_AMBULATORY_CARE_PROVIDER_SITE_OTHER): Payer: Medicare Other | Admitting: Cardiovascular Disease

## 2020-07-28 ENCOUNTER — Encounter: Payer: Self-pay | Admitting: Cardiovascular Disease

## 2020-07-28 ENCOUNTER — Other Ambulatory Visit: Payer: Self-pay

## 2020-07-28 VITALS — BP 155/73 | HR 91 | Temp 97.3°F | Ht 64.0 in

## 2020-07-28 DIAGNOSIS — I10 Essential (primary) hypertension: Secondary | ICD-10-CM

## 2020-07-28 DIAGNOSIS — I5042 Chronic combined systolic (congestive) and diastolic (congestive) heart failure: Secondary | ICD-10-CM

## 2020-07-28 DIAGNOSIS — I2510 Atherosclerotic heart disease of native coronary artery without angina pectoris: Secondary | ICD-10-CM | POA: Diagnosis not present

## 2020-07-28 DIAGNOSIS — I739 Peripheral vascular disease, unspecified: Secondary | ICD-10-CM | POA: Diagnosis not present

## 2020-07-28 DIAGNOSIS — I779 Disorder of arteries and arterioles, unspecified: Secondary | ICD-10-CM

## 2020-07-28 NOTE — Patient Instructions (Addendum)
Medication Instructions:  Your physician recommends that you continue on your current medications as directed. Please refer to the Current Medication list given to you today.  *If you need a refill on your cardiac medications before your next appointment, please call your pharmacy*  Lab Work: NONE   Testing/Procedures: NONE   Follow-Up: At [facility], you and your health needs are our priority.  As part of our continuing mission to provide you with exceptional heart care, we have created designated Provider Care Teams.  These Care Teams include your primary Cardiologist (physician) and Advanced Practice Providers (APPs -  Physician Assistants and Nurse Practitioners) who all work together to provide you with the care you need, when you need it.  We recommend signing up for the patient portal called "MyChart".  Sign up information is provided on this After Visit Summary.  MyChart is used to connect with patients for Virtual Visits (Telemedicine).  Patients are able to view lab/test results, encounter notes, upcoming appointments, etc.  Non-urgent messages can be sent to your provider as well.   To learn more about what you can do with MyChart, go to [email or web address]://[email or web address].com.    Your next appointment:   6 month(s)  You will receive a reminder letter in the mail two months in advance. If you don't receive a letter, please call our office to schedule the follow-up appointment.  The format for your next appointment:   In Person  Provider:   You may see Tiffany Dawson, MD or one of the following Advanced Practice Providers on your designated Care Team:    Luke Kilroy, PA-C  Callie Goodrich, PA-C  Jesse Cleaver, FNP     

## 2020-07-28 NOTE — Progress Notes (Signed)
Cardiology Office Visit  Date:  07/28/2020   ID:  [name], [name] [birth year], MRN [id number]  PCP:  [name], MD  Cardiologist:   Skeet Latch, MD   No chief complaint on file.    History of Present Illness: Jenna [name] is a 69 y.o. female with chronic systolic and diastolic heart failure, severe CAD, not a candidate for CABG, ESRD on HD (MWF) s/p R AKA, hypertension, DM here for follow up.  Jenna [name] was previously a [name] of Dr. Haroldine Laws.  She was initially admitted 03/2015 with dyspnea.  She underwent left and right heart catheterization that admission and was found to have severe three-vessel disease.  However she was not a candidate for CABG due to comorbidities and poor targets.  She required short-term milrinone for cardiogenic shock.  She was diuresed and able to get off of milrinone.  She was then admitted 12/2016 with hypertensive emergency and acute renal failure.  Troponin was elevated to 4.6.  This was felt to be due to demand ischemia.  She was treated with nitroglycerin and a heparin drip.  She was then again admitted 03/2017 with NSTEMI in Jenna setting of not taking ticagrelor as prescribed.  She was medically managed given her chronic kidney disease.  She was again admitted with NSTEMI 12/2017 with a peak troponin of 10.5.  She was again medically managed given CKD IV-V.  She was also evaluated by surgery and still considered not to be a surgical candidate.  Most recently she was admitted 04/2018 with chest pain and NSTEMI.  Troponin peaked with 2.2.  She is evaluated by Dr. Martinique who recommended medical management and not pursuing cardiac catheterization.  She last followed up in Jenna heart failure clinic 07/2018.  At that time she was feeling well.  She had an echo 07/2018 that revealed LVEF 45 to 50% with hypokinesis in Jenna apical inferolateral, inferior, inferoseptal, and apical myocardium.  Hydralazine was increased.  Since that time she was admitted 02/2019 with  chest pain and heart failure.  She self discontinued ticagrelor 01/2019 because it caused shortness of breath. Her renal dysfunction progressed to ESRD and she started dialysis.  She had an AV fistula placed 03/04/19.  She also had bilateral pleural effusions and underwent right thoracentesis with removal of 1.2 L of fluid.  She followed up with [name], DNP, on 03/14/2019 and was doing well.  She is going to [facility] vascular center today and hopes to be able to start using her fistula next week.  She is tolerating HD well.  Occasionally her BP gets low and nearly passes out.  She reports that this is when they run her too fast.  Her BP is usually "good" when she first gets to HD.  She doesn't check it at home often.  She denies LE edema since starting HD and is happy that she has been able to get off a lot of her medications.    Since her last appointment Jenna [name] has been feeling okay.  Her main complaint is that she hates going to dialysis.  Her blood pressure gets low with dialysis and she has near syncope.  She does not take her blood pressure medications on Jenna day of her HD.  States making that change things have been more stable.  She has no chest pain and her breathing is good.  She denies lower extremity edema, orthopnea, or PND.  She is frustrated that she gains weight.  She has not  been getting any exercise.  She also struggles with bursitis in her right leg.  Dr. Ed Blalock nephrology    Past Medical History:  Diagnosis Date  . Anemia   . Anxiety    takes Xanax prn  . Arthritis   . Cataract    left eye  . CKD (chronic kidney disease), stage IV ([place])   . Coronary artery disease 2016   cath w/ 90% LAD, 95%CFX, 80% OM 2, 60% RCA, not CABG candidate, rx medically  . Full dentures   . GERD (gastroesophageal reflux disease)   . H/O hiatal hernia   . Headache(784.0)    when b/p is elevated  . Hemorrhoids   . Hx of AKA (above knee amputation), right ([place])   . Hyperlipidemia     takes Simvastatin daily  . Hypertension    takes Amlodipine/HCTZ/Losartan daily  . Migraines   . Myocardial infarction ([place]) 1990's  . NSTEMI (non-ST elevated myocardial infarction) ([place])   . Peripheral vascular disease ([place])   . PONV (postoperative nausea and vomiting)   . Stroke [facility])    TIA history  . Type II diabetes mellitus (HCC)    on Lantus  . Vertigo    takes Ativert prn    Past Surgical History:  Procedure Laterality Date  . ABDOMINAL AORTAGRAM N/A 02/29/2012   Procedure: ABDOMINAL Maxcine Ham;  Surgeon: [name], MD;  Location: [facility] CATH LAB;  Service: Cardiovascular;  Laterality: N/A;  . ABDOMINAL AORTOGRAM W/LOWER EXTREMITY N/A 02/11/2020   Procedure: ABDOMINAL AORTOGRAM W/LOWER EXTREMITY;  Surgeon: [name], MD;  Location: [place] CV LAB;  Service: Cardiovascular;  Laterality: N/A;  . ABDOMINAL AORTOGRAM W/LOWER EXTREMITY N/A 03/17/2020   Procedure: ABDOMINAL AORTOGRAM W/LOWER EXTREMITY;  Surgeon: [name], MD;  Location: [place] CV LAB;  Service: Cardiovascular;  Laterality: N/A;  . AMPUTATION  05/10/2012   Procedure: AMPUTATION ABOVE KNEE;  Surgeon: [name], MD;  Location: [place] OR;  Service: Vascular;  Laterality: Right;   open right groin wound noted  . aortogram  02/2012  . AV FISTULA PLACEMENT Right 03/04/2019   Procedure: ARTERIOVENOUS (AV) FISTULA CREATION;  Surgeon: Rosetta Posner, MD;  Location: [place];  Service: Vascular;  Laterality: Right;  . CARDIAC CATHETERIZATION N/A 04/13/2015   Procedure: Right/Left Heart Cath and Coronary Angiography;  Surgeon: [name], MD;  Location: [place] CV LAB;  Service: Cardiovascular;  Laterality: N/A;  . CLEFT PALATE REPAIR     several  . COLONOSCOPY    . DILATION AND CURETTAGE OF UTERUS    . FEMORAL-POPLITEAL BYPASS GRAFT  04/05/2012   Procedure: BYPASS GRAFT FEMORAL-POPLITEAL ARTERY;  Surgeon: [name], MD;  Location: [place];  Service: Vascular;  Laterality: Right;  REVISION  .  FEMORAL-TIBIAL BYPASS GRAFT  03/29/2012   Procedure: BYPASS GRAFT FEMORAL-TIBIAL ARTERY;  Surgeon: [name], MD;  Location: [place] OR;  Service: Vascular;  Laterality: Right;  Right femoral to Posterior Tibialis with composite graft of 70mm x 80 cm ringed gortex graft and saphenous vein  ,intraoperative arteriogram  . FEMOROPOPLITEAL THROMBECTOMY / EMBOLECTOMY  05/09/2012  . IR FLUORO GUIDE CV LINE RIGHT  03/01/2019  . IR US GUIDE VASC ACCESS RIGHT  03/01/2019  . MULTIPLE TOOTH EXTRACTIONS    . MYOMECTOMY    . PERIPHERAL VASCULAR INTERVENTION Left 03/17/2020   Procedure: PERIPHERAL VASCULAR INTERVENTION;  Surgeon: [name], MD;  Location: [place] CV LAB;  Service: Cardiovascular;  Laterality: Left;  popliteal  . PR VEIN BYPASS GRAFT,AORTO-FEM-POP  05/27/11   Right SFA-Below knee Pop BP  . REVISION OF ARTERIOVENOUS GORETEX GRAFT Right 08/21/2019   Procedure: REVISION OF ARTERIOVENOUS FISTULA RIGHT ARM;  Surgeon: Rosetta Posner, MD;  Location: [facility] OR;  Service: Vascular;  Laterality: Right;  . TEE WITHOUT CARDIOVERSION N/A 01/03/2017   Procedure: TRANSESOPHAGEAL ECHOCARDIOGRAM (TEE);  Surgeon: Skeet Latch, MD;  Location: [place];  Service: Cardiovascular;  Laterality: N/A;  . THORACENTESIS  02/27/2019      . TONSILLECTOMY    . TUBAL LIGATION  ~ 1990     Current Outpatient Medications  Medication Sig Dispense Refill  . acetaminophen (TYLENOL) 500 MG tablet Take 500 mg by mouth every 6 (six) hours as needed for mild pain.    Marland Kitchen ALPRAZolam (XANAX) 0.25 MG tablet Take 1 tablet (0.25 mg total) by mouth 3 (three) times daily as needed for anxiety. 12 tablet 0  . amLODipine (NORVASC) 10 MG tablet Take 10 mg by mouth daily.    Marland Kitchen aspirin 81 MG chewable tablet Chew 81 mg by mouth daily.    Marland Kitchen atorvastatin (LIPITOR) 80 MG tablet Take 80 mg by mouth daily.     . carvedilol (COREG) 25 MG tablet Take 25 mg by mouth daily.     . cholecalciferol (VITAMIN D) 25 MCG (1000 UT) tablet Take 1,000  Units by mouth daily.    Marland Kitchen docusate sodium (COLACE) 100 MG capsule Take 100 mg by mouth daily as needed for mild constipation.    . insulin glargine (LANTUS) 100 UNIT/ML injection Inject 0.2 mLs (20 Units total) into Jenna skin at bedtime. ([name] taking differently: Inject 5 Units into Jenna skin at bedtime. ) 10 mL 11  . nitroGLYCERIN (NITROSTAT) 0.4 MG SL tablet PLACE 1 TABLET UNDER Jenna TONGUE EVERY 5 MINUTES FOR 3 DOSES AS NEEDED FOR CHEST PAIN 25 tablet 2  . RENVELA 800 MG tablet Take 800 mg by mouth 3 (three) times daily with meals.     . ticagrelor (BRILINTA) 90 MG TABS tablet Take 1 tablet (90 mg total) by mouth 2 (two) times daily. 60 tablet 3   No current facility-administered medications for this visit.    Allergies:   Plavix [clopidogrel bisulfate], Codeine, and Tylenol with codeine #3 [acetaminophen-codeine]    Social History:  Jenna [name]  reports that she quit smoking about 8 years ago. Her smoking use included cigarettes. She has a 10.00 pack-year smoking history. She has never used smokeless tobacco. She reports that she does not drink alcohol and does not use drugs.   Family History:  Jenna [name]'s family history includes Cancer in her brother and mother; Diabetes in her father; Heart attack in her maternal grandfather and maternal grandmother.    ROS:  Please see Jenna history of present illness.   Otherwise, review of systems are positive for none.   All other systems are reviewed and negative.    PHYSICAL EXAM: VS:  BP (!) 155/73   Pulse 91   Temp (!) 97.3 F (36.3 C)   Ht 5\' 4"  (1.626 m)   SpO2 98%   BMI 26.26 kg/m  , BMI Body mass index is 26.26 kg/m. GENERAL:  Well appearing HEENT: Pupils equal round and reactive, fundi not visualized, oral mucosa unremarkable NECK:  No jugular venous distention, waveform within normal limits, carotid upstroke brisk and symmetric, no bruits LUNGS:  Clear to auscultation bilaterally HEART:  RRR.  PMI not displaced or sustained,S1  and S2 within  normal limits, no S3, no S4, no clicks, no rubs, no murmurs ABD:  Flat, positive bowel sounds normal in frequency in pitch, no bruits, no rebound, no guarding, no midline pulsatile mass, no hepatomegaly, no splenomegaly EXT:  R AKA.  No edema SKIN:  No rashes no nodules NEURO:  Cranial nerves II through XII grossly intact, motor grossly intact throughout PSYCH:  Cognitively intact, oriented to person place and time   EKG:  EKG is not ordered today. Jenna ekg ordered 11/16/18 demonstrates sinus rhythm.  Rate 91 bpm.   Prior inferior infarct.  Diffuse T wave abnormality  TEE 01/03/17  45%, trivial MR and TR, no LA/LAA thrombus or mass, negative bubble study.   Echo 03/2017 Left ventricle: LVEF is approximately 45 to 50% with mild hypokinesis of Jenna distal inferior/inferoseptal and apical walls. Jenna cavity size was normal. Wall thickness was increased in a pattern of mild LVH. Doppler parameters are consistent with abnormal left ventricular relaxation (grade 1 diastolic dysfunction). - Aortic valve: AV is thickened, calcified In Jenna parasternal view there is an nodular echo bright segment (18 x 7 mm) May represent nodular thickening of valve Not convincing for discrete mass. It appears to be present on TTE in April 2018 TEE after that showed no abnormalitiy to AV. - Mitral valve: Calcified annulus. Mildly thickened leaflets   Echo 07/2018: Study Conclusions  - Left ventricle: Jenna cavity size was normal. There was mild focal   basal hypertrophy of Jenna septum. Systolic function was mildly   reduced. Jenna estimated ejection fraction was in Jenna range of 45%   to 50%. Hypokinesis of Jenna apicalinferolateral, inferior,   inferoseptal, and apical myocardium. - Aortic valve: Trileaflet; mildly thickened, mildly calcified   leaflets. Left coronary cusp mobility was moderately restricted. - Mitral valve: Calcified annulus. Mildly thickened leaflets .   There was  trivial regurgitation. - Right ventricle: Jenna cavity size was mildly dilated. Wall   thickness was normal. Systolic function was low normal. - Atrial septum: No defect or patent foramen ovale was identified. - Tricuspid valve: There was no significant regurgitation. - Pulmonic valve: There was trivial regurgitation.  Impressions:  - EF using wall tracking approximately 45%. Hypokinesis of distal   inferior, inferolateral, inferoseptal, and apical walls. Wall   motion visually appears similar to prior. Thickened LCC of aortic   valve appears similar to prior.  RHC/LHC 04/14/15  Prox LAD to Mid LAD lesion, 90% stenosed.  Ost Cx to Mid Cx lesion, 95% stenosed.  Ost 2nd Mrg to 2nd Mrg lesion, 80% stenosed.  Mid Cx lesion, 90% stenosed.  Prox RCA lesion, 60% stenosed.  Mid RCA lesion, 50% stenosed.  Recent Labs: 03/17/2020: BUN 34; Creatinine, Ser 4.60; Hemoglobin 10.9; Potassium 4.6; Sodium 136    Lipid Panel    Component Value Date/Time   CHOL 162 09/15/2018 0522   TRIG 51 09/15/2018 0522   HDL 45 09/15/2018 0522   CHOLHDL 3.6 09/15/2018 0522   VLDL 10 09/15/2018 0522   LDLCALC 107 (H) 09/15/2018 0522      Wt Readings from Last 3 Encounters:  04/21/20 153 lb (69.4 kg)  03/17/20 153 lb (69.4 kg)  03/03/20 155 lb (70.3 kg)      ASSESSMENT AND PLAN:  # Obstructive CAD: # Recurrent NSTEMI: # Hyperlipidemia: 3 vessel CAD that is medically managed.  She is not a good candidate for CABG or PCI.  She has chronic stable angina.  She has not needed any nitroglycerin.  Continue aspirin,  atorvastatin, carvedilol, and ticagrelor.  We will try to get a copy of her lipids from her PCP.  # Hypertension:  BP poorly controlled.  However we are not able to be more aggressive with her blood pressure control due to hypotension with hemodialysis.  Given that she sees nephrology regularly we will not make any changes to her regimen and will defer to their recommendations.  Continue  carvedilol and amlodipine.  ESRD on HD:  Jenna [name] is now on HD and tolerating it well.    # Prior CVA: Continue aspirin and statin.  # Chronic systolic and diastolic heart failure: LVEF 45-50%.  Continue carvedilol.  Volume managed with HD.  Time spent: 30 minutes-Greater than 50% of this time was spent in counseling, explanation of diagnosis, planning of further management, and coordination of care.  Current medicines are reviewed at length with Jenna [name] today.  Jenna [name] does not have concerns regarding medicines.  Jenna following changes have been made:  None  Labs/ tests ordered today include:  No orders of Jenna defined types were placed in this encounter.    Disposition:   FU with Trishna Cwik C. Oval Linsey, MD, [facility] in 6 months.    Signed, Virgilio Broadhead C. Oval Linsey, MD, [facility]  07/28/2020 5:08 PM    [place]

## 2020-07-29 DIAGNOSIS — E1121 Type 2 diabetes mellitus with diabetic nephropathy: Secondary | ICD-10-CM | POA: Diagnosis not present

## 2020-07-29 DIAGNOSIS — D631 Anemia in chronic kidney disease: Secondary | ICD-10-CM | POA: Diagnosis not present

## 2020-07-29 DIAGNOSIS — N2581 Secondary hyperparathyroidism of renal origin: Secondary | ICD-10-CM | POA: Diagnosis not present

## 2020-07-29 DIAGNOSIS — N186 End stage renal disease: Secondary | ICD-10-CM | POA: Diagnosis not present

## 2020-07-31 DIAGNOSIS — D631 Anemia in chronic kidney disease: Secondary | ICD-10-CM | POA: Diagnosis not present

## 2020-07-31 DIAGNOSIS — E1121 Type 2 diabetes mellitus with diabetic nephropathy: Secondary | ICD-10-CM | POA: Diagnosis not present

## 2020-07-31 DIAGNOSIS — N2581 Secondary hyperparathyroidism of renal origin: Secondary | ICD-10-CM | POA: Diagnosis not present

## 2020-07-31 DIAGNOSIS — N186 End stage renal disease: Secondary | ICD-10-CM | POA: Diagnosis not present

## 2020-08-03 DIAGNOSIS — N2581 Secondary hyperparathyroidism of renal origin: Secondary | ICD-10-CM | POA: Diagnosis not present

## 2020-08-03 DIAGNOSIS — N186 End stage renal disease: Secondary | ICD-10-CM | POA: Diagnosis not present

## 2020-08-03 DIAGNOSIS — D631 Anemia in chronic kidney disease: Secondary | ICD-10-CM | POA: Diagnosis not present

## 2020-08-03 DIAGNOSIS — E1121 Type 2 diabetes mellitus with diabetic nephropathy: Secondary | ICD-10-CM | POA: Diagnosis not present

## 2020-08-05 DIAGNOSIS — N2581 Secondary hyperparathyroidism of renal origin: Secondary | ICD-10-CM | POA: Diagnosis not present

## 2020-08-05 DIAGNOSIS — E1121 Type 2 diabetes mellitus with diabetic nephropathy: Secondary | ICD-10-CM | POA: Diagnosis not present

## 2020-08-05 DIAGNOSIS — D631 Anemia in chronic kidney disease: Secondary | ICD-10-CM | POA: Diagnosis not present

## 2020-08-05 DIAGNOSIS — N186 End stage renal disease: Secondary | ICD-10-CM | POA: Diagnosis not present

## 2020-08-07 DIAGNOSIS — E1121 Type 2 diabetes mellitus with diabetic nephropathy: Secondary | ICD-10-CM | POA: Diagnosis not present

## 2020-08-07 DIAGNOSIS — N186 End stage renal disease: Secondary | ICD-10-CM | POA: Diagnosis not present

## 2020-08-07 DIAGNOSIS — N2581 Secondary hyperparathyroidism of renal origin: Secondary | ICD-10-CM | POA: Diagnosis not present

## 2020-08-07 DIAGNOSIS — D631 Anemia in chronic kidney disease: Secondary | ICD-10-CM | POA: Diagnosis not present

## 2020-08-09 DIAGNOSIS — D631 Anemia in chronic kidney disease: Secondary | ICD-10-CM | POA: Diagnosis not present

## 2020-08-09 DIAGNOSIS — N2581 Secondary hyperparathyroidism of renal origin: Secondary | ICD-10-CM | POA: Diagnosis not present

## 2020-08-09 DIAGNOSIS — N186 End stage renal disease: Secondary | ICD-10-CM | POA: Diagnosis not present

## 2020-08-09 DIAGNOSIS — E1121 Type 2 diabetes mellitus with diabetic nephropathy: Secondary | ICD-10-CM | POA: Diagnosis not present

## 2020-08-11 DIAGNOSIS — N2581 Secondary hyperparathyroidism of renal origin: Secondary | ICD-10-CM | POA: Diagnosis not present

## 2020-08-11 DIAGNOSIS — N186 End stage renal disease: Secondary | ICD-10-CM | POA: Diagnosis not present

## 2020-08-11 DIAGNOSIS — D631 Anemia in chronic kidney disease: Secondary | ICD-10-CM | POA: Diagnosis not present

## 2020-08-11 DIAGNOSIS — E1121 Type 2 diabetes mellitus with diabetic nephropathy: Secondary | ICD-10-CM | POA: Diagnosis not present

## 2020-08-17 DIAGNOSIS — E1121 Type 2 diabetes mellitus with diabetic nephropathy: Secondary | ICD-10-CM | POA: Diagnosis not present

## 2020-08-17 DIAGNOSIS — N2581 Secondary hyperparathyroidism of renal origin: Secondary | ICD-10-CM | POA: Diagnosis not present

## 2020-08-17 DIAGNOSIS — D631 Anemia in chronic kidney disease: Secondary | ICD-10-CM | POA: Diagnosis not present

## 2020-08-17 DIAGNOSIS — N186 End stage renal disease: Secondary | ICD-10-CM | POA: Diagnosis not present

## 2020-08-18 DIAGNOSIS — Z992 Dependence on renal dialysis: Secondary | ICD-10-CM | POA: Diagnosis not present

## 2020-08-18 DIAGNOSIS — N186 End stage renal disease: Secondary | ICD-10-CM | POA: Diagnosis not present

## 2020-08-19 DIAGNOSIS — E1121 Type 2 diabetes mellitus with diabetic nephropathy: Secondary | ICD-10-CM | POA: Diagnosis not present

## 2020-08-19 DIAGNOSIS — N186 End stage renal disease: Secondary | ICD-10-CM | POA: Diagnosis not present

## 2020-08-19 DIAGNOSIS — D631 Anemia in chronic kidney disease: Secondary | ICD-10-CM | POA: Diagnosis not present

## 2020-08-19 DIAGNOSIS — N2581 Secondary hyperparathyroidism of renal origin: Secondary | ICD-10-CM | POA: Diagnosis not present

## 2020-08-19 DIAGNOSIS — D509 Iron deficiency anemia, unspecified: Secondary | ICD-10-CM | POA: Diagnosis not present

## 2020-08-21 DIAGNOSIS — E1121 Type 2 diabetes mellitus with diabetic nephropathy: Secondary | ICD-10-CM | POA: Diagnosis not present

## 2020-08-21 DIAGNOSIS — D509 Iron deficiency anemia, unspecified: Secondary | ICD-10-CM | POA: Diagnosis not present

## 2020-08-21 DIAGNOSIS — N186 End stage renal disease: Secondary | ICD-10-CM | POA: Diagnosis not present

## 2020-08-21 DIAGNOSIS — D631 Anemia in chronic kidney disease: Secondary | ICD-10-CM | POA: Diagnosis not present

## 2020-08-21 DIAGNOSIS — N2581 Secondary hyperparathyroidism of renal origin: Secondary | ICD-10-CM | POA: Diagnosis not present

## 2020-08-24 DIAGNOSIS — D631 Anemia in chronic kidney disease: Secondary | ICD-10-CM | POA: Diagnosis not present

## 2020-08-24 DIAGNOSIS — E1121 Type 2 diabetes mellitus with diabetic nephropathy: Secondary | ICD-10-CM | POA: Diagnosis not present

## 2020-08-24 DIAGNOSIS — D509 Iron deficiency anemia, unspecified: Secondary | ICD-10-CM | POA: Diagnosis not present

## 2020-08-24 DIAGNOSIS — N186 End stage renal disease: Secondary | ICD-10-CM | POA: Diagnosis not present

## 2020-08-24 DIAGNOSIS — N2581 Secondary hyperparathyroidism of renal origin: Secondary | ICD-10-CM | POA: Diagnosis not present

## 2020-08-26 DIAGNOSIS — D509 Iron deficiency anemia, unspecified: Secondary | ICD-10-CM | POA: Diagnosis not present

## 2020-08-26 DIAGNOSIS — N2581 Secondary hyperparathyroidism of renal origin: Secondary | ICD-10-CM | POA: Diagnosis not present

## 2020-08-26 DIAGNOSIS — E1121 Type 2 diabetes mellitus with diabetic nephropathy: Secondary | ICD-10-CM | POA: Diagnosis not present

## 2020-08-26 DIAGNOSIS — D631 Anemia in chronic kidney disease: Secondary | ICD-10-CM | POA: Diagnosis not present

## 2020-08-26 DIAGNOSIS — N186 End stage renal disease: Secondary | ICD-10-CM | POA: Diagnosis not present

## 2020-08-28 DIAGNOSIS — D509 Iron deficiency anemia, unspecified: Secondary | ICD-10-CM | POA: Diagnosis not present

## 2020-08-28 DIAGNOSIS — N2581 Secondary hyperparathyroidism of renal origin: Secondary | ICD-10-CM | POA: Diagnosis not present

## 2020-08-28 DIAGNOSIS — E1121 Type 2 diabetes mellitus with diabetic nephropathy: Secondary | ICD-10-CM | POA: Diagnosis not present

## 2020-08-28 DIAGNOSIS — D631 Anemia in chronic kidney disease: Secondary | ICD-10-CM | POA: Diagnosis not present

## 2020-08-28 DIAGNOSIS — N186 End stage renal disease: Secondary | ICD-10-CM | POA: Diagnosis not present

## 2020-08-31 DIAGNOSIS — N2581 Secondary hyperparathyroidism of renal origin: Secondary | ICD-10-CM | POA: Diagnosis not present

## 2020-08-31 DIAGNOSIS — N186 End stage renal disease: Secondary | ICD-10-CM | POA: Diagnosis not present

## 2020-08-31 DIAGNOSIS — D631 Anemia in chronic kidney disease: Secondary | ICD-10-CM | POA: Diagnosis not present

## 2020-08-31 DIAGNOSIS — E1121 Type 2 diabetes mellitus with diabetic nephropathy: Secondary | ICD-10-CM | POA: Diagnosis not present

## 2020-08-31 DIAGNOSIS — D509 Iron deficiency anemia, unspecified: Secondary | ICD-10-CM | POA: Diagnosis not present

## 2020-09-02 DIAGNOSIS — D631 Anemia in chronic kidney disease: Secondary | ICD-10-CM | POA: Diagnosis not present

## 2020-09-02 DIAGNOSIS — D509 Iron deficiency anemia, unspecified: Secondary | ICD-10-CM | POA: Diagnosis not present

## 2020-09-02 DIAGNOSIS — N186 End stage renal disease: Secondary | ICD-10-CM | POA: Diagnosis not present

## 2020-09-02 DIAGNOSIS — N2581 Secondary hyperparathyroidism of renal origin: Secondary | ICD-10-CM | POA: Diagnosis not present

## 2020-09-02 DIAGNOSIS — E1121 Type 2 diabetes mellitus with diabetic nephropathy: Secondary | ICD-10-CM | POA: Diagnosis not present

## 2020-09-04 DIAGNOSIS — N2581 Secondary hyperparathyroidism of renal origin: Secondary | ICD-10-CM | POA: Diagnosis not present

## 2020-09-04 DIAGNOSIS — D509 Iron deficiency anemia, unspecified: Secondary | ICD-10-CM | POA: Diagnosis not present

## 2020-09-04 DIAGNOSIS — E1121 Type 2 diabetes mellitus with diabetic nephropathy: Secondary | ICD-10-CM | POA: Diagnosis not present

## 2020-09-04 DIAGNOSIS — N186 End stage renal disease: Secondary | ICD-10-CM | POA: Diagnosis not present

## 2020-09-04 DIAGNOSIS — D631 Anemia in chronic kidney disease: Secondary | ICD-10-CM | POA: Diagnosis not present

## 2020-09-07 DIAGNOSIS — D509 Iron deficiency anemia, unspecified: Secondary | ICD-10-CM | POA: Diagnosis not present

## 2020-09-07 DIAGNOSIS — D631 Anemia in chronic kidney disease: Secondary | ICD-10-CM | POA: Diagnosis not present

## 2020-09-07 DIAGNOSIS — E1121 Type 2 diabetes mellitus with diabetic nephropathy: Secondary | ICD-10-CM | POA: Diagnosis not present

## 2020-09-07 DIAGNOSIS — N2581 Secondary hyperparathyroidism of renal origin: Secondary | ICD-10-CM | POA: Diagnosis not present

## 2020-09-07 DIAGNOSIS — N186 End stage renal disease: Secondary | ICD-10-CM | POA: Diagnosis not present

## 2020-09-09 DIAGNOSIS — N186 End stage renal disease: Secondary | ICD-10-CM | POA: Diagnosis not present

## 2020-09-09 DIAGNOSIS — N2581 Secondary hyperparathyroidism of renal origin: Secondary | ICD-10-CM | POA: Diagnosis not present

## 2020-09-09 DIAGNOSIS — D509 Iron deficiency anemia, unspecified: Secondary | ICD-10-CM | POA: Diagnosis not present

## 2020-09-09 DIAGNOSIS — D631 Anemia in chronic kidney disease: Secondary | ICD-10-CM | POA: Diagnosis not present

## 2020-09-09 DIAGNOSIS — E1121 Type 2 diabetes mellitus with diabetic nephropathy: Secondary | ICD-10-CM | POA: Diagnosis not present

## 2020-09-11 DIAGNOSIS — D631 Anemia in chronic kidney disease: Secondary | ICD-10-CM | POA: Diagnosis not present

## 2020-09-11 DIAGNOSIS — E1121 Type 2 diabetes mellitus with diabetic nephropathy: Secondary | ICD-10-CM | POA: Diagnosis not present

## 2020-09-11 DIAGNOSIS — D509 Iron deficiency anemia, unspecified: Secondary | ICD-10-CM | POA: Diagnosis not present

## 2020-09-11 DIAGNOSIS — N2581 Secondary hyperparathyroidism of renal origin: Secondary | ICD-10-CM | POA: Diagnosis not present

## 2020-09-11 DIAGNOSIS — N186 End stage renal disease: Secondary | ICD-10-CM | POA: Diagnosis not present

## 2020-09-12 ENCOUNTER — Emergency Department (HOSPITAL_COMMUNITY): Payer: Medicare Other

## 2020-09-12 ENCOUNTER — Encounter (HOSPITAL_COMMUNITY): Payer: Self-pay

## 2020-09-12 ENCOUNTER — Other Ambulatory Visit: Payer: Self-pay

## 2020-09-12 ENCOUNTER — Emergency Department (HOSPITAL_COMMUNITY)
Admission: EM | Admit: 2020-09-12 | Discharge: 2020-09-12 | Disposition: A | Discharge status: Home / Self Care | Payer: Medicare Other | Attending: Emergency Medicine | Admitting: Emergency Medicine

## 2020-09-12 DIAGNOSIS — Z79899 Other long term (current) drug therapy: Secondary | ICD-10-CM | POA: Diagnosis not present

## 2020-09-12 DIAGNOSIS — I132 Hypertensive heart and chronic kidney disease with heart failure and with stage 5 chronic kidney disease, or end stage renal disease: Secondary | ICD-10-CM | POA: Insufficient documentation

## 2020-09-12 DIAGNOSIS — I2510 Atherosclerotic heart disease of native coronary artery without angina pectoris: Secondary | ICD-10-CM | POA: Diagnosis not present

## 2020-09-12 DIAGNOSIS — Z7901 Long term (current) use of anticoagulants: Secondary | ICD-10-CM | POA: Diagnosis not present

## 2020-09-12 DIAGNOSIS — Z89611 Acquired absence of right leg above knee: Secondary | ICD-10-CM | POA: Insufficient documentation

## 2020-09-12 DIAGNOSIS — Z7982 Long term (current) use of aspirin: Secondary | ICD-10-CM | POA: Insufficient documentation

## 2020-09-12 DIAGNOSIS — I5042 Chronic combined systolic (congestive) and diastolic (congestive) heart failure: Secondary | ICD-10-CM | POA: Insufficient documentation

## 2020-09-12 DIAGNOSIS — E785 Hyperlipidemia, unspecified: Secondary | ICD-10-CM | POA: Diagnosis not present

## 2020-09-12 DIAGNOSIS — Z87891 Personal history of nicotine dependence: Secondary | ICD-10-CM | POA: Diagnosis not present

## 2020-09-12 DIAGNOSIS — M25551 Pain in right hip: Secondary | ICD-10-CM | POA: Diagnosis not present

## 2020-09-12 DIAGNOSIS — E1169 Type 2 diabetes mellitus with other specified complication: Secondary | ICD-10-CM | POA: Insufficient documentation

## 2020-09-12 DIAGNOSIS — N186 End stage renal disease: Secondary | ICD-10-CM | POA: Insufficient documentation

## 2020-09-12 DIAGNOSIS — Z794 Long term (current) use of insulin: Secondary | ICD-10-CM | POA: Diagnosis not present

## 2020-09-12 DIAGNOSIS — M79604 Pain in right leg: Secondary | ICD-10-CM | POA: Diagnosis present

## 2020-09-12 DIAGNOSIS — M1611 Unilateral primary osteoarthritis, right hip: Secondary | ICD-10-CM | POA: Diagnosis not present

## 2020-09-12 MED ORDER — OXYCODONE-ACETAMINOPHEN 5-325 MG PO TABS: 1.0000 | ORAL_TABLET | ORAL | 0 refills | Status: AC | PRN

## 2020-09-12 MED ORDER — METHOCARBAMOL 500 MG PO TABS
500.0000 mg | ORAL_TABLET | Freq: Once | ORAL | Status: AC
  Administered 2020-09-12: 500 mg via ORAL
  Filled 2020-09-12: qty 1

## 2020-09-12 MED ORDER — OXYCODONE-ACETAMINOPHEN 5-325 MG PO TABS
1.0000 | ORAL_TABLET | Freq: Once | ORAL | Status: AC
  Administered 2020-09-12: 1 via ORAL
  Filled 2020-09-12: qty 1

## 2020-09-12 NOTE — ED Notes (Signed)
Pt transported to xray 

## 2020-09-12 NOTE — ED Notes (Signed)
Pt d/c home per MD order. Discharge summary reviewed with pt, pt verbalizes understanding. Discharge ride here. Off unit via WC. No s/s of acute distress noted.

## 2020-09-12 NOTE — Discharge Instructions (Addendum)
Your x-ray today was within normal limits.  I have provided a prescription for pain medication, please take this medication as prescribed.

## 2020-09-12 NOTE — ED Triage Notes (Signed)
Pt reports bursitis to her left leg stump since last night. Pt hypertensive in triage but states she didn't take her BP meds due to the pain.

## 2020-09-12 NOTE — ED Provider Notes (Signed)
[facility] EMERGENCY DEPARTMENT Provider Note   CSN: [id number] Arrival date & time: 09/12/20  1211     History Chief Complaint  Patient presents with  . Bursitis    Jenna Brown is a 69 y.o. female.  69 y.o female with a PMH of R AKA, NSTEMI, CKD presents to the ED with a chief complaint of right leg pain.  Reports a similar episode several years ago, where she was treated for bursitis.  States a "little white pill is the only thing that helps".  Reports pain began today, feels a muscle spasming that kind of comes and goes.  It is exacerbated with palpation along with movement.  Has taken Tylenol x2 without improvement in her symptoms.  There is no fever, hip pain, drainage from her wound, other complaints.  Of note, patient found to be hypertensive on arrival, reports he has not taken any of her blood pressure medication due to pain.  The history is provided by the patient.       Past Medical History:  Diagnosis Date  . Anemia   . Anxiety    takes Xanax prn  . Arthritis   . Cataract    left eye  . CKD (chronic kidney disease), stage IV ([place])   . Coronary artery disease 2016   cath w/ 90% LAD, 95%CFX, 80% OM 2, 60% RCA, not CABG candidate, rx medically  . Full dentures   . GERD (gastroesophageal reflux disease)   . H/O hiatal hernia   . Headache(784.0)    when b/p is elevated  . Hemorrhoids   . Hx of AKA (above knee amputation), right ([place])   . Hyperlipidemia    takes Simvastatin daily  . Hypertension    takes Amlodipine/HCTZ/Losartan daily  . Migraines   . Myocardial infarction ([place]) 1990's  . NSTEMI (non-ST elevated myocardial infarction) ([place])   . Peripheral vascular disease ([place])   . PONV (postoperative nausea and vomiting)   . Stroke [facility])    TIA history  . Type II diabetes mellitus (HCC)    on Lantus  . Vertigo    takes Ativert prn    Patient Active Problem List   Diagnosis Date Noted  . Critical lower limb ischemia ([place])  02/07/2020  . Pleural effusion, bilateral 02/26/2019  . History of non-ST elevation myocardial infarction (NSTEMI) 09/15/2018  . Goals of care, counseling/discussion   . Palliative care by specialist   . 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([place])   . Insulin-requiring or dependent type II diabetes mellitus ([place]) 01/11/2018  . CAD in native artery   . History of TIA (transient ischemic attack) 05/14/2015  . Chronic combined systolic and diastolic heart failure ([place]) 05/14/2015  . Hyperkalemia   . Essential hypertension   . Hyperlipidemia LDL goal <70   . Anemia 05/10/2012  . Anxiety 05/09/2012  . Peripheral vascular disease [facility])     Past Surgical History:  Procedure Laterality Date  . ABDOMINAL AORTAGRAM N/A 02/29/2012   Procedure: ABDOMINAL Maxcine Ham;  Surgeon: [name], MD;  Location: [facility] CATH LAB;  Service: Cardiovascular;  Laterality: N/A;  . ABDOMINAL AORTOGRAM W/LOWER EXTREMITY N/A 02/11/2020   Procedure: ABDOMINAL AORTOGRAM W/LOWER EXTREMITY;  Surgeon: [name], MD;  Location: [facility] CV LAB;  Service: Cardiovascular;  Laterality: N/A;  . ABDOMINAL AORTOGRAM W/LOWER EXTREMITY N/A 03/17/2020   Procedure: ABDOMINAL AORTOGRAM W/LOWER EXTREMITY;  Surgeon: [name], MD;  Location: [place] CV LAB;  Service: Cardiovascular;  Laterality: N/A;  .  AMPUTATION  05/10/2012   Procedure: AMPUTATION ABOVE KNEE;  Surgeon: [name], MD;  Location: [facility] OR;  Service: Vascular;  Laterality: Right;   open right groin wound noted  . aortogram  02/2012  . AV FISTULA PLACEMENT Right 03/04/2019   Procedure: ARTERIOVENOUS (AV) FISTULA CREATION;  Surgeon: Rosetta Posner, MD;  Location: [place];  Service: Vascular;  Laterality: Right;  . CARDIAC CATHETERIZATION N/A 04/13/2015   Procedure: Right/Left Heart Cath and Coronary Angiography;  Surgeon: [name], MD;  Location: [place] CV LAB;  Service: Cardiovascular;  Laterality: N/A;  . CLEFT PALATE REPAIR     several  . COLONOSCOPY    .  DILATION AND CURETTAGE OF UTERUS    . FEMORAL-POPLITEAL BYPASS GRAFT  04/05/2012   Procedure: BYPASS GRAFT FEMORAL-POPLITEAL ARTERY;  Surgeon: [name], MD;  Location: [facility];  Service: Vascular;  Laterality: Right;  REVISION  . FEMORAL-TIBIAL BYPASS GRAFT  03/29/2012   Procedure: BYPASS GRAFT FEMORAL-TIBIAL ARTERY;  Surgeon: [name], MD;  Location: [facility] OR;  Service: Vascular;  Laterality: Right;  Right femoral to Posterior Tibialis with composite graft of 71mm x 80 cm ringed gortex graft and saphenous vein  ,intraoperative arteriogram  . FEMOROPOPLITEAL THROMBECTOMY / EMBOLECTOMY  05/09/2012  . IR FLUORO GUIDE CV LINE RIGHT  03/01/2019  . IR US GUIDE VASC ACCESS RIGHT  03/01/2019  . MULTIPLE TOOTH EXTRACTIONS    . MYOMECTOMY    . PERIPHERAL VASCULAR INTERVENTION Left 03/17/2020   Procedure: PERIPHERAL VASCULAR INTERVENTION;  Surgeon: [name], MD;  Location: [place] CV LAB;  Service: Cardiovascular;  Laterality: Left;  popliteal  . PR VEIN BYPASS GRAFT,AORTO-FEM-POP  05/27/11   Right SFA-Below knee Pop BP  . REVISION OF ARTERIOVENOUS GORETEX GRAFT Right 08/21/2019   Procedure: REVISION OF ARTERIOVENOUS FISTULA RIGHT ARM;  Surgeon: Rosetta Posner, MD;  Location: [facility] OR;  Service: Vascular;  Laterality: Right;  . TEE WITHOUT CARDIOVERSION N/A 01/03/2017   Procedure: TRANSESOPHAGEAL ECHOCARDIOGRAM (TEE);  Surgeon: Skeet Latch, MD;  Location: [place];  Service: Cardiovascular;  Laterality: N/A;  . THORACENTESIS  02/27/2019      . TONSILLECTOMY    . TUBAL LIGATION  ~ 1990     OB History   No obstetric history on file.     Family History  Problem Relation Age of Onset  . Cancer Mother        breast  . Diabetes Father   . Cancer Brother   . Heart attack Maternal Grandmother   . Heart attack Maternal Grandfather     Social History   Tobacco Use  . Smoking status: Former Smoker    Packs/day: 0.25    Years: 40.00    Pack years: 10.00    Types: Cigarettes     Quit date: 02/27/2012    Years since quitting: 8.5  . Smokeless tobacco: Never Used  Vaping Use  . Vaping Use: Never used  Substance Use Topics  . Alcohol use: No  . Drug use: No    Home Medications Prior to Admission medications   Medication Sig Start Date End Date Taking? Authorizing Provider  acetaminophen (TYLENOL) 500 MG tablet Take 500 mg by mouth every 6 (six) hours as needed for mild pain.    [provider]  ALPRAZolam Duanne Moron) 0.25 MG tablet Take 1 tablet (0.25 mg total) by mouth 3 (three) times daily as needed for anxiety. 01/03/17   Delos Haring, PA-C  amLODipine (NORVASC) 10 MG  tablet Take 10 mg by mouth daily. 06/04/20   [provider]  aspirin 81 MG chewable tablet Chew 81 mg by mouth daily.    [provider]  atorvastatin (LIPITOR) 80 MG tablet Take 80 mg by mouth daily.  01/09/18   [provider]  carvedilol (COREG) 25 MG tablet Take 25 mg by mouth daily.     [provider]  cholecalciferol (VITAMIN D) 25 MCG (1000 UT) tablet Take 1,000 Units by mouth daily.    [provider]  docusate sodium (COLACE) 100 MG capsule Take 100 mg by mouth daily as needed for mild constipation.    [provider]  insulin glargine (LANTUS) 100 UNIT/ML injection Inject 0.2 mLs (20 Units total) into the skin at bedtime. Patient taking differently: Inject 5 Units into the skin at bedtime.  03/06/19   Georgette Shell, MD  nitroGLYCERIN (NITROSTAT) 0.4 MG SL tablet PLACE 1 TABLET UNDER THE TONGUE EVERY 5 MINUTES FOR 3 DOSES AS NEEDED FOR CHEST PAIN 06/17/20   Skeet Latch, MD  oxyCODONE-acetaminophen (PERCOCET/ROXICET) 5-325 MG tablet Take 1 tablet by mouth every 4 (four) hours as needed for up to 3 days for severe pain. 09/12/20 09/15/20  Janeece Fitting, PA-C  RENVELA 800 MG tablet Take 800 mg by mouth 3 (three) times daily with meals.     [provider]  ticagrelor (BRILINTA) 90 MG TABS tablet Take 1 tablet (90 mg  total) by mouth 2 (two) times daily. 07/13/20   [name], MD    Allergies    Plavix [clopidogrel bisulfate], Codeine, and Tylenol with codeine #3 [acetaminophen-codeine]  Review of Systems   Review of Systems  Constitutional: Negative for fever.  Respiratory: Negative for shortness of breath.   Cardiovascular: Negative for chest pain.  Gastrointestinal: Negative for abdominal pain.  Genitourinary: Negative for flank pain.  Musculoskeletal: Positive for myalgias.  All other systems reviewed and are negative.   Physical Exam Updated Vital Signs BP (!) 200/83   Pulse (!) 103   Temp 98.5 F (36.9 C) (Oral)   Resp 18   SpO2 97%   Physical Exam Vitals and nursing note reviewed.  Constitutional:      Appearance: Normal appearance.  HENT:     Head: Normocephalic and atraumatic.     Mouth/Throat:     Mouth: Mucous membranes are moist.  Cardiovascular:     Rate and Rhythm: Normal rate.  Pulmonary:     Effort: Pulmonary effort is normal.  Abdominal:     General: Abdomen is flat.  Musculoskeletal:     Cervical back: Normal range of motion and neck supple.       Legs:     Comments: Right AKA, no erythema, no edema, no streaking in the skin.      Right Lower Extremity: Right leg is amputated above knee.  Skin:    General: Skin is warm and dry.  Neurological:     Mental Status: She is alert and oriented to person, place, and time.     ED Results / Procedures / Treatments   Labs (all labs ordered are listed, but only abnormal results are displayed) Labs Reviewed - No data to display  EKG None  Radiology DG Hip Unilat W or Wo Pelvis 2-3 Views Right  Result Date: 09/12/2020 CLINICAL DATA:  Right-sided hip pain and stump pain for 2 days EXAM: DG HIP (WITH OR WITHOUT PELVIS) 2-3V RIGHT COMPARISON:  07/12/2018 FINDINGS: The pelvic ring is intact. Mild  degenerative changes of the hip joints are noted. No acute fracture or dislocation is noted. Skin folds are noted  over the hip proximally. No other focal abnormality is noted. IMPRESSION: No acute abnormality noted. Electronically Signed   By: Inez Catalina M.D.   On: 09/12/2020 14:12    Procedures Procedures (including critical care time)  Medications Ordered in ED Medications  oxyCODONE-acetaminophen (PERCOCET/ROXICET) 5-325 MG per tablet 1 tablet (1 tablet Oral Given 09/12/20 1406)  methocarbamol (ROBAXIN) tablet 500 mg (500 mg Oral Given 09/12/20 1406)    ED Course  I have reviewed the triage vital signs and the nursing notes.  Pertinent labs & imaging results that were available during my care of the patient were reviewed by me and considered in my medical decision making (see chart for details).    MDM Rules/Calculators/A&P   Patient with an extensive medical history including right AKA presents to the ED with pain at this time.  Reports pain is similar to the episode she had several years ago, where she was diagnosed with likely bursitis.  Patient denies any fever, drainage from her wound, erythema, other complaints or injury.   Evaluation of her right stump revealed no erythema, edema, changes in the skin, lower suspicion for cellulitis at this time.  I do see muscle spasms occurring at the time of the evaluation.  She reports previously being managed with short course of Percocet.  X-ray of the right hip show no fracture, or acute pathology.  Given Robaxin, and Percocet to help with pain control.  3:07 PM Patient reevaluated by me, reports improvement in pain is requesting short course of pain meds.  I have discussed xray results with patient, she is aware of results. Patient stable for discharge. Return precautions discussed at length.      Portions of this note were generated with [employer]. Dictation errors may occur despite best attempts at proofreading.  Final Clinical Impression(s) / ED Diagnoses Final diagnoses:  Right hip pain    Rx / DC Orders ED  Discharge Orders         Ordered    oxyCODONE-acetaminophen (PERCOCET/ROXICET) 5-325 MG tablet  Every 4 hours PRN        09/12/20 1506           Janeece Fitting, PA-C 09/12/20 1510    Quintella Reichert, MD 09/12/20 1537

## 2020-09-14 DIAGNOSIS — E1121 Type 2 diabetes mellitus with diabetic nephropathy: Secondary | ICD-10-CM | POA: Diagnosis not present

## 2020-09-14 DIAGNOSIS — N186 End stage renal disease: Secondary | ICD-10-CM | POA: Diagnosis not present

## 2020-09-14 DIAGNOSIS — D631 Anemia in chronic kidney disease: Secondary | ICD-10-CM | POA: Diagnosis not present

## 2020-09-14 DIAGNOSIS — N2581 Secondary hyperparathyroidism of renal origin: Secondary | ICD-10-CM | POA: Diagnosis not present

## 2020-09-14 DIAGNOSIS — D509 Iron deficiency anemia, unspecified: Secondary | ICD-10-CM | POA: Diagnosis not present

## 2020-09-16 DIAGNOSIS — N2581 Secondary hyperparathyroidism of renal origin: Secondary | ICD-10-CM | POA: Diagnosis not present

## 2020-09-16 DIAGNOSIS — N186 End stage renal disease: Secondary | ICD-10-CM | POA: Diagnosis not present

## 2020-09-16 DIAGNOSIS — E1121 Type 2 diabetes mellitus with diabetic nephropathy: Secondary | ICD-10-CM | POA: Diagnosis not present

## 2020-09-16 DIAGNOSIS — D509 Iron deficiency anemia, unspecified: Secondary | ICD-10-CM | POA: Diagnosis not present

## 2020-09-16 DIAGNOSIS — D631 Anemia in chronic kidney disease: Secondary | ICD-10-CM | POA: Diagnosis not present

## 2020-09-18 DIAGNOSIS — N2581 Secondary hyperparathyroidism of renal origin: Secondary | ICD-10-CM | POA: Diagnosis not present

## 2020-09-18 DIAGNOSIS — D509 Iron deficiency anemia, unspecified: Secondary | ICD-10-CM | POA: Diagnosis not present

## 2020-09-18 DIAGNOSIS — Z992 Dependence on renal dialysis: Secondary | ICD-10-CM | POA: Diagnosis not present

## 2020-09-18 DIAGNOSIS — E1121 Type 2 diabetes mellitus with diabetic nephropathy: Secondary | ICD-10-CM | POA: Diagnosis not present

## 2020-09-18 DIAGNOSIS — D631 Anemia in chronic kidney disease: Secondary | ICD-10-CM | POA: Diagnosis not present

## 2020-09-18 DIAGNOSIS — N186 End stage renal disease: Secondary | ICD-10-CM | POA: Diagnosis not present

## 2020-09-21 DIAGNOSIS — E785 Hyperlipidemia, unspecified: Secondary | ICD-10-CM | POA: Diagnosis not present

## 2020-09-21 DIAGNOSIS — N186 End stage renal disease: Secondary | ICD-10-CM | POA: Diagnosis not present

## 2020-09-21 DIAGNOSIS — E1121 Type 2 diabetes mellitus with diabetic nephropathy: Secondary | ICD-10-CM | POA: Diagnosis not present

## 2020-09-21 DIAGNOSIS — N2581 Secondary hyperparathyroidism of renal origin: Secondary | ICD-10-CM | POA: Diagnosis not present

## 2020-09-21 DIAGNOSIS — D631 Anemia in chronic kidney disease: Secondary | ICD-10-CM | POA: Diagnosis not present

## 2020-09-21 DIAGNOSIS — D509 Iron deficiency anemia, unspecified: Secondary | ICD-10-CM | POA: Diagnosis not present

## 2020-09-23 DIAGNOSIS — N186 End stage renal disease: Secondary | ICD-10-CM | POA: Diagnosis not present

## 2020-09-23 DIAGNOSIS — D509 Iron deficiency anemia, unspecified: Secondary | ICD-10-CM | POA: Diagnosis not present

## 2020-09-23 DIAGNOSIS — E785 Hyperlipidemia, unspecified: Secondary | ICD-10-CM | POA: Diagnosis not present

## 2020-09-23 DIAGNOSIS — E1121 Type 2 diabetes mellitus with diabetic nephropathy: Secondary | ICD-10-CM | POA: Diagnosis not present

## 2020-09-23 DIAGNOSIS — D631 Anemia in chronic kidney disease: Secondary | ICD-10-CM | POA: Diagnosis not present

## 2020-09-23 DIAGNOSIS — N2581 Secondary hyperparathyroidism of renal origin: Secondary | ICD-10-CM | POA: Diagnosis not present

## 2020-09-25 DIAGNOSIS — D631 Anemia in chronic kidney disease: Secondary | ICD-10-CM | POA: Diagnosis not present

## 2020-09-25 DIAGNOSIS — N186 End stage renal disease: Secondary | ICD-10-CM | POA: Diagnosis not present

## 2020-09-25 DIAGNOSIS — N2581 Secondary hyperparathyroidism of renal origin: Secondary | ICD-10-CM | POA: Diagnosis not present

## 2020-09-25 DIAGNOSIS — D509 Iron deficiency anemia, unspecified: Secondary | ICD-10-CM | POA: Diagnosis not present

## 2020-09-25 DIAGNOSIS — E1121 Type 2 diabetes mellitus with diabetic nephropathy: Secondary | ICD-10-CM | POA: Diagnosis not present

## 2020-09-25 DIAGNOSIS — E785 Hyperlipidemia, unspecified: Secondary | ICD-10-CM | POA: Diagnosis not present

## 2020-09-28 DIAGNOSIS — N186 End stage renal disease: Secondary | ICD-10-CM | POA: Diagnosis not present

## 2020-09-28 DIAGNOSIS — N2581 Secondary hyperparathyroidism of renal origin: Secondary | ICD-10-CM | POA: Diagnosis not present

## 2020-09-28 DIAGNOSIS — E785 Hyperlipidemia, unspecified: Secondary | ICD-10-CM | POA: Diagnosis not present

## 2020-09-28 DIAGNOSIS — E1121 Type 2 diabetes mellitus with diabetic nephropathy: Secondary | ICD-10-CM | POA: Diagnosis not present

## 2020-09-28 DIAGNOSIS — D631 Anemia in chronic kidney disease: Secondary | ICD-10-CM | POA: Diagnosis not present

## 2020-09-28 DIAGNOSIS — D509 Iron deficiency anemia, unspecified: Secondary | ICD-10-CM | POA: Diagnosis not present

## 2020-09-29 ENCOUNTER — Other Ambulatory Visit: Payer: Self-pay | Admitting: Cardiovascular Disease

## 2020-09-30 DIAGNOSIS — E785 Hyperlipidemia, unspecified: Secondary | ICD-10-CM | POA: Diagnosis not present

## 2020-09-30 DIAGNOSIS — E1121 Type 2 diabetes mellitus with diabetic nephropathy: Secondary | ICD-10-CM | POA: Diagnosis not present

## 2020-09-30 DIAGNOSIS — N2581 Secondary hyperparathyroidism of renal origin: Secondary | ICD-10-CM | POA: Diagnosis not present

## 2020-09-30 DIAGNOSIS — D509 Iron deficiency anemia, unspecified: Secondary | ICD-10-CM | POA: Diagnosis not present

## 2020-09-30 DIAGNOSIS — N186 End stage renal disease: Secondary | ICD-10-CM | POA: Diagnosis not present

## 2020-09-30 DIAGNOSIS — D631 Anemia in chronic kidney disease: Secondary | ICD-10-CM | POA: Diagnosis not present

## 2020-10-02 DIAGNOSIS — D631 Anemia in chronic kidney disease: Secondary | ICD-10-CM | POA: Diagnosis not present

## 2020-10-02 DIAGNOSIS — E785 Hyperlipidemia, unspecified: Secondary | ICD-10-CM | POA: Diagnosis not present

## 2020-10-02 DIAGNOSIS — N186 End stage renal disease: Secondary | ICD-10-CM | POA: Diagnosis not present

## 2020-10-02 DIAGNOSIS — E1121 Type 2 diabetes mellitus with diabetic nephropathy: Secondary | ICD-10-CM | POA: Diagnosis not present

## 2020-10-02 DIAGNOSIS — N2581 Secondary hyperparathyroidism of renal origin: Secondary | ICD-10-CM | POA: Diagnosis not present

## 2020-10-02 DIAGNOSIS — D509 Iron deficiency anemia, unspecified: Secondary | ICD-10-CM | POA: Diagnosis not present

## 2020-10-08 DIAGNOSIS — N2581 Secondary hyperparathyroidism of renal origin: Secondary | ICD-10-CM | POA: Diagnosis not present

## 2020-10-08 DIAGNOSIS — E1121 Type 2 diabetes mellitus with diabetic nephropathy: Secondary | ICD-10-CM | POA: Diagnosis not present

## 2020-10-08 DIAGNOSIS — D509 Iron deficiency anemia, unspecified: Secondary | ICD-10-CM | POA: Diagnosis not present

## 2020-10-08 DIAGNOSIS — E785 Hyperlipidemia, unspecified: Secondary | ICD-10-CM | POA: Diagnosis not present

## 2020-10-08 DIAGNOSIS — N186 End stage renal disease: Secondary | ICD-10-CM | POA: Diagnosis not present

## 2020-10-08 DIAGNOSIS — D631 Anemia in chronic kidney disease: Secondary | ICD-10-CM | POA: Diagnosis not present

## 2020-10-09 DIAGNOSIS — N2581 Secondary hyperparathyroidism of renal origin: Secondary | ICD-10-CM | POA: Diagnosis not present

## 2020-10-09 DIAGNOSIS — D509 Iron deficiency anemia, unspecified: Secondary | ICD-10-CM | POA: Diagnosis not present

## 2020-10-09 DIAGNOSIS — D631 Anemia in chronic kidney disease: Secondary | ICD-10-CM | POA: Diagnosis not present

## 2020-10-09 DIAGNOSIS — N186 End stage renal disease: Secondary | ICD-10-CM | POA: Diagnosis not present

## 2020-10-09 DIAGNOSIS — E1121 Type 2 diabetes mellitus with diabetic nephropathy: Secondary | ICD-10-CM | POA: Diagnosis not present

## 2020-10-09 DIAGNOSIS — E785 Hyperlipidemia, unspecified: Secondary | ICD-10-CM | POA: Diagnosis not present

## 2020-10-12 DIAGNOSIS — D509 Iron deficiency anemia, unspecified: Secondary | ICD-10-CM | POA: Diagnosis not present

## 2020-10-12 DIAGNOSIS — N2581 Secondary hyperparathyroidism of renal origin: Secondary | ICD-10-CM | POA: Diagnosis not present

## 2020-10-12 DIAGNOSIS — N186 End stage renal disease: Secondary | ICD-10-CM | POA: Diagnosis not present

## 2020-10-12 DIAGNOSIS — E785 Hyperlipidemia, unspecified: Secondary | ICD-10-CM | POA: Diagnosis not present

## 2020-10-12 DIAGNOSIS — D631 Anemia in chronic kidney disease: Secondary | ICD-10-CM | POA: Diagnosis not present

## 2020-10-12 DIAGNOSIS — E1121 Type 2 diabetes mellitus with diabetic nephropathy: Secondary | ICD-10-CM | POA: Diagnosis not present

## 2020-10-14 DIAGNOSIS — D631 Anemia in chronic kidney disease: Secondary | ICD-10-CM | POA: Diagnosis not present

## 2020-10-14 DIAGNOSIS — N2581 Secondary hyperparathyroidism of renal origin: Secondary | ICD-10-CM | POA: Diagnosis not present

## 2020-10-14 DIAGNOSIS — Z1159 Encounter for screening for other viral diseases: Secondary | ICD-10-CM | POA: Diagnosis not present

## 2020-10-14 DIAGNOSIS — E785 Hyperlipidemia, unspecified: Secondary | ICD-10-CM | POA: Diagnosis not present

## 2020-10-14 DIAGNOSIS — N186 End stage renal disease: Secondary | ICD-10-CM | POA: Diagnosis not present

## 2020-10-14 DIAGNOSIS — Z114 Encounter for screening for human immunodeficiency virus [HIV]: Secondary | ICD-10-CM | POA: Diagnosis not present

## 2020-10-14 DIAGNOSIS — D509 Iron deficiency anemia, unspecified: Secondary | ICD-10-CM | POA: Diagnosis not present

## 2020-10-14 DIAGNOSIS — E1121 Type 2 diabetes mellitus with diabetic nephropathy: Secondary | ICD-10-CM | POA: Diagnosis not present

## 2020-10-16 DIAGNOSIS — N2581 Secondary hyperparathyroidism of renal origin: Secondary | ICD-10-CM | POA: Diagnosis not present

## 2020-10-16 DIAGNOSIS — N186 End stage renal disease: Secondary | ICD-10-CM | POA: Diagnosis not present

## 2020-10-16 DIAGNOSIS — E785 Hyperlipidemia, unspecified: Secondary | ICD-10-CM | POA: Diagnosis not present

## 2020-10-16 DIAGNOSIS — D509 Iron deficiency anemia, unspecified: Secondary | ICD-10-CM | POA: Diagnosis not present

## 2020-10-16 DIAGNOSIS — D631 Anemia in chronic kidney disease: Secondary | ICD-10-CM | POA: Diagnosis not present

## 2020-10-16 DIAGNOSIS — E1121 Type 2 diabetes mellitus with diabetic nephropathy: Secondary | ICD-10-CM | POA: Diagnosis not present

## 2020-10-19 DIAGNOSIS — E785 Hyperlipidemia, unspecified: Secondary | ICD-10-CM | POA: Diagnosis not present

## 2020-10-19 DIAGNOSIS — Z992 Dependence on renal dialysis: Secondary | ICD-10-CM | POA: Diagnosis not present

## 2020-10-19 DIAGNOSIS — N186 End stage renal disease: Secondary | ICD-10-CM | POA: Diagnosis not present

## 2020-10-19 DIAGNOSIS — N2581 Secondary hyperparathyroidism of renal origin: Secondary | ICD-10-CM | POA: Diagnosis not present

## 2020-10-19 DIAGNOSIS — D631 Anemia in chronic kidney disease: Secondary | ICD-10-CM | POA: Diagnosis not present

## 2020-10-19 DIAGNOSIS — E1121 Type 2 diabetes mellitus with diabetic nephropathy: Secondary | ICD-10-CM | POA: Diagnosis not present

## 2020-10-19 DIAGNOSIS — D509 Iron deficiency anemia, unspecified: Secondary | ICD-10-CM | POA: Diagnosis not present

## 2020-10-21 DIAGNOSIS — D631 Anemia in chronic kidney disease: Secondary | ICD-10-CM | POA: Diagnosis not present

## 2020-10-21 DIAGNOSIS — N186 End stage renal disease: Secondary | ICD-10-CM | POA: Diagnosis not present

## 2020-10-21 DIAGNOSIS — E1121 Type 2 diabetes mellitus with diabetic nephropathy: Secondary | ICD-10-CM | POA: Diagnosis not present

## 2020-10-21 DIAGNOSIS — N2581 Secondary hyperparathyroidism of renal origin: Secondary | ICD-10-CM | POA: Diagnosis not present

## 2020-10-21 DIAGNOSIS — D509 Iron deficiency anemia, unspecified: Secondary | ICD-10-CM | POA: Diagnosis not present

## 2020-10-23 DIAGNOSIS — D631 Anemia in chronic kidney disease: Secondary | ICD-10-CM | POA: Diagnosis not present

## 2020-10-23 DIAGNOSIS — N186 End stage renal disease: Secondary | ICD-10-CM | POA: Diagnosis not present

## 2020-10-23 DIAGNOSIS — N2581 Secondary hyperparathyroidism of renal origin: Secondary | ICD-10-CM | POA: Diagnosis not present

## 2020-10-23 DIAGNOSIS — E1121 Type 2 diabetes mellitus with diabetic nephropathy: Secondary | ICD-10-CM | POA: Diagnosis not present

## 2020-10-23 DIAGNOSIS — D509 Iron deficiency anemia, unspecified: Secondary | ICD-10-CM | POA: Diagnosis not present

## 2020-10-26 DIAGNOSIS — D509 Iron deficiency anemia, unspecified: Secondary | ICD-10-CM | POA: Diagnosis not present

## 2020-10-26 DIAGNOSIS — E1121 Type 2 diabetes mellitus with diabetic nephropathy: Secondary | ICD-10-CM | POA: Diagnosis not present

## 2020-10-26 DIAGNOSIS — N186 End stage renal disease: Secondary | ICD-10-CM | POA: Diagnosis not present

## 2020-10-26 DIAGNOSIS — N2581 Secondary hyperparathyroidism of renal origin: Secondary | ICD-10-CM | POA: Diagnosis not present

## 2020-10-26 DIAGNOSIS — D631 Anemia in chronic kidney disease: Secondary | ICD-10-CM | POA: Diagnosis not present

## 2020-10-28 DIAGNOSIS — D509 Iron deficiency anemia, unspecified: Secondary | ICD-10-CM | POA: Diagnosis not present

## 2020-10-28 DIAGNOSIS — E1121 Type 2 diabetes mellitus with diabetic nephropathy: Secondary | ICD-10-CM | POA: Diagnosis not present

## 2020-10-28 DIAGNOSIS — N2581 Secondary hyperparathyroidism of renal origin: Secondary | ICD-10-CM | POA: Diagnosis not present

## 2020-10-28 DIAGNOSIS — N186 End stage renal disease: Secondary | ICD-10-CM | POA: Diagnosis not present

## 2020-10-28 DIAGNOSIS — D631 Anemia in chronic kidney disease: Secondary | ICD-10-CM | POA: Diagnosis not present

## 2020-10-30 DIAGNOSIS — E1121 Type 2 diabetes mellitus with diabetic nephropathy: Secondary | ICD-10-CM | POA: Diagnosis not present

## 2020-10-30 DIAGNOSIS — D509 Iron deficiency anemia, unspecified: Secondary | ICD-10-CM | POA: Diagnosis not present

## 2020-10-30 DIAGNOSIS — N2581 Secondary hyperparathyroidism of renal origin: Secondary | ICD-10-CM | POA: Diagnosis not present

## 2020-10-30 DIAGNOSIS — D631 Anemia in chronic kidney disease: Secondary | ICD-10-CM | POA: Diagnosis not present

## 2020-10-30 DIAGNOSIS — N186 End stage renal disease: Secondary | ICD-10-CM | POA: Diagnosis not present

## 2020-11-02 DIAGNOSIS — D509 Iron deficiency anemia, unspecified: Secondary | ICD-10-CM | POA: Diagnosis not present

## 2020-11-02 DIAGNOSIS — N2581 Secondary hyperparathyroidism of renal origin: Secondary | ICD-10-CM | POA: Diagnosis not present

## 2020-11-02 DIAGNOSIS — N186 End stage renal disease: Secondary | ICD-10-CM | POA: Diagnosis not present

## 2020-11-02 DIAGNOSIS — E1121 Type 2 diabetes mellitus with diabetic nephropathy: Secondary | ICD-10-CM | POA: Diagnosis not present

## 2020-11-02 DIAGNOSIS — D631 Anemia in chronic kidney disease: Secondary | ICD-10-CM | POA: Diagnosis not present

## 2020-11-04 DIAGNOSIS — N2581 Secondary hyperparathyroidism of renal origin: Secondary | ICD-10-CM | POA: Diagnosis not present

## 2020-11-04 DIAGNOSIS — D509 Iron deficiency anemia, unspecified: Secondary | ICD-10-CM | POA: Diagnosis not present

## 2020-11-04 DIAGNOSIS — E1121 Type 2 diabetes mellitus with diabetic nephropathy: Secondary | ICD-10-CM | POA: Diagnosis not present

## 2020-11-04 DIAGNOSIS — N186 End stage renal disease: Secondary | ICD-10-CM | POA: Diagnosis not present

## 2020-11-04 DIAGNOSIS — D631 Anemia in chronic kidney disease: Secondary | ICD-10-CM | POA: Diagnosis not present

## 2020-11-06 DIAGNOSIS — D631 Anemia in chronic kidney disease: Secondary | ICD-10-CM | POA: Diagnosis not present

## 2020-11-06 DIAGNOSIS — N2581 Secondary hyperparathyroidism of renal origin: Secondary | ICD-10-CM | POA: Diagnosis not present

## 2020-11-06 DIAGNOSIS — D509 Iron deficiency anemia, unspecified: Secondary | ICD-10-CM | POA: Diagnosis not present

## 2020-11-06 DIAGNOSIS — E1121 Type 2 diabetes mellitus with diabetic nephropathy: Secondary | ICD-10-CM | POA: Diagnosis not present

## 2020-11-06 DIAGNOSIS — N186 End stage renal disease: Secondary | ICD-10-CM | POA: Diagnosis not present

## 2020-11-07 IMAGING — DX PORTABLE CHEST - 1 VIEW
1 series · 1 of 1 positions shown · non-contrast
Comparison: Radiograph September 14, 2018.

CLINICAL DATA: Hypoxia, acute dyspnea.

EXAM:
PORTABLE CHEST 1 VIEW

[chest ap]
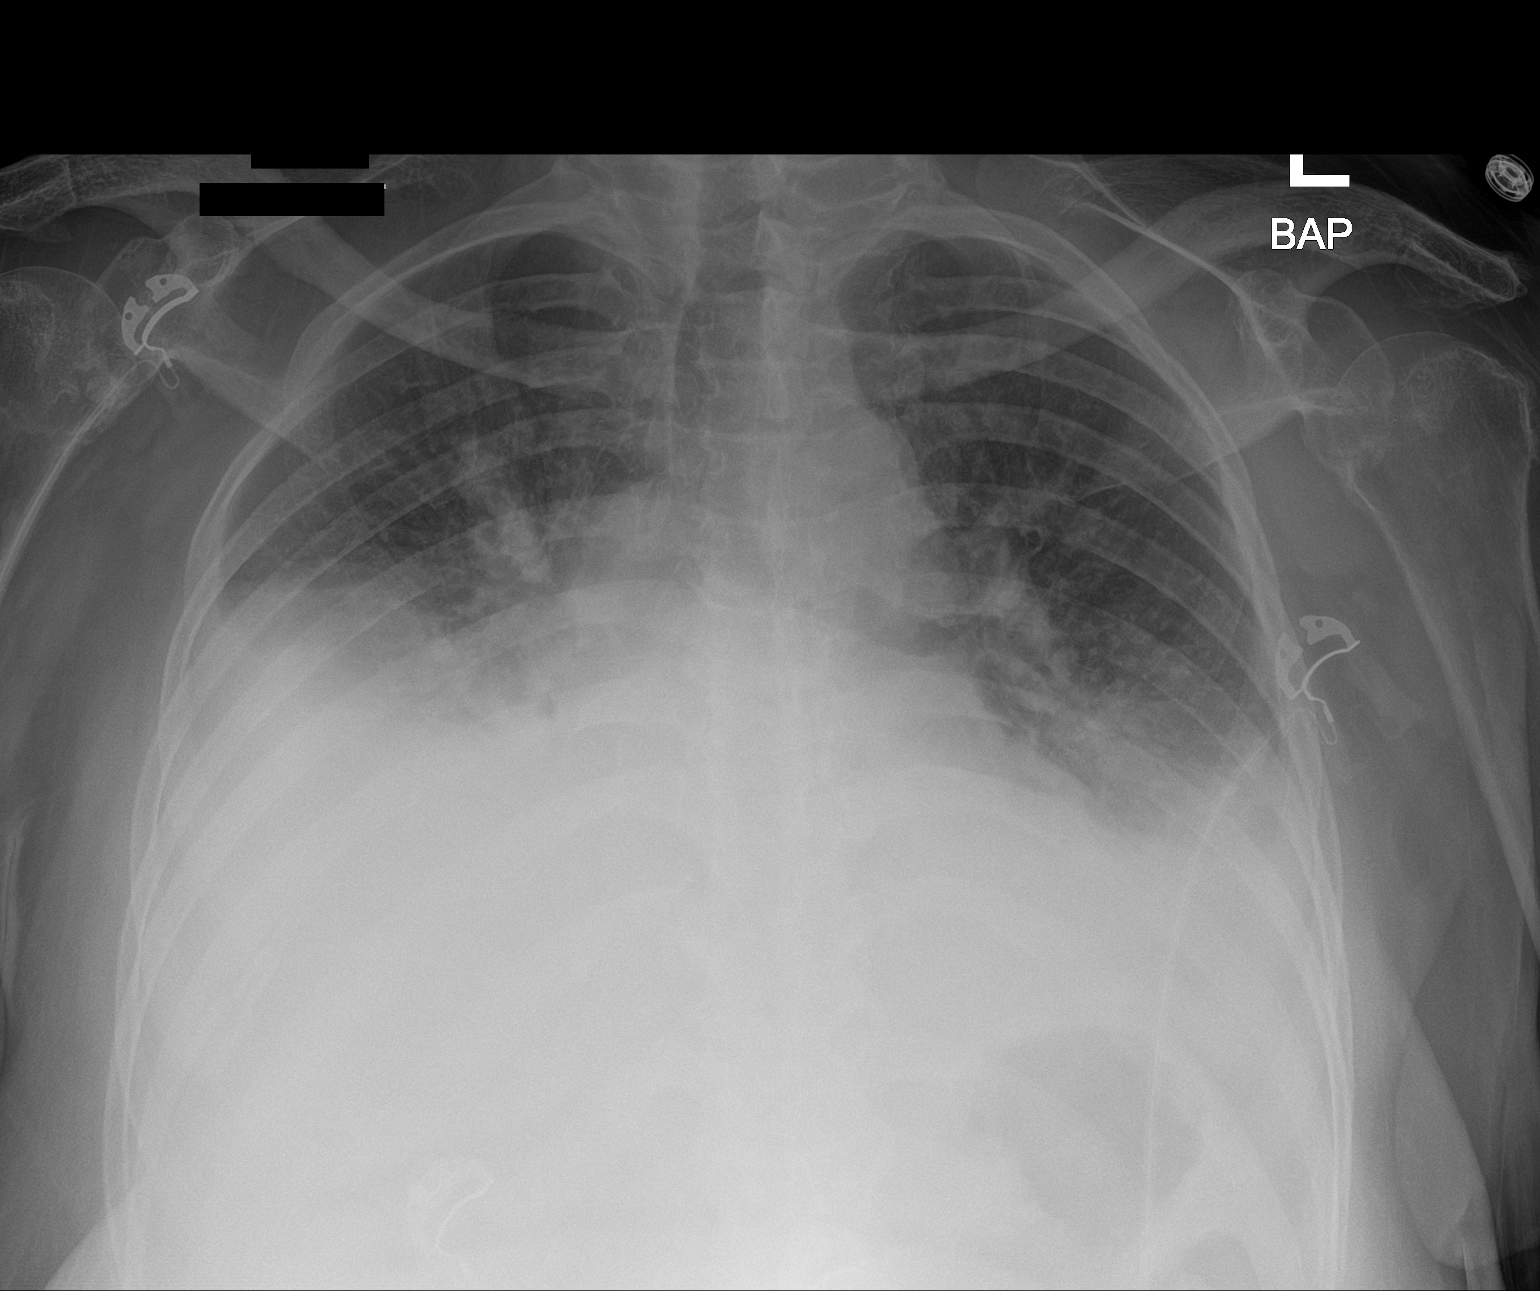

[1 of 1 positions shown; findings below may reference images not displayed]

FINDINGS: Stable cardiomediastinal silhouette. Moderate bilateral pleural
effusions are noted with associated atelectasis or edema. No
pneumothorax is noted. Bony thorax is unremarkable.
IMPRESSION: Interval development of moderate pleural effusions with associated
atelectasis or edema.

## 2020-11-09 DIAGNOSIS — E1121 Type 2 diabetes mellitus with diabetic nephropathy: Secondary | ICD-10-CM | POA: Diagnosis not present

## 2020-11-09 DIAGNOSIS — D509 Iron deficiency anemia, unspecified: Secondary | ICD-10-CM | POA: Diagnosis not present

## 2020-11-09 DIAGNOSIS — D631 Anemia in chronic kidney disease: Secondary | ICD-10-CM | POA: Diagnosis not present

## 2020-11-09 DIAGNOSIS — N186 End stage renal disease: Secondary | ICD-10-CM | POA: Diagnosis not present

## 2020-11-09 DIAGNOSIS — N2581 Secondary hyperparathyroidism of renal origin: Secondary | ICD-10-CM | POA: Diagnosis not present

## 2020-11-09 NOTE — Progress Notes (Signed)
Cardiology Clinic Note   Patient Name: Jenna Brown Date of Encounter: 11/10/2020  Primary Care Provider:  [name], MD Primary Cardiologist:  Skeet Latch, MD  Patient Profile    Jenna Brown 70 year old female presents the clinic today for follow-up evaluation of her PVD and essential hypertension.  Past Medical History    Past Medical History:  Diagnosis Date  . Anemia   . Anxiety    takes Xanax prn  . Arthritis   . Cataract    left eye  . CKD (chronic kidney disease), stage IV ([place])   . Coronary artery disease 2016   cath w/ 90% LAD, 95%CFX, 80% OM 2, 60% RCA, not CABG candidate, rx medically  . Full dentures   . GERD (gastroesophageal reflux disease)   . H/O hiatal hernia   . Headache(784.0)    when b/p is elevated  . Hemorrhoids   . Hx of AKA (above knee amputation), right ([place])   . Hyperlipidemia    takes Simvastatin daily  . Hypertension    takes Amlodipine/HCTZ/Losartan daily  . Migraines   . Myocardial infarction ([place]) 1990's  . NSTEMI (non-ST elevated myocardial infarction) ([place])   . Peripheral vascular disease ([place])   . PONV (postoperative nausea and vomiting)   . Stroke [facility])    TIA history  . Type II diabetes mellitus (HCC)    on Lantus  . Vertigo    takes Ativert prn   Past Surgical History:  Procedure Laterality Date  . ABDOMINAL AORTAGRAM N/A 02/29/2012   Procedure: ABDOMINAL Maxcine Ham;  Surgeon: [name], MD;  Location: [facility] CATH LAB;  Service: Cardiovascular;  Laterality: N/A;  . ABDOMINAL AORTOGRAM W/LOWER EXTREMITY N/A 02/11/2020   Procedure: ABDOMINAL AORTOGRAM W/LOWER EXTREMITY;  Surgeon: [name], MD;  Location: [place] CV LAB;  Service: Cardiovascular;  Laterality: N/A;  . ABDOMINAL AORTOGRAM W/LOWER EXTREMITY N/A 03/17/2020   Procedure: ABDOMINAL AORTOGRAM W/LOWER EXTREMITY;  Surgeon: [name], MD;  Location: [place] CV LAB;  Service: Cardiovascular;  Laterality: N/A;  . AMPUTATION   05/10/2012   Procedure: AMPUTATION ABOVE KNEE;  Surgeon: [name], MD;  Location: [place] OR;  Service: Vascular;  Laterality: Right;   open right groin wound noted  . aortogram  02/2012  . AV FISTULA PLACEMENT Right 03/04/2019   Procedure: ARTERIOVENOUS (AV) FISTULA CREATION;  Surgeon: Rosetta Posner, MD;  Location: [place];  Service: Vascular;  Laterality: Right;  . CARDIAC CATHETERIZATION N/A 04/13/2015   Procedure: Right/Left Heart Cath and Coronary Angiography;  Surgeon: [name], MD;  Location: [place] CV LAB;  Service: Cardiovascular;  Laterality: N/A;  . CLEFT PALATE REPAIR     several  . COLONOSCOPY    . DILATION AND CURETTAGE OF UTERUS    . FEMORAL-POPLITEAL BYPASS GRAFT  04/05/2012   Procedure: BYPASS GRAFT FEMORAL-POPLITEAL ARTERY;  Surgeon: [name], MD;  Location: [place];  Service: Vascular;  Laterality: Right;  REVISION  . FEMORAL-TIBIAL BYPASS GRAFT  03/29/2012   Procedure: BYPASS GRAFT FEMORAL-TIBIAL ARTERY;  Surgeon: [name], MD;  Location: [place] OR;  Service: Vascular;  Laterality: Right;  Right femoral to Posterior Tibialis with composite graft of 77m x 80 cm ringed gortex graft and saphenous vein  ,intraoperative arteriogram  . FEMOROPOPLITEAL THROMBECTOMY / EMBOLECTOMY  05/09/2012  . IR FLUORO GUIDE CV LINE RIGHT  03/01/2019  . IR [place] VASC ACCESS RIGHT  03/01/2019  . MULTIPLE TOOTH EXTRACTIONS    .  MYOMECTOMY    . PERIPHERAL VASCULAR INTERVENTION Left 03/17/2020   Procedure: PERIPHERAL VASCULAR INTERVENTION;  Surgeon: [name], MD;  Location: [place] CV LAB;  Service: Cardiovascular;  Laterality: Left;  popliteal  . PR VEIN BYPASS GRAFT,AORTO-FEM-POP  05/27/11   Right SFA-Below knee Pop BP  . REVISION OF ARTERIOVENOUS GORETEX GRAFT Right 08/21/2019   Procedure: REVISION OF ARTERIOVENOUS FISTULA RIGHT ARM;  Surgeon: Rosetta Posner, MD;  Location: [facility] OR;  Service: Vascular;  Laterality: Right;  . TEE WITHOUT CARDIOVERSION N/A 01/03/2017    Procedure: TRANSESOPHAGEAL ECHOCARDIOGRAM (TEE);  Surgeon: Skeet Latch, MD;  Location: [facility];  Service: Cardiovascular;  Laterality: N/A;  . THORACENTESIS  02/27/2019      . TONSILLECTOMY    . TUBAL LIGATION  ~ 1990    Allergies  Allergies  Allergen Reactions  . Plavix [Clopidogrel Bisulfate] Itching  . Codeine Other (See Comments)    "makes me feel strange"  . Tylenol With Codeine #3 [Acetaminophen-Codeine] Other (See Comments)    "doesn't make me feel right"    History of Present Illness    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  She was initially admitted 7/16 with dyspnea.  She underwent left and right heart catheterization and was found to have three-vessel CAD.  However, at that time she was not a candidate for CABG due to comorbidities and poor targets.  Of note she did require short-term milrinone therapy for cardiogenic shock.  She was diuresed and able to transition off of her milrinone.  She was admitted 4/18 with hypertensive emergency and acute renal failure.  Her troponin was elevated to 4.6.  It was felt to be related to demand ischemia.  She was treated with nitroglycerin and heparin drip.  She was again admitted on 7/18 with NSTEMI in the setting of not taking ticagrelor.  She was medically managed due to her chronic kidney disease.  She was admitted again with NSTEMI 4/19.  She was again medically managed due to her CKD.  She was again seen and evaluated by cardiothoracic surgery and still considered to be a poor surgical candidate.  She was admitted 8/19 with chest pain and NSTEMI.  Troponins peaked at 2.2.  She was evaluated by Dr. Martinique who recommended medical management and not pursuing cardiac catheterization.  She was seen in the heart failure clinic  11/19.  At that time she was feeling well.  She had an echocardiogram 11/19 showed an LVEF of 45-50%, hypokinesis of the apical inferior lateral, inferior and inferior septal as well as apical myocardium.  Her hydralazine was increased.  She was admitted 6/20 with chest pain and heart failure.  She had self discontinued ticagrelor 5/20 due to shortness of breath.  Her renal function progressed to end-stage renal disease and she was started on dialysis.  She had AV fistula placed 6/20.  She had bilateral pleural effusions and underwent right thoracentesis with removal of 1.2 L fluid.  She was seen by [name], DNP on 03/14/2019 and was doing well at that time.  She reported that she was presenting to be vascular center that day and hope to be able to start using her fistula the following week.  She was tolerating HD well.  She did however report that her blood pressure occasionally get low and she was  nearly passed out.  She reported this was when they would run her dialysis too fast.  Her blood pressure was well controlled.  She was not checking it often at home.  She denied lower extremity edema since starting hemodialysis.  She also reported she was happy to get off some of her medications.  She was seen by Dr. Oval Linsey on 07/28/2020.  During that time she was feeling okay.  Her main complaint pertains to going to dialysis.  She noted that her blood pressure would get low with dialysis and she would feel like she was going to faint.  She reported she does not take her blood pressure medication on hemodialysis days.  Since making the change with her blood pressure medication it had been more stable.  She denied chest pain and dyspnea.  She denied lower extremity edema, orthopnea, and PND.  She was frustrated that she had gained weight.  She reported that she had not been getting any exercise.  At that time she was also struggling with bursitis in her left knee.  She was following with Dr. Joesph July at [facility] nephrology.  She presents in the clinic today for follow-up evaluation states she feels well.  She reports that she had an episode of reflux that caused vomiting at dialysis.  She reports that she has these episodes 2-3 times per year.  The episodes may last 5 to 10 minutes and then dissipate without intervention or subside after vomiting.  She reports that her blood pressures at home been well controlled in the 130s over 70s.  She continues to not take her blood pressure medication on dialysis days.  She has had no further episodes with low blood pressure during hemodialysis.  She does report some tingling in her left arm.  This appears to be related to nerve pain.  We talked about the importance of maintaining good posture in sitting with good posture.  Reviewed exercises for scapular retraction/maintaining neutral posture.  I will give her GERD diet sheet, have her increase her physical activity as tolerated, and follow-up with Dr. Oval Linsey in 6 months.  Today she denies chest pain, shortness of breath, lower extremity edema, fatigue, palpitations, melena, hematuria, hemoptysis, diaphoresis, weakness, presyncope, syncope, orthopnea, and PND.   Home Medications    Prior to Admission medications   Medication Sig Start Date End Date Taking? Authorizing Provider  acetaminophen (TYLENOL) 500 MG tablet Take 500 mg by mouth every 6 (six) hours as needed for mild pain.    [provider]  ALPRAZolam Duanne Moron) 0.25 MG tablet Take 1 tablet (0.25 mg total) by mouth 3 (three) times daily as needed for anxiety. 01/03/17   Delos Haring, PA-C  amLODipine (NORVASC) 10 MG tablet Take 10 mg by mouth daily. 06/04/20   [provider]  aspirin 81 MG chewable tablet Chew 81 mg by mouth daily.    [provider]  atorvastatin (LIPITOR) 80 MG tablet Take 80 mg by mouth daily.  01/09/18   [provider]  carvedilol (COREG) 25 MG tablet Take 25 mg by mouth daily.     [provider]  cholecalciferol (VITAMIN D) 25 MCG (1000 UT) tablet Take 1,000 Units by mouth daily.    [provider]  docusate sodium (COLACE) 100 MG capsule Take 100 mg by mouth daily as needed for mild constipation.    [provider]  insulin glargine (LANTUS) 100 UNIT/ML injection Inject 0.2 mLs (20 Units total) into the skin at bedtime.  Patient taking differently: Inject 5 Units into the skin at bedtime.  03/06/19   Georgette Shell, MD  nitroGLYCERIN (NITROSTAT) 0.4 MG SL tablet PLACE 1 TABLET UNDER THE TONGUE EVERY 5 MINUTES FOR 3 DOSES AS NEEDED FOR CHEST PAIN 09/29/20   Skeet Latch, MD  RENVELA 800 MG tablet Take 800 mg by mouth 3 (three) times daily with meals.     [provider]  ticagrelor (BRILINTA) 90 MG TABS tablet Take 1 tablet (90 mg total) by mouth 2 (two) times daily. 07/13/20   [name], MD    Family History    Family History  Problem Relation Age of Onset  . Cancer Mother        breast  . Diabetes Father   . Cancer Brother   . Heart attack Maternal Grandmother   . Heart attack Maternal Grandfather    She indicated that her mother is deceased. She indicated that her father is deceased. She indicated that her brother is deceased. She indicated that her maternal grandmother is deceased. She indicated that her maternal grandfather is deceased. She indicated that her paternal grandmother is deceased. She indicated that her paternal grandfather is deceased.  Social History    Social History   Socioeconomic History  . Marital status: Single    Spouse name: Not on file  . Number of children: Not on file  . Years of education: Not on file  . Highest education level: Not on file  Occupational History  . Occupation: disabled  Tobacco Use  . Smoking status: Former Smoker    Packs/day: 0.25    Years: 40.00    Pack years: 10.00    Types: Cigarettes    Quit date: 02/27/2012    Years since quitting: 8.7  . Smokeless  tobacco: Never Used  Vaping Use  . Vaping Use: Never used  Substance and Sexual Activity  . Alcohol use: No  . Drug use: No  . Sexual activity: Never    Birth control/protection: Post-menopausal  Other Topics Concern  . Not on file  Social History Narrative   She lives in [place] with family.   Social Determinants of Health   Financial Resource Strain: Not on file  Food Insecurity: Not on file  Transportation Needs: Not on file  Physical Activity: Not on file  Stress: Not on file  Social Connections: Not on file  Intimate Partner Violence: Not on file     Review of Systems    General:  No chills, fever, night sweats or weight changes.  Cardiovascular:  No chest pain, dyspnea on exertion, edema, orthopnea, palpitations, paroxysmal nocturnal dyspnea. Dermatological: No rash, lesions/masses Respiratory: No cough, dyspnea Urologic: No hematuria, dysuria Abdominal:   No nausea, vomiting, diarrhea, bright red blood per rectum, melena, or hematemesis Neurologic:  No visual changes, wkns, changes in mental status. All other systems reviewed and are otherwise negative except as noted above.  Physical Exam    VS:  BP (!) 150/70   Pulse 90   Ht '5\' 4"'$  (1.626 m)   Wt 152 lb (68.9 kg)   SpO2 95%   BMI 26.09 kg/m  , BMI Body mass index is 26.09 kg/m. GEN: Well nourished, well developed, in no acute distress. HEENT: normal. Neck: Supple, no JVD, carotid bruits, or masses. Cardiac: RRR, no murmurs, rubs, or gallops. No clubbing, cyanosis, edema.  Radials/DP/PT 2+ and equal bilaterally.  Respiratory:  Respirations regular and unlabored, clear to auscultation bilaterally. GI: Soft, nontender, nondistended, BS +  x 4. MS: no deformity or atrophy. Skin: warm and dry, no rash. Neuro:  Strength and sensation are intact. Psych: Normal affect.  Accessory Clinical Findings    Recent Labs: 03/17/2020: BUN 34; Creatinine, Ser 4.60; Hemoglobin 10.9; Potassium 4.6; Sodium 136   Recent  Lipid Panel    Component Value Date/Time   CHOL 162 09/15/2018 0522   TRIG 51 09/15/2018 0522   HDL 45 09/15/2018 0522   CHOLHDL 3.6 09/15/2018 0522   VLDL 10 09/15/2018 0522   LDLCALC 107 (H) 09/15/2018 0522    ECG personally reviewed by me today-normal sinus rhythm left ventricular hypertrophy with repolarization abnormality anterior infarct undetermined age 71 bpm- No acute changes  EKG 11/16/2018 Sinus rhythm prior infarct undetermined age diffuse T wave abnormality 91 bpm  Echocardiogram 11/19 - Left ventricle: The cavity size was normal. There was mild focal basal hypertrophy of the septum. Systolic function was mildly reduced. The estimated ejection fraction was in the range of 45% to 50%. Hypokinesis of the apicalinferolateral, inferior, inferoseptal, and apical myocardium. - Aortic valve: Trileaflet; mildly thickened, mildly calcified leaflets. Left coronary cusp mobility was moderately restricted. - Mitral valve: Calcified annulus. Mildly thickened leaflets . There was trivial regurgitation. - Right ventricle: The cavity size was mildly dilated. Wall thickness was normal. Systolic function was low normal. - Atrial septum: No defect or patent foramen ovale was identified. - Tricuspid valve: There was no significant regurgitation. - Pulmonic valve: There was trivial regurgitation.  Impressions:  - EF using wall tracking approximately 45%. Hypokinesis of distal inferior, inferolateral, inferoseptal, and apical walls. Wall motion visually appears similar to prior. Thickened LCC of aortic valve appears similar to prior.  Assessment & Plan   1.  Coronary artery disease-no chest pain today.  Has had recurrent NSTEMI and is not a candidate for CABG due to poor targets and comorbidities.  Has chronic stable angina.  Reports that she had an episode of reflux at HD.  Has episodes of reflux 2-3 times per year Continue aspirin, atorvastatin, carvedilol,  Brilinta Heart healthy low-sodium diet-salty 6 given Increase physical activity as tolerated Give GERD diet information  Hyperlipidemia-LDL 107 on 09/15/2018 Continue atorvastatin Heart healthy low-sodium high-fiber diet Increase physical activity as tolerated Follows with PCP  Essential hypertension-BP today 150/70.  Reports 130s over 70s at home.  Not taking antihypertensive medications on HD days.  Blood pressure has remained stable. Continue carvedilol, amlodipine Heart healthy low-sodium diet-salty 6 given Increase physical activity as tolerated  Chronic systolic and diastolic CHF-no increased DOE or activity intolerance.  Echocardiogram showed LVEF of 45-50%. Continue carvedilol Heart healthy low-sodium diet-salty 6 given Increase physical activity as tolerated  End-stage renal disease-on hemodialysis Monday Wednesday Friday.  Reports she is tolerating well as long as her blood pressure stable.  Disposition: Follow-up with Dr. Oval Linsey or me in 6 months.  Jossie Ng. Jenipher Havel NP-C    11/10/2020, 8:44 AM [place] [address] Office [phone] Fax [phone]  Notice: This dictation was prepared with Dragon dictation along with smaller phrase technology. Any transcriptional errors that result from this process are unintentional and may not be corrected upon review.  I spent 10 minutes examining this patient, reviewing medications, and using patient centered shared decision making involving her cardiac care.  Prior to her visit I spent greater than 20 minutes reviewing her past medical history,  medications, and prior cardiac tests.

## 2020-11-10 ENCOUNTER — Other Ambulatory Visit: Payer: Self-pay

## 2020-11-10 ENCOUNTER — Encounter: Payer: Self-pay | Admitting: General Practice

## 2020-11-10 ENCOUNTER — Ambulatory Visit (INDEPENDENT_AMBULATORY_CARE_PROVIDER_SITE_OTHER): Payer: Medicare Other | Admitting: General Practice

## 2020-11-10 DIAGNOSIS — I5042 Chronic combined systolic (congestive) and diastolic (congestive) heart failure: Secondary | ICD-10-CM | POA: Diagnosis not present

## 2020-11-10 DIAGNOSIS — I10 Essential (primary) hypertension: Secondary | ICD-10-CM

## 2020-11-10 DIAGNOSIS — Z992 Dependence on renal dialysis: Secondary | ICD-10-CM

## 2020-11-10 DIAGNOSIS — E785 Hyperlipidemia, unspecified: Secondary | ICD-10-CM | POA: Diagnosis not present

## 2020-11-10 DIAGNOSIS — N186 End stage renal disease: Secondary | ICD-10-CM

## 2020-11-10 DIAGNOSIS — I2510 Atherosclerotic heart disease of native coronary artery without angina pectoris: Secondary | ICD-10-CM

## 2020-11-10 IMAGING — XA IR RIGHT FLUORO GUIDE CV LINE
1 series · 1 of 1 positions shown · non-contrast
Comparison: none

INDICATION: END-STAGE RENAL DISEASE

[Series 1: fl(-)  angio sharp · 1 of 1 slices shown]
[im 1/1]
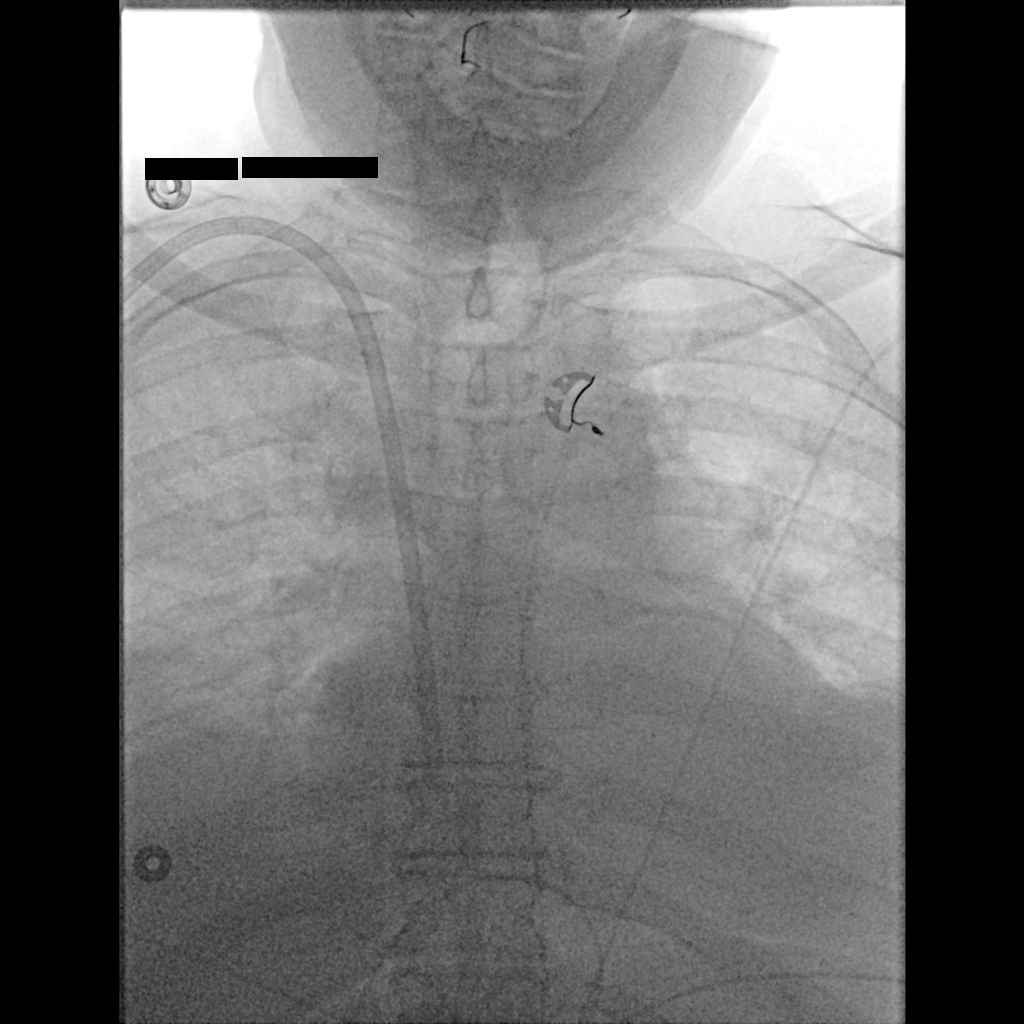

[1 of 1 positions shown; findings below may reference images not displayed]

EXAM:
ULTRASOUND GUIDANCE FOR VASCULAR ACCESS

RIGHT INTERNAL JUGULAR PERMANENT HEMODIALYSIS CATHETER

Date:  03/01/2019 03/01/2019 [DATE]

Radiologist:  Usham, Ablho

Guidance:  Ultrasound fluoroscopic

FLUOROSCOPY TIME:  Fluoroscopy Time: 0 minutes 54 seconds (5 mGy).

MEDICATIONS:
Ancef 2 g within 1 hour of the procedure

ANESTHESIA/SEDATION:
Versed 1 mg IV; Fentanyl 25 mcg IV;

Moderate Sedation Time:  15 minutes

The patient was continuously monitored during the procedure by the
interventional radiology nurse under my direct supervision.

CONTRAST:  None.

COMPLICATIONS:
None immediate.

PROCEDURE:
Informed consent was obtained from the patient following explanation
of the procedure, risks, benefits and alternatives. The patient
understands, agrees and consents for the procedure. All questions
were addressed. A time out was performed.

Maximal barrier sterile technique utilized including caps, mask,
sterile gowns, sterile gloves, large sterile drape, hand hygiene,
and 2% chlorhexidine scrub.

Under sterile conditions and local anesthesia, right internal
jugular micropuncture venous access was performed with ultrasound.
Images were obtained for documentation of the patent right internal
jugular vein. A guide wire was inserted followed by a transitional
dilator. Next, a 0.035 guidewire was advanced into the IVC with a
5-French catheter. Measurements were obtained from the right
venotomy site to the proximal right atrium. In the right
infraclavicular chest, a subcutaneous tunnel was created under
sterile conditions and local anesthesia. 1% lidocaine with
epinephrine was utilized for this. The 19 cm tip to cuff dialysis
catheter was tunneled subcutaneously to the venotomy site and
inserted into the SVC/RA junction through a valved peel-away sheath.
Position was confirmed with fluoroscopy. Images were obtained for
documentation. Blood was aspirated from the catheter followed by
saline and heparin flushes. The appropriate volume and strength of
heparin was instilled in each lumen. Caps were applied. The catheter
was secured at the tunnel site with Gelfoam and a pursestring
suture. The venotomy site was closed with subcuticular Vicryl
suture. Dermabond was applied to the small right neck incision. A
dry sterile dressing was applied. The catheter is ready for use. No
immediate complications.
IMPRESSION: Ultrasound and fluoroscopically guided right internal jugular
tunneled hemodialysis catheter (19 cm tip to cuff dialysis
catheter).

## 2020-11-10 NOTE — Patient Instructions (Signed)
Medication Instructions:  The current medical regimen is effective;  continue present plan and medications as directed. Please refer to the Current Medication list given to you today.  *If you need a refill on your cardiac medications before your next appointment, please call your pharmacy*  Lab Work:   Testing/Procedures:  NONE    NONE  Special Instructions PLEASE WORK ON GOOD POSTURE  PLEASE READ AND FOLLOW GERD DIET-ATTACHED-1,'800mg'$  daily  PLEASE INCREASE PHYSICAL ACTIVITY AS TOLERATED  Follow-Up: Your next appointment:  6 month(s) In Person with Skeet Latch, MD ONLY  Please call our office 2 months in advance to schedule this appointment   At [facility], you and your health needs are our priority.  As part of our continuing mission to provide you with exceptional heart care, we have created designated Provider Care Teams.  These Care Teams include your primary Cardiologist (physician) and Advanced Practice Providers (APPs -  Physician Assistants and Nurse Practitioners) who all work together to provide you with the care you need, when you need it.  We recommend signing up for the patient portal called "MyChart".  Sign up information is provided on this After Visit Summary.  MyChart is used to connect with patients for Virtual Visits (Telemedicine).  Patients are able to view lab/test results, encounter notes, upcoming appointments, etc.  Non-urgent messages can be sent to your provider as well.   To learn more about what you can do with MyChart, go to [email or web address].      Food Choices for Gastroesophageal Reflux Disease, Adult When you have gastroesophageal reflux disease (GERD), the foods you eat and your eating habits are very important. Choosing the right foods can help ease your discomfort. Think about working with a food expert (dietitian) to help you make good choices. What are tips for following this plan? Reading food labels  Look for foods that are low  in saturated fat. Foods that may help with your symptoms include: ? Foods that have less than 5% of daily value (DV) of fat. ? Foods that have 0 grams of trans fat. Cooking  Do not fry your food.  Cook your food by baking, steaming, grilling, or broiling. These are all methods that do not need a lot of fat for cooking.  To add flavor, try to use herbs that are low in spice and acidity. Meal planning  Choose healthy foods that are low in fat, such as: ? Fruits and vegetables. ? Whole grains. ? Low-fat dairy products. ? Lean meats, fish, and poultry.  Eat small meals often instead of eating 3 large meals each day. Eat your meals slowly in a place where you are relaxed. Avoid bending over or lying down until 2-3 hours after eating.  Limit high-fat foods such as fatty meats or fried foods.  Limit your intake of fatty foods, such as oils, butter, and shortening.  Avoid the following as told by your doctor: ? Foods that cause symptoms. These may be different for different people. Keep a food diary to keep track of foods that cause symptoms. ? Alcohol. ? Drinking a lot of liquid with meals. ? Eating meals during the 2-3 hours before bed.   Lifestyle  Stay at a healthy weight. Ask your doctor what weight is healthy for you. If you need to lose weight, work with your doctor to do so safely.  Exercise for at least 30 minutes on 5 or more days each week, or as told by your doctor.  Wear loose-fitting clothes.  Do not smoke or use any products that contain nicotine or tobacco. If you need help quitting, ask your doctor.  Sleep with the head of your bed higher than your feet. Use a wedge under the mattress or blocks under the bed frame to raise the head of the bed.  Chew sugar-free gum after meals. What foods should eat? Eat a healthy, well-balanced diet of fruits, vegetables, whole grains, low-fat dairy products, lean meats, fish, and poultry. Each person is different. Foods that may  cause symptoms in one person may not cause any symptoms in another person. Work with your doctor to find foods that are safe for you. The items listed above may not be a complete list of what you can eat and drink. Contact a food expert for more options.   What foods should I avoid? Limiting some of these foods may help in managing the symptoms of GERD. Everyone is different. Talk with a food expert or your doctor to help you find the exact foods to avoid, if any. Fruits Any fruits prepared with added fat. Any fruits that cause symptoms. For some people, this may include citrus fruits, such as oranges, grapefruit, pineapple, and lemons. Vegetables Deep-fried vegetables. [place] fries. Any vegetables prepared with added fat. Any vegetables that cause symptoms. For some people, this may include tomatoes and tomato products, chili peppers, onions and garlic, and horseradish. Grains Pastries or quick breads with added fat. Meats and other proteins High-fat meats, such as fatty beef or pork, hot dogs, ribs, ham, sausage, salami, and bacon. Fried meat or protein, including fried fish and fried chicken. Nuts and nut butters, in large amounts. Dairy Whole milk and chocolate milk. Sour cream. Cream. Ice cream. Cream cheese. Milkshakes. Fats and oils Butter. Margarine. Shortening. Ghee. Beverages Coffee and tea, with or without caffeine. Carbonated beverages. Sodas. Energy drinks. Fruit juice made with acidic fruits, such as orange or grapefruit. Tomato juice. Alcoholic drinks. Sweets and desserts Chocolate and cocoa. Donuts. Seasonings and condiments Pepper. Peppermint and spearmint. Added salt. Any condiments, herbs, or seasonings that cause symptoms. For some people, this may include curry, hot sauce, or vinegar-based salad dressings. The items listed above may not be a complete list of what you should not eat and drink. Contact a food expert for more options. Questions to ask your doctor Diet and  lifestyle changes are often the first steps that are taken to manage symptoms of GERD. If diet and lifestyle changes do not help, talk with your doctor about taking medicines. Where to find more information  International Foundation for Gastrointestinal Disorders: aboutgerd.org Summary  When you have GERD, food and lifestyle choices are very important in easing your symptoms.  Eat small meals often instead of 3 large meals a day. Eat your meals slowly and in a place where you are relaxed.  Avoid bending over or lying down until 2-3 hours after eating.  Limit high-fat foods such as fatty meats or fried foods. This information is not intended to replace advice given to you by your health care provider. Make sure you discuss any questions you have with your health care provider. Document Revised: 03/16/2020 Document Reviewed: 03/16/2020 Elsevier Patient Education  [place].

## 2020-11-11 DIAGNOSIS — N390 Urinary tract infection, site not specified: Secondary | ICD-10-CM | POA: Diagnosis not present

## 2020-11-11 DIAGNOSIS — D509 Iron deficiency anemia, unspecified: Secondary | ICD-10-CM | POA: Diagnosis not present

## 2020-11-11 DIAGNOSIS — D631 Anemia in chronic kidney disease: Secondary | ICD-10-CM | POA: Diagnosis not present

## 2020-11-11 DIAGNOSIS — N2581 Secondary hyperparathyroidism of renal origin: Secondary | ICD-10-CM | POA: Diagnosis not present

## 2020-11-11 DIAGNOSIS — E1121 Type 2 diabetes mellitus with diabetic nephropathy: Secondary | ICD-10-CM | POA: Diagnosis not present

## 2020-11-11 DIAGNOSIS — N186 End stage renal disease: Secondary | ICD-10-CM | POA: Diagnosis not present

## 2020-11-13 DIAGNOSIS — N186 End stage renal disease: Secondary | ICD-10-CM | POA: Diagnosis not present

## 2020-11-13 DIAGNOSIS — N2581 Secondary hyperparathyroidism of renal origin: Secondary | ICD-10-CM | POA: Diagnosis not present

## 2020-11-13 DIAGNOSIS — E1121 Type 2 diabetes mellitus with diabetic nephropathy: Secondary | ICD-10-CM | POA: Diagnosis not present

## 2020-11-13 DIAGNOSIS — D509 Iron deficiency anemia, unspecified: Secondary | ICD-10-CM | POA: Diagnosis not present

## 2020-11-13 DIAGNOSIS — D631 Anemia in chronic kidney disease: Secondary | ICD-10-CM | POA: Diagnosis not present

## 2020-11-16 DIAGNOSIS — D631 Anemia in chronic kidney disease: Secondary | ICD-10-CM | POA: Diagnosis not present

## 2020-11-16 DIAGNOSIS — N2581 Secondary hyperparathyroidism of renal origin: Secondary | ICD-10-CM | POA: Diagnosis not present

## 2020-11-16 DIAGNOSIS — N186 End stage renal disease: Secondary | ICD-10-CM | POA: Diagnosis not present

## 2020-11-16 DIAGNOSIS — E1121 Type 2 diabetes mellitus with diabetic nephropathy: Secondary | ICD-10-CM | POA: Diagnosis not present

## 2020-11-16 DIAGNOSIS — Z992 Dependence on renal dialysis: Secondary | ICD-10-CM | POA: Diagnosis not present

## 2020-11-16 DIAGNOSIS — D509 Iron deficiency anemia, unspecified: Secondary | ICD-10-CM | POA: Diagnosis not present

## 2020-11-18 DIAGNOSIS — Z1159 Encounter for screening for other viral diseases: Secondary | ICD-10-CM | POA: Diagnosis not present

## 2020-11-18 DIAGNOSIS — E1121 Type 2 diabetes mellitus with diabetic nephropathy: Secondary | ICD-10-CM | POA: Diagnosis not present

## 2020-11-18 DIAGNOSIS — N186 End stage renal disease: Secondary | ICD-10-CM | POA: Diagnosis not present

## 2020-11-18 DIAGNOSIS — D509 Iron deficiency anemia, unspecified: Secondary | ICD-10-CM | POA: Diagnosis not present

## 2020-11-18 DIAGNOSIS — N2581 Secondary hyperparathyroidism of renal origin: Secondary | ICD-10-CM | POA: Diagnosis not present

## 2020-11-18 DIAGNOSIS — D631 Anemia in chronic kidney disease: Secondary | ICD-10-CM | POA: Diagnosis not present

## 2020-11-20 DIAGNOSIS — D509 Iron deficiency anemia, unspecified: Secondary | ICD-10-CM | POA: Diagnosis not present

## 2020-11-20 DIAGNOSIS — E1121 Type 2 diabetes mellitus with diabetic nephropathy: Secondary | ICD-10-CM | POA: Diagnosis not present

## 2020-11-20 DIAGNOSIS — D631 Anemia in chronic kidney disease: Secondary | ICD-10-CM | POA: Diagnosis not present

## 2020-11-20 DIAGNOSIS — N186 End stage renal disease: Secondary | ICD-10-CM | POA: Diagnosis not present

## 2020-11-20 DIAGNOSIS — N2581 Secondary hyperparathyroidism of renal origin: Secondary | ICD-10-CM | POA: Diagnosis not present

## 2020-11-23 DIAGNOSIS — D631 Anemia in chronic kidney disease: Secondary | ICD-10-CM | POA: Diagnosis not present

## 2020-11-23 DIAGNOSIS — N186 End stage renal disease: Secondary | ICD-10-CM | POA: Diagnosis not present

## 2020-11-23 DIAGNOSIS — D509 Iron deficiency anemia, unspecified: Secondary | ICD-10-CM | POA: Diagnosis not present

## 2020-11-23 DIAGNOSIS — E1121 Type 2 diabetes mellitus with diabetic nephropathy: Secondary | ICD-10-CM | POA: Diagnosis not present

## 2020-11-23 DIAGNOSIS — N2581 Secondary hyperparathyroidism of renal origin: Secondary | ICD-10-CM | POA: Diagnosis not present

## 2020-11-25 DIAGNOSIS — N2581 Secondary hyperparathyroidism of renal origin: Secondary | ICD-10-CM | POA: Diagnosis not present

## 2020-11-25 DIAGNOSIS — D631 Anemia in chronic kidney disease: Secondary | ICD-10-CM | POA: Diagnosis not present

## 2020-11-25 DIAGNOSIS — E1121 Type 2 diabetes mellitus with diabetic nephropathy: Secondary | ICD-10-CM | POA: Diagnosis not present

## 2020-11-25 DIAGNOSIS — N186 End stage renal disease: Secondary | ICD-10-CM | POA: Diagnosis not present

## 2020-11-25 DIAGNOSIS — D509 Iron deficiency anemia, unspecified: Secondary | ICD-10-CM | POA: Diagnosis not present

## 2020-11-27 DIAGNOSIS — N186 End stage renal disease: Secondary | ICD-10-CM | POA: Diagnosis not present

## 2020-11-27 DIAGNOSIS — N2581 Secondary hyperparathyroidism of renal origin: Secondary | ICD-10-CM | POA: Diagnosis not present

## 2020-11-27 DIAGNOSIS — E1121 Type 2 diabetes mellitus with diabetic nephropathy: Secondary | ICD-10-CM | POA: Diagnosis not present

## 2020-11-27 DIAGNOSIS — D509 Iron deficiency anemia, unspecified: Secondary | ICD-10-CM | POA: Diagnosis not present

## 2020-11-27 DIAGNOSIS — D631 Anemia in chronic kidney disease: Secondary | ICD-10-CM | POA: Diagnosis not present

## 2020-11-30 DIAGNOSIS — E1121 Type 2 diabetes mellitus with diabetic nephropathy: Secondary | ICD-10-CM | POA: Diagnosis not present

## 2020-11-30 DIAGNOSIS — N186 End stage renal disease: Secondary | ICD-10-CM | POA: Diagnosis not present

## 2020-11-30 DIAGNOSIS — N2581 Secondary hyperparathyroidism of renal origin: Secondary | ICD-10-CM | POA: Diagnosis not present

## 2020-11-30 DIAGNOSIS — D509 Iron deficiency anemia, unspecified: Secondary | ICD-10-CM | POA: Diagnosis not present

## 2020-11-30 DIAGNOSIS — D631 Anemia in chronic kidney disease: Secondary | ICD-10-CM | POA: Diagnosis not present

## 2020-12-02 DIAGNOSIS — E1121 Type 2 diabetes mellitus with diabetic nephropathy: Secondary | ICD-10-CM | POA: Diagnosis not present

## 2020-12-02 DIAGNOSIS — N2581 Secondary hyperparathyroidism of renal origin: Secondary | ICD-10-CM | POA: Diagnosis not present

## 2020-12-02 DIAGNOSIS — D509 Iron deficiency anemia, unspecified: Secondary | ICD-10-CM | POA: Diagnosis not present

## 2020-12-02 DIAGNOSIS — D631 Anemia in chronic kidney disease: Secondary | ICD-10-CM | POA: Diagnosis not present

## 2020-12-02 DIAGNOSIS — N186 End stage renal disease: Secondary | ICD-10-CM | POA: Diagnosis not present

## 2020-12-04 DIAGNOSIS — D509 Iron deficiency anemia, unspecified: Secondary | ICD-10-CM | POA: Diagnosis not present

## 2020-12-04 DIAGNOSIS — N186 End stage renal disease: Secondary | ICD-10-CM | POA: Diagnosis not present

## 2020-12-04 DIAGNOSIS — D631 Anemia in chronic kidney disease: Secondary | ICD-10-CM | POA: Diagnosis not present

## 2020-12-04 DIAGNOSIS — N2581 Secondary hyperparathyroidism of renal origin: Secondary | ICD-10-CM | POA: Diagnosis not present

## 2020-12-04 DIAGNOSIS — E1121 Type 2 diabetes mellitus with diabetic nephropathy: Secondary | ICD-10-CM | POA: Diagnosis not present

## 2020-12-07 DIAGNOSIS — E1121 Type 2 diabetes mellitus with diabetic nephropathy: Secondary | ICD-10-CM | POA: Diagnosis not present

## 2020-12-07 DIAGNOSIS — N186 End stage renal disease: Secondary | ICD-10-CM | POA: Diagnosis not present

## 2020-12-07 DIAGNOSIS — D631 Anemia in chronic kidney disease: Secondary | ICD-10-CM | POA: Diagnosis not present

## 2020-12-07 DIAGNOSIS — N2581 Secondary hyperparathyroidism of renal origin: Secondary | ICD-10-CM | POA: Diagnosis not present

## 2020-12-07 DIAGNOSIS — D509 Iron deficiency anemia, unspecified: Secondary | ICD-10-CM | POA: Diagnosis not present

## 2020-12-08 DIAGNOSIS — N390 Urinary tract infection, site not specified: Secondary | ICD-10-CM | POA: Diagnosis not present

## 2020-12-09 DIAGNOSIS — N186 End stage renal disease: Secondary | ICD-10-CM | POA: Diagnosis not present

## 2020-12-09 DIAGNOSIS — E1121 Type 2 diabetes mellitus with diabetic nephropathy: Secondary | ICD-10-CM | POA: Diagnosis not present

## 2020-12-09 DIAGNOSIS — D509 Iron deficiency anemia, unspecified: Secondary | ICD-10-CM | POA: Diagnosis not present

## 2020-12-09 DIAGNOSIS — N2581 Secondary hyperparathyroidism of renal origin: Secondary | ICD-10-CM | POA: Diagnosis not present

## 2020-12-09 DIAGNOSIS — D631 Anemia in chronic kidney disease: Secondary | ICD-10-CM | POA: Diagnosis not present

## 2020-12-11 DIAGNOSIS — N186 End stage renal disease: Secondary | ICD-10-CM | POA: Diagnosis not present

## 2020-12-11 DIAGNOSIS — D509 Iron deficiency anemia, unspecified: Secondary | ICD-10-CM | POA: Diagnosis not present

## 2020-12-11 DIAGNOSIS — E1121 Type 2 diabetes mellitus with diabetic nephropathy: Secondary | ICD-10-CM | POA: Diagnosis not present

## 2020-12-11 DIAGNOSIS — N2581 Secondary hyperparathyroidism of renal origin: Secondary | ICD-10-CM | POA: Diagnosis not present

## 2020-12-11 DIAGNOSIS — D631 Anemia in chronic kidney disease: Secondary | ICD-10-CM | POA: Diagnosis not present

## 2020-12-14 DIAGNOSIS — D631 Anemia in chronic kidney disease: Secondary | ICD-10-CM | POA: Diagnosis not present

## 2020-12-14 DIAGNOSIS — D509 Iron deficiency anemia, unspecified: Secondary | ICD-10-CM | POA: Diagnosis not present

## 2020-12-14 DIAGNOSIS — N2581 Secondary hyperparathyroidism of renal origin: Secondary | ICD-10-CM | POA: Diagnosis not present

## 2020-12-14 DIAGNOSIS — N186 End stage renal disease: Secondary | ICD-10-CM | POA: Diagnosis not present

## 2020-12-14 DIAGNOSIS — E1121 Type 2 diabetes mellitus with diabetic nephropathy: Secondary | ICD-10-CM | POA: Diagnosis not present

## 2020-12-16 DIAGNOSIS — N2581 Secondary hyperparathyroidism of renal origin: Secondary | ICD-10-CM | POA: Diagnosis not present

## 2020-12-16 DIAGNOSIS — E1121 Type 2 diabetes mellitus with diabetic nephropathy: Secondary | ICD-10-CM | POA: Diagnosis not present

## 2020-12-16 DIAGNOSIS — N186 End stage renal disease: Secondary | ICD-10-CM | POA: Diagnosis not present

## 2020-12-16 DIAGNOSIS — D631 Anemia in chronic kidney disease: Secondary | ICD-10-CM | POA: Diagnosis not present

## 2020-12-16 DIAGNOSIS — D509 Iron deficiency anemia, unspecified: Secondary | ICD-10-CM | POA: Diagnosis not present

## 2020-12-17 DIAGNOSIS — Z992 Dependence on renal dialysis: Secondary | ICD-10-CM | POA: Diagnosis not present

## 2020-12-17 DIAGNOSIS — N186 End stage renal disease: Secondary | ICD-10-CM | POA: Diagnosis not present

## 2020-12-18 DIAGNOSIS — N2581 Secondary hyperparathyroidism of renal origin: Secondary | ICD-10-CM | POA: Diagnosis not present

## 2020-12-18 DIAGNOSIS — E1121 Type 2 diabetes mellitus with diabetic nephropathy: Secondary | ICD-10-CM | POA: Diagnosis not present

## 2020-12-18 DIAGNOSIS — N186 End stage renal disease: Secondary | ICD-10-CM | POA: Diagnosis not present

## 2020-12-18 DIAGNOSIS — D631 Anemia in chronic kidney disease: Secondary | ICD-10-CM | POA: Diagnosis not present

## 2020-12-18 DIAGNOSIS — E785 Hyperlipidemia, unspecified: Secondary | ICD-10-CM | POA: Diagnosis not present

## 2020-12-18 DIAGNOSIS — D509 Iron deficiency anemia, unspecified: Secondary | ICD-10-CM | POA: Diagnosis not present

## 2020-12-21 DIAGNOSIS — N186 End stage renal disease: Secondary | ICD-10-CM | POA: Diagnosis not present

## 2020-12-21 DIAGNOSIS — E1121 Type 2 diabetes mellitus with diabetic nephropathy: Secondary | ICD-10-CM | POA: Diagnosis not present

## 2020-12-21 DIAGNOSIS — N2581 Secondary hyperparathyroidism of renal origin: Secondary | ICD-10-CM | POA: Diagnosis not present

## 2020-12-21 DIAGNOSIS — E785 Hyperlipidemia, unspecified: Secondary | ICD-10-CM | POA: Diagnosis not present

## 2020-12-21 DIAGNOSIS — D631 Anemia in chronic kidney disease: Secondary | ICD-10-CM | POA: Diagnosis not present

## 2020-12-21 DIAGNOSIS — D509 Iron deficiency anemia, unspecified: Secondary | ICD-10-CM | POA: Diagnosis not present

## 2020-12-23 DIAGNOSIS — E785 Hyperlipidemia, unspecified: Secondary | ICD-10-CM | POA: Diagnosis not present

## 2020-12-23 DIAGNOSIS — E1121 Type 2 diabetes mellitus with diabetic nephropathy: Secondary | ICD-10-CM | POA: Diagnosis not present

## 2020-12-23 DIAGNOSIS — D631 Anemia in chronic kidney disease: Secondary | ICD-10-CM | POA: Diagnosis not present

## 2020-12-23 DIAGNOSIS — D509 Iron deficiency anemia, unspecified: Secondary | ICD-10-CM | POA: Diagnosis not present

## 2020-12-23 DIAGNOSIS — N2581 Secondary hyperparathyroidism of renal origin: Secondary | ICD-10-CM | POA: Diagnosis not present

## 2020-12-23 DIAGNOSIS — N186 End stage renal disease: Secondary | ICD-10-CM | POA: Diagnosis not present

## 2020-12-25 DIAGNOSIS — E1121 Type 2 diabetes mellitus with diabetic nephropathy: Secondary | ICD-10-CM | POA: Diagnosis not present

## 2020-12-25 DIAGNOSIS — N2581 Secondary hyperparathyroidism of renal origin: Secondary | ICD-10-CM | POA: Diagnosis not present

## 2020-12-25 DIAGNOSIS — E785 Hyperlipidemia, unspecified: Secondary | ICD-10-CM | POA: Diagnosis not present

## 2020-12-25 DIAGNOSIS — N186 End stage renal disease: Secondary | ICD-10-CM | POA: Diagnosis not present

## 2020-12-25 DIAGNOSIS — D509 Iron deficiency anemia, unspecified: Secondary | ICD-10-CM | POA: Diagnosis not present

## 2020-12-25 DIAGNOSIS — D631 Anemia in chronic kidney disease: Secondary | ICD-10-CM | POA: Diagnosis not present

## 2020-12-28 DIAGNOSIS — N186 End stage renal disease: Secondary | ICD-10-CM | POA: Diagnosis not present

## 2020-12-28 DIAGNOSIS — E1121 Type 2 diabetes mellitus with diabetic nephropathy: Secondary | ICD-10-CM | POA: Diagnosis not present

## 2020-12-28 DIAGNOSIS — N2581 Secondary hyperparathyroidism of renal origin: Secondary | ICD-10-CM | POA: Diagnosis not present

## 2020-12-28 DIAGNOSIS — D631 Anemia in chronic kidney disease: Secondary | ICD-10-CM | POA: Diagnosis not present

## 2020-12-28 DIAGNOSIS — E785 Hyperlipidemia, unspecified: Secondary | ICD-10-CM | POA: Diagnosis not present

## 2020-12-28 DIAGNOSIS — D509 Iron deficiency anemia, unspecified: Secondary | ICD-10-CM | POA: Diagnosis not present

## 2020-12-30 DIAGNOSIS — D509 Iron deficiency anemia, unspecified: Secondary | ICD-10-CM | POA: Diagnosis not present

## 2020-12-30 DIAGNOSIS — E1121 Type 2 diabetes mellitus with diabetic nephropathy: Secondary | ICD-10-CM | POA: Diagnosis not present

## 2020-12-30 DIAGNOSIS — D631 Anemia in chronic kidney disease: Secondary | ICD-10-CM | POA: Diagnosis not present

## 2020-12-30 DIAGNOSIS — N186 End stage renal disease: Secondary | ICD-10-CM | POA: Diagnosis not present

## 2020-12-30 DIAGNOSIS — E785 Hyperlipidemia, unspecified: Secondary | ICD-10-CM | POA: Diagnosis not present

## 2020-12-30 DIAGNOSIS — N2581 Secondary hyperparathyroidism of renal origin: Secondary | ICD-10-CM | POA: Diagnosis not present

## 2021-01-01 DIAGNOSIS — N186 End stage renal disease: Secondary | ICD-10-CM | POA: Diagnosis not present

## 2021-01-01 DIAGNOSIS — N2581 Secondary hyperparathyroidism of renal origin: Secondary | ICD-10-CM | POA: Diagnosis not present

## 2021-01-01 DIAGNOSIS — D631 Anemia in chronic kidney disease: Secondary | ICD-10-CM | POA: Diagnosis not present

## 2021-01-01 DIAGNOSIS — E785 Hyperlipidemia, unspecified: Secondary | ICD-10-CM | POA: Diagnosis not present

## 2021-01-01 DIAGNOSIS — E1121 Type 2 diabetes mellitus with diabetic nephropathy: Secondary | ICD-10-CM | POA: Diagnosis not present

## 2021-01-01 DIAGNOSIS — D509 Iron deficiency anemia, unspecified: Secondary | ICD-10-CM | POA: Diagnosis not present

## 2021-01-04 DIAGNOSIS — E785 Hyperlipidemia, unspecified: Secondary | ICD-10-CM | POA: Diagnosis not present

## 2021-01-04 DIAGNOSIS — D631 Anemia in chronic kidney disease: Secondary | ICD-10-CM | POA: Diagnosis not present

## 2021-01-04 DIAGNOSIS — N2581 Secondary hyperparathyroidism of renal origin: Secondary | ICD-10-CM | POA: Diagnosis not present

## 2021-01-04 DIAGNOSIS — E1121 Type 2 diabetes mellitus with diabetic nephropathy: Secondary | ICD-10-CM | POA: Diagnosis not present

## 2021-01-04 DIAGNOSIS — D509 Iron deficiency anemia, unspecified: Secondary | ICD-10-CM | POA: Diagnosis not present

## 2021-01-04 DIAGNOSIS — N186 End stage renal disease: Secondary | ICD-10-CM | POA: Diagnosis not present

## 2021-01-06 DIAGNOSIS — E1121 Type 2 diabetes mellitus with diabetic nephropathy: Secondary | ICD-10-CM | POA: Diagnosis not present

## 2021-01-06 DIAGNOSIS — D631 Anemia in chronic kidney disease: Secondary | ICD-10-CM | POA: Diagnosis not present

## 2021-01-06 DIAGNOSIS — E785 Hyperlipidemia, unspecified: Secondary | ICD-10-CM | POA: Diagnosis not present

## 2021-01-06 DIAGNOSIS — N2581 Secondary hyperparathyroidism of renal origin: Secondary | ICD-10-CM | POA: Diagnosis not present

## 2021-01-06 DIAGNOSIS — N186 End stage renal disease: Secondary | ICD-10-CM | POA: Diagnosis not present

## 2021-01-06 DIAGNOSIS — D509 Iron deficiency anemia, unspecified: Secondary | ICD-10-CM | POA: Diagnosis not present

## 2021-01-08 DIAGNOSIS — N2581 Secondary hyperparathyroidism of renal origin: Secondary | ICD-10-CM | POA: Diagnosis not present

## 2021-01-08 DIAGNOSIS — E785 Hyperlipidemia, unspecified: Secondary | ICD-10-CM | POA: Diagnosis not present

## 2021-01-08 DIAGNOSIS — E1121 Type 2 diabetes mellitus with diabetic nephropathy: Secondary | ICD-10-CM | POA: Diagnosis not present

## 2021-01-08 DIAGNOSIS — D631 Anemia in chronic kidney disease: Secondary | ICD-10-CM | POA: Diagnosis not present

## 2021-01-08 DIAGNOSIS — D509 Iron deficiency anemia, unspecified: Secondary | ICD-10-CM | POA: Diagnosis not present

## 2021-01-08 DIAGNOSIS — N186 End stage renal disease: Secondary | ICD-10-CM | POA: Diagnosis not present

## 2021-01-11 DIAGNOSIS — E1121 Type 2 diabetes mellitus with diabetic nephropathy: Secondary | ICD-10-CM | POA: Diagnosis not present

## 2021-01-11 DIAGNOSIS — D631 Anemia in chronic kidney disease: Secondary | ICD-10-CM | POA: Diagnosis not present

## 2021-01-11 DIAGNOSIS — N186 End stage renal disease: Secondary | ICD-10-CM | POA: Diagnosis not present

## 2021-01-11 DIAGNOSIS — E785 Hyperlipidemia, unspecified: Secondary | ICD-10-CM | POA: Diagnosis not present

## 2021-01-11 DIAGNOSIS — N2581 Secondary hyperparathyroidism of renal origin: Secondary | ICD-10-CM | POA: Diagnosis not present

## 2021-01-11 DIAGNOSIS — D509 Iron deficiency anemia, unspecified: Secondary | ICD-10-CM | POA: Diagnosis not present

## 2021-01-12 DIAGNOSIS — N390 Urinary tract infection, site not specified: Secondary | ICD-10-CM | POA: Diagnosis not present

## 2021-01-12 DIAGNOSIS — N281 Cyst of kidney, acquired: Secondary | ICD-10-CM | POA: Diagnosis not present

## 2021-01-13 DIAGNOSIS — E785 Hyperlipidemia, unspecified: Secondary | ICD-10-CM | POA: Diagnosis not present

## 2021-01-13 DIAGNOSIS — N186 End stage renal disease: Secondary | ICD-10-CM | POA: Diagnosis not present

## 2021-01-13 DIAGNOSIS — E1121 Type 2 diabetes mellitus with diabetic nephropathy: Secondary | ICD-10-CM | POA: Diagnosis not present

## 2021-01-13 DIAGNOSIS — D631 Anemia in chronic kidney disease: Secondary | ICD-10-CM | POA: Diagnosis not present

## 2021-01-13 DIAGNOSIS — N2581 Secondary hyperparathyroidism of renal origin: Secondary | ICD-10-CM | POA: Diagnosis not present

## 2021-01-13 DIAGNOSIS — D509 Iron deficiency anemia, unspecified: Secondary | ICD-10-CM | POA: Diagnosis not present

## 2021-01-15 DIAGNOSIS — N2581 Secondary hyperparathyroidism of renal origin: Secondary | ICD-10-CM | POA: Diagnosis not present

## 2021-01-15 DIAGNOSIS — D509 Iron deficiency anemia, unspecified: Secondary | ICD-10-CM | POA: Diagnosis not present

## 2021-01-15 DIAGNOSIS — D631 Anemia in chronic kidney disease: Secondary | ICD-10-CM | POA: Diagnosis not present

## 2021-01-15 DIAGNOSIS — N186 End stage renal disease: Secondary | ICD-10-CM | POA: Diagnosis not present

## 2021-01-15 DIAGNOSIS — E1121 Type 2 diabetes mellitus with diabetic nephropathy: Secondary | ICD-10-CM | POA: Diagnosis not present

## 2021-01-15 DIAGNOSIS — E785 Hyperlipidemia, unspecified: Secondary | ICD-10-CM | POA: Diagnosis not present

## 2021-01-16 DIAGNOSIS — Z992 Dependence on renal dialysis: Secondary | ICD-10-CM | POA: Diagnosis not present

## 2021-01-16 DIAGNOSIS — N186 End stage renal disease: Secondary | ICD-10-CM | POA: Diagnosis not present

## 2021-01-18 DIAGNOSIS — D509 Iron deficiency anemia, unspecified: Secondary | ICD-10-CM | POA: Diagnosis not present

## 2021-01-18 DIAGNOSIS — N186 End stage renal disease: Secondary | ICD-10-CM | POA: Diagnosis not present

## 2021-01-18 DIAGNOSIS — N2581 Secondary hyperparathyroidism of renal origin: Secondary | ICD-10-CM | POA: Diagnosis not present

## 2021-01-18 DIAGNOSIS — D631 Anemia in chronic kidney disease: Secondary | ICD-10-CM | POA: Diagnosis not present

## 2021-01-18 DIAGNOSIS — E1121 Type 2 diabetes mellitus with diabetic nephropathy: Secondary | ICD-10-CM | POA: Diagnosis not present

## 2021-01-20 DIAGNOSIS — D638 Anemia in other chronic diseases classified elsewhere: Secondary | ICD-10-CM | POA: Diagnosis not present

## 2021-01-20 DIAGNOSIS — N184 Chronic kidney disease, stage 4 (severe): Secondary | ICD-10-CM | POA: Diagnosis not present

## 2021-01-20 DIAGNOSIS — D509 Iron deficiency anemia, unspecified: Secondary | ICD-10-CM | POA: Diagnosis not present

## 2021-01-20 DIAGNOSIS — I119 Hypertensive heart disease without heart failure: Secondary | ICD-10-CM | POA: Diagnosis not present

## 2021-01-20 DIAGNOSIS — N186 End stage renal disease: Secondary | ICD-10-CM | POA: Diagnosis not present

## 2021-01-20 DIAGNOSIS — D631 Anemia in chronic kidney disease: Secondary | ICD-10-CM | POA: Diagnosis not present

## 2021-01-20 DIAGNOSIS — F419 Anxiety disorder, unspecified: Secondary | ICD-10-CM | POA: Diagnosis not present

## 2021-01-20 DIAGNOSIS — E559 Vitamin D deficiency, unspecified: Secondary | ICD-10-CM | POA: Diagnosis not present

## 2021-01-20 DIAGNOSIS — Z794 Long term (current) use of insulin: Secondary | ICD-10-CM | POA: Diagnosis not present

## 2021-01-20 DIAGNOSIS — E119 Type 2 diabetes mellitus without complications: Secondary | ICD-10-CM | POA: Diagnosis not present

## 2021-01-20 DIAGNOSIS — E1165 Type 2 diabetes mellitus with hyperglycemia: Secondary | ICD-10-CM | POA: Diagnosis not present

## 2021-01-20 DIAGNOSIS — Z89611 Acquired absence of right leg above knee: Secondary | ICD-10-CM | POA: Diagnosis not present

## 2021-01-20 DIAGNOSIS — E1121 Type 2 diabetes mellitus with diabetic nephropathy: Secondary | ICD-10-CM | POA: Diagnosis not present

## 2021-01-20 DIAGNOSIS — N2581 Secondary hyperparathyroidism of renal origin: Secondary | ICD-10-CM | POA: Diagnosis not present

## 2021-01-20 DIAGNOSIS — E538 Deficiency of other specified B group vitamins: Secondary | ICD-10-CM | POA: Diagnosis not present

## 2021-01-20 DIAGNOSIS — I10 Essential (primary) hypertension: Secondary | ICD-10-CM | POA: Diagnosis not present

## 2021-01-22 DIAGNOSIS — E1121 Type 2 diabetes mellitus with diabetic nephropathy: Secondary | ICD-10-CM | POA: Diagnosis not present

## 2021-01-22 DIAGNOSIS — N186 End stage renal disease: Secondary | ICD-10-CM | POA: Diagnosis not present

## 2021-01-22 DIAGNOSIS — D509 Iron deficiency anemia, unspecified: Secondary | ICD-10-CM | POA: Diagnosis not present

## 2021-01-22 DIAGNOSIS — N2581 Secondary hyperparathyroidism of renal origin: Secondary | ICD-10-CM | POA: Diagnosis not present

## 2021-01-22 DIAGNOSIS — D631 Anemia in chronic kidney disease: Secondary | ICD-10-CM | POA: Diagnosis not present

## 2021-01-25 DIAGNOSIS — D631 Anemia in chronic kidney disease: Secondary | ICD-10-CM | POA: Diagnosis not present

## 2021-01-25 DIAGNOSIS — D509 Iron deficiency anemia, unspecified: Secondary | ICD-10-CM | POA: Diagnosis not present

## 2021-01-25 DIAGNOSIS — N186 End stage renal disease: Secondary | ICD-10-CM | POA: Diagnosis not present

## 2021-01-25 DIAGNOSIS — E1121 Type 2 diabetes mellitus with diabetic nephropathy: Secondary | ICD-10-CM | POA: Diagnosis not present

## 2021-01-25 DIAGNOSIS — N2581 Secondary hyperparathyroidism of renal origin: Secondary | ICD-10-CM | POA: Diagnosis not present

## 2021-01-27 DIAGNOSIS — E1121 Type 2 diabetes mellitus with diabetic nephropathy: Secondary | ICD-10-CM | POA: Diagnosis not present

## 2021-01-27 DIAGNOSIS — D631 Anemia in chronic kidney disease: Secondary | ICD-10-CM | POA: Diagnosis not present

## 2021-01-27 DIAGNOSIS — D509 Iron deficiency anemia, unspecified: Secondary | ICD-10-CM | POA: Diagnosis not present

## 2021-01-27 DIAGNOSIS — N2581 Secondary hyperparathyroidism of renal origin: Secondary | ICD-10-CM | POA: Diagnosis not present

## 2021-01-27 DIAGNOSIS — N186 End stage renal disease: Secondary | ICD-10-CM | POA: Diagnosis not present

## 2021-01-29 DIAGNOSIS — E1121 Type 2 diabetes mellitus with diabetic nephropathy: Secondary | ICD-10-CM | POA: Diagnosis not present

## 2021-01-29 DIAGNOSIS — D631 Anemia in chronic kidney disease: Secondary | ICD-10-CM | POA: Diagnosis not present

## 2021-01-29 DIAGNOSIS — D509 Iron deficiency anemia, unspecified: Secondary | ICD-10-CM | POA: Diagnosis not present

## 2021-01-29 DIAGNOSIS — N2581 Secondary hyperparathyroidism of renal origin: Secondary | ICD-10-CM | POA: Diagnosis not present

## 2021-01-29 DIAGNOSIS — N186 End stage renal disease: Secondary | ICD-10-CM | POA: Diagnosis not present

## 2021-02-01 DIAGNOSIS — N2581 Secondary hyperparathyroidism of renal origin: Secondary | ICD-10-CM | POA: Diagnosis not present

## 2021-02-01 DIAGNOSIS — E1121 Type 2 diabetes mellitus with diabetic nephropathy: Secondary | ICD-10-CM | POA: Diagnosis not present

## 2021-02-01 DIAGNOSIS — N186 End stage renal disease: Secondary | ICD-10-CM | POA: Diagnosis not present

## 2021-02-01 DIAGNOSIS — D509 Iron deficiency anemia, unspecified: Secondary | ICD-10-CM | POA: Diagnosis not present

## 2021-02-01 DIAGNOSIS — D631 Anemia in chronic kidney disease: Secondary | ICD-10-CM | POA: Diagnosis not present

## 2021-02-03 DIAGNOSIS — D509 Iron deficiency anemia, unspecified: Secondary | ICD-10-CM | POA: Diagnosis not present

## 2021-02-03 DIAGNOSIS — D631 Anemia in chronic kidney disease: Secondary | ICD-10-CM | POA: Diagnosis not present

## 2021-02-03 DIAGNOSIS — N186 End stage renal disease: Secondary | ICD-10-CM | POA: Diagnosis not present

## 2021-02-03 DIAGNOSIS — N2581 Secondary hyperparathyroidism of renal origin: Secondary | ICD-10-CM | POA: Diagnosis not present

## 2021-02-03 DIAGNOSIS — E1121 Type 2 diabetes mellitus with diabetic nephropathy: Secondary | ICD-10-CM | POA: Diagnosis not present

## 2021-02-05 DIAGNOSIS — N2581 Secondary hyperparathyroidism of renal origin: Secondary | ICD-10-CM | POA: Diagnosis not present

## 2021-02-05 DIAGNOSIS — D509 Iron deficiency anemia, unspecified: Secondary | ICD-10-CM | POA: Diagnosis not present

## 2021-02-05 DIAGNOSIS — D631 Anemia in chronic kidney disease: Secondary | ICD-10-CM | POA: Diagnosis not present

## 2021-02-05 DIAGNOSIS — N186 End stage renal disease: Secondary | ICD-10-CM | POA: Diagnosis not present

## 2021-02-05 DIAGNOSIS — E1121 Type 2 diabetes mellitus with diabetic nephropathy: Secondary | ICD-10-CM | POA: Diagnosis not present

## 2021-02-08 DIAGNOSIS — D509 Iron deficiency anemia, unspecified: Secondary | ICD-10-CM | POA: Diagnosis not present

## 2021-02-08 DIAGNOSIS — D631 Anemia in chronic kidney disease: Secondary | ICD-10-CM | POA: Diagnosis not present

## 2021-02-08 DIAGNOSIS — N186 End stage renal disease: Secondary | ICD-10-CM | POA: Diagnosis not present

## 2021-02-08 DIAGNOSIS — E1121 Type 2 diabetes mellitus with diabetic nephropathy: Secondary | ICD-10-CM | POA: Diagnosis not present

## 2021-02-08 DIAGNOSIS — N2581 Secondary hyperparathyroidism of renal origin: Secondary | ICD-10-CM | POA: Diagnosis not present

## 2021-02-10 DIAGNOSIS — N2581 Secondary hyperparathyroidism of renal origin: Secondary | ICD-10-CM | POA: Diagnosis not present

## 2021-02-10 DIAGNOSIS — D509 Iron deficiency anemia, unspecified: Secondary | ICD-10-CM | POA: Diagnosis not present

## 2021-02-10 DIAGNOSIS — E1121 Type 2 diabetes mellitus with diabetic nephropathy: Secondary | ICD-10-CM | POA: Diagnosis not present

## 2021-02-10 DIAGNOSIS — D631 Anemia in chronic kidney disease: Secondary | ICD-10-CM | POA: Diagnosis not present

## 2021-02-10 DIAGNOSIS — N186 End stage renal disease: Secondary | ICD-10-CM | POA: Diagnosis not present

## 2021-02-11 DIAGNOSIS — B9689 Other specified bacterial agents as the cause of diseases classified elsewhere: Secondary | ICD-10-CM | POA: Diagnosis not present

## 2021-02-11 DIAGNOSIS — R8290 Unspecified abnormal findings in urine: Secondary | ICD-10-CM | POA: Diagnosis not present

## 2021-02-11 DIAGNOSIS — N390 Urinary tract infection, site not specified: Secondary | ICD-10-CM | POA: Diagnosis not present

## 2021-02-12 DIAGNOSIS — D509 Iron deficiency anemia, unspecified: Secondary | ICD-10-CM | POA: Diagnosis not present

## 2021-02-12 DIAGNOSIS — N186 End stage renal disease: Secondary | ICD-10-CM | POA: Diagnosis not present

## 2021-02-12 DIAGNOSIS — D631 Anemia in chronic kidney disease: Secondary | ICD-10-CM | POA: Diagnosis not present

## 2021-02-12 DIAGNOSIS — E1121 Type 2 diabetes mellitus with diabetic nephropathy: Secondary | ICD-10-CM | POA: Diagnosis not present

## 2021-02-12 DIAGNOSIS — N2581 Secondary hyperparathyroidism of renal origin: Secondary | ICD-10-CM | POA: Diagnosis not present

## 2021-02-15 DIAGNOSIS — D509 Iron deficiency anemia, unspecified: Secondary | ICD-10-CM | POA: Diagnosis not present

## 2021-02-15 DIAGNOSIS — N2581 Secondary hyperparathyroidism of renal origin: Secondary | ICD-10-CM | POA: Diagnosis not present

## 2021-02-15 DIAGNOSIS — D631 Anemia in chronic kidney disease: Secondary | ICD-10-CM | POA: Diagnosis not present

## 2021-02-15 DIAGNOSIS — N186 End stage renal disease: Secondary | ICD-10-CM | POA: Diagnosis not present

## 2021-02-15 DIAGNOSIS — E1121 Type 2 diabetes mellitus with diabetic nephropathy: Secondary | ICD-10-CM | POA: Diagnosis not present

## 2021-02-16 DIAGNOSIS — Z992 Dependence on renal dialysis: Secondary | ICD-10-CM | POA: Diagnosis not present

## 2021-02-16 DIAGNOSIS — N186 End stage renal disease: Secondary | ICD-10-CM | POA: Diagnosis not present

## 2021-02-17 DIAGNOSIS — E1121 Type 2 diabetes mellitus with diabetic nephropathy: Secondary | ICD-10-CM | POA: Diagnosis not present

## 2021-02-17 DIAGNOSIS — D509 Iron deficiency anemia, unspecified: Secondary | ICD-10-CM | POA: Diagnosis not present

## 2021-02-17 DIAGNOSIS — N186 End stage renal disease: Secondary | ICD-10-CM | POA: Diagnosis not present

## 2021-02-17 DIAGNOSIS — N2581 Secondary hyperparathyroidism of renal origin: Secondary | ICD-10-CM | POA: Diagnosis not present

## 2021-02-17 DIAGNOSIS — D631 Anemia in chronic kidney disease: Secondary | ICD-10-CM | POA: Diagnosis not present

## 2021-02-19 DIAGNOSIS — D509 Iron deficiency anemia, unspecified: Secondary | ICD-10-CM | POA: Diagnosis not present

## 2021-02-19 DIAGNOSIS — N2581 Secondary hyperparathyroidism of renal origin: Secondary | ICD-10-CM | POA: Diagnosis not present

## 2021-02-19 DIAGNOSIS — D631 Anemia in chronic kidney disease: Secondary | ICD-10-CM | POA: Diagnosis not present

## 2021-02-19 DIAGNOSIS — E1121 Type 2 diabetes mellitus with diabetic nephropathy: Secondary | ICD-10-CM | POA: Diagnosis not present

## 2021-02-19 DIAGNOSIS — N186 End stage renal disease: Secondary | ICD-10-CM | POA: Diagnosis not present

## 2021-02-22 DIAGNOSIS — N2581 Secondary hyperparathyroidism of renal origin: Secondary | ICD-10-CM | POA: Diagnosis not present

## 2021-02-22 DIAGNOSIS — D631 Anemia in chronic kidney disease: Secondary | ICD-10-CM | POA: Diagnosis not present

## 2021-02-22 DIAGNOSIS — E1121 Type 2 diabetes mellitus with diabetic nephropathy: Secondary | ICD-10-CM | POA: Diagnosis not present

## 2021-02-22 DIAGNOSIS — N186 End stage renal disease: Secondary | ICD-10-CM | POA: Diagnosis not present

## 2021-02-22 DIAGNOSIS — D509 Iron deficiency anemia, unspecified: Secondary | ICD-10-CM | POA: Diagnosis not present

## 2021-02-24 DIAGNOSIS — N186 End stage renal disease: Secondary | ICD-10-CM | POA: Diagnosis not present

## 2021-02-24 DIAGNOSIS — E559 Vitamin D deficiency, unspecified: Secondary | ICD-10-CM | POA: Diagnosis not present

## 2021-02-24 DIAGNOSIS — I10 Essential (primary) hypertension: Secondary | ICD-10-CM | POA: Diagnosis not present

## 2021-02-24 DIAGNOSIS — F419 Anxiety disorder, unspecified: Secondary | ICD-10-CM | POA: Diagnosis not present

## 2021-02-24 DIAGNOSIS — D631 Anemia in chronic kidney disease: Secondary | ICD-10-CM | POA: Diagnosis not present

## 2021-02-24 DIAGNOSIS — D509 Iron deficiency anemia, unspecified: Secondary | ICD-10-CM | POA: Diagnosis not present

## 2021-02-24 DIAGNOSIS — E1165 Type 2 diabetes mellitus with hyperglycemia: Secondary | ICD-10-CM | POA: Diagnosis not present

## 2021-02-24 DIAGNOSIS — E538 Deficiency of other specified B group vitamins: Secondary | ICD-10-CM | POA: Diagnosis not present

## 2021-02-24 DIAGNOSIS — I119 Hypertensive heart disease without heart failure: Secondary | ICD-10-CM | POA: Diagnosis not present

## 2021-02-24 DIAGNOSIS — Z794 Long term (current) use of insulin: Secondary | ICD-10-CM | POA: Diagnosis not present

## 2021-02-24 DIAGNOSIS — E1121 Type 2 diabetes mellitus with diabetic nephropathy: Secondary | ICD-10-CM | POA: Diagnosis not present

## 2021-02-24 DIAGNOSIS — N2581 Secondary hyperparathyroidism of renal origin: Secondary | ICD-10-CM | POA: Diagnosis not present

## 2021-02-24 DIAGNOSIS — Z89611 Acquired absence of right leg above knee: Secondary | ICD-10-CM | POA: Diagnosis not present

## 2021-02-24 DIAGNOSIS — D638 Anemia in other chronic diseases classified elsewhere: Secondary | ICD-10-CM | POA: Diagnosis not present

## 2021-02-26 DIAGNOSIS — N2581 Secondary hyperparathyroidism of renal origin: Secondary | ICD-10-CM | POA: Diagnosis not present

## 2021-02-26 DIAGNOSIS — D509 Iron deficiency anemia, unspecified: Secondary | ICD-10-CM | POA: Diagnosis not present

## 2021-02-26 DIAGNOSIS — N186 End stage renal disease: Secondary | ICD-10-CM | POA: Diagnosis not present

## 2021-02-26 DIAGNOSIS — D631 Anemia in chronic kidney disease: Secondary | ICD-10-CM | POA: Diagnosis not present

## 2021-02-26 DIAGNOSIS — E1121 Type 2 diabetes mellitus with diabetic nephropathy: Secondary | ICD-10-CM | POA: Diagnosis not present

## 2021-03-01 DIAGNOSIS — D631 Anemia in chronic kidney disease: Secondary | ICD-10-CM | POA: Diagnosis not present

## 2021-03-01 DIAGNOSIS — D509 Iron deficiency anemia, unspecified: Secondary | ICD-10-CM | POA: Diagnosis not present

## 2021-03-01 DIAGNOSIS — N186 End stage renal disease: Secondary | ICD-10-CM | POA: Diagnosis not present

## 2021-03-01 DIAGNOSIS — N2581 Secondary hyperparathyroidism of renal origin: Secondary | ICD-10-CM | POA: Diagnosis not present

## 2021-03-01 DIAGNOSIS — E1121 Type 2 diabetes mellitus with diabetic nephropathy: Secondary | ICD-10-CM | POA: Diagnosis not present

## 2021-03-03 DIAGNOSIS — N186 End stage renal disease: Secondary | ICD-10-CM | POA: Diagnosis not present

## 2021-03-03 DIAGNOSIS — N2581 Secondary hyperparathyroidism of renal origin: Secondary | ICD-10-CM | POA: Diagnosis not present

## 2021-03-03 DIAGNOSIS — E1121 Type 2 diabetes mellitus with diabetic nephropathy: Secondary | ICD-10-CM | POA: Diagnosis not present

## 2021-03-03 DIAGNOSIS — D509 Iron deficiency anemia, unspecified: Secondary | ICD-10-CM | POA: Diagnosis not present

## 2021-03-03 DIAGNOSIS — D631 Anemia in chronic kidney disease: Secondary | ICD-10-CM | POA: Diagnosis not present

## 2021-03-05 DIAGNOSIS — N186 End stage renal disease: Secondary | ICD-10-CM | POA: Diagnosis not present

## 2021-03-05 DIAGNOSIS — N2581 Secondary hyperparathyroidism of renal origin: Secondary | ICD-10-CM | POA: Diagnosis not present

## 2021-03-05 DIAGNOSIS — D509 Iron deficiency anemia, unspecified: Secondary | ICD-10-CM | POA: Diagnosis not present

## 2021-03-05 DIAGNOSIS — D631 Anemia in chronic kidney disease: Secondary | ICD-10-CM | POA: Diagnosis not present

## 2021-03-05 DIAGNOSIS — E1121 Type 2 diabetes mellitus with diabetic nephropathy: Secondary | ICD-10-CM | POA: Diagnosis not present

## 2021-03-08 DIAGNOSIS — D509 Iron deficiency anemia, unspecified: Secondary | ICD-10-CM | POA: Diagnosis not present

## 2021-03-08 DIAGNOSIS — D631 Anemia in chronic kidney disease: Secondary | ICD-10-CM | POA: Diagnosis not present

## 2021-03-08 DIAGNOSIS — E1121 Type 2 diabetes mellitus with diabetic nephropathy: Secondary | ICD-10-CM | POA: Diagnosis not present

## 2021-03-08 DIAGNOSIS — N186 End stage renal disease: Secondary | ICD-10-CM | POA: Diagnosis not present

## 2021-03-08 DIAGNOSIS — N2581 Secondary hyperparathyroidism of renal origin: Secondary | ICD-10-CM | POA: Diagnosis not present

## 2021-03-09 DIAGNOSIS — Z992 Dependence on renal dialysis: Secondary | ICD-10-CM | POA: Diagnosis not present

## 2021-03-09 DIAGNOSIS — N323 Diverticulum of bladder: Secondary | ICD-10-CM | POA: Diagnosis not present

## 2021-03-09 DIAGNOSIS — N184 Chronic kidney disease, stage 4 (severe): Secondary | ICD-10-CM | POA: Diagnosis not present

## 2021-03-09 DIAGNOSIS — N390 Urinary tract infection, site not specified: Secondary | ICD-10-CM | POA: Diagnosis not present

## 2021-03-10 DIAGNOSIS — D631 Anemia in chronic kidney disease: Secondary | ICD-10-CM | POA: Diagnosis not present

## 2021-03-10 DIAGNOSIS — N186 End stage renal disease: Secondary | ICD-10-CM | POA: Diagnosis not present

## 2021-03-10 DIAGNOSIS — D509 Iron deficiency anemia, unspecified: Secondary | ICD-10-CM | POA: Diagnosis not present

## 2021-03-10 DIAGNOSIS — N2581 Secondary hyperparathyroidism of renal origin: Secondary | ICD-10-CM | POA: Diagnosis not present

## 2021-03-10 DIAGNOSIS — E1121 Type 2 diabetes mellitus with diabetic nephropathy: Secondary | ICD-10-CM | POA: Diagnosis not present

## 2021-03-12 DIAGNOSIS — E1121 Type 2 diabetes mellitus with diabetic nephropathy: Secondary | ICD-10-CM | POA: Diagnosis not present

## 2021-03-12 DIAGNOSIS — N2581 Secondary hyperparathyroidism of renal origin: Secondary | ICD-10-CM | POA: Diagnosis not present

## 2021-03-12 DIAGNOSIS — D509 Iron deficiency anemia, unspecified: Secondary | ICD-10-CM | POA: Diagnosis not present

## 2021-03-12 DIAGNOSIS — N186 End stage renal disease: Secondary | ICD-10-CM | POA: Diagnosis not present

## 2021-03-12 DIAGNOSIS — D631 Anemia in chronic kidney disease: Secondary | ICD-10-CM | POA: Diagnosis not present

## 2021-03-15 DIAGNOSIS — D631 Anemia in chronic kidney disease: Secondary | ICD-10-CM | POA: Diagnosis not present

## 2021-03-15 DIAGNOSIS — E1121 Type 2 diabetes mellitus with diabetic nephropathy: Secondary | ICD-10-CM | POA: Diagnosis not present

## 2021-03-15 DIAGNOSIS — N186 End stage renal disease: Secondary | ICD-10-CM | POA: Diagnosis not present

## 2021-03-15 DIAGNOSIS — D509 Iron deficiency anemia, unspecified: Secondary | ICD-10-CM | POA: Diagnosis not present

## 2021-03-15 DIAGNOSIS — N2581 Secondary hyperparathyroidism of renal origin: Secondary | ICD-10-CM | POA: Diagnosis not present

## 2021-03-17 DIAGNOSIS — D509 Iron deficiency anemia, unspecified: Secondary | ICD-10-CM | POA: Diagnosis not present

## 2021-03-17 DIAGNOSIS — N186 End stage renal disease: Secondary | ICD-10-CM | POA: Diagnosis not present

## 2021-03-17 DIAGNOSIS — D631 Anemia in chronic kidney disease: Secondary | ICD-10-CM | POA: Diagnosis not present

## 2021-03-17 DIAGNOSIS — E1121 Type 2 diabetes mellitus with diabetic nephropathy: Secondary | ICD-10-CM | POA: Diagnosis not present

## 2021-03-17 DIAGNOSIS — N2581 Secondary hyperparathyroidism of renal origin: Secondary | ICD-10-CM | POA: Diagnosis not present

## 2021-03-18 DIAGNOSIS — Z992 Dependence on renal dialysis: Secondary | ICD-10-CM | POA: Diagnosis not present

## 2021-03-18 DIAGNOSIS — N186 End stage renal disease: Secondary | ICD-10-CM | POA: Diagnosis not present

## 2021-03-19 DIAGNOSIS — N186 End stage renal disease: Secondary | ICD-10-CM | POA: Diagnosis not present

## 2021-03-19 DIAGNOSIS — D631 Anemia in chronic kidney disease: Secondary | ICD-10-CM | POA: Diagnosis not present

## 2021-03-19 DIAGNOSIS — D509 Iron deficiency anemia, unspecified: Secondary | ICD-10-CM | POA: Diagnosis not present

## 2021-03-19 DIAGNOSIS — N2581 Secondary hyperparathyroidism of renal origin: Secondary | ICD-10-CM | POA: Diagnosis not present

## 2021-03-19 DIAGNOSIS — E1121 Type 2 diabetes mellitus with diabetic nephropathy: Secondary | ICD-10-CM | POA: Diagnosis not present

## 2021-03-19 DIAGNOSIS — E785 Hyperlipidemia, unspecified: Secondary | ICD-10-CM | POA: Diagnosis not present

## 2021-03-22 DIAGNOSIS — N186 End stage renal disease: Secondary | ICD-10-CM | POA: Diagnosis not present

## 2021-03-22 DIAGNOSIS — D631 Anemia in chronic kidney disease: Secondary | ICD-10-CM | POA: Diagnosis not present

## 2021-03-22 DIAGNOSIS — E785 Hyperlipidemia, unspecified: Secondary | ICD-10-CM | POA: Diagnosis not present

## 2021-03-22 DIAGNOSIS — E1121 Type 2 diabetes mellitus with diabetic nephropathy: Secondary | ICD-10-CM | POA: Diagnosis not present

## 2021-03-22 DIAGNOSIS — D509 Iron deficiency anemia, unspecified: Secondary | ICD-10-CM | POA: Diagnosis not present

## 2021-03-22 DIAGNOSIS — N2581 Secondary hyperparathyroidism of renal origin: Secondary | ICD-10-CM | POA: Diagnosis not present

## 2021-03-24 DIAGNOSIS — N186 End stage renal disease: Secondary | ICD-10-CM | POA: Diagnosis not present

## 2021-03-24 DIAGNOSIS — E119 Type 2 diabetes mellitus without complications: Secondary | ICD-10-CM | POA: Diagnosis not present

## 2021-03-24 DIAGNOSIS — D631 Anemia in chronic kidney disease: Secondary | ICD-10-CM | POA: Diagnosis not present

## 2021-03-24 DIAGNOSIS — D509 Iron deficiency anemia, unspecified: Secondary | ICD-10-CM | POA: Diagnosis not present

## 2021-03-24 DIAGNOSIS — N2581 Secondary hyperparathyroidism of renal origin: Secondary | ICD-10-CM | POA: Diagnosis not present

## 2021-03-24 DIAGNOSIS — E785 Hyperlipidemia, unspecified: Secondary | ICD-10-CM | POA: Diagnosis not present

## 2021-03-24 DIAGNOSIS — E1121 Type 2 diabetes mellitus with diabetic nephropathy: Secondary | ICD-10-CM | POA: Diagnosis not present

## 2021-03-26 DIAGNOSIS — E1121 Type 2 diabetes mellitus with diabetic nephropathy: Secondary | ICD-10-CM | POA: Diagnosis not present

## 2021-03-26 DIAGNOSIS — N186 End stage renal disease: Secondary | ICD-10-CM | POA: Diagnosis not present

## 2021-03-26 DIAGNOSIS — D631 Anemia in chronic kidney disease: Secondary | ICD-10-CM | POA: Diagnosis not present

## 2021-03-26 DIAGNOSIS — N2581 Secondary hyperparathyroidism of renal origin: Secondary | ICD-10-CM | POA: Diagnosis not present

## 2021-03-26 DIAGNOSIS — E785 Hyperlipidemia, unspecified: Secondary | ICD-10-CM | POA: Diagnosis not present

## 2021-03-26 DIAGNOSIS — D509 Iron deficiency anemia, unspecified: Secondary | ICD-10-CM | POA: Diagnosis not present

## 2021-03-29 DIAGNOSIS — E1121 Type 2 diabetes mellitus with diabetic nephropathy: Secondary | ICD-10-CM | POA: Diagnosis not present

## 2021-03-29 DIAGNOSIS — E785 Hyperlipidemia, unspecified: Secondary | ICD-10-CM | POA: Diagnosis not present

## 2021-03-29 DIAGNOSIS — N186 End stage renal disease: Secondary | ICD-10-CM | POA: Diagnosis not present

## 2021-03-29 DIAGNOSIS — D509 Iron deficiency anemia, unspecified: Secondary | ICD-10-CM | POA: Diagnosis not present

## 2021-03-29 DIAGNOSIS — N2581 Secondary hyperparathyroidism of renal origin: Secondary | ICD-10-CM | POA: Diagnosis not present

## 2021-03-29 DIAGNOSIS — D631 Anemia in chronic kidney disease: Secondary | ICD-10-CM | POA: Diagnosis not present

## 2021-03-31 DIAGNOSIS — N186 End stage renal disease: Secondary | ICD-10-CM | POA: Diagnosis not present

## 2021-03-31 DIAGNOSIS — N2581 Secondary hyperparathyroidism of renal origin: Secondary | ICD-10-CM | POA: Diagnosis not present

## 2021-03-31 DIAGNOSIS — E785 Hyperlipidemia, unspecified: Secondary | ICD-10-CM | POA: Diagnosis not present

## 2021-03-31 DIAGNOSIS — D509 Iron deficiency anemia, unspecified: Secondary | ICD-10-CM | POA: Diagnosis not present

## 2021-03-31 DIAGNOSIS — E1121 Type 2 diabetes mellitus with diabetic nephropathy: Secondary | ICD-10-CM | POA: Diagnosis not present

## 2021-03-31 DIAGNOSIS — D631 Anemia in chronic kidney disease: Secondary | ICD-10-CM | POA: Diagnosis not present

## 2021-04-02 DIAGNOSIS — D509 Iron deficiency anemia, unspecified: Secondary | ICD-10-CM | POA: Diagnosis not present

## 2021-04-02 DIAGNOSIS — E785 Hyperlipidemia, unspecified: Secondary | ICD-10-CM | POA: Diagnosis not present

## 2021-04-02 DIAGNOSIS — E1121 Type 2 diabetes mellitus with diabetic nephropathy: Secondary | ICD-10-CM | POA: Diagnosis not present

## 2021-04-02 DIAGNOSIS — D631 Anemia in chronic kidney disease: Secondary | ICD-10-CM | POA: Diagnosis not present

## 2021-04-02 DIAGNOSIS — N186 End stage renal disease: Secondary | ICD-10-CM | POA: Diagnosis not present

## 2021-04-02 DIAGNOSIS — N2581 Secondary hyperparathyroidism of renal origin: Secondary | ICD-10-CM | POA: Diagnosis not present

## 2021-04-05 DIAGNOSIS — E1121 Type 2 diabetes mellitus with diabetic nephropathy: Secondary | ICD-10-CM | POA: Diagnosis not present

## 2021-04-05 DIAGNOSIS — D509 Iron deficiency anemia, unspecified: Secondary | ICD-10-CM | POA: Diagnosis not present

## 2021-04-05 DIAGNOSIS — N2581 Secondary hyperparathyroidism of renal origin: Secondary | ICD-10-CM | POA: Diagnosis not present

## 2021-04-05 DIAGNOSIS — E785 Hyperlipidemia, unspecified: Secondary | ICD-10-CM | POA: Diagnosis not present

## 2021-04-05 DIAGNOSIS — D631 Anemia in chronic kidney disease: Secondary | ICD-10-CM | POA: Diagnosis not present

## 2021-04-05 DIAGNOSIS — N186 End stage renal disease: Secondary | ICD-10-CM | POA: Diagnosis not present

## 2021-04-07 DIAGNOSIS — D631 Anemia in chronic kidney disease: Secondary | ICD-10-CM | POA: Diagnosis not present

## 2021-04-07 DIAGNOSIS — D509 Iron deficiency anemia, unspecified: Secondary | ICD-10-CM | POA: Diagnosis not present

## 2021-04-07 DIAGNOSIS — E1121 Type 2 diabetes mellitus with diabetic nephropathy: Secondary | ICD-10-CM | POA: Diagnosis not present

## 2021-04-07 DIAGNOSIS — N186 End stage renal disease: Secondary | ICD-10-CM | POA: Diagnosis not present

## 2021-04-07 DIAGNOSIS — E785 Hyperlipidemia, unspecified: Secondary | ICD-10-CM | POA: Diagnosis not present

## 2021-04-07 DIAGNOSIS — N2581 Secondary hyperparathyroidism of renal origin: Secondary | ICD-10-CM | POA: Diagnosis not present

## 2021-04-09 DIAGNOSIS — N186 End stage renal disease: Secondary | ICD-10-CM | POA: Diagnosis not present

## 2021-04-09 DIAGNOSIS — D509 Iron deficiency anemia, unspecified: Secondary | ICD-10-CM | POA: Diagnosis not present

## 2021-04-09 DIAGNOSIS — N2581 Secondary hyperparathyroidism of renal origin: Secondary | ICD-10-CM | POA: Diagnosis not present

## 2021-04-09 DIAGNOSIS — E1121 Type 2 diabetes mellitus with diabetic nephropathy: Secondary | ICD-10-CM | POA: Diagnosis not present

## 2021-04-09 DIAGNOSIS — E785 Hyperlipidemia, unspecified: Secondary | ICD-10-CM | POA: Diagnosis not present

## 2021-04-09 DIAGNOSIS — D631 Anemia in chronic kidney disease: Secondary | ICD-10-CM | POA: Diagnosis not present

## 2021-04-11 ENCOUNTER — Emergency Department (HOSPITAL_BASED_OUTPATIENT_CLINIC_OR_DEPARTMENT_OTHER)
Admission: EM | Admit: 2021-04-11 | Discharge: 2021-04-11 | Disposition: A | Discharge status: Home / Self Care | Payer: Medicare Other | Attending: Emergency Medicine | Admitting: Emergency Medicine

## 2021-04-11 ENCOUNTER — Encounter (HOSPITAL_BASED_OUTPATIENT_CLINIC_OR_DEPARTMENT_OTHER): Payer: Self-pay | Admitting: Emergency Medicine

## 2021-04-11 ENCOUNTER — Other Ambulatory Visit: Payer: Self-pay

## 2021-04-11 DIAGNOSIS — Z7982 Long term (current) use of aspirin: Secondary | ICD-10-CM | POA: Diagnosis not present

## 2021-04-11 DIAGNOSIS — I132 Hypertensive heart and chronic kidney disease with heart failure and with stage 5 chronic kidney disease, or end stage renal disease: Secondary | ICD-10-CM | POA: Diagnosis not present

## 2021-04-11 DIAGNOSIS — Z87891 Personal history of nicotine dependence: Secondary | ICD-10-CM | POA: Insufficient documentation

## 2021-04-11 DIAGNOSIS — Z992 Dependence on renal dialysis: Secondary | ICD-10-CM | POA: Insufficient documentation

## 2021-04-11 DIAGNOSIS — N186 End stage renal disease: Secondary | ICD-10-CM | POA: Insufficient documentation

## 2021-04-11 DIAGNOSIS — I2510 Atherosclerotic heart disease of native coronary artery without angina pectoris: Secondary | ICD-10-CM | POA: Insufficient documentation

## 2021-04-11 DIAGNOSIS — Z794 Long term (current) use of insulin: Secondary | ICD-10-CM | POA: Insufficient documentation

## 2021-04-11 DIAGNOSIS — M79604 Pain in right leg: Secondary | ICD-10-CM | POA: Diagnosis not present

## 2021-04-11 DIAGNOSIS — I5042 Chronic combined systolic (congestive) and diastolic (congestive) heart failure: Secondary | ICD-10-CM | POA: Diagnosis not present

## 2021-04-11 DIAGNOSIS — E1122 Type 2 diabetes mellitus with diabetic chronic kidney disease: Secondary | ICD-10-CM | POA: Diagnosis not present

## 2021-04-11 DIAGNOSIS — Z79899 Other long term (current) drug therapy: Secondary | ICD-10-CM | POA: Insufficient documentation

## 2021-04-11 MED ORDER — OXYCODONE-ACETAMINOPHEN 5-325 MG PO TABS
1.0000 | ORAL_TABLET | Freq: Once | ORAL | Status: AC
  Administered 2021-04-11: 1 via ORAL
  Filled 2021-04-11: qty 1

## 2021-04-11 MED ORDER — OXYCODONE-ACETAMINOPHEN 5-325 MG PO TABS: 1.0000 | ORAL_TABLET | Freq: Four times a day (QID) | ORAL | 0 refills | Status: DC | PRN

## 2021-04-11 NOTE — Discharge Instructions (Addendum)
You were seen in the emergency department for right leg pain.  There was no obvious signs of trauma or infection.  You were given some pain medication.  Please follow-up with your primary care doctor.  Return to the emergency department for any worsening or concerning symptoms.

## 2021-04-11 NOTE — ED Provider Notes (Signed)
[facility] EMERGENCY DEPARTMENT Provider Note   CSN: [id number] Arrival date & time: 04/11/21  0715     History Chief Complaint  Patient presents with   Leg Pain    Jenna Brown is a 70 y.o. female.  She has a history of diabetes peripheral vascular disease right AKA.  Complaining of right leg pain started around 4 AM this morning.  History of recurrent right leg pain.  She says that little white pill helps when she has these flareups.  No trauma.  No fever.  She felt nauseous with some of the pain.  Her brother is here with her and states this flares up now and again.  The history is provided by the patient.  Leg Pain Location:  Leg Time since incident:  3 hours Leg location:  R leg and R upper leg Pain details:    Quality:  Shooting   Severity:  Severe   Onset quality:  Sudden   Timing:  Intermittent   Progression:  Unchanged Chronicity:  Recurrent Relieved by:  None tried Worsened by:  Nothing Ineffective treatments:  None tried Associated symptoms: no back pain and no fever       Past Medical History:  Diagnosis Date   Anemia    Anxiety    takes Xanax prn   Arthritis    Cataract    left eye   CKD (chronic kidney disease), stage IV (HCC)    Coronary artery disease 2016   cath w/ 90% LAD, 95%CFX, 80% OM 2, 60% RCA, not CABG candidate, rx medically   Full dentures    GERD (gastroesophageal reflux disease)    H/O hiatal hernia    Headache(784.0)    when b/p is elevated   Hemorrhoids    Hx of AKA (above knee amputation), right (HCC)    Hyperlipidemia    takes Simvastatin daily   Hypertension    takes Amlodipine/HCTZ/Losartan daily   Migraines    Myocardial infarction ([place]) 1990's   NSTEMI (non-ST elevated myocardial infarction) ([place])    Peripheral vascular disease (HCC)    PONV (postoperative nausea and vomiting)    Stroke ([place])    TIA history   Type II diabetes mellitus ([place])    on Lantus   Vertigo    takes Ativert prn    Patient  Active Problem List   Diagnosis Date Noted   Critical lower limb ischemia ([place]) 02/07/2020   Pleural effusion, bilateral 02/26/2019   History of non-ST elevation myocardial infarction (NSTEMI) 09/15/2018   Goals of care, counseling/discussion    Palliative care by specialist    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[facility])    Insulin-requiring or dependent type II diabetes mellitus ([place]) 01/11/2018   CAD in native artery    History of TIA (transient ischemic attack) 05/14/2015   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   Hyperkalemia    Essential hypertension    Hyperlipidemia LDL goal <70    Anemia 05/10/2012   Anxiety 05/09/2012   Peripheral vascular disease ([place])     Past Surgical History:  Procedure Laterality Date   ABDOMINAL AORTAGRAM N/A 02/29/2012   Procedure: ABDOMINAL Maxcine Ham;  Surgeon: [name], MD;  [facility] C[facility] LAB;  Service: Cardiovascular;  Laterality: N/A;   ABDOMINAL AORTOGRAM W/LOWER EXTREMITY N/A 02/11/2020   Procedure: ABDOMINAL AORTOGRAM W/LOWER EXTREMITY;  Surgeon: [name], MD;  Location: [facility] CV LAB;  Service: Cardiovascular;  Laterality: N/A;   ABDOMINAL AORTOGRAM W/LOWER EXTREMITY  N/A 03/17/2020   Procedure: ABDOMINAL AORTOGRAM W/LOWER EXTREMITY;  Surgeon: [name], MD;  Location: [place] CV LAB;  Service: Cardiovascular;  Laterality: N/A;   AMPUTATION  05/10/2012   Procedure: AMPUTATION ABOVE KNEE;  Surgeon: [name], MD;  Location[facility] OR;  Service: Vascular;  Laterality: Right;   open right groin wound noted   aortogram  02/2012   AV FISTULA PLACEMENT Right 03/04/2019   Procedure: ARTERIOVENOUS (AV) FISTULA CREATION;  Surgeon: Rosetta Posner, MD;  Location: [place];  Service: Vascular;  Laterality: Right;   CARDIAC CATHETERIZATION N/A 04/13/2015   Procedure: Right/Left Heart Cath and Coronary Angiography;  Surgeon: [name], MD;  Location: [facility] CV LAB;  Service: Cardiovascular;  Laterality: N/A;    CLEFT PALATE REPAIR     several   COLONOSCOPY     DILATION AND CURETTAGE OF UTERUS     FEMORAL-POPLITEAL BYPASS GRAFT  04/05/2012   Procedure: BYPASS GRAFT FEMORAL-POPLITEAL ARTERY;  Surgeon: [name], MD;  Location: [facility];  Service: Vascular;  Laterality: Right;  REVISION   FEMORAL-TIBIAL BYPASS GRAFT  03/29/2012   Procedure: BYPASS GRAFT FEMORAL-TIBIAL ARTERY;  Surgeon: [name], MD;  Location: [facility] OR;  Service: Vascular;  Laterality: Right;  Right femoral to Posterior Tibialis with composite graft of 62m x 80 cm ringed gortex graft and saphenous vein  ,intraoperative arteriogram   FEMOROPOPLITEAL THROMBECTOMY / EMBOLECTOMY  05/09/2012   IR FLUORO GUIDE CV LINE RIGHT  03/01/2019   IR [place] VASC ACCESS RIGHT  03/01/2019   MULTIPLE TOOTH EXTRACTIONS     MYOMECTOMY     PERIPHERAL VASCULAR INTERVENTION Left 03/17/2020   Procedure: PERIPHERAL VASCULAR INTERVENTION;  Surgeon: BSerafina Mitchell MD;  [place] [place];  Service: Cardiovascular;  Laterality: Left;  popliteal   PR VEIN BYPASS GRAFT,AORTO-FEM-POP  05/27/11   Right SFA-Below knee Pop BP   REVISION OF ARTERIOVENOUS GORETEX GRAFT Right 08/21/2019   Procedure: REVISION OF ARTERIOVENOUS FISTULA RIGHT ARM;  Surgeon: ERosetta Posner MD;  Location: [facility] OR;  Service: Vascular;  Laterality: Right;   TEE WITHOUT CARDIOVERSION N/A 01/03/2017   Procedure: TRANSESOPHAGEAL ECHOCARDIOGRAM (TEE);  Surgeon: TSkeet Latch MD;  [place]  Service: Cardiovascular;  Laterality: N/A;   THORACENTESIS  02/27/2019       TONSILLECTOMY     TUBAL LIGATION  ~ 1990     OB History   No obstetric history on file.     Family History  Problem Relation Age of Onset   Cancer Mother        breast   Diabetes Father    Cancer Brother    Heart attack Maternal Grandmother    Heart attack Maternal Grandfather     Social History   Tobacco Use   Smoking status: Former    Packs/day: 0.25    Years: 40.00    Pack years:  10.00    Types: Cigarettes    Quit date: 02/27/2012    Years since quitting: 9.1   Smokeless tobacco: Never  Vaping Use   Vaping Use: Never used  Substance Use Topics   Alcohol use: No   Drug use: No    Home Medications Prior to Admission medications   Medication Sig Start Date End Date Taking? Authorizing Provider  acetaminophen (TYLENOL) 500 MG tablet Take 500 mg by mouth every 6 (six) hours as needed for mild pain.    [provider]  ALPRAZolam (Duanne Moron 0.25 MG tablet  Take 1 tablet (0.25 mg total) by mouth 3 (three) times daily as needed for anxiety. 01/03/17   Delos Haring, PA-C  amLODipine (NORVASC) 10 MG tablet Take 10 mg by mouth daily. 06/04/20   [provider]  aspirin 81 MG chewable tablet Chew 81 mg by mouth daily.    [provider]  atorvastatin (LIPITOR) 80 MG tablet Take 80 mg by mouth daily.  01/09/18   [provider]  carvedilol (COREG) 25 MG tablet Take 25 mg by mouth daily.     [provider]  cholecalciferol (VITAMIN D) 25 MCG (1000 UT) tablet Take 1,000 Units by mouth daily.    [provider]  docusate sodium (COLACE) 100 MG capsule Take 100 mg by mouth daily as needed for mild constipation.    [provider]  insulin glargine (LANTUS) 100 UNIT/ML injection Inject 0.2 mLs (20 Units total) into the skin at bedtime. Patient taking differently: Inject 5 Units into the skin at bedtime. 03/06/19   Georgette Shell, MD  nitroGLYCERIN (NITROSTAT) 0.4 MG SL tablet PLACE 1 TABLET UNDER THE TONGUE EVERY 5 MINUTES FOR 3 DOSES AS NEEDED FOR CHEST PAIN 09/29/20   Skeet Latch, MD  RENVELA 800 MG tablet Take 800 mg by mouth 3 (three) times daily with meals.     [provider]  ticagrelor (BRILINTA) 90 MG TABS tablet Take 1 tablet (90 mg total) by mouth 2 (two) times daily. 07/13/20   [name], MD    Allergies    Plavix [clopidogrel bisulfate], Codeine, and Tylenol with codeine #3  [acetaminophen-codeine]  Review of Systems   Review of Systems  Constitutional:  Negative for fever.  Musculoskeletal:  Negative for back pain.  Skin:  Negative for wound.   Physical Exam Updated Vital Signs BP (!) 168/74 (BP Location: Left Arm)   Pulse 83   Temp 97.9 F (36.6 C) (Oral)   Resp 17   SpO2 96%   Physical Exam Constitutional:      Appearance: She is well-developed.  HENT:     Head: Normocephalic and atraumatic.  Eyes:     Conjunctiva/sclera: Conjunctivae normal.  Cardiovascular:     Rate and Rhythm: Normal rate and regular rhythm.  Pulmonary:     Effort: Pulmonary effort is normal.     Breath sounds: Normal breath sounds.  Abdominal:     Tenderness: There is no abdominal tenderness. There is no guarding or rebound.  Musculoskeletal:        General: Normal range of motion.     Cervical back: Neck supple.     Comments: Right AKA.  No tenderness on exam.  Full range of motion of hip.  No overlying cellulitis or induration.  Skin:    General: Skin is warm and dry.  Neurological:     General: No focal deficit present.     Mental Status: She is alert.     GCS: GCS eye subscore is 4. GCS verbal subscore is 5. GCS motor subscore is 6.    ED Results / Procedures / Treatments   Labs (all labs ordered are listed, but only abnormal results are displayed) Labs Reviewed - No data to display  EKG None  Radiology No results found.  Procedures Procedures   Medications Ordered in ED Medications  oxyCODONE-acetaminophen (PERCOCET/ROXICET) 5-325 MG per tablet 1 tablet (has no administration in time range)    ED Course  I have reviewed the triage vital signs and the nursing notes.  Pertinent labs & imaging results that were available during my care of the patient were reviewed by me and considered in my medical decision making (see chart for details).    MDM Rules/Calculators/A&P                            Final Clinical Impression(s) / ED  Diagnoses Final diagnoses:  Right leg pain    Rx / DC Orders ED Discharge Orders          Ordered    oxyCODONE-acetaminophen (PERCOCET/ROXICET) 5-325 MG tablet  Every 6 hours PRN        04/11/21 0742             Hayden Rasmussen, MD 04/12/21 [phone]

## 2021-04-11 NOTE — ED Triage Notes (Signed)
Pt C/O right leg pain. Pt stating "I have bursitis in this leg and it flares up." Denies injury to the leg.

## 2021-04-12 DIAGNOSIS — N186 End stage renal disease: Secondary | ICD-10-CM | POA: Diagnosis not present

## 2021-04-12 DIAGNOSIS — E785 Hyperlipidemia, unspecified: Secondary | ICD-10-CM | POA: Diagnosis not present

## 2021-04-12 DIAGNOSIS — D509 Iron deficiency anemia, unspecified: Secondary | ICD-10-CM | POA: Diagnosis not present

## 2021-04-12 DIAGNOSIS — D631 Anemia in chronic kidney disease: Secondary | ICD-10-CM | POA: Diagnosis not present

## 2021-04-12 DIAGNOSIS — E1121 Type 2 diabetes mellitus with diabetic nephropathy: Secondary | ICD-10-CM | POA: Diagnosis not present

## 2021-04-12 DIAGNOSIS — N2581 Secondary hyperparathyroidism of renal origin: Secondary | ICD-10-CM | POA: Diagnosis not present

## 2021-04-13 ENCOUNTER — Other Ambulatory Visit: Payer: Self-pay | Admitting: Cardiovascular Disease

## 2021-04-14 DIAGNOSIS — E1121 Type 2 diabetes mellitus with diabetic nephropathy: Secondary | ICD-10-CM | POA: Diagnosis not present

## 2021-04-14 DIAGNOSIS — D509 Iron deficiency anemia, unspecified: Secondary | ICD-10-CM | POA: Diagnosis not present

## 2021-04-14 DIAGNOSIS — E785 Hyperlipidemia, unspecified: Secondary | ICD-10-CM | POA: Diagnosis not present

## 2021-04-14 DIAGNOSIS — N2581 Secondary hyperparathyroidism of renal origin: Secondary | ICD-10-CM | POA: Diagnosis not present

## 2021-04-14 DIAGNOSIS — N186 End stage renal disease: Secondary | ICD-10-CM | POA: Diagnosis not present

## 2021-04-14 DIAGNOSIS — D631 Anemia in chronic kidney disease: Secondary | ICD-10-CM | POA: Diagnosis not present

## 2021-04-14 NOTE — Telephone Encounter (Signed)
Rx(s) sent to pharmacy electronically.  

## 2021-04-16 DIAGNOSIS — E785 Hyperlipidemia, unspecified: Secondary | ICD-10-CM | POA: Diagnosis not present

## 2021-04-16 DIAGNOSIS — N186 End stage renal disease: Secondary | ICD-10-CM | POA: Diagnosis not present

## 2021-04-16 DIAGNOSIS — D509 Iron deficiency anemia, unspecified: Secondary | ICD-10-CM | POA: Diagnosis not present

## 2021-04-16 DIAGNOSIS — E1121 Type 2 diabetes mellitus with diabetic nephropathy: Secondary | ICD-10-CM | POA: Diagnosis not present

## 2021-04-16 DIAGNOSIS — D631 Anemia in chronic kidney disease: Secondary | ICD-10-CM | POA: Diagnosis not present

## 2021-04-16 DIAGNOSIS — N2581 Secondary hyperparathyroidism of renal origin: Secondary | ICD-10-CM | POA: Diagnosis not present

## 2021-04-18 DIAGNOSIS — Z992 Dependence on renal dialysis: Secondary | ICD-10-CM | POA: Diagnosis not present

## 2021-04-18 DIAGNOSIS — N186 End stage renal disease: Secondary | ICD-10-CM | POA: Diagnosis not present

## 2021-04-19 ENCOUNTER — Other Ambulatory Visit: Payer: Self-pay | Admitting: Cardiovascular Disease

## 2021-04-19 DIAGNOSIS — D509 Iron deficiency anemia, unspecified: Secondary | ICD-10-CM | POA: Diagnosis not present

## 2021-04-19 DIAGNOSIS — E1121 Type 2 diabetes mellitus with diabetic nephropathy: Secondary | ICD-10-CM | POA: Diagnosis not present

## 2021-04-19 DIAGNOSIS — N2581 Secondary hyperparathyroidism of renal origin: Secondary | ICD-10-CM | POA: Diagnosis not present

## 2021-04-19 DIAGNOSIS — D631 Anemia in chronic kidney disease: Secondary | ICD-10-CM | POA: Diagnosis not present

## 2021-04-19 DIAGNOSIS — N186 End stage renal disease: Secondary | ICD-10-CM | POA: Diagnosis not present

## 2021-04-21 DIAGNOSIS — E1121 Type 2 diabetes mellitus with diabetic nephropathy: Secondary | ICD-10-CM | POA: Diagnosis not present

## 2021-04-21 DIAGNOSIS — N186 End stage renal disease: Secondary | ICD-10-CM | POA: Diagnosis not present

## 2021-04-21 DIAGNOSIS — D631 Anemia in chronic kidney disease: Secondary | ICD-10-CM | POA: Diagnosis not present

## 2021-04-21 DIAGNOSIS — N2581 Secondary hyperparathyroidism of renal origin: Secondary | ICD-10-CM | POA: Diagnosis not present

## 2021-04-21 DIAGNOSIS — D509 Iron deficiency anemia, unspecified: Secondary | ICD-10-CM | POA: Diagnosis not present

## 2021-04-23 ENCOUNTER — Encounter (HOSPITAL_COMMUNITY): Payer: Medicare Other

## 2021-04-23 ENCOUNTER — Other Ambulatory Visit (HOSPITAL_COMMUNITY): Payer: Medicare Other

## 2021-04-23 DIAGNOSIS — E1121 Type 2 diabetes mellitus with diabetic nephropathy: Secondary | ICD-10-CM | POA: Diagnosis not present

## 2021-04-23 DIAGNOSIS — D631 Anemia in chronic kidney disease: Secondary | ICD-10-CM | POA: Diagnosis not present

## 2021-04-23 DIAGNOSIS — N186 End stage renal disease: Secondary | ICD-10-CM | POA: Diagnosis not present

## 2021-04-23 DIAGNOSIS — D509 Iron deficiency anemia, unspecified: Secondary | ICD-10-CM | POA: Diagnosis not present

## 2021-04-23 DIAGNOSIS — N2581 Secondary hyperparathyroidism of renal origin: Secondary | ICD-10-CM | POA: Diagnosis not present

## 2021-04-26 DIAGNOSIS — N186 End stage renal disease: Secondary | ICD-10-CM | POA: Diagnosis not present

## 2021-04-26 DIAGNOSIS — D509 Iron deficiency anemia, unspecified: Secondary | ICD-10-CM | POA: Diagnosis not present

## 2021-04-26 DIAGNOSIS — N2581 Secondary hyperparathyroidism of renal origin: Secondary | ICD-10-CM | POA: Diagnosis not present

## 2021-04-26 DIAGNOSIS — E1121 Type 2 diabetes mellitus with diabetic nephropathy: Secondary | ICD-10-CM | POA: Diagnosis not present

## 2021-04-26 DIAGNOSIS — D631 Anemia in chronic kidney disease: Secondary | ICD-10-CM | POA: Diagnosis not present

## 2021-04-28 ENCOUNTER — Other Ambulatory Visit: Payer: Self-pay | Admitting: Cardiovascular Disease

## 2021-04-28 DIAGNOSIS — E1121 Type 2 diabetes mellitus with diabetic nephropathy: Secondary | ICD-10-CM | POA: Diagnosis not present

## 2021-04-28 DIAGNOSIS — D631 Anemia in chronic kidney disease: Secondary | ICD-10-CM | POA: Diagnosis not present

## 2021-04-28 DIAGNOSIS — N2581 Secondary hyperparathyroidism of renal origin: Secondary | ICD-10-CM | POA: Diagnosis not present

## 2021-04-28 DIAGNOSIS — N186 End stage renal disease: Secondary | ICD-10-CM | POA: Diagnosis not present

## 2021-04-28 DIAGNOSIS — D509 Iron deficiency anemia, unspecified: Secondary | ICD-10-CM | POA: Diagnosis not present

## 2021-04-30 DIAGNOSIS — D631 Anemia in chronic kidney disease: Secondary | ICD-10-CM | POA: Diagnosis not present

## 2021-04-30 DIAGNOSIS — N186 End stage renal disease: Secondary | ICD-10-CM | POA: Diagnosis not present

## 2021-04-30 DIAGNOSIS — N2581 Secondary hyperparathyroidism of renal origin: Secondary | ICD-10-CM | POA: Diagnosis not present

## 2021-04-30 DIAGNOSIS — E1121 Type 2 diabetes mellitus with diabetic nephropathy: Secondary | ICD-10-CM | POA: Diagnosis not present

## 2021-04-30 DIAGNOSIS — D509 Iron deficiency anemia, unspecified: Secondary | ICD-10-CM | POA: Diagnosis not present

## 2021-05-03 DIAGNOSIS — N186 End stage renal disease: Secondary | ICD-10-CM | POA: Diagnosis not present

## 2021-05-03 DIAGNOSIS — D509 Iron deficiency anemia, unspecified: Secondary | ICD-10-CM | POA: Diagnosis not present

## 2021-05-03 DIAGNOSIS — D631 Anemia in chronic kidney disease: Secondary | ICD-10-CM | POA: Diagnosis not present

## 2021-05-03 DIAGNOSIS — E1121 Type 2 diabetes mellitus with diabetic nephropathy: Secondary | ICD-10-CM | POA: Diagnosis not present

## 2021-05-03 DIAGNOSIS — N2581 Secondary hyperparathyroidism of renal origin: Secondary | ICD-10-CM | POA: Diagnosis not present

## 2021-05-05 DIAGNOSIS — N2581 Secondary hyperparathyroidism of renal origin: Secondary | ICD-10-CM | POA: Diagnosis not present

## 2021-05-05 DIAGNOSIS — D631 Anemia in chronic kidney disease: Secondary | ICD-10-CM | POA: Diagnosis not present

## 2021-05-05 DIAGNOSIS — N186 End stage renal disease: Secondary | ICD-10-CM | POA: Diagnosis not present

## 2021-05-05 DIAGNOSIS — E1121 Type 2 diabetes mellitus with diabetic nephropathy: Secondary | ICD-10-CM | POA: Diagnosis not present

## 2021-05-05 DIAGNOSIS — D509 Iron deficiency anemia, unspecified: Secondary | ICD-10-CM | POA: Diagnosis not present

## 2021-05-07 DIAGNOSIS — N2581 Secondary hyperparathyroidism of renal origin: Secondary | ICD-10-CM | POA: Diagnosis not present

## 2021-05-07 DIAGNOSIS — D509 Iron deficiency anemia, unspecified: Secondary | ICD-10-CM | POA: Diagnosis not present

## 2021-05-07 DIAGNOSIS — E1121 Type 2 diabetes mellitus with diabetic nephropathy: Secondary | ICD-10-CM | POA: Diagnosis not present

## 2021-05-07 DIAGNOSIS — D631 Anemia in chronic kidney disease: Secondary | ICD-10-CM | POA: Diagnosis not present

## 2021-05-07 DIAGNOSIS — N186 End stage renal disease: Secondary | ICD-10-CM | POA: Diagnosis not present

## 2021-05-10 DIAGNOSIS — D509 Iron deficiency anemia, unspecified: Secondary | ICD-10-CM | POA: Diagnosis not present

## 2021-05-10 DIAGNOSIS — D631 Anemia in chronic kidney disease: Secondary | ICD-10-CM | POA: Diagnosis not present

## 2021-05-10 DIAGNOSIS — N186 End stage renal disease: Secondary | ICD-10-CM | POA: Diagnosis not present

## 2021-05-10 DIAGNOSIS — E1121 Type 2 diabetes mellitus with diabetic nephropathy: Secondary | ICD-10-CM | POA: Diagnosis not present

## 2021-05-10 DIAGNOSIS — N2581 Secondary hyperparathyroidism of renal origin: Secondary | ICD-10-CM | POA: Diagnosis not present

## 2021-05-12 DIAGNOSIS — N2581 Secondary hyperparathyroidism of renal origin: Secondary | ICD-10-CM | POA: Diagnosis not present

## 2021-05-12 DIAGNOSIS — D631 Anemia in chronic kidney disease: Secondary | ICD-10-CM | POA: Diagnosis not present

## 2021-05-12 DIAGNOSIS — N186 End stage renal disease: Secondary | ICD-10-CM | POA: Diagnosis not present

## 2021-05-12 DIAGNOSIS — E1121 Type 2 diabetes mellitus with diabetic nephropathy: Secondary | ICD-10-CM | POA: Diagnosis not present

## 2021-05-12 DIAGNOSIS — D509 Iron deficiency anemia, unspecified: Secondary | ICD-10-CM | POA: Diagnosis not present

## 2021-05-14 DIAGNOSIS — D509 Iron deficiency anemia, unspecified: Secondary | ICD-10-CM | POA: Diagnosis not present

## 2021-05-14 DIAGNOSIS — N2581 Secondary hyperparathyroidism of renal origin: Secondary | ICD-10-CM | POA: Diagnosis not present

## 2021-05-14 DIAGNOSIS — E1121 Type 2 diabetes mellitus with diabetic nephropathy: Secondary | ICD-10-CM | POA: Diagnosis not present

## 2021-05-14 DIAGNOSIS — N186 End stage renal disease: Secondary | ICD-10-CM | POA: Diagnosis not present

## 2021-05-14 DIAGNOSIS — D631 Anemia in chronic kidney disease: Secondary | ICD-10-CM | POA: Diagnosis not present

## 2021-05-17 DIAGNOSIS — D631 Anemia in chronic kidney disease: Secondary | ICD-10-CM | POA: Diagnosis not present

## 2021-05-17 DIAGNOSIS — D509 Iron deficiency anemia, unspecified: Secondary | ICD-10-CM | POA: Diagnosis not present

## 2021-05-17 DIAGNOSIS — N2581 Secondary hyperparathyroidism of renal origin: Secondary | ICD-10-CM | POA: Diagnosis not present

## 2021-05-17 DIAGNOSIS — N186 End stage renal disease: Secondary | ICD-10-CM | POA: Diagnosis not present

## 2021-05-17 DIAGNOSIS — E1121 Type 2 diabetes mellitus with diabetic nephropathy: Secondary | ICD-10-CM | POA: Diagnosis not present

## 2021-05-19 DIAGNOSIS — E1121 Type 2 diabetes mellitus with diabetic nephropathy: Secondary | ICD-10-CM | POA: Diagnosis not present

## 2021-05-19 DIAGNOSIS — D509 Iron deficiency anemia, unspecified: Secondary | ICD-10-CM | POA: Diagnosis not present

## 2021-05-19 DIAGNOSIS — N186 End stage renal disease: Secondary | ICD-10-CM | POA: Diagnosis not present

## 2021-05-19 DIAGNOSIS — Z992 Dependence on renal dialysis: Secondary | ICD-10-CM | POA: Diagnosis not present

## 2021-05-19 DIAGNOSIS — N2581 Secondary hyperparathyroidism of renal origin: Secondary | ICD-10-CM | POA: Diagnosis not present

## 2021-05-19 DIAGNOSIS — D631 Anemia in chronic kidney disease: Secondary | ICD-10-CM | POA: Diagnosis not present

## 2021-05-21 DIAGNOSIS — N2581 Secondary hyperparathyroidism of renal origin: Secondary | ICD-10-CM | POA: Diagnosis not present

## 2021-05-21 DIAGNOSIS — D509 Iron deficiency anemia, unspecified: Secondary | ICD-10-CM | POA: Diagnosis not present

## 2021-05-21 DIAGNOSIS — E1121 Type 2 diabetes mellitus with diabetic nephropathy: Secondary | ICD-10-CM | POA: Diagnosis not present

## 2021-05-21 DIAGNOSIS — D631 Anemia in chronic kidney disease: Secondary | ICD-10-CM | POA: Diagnosis not present

## 2021-05-21 DIAGNOSIS — N186 End stage renal disease: Secondary | ICD-10-CM | POA: Diagnosis not present

## 2021-05-23 DIAGNOSIS — Z20822 Contact with and (suspected) exposure to covid-19: Secondary | ICD-10-CM | POA: Diagnosis not present

## 2021-05-25 DIAGNOSIS — Z992 Dependence on renal dialysis: Secondary | ICD-10-CM | POA: Diagnosis not present

## 2021-05-25 DIAGNOSIS — N186 End stage renal disease: Secondary | ICD-10-CM | POA: Diagnosis not present

## 2021-05-25 DIAGNOSIS — D509 Iron deficiency anemia, unspecified: Secondary | ICD-10-CM | POA: Diagnosis not present

## 2021-05-27 DIAGNOSIS — N186 End stage renal disease: Secondary | ICD-10-CM | POA: Diagnosis not present

## 2021-05-27 DIAGNOSIS — D509 Iron deficiency anemia, unspecified: Secondary | ICD-10-CM | POA: Diagnosis not present

## 2021-05-27 DIAGNOSIS — Z992 Dependence on renal dialysis: Secondary | ICD-10-CM | POA: Diagnosis not present

## 2021-05-29 DIAGNOSIS — D509 Iron deficiency anemia, unspecified: Secondary | ICD-10-CM | POA: Diagnosis not present

## 2021-05-29 DIAGNOSIS — N186 End stage renal disease: Secondary | ICD-10-CM | POA: Diagnosis not present

## 2021-05-29 DIAGNOSIS — Z992 Dependence on renal dialysis: Secondary | ICD-10-CM | POA: Diagnosis not present

## 2021-06-01 DIAGNOSIS — Z992 Dependence on renal dialysis: Secondary | ICD-10-CM | POA: Diagnosis not present

## 2021-06-01 DIAGNOSIS — N186 End stage renal disease: Secondary | ICD-10-CM | POA: Diagnosis not present

## 2021-06-01 DIAGNOSIS — D509 Iron deficiency anemia, unspecified: Secondary | ICD-10-CM | POA: Diagnosis not present

## 2021-06-03 DIAGNOSIS — N186 End stage renal disease: Secondary | ICD-10-CM | POA: Diagnosis not present

## 2021-06-03 DIAGNOSIS — Z992 Dependence on renal dialysis: Secondary | ICD-10-CM | POA: Diagnosis not present

## 2021-06-03 DIAGNOSIS — D509 Iron deficiency anemia, unspecified: Secondary | ICD-10-CM | POA: Diagnosis not present

## 2021-06-05 DIAGNOSIS — Z992 Dependence on renal dialysis: Secondary | ICD-10-CM | POA: Diagnosis not present

## 2021-06-05 DIAGNOSIS — D509 Iron deficiency anemia, unspecified: Secondary | ICD-10-CM | POA: Diagnosis not present

## 2021-06-05 DIAGNOSIS — N186 End stage renal disease: Secondary | ICD-10-CM | POA: Diagnosis not present

## 2021-06-08 DIAGNOSIS — Z992 Dependence on renal dialysis: Secondary | ICD-10-CM | POA: Diagnosis not present

## 2021-06-08 DIAGNOSIS — D509 Iron deficiency anemia, unspecified: Secondary | ICD-10-CM | POA: Diagnosis not present

## 2021-06-08 DIAGNOSIS — N186 End stage renal disease: Secondary | ICD-10-CM | POA: Diagnosis not present

## 2021-06-10 DIAGNOSIS — Z992 Dependence on renal dialysis: Secondary | ICD-10-CM | POA: Diagnosis not present

## 2021-06-10 DIAGNOSIS — D509 Iron deficiency anemia, unspecified: Secondary | ICD-10-CM | POA: Diagnosis not present

## 2021-06-10 DIAGNOSIS — N186 End stage renal disease: Secondary | ICD-10-CM | POA: Diagnosis not present

## 2021-06-14 DIAGNOSIS — D631 Anemia in chronic kidney disease: Secondary | ICD-10-CM | POA: Diagnosis not present

## 2021-06-14 DIAGNOSIS — E1121 Type 2 diabetes mellitus with diabetic nephropathy: Secondary | ICD-10-CM | POA: Diagnosis not present

## 2021-06-14 DIAGNOSIS — N186 End stage renal disease: Secondary | ICD-10-CM | POA: Diagnosis not present

## 2021-06-14 DIAGNOSIS — D509 Iron deficiency anemia, unspecified: Secondary | ICD-10-CM | POA: Diagnosis not present

## 2021-06-14 DIAGNOSIS — N2581 Secondary hyperparathyroidism of renal origin: Secondary | ICD-10-CM | POA: Diagnosis not present

## 2021-06-16 DIAGNOSIS — E1121 Type 2 diabetes mellitus with diabetic nephropathy: Secondary | ICD-10-CM | POA: Diagnosis not present

## 2021-06-16 DIAGNOSIS — D631 Anemia in chronic kidney disease: Secondary | ICD-10-CM | POA: Diagnosis not present

## 2021-06-16 DIAGNOSIS — D509 Iron deficiency anemia, unspecified: Secondary | ICD-10-CM | POA: Diagnosis not present

## 2021-06-16 DIAGNOSIS — N2581 Secondary hyperparathyroidism of renal origin: Secondary | ICD-10-CM | POA: Diagnosis not present

## 2021-06-16 DIAGNOSIS — N186 End stage renal disease: Secondary | ICD-10-CM | POA: Diagnosis not present

## 2021-06-18 DIAGNOSIS — N186 End stage renal disease: Secondary | ICD-10-CM | POA: Diagnosis not present

## 2021-06-18 DIAGNOSIS — Z992 Dependence on renal dialysis: Secondary | ICD-10-CM | POA: Diagnosis not present

## 2021-06-18 DIAGNOSIS — D509 Iron deficiency anemia, unspecified: Secondary | ICD-10-CM | POA: Diagnosis not present

## 2021-06-18 DIAGNOSIS — N2581 Secondary hyperparathyroidism of renal origin: Secondary | ICD-10-CM | POA: Diagnosis not present

## 2021-06-18 DIAGNOSIS — D631 Anemia in chronic kidney disease: Secondary | ICD-10-CM | POA: Diagnosis not present

## 2021-06-18 DIAGNOSIS — E1121 Type 2 diabetes mellitus with diabetic nephropathy: Secondary | ICD-10-CM | POA: Diagnosis not present

## 2021-06-21 DIAGNOSIS — D509 Iron deficiency anemia, unspecified: Secondary | ICD-10-CM | POA: Diagnosis not present

## 2021-06-21 DIAGNOSIS — N2581 Secondary hyperparathyroidism of renal origin: Secondary | ICD-10-CM | POA: Diagnosis not present

## 2021-06-21 DIAGNOSIS — D631 Anemia in chronic kidney disease: Secondary | ICD-10-CM | POA: Diagnosis not present

## 2021-06-21 DIAGNOSIS — E1121 Type 2 diabetes mellitus with diabetic nephropathy: Secondary | ICD-10-CM | POA: Diagnosis not present

## 2021-06-21 DIAGNOSIS — N186 End stage renal disease: Secondary | ICD-10-CM | POA: Diagnosis not present

## 2021-06-21 DIAGNOSIS — E785 Hyperlipidemia, unspecified: Secondary | ICD-10-CM | POA: Diagnosis not present

## 2021-06-23 DIAGNOSIS — N2581 Secondary hyperparathyroidism of renal origin: Secondary | ICD-10-CM | POA: Diagnosis not present

## 2021-06-23 DIAGNOSIS — D631 Anemia in chronic kidney disease: Secondary | ICD-10-CM | POA: Diagnosis not present

## 2021-06-23 DIAGNOSIS — D509 Iron deficiency anemia, unspecified: Secondary | ICD-10-CM | POA: Diagnosis not present

## 2021-06-23 DIAGNOSIS — N186 End stage renal disease: Secondary | ICD-10-CM | POA: Diagnosis not present

## 2021-06-23 DIAGNOSIS — E785 Hyperlipidemia, unspecified: Secondary | ICD-10-CM | POA: Diagnosis not present

## 2021-06-23 DIAGNOSIS — E1121 Type 2 diabetes mellitus with diabetic nephropathy: Secondary | ICD-10-CM | POA: Diagnosis not present

## 2021-06-25 DIAGNOSIS — N2581 Secondary hyperparathyroidism of renal origin: Secondary | ICD-10-CM | POA: Diagnosis not present

## 2021-06-25 DIAGNOSIS — D509 Iron deficiency anemia, unspecified: Secondary | ICD-10-CM | POA: Diagnosis not present

## 2021-06-25 DIAGNOSIS — E1121 Type 2 diabetes mellitus with diabetic nephropathy: Secondary | ICD-10-CM | POA: Diagnosis not present

## 2021-06-25 DIAGNOSIS — N186 End stage renal disease: Secondary | ICD-10-CM | POA: Diagnosis not present

## 2021-06-25 DIAGNOSIS — D631 Anemia in chronic kidney disease: Secondary | ICD-10-CM | POA: Diagnosis not present

## 2021-06-25 DIAGNOSIS — E785 Hyperlipidemia, unspecified: Secondary | ICD-10-CM | POA: Diagnosis not present

## 2021-06-28 DIAGNOSIS — E1121 Type 2 diabetes mellitus with diabetic nephropathy: Secondary | ICD-10-CM | POA: Diagnosis not present

## 2021-06-28 DIAGNOSIS — E785 Hyperlipidemia, unspecified: Secondary | ICD-10-CM | POA: Diagnosis not present

## 2021-06-28 DIAGNOSIS — D509 Iron deficiency anemia, unspecified: Secondary | ICD-10-CM | POA: Diagnosis not present

## 2021-06-28 DIAGNOSIS — N2581 Secondary hyperparathyroidism of renal origin: Secondary | ICD-10-CM | POA: Diagnosis not present

## 2021-06-28 DIAGNOSIS — N186 End stage renal disease: Secondary | ICD-10-CM | POA: Diagnosis not present

## 2021-06-28 DIAGNOSIS — D631 Anemia in chronic kidney disease: Secondary | ICD-10-CM | POA: Diagnosis not present

## 2021-06-29 DIAGNOSIS — E7211 Homocystinuria: Secondary | ICD-10-CM | POA: Insufficient documentation

## 2021-06-29 DIAGNOSIS — I159 Secondary hypertension, unspecified: Secondary | ICD-10-CM | POA: Diagnosis not present

## 2021-06-29 DIAGNOSIS — S78112A Complete traumatic amputation at level between left hip and knee, initial encounter: Secondary | ICD-10-CM | POA: Insufficient documentation

## 2021-06-29 DIAGNOSIS — I83024 Varicose veins of left lower extremity with ulcer of heel and midfoot: Secondary | ICD-10-CM | POA: Diagnosis not present

## 2021-06-29 DIAGNOSIS — L97421 Non-pressure chronic ulcer of left heel and midfoot limited to breakdown of skin: Secondary | ICD-10-CM | POA: Diagnosis not present

## 2021-06-29 DIAGNOSIS — E1165 Type 2 diabetes mellitus with hyperglycemia: Secondary | ICD-10-CM | POA: Insufficient documentation

## 2021-06-29 DIAGNOSIS — I739 Peripheral vascular disease, unspecified: Secondary | ICD-10-CM | POA: Diagnosis not present

## 2021-06-30 ENCOUNTER — Other Ambulatory Visit (HOSPITAL_COMMUNITY): Payer: Self-pay | Admitting: Surgery

## 2021-06-30 DIAGNOSIS — I739 Peripheral vascular disease, unspecified: Secondary | ICD-10-CM

## 2021-06-30 DIAGNOSIS — E785 Hyperlipidemia, unspecified: Secondary | ICD-10-CM | POA: Diagnosis not present

## 2021-06-30 DIAGNOSIS — N2581 Secondary hyperparathyroidism of renal origin: Secondary | ICD-10-CM | POA: Diagnosis not present

## 2021-06-30 DIAGNOSIS — E1121 Type 2 diabetes mellitus with diabetic nephropathy: Secondary | ICD-10-CM | POA: Diagnosis not present

## 2021-06-30 DIAGNOSIS — D631 Anemia in chronic kidney disease: Secondary | ICD-10-CM | POA: Diagnosis not present

## 2021-06-30 DIAGNOSIS — N186 End stage renal disease: Secondary | ICD-10-CM | POA: Diagnosis not present

## 2021-06-30 DIAGNOSIS — D509 Iron deficiency anemia, unspecified: Secondary | ICD-10-CM | POA: Diagnosis not present

## 2021-07-01 ENCOUNTER — Inpatient Hospital Stay (HOSPITAL_COMMUNITY): Admission: RE | Admit: 2021-07-01 | Payer: Medicare Other | Source: Ambulatory Visit

## 2021-07-01 ENCOUNTER — Ambulatory Visit: Payer: Medicare Other

## 2021-07-01 DIAGNOSIS — E559 Vitamin D deficiency, unspecified: Secondary | ICD-10-CM | POA: Insufficient documentation

## 2021-07-01 DIAGNOSIS — E538 Deficiency of other specified B group vitamins: Secondary | ICD-10-CM | POA: Insufficient documentation

## 2021-07-01 DIAGNOSIS — R1013 Epigastric pain: Secondary | ICD-10-CM | POA: Insufficient documentation

## 2021-07-01 DIAGNOSIS — Z794 Long term (current) use of insulin: Secondary | ICD-10-CM | POA: Insufficient documentation

## 2021-07-02 DIAGNOSIS — N2581 Secondary hyperparathyroidism of renal origin: Secondary | ICD-10-CM | POA: Diagnosis not present

## 2021-07-02 DIAGNOSIS — E1121 Type 2 diabetes mellitus with diabetic nephropathy: Secondary | ICD-10-CM | POA: Diagnosis not present

## 2021-07-02 DIAGNOSIS — D631 Anemia in chronic kidney disease: Secondary | ICD-10-CM | POA: Diagnosis not present

## 2021-07-02 DIAGNOSIS — E785 Hyperlipidemia, unspecified: Secondary | ICD-10-CM | POA: Diagnosis not present

## 2021-07-02 DIAGNOSIS — N186 End stage renal disease: Secondary | ICD-10-CM | POA: Diagnosis not present

## 2021-07-02 DIAGNOSIS — D509 Iron deficiency anemia, unspecified: Secondary | ICD-10-CM | POA: Diagnosis not present

## 2021-07-05 ENCOUNTER — Encounter (HOSPITAL_COMMUNITY): Payer: Medicare Other

## 2021-07-05 ENCOUNTER — Ambulatory Visit: Payer: Medicare Other

## 2021-07-05 ENCOUNTER — Other Ambulatory Visit (HOSPITAL_COMMUNITY): Payer: Medicare Other

## 2021-07-05 DIAGNOSIS — E785 Hyperlipidemia, unspecified: Secondary | ICD-10-CM | POA: Diagnosis not present

## 2021-07-05 DIAGNOSIS — D631 Anemia in chronic kidney disease: Secondary | ICD-10-CM | POA: Diagnosis not present

## 2021-07-05 DIAGNOSIS — E1121 Type 2 diabetes mellitus with diabetic nephropathy: Secondary | ICD-10-CM | POA: Diagnosis not present

## 2021-07-05 DIAGNOSIS — D509 Iron deficiency anemia, unspecified: Secondary | ICD-10-CM | POA: Diagnosis not present

## 2021-07-05 DIAGNOSIS — N186 End stage renal disease: Secondary | ICD-10-CM | POA: Diagnosis not present

## 2021-07-05 DIAGNOSIS — N2581 Secondary hyperparathyroidism of renal origin: Secondary | ICD-10-CM | POA: Diagnosis not present

## 2021-07-07 DIAGNOSIS — N2581 Secondary hyperparathyroidism of renal origin: Secondary | ICD-10-CM | POA: Diagnosis not present

## 2021-07-07 DIAGNOSIS — E785 Hyperlipidemia, unspecified: Secondary | ICD-10-CM | POA: Diagnosis not present

## 2021-07-07 DIAGNOSIS — D631 Anemia in chronic kidney disease: Secondary | ICD-10-CM | POA: Diagnosis not present

## 2021-07-07 DIAGNOSIS — N186 End stage renal disease: Secondary | ICD-10-CM | POA: Diagnosis not present

## 2021-07-07 DIAGNOSIS — D509 Iron deficiency anemia, unspecified: Secondary | ICD-10-CM | POA: Diagnosis not present

## 2021-07-07 DIAGNOSIS — E1121 Type 2 diabetes mellitus with diabetic nephropathy: Secondary | ICD-10-CM | POA: Diagnosis not present

## 2021-07-09 DIAGNOSIS — E785 Hyperlipidemia, unspecified: Secondary | ICD-10-CM | POA: Diagnosis not present

## 2021-07-09 DIAGNOSIS — D631 Anemia in chronic kidney disease: Secondary | ICD-10-CM | POA: Diagnosis not present

## 2021-07-09 DIAGNOSIS — E1121 Type 2 diabetes mellitus with diabetic nephropathy: Secondary | ICD-10-CM | POA: Diagnosis not present

## 2021-07-09 DIAGNOSIS — N2581 Secondary hyperparathyroidism of renal origin: Secondary | ICD-10-CM | POA: Diagnosis not present

## 2021-07-09 DIAGNOSIS — D509 Iron deficiency anemia, unspecified: Secondary | ICD-10-CM | POA: Diagnosis not present

## 2021-07-09 DIAGNOSIS — N186 End stage renal disease: Secondary | ICD-10-CM | POA: Diagnosis not present

## 2021-07-12 DIAGNOSIS — D509 Iron deficiency anemia, unspecified: Secondary | ICD-10-CM | POA: Diagnosis not present

## 2021-07-12 DIAGNOSIS — E1121 Type 2 diabetes mellitus with diabetic nephropathy: Secondary | ICD-10-CM | POA: Diagnosis not present

## 2021-07-12 DIAGNOSIS — D631 Anemia in chronic kidney disease: Secondary | ICD-10-CM | POA: Diagnosis not present

## 2021-07-12 DIAGNOSIS — E785 Hyperlipidemia, unspecified: Secondary | ICD-10-CM | POA: Diagnosis not present

## 2021-07-12 DIAGNOSIS — N186 End stage renal disease: Secondary | ICD-10-CM | POA: Diagnosis not present

## 2021-07-12 DIAGNOSIS — N2581 Secondary hyperparathyroidism of renal origin: Secondary | ICD-10-CM | POA: Diagnosis not present

## 2021-07-14 ENCOUNTER — Ambulatory Visit (INDEPENDENT_AMBULATORY_CARE_PROVIDER_SITE_OTHER)
Admission: RE | Admit: 2021-07-14 | Discharge: 2021-07-14 | Disposition: A | Discharge status: Home / Self Care | Payer: Medicare Other | Source: Ambulatory Visit | Attending: Surgery | Admitting: Surgery

## 2021-07-14 ENCOUNTER — Ambulatory Visit (INDEPENDENT_AMBULATORY_CARE_PROVIDER_SITE_OTHER): Payer: Medicare Other | Admitting: Physician Assistant

## 2021-07-14 ENCOUNTER — Ambulatory Visit (HOSPITAL_COMMUNITY)
Admission: RE | Admit: 2021-07-14 | Discharge: 2021-07-14 | Disposition: A | Discharge status: Home / Self Care | Payer: Medicare Other | Source: Ambulatory Visit | Attending: Surgery | Admitting: Surgery

## 2021-07-14 ENCOUNTER — Other Ambulatory Visit: Payer: Self-pay

## 2021-07-14 VITALS — BP 168/79 | HR 82 | Temp 98.2°F

## 2021-07-14 DIAGNOSIS — N186 End stage renal disease: Secondary | ICD-10-CM

## 2021-07-14 DIAGNOSIS — E1121 Type 2 diabetes mellitus with diabetic nephropathy: Secondary | ICD-10-CM | POA: Diagnosis not present

## 2021-07-14 DIAGNOSIS — I739 Peripheral vascular disease, unspecified: Secondary | ICD-10-CM | POA: Insufficient documentation

## 2021-07-14 DIAGNOSIS — D631 Anemia in chronic kidney disease: Secondary | ICD-10-CM | POA: Diagnosis not present

## 2021-07-14 DIAGNOSIS — Z992 Dependence on renal dialysis: Secondary | ICD-10-CM

## 2021-07-14 DIAGNOSIS — D509 Iron deficiency anemia, unspecified: Secondary | ICD-10-CM | POA: Diagnosis not present

## 2021-07-14 DIAGNOSIS — N2581 Secondary hyperparathyroidism of renal origin: Secondary | ICD-10-CM | POA: Diagnosis not present

## 2021-07-14 DIAGNOSIS — I2510 Atherosclerotic heart disease of native coronary artery without angina pectoris: Secondary | ICD-10-CM

## 2021-07-14 DIAGNOSIS — E785 Hyperlipidemia, unspecified: Secondary | ICD-10-CM | POA: Diagnosis not present

## 2021-07-14 NOTE — Progress Notes (Signed)
[facility] [place] HISTORY AND PHYSICAL   History of Present Illness:  Patient is a 70 y.o. year old female who presents for evaluation of PAD.  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  Left LE s/p left SFA and popliteal artery stent placement for non healing foot wound.  On her last visit 04/21/20 the left foot wound was healed.    She states she has a new place on her left lateral heel that happened when she hit on her WC.  It is healing and she denise rest pain.  She has been using antibiotic ointment.  She denise fever and chills.  No drainage or erythema.     Pt has ESRD and dialyzes in [facility] on M/W/F.    The pt is on a statin for cholesterol management.    The pt is on an aspirin.    Other AC:  Brilinta The pt is on BB for hypertension.  The pt does have diabetes. Tobacco hx:  Former-quit 2013  Past Medical History:  Diagnosis Date   Anemia    Anxiety    takes Xanax prn   Arthritis    Cataract    left eye   CKD (chronic kidney disease), stage IV (HCC)    Coronary artery disease 2016   cath w/ 90% LAD, 95%CFX, 80% OM 2, 60% RCA, not CABG candidate, rx medically   Full dentures    GERD (gastroesophageal reflux disease)    H/O hiatal hernia    Headache(784.0)    when b/p is elevated   Hemorrhoids    Hx of AKA (above knee amputation), right (HCC)    Hyperlipidemia    takes Simvastatin daily   Hypertension    takes Amlodipine/HCTZ/Losartan daily   Migraines    Myocardial infarction ([place]) 1990's   NSTEMI (non-ST elevated myocardial infarction) ([place])    Peripheral vascular disease (HCC)    PONV (postoperative nausea and vomiting)    Stroke ([place])    TIA history   Type II diabetes mellitus ([place])    on Lantus   Vertigo    takes Ativert prn    Past Surgical History:  Procedure Laterality Date   ABDOMINAL AORTAGRAM N/A 02/29/2012   Procedure: ABDOMINAL Maxcine Ham;  Surgeon: [name], MD;  Location: [facility] CATH  LAB;  Service: Cardiovascular;  Laterality: N/A;   ABDOMINAL AORTOGRAM W/LOWER EXTREMITY N/A 02/11/2020   Procedure: ABDOMINAL AORTOGRAM W/LOWER EXTREMITY;  Surgeon: [name], MD;  Location: [place] CV LAB;  Service: Cardiovascular;  Laterality: N/A;   ABDOMINAL AORTOGRAM W/LOWER EXTREMITY N/A 03/17/2020   Procedure: ABDOMINAL AORTOGRAM W/LOWER EXTREMITY;  Surgeon: [name], MD;  Location: [place] CV LAB;  Service: Cardiovascular;  Laterality: N/A;   AMPUTATION  05/10/2012   Procedure: AMPUTATION ABOVE KNEE;  Surgeon: [name], MD;  Location: [facility] OR;  Service: Vascular;  Laterality: Right;   open right groin wound noted   aortogram  02/2012   AV FISTULA PLACEMENT Right 03/04/2019   Procedure: ARTERIOVENOUS (AV) FISTULA CREATION;  Surgeon: Rosetta Posner, MD;  Location: [place];  Service: Vascular;  Laterality: Right;   CARDIAC CATHETERIZATION N/A 04/13/2015   Procedure: Right/Left Heart Cath and Coronary Angiography;  Surgeon: [name], MD;  Location: [place] CV LAB;  Service: Cardiovascular;  Laterality: N/A;   CLEFT PALATE REPAIR     several   COLONOSCOPY     DILATION AND CURETTAGE OF UTERUS  FEMORAL-POPLITEAL BYPASS GRAFT  04/05/2012   Procedure: BYPASS GRAFT FEMORAL-POPLITEAL ARTERY;  Surgeon: [name], MD;  Location: [facility] OR;  Service: Vascular;  Laterality: Right;  REVISION   FEMORAL-TIBIAL BYPASS GRAFT  03/29/2012   Procedure: BYPASS GRAFT FEMORAL-TIBIAL ARTERY;  Surgeon: [name], MD;  Location: [facility] OR;  Service: Vascular;  Laterality: Right;  Right femoral to Posterior Tibialis with composite graft of 25mm x 80 cm ringed gortex graft and saphenous vein  ,intraoperative arteriogram   FEMOROPOPLITEAL THROMBECTOMY / EMBOLECTOMY  05/09/2012   IR FLUORO GUIDE CV LINE RIGHT  03/01/2019   IR US GUIDE VASC ACCESS RIGHT  03/01/2019   MULTIPLE TOOTH EXTRACTIONS     MYOMECTOMY     PERIPHERAL VASCULAR INTERVENTION Left 03/17/2020   Procedure: PERIPHERAL  VASCULAR INTERVENTION;  Surgeon: [name], MD;  Location: [place] CV LAB;  Service: Cardiovascular;  Laterality: Left;  popliteal   PR VEIN BYPASS GRAFT,AORTO-FEM-POP  05/27/11   Right SFA-Below knee Pop BP   REVISION OF ARTERIOVENOUS GORETEX GRAFT Right 08/21/2019   Procedure: REVISION OF ARTERIOVENOUS FISTULA RIGHT ARM;  Surgeon: Rosetta Posner, MD;  Location: [facility] OR;  Service: Vascular;  Laterality: Right;   TEE WITHOUT CARDIOVERSION N/A 01/03/2017   Procedure: TRANSESOPHAGEAL ECHOCARDIOGRAM (TEE);  Surgeon: Skeet Latch, MD;  Location: [facility] ENDOSCOPY;  Service: Cardiovascular;  Laterality: N/A;   THORACENTESIS  02/27/2019       TONSILLECTOMY     TUBAL LIGATION  ~ 1990    ROS:   General:  No weight loss, Fever, chills  HEENT: No recent headaches, no nasal bleeding, no visual changes, no sore throat  Neurologic: No dizziness, blackouts, seizures. No recent symptoms of stroke or mini- stroke. No recent episodes of slurred speech, or temporary blindness.  Cardiac: No recent episodes of chest pain/pressure, no shortness of breath at rest.  No shortness of breath with exertion.  Denies history of atrial fibrillation or irregular heartbeat  Vascular: No history of rest pain in feet.  No history of claudication.  positive history of non-healing ulcer, No history of DVT   Pulmonary: No home oxygen, no productive cough, no hemoptysis,  No asthma or wheezing  Musculoskeletal:  [ ]  Arthritis, [ ]  Low back pain,  [ ]  Joint pain  Hematologic:No history of hypercoagulable state.  No history of easy bleeding.  No history of anemia  Gastrointestinal: No hematochezia or melena,  No gastroesophageal reflux, no trouble swallowing  Urinary: [ ]  chronic Kidney disease, [ x] on HD - [ ]  MWF or [ ]  TTHS, [ ]  Burning with urination, [ ]  Frequent urination, [ ]  Difficulty urinating;   Skin: No rashes  Psychological: No history of anxiety,  No history of depression  Social History Social  History   Tobacco Use   Smoking status: Former    Packs/day: 0.25    Years: 40.00    Pack years: 10.00    Types: Cigarettes    Quit date: 02/27/2012    Years since quitting: 9.3   Smokeless tobacco: Never  Vaping Use   Vaping Use: Never used  Substance Use Topics   Alcohol use: No   Drug use: No    Family History Family History  Problem Relation Age of Onset   Cancer Mother        breast   Diabetes Father    Cancer Brother    Heart attack Maternal Grandmother    Heart attack Maternal Grandfather     Allergies  Allergies  Allergen Reactions   Plavix [Clopidogrel Bisulfate] Itching   Codeine Other (See Comments)    "makes me feel strange"   Losartan Other (See Comments)   Tylenol With Codeine #3 [Acetaminophen-Codeine] Other (See Comments)    "doesn't make me feel right"     Current Outpatient Medications  Medication Sig Dispense Refill   acetaminophen (TYLENOL) 500 MG tablet Take 500 mg by mouth every 6 (six) hours as needed for mild pain.     ALPRAZolam (XANAX) 0.25 MG tablet Take 1 tablet (0.25 mg total) by mouth 3 (three) times daily as needed for anxiety. 12 tablet 0   amLODipine (NORVASC) 10 MG tablet Take 10 mg by mouth daily.     aspirin 81 MG chewable tablet Chew 81 mg by mouth daily.     atorvastatin (LIPITOR) 80 MG tablet Take 80 mg by mouth daily.      carvedilol (COREG) 25 MG tablet Take 25 mg by mouth daily.      cholecalciferol (VITAMIN D) 25 MCG (1000 UT) tablet Take 1,000 Units by mouth daily.     docusate sodium (COLACE) 100 MG capsule Take 100 mg by mouth daily as needed for mild constipation.     insulin glargine (LANTUS) 100 UNIT/ML injection Inject 0.2 mLs (20 Units total) into the skin at bedtime. (Patient taking differently: Inject 5 Units into the skin at bedtime.) 10 mL 11   mupirocin ointment (BACTROBAN) 2 % 1 application daily.     nitroGLYCERIN (NITROSTAT) 0.4 MG SL tablet DISSOLVE 1 TABLET BY MOUTH UNDER THE TONGUE EVERY 5 MINUTES FOR 3  DOSES AS NEEDED FOR CHEST PAIN 25 tablet 5   oxyCODONE-acetaminophen (PERCOCET/ROXICET) 5-325 MG tablet Take 1 tablet by mouth every 6 (six) hours as needed for severe pain. 10 tablet 0   RENVELA 800 MG tablet Take 800 mg by mouth 3 (three) times daily with meals.      ticagrelor (BRILINTA) 90 MG TABS tablet Take 1 tablet (90 mg total) by mouth 2 (two) times daily. 60 tablet 3   No current facility-administered medications for this visit.    Physical Examination  Vitals:   07/14/21 0854  BP: (!) 168/79  Pulse: 82  Temp: 98.2 F (36.8 C)  TempSrc: Skin  SpO2: 99%    There is no height or weight on file to calculate BMI.  General:  Alert and oriented, no acute distress HEENT: Normal Neck: No bruit or JVD Pulmonary: Clear to auscultation bilaterally Cardiac: Regular Rate and Rhythm without murmur Abdomen: Soft, non-tender, non-distended, no mass, no scars Skin: No rash   Residual blister left lateral heel   Musculoskeletal: No deformity or edema  Neurologic: Upper and lower extremity motor 5/5 and symmetric  DATA:      ABI Findings:  +--------+------------------+-----+--------+---------+  Right   Rt Pressure (mmHg)IndexWaveformComment    +--------+------------------+-----+--------+---------+  Brachial                               HD access  +--------+------------------+-----+--------+---------+   +---------+------------------+-----+----------+-------+  Left     Lt Pressure (mmHg)IndexWaveform  Comment  +---------+------------------+-----+----------+-------+  Brachial 181                                       +---------+------------------+-----+----------+-------+  PTA  [place]   monophasic         +---------+------------------+-----+----------+-------+  DP                         [place]   monophasic         +---------+------------------+-----+----------+-------+  Great Toe43                0.24                     +---------+------------------+-----+----------+-------+   +-------+-----------+-----------+------------+------------+  ABI/TBIToday's ABIToday's TBIPrevious ABIPrevious TBI  +-------+-----------+-----------+------------+------------+  Right  AKA        AKA        AKA         AKA           +-------+-----------+-----------+------------+------------+  Left   [place]         0.24       [place]          0.54          +-------+-----------+-----------+------------+------------+    Left TBIs appear decreased compared to prior study on 04/21/2020.     Summary:  Left: Resting left ankle-brachial index indicates noncompressible left  lower extremity arteries. The left toe-brachial index is abnormal.       +-----------+--------+-----+---------------+----------+--------------------  ----+  LEFT       PSV cm/sRatioStenosis       Waveform  Comments                    +-----------+--------+-----+---------------+----------+--------------------  ----+  CFA Prox   131                         triphasic                             +-----------+--------+-----+---------------+----------+--------------------  ----+  CFA Distal 75                          triphasic                             +-----------+--------+-----+---------------+----------+--------------------  ----+  DFA        78                          biphasic                              +-----------+--------+-----+---------------+----------+--------------------  ----+  SFA Prox   45                          biphasic                              +-----------+--------+-----+---------------+----------+--------------------  ----+  SFA Mid    220          50-74% stenosisbiphasic  collateral  visualized     +-----------+--------+-----+---------------+----------+--------------------  ----+  SFA Distal              occluded                 occluded with  distal  popliteal  reconstitution  +-----------+--------+-----+---------------+----------+--------------------  ----+  POP Prox                occluded                                             +-----------+--------+-----+---------------+----------+--------------------  ----+  POP Distal 29                          monophasic                            +-----------+--------+-----+---------------+----------+--------------------  ----+  ATA Distal 21                          monophasic                            +-----------+--------+-----+---------------+----------+--------------------  ----+  PTA Distal 56                          monophasic                            +-----------+--------+-----+---------------+----------+--------------------  ----+  PERO Distal                                      not visualized              +-----------+--------+-----+---------------+----------+--------------------  ----+      Left Stent(s):  +---------------+--------+--------+----------+----------------------+  SFA/popliteal  PSV cm/sStenosisWaveform  Comments                +---------------+--------+--------+----------+----------------------+  Prox to Stent  83                                                +---------------+--------+--------+----------+----------------------+  Proximal Stent 196                                               +---------------+--------+--------+----------+----------------------+  Mid Stent              occluded                                  +---------------+--------+--------+----------+----------------------+  Distal Stent           occluded                                  +---------------+--------+--------+----------+----------------------+  Distal to Stent29              monophasicreconstitution of flow   +---------------+--------+--------+----------+----------------------+     Summary:  Left: Total occlusion noted in the superficial femoral artery with  reconstitution of flow distally in the popliteal artery. Collateral flow  noted. SFA/popliteal  stent appears occluded.   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  The wound was a GT toe wound and lateral 5th MT wound that have healed.  Today on exam she has a lateral heel wound/old blister for the past few weeks.  She has used antibiotic ointment and states it is getting better.    Her stent are occluded with collateral flow beyond the stent.  She is not interested in intervention at this time.  She wants to wait and see if the blister area heals with some more time.      PLAN: She will f/u in 3-4 weeks for a wound check.  The stents are occluded with collateral flow and calcified vessels.  If the wound does not continue to heal she will need LE vein mapping and a f/u with Dr. [name].     Roxy Horseman PA-C [facility] [place] Office: [phone]  MD in clinic [place]

## 2021-07-16 DIAGNOSIS — D631 Anemia in chronic kidney disease: Secondary | ICD-10-CM | POA: Diagnosis not present

## 2021-07-16 DIAGNOSIS — N186 End stage renal disease: Secondary | ICD-10-CM | POA: Diagnosis not present

## 2021-07-16 DIAGNOSIS — E1121 Type 2 diabetes mellitus with diabetic nephropathy: Secondary | ICD-10-CM | POA: Diagnosis not present

## 2021-07-16 DIAGNOSIS — E785 Hyperlipidemia, unspecified: Secondary | ICD-10-CM | POA: Diagnosis not present

## 2021-07-16 DIAGNOSIS — N2581 Secondary hyperparathyroidism of renal origin: Secondary | ICD-10-CM | POA: Diagnosis not present

## 2021-07-16 DIAGNOSIS — D509 Iron deficiency anemia, unspecified: Secondary | ICD-10-CM | POA: Diagnosis not present

## 2021-07-19 DIAGNOSIS — E785 Hyperlipidemia, unspecified: Secondary | ICD-10-CM | POA: Diagnosis not present

## 2021-07-19 DIAGNOSIS — D631 Anemia in chronic kidney disease: Secondary | ICD-10-CM | POA: Diagnosis not present

## 2021-07-19 DIAGNOSIS — N186 End stage renal disease: Secondary | ICD-10-CM | POA: Diagnosis not present

## 2021-07-19 DIAGNOSIS — E1121 Type 2 diabetes mellitus with diabetic nephropathy: Secondary | ICD-10-CM | POA: Diagnosis not present

## 2021-07-19 DIAGNOSIS — N2581 Secondary hyperparathyroidism of renal origin: Secondary | ICD-10-CM | POA: Diagnosis not present

## 2021-07-19 DIAGNOSIS — D509 Iron deficiency anemia, unspecified: Secondary | ICD-10-CM | POA: Diagnosis not present

## 2021-07-19 DIAGNOSIS — Z992 Dependence on renal dialysis: Secondary | ICD-10-CM | POA: Diagnosis not present

## 2021-07-21 DIAGNOSIS — N2581 Secondary hyperparathyroidism of renal origin: Secondary | ICD-10-CM | POA: Diagnosis not present

## 2021-07-21 DIAGNOSIS — N186 End stage renal disease: Secondary | ICD-10-CM | POA: Diagnosis not present

## 2021-07-21 DIAGNOSIS — D509 Iron deficiency anemia, unspecified: Secondary | ICD-10-CM | POA: Diagnosis not present

## 2021-07-21 DIAGNOSIS — D631 Anemia in chronic kidney disease: Secondary | ICD-10-CM | POA: Diagnosis not present

## 2021-07-21 DIAGNOSIS — E1121 Type 2 diabetes mellitus with diabetic nephropathy: Secondary | ICD-10-CM | POA: Diagnosis not present

## 2021-07-23 DIAGNOSIS — N2581 Secondary hyperparathyroidism of renal origin: Secondary | ICD-10-CM | POA: Diagnosis not present

## 2021-07-23 DIAGNOSIS — D509 Iron deficiency anemia, unspecified: Secondary | ICD-10-CM | POA: Diagnosis not present

## 2021-07-23 DIAGNOSIS — D631 Anemia in chronic kidney disease: Secondary | ICD-10-CM | POA: Diagnosis not present

## 2021-07-23 DIAGNOSIS — N186 End stage renal disease: Secondary | ICD-10-CM | POA: Diagnosis not present

## 2021-07-23 DIAGNOSIS — E1121 Type 2 diabetes mellitus with diabetic nephropathy: Secondary | ICD-10-CM | POA: Diagnosis not present

## 2021-07-26 DIAGNOSIS — E1121 Type 2 diabetes mellitus with diabetic nephropathy: Secondary | ICD-10-CM | POA: Diagnosis not present

## 2021-07-26 DIAGNOSIS — N186 End stage renal disease: Secondary | ICD-10-CM | POA: Diagnosis not present

## 2021-07-26 DIAGNOSIS — D631 Anemia in chronic kidney disease: Secondary | ICD-10-CM | POA: Diagnosis not present

## 2021-07-26 DIAGNOSIS — N2581 Secondary hyperparathyroidism of renal origin: Secondary | ICD-10-CM | POA: Diagnosis not present

## 2021-07-26 DIAGNOSIS — D509 Iron deficiency anemia, unspecified: Secondary | ICD-10-CM | POA: Diagnosis not present

## 2021-07-28 DIAGNOSIS — D509 Iron deficiency anemia, unspecified: Secondary | ICD-10-CM | POA: Diagnosis not present

## 2021-07-28 DIAGNOSIS — N2581 Secondary hyperparathyroidism of renal origin: Secondary | ICD-10-CM | POA: Diagnosis not present

## 2021-07-28 DIAGNOSIS — D631 Anemia in chronic kidney disease: Secondary | ICD-10-CM | POA: Diagnosis not present

## 2021-07-28 DIAGNOSIS — E1121 Type 2 diabetes mellitus with diabetic nephropathy: Secondary | ICD-10-CM | POA: Diagnosis not present

## 2021-07-28 DIAGNOSIS — N186 End stage renal disease: Secondary | ICD-10-CM | POA: Diagnosis not present

## 2021-07-30 DIAGNOSIS — N186 End stage renal disease: Secondary | ICD-10-CM | POA: Diagnosis not present

## 2021-07-30 DIAGNOSIS — D631 Anemia in chronic kidney disease: Secondary | ICD-10-CM | POA: Diagnosis not present

## 2021-07-30 DIAGNOSIS — D509 Iron deficiency anemia, unspecified: Secondary | ICD-10-CM | POA: Diagnosis not present

## 2021-07-30 DIAGNOSIS — N2581 Secondary hyperparathyroidism of renal origin: Secondary | ICD-10-CM | POA: Diagnosis not present

## 2021-07-30 DIAGNOSIS — E1121 Type 2 diabetes mellitus with diabetic nephropathy: Secondary | ICD-10-CM | POA: Diagnosis not present

## 2021-08-02 DIAGNOSIS — N186 End stage renal disease: Secondary | ICD-10-CM | POA: Diagnosis not present

## 2021-08-02 DIAGNOSIS — E1121 Type 2 diabetes mellitus with diabetic nephropathy: Secondary | ICD-10-CM | POA: Diagnosis not present

## 2021-08-02 DIAGNOSIS — N2581 Secondary hyperparathyroidism of renal origin: Secondary | ICD-10-CM | POA: Diagnosis not present

## 2021-08-02 DIAGNOSIS — D631 Anemia in chronic kidney disease: Secondary | ICD-10-CM | POA: Diagnosis not present

## 2021-08-02 DIAGNOSIS — D509 Iron deficiency anemia, unspecified: Secondary | ICD-10-CM | POA: Diagnosis not present

## 2021-08-04 DIAGNOSIS — D631 Anemia in chronic kidney disease: Secondary | ICD-10-CM | POA: Diagnosis not present

## 2021-08-04 DIAGNOSIS — N186 End stage renal disease: Secondary | ICD-10-CM | POA: Diagnosis not present

## 2021-08-04 DIAGNOSIS — N2581 Secondary hyperparathyroidism of renal origin: Secondary | ICD-10-CM | POA: Diagnosis not present

## 2021-08-04 DIAGNOSIS — E1121 Type 2 diabetes mellitus with diabetic nephropathy: Secondary | ICD-10-CM | POA: Diagnosis not present

## 2021-08-04 DIAGNOSIS — D509 Iron deficiency anemia, unspecified: Secondary | ICD-10-CM | POA: Diagnosis not present

## 2021-08-06 DIAGNOSIS — D509 Iron deficiency anemia, unspecified: Secondary | ICD-10-CM | POA: Diagnosis not present

## 2021-08-06 DIAGNOSIS — E1121 Type 2 diabetes mellitus with diabetic nephropathy: Secondary | ICD-10-CM | POA: Diagnosis not present

## 2021-08-06 DIAGNOSIS — N2581 Secondary hyperparathyroidism of renal origin: Secondary | ICD-10-CM | POA: Diagnosis not present

## 2021-08-06 DIAGNOSIS — D631 Anemia in chronic kidney disease: Secondary | ICD-10-CM | POA: Diagnosis not present

## 2021-08-06 DIAGNOSIS — N186 End stage renal disease: Secondary | ICD-10-CM | POA: Diagnosis not present

## 2021-08-08 DIAGNOSIS — D631 Anemia in chronic kidney disease: Secondary | ICD-10-CM | POA: Diagnosis not present

## 2021-08-08 DIAGNOSIS — E1121 Type 2 diabetes mellitus with diabetic nephropathy: Secondary | ICD-10-CM | POA: Diagnosis not present

## 2021-08-08 DIAGNOSIS — N2581 Secondary hyperparathyroidism of renal origin: Secondary | ICD-10-CM | POA: Diagnosis not present

## 2021-08-08 DIAGNOSIS — D509 Iron deficiency anemia, unspecified: Secondary | ICD-10-CM | POA: Diagnosis not present

## 2021-08-08 DIAGNOSIS — N186 End stage renal disease: Secondary | ICD-10-CM | POA: Diagnosis not present

## 2021-08-10 ENCOUNTER — Other Ambulatory Visit: Payer: Self-pay

## 2021-08-10 ENCOUNTER — Ambulatory Visit (INDEPENDENT_AMBULATORY_CARE_PROVIDER_SITE_OTHER): Payer: Medicare Other | Admitting: Physician Assistant

## 2021-08-10 VITALS — BP 186/89 | HR 103 | Temp 98.6°F | Resp 20 | Ht 64.0 in

## 2021-08-10 DIAGNOSIS — I70229 Atherosclerosis of native arteries of extremities with rest pain, unspecified extremity: Secondary | ICD-10-CM

## 2021-08-10 DIAGNOSIS — E1121 Type 2 diabetes mellitus with diabetic nephropathy: Secondary | ICD-10-CM | POA: Diagnosis not present

## 2021-08-10 DIAGNOSIS — D509 Iron deficiency anemia, unspecified: Secondary | ICD-10-CM | POA: Diagnosis not present

## 2021-08-10 DIAGNOSIS — I2510 Atherosclerotic heart disease of native coronary artery without angina pectoris: Secondary | ICD-10-CM | POA: Diagnosis not present

## 2021-08-10 DIAGNOSIS — D631 Anemia in chronic kidney disease: Secondary | ICD-10-CM | POA: Diagnosis not present

## 2021-08-10 DIAGNOSIS — N186 End stage renal disease: Secondary | ICD-10-CM | POA: Diagnosis not present

## 2021-08-10 DIAGNOSIS — N2581 Secondary hyperparathyroidism of renal origin: Secondary | ICD-10-CM | POA: Diagnosis not present

## 2021-08-10 NOTE — Progress Notes (Signed)
POST OPERATIVE OFFICE NOTE    CC:  F/u left foot wound check  HPI:  This is a 70 y.o. female who was last seen on 07/14/21.  She has known PAD with a left lateral dry heel wound that is not open or drainage.  No erythema or pain was reported on her last visit.  Today she reports rest pain in the toes and has a new wound on the 4th toe.  She denise fever and chills.    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  Left LE s/p left SFA and popliteal artery stent placement for non healing foot wound.  On her last visit 04/21/20 the left foot wound was healed.    Pt has ESRD and dialyzes in [facility] on M/W/F.     The pt is on a statin for cholesterol management.    The pt is on an aspirin.    Other AC:  Brilinta The pt is on BB for hypertension.  The pt does have diabetes. Tobacco hx:  Former-quit 2013   Allergies  Allergen Reactions   Plavix [Clopidogrel Bisulfate] Itching   Codeine Other (See Comments)    "makes me feel strange"   Losartan Other (See Comments)   Tylenol With Codeine #3 [Acetaminophen-Codeine] Other (See Comments)    "doesn't make me feel right"    Current Outpatient Medications  Medication Sig Dispense Refill   acetaminophen (TYLENOL) 500 MG tablet Take 500 mg by mouth every 6 (six) hours as needed for mild pain.     ALPRAZolam (XANAX) 0.25 MG tablet Take 1 tablet (0.25 mg total) by mouth 3 (three) times daily as needed for anxiety. 12 tablet 0   amLODipine (NORVASC) 10 MG tablet Take 10 mg by mouth daily.     aspirin 81 MG chewable tablet Chew 81 mg by mouth daily.     atorvastatin (LIPITOR) 80 MG tablet Take 80 mg by mouth daily.      carvedilol (COREG) 25 MG tablet Take 25 mg by mouth daily.      cholecalciferol (VITAMIN D) 25 MCG (1000 UT) tablet Take 1,000 Units by mouth daily.     docusate sodium (COLACE) 100 MG capsule Take 100 mg by mouth daily as needed for mild constipation.     insulin glargine (LANTUS) 100 UNIT/ML injection  Inject 0.2 mLs (20 Units total) into the skin at bedtime. (Patient taking differently: Inject 5 Units into the skin at bedtime.) 10 mL 11   mupirocin ointment (BACTROBAN) 2 % 1 application daily.     nitroGLYCERIN (NITROSTAT) 0.4 MG SL tablet DISSOLVE 1 TABLET BY MOUTH UNDER THE TONGUE EVERY 5 MINUTES FOR 3 DOSES AS NEEDED FOR CHEST PAIN 25 tablet 5   oxyCODONE-acetaminophen (PERCOCET/ROXICET) 5-325 MG tablet Take 1 tablet by mouth every 6 (six) hours as needed for severe pain. 10 tablet 0   RENVELA 800 MG tablet Take 800 mg by mouth 3 (three) times daily with meals.      ticagrelor (BRILINTA) 90 MG TABS tablet Take 1 tablet (90 mg total) by mouth 2 (two) times daily. 60 tablet 3   No current facility-administered medications for this visit.     ROS:  See HPI  Physical Exam:        Extremities:  toes cool, minimal motor  Neuro: decreased sensation  Heart:  RRR   Assessment/Plan:  This is a 70 y.o. female who is s/p:right above knee amputation 05/10/2012 after attempted limb salvage.  Left  LE s/p left SFA and popliteal artery stent placement for non healing foot wound.  On her last visit 04/21/20 the left foot wound was healed.    She now has rest pain and new non healing wounds  I have scheduled her for angiogram with possible intervention left LE.  Please see fully H & P from 07/14/21.  She is on  Brilinta.     Roxy Horseman PA-C [facility] [phone]   Clinic MD:  Stanford Breed

## 2021-08-13 DIAGNOSIS — N2581 Secondary hyperparathyroidism of renal origin: Secondary | ICD-10-CM | POA: Diagnosis not present

## 2021-08-13 DIAGNOSIS — E1121 Type 2 diabetes mellitus with diabetic nephropathy: Secondary | ICD-10-CM | POA: Diagnosis not present

## 2021-08-13 DIAGNOSIS — N186 End stage renal disease: Secondary | ICD-10-CM | POA: Diagnosis not present

## 2021-08-13 DIAGNOSIS — D631 Anemia in chronic kidney disease: Secondary | ICD-10-CM | POA: Diagnosis not present

## 2021-08-13 DIAGNOSIS — D509 Iron deficiency anemia, unspecified: Secondary | ICD-10-CM | POA: Diagnosis not present

## 2021-08-16 ENCOUNTER — Observation Stay (HOSPITAL_COMMUNITY): Payer: Medicare Other

## 2021-08-16 ENCOUNTER — Emergency Department (HOSPITAL_COMMUNITY): Payer: Medicare Other

## 2021-08-16 ENCOUNTER — Inpatient Hospital Stay (HOSPITAL_COMMUNITY)
Admission: EM | Admit: 2021-08-16 | Discharge: 2021-08-19 | DRG: 280 | Disposition: A | Discharge status: Home / Self Care | Payer: Medicare Other | Attending: Internal Medicine | Admitting: Internal Medicine

## 2021-08-16 DIAGNOSIS — Z992 Dependence on renal dialysis: Secondary | ICD-10-CM | POA: Diagnosis not present

## 2021-08-16 DIAGNOSIS — I1311 Hypertensive heart and chronic kidney disease without heart failure, with stage 5 chronic kidney disease, or end stage renal disease: Secondary | ICD-10-CM | POA: Diagnosis not present

## 2021-08-16 DIAGNOSIS — R079 Chest pain, unspecified: Secondary | ICD-10-CM

## 2021-08-16 DIAGNOSIS — N189 Chronic kidney disease, unspecified: Secondary | ICD-10-CM | POA: Diagnosis present

## 2021-08-16 DIAGNOSIS — Z7902 Long term (current) use of antithrombotics/antiplatelets: Secondary | ICD-10-CM

## 2021-08-16 DIAGNOSIS — E559 Vitamin D deficiency, unspecified: Secondary | ICD-10-CM | POA: Diagnosis present

## 2021-08-16 DIAGNOSIS — Z888 Allergy status to other drugs, medicaments and biological substances status: Secondary | ICD-10-CM

## 2021-08-16 DIAGNOSIS — M898X9 Other specified disorders of bone, unspecified site: Secondary | ICD-10-CM | POA: Diagnosis present

## 2021-08-16 DIAGNOSIS — I252 Old myocardial infarction: Secondary | ICD-10-CM

## 2021-08-16 DIAGNOSIS — D631 Anemia in chronic kidney disease: Secondary | ICD-10-CM | POA: Diagnosis not present

## 2021-08-16 DIAGNOSIS — Z794 Long term (current) use of insulin: Secondary | ICD-10-CM

## 2021-08-16 DIAGNOSIS — I132 Hypertensive heart and chronic kidney disease with heart failure and with stage 5 chronic kidney disease, or end stage renal disease: Secondary | ICD-10-CM | POA: Diagnosis not present

## 2021-08-16 DIAGNOSIS — I10 Essential (primary) hypertension: Secondary | ICD-10-CM | POA: Diagnosis present

## 2021-08-16 DIAGNOSIS — R0602 Shortness of breath: Secondary | ICD-10-CM | POA: Diagnosis not present

## 2021-08-16 DIAGNOSIS — Z89611 Acquired absence of right leg above knee: Secondary | ICD-10-CM

## 2021-08-16 DIAGNOSIS — U071 COVID-19: Secondary | ICD-10-CM | POA: Diagnosis not present

## 2021-08-16 DIAGNOSIS — I214 Non-ST elevation (NSTEMI) myocardial infarction: Principal | ICD-10-CM | POA: Diagnosis present

## 2021-08-16 DIAGNOSIS — R0789 Other chest pain: Secondary | ICD-10-CM | POA: Diagnosis not present

## 2021-08-16 DIAGNOSIS — I5042 Chronic combined systolic (congestive) and diastolic (congestive) heart failure: Secondary | ICD-10-CM | POA: Diagnosis present

## 2021-08-16 DIAGNOSIS — F419 Anxiety disorder, unspecified: Secondary | ICD-10-CM | POA: Diagnosis present

## 2021-08-16 DIAGNOSIS — I25110 Atherosclerotic heart disease of native coronary artery with unstable angina pectoris: Secondary | ICD-10-CM | POA: Diagnosis present

## 2021-08-16 DIAGNOSIS — I2510 Atherosclerotic heart disease of native coronary artery without angina pectoris: Secondary | ICD-10-CM | POA: Diagnosis present

## 2021-08-16 DIAGNOSIS — Z79899 Other long term (current) drug therapy: Secondary | ICD-10-CM

## 2021-08-16 DIAGNOSIS — R000 Tachycardia, unspecified: Secondary | ICD-10-CM | POA: Diagnosis not present

## 2021-08-16 DIAGNOSIS — N250 Renal osteodystrophy: Secondary | ICD-10-CM | POA: Diagnosis not present

## 2021-08-16 DIAGNOSIS — Z7982 Long term (current) use of aspirin: Secondary | ICD-10-CM

## 2021-08-16 DIAGNOSIS — E785 Hyperlipidemia, unspecified: Secondary | ICD-10-CM | POA: Diagnosis present

## 2021-08-16 DIAGNOSIS — Z885 Allergy status to narcotic agent status: Secondary | ICD-10-CM

## 2021-08-16 DIAGNOSIS — I499 Cardiac arrhythmia, unspecified: Secondary | ICD-10-CM | POA: Diagnosis not present

## 2021-08-16 DIAGNOSIS — M1990 Unspecified osteoarthritis, unspecified site: Secondary | ICD-10-CM | POA: Diagnosis present

## 2021-08-16 DIAGNOSIS — R778 Other specified abnormalities of plasma proteins: Secondary | ICD-10-CM | POA: Diagnosis not present

## 2021-08-16 DIAGNOSIS — K219 Gastro-esophageal reflux disease without esophagitis: Secondary | ICD-10-CM | POA: Diagnosis present

## 2021-08-16 DIAGNOSIS — R9431 Abnormal electrocardiogram [ECG] [EKG]: Secondary | ICD-10-CM | POA: Diagnosis not present

## 2021-08-16 DIAGNOSIS — N186 End stage renal disease: Secondary | ICD-10-CM | POA: Diagnosis not present

## 2021-08-16 DIAGNOSIS — Z87891 Personal history of nicotine dependence: Secondary | ICD-10-CM

## 2021-08-16 DIAGNOSIS — E119 Type 2 diabetes mellitus without complications: Secondary | ICD-10-CM

## 2021-08-16 DIAGNOSIS — Z8673 Personal history of transient ischemic attack (TIA), and cerebral infarction without residual deficits: Secondary | ICD-10-CM

## 2021-08-16 DIAGNOSIS — Z87440 Personal history of urinary (tract) infections: Secondary | ICD-10-CM

## 2021-08-16 DIAGNOSIS — Z95828 Presence of other vascular implants and grafts: Secondary | ICD-10-CM

## 2021-08-16 DIAGNOSIS — E538 Deficiency of other specified B group vitamins: Secondary | ICD-10-CM | POA: Diagnosis present

## 2021-08-16 DIAGNOSIS — E1122 Type 2 diabetes mellitus with diabetic chronic kidney disease: Secondary | ICD-10-CM | POA: Diagnosis present

## 2021-08-16 DIAGNOSIS — Z8249 Family history of ischemic heart disease and other diseases of the circulatory system: Secondary | ICD-10-CM

## 2021-08-16 DIAGNOSIS — E1151 Type 2 diabetes mellitus with diabetic peripheral angiopathy without gangrene: Secondary | ICD-10-CM | POA: Diagnosis present

## 2021-08-16 LAB — COMPREHENSIVE METABOLIC PANEL
ALT: 14 U/L (ref 0–44)
AST: 21 U/L (ref 15–41)
Albumin: 2.9 g/dL — ABNORMAL LOW (ref 3.5–5.0)
Alkaline Phosphatase: 72 U/L (ref 38–126)
Anion gap: 8 (ref 5–15)
BUN: 50 mg/dL — ABNORMAL HIGH (ref 8–23)
CO2: 25 mmol/L (ref 22–32)
Calcium: 8.6 mg/dL — ABNORMAL LOW (ref 8.9–10.3)
Chloride: 104 mmol/L (ref 98–111)
Creatinine, Ser: 7.74 mg/dL — ABNORMAL HIGH (ref 0.44–1.00)
GFR, Estimated: 5 mL/min — ABNORMAL LOW (ref >60)
Glucose, Bld: 146 mg/dL — ABNORMAL HIGH (ref 70–99)
Potassium: 4.7 mmol/L (ref 3.5–5.1)
Sodium: 137 mmol/L (ref 135–145)
Total Bilirubin: 0.6 mg/dL (ref 0.3–1.2)
Total Protein: 6.1 g/dL — ABNORMAL LOW (ref 6.5–8.1)

## 2021-08-16 LAB — CBG MONITORING, ED
Glucose-Capillary: 149 mg/dL — ABNORMAL HIGH (ref 70–99)
Glucose-Capillary: 73 mg/dL (ref 70–99)
Glucose-Capillary: 120 mg/dL — ABNORMAL HIGH (ref 70–99)

## 2021-08-16 LAB — CBC WITH DIFFERENTIAL/PLATELET
Abs Immature Granulocytes: 0.03 10*3/uL (ref 0.00–0.07)
Basophils Absolute: 0 10*3/uL (ref 0.0–0.1)
Basophils Relative: 1 %
Eosinophils Absolute: 0 10*3/uL (ref 0.0–0.5)
Eosinophils Relative: 0 %
HCT: 34.7 % — ABNORMAL LOW (ref 36.0–46.0)
Hemoglobin: 10.5 g/dL — ABNORMAL LOW (ref 12.0–15.0)
Immature Granulocytes: 1 %
Lymphocytes Relative: 20 %
Lymphs Abs: 0.9 10*3/uL (ref 0.7–4.0)
MCH: 30.3 pg (ref 26.0–34.0)
MCHC: 30.3 g/dL (ref 30.0–36.0)
MCV: 100 fL (ref 80.0–100.0)
Monocytes Absolute: 0.8 10*3/uL (ref 0.1–1.0)
Monocytes Relative: 16 %
Neutro Abs: 2.9 10*3/uL (ref 1.7–7.7)
Neutrophils Relative %: 62 %
Platelets: 171 10*3/uL (ref 150–400)
RBC: 3.47 MIL/uL — ABNORMAL LOW (ref 3.87–5.11)
RDW: 14.2 % (ref 11.5–15.5)
WBC: 4.6 10*3/uL (ref 4.0–10.5)
nRBC: 0 % (ref 0.0–0.2)

## 2021-08-16 LAB — RESP PANEL BY RT-PCR (FLU A&B, COVID) ARPGX2
Influenza A by PCR: NEGATIVE
Influenza B by PCR: NEGATIVE
SARS Coronavirus 2 by RT PCR: POSITIVE — AB

## 2021-08-16 LAB — TROPONIN I (HIGH SENSITIVITY)
Troponin I (High Sensitivity): 424 ng/L (ref <18)
Troponin I (High Sensitivity): 381 ng/L (ref <18)

## 2021-08-16 LAB — MAGNESIUM: Magnesium: 2 mg/dL (ref 1.7–2.4)

## 2021-08-16 LAB — PROTIME-INR
INR: 1 (ref 0.8–1.2)
Prothrombin Time: 13 seconds (ref 11.4–15.2)

## 2021-08-16 LAB — BRAIN NATRIURETIC PEPTIDE: B Natriuretic Peptide: 517.6 pg/mL — ABNORMAL HIGH (ref 0.0–100.0)

## 2021-08-16 MED ORDER — ONDANSETRON HCL 4 MG/2ML IJ SOLN: 4.0000 mg | Freq: Four times a day (QID) | INTRAMUSCULAR | Status: DC | PRN

## 2021-08-16 MED ORDER — ALTEPLASE 2 MG IJ SOLR
2.0000 mg | Freq: Once | INTRAMUSCULAR | Status: DC | PRN
  Filled 2021-08-16: qty 2

## 2021-08-16 MED ORDER — INSULIN ASPART 100 UNIT/ML IJ SOLN
0.0000 [IU] | Freq: Three times a day (TID) | INTRAMUSCULAR | Status: DC
  Administered 2021-08-18: 1 [IU] via SUBCUTANEOUS

## 2021-08-16 MED ORDER — OXYCODONE-ACETAMINOPHEN 5-325 MG PO TABS: 1.0000 | ORAL_TABLET | Freq: Four times a day (QID) | ORAL | Status: DC | PRN

## 2021-08-16 MED ORDER — ZINC SULFATE 220 (50 ZN) MG PO CAPS
220.0000 mg | ORAL_CAPSULE | Freq: Every day | ORAL | Status: DC
  Administered 2021-08-16 – 2021-08-19 (×3): 220 mg via ORAL
  Filled 2021-08-16 (×3): qty 1

## 2021-08-16 MED ORDER — ALPRAZOLAM 0.5 MG PO TABS
0.2500 mg | ORAL_TABLET | Freq: Three times a day (TID) | ORAL | Status: DC | PRN
  Administered 2021-08-18: 0.25 mg via ORAL
  Filled 2021-08-16: qty 1

## 2021-08-16 MED ORDER — SODIUM CHLORIDE 0.9% FLUSH
3.0000 mL | Freq: Two times a day (BID) | INTRAVENOUS | Status: DC
  Administered 2021-08-17 – 2021-08-19 (×3): 3 mL via INTRAVENOUS

## 2021-08-16 MED ORDER — NITROGLYCERIN 0.4 MG SL SUBL: 0.4000 mg | SUBLINGUAL_TABLET | SUBLINGUAL | Status: DC | PRN

## 2021-08-16 MED ORDER — DOCUSATE SODIUM 100 MG PO CAPS: 100.0000 mg | ORAL_CAPSULE | Freq: Every day | ORAL | Status: DC | PRN

## 2021-08-16 MED ORDER — HEPARIN BOLUS VIA INFUSION
4000.0000 [IU] | Freq: Once | INTRAVENOUS | Status: AC
  Administered 2021-08-16: 15:00:00 4000 [IU] via INTRAVENOUS
  Filled 2021-08-16: qty 4000

## 2021-08-16 MED ORDER — SODIUM CHLORIDE 0.9 % IV BOLUS
500.0000 mL | Freq: Once | INTRAVENOUS | Status: AC
  Administered 2021-08-16: 10:00:00 500 mL via INTRAVENOUS

## 2021-08-16 MED ORDER — ASPIRIN 81 MG PO CHEW
81.0000 mg | CHEWABLE_TABLET | Freq: Every day | ORAL | Status: DC
  Administered 2021-08-18 – 2021-08-19 (×2): 81 mg via ORAL
  Filled 2021-08-16 (×2): qty 1

## 2021-08-16 MED ORDER — VITAMIN D 25 MCG (1000 UNIT) PO TABS
1000.0000 [IU] | ORAL_TABLET | Freq: Every day | ORAL | Status: DC
  Administered 2021-08-16 – 2021-08-19 (×3): 1000 [IU] via ORAL
  Filled 2021-08-16 (×4): qty 1

## 2021-08-16 MED ORDER — LIDOCAINE-PRILOCAINE 2.5-2.5 % EX CREA: 1.0000 "application " | TOPICAL_CREAM | CUTANEOUS | Status: DC | PRN

## 2021-08-16 MED ORDER — AMLODIPINE BESYLATE 5 MG PO TABS
10.0000 mg | ORAL_TABLET | Freq: Every day | ORAL | Status: DC
  Administered 2021-08-16: 10 mg via ORAL
  Filled 2021-08-16: qty 2

## 2021-08-16 MED ORDER — HEPARIN (PORCINE) 25000 UT/250ML-% IV SOLN
800.0000 [IU]/h | INTRAVENOUS | Status: DC
  Administered 2021-08-16: 15:00:00 850 [IU]/h via INTRAVENOUS
  Filled 2021-08-16 (×2): qty 250

## 2021-08-16 MED ORDER — SODIUM CHLORIDE 0.9% FLUSH: 3.0000 mL | INTRAVENOUS | Status: DC | PRN

## 2021-08-16 MED ORDER — MOLNUPIRAVIR EUA 200MG CAPSULE
4.0000 | ORAL_CAPSULE | Freq: Two times a day (BID) | ORAL | Status: DC
  Administered 2021-08-16 – 2021-08-19 (×5): 800 mg via ORAL
  Filled 2021-08-16: qty 4

## 2021-08-16 MED ORDER — LIDOCAINE HCL (PF) 1 % IJ SOLN: 5.0000 mL | INTRAMUSCULAR | Status: DC | PRN

## 2021-08-16 MED ORDER — SODIUM CHLORIDE 0.9 % IV SOLN: 100.0000 mL | INTRAVENOUS | Status: DC | PRN

## 2021-08-16 MED ORDER — ASPIRIN 81 MG PO CHEW: 324.0000 mg | CHEWABLE_TABLET | Freq: Once | ORAL | Status: DC

## 2021-08-16 MED ORDER — ACETAMINOPHEN 500 MG PO TABS
500.0000 mg | ORAL_TABLET | Freq: Every day | ORAL | Status: DC
  Administered 2021-08-16 – 2021-08-18 (×3): 500 mg via ORAL
  Filled 2021-08-16 (×3): qty 1

## 2021-08-16 MED ORDER — ATORVASTATIN CALCIUM 80 MG PO TABS
80.0000 mg | ORAL_TABLET | Freq: Every day | ORAL | Status: DC
  Administered 2021-08-16 – 2021-08-19 (×3): 80 mg via ORAL
  Filled 2021-08-16 (×3): qty 1

## 2021-08-16 MED ORDER — PENTAFLUOROPROP-TETRAFLUOROETH EX AERO: 1.0000 "application " | INHALATION_SPRAY | CUTANEOUS | Status: DC | PRN

## 2021-08-16 MED ORDER — TICAGRELOR 90 MG PO TABS
90.0000 mg | ORAL_TABLET | Freq: Two times a day (BID) | ORAL | Status: DC
  Administered 2021-08-17 – 2021-08-19 (×4): 90 mg via ORAL
  Filled 2021-08-16 (×4): qty 1

## 2021-08-16 MED ORDER — ACETAMINOPHEN 325 MG PO TABS
650.0000 mg | ORAL_TABLET | Freq: Four times a day (QID) | ORAL | Status: DC | PRN
  Filled 2021-08-16: qty 2

## 2021-08-16 MED ORDER — ACETAMINOPHEN 650 MG RE SUPP: 650.0000 mg | Freq: Four times a day (QID) | RECTAL | Status: DC | PRN

## 2021-08-16 MED ORDER — CHLORHEXIDINE GLUCONATE CLOTH 2 % EX PADS
6.0000 | MEDICATED_PAD | Freq: Every day | CUTANEOUS | Status: DC
  Administered 2021-08-18: 6 via TOPICAL

## 2021-08-16 MED ORDER — SEVELAMER CARBONATE 800 MG PO TABS
800.0000 mg | ORAL_TABLET | Freq: Three times a day (TID) | ORAL | Status: DC
  Administered 2021-08-17 – 2021-08-19 (×6): 800 mg via ORAL
  Filled 2021-08-16 (×6): qty 1

## 2021-08-16 MED ORDER — GUAIFENESIN-DM 100-10 MG/5ML PO SYRP
10.0000 mL | ORAL_SOLUTION | ORAL | Status: DC | PRN
  Administered 2021-08-17: 10 mL via ORAL
  Filled 2021-08-16: qty 10

## 2021-08-16 MED ORDER — SODIUM CHLORIDE 0.9 % IV SOLN: 250.0000 mL | INTRAVENOUS | Status: DC | PRN

## 2021-08-16 MED ORDER — ONDANSETRON HCL 4 MG PO TABS: 4.0000 mg | ORAL_TABLET | Freq: Four times a day (QID) | ORAL | Status: DC | PRN

## 2021-08-16 MED ORDER — HEPARIN SODIUM (PORCINE) 1000 UNIT/ML DIALYSIS
1000.0000 [IU] | INTRAMUSCULAR | Status: DC | PRN
  Filled 2021-08-16: qty 1

## 2021-08-16 MED ORDER — ASCORBIC ACID 500 MG PO TABS
500.0000 mg | ORAL_TABLET | Freq: Every day | ORAL | Status: DC
  Administered 2021-08-16 – 2021-08-19 (×3): 500 mg via ORAL
  Filled 2021-08-16 (×3): qty 1

## 2021-08-16 NOTE — ED Provider Notes (Addendum)
[facility] EMERGENCY DEPARTMENT Provider Note   CSN: [id number] Arrival date & time: 08/16/21  0945     History Chief Complaint  Patient presents with   Chest Pain    Jenna Brown is a 70 y.o. female.  HPI Patient presents via EMS due to chest pain, nausea, fatigue. Patient has multiple medical issues including CAD, end-stage renal disease.  Last dialysis was 3 days ago.  Today, prior to going to dialysis that she felt onset of left-sided chest pain, heavy, nausea, weakness.  She does have recent COVID exposure with multiple individuals were COVID-positive.  However, she states that she was generally well until about 1 hour ago.  She improved with nitroglycerin, aspirin in route and has no current dyspnea. No reported fever. EMS report patient was tachycardic, but not hypotensive in route.    Past Medical History:  Diagnosis Date   Anemia    Anxiety    takes Xanax prn   Arthritis    Cataract    left eye   CKD (chronic kidney disease), stage IV (HCC)    Coronary artery disease 2016   cath w/ 90% LAD, 95%CFX, 80% OM 2, 60% RCA, not CABG candidate, rx medically   Full dentures    GERD (gastroesophageal reflux disease)    H/O hiatal hernia    Headache(784.0)    when b/p is elevated   Hemorrhoids    Hx of AKA (above knee amputation), right (HCC)    Hyperlipidemia    takes Simvastatin daily   Hypertension    takes Amlodipine/HCTZ/Losartan daily   Migraines    Myocardial infarction ([place]) 1990's   NSTEMI (non-ST elevated myocardial infarction) ([place])    Peripheral vascular disease (HCC)    PONV (postoperative nausea and vomiting)    Stroke ([place])    TIA history   Type II diabetes mellitus ([place])    on Lantus   Vertigo    takes Ativert prn    Patient Active Problem List   Diagnosis Date Noted   NSTEMI (non-ST elevated myocardial infarction) ([place]) 08/16/2021   Dyspepsia 07/01/2021   Long term (current) use of insulin ([place]) 07/01/2021   Vitamin  B12 deficiency 07/01/2021   Vitamin D deficiency 07/01/2021   Above knee amputation of left lower extremity ([place]) 06/29/2021   Hyperglycemia due to type 2 diabetes mellitus ([place]) 06/29/2021   Hyperhomocysteinemia ([place]) 06/29/2021   Recurrent UTI 01/12/2021   Critical lower limb ischemia ([place]) 02/07/2020   Pleural effusion, bilateral 02/26/2019   History of non-ST elevation myocardial infarction (NSTEMI) 09/15/2018   Goals of care, counseling/discussion    Palliative care by specialist    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[facility])    Insulin-requiring or dependent type II diabetes mellitus ([place]) 01/11/2018   CAD in native artery    History of TIA (transient ischemic attack) 05/14/2015   Chronic combined systolic and diastolic heart failure ([place]) 05/14/2015   Hyperkalemia    Essential hypertension    Hyperlipidemia LDL goal <70    Anemia 05/10/2012   Anxiety 05/09/2012   Peripheral vascular disease ([place])     Past Surgical History:  Procedure Laterality Date   ABDOMINAL AORTAGRAM N/A 02/29/2012   Procedure: ABDOMINAL Maxcine Ham;  Surgeon: [name], MD;  Location: [facility] CATH LAB;  Service: Cardiovascular;  Laterality: N/A;   ABDOMINAL AORTOGRAM W/LOWER EXTREMITY N/A 02/11/2020   Procedure: ABDOMINAL AORTOGRAM W/LOWER EXTREMITY;  Surgeon: [name], MD;  Location: [place] CV LAB;  Service: Cardiovascular;  Laterality:  N/A;   ABDOMINAL AORTOGRAM W/LOWER EXTREMITY N/A 03/17/2020   Procedure: ABDOMINAL AORTOGRAM W/LOWER EXTREMITY;  Surgeon: [name], MD;  Location: [place] CV LAB;  Service: Cardiovascular;  Laterality: N/A;   AMPUTATION  05/10/2012   Procedure: AMPUTATION ABOVE KNEE;  Surgeon: [name], MD;  Location: [facility] OR;  Service: Vascular;  Laterality: Right;   open right groin wound noted   aortogram  02/2012   AV FISTULA PLACEMENT Right 03/04/2019   Procedure: ARTERIOVENOUS (AV) FISTULA CREATION;  Surgeon: Rosetta Posner, MD;  Location: [place];  Service: Vascular;  Laterality:  Right;   CARDIAC CATHETERIZATION N/A 04/13/2015   Procedure: Right/Left Heart Cath and Coronary Angiography;  Surgeon: [name], MD;  Location: [place] CV LAB;  Service: Cardiovascular;  Laterality: N/A;   CLEFT PALATE REPAIR     several   COLONOSCOPY     DILATION AND CURETTAGE OF UTERUS     FEMORAL-POPLITEAL BYPASS GRAFT  04/05/2012   Procedure: BYPASS GRAFT FEMORAL-POPLITEAL ARTERY;  Surgeon: [name], MD;  Location: [place];  Service: Vascular;  Laterality: Right;  REVISION   FEMORAL-TIBIAL BYPASS GRAFT  03/29/2012   Procedure: BYPASS GRAFT FEMORAL-TIBIAL ARTERY;  Surgeon: [name], MD;  Location: [facility] OR;  Service: Vascular;  Laterality: Right;  Right femoral to Posterior Tibialis with composite graft of 34mm x 80 cm ringed gortex graft and saphenous vein  ,intraoperative arteriogram   FEMOROPOPLITEAL THROMBECTOMY / EMBOLECTOMY  05/09/2012   IR FLUORO GUIDE CV LINE RIGHT  03/01/2019   IR US GUIDE VASC ACCESS RIGHT  03/01/2019   MULTIPLE TOOTH EXTRACTIONS     MYOMECTOMY     PERIPHERAL VASCULAR INTERVENTION Left 03/17/2020   Procedure: PERIPHERAL VASCULAR INTERVENTION;  Surgeon: [name], MD;  Location: [place] CV LAB;  Service: Cardiovascular;  Laterality: Left;  popliteal   PR VEIN BYPASS GRAFT,AORTO-FEM-POP  05/27/11   Right SFA-Below knee Pop BP   REVISION OF ARTERIOVENOUS GORETEX GRAFT Right 08/21/2019   Procedure: REVISION OF ARTERIOVENOUS FISTULA RIGHT ARM;  Surgeon: Rosetta Posner, MD;  Location: [facility] OR;  Service: Vascular;  Laterality: Right;   TEE WITHOUT CARDIOVERSION N/A 01/03/2017   Procedure: TRANSESOPHAGEAL ECHOCARDIOGRAM (TEE);  Surgeon: Skeet Latch, MD;  Location: [place];  Service: Cardiovascular;  Laterality: N/A;   THORACENTESIS  02/27/2019       TONSILLECTOMY     TUBAL LIGATION  ~ 1990     OB History   No obstetric history on file.     Family History  Problem Relation Age of Onset   Cancer Mother        breast   Diabetes  Father    Cancer Brother    Heart attack Maternal Grandmother    Heart attack Maternal Grandfather     Social History   Tobacco Use   Smoking status: Former    Packs/day: 0.25    Years: 40.00    Pack years: 10.00    Types: Cigarettes    Quit date: 02/27/2012    Years since quitting: 9.4   Smokeless tobacco: Never  Vaping Use   Vaping Use: Never used  Substance Use Topics   Alcohol use: No   Drug use: No    Home Medications Prior to Admission medications   Medication Sig Start Date End Date Taking? Authorizing Provider  acetaminophen (TYLENOL) 500 MG tablet Take 500 mg by mouth at bedtime.   Yes [provider]  ALPRAZolam Duanne Moron) 0.25 MG tablet  Take 1 tablet (0.25 mg total) by mouth 3 (three) times daily as needed for anxiety. 01/03/17  Yes Carlota Raspberry, Tiffany, PA-C  amLODipine (NORVASC) 10 MG tablet Take 10 mg by mouth daily. 06/04/20  Yes [provider]  aspirin 81 MG chewable tablet Chew 81 mg by mouth daily.   Yes [provider]  atorvastatin (LIPITOR) 80 MG tablet Take 80 mg by mouth daily.  01/09/18  Yes [provider]  cholecalciferol (VITAMIN D) 25 MCG (1000 UT) tablet Take 1,000 Units by mouth daily.   Yes [provider]  docusate sodium (COLACE) 100 MG capsule Take 100 mg by mouth daily as needed for mild constipation.   Yes [provider]  Insulin Glargine (BASAGLAR KWIKPEN) 100 UNIT/ML Inject 22 Units into the skin at bedtime.   Yes [provider]  nitroGLYCERIN (NITROSTAT) 0.4 MG SL tablet DISSOLVE 1 TABLET BY MOUTH UNDER THE TONGUE EVERY 5 MINUTES FOR 3 DOSES AS NEEDED FOR CHEST PAIN Patient taking differently: 0.4 mg every 5 (five) minutes as needed for chest pain. 04/28/21  Yes Skeet Latch, MD  oxyCODONE-acetaminophen (PERCOCET/ROXICET) 5-325 MG tablet Take 1 tablet by mouth every 6 (six) hours as needed for severe pain. 04/11/21  Yes Hayden Rasmussen, MD  RENVELA 800 MG tablet Take 800 mg by mouth  3 (three) times daily with meals.    Yes [provider]  ticagrelor (BRILINTA) 90 MG TABS tablet Take 1 tablet (90 mg total) by mouth 2 (two) times daily. 07/13/20  Yes [name], MD  insulin glargine (LANTUS) 100 UNIT/ML injection Inject 0.2 mLs (20 Units total) into the skin at bedtime. Patient not taking: Reported on 08/16/2021 03/06/19   Georgette Shell, MD    Allergies    Plavix [clopidogrel bisulfate], Codeine, Losartan, and Tylenol with codeine #3 [acetaminophen-codeine]  Review of Systems   Review of Systems  Constitutional:        Per HPI, otherwise negative  HENT:         Per HPI, otherwise negative  Respiratory:         Per HPI, otherwise negative  Cardiovascular:        Per HPI, otherwise negative  Gastrointestinal:  Negative for vomiting.  Endocrine:       Negative aside from HPI  Genitourinary:        Neg aside from HPI   Musculoskeletal:        Per HPI, otherwise negative  Skin: Negative.   Allergic/Immunologic: Positive for immunocompromised state.  Neurological:  Negative for syncope.   Physical Exam Updated Vital Signs BP (!) 164/91   Pulse (!) 118   Temp (!) 97.4 F (36.3 C) (Oral)   Resp 18   SpO2 97%   Physical Exam Vitals and nursing note reviewed.  Constitutional:      General: She is not in acute distress.    Appearance: She is well-developed.  HENT:     Head: Normocephalic and atraumatic.  Eyes:     Conjunctiva/sclera: Conjunctivae normal.  Cardiovascular:     Rate and Rhythm: Regular rhythm. Tachycardia present.  Pulmonary:     Effort: Pulmonary effort is normal. No respiratory distress.     Breath sounds: Normal breath sounds. No stridor.  Abdominal:     General: There is no distension.  Musculoskeletal:     Comments: L AKA  Skin:    General: Skin is warm and dry.  Neurological:     Mental Status: She is alert  and oriented to person, place, and time.     Cranial Nerves: No cranial nerve deficit.    ED  Results / Procedures / Treatments   Labs (all labs ordered are listed, but only abnormal results are displayed) Labs Reviewed  RESP PANEL BY RT-PCR (FLU A&B, COVID) ARPGX2 - Abnormal; Notable for the following components:      Result Value   SARS Coronavirus 2 by RT PCR POSITIVE (*)    All other components within normal limits  COMPREHENSIVE METABOLIC PANEL - Abnormal; Notable for the following components:   Glucose, Bld 146 (*)    BUN 50 (*)    Creatinine, Ser 7.74 (*)    Calcium 8.6 (*)    Total Protein 6.1 (*)    Albumin 2.9 (*)    GFR, Estimated 5 (*)    All other components within normal limits  BRAIN NATRIURETIC PEPTIDE - Abnormal; Notable for the following components:   B Natriuretic Peptide 517.6 (*)    All other components within normal limits  CBC WITH DIFFERENTIAL/PLATELET - Abnormal; Notable for the following components:   RBC 3.47 (*)    Hemoglobin 10.5 (*)    HCT 34.7 (*)    All other components within normal limits  CBG MONITORING, ED - Abnormal; Notable for the following components:   Glucose-Capillary 149 (*)    All other components within normal limits  TROPONIN I (HIGH SENSITIVITY) - Abnormal; Notable for the following components:   Troponin I (High Sensitivity) 424 (*)    All other components within normal limits  TROPONIN I (HIGH SENSITIVITY) - Abnormal; Notable for the following components:   Troponin I (High Sensitivity) 381 (*)    All other components within normal limits  MAGNESIUM  PROTIME-INR  HEPARIN LEVEL (UNFRACTIONATED)    EKG EKG Interpretation  Date/Time:  Monday August 16 2021 09:58:35 EST Ventricular Rate:  123 PR Interval:  150 QRS Duration: 94 QT Interval:  316 QTC Calculation: 452 R Axis:   46 Text Interpretation: Sinus tachycardia Anterior infarct , age undetermined ST-t wave abnormality Abnormal ECG Confirmed by Carmin Muskrat [id number]) on 08/16/2021 10:02:36 AM  Radiology DG Chest 1 View  Result Date: 08/16/2021 CLINICAL  DATA:  Chest pain, shortness of breath. EXAM: CHEST  1 VIEW COMPARISON:  February 27, 2019. FINDINGS: Stable cardiomediastinal silhouette. Both lungs are clear. The visualized skeletal structures are unremarkable. IMPRESSION: No active disease. Electronically Signed   By: Marijo Conception M.D.   On: 08/16/2021 10:27    Procedures Procedures   Medications Ordered in ED Medications  aspirin chewable tablet 324 mg (0 mg Oral Hold 08/16/21 1006)  heparin bolus via infusion 4,000 Units (has no administration in time range)  heparin ADULT infusion 100 units/mL (25000 units/234mL) (has no administration in time range)  sodium chloride 0.9 % bolus 500 mL (0 mLs Intravenous Stopped 08/16/21 1200)    ED Course  I have reviewed the triage vital signs and the nursing notes.  Pertinent labs & imaging results that were available during my care of the patient were reviewed by me and considered in my medical decision making (see chart for details).  Cardiac 120 sinus tach abnormal Pulse ox 99% room air normal   Update: Patient is chest pain-free.  Heart rate now diminished, 110.  I reviewed her initial findings, including labs notable for elevated troponin.   2:19 PM No chest pain.  Second troponin decreased from initial value.  Patient's remaining labs notable for COVID-positive finding, consistent  with the patient's own suspicion about her status.  I discussed the patient's case with our cardiology colleagues.  Heparin will be started, and they will follow as a consulting service.  Patient will start remdesivir.  I discussed patient's case with our internal medicine colleague, Dr. Eliberto Ivory for admission.  Adult female with multiple medical issues including end-stage renal disease, CAD, vasculopathy presents with fatigue, cough, chest pain.  Chest pain resolves with nitroglycerin, tachycardia for substantially, but the patient is found to multiple abnormalities including positive COVID test and elevated  troponin.  EKG does have some ST changes, possibly rate related.  Given the resolution of chest pain, I discussed her case with our cardiology colleagues, nephrology (Dr. Carolin Sicks) is aware of her status as she was headed for dialysis today, but does not have acute electrolyte abnormalities requiring emergent dialysis.  Patient admitted for further monitoring, management.  MDM Rules/Calculators/A&P MDM Number of Diagnoses or Management Options Chest pain: new, needed workup COVID: new, needed workup Elevated troponin: new, needed workup   Amount and/or Complexity of Data Reviewed Clinical lab tests: ordered and reviewed Tests in the radiology section of CPT: ordered and reviewed Tests in the medicine section of CPT: reviewed and ordered Decide to obtain previous medical records or to obtain history from someone other than the patient: yes Obtain history from someone other than the patient: yes Review and summarize past medical records: yes Discuss the patient with other providers: yes Independent visualization of images, tracings, or specimens: yes  Risk of Complications, Morbidity, and/or Mortality Presenting problems: high Diagnostic procedures: high Management options: high  Critical Care Total time providing critical care: 30-74 minutes (45)  Patient Progress Patient progress: improved   Final Clinical Impression(s) / ED Diagnoses Final diagnoses:  COVID  Elevated troponin     Carmin Muskrat, MD 08/16/21 1422    Carmin Muskrat, MD 08/16/21 1457
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
## 2021-08-16 NOTE — Progress Notes (Signed)
ANTICOAGULATION CONSULT NOTE - Initial Consult  Pharmacy Consult for heparin Indication: chest pain/ACS  Allergies  Allergen Reactions   Plavix [Clopidogrel Bisulfate] Itching   Codeine Other (See Comments)    "makes me feel strange"   Losartan Other (See Comments)   Tylenol With Codeine #3 [Acetaminophen-Codeine] Other (See Comments)    "doesn't make me feel right"    Patient Measurements:   Heparin Dosing Weight: TBW  Vital Signs: Temp: 97.4 F (36.3 C) (11/28 0956) Temp Source: Oral (11/28 0956) BP: 164/91 (11/28 1330) Pulse Rate: 118 (11/28 1330)  Labs: Recent Labs    08/16/21 1026 08/16/21 1158  HGB 10.5*  --   HCT 34.7*  --   PLT 171  --   LABPROT 13.0  --   INR 1.0  --   CREATININE 7.74*  --   TROPONINIHS 424* 381*    CrCl cannot be calculated (Unknown ideal weight.).   Medical History: Past Medical History:  Diagnosis Date   Anemia    Anxiety    takes Xanax prn   Arthritis    Cataract    left eye   CKD (chronic kidney disease), stage IV (HCC)    Coronary artery disease 2016   cath w/ 90% LAD, 95%CFX, 80% OM 2, 60% RCA, not CABG candidate, rx medically   Full dentures    GERD (gastroesophageal reflux disease)    H/O hiatal hernia    Headache(784.0)    when b/p is elevated   Hemorrhoids    Hx of AKA (above knee amputation), right (HCC)    Hyperlipidemia    takes Simvastatin daily   Hypertension    takes Amlodipine/HCTZ/Losartan daily   Migraines    Myocardial infarction ([place]) 1990's   NSTEMI (non-ST elevated myocardial infarction) ([place])    Peripheral vascular disease (HCC)    PONV (postoperative nausea and vomiting)    Stroke ([place])    TIA history   Type II diabetes mellitus ([place])    on Lantus   Vertigo    takes Ativert prn     Assessment: 62 YOF presenting with CP,SOB, elevated troponin.  She is not on anticoagulation PTA, CBC wnl  Goal of Therapy:  Heparin level 0.3-0.7 units/ml Monitor platelets by anticoagulation protocol:  Yes   Plan:  Heparin 4000 units IV x 1, and gtt at 850 unitshr Fu 8 hour heparin level Fu cards eval and recs  Bertis Ruddy, PharmD Clinical Pharmacist ED Pharmacist Phone # [phone] 08/16/2021 2:14 PM

## 2021-08-16 NOTE — Consult Note (Signed)
Cardiology Consultation:   Patient ID: KAELEI WHEELER MRN: [id number]; DOB: August 29, 1951  Admit date: 08/16/2021 Date of Consult: 08/16/2021  PCP:  [name], MD   [facility] Providers Cardiologist:  Skeet Latch, MD  Advanced Heart Failure:  Glori Bickers, MD       Patient Profile:   Jenna Brown is a 70 y.o. female with a hx of ESRD, CAD who is being seen 08/16/2021 for the evaluation of NSTEMI at the request of Dr. Rogers Blocker.  History of Present Illness:   [name] has end-stage renal disease.  She has a known history of coronary artery disease.  She had a heart cath in 2016.  This showed multivessel CAD including an occluded LAD.  The RCA fills the LAD by collaterals.  She has been medically managed since that time.  There were no targets for PCI.  She felt very tired today.  She had some left-sided chest pressure.  She had several exposures to COVID-positive people recently.  She came to the emergency room and was found to be COVID positive.  Troponin was also mildly elevated.  She was tachycardic.  She felt better as her heart rate was brought down.  Currently, she feels very fatigued.  She wants to sleep.  She does not report any pain in the center of her chest. Past Medical History:  Diagnosis Date   Anemia    Anxiety    takes Xanax prn   Arthritis    Cataract    left eye   CKD (chronic kidney disease), stage IV (HCC)    Coronary artery disease 2016   cath w/ 90% LAD, 95%CFX, 80% OM 2, 60% RCA, not CABG candidate, rx medically   Full dentures    GERD (gastroesophageal reflux disease)    H/O hiatal hernia    Headache(784.0)    when b/p is elevated   Hemorrhoids    Hx of AKA (above knee amputation), right (HCC)    Hyperlipidemia    takes Simvastatin daily   Hypertension    takes Amlodipine/HCTZ/Losartan daily   Migraines    Myocardial infarction ([place]) 1990's   NSTEMI (non-ST elevated myocardial infarction) ([place])    Peripheral vascular disease  (HCC)    PONV (postoperative nausea and vomiting)    Stroke ([place])    TIA history   Type II diabetes mellitus ([place])    on Lantus   Vertigo    takes Ativert prn    Past Surgical History:  Procedure Laterality Date   ABDOMINAL AORTAGRAM N/A 02/29/2012   Procedure: ABDOMINAL Maxcine Ham;  Surgeon: [name], MD;  Location: [facility] CATH LAB;  Service: Cardiovascular;  Laterality: N/A;   ABDOMINAL AORTOGRAM W/LOWER EXTREMITY N/A 02/11/2020   Procedure: ABDOMINAL AORTOGRAM W/LOWER EXTREMITY;  Surgeon: [name], MD;  Location: [facility] CV LAB;  Service: Cardiovascular;  Laterality: N/A;   ABDOMINAL AORTOGRAM W/LOWER EXTREMITY N/A 03/17/2020   Procedure: ABDOMINAL AORTOGRAM W/LOWER EXTREMITY;  Surgeon: [name], MD;  Location: [place] CV LAB;  Service: Cardiovascular;  Laterality: N/A;   AMPUTATION  05/10/2012   Procedure: AMPUTATION ABOVE KNEE;  Surgeon: [name], MD;  Location: [facility] OR;  Service: Vascular;  Laterality: Right;   open right groin wound noted   aortogram  02/2012   AV FISTULA PLACEMENT Right 03/04/2019   Procedure: ARTERIOVENOUS (AV) FISTULA CREATION;  Surgeon: Rosetta Posner, MD;  Location: [place];  Service: Vascular;  Laterality: Right;   CARDIAC CATHETERIZATION N/A 04/13/2015  Procedure: Right/Left Heart Cath and Coronary Angiography;  Surgeon: [name], MD;  Location: [place] CV LAB;  Service: Cardiovascular;  Laterality: N/A;   CLEFT PALATE REPAIR     several   COLONOSCOPY     DILATION AND CURETTAGE OF UTERUS     FEMORAL-POPLITEAL BYPASS GRAFT  04/05/2012   Procedure: BYPASS GRAFT FEMORAL-POPLITEAL ARTERY;  Surgeon: [name], MD;  Location: [place];  Service: Vascular;  Laterality: Right;  REVISION   FEMORAL-TIBIAL BYPASS GRAFT  03/29/2012   Procedure: BYPASS GRAFT FEMORAL-TIBIAL ARTERY;  Surgeon: [name], MD;  Location: [facility] OR;  Service: Vascular;  Laterality: Right;  Right femoral to Posterior Tibialis with composite graft of 38mm  x 80 cm ringed gortex graft and saphenous vein  ,intraoperative arteriogram   FEMOROPOPLITEAL THROMBECTOMY / EMBOLECTOMY  05/09/2012   IR FLUORO GUIDE CV LINE RIGHT  03/01/2019   IR US GUIDE VASC ACCESS RIGHT  03/01/2019   MULTIPLE TOOTH EXTRACTIONS     MYOMECTOMY     PERIPHERAL VASCULAR INTERVENTION Left 03/17/2020   Procedure: PERIPHERAL VASCULAR INTERVENTION;  Surgeon: [name], MD;  Location: [place] CV LAB;  Service: Cardiovascular;  Laterality: Left;  popliteal   PR VEIN BYPASS GRAFT,AORTO-FEM-POP  05/27/11   Right SFA-Below knee Pop BP   REVISION OF ARTERIOVENOUS GORETEX GRAFT Right 08/21/2019   Procedure: REVISION OF ARTERIOVENOUS FISTULA RIGHT ARM;  Surgeon: Rosetta Posner, MD;  Location: [facility] OR;  Service: Vascular;  Laterality: Right;   TEE WITHOUT CARDIOVERSION N/A 01/03/2017   Procedure: TRANSESOPHAGEAL ECHOCARDIOGRAM (TEE);  Surgeon: Skeet Latch, MD;  Location: [facility] ENDOSCOPY;  Service: Cardiovascular;  Laterality: N/A;   THORACENTESIS  02/27/2019       TONSILLECTOMY     TUBAL LIGATION  ~ 1990       Inpatient Medications: Scheduled Meds:  vitamin C  500 mg Oral Daily   aspirin  324 mg Oral Once   [START ON 08/17/2021] Chlorhexidine Gluconate Cloth  6 each Topical Q0600   insulin aspart  0-6 Units Subcutaneous TID WC   sodium chloride flush  3 mL Intravenous Q12H   zinc sulfate  220 mg Oral Daily   Continuous Infusions:  sodium chloride     heparin 850 Units/hr (08/16/21 1440)   PRN Meds:   Allergies:    Allergies  Allergen Reactions   Plavix [Clopidogrel Bisulfate] Itching   Codeine Other (See Comments)    "makes me feel strange"   Losartan Other (See Comments)   Tylenol With Codeine #3 [Acetaminophen-Codeine] Other (See Comments)    "doesn't make me feel right"    Social History:   Social History   Socioeconomic History   Marital status: Single    Spouse name: Not on file   Number of children: Not on file   Years of education: Not on file    Highest education level: Not on file  Occupational History   Occupation: disabled  Tobacco Use   Smoking status: Former    Packs/day: 0.25    Years: 40.00    Pack years: 10.00    Types: Cigarettes    Quit date: 02/27/2012    Years since quitting: 9.4   Smokeless tobacco: Never  Vaping Use   Vaping Use: Never used  Substance and Sexual Activity   Alcohol use: No   Drug use: No   Sexual activity: Never    Birth control/protection: Post-menopausal  Other Topics Concern   Not on file  Social History Narrative  She lives in [place] with family.   Social Determinants of Health   Financial Resource Strain: Not on file  Food Insecurity: Not on file  Transportation Needs: Not on file  Physical Activity: Not on file  Stress: Not on file  Social Connections: Not on file  Intimate Partner Violence: Not on file    Family History:    Family History  Problem Relation Age of Onset   Cancer Mother        breast   Diabetes Father    Cancer Brother    Heart attack Maternal Grandmother    Heart attack Maternal Grandfather      ROS:  Please see the history of present illness.  fatigue All other ROS reviewed and negative.     Physical Exam/Data:   Vitals:   08/16/21 1122 08/16/21 1301 08/16/21 1330 08/16/21 1630  BP: (!) 156/62 (!) 188/78 (!) 164/91 133/80  Pulse: 80 (!) 118 (!) 118 83  Resp: (!) 21 (!) 22 18 20   Temp:      TempSrc:      SpO2: 96% 97% 97% 95%   No intake or output data in the 24 hours ending 08/16/21 1717 Last 3 Weights 11/10/2020 04/21/2020 03/17/2020  Weight (lbs) 152 lb 153 lb 153 lb  Weight (kg) 68.947 kg 69.4 kg 69.4 kg     There is no height or weight on file to calculate BMI.  General:  Well nourished, well developed, in no acute distress HEENT: normal Neck: no JVD Vascular: No carotid bruits; Distal pulses 2+ bilaterally Cardiac:  normal S1, S2; RRR; no murmur  Lungs:  clear to auscultation bilaterally, no wheezing, rhonchi or rales  Abd:  soft, nontender, no hepatomegaly  Ext: no edema Musculoskeletal:  No deformities, BUE and BLE strength normal and equal Skin: warm and dry  Neuro:  CNs 2-12 intact, no focal abnormalities noted Psych:  Normal affect   EKG:  The EKG was personally reviewed and demonstrates:  sinus tach, LVH with associated ST changes Telemetry:  Telemetry was personally reviewed and demonstrates:  sinus tach  Relevant CV Studies: 2016 cath films reviewed  Laboratory Data:  High Sensitivity Troponin:   Recent Labs  Lab 08/16/21 1026 08/16/21 1158  TROPONINIHS 424* 381*     Chemistry Recent Labs  Lab 08/16/21 1026  NA 137  K 4.7  CL 104  CO2 25  GLUCOSE 146*  BUN 50*  CREATININE 7.74*  CALCIUM 8.6*  MG 2.0  GFRNONAA 5*  ANIONGAP 8    Recent Labs  Lab 08/16/21 1026  PROT 6.1*  ALBUMIN 2.9*  AST 21  ALT 14  ALKPHOS 72  BILITOT 0.6   Lipids No results for input(s): CHOL, TRIG, HDL, LABVLDL, LDLCALC, CHOLHDL in the last 168 hours.  Hematology Recent Labs  Lab 08/16/21 1026  WBC 4.6  RBC 3.47*  HGB 10.5*  HCT 34.7*  MCV 100.0  MCH 30.3  MCHC 30.3  RDW 14.2  PLT 171   Thyroid No results for input(s): TSH, FREET4 in the last 168 hours.  BNP Recent Labs  Lab 08/16/21 0958  BNP 517.6*    DDimer No results for input(s): DDIMER in the last 168 hours.   Radiology/Studies:  DG Chest 1 View  Result Date: 08/16/2021 CLINICAL DATA:  Chest pain, shortness of breath. EXAM: CHEST  1 VIEW COMPARISON:  February 27, 2019. FINDINGS: Stable cardiomediastinal silhouette. Both lungs are clear. The visualized skeletal structures are unremarkable. IMPRESSION: No active disease. Electronically Signed  By: Marijo Conception M.D.   On: 08/16/2021 10:27     Assessment and Plan:   CAD/NSTEMI: I suspect that her elevated troponin is due to demand ischemia in the setting of COVID infection.  She will be started on remdesivir.  We will also run IV heparin for now.  Her 2016 cath showed generally  small coronaries which were diffusely diseased.  Right to left collaterals were present at the apex.  Would not plan for repeat catheterization at this time.  Supportive care.  End-stage renal disease: Renal is consulted and will coordinate her dialysis.  Given her diffuse CAD, she is at high risk for coronary event.  She is not a candidate for bypass surgery.  We will try to maximize medical therapy.   Risk Assessment/Risk Scores:     TIMI Risk Score for Unstable Angina or Non-ST Elevation MI:   The patient's TIMI risk score is 7, which indicates a 41% risk of all cause mortality, new or recurrent myocardial infarction or need for urgent revascularization in the next 14 days.          For questions or updates, please contact [place] Please consult [email or web address] for contact info under    Signed, Larae Grooms, MD  08/16/2021 5:17 PM

## 2021-08-16 NOTE — Consult Note (Signed)
[facility] Nephrology Consult Note: Reason for Consult: To manage dialysis and dialysis related needs Referring Physician: Dr Rogers Blocker, [name]  HPI:  Jenna Brown is an 70 y.o. female with history of hypertension, DM, HLD, CAD, peripheral vascular disease status post right AKA, anemia, arthritis, ESRD on HD MWF presented with chest pain and shortness of breath.  Patient reported that she goes to dialysis center in [facility] but does not really remember the centers name.  She thinks it is probably [facility].  She reported that her last dialysis was on Friday and today she was on her way to dialysis when she felt chest pain therefore came to the hospital.  Patient was given sublingual aspirin by EMS.  She had recent COVID exposure with multiple individual who are COVID-positive.  In the ER patient was tachycardic, oxygen saturation was normal and not hypotensive.  She was found to be COVID-positive. The labs showed potassium 4.7, BUN 50, creatinine 7.74, troponin peaked at 424, hemoglobin 10.5.  Chest x-ray negative.  Patient was started on heparin and admitted for further evaluation. Currently patient reported that her chest pain has gone and does not have shortness of breath.  She said she usually get dialysis about 3 hours and not much UF because she still makes urine.  OP HD orders: NEPHROLOGIST: Cay Schillings MD, [facility], MWF, HD TIME: 210, 2K.    Past Medical History:  Diagnosis Date   Anemia    Anxiety    takes Xanax prn   Arthritis    Cataract    left eye   CKD (chronic kidney disease), stage IV (HCC)    Coronary artery disease 2016   cath w/ 90% LAD, 95%CFX, 80% OM 2, 60% RCA, not CABG candidate, rx medically   Full dentures    GERD (gastroesophageal reflux disease)    H/O hiatal hernia    Headache(784.0)    when b/p is elevated   Hemorrhoids    Hx of AKA (above knee amputation), right (HCC)    Hyperlipidemia    takes Simvastatin daily    Hypertension    takes Amlodipine/HCTZ/Losartan daily   Migraines    Myocardial infarction ([place]) 1990's   NSTEMI (non-ST elevated myocardial infarction) ([place])    Peripheral vascular disease (HCC)    PONV (postoperative nausea and vomiting)    Stroke ([place])    TIA history   Type II diabetes mellitus ([place])    on Lantus   Vertigo    takes Ativert prn    Past Surgical History:  Procedure Laterality Date   ABDOMINAL AORTAGRAM N/A 02/29/2012   Procedure: ABDOMINAL Maxcine Ham;  Surgeon: [name], MD;  Location: [facility] CATH LAB;  Service: Cardiovascular;  Laterality: N/A;   ABDOMINAL AORTOGRAM W/LOWER EXTREMITY N/A 02/11/2020   Procedure: ABDOMINAL AORTOGRAM W/LOWER EXTREMITY;  Surgeon: [name], MD;  Location: [facility] CV LAB;  Service: Cardiovascular;  Laterality: N/A;   ABDOMINAL AORTOGRAM W/LOWER EXTREMITY N/A 03/17/2020   Procedure: ABDOMINAL AORTOGRAM W/LOWER EXTREMITY;  Surgeon: [name], MD;  Location: [place] CV LAB;  Service: Cardiovascular;  Laterality: N/A;   AMPUTATION  05/10/2012   Procedure: AMPUTATION ABOVE KNEE;  Surgeon: [name], MD;  Location: [facility] OR;  Service: Vascular;  Laterality: Right;   open right groin wound noted   aortogram  02/2012   AV FISTULA PLACEMENT Right 03/04/2019   Procedure: ARTERIOVENOUS (AV) FISTULA CREATION;  Surgeon: Rosetta Posner, MD;  Location: [facility];  Service: Vascular;  Laterality: Right;   CARDIAC CATHETERIZATION N/A 04/13/2015   Procedure: Right/Left Heart Cath and Coronary Angiography;  Surgeon: [name], MD;  Location: [facility] CV LAB;  Service: Cardiovascular;  Laterality: N/A;   CLEFT PALATE REPAIR     several   COLONOSCOPY     DILATION AND CURETTAGE OF UTERUS     FEMORAL-POPLITEAL BYPASS GRAFT  04/05/2012   Procedure: BYPASS GRAFT FEMORAL-POPLITEAL ARTERY;  Surgeon: [name], MD;  Location: [place];  Service: Vascular;  Laterality: Right;  REVISION   FEMORAL-TIBIAL BYPASS GRAFT  03/29/2012    Procedure: BYPASS GRAFT FEMORAL-TIBIAL ARTERY;  Surgeon: [name], MD;  Location: [facility] OR;  Service: Vascular;  Laterality: Right;  Right femoral to Posterior Tibialis with composite graft of 44mm x 80 cm ringed gortex graft and saphenous vein  ,intraoperative arteriogram   FEMOROPOPLITEAL THROMBECTOMY / EMBOLECTOMY  05/09/2012   IR FLUORO GUIDE CV LINE RIGHT  03/01/2019   IR US GUIDE VASC ACCESS RIGHT  03/01/2019   MULTIPLE TOOTH EXTRACTIONS     MYOMECTOMY     PERIPHERAL VASCULAR INTERVENTION Left 03/17/2020   Procedure: PERIPHERAL VASCULAR INTERVENTION;  Surgeon: [name], MD;  Location: [facility] LAB;  Service: Cardiovascular;  Laterality: Left;  popliteal   PR VEIN BYPASS GRAFT,AORTO-FEM-POP  05/27/11   Right SFA-Below knee Pop BP   REVISION OF ARTERIOVENOUS GORETEX GRAFT Right 08/21/2019   Procedure: REVISION OF ARTERIOVENOUS FISTULA RIGHT ARM;  Surgeon: Rosetta Posner, MD;  Location: [facility] OR;  Service: Vascular;  Laterality: Right;   TEE WITHOUT CARDIOVERSION N/A 01/03/2017   Procedure: TRANSESOPHAGEAL ECHOCARDIOGRAM (TEE);  Surgeon: Skeet Latch, MD;  Location: [facility];  Service: Cardiovascular;  Laterality: N/A;   THORACENTESIS  02/27/2019       TONSILLECTOMY     TUBAL LIGATION  ~ 1990    Family History  Problem Relation Age of Onset   Cancer Mother        breast   Diabetes Father    Cancer Brother    Heart attack Maternal Grandmother    Heart attack Maternal Grandfather     Social History:  reports that she quit smoking about 9 years ago. Her smoking use included cigarettes. She has a 10.00 pack-year smoking history. She has never used smokeless tobacco. She reports that she does not drink alcohol and does not use drugs.  Allergies:  Allergies  Allergen Reactions   Plavix [Clopidogrel Bisulfate] Itching   Codeine Other (See Comments)    "makes me feel strange"   Losartan Other (See Comments)   Tylenol With Codeine #3 [Acetaminophen-Codeine] Other (See  Comments)    "doesn't make me feel right"    Medications: I have reviewed the patient's current medications.   Results for orders placed or performed during the hospital encounter of 08/16/21 (from the past 48 hour(s))  Brain natriuretic peptide (order if patient c/o SOB ONLY)     Status: Abnormal   Collection Time: 08/16/21  9:58 AM  Result Value Ref Range   B Natriuretic Peptide 517.6 (H) 0.0 - 100.0 pg/mL    Comment: Performed at [facility] [address] [place], [place] [address]  Resp Panel by RT-PCR (Flu A&B, Covid) Nasopharyngeal Swab     Status: Abnormal   Collection Time: 08/16/21  9:58 AM   Specimen: Nasopharyngeal Swab; Nasopharyngeal(NP) swabs in vial transport medium  Result Value Ref Range   SARS Coronavirus 2 by RT PCR POSITIVE (A)  NEGATIVE    Comment: RESULT CALLED TO, READ BACK BY AND VERIFIED WITH: RN B MONTEE [id number] AT 1317 BY CM (NOTE) SARS-CoV-2 target nucleic acids are DETECTED.  The SARS-CoV-2 RNA is generally detectable in upper respiratory specimens during the acute phase of infection. Positive results are indicative of the presence of the identified virus, but do not rule out bacterial infection or co-infection with other pathogens not detected by the test. Clinical correlation with patient history and other diagnostic information is necessary to determine patient infection status. The expected result is Negative.  Fact Sheet for Patients: EntrepreneurPulse.com.au  Fact Sheet for Healthcare Providers: IncredibleEmployment.be  This test is not yet approved or cleared by the [place] FDA and  has been authorized for detection and/or diagnosis of SARS-CoV-2 by FDA under an Emergency Use Authorization (EUA).  This EUA will remain in effect (meaning this test can be  used) for the duration of  the COVID-19 declaration under Section 564(b)(1) of the Act, 21 U.S.C. section 360bbb-3(b)(1), unless the  authorization is terminated or revoked sooner.     Influenza A by PCR NEGATIVE NEGATIVE   Influenza B by PCR NEGATIVE NEGATIVE    Comment: (NOTE) The Xpert Xpress SARS-CoV-2/FLU/RSV plus assay is intended as an aid in the diagnosis of influenza from Nasopharyngeal swab specimens and should not be used as a sole basis for treatment. Nasal washings and aspirates are unacceptable for Xpert Xpress SARS-CoV-2/FLU/RSV testing.  Fact Sheet for Patients: EntrepreneurPulse.com.au  Fact Sheet for Healthcare Providers: IncredibleEmployment.be  This test is not yet approved or cleared by the [place] FDA and has been authorized for detection and/or diagnosis of SARS-CoV-2 by FDA under an Emergency Use Authorization (EUA). This EUA will remain in effect (meaning this test can be used) for the duration of the COVID-19 declaration under Section 564(b)(1) of the Act, 21 U.S.C. section 360bbb-3(b)(1), unless the authorization is terminated or revoked.  Performed at [facility], [place] [address] [place], [place] [address]   CBG monitoring, ED     Status: Abnormal   Collection Time: 08/16/21 10:08 AM  Result Value Ref Range   Glucose-Capillary 149 (H) 70 - 99 mg/dL    Comment: Glucose reference range applies only to samples taken after fasting for at least 8 hours.   Comment 1 Notify RN    Comment 2 Document in Chart   Comprehensive metabolic panel     Status: Abnormal   Collection Time: 08/16/21 10:26 AM  Result Value Ref Range   Sodium 137 135 - 145 mmol/L   Potassium 4.7 3.5 - 5.1 mmol/L   Chloride 104 98 - 111 mmol/L   CO2 25 22 - 32 mmol/L   Glucose, Bld 146 (H) 70 - 99 mg/dL    Comment: Glucose reference range applies only to samples taken after fasting for at least 8 hours.   BUN 50 (H) 8 - 23 mg/dL   Creatinine, Ser 7.74 (H) 0.44 - 1.00 mg/dL   Calcium 8.6 (L) 8.9 - 10.3 mg/dL   Total Protein 6.1 (L) 6.5 - 8.1 g/dL   Albumin 2.9 (L)  3.5 - 5.0 g/dL   AST 21 15 - 41 U/L   ALT 14 0 - 44 U/L   Alkaline Phosphatase 72 38 - 126 U/L   Total Bilirubin 0.6 0.3 - 1.2 mg/dL   GFR, Estimated 5 (L) >60 mL/min    Comment: (NOTE) Calculated using the CKD-EPI Creatinine Equation (2021)    Anion gap 8 5 -  15    Comment: Performed at [facility], [place] [address], [place], [place] [address]  Magnesium     Status: None   Collection Time: 08/16/21 10:26 AM  Result Value Ref Range   Magnesium 2.0 1.7 - 2.4 mg/dL    Comment: Performed at [facility] [address], [place], [place] [address]  Troponin I (High Sensitivity)     Status: Abnormal   Collection Time: 08/16/21 10:26 AM  Result Value Ref Range   Troponin I (High Sensitivity) 424 (HH) <18 ng/L    Comment: CRITICAL RESULT CALLED TO, READ BACK BY AND VERIFIED WITH: B MONTEE RN BY SSTEPHENS [id number] (NOTE) Elevated high sensitivity troponin I (hsTnI) values and significant  changes across serial measurements may suggest ACS but many other  chronic and acute conditions are known to elevate hsTnI results.  Refer to the Links section for chest pain algorithms and additional  guidance. Performed at [facility], [place] [address] [place], [place] [address]   CBC with Differential/Platelet     Status: Abnormal   Collection Time: 08/16/21 10:26 AM  Result Value Ref Range   WBC 4.6 4.0 - 10.5 K/uL   RBC 3.47 (L) 3.87 - 5.11 MIL/uL   Hemoglobin 10.5 (L) 12.0 - 15.0 g/dL   HCT 34.7 (L) 36.0 - 46.0 %   MCV 100.0 80.0 - 100.0 fL   MCH 30.3 26.0 - 34.0 pg   MCHC 30.3 30.0 - 36.0 g/dL   RDW 14.2 11.5 - 15.5 %   Platelets 171 150 - 400 K/uL   nRBC 0.0 0.0 - 0.2 %   Neutrophils Relative % 62 %   Neutro Abs 2.9 1.7 - 7.7 K/uL   Lymphocytes Relative 20 %   Lymphs Abs 0.9 0.7 - 4.0 K/uL   Monocytes Relative 16 %   Monocytes Absolute 0.8 0.1 - 1.0 K/uL   Eosinophils Relative 0 %   Eosinophils Absolute 0.0 0.0 - 0.5 K/uL   Basophils Relative 1 %   Basophils  Absolute 0.0 0.0 - 0.1 K/uL   Immature Granulocytes 1 %   Abs Immature Granulocytes 0.03 0.00 - 0.07 K/uL    Comment: Performed at [facility] [address], [place], [place] [address]  Protime-INR     Status: None   Collection Time: 08/16/21 10:26 AM  Result Value Ref Range   Prothrombin Time 13.0 11.4 - 15.2 seconds   INR 1.0 0.8 - 1.2    Comment: (NOTE) INR goal varies based on device and disease states. Performed at [facility], [place] [address] [place], [place] [address]   Troponin I (High Sensitivity)     Status: Abnormal   Collection Time: 08/16/21 11:58 AM  Result Value Ref Range   Troponin I (High Sensitivity) 381 (HH) <18 ng/L    Comment: CRITICAL VALUE NOTED.  VALUE IS CONSISTENT WITH PREVIOUSLY REPORTED AND CALLED VALUE. (NOTE) Elevated high sensitivity troponin I (hsTnI) values and significant  changes across serial measurements may suggest ACS but many other  chronic and acute conditions are known to elevate hsTnI results.  Refer to the Links section for chest pain algorithms and additional  guidance. Performed at [facility], [place] [address] [place],  [address]     DG Chest 1 View  Result Date: 08/16/2021 CLINICAL DATA:  Chest pain, shortness of breath. EXAM: CHEST  1 VIEW COMPARISON:  February 27, 2019. FINDINGS: Stable cardiomediastinal silhouette. Both  lungs are clear. The visualized skeletal structures are unremarkable. IMPRESSION: No active disease. Electronically Signed   By: Marijo Conception M.D.   On: 08/16/2021 10:27    ROS: As per H&P.  Rest of the systems are negative. Blood pressure (!) 164/91, pulse (!) 118, temperature (!) 97.4 F (36.3 C), temperature source Oral, resp. rate 18, SpO2 97 %. Gen: NAD, comfortable Respiratory: Clear bilateral, no wheezing or crackle Cardiovascular: Regular rate rhythm S1-S2 normal, no rubs GI: Abdomen soft, nontender, nondistended Extremities, right AKA, no edema Skin: No rash or  ulcer Neurology: Alert, awake, following commands, oriented Dialysis Access: Right upper extremity AV fistula,  Assessment/Plan:  #Chest pain/NSTEMI: Currently on heparin and cardiology is being consulted.  Patient currently denies chest pain.  Chest x-ray negative.Further planning including echocardiogram pending.  # ESRD: MWF: [facility].  Plan for regular dialysis today.  No sign of fluid overload.  # Hypertension: Blood pressure variable.  Resume home medication.  Ultrafiltration with dialysis.  # Anemia of ESRD: Hemoglobin at goal.  We will try to obtain outpatient record.  # Metabolic Bone Disease: Check phosphorus level.  Resume [place].  Thank you for the consult.  We will follow with you.  Shimshon Narula Tanna Furry 08/16/2021, 3:57 PM

## 2021-08-16 NOTE — ED Triage Notes (Signed)
Pt arrives via [facility] from home for c/o L sided CP, SOB, and nausea. Pt states this started when she was on her way to dialysis (MWF patient). Pt given nitro SL and ASA by EMS. #20 R FA.

## 2021-08-17 ENCOUNTER — Telehealth: Payer: Self-pay

## 2021-08-17 ENCOUNTER — Observation Stay (HOSPITAL_COMMUNITY): Payer: Medicare Other

## 2021-08-17 DIAGNOSIS — I739 Peripheral vascular disease, unspecified: Secondary | ICD-10-CM | POA: Diagnosis not present

## 2021-08-17 DIAGNOSIS — R531 Weakness: Secondary | ICD-10-CM | POA: Diagnosis not present

## 2021-08-17 DIAGNOSIS — M898X9 Other specified disorders of bone, unspecified site: Secondary | ICD-10-CM | POA: Diagnosis present

## 2021-08-17 DIAGNOSIS — E785 Hyperlipidemia, unspecified: Secondary | ICD-10-CM

## 2021-08-17 DIAGNOSIS — Z8249 Family history of ischemic heart disease and other diseases of the circulatory system: Secondary | ICD-10-CM | POA: Diagnosis not present

## 2021-08-17 DIAGNOSIS — I214 Non-ST elevation (NSTEMI) myocardial infarction: Secondary | ICD-10-CM

## 2021-08-17 DIAGNOSIS — Z87440 Personal history of urinary (tract) infections: Secondary | ICD-10-CM | POA: Diagnosis not present

## 2021-08-17 DIAGNOSIS — F419 Anxiety disorder, unspecified: Secondary | ICD-10-CM | POA: Diagnosis present

## 2021-08-17 DIAGNOSIS — I25110 Atherosclerotic heart disease of native coronary artery with unstable angina pectoris: Secondary | ICD-10-CM | POA: Diagnosis present

## 2021-08-17 DIAGNOSIS — Z95828 Presence of other vascular implants and grafts: Secondary | ICD-10-CM | POA: Diagnosis not present

## 2021-08-17 DIAGNOSIS — Z8673 Personal history of transient ischemic attack (TIA), and cerebral infarction without residual deficits: Secondary | ICD-10-CM | POA: Diagnosis not present

## 2021-08-17 DIAGNOSIS — E1122 Type 2 diabetes mellitus with diabetic chronic kidney disease: Secondary | ICD-10-CM | POA: Diagnosis present

## 2021-08-17 DIAGNOSIS — R0602 Shortness of breath: Secondary | ICD-10-CM | POA: Diagnosis not present

## 2021-08-17 DIAGNOSIS — U071 COVID-19: Secondary | ICD-10-CM | POA: Diagnosis not present

## 2021-08-17 DIAGNOSIS — R079 Chest pain, unspecified: Secondary | ICD-10-CM | POA: Diagnosis not present

## 2021-08-17 DIAGNOSIS — I1311 Hypertensive heart and chronic kidney disease without heart failure, with stage 5 chronic kidney disease, or end stage renal disease: Secondary | ICD-10-CM | POA: Diagnosis not present

## 2021-08-17 DIAGNOSIS — R778 Other specified abnormalities of plasma proteins: Secondary | ICD-10-CM | POA: Diagnosis not present

## 2021-08-17 DIAGNOSIS — I5042 Chronic combined systolic (congestive) and diastolic (congestive) heart failure: Secondary | ICD-10-CM | POA: Diagnosis present

## 2021-08-17 DIAGNOSIS — I132 Hypertensive heart and chronic kidney disease with heart failure and with stage 5 chronic kidney disease, or end stage renal disease: Secondary | ICD-10-CM | POA: Diagnosis present

## 2021-08-17 DIAGNOSIS — I2510 Atherosclerotic heart disease of native coronary artery without angina pectoris: Secondary | ICD-10-CM

## 2021-08-17 DIAGNOSIS — K219 Gastro-esophageal reflux disease without esophagitis: Secondary | ICD-10-CM | POA: Diagnosis present

## 2021-08-17 DIAGNOSIS — I252 Old myocardial infarction: Secondary | ICD-10-CM | POA: Diagnosis not present

## 2021-08-17 DIAGNOSIS — N250 Renal osteodystrophy: Secondary | ICD-10-CM | POA: Diagnosis not present

## 2021-08-17 DIAGNOSIS — Z794 Long term (current) use of insulin: Secondary | ICD-10-CM | POA: Diagnosis not present

## 2021-08-17 DIAGNOSIS — Z87891 Personal history of nicotine dependence: Secondary | ICD-10-CM | POA: Diagnosis not present

## 2021-08-17 DIAGNOSIS — E538 Deficiency of other specified B group vitamins: Secondary | ICD-10-CM | POA: Diagnosis present

## 2021-08-17 DIAGNOSIS — E1151 Type 2 diabetes mellitus with diabetic peripheral angiopathy without gangrene: Secondary | ICD-10-CM | POA: Diagnosis present

## 2021-08-17 DIAGNOSIS — Z7401 Bed confinement status: Secondary | ICD-10-CM | POA: Diagnosis not present

## 2021-08-17 DIAGNOSIS — E559 Vitamin D deficiency, unspecified: Secondary | ICD-10-CM | POA: Diagnosis present

## 2021-08-17 DIAGNOSIS — M1990 Unspecified osteoarthritis, unspecified site: Secondary | ICD-10-CM | POA: Diagnosis present

## 2021-08-17 DIAGNOSIS — N186 End stage renal disease: Secondary | ICD-10-CM | POA: Diagnosis not present

## 2021-08-17 DIAGNOSIS — Z992 Dependence on renal dialysis: Secondary | ICD-10-CM | POA: Diagnosis not present

## 2021-08-17 DIAGNOSIS — D631 Anemia in chronic kidney disease: Secondary | ICD-10-CM | POA: Diagnosis present

## 2021-08-17 LAB — CBC
HCT: 33.6 % — ABNORMAL LOW (ref 36.0–46.0)
HCT: 34.1 % — ABNORMAL LOW (ref 36.0–46.0)
Hemoglobin: 10 g/dL — ABNORMAL LOW (ref 12.0–15.0)
Hemoglobin: 10.2 g/dL — ABNORMAL LOW (ref 12.0–15.0)
MCH: 29.6 pg (ref 26.0–34.0)
MCH: 29.6 pg (ref 26.0–34.0)
MCHC: 29.8 g/dL — ABNORMAL LOW (ref 30.0–36.0)
MCHC: 29.9 g/dL — ABNORMAL LOW (ref 30.0–36.0)
MCV: 99.4 fL (ref 80.0–100.0)
MCV: 98.8 fL (ref 80.0–100.0)
Platelets: 165 10*3/uL (ref 150–400)
Platelets: 172 10*3/uL (ref 150–400)
RBC: 3.38 MIL/uL — ABNORMAL LOW (ref 3.87–5.11)
RBC: 3.45 MIL/uL — ABNORMAL LOW (ref 3.87–5.11)
RDW: 13.9 % (ref 11.5–15.5)
RDW: 13.9 % (ref 11.5–15.5)
WBC: 5.6 10*3/uL (ref 4.0–10.5)
WBC: 4.4 10*3/uL (ref 4.0–10.5)
nRBC: 0 % (ref 0.0–0.2)
nRBC: 0 % (ref 0.0–0.2)

## 2021-08-17 LAB — ECHOCARDIOGRAM COMPLETE
AR max vel: 2.05 cm2
AV Area VTI: 2.32 cm2
AV Area mean vel: 1.82 cm2
AV Mean grad: 4 mmHg
AV Peak grad: 6.2 mmHg
Ao pk vel: 1.24 m/s
Calc EF: 27.8 %
Height: 64 in
S' Lateral: 3.9 cm
Single Plane A2C EF: 29.7 %
Single Plane A4C EF: 28.7 %
Weight: 2432 oz

## 2021-08-17 LAB — HEPARIN LEVEL (UNFRACTIONATED)
Heparin Unfractionated: 0.9 IU/mL — ABNORMAL HIGH (ref 0.30–0.70)
Heparin Unfractionated: 0.48 IU/mL (ref 0.30–0.70)

## 2021-08-17 LAB — COMPREHENSIVE METABOLIC PANEL
ALT: 13 U/L (ref 0–44)
AST: 21 U/L (ref 15–41)
Albumin: 2.8 g/dL — ABNORMAL LOW (ref 3.5–5.0)
Alkaline Phosphatase: 60 U/L (ref 38–126)
Anion gap: 9 (ref 5–15)
BUN: 56 mg/dL — ABNORMAL HIGH (ref 8–23)
CO2: 22 mmol/L (ref 22–32)
Calcium: 8.2 mg/dL — ABNORMAL LOW (ref 8.9–10.3)
Chloride: 106 mmol/L (ref 98–111)
Creatinine, Ser: 7.7 mg/dL — ABNORMAL HIGH (ref 0.44–1.00)
GFR, Estimated: 5 mL/min — ABNORMAL LOW (ref >60)
Glucose, Bld: 81 mg/dL (ref 70–99)
Potassium: 4.5 mmol/L (ref 3.5–5.1)
Sodium: 137 mmol/L (ref 135–145)
Total Bilirubin: 0.6 mg/dL (ref 0.3–1.2)
Total Protein: 5.7 g/dL — ABNORMAL LOW (ref 6.5–8.1)

## 2021-08-17 LAB — CBG MONITORING, ED
Glucose-Capillary: 96 mg/dL (ref 70–99)
Glucose-Capillary: 83 mg/dL (ref 70–99)

## 2021-08-17 LAB — HEPATITIS B CORE ANTIBODY, TOTAL: Hep B Core Total Ab: NONREACTIVE

## 2021-08-17 LAB — HEPATITIS B SURFACE ANTIGEN: Hepatitis B Surface Ag: NONREACTIVE

## 2021-08-17 LAB — RESPIRATORY PANEL BY PCR

## 2021-08-17 LAB — RENAL FUNCTION PANEL
Albumin: 2.7 g/dL — ABNORMAL LOW (ref 3.5–5.0)
Anion gap: 9 (ref 5–15)
BUN: 56 mg/dL — ABNORMAL HIGH (ref 8–23)
CO2: 23 mmol/L (ref 22–32)
Calcium: 8.1 mg/dL — ABNORMAL LOW (ref 8.9–10.3)
Chloride: 105 mmol/L (ref 98–111)
Creatinine, Ser: 7.7 mg/dL — ABNORMAL HIGH (ref 0.44–1.00)
GFR, Estimated: 5 mL/min — ABNORMAL LOW (ref >60)
Glucose, Bld: 122 mg/dL — ABNORMAL HIGH (ref 70–99)
Phosphorus: 3.7 mg/dL (ref 2.5–4.6)
Potassium: 4.6 mmol/L (ref 3.5–5.1)
Sodium: 137 mmol/L (ref 135–145)

## 2021-08-17 LAB — FIBRINOGEN: Fibrinogen: 501 mg/dL — ABNORMAL HIGH (ref 210–475)

## 2021-08-17 LAB — FERRITIN
Ferritin: 1376 ng/mL — ABNORMAL HIGH (ref 11–307)
Ferritin: 1270 ng/mL — ABNORMAL HIGH (ref 11–307)

## 2021-08-17 LAB — LACTATE DEHYDROGENASE: LDH: 155 U/L (ref 98–192)

## 2021-08-17 LAB — HEPATITIS B SURFACE ANTIBODY,QUALITATIVE: Hep B S Ab: NONREACTIVE

## 2021-08-17 LAB — HIV ANTIBODY (ROUTINE TESTING W REFLEX): HIV Screen 4th Generation wRfx: NONREACTIVE

## 2021-08-17 LAB — LIPID PANEL
Cholesterol: 136 mg/dL (ref 0–200)
HDL: 48 mg/dL (ref >40)
LDL Cholesterol: 68 mg/dL (ref 0–99)
Total CHOL/HDL Ratio: 2.8 RATIO
Triglycerides: 101 mg/dL (ref <150)
VLDL: 20 mg/dL (ref 0–40)

## 2021-08-17 LAB — GLUCOSE, CAPILLARY: Glucose-Capillary: 161 mg/dL — ABNORMAL HIGH (ref 70–99)

## 2021-08-17 LAB — C-REACTIVE PROTEIN
CRP: 3.5 mg/dL — ABNORMAL HIGH (ref <1.0)
CRP: 2.7 mg/dL — ABNORMAL HIGH (ref <1.0)

## 2021-08-17 LAB — D-DIMER, QUANTITATIVE
D-Dimer, Quant: 1.16 ug/mL-FEU — ABNORMAL HIGH (ref 0.00–0.50)
D-Dimer, Quant: 1.07 ug/mL-FEU — ABNORMAL HIGH (ref 0.00–0.50)

## 2021-08-17 LAB — MRSA NEXT GEN BY PCR, NASAL: MRSA by PCR Next Gen: NOT DETECTED

## 2021-08-17 MED ORDER — CARVEDILOL 3.125 MG PO TABS
3.1250 mg | ORAL_TABLET | Freq: Two times a day (BID) | ORAL | Status: DC
  Administered 2021-08-17 – 2021-08-19 (×3): 3.125 mg via ORAL
  Filled 2021-08-17 (×2): qty 1

## 2021-08-17 MED ORDER — CARVEDILOL 3.125 MG PO TABS
ORAL_TABLET | ORAL | Status: AC
  Filled 2021-08-17: qty 1

## 2021-08-17 MED ORDER — ISOSORBIDE MONONITRATE ER 30 MG PO TB24
15.0000 mg | ORAL_TABLET | Freq: Every day | ORAL | Status: DC
  Administered 2021-08-18 – 2021-08-19 (×2): 15 mg via ORAL
  Filled 2021-08-17 (×2): qty 1

## 2021-08-17 MED ORDER — CHLORHEXIDINE GLUCONATE CLOTH 2 % EX PADS
6.0000 | MEDICATED_PAD | Freq: Every day | CUTANEOUS | Status: DC
  Administered 2021-08-18: 6 via TOPICAL

## 2021-08-17 MED ORDER — PANTOPRAZOLE SODIUM 40 MG PO TBEC
40.0000 mg | DELAYED_RELEASE_TABLET | Freq: Every day | ORAL | Status: DC
  Administered 2021-08-18 – 2021-08-19 (×2): 40 mg via ORAL
  Filled 2021-08-17 (×2): qty 1

## 2021-08-17 MED ORDER — PERFLUTREN LIPID MICROSPHERE
1.0000 mL | INTRAVENOUS | Status: AC | PRN
Start: 2021-08-17 — End: 2021-08-17
  Administered 2021-08-17: 2 mL via INTRAVENOUS
  Filled 2021-08-17: qty 10

## 2021-08-17 MED ORDER — HYDRALAZINE HCL 10 MG PO TABS
10.0000 mg | ORAL_TABLET | Freq: Three times a day (TID) | ORAL | Status: DC
  Administered 2021-08-17 – 2021-08-19 (×6): 10 mg via ORAL
  Filled 2021-08-17 (×5): qty 1

## 2021-08-17 MED ORDER — HYDRALAZINE HCL 10 MG PO TABS
ORAL_TABLET | ORAL | Status: AC
  Filled 2021-08-17: qty 1

## 2021-08-17 MED ORDER — ALUM & MAG HYDROXIDE-SIMETH 200-200-20 MG/5ML PO SUSP
30.0000 mL | Freq: Three times a day (TID) | ORAL | Status: DC
  Administered 2021-08-17: 30 mL via ORAL

## 2021-08-17 MED ORDER — ALUM & MAG HYDROXIDE-SIMETH 200-200-20 MG/5ML PO SUSP
ORAL | Status: AC
  Filled 2021-08-17: qty 30

## 2021-08-17 NOTE — Progress Notes (Signed)
[facility] for heparin Indication: chest pain/ACS  Allergies  Allergen Reactions   Plavix [Clopidogrel Bisulfate] Itching   Codeine Other (See Comments)    "makes me feel strange"   Losartan Other (See Comments)   Tylenol With Codeine #3 [Acetaminophen-Codeine] Other (See Comments)    "doesn't make me feel right"    Patient Measurements: Height: 5\' 4"  (162.6 cm) Weight: 68.9 kg (152 lb) IBW/kg (Calculated) : 54.7 Heparin Dosing Weight: TBW  Vital Signs: Temp: 98.3 F (36.8 C) (11/28 2315) Temp Source: Oral (11/28 2315) BP: 142/65 (11/28 2315) Pulse Rate: 118 (11/28 2315)  Labs: Recent Labs    08/16/21 1026 08/16/21 1158 08/16/21 2304  HGB 10.5*  --  10.0*  HCT 34.7*  --  33.6*  PLT 171  --  165  LABPROT 13.0  --   --   INR 1.0  --   --   HEPARINUNFRC  --   --  0.90*  CREATININE 7.74*  --  7.70*  TROPONINIHS 424* 381*  --      Estimated Creatinine Clearance: 6.7 mL/min (A) (by C-G formula based on SCr of 7.7 mg/dL (H)).  Assessment: 69 YOF presenting with CP,SOB, elevated troponin.  She is not on anticoagulation PTA, CBC wnl  Heparin level supratherapeutic (0.9) on gtt at 850 units/hr. No bleeding noted.  Goal of Therapy:  Heparin level 0.3-0.7 units/ml Monitor platelets by anticoagulation protocol: Yes   Plan:  Decrease heparin to 700 units/hr F/u 8 hr heparin level  Sherlon Handing, PharmD, BCPS Please see amion for complete clinical pharmacist phone list 08/17/2021 12:50 AM

## 2021-08-17 NOTE — Progress Notes (Signed)
Progress Note  Patient Name: Jenna Brown Date of Encounter: 08/17/2021  [facility] Cardiologist: Skeet Latch, MD   Subjective   No chest pain.  Still fatigued.  Inpatient Medications    Scheduled Meds:  acetaminophen  500 mg Oral QHS   alum & mag hydroxide-simeth  30 mL Oral TID AC   alum & mag hydroxide-simeth       vitamin C  500 mg Oral Daily   aspirin  324 mg Oral Once   aspirin  81 mg Oral Daily   atorvastatin  80 mg Oral Daily   carvedilol       carvedilol  3.125 mg Oral BID WC   Chlorhexidine Gluconate Cloth  6 each Topical Q0600   [START ON 08/18/2021] Chlorhexidine Gluconate Cloth  6 each Topical Q0600   cholecalciferol  1,000 Units Oral Daily   hydrALAZINE       hydrALAZINE  10 mg Oral Q8H   insulin aspart  0-6 Units Subcutaneous TID WC   isosorbide mononitrate  15 mg Oral Daily   molnupiravir EUA  4 capsule Oral BID   pantoprazole  40 mg Oral Daily   sevelamer carbonate  800 mg Oral TID WC   sodium chloride flush  3 mL Intravenous Q12H   ticagrelor  90 mg Oral BID   zinc sulfate  220 mg Oral Daily   Continuous Infusions:  sodium chloride     sodium chloride     sodium chloride     heparin 700 Units/hr (08/17/21 0113)   PRN Meds: sodium chloride, sodium chloride, sodium chloride, acetaminophen **OR** acetaminophen, ALPRAZolam, alteplase, docusate sodium, guaiFENesin-dextromethorphan, heparin, lidocaine (PF), lidocaine-prilocaine, nitroGLYCERIN, ondansetron **OR** ondansetron (ZOFRAN) IV, oxyCODONE-acetaminophen, pentafluoroprop-tetrafluoroeth, sodium chloride flush   Vital Signs    Vitals:   08/17/21 1630 08/17/21 1735 08/17/21 1845 08/17/21 2052  BP: (!) 147/76 (!) 151/73 116/85 118/69  Pulse: 91 90 88   Resp: 16  (!) 24 17  Temp:    99.5 F (37.5 C)  TempSrc:    Oral  SpO2: 99%  94% 95%  Weight:      Height:    5\' 4"  (1.626 m)    Intake/Output Summary (Last 24 hours) at 08/17/2021 2116 Last data filed at 08/17/2021 2059 Gross  per 24 hour  Intake --  Output 1021 ml  Net -1021 ml   Last 3 Weights 08/17/2021 08/16/2021 11/10/2020  Weight (lbs) (No Data) 152 lb 152 lb  Weight (kg) (No Data) 68.947 kg 68.947 kg      Telemetry    NSR - Personally Reviewed  ECG    Physical Exam   GEN: No acute distress.   Neck: No JVD Cardiac: RRR, no murmurs, rubs, or gallops.  Respiratory: Clear to auscultation bilaterally. GI: Soft, nontender, non-distended  MS: No edema; No deformity. Neuro:  Nonfocal  Psych: Normal affect   Labs    High Sensitivity Troponin:   Recent Labs  Lab 08/16/21 1026 08/16/21 1158  TROPONINIHS 424* 381*     Chemistry Recent Labs  Lab 08/16/21 1026 08/16/21 2304 08/17/21 0645  NA 137 137 137  K 4.7 4.6 4.5  CL 104 105 106  CO2 25 23 22   GLUCOSE 146* 122* 81  BUN 50* 56* 56*  CREATININE 7.74* 7.70* 7.70*  CALCIUM 8.6* 8.1* 8.2*  MG 2.0  --   --   PROT 6.1*  --  5.7*  ALBUMIN 2.9* 2.7* 2.8*  AST 21  --  21  ALT  14  --  13  ALKPHOS 72  --  60  BILITOT 0.6  --  0.6  GFRNONAA 5* 5* 5*  ANIONGAP 8 9 9     Lipids  Recent Labs  Lab 08/17/21 0645  CHOL 136  TRIG 101  HDL 48  LDLCALC 68  CHOLHDL 2.8    Hematology Recent Labs  Lab 08/16/21 1026 08/16/21 2304 08/17/21 0645  WBC 4.6 5.6 4.4  RBC 3.47* 3.38* 3.45*  HGB 10.5* 10.0* 10.2*  HCT 34.7* 33.6* 34.1*  MCV 100.0 99.4 98.8  MCH 30.3 29.6 29.6  MCHC 30.3 29.8* 29.9*  RDW 14.2 13.9 13.9  PLT 171 165 172   Thyroid No results for input(s): TSH, FREET4 in the last 168 hours.  BNP Recent Labs  Lab 08/16/21 0958  BNP 517.6*    DDimer  Recent Labs  Lab 08/16/21 2304 08/17/21 0645  DDIMER 1.16* 1.07*     Radiology    DG Chest 1 View  Result Date: 08/16/2021 CLINICAL DATA:  Chest pain, shortness of breath. EXAM: CHEST  1 VIEW COMPARISON:  February 27, 2019. FINDINGS: Stable cardiomediastinal silhouette. Both lungs are clear. The visualized skeletal structures are unremarkable. IMPRESSION: No active  disease. Electronically Signed   By: Marijo Conception M.D.   On: 08/16/2021 10:27   DG Chest Port 1 View  Result Date: 08/17/2021 CLINICAL DATA:  70 year old female positive COVID-19. Shortness of breath. EXAM: PORTABLE CHEST 1 VIEW COMPARISON:  Portable chest 08/16/2021 and earlier. FINDINGS: Portable AP semi upright view at 0720 hours. Stable low lung volumes. Allowing for portable technique the lungs are clear. Normal cardiac size and mediastinal contours. Visualized tracheal air column is within normal limits. Chronic surgical clips projecting over the proximal humerus. No acute osseous abnormality identified. Negative visible bowel gas pattern. IMPRESSION: Stable low lung volumes with no acute cardiopulmonary abnormality. Electronically Signed   By: Genevie Ann M.D.   On: 08/17/2021 07:39   ECHOCARDIOGRAM COMPLETE  Result Date: 08/17/2021    ECHOCARDIOGRAM REPORT   Patient Name:   Jenna Brown Date of Exam: 08/17/2021 Medical Rec #:  [id number]     Height:       64.0 in Accession #:    [id number]    Weight:       152.0 lb Date of Birth:  05/03/1951    BSA:          1.741 m Patient Age:    22 years      BP:           142/65 mmHg Patient Gender: F             HR:           118 bpm. Exam Location:  Inpatient Procedure: 2D Echo, Cardiac Doppler, Color Doppler and Intracardiac            Opacification Agent Indications:     NSTEMI  History:         Patient has prior history of Echocardiogram examinations, most                  recent 08/01/2018. CAD; Risk Factors:Hypertension.  Sonographer:     Glo Herring Referring Phys:  [id number] Orma Flaming Diagnosing Phys: Eleonore Chiquito MD IMPRESSIONS  1. The anterolateral wall is akinetic. The mid and distal septum, entire apex, and mid inferoseptum are akinetic. All other segments are hypokinetic. Findings consistent with multivessel CAD. No LV thrombus on contrast imaging. Left ventricular ejection  fraction, by estimation, is 20 to 25%. The left ventricle has  severely decreased function. The left ventricle demonstrates regional wall motion abnormalities (see scoring diagram/findings for description). Indeterminate diastolic filling due to E-A fusion.  2. Right ventricular systolic function is normal. The right ventricular size is normal. Tricuspid regurgitation signal is inadequate for assessing PA pressure.  3. The mitral valve is degenerative. Trivial mitral valve regurgitation. No evidence of mitral stenosis.  4. Focal calcification of the LCC noted. The aortic valve is tricuspid. There is mild calcification of the aortic valve. Aortic valve regurgitation is not visualized. Aortic valve sclerosis/calcification is present, without any evidence of aortic stenosis.  5. The inferior vena cava is normal in size with greater than 50% respiratory variability, suggesting right atrial pressure of 3 mmHg. Comparison(s): Changes from prior study are noted. EF now severely reduced 20-25%. Apical akinesis was present on prior study. Lateral akinesis now present with global hypokinesis in remaining segments. FINDINGS  Left Ventricle: The anterolateral wall is akinetic. The mid and distal septum, entire apex, and mid inferoseptum are akinetic. All other segments are hypokinetic. Findings consistent with multivessel CAD. No LV thrombus on contrast imaging. Left ventricular ejection fraction, by estimation, is 20 to 25%. The left ventricle has severely decreased function. The left ventricle demonstrates regional wall motion abnormalities. Definity contrast agent was given IV to delineate the left ventricular endocardial borders. The left ventricular internal cavity size was normal in size. There is no left ventricular hypertrophy. Indeterminate diastolic filling due to E-A fusion.  LV Wall Scoring: The antero-lateral wall, mid and distal anterior septum, entire apex, and mid inferoseptal segment are akinetic. The anterior wall, inferior wall, posterior wall, basal anteroseptal  segment, and basal inferoseptal segment are hypokinetic. Right Ventricle: The right ventricular size is normal. No increase in right ventricular wall thickness. Right ventricular systolic function is normal. Tricuspid regurgitation signal is inadequate for assessing PA pressure. Left Atrium: Left atrial size was normal in size. Right Atrium: Right atrial size was normal in size. Pericardium: Trivial pericardial effusion is present. Mitral Valve: The mitral valve is degenerative in appearance. Mild mitral annular calcification. Trivial mitral valve regurgitation. No evidence of mitral valve stenosis. Tricuspid Valve: The tricuspid valve is grossly normal. Tricuspid valve regurgitation is not demonstrated. No evidence of tricuspid stenosis. Aortic Valve: Focal calcification of the LCC noted. The aortic valve is tricuspid. There is mild calcification of the aortic valve. Aortic valve regurgitation is not visualized. Aortic valve sclerosis/calcification is present, without any evidence of aortic stenosis. Aortic valve mean gradient measures 4.0 mmHg. Aortic valve peak gradient measures 6.2 mmHg. Aortic valve area, by VTI measures 2.32 cm. Pulmonic Valve: The pulmonic valve was grossly normal. Pulmonic valve regurgitation is not visualized. No evidence of pulmonic stenosis. Aorta: The aortic root and ascending aorta are structurally normal, with no evidence of dilitation. Venous: The inferior vena cava is normal in size with greater than 50% respiratory variability, suggesting right atrial pressure of 3 mmHg. IAS/Shunts: The atrial septum is grossly normal.  LEFT VENTRICLE PLAX 2D LVIDd:         4.50 cm LVIDs:         3.90 cm LV PW:         1.10 cm LV IVS:        1.10 cm LVOT diam:     2.00 cm LV SV:         51 LV SV Index:   29 LVOT Area:     3.14  cm  LV Volumes (MOD) LV vol d, MOD A2C: 158.0 ml LV vol d, MOD A4C: 178.0 ml LV vol s, MOD A2C: 111.0 ml LV vol s, MOD A4C: 127.0 ml LV SV MOD A2C:     47.0 ml LV SV MOD  A4C:     178.0 ml LV SV MOD BP:      47.9 ml RIGHT VENTRICLE          IVC RV Basal diam:  3.50 cm  IVC diam: 1.50 cm LEFT ATRIUM           Index        RIGHT ATRIUM          Index LA diam:      4.60 cm 2.64 cm/m   RA Area:     8.55 cm LA Vol (A2C): 42.5 ml 24.41 ml/m  RA Volume:   16.10 ml 9.25 ml/m LA Vol (A4C): 47.6 ml 27.34 ml/m  AORTIC VALVE                    PULMONIC VALVE AV Area (Vmax):    2.05 cm     PV Vmax:       1.09 m/s AV Area (Vmean):   1.82 cm     PV Peak grad:  4.8 mmHg AV Area (VTI):     2.32 cm AV Vmax:           124.00 cm/s AV Vmean:          93.200 cm/s AV VTI:            0.219 m AV Peak Grad:      6.2 mmHg AV Mean Grad:      4.0 mmHg LVOT Vmax:         80.80 cm/s LVOT Vmean:        53.900 cm/s LVOT VTI:          0.162 m LVOT/AV VTI ratio: 0.74  AORTA Ao Root diam: 3.30 cm Ao Asc diam:  3.00 cm  SHUNTS Systemic VTI:  0.16 m Systemic Diam: 2.00 cm Eleonore Chiquito MD Electronically signed by Eleonore Chiquito MD Signature Date/Time: 08/17/2021/9:44:58 AM    Final (Updated)     Cardiac Studies   Low LVEF by echo  Patient Profile     70 y.o. female with known, longstanding CAD- no options for revascularization  Assessment & Plan    Elevated troponin/CAD: troponin was flat/downtrending, unlike ACS pattern.  EF now lower with increased BNP-likley from long standing CAD.  Troponin may be from acute systolic heart failure.  COVID myocarditis seems less likely.   CP free.  May need to consider cath once she is done with her COVID treatment.  Medical therapy for now.    ESRD: Volume management through dialysis.   High dose statin.  Will titrate beta blocker.  Heparin for 48 hours.      For questions or updates, please contact [facility] Please consult [email or web address] for contact info under        Signed, Larae Grooms, MD  08/17/2021, 9:16 PM

## 2021-08-17 NOTE — Telephone Encounter (Signed)
Patient currently at [facility]. Spoke with patient's sister and left VM for patient that Thursday aortogram would be cancelled per MD due to possible NSTEMI and recent covid diagnosis. They will call back to reschedule once patient is feeling better.

## 2021-08-17 NOTE — Progress Notes (Signed)
PROGRESS NOTE                                                                                                                                                                                                             Patient Demographics:    Jenna Brown, is a 70 y.o. female, DOB - August 08, 1951, SAY:[id number]  Outpatient Primary MD for the patient is [name], [name], MD    LOS - 0  Admit date - 08/16/2021    Chief Complaint  Patient presents with   Chest Pain       Brief Narrative (HPI from H&P)    Jenna Brown is a 70 y.o. female with medical history significant of 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MWF, PAD s/p right AKA and left LE SFA and popliteal artery stent placement for non healing foot wound, GERD, T2DM, HLD, HTN, CAD w/ hx of NSTEMI, hx of TIA, recurrent UTI, anxiety and vitamin B12 and D deficiency who presented to [facility] with chest pain and nausea that started at home she was diagnosed with NSTEMI, seen by cardiology and admitted to the hospital for further care.    Subjective:    [name] today has, No headache, mild chest pain, No abdominal pain - No Nausea, No new weakness tingling or numbness, no SOB.   Assessment  & Plan :      Chest pain with possible NSTEMI in a patient with chronic systolic heart failure - she had mild to moderate rise in troponin, not an ACS pattern, EKG was nonspecific, has been seen by cardiology, not a candidate for invasive testing or invasive intervention as per cardiology daughter.  She is currently on heparin drip which will be continued, I have switched her to Coreg, Imdur, hydralazine combination, placed on aspirin and statin for secondary prevention as.  We will also give trial of Maalox and PPI to rule out any GI source.  Continue to monitor.  2.  COVID-19 infection.  She has had 3 COVID vaccines, clinically no shortness of breath or hypoxia, stable CRP, no cough or shortness of breath.   Monitor closely.  This likely is incidental.  3.  ESRD.  MWF schedule renal on board  4.  Chronic combined systolic and diastolic heart failure EF has not dropped to 35% from 45% few years ago.  Not a candidate for  ACE/ARB due to underlying renal insufficiency, placed on combination of Coreg hydralazine and Imdur.  Fluid removal via dialysis.  5.  Essential hypertension.  Stop Norvasc switch to cardiac medications as a number for above  6.  Dyslipidemia.  On statin LDL is below 70.  7.  Right BKA.  Supportive care  8.  Anemia of chronic disease.  No acute issues monitor  9.  History of TIA.  Continue aspirin and statin for secondary prevention.  10.  DM type II.  On sliding scale.    Lab Results  Component Value Date   HGBA1C 5.9 (H) 09/15/2018    CBG (last 3)  Recent Labs    08/16/21 1700 08/16/21 2312 08/17/21 0857  GLUCAP 73 120* 96          Condition - Extremely Guarded  Family Communication  :  none present  Code Status :  Full  Consults  :  Renal, Cards  PUD Prophylaxis :    Procedures  :      TTE      Disposition Plan  :    Status is: Observation   DVT Prophylaxis  :  Hep Gtt     Lab Results  Component Value Date   PLT 172 08/17/2021    Diet :  Diet Order             Diet renal/carb modified with fluid restriction Diet-HS Snack? Nothing; Fluid restriction: 1200 mL Fluid; Room service appropriate? Yes; Fluid consistency: Thin  Diet effective now                    Inpatient Medications  Scheduled Meds:  acetaminophen  500 mg Oral QHS   alum & mag hydroxide-simeth  30 mL Oral TID AC   vitamin C  500 mg Oral Daily   aspirin  324 mg Oral Once   aspirin  81 mg Oral Daily   atorvastatin  80 mg Oral Daily   carvedilol  3.125 mg Oral BID WC   Chlorhexidine Gluconate Cloth  6 each Topical Q0600   cholecalciferol  1,000 Units Oral Daily   hydrALAZINE  10 mg Oral Q8H   insulin aspart  0-6 Units Subcutaneous TID WC   isosorbide  mononitrate  15 mg Oral Daily   molnupiravir EUA  4 capsule Oral BID   pantoprazole  40 mg Oral Daily   sevelamer carbonate  800 mg Oral TID WC   sodium chloride flush  3 mL Intravenous Q12H   ticagrelor  90 mg Oral BID   zinc sulfate  220 mg Oral Daily   Continuous Infusions:  sodium chloride     sodium chloride     sodium chloride     heparin 700 Units/hr (08/17/21 0113)   PRN Meds:.sodium chloride, sodium chloride, sodium chloride, acetaminophen **OR** acetaminophen, ALPRAZolam, alteplase, docusate sodium, guaiFENesin-dextromethorphan, heparin, lidocaine (PF), lidocaine-prilocaine, nitroGLYCERIN, ondansetron **OR** ondansetron (ZOFRAN) IV, oxyCODONE-acetaminophen, pentafluoroprop-tetrafluoroeth, sodium chloride flush  Antibiotics  :    Anti-infectives (From admission, onward)    Start     Dose/Rate Route Frequency Ordered Stop   08/16/21 2200  molnupiravir EUA (LAGEVRIO) capsule 800 mg        4 capsule Oral 2 times daily 08/16/21 1752 08/21/21 2159        Time Spent in minutes  30   [name] M.D on 08/17/2021 at 12:11 PM  To page go to [email or web address]   [facility] -  Office  [phone]  See  all Orders from today for further details    Objective:   Vitals:   08/17/21 0941 08/17/21 1000 08/17/21 1030 08/17/21 1100  BP: 106/77 104/63 110/60 115/68  Pulse: 80 90 90 97  Resp: (!) 21 (!) 21 20 (!) 21  Temp:      TempSrc:      SpO2:      Weight:      Height:        Wt Readings from Last 3 Encounters:  08/16/21 68.9 kg  11/10/20 68.9 kg  04/21/20 69.4 kg    No intake or output data in the 24 hours ending 08/17/21 1211   Physical Exam  Awake Alert, No new F.N deficits, Normal affect [place].AT,PERRAL Supple Neck, No JVD,   Symmetrical Chest wall movement, Good air movement bilaterally, CTAB RRR,No Gallops,Rubs or new Murmurs,  +ve B.Sounds, Abd Soft, No tenderness,   No Cyanosis, R.BKA   Data Review:    CBC Recent Labs  Lab  08/16/21 1026 08/16/21 2304 08/17/21 0645  WBC 4.6 5.6 4.4  HGB 10.5* 10.0* 10.2*  HCT 34.7* 33.6* 34.1*  PLT 171 165 172  MCV 100.0 99.4 98.8  MCH 30.3 29.6 29.6  MCHC 30.3 29.8* 29.9*  RDW 14.2 13.9 13.9  LYMPHSABS 0.9  --   --   MONOABS 0.8  --   --   EOSABS 0.0  --   --   BASOSABS 0.0  --   --     Electrolytes Recent Labs  Lab 08/16/21 0958 08/16/21 1026 08/16/21 2304 08/17/21 0645 08/17/21 0910  NA  --  137 137 137  --   K  --  4.7 4.6 4.5  --   CL  --  104 105 106  --   CO2  --  25 23 22   --   GLUCOSE  --  146* 122* 81  --   BUN  --  50* 56* 56*  --   CREATININE  --  7.74* 7.70* 7.70*  --   CALCIUM  --  8.6* 8.1* 8.2*  --   AST  --  21  --  21  --   ALT  --  14  --  13  --   ALKPHOS  --  72  --  60  --   BILITOT  --  0.6  --  0.6  --   ALBUMIN  --  2.9* 2.7* 2.8*  --   MG  --  2.0  --   --   --   CRP  --   --  3.5*  --  2.7*  DDIMER  --   --  1.16* 1.07*  --   INR  --  1.0  --   --   --   BNP 517.6*  --   --   --   --     ------------------------------------------------------------------------------------------------------------------ Recent Labs    08/17/21 0645  CHOL 136  HDL 48  LDLCALC 68  TRIG 101  CHOLHDL 2.8    Lab Results  Component Value Date   HGBA1C 5.9 (H) 09/15/2018    No results for input(s): TSH, T4TOTAL, T3FREE, THYROIDAB in the last 72 hours.  Invalid input(s): FREET3 ------------------------------------------------------------------------------------------------------------------ ID Labs Recent Labs  Lab 08/16/21 1026 08/16/21 2304 08/17/21 0645 08/17/21 0910  WBC 4.6 5.6 4.4  --   PLT 171 165 172  --   CRP  --  3.5*  --  2.7*  DDIMER  --  1.16*  1.07*  --   CREATININE 7.74* 7.70* 7.70*  --    Cardiac Enzymes No results for input(s): CKMB, TROPONINI, MYOGLOBIN in the last 168 hours.  Invalid input(s): CK   Radiology Reports DG Chest 1 View  Result Date: 08/16/2021 CLINICAL DATA:  Chest pain, shortness of  breath. EXAM: CHEST  1 VIEW COMPARISON:  February 27, 2019. FINDINGS: Stable cardiomediastinal silhouette. Both lungs are clear. The visualized skeletal structures are unremarkable. IMPRESSION: No active disease. Electronically Signed   By: Marijo Conception M.D.   On: 08/16/2021 10:27   DG Chest Port 1 View  Result Date: 08/17/2021 CLINICAL DATA:  70 year old female positive COVID-19. Shortness of breath. EXAM: PORTABLE CHEST 1 VIEW COMPARISON:  Portable chest 08/16/2021 and earlier. FINDINGS: Portable AP semi upright view at 0720 hours. Stable low lung volumes. Allowing for portable technique the lungs are clear. Normal cardiac size and mediastinal contours. Visualized tracheal air column is within normal limits. Chronic surgical clips projecting over the proximal humerus. No acute osseous abnormality identified. Negative visible bowel gas pattern. IMPRESSION: Stable low lung volumes with no acute cardiopulmonary abnormality. Electronically Signed   By: Genevie Ann M.D.   On: 08/17/2021 07:39   ECHOCARDIOGRAM COMPLETE  Result Date: 08/17/2021    ECHOCARDIOGRAM REPORT   Patient Name:   Jenna Brown Date of Exam: 08/17/2021 Medical Rec #:  [id number]     Height:       64.0 in Accession #:    [id number]    Weight:       152.0 lb Date of Birth:  08-24-1951    BSA:          1.741 m Patient Age:    3 years      BP:           142/65 mmHg Patient Gender: F             HR:           118 bpm. Exam Location:  Inpatient Procedure: 2D Echo, Cardiac Doppler, Color Doppler and Intracardiac            Opacification Agent Indications:     NSTEMI  History:         Patient has prior history of Echocardiogram examinations, most                  recent 08/01/2018. CAD; Risk Factors:Hypertension.  Sonographer:     Glo Herring Referring Phys:  [id number] Orma Flaming Diagnosing Phys: Eleonore Chiquito MD IMPRESSIONS  1. The anterolateral wall is akinetic. The mid and distal septum, entire apex, and mid inferoseptum are akinetic. All  other segments are hypokinetic. Findings consistent with multivessel CAD. No LV thrombus on contrast imaging. Left ventricular ejection  fraction, by estimation, is 20 to 25%. The left ventricle has severely decreased function. The left ventricle demonstrates regional wall motion abnormalities (see scoring diagram/findings for description). Indeterminate diastolic filling due to E-A fusion.  2. Right ventricular systolic function is normal. The right ventricular size is normal. Tricuspid regurgitation signal is inadequate for assessing PA pressure.  3. The mitral valve is degenerative. Trivial mitral valve regurgitation. No evidence of mitral stenosis.  4. Focal calcification of the LCC noted. The aortic valve is tricuspid. There is mild calcification of the aortic valve. Aortic valve regurgitation is not visualized. Aortic valve sclerosis/calcification is present, without any evidence of aortic stenosis.  5. The inferior vena cava is normal in size with greater than 50% respiratory variability,  suggesting right atrial pressure of 3 mmHg. Comparison(s): Changes from prior study are noted. EF now severely reduced 20-25%. Apical akinesis was present on prior study. Lateral akinesis now present with global hypokinesis in remaining segments. FINDINGS  Left Ventricle: The anterolateral wall is akinetic. The mid and distal septum, entire apex, and mid inferoseptum are akinetic. All other segments are hypokinetic. Findings consistent with multivessel CAD. No LV thrombus on contrast imaging. Left ventricular ejection fraction, by estimation, is 20 to 25%. The left ventricle has severely decreased function. The left ventricle demonstrates regional wall motion abnormalities. Definity contrast agent was given IV to delineate the left ventricular endocardial borders. The left ventricular internal cavity size was normal in size. There is no left ventricular hypertrophy. Indeterminate diastolic filling due to E-A fusion.  LV Wall  Scoring: The antero-lateral wall, mid and distal anterior septum, entire apex, and mid inferoseptal segment are akinetic. The anterior wall, inferior wall, posterior wall, basal anteroseptal segment, and basal inferoseptal segment are hypokinetic. Right Ventricle: The right ventricular size is normal. No increase in right ventricular wall thickness. Right ventricular systolic function is normal. Tricuspid regurgitation signal is inadequate for assessing PA pressure. Left Atrium: Left atrial size was normal in size. Right Atrium: Right atrial size was normal in size. Pericardium: Trivial pericardial effusion is present. Mitral Valve: The mitral valve is degenerative in appearance. Mild mitral annular calcification. Trivial mitral valve regurgitation. No evidence of mitral valve stenosis. Tricuspid Valve: The tricuspid valve is grossly normal. Tricuspid valve regurgitation is not demonstrated. No evidence of tricuspid stenosis. Aortic Valve: Focal calcification of the LCC noted. The aortic valve is tricuspid. There is mild calcification of the aortic valve. Aortic valve regurgitation is not visualized. Aortic valve sclerosis/calcification is present, without any evidence of aortic stenosis. Aortic valve mean gradient measures 4.0 mmHg. Aortic valve peak gradient measures 6.2 mmHg. Aortic valve area, by VTI measures 2.32 cm. Pulmonic Valve: The pulmonic valve was grossly normal. Pulmonic valve regurgitation is not visualized. No evidence of pulmonic stenosis. Aorta: The aortic root and ascending aorta are structurally normal, with no evidence of dilitation. Venous: The inferior vena cava is normal in size with greater than 50% respiratory variability, suggesting right atrial pressure of 3 mmHg. IAS/Shunts: The atrial septum is grossly normal.  LEFT VENTRICLE PLAX 2D LVIDd:         4.50 cm LVIDs:         3.90 cm LV PW:         1.10 cm LV IVS:        1.10 cm LVOT diam:     2.00 cm LV SV:         51 LV SV Index:   29  LVOT Area:     3.14 cm  LV Volumes (MOD) LV vol d, MOD A2C: 158.0 ml LV vol d, MOD A4C: 178.0 ml LV vol s, MOD A2C: 111.0 ml LV vol s, MOD A4C: 127.0 ml LV SV MOD A2C:     47.0 ml LV SV MOD A4C:     178.0 ml LV SV MOD BP:      47.9 ml RIGHT VENTRICLE          IVC RV Basal diam:  3.50 cm  IVC diam: 1.50 cm LEFT ATRIUM           Index        RIGHT ATRIUM          Index LA diam:      4.60  cm 2.64 cm/m   RA Area:     8.55 cm LA Vol (A2C): 42.5 ml 24.41 ml/m  RA Volume:   16.10 ml 9.25 ml/m LA Vol (A4C): 47.6 ml 27.34 ml/m  AORTIC VALVE                    PULMONIC VALVE AV Area (Vmax):    2.05 cm     PV Vmax:       1.09 m/s AV Area (Vmean):   1.82 cm     PV Peak grad:  4.8 mmHg AV Area (VTI):     2.32 cm AV Vmax:           124.00 cm/s AV Vmean:          93.200 cm/s AV VTI:            0.219 m AV Peak Grad:      6.2 mmHg AV Mean Grad:      4.0 mmHg LVOT Vmax:         80.80 cm/s LVOT Vmean:        53.900 cm/s LVOT VTI:          0.162 m LVOT/AV VTI ratio: 0.74  AORTA Ao Root diam: 3.30 cm Ao Asc diam:  3.00 cm  SHUNTS Systemic VTI:  0.16 m Systemic Diam: 2.00 cm Eleonore Chiquito MD Electronically signed by Eleonore Chiquito MD Signature Date/Time: 08/17/2021/9:44:58 AM    Final (Updated)

## 2021-08-17 NOTE — Progress Notes (Signed)
[facility] NEPHROLOGY PROGRESS NOTE  Assessment/ Plan: Pt is a 70 y.o. yo female  with history of hypertension, DM, HLD, CAD, peripheral vascular disease status post right AKA, anemia, arthritis, ESRD on HD MWF presented with chest pain and shortness of breath.  OP HD orders: NEPHROLOGIST: Cay Schillings MD, [facility], MWF, HD TIME: 210, 2K.   # Chest pain/NSTEMI: Seen by cardiologist.  Given diffuse CAD she is at high risk for coronary event.  Not a candidate for bypass surgery therefore trying to maximize medical therapy.  She is currently on heparin.  Chest pain-free.  Chest x-ray negative.Echo with EF of 20 to 25%, severely depressed LV function.   # ESRD: MWF: [facility].  Off schedule dialysis today.  UF only 800 cc.  It seems like she has some residual kidney function.  Plan for another HD tomorrow to continue her schedule.  # Hypertension: Blood pressure variable.  Monitor blood pressure on cardiac medication.  Volume acceptable.    # Anemia of ESRD: Hemoglobin at goal.  We will try to obtain outpatient record.   # Metabolic Bone Disease: Corrected calcium and phosphorus level acceptable.  Continue Renvela.  #COVID-positive: Probably incidental finding.  On Molnupiravir chest x-ray negative.  Subjective: Seen and examined in dialysis unit with COVID precaution.  She had low BP during HD and her symptoms.  Denies nausea vomiting chest pain shortness of breath. Objective Vital signs in last 24 hours: Vitals:   08/17/21 1100 08/17/21 1130 08/17/21 1200 08/17/21 1230  BP: 115/68 106/85 (!) 87/61 114/90  Pulse: 97 98 100 99  Resp: (!) 21 (!) 23 16 (!) 23  Temp:      TempSrc:      SpO2:      Weight:      Height:       Weight change:  No intake or output data in the 24 hours ending 08/17/21 1345     Labs: Basic Metabolic Panel: Recent Labs  Lab 08/16/21 1026 08/16/21 2304 08/17/21 0645  NA 137 137 137  K 4.7 4.6 4.5  CL 104 105 106  CO2 25 23  22   GLUCOSE 146* 122* 81  BUN 50* 56* 56*  CREATININE 7.74* 7.70* 7.70*  CALCIUM 8.6* 8.1* 8.2*  PHOS  --  3.7  --    Liver Function Tests: Recent Labs  Lab 08/16/21 1026 08/16/21 2304 08/17/21 0645  AST 21  --  21  ALT 14  --  13  ALKPHOS 72  --  60  BILITOT 0.6  --  0.6  PROT 6.1*  --  5.7*  ALBUMIN 2.9* 2.7* 2.8*   No results for input(s): LIPASE, AMYLASE in the last 168 hours. No results for input(s): AMMONIA in the last 168 hours. CBC: Recent Labs  Lab 08/16/21 1026 08/16/21 2304 08/17/21 0645  WBC 4.6 5.6 4.4  NEUTROABS 2.9  --   --   HGB 10.5* 10.0* 10.2*  HCT 34.7* 33.6* 34.1*  MCV 100.0 99.4 98.8  PLT 171 165 172   Cardiac Enzymes: No results for input(s): CKTOTAL, CKMB, CKMBINDEX, TROPONINI in the last 168 hours. CBG: Recent Labs  Lab 08/16/21 1008 08/16/21 1700 08/16/21 2312 08/17/21 0857  GLUCAP 149* 73 120* 96    Iron Studies:  Recent Labs    08/16/21 2304  FERRITIN 1,376*   Studies/Results: DG Chest 1 View  Result Date: 08/16/2021 CLINICAL DATA:  Chest pain, shortness of breath. EXAM: CHEST  1 VIEW COMPARISON:  February 27, 2019. FINDINGS: Stable cardiomediastinal silhouette. Both lungs are clear. The visualized skeletal structures are unremarkable. IMPRESSION: No active disease. Electronically Signed   By: Marijo Conception M.D.   On: 08/16/2021 10:27   DG Chest Port 1 View  Result Date: 08/17/2021 CLINICAL DATA:  70 year old female positive COVID-19. Shortness of breath. EXAM: PORTABLE CHEST 1 VIEW COMPARISON:  Portable chest 08/16/2021 and earlier. FINDINGS: Portable AP semi upright view at 0720 hours. Stable low lung volumes. Allowing for portable technique the lungs are clear. Normal cardiac size and mediastinal contours. Visualized tracheal air column is within normal limits. Chronic surgical clips projecting over the proximal humerus. No acute osseous abnormality identified. Negative visible bowel gas pattern. IMPRESSION: Stable low lung  volumes with no acute cardiopulmonary abnormality. Electronically Signed   By: Genevie Ann M.D.   On: 08/17/2021 07:39   ECHOCARDIOGRAM COMPLETE  Result Date: 08/17/2021    ECHOCARDIOGRAM REPORT   Patient Name:   Jenna Brown Date of Exam: 08/17/2021 Medical Rec #:  [id number]     Height:       64.0 in Accession #:    [id number]    Weight:       152.0 lb Date of Birth:  [birth year]    BSA:          1.741 m Patient Age:    78 years      BP:           142/65 mmHg Patient Gender: F             HR:           118 bpm. Exam Location:  Inpatient Procedure: 2D Echo, Cardiac Doppler, Color Doppler and Intracardiac            Opacification Agent Indications:     NSTEMI  History:         Patient has prior history of Echocardiogram examinations, most                  recent 08/01/2018. CAD; Risk Factors:Hypertension.  Sonographer:     Glo Herring Referring Phys:  [id number] Orma Flaming Diagnosing Phys: Eleonore Chiquito MD IMPRESSIONS  1. The anterolateral wall is akinetic. The mid and distal septum, entire apex, and mid inferoseptum are akinetic. All other segments are hypokinetic. Findings consistent with multivessel CAD. No LV thrombus on contrast imaging. Left ventricular ejection  fraction, by estimation, is 20 to 25%. The left ventricle has severely decreased function. The left ventricle demonstrates regional wall motion abnormalities (see scoring diagram/findings for description). Indeterminate diastolic filling due to E-A fusion.  2. Right ventricular systolic function is normal. The right ventricular size is normal. Tricuspid regurgitation signal is inadequate for assessing PA pressure.  3. The mitral valve is degenerative. Trivial mitral valve regurgitation. No evidence of mitral stenosis.  4. Focal calcification of the LCC noted. The aortic valve is tricuspid. There is mild calcification of the aortic valve. Aortic valve regurgitation is not visualized. Aortic valve sclerosis/calcification is present, without any  evidence of aortic stenosis.  5. The inferior vena cava is normal in size with greater than 50% respiratory variability, suggesting right atrial pressure of 3 mmHg. Comparison(s): Changes from prior study are noted. EF now severely reduced 20-25%. Apical akinesis was present on prior study. Lateral akinesis now present with global hypokinesis in remaining segments. FINDINGS  Left Ventricle: The anterolateral wall is akinetic. The mid and distal septum, entire apex, and mid inferoseptum are akinetic. All other  segments are hypokinetic. Findings consistent with multivessel CAD. No LV thrombus on contrast imaging. Left ventricular ejection fraction, by estimation, is 20 to 25%. The left ventricle has severely decreased function. The left ventricle demonstrates regional wall motion abnormalities. Definity contrast agent was given IV to delineate the left ventricular endocardial borders. The left ventricular internal cavity size was normal in size. There is no left ventricular hypertrophy. Indeterminate diastolic filling due to E-A fusion.  LV Wall Scoring: The antero-lateral wall, mid and distal anterior septum, entire apex, and mid inferoseptal segment are akinetic. The anterior wall, inferior wall, posterior wall, basal anteroseptal segment, and basal inferoseptal segment are hypokinetic. Right Ventricle: The right ventricular size is normal. No increase in right ventricular wall thickness. Right ventricular systolic function is normal. Tricuspid regurgitation signal is inadequate for assessing PA pressure. Left Atrium: Left atrial size was normal in size. Right Atrium: Right atrial size was normal in size. Pericardium: Trivial pericardial effusion is present. Mitral Valve: The mitral valve is degenerative in appearance. Mild mitral annular calcification. Trivial mitral valve regurgitation. No evidence of mitral valve stenosis. Tricuspid Valve: The tricuspid valve is grossly normal. Tricuspid valve regurgitation is  not demonstrated. No evidence of tricuspid stenosis. Aortic Valve: Focal calcification of the LCC noted. The aortic valve is tricuspid. There is mild calcification of the aortic valve. Aortic valve regurgitation is not visualized. Aortic valve sclerosis/calcification is present, without any evidence of aortic stenosis. Aortic valve mean gradient measures 4.0 mmHg. Aortic valve peak gradient measures 6.2 mmHg. Aortic valve area, by VTI measures 2.32 cm. Pulmonic Valve: The pulmonic valve was grossly normal. Pulmonic valve regurgitation is not visualized. No evidence of pulmonic stenosis. Aorta: The aortic root and ascending aorta are structurally normal, with no evidence of dilitation. Venous: The inferior vena cava is normal in size with greater than 50% respiratory variability, suggesting right atrial pressure of 3 mmHg. IAS/Shunts: The atrial septum is grossly normal.  LEFT VENTRICLE PLAX 2D LVIDd:         4.50 cm LVIDs:         3.90 cm LV PW:         1.10 cm LV IVS:        1.10 cm LVOT diam:     2.00 cm LV SV:         51 LV SV Index:   29 LVOT Area:     3.14 cm  LV Volumes (MOD) LV vol d, MOD A2C: 158.0 ml LV vol d, MOD A4C: 178.0 ml LV vol s, MOD A2C: 111.0 ml LV vol s, MOD A4C: 127.0 ml LV SV MOD A2C:     47.0 ml LV SV MOD A4C:     178.0 ml LV SV MOD BP:      47.9 ml RIGHT VENTRICLE          IVC RV Basal diam:  3.50 cm  IVC diam: 1.50 cm LEFT ATRIUM           Index        RIGHT ATRIUM          Index LA diam:      4.60 cm 2.64 cm/m   RA Area:     8.55 cm LA Vol (A2C): 42.5 ml 24.41 ml/m  RA Volume:   16.10 ml 9.25 ml/m LA Vol (A4C): 47.6 ml 27.34 ml/m  AORTIC VALVE                    PULMONIC VALVE  AV Area (Vmax):    2.05 cm     PV Vmax:       1.09 m/s AV Area (Vmean):   1.82 cm     PV Peak grad:  4.8 mmHg AV Area (VTI):     2.32 cm AV Vmax:           124.00 cm/s AV Vmean:          93.200 cm/s AV VTI:            0.219 m AV Peak Grad:      6.2 mmHg AV Mean Grad:      4.0 mmHg LVOT Vmax:         80.80  cm/s LVOT Vmean:        53.900 cm/s LVOT VTI:          0.162 m LVOT/AV VTI ratio: 0.74  AORTA Ao Root diam: 3.30 cm Ao Asc diam:  3.00 cm  SHUNTS Systemic VTI:  0.16 m Systemic Diam: 2.00 cm Eleonore Chiquito MD Electronically signed by Eleonore Chiquito MD Signature Date/Time: 08/17/2021/9:44:58 AM    Final (Updated)     Medications: Infusions:  sodium chloride     sodium chloride     sodium chloride     heparin 700 Units/hr (08/17/21 0113)    Scheduled Medications:  acetaminophen  500 mg Oral QHS   alum & mag hydroxide-simeth  30 mL Oral TID AC   vitamin C  500 mg Oral Daily   aspirin  324 mg Oral Once   aspirin  81 mg Oral Daily   atorvastatin  80 mg Oral Daily   carvedilol  3.125 mg Oral BID WC   Chlorhexidine Gluconate Cloth  6 each Topical Q0600   cholecalciferol  1,000 Units Oral Daily   hydrALAZINE  10 mg Oral Q8H   insulin aspart  0-6 Units Subcutaneous TID WC   isosorbide mononitrate  15 mg Oral Daily   molnupiravir EUA  4 capsule Oral BID   pantoprazole  40 mg Oral Daily   sevelamer carbonate  800 mg Oral TID WC   sodium chloride flush  3 mL Intravenous Q12H   ticagrelor  90 mg Oral BID   zinc sulfate  220 mg Oral Daily    have reviewed scheduled and prn medications.  Physical Exam: General:NAD, comfortable Heart:RRR, s1s2 nl Lungs:clear b/l, no crackle Abdomen:soft, Non-tender, non-distended Extremities: Right AKA, no edema Dialysis Access: Right upper extremity AV fistula has good thrill and bruit.  Earlean Fidalgo Prasad Kamden Reber 08/17/2021,1:45 PM  LOS: 0 days

## 2021-08-17 NOTE — ED Notes (Signed)
Placed on bedpan, changed chux

## 2021-08-17 NOTE — Progress Notes (Signed)
ANTICOAGULATION CONSULT NOTE  Pharmacy Consult for heparin Indication: chest pain/ACS  Allergies  Allergen Reactions   Plavix [Clopidogrel Bisulfate] Itching   Codeine Other (See Comments)    "makes me feel strange"   Losartan Other (See Comments)   Tylenol With Codeine #3 [Acetaminophen-Codeine] Other (See Comments)    "doesn't make me feel right"    Patient Measurements: Height: 5\' 4"  (162.6 cm) Weight: 68.9 kg (152 lb) IBW/kg (Calculated) : 54.7 Heparin Dosing Weight: TBW  Vital Signs: Temp: 98.3 F (36.8 C) (11/28 2315) Temp Source: Oral (11/28 2315) BP: 94/67 (11/29 0700) Pulse Rate: 114 (11/29 0700)  Labs: Recent Labs    08/16/21 1026 08/16/21 1158 08/16/21 2304 08/17/21 0645 08/17/21 0910  HGB 10.5*  --  10.0* 10.2*  --   HCT 34.7*  --  33.6* 34.1*  --   PLT 171  --  165 172  --   LABPROT 13.0  --   --   --   --   INR 1.0  --   --   --   --   HEPARINUNFRC  --   --  0.90*  --  0.48  CREATININE 7.74*  --  7.70* 7.70*  --   TROPONINIHS 424* 381*  --   --   --      Estimated Creatinine Clearance: 6.7 mL/min (A) (by C-G formula based on SCr of 7.7 mg/dL (H)).  Assessment: 58 YOF presenting with CP,SOB, elevated troponin and found to have NSTEMI. No PTA anticoagulation. Pharmacy consulted to start heparin.  Current rate: heparin 700 units/hr  Heparin level: 0.48, therapeutic  No issues noted with infusion. No overt bleeding  Goal of Therapy:  Heparin level 0.3-0.7 units/ml Monitor platelets by anticoagulation protocol: Yes   Plan:  Continue heparin 700 units/hr F/u 8 hr heparin level Daily CBC and heparin level.  Thank you for allowing pharmacy to participate in this patient's care.  Levonne Spiller, PharmD PGY1 Acute Care Resident  08/17/2021,10:03 AM

## 2021-08-17 NOTE — Progress Notes (Signed)
PT Cancellation Note  Patient Details Name: Jenna Brown MRN: [id number] DOB: 1950-12-14   Cancelled Treatment:    Reason Eval/Treat Not Completed: Patient at procedure or test/unavailable.  Retry as time and pt allow.   Ramond Dial 08/17/2021, 9:33 AM  Mee Hives, PT PhD Acute Rehab Dept. Number: [place]

## 2021-08-17 NOTE — ED Notes (Signed)
Documentation by hemodialysis that this RN received report at 1410 from them. Documentation incorrect due to this RN shift not started and not on hospital campus at that time. Pt in ED before this RN arrived. I obtained report from [place], [place].

## 2021-08-17 NOTE — Progress Notes (Signed)
OT Cancellation Note  Patient Details Name: Jenna Brown MRN: [id number] DOB: 28-Apr-1951   Cancelled Treatment:    Reason Eval/Treat Not Completed: Patient at procedure or test/ unavailable. Will follow up as able.  Helane Gunther, OT/L  [facility] [place] 08/17/2021, 1:49 PM

## 2021-08-17 NOTE — ED Notes (Signed)
Breakfast order placed ?

## 2021-08-18 DIAGNOSIS — I5042 Chronic combined systolic (congestive) and diastolic (congestive) heart failure: Secondary | ICD-10-CM

## 2021-08-18 DIAGNOSIS — Z992 Dependence on renal dialysis: Secondary | ICD-10-CM | POA: Diagnosis not present

## 2021-08-18 DIAGNOSIS — R778 Other specified abnormalities of plasma proteins: Secondary | ICD-10-CM

## 2021-08-18 DIAGNOSIS — N186 End stage renal disease: Secondary | ICD-10-CM

## 2021-08-18 DIAGNOSIS — I739 Peripheral vascular disease, unspecified: Secondary | ICD-10-CM

## 2021-08-18 LAB — D-DIMER, QUANTITATIVE: D-Dimer, Quant: 0.87 ug/mL-FEU — ABNORMAL HIGH (ref 0.00–0.50)

## 2021-08-18 LAB — GLUCOSE, CAPILLARY
Glucose-Capillary: 140 mg/dL — ABNORMAL HIGH (ref 70–99)
Glucose-Capillary: 81 mg/dL (ref 70–99)
Glucose-Capillary: 166 mg/dL — ABNORMAL HIGH (ref 70–99)
Glucose-Capillary: 143 mg/dL — ABNORMAL HIGH (ref 70–99)

## 2021-08-18 LAB — CBC
HCT: 30.4 % — ABNORMAL LOW (ref 36.0–46.0)
HCT: 30.2 % — ABNORMAL LOW (ref 36.0–46.0)
Hemoglobin: 9.4 g/dL — ABNORMAL LOW (ref 12.0–15.0)
Hemoglobin: 9.2 g/dL — ABNORMAL LOW (ref 12.0–15.0)
MCH: 29.9 pg (ref 26.0–34.0)
MCH: 29.5 pg (ref 26.0–34.0)
MCHC: 30.9 g/dL (ref 30.0–36.0)
MCHC: 30.5 g/dL (ref 30.0–36.0)
MCV: 96.8 fL (ref 80.0–100.0)
MCV: 96.8 fL (ref 80.0–100.0)
Platelets: 129 10*3/uL — ABNORMAL LOW (ref 150–400)
Platelets: 144 10*3/uL — ABNORMAL LOW (ref 150–400)
RBC: 3.14 MIL/uL — ABNORMAL LOW (ref 3.87–5.11)
RBC: 3.12 MIL/uL — ABNORMAL LOW (ref 3.87–5.11)
RDW: 13.6 % (ref 11.5–15.5)
RDW: 13.7 % (ref 11.5–15.5)
WBC: 5.6 10*3/uL (ref 4.0–10.5)
WBC: 6 10*3/uL (ref 4.0–10.5)
nRBC: 0 % (ref 0.0–0.2)
nRBC: 0 % (ref 0.0–0.2)

## 2021-08-18 LAB — HEPATITIS B SURFACE ANTIBODY, QUANTITATIVE
Hep B S AB Quant (Post): <3.1 m[IU]/mL — ABNORMAL LOW (ref >9.9)
Hep B S AB Quant (Post): 3.5 m[IU]/mL — ABNORMAL LOW (ref >9.9)

## 2021-08-18 LAB — COMPREHENSIVE METABOLIC PANEL
ALT: 13 U/L (ref 0–44)
AST: 22 U/L (ref 15–41)
Albumin: 2.7 g/dL — ABNORMAL LOW (ref 3.5–5.0)
Alkaline Phosphatase: 62 U/L (ref 38–126)
Anion gap: 8 (ref 5–15)
BUN: 24 mg/dL — ABNORMAL HIGH (ref 8–23)
CO2: 27 mmol/L (ref 22–32)
Calcium: 8 mg/dL — ABNORMAL LOW (ref 8.9–10.3)
Chloride: 100 mmol/L (ref 98–111)
Creatinine, Ser: 4.95 mg/dL — ABNORMAL HIGH (ref 0.44–1.00)
GFR, Estimated: 9 mL/min — ABNORMAL LOW (ref >60)
Glucose, Bld: 145 mg/dL — ABNORMAL HIGH (ref 70–99)
Potassium: 3.8 mmol/L (ref 3.5–5.1)
Sodium: 135 mmol/L (ref 135–145)
Total Bilirubin: 0.7 mg/dL (ref 0.3–1.2)
Total Protein: 5.7 g/dL — ABNORMAL LOW (ref 6.5–8.1)

## 2021-08-18 LAB — IRON AND TIBC
Iron: 113 ug/dL (ref 28–170)
Saturation Ratios: 52 % — ABNORMAL HIGH (ref 10.4–31.8)
TIBC: 218 ug/dL — ABNORMAL LOW (ref 250–450)
UIBC: 105 ug/dL

## 2021-08-18 LAB — HEPARIN LEVEL (UNFRACTIONATED): Heparin Unfractionated: 0.18 IU/mL — ABNORMAL LOW (ref 0.30–0.70)

## 2021-08-18 LAB — C-REACTIVE PROTEIN: CRP: 2.1 mg/dL — ABNORMAL HIGH (ref <1.0)

## 2021-08-18 LAB — FERRITIN: Ferritin: 1373 ng/mL — ABNORMAL HIGH (ref 11–307)

## 2021-08-18 MED ORDER — SODIUM CHLORIDE 0.9 % IV SOLN: 100.0000 mL | INTRAVENOUS | Status: DC | PRN

## 2021-08-18 MED ORDER — ALTEPLASE 2 MG IJ SOLR
2.0000 mg | Freq: Once | INTRAMUSCULAR | Status: DC | PRN
  Filled 2021-08-18: qty 2

## 2021-08-18 MED ORDER — PENTAFLUOROPROP-TETRAFLUOROETH EX AERO: 1.0000 "application " | INHALATION_SPRAY | CUTANEOUS | Status: DC | PRN

## 2021-08-18 MED ORDER — HEPARIN SODIUM (PORCINE) 1000 UNIT/ML DIALYSIS
1000.0000 [IU] | INTRAMUSCULAR | Status: DC | PRN
  Filled 2021-08-18: qty 1

## 2021-08-18 MED ORDER — HEPARIN SODIUM (PORCINE) 5000 UNIT/ML IJ SOLN
5000.0000 [IU] | Freq: Three times a day (TID) | INTRAMUSCULAR | Status: DC
  Administered 2021-08-18 – 2021-08-19 (×2): 5000 [IU] via SUBCUTANEOUS
  Filled 2021-08-18 (×3): qty 1

## 2021-08-18 MED ORDER — LIDOCAINE-PRILOCAINE 2.5-2.5 % EX CREA
1.0000 "application " | TOPICAL_CREAM | CUTANEOUS | Status: DC | PRN
  Filled 2021-08-18: qty 5

## 2021-08-18 MED ORDER — LIDOCAINE HCL (PF) 1 % IJ SOLN: 5.0000 mL | INTRAMUSCULAR | Status: DC | PRN

## 2021-08-18 NOTE — Evaluation (Signed)
Occupational Therapy Evaluation Patient Details Name: Jenna Brown MRN: [id number] DOB: December 04, 1950 Today's Date: 08/18/2021   History of Present Illness Pt is a 70 year old woman admitted on 11/28  with nausea, chest pain and diaphoresis--possible NSTEMI in the setting of diffuse, longstanding CAD. Pt also COVID positive. PMH: ESRD with MWF HD, PAD s/p R AKA, L LE SFA popliteal artery stent, HTN, DM2, CHF, TIA, chronic UTI, anxiety, B12 and Vit D deficiency.   Clinical Impression   Pt was functioning from a w/c level, able to propel independently. Her sister assists with shower transfers, but pt can bathe, dress and toilet herself. Pt demonstrated ability to transfer with supervision and self fed, groomed and toileted with set up. Pt is likely near her baseline, but OT will follow to ensure she continues to maintain her independence.      Recommendations for follow up therapy are one component of a multi-disciplinary discharge planning process, led by the attending physician.  Recommendations may be updated based on patient status, additional functional criteria and insurance authorization.   Follow Up Recommendations  No OT follow up    Assistance Recommended at Discharge Intermittent Supervision/Assistance  Functional Status Assessment  Patient has had a recent decline in their functional status and demonstrates the ability to make significant improvements in function in a reasonable and predictable amount of time.  Equipment Recommendations  None recommended by OT    Recommendations for Other Services       Precautions / Restrictions Precautions Precautions: Fall      Mobility Bed Mobility               General bed mobility comments: seated at EOB upon arrival    Transfers Overall transfer level: Needs assistance Equipment used: None Transfers: Bed to chair/wheelchair/BSC     Squat pivot transfers: Supervision              Balance Overall balance  assessment: Needs assistance   Sitting balance-Leahy Scale: Good                                     ADL either performed or assessed with clinical judgement   ADL Overall ADL's : Needs assistance/impaired Eating/Feeding: Set up;Sitting Eating/Feeding Details (indicate cue type and reason): assist to open containers Grooming: Wash/dry hands;Sitting;Set up   Upper Body Bathing: Set up;Sitting   Lower Body Bathing: Set up;Sitting/lateral leans   Upper Body Dressing : Set up;Sitting   Lower Body Dressing: Sitting/lateral leans;Maximal assistance Lower Body Dressing Details (indicate cue type and reason): assisted for sock, near losing a toenail Toilet Transfer: Supervision/safety;Squat-pivot;BSC/3in1   Toileting- Clothing Manipulation and Hygiene: Set up;Sitting/lateral lean               Vision Ability to See in Adequate Light: 0 Adequate Patient Visual Report: No change from baseline       Perception     Praxis      Pertinent Vitals/Pain Pain Assessment: No/denies pain     Hand Dominance Right   Extremity/Trunk Assessment Upper Extremity Assessment Upper Extremity Assessment: Overall WFL for tasks assessed   Lower Extremity Assessment Lower Extremity Assessment: Defer to PT evaluation   Cervical / Trunk Assessment Cervical / Trunk Assessment: Normal   Communication Communication Communication: No difficulties   Cognition Arousal/Alertness: Awake/alert Behavior During Therapy: WFL for tasks assessed/performed Overall Cognitive Status: Within Functional Limits for tasks assessed  General Comments       Exercises     Shoulder Instructions      Home Living Family/patient expects to be discharged to:: Private residence Living Arrangements: Parent;Other relatives (sister and mother) Available Help at Discharge: Family;Available 24 hours/day Type of Home: House       Home  Layout: One level     Bathroom Shower/Tub: Walk-in shower         Home Equipment: Shower seat;Wheelchair - manual          Prior Functioning/Environment Prior Level of Function : Needs assist             Mobility Comments: transfers and propels manual w/c independently ADLs Comments: sister assists with shower tranfers and IADL        OT Problem List: Impaired balance (sitting and/or standing)      OT Treatment/Interventions: Self-care/ADL training;Therapeutic activities    OT Goals(Current goals can be found in the care plan section) Acute Rehab OT Goals OT Goal Formulation: With patient Time For Goal Achievement: 09/01/21 Potential to Achieve Goals: Good ADL Goals Additional ADL Goal #1: Pt will perform ADL and ADL tranfers with set up to supervision, pt with possible impending L LE vascular intervention.  OT Frequency: Min 2X/week   Barriers to D/C:            Co-evaluation              AM-PAC OT "6 Clicks" Daily Activity     Outcome Measure Help from another person eating meals?: A Little Help from another person taking care of personal grooming?: A Little Help from another person toileting, which includes using toliet, bedpan, or urinal?: A Little Help from another person bathing (including washing, rinsing, drying)?: A Little Help from another person to put on and taking off regular upper body clothing?: A Little Help from another person to put on and taking off regular lower body clothing?: A Little 6 Click Score: 18   End of Session Nurse Communication: Mobility status  Activity Tolerance: Patient tolerated treatment well Patient left: in chair;with call bell/phone within reach;with chair alarm set  OT Visit Diagnosis: Muscle weakness (generalized) (M62.81)                Time: 3817-7116 OT Time Calculation (min): 15 min Charges:  OT General Charges $OT Visit: 1 Visit OT Evaluation $OT Eval Low Complexity: 1 Low  Jenna Brown,  OTR/L Acute Rehabilitation Services Pager: [phone] Office: [phone]  Malka So 08/18/2021, 3:37 PM

## 2021-08-18 NOTE — Progress Notes (Signed)
[facility] NEPHROLOGY PROGRESS NOTE  Assessment/ Plan: Pt is a 70 y.o. yo female  with history of hypertension, DM, HLD, CAD, peripheral vascular disease status post right AKA, anemia, arthritis, ESRD on HD MWF presented with chest pain and shortness of breath.  OP HD orders: NEPHROLOGIST: Cay Schillings MD, [facility], MWF, HD TIME: 210, 2K.   # Chest pain/NSTEMI: Seen by cardiologist.  Given diffuse CAD she is at high risk for coronary event.  Not a candidate for bypass surgery therefore trying to maximize medical therapy.  She is currently on heparin.  Chest pain-free.  Chest x-ray negative.Echo with EF of 20 to 25%, severely depressed LV function.  Plan for cardiology.   # ESRD: MWF: [facility].  Received dialysis off schedule yesterday with around 800 cc UF.  We will plan for regular dialysis today.  Patient wants to do only 3 hours.  Volume status looks acceptable.    # Hypertension: Blood pressure variable.  Monitor blood pressure on cardiac medication.  Volume acceptable.    # Anemia of ESRD: Hemoglobin at goal on admission.  Monitor hemoglobin.  Check iron studies.  # Metabolic Bone Disease: Corrected calcium and phosphorus level acceptable.  Continue Renvela.  #COVID-positive: Probably incidental finding.  On Molnupiravir chest x-ray negative.  Subjective: Seen and examined..  Denies nausea, vomiting, chest pain, shortness of breath.  Requesting to do dialysis only for 3 hours. Objective Vital signs in last 24 hours: Vitals:   08/18/21 0352 08/18/21 0355 08/18/21 0647 08/18/21 0700  BP:  (!) 109/53 120/61   Pulse:  66 76   Resp:  18 (!) 25   Temp:  97.9 F (36.6 C) 98.6 F (37 C)   TempSrc:  Oral  Oral  SpO2:  95% 99%   Weight: 69.3 kg     Height:       Weight change: 0.353 kg  Intake/Output Summary (Last 24 hours) at 08/18/2021 0855 Last data filed at 08/17/2021 2059 Gross per 24 hour  Intake 492.59 ml  Output 1021 ml  Net -528.41 ml        Labs: Basic Metabolic Panel: Recent Labs  Lab 08/16/21 2304 08/17/21 0645 08/18/21 0108  NA 137 137 135  K 4.6 4.5 3.8  CL 105 106 100  CO2 23 22 27   GLUCOSE 122* 81 145*  BUN 56* 56* 24*  CREATININE 7.70* 7.70* 4.95*  CALCIUM 8.1* 8.2* 8.0*  PHOS 3.7  --   --     Liver Function Tests: Recent Labs  Lab 08/16/21 1026 08/16/21 2304 08/17/21 0645 08/18/21 0108  AST 21  --  21 22  ALT 14  --  13 13  ALKPHOS 72  --  60 62  BILITOT 0.6  --  0.6 0.7  PROT 6.1*  --  5.7* 5.7*  ALBUMIN 2.9* 2.7* 2.8* 2.7*    No results for input(s): LIPASE, AMYLASE in the last 168 hours. No results for input(s): AMMONIA in the last 168 hours. CBC: Recent Labs  Lab 08/16/21 1026 08/16/21 2304 08/17/21 0645 08/18/21 0108  WBC 4.6 5.6 4.4 5.6  NEUTROABS 2.9  --   --   --   HGB 10.5* 10.0* 10.2* 9.4*  HCT 34.7* 33.6* 34.1* 30.4*  MCV 100.0 99.4 98.8 96.8  PLT 171 165 172 129*    Cardiac Enzymes: No results for input(s): CKTOTAL, CKMB, CKMBINDEX, TROPONINI in the last 168 hours. CBG: Recent Labs  Lab 08/16/21 2312 08/17/21 0857 08/17/21 1727 08/17/21  2213 08/18/21 0615  GLUCAP 120* 96 83 161* 140*     Iron Studies:  Recent Labs    08/17/21 0910  FERRITIN 1,270*    Studies/Results: DG Chest 1 View  Result Date: 08/16/2021 CLINICAL DATA:  Chest pain, shortness of breath. EXAM: CHEST  1 VIEW COMPARISON:  February 27, 2019. FINDINGS: Stable cardiomediastinal silhouette. Both lungs are clear. The visualized skeletal structures are unremarkable. IMPRESSION: No active disease. Electronically Signed   By: Marijo Conception M.D.   On: 08/16/2021 10:27   DG Chest Port 1 View  Result Date: 08/17/2021 CLINICAL DATA:  70 year old female positive COVID-19. Shortness of breath. EXAM: PORTABLE CHEST 1 VIEW COMPARISON:  Portable chest 08/16/2021 and earlier. FINDINGS: Portable AP semi upright view at 0720 hours. Stable low lung volumes. Allowing for portable technique the lungs  are clear. Normal cardiac size and mediastinal contours. Visualized tracheal air column is within normal limits. Chronic surgical clips projecting over the proximal humerus. No acute osseous abnormality identified. Negative visible bowel gas pattern. IMPRESSION: Stable low lung volumes with no acute cardiopulmonary abnormality. Electronically Signed   By: Genevie Ann M.D.   On: 08/17/2021 07:39   ECHOCARDIOGRAM COMPLETE  Result Date: 08/17/2021    ECHOCARDIOGRAM REPORT   Patient Name:   Jenna Brown Date of Exam: 08/17/2021 Medical Rec #:  [id number]     Height:       64.0 in Accession #:    [id number]    Weight:       152.0 lb Date of Birth:  Jul 02, 1951    BSA:          1.741 m Patient Age:    67 years      BP:           142/65 mmHg Patient Gender: F             HR:           118 bpm. Exam Location:  Inpatient Procedure: 2D Echo, Cardiac Doppler, Color Doppler and Intracardiac            Opacification Agent Indications:     NSTEMI  History:         Patient has prior history of Echocardiogram examinations, most                  recent 08/01/2018. CAD; Risk Factors:Hypertension.  Sonographer:     Glo Herring Referring Phys:  [id number] Orma Flaming Diagnosing Phys: Eleonore Chiquito MD IMPRESSIONS  1. The anterolateral wall is akinetic. The mid and distal septum, entire apex, and mid inferoseptum are akinetic. All other segments are hypokinetic. Findings consistent with multivessel CAD. No LV thrombus on contrast imaging. Left ventricular ejection  fraction, by estimation, is 20 to 25%. The left ventricle has severely decreased function. The left ventricle demonstrates regional wall motion abnormalities (see scoring diagram/findings for description). Indeterminate diastolic filling due to E-A fusion.  2. Right ventricular systolic function is normal. The right ventricular size is normal. Tricuspid regurgitation signal is inadequate for assessing PA pressure.  3. The mitral valve is degenerative. Trivial mitral valve  regurgitation. No evidence of mitral stenosis.  4. Focal calcification of the LCC noted. The aortic valve is tricuspid. There is mild calcification of the aortic valve. Aortic valve regurgitation is not visualized. Aortic valve sclerosis/calcification is present, without any evidence of aortic stenosis.  5. The inferior vena cava is normal in size with greater than 50% respiratory variability, suggesting right atrial pressure of  3 mmHg. Comparison(s): Changes from prior study are noted. EF now severely reduced 20-25%. Apical akinesis was present on prior study. Lateral akinesis now present with global hypokinesis in remaining segments. FINDINGS  Left Ventricle: The anterolateral wall is akinetic. The mid and distal septum, entire apex, and mid inferoseptum are akinetic. All other segments are hypokinetic. Findings consistent with multivessel CAD. No LV thrombus on contrast imaging. Left ventricular ejection fraction, by estimation, is 20 to 25%. The left ventricle has severely decreased function. The left ventricle demonstrates regional wall motion abnormalities. Definity contrast agent was given IV to delineate the left ventricular endocardial borders. The left ventricular internal cavity size was normal in size. There is no left ventricular hypertrophy. Indeterminate diastolic filling due to E-A fusion.  LV Wall Scoring: The antero-lateral wall, mid and distal anterior septum, entire apex, and mid inferoseptal segment are akinetic. The anterior wall, inferior wall, posterior wall, basal anteroseptal segment, and basal inferoseptal segment are hypokinetic. Right Ventricle: The right ventricular size is normal. No increase in right ventricular wall thickness. Right ventricular systolic function is normal. Tricuspid regurgitation signal is inadequate for assessing PA pressure. Left Atrium: Left atrial size was normal in size. Right Atrium: Right atrial size was normal in size. Pericardium: Trivial pericardial  effusion is present. Mitral Valve: The mitral valve is degenerative in appearance. Mild mitral annular calcification. Trivial mitral valve regurgitation. No evidence of mitral valve stenosis. Tricuspid Valve: The tricuspid valve is grossly normal. Tricuspid valve regurgitation is not demonstrated. No evidence of tricuspid stenosis. Aortic Valve: Focal calcification of the LCC noted. The aortic valve is tricuspid. There is mild calcification of the aortic valve. Aortic valve regurgitation is not visualized. Aortic valve sclerosis/calcification is present, without any evidence of aortic stenosis. Aortic valve mean gradient measures 4.0 mmHg. Aortic valve peak gradient measures 6.2 mmHg. Aortic valve area, by VTI measures 2.32 cm. Pulmonic Valve: The pulmonic valve was grossly normal. Pulmonic valve regurgitation is not visualized. No evidence of pulmonic stenosis. Aorta: The aortic root and ascending aorta are structurally normal, with no evidence of dilitation. Venous: The inferior vena cava is normal in size with greater than 50% respiratory variability, suggesting right atrial pressure of 3 mmHg. IAS/Shunts: The atrial septum is grossly normal.  LEFT VENTRICLE PLAX 2D LVIDd:         4.50 cm LVIDs:         3.90 cm LV PW:         1.10 cm LV IVS:        1.10 cm LVOT diam:     2.00 cm LV SV:         51 LV SV Index:   29 LVOT Area:     3.14 cm  LV Volumes (MOD) LV vol d, MOD A2C: 158.0 ml LV vol d, MOD A4C: 178.0 ml LV vol s, MOD A2C: 111.0 ml LV vol s, MOD A4C: 127.0 ml LV SV MOD A2C:     47.0 ml LV SV MOD A4C:     178.0 ml LV SV MOD BP:      47.9 ml RIGHT VENTRICLE          IVC RV Basal diam:  3.50 cm  IVC diam: 1.50 cm LEFT ATRIUM           Index        RIGHT ATRIUM          Index LA diam:      4.60 cm 2.64 cm/m   RA  Area:     8.55 cm LA Vol (A2C): 42.5 ml 24.41 ml/m  RA Volume:   16.10 ml 9.25 ml/m LA Vol (A4C): 47.6 ml 27.34 ml/m  AORTIC VALVE                    PULMONIC VALVE AV Area (Vmax):    2.05 cm      PV Vmax:       1.09 m/s AV Area (Vmean):   1.82 cm     PV Peak grad:  4.8 mmHg AV Area (VTI):     2.32 cm AV Vmax:           124.00 cm/s AV Vmean:          93.200 cm/s AV VTI:            0.219 m AV Peak Grad:      6.2 mmHg AV Mean Grad:      4.0 mmHg LVOT Vmax:         80.80 cm/s LVOT Vmean:        53.900 cm/s LVOT VTI:          0.162 m LVOT/AV VTI ratio: 0.74  AORTA Ao Root diam: 3.30 cm Ao Asc diam:  3.00 cm  SHUNTS Systemic VTI:  0.16 m Systemic Diam: 2.00 cm Eleonore Chiquito MD Electronically signed by Eleonore Chiquito MD Signature Date/Time: 08/17/2021/9:44:58 AM    Final (Updated)     Medications: Infusions:  sodium chloride     sodium chloride     sodium chloride     heparin 800 Units/hr (08/18/21 0153)    Scheduled Medications:  acetaminophen  500 mg Oral QHS   alum & mag hydroxide-simeth  30 mL Oral TID AC   vitamin C  500 mg Oral Daily   aspirin  324 mg Oral Once   aspirin  81 mg Oral Daily   atorvastatin  80 mg Oral Daily   carvedilol  3.125 mg Oral BID WC   Chlorhexidine Gluconate Cloth  6 each Topical Q0600   Chlorhexidine Gluconate Cloth  6 each Topical Q0600   cholecalciferol  1,000 Units Oral Daily   hydrALAZINE  10 mg Oral Q8H   insulin aspart  0-6 Units Subcutaneous TID WC   isosorbide mononitrate  15 mg Oral Daily   molnupiravir EUA  4 capsule Oral BID   pantoprazole  40 mg Oral Daily   sevelamer carbonate  800 mg Oral TID WC   sodium chloride flush  3 mL Intravenous Q12H   ticagrelor  90 mg Oral BID   zinc sulfate  220 mg Oral Daily    have reviewed scheduled and prn medications.  Physical Exam: General: Lying on bed comfortable, not in distress Heart:RRR, s1s2 nl Lungs:clear b/l, no crackle Abdomen:soft, Non-tender, non-distended Extremities: Right AKA, no edema Dialysis Access: Right upper extremity AV fistula has good thrill and bruit.  Jenna Brown 08/18/2021,8:55 AM  LOS: 1 day

## 2021-08-18 NOTE — Progress Notes (Signed)
Bp rechecked.  

## 2021-08-18 NOTE — Progress Notes (Signed)
BP cuff adjusted. BP rechecked.

## 2021-08-18 NOTE — Progress Notes (Signed)
Vital signs prior to 0952 11/30 were validated in error by writer. Writer assumed care of patient at [phone] for hemodialysis.

## 2021-08-18 NOTE — Progress Notes (Addendum)
Progress Note  Patient Name: Jenna Brown Date of Encounter: 08/18/2021  [facility] Cardiologist: Skeet Latch, MD   Subjective   Still with fatigue and some shortness of breath. Occasional cough. No problems during dialysis.  No chest pain.   Inpatient Medications    Scheduled Meds:  acetaminophen  500 mg Oral QHS   vitamin C  500 mg Oral Daily   aspirin  81 mg Oral Daily   atorvastatin  80 mg Oral Daily   carvedilol  3.125 mg Oral BID WC   Chlorhexidine Gluconate Cloth  6 each Topical Q0600   Chlorhexidine Gluconate Cloth  6 each Topical Q0600   cholecalciferol  1,000 Units Oral Daily   heparin injection (subcutaneous)  5,000 Units Subcutaneous Q8H   hydrALAZINE  10 mg Oral Q8H   insulin aspart  0-6 Units Subcutaneous TID WC   isosorbide mononitrate  15 mg Oral Daily   molnupiravir EUA  4 capsule Oral BID   pantoprazole  40 mg Oral Daily   sevelamer carbonate  800 mg Oral TID WC   sodium chloride flush  3 mL Intravenous Q12H   ticagrelor  90 mg Oral BID   zinc sulfate  220 mg Oral Daily   Continuous Infusions:  sodium chloride     PRN Meds: sodium chloride, acetaminophen **OR** acetaminophen, ALPRAZolam, docusate sodium, guaiFENesin-dextromethorphan, nitroGLYCERIN, ondansetron **OR** ondansetron (ZOFRAN) IV, oxyCODONE-acetaminophen, sodium chloride flush   Vital Signs    Vitals:   08/18/21 1429 08/18/21 1542 08/18/21 1633 08/18/21 1714  BP: (!) 144/107  96/81 113/60  Pulse: 88 87 97 89  Resp: (!) 21  20   Temp: 98.9 F (37.2 C)  98.7 F (37.1 C)   TempSrc: Oral Oral Oral   SpO2: 97% 95% 96%   Weight:      Height:        Intake/Output Summary (Last 24 hours) at 08/18/2021 1851 Last data filed at 08/18/2021 1320 Gross per 24 hour  Intake 492.59 ml  Output 625 ml  Net -132.41 ml   Last 3 Weights 08/18/2021 08/18/2021 08/18/2021  Weight (lbs) 150 lb 2.1 oz 149 lb 14.6 oz 152 lb 12.5 oz  Weight (kg) 68.1 kg 68 kg 69.3 kg      Telemetry     NSR - Personally Reviewed  ECG      Physical Exam   GEN: No acute distress.  Mild cough Neck: No JVD Cardiac: RRR, no murmurs, rubs, or gallops.  Respiratory: Clear to auscultation bilaterally. GI: Soft, nontender, non-distended  MS: toes with sloughing skin Neuro:  Nonfocal  Psych: Normal affect   Labs    High Sensitivity Troponin:   Recent Labs  Lab 08/16/21 1026 08/16/21 1158  TROPONINIHS 424* 381*     Chemistry Recent Labs  Lab 08/16/21 1026 08/16/21 2304 08/17/21 0645 08/18/21 0108  NA 137 137 137 135  K 4.7 4.6 4.5 3.8  CL 104 105 106 100  CO2 25 23 22 27   GLUCOSE 146* 122* 81 145*  BUN 50* 56* 56* 24*  CREATININE 7.74* 7.70* 7.70* 4.95*  CALCIUM 8.6* 8.1* 8.2* 8.0*  MG 2.0  --   --   --   PROT 6.1*  --  5.7* 5.7*  ALBUMIN 2.9* 2.7* 2.8* 2.7*  AST 21  --  21 22  ALT 14  --  13 13  ALKPHOS 72  --  60 62  BILITOT 0.6  --  0.6 0.7  GFRNONAA 5* 5* 5* 9*  ANIONGAP 8 9 9 8     Lipids  Recent Labs  Lab 08/17/21 0645  CHOL 136  TRIG 101  HDL 48  LDLCALC 68  CHOLHDL 2.8    Hematology Recent Labs  Lab 08/17/21 0645 08/18/21 0108 08/18/21 1021  WBC 4.4 5.6 6.0  RBC 3.45* 3.14* 3.12*  HGB 10.2* 9.4* 9.2*  HCT 34.1* 30.4* 30.2*  MCV 98.8 96.8 96.8  MCH 29.6 29.9 29.5  MCHC 29.9* 30.9 30.5  RDW 13.9 13.6 13.7  PLT 172 129* 144*   Thyroid No results for input(s): TSH, FREET4 in the last 168 hours.  BNP Recent Labs  Lab 08/16/21 0958  BNP 517.6*    DDimer  Recent Labs  Lab 08/16/21 2304 08/17/21 0645 08/18/21 0108  DDIMER 1.16* 1.07* 0.87*     Radiology    DG Chest Port 1 View  Result Date: 08/17/2021 CLINICAL DATA:  70 year old female positive COVID-19. Shortness of breath. EXAM: PORTABLE CHEST 1 VIEW COMPARISON:  Portable chest 08/16/2021 and earlier. FINDINGS: Portable AP semi upright view at 0720 hours. Stable low lung volumes. Allowing for portable technique the lungs are clear. Normal cardiac size and mediastinal contours.  Visualized tracheal air column is within normal limits. Chronic surgical clips projecting over the proximal humerus. No acute osseous abnormality identified. Negative visible bowel gas pattern. IMPRESSION: Stable low lung volumes with no acute cardiopulmonary abnormality. Electronically Signed   By: Genevie Ann M.D.   On: 08/17/2021 07:39   ECHOCARDIOGRAM COMPLETE  Result Date: 08/17/2021    ECHOCARDIOGRAM REPORT   Patient Name:   Jenna Brown Date of Exam: 08/17/2021 Medical Rec #:  [id number]     Height:       64.0 in Accession #:    [id number]    Weight:       152.0 lb Date of Birth:  01-Apr-1951    BSA:          1.741 m Patient Age:    70 years      BP:           142/65 mmHg Patient Gender: F             HR:           118 bpm. Exam Location:  Inpatient Procedure: 2D Echo, Cardiac Doppler, Color Doppler and Intracardiac            Opacification Agent Indications:     NSTEMI  History:         Patient has prior history of Echocardiogram examinations, most                  recent 08/01/2018. CAD; Risk Factors:Hypertension.  Sonographer:     Glo Herring Referring Phys:  [id number] Orma Flaming Diagnosing Phys: Eleonore Chiquito MD IMPRESSIONS  1. The anterolateral wall is akinetic. The mid and distal septum, entire apex, and mid inferoseptum are akinetic. All other segments are hypokinetic. Findings consistent with multivessel CAD. No LV thrombus on contrast imaging. Left ventricular ejection  fraction, by estimation, is 20 to 25%. The left ventricle has severely decreased function. The left ventricle demonstrates regional wall motion abnormalities (see scoring diagram/findings for description). Indeterminate diastolic filling due to E-A fusion.  2. Right ventricular systolic function is normal. The right ventricular size is normal. Tricuspid regurgitation signal is inadequate for assessing PA pressure.  3. The mitral valve is degenerative. Trivial mitral valve regurgitation. No evidence of mitral stenosis.  4. Focal  calcification of the LCC noted.  The aortic valve is tricuspid. There is mild calcification of the aortic valve. Aortic valve regurgitation is not visualized. Aortic valve sclerosis/calcification is present, without any evidence of aortic stenosis.  5. The inferior vena cava is normal in size with greater than 50% respiratory variability, suggesting right atrial pressure of 3 mmHg. Comparison(s): Changes from prior study are noted. EF now severely reduced 20-25%. Apical akinesis was present on prior study. Lateral akinesis now present with global hypokinesis in remaining segments. FINDINGS  Left Ventricle: The anterolateral wall is akinetic. The mid and distal septum, entire apex, and mid inferoseptum are akinetic. All other segments are hypokinetic. Findings consistent with multivessel CAD. No LV thrombus on contrast imaging. Left ventricular ejection fraction, by estimation, is 20 to 25%. The left ventricle has severely decreased function. The left ventricle demonstrates regional wall motion abnormalities. Definity contrast agent was given IV to delineate the left ventricular endocardial borders. The left ventricular internal cavity size was normal in size. There is no left ventricular hypertrophy. Indeterminate diastolic filling due to E-A fusion.  LV Wall Scoring: The antero-lateral wall, mid and distal anterior septum, entire apex, and mid inferoseptal segment are akinetic. The anterior wall, inferior wall, posterior wall, basal anteroseptal segment, and basal inferoseptal segment are hypokinetic. Right Ventricle: The right ventricular size is normal. No increase in right ventricular wall thickness. Right ventricular systolic function is normal. Tricuspid regurgitation signal is inadequate for assessing PA pressure. Left Atrium: Left atrial size was normal in size. Right Atrium: Right atrial size was normal in size. Pericardium: Trivial pericardial effusion is present. Mitral Valve: The mitral valve is  degenerative in appearance. Mild mitral annular calcification. Trivial mitral valve regurgitation. No evidence of mitral valve stenosis. Tricuspid Valve: The tricuspid valve is grossly normal. Tricuspid valve regurgitation is not demonstrated. No evidence of tricuspid stenosis. Aortic Valve: Focal calcification of the LCC noted. The aortic valve is tricuspid. There is mild calcification of the aortic valve. Aortic valve regurgitation is not visualized. Aortic valve sclerosis/calcification is present, without any evidence of aortic stenosis. Aortic valve mean gradient measures 4.0 mmHg. Aortic valve peak gradient measures 6.2 mmHg. Aortic valve area, by VTI measures 2.32 cm. Pulmonic Valve: The pulmonic valve was grossly normal. Pulmonic valve regurgitation is not visualized. No evidence of pulmonic stenosis. Aorta: The aortic root and ascending aorta are structurally normal, with no evidence of dilitation. Venous: The inferior vena cava is normal in size with greater than 50% respiratory variability, suggesting right atrial pressure of 3 mmHg. IAS/Shunts: The atrial septum is grossly normal.  LEFT VENTRICLE PLAX 2D LVIDd:         4.50 cm LVIDs:         3.90 cm LV PW:         1.10 cm LV IVS:        1.10 cm LVOT diam:     2.00 cm LV SV:         51 LV SV Index:   29 LVOT Area:     3.14 cm  LV Volumes (MOD) LV vol d, MOD A2C: 158.0 ml LV vol d, MOD A4C: 178.0 ml LV vol s, MOD A2C: 111.0 ml LV vol s, MOD A4C: 127.0 ml LV SV MOD A2C:     47.0 ml LV SV MOD A4C:     178.0 ml LV SV MOD BP:      47.9 ml RIGHT VENTRICLE          IVC RV Basal diam:  3.50 cm  IVC diam: 1.50 cm LEFT ATRIUM           Index        RIGHT ATRIUM          Index LA diam:      4.60 cm 2.64 cm/m   RA Area:     8.55 cm LA Vol (A2C): 42.5 ml 24.41 ml/m  RA Volume:   16.10 ml 9.25 ml/m LA Vol (A4C): 47.6 ml 27.34 ml/m  AORTIC VALVE                    PULMONIC VALVE AV Area (Vmax):    2.05 cm     PV Vmax:       1.09 m/s AV Area (Vmean):   1.82 cm      PV Peak grad:  4.8 mmHg AV Area (VTI):     2.32 cm AV Vmax:           124.00 cm/s AV Vmean:          93.200 cm/s AV VTI:            0.219 m AV Peak Grad:      6.2 mmHg AV Mean Grad:      4.0 mmHg LVOT Vmax:         80.80 cm/s LVOT Vmean:        53.900 cm/s LVOT VTI:          0.162 m LVOT/AV VTI ratio: 0.74  AORTA Ao Root diam: 3.30 cm Ao Asc diam:  3.00 cm  SHUNTS Systemic VTI:  0.16 m Systemic Diam: 2.00 cm Eleonore Chiquito MD Electronically signed by Eleonore Chiquito MD Signature Date/Time: 08/17/2021/9:44:58 AM    Final (Updated)     Cardiac Studies   Low EF  Patient Profile     70 y.o. female with decreased EF, elevated troponin  Assessment & Plan    Known CAD: demand ischemia presumably based on pattern of troponin. Systolic heart failure- volume status managed with dialysis. Medical therapy for now.  Could consider cath when she has recovered from [place].  For her LV dysfunction,  BiDil would be a good choice if BP can tolerate.  OK to give separate Imdur and Hydralazine.  BP soft currently so will hold off on the hydralazine until BP better. Can start hydralazine 10 mg TID  ESRD: per renal.  PAD: She was under the impression she has a vascular procedure tomorrow although I do not see any notes from vascular. I believe it was cancelled.  If rescheduled, could try to coordinate a diagnostic cath with a PV angio.      For questions or updates, please contact [place] Please consult [email or web address] for contact info under        Signed, Larae Grooms, MD  08/18/2021, 6:51 PM

## 2021-08-18 NOTE — Progress Notes (Signed)
Progress Note    Jenna JASMIN  Brown:[id number] DOB: Nov 08, 1950  DOA: 08/16/2021 PCP: [name], MD    Brief Narrative:     Medical records reviewed and are as summarized below:  Jenna Brown is an 70 y.o. female with medical history significant of 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MWF, PAD s/p right AKA and left LE SFA and popliteal artery stent placement for non healing foot wound, GERD, T2DM, HLD, HTN, CAD w/ hx of NSTEMI, hx of TIA, recurrent UTI, anxiety and vitamin B12 and D deficiency who presented to [facility] with chest pain and nausea. she was diagnosed with NSTEMI, seen by cardiology and admitted to the hospital for further care.   Assessment/Plan:   Principal Problem:   NSTEMI (non-ST elevated myocardial infarction) ([place]) Active Problems:   Essential hypertension   Hyperlipidemia LDL goal <70   Chronic combined systolic and diastolic heart failure (HCC)   CAD in native artery   Insulin-requiring or dependent type II diabetes mellitus ([place])   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([place])   COVID-19 virus infection   Anemia due to chronic kidney disease   Chest pain with possible NSTEMI in a patient with chronic systolic heart failure - mild to moderate rise in troponin, not an ACS pattern, EKG was nonspecific, has been seen by cardiology: EF now lower with increased BNP-likley from long standing CAD.  Troponin may be from acute systolic heart failure.  COVID myocarditis seems less likely.   CP free.  May need to consider cath once she is done with her COVID treatment.  Medical therapy for now.     COVID-19 infection.  - clinically no shortness of breath or hypoxia, stable CRP, no cough or shortness of breath.   - likely is incidental. -on molnupiravir (started 11/28)   ESRD.   -MWF schedule  -renal on board   Chronic combined systolic and diastolic heart failure EF has not dropped to 35% from 45% few years ago.  Not a candidate for ACE/ARB due to underlying renal insufficiency, placed on  combination of Coreg, hydralazine, and Imdur.   -Fluid removal via dialysis.   Essential hypertension.   -Stop Norvasc switch to cardiac medications    Dyslipidemia.   -On statin  -LDL is below 70.    Right BKA.   -Supportive care    Anemia of chronic disease -hgb stable   History of TIA.   -Continue aspirin and statin for secondary prevention.   DM type II.   -On sliding scale.     Family Communication/Anticipated D/C date and plan/Code Status   DVT prophylaxis: heparin Code Status: Full Code.  Disposition Plan: Status is: Inpatient  Remains inpatient appropriate because: need further cardiac work up         Medical Consultants:   Renal for HD cards    Subjective:   C/o fatigue-- did not sleep well last PM  Objective:    Vitals:   08/18/21 1130 08/18/21 1145 08/18/21 1152 08/18/21 1200  BP: 132/64 94/62 122/61 (!) 134/57  Pulse: 78 75 75 82  Resp: (!) 25 14 14 20   Temp:      TempSrc:      SpO2: 98% 98% 99% 97%  Weight:      Height:        Intake/Output Summary (Last 24 hours) at 08/18/2021 1219 Last data filed at 08/17/2021 2059 Gross per 24 hour  Intake 492.59 ml  Output 1021 ml  Net -528.41 ml   [employer]  08/16/21 2315 08/18/21 0352 08/18/21 0952  Weight: 68.9 kg 69.3 kg 68 kg    Exam:  General: Appearance:     Overweight female in no acute distress     Lungs:     respirations unlabored  Heart:    Normal heart rate.    MS:   Right AKA  Neurologic:   Awake, alert, oriented x 3. No apparent focal neurological           defect.      Data Reviewed:   I have personally reviewed following labs and imaging studies:  Labs: Labs show the following:   Basic Metabolic Panel: Recent Labs  Lab 08/16/21 1026 08/16/21 2304 08/17/21 0645 08/18/21 0108  NA 137 137 137 135  K 4.7 4.6 4.5 3.8  CL 104 105 106 100  CO2 25 23 22 27   GLUCOSE 146* 122* 81 145*  BUN 50* 56* 56* 24*  CREATININE 7.74* 7.70* 7.70* 4.95*   CALCIUM 8.6* 8.1* 8.2* 8.0*  MG 2.0  --   --   --   PHOS  --  3.7  --   --    GFR Estimated Creatinine Clearance: 10.3 mL/min (A) (by C-G formula based on SCr of 4.95 mg/dL (H)). Liver Function Tests: Recent Labs  Lab 08/16/21 1026 08/16/21 2304 08/17/21 0645 08/18/21 0108  AST 21  --  21 22  ALT 14  --  13 13  ALKPHOS 72  --  60 62  BILITOT 0.6  --  0.6 0.7  PROT 6.1*  --  5.7* 5.7*  ALBUMIN 2.9* 2.7* 2.8* 2.7*   No results for input(s): LIPASE, AMYLASE in the last 168 hours. No results for input(s): AMMONIA in the last 168 hours. Coagulation profile Recent Labs  Lab 08/16/21 1026  INR 1.0    CBC: Recent Labs  Lab 08/16/21 1026 08/16/21 2304 08/17/21 0645 08/18/21 0108 08/18/21 1021  WBC 4.6 5.6 4.4 5.6 6.0  NEUTROABS 2.9  --   --   --   --   HGB 10.5* 10.0* 10.2* 9.4* 9.2*  HCT 34.7* 33.6* 34.1* 30.4* 30.2*  MCV 100.0 99.4 98.8 96.8 96.8  PLT 171 165 172 129* 144*   Cardiac Enzymes: No results for input(s): CKTOTAL, CKMB, CKMBINDEX, TROPONINI in the last 168 hours. BNP (last 3 results) No results for input(s): PROBNP in the last 8760 hours. CBG: Recent Labs  Lab 08/16/21 2312 08/17/21 0857 08/17/21 1727 08/17/21 2213 08/18/21 0615  GLUCAP 120* 96 83 161* 140*   D-Dimer: Recent Labs    08/17/21 0645 08/18/21 0108  DDIMER 1.07* 0.87*   Hgb A1c: No results for input(s): HGBA1C in the last 72 hours. Lipid Profile: Recent Labs    08/17/21 0645  CHOL 136  HDL 48  LDLCALC 68  TRIG 101  CHOLHDL 2.8   Thyroid function studies: No results for input(s): TSH, T4TOTAL, T3FREE, THYROIDAB in the last 72 hours.  Invalid input(s): FREET3 Anemia work up: Recent Labs    08/17/21 0910 08/18/21 1021  FERRITIN 1,270* 1,373*   Sepsis Labs: Recent Labs  Lab 08/16/21 2304 08/17/21 0645 08/18/21 0108 08/18/21 1021  WBC 5.6 4.4 5.6 6.0    Microbiology Recent Results (from the past 240 hour(s))  Resp Panel by RT-PCR (Flu A&B, Covid)  Nasopharyngeal Swab     Status: Abnormal   Collection Time: 08/16/21  9:58 AM   Specimen: Nasopharyngeal Swab; Nasopharyngeal(NP) swabs in vial transport medium  Result Value Ref Range Status  SARS Coronavirus 2 by RT PCR POSITIVE (A) NEGATIVE Final    Comment: RESULT CALLED TO, READ BACK BY AND VERIFIED WITH: RN B MONTEE [id number] AT 1317 BY CM (NOTE) SARS-CoV-2 target nucleic acids are DETECTED.  The SARS-CoV-2 RNA is generally detectable in upper respiratory specimens during the acute phase of infection. Positive results are indicative of the presence of the identified virus, but do not rule out bacterial infection or co-infection with other pathogens not detected by the test. Clinical correlation with patient history and other diagnostic information is necessary to determine patient infection status. The expected result is Negative.  Fact Sheet for Patients: EntrepreneurPulse.com.au  Fact Sheet for Healthcare Providers: IncredibleEmployment.be  This test is not yet approved or cleared by the [place] FDA and  has been authorized for detection and/or diagnosis of SARS-CoV-2 by FDA under an Emergency Use Authorization (EUA).  This EUA will remain in effect (meaning this test can be  used) for the duration of  the COVID-19 declaration under Section 564(b)(1) of the Act, 21 U.S.C. section 360bbb-3(b)(1), unless the authorization is terminated or revoked sooner.     Influenza A by PCR NEGATIVE NEGATIVE Final   Influenza B by PCR NEGATIVE NEGATIVE Final    Comment: (NOTE) The Xpert Xpress SARS-CoV-2/FLU/RSV plus assay is intended as an aid in the diagnosis of influenza from Nasopharyngeal swab specimens and should not be used as a sole basis for treatment. Nasal washings and aspirates are unacceptable for Xpert Xpress SARS-CoV-2/FLU/RSV testing.  Fact Sheet for Patients: EntrepreneurPulse.com.au  Fact Sheet for  Healthcare Providers: IncredibleEmployment.be  This test is not yet approved or cleared by the [place] FDA and has been authorized for detection and/or diagnosis of SARS-CoV-2 by FDA under an Emergency Use Authorization (EUA). This EUA will remain in effect (meaning this test can be used) for the duration of the COVID-19 declaration under Section 564(b)(1) of the Act, 21 U.S.C. section 360bbb-3(b)(1), unless the authorization is terminated or revoked.  Performed at [facility], [place] [address], [place], [place] [address]   Respiratory (~20 pathogens) panel by PCR     Status: None   Collection Time: 08/17/21  9:09 AM   Specimen: Nasopharyngeal Swab; Respiratory  Result Value Ref Range Status   Adenovirus NOT DETECTED NOT DETECTED Final   Coronavirus 229E NOT DETECTED NOT DETECTED Final    Comment: (NOTE) The Coronavirus on the Respiratory Panel, DOES NOT test for the novel  Coronavirus (2019 nCoV)    Coronavirus HKU1 NOT DETECTED NOT DETECTED Final   Coronavirus NL63 NOT DETECTED NOT DETECTED Final   Coronavirus OC43 NOT DETECTED NOT DETECTED Final   Metapneumovirus NOT DETECTED NOT DETECTED Final   Rhinovirus / Enterovirus NOT DETECTED NOT DETECTED Final   Influenza A NOT DETECTED NOT DETECTED Final   Influenza B NOT DETECTED NOT DETECTED Final   Parainfluenza Virus 1 NOT DETECTED NOT DETECTED Final   Parainfluenza Virus 2 NOT DETECTED NOT DETECTED Final   Parainfluenza Virus 3 NOT DETECTED NOT DETECTED Final   Parainfluenza Virus 4 NOT DETECTED NOT DETECTED Final   Respiratory Syncytial Virus NOT DETECTED NOT DETECTED Final   Bordetella pertussis NOT DETECTED NOT DETECTED Final   Bordetella Parapertussis NOT DETECTED NOT DETECTED Final   Chlamydophila pneumoniae NOT DETECTED NOT DETECTED Final   Mycoplasma pneumoniae NOT DETECTED NOT DETECTED Final    Comment: Performed at [facility], [place]. [address] [place], [place] [address]  MRSA Next  Gen by PCR, Nasal  Status: None   Collection Time: 08/17/21  8:56 PM   Specimen: Nasal Mucosa; Nasal Swab  Result Value Ref Range Status   MRSA by PCR Next Gen NOT DETECTED NOT DETECTED Final    Comment: (NOTE) The GeneXpert MRSA Assay (FDA approved for NASAL specimens only), is one component of a comprehensive MRSA colonization surveillance program. It is not intended to diagnose MRSA infection nor to guide or monitor treatment for MRSA infections. Test performance is not FDA approved in patients less than 57 years old. Performed at [facility], [place] [address], [place], [place] [address]     Procedures and diagnostic studies:  DG Chest Port 1 View  Result Date: 08/17/2021 CLINICAL DATA:  70 year old female positive COVID-19. Shortness of breath. EXAM: PORTABLE CHEST 1 VIEW COMPARISON:  Portable chest 08/16/2021 and earlier. FINDINGS: Portable AP semi upright view at 0720 hours. Stable low lung volumes. Allowing for portable technique the lungs are clear. Normal cardiac size and mediastinal contours. Visualized tracheal air column is within normal limits. Chronic surgical clips projecting over the proximal humerus. No acute osseous abnormality identified. Negative visible bowel gas pattern. IMPRESSION: Stable low lung volumes with no acute cardiopulmonary abnormality. Electronically Signed   By: Genevie Ann M.D.   On: 08/17/2021 07:39   ECHOCARDIOGRAM COMPLETE  Result Date: 08/17/2021    ECHOCARDIOGRAM REPORT   Patient Name:   Jenna Brown Date of Exam: 08/17/2021 Medical Rec #:  [id number]     Height:       64.0 in Accession #:    [id number]    Weight:       152.0 lb Date of Birth:  Jul 24, 1951    BSA:          1.741 m Patient Age:    26 years      BP:           142/65 mmHg Patient Gender: F             HR:           118 bpm. Exam Location:  Inpatient Procedure: 2D Echo, Cardiac Doppler, Color Doppler and Intracardiac            Opacification Agent Indications:     NSTEMI  History:          Patient has prior history of Echocardiogram examinations, most                  recent 08/01/2018. CAD; Risk Factors:Hypertension.  Sonographer:     Glo Herring Referring Phys:  [id number] Orma Flaming Diagnosing Phys: Eleonore Chiquito MD IMPRESSIONS  1. The anterolateral wall is akinetic. The mid and distal septum, entire apex, and mid inferoseptum are akinetic. All other segments are hypokinetic. Findings consistent with multivessel CAD. No LV thrombus on contrast imaging. Left ventricular ejection  fraction, by estimation, is 20 to 25%. The left ventricle has severely decreased function. The left ventricle demonstrates regional wall motion abnormalities (see scoring diagram/findings for description). Indeterminate diastolic filling due to E-A fusion.  2. Right ventricular systolic function is normal. The right ventricular size is normal. Tricuspid regurgitation signal is inadequate for assessing PA pressure.  3. The mitral valve is degenerative. Trivial mitral valve regurgitation. No evidence of mitral stenosis.  4. Focal calcification of the LCC noted. The aortic valve is tricuspid. There is mild calcification of the aortic valve. Aortic valve regurgitation is not visualized. Aortic valve sclerosis/calcification is present, without any evidence of aortic stenosis.  5.  The inferior vena cava is normal in size with greater than 50% respiratory variability, suggesting right atrial pressure of 3 mmHg. Comparison(s): Changes from prior study are noted. EF now severely reduced 20-25%. Apical akinesis was present on prior study. Lateral akinesis now present with global hypokinesis in remaining segments. FINDINGS  Left Ventricle: The anterolateral wall is akinetic. The mid and distal septum, entire apex, and mid inferoseptum are akinetic. All other segments are hypokinetic. Findings consistent with multivessel CAD. No LV thrombus on contrast imaging. Left ventricular ejection fraction, by estimation, is 20 to 25%.  The left ventricle has severely decreased function. The left ventricle demonstrates regional wall motion abnormalities. Definity contrast agent was given IV to delineate the left ventricular endocardial borders. The left ventricular internal cavity size was normal in size. There is no left ventricular hypertrophy. Indeterminate diastolic filling due to E-A fusion.  LV Wall Scoring: The antero-lateral wall, mid and distal anterior septum, entire apex, and mid inferoseptal segment are akinetic. The anterior wall, inferior wall, posterior wall, basal anteroseptal segment, and basal inferoseptal segment are hypokinetic. Right Ventricle: The right ventricular size is normal. No increase in right ventricular wall thickness. Right ventricular systolic function is normal. Tricuspid regurgitation signal is inadequate for assessing PA pressure. Left Atrium: Left atrial size was normal in size. Right Atrium: Right atrial size was normal in size. Pericardium: Trivial pericardial effusion is present. Mitral Valve: The mitral valve is degenerative in appearance. Mild mitral annular calcification. Trivial mitral valve regurgitation. No evidence of mitral valve stenosis. Tricuspid Valve: The tricuspid valve is grossly normal. Tricuspid valve regurgitation is not demonstrated. No evidence of tricuspid stenosis. Aortic Valve: Focal calcification of the LCC noted. The aortic valve is tricuspid. There is mild calcification of the aortic valve. Aortic valve regurgitation is not visualized. Aortic valve sclerosis/calcification is present, without any evidence of aortic stenosis. Aortic valve mean gradient measures 4.0 mmHg. Aortic valve peak gradient measures 6.2 mmHg. Aortic valve area, by VTI measures 2.32 cm. Pulmonic Valve: The pulmonic valve was grossly normal. Pulmonic valve regurgitation is not visualized. No evidence of pulmonic stenosis. Aorta: The aortic root and ascending aorta are structurally normal, with no evidence of  dilitation. Venous: The inferior vena cava is normal in size with greater than 50% respiratory variability, suggesting right atrial pressure of 3 mmHg. IAS/Shunts: The atrial septum is grossly normal.  LEFT VENTRICLE PLAX 2D LVIDd:         4.50 cm LVIDs:         3.90 cm LV PW:         1.10 cm LV IVS:        1.10 cm LVOT diam:     2.00 cm LV SV:         51 LV SV Index:   29 LVOT Area:     3.14 cm  LV Volumes (MOD) LV vol d, MOD A2C: 158.0 ml LV vol d, MOD A4C: 178.0 ml LV vol s, MOD A2C: 111.0 ml LV vol s, MOD A4C: 127.0 ml LV SV MOD A2C:     47.0 ml LV SV MOD A4C:     178.0 ml LV SV MOD BP:      47.9 ml RIGHT VENTRICLE          IVC RV Basal diam:  3.50 cm  IVC diam: 1.50 cm LEFT ATRIUM           Index        RIGHT ATRIUM  Index LA diam:      4.60 cm 2.64 cm/m   RA Area:     8.55 cm LA Vol (A2C): 42.5 ml 24.41 ml/m  RA Volume:   16.10 ml 9.25 ml/m LA Vol (A4C): 47.6 ml 27.34 ml/m  AORTIC VALVE                    PULMONIC VALVE AV Area (Vmax):    2.05 cm     PV Vmax:       1.09 m/s AV Area (Vmean):   1.82 cm     PV Peak grad:  4.8 mmHg AV Area (VTI):     2.32 cm AV Vmax:           124.00 cm/s AV Vmean:          93.200 cm/s AV VTI:            0.219 m AV Peak Grad:      6.2 mmHg AV Mean Grad:      4.0 mmHg LVOT Vmax:         80.80 cm/s LVOT Vmean:        53.900 cm/s LVOT VTI:          0.162 m LVOT/AV VTI ratio: 0.74  AORTA Ao Root diam: 3.30 cm Ao Asc diam:  3.00 cm  SHUNTS Systemic VTI:  0.16 m Systemic Diam: 2.00 cm Eleonore Chiquito MD Electronically signed by Eleonore Chiquito MD Signature Date/Time: 08/17/2021/9:44:58 AM    Final (Updated)     Medications:    acetaminophen  500 mg Oral QHS   alum & mag hydroxide-simeth  30 mL Oral TID AC   vitamin C  500 mg Oral Daily   aspirin  324 mg Oral Once   aspirin  81 mg Oral Daily   atorvastatin  80 mg Oral Daily   carvedilol  3.125 mg Oral BID WC   Chlorhexidine Gluconate Cloth  6 each Topical Q0600   Chlorhexidine Gluconate Cloth  6 each Topical  Q0600   cholecalciferol  1,000 Units Oral Daily   hydrALAZINE  10 mg Oral Q8H   insulin aspart  0-6 Units Subcutaneous TID WC   isosorbide mononitrate  15 mg Oral Daily   molnupiravir EUA  4 capsule Oral BID   pantoprazole  40 mg Oral Daily   sevelamer carbonate  800 mg Oral TID WC   sodium chloride flush  3 mL Intravenous Q12H   ticagrelor  90 mg Oral BID   zinc sulfate  220 mg Oral Daily   Continuous Infusions:  sodium chloride     sodium chloride     sodium chloride     heparin 800 Units/hr (08/18/21 0153)     LOS: 1 day   Geradine Girt  [facility]   How to contact the [facility] Attending or Consulting provider 7A - 7P or covering provider during after hours 7P -7A, for this patient?  Check the care team in [facility] and look for a) attending/consulting [facility] provider listed and b) the [facility] team listed Log into [email or web address] and use [place]'s universal password to access. If you do not have the password, please contact the hospital operator. Locate the [facility] provider you are looking for under [facility] and page to a number that you can be directly reached. If you still have difficulty reaching the provider, please page the [facility] (Director on Call) for the Hospitalists listed on amion for assistance.  08/18/2021, 12:19 PM

## 2021-08-18 NOTE — Progress Notes (Signed)
ANTICOAGULATION CONSULT NOTE  Pharmacy Consult for heparin Indication: chest pain/ACS  Allergies  Allergen Reactions   Plavix [Clopidogrel Bisulfate] Itching   Codeine Other (See Comments)    "makes me feel strange"   Losartan Other (See Comments)   Tylenol With Codeine #3 [Acetaminophen-Codeine] Other (See Comments)    "doesn't make me feel right"    Patient Measurements: Height: 5\' 4"  (162.6 cm) Weight:  (Patient is in stretcher, no bedscale available and weighing scale.) IBW/kg (Calculated) : 54.7 Heparin Dosing Weight: 69 kg  Vital Signs: Temp: 99.5 F (37.5 C) (11/29 2052) Temp Source: Oral (11/29 2052) BP: 104/83 (11/29 2300) Pulse Rate: 79 (11/29 2300)  Labs: Recent Labs    08/16/21 1026 08/16/21 1158 08/16/21 2304 08/17/21 0645 08/17/21 0910 08/18/21 0108  HGB 10.5*  --  10.0* 10.2*  --  9.4*  HCT 34.7*  --  33.6* 34.1*  --  30.4*  PLT 171  --  165 172  --  129*  LABPROT 13.0  --   --   --   --   --   INR 1.0  --   --   --   --   --   HEPARINUNFRC  --   --  0.90*  --  0.48 0.18*  CREATININE 7.74*  --  7.70* 7.70*  --   --   TROPONINIHS 424* 381*  --   --   --   --      Estimated Creatinine Clearance: 6.7 mL/min (A) (by C-G formula based on SCr of 7.7 mg/dL (H)).  Assessment: 69 YOF presenting with CP,SOB, elevated troponin and found to have NSTEMI. No PTA anticoagulation. Pharmacy consulted to start heparin.  Heparin level down to subtherapeutic (0.18) on gtt at 700 units/hr. No issues with line or bleeding reported per RN. Hgb down to 9.4, plt down to 129. Per Cards notes - plan for heparin x 48 hours (should end 11/30 1400)  Goal of Therapy:  Heparin level 0.3-0.7 units/ml Monitor platelets by anticoagulation protocol: Yes   Plan:  Increase heparin to 800 units/hr Will not check another level if plan to turn heparin off ~1400.  Sherlon Handing, PharmD, BCPS Please see amion for complete clinical pharmacist phone list 08/18/2021,1:40 AM

## 2021-08-18 NOTE — Evaluation (Signed)
Physical Therapy Evaluation Patient Details Name: Jenna Brown MRN: [id number] DOB: [birth year] Today's Date: 08/18/2021  History of Present Illness  Pt is a 70 year old woman admitted on 11/28  with nausea, chest pain and diaphoresis--possible NSTEMI in the setting of diffuse, longstanding CAD. Pt also COVID positive. PMH: ESRD with MWF HD, PAD s/p R AKA, L LE SFA popliteal artery stent, HTN, DM2, CHF, TIA, chronic UTI, anxiety, B12 and Vit D deficiency.   Clinical Impression  Pt in bed upon arrival of PT, agreeable to evaluation at this time. Prior to admission the pt was mobilizing at a wheelchair level, but able to complete pivoting transfers independently. The pt lives at home with family who can assist as needed, but reports general independence PTA. The pt now presents with minor limitations in functional mobility due to above dx, but is very close to her functional baseline as she was able to demo multiple pivot transfers from bed to [facility] and from bed to recliner without assist. The pt was able to generate good hip clearance with use of LLE and BUE support, and able to maintain stability with single UE support to allow her to perform self-care and toileting. The pt is eager to return home and will be safe to return home with the DME she already has acquired and family assist as needed. Will continue to benefit from skilled PT acutely to maintain functional strength and stability while admitted, no need for follow up therapies at this time.           Recommendations for follow up therapy are one component of a multi-disciplinary discharge planning process, led by the attending physician.  Recommendations may be updated based on patient status, additional functional criteria and insurance authorization.  Follow Up Recommendations No PT follow up    Assistance Recommended at Discharge PRN  Functional Status Assessment Patient has not had a recent decline in their functional status  Equipment  Recommendations  None recommended by PT    Recommendations for Other Services       Precautions / Restrictions Precautions Precautions: Fall Precaution Comments: chronic R AKA Restrictions Weight Bearing Restrictions: No      Mobility  Bed Mobility               General bed mobility comments: seated at EOB upon arrival    Transfers Overall transfer level: Needs assistance Equipment used: None Transfers: Bed to chair/wheelchair/BSC       Squat pivot transfers: Supervision     General transfer comment: pt able to complete pivot to and from [facility] and then to recliner with supervision for safety but no physical assist. drop arm BSC but arm not dropped for recliner with no issue          Balance Overall balance assessment: Needs assistance Sitting-balance support: No upper extremity supported Sitting balance-Leahy Scale: Good Sitting balance - Comments: able to reach outside of BOS   Standing balance support: Bilateral upper extremity supported Standing balance-Leahy Scale: Poor Standing balance comment: dependent on BUE support to stand and pivot on single leg                             Pertinent Vitals/Pain Pain Assessment: No/denies pain    Home Living Family/patient expects to be discharged to:: Private residence Living Arrangements: Parent;Other relatives (sister and mother) Available Help at Discharge: Family;Available 24 hours/day Type of Home: House  Home Layout: One level Home Equipment: Shower seat;Wheelchair - manual      Prior Function Prior Level of Function : Needs assist             Mobility Comments: transfers and propels manual w/c independently ADLs Comments: sister assists with shower tranfers and IADL     Hand Dominance   Dominant Hand: Right    Extremity/Trunk Assessment   Upper Extremity Assessment Upper Extremity Assessment: Defer to OT evaluation    Lower Extremity Assessment Lower  Extremity Assessment: RLE deficits/detail RLE Deficits / Details: chonic R AKA, able to demo great ROM and functional power/strength with LLE for pivoting transfers    Cervical / Trunk Assessment Cervical / Trunk Assessment: Normal  Communication   Communication: No difficulties  Cognition Arousal/Alertness: Awake/alert Behavior During Therapy: WFL for tasks assessed/performed Overall Cognitive Status: Within Functional Limits for tasks assessed                                          General Comments General comments (skin integrity, edema, etc.): VSS on RA    Exercises     Assessment/Plan    PT Assessment Patient needs continued PT services  PT Problem List Decreased strength;Decreased range of motion;Decreased activity tolerance;Decreased balance;Decreased mobility;Decreased coordination       PT Treatment Interventions DME instruction;Gait training;Stair training;Functional mobility training;Therapeutic activities;Therapeutic exercise;Balance training;Patient/family education    PT Goals (Current goals can be found in the Care Plan section)  Acute Rehab PT Goals Patient Stated Goal: return home PT Goal Formulation: With patient Time For Goal Achievement: 09/01/21 Potential to Achieve Goals: Good    Frequency Min 3X/week    AM-PAC PT "6 Clicks" Mobility  Outcome Measure Help needed turning from your back to your side while in a flat bed without using bedrails?: None Help needed moving from lying on your back to sitting on the side of a flat bed without using bedrails?: None Help needed moving to and from a bed to a chair (including a wheelchair)?: A Little Help needed standing up from a chair using your arms (e.g., wheelchair or bedside chair)?: A Little Help needed to walk in hospital room?: Total Help needed climbing 3-5 steps with a railing? : Total 6 Click Score: 16    End of Session   Activity Tolerance: Patient tolerated treatment  well Patient left: in chair;with call bell/phone within reach;with chair alarm set Nurse Communication: Mobility status PT Visit Diagnosis: Other abnormalities of gait and mobility (R26.89);Muscle weakness (generalized) (M62.81)    Time: 0177-9390 PT Time Calculation (min) (ACUTE ONLY): 22 min   Charges:   PT Evaluation $PT Eval Low Complexity: 1 Low          [place] Carbo, PT, DPT   Acute Rehabilitation Department Pager #: [phone]  Sandra Cockayne 08/18/2021, 4:38 PM

## 2021-08-18 NOTE — Progress Notes (Signed)
OT Cancellation Note  Patient Details Name: ADRYANA MOGENSEN MRN: [id number] DOB: [birth year]   Cancelled Treatment:    Reason Eval/Treat Not Completed: Patient at procedure or test/ unavailable (HD). Will follow.   Malka So 08/18/2021, 10:25 AM Nestor Lewandowsky, OTR/L Acute Rehabilitation Services Pager: [phone] Office: [phone]

## 2021-08-19 ENCOUNTER — Other Ambulatory Visit (HOSPITAL_COMMUNITY): Payer: Self-pay

## 2021-08-19 ENCOUNTER — Ambulatory Visit (HOSPITAL_COMMUNITY): Admit: 2021-08-19 | Payer: Medicare Other | Admitting: Vascular Surgery

## 2021-08-19 ENCOUNTER — Encounter (HOSPITAL_COMMUNITY): Payer: Self-pay

## 2021-08-19 LAB — CBC
HCT: 28.3 % — ABNORMAL LOW (ref 36.0–46.0)
Hemoglobin: 8.6 g/dL — ABNORMAL LOW (ref 12.0–15.0)
MCH: 29.5 pg (ref 26.0–34.0)
MCHC: 30.4 g/dL (ref 30.0–36.0)
MCV: 96.9 fL (ref 80.0–100.0)
Platelets: 135 10*3/uL — ABNORMAL LOW (ref 150–400)
RBC: 2.92 MIL/uL — ABNORMAL LOW (ref 3.87–5.11)
RDW: 13.6 % (ref 11.5–15.5)
WBC: 7.6 10*3/uL (ref 4.0–10.5)
nRBC: 0 % (ref 0.0–0.2)

## 2021-08-19 LAB — HEMOGLOBIN AND HEMATOCRIT, BLOOD
HCT: 30.8 % — ABNORMAL LOW (ref 36.0–46.0)
Hemoglobin: 9.4 g/dL — ABNORMAL LOW (ref 12.0–15.0)

## 2021-08-19 LAB — D-DIMER, QUANTITATIVE: D-Dimer, Quant: 0.52 ug/mL-FEU — ABNORMAL HIGH (ref 0.00–0.50)

## 2021-08-19 LAB — COMPREHENSIVE METABOLIC PANEL
ALT: 12 U/L (ref 0–44)
AST: 19 U/L (ref 15–41)
Albumin: 2.6 g/dL — ABNORMAL LOW (ref 3.5–5.0)
Alkaline Phosphatase: 56 U/L (ref 38–126)
Anion gap: 9 (ref 5–15)
BUN: 18 mg/dL (ref 8–23)
CO2: 27 mmol/L (ref 22–32)
Calcium: 8.1 mg/dL — ABNORMAL LOW (ref 8.9–10.3)
Chloride: 98 mmol/L (ref 98–111)
Creatinine, Ser: 4.03 mg/dL — ABNORMAL HIGH (ref 0.44–1.00)
GFR, Estimated: 12 mL/min — ABNORMAL LOW (ref >60)
Glucose, Bld: 165 mg/dL — ABNORMAL HIGH (ref 70–99)
Potassium: 3.6 mmol/L (ref 3.5–5.1)
Sodium: 134 mmol/L — ABNORMAL LOW (ref 135–145)
Total Bilirubin: 0.8 mg/dL (ref 0.3–1.2)
Total Protein: 5.4 g/dL — ABNORMAL LOW (ref 6.5–8.1)

## 2021-08-19 LAB — GLUCOSE, CAPILLARY
Glucose-Capillary: 111 mg/dL — ABNORMAL HIGH (ref 70–99)
Glucose-Capillary: 150 mg/dL — ABNORMAL HIGH (ref 70–99)

## 2021-08-19 LAB — HEPATITIS B SURFACE ANTIGEN: Hepatitis B Surface Ag: NONREACTIVE

## 2021-08-19 LAB — C-REACTIVE PROTEIN: CRP: 1.6 mg/dL — ABNORMAL HIGH (ref <1.0)

## 2021-08-19 SURGERY — ABDOMINAL AORTOGRAM W/LOWER EXTREMITY
Anesthesia: LOCAL

## 2021-08-19 MED ORDER — CHLORHEXIDINE GLUCONATE CLOTH 2 % EX PADS: 6.0000 | MEDICATED_PAD | Freq: Every day | CUTANEOUS | Status: DC

## 2021-08-19 MED ORDER — DARBEPOETIN ALFA 60 MCG/0.3ML IJ SOSY: 60.0000 ug | PREFILLED_SYRINGE | INTRAMUSCULAR | Status: DC

## 2021-08-19 MED ORDER — FLUCONAZOLE 150 MG PO TABS
150.0000 mg | ORAL_TABLET | Freq: Once | ORAL | Status: AC
  Administered 2021-08-19: 150 mg via ORAL
  Filled 2021-08-19: qty 1

## 2021-08-19 MED ORDER — MOLNUPIRAVIR EUA 200MG CAPSULE
4.0000 | ORAL_CAPSULE | Freq: Two times a day (BID) | ORAL | 0 refills | Status: AC
Start: 2021-08-19 — End: 2021-08-24

## 2021-08-19 MED ORDER — CARVEDILOL 3.125 MG PO TABS
3.1250 mg | ORAL_TABLET | Freq: Two times a day (BID) | ORAL | 0 refills | Status: DC
  Filled 2021-08-19: qty 60, 30d supply, fill #0

## 2021-08-19 MED ORDER — ISOSORBIDE MONONITRATE ER 30 MG PO TB24
15.0000 mg | ORAL_TABLET | Freq: Every day | ORAL | 0 refills | Status: DC
  Filled 2021-08-19: qty 30, 60d supply, fill #0

## 2021-08-19 MED ORDER — HYDRALAZINE HCL 10 MG PO TABS
ORAL_TABLET | ORAL | 0 refills | Status: DC
  Filled 2021-08-19: qty 90, 30d supply, fill #0

## 2021-08-19 MED ORDER — ZINC SULFATE 220 (50 ZN) MG PO CAPS: 220.0000 mg | ORAL_CAPSULE | Freq: Every day | ORAL | Status: DC

## 2021-08-19 NOTE — Plan of Care (Signed)

## 2021-08-19 NOTE — TOC Initial Note (Signed)
Transition of Care [facility]) - Initial/Assessment Note    Patient Details  Name: Jenna Brown MRN: [id number] Date of Birth: 08-12-1951  Transition of [facility]) CM/SW Contact:    Zenon Mayo, RN Phone Number: 08/19/2021, 1:13 PM  Clinical Narrative:                 NCM spoke with patient, she states she has three people that stay with her at home, and they are bad about not answering the phone.  She states ptar can transport her home she has a key.  She is for dc today.  She has a w/chair at home.  NCM scheduled PTAR for transport. She is COVID positive.   Expected Discharge Plan: Home/Self Care Barriers to Discharge: No Barriers Identified   Patient Goals and CMS Choice Patient states their goals for this hospitalization and ongoing recovery are:: return home   Choice offered to / list presented to : NA  Expected Discharge Plan and Services Expected Discharge Plan: Home/Self Care   Discharge Planning Services: CM Consult Post Acute Care Choice: NA Living arrangements for the past 2 months: Single Family Home Expected Discharge Date: 08/19/21                 DME Agency: NA       HH Arranged: NA          Prior Living Arrangements/Services Living arrangements for the past 2 months: Single Family Home Lives with:: Relatives, Siblings   Do you feel safe going back to the place where you live?: Yes      Need for Family Participation in Patient Care: Yes (Comment) Care giver support system in place?: Yes (comment) Current home services: DME (w/chair) Criminal Activity/Legal Involvement Pertinent to Current Situation/Hospitalization: No - Comment as needed  Activities of Daily Living      Permission Sought/Granted                  Emotional Assessment   Attitude/Demeanor/Rapport: Engaged Affect (typically observed): Appropriate Orientation: : Oriented to Self, Oriented to Place, Oriented to  Time, Oriented to Situation Alcohol / Substance Use:  Not Applicable Psych Involvement: No (comment)  Admission diagnosis:  SOB (shortness of breath) [R06.02] Elevated troponin [R77.8] NSTEMI (non-ST elevated myocardial infarction) ([place]) [I21.4] Chest pain [R07.9] COVID [U07.1] Patient Active Problem List   Diagnosis Date Noted   NSTEMI (non-ST elevated myocardial infarction) ([place]) 08/16/2021   COVID-19 virus infection 08/16/2021   Anemia due to chronic kidney disease 08/16/2021   Dyspepsia 07/01/2021   Long term (current) use of insulin ([place]) 07/01/2021   Vitamin B12 deficiency 07/01/2021   Vitamin D deficiency 07/01/2021   Above knee amputation of left lower extremity ([place]) 06/29/2021   Hyperglycemia due to type 2 diabetes mellitus ([place]) 06/29/2021   Hyperhomocysteinemia ([place]) 06/29/2021   Recurrent UTI 01/12/2021   Critical lower limb ischemia ([place]) 02/07/2020   Pleural effusion, bilateral 02/26/2019   History of non-ST elevation myocardial infarction (NSTEMI) 09/15/2018   Goals of care, counseling/discussion    Palliative care by specialist    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[facility])    Insulin-requiring or dependent type II diabetes mellitus ([place]) 01/11/2018   CAD in native artery    History of TIA (transient ischemic attack) 05/14/2015   Chronic combined systolic and diastolic heart failure ([place]) 05/14/2015   Hyperkalemia    Essential hypertension    Hyperlipidemia LDL goal <70    Anemia 05/10/2012   Anxiety 05/09/2012   Peripheral  vascular disease ([place])    PCP:  [name], MD Pharmacy:   [facility] [phone] - [address], [address] AT [address] [place] [address] Phone: [phone] Fax: [phone]  [facility] [phone] - [address], [address] AT [place] [address] Phone: [phone] Fax: [phone]  [facility] [address] [place]  [address] Phone: [phone] Fax: [phone]     Social Determinants of Health (SDOH) Interventions    Readmission Risk Interventions Readmission Risk Prevention Plan 08/19/2021  Transportation Screening Complete  PCP or Specialist Appt within 3-5 Days Complete  HRI or [place] Complete  Social Work Consult for [place] Planning/Counseling Complete  Palliative Care Screening Not Applicable  Medication Review Press photographer) Complete  Some recent data might be hidden
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
## 2021-08-19 NOTE — Consult Note (Addendum)
[facility] Nurse Consult Note: Patient receiving care in [place]. Patient is COVID +. Reason for Consult: Requesting InterDry for left groin Wound type: MASD-ITD Pressure Injury POA: Yes/No/NA Measurement: Wound bed: Drainage (amount, consistency, odor)  Periwound: Dressing procedure/placement/frequency: Use Kellie Simmering [phone] to order InterDry for the left groin. Measure and cut length of InterDry to fit in skin folds that have skin breakdown Tuck InterDry fabric into skin folds in a single layer, allow for 2 inches of overhang from skin edges to allow for wicking to occur May remove to bathe; dry area thoroughly and then tuck into affected areas again Do not apply any creams or ointments when using InterDry DO NOT THROW AWAY FOR 5 DAYS unless soiled with stool DO NOT [facility] product, this will inactivate the silver in the material  New sheet of Interdry should be applied after 5 days of use if patient continues to have skin breakdown  WOC nurse will not follow at this time.  Please re-consult the [facility] team if needed.  Val Riles, RN, MSN, CWOCN, CNS-BC, pager [phone]

## 2021-08-19 NOTE — Progress Notes (Signed)
Pt receives out-pt HD at [facility] in [facility] Spoke to [employer] at clinic to make sure that clinic is aware pt is covid positive. Clinic is aware and states pt can remain on MWF schedule. Pt's appt time is 10:25. Clinic aware pt will resume care tomorrow and has access to epic to obtain clinicals.    Melven Sartorius Renal Navigator [phone]

## 2021-08-19 NOTE — TOC Transition Note (Signed)
Transition of Care [facility]) - CM/SW Discharge Note   Patient Details  Name: KALEY JUTRAS MRN: [id number] Date of Birth: Apr 21, 1951  [facility]) CM/SW Contact:  Zenon Mayo, RN Phone Number: 08/19/2021, 1:16 PM   Clinical Narrative:    NCM spoke with patient, she states she has three people that stay with her at home, and they are bad about not answering the phone.  She states ptar can transport her home she has a key.  She is for dc today.  She has a w/chair at home.  NCM scheduled [facility] for transport. She is COVID positive   Final next level of care: Home/Self Care Barriers to Discharge: No Barriers Identified   Patient Goals and CMS Choice Patient states their goals for this hospitalization and ongoing recovery are:: return home   Choice offered to / list presented to : NA  Discharge Placement                       Discharge Plan and Services   Discharge Planning Services: CM Consult Post Acute Care Choice: NA            DME Agency: NA       HH Arranged: NA          Social Determinants of Health (SDOH) Interventions     Readmission Risk Interventions Readmission Risk Prevention Plan 08/19/2021  Transportation Screening Complete  PCP or Specialist Appt within 3-5 Days Complete  HRI or Home Care Consult Complete  Social Work Consult for [place] Planning/Counseling Complete  Palliative Care Screening Not Applicable  Medication Review Press photographer) Complete  Some recent data might be hidden

## 2021-08-19 NOTE — Progress Notes (Signed)
[facility] NEPHROLOGY PROGRESS NOTE  Assessment/ Plan: Pt is a 70 y.o. yo female  with history of hypertension, DM, HLD, CAD, peripheral vascular disease status post right AKA, anemia, arthritis, ESRD on HD MWF presented with chest pain and shortness of breath.  OP HD orders: NEPHROLOGIST: Cay Schillings MD, [facility], MWF, HD TIME: 210, 2K.   # Chest pain/NSTEMI: Seen by cardiologist.  Given diffuse CAD she is at high risk for coronary event.  Not a candidate for bypass surgery therefore trying to maximize medical therapy.  She is currently on heparin.  Chest pain-free.  Chest x-ray negative.Echo with EF of 20 to 25%, severely depressed LV function.  Plan for cardiac cath once recovered from [place].   # ESRD: MWF: [facility].  Received dialysis yesterday and tolerated well.  Plan for next HD tomorrow.  Volume status looks acceptable.   # Hypertension: Blood pressure acceptable.  Monitor blood pressure on cardiac medication.  Volume acceptable.    # Anemia of ESRD: Hemoglobin at goal on admission.  Hemoglobin is trending down.  Iron saturation 52%.  I will order ESA with dialysis.   # Metabolic Bone Disease: Corrected calcium and phosphorus level acceptable.  Continue Renvela.  #COVID-positive: Probably incidental finding.  On Molnupiravir chest x-ray negative.  Subjective: Seen and examined.  Denies nausea, vomiting, chest pain or shortness of breath today.  Tolerated dialysis well.  No new event.  Objective Vital signs in last 24 hours: Vitals:   08/19/21 0036 08/19/21 0403 08/19/21 0714 08/19/21 0800  BP: (!) 104/50 (!) 133/59 121/60 121/60  Pulse: 78 70  74  Resp: 14 17  15   Temp: 97.8 F (36.6 C) 98.2 F (36.8 C)  98.5 F (36.9 C)  TempSrc: Oral Oral  Oral  SpO2: 98% 98%  93%  Weight:  69.4 kg    Height:       Weight change: -1.3 kg  Intake/Output Summary (Last 24 hours) at 08/19/2021 0941 Last data filed at 08/19/2021 0403 Gross per 24 hour   Intake 163 ml  Output 700 ml  Net -537 ml        Labs: Basic Metabolic Panel: Recent Labs  Lab 08/16/21 2304 08/17/21 0645 08/18/21 0108 08/19/21 0054  NA 137 137 135 134*  K 4.6 4.5 3.8 3.6  CL 105 106 100 98  CO2 23 22 27 27   GLUCOSE 122* 81 145* 165*  BUN 56* 56* 24* 18  CREATININE 7.70* 7.70* 4.95* 4.03*  CALCIUM 8.1* 8.2* 8.0* 8.1*  PHOS 3.7  --   --   --     Liver Function Tests: Recent Labs  Lab 08/17/21 0645 08/18/21 0108 08/19/21 0054  AST 21 22 19   ALT 13 13 12   ALKPHOS 60 62 56  BILITOT 0.6 0.7 0.8  PROT 5.7* 5.7* 5.4*  ALBUMIN 2.8* 2.7* 2.6*    No results for input(s): LIPASE, AMYLASE in the last 168 hours. No results for input(s): AMMONIA in the last 168 hours. CBC: Recent Labs  Lab 08/16/21 1026 08/16/21 2304 08/17/21 0645 08/18/21 0108 08/18/21 1021 08/19/21 0054  WBC 4.6 5.6 4.4 5.6 6.0 7.6  NEUTROABS 2.9  --   --   --   --   --   HGB 10.5* 10.0* 10.2* 9.4* 9.2* 8.6*  HCT 34.7* 33.6* 34.1* 30.4* 30.2* 28.3*  MCV 100.0 99.4 98.8 96.8 96.8 96.9  PLT 171 165 172 129* 144* 135*    Cardiac Enzymes: No results for input(s):  CKTOTAL, CKMB, CKMBINDEX, TROPONINI in the last 168 hours. CBG: Recent Labs  Lab 08/18/21 0615 08/18/21 1427 08/18/21 1628 08/18/21 2114 08/19/21 0638  GLUCAP 140* 81 166* 143* 111*     Iron Studies:  Recent Labs    08/18/21 1021  IRON 113  TIBC 218*  FERRITIN 1,373*    Studies/Results: No results found.  Medications: Infusions:  sodium chloride      Scheduled Medications:  acetaminophen  500 mg Oral QHS   vitamin C  500 mg Oral Daily   aspirin  81 mg Oral Daily   atorvastatin  80 mg Oral Daily   carvedilol  3.125 mg Oral BID WC   cholecalciferol  1,000 Units Oral Daily   fluconazole  150 mg Oral Once   heparin injection (subcutaneous)  5,000 Units Subcutaneous Q8H   hydrALAZINE  10 mg Oral Q8H   insulin aspart  0-6 Units Subcutaneous TID WC   isosorbide mononitrate  15 mg Oral Daily    molnupiravir EUA  4 capsule Oral BID   pantoprazole  40 mg Oral Daily   sevelamer carbonate  800 mg Oral TID WC   sodium chloride flush  3 mL Intravenous Q12H   ticagrelor  90 mg Oral BID   zinc sulfate  220 mg Oral Daily    have reviewed scheduled and prn medications.  Physical Exam: General: Sitting on bed comfortable, not in distress Heart:RRR, s1s2 nl Lungs: Clear b/l, no crackle Abdomen:soft, Non-tender, non-distended Extremities: Right AKA, no edema Dialysis Access: Right upper extremity AV fistula has good thrill and bruit.  Suhayla Chisom Prasad Antavia Tandy 08/19/2021,9:41 AM  LOS: 2 days

## 2021-08-20 DIAGNOSIS — N2581 Secondary hyperparathyroidism of renal origin: Secondary | ICD-10-CM | POA: Diagnosis not present

## 2021-08-20 DIAGNOSIS — D509 Iron deficiency anemia, unspecified: Secondary | ICD-10-CM | POA: Diagnosis not present

## 2021-08-20 DIAGNOSIS — E1121 Type 2 diabetes mellitus with diabetic nephropathy: Secondary | ICD-10-CM | POA: Diagnosis not present

## 2021-08-20 DIAGNOSIS — N186 End stage renal disease: Secondary | ICD-10-CM | POA: Diagnosis not present

## 2021-08-20 DIAGNOSIS — D631 Anemia in chronic kidney disease: Secondary | ICD-10-CM | POA: Diagnosis not present

## 2021-08-23 DIAGNOSIS — D631 Anemia in chronic kidney disease: Secondary | ICD-10-CM | POA: Diagnosis not present

## 2021-08-23 DIAGNOSIS — N2581 Secondary hyperparathyroidism of renal origin: Secondary | ICD-10-CM | POA: Diagnosis not present

## 2021-08-23 DIAGNOSIS — N186 End stage renal disease: Secondary | ICD-10-CM | POA: Diagnosis not present

## 2021-08-23 DIAGNOSIS — D509 Iron deficiency anemia, unspecified: Secondary | ICD-10-CM | POA: Diagnosis not present

## 2021-08-23 DIAGNOSIS — E1121 Type 2 diabetes mellitus with diabetic nephropathy: Secondary | ICD-10-CM | POA: Diagnosis not present

## 2021-08-24 DIAGNOSIS — E559 Vitamin D deficiency, unspecified: Secondary | ICD-10-CM | POA: Diagnosis not present

## 2021-08-24 DIAGNOSIS — Z794 Long term (current) use of insulin: Secondary | ICD-10-CM | POA: Diagnosis not present

## 2021-08-24 DIAGNOSIS — D638 Anemia in other chronic diseases classified elsewhere: Secondary | ICD-10-CM | POA: Diagnosis not present

## 2021-08-24 DIAGNOSIS — Z89611 Acquired absence of right leg above knee: Secondary | ICD-10-CM | POA: Diagnosis not present

## 2021-08-24 DIAGNOSIS — F419 Anxiety disorder, unspecified: Secondary | ICD-10-CM | POA: Diagnosis not present

## 2021-08-24 DIAGNOSIS — I5042 Chronic combined systolic (congestive) and diastolic (congestive) heart failure: Secondary | ICD-10-CM | POA: Diagnosis not present

## 2021-08-24 DIAGNOSIS — I119 Hypertensive heart disease without heart failure: Secondary | ICD-10-CM | POA: Diagnosis not present

## 2021-08-24 DIAGNOSIS — Z2821 Immunization not carried out because of patient refusal: Secondary | ICD-10-CM | POA: Diagnosis not present

## 2021-08-24 DIAGNOSIS — E538 Deficiency of other specified B group vitamins: Secondary | ICD-10-CM | POA: Diagnosis not present

## 2021-08-24 DIAGNOSIS — Z0001 Encounter for general adult medical examination with abnormal findings: Secondary | ICD-10-CM | POA: Diagnosis not present

## 2021-08-24 DIAGNOSIS — I10 Essential (primary) hypertension: Secondary | ICD-10-CM | POA: Diagnosis not present

## 2021-08-24 DIAGNOSIS — E1165 Type 2 diabetes mellitus with hyperglycemia: Secondary | ICD-10-CM | POA: Diagnosis not present

## 2021-08-25 ENCOUNTER — Telehealth (HOSPITAL_BASED_OUTPATIENT_CLINIC_OR_DEPARTMENT_OTHER): Payer: Self-pay | Admitting: *Deleted

## 2021-08-25 DIAGNOSIS — E1121 Type 2 diabetes mellitus with diabetic nephropathy: Secondary | ICD-10-CM | POA: Diagnosis not present

## 2021-08-25 DIAGNOSIS — D509 Iron deficiency anemia, unspecified: Secondary | ICD-10-CM | POA: Diagnosis not present

## 2021-08-25 DIAGNOSIS — N186 End stage renal disease: Secondary | ICD-10-CM | POA: Diagnosis not present

## 2021-08-25 DIAGNOSIS — N2581 Secondary hyperparathyroidism of renal origin: Secondary | ICD-10-CM | POA: Diagnosis not present

## 2021-08-25 DIAGNOSIS — D631 Anemia in chronic kidney disease: Secondary | ICD-10-CM | POA: Diagnosis not present

## 2021-08-25 NOTE — Telephone Encounter (Signed)
   [facility] Pre-operative Risk Assessment    Patient Name: Jenna Brown  DOB: 04-Dec-1950 MRN: [id number]  HEARTCARE STAFF:  - IMPORTANT!!!!!! Under Visit Info/Reason for Call, type in Other and utilize the format Clearance MM/DD/YY or Clearance TBD. Do not use dashes or single digits. - Please review there is not already an duplicate clearance open for this procedure. - If request is for dental extraction, please clarify the # of teeth to be extracted. - If the patient is currently at the dentist's office, call Pre-Op Callback Staff (MA/nurse) to input urgent request.  - If the patient is not currently in the dentist office, please route to the Pre-Op pool.  Request for surgical clearance:  What type of surgery is being performed? Abdominal Aortagram w/bilateral lower extremity runoff + possible intervention  When is this surgery scheduled? TBD  What type of clearance is required (medical clearance vs. Pharmacy clearance to hold med vs. Both)? Medical  Are there any medications that need to be held prior to surgery and how long? [place] name and name of physician performing surgery? [facility] Monica Martinez  What is the office phone number? [phone]   7.   What is the office fax number?          [phone]  8.   Anesthesia type (None, local, MAC, general) ? Georgiann Hahn 08/25/2021, 2:10 PM  _________________________________________________________________   (provider comments below)

## 2021-08-26 NOTE — Telephone Encounter (Signed)
   Name: Jenna Brown  DOB: 10/30/1950  MRN: [id number]  Primary Cardiologist: Skeet Latch, MD  Chart reviewed as part of pre-operative protocol coverage. Because of [name] past medical history and time since last visit, she will require a follow-up visit in order to better assess preoperative cardiovascular risk. She was recently in the hospital with multiple issues including NSTEMI and Covid. EF was significantly worse well. Managed medically with question of future cath upon recovery. Needs close post-hospital cardiology follow-up; do not see this was arranged previously. Qualifies for needing TOC visit. Discharged 08/19/21.  Pre-op covering staff: - Please schedule appointment and call patient to inform them.  - Please contact requesting surgeon's office via preferred method (i.e, phone, fax) to inform them of need for appointment prior to surgery.  Given recent cardiac events this will likely warrant a conversation with primary cardiologist at time of her follow-up OV rather than pre-emptively routing question re: Brilinta before visit.   Charlie Pitter, PA-C  08/26/2021, 12:47 PM

## 2021-08-26 NOTE — Telephone Encounter (Signed)
S/w the pt about pre op appt needed. I scheduled the appt with the pt. Pt then asked if I could give the address and appt info to her sister Ruby. Ruby got on the phone and I advised of appt date, time and location [address], [address], [address] on [address]. Ruby states her sister using a transportation company. I then advised they will drop her off at the main entrance. I gave the office # [phone] if any questions. Pt and her sister Bertram Millard both thanked me for the call and the help. Appt has been made a little bit further, as to pt states she has covid and goes back Saturday for re-testing and that transportation won't take her until she has been cleared. I will forward notes to provider for appt. Will send FYI to requesting office pt has appt 09/09/21.

## 2021-08-27 DIAGNOSIS — N2581 Secondary hyperparathyroidism of renal origin: Secondary | ICD-10-CM | POA: Diagnosis not present

## 2021-08-27 DIAGNOSIS — D509 Iron deficiency anemia, unspecified: Secondary | ICD-10-CM | POA: Diagnosis not present

## 2021-08-27 DIAGNOSIS — E1121 Type 2 diabetes mellitus with diabetic nephropathy: Secondary | ICD-10-CM | POA: Diagnosis not present

## 2021-08-27 DIAGNOSIS — D631 Anemia in chronic kidney disease: Secondary | ICD-10-CM | POA: Diagnosis not present

## 2021-08-27 DIAGNOSIS — N186 End stage renal disease: Secondary | ICD-10-CM | POA: Diagnosis not present

## 2021-08-30 ENCOUNTER — Encounter (HOSPITAL_BASED_OUTPATIENT_CLINIC_OR_DEPARTMENT_OTHER): Payer: Self-pay | Admitting: *Deleted

## 2021-08-30 ENCOUNTER — Emergency Department (HOSPITAL_BASED_OUTPATIENT_CLINIC_OR_DEPARTMENT_OTHER)
Admission: EM | Admit: 2021-08-30 | Discharge: 2021-08-30 | Disposition: A | Discharge status: Home / Self Care | Payer: Medicare Other | Attending: Emergency Medicine | Admitting: Emergency Medicine

## 2021-08-30 ENCOUNTER — Other Ambulatory Visit: Payer: Self-pay

## 2021-08-30 DIAGNOSIS — Z7982 Long term (current) use of aspirin: Secondary | ICD-10-CM | POA: Insufficient documentation

## 2021-08-30 DIAGNOSIS — G546 Phantom limb syndrome with pain: Secondary | ICD-10-CM | POA: Diagnosis not present

## 2021-08-30 DIAGNOSIS — Z87891 Personal history of nicotine dependence: Secondary | ICD-10-CM | POA: Diagnosis not present

## 2021-08-30 DIAGNOSIS — Z8616 Personal history of COVID-19: Secondary | ICD-10-CM | POA: Insufficient documentation

## 2021-08-30 DIAGNOSIS — E1122 Type 2 diabetes mellitus with diabetic chronic kidney disease: Secondary | ICD-10-CM | POA: Diagnosis not present

## 2021-08-30 DIAGNOSIS — D509 Iron deficiency anemia, unspecified: Secondary | ICD-10-CM | POA: Diagnosis not present

## 2021-08-30 DIAGNOSIS — Z794 Long term (current) use of insulin: Secondary | ICD-10-CM | POA: Insufficient documentation

## 2021-08-30 DIAGNOSIS — I2510 Atherosclerotic heart disease of native coronary artery without angina pectoris: Secondary | ICD-10-CM | POA: Insufficient documentation

## 2021-08-30 DIAGNOSIS — N186 End stage renal disease: Secondary | ICD-10-CM | POA: Diagnosis not present

## 2021-08-30 DIAGNOSIS — I5042 Chronic combined systolic (congestive) and diastolic (congestive) heart failure: Secondary | ICD-10-CM | POA: Insufficient documentation

## 2021-08-30 DIAGNOSIS — I132 Hypertensive heart and chronic kidney disease with heart failure and with stage 5 chronic kidney disease, or end stage renal disease: Secondary | ICD-10-CM | POA: Insufficient documentation

## 2021-08-30 DIAGNOSIS — R000 Tachycardia, unspecified: Secondary | ICD-10-CM | POA: Insufficient documentation

## 2021-08-30 DIAGNOSIS — E1121 Type 2 diabetes mellitus with diabetic nephropathy: Secondary | ICD-10-CM | POA: Diagnosis not present

## 2021-08-30 DIAGNOSIS — Z992 Dependence on renal dialysis: Secondary | ICD-10-CM | POA: Insufficient documentation

## 2021-08-30 DIAGNOSIS — N2581 Secondary hyperparathyroidism of renal origin: Secondary | ICD-10-CM | POA: Diagnosis not present

## 2021-08-30 DIAGNOSIS — D631 Anemia in chronic kidney disease: Secondary | ICD-10-CM | POA: Diagnosis not present

## 2021-08-30 LAB — BASIC METABOLIC PANEL
Anion gap: 10 (ref 5–15)
BUN: 19 mg/dL (ref 8–23)
CO2: 26 mmol/L (ref 22–32)
Calcium: 8.4 mg/dL — ABNORMAL LOW (ref 8.9–10.3)
Chloride: 101 mmol/L (ref 98–111)
Creatinine, Ser: 3.34 mg/dL — ABNORMAL HIGH (ref 0.44–1.00)
GFR, Estimated: 14 mL/min — ABNORMAL LOW (ref >60)
Glucose, Bld: 119 mg/dL — ABNORMAL HIGH (ref 70–99)
Potassium: 3.8 mmol/L (ref 3.5–5.1)
Sodium: 137 mmol/L (ref 135–145)

## 2021-08-30 LAB — CBC
HCT: 32.4 % — ABNORMAL LOW (ref 36.0–46.0)
Hemoglobin: 10 g/dL — ABNORMAL LOW (ref 12.0–15.0)
MCH: 29.7 pg (ref 26.0–34.0)
MCHC: 30.9 g/dL (ref 30.0–36.0)
MCV: 96.1 fL (ref 80.0–100.0)
Platelets: 204 10*3/uL (ref 150–400)
RBC: 3.37 MIL/uL — ABNORMAL LOW (ref 3.87–5.11)
RDW: 15 % (ref 11.5–15.5)
WBC: 8 10*3/uL (ref 4.0–10.5)
nRBC: 0 % (ref 0.0–0.2)

## 2021-08-30 MED ORDER — OXYCODONE-ACETAMINOPHEN 5-325 MG PO TABS
1.0000 | ORAL_TABLET | Freq: Once | ORAL | Status: AC
  Administered 2021-08-30: 1 via ORAL
  Filled 2021-08-30: qty 1

## 2021-08-30 MED ORDER — OXYCODONE-ACETAMINOPHEN 5-325 MG PO TABS
1.0000 | ORAL_TABLET | Freq: Three times a day (TID) | ORAL | 0 refills | Status: DC | PRN
Start: 2021-08-30 — End: 2021-10-04

## 2021-08-30 NOTE — ED Triage Notes (Addendum)
Right leg pain. She has AKA. Phantom pain. States she needs Oxycodone. Her MD will not give her a Rx.

## 2021-08-30 NOTE — ED Notes (Signed)
Pt. Is here for stump pain in the R upper stump.  She reports she always come here for her pain and gets her oxycodone.  No noted skin breakdown and no noted skin discoloration in the R stump.

## 2021-08-30 NOTE — ED Provider Notes (Signed)
[place] EMERGENCY DEPARTMENT Provider Note   CSN: [id number] Arrival date & time: 08/30/21  1432     History Chief Complaint  Patient presents with   Leg Pain    Jenna Brown is a 70 y.o. female.   Leg Pain Associated symptoms: no back pain   Patient presents with phantom limb pain on right side.  History of same occasionally.  Most this time does not hurt but occasional flareup.  Will need pain medicine that time.  States her primary care doctor did not give her medicine.  States in between she does not need medicine.  States oxycodone works.  Reviewing records appears as if she had been seen in the ER in July for the same.  PCP had given some pain pills and may and had been seen December a year ago and gotten some pain medicines.  Usually only pain medicine she has been getting.  States she has not slept for 4 days due to the pain.  Denies chest pain states she has had 7 heart attacks.      Past Medical History:  Diagnosis Date   Anemia    Anxiety    takes Xanax prn   Arthritis    Cataract    left eye   CKD (chronic kidney disease), stage IV (HCC)    Coronary artery disease 2016   cath w/ 90% LAD, 95%CFX, 80% OM 2, 60% RCA, not CABG candidate, rx medically   Full dentures    GERD (gastroesophageal reflux disease)    H/O hiatal hernia    Headache(784.0)    when b/p is elevated   Hemorrhoids    Hx of AKA (above knee amputation), right (HCC)    Hyperlipidemia    takes Simvastatin daily   Hypertension    takes Amlodipine/HCTZ/Losartan daily   Migraines    Myocardial infarction ([place]) 1990's   NSTEMI (non-ST elevated myocardial infarction) ([place])    Peripheral vascular disease ([place])    PONV (postoperative nausea and vomiting)    Stroke ([place])    TIA history   Type II diabetes mellitus ([place])    on Lantus   Vertigo    takes Ativert prn    Patient Active Problem List   Diagnosis Date Noted   NSTEMI (non-ST elevated myocardial infarction) ([place])  08/16/2021   COVID-19 virus infection 08/16/2021   Anemia due to chronic kidney disease 08/16/2021   Dyspepsia 07/01/2021   Long term (current) use of insulin ([place]) 07/01/2021   Vitamin B12 deficiency 07/01/2021   Vitamin D deficiency 07/01/2021   Above knee amputation of left lower extremity ([place]) 06/29/2021   Hyperglycemia due to type 2 diabetes mellitus ([place]) 06/29/2021   Hyperhomocysteinemia ([place]) 06/29/2021   Recurrent UTI 01/12/2021   Critical lower limb ischemia ([place]) 02/07/2020   Pleural effusion, bilateral 02/26/2019   History of non-ST elevation myocardial infarction (NSTEMI) 09/15/2018   Goals of care, counseling/discussion    Palliative care by specialist    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[facility])    Insulin-requiring or dependent type II diabetes mellitus ([place]) 01/11/2018   CAD in native artery    History of TIA (transient ischemic attack) 05/14/2015   Chronic combined systolic and diastolic heart failure ([place]) 05/14/2015   Hyperkalemia    Essential hypertension    Hyperlipidemia LDL goal <70    Anemia 05/10/2012   Anxiety 05/09/2012   Peripheral vascular disease ([place])     Past Surgical History:  Procedure Laterality Date   ABDOMINAL AORTAGRAM  N/A 02/29/2012   Procedure: ABDOMINAL Maxcine Ham;  Surgeon: [name], MD;  Location: [facility] CATH LAB;  Service: Cardiovascular;  Laterality: N/A;   ABDOMINAL AORTOGRAM W/LOWER EXTREMITY N/A 02/11/2020   Procedure: ABDOMINAL AORTOGRAM W/LOWER EXTREMITY;  Surgeon: [name], MD;  Location: [place] CV LAB;  Service: Cardiovascular;  Laterality: N/A;   ABDOMINAL AORTOGRAM W/LOWER EXTREMITY N/A 03/17/2020   Procedure: ABDOMINAL AORTOGRAM W/LOWER EXTREMITY;  Surgeon: [name], MD;  Location: [place] CV LAB;  Service: Cardiovascular;  Laterality: N/A;   AMPUTATION  05/10/2012   Procedure: AMPUTATION ABOVE KNEE;  Surgeon: [name], MD;  Location: [facility] OR;  Service: Vascular;  Laterality: Right;   open right groin wound noted    aortogram  02/2012   AV FISTULA PLACEMENT Right 03/04/2019   Procedure: ARTERIOVENOUS (AV) FISTULA CREATION;  Surgeon: Rosetta Posner, MD;  Location: [place];  Service: Vascular;  Laterality: Right;   CARDIAC CATHETERIZATION N/A 04/13/2015   Procedure: Right/Left Heart Cath and Coronary Angiography;  Surgeon: [name], MD;  Location: [facility] CV LAB;  Service: Cardiovascular;  Laterality: N/A;   CLEFT PALATE REPAIR     several   COLONOSCOPY     DILATION AND CURETTAGE OF UTERUS     FEMORAL-POPLITEAL BYPASS GRAFT  04/05/2012   Procedure: BYPASS GRAFT FEMORAL-POPLITEAL ARTERY;  Surgeon: [name], MD;  Location: [place];  Service: Vascular;  Laterality: Right;  REVISION   FEMORAL-TIBIAL BYPASS GRAFT  03/29/2012   Procedure: BYPASS GRAFT FEMORAL-TIBIAL ARTERY;  Surgeon: [name], MD;  Location: [facility] OR;  Service: Vascular;  Laterality: Right;  Right femoral to Posterior Tibialis with composite graft of 52mm x 80 cm ringed gortex graft and saphenous vein  ,intraoperative arteriogram   FEMOROPOPLITEAL THROMBECTOMY / EMBOLECTOMY  05/09/2012   IR FLUORO GUIDE CV LINE RIGHT  03/01/2019   IR US GUIDE VASC ACCESS RIGHT  03/01/2019   MULTIPLE TOOTH EXTRACTIONS     MYOMECTOMY     PERIPHERAL VASCULAR INTERVENTION Left 03/17/2020   Procedure: PERIPHERAL VASCULAR INTERVENTION;  Surgeon: [name], MD;  Location: [place] CV LAB;  Service: Cardiovascular;  Laterality: Left;  popliteal   PR VEIN BYPASS GRAFT,AORTO-FEM-POP  05/27/11   Right SFA-Below knee Pop BP   REVISION OF ARTERIOVENOUS GORETEX GRAFT Right 08/21/2019   Procedure: REVISION OF ARTERIOVENOUS FISTULA RIGHT ARM;  Surgeon: Rosetta Posner, MD;  Location: [facility] OR;  Service: Vascular;  Laterality: Right;   TEE WITHOUT CARDIOVERSION N/A 01/03/2017   Procedure: TRANSESOPHAGEAL ECHOCARDIOGRAM (TEE);  Surgeon: Skeet Latch, MD;  Location: [place];  Service: Cardiovascular;  Laterality: N/A;   THORACENTESIS  02/27/2019        TONSILLECTOMY     TUBAL LIGATION  ~ 1990     OB History   No obstetric history on file.     Family History  Problem Relation Age of Onset   Cancer Mother        breast   Diabetes Father    Cancer Brother    Heart attack Maternal Grandmother    Heart attack Maternal Grandfather     Social History   Tobacco Use   Smoking status: Former    Packs/day: 0.25    Years: 40.00    Pack years: 10.00    Types: Cigarettes    Quit date: 02/27/2012    Years since quitting: 9.5   Smokeless tobacco: Never  Vaping Use   Vaping Use: Never used  Substance Use  Topics   Alcohol use: No   Drug use: No    Home Medications Prior to Admission medications   Medication Sig Start Date End Date Taking? Authorizing Provider  acetaminophen (TYLENOL) 500 MG tablet Take 500 mg by mouth at bedtime.    [provider]  ALPRAZolam Duanne Moron) 0.25 MG tablet Take 1 tablet (0.25 mg total) by mouth 3 (three) times daily as needed for anxiety. 01/03/17   Delos Haring, PA-C  aspirin 81 MG chewable tablet Chew 81 mg by mouth daily.    [provider]  atorvastatin (LIPITOR) 80 MG tablet Take 80 mg by mouth daily.  01/09/18   [provider]  carvedilol (COREG) 3.125 MG tablet Take 1 tablet (3.125 mg total) by mouth 2 (two) times daily with a meal. 08/19/21   Geradine Girt, DO  cholecalciferol (VITAMIN D) 25 MCG (1000 UT) tablet Take 1,000 Units by mouth daily.    [provider]  docusate sodium (COLACE) 100 MG capsule Take 100 mg by mouth daily as needed for mild constipation.    [provider]  hydrALAZINE (APRESOLINE) 10 MG tablet Take 1 tablet by mouth three times daily on non-dialysis days 08/19/21   Geradine Girt, DO  Insulin Glargine [facility]) 100 UNIT/ML Inject 22 Units into the skin at bedtime.    [provider]  isosorbide mononitrate (IMDUR) 30 MG 24 hr tablet Take 1/2 tablet (15 mg total) by mouth daily. 08/20/21   Geradine Girt, DO   nitroGLYCERIN (NITROSTAT) 0.4 MG SL tablet DISSOLVE 1 TABLET BY MOUTH UNDER THE TONGUE EVERY 5 MINUTES FOR 3 DOSES AS NEEDED FOR CHEST PAIN Patient taking differently: 0.4 mg every 5 (five) minutes as needed for chest pain. 04/28/21   Skeet Latch, MD  oxyCODONE-acetaminophen (PERCOCET/ROXICET) 5-325 MG tablet Take 1 tablet by mouth every 6 (six) hours as needed for severe pain. 04/11/21   Hayden Rasmussen, MD  RENVELA 800 MG tablet Take 800 mg by mouth 3 (three) times daily with meals.     [provider]  ticagrelor (BRILINTA) 90 MG TABS tablet Take 1 tablet (90 mg total) by mouth 2 (two) times daily. 07/13/20   [name], MD  zinc sulfate 220 (50 Zn) MG capsule Take 1 capsule (220 mg total) by mouth daily. 08/20/21   Geradine Girt, DO    Allergies    Plavix [clopidogrel bisulfate], Codeine, Losartan, and Tylenol with codeine #3 [acetaminophen-codeine]  Review of Systems   Review of Systems  Constitutional:  Negative for appetite change.  Respiratory:  Negative for shortness of breath.   Cardiovascular:  Negative for chest pain.  Gastrointestinal:  Negative for abdominal pain.  Musculoskeletal:  Negative for back pain.  Neurological:  Positive for weakness.       Right-sided above-the-knee amputation.   Physical Exam Updated Vital Signs BP (!) 155/92 (BP Location: Left Arm)   Pulse (!) 132   Temp 98.5 F (36.9 C) (Oral)   Resp 18   Ht 5\' 4"  (1.626 m)   Wt 69.4 kg   SpO2 99%   BMI 26.26 kg/m   Physical Exam Vitals and nursing note reviewed.  HENT:     Head: Normocephalic.  Eyes:     Pupils: Pupils are equal, round, and reactive to light.  Cardiovascular:     Rate and Rhythm: Tachycardia present.  Abdominal:     Tenderness: There is no abdominal tenderness.  Musculoskeletal:  General: No tenderness.     Cervical back: Neck supple.     Comments: Right-sided above-the-knee amputation.  Skin:    Capillary Refill: Capillary refill takes less  than 2 seconds.  Neurological:     Mental Status: She is alert and oriented to person, place, and time.    ED Results / Procedures / Treatments   Labs (all labs ordered are listed, but only abnormal results are displayed) Labs Reviewed - No data to display  EKG None  Radiology No results found.  Procedures Procedures   Medications Ordered in ED Medications  oxyCODONE-acetaminophen (PERCOCET/ROXICET) 5-325 MG per tablet 1 tablet (has no administration in time range)    ED Course  I have reviewed the triage vital signs and the nursing notes.  Pertinent labs & imaging results that were available during my care of the patient were reviewed by me and considered in my medical decision making (see chart for details).    MDM Rules/Calculators/A&P                          Patient presents with phantom pain in her right lower extremity.  History of same.  Comes and goes.  Not usually on pain medicines for it.  States she needs a little oxycodone.  Reviewing records patient has had only 3 prescriptions over the last year.  1 from PCP and 2 from the ER.  States her PCP will not prescribe unless she is actively hurting and has not been hurting when she saw him.  Given pill here due to tachycardia.  Patient feels much better.  However does have tachycardia around 130.  Appears sinus.  Patient states she did not take her Coreg.  I think this is likely the cause.  Lab work otherwise reassuring.  Did dialysis today.  States they did not mention that her heart rate was going fast but they did have some issues with some hypotension which is not unusual for her.  We will take her Coreg and will follow-up tomorrow with PCP or cardiology for recheck.  Will discharge home. Given a few pills of oxycodone to help with the pain but instructed that the ER is not the place to get these  Final Clinical Impression(s) / ED Diagnoses Final diagnoses:  Phantom limb pain [facility])    Rx / DC Orders ED Discharge  Orders     None        Davonna Belling, MD 08/30/21 [phone]

## 2021-08-30 NOTE — ED Notes (Signed)
Pt. HR is elevated due to what she said is pain.  Pt. HR is 127 she has had oxycodone.

## 2021-08-30 NOTE — Discharge Instructions (Addendum)
Take the Coreg that you had not been taking.  Follow-up with your doctor.  Return for worsening symptoms shortness of breath or chest pain

## 2021-08-30 NOTE — ED Notes (Addendum)
Pt. Did go to dialysis today

## 2021-08-31 ENCOUNTER — Ambulatory Visit (HOSPITAL_BASED_OUTPATIENT_CLINIC_OR_DEPARTMENT_OTHER): Payer: Medicare Other | Admitting: Family

## 2021-09-01 DIAGNOSIS — E1121 Type 2 diabetes mellitus with diabetic nephropathy: Secondary | ICD-10-CM | POA: Diagnosis not present

## 2021-09-01 DIAGNOSIS — D631 Anemia in chronic kidney disease: Secondary | ICD-10-CM | POA: Diagnosis not present

## 2021-09-01 DIAGNOSIS — N2581 Secondary hyperparathyroidism of renal origin: Secondary | ICD-10-CM | POA: Diagnosis not present

## 2021-09-01 DIAGNOSIS — N186 End stage renal disease: Secondary | ICD-10-CM | POA: Diagnosis not present

## 2021-09-01 DIAGNOSIS — D509 Iron deficiency anemia, unspecified: Secondary | ICD-10-CM | POA: Diagnosis not present

## 2021-09-03 DIAGNOSIS — N186 End stage renal disease: Secondary | ICD-10-CM | POA: Diagnosis not present

## 2021-09-03 DIAGNOSIS — N2581 Secondary hyperparathyroidism of renal origin: Secondary | ICD-10-CM | POA: Diagnosis not present

## 2021-09-03 DIAGNOSIS — E1121 Type 2 diabetes mellitus with diabetic nephropathy: Secondary | ICD-10-CM | POA: Diagnosis not present

## 2021-09-03 DIAGNOSIS — D509 Iron deficiency anemia, unspecified: Secondary | ICD-10-CM | POA: Diagnosis not present

## 2021-09-03 DIAGNOSIS — D631 Anemia in chronic kidney disease: Secondary | ICD-10-CM | POA: Diagnosis not present

## 2021-09-06 DIAGNOSIS — E1121 Type 2 diabetes mellitus with diabetic nephropathy: Secondary | ICD-10-CM | POA: Diagnosis not present

## 2021-09-06 DIAGNOSIS — D631 Anemia in chronic kidney disease: Secondary | ICD-10-CM | POA: Diagnosis not present

## 2021-09-06 DIAGNOSIS — N2581 Secondary hyperparathyroidism of renal origin: Secondary | ICD-10-CM | POA: Diagnosis not present

## 2021-09-06 DIAGNOSIS — N186 End stage renal disease: Secondary | ICD-10-CM | POA: Diagnosis not present

## 2021-09-06 DIAGNOSIS — D509 Iron deficiency anemia, unspecified: Secondary | ICD-10-CM | POA: Diagnosis not present

## 2021-09-08 DIAGNOSIS — E1121 Type 2 diabetes mellitus with diabetic nephropathy: Secondary | ICD-10-CM | POA: Diagnosis not present

## 2021-09-08 DIAGNOSIS — N186 End stage renal disease: Secondary | ICD-10-CM | POA: Diagnosis not present

## 2021-09-08 DIAGNOSIS — D631 Anemia in chronic kidney disease: Secondary | ICD-10-CM | POA: Diagnosis not present

## 2021-09-08 DIAGNOSIS — N2581 Secondary hyperparathyroidism of renal origin: Secondary | ICD-10-CM | POA: Diagnosis not present

## 2021-09-08 DIAGNOSIS — D509 Iron deficiency anemia, unspecified: Secondary | ICD-10-CM | POA: Diagnosis not present

## 2021-09-09 ENCOUNTER — Ambulatory Visit (HOSPITAL_BASED_OUTPATIENT_CLINIC_OR_DEPARTMENT_OTHER): Payer: Medicare Other | Admitting: Family

## 2021-09-09 DIAGNOSIS — Z20822 Contact with and (suspected) exposure to covid-19: Secondary | ICD-10-CM | POA: Diagnosis not present

## 2021-09-09 NOTE — Progress Notes (Deleted)
Office Visit    Patient Name: Jenna Brown Date of Encounter: 09/09/2021  PCP:  [name], MD   [place]  Cardiologist:  Skeet Latch, MD  Advanced Practice Provider:  No care team member to display Electrophysiologist:  None      Chief Complaint    Jenna Brown is a 70 y.o. female with a hx of  CAD, PAD s/p right AKA 04/2012 and left SFA/popliteal stent, ESRD on HD MWF, HTN, HFrEF, DM2  presents today for preop clearance   Past Medical History    Past Medical History:  Diagnosis Date   Anemia    Anxiety    takes Xanax prn   Arthritis    Cataract    left eye   CKD (chronic kidney disease), stage IV ([place])    Coronary artery disease 2016   cath w/ 90% LAD, 95%CFX, 80% OM 2, 60% RCA, not CABG candidate, rx medically   Full dentures    GERD (gastroesophageal reflux disease)    H/O hiatal hernia    Headache(784.0)    when b/p is elevated   Hemorrhoids    Hx of AKA (above knee amputation), right (HCC)    Hyperlipidemia    takes Simvastatin daily   Hypertension    takes Amlodipine/HCTZ/Losartan daily   Migraines    Myocardial infarction [facility]) 1990's   NSTEMI (non-ST elevated myocardial infarction) ([place])    Peripheral vascular disease ([place])    PONV (postoperative nausea and vomiting)    Stroke ([place])    TIA history   Type II diabetes mellitus ([place])    on Lantus   Vertigo    takes Ativert prn   Past Surgical History:  Procedure Laterality Date   ABDOMINAL AORTAGRAM N/A 02/29/2012   Procedure: ABDOMINAL Maxcine Ham;  Surgeon: [name], MD;  Location[facility] CATH LAB;  Service: Cardiovascular;  Laterality: N/A;   ABDOMINAL AORTOGRAM W/LOWER EXTREMITY N/A 02/11/2020   Procedure: ABDOMINAL AORTOGRAM W/LOWER EXTREMITY;  Surgeon: [name], MD;  Location: [place] CV LAB;  Service: Cardiovascular;  Laterality: N/A;   ABDOMINAL AORTOGRAM W/LOWER EXTREMITY N/A 03/17/2020   Procedure: ABDOMINAL AORTOGRAM W/LOWER EXTREMITY;   Surgeon: [name], MD;  Location: [place] CV LAB;  Service: Cardiovascular;  Laterality: N/A;   AMPUTATION  05/10/2012   Procedure: AMPUTATION ABOVE KNEE;  Surgeon: [name], MD;  Location:  [facility] OR;  Service: Vascular;  Laterality: Right;   open right groin wound noted   aortogram  02/2012   AV FISTULA PLACEMENT Right 03/04/2019   Procedure: ARTERIOVENOUS (AV) FISTULA CREATION;  Surgeon: Rosetta Posner, MD;  Location: [place];  Service: Vascular;  Laterality: Right;   CARDIAC CATHETERIZATION N/A 04/13/2015   Procedure: Right/Left Heart Cath and Coronary Angiography;  Surgeon: [name], MD;  Location: [place] CV LAB;  Service: Cardiovascular;  Laterality: N/A;   CLEFT PALATE REPAIR     several   COLONOSCOPY     DILATION AND CURETTAGE OF UTERUS     FEMORAL-POPLITEAL BYPASS GRAFT  04/05/2012   Procedure: BYPASS GRAFT FEMORAL-POPLITEAL ARTERY;  Surgeon: [name], MD;  Location: [place];  Service: Vascular;  Laterality: Right;  REVISION   FEMORAL-TIBIAL BYPASS GRAFT  03/29/2012   Procedure: BYPASS GRAFT FEMORAL-TIBIAL ARTERY;  Surgeon: [name], MD;  Location: [place] OR;  Service: Vascular;  Laterality: Right;  Right femoral to Posterior Tibialis with composite graft of 46mm x 80 cm ringed gortex graft  and saphenous vein  ,intraoperative arteriogram   FEMOROPOPLITEAL THROMBECTOMY / EMBOLECTOMY  05/09/2012   IR FLUORO GUIDE CV LINE RIGHT  03/01/2019   IR US GUIDE VASC ACCESS RIGHT  03/01/2019   MULTIPLE TOOTH EXTRACTIONS     MYOMECTOMY     PERIPHERAL VASCULAR INTERVENTION Left 03/17/2020   Procedure: PERIPHERAL VASCULAR INTERVENTION;  Surgeon: [name], MD;  Location: [place] CV LAB;  Service: Cardiovascular;  Laterality: Left;  popliteal   PR VEIN BYPASS GRAFT,AORTO-FEM-POP  05/27/11   Right SFA-Below knee Pop BP   REVISION OF ARTERIOVENOUS GORETEX GRAFT Right 08/21/2019   Procedure: REVISION OF ARTERIOVENOUS FISTULA RIGHT ARM;  Surgeon: Rosetta Posner, MD;   [facility] OR;  Service: Vascular;  Laterality: Right;   TEE WITHOUT CARDIOVERSION N/A 01/03/2017   Procedure: TRANSESOPHAGEAL ECHOCARDIOGRAM (TEE);  Surgeon: Skeet Latch, MD;  Location: [place];  Service: Cardiovascular;  Laterality: N/A;   THORACENTESIS  02/27/2019       TONSILLECTOMY     TUBAL LIGATION  ~ 1990    Allergies  Allergies  Allergen Reactions   Plavix [Clopidogrel Bisulfate] Itching   Codeine Other (See Comments)    "makes me feel strange"   Losartan Other (See Comments)   Tylenol With Codeine #3 [Acetaminophen-Codeine] Other (See Comments)    "doesn't make me feel right"    History of Present Illness    Jenna Brown is a 70 y.o. female with a hx of CAD, PAD s/p right AKA 04/2012 and left SFA/popliteal stent, ESRD on HD MWF, HTN, DM2, HFrEF*** last seen while hospitalized.    Cardiac cath 2016 with multivessel CAD including occluded LAD. RCA fills via LAD collaterals. She has been medically managed since that time.  She is not a candidate for CABG.  She had repeat admission with NSTEMI 03/2017 the setting of not taking her Brilinta.  She was medically managed due to kidney disease.  She had recurrent NSTEMI 4/19, 8/19.  Echo 11/19 with LVEF 45-50%, hypokinesis of a couple inferior lateral, inferior, inferior septal as well as apical myocardium. She was admitted 6/20 with chest pain and heart failure and had self discontinued Brilinta due to shortness of breath.  She was started on dialysis at that time.  Admitted 07/2021 with COVID. Echo 08/17/21 LVEF 20-25%, wall motion abnormalities consistent with multivessel CAD, trivial MR. Elevated troponin thought to be due to COVID infection.   Has upcoming abdominal aortagram with bilateral lower extremity runoff with possible intervention with Dr. Carlis Abbott.   She presents today for follow up. ***  EKGs/Labs/Other Studies Reviewed:   The following studies were reviewed today:  Echo 08/17/21  1. The anterolateral  wall is akinetic. The mid and distal septum, entire  apex, and mid inferoseptum are akinetic. All other segments are  hypokinetic. Findings consistent with multivessel CAD. No LV thrombus on  contrast imaging. Left ventricular ejection   fraction, by estimation, is 20 to 25%. The left ventricle has severely  decreased function. The left ventricle demonstrates regional wall motion  abnormalities (see scoring diagram/findings for description).  Indeterminate diastolic filling due to E-A  fusion.   2. Right ventricular systolic function is normal. The right ventricular  size is normal. Tricuspid regurgitation signal is inadequate for assessing  PA pressure.   3. The mitral valve is degenerative. Trivial mitral valve regurgitation.  No evidence of mitral stenosis.   4. Focal calcification of the LCC noted. The aortic valve is tricuspid.  There is mild calcification of  the aortic valve. Aortic valve  regurgitation is not visualized. Aortic valve sclerosis/calcification is  present, without any evidence of aortic  stenosis.   5. The inferior vena cava is normal in size with greater than 50%  respiratory variability, suggesting right atrial pressure of 3 mmHg.   EKG:  EKG is *** ordered today.  The ekg ordered today demonstrates ***  Recent Labs: 08/16/2021: B Natriuretic Peptide 517.6; Magnesium 2.0 08/19/2021: ALT 12 08/30/2021: BUN 19; Creatinine, Ser 3.34; Hemoglobin 10.0; Platelets 204; Potassium 3.8; Sodium 137  Recent Lipid Panel    Component Value Date/Time   CHOL 136 08/17/2021 0645   TRIG 101 08/17/2021 0645   HDL 48 08/17/2021 0645   CHOLHDL 2.8 08/17/2021 0645   VLDL 20 08/17/2021 0645   LDLCALC 68 08/17/2021 0645     Home Medications   No outpatient medications have been marked as taking for the 09/09/21 encounter (Appointment) with Loel Dubonnet, NP.     Review of Systems      All other systems reviewed and are otherwise negative except as noted  above.  Physical Exam    VS:  There were no vitals taken for this visit. , BMI There is no height or weight on file to calculate BMI.  Wt Readings from Last 3 Encounters:  08/30/21 153 lb (69.4 kg)  08/19/21 153 lb (69.4 kg)  11/10/20 152 lb (68.9 kg)     GEN: Well nourished, well developed, in no acute distress. HEENT: normal. Neck: Supple, no JVD, carotid bruits, or masses. Cardiac: ***RRR, no murmurs, rubs, or gallops. No clubbing, cyanosis, edema.  ***Radials/PT 2+ and equal bilaterally.  Respiratory:  ***Respirations regular and unlabored, clear to auscultation bilaterally. GI: Soft, nontender, nondistended. MS: No deformity or atrophy. Skin: Warm and dry, no rash. Neuro:  Strength and sensation are intact. Psych: Normal affect.  Assessment & Plan    CAD -  ESRD on HD -  PAD -  HLD, LDL goal <70 -   Disposition: Follow up {follow up:15908} with Skeet Latch, MD or APP.  Signed, Loel Dubonnet, NP 09/09/2021, 1:25 PM [facility]

## 2021-09-10 DIAGNOSIS — N186 End stage renal disease: Secondary | ICD-10-CM | POA: Diagnosis not present

## 2021-09-10 DIAGNOSIS — N2581 Secondary hyperparathyroidism of renal origin: Secondary | ICD-10-CM | POA: Diagnosis not present

## 2021-09-10 DIAGNOSIS — D631 Anemia in chronic kidney disease: Secondary | ICD-10-CM | POA: Diagnosis not present

## 2021-09-10 DIAGNOSIS — D509 Iron deficiency anemia, unspecified: Secondary | ICD-10-CM | POA: Diagnosis not present

## 2021-09-10 DIAGNOSIS — E1121 Type 2 diabetes mellitus with diabetic nephropathy: Secondary | ICD-10-CM | POA: Diagnosis not present

## 2021-09-14 ENCOUNTER — Inpatient Hospital Stay (HOSPITAL_COMMUNITY): Payer: Medicare Other

## 2021-09-14 ENCOUNTER — Encounter (HOSPITAL_COMMUNITY)
Admission: EM | Disposition: A | Discharge status: Hospice | Payer: Self-pay | Source: Home / Self Care | Attending: Neurology

## 2021-09-14 ENCOUNTER — Emergency Department (HOSPITAL_COMMUNITY): Payer: Medicare Other

## 2021-09-14 ENCOUNTER — Encounter (HOSPITAL_COMMUNITY): Payer: Self-pay | Admitting: Neurology

## 2021-09-14 ENCOUNTER — Emergency Department (HOSPITAL_COMMUNITY): Payer: Medicare Other | Admitting: Certified Registered Nurse Anesthetist

## 2021-09-14 ENCOUNTER — Other Ambulatory Visit: Payer: Self-pay

## 2021-09-14 ENCOUNTER — Inpatient Hospital Stay (HOSPITAL_COMMUNITY)
Admission: EM | Admit: 2021-09-14 | Discharge: 2021-10-04 | DRG: 023 | Disposition: A | Discharge status: Hospice | Payer: Medicare Other | Attending: Internal Medicine | Admitting: Internal Medicine

## 2021-09-14 ENCOUNTER — Other Ambulatory Visit (HOSPITAL_COMMUNITY): Payer: Self-pay

## 2021-09-14 DIAGNOSIS — Z7982 Long term (current) use of aspirin: Secondary | ICD-10-CM

## 2021-09-14 DIAGNOSIS — L89156 Pressure-induced deep tissue damage of sacral region: Secondary | ICD-10-CM | POA: Diagnosis present

## 2021-09-14 DIAGNOSIS — Z87891 Personal history of nicotine dependence: Secondary | ICD-10-CM | POA: Diagnosis not present

## 2021-09-14 DIAGNOSIS — Z87730 Personal history of (corrected) cleft lip and palate: Secondary | ICD-10-CM

## 2021-09-14 DIAGNOSIS — E785 Hyperlipidemia, unspecified: Secondary | ICD-10-CM | POA: Diagnosis present

## 2021-09-14 DIAGNOSIS — G936 Cerebral edema: Secondary | ICD-10-CM

## 2021-09-14 DIAGNOSIS — Z992 Dependence on renal dialysis: Secondary | ICD-10-CM | POA: Diagnosis not present

## 2021-09-14 DIAGNOSIS — E1152 Type 2 diabetes mellitus with diabetic peripheral angiopathy with gangrene: Secondary | ICD-10-CM | POA: Diagnosis present

## 2021-09-14 DIAGNOSIS — I120 Hypertensive chronic kidney disease with stage 5 chronic kidney disease or end stage renal disease: Secondary | ICD-10-CM | POA: Diagnosis not present

## 2021-09-14 DIAGNOSIS — I63522 Cerebral infarction due to unspecified occlusion or stenosis of left anterior cerebral artery: Secondary | ICD-10-CM | POA: Diagnosis not present

## 2021-09-14 DIAGNOSIS — J9690 Respiratory failure, unspecified, unspecified whether with hypoxia or hypercapnia: Secondary | ICD-10-CM | POA: Diagnosis not present

## 2021-09-14 DIAGNOSIS — L8990 Pressure ulcer of unspecified site, unspecified stage: Secondary | ICD-10-CM | POA: Diagnosis present

## 2021-09-14 DIAGNOSIS — I6789 Other cerebrovascular disease: Secondary | ICD-10-CM

## 2021-09-14 DIAGNOSIS — I63431 Cerebral infarction due to embolism of right posterior cerebral artery: Secondary | ICD-10-CM | POA: Diagnosis not present

## 2021-09-14 DIAGNOSIS — R159 Full incontinence of feces: Secondary | ICD-10-CM | POA: Diagnosis not present

## 2021-09-14 DIAGNOSIS — Z79899 Other long term (current) drug therapy: Secondary | ICD-10-CM

## 2021-09-14 DIAGNOSIS — U071 COVID-19: Secondary | ICD-10-CM | POA: Diagnosis not present

## 2021-09-14 DIAGNOSIS — E1165 Type 2 diabetes mellitus with hyperglycemia: Secondary | ICD-10-CM | POA: Diagnosis present

## 2021-09-14 DIAGNOSIS — E1122 Type 2 diabetes mellitus with diabetic chronic kidney disease: Secondary | ICD-10-CM | POA: Diagnosis present

## 2021-09-14 DIAGNOSIS — I952 Hypotension due to drugs: Secondary | ICD-10-CM | POA: Diagnosis not present

## 2021-09-14 DIAGNOSIS — R600 Localized edema: Secondary | ICD-10-CM | POA: Diagnosis not present

## 2021-09-14 DIAGNOSIS — E871 Hypo-osmolality and hyponatremia: Secondary | ICD-10-CM | POA: Diagnosis not present

## 2021-09-14 DIAGNOSIS — Z89611 Acquired absence of right leg above knee: Secondary | ICD-10-CM | POA: Diagnosis not present

## 2021-09-14 DIAGNOSIS — I5042 Chronic combined systolic (congestive) and diastolic (congestive) heart failure: Secondary | ICD-10-CM | POA: Diagnosis present

## 2021-09-14 DIAGNOSIS — T447X5A Adverse effect of beta-adrenoreceptor antagonists, initial encounter: Secondary | ICD-10-CM | POA: Diagnosis not present

## 2021-09-14 DIAGNOSIS — R54 Age-related physical debility: Secondary | ICD-10-CM | POA: Diagnosis present

## 2021-09-14 DIAGNOSIS — I252 Old myocardial infarction: Secondary | ICD-10-CM

## 2021-09-14 DIAGNOSIS — I132 Hypertensive heart and chronic kidney disease with heart failure and with stage 5 chronic kidney disease, or end stage renal disease: Secondary | ICD-10-CM | POA: Diagnosis not present

## 2021-09-14 DIAGNOSIS — G9520 Unspecified cord compression: Secondary | ICD-10-CM | POA: Diagnosis not present

## 2021-09-14 DIAGNOSIS — R29810 Facial weakness: Secondary | ICD-10-CM | POA: Diagnosis present

## 2021-09-14 DIAGNOSIS — L89322 Pressure ulcer of left buttock, stage 2: Secondary | ICD-10-CM | POA: Diagnosis not present

## 2021-09-14 DIAGNOSIS — D631 Anemia in chronic kidney disease: Secondary | ICD-10-CM | POA: Diagnosis present

## 2021-09-14 DIAGNOSIS — Z23 Encounter for immunization: Secondary | ICD-10-CM

## 2021-09-14 DIAGNOSIS — Z515 Encounter for palliative care: Secondary | ICD-10-CM | POA: Diagnosis not present

## 2021-09-14 DIAGNOSIS — Z95820 Peripheral vascular angioplasty status with implants and grafts: Secondary | ICD-10-CM | POA: Diagnosis not present

## 2021-09-14 DIAGNOSIS — Z66 Do not resuscitate: Secondary | ICD-10-CM | POA: Diagnosis not present

## 2021-09-14 DIAGNOSIS — R29818 Other symptoms and signs involving the nervous system: Secondary | ICD-10-CM | POA: Diagnosis not present

## 2021-09-14 DIAGNOSIS — J9600 Acute respiratory failure, unspecified whether with hypoxia or hypercapnia: Secondary | ICD-10-CM | POA: Diagnosis not present

## 2021-09-14 DIAGNOSIS — I959 Hypotension, unspecified: Secondary | ICD-10-CM | POA: Diagnosis not present

## 2021-09-14 DIAGNOSIS — K6389 Other specified diseases of intestine: Secondary | ICD-10-CM | POA: Diagnosis not present

## 2021-09-14 DIAGNOSIS — Z8673 Personal history of transient ischemic attack (TIA), and cerebral infarction without residual deficits: Secondary | ICD-10-CM | POA: Diagnosis not present

## 2021-09-14 DIAGNOSIS — T40415A Adverse effect of fentanyl or fentanyl analogs, initial encounter: Secondary | ICD-10-CM | POA: Diagnosis not present

## 2021-09-14 DIAGNOSIS — I69318 Other symptoms and signs involving cognitive functions following cerebral infarction: Secondary | ICD-10-CM | POA: Diagnosis not present

## 2021-09-14 DIAGNOSIS — E1151 Type 2 diabetes mellitus with diabetic peripheral angiopathy without gangrene: Secondary | ICD-10-CM | POA: Diagnosis not present

## 2021-09-14 DIAGNOSIS — I6602 Occlusion and stenosis of left middle cerebral artery: Secondary | ICD-10-CM | POA: Diagnosis not present

## 2021-09-14 DIAGNOSIS — K219 Gastro-esophageal reflux disease without esophagitis: Secondary | ICD-10-CM | POA: Diagnosis present

## 2021-09-14 DIAGNOSIS — L89222 Pressure ulcer of left hip, stage 2: Secondary | ICD-10-CM | POA: Diagnosis not present

## 2021-09-14 DIAGNOSIS — R531 Weakness: Secondary | ICD-10-CM | POA: Diagnosis not present

## 2021-09-14 DIAGNOSIS — I700 Atherosclerosis of aorta: Secondary | ICD-10-CM | POA: Diagnosis not present

## 2021-09-14 DIAGNOSIS — J150 Pneumonia due to Klebsiella pneumoniae: Secondary | ICD-10-CM | POA: Diagnosis present

## 2021-09-14 DIAGNOSIS — I1311 Hypertensive heart and chronic kidney disease without heart failure, with stage 5 chronic kidney disease, or end stage renal disease: Secondary | ICD-10-CM | POA: Diagnosis not present

## 2021-09-14 DIAGNOSIS — I10 Essential (primary) hypertension: Secondary | ICD-10-CM | POA: Diagnosis not present

## 2021-09-14 DIAGNOSIS — R0902 Hypoxemia: Secondary | ICD-10-CM | POA: Diagnosis not present

## 2021-09-14 DIAGNOSIS — R471 Dysarthria and anarthria: Secondary | ICD-10-CM | POA: Diagnosis present

## 2021-09-14 DIAGNOSIS — Z9911 Dependence on respirator [ventilator] status: Secondary | ICD-10-CM | POA: Diagnosis not present

## 2021-09-14 DIAGNOSIS — N186 End stage renal disease: Secondary | ICD-10-CM

## 2021-09-14 DIAGNOSIS — Z803 Family history of malignant neoplasm of breast: Secondary | ICD-10-CM

## 2021-09-14 DIAGNOSIS — M419 Scoliosis, unspecified: Secondary | ICD-10-CM | POA: Diagnosis not present

## 2021-09-14 DIAGNOSIS — I70262 Atherosclerosis of native arteries of extremities with gangrene, left leg: Secondary | ICD-10-CM | POA: Diagnosis not present

## 2021-09-14 DIAGNOSIS — J9601 Acute respiratory failure with hypoxia: Secondary | ICD-10-CM | POA: Diagnosis present

## 2021-09-14 DIAGNOSIS — R29724 NIHSS score 24: Secondary | ICD-10-CM | POA: Diagnosis not present

## 2021-09-14 DIAGNOSIS — M79672 Pain in left foot: Secondary | ICD-10-CM | POA: Diagnosis not present

## 2021-09-14 DIAGNOSIS — J90 Pleural effusion, not elsewhere classified: Secondary | ICD-10-CM | POA: Diagnosis not present

## 2021-09-14 DIAGNOSIS — I63231 Cerebral infarction due to unspecified occlusion or stenosis of right carotid arteries: Secondary | ICD-10-CM | POA: Diagnosis not present

## 2021-09-14 DIAGNOSIS — Z452 Encounter for adjustment and management of vascular access device: Secondary | ICD-10-CM | POA: Diagnosis not present

## 2021-09-14 DIAGNOSIS — Z781 Physical restraint status: Secondary | ICD-10-CM

## 2021-09-14 DIAGNOSIS — I639 Cerebral infarction, unspecified: Secondary | ICD-10-CM | POA: Diagnosis present

## 2021-09-14 DIAGNOSIS — N250 Renal osteodystrophy: Secondary | ICD-10-CM | POA: Diagnosis not present

## 2021-09-14 DIAGNOSIS — Z431 Encounter for attention to gastrostomy: Secondary | ICD-10-CM | POA: Diagnosis not present

## 2021-09-14 DIAGNOSIS — Z7902 Long term (current) use of antithrombotics/antiplatelets: Secondary | ICD-10-CM

## 2021-09-14 DIAGNOSIS — I63511 Cerebral infarction due to unspecified occlusion or stenosis of right middle cerebral artery: Secondary | ICD-10-CM | POA: Diagnosis not present

## 2021-09-14 DIAGNOSIS — J9602 Acute respiratory failure with hypercapnia: Secondary | ICD-10-CM | POA: Diagnosis not present

## 2021-09-14 DIAGNOSIS — I2510 Atherosclerotic heart disease of native coronary artery without angina pectoris: Secondary | ICD-10-CM | POA: Diagnosis present

## 2021-09-14 DIAGNOSIS — I255 Ischemic cardiomyopathy: Secondary | ICD-10-CM | POA: Diagnosis not present

## 2021-09-14 DIAGNOSIS — G8194 Hemiplegia, unspecified affecting left nondominant side: Secondary | ICD-10-CM | POA: Diagnosis present

## 2021-09-14 DIAGNOSIS — Z794 Long term (current) use of insulin: Secondary | ICD-10-CM | POA: Diagnosis not present

## 2021-09-14 DIAGNOSIS — R4781 Slurred speech: Secondary | ICD-10-CM | POA: Diagnosis present

## 2021-09-14 DIAGNOSIS — L03116 Cellulitis of left lower limb: Secondary | ICD-10-CM | POA: Diagnosis not present

## 2021-09-14 DIAGNOSIS — Z7401 Bed confinement status: Secondary | ICD-10-CM | POA: Diagnosis not present

## 2021-09-14 DIAGNOSIS — I672 Cerebral atherosclerosis: Secondary | ICD-10-CM | POA: Diagnosis not present

## 2021-09-14 DIAGNOSIS — I517 Cardiomegaly: Secondary | ICD-10-CM | POA: Diagnosis not present

## 2021-09-14 DIAGNOSIS — I159 Secondary hypertension, unspecified: Secondary | ICD-10-CM

## 2021-09-14 DIAGNOSIS — M4802 Spinal stenosis, cervical region: Secondary | ICD-10-CM | POA: Diagnosis not present

## 2021-09-14 DIAGNOSIS — I63131 Cerebral infarction due to embolism of right carotid artery: Secondary | ICD-10-CM | POA: Diagnosis present

## 2021-09-14 DIAGNOSIS — M7989 Other specified soft tissue disorders: Secondary | ICD-10-CM | POA: Diagnosis not present

## 2021-09-14 DIAGNOSIS — M532X1 Spinal instabilities, occipito-atlanto-axial region: Secondary | ICD-10-CM | POA: Diagnosis not present

## 2021-09-14 DIAGNOSIS — R414 Neurologic neglect syndrome: Secondary | ICD-10-CM | POA: Diagnosis present

## 2021-09-14 DIAGNOSIS — Z888 Allergy status to other drugs, medicaments and biological substances status: Secondary | ICD-10-CM

## 2021-09-14 DIAGNOSIS — R1310 Dysphagia, unspecified: Secondary | ICD-10-CM | POA: Diagnosis not present

## 2021-09-14 DIAGNOSIS — I214 Non-ST elevation (NSTEMI) myocardial infarction: Secondary | ICD-10-CM | POA: Diagnosis present

## 2021-09-14 DIAGNOSIS — I493 Ventricular premature depolarization: Secondary | ICD-10-CM | POA: Diagnosis present

## 2021-09-14 DIAGNOSIS — E119 Type 2 diabetes mellitus without complications: Secondary | ICD-10-CM | POA: Diagnosis not present

## 2021-09-14 DIAGNOSIS — R451 Restlessness and agitation: Secondary | ICD-10-CM | POA: Diagnosis not present

## 2021-09-14 DIAGNOSIS — D72829 Elevated white blood cell count, unspecified: Secondary | ICD-10-CM | POA: Diagnosis not present

## 2021-09-14 DIAGNOSIS — I6521 Occlusion and stenosis of right carotid artery: Secondary | ICD-10-CM | POA: Diagnosis not present

## 2021-09-14 DIAGNOSIS — I6523 Occlusion and stenosis of bilateral carotid arteries: Secondary | ICD-10-CM | POA: Diagnosis not present

## 2021-09-14 DIAGNOSIS — R000 Tachycardia, unspecified: Secondary | ICD-10-CM | POA: Diagnosis not present

## 2021-09-14 DIAGNOSIS — Z743 Need for continuous supervision: Secondary | ICD-10-CM | POA: Diagnosis not present

## 2021-09-14 DIAGNOSIS — I6340 Cerebral infarction due to embolism of unspecified cerebral artery: Secondary | ICD-10-CM | POA: Diagnosis not present

## 2021-09-14 DIAGNOSIS — E876 Hypokalemia: Secondary | ICD-10-CM

## 2021-09-14 DIAGNOSIS — H538 Other visual disturbances: Secondary | ICD-10-CM | POA: Diagnosis present

## 2021-09-14 DIAGNOSIS — M50223 Other cervical disc displacement at C6-C7 level: Secondary | ICD-10-CM | POA: Diagnosis not present

## 2021-09-14 DIAGNOSIS — Z4682 Encounter for fitting and adjustment of non-vascular catheter: Secondary | ICD-10-CM | POA: Diagnosis not present

## 2021-09-14 DIAGNOSIS — M898X9 Other specified disorders of bone, unspecified site: Secondary | ICD-10-CM | POA: Diagnosis present

## 2021-09-14 DIAGNOSIS — R404 Transient alteration of awareness: Secondary | ICD-10-CM | POA: Diagnosis not present

## 2021-09-14 DIAGNOSIS — Z885 Allergy status to narcotic agent status: Secondary | ICD-10-CM

## 2021-09-14 DIAGNOSIS — R739 Hyperglycemia, unspecified: Secondary | ICD-10-CM | POA: Diagnosis not present

## 2021-09-14 DIAGNOSIS — Z9889 Other specified postprocedural states: Secondary | ICD-10-CM | POA: Diagnosis not present

## 2021-09-14 DIAGNOSIS — R1312 Dysphagia, oropharyngeal phase: Secondary | ICD-10-CM | POA: Diagnosis not present

## 2021-09-14 DIAGNOSIS — L89152 Pressure ulcer of sacral region, stage 2: Secondary | ICD-10-CM | POA: Diagnosis not present

## 2021-09-14 DIAGNOSIS — Z8249 Family history of ischemic heart disease and other diseases of the circulatory system: Secondary | ICD-10-CM

## 2021-09-14 DIAGNOSIS — J9811 Atelectasis: Secondary | ICD-10-CM | POA: Diagnosis not present

## 2021-09-14 DIAGNOSIS — Z833 Family history of diabetes mellitus: Secondary | ICD-10-CM

## 2021-09-14 DIAGNOSIS — I6503 Occlusion and stenosis of bilateral vertebral arteries: Secondary | ICD-10-CM | POA: Diagnosis not present

## 2021-09-14 DIAGNOSIS — E875 Hyperkalemia: Secondary | ICD-10-CM | POA: Diagnosis present

## 2021-09-14 DIAGNOSIS — G459 Transient cerebral ischemic attack, unspecified: Secondary | ICD-10-CM | POA: Diagnosis not present

## 2021-09-14 DIAGNOSIS — M50221 Other cervical disc displacement at C4-C5 level: Secondary | ICD-10-CM | POA: Diagnosis not present

## 2021-09-14 DIAGNOSIS — Z4659 Encounter for fitting and adjustment of other gastrointestinal appliance and device: Secondary | ICD-10-CM

## 2021-09-14 DIAGNOSIS — I5030 Unspecified diastolic (congestive) heart failure: Secondary | ICD-10-CM | POA: Diagnosis not present

## 2021-09-14 HISTORY — PX: RADIOLOGY WITH ANESTHESIA: SHX6223

## 2021-09-14 HISTORY — PX: IR CT HEAD LTD: IMG2386

## 2021-09-14 HISTORY — PX: IR US GUIDE VASC ACCESS LEFT: IMG2389

## 2021-09-14 HISTORY — PX: IR PERCUTANEOUS ART THROMBECTOMY/INFUSION INTRACRANIAL INC DIAG ANGIO: IMG6087

## 2021-09-14 LAB — URINALYSIS, COMPLETE (UACMP) WITH MICROSCOPIC
Bilirubin Urine: NEGATIVE
Glucose, UA: 100 mg/dL — AB
Ketones, ur: NEGATIVE mg/dL
Leukocytes,Ua: NEGATIVE
Nitrite: NEGATIVE
Protein, ur: 100 mg/dL — AB
Specific Gravity, Urine: 1.01 (ref 1.005–1.030)
pH: 6 (ref 5.0–8.0)

## 2021-09-14 LAB — DIFFERENTIAL
Abs Immature Granulocytes: 0.09 10*3/uL — ABNORMAL HIGH (ref 0.00–0.07)
Basophils Absolute: 0.1 10*3/uL (ref 0.0–0.1)
Basophils Relative: 1 %
Eosinophils Absolute: 0 10*3/uL (ref 0.0–0.5)
Eosinophils Relative: 0 %
Immature Granulocytes: 1 %
Lymphocytes Relative: 8 %
Lymphs Abs: 0.9 10*3/uL (ref 0.7–4.0)
Monocytes Absolute: 0.5 10*3/uL (ref 0.1–1.0)
Monocytes Relative: 5 %
Neutro Abs: 8.8 10*3/uL — ABNORMAL HIGH (ref 1.7–7.7)
Neutrophils Relative %: 85 %

## 2021-09-14 LAB — POCT I-STAT 7, (LYTES, BLD GAS, ICA,H+H)
Acid-base deficit: 4 mmol/L — ABNORMAL HIGH (ref 0.0–2.0)
Bicarbonate: 22.6 mmol/L (ref 20.0–28.0)
Calcium, Ion: 1.23 mmol/L (ref 1.15–1.40)
HCT: 29 % — ABNORMAL LOW (ref 36.0–46.0)
Hemoglobin: 9.9 g/dL — ABNORMAL LOW (ref 12.0–15.0)
O2 Saturation: 97 %
Potassium: 6 mmol/L — ABNORMAL HIGH (ref 3.5–5.1)
Sodium: 137 mmol/L (ref 135–145)
TCO2: 24 mmol/L (ref 22–32)
pCO2 arterial: 44.9 mmHg (ref 32.0–48.0)
pH, Arterial: 7.309 — ABNORMAL LOW (ref 7.350–7.450)
pO2, Arterial: 101 mmHg (ref 83.0–108.0)

## 2021-09-14 LAB — RAPID URINE DRUG SCREEN, HOSP PERFORMED
Amphetamines: NOT DETECTED
Barbiturates: NOT DETECTED
Benzodiazepines: NOT DETECTED
Cocaine: NOT DETECTED
Opiates: NOT DETECTED
Tetrahydrocannabinol: NOT DETECTED

## 2021-09-14 LAB — CBC
HCT: 36.6 % (ref 36.0–46.0)
Hemoglobin: 10.8 g/dL — ABNORMAL LOW (ref 12.0–15.0)
MCH: 29.3 pg (ref 26.0–34.0)
MCHC: 29.5 g/dL — ABNORMAL LOW (ref 30.0–36.0)
MCV: 99.2 fL (ref 80.0–100.0)
Platelets: 167 10*3/uL (ref 150–400)
RBC: 3.69 MIL/uL — ABNORMAL LOW (ref 3.87–5.11)
RDW: 14.6 % (ref 11.5–15.5)
WBC: 10.5 10*3/uL (ref 4.0–10.5)
nRBC: 0 % (ref 0.0–0.2)

## 2021-09-14 LAB — GLUCOSE, CAPILLARY
Glucose-Capillary: 203 mg/dL — ABNORMAL HIGH (ref 70–99)
Glucose-Capillary: 157 mg/dL — ABNORMAL HIGH (ref 70–99)
Glucose-Capillary: 170 mg/dL — ABNORMAL HIGH (ref 70–99)

## 2021-09-14 LAB — I-STAT CHEM 8, ED
BUN: 50 mg/dL — ABNORMAL HIGH (ref 8–23)
Calcium, Ion: 1.17 mmol/L (ref 1.15–1.40)
Chloride: 108 mmol/L (ref 98–111)
Creatinine, Ser: 6.8 mg/dL — ABNORMAL HIGH (ref 0.44–1.00)
Glucose, Bld: 242 mg/dL — ABNORMAL HIGH (ref 70–99)
HCT: 37 % (ref 36.0–46.0)
Hemoglobin: 12.6 g/dL (ref 12.0–15.0)
Potassium: 5.6 mmol/L — ABNORMAL HIGH (ref 3.5–5.1)
Sodium: 139 mmol/L (ref 135–145)
TCO2: 21 mmol/L — ABNORMAL LOW (ref 22–32)

## 2021-09-14 LAB — COMPREHENSIVE METABOLIC PANEL
ALT: 15 U/L (ref 0–44)
AST: 21 U/L (ref 15–41)
Albumin: 3.3 g/dL — ABNORMAL LOW (ref 3.5–5.0)
Alkaline Phosphatase: 100 U/L (ref 38–126)
Anion gap: 15 (ref 5–15)
BUN: 48 mg/dL — ABNORMAL HIGH (ref 8–23)
CO2: 18 mmol/L — ABNORMAL LOW (ref 22–32)
Calcium: 9.4 mg/dL (ref 8.9–10.3)
Chloride: 106 mmol/L (ref 98–111)
Creatinine, Ser: 6.62 mg/dL — ABNORMAL HIGH (ref 0.44–1.00)
GFR, Estimated: 6 mL/min — ABNORMAL LOW (ref >60)
Glucose, Bld: 249 mg/dL — ABNORMAL HIGH (ref 70–99)
Potassium: 5.7 mmol/L — ABNORMAL HIGH (ref 3.5–5.1)
Sodium: 139 mmol/L (ref 135–145)
Total Bilirubin: 1.1 mg/dL (ref 0.3–1.2)
Total Protein: 7 g/dL (ref 6.5–8.1)

## 2021-09-14 LAB — PROTIME-INR
INR: 1.1 (ref 0.8–1.2)
Prothrombin Time: 13.9 seconds (ref 11.4–15.2)

## 2021-09-14 LAB — CBG MONITORING, ED: Glucose-Capillary: 244 mg/dL — ABNORMAL HIGH (ref 70–99)

## 2021-09-14 LAB — ETHANOL: Alcohol, Ethyl (B): <10 mg/dL (ref <10)

## 2021-09-14 LAB — HEPATITIS B SURFACE ANTIGEN: Hepatitis B Surface Ag: NONREACTIVE

## 2021-09-14 LAB — APTT: aPTT: 30 seconds (ref 24–36)

## 2021-09-14 LAB — HEPATITIS B SURFACE ANTIBODY,QUALITATIVE: Hep B S Ab: NONREACTIVE

## 2021-09-14 LAB — RESP PANEL BY RT-PCR (FLU A&B, COVID) ARPGX2
Influenza A by PCR: NEGATIVE
Influenza B by PCR: NEGATIVE
SARS Coronavirus 2 by RT PCR: POSITIVE — AB

## 2021-09-14 LAB — MRSA NEXT GEN BY PCR, NASAL: MRSA by PCR Next Gen: NOT DETECTED

## 2021-09-14 SURGERY — IR WITH ANESTHESIA
Anesthesia: General

## 2021-09-14 MED ORDER — SODIUM CHLORIDE 0.9 % IV SOLN: 100.0000 mL | INTRAVENOUS | Status: DC | PRN

## 2021-09-14 MED ORDER — ASPIRIN 325 MG PO TABS: 325.0000 mg | ORAL_TABLET | Freq: Once | ORAL | Status: DC

## 2021-09-14 MED ORDER — SEVELAMER CARBONATE 800 MG PO TABS
1600.0000 mg | ORAL_TABLET | Freq: Three times a day (TID) | ORAL | Status: DC
  Administered 2021-09-14: 16:00:00 1600 mg
  Filled 2021-09-14: qty 2

## 2021-09-14 MED ORDER — FENTANYL CITRATE PF 50 MCG/ML IJ SOSY
25.0000 ug | PREFILLED_SYRINGE | INTRAMUSCULAR | Status: DC | PRN
  Administered 2021-09-15 – 2021-09-17 (×4): 50 ug via INTRAVENOUS
  Filled 2021-09-14: qty 2
  Filled 2021-09-14: qty 1
  Filled 2021-09-14: qty 2

## 2021-09-14 MED ORDER — ETOMIDATE 2 MG/ML IV SOLN
INTRAVENOUS | Status: DC | PRN
  Administered 2021-09-14 (×2): 10 mg via INTRAVENOUS
  Administered 2021-09-14: 6 mg via INTRAVENOUS

## 2021-09-14 MED ORDER — DARBEPOETIN ALFA 40 MCG/0.4ML IJ SOSY
40.0000 ug | PREFILLED_SYRINGE | INTRAMUSCULAR | Status: DC
  Administered 2021-09-15: 16:00:00 40 ug via INTRAVENOUS
  Filled 2021-09-14 (×2): qty 0.4

## 2021-09-14 MED ORDER — LIDOCAINE-PRILOCAINE 2.5-2.5 % EX CREA: 1.0000 "application " | TOPICAL_CREAM | CUTANEOUS | Status: DC | PRN

## 2021-09-14 MED ORDER — PROPOFOL 1000 MG/100ML IV EMUL
0.0000 ug/kg/min | INTRAVENOUS | Status: DC
  Administered 2021-09-14: 12:00:00 50 ug/kg/min via INTRAVENOUS
  Administered 2021-09-14 – 2021-09-16 (×4): 20 ug/kg/min via INTRAVENOUS
  Filled 2021-09-14 (×4): qty 100

## 2021-09-14 MED ORDER — FENTANYL CITRATE PF 50 MCG/ML IJ SOSY
25.0000 ug | PREFILLED_SYRINGE | INTRAMUSCULAR | Status: DC | PRN
  Administered 2021-09-17: 04:00:00 25 ug via INTRAVENOUS
  Filled 2021-09-14: qty 1

## 2021-09-14 MED ORDER — CARVEDILOL 3.125 MG PO TABS: 3.1250 mg | ORAL_TABLET | Freq: Two times a day (BID) | ORAL | Status: DC

## 2021-09-14 MED ORDER — CLEVIDIPINE BUTYRATE 0.5 MG/ML IV EMUL: 0.0000 mg/h | INTRAVENOUS | Status: DC

## 2021-09-14 MED ORDER — SENNOSIDES-DOCUSATE SODIUM 8.6-50 MG PO TABS
1.0000 | ORAL_TABLET | Freq: Every evening | ORAL | Status: DC | PRN
  Filled 2021-09-14: qty 1

## 2021-09-14 MED ORDER — ONDANSETRON HCL 4 MG/2ML IJ SOLN
4.0000 mg | Freq: Four times a day (QID) | INTRAMUSCULAR | Status: DC | PRN
  Administered 2021-09-17 – 2021-09-30 (×2): 4 mg via INTRAVENOUS
  Filled 2021-09-14 (×2): qty 2

## 2021-09-14 MED ORDER — PANTOPRAZOLE SODIUM 40 MG IV SOLR
40.0000 mg | Freq: Every day | INTRAVENOUS | Status: DC
  Administered 2021-09-14: 23:00:00 40 mg via INTRAVENOUS
  Filled 2021-09-14: qty 40

## 2021-09-14 MED ORDER — NOREPINEPHRINE 4 MG/250ML-% IV SOLN
2.0000 ug/min | INTRAVENOUS | Status: DC
  Administered 2021-09-14 – 2021-09-16 (×2): 2 ug/min via INTRAVENOUS
  Administered 2021-09-17 – 2021-09-18 (×2): 4 ug/min via INTRAVENOUS
  Administered 2021-09-19 – 2021-09-20 (×2): 3 ug/min via INTRAVENOUS
  Filled 2021-09-14 (×6): qty 250

## 2021-09-14 MED ORDER — SODIUM CHLORIDE 0.9 % IV SOLN
250.0000 mL | INTRAVENOUS | Status: DC
  Administered 2021-09-20: 22:00:00 250 mL via INTRAVENOUS

## 2021-09-14 MED ORDER — PHENYLEPHRINE HCL-NACL 20-0.9 MG/250ML-% IV SOLN
0.0000 ug/min | INTRAVENOUS | Status: DC
  Administered 2021-09-14: 10:00:00 40 ug/min via INTRAVENOUS

## 2021-09-14 MED ORDER — ATORVASTATIN CALCIUM 80 MG PO TABS
80.0000 mg | ORAL_TABLET | Freq: Every day | ORAL | Status: DC
  Administered 2021-09-14 – 2021-10-02 (×19): 80 mg
  Filled 2021-09-14 (×20): qty 1

## 2021-09-14 MED ORDER — SUCCINYLCHOLINE CHLORIDE 200 MG/10ML IV SOSY
PREFILLED_SYRINGE | INTRAVENOUS | Status: DC | PRN
  Administered 2021-09-14 (×2): 120 mg via INTRAVENOUS

## 2021-09-14 MED ORDER — CHLORHEXIDINE GLUCONATE CLOTH 2 % EX PADS
6.0000 | MEDICATED_PAD | Freq: Every day | CUTANEOUS | Status: DC
  Administered 2021-09-15 – 2021-09-17 (×4): 6 via TOPICAL

## 2021-09-14 MED ORDER — IOHEXOL 350 MG/ML SOLN
100.0000 mL | Freq: Once | INTRAVENOUS | Status: AC | PRN
  Administered 2021-09-14: 09:00:00 100 mL via INTRAVENOUS

## 2021-09-14 MED ORDER — CANGRELOR TETRASODIUM 50 MG IV SOLR
INTRAVENOUS | Status: AC
  Filled 2021-09-14: qty 50

## 2021-09-14 MED ORDER — PROPOFOL 500 MG/50ML IV EMUL
INTRAVENOUS | Status: DC | PRN
  Administered 2021-09-14: 40 ug/kg/min via INTRAVENOUS

## 2021-09-14 MED ORDER — ACETAMINOPHEN 650 MG RE SUPP: 650.0000 mg | RECTAL | Status: DC | PRN

## 2021-09-14 MED ORDER — PENTAFLUOROPROP-TETRAFLUOROETH EX AERO: 1.0000 "application " | INHALATION_SPRAY | CUTANEOUS | Status: DC | PRN

## 2021-09-14 MED ORDER — PHENYLEPHRINE HCL-NACL 20-0.9 MG/250ML-% IV SOLN
INTRAVENOUS | Status: DC | PRN
  Administered 2021-09-14: 20 ug/min via INTRAVENOUS

## 2021-09-14 MED ORDER — SODIUM CHLORIDE 0.9 % IV SOLN: INTRAVENOUS | Status: DC

## 2021-09-14 MED ORDER — HEPARIN SODIUM (PORCINE) 1000 UNIT/ML DIALYSIS: 1000.0000 [IU] | INTRAMUSCULAR | Status: DC | PRN

## 2021-09-14 MED ORDER — ROCURONIUM BROMIDE 10 MG/ML (PF) SYRINGE
PREFILLED_SYRINGE | INTRAVENOUS | Status: DC | PRN
  Administered 2021-09-14: 60 mg via INTRAVENOUS

## 2021-09-14 MED ORDER — QUETIAPINE FUMARATE 25 MG PO TABS
50.0000 mg | ORAL_TABLET | Freq: Two times a day (BID) | ORAL | Status: DC
  Administered 2021-09-14 – 2021-09-16 (×5): 50 mg
  Filled 2021-09-14 (×5): qty 2

## 2021-09-14 MED ORDER — PROPOFOL 10 MG/ML IV BOLUS
INTRAVENOUS | Status: DC | PRN
  Administered 2021-09-14: 20 mg via INTRAVENOUS

## 2021-09-14 MED ORDER — INSULIN ASPART 100 UNIT/ML IJ SOLN
0.0000 [IU] | INTRAMUSCULAR | Status: DC
  Administered 2021-09-14: 21:00:00 3 [IU] via SUBCUTANEOUS
  Administered 2021-09-14: 13:00:00 5 [IU] via SUBCUTANEOUS
  Administered 2021-09-14: 16:00:00 3 [IU] via SUBCUTANEOUS
  Administered 2021-09-16 (×2): 2 [IU] via SUBCUTANEOUS
  Administered 2021-09-16: 20:00:00 3 [IU] via SUBCUTANEOUS
  Administered 2021-09-16: 13:00:00 2 [IU] via SUBCUTANEOUS
  Administered 2021-09-17: 12:00:00 8 [IU] via SUBCUTANEOUS
  Administered 2021-09-17 (×4): 5 [IU] via SUBCUTANEOUS
  Administered 2021-09-17: 09:00:00 8 [IU] via SUBCUTANEOUS
  Administered 2021-09-18: 04:00:00 3 [IU] via SUBCUTANEOUS
  Administered 2021-09-18: 8 [IU] via SUBCUTANEOUS
  Administered 2021-09-18: 20:00:00 11 [IU] via SUBCUTANEOUS
  Administered 2021-09-18: 8 [IU] via SUBCUTANEOUS
  Administered 2021-09-18 (×2): 5 [IU] via SUBCUTANEOUS
  Administered 2021-09-18 – 2021-09-19 (×4): 8 [IU] via SUBCUTANEOUS
  Administered 2021-09-19: 04:00:00 3 [IU] via SUBCUTANEOUS
  Administered 2021-09-19: 12:00:00 8 [IU] via SUBCUTANEOUS
  Administered 2021-09-19: 16:00:00 5 [IU] via SUBCUTANEOUS
  Administered 2021-09-20: 11 [IU] via SUBCUTANEOUS
  Administered 2021-09-20: 8 [IU] via SUBCUTANEOUS
  Administered 2021-09-20: 20:00:00 5 [IU] via SUBCUTANEOUS
  Administered 2021-09-20: 16:00:00 3 [IU] via SUBCUTANEOUS
  Administered 2021-09-20: 11 [IU] via SUBCUTANEOUS
  Administered 2021-09-21 (×2): 3 [IU] via SUBCUTANEOUS
  Administered 2021-09-21: 16:00:00 5 [IU] via SUBCUTANEOUS
  Administered 2021-09-21: 2 [IU] via SUBCUTANEOUS
  Administered 2021-09-21 (×2): 5 [IU] via SUBCUTANEOUS
  Administered 2021-09-21: 05:00:00 8 [IU] via SUBCUTANEOUS
  Administered 2021-09-22: 12:00:00 3 [IU] via SUBCUTANEOUS
  Administered 2021-09-22 (×2): 5 [IU] via SUBCUTANEOUS
  Administered 2021-09-22: 21:00:00 2 [IU] via SUBCUTANEOUS
  Administered 2021-09-22: 09:00:00 5 [IU] via SUBCUTANEOUS
  Administered 2021-09-23 (×2): 2 [IU] via SUBCUTANEOUS
  Administered 2021-09-23: 17:00:00 3 [IU] via SUBCUTANEOUS
  Administered 2021-09-23: 21:00:00 8 [IU] via SUBCUTANEOUS
  Administered 2021-09-24: 5 [IU] via SUBCUTANEOUS
  Administered 2021-09-24: 04:00:00 8 [IU] via SUBCUTANEOUS
  Administered 2021-09-24: 20:00:00 5 [IU] via SUBCUTANEOUS
  Administered 2021-09-24: 3 [IU] via SUBCUTANEOUS
  Administered 2021-09-24: 8 [IU] via SUBCUTANEOUS
  Administered 2021-09-24: 5 [IU] via SUBCUTANEOUS
  Administered 2021-09-25 (×4): 8 [IU] via SUBCUTANEOUS
  Administered 2021-09-26: 21:00:00 3 [IU] via SUBCUTANEOUS
  Administered 2021-09-26 (×4): 2 [IU] via SUBCUTANEOUS
  Administered 2021-09-27: 3 [IU] via SUBCUTANEOUS
  Administered 2021-09-27 (×2): 2 [IU] via SUBCUTANEOUS
  Administered 2021-09-28: 16:00:00 3 [IU] via SUBCUTANEOUS
  Administered 2021-09-28: 5 [IU] via SUBCUTANEOUS
  Administered 2021-09-28: 3 [IU] via SUBCUTANEOUS
  Administered 2021-09-28: 2 [IU] via SUBCUTANEOUS
  Administered 2021-09-28 – 2021-09-29 (×2): 3 [IU] via SUBCUTANEOUS
  Administered 2021-09-29: 8 [IU] via SUBCUTANEOUS
  Administered 2021-09-29: 3 [IU] via SUBCUTANEOUS
  Administered 2021-09-29: 5 [IU] via SUBCUTANEOUS
  Administered 2021-09-29: 2 [IU] via SUBCUTANEOUS
  Administered 2021-09-29: 08:00:00 3 [IU] via SUBCUTANEOUS
  Administered 2021-09-30: 5 [IU] via SUBCUTANEOUS
  Administered 2021-09-30 (×3): 3 [IU] via SUBCUTANEOUS
  Administered 2021-09-30 (×3): 8 [IU] via SUBCUTANEOUS
  Administered 2021-10-01 (×3): 5 [IU] via SUBCUTANEOUS
  Administered 2021-10-01: 3 [IU] via SUBCUTANEOUS
  Administered 2021-10-02: 2 [IU] via SUBCUTANEOUS
  Administered 2021-10-02: 3 [IU] via SUBCUTANEOUS
  Administered 2021-10-02: 2 [IU] via SUBCUTANEOUS
  Administered 2021-10-02 (×2): 5 [IU] via SUBCUTANEOUS
  Administered 2021-10-03: 2 [IU] via SUBCUTANEOUS
  Administered 2021-10-03 (×2): 3 [IU] via SUBCUTANEOUS

## 2021-09-14 MED ORDER — SODIUM CHLORIDE 0.9 % IV SOLN: INTRAVENOUS | Status: DC | PRN

## 2021-09-14 MED ORDER — ACETAMINOPHEN 325 MG PO TABS
650.0000 mg | ORAL_TABLET | ORAL | Status: DC | PRN
  Filled 2021-09-14: qty 2

## 2021-09-14 MED ORDER — CHLORHEXIDINE GLUCONATE 0.12% ORAL RINSE (MEDLINE KIT)
15.0000 mL | Freq: Two times a day (BID) | OROMUCOSAL | Status: DC
  Administered 2021-09-14 – 2021-10-03 (×38): 15 mL via OROMUCOSAL

## 2021-09-14 MED ORDER — ASPIRIN 300 MG RE SUPP: 300.0000 mg | Freq: Once | RECTAL | Status: DC

## 2021-09-14 MED ORDER — STROKE: EARLY STAGES OF RECOVERY BOOK
Freq: Once | Status: AC
  Filled 2021-09-14: qty 1

## 2021-09-14 MED ORDER — ALTEPLASE 2 MG IJ SOLR: 2.0000 mg | Freq: Once | INTRAMUSCULAR | Status: DC | PRN

## 2021-09-14 MED ORDER — SODIUM CHLORIDE 0.9% FLUSH
3.0000 mL | Freq: Once | INTRAVENOUS | Status: AC
Start: 2021-09-14 — End: 2021-09-14
  Administered 2021-09-14: 12:00:00 3 mL via INTRAVENOUS

## 2021-09-14 MED ORDER — LIDOCAINE HCL (PF) 1 % IJ SOLN: 5.0000 mL | INTRAMUSCULAR | Status: DC | PRN

## 2021-09-14 MED ORDER — POLYETHYLENE GLYCOL 3350 17 G PO PACK
17.0000 g | PACK | Freq: Every day | ORAL | Status: DC
  Administered 2021-09-14 – 2021-09-23 (×8): 17 g
  Filled 2021-09-14 (×8): qty 1

## 2021-09-14 MED ORDER — ACETAMINOPHEN 160 MG/5ML PO SOLN
650.0000 mg | ORAL | Status: DC | PRN
  Administered 2021-09-15 – 2021-09-29 (×11): 650 mg
  Filled 2021-09-14 (×11): qty 20.3

## 2021-09-14 MED ORDER — PHENYLEPHRINE 40 MCG/ML (10ML) SYRINGE FOR IV PUSH (FOR BLOOD PRESSURE SUPPORT)
PREFILLED_SYRINGE | INTRAVENOUS | Status: DC | PRN
  Administered 2021-09-14 (×3): 120 ug via INTRAVENOUS

## 2021-09-14 MED ORDER — IOHEXOL 300 MG/ML  SOLN
100.0000 mL | Freq: Once | INTRAMUSCULAR | Status: AC | PRN
  Administered 2021-09-14: 10:00:00 20 mL via INTRA_ARTERIAL

## 2021-09-14 MED ORDER — ORAL CARE MOUTH RINSE
15.0000 mL | OROMUCOSAL | Status: DC
  Administered 2021-09-14 – 2021-10-03 (×178): 15 mL via OROMUCOSAL

## 2021-09-14 NOTE — Code Documentation (Signed)
Stroke Response Nurse Documentation Code Documentation  KESA BIRKY is a 70 y.o. female arriving to [facility] ED via [facility] on 09/14/2021 with past medical hx of CHF, CKD w/ Dialysis, TIA, HTN, DM. Code stroke was activated by EMS.   Patient from home where she was LKW at 0000 when she went to bed. Great Niece found patient unable to speak and left sided weakness. Called EMS and they activated a Code Stroke in the field. Patient has history of BKA on right leg, but performs all ADLs otherwise. She gets around in a wheelchair living with some of her family.   Stroke team at the bedside on patient arrival. Labs drawn and patient cleared for CT by EDP. Patient to CT with team. NIHSS 24, see documentation for details and code stroke times. Patient with disoriented, right gaze preference , left hemianopia, left facial droop, left arm weakness, left leg weakness, left decreased sensation, Expressive aphasia , and dysarthria  on exam. The following imaging was completed: CT, CTA head and neck, CTP. Patient is not a candidate for IV Thrombolytic due to being outside the window.   After CTP, patient was deemed candidate for IR and transferred to IR Suite.   Care/Plan: Admit to ICU.   Bedside handoff with Marylyn Ishihara, RN.    Kathrin Greathouse  Stroke Response RN

## 2021-09-14 NOTE — Procedures (Signed)
INTERVENTIONAL NEURORADIOLOGY BRIEF POSTPROCEDURE NOTE  Diagnostic cerebral angiogram and mechanical thrombectomy  Attending: Dr. Pedro Earls  Assistant: None.  Diagnosis: Right ICA cervical-intracranial occlusion  Access site: LEFT common femoral artery  Access closure: Perclose Prostyle  Anesthesia: GETA  Medication used: Refer to anesthesia documentation.  Complications: None.  Estimated blood loss: 30-50 mL.  Specimen: None.   Findings: Occlusion of the right ICA from the distal neck segment. Mechanical thrombectomy performed with direct contact aspiration with complete recanalization (TICI3). No hemorrhagic or thromboembolic complication.  The patient tolerated the procedure well without incident or complication and is in stable condition.    PLAN: - Bed rest x 6 hours s/p femoral puncture. - Patient will remain intubated. Decision made by anesthesia due to patient being obtunded prior to intervention. - SBP 120- 140 mmHg.

## 2021-09-14 NOTE — Sedation Documentation (Signed)
Patient transported to 4N ICU. Kim RN at the bedside to receive patient along with respiratory therapy. Report given. Left groin site assessed. Clean, dry and intact. No hematoma, soft to palpation. Doppler pulses intact distally.

## 2021-09-14 NOTE — ED Triage Notes (Signed)
Pt arrived to ED via EMS from home as a CODE STROKE. LKW 0000 today. S/S per EMS: nonverbal, L arm and leg weakness, L facial droop. 20g L FA placed by EMS. Pt is dialysis pt w/ fistula and arm restriction to R side. Pt is also a AKA to the right. Pt is baseline A&Ox4, independent in her ADL's and uses a wheelchair. Pt was placed on 4L O2 w/ EMS d/t pt 90% RA. Pt uncooperative in keeping Mitchell in nose. EMS VS: BP 188/116, HR 122, RR 18, 98% 4L, CBG 405.

## 2021-09-14 NOTE — Sedation Documentation (Signed)
Dawn- Bed control notified

## 2021-09-14 NOTE — ED Notes (Signed)
Pt arrival to IR

## 2021-09-14 NOTE — Transfer of Care (Signed)
Immediate Anesthesia Transfer of Care Note  Patient: Jenna Brown  Procedure(s) Performed: IR WITH ANESTHESIA  Patient Location: ICU  Anesthesia Type:General  Level of Consciousness: Patient remains intubated per anesthesia plan  Airway & Oxygen Therapy: Patient remains intubated per anesthesia plan and Patient placed on Ventilator (see vital sign flow sheet for setting)  Post-op Assessment: Report given to RN and Post -op Vital signs reviewed and stable  Post vital signs: Reviewed and stable  Last Vitals:  Vitals Value Taken Time  BP 122/76 09/14/21 1046  Temp    Pulse 115 09/14/21 1050  Resp 18 09/14/21 1050  SpO2 100 % 09/14/21 1050  Vitals shown include unvalidated device data.  Last Pain: There were no vitals filed for this visit.       Complications: No notable events documented.

## 2021-09-14 NOTE — Consult Note (Signed)
Renal Service Consult Note [facility]  Jenna Brown 09/14/2021 Sol Blazing, MD Requesting Physician: Dr. Theda Sers, H.   Reason for Consult: ESRD pt w/ acute CVA HPI: The patient is a 70 y.o. year-old w/ hx of esrd on HD, R AKA, HL, HTN, migraine, CAD/ MI, PAD, hx TIA, DM2 who presented this am w/ code stroke, nonverbal w/ L sided weakness. In ED BP's were 188/116. Pt underwent cerebral angiogram which showed occluded R ICA which was treated w/ mechanical thrombectomy. Pt remains on vent admitted to ICU. Asked to see for renal failure.   Pt seen in ICU, has OG tube in, intubated and sedated. Not available to get any hx.     ROS - n/a   Past Medical History  Past Medical History:  Diagnosis Date   Anemia    Anxiety    takes Xanax prn   Arthritis    Cataract    left eye   CKD (chronic kidney disease), stage IV (HCC)    Coronary artery disease 2016   cath w/ 90% LAD, 95%CFX, 80% OM 2, 60% RCA, not CABG candidate, rx medically   Full dentures    GERD (gastroesophageal reflux disease)    H/O hiatal hernia    Headache(784.0)    when b/p is elevated   Hemorrhoids    Hx of AKA (above knee amputation), right (HCC)    Hyperlipidemia    takes Simvastatin daily   Hypertension    takes Amlodipine/HCTZ/Losartan daily   Migraines    Myocardial infarction ([place]) 1990's   NSTEMI (non-ST elevated myocardial infarction) ([place])    Peripheral vascular disease (HCC)    PONV (postoperative nausea and vomiting)    Stroke ([place])    TIA history   Type II diabetes mellitus ([place])    on Lantus   Vertigo    takes Ativert prn   Past Surgical History  Past Surgical History:  Procedure Laterality Date   ABDOMINAL AORTAGRAM N/A 02/29/2012   Procedure: ABDOMINAL Maxcine Ham;  Surgeon: [name], MD;  Location: [facility] CATH LAB;  Service: Cardiovascular;  Laterality: N/A;   ABDOMINAL AORTOGRAM W/LOWER EXTREMITY N/A 02/11/2020   Procedure: ABDOMINAL AORTOGRAM W/LOWER EXTREMITY;   Surgeon: [name], MD;  Location: [place] CV LAB;  Service: Cardiovascular;  Laterality: N/A;   ABDOMINAL AORTOGRAM W/LOWER EXTREMITY N/A 03/17/2020   Procedure: ABDOMINAL AORTOGRAM W/LOWER EXTREMITY;  Surgeon: [name], MD;  Location: [place] CV LAB;  Service: Cardiovascular;  Laterality: N/A;   AMPUTATION  05/10/2012   Procedure: AMPUTATION ABOVE KNEE;  Surgeon: [name], MD;  [facility] OR;  Service: Vascular;  Laterality: Right;   open right groin wound noted   aortogram  02/2012   AV FISTULA PLACEMENT Right 03/04/2019   Procedure: ARTERIOVENOUS (AV) FISTULA CREATION;  Surgeon: Rosetta Posner, MD;  Location: [place];  Service: Vascular;  Laterality: Right;   CARDIAC CATHETERIZATION N/A 04/13/2015   Procedure: Right/Left Heart Cath and Coronary Angiography;  Surgeon: [name], MD;  Location: [place] CV LAB;  Service: Cardiovascular;  Laterality: N/A;   CLEFT PALATE REPAIR     several   COLONOSCOPY     DILATION AND CURETTAGE OF UTERUS     FEMORAL-POPLITEAL BYPASS GRAFT  04/05/2012   Procedure: BYPASS GRAFT FEMORAL-POPLITEAL ARTERY;  Surgeon: [name], MD;  Location: [place];  Service: Vascular;  Laterality: Right;  REVISION   FEMORAL-TIBIAL BYPASS GRAFT  03/29/2012   Procedure: BYPASS GRAFT  FEMORAL-TIBIAL ARTERY;  Surgeon: [name], MD;  Location: [facility] OR;  Service: Vascular;  Laterality: Right;  Right femoral to Posterior Tibialis with composite graft of 20mm x 80 cm ringed gortex graft and saphenous vein  ,intraoperative arteriogram   FEMOROPOPLITEAL THROMBECTOMY / EMBOLECTOMY  05/09/2012   IR FLUORO GUIDE CV LINE RIGHT  03/01/2019   IR US GUIDE VASC ACCESS RIGHT  03/01/2019   MULTIPLE TOOTH EXTRACTIONS     MYOMECTOMY     PERIPHERAL VASCULAR INTERVENTION Left 03/17/2020   Procedure: PERIPHERAL VASCULAR INTERVENTION;  Surgeon: [name], MD;  Location: [place] CV LAB;  Service: Cardiovascular;  Laterality: Left;  popliteal   PR VEIN  BYPASS GRAFT,AORTO-FEM-POP  05/27/11   Right SFA-Below knee Pop BP   REVISION OF ARTERIOVENOUS GORETEX GRAFT Right 08/21/2019   Procedure: REVISION OF ARTERIOVENOUS FISTULA RIGHT ARM;  Surgeon: Rosetta Posner, MD;  Location: [facility] OR;  Service: Vascular;  Laterality: Right;   TEE WITHOUT CARDIOVERSION N/A 01/03/2017   Procedure: TRANSESOPHAGEAL ECHOCARDIOGRAM (TEE);  Surgeon: Skeet Latch, MD;  Location: [place];  Service: Cardiovascular;  Laterality: N/A;   THORACENTESIS  02/27/2019       TONSILLECTOMY     TUBAL LIGATION  ~ 1990   Family History  Family History  Problem Relation Age of Onset   Cancer Mother        breast   Diabetes Father    Cancer Brother    Heart attack Maternal Grandmother    Heart attack Maternal Grandfather    Social History  reports that she quit smoking about 9 years ago. Her smoking use included cigarettes. She has a 10.00 pack-year smoking history. She has never used smokeless tobacco. She reports that she does not drink alcohol and does not use drugs. Allergies  Allergies  Allergen Reactions   Plavix [Clopidogrel Bisulfate] Itching   Codeine Other (See Comments)    "makes me feel strange"   Hydrocodone Other (See Comments)    Feels disorinated   Losartan Other (See Comments)   Tylenol With Codeine #3 [Acetaminophen-Codeine] Other (See Comments)    "doesn't make me feel right"   Home medications Prior to Admission medications   Medication Sig Start Date End Date Taking? Authorizing Provider  acetaminophen (TYLENOL) 500 MG tablet Take 500 mg by mouth at bedtime.   Yes [provider]  aspirin 81 MG chewable tablet Chew 81 mg by mouth daily.   Yes [provider]  atorvastatin (LIPITOR) 80 MG tablet Take 80 mg by mouth daily.  01/09/18  Yes [provider]  cholecalciferol (VITAMIN D) 25 MCG (1000 UT) tablet Take 1,000 Units by mouth daily.   Yes [provider]  docusate sodium (COLACE) 100 MG capsule Take 100 mg by  mouth daily as needed for mild constipation.   Yes [provider]  hydrALAZINE (APRESOLINE) 10 MG tablet Take 1 tablet by mouth three times daily on non-dialysis days 08/19/21  Yes Vann, Jessica U, DO  Insulin Glargine (BASAGLAR KWIKPEN) 100 UNIT/ML Inject 20 Units into the skin 2 (two) times daily. Taking 22 units at bedtime   Yes [provider]  isosorbide mononitrate (IMDUR) 30 MG 24 hr tablet Take 1/2 tablet (15 mg total) by mouth daily. 08/20/21  Yes Vann, Jessica U, DO  nitroGLYCERIN (NITROSTAT) 0.4 MG SL tablet DISSOLVE 1 TABLET BY MOUTH UNDER THE TONGUE EVERY 5 MINUTES FOR 3 DOSES AS NEEDED FOR CHEST PAIN Patient taking differently: 0.4 mg every 5 (five) minutes  as needed for chest pain. 04/28/21  Yes Skeet Latch, MD  oxyCODONE-acetaminophen (PERCOCET/ROXICET) 5-325 MG tablet Take 1 tablet by mouth every 8 (eight) hours as needed for severe pain. 08/30/21  Yes Davonna Belling, MD  QUEtiapine (SEROQUEL) 50 MG tablet Take 50 mg by mouth 2 (two) times daily. 08/27/21  Yes [provider]  RENVELA 800 MG tablet Take 1,600 mg by mouth 3 (three) times daily with meals.   Yes [provider]  zinc sulfate 220 (50 Zn) MG capsule Take 1 capsule (220 mg total) by mouth daily. 08/20/21  Yes Geradine Girt, DO  carvedilol (COREG) 3.125 MG tablet Take 1 tablet (3.125 mg total) by mouth 2 (two) times daily with a meal. 08/19/21   Geradine Girt, DO  ticagrelor (BRILINTA) 90 MG TABS tablet Take 1 tablet (90 mg total) by mouth 2 (two) times daily. 07/13/20   [name], MD     Vitals:   09/14/21 1230 09/14/21 1330 09/14/21 1400 09/14/21 1430  BP: 128/77 130/89 127/77 135/87  Pulse: 96 94    Resp: 16 13 16 16   Temp:      TempSrc:      SpO2: 100% 100%  100%  Weight:      Height:       Exam Gen on vent, sedated No rash, cyanosis or gangrene Sclera anicteric, throat w/ ETT No jvd or bruits Chest clear anterior/ lateral RRR no RG Abd soft ntnd no  mass or ascites +bs GU normal MS no joint effusions or deformity Ext no LE or UE edema, no wounds or ulcers Neuro is on vent, sedated  RUA AVG +bruit    Home meds include - asa, lipitor, coreg 3.125 bid, hydralazine tid, insulin glargine, imdur, sl ntg, percocet, seroquel, renvela 2 ac tid, brilinta, vits/ supps     OP HD: [facility] MWF    3h  70kg  F200 2/2.5 bath  350/600   Hep 1000 then 500u/hr  - aranesp 35 ug q wk  - venofer 50mg  weekly   - hectorol 2.5 ug IV tiw  - last HD 12/23     Na 139  K 5.7  BUN 48  creat 6.6      CXR 12/27 - IMPRESSION:  New perihilar and left lower lobe airspace opacity suspicious for aspiration or edema.   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 Pt underwent mechanical thrombectomy of R ICA today.  VDRF - on vent , sedated ESRD - on HD MWF. Last HD 12/23.  Plan HD later this evening in ICU.  H/o CM - LVEF 20-25% last echo HTN - uncontrolled on admission Volume - no sig vol overload on exam, 1kg over dry wt by wts here. CXR findings look more like consolidation/ pna/ aspiration perhaps.  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, cont vdra w/ HD mwf    Rob Lashanti Chambless  MD 09/14/2021, 2:57 PM     Recent Labs  Lab 09/14/21 0909 09/14/21 0915 09/14/21 1159  WBC 10.5  --   --   HGB 10.8* 12.6 9.9*   Recent Labs  Lab 09/14/21 0909 09/14/21 0915 09/14/21 1159  K 5.7* 5.6* 6.0*  BUN 48* 50*  --   CREATININE 6.62* 6.80*  --   CALCIUM 9.4  --   --

## 2021-09-14 NOTE — ED Provider Notes (Signed)
[facility] EMERGENCY DEPARTMENT Provider Note   CSN: [id number] Arrival date & time: 09/14/21  0539     History No chief complaint on file.   Jenna Brown is a 70 y.o. female.  HPI Patient arrives as code stroke.  She has multiple complex medical conditions including CKD on dialysis with multiple other medical comorbidities.  Patient has right lower extremity amputation.  She was last seen well by family members last night at midnight.  Morning she was found to have slurred speech and decreased use of left arm and left leg.  At baseline patient is reported to be alert and oriented x4 and independent in ADLs with use of a wheelchair due to right AKA.    Past Medical History:  Diagnosis Date   Anemia    Anxiety    takes Xanax prn   Arthritis    Cataract    left eye   CKD (chronic kidney disease), stage IV (HCC)    Coronary artery disease 2016   cath w/ 90% LAD, 95%CFX, 80% OM 2, 60% RCA, not CABG candidate, rx medically   Full dentures    GERD (gastroesophageal reflux disease)    H/O hiatal hernia    Headache(784.0)    when b/p is elevated   Hemorrhoids    Hx of AKA (above knee amputation), right (HCC)    Hyperlipidemia    takes Simvastatin daily   Hypertension    takes Amlodipine/HCTZ/Losartan daily   Migraines    Myocardial infarction ([place]) 1990's   NSTEMI (non-ST elevated myocardial infarction) ([place])    Peripheral vascular disease ([place])    PONV (postoperative nausea and vomiting)    Stroke ([place])    TIA history   Type II diabetes mellitus ([place])    on Lantus   Vertigo    takes Ativert prn    Patient Active Problem List   Diagnosis Date Noted   NSTEMI (non-ST elevated myocardial infarction) ([place]) 08/16/2021   COVID-19 virus infection 08/16/2021   Anemia due to chronic kidney disease 08/16/2021   Dyspepsia 07/01/2021   Long term (current) use of insulin ([place]) 07/01/2021   Vitamin B12 deficiency 07/01/2021   Vitamin D deficiency 07/01/2021    Above knee amputation of left lower extremity ([place]) 06/29/2021   Hyperglycemia due to type 2 diabetes mellitus ([place]) 06/29/2021   Hyperhomocysteinemia ([place]) 06/29/2021   Recurrent UTI 01/12/2021   Critical lower limb ischemia ([place]) 02/07/2020   Pleural effusion, bilateral 02/26/2019   History of non-ST elevation myocardial infarction (NSTEMI) 09/15/2018   Goals of care, counseling/discussion    Palliative care by specialist    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[facility])    Insulin-requiring or dependent type II diabetes mellitus ([place]) 01/11/2018   CAD in native artery    History of TIA (transient ischemic attack) 05/14/2015   Chronic combined systolic and diastolic heart failure ([place]) 05/14/2015   Hyperkalemia    Essential hypertension    Hyperlipidemia LDL goal <70    Anemia 05/10/2012   Anxiety 05/09/2012   Peripheral vascular disease ([place])     Past Surgical History:  Procedure Laterality Date   ABDOMINAL AORTAGRAM N/A 02/29/2012   Procedure: ABDOMINAL Maxcine Ham;  Surgeon: [name], MD;  Location: [facility] CATH LAB;  Service: Cardiovascular;  Laterality: N/A;   ABDOMINAL AORTOGRAM W/LOWER EXTREMITY N/A 02/11/2020   Procedure: ABDOMINAL AORTOGRAM W/LOWER EXTREMITY;  Surgeon: [name], MD;  Location: [facility] CV LAB;  Service: Cardiovascular;  Laterality: N/A;   ABDOMINAL  AORTOGRAM W/LOWER EXTREMITY N/A 03/17/2020   Procedure: ABDOMINAL AORTOGRAM W/LOWER EXTREMITY;  Surgeon: [name], MD;  Location: [place] CV LAB;  Service: Cardiovascular;  Laterality: N/A;   AMPUTATION  05/10/2012   Procedure: AMPUTATION ABOVE KNEE;  Surgeon: [name], MD;  Location: [facility] OR;  Service: Vascular;  Laterality: Right;   open right groin wound noted   aortogram  02/2012   AV FISTULA PLACEMENT Right 03/04/2019   Procedure: ARTERIOVENOUS (AV) FISTULA CREATION;  Surgeon: Rosetta Posner, MD;  Location: [place];  Service: Vascular;  Laterality: Right;   CARDIAC CATHETERIZATION N/A 04/13/2015    Procedure: Right/Left Heart Cath and Coronary Angiography;  Surgeon: [name], MD;  Location: [place] CV LAB;  Service: Cardiovascular;  Laterality: N/A;   CLEFT PALATE REPAIR     several   COLONOSCOPY     DILATION AND CURETTAGE OF UTERUS     FEMORAL-POPLITEAL BYPASS GRAFT  04/05/2012   Procedure: BYPASS GRAFT FEMORAL-POPLITEAL ARTERY;  Surgeon: [name], MD;  Location: [place];  Service: Vascular;  Laterality: Right;  REVISION   FEMORAL-TIBIAL BYPASS GRAFT  03/29/2012   Procedure: BYPASS GRAFT FEMORAL-TIBIAL ARTERY;  Surgeon: [name], MD;  Location: [facility] OR;  Service: Vascular;  Laterality: Right;  Right femoral to Posterior Tibialis with composite graft of 70mm x 80 cm ringed gortex graft and saphenous vein  ,intraoperative arteriogram   FEMOROPOPLITEAL THROMBECTOMY / EMBOLECTOMY  05/09/2012   IR FLUORO GUIDE CV LINE RIGHT  03/01/2019   IR US GUIDE VASC ACCESS RIGHT  03/01/2019   MULTIPLE TOOTH EXTRACTIONS     MYOMECTOMY     PERIPHERAL VASCULAR INTERVENTION Left 03/17/2020   Procedure: PERIPHERAL VASCULAR INTERVENTION;  Surgeon: [name], MD;  Location: [place] CV LAB;  Service: Cardiovascular;  Laterality: Left;  popliteal   PR VEIN BYPASS GRAFT,AORTO-FEM-POP  05/27/11   Right SFA-Below knee Pop BP   REVISION OF ARTERIOVENOUS GORETEX GRAFT Right 08/21/2019   Procedure: REVISION OF ARTERIOVENOUS FISTULA RIGHT ARM;  Surgeon: Rosetta Posner, MD;  Location: [facility] OR;  Service: Vascular;  Laterality: Right;   TEE WITHOUT CARDIOVERSION N/A 01/03/2017   Procedure: TRANSESOPHAGEAL ECHOCARDIOGRAM (TEE);  Surgeon: Skeet Latch, MD;  Location: [place];  Service: Cardiovascular;  Laterality: N/A;   THORACENTESIS  02/27/2019       TONSILLECTOMY     TUBAL LIGATION  ~ 1990     OB History   No obstetric history on file.     Family History  Problem Relation Age of Onset   Cancer Mother        breast   Diabetes Father    Cancer Brother    Heart attack Maternal  Grandmother    Heart attack Maternal Grandfather     Social History   Tobacco Use   Smoking status: Former    Packs/day: 0.25    Years: 40.00    Pack years: 10.00    Types: Cigarettes    Quit date: 02/27/2012    Years since quitting: 9.5   Smokeless tobacco: Never  Vaping Use   Vaping Use: Never used  Substance Use Topics   Alcohol use: No   Drug use: No    Home Medications Prior to Admission medications   Medication Sig Start Date End Date Taking? Authorizing Provider  acetaminophen (TYLENOL) 500 MG tablet Take 500 mg by mouth at bedtime.    [provider]  ALPRAZolam Duanne Moron) 0.25 MG tablet Take 1 tablet (0.25  mg total) by mouth 3 (three) times daily as needed for anxiety. 01/03/17   Delos Haring, PA-C  aspirin 81 MG chewable tablet Chew 81 mg by mouth daily.    [provider]  atorvastatin (LIPITOR) 80 MG tablet Take 80 mg by mouth daily.  01/09/18   [provider]  carvedilol (COREG) 3.125 MG tablet Take 1 tablet (3.125 mg total) by mouth 2 (two) times daily with a meal. 08/19/21   Geradine Girt, DO  cholecalciferol (VITAMIN D) 25 MCG (1000 UT) tablet Take 1,000 Units by mouth daily.    [provider]  docusate sodium (COLACE) 100 MG capsule Take 100 mg by mouth daily as needed for mild constipation.    [provider]  hydrALAZINE (APRESOLINE) 10 MG tablet Take 1 tablet by mouth three times daily on non-dialysis days 08/19/21   Geradine Girt, DO  Insulin Glargine [employer]) 100 UNIT/ML Inject 22 Units into the skin at bedtime.    [provider]  isosorbide mononitrate (IMDUR) 30 MG 24 hr tablet Take 1/2 tablet (15 mg total) by mouth daily. 08/20/21   Geradine Girt, DO  nitroGLYCERIN (NITROSTAT) 0.4 MG SL tablet DISSOLVE 1 TABLET BY MOUTH UNDER THE TONGUE EVERY 5 MINUTES FOR 3 DOSES AS NEEDED FOR CHEST PAIN Patient taking differently: 0.4 mg every 5 (five) minutes as needed for chest pain. 04/28/21   Skeet Latch, MD  oxyCODONE-acetaminophen (PERCOCET/ROXICET) 5-325 MG tablet Take 1 tablet by mouth every 8 (eight) hours as needed for severe pain. 08/30/21   Davonna Belling, MD  RENVELA 800 MG tablet Take 800 mg by mouth 3 (three) times daily with meals.     [provider]  ticagrelor (BRILINTA) 90 MG TABS tablet Take 1 tablet (90 mg total) by mouth 2 (two) times daily. 07/13/20   [name], MD  zinc sulfate 220 (50 Zn) MG capsule Take 1 capsule (220 mg total) by mouth daily. 08/20/21   Geradine Girt, DO    Allergies    Plavix [clopidogrel bisulfate], Codeine, Losartan, and Tylenol with codeine #3 [acetaminophen-codeine]  Review of Systems   Review of Systems Level 5 caveat cannot obtain review of systems due to patient condition. Physical Exam Updated Vital Signs Wt 71.2 kg    BMI 26.94 kg/m   Physical Exam Constitutional:      Comments: Patient is on EMS stretcher.  She is trying to vocalize but it is predominantly coming out of the moan, she is trying to use her right upper extremity to gesture.  Respiratory distress.  Genererally pale appearance.  HENT:     Head: Normocephalic and atraumatic.     Mouth/Throat:     Comments: Airway is clear.  No pooling secretions or sonorous respirations.  Patient is vocalizing although unintelligible. Cardiovascular:     Comments: Tachycardia, regular. Pulmonary:     Comments: No respiratory distress.  Anterior auscultation for symmetric breath sounds. Musculoskeletal:     Comments: Patient has AKA on the right.  Left lower extremity has 1-2+ edema and scaling on the feet.  Skin:    General: Skin is warm and dry.     Coloration: Skin is pale.  Neurological:     Comments: Patient is awake.  She is trying to talk but sounds are garbled.  She is following some commands for gaze testing.  She is spontaneously picking up her right upper extremity and trying to reach.  Psychiatric:     Comments: Anxious.  ED Results /  Procedures / Treatments   Labs (all labs ordered are listed, but only abnormal results are displayed) Labs Reviewed  CBG MONITORING, ED - Abnormal; Notable for the following components:      Result Value   Glucose-Capillary 244 (*)    All other components within normal limits  PROTIME-INR  APTT  CBC  DIFFERENTIAL  COMPREHENSIVE METABOLIC PANEL  I-STAT CHEM 8, ED  CBG MONITORING, ED    EKG None  Radiology No results found.  Procedures Procedures   Medications Ordered in ED Medications  sodium chloride flush (NS) 0.9 % injection 3 mL (has no administration in time range)    ED Course  I have reviewed the triage vital signs and the nursing notes.  Pertinent labs & imaging results that were available during my care of the patient were reviewed by me and considered in my medical decision making (see chart for details).    MDM Rules/Calculators/A&P                         Patient is assessed to the bridge with first exam.  She is arriving as code stroke with garbled speech and decreased motor function.  She is meeting LVO criteria.  Last normal was at midnight last night.  Patient is cleared to go to CT scan.  Will continue assessment upon return from CT and findings by neurology.  Patient was assessed as outlined.  After CT, patient was taken directly to IR interventional management and did not return to the emergency department for further assessment.   Final Clinical Impression(s) / ED Diagnoses Final diagnoses:  Acute ischemic stroke [facility])        Rx / DC Orders ED Discharge Orders     None        Charlesetta Shanks, MD 09/19/21 [phone]

## 2021-09-14 NOTE — H&P (Addendum)
Neurology H&P  Reason for Consult: Code Stroke with left-sided weakness Referring Physician: Dr. Johnney Killian  CC: Patient is unable to provide a chief complaint due to speech disturbance  History is obtained from: EMS, family (niece and daughter via telephone), and chart review  HPI: Jenna Brown is a 70 y.o. female with a medical history significant for end-stage renal disease on hemodialysis, NSTEMI, coronary artery disease that is medically treated, essential hypertension, hyperlipidemia, right AKA, history of TIA/stroke, and type 2 diabetes mellitus who presented to the ED 12/27 via EMS for evaluation of left-sided weakness, left-facial droop, and with slurred speech / groaning vocalizations. Patient's family reported to EMS that patient was last known well this morning at 00:00 but was found this morning by family with left-sided weakness and left facial droop with subsequent EMS activation.   At baseline, patient lives with her cousin who manages her medications but she is able to complete her ADLs independently. She is usually alert and oriented throughout and uses a wheelchair for transportation with a right AKA.   LKW: 00:00 12/27 TNK given?: no, patient presented to the hospital outside of the thrombolytic therapy window  IR Thrombectomy? Yes, CT angiography imaging reveals an emergent LVO at proximal right ICA and CT perfusion suggestive of core infarct of 29 cc and 168 cc of penumbra affecting the right MCA and PCA territories. Modified Rankin Scale: 3-Moderate disability-requires help but walks WITHOUT assistance  ROS: Unable to obtain due to altered mental status.   Past Medical History:  Diagnosis Date   Anemia    Anxiety    takes Xanax prn   Arthritis    Cataract    left eye   CKD (chronic kidney disease), stage IV (HCC)    Coronary artery disease 2016   cath w/ 90% LAD, 95%CFX, 80% OM 2, 60% RCA, not CABG candidate, rx medically   Full dentures    GERD (gastroesophageal  reflux disease)    H/O hiatal hernia    Headache(784.0)    when b/p is elevated   Hemorrhoids    Hx of AKA (above knee amputation), right (HCC)    Hyperlipidemia    takes Simvastatin daily   Hypertension    takes Amlodipine/HCTZ/Losartan daily   Migraines    Myocardial infarction ([place]) 1990's   NSTEMI (non-ST elevated myocardial infarction) ([place])    Peripheral vascular disease (HCC)    PONV (postoperative nausea and vomiting)    Stroke ([place])    TIA history   Type II diabetes mellitus ([place])    on Lantus   Vertigo    takes Ativert prn   Past Surgical History:  Procedure Laterality Date   ABDOMINAL AORTAGRAM N/A 02/29/2012   Procedure: ABDOMINAL Maxcine Ham;  Surgeon: [name], MD;  Location: [facility] CATH LAB;  Service: Cardiovascular;  Laterality: N/A;   ABDOMINAL AORTOGRAM W/LOWER EXTREMITY N/A 02/11/2020   Procedure: ABDOMINAL AORTOGRAM W/LOWER EXTREMITY;  Surgeon: [name], MD;  Location: [place] CV LAB;  Service: Cardiovascular;  Laterality: N/A;   ABDOMINAL AORTOGRAM W/LOWER EXTREMITY N/A 03/17/2020   Procedure: ABDOMINAL AORTOGRAM W/LOWER EXTREMITY;  Surgeon: [name], MD;  Location: [place] CV LAB;  Service: Cardiovascular;  Laterality: N/A;   AMPUTATION  05/10/2012   Procedure: AMPUTATION ABOVE KNEE;  Surgeon: [name], MD;  Location: [facility] OR;  Service: Vascular;  Laterality: Right;   open right groin wound noted   aortogram  02/2012   AV FISTULA PLACEMENT Right 03/04/2019   Procedure:  ARTERIOVENOUS (AV) FISTULA CREATION;  Surgeon: Rosetta Posner, MD;  Location: [facility] OR;  Service: Vascular;  Laterality: Right;   CARDIAC CATHETERIZATION N/A 04/13/2015   Procedure: Right/Left Heart Cath and Coronary Angiography;  Surgeon: [name], MD;  Location: [place] CV LAB;  Service: Cardiovascular;  Laterality: N/A;   CLEFT PALATE REPAIR     several   COLONOSCOPY     DILATION AND CURETTAGE OF UTERUS     FEMORAL-POPLITEAL BYPASS GRAFT  04/05/2012    Procedure: BYPASS GRAFT FEMORAL-POPLITEAL ARTERY;  Surgeon: [name], MD;  Location: [place];  Service: Vascular;  Laterality: Right;  REVISION   FEMORAL-TIBIAL BYPASS GRAFT  03/29/2012   Procedure: BYPASS GRAFT FEMORAL-TIBIAL ARTERY;  Surgeon: [name], MD;  Location: [facility] OR;  Service: Vascular;  Laterality: Right;  Right femoral to Posterior Tibialis with composite graft of 87mm x 80 cm ringed gortex graft and saphenous vein  ,intraoperative arteriogram   FEMOROPOPLITEAL THROMBECTOMY / EMBOLECTOMY  05/09/2012   IR FLUORO GUIDE CV LINE RIGHT  03/01/2019   IR US GUIDE VASC ACCESS RIGHT  03/01/2019   MULTIPLE TOOTH EXTRACTIONS     MYOMECTOMY     PERIPHERAL VASCULAR INTERVENTION Left 03/17/2020   Procedure: PERIPHERAL VASCULAR INTERVENTION;  Surgeon: [name], MD;  Location: [place] CV LAB;  Service: Cardiovascular;  Laterality: Left;  popliteal   PR VEIN BYPASS GRAFT,AORTO-FEM-POP  05/27/11   Right SFA-Below knee Pop BP   REVISION OF ARTERIOVENOUS GORETEX GRAFT Right 08/21/2019   Procedure: REVISION OF ARTERIOVENOUS FISTULA RIGHT ARM;  Surgeon: Rosetta Posner, MD;  Location: [facility] OR;  Service: Vascular;  Laterality: Right;   TEE WITHOUT CARDIOVERSION N/A 01/03/2017   Procedure: TRANSESOPHAGEAL ECHOCARDIOGRAM (TEE);  Surgeon: Skeet Latch, MD;  Location: [place];  Service: Cardiovascular;  Laterality: N/A;   THORACENTESIS  02/27/2019       TONSILLECTOMY     TUBAL LIGATION  ~ 1990   Family History  Problem Relation Age of Onset   Cancer Mother        breast   Diabetes Father    Cancer Brother    Heart attack Maternal Grandmother    Heart attack Maternal Grandfather    Social History:   reports that she quit smoking about 9 years ago. Her smoking use included cigarettes. She has a 10.00 pack-year smoking history. She has never used smokeless tobacco. She reports that she does not drink alcohol and does not use drugs.  Medications  Current Facility-Administered  Medications:     stroke: mapping our early stages of recovery book, , Does not apply, Once, Toberman, Stevi W, NP   0.9 %  sodium chloride infusion, , Intravenous, Continuous, Toberman, Stevi W, NP   acetaminophen (TYLENOL) tablet 650 mg, 650 mg, Oral, Q4H PRN **OR** acetaminophen (TYLENOL) 160 MG/5ML solution 650 mg, 650 mg, Per Tube, Q4H PRN **OR** acetaminophen (TYLENOL) suppository 650 mg, 650 mg, Rectal, Q4H PRN, Rikki Spearing, NP   aspirin tablet 325 mg, 325 mg, Oral, Once **OR** aspirin suppository 300 mg, 300 mg, Rectal, Once, Antonio Woodhams, Unk Pinto, MD   cangrelor (KENGREAL) 50 MG SOLR, , , ,    senna-docusate (Senokot-S) tablet 1 tablet, 1 tablet, Oral, QHS PRN, Rikki Spearing, NP   sodium chloride flush (NS) 0.9 % injection 3 mL, 3 mL, Intravenous, Once, Pfeiffer, Marcy, MD  Current Outpatient Medications:    acetaminophen (TYLENOL) 500 MG tablet, Take 500 mg by mouth at bedtime., Disp: , Rfl:  ALPRAZolam (XANAX) 0.25 MG tablet, Take 1 tablet (0.25 mg total) by mouth 3 (three) times daily as needed for anxiety., Disp: 12 tablet, Rfl: 0   aspirin 81 MG chewable tablet, Chew 81 mg by mouth daily., Disp: , Rfl:    atorvastatin (LIPITOR) 80 MG tablet, Take 80 mg by mouth daily. , Disp: , Rfl:    carvedilol (COREG) 3.125 MG tablet, Take 1 tablet (3.125 mg total) by mouth 2 (two) times daily with a meal., Disp: 60 tablet, Rfl: 0   cholecalciferol (VITAMIN D) 25 MCG (1000 UT) tablet, Take 1,000 Units by mouth daily., Disp: , Rfl:    docusate sodium (COLACE) 100 MG capsule, Take 100 mg by mouth daily as needed for mild constipation., Disp: , Rfl:    hydrALAZINE (APRESOLINE) 10 MG tablet, Take 1 tablet by mouth three times daily on non-dialysis days, Disp: 90 tablet, Rfl: 0   Insulin Glargine (BASAGLAR KWIKPEN) 100 UNIT/ML, Inject 22 Units into the skin at bedtime., Disp: , Rfl:    isosorbide mononitrate (IMDUR) 30 MG 24 hr tablet, Take 1/2 tablet (15 mg total) by mouth daily., Disp: 30  tablet, Rfl: 0   nitroGLYCERIN (NITROSTAT) 0.4 MG SL tablet, DISSOLVE 1 TABLET BY MOUTH UNDER THE TONGUE EVERY 5 MINUTES FOR 3 DOSES AS NEEDED FOR CHEST PAIN (Patient taking differently: 0.4 mg every 5 (five) minutes as needed for chest pain.), Disp: 25 tablet, Rfl: 5   oxyCODONE-acetaminophen (PERCOCET/ROXICET) 5-325 MG tablet, Take 1 tablet by mouth every 8 (eight) hours as needed for severe pain., Disp: 6 tablet, Rfl: 0   RENVELA 800 MG tablet, Take 800 mg by mouth 3 (three) times daily with meals. , Disp: , Rfl:    ticagrelor (BRILINTA) 90 MG TABS tablet, Take 1 tablet (90 mg total) by mouth 2 (two) times daily., Disp: 60 tablet, Rfl: 3   zinc sulfate 220 (50 Zn) MG capsule, Take 1 capsule (220 mg total) by mouth daily., Disp: , Rfl:   Exam: Current vital signs: Wt 71.2 kg    BMI 26.94 kg/m  Vital signs in last 24 hours: Weight:  [71.2 kg] 71.2 kg (12/27 0900)  GENERAL: Awake, alert, in no acute distress on EMS stretcher Psych: Unable to assess, patient is restless with moaning vocalizations but is cooperative with examination Head: Normocephalic and atraumatic, without obvious abnormality EENT: Normal conjunctivae, dry mucous membranes, no OP obstruction LUNGS: Normal respiratory effort. Desaturates to 78% on room air while in CT with improvement after supplemental oxygen applied. CV: Tachycardia on cardiac monitor. ABDOMEN: Soft, non-tender, non-distended Extremities: warm, well perfused, without obvious deformity  NEURO:  Mental Status: Awake and alert.  She is unable to answer orientation questions for examiner. Patient has minimal usable speech including stating "help" repeatedly in CT but otherwise grunts / moans.  She is unable to provide a clear and coherent history of present illness. She is able to follow simple commands.  Cranial Nerves:  II: PERRL.  III, IV, VI: Patient appears to have a left gaze preference but is able to cross midline to the right when prompted.  V:  Patient does not blink to threat on the left VII: There is a left facial droop present  VIII: Hearing is intact to voice IX, X: Phonation normal.  XI: Head is grossly midline XII: Patient does not protrude tongue to command Motor:   Sensation: Patient does not withdraw or grimace with application of noxious stimuli to the left lower extremity, unable to assess RLE due  to AKA.  Patient grimaces and increases restless movements with stimulation of the right upper extremity.  Coordination: Does not perform Gait: Deferred  NIHSS: 1a Level of Conscious.: 0 1b LOC Questions: 2 1c LOC Commands: 0 2 Best Gaze: 1 3 Visual: 2 4 Facial Palsy: 2 5a Motor Arm - left: 4 5b Motor Arm - Right: 1 6a Motor Leg - Left: 4 6b Motor Leg - Right: 2 7 Limb Ataxia: 0 8 Sensory: 2 9 Best Language: 2 10 Dysarthria: 2 11 Extinct. and Inatten.: 0 TOTAL: 24  Labs I have reviewed labs in epic and the results pertinent to this consultation are: CBC    Component Value Date/Time   WBC 10.5 09/14/2021 0909   RBC 3.69 (L) 09/14/2021 0909   HGB 12.6 09/14/2021 0915   HCT 37.0 09/14/2021 0915   HCT 26.8 (L) 02/26/2019 2212   PLT 167 09/14/2021 0909   MCV 99.2 09/14/2021 0909   MCH 29.3 09/14/2021 0909   MCHC 29.5 (L) 09/14/2021 0909   RDW 14.6 09/14/2021 0909   LYMPHSABS 0.9 09/14/2021 0909   MONOABS 0.5 09/14/2021 0909   EOSABS 0.0 09/14/2021 0909   BASOSABS 0.1 09/14/2021 0909   CMP     Component Value Date/Time   NA 139 09/14/2021 0915   K 5.6 (H) 09/14/2021 0915   CL 108 09/14/2021 0915   CO2 18 (L) 09/14/2021 0909   GLUCOSE 242 (H) 09/14/2021 0915   BUN 50 (H) 09/14/2021 0915   CREATININE 6.80 (H) 09/14/2021 0915   CALCIUM 9.4 09/14/2021 0909   CALCIUM 8.4 (L) 03/06/2019 0705   PROT 7.0 09/14/2021 0909   ALBUMIN 3.3 (L) 09/14/2021 0909   AST 21 09/14/2021 0909   ALT 15 09/14/2021 0909   ALKPHOS 100 09/14/2021 0909   BILITOT 1.1 09/14/2021 0909   GFRNONAA 6 (L) 09/14/2021 0909    GFRAA 10 (L) 03/06/2019 0706   Lipid Panel     Component Value Date/Time   CHOL 136 08/17/2021 0645   TRIG 101 08/17/2021 0645   HDL 48 08/17/2021 0645   CHOLHDL 2.8 08/17/2021 0645   VLDL 20 08/17/2021 0645   LDLCALC 68 08/17/2021 0645   Lab Results  Component Value Date   HGBA1C 5.9 (H) 09/15/2018   Imaging I have reviewed the images obtained:  CT-scan of the brain 12/27: 1. No acute finding, although motion affect sensitivity. 2. Bilateral basal ganglia and left cerebellar infarcts since 2005 comparison. 3. C1-2 subluxation with upper cord impingement, also seen in 2005. Suggest cervical MRI when able  CT angio head and neck with CT cerebral perfusion 12/27: 1. Emergent large vessel occlusion at the proximal right ICA with reconstituted but under filled right cavernous carotid. Circle-of-Willis variant with essentially isolated right M1 segment and a fetal type right PCA. CT perfusion suggests core infarct of 29 cc and 168 cc of penumbra affecting the right MCA and PCA territories. 2. Tenuous flow in the posterior circulation. The left vertebral artery is occluded with minimal retrograde flow in the left V4 reaching the PICA. The right vertebral artery is highly stenotic at the V4 segment. High-grade mid basilar stenosis. 3. Atlantodental instability with cord compression  Assessment: 70 y.o. female with PMHx as above who presented to the ED on 12/27 via EMS as a Code Stroke for evaluation of left-sided weakness, slurred speech, and left facial droop.  - Examination reveals patient with left-sided weakness, unintelligible / slurred speech, left facial droop, and with a left gaze preference and  left visual field cut. Her initial NIHSS was 24. - CT imaging was reviewed without acute findings. CT angio and perfusion imaging were obtained revealing an LVO at the proximal right IVA, fetal type right PCA, and perfusion imaging suggesting a core infarct of 29 cc and 168 cc penumbra.  -  Patient arrived outside of the thrombolytic therapy window with LKW of 00:00 but IR was activated for thrombectomy. Risks and benefits of the procedure were discussed by the attending MD, Dr. Theda Sers and family provided consent for IR.  - Patient's stroke risk factors include her advanced age, ESRD, HLD, HTN, CAD, DM2, and history of TIA/Stroke. She will be admitted for further stroke work up and evaluation.  Plan: Acute Ischemic Stroke Cerebral infarction due to occlusion of the proximal right internal carotid   Acuity: Acute Current Suspected Etiology: Pending further work up Continue Evaluation:  -Admit to: ICU -Prophylaxis per IR following procedure -Blood pressure control, goal per IR -MRI/ECHO/A1C/Lipid panel. -Hyperglycemia management per SSI to maintain glucose 140-180mg /dL. -PT/OT/ST therapies and recommendations when able  CNS Cerebral edema -Close neuro monitoring  Dysarthria -NPO until cleared by speech -ST -Advance diet as tolerated  Hemiplegia and hemiparesis following cerebral infarction affecting left non-dominant side  -PT/OT -PM&R consult  RESP Acute Respiratory Failure  -vent management per ICU -wean when able  CV Essential (primary) hypertension -Aggressive BP control, goal per IR -Titrate oral agents  Hyperlipidemia, unspecified  - Statin for goal LDL < 70  HEME AM CBC -Monitor -Transfuse for hgb < 7  ENDO Type 2 diabetes mellitus with hyperglycemia  -SSI -goal HgbA1c < 7%  GI/GU ESRD -Continue dialysis -Gentle hydration -Avoid nephrotoxic agents -Renal consult  Fluid/Electrolyte Disorders AM CMP -Replete -Repeat labs -Trend -Per dialysis  ID Trend WBC and fever curve -UA pending  Prophylaxis DVT:  SCDs GI: PPI Bowel: Docusate / Senna  Diet: NPO until cleared by speech  Code Status: Full Code    THE FOLLOWING WERE PRESENT ON ADMISSION: CNS -  Acute Ischemic Stroke, Hemiparesis, Hemiplegia Renal -  ESRD on HD Heme-   Anemia of chronic disease  Anibal Henderson, AGAC-NP [facility] Pager: [phone]  The -patient was seen and examined with Anibal Henderson NP. The case was discussed with Neurointervention and the patient was deemed a candidate for thrombectomy albeit hypoperfusion intensity ratio suggests a higher than normal bleeding risk. This was discussed with the family and they consented to proceed with the procedure.  *Patient has atlantodental instability with cord compression and recommend minimizing cervical manipulation especially neck extension. She may benefit from neurosurgical evaluation.   Electronically signed by:  Lynnae Sandhoff, MD Page: [phone] 09/14/2021, 11:07 AM

## 2021-09-14 NOTE — Progress Notes (Signed)
ABG results reported to CCM NP.  Per CCM, fio2 weaned but no other vent changes needed at this time.

## 2021-09-14 NOTE — Consult Note (Signed)
NAME:  Jenna Brown, MRN:  [id number], DOB:  [birth year], LOS: 0 ADMISSION DATE:  09/14/2021, CONSULTATION DATE:  09/14/2021 REFERRING MD: Dr. Debbrah Alar, CHIEF COMPLAINT: Acute stroke s/p thrombectomy  History of Present Illness:  70 year old with ESRD on hemodialysis, coronary artery disease, peripheral vascular disease status post AKA continue with acute right ICA stroke s/p mechanical thrombectomy.  PCCM consulted for help with vent management  She had a recent admission for COVID-19 infection treated with monotherapy or as an outpatient.  She then developed chest pain, possible non-ST elevation MI  Pertinent  Medical History    has a past medical history of Anemia, Anxiety, Arthritis, Cataract, CKD (chronic kidney disease), stage IV ([place]), Coronary artery disease (2016), Full dentures, GERD (gastroesophageal reflux disease), H/O hiatal hernia, Headache(784.0), Hemorrhoids, AKA (above knee amputation), right ([place]), Hyperlipidemia, Hypertension, Migraines, Myocardial infarction ([place]) (1990's), NSTEMI (non-ST elevated myocardial infarction) ([place]), Peripheral vascular disease ([place]), PONV (postoperative nausea and vomiting), Stroke ([place]), Type II diabetes mellitus ([place]), and Vertigo.   Significant Hospital Events: Including procedures, antibiotic start and stop dates in addition to other pertinent events     Interim History / Subjective:    Objective   Blood pressure 94/66, pulse (!) 107, temperature 97.6 F (36.4 C), temperature source Axillary, resp. rate 16, height 5\' 4"  (1.626 m), weight 71.2 kg, SpO2 98 %.    Vent Mode: PRVC FiO2 (%):  [100 %] 100 % Set Rate:  [16 bmp] 16 bmp Vt Set:  [430 mL] 430 mL PEEP:  [5 cmH20] 5 cmH20 Plateau Pressure:  [20 cmH20] 20 cmH20   Intake/Output Summary (Last 24 hours) at 09/14/2021 1145 Last data filed at 09/14/2021 1050 Gross per 24 hour  Intake 250 ml  Output 30 ml  Net 220 ml   Filed Weights   09/14/21 0900 09/14/21 0959  Weight:  71.2 kg 71.2 kg    Examination: Gen:      No acute distress HEENT:  EOMI, sclera anicteric Neck:     No masses; no thyromegaly Lungs:    Clear to auscultation bilaterally; normal respiratory effort CV:         Regular rate and rhythm; no murmurs Abd:      + bowel sounds; soft, non-tender; no palpable masses, no distension Ext:    No edema; adequate peripheral perfusion Skin:      Warm and dry; no rash Neuro: Sedated  Labs/imaging reviewed Significant for potassium 5.6, glucose 242, creatinine 6.80  Resolved Hospital Problem list     Assessment & Plan:  Acute ischemic stroke with occlusion of proximal right internal carotid S/p mechanical thrombectomy Blood pressure control, n.p.o. Management per neurology  End-stage renal disease on hemodialysis Hyperkalemia Nephrology consulted  Severe ischemic cardiomyopathy EF 20-25% on last echocardiogram Holding Coreg, Imdur, Brilinta, hydralazine while she is on pressors  Best Practice (right click and "Reselect all SmartList Selections" daily)   Diet/type: NPO DVT prophylaxis: SCD GI prophylaxis: PPI Lines: N/A Foley:  N/A Code Status:  full code Last date of multidisciplinary goals of care discussion []   Labs   CBC: Recent Labs  Lab 09/14/21 0909 09/14/21 0915  WBC 10.5  --   NEUTROABS 8.8*  --   HGB 10.8* 12.6  HCT 36.6 37.0  MCV 99.2  --   PLT 167  --     Basic Metabolic Panel: Recent Labs  Lab 09/14/21 0909 09/14/21 0915  NA 139 139  K 5.7* 5.6*  CL 106 108  CO2 18*  --  GLUCOSE 249* 242*  BUN 48* 50*  CREATININE 6.62* 6.80*  CALCIUM 9.4  --    GFR: Estimated Creatinine Clearance: 7.7 mL/min (A) (by C-G formula based on SCr of 6.8 mg/dL (H)). Recent Labs  Lab 09/14/21 0909  WBC 10.5    Liver Function Tests: Recent Labs  Lab 09/14/21 0909  AST 21  ALT 15  ALKPHOS 100  BILITOT 1.1  PROT 7.0  ALBUMIN 3.3*   No results for input(s): LIPASE, AMYLASE in the last 168 hours. No results  for input(s): AMMONIA in the last 168 hours.  ABG    Component Value Date/Time   PHART 7.369 04/13/2015 1459   PCO2ART 32.8 (L) 04/13/2015 1459   PO2ART 55.0 (L) 04/13/2015 1459   HCO3 20.5 04/13/2015 1500   TCO2 21 (L) 09/14/2021 0915   ACIDBASEDEF 5.0 (H) 04/13/2015 1500   O2SAT 66.9 05/14/2015 1050     Coagulation Profile: Recent Labs  Lab 09/14/21 0909  INR 1.1    Cardiac Enzymes: No results for input(s): CKTOTAL, CKMB, CKMBINDEX, TROPONINI in the last 168 hours.  HbA1C: Hgb A1c MFr Bld  Date/Time Value Ref Range Status  09/15/2018 05:22 AM 5.9 (H) 4.8 - 5.6 % Final    Comment:    (NOTE) Pre diabetes:          5.7%-6.4% Diabetes:              >6.4% Glycemic control for   <7.0% adults with diabetes   01/11/2018 10:14 AM 6.8 (H) 4.8 - 5.6 % Final    Comment:    (NOTE) Pre diabetes:          5.7%-6.4% Diabetes:              >6.4% Glycemic control for   <7.0% adults with diabetes     CBG: Recent Labs  Lab 09/14/21 0906  GLUCAP 244*    Review of Systems:   Unable to obtain  Past Medical History:  She,  has a past medical history of Anemia, Anxiety, Arthritis, Cataract, CKD (chronic kidney disease), stage IV ([place]), Coronary artery disease (2016), Full dentures, GERD (gastroesophageal reflux disease), H/O hiatal hernia, Headache(784.0), Hemorrhoids, AKA (above knee amputation), right ([place]), Hyperlipidemia, Hypertension, Migraines, Myocardial infarction ([place]) (1990's), NSTEMI (non-ST elevated myocardial infarction) ([place]), Peripheral vascular disease ([place]), PONV (postoperative nausea and vomiting), Stroke ([place]), Type II diabetes mellitus ([place]), and Vertigo.   Surgical History:   Past Surgical History:  Procedure Laterality Date   ABDOMINAL AORTAGRAM N/A 02/29/2012   Procedure: ABDOMINAL AORTAGRAM;  Surgeon: [name], MD;  Location: [facility] CATH LAB;  Service: Cardiovascular;  Laterality: N/A;   ABDOMINAL AORTOGRAM W/LOWER EXTREMITY N/A 02/11/2020    Procedure: ABDOMINAL AORTOGRAM W/LOWER EXTREMITY;  Surgeon: [name], MD;  Location: [place] CV LAB;  Service: Cardiovascular;  Laterality: N/A;   ABDOMINAL AORTOGRAM W/LOWER EXTREMITY N/A 03/17/2020   Procedure: ABDOMINAL AORTOGRAM W/LOWER EXTREMITY;  Surgeon: [name], MD;  Location: [place] CV LAB;  Service: Cardiovascular;  Laterality: N/A;   AMPUTATION  05/10/2012   Procedure: AMPUTATION ABOVE KNEE;  Surgeon: [name], MD;  Location: [facility] OR;  Service: Vascular;  Laterality: Right;   open right groin wound noted   aortogram  02/2012   AV FISTULA PLACEMENT Right 03/04/2019   Procedure: ARTERIOVENOUS (AV) FISTULA CREATION;  Surgeon: Rosetta Posner, MD;  Location: [facility] OR;  Service: Vascular;  Laterality: Right;   CARDIAC CATHETERIZATION N/A 04/13/2015   Procedure: Right/Left Heart Cath and  Coronary Angiography;  Surgeon: [name], MD;  Location: [place] CV LAB;  Service: Cardiovascular;  Laterality: N/A;   CLEFT PALATE REPAIR     several   COLONOSCOPY     DILATION AND CURETTAGE OF UTERUS     FEMORAL-POPLITEAL BYPASS GRAFT  04/05/2012   Procedure: BYPASS GRAFT FEMORAL-POPLITEAL ARTERY;  Surgeon: [name], MD;  Location: [facility];  Service: Vascular;  Laterality: Right;  REVISION   FEMORAL-TIBIAL BYPASS GRAFT  03/29/2012   Procedure: BYPASS GRAFT FEMORAL-TIBIAL ARTERY;  Surgeon: [name], MD;  Location: [facility] OR;  Service: Vascular;  Laterality: Right;  Right femoral to Posterior Tibialis with composite graft of 89mm x 80 cm ringed gortex graft and saphenous vein  ,intraoperative arteriogram   FEMOROPOPLITEAL THROMBECTOMY / EMBOLECTOMY  05/09/2012   IR FLUORO GUIDE CV LINE RIGHT  03/01/2019   IR US GUIDE VASC ACCESS RIGHT  03/01/2019   MULTIPLE TOOTH EXTRACTIONS     MYOMECTOMY     PERIPHERAL VASCULAR INTERVENTION Left 03/17/2020   Procedure: PERIPHERAL VASCULAR INTERVENTION;  Surgeon: [name], MD;  Location: [place] CV LAB;  Service:  Cardiovascular;  Laterality: Left;  popliteal   PR VEIN BYPASS GRAFT,AORTO-FEM-POP  05/27/11   Right SFA-Below knee Pop BP   REVISION OF ARTERIOVENOUS GORETEX GRAFT Right 08/21/2019   Procedure: REVISION OF ARTERIOVENOUS FISTULA RIGHT ARM;  Surgeon: Rosetta Posner, MD;  Location: [facility] OR;  Service: Vascular;  Laterality: Right;   TEE WITHOUT CARDIOVERSION N/A 01/03/2017   Procedure: TRANSESOPHAGEAL ECHOCARDIOGRAM (TEE);  Surgeon: Skeet Latch, MD;  [facility] ENDOSCOPY;  Service: Cardiovascular;  Laterality: N/A;   THORACENTESIS  02/27/2019       TONSILLECTOMY     TUBAL LIGATION  ~ 1990     Social History:   reports that she quit smoking about 9 years ago. Her smoking use included cigarettes. She has a 10.00 pack-year smoking history. She has never used smokeless tobacco. She reports that she does not drink alcohol and does not use drugs.   Family History:  Her family history includes Cancer in her brother and mother; Diabetes in her father; Heart attack in her maternal grandfather and maternal grandmother.   Allergies Allergies  Allergen Reactions   Plavix [Clopidogrel Bisulfate] Itching   Codeine Other (See Comments)    "makes me feel strange"   Hydrocodone Other (See Comments)    Feels disorinated   Losartan Other (See Comments)   Tylenol With Codeine #3 [Acetaminophen-Codeine] Other (See Comments)    "doesn't make me feel right"     Home Medications  Prior to Admission medications   Medication Sig Start Date End Date Taking? Authorizing Provider  acetaminophen (TYLENOL) 500 MG tablet Take 500 mg by mouth at bedtime.   Yes [provider]  aspirin 81 MG chewable tablet Chew 81 mg by mouth daily.   Yes [provider]  atorvastatin (LIPITOR) 80 MG tablet Take 80 mg by mouth daily.  01/09/18  Yes [provider]  cholecalciferol (VITAMIN D) 25 MCG (1000 UT) tablet Take 1,000 Units by mouth daily.   Yes [provider]  docusate sodium (COLACE)  100 MG capsule Take 100 mg by mouth daily as needed for mild constipation.   Yes [provider]  hydrALAZINE (APRESOLINE) 10 MG tablet Take 1 tablet by mouth three times daily on non-dialysis days 08/19/21  Yes Vann, Jessica U, DO  Insulin Glargine (BASAGLAR KWIKPEN) 100 UNIT/ML Inject 20 Units into the skin 2 (  two) times daily. Taking 22 units at bedtime   Yes [provider]  isosorbide mononitrate (IMDUR) 30 MG 24 hr tablet Take 1/2 tablet (15 mg total) by mouth daily. 08/20/21  Yes Vann, Jessica U, DO  nitroGLYCERIN (NITROSTAT) 0.4 MG SL tablet DISSOLVE 1 TABLET BY MOUTH UNDER THE TONGUE EVERY 5 MINUTES FOR 3 DOSES AS NEEDED FOR CHEST PAIN Patient taking differently: 0.4 mg every 5 (five) minutes as needed for chest pain. 04/28/21  Yes Skeet Latch, MD  oxyCODONE-acetaminophen (PERCOCET/ROXICET) 5-325 MG tablet Take 1 tablet by mouth every 8 (eight) hours as needed for severe pain. 08/30/21  Yes Davonna Belling, MD  QUEtiapine (SEROQUEL) 50 MG tablet Take 50 mg by mouth 2 (two) times daily. 08/27/21  Yes [provider]  RENVELA 800 MG tablet Take 1,600 mg by mouth 3 (three) times daily with meals.   Yes [provider]  zinc sulfate 220 (50 Zn) MG capsule Take 1 capsule (220 mg total) by mouth daily. 08/20/21  Yes Geradine Girt, DO  carvedilol (COREG) 3.125 MG tablet Take 1 tablet (3.125 mg total) by mouth 2 (two) times daily with a meal. 08/19/21   Geradine Girt, DO  ticagrelor (BRILINTA) 90 MG TABS tablet Take 1 tablet (90 mg total) by mouth 2 (two) times daily. 07/13/20   [name], MD     Critical care time:    The patient is critically ill with multiple organ system failure and requires high complexity decision making for assessment and support, frequent evaluation and titration of therapies, advanced monitoring, review of radiographic studies and interpretation of complex data.   Critical Care Time devoted to patient care services,  exclusive of separately billable procedures, described in this note is 45 minutes.   Marshell Garfinkel MD [facility] See Amion for pager  If no response to pager , please call [phone] until 7pm After 7:00 pm call Elink  [phone] 09/14/2021, 12:39 PM

## 2021-09-14 NOTE — Progress Notes (Signed)
°  Transition of Care [facility]) Screening Note   Patient Details  Name: Jenna Brown Date of Birth: Feb 27, 1951   Transition of Care [facility]) CM/SW Contact:    Geralynn Ochs, LCSW Phone Number: 09/14/2021, 2:26 PM    Transition of Care Department [facility]) has reviewed patient and no TOC needs have been identified at this time; medical workup ongoing. We will continue to monitor patient advancement through interdisciplinary progression rounds. If new patient transition needs arise, please place a TOC consult.

## 2021-09-14 NOTE — Progress Notes (Addendum)
IVT consult to place PIV via u/s; no suitable veins found; assessed LA, RA is restricted.

## 2021-09-14 NOTE — Sedation Documentation (Signed)
Perclose device left groin

## 2021-09-14 NOTE — Anesthesia Procedure Notes (Signed)
Procedure Name: Intubation Date/Time: 09/14/2021 9:52 AM Performed by: Janace Litten, CRNA Pre-anesthesia Checklist: Patient identified, Emergency Drugs available, Suction available and Patient being monitored Patient Re-evaluated:Patient Re-evaluated prior to induction Oxygen Delivery Method: Circle system utilized Preoxygenation: Pre-oxygenation with 100% oxygen Induction Type: IV induction and Rapid sequence Laryngoscope Size: Glidescope and 3 Grade View: Grade I Tube type: Oral Tube size: 7.0 mm Number of attempts: 1 Placement Confirmation: ETT inserted through vocal cords under direct vision, breath sounds checked- equal and bilateral and positive ETCO2 Secured at: 22 cm Tube secured with: Tape Dental Injury: Teeth and Oropharynx as per pre-operative assessment

## 2021-09-14 NOTE — Progress Notes (Signed)
PT Cancellation Note  Patient Details Name: Jenna Brown MRN: [id number] DOB: November 30, 1950   Cancelled Treatment:    Reason Eval/Treat Not Completed: Active bedrest order (x6 hours s/p femoral puncture, per Radiology note). PT will hold and await post-op bed rest time frame prior to initiating evaluation if appropriate.    Clearnce Sorrel Tanith Dagostino 09/14/2021, 10:45 AM

## 2021-09-15 ENCOUNTER — Inpatient Hospital Stay (HOSPITAL_COMMUNITY): Payer: Medicare Other

## 2021-09-15 ENCOUNTER — Other Ambulatory Visit (HOSPITAL_COMMUNITY): Payer: Self-pay

## 2021-09-15 ENCOUNTER — Encounter (HOSPITAL_COMMUNITY): Payer: Self-pay | Admitting: Radiology

## 2021-09-15 DIAGNOSIS — I5030 Unspecified diastolic (congestive) heart failure: Secondary | ICD-10-CM | POA: Diagnosis not present

## 2021-09-15 DIAGNOSIS — I639 Cerebral infarction, unspecified: Secondary | ICD-10-CM | POA: Diagnosis not present

## 2021-09-15 DIAGNOSIS — I63231 Cerebral infarction due to unspecified occlusion or stenosis of right carotid arteries: Secondary | ICD-10-CM

## 2021-09-15 DIAGNOSIS — I6521 Occlusion and stenosis of right carotid artery: Secondary | ICD-10-CM

## 2021-09-15 DIAGNOSIS — I63431 Cerebral infarction due to embolism of right posterior cerebral artery: Secondary | ICD-10-CM

## 2021-09-15 LAB — CBC
HCT: 28.1 % — ABNORMAL LOW (ref 36.0–46.0)
Hemoglobin: 8.7 g/dL — ABNORMAL LOW (ref 12.0–15.0)
MCH: 30.2 pg (ref 26.0–34.0)
MCHC: 31 g/dL (ref 30.0–36.0)
MCV: 97.6 fL (ref 80.0–100.0)
Platelets: 145 10*3/uL — ABNORMAL LOW (ref 150–400)
RBC: 2.88 MIL/uL — ABNORMAL LOW (ref 3.87–5.11)
RDW: 14.4 % (ref 11.5–15.5)
WBC: 9.2 10*3/uL (ref 4.0–10.5)
nRBC: 0 % (ref 0.0–0.2)

## 2021-09-15 LAB — ECHOCARDIOGRAM COMPLETE
AR max vel: 1.87 cm2
AV Peak grad: 4 mmHg
Ao pk vel: 1 m/s
Area-P 1/2: 6.51 cm2
Calc EF: 27.3 %
Height: 64 in
S' Lateral: 4.5 cm
Single Plane A2C EF: 25.3 %
Single Plane A4C EF: 26.6 %
Weight: 2617.3 oz

## 2021-09-15 LAB — GLUCOSE, CAPILLARY
Glucose-Capillary: 101 mg/dL — ABNORMAL HIGH (ref 70–99)
Glucose-Capillary: 108 mg/dL — ABNORMAL HIGH (ref 70–99)
Glucose-Capillary: 109 mg/dL — ABNORMAL HIGH (ref 70–99)
Glucose-Capillary: 111 mg/dL — ABNORMAL HIGH (ref 70–99)
Glucose-Capillary: 113 mg/dL — ABNORMAL HIGH (ref 70–99)
Glucose-Capillary: 114 mg/dL — ABNORMAL HIGH (ref 70–99)

## 2021-09-15 LAB — COMPREHENSIVE METABOLIC PANEL
ALT: 19 U/L (ref 0–44)
AST: 38 U/L (ref 15–41)
Albumin: 2.5 g/dL — ABNORMAL LOW (ref 3.5–5.0)
Alkaline Phosphatase: 81 U/L (ref 38–126)
Anion gap: 9 (ref 5–15)
BUN: 19 mg/dL (ref 8–23)
CO2: 26 mmol/L (ref 22–32)
Calcium: 8.1 mg/dL — ABNORMAL LOW (ref 8.9–10.3)
Chloride: 102 mmol/L (ref 98–111)
Creatinine, Ser: 2.73 mg/dL — ABNORMAL HIGH (ref 0.44–1.00)
GFR, Estimated: 18 mL/min — ABNORMAL LOW (ref >60)
Glucose, Bld: 105 mg/dL — ABNORMAL HIGH (ref 70–99)
Potassium: 3 mmol/L — ABNORMAL LOW (ref 3.5–5.1)
Sodium: 137 mmol/L (ref 135–145)
Total Bilirubin: 0.7 mg/dL (ref 0.3–1.2)
Total Protein: 5.5 g/dL — ABNORMAL LOW (ref 6.5–8.1)

## 2021-09-15 LAB — LIPID PANEL
Cholesterol: 150 mg/dL (ref 0–200)
HDL: 53 mg/dL (ref >40)
LDL Cholesterol: 74 mg/dL (ref 0–99)
Total CHOL/HDL Ratio: 2.8 RATIO
Triglycerides: 114 mg/dL (ref <150)
VLDL: 23 mg/dL (ref 0–40)

## 2021-09-15 LAB — PHOSPHORUS: Phosphorus: 1.5 mg/dL — ABNORMAL LOW (ref 2.5–4.6)

## 2021-09-15 LAB — HEMOGLOBIN A1C
Hgb A1c MFr Bld: 5.5 % (ref 4.8–5.6)
Mean Plasma Glucose: 111.15 mg/dL

## 2021-09-15 MED ORDER — POTASSIUM CHLORIDE 20 MEQ PO PACK
20.0000 meq | PACK | Freq: Once | ORAL | Status: AC
  Administered 2021-09-15: 09:00:00 20 meq
  Filled 2021-09-15: qty 1

## 2021-09-15 MED ORDER — METOPROLOL TARTRATE 25 MG PO TABS
25.0000 mg | ORAL_TABLET | Freq: Two times a day (BID) | ORAL | Status: DC
  Administered 2021-09-15 – 2021-09-16 (×2): 25 mg
  Filled 2021-09-15 (×2): qty 1

## 2021-09-15 MED ORDER — ASPIRIN 325 MG PO TABS
325.0000 mg | ORAL_TABLET | Freq: Every day | ORAL | Status: DC
  Administered 2021-09-15 – 2021-09-29 (×15): 325 mg
  Filled 2021-09-15 (×15): qty 1

## 2021-09-15 MED ORDER — EZETIMIBE 10 MG PO TABS
10.0000 mg | ORAL_TABLET | Freq: Every day | ORAL | Status: DC
  Filled 2021-09-15: qty 1

## 2021-09-15 MED ORDER — EZETIMIBE 10 MG PO TABS
10.0000 mg | ORAL_TABLET | Freq: Every day | ORAL | Status: DC
  Administered 2021-09-15 – 2021-10-02 (×18): 10 mg
  Filled 2021-09-15 (×18): qty 1

## 2021-09-15 MED ORDER — ASPIRIN 300 MG RE SUPP: 300.0000 mg | Freq: Every day | RECTAL | Status: DC

## 2021-09-15 MED ORDER — PERFLUTREN LIPID MICROSPHERE
1.0000 mL | INTRAVENOUS | Status: AC | PRN
Start: 2021-09-15 — End: 2021-09-15
  Administered 2021-09-15: 15:00:00 2 mL via INTRAVENOUS
  Filled 2021-09-15: qty 10

## 2021-09-15 MED ORDER — PANTOPRAZOLE 2 MG/ML SUSPENSION
40.0000 mg | Freq: Every day | ORAL | Status: DC
Start: 2021-09-15 — End: 2021-10-03
  Administered 2021-09-15 – 2021-10-02 (×18): 40 mg
  Filled 2021-09-15 (×18): qty 20

## 2021-09-15 NOTE — Progress Notes (Addendum)
STROKE TEAM PROGRESS NOTE   ATTENDING NOTE: I reviewed above note and agree with the assessment and plan. Pt was seen and examined.   70 year old female with history of hypertension, hyperlipidemia, diabetes, stroke, CAD, non-STEMI, ESRD on HD, right AKA, CHF admitted for left-sided weakness, left facial droop and slurred speech.  CT head showed no acute abnormality but old right CR/BG and the left cerebellum and left caudate head infarcts.  CTA head and neck showed right ICA occlusion with right MCA decreased flow, read basilar artery high-grade stenosis right V4 high-grade stenosis, left V4 occlusion and bilateral fetal PCAs.  CT perfusion 29/197.  Status post IR with TICI3 at right ICA.  MRI showed right MCA and PCA large infarct with confluent petechial hemorrhage.  EF 20 to 25%, with diffuse akinesis but no LV thrombus.  LDL 74, A1c 5.5, UDS negative.  Hemoglobin 9.9.  Initial CT showed C1-C2 subluxation with anterior spinal cord compression, similar as prior.  MRI C-spine showed diffuse cervical spine spinal canal stenosis with cord flattening but no cord signal abnormality.  On exam patient intubated, however open eyes on voice, right gaze preference, not able to cross midline.  Blinking to visual threat on the right but not on the left.  Corneal and gag reflex present.  Right arm and leg spontaneous movement against gravity, right AKA, follow simple commands on the right.  However left-sided arm and leg flaccid.  Etiology for patient stroke likely due to cardiomyopathy with low EF, although patient does have multiple stroke risk factors.  Given large size of stroke and confluent petechial hemorrhage, no anticoagulant at this time, will continue aspirin for now.  Also continue statin.  Extubation as able per CCM.  She does have tachycardia, will consider metoprolol if p.o. access.  For detailed assessment and plan, please refer to above as I have made changes wherever appropriate.   Jenna Hawking,  MD PhD Stroke Neurology 09/15/2021 9:44 PM  This patient is critically ill due to large right MCA and PCA stroke, right ICA occlusion status post IR, cardiomyopathy with low EF, 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and at significant risk of neurological worsening, death form recurrent stroke, hemorrhagic conversion, heart failure, cardiac arrest, pulmonary edema. This patient's care requires constant monitoring of vital signs, hemodynamics, respiratory and cardiac monitoring, review of multiple databases, neurological assessment, discussion with family, other specialists and medical decision making of high complexity. I spent 45 minutes of neurocritical care time in the care of this patient.  I discussed with CCM Dr. Vaughan Browner    INTERVAL HISTORY Patient is seen in her room with no family at the bedside.  Yesterday, she presented with left sided weakness, left facial droop and slurred speech.  She was brought to the ED.  TNK was not given as she presented outside the window.  CT angiography revealed LVO in the proximal right ICA with perfusion study showing core infarct of 29cc and penumbra of 168cc in right MCA and PCA territories. Mechanical thrombectomy was performed with TICI 3 flow achieved.  She has remained hemodynamically stable after her procedure and remains intubated at this time,  Vitals:   09/15/21 1530 09/15/21 1532 09/15/21 1600 09/15/21 1700  BP: 117/64 117/64 134/75 129/71  Pulse:  (!) 114    Resp: 19 20 19  (!) 23  Temp:   98.9 F (37.2 C)   TempSrc:   Axillary   SpO2: 100% 100%    Weight:      Height:  CBC:  Recent Labs  Lab 09/14/21 0909 09/14/21 0915 09/14/21 1159 09/15/21 0429  WBC 10.5  --   --  9.2  NEUTROABS 8.8*  --   --   --   HGB 10.8*   < > 9.9* 8.7*  HCT 36.6   < > 29.0* 28.1*  MCV 99.2  --   --  97.6  PLT 167  --   --  145*   < > = values in this interval not displayed.   Basic Metabolic Panel:  Recent Labs  Lab 09/14/21 0909 09/14/21 0915 09/14/21 1159  09/15/21 0429  NA 139 139 137 137  K 5.7* 5.6* 6.0* 3.0*  CL 106 108  --  102  CO2 18*  --   --  26  GLUCOSE 249* 242*  --  105*  BUN 48* 50*  --  19  CREATININE 6.62* 6.80*  --  2.73*  CALCIUM 9.4  --   --  8.1*  PHOS  --   --   --  1.5*   Lipid Panel:  Recent Labs  Lab 09/15/21 0429  CHOL 150  TRIG 114  HDL 53  CHOLHDL 2.8  VLDL 23  LDLCALC 74   HgbA1c:  Recent Labs  Lab 09/15/21 0429  HGBA1C 5.5   Urine Drug Screen:  Recent Labs  Lab 09/14/21 1640  LABOPIA NONE DETECTED  COCAINSCRNUR NONE DETECTED  LABBENZ NONE DETECTED  AMPHETMU NONE DETECTED  THCU NONE DETECTED  LABBARB NONE DETECTED    Alcohol Level  Recent Labs  Lab 09/14/21 1103  ETH <10    IMAGING past 24 hours CT HEAD WO CONTRAST  Result Date: 09/15/2021 CLINICAL DATA:  Stroke follow-up EXAM: CT HEAD WITHOUT CONTRAST TECHNIQUE: Contiguous axial images were obtained from the base of the skull through the vertex without intravenous contrast. COMPARISON:  None. FINDINGS: Brain: Mild hyperattenuation within the posterior right MCA territory and right PCA territory. No midline shift or other mass effect. There is periventricular hypoattenuation compatible with chronic microvascular disease. Old left cerebellar infarct. Small amount of subarachnoid blood over the posterior right hemisphere Vascular: Atherosclerotic calcification of the internal carotid arteries at the skull base. No abnormal hyperdensity of the major intracranial arteries or dural venous sinuses. Skull: The visualized skull base, calvarium and extracranial soft tissues are normal. Sinuses/Orbits: No fluid levels or advanced mucosal thickening of the visualized paranasal sinuses. No mastoid or middle ear effusion. The orbits are normal. IMPRESSION: 1. Mild hyperattenuation within the posterior right MCA territory and right PCA territory, likely contrast material. 2. Small amount of subarachnoid hyperdensity over the posterior right hemisphere is  favored to be extravasated contrast rather than subarachnoid blood. 3. Old left cerebellar infarct. Electronically Signed   By: Ulyses Jarred M.D.   On: 09/15/2021 00:26   MR ANGIO HEAD WO CONTRAST  Result Date: 09/15/2021 CLINICAL DATA:  Stroke, follow-up EXAM: MRI HEAD WITHOUT CONTRAST MRA HEAD WITHOUT CONTRAST TECHNIQUE: Multiplanar, multi-echo pulse sequences of the brain and surrounding structures were acquired without intravenous contrast. Angiographic images of the Circle of Willis were acquired using MRA technique without intravenous contrast. COMPARISON:  No prior MRI, correlation is made with CT head 09/15/2021 and CT head and CTA head and neck 09/14/2021. FINDINGS: MRI HEAD FINDINGS Brain: Increased signal on diffusion-weighted imaging with ADC correlate in the right MCA and PCA territory, particularly in the right temporal lobe, anterior to posterior, right hippocampus and medial temporal lobe, right occipital lobe, inferior right parietal lobe, right  thalamus, and right basal ganglia, consistent with the known right MCA and PCA territory infarct; these areas also demonstrate increased T2 hyperintense signal and gyral swelling, consistent with edema. Increased signal on susceptibility weighted imaging is seen most prominently in the right temporal lobe (series 9, image 36), as well as smaller areas in the right occipital lobe (series 9, image 48), right parietal lobe (series 9, image 51), and right basal ganglia (series 9, image 49). Additional punctate foci of restricted diffusion with ADC correlates in the left frontal (series 3, image 39), left parietal (series 3, image 38), left occipital (series 3, images 19 and 20), right frontal (series 3, image 33 and 34), and more superior right parietal lobe (series 3, image 32). Mild local mass effect in the right temporal lobe without significant midline shift. T2 hyperintense signal in the periventricular white matter, likely the sequela of chronic  small vessel ischemic disease. Remote left cerebellar and bilateral basal ganglia infarcts. Vascular: Please see MRA findings below. Skull and upper cervical spine: Normal marrow signal. Redemonstrated advanced C1-C2 degenerative changes, which cause severe spinal canal stenosis. Sinuses/Orbits: Mucosal thickening in the right-greater-than-left maxillary sinus and left sphenoid sinus. Air-fluid level in the right sphenoid sinus. Mucosal thickening in the ethmoid air cells. The orbits are unremarkable. Other: Fluid in the left-greater-than-right mastoid air cells. MRA HEAD FINDINGS Anterior circulation: Both internal carotid arteries are patent to the termini, with patency of the right ICA new compared to the prior CTA. Moderate focal stenosis in the proximal right cavernous carotid, secondary to calcified plaques. Left A1 patent. Aplastic right A1. Normal anterior communicating artery. Moderate to severe narrowing in the left A2 (series 4, image 119). Anterior cerebral arteries are otherwise patent to their distal aspects. Mild left M1 stenosis. No right M1 stenosis or occlusion. Normal MCA bifurcations. Distal MCA branches perfused and symmetric. Posterior circulation: Proximal left V4 is not visualized, with reconstitution after the takeoff of the left PICA. The right V4 is patent. Basilar patent to its distal aspect, although there is mild-to-moderate narrowing after the takeoff of the AICAs. Patent right superior cerebellar artery, originating from the basilar. The left superior cerebellar artery is significantly narrowed at its origin and is poorly visualized, and is likely partially supplied by the left posterior communicating artery and left P1. The left proximal P1 is hypoplastic, with a suspected common origin with the left SCA. The majority of flow is supplied by the left posterior communicating artery. Mild narrowing in the proximal left P2. Left PCA is otherwise patent. The right PCA originates from the  basilar artery and a somewhat diminutive right posterior communicating artery. The distal right PCA segments are patent. Anatomic variants: None significant IMPRESSION: MRI FINDINGS: 1. Acute infarcts in the right MCA and PCA territory, most prominent in the right temporal lobe and right occipital lobe, with involvement of the right inferior parietal lobe, right thalamus and right basal ganglia. These areas also demonstrate gyral swelling and susceptibility, concerning for hemorrhage, most prominent in the right temporal lobe. No mass effect or significant midline shift. 2. Additional punctate foci of restricted diffusion in the bilateral frontal and parietal lobes, as well as the left occipital lobe, likely embolic. MRA FINDINGS: 1. Resolution of previously noted occlusion of the right ICA, status post thrombectomy. Moderate focal stenosis in the proximal right cavernous carotid, secondary to calcification. 2. Moderate to severe narrowing in the left A2. 3. Poor visualization of the left V4; reconstitution after the takeoff of the left PICA may  be retrograde. 4. Significant narrowing of the origin of the left superior cerebellar artery, which is poorly visualized. This is likely partially supplied by the left posterior communicating artery and left P1. Electronically Signed   By: Merilyn Baba M.D.   On: 09/15/2021 13:25   MR BRAIN WO CONTRAST  Result Date: 09/15/2021 CLINICAL DATA:  Stroke, follow-up EXAM: MRI HEAD WITHOUT CONTRAST MRA HEAD WITHOUT CONTRAST TECHNIQUE: Multiplanar, multi-echo pulse sequences of the brain and surrounding structures were acquired without intravenous contrast. Angiographic images of the Circle of Willis were acquired using MRA technique without intravenous contrast. COMPARISON:  No prior MRI, correlation is made with CT head 09/15/2021 and CT head and CTA head and neck 09/14/2021. FINDINGS: MRI HEAD FINDINGS Brain: Increased signal on diffusion-weighted imaging with ADC correlate  in the right MCA and PCA territory, particularly in the right temporal lobe, anterior to posterior, right hippocampus and medial temporal lobe, right occipital lobe, inferior right parietal lobe, right thalamus, and right basal ganglia, consistent with the known right MCA and PCA territory infarct; these areas also demonstrate increased T2 hyperintense signal and gyral swelling, consistent with edema. Increased signal on susceptibility weighted imaging is seen most prominently in the right temporal lobe (series 9, image 36), as well as smaller areas in the right occipital lobe (series 9, image 48), right parietal lobe (series 9, image 51), and right basal ganglia (series 9, image 49). Additional punctate foci of restricted diffusion with ADC correlates in the left frontal (series 3, image 39), left parietal (series 3, image 38), left occipital (series 3, images 19 and 20), right frontal (series 3, image 33 and 34), and more superior right parietal lobe (series 3, image 32). Mild local mass effect in the right temporal lobe without significant midline shift. T2 hyperintense signal in the periventricular white matter, likely the sequela of chronic small vessel ischemic disease. Remote left cerebellar and bilateral basal ganglia infarcts. Vascular: Please see MRA findings below. Skull and upper cervical spine: Normal marrow signal. Redemonstrated advanced C1-C2 degenerative changes, which cause severe spinal canal stenosis. Sinuses/Orbits: Mucosal thickening in the right-greater-than-left maxillary sinus and left sphenoid sinus. Air-fluid level in the right sphenoid sinus. Mucosal thickening in the ethmoid air cells. The orbits are unremarkable. Other: Fluid in the left-greater-than-right mastoid air cells. MRA HEAD FINDINGS Anterior circulation: Both internal carotid arteries are patent to the termini, with patency of the right ICA new compared to the prior CTA. Moderate focal stenosis in the proximal right cavernous  carotid, secondary to calcified plaques. Left A1 patent. Aplastic right A1. Normal anterior communicating artery. Moderate to severe narrowing in the left A2 (series 4, image 119). Anterior cerebral arteries are otherwise patent to their distal aspects. Mild left M1 stenosis. No right M1 stenosis or occlusion. Normal MCA bifurcations. Distal MCA branches perfused and symmetric. Posterior circulation: Proximal left V4 is not visualized, with reconstitution after the takeoff of the left PICA. The right V4 is patent. Basilar patent to its distal aspect, although there is mild-to-moderate narrowing after the takeoff of the AICAs. Patent right superior cerebellar artery, originating from the basilar. The left superior cerebellar artery is significantly narrowed at its origin and is poorly visualized, and is likely partially supplied by the left posterior communicating artery and left P1. The left proximal P1 is hypoplastic, with a suspected common origin with the left SCA. The majority of flow is supplied by the left posterior communicating artery. Mild narrowing in the proximal left P2. Left PCA is otherwise patent.  The right PCA originates from the basilar artery and a somewhat diminutive right posterior communicating artery. The distal right PCA segments are patent. Anatomic variants: None significant IMPRESSION: MRI FINDINGS: 1. Acute infarcts in the right MCA and PCA territory, most prominent in the right temporal lobe and right occipital lobe, with involvement of the right inferior parietal lobe, right thalamus and right basal ganglia. These areas also demonstrate gyral swelling and susceptibility, concerning for hemorrhage, most prominent in the right temporal lobe. No mass effect or significant midline shift. 2. Additional punctate foci of restricted diffusion in the bilateral frontal and parietal lobes, as well as the left occipital lobe, likely embolic. MRA FINDINGS: 1. Resolution of previously noted occlusion  of the right ICA, status post thrombectomy. Moderate focal stenosis in the proximal right cavernous carotid, secondary to calcification. 2. Moderate to severe narrowing in the left A2. 3. Poor visualization of the left V4; reconstitution after the takeoff of the left PICA may be retrograde. 4. Significant narrowing of the origin of the left superior cerebellar artery, which is poorly visualized. This is likely partially supplied by the left posterior communicating artery and left P1. Electronically Signed   By: Merilyn Baba M.D.   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 No intravenous contrast was administered. COMPARISON:  MRI head 09/07/2021, correlation is also made with CTA head neck 09/14/2021. FINDINGS: Alignment: Physiologic. Vertebrae: No acute fracture or suspicious osseous lesion. Extensive, diffuse idiopathic skeletal hyperostosis, with bulky ventral spurs, extending from the inferior endplate of C2 to the superior endplate of C7. 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 Significant widening of the atlantodental interval, measuring up to 8 mm and best seen on the same-day MRI of the head, likely secondary to ligamentous hypertrophy. Additional ossification and thickening of the posterior longitudinal ligament narrows the spinal canal at C1-C2. Congenitally short pedicles, which narrow the AP diameter of the spinal canal. Cord: Normal cord signal. Multifocal cord flattening at C1-C2, C3-C4, C4-C5, and C5-C6. Otherwise normal morphology. Posterior Fossa, vertebral arteries, paraspinal tissues: Evidence of remote infarcts in the pons and left cerebellum. Poor visualization of the left vertebral  artery. Disc levels: C1-C2: Incompletely evaluated in the axial plane given the field of view, which extends to just below C1; the field-of-view of the MRI brain extends to just above C1. On sagittal images, there is severe spinal canal stenosis, with cord flattening and compression, but without cord signal abnormality. C2-C3: Mild disc bulge. Facet and uncovertebral hypertrophy, with additional ossification medial to the left facets. Mild spinal canal stenosis. Moderate to severe left and mild-to-moderate right neural foraminal narrowing. C3-C4: Disc height loss and moderate disc bulge, which indents and deforms the ventral cord. Facet and uncovertebral hypertrophy. Severe spinal canal stenosis with cord flattening. Mild bilateral neural foraminal narrowing. C4-C5: Broad-based disc bulge, which indents the ventral cord. Severe spinal canal stenosis with cord flattening. Uncovertebral and facet hypertrophy. Mild bilateral neural foraminal narrowing. C5-C6: Disc bulge which indents the ventral cord and causes cord flattening. Uncovertebral and facet arthropathy. Mild bilateral neural foraminal narrowing. C6-C7: Disc bulge. Facet and uncovertebral hypertrophy. Moderate spinal canal stenosis. Mild-to-moderate bilateral neural foraminal narrowing. C7-T1: No spinal canal stenosis or neural foraminal narrowing. IMPRESSION: 1. C1-C2 severe spinal  canal stenosis, with cord flattening and compression, without cord signal abnormality. This is secondary to ligamentous thickening, which widens the atlantodental interval, with additional ossification thickening of the posterior longitudinal ligament. 2. C3-C4, C4-C5, and C5-C6 severe spinal canal stenosis with cord flattening. No cord signal abnormality is seen at these levels. 3. C2-C3 mild spinal canal stenosis with moderate to severe left and mild-to-moderate right neural foraminal narrowing. 4. C6-C7 moderate spinal canal stenosis and mild-to-moderate bilateral neural  foraminal narrowing. 5. Electronically Signed   By: Merilyn Baba M.D.   On: 09/15/2021 14:20    PHYSICAL EXAM General: Well-developed, well-nourished patient with right AKA, intubated but in no acute distress  Neurological:  PERRL, does not blink to threat on left.  Right gaze deviation.  Cough and gag reflexes present.  Able to move RUE and BLE in response to commands.  5/5 strength in RUE and RLE, 0/5 strength in LUE, 3/5 strength in LLE.  ASSESSMENT/PLAN Jenna Brown is a 70 y.o. female with history of ESRD on IHD, NSTEMI, CAD, HTN, HLD, right AKA, stroke and T2DM presenting with left sided weakness, left facial droop and slurred speech.  She was brought to the ED.  TNK was not given as she presented outside the window.  CT angiography revealed LVO in the proximal right ICA with perfusion study showing core infarct of 29cc and penumbra of 168cc in right MCA and PCA territories. Mechanical thrombectomy was performed with TICI 3 flow achieved.  She has remained hemodynamically stable after her procedure and remains intubated at this time,.   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 Bilateral basal ganglia and left cerbellar chronic infarcts, chronic C1-C2 subluxation. ASPECTS 10.    CTA head & neck emergent LVO at proximal right ICA, isolated right M1 segment and fetal type right PCA, left vertebral artery occlusion, stenotic right vertebral artery, high grade mid basilar stenosis CT perfusion core infarct of 29cc and penumbra of 168cc in right MCA and PCA territories Post IR CT mild hyperattenuation in right posterior MCA territory and right PCA territory, likely contrast material, small subarachnoid hyperdensity over posterior right hemisphere, old left cerebellar infarct MRI  acute infarcts in right MCA and PCA territory, punctate foci of restricted diffusion in  bilateral forntal and parietal lobes as well as left occipital lobe MRA  resolution of right ICA occlusion, poor visulazation of left V4, moderate to severe narrowing of left A2, narrowing of origin of left superior cerebellar artery 2D Echo pending LDL 74 HgbA1c 5.5 VTE prophylaxis - SCDs    Diet   Diet NPO time specified   aspirin 81 mg daily and Brilinta (ticagrelor) 90 mg bid prior to admission, now on aspirin 325 mg daily.  Therapy recommendations:  pending Disposition:  pending  Hypertension Home meds:  coreg 3.125 mg BID Stable- was hypotensive but stable off vasopressors now SBP 120-140 Long-term BP goal normotensive  Hyperlipidemia Home meds:  atorvastatin 80 mg daily, resumed in hospital LDL 74, goal < 70 Add zetia 10 mg daily  Continue statin at discharge  Respiratory failure Patient remains intubated after thrombectomy Will attempt to wean and hopefully extubate tomorrow Management per CCM  Other Stroke Risk Factors Advanced Age >/= 47  Former cigarette smoker Hx stroke Coronary artery disease  Other Active Problems ESRD on IHD Appreciate nephrology recommendations IHD last night  Hospital  day # [place] , MSN, AGACNP-BC [facility] See Amion for schedule and pager information 09/15/2021 5:34 PM    To contact Stroke Continuity provider, please refer to [email or web address] After hours, contact General Neurology

## 2021-09-15 NOTE — Significant Event (Signed)
Care Contact Phone Call  [name], [name], at [phone]. They did not answer the phone at this time and were not able to be updated on Jenna Brown's clinical status and plan. A message was left.  [name]., MSN, APRN, AGACNP-BC [facility]  09/15/2021 , 1:15 PM  Please see [email or web address] for pager details  If no response, please call [phone] After hours, please call Elink at [phone]

## 2021-09-15 NOTE — Progress Notes (Signed)
[facility] Progress Note  Subjective: pt seen in ICU, trying to sit up  Vitals:   09/15/21 0645 09/15/21 0700 09/15/21 0800 09/15/21 0844  BP: 140/77 123/82  131/71  Pulse:    (!) 124  Resp: 16 14  (!) 27  Temp:   99.1 F (37.3 C)   TempSrc:   Oral   SpO2:    100%  Weight:      Height:        Exam: Gen on vent, trying to get OOB Sclera anicteric, throat w/ ETT No jvd or bruits Chest clear anterior/ lateral RRR no RG Abd soft ntnd no mass or ascites +bs MS no joint effusions or deformity Ext no LE or UE edema Neuro is on vent, nonfocal, awake  RUA AVG +bruit      Home meds include - asa, lipitor, coreg 3.125 bid, hydralazine tid, insulin glargine, imdur, sl ntg, percocet, seroquel, renvela 2 ac tid, brilinta, vits/ supps        OP HD: [facility] MWF    3h  70kg  F200 2/2.5 bath  350/600   Hep 1000 then 500u/hr  - aranesp 35 ug q wk  - venofer 62m weekly   - hectorol 2.5 ug IV tiw  - last HD 12/23      Na 139  K 5.7  BUN 48  creat 6.6      CXR 12/27 - IMPRESSION:  New perihilar and left lower lobe airspace opacity suspicious for aspiration or edema.     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 Pt underwent mechanical thrombectomy of R ICA today.  VDRF - on vent , sedated Pulm infiltrates - R upper lung and bibasilar by CXR. Per CCM.  ESRD - on HD MWF. HD here last night, plan HD again today to get on schedule.   H/o CM - LVEF 20-25% last echo HTN - uncontrolled initially, wnl now Volume - no vol excess on exam. Up 4kg by wts today. Goal UF 1-2 L as tol w/ HD today. CXR findings more c/w pna/ aspiration than edema.  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, will add phos, cont vdra w/ HD mwf     Rob Aysiah Jurado 09/15/2021, 9:42 AM   Recent Labs  Lab 09/14/21 0909 09/14/21 0915 09/14/21 1159 09/15/21 0429  K 5.7* 5.6* 6.0* 3.0*  BUN 48* 50*  --  19   CREATININE 6.62* 6.80*  --  2.73*  CALCIUM 9.4  --   --  8.1*  HGB 10.8* 12.6 9.9* 8.7*   Inpatient medications:  aspirin  325 mg Oral Once   Or   aspirin  300 mg Rectal Once   atorvastatin  80 mg Per Tube Daily   chlorhexidine gluconate (MEDLINE KIT)  15 mL Mouth Rinse BID   Chlorhexidine Gluconate Cloth  6 each Topical Q0600   darbepoetin (ARANESP) injection - DIALYSIS  40 mcg Intravenous Q Wed-HD   insulin aspart  0-15 Units Subcutaneous Q4H   mouth rinse  15 mL Mouth Rinse 10 times per day   pantoprazole sodium  40 mg Per Tube QHS   polyethylene glycol  17 g Per Tube Daily   QUEtiapine  50 mg Per Tube BID   sevelamer carbonate  1,600 mg Per Tube TID WC    sodium chloride 50 mL/hr at 09/15/21 0700   sodium  chloride     sodium chloride     sodium chloride Stopped (09/14/21 1609)   norepinephrine (LEVOPHED) Adult infusion Stopped (09/15/21 0245)   propofol (DIPRIVAN) infusion 25 mcg/kg/min (09/15/21 0700)   sodium chloride, sodium chloride, acetaminophen **OR** acetaminophen (TYLENOL) oral liquid 160 mg/5 mL **OR** acetaminophen, alteplase, fentaNYL (SUBLIMAZE) injection, fentaNYL (SUBLIMAZE) injection, heparin, lidocaine (PF), lidocaine-prilocaine, ondansetron (ZOFRAN) IV, pentafluoroprop-tetrafluoroeth, senna-docusate

## 2021-09-15 NOTE — Progress Notes (Addendum)
NAME:  Jenna Brown, MRN:  [id number], DOB:  11/20/1950, LOS: 1 ADMISSION DATE:  09/14/2021, CONSULTATION DATE:  09/14/2021 REFERRING MD: Dr. Debbrah Alar, CHIEF COMPLAINT: Acute stroke s/p thrombectomy  History of Present Illness:  70 year old with ESRD on hemodialysis, coronary artery disease, peripheral vascular disease status post AKA continue with acute right ICA stroke s/p mechanical thrombectomy.  PCCM consulted for help with vent management  She had a recent admission for COVID-19 infection treated with monotherapy or as an outpatient.  She then developed chest pain, possible non-ST elevation MI  Pertinent  Medical History    has a past medical history of Anemia, Anxiety, Arthritis, Cataract, CKD (chronic kidney disease), stage IV ([place]), Coronary artery disease (2016), Full dentures, GERD (gastroesophageal reflux disease), H/O hiatal hernia, Headache(784.0), Hemorrhoids, AKA (above knee amputation), right ([place]), Hyperlipidemia, Hypertension, Migraines, Myocardial infarction ([place]) (1990's), NSTEMI (non-ST elevated myocardial infarction) ([place]), Peripheral vascular disease ([place]), PONV (postoperative nausea and vomiting), Stroke ([place]), Type II diabetes mellitus ([place]), and Vertigo.   Significant Hospital Events: Including procedures, antibiotic start and stop dates in addition to other pertinent events   12/27 PCCM consult   Interim History / Subjective:  Tmax 99.9  Intubated  Propofol 25 MIVF 50  493 off in HD overnight  Unable to obtain subjective evaluation due to patient status  Objective   Blood pressure 123/82, pulse 67, temperature (!) 97.5 F (36.4 C), temperature source Temporal, resp. rate 14, height 5\' 4"  (1.626 m), weight 74.2 kg, SpO2 99 %.    Vent Mode: PRVC FiO2 (%):  [40 %-100 %] 40 % Set Rate:  [16 bmp] 16 bmp Vt Set:  [430 mL] 430 mL PEEP:  [5 cmH20] 5 cmH20 Plateau Pressure:  [17 cmH20-20 cmH20] 17 cmH20   Intake/Output Summary (Last 24 hours) at  09/15/2021 0808 Last data filed at 09/15/2021 0700 Gross per 24 hour  Intake 1636.2 ml  Output -388 ml  Net 2024.2 ml   Filed Weights   09/14/21 0900 09/14/21 0959 09/15/21 0240  Weight: 71.2 kg 71.2 kg 74.2 kg   General: In bed, NAD, appears comfortable, elderly HEENT: MM pink/moist, anicteric, atraumatic Neuro: RASS -3, PERRL 71mm, GCS 8t, purposeful, opens eyes to pain CV: S1S2, ST with PVC, no m/r/g appreciated PULM:  air movement in all lobes, trachea midline, chest expansion symmetric GI: soft, bsx4 active, non-tender   Extremities: cool/dry, no pretibial edema, capillary refill less than 3 seconds, RT AKA  Skin:  no rashes or lesions noted  HGB 9.9>8.7 PLT 167>145 K 6.0>3.0 Creat 6.8>2.3 BUN 50>19 Lipid panel WNL A1C 5.5  Resolved Hospital Problem list     Assessment & Plan:  Acute ischemic stroke with occlusion of proximal right internal carotid S/p mechanical thrombectomy, (TICI 3), CT head 12/28 with small amount of subarachnoid hyperdensity in posterior right hemisphere, Contrast suspected. -Management of stroke and workup per neurology -On ASA, statin -Follow up echo -Follow up MRI  Acute respiratory failure with hypoxia COVID-19 Positive Intubated for mechanical thrombectomy, ABG 7.3/44/101/22 on 12/27 on 70% FiO2. COVID+ 1 month ago and on 12/27. Do not suspect active COVID infection contributing to resp failure.  -LTVV strategy with tidal volumes of 4-8 cc/kg ideal body weight -Goal plateau pressures less than 30 and driving pressures less than 15 -Wean PEEP/FiO2 for SpO2 92-98% -VAP bundle -Daily SAT and SBT, hope to move toward extubation today -PAD bundle with Propofol gtt and fentanyl push -RASS goal 0 to -1 -Follow intermittent CXR and ABG PRN  End-stage renal disease on hemodialysis (MWF) Hyperkalemia- Resolved S/p HD overnight. K 6.0>3.0 -Nephrology consulted and following -Follow up on AM BMP  Severe ischemic cardiomyopathy HX HTN HX  HLD EF 20-25% on last echocardiogram, off pressors -Continue Holding Coreg, Imdur, Brilinta, hydralazine  -Will monitor for resumption of home antihypertensives  DM2 -Blood Glucose goal 140-180. -SSI  GERD -PPI  Best Practice (right click and "Reselect all SmartList Selections" daily)   Diet/type: NPO w/ meds via tube DVT prophylaxis: SCD GI prophylaxis: PPI Lines: N/A Foley:  N/A Code Status:  full code Last date of multidisciplinary goals of care discussion [ per primary]   Critical care time: 35 minutes   [name]., MSN, APRN, AGACNP-BC [facility]  09/15/2021 , 8:08 AM  Please see [email or web address] for pager details  If no response, please call [phone] After hours, please call Elink at [phone]

## 2021-09-15 NOTE — Progress Notes (Signed)
RT NOTE: patient placed on CPAP/PSV of 15/5 at [phone].  Patient currently tolerating well.  RT will continue to monitor and wean as tolerated.

## 2021-09-15 NOTE — Progress Notes (Signed)
PT Cancellation Note  Patient Details Name: PEREL HAUSCHILD MRN: [id number] DOB: 05/19/1951   Cancelled Treatment:    Reason Eval/Treat Not Completed: Patient not medically ready  Fate Caster A. Gilford Rile PT, DPT Acute Rehabilitation Services Pager [phone] Office [phone]    Linna Hoff 09/15/2021, 10:43 AM

## 2021-09-15 NOTE — Progress Notes (Signed)
SLP Cancellation Note  Patient Details Name: Jenna Brown MRN: [id number] DOB: February 20, 1951   Cancelled treatment:       Reason Eval/Treat Not Completed: Patient not medically ready. WIll follow for readiness.    Tahjanae Blankenburg, Katherene Ponto 09/15/2021, 7:21 AM

## 2021-09-15 NOTE — Progress Notes (Signed)
RT NOTES: Pt transported from MRI to room 4N31 on vent without incident.

## 2021-09-15 NOTE — Progress Notes (Signed)
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   Subjective: Patient remains intubated/sedated. Currently undergoing dialysis at the bedside.   Allergies: Plavix [clopidogrel bisulfate], Codeine, Hydrocodone, Losartan, and Tylenol with codeine #3 [acetaminophen-codeine]  Medications: Prior to Admission medications   Medication Sig Start Date End Date Taking? Authorizing Provider  acetaminophen (TYLENOL) 500 MG tablet Take 500 mg by mouth at bedtime.   Yes [provider]  aspirin 81 MG chewable tablet Chew 81 mg by mouth daily.   Yes [provider]  atorvastatin (LIPITOR) 80 MG tablet Take 80 mg by mouth daily.  01/09/18  Yes [provider]  cholecalciferol (VITAMIN D) 25 MCG (1000 UT) tablet Take 1,000 Units by mouth daily.   Yes [provider]  docusate sodium (COLACE) 100 MG capsule Take 100 mg by mouth daily as needed for mild constipation.   Yes [provider]  hydrALAZINE (APRESOLINE) 10 MG tablet Take 1 tablet by mouth three times daily on non-dialysis days 08/19/21  Yes Vann, Jessica U, DO  Insulin Glargine (BASAGLAR KWIKPEN) 100 UNIT/ML Inject 20 Units into the skin 2 (two) times daily. Taking 22 units at bedtime   Yes [provider]  isosorbide mononitrate (IMDUR) 30 MG 24 hr tablet Take 1/2 tablet (15 mg total) by mouth daily. 08/20/21  Yes Vann, Jessica U, DO  nitroGLYCERIN (NITROSTAT) 0.4 MG SL tablet DISSOLVE 1 TABLET BY MOUTH UNDER THE TONGUE EVERY 5 MINUTES FOR 3 DOSES AS NEEDED FOR CHEST PAIN Patient taking differently: 0.4 mg every 5 (five) minutes as needed for chest pain. 04/28/21  Yes Skeet Latch, MD  oxyCODONE-acetaminophen (PERCOCET/ROXICET) 5-325 MG tablet Take 1 tablet by mouth every 8 (eight) hours as  needed for severe pain. 08/30/21  Yes Davonna Belling, MD  QUEtiapine (SEROQUEL) 50 MG tablet Take 50 mg by mouth 2 (two) times daily. 08/27/21  Yes [provider]  RENVELA 800 MG tablet Take 1,600 mg by mouth 3 (three) times daily with meals.   Yes [provider]  zinc sulfate 220 (50 Zn) MG capsule Take 1 capsule (220 mg total) by mouth daily. 08/20/21  Yes Geradine Girt, DO  carvedilol (COREG) 3.125 MG tablet Take 1 tablet (3.125 mg total) by mouth 2 (two) times daily with a meal. 08/19/21   Geradine Girt, DO  ticagrelor (BRILINTA) 90 MG TABS tablet Take 1 tablet (90 mg total) by mouth 2 (two) times daily. 07/13/20   [name], MD     Vital Signs: BP 101/60    Pulse (!) 111    Temp (P) 99.9 F (37.7 C) (Axillary)    Resp (!) 21    Ht 5\' 4"  (1.626 m)    Wt (P) 157 lb 13.6 oz (71.6 kg)    SpO2 100%    BMI (P) 27.09 kg/m   Physical Exam Constitutional:      Comments: Intubated/sedated  Cardiovascular:     Comments: Left groin vascular site is clean, soft and dry per bedside RN.    Imaging: CT HEAD WO CONTRAST  Result Date: 09/15/2021 CLINICAL DATA:  Stroke follow-up EXAM: CT HEAD WITHOUT CONTRAST TECHNIQUE: Contiguous axial images were obtained from the base of the skull through the vertex without intravenous contrast. COMPARISON:  None. FINDINGS: Brain: Mild hyperattenuation within the posterior right MCA territory and right PCA territory.  No midline shift or other mass effect. There is periventricular hypoattenuation compatible with chronic microvascular disease. Old left cerebellar infarct. Small amount of subarachnoid blood over the posterior right hemisphere Vascular: Atherosclerotic calcification of the internal carotid arteries at the skull base. No abnormal hyperdensity of the major intracranial arteries or dural venous sinuses. Skull: The visualized skull base, calvarium and extracranial soft tissues are normal. Sinuses/Orbits: No fluid levels or  advanced mucosal thickening of the visualized paranasal sinuses. No mastoid or middle ear effusion. The orbits are normal. IMPRESSION: 1. Mild hyperattenuation within the posterior right MCA territory and right PCA territory, likely contrast material. 2. Small amount of subarachnoid hyperdensity over the posterior right hemisphere is favored to be extravasated contrast rather than subarachnoid blood. 3. Old left cerebellar infarct. Electronically Signed   By: Ulyses Jarred M.D.   On: 09/15/2021 00:26   MR ANGIO HEAD WO CONTRAST  Result Date: 09/15/2021 CLINICAL DATA:  Stroke, follow-up EXAM: MRI HEAD WITHOUT CONTRAST MRA HEAD WITHOUT CONTRAST TECHNIQUE: Multiplanar, multi-echo pulse sequences of the brain and surrounding structures were acquired without intravenous contrast. Angiographic images of the Circle of Willis were acquired using MRA technique without intravenous contrast. COMPARISON:  No prior MRI, correlation is made with CT head 09/15/2021 and CT head and CTA head and neck 09/14/2021. FINDINGS: MRI HEAD FINDINGS Brain: Increased signal on diffusion-weighted imaging with ADC correlate in the right MCA and PCA territory, particularly in the right temporal lobe, anterior to posterior, right hippocampus and medial temporal lobe, right occipital lobe, inferior right parietal lobe, right thalamus, and right basal ganglia, consistent with the known right MCA and PCA territory infarct; these areas also demonstrate increased T2 hyperintense signal and gyral swelling, consistent with edema. Increased signal on susceptibility weighted imaging is seen most prominently in the right temporal lobe (series 9, image 36), as well as smaller areas in the right occipital lobe (series 9, image 48), right parietal lobe (series 9, image 51), and right basal ganglia (series 9, image 49). Additional punctate foci of restricted diffusion with ADC correlates in the left frontal (series 3, image 39), left parietal (series 3,  image 38), left occipital (series 3, images 19 and 20), right frontal (series 3, image 33 and 34), and more superior right parietal lobe (series 3, image 32). Mild local mass effect in the right temporal lobe without significant midline shift. T2 hyperintense signal in the periventricular white matter, likely the sequela of chronic small vessel ischemic disease. Remote left cerebellar and bilateral basal ganglia infarcts. Vascular: Please see MRA findings below. Skull and upper cervical spine: Normal marrow signal. Redemonstrated advanced C1-C2 degenerative changes, which cause severe spinal canal stenosis. Sinuses/Orbits: Mucosal thickening in the right-greater-than-left maxillary sinus and left sphenoid sinus. Air-fluid level in the right sphenoid sinus. Mucosal thickening in the ethmoid air cells. The orbits are unremarkable. Other: Fluid in the left-greater-than-right mastoid air cells. MRA HEAD FINDINGS Anterior circulation: Both internal carotid arteries are patent to the termini, with patency of the right ICA new compared to the prior CTA. Moderate focal stenosis in the proximal right cavernous carotid, secondary to calcified plaques. Left A1 patent. Aplastic right A1. Normal anterior communicating artery. Moderate to severe narrowing in the left A2 (series 4, image 119). Anterior cerebral arteries are otherwise patent to their distal aspects. Mild left M1 stenosis. No right M1 stenosis or occlusion. Normal MCA bifurcations. Distal MCA branches perfused and symmetric. Posterior circulation: Proximal left V4 is not visualized, with reconstitution after the takeoff of the  left PICA. The right V4 is patent. Basilar patent to its distal aspect, although there is mild-to-moderate narrowing after the takeoff of the AICAs. Patent right superior cerebellar artery, originating from the basilar. The left superior cerebellar artery is significantly narrowed at its origin and is poorly visualized, and is likely partially  supplied by the left posterior communicating artery and left P1. The left proximal P1 is hypoplastic, with a suspected common origin with the left SCA. The majority of flow is supplied by the left posterior communicating artery. Mild narrowing in the proximal left P2. Left PCA is otherwise patent. The right PCA originates from the basilar artery and a somewhat diminutive right posterior communicating artery. The distal right PCA segments are patent. Anatomic variants: None significant IMPRESSION: MRI FINDINGS: 1. Acute infarcts in the right MCA and PCA territory, most prominent in the right temporal lobe and right occipital lobe, with involvement of the right inferior parietal lobe, right thalamus and right basal ganglia. These areas also demonstrate gyral swelling and susceptibility, concerning for hemorrhage, most prominent in the right temporal lobe. No mass effect or significant midline shift. 2. Additional punctate foci of restricted diffusion in the bilateral frontal and parietal lobes, as well as the left occipital lobe, likely embolic. MRA FINDINGS: 1. Resolution of previously noted occlusion of the right ICA, status post thrombectomy. Moderate focal stenosis in the proximal right cavernous carotid, secondary to calcification. 2. Moderate to severe narrowing in the left A2. 3. Poor visualization of the left V4; reconstitution after the takeoff of the left PICA may be retrograde. 4. Significant narrowing of the origin of the left superior cerebellar artery, which is poorly visualized. This is likely partially supplied by the left posterior communicating artery and left P1. Electronically Signed   By: Merilyn Baba M.D.   On: 09/15/2021 13:25   MR BRAIN WO CONTRAST  Result Date: 09/15/2021 CLINICAL DATA:  Stroke, follow-up EXAM: MRI HEAD WITHOUT CONTRAST MRA HEAD WITHOUT CONTRAST TECHNIQUE: Multiplanar, multi-echo pulse sequences of the brain and surrounding structures were acquired without intravenous  contrast. Angiographic images of the Circle of Willis were acquired using MRA technique without intravenous contrast. COMPARISON:  No prior MRI, correlation is made with CT head 09/15/2021 and CT head and CTA head and neck 09/14/2021. FINDINGS: MRI HEAD FINDINGS Brain: Increased signal on diffusion-weighted imaging with ADC correlate in the right MCA and PCA territory, particularly in the right temporal lobe, anterior to posterior, right hippocampus and medial temporal lobe, right occipital lobe, inferior right parietal lobe, right thalamus, and right basal ganglia, consistent with the known right MCA and PCA territory infarct; these areas also demonstrate increased T2 hyperintense signal and gyral swelling, consistent with edema. Increased signal on susceptibility weighted imaging is seen most prominently in the right temporal lobe (series 9, image 36), as well as smaller areas in the right occipital lobe (series 9, image 48), right parietal lobe (series 9, image 51), and right basal ganglia (series 9, image 49). Additional punctate foci of restricted diffusion with ADC correlates in the left frontal (series 3, image 39), left parietal (series 3, image 38), left occipital (series 3, images 19 and 20), right frontal (series 3, image 33 and 34), and more superior right parietal lobe (series 3, image 32). Mild local mass effect in the right temporal lobe without significant midline shift. T2 hyperintense signal in the periventricular white matter, likely the sequela of chronic small vessel ischemic disease. Remote left cerebellar and bilateral basal ganglia infarcts. Vascular: Please see  MRA findings below. Skull and upper cervical spine: Normal marrow signal. Redemonstrated advanced C1-C2 degenerative changes, which cause severe spinal canal stenosis. Sinuses/Orbits: Mucosal thickening in the right-greater-than-left maxillary sinus and left sphenoid sinus. Air-fluid level in the right sphenoid sinus. Mucosal  thickening in the ethmoid air cells. The orbits are unremarkable. Other: Fluid in the left-greater-than-right mastoid air cells. MRA HEAD FINDINGS Anterior circulation: Both internal carotid arteries are patent to the termini, with patency of the right ICA new compared to the prior CTA. Moderate focal stenosis in the proximal right cavernous carotid, secondary to calcified plaques. Left A1 patent. Aplastic right A1. Normal anterior communicating artery. Moderate to severe narrowing in the left A2 (series 4, image 119). Anterior cerebral arteries are otherwise patent to their distal aspects. Mild left M1 stenosis. No right M1 stenosis or occlusion. Normal MCA bifurcations. Distal MCA branches perfused and symmetric. Posterior circulation: Proximal left V4 is not visualized, with reconstitution after the takeoff of the left PICA. The right V4 is patent. Basilar patent to its distal aspect, although there is mild-to-moderate narrowing after the takeoff of the AICAs. Patent right superior cerebellar artery, originating from the basilar. The left superior cerebellar artery is significantly narrowed at its origin and is poorly visualized, and is likely partially supplied by the left posterior communicating artery and left P1. The left proximal P1 is hypoplastic, with a suspected common origin with the left SCA. The majority of flow is supplied by the left posterior communicating artery. Mild narrowing in the proximal left P2. Left PCA is otherwise patent. The right PCA originates from the basilar artery and a somewhat diminutive right posterior communicating artery. The distal right PCA segments are patent. Anatomic variants: None significant IMPRESSION: MRI FINDINGS: 1. Acute infarcts in the right MCA and PCA territory, most prominent in the right temporal lobe and right occipital lobe, with involvement of the right inferior parietal lobe, right thalamus and right basal ganglia. These areas also demonstrate gyral  swelling and susceptibility, concerning for hemorrhage, most prominent in the right temporal lobe. No mass effect or significant midline shift. 2. Additional punctate foci of restricted diffusion in the bilateral frontal and parietal lobes, as well as the left occipital lobe, likely embolic. MRA FINDINGS: 1. Resolution of previously noted occlusion of the right ICA, status post thrombectomy. Moderate focal stenosis in the proximal right cavernous carotid, secondary to calcification. 2. Moderate to severe narrowing in the left A2. 3. Poor visualization of the left V4; reconstitution after the takeoff of the left PICA may be retrograde. 4. Significant narrowing of the origin of the left superior cerebellar artery, which is poorly visualized. This is likely partially supplied by the left posterior communicating artery and left P1. Electronically Signed   By: Merilyn Baba M.D.   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 No intravenous contrast was administered. COMPARISON:  MRI head 09/07/2021, correlation is also made with CTA head neck 09/14/2021. FINDINGS: Alignment: Physiologic. Vertebrae: No acute fracture or suspicious osseous lesion. Extensive, diffuse idiopathic skeletal hyperostosis, with bulky ventral spurs, extending from the inferior endplate of C2 to the superior endplate of C7. 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 Significant widening of the atlantodental interval, measuring up to 8 mm and best  seen on the same-day MRI of the head, likely secondary to ligamentous hypertrophy. Additional ossification and thickening of the posterior longitudinal ligament narrows the spinal canal at C1-C2.  Congenitally short pedicles, which narrow the AP diameter of the spinal canal. Cord: Normal cord signal. Multifocal cord flattening at C1-C2, C3-C4, C4-C5, and C5-C6. Otherwise normal morphology. Posterior Fossa, vertebral arteries, paraspinal tissues: Evidence of remote infarcts in the pons and left cerebellum. Poor visualization of the left vertebral artery. Disc levels: C1-C2: Incompletely evaluated in the axial plane given the field of view, which extends to just below C1; the field-of-view of the MRI brain extends to just above C1. On sagittal images, there is severe spinal canal stenosis, with cord flattening and compression, but without cord signal abnormality. C2-C3: Mild disc bulge. Facet and uncovertebral hypertrophy, with additional ossification medial to the left facets. Mild spinal canal stenosis. Moderate to severe left and mild-to-moderate right neural foraminal narrowing. C3-C4: Disc height loss and moderate disc bulge, which indents and deforms the ventral cord. Facet and uncovertebral hypertrophy. Severe spinal canal stenosis with cord flattening. Mild bilateral neural foraminal narrowing. C4-C5: Broad-based disc bulge, which indents the ventral cord. Severe spinal canal stenosis with cord flattening. Uncovertebral and facet hypertrophy. Mild bilateral neural foraminal narrowing. C5-C6: Disc bulge which indents the ventral cord and causes cord flattening. Uncovertebral and facet arthropathy. Mild bilateral neural foraminal narrowing. C6-C7: Disc bulge. Facet and uncovertebral hypertrophy. Moderate spinal canal stenosis. Mild-to-moderate bilateral neural foraminal narrowing. C7-T1: No spinal canal stenosis or neural foraminal narrowing. IMPRESSION: 1. C1-C2 severe spinal canal stenosis, with cord flattening and compression, without cord signal abnormality. This is secondary to ligamentous thickening, which widens the atlantodental interval, with additional ossification thickening of the posterior  longitudinal ligament. 2. C3-C4, C4-C5, and C5-C6 severe spinal canal stenosis with cord flattening. No cord signal abnormality is seen at these levels. 3. C2-C3 mild spinal canal stenosis with moderate to severe left and mild-to-moderate right neural foraminal narrowing. 4. C6-C7 moderate spinal canal stenosis and mild-to-moderate bilateral neural foraminal narrowing. 5. Electronically Signed   By: Merilyn Baba M.D.   On: 09/15/2021 14:20   DG Chest Portable 1 View  Result Date: 09/14/2021 CLINICAL DATA:  Code stroke earlier today. Intubation and orogastric tube placement. EXAM: PORTABLE CHEST 1 VIEW COMPARISON:  Radiographs 08/17/2021 and 08/16/2021. FINDINGS: 1217 hours. Interval intubation. Tip of the endotracheal tube is approximately 2.7 cm above the carina. Orogastric tube projects below the diaphragm, tip not visualized. There are lower lung volumes with increased perihilar left lower lobe airspace opacities bilaterally. No pneumothorax or significant pleural effusion identified. The heart size and mediastinal contours are stable. IMPRESSION: 1. Satisfactory position of the endotracheal and enteric tubes. 2. New perihilar and left lower lobe airspace opacity suspicious for aspiration or edema. Electronically Signed   By: Richardean Sale M.D.   On: 09/14/2021 12:33   DG Abd Portable 1V  Result Date: 09/14/2021 CLINICAL DATA:  Code stroke earlier today. Intubation and orogastric tube placement. EXAM: PORTABLE ABDOMEN - 1 VIEW COMPARISON:  Chest radiographs same date. FINDINGS: 1212 hours. Tip of the enteric tube projects over the L1 vertebral body, consistent with position in the distal stomach. The visualized bowel gas pattern is nonobstructive. There are degenerative changes in the spine associated with a mild convex left lumbar scoliosis. Left lower lobe airspace disease noted, as described on chest radiographs. IMPRESSION: Enteric tube positioned over the distal stomach. Electronically Signed    By: Richardean Sale M.D.   On:  09/14/2021 12:34   CT HEAD CODE STROKE WO CONTRAST  Result Date: 09/14/2021 CLINICAL DATA:  Code stroke.  Aphasia EXAM: CT HEAD WITHOUT CONTRAST TECHNIQUE: Contiguous axial images were obtained from the base of the skull through the vertex without intravenous contrast. COMPARISON:  04/22/2004 FINDINGS: Brain: Chronic appearing infarctsat the bilateral basal ganglia and left cerebellum since prior. No hemorrhage or definite acute infarct. No hydrocephalus, masslike finding, or collection. Vascular: No hyperdense vessel or unexpected calcification. Skull: Normal. Negative for fracture or focal lesion. Sinuses/Orbits: No acute finding. Other: These results were communicated to Dr Theda Sers at 9:20 am on 09/14/2021 by text page via the [facility] messaging system. ASPECTS [facility] Stroke Program Early CT Score) - Ganglionic level infarction (caudate, lentiform nuclei, internal capsule, insula, M1-M3 cortex): 7 - Supraganglionic infarction (M4-M6 cortex): 3 Total score (0-10 with 10 being normal): 10 Motion degraded.  Cortical infarcts could be obscured. IMPRESSION: 1. No acute finding, although motion affect sensitivity. 2. Bilateral basal ganglia and left cerebellar infarcts since 2005 comparison. 3. C1-2 subluxation with upper cord impingement, also seen in 2005. Suggest cervical MRI when able Electronically Signed   By: Jorje Guild M.D.   On: 09/14/2021 09:23   CT ANGIO HEAD NECK W WO CM W PERF (CODE STROKE)  Result Date: 09/14/2021 CLINICAL DATA:  Aphasia EXAM: CT ANGIOGRAPHY HEAD AND NECK CT PERFUSION BRAIN TECHNIQUE: Multidetector CT imaging of the head and neck was performed using the standard protocol during bolus administration of intravenous contrast. Multiplanar CT image reconstructions and MIPs were obtained to evaluate the vascular anatomy. Carotid stenosis measurements (when applicable) are obtained utilizing NASCET criteria, using the distal internal carotid diameter  as the denominator. Multiphase CT imaging of the brain was performed following IV bolus contrast injection. Subsequent parametric perfusion maps were calculated using RAPID software. CONTRAST:  Dose is not known on this in progress study COMPARISON:  Head CT from earlier the same day FINDINGS: CTA NECK FINDINGS Aortic arch: Atheromatous plaque with 2 vessel branching. Right carotid system: Under filled common carotid compared to the left. There is bulky calcified plaque at the bifurcation with occlusion of the ICA based on the arterial phase. Left carotid system: Calcified plaque at the origin and bifurcation. Proximal ICA stenosis measures 40%. Vertebral arteries: Proximal subclavian atherosclerosis. No flow limiting subclavian stenosis. Bulky calcified plaque at the left vertebral origin. No flow seen in the left vertebral artery. Mild narrowing at the right vertebral origin where there is adjacent subclavian plaque. The right vertebral artery is patent to the basilar. Skeleton: Generalized cervical spine degeneration. Diffuse idiopathic skeletal hyperostosis with bulky ventral spurring. Advanced C1-2 facet degeneration with atlantal dental widening and adjacent ligamentous thickening causing cord compression. The Landau dental interval is nearly 8 mm. Other neck: No acute finding Upper chest: Small volume layering right pleural effusion. Review of the MIP images confirms the above findings CTA HEAD FINDINGS Anterior circulation: Right ICA occlusion with faint reconstitution beginning at the ophthalmic segment. Aplastic right A1 segment and diminutive right posterior communicating artery. Downstream vessels are difficult to assess due to underfilling. Mild left M1 segment stenosis. No noted left-sided or anterior cerebral embolism. Posterior circulation: Reconstitution of the distal left vertebral artery serving the PICA. Moderate atheromatous narrowing at the right V4 segment. Advanced mid basilar stenosis.  Hypoplastic P1 segments. Venous sinuses: Unremarkable on this study. Anatomic variants: As above CT Brain Perfusion Findings: ASPECTS: 10, but degraded by motion CBF (<30%) Volume: 20mL Perfusion (Tmax>6.0s) volume: 1100mL Mismatch Volume: 138mL Infarction  Location:Right temporoparietal cortex and deep white matter. Page placed to Dr. Theda Sers before signing. The patient is currently being transported to Interventional. IMPRESSION: 1. Emergent large vessel occlusion at the proximal right ICA with reconstituted but under filled right cavernous carotid. Circle-of-Willis variant with essentially isolated right M1 segment and a fetal type right PCA. CT perfusion suggests core infarct of 29 cc and 168 cc of penumbra affecting the right MCA and PCA territories. 2. Tenuous flow in the posterior circulation. The left vertebral artery is occluded with minimal retrograde flow in the left V4 reaching the PICA. The right vertebral artery is highly stenotic at the V4 segment. High-grade mid basilar stenosis. 3. Atlantodental instability with cord compression. Electronically Signed   By: Jorje Guild M.D.   On: 09/14/2021 09:44    Labs:  CBC: Recent Labs    08/19/21 0054 08/19/21 1148 08/30/21 1725 09/14/21 0909 09/14/21 0915 09/14/21 1159 09/15/21 0429  WBC 7.6  --  8.0 10.5  --   --  9.2  HGB 8.6*   < > 10.0* 10.8* 12.6 9.9* 8.7*  HCT 28.3*   < > 32.4* 36.6 37.0 29.0* 28.1*  PLT 135*  --  204 167  --   --  145*   < > = values in this interval not displayed.    COAGS: Recent Labs    08/16/21 1026 09/14/21 0909  INR 1.0 1.1  APTT  --  30    BMP: Recent Labs    08/19/21 0054 08/30/21 1725 09/14/21 0909 09/14/21 0915 09/14/21 1159 09/15/21 0429  NA 134* 137 139 139 137 137  K 3.6 3.8 5.7* 5.6* 6.0* 3.0*  CL 98 101 106 108  --  102  CO2 27 26 18*  --   --  26  GLUCOSE 165* 119* 249* 242*  --  105*  BUN 18 19 48* 50*  --  19  CALCIUM 8.1* 8.4* 9.4  --   --  8.1*  CREATININE 4.03* 3.34*  6.62* 6.80*  --  2.73*  GFRNONAA 12* 14* 6*  --   --  18*    LIVER FUNCTION TESTS: Recent Labs    08/18/21 0108 08/19/21 0054 09/14/21 0909 09/15/21 0429  BILITOT 0.7 0.8 1.1 0.7  AST 22 19 21  38  ALT 13 12 15 19   ALKPHOS 62 56 100 81  PROT 5.7* 5.4* 7.0 5.5*  ALBUMIN 2.7* 2.6* 3.3* 2.5*    Assessment and Plan:  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 She is receiving ASA 325 daily - anticoagulation regimen per neurology. MRI imaging today shows resolution of the previously noted occlusion of the right ICA. No further plans per IR.   Please call IR with any questions.   Electronically Signed: Soyla Dryer, AGACNP-BC [phone] 09/15/2021, 3:25 PM   I spent a total of 15 Minutes at the the patient's bedside AND on the patient's hospital floor or unit, greater than 50% of which was counseling/coordinating care for stroke intervention.

## 2021-09-15 NOTE — Progress Notes (Signed)
Pt transported to and from CT w/o event 

## 2021-09-15 NOTE — Progress Notes (Signed)
OT Cancellation Note  Patient Details Name: Jenna Brown MRN: [id number] DOB: [birth year]   Cancelled Treatment:    Reason Eval/Treat Not Completed: Patient not medically ready  Billey Chang, OTR/L  Acute Rehabilitation Services Pager: [phone] Office: [phone] .  09/15/2021, 10:42 AM

## 2021-09-16 ENCOUNTER — Encounter (HOSPITAL_COMMUNITY): Payer: Self-pay | Admitting: Radiology

## 2021-09-16 DIAGNOSIS — I639 Cerebral infarction, unspecified: Secondary | ICD-10-CM | POA: Diagnosis not present

## 2021-09-16 DIAGNOSIS — I959 Hypotension, unspecified: Secondary | ICD-10-CM

## 2021-09-16 DIAGNOSIS — I6340 Cerebral infarction due to embolism of unspecified cerebral artery: Secondary | ICD-10-CM

## 2021-09-16 LAB — BASIC METABOLIC PANEL
Anion gap: 10 (ref 5–15)
BUN: 22 mg/dL (ref 8–23)
CO2: 22 mmol/L (ref 22–32)
Calcium: 8 mg/dL — ABNORMAL LOW (ref 8.9–10.3)
Chloride: 101 mmol/L (ref 98–111)
Creatinine, Ser: 3.83 mg/dL — ABNORMAL HIGH (ref 0.44–1.00)
GFR, Estimated: 12 mL/min — ABNORMAL LOW (ref >60)
Glucose, Bld: 116 mg/dL — ABNORMAL HIGH (ref 70–99)
Potassium: 4.9 mmol/L (ref 3.5–5.1)
Sodium: 133 mmol/L — ABNORMAL LOW (ref 135–145)

## 2021-09-16 LAB — CBC
HCT: 29.9 % — ABNORMAL LOW (ref 36.0–46.0)
Hemoglobin: 9 g/dL — ABNORMAL LOW (ref 12.0–15.0)
MCH: 29.3 pg (ref 26.0–34.0)
MCHC: 30.1 g/dL (ref 30.0–36.0)
MCV: 97.4 fL (ref 80.0–100.0)
Platelets: 129 10*3/uL — ABNORMAL LOW (ref 150–400)
RBC: 3.07 MIL/uL — ABNORMAL LOW (ref 3.87–5.11)
RDW: 14.4 % (ref 11.5–15.5)
WBC: 8.8 10*3/uL (ref 4.0–10.5)
nRBC: 0 % (ref 0.0–0.2)

## 2021-09-16 LAB — GLUCOSE, CAPILLARY
Glucose-Capillary: 102 mg/dL — ABNORMAL HIGH (ref 70–99)
Glucose-Capillary: 119 mg/dL — ABNORMAL HIGH (ref 70–99)
Glucose-Capillary: 131 mg/dL — ABNORMAL HIGH (ref 70–99)
Glucose-Capillary: 135 mg/dL — ABNORMAL HIGH (ref 70–99)
Glucose-Capillary: 137 mg/dL — ABNORMAL HIGH (ref 70–99)
Glucose-Capillary: 161 mg/dL — ABNORMAL HIGH (ref 70–99)
Glucose-Capillary: 203 mg/dL — ABNORMAL HIGH (ref 70–99)

## 2021-09-16 LAB — HEPATITIS B SURFACE ANTIBODY, QUANTITATIVE: Hep B S AB Quant (Post): <3.1 m[IU]/mL — ABNORMAL LOW (ref >9.9)

## 2021-09-16 LAB — PHOSPHORUS: Phosphorus: 3.4 mg/dL (ref 2.5–4.6)

## 2021-09-16 MED ORDER — VITAL 1.5 CAL PO LIQD
1000.0000 mL | ORAL | Status: DC
  Administered 2021-09-16 – 2021-09-20 (×4): 1000 mL
  Filled 2021-09-16: qty 1000

## 2021-09-16 MED ORDER — DEXMEDETOMIDINE HCL IN NACL 400 MCG/100ML IV SOLN
0.0000 ug/kg/h | INTRAVENOUS | Status: AC
  Administered 2021-09-16 – 2021-09-18 (×3): 0.2 ug/kg/h via INTRAVENOUS
  Administered 2021-09-19: 0.5 ug/kg/h via INTRAVENOUS
  Filled 2021-09-16 (×3): qty 100

## 2021-09-16 MED ORDER — PROSOURCE TF PO LIQD
45.0000 mL | Freq: Two times a day (BID) | ORAL | Status: DC
  Administered 2021-09-16 – 2021-09-28 (×24): 45 mL
  Filled 2021-09-16 (×24): qty 45

## 2021-09-16 NOTE — Progress Notes (Signed)
PT Cancellation Note  Patient Details Name: Jenna Brown MRN: [id number] DOB: 1951/07/30   Cancelled Treatment:    Reason Eval/Treat Not Completed: Patient not medically ready - remains intubated, sedated, on bedrest. PT to continue to follow to assess for medical readiness for eval.  Stacie Glaze, PT DPT Acute Rehabilitation Services Pager [phone]  Office [phone]    [place] E [name] 09/16/2021, 11:25 AM

## 2021-09-16 NOTE — Progress Notes (Signed)
[facility] Progress Note  Subjective: pt seen in ICU, no the vent  Vitals:   09/16/21 1028 09/16/21 1030 09/16/21 1045 09/16/21 1055  BP: (!) 77/63 (!) 96/57 99/60 113/66  Pulse: 78 76 73 78  Resp: '16 12 13 13  ' Temp:      TempSrc:      SpO2: 100% 100% 100% 100%  Weight:      Height:        Exam: Gen on vent, sedated Sclera anicteric, throat w/ ETT No jvd or bruits Chest clear anterior/ lateral RRR no RG Abd soft ntnd no mass or ascites +bs MS no joint effusions or deformity Ext no LE or UE edema, R AKA Neuro is on vent, nonfocal, awake  RUA AVG +bruit      Home meds include - asa, lipitor, coreg 3.125 bid, hydralazine tid, insulin glargine, imdur, sl ntg, percocet, seroquel, renvela 2 ac tid, brilinta, vits/ supps        OP HD: [facility] MWF    3h  70kg  F200 2/2.5 bath  350/600   Hep 1000 then 500u/hr  - aranesp 35 ug q wk  - venofer 62m weekly   - hectorol 2.5 ug IV tiw  - last HD 12/23      CXR 12/27 - IMPRESSION:  New perihilar and left lower lobe airspace opacity suspicious for aspiration or edema.     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 Pt underwent mechanical thrombectomy of R ICA today.  VDRF - on vent , sedated Pulm infiltrates - R upper lung and bibasilar by CXR. Per CCM.  ESRD - on HD MWF. HD tomorrow.  H/o CM - LVEF 20-25% last echo HTN - uncontrolled initially, wnl now Volume - no vol excess on exam. Up 1-2kg by wts today. Goal UF 1-2 L as tol w/ HD tomorrow. Initial CXR findings more c/w pna/ aspiration than edema.  Anemia ckd - Hb 10-12 here, follow, cont darbe 40 ug weekly while here MBD ckd - phos low, hold any binders; Ca okay, cont vdra tiw     Rob Britain Saber 09/16/2021, 11:24 AM   Recent Labs  Lab 09/15/21 0429 09/16/21 0410  K 3.0* 4.9  BUN 19 22  CREATININE 2.73* 3.83*  CALCIUM 8.1* 8.0*  PHOS 1.5* 3.4  HGB 8.7* 9.0*    Inpatient medications:  aspirin  325 mg Per Tube Daily    Or   aspirin  300 mg Rectal Daily   atorvastatin  80 mg Per Tube Daily   chlorhexidine gluconate (MEDLINE KIT)  15 mL Mouth Rinse BID   Chlorhexidine Gluconate Cloth  6 each Topical Q0600   darbepoetin (ARANESP) injection - DIALYSIS  40 mcg Intravenous Q Wed-HD   ezetimibe  10 mg Per Tube Daily   insulin aspart  0-15 Units Subcutaneous Q4H   mouth rinse  15 mL Mouth Rinse 10 times per day   pantoprazole sodium  40 mg Per Tube QHS   polyethylene glycol  17 g Per Tube Daily   sevelamer carbonate  1,600 mg Per Tube TID WC    sodium chloride     sodium chloride     sodium chloride Stopped (09/14/21 1609)   dexmedetomidine (PRECEDEX) IV infusion     norepinephrine (LEVOPHED) Adult infusion 2 mcg/min (09/16/21 1035)   sodium chloride, sodium chloride, acetaminophen **OR** acetaminophen (TYLENOL) oral liquid 160 mg/5 mL **OR** acetaminophen, alteplase, fentaNYL (SUBLIMAZE) injection, fentaNYL (SUBLIMAZE) injection, heparin,  lidocaine (PF), lidocaine-prilocaine, ondansetron (ZOFRAN) IV, pentafluoroprop-tetrafluoroeth, senna-docusate

## 2021-09-16 NOTE — TOC CAGE-AID Note (Signed)
Transition of Care [facility]) - CAGE-AID Screening   Patient Details  Name: Jenna Brown MRN: [id number] Date of Birth: 1950-11-10  [facility]) CM/SW Contact:    Cassidy Tabet C Tarpley-Carter, [place] Phone Number: 09/16/2021, 12:19 PM   Clinical Narrative: Pt is unable to participate in Cage Aid.   Pt is intubated.  Kemond Amorin Tarpley-Carter, MSW, LCSW-A Pronouns:  She/Her/Hers [place] Transitions of Care Clinical Social Worker Direct Number:  [phone] [email or web address]    CAGE-AID Screening: Substance Abuse Screening unable to be completed due to: : Patient unable to participate             Substance Abuse Education Offered: No

## 2021-09-16 NOTE — Progress Notes (Addendum)
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  Patient able to move RUE, RN states that the patient was able to move her left UE against gravity last night.   Allergies: Plavix [clopidogrel bisulfate], Codeine, Hydrocodone, Losartan, and Tylenol with codeine #3 [acetaminophen-codeine]  Medications: Prior to Admission medications   Medication Sig Start Date End Date Taking? Authorizing Provider  acetaminophen (TYLENOL) 500 MG tablet Take 500 mg by mouth at bedtime.   Yes [provider]  aspirin 81 MG chewable tablet Chew 81 mg by mouth daily.   Yes [provider]  atorvastatin (LIPITOR) 80 MG tablet Take 80 mg by mouth daily.  01/09/18  Yes [provider]  cholecalciferol (VITAMIN D) 25 MCG (1000 UT) tablet Take 1,000 Units by mouth daily.   Yes [provider]  docusate sodium (COLACE) 100 MG capsule Take 100 mg by mouth daily as needed for mild constipation.   Yes [provider]  hydrALAZINE (APRESOLINE) 10 MG tablet Take 1 tablet by mouth three times daily on non-dialysis days 08/19/21  Yes Vann, Jessica U, DO  Insulin Glargine (BASAGLAR KWIKPEN) 100 UNIT/ML Inject 20 Units into the skin 2 (two) times daily. Taking 22 units at bedtime   Yes [provider]  isosorbide mononitrate (IMDUR) 30 MG 24 hr tablet Take 1/2 tablet (15 mg total) by mouth daily. 08/20/21  Yes Vann, Jessica U, DO  nitroGLYCERIN (NITROSTAT) 0.4 MG SL tablet DISSOLVE 1 TABLET BY MOUTH UNDER THE TONGUE EVERY 5 MINUTES FOR 3 DOSES AS NEEDED FOR CHEST PAIN Patient taking differently: 0.4 mg every 5 (five) minutes as needed for chest pain. 04/28/21  Yes Skeet Latch, MD   oxyCODONE-acetaminophen (PERCOCET/ROXICET) 5-325 MG tablet Take 1 tablet by mouth every 8 (eight) hours as needed for severe pain. 08/30/21  Yes Davonna Belling, MD  QUEtiapine (SEROQUEL) 50 MG tablet Take 50 mg by mouth 2 (two) times daily. 08/27/21  Yes [provider]  RENVELA 800 MG tablet Take 1,600 mg by mouth 3 (three) times daily with meals.   Yes [provider]  zinc sulfate 220 (50 Zn) MG capsule Take 1 capsule (220 mg total) by mouth daily. 08/20/21  Yes Geradine Girt, DO  carvedilol (COREG) 3.125 MG tablet Take 1 tablet (3.125 mg total) by mouth 2 (two) times daily with a meal. 08/19/21   Geradine Girt, DO  ticagrelor (BRILINTA) 90 MG TABS tablet Take 1 tablet (90 mg total) by mouth 2 (two) times daily. 07/13/20   [name], MD     Vital Signs: BP 101/60    Pulse (!) 111    Temp (P) 99.9 F (37.7 C) (Axillary)    Resp (!) 21    Ht 5\' 4"  (1.626 m)    Wt (P) 157 lb 13.6 oz (71.6 kg)    SpO2 100%    BMI (P) 27.09 kg/m   Physical Exam Vitals reviewed.  Constitutional:      General: She is not in acute distress.    Appearance: She is not ill-appearing.     Comments: Intubated/sedated  HENT:     Head: Normocephalic and atraumatic.  Cardiovascular:     Comments: DP dopplerable bilaterally per RN  Pulmonary:     Comments:  Intubated  Skin:    General: Skin is warm and dry.     Comments: Positive dressing on L CFA puncture site. Site is unremarkable with no erythema, edema, tenderness, bleeding or drainage. Dressing is clean, dry, and intact.    Neurological:     Mental Status: She is alert.     Comments: Arousable to verbal stimuli  Nystagmus on left, eyes does not cross from right to left  Patient is in wrist restrains, able to move R UE.  Minimla movement noted on L UE during assessment    Imaging: CT HEAD WO CONTRAST  Result Date: 09/15/2021 CLINICAL DATA:  Stroke follow-up EXAM: CT HEAD WITHOUT CONTRAST TECHNIQUE: Contiguous axial images  were obtained from the base of the skull through the vertex without intravenous contrast. COMPARISON:  None. FINDINGS: Brain: Mild hyperattenuation within the posterior right MCA territory and right PCA territory. No midline shift or other mass effect. There is periventricular hypoattenuation compatible with chronic microvascular disease. Old left cerebellar infarct. Small amount of subarachnoid blood over the posterior right hemisphere Vascular: Atherosclerotic calcification of the internal carotid arteries at the skull base. No abnormal hyperdensity of the major intracranial arteries or dural venous sinuses. Skull: The visualized skull base, calvarium and extracranial soft tissues are normal. Sinuses/Orbits: No fluid levels or advanced mucosal thickening of the visualized paranasal sinuses. No mastoid or middle ear effusion. The orbits are normal. IMPRESSION: 1. Mild hyperattenuation within the posterior right MCA territory and right PCA territory, likely contrast material. 2. Small amount of subarachnoid hyperdensity over the posterior right hemisphere is favored to be extravasated contrast rather than subarachnoid blood. 3. Old left cerebellar infarct. Electronically Signed   By: Ulyses Jarred M.D.   On: 09/15/2021 00:26   MR ANGIO HEAD WO CONTRAST  Result Date: 09/15/2021 CLINICAL DATA:  Stroke, follow-up EXAM: MRI HEAD WITHOUT CONTRAST MRA HEAD WITHOUT CONTRAST TECHNIQUE: Multiplanar, multi-echo pulse sequences of the brain and surrounding structures were acquired without intravenous contrast. Angiographic images of the Circle of Willis were acquired using MRA technique without intravenous contrast. COMPARISON:  No prior MRI, correlation is made with CT head 09/15/2021 and CT head and CTA head and neck 09/14/2021. FINDINGS: MRI HEAD FINDINGS Brain: Increased signal on diffusion-weighted imaging with ADC correlate in the right MCA and PCA territory, particularly in the right temporal lobe, anterior to  posterior, right hippocampus and medial temporal lobe, right occipital lobe, inferior right parietal lobe, right thalamus, and right basal ganglia, consistent with the known right MCA and PCA territory infarct; these areas also demonstrate increased T2 hyperintense signal and gyral swelling, consistent with edema. Increased signal on susceptibility weighted imaging is seen most prominently in the right temporal lobe (series 9, image 36), as well as smaller areas in the right occipital lobe (series 9, image 48), right parietal lobe (series 9, image 51), and right basal ganglia (series 9, image 49). Additional punctate foci of restricted diffusion with ADC correlates in the left frontal (series 3, image 39), left parietal (series 3, image 38), left occipital (series 3, images 19 and 20), right frontal (series 3, image 33 and 34), and more superior right parietal lobe (series 3, image 32). Mild local mass effect in the right temporal lobe without significant midline shift. T2 hyperintense signal in the periventricular white matter, likely the sequela of chronic small vessel ischemic disease. Remote left cerebellar and bilateral basal ganglia infarcts. Vascular: Please see MRA findings below. Skull and upper cervical spine: Normal marrow signal. Redemonstrated advanced  C1-C2 degenerative changes, which cause severe spinal canal stenosis. Sinuses/Orbits: Mucosal thickening in the right-greater-than-left maxillary sinus and left sphenoid sinus. Air-fluid level in the right sphenoid sinus. Mucosal thickening in the ethmoid air cells. The orbits are unremarkable. Other: Fluid in the left-greater-than-right mastoid air cells. MRA HEAD FINDINGS Anterior circulation: Both internal carotid arteries are patent to the termini, with patency of the right ICA new compared to the prior CTA. Moderate focal stenosis in the proximal right cavernous carotid, secondary to calcified plaques. Left A1 patent. Aplastic right A1. Normal  anterior communicating artery. Moderate to severe narrowing in the left A2 (series 4, image 119). Anterior cerebral arteries are otherwise patent to their distal aspects. Mild left M1 stenosis. No right M1 stenosis or occlusion. Normal MCA bifurcations. Distal MCA branches perfused and symmetric. Posterior circulation: Proximal left V4 is not visualized, with reconstitution after the takeoff of the left PICA. The right V4 is patent. Basilar patent to its distal aspect, although there is mild-to-moderate narrowing after the takeoff of the AICAs. Patent right superior cerebellar artery, originating from the basilar. The left superior cerebellar artery is significantly narrowed at its origin and is poorly visualized, and is likely partially supplied by the left posterior communicating artery and left P1. The left proximal P1 is hypoplastic, with a suspected common origin with the left SCA. The majority of flow is supplied by the left posterior communicating artery. Mild narrowing in the proximal left P2. Left PCA is otherwise patent. The right PCA originates from the basilar artery and a somewhat diminutive right posterior communicating artery. The distal right PCA segments are patent. Anatomic variants: None significant IMPRESSION: MRI FINDINGS: 1. Acute infarcts in the right MCA and PCA territory, most prominent in the right temporal lobe and right occipital lobe, with involvement of the right inferior parietal lobe, right thalamus and right basal ganglia. These areas also demonstrate gyral swelling and susceptibility, concerning for hemorrhage, most prominent in the right temporal lobe. No mass effect or significant midline shift. 2. Additional punctate foci of restricted diffusion in the bilateral frontal and parietal lobes, as well as the left occipital lobe, likely embolic. MRA FINDINGS: 1. Resolution of previously noted occlusion of the right ICA, status post thrombectomy. Moderate focal stenosis in the proximal  right cavernous carotid, secondary to calcification. 2. Moderate to severe narrowing in the left A2. 3. Poor visualization of the left V4; reconstitution after the takeoff of the left PICA may be retrograde. 4. Significant narrowing of the origin of the left superior cerebellar artery, which is poorly visualized. This is likely partially supplied by the left posterior communicating artery and left P1. Electronically Signed   By: Merilyn Baba M.D.   On: 09/15/2021 13:25   MR BRAIN WO CONTRAST  Result Date: 09/15/2021 CLINICAL DATA:  Stroke, follow-up EXAM: MRI HEAD WITHOUT CONTRAST MRA HEAD WITHOUT CONTRAST TECHNIQUE: Multiplanar, multi-echo pulse sequences of the brain and surrounding structures were acquired without intravenous contrast. Angiographic images of the Circle of Willis were acquired using MRA technique without intravenous contrast. COMPARISON:  No prior MRI, correlation is made with CT head 09/15/2021 and CT head and CTA head and neck 09/14/2021. FINDINGS: MRI HEAD FINDINGS Brain: Increased signal on diffusion-weighted imaging with ADC correlate in the right MCA and PCA territory, particularly in the right temporal lobe, anterior to posterior, right hippocampus and medial temporal lobe, right occipital lobe, inferior right parietal lobe, right thalamus, and right basal ganglia, consistent with the known right MCA and PCA territory infarct;  these areas also demonstrate increased T2 hyperintense signal and gyral swelling, consistent with edema. Increased signal on susceptibility weighted imaging is seen most prominently in the right temporal lobe (series 9, image 36), as well as smaller areas in the right occipital lobe (series 9, image 48), right parietal lobe (series 9, image 51), and right basal ganglia (series 9, image 49). Additional punctate foci of restricted diffusion with ADC correlates in the left frontal (series 3, image 39), left parietal (series 3, image 38), left occipital (series 3,  images 19 and 20), right frontal (series 3, image 33 and 34), and more superior right parietal lobe (series 3, image 32). Mild local mass effect in the right temporal lobe without significant midline shift. T2 hyperintense signal in the periventricular white matter, likely the sequela of chronic small vessel ischemic disease. Remote left cerebellar and bilateral basal ganglia infarcts. Vascular: Please see MRA findings below. Skull and upper cervical spine: Normal marrow signal. Redemonstrated advanced C1-C2 degenerative changes, which cause severe spinal canal stenosis. Sinuses/Orbits: Mucosal thickening in the right-greater-than-left maxillary sinus and left sphenoid sinus. Air-fluid level in the right sphenoid sinus. Mucosal thickening in the ethmoid air cells. The orbits are unremarkable. Other: Fluid in the left-greater-than-right mastoid air cells. MRA HEAD FINDINGS Anterior circulation: Both internal carotid arteries are patent to the termini, with patency of the right ICA new compared to the prior CTA. Moderate focal stenosis in the proximal right cavernous carotid, secondary to calcified plaques. Left A1 patent. Aplastic right A1. Normal anterior communicating artery. Moderate to severe narrowing in the left A2 (series 4, image 119). Anterior cerebral arteries are otherwise patent to their distal aspects. Mild left M1 stenosis. No right M1 stenosis or occlusion. Normal MCA bifurcations. Distal MCA branches perfused and symmetric. Posterior circulation: Proximal left V4 is not visualized, with reconstitution after the takeoff of the left PICA. The right V4 is patent. Basilar patent to its distal aspect, although there is mild-to-moderate narrowing after the takeoff of the AICAs. Patent right superior cerebellar artery, originating from the basilar. The left superior cerebellar artery is significantly narrowed at its origin and is poorly visualized, and is likely partially supplied by the left posterior  communicating artery and left P1. The left proximal P1 is hypoplastic, with a suspected common origin with the left SCA. The majority of flow is supplied by the left posterior communicating artery. Mild narrowing in the proximal left P2. Left PCA is otherwise patent. The right PCA originates from the basilar artery and a somewhat diminutive right posterior communicating artery. The distal right PCA segments are patent. Anatomic variants: None significant IMPRESSION: MRI FINDINGS: 1. Acute infarcts in the right MCA and PCA territory, most prominent in the right temporal lobe and right occipital lobe, with involvement of the right inferior parietal lobe, right thalamus and right basal ganglia. These areas also demonstrate gyral swelling and susceptibility, concerning for hemorrhage, most prominent in the right temporal lobe. No mass effect or significant midline shift. 2. Additional punctate foci of restricted diffusion in the bilateral frontal and parietal lobes, as well as the left occipital lobe, likely embolic. MRA FINDINGS: 1. Resolution of previously noted occlusion of the right ICA, status post thrombectomy. Moderate focal stenosis in the proximal right cavernous carotid, secondary to calcification. 2. Moderate to severe narrowing in the left A2. 3. Poor visualization of the left V4; reconstitution after the takeoff of the left PICA may be retrograde. 4. Significant narrowing of the origin of the left superior cerebellar artery, which  is poorly visualized. This is likely partially supplied by the left posterior communicating artery and left P1. Electronically Signed   By: Merilyn Baba M.D.   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 No intravenous contrast was  administered. COMPARISON:  MRI head 09/07/2021, correlation is also made with CTA head neck 09/14/2021. FINDINGS: Alignment: Physiologic. Vertebrae: No acute fracture or suspicious osseous lesion. Extensive, diffuse idiopathic skeletal hyperostosis, with bulky ventral spurs, extending from the inferior endplate of C2 to the superior endplate of C7. 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 Significant widening of the atlantodental interval, measuring up to 8 mm and best seen on the same-day MRI of the head, likely secondary to ligamentous hypertrophy. Additional ossification and thickening of the posterior longitudinal ligament narrows the spinal canal at C1-C2. Congenitally short pedicles, which narrow the AP diameter of the spinal canal. Cord: Normal cord signal. Multifocal cord flattening at C1-C2, C3-C4, C4-C5, and C5-C6. Otherwise normal morphology. Posterior Fossa, vertebral arteries, paraspinal tissues: Evidence of remote infarcts in the pons and left cerebellum. Poor visualization of the left vertebral artery. Disc levels: C1-C2: Incompletely evaluated in the axial plane given the field of view, which extends to just below C1; the field-of-view of the MRI brain extends to just above C1. On sagittal images, there is severe spinal canal stenosis, with cord flattening and compression, but without cord signal abnormality. C2-C3: Mild disc bulge. Facet and uncovertebral hypertrophy, with additional ossification medial to the left facets. Mild spinal canal stenosis. Moderate to severe left and mild-to-moderate right neural foraminal narrowing. C3-C4: Disc height loss and moderate disc bulge, which indents and deforms the ventral cord. Facet and uncovertebral hypertrophy. Severe spinal canal stenosis with cord flattening. Mild bilateral neural foraminal narrowing. C4-C5: Broad-based disc bulge, which indents the ventral cord. Severe spinal  canal stenosis with cord flattening. Uncovertebral and facet hypertrophy. Mild bilateral neural foraminal narrowing. C5-C6: Disc bulge which indents the ventral cord and causes cord flattening. Uncovertebral and facet arthropathy. Mild bilateral neural foraminal narrowing. C6-C7: Disc bulge. Facet and uncovertebral hypertrophy. Moderate spinal canal stenosis. Mild-to-moderate bilateral neural foraminal narrowing. C7-T1: No spinal canal stenosis or neural foraminal narrowing. IMPRESSION: 1. C1-C2 severe spinal canal stenosis, with cord flattening and compression, without cord signal abnormality. This is secondary to ligamentous thickening, which widens the atlantodental interval, with additional ossification thickening of the posterior longitudinal ligament. 2. C3-C4, C4-C5, and C5-C6 severe spinal canal stenosis with cord flattening. No cord signal abnormality is seen at these levels. 3. C2-C3 mild spinal canal stenosis with moderate to severe left and mild-to-moderate right neural foraminal narrowing. 4. C6-C7 moderate spinal canal stenosis and mild-to-moderate bilateral neural foraminal narrowing. 5. Electronically Signed   By: Merilyn Baba M.D.   On: 09/15/2021 14:20   DG Chest Portable 1 View  Result Date: 09/14/2021 CLINICAL DATA:  Code stroke earlier today. Intubation and orogastric tube placement. EXAM: PORTABLE CHEST 1 VIEW COMPARISON:  Radiographs 08/17/2021 and 08/16/2021. FINDINGS: 1217 hours. Interval intubation. Tip of the endotracheal tube is approximately 2.7 cm above the carina. Orogastric tube projects below the diaphragm, tip not visualized. There are lower lung volumes with increased perihilar left lower lobe airspace opacities bilaterally. No pneumothorax or significant pleural effusion identified. The heart size  and mediastinal contours are stable. IMPRESSION: 1. Satisfactory position of the endotracheal and enteric tubes. 2. New perihilar and left lower lobe airspace opacity suspicious  for aspiration or edema. Electronically Signed   By: Richardean Sale M.D.   On: 09/14/2021 12:33   DG Abd Portable 1V  Result Date: 09/14/2021 CLINICAL DATA:  Code stroke earlier today. Intubation and orogastric tube placement. EXAM: PORTABLE ABDOMEN - 1 VIEW COMPARISON:  Chest radiographs same date. FINDINGS: 1212 hours. Tip of the enteric tube projects over the L1 vertebral body, consistent with position in the distal stomach. The visualized bowel gas pattern is nonobstructive. There are degenerative changes in the spine associated with a mild convex left lumbar scoliosis. Left lower lobe airspace disease noted, as described on chest radiographs. IMPRESSION: Enteric tube positioned over the distal stomach. Electronically Signed   By: Richardean Sale M.D.   On: 09/14/2021 12:34   CT HEAD CODE STROKE WO CONTRAST  Result Date: 09/14/2021 CLINICAL DATA:  Code stroke.  Aphasia EXAM: CT HEAD WITHOUT CONTRAST TECHNIQUE: Contiguous axial images were obtained from the base of the skull through the vertex without intravenous contrast. COMPARISON:  04/22/2004 FINDINGS: Brain: Chronic appearing infarctsat the bilateral basal ganglia and left cerebellum since prior. No hemorrhage or definite acute infarct. No hydrocephalus, masslike finding, or collection. Vascular: No hyperdense vessel or unexpected calcification. Skull: Normal. Negative for fracture or focal lesion. Sinuses/Orbits: No acute finding. Other: These results were communicated to Dr Theda Sers at 9:20 am on 09/14/2021 by text page via the [facility] messaging system. ASPECTS [facility] Stroke Program Early CT Score) - Ganglionic level infarction (caudate, lentiform nuclei, internal capsule, insula, M1-M3 cortex): 7 - Supraganglionic infarction (M4-M6 cortex): 3 Total score (0-10 with 10 being normal): 10 Motion degraded.  Cortical infarcts could be obscured. IMPRESSION: 1. No acute finding, although motion affect sensitivity. 2. Bilateral basal ganglia and left  cerebellar infarcts since 2005 comparison. 3. C1-2 subluxation with upper cord impingement, also seen in 2005. Suggest cervical MRI when able Electronically Signed   By: Jorje Guild M.D.   On: 09/14/2021 09:23   CT ANGIO HEAD NECK W WO CM W PERF (CODE STROKE)  Result Date: 09/14/2021 CLINICAL DATA:  Aphasia EXAM: CT ANGIOGRAPHY HEAD AND NECK CT PERFUSION BRAIN TECHNIQUE: Multidetector CT imaging of the head and neck was performed using the standard protocol during bolus administration of intravenous contrast. Multiplanar CT image reconstructions and MIPs were obtained to evaluate the vascular anatomy. Carotid stenosis measurements (when applicable) are obtained utilizing NASCET criteria, using the distal internal carotid diameter as the denominator. Multiphase CT imaging of the brain was performed following IV bolus contrast injection. Subsequent parametric perfusion maps were calculated using RAPID software. CONTRAST:  Dose is not known on this in progress study COMPARISON:  Head CT from earlier the same day FINDINGS: CTA NECK FINDINGS Aortic arch: Atheromatous plaque with 2 vessel branching. Right carotid system: Under filled common carotid compared to the left. There is bulky calcified plaque at the bifurcation with occlusion of the ICA based on the arterial phase. Left carotid system: Calcified plaque at the origin and bifurcation. Proximal ICA stenosis measures 40%. Vertebral arteries: Proximal subclavian atherosclerosis. No flow limiting subclavian stenosis. Bulky calcified plaque at the left vertebral origin. No flow seen in the left vertebral artery. Mild narrowing at the right vertebral origin where there is adjacent subclavian plaque. The right vertebral artery is patent to the basilar. Skeleton: Generalized cervical spine degeneration. Diffuse idiopathic skeletal hyperostosis with bulky ventral spurring.  Advanced C1-2 facet degeneration with atlantal dental widening and adjacent ligamentous  thickening causing cord compression. The Landau dental interval is nearly 8 mm. Other neck: No acute finding Upper chest: Small volume layering right pleural effusion. Review of the MIP images confirms the above findings CTA HEAD FINDINGS Anterior circulation: Right ICA occlusion with faint reconstitution beginning at the ophthalmic segment. Aplastic right A1 segment and diminutive right posterior communicating artery. Downstream vessels are difficult to assess due to underfilling. Mild left M1 segment stenosis. No noted left-sided or anterior cerebral embolism. Posterior circulation: Reconstitution of the distal left vertebral artery serving the PICA. Moderate atheromatous narrowing at the right V4 segment. Advanced mid basilar stenosis. Hypoplastic P1 segments. Venous sinuses: Unremarkable on this study. Anatomic variants: As above CT Brain Perfusion Findings: ASPECTS: 10, but degraded by motion CBF (<30%) Volume: 26mL Perfusion (Tmax>6.0s) volume: 126mL Mismatch Volume: 19mL Infarction Location:Right temporoparietal cortex and deep white matter. Page placed to Dr. Theda Sers before signing. The patient is currently being transported to Interventional. IMPRESSION: 1. Emergent large vessel occlusion at the proximal right ICA with reconstituted but under filled right cavernous carotid. Circle-[place] variant with essentially isolated right M1 segment and a fetal type right PCA. CT perfusion suggests core infarct of 29 cc and 168 cc of penumbra affecting the right MCA and PCA territories. 2. Tenuous flow in the posterior circulation. The left vertebral artery is occluded with minimal retrograde flow in the left V4 reaching the PICA. The right vertebral artery is highly stenotic at the V4 segment. High-grade mid basilar stenosis. 3. Atlantodental instability with cord compression. Electronically Signed   By: Jorje Guild M.D.   On: 09/14/2021 09:44    Labs:  CBC: Recent Labs    08/19/21 0054 08/19/21 1148  08/30/21 1725 09/14/21 0909 09/14/21 0915 09/14/21 1159 09/15/21 0429  WBC 7.6  --  8.0 10.5  --   --  9.2  HGB 8.6*   < > 10.0* 10.8* 12.6 9.9* 8.7*  HCT 28.3*   < > 32.4* 36.6 37.0 29.0* 28.1*  PLT 135*  --  204 167  --   --  145*   < > = values in this interval not displayed.    COAGS: Recent Labs    08/16/21 1026 09/14/21 0909  INR 1.0 1.1  APTT  --  30    BMP: Recent Labs    08/19/21 0054 08/30/21 1725 09/14/21 0909 09/14/21 0915 09/14/21 1159 09/15/21 0429  NA 134* 137 139 139 137 137  K 3.6 3.8 5.7* 5.6* 6.0* 3.0*  CL 98 101 106 108  --  102  CO2 27 26 18*  --   --  26  GLUCOSE 165* 119* 249* 242*  --  105*  BUN 18 19 48* 50*  --  19  CALCIUM 8.1* 8.4* 9.4  --   --  8.1*  CREATININE 4.03* 3.34* 6.62* 6.80*  --  2.73*  GFRNONAA 12* 14* 6*  --   --  18*    LIVER FUNCTION TESTS: Recent Labs    08/18/21 0108 08/19/21 0054 09/14/21 0909 09/15/21 0429  BILITOT 0.7 0.8 1.1 0.7  AST 22 19 21  38  ALT 13 12 15 19   ALKPHOS 62 56 100 81  PROT 5.7* 5.4* 7.0 5.5*  ALBUMIN 2.7* 2.6* 3.3* 2.5*    Assessment and Plan:  Right ICA cervical-intracranial occlusion s/p diagnostic cerebral angiogram and mechanical thrombectomy by Dr. Karenann Cai on 12/27   Patient remains intubated/sedated. She is  receiving ASA 325 daily - anticoagulation regimen per neurology.  MRI yesterday showed resolution of the previously noted occlusion of the right ICA.   Further treatment plan per neurology/critical care  Please call NIR with any questions and concerns.   Electronically Signed: Soyla Dryer, AGACNP-BC [phone] 09/16/2021, 10:16 AM   I spent a total of 15 Minutes at the the patient's bedside AND on the patient's hospital floor or unit, greater than 50% of which was counseling/coordinating care for stroke intervention.

## 2021-09-16 NOTE — Progress Notes (Signed)
An USGPIV (ultrasound guided PIV) has been placed for short-term vasopressor infusion. A correctly placed ivWatch must be used when administering Vasopressors. Should this treatment be needed beyond 72 hours, central line access should be obtained.  It will be the responsibility of the bedside nurse to follow best practice to prevent extravasations.   ?

## 2021-09-16 NOTE — Anesthesia Preprocedure Evaluation (Signed)
Anesthesia Evaluation  Patient identified by MRN, date of birth, ID band Patient confused    Reviewed: Allergy & Precautions, Patient's Chart, lab work & pertinent test results, Unable to perform ROS - Chart review onlyPreop documentation limited or incomplete due to emergent nature of procedure.  History of Anesthesia Complications (+) PONV  Airway       Comment: Unable to assess 2/2 pt cooperation Dental   Unable to assess 2/2 pt cooperation:   Pulmonary neg pulmonary ROS, former smoker,    Pulmonary exam normal breath sounds clear to auscultation       Cardiovascular hypertension, Pt. on medications and Pt. on home beta blockers + CAD, + Past MI, + Peripheral Vascular Disease and +CHF  Normal cardiovascular exam Rhythm:Regular Rate:Tachycardia  TTE 2022 1. The anterolateral wall is akinetic. The mid and distal septum, entire  apex, and mid inferoseptum are akinetic. All other segments are  hypokinetic. Findings consistent with multivessel CAD. No LV thrombus on  contrast imaging. Left ventricular ejection  fraction, by estimation, is 20 to 25%. The left ventricle has severely  decreased function. The left ventricle demonstrates regional wall motion  abnormalities (see scoring diagram/findings for description).  Indeterminate diastolic filling due to E-A  fusion.  2. Right ventricular systolic function is normal. The right ventricular  size is normal. Tricuspid regurgitation signal is inadequate for assessing  PA pressure.  3. The mitral valve is degenerative. Trivial mitral valve regurgitation.  No evidence of mitral stenosis.  4. Focal calcification of the LCC noted. The aortic valve is tricuspid.  There is mild calcification of the aortic valve. Aortic valve  regurgitation is not visualized. Aortic valve sclerosis/calcification is  present, without any evidence of aortic  stenosis.  5. The inferior vena cava is  normal in size with greater than 50%  respiratory variability, suggesting right atrial pressure of 3 mmHg.   Cath 2016 Prox LAD to Mid LAD lesion, 90% stenosed. Ost Cx to Mid Cx lesion, 95% stenosed. Ost 2nd Mrg to 2nd Mrg lesion, 80% stenosed. Mid Cx lesion, 90% stenosed. Prox RCA lesion, 60% stenosed. Mid RCA lesion, 50% stenosed.    Neuro/Psych  Headaches, Anxiety CVA negative psych ROS   GI/Hepatic Neg liver ROS, hiatal hernia, GERD  ,  Endo/Other  diabetes, Type 2, Insulin Dependent  Renal/GU ESRF and DialysisRenal disease  negative genitourinary   Musculoskeletal negative musculoskeletal ROS (+)   Abdominal   Peds  Hematology negative hematology ROS (+)   Anesthesia Other Findings Acute CVA, pt awake but moaning and does not follow commands  Reproductive/Obstetrics                             Anesthesia Physical Anesthesia Plan  ASA: 4 and emergent  Anesthesia Plan: General   Post-op Pain Management:    Induction: Intravenous and Rapid sequence  PONV Risk Score and Plan: 4 or greater and Treatment may vary due to age or medical condition  Airway Management Planned: Oral ETT  Additional Equipment:   Intra-op Plan:   Post-operative Plan: Post-operative intubation/ventilation  Informed Consent: I have reviewed the patients History and Physical, chart, labs and discussed the procedure including the risks, benefits and alternatives for the proposed anesthesia with the patient or authorized representative who has indicated his/her understanding and acceptance.     Dental advisory given  Plan Discussed with: CRNA  Anesthesia Plan Comments:         Anesthesia Quick  Evaluation

## 2021-09-16 NOTE — Progress Notes (Signed)
Initial Nutrition Assessment  DOCUMENTATION CODES:   Not applicable  INTERVENTION:   Initiate tube feeding via OG tube: Vital 1.5 at 45 ml/h (1080 ml per day) Prosource TF 45 ml BID  Provides 1700 kcal, 94 gm protein, 820 ml free water daily   NUTRITION DIAGNOSIS:   Inadequate oral intake related to inability to eat as evidenced by NPO status.  GOAL:   Patient will meet greater than or equal to 90% of their needs  MONITOR:   TF tolerance  REASON FOR ASSESSMENT:   Consult Enteral/tube feeding initiation and management  ASSESSMENT:   Pt with PMH of ESRD on iHD, NSTEMI, CAD, HTN, HLD, R AKA, stroke, and DM admitted with R MCA with R ICA occlusion.   Pt discussed during ICU rounds and with RN.  Per RN pt not weaning and will plan to start TF today, received consult from MD.  Pt not alert on vent, unable to answer questions.  Outpatient dry weight: 70 kg  Pt with L hemiparesis.   12/27 s/p IR for mechanical thrombectomy  Patient is currently intubated on ventilator support MV: 7.3 L/min Temp (24hrs), Avg:99.4 F (37.4 C), Min:98.6 F (37 C), Max:101.4 F (38.6 C)  Medications reviewed and include: SSI, protonix, miralax  Precedex Levophed @ 5 mcg  Labs reviewed: Na 133  UOP: 50 ml   OG tube; tip in distal stomach   NUTRITION - FOCUSED PHYSICAL EXAM:  Flowsheet Row Most Recent Value  Orbital Region Mild depletion  Upper Arm Region No depletion  Thoracic and Lumbar Region No depletion  Buccal Region Unable to assess  Temple Region Moderate depletion  Clavicle and Acromion Bone Region No depletion  Scapular Bone Region Unable to assess  Dorsal Hand No depletion  Patellar Region Moderate depletion  [L leg]  Anterior Thigh Region Moderate depletion  [L leg]  Posterior Calf Region Moderate depletion  [L leg]  Edema (RD Assessment) None  Hair Reviewed  Eyes Unable to assess  Mouth Unable to assess  Skin Reviewed  Nails Reviewed       Diet  Order:   Diet Order             Diet NPO time specified  Diet effective now                   EDUCATION NEEDS:   Not appropriate for education at this time  Skin:  Skin Assessment: Reviewed RN Assessment  Last BM:  unknown  Height:   Ht Readings from Last 1 Encounters:  09/14/21 5\' 4"  (1.626 m)    Weight:   Wt Readings from Last 1 Encounters:  09/15/21 71.8 kg    Estimated Nutritional Needs:   Kcal:  1600-1800  Protein:  85-100 grams  Fluid:  1.2 Ld/ay  Lennix Rotundo P., RD, LDN, CNSC See AMiON for contact information

## 2021-09-16 NOTE — Progress Notes (Signed)
Pt receives out-pt HD at [facility] in [facility] on MWF. Pt arrives at 10:10 for 10:25 chair time. Will assist as needed.   Melven Sartorius Renal Navigator [phone]

## 2021-09-16 NOTE — Progress Notes (Signed)
Pt placed back on full vent support due to tachypnea and increased WOB. Pt tolerating well at this time. RN at bedside, RT will continue to monitor.

## 2021-09-16 NOTE — Progress Notes (Signed)
NAME:  Jenna Brown, MRN:  [id number], DOB:  Aug 27, 1951, LOS: 2 ADMISSION DATE:  09/14/2021, CONSULTATION DATE:  09/14/2021 REFERRING MD: Dr. Debbrah Alar, CHIEF COMPLAINT: Acute stroke s/p thrombectomy  History of Present Illness:  70 year old with ESRD on hemodialysis, coronary artery disease, peripheral vascular disease status post AKA continue with acute right ICA stroke s/p mechanical thrombectomy.  PCCM consulted for help with vent management  She had a recent admission for COVID-19 infection treated with monotherapy or as an outpatient.  She then developed chest pain, possible non-ST elevation MI  She remains critically ill.  Pertinent  Medical History    has a past medical history of Anemia, Anxiety, Arthritis, Cataract, CKD (chronic kidney disease), stage IV ([place]), Coronary artery disease (2016), Full dentures, GERD (gastroesophageal reflux disease), H/O hiatal hernia, Headache(784.0), Hemorrhoids, AKA (above knee amputation), right ([place]), Hyperlipidemia, Hypertension, Migraines, Myocardial infarction ([place]) (1990's), NSTEMI (non-ST elevated myocardial infarction) ([place]), Peripheral vascular disease ([place]), PONV (postoperative nausea and vomiting), Stroke ([place]), Type II diabetes mellitus ([place]), and Vertigo.   Significant Hospital Events: Including procedures, antibiotic start and stop dates in addition to other pertinent events   12/27 PCCM consult 12/28 CT head with small amount of subarachnoid hyperdensity in posterior right hemisphere, Contrast suspected. MRI CSP moderate to severe spinal canal stenosis MRI/A head Acute infarcts right MCA and PCA territory, most prominent in the right temporal lobe and right occipital lobe.  ECHO EF 25-30%, global hypokinesis, Grade I DD, RVSF normal  Interim History / Subjective:  Tmax 101.4  HD on 12/28, no fluid removal charted. +2.5ML admit  Objective   Blood pressure 118/62, pulse 78, temperature 99.6 F (37.6 C), temperature source  Axillary, resp. rate 10, height 5\' 4"  (1.626 m), weight 71.8 kg, SpO2 100 %.    Vent Mode: PSV;CPAP FiO2 (%):  [30 %-40 %] 30 % Set Rate:  [16 bmp] 16 bmp Vt Set:  [430 mL] 430 mL PEEP:  [5 cmH20] 5 cmH20 Pressure Support:  [15 cmH20-18 cmH20] 18 cmH20 Plateau Pressure:  [16 cmH20-19 cmH20] 16 cmH20   Intake/Output Summary (Last 24 hours) at 09/16/2021 0746 Last data filed at 09/16/2021 0700 Gross per 24 hour  Intake 389.71 ml  Output -90 ml  Net 479.71 ml   Filed Weights   09/15/21 0240 09/15/21 1415 09/15/21 1715  Weight: 74.2 kg 71.6 kg 71.8 kg   General: In bed, NAD, appears comfortable HEENT: MM pink/moist, anicteric, atraumatic Neuro: RASS 0, PERRL 20mm, GCS 11T, No movement on L, RT gaze preference CV: S1S2, ST, no m/r/g appreciated PULM:  air movement in lobes, trachea midline, chest expansion symmetric GI: soft, bsx4 active, non-tender   Extremities: warm/dry, no pretibial edema, capillary refill less than 3 seconds  Skin:  no rashes or lesions noted  Labs: NA 133 K 3.0>4.9 Creat 2.73>3,83 BUN 19>22 HGB 8.7>9 PLT 145>129   Resolved Hospital Problem list     Assessment & Plan:  Acute ischemic stroke with occlusion of proximal right internal carotid S/p mechanical thrombectomy, (TICI 3), CT head 12/28 with small amount of subarachnoid hyperdensity in posterior right hemisphere, Contrast suspected. 12/28 MRI CSP moderate to severe spinal canal stenosis MRI/A head acute infarcts right MCA and PCA territory, most prominent in the right temporal lobe and right occipital lobe.  -management of stroke and workup per neurology -ASA/Statin -No anticoagulant at this time per neuro -SBP goal 120-140 -Continue neuroprotective measures- normothermia, euglycemia, HOB greater than 30, head in neutral alignment, normocapnia, normoxia.  -If unable  to extubate today will order RD consult for TF  Acute respiratory failure with hypoxia COVID-19 Positive- asymptomatic Intubated  for mechanical thrombectomy, ABG 7.3/44/101/22 on 12/27 on 70% FiO2. COVID+ 1 month ago and on 12/27. Do not suspect active COVID infection contributing to resp failure.  -LTVV strategy with tidal volumes of 4-8 cc/kg ideal body weight -Goal plateau pressures less than 30 and driving pressures less than 15 -Wean PEEP/FiO2 for SpO2 92-98% -VAP bundle -Daily SAT and SBT. On SBT currently. High PS requirments -PAD bundle with Propofol gtt and fentanyl push, off propofol currently. Will transition to precedex if sedation required.  -RASS goal 0 to -1 -Follow intermittent CXR and ABG PRN  End-stage renal disease on hemodialysis (MWF) Hyperkalemia- Resolved Hyponatremia S/p HD 12/28. K 6.0>3.0>4.9. NA 133 No fluid removal documented  -Nephrology consulted and following  Severe ischemic cardiomyopathy HX HTN HX HLD 12/29 ECHO EF 25-30%, global hypokinesis, Grade I DD, RVSF normal. Remains off pressors. -Metoprolol 25mg  BID initiated by neurology  -ASA, statin, zetia -continue tele  DM2 -Blood Glucose goal 140-180. -SSI  GERD -PPI  Anemia of chronic illness Hx ESRD -Transfuse PRBC if HBG less than 7 -Obtain AM CBC to trend H&H -Monitor for signs of bleeding  Best Practice (right click and "Reselect all SmartList Selections" daily)   Diet/type: NPO w/ meds via tube DVT prophylaxis: SCD GI prophylaxis: PPI Lines: N/A Foley:  N/A Code Status:  full code Last date of multidisciplinary goals of care discussion [ per primary]   Critical care time: 34 minutes   [name]., MSN, APRN, AGACNP-BC [facility]  09/16/2021 , 7:46 AM  Please see [email or web address] for pager details  If no response, please call [phone] After hours, please call Elink at [phone]

## 2021-09-16 NOTE — Progress Notes (Addendum)
STROKE TEAM PROGRESS NOTE    INTERVAL HISTORY Patient is seen in her room with no family at the bedside.  Overnight, she had been transiently hypotensive but largely hemodynamically stable.  Her neurological exam is stable.  She remains intubated and has not yet been able to be weaned from the ventilator.  Vitals:   09/16/21 1130 09/16/21 1135 09/16/21 1140 09/16/21 1200  BP: 132/77 127/78 128/80 128/75  Pulse: 79 79 79 80  Resp: (!) 21 18 (!) 22 17  Temp:    98.6 F (37 C)  TempSrc:    Axillary  SpO2: 100% 100% 100% 100%  Weight:      Height:       CBC:  Recent Labs  Lab 09/14/21 0909 09/14/21 0915 09/15/21 0429 09/16/21 0410  WBC 10.5  --  9.2 8.8  NEUTROABS 8.8*  --   --   --   HGB 10.8*   < > 8.7* 9.0*  HCT 36.6   < > 28.1* 29.9*  MCV 99.2  --  97.6 97.4  PLT 167  --  145* 129*   < > = values in this interval not displayed.    Basic Metabolic Panel:  Recent Labs  Lab 09/15/21 0429 09/16/21 0410  NA 137 133*  K 3.0* 4.9  CL 102 101  CO2 26 22  GLUCOSE 105* 116*  BUN 19 22  CREATININE 2.73* 3.83*  CALCIUM 8.1* 8.0*  PHOS 1.5* 3.4    Lipid Panel:  Recent Labs  Lab 09/15/21 0429  CHOL 150  TRIG 114  HDL 53  CHOLHDL 2.8  VLDL 23  LDLCALC 74    HgbA1c:  Recent Labs  Lab 09/15/21 0429  HGBA1C 5.5    Urine Drug Screen:  Recent Labs  Lab 09/14/21 1640  LABOPIA NONE DETECTED  COCAINSCRNUR NONE DETECTED  LABBENZ NONE DETECTED  AMPHETMU NONE DETECTED  THCU NONE DETECTED  LABBARB NONE DETECTED     Alcohol Level  Recent Labs  Lab 09/14/21 1103  ETH <10     IMAGING past 24 hours MR CERVICAL SPINE WO CONTRAST  Result Date: 09/15/2021 CLINICAL DATA:  Spondyloarthropathy, cord compression on prior CT and MRI EXAM: MRI CERVICAL SPINE WITHOUT CONTRAST TECHNIQUE: Multiplanar, multisequence MR imaging of the cervical spine was performed. No intravenous contrast was administered. COMPARISON:  MRI head 09/07/2021, correlation is also made with  CTA head neck 09/14/2021. FINDINGS: Alignment: Physiologic. Vertebrae: No acute fracture or suspicious osseous lesion. Extensive, diffuse idiopathic skeletal hyperostosis, with bulky ventral spurs, extending from the inferior endplate of C2 to the superior endplate of C7. 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 Significant widening of the atlantodental interval, measuring up to 8 mm and best seen on the same-day MRI of the head, likely secondary to ligamentous hypertrophy. Additional ossification and thickening of the posterior longitudinal ligament narrows the spinal canal at C1-C2. Congenitally short pedicles, which narrow the AP diameter of the spinal canal. Cord: Normal cord signal. Multifocal cord flattening at C1-C2, C3-C4, C4-C5, and C5-C6. Otherwise normal morphology. Posterior Fossa, vertebral arteries, paraspinal tissues: Evidence of remote infarcts in the pons and left cerebellum. Poor visualization of the left vertebral artery. Disc levels: C1-C2: Incompletely evaluated in the axial plane given the field of view, which extends to just below C1; the field-of-view of the MRI brain extends to just above C1. On sagittal images, there is severe spinal canal stenosis, with cord flattening and compression,  but without cord signal abnormality. C2-C3: Mild disc bulge. Facet and uncovertebral hypertrophy, with additional ossification medial to the left facets. Mild spinal canal stenosis. Moderate to severe left and mild-to-moderate right neural foraminal narrowing. C3-C4: Disc height loss and moderate disc bulge, which indents and deforms the ventral cord. Facet and uncovertebral hypertrophy. Severe spinal canal stenosis with cord flattening. Mild bilateral neural foraminal narrowing. C4-C5: Broad-based disc bulge, which indents the ventral cord. Severe spinal canal stenosis with cord flattening. Uncovertebral and facet hypertrophy. Mild  bilateral neural foraminal narrowing. C5-C6: Disc bulge which indents the ventral cord and causes cord flattening. Uncovertebral and facet arthropathy. Mild bilateral neural foraminal narrowing. C6-C7: Disc bulge. Facet and uncovertebral hypertrophy. Moderate spinal canal stenosis. Mild-to-moderate bilateral neural foraminal narrowing. C7-T1: No spinal canal stenosis or neural foraminal narrowing. IMPRESSION: 1. C1-C2 severe spinal canal stenosis, with cord flattening and compression, without cord signal abnormality. This is secondary to ligamentous thickening, which widens the atlantodental interval, with additional ossification thickening of the posterior longitudinal ligament. 2. C3-C4, C4-C5, and C5-C6 severe spinal canal stenosis with cord flattening. No cord signal abnormality is seen at these levels. 3. C2-C3 mild spinal canal stenosis with moderate to severe left and mild-to-moderate right neural foraminal narrowing. 4. C6-C7 moderate spinal canal stenosis and mild-to-moderate bilateral neural foraminal narrowing. 5. Electronically Signed   By: Merilyn Baba M.D.   On: 09/15/2021 14:20   ECHOCARDIOGRAM COMPLETE  Result Date: 09/15/2021    ECHOCARDIOGRAM REPORT   Patient Name:   BERYLE BAGSBY Date of Exam: 09/15/2021 Medical Rec #:  [id number]     Height:       64.0 in Accession #:    [id number]    Weight:       163.6 lb Date of Birth:  08-02-1951    BSA:          1.796 m Patient Age:    70 years      BP:           123/82 mmHg Patient Gender: F             HR:           112 bpm. Exam Location:  Inpatient Procedure: 2D Echo, Cardiac Doppler, Color Doppler and Intracardiac            Opacification Agent Indications:    CHF  History:        Patient has prior history of Echocardiogram examinations. CAD,                 TIA; Risk Factors:Hypertension and Diabetes.  Sonographer:    Jyl Heinz Referring Phys: [id number] [place] Truda Staub IMPRESSIONS  1. Apical, mid-apical anterior, mid-apical anteroseptal, mid-apical  inferoseptal, mid-apical inferior, and mid-apical inferolateral akinesis. Otherwise global hypokinesis. Left ventricular ejection fraction, by estimation, is 25 to 30%. The left ventricle has severely decreased function. The left ventricle demonstrates regional wall motion abnormalities (see scoring diagram/findings for description). The left ventricular internal cavity size was mildly dilated. There is mild left ventricular hypertrophy of the basal-septal segment. Left ventricular diastolic parameters are consistent with Grade I diastolic dysfunction (impaired relaxation).  2. Right ventricular systolic function is normal. The right ventricular size is normal.  3. Left atrial size was mildly dilated.  4. The mitral valve is normal in structure. No evidence of mitral valve regurgitation. No evidence of mitral stenosis. Moderate mitral annular calcification.  5. The aortic valve is tricuspid. There is mild calcification of the aortic valve. There is  mild thickening of the aortic valve. Aortic valve regurgitation is not visualized. No aortic stenosis is present.  6. The inferior vena cava is normal in size with <50% respiratory variability, suggesting right atrial pressure of 8 mmHg. FINDINGS  Left Ventricle: Apical, mid-apical anterior, mid-apical anteroseptal, mid-apical inferoseptal, mid-apical inferior, and mid-apical inferolateral akinesis. Otherwise global hypokinesis. Left ventricular ejection fraction, by estimation, is 25 to 30%. The  left ventricle has severely decreased function. The left ventricle demonstrates regional wall motion abnormalities. The left ventricular internal cavity size was mildly dilated. There is mild left ventricular hypertrophy of the basal-septal segment. Left ventricular diastolic parameters are consistent with Grade I diastolic dysfunction (impaired relaxation). Indeterminate filling pressures. Right Ventricle: The right ventricular size is normal. No increase in right ventricular  wall thickness. Right ventricular systolic function is normal. Left Atrium: Left atrial size was mildly dilated. Right Atrium: Right atrial size was normal in size. Pericardium: There is no evidence of pericardial effusion. Mitral Valve: The mitral valve is normal in structure. Moderate mitral annular calcification. No evidence of mitral valve regurgitation. No evidence of mitral valve stenosis. Tricuspid Valve: The tricuspid valve is normal in structure. Tricuspid valve regurgitation is not demonstrated. No evidence of tricuspid stenosis. Aortic Valve: The aortic valve is tricuspid. There is mild calcification of the aortic valve. There is mild thickening of the aortic valve. Aortic valve regurgitation is not visualized. No aortic stenosis is present. Aortic valve peak gradient measures 4.0 mmHg. Pulmonic Valve: The pulmonic valve was normal in structure. Pulmonic valve regurgitation is trivial. No evidence of pulmonic stenosis. Aorta: The aortic root is normal in size and structure. Venous: The inferior vena cava is normal in size with less than 50% respiratory variability, suggesting right atrial pressure of 8 mmHg. IAS/Shunts: No atrial level shunt detected by color flow Doppler.  LEFT VENTRICLE PLAX 2D LVIDd:         5.20 cm      Diastology LVIDs:         4.50 cm      LV e' medial:    8.27 cm/s LV PW:         1.00 cm      LV E/e' medial:  9.9 LV IVS:        1.10 cm      LV e' lateral:   10.20 cm/s LVOT diam:     1.80 cm      LV E/e' lateral: 8.0 LV SV:         25 LV SV Index:   14 LVOT Area:     2.54 cm  LV Volumes (MOD) LV vol d, MOD A2C: 170.0 ml LV vol d, MOD A4C: 143.0 ml LV vol s, MOD A2C: 127.0 ml LV vol s, MOD A4C: 105.0 ml LV SV MOD A2C:     43.0 ml LV SV MOD A4C:     143.0 ml LV SV MOD BP:      44.6 ml RIGHT VENTRICLE            IVC RV Basal diam:  3.10 cm    IVC diam: 1.40 cm RV Mid diam:    2.50 cm RV S prime:     9.32 cm/s TAPSE (M-mode): 1.7 cm LEFT ATRIUM             Index        RIGHT ATRIUM           Index LA diam:  3.80 cm 2.12 cm/m   RA Area:     8.15 cm LA Vol (A2C):   54.7 ml 30.45 ml/m  RA Volume:   16.40 ml 9.13 ml/m LA Vol (A4C):   38.2 ml 21.27 ml/m LA Biplane Vol: 45.8 ml 25.50 ml/m  AORTIC VALVE AV Area (Vmax): 1.87 cm AV Vmax:        99.90 cm/s AV Peak Grad:   4.0 mmHg LVOT Vmax:      73.30 cm/s LVOT Vmean:     50.700 cm/s LVOT VTI:       0.099 m  AORTA Ao Root diam: 3.10 cm Ao Asc diam:  2.80 cm MITRAL VALVE MV Area (PHT): 6.51 cm     SHUNTS MV Decel Time: 117 msec     Systemic VTI:  0.10 m MV E velocity: 82.10 cm/s   Systemic Diam: 1.80 cm MV A velocity: 103.00 cm/s MV E/A ratio:  0.80 Skeet Latch MD Electronically signed by Skeet Latch MD Signature Date/Time: 09/15/2021/8:18:14 PM    Final     PHYSICAL EXAM General: Well-developed, well-nourished patient with right AKA, intubated but in no acute distress  Neurological:  PERRL, does not blink to threat on left.  Right gaze deviation.  Cough and gag reflexes present.  Able to move RUE and RLE in response to commands.  5/5 strength in RUE and RLE, 0/5 strength in LUE,not moving LLE.  ASSESSMENT/PLAN Ms. CAMBRYN CHARTERS is a 70 y.o. female with history of ESRD on IHD, NSTEMI, CAD, HTN, HLD, right AKA, stroke and T2DM presenting with left sided weakness, left facial droop and slurred speech.  She was brought to the ED.  TNK was not given as she presented outside the window.  CT angiography revealed LVO in the proximal right ICA with perfusion study showing core infarct of 29cc and penumbra of 168cc in right MCA and PCA territories. Mechanical thrombectomy was performed with TICI 3 flow achieved.  She has remained hemodynamically stable after her procedure and remains intubated at this time,.   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 Bilateral basal ganglia and left cerbellar chronic infarcts,  chronic C1-C2 subluxation. ASPECTS 10.    CTA head & neck emergent LVO at proximal right ICA, isolated right M1 segment and fetal type right PCA, left vertebral artery occlusion, stenotic right vertebral artery, high grade mid basilar stenosis CT perfusion core infarct of 29cc and penumbra of 168cc in right MCA and PCA territories Post IR CT mild hyperattenuation in right posterior MCA territory and right PCA territory, likely contrast material, small subarachnoid hyperdensity over posterior right hemisphere, old left cerebellar infarct MRI  acute infarcts in right MCA and PCA territory, punctate foci of restricted diffusion in bilateral forntal and parietal lobes as well as left occipital lobe MRA  resolution of right ICA occlusion, poor visulazation of left V4, moderate to severe narrowing of left A2, narrowing of origin of left superior cerebellar artery 2D Echo EF 96-22%, grade 1 diastolic dysfunction, no atrial level shunt LDL 74 HgbA1c 5.5 VTE prophylaxis - SCDs    Diet   Diet NPO time specified   aspirin 81 mg daily and Brilinta (ticagrelor) 90 mg bid prior to admission, now on aspirin 325 mg daily.  Therapy recommendations:  pending Disposition:  pending  Hypertension Home meds:  coreg 3.125 mg BID Stable- was hypotensive but  stable off vasopressors now SBP 120-140 Long-term BP goal normotensive  Hyperlipidemia Home meds:  atorvastatin 80 mg daily, resumed in hospital LDL 74, goal < 70 Add zetia 10 mg daily  Continue statin at discharge  Respiratory failure Patient remains intubated after thrombectomy Will attempt to wean and hopefully extubate tomorrow Management per CCM  Other Stroke Risk Factors Advanced Age >/= 40  Former cigarette smoker Hx stroke Coronary artery disease  Other Active Problems ESRD on IHD Appreciate nephrology recommendations IHD yesterday  Hospital day # [place] , MSN, AGACNP-BC [facility] See Amion for  schedule and pager information 09/16/2021 12:47 PM   ATTENDING NOTE: I reviewed above note and agree with the assessment and plan. Pt was seen and examined.   No family at bedside.  Patient still intubated, on vent with weaning, however frequent lack of effort on triggering vent, needed frequent stimulation to keep vent running, did not pass weaning trial, back to ventilation mode.  Patient opens eyes on voice, follows commands on right but still left hemiplegia.  MRI showed large right MCA and PCA infarct with confluent petechial hemorrhage, on aspirin.  Was put on metoprolol yesterday for tachycardia, heart rate controlled however BP on the low end, DC metoprolol and put on Levophed.  Continue tube feeding and statin and Zetia.  Appreciate CCM assistance.  For detailed assessment and plan, please refer to above as I have made changes wherever appropriate.   This patient is critically ill due to large right MCA and PCA stroke, right ICA occlusion status post thrombectomy, cardiomyopathy with low EF, respiratory failure and at significant risk of neurological worsening, death form recurrent stroke, hemorrhagic conversion, heart failure, cardiac arrest, respiratory failure. This patient's care requires constant monitoring of vital signs, hemodynamics, respiratory and cardiac monitoring, review of multiple databases, neurological assessment, discussion with family, other specialists and medical decision making of high complexity. I spent 35 minutes of neurocritical care time in the care of this patient.    Rosalin Hawking, MD PhD Stroke Neurology 09/16/2021 7:59 PM   To contact Stroke Continuity provider, please refer to [email or web address] After hours, contact General Neurology

## 2021-09-16 NOTE — Progress Notes (Signed)
OT Cancellation Note  Patient Details Name: Jenna Brown MRN: [id number] DOB: 1951-03-23   Cancelled Treatment:    Reason Eval/Treat Not Completed: Patient not medically ready.  Remains intubated, sedated, on bedrest. OT to continue to follow to assess for medical readiness for eval.    Daneli Butkiewicz D Maryam Feely 09/16/2021, 12:20 PM

## 2021-09-16 NOTE — Anesthesia Postprocedure Evaluation (Signed)
Anesthesia Post Note  Patient: Jenna Brown  Procedure(s) Performed: IR WITH ANESTHESIA     Patient location during evaluation: ICU Anesthesia Type: General Level of consciousness: patient remains intubated per anesthesia plan and sedated Pain management: pain level controlled Vital Signs Assessment: post-procedure vital signs reviewed and stable Respiratory status: patient remains intubated per anesthesia plan Cardiovascular status: blood pressure returned to baseline and stable Postop Assessment: no apparent nausea or vomiting Anesthetic complications: no   No notable events documented.  Last Vitals:  Vitals:   09/16/21 0700 09/16/21 0744  BP: 118/62   Pulse: 76 78  Resp: 16 10  Temp:    SpO2: 100% 100%    Last Pain:  Vitals:   09/16/21 0403  TempSrc: Axillary                 Lavalle Skoda L Gerard Bonus

## 2021-09-17 ENCOUNTER — Inpatient Hospital Stay (HOSPITAL_COMMUNITY): Payer: Medicare Other

## 2021-09-17 DIAGNOSIS — I639 Cerebral infarction, unspecified: Secondary | ICD-10-CM | POA: Diagnosis not present

## 2021-09-17 DIAGNOSIS — R739 Hyperglycemia, unspecified: Secondary | ICD-10-CM

## 2021-09-17 LAB — BASIC METABOLIC PANEL
Anion gap: 12 (ref 5–15)
BUN: 43 mg/dL — ABNORMAL HIGH (ref 8–23)
CO2: 24 mmol/L (ref 22–32)
Calcium: 8.1 mg/dL — ABNORMAL LOW (ref 8.9–10.3)
Chloride: 99 mmol/L (ref 98–111)
Creatinine, Ser: 5.44 mg/dL — ABNORMAL HIGH (ref 0.44–1.00)
GFR, Estimated: 8 mL/min — ABNORMAL LOW (ref >60)
Glucose, Bld: 269 mg/dL — ABNORMAL HIGH (ref 70–99)
Potassium: 4.5 mmol/L (ref 3.5–5.1)
Sodium: 135 mmol/L (ref 135–145)

## 2021-09-17 LAB — CBC
HCT: 28.7 % — ABNORMAL LOW (ref 36.0–46.0)
Hemoglobin: 8.8 g/dL — ABNORMAL LOW (ref 12.0–15.0)
MCH: 29.7 pg (ref 26.0–34.0)
MCHC: 30.7 g/dL (ref 30.0–36.0)
MCV: 97 fL (ref 80.0–100.0)
Platelets: 149 10*3/uL — ABNORMAL LOW (ref 150–400)
RBC: 2.96 MIL/uL — ABNORMAL LOW (ref 3.87–5.11)
RDW: 14.5 % (ref 11.5–15.5)
WBC: 12.8 10*3/uL — ABNORMAL HIGH (ref 4.0–10.5)
nRBC: 0 % (ref 0.0–0.2)

## 2021-09-17 LAB — GLUCOSE, CAPILLARY
Glucose-Capillary: 233 mg/dL — ABNORMAL HIGH (ref 70–99)
Glucose-Capillary: 257 mg/dL — ABNORMAL HIGH (ref 70–99)
Glucose-Capillary: 297 mg/dL — ABNORMAL HIGH (ref 70–99)
Glucose-Capillary: 242 mg/dL — ABNORMAL HIGH (ref 70–99)
Glucose-Capillary: 222 mg/dL — ABNORMAL HIGH (ref 70–99)
Glucose-Capillary: 272 mg/dL — ABNORMAL HIGH (ref 70–99)

## 2021-09-17 LAB — PHOSPHORUS
Phosphorus: 4.1 mg/dL (ref 2.5–4.6)
Phosphorus: 4.2 mg/dL (ref 2.5–4.6)

## 2021-09-17 LAB — MAGNESIUM
Magnesium: 2.1 mg/dL (ref 1.7–2.4)
Magnesium: 2.1 mg/dL (ref 1.7–2.4)

## 2021-09-17 MED ORDER — FENTANYL CITRATE PF 50 MCG/ML IJ SOSY
12.5000 ug | PREFILLED_SYRINGE | INTRAMUSCULAR | Status: DC | PRN
Start: 2021-09-17 — End: 2021-09-18
  Administered 2021-09-17: 12.5 ug via INTRAVENOUS
  Administered 2021-09-18: 25 ug via INTRAVENOUS
  Filled 2021-09-17 (×2): qty 1

## 2021-09-17 MED ORDER — HEPARIN SODIUM (PORCINE) 5000 UNIT/ML IJ SOLN
5000.0000 [IU] | Freq: Three times a day (TID) | INTRAMUSCULAR | Status: DC
  Administered 2021-09-17 – 2021-09-27 (×32): 5000 [IU] via SUBCUTANEOUS
  Filled 2021-09-17 (×32): qty 1

## 2021-09-17 MED ORDER — INSULIN GLARGINE-YFGN 100 UNIT/ML ~~LOC~~ SOLN
10.0000 [IU] | Freq: Two times a day (BID) | SUBCUTANEOUS | Status: DC
Start: 2021-09-17 — End: 2021-09-20
  Administered 2021-09-17 – 2021-09-20 (×7): 10 [IU] via SUBCUTANEOUS
  Filled 2021-09-17 (×8): qty 0.1

## 2021-09-17 NOTE — Progress Notes (Signed)
PT Cancellation Note  Patient Details Name: Jenna Brown MRN: [id number] DOB: [birth year]   Cancelled Treatment:    Reason Eval/Treat Not Completed: Medical issues which prohibited therapy;Patient not medically ready - pt with desaturations and hypotension this am, bedrest remains in place. Per rehab protocol, PT to sign off at this time due to x3 cancels in a row. Please reconsult PT when medically appropriate.  Stacie Glaze, PT DPT Acute Rehabilitation Services Pager [phone]  Office [phone]    [name] 09/17/2021, 9:13 AM

## 2021-09-17 NOTE — Progress Notes (Signed)
OT Cancellation Note  Patient Details Name: VALINCIA TOUCH MRN: [id number] DOB: 12-25-1950   Cancelled Treatment:    Reason Eval/Treat Not Completed: Patient not medically ready.  Medical issues which prohibited therapy;Patient not medically ready - pt with desaturations and hypotension this am, bedrest remains in place.  Continue efforts as appropriate.    Kellyann Ordway D Weyman Bogdon 09/17/2021, 10:13 AM

## 2021-09-17 NOTE — Progress Notes (Signed)
STROKE TEAM PROGRESS NOTE   INTERVAL HISTORY RN is at the bedside. Pt had eventful morning. She became agitated this morning on pressure support, RR 50s on vent, given fentanyl but then became hypotensive BP 50s and saturation went to 30s.  Put back on Levophed and now stabilized, BP100s and saturation 100%.  Her left arm and leg weakness improved, left lower extremity 3/5, left upper extremity 3-/5 bicep.  Follows commands, mildly drowsy sleepy.  Will have hemodialysis today, not a candidate to extubate today.  Vitals:   09/17/21 1000 09/17/21 1100 09/17/21 1130 09/17/21 1200  BP: 123/61 (!) 109/56  (!) 118/55  Pulse: 81 72  91  Resp: 16 16  17   Temp:      TempSrc:      SpO2: 100% 98% 99% 99%  Weight:      Height:       CBC:  Recent Labs  Lab 09/14/21 0909 09/14/21 0915 09/16/21 0410 09/17/21 0614  WBC 10.5   < > 8.8 12.8*  NEUTROABS 8.8*  --   --   --   HGB 10.8*   < > 9.0* 8.8*  HCT 36.6   < > 29.9* 28.7*  MCV 99.2   < > 97.4 97.0  PLT 167   < > 129* 149*   < > = values in this interval not displayed.   Basic Metabolic Panel:  Recent Labs  Lab 09/16/21 0410 09/17/21 0614  NA 133* 135  K 4.9 4.5  CL 101 99  CO2 22 24  GLUCOSE 116* 269*  BUN 22 43*  CREATININE 3.83* 5.44*  CALCIUM 8.0* 8.1*  MG  --  2.1  PHOS 3.4 4.1   Lipid Panel:  Recent Labs  Lab 09/15/21 0429  CHOL 150  TRIG 114  HDL 53  CHOLHDL 2.8  VLDL 23  LDLCALC 74   HgbA1c:  Recent Labs  Lab 09/15/21 0429  HGBA1C 5.5   Urine Drug Screen:  Recent Labs  Lab 09/14/21 1640  LABOPIA NONE DETECTED  COCAINSCRNUR NONE DETECTED  LABBENZ NONE DETECTED  AMPHETMU NONE DETECTED  THCU NONE DETECTED  LABBARB NONE DETECTED    Alcohol Level  Recent Labs  Lab 09/14/21 1103  ETH <10    IMAGING past 24 hours No results found.  PHYSICAL EXAM  Temp:  [99.6 F (37.6 C)-100.7 F (38.2 C)] 99.6 F (37.6 C) (12/30 0800) Pulse Rate:  [68-105] 91 (12/30 1200) Resp:  [14-22] 17 (12/30  1200) BP: (80-137)/(50-80) 118/55 (12/30 1200) SpO2:  [95 %-100 %] 99 % (12/30 1200) FiO2 (%):  [30 %-100 %] 40 % (12/30 1130)  General - Well nourished, well developed, intubated off Precedex.  Ophthalmologic - fundi not visualized due to noncooperation.  Cardiovascular - Regular rate and rhythm.  Neuro - intubated off Precedex still on Levophed pressor, sleepy but eyes open to voice,  following simple commands on the right and left. With eye opening, eyes in right gaze preference, however left gaze incomplete, blinking to visual threat on the right but not on the left, tracking bilaterally, PERRL. Corneal reflex present, gag and cough present. Breathing over the vent.  Facial symmetry not able to test due to ET tube.  Tongue protrusion not cooperative.  Right upper extremity at least 4/5, left lower extremity AKA but 4/5 LEs proximal.  Left lower extremity 3/5 proximal and left upper extremity 3 -/5 bicep, proximal 0/5. Sensation, coordination not quite cooperative and gait not tested.    ASSESSMENT/PLAN Ms. Sheng  EULA JASTER is a 70 y.o. female with history of ESRD on IHD, NSTEMI, CAD, HTN, HLD, right AKA, stroke and T2DM presenting with left sided weakness, left facial droop and slurred speech.  She was brought to the ED.  TNK was not given as she presented outside the window.  CT angiography revealed LVO in the proximal right ICA with perfusion study showing core infarct of 29cc and penumbra of 168cc in right MCA and PCA territories. Mechanical thrombectomy was performed with TICI 3 flow achieved.  She has remained hemodynamically stable after her procedure and remains intubated at this time,.   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 Bilateral basal ganglia and left cerbellar chronic infarcts, chronic C1-C2 subluxation. ASPECTS 10.    CTA head & neck emergent LVO at  proximal right ICA, isolated right M1 segment and fetal type right PCA, left vertebral artery occlusion, stenotic right vertebral artery, high grade mid basilar stenosis CT perfusion 29/197 Status post IR with TICI3 at right ICA.  MRI  acute infarcts in right MCA and PCA territory, punctate foci of restricted diffusion in bilateral forntal and parietal lobes as well as left occipital lobe MRA  resolution of right ICA occlusion  2D Echo EF 25-30%, no LV thrombus LDL 74 HgbA1c 5.5 UDS negative VTE prophylaxis -Heparin subcu aspirin 81 mg daily and Brilinta (ticagrelor) 90 mg bid prior to admission, now on aspirin 325 mg daily. No AC now given large infarct size.  May consider [facility] later given cardiomyopathy with low EF. Therapy recommendations:  pending Disposition:  pending  Respiratory failure Patient remains intubated 09/16/21 no breathing effort on weaning trial 09/17/21 tachypnea with PSV mode Not candidate for extubation today Management per CCM  Cardiomyopathy 07/2021 EF 20 to 25% This admission EF 25 to 30% Home meds including Imdur and Coreg Currently not on home meds due to hypotension Management per CCM No AC now given large infarct size.  May consider [facility] later given low EF and stroke.  Hx of hypertension Hypotension Home meds:  coreg 3.125 mg BID BP on the low end On levophed  Long-term BP goal normotensive  Hyperlipidemia Home meds:  atorvastatin 80 mg daily, resumed in hospital LDL 74, goal < 70 Add zetia 10 mg daily  Continue statin at discharge  Diabetes Hemoglobin A1c 5.5, uncontrolled Hyperglycemia On insulin SSI CBG monitoring Close PCP follow-up for better DM control  Other Stroke Risk Factors Advanced Age >/= 65  Former cigarette smoker Hx stroke Coronary artery disease PVD  Other Active Problems ESRD on IHD - Appreciate nephrology recommendations - HD today Right LE AKA Initial CT showed C1-C2 subluxation with anterior spinal cord  compression, similar as prior.  MRI C-spine showed diffuse cervical spine spinal canal stenosis with cord flattening but no cord signal abnormality. Anemia due to ESRD, hemoglobin 9.9-> 8.8 Leukocytosis WBC 12.8  Hospital day # 3  Rosalin Hawking, MD PhD Stroke Neurology 09/17/2021 12:19 PM  This patient is critically ill due to large right MCA and PCA stroke, right ICA occlusion status post thrombectomy, cardiomyopathy with low EF, ESRD on dialysis, hypertension, hyperglycemia and at significant risk of neurological worsening, death form recurrent stroke, hemorrhagic conversion, heart failure, pulm edema, renal failure, DKA. This patient's care requires constant monitoring of vital signs, hemodynamics, respiratory and cardiac monitoring, review of multiple databases, neurological assessment, discussion with family, other  specialists and medical decision making of high complexity. I spent 40 minutes of neurocritical care time in the care of this patient.  I discussed with Dr. Vaughan Browner CCM.    To contact Stroke Continuity provider, please refer to [email or web address] After hours, contact General Neurology

## 2021-09-17 NOTE — Progress Notes (Incomplete)
Attending note: I have seen and examined the patient. History, labs and imaging reviewed.  Dropped O2 sats on PSV weans Required levo overnight  Blood pressure (!) 109/56, pulse 72, temperature 99.6 F (37.6 C), temperature source Axillary, resp. rate 16, height 5\' 4"  (1.626 m), weight 71.8 kg, SpO2 98 %. Gen:      No acute distress HEENT:  EOMI, sclera anicteric Neck:     No masses; no thyromegaly Lungs:    Clear to auscultation bilaterally; normal respiratory effort*** CV:         Regular rate and rhythm; no murmurs Abd:      + bowel sounds; soft, non-tender; no palpable masses, no distension Ext:    No edema; adequate peripheral perfusion Skin:      Warm and dry; no rash Neuro: alert and oriented x 3 Psych: normal mood and affect   Labs/Imaging personally reviewed, significant for   Assessment/plan:  The patient is critically ill with multiple organ systems failure and requires high complexity decision making for assessment and support, frequent evaluation and titration of therapies, application of advanced monitoring technologies and extensive interpretation of multiple databases.  Critical care time - 35 mins. This represents my time independent of the NPs time taking care of the pt.  Marshell Garfinkel MD [facility] 09/17/2021, 11:14 AM

## 2021-09-17 NOTE — Progress Notes (Signed)
Pt placed back on full vent support due to desat, tachypnea and agitation . Pt tolerating well, RN at bedside, MD aware, RT will continue to monitor.

## 2021-09-17 NOTE — Progress Notes (Signed)
[facility] Progress Note  Subjective: pt seen in ICU, on the vent and sedated  Vitals:   09/17/21 0905 09/17/21 0910 09/17/21 1000 09/17/21 1100  BP: 105/65 126/75 123/61 (!) 109/56  Pulse: 87 82 81 72  Resp: '16 16 16 16  ' Temp:      TempSrc:      SpO2: 99% 100% 100% 98%  Weight:      Height:        Exam: Gen on vent, sedated Sclera anicteric, throat w/ ETT No jvd or bruits Chest clear anterior/ lateral RRR no RG Abd soft ntnd no mass or ascites +bs MS no joint effusions or deformity Ext no LE or UE edema, R AKA Neuro is on vent, nonfocal, awake  RUA AVG +bruit      Home meds include - asa, lipitor, coreg 3.125 bid, hydralazine tid, insulin glargine, imdur, sl ntg, percocet, seroquel, renvela 2 ac tid, brilinta, vits/ supps        OP HD: [facility] MWF    3h  70kg  F200 2/2.5 bath  350/600   Hep 1000 then 500u/hr  - aranesp 35 ug q wk  - venofer 68m weekly   - hectorol 2.5 ug IV tiw  - last HD 12/23      CXR 12/27 - IMPRESSION:  New perihilar and left lower lobe airspace opacity suspicious for aspiration or edema.     Assessment/ Plan: Acute CVA / R ICA occlusion - pt underwent mechanical thrombectomy of R ICA initially.  VDRF - on vent , sedated Pulm infiltrates - R upper lung and bibasilar by CXR. Per CCM.  ESRD - on HD MWF. HD today. HD time will be shortened due to high census/ staffing issues.  H/o CM - LVEF 20-25% last echo HTN - wnl now Volume - no vol excess on exam. Up 1-2kg by wts. Goal UF small since on pressors. Initial CXR findings more c/w pna/ aspiration than edema.  Anemia ckd - Hb 10-12 here, follow, cont darbe 40 ug weekly while here MBD ckd - phos low, hold any binders; Ca okay, cont vdra tiw     Rob Milas Schappell 09/17/2021, 11:13 AM   Recent Labs  Lab 09/16/21 0410 09/17/21 0614  K 4.9 4.5  BUN 22 43*  CREATININE 3.83* 5.44*  CALCIUM 8.0* 8.1*  PHOS 3.4 4.1  HGB 9.0* 8.8*    Inpatient medications:  aspirin  325 mg  Per Tube Daily   Or   aspirin  300 mg Rectal Daily   atorvastatin  80 mg Per Tube Daily   chlorhexidine gluconate (MEDLINE KIT)  15 mL Mouth Rinse BID   Chlorhexidine Gluconate Cloth  6 each Topical Q0600   darbepoetin (ARANESP) injection - DIALYSIS  40 mcg Intravenous Q Wed-HD   ezetimibe  10 mg Per Tube Daily   feeding supplement (PROSource TF)  45 mL Per Tube BID   insulin aspart  0-15 Units Subcutaneous Q4H   mouth rinse  15 mL Mouth Rinse 10 times per day   pantoprazole sodium  40 mg Per Tube QHS   polyethylene glycol  17 g Per Tube Daily    sodium chloride     sodium chloride     sodium chloride Stopped (09/14/21 1609)   dexmedetomidine (PRECEDEX) IV infusion Stopped (09/17/21 0858)   feeding supplement (VITAL 1.5 CAL) 1,000 mL (09/16/21 1503)   norepinephrine (LEVOPHED) Adult infusion 4 mcg/min (09/17/21 1100)   sodium chloride, sodium chloride, acetaminophen **OR** acetaminophen (TYLENOL)  oral liquid 160 mg/5 mL **OR** acetaminophen, alteplase, fentaNYL (SUBLIMAZE) injection, fentaNYL (SUBLIMAZE) injection, heparin, lidocaine (PF), lidocaine-prilocaine, ondansetron (ZOFRAN) IV, pentafluoroprop-tetrafluoroeth, senna-docusate

## 2021-09-17 NOTE — Progress Notes (Signed)
NAME:  SUSANNA BENGE, MRN:  [id number], DOB:  01/02/1951, LOS: 3 ADMISSION DATE:  09/14/2021, CONSULTATION DATE:  09/14/2021 REFERRING MD: Dr. Debbrah Alar, CHIEF COMPLAINT: Acute stroke s/p thrombectomy  History of Present Illness:  70 year old with ESRD on hemodialysis, coronary artery disease, peripheral vascular disease status post AKA continue with acute right ICA stroke s/p mechanical thrombectomy.  PCCM consulted for help with vent management  She had a recent admission for COVID-19 infection treated with monotherapy or as an outpatient.  She then developed chest pain, possible non-ST elevation MI  She remains critically ill.  Pertinent  Medical History    has a past medical history of Anemia, Anxiety, Arthritis, Cataract, CKD (chronic kidney disease), stage IV ([place]), Coronary artery disease (2016), Full dentures, GERD (gastroesophageal reflux disease), H/O hiatal hernia, Headache(784.0), Hemorrhoids, AKA (above knee amputation), right ([place]), Hyperlipidemia, Hypertension, Migraines, Myocardial infarction ([place]) (1990's), NSTEMI (non-ST elevated myocardial infarction) ([place]), Peripheral vascular disease ([place]), PONV (postoperative nausea and vomiting), Stroke ([place]), Type II diabetes mellitus ([place]), and Vertigo.   Significant Hospital Events: Including procedures, antibiotic start and stop dates in addition to other pertinent events   12/27 PCCM consult 12/28 CT head with small amount of subarachnoid hyperdensity in posterior right hemisphere, Contrast suspected. MRI CSP moderate to severe spinal canal stenosis MRI/A head Acute infarcts right MCA and PCA territory, most prominent in the right temporal lobe and right occipital lobe.  ECHO EF 25-30%, global hypokinesis, Grade I DD, RVSF normal  Interim History / Subjective:  TMAX 100.7  Hypotensive on 12/29 s/p metoprolol. Started on NE briefly. Weaned from levophed.  Became agitated on PSV. 50 fentantyl given, became hypotensive again. NE  restarted.   +3L admit 4 levophed 0.5 precedex  Unable to obtain subjective evaluation due to patient status   Objective   Blood pressure 137/67, pulse 99, temperature (!) 100.5 F (38.1 C), temperature source Axillary, resp. rate 16, height 5\' 4"  (1.626 m), weight 71.8 kg, SpO2 99 %.    Vent Mode: PRVC FiO2 (%):  [30 %] 30 % Set Rate:  [16 bmp] 16 bmp Vt Set:  [430 mL] 430 mL PEEP:  [5 cmH20] 5 cmH20 Pressure Support:  [18 cmH20] 18 cmH20 Plateau Pressure:  [18 cmH20-19 cmH20] 18 cmH20   Intake/Output Summary (Last 24 hours) at 09/17/2021 0730 Last data filed at 09/17/2021 0600 Gross per 24 hour  Intake 852.32 ml  Output 100 ml  Net 752.32 ml   Filed Weights   09/15/21 0240 09/15/21 1415 09/15/21 1715  Weight: 74.2 kg 71.6 kg 71.8 kg   General: In bed, NAD, appears comfortable HEENT: MM pink/moist, anicteric, atraumatic Neuro: RASS 0, PERRL 59mm, GCS 11t, follows commands with RUE, no movement on left CV: S1S2, NSR, no m/r/g appreciated PULM:  clear in the upper lobes, clear in the lower lobes, trachea midline, chest expansion symmetric GI: soft, bsx4 active, non-tender   Extremities: warm/dry, no pretibial edema, capillary refill less than 3 seconds, RT fistula with bruit and thrill,RT AKA  Skin:  no rashes or lesions noted  Labs: WBC 8.8> 12.8 PLT 129>149 Hgb 8.8 NA 133>135 BG 269 Creat 3.8>5.44  Resolved Hospital Problem list     Assessment & Plan:  Acute ischemic stroke with occlusion of proximal right internal carotid S/p mechanical thrombectomy, (TICI 3), CT head 12/28 with small amount of subarachnoid hyperdensity in posterior right hemisphere, Contrast suspected. 12/28 MRI CSP moderate to severe spinal canal stenosis MRI/A head acute infarcts right MCA and PCA territory,  most prominent in the right temporal lobe and right occipital lobe. -management of stroke and workup per neurology -ASA/Statin -No anticoagulant at this time per neruology -SBP goal  120-140 -Continue neuroprotective measures- normothermia, euglycemia, HOB greater than 30, head in neutral alignment, normocapnia, normoxia.   Acute respiratory failure with hypoxia COVID-19 Positive- asymptomatic Intubated for mechanical thrombectomy, ABG 7.3/44/101/22 on 12/27 on 70% FiO2. COVID+ 1 month ago and on 12/27. Do not suspect active COVID infection contributing to resp failure. Continued issues with PS trials. Patient remains tachypnic with low tidal volumes on high PS. -LTVV strategy with tidal volumes of 4-8 cc/kg ideal body weight -Goal plateau pressures less than 30 and driving pressures less than 15 -Wean PEEP/FiO2 for SpO2 92-98% -VAP bundle -Daily SAT and SBT -PAD bundle with Precedex gtt and fentanyl push. Fentanyl dose decreased -RASS goal 0 to -1 -Follow intermittent CXR and ABG PRN -Check CXR and tracheal aspirate -If WBC continues to rise will consider Abx   Hypotension  Secondary to metoprolol and fentanyl administration -continue peripheral NE. Goal MAP 60. Titrate to goal  End-stage renal disease on hemodialysis (MWF) Hyperkalemia- Resolved Hyponatremia S/p HD 12/28. K 6.0>3.0>4.9> . NA 133>   -Neph consulted and following -Follow up BMP and AM  Severe ischemic cardiomyopathy HX HTN HX HLD 12/29 ECHO EF 25-30%, global hypokinesis, Grade I DD, RVSF normal. Remains off pressors. -Hold metoprolol s/p hypotension after initiation -Continue ASA, statin, zetia -continue tele  DM2 -Blood Glucose goal 140-180. -Ssi -Adding long acting  GERD -ppi  Anemia of chronic illness Hx ESRD -Transfuse PRBC if HBG less than 7 -Obtain AM CBC to trend H&H -Monitor for signs of bleeding  Best Practice (right click and "Reselect all SmartList Selections" daily)   Diet/type: NPO w/ meds via tube DVT prophylaxis: SCD GI prophylaxis: PPI Lines: N/A Foley:  N/A Code Status:  full code Last date of multidisciplinary goals of care discussion [ per  primary]   Critical care time: 34 minutes   [name]., MSN, APRN, AGACNP-BC [facility]  09/17/2021 , 7:30 AM  Please see [email or web address] for pager details  If no response, please call [phone] After hours, please call Elink at [phone]

## 2021-09-18 DIAGNOSIS — I639 Cerebral infarction, unspecified: Secondary | ICD-10-CM | POA: Diagnosis not present

## 2021-09-18 LAB — GLUCOSE, CAPILLARY
Glucose-Capillary: 180 mg/dL — ABNORMAL HIGH (ref 70–99)
Glucose-Capillary: 240 mg/dL — ABNORMAL HIGH (ref 70–99)
Glucose-Capillary: 244 mg/dL — ABNORMAL HIGH (ref 70–99)
Glucose-Capillary: 264 mg/dL — ABNORMAL HIGH (ref 70–99)
Glucose-Capillary: 289 mg/dL — ABNORMAL HIGH (ref 70–99)
Glucose-Capillary: 318 mg/dL — ABNORMAL HIGH (ref 70–99)

## 2021-09-18 LAB — CBC
HCT: 26.2 % — ABNORMAL LOW (ref 36.0–46.0)
Hemoglobin: 7.8 g/dL — ABNORMAL LOW (ref 12.0–15.0)
MCH: 28.9 pg (ref 26.0–34.0)
MCHC: 29.8 g/dL — ABNORMAL LOW (ref 30.0–36.0)
MCV: 97 fL (ref 80.0–100.0)
Platelets: 183 10*3/uL (ref 150–400)
RBC: 2.7 MIL/uL — ABNORMAL LOW (ref 3.87–5.11)
RDW: 14.6 % (ref 11.5–15.5)
WBC: 15.7 10*3/uL — ABNORMAL HIGH (ref 4.0–10.5)
nRBC: 0 % (ref 0.0–0.2)

## 2021-09-18 LAB — PHOSPHORUS
Phosphorus: 2.4 mg/dL — ABNORMAL LOW (ref 2.5–4.6)
Phosphorus: 3 mg/dL (ref 2.5–4.6)

## 2021-09-18 LAB — BASIC METABOLIC PANEL
Anion gap: 6 (ref 5–15)
BUN: 32 mg/dL — ABNORMAL HIGH (ref 8–23)
CO2: 25 mmol/L (ref 22–32)
Calcium: 7.6 mg/dL — ABNORMAL LOW (ref 8.9–10.3)
Chloride: 103 mmol/L (ref 98–111)
Creatinine, Ser: 4.28 mg/dL — ABNORMAL HIGH (ref 0.44–1.00)
GFR, Estimated: 11 mL/min — ABNORMAL LOW (ref >60)
Glucose, Bld: 240 mg/dL — ABNORMAL HIGH (ref 70–99)
Potassium: 3.8 mmol/L (ref 3.5–5.1)
Sodium: 134 mmol/L — ABNORMAL LOW (ref 135–145)

## 2021-09-18 LAB — MAGNESIUM
Magnesium: 2.2 mg/dL (ref 1.7–2.4)
Magnesium: 2.2 mg/dL (ref 1.7–2.4)

## 2021-09-18 MED ORDER — CHLORHEXIDINE GLUCONATE CLOTH 2 % EX PADS
6.0000 | MEDICATED_PAD | Freq: Every day | CUTANEOUS | Status: DC
  Administered 2021-09-18 – 2021-09-21 (×4): 6 via TOPICAL

## 2021-09-18 MED ORDER — CEFEPIME HCL 1 G IJ SOLR
1.0000 g | INTRAMUSCULAR | Status: DC
  Administered 2021-09-18 – 2021-09-20 (×3): 1 g via INTRAVENOUS
  Filled 2021-09-18 (×4): qty 1

## 2021-09-18 MED ORDER — DARBEPOETIN ALFA 40 MCG/0.4ML IJ SOSY: 40.0000 ug | PREFILLED_SYRINGE | INTRAMUSCULAR | Status: DC

## 2021-09-18 MED ORDER — ALBUMIN HUMAN 25 % IV SOLN
25.0000 g | Freq: Once | INTRAVENOUS | Status: AC
  Administered 2021-09-18: 25 g via INTRAVENOUS
  Filled 2021-09-18: qty 100

## 2021-09-18 NOTE — Progress Notes (Signed)
NAME:  Jenna Brown, MRN:  [id number], DOB:  1950-11-01, LOS: 4 ADMISSION DATE:  09/14/2021, CONSULTATION DATE:  09/14/2021 REFERRING MD: Dr. Debbrah Alar, CHIEF COMPLAINT: Acute stroke s/p thrombectomy  History of Present Illness:  70 year old with ESRD on hemodialysis, coronary artery disease, peripheral vascular disease status post AKA continue with acute right ICA stroke s/p mechanical thrombectomy.  PCCM consulted for help with vent management  She had a recent admission for COVID-19 infection treated with monotherapy or as an outpatient.  She then developed chest pain, possible non-ST elevation MI  Pertinent  Medical History    has a past medical history of Anemia, Anxiety, Arthritis, Cataract, CKD (chronic kidney disease), stage IV ([place]), Coronary artery disease (2016), Full dentures, GERD (gastroesophageal reflux disease), H/O hiatal hernia, Headache(784.0), Hemorrhoids, AKA (above knee amputation), right ([place]), Hyperlipidemia, Hypertension, Migraines, Myocardial infarction ([place]) (1990's), NSTEMI (non-ST elevated myocardial infarction) ([place]), Peripheral vascular disease ([place]), PONV (postoperative nausea and vomiting), Stroke ([place]), Type II diabetes mellitus ([place]), and Vertigo.   Significant Hospital Events: Including procedures, antibiotic start and stop dates in addition to other pertinent events   12/27 PCCM consult 12/28 CT head with small amount of subarachnoid hyperdensity in posterior right hemisphere, Contrast suspected. MRI CSP moderate to severe spinal canal stenosis MRI/A head Acute infarcts right MCA and PCA territory, most prominent in the right temporal lobe and right occipital lobe.  ECHO EF 25-30%, global hypokinesis, Grade I DD, RVSF normal 12/31 low blood pressure while on dialysis last night.  Was briefly on pressors.  Continues to fail pressure support weans due to desats  Interim History / Subjective:   Continues on Precedex. Levophed weaned off  Objective   Blood  pressure 140/68, pulse 81, temperature 99 F (37.2 C), temperature source Axillary, resp. rate 16, height 5\' 4"  (1.626 m), weight 71.8 kg, SpO2 98 %.    Vent Mode: PRVC FiO2 (%):  [40 %] 40 % Set Rate:  [16 bmp] 16 bmp Vt Set:  [430 mL-432 mL] 430 mL PEEP:  [5 cmH20] 5 cmH20 Plateau Pressure:  [19 cmH20-21 cmH20] 20 cmH20   Intake/Output Summary (Last 24 hours) at 09/18/2021 0936 Last data filed at 09/18/2021 0900 Gross per 24 hour  Intake 1396.92 ml  Output 535 ml  Net 861.92 ml   Filed Weights   09/15/21 0240 09/15/21 1415 09/15/21 1715  Weight: 74.2 kg 71.6 kg 71.8 kg    Gen:      No acute distress HEENT:  EOMI, sclera anicteric, ETT Neck:     No masses; no thyromegaly Lungs:    Clear to auscultation bilaterally; normal respiratory effort CV:         Regular rate and rhythm; no murmurs Abd:      + bowel sounds; soft, non-tender; no palpable masses, no distension Ext:    No edema; adequate peripheral perfusion Skin:      Warm and dry; no rash Neuro: Sedated  Lab/imaging reviewed Significant for sodium 134, creatinine 4.28.  WBC count elevated to 15.7, hemoglobin 7.8 Chest x-ray noted with left effusion  Resolved Hospital Problem list     Assessment & Plan:  Acute ischemic stroke with occlusion of proximal right internal carotid S/p mechanical thrombectomy, (TICI 3), CT head 12/28 with small amount of subarachnoid hyperdensity in posterior right hemisphere, Contrast suspected. 12/28 MRI CSP moderate to severe spinal canal stenosis MRI/A head acute infarcts right MCA and PCA territory, most prominent in the right temporal lobe and right occipital lobe. -management of  stroke and workup per neurology -ASA/Statin -No anticoagulant at this time per neruology -SBP goal 120-140 -Continue neuroprotective measures- normothermia, euglycemia, HOB greater than 30, head in neutral alignment, normocapnia, normoxia.   Acute respiratory failure with hypoxia COVID-19 Positive-  asymptomatic Intubated for mechanical thrombectomy, ABG 7.3/44/101/22 on 12/27 on 70% FiO2. COVID+ 1 month ago and on 12/27. Do not suspect active COVID infection contributing to resp failure. Continued issues with PS trials. Patient remains tachypnic with low tidal volumes on high PS.  Continue vent support She has increasing leukocytosis with failure to wean and intermittently low blood pressure.  Start cefepime for possible hospital-acquired pneumonia Tracheal aspirate is pending  Intermittent hypotension Received albumin yesterday Off pressors  End-stage renal disease on hemodialysis (MWF) Hyponatremia Hemodialysis per nephrology  Severe ischemic cardiomyopathy HX HTN HX HLD 12/29 ECHO EF 25-30%, global hypokinesis, Grade I DD, RVSF normal. Remains off pressors. -Hold metoprolol s/p hypotension after initiation -Continue ASA, statin, zetia -continue tele  DM2 SSI, long-acting insulin  GERD PPI  Anemia of chronic illness Hx ESRD Follow CBC  Best Practice (right click and "Reselect all SmartList Selections" daily)   Diet/type: NPO w/ meds via tube DVT prophylaxis: SCD GI prophylaxis: PPI Lines: N/A Foley:  N/A Code Status:  full code Last date of multidisciplinary goals of care discussion [ per primary]   Critical care time:    The patient is critically ill with multiple organ system failure and requires high complexity decision making for assessment and support, frequent evaluation and titration of therapies, advanced monitoring, review of radiographic studies and interpretation of complex data.   Critical Care Time devoted to patient care services, exclusive of separately billable procedures, described in this note is 35 minutes.   Marshell Garfinkel MD  Pulmonary & Critical care See Amion for pager  If no response to pager , please call [phone] until 7pm After 7:00 pm call Elink  [phone] 09/18/2021, 9:44 AM

## 2021-09-18 NOTE — Progress Notes (Signed)
eLink Physician-Brief Progress Note Patient Name: Jenna Brown DOB: May 29, 1951 MRN: [id number]   Date of Service  09/18/2021  HPI/Events of Note  Patient dropped her blood pressure into the 70's at the onset of dialysis, requiring max dose infusion rate of Norepinephrine, her serum albumin is 2.5 gm / dl.  eICU Interventions  Albumin 25 % , 25 gm iv bolus x 1 ordered.        Kerry Kass Riti Rollyson 09/18/2021, 4:16 AM

## 2021-09-18 NOTE — Progress Notes (Signed)
Pharmacy Antibiotic Note  Jenna Brown is a 70 y.o. female admitted on 09/14/2021 with pneumonia.  Pharmacy has been consulted for Cefepime dosing.   ID: Asymptomatic COVID, Tmax 100.3, WBC up 15.7. Add abs 12/31 for potential HAP, increasing leukocytosis with failure to wean and intermittently low blood pressure.  12/27 Covid - positive (recently treated outpt) 12/27 MRSA PCR - negative 12/30 TA - few Gram variable rods  Plan: Cefepime 1g IV q24h for ESRD patient   Height: 5\' 4"  (162.6 cm) Weight: 71.8 kg (158 lb 4.6 oz) IBW/kg (Calculated) : 54.7  Temp (24hrs), Avg:99.1 F (37.3 C), Min:97.7 F (36.5 C), Max:100.3 F (37.9 C)  Recent Labs  Lab 09/14/21 0909 09/14/21 0915 09/15/21 0429 09/16/21 0410 09/17/21 0614 09/18/21 0541  WBC 10.5  --  9.2 8.8 12.8* 15.7*  CREATININE 6.62* 6.80* 2.73* 3.83* 5.44* 4.28*    Estimated Creatinine Clearance: 12.2 mL/min (A) (by C-G formula based on SCr of 4.28 mg/dL (H)).    Allergies  Allergen Reactions   Plavix [Clopidogrel Bisulfate] Itching   Codeine Other (See Comments)    "makes me feel strange"   Hydrocodone Other (See Comments)    Feels disorinated   Losartan Other (See Comments)   Tylenol With Codeine #3 [Acetaminophen-Codeine] Other (See Comments)    "doesn't make me feel right"    Barbar Brede S. Alford Highland, PharmD, BCPS Clinical Staff Pharmacist [email or web address]  Wayland Salinas 09/18/2021 9:52 AM

## 2021-09-18 NOTE — Progress Notes (Signed)
[facility] Progress Note  Subjective: pt seen in ICU, on the vent and sedated  Vitals:   09/18/21 1000 09/18/21 1055 09/18/21 1100 09/18/21 1200  BP: 124/65  (!) 114/52 (!) 112/53  Pulse: 81 82 75 74  Resp: '16 20 20 16  ' Temp:      TempSrc:      SpO2: 100%  97% 97%  Weight:      Height:        Exam: on vent ,sedated  no jvd  throat ett in place  Chest cta bilat and lat  Cor reg no RG  Abd soft ntnd no ascites   Ext no LE edema   Neuro on vent and sedated, not following commands   RUA AVG +bruit      Home meds include - asa, lipitor, coreg 3.125 bid, hydralazine tid, insulin glargine, imdur, sl ntg, percocet, seroquel, renvela 2 ac tid, brilinta, vits/ supps        OP HD: [facility] MWF    3h  70kg  F200 2/2.5 bath  350/600   Hep 1000 then 500u/hr  - aranesp 35 ug q wk  - venofer 57m weekly   - hectorol 2.5 ug IV tiw  - last HD 12/23      CXR 12/27 - IMPRESSION:  New perihilar and left lower lobe airspace opacity suspicious for aspiration or edema.     Assessment/ Plan: Acute CVA / R ICA occlusion - pt underwent mechanical thrombectomy of R ICA day of admission.  VDRF - on vent , per CCM Hypotension - remains on low dose levo gtt  Volume - no edema per CXR's, 4kg up on admit and now 1.5 kg over. Hypotension precludes further vol removal.  Pulm infiltrates/ ^temp/ ^[place]- +bilat infiltrates by initial CXR, getting IV cefepime per CCM.  F/u CXR improved 12/30.   Recent +COVID - on last admit, off precautions now.  ESRD - on HD MWF. Had HD late last night. Next HD Monday.  H/o CM - LVEF 20-25% last echo HTN - BP's low here, home BP med on hold Anemia ckd - Hb 7.5- 9 here, follow, transfuse prn. Cont darbe 40 ug SQ weekly while here MBD ckd - phos low, holding renvela; Ca okay, cont vdra tiw     Rob Fynlee Rowlands 09/18/2021, 12:29 PM   Recent Labs  Lab 09/17/21 0614 09/17/21 1613 09/18/21 0541  K 4.5  --  3.8  BUN 43*  --  32*  CREATININE 5.44*  --   4.28*  CALCIUM 8.1*  --  7.6*  PHOS 4.1 4.2 2.4*  HGB 8.8*  --  7.8*    Inpatient medications:  aspirin  325 mg Per Tube Daily   Or   aspirin  300 mg Rectal Daily   atorvastatin  80 mg Per Tube Daily   chlorhexidine gluconate (MEDLINE KIT)  15 mL Mouth Rinse BID   Chlorhexidine Gluconate Cloth  6 each Topical Q0600   darbepoetin (ARANESP) injection - DIALYSIS  40 mcg Intravenous Q Wed-HD   ezetimibe  10 mg Per Tube Daily   feeding supplement (PROSource TF)  45 mL Per Tube BID   heparin injection (subcutaneous)  5,000 Units Subcutaneous Q8H   insulin aspart  0-15 Units Subcutaneous Q4H   insulin glargine-yfgn  10 Units Subcutaneous BID   mouth rinse  15 mL Mouth Rinse 10 times per day   pantoprazole sodium  40 mg Per Tube QHS   polyethylene glycol  17  g Per Tube Daily    sodium chloride     sodium chloride     sodium chloride Stopped (09/14/21 1609)   ceFEPime (MAXIPIME) IV Stopped (09/18/21 1122)   dexmedetomidine (PRECEDEX) IV infusion 0.2 mcg/kg/hr (09/18/21 1200)   feeding supplement (VITAL 1.5 CAL) 1,000 mL (09/18/21 0916)   norepinephrine (LEVOPHED) Adult infusion 4 mcg/min (09/18/21 1216)   sodium chloride, sodium chloride, acetaminophen **OR** acetaminophen (TYLENOL) oral liquid 160 mg/5 mL **OR** acetaminophen, alteplase, fentaNYL (SUBLIMAZE) injection, heparin, lidocaine (PF), lidocaine-prilocaine, ondansetron (ZOFRAN) IV, pentafluoroprop-tetrafluoroeth, senna-docusate

## 2021-09-18 NOTE — Progress Notes (Addendum)
STROKE TEAM PROGRESS NOTE   INTERVAL HISTORY RN is at the bedside. Fentanyl prn dose given and dropped BP per nurse. Will d/c order.  Her BP gets unstable during dialysis sessions.  Had  hemodialysis earlier today, intubated. CCM started abx for elevated WBC 15.7 possible aspiration pneumonia. Unlikely to extubate today. On prededex low dose to help with agitation.  Vitals:   09/18/21 1055 09/18/21 1100 09/18/21 1200 09/18/21 1300  BP:  (!) 114/52 (!) 112/53 (!) 117/101  Pulse: 82 75 74 65  Resp: 20 20 16 15   Temp:   99.9 F (37.7 C)   TempSrc:   Axillary   SpO2:  97% 97% 98%  Weight:      Height:       CBC:  Recent Labs  Lab 09/14/21 0909 09/14/21 0915 09/17/21 0614 09/18/21 0541  WBC 10.5   < > 12.8* 15.7*  NEUTROABS 8.8*  --   --   --   HGB 10.8*   < > 8.8* 7.8*  HCT 36.6   < > 28.7* 26.2*  MCV 99.2   < > 97.0 97.0  PLT 167   < > 149* 183   < > = values in this interval not displayed.   Basic Metabolic Panel:  Recent Labs  Lab 09/17/21 0614 09/17/21 1613 09/18/21 0541  NA 135  --  134*  K 4.5  --  3.8  CL 99  --  103  CO2 24  --  25  GLUCOSE 269*  --  240*  BUN 43*  --  32*  CREATININE 5.44*  --  4.28*  CALCIUM 8.1*  --  7.6*  MG 2.1 2.1 2.2  PHOS 4.1 4.2 2.4*   Lipid Panel:  Recent Labs  Lab 09/15/21 0429  CHOL 150  TRIG 114  HDL 53  CHOLHDL 2.8  VLDL 23  LDLCALC 74   HgbA1c:  Recent Labs  Lab 09/15/21 0429  HGBA1C 5.5   Urine Drug Screen:  Recent Labs  Lab 09/14/21 1640  LABOPIA NONE DETECTED  COCAINSCRNUR NONE DETECTED  LABBENZ NONE DETECTED  AMPHETMU NONE DETECTED  THCU NONE DETECTED  LABBARB NONE DETECTED    Alcohol Level  Recent Labs  Lab 09/14/21 1103  ETH <10    IMAGING past 24 hours No results found.  PHYSICAL EXAM  Temp:  [97.7 F (36.5 C)-100.3 F (37.9 C)] 99.9 F (37.7 C) (12/31 1200) Pulse Rate:  [39-164] 65 (12/31 1300) Resp:  [13-22] 15 (12/31 1300) BP: (76-149)/(34-101) 117/101 (12/31 1300) SpO2:   [97 %-100 %] 98 % (12/31 1300) FiO2 (%):  [40 %] 40 % (12/31 1055)  General - Well nourished, well developed, intubated off Precedex.  Ophthalmologic - fundi not visualized due to noncooperation.  Cardiovascular - Regular rate and rhythm.  Neuro - intubated on Precedex low dose , sleepy but eyes open to voice,  following simple commands on the right and left. Will nod yes/not to questions.  eyes in right gaze preference, however left gaze incomplete, blinking to visual threat on the right but not on the left, tracking bilaterally, PERRL. Corneal reflex present, gag and cough present. Breathing over the vent.  Facial symmetry not able to test due to ET tube.   Motor:  Right upper extremity at least 4/5, left lower extremity AKA but 4/5 LEs proximal.  Left lower extremity 3/5 proximal and left upper extremity 3 -/5 bicep, proximal 0/5.   gait not tested.    ASSESSMENT/PLAN [name] is a 70 y.o. female with history of ESRD on IHD, NSTEMI, CAD, HTN, HLD, right AKA, stroke and T2DM presenting with left sided weakness, left facial droop and slurred speech.  She was brought to the ED.  TNK was not given as she presented outside the window.  CT angiography revealed LVO in the proximal right ICA with perfusion study showing core infarct of 29cc and penumbra of 168cc in right MCA and PCA territories. Mechanical thrombectomy was performed with TICI 3 flow achieved.  Still intubated with fluctuations in BP. 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 Bilateral basal ganglia and left cerbellar chronic infarcts, chronic C1-C2 subluxation. ASPECTS 10.    CTA head & neck emergent LVO at proximal right ICA, isolated right M1 segment and fetal type right PCA, left vertebral artery occlusion, stenotic right vertebral artery, high grade mid basilar stenosis CT perfusion 29/197 Status  post IR with TICI3 at right ICA.  MRI  acute infarcts in right MCA and PCA territory, punctate foci of restricted diffusion in bilateral forntal and parietal lobes as well as left occipital lobe MRA  resolution of right ICA occlusion  2D Echo EF 25-30%, no LV thrombus LDL 74 HgbA1c 5.5 UDS negative VTE prophylaxis -Heparin subcu aspirin 81 mg daily and Brilinta (ticagrelor) 90 mg bid prior to admission, now on aspirin 325 mg daily. No AC now given large infarct size.  May consider [facility] later given cardiomyopathy with low EF. Therapy recommendations:  pending Disposition:  pending  Respiratory failure Patient remains intubated 09/16/21 no breathing effort on weaning trial 09/17/21 tachypnea with PSV mode Not candidate for extubation today Management per CCM  Elevated WBC  WBC 15 today. Possible aspiration pneumonia.  Cefepime ordered by CCM.  Cardiomyopathy 07/2021 EF 20 to 25% This admission EF 25 to 30% Home meds including Imdur and Coreg Currently not on home meds due to hypotension Management per CCM No AC now given large infarct size.  May consider [facility] later given low EF and stroke.  Hx of hypertension Hypotension Home meds:  coreg 3.125 mg BID BP on the low end On levophed intermittently Long-term BP goal normotensive  Hyperlipidemia Home meds:  atorvastatin 80 mg daily, resumed in hospital LDL 74, goal < 70  zetia 10 mg daily and atorvastatin 80mg  Continue statin at discharge    Diabetes Hemoglobin A1c 5.5, uncontrolled Hyperglycemia On insulin SSI CBG monitoring Close PCP follow-up for better DM control Diabetic consult.  Other Stroke Risk Factors Advanced Age >/= 2  Former cigarette smoker Hx stroke Coronary artery disease PVD  Other Active Problems ESRD on IHD - Appreciate nephrology recommendations - HD today Right LE AKA Initial CT showed C1-C2 subluxation with anterior spinal cord compression, similar as prior.  MRI C-spine showed diffuse  cervical spine spinal canal stenosis with cord flattening but no cord signal abnormality. Anemia due to ESRD, hemoglobin 9.9-> 8.8 Leukocytosis WBC 15  Hospital day # 4   ATTENDING ATTESTATION:  Still with variable BP requiring prn levophed. Fentanyl stopped as it drops her BP. On Abx by CCM for elevated WBC. VSS, will continue to monitor.    This patient is critically ill due to respiratory distress, large right MCA and PCA stroke, right ICA occlusion status post thrombectomy, cardiomyopathy with low EF, ESRD on dialysis, and at significant risk of neurological worsening, death form heart failure,  respiratory failure, recurrent stroke, bleeding from [facility], seizure, sepsis. This patient's care requires constant monitoring of vital signs, hemodynamics, respiratory and cardiac monitoring, review of multiple databases, neurological assessment, discussion with family, other specialists and medical decision making of high complexity. I spent 35 minutes of neurocritical care time in the care of this patient.   Jenna Caroll,MD   I discussed with Dr. Vaughan Browner CCM.    To contact Stroke Continuity provider, please refer to [email or web address] After hours, contact General Neurology

## 2021-09-19 DIAGNOSIS — I639 Cerebral infarction, unspecified: Secondary | ICD-10-CM | POA: Diagnosis not present

## 2021-09-19 LAB — CBC
HCT: 27.1 % — ABNORMAL LOW (ref 36.0–46.0)
Hemoglobin: 8.2 g/dL — ABNORMAL LOW (ref 12.0–15.0)
MCH: 29.5 pg (ref 26.0–34.0)
MCHC: 30.3 g/dL (ref 30.0–36.0)
MCV: 97.5 fL (ref 80.0–100.0)
Platelets: 197 10*3/uL (ref 150–400)
RBC: 2.78 MIL/uL — ABNORMAL LOW (ref 3.87–5.11)
RDW: 14.5 % (ref 11.5–15.5)
WBC: 15.4 10*3/uL — ABNORMAL HIGH (ref 4.0–10.5)
nRBC: 0 % (ref 0.0–0.2)

## 2021-09-19 LAB — BASIC METABOLIC PANEL
Anion gap: 12 (ref 5–15)
BUN: 49 mg/dL — ABNORMAL HIGH (ref 8–23)
CO2: 25 mmol/L (ref 22–32)
Calcium: 8.3 mg/dL — ABNORMAL LOW (ref 8.9–10.3)
Chloride: 101 mmol/L (ref 98–111)
Creatinine, Ser: 6.14 mg/dL — ABNORMAL HIGH (ref 0.44–1.00)
GFR, Estimated: 7 mL/min — ABNORMAL LOW (ref >60)
Glucose, Bld: 290 mg/dL — ABNORMAL HIGH (ref 70–99)
Potassium: 4.8 mmol/L (ref 3.5–5.1)
Sodium: 138 mmol/L (ref 135–145)

## 2021-09-19 LAB — GLUCOSE, CAPILLARY
Glucose-Capillary: 200 mg/dL — ABNORMAL HIGH (ref 70–99)
Glucose-Capillary: 289 mg/dL — ABNORMAL HIGH (ref 70–99)
Glucose-Capillary: 273 mg/dL — ABNORMAL HIGH (ref 70–99)
Glucose-Capillary: 209 mg/dL — ABNORMAL HIGH (ref 70–99)
Glucose-Capillary: 292 mg/dL — ABNORMAL HIGH (ref 70–99)
Glucose-Capillary: 279 mg/dL — ABNORMAL HIGH (ref 70–99)

## 2021-09-19 MED ORDER — DEXMEDETOMIDINE HCL IN NACL 400 MCG/100ML IV SOLN
0.4000 ug/kg/h | INTRAVENOUS | Status: DC
  Administered 2021-09-19 – 2021-09-20 (×2): 0.3 ug/kg/h via INTRAVENOUS
  Administered 2021-09-20 – 2021-09-21 (×2): 0.5 ug/kg/h via INTRAVENOUS
  Administered 2021-09-21: 0.9 ug/kg/h via INTRAVENOUS
  Administered 2021-09-22: 0.8 ug/kg/h via INTRAVENOUS
  Administered 2021-09-22: 1.2 ug/kg/h via INTRAVENOUS
  Administered 2021-09-22: 0.7 ug/kg/h via INTRAVENOUS
  Administered 2021-09-23: 0.5 ug/kg/h via INTRAVENOUS
  Administered 2021-09-24 (×3): 1 ug/kg/h via INTRAVENOUS
  Administered 2021-09-24: 0.8 ug/kg/h via INTRAVENOUS
  Administered 2021-09-25: 0.5 ug/kg/h via INTRAVENOUS
  Administered 2021-09-25: 0.8 ug/kg/h via INTRAVENOUS
  Filled 2021-09-19 (×13): qty 100

## 2021-09-19 MED ORDER — MIDODRINE HCL 5 MG PO TABS
5.0000 mg | ORAL_TABLET | Freq: Three times a day (TID) | ORAL | Status: DC
  Administered 2021-09-19 (×2): 5 mg
  Filled 2021-09-19 (×3): qty 1

## 2021-09-19 NOTE — Progress Notes (Signed)
STROKE TEAM PROGRESS NOTE   INTERVAL HISTORY RN is at the bedside. Still intubated.  No requiring levo. VSS, will continue to monitor. CCM added midodrine to help with BP. On abx for possible pneumonia. Difficult to wean vent.   Vitals:   09/19/21 1630 09/19/21 1645 09/19/21 1700 09/19/21 1715  BP: (!) 101/46 (!) 124/44 (!) 106/47 (!) 144/69  Pulse: 61 (!) 57 62 66  Resp: (!) 22 (!) 25 (!) 25 (!) 22  Temp:      TempSrc:      SpO2: 100% 99% 99% 100%  Weight:      Height:       CBC:  Recent Labs  Lab 09/14/21 0909 09/14/21 0915 09/18/21 0541 09/19/21 0244  WBC 10.5   < > 15.7* 15.4*  NEUTROABS 8.8*  --   --   --   HGB 10.8*   < > 7.8* 8.2*  HCT 36.6   < > 26.2* 27.1*  MCV 99.2   < > 97.0 97.5  PLT 167   < > 183 197   < > = values in this interval not displayed.   Basic Metabolic Panel:  Recent Labs  Lab 09/18/21 0541 09/18/21 1802 09/19/21 0244  NA 134*  --  138  K 3.8  --  4.8  CL 103  --  101  CO2 25  --  25  GLUCOSE 240*  --  290*  BUN 32*  --  49*  CREATININE 4.28*  --  6.14*  CALCIUM 7.6*  --  8.3*  MG 2.2 2.2  --   PHOS 2.4* 3.0  --    Lipid Panel:  Recent Labs  Lab 09/15/21 0429  CHOL 150  TRIG 114  HDL 53  CHOLHDL 2.8  VLDL 23  LDLCALC 74   HgbA1c:  Recent Labs  Lab 09/15/21 0429  HGBA1C 5.5   Urine Drug Screen:  Recent Labs  Lab 09/14/21 1640  LABOPIA NONE DETECTED  COCAINSCRNUR NONE DETECTED  LABBENZ NONE DETECTED  AMPHETMU NONE DETECTED  THCU NONE DETECTED  LABBARB NONE DETECTED    Alcohol Level  Recent Labs  Lab 09/14/21 1103  ETH <10    IMAGING past 24 hours No results found.  PHYSICAL EXAM  Temp:  [98.6 F (37 C)-100.8 F (38.2 C)] 99.5 F (37.5 C) (01/01 1600) Pulse Rate:  [39-115] 66 (01/01 1715) Resp:  [0-32] 22 (01/01 1715) BP: (72-160)/(32-106) 144/69 (01/01 1715) SpO2:  [92 %-100 %] 100 % (01/01 1715) FiO2 (%):  [40 %] 40 % (01/01 1505)  General - intubated on Precedex.  Ophthalmologic - fundi  not visualized due to noncooperation.  Cardiovascular - Regular rate and rhythm.  Neuro - intubated on Precedex low dose , eyes open to voice,  following simple commands on the right and left. Will nod yes/not to questions.  eyes in right gaze preference, however left gaze incomplete, blinking to visual threat on the right but not on the left, tracking bilaterally, PERRL. Corneal reflex present, gag and cough present. Breathing over the vent.  Facial symmetry not able to test due to ET tube.   Motor:  Right upper extremity at least 4/5, left lower extremity AKA but 4/5 LEs proximal.  Left lower extremity 3/5 proximal and left upper extremity 3 -/5 bicep, proximal 0/5.   gait not tested.    ASSESSMENT/PLAN Jenna Brown is a 71 y.o. female with history of ESRD on IHD, NSTEMI, CAD, HTN, HLD, right AKA, stroke and  T2DM presenting with left sided weakness, left facial droop and slurred speech.  She was brought to the ED.  TNK was not given as she presented outside the window.  CT angiography revealed LVO in the proximal right ICA with perfusion study showing core infarct of 29cc and penumbra of 168cc in right MCA and PCA territories. Mechanical thrombectomy was performed with TICI 3 flow achieved.  Still intubated with fluctuations in BP. 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 Bilateral basal ganglia and left cerbellar chronic infarcts, chronic C1-C2 subluxation. ASPECTS 10.    CTA head & neck emergent LVO at proximal right ICA, isolated right M1 segment and fetal type right PCA, left vertebral artery occlusion, stenotic right vertebral artery, high grade mid basilar stenosis CT perfusion 29/197 Status post IR with TICI3 at right ICA.  MRI  acute infarcts in right MCA and PCA territory, punctate foci of restricted diffusion in bilateral forntal and parietal lobes as well as  left occipital lobe MRA  resolution of right ICA occlusion  2D Echo EF 25-30%, no LV thrombus LDL 74 HgbA1c 5.5 UDS negative VTE prophylaxis -Heparin subcu aspirin 81 mg daily and Brilinta (ticagrelor) 90 mg bid prior to admission, now on aspirin 325 mg daily. No AC now given large infarct size.  May consider [facility] later given cardiomyopathy with low EF. Therapy recommendations:  pending Disposition:  pending  Respiratory failure Patient remains intubated 09/16/21 no breathing effort on weaning trial 09/17/21 tachypnea with PSV mode Not candidate for extubation today Management per CCM  Elevated WBC  WBC 15. Possible aspiration pneumonia.  Cefepime managed by CCM.  Cardiomyopathy 07/2021 EF 20 to 25% This admission EF 25 to 30% Home meds including Imdur and Coreg Currently not on home meds due to hypotension Management per CCM No AC now given large infarct size.  May consider [facility] later given low EF and stroke.  Hx of hypertension Hypotension Home meds:  coreg 3.125 mg BID BP on the low end On levophed intermittently Long-term BP goal normotensive On midodrine 10 TID started today.  Hyperlipidemia Home meds:  atorvastatin 80 mg daily, resumed in hospital LDL 74, goal < 70  zetia 10 mg daily and atorvastatin 80mg  Continue statin at discharge    Diabetes Hemoglobin A1c 5.5, uncontrolled Hyperglycemia On insulin SSI CBG monitoring Close PCP follow-up for better DM control Diabetic consult.  Other Stroke Risk Factors Advanced Age >/= 72  Former cigarette smoker Hx stroke Coronary artery disease PVD  Other Active Problems ESRD on IHD - Appreciate nephrology recommendations - HD today Right LE AKA Initial CT showed C1-C2 subluxation with anterior spinal cord compression, similar as prior.  MRI C-spine showed diffuse cervical spine spinal canal stenosis with cord flattening but no cord signal abnormality. Anemia due to ESRD, hemoglobin 9.9-> 8.8 Leukocytosis WBC  15  Hospital day # 5   ATTENDING ATTESTATION:    This patient is critically ill due to respiratory distress, large right MCA and PCA stroke, right ICA occlusion status post thrombectomy, cardiomyopathy with low EF, ESRD on dialysis, and at significant risk of neurological worsening, death form heart failure, respiratory failure, recurrent stroke, bleeding from [facility], seizure, sepsis. This patient's care requires constant monitoring of vital signs, hemodynamics, respiratory and cardiac monitoring, review of multiple databases, neurological assessment, discussion with family, other specialists and medical decision making of high complexity. I  spent 35 minutes of neurocritical care time in the care of this patient.   Florean Hoobler,MD   I discussed with Dr. Vaughan Browner CCM.    To contact Stroke Continuity provider, please refer to [email or web address] After hours, contact General Neurology

## 2021-09-19 NOTE — Progress Notes (Signed)
An USGPIV (ultrasound guided PIV) has been placed for short-term vasopressor infusion. A correctly placed ivWatch must be used when administering Vasopressors. Should this treatment be needed beyond 72 hours, central line access should be obtained.  It will be the responsibility of the bedside nurse to follow best practice to prevent extravasations.   ?

## 2021-09-19 NOTE — Progress Notes (Signed)
[facility] Progress Note  Subjective: pt seen in ICU, on the vent and sedated  Vitals:   09/19/21 1115 09/19/21 1145 09/19/21 1200 09/19/21 1215  BP: (!) 72/47 (!) 151/87 (!) 126/93 (!) 141/58  Pulse: 93 80 (!) 56 81  Resp: (!) 31 (!) 24 (!) 22 (!) 32  Temp:      TempSrc:      SpO2: 98% 100% 92% 100%  Weight:      Height:        Exam: on vent ,sedated  no jvd  throat ett in place  Chest cta bilat and lat  Cor reg no RG  Abd soft ntnd no ascites   Ext no LE edema   Neuro on vent and sedated, not following commands   RUA AVG +bruit      Home meds include - asa, lipitor, coreg 3.125 bid, hydralazine tid, insulin glargine, imdur, sl ntg, percocet, seroquel, renvela 2 ac tid, brilinta, vits/ supps        OP HD: [facility] MWF    3h  70kg  F200 2/2.5 bath  350/600  RUA AVG  Hep 1000 then 500u/hr  - aranesp 35 ug q wk  - venofer 6m weekly   - hectorol 2.5 ug IV tiw  - last HD 12/23      CXR 12/27 - IMPRESSION:  New perihilar and left lower lobe airspace opacity suspicious for aspiration or edema.     Assessment/ Plan: Acute CVA / R ICA occlusion - sp mech thrombectomy of R ICA 12/27 VDRF - on vent , per CCM, slow to wean Hypotension - remains on low dose levo gtt. Midodrine started.  Volume - no edema per CXR's, 4kg up on admit and now 1.8 kg over. Goal UF 2 L w/ HD tomorrow.  Fever/ ^wbc - poss PNA, on IV cefepime per CCM.  F/u CXR 12/30 showed improved infiltrates, no edema.   Recent +COVID - on last admit, off precautions now ESRD - on HD MWF. HD Monday.  H/o CM - LVEF 20-25% last echo HTN - BP's low here, home BP med on hold Anemia ckd - Hb 7.5- 9 here, follow, transfuse prn. Cont darbe 40 ug SQ weekly while here MBD ckd - phos low, holding renvela; Ca okay, cont vdra tiw     Rob Daiton Cowles 09/19/2021, 12:23 PM   Recent Labs  Lab 09/18/21 0541 09/18/21 1802 09/19/21 0244  K 3.8  --  4.8  BUN 32*  --  49*  CREATININE 4.28*  --  6.14*  CALCIUM  7.6*  --  8.3*  PHOS 2.4* 3.0  --   HGB 7.8*  --  8.2*    Inpatient medications:  aspirin  325 mg Per Tube Daily   Or   aspirin  300 mg Rectal Daily   atorvastatin  80 mg Per Tube Daily   chlorhexidine gluconate (MEDLINE KIT)  15 mL Mouth Rinse BID   Chlorhexidine Gluconate Cloth  6 each Topical Daily   [START ON 09/22/2021] darbepoetin (ARANESP) injection - NON-DIALYSIS  40 mcg Subcutaneous Q Wed-1800   ezetimibe  10 mg Per Tube Daily   feeding supplement (PROSource TF)  45 mL Per Tube BID   heparin injection (subcutaneous)  5,000 Units Subcutaneous Q8H   insulin aspart  0-15 Units Subcutaneous Q4H   insulin glargine-yfgn  10 Units Subcutaneous BID   mouth rinse  15 mL Mouth Rinse 10 times per day   midodrine  5 mg Per  Tube TID WC   pantoprazole sodium  40 mg Per Tube QHS   polyethylene glycol  17 g Per Tube Daily    sodium chloride Stopped (09/14/21 1609)   ceFEPime (MAXIPIME) IV Stopped (09/19/21 1118)   feeding supplement (VITAL 1.5 CAL) 45 mL/hr at 09/19/21 1200   norepinephrine (LEVOPHED) Adult infusion 6 mcg/min (09/19/21 1200)   acetaminophen **OR** acetaminophen (TYLENOL) oral liquid 160 mg/5 mL **OR** acetaminophen, ondansetron (ZOFRAN) IV, senna-docusate

## 2021-09-19 NOTE — Progress Notes (Signed)
NAME:  Jenna Brown, MRN:  [id number], DOB:  November 10, 1950, LOS: 5 ADMISSION DATE:  09/14/2021, CONSULTATION DATE:  09/14/2021 REFERRING MD: Dr. Debbrah Alar, CHIEF COMPLAINT: Acute stroke s/p thrombectomy  History of Present Illness:  71 year old with ESRD on hemodialysis, coronary artery disease, peripheral vascular disease status post AKA continue with acute right ICA stroke s/p mechanical thrombectomy.  PCCM consulted for help with vent management  She had a recent admission for COVID-19 infection treated with monotherapy or as an outpatient.  She then developed chest pain, possible non-ST elevation MI  Pertinent  Medical History    has a past medical history of Anemia, Anxiety, Arthritis, Cataract, CKD (chronic kidney disease), stage IV ([place]), Coronary artery disease (2016), Full dentures, GERD (gastroesophageal reflux disease), H/O hiatal hernia, Headache(784.0), Hemorrhoids, AKA (above knee amputation), right ([place]), Hyperlipidemia, Hypertension, Migraines, Myocardial infarction ([place]) (1990's), NSTEMI (non-ST elevated myocardial infarction) ([place]), Peripheral vascular disease ([place]), PONV (postoperative nausea and vomiting), Stroke ([place]), Type II diabetes mellitus ([place]), and Vertigo.   Significant Hospital Events: Including procedures, antibiotic start and stop dates in addition to other pertinent events   12/27 PCCM consult 12/28 CT head with small amount of subarachnoid hyperdensity in posterior right hemisphere, Contrast suspected. MRI CSP moderate to severe spinal canal stenosis MRI/A head Acute infarcts right MCA and PCA territory, most prominent in the right temporal lobe and right occipital lobe.  ECHO EF 25-30%, global hypokinesis, Grade I DD, RVSF normal 12/31 low blood pressure while on dialysis last night.  Was briefly on pressors.  Continues to fail pressure support weans due to desats  Interim History / Subjective:   Continues on Precedex. Levophed weaned off  Objective   Blood  pressure (!) 89/60, pulse 74, temperature 100.1 F (37.8 C), temperature source Axillary, resp. rate (!) 21, height 5\' 4"  (1.626 m), weight 71.8 kg, SpO2 100 %.    Vent Mode: PSV;CPAP FiO2 (%):  [40 %] 40 % Set Rate:  [16 bmp] 16 bmp Vt Set:  [430 mL] 430 mL PEEP:  [5 cmH20] 5 cmH20 Pressure Support:  [15 cmH20] 15 cmH20 Plateau Pressure:  [19 cmH20] 19 cmH20   Intake/Output Summary (Last 24 hours) at 09/19/2021 0910 Last data filed at 09/19/2021 0700 Gross per 24 hour  Intake 1549.57 ml  Output 195 ml  Net 1354.57 ml   Filed Weights   09/15/21 0240 09/15/21 1415 09/15/21 1715  Weight: 74.2 kg 71.6 kg 71.8 kg    Gen:      No acute distress HEENT:  EOMI, sclera anicteric, ETT Neck:     No masses; no thyromegaly Lungs:    Clear to auscultation bilaterally; normal respiratory effort CV:         Regular rate and rhythm; no murmurs Abd:      + bowel sounds; soft, non-tender; no palpable masses, no distension Ext:    No edema; adequate peripheral perfusion Skin:      Warm and dry; no rash Neuro: Sedated  Lab/imaging reviewed Significant for glucose 290, creatinine 6.14 WBC count remains elevated at 15.4 No new imaging  Resolved Hospital Problem list     Assessment & Plan:  Acute ischemic stroke with occlusion of proximal right internal carotid S/p mechanical thrombectomy, (TICI 3), CT head 12/28 with small amount of subarachnoid hyperdensity in posterior right hemisphere, Contrast suspected. 12/28 MRI CSP moderate to severe spinal canal stenosis MRI/A head acute infarcts right MCA and PCA territory, most prominent in the right temporal lobe and right occipital lobe. -  management of stroke and workup per neurology -ASA/Statin -No anticoagulant at this time per neruology -SBP goal 120-140 -Continue neuroprotective measures- normothermia, euglycemia, HOB greater than 30, head in neutral alignment, normocapnia, normoxia.   Acute respiratory failure with hypoxia COVID-19 Positive-  asymptomatic Intubated for mechanical thrombectomy, ABG 7.3/44/101/22 on 12/27 on 70% FiO2. COVID+ 1 month ago and on 12/27. Do not suspect active COVID infection contributing to resp failure. Continued issues with PS trials. Patient remains tachypnic with low tidal volumes on high PS.  Continue vent support On cefepime for possible hospital-acquired pneumonia as WBC count is higher Tracheal aspirate is pending  Intermittent hypotension Received albumin yesterday Weaning off pressors  Starting midodrine  End-stage renal disease on hemodialysis (MWF) Hyponatremia Hemodialysis per nephrology  Severe ischemic cardiomyopathy HX HTN HX HLD 12/29 ECHO EF 25-30%, global hypokinesis, Grade I DD, RVSF normal. Remains off pressors. -Hold metoprolol s/p hypotension after initiation -Continue ASA, statin, zetia -continue tele  DM2 SSI, long-acting insulin  GERD PPI  Anemia of chronic illness Hx ESRD Follow CBC  Best Practice (right click and "Reselect all SmartList Selections" daily)   Diet/type: tubefeeds DVT prophylaxis: SCD GI prophylaxis: PPI Lines: N/A Foley:  N/A Code Status:  full code Last date of multidisciplinary goals of care discussion [ per primary]   Critical care time:    The patient is critically ill with multiple organ system failure and requires high complexity decision making for assessment and support, frequent evaluation and titration of therapies, advanced monitoring, review of radiographic studies and interpretation of complex data.   Critical Care Time devoted to patient care services, exclusive of separately billable procedures, described in this note is 35 minutes.   Marshell Garfinkel MD [facility] See Amion for pager  If no response to pager , please call [phone] until 7pm After 7:00 pm call Elink  [phone] 09/19/2021, 9:10 AM

## 2021-09-20 DIAGNOSIS — I639 Cerebral infarction, unspecified: Secondary | ICD-10-CM | POA: Diagnosis not present

## 2021-09-20 LAB — BASIC METABOLIC PANEL
Anion gap: 15 (ref 5–15)
BUN: 71 mg/dL — ABNORMAL HIGH (ref 8–23)
CO2: 23 mmol/L (ref 22–32)
Calcium: 8.5 mg/dL — ABNORMAL LOW (ref 8.9–10.3)
Chloride: 99 mmol/L (ref 98–111)
Creatinine, Ser: 7.4 mg/dL — ABNORMAL HIGH (ref 0.44–1.00)
GFR, Estimated: 6 mL/min — ABNORMAL LOW (ref >60)
Glucose, Bld: 357 mg/dL — ABNORMAL HIGH (ref 70–99)
Potassium: 5.7 mmol/L — ABNORMAL HIGH (ref 3.5–5.1)
Sodium: 137 mmol/L (ref 135–145)

## 2021-09-20 LAB — CULTURE, RESPIRATORY W GRAM STAIN

## 2021-09-20 LAB — CBC
HCT: 25.8 % — ABNORMAL LOW (ref 36.0–46.0)
Hemoglobin: 7.6 g/dL — ABNORMAL LOW (ref 12.0–15.0)
MCH: 28.9 pg (ref 26.0–34.0)
MCHC: 29.5 g/dL — ABNORMAL LOW (ref 30.0–36.0)
MCV: 98.1 fL (ref 80.0–100.0)
Platelets: 231 10*3/uL (ref 150–400)
RBC: 2.63 MIL/uL — ABNORMAL LOW (ref 3.87–5.11)
RDW: 14.5 % (ref 11.5–15.5)
WBC: 13.8 10*3/uL — ABNORMAL HIGH (ref 4.0–10.5)
nRBC: 0 % (ref 0.0–0.2)

## 2021-09-20 LAB — GLUCOSE, CAPILLARY
Glucose-Capillary: 324 mg/dL — ABNORMAL HIGH (ref 70–99)
Glucose-Capillary: 320 mg/dL — ABNORMAL HIGH (ref 70–99)
Glucose-Capillary: 280 mg/dL — ABNORMAL HIGH (ref 70–99)
Glucose-Capillary: 190 mg/dL — ABNORMAL HIGH (ref 70–99)
Glucose-Capillary: 212 mg/dL — ABNORMAL HIGH (ref 70–99)

## 2021-09-20 MED ORDER — INSULIN GLARGINE-YFGN 100 UNIT/ML ~~LOC~~ SOLN
5.0000 [IU] | SUBCUTANEOUS | Status: AC
  Administered 2021-09-20: 5 [IU] via SUBCUTANEOUS
  Filled 2021-09-20: qty 0.05

## 2021-09-20 MED ORDER — MIDODRINE HCL 5 MG PO TABS
10.0000 mg | ORAL_TABLET | Freq: Three times a day (TID) | ORAL | Status: DC
  Administered 2021-09-20 – 2021-09-27 (×24): 10 mg
  Filled 2021-09-20 (×24): qty 2

## 2021-09-20 MED ORDER — NEPRO/CARBSTEADY PO LIQD
1000.0000 mL | ORAL | Status: DC
  Administered 2021-09-20 – 2021-09-21 (×2): 1000 mL
  Filled 2021-09-20 (×2): qty 1000

## 2021-09-20 MED ORDER — INSULIN GLARGINE-YFGN 100 UNIT/ML ~~LOC~~ SOLN
15.0000 [IU] | Freq: Two times a day (BID) | SUBCUTANEOUS | Status: DC
  Administered 2021-09-20 – 2021-09-21 (×3): 15 [IU] via SUBCUTANEOUS
  Filled 2021-09-20 (×5): qty 0.15

## 2021-09-20 MED ORDER — INSULIN ASPART 100 UNIT/ML IJ SOLN
4.0000 [IU] | INTRAMUSCULAR | Status: DC
  Administered 2021-09-20 – 2021-09-23 (×18): 4 [IU] via SUBCUTANEOUS

## 2021-09-20 MED ORDER — DOXERCALCIFEROL 4 MCG/2ML IV SOLN
2.5000 ug | INTRAVENOUS | Status: DC
  Administered 2021-09-20 – 2021-10-01 (×4): 2.5 ug via INTRAVENOUS
  Filled 2021-09-20 (×7): qty 2

## 2021-09-20 MED ORDER — DARBEPOETIN ALFA 100 MCG/0.5ML IJ SOSY
100.0000 ug | PREFILLED_SYRINGE | INTRAMUSCULAR | Status: DC
  Filled 2021-09-20: qty 0.5

## 2021-09-20 MED ORDER — DARBEPOETIN ALFA 100 MCG/0.5ML IJ SOSY: 100.0000 ug | PREFILLED_SYRINGE | INTRAMUSCULAR | Status: DC

## 2021-09-20 NOTE — Progress Notes (Addendum)
STROKE TEAM PROGRESS NOTE   INTERVAL HISTORY Patient is seen in her room with her RN at the bedside.  She has not yet been able to wean from the ventilator and still has copious, thick secretions.  Will attempt to extubate again today.  Vitals:   09/20/21 1215 09/20/21 1230 09/20/21 1300 09/20/21 1400  BP: 121/63 125/64 119/66 (!) 118/56  Pulse: 63 (!) 57 (!) 54 (!) 59  Resp: 16 16 16 16   Temp:      TempSrc:      SpO2: 96% 100% 98% 99%  Weight:      Height:       CBC:  Recent Labs  Lab 09/14/21 0909 09/14/21 0915 09/19/21 0244 09/20/21 0652  WBC 10.5   < > 15.4* 13.8*  NEUTROABS 8.8*  --   --   --   HGB 10.8*   < > 8.2* 7.6*  HCT 36.6   < > 27.1* 25.8*  MCV 99.2   < > 97.5 98.1  PLT 167   < > 197 231   < > = values in this interval not displayed.    Basic Metabolic Panel:  Recent Labs  Lab 09/18/21 0541 09/18/21 1802 09/19/21 0244 09/20/21 0652  NA 134*  --  138 137  K 3.8  --  4.8 5.7*  CL 103  --  101 99  CO2 25  --  25 23  GLUCOSE 240*  --  290* 357*  BUN 32*  --  49* 71*  CREATININE 4.28*  --  6.14* 7.40*  CALCIUM 7.6*  --  8.3* 8.5*  MG 2.2 2.2  --   --   PHOS 2.4* 3.0  --   --     Lipid Panel:  Recent Labs  Lab 09/15/21 0429  CHOL 150  TRIG 114  HDL 53  CHOLHDL 2.8  VLDL 23  LDLCALC 74    HgbA1c:  Recent Labs  Lab 09/15/21 0429  HGBA1C 5.5    Urine Drug Screen:  Recent Labs  Lab 09/14/21 1640  LABOPIA NONE DETECTED  COCAINSCRNUR NONE DETECTED  LABBENZ NONE DETECTED  AMPHETMU NONE DETECTED  THCU NONE DETECTED  LABBARB NONE DETECTED     Alcohol Level  Recent Labs  Lab 09/14/21 1103  ETH <10     IMAGING past 24 hours No results found.  PHYSICAL EXAM  Temp:  [98.2 F (36.8 C)-99.9 F (37.7 C)] 98.9 F (37.2 C) (01/02 1200) Pulse Rate:  [39-88] 59 (01/02 1400) Resp:  [15-31] 16 (01/02 1400) BP: (87-182)/(33-76) 118/56 (01/02 1400) SpO2:  [94 %-100 %] 99 % (01/02 1400) FiO2 (%):  [40 %] 40 % (01/02 1119)  General  - intubated on Precedex, in no acute distress  Cardiovascular - Regular rate and rhythm.  Neurological: Intubated sedated.  Eyes are closed but can be aroused and follows commands PERRL, Right gaze preference.  Does not blink to threat on the left.  Will move all extremities in response to commands with 5/5 strength in RUE and RLE, 2/5 strength in LUE and 3/5 strength in LLE.    ASSESSMENT/PLAN Ms. KOLBI TOFTE is a 71 y.o. female with history of ESRD on IHD, NSTEMI, CAD, HTN, HLD, right AKA, stroke and T2DM presenting with left sided weakness, left facial droop and slurred speech.  She was brought to the ED.  TNK was not given as she presented outside the window.  CT angiography revealed LVO in the proximal right ICA with perfusion study  showing core infarct of 29cc and penumbra of 168cc in right MCA and PCA territories. Mechanical thrombectomy was performed with TICI 3 flow achieved.  Still intubated with fluctuations in BP. 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 Bilateral basal ganglia and left cerbellar chronic infarcts, chronic C1-C2 subluxation. ASPECTS 10.    CTA head & neck emergent LVO at proximal right ICA, isolated right M1 segment and fetal type right PCA, left vertebral artery occlusion, stenotic right vertebral artery, high grade mid basilar stenosis CT perfusion 29/197 Status post IR with TICI3 at right ICA.  MRI  acute infarcts in right MCA and PCA territory, punctate foci of restricted diffusion in bilateral forntal and parietal lobes as well as left occipital lobe MRA  resolution of right ICA occlusion  2D Echo EF 25-30%, no LV thrombus LDL 74 HgbA1c 5.5 UDS negative VTE prophylaxis -Heparin subcu aspirin 81 mg daily and Brilinta (ticagrelor) 90 mg bid prior to admission, now on aspirin 325 mg daily. No AC now given large infarct size.  May consider  [facility] later given cardiomyopathy with low EF. Therapy recommendations:  pending Disposition:  pending  Respiratory failure Patient remains intubated 09/16/21 no breathing effort on weaning trial 09/17/21 tachypnea with PSV mode Not candidate for extubation today Management per CCM  Elevated WBC  WBC 15. Possible aspiration pneumonia.  Cefepime managed by CCM.  Cardiomyopathy 07/2021 EF 20 to 25% This admission EF 25 to 30% Home meds including Imdur and Coreg Currently not on home meds due to hypotension Management per CCM No AC now given large infarct size.  May consider [facility] later given low EF and stroke.  Hx of hypertension Hypotension Home meds:  coreg 3.125 mg BID BP on the low end On levophed intermittently Long-term BP goal normotensive On midodrine 10 TID started today.  Hyperlipidemia Home meds:  atorvastatin 80 mg daily, resumed in hospital LDL 74, goal < 70  zetia 10 mg daily and atorvastatin 80mg  Continue statin at discharge    Diabetes Hemoglobin A1c 5.5, uncontrolled Hyperglycemia On insulin SSI CBG monitoring Close PCP follow-up for better DM control Diabetic consult.  Other Stroke Risk Factors Advanced Age >/= 30  Former cigarette smoker Hx stroke Coronary artery disease PVD  Other Active Problems ESRD on IHD - Appreciate nephrology recommendations  Right LE AKA Initial CT showed C1-C2 subluxation with anterior spinal cord compression, similar as prior.  MRI C-spine showed diffuse cervical spine spinal canal stenosis with cord flattening but no cord signal abnormality. Anemia due to ESRD, hemoglobin 9.9-> 8.8-> 7.6 Leukocytosis WBC 15-> 13.8  Hospital day # [place] , MSN, AGACNP-BC [facility] See Amion for schedule and pager information 09/20/2021 2:42 PM   STROKE MD NOTE :  I have personally obtained history,examined this patient, reviewed notes, independently viewed imaging studies, participated in medical  decision making and plan of care.ROS completed by me personally and pertinent positives fully documented  I have made any additions or clarifications directly to the above note. Agree with note above.  Patient remains with respiratory failure requiring ventilatory support.  Extubate as tolerated as per critical care team.  Continue aspirin for now.  Discussed with Dr. Vaughan Browner and critical care medicine.This patient is critically ill and at significant risk of neurological worsening, death and care requires constant monitoring of vital signs, hemodynamics,respiratory and cardiac  monitoring, extensive review of multiple databases, frequent neurological assessment, discussion with family, other specialists and medical decision making of high complexity.I have made any additions or clarifications directly to the above note.This critical care time does not reflect procedure time, or teaching time or supervisory time of PA/NP/Med Resident etc but could involve care discussion time.  I spent 30 minutes of neurocritical care time  in the care of  this patient.      Antony Contras, MD Medical Director [facility] Pager: [phone] 09/20/2021 4:01 PM    To contact Stroke Continuity provider, please refer to [email or web address] After hours, contact General Neurology

## 2021-09-20 NOTE — Progress Notes (Signed)
Inpatient Diabetes Program Recommendations  AACE/ADA: New Consensus Statement on Inpatient Glycemic Control (2015)  Target Ranges:  Prepandial:   less than 140 mg/dL      Peak postprandial:   less than 180 mg/dL (1-2 hours)      Critically ill patients:  140 - 180 mg/dL   Lab Results  Component Value Date   GLUCAP 320 (H) 09/20/2021   HGBA1C 5.5 09/15/2021    Review of Glycemic Control  Diabetes history: DM2 Outpatient Diabetes medications: Basaglar 20 BID Current orders for Inpatient glycemic control: Semglee 10 BID, Novolog 0-15 Q4H  HgbA1C - 5.5% (likely not accurate with low H/H) CBGs today: 324, 320 TF - Nepro 45/H  Inpatient Diabetes Program Recommendations:    Consider increasing Semglee to 12 units BID Consider adding TF coverage - Novolog 4 units Q4H. If TF stopped for any reason, please Hold TF coverage.  Will follow glucose trends daily.  Thank you. Lorenda Peck, RD, LDN, CDE Inpatient Diabetes Coordinator [phone]

## 2021-09-20 NOTE — Progress Notes (Signed)
[facility] Progress Note  Subjective: She remains intubated and sedated in the ICU.  last HD on 12/30 with 0.5 kg UF.  Per nursing she has had periods of apnea but also very thick secretions.  She has been on levo at 2 mcg/min and she may be able to turn this off soon.    Review of systems:  Unable to obtain 2/2 mechanical ventilation    Vitals:   09/20/21 0700 09/20/21 0715 09/20/21 0800 09/20/21 0830  BP: (!) 115/59 120/65 99/73   Pulse: 63 62 (!) 50 77  Resp: '15 16 16 18  ' Temp:   99.9 F (37.7 C)   TempSrc:   Axillary   SpO2: 100% 100% 96% 99%  Weight:      Height:        Exam:  General elderly female in bed intubated  HEENT normocephalic atraumatic keeps eyes closed through exam  Lungs Coarse mechanical breath sounds; FIO2 40 and PEEP 5 Heart S1S2 no rub Abdomen soft nontender nondistended Extremities right AKA and no left leg edema Neuro - Sedation currently running. Does not follow commands Access:  RUA AVG +bruit         OP HD: [facility] M[facility]    3h  70kg  F200 2/2.5 bath  350/600  RUA AVG  Hep 1000 then 500u/hr  - aranesp 35 ug q wk  - venofer 29m weekly   - hectorol 2.5 ug IV tiw  - last HD 12/23      CXR 12/27 - IMPRESSION:  New perihilar and left lower lobe airspace opacity suspicious for aspiration or edema.     Assessment/ Plan: Acute CVA / R ICA occlusion - s/p mech thrombectomy of R ICA 12/27 Ventilator dependent respiratory failure - on vent , per CCM. Abx per critical care.  Thicker secretions per RN Hypotension - remains on low dose levo gtt. On Midodrine.  Note has hx HTN on anti-hypertensives at home (seems to be a low dose regimen for heart failure) Volume - no edema per CXR's, 4kg up on admit and now 1.8 kg over. Goal UF 2 L w/ HD on 1/2  Fever/ ^wbc - poss PNA, on IV cefepime per CCM.  Abx per critical care. F/u CXR 12/30 showed improved infiltrates, no edema  Recent +COVID - on last admit, off precautions now ESRD - on HD per MWF  schedule.  Noted hyperkalemia - will transition to nepro H/o CM - LVEF 20-25% last echo Anemia ckd - has been on aranesp 40 mcg weekly on wednesdays - increase to 100 mcg weekly    MBD ckd - renvela was held as phos was low previously - normal on most recent check; check phos in am.  Start back hectorol with HD    Recent Labs  Lab 09/18/21 0541 09/18/21 1802 09/19/21 0244 09/20/21 0652  K 3.8  --  4.8 5.7*  BUN 32*  --  49* 71*  CREATININE 4.28*  --  6.14* 7.40*  CALCIUM 7.6*  --  8.3* 8.5*  PHOS 2.4* 3.0  --   --   HGB 7.8*  --  8.2* 7.6*   Inpatient medications:  aspirin  325 mg Per Tube Daily   Or   aspirin  300 mg Rectal Daily   atorvastatin  80 mg Per Tube Daily   chlorhexidine gluconate (MEDLINE KIT)  15 mL Mouth Rinse BID   Chlorhexidine Gluconate Cloth  6 each Topical Daily   [START ON 09/22/2021] darbepoetin (ARANESP)  injection - NON-DIALYSIS  40 mcg Subcutaneous Q Wed-1800   ezetimibe  10 mg Per Tube Daily   feeding supplement (PROSource TF)  45 mL Per Tube BID   heparin injection (subcutaneous)  5,000 Units Subcutaneous Q8H   insulin aspart  0-15 Units Subcutaneous Q4H   insulin glargine-yfgn  10 Units Subcutaneous BID   mouth rinse  15 mL Mouth Rinse 10 times per day   midodrine  10 mg Per Tube TID WC   pantoprazole sodium  40 mg Per Tube QHS   polyethylene glycol  17 g Per Tube Daily    sodium chloride Stopped (09/14/21 1609)   ceFEPime (MAXIPIME) IV Stopped (09/19/21 1118)   dexmedetomidine (PRECEDEX) IV infusion 0.3 mcg/kg/hr (09/20/21 0800)   feeding supplement (VITAL 1.5 CAL) 1,000 mL (09/20/21 0411)   norepinephrine (LEVOPHED) Adult infusion 2 mcg/min (09/20/21 0800)   acetaminophen **OR** acetaminophen (TYLENOL) oral liquid 160 mg/5 mL **OR** acetaminophen, ondansetron (ZOFRAN) IV, senna-docusate    Claudia Desanctis, MD 09/20/2021 8:59 AM

## 2021-09-20 NOTE — Progress Notes (Signed)
NAME:  Jenna Brown, MRN:  [id number], DOB:  1950-11-01, LOS: 47 ADMISSION DATE:  09/14/2021, CONSULTATION DATE:  09/14/2021 REFERRING MD: Dr. Debbrah Alar, CHIEF COMPLAINT: Acute stroke s/p thrombectomy  History of Present Illness:  71 year old with ESRD on hemodialysis, coronary artery disease, peripheral vascular disease status post AKA continue with acute right ICA stroke s/p mechanical thrombectomy.  PCCM consulted for help with vent management  She had a recent admission for COVID-19 infection treated with monotherapy or as an outpatient.  She then developed chest pain, possible non-ST elevation MI  Pertinent  Medical History    has a past medical history of Anemia, Anxiety, Arthritis, Cataract, CKD (chronic kidney disease), stage IV ([place]), Coronary artery disease (2016), Full dentures, GERD (gastroesophageal reflux disease), H/O hiatal hernia, Headache(784.0), Hemorrhoids, AKA (above knee amputation), right ([place]), Hyperlipidemia, Hypertension, Migraines, Myocardial infarction ([place]) (1990's), NSTEMI (non-ST elevated myocardial infarction) ([place]), Peripheral vascular disease ([place]), PONV (postoperative nausea and vomiting), Stroke ([place]), Type II diabetes mellitus ([place]), and Vertigo.   Significant Hospital Events: Including procedures, antibiotic start and stop dates in addition to other pertinent events   12/27 PCCM consult 12/28 CT head with small amount of subarachnoid hyperdensity in posterior right hemisphere, Contrast suspected. MRI CSP moderate to severe spinal canal stenosis MRI/A head Acute infarcts right MCA and PCA territory, most prominent in the right temporal lobe and right occipital lobe.  ECHO EF 25-30%, global hypokinesis, Grade I DD, RVSF normal 12/31 low blood pressure while on dialysis last night.  Was briefly on pressors.  Continues to fail pressure support weans due to desats. Started cefepime  Interim History / Subjective:   Continues on Precedex. Levophed weaned  off  Objective   Blood pressure 120/65, pulse 62, temperature 98.9 F (37.2 C), resp. rate 16, height 5\' 4"  (1.626 m), weight 71.8 kg, SpO2 100 %.    Vent Mode: PRVC FiO2 (%):  [40 %] 40 % Set Rate:  [16 bmp] 16 bmp Vt Set:  [430 mL] 430 mL PEEP:  [5 cmH20] 5 cmH20 Pressure Support:  [15 cmH20] [place]ace Pressure:  [20 cmH20-22 cmH20] 20 cmH20   Intake/Output Summary (Last 24 hours) at 09/20/2021 0730 Last data filed at 09/20/2021 0700 Gross per 24 hour  Intake 1579.34 ml  Output 0 ml  Net 1579.34 ml   Filed Weights   09/15/21 0240 09/15/21 1415 09/15/21 1715  Weight: 74.2 kg 71.6 kg 71.8 kg    Gen:      No acute distress HEENT:  EOMI, sclera anicteric Neck:     No masses; no thyromegaly, ETT Lungs:    Clear to auscultation bilaterally; normal respiratory effort CV:         Regular rate and rhythm; no murmurs Abd:      + bowel sounds; soft, non-tender; no palpable masses, no distension Ext:    No edema; adequate peripheral perfusion Skin:      Warm and dry; no rash Neuro: Somnolent, Arousable  Lab/imaging reviewed Significant for potassium 5.7, creatinine 7.4, WBC 13.8, hemoglobin 7.6 No new imaging  Resolved Hospital Problem list     Assessment & Plan:  Acute ischemic stroke with occlusion of proximal right internal carotid S/p mechanical thrombectomy, (TICI 3), CT head 12/28 with small amount of subarachnoid hyperdensity in posterior right hemisphere, Contrast suspected. 12/28 MRI CSP moderate to severe spinal canal stenosis MRI/A head acute infarcts right MCA and PCA territory, most prominent in the right temporal lobe and right occipital lobe. -management of stroke  and workup per neurology -ASA/Statin -No anticoagulant at this time per neruology -SBP goal 120-140 -Continue neuroprotective measures- normothermia, euglycemia, HOB greater than 30, head in neutral alignment, normocapnia, normoxia.   Acute respiratory failure with hypoxia COVID-19 Positive-  asymptomatic Intubated for mechanical thrombectomy, ABG 7.3/44/101/22 on 12/27 on 70% FiO2. COVID+ 1 month ago and on 12/27. Do not suspect active COVID infection contributing to resp failure. Continued issues with PS trials. Patient remains tachypnic with low tidal volumes on high PS.  Continue vent support On cefepime since 12/31 for possible hospital-acquired pneumonia as WBC count is higher Tracheal aspirate shows klebsiella.   Intermittent hypotension Weaning off pressors  Increase midodrine dose  End-stage renal disease on hemodialysis (MWF) Hyponatremia Hemodialysis per nephrology  Severe ischemic cardiomyopathy HX HTN HX HLD 12/29 ECHO EF 25-30%, global hypokinesis, Grade I DD, RVSF normal. Remains off pressors. Hold metoprolol s/p hypotension after initiation Continue ASA, statin, zetia Continue tele  DM2 Increase semglee dose and add tube feed coverage  GERD PPI  Anemia of chronic illness Hx ESRD Follow CBC  Best Practice (right click and "Reselect all SmartList Selections" daily)   Diet/type: tubefeeds DVT prophylaxis: SCD GI prophylaxis: PPI Lines: N/A Foley:  N/A Code Status:  full code Last date of multidisciplinary goals of care discussion [ per primary]   Critical care time:    The patient is critically ill with multiple organ system failure and requires high complexity decision making for assessment and support, frequent evaluation and titration of therapies, advanced monitoring, review of radiographic studies and interpretation of complex data.   Critical Care Time devoted to patient care services, exclusive of separately billable procedures, described in this note is 35 minutes.   Marshell Garfinkel MD [facility] See Amion for pager  If no response to pager , please call [phone] until 7pm After 7:00 pm call Elink  [phone] 09/20/2021, 7:30 AM

## 2021-09-20 NOTE — Evaluation (Signed)
Occupational Therapy Evaluation Patient Details Name: Jenna Brown MRN: [id number] DOB: 09-Oct-1950 Today's Date: 09/20/2021   History of Present Illness 71 yo female presents to [facility] on 12/27 with L hemiparesis, slurred speech. 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 Of note,"Patient has atlantodental instability with cord compression and recommend minimizing cervical manipulation especially neck extension; may benefit from neurosurgical evaluation". PMH includes recent hospitalization with covid and possible STEMI, end-stage renal disease on hemodialysis, NSTEMI, CAD, HTN, HLD, right AKA 2013, history of TIA/stroke, and type 2 diabetes mellitus.   Clinical Impression   PT admitted with CVA. Pt currently with functional limitiations due to the deficits listed below (see OT problem list). Pt with L side weakness but following simple 1 step commands with delay. Pt intubated at this time with secretions being a factor in vent tolerance during session.  Pt will benefit from skilled OT to increase their independence and safety with adls and balance to allow discharge SNF.       Recommendations for follow up therapy are one component of a multi-disciplinary discharge planning process, led by the attending physician.  Recommendations may be updated based on patient status, additional functional criteria and insurance authorization.   Follow Up Recommendations  Skilled nursing-short term rehab (<3 hours/day)    Assistance Recommended at Discharge    Functional Status Assessment  Patient has had a recent decline in their functional status and demonstrates the ability to make significant improvements in function in a reasonable and predictable amount of time.  Equipment Recommendations  BSC/3in1;Wheelchair (measurements OT);Wheelchair cushion (measurements OT);Hospital bed    Recommendations for  Other Services PT consult     Precautions / Restrictions Precautions Precautions: Fall Precaution Comments: on vent wrist restraints      Mobility Bed Mobility Overal bed mobility: Needs Assistance Bed Mobility: Rolling Rolling: Total assist              Transfers                   General transfer comment: deferred pt placed in chair position and tolerated and return to supine      Balance                                           ADL either performed or assessed with clinical judgement   ADL Overall ADL's : Needs assistance/impaired                                       General ADL Comments: total (A) for all adls at this time     Vision   Additional Comments: unknown using central vision and R visual fields. pt does not turn head towar L. Vent is on the L side     Perception     Praxis      Pertinent Vitals/Pain Pain Assessment: No/denies pain     Hand Dominance Right   Extremity/Trunk Assessment Upper Extremity Assessment Upper Extremity Assessment: RUE deficits/detail RUE Deficits / Details: AROM against gravity   Lower Extremity Assessment Lower Extremity Assessment: Defer to PT evaluation   Cervical / Trunk Assessment Cervical / Trunk Assessment: Kyphotic   Communication Communication Communication: Other (comment);Receptive difficulties (intubated on vent)  Cognition Arousal/Alertness: Awake/alert Behavior During Therapy: Flat affect Overall Cognitive Status: Difficult to assess Area of Impairment: Following commands                       Following Commands: Follows one step commands with increased time       General Comments: pt gesturing to stroke assessment cues without OT request. pt holding up 2 fingers and ok sign without request. pt doing multiple typical bedside assessment task automatically as if trying to predict the cues from OT. pT asking pt to touch head and able to do  that with delay and repeatingx3. Pt following 1 step but not 2 step commands this session. pt asked to write her name. pt pushing pen away several times and writing an "o" or circle shape in the air and rubbing the paper OT providing for the sensory input ( tactile)     General Comments  VSS- pt coughing two times on vent    Exercises     Shoulder Instructions      Home Living Family/patient expects to be discharged to::  (no family presen and pt unable to provdie)                                        Prior Functioning/Environment Prior Level of Function : Needs assist (information from recent admission)             Mobility Comments: transfers and propels manual w/c independently ADLs Comments: sister assists with shower tranfers and IADL        OT Problem List: Decreased strength;Decreased activity tolerance;Impaired balance (sitting and/or standing);Decreased coordination;Decreased cognition;Decreased safety awareness;Decreased knowledge of use of DME or AE;Decreased knowledge of precautions;Cardiopulmonary status limiting activity;Obesity;Impaired UE functional use      OT Treatment/Interventions: Self-care/ADL training;Therapeutic exercise;Neuromuscular education;Energy conservation;DME and/or AE instruction;Manual therapy;Modalities;Therapeutic activities;Cognitive remediation/compensation;Visual/perceptual remediation/compensation;Patient/family education;Balance training    OT Goals(Current goals can be found in the care plan section) Acute Rehab OT Goals OT Goal Formulation: Patient unable to participate in goal setting Time For Goal Achievement: 10/04/21 Potential to Achieve Goals: Good  OT Frequency: Min 2X/week   Barriers to D/C:            Co-evaluation              AM-PAC OT "6 Clicks" Daily Activity     Outcome Measure Help from another person eating meals?: Total Help from another person taking care of personal grooming?:  Total Help from another person toileting, which includes using toliet, bedpan, or urinal?: Total Help from another person bathing (including washing, rinsing, drying)?: Total Help from another person to put on and taking off regular upper body clothing?: Total Help from another person to put on and taking off regular lower body clothing?: Total 6 Click Score: 6   End of Session Equipment Utilized During Treatment: Oxygen Nurse Communication: Mobility status;Precautions  Activity Tolerance: Patient tolerated treatment well Patient left: in bed;with call bell/phone within reach;with bed alarm set;with SCD's reapplied  OT Visit Diagnosis: Muscle weakness (generalized) (M62.81)                Time: 5102-5852 OT Time Calculation (min): 11 min Charges:  OT General Charges $OT Visit: 1 Visit OT Evaluation $OT Eval Moderate Complexity: 1 Mod   Brynn, OTR/L  Acute Rehabilitation Services Pager: [phone] Office: [phone] .   Jeri Modena  09/20/2021, 5:16 PM

## 2021-09-20 NOTE — Progress Notes (Signed)
SLP Cancellation Note  Patient Details Name: Jenna Brown MRN: [id number] DOB: 11-28-1950   Cancelled treatment:       Reason Eval/Treat Not Completed: Medical issues which prohibited therapy (remains on vent). Will continue to follow.    Osie Bond., M.A. [facility] Pager [phone] Office [phone]  09/20/2021, 8:47 AM

## 2021-09-20 NOTE — Progress Notes (Signed)
eLink Physician-Brief Progress Note Patient Name: Jenna Brown DOB: 03/25/1951 MRN: [id number]   Date of Service  09/20/2021  HPI/Events of Note  Received request for renewal of restraints Patient seen intubated and a risk for self harm by pulling lines and tubes   eICU Interventions  Bilateral soft wrist restraints renewed Bedside team to assess in am if restraints to be continued      Intervention Category Minor Interventions: Agitation / anxiety - evaluation and management  Judd Lien 09/20/2021, 10:39 PM

## 2021-09-21 ENCOUNTER — Inpatient Hospital Stay (HOSPITAL_COMMUNITY): Payer: Medicare Other

## 2021-09-21 DIAGNOSIS — I639 Cerebral infarction, unspecified: Secondary | ICD-10-CM | POA: Diagnosis not present

## 2021-09-21 LAB — POCT I-STAT 7, (LYTES, BLD GAS, ICA,H+H)
Acid-Base Excess: 3 mmol/L — ABNORMAL HIGH (ref 0.0–2.0)
Bicarbonate: 27.5 mmol/L (ref 20.0–28.0)
Calcium, Ion: 1.19 mmol/L (ref 1.15–1.40)
HCT: 25 % — ABNORMAL LOW (ref 36.0–46.0)
Hemoglobin: 8.5 g/dL — ABNORMAL LOW (ref 12.0–15.0)
O2 Saturation: 99 %
Patient temperature: 98.8
Potassium: 4.3 mmol/L (ref 3.5–5.1)
Sodium: 136 mmol/L (ref 135–145)
TCO2: 29 mmol/L (ref 22–32)
pCO2 arterial: 42.1 mmHg (ref 32.0–48.0)
pH, Arterial: 7.424 (ref 7.350–7.450)
pO2, Arterial: 116 mmHg — ABNORMAL HIGH (ref 83.0–108.0)

## 2021-09-21 LAB — BASIC METABOLIC PANEL
Anion gap: 13 (ref 5–15)
BUN: 91 mg/dL — ABNORMAL HIGH (ref 8–23)
CO2: 23 mmol/L (ref 22–32)
Calcium: 8.7 mg/dL — ABNORMAL LOW (ref 8.9–10.3)
Chloride: 101 mmol/L (ref 98–111)
Creatinine, Ser: 8.21 mg/dL — ABNORMAL HIGH (ref 0.44–1.00)
GFR, Estimated: 5 mL/min — ABNORMAL LOW (ref >60)
Glucose, Bld: 239 mg/dL — ABNORMAL HIGH (ref 70–99)
Potassium: 6.2 mmol/L — ABNORMAL HIGH (ref 3.5–5.1)
Sodium: 137 mmol/L (ref 135–145)

## 2021-09-21 LAB — CBC
HCT: 27.8 % — ABNORMAL LOW (ref 36.0–46.0)
Hemoglobin: 8.4 g/dL — ABNORMAL LOW (ref 12.0–15.0)
MCH: 29.6 pg (ref 26.0–34.0)
MCHC: 30.2 g/dL (ref 30.0–36.0)
MCV: 97.9 fL (ref 80.0–100.0)
Platelets: 253 10*3/uL (ref 150–400)
RBC: 2.84 MIL/uL — ABNORMAL LOW (ref 3.87–5.11)
RDW: 14.6 % (ref 11.5–15.5)
WBC: 14.1 10*3/uL — ABNORMAL HIGH (ref 4.0–10.5)
nRBC: 0 % (ref 0.0–0.2)

## 2021-09-21 LAB — GLUCOSE, CAPILLARY
Glucose-Capillary: 137 mg/dL — ABNORMAL HIGH (ref 70–99)
Glucose-Capillary: 165 mg/dL — ABNORMAL HIGH (ref 70–99)
Glucose-Capillary: 170 mg/dL — ABNORMAL HIGH (ref 70–99)
Glucose-Capillary: 209 mg/dL — ABNORMAL HIGH (ref 70–99)
Glucose-Capillary: 231 mg/dL — ABNORMAL HIGH (ref 70–99)
Glucose-Capillary: 248 mg/dL — ABNORMAL HIGH (ref 70–99)
Glucose-Capillary: 258 mg/dL — ABNORMAL HIGH (ref 70–99)

## 2021-09-21 LAB — PHOSPHORUS: Phosphorus: 3.9 mg/dL (ref 2.5–4.6)

## 2021-09-21 MED ORDER — CHLORHEXIDINE GLUCONATE CLOTH 2 % EX PADS
6.0000 | MEDICATED_PAD | Freq: Every day | CUTANEOUS | Status: DC
  Administered 2021-09-22 – 2021-09-23 (×2): 6 via TOPICAL

## 2021-09-21 MED ORDER — SODIUM CHLORIDE 0.9 % IV SOLN
1.0000 g | INTRAVENOUS | Status: DC
  Administered 2021-09-21 – 2021-09-22 (×2): 1 g via INTRAVENOUS
  Filled 2021-09-21 (×4): qty 10

## 2021-09-21 MED ORDER — NEPRO/CARBSTEADY PO LIQD
1000.0000 mL | ORAL | Status: DC
  Administered 2021-09-21 – 2021-09-28 (×7): 1000 mL
  Filled 2021-09-21 (×10): qty 1000

## 2021-09-21 MED ORDER — SENNOSIDES 8.8 MG/5ML PO SYRP: 5.0000 mL | ORAL_SOLUTION | Freq: Every evening | ORAL | Status: DC | PRN

## 2021-09-21 MED ORDER — IPRATROPIUM-ALBUTEROL 0.5-2.5 (3) MG/3ML IN SOLN
3.0000 mL | Freq: Four times a day (QID) | RESPIRATORY_TRACT | Status: DC
  Administered 2021-09-21 – 2021-09-29 (×29): 3 mL via RESPIRATORY_TRACT
  Filled 2021-09-21 (×26): qty 3

## 2021-09-21 MED ORDER — NEPRO/CARBSTEADY PO LIQD
1000.0000 mL | ORAL | Status: DC
  Administered 2021-09-21: 1000 mL

## 2021-09-21 NOTE — Progress Notes (Signed)
Acknowledged order for UA. Pt has made no urine today. MD aware.

## 2021-09-21 NOTE — Progress Notes (Signed)
Nutrition Follow-up  DOCUMENTATION CODES:   Not applicable  INTERVENTION:   Tube feeding via OG tube: Adjust Nepro to 40 ml/h (960 ml per day) Prosource TF 45 ml BID  Provides 1808 kcal, 99 gm protein, 697 ml free water daily   NUTRITION DIAGNOSIS:   Inadequate oral intake related to inability to eat as evidenced by NPO status. Ongoing  GOAL:   Patient will meet greater than or equal to 90% of their needs Met with TF  MONITOR:   TF tolerance  REASON FOR ASSESSMENT:   Consult Enteral/tube feeding initiation and management  ASSESSMENT:   Pt with PMH of ESRD on iHD, NSTEMI, CAD, HTN, HLD, R AKA, stroke, and DM admitted with R MCA with R ICA occlusion.   Pt discussed during ICU rounds and with RN.  Per Renal pt missed iHD 1/2 due to staffing issues, pt receiving iHD currently and will also receive iHD 1/4 to get back to MWF schedule.  Spoke with Pharmacy, discussed change in TF and grams of carbohydrate. Pharmacy to adjust insulin as appropriate.   Outpatient dry weight: 70 kg  Pt with L hemiparesis.   12/27 s/p IR for mechanical thrombectomy 12/29 TF started 1/2 levo weaned off; Renal switched TF to Nepro due to hyperkalemia; pt missed iHD due to staffing issues 1/3 currently receiving iHD   Patient is currently intubated on ventilator support MV: 9 L/min Temp (24hrs), Avg:98.9 F (37.2 C), Min:98.5 F (36.9 C), Max:99.5 F (37.5 C)  Medications reviewed and include: SSI, novolog, semglee, protonix, miralax  Precedex  Labs reviewed: K 6.2 (missed HD)  CBG's: 137-280  UOP: 0 ml   OG tube; tip in distal stomach  Current TF: Nepro @ 45 ml/hr with 45 ml ProSource TF BID Provides: 2024 kcal and 109 g protein   Diet Order:   Diet Order             Diet NPO time specified  Diet effective now                   EDUCATION NEEDS:   Not appropriate for education at this time  Skin:  Skin Assessment: Skin Integrity Issues: Skin Integrity  Issues:: DTI DTI: coccyx  Last BM:  1/2  Height:   Ht Readings from Last 1 Encounters:  09/14/21 '5\' 4"'  (1.626 m)    Weight:   Wt Readings from Last 1 Encounters:  09/15/21 71.8 kg    Estimated Nutritional Needs:   Kcal:  1600-1800  Protein:  85-100 grams  Fluid:  1.2 Ld/ay  Tico Crotteau P., RD, LDN, CNSC See AMiON for contact information

## 2021-09-21 NOTE — Progress Notes (Signed)
STROKE TEAM PROGRESS NOTE   INTERVAL HISTORY Patient is seen in her room with her RN at the bedside.  She getting hemodialysis at the bedside and has not yet been able to wean from the ventilator and remains on support for respiratory failure..  Will attempt to extubate again today.  Neurological exam is unchanged.  Vital signs are stable.  Vitals:   09/21/21 1500 09/21/21 1516 09/21/21 1541 09/21/21 1600  BP: (!) 149/65   140/62  Pulse: 66 66  60  Resp: 16 16  18   Temp:   98.8 F (37.1 C)   TempSrc:   Axillary   SpO2: 97% 99%  94%  Weight:      Height:       CBC:  Recent Labs  Lab 09/20/21 0652 09/21/21 0520  WBC 13.8* 14.1*  HGB 7.6* 8.4*  HCT 25.8* 27.8*  MCV 98.1 97.9  PLT 231 626   Basic Metabolic Panel:  Recent Labs  Lab 09/18/21 0541 09/18/21 1802 09/19/21 0244 09/20/21 0652 09/21/21 0520  NA 134*  --    < > 137 137  K 3.8  --    < > 5.7* 6.2*  CL 103  --    < > 99 101  CO2 25  --    < > 23 23  GLUCOSE 240*  --    < > 357* 239*  BUN 32*  --    < > 71* 91*  CREATININE 4.28*  --    < > 7.40* 8.21*  CALCIUM 7.6*  --    < > 8.5* 8.7*  MG 2.2 2.2  --   --   --   PHOS 2.4* 3.0  --   --  3.9   < > = values in this interval not displayed.   Lipid Panel:  Recent Labs  Lab 09/15/21 0429  CHOL 150  TRIG 114  HDL 53  CHOLHDL 2.8  VLDL 23  LDLCALC 74   HgbA1c:  Recent Labs  Lab 09/15/21 0429  HGBA1C 5.5   Urine Drug Screen:  Recent Labs  Lab 09/14/21 1640  LABOPIA NONE DETECTED  COCAINSCRNUR NONE DETECTED  LABBENZ NONE DETECTED  AMPHETMU NONE DETECTED  THCU NONE DETECTED  LABBARB NONE DETECTED    Alcohol Level  No results for input(s): ETH in the last 168 hours.  IMAGING past 24 hours DG CHEST PORT 1 VIEW  Result Date: 09/21/2021 CLINICAL DATA:  Hypoxia, patient having dialysis EXAM: PORTABLE CHEST 1 VIEW COMPARISON:  Chest radiograph 09/17/2021 FINDINGS: Endotracheal tube tip is approximately 2.9 cm from the carina. The enteric catheter tip  is off the field of view, but the side hole is below the level of the GE junction. Lung volumes are low. There is a small left pleural effusion with adjacent retrocardiac opacity, similar to the prior study. There is no other focal airspace disease. There is no right pleural effusion. There is no pneumothorax. The bones are stable. IMPRESSION: Low lung volumes with a small left pleural effusion and adjacent retrocardiac opacity, overall not significantly changed. No new or worsening focal airspace disease. Electronically Signed   By: Valetta Mole M.D.   On: 09/21/2021 09:25    PHYSICAL EXAM  Temp:  [98.3 F (36.8 C)-99.5 F (37.5 C)] 98.8 F (37.1 C) (01/03 1541) Pulse Rate:  [35-104] 60 (01/03 1600) Resp:  [13-35] 18 (01/03 1600) BP: (80-165)/(49-121) 140/62 (01/03 1600) SpO2:  [94 %-100 %] 94 % (01/03 1600) FiO2 (%):  [40 %]  40 % (01/03 1533)  General - intubated on Precedex, in no acute distress  Cardiovascular - Regular rate and rhythm.  Neurological: Intubated mildly sedated.  Awake and interactive.  Eyes are open and follows commands PERRL, Right gaze preference.  Does not blink to threat on the left.  Will move all extremities in response to commands with 5/5 strength in RUE and RLE, 2/5 strength in LUE and 3/5 strength in LLE.    ASSESSMENT/PLAN Jenna Brown is a 71 y.o. female with history of ESRD on IHD, NSTEMI, CAD, HTN, HLD, right AKA, stroke and T2DM presenting with left sided weakness, left facial droop and slurred speech.  She was brought to the ED.  TNK was not given as she presented outside the window.  CT angiography revealed LVO in the proximal right ICA with perfusion study showing core infarct of 29cc and penumbra of 168cc in right MCA and PCA territories. Mechanical thrombectomy was performed with TICI 3 flow achieved.  Still intubated with fluctuations in BP. Stroke:  right MCA and PCA with R ICA occlusion s/p IR with TICI3, embolic pattern likely due to  cardiomyopathy with low EF Code Stroke CT head No acute abnormality. Bilateral basal ganglia and left cerbellar chronic infarcts, chronic C1-C2 subluxation. ASPECTS 10.    CTA head & neck emergent LVO at proximal right ICA, isolated right M1 segment and fetal type right PCA, left vertebral artery occlusion, stenotic right vertebral artery, high grade mid basilar stenosis CT perfusion 29/197 Status post IR with TICI3 at right ICA.  MRI  acute infarcts in right MCA and PCA territory, punctate foci of restricted diffusion in bilateral forntal and parietal lobes as well as left occipital lobe MRA  resolution of right ICA occlusion  2D Echo EF 25-30%, no LV thrombus LDL 74 HgbA1c 5.5 UDS negative VTE prophylaxis -Heparin subcu aspirin 81 mg daily and Brilinta (ticagrelor) 90 mg bid prior to admission, now on aspirin 325 mg daily. No AC now given large infarct size.  May consider [facility] later given cardiomyopathy with low EF. Therapy recommendations:  pending Disposition:  pending  Respiratory failure Patient remains intubated 09/16/21 no breathing effort on weaning trial 09/17/21 tachypnea with PSV mode Not candidate for extubation today Management per CCM  Elevated WBC  WBC 15. Possible aspiration pneumonia.  Cefepime managed by CCM.  Cardiomyopathy 07/2021 EF 20 to 25% This admission EF 25 to 30% Home meds including Imdur and Coreg Currently not on home meds due to hypotension Management per CCM No AC now given large infarct size.  May consider [facility] later given low EF and stroke.  Hx of hypertension Hypotension Home meds:  coreg 3.125 mg BID BP on the low end On levophed intermittently Long-term BP goal normotensive On midodrine 10 TID started today.  Hyperlipidemia Home meds:  atorvastatin 80 mg daily, resumed in hospital LDL 74, goal < 70  zetia 10 mg daily and atorvastatin 80mg  Continue statin at discharge    Diabetes Hemoglobin A1c 5.5, uncontrolled Hyperglycemia On  insulin SSI CBG monitoring Close PCP follow-up for better DM control Diabetic consult.  Other Stroke Risk Factors Advanced Age >/= 9  Former cigarette smoker Hx stroke Coronary artery disease PVD  Other Active Problems ESRD on IHD - Appreciate nephrology recommendations  Right LE AKA Initial CT showed C1-C2 subluxation with anterior spinal cord compression, similar as prior.  MRI C-spine showed diffuse cervical spine spinal canal stenosis with cord flattening but no cord signal abnormality. Anemia due to  ESRD, hemoglobin 9.9-> 8.8-> 7.6 Leukocytosis WBC 15-> 13.8  Hospital day # 7  Patient remains with respiratory failure requiring ventilatory support.  Extubate as tolerated as per critical care team.  Continue aspirin for now.  Discussed with Dr. Erin Fulling from critical care medicine.This patient is critically ill and at significant risk of neurological worsening, death and care requires constant monitoring of vital signs, hemodynamics,respiratory and cardiac monitoring, extensive review of multiple databases, frequent neurological assessment, discussion with family, other specialists and medical decision making of high complexity.I have made any additions or clarifications directly to the above note.This critical care time does not reflect procedure time, or teaching time or supervisory time of PA/NP/Med Resident etc but could involve care discussion time.  I spent 30 minutes of neurocritical care time  in the care of  this patient.      Antony Contras, MD Medical Director [facility] Pager: [phone] 09/21/2021 4:21 PM    To contact Stroke Continuity provider, please refer to [email or web address] After hours, contact General Neurology

## 2021-09-21 NOTE — Progress Notes (Signed)
Occupational Therapy Treatment Patient Details Name: Jenna Brown MRN: [id number] DOB: [birth year] Today's Date: 09/21/2021   History of present illness 71 yo female presents to [facility] on 12/27 with L hemiparesis, slurred speech. 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 Of note,"Patient has atlantodental instability with cord compression and recommend minimizing cervical manipulation especially neck extension; may benefit from neurosurgical evaluation". PMH includes recent hospitalization with covid and possible STEMI, end-stage renal disease on hemodialysis, NSTEMI, CAD, HTN, HLD, right AKA 2013, history of TIA/stroke, and type 2 diabetes mellitus.   OT comments  Patient supine in bed, on HD during session, moving R UE actively but not L UE.  L UE PROM WFL, no response to noxious stimuli and maintains gaze to R (midline 2-3x with multimodal cueing). With R lateral lean in chair position, requires +2 total assist to reposition and place wedge to support trunk. Following minimal commands with delay.  Continue to recommend SNF at dc.  Will follow acutely.    Recommendations for follow up therapy are one component of a multi-disciplinary discharge planning process, led by the attending physician.  Recommendations may be updated based on patient status, additional functional criteria and insurance authorization.    Follow Up Recommendations  Skilled nursing-short term rehab (<3 hours/day)    Assistance Recommended at Discharge Frequent or constant Supervision/Assistance  Patient can return home with the following      Equipment Recommendations  BSC/3in1;Wheelchair (measurements OT);Wheelchair cushion (measurements OT);Hospital bed    Recommendations for Other Services      Precautions / Restrictions Precautions Precautions: Fall Precaution Comments: on vent, wrist  restraints Restrictions Weight Bearing Restrictions: No       Mobility Bed Mobility Overal bed mobility: Needs Assistance Bed Mobility: Rolling Rolling: Total assist;+2 for physical assistance         General bed mobility comments: rolled L with tot A +2 for wedge positioning under R side to prevent pt scooting R shoulder into R rail. With bed in chair position, pt maintained R lean but did actively straighten herself almost to midline with core musculature 2x. Worked on stimulating each extremity with bed in chair position as well as attempting to get gaze and body to modline    Transfers                   General transfer comment: deferred pt placed in chair position and tolerated and return to supine     Balance Overall balance assessment: Needs assistance Sitting-balance support: Bilateral upper extremity supported Sitting balance-Leahy Scale: Poor Sitting balance - Comments: R lean Postural control: Right lateral lean                                 ADL either performed or assessed with clinical judgement   ADL Overall ADL's : Needs assistance/impaired     Grooming: Total assistance;Bed level Grooming Details (indicate cue type and reason): total assist (hand over hand)using L hand to wipe mouth                               General ADL Comments: total (A) for all adls at this time    Extremity/Trunk Assessment Upper Extremity Assessment Upper Extremity Assessment: LUE deficits/detail LUE Deficits / Details: no purposeful movement during session, flaccid and no response  to tactile input   Lower Extremity Assessment Lower Extremity Assessment: RLE deficits/detail;LLE deficits/detail;Difficult to assess due to impaired cognition RLE Deficits / Details: R AKA, visualized R hip active flex and ext in supine LLE Deficits / Details: visualized 3/5 hip flex as pt pulled RLE from OOB back into bed, hip abd >2/5, knee ext >2/5    Cervical / Trunk Assessment Cervical / Trunk Assessment: Kyphotic    Vision   Additional Comments: pt with R gaze preference, scans to midline with mulitmodal cueing 2-3x but not past.  Does not react to threat or loud noise on L side.   Perception     Praxis      Cognition Arousal/Alertness: Awake/alert Behavior During Therapy: Flat affect Overall Cognitive Status: Difficult to assess Area of Impairment: Following commands                       Following Commands: Follows one step commands with increased time;Follows one step commands inconsistently       General Comments: pt following one step commands intermittently, RUE very active but restrained for HD but squeezes therapist's hand when cued. Pt also moves BLE when cued. R gaze preference maintain throughout, came close to midline 2-3x          Exercises Exercises: Other exercises General Exercises - Lower Extremity Ankle Circles/Pumps: AAROM;Left;10 reps;Supine Heel Slides: AAROM;Left;10 reps;Supine Hip ABduction/ADduction: AAROM;Left;10 reps;Supine Straight Leg Raises: PROM;Left;10 reps;Supine Other Exercises Other Exercises: PROM to L UE from shoulder to hand, mild edema and elevated UE   Shoulder Instructions       General Comments pt receiving HD in room, attempted coughing on vent 2-3x. HR 77 bpm, SPO2 99%, BP 133/58 after session    Pertinent Vitals/ Pain       Pain Assessment: Faces Faces Pain Scale: No hurt Facial Expression: Relaxed, neutral Body Movements: Absence of movements Muscle Tension: Relaxed Compliance with ventilator (intubated pts.): Tolerating ventilator or movement Vocalization (extubated pts.): N/A CPOT Total: 0  Home Living Family/patient expects to be discharged to:: Skilled nursing facility (no family present and pt unable to provide)                                        Prior Functioning/Environment              Frequency  Min 2X/week         Progress Toward Goals  OT Goals(current goals can now be found in the care plan section)  Progress towards OT goals: Progressing toward goals  Acute Rehab OT Goals OT Goal Formulation: Patient unable to participate in goal setting Time For Goal Achievement: 10/04/21 Potential to Achieve Goals: Good  Plan Discharge plan remains appropriate;Frequency remains appropriate    Co-evaluation    PT/OT/SLP Co-Evaluation/Treatment: Yes Reason for Co-Treatment: Complexity of the patient's impairments (multi-system involvement);Necessary to address cognition/behavior during functional activity;For patient/therapist safety PT goals addressed during session: Mobility/safety with mobility;Balance OT goals addressed during session: ADL's and self-care      AM-PAC OT "6 Clicks" Daily Activity     Outcome Measure   Help from another person eating meals?: Total Help from another person taking care of personal grooming?: Total Help from another person toileting, which includes using toliet, bedpan, or urinal?: Total Help from another person bathing (including washing, rinsing, drying)?: Total Help from another person to put on and  taking off regular upper body clothing?: Total Help from another person to put on and taking off regular lower body clothing?: Total 6 Click Score: 6    End of Session Equipment Utilized During Treatment: Other (comment) (vent)  OT Visit Diagnosis: Muscle weakness (generalized) (M62.81)   Activity Tolerance Patient tolerated treatment well   Patient Left in bed;with call bell/phone within reach;with bed alarm set;with nursing/sitter in room;with SCD's reapplied;with restraints reapplied   Nurse Communication Mobility status;Precautions        Time: 7782-4235 OT Time Calculation (min): 29 min  Charges: OT General Charges $OT Visit: 1 Visit OT Treatments $Self Care/Home Management : 8-22 mins  Jolaine Artist, OT Acute Rehabilitation Services Pager  [phone] Office [phone]   Delight Stare 09/21/2021, 11:03 AM

## 2021-09-21 NOTE — Evaluation (Signed)
Physical Therapy Evaluation Patient Details Name: Jenna Brown MRN: [id number] DOB: Feb 09, 1951 Today's Date: 09/21/2021  History of Present Illness  71 yo female presents to [facility] on 12/27 with L hemiparesis, slurred speech. 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 Of note,"Patient has atlantodental instability with cord compression and recommend minimizing cervical manipulation especially neck extension; may benefit from neurosurgical evaluation". PMH includes recent hospitalization with covid and possible STEMI, end-stage renal disease on hemodialysis, NSTEMI, CAD, HTN, HLD, right AKA 2013, history of TIA/stroke, and type 2 diabetes mellitus.  Clinical Impression  Pt admitted with above diagnosis. Pt seen by PT/OT, currently getting HD and weaning from vent. Pt trying to move RLE though restrained for HD. Moving LLE on and off bed. Needing tot A for balance with bed in chair position due to R lean. Pt following one step commands ~50% of time.  Pt currently with functional limitations due to the deficits listed below (see PT Problem List). Pt will benefit from skilled PT to increase their independence and safety with mobility to allow discharge to the venue listed below.          Recommendations for follow up therapy are one component of a multi-disciplinary discharge planning process, led by the attending physician.  Recommendations may be updated based on patient status, additional functional criteria and insurance authorization.  Follow Up Recommendations Skilled nursing-short term rehab (<3 hours/day)    Assistance Recommended at Discharge Frequent or constant Supervision/Assistance  Patient can return home with the following  Two people to help with walking and/or transfers;Two people to help with bathing/dressing/bathroom;Assistance with cooking/housework;Assistance with feeding;Assist for  transportation;Direct supervision/assist for financial management;Direct supervision/assist for medications management;Help with stairs or ramp for entrance    Equipment Recommendations Hospital bed  Recommendations for Other Services       Functional Status Assessment Patient has had a recent decline in their functional status and demonstrates the ability to make significant improvements in function in a reasonable and predictable amount of time.     Precautions / Restrictions Precautions Precautions: Fall Precaution Comments: on vent wrist restraints Restrictions Weight Bearing Restrictions: No      Mobility  Bed Mobility Overal bed mobility: Needs Assistance Bed Mobility: Rolling Rolling: Total assist;+2 for physical assistance         General bed mobility comments: rolled L with tot A +2 for wedge positioning under R side to prevent pt scooting R shoulder into R rail. With bed in chair position, pt maintained R lean but did actively straighten herself almost to midline with core musculature 2x. Worked on stimulating each extremity with bed in chair position as well as attempting to get gaze and body to modline    Transfers                   General transfer comment: deferred pt placed in chair position and tolerated and return to supine    Ambulation/Gait               General Gait Details: transfer leval PTA  Stairs            Wheelchair Mobility    Modified Rankin (Stroke Patients Only) Modified Rankin (Stroke Patients Only) Pre-Morbid Rankin Score: Moderately severe disability Modified Rankin: Severe disability     Balance Overall balance assessment: Needs assistance Sitting-balance support: Bilateral upper extremity supported Sitting balance-Leahy Scale: Poor Sitting balance - Comments: R lean Postural  control: Right lateral lean                                   Pertinent Vitals/Pain Pain Assessment: Faces Faces  Pain Scale: No hurt Facial Expression: Relaxed, neutral Body Movements: Absence of movements Muscle Tension: Relaxed Compliance with ventilator (intubated pts.): Tolerating ventilator or movement Vocalization (extubated pts.): N/A CPOT Total: 0    Home Living Family/patient expects to be discharged to:: Skilled nursing facility (no family present and pt unable to provide)                        Prior Function Prior Level of Function : Needs assist (information from recent admission)             Mobility Comments: transfers and propels manual w/c independently ADLs Comments: sister assists with shower transfers and IADL     Hand Dominance   Dominant Hand: Right    Extremity/Trunk Assessment   Upper Extremity Assessment Upper Extremity Assessment: Defer to OT evaluation    Lower Extremity Assessment Lower Extremity Assessment: RLE deficits/detail;LLE deficits/detail;Difficult to assess due to impaired cognition RLE Deficits / Details: R AKA, visualized R hip active flex and ext in supine LLE Deficits / Details: visualized 3/5 hip flex as pt pulled RLE from OOB back into bed, hip abd >2/5, knee ext >2/5    Cervical / Trunk Assessment Cervical / Trunk Assessment: Kyphotic  Communication   Communication: Other (comment);Receptive difficulties;Expressive difficulties (intubated on vent)  Cognition Arousal/Alertness: Awake/alert Behavior During Therapy: Flat affect Overall Cognitive Status: Difficult to assess Area of Impairment: Following commands                       Following Commands: Follows one step commands with increased time       General Comments: pt following one step commands intermittently, RUE very active but restrained for HD but squeezes therapist's hand when cued. Pt also moves BLE when cued. R gaze preference maintain throughout, came close to midline 2-3x        General Comments General comments (skin integrity, edema, etc.): pt  receiving HD in room, attempted coughing on vent 2-3x. HR 77 bpm, SPO2 99%, BP 133/58 after session    Exercises General Exercises - Lower Extremity Ankle Circles/Pumps: AAROM;Left;10 reps;Supine Heel Slides: AAROM;Left;10 reps;Supine Hip ABduction/ADduction: AAROM;Left;10 reps;Supine Straight Leg Raises: PROM;Left;10 reps;Supine   Assessment/Plan    PT Assessment Patient needs continued PT services  PT Problem List Decreased strength;Decreased range of motion;Decreased activity tolerance;Decreased balance;Decreased mobility;Decreased coordination;Decreased cognition;Decreased safety awareness;Decreased knowledge of precautions;Decreased knowledge of use of DME;Cardiopulmonary status limiting activity;Impaired sensation       PT Treatment Interventions DME instruction;Functional mobility training;Therapeutic activities;Therapeutic exercise;Balance training;Neuromuscular re-education;Cognitive remediation;Patient/family education;Wheelchair mobility training    PT Goals (Current goals can be found in the Care Plan section)  Acute Rehab PT Goals Patient Stated Goal: unable to state PT Goal Formulation: Patient unable to participate in goal setting Time For Goal Achievement: 10/05/21 Potential to Achieve Goals: Fair    Frequency Min 3X/week     Co-evaluation PT/OT/SLP Co-Evaluation/Treatment: Yes Reason for Co-Treatment: Complexity of the patient's impairments (multi-system involvement);Necessary to address cognition/behavior during functional activity;For patient/therapist safety PT goals addressed during session: Mobility/safety with mobility;Balance         AM-PAC PT "6 Clicks" Mobility  Outcome Measure Help needed turning from your back to your  side while in a flat bed without using bedrails?: Total Help needed moving from lying on your back to sitting on the side of a flat bed without using bedrails?: Total Help needed moving to and from a bed to a chair (including a  wheelchair)?: Total Help needed standing up from a chair using your arms (e.g., wheelchair or bedside chair)?: Total Help needed to walk in hospital room?: Total Help needed climbing 3-5 steps with a railing? : Total 6 Click Score: 6    End of Session Equipment Utilized During Treatment: Oxygen Activity Tolerance: Patient tolerated treatment well Patient left: in bed;with call bell/phone within reach Nurse Communication: Mobility status PT Visit Diagnosis: Hemiplegia and hemiparesis Hemiplegia - Right/Left: Left Hemiplegia - dominant/non-dominant: Non-dominant Hemiplegia - caused by: Cerebral infarction    Time: 0940-1009 PT Time Calculation (min) (ACUTE ONLY): 29 min   Charges:     PT Treatments $Therapeutic Activity: 8-22 mins        [place]  Pager [phone] Office [place] 09/21/2021, 10:35 AM

## 2021-09-21 NOTE — Progress Notes (Signed)
[facility] Progress Note  Subjective: Patient never got HD yesterday due to staffing - per HD RN her treatment was to be done overnight due to staffing and they were called to another patient.  Seen and examined on dialysis.  Blood pressure 101/53 and HR 53. R AVG in use.  She has had midodrine.  No pressors but and on continuous sedation.    Review of systems:   Unable to obtain 2/2 mechanical ventilation and sedation   Vitals:   09/21/21 0600 09/21/21 0700 09/21/21 0740 09/21/21 0800  BP: (!) 115/58 (!) 108/57 (P) 114/60 117/63  Pulse: (!) 55 (!) 58 (!) 58 60  Resp: '16 17 16 18  ' Temp:    99.5 F (37.5 C)  TempSrc:    Axillary  SpO2: 99% 98% 100% 99%  Weight:      Height:        Exam:  General elderly female in bed intubated   HEENT normocephalic atraumatic sclera anicteric Lungs Coarse mechanical breath sounds; FIO2 40 and PEEP 5 Heart S1S2 no rub Abdomen soft nontender nondistended Extremities right AKA and no left leg edema Neuro - Sedation currently running. Does not follow commands Psych - in wrist restraints; does move arm occasionally  Access:  RUA AVG in use        OP HD: [facility] MWF    3h  70kg  F200 2/2.5 bath  350/600  RUA AVG  Hep 1000 then 500u/hr  - aranesp 35 ug q wk  - venofer 59m weekly   - hectorol 2.5 ug IV tiw  - last HD 12/23      CXR 12/27 - IMPRESSION:  New perihilar and left lower lobe airspace opacity suspicious for aspiration or edema.     Assessment/ Plan: Acute CVA / R ICA occlusion - s/p mech thrombectomy of R ICA 12/27 Ventilator dependent respiratory failure - on vent , per CCM. Abx per critical care   Hypotension - off of levo gtt.  On Midodrine.  Note has hx HTN on anti-hypertensives at home (seems to be a low dose regimen for heart failure) Fever/ ^wbc - poss PNA, on IV cefepime per CCM.  Abx per critical care. F/u CXR 12/30 showed improved infiltrates, no edema  Recent +COVID - on last admit; precautions per primary   ESRD - on HD per MWF schedule. Missed HD yesterday due to staffing.  HD on 1/4 as well to get back on schedule and in light of hyperkalemia.  I changed to nepro  H/o CM - LVEF 20-25% last echo Anemia ckd - has been on aranesp 40 mcg weekly on wednesdays - increased to 100 mcg weekly for 1/4 dose  MBD ckd - renvela was held as phos was low previously - now normal. Started back hectorol with HD   Disposition - in ICU - continue inpatient monitoring   Recent Labs  Lab 09/18/21 1802 09/19/21 0244 09/20/21 0652 09/21/21 0520  K  --    < > 5.7* 6.2*  BUN  --    < > 71* 91*  CREATININE  --    < > 7.40* 8.21*  CALCIUM  --    < > 8.5* 8.7*  PHOS 3.0  --   --  3.9  HGB  --    < > 7.6* 8.4*   < > = values in this interval not displayed.   Inpatient medications:  aspirin  325 mg Per Tube Daily   Or   aspirin  300 mg Rectal Daily   atorvastatin  80 mg Per Tube Daily   chlorhexidine gluconate (MEDLINE KIT)  15 mL Mouth Rinse BID   Chlorhexidine Gluconate Cloth  6 each Topical Daily   [START ON 09/22/2021] darbepoetin (ARANESP) injection - DIALYSIS  100 mcg Intravenous Q Wed-HD   doxercalciferol  2.5 mcg Intravenous Q M,W,F-HD   ezetimibe  10 mg Per Tube Daily   feeding supplement (PROSource TF)  45 mL Per Tube BID   heparin injection (subcutaneous)  5,000 Units Subcutaneous Q8H   insulin aspart  0-15 Units Subcutaneous Q4H   insulin aspart  4 Units Subcutaneous Q4H   insulin glargine-yfgn  15 Units Subcutaneous BID   mouth rinse  15 mL Mouth Rinse 10 times per day   midodrine  10 mg Per Tube TID WC   pantoprazole sodium  40 mg Per Tube QHS   polyethylene glycol  17 g Per Tube Daily    sodium chloride 10 mL/hr at 09/21/21 0800   ceFEPime (MAXIPIME) IV Stopped (09/20/21 1313)   dexmedetomidine (PRECEDEX) IV infusion 0.5 mcg/kg/hr (09/21/21 0800)   feeding supplement (NEPRO CARB STEADY) 1,000 mL (09/20/21 1009)   norepinephrine (LEVOPHED) Adult infusion Stopped (09/20/21 0946)    acetaminophen **OR** acetaminophen (TYLENOL) oral liquid 160 mg/5 mL **OR** acetaminophen, ondansetron (ZOFRAN) IV, senna-docusate    Claudia Desanctis, MD 09/21/2021 8:40 AM

## 2021-09-21 NOTE — Progress Notes (Signed)
NAME:  Jenna Brown, MRN:  [id number], DOB:  12/03/1950, LOS: 7 ADMISSION DATE:  09/14/2021, CONSULTATION DATE:  09/14/2021 REFERRING MD: Dr. Debbrah Alar CHIEF COMPLAINT: Acute R ICA stroke s/p thrombectomy  History of Present Illness:  71 year old woman who presented to [facility] 12/27 with acute R ICA stroke. S/p mechanical thrombectomy 12/28. PMHx significant for HTN, HLD, T2DM, CAD, PVD (s/p R AKA), ESRD on HD. Of note, recent admission for COVID-19 infection (treated with monotherapy as an outpatient); she then developed chest pain with concern for possible NSTEMI.  PCCM consulted for help with vent management post-thrombectomy.  Pertinent Medical History:    has a past medical history of Anemia, Anxiety, Arthritis, Cataract, CKD (chronic kidney disease), stage IV ([place]), Coronary artery disease (2016), Full dentures, GERD (gastroesophageal reflux disease), H/O hiatal hernia, Headache(784.0), Hemorrhoids, AKA (above knee amputation), right ([place]), Hyperlipidemia, Hypertension, Migraines, Myocardial infarction ([place]) (1990's), NSTEMI (non-ST elevated myocardial infarction) ([place]), Peripheral vascular disease ([place]), PONV (postoperative nausea and vomiting), Stroke ([place]), Type II diabetes mellitus ([place]), and Vertigo.   Significant Hospital Events: Including procedures, antibiotic start and stop dates in addition to other pertinent events   12/27 PCCM consult 12/28 CT head with small amount of subarachnoid hyperdensity in posterior right hemisphere, Contrast suspected. MRI CSP moderate to severe spinal canal stenosis MRI/A head Acute infarcts right MCA and PCA territory, most prominent in the right temporal lobe and right occipital lobe.  ECHO EF 25-30%, global hypokinesis, Grade I DD, RVSF normal 12/31 low blood pressure while on dialysis last night.  Was briefly on pressors.  Continues to fail pressure support weans due to desats. Started cefepime 1/3 Failing vent wean 2/2 desaturations. PEEP increased  to 8 (5). HD today per Nephro, longer session given K 6.2. CXR stable.  Interim History / Subjective:  No significant events overnight Seen by Nephro today, plan for HD K 6.2, therefore HD session length extended K. Ornithinolytica, amp-resistant Cefepime narrowed to ceftriaxone Repeat CXR stable  Objective:  Blood pressure (!) 108/57, pulse (!) 58, temperature 98.8 F (37.1 C), temperature source Axillary, resp. rate 16, height 5\' 4"  (1.626 m), weight 71.8 kg, SpO2 100 %.    Vent Mode: PRVC FiO2 (%):  [40 %] 40 % Set Rate:  [16 bmp] 16 bmp Vt Set:  [430 mL] 430 mL PEEP:  [5 cmH20] 5 cmH20 Pressure Support:  [5 cmH20] 5 cmH20 Plateau Pressure:  [16 cmH20-20 cmH20] 18 cmH20   Intake/Output Summary (Last 24 hours) at 09/21/2021 0806 Last data filed at 09/21/2021 0700 Gross per 24 hour  Intake 1448.92 ml  Output --  Net 1448.92 ml    Filed Weights   09/15/21 0240 09/15/21 1415 09/15/21 1715  Weight: 74.2 kg 71.6 kg 71.8 kg   Physical Examination: General: Acutely ill-appearing middle-aged woman in NAD. HEENT: /AT, anicteric sclera, PERRL, dry mucous membranes. ETT/OGT in place Neuro: Sedated. Responds to verbal stimuli. Following commands intermittently. Withdraws to pain in BUE, minimal response in LLE, R AKA noted. +Cough and +Gag  CV: RRR, no m/g/r. PULM: Breathing even and unlabored on vent (PEEP 5, FiO2 40%). Lung fields coarse throughout with scattered rhonchi. GI: Soft, nontender, nondistended. Normoactive bowel sounds. Extremities: Trace LLE edema noted. Skin: Warm/dry, no rashes.  Resolved Hospital Problem List:    Assessment & Plan:  Acute ischemic stroke with occlusion of proximal right internal carotid S/p mechanical thrombectomy, (TICI 3), CT head 12/28 with small amount of subarachnoid hyperdensity in posterior right hemisphere, Contrast suspected. 12/28 MRI CSP  moderate to severe spinal canal stenosis MRI/A head acute infarcts right MCA and PCA territory,  most prominent in the right temporal lobe and right occipital lobe. - Management per Stroke/Neuro - SBP goal 120-140 - Continue ASA/statin - No AC at present per Neuro - Neuroprotective measures: HOB > 30 degrees, normoglycemia, normothermia, electrolytes WNL  Acute respiratory failure with hypoxia COVID-19 Positive- asymptomatic Intubated for mechanical thrombectomy, ABG 7.3/44/101/22 on 12/27 on 70% FiO2. COVID+ 1 month ago and on 12/27. Do not suspect active COVID infection contributing to resp failure. Continued issues with PS trials. Patient remains tachypnic with low tidal volumes on high PS. - Continue full vent support (4-8cc/kg IBW) - Wean FiO2 for O2 sat > 90% - Daily WUA/SBT - VAP bundle - Pulmonary hygiene - PAD protocol for sedation: Precedex for goal RASS 0 to -1 - Cefepime narrowed to ceftriaxone for HCAP/Klebsiella PNA  Intermittent hypotension - Continue midodrine - Levophed PRN   End-stage renal disease on hemodialysis (MWF) Hyponatremia - Nephrology following, appreciate recommendations - HD per Nephrology - Trend BMP - Replete electrolytes as indicated - Monitor I&Os - Avoid nephrotoxic agents as able  Severe ischemic cardiomyopathy HX HTN HX HLD 12/29 ECHO EF 25-30%, global hypokinesis, Grade I DD, RVSF normal. Remains off pressors. - Hold metoprolol in the setting of hypotensio - Continue ASA, statin, zetia - Cardiac monitoring  DM2 - Continue basal insulin, SSI   GERD - PPI  Anemia of chronic illness - Trend H&H - Likely in the setting of ESRD  Best Practice: (right click and "Reselect all SmartList Selections" daily)   Diet/type: tubefeeds DVT prophylaxis: SCD GI prophylaxis: PPI Lines: N/A Foley:  N/A Code Status:  full code Last date of multidisciplinary goals of care discussion [ per primary]  Critical care time: 40 minutes   The patient is critically ill with multiple organ system failure and requires high complexity decision  making for assessment and support, frequent evaluation and titration of therapies, advanced monitoring, review of radiographic studies and interpretation of complex data.   Critical Care Time devoted to patient care services, exclusive of separately billable procedures, described in this note is 40 minutes.   Lestine Mount, PA-C [facility] 09/21/21 8:07 AM  Please see [email or web address] for pager details.  From 7A-7P if no response, please call [phone] After hours, please call ELink [phone]

## 2021-09-22 ENCOUNTER — Inpatient Hospital Stay (HOSPITAL_COMMUNITY): Payer: Medicare Other

## 2021-09-22 DIAGNOSIS — N186 End stage renal disease: Secondary | ICD-10-CM

## 2021-09-22 DIAGNOSIS — I255 Ischemic cardiomyopathy: Secondary | ICD-10-CM

## 2021-09-22 DIAGNOSIS — I159 Secondary hypertension, unspecified: Secondary | ICD-10-CM

## 2021-09-22 DIAGNOSIS — E119 Type 2 diabetes mellitus without complications: Secondary | ICD-10-CM

## 2021-09-22 DIAGNOSIS — J9601 Acute respiratory failure with hypoxia: Secondary | ICD-10-CM | POA: Diagnosis not present

## 2021-09-22 DIAGNOSIS — I639 Cerebral infarction, unspecified: Secondary | ICD-10-CM | POA: Diagnosis not present

## 2021-09-22 LAB — URINALYSIS, ROUTINE W REFLEX MICROSCOPIC
Bilirubin Urine: NEGATIVE
Glucose, UA: NEGATIVE mg/dL
Ketones, ur: NEGATIVE mg/dL
Nitrite: NEGATIVE
Protein, ur: 100 mg/dL — AB
Specific Gravity, Urine: 1.02 (ref 1.005–1.030)
pH: 5 (ref 5.0–8.0)

## 2021-09-22 LAB — BASIC METABOLIC PANEL
Anion gap: 11 (ref 5–15)
BUN: 61 mg/dL — ABNORMAL HIGH (ref 8–23)
CO2: 24 mmol/L (ref 22–32)
Calcium: 8.7 mg/dL — ABNORMAL LOW (ref 8.9–10.3)
Chloride: 100 mmol/L (ref 98–111)
Creatinine, Ser: 5.4 mg/dL — ABNORMAL HIGH (ref 0.44–1.00)
GFR, Estimated: 8 mL/min — ABNORMAL LOW (ref >60)
Glucose, Bld: 220 mg/dL — ABNORMAL HIGH (ref 70–99)
Potassium: 5.3 mmol/L — ABNORMAL HIGH (ref 3.5–5.1)
Sodium: 135 mmol/L (ref 135–145)

## 2021-09-22 LAB — CBC WITH DIFFERENTIAL/PLATELET
Abs Immature Granulocytes: 0.84 10*3/uL — ABNORMAL HIGH (ref 0.00–0.07)
Basophils Absolute: 0.1 10*3/uL (ref 0.0–0.1)
Basophils Relative: 1 %
Eosinophils Absolute: 0.2 10*3/uL (ref 0.0–0.5)
Eosinophils Relative: 2 %
HCT: 27 % — ABNORMAL LOW (ref 36.0–46.0)
Hemoglobin: 7.9 g/dL — ABNORMAL LOW (ref 12.0–15.0)
Immature Granulocytes: 5 %
Lymphocytes Relative: 16 %
Lymphs Abs: 2.6 10*3/uL (ref 0.7–4.0)
MCH: 28.7 pg (ref 26.0–34.0)
MCHC: 29.3 g/dL — ABNORMAL LOW (ref 30.0–36.0)
MCV: 98.2 fL (ref 80.0–100.0)
Monocytes Absolute: 2.1 10*3/uL — ABNORMAL HIGH (ref 0.1–1.0)
Monocytes Relative: 13 %
Neutro Abs: 9.8 10*3/uL — ABNORMAL HIGH (ref 1.7–7.7)
Neutrophils Relative %: 63 %
Platelets: 203 10*3/uL (ref 150–400)
RBC: 2.75 MIL/uL — ABNORMAL LOW (ref 3.87–5.11)
RDW: 14.7 % (ref 11.5–15.5)
WBC: 15.6 10*3/uL — ABNORMAL HIGH (ref 4.0–10.5)
nRBC: 0.1 % (ref 0.0–0.2)

## 2021-09-22 LAB — GLUCOSE, CAPILLARY
Glucose-Capillary: 211 mg/dL — ABNORMAL HIGH (ref 70–99)
Glucose-Capillary: 210 mg/dL — ABNORMAL HIGH (ref 70–99)
Glucose-Capillary: 185 mg/dL — ABNORMAL HIGH (ref 70–99)
Glucose-Capillary: 202 mg/dL — ABNORMAL HIGH (ref 70–99)
Glucose-Capillary: 147 mg/dL — ABNORMAL HIGH (ref 70–99)

## 2021-09-22 LAB — PROCALCITONIN: Procalcitonin: 1.96 ng/mL

## 2021-09-22 MED ORDER — FENTANYL CITRATE PF 50 MCG/ML IJ SOSY
50.0000 ug | PREFILLED_SYRINGE | Freq: Once | INTRAMUSCULAR | Status: DC
  Filled 2021-09-22: qty 1

## 2021-09-22 MED ORDER — INSULIN GLARGINE-YFGN 100 UNIT/ML ~~LOC~~ SOLN
20.0000 [IU] | Freq: Two times a day (BID) | SUBCUTANEOUS | Status: DC
  Administered 2021-09-22 – 2021-09-23 (×4): 20 [IU] via SUBCUTANEOUS
  Filled 2021-09-22 (×6): qty 0.2

## 2021-09-22 NOTE — Progress Notes (Addendum)
STROKE TEAM PROGRESS NOTE   INTERVAL HISTORY Patient is seen in her room with her RN at the bedside. Will attempt to wean again today, per CCM she failed SBT yesterday.  Neurological exam is unchanged.  Vital signs are stable.  No family at the bedside  Vitals:   09/22/21 1000 09/22/21 1100 09/22/21 1125 09/22/21 1200  BP: (!) 131/111 116/61 116/61   Pulse: 98 76    Resp: 16 16    Temp:    98.9 F (37.2 C)  TempSrc:    Axillary  SpO2: 95% 98%    Weight:      Height:       CBC:  Recent Labs  Lab 09/21/21 0520 09/21/21 1753 09/22/21 0416  WBC 14.1*  --  15.6*  NEUTROABS  --   --  9.8*  HGB 8.4* 8.5* 7.9*  HCT 27.8* 25.0* 27.0*  MCV 97.9  --  98.2  PLT 253  --  951    Basic Metabolic Panel:  Recent Labs  Lab 09/18/21 0541 09/18/21 1802 09/19/21 0244 09/21/21 0520 09/21/21 1753 09/22/21 0416  NA 134*  --    < > 137 136 135  K 3.8  --    < > 6.2* 4.3 5.3*  CL 103  --    < > 101  --  100  CO2 25  --    < > 23  --  24  GLUCOSE 240*  --    < > 239*  --  220*  BUN 32*  --    < > 91*  --  61*  CREATININE 4.28*  --    < > 8.21*  --  5.40*  CALCIUM 7.6*  --    < > 8.7*  --  8.7*  MG 2.2 2.2  --   --   --   --   PHOS 2.4* 3.0  --  3.9  --   --    < > = values in this interval not displayed.    Lipid Panel:  No results for input(s): CHOL, TRIG, HDL, CHOLHDL, VLDL, LDLCALC in the last 168 hours.  HgbA1c:  No results for input(s): HGBA1C in the last 168 hours.  Urine Drug Screen:  No results for input(s): LABOPIA, COCAINSCRNUR, LABBENZ, AMPHETMU, THCU, LABBARB in the last 168 hours.   Alcohol Level  No results for input(s): ETH in the last 168 hours.  IMAGING past 24 hours No results found.  PHYSICAL EXAM  Temp:  [98.8 F (37.1 C)-100.3 F (37.9 C)] 98.9 F (37.2 C) (01/04 1200) Pulse Rate:  [58-98] 76 (01/04 1100) Resp:  [13-21] 16 (01/04 1100) BP: (116-165)/(57-111) 116/61 (01/04 1125) SpO2:  [94 %-100 %] 98 % (01/04 1100) FiO2 (%):  [40 %] 40 % (01/04  1125)  General - intubated on Precedex, in no acute distress  Cardiovascular - Regular rate and rhythm.  Neurological: Intubated, Precedex for sedation, awake and interactive.  Eyes are open and follows commands PERRL, Right gaze preference.  Does not blink to threat on the left.  Will move all extremities in response to commands with 5/5 strength in RUE and RLE, 2/5 strength in LUE and 3/5 strength in LLE.  ASSESSMENT/PLAN Jenna Brown is a 71 y.o. female with history of ESRD on IHD, NSTEMI, CAD, HTN, HLD, right AKA, stroke and T2DM presenting with left sided weakness, left facial droop and slurred speech.  She was brought to the ED.  TNK was not given as  she presented outside the window.  CT angiography revealed LVO in the proximal right ICA with perfusion study showing core infarct of 29cc and penumbra of 168cc in right MCA and PCA territories. Mechanical thrombectomy was performed with TICI 3 flow achieved.  Still intubated with fluctuations in BP. 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 Bilateral basal ganglia and left cerbellar chronic infarcts, chronic C1-C2 subluxation. ASPECTS 10.    CTA head & neck emergent LVO at proximal right ICA, isolated right M1 segment and fetal type right PCA, left vertebral artery occlusion, stenotic right vertebral artery, high grade mid basilar stenosis CT perfusion 29/197 Status post IR with TICI3 at right ICA.  MRI  acute infarcts in right MCA and PCA territory, punctate foci of restricted diffusion in bilateral forntal and parietal lobes as well as left occipital lobe MRA  resolution of right ICA occlusion  2D Echo EF 25-30%, no LV thrombus LDL 74 HgbA1c 5.5 UDS negative VTE prophylaxis -Heparin subcu aspirin 81 mg daily and Brilinta (ticagrelor) 90 mg bid prior to admission, now on aspirin 325 mg daily. No AC now given large  infarct size.  May consider [facility] later given cardiomyopathy with low EF. Therapy recommendations:  pending Disposition:  pending  Respiratory failure Patient remains intubated 09/16/21 no breathing effort on weaning trial 09/17/21 tachypnea with PSV mode Continue weaning tolerated Management per CCM  Elevated WBC  WBC 15. Possible aspiration pneumonia.  Cefepime managed by CCM.  Cardiomyopathy 07/2021 EF 20 to 25% This admission EF 25 to 30% Home meds including Imdur and Coreg Currently not on home meds due to hypotension Management per CCM No AC now given large infarct size.  May consider [facility] later given low EF and stroke.  Hx of hypertension Hypotension Home meds:  coreg 3.125 mg BID BP on the low end On levophed intermittently Long-term BP goal normotensive On midodrine 10 TID started today.  Hyperlipidemia Home meds:  atorvastatin 80 mg daily, resumed in hospital LDL 74, goal < 70  zetia 10 mg daily and atorvastatin 80mg  Continue statin at discharge  Diabetes Hemoglobin A1c 5.5, uncontrolled Hyperglycemia On insulin SSI CBG monitoring Close PCP follow-up for better DM control Diabetic consult.  Other Stroke Risk Factors Advanced Age >/= 60  Former cigarette smoker Hx stroke Coronary artery disease PVD  Other Active Problems ESRD on IHD - Appreciate nephrology recommendations  Right LE AKA Initial CT showed C1-C2 subluxation with anterior spinal cord compression, similar as prior.  MRI C-spine showed diffuse cervical spine spinal canal stenosis with cord flattening but no cord signal abnormality. Anemia due to ESRD, hemoglobin 9.9-> 8.8-> 7.6 Leukocytosis WBC 15-> 13.8  Hospital day # 8   Patient seen and examined by NP/APP with MD. MD to update note as needed.   Janine Ores, DNP, FNP-BC [facility] Pager: [phone]  I have personally obtained history,examined this patient, reviewed notes, independently viewed imaging studies,  participated in medical decision making and plan of care.ROS completed by me personally and pertinent positives fully documented  I have made any additions or clarifications directly to the above note. Agree with note above.  Patient remains intubated for ventilatory support for respiratory failure but neurologically is quite stable.  Hopefully she will be extubated soon.  Discussed with Dr. Freda Jackson critical care medicine. This patient is critically ill and at significant risk of  neurological worsening, death and care requires constant monitoring of vital signs, hemodynamics,respiratory and cardiac monitoring, extensive review of multiple databases, frequent neurological assessment, discussion with family, other specialists and medical decision making of high complexity.I have made any additions or clarifications directly to the above note.This critical care time does not reflect procedure time, or teaching time or supervisory time of PA/NP/Med Resident etc but could involve care discussion time.  I spent 30 minutes of neurocritical care time  in the care of  this patient.      Antony Contras, MD Medical Director [facility] Pager: [phone] 09/22/2021 4:38 PM  To contact Stroke Continuity provider, please refer to [email or web address] After hours, contact General Neurology

## 2021-09-22 NOTE — Progress Notes (Signed)
SLP Cancellation Note  Patient Details Name: Jenna Brown MRN: [id number] DOB: 09/05/1951   Cancelled treatment:     Pt remains orally intubated. Our service will sign off. Please reconsult when ready.  Jenna Brown, [name]       Jenna Brown Laurice 09/22/2021, 8:02 AM

## 2021-09-22 NOTE — Progress Notes (Signed)
Patient was transported to CT and back to 4R83 without complication.

## 2021-09-22 NOTE — Progress Notes (Addendum)
NAME:  Jenna Brown, MRN:  [id number], DOB:  February 22, 1951, LOS: 74 ADMISSION DATE:  09/14/2021, CONSULTATION DATE:  09/14/2021 REFERRING MD: Dr. Debbrah Alar CHIEF COMPLAINT: Acute R ICA stroke s/p thrombectomy  History of Present Illness:  71 year old woman who presented to [facility] 12/27 with acute R ICA stroke. S/p mechanical thrombectomy 12/28. PMHx significant for HTN, HLD, T2DM, CAD, PVD (s/p R AKA), ESRD on HD. Of note, recent admission for COVID-19 infection (treated with monotherapy as an outpatient); she then developed chest pain with concern for possible NSTEMI.  PCCM consulted for help with vent management post-thrombectomy.  Pertinent Medical History:    has a past medical history of Anemia, Anxiety, Arthritis, Cataract, CKD (chronic kidney disease), stage IV ([place]), Coronary artery disease (2016), Full dentures, GERD (gastroesophageal reflux disease), H/O hiatal hernia, Headache(784.0), Hemorrhoids, AKA (above knee amputation), right ([place]), Hyperlipidemia, Hypertension, Migraines, Myocardial infarction ([place]) (1990's), NSTEMI (non-ST elevated myocardial infarction) ([place]), Peripheral vascular disease ([place]), PONV (postoperative nausea and vomiting), Stroke ([place]), Type II diabetes mellitus ([place]), and Vertigo.   Significant Hospital Events: Including procedures, antibiotic start and stop dates in addition to other pertinent events   12/27 PCCM consult 12/28 CT head with small amount of subarachnoid hyperdensity in posterior right hemisphere, Contrast suspected. MRI CSP moderate to severe spinal canal stenosis MRI/A head Acute infarcts right MCA and PCA territory, most prominent in the right temporal lobe and right occipital lobe.  ECHO EF 25-30%, global hypokinesis, Grade I DD, RVSF normal 12/31 low blood pressure while on dialysis last night.  Was briefly on pressors.  Continues to fail pressure support weans due to desats. Started cefepime 1/3 Failing vent wean 2/2 desaturations. PEEP increased  to 8 (5). HD today per Nephro, longer session given K 6.2. CXR stable.  Interim History / Subjective:  Intubated on PRVC; failed SBT due to apnea On precedex 0.4 Received HD yesterday and toerated well  Objective:  Blood pressure 137/69, pulse 66, temperature 99.3 F (37.4 C), resp. rate 16, height 5\' 4"  (1.626 m), weight 71.8 kg, SpO2 99 %.    Vent Mode: PRVC FiO2 (%):  [40 %] 40 % Set Rate:  [16 bmp] 16 bmp Vt Set:  [430 mL] 430 mL PEEP:  [5 cmH20-8 cmH20] 8 cmH20 Pressure Support:  [12 cmH20] 12 cmH20 Plateau Pressure:  [20 cmH20-23 cmH20] 20 cmH20   Intake/Output Summary (Last 24 hours) at 09/22/2021 0710 Last data filed at 09/22/2021 0600 Gross per 24 hour  Intake 1433.57 ml  Output 0 ml  Net 1433.57 ml    Filed Weights   09/15/21 0240 09/15/21 1415 09/15/21 1715  Weight: 74.2 kg 71.6 kg 71.8 kg   Physical Examination: General:  critically ill appearing on mech vent HEENT: MM pink/moist; ETT in place Neuro: sedated w/ precedex; cough/gag reflex present; PERRL; moves RUE CV: s1s2, RRR, no m/r/g PULM:  dim clear BS bilaterally; on mech vent PRVC; bedside US showing small left pleural effusion GI: soft, bsx4 active  Extremities: warm/dry, RL BKA present; trace LLE edema Skin: no rashes or lesions   Resolved Hospital Problem List:    Assessment & Plan:  Acute ischemic stroke with occlusion of proximal right internal carotid S/p mechanical thrombectomy, (TICI 3), CT head 12/28 with small amount of subarachnoid hyperdensity in posterior right hemisphere, Contrast suspected. 12/28 MRI CSP moderate to severe spinal canal stenosis MRI/A head acute infarcts right MCA and PCA territory, most prominent in the right temporal lobe and right occipital lobe. P: -per stroke/neuro -SBP  goal 120-140 -continue ASA/statin -hold off on [facility] per neuro due to large ICA stroke -avoid fever; glucose goal <180  Acute respiratory failure with hypoxia COVID-19 Positive-  asymptomatic KLEBSIELLA ORNITHINOLYTICA pneumonia Small LL pleural effusion Intubated for mechanical thrombectomy, ABG 7.3/44/101/22 on 12/27 on 70% FiO2. COVID+ 1 month ago and on 12/27. Do not suspect active COVID infection contributing to resp failure. Continued issues with PS trials. Patient remains tachypnic with low tidal volumes on high PS. P: -continue PRVC 6-8 cc/kg -wean fio2 for sats >92% -vap prevention in place -will stop precedex; repeat SBT later today -duoneb q6 -continue ceftriaxone for klebsiella pneumonia -consider thoracentesis on left pleural effusion and send pleural cultures  Intermittent hypotension P: -continue midodrine  End-stage renal disease on hemodialysis (MWF) Hyponatremia: improved Hyperkalemia P: -nephro following -continue HD -Trend BMP / urinary output -Replace electrolytes as indicated -Avoid nephrotoxic agents, ensure adequate renal perfusion  Severe ischemic cardiomyopathy HX HTN HX HLD 12/29 ECHO EF 25-30%, global hypokinesis, Grade I DD, RVSF normal. Remains off pressors. P: -BB on hold due to hypotension -ASA, statin, zetia -telemetry monitoring  DM2 P: -continue SSI -increase basal insulin -cbg monitoring  GERD P: - PPI  Anemia of chronic illness P: - Trend CBC - Likely in the setting of ESRD  Best Practice: (right click and "Reselect all SmartList Selections" daily)   Diet/type: tubefeeds DVT prophylaxis: SCD GI prophylaxis: PPI Lines: N/A Foley:  N/A Code Status:  full code Last date of multidisciplinary goals of care discussion [per primary; 1/4 updated daughter Joseph Art over phone; she would like to have family discussion with her Aunt possibly this weekend to discuss possible trach if unable to extubate and GOC]  Critical care time: 35 minutes     Mick Sell, PA-C  Pulmonary & Critical Care 09/22/21 7:10 AM  Please see [email or web address] for pager details.  From 7A-7P if no response, please call  [phone] After hours, please call ELink [phone]

## 2021-09-22 NOTE — Progress Notes (Signed)
[facility] Progress Note  Subjective: Last HD on 1/3 and she was kept even; UF was limited by hypotension. Spoke with critical care and they are doing a thoracentesis today. They are noting frothy secretions and they are ok with low dose levo to support UF.    Review of systems:   Unable to obtain 2/2 mechanical ventilation and sedation   Vitals:   09/22/21 0600 09/22/21 0700 09/22/21 0753 09/22/21 0800  BP: 137/69 129/65 129/65 116/62  Pulse: 66 63  60  Resp: '16 16  16  ' Temp:    100.3 F (37.9 C)  TempSrc:    Axillary  SpO2: 99% 98%  100%  Weight:      Height:        Exam:  General elderly female in bed intubated   HEENT normocephalic atraumatic sclera anicteric Lungs Coarse mechanical breath sounds; FIO2 40 and PEEP 8 Heart S1S2 no rub Abdomen soft nontender nondistended Extremities right AKA and no left leg edema Neuro - Sedation currently running. Does not follow commands Access:  RUA AVG with bruit      OP HD: [facility] M[facility]    3h  70kg  F200 2/2.5 bath  350/600  RUA AVG  Hep 1000 then 500u/hr  - aranesp 35 ug q wk  - venofer 15m weekly   - hectorol 2.5 ug IV tiw  - last HD 12/23      CXR 12/27 - IMPRESSION:  New perihilar and left lower lobe airspace opacity suspicious for aspiration or edema.     Assessment/ Plan: Acute CVA / R ICA occlusion - s/p mech thrombectomy of R ICA 12/27 Ventilator dependent respiratory failure - on vent , per CCM.    Hypotension - off of levo gtt but may need back.  On Midodrine.  Note has hx HTN on anti-hypertensives at home (seems to be a low dose regimen for heart failure)  Fever/ ^wbc - poss PNA, on IV cefepime per CCM.  Abx per critical care.  Recent +COVID - on last admit; precautions per primary  ESRD - HD on 1/4 as well to get back on schedule and in light of hyperkalemia.  OGasconadefor pressor to support fluid removal per pulm (ok with up to 5 mcg/min of levo).  I have changed to nepro  H/o CM - LVEF 20-25% last  echo Anemia ckd - has been on aranesp 40 mcg weekly on wednesdays - increased to 100 mcg weekly for 1/4 dose  MBD ckd - renvela was held as phos was low previously - now normal. Remain off renvela for now.  Started back hectorol with HD   Disposition - in ICU - continue inpatient monitoring   Recent Labs  Lab 09/18/21 1802 09/19/21 0244 09/21/21 0520 09/21/21 1753 09/22/21 0416  K  --    < > 6.2* 4.3 5.3*  BUN  --    < > 91*  --  61*  CREATININE  --    < > 8.21*  --  5.40*  CALCIUM  --    < > 8.7*  --  8.7*  PHOS 3.0  --  3.9  --   --   HGB  --    < > 8.4* 8.5* 7.9*   < > = values in this interval not displayed.   Inpatient medications:  aspirin  325 mg Per Tube Daily   Or   aspirin  300 mg Rectal Daily   atorvastatin  80 mg Per Tube  Daily   chlorhexidine gluconate (MEDLINE KIT)  15 mL Mouth Rinse BID   Chlorhexidine Gluconate Cloth  6 each Topical Q0600   darbepoetin (ARANESP) injection - DIALYSIS  100 mcg Intravenous Q Wed-HD   doxercalciferol  2.5 mcg Intravenous Q M,W,F-HD   ezetimibe  10 mg Per Tube Daily   feeding supplement (NEPRO CARB STEADY)  1,000 mL Per Tube Q24H   feeding supplement (PROSource TF)  45 mL Per Tube BID   heparin injection (subcutaneous)  5,000 Units Subcutaneous Q8H   insulin aspart  0-15 Units Subcutaneous Q4H   insulin aspart  4 Units Subcutaneous Q4H   insulin glargine-yfgn  20 Units Subcutaneous BID   ipratropium-albuterol  3 mL Nebulization Q6H   mouth rinse  15 mL Mouth Rinse 10 times per day   midodrine  10 mg Per Tube TID WC   pantoprazole sodium  40 mg Per Tube QHS   polyethylene glycol  17 g Per Tube Daily    sodium chloride Stopped (09/21/21 1234)   cefTRIAXone (ROCEPHIN)  IV Stopped (09/21/21 1304)   dexmedetomidine (PRECEDEX) IV infusion Stopped (09/22/21 0752)   acetaminophen **OR** acetaminophen (TYLENOL) oral liquid 160 mg/5 mL **OR** acetaminophen, ondansetron (ZOFRAN) IV, sennosides    Claudia Desanctis, MD 09/22/2021 10:29  AM

## 2021-09-23 ENCOUNTER — Inpatient Hospital Stay (HOSPITAL_COMMUNITY): Payer: Medicare Other

## 2021-09-23 DIAGNOSIS — I639 Cerebral infarction, unspecified: Secondary | ICD-10-CM | POA: Diagnosis not present

## 2021-09-23 DIAGNOSIS — N186 End stage renal disease: Secondary | ICD-10-CM | POA: Diagnosis not present

## 2021-09-23 DIAGNOSIS — I255 Ischemic cardiomyopathy: Secondary | ICD-10-CM | POA: Diagnosis present

## 2021-09-23 DIAGNOSIS — E119 Type 2 diabetes mellitus without complications: Secondary | ICD-10-CM | POA: Insufficient documentation

## 2021-09-23 DIAGNOSIS — I159 Secondary hypertension, unspecified: Secondary | ICD-10-CM | POA: Diagnosis not present

## 2021-09-23 DIAGNOSIS — J9601 Acute respiratory failure with hypoxia: Secondary | ICD-10-CM | POA: Diagnosis not present

## 2021-09-23 DIAGNOSIS — Z992 Dependence on renal dialysis: Secondary | ICD-10-CM

## 2021-09-23 LAB — CBC
HCT: 24.7 % — ABNORMAL LOW (ref 36.0–46.0)
Hemoglobin: 7.5 g/dL — ABNORMAL LOW (ref 12.0–15.0)
MCH: 30 pg (ref 26.0–34.0)
MCHC: 30.4 g/dL (ref 30.0–36.0)
MCV: 98.8 fL (ref 80.0–100.0)
Platelets: 194 10*3/uL (ref 150–400)
RBC: 2.5 MIL/uL — ABNORMAL LOW (ref 3.87–5.11)
RDW: 14.7 % (ref 11.5–15.5)
WBC: 19.1 10*3/uL — ABNORMAL HIGH (ref 4.0–10.5)
nRBC: 0.1 % (ref 0.0–0.2)

## 2021-09-23 LAB — GLUCOSE, CAPILLARY
Glucose-Capillary: 105 mg/dL — ABNORMAL HIGH (ref 70–99)
Glucose-Capillary: 111 mg/dL — ABNORMAL HIGH (ref 70–99)
Glucose-Capillary: 127 mg/dL — ABNORMAL HIGH (ref 70–99)
Glucose-Capillary: 134 mg/dL — ABNORMAL HIGH (ref 70–99)
Glucose-Capillary: 155 mg/dL — ABNORMAL HIGH (ref 70–99)
Glucose-Capillary: 256 mg/dL — ABNORMAL HIGH (ref 70–99)
Glucose-Capillary: 288 mg/dL — ABNORMAL HIGH (ref 70–99)

## 2021-09-23 LAB — BASIC METABOLIC PANEL
Anion gap: 10 (ref 5–15)
BUN: 55 mg/dL — ABNORMAL HIGH (ref 8–23)
CO2: 27 mmol/L (ref 22–32)
Calcium: 8.2 mg/dL — ABNORMAL LOW (ref 8.9–10.3)
Chloride: 98 mmol/L (ref 98–111)
Creatinine, Ser: 4.76 mg/dL — ABNORMAL HIGH (ref 0.44–1.00)
GFR, Estimated: 9 mL/min — ABNORMAL LOW (ref >60)
Glucose, Bld: 103 mg/dL — ABNORMAL HIGH (ref 70–99)
Potassium: 4.4 mmol/L (ref 3.5–5.1)
Sodium: 135 mmol/L (ref 135–145)

## 2021-09-23 LAB — CORTISOL: Cortisol, Plasma: 19.2 ug/dL

## 2021-09-23 MED ORDER — DARBEPOETIN ALFA 100 MCG/0.5ML IJ SOSY: 100.0000 ug | PREFILLED_SYRINGE | INTRAMUSCULAR | Status: DC

## 2021-09-23 MED ORDER — INSULIN ASPART 100 UNIT/ML IJ SOLN
2.0000 [IU] | INTRAMUSCULAR | Status: DC
  Administered 2021-09-23 – 2021-09-24 (×5): 2 [IU] via SUBCUTANEOUS

## 2021-09-23 MED ORDER — CHLORHEXIDINE GLUCONATE CLOTH 2 % EX PADS
6.0000 | MEDICATED_PAD | Freq: Every day | CUTANEOUS | Status: DC
  Administered 2021-09-24 – 2021-09-29 (×6): 6 via TOPICAL

## 2021-09-23 MED ORDER — DARBEPOETIN ALFA 100 MCG/0.5ML IJ SOSY: 100.0000 ug | PREFILLED_SYRINGE | Freq: Once | INTRAMUSCULAR | Status: DC

## 2021-09-23 MED ORDER — VANCOMYCIN HCL 750 MG/150ML IV SOLN
750.0000 mg | INTRAVENOUS | Status: DC
  Administered 2021-09-24: 750 mg via INTRAVENOUS
  Filled 2021-09-23 (×2): qty 150

## 2021-09-23 MED ORDER — VANCOMYCIN HCL 1750 MG/350ML IV SOLN
1750.0000 mg | Freq: Once | INTRAVENOUS | Status: AC
  Administered 2021-09-23: 1750 mg via INTRAVENOUS
  Filled 2021-09-23: qty 350

## 2021-09-23 MED ORDER — SODIUM CHLORIDE 3 % IN NEBU
4.0000 mL | INHALATION_SOLUTION | Freq: Four times a day (QID) | RESPIRATORY_TRACT | Status: AC
  Administered 2021-09-23 – 2021-09-26 (×12): 4 mL via RESPIRATORY_TRACT
  Filled 2021-09-23 (×11): qty 4

## 2021-09-23 MED ORDER — PIPERACILLIN-TAZOBACTAM IN DEX 2-0.25 GM/50ML IV SOLN
2.2500 g | Freq: Three times a day (TID) | INTRAVENOUS | Status: AC
  Administered 2021-09-23 – 2021-09-29 (×19): 2.25 g via INTRAVENOUS
  Filled 2021-09-23 (×20): qty 50

## 2021-09-23 MED ORDER — SODIUM CHLORIDE 3 % IN NEBU
INHALATION_SOLUTION | RESPIRATORY_TRACT | Status: AC
  Filled 2021-09-23: qty 4

## 2021-09-23 MED ORDER — DEXAMETHASONE SODIUM PHOSPHATE 4 MG/ML IJ SOLN
4.0000 mg | Freq: Four times a day (QID) | INTRAMUSCULAR | Status: AC
  Administered 2021-09-23 – 2021-09-24 (×3): 4 mg via INTRAVENOUS
  Filled 2021-09-23 (×3): qty 1

## 2021-09-23 MED ORDER — SODIUM CHLORIDE 0.9 % IV SOLN: 250.0000 mL | INTRAVENOUS | Status: DC

## 2021-09-23 MED ORDER — PHENYLEPHRINE HCL-NACL 20-0.9 MG/250ML-% IV SOLN
25.0000 ug/min | INTRAVENOUS | Status: DC
  Administered 2021-09-23: 50 ug/min via INTRAVENOUS
  Filled 2021-09-23: qty 250

## 2021-09-23 NOTE — Progress Notes (Signed)
Occupational Therapy Treatment Patient Details Name: Jenna Brown MRN: [id number] DOB: [birth year] Today's Date: 09/23/2021   History of present illness 71 yo female presents to [facility] on 12/27 with L hemiparesis, slurred speech. 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 Of note,"Patient has atlantodental instability with cord compression and recommend minimizing cervical manipulation especially neck extension; may benefit from neurosurgical evaluation". PMH includes recent hospitalization with covid and possible STEMI, end-stage renal disease on hemodialysis, NSTEMI, CAD, HTN, HLD, right AKA 2013, history of TIA/stroke, and type 2 diabetes mellitus.   OT comments  Patient seen in conjunction with PT this session.  RN good with repositioning and bed to chair position.  Patient with increased lethargy, but able to follow one step commands, participate with A/PROM to bilateral upper extremities and visual scanning.  OT to continue efforts in the acute setting, with SNF recommended for post acute rehab.  Patient noted with minimal AROM to LLE, but no real AROM noted to LUE>     Recommendations for follow up therapy are one component of a multi-disciplinary discharge planning process, led by the attending physician.  Recommendations may be updated based on patient status, additional functional criteria and insurance authorization.    Follow Up Recommendations  Skilled nursing-short term rehab (<3 hours/day)    Assistance Recommended at Discharge Frequent or constant Supervision/Assistance  Patient can return home with the following      Equipment Recommendations  BSC/3in1;Wheelchair (measurements OT);Wheelchair cushion (measurements OT);Hospital bed    Recommendations for Other Services      Precautions / Restrictions Precautions Precautions: Fall Precaution Comments: on vent, wrist  restraints Restrictions Weight Bearing Restrictions: No       Mobility Bed Mobility Overal bed mobility: Needs Assistance Bed Mobility: Rolling Rolling: Total assist;+2 for physical assistance         General bed mobility comments: totalA+2 to roll L/R for repositioning.    Transfers                   General transfer comment: placed in chair position     Balance Overall balance assessment: Needs assistance Sitting-balance support: No upper extremity supported Sitting balance-Leahy Scale: Poor Sitting balance - Comments: R lean in sitting in chair position Postural control: Right lateral lean                                 ADL either performed or assessed with clinical judgement   ADL Overall ADL's : Needs assistance/impaired     Grooming: Total assistance;Bed level                                      Extremity/Trunk Assessment Upper Extremity Assessment RUE Deficits / Details: AROM against gravity LUE Deficits / Details: no purposeful movement during session, flaccid and no response to tactile input   Lower Extremity Assessment Lower Extremity Assessment: Defer to PT evaluation   Cervical / Trunk Assessment Cervical / Trunk Assessment: Kyphotic    Vision       Perception Perception Perception: Not tested   Praxis Praxis Praxis: Not tested    Cognition Arousal/Alertness: Awake/alert Behavior During Therapy: Flat affect Overall Cognitive Status: Difficult to assess Area of Impairment: Following commands  Following Commands: Follows one step commands with increased time;Follows one step commands inconsistently       General Comments: reaching for ETT tube requiring redirection.  In and out of lethargy          Exercises   Shoulder Instructions       General Comments weaning from vent on CPAP settings with VSS    Pertinent Vitals/ Pain       Pain Assessment:  Faces Faces Pain Scale: No hurt Facial Expression: Relaxed, neutral Body Movements: Absence of movements Muscle Tension: Relaxed Compliance with ventilator (intubated pts.): Tolerating ventilator or movement Vocalization (extubated pts.): N/A CPOT Total: 0 Pain Intervention(s): Monitored during session                                                          Frequency  Min 2X/week        Progress Toward Goals  OT Goals(current goals can now be found in the care plan section)     Acute Rehab OT Goals OT Goal Formulation: Patient unable to participate in goal setting Time For Goal Achievement: 10/04/21 Potential to Achieve Goals: Fair  Plan      Co-evaluation    PT/OT/SLP Co-Evaluation/Treatment: Yes Reason for Co-Treatment: To address functional/ADL transfers;For patient/therapist safety PT goals addressed during session: Mobility/safety with mobility;Strengthening/ROM        AM-PAC OT "6 Clicks" Daily Activity     Outcome Measure   Help from another person eating meals?: Total Help from another person taking care of personal grooming?: Total Help from another person toileting, which includes using toliet, bedpan, or urinal?: Total Help from another person bathing (including washing, rinsing, drying)?: Total Help from another person to put on and taking off regular upper body clothing?: Total Help from another person to put on and taking off regular lower body clothing?: Total 6 Click Score: 6    End of Session    OT Visit Diagnosis: Muscle weakness (generalized) (M62.81)   Activity Tolerance Patient tolerated treatment well   Patient Left in bed;with call bell/phone within reach;with bed alarm set;with nursing/sitter in room;with SCD's reapplied;with restraints reapplied   Nurse Communication Mobility status;Precautions        Time: 6578-4696 OT Time Calculation (min): 23 min  Charges: OT General Charges $OT Visit: 1  Visit OT Treatments $Therapeutic Activity: 8-22 mins  09/23/2021  RP, OTR/L  Acute Rehabilitation Services  Office:  [phone]   Metta Clines 09/23/2021, 3:13 PM

## 2021-09-23 NOTE — Progress Notes (Signed)
NAME:  Jenna Brown, MRN:  [id number], DOB:  1951-01-23, LOS: 9 ADMISSION DATE:  09/14/2021, CONSULTATION DATE:  09/14/2021 REFERRING MD: Dr. Debbrah Alar CHIEF COMPLAINT: Acute R ICA stroke s/p thrombectomy  History of Present Illness:  71 year old woman who presented to [facility] 12/27 with acute R ICA stroke. S/p mechanical thrombectomy 12/28. PMHx significant for HTN, HLD, T2DM, CAD, PVD (s/p R AKA), ESRD on HD. Of note, recent admission for COVID-19 infection (treated with monotherapy as an outpatient); she then developed chest pain with concern for possible NSTEMI.  PCCM consulted for help with vent management post-thrombectomy.  Pertinent Medical History:    has a past medical history of Anemia, Anxiety, Arthritis, Cataract, CKD (chronic kidney disease), stage IV ([place]), Coronary artery disease (2016), Full dentures, GERD (gastroesophageal reflux disease), H/O hiatal hernia, Headache(784.0), Hemorrhoids, AKA (above knee amputation), right ([place]), Hyperlipidemia, Hypertension, Migraines, Myocardial infarction ([place]) (1990's), NSTEMI (non-ST elevated myocardial infarction) ([place]), Peripheral vascular disease ([place]), PONV (postoperative nausea and vomiting), Stroke ([place]), Type II diabetes mellitus ([place]), and Vertigo.   Significant Hospital Events: Including procedures, antibiotic start and stop dates in addition to other pertinent events   12/27 PCCM consult 12/28 CT head with small amount of subarachnoid hyperdensity in posterior right hemisphere, Contrast suspected. MRI CSP moderate to severe spinal canal stenosis MRI/A head Acute infarcts right MCA and PCA territory, most prominent in the right temporal lobe and right occipital lobe.  ECHO EF 25-30%, global hypokinesis, Grade I DD, RVSF normal 12/31 low blood pressure while on dialysis last night.  Was briefly on pressors.  Continues to fail pressure support weans due to desats. Started cefepime 1/3 Failing vent wean 2/2 desaturations. PEEP increased  to 8 (5). HD today per Nephro, longer session given K 6.2. CXR stable. 1/4: received HD; required Neo due to hypotension  Interim History / Subjective:  Remains intubated on PRVC; failed SBT due to RR 40s On precedex 0.4; follows some commands; strong cough/gag Lots of oral secretions Received HD overnight and required neo due to hypotension  Objective:  Blood pressure (!) 118/54, pulse 67, temperature 99.6 F (37.6 C), temperature source Axillary, resp. rate 17, height 5\' 4"  (1.626 m), weight 76.6 kg, SpO2 99 %.    Vent Mode: PRVC FiO2 (%):  [40 %] 40 % Set Rate:  [16 bmp] 16 bmp Vt Set:  [430 mL] 430 mL PEEP:  [8 cmH20-10 cmH20] 8 cmH20 Pressure Support:  [8 cmH20] 8 cmH20 Plateau Pressure:  [12 cmH20-26 cmH20] 20 cmH20   Intake/Output Summary (Last 24 hours) at 09/23/2021 0709 Last data filed at 09/23/2021 0600 Gross per 24 hour  Intake 1781.5 ml  Output 935 ml  Net 846.5 ml    Filed Weights   09/15/21 1715 09/23/21 0013 09/23/21 0317  Weight: 71.8 kg 77.3 kg 76.6 kg   Physical Examination: General:  critically ill appearing on mech vent HEENT: MM pink/moist; ETT in place Neuro: sedated w/ precedex; cough/gag reflex present; PERRL; moves RUE and LE CV: s1s2, RRR, no m/r/g PULM:  dim rhonchi BS bilaterally; on mech vent PRVC GI: soft, bsx4 active  Extremities: warm/dry, RL BKA present; trace LLE edema Skin: no rashes or lesions   Resolved Hospital Problem List:    Assessment & Plan:  Acute ischemic stroke with occlusion of proximal right internal carotid S/p mechanical thrombectomy, (TICI 3), CT head 12/28 with small amount of subarachnoid hyperdensity in posterior right hemisphere, Contrast suspected. 12/28 MRI CSP moderate to severe spinal canal stenosis MRI/A head acute  infarcts right MCA and PCA territory, most prominent in the right temporal lobe and right occipital lobe. P: -per stroke/neuro -SBP goal 120-140 -continue ASA/statin -hold off on AC per neuro due  to large ICA stroke -avoid fever; glucose goal <180; HOB >30 degrees  Acute respiratory failure with hypoxia COVID-19 Positive- asymptomatic KLEBSIELLA ORNITHINOLYTICA pneumonia Small LL pleural effusion Intubated for mechanical thrombectomy, ABG 7.3/44/101/22 on 12/27 on 70% FiO2. COVID+ 1 month ago and on 12/27. Do not suspect active COVID infection contributing to resp failure. Continued issues with PS trials. Patient remains tachypnic with low tidal volumes on high PS. P: -continue PRVC 6-8 cc/kg -wean fio2 for sats >92% -vap prevention in place -wean precedex for RASS 0; repeat SBT later today -duoneb and hypertonic neb q6  -continue ceftriaxone for klebsiella pneumonia  Intermittent hypotension P: -continue midodrine -may need prn levo for HD  End-stage renal disease on hemodialysis (MWF) Hyponatremia: improved Hyperkalemia P: -nephro following -continue HD -Trend BMP / urinary output -Replace electrolytes as indicated -Avoid nephrotoxic agents, ensure adequate renal perfusion  Severe ischemic cardiomyopathy HX HTN HX HLD 12/29 ECHO EF 25-30%, global hypokinesis, Grade I DD, RVSF normal. Remains off pressors. P: -BB on hold due to hypotension -ASA, statin, zetia -telemetry monitoring  DM2 P: -continue SSI and basal insulin -cbg monitoring  GERD P: - PPI  Anemia of chronic illness P: - Trend CBC - Likely in the setting of ESRD  Best Practice: (right click and "Reselect all SmartList Selections" daily)   Diet/type: tubefeeds DVT prophylaxis: SCD GI prophylaxis: PPI Lines: N/A Foley:  N/A Code Status:  full code Last date of multidisciplinary goals of care discussion [per primary; 1/4 updated daughter Joseph Art over phone; she would like to have family discussion with her Aunt possibly this weekend to discuss possible trach if unable to extubate and GOC]  Critical care time: 35 minutes     Mick Sell, PA-C [facility] 09/23/21 7:09 AM  Please see [email or web address] for pager details.  From 7A-7P if no response, please call [phone] After hours, please call ELink [phone]

## 2021-09-23 NOTE — Progress Notes (Signed)
Pharmacy Antibiotic Note  Jenna Brown is a 71 y.o. female admitted on 09/14/2021 with left-sided weakness s/p mechanical thrombectomy.  Patient grew Klebsiella in tracheal aspirate culture and antibiotic was narrowed from cefepime to Rocephin.  Now with fever and worsening leukocytosis in setting of copious sections, Pharmacy has been consulted to broaden antibiotic to vancomycin and Zosyn.   Patient has ESRD on HD (required pressor for 1/4 session).  Tmax 101.1, WBC 19, PCT 2.  Plan: Vanc 1750mg  IV x 1, then 750mg  IV qHD MWF Zosyn 2.25gm IV Q8H Monitor HD schedule/tolerance, clinical progress, micro data, vanc level as indicated  Height: 5\' 4"  (162.6 cm) Weight: 76.6 kg (168 lb 14 oz) IBW/kg (Calculated) : 54.7  Temp (24hrs), Avg:99.5 F (37.5 C), Min:98.7 F (37.1 C), Max:101.1 F (38.4 C)  Recent Labs  Lab 09/19/21 0244 09/20/21 0652 09/21/21 0520 09/22/21 0416 09/23/21 0436  WBC 15.4* 13.8* 14.1* 15.6* 19.1*  CREATININE 6.14* 7.40* 8.21* 5.40* 4.76*    Estimated Creatinine Clearance: 11.3 mL/min (A) (by C-G formula based on SCr of 4.76 mg/dL (H)).    Allergies  Allergen Reactions   Plavix [Clopidogrel Bisulfate] Itching   Codeine Other (See Comments)    "makes me feel strange"   Hydrocodone Other (See Comments)    Feels disorinated   Losartan Other (See Comments)   Tylenol With Codeine #3 [Acetaminophen-Codeine] Other (See Comments)    "doesn't make me feel right"   Cefepime 12/31>> 1/3 CTX 1/3 >> 1/5 Vanc 1/5 >> Zosyn 1/5 >>   12/27 Covid - positive (recently treated outpt) 12/27 MRSA PCR - negative 12/30 TA - Kleb ornithinolytica (r-amp) 1/5 TA -  1/5 BCx -   Marquett Bertoli D. [name], PharmD, BCPS, [place] 09/23/2021, 11:22 AM

## 2021-09-23 NOTE — Progress Notes (Signed)
Physical Therapy Treatment Patient Details Name: Jenna Brown MRN: [id number] DOB: [birth year] Today's Date: 09/23/2021   History of Present Illness 71 yo female presents to [facility] on 12/27 with L hemiparesis, slurred speech. 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 Of note,"Patient has atlantodental instability with cord compression and recommend minimizing cervical manipulation especially neck extension; may benefit from neurosurgical evaluation". PMH includes recent hospitalization with covid and possible STEMI, end-stage renal disease on hemodialysis, NSTEMI, CAD, HTN, HLD, right AKA 2013, history of TIA/stroke, and type 2 diabetes mellitus.    PT Comments    Patient currently weaning on vent. Patient following 1 step commands intermittently. Patient reaching for ETT during session requiring redirection. Patient able to perform AAROM on L LE for strengthening. Patient with intermittent times of wakefulness and times of lethargy. Continue to recommend SNF for ongoing Physical Therapy.       Recommendations for follow up therapy are one component of a multi-disciplinary discharge planning process, led by the attending physician.  Recommendations may be updated based on patient status, additional functional criteria and insurance authorization.  Follow Up Recommendations  Skilled nursing-short term rehab (<3 hours/day)     Assistance Recommended at Discharge Frequent or constant Supervision/Assistance  Patient can return home with the following Two people to help with walking and/or transfers;Two people to help with bathing/dressing/bathroom;Assistance with cooking/housework;Assistance with feeding;Assist for transportation;Direct supervision/assist for financial management;Direct supervision/assist for medications management;Help with stairs or ramp for entrance   Equipment Recommendations   Hospital bed    Recommendations for Other Services       Precautions / Restrictions Precautions Precautions: Fall Precaution Comments: on vent, wrist restraints Restrictions Weight Bearing Restrictions: No     Mobility  Bed Mobility Overal bed mobility: Needs Assistance Bed Mobility: Rolling Rolling: Total assist;+2 for physical assistance         General bed mobility comments: totalA+2 to roll L/R for repositioning.    Transfers                   General transfer comment: placed in chair position    Ambulation/Gait                   Stairs             Wheelchair Mobility    Modified Rankin (Stroke Patients Only)       Balance Overall balance assessment: Needs assistance Sitting-balance support: No upper extremity supported Sitting balance-Leahy Scale: Poor Sitting balance - Comments: R lean in sitting in chair position Postural control: Right lateral lean                                  Cognition Arousal/Alertness: Awake/alert Behavior During Therapy: Flat affect Overall Cognitive Status: Difficult to assess Area of Impairment: Following commands                       Following Commands: Follows one step commands with increased time;Follows one step commands inconsistently       General Comments: reaching for ETT tube requiring redirection. Patient with surprised look when told it was "January 2023". Following 1 step commands intermittently. In and out of lethargy        Exercises General Exercises - Lower Extremity Ankle Circles/Pumps: AAROM;Left;10 reps;Supine Heel Slides: AAROM;Left;10 reps;Supine    General Comments  General comments (skin integrity, edema, etc.): weaning from vent on CPAP settings with VSS      Pertinent Vitals/Pain Pain Assessment: Faces Faces Pain Scale: No hurt Facial Expression: Relaxed, neutral Body Movements: Absence of movements Muscle Tension: Relaxed Compliance  with ventilator (intubated pts.): Tolerating ventilator or movement Vocalization (extubated pts.): N/A CPOT Total: 0 Pain Intervention(s): Monitored during session    Home Living                          Prior Function            PT Goals (current goals can now be found in the care plan section) Acute Rehab PT Goals Patient Stated Goal: unable to state PT Goal Formulation: Patient unable to participate in goal setting Time For Goal Achievement: 10/05/21 Potential to Achieve Goals: Fair Progress towards PT goals: Progressing toward goals    Frequency    Min 3X/week      PT Plan Current plan remains appropriate    Co-evaluation PT/OT/SLP Co-Evaluation/Treatment: Yes Reason for Co-Treatment: To address functional/ADL transfers;For patient/therapist safety PT goals addressed during session: Mobility/safety with mobility;Strengthening/ROM        AM-PAC PT "6 Clicks" Mobility   Outcome Measure  Help needed turning from your back to your side while in a flat bed without using bedrails?: Total Help needed moving from lying on your back to sitting on the side of a flat bed without using bedrails?: Total Help needed moving to and from a bed to a chair (including a wheelchair)?: Total Help needed standing up from a chair using your arms (e.g., wheelchair or bedside chair)?: Total Help needed to walk in hospital room?: Total Help needed climbing 3-5 steps with a railing? : Total 6 Click Score: 6    End of Session Equipment Utilized During Treatment: Oxygen Activity Tolerance: Patient tolerated treatment well Patient left: in bed;with call bell/phone within reach;with restraints reapplied Nurse Communication: Mobility status PT Visit Diagnosis: Hemiplegia and hemiparesis Hemiplegia - Right/Left: Left Hemiplegia - dominant/non-dominant: Non-dominant Hemiplegia - caused by: Cerebral infarction     Time: 1352-1415 PT Time Calculation (min) (ACUTE ONLY): 23  min  Charges:  $Therapeutic Exercise: 8-22 mins                     Smita Lesh A. Gilford Rile PT, DPT Acute Rehabilitation Services Pager [phone] Office [phone]    Linna Hoff 09/23/2021, 2:57 PM

## 2021-09-23 NOTE — Progress Notes (Addendum)
STROKE TEAM PROGRESS NOTE   INTERVAL HISTORY Patient is seen in her room with her RN at the bedside. Failed SBT 1/3 due to RR 40s.  Neurological exam is unchanged.  Vital signs are currently stable. Had an episode of hypotension last night 82/44 (MAP 56). No family at the bedside  Vitals:   09/23/21 1144 09/23/21 1200 09/23/21 1300 09/23/21 1430  BP:  106/63 121/66   Pulse: 73 74 72   Resp: (!) 23 (!) 21 (!) 21   Temp:  99.6 F (37.6 C)    TempSrc:  Axillary    SpO2: 100% 100% 99% 100%  Weight:      Height:       CBC:  Recent Labs  Lab 09/22/21 0416 09/23/21 0436  WBC 15.6* 19.1*  NEUTROABS 9.8*  --   HGB 7.9* 7.5*  HCT 27.0* 24.7*  MCV 98.2 98.8  PLT 203 678    Basic Metabolic Panel:  Recent Labs  Lab 09/18/21 0541 09/18/21 1802 09/19/21 0244 09/21/21 0520 09/21/21 1753 09/22/21 0416 09/23/21 0436  NA 134*  --    < > 137   < > 135 135  K 3.8  --    < > 6.2*   < > 5.3* 4.4  CL 103  --    < > 101  --  100 98  CO2 25  --    < > 23  --  24 27  GLUCOSE 240*  --    < > 239*  --  220* 103*  BUN 32*  --    < > 91*  --  61* 55*  CREATININE 4.28*  --    < > 8.21*  --  5.40* 4.76*  CALCIUM 7.6*  --    < > 8.7*  --  8.7* 8.2*  MG 2.2 2.2  --   --   --   --   --   PHOS 2.4* 3.0  --  3.9  --   --   --    < > = values in this interval not displayed.    Lipid Panel:  No results for input(s): CHOL, TRIG, HDL, CHOLHDL, VLDL, LDLCALC in the last 168 hours.  HgbA1c:  No results for input(s): HGBA1C in the last 168 hours.  Urine Drug Screen:  No results for input(s): LABOPIA, COCAINSCRNUR, LABBENZ, AMPHETMU, THCU, LABBARB in the last 168 hours.   Alcohol Level  No results for input(s): ETH in the last 168 hours.  IMAGING past 24 hours DG Chest Port 1 View  Result Date: 09/23/2021 CLINICAL DATA:  Acute respiratory failure. EXAM: PORTABLE CHEST 1 VIEW COMPARISON:  CT chest from yesterday. Chest x-ray dated September 21, 2021. FINDINGS: Unchanged endotracheal and enteric  tubes. Stable cardiomediastinal silhouette with cardiomegaly. Unchanged small bilateral pleural effusions and bibasilar atelectasis, worse on the left. No pneumothorax. No acute osseous abnormality. IMPRESSION: 1. Unchanged small bilateral pleural effusions and bibasilar atelectasis. Electronically Signed   By: Titus Dubin M.D.   On: 09/23/2021 08:14    PHYSICAL EXAM  Temp:  [98.7 F (37.1 C)-101.1 F (38.4 C)] 99.6 F (37.6 C) (01/05 1200) Pulse Rate:  [60-123] 72 (01/05 1300) Resp:  [11-26] 21 (01/05 1300) BP: (77-159)/(42-145) 121/66 (01/05 1300) SpO2:  [94 %-100 %] 100 % (01/05 1430) FiO2 (%):  [40 %] 40 % (01/05 1430) Weight:  [76.6 kg-77.3 kg] 76.6 kg (01/05 0317)  General - intubated on Precedex, in no acute distress  Cardiovascular - Regular rate  and rhythm.  Neurological: Intubated, Precedex for sedation, awake and interactive.  Eyes are open and follows commands PERRL, Right gaze preference.  Does not blink to threat on the left.  Will move all extremities in response to commands with 5/5 strength in RUE and RLE, 2/5 strength in LUE and 3/5 strength in LLE.  ASSESSMENT/PLAN Jenna Brown is a 71 y.o. female with history of ESRD on IHD, NSTEMI, CAD, HTN, HLD, right AKA, stroke and T2DM presenting with left sided weakness, left facial droop and slurred speech.  She was brought to the ED.  TNK was not given as she presented outside the window.  CT angiography revealed LVO in the proximal right ICA with perfusion study showing core infarct of 29cc and penumbra of 168cc in right MCA and PCA territories. Mechanical thrombectomy was performed with TICI 3 flow achieved.  Still intubated with fluctuations in BP. Received overnight HD, required Neo due to hypotension. 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 Bilateral basal ganglia and left cerbellar chronic  infarcts, chronic C1-C2 subluxation. ASPECTS 10.    CTA head & neck emergent LVO at proximal right ICA, isolated right M1 segment and fetal type right PCA, left vertebral artery occlusion, stenotic right vertebral artery, high grade mid basilar stenosis CT perfusion 29/197 Status post IR with TICI3 at right ICA.  MRI  acute infarcts in right MCA and PCA territory, punctate foci of restricted diffusion in bilateral forntal and parietal lobes as well as left occipital lobe MRA  resolution of right ICA occlusion  2D Echo EF 25-30%, no LV thrombus LDL 74 HgbA1c 5.5 UDS negative VTE prophylaxis -Heparin subcu aspirin 81 mg daily and Brilinta (ticagrelor) 90 mg bid prior to admission, now on aspirin 325 mg daily. No AC now given large infarct size.  May consider [facility] later given cardiomyopathy with low EF. Therapy recommendations:  pending Disposition:  pending  Respiratory failure Patient remains intubated 09/16/21 no breathing effort on weaning trial 09/17/21 tachypnea with PSV mode Continue weaning tolerated Management per CCM  Elevated WBC  WBC 15. Possible aspiration pneumonia.  Cefepime managed by CCM.  Cardiomyopathy 07/2021 EF 20 to 25% This admission EF 25 to 30% Home meds including Imdur and Coreg Currently not on home meds due to hypotension Management per CCM No AC now given large infarct size.  May consider [facility] later given low EF and stroke.  Hx of hypertension Hypotension Home meds:  coreg 3.125 mg BID BP on the low end On levophed intermittently Long-term BP goal normotensive Continue midodrine 10 TID  Hyperlipidemia Home meds:  atorvastatin 80 mg daily, resumed in hospital LDL 74, goal < 70 Zetia 10 mg daily and atorvastatin 80mg  Continue statin at discharge  Diabetes Hemoglobin A1c 5.5, uncontrolled Hyperglycemia On insulin SSI CBG monitoring Close PCP follow-up for better DM control Diabetic consult.  Other Stroke Risk Factors Advanced Age >/= 44   Former cigarette smoker Hx stroke Coronary artery disease PVD  Other Active Problems ESRD on IHD - Appreciate nephrology recommendations  Right LE AKA Initial CT showed C1-C2 subluxation with anterior spinal cord compression, similar as prior.  MRI C-spine showed diffuse cervical spine spinal canal stenosis with cord flattening but no cord signal abnormality. Anemia due to ESRD, hemoglobin 8.5 > 7.9 > 7.5 Leukocytosis WBC 15.6 > 19.1  Hospital day # [address]  Lurline Hare, MD PGY1 Resident  STROKE MD NOTE : I have personally obtained history,examined this patient, reviewed notes, independently viewed imaging studies, participated in medical decision making and plan of care.ROS completed by me personally and pertinent positives fully documented  I have made any additions or clarifications directly to the above note. Agree with note above.  Patient failed trial of weaning off ventilatory support and remains intubated and on ventilatory support for respiratory failure.  Neurological exam is unchanged.  Continue ventilatory care as per critical care team and hopefully extubate in the next few days otherwise need to have discussion about tracheostomy with family.This patient is critically ill and at significant risk of neurological worsening, death and care requires constant monitoring of vital signs, hemodynamics,respiratory and cardiac monitoring, extensive review of multiple databases, frequent neurological assessment, discussion with family, other specialists and medical decision making of high complexity.I have made any additions or clarifications directly to the above note.This critical care time does not reflect procedure time, or teaching time or supervisory time of PA/NP/Med Resident etc but could involve care discussion time.  I spent 30 minutes of neurocritical care time  in the care of  this patient.      Antony Contras, MD Medical Director [facility] St[facility] Pager:  [phone] 09/23/2021 3:52 PM  To contact Stroke Continuity provider, please refer to [email or web address] After hours, contact General Neurology

## 2021-09-23 NOTE — Progress Notes (Signed)
[facility] Progress Note  Subjective: Last HD overnight on 1/4 and she had 735 mL UF; UF was limited by hypotension.  (Highest UF thus far with an HD session here recently though).  Required neo with HD and is off now.  She was restless earlier and was following commands per nursing.  It doesn't appear that she had a thoracentesis.    Review of systems:   Unable to obtain 2/2 mechanical ventilation and sedation She does deny air hunger by shaking head  Vitals:   09/23/21 0800 09/23/21 0805 09/23/21 0808 09/23/21 0821  BP:   130/77   Pulse:   98   Resp:   (!) 26   Temp: (!) 101.1 F (38.4 C)     TempSrc: Axillary     SpO2:  99% 99% 98%  Weight:      Height:        Exam:   General elderly female in bed intubated   HEENT normocephalic atraumatic sclera anicteric Lungs Coarse mechanical breath sounds; FIO2 40 and PEEP 8 Heart S1S2 no rub Abdomen soft nontender nondistended Extremities right AKA and no left leg edema Neuro - Sedation currently running. She does follow simple commands (I.e. "show me your thumb" Access:  RUA AVG with brui      OP HD: [facility] MWF    3h  70kg  F200 2/2.5 bath  350/600  RUA AVG  Hep 1000 then 500u/hr  - aranesp 35 ug q wk  - venofer 56m weekly   - hectorol 2.5 ug IV tiw  - last HD 12/23      CXR 12/27 - IMPRESSION:  New perihilar and left lower lobe airspace opacity suspicious for aspiration or edema.     Assessment/ Plan: Acute CVA / R ICA occlusion - s/p mech thrombectomy of R ICA 12/27 Ventilator dependent respiratory failure - on vent , per CCM.    Hypotension - off of pressors but required support with HD. On Midodrine 10 mg TID.  Note has hx HTN on anti-hypertensives at home (seems to be a low dose regimen for heart failure)  Fever/ ^wbc - possible PNA. On ceftriaxone. Abx per critical care.  Recent +COVID - on last admit; precautions per primary  ESRD - HD per MWF schedule. Ok for low dose of pressor to support fluid  removal per pulm (ok with up to 5 mcg/min of levo). Albumin with next HD as well  H/o CM - LVEF 20-25% last echo Anemia ckd - has been on aranesp 40 mcg weekly on wednesdays - increased to 100 mcg weekly for 1/4 dose which was missed.  Retime for today.  And may need additional increase  MBD ckd - renvela was held as phos was low previously - now normal. Remain off renvela for now.  Phos in am. Started back hectorol with HD   Disposition - in ICU - continue inpatient monitoring   Recent Labs  Lab 09/18/21 1802 09/19/21 0244 09/21/21 0520 09/21/21 1753 09/22/21 0416 09/23/21 0436  K  --    < > 6.2*   < > 5.3* 4.4  BUN  --    < > 91*  --  61* 55*  CREATININE  --    < > 8.21*  --  5.40* 4.76*  CALCIUM  --    < > 8.7*  --  8.7* 8.2*  PHOS 3.0  --  3.9  --   --   --   HGB  --    < >  8.4*   < > 7.9* 7.5*   < > = values in this interval not displayed.   Inpatient medications:  aspirin  325 mg Per Tube Daily   Or   aspirin  300 mg Rectal Daily   atorvastatin  80 mg Per Tube Daily   chlorhexidine gluconate (MEDLINE KIT)  15 mL Mouth Rinse BID   Chlorhexidine Gluconate Cloth  6 each Topical Q0600   darbepoetin (ARANESP) injection - DIALYSIS  100 mcg Intravenous Q Wed-HD   doxercalciferol  2.5 mcg Intravenous Q M,W,F-HD   ezetimibe  10 mg Per Tube Daily   feeding supplement (NEPRO CARB STEADY)  1,000 mL Per Tube Q24H   feeding supplement (PROSource TF)  45 mL Per Tube BID   fentaNYL (SUBLIMAZE) injection  50 mcg Intravenous Once   heparin injection (subcutaneous)  5,000 Units Subcutaneous Q8H   insulin aspart  0-15 Units Subcutaneous Q4H   insulin aspart  4 Units Subcutaneous Q4H   insulin glargine-yfgn  20 Units Subcutaneous BID   ipratropium-albuterol  3 mL Nebulization Q6H   mouth rinse  15 mL Mouth Rinse 10 times per day   midodrine  10 mg Per Tube TID WC   pantoprazole sodium  40 mg Per Tube QHS   polyethylene glycol  17 g Per Tube Daily   sodium chloride HYPERTONIC  4 mL  Nebulization Q6H   sodium chloride HYPERTONIC        sodium chloride Stopped (09/21/21 1234)   sodium chloride     cefTRIAXone (ROCEPHIN)  IV Stopped (09/22/21 1527)   dexmedetomidine (PRECEDEX) IV infusion 0.4 mcg/kg/hr (09/23/21 0600)   phenylephrine (NEO-SYNEPHRINE) Adult infusion Stopped (09/23/21 0332)   acetaminophen **OR** acetaminophen (TYLENOL) oral liquid 160 mg/5 mL **OR** acetaminophen, ondansetron (ZOFRAN) IV, sennosides    Claudia Desanctis, MD 09/23/2021 8:57 AM

## 2021-09-23 NOTE — Progress Notes (Signed)
[facility] Progress Note Patient Name: Jenna Brown DOB: July 10, 1951 MRN: [id number]   Date of Service  09/23/2021  HPI/Events of Note  Hypotension - BP = 82/44 with MAP = 56.  eICU Interventions  Plan: Phenylephrine IV infusion via PIV. Titrate to MAP >= 65.      Intervention Category Major Interventions: Hypotension - evaluation and management  Kinsleigh Ludolph Eugene 09/23/2021, 1:16 AM

## 2021-09-23 NOTE — Consult Note (Signed)
° °  [facility] CM Inpatient Consult   09/23/2021  Jenna Brown [birth year] [id number]  [facility] [ACO] Patient: Medicare   Primary Care Provider:  [name], MD, [facility] is listed for the [facility] follow up   Patient screened for length of stay [remains in ICU level of care]  less than 30 days readmission hospitalization with noted extreme high risk score for unplanned readmission risk.  Reviewed briefly of patient's medical record for progress, patient remains on vent at this time.  Plan:  Continue to follow progress and disposition to assess for post hospital care management needs, if appropriate.    For questions contact:   Natividad Brood, RN BSN [facility] Liaison  [phone] business mobile phone Toll free office [phone]  Fax number: [phone] [email or web address]

## 2021-09-24 ENCOUNTER — Inpatient Hospital Stay (HOSPITAL_COMMUNITY): Payer: Medicare Other

## 2021-09-24 DIAGNOSIS — I639 Cerebral infarction, unspecified: Secondary | ICD-10-CM | POA: Diagnosis not present

## 2021-09-24 LAB — BASIC METABOLIC PANEL
Anion gap: 15 (ref 5–15)
BUN: 78 mg/dL — ABNORMAL HIGH (ref 8–23)
CO2: 24 mmol/L (ref 22–32)
Calcium: 8.8 mg/dL — ABNORMAL LOW (ref 8.9–10.3)
Chloride: 96 mmol/L — ABNORMAL LOW (ref 98–111)
Creatinine, Ser: 5.79 mg/dL — ABNORMAL HIGH (ref 0.44–1.00)
GFR, Estimated: 7 mL/min — ABNORMAL LOW (ref >60)
Glucose, Bld: 292 mg/dL — ABNORMAL HIGH (ref 70–99)
Potassium: 5.5 mmol/L — ABNORMAL HIGH (ref 3.5–5.1)
Sodium: 135 mmol/L (ref 135–145)

## 2021-09-24 LAB — CBC
HCT: 27.4 % — ABNORMAL LOW (ref 36.0–46.0)
Hemoglobin: 8.1 g/dL — ABNORMAL LOW (ref 12.0–15.0)
MCH: 28.8 pg (ref 26.0–34.0)
MCHC: 29.6 g/dL — ABNORMAL LOW (ref 30.0–36.0)
MCV: 97.5 fL (ref 80.0–100.0)
Platelets: 213 10*3/uL (ref 150–400)
RBC: 2.81 MIL/uL — ABNORMAL LOW (ref 3.87–5.11)
RDW: 14.8 % (ref 11.5–15.5)
WBC: 21.4 10*3/uL — ABNORMAL HIGH (ref 4.0–10.5)
nRBC: 0 % (ref 0.0–0.2)

## 2021-09-24 LAB — GLUCOSE, CAPILLARY
Glucose-Capillary: 157 mg/dL — ABNORMAL HIGH (ref 70–99)
Glucose-Capillary: 204 mg/dL — ABNORMAL HIGH (ref 70–99)
Glucose-Capillary: 209 mg/dL — ABNORMAL HIGH (ref 70–99)
Glucose-Capillary: 241 mg/dL — ABNORMAL HIGH (ref 70–99)
Glucose-Capillary: 268 mg/dL — ABNORMAL HIGH (ref 70–99)
Glucose-Capillary: 291 mg/dL — ABNORMAL HIGH (ref 70–99)

## 2021-09-24 LAB — PHOSPHORUS: Phosphorus: 3.6 mg/dL (ref 2.5–4.6)

## 2021-09-24 MED ORDER — ALBUMIN HUMAN 25 % IV SOLN: 12.5000 g | Freq: Once | INTRAVENOUS | Status: AC

## 2021-09-24 MED ORDER — INSULIN GLARGINE-YFGN 100 UNIT/ML ~~LOC~~ SOLN
20.0000 [IU] | Freq: Two times a day (BID) | SUBCUTANEOUS | Status: DC
  Administered 2021-09-24 – 2021-09-30 (×12): 20 [IU] via SUBCUTANEOUS
  Filled 2021-09-24 (×13): qty 0.2

## 2021-09-24 MED ORDER — INSULIN GLARGINE-YFGN 100 UNIT/ML ~~LOC~~ SOLN
10.0000 [IU] | Freq: Once | SUBCUTANEOUS | Status: AC
  Administered 2021-09-24: 10 [IU] via SUBCUTANEOUS
  Filled 2021-09-24: qty 0.1

## 2021-09-24 MED ORDER — DARBEPOETIN ALFA 100 MCG/0.5ML IJ SOSY
100.0000 ug | PREFILLED_SYRINGE | INTRAMUSCULAR | Status: DC
  Administered 2021-09-24 – 2021-10-01 (×2): 100 ug via SUBCUTANEOUS
  Filled 2021-09-24 (×4): qty 0.5

## 2021-09-24 MED ORDER — INSULIN ASPART 100 UNIT/ML IJ SOLN
4.0000 [IU] | INTRAMUSCULAR | Status: DC
  Administered 2021-09-24 – 2021-09-28 (×19): 4 [IU] via SUBCUTANEOUS

## 2021-09-24 MED ORDER — NOREPINEPHRINE 4 MG/250ML-% IV SOLN: 2.0000 ug/min | INTRAVENOUS | Status: DC

## 2021-09-24 MED ORDER — ALBUMIN HUMAN 5 % IV SOLN
INTRAVENOUS | Status: AC
  Administered 2021-09-24: 25 g
  Filled 2021-09-24: qty 500

## 2021-09-24 NOTE — Progress Notes (Signed)
[facility] Progress Note  Subjective: Seen and examined on dialysis.  Blood pressure 108/50 and HR 71.  Tolerating goal and goal was increased by 0.5 kg.  R AVG in use.  Getting albumin.   Review of systems:   Unable to obtain 2/2 mechanical ventilation and sedation  Vitals:   09/24/21 0845 09/24/21 0900 09/24/21 0915 09/24/21 0930  BP: (!) 114/54 (!) 110/55 (!) 110/56 (!) 113/56  Pulse: 68 65 69 70  Resp: (!) 28 (!) 29 (!) 28 (!) 29  Temp:      TempSrc:      SpO2: 96%     Weight:      Height:        Exam:   General elderly female in bed intubated   HEENT normocephalic atraumatic sclera anicteric Lungs Coarse mechanical breath sounds; FIO2 40 and PEEP 5 Heart S1S2 no rub Abdomen soft nontender nondistended Extremities right AKA and no left leg edema Neuro - Sedation currently running. She does follow simple commands  Access:  RUA AVG in use      OP HD: [facility] MWF    3h  70kg  F200 2/2.5 bath  350/600  RUA AVG  Hep 1000 then 500u/hr  - aranesp 35 ug q wk  - venofer 37m weekly   - hectorol 2.5 ug IV tiw  - last HD 12/23      CXR 12/27 - IMPRESSION:  New perihilar and left lower lobe airspace opacity suspicious for aspiration or edema.     Assessment/ Plan: Acute CVA / R ICA occlusion - s/p mech thrombectomy of R ICA 12/27 Ventilator dependent respiratory failure - on vent , per CCM.    Hypotension - off of pressors but required support with HD. On Midodrine 10 mg TID.  Note has hx HTN on anti-hypertensives at home (seems to be a low dose regimen for heart failure)  Fever/ ^wbc - possible PNA. On ceftriaxone. Abx per critical care.  Recent +COVID - on last admit; precautions per primary  ESRD - HD per MWF schedule. Ok for low dose of pressor to support fluid removal per pulm (ok with up to 5 mcg/min of levo). Albumin with HD as well  H/o CM - LVEF 20-25% last echo Anemia ckd - has been on aranesp 40 mcg weekly on wednesdays - increased to 100 mcg weekly  for 1/4 dose which was missed again.  Retimed for today.  may need additional increase  MBD ckd - renvela was held as phos was low previously - now normal. Remain off renvela for now. Started back hectorol with HD   Disposition - in ICU - continue inpatient monitoring   Recent Labs  Lab 09/21/21 0520 09/21/21 1753 09/23/21 0436 09/24/21 0355  K 6.2*   < > 4.4 5.5*  BUN 91*   < > 55* 78*  CREATININE 8.21*   < > 4.76* 5.79*  CALCIUM 8.7*   < > 8.2* 8.8*  PHOS 3.9  --   --  3.6  HGB 8.4*   < > 7.5* 8.1*   < > = values in this interval not displayed.   Inpatient medications:  aspirin  325 mg Per Tube Daily   Or   aspirin  300 mg Rectal Daily   atorvastatin  80 mg Per Tube Daily   chlorhexidine gluconate (MEDLINE KIT)  15 mL Mouth Rinse BID   Chlorhexidine Gluconate Cloth  6 each Topical Q0600   darbepoetin (ARANESP) injection - NON-DIALYSIS  100 mcg Subcutaneous Q Fri-1800   doxercalciferol  2.5 mcg Intravenous Q M,W,F-HD   ezetimibe  10 mg Per Tube Daily   feeding supplement (NEPRO CARB STEADY)  1,000 mL Per Tube Q24H   feeding supplement (PROSource TF)  45 mL Per Tube BID   fentaNYL (SUBLIMAZE) injection  50 mcg Intravenous Once   heparin injection (subcutaneous)  5,000 Units Subcutaneous Q8H   insulin aspart  0-15 Units Subcutaneous Q4H   insulin aspart  4 Units Subcutaneous Q4H   insulin glargine-yfgn  10 Units Subcutaneous Once   ipratropium-albuterol  3 mL Nebulization Q6H   mouth rinse  15 mL Mouth Rinse 10 times per day   midodrine  10 mg Per Tube TID WC   pantoprazole sodium  40 mg Per Tube QHS   polyethylene glycol  17 g Per Tube Daily   sodium chloride HYPERTONIC  4 mL Nebulization Q6H    sodium chloride Stopped (09/21/21 1234)   sodium chloride     albumin human     dexmedetomidine (PRECEDEX) IV infusion 0.5 mcg/kg/hr (09/24/21 0800)   norepinephrine (LEVOPHED) Adult infusion     piperacillin-tazobactam (ZOSYN)  IV Stopped (09/24/21 0452)   vancomycin      acetaminophen **OR** acetaminophen (TYLENOL) oral liquid 160 mg/5 mL **OR** acetaminophen, ondansetron (ZOFRAN) IV, sennosides    Claudia Desanctis, MD 09/24/2021 9:43 AM

## 2021-09-24 NOTE — Procedures (Signed)
Cortrak  Person Inserting Tube:  Esaw Dace, RD Tube Type:  Cortrak - 43 inches Tube Size:  10 Tube Location:  Right nare Initial Placement:  Stomach Secured by: Bridle Technique Used to Measure Tube Placement:  Marking at nare/corner of mouth Cortrak Secured At:  53 cm Procedure Comments:  Cortrak Tube Team Note:  Consult received to place a Cortrak feeding tube.   X-ray is required, abdominal x-ray has been ordered by the Cortrak team. Please confirm tube placement before using the Cortrak tube.   If the tube becomes dislodged please keep the tube and contact the Cortrak team at [email or web address] ([email or web address]) for replacement.  If after hours and replacement cannot be delayed, place a NG tube and confirm placement with an abdominal x-ray.   Kerman Passey MS, RDN, LDN, CNSC Registered Dietitian III Clinical Nutrition RD Pager and On-Call Pager Number Located in [place]

## 2021-09-24 NOTE — Progress Notes (Signed)
PT Cancellation Note  Patient Details Name: Jenna Brown MRN: [id number] DOB: 02-27-1951   Cancelled Treatment:    Reason Eval/Treat Not Completed: Medical issues which prohibited therapy;Patient at procedure or test/unavailable Patient with soft BP this AM, currently receiving dialysis, and weaning on vent with potential extubation this PM. PT will hold at this time. Will check back at later date.   Waseem Suess A. Gilford Rile PT, DPT Acute Rehabilitation Services Pager [phone] Office [phone]    Jenna Brown 09/24/2021, 10:48 AM

## 2021-09-24 NOTE — Progress Notes (Signed)
Pt currently getting bedside dialysis. RT will hold CPT at this time.

## 2021-09-24 NOTE — Progress Notes (Signed)
STROKE TEAM PROGRESS NOTE   INTERVAL HISTORY Patient remains intubated for respiratory failure on ventilatory support.  Neurological exam is unchanged.  Vital signs are currently stable. . No family at the bedside  Vitals:   09/24/21 1500 09/24/21 1548 09/24/21 1600 09/24/21 1700  BP: 139/87 117/62 (!) 140/123 (!) 144/81  Pulse: (!) 107 (!) 107 95 87  Resp: (!) 26 (!) 33 20 16  Temp:   100.1 F (37.8 C)   TempSrc:   Axillary   SpO2: 100% 100% 95% 100%  Weight:      Height:       CBC:  Recent Labs  Lab 09/22/21 0416 09/23/21 0436 09/24/21 0355  WBC 15.6* 19.1* 21.4*  NEUTROABS 9.8*  --   --   HGB 7.9* 7.5* 8.1*  HCT 27.0* 24.7* 27.4*  MCV 98.2 98.8 97.5  PLT 203 194 267   Basic Metabolic Panel:  Recent Labs  Lab 09/18/21 0541 09/18/21 1802 09/19/21 0244 09/21/21 0520 09/21/21 1753 09/23/21 0436 09/24/21 0355  NA 134*  --    < > 137   < > 135 135  K 3.8  --    < > 6.2*   < > 4.4 5.5*  CL 103  --    < > 101   < > 98 96*  CO2 25  --    < > 23   < > 27 24  GLUCOSE 240*  --    < > 239*   < > 103* 292*  BUN 32*  --    < > 91*   < > 55* 78*  CREATININE 4.28*  --    < > 8.21*   < > 4.76* 5.79*  CALCIUM 7.6*  --    < > 8.7*   < > 8.2* 8.8*  MG 2.2 2.2  --   --   --   --   --   PHOS 2.4* 3.0  --  3.9  --   --  3.6   < > = values in this interval not displayed.   Lipid Panel:  No results for input(s): CHOL, TRIG, HDL, CHOLHDL, VLDL, LDLCALC in the last 168 hours.  HgbA1c:  No results for input(s): HGBA1C in the last 168 hours.  Urine Drug Screen:  No results for input(s): LABOPIA, COCAINSCRNUR, LABBENZ, AMPHETMU, THCU, LABBARB in the last 168 hours.   Alcohol Level  No results for input(s): ETH in the last 168 hours.  IMAGING past 24 hours DG Abd Portable 1V  Result Date: 09/24/2021 CLINICAL DATA:  Enteric catheter placement EXAM: PORTABLE ABDOMEN - 1 VIEW COMPARISON:  09/14/2021 FINDINGS: Frontal view of the abdomen and pelvis was obtained, excluding the lower  pelvis and right flank by collimation. Enteric catheter passes below diaphragm tip overlying the gastric antrum. Nonspecific gaseous distention of the bowel again noted. Evaluation limited by patient motion. IMPRESSION: 1. Enteric catheter tip projecting over gastric antrum. Electronically Signed   By: Randa Ngo M.D.   On: 09/24/2021 15:22    PHYSICAL EXAM  Temp:  [97.9 F (36.6 C)-100.1 F (37.8 C)] 100.1 F (37.8 C) (01/06 1600) Pulse Rate:  [38-107] 87 (01/06 1700) Resp:  [16-33] 16 (01/06 1700) BP: (84-152)/(48-123) 144/81 (01/06 1700) SpO2:  [95 %-100 %] 100 % (01/06 1700) FiO2 (%):  [40 %] 40 % (01/06 1548) Weight:  [75.2 kg-75.3 kg] 75.3 kg (01/06 0716)  General - intubated on Precedex, in no acute distress  Cardiovascular - Regular rate and  rhythm.  Neurological: Intubated, Precedex for sedation, awake and interactive.  Eyes are open and follows commands PERRL, Right gaze preference.  Does not blink to threat on the left.  Will move all extremities in response to commands with 5/5 strength in RUE and RLE, 2/5 strength in LUE and 3/5 strength in LLE.  ASSESSMENT/PLAN [name] is a 71 y.o. female with history of ESRD on IHD, NSTEMI, CAD, HTN, HLD, right AKA, stroke and T2DM presenting with left sided weakness, left facial droop and slurred speech.  She was brought to the ED.  TNK was not given as she presented outside the window.  CT angiography revealed LVO in the proximal right ICA with perfusion study showing core infarct of 29cc and penumbra of 168cc in right MCA and PCA territories. Mechanical thrombectomy was performed with TICI 3 flow achieved.  Still intubated with fluctuations in BP. Received overnight HD, required Neo due to hypotension. 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 Bilateral basal ganglia and left cerbellar chronic infarcts,  chronic C1-C2 subluxation. ASPECTS 10.    CTA head & neck emergent LVO at proximal right ICA, isolated right M1 segment and fetal type right PCA, left vertebral artery occlusion, stenotic right vertebral artery, high grade mid basilar stenosis CT perfusion 29/197 Status post IR with TICI3 at right ICA.  MRI  acute infarcts in right MCA and PCA territory, punctate foci of restricted diffusion in bilateral forntal and parietal lobes as well as left occipital lobe MRA  resolution of right ICA occlusion  2D Echo EF 25-30%, no LV thrombus LDL 74 HgbA1c 5.5 UDS negative VTE prophylaxis -Heparin subcu aspirin 81 mg daily and Brilinta (ticagrelor) 90 mg bid prior to admission, now on aspirin 325 mg daily. No AC now given large infarct size.  May consider [facility] later given cardiomyopathy with low EF. Therapy recommendations:  pending Disposition:  pending  Respiratory failure Patient remains intubated 09/16/21 no breathing effort on weaning trial 09/17/21 tachypnea with PSV mode Continue weaning tolerated Management per CCM  Elevated WBC  WBC 15. Possible aspiration pneumonia.  Cefepime managed by CCM.  Cardiomyopathy 07/2021 EF 20 to 25% This admission EF 25 to 30% Home meds including Imdur and Coreg Currently not on home meds due to hypotension Management per CCM No AC now given large infarct size.  May consider [facility] later given low EF and stroke.  Hx of hypertension Hypotension Home meds:  coreg 3.125 mg BID BP on the low end On levophed intermittently Long-term BP goal normotensive Continue midodrine 10 TID  Hyperlipidemia Home meds:  atorvastatin 80 mg daily, resumed in hospital LDL 74, goal < 70 Zetia 10 mg daily and atorvastatin 80mg  Continue statin at discharge  Diabetes Hemoglobin A1c 5.5, uncontrolled Hyperglycemia On insulin SSI CBG monitoring Close PCP follow-up for better DM control Diabetic consult.  Other Stroke Risk Factors Advanced Age >/= 17  Former  cigarette smoker Hx stroke Coronary artery disease PVD  Other Active Problems ESRD on IHD - Appreciate nephrology recommendations  Right LE AKA Initial CT showed C1-C2 subluxation with anterior spinal cord compression, similar as prior.  MRI C-spine showed diffuse cervical spine spinal canal stenosis with cord flattening but no cord signal abnormality. Anemia due to ESRD, hemoglobin 8.5 > 7.9 > 7.5 Leukocytosis WBC 15.6 > 19.1  Hospital day # 10    Patient  fremains intubated and on ventilatory support for respiratory failure.  Neurological exam is unchanged.  Continue ventilatory care as per critical care team and hopefully extubate in the next few days otherwise need to have discussion about tracheostomy with family.no family available at the bedside today.  Discussed with Dr. Vaughan Browner critical care medicine will need to discuss with family plan for one-way extubation versus reintubation and trach/PEG and nursing home prior to attempting preextubation. This patient is critically ill and at significant risk of neurological worsening, death and care requires constant monitoring of vital signs, hemodynamics,respiratory and cardiac monitoring, extensive review of multiple databases, frequent neurological assessment, discussion with family, other specialists and medical decision making of high complexity.I have made any additions or clarifications directly to the above note.This critical care time does not reflect procedure time, or teaching time or supervisory time of PA/NP/Med Resident etc but could involve care discussion time.  I spent 30 minutes of neurocritical care time  in the care of  this patient.      Antony Contras, MD Medical Director [facility] Pager: [phone] 09/24/2021 5:30 PM  To contact Stroke Continuity provider, please refer to [email or web address] After hours, contact General Neurology

## 2021-09-24 NOTE — Plan of Care (Signed)
°  Problem: Education: Goal: Knowledge of disease or condition will improve Outcome: Progressing Goal: Knowledge of secondary prevention will improve (SELECT ALL) Outcome: Progressing Goal: Knowledge of patient specific risk factors will improve (INDIVIDUALIZE FOR PATIENT) Outcome: Progressing Goal: Individualized Educational Video(s) Outcome: Progressing   Problem: Coping: Goal: Will verbalize positive feelings about self Outcome: Progressing Goal: Will identify appropriate support needs Outcome: Progressing   Problem: Health Behavior/Discharge Planning: Goal: Ability to manage health-related needs will improve Outcome: Progressing   Problem: Self-Care: Goal: Ability to participate in self-care as condition permits will improve Outcome: Progressing Goal: Verbalization of feelings and concerns over difficulty with self-care will improve Outcome: Progressing Goal: Ability to communicate needs accurately will improve Outcome: Progressing   Problem: Nutrition: Goal: Risk of aspiration will decrease Outcome: Progressing Goal: Dietary intake will improve Outcome: Progressing   Problem: Ischemic Stroke/TIA Tissue Perfusion: Goal: Complications of ischemic stroke/TIA will be minimized Outcome: Progressing   Problem: Safety: Goal: Non-violent Restraint(s) Outcome: Progressing

## 2021-09-24 NOTE — Progress Notes (Addendum)
NAME:  ABRAR KOONE, MRN:  [id number], DOB:  Oct 10, 1950, LOS: 16 ADMISSION DATE:  09/14/2021, CONSULTATION DATE:  09/14/2021 REFERRING MD: Dr. Debbrah Alar CHIEF COMPLAINT: Acute R ICA stroke s/p thrombectomy  History of Present Illness:  71 year old woman who presented to [facility] 12/27 with acute R ICA stroke. S/p mechanical thrombectomy 12/28. PMHx significant for HTN, HLD, T2DM, CAD, PVD (s/p R AKA), ESRD on HD. Of note, recent admission for COVID-19 infection (treated with monotherapy as an outpatient); she then developed chest pain with concern for possible NSTEMI.  PCCM consulted for help with vent management post-thrombectomy.  Pertinent Medical History:    has a past medical history of Anemia, Anxiety, Arthritis, Cataract, CKD (chronic kidney disease), stage IV ([place]), Coronary artery disease (2016), Full dentures, GERD (gastroesophageal reflux disease), H/O hiatal hernia, Headache(784.0), Hemorrhoids, AKA (above knee amputation), right ([place] ), Hyperlipidemia, Hypertension, Migraines, Myocardial infarction ([place]) (1990's), NSTEMI (non-ST elevated myocardial infarction) ([place]), Peripheral vascular disease ([place]), PONV (postoperative nausea and vomiting), Stroke ([place]), Type II diabetes mellitus ([place]), and Vertigo.   Significant Hospital Events: Including procedures, antibiotic start and stop dates in addition to other pertinent events   12/27 PCCM consult 12/28 CT head with small amount of subarachnoid hyperdensity in posterior right hemisphere, Contrast suspected. MRI CSP moderate to severe spinal canal stenosis MRI/A head Acute infarcts right MCA and PCA territory, most prominent in the right temporal lobe and right occipital lobe.  ECHO EF 25-30%, global hypokinesis, Grade I DD, RVSF normal 12/31 low blood pressure while on dialysis last night.  Was briefly on pressors.  Continues to fail pressure support weans due to desats. Started cefepime 1/3 Failing vent wean 2/2 desaturations. PEEP increased  to 8 (5). HD today per Nephro, longer session given K 6.2. CXR stable. 1/4: received HD; required Neo due to hypotension 1/5: wbc rising; broadened abx to vanc and zosyn  Interim History / Subjective:  Patient passed SBT yesterday but no cuff leak Gave Decadron yesterday Currently receiving HD and intubated on PRVC Will attempt to wean precedex and do SBT later this am Also WBC climbing and broadened ABX yesterday; bcx2 and trach culture sent  Objective:  Blood pressure (!) 146/75, pulse (!) 53, temperature 99.6 F (37.6 C), temperature source Oral, resp. rate 16, height 5\' 4"  (1.626 m), weight 75.2 kg, SpO2 100 %.    Vent Mode: PRVC FiO2 (%):  [40 %] 40 % Set Rate:  [16 bmp] 16 bmp Vt Set:  [430 mL] 430 mL PEEP:  [5 cmH20-8 cmH20] 5 cmH20 Pressure Support:  [5 cmH20-14 cmH20] 10 cmH20 Plateau Pressure:  [19 cmH20-27 cmH20] 23 cmH20   Intake/Output Summary (Last 24 hours) at 09/24/2021 0711 Last data filed at 09/24/2021 0600 Gross per 24 hour  Intake 1793.35 ml  Output 75 ml  Net 1718.35 ml    Filed Weights   09/23/21 0013 09/23/21 0317 09/24/21 0500  Weight: 77.3 kg 76.6 kg 75.2 kg   Physical Examination: General:  ill appearing on mech vent HEENT: MM pink/moist; ETT in place Neuro: sedated w/ precedex; cough/gag reflex present; PERRL moves RUE and LLE CV: s1s2, RRR, no m/r/g PULM:  dim rhonchi BS bilaterally; on mech vent PRVC GI: soft, bsx4 active  Extremities: warm/dry, RL BKA present; trace LLE edema Skin: no rashes or lesions   Resolved Hospital Problem List:    Assessment & Plan:  Acute ischemic stroke with occlusion of proximal right internal carotid S/p mechanical thrombectomy, (TICI 3), CT head 12/28 with small amount of  subarachnoid hyperdensity in posterior right hemisphere, Contrast suspected. 12/28 MRI CSP moderate to severe spinal canal stenosis MRI/A head acute infarcts right MCA and PCA territory, most prominent in the right temporal lobe and right  occipital lobe. P: -per stroke/neuro -SBP goal 120-140 -continue ASA/statin -hold off on AC per neuro due to large ICA stroke -avoid fever; glucose goal <180; HOB >30 degrees  Acute respiratory failure with hypoxia COVID-19 Positive- asymptomatic KLEBSIELLA ORNITHINOLYTICA pneumonia: 1/2 ceftriaxone<<1/5 broadened abx to zosyn and vanc Small LL pleural effusion Intubated for mechanical thrombectomy, ABG 7.3/44/101/22 on 12/27 on 70% FiO2. COVID+ 1 month ago and on 12/27. Do not suspect active COVID infection contributing to resp failure. Continued issues with PS trials. Patient remains tachypnic with low tidal volumes on high PS. P: -continue PRVC 6-8 cc/kg -wean fio2 for sats >92% -vap prevention in place -wean precedex for RASS 0 -patient passed sbt yesterday afternoon but had no cuff leak -decadron given and receiving HD this am -reattempt sbt this am and consider extubation today -duoneb and hypertonic neb q6  -broadened abx to vanc/zosyn; resp culture and bcx2 sent; follow cultures  Intermittent hypotension P: -continue midodrine -may need prn levo for HD  End-stage renal disease on hemodialysis (MWF) Hyponatremia: improved Hyperkalemia P: -nephro following -continue HD -Trend BMP / urinary output -Replace electrolytes as indicated -Avoid nephrotoxic agents, ensure adequate renal perfusion  Severe ischemic cardiomyopathy HX HTN HX HLD 12/29 ECHO EF 25-30%, global hypokinesis, Grade I DD, RVSF normal. Remains off pressors. P: -BB on hold due to hypotension -ASA, statin, zetia -telemetry monitoring  DM2 P: -continue SSI and basal insulin -increased TF coverage insulin; sugars elevated after receiving decadron -cbg monitoring  GERD P: - PPI  Anemia of chronic illness P: - Trend CBC - Likely in the setting of ESRD  Best Practice: (right click and "Reselect all SmartList Selections" daily)   Diet/type: tubefeeds DVT prophylaxis: SCD GI prophylaxis:  PPI Lines: N/A Foley:  N/A Code Status:  full code Last date of multidisciplinary goals of care discussion [per primary; 1/6 spoke with daughter over phone, states that she would be okay with trach if needed)  Critical care time: 35 minutes     Mick Sell, PA-C [facility] 09/24/21 7:11 AM  Please see [email or web address] for pager details.  From 7A-7P if no response, please call [phone] After hours, please call ELink [phone]

## 2021-09-24 NOTE — Progress Notes (Signed)
An USGPIV (ultrasound guided PIV) has been placed for short-term vasopressor infusion. A correctly placed ivWatch must be used when administering Vasopressors. Should this treatment be needed beyond 72 hours, central line access should be obtained.  It will be the responsibility of the bedside nurse to follow best practice to prevent extravasations.  IV start done on LFA but Vasopressor was not started at this time due to BP 108/50 mmHg. RN aware.

## 2021-09-25 DIAGNOSIS — I639 Cerebral infarction, unspecified: Secondary | ICD-10-CM | POA: Diagnosis not present

## 2021-09-25 DIAGNOSIS — D72829 Elevated white blood cell count, unspecified: Secondary | ICD-10-CM

## 2021-09-25 LAB — CBC
HCT: 24.6 % — ABNORMAL LOW (ref 36.0–46.0)
Hemoglobin: 7.6 g/dL — ABNORMAL LOW (ref 12.0–15.0)
MCH: 29.6 pg (ref 26.0–34.0)
MCHC: 30.9 g/dL (ref 30.0–36.0)
MCV: 95.7 fL (ref 80.0–100.0)
Platelets: 217 10*3/uL (ref 150–400)
RBC: 2.57 MIL/uL — ABNORMAL LOW (ref 3.87–5.11)
RDW: 15.3 % (ref 11.5–15.5)
WBC: 28.3 10*3/uL — ABNORMAL HIGH (ref 4.0–10.5)
nRBC: 0.2 % (ref 0.0–0.2)

## 2021-09-25 LAB — BASIC METABOLIC PANEL
Anion gap: 17 — ABNORMAL HIGH (ref 5–15)
BUN: 76 mg/dL — ABNORMAL HIGH (ref 8–23)
CO2: 21 mmol/L — ABNORMAL LOW (ref 22–32)
Calcium: 8.8 mg/dL — ABNORMAL LOW (ref 8.9–10.3)
Chloride: 95 mmol/L — ABNORMAL LOW (ref 98–111)
Creatinine, Ser: 4.96 mg/dL — ABNORMAL HIGH (ref 0.44–1.00)
GFR, Estimated: 9 mL/min — ABNORMAL LOW (ref >60)
Glucose, Bld: 288 mg/dL — ABNORMAL HIGH (ref 70–99)
Potassium: 5.1 mmol/L (ref 3.5–5.1)
Sodium: 133 mmol/L — ABNORMAL LOW (ref 135–145)

## 2021-09-25 LAB — GLUCOSE, CAPILLARY
Glucose-Capillary: 101 mg/dL — ABNORMAL HIGH (ref 70–99)
Glucose-Capillary: 122 mg/dL — ABNORMAL HIGH (ref 70–99)
Glucose-Capillary: 253 mg/dL — ABNORMAL HIGH (ref 70–99)
Glucose-Capillary: 269 mg/dL — ABNORMAL HIGH (ref 70–99)
Glucose-Capillary: 276 mg/dL — ABNORMAL HIGH (ref 70–99)
Glucose-Capillary: 91 mg/dL (ref 70–99)

## 2021-09-25 LAB — CULTURE, RESPIRATORY W GRAM STAIN: Culture: NORMAL

## 2021-09-25 MED ORDER — SODIUM ZIRCONIUM CYCLOSILICATE 10 G PO PACK
10.0000 g | PACK | Freq: Once | ORAL | Status: DC
  Filled 2021-09-25: qty 1

## 2021-09-25 MED ORDER — SODIUM ZIRCONIUM CYCLOSILICATE 10 G PO PACK
10.0000 g | PACK | Freq: Once | ORAL | Status: AC
  Administered 2021-09-25: 10 g
  Filled 2021-09-25: qty 1

## 2021-09-25 NOTE — Procedures (Signed)
Extubation Procedure Note  Patient Details:   Name: Jenna Brown DOB: 1951-05-10 MRN: [id number]   Airway Documentation:    Vent end date: 09/25/21 Vent end time: 1205   Evaluation  O2 sats: stable throughout Complications: No apparent complications Patient did tolerate procedure well. Bilateral Breath Sounds: Diminished, Rhonchi   Yes  Patient extubated per MD order. Positive cuff leak. Vitals are stable on 4L [place]. RN at bedside.  Herbie Saxon Jenna Brown 09/25/2021, 12:16 PM

## 2021-09-25 NOTE — Progress Notes (Signed)
STROKE TEAM PROGRESS NOTE   INTERVAL HISTORY No family at the bedside. She is awake alert and following commands. Still intubated for day 11 but seems plan to have her extubated today and if not tolerating will needs to be re-intubated and to consider trach.   Vitals:   09/25/21 1400 09/25/21 1500 09/25/21 1520 09/25/21 1600  BP: 121/76 109/60  120/62  Pulse: 63 67  77  Resp: (!) 29 (!) 25  (!) 34  Temp:    97.9 F (36.6 C)  TempSrc:    Axillary  SpO2: 97% 97% 97% 98%  Weight:      Height:       CBC:  Recent Labs  Lab 09/22/21 0416 09/23/21 0436 09/24/21 0355 09/25/21 0640  WBC 15.6*   < > 21.4* 28.3*  NEUTROABS 9.8*  --   --   --   HGB 7.9*   < > 8.1* 7.6*  HCT 27.0*   < > 27.4* 24.6*  MCV 98.2   < > 97.5 95.7  PLT 203   < > 213 217   < > = values in this interval not displayed.   Basic Metabolic Panel:  Recent Labs  Lab 09/18/21 1802 09/19/21 0244 09/21/21 0520 09/21/21 1753 09/24/21 0355 09/25/21 0437  NA  --    < > 137   < > 135 133*  K  --    < > 6.2*   < > 5.5* 5.1  CL  --    < > 101   < > 96* 95*  CO2  --    < > 23   < > 24 21*  GLUCOSE  --    < > 239*   < > 292* 288*  BUN  --    < > 91*   < > 78* 76*  CREATININE  --    < > 8.21*   < > 5.79* 4.96*  CALCIUM  --    < > 8.7*   < > 8.8* 8.8*  MG 2.2  --   --   --   --   --   PHOS 3.0  --  3.9  --  3.6  --    < > = values in this interval not displayed.   Lipid Panel:  No results for input(s): CHOL, TRIG, HDL, CHOLHDL, VLDL, LDLCALC in the last 168 hours.  HgbA1c:  No results for input(s): HGBA1C in the last 168 hours.  Urine Drug Screen:  No results for input(s): LABOPIA, COCAINSCRNUR, LABBENZ, AMPHETMU, THCU, LABBARB in the last 168 hours.   Alcohol Level  No results for input(s): ETH in the last 168 hours.  IMAGING past 24 hours No results found.  PHYSICAL EXAM  Temp:  [97.9 F (36.6 C)-100 F (37.8 C)] 97.9 F (36.6 C) (01/07 1600) Pulse Rate:  [43-105] 77 (01/07 1600) Resp:  [15-35] 34  (01/07 1600) BP: (98-155)/(48-136) 120/62 (01/07 1600) SpO2:  [94 %-100 %] 98 % (01/07 1600) FiO2 (%):  [40 %] 40 % (01/07 1103) Weight:  [75 kg] 75 kg (01/07 0500)  General - intubated off sedation, in no acute distress  Cardiovascular - Regular rate and rhythm.  Neurological: Intubated, off sedation, awake alert and interactive.  Eyes are open and follows simple commands. PERRL, tracking bilaterally, no significant gaze palsy but still has left hemianopia, not blinking to visual threat on the left.  Will move all extremities in response to commands with at least 4/5 strength in RUE  and RLE (right AKA chronic), 2/5 strength in LUE proximal and 3/5 bicep, and LLE 3/5 strength. Sensation and coordination not cooperative, and gait not tested.   ASSESSMENT/PLAN [name] is a 71 y.o. female with history of ESRD on IHD, NSTEMI, CAD, HTN, HLD, right AKA, stroke and T2DM presenting with left sided weakness, left facial droop and slurred speech.  She was brought to the ED.  TNK was not given as she presented outside the window.  CT angiography revealed LVO in the proximal right ICA with perfusion study showing core infarct of 29cc and penumbra of 168cc in right MCA and PCA territories. Mechanical thrombectomy was performed with TICI 3 flow achieved.  Still intubated with fluctuations in BP. Received overnight HD, required Neo due to hypotension.  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 Bilateral basal ganglia and left cerbellar chronic infarcts, chronic C1-C2 subluxation. ASPECTS 10.    CTA head & neck emergent LVO at proximal right ICA, isolated right M1 segment and fetal type right PCA, left vertebral artery occlusion, stenotic right vertebral artery, high grade mid basilar stenosis CT perfusion 29/197 Status post IR with TICI3 at right ICA.  MRI  acute infarcts in  right MCA and PCA territory, punctate foci of restricted diffusion in bilateral forntal and parietal lobes as well as left occipital lobe MRA  resolution of right ICA occlusion  2D Echo EF 25-30%, no LV thrombus LDL 74 HgbA1c 5.5 UDS negative VTE prophylaxis -Heparin subcu aspirin 81 mg daily and Brilinta (ticagrelor) 90 mg bid prior to admission, now on aspirin 325 mg daily. No AC now given large infarct size.  May consider [facility] later given cardiomyopathy with low EF. Therapy recommendations:  pending Disposition:  pending  Respiratory failure Patient remains intubated On weaning On Precedex Management per CCM Will attempt extubation today at day 11 If not tolerating, will need reintubation and consider tracheostomy  Elevated WBC Fever WBC 15.6 > 19.1->21.4->28.3 T-max 100.1 Possible aspiration pneumonia.  Cefepime -> vancomycin and Zosyn managed by CCM.  Cardiomyopathy 07/2021 EF 20 to 25% This admission EF 25 to 30% Home meds including Imdur and Coreg Currently not on home meds due to hypotension Management per CCM No AC now given large infarct size.  May consider [facility] later given low EF and stroke.  Hx of hypertension Hypotension Home meds:  Coreg 3.125 mg BID BP on the low end Off Levophed Long-term BP goal normotensive Continue midodrine 10 TID  Hyperlipidemia Home meds:  atorvastatin 80 mg daily, resumed in hospital LDL 74, goal < 70 Zetia 10 mg daily and atorvastatin 80mg  Continue statin at discharge  Diabetes Hemoglobin A1c 5.5, uncontrolled Hyperglycemia On insulin SSI CBG monitoring Close PCP follow-up for better DM control Diabetic consult.  Other Stroke Risk Factors Advanced Age >/= 65  Former cigarette smoker Hx stroke Coronary artery disease PVD  Other Active Problems ESRD on IHD - Appreciate nephrology recommendations  Right LE AKA Initial CT showed C1-C2 subluxation with anterior spinal cord compression, similar as prior.  MRI C-spine  showed diffuse cervical spine spinal canal stenosis with cord flattening but no cord signal abnormality. Anemia due to ESRD, hemoglobin 8.5 > 7.9 > 7.5->7.6  Hospital day # 11   This patient is critically ill due to large right MCA and PCA stroke, respiratory failure, ESRD on hemodialysis, CHF with low EF,  leukocytosis and fever and at significant risk of neurological worsening, death form recurrent stroke, hemorrhagic transformation, respiratory failure, sepsis, seizure. This patient's care requires constant monitoring of vital signs, hemodynamics, respiratory and cardiac monitoring, review of multiple databases, neurological assessment, discussion with family, other specialists and medical decision making of high complexity. I spent 35 minutes of neurocritical care time in the care of this patient.  I discussed with CCM Dr. Joneen Roach, MD PhD Stroke Neurology 09/25/2021 9:18 PM    To contact Stroke Continuity provider, please refer to [email or web address] After hours, contact General Neurology

## 2021-09-25 NOTE — Progress Notes (Addendum)
[facility] Progress Note  Subjective:  last HD on 1/6 with 2.1 kg UF.  She has done ok in interim.  She has been more alert - they are working toward extubation soon potentially even today.   Review of systems:    Unable to obtain 2/2 mechanical ventilation and sedation  Vitals:   09/25/21 0800 09/25/21 0827 09/25/21 0830 09/25/21 0900  BP: (!) 146/52  (!) 146/52 (!) 147/69  Pulse: (!) 45  82 (!) 58  Resp: 16  16 (!) 30  Temp: 100 F (37.8 C)     TempSrc: Oral     SpO2: 97% 99% 98% 99%  Weight:      Height:        Physical Exam:   General elderly female in bed intubated   HEENT normocephalic atraumatic sclera anicteric Lungs Coarse mechanical breath sounds; FIO2 40 and PEEP 5 Heart S1S2 no rub Abdomen soft nontender nondistended Extremities right AKA and no left leg edema; moving right arm - agitated - RN adjusted restraint Neuro - Sedation currently running. She does follow commands Access:  RUA AVG with bruit      OP HD: [facility] MWF    3h  70kg  F200 2/2.5 bath  350/600  RUA AVG  Hep 1000 then 500u/hr  - aranesp 35 ug q wk  - venofer 66m weekly   - hectorol 2.5 ug IV tiw  - last HD 12/23      CXR 12/27 - IMPRESSION:  New perihilar and left lower lobe airspace opacity suspicious for aspiration or edema.     Assessment/ Plan: Acute CVA / R ICA occlusion - s/p mech thrombectomy of R ICA 12/27 Ventilator dependent respiratory failure - on vent , per CCM.    Hypotension - off of pressors.  On Midodrine 10 mg TID.  Note has hx HTN on anti-hypertensives at home (seems to be a low dose regimen for heart failure)  Fever/ ^wbc - possible PNA. On ceftriaxone. Abx per critical care.  Recent +COVID - on last admit; precautions per primary  ESRD - HD per MWF schedule. Ok for low dose of pressor to support fluid removal per pulm (ok with up to 5 mcg/min of levo). Albumin with HD as well. On nepro feeds. Lokelma once.  H/o CM - LVEF 20-25% last echo Anemia ckd -  has been on aranesp 40 mcg weekly.  Dose was missed.  I increased to 100 mcg weekly for her 1/6 dose. Ordered every Friday.  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 Remain off renvela for now. Started back hectorol with HD. Phos in am.   Disposition - in ICU - continue inpatient monitoring   Recent Labs  Lab 09/21/21 0520 09/21/21 1753 09/24/21 0355 09/25/21 0437 09/25/21 0640  K 6.2*   < > 5.5* 5.1  --   BUN 91*   < > 78* 76*  --   CREATININE 8.21*   < > 5.79* 4.96*  --   CALCIUM 8.7*   < > 8.8* 8.8*  --   PHOS 3.9  --  3.6  --   --   HGB 8.4*   < > 8.1*  --  7.6*   < > = values in this interval not displayed.   Inpatient medications:  aspirin  325 mg Per Tube Daily   Or   aspirin  300 mg Rectal Daily   atorvastatin  80 mg Per Tube Daily  chlorhexidine gluconate (MEDLINE KIT)  15 mL Mouth Rinse BID   Chlorhexidine Gluconate Cloth  6 each Topical Q0600   darbepoetin (ARANESP) injection - NON-DIALYSIS  100 mcg Subcutaneous Q Fri-1800   doxercalciferol  2.5 mcg Intravenous Q M,W,F-HD   ezetimibe  10 mg Per Tube Daily   feeding supplement (NEPRO CARB STEADY)  1,000 mL Per Tube Q24H   feeding supplement (PROSource TF)  45 mL Per Tube BID   heparin injection (subcutaneous)  5,000 Units Subcutaneous Q8H   insulin aspart  0-15 Units Subcutaneous Q4H   insulin aspart  4 Units Subcutaneous Q4H   insulin glargine-yfgn  20 Units Subcutaneous BID   ipratropium-albuterol  3 mL Nebulization Q6H   mouth rinse  15 mL Mouth Rinse 10 times per day   midodrine  10 mg Per Tube TID WC   pantoprazole sodium  40 mg Per Tube QHS   polyethylene glycol  17 g Per Tube Daily   sodium chloride HYPERTONIC  4 mL Nebulization Q6H    sodium chloride Stopped (09/21/21 1234)   sodium chloride     dexmedetomidine (PRECEDEX) IV infusion 0.7 mcg/kg/hr (09/25/21 0900)   norepinephrine (LEVOPHED) Adult infusion     piperacillin-tazobactam (ZOSYN)  IV Stopped (09/25/21 0457)    vancomycin Stopped (09/24/21 1335)   acetaminophen **OR** acetaminophen (TYLENOL) oral liquid 160 mg/5 mL **OR** acetaminophen, ondansetron (ZOFRAN) IV, sennosides    Claudia Desanctis, MD 09/25/2021 10:59 AM    Leukocytosis increasing. No tunneled catheter.  Graft right arm. S/p vanc and zosyn.  Blood cultures NGTD. Discussed with pulm  Claudia Desanctis, MD 11:02 AM 09/25/2021

## 2021-09-25 NOTE — Progress Notes (Signed)
NAME:  Jenna Brown, MRN:  [id number], DOB:  06-05-1951, LOS: 63 ADMISSION DATE:  09/14/2021, CONSULTATION DATE:  09/14/2021 REFERRING MD: Dr. Debbrah Alar CHIEF COMPLAINT: Acute R ICA stroke s/p thrombectomy  History of Present Illness:  71 year old woman who presented to [facility] 12/27 with acute R ICA stroke. S/p mechanical thrombectomy 12/28. PMHx significant for HTN, HLD, T2DM, CAD, PVD (s/p R AKA), ESRD on HD. Of note, recent admission for COVID-19 infection (treated with monotherapy as an outpatient); she then developed chest pain with concern for possible NSTEMI.  PCCM consulted for help with vent management post-thrombectomy.  Pertinent Medical History:    has a past medical history of Anemia, Anxiety, Arthritis, Cataract, CKD (chronic kidney disease), stage IV ([place]), Coronary artery disease (2016), Full dentures, GERD (gastroesophageal reflux disease), H/O hiatal hernia, Headache(784.0), Hemorrhoids, AKA (above knee amputation), right ([place]), Hyperlipidemia, Hypertension, Migraines, Myocardial infarction ([place]) (1990's), NSTEMI (non-ST elevated myocardial infarction) ([place]), Peripheral vascular disease ([place]), PONV (postoperative nausea and vomiting), Stroke ([place]), Type II diabetes mellitus ([place]), and Vertigo.   Significant Hospital Events: Including procedures, antibiotic start and stop dates in addition to other pertinent events   12/27 PCCM consult 12/28 CT head with small amount of subarachnoid hyperdensity in posterior right hemisphere, Contrast suspected. MRI CSP moderate to severe spinal canal stenosis MRI/A head Acute infarcts right MCA and PCA territory, most prominent in the right temporal lobe and right occipital lobe.  ECHO EF 25-30%, global hypokinesis, Grade I DD, RVSF normal 12/31 low blood pressure while on dialysis last night.  Was briefly on pressors.  Continues to fail pressure support weans due to desats. Started cefepime 1/3 Failing vent wean 2/2 desaturations. PEEP increased  to 8 (5). HD today per Nephro, longer session given K 6.2. CXR stable. 1/4: received HD; required Neo due to hypotension 1/5: wbc rising; broadened abx to vanc and zosyn.  No cuff leak started on Decadron   Interim History / Subjective:   Continues to pass SBT's today.  On 5/5 Awake, interactive.  Does not appear to be in distress  Objective:  Blood pressure 115/66, pulse (!) 43, temperature 99.6 F (37.6 C), temperature source Axillary, resp. rate (!) 34, height 5\' 4"  (1.626 m), weight 75 kg, SpO2 96 %.    Vent Mode: PSV;CPAP FiO2 (%):  [40 %] 40 % Set Rate:  [16 bmp] 16 bmp Vt Set:  [430 mL] 430 mL PEEP:  [5 cmH20] 5 cmH20 Pressure Support:  [5 cmH20-10 cmH20] 5 cmH20 Plateau Pressure:  [18 cmH20-26 cmH20] 18 cmH20   Intake/Output Summary (Last 24 hours) at 09/25/2021 1328 Last data filed at 09/25/2021 1200 Gross per 24 hour  Intake 1490.01 ml  Output --  Net 1490.01 ml   Filed Weights   09/24/21 0500 09/24/21 0716 09/25/21 0500  Weight: 75.2 kg 75.3 kg 75 kg   Physical Examination: Gen:      No acute distress HEENT:  EOMI, sclera anicteric Neck:     No masses; no thyromegaly, ETT Lungs:    Clear to auscultation bilaterally; normal respiratory effort CV:         Regular rate and rhythm; no murmurs Abd:      + bowel sounds; soft, non-tender; no palpable masses, no distension Ext:    No edema; adequate peripheral perfusion Skin:      Warm and dry; no rash Neuro: Awake, responsive  Lab/imaging reviewed Significant for 133, potassium 5.1, WBC increased to 28.3 No new imaging  Resolved Hospital Problem List:  Hyponatremia: improved  Assessment & Plan:  Acute ischemic stroke with occlusion of proximal right internal carotid S/p mechanical thrombectomy, (TICI 3), CT head 12/28 with small amount of subarachnoid hyperdensity in posterior right hemisphere, Contrast suspected. 12/28 MRI CSP moderate to severe spinal canal stenosis MRI/A head acute infarcts right MCA and PCA  territory, most prominent in the right temporal lobe and right occipital lobe. P: Management per neurology Continue aspirin, statin No anticoagulation due to large nature of stroke  Acute respiratory failure with hypoxia COVID-19 Positive- asymptomatic KLEBSIELLA ORNITHINOLYTICA pneumonia: 1/2 ceftriaxone<<1/5 broadened abx to zosyn and vanc Small LL pleural effusion Intubated for mechanical thrombectomy, ABG 7.3/44/101/22 on 12/27 on 70% FiO2. COVID+ 1 month ago and on 12/27. Do not suspect active COVID infection contributing to resp failure. Continued issues with PS trials. Patient remains tachypnic with low tidal volumes on high PS. P: Weaning on pressure support.  She has cuff leak today and we will plan on extubation Antibiotics broadened to Vanco and Zosyn.  No growth on repeat cultures. WBC count is higher but that may be partly due to Decadron she received last week.  Continue to monitor closely  Intermittent hypotension P: Continue midodrine, as needed Levophed for hemodialysis next  End-stage renal disease on hemodialysis (MWF) Hyperkalemia P: Hemodialysis per nephrology  Severe ischemic cardiomyopathy HX HTN HX HLD 12/29 ECHO EF 25-30%, global hypokinesis, Grade I DD, RVSF normal. Remains off pressors. P: -BB on hold due to hypotension -ASA, statin, zetia -telemetry monitoring  DM2 P: -continue SSI and basal insulin -Continue tube feed coverage -cbg monitoring  GERD P: - PPI  Anemia of chronic illness P: - Trend CBC - Likely in the setting of ESRD  Best Practice: (right click and "Reselect all SmartList Selections" daily)   Diet/type: tubefeeds DVT prophylaxis: SCD GI prophylaxis: PPI Lines: N/A Foley:  N/A Code Status:  full code Last date of multidisciplinary goals of care discussion [per primary; 1/6 spoke with daughter over phone, states that she would be okay with trach if needed)  Critical care time:     The patient is critically ill with  multiple organ system failure and requires high complexity decision making for assessment and support, frequent evaluation and titration of therapies, advanced monitoring, review of radiographic studies and interpretation of complex data.   Critical Care Time devoted to patient care services, exclusive of separately billable procedures, described in this note is 45 minutes.   Marshell Garfinkel MD [facility] See Amion for pager  If no response to pager , please call [phone] until 7pm After 7:00 pm call Elink  [phone] 09/25/2021, 1:38 PM

## 2021-09-26 ENCOUNTER — Inpatient Hospital Stay (HOSPITAL_COMMUNITY): Payer: Medicare Other

## 2021-09-26 DIAGNOSIS — I639 Cerebral infarction, unspecified: Secondary | ICD-10-CM | POA: Diagnosis not present

## 2021-09-26 LAB — GLUCOSE, CAPILLARY
Glucose-Capillary: 96 mg/dL (ref 70–99)
Glucose-Capillary: 135 mg/dL — ABNORMAL HIGH (ref 70–99)
Glucose-Capillary: 125 mg/dL — ABNORMAL HIGH (ref 70–99)
Glucose-Capillary: 149 mg/dL — ABNORMAL HIGH (ref 70–99)
Glucose-Capillary: 153 mg/dL — ABNORMAL HIGH (ref 70–99)

## 2021-09-26 LAB — CBC
HCT: 27.3 % — ABNORMAL LOW (ref 36.0–46.0)
Hemoglobin: 8.3 g/dL — ABNORMAL LOW (ref 12.0–15.0)
MCH: 29.4 pg (ref 26.0–34.0)
MCHC: 30.4 g/dL (ref 30.0–36.0)
MCV: 96.8 fL (ref 80.0–100.0)
Platelets: 306 10*3/uL (ref 150–400)
RBC: 2.82 MIL/uL — ABNORMAL LOW (ref 3.87–5.11)
RDW: 15.5 % (ref 11.5–15.5)
WBC: 37.3 10*3/uL — ABNORMAL HIGH (ref 4.0–10.5)
nRBC: 0.9 % — ABNORMAL HIGH (ref 0.0–0.2)

## 2021-09-26 LAB — BASIC METABOLIC PANEL
Anion gap: 22 — ABNORMAL HIGH (ref 5–15)
BUN: 104 mg/dL — ABNORMAL HIGH (ref 8–23)
CO2: 21 mmol/L — ABNORMAL LOW (ref 22–32)
Calcium: 8.7 mg/dL — ABNORMAL LOW (ref 8.9–10.3)
Chloride: 94 mmol/L — ABNORMAL LOW (ref 98–111)
Creatinine, Ser: 6.54 mg/dL — ABNORMAL HIGH (ref 0.44–1.00)
GFR, Estimated: 6 mL/min — ABNORMAL LOW (ref >60)
Glucose, Bld: 100 mg/dL — ABNORMAL HIGH (ref 70–99)
Potassium: 3.8 mmol/L (ref 3.5–5.1)
Sodium: 137 mmol/L (ref 135–145)

## 2021-09-26 LAB — PHOSPHORUS: Phosphorus: 5.1 mg/dL — ABNORMAL HIGH (ref 2.5–4.6)

## 2021-09-26 MED ORDER — CHLORHEXIDINE GLUCONATE CLOTH 2 % EX PADS
6.0000 | MEDICATED_PAD | Freq: Every day | CUTANEOUS | Status: DC
  Administered 2021-09-27 – 2021-10-03 (×7): 6 via TOPICAL

## 2021-09-26 MED ORDER — SEVELAMER CARBONATE 800 MG PO TABS: 800.0000 mg | ORAL_TABLET | Freq: Three times a day (TID) | ORAL | Status: DC

## 2021-09-26 MED ORDER — GUAIFENESIN ER 600 MG PO TB12
600.0000 mg | ORAL_TABLET | Freq: Two times a day (BID) | ORAL | Status: DC | PRN
  Administered 2021-09-28: 600 mg via ORAL
  Filled 2021-09-26 (×2): qty 1

## 2021-09-26 MED ORDER — SEVELAMER CARBONATE 800 MG PO TABS
800.0000 mg | ORAL_TABLET | Freq: Three times a day (TID) | ORAL | Status: DC
  Administered 2021-09-26 – 2021-09-27 (×3): 800 mg
  Filled 2021-09-26 (×3): qty 1

## 2021-09-26 NOTE — Progress Notes (Signed)
[facility] Progress Note  Subjective:  last HD on 1/6 with 2.1 kg UF.  she was extubated in the interim and has been on 4 liters most recently  Review of systems:    Denies shortness of breath  Has still been on Tube feeds and NPO  Wants mittens off   Vitals:   09/26/21 0400 09/26/21 0500 09/26/21 0600 09/26/21 0700  BP: (!) 83/66 121/79 (!) 141/66 128/80  Pulse: (!) 110 (!) 108 97 (!) 112  Resp: (!) 30   (!) 27  Temp:      TempSrc:      SpO2: 91% 94% 94% 94%  Weight:  75.2 kg    Height:        Physical Exam:   General elderly female in bed  HEENT normocephalic atraumatic sclera anicteric Lungs clear but reduced - 96% on 4 liters  Heart S1S2 no rub Abdomen soft nontender nondistended Extremities right AKA and no left leg edema Neuro - awake and oriented to person "Jenna Brown" but not year "1998" or location Psych - has mittens still Access:  RUA AVG with bruit      OP HD: [facility] MWF    3h  70kg  F200 2/2.5 bath  350/600  RUA AVG  Hep 1000 then 500u/hr  - aranesp 35 ug q wk  - venofer 72m weekly   - hectorol 2.5 ug IV tiw  - last HD 12/23      CXR 12/27 - IMPRESSION:  New perihilar and left lower lobe airspace opacity suspicious for aspiration or edema.     Assessment/ Plan: Acute CVA / R ICA occlusion - s/p mech thrombectomy of R ICA 12/27 Acute hypoxic respiratory failure - extubated and now on nasal cannula; optimize volume with HD Hypotension - On Midodrine 10 mg TID.  Note has hx HTN on anti-hypertensives at home (seems to be a low dose regimen for heart failure)  Fever/leukocytosis - PNA. Continued worsening of leukocytosis.  On vanc and zosyn. Abx and work-up per critical care. Blood cultures NGTD.  Consider c dif testing per critical care discretion Recent +COVID - on last admit; precautions per primary  ESRD - HD per MWF schedule. Albumin with HD as well to augment UF. On nepro feeds. S/p lokelma PRN H/o CM - LVEF 20-25% last echo Anemia ckd  - aranesp increased to 100 mcg weekly and now ordered every Friday.  MBD ckd - resume renvela at 1 TID with meals for now. Started back hectorol with HD. Currently NPO on nepro feeds. Diet per primary team   Disposition - in ICU - continue inpatient monitoring   Recent Labs  Lab 09/24/21 0355 09/25/21 0437 09/25/21 0640 09/26/21 0450  K 5.5* 5.1  --  3.8  BUN 78* 76*  --  104*  CREATININE 5.79* 4.96*  --  6.54*  CALCIUM 8.8* 8.8*  --  8.7*  PHOS 3.6  --   --  5.1*  HGB 8.1*  --  7.6* 8.3*   Inpatient medications:  aspirin  325 mg Per Tube Daily   Or   aspirin  300 mg Rectal Daily   atorvastatin  80 mg Per Tube Daily   chlorhexidine gluconate (MEDLINE KIT)  15 mL Mouth Rinse BID   Chlorhexidine Gluconate Cloth  6 each Topical Q0600   darbepoetin (ARANESP) injection - NON-DIALYSIS  100 mcg Subcutaneous Q Fri-1800   doxercalciferol  2.5 mcg Intravenous Q M,W,F-HD   ezetimibe  10 mg Per Tube  Daily   feeding supplement (NEPRO CARB STEADY)  1,000 mL Per Tube Q24H   feeding supplement (PROSource TF)  45 mL Per Tube BID   heparin injection (subcutaneous)  5,000 Units Subcutaneous Q8H   insulin aspart  0-15 Units Subcutaneous Q4H   insulin aspart  4 Units Subcutaneous Q4H   insulin glargine-yfgn  20 Units Subcutaneous BID   ipratropium-albuterol  3 mL Nebulization Q6H   mouth rinse  15 mL Mouth Rinse 10 times per day   midodrine  10 mg Per Tube TID WC   pantoprazole sodium  40 mg Per Tube QHS   polyethylene glycol  17 g Per Tube Daily    sodium chloride Stopped (09/21/21 1234)   sodium chloride     dexmedetomidine (PRECEDEX) IV infusion Stopped (09/25/21 1126)   norepinephrine (LEVOPHED) Adult infusion     piperacillin-tazobactam (ZOSYN)  IV Stopped (09/26/21 0416)   vancomycin Stopped (09/24/21 1335)   acetaminophen **OR** acetaminophen (TYLENOL) oral liquid 160 mg/5 mL **OR** acetaminophen, guaiFENesin, ondansetron (ZOFRAN) IV, sennosides    Jenna Desanctis,  MD 09/26/2021 9:00 AM

## 2021-09-26 NOTE — Progress Notes (Signed)
Pt refused CPT and duoneb treatment. No distress noted, breath sounds diminished, Sat 98. RT will cont to monitor.

## 2021-09-26 NOTE — Evaluation (Signed)
Speech Language Pathology Evaluation Patient Details Name: Jenna Brown MRN: [id number] DOB: Apr 20, 1951 Today's Date: 09/26/2021 Time: 6578-4696 SLP Time Calculation (min) (ACUTE ONLY): 16 min  Problem List:  Patient Active Problem List   Diagnosis Date Noted   Acute respiratory failure with hypoxia ([place])    ESRD (end stage renal disease) on dialysis (Jenna Brown)    Secondary hypertension    Ischemic cardiomyopathy    Type 2 diabetes mellitus without complication (Jenna Brown)    Acute ischemic stroke (Jenna Brown) 09/14/2021   NSTEMI (non-ST elevated myocardial infarction) (Jenna Brown) 08/16/2021   COVID-19 virus infection 08/16/2021   Anemia due to chronic kidney disease 08/16/2021   Dyspepsia 07/01/2021   Long term (current) use of insulin (Jenna Brown) 07/01/2021   Vitamin B12 deficiency 07/01/2021   Vitamin D deficiency 07/01/2021   Above knee amputation of left lower extremity ([place]) 06/29/2021   Hyperglycemia due to type 2 diabetes mellitus (Jenna Brown) 06/29/2021   Hyperhomocysteinemia (Jenna Brown) 06/29/2021   Recurrent UTI 01/12/2021   Critical lower limb ischemia (Jenna Brown) 02/07/2020   Pleural effusion, bilateral 02/26/2019   History of non-ST elevation myocardial infarction (NSTEMI) 09/15/2018   Goals of care, counseling/discussion    Palliative care by specialist    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[facility])    Insulin-requiring or dependent type II diabetes mellitus (Jenna Brown) 01/11/2018   CAD in native artery    History of TIA (transient ischemic attack) 05/14/2015   Chronic combined systolic and diastolic heart failure (Jenna Brown) 05/14/2015   Hyperkalemia    Essential hypertension    Hyperlipidemia LDL goal <70    Anemia 05/10/2012   Anxiety 05/09/2012   Peripheral vascular disease (Jenna Brown)    Past Medical History:  Past Medical History:  Diagnosis Date   Anemia    Anxiety    takes Xanax prn   Arthritis    Cataract    left eye   CKD (chronic kidney disease), stage IV (Jenna Brown)    Coronary artery disease 2016   cath w/ 90% LAD, 95%CFX, 80%  OM 2, 60% RCA, not CABG candidate, rx medically   Full dentures    GERD (gastroesophageal reflux disease)    H/O hiatal hernia    Headache(784.0)    when b/p is elevated   Hemorrhoids    Hx of AKA (above knee amputation), right (Jenna Brown)    Hyperlipidemia    takes Simvastatin daily   Hypertension    takes Amlodipine/HCTZ/Losartan daily   Migraines    Myocardial infarction (Jenna Brown) 1990's   NSTEMI (non-ST elevated myocardial infarction) (Jenna Brown)    Peripheral vascular disease (Jenna Brown)    PONV (postoperative nausea and vomiting)    Stroke ([place])    TIA history   Type II diabetes mellitus (Jenna Brown)    on Lantus   Vertigo    takes Ativert prn   Past Surgical History:  Past Surgical History:  Procedure Laterality Date   ABDOMINAL AORTAGRAM N/A 02/29/2012   Procedure: ABDOMINAL Maxcine Ham;  Surgeon: [name], MD;  Location: [facility] C[facility];  Service: Cardiovascular;  Laterality: N/A;   ABDOMINAL AORTOGRAM W/LOWER EXTREMITY N/A 02/11/2020   Procedure: ABDOMINAL AORTOGRAM W/LOWER EXTREMITY;  Surgeon: [name], MD;  Location: [place] CV [facility];  Service: Cardiovascular;  Laterality: N/A;   ABDOMINAL AORTOGRAM W/LOWER EXTREMITY N/A 03/17/2020   Procedure: ABDOMINAL AORTOGRAM W/LOWER EXTREMITY;  Surgeon: [name], MD;  Location: [place] CV [facility];  Service: Cardiovascular;  Laterality: N/A;   AMPUTATION  05/10/2012   Procedure: AMPUTATION ABOVE KNEE;  Surgeon: Durene Fruits  Pierre Bali, MD;  Location: [place];  Service: Vascular;  Laterality: Right;   open right groin wound noted   aortogram  02/2012   AV FISTULA PLACEMENT Right 03/04/2019   Procedure: ARTERIOVENOUS (AV) FISTULA CREATION;  Surgeon: Rosetta Posner, MD;  Location: [facility];  Service: Vascular;  Laterality: Right;   CARDIAC CATHETERIZATION N/A 04/13/2015   Procedure: Right/Left Heart Cath and Coronary Angiography;  Surgeon: [name], MD;  Location: [place] [facility];  Service: Cardiovascular;  Laterality: N/A;   CLEFT PALATE REPAIR      several   COLONOSCOPY     DILATION AND CURETTAGE OF UTERUS     FEMORAL-POPLITEAL BYPASS GRAFT  04/05/2012   Procedure: BYPASS GRAFT FEMORAL-POPLITEAL ARTERY;  Surgeon: [name], MD;  Location: [facility];  Service: Vascular;  Laterality: Right;  REVISION   FEMORAL-TIBIAL BYPASS GRAFT  03/29/2012   Procedure: BYPASS GRAFT FEMORAL-TIBIAL ARTERY;  Surgeon: [name], MD;  [facility];  Service: Vascular;  Laterality: Right;  Right femoral to Posterior Tibialis with composite graft of 85mm x 80 cm ringed gortex graft and saphenous vein  ,intraoperative arteriogram   FEMOROPOPLITEAL THROMBECTOMY / EMBOLECTOMY  05/09/2012   IR CT HEAD LTD  09/14/2021   IR FLUORO GUIDE CV LINE RIGHT  03/01/2019   IR PERCUTANEOUS ART THROMBECTOMY/INFUSION INTRACRANIAL INC DIAG ANGIO  09/14/2021   IR US GUIDE VASC ACCESS LEFT  09/14/2021   IR US GUIDE VASC ACCESS RIGHT  03/01/2019   MULTIPLE TOOTH EXTRACTIONS     MYOMECTOMY     PERIPHERAL VASCULAR INTERVENTION Left 03/17/2020   Procedure: PERIPHERAL VASCULAR INTERVENTION;  Surgeon: [name], MD;  Location: [facility];  Service: Cardiovascular;  Laterality: Left;  popliteal   PR VEIN BYPASS GRAFT,AORTO-FEM-POP  05/27/11   Right SFA-Below knee Pop BP   RADIOLOGY WITH ANESTHESIA N/A 09/14/2021   Procedure: IR WITH ANESTHESIA;  Surgeon: Radiologist, Medication, MD;  Location: J[facility];  Service: Radiology;  Laterality: N/A;   REVISION OF ARTERIOVENOUS GORETEX GRAFT Right 08/21/2019   Procedure: REVISION OF ARTERIOVENOUS FISTULA RIGHT ARM;  Surgeon: Rosetta Posner, MD;  [facility];  Service: Vascular;  Laterality: Right;   TEE WITHOUT CARDIOVERSION N/A 01/03/2017   Procedure: TRANSESOPHAGEAL ECHOCARDIOGRAM (TEE);  Surgeon: Skeet Latch, MD;  Location: [facility] ENDOSCOPY;  Service: Cardiovascular;  Laterality: N/A;   THORACENTESIS  02/27/2019       TONSILLECTOMY     TUBAL LIGATION  ~ 1990   HPI:  Pt is a 71 yo female who presented on 12/27 with L  hemiparesis, slurred speech. CT head with small amount of subarachnoid hyperdensity in posterior right hemisphere, Contrast suspected. MRI CSP moderate to severe spinal canal stenosis MRI/A head Acute infarcts right MCA and PCA territory, most prominent in the right temporal lobe and right occipital lobe. Pt s/p diagnostic cerebral angiogram and mechanical thrombectomy on 09/14/21. ETT 12/27-12/7. PMH: recent hospitalization with COVID and possible STEMI, ESRD on HD, NSTEMI, CAD, HTN, HLD, right AKA 2013, TIA/stroke, GERD, and type 2 diabetes mellitus.   Assessment / Plan / Recommendation Clinical Impression  Pt participated in speech/language/cognition evaluation. She reported that she resides with her siblings who manage her finances, but that she is responsible to for medication management. Pt denied any acute changes in speech, language, or cognition. 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 Pt's  production was linguistically fluent, but she exhibited imprecise articulation, a breathy vocal quality, and a reduced vocal intensity which together impacted speech intelligibility across all levels. Pt reports that this is baseline, but an acute exacerbation of baseline speech impairments is suspected. She exhibited cognitive-communication deficits in the areas of orientation, awareness, attention, memory, problem solving, and executive function. Apragmatism was noted with impairments in eye contact, affect, topic appropriateness, and global coherence with tangential tendencies. Skilled SLP services are clinically indicated at this time to cognitive-linguistic function and for motor speech if this is not her baseline.    SLP Assessment  SLP Recommendation/Assessment: Patient needs continued Speech Lanaguage Pathology Services SLP Visit Diagnosis: Cognitive communication deficit (R41.841);Dysarthria and  anarthria (R47.1)    Recommendations for follow up therapy are one component of a multi-disciplinary discharge planning process, led by the attending physician.  Recommendations may be updated based on patient status, additional functional criteria and insurance authorization.    Follow Up Recommendations  Skilled nursing-short term rehab (<3 hours/day)    Assistance Recommended at Discharge  Frequent or constant Supervision/Assistance  Functional Status Assessment Patient has had a recent decline in their functional status and demonstrates the ability to make significant improvements in function in a reasonable and predictable amount of time.  Frequency and Duration min 2x/week  2 weeks      SLP Evaluation Cognition  Overall Cognitive Status: Impaired/Different from baseline Arousal/Alertness: Awake/alert Orientation Level: Oriented to person;Oriented to place;Oriented to situation;Disoriented to time Year:  (response denied) Month:  (response denied) Day of Week: Correct Attention: Focused;Sustained Focused Attention: Impaired Focused Attention Impairment: Verbal complex Sustained Attention: Impaired Sustained Attention Impairment: Verbal complex Memory: Impaired Memory Impairment: Retrieval deficit;Decreased short term memory Awareness: Impaired Awareness Impairment: Intellectual impairment Problem Solving: Impaired Problem Solving Impairment: Verbal complex;Verbal basic Executive Function: Reasoning Reasoning: Impaired Reasoning Impairment: Verbal basic       Comprehension  Auditory Comprehension Overall Auditory Comprehension: Appears within functional limits for tasks assessed Yes/No Questions: Within Functional Limits Conversation: Complex    Expression Expression Primary Mode of Expression: Verbal Verbal Expression Overall Verbal Expression: Impaired Initiation: No impairment Level of Generative/Spontaneous Verbalization: Conversation Naming: No  impairment Pragmatics: Impairment Impairments: Topic maintenance;Eye contact;Abnormal affect;Topic appropriateness Interfering Components: Attention   Oral / Motor  Oral Motor/Sensory Function Overall Oral Motor/Sensory Function: Within functional limits Motor Speech Overall Motor Speech: Impaired Respiration: Within functional limits Phonation: Low vocal intensity;Hoarse;Breathy Resonance: Hyponasality Articulation: Impaired Level of Impairment: Phrase Intelligibility: Intelligibility reduced Word: 50-74% accurate Phrase: 25-49% accurate Sentence: 25-49% accurate Conversation: 25-49% accurate Motor Planning: Witnin functional limits Motor Speech Errors: Aware           Iveliz Garay I. Hardin Negus, [place], [place] Office number [phone] Pager [place] 09/26/2021, 10:31 AM

## 2021-09-26 NOTE — Evaluation (Signed)
Clinical/Bedside Swallow Evaluation Patient Details  Name: Jenna Brown MRN: [id number] Date of Birth: 09-07-1951  Today's Date: 09/26/2021 Time: SLP Start Time (ACUTE ONLY): 81 SLP Stop Time (ACUTE ONLY): 0933 SLP Time Calculation (min) (ACUTE ONLY): 18 min  Past Medical History:  Past Medical History:  Diagnosis Date   Anemia    Anxiety    takes Xanax prn   Arthritis    Cataract    left eye   CKD (chronic kidney disease), stage IV ([facility])    Coronary artery disease 2016   cath w/ 90% LAD, 95%CFX, 80% OM 2, 60% RCA, not CABG candidate, rx medically   Full dentures    GERD (gastroesophageal reflux disease)    H/O hiatal hernia    Headache(784.0)    when b/p is elevated   Hemorrhoids    Hx of AKA (above knee amputation), right ([facility])    Hyperlipidemia    takes Simvastatin daily   Hypertension    takes Amlodipine/HCTZ/Losartan daily   Migraines    Myocardial infarction ([place]) 1990's   NSTEMI (non-ST elevated myocardial infarction) ([place])    Peripheral vascular disease ([facility])    PONV (postoperative nausea and vomiting)    Stroke ([place])    TIA history   Type II diabetes mellitus ([place])    on Lantus   Vertigo    takes Ativert prn   Past Surgical History:  Past Surgical History:  Procedure Laterality Date   ABDOMINAL AORTAGRAM N/A 02/29/2012   Procedure: ABDOMINAL Maxcine Ham;  Surgeon: [name], MD;  [facility] CATH LAB;  Service: Cardiovascular;  Laterality: N/A;   ABDOMINAL AORTOGRAM W/LOWER EXTREMITY N/A 02/11/2020   Procedure: ABDOMINAL AORTOGRAM W/LOWER EXTREMITY;  Surgeon: [name], MD;  Location: [place] CV LAB;  Service: Cardiovascular;  Laterality: N/A;   ABDOMINAL AORTOGRAM W/LOWER EXTREMITY N/A 03/17/2020   Procedure: ABDOMINAL AORTOGRAM W/LOWER EXTREMITY;  Surgeon: [name], MD;  Location: [place] [facility] LAB;  Service: Cardiovascular;  Laterality: N/A;   AMPUTATION  05/10/2012   Procedure: AMPUTATION ABOVE KNEE;  Surgeon: [name],  MD;  Location: [facility] OR;  Service: Vascular;  Laterality: Right;   open right groin wound noted   aortogram  02/2012   AV FISTULA PLACEMENT Right 03/04/2019   Procedure: ARTERIOVENOUS (AV) FISTULA CREATION;  Surgeon: Rosetta Posner, MD;  Location: [place];  Service: Vascular;  Laterality: Right;   CARDIAC CATHETERIZATION N/A 04/13/2015   Procedure: Right/Left Heart Cath and Coronary Angiography;  Surgeon: [name], MD;  Location: [place] [facility] LAB;  Service: Cardiovascular;  Laterality: N/A;   CLEFT PALATE REPAIR     several   COLONOSCOPY     DILATION AND CURETTAGE OF UTERUS     FEMORAL-POPLITEAL BYPASS GRAFT  04/05/2012   Procedure: BYPASS GRAFT FEMORAL-POPLITEAL ARTERY;  Surgeon: [name], MD;  Location: [place];  Service: Vascular;  Laterality: Right;  REVISION   FEMORAL-TIBIAL BYPASS GRAFT  03/29/2012   Procedure: BYPASS GRAFT FEMORAL-TIBIAL ARTERY;  Surgeon: [name], MD;  Location: [place] OR;  Service: Vascular;  Laterality: Right;  Right femoral to Posterior Tibialis with composite graft of 108mm x 80 cm ringed gortex graft and saphenous vein  ,intraoperative arteriogram   FEMOROPOPLITEAL THROMBECTOMY / EMBOLECTOMY  05/09/2012   IR CT HEAD LTD  09/14/2021   IR FLUORO GUIDE CV LINE RIGHT  03/01/2019   IR PERCUTANEOUS ART THROMBECTOMY/INFUSION INTRACRANIAL INC DIAG ANGIO  09/14/2021   IR US GUIDE VASC ACCESS LEFT  09/14/2021   IR US GUIDE VASC ACCESS RIGHT  03/01/2019   MULTIPLE TOOTH EXTRACTIONS     MYOMECTOMY     PERIPHERAL VASCULAR INTERVENTION Left 03/17/2020   Procedure: PERIPHERAL VASCULAR INTERVENTION;  Surgeon: [name], MD;  Location: [place] [facility] LAB;  Service: Cardiovascular;  Laterality: Left;  popliteal   PR VEIN BYPASS GRAFT,AORTO-FEM-POP  05/27/11   Right SFA-Below knee Pop BP   RADIOLOGY WITH ANESTHESIA N/A 09/14/2021   Procedure: IR WITH ANESTHESIA;  Surgeon: Radiologist, Medication, MD;  Location: [place];  Service: Radiology;  Laterality: N/A;   REVISION OF  ARTERIOVENOUS GORETEX GRAFT Right 08/21/2019   Procedure: REVISION OF ARTERIOVENOUS FISTULA RIGHT ARM;  Surgeon: Rosetta Posner, MD;  [facility]: [facility] OR;  Service: Vascular;  Laterality: Right;   TEE WITHOUT CARDIOVERSION N/A 01/03/2017   Procedure: TRANSESOPHAGEAL ECHOCARDIOGRAM (TEE);  Surgeon: Skeet Latch, MD;  Location: [facility] ENDOSCOPY;  Service: Cardiovascular;  Laterality: N/A;   THORACENTESIS  02/27/2019       TONSILLECTOMY     TUBAL LIGATION  ~ 1990   HPI:  Pt is a 71 yo female who presented on 12/27 with L hemiparesis, slurred speech. CT head with small amount of subarachnoid hyperdensity in posterior right hemisphere, Contrast suspected. MRI CSP moderate to severe spinal canal stenosis MRI/A head Acute infarcts right MCA and PCA territory, most prominent in the right temporal lobe and right occipital lobe. Pt s/p diagnostic cerebral angiogram and mechanical thrombectomy on 09/14/21. ETT 12/27-12/7. PMH: recent hospitalization with COVID and possible STEMI, ESRD on HD, NSTEMI, CAD, HTN, HLD, right AKA 2013, TIA/stroke, GERD, and type 2 diabetes mellitus.    Assessment / Plan / Recommendation  Clinical Impression  Pt was seen for bedside swallow evaluation and she denied a history of oropharyngeal dysphagia. She stated that she typically consumes a regular texture diet and thin liquids, but usually eats with full dentures which are not currently present. Oral mechanism exam was limited due to pt's difficulty following some commands; however, oral motor strength and ROM appeared grossly WFL. She was edentulous. Vocal quality was breathy and intensity reduced, suggesting vocal fold insufficiency and increasing aspiration risk. She presented with symptoms of oropharyngeal dysphagia characterized by left-sided anterior spillage of liquids, prolonged mastication, mild oral residue which was cleared with a liquid wash, and signs of aspiration with thin and nectar thick liquids. It is recommended that pt's  NPO status be maintained, but that ice chips be allowed after oral care. Cortrak may continue to be used to nutrition and critical meds but if this source is lost, meds may be crushed and given in puree. SLP will follow to assess improvement in swallow function and for instrumental assessment pending performance in subsequent sessions. SLP Visit Diagnosis: Dysphagia, unspecified (R13.10)    Aspiration Risk  Mild aspiration risk;Moderate aspiration risk    Diet Recommendation NPO;Ice chips PRN after oral care   Medication Administration: Via alternative means Postural Changes: Seated upright at 90 degrees    Other  Recommendations Oral Care Recommendations: Oral care QID;Oral care prior to ice chip/H20    Recommendations for follow up therapy are one component of a multi-disciplinary discharge planning process, led by the attending physician.  Recommendations may be updated based on patient status, additional functional criteria and insurance authorization.  Follow up Recommendations        Assistance Recommended at Discharge    Functional Status Assessment Patient has had a recent decline in their functional status and demonstrates the ability  to make significant improvements in function in a reasonable and predictable amount of time.  Frequency and Duration min 2x/week  2 weeks       Prognosis Prognosis for Safe Diet Advancement: Good Barriers to Reach Goals: Cognitive deficits;Severity of deficits      Swallow Study   General Date of Onset: 09/25/21 HPI: Pt is a 71 yo female who presented on 12/27 with L hemiparesis, slurred speech. CT head with small amount of subarachnoid hyperdensity in posterior right hemisphere, Contrast suspected. MRI CSP moderate to severe spinal canal stenosis MRI/A head Acute infarcts right MCA and PCA territory, most prominent in the right temporal lobe and right occipital lobe. Pt s/p diagnostic cerebral angiogram and mechanical thrombectomy on 09/14/21.  ETT 12/27-12/7. PMH: recent hospitalization with COVID and possible STEMI, ESRD on HD, NSTEMI, CAD, HTN, HLD, right AKA 2013, TIA/stroke, GERD, and type 2 diabetes mellitus. Type of Study: Bedside Swallow Evaluation Previous Swallow Assessment: none Diet Prior to this Study: NPO Respiratory Status: Nasal cannula History of Recent Intubation: Yes Length of Intubations (days): 11 days Date extubated: 09/25/21 Behavior/Cognition: Alert;Cooperative;Pleasant mood Oral Cavity Assessment: Within Functional Limits Oral Care Completed by SLP: No Vision: Functional for self-feeding Self-Feeding Abilities: Needs assist Patient Positioning: Upright in bed;Postural control interferes with function Baseline Vocal Quality: Low vocal intensity;Hoarse Volitional Cough: Strong Volitional Swallow: Able to elicit    Oral/Motor/Sensory Function Overall Oral Motor/Sensory Function: Within functional limits   Ice Chips Ice chips: Within functional limits Presentation: Spoon   Thin Liquid Thin Liquid: Impaired Presentation: Straw;Cup Oral Phase Impairments: Reduced labial seal Oral Phase Functional Implications: Left anterior spillage Pharyngeal  Phase Impairments: Cough - Immediate;Cough - Delayed    Nectar Thick Nectar Thick Liquid: Impaired Presentation: Straw Pharyngeal Phase Impairments: Cough - Immediate   Honey Thick Honey Thick Liquid: Not tested   Puree Puree: Impaired Presentation: Spoon Oral Phase Impairments: Reduced lingual movement/coordination Oral Phase Functional Implications: Oral residue   Solid     Solid: Impaired Presentation: Spoon Oral Phase Impairments: Impaired mastication Oral Phase Functional Implications: Impaired mastication     Annasophia Crocker I. Hardin Negus, [place], [place] Office number [phone] Pager [phone]   Horton Marshall 09/26/2021,9:40 AM

## 2021-09-26 NOTE — Progress Notes (Addendum)
NAME:  Jenna Brown, MRN:  [id number], DOB:  10-21-1950, LOS: 24 ADMISSION DATE:  09/14/2021, CONSULTATION DATE:  09/14/2021 REFERRING MD: Dr. Debbrah Alar CHIEF COMPLAINT: Acute R ICA stroke s/p thrombectomy  History of Present Illness:  71 year old woman who presented to [facility] 12/27 with acute R ICA stroke. S/p mechanical thrombectomy 12/28. PMHx significant for HTN, HLD, T2DM, CAD, PVD (s/p R AKA), ESRD on HD. Of note, recent admission for COVID-19 infection (treated with monotherapy as an outpatient); she then developed chest pain with concern for possible NSTEMI.  PCCM consulted for help with vent management post-thrombectomy.  Pertinent Medical History:    has a past medical history of Anemia, Anxiety, Arthritis, Cataract, CKD (chronic kidney disease), stage IV ([place]), Coronary artery disease (2016), Full dentures, GERD (gastroesophageal reflux disease), H/O hiatal hernia, Headache(784.0), Hemorrhoids, AKA (above knee amputation), right ([place]), Hyperlipidemia, Hypertension, Migraines, Myocardial infarction ([place]) (1990's), NSTEMI (non-ST elevated myocardial infarction) ([place]), Peripheral vascular disease ([place]), PONV (postoperative nausea and vomiting), Stroke ([place]), Type II diabetes mellitus ([place]), and Vertigo.   Significant Hospital Events: Including procedures, antibiotic start and stop dates in addition to other pertinent events   12/27 PCCM consult 12/28 CT head with small amount of subarachnoid hyperdensity in posterior right hemisphere, Contrast suspected. MRI CSP moderate to severe spinal canal stenosis MRI/A head Acute infarcts right MCA and PCA territory, most prominent in the right temporal lobe and right occipital lobe.  ECHO EF 25-30%, global hypokinesis, Grade I DD, RVSF normal 12/31 low blood pressure while on dialysis last night.  Was briefly on pressors.  Continues to fail pressure support weans due to desats. Started cefepime 1/3 Failing vent wean 2/2 desaturations. PEEP increased  to 8 (5). HD today per Nephro, longer session given K 6.2. CXR stable. 1/4: received HD; required Neo due to hypotension 1/5: wbc rising; broadened abx to vanc and zosyn.  No cuff leak started on Decadron 1/7 Extubated   Interim History / Subjective:   Extubated.  She is comfortable with no distress  Objective:  Blood pressure 140/69, pulse (!) 101, temperature 99.3 F (37.4 C), temperature source Oral, resp. rate (!) 28, height 5\' 4"  (1.626 m), weight 75.2 kg, SpO2 97 %.        Intake/Output Summary (Last 24 hours) at 09/26/2021 1112 Last data filed at 09/26/2021 1000 Gross per 24 hour  Intake 1180.62 ml  Output 2 ml  Net 1178.62 ml   Filed Weights   09/24/21 0716 09/25/21 0500 09/26/21 0500  Weight: 75.3 kg 75 kg 75.2 kg   Physical Examination: Gen:      No acute distress HEENT:  EOMI, sclera anicteric Neck:     No masses; no thyromegaly Lungs:    Clear to auscultation bilaterally; normal respiratory effort CV:         Regular rate and rhythm; no murmurs Abd:      + bowel sounds; soft, non-tender; no palpable masses, no distension Ext:    No edema; adequate peripheral perfusion Skin:      Warm and dry; no rash Neuro: alert and oriented  Lab/imaging reviewed Significant for creatinine 6.54, WBC count higher at 37.3  Resolved Hospital Problem List:  Hyponatremia: improved  Assessment & Plan:  Acute ischemic stroke with occlusion of proximal right internal carotid S/p mechanical thrombectomy, (TICI 3), CT head 12/28 with small amount of subarachnoid hyperdensity in posterior right hemisphere, Contrast suspected. 12/28 MRI CSP moderate to severe spinal canal stenosis MRI/A head acute infarcts right MCA and PCA  territory, most prominent in the right temporal lobe and right occipital lobe. P: Management per neurology Continue aspirin, statin No anticoagulation due to large nature of stroke  Acute respiratory failure with hypoxia COVID-19 Positive- asymptomatic KLEBSIELLA  ORNITHINOLYTICA pneumonia: 1/2 ceftriaxone<<1/5 broadened abx to zosyn and vanc Small LL pleural effusion P: Stable post extubation. Wean down o2 as tolerated.  Antibiotics broadened to Vanco and Zosyn.  No growth on repeat cultures.  Leukocytosis WBC count is higher in spite of broad antibiotic coverage.  Blood cultures are negative Check C. difficile, chest x-ray  Intermittent hypotension P: Continue midodrine, as needed Levophed for hemodialysis next  End-stage renal disease on hemodialysis (MWF) Hyperkalemia P: Hemodialysis per nephrology  Severe ischemic cardiomyopathy HX HTN HX HLD 12/29 ECHO EF 25-30%, global hypokinesis, Grade I DD, RVSF normal. Remains off pressors. P: -BB on hold due to hypotension -ASA, statin, zetia -telemetry monitoring  DM2 P: Continue SSI and basal insulin Continue tube feed coverage CBG monitoring  GERD P: PPI  Anemia of chronic illness P: Trend CBC Likely in the setting of ESRD  Best Practice: (right click and "Reselect all SmartList Selections" daily)   Diet/type: tubefeeds DVT prophylaxis: SCD GI prophylaxis: PPI Lines: N/A Foley:  N/A Code Status:  full code Last date of multidisciplinary goals of care discussion: Full code  Critical care time:     Marshell Garfinkel MD [facility] See Amion for pager  If no response to pager , please call [phone] until 7pm After 7:00 pm call Elink  [phone] 09/26/2021, 11:12 AM

## 2021-09-26 NOTE — Progress Notes (Signed)
Pharmacy Antibiotic Note  Jenna Brown is a 71 y.o. female admitted on 09/14/2021 with left-sided weakness s/p mechanical thrombectomy.  Patient grew Klebsiella in tracheal aspirate culture and antibiotic was narrowed from cefepime to Rocephin.  Now with fever and worsening leukocytosis in setting of copious sections, Pharmacy has been consulted to broaden antibiotic to vancomycin and Zosyn. Today is day 4.  Patient has ESRD on HD (last 1/6).  Tmax 99.3, WBC increased to 37.3, cultures ngtd.  Plan: Vancomycin 750mg  IV qHD MWF Zosyn 2.25gm IV Q8H Monitor HD schedule/tolerance, clinical progress, micro data, vanc level as indicated F/U C diff results  Height: 5\' 4"  (162.6 cm) Weight: 75.2 kg (165 lb 12.6 oz) IBW/kg (Calculated) : 54.7  Temp (24hrs), Avg:98.6 F (37 C), Min:97.9 F (36.6 C), Max:99.3 F (37.4 C)  Recent Labs  Lab 09/22/21 0416 09/23/21 0436 09/24/21 0355 09/25/21 0437 09/25/21 0640 09/26/21 0450  WBC 15.6* 19.1* 21.4*  --  28.3* 37.3*  CREATININE 5.40* 4.76* 5.79* 4.96*  --  6.54*     Estimated Creatinine Clearance: 8.2 mL/min (A) (by C-G formula based on SCr of 6.54 mg/dL (H)).    Allergies  Allergen Reactions   Plavix [Clopidogrel Bisulfate] Itching   Codeine Other (See Comments)    "makes me feel strange"   Hydrocodone Other (See Comments)    Feels disorinated   Losartan Other (See Comments)   Tylenol With Codeine #3 [Acetaminophen-Codeine] Other (See Comments)    "doesn't make me feel right"   Cefepime 12/31>> 1/3 CTX 1/3 >> 1/5 Vanc 1/5 >> Zosyn 1/5 >>   12/27 Covid - positive (recently treated outpt) 12/27 MRSA PCR - negative 12/30 TA - Kleb ornithinolytica (r-amp) 1/5 TA - reinc 1/5 BCx - ngtd   Thank you for involving pharmacy in this patient's care.  Renold Genta, PharmD, BCPS Clinical Pharmacist Clinical phone for 09/26/2021 until 3p is [phone] 09/26/2021 1:06 PM  **Pharmacist phone directory can be found on [email or web address] listed under  [place]**

## 2021-09-26 NOTE — Progress Notes (Signed)
[facility] Progress Note Patient Name: Jenna Brown DOB: 09/01/1951 MRN: [id number]   Date of Service  09/26/2021  HPI/Events of Note  Asking for flexi seal for loose bowel. On contact isolation. Covid +. Cl difficle is pending.  eICU Interventions  Flexi seal ordered     Intervention Category Minor Interventions: Other: (diarrhea)  Elmer Sow 09/26/2021, 8:25 PM

## 2021-09-26 NOTE — Progress Notes (Addendum)
STROKE TEAM PROGRESS NOTE   INTERVAL HISTORY No family at the bedside. She is awake alert and following commands. Remains extubated. Speech is hypophonic. Hemodynamically stable. WBC 37.3 this morning.   Vitals:   09/26/21 0800 09/26/21 0900 09/26/21 1000 09/26/21 1100  BP: (!) 151/135 130/70 137/84 140/69  Pulse: (!) 112 (!) 106 100 (!) 101  Resp: (!) 30 (!) 24 (!) 39 (!) 28  Temp:  99.3 F (37.4 C)    TempSrc:  Oral    SpO2: 92% 95% 96% 97%  Weight:      Height:       CBC:  Recent Labs  Lab 09/22/21 0416 09/23/21 0436 09/25/21 0640 09/26/21 0450  WBC 15.6*   < > 28.3* 37.3*  NEUTROABS 9.8*  --   --   --   HGB 7.9*   < > 7.6* 8.3*  HCT 27.0*   < > 24.6* 27.3*  MCV 98.2   < > 95.7 96.8  PLT 203   < > 217 306   < > = values in this interval not displayed.    Basic Metabolic Panel:  Recent Labs  Lab 09/24/21 0355 09/25/21 0437 09/26/21 0450  NA 135 133* 137  K 5.5* 5.1 3.8  CL 96* 95* 94*  CO2 24 21* 21*  GLUCOSE 292* 288* 100*  BUN 78* 76* 104*  CREATININE 5.79* 4.96* 6.54*  CALCIUM 8.8* 8.8* 8.7*  PHOS 3.6  --  5.1*    Lipid Panel:  No results for input(s): CHOL, TRIG, HDL, CHOLHDL, VLDL, LDLCALC in the last 168 hours.  HgbA1c:  No results for input(s): HGBA1C in the last 168 hours.  Urine Drug Screen:  No results for input(s): LABOPIA, COCAINSCRNUR, LABBENZ, AMPHETMU, THCU, LABBARB in the last 168 hours.   Alcohol Level  No results for input(s): ETH in the last 168 hours.  IMAGING past 24 hours No results found.  PHYSICAL EXAM  Temp:  [97.9 F (36.6 C)-99.6 F (37.6 C)] 99.3 F (37.4 C) (01/08 0900) Pulse Rate:  [43-112] 101 (01/08 1100) Resp:  [18-39] 28 (01/08 1100) BP: (83-151)/(56-135) 140/69 (01/08 1100) SpO2:  [91 %-99 %] 97 % (01/08 1100) Weight:  [75.2 kg] 75.2 kg (01/08 0500)  General - intubated off sedation, in no acute distress  Cardiovascular - Regular rate and rhythm.  Neurological: Awake alert and interactive. Oriented  to person and place.  Eyes are open and follows simple commands. PERRL, tracking bilaterally, no significant gaze palsy but still has left hemianopia, not blinking to visual threat on the left. Voice is hypophonic and hoarse. Will move all extremities in response to commands with at least 4/5 strength in RUE and RLE (right AKA chronic), 2/5 strength in LUE proximal and 3/5 bicep, and LLE 3/5 strength. Sensation and coordination not cooperative, and gait not tested.  ASSESSMENT/PLAN Ms. KHANIYAH BEZEK is a 71 y.o. female with history of ESRD on IHD, NSTEMI, CAD, HTN, HLD, right AKA, stroke and T2DM presenting with left sided weakness, left facial droop and slurred speech.  She was brought to the ED.  TNK was not given as she presented outside the window.  CT angiography revealed LVO in the proximal right ICA with perfusion study showing core infarct of 29cc and penumbra of 168cc in right MCA and PCA territories. Mechanical thrombectomy was performed with TICI 3 flow achieved.  Receiving hemodialysis per nephrology. Extubated 1/7 to [facility]. Remains extubated and doing well from a respiratory standpoint.  Cortrak in place for  TF and medications.   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 Bilateral basal ganglia and left cerbellar chronic infarcts, chronic C1-C2 subluxation. ASPECTS 10.    CTA head & neck emergent LVO at proximal right ICA, isolated right M1 segment and fetal type right PCA, left vertebral artery occlusion, stenotic right vertebral artery, high grade mid basilar stenosis CT perfusion 29/197 Status post IR with TICI3 at right ICA.  MRI  acute infarcts in right MCA and PCA territory, punctate foci of restricted diffusion in bilateral forntal and parietal lobes as well as left occipital lobe MRA  resolution of right ICA occlusion  2D Echo EF 25-30%, no LV thrombus LDL  74 HgbA1c 5.5 UDS negative VTE prophylaxis -Heparin subcu aspirin 81 mg daily and Brilinta (ticagrelor) 90 mg bid prior to admission, now on aspirin 325 mg daily. No AC now given large infarct size.  May consider [facility] later given cardiomyopathy with low EF. Therapy recommendations:  pending Disposition:  pending  Respiratory failure Extubated 1/7 to [facility] Management per CCM Currently tolerating well Consider tracheostomy if reintubated  Elevated WBC Fever WBC 15.6 > 19.1->21.4->28.3->37.3 T-max 100.1-> afebrile with Possible aspiration pneumonia.  Cefepime -> vancomycin and Zosyn managed by CCM.  Cardiomyopathy 07/2021 EF 20 to 25% This admission EF 25 to 30% Home meds including Imdur and Coreg Currently not on home meds due to hypotension Management per CCM No AC now given large infarct size.  May consider [facility] later given low EF and stroke.  Hx of hypertension Hypotension Home meds:  Coreg 3.125 mg BID BP on the low end Off Levophed Long-term BP goal normotensive Continue midodrine 10 TID  Hyperlipidemia Home meds:  atorvastatin 80 mg daily, resumed in hospital LDL 74, goal < 70 Zetia 10 mg daily and atorvastatin 80mg  Continue statin at discharge  Diabetes Hemoglobin A1c 5.5, uncontrolled Hyperglycemia On insulin SSI CBG monitoring Close PCP follow-up for better DM control Diabetic consult.  Other Stroke Risk Factors Advanced Age >/= 34  Former cigarette smoker Hx stroke Coronary artery disease PVD  Other Active Problems ESRD on IHD - Appreciate nephrology recommendations  Right LE AKA Initial CT showed C1-C2 subluxation with anterior spinal cord compression, similar as prior.  MRI C-spine showed diffuse cervical spine spinal canal stenosis with cord flattening but no cord signal abnormality. Anemia due to ESRD, hemoglobin 8.5 > 7.9 > 7.5->7.6->8.3  Hospital day # 12  Patient seen and examined by NP/APP with MD. MD to update note as needed.   Janine Ores, DNP, FNP-BC [facility] Pager: [phone]  ATTENDING NOTE: I reviewed above note and agree with the assessment and plan. Pt was seen and examined.   No family at bedside.  Patient extubated yesterday, so far tolerating well, no need reintubation.  Patient awake alert, follow simple commands on the right, still has left hemiparesis but left lower extremity 3/5, left upper extremity 2+/5.  Moderate dysarthria but no aphasia but paucity of speech.  Hemoglobin improved, no fever however her WBC jumped to 37.3, CCM on board, changed antibiotic to Zosyn and vancomycin.  Continue aspirin and statin for now.  PT/OT recommendation pending  For detailed assessment and plan, please refer to above as I have made changes wherever appropriate.   This patient is critically ill due to large right MCA and PCA stroke, respiratory failure, ESRD on dialysis, CHF  with low EF, leukocytosis and at significant risk of neurological worsening, death form sepsis, septic shock, recurrent stroke, hemorrhagic transformation, seizure. This patient's care requires constant monitoring of vital signs, hemodynamics, respiratory and cardiac monitoring, review of multiple databases, neurological assessment, discussion with family, other specialists and medical decision making of high complexity. I spent 35 minutes of neurocritical care time in the care of this patient.    Rosalin Hawking, MD PhD Stroke Neurology 09/26/2021 7:35 PM     To contact Stroke Continuity provider, please refer to [email or web address] After hours, contact General Neurology

## 2021-09-27 DIAGNOSIS — J9601 Acute respiratory failure with hypoxia: Secondary | ICD-10-CM | POA: Diagnosis not present

## 2021-09-27 DIAGNOSIS — I214 Non-ST elevation (NSTEMI) myocardial infarction: Secondary | ICD-10-CM

## 2021-09-27 DIAGNOSIS — I639 Cerebral infarction, unspecified: Secondary | ICD-10-CM | POA: Diagnosis not present

## 2021-09-27 LAB — CBC
HCT: 25.4 % — ABNORMAL LOW (ref 36.0–46.0)
Hemoglobin: 7.8 g/dL — ABNORMAL LOW (ref 12.0–15.0)
MCH: 29.7 pg (ref 26.0–34.0)
MCHC: 30.7 g/dL (ref 30.0–36.0)
MCV: 96.6 fL (ref 80.0–100.0)
Platelets: 257 10*3/uL (ref 150–400)
RBC: 2.63 MIL/uL — ABNORMAL LOW (ref 3.87–5.11)
RDW: 15.7 % — ABNORMAL HIGH (ref 11.5–15.5)
WBC: 27.8 10*3/uL — ABNORMAL HIGH (ref 4.0–10.5)
nRBC: 1.8 % — ABNORMAL HIGH (ref 0.0–0.2)

## 2021-09-27 LAB — C DIFFICILE (CDIFF) QUICK SCRN (NO PCR REFLEX)
C Diff antigen: NEGATIVE
C Diff interpretation: NOT DETECTED
C Diff toxin: NEGATIVE

## 2021-09-27 LAB — GLUCOSE, CAPILLARY
Glucose-Capillary: 120 mg/dL — ABNORMAL HIGH (ref 70–99)
Glucose-Capillary: 130 mg/dL — ABNORMAL HIGH (ref 70–99)
Glucose-Capillary: 135 mg/dL — ABNORMAL HIGH (ref 70–99)
Glucose-Capillary: 137 mg/dL — ABNORMAL HIGH (ref 70–99)
Glucose-Capillary: 140 mg/dL — ABNORMAL HIGH (ref 70–99)
Glucose-Capillary: 175 mg/dL — ABNORMAL HIGH (ref 70–99)
Glucose-Capillary: 88 mg/dL (ref 70–99)

## 2021-09-27 LAB — BASIC METABOLIC PANEL
Anion gap: 24 — ABNORMAL HIGH (ref 5–15)
BUN: 129 mg/dL — ABNORMAL HIGH (ref 8–23)
CO2: 21 mmol/L — ABNORMAL LOW (ref 22–32)
Calcium: 8.6 mg/dL — ABNORMAL LOW (ref 8.9–10.3)
Chloride: 93 mmol/L — ABNORMAL LOW (ref 98–111)
Creatinine, Ser: 7.71 mg/dL — ABNORMAL HIGH (ref 0.44–1.00)
GFR, Estimated: 5 mL/min — ABNORMAL LOW (ref >60)
Glucose, Bld: 190 mg/dL — ABNORMAL HIGH (ref 70–99)
Potassium: 4.4 mmol/L (ref 3.5–5.1)
Sodium: 138 mmol/L (ref 135–145)

## 2021-09-27 LAB — TROPONIN I (HIGH SENSITIVITY): Troponin I (High Sensitivity): 11257 ng/L (ref <18)

## 2021-09-27 MED ORDER — NITROGLYCERIN 0.4 MG SL SUBL
0.4000 mg | SUBLINGUAL_TABLET | SUBLINGUAL | Status: DC | PRN
  Administered 2021-09-27: 21:00:00 0.4 mg via SUBLINGUAL
  Filled 2021-09-27: qty 1

## 2021-09-27 MED ORDER — ASPIRIN 325 MG PO TABS
325.0000 mg | ORAL_TABLET | Freq: Once | ORAL | Status: AC
  Administered 2021-09-27: 325 mg via ORAL
  Filled 2021-09-27: qty 1

## 2021-09-27 MED ORDER — NITROGLYCERIN 2 % TD OINT
1.0000 [in_us] | TOPICAL_OINTMENT | Freq: Four times a day (QID) | TRANSDERMAL | Status: DC
  Filled 2021-09-27 (×2): qty 30

## 2021-09-27 MED ORDER — MIDODRINE HCL 5 MG PO TABS
5.0000 mg | ORAL_TABLET | Freq: Three times a day (TID) | ORAL | Status: DC
  Administered 2021-09-28: 5 mg
  Filled 2021-09-27: qty 1

## 2021-09-27 MED ORDER — SEVELAMER CARBONATE 0.8 G PO PACK
0.8000 g | PACK | Freq: Three times a day (TID) | ORAL | Status: DC
  Administered 2021-09-27 – 2021-10-01 (×11): 0.8 g via ORAL
  Filled 2021-09-27 (×12): qty 1

## 2021-09-27 MED ORDER — FENTANYL CITRATE PF 50 MCG/ML IJ SOSY: 12.5000 ug | PREFILLED_SYRINGE | INTRAMUSCULAR | Status: AC | PRN

## 2021-09-27 NOTE — Progress Notes (Addendum)
Patient with complaint of chest pain - cardiology contacted by CCM. Trops ordered and came back at 11,000. She will need heparinization for NSTEMI. From a stroke perspective, she is now D13 post stroke and intervention and if indicated form cardiac standpoint - Heparin should be OK to use - stroke protocol - no bolus Stat CTH if any neurological worsening. Keep in ICU for now. D/w Cards on call and Dr Lucile Shutters PCCM   -- Amie Portland, MD Neurologist [facility] Pager: [phone]

## 2021-09-27 NOTE — TOC Initial Note (Signed)
Transition of Care [facility]) - Initial/Assessment Note    Patient Details  Name: Jenna Brown MRN: [id number] Date of Birth: 1951/03/12  Transition of Care [facility]) CM/SW Contact:    Benard Halsted, LCSW Phone Number: 09/27/2021, 12:49 PM  Clinical Narrative:                 CSW left voicemail for patient's daughter regarding discharge planning.  Expected Discharge Plan: Skilled Nursing Facility Barriers to Discharge: Continued Medical Work up, SNF Pending bed offer   Patient Goals and CMS Choice Patient states their goals for this hospitalization and ongoing recovery are:: Rehab CMS Medicare.gov Compare Post Acute Care list provided to:: Patient Represenative (must comment) Choice offered to / list presented to : Adult Children  Expected Discharge Plan and Services Expected Discharge Plan: [place] In-house Referral: Clinical Social Work   Post Acute Care Choice: [place] Living arrangements for the past 2 months: [place]                                      Prior Living Arrangements/Services Living arrangements for the past 2 months: Single Family Home Lives with:: Self, Adult Children Patient language and need for interpreter reviewed:: Yes Do you feel safe going back to the place where you live?: Yes      Need for Family Participation in Patient Care: Yes (Comment) Care giver support system in place?: Yes (comment) Current home services: DME Criminal Activity/Legal Involvement Pertinent to Current Situation/Hospitalization: No - Comment as needed  Activities of Daily Living      Permission Sought/Granted Permission sought to share information with : Facility Sport and exercise psychologist, Family Supports Permission granted to share information with : No  Share Information with NAME: Joseph Art     Permission granted to share info w Relationship: Daugher  Permission granted to share info w Contact Information:  [phone]  Emotional Assessment Appearance:: Appears stated age Attitude/Demeanor/Rapport: Unable to Assess Affect (typically observed): Unable to Assess Orientation: : Oriented to Self, Oriented to Place, Oriented to  Time, Oriented to Situation Alcohol / Substance Use: Not Applicable Psych Involvement: No (comment)  Admission diagnosis:  Acute ischemic stroke [facility]) [I63.9] Patient Active Problem List   Diagnosis Date Noted   Acute respiratory failure with hypoxia (HCC)    ESRD (end stage renal disease) on dialysis ([place])    Secondary hypertension    Ischemic cardiomyopathy    Type 2 diabetes mellitus without complication ([place])    Acute ischemic stroke ([place]) 09/14/2021   NSTEMI (non-ST elevated myocardial infarction) ([place]) 08/16/2021   COVID-19 virus infection 08/16/2021   Anemia due to chronic kidney disease 08/16/2021   Dyspepsia 07/01/2021   Long term (current) use of insulin ([place]) 07/01/2021   Vitamin B12 deficiency 07/01/2021   Vitamin D deficiency 07/01/2021   Above knee amputation of left lower extremity ([place]) 06/29/2021   Hyperglycemia due to type 2 diabetes mellitus ([place]) 06/29/2021   Hyperhomocysteinemia ([place]) 06/29/2021   Recurrent UTI 01/12/2021   Critical lower limb ischemia ([place]) 02/07/2020   Pleural effusion, bilateral 02/26/2019   History of non-ST elevation myocardial infarction (NSTEMI) 09/15/2018   Goals of care, counseling/discussion    Palliative care by specialist    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[facility])    Insulin-requiring or dependent type II diabetes mellitus ([place]) 01/11/2018   CAD in native artery    History of TIA (transient ischemic  attack) 05/14/2015   Chronic combined systolic and diastolic heart failure ([place]) 05/14/2015   Hyperkalemia    Essential hypertension    Hyperlipidemia LDL goal <70    Anemia 05/10/2012   Anxiety 05/09/2012   Peripheral vascular disease ([place])    PCP:  [name], MD Pharmacy:   [facility] [phone], [place] [address] AT [facility] [address] [place] [address] Phone: [phone] Fax: [phone]  [facility] [phone] - [place], [place] [facility] [address] AT [place] [address] Phone: [phone] Fax: [phone]  [facility] [address] [address] Phone: [phone] Fax: [phone]     Social Determinants of Health (SDOH) Interventions    Readmission Risk Interventions Readmission Risk Prevention Plan 09/27/2021 08/19/2021  Transportation Screening Complete Complete  PCP or Specialist Appt within 3-5 Days - Complete  HRI or [place] - Complete  Social Work Consult for [place] Planning/Counseling - Complete  Palliative Care Screening - Not Applicable  Medication Review Press photographer) Complete Complete  PCP or Specialist appointment within 3-5 days of discharge Complete -  [place] or Home Care Consult Complete -  SW Recovery Care/Counseling Consult Complete -  Palliative Care Screening Complete -  Skilled Nursing Facility Complete -  Some recent data might be hidden

## 2021-09-27 NOTE — Progress Notes (Signed)
Physical Therapy Treatment Patient Details Name: Jenna Brown MRN: [id number] DOB: 07-22-1951 Today's Date: 09/27/2021   History of Present Illness 71 yo female presents to [facility] on 12/27 with L hemiparesis, slurred speech. 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 Of note,"Patient has atlantodental instability with cord compression and recommend minimizing cervical manipulation especially neck extension; may benefit from neurosurgical evaluation". ETT 12/27-1/7. PMH includes recent hospitalization with covid and possible STEMI, end-stage renal disease on hemodialysis, NSTEMI, CAD, HTN, HLD, right AKA 2013, history of TIA/stroke, and type 2 diabetes mellitus.    PT Comments    Pt awake with RN and NT at bedside performing pericare. Pt A&Ox3, pleasant but restless. Pt tolerated EOB sitting ~10 minutes, limited by fatigue and heavy posterior truncal lean. Pt with elevated SBP 168 EOB, pt asymptomatic. PT continuing to recommend SNF level of care post-acutely, will continue to follow and progress mobility as able.    Recommendations for follow up therapy are one component of a multi-disciplinary discharge planning process, led by the attending physician.  Recommendations may be updated based on patient status, additional functional criteria and insurance authorization.  Follow Up Recommendations  Skilled nursing-short term rehab (<3 hours/day)     Assistance Recommended at Discharge Frequent or constant Supervision/Assistance  Patient can return home with the following Two people to help with walking and/or transfers;Two people to help with bathing/dressing/bathroom;Assistance with cooking/housework;Assistance with feeding;Assist for transportation;Direct supervision/assist for financial management;Direct supervision/assist for medications management;Help with stairs or ramp for entrance    Equipment Recommendations  Hospital bed    Recommendations for Other Services       Precautions / Restrictions Precautions Precautions: Fall Precaution Comments: bilat mitts Restrictions Weight Bearing Restrictions: No     Mobility  Bed Mobility Overal bed mobility: Needs Assistance Bed Mobility: Supine to Sit;Sit to Supine     Supine to sit: Max assist Sit to supine: Max assist   General bed mobility comments: assist for trunk and LE management, scooting to/from EOB, correcting posterior bias at EOB, boost up in bed with use of trendelenberg.    Transfers                   General transfer comment: nt    Ambulation/Gait                   Stairs             Wheelchair Mobility    Modified Rankin (Stroke Patients Only) Modified Rankin (Stroke Patients Only) Pre-Morbid Rankin Score: Moderately severe disability Modified Rankin: Severe disability     Balance Overall balance assessment: Needs assistance Sitting-balance support: No upper extremity supported Sitting balance-Leahy Scale: Poor Sitting balance - Comments: heavy posterior leaning, requiring min-mod PT assist once pt's R hand on bedrail Postural control: Posterior lean                                  Cognition Arousal/Alertness: Awake/alert Behavior During Therapy: Restless Overall Cognitive Status: Impaired/Different from baseline Area of Impairment: Following commands;Orientation;Memory;Safety/judgement;Problem solving                 Orientation Level: Disoriented to;Time   Memory: Decreased short-term memory Following Commands: Follows one step commands with increased time Safety/Judgement: Decreased awareness of deficits;Decreased awareness of safety   Problem Solving: Slow processing;Requires verbal cues;Requires tactile cues  General Comments: initially disoriented to time, states year is 2022 but then corrects to 2023. Pt following one-step  commands with increased time and occasional tactile cuing. Pt pleasant, can be restless when reaching for lines/leads.        Exercises General Exercises - Lower Extremity Long Arc Quad: AAROM;Left;Seated;5 reps    General Comments General comments (skin integrity, edema, etc.): SBP 169 once sitting EOB, returned to supine after 10 minutes EOB sitting      Pertinent Vitals/Pain Pain Assessment: Faces Faces Pain Scale: Hurts little more Pain Location: buttocks Pain Descriptors / Indicators: Sore;Discomfort Pain Intervention(s): Limited activity within patient's tolerance;Monitored during session;Repositioned    Home Living                          Prior Function            PT Goals (current goals can now be found in the care plan section) Acute Rehab PT Goals Patient Stated Goal: unable to state PT Goal Formulation: Patient unable to participate in goal setting Time For Goal Achievement: 10/05/21 Potential to Achieve Goals: Fair Progress towards PT goals: Progressing toward goals    Frequency    Min 3X/week      PT Plan Current plan remains appropriate    Co-evaluation              AM-PAC PT "6 Clicks" Mobility   Outcome Measure  Help needed turning from your back to your side while in a flat bed without using bedrails?: Total Help needed moving from lying on your back to sitting on the side of a flat bed without using bedrails?: Total Help needed moving to and from a bed to a chair (including a wheelchair)?: Total Help needed standing up from a chair using your arms (e.g., wheelchair or bedside chair)?: Total Help needed to walk in hospital room?: Total Help needed climbing 3-5 steps with a railing? : Total 6 Click Score: 6    End of Session Equipment Utilized During Treatment: Oxygen Activity Tolerance: Patient tolerated treatment well Patient left: in bed;with call bell/phone within reach;with restraints reapplied Nurse Communication:  Mobility status PT Visit Diagnosis: Hemiplegia and hemiparesis Hemiplegia - Right/Left: Left Hemiplegia - dominant/non-dominant: Non-dominant Hemiplegia - caused by: Cerebral infarction     Time: 0932-1000 PT Time Calculation (min) (ACUTE ONLY): 28 min  Charges:  $Therapeutic Activity: 8-22 mins $Neuromuscular Re-education: 8-22 mins                     Stacie Glaze, PT DPT Acute Rehabilitation Services Pager [phone]  Office [phone]    Roxine Caddy E [name] 09/27/2021, 11:46 AM

## 2021-09-27 NOTE — Progress Notes (Signed)
Speech Language Pathology Treatment: Dysphagia;Cognitive-Linquistic  Patient Details Name: Jenna Brown MRN: [id number] DOB: [birth year] Today's Date: 09/27/2021 Time: 1000-1015 SLP Time Calculation (min) (ACUTE ONLY): 15 min  Assessment / Plan / Recommendation Clinical Impression  Pt demonstrates improved vocal quality and arousal today. She is still quite distractible and needs cues to sustain attention to task, direct gaze to left visual field, multiple attempts needed for one step commands at times. Pt unwilling to take more than very tiny sips of water and bites of puree. Her daughter is going to bring her dentures in later today, which may make pt more comfortable. Difficult to determine risk of aspiration subjectively at this time given pts unwillingness to take more than a very small amount of PO. Will officially initiate dys 1/thin liquids and SLP will f/u for tolerance. May need MBS if signs of aspiration arise.   HPI HPI: Pt is a 71 yo female who presented on 12/27 with L hemiparesis, slurred speech. CT head with small amount of subarachnoid hyperdensity in posterior right hemisphere, Contrast suspected. MRI CSP moderate to severe spinal canal stenosis MRI/A head Acute infarcts right MCA and PCA territory, most prominent in the right temporal lobe and right occipital lobe. Pt s/p diagnostic cerebral angiogram and mechanical thrombectomy on 09/14/21. ETT 12/27-12/7. Cortrak placed 1/6. PMH: recent hospitalization with COVID and possible STEMI, ESRD on HD, NSTEMI, CAD, HTN, HLD, right AKA 2013, TIA/stroke, GERD, and type 2 diabetes mellitus.      SLP Plan  Continue with current plan of care      Recommendations for follow up therapy are one component of a multi-disciplinary discharge planning process, led by the attending physician.  Recommendations may be updated based on patient status, additional functional criteria and insurance authorization.    Recommendations  Diet  recommendations: Thin liquid;Dysphagia 3 (mechanical soft);Dysphagia 1 (puree) Liquids provided via: Cup;Straw Medication Administration: Via alternative means Supervision: Patient able to self feed;Staff to assist with self feeding;Full supervision/cueing for compensatory strategies Compensations: Slow rate;Small sips/bites Postural Changes and/or Swallow Maneuvers: Seated upright 90 degrees                Follow Up Recommendations: Skilled nursing-short term rehab (<3 hours/day) Assistance recommended at discharge: Frequent or constant Supervision/Assistance SLP Visit Diagnosis: Cognitive communication deficit (R41.841);Dysarthria and anarthria (R47.1) Plan: Continue with current plan of care           Kerri Asche, Katherene Ponto  09/27/2021, 11:55 AM

## 2021-09-27 NOTE — Progress Notes (Signed)
[facility] Progress Note Patient Name: MILIANA GANGWER DOB: Jun 06, 1951 MRN: [id number]   Date of Service  09/27/2021  HPI/Events of Note  Per Neurology, okay to start Heparin gtt without a bolus as patient is  > 9 days s/p  CVA.  eICU Interventions  Heparin gtt (stroke protocol) started without a  bolus, serial  neurologic assessment.        Kerry Kass Kiaan Overholser 09/27/2021, 11:33 PM

## 2021-09-27 NOTE — Consult Note (Signed)
Cardiology Consultation:   Patient ID: Jenna Brown MRN: [id number]; DOB: [birth year]  Admit date: 09/14/2021 Date of Consult: 09/28/2021  PCP:  [name], MD   [facility] Providers Cardiologist:  Skeet Latch, MD  Advanced Heart Failure:  Glori Bickers, MD       Patient Profile:   Jenna Brown is a 71 y.o. female with a hx of chronic systolic and diastolic heart failure, severe CAD, not a candidate for CABG, ESRD on HD (MWF) s/p R AKA, hypertension, DM and recent admission for COVID-19 infection (treated with monotherapy as an outpatient) who is being seen 09/28/2021 for the evaluation of chest pain at the request of Dr. Lucile Shutters.  History of Present Illness:   [name] was admitted on 09/14/21 with c/o left-sided weakness, left-facial droop, and with slurred speech / groaning vocalizations and found to have acute ischemic/embolic stroke right MCA and PCA with R ICA occlusion now status post mechanical thrombectomy on 09/14/21. Patient was extubated on 1/7.   When I see the patient, she is alert and awake, however slightly drowsy and denies any chest pain to me. Limited medical history was obtained from medical chart and staff. Of note, patient complained of 10/10 anterior chest pain to the primary team earlier tonight. She received one dose of sl nitro and ASA 384m per the team. Currently, she denies chest discomfort, heart palpitations, dyspnea, nausea, vomiting, diaphoresis or upper abdominal pain.  Troponin 11,257, EKG_0 :54 demonstrated sinus rhythm PVC, STD inferolateral leads.     Past Medical History:  Diagnosis Date   Anemia    Anxiety    takes Xanax prn   Arthritis    Cataract    left eye   CKD (chronic kidney disease), stage IV (HCC)    Coronary artery disease 2016   cath w/ 90% LAD, 95%CFX, 80% OM 2, 60% RCA, not CABG candidate, rx medically   Full dentures    GERD (gastroesophageal reflux disease)    H/O hiatal hernia    Headache(784.0)     when b/p is elevated   Hemorrhoids    Hx of AKA (above knee amputation), right (HCC)    Hyperlipidemia    takes Simvastatin daily   Hypertension    takes Amlodipine/HCTZ/Losartan daily   Migraines    [place] 1990[place]   [place]STEMI (non-ST elevated myocardial infarction) [place]    Peripheral vascular disease (HCC)    PONV (postoperative nausea and vomiting)    Stroke ([place]    TIA history   Type II diabetes mellitus ([place]    on Lantus   Vertigo    takes Ativert prn    Past Surgical History:  Procedure Laterality Date   ABDOMINAL AORTAGRAM N/A 02/29/2012   Procedure: ABDOMINAL AMaxcine Ham  Surgeon: VSerafina Mitchell MD;  [facility];  Service: Cardiovascular;  Laterality: N/A;   ABDOMINAL AORTOGRAM W/LOWER EXTREMITY N/A 02/11/2020   Procedure: ABDOMINAL AORTOGRAM W/LOWER EXTREMITY;  Surgeon: BSerafina Mitchell MD;  [place] [place];  Service: Cardiovascular;  Laterality: N/A;   ABDOMINAL AORTOGRAM W/LOWER EXTREMITY N/A 03/17/2020   Procedure: ABDOMINAL AORTOGRAM W/LOWER EXTREMITY;  Surgeon: BSerafina Mitchell MD;  [place];  Service: Cardiovascular;  Laterality: N/A;   AMPUTATION  05/10/2012   Procedure: AMPUTATION ABOVE KNEE;  Surgeon: VSerafina Mitchell MD;  [facility];  Service: Vascular;  Laterality: Right;   open right groin wound noted   aortogram  02/2012   AV FISTULA PLACEMENT Right  03/04/2019   Procedure: ARTERIOVENOUS (AV) FISTULA CREATION;  Surgeon: Rosetta Posner, MD;  Location: [facility] OR;  Service: Vascular;  Laterality: Right;   CARDIAC CATHETERIZATION N/A 04/13/2015   Procedure: Right/Left Heart Cath and Coronary Angiography;  Surgeon: [name], MD;  Location: [place] [facility] LAB;  Service: Cardiovascular;  Laterality: N/A;   CLEFT PALATE REPAIR     several   COLONOSCOPY     DILATION AND CURETTAGE OF UTERUS     FEMORAL-POPLITEAL BYPASS GRAFT  04/05/2012   Procedure: BYPASS GRAFT FEMORAL-POPLITEAL ARTERY;  Surgeon:  [name], MD;  Location: [place];  Service: Vascular;  Laterality: Right;  REVISION   FEMORAL-TIBIAL BYPASS GRAFT  03/29/2012   Procedure: BYPASS GRAFT FEMORAL-TIBIAL ARTERY;  Surgeon: [name], MD;  [facility];  Service: Vascular;  Laterality: Right;  Right femoral to Posterior Tibialis with composite graft of 70m x 80 cm ringed gortex graft and saphenous vein  ,intraoperative arteriogram   FEMOROPOPLITEAL THROMBECTOMY / EMBOLECTOMY  05/09/2012   IR CT HEAD LTD  09/14/2021   IR FLUORO GUIDE CV LINE RIGHT  03/01/2019   IR PERCUTANEOUS ART THROMBECTOMY/INFUSION INTRACRANIAL INC DIAG ANGIO  09/14/2021   IR [place] ACCESS LEFT  09/14/2021   IR [place] VASC ACCESS RIGHT  03/01/2019   MULTIPLE TOOTH EXTRACTIONS     MYOMECTOMY     PERIPHERAL VASCULAR INTERVENTION Left 03/17/2020   Procedure: PERIPHERAL VASCULAR INTERVENTION;  Surgeon: BSerafina Mitchell MD;  [place];  Service: Cardiovascular;  Laterality: Left;  popliteal   PR VEIN BYPASS GRAFT,AORTO-FEM-POP  05/27/11   Right SFA-Below knee Pop BP   RADIOLOGY WITH ANESTHESIA N/A 09/14/2021   Procedure: IR WITH ANESTHESIA;  Surgeon: Radiologist, Medication, MD;  [place]  Service: Radiology;  Laterality: N/A;   REVISION OF ARTERIOVENOUS GORETEX GRAFT Right 08/21/2019   Procedure: REVISION OF ARTERIOVENOUS FISTULA RIGHT ARM;  Surgeon: ERosetta Posner MD;  [facility];  Service: Vascular;  Laterality: Right;   TEE WITHOUT CARDIOVERSION N/A 01/03/2017   Procedure: TRANSESOPHAGEAL ECHOCARDIOGRAM (TEE);  Surgeon: TSkeet Latch MD;  [place]  Service: Cardiovascular;  Laterality: N/A;   THORACENTESIS  02/27/2019       TONSILLECTOMY     TUBAL LIGATION  ~ 1990     Inpatient Medications: Scheduled Meds:  aspirin  325 mg Per Tube Daily   Or   aspirin  300 mg Rectal Daily   atorvastatin  80 mg Per Tube Daily   chlorhexidine gluconate (MEDLINE KIT)  15 mL Mouth Rinse BID   Chlorhexidine  Gluconate Cloth  6 each Topical Q0600   Chlorhexidine Gluconate Cloth  6 each Topical Q0600   darbepoetin (ARANESP) injection - NON-DIALYSIS  100 mcg Subcutaneous Q Fri-1800   doxercalciferol  2.5 mcg Intravenous Q M,W,F-HD   ezetimibe  10 mg Per Tube Daily   feeding supplement (NEPRO CARB STEADY)  1,000 mL Per Tube Q24H   feeding supplement (PROSource TF)  45 mL Per Tube BID   heparin injection (subcutaneous)  5,000 Units Subcutaneous Q8H   insulin aspart  0-15 Units Subcutaneous Q4H   insulin aspart  4 Units Subcutaneous Q4H   insulin glargine-yfgn  20 Units Subcutaneous BID   ipratropium-albuterol  3 mL Nebulization Q6H   mouth rinse  15 mL Mouth Rinse 10 times per day   midodrine  5 mg Per Tube TID WC   nitroGLYCERIN  1 inch Topical Q6H   pantoprazole  sodium  40 mg Per Tube QHS   sevelamer carbonate  0.8 g Oral TID WC   Continuous Infusions:  sodium chloride Stopped (09/21/21 1234)   sodium chloride     piperacillin-tazobactam (ZOSYN)  IV Stopped (09/27/21 2005)   PRN Meds: acetaminophen **OR** acetaminophen (TYLENOL) oral liquid 160 mg/5 mL **OR** acetaminophen, fentaNYL (SUBLIMAZE) injection, guaiFENesin, nitroGLYCERIN, ondansetron (ZOFRAN) IV  Allergies:    Allergies  Allergen Reactions   Plavix [Clopidogrel Bisulfate] Itching   Codeine Other (See Comments)    "makes me feel strange"   Hydrocodone Other (See Comments)    Feels disorinated   Losartan Other (See Comments)   Tylenol With Codeine #3 [Acetaminophen-Codeine] Other (See Comments)    "doesn't make me feel right"    Social History:   Social History   Socioeconomic History   Marital status: Single    Spouse name: Not on file   Number of children: Not on file   Years of education: Not on file   Highest education level: Not on file  Occupational History   Occupation: disabled  Tobacco Use   Smoking status: Former    Packs/day: 0.25    Years: 40.00    Pack years: 10.00    Types: Cigarettes    Quit  date: 02/27/2012    Years since quitting: 9.5   Smokeless tobacco: Never  Vaping Use   Vaping Use: Never used  Substance and Sexual Activity   Alcohol use: No   Drug use: No   Sexual activity: Never    Birth control/protection: Post-menopausal  Other Topics Concern   Not on file  Social History Narrative   She lives in [place] with family.   Social Determinants of Health   Financial Resource Strain: Not on file  Food Insecurity: Not on file  Transportation Needs: Not on file  Physical Activity: Not on file  Stress: Not on file  Social Connections: Not on file  Intimate Partner Violence: Not on file    Family History:    Family History  Problem Relation Age of Onset   Cancer Mother        breast   Diabetes Father    Cancer Brother    Heart attack Maternal Grandmother    Heart attack Maternal Grandfather      ROS:  Please see the history of present illness.   All other ROS reviewed and negative.     Physical Exam/Data:   Vitals:   09/27/21 2100 09/27/21 2200 09/27/21 2300 09/28/21 0000  BP: (!) 151/71 (!) 160/70 (!) 152/66   Pulse: (!) 108 99 (!) 104   Resp: (!) 33 (!) 37 (!) 30   Temp:      TempSrc:      SpO2: 98% 97% 98%   Weight:    75.5 kg  Height:    _0  (1.626 m)    Intake/Output Summary (Last 24 hours) at 09/28/2021 0005 Last data filed at 09/27/2021 2200 Gross per 24 hour  Intake 150.03 ml  Output --  Net 150.03 ml   Last 3 Weights 09/28/2021 09/27/2021 09/26/2021  Weight (lbs) 166 lb 7.2 oz 166 lb 7.2 oz 165 lb 12.6 oz  Weight (kg) 75.5 kg 75.5 kg 75.2 kg     Body mass index is 28.57 kg/m.  General:  Well nourished, well developed, in no acute distress HEENT: normal Neck: no JVD Vascular: No carotid bruits; Distal pulses 2+ bilaterally Cardiac:  regular, tachcyhcardia, normal S1, S2; RRR; no murmur  Lungs:  on [place] O2, decreased breath sounds Abd: soft, nontender, no hepatomegaly  Ext: no edema Musculoskeletal:  s/p R AKA Skin: warm and  dry  Neuro:  left facial droop, dysarthria, seems to move all extremities freely with reduced strength on the left side Psych:  Normal affect    Relevant CV Studies: TTE 09/15/21  1. Apical, mid-apical anterior, mid-apical anteroseptal, mid-apical  inferoseptal, mid-apical inferior, and mid-apical inferolateral akinesis.  Otherwise global hypokinesis. Left ventricular ejection fraction, by  estimation, is 25 to 30%. The left  ventricle has severely decreased function. The left ventricle demonstrates  regional wall motion abnormalities (see scoring diagram/findings for  description). The left ventricular internal cavity size was mildly  dilated. There is mild left ventricular  hypertrophy of the basal-septal segment. Left ventricular diastolic  parameters are consistent with Grade I diastolic dysfunction (impaired  relaxation).   2. Right ventricular systolic function is normal. The right ventricular  size is normal.   3. Left atrial size was mildly dilated.   4. The mitral valve is normal in structure. No evidence of mitral valve  regurgitation. No evidence of mitral stenosis. Moderate mitral annular  calcification.   5. The aortic valve is tricuspid. There is mild calcification of the  aortic valve. There is mild thickening of the aortic valve. Aortic valve  regurgitation is not visualized. No aortic stenosis is present.   6. The inferior vena cava is normal in size with <50% respiratory  variability, suggesting right atrial pressure of 8 mmHg.   Laboratory Data:  High Sensitivity Troponin:   Recent Labs  Lab 09/27/21 2134  TROPONINIHS 11,257*     Chemistry Recent Labs  Lab 09/25/21 0437 09/26/21 0450 09/27/21 0513  NA 133* 137 138  K 5.1 3.8 4.4  CL 95* 94* 93*  CO2 21* 21* 21*  GLUCOSE 288* 100* 190*  BUN 76* 104* 129*  CREATININE 4.96* 6.54* 7.71*  CALCIUM 8.8* 8.7* 8.6*  GFRNONAA 9* 6* 5*  ANIONGAP 17* 22* 24*    No results for input(s): PROT, ALBUMIN, AST,  ALT, ALKPHOS, BILITOT in the last 168 hours. Lipids No results for input(s): CHOL, TRIG, HDL, LABVLDL, LDLCALC, CHOLHDL in the last 168 hours.  Hematology Recent Labs  Lab 09/25/21 0640 09/26/21 0450 09/27/21 0513  WBC 28.3* 37.3* 27.8*  RBC 2.57* 2.82* 2.63*  HGB 7.6* 8.3* 7.8*  HCT 24.6* 27.3* 25.4*  MCV 95.7 96.8 96.6  MCH 29.6 29.4 29.7  MCHC 30.9 30.4 30.7  RDW 15.3 15.5 15.7*  PLT 217 306 257   Thyroid No results for input(s): TSH, FREET4 in the last 168 hours.  BNPNo results for input(s): BNP, PROBNP in the last 168 hours.  DDimer No results for input(s): DDIMER in the last 168 hours.   Radiology/Studies:  DG CHEST PORT 1 VIEW  Result Date: 09/26/2021 CLINICAL DATA:  Acute respiratory failure. EXAM: PORTABLE CHEST 1 VIEW COMPARISON:  09/23/2021 and older exams.  Chest CT, 09/22/2021. FINDINGS: Lung volumes remain low. There is left greater than right lung base opacity consistent with a combination of atelectasis and small effusions. Remainder of the lungs is clear. No pneumothorax. New enteric tube passes below the diaphragm, well into the stomach and below the included field of view. Previous NG tube and ET tube have been removed. IMPRESSION: 1. No significant change in lung aeration. Stable, left greater than right, lung base opacities, consistent with atelectasis and small effusions, accentuated by low lung volumes. 2. Status post extubation. Electronically Signed  By: Lajean Manes M.D.   On: 09/26/2021 13:33   DG Abd Portable 1V  Result Date: 09/24/2021 CLINICAL DATA:  Enteric catheter placement EXAM: PORTABLE ABDOMEN - 1 VIEW COMPARISON:  09/14/2021 FINDINGS: Frontal view of the abdomen and pelvis was obtained, excluding the lower pelvis and right flank by collimation. Enteric catheter passes below diaphragm tip overlying the gastric antrum. Nonspecific gaseous distention of the bowel again noted. Evaluation limited by patient motion. IMPRESSION: 1. Enteric catheter tip  projecting over gastric antrum. Electronically Signed   By: Randa Ngo M.D.   On: 09/24/2021 15:22     Assessment and Plan:   # NSTEMI -Patient developed NSTEMI tonight in the setting of recent acute ischemic stroke. Fortunately, she is currently chest pain free and resting comfortably. Her EKG tonight demonstrated STD inferolateral leads.  -Patient is known to have MV-CAD. She had multiple admissions in the past for NSTEMI and was determined to be a poor candidate for revascularization and recommended medical therapy.  -continue Telemetry -EKG prn -trend troponin until peak -Her recent TTE demonstrated LVEF 20-25% with WMA consistent with MV-CAD. -spoke with on call neurologist Dr. Rory Percy and he is OK with heparin gtt. -continue ASA and high dose statin -start heparin gtt -start metoprolol tartrate 12.73m bid if there is no contraindication. -will discuss with neurology daytime in terms of DAPT -we will continue to follow  Risk Assessment/Risk Scores:     TIMI Risk Score for Unstable Angina or Non-ST Elevation MI:   The patient's TIMI risk score is 6, which indicates a 41% risk of all cause mortality, new or recurrent myocardial infarction or need for urgent revascularization in the next 14 days.    For questions or updates, please contact CBeldingPlease consult [email or web address] for contact info under    Signed, FLaurice Record MD  09/28/2021 12:05 AM

## 2021-09-27 NOTE — Progress Notes (Signed)
NAME:  Jenna Brown, MRN:  [id number], DOB:  08/02/1951, LOS: 72 ADMISSION DATE:  09/14/2021, CONSULTATION DATE:  09/14/2021 REFERRING MD: Dr. Debbrah Alar CHIEF COMPLAINT: Acute R ICA stroke s/p thrombectomy  History of Present Illness:  71 year old woman who [facility]/27 with acute R ICA stroke. S/p mechanical thrombectomy 12/28. PMHx significant for HTN, HLD, T2DM, CAD, PVD (s/p R AKA), ESRD on HD. Of note, recent admission for COVID-19 infection (treated with monotherapy as an outpatient); she then developed chest pain with concern for possible NSTEMI.  PCCM consulted for help with vent management post-thrombectomy.  Pertinent Medical History:    has a past medical history of Anemia, Anxiety, Arthritis, Cataract, CKD (chronic kidney disease), stage IV ([place]), Coronary artery disease (2016), Full dentures, GERD (gastroesophageal reflux disease), H/O hiatal hernia, Headache(784.0), Hemorrhoids, AKA (above knee amputation), right ([place]), Hyperlipidemia, Hypertension, Migraines, Myocardial infarction ([place]) (1990's), NSTEMI (non-ST elevated myocardial infarction) ([place]), Peripheral vascular disease ([place]), PONV (postoperative nausea and vomiting), Stroke ([place]), Type II diabetes mellitus ([place]), and Vertigo.   Significant Hospital Events: Including procedures, antibiotic start and stop dates in addition to other pertinent events   12/27 PCCM consult 12/28 CT head with small amount of subarachnoid hyperdensity in posterior right hemisphere, Contrast suspected. MRI CSP moderate to severe spinal canal stenosis MRI/A head Acute infarcts right MCA and PCA territory, most prominent in the right temporal lobe and right occipital lobe.  ECHO EF 25-30%, global hypokinesis, Grade I DD, RVSF normal 12/31 low blood pressure while on dialysis last night.  Was briefly on pressors.  Continues to fail pressure support weans due to desats. Started cefepime 1/3 Failing vent wean 2/2 desaturations. PEEP increased  to 8 (5). HD today per Nephro, longer session given K 6.2. CXR stable. 1/4: received HD; required Neo due to hypotension 1/5: wbc rising; broadened abx to vanc and zosyn.  No cuff leak started on Decadron 1/7 Extubated 1/8 Loose stools. Flexi placed. C. Dif negative   Interim History / Subjective:   No acute events overnight.   Objective:  Blood pressure (!) 146/85, pulse (!) 101, temperature 99.3 F (37.4 C), temperature source Axillary, resp. rate (!) 33, height 5\' 4"  (1.626 m), weight 75.5 kg, SpO2 95 %.        Intake/Output Summary (Last 24 hours) at 09/27/2021 0754 Last data filed at 09/26/2021 2300 Gross per 24 hour  Intake 906.6 ml  Output --  Net 906.6 ml    Filed Weights   09/25/21 0500 09/26/21 0500 09/27/21 0500  Weight: 75 kg 75.2 kg 75.5 kg   Physical Examination: Gen:      Elderly appearing female in NAD HEENT:  [place]/AT, PERRL Neck:   No masses, no JVD Lungs:    Clear bilateral breath sounds. No distress.  CV:         RRR, no MRG Abd:    Hyperactive bowel sounds. Soft, non-tender.  Ext:    R AKA remote. No edema Skin:      Grosslyt intact. Warm and dry Neuro:  Alert, oriented. Moving all extremities to command. R>L  Labs sig for: BUN 129, Creatinine 7.7, Anion gap 24,  WBC 27.8, Hemoglobin 7.8  Resolved Hospital Problem List:  Hyponatremia: improved Acute respiratory failure with hypoxia  Assessment & Plan:  Acute ischemic stroke with occlusion of proximal right internal carotid S/p mechanical thrombectomy, (TICI 3), CT head 12/28 with small amount of subarachnoid hyperdensity in posterior right hemisphere, Contrast suspected. 12/28 MRI CSP moderate to severe  spinal canal stenosis MRI/A head acute infarcts right MCA and PCA territory, most prominent in the right temporal lobe and right occipital lobe. P: Stroke service primary Continue aspirin, statin No anticoagulation due to large nature of stroke  KLEBSIELLA ORNITHINOLYTICA pneumonia: 1/2  ceftriaxone<<1/5 broadened abx to zosyn and vanc due to worsening fever/leukocytosis.  COVID-19 Positive- asymptomatic Small LL pleural effusion P: Stable post extubation. Wean down o2 as tolerated.  Continue Zosyn, DC vanco  Intermittent hypotension P: Continue midodrine, as needed Levophed for hemodialysis next  End-stage renal disease on hemodialysis (MWF) Hyperkalemia P: Hemodialysis per nephrology  Severe ischemic cardiomyopathy HX HTN HX HLD 12/29 ECHO EF 25-30%, global hypokinesis, Grade I DD, RVSF normal. Remains off pressors. P: -BB on hold due to hypotension -ASA, statin, zetia -telemetry monitoring  DM2 P: Continue SSI and basal insulin Continue tube feed coverage CBG monitoring  GERD P: PPI  Anemia of chronic illness P: Trend CBC Likely in the setting of ESRD, Aranesp  Best Practice: (right click and "Reselect all SmartList Selections" daily)   Diet/type: tubefeeds DVT prophylaxis: SCD GI prophylaxis: PPI Lines: N/A Foley:  N/A Code Status:  full code Last date of multidisciplinary goals of care discussion: Full code  Critical care time:      Georgann Housekeeper, AGACNP-BC [place] for personal pager PCCM on call pager [phone] until 7pm. Please call Elink 7p-7a. [phone]  09/27/2021 8:13 AM

## 2021-09-27 NOTE — Progress Notes (Signed)
[facility] Progress Note Patient Name: Jenna Brown DOB: [birth year] MRN: [id number]   Date of Service  09/27/2021  HPI/Events of Note  I paged the covering cardiology group x 2 without receiving a call back, so I contacted the in-house cardiology fellow and requested that he evaluate the patient, he indicated that he will contact the covering cardiology group directly. I followed up with the patient post-NTG and other interventions ordered by me, she currently is denying chest pain.  eICU Interventions  Awaiting cardiology evaluation.        Kerry Kass Alga [place]all 09/27/2021, 10:22 PM

## 2021-09-27 NOTE — Progress Notes (Signed)
eLink Physician-Brief Progress Note Patient Name: Jenna Brown DOB: 10/18/1950 MRN: [id number]   Date of Service  09/27/2021  HPI/Events of Note  Patient with 10/10 anterior chest pain and antero-lateral ST changes on 12 lead EKG suggestive of NSTEMI.  eICU Interventions  Aspirin 325 mg po now, SL NTG 0.4 mg stat and Q 5 minutes PRN chest pain, Fentanyl 12.5-25 mcg iv Q 1 hour PRN pain (she has a Morphine allergy), Nitropaste 1 inch to the chest Q 6 hours, patient is s/p recent CVA relatively contraindicating Heparin gtt, stat cardiology consultation, serial Troponin.        Kerry Kass Maciej Schweitzer 09/27/2021, 9:18 PM

## 2021-09-27 NOTE — Progress Notes (Signed)
2050: pt complaining of 10/10 chest pain, holding her chest saying "it feels heavy and I cant breathe"  EKG obtained and eLink notified. New orders for aspirin, SL nitroglycerin and fentanyl prn. Nitroglycerin given and pt notes relief in her chest pain.   RN will continue to monitor.

## 2021-09-27 NOTE — Progress Notes (Signed)
ANTICOAGULATION CONSULT NOTE - Initial Consult  Pharmacy Consult for heparin Indication:  concern for ACS in setting of recent stroke  Allergies  Allergen Reactions   Plavix [Clopidogrel Bisulfate] Itching   Codeine Other (See Comments)    "makes me feel strange"   Hydrocodone Other (See Comments)    Feels disorinated   Losartan Other (See Comments)   Tylenol With Codeine #3 [Acetaminophen-Codeine] Other (See Comments)    "doesn't make me feel right"    Patient Measurements: Height: _0  (162.6 cm) Weight: 75.5 kg (166 lb 7.2 oz) IBW/kg (Calculated) : 54.7 Heparin Dosing Weight: 70kg  Vital Signs: Temp: 97.5 F (36.4 C) (01/09 2000) Temp Source: Axillary (01/09 2000) BP: 152/66 (01/09 2300) Pulse Rate: 104 (01/09 2300)  Labs: Recent Labs    09/25/21 0437 09/25/21 0640 09/25/21 0640 09/26/21 0450 09/27/21 0513 09/27/21 2134  HGB  --  7.6*   < > 8.3* 7.8*  --   HCT  --  24.6*  --  27.3* 25.4*  --   PLT  --  217  --  306 257  --   CREATININE 4.96*  --   --  6.54* 7.71*  --   TROPONINIHS  --   --   --   --   --  11,257*   < > = values in this interval not displayed.    Estimated Creatinine Clearance: 6.9 mL/min (A) (by C-G formula based on SCr of 7.71 mg/dL (H)).   Medical History: Past Medical History:  Diagnosis Date   Anemia    Anxiety    takes Xanax prn   Arthritis    Cataract    left eye   CKD (chronic kidney disease), stage IV ([place])    Coronary artery disease 2016   cath w/ 90% LAD, 95%CFX, 80% OM 2, 60% RCA, not CABG candidate, rx medically   Full dentures    GERD (gastroesophageal reflux disease)    H/O hiatal hernia    Headache(784.0)    when b/p is elevated   Hemorrhoids    Hx of AKA (above knee amputation), right (HCC)    Hyperlipidemia    takes Simvastatin daily   Hypertension    takes Amlodipine/HCTZ/Losartan daily   Migraines    Myocardial infarction [facility]) 1990's   NSTEMI (non-ST elevated myocardial infarction) ([place])    Peripheral  vascular disease (HCC)    PONV (postoperative nausea and vomiting)    Stroke ([place])    TIA history   Type II diabetes mellitus ([place])    on Lantus   Vertigo    takes Ativert prn    Medications:  Medications Prior to Admission  Medication Sig Dispense Refill Last Dose   acetaminophen (TYLENOL) 500 MG tablet Take 500 mg by mouth at bedtime.   09/12/2021   aspirin 81 MG chewable tablet Chew 81 mg by mouth daily.   09/13/2021   atorvastatin (LIPITOR) 80 MG tablet Take 80 mg by mouth daily.    09/13/2021   cholecalciferol (VITAMIN D) 25 MCG (1000 UT) tablet Take 1,000 Units by mouth daily.   Past Week   docusate sodium (COLACE) 100 MG capsule Take 100 mg by mouth daily as needed for mild constipation.   Past Week   hydrALAZINE (APRESOLINE) 10 MG tablet Take 1 tablet by mouth three times daily on non-dialysis days 90 tablet 0 09/09/2021   Insulin Glargine (BASAGLAR KWIKPEN) 100 UNIT/ML Inject 20 Units into the skin 2 (two) times daily. Taking 22 units at  bedtime   09/12/2021   isosorbide mononitrate (IMDUR) 30 MG 24 hr tablet Take 1/2 tablet (15 mg total) by mouth daily. 30 tablet 0 09/10/2021   nitroGLYCERIN (NITROSTAT) 0.4 MG SL tablet DISSOLVE 1 TABLET BY MOUTH UNDER THE TONGUE EVERY 5 MINUTES FOR 3 DOSES AS NEEDED FOR CHEST PAIN (Patient taking differently: 0.4 mg every 5 (five) minutes as needed for chest pain.) 25 tablet 5 unk   oxyCODONE-acetaminophen (PERCOCET/ROXICET) 5-325 MG tablet Take 1 tablet by mouth every 8 (eight) hours as needed for severe pain. 6 tablet 0 Past Week   QUEtiapine (SEROQUEL) 50 MG tablet Take 50 mg by mouth 2 (two) times daily.   09/13/2021   RENVELA 800 MG tablet Take 1,600 mg by mouth 3 (three) times daily with meals.   Past Week   zinc sulfate 220 (50 Zn) MG capsule Take 1 capsule (220 mg total) by mouth daily.   Past Week   carvedilol (COREG) 3.125 MG tablet Take 1 tablet (3.125 mg total) by mouth 2 (two) times daily with a meal. 60 tablet 0    ticagrelor  (BRILINTA) 90 MG TABS tablet Take 1 tablet (90 mg total) by mouth 2 (two) times daily. 60 tablet 3    Scheduled:   aspirin  325 mg Per Tube Daily   Or   aspirin  300 mg Rectal Daily   atorvastatin  80 mg Per Tube Daily   chlorhexidine gluconate (MEDLINE KIT)  15 mL Mouth Rinse BID   Chlorhexidine Gluconate Cloth  6 each Topical Q0600   Chlorhexidine Gluconate Cloth  6 each Topical Q0600   darbepoetin (ARANESP) injection - NON-DIALYSIS  100 mcg Subcutaneous Q Fri-1800   doxercalciferol  2.5 mcg Intravenous Q M,W,F-HD   ezetimibe  10 mg Per Tube Daily   feeding supplement (NEPRO CARB STEADY)  1,000 mL Per Tube Q24H   feeding supplement (PROSource TF)  45 mL Per Tube BID   heparin injection (subcutaneous)  5,000 Units Subcutaneous Q8H   insulin aspart  0-15 Units Subcutaneous Q4H   insulin aspart  4 Units Subcutaneous Q4H   insulin glargine-yfgn  20 Units Subcutaneous BID   ipratropium-albuterol  3 mL Nebulization Q6H   mouth rinse  15 mL Mouth Rinse 10 times per day   midodrine  5 mg Per Tube TID WC   nitroGLYCERIN  1 inch Topical Q6H   pantoprazole sodium  40 mg Per Tube QHS   sevelamer carbonate  0.8 g Oral TID WC   Infusions:   sodium chloride Stopped (09/21/21 1234)   sodium chloride     piperacillin-tazobactam (ZOSYN)  IV Stopped (09/27/21 2005)    Assessment: 71yo female admitted 12/27 with stroke, now c/o CP w/ elevated troponin >> to begin heparin.  Goal of Therapy:  Heparin level 0.3-0.5 units/ml Monitor platelets by anticoagulation protocol: Yes   Plan:  Heparin infusion at 800 units/hr. Monitor heparin levels and CBC.  Wynona Neat, PharmD, BCPS  09/27/2021,11:57 PM

## 2021-09-27 NOTE — Progress Notes (Addendum)
STROKE TEAM PROGRESS NOTE   INTERVAL HISTORY No family at the bedside. She is awake alert and oriented and following commands. Remains extubated. Hemodynamically stable. WBC improved to 27.8 from 37.3.   Vitals:   09/27/21 0600 09/27/21 0800 09/27/21 1200 09/27/21 1340  BP: (!) 146/85     Pulse: (!) 101     Resp: (!) 33     Temp:  98.8 F (37.1 C) 98.9 F (37.2 C)   TempSrc:  Oral Axillary   SpO2: 95%   96%  Weight:      Height:       CBC:  Recent Labs  Lab 09/22/21 0416 09/23/21 0436 09/26/21 0450 09/27/21 0513  WBC 15.6*   < > 37.3* 27.8*  NEUTROABS 9.8*  --   --   --   HGB 7.9*   < > 8.3* 7.8*  HCT 27.0*   < > 27.3* 25.4*  MCV 98.2   < > 96.8 96.6  PLT 203   < > 306 257   < > = values in this interval not displayed.    Basic Metabolic Panel:  Recent Labs  Lab 09/24/21 0355 09/25/21 0437 09/26/21 0450 09/27/21 0513  NA 135   < > 137 138  K 5.5*   < > 3.8 4.4  CL 96*   < > 94* 93*  CO2 24   < > 21* 21*  GLUCOSE 292*   < > 100* 190*  BUN 78*   < > 104* 129*  CREATININE 5.79*   < > 6.54* 7.71*  CALCIUM 8.8*   < > 8.7* 8.6*  PHOS 3.6  --  5.1*  --    < > = values in this interval not displayed.    Lipid Panel:  No results for input(s): CHOL, TRIG, HDL, CHOLHDL, VLDL, LDLCALC in the last 168 hours.  HgbA1c:  No results for input(s): HGBA1C in the last 168 hours.  Urine Drug Screen:  No results for input(s): LABOPIA, COCAINSCRNUR, LABBENZ, AMPHETMU, THCU, LABBARB in the last 168 hours.   Alcohol Level  No results for input(s): ETH in the last 168 hours.  IMAGING past 24 hours No results found.  PHYSICAL EXAM  Temp:  [98.8 F (37.1 C)-99.9 F (37.7 C)] 98.9 F (37.2 C) (01/09 1200) Pulse Rate:  [91-107] 101 (01/09 0600) Resp:  [19-38] 33 (01/09 0600) BP: (81-152)/(63-105) 146/85 (01/09 0600) SpO2:  [94 %-97 %] 96 % (01/09 1340) Weight:  [75.5 kg] 75.5 kg (01/09 0500)  General - intubated off sedation, in no acute distress  Cardiovascular -  Regular rate and rhythm.  Neurological: Awake alert and interactive. Oriented to person and place.  Eyes are open and follows simple commands. PERRL, tracking bilaterally, no significant gaze palsy but still has left hemianopia, not blinking to visual threat on the left. Voice is hypophonic and hoarse. Moderate dysarthria and paucity of speech. Will move all extremities in response to commands with at least 4/5 strength in RUE and RLE (right AKA chronic), 2/5 strength in LUE proximal and 3/5 bicep, and LLE 3/5 strength. Sensation and coordination not cooperative, and gait not tested.  ASSESSMENT/PLAN [name] is a 71 y.o. female with history of ESRD on IHD, NSTEMI, CAD, HTN, HLD, right AKA, stroke and T2DM presenting with left sided weakness, left facial droop and slurred speech.  She was brought to the ED.  TNK was not given as she presented outside the window.  CT angiography revealed LVO in the  proximal right ICA with perfusion study showing core infarct of 29cc and penumbra of 168cc in right MCA and PCA territories. Mechanical thrombectomy was performed with TICI 3 flow achieved.  Receiving hemodialysis per nephrology. Extubated 1/7 to [facility]. Remains extubated and doing well from a respiratory standpoint.  Cortrak in place for TF and medications.   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 Bilateral basal ganglia and left cerbellar chronic infarcts, chronic C1-C2 subluxation. ASPECTS 10.    CTA head & neck emergent LVO at proximal right ICA, isolated right M1 segment and fetal type right PCA, left vertebral artery occlusion, stenotic right vertebral artery, high grade mid basilar stenosis CT perfusion 29/197 Status post IR with TICI3 at right ICA.  MRI  acute infarcts in right MCA and PCA territory, punctate foci of restricted diffusion in bilateral forntal and parietal  lobes as well as left occipital lobe MRA  resolution of right ICA occlusion  2D Echo EF 25-30%, no LV thrombus LDL 74 HgbA1c 5.5 UDS negative VTE prophylaxis -Heparin subcu aspirin 81 mg daily and Brilinta (ticagrelor) 90 mg bid prior to admission, now on aspirin 325 mg daily. No AC now given large infarct size.  May consider [facility] later given cardiomyopathy with low EF. Therapy recommendations:  pending Disposition:  pending  Respiratory failure Extubated 1/7 to [facility] Management per CCM Currently tolerating well Consider tracheostomy if reintubated  Elevated WBC Fever WBC 15.6 > 19.1->21.4->28.3->37.3 T-max 100.1-> afebrile with Possible aspiration pneumonia.  Cefepime -> vancomycin and Zosyn managed by CCM.  Cardiomyopathy 07/2021 EF 20 to 25% This admission EF 25 to 30% Home meds including Imdur and Coreg Currently not on home meds due to hypotension Management per CCM No AC now given large infarct size.  May consider [facility] later given low EF and stroke.  Hx of hypertension Hypotension Home meds:  Coreg 3.125 mg BID BP on the low end Off Levophed Long-term BP goal normotensive Continue midodrine 10 TID  Hyperlipidemia Home meds:  atorvastatin 80 mg daily, resumed in hospital LDL 74, goal < 70 Zetia 10 mg daily and atorvastatin 80mg  Continue statin at discharge  Diabetes Hemoglobin A1c 5.5, uncontrolled Hyperglycemia On insulin SSI CBG monitoring Close PCP follow-up for better DM control Diabetic consult.  Other Stroke Risk Factors Advanced Age >/= 52  Former cigarette smoker Hx stroke Coronary artery disease PVD  Other Active Problems ESRD on IHD - Appreciate nephrology recommendations  Right LE AKA Initial CT showed C1-C2 subluxation with anterior spinal cord compression, similar as prior.  MRI C-spine showed diffuse cervical spine spinal canal stenosis with cord flattening but no cord signal abnormality. Anemia due to ESRD, hemoglobin 8.5 > 7.9 >  7.5->7.6->8.3  Hospital day # 13  Patient seen and examined by NP/APP with MD. MD to update note as needed.   Janine Ores, DNP, FNP-BC [facility] Pager: [phone]  ATTENDING NOTE: I reviewed above note and agree with the assessment and plan. Pt was seen and examined.   No family at bedside, patient lying in bed, awake alert, follows simple commands, still has left hemianopia, left hemiplegia, moderate dysarthria, but passed swallow on dysphagia 3 and thin liquid.  No fever, continues on Zosyn, WBC improved to 27.8 from yesterday 37.3.  Continue on HD.  BP on higher end, will decrease midodrine to 5 mg 3 times daily.  For detailed  assessment and plan, please refer to above as I have made changes wherever appropriate.   Rosalin Hawking, MD PhD Stroke Neurology 09/27/2021 7:52 PM  This patient is critically ill due to right MCA and PCA large stroke, respiratory failure, ESRD on dialysis, CHF with low EF, leukocytosis, fever and at significant risk of neurological worsening, death form sepsis, septic shock, recurrent stroke, hemorrhagic transformation, seizure. This patient's care requires constant monitoring of vital signs, hemodynamics, respiratory and cardiac monitoring, review of multiple databases, neurological assessment, discussion with family, other specialists and medical decision making of high complexity. I spent 35 minutes of neurocritical care time in the care of this patient.    To contact Stroke Continuity provider, please refer to [email or web address] After hours, contact General Neurology

## 2021-09-27 NOTE — Progress Notes (Signed)
[facility] °Progress Note ° °Subjective: due for HD today. Has Cortrak in getting TF's. BUN 129  creat 7.7 today.  ° ° °Vitals:  ° 09/27/21 0500 09/27/21 0600 09/27/21 0800 09/27/21 1200  °BP:  (!) 146/85    °Pulse: (!) 105 (!) 101    °Resp: (!) 35 (!) 33    °Temp:   98.8 °F (37.1 °C) 98.9 °F (37.2 °C)  °TempSrc:   Oral Axillary  °SpO2: 96% 95%    °Weight: 75.5 kg     °Height:      ° ° °Physical Exam:   °General elderly female in bed  °Lungs clear but reduced - 96% on 4 liters  °Heart S1S2 no rub °Abdomen soft nontender nondistended °Extremities right AKA and no left leg edema °Neuro - awake and follows simple commands °Psych - has mittens still °Access:  RUA AVG +b/t  °  ° ° Home meds including:  asa, lipitor, hydralazine 10 tid non hd days, insulin glargine, imdur 15 qd, oxy IR prn, seroquel, renvela 2 ac tidc, coreg 3.125 bid , ticagrelor °  ° OP HD: [facility] °   3h  70kg  F200 2/2.5 bath  350/600  RUA AVG  Hep 1000 then 500u/hr ° - aranesp 35 ug q wk ° - venofer 50mg weekly °  - hectorol 2.5 ug IV tiw ° - last HD 12/23 °  °   CXR 12/27 - IMPRESSION:  New perihilar and left lower lobe airspace opacity suspicious for aspiration or edema. °  °  °Assessment/ Plan: °Acute CVA / R ICA occlusion - s/p mech thrombectomy of R ICA 12/27 °Acute hypoxic respiratory failure - extubated, now on nasal cannula. Mostly resolved.  °Hypotension - On Midodrine 10 mg TID.  +hx HTN on meds at home but low dose (coreg 3.125 bid, hydralazine 10 tid non hd days, imdur 12.5 qd).  °Volume - is up 5kg still, max UF w/ HD today.  °ESRD - HD per MWF schedule. HD today.   °Fever/leukocytosis - PNA. On vanc and zosyn. WBC taking a downturn finally.  NGTD.  °Recent +COVID - on last admit; no longer on precautions  °Nutrition - on Nepro TF's °H/o CM - LVEF 20-25% last echo °Anemia ckd - aranesp increased to 100 mcg weekly and now ordered every Friday.  °MBD ckd - resume renvela at 1 TID with meals for now. Started back hectorol  with HD. Currently NPO on nepro feeds. Diet per primary team  ° ° Rob , MD °09/27/2021, 1:43 PM ° ° ° ° °Recent Labs  °Lab 09/24/21 °0355 09/25/21 °0437 09/26/21 °0450 09/27/21 °0513  °K 5.5*   < > 3.8 4.4  °BUN 78*   < > 104* 129*  °CREATININE 5.79*   < > 6.54* 7.71*  °CALCIUM 8.8*   < > 8.7* 8.6*  °PHOS 3.6  --  5.1*  --   °HGB 8.1*   < > 8.3* 7.8*  ° < > = values in this interval not displayed.  ° ° °Inpatient medications: ° aspirin  325 mg Per Tube Daily  ° Or  ° aspirin  300 mg Rectal Daily  ° atorvastatin  80 mg Per Tube Daily  ° chlorhexidine gluconate (MEDLINE KIT)  15 mL Mouth Rinse BID  ° Chlorhexidine Gluconate Cloth  6 each Topical Q0600  ° Chlorhexidine Gluconate Cloth  6 each Topical Q0600  ° darbepoetin (ARANESP) injection - NON-DIALYSIS  100 mcg Subcutaneous Q Fri-1800  ° doxercalciferol    2.5 mcg Intravenous Q M,W,F-HD  ° ezetimibe  10 mg Per Tube Daily  ° feeding supplement (NEPRO CARB STEADY)  1,000 mL Per Tube Q24H  ° feeding supplement (PROSource TF)  45 mL Per Tube BID  ° heparin injection (subcutaneous)  5,000 Units Subcutaneous Q8H  ° insulin aspart  0-15 Units Subcutaneous Q4H  ° insulin aspart  4 Units Subcutaneous Q4H  ° insulin glargine-yfgn  20 Units Subcutaneous BID  ° ipratropium-albuterol  3 mL Nebulization Q6H  ° mouth rinse  15 mL Mouth Rinse 10 times per day  ° midodrine  10 mg Per Tube TID WC  ° pantoprazole sodium  40 mg Per Tube QHS  ° sevelamer carbonate  0.8 g Oral TID WC  ° ° sodium chloride Stopped (09/21/21 1234)  ° sodium chloride    ° norepinephrine (LEVOPHED) Adult infusion    ° piperacillin-tazobactam (ZOSYN)  IV 2.25 g (09/27/21 0500)  ° °acetaminophen **OR** acetaminophen (TYLENOL) oral liquid 160 mg/5 mL **OR** acetaminophen, guaiFENesin, ondansetron (ZOFRAN) IV ° ° ° ° ° ° °

## 2021-09-28 ENCOUNTER — Other Ambulatory Visit (HOSPITAL_COMMUNITY): Payer: Self-pay

## 2021-09-28 DIAGNOSIS — I214 Non-ST elevation (NSTEMI) myocardial infarction: Secondary | ICD-10-CM

## 2021-09-28 DIAGNOSIS — J9601 Acute respiratory failure with hypoxia: Secondary | ICD-10-CM | POA: Diagnosis not present

## 2021-09-28 DIAGNOSIS — I639 Cerebral infarction, unspecified: Secondary | ICD-10-CM | POA: Diagnosis not present

## 2021-09-28 DIAGNOSIS — L8990 Pressure ulcer of unspecified site, unspecified stage: Secondary | ICD-10-CM | POA: Diagnosis present

## 2021-09-28 LAB — CBC
HCT: 26.8 % — ABNORMAL LOW (ref 36.0–46.0)
Hemoglobin: 8.3 g/dL — ABNORMAL LOW (ref 12.0–15.0)
MCH: 30 pg (ref 26.0–34.0)
MCHC: 31 g/dL (ref 30.0–36.0)
MCV: 96.8 fL (ref 80.0–100.0)
Platelets: 327 10*3/uL (ref 150–400)
RBC: 2.77 MIL/uL — ABNORMAL LOW (ref 3.87–5.11)
RDW: 16.9 % — ABNORMAL HIGH (ref 11.5–15.5)
WBC: 45.2 10*3/uL — ABNORMAL HIGH (ref 4.0–10.5)
nRBC: 0.6 % — ABNORMAL HIGH (ref 0.0–0.2)

## 2021-09-28 LAB — COMPREHENSIVE METABOLIC PANEL
ALT: 78 U/L — ABNORMAL HIGH (ref 0–44)
ALT: 69 U/L — ABNORMAL HIGH (ref 0–44)
AST: 89 U/L — ABNORMAL HIGH (ref 15–41)
AST: 78 U/L — ABNORMAL HIGH (ref 15–41)
Albumin: 3 g/dL — ABNORMAL LOW (ref 3.5–5.0)
Albumin: 2.8 g/dL — ABNORMAL LOW (ref 3.5–5.0)
Alkaline Phosphatase: 82 U/L (ref 38–126)
Alkaline Phosphatase: 80 U/L (ref 38–126)
Anion gap: 15 (ref 5–15)
Anion gap: 16 — ABNORMAL HIGH (ref 5–15)
BUN: 24 mg/dL — ABNORMAL HIGH (ref 8–23)
BUN: 70 mg/dL — ABNORMAL HIGH (ref 8–23)
CO2: 27 mmol/L (ref 22–32)
CO2: 23 mmol/L (ref 22–32)
Calcium: 8.6 mg/dL — ABNORMAL LOW (ref 8.9–10.3)
Calcium: 8.5 mg/dL — ABNORMAL LOW (ref 8.9–10.3)
Chloride: 92 mmol/L — ABNORMAL LOW (ref 98–111)
Chloride: 95 mmol/L — ABNORMAL LOW (ref 98–111)
Creatinine, Ser: 2.25 mg/dL — ABNORMAL HIGH (ref 0.44–1.00)
Creatinine, Ser: 5.34 mg/dL — ABNORMAL HIGH (ref 0.44–1.00)
GFR, Estimated: 23 mL/min — ABNORMAL LOW (ref >60)
GFR, Estimated: 8 mL/min — ABNORMAL LOW (ref >60)
Glucose, Bld: 100 mg/dL — ABNORMAL HIGH (ref 70–99)
Glucose, Bld: 154 mg/dL — ABNORMAL HIGH (ref 70–99)
Potassium: 2.3 mmol/L — CL (ref 3.5–5.1)
Potassium: 3.8 mmol/L (ref 3.5–5.1)
Sodium: 134 mmol/L — ABNORMAL LOW (ref 135–145)
Sodium: 134 mmol/L — ABNORMAL LOW (ref 135–145)
Total Bilirubin: 0.8 mg/dL (ref 0.3–1.2)
Total Bilirubin: 0.9 mg/dL (ref 0.3–1.2)
Total Protein: 7.3 g/dL (ref 6.5–8.1)
Total Protein: 6.9 g/dL (ref 6.5–8.1)

## 2021-09-28 LAB — CULTURE, BLOOD (ROUTINE X 2)
Culture: NO GROWTH
Culture: NO GROWTH
Special Requests: ADEQUATE

## 2021-09-28 LAB — GLUCOSE, CAPILLARY
Glucose-Capillary: 156 mg/dL — ABNORMAL HIGH (ref 70–99)
Glucose-Capillary: 167 mg/dL — ABNORMAL HIGH (ref 70–99)
Glucose-Capillary: 190 mg/dL — ABNORMAL HIGH (ref 70–99)
Glucose-Capillary: 224 mg/dL — ABNORMAL HIGH (ref 70–99)
Glucose-Capillary: 292 mg/dL — ABNORMAL HIGH (ref 70–99)
Glucose-Capillary: 89 mg/dL (ref 70–99)

## 2021-09-28 LAB — PHOSPHORUS
Phosphorus: 1.6 mg/dL — ABNORMAL LOW (ref 2.5–4.6)
Phosphorus: 4.5 mg/dL (ref 2.5–4.6)
Phosphorus: 4.4 mg/dL (ref 2.5–4.6)

## 2021-09-28 LAB — TROPONIN I (HIGH SENSITIVITY)
Troponin I (High Sensitivity): 10661 ng/L (ref <18)
Troponin I (High Sensitivity): 13048 ng/L (ref <18)
Troponin I (High Sensitivity): 10988 ng/L (ref <18)

## 2021-09-28 LAB — HEPARIN LEVEL (UNFRACTIONATED)
Heparin Unfractionated: 0.23 IU/mL — ABNORMAL LOW (ref 0.30–0.70)
Heparin Unfractionated: 0.23 IU/mL — ABNORMAL LOW (ref 0.30–0.70)

## 2021-09-28 LAB — MAGNESIUM
Magnesium: 2.1 mg/dL (ref 1.7–2.4)
Magnesium: 2.4 mg/dL (ref 1.7–2.4)
Magnesium: 2.5 mg/dL — ABNORMAL HIGH (ref 1.7–2.4)

## 2021-09-28 MED ORDER — LOPERAMIDE HCL 2 MG/15ML PO SOLN
4.0000 mg | ORAL | Status: DC | PRN
  Administered 2021-09-28: 4 mg
  Filled 2021-09-28 (×4): qty 30

## 2021-09-28 MED ORDER — LOPERAMIDE HCL 1 MG/7.5ML PO SUSP
2.0000 mg | ORAL | Status: DC | PRN
  Filled 2021-09-28: qty 15

## 2021-09-28 MED ORDER — HEPARIN (PORCINE) 25000 UT/250ML-% IV SOLN
1200.0000 [IU]/h | INTRAVENOUS | Status: DC
  Administered 2021-09-28: 01:00:00 800 [IU]/h via INTRAVENOUS
  Administered 2021-09-29: 1000 [IU]/h via INTRAVENOUS
  Administered 2021-09-29: 1100 [IU]/h via INTRAVENOUS
  Administered 2021-09-30: 1200 [IU]/h via INTRAVENOUS
  Filled 2021-09-28 (×3): qty 250

## 2021-09-28 MED ORDER — JUVEN PO PACK
1.0000 | PACK | Freq: Two times a day (BID) | ORAL | Status: DC
  Administered 2021-09-29 – 2021-10-02 (×7): 1
  Filled 2021-09-28 (×10): qty 1

## 2021-09-28 MED ORDER — INSULIN ASPART 100 UNIT/ML IJ SOLN
2.0000 [IU] | INTRAMUSCULAR | Status: DC
  Administered 2021-09-28 – 2021-09-29 (×6): 2 [IU] via SUBCUTANEOUS

## 2021-09-28 MED ORDER — LOPERAMIDE HCL 1 MG/7.5ML PO SUSP
4.0000 mg | ORAL | Status: DC | PRN
  Filled 2021-09-28: qty 30

## 2021-09-28 MED ORDER — NUTRISOURCE FIBER PO PACK
1.0000 | PACK | Freq: Two times a day (BID) | ORAL | Status: DC
  Administered 2021-09-28 – 2021-10-02 (×10): 1
  Filled 2021-09-28 (×11): qty 1

## 2021-09-28 MED ORDER — POTASSIUM CHLORIDE 10 MEQ/100ML IV SOLN
10.0000 meq | INTRAVENOUS | Status: DC
  Filled 2021-09-28 (×2): qty 100

## 2021-09-28 MED ORDER — PROSOURCE TF PO LIQD
45.0000 mL | Freq: Three times a day (TID) | ORAL | Status: DC
  Administered 2021-09-28 – 2021-10-01 (×8): 45 mL
  Filled 2021-09-28 (×8): qty 45

## 2021-09-28 MED ORDER — VITAL 1.5 CAL PO LIQD
1000.0000 mL | ORAL | Status: DC
  Administered 2021-09-28 – 2021-09-30 (×3): 1000 mL

## 2021-09-28 MED ORDER — POTASSIUM CHLORIDE 20 MEQ PO PACK
40.0000 meq | PACK | Freq: Once | ORAL | Status: AC
  Administered 2021-09-28: 40 meq via ORAL
  Filled 2021-09-28: qty 2

## 2021-09-28 MED ORDER — MIDODRINE HCL 5 MG PO TABS
10.0000 mg | ORAL_TABLET | Freq: Three times a day (TID) | ORAL | Status: DC
  Administered 2021-09-28 – 2021-10-01 (×9): 10 mg via ORAL
  Filled 2021-09-28 (×9): qty 2

## 2021-09-28 NOTE — Progress Notes (Signed)
ANTICOAGULATION CONSULT NOTE  Pharmacy Consult:  Heparin Indication:  concern for ACS in setting of recent stroke  Allergies  Allergen Reactions   Plavix [Clopidogrel Bisulfate] Itching   Codeine Other (See Comments)    "makes me feel strange"   Hydrocodone Other (See Comments)    Feels disorinated   Losartan Other (See Comments)   Tylenol With Codeine #3 [Acetaminophen-Codeine] Other (See Comments)    "doesn't make me feel right"    Patient Measurements: Height: 5\' 4"  (162.6 cm) Weight: 73.5 kg (162 lb 0.6 oz) IBW/kg (Calculated) : 54.7 Heparin Dosing Weight: 70kg  Vital Signs: Temp: 99.7 F (37.6 C) (01/10 2000) Temp Source: Oral (01/10 2000) BP: 120/58 (01/10 2100) Pulse Rate: 106 (01/10 2100)  Labs: Recent Labs    09/26/21 0450 09/27/21 0513 09/27/21 2134 09/28/21 0001 09/28/21 0825 09/28/21 0909 09/28/21 0927 09/28/21 1108 09/28/21 2043  HGB 8.3* 7.8*  --   --  8.3*  --   --   --   --   HCT 27.3* 25.4*  --   --  26.8*  --   --   --   --   PLT 306 257  --   --  327  --   --   --   --   HEPARINUNFRC  --   --   --   --   --  0.23*  --   --  0.23*  CREATININE 6.54* 7.71*  --   --  2.25*  --  5.34*  --   --   TROPONINIHS  --   --    < > 10,661*  --  13,048*  --  10,988*  --    < > = values in this interval not displayed.     Estimated Creatinine Clearance: 9.9 mL/min (A) (by C-G formula based on SCr of 5.34 mg/dL (H)).  Assessment: 71yo female admitted 12/27 with stroke, now with chest pain and elevated troponin.  Pharmacy consulted for heparin dosing.  Heparin level is sub-therapeutic at 0.23 units/mL again this PM.  No issue with heparin infusion nor bleeding per discussion with RN. We will increase rate then check another level in AM.  Goal of Therapy:  Heparin level 0.3-0.5 units/ml Monitor platelets by anticoagulation protocol: Yes   Plan:  Increase heparin gtt to 1000 units/hr - no bolus Check AM heparin level Daily heparin level and CBC  Onnie Boer, PharmD, BCIDP, AAHIVP, CPP Infectious Disease Pharmacist 09/28/2021 9:58 PM

## 2021-09-28 NOTE — Progress Notes (Signed)
[facility] Progress Note  Subjective: had HD last last night in ICU.  Early labs were inaccurate because drawn to close to end of HD.    Vitals:   09/28/21 1400 09/28/21 1435 09/28/21 1500 09/28/21 1600  BP: (!) 105/58  (!) 95/56 107/66  Pulse: 94  97 97  Resp: (!) 29  (!) 31 (!) 24  Temp:      TempSrc:      SpO2: 92% 91% 91% 92%  Weight:      Height:        Physical Exam:   General elderly female in bed  Lungs clear bilat ant/ lat Heart S1S2 no rub Abdomen soft nontender nondistended Extremities right AKA and no left leg edema Neuro - awake and follows simple commands Psych - has mittens still Access:  RUA AVG +b/t      Home meds including:  asa, lipitor, hydralazine 10 tid non hd days, insulin glargine, imdur 15 qd, oxy IR prn, seroquel, renvela 2 ac tidc, coreg 3.125 bid , ticagrelor    OP HD: [facility]    3h  70kg  F200 2/2.5 bath  350/600  RUA AVG  Hep 1000 then 500u/hr  - aranesp 35 ug q wk  - venofer 62m weekly   - hectorol 2.5 ug IV tiw  - last HD 12/23      CXR 12/27 - IMPRESSION:  New perihilar and left lower lobe airspace opacity suspicious for aspiration or edema.     Assessment/ Plan: Acute CVA / R ICA occlusion - s/p mech thrombectomy of R ICA 12/27 Acute hypoxic respiratory failure - extubated, now on nasal cannula. Mostly resolved.  Hypotension - on midodrine 10 mg TID.  +hx HTN on meds at home but low dose (coreg 3.125 bid, hydralazine 10 tid non hd days, imdur 12.5 qd) and have all been on hold here Volume - 2.5 L UF w/ HD yesterday. Vol excess improving, 2-3kg up today. Max UF w/ HD tomorrow.  ESRD - HD per MWF schedule. HD Wed.  Fever/leukocytosis - PNA. On vanc and zosyn. WBC taking a downturn finally.  NGTD.  Recent +COVID - on last admit; not on precautions here Nutrition - on Nepro TF's H/o CM - LVEF 20-25% last echo Anemia ckd - aranesp increased to 100 mcg weekly and now ordered every Friday.  MBD ckd - resume renvela at 1  TID with meals for now. Started back hectorol with HD. Currently NPO on nepro feeds. Diet per primary team    RKelly Splinter MD 09/28/2021, 4:07 PM     Recent Labs  Lab 09/27/21 0513 09/28/21 0825 09/28/21 0927 09/28/21 1108  K 4.4 2.3* 3.8  --   BUN 129* 24* 70*  --   CREATININE 7.71* 2.25* 5.34*  --   CALCIUM 8.6* 8.6* 8.5*  --   PHOS  --  1.6* 4.5 4.4  HGB 7.8* 8.3*  --   --     Inpatient medications:  aspirin  325 mg Per Tube Daily   Or   aspirin  300 mg Rectal Daily   atorvastatin  80 mg Per Tube Daily   chlorhexidine gluconate (MEDLINE KIT)  15 mL Mouth Rinse BID   Chlorhexidine Gluconate Cloth  6 each Topical Q0600   Chlorhexidine Gluconate Cloth  6 each Topical Q0600   darbepoetin (ARANESP) injection - NON-DIALYSIS  100 mcg Subcutaneous Q Fri-1800   doxercalciferol  2.5 mcg Intravenous Q M,W,F-HD   ezetimibe  10 mg  Per Tube Daily   feeding supplement (PROSource TF)  45 mL Per Tube TID   fiber  1 packet Per Tube BID   insulin aspart  0-15 Units Subcutaneous Q4H   insulin aspart  2 Units Subcutaneous Q4H   insulin glargine-yfgn  20 Units Subcutaneous BID   ipratropium-albuterol  3 mL Nebulization Q6H   mouth rinse  15 mL Mouth Rinse 10 times per day   midodrine  10 mg Oral TID WC   [START ON 09/29/2021] nutrition supplement (JUVEN)  1 packet Per Tube BID BM   pantoprazole sodium  40 mg Per Tube QHS   sevelamer carbonate  0.8 g Oral TID WC    sodium chloride Stopped (09/21/21 1234)   sodium chloride     feeding supplement (VITAL 1.5 CAL) 1,000 mL (09/28/21 1600)   heparin 900 Units/hr (09/28/21 1600)   piperacillin-tazobactam (ZOSYN)  IV Stopped (09/28/21 1115)   acetaminophen **OR** acetaminophen (TYLENOL) oral liquid 160 mg/5 mL **OR** acetaminophen, fentaNYL (SUBLIMAZE) injection, guaiFENesin, loperamide HCl, nitroGLYCERIN, ondansetron (ZOFRAN) IV

## 2021-09-28 NOTE — Progress Notes (Addendum)
Nutrition Follow-up ° °DOCUMENTATION CODES:  ° °Not applicable ° °INTERVENTION:  ° °Tube feeding via Cortrak tube: °D/C Nepro (can also contribute to loose stools)  ° °Vital 1.5 @ 50 ml/h (1200 ml per day) °Prosource TF 45 ml TID ° °Provides 1920 kcal, 114 gm protein, 912 ml free water daily ° °Juven BID ° °NUTRITION DIAGNOSIS:  ° °Inadequate oral intake related to inability to eat as evidenced by NPO status. °Ongoing ° °GOAL:  ° °Patient will meet greater than or equal to 90% of their needs °Met with TF ° °MONITOR:  ° °TF tolerance ° °REASON FOR ASSESSMENT:  ° °Consult °Enteral/tube feeding initiation and management ° °ASSESSMENT:  ° °Pt with PMH of ESRD on iHD, NSTEMI, CAD, HTN, HLD, R AKA, stroke, and DM admitted with R MCA with R ICA occlusion.  ° °Pt discussed during ICU rounds and with RN.  °Pt with ongoing diarrhea since 1/8 °Potassium low °Followed by SLP who started Dysphgia 1 with thin liquids 1/9, pt has shown no interest puree, only taking some sips of water. PO for comfort.  °Renvela resumed TID ° ° °Outpatient dry weight: 70 kg  °Pt with L hemiparesis.  °Noted wounds ° °12/27 s/p IR for mechanical thrombectomy °12/29 TF started °1/2 levo weaned off; Renal switched TF to Nepro due to hyperkalemia; pt missed iHD due to staffing issues °1/3 currently receiving iHD °1/6 cortrak tube placed; tip gastric  °1/7 extubated ° °Medications reviewed and include: nutrisource fiber, SSI, novolog, semglee, protonix, renvela packet TID °Precedex ° °Labs reviewed: K: 3.8 <- 2.3; PO4: 4.4 °Troponin: 10,988 °CBG's: 88-190 ° °UF: 2500 °  ° °Diet Order:   °Diet Order   ° °       °  DIET - DYS 1 Room service appropriate? Yes; Fluid consistency: Thin  Diet effective now       °  ° °  °  ° °  ° ° °EDUCATION NEEDS:  ° °Not appropriate for education at this time ° °Skin:  Skin Assessment: Skin Integrity Issues: °Skin Integrity Issues:: Stage II °DTI: coccyx °Stage II: thigh ° °Last BM:  200 via rectal tube ° °Height:  ° °Ht  Readings from Last 1 Encounters:  °09/28/21 5' 4" (1.626 m)  ° ° °Weight:  ° °Wt Readings from Last 1 Encounters:  °09/28/21 73.5 kg  ° ° °Estimated Nutritional Needs:  ° °Kcal:  1600-1800 ° °Protein:  100-115 grams ° °Fluid:  1.2 Ld/ay ° ° P., RD, LDN, CNSC °See AMiON for contact information  ° °

## 2021-09-28 NOTE — Progress Notes (Signed)
NAME:  Jenna Brown, MRN:  [id number], DOB:  20-May-1951, LOS: 76 ADMISSION DATE:  09/14/2021, CONSULTATION DATE:  09/14/2021 REFERRING MD: Dr. Debbrah Alar CHIEF COMPLAINT: Acute R ICA stroke s/p thrombectomy  History of Present Illness:  71 year old woman who presented to [facility] 12/27 with acute R ICA stroke. S/p mechanical thrombectomy 12/28. PMHx significant for HTN, HLD, T2DM, CAD, PVD (s/p R AKA), ESRD on HD. Of note, recent admission for COVID-19 infection (treated with monotherapy as an outpatient); she then developed chest pain with concern for possible NSTEMI.  PCCM consulted for help with vent management post-thrombectomy.  Pertinent Medical History:    has a past medical history of Anemia, Anxiety, Arthritis, Cataract, CKD (chronic kidney disease), stage IV ([place]), Coronary artery disease (2016), Full dentures, GERD (gastroesophageal reflux disease), H/O hiatal hernia, Headache(784.0), Hemorrhoids, AKA (above knee amputation), right ([place]), Hyperlipidemia, Hypertension, Migraines, Myocardial infarction ([place]) (1990's), NSTEMI (non-ST elevated myocardial infarction) ([place]), Peripheral vascular disease ([place]), PONV (postoperative nausea and vomiting), Stroke ([place]), Type II diabetes mellitus ([place]), and Vertigo.   Significant Hospital Events: Including procedures, antibiotic start and stop dates in addition to other pertinent events   12/27 PCCM consult 12/28 CT head with small amount of subarachnoid hyperdensity in posterior right hemisphere, Contrast suspected. MRI CSP moderate to severe spinal canal stenosis MRI/A head Acute infarcts right MCA and PCA territory, most prominent in the right temporal lobe and right occipital lobe.  ECHO EF 25-30%, global hypokinesis, Grade I DD, RVSF normal 12/31 low blood pressure while on dialysis last night.  Was briefly on pressors.  Continues to fail pressure support weans due to desats. Started cefepime 1/3 Failing vent wean 2/2 desaturations. PEEP increased  to 8 (5). HD today per Nephro, longer session given K 6.2. CXR stable. 1/4: received HD; required Neo due to hypotension 1/5: wbc rising; broadened abx to vanc and zosyn.  No cuff leak started on Decadron 1/7 Extubated 1/8 Loose stools. Flexi placed. C. Dif negative   Interim History / Subjective:  Developed chest pain last night and ruled in for NSTEMI.  Currently pain-free.   Objective:  Blood pressure 116/62, pulse 97, temperature 99.3 F (37.4 C), temperature source Oral, resp. rate (!) 25, height 5\' 4"  (1.626 m), weight 73.5 kg, SpO2 95 %.    FiO2 (%):  [96 %] 96 %   Intake/Output Summary (Last 24 hours) at 09/28/2021 1355 Last data filed at 09/28/2021 1300 Gross per 24 hour  Intake 1287.78 ml  Output 3060 ml  Net -1772.22 ml    Filed Weights   09/28/21 0000 09/28/21 0300 09/28/21 0715  Weight: 75.5 kg 75.4 kg 73.5 kg   Physical Examination: Gen:      Elderly appearing female in NAD HEENT:  /AT, PERRL Neck:   No masses, no JVD Lungs:    Clear bilateral breath sounds. No distress.  CV:         RRR, no MRG Abd:    Hyperactive bowel sounds. Soft, non-tender.  Ext:    R AKA remote. No edema Skin:      Grosslyt intact. Warm and dry Neuro:  Alert, oriented. Moving all extremities to command. R>L   Ancillary tests personally reviewed:    Latest Reference Range & Units 08/16/21 10:26 08/16/21 11:58 09/27/21 21:34 09/28/21 00:01 09/28/21 09:09 09/28/21 11:08  Troponin I (High Sensitivity) <18 ng/L 424 (HH) 381 (HH) 11,257 (HH) 10,661 (HH) 13,048 (HH) 10,988 (HH)  (HH): Data is critically high   Latest Reference Range & Units  07/06/07 20:46 05/27/11 05:51 05/28/11 05:00 06/01/11 05:10 06/02/11 06:25 06/03/11 06:27 06/04/11 05:00 06/05/11 05:33 06/06/11 06:45 06/07/11 06:35 06/11/11 16:31 03/27/12 14:16 03/29/12 15:07 03/30/12 04:20 03/31/12 03:45 04/01/12 05:45 04/04/12 16:36 04/06/12 01:52 04/08/12 05:40 05/02/12 17:25 05/04/12 06:17 05/09/12 16:09 05/10/12 21:37 05/11/12  05:30 05/12/12 05:50 05/13/12 05:00 04/10/15 08:50 04/10/15 18:24 04/11/15 02:30 04/12/15 04:50 04/12/15 11:40 04/13/15 02:25 04/21/15 03:20 08/11/15 00:41 08/11/15 04:23 08/12/15 03:53 08/13/15 02:18 12/25/16 00:27 12/25/16 04:49 12/26/16 04:00 12/27/16 05:32 12/28/16 03:47 12/29/16 05:12 12/30/16 04:08 12/31/16 04:54 01/01/17 05:22 01/02/17 03:57 01/03/17 04:15 03/24/17 00:09 03/24/17 06:52 03/25/17 06:09 03/26/17 03:10 03/29/17 03:05 03/29/17 23:33 03/30/17 06:05 04/19/17 10:44 01/11/18 03:51 01/12/18 03:35 01/13/18 02:29 01/13/18 15:15 01/14/18 03:06 04/19/18 19:43 04/20/18 03:13 04/21/18 08:29 04/22/18 04:44 04/23/18 06:08 09/14/18 21:05 09/15/18 05:22 09/16/18 05:36 02/26/19 14:45 02/26/19 22:12 03/01/19 18:13 03/04/19 05:58 03/06/19 07:05 08/16/21 10:26 08/16/21 23:04 08/17/21 06:45 08/18/21 01:08 08/18/21 10:21 08/19/21 00:54 08/30/21 17:25 09/14/21 09:09 09/15/21 04:29 09/16/21 04:10 09/17/21 06:14 09/18/21 05:41 09/19/21 02:44 09/20/21 06:52 09/21/21 05:20 09/22/21 04:16 09/23/21 04:36 09/24/21 03:55 09/25/21 06:40 09/26/21 04:50 09/27/21 05:13 09/28/21 08:25  WBC 4.0 - 10.5 K/uL 8.2 8.5 13.9 (H) 9.6 10.0 10.0 10.9 (H) 10.0 10.0 8.3 10.9 (H) 10.3 19.4 (H) 14.3 (H) 13.4 (H) 13.6 (H) 13.2 (H) 18.7 (H) 15.2 (H) 16.7 (H) 20.1 (H) 16.9 (H) 18.3 (H) 16.9 (H) 15.0 (H) 14.8 (H) 11.5 (H) 10.3 9.4 9.1 8.9 9.1 9.1 12.4 (H) 11.0 (H) 8.7 11.7 (H) 11.5 (H) 11.7 (H) 9.5 8.1 8.6 7.9 8.8 8.4 8.3 8.4 9.4 12.0 (H) 10.9 (H) 8.4 9.1 7.7 8.6 9.0 7.9 11.0 (H) 10.2 9.8 9.3 11.4 (H) 11.4 (H) 10.7 (H) 7.9 8.0 9.3 8.8 11.4 (H) 8.8 8.3 7.3 7.3 9.0 17.8 (H) 4.6 5.6 4.4 5.6 6.0 7.6 8.0 10.5 9.2 8.8 12.8 (H) 15.7 (H) 15.4 (H) 13.8 (H) 14.1 (H) 15.6 (H) 19.1 (H) 21.4 (H) 28.3 (H) 37.3 (H) 27.8 (H) 45.2 (H)  (H): Data is abnormally high Assessment & Plan:  Acute ischemic stroke with occlusion of proximal right internal carotid S/p mechanical thrombectomy, (TICI 3), CT head 12/28 with small amount of subarachnoid hyperdensity in  posterior right hemisphere, Contrast suspected. 12/28 MRI CSP moderate to severe spinal canal stenosis MRI/A head acute infarcts right MCA and PCA territory, most prominent in the right temporal lobe and right occipital lobe. P: Stroke service primary Continue aspirin, statin No anticoagulation due to large nature of stroke  KLEBSIELLA ORNITHINOLYTICA pneumonia: 1/2 ceftriaxone<<1/5 broadened abx to zosyn and vanc due to worsening fever/leukocytosis.  COVID-19 Positive- asymptomatic Small LL pleural effusion P: Stable post extubation. Wean down o2 as tolerated.  Continue Zosyn, DC vanco  Intermittent hypotension P: Continue midodrine, as needed Levophed for hemodialysis next  End-stage renal disease on hemodialysis (MWF) Hyperkalemia P: Hemodialysis per nephrology  Severe ischemic cardiomyopathy with new NSTEMI HX HTN HX HLD 12/29 ECHO EF 25-30%, global hypokinesis, Grade I DD, RVSF normal. Remains off pressors. P: -BB on hold due to hypotension -ASA, statin, zetia -telemetry monitoring - Medical management: heparin iv 48h then brillinta and ASA. Not good NOAC candidate due to renal failure.   DM2 P: Continue SSI and basal insulin Continue tube feed coverage CBG monitoring  GERD P: PPI  Anemia of chronic illness P: Trend CBC Likely in the setting of ESRD, Aranesp  Best Practice: (right click and "Reselect all SmartList Selections" daily)   Diet/type: tubefeeds DVT prophylaxis: SCD GI prophylaxis: PPI Lines: N/A Foley:  N/A  Code Status:  full code Last date of multidisciplinary goals of care discussion: Full code  Kipp Brood, MD [facility] ICU Physician [place]  Pager: [phone] Or Epic Secure Chat After hours: [phone].  09/28/2021, 2:05 PM      09/28/2021 1:55 PM

## 2021-09-28 NOTE — Progress Notes (Signed)
ANTICOAGULATION CONSULT NOTE  Pharmacy Consult:  Heparin Indication:  concern for ACS in setting of recent stroke  Allergies  Allergen Reactions   Plavix [Clopidogrel Bisulfate] Itching   Codeine Other (See Comments)    "makes me feel strange"   Hydrocodone Other (See Comments)    Feels disorinated   Losartan Other (See Comments)   Tylenol With Codeine #3 [Acetaminophen-Codeine] Other (See Comments)    "doesn't make me feel right"    Patient Measurements: Height: 5\' 4"  (162.6 cm) Weight: 73.5 kg (162 lb 0.6 oz) IBW/kg (Calculated) : 54.7 Heparin Dosing Weight: 70kg  Vital Signs: Temp: 98.6 F (37 C) (01/10 0800) Temp Source: Oral (01/10 0800) BP: 104/51 (01/10 1100) Pulse Rate: 102 (01/10 1100)  Labs: Recent Labs    09/26/21 0450 09/27/21 0513 09/27/21 2134 09/28/21 0001 09/28/21 0825 09/28/21 0909 09/28/21 0927  HGB 8.3* 7.8*  --   --  8.3*  --   --   HCT 27.3* 25.4*  --   --  26.8*  --   --   PLT 306 257  --   --  327  --   --   HEPARINUNFRC  --   --   --   --   --  0.23*  --   CREATININE 6.54* 7.71*  --   --  2.25*  --  5.34*  TROPONINIHS  --   --  11,257* 10,661*  --  13,048*  --      Estimated Creatinine Clearance: 9.9 mL/min (A) (by C-G formula based on SCr of 5.34 mg/dL (H)).  Assessment: 71yo female admitted 12/27 with stroke, now with chest pain and elevated troponin.  Pharmacy consulted for heparin dosing.  Heparin level is sub-therapeutic at 0.23 units/mL on 800 units/hr.  No issue with heparin infusion nor bleeding per discussion with RN.  Goal of Therapy:  Heparin level 0.3-0.5 units/ml Monitor platelets by anticoagulation protocol: Yes   Plan:  Increase heparin gtt to 900 units/hr - no bolus Check 8 hr heparin level Daily heparin level and CBC  Kerrigan Glendening D. [name], PharmD, BCPS, [place] 09/28/2021, 1:25 PM

## 2021-09-28 NOTE — Progress Notes (Addendum)
STROKE TEAM PROGRESS NOTE   INTERVAL HISTORY No family at the bedside. She is awake alert and oriented and following commands. Remains extubated. Hemodynamically stable. WBC improved to 27.8 from 37.3. NSTEMI overnight, cardiology consulted, heparin IV initiated, transition to [facility] in 48 hours per their recs.   Vitals:   09/28/21 0830 09/28/21 0845 09/28/21 0900 09/28/21 1000  BP: 137/86 (!) 150/83 124/70 124/71  Pulse: 91 (!) 105 (!) 105 100  Resp: (!) 21 (!) 31 (!) 35 (!) 32  Temp:      TempSrc:      SpO2: 99% 97% 97% 97%  Weight:      Height:       CBC:  Recent Labs  Lab 09/22/21 0416 09/23/21 0436 09/27/21 0513 09/28/21 0825  WBC 15.6*   < > 27.8* 45.2*  NEUTROABS 9.8*  --   --   --   HGB 7.9*   < > 7.8* 8.3*  HCT 27.0*   < > 25.4* 26.8*  MCV 98.2   < > 96.6 96.8  PLT 203   < > 257 327   < > = values in this interval not displayed.    Basic Metabolic Panel:  Recent Labs  Lab 09/28/21 0825 09/28/21 0927  NA 134* 134*  K 2.3* 3.8  CL 92* 95*  CO2 27 23  GLUCOSE 100* 154*  BUN 24* 70*  CREATININE 2.25* 5.34*  CALCIUM 8.6* 8.5*  MG 2.1 2.4  PHOS 1.6* 4.5    Lipid Panel:  No results for input(s): CHOL, TRIG, HDL, CHOLHDL, VLDL, LDLCALC in the last 168 hours.  HgbA1c:  No results for input(s): HGBA1C in the last 168 hours.  Urine Drug Screen:  No results for input(s): LABOPIA, COCAINSCRNUR, LABBENZ, AMPHETMU, THCU, LABBARB in the last 168 hours.   Alcohol Level  No results for input(s): ETH in the last 168 hours.  IMAGING past 24 hours No results found.  PHYSICAL EXAM  Temp:  [97.5 F (36.4 C)-100.5 F (38.1 C)] 98.6 F (37 C) (01/10 0800) Pulse Rate:  [70-110] 100 (01/10 1000) Resp:  [21-53] 32 (01/10 1000) BP: (98-173)/(57-101) 124/71 (01/10 1000) SpO2:  [94 %-100 %] 97 % (01/10 1000) FiO2 (%):  [96 %] 96 % (01/09 1929) Weight:  [73.5 kg-75.5 kg] 73.5 kg (01/10 0715)  General - intubated off sedation, in no acute distress  Cardiovascular -  Regular rate and rhythm.  Neurological: Awake alert and interactive. Oriented to person and place.  Eyes are open and follows simple commands. PERRL, tracking bilaterally, no significant gaze palsy but still has left hemianopia, not blinking to visual threat on the left. Voice is hypophonic and hoarse. Moderate dysarthria and paucity of speech. Will move all extremities in response to commands with at least 4/5 strength in RUE and RLE (right AKA chronic), 2/5 strength in LUE proximal and 3/5 bicep, and LLE 3/5 strength. Sensation and coordination not cooperative, and gait not tested.  ASSESSMENT/PLAN Jenna Brown is a 71 y.o. female with history of ESRD on IHD, NSTEMI, CAD, HTN, HLD, right AKA, stroke and T2DM presenting with left sided weakness, left facial droop and slurred speech.  She was brought to the ED.  TNK was not given as she presented outside the window.  CT angiography revealed LVO in the proximal right ICA with perfusion study showing core infarct of 29cc and penumbra of 168cc in right MCA and PCA territories. Mechanical thrombectomy was performed with TICI 3 flow achieved.  Receiving hemodialysis  per nephrology. Extubated 1/7 to [facility]. Remains extubated and doing well from a respiratory standpoint.  Cortrak in place for TF and medications.   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 Bilateral basal ganglia and left cerbellar chronic infarcts, chronic C1-C2 subluxation. ASPECTS 10.    CTA head & neck emergent LVO at proximal right ICA, isolated right M1 segment and fetal type right PCA, left vertebral artery occlusion, stenotic right vertebral artery, high grade mid basilar stenosis CT perfusion 29/197 Status post IR with TICI3 at right ICA.  MRI  acute infarcts in right MCA and PCA territory, punctate foci of restricted diffusion in bilateral forntal and parietal  lobes as well as left occipital lobe MRA  resolution of right ICA occlusion  2D Echo EF 25-30%, no LV thrombus LDL 74 HgbA1c 5.5 UDS negative VTE prophylaxis -Heparin subcu aspirin 81 mg daily and Brilinta (ticagrelor) 90 mg bid prior to admission, now IV heparin initiated for NSTEMI over night 09/27/2021, will continue heparin IV for 48 hours if tolerating will switch to DOAC. Switch brilinta to plavix for 6 months and ASA for 2 weeks to decreasing bleeding risk.  Therapy recommendations:  SNF Disposition:  pending  Respiratory failure Extubated 1/7 to [facility] Management per CCM Currently tolerating well Consider tracheostomy if reintubated  Elevated WBC Fever WBC 15.6 > 19.1->21.4->28.3->37.3->45.2 T-max 100.1-> afebrile Possible aspiration pneumonia.  Cefepime -> vancomycin and Zosyn managed by CCM.  Cardiomyopathy 07/2021 EF 20 to 25% This admission EF 25 to 30% Home meds including Imdur and Coreg Currently not on home meds due to hypotension Management per CCM On heparin IV for 48 hours, if tolerating well, will switch to Eliquis  Hx of hypertension Hypotension Home meds:  Coreg 3.125 mg BID BP on the low end Off Levophed Long-term BP goal normotensive Continue midodrine 10 TID  Hyperlipidemia Home meds:  atorvastatin 80 mg daily, resumed in hospital LDL 74, goal < 70 Zetia 10 mg daily and atorvastatin 80mg  Continue statin at discharge  Diabetes Hemoglobin A1c 5.5, uncontrolled Hyperglycemia On insulin SSI CBG monitoring Close PCP follow-up for better DM control Diabetic consult.  NSTEMI Chest pain with significantly elevated troponin Cardiology on board recommends heparin drip for 48 hours and then OK to transition to [place].  Agree with aspirin 81, Plavix, DOAC for 2 weeks and then Plavix and DOAC for 6 months and then DOAC alone.   Continue titrating down midodrine Add metoprolol when bp is stable  Other Stroke Risk Factors Advanced Age >/= 85  Former  cigarette smoker Hx stroke Coronary artery disease PVD  Other Active Problems ESRD on IHD - Appreciate nephrology recommendations  Right LE AKA Initial CT showed C1-C2 subluxation with anterior spinal cord compression, similar as prior.  MRI C-spine showed diffuse cervical spine spinal canal stenosis with cord flattening but no cord signal abnormality. Anemia due to ESRD, hemoglobin 8.5 > 7.9 > 7.5->7.6->8.3  Hospital day # 14  Patient seen and examined by NP/APP with MD. MD to update note as needed.   Janine Ores, DNP, FNP-BC [facility] Pager: [phone]  ATTENDING NOTE: I reviewed above note and agree with the assessment and plan. Pt was seen and examined.   No family at bedside.  Patient lying in bed, awake alert, neuro stable unchanged.  However, patient had overnight chest pain and significantly elevated troponin, cardiology consulted, concerning  for non-STEMI, put on heparin IV for 48 hours.  Discussed with cardiology and the pharmacy, will do heparin IV 48 hours, if tolerating well without hemorrhagic transformation, will transition heparin IV to Eliquis.  We will also do aspirin and Plavix and Eliquis for 2 weeks and then Plavix and Eliquis for 6 months and then Eliquis alone.  Patient continues to have significant leukocytosis and low-grade fever, continue antibiotics.  Appreciate CCM and cardiology assistant.  For detailed assessment and plan, please refer to above as I have made changes wherever appropriate.   Rosalin Hawking, MD PhD Stroke Neurology 09/28/2021 9:21 PM  This patient is critically ill due to right MCA and PCA large stroke, respiratory failure, ESRD on dialysis, CHF with low EF, non-STEMI, leukocytosis, fever and at significant risk of neurological worsening, death form heart failure, cardiac arrest, stroke recurrence, hemorrhagic transformation, bleeding from heparin, sepsis. This patient's care requires constant monitoring of vital signs,  hemodynamics, respiratory and cardiac monitoring, review of multiple databases, neurological assessment, discussion with family, other specialists and medical decision making of high complexity. I spent 40 minutes of neurocritical care time in the care of this patient.  I discussed with cardiology on-call and CCM Dr. Lynetta Mare.     To contact Stroke Continuity provider, please refer to [email or web address] After hours, contact General Neurology

## 2021-09-28 NOTE — Progress Notes (Addendum)
Progress Note  Patient Name: Jenna Brown Date of Encounter: 09/28/2021  [facility] Cardiologist: Skeet Latch, MD   Subjective   No chest pain. Breathing stable.   Inpatient Medications    Scheduled Meds:  aspirin  325 mg Per Tube Daily   Or   aspirin  300 mg Rectal Daily   atorvastatin  80 mg Per Tube Daily   chlorhexidine gluconate (MEDLINE KIT)  15 mL Mouth Rinse BID   Chlorhexidine Gluconate Cloth  6 each Topical Q0600   Chlorhexidine Gluconate Cloth  6 each Topical Q0600   darbepoetin (ARANESP) injection - NON-DIALYSIS  100 mcg Subcutaneous Q Fri-1800   doxercalciferol  2.5 mcg Intravenous Q M,W,F-HD   ezetimibe  10 mg Per Tube Daily   feeding supplement (NEPRO CARB STEADY)  1,000 mL Per Tube Q24H   feeding supplement (PROSource TF)  45 mL Per Tube BID   insulin aspart  0-15 Units Subcutaneous Q4H   insulin aspart  4 Units Subcutaneous Q4H   insulin glargine-yfgn  20 Units Subcutaneous BID   ipratropium-albuterol  3 mL Nebulization Q6H   mouth rinse  15 mL Mouth Rinse 10 times per day   midodrine  5 mg Per Tube TID WC   nitroGLYCERIN  1 inch Topical Q6H   pantoprazole sodium  40 mg Per Tube QHS   sevelamer carbonate  0.8 g Oral TID WC   Continuous Infusions:  sodium chloride Stopped (09/21/21 1234)   sodium chloride     heparin 800 Units/hr (09/28/21 0900)   piperacillin-tazobactam (ZOSYN)  IV Stopped (09/27/21 2005)   potassium chloride     PRN Meds: acetaminophen **OR** acetaminophen (TYLENOL) oral liquid 160 mg/5 mL **OR** acetaminophen, fentaNYL (SUBLIMAZE) injection, guaiFENesin, nitroGLYCERIN, ondansetron (ZOFRAN) IV   Vital Signs    Vitals:   09/28/21 0818 09/28/21 0830 09/28/21 0845 09/28/21 0900  BP: (!) 147/83 137/86 (!) 150/83 124/70  Pulse: (!) 103 91 (!) 105 (!) 105  Resp: (!) 28 (!) 21 (!) 31 (!) 35  Temp:      TempSrc:      SpO2: 100% 99% 97% 97%  Weight:      Height:        Intake/Output Summary (Last 24 hours) at 09/28/2021  1004 Last data filed at 09/28/2021 0930 Gross per 24 hour  Intake 1046.08 ml  Output 2970 ml  Net -1923.92 ml   Last 3 Weights 09/28/2021 09/28/2021 09/28/2021  Weight (lbs) 162 lb 0.6 oz 166 lb 3.6 oz 166 lb 7.2 oz  Weight (kg) 73.5 kg 75.4 kg 75.5 kg      Telemetry    Sinus tachycardia in 100s - Personally Reviewed  ECG    N/A  Physical Exam   GEN: ill appearing female in no acute distress.   Neck: No JVD Cardiac: regular tachycardia, no murmurs, rubs, or gallops.  Respiratory: diminished breath sound  GI: Soft, nontender, non-distended  MS: S/p R AKA Neuro:  left facial droop and L sided weakness Psych: Normal affect   Labs    High Sensitivity Troponin:   Recent Labs  Lab 09/27/21 2134 09/28/21 0001  TROPONINIHS 11,257* 10,661*     Chemistry Recent Labs  Lab 09/27/21 0513 09/28/21 0825 09/28/21 0927  NA 138 134* 134*  K 4.4 2.3* 3.8  CL 93* 92* 95*  CO2 21* 27 23  GLUCOSE 190* 100* 154*  BUN 129* 24* 70*  CREATININE 7.71* 2.25* PENDING  CALCIUM 8.6* 8.6* 8.5*  MG  --  2.1 2.4  PROT  --  7.3 6.9  ALBUMIN  --  3.0* 2.8*  AST  --  89* 78*  ALT  --  78* 69*  ALKPHOS  --  82 80  BILITOT  --  0.8 0.9  GFRNONAA 5* 23* PENDING  GFRAA  --   --  PENDING  ANIONGAP 24* 15 16*    Lipids No results for input(s): CHOL, TRIG, HDL, LABVLDL, LDLCALC, CHOLHDL in the last 168 hours.  Hematology Recent Labs  Lab 09/26/21 0450 09/27/21 0513 09/28/21 0825  WBC 37.3* 27.8* 45.2*  RBC 2.82* 2.63* 2.77*  HGB 8.3* 7.8* 8.3*  HCT 27.3* 25.4* 26.8*  MCV 96.8 96.6 96.8  MCH 29.4 29.7 30.0  MCHC 30.4 30.7 31.0  RDW 15.5 15.7* 16.9*  PLT 306 257 327    Radiology    DG CHEST PORT 1 VIEW  Result Date: 09/26/2021 CLINICAL DATA:  Acute respiratory failure. EXAM: PORTABLE CHEST 1 VIEW COMPARISON:  09/23/2021 and older exams.  Chest CT, 09/22/2021. FINDINGS: Lung volumes remain low. There is left greater than right lung base opacity consistent with a combination of  atelectasis and small effusions. Remainder of the lungs is clear. No pneumothorax. New enteric tube passes below the diaphragm, well into the stomach and below the included field of view. Previous NG tube and ET tube have been removed. IMPRESSION: 1. No significant change in lung aeration. Stable, left greater than right, lung base opacities, consistent with atelectasis and small effusions, accentuated by low lung volumes. 2. Status post extubation. Electronically Signed   By: Lajean Manes M.D.   On: 09/26/2021 13:33    Cardiac Studies   Echo 09/15/2021 1. Apical, mid-apical anterior, mid-apical anteroseptal, mid-apical  inferoseptal, mid-apical inferior, and mid-apical inferolateral akinesis.  Otherwise global hypokinesis. Left ventricular ejection fraction, by  estimation, is 25 to 30%. The left  ventricle has severely decreased function. The left ventricle demonstrates  regional wall motion abnormalities (see scoring diagram/findings for  description). The left ventricular internal cavity size was mildly  dilated. There is mild left ventricular  hypertrophy of the basal-septal segment. Left ventricular diastolic  parameters are consistent with Grade I diastolic dysfunction (impaired  relaxation).   2. Right ventricular systolic function is normal. The right ventricular  size is normal.   3. Left atrial size was mildly dilated.   4. The mitral valve is normal in structure. No evidence of mitral valve  regurgitation. No evidence of mitral stenosis. Moderate mitral annular  calcification.   5. The aortic valve is tricuspid. There is mild calcification of the  aortic valve. There is mild thickening of the aortic valve. Aortic valve  regurgitation is not visualized. No aortic stenosis is present.   6. The inferior vena cava is normal in size with <50% respiratory  variability, suggesting right atrial pressure of 8 mmHg.   Patient Profile     71 y.o. female with hx of known multivessel  CAD (no target for PCI & not a CABG candidate), ESRD on HD, s/p R AKA, hypertension, DM, recent COVID-19 infection who was admitted 09/14/21 for acute ischemic/embolic stroke right MCA and PCA with R ICA occlusion status post mechanical thrombectomy on 09/14/21. Patient was extubated on 1/7. Seen by cardiology overnight for chest pain. [place] to have elevated troponin.   Assessment & Plan    NSTEMI with known CAD - Patient developed NSTEMI tonight in the setting of recent acute ischemic stroke. Most recent Hs-troponin 13048. Chest pain free currently.  -  Patient known CAD who felt poor CABG and revascularization candidate - Heparin started overnight after discussion with neurology >> will continue for 48 hours as long as no complications and okay with neuro - Continue statin  - Continue ASA 354m (for stroke) - Previously required pressure for blood pressure support. Currently weaning off on midodrine, when off consider adding low dose metoprolol if able - Echo 09/15/21 demonstrated LVEF 25-30% with WMA consistent with MV-CAD.  For questions or updates, please contact CKeshenaPlease consult [email or web address] for contact info under        Signed, BLeanor Kail PA  09/28/2021, 10:04 AM     ATTENDING ATTESTATION:  After conducting a review of all available clinical information with the care team, interviewing the patient, and performing a physical exam, I agree with the findings and plan described in this note.   GEN: No acute distress, dysarthric Cardiac: RRR, no murmurs, rubs, or gallops.  Respiratory: Clear to auscultation bilaterally. GI: Soft, nontender, non-distended  MS: No edema; No deformity. Neuro:  Dysarthric  The patient is a 71year old African-American female with multivessel coronary artery disease with prior cardiac catheterization in 2016 demonstrated high-grade LAD and left circumflex lesions.  She was not deemed a surgical candidate and not thought to have  feasible targets for PCI due to mild caliber vessels.  Additionally she has an ischemic cardiomyopathy and has refused ICD in the past.  Additionally she has end-stage renal disease, peripheral vascular disease status post right AKA, hypertension, and type 2 diabetes who was admitted with an acute embolic stroke status post mechanical thrombectomy on December 27.  Yesterday night she developed severe chest pain.  She has been rendered chest pain-free on a heparin drip.  Agree with continuing heparin drip for 48 hours, decreasing aspirin to 81 mg, restart Ticagrelor 926mBID (has plavix allergy), cont high dose atorvastatin.  Will add metoprolol in future.   ArLenna SciaraMD Pager [phone]:  Given poor LV function with segmental (apical) WMA, will pursue anticoagulation.  Discussed with primary team, will pursue 48hr hep gtt, plavix load with 300, and decrease ASA 81 (change to plavix due to high bleeding risk with Coumadin and ticagrelor).  After hep gtt, Coumadin/ASA/plavix x 2 weeks, then Coumadin/plavix x 6 months, then Coumadin indefinitely (treating LV WMA re stroke risk and medical treatment of NSTEMI).  Will discuss with pharmacy if Eliquis can be used.  I spent 30 minutes critical care time evaluating all patient data, interviewing the patient, formulating a treatment plan, and discussing with the treatment team.  ArLenna SciaraMD Pager [phone]

## 2021-09-28 NOTE — Progress Notes (Addendum)
Occupational Therapy Treatment Patient Details Name: Jenna Brown MRN: [id number] DOB: 09/21/1950 Today's Date: 09/28/2021   History of present illness 71 yo female presents to [facility] on 12/27 with L hemiparesis, slurred speech. 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 Of note,"Patient has atlantodental instability with cord compression and recommend minimizing cervical manipulation especially neck extension; may benefit from neurosurgical evaluation". ETT 12/27-1/7. NSTEMI 1/9. PMH includes recent hospitalization with covid and possible STEMI, end-stage renal disease on hemodialysis, NSTEMI, CAD, HTN, HLD, right AKA 2013, history of TIA/stroke, and type 2 diabetes mellitus.   OT comments  Pt seen bed level today, RN present and cleared for OT.  Patient limited by fatigue and buttocks pain, positioned in to chair position to engage in ADLs.  Demonstrates active movement of L UE during functional bimanual tasks, but inconsistent.  Able to "push" L arm forward x 1, move hand several times; limited by neglect to UE and moving R arm when asked to move L.  Pt pushing with R hand on rail with heavy L lateral lean, unsure if due to pain or true pushing. Inconsistently following commands, but does require increased time. VSS during session. Continue to recommend SNF at this time.  Will follow acutely.    Recommendations for follow up therapy are one component of a multi-disciplinary discharge planning process, led by the attending physician.  Recommendations may be updated based on patient status, additional functional criteria and insurance authorization.    Follow Up Recommendations  Skilled nursing-short term rehab (<3 hours/day)    Assistance Recommended at Discharge Frequent or constant Supervision/Assistance  Patient can return home with the following      Equipment Recommendations   BSC/3in1;Wheelchair (measurements OT);Wheelchair cushion (measurements OT);Hospital bed    Recommendations for Other Services      Precautions / Restrictions Precautions Precautions: Fall Restrictions Weight Bearing Restrictions: No       Mobility Bed Mobility Overal bed mobility: Needs Assistance             General bed mobility comments: repositoining in bed with total assist +2, L lateral lean and heavy pushing with R UE on rail    Transfers                         Balance Overall balance assessment: Needs assistance Sitting-balance support: No upper extremity supported Sitting balance-Leahy Scale: Poor Sitting balance - Comments: heavy L lateral lean upright in bed, supported by pillows to maintain midline                                   ADL either performed or assessed with clinical judgement   ADL Overall ADL's : Needs assistance/impaired     Grooming: Wash/dry hands;Oral care;Maximal assistance;Bed level Grooming Details (indicate cue type and reason): sitting upright in bed, able to briefly hold toothbrush with R hand (after reaching to grab with L initally) and initate washing hands but unable to sustain task. Overall max hand over hand assist.  bring wash cloth up to mouth to wipe secretions.                               General ADL Comments: total assist for all other ADLs as this time.    Extremity/Trunk Assessment Upper  Extremity Assessment Upper Extremity Assessment: LUE deficits/detail RUE Deficits / Details: AROM and functionally using during session LUE Deficits / Details: demonstrated ability to push foward 1x and move hand several times, continues to demonstrate neglect to UE and used in functional task automatically (washing hands) briefly LUE Sensation: decreased light touch LUE Coordination: decreased fine motor;decreased gross motor            Vision   Additional Comments: scanning to L and R  today   Perception     Praxis      Cognition Arousal/Alertness: Awake/alert Behavior During Therapy: Restless Overall Cognitive Status: Impaired/Different from baseline Area of Impairment: Following commands;Orientation;Memory;Safety/judgement;Awareness;Problem solving;Attention                 Orientation Level: Disoriented to;Situation;Time Current Attention Level: Focused Memory: Decreased short-term memory Following Commands: Follows one step commands inconsistently;Follows one step commands with increased time Safety/Judgement: Decreased awareness of safety;Decreased awareness of deficits Awareness: Intellectual Problem Solving: Slow processing;Decreased initiation;Difficulty sequencing;Requires verbal cues;Requires tactile cues General Comments: pt nods head yes to hospital and verbalizes "[birth year]" for birthday but requires choices for month.  She requires constant redirection to task, improved ability to attend/use L UE briefly with functional tasks.  Inconsistently following 1 step commands.          Exercises Exercises: Other exercises Other Exercises Other Exercises: PROM to L UE   Shoulder Instructions       General Comments VSS on 2L [place]: BP 129/73 and HR low 100s    Pertinent Vitals/ Pain       Pain Assessment: Faces Faces Pain Scale: Hurts even more Pain Location: buttocks Pain Descriptors / Indicators: Sore;Discomfort Pain Intervention(s): Limited activity within patient's tolerance;Monitored during session;Repositioned  Home Living                                          Prior Functioning/Environment              Frequency  Min 2X/week        Progress Toward Goals  OT Goals(current goals can now be found in the care plan section)  Progress towards OT goals: Progressing toward goals  Acute Rehab OT Goals OT Goal Formulation: Patient unable to participate in goal setting Time For Goal Achievement: 10/04/21 Potential to  Achieve Goals: [place] Discharge plan remains appropriate;Frequency remains appropriate    Co-evaluation                 AM-PAC OT "6 Clicks" Daily Activity     Outcome Measure   Help from another person eating meals?: Total Help from another person taking care of personal grooming?: A Lot Help from another person toileting, which includes using toliet, bedpan, or urinal?: Total Help from another person bathing (including washing, rinsing, drying)?: Total Help from another person to put on and taking off regular upper body clothing?: Total Help from another person to put on and taking off regular lower body clothing?: Total 6 Click Score: 7    End of Session Equipment Utilized During Treatment: Oxygen  OT Visit Diagnosis: Muscle weakness (generalized) (M62.81)   Activity Tolerance Patient limited by pain;Patient limited by fatigue   Patient Left in bed;with call bell/phone within reach;with bed alarm set;with nursing/sitter in room   Nurse Communication Mobility status;Precautions        Time: 3810-1751 OT Time Calculation (min): 30  min  Charges: OT General Charges $OT Visit: 1 Visit OT Treatments $Self Care/Home Management : 8-22 mins $Neuromuscular Re-education: 8-22 mins  Jolaine Artist, OT [place] Pager [phone] Office [phone]   Delight Stare 09/28/2021, 10:08 AM

## 2021-09-28 NOTE — TOC Benefit Eligibility Note (Signed)
Patient Teacher, English as a foreign language completed.    The patient is currently admitted and upon discharge could be taking Eliquis 5 mg.  The current 30 day co-pay is, $4.30.   The patient is insured through [place] Patient Advocate Specialist [place] Patient Advocate Team Direct Number: [phone]  Fax: [phone]

## 2021-09-28 NOTE — Progress Notes (Signed)
Speech Language Pathology Treatment: Dysphagia  Patient Details Name: Jenna Brown MRN: [id number] DOB: Jun 15, 1951 Today's Date: 09/28/2021 Time: 1100-1108 SLP Time Calculation (min) (ACUTE ONLY): 8 min  Assessment / Plan / Recommendation Clinical Impression  Pt demonstrates ongoing disinterest in PO. Encouragement needed for pt to take sips of water or bites of puree. Pt elevated side lying and able to take two sips of water without immediate signs of aspiration. Pt had no anterior spillage likely due to lying on right side (left side weak). Would not take puree. Continue sips for comfort, though intake has been nonexistent. Will continue efforts.   HPI HPI: Pt is a 71 yo female who presented on 12/27 with L hemiparesis, slurred speech. CT head with small amount of subarachnoid hyperdensity in posterior right hemisphere, Contrast suspected. MRI CSP moderate to severe spinal canal stenosis MRI/A head Acute infarcts right MCA and PCA territory, most prominent in the right temporal lobe and right occipital lobe. Pt s/p diagnostic cerebral angiogram and mechanical thrombectomy on 09/14/21. ETT 12/27-12/7. Cortrak placed 1/6. PMH: recent hospitalization with COVID and possible STEMI, ESRD on HD, NSTEMI, CAD, HTN, HLD, right AKA 2013, TIA/stroke, GERD, and type 2 diabetes mellitus.      SLP Plan  Continue with current plan of care      Recommendations for follow up therapy are one component of a multi-disciplinary discharge planning process, led by the attending physician.  Recommendations may be updated based on patient status, additional functional criteria and insurance authorization.    Recommendations  Diet recommendations: Thin liquid;Dysphagia 1 (puree) Liquids provided via: Cup;Straw Medication Administration: Via alternative means Supervision: Staff to assist with self feeding;Full supervision/cueing for compensatory strategies Compensations: Slow rate;Small sips/bites Postural  Changes and/or Swallow Maneuvers: Seated upright 90 degrees                Oral Care Recommendations: Oral care BID Follow Up Recommendations: Skilled nursing-short term rehab (<3 hours/day) Assistance recommended at discharge: Frequent or constant Supervision/Assistance Plan: Continue with current plan of care         Herbie [place], [place] Office [phone]   Lynann Beaver  09/28/2021, 11:35 AM

## 2021-09-29 DIAGNOSIS — I214 Non-ST elevation (NSTEMI) myocardial infarction: Secondary | ICD-10-CM | POA: Diagnosis not present

## 2021-09-29 DIAGNOSIS — I639 Cerebral infarction, unspecified: Secondary | ICD-10-CM | POA: Diagnosis not present

## 2021-09-29 DIAGNOSIS — J9601 Acute respiratory failure with hypoxia: Secondary | ICD-10-CM | POA: Diagnosis not present

## 2021-09-29 LAB — GLUCOSE, CAPILLARY
Glucose-Capillary: 149 mg/dL — ABNORMAL HIGH (ref 70–99)
Glucose-Capillary: 180 mg/dL — ABNORMAL HIGH (ref 70–99)
Glucose-Capillary: 189 mg/dL — ABNORMAL HIGH (ref 70–99)
Glucose-Capillary: 190 mg/dL — ABNORMAL HIGH (ref 70–99)
Glucose-Capillary: 222 mg/dL — ABNORMAL HIGH (ref 70–99)
Glucose-Capillary: 228 mg/dL — ABNORMAL HIGH (ref 70–99)

## 2021-09-29 LAB — CBC
HCT: 26.4 % — ABNORMAL LOW (ref 36.0–46.0)
Hemoglobin: 7.9 g/dL — ABNORMAL LOW (ref 12.0–15.0)
MCH: 29.9 pg (ref 26.0–34.0)
MCHC: 29.9 g/dL — ABNORMAL LOW (ref 30.0–36.0)
MCV: 100 fL (ref 80.0–100.0)
Platelets: 265 10*3/uL (ref 150–400)
RBC: 2.64 MIL/uL — ABNORMAL LOW (ref 3.87–5.11)
RDW: 18.2 % — ABNORMAL HIGH (ref 11.5–15.5)
WBC: 22.3 10*3/uL — ABNORMAL HIGH (ref 4.0–10.5)
nRBC: 0.4 % — ABNORMAL HIGH (ref 0.0–0.2)

## 2021-09-29 LAB — BASIC METABOLIC PANEL
Anion gap: 19 — ABNORMAL HIGH (ref 5–15)
BUN: 94 mg/dL — ABNORMAL HIGH (ref 8–23)
CO2: 21 mmol/L — ABNORMAL LOW (ref 22–32)
Calcium: 8.4 mg/dL — ABNORMAL LOW (ref 8.9–10.3)
Chloride: 96 mmol/L — ABNORMAL LOW (ref 98–111)
Creatinine, Ser: 6.45 mg/dL — ABNORMAL HIGH (ref 0.44–1.00)
GFR, Estimated: 7 mL/min — ABNORMAL LOW (ref >60)
Glucose, Bld: 250 mg/dL — ABNORMAL HIGH (ref 70–99)
Potassium: 4.9 mmol/L (ref 3.5–5.1)
Sodium: 136 mmol/L (ref 135–145)

## 2021-09-29 LAB — HEPARIN LEVEL (UNFRACTIONATED)
Heparin Unfractionated: 0.32 IU/mL (ref 0.30–0.70)
Heparin Unfractionated: 0.28 IU/mL — ABNORMAL LOW (ref 0.30–0.70)
Heparin Unfractionated: 0.23 IU/mL — ABNORMAL LOW (ref 0.30–0.70)

## 2021-09-29 MED ORDER — INSULIN ASPART 100 UNIT/ML IJ SOLN
4.0000 [IU] | INTRAMUSCULAR | Status: DC
  Administered 2021-09-29 – 2021-10-01 (×10): 4 [IU] via SUBCUTANEOUS

## 2021-09-29 MED ORDER — IPRATROPIUM-ALBUTEROL 0.5-2.5 (3) MG/3ML IN SOLN
3.0000 mL | Freq: Two times a day (BID) | RESPIRATORY_TRACT | Status: DC
  Administered 2021-09-30 – 2021-10-02 (×6): 3 mL via RESPIRATORY_TRACT
  Filled 2021-09-29 (×6): qty 3

## 2021-09-29 MED ORDER — INSULIN ASPART 100 UNIT/ML IJ SOLN
3.0000 [IU] | INTRAMUSCULAR | Status: DC
  Administered 2021-09-29: 3 [IU] via SUBCUTANEOUS

## 2021-09-29 NOTE — Progress Notes (Signed)
ANTICOAGULATION CONSULT NOTE  Pharmacy Consult:  Heparin Indication:  concern for ACS in setting of recent stroke  Allergies  Allergen Reactions   Plavix [Clopidogrel Bisulfate] Itching   Codeine Other (See Comments)    "makes me feel strange"   Hydrocodone Other (See Comments)    Feels disorinated   Losartan Other (See Comments)   Tylenol With Codeine #3 [Acetaminophen-Codeine] Other (See Comments)    "doesn't make me feel right"    Patient Measurements: Height: 5\' 4"  (162.6 cm) Weight: 70.8 kg (156 lb 1.4 oz) IBW/kg (Calculated) : 54.7 Heparin Dosing Weight: 70kg  Vital Signs: Temp: 98.4 F (36.9 C) (01/11 2000) Temp Source: Oral (01/11 2000) BP: 124/67 (01/11 2000) Pulse Rate: 94 (01/11 2000)  Labs: Recent Labs    09/27/21 0513 09/27/21 2134 09/28/21 0001 09/28/21 0825 09/28/21 0909 09/28/21 0927 09/28/21 1108 09/28/21 2043 09/29/21 0446 09/29/21 1047 09/29/21 2000  HGB 7.8*  --   --  8.3*  --   --   --   --  7.9*  --   --   HCT 25.4*  --   --  26.8*  --   --   --   --  26.4*  --   --   PLT 257  --   --  327  --   --   --   --  265  --   --   HEPARINUNFRC  --   --   --   --  0.23*  --   --    < > 0.32 0.28* 0.23*  CREATININE 7.71*  --   --  2.25*  --  5.34*  --   --  6.45*  --   --   TROPONINIHS  --    < > 10,661*  --  13,048*  --  10,988*  --   --   --   --    < > = values in this interval not displayed.     Estimated Creatinine Clearance: 8.1 mL/min (A) (by C-G formula based on SCr of 6.45 mg/dL (H)).  Assessment: 71yo female admitted 12/27 with stroke, now with chest pain and elevated troponin.  Pharmacy consulted for heparin dosing.  Plan for heparin x 48 hours (should end 09/30/21) and then transition to [place]. Heparin level subtherapeutic (0.23) on gtt at 1100 units/hr. No issues with line or bleeding reported per RN.  Goal of Therapy:  Heparin level 0.3-0.5 units/ml Monitor platelets by anticoagulation protocol: Yes   Plan:  Increase heparin  gtt to 1200 units/hr - no bolus F/u daily heparin level and CBC F/U with transitioning to DOAC in AM  Sherlon Handing, PharmD, BCPS Please see amion for complete clinical pharmacist phone list 09/29/2021, 8:58 PM

## 2021-09-29 NOTE — Progress Notes (Signed)
Inpatient Diabetes Program Recommendations  AACE/ADA: New Consensus Statement on Inpatient Glycemic Control (2015)  Target Ranges:  Prepandial:   less than 140 mg/dL      Peak postprandial:   less than 180 mg/dL (1-2 hours)      Critically ill patients:  140 - 180 mg/dL    Latest Reference Range & Units 09/27/21 23:42 09/28/21 03:46 09/28/21 07:26 09/28/21 11:24 09/28/21 15:41 09/28/21 19:58  Glucose-Capillary 70 - 99 mg/dL 130 (H)  6 units Novolog  89  4 units Novolog  156 (H)  7 units Novolog  20 units Semglee @0901   190 (H)  5 units Novolog  167 (H)  5 units Novolog  224 (H)  7 units Novolog  20 units Semglee @2117      Latest Reference Range & Units 09/28/21 23:49 09/29/21 04:08 09/29/21 08:10  Glucose-Capillary 70 - 99 mg/dL 292 (H)  10 units Novolog  228 (H)  7 units Novolog  180 (H)  5 units Novolog   (H): Data is abnormally high   Home DM Meds: Basaglar 22 QHS   Current Orders: Novolog 2 units Q4 hours      Novolog Moderate Correction Scale/ SSI (0-15 units) Q4 hours      Semglee 20 units BID    MD- Note patient getting tube feeds 50cc/hr.  CBGs >200 since 8pm last night.  Please consider increasing the Novolog Tube Feed Coverage back to 4 units Q4 hours    --Will follow patient during hospitalization--  Wyn Quaker RN, MSN, CDE Diabetes Coordinator Inpatient Glycemic Control Team Team Pager: [phone] (8a-5p)

## 2021-09-29 NOTE — Progress Notes (Signed)
ANTICOAGULATION CONSULT NOTE  Pharmacy Consult:  Heparin Indication:  concern for ACS in setting of recent stroke  Allergies  Allergen Reactions   Plavix [Clopidogrel Bisulfate] Itching   Codeine Other (See Comments)    "makes me feel strange"   Hydrocodone Other (See Comments)    Feels disorinated   Losartan Other (See Comments)   Tylenol With Codeine #3 [Acetaminophen-Codeine] Other (See Comments)    "doesn't make me feel right"    Patient Measurements: Height: 5\' 4"  (162.6 cm) Weight: 75.2 kg (165 lb 12.6 oz) IBW/kg (Calculated) : 54.7 Heparin Dosing Weight: 70kg  Vital Signs: Temp: 98.2 F (36.8 C) (01/11 0800) Temp Source: Oral (01/11 0800) BP: 151/80 (01/11 1000) Pulse Rate: 99 (01/11 1000)  Labs: Recent Labs    09/27/21 0513 09/27/21 2134 09/28/21 0001 09/28/21 0825 09/28/21 0909 09/28/21 0909 09/28/21 0927 09/28/21 1108 09/28/21 2043 09/29/21 0446 09/29/21 1047  HGB 7.8*  --   --  8.3*  --   --   --   --   --  7.9*  --   HCT 25.4*  --   --  26.8*  --   --   --   --   --  26.4*  --   PLT 257  --   --  327  --   --   --   --   --  265  --   HEPARINUNFRC  --   --   --   --  0.23*   < >  --   --  0.23* 0.32 0.28*  CREATININE 7.71*  --   --  2.25*  --   --  5.34*  --   --  6.45*  --   TROPONINIHS  --    < > 10,661*  --  13,048*  --   --  10,988*  --   --   --    < > = values in this interval not displayed.     Estimated Creatinine Clearance: 8.3 mL/min (A) (by C-G formula based on SCr of 6.45 mg/dL (H)).  Jenna Brown: 71yo female admitted 12/27 with stroke, now with chest pain and elevated troponin.  Pharmacy consulted for heparin dosing.  Confirmatory heparin level is slightly low at 0.28 units/mL.  No issue with infusion nor bleeding per RN.  Goal of Therapy:  Heparin level 0.3-0.5 units/ml Monitor platelets by anticoagulation protocol: Yes   Plan:  Increase heparin gtt to 1100 units/hr - no bolus Daily heparin level and CBC F/U with  transitioning to DOAC in AM  [name] [name], PharmD, BCPS, [place] 09/29/2021, 11:47 AM

## 2021-09-29 NOTE — Progress Notes (Addendum)
STROKE TEAM PROGRESS NOTE   INTERVAL HISTORY No family at the bedside. She is awake alert and oriented and following commands. Remains extubated. Hemodynamically stable and neurological exam remains stable  Vitals:   09/29/21 1310 09/29/21 1330 09/29/21 1400 09/29/21 1430  BP: 139/75 (!) 143/71 125/83 124/72  Pulse: 99 95 (!) 107 98  Resp: 18 (!) 25 (!) 25 (!) 26  Temp:      TempSrc:      SpO2:      Weight:      Height:       CBC:  Recent Labs  Lab 09/28/21 0825 09/29/21 0446  WBC 45.2* 22.3*  HGB 8.3* 7.9*  HCT 26.8* 26.4*  MCV 96.8 100.0  PLT 327 324    Basic Metabolic Panel:  Recent Labs  Lab 09/28/21 0927 09/28/21 1108 09/29/21 0446  NA 134*  --  136  K 3.8  --  4.9  CL 95*  --  96*  CO2 23  --  21*  GLUCOSE 154*  --  250*  BUN 70*  --  94*  CREATININE 5.34*  --  6.45*  CALCIUM 8.5*  --  8.4*  MG 2.4 2.5*  --   PHOS 4.5 4.4  --     Lipid Panel:  No results for input(s): CHOL, TRIG, HDL, CHOLHDL, VLDL, LDLCALC in the last 168 hours.  HgbA1c:  No results for input(s): HGBA1C in the last 168 hours.  Urine Drug Screen:  No results for input(s): LABOPIA, COCAINSCRNUR, LABBENZ, AMPHETMU, THCU, LABBARB in the last 168 hours.   Alcohol Level  No results for input(s): ETH in the last 168 hours.  IMAGING past 24 hours No results found.  PHYSICAL EXAM  Temp:  [97.9 F (36.6 C)-99.7 F (37.6 C)] 97.9 F (36.6 C) (01/11 1303) Pulse Rate:  [92-110] 98 (01/11 1430) Resp:  [13-42] 26 (01/11 1430) BP: (95-151)/(46-89) 124/72 (01/11 1430) SpO2:  [85 %-99 %] 98 % (01/11 1303) Weight:  [73.8 kg-75.2 kg] 73.8 kg (01/11 1303)  General - intubated off sedation, in no acute distress  Cardiovascular - Regular rate and rhythm.  Neurological: Awake alert and interactive. Oriented to person and place.  Eyes are open and follows simple commands. PERRL, tracking bilaterally, no significant gaze palsy but still has left hemianopia, not blinking to visual threat on  the left.  Will move all extremities in response to commands with 5/5 strength in RUE and RLE (right AKA chronic), 3/5 strength in LUE, and LLE 3/5 strength. Sensation and coordination not cooperative, and gait not tested.  ASSESSMENT/PLAN Jenna Brown is a 71 y.o. female with history of ESRD on IHD, NSTEMI, CAD, HTN, HLD, right AKA, stroke and T2DM presenting with left sided weakness, left facial droop and slurred speech.  She was brought to the ED.  TNK was not given as she presented outside the window.  CT angiography revealed LVO in the proximal right ICA with perfusion study showing core infarct of 29cc and penumbra of 168cc in right MCA and PCA territories. Mechanical thrombectomy was performed with TICI 3 flow achieved.  Receiving hemodialysis per nephrology. Extubated 1/7 to [facility]. Remains extubated and doing well from a respiratory standpoint.  Cortrak in place for TF and medications.   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 Bilateral basal ganglia and left cerbellar chronic infarcts, chronic C1-C2  subluxation. ASPECTS 10.    CTA head & neck emergent LVO at proximal right ICA, isolated right M1 segment and fetal type right PCA, left vertebral artery occlusion, stenotic right vertebral artery, high grade mid basilar stenosis CT perfusion 29/197 Status post IR with TICI3 at right ICA.  MRI  acute infarcts in right MCA and PCA territory, punctate foci of restricted diffusion in bilateral forntal and parietal lobes as well as left occipital lobe MRA  resolution of right ICA occlusion  2D Echo EF 25-30%, no LV thrombus LDL 74 HgbA1c 5.5 UDS negative VTE prophylaxis -Heparin subcu aspirin 81 mg daily and Brilinta (ticagrelor) 90 mg bid prior to admission, now IV heparin initiated for NSTEMI over night 09/27/2021, will continue heparin IV for 48 hours if tolerating will  switch to DOAC. Switch brilinta to plavix for 6 months and ASA for 2 weeks to decreasing bleeding risk.  Therapy recommendations:  SNF Disposition:  pending  Respiratory failure Extubated 1/7 to [facility] Management per CCM Currently tolerating well Consider tracheostomy if reintubated  Elevated WBC Fever WBC 15.6 > 19.1->21.4->28.3->37.3->45.2-> 22.3 T-max 100.1-> afebrile Possible aspiration pneumonia.  Cefepime -> vancomycin and Zosyn->zosyn managed by CCM.  Cardiomyopathy 07/2021 EF 20 to 25% This admission EF 25 to 30% Home meds including Imdur and Coreg Currently not on home meds due to hypotension Management per CCM On heparin IV for 48 hours, if tolerating well, will switch to Eliquis  Hx of hypertension Hypotension Home meds:  Coreg 3.125 mg BID BP on the low end Off Levophed Long-term BP goal normotensive Continue midodrine 10 TID  Hyperlipidemia Home meds:  atorvastatin 80 mg daily, resumed in hospital LDL 74, goal < 70 Zetia 10 mg daily and atorvastatin 80mg  Continue statin at discharge  Diabetes Hemoglobin A1c 5.5, uncontrolled Hyperglycemia On insulin SSI CBG monitoring Close PCP follow-up for better DM control Diabetic consult.  NSTEMI Chest pain with significantly elevated troponin Cardiology on board recommends heparin drip for 48 hours and then OK to transition to [place].  Agree with aspirin 81, Plavix, DOAC for 2 weeks and then Plavix and DOAC for 6 months and then DOAC alone.   Continue titrating down midodrine Add metoprolol when bp is stable  Other Stroke Risk Factors Advanced Age >/= 54  Former cigarette smoker Hx stroke Coronary artery disease PVD  Other Active Problems ESRD on IHD - Appreciate nephrology recommendations  Right LE AKA Initial CT showed C1-C2 subluxation with anterior spinal cord compression, similar as prior.  MRI C-spine showed diffuse cervical spine spinal canal stenosis with cord flattening but no cord signal  abnormality. Anemia due to ESRD, hemoglobin 8.5 > 7.9 > 7.5->7.6->8.3-> 7.9  Hospital day # 15  Patient seen and examined by NP/APP with MD. MD to update note as needed.   [place] , MSN, AGACNP-BC [facility] See Amion for schedule and pager information 09/29/2021 2:46 PM  ATTENDING NOTE: I reviewed above note and agree with the assessment and plan. Pt was seen and examined.   No family at bedside.  Patient awake alert, lying in bed, moving all extremities but still with left hemiparesis.  Denies chest pain, WBC improved to 22.3, hemoglobin 7.9.  Discussed with cardiology yesterday, recommend to switch to NOAC tomorrow, Plavix for 6 months and aspirin for 2 weeks.  Continue current management.  For detailed assessment and plan, please refer to above as I have made changes wherever appropriate.   This patient is critically ill due to large  stroke on the right, respiratory failure, ESRD on dialysis, CHF with low EF, non-STEMI, leukocytosis and at significant risk of neurological worsening, death form heart failure, cardiac arrest, recurrent stroke, hemorrhagic transformation, bleeding from heparin, sepsis. This patient's care requires constant monitoring of vital signs, hemodynamics, respiratory and cardiac monitoring, review of multiple databases, neurological assessment, discussion with family, other specialists and medical decision making of high complexity. I spent 35 minutes of neurocritical care time in the care of this patient.  Rosalin Hawking, MD PhD Stroke Neurology 09/29/2021 8:16 PM   To contact Stroke Continuity provider, please refer to [email or web address] After hours, contact General Neurology

## 2021-09-29 NOTE — Progress Notes (Signed)
Physical Therapy Treatment Patient Details Name: Jenna Brown MRN: [id number] DOB: [birth year] Today's Date: 09/29/2021   History of Present Illness 71 yo female presents [facility] on 12/27 with L hemiparesis, slurred speech. 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 Of note,"Patient has atlantodental instability with cord compression and recommend minimizing cervical manipulation especially neck extension; may benefit from neurosurgical evaluation". ETT 12/27-1/7. NSTEMI 1/9. PMH includes recent hospitalization with covid and possible STEMI, end-stage renal disease on hemodialysis, NSTEMI, CAD, HTN, HLD, right AKA 2013, history of TIA/stroke, and type 2 diabetes mellitus.    PT Comments    Pt resting upon arrival to room,  states she is "excited" to sit up. Pt overall requiring max assist for bed-level mobility, but once EOB pt tolerating static and dynamic challenge well. Session cut short because transport arrived to take pt to HD, SNF remains appropriate dispo and PT to continue to follow acutely.    Recommendations for follow up therapy are one component of a multi-disciplinary discharge planning process, led by the attending physician.  Recommendations may be updated based on patient status, additional functional criteria and insurance authorization.  Follow Up Recommendations  Skilled nursing-short term rehab (<3 hours/day)     Assistance Recommended at Discharge Frequent or constant Supervision/Assistance  Patient can return home with the following Two people to help with walking and/or transfers;Two people to help with bathing/dressing/bathroom;Assistance with cooking/housework;Assistance with feeding;Assist for transportation;Direct supervision/assist for financial management;Direct supervision/assist for medications management;Help with stairs or ramp for entrance   Equipment  Recommendations  Hospital bed    Recommendations for Other Services       Precautions / Restrictions Precautions Precautions: Fall Precaution Comments: chronic R BKA Restrictions Weight Bearing Restrictions: No     Mobility  Bed Mobility Overal bed mobility: Needs Assistance Bed Mobility: Supine to Sit;Sit to Supine;Rolling Rolling: Max assist   Supine to sit: Max assist Sit to supine: Max assist;+2 for safety/equipment   General bed mobility comments: max assist for rolling bilat for truncal and LE translation, max +1-2 for trunk and LE management with pt pushing through RUE to move from sidelying to sitting. +2 for return to supine for boost up in bed.    Transfers Overall transfer level: Needs assistance   Transfers: Bed to chair/wheelchair/BSC            Lateral/Scoot Transfers: Total assist;+2 physical assistance      Ambulation/Gait                   Stairs             Wheelchair Mobility    Modified Rankin (Stroke Patients Only) Modified Rankin (Stroke Patients Only) Pre-Morbid Rankin Score: Moderately severe disability Modified Rankin: Severe disability     Balance Overall balance assessment: Needs assistance Sitting-balance support: No upper extremity supported Sitting balance-Leahy Scale: Poor Sitting balance - Comments: able to sit EOB without PT support with bilat UE propping (PT placing LUE in WB for proprioceptive input). Seated balance interventions: AP leaning, lateral elbow propping bilat                                    Cognition Arousal/Alertness: Awake/alert Behavior During Therapy: Restless Overall Cognitive Status: Impaired/Different from baseline Area of Impairment: Following commands;Orientation;Memory;Safety/judgement;Awareness;Problem solving;Attention  Orientation Level: Disoriented to;Situation;Time Current Attention Level: Sustained Memory: Decreased short-term  memory Following Commands: Follows one step commands inconsistently;Follows one step commands with increased time Safety/Judgement: Decreased awareness of safety;Decreased awareness of deficits Awareness: Intellectual Problem Solving: Slow processing;Decreased initiation;Difficulty sequencing;Requires verbal cues;Requires tactile cues General Comments: Pt pleasant, requires frequent cues to stay on given task        Exercises      General Comments        Pertinent Vitals/Pain Faces Pain Scale: Hurts little more Pain Location: buttocks Pain Descriptors / Indicators: Sore;Discomfort Pain Intervention(s): Limited activity within patient's tolerance;Monitored during session;Repositioned    Home Living                          Prior Function            PT Goals (current goals can now be found in the care plan section) Acute Rehab PT Goals Patient Stated Goal: unable to state PT Goal Formulation: Patient unable to participate in goal setting Time For Goal Achievement: 10/05/21 Potential to Achieve Goals: Fair Progress towards PT goals: Progressing toward goals    Frequency    Min 3X/week      PT Plan Current plan remains appropriate    Co-evaluation              AM-PAC PT "6 Clicks" Mobility   Outcome Measure  Help needed turning from your back to your side while in a flat bed without using bedrails?: Total Help needed moving from lying on your back to sitting on the side of a flat bed without using bedrails?: Total Help needed moving to and from a bed to a chair (including a wheelchair)?: Total Help needed standing up from a chair using your arms (e.g., wheelchair or bedside chair)?: Total Help needed to walk in hospital room?: Total Help needed climbing 3-5 steps with a railing? : Total 6 Click Score: 6    End of Session Equipment Utilized During Treatment: Oxygen Activity Tolerance: Patient tolerated treatment well Patient left: in bed;with  call bell/phone within reach;with restraints reapplied Nurse Communication: Mobility status PT Visit Diagnosis: Hemiplegia and hemiparesis Hemiplegia - Right/Left: Left Hemiplegia - dominant/non-dominant: Non-dominant Hemiplegia - caused by: Cerebral infarction     Time: 1233-1249 PT Time Calculation (min) (ACUTE ONLY): 16 min  Charges:  $Therapeutic Activity: 8-22 mins                    Stacie Glaze, PT DPT Acute Rehabilitation Services Pager [phone]  Office [phone]    Roxine Caddy E [name] 09/29/2021, 1:43 PM

## 2021-09-29 NOTE — Progress Notes (Signed)
[facility] Progress Note  Subjective: getting HD    Vitals:   09/29/21 0600 09/29/21 0700 09/29/21 0751 09/29/21 0800  BP: 111/68 124/71  120/71  Pulse: 99 (!) 101  92  Resp: (!) 26 (!) 23  14  Temp:    98.2 F (36.8 C)  TempSrc:    Oral  SpO2: 94% (!) 85% 97% 94%  Weight:      Height:        Physical Exam:   General elderly female in bed  Lungs clear bilat ant/ lat Heart S1S2 no rub Abdomen soft nontender nondistended Extremities right AKA and no left leg edema Neuro - awake and follows simple commands Psych - has mittens still Access:  RUA AVG +b/t      Home meds including:  asa, lipitor, hydralazine 10 tid non hd days, insulin glargine, imdur 15 qd, oxy IR prn, seroquel, renvela 2 ac tidc, coreg 3.125 bid , ticagrelor    OP HD: [facility] M[facility]    3h  70kg  F200 2/2.5 bath  350/600  RUA AVG  Hep 1000 then 500u/hr  - aranesp 35 ug q wk  - venofer 70m weekly   - hectorol 2.5 ug IV tiw  - last HD 12/23      CXR 12/27 - IMPRESSION:  New perihilar and left lower lobe airspace opacity suspicious for aspiration or edema.     Assessment/ Plan: Acute CVA / R ICA occlusion - s/p mech thrombectomy of R ICA 12/27 Acute hypoxic resp failure - sp vent, on nasal cannula now Hypotension - on midodrine 10 mg TID.  +hx HTN on low dose meds at home, have all been on hold here Volume - 2.5 L UF w/ last HD. Vol excess improved. Max UF w/ hd today.  ESRD - HD per MWF schedule. HD today.  Fever/leukocytosis - PNA. On vanc and zosyn. WBC taking a downturn.  NGTD.  Recent +COVID - on last admit; not on precautions here Nutrition - on Nepro TF's H/o CM - LVEF 20-25% last echo Anemia ckd - aranesp increased to 100 mcg weekly and now ordered every Friday.  MBD ckd - resume renvela at 1 TID with meals for now. Started back hectorol with HD. Currently NPO on nepro feeds. Diet per primary team    RKelly Splinter MD 09/29/2021, 10:08 AM     Recent Labs  Lab 09/28/21 0825  09/28/21 0927 09/28/21 1108 09/29/21 0446  K 2.3* 3.8  --  4.9  BUN 24* 70*  --  94*  CREATININE 2.25* 5.34*  --  6.45*  CALCIUM 8.6* 8.5*  --  8.4*  PHOS 1.6* 4.5 4.4  --   HGB 8.3*  --   --  7.9*    Inpatient medications:  aspirin  325 mg Per Tube Daily   Or   aspirin  300 mg Rectal Daily   atorvastatin  80 mg Per Tube Daily   chlorhexidine gluconate (MEDLINE KIT)  15 mL Mouth Rinse BID   Chlorhexidine Gluconate Cloth  6 each Topical Q0600   Chlorhexidine Gluconate Cloth  6 each Topical Q0600   darbepoetin (ARANESP) injection - NON-DIALYSIS  100 mcg Subcutaneous Q Fri-1800   doxercalciferol  2.5 mcg Intravenous Q M,W,F-HD   ezetimibe  10 mg Per Tube Daily   feeding supplement (PROSource TF)  45 mL Per Tube TID   fiber  1 packet Per Tube BID   insulin aspart  0-15 Units Subcutaneous Q4H   insulin  aspart  2 Units Subcutaneous Q4H   insulin glargine-yfgn  20 Units Subcutaneous BID   ipratropium-albuterol  3 mL Nebulization Q6H   mouth rinse  15 mL Mouth Rinse 10 times per day   midodrine  10 mg Oral TID WC   nutrition supplement (JUVEN)  1 packet Per Tube BID BM   pantoprazole sodium  40 mg Per Tube QHS   sevelamer carbonate  0.8 g Oral TID WC    sodium chloride Stopped (09/21/21 1234)   sodium chloride     feeding supplement (VITAL 1.5 CAL) 1,000 mL (09/28/21 1600)   heparin 1,000 Units/hr (09/29/21 0800)   piperacillin-tazobactam (ZOSYN)  IV Stopped (09/29/21 0305)   acetaminophen **OR** acetaminophen (TYLENOL) oral liquid 160 mg/5 mL **OR** acetaminophen, guaiFENesin, loperamide HCl, nitroGLYCERIN, ondansetron (ZOFRAN) IV

## 2021-09-29 NOTE — Progress Notes (Signed)
NAME:  Jenna Brown, MRN:  [id number], DOB:  [birth year], LOS: 2 ADMISSION DATE:  09/14/2021, CONSULTATION DATE:  09/14/2021 REFERRING MD: Dr. Debbrah Alar CHIEF COMPLAINT: Acute R ICA stroke s/p thrombectomy  History of Present Illness:  71 year old woman who presented to [facility] 12/27 with acute R ICA stroke. S/p mechanical thrombectomy 12/28. PMHx significant for HTN, HLD, T2DM, CAD, PVD (s/p R AKA), ESRD on HD. Of note, recent admission for COVID-19 infection (treated with monotherapy as an outpatient); she then developed chest pain with concern for possible NSTEMI.  PCCM consulted for help with vent management post-thrombectomy.  Pertinent Medical History:    has a past medical history of Anemia, Anxiety, Arthritis, Cataract, CKD (chronic kidney disease), stage IV ([place]), Coronary artery disease (2016), Full dentures, GERD (gastroesophageal reflux disease), H/O hiatal hernia, Headache(784.0), Hemorrhoids, AKA (above knee amputation), right ([place]), Hyperlipidemia, Hypertension, Migraines, Myocardial infarction ([place]) (1990's), NSTEMI (non-ST elevated myocardial infarction) ([place]), Peripheral vascular disease ([place]), PONV (postoperative nausea and vomiting), Stroke ([place]), Type II diabetes mellitus ([place]), and Vertigo.   Significant Hospital Events: Including procedures, antibiotic start and stop dates in addition to other pertinent events   12/27 PCCM consult 12/28 CT head with small amount of subarachnoid hyperdensity in posterior right hemisphere, Contrast suspected. MRI CSP moderate to severe spinal canal stenosis MRI/A head Acute infarcts right MCA and PCA territory, most prominent in the right temporal lobe and right occipital lobe.  ECHO EF 25-30%, global hypokinesis, Grade I DD, RVSF normal 12/31 low blood pressure while on dialysis last night.  Was briefly on pressors.  Continues to fail pressure support weans due to desats. Started cefepime 1/3 Failing vent wean 2/2 desaturations. PEEP increased  to 8 (5). HD today per Nephro, longer session given K 6.2. CXR stable. 1/4: received HD; required Neo due to hypotension 1/5: wbc rising; broadened abx to vanc and zosyn.  No cuff leak started on Decadron 1/7 Extubated 1/8 Loose stools. Flexi placed. C. Dif negative   Interim History / Subjective:  Hyperglycemic overnight, otherwise no acute events.   Objective:  Blood pressure 120/71, pulse 92, temperature 98.2 F (36.8 C), temperature source Oral, resp. rate 14, height 5\' 4"  (1.626 m), weight 75.2 kg, SpO2 94 %.        Intake/Output Summary (Last 24 hours) at 09/29/2021 1020 Last data filed at 09/29/2021 0800 Gross per 24 hour  Intake 1705.3 ml  Output --  Net 1705.3 ml    Filed Weights   09/28/21 0300 09/28/21 0715 09/29/21 0500  Weight: 75.4 kg 73.5 kg 75.2 kg   Physical Examination:  Gen:      Elderly female in AND HEENT:  [place]/AT, PERRL Neck:   no JVD, suuple Lungs:    Clear bilateral breath sounds CV:         RRR, no MRG Abd:    Soft, non-tender, non-distended Ext:    Remote AKA R.  Skin:      Grossly intact  Neuro:  Alert, oriented. Tracking. Follows simple commands.    Ancillary tests personally reviewed:    Latest Reference Range & Units 08/16/21 10:26 08/16/21 11:58 09/27/21 21:34 09/28/21 00:01 09/28/21 09:09 09/28/21 11:08  Troponin I (High Sensitivity) <18 ng/L 424 (HH) 381 (HH) 11,257 (HH) 10,661 (HH) 13,048 (HH) 10,988 (HH)  ([place]): Data is critically high   Latest Reference Range & Units 07/06/07 20:46 05/27/11 05:51 05/28/11 05:00 06/01/11 05:10 06/02/11 06:25 06/03/11 06:27 06/04/11 05:00 06/05/11 05:33 06/06/11 06:45 06/07/11 06:35 06/11/11 16:31 03/27/12 14:16 03/29/12  15:07 03/30/12 04:20 03/31/12 03:45 04/01/12 05:45 04/04/12 16:36 04/06/12 01:52 04/08/12 05:40 05/02/12 17:25 05/04/12 06:17 05/09/12 16:09 05/10/12 21:37 05/11/12 05:30 05/12/12 05:50 05/13/12 05:00 04/10/15 08:50 04/10/15 18:24 04/11/15 02:30 04/12/15 04:50 04/12/15 11:40 04/13/15 02:25  04/21/15 03:20 08/11/15 00:41 08/11/15 04:23 08/12/15 03:53 08/13/15 02:18 12/25/16 00:27 12/25/16 04:49 12/26/16 04:00 12/27/16 05:32 12/28/16 03:47 12/29/16 05:12 12/30/16 04:08 12/31/16 04:54 01/01/17 05:22 01/02/17 03:57 01/03/17 04:15 03/24/17 00:09 03/24/17 06:52 03/25/17 06:09 03/26/17 03:10 03/29/17 03:05 03/29/17 23:33 03/30/17 06:05 04/19/17 10:44 01/11/18 03:51 01/12/18 03:35 01/13/18 02:29 01/13/18 15:15 01/14/18 03:06 04/19/18 19:43 04/20/18 03:13 04/21/18 08:29 04/22/18 04:44 04/23/18 06:08 09/14/18 21:05 09/15/18 05:22 09/16/18 05:36 02/26/19 14:45 02/26/19 22:12 03/01/19 18:13 03/04/19 05:58 03/06/19 07:05 08/16/21 10:26 08/16/21 23:04 08/17/21 06:45 08/18/21 01:08 08/18/21 10:21 08/19/21 00:54 08/30/21 17:25 09/14/21 09:09 09/15/21 04:29 09/16/21 04:10 09/17/21 06:14 09/18/21 05:41 09/19/21 02:44 09/20/21 06:52 09/21/21 05:20 09/22/21 04:16 09/23/21 04:36 09/24/21 03:55 09/25/21 06:40 09/26/21 04:50 09/27/21 05:13 09/28/21 08:25  WBC 4.0 - 10.5 K/uL 8.2 8.5 13.9 (H) 9.6 10.0 10.0 10.9 (H) 10.0 10.0 8.3 10.9 (H) 10.3 19.4 (H) 14.3 (H) 13.4 (H) 13.6 (H) 13.2 (H) 18.7 (H) 15.2 (H) 16.7 (H) 20.1 (H) 16.9 (H) 18.3 (H) 16.9 (H) 15.0 (H) 14.8 (H) 11.5 (H) 10.3 9.4 9.1 8.9 9.1 9.1 12.4 (H) 11.0 (H) 8.7 11.7 (H) 11.5 (H) 11.7 (H) 9.5 8.1 8.6 7.9 8.8 8.4 8.3 8.4 9.4 12.0 (H) 10.9 (H) 8.4 9.1 7.7 8.6 9.0 7.9 11.0 (H) 10.2 9.8 9.3 11.4 (H) 11.4 (H) 10.7 (H) 7.9 8.0 9.3 8.8 11.4 (H) 8.8 8.3 7.3 7.3 9.0 17.8 (H) 4.6 5.6 4.4 5.6 6.0 7.6 8.0 10.5 9.2 8.8 12.8 (H) 15.7 (H) 15.4 (H) 13.8 (H) 14.1 (H) 15.6 (H) 19.1 (H) 21.4 (H) 28.3 (H) 37.3 (H) 27.8 (H) 45.2 (H)  (H): Data is abnormally high Assessment & Plan:   Acute ischemic stroke with occlusion of proximal right internal carotid S/p mechanical thrombectomy, (TICI 3), CT head 12/28 with small amount of subarachnoid hyperdensity in posterior right hemisphere, Contrast suspected. 12/28 MRI CSP moderate to severe spinal canal stenosis MRI/A head acute infarcts  right MCA and PCA territory, most prominent in the right temporal lobe and right occipital lobe. P: Stroke service primary AC and antiplatelet outlined below for NSTEMI  KLEBSIELLA ORNITHINOLYTICA pneumonia: 1/2 ceftriaxone<<1/5 broadened abx to zosyn and vanc due to worsening fever/leukocytosis.  COVID-19 Positive- asymptomatic Small LL pleural effusion WBC finally starting to downtrend Repeat cultures from 1/5 all negative. P: DC Zosyn today after 7 day course.   Intermittent hypotension P: Continue midodrine 10mg  TID  End-stage renal disease on hemodialysis (MWF) Hyperkalemia P: Hemodialysis per nephrology  Severe ischemic cardiomyopathy with new NSTEMI HX HTN HX HLD 12/29 ECHO EF 25-30%, global hypokinesis, Grade I DD, RVSF normal. Remains off pressors. P: -BB on hold due to hypotension -ASA, statin, zetia -telemetry monitoring - Medical management: heparin iv 48h then per cards ASA, plavix, DOAC x 2 weeks. Then DOAC and plavix x 6 months then DOAC indefinitely.  DM2 P: Continue SSI and basal insulin Continue tube feed coverage, increase CBG monitoring  GERD P: PPI  Anemia of chronic illness P: Trend CBC Likely in the setting of ESRD, Aranesp  Best Practice: (right click and "Reselect all SmartList Selections" daily)   Diet/type: tubefeeds DVT prophylaxis: SCD GI prophylaxis: PPI Lines: N/A Foley:  N/A Code Status:  full code Last date of multidisciplinary goals of care discussion: Full code   Georgann Housekeeper,  AGACNP-BC [facility]  See Amion for personal pager PCCM on call pager [phone] until 7pm. Please call Elink 7p-7a. [phone]  09/29/2021 11:21 AM         09/29/2021 10:20 AM

## 2021-09-29 NOTE — TOC Progression Note (Signed)
Transition of Care [facility]) - Progression Note    Patient Details  Name: Jenna Brown MRN: [id number] Date of Birth: 03-Mar-1951  Transition of Care [facility]) CM/SW [place], LCSW Phone Number: 09/29/2021, 9:52 AM  Clinical Narrative:    CSW left another voicemail for patient's daughter.   Expected Discharge Plan: Skilled Nursing Facility Barriers to Discharge: Continued Medical Work up, SNF Pending bed offer  Expected Discharge Plan and Services Expected Discharge Plan: [place] In-house Referral: Clinical Social Work   Post Acute Care Choice: [place] Living arrangements for the past 2 months: [place] Determinants of Health (SDOH) Interventions    Readmission Risk Interventions Readmission Risk Prevention Plan 09/27/2021 08/19/2021  Transportation Screening Complete Complete  PCP or Specialist Appt within 3-5 Days - Complete  HRI or [place] - Complete  Social Work Consult for [place] Planning/Counseling - Complete  Palliative Care Screening - Not Applicable  Medication Review Press photographer) Complete Complete  PCP or Specialist appointment within 3-5 days of discharge Complete -  [place] or Home Care Consult Complete -  SW Recovery Care/Counseling Consult Complete -  Palliative Care Screening Complete -  Skilled Nursing Facility Complete -  Some recent data might be hidden

## 2021-09-29 NOTE — Progress Notes (Addendum)
Progress Note  Patient Name: Jenna Brown Date of Encounter: 09/29/2021  [facility] Cardiologist: Skeet Latch, MD   Subjective   Sitting up bed. Awake and following commands. No events overnight.   Inpatient Medications    Scheduled Meds:  aspirin  325 mg Per Tube Daily   Or   aspirin  300 mg Rectal Daily   atorvastatin  80 mg Per Tube Daily   chlorhexidine gluconate (MEDLINE KIT)  15 mL Mouth Rinse BID   Chlorhexidine Gluconate Cloth  6 each Topical Q0600   Chlorhexidine Gluconate Cloth  6 each Topical Q0600   darbepoetin (ARANESP) injection - NON-DIALYSIS  100 mcg Subcutaneous Q Fri-1800   doxercalciferol  2.5 mcg Intravenous Q M,W,F-HD   ezetimibe  10 mg Per Tube Daily   feeding supplement (PROSource TF)  45 mL Per Tube TID   fiber  1 packet Per Tube BID   insulin aspart  0-15 Units Subcutaneous Q4H   insulin aspart  2 Units Subcutaneous Q4H   insulin glargine-yfgn  20 Units Subcutaneous BID   ipratropium-albuterol  3 mL Nebulization Q6H   mouth rinse  15 mL Mouth Rinse 10 times per day   midodrine  10 mg Oral TID WC   nutrition supplement (JUVEN)  1 packet Per Tube BID BM   pantoprazole sodium  40 mg Per Tube QHS   sevelamer carbonate  0.8 g Oral TID WC   Continuous Infusions:  sodium chloride Stopped (09/21/21 1234)   sodium chloride     feeding supplement (VITAL 1.5 CAL) 1,000 mL (09/28/21 1600)   heparin 1,000 Units/hr (09/29/21 0800)   piperacillin-tazobactam (ZOSYN)  IV Stopped (09/29/21 0305)   PRN Meds: acetaminophen **OR** acetaminophen (TYLENOL) oral liquid 160 mg/5 mL **OR** acetaminophen, guaiFENesin, loperamide HCl, nitroGLYCERIN, ondansetron (ZOFRAN) IV   Vital Signs    Vitals:   09/29/21 0600 09/29/21 0700 09/29/21 0751 09/29/21 0800  BP: 111/68 124/71  120/71  Pulse: 99 (!) 101  92  Resp: (!) 26 (!) 23  14  Temp:    98.2 F (36.8 C)  TempSrc:    Oral  SpO2: 94% (!) 85% 97% 94%  Weight:      Height:        Intake/Output  Summary (Last 24 hours) at 09/29/2021 1017 Last data filed at 09/29/2021 0800 Gross per 24 hour  Intake 1705.3 ml  Output --  Net 1705.3 ml   Last 3 Weights 09/29/2021 09/28/2021 09/28/2021  Weight (lbs) 165 lb 12.6 oz 162 lb 0.6 oz 166 lb 3.6 oz  Weight (kg) 75.2 kg 73.5 kg 75.4 kg      Telemetry    SR rates 90s - Personally Reviewed  ECG    No new tracing this morning  Physical Exam   GEN: No acute distress. CorTrak  Neck: No JVD Cardiac: RRR, no murmurs, rubs, or gallops.  Respiratory: Clear to auscultation bilaterally. GI: Soft, nontender, non-distended  MS: Right AKA Neuro:  Nonfocal  Psych: Normal affect   Labs    High Sensitivity Troponin:   Recent Labs  Lab 09/27/21 2134 09/28/21 0001 09/28/21 0909 09/28/21 1108  TROPONINIHS 11,257* 10,661* 13,048* 10,988*     Chemistry Recent Labs  Lab 09/28/21 0825 09/28/21 0927 09/28/21 1108 09/29/21 0446  NA 134* 134*  --  136  K 2.3* 3.8  --  4.9  CL 92* 95*  --  96*  CO2 27 23  --  21*  GLUCOSE 100* 154*  --  250*  BUN 24* 70*  --  94*  CREATININE 2.25* 5.34*  --  6.45*  CALCIUM 8.6* 8.5*  --  8.4*  MG 2.1 2.4 2.5*  --   PROT 7.3 6.9  --   --   ALBUMIN 3.0* 2.8*  --   --   AST 89* 78*  --   --   ALT 78* 69*  --   --   ALKPHOS 82 80  --   --   BILITOT 0.8 0.9  --   --   GFRNONAA 23* 8*  --  7*  ANIONGAP 15 16*  --  19*    Lipids No results for input(s): CHOL, TRIG, HDL, LABVLDL, LDLCALC, CHOLHDL in the last 168 hours.  Hematology Recent Labs  Lab 09/27/21 0513 09/28/21 0825 09/29/21 0446  WBC 27.8* 45.2* 22.3*  RBC 2.63* 2.77* 2.64*  HGB 7.8* 8.3* 7.9*  HCT 25.4* 26.8* 26.4*  MCV 96.6 96.8 100.0  MCH 29.7 30.0 29.9  MCHC 30.7 31.0 29.9*  RDW 15.7* 16.9* 18.2*  PLT 257 327 265   Thyroid No results for input(s): TSH, FREET4 in the last 168 hours.  BNPNo results for input(s): BNP, PROBNP in the last 168 hours.  DDimer No results for input(s): DDIMER in the last 168 hours.   Radiology    No  results found.  Cardiac Studies   Echo 09/15/2021 1. Apical, mid-apical anterior, mid-apical anteroseptal, mid-apical  inferoseptal, mid-apical inferior, and mid-apical inferolateral akinesis.  Otherwise global hypokinesis. Left ventricular ejection fraction, by  estimation, is 25 to 30%. The left  ventricle has severely decreased function. The left ventricle demonstrates  regional wall motion abnormalities (see scoring diagram/findings for  description). The left ventricular internal cavity size was mildly  dilated. There is mild left ventricular  hypertrophy of the basal-septal segment. Left ventricular diastolic  parameters are consistent with Grade I diastolic dysfunction (impaired  relaxation).   2. Right ventricular systolic function is normal. The right ventricular  size is normal.   3. Left atrial size was mildly dilated.   4. The mitral valve is normal in structure. No evidence of mitral valve  regurgitation. No evidence of mitral stenosis. Moderate mitral annular  calcification.   5. The aortic valve is tricuspid. There is mild calcification of the  aortic valve. There is mild thickening of the aortic valve. Aortic valve  regurgitation is not visualized. No aortic stenosis is present.   6. The inferior vena cava is normal in size with <50% respiratory  variability, suggesting right atrial pressure of 8 mmHg.     Patient Profile     71 y.o. female  with hx of known multivessel CAD (no target for PCI & not a CABG candidate), ESRD on HD, s/p R AKA, hypertension, DM, recent COVID-19 infection who was admitted 09/14/21 for acute ischemic/embolic stroke right MCA and PCA with R ICA occlusion status post mechanical thrombectomy on 09/14/21. Patient was extubated on 1/7. Seen by cardiology overnight for chest pain. [place] to have elevated troponin.   Assessment & Plan    NSTEMI: in the setting of recent ischemic stroke. hsTn peaked at [address]. Currently on IV heparin gtt for total  of 48hrs to ensure no complications with hemorrhagic conversion. Then would plan to transition to ASA 83m, plavix (with 3053mload, then 7523maily) along with Eliquis with high risk features for potential LV thrombus for 2 weeks, then continuing with Eliquis and plavix for 6 months. -- statin, Zetia  Acute ischemic Stroke with occlusion of right internal carotid: s/p mechanical thrombectomy  -- management per Stroke team -- currently on ASA 39m daily, see discussion above  ESRD on HD: nephrology following  HFrEF: suspect ICM in the setting of NSTEMI with WMA. GMDT is limited in the setting of hypotension, on midodrine as well as ESRD -- ideally would like to add low dose BB therapy if able to wean from midodrine with stable pressures  PNA/Covid-19/Small L pleural effusion: s/p extubation 1/7, remains on O2 -- antibiotics per primary  DM: on SSI -- unable to add SGLT2   For questions or updates, please contact [place]Please consult [email or web address] for contact info under        Signed, LReino Bellis NP  09/29/2021, 10:17 AM    ATTENDING ATTESTATION:  After conducting a review of all available clinical information with the care team, interviewing the patient, and performing a physical exam, I agree with the findings and plan described in this note.   GEN: No acute distress.   Cardiac: RRR, no murmurs, rubs, or gallops.  Respiratory: Clear to auscultation bilaterally. GI: Soft, nontender, non-distended  MS: No edema; No deformity. Neuro:  Left hemiplegia Vasc:  s/p RAKA  Patient seems improved today.  Multidisciplinary discussion yesterday and will plan to treat with Eliquis/plavix/ASA x 2 weeks, Eliquis/plavix x 6 months, then Eliquis alone for medical treatment of ACS and severe CM with WMA (given high risk for intracardiac thrombus formation).  Allowing for permissive HTN now, will start adding GDMT for ICM tomorrow.    I spent 30 minutes critical care time  evaluating all patient data, interviewing the patient, formulating a treatment plan, and discussing with the treatment team.  ALenna Sciara MD Pager [phone]

## 2021-09-29 NOTE — Progress Notes (Signed)
ANTICOAGULATION CONSULT NOTE  Pharmacy Consult:  Heparin Indication:  concern for ACS in setting of recent stroke  Allergies  Allergen Reactions   Plavix [Clopidogrel Bisulfate] Itching   Codeine Other (See Comments)    "makes me feel strange"   Hydrocodone Other (See Comments)    Feels disorinated   Losartan Other (See Comments)   Tylenol With Codeine #3 [Acetaminophen-Codeine] Other (See Comments)    "doesn't make me feel right"    Patient Measurements: Height: 5\' 4"  (162.6 cm) Weight: 75.2 kg (165 lb 12.6 oz) IBW/kg (Calculated) : 54.7 Heparin Dosing Weight: 70kg  Vital Signs: Temp: 99.4 F (37.4 C) (01/11 0400) Temp Source: Oral (01/11 0400) BP: 136/67 (01/11 0500) Pulse Rate: 102 (01/11 0500)  Labs: Recent Labs    09/27/21 0513 09/27/21 2134 09/28/21 0001 09/28/21 0825 09/28/21 0909 09/28/21 0927 09/28/21 1108 09/28/21 2043 09/29/21 0446  HGB 7.8*  --   --  8.3*  --   --   --   --  7.9*  HCT 25.4*  --   --  26.8*  --   --   --   --  26.4*  PLT 257  --   --  327  --   --   --   --  265  HEPARINUNFRC  --   --   --   --  0.23*  --   --  0.23* 0.32  CREATININE 7.71*  --   --  2.25*  --  5.34*  --   --   --   TROPONINIHS  --    < > 10,661*  --  13,048*  --  10,988*  --   --    < > = values in this interval not displayed.     Estimated Creatinine Clearance: 10 mL/min (A) (by C-G formula based on SCr of 5.34 mg/dL (H)).  Assessment: 71yo female admitted 12/27 with stroke, now with chest pain and elevated troponin.  Pharmacy consulted for heparin dosing.  Heparin level is therapeutic at 0.32 units/mL this AM.  H/H low stable. Plt wnl. No issue with heparin infusion nor bleeding per discussion with RN.   Goal of Therapy:  Heparin level 0.3-0.5 units/ml Monitor platelets by anticoagulation protocol: Yes   Plan:  Continue heparin gtt at 1000 units/hr  F/u level in 6 hours  Daily heparin level and CBC  Albertina Parr, PharmD., BCPS, BCCCP Clinical  Pharmacist Please refer to [facility] for unit-specific pharmacist

## 2021-09-30 DIAGNOSIS — J9600 Acute respiratory failure, unspecified whether with hypoxia or hypercapnia: Secondary | ICD-10-CM

## 2021-09-30 DIAGNOSIS — U071 COVID-19: Secondary | ICD-10-CM

## 2021-09-30 DIAGNOSIS — I639 Cerebral infarction, unspecified: Secondary | ICD-10-CM | POA: Diagnosis not present

## 2021-09-30 DIAGNOSIS — Z794 Long term (current) use of insulin: Secondary | ICD-10-CM

## 2021-09-30 DIAGNOSIS — J150 Pneumonia due to Klebsiella pneumoniae: Secondary | ICD-10-CM | POA: Diagnosis present

## 2021-09-30 DIAGNOSIS — R1310 Dysphagia, unspecified: Secondary | ICD-10-CM

## 2021-09-30 LAB — BASIC METABOLIC PANEL
Anion gap: 16 — ABNORMAL HIGH (ref 5–15)
BUN: 68 mg/dL — ABNORMAL HIGH (ref 8–23)
CO2: 24 mmol/L (ref 22–32)
Calcium: 8.5 mg/dL — ABNORMAL LOW (ref 8.9–10.3)
Chloride: 93 mmol/L — ABNORMAL LOW (ref 98–111)
Creatinine, Ser: 4.89 mg/dL — ABNORMAL HIGH (ref 0.44–1.00)
GFR, Estimated: 9 mL/min — ABNORMAL LOW (ref >60)
Glucose, Bld: 209 mg/dL — ABNORMAL HIGH (ref 70–99)
Potassium: 4.5 mmol/L (ref 3.5–5.1)
Sodium: 133 mmol/L — ABNORMAL LOW (ref 135–145)

## 2021-09-30 LAB — CBC
HCT: 25.6 % — ABNORMAL LOW (ref 36.0–46.0)
Hemoglobin: 7.7 g/dL — ABNORMAL LOW (ref 12.0–15.0)
MCH: 29.7 pg (ref 26.0–34.0)
MCHC: 30.1 g/dL (ref 30.0–36.0)
MCV: 98.8 fL (ref 80.0–100.0)
Platelets: 290 10*3/uL (ref 150–400)
RBC: 2.59 MIL/uL — ABNORMAL LOW (ref 3.87–5.11)
RDW: 18.9 % — ABNORMAL HIGH (ref 11.5–15.5)
WBC: 22.5 10*3/uL — ABNORMAL HIGH (ref 4.0–10.5)
nRBC: 0.2 % (ref 0.0–0.2)

## 2021-09-30 LAB — GLUCOSE, CAPILLARY
Glucose-Capillary: 185 mg/dL — ABNORMAL HIGH (ref 70–99)
Glucose-Capillary: 190 mg/dL — ABNORMAL HIGH (ref 70–99)
Glucose-Capillary: 195 mg/dL — ABNORMAL HIGH (ref 70–99)
Glucose-Capillary: 206 mg/dL — ABNORMAL HIGH (ref 70–99)
Glucose-Capillary: 226 mg/dL — ABNORMAL HIGH (ref 70–99)
Glucose-Capillary: 265 mg/dL — ABNORMAL HIGH (ref 70–99)
Glucose-Capillary: 266 mg/dL — ABNORMAL HIGH (ref 70–99)
Glucose-Capillary: 267 mg/dL — ABNORMAL HIGH (ref 70–99)

## 2021-09-30 LAB — HEPARIN LEVEL (UNFRACTIONATED): Heparin Unfractionated: 0.43 IU/mL (ref 0.30–0.70)

## 2021-09-30 MED ORDER — APIXABAN 5 MG PO TABS
5.0000 mg | ORAL_TABLET | Freq: Two times a day (BID) | ORAL | Status: DC
  Administered 2021-09-30 (×2): 5 mg via ORAL
  Filled 2021-09-30 (×2): qty 1

## 2021-09-30 MED ORDER — ASPIRIN EC 81 MG PO TBEC
81.0000 mg | DELAYED_RELEASE_TABLET | Freq: Every day | ORAL | Status: DC
  Administered 2021-09-30: 81 mg via ORAL
  Filled 2021-09-30: qty 1

## 2021-09-30 MED ORDER — INSULIN GLARGINE-YFGN 100 UNIT/ML ~~LOC~~ SOLN
23.0000 [IU] | Freq: Two times a day (BID) | SUBCUTANEOUS | Status: DC
  Administered 2021-09-30 – 2021-10-02 (×5): 23 [IU] via SUBCUTANEOUS
  Filled 2021-09-30 (×8): qty 0.23

## 2021-09-30 MED ORDER — VITAL 1.5 CAL PO LIQD
780.0000 mL | Freq: Every day | ORAL | Status: DC
  Administered 2021-09-30: 780 mL
  Filled 2021-09-30: qty 1000
  Filled 2021-09-30: qty 948

## 2021-09-30 MED ORDER — NEPRO/CARBSTEADY PO LIQD: 237.0000 mL | Freq: Two times a day (BID) | ORAL | Status: DC

## 2021-09-30 NOTE — Progress Notes (Signed)
Nutrition Follow-up  DOCUMENTATION CODES:   Not applicable  INTERVENTION:   Nocturnal Tube feeding via Cortrak tube:  Vital 1.5 @ 65 ml/h x 12 hours  Run from 1800-0600 (780 ml per day) Prosource TF 45 ml TID  Provides 1290 kcal, 85 gm protein, 912 ml free water daily  Juven BID via tube  Nepro Shake po BID, each supplement provides 425 kcal and 19 grams protein   NUTRITION DIAGNOSIS:   Inadequate oral intake related to inability to eat as evidenced by NPO status. Ongoing  GOAL:   Patient will meet greater than or equal to 90% of their needs Met with TF  MONITOR:   TF tolerance  REASON FOR ASSESSMENT:   Consult Enteral/tube feeding initiation and management  ASSESSMENT:   Pt with PMH of ESRD on iHD, NSTEMI, CAD, HTN, HLD, R AKA, stroke, and DM admitted with R MCA with R ICA occlusion.   Pt discussed during ICU rounds and with RN.  Pt started a PO diet 1/9 but since has not really eaten. Today with SLP pt required maximum assistance/encouragement to eat and eventually only consumed an icee.  Concerned that she will not take enough PO but will adjust her TF to nocturnal to attempt to stimulate her appetite and increase her willingness to eat.  WOC following for DTI to coccyx  Dietetic intern visit reveals that pt ate 50% of mashed potatoes and few bites of carrots at lunch.    Outpatient dry weight: 70 kg  Current weight: 69.6 kg  Pt with L hemiparesis.  Noted wounds  12/27 s/p IR for mechanical thrombectomy 12/29 TF started 1/2 levo weaned off; Renal switched TF to Nepro due to hyperkalemia; pt missed iHD due to staffing issues 1/3 currently receiving iHD 1/6 cortrak tube placed; tip gastric  1/7 extubated 1/9 diet started 1/12   Medications reviewed and include: nutrisource fiber, SSI, novolog, semglee, protonix, renvela packet TID   Labs reviewed: Na 133 CBG's: 149-267  UF: 3000    Diet Order:   Diet Order             DIET - DYS 1 Room  service appropriate? Yes; Fluid consistency: Thin  Diet effective now                   EDUCATION NEEDS:   Not appropriate for education at this time  Skin:  Skin Assessment: Skin Integrity Issues: Skin Integrity Issues:: Stage II DTI: coccyx Stage II: thigh  Last BM:  100 ml via rectal tube  Height:   Ht Readings from Last 1 Encounters:  09/28/21 '5\' 4"'  (1.626 m)    Weight:   Wt Readings from Last 1 Encounters:  09/30/21 69.6 kg    Estimated Nutritional Needs:   Kcal:  1600-1800  Protein:  100-115 grams  Fluid:  1.2 Ld/ay  Lockie Pares., RD, LDN, CNSC See AMiON for contact information

## 2021-09-30 NOTE — Assessment & Plan Note (Addendum)
-   Severe ischemic cardiomyopathy, NSTEMI in the setting of recent ischemic CVA.  Troponins peaked at 13,028 -2D echo showed EF of 25 to 30%, global hypokinesis, grade 1 DD -Cardiology was consulted, treated with IV heparin for 48 hours, then transitioned to aspirin 81 mg daily with eliquis, statin, Zetia -Goals of care addressed, overall poor prognosis, now comfort care

## 2021-09-30 NOTE — Assessment & Plan Note (Addendum)
-   Serial SLP evaluation, currently has core track and on puree/dysphagia 1 diet, unable to advance diet, residual dysphagia from the stroke -Goals of care addressed, now comfort care

## 2021-09-30 NOTE — Progress Notes (Signed)
Speech Language Pathology Treatment: Dysphagia  Patient Details Name: Jenna Brown MRN: [id number] DOB: [birth year] Today's Date: 09/30/2021 Time:  -     Assessment / Plan / Recommendation Clinical Impression  Speech-language pathologist targeted dysphagia goals in functional manner. Patient was presented with puree tray. Patient displayed signs of hunger; communicated wants/needs with SLP. Patient required maximum assistance to engage in drinking, eating, and exploring puree tray. Throughout the session, it is noted that the patient had poor manipulation, formation, and lateralization of novel bolus. Left oral pocketing noted; SLP used tactile oral cheek cues in order to facilitate awareness for oral prepatory phase of swallow. During the session, the patient also required cues in order to not flip the spoon and lick it, but to create an adequate lip seal around utensil and pull her head back to receive a bite. In future sessions, position patient on right side in order to decrease pooling/pocketing and improve oral manipulation of bolus. Continue incorporating positioning and utensil acceptance supports. Patient demonstrated signs of motivation with sweet foods (I.e., freeze pops/ice cream). In future sessions, continue to monitor intake, anterior spillage, buccal pocketing, signs of dysphagia and/or aspiration.    HPI HPI: Jenna Brown is a 71 yo female who presented on 12/27 with L hemiparesis, slurred speech. CT head with small amount of subarachnoid hyperdensity in posterior right hemisphere, Contrast suspected. MRI CSP moderate to severe spinal canal stenosis MRI/A head Acute infarcts right MCA and PCA territory, most prominent in the right temporal lobe and right occipital lobe. Jenna Brown s/p diagnostic cerebral angiogram and mechanical thrombectomy on 09/14/21. ETT 12/27-12/7. Cortrak placed 1/6. PMH: recent hospitalization with COVID and possible STEMI, ESRD on HD, NSTEMI, CAD, HTN, HLD, right AKA 2013,  TIA/stroke, GERD, and type 2 diabetes mellitus.      SLP Plan  Continue with current plan of care      Recommendations for follow up therapy are one component of a multi-disciplinary discharge planning process, led by the attending physician.  Recommendations may be updated based on patient status, additional functional criteria and insurance authorization.    Recommendations  Diet recommendations: Thin liquid;Dysphagia 1 (puree) Liquids provided via: Straw;Cup Medication Administration: Via alternative means Supervision: Staff to assist with self feeding;Full supervision/cueing for compensatory strategies Compensations: Slow rate;Small sips/bites;Minimize environmental distractions;Monitor for anterior loss Postural Changes and/or Swallow Maneuvers: Seated upright 90 degrees                Oral Care Recommendations: Oral care BID Follow Up Recommendations: Skilled nursing-short term rehab (<3 hours/day) Assistance recommended at discharge: Frequent or constant Supervision/Assistance Plan: Continue with current plan of care           [facility]  09/30/2021, 2:44 PM

## 2021-09-30 NOTE — Assessment & Plan Note (Addendum)
Medial deep tissue coccyx pressure injury, present on admission Left upper thigh stage II, not POA -Wound care per nursing

## 2021-09-30 NOTE — Plan of Care (Signed)
°  Problem: Education: Goal: Knowledge of disease or condition will improve Outcome: Progressing Goal: Knowledge of secondary prevention will improve (SELECT ALL) Outcome: Progressing Goal: Knowledge of patient specific risk factors will improve (INDIVIDUALIZE FOR PATIENT) Outcome: Progressing Goal: Individualized Educational Video(s) Outcome: Progressing   Problem: Coping: Goal: Will verbalize positive feelings about self Outcome: Progressing Goal: Will identify appropriate support needs Outcome: Progressing   Problem: Self-Care: Goal: Ability to participate in self-care as condition permits will improve Outcome: Progressing Goal: Verbalization of feelings and concerns over difficulty with self-care will improve Outcome: Progressing Goal: Ability to communicate needs accurately will improve Outcome: Progressing   Problem: Nutrition: Goal: Risk of aspiration will decrease Outcome: Progressing Goal: Dietary intake will improve Outcome: Progressing   Problem: Ischemic Stroke/TIA Tissue Perfusion: Goal: Complications of ischemic stroke/TIA will be minimized Outcome: Progressing

## 2021-09-30 NOTE — Assessment & Plan Note (Signed)
Asymptomatic. 

## 2021-09-30 NOTE — Progress Notes (Signed)
Inpatient Diabetes Program Recommendations  AACE/ADA: New Consensus Statement on Inpatient Glycemic Control (2015)  Target Ranges:  Prepandial:   less than 140 mg/dL      Peak postprandial:   less than 180 mg/dL (1-2 hours)      Critically ill patients:  140 - 180 mg/dL   Lab Results  Component Value Date   GLUCAP 267 (H) 09/30/2021   HGBA1C 5.5 09/15/2021    Review of Glycemic Control  Latest Reference Range & Units 09/30/21 07:23 09/30/21 10:02 09/30/21 11:17  Glucose-Capillary 70 - 99 mg/dL 206 (H) 265 (H) 267 (H)  (H): Data is abnormally high   Inpatient Diabetes Program Recommendations:    Novolog 6 units Q4H tube feeds coverage (stop if feeds are held or discontinued)  Will continue to follow while inpatient.  Thank you, Reche Dixon, MSN, RN Diabetes Coordinator Inpatient Diabetes Program [phone] (team pager from 8a-5p)

## 2021-09-30 NOTE — Progress Notes (Signed)
Pt refusing CPT at this time.

## 2021-09-30 NOTE — Assessment & Plan Note (Addendum)
-   In the setting of Klebsiella pneumonia, COVID-19, small left lung pleural effusion  -Completed antibiotics (Zosyn) after 7-day course on 1/11

## 2021-09-30 NOTE — Assessment & Plan Note (Addendum)
Now comfort care 

## 2021-09-30 NOTE — Progress Notes (Addendum)
Progress Note  Patient Name: Jenna Brown Date of Encounter: 09/30/2021  [facility] Cardiologist: Skeet Latch, MD   Subjective   Sitting up in bed.   Inpatient Medications    Scheduled Meds:  apixaban  5 mg Oral BID   aspirin EC  81 mg Oral Daily   atorvastatin  80 mg Per Tube Daily   chlorhexidine gluconate (MEDLINE KIT)  15 mL Mouth Rinse BID   Chlorhexidine Gluconate Cloth  6 each Topical Q0600   Chlorhexidine Gluconate Cloth  6 each Topical Q0600   darbepoetin (ARANESP) injection - NON-DIALYSIS  100 mcg Subcutaneous Q Fri-1800   doxercalciferol  2.5 mcg Intravenous Q M,W,F-HD   ezetimibe  10 mg Per Tube Daily   feeding supplement (PROSource TF)  45 mL Per Tube TID   fiber  1 packet Per Tube BID   insulin aspart  0-15 Units Subcutaneous Q4H   insulin aspart  4 Units Subcutaneous Q4H   insulin glargine-yfgn  20 Units Subcutaneous BID   ipratropium-albuterol  3 mL Nebulization BID   mouth rinse  15 mL Mouth Rinse 10 times per day   midodrine  10 mg Oral TID WC   nutrition supplement (JUVEN)  1 packet Per Tube BID BM   pantoprazole sodium  40 mg Per Tube QHS   sevelamer carbonate  0.8 g Oral TID WC   Continuous Infusions:  sodium chloride Stopped (09/21/21 1234)   sodium chloride     feeding supplement (VITAL 1.5 CAL) 1,000 mL (09/29/21 1741)   PRN Meds: acetaminophen **OR** acetaminophen (TYLENOL) oral liquid 160 mg/5 mL **OR** acetaminophen, guaiFENesin, loperamide HCl, nitroGLYCERIN, ondansetron (ZOFRAN) IV   Vital Signs    Vitals:   09/30/21 0500 09/30/21 0600 09/30/21 0731 09/30/21 0800  BP: 136/84 136/72    Pulse: 93 89    Resp: (!) 24 (!) 23    Temp:    98.7 F (37.1 C)  TempSrc:    Axillary  SpO2: 98% 97% 98%   Weight:      Height:        Intake/Output Summary (Last 24 hours) at 09/30/2021 1034 Last data filed at 09/30/2021 2671 Gross per 24 hour  Intake 479.09 ml  Output 3100 ml  Net -2620.91 ml   Last 3 Weights 09/30/2021 09/29/2021  09/29/2021  Weight (lbs) 153 lb 7 oz 156 lb 1.4 oz 162 lb 11.2 oz  Weight (kg) 69.6 kg 70.8 kg 73.8 kg      Telemetry    SR-ST at times - Personally Reviewed  ECG    No new tracing  Physical Exam   GEN: No acute distress. CorTrak in place Neck: No JVD Cardiac: RRR, no murmurs, rubs, or gallops.  Respiratory: Clear to auscultation bilaterally. GI: Soft, nontender, non-distended  MS: Right AKA Neuro:  Nonfocal  Psych: Normal affect   Labs    High Sensitivity Troponin:   Recent Labs  Lab 09/27/21 2134 09/28/21 0001 09/28/21 0909 09/28/21 1108  TROPONINIHS 11,257* 10,661* 13,048* 10,988*     Chemistry Recent Labs  Lab 09/28/21 0825 09/28/21 0927 09/28/21 1108 09/29/21 0446 09/30/21 0418  NA 134* 134*  --  136 133*  K 2.3* 3.8  --  4.9 4.5  CL 92* 95*  --  96* 93*  CO2 27 23  --  21* 24  GLUCOSE 100* 154*  --  250* 209*  BUN 24* 70*  --  94* 68*  CREATININE 2.25* 5.34*  --  6.45* 4.89*  CALCIUM 8.6* 8.5*  --  8.4* 8.5*  MG 2.1 2.4 2.5*  --   --   PROT 7.3 6.9  --   --   --   ALBUMIN 3.0* 2.8*  --   --   --   AST 89* 78*  --   --   --   ALT 78* 69*  --   --   --   ALKPHOS 82 80  --   --   --   BILITOT 0.8 0.9  --   --   --   GFRNONAA 23* 8*  --  7* 9*  ANIONGAP 15 16*  --  19* 16*    Lipids No results for input(s): CHOL, TRIG, HDL, LABVLDL, LDLCALC, CHOLHDL in the last 168 hours.  Hematology Recent Labs  Lab 09/28/21 0825 09/29/21 0446 09/30/21 0418  WBC 45.2* 22.3* 22.5*  RBC 2.77* 2.64* 2.59*  HGB 8.3* 7.9* 7.7*  HCT 26.8* 26.4* 25.6*  MCV 96.8 100.0 98.8  MCH 30.0 29.9 29.7  MCHC 31.0 29.9* 30.1  RDW 16.9* 18.2* 18.9*  PLT 327 265 290   Thyroid No results for input(s): TSH, FREET4 in the last 168 hours.  BNPNo results for input(s): BNP, PROBNP in the last 168 hours.  DDimer No results for input(s): DDIMER in the last 168 hours.   Radiology    No results found.  Cardiac Studies   Echo 09/15/2021 1. Apical, mid-apical anterior,  mid-apical anteroseptal, mid-apical  inferoseptal, mid-apical inferior, and mid-apical inferolateral akinesis.  Otherwise global hypokinesis. Left ventricular ejection fraction, by  estimation, is 25 to 30%. The left  ventricle has severely decreased function. The left ventricle demonstrates  regional wall motion abnormalities (see scoring diagram/findings for  description). The left ventricular internal cavity size was mildly  dilated. There is mild left ventricular  hypertrophy of the basal-septal segment. Left ventricular diastolic  parameters are consistent with Grade I diastolic dysfunction (impaired  relaxation).   2. Right ventricular systolic function is normal. The right ventricular  size is normal.   3. Left atrial size was mildly dilated.   4. The mitral valve is normal in structure. No evidence of mitral valve  regurgitation. No evidence of mitral stenosis. Moderate mitral annular  calcification.   5. The aortic valve is tricuspid. There is mild calcification of the  aortic valve. There is mild thickening of the aortic valve. Aortic valve  regurgitation is not visualized. No aortic stenosis is present.   6. The inferior vena cava is normal in size with <50% respiratory  variability, suggesting right atrial pressure of 8 mmHg.     Patient Profile     71 y.o. female with hx of known multivessel CAD (no target for PCI & not a CABG candidate), ESRD on HD, s/p R AKA, hypertension, DM, recent COVID-19 infection who was admitted 09/14/21 for acute ischemic/embolic stroke right MCA and PCA with R ICA occlusion status post mechanical thrombectomy on 09/14/21. Patient was extubated on 1/7. Seen by cardiology overnight for chest pain. Found to have elevated troponin  Assessment & Plan    NSTEMI: in the setting of recent ischemic stroke. hsTn [place] at [address]. Treated with IV heparin gtt for total of 48hrs to ensure no complications with hemorrhagic conversion. Now transitioned to ASA  31m daily, along with Eliquis with high risk features for potential LV thrombus per discussion with MD. -- statin, Zetia    Acute ischemic Stroke with occlusion of right internal carotid: s/p mechanical thrombectomy  --  management per Stroke team -- ASA transitioned to 74m daily    ESRD on HD: nephrology following   HFrEF: suspect ICM in the setting of NSTEMI with WMA. EF of 25-30%. GMDT is limited in the setting of hypotension, on midodrine as well as ESRD -- ideally would like to add low dose BB therapy if able to wean from midodrine with stable pressures. Remains on midodrine 114mTID. Allowing for permissive HTN per neurology   PNA/Covid-19/Small L pleural effusion: s/p extubation 1/7, remains on O2 -- antibiotics per primary   DM: on SSI -- unable to add SGLT2   Anemia of chronic illness: Hgb remains 7.5-8 range -- follow CBC with initiation of Eliquis   For questions or updates, please contact [facility] consult [email or web address] for contact info under        Signed, LiReino BellisNP  09/30/2021, 10:34 AM    ATTENDING ATTESTATION:  After conducting a review of all available clinical information with the care team, interviewing the patient, and performing a physical exam, I agree with the findings and plan described in this note.   GEN: No acute distress.   Cardiac: RRR, no murmurs, rubs, or gallops.  Respiratory: Clear to auscultation bilaterally. GI: Soft, nontender, non-distended  MS: No edema; No deformity. Neuro:  Dense hemiplegia Vasc:  +2 radial pulses  Patient remains stable without hemorrhagic conversion after hep gtt. had a long conversation with neurology attending given the severe left ventricular dysfunction with segmental wall motion abnormalities this is likely the source of her thromboembolism leading to her stroke.  For this reason anticoagulation would be beneficial.  I think she is a relatively safe anticoagulation candidate since she is  sedentary and does not ambulate due to her peripheral vascular disease.  Normally given the fact that we are medically treating an acute coronary syndrome we will place the patient on Plavix and aspirin along with her DOAC.  Unfortunately due to her Plavix allergy she is unable to receive Plavix.  Brilinta and a DOAC associated with very high bleeding risk.  For this reason and no this is unconventional I think we should treat the patient with aspirin 81 mg and Eliquis.  I spent 30 minutes critical care time evaluating all patient data, interviewing the patient, formulating a treatment plan, and discussing with the treatment team.   ArLenna SciaraMD Pager [phone]

## 2021-09-30 NOTE — Hospital Course (Addendum)
71 year old woman who presented to [facility] 12/27 with left-sided weakness, left facial droop and slurred speech, found to have acute right ICA stroke, s/p mechanical thrombectomy 12/28. PMHx significant for HTN, HLD, T2DM, CAD, PVD (s/p R AKA), ESRD on HD MWF. Of note, recent admission for COVID-19 infection (treated with monotherapy as an outpatient); she then developed chest pain with concern for possible NSTEMI.  TRH requested to assume care on 1/12, plan to transfer out of ICU.  -stable from neuro standpoint.  Still has dysphagia, on cortrack for tube feeds -Extubated on 1/7, O2 sats 95% on 2 L 1/14: Worsening NIH scale, repeat CT head.  Left foot toes gangrene concerning for infectious, ischemia.  Vascular surgery consulted. 1/15: Goals of care addressed, complete comfort care status

## 2021-09-30 NOTE — Progress Notes (Signed)
ANTICOAGULATION CONSULT NOTE  Pharmacy Consult:  Heparin >> apixaban Indication:  concern for ACS in setting of recent stroke  Allergies  Allergen Reactions   Plavix [Clopidogrel Bisulfate] Itching   Codeine Other (See Comments)    "makes me feel strange"   Hydrocodone Other (See Comments)    Feels disorinated   Losartan Other (See Comments)   Tylenol With Codeine #3 [Acetaminophen-Codeine] Other (See Comments)    "doesn't make me feel right"    Patient Measurements: Height: 5\' 4"  (162.6 cm) Weight: 69.6 kg (153 lb 7 oz) IBW/kg (Calculated) : 54.7 Heparin Dosing Weight: 70kg  Vital Signs: Temp: 98.7 F (37.1 C) (01/12 0800) Temp Source: Axillary (01/12 0800) BP: 136/72 (01/12 0600) Pulse Rate: 89 (01/12 0600)  Labs: Recent Labs     0000 09/28/21 0001 09/28/21 0825 09/28/21 0909 09/28/21 0927 09/28/21 1108 09/28/21 2043 09/29/21 0446 09/29/21 1047 09/29/21 2000 09/30/21 0418  HGB   < >  --  8.3*  --   --   --   --  7.9*  --   --  7.7*  HCT  --   --  26.8*  --   --   --   --  26.4*  --   --  25.6*  PLT  --   --  327  --   --   --   --  265  --   --  290  HEPARINUNFRC  --   --   --  0.23*  --   --    < > 0.32 0.28* 0.23* 0.43  CREATININE  --   --  2.25*  --  5.34*  --   --  6.45*  --   --   --   TROPONINIHS  --  10,661*  --  13,048*  --  10,988*  --   --   --   --   --    < > = values in this interval not displayed.     Estimated Creatinine Clearance: 8 mL/min (A) (by C-G formula based on SCr of 6.45 mg/dL (H)).  Assessment: 71yo female with hx of ESRD on HD admitted 12/27 with stroke, now with chest pain and elevated troponin. Pharmacy consulted for heparin dosing x 48 hours (should end 09/30/21) and then transition to [place].  Per discussion this morning between Dr. Erlinda Hong of Neurology and Dr. Ali Lowe of Cardiology, decision made to transition heparin to apixaban/baby aspirin only to minimize bleeding risk. Dr. Erlinda Hong would like to make the transition today. Hg low but  stable, plt wnl. No bleed issues reported. Weight 69.6kg.  Goal of Therapy:  Heparin level 0.3-0.5 units/ml Monitor platelets by anticoagulation protocol: Yes   Plan:  D/c heparin infusion at time of 1st apixaban 5mg  PO BID dose - communicated plan with RN Transition aspirin 325mg  daily to 81mg  daily per Neuro recommendations Monitor CBC, s/sx bleeding   Arturo Morton, PharmD, BCPS Please check AMION for all [place] contact numbers Clinical Pharmacist 09/30/2021 9:30 AM

## 2021-09-30 NOTE — Progress Notes (Signed)
[facility] Progress Note  Subjective: seen in room, no c/o   Vitals:   09/30/21 0800 09/30/21 0900 09/30/21 1000 09/30/21 1100  BP: 140/78 128/87 101/84 135/86  Pulse: (!) 101 (!) 110 (!) 110   Resp: (!) _0 Temp: 98.7 F (37.1 C)     TempSrc: Axillary     SpO2: 95% (!) 83% 96%   Weight:      Height:        Physical Exam:   General elderly female in bed  Lungs clear bilat ant/ lat Heart S1S2 no rub Abdomen soft nontender nondistended Extremities right AKA and no left leg edema Neuro - awake and follows some commands Access:  RUA AVG +b/t, Ox 1      Home meds including:  asa, lipitor, hydralazine 10 tid non hd days, insulin glargine, imdur 15 qd, oxy IR prn, seroquel, renvela 2 ac tidc, coreg 3.125 bid , ticagrelor    OP HD: [facility] MWF    3h  70kg  F200 2/2.5 bath  350/600  RUA AVG  Hep 1000 then 500u/hr  - aranesp 35 ug q wk  - venofer 83m weekly   - hectorol 2.5 ug IV tiw  - last HD 12/23      CXR 12/27 - IMPRESSION:  New perihilar and left lower lobe airspace opacity suspicious for aspiration or edema.     Assessment/ Plan: Acute CVA / R ICA occlusion - s/p mech thrombectomy of R ICA 12/27 Acute hypoxic resp failure - sp vent, on nasal cannula now Hypotension - on midodrine 10 mg TID.  +hx HTN on low dose meds at home, have all been on hold here Volume - 3 L off w/ HD yest, 1kg under dry today ESRD - HD per MWF schedule. HD today.  Fever/leukocytosis - PNA. On vanc and zosyn. WBC taking a downturn.  NGTD.  Recent +COVID - on last admit; not on precautions here Nutrition - on Nepro TF's H/o CM - LVEF 20-25% last echo Anemia ckd - aranesp increased to 100 mcg weekly and now ordered every Friday.  MBD ckd - resume renvela at 1 TID with meals for now. Started back hectorol with HD. Currently NPO on nepro feeds. Diet per primary team    RKelly Splinter MD 09/30/2021, 12:03 PM     Recent Labs  Lab 09/28/21 0927 09/28/21 1108 09/29/21 0446  09/30/21 0418  K 3.8  --  4.9 4.5  BUN 70*  --  94* 68*  CREATININE 5.34*  --  6.45* 4.89*  CALCIUM 8.5*  --  8.4* 8.5*  PHOS 4.5 4.4  --   --   HGB  --   --  7.9* 7.7*    Inpatient medications:  apixaban  5 mg Oral BID   aspirin EC  81 mg Oral Daily   atorvastatin  80 mg Per Tube Daily   chlorhexidine gluconate (MEDLINE KIT)  15 mL Mouth Rinse BID   Chlorhexidine Gluconate Cloth  6 each Topical Q0600   Chlorhexidine Gluconate Cloth  6 each Topical Q0600   darbepoetin (ARANESP) injection - NON-DIALYSIS  100 mcg Subcutaneous Q Fri-1800   doxercalciferol  2.5 mcg Intravenous Q M,W,F-HD   ezetimibe  10 mg Per Tube Daily   feeding supplement (PROSource TF)  45 mL Per Tube TID   fiber  1 packet Per Tube BID   insulin aspart  0-15 Units Subcutaneous Q4H   insulin aspart  4 Units Subcutaneous Q4H  insulin glargine-yfgn  20 Units Subcutaneous BID   ipratropium-albuterol  3 mL Nebulization BID   mouth rinse  15 mL Mouth Rinse 10 times per day   midodrine  10 mg Oral TID WC   nutrition supplement (JUVEN)  1 packet Per Tube BID BM   pantoprazole sodium  40 mg Per Tube QHS   sevelamer carbonate  0.8 g Oral TID WC    sodium chloride Stopped (09/21/21 1234)   sodium chloride     feeding supplement (VITAL 1.5 CAL) 1,000 mL (09/29/21 1741)   acetaminophen **OR** acetaminophen (TYLENOL) oral liquid 160 mg/5 mL **OR** acetaminophen, guaiFENesin, loperamide HCl, nitroGLYCERIN, ondansetron (ZOFRAN) IV

## 2021-09-30 NOTE — Progress Notes (Signed)
Occupational Therapy Treatment Patient Details Name: Jenna Brown MRN: [id number] DOB: August 18, 1951 Today's Date: 09/30/2021   History of present illness 71 yo female presents to [facility] on 12/27 with L hemiparesis, slurred speech. 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 Of note,"Patient has atlantodental instability with cord compression and recommend minimizing cervical manipulation especially neck extension; may benefit from neurosurgical evaluation". ETT 12/27-1/7. NSTEMI 1/9. PMH includes recent hospitalization with covid and possible STEMI, end-stage renal disease on hemodialysis, NSTEMI, CAD, HTN, HLD, right AKA 2013, history of TIA/stroke, and type 2 diabetes mellitus.   OT comments  Patient seen in conjunction with PT this date.  Patient recently extubated, increased alertness and ability to participate.  Weakness, fair cognition and pain are the primary deficits.  Focus of treatment was sitting balance, upper body ADL, attention to the L visual field.  Patient needing up to +2 for basic mobility, and use of overhead lift to transfer to the recliner.  OT to continue efforts in the acute setting, with SNF being recommended for post acute rehab and determination of final discharge disposition.     Recommendations for follow up therapy are one component of a multi-disciplinary discharge planning process, led by the attending physician.  Recommendations may be updated based on patient status, additional functional criteria and insurance authorization.    Follow Up Recommendations  Skilled nursing-short term rehab (<3 hours/day)    Assistance Recommended at Discharge Frequent or constant Supervision/Assistance  Patient can return home with the following      Equipment Recommendations  BSC/3in1;Wheelchair (measurements OT);Wheelchair cushion (measurements OT);Hospital bed    Recommendations  for Other Services      Precautions / Restrictions Precautions Precautions: Fall Precaution Comments: chronic R BKA Restrictions Weight Bearing Restrictions: No       Mobility Bed Mobility Overal bed mobility: Needs Assistance Bed Mobility: Sidelying to Sit;Rolling Rolling: Max assist Sidelying to sit: Max assist         Patient Response: Restless  Transfers Overall transfer level: Needs assistance   Transfers: Bed to chair/wheelchair/BSC             General transfer comment: overhead lift used.  No drop arm recliner in the room     Balance Overall balance assessment: Needs assistance Sitting-balance support: No upper extremity supported Sitting balance-Leahy Scale: Poor   Postural control: Posterior lean;Right lateral lean                                 ADL either performed or assessed with clinical judgement   ADL       Grooming: Wash/dry hands;Oral care;Moderate assistance;Sitting           Upper Body Dressing : Moderate assistance;Sitting   Lower Body Dressing: Maximal assistance;Bed level               Functional mobility during ADLs: Total assistance      Extremity/Trunk Assessment Upper Extremity Assessment RUE Deficits / Details: AROM and functionally using during session LUE Deficits / Details: no AROM noted LUE Sensation: decreased light touch LUE Coordination: decreased fine motor;decreased gross motor   Lower Extremity Assessment Lower Extremity Assessment: Defer to PT evaluation   Cervical / Trunk Assessment Cervical / Trunk Assessment: Kyphotic    Vision       Perception Perception Perception: Impaired Inattention/Neglect: Does not attend to left visual field;Does  not attend to left side of body   Praxis Praxis Praxis: Not tested    Cognition Arousal/Alertness: Awake/alert Behavior During Therapy: Restless Overall Cognitive Status: No family/caregiver present to determine baseline cognitive  functioning                     Current Attention Level: Sustained Memory: Decreased short-term memory Following Commands: Follows one step commands inconsistently;Follows one step commands with increased time Safety/Judgement: Decreased awareness of safety;Decreased awareness of deficits Awareness: Intellectual Problem Solving: Slow processing;Decreased initiation;Difficulty sequencing;Requires verbal cues;Requires tactile cues                              Pertinent Vitals/ Pain       Pain Assessment: Faces Faces Pain Scale: Hurts little more Pain Location: buttocks/rectal tube Pain Descriptors / Indicators: Sore;Discomfort Pain Intervention(s): Monitored during session                                                          Frequency  Min 2X/week        Progress Toward Goals  OT Goals(current goals can now be found in the care plan section)     Acute Rehab OT Goals OT Goal Formulation: Patient unable to participate in goal setting Time For Goal Achievement: 10/04/21 Potential to Achieve Goals: [place] Discharge plan remains appropriate;Frequency remains appropriate    Co-evaluation    PT/OT/SLP Co-Evaluation/Treatment: Yes Reason for Co-Treatment: Complexity of the patient's impairments (multi-system involvement);To address functional/ADL transfers   OT goals addressed during session: ADL's and self-care      AM-PAC OT "6 Clicks" Daily Activity     Outcome Measure   Help from another person eating meals?: Total Help from another person taking care of personal grooming?: A Lot Help from another person toileting, which includes using toliet, bedpan, or urinal?: Total Help from another person bathing (including washing, rinsing, drying)?: A Lot Help from another person to put on and taking off regular upper body clothing?: A Lot Help from another person to put on and taking off regular lower body clothing?: A  Lot 6 Click Score: 10    End of Session    OT Visit Diagnosis: Muscle weakness (generalized) (M62.81)   Activity Tolerance Patient limited by pain   Patient Left in chair;with call bell/phone within reach;with chair alarm set   Nurse Communication Mobility status;Precautions        Time: 1547-1610 OT Time Calculation (min): 23 min  Charges: OT General Charges $OT Visit: 1 Visit OT Treatments $Self Care/Home Management : 8-22 mins  09/30/2021  RP, OTR/L  Acute Rehabilitation Services  Office:  [phone]   Metta Clines 09/30/2021, 4:32 PM

## 2021-09-30 NOTE — TOC Progression Note (Signed)
Transition of Care [facility]) - Progression Note    Patient Details  Name: Jenna Brown MRN: [id number] Date of Birth: 12/04/1950  Transition of Care [facility]) CM/SW [place], LCSW Phone Number: 09/30/2021, 4:35 PM  Clinical Narrative:    CSW has not heard back from patient's daughter. CSW spoke with patient at bedside regarding SNF. Patient reported she did not want to go to a SNF but wanted to go home instead, where Ruby is who can provide support. CSW unsure how much insight patient has into her physical abilities. Patient reported [employer] transports her to dialysis. She provided CSW with permission to contact her family. CSW left voicemail for patient's cousin, Bertram Millard. CSW contacted Ivin Booty, patient's sister. Ivin Booty reported that patient does reside with their Aunt (who raised patient) and cousin, Ruby. She stated that they are both elderly and are not able to physically assist patient. She reported that she knows patient did not like being in a facility last time [facility]) but that she will have to go. CSW will send out SNF referral once Cortrak removed.    Expected Discharge Plan: Skilled Nursing Facility Barriers to Discharge: Continued Medical Work up, SNF Pending bed offer  Expected Discharge Plan and Services Expected Discharge Plan: [place] In-house Referral: Clinical Social Work   Post Acute Care Choice: [place] Living arrangements for the past 2 months: [place] Determinants of Health (SDOH) Interventions    Readmission Risk Interventions Readmission Risk Prevention Plan 09/27/2021 08/19/2021  Transportation Screening Complete Complete  PCP or Specialist Appt within 3-5 Days - Complete  [place] or [place] - Complete  Social Work Consult for [place] Planning/Counseling - Complete  Palliative Care Screening - Not Applicable  Medication Review Human resources officer) Complete Complete  PCP or Specialist appointment within 3-5 days of discharge Complete -  [place] or Home Care Consult Complete -  SW Recovery Care/Counseling Consult Complete -  Palliative Care Screening Complete -  Skilled Nursing Facility Complete -  Some recent data might be hidden

## 2021-09-30 NOTE — Assessment & Plan Note (Addendum)
-   Patient was placed on insulin regimen on tube feeds -Now comfort feeds

## 2021-09-30 NOTE — Assessment & Plan Note (Addendum)
-   Initially was placed on ceftriaxone, then transition to IV vancomycin and Zosyn  - Completed antibiotics (Zosyn) after 7-day course -Now comfort care

## 2021-09-30 NOTE — Assessment & Plan Note (Addendum)
-   Hemodialysis discontinued in the light of complete comfort care status.  Overall poor prognosis.

## 2021-09-30 NOTE — Discharge Instructions (Signed)

## 2021-09-30 NOTE — Progress Notes (Addendum)
STROKE TEAM PROGRESS NOTE   INTERVAL HISTORY No family at the bedside. She is awake alert and oriented and following commands. Remains extubated. Hemodynamically stable and neurological exam remains stable.  Will transfer out of the ICU today.  Vitals:   09/30/21 0800 09/30/21 0900 09/30/21 1000 09/30/21 1100  BP:  128/87 101/84 135/86  Pulse:  (!) 110 (!) 110   Resp:   19 16  Temp: 98.7 F (37.1 C)     TempSrc: Axillary     SpO2:  (!) 83% 96%   Weight:      Height:       CBC:  Recent Labs  Lab 09/29/21 0446 09/30/21 0418  WBC 22.3* 22.5*  HGB 7.9* 7.7*  HCT 26.4* 25.6*  MCV 100.0 98.8  PLT 265 950    Basic Metabolic Panel:  Recent Labs  Lab 09/28/21 0927 09/28/21 1108 09/29/21 0446 09/30/21 0418  NA 134*  --  136 133*  K 3.8  --  4.9 4.5  CL 95*  --  96* 93*  CO2 23  --  21* 24  GLUCOSE 154*  --  250* 209*  BUN 70*  --  94* 68*  CREATININE 5.34*  --  6.45* 4.89*  CALCIUM 8.5*  --  8.4* 8.5*  MG 2.4 2.5*  --   --   PHOS 4.5 4.4  --   --     Lipid Panel:  No results for input(s): CHOL, TRIG, HDL, CHOLHDL, VLDL, LDLCALC in the last 168 hours.  HgbA1c:  No results for input(s): HGBA1C in the last 168 hours.  Urine Drug Screen:  No results for input(s): LABOPIA, COCAINSCRNUR, LABBENZ, AMPHETMU, THCU, LABBARB in the last 168 hours.   Alcohol Level  No results for input(s): ETH in the last 168 hours.  IMAGING past 24 hours No results found.  PHYSICAL EXAM  Temp:  [97.9 F (36.6 C)-98.7 F (37.1 C)] 98.7 F (37.1 C) (01/12 0800) Pulse Rate:  [49-111] 110 (01/12 1000) Resp:  [16-29] 16 (01/12 1100) BP: (100-144)/(49-89) 135/86 (01/12 1100) SpO2:  [83 %-99 %] 96 % (01/12 1000) Weight:  [69.6 kg-73.8 kg] 69.6 kg (01/12 0411)  General - Alert, well-developed, well-nourished female in no acute distress  Cardiovascular - Regular rate and rhythm.  Neurological: Awake alert and interactive. Oriented to person and place.  Eyes are open and follows simple  commands. PERRL, tracking bilaterally, no significant gaze palsy but still has left hemianopia, not blinking to visual threat on the left.  Will move all extremities in response to commands with 5/5 strength in RUE and RLE (right AKA chronic), 3/5 strength in LUE, and LLE 3/5 strength. Sensation and coordination not cooperative, and gait not tested.  ASSESSMENT/PLAN Ms. ARSEMA TUSING is a 71 y.o. female with history of ESRD on IHD, NSTEMI, CAD, HTN, HLD, right AKA, stroke and T2DM presenting with left sided weakness, left facial droop and slurred speech.  She was brought to the ED.  TNK was not given as she presented outside the window.  CT angiography revealed LVO in the proximal right ICA with perfusion study showing core infarct of 29cc and penumbra of 168cc in right MCA and PCA territories. Mechanical thrombectomy was performed with TICI 3 flow achieved.  Receiving hemodialysis per nephrology. Extubated 1/7 to [facility]. Remains extubated and doing well from a respiratory standpoint.  Cortrak in place for TF and medications. Will transfer out of the ICU today.  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 Bilateral basal ganglia and left cerbellar chronic infarcts, chronic C1-C2 subluxation. ASPECTS 10.    CTA head & neck emergent LVO at proximal right ICA, isolated right M1 segment and fetal type right PCA, left vertebral artery occlusion, stenotic right vertebral artery, high grade mid basilar stenosis CT perfusion 29/197 Status post IR with TICI3 at right ICA.  MRI  acute infarcts in right MCA and PCA territory, punctate foci of restricted diffusion in bilateral forntal and parietal lobes as well as left occipital lobe MRA  resolution of right ICA occlusion  2D Echo EF 25-30%, no LV thrombus LDL 74 HgbA1c 5.5 UDS negative VTE prophylaxis -Heparin subcu aspirin 81 mg daily and  Brilinta (ticagrelor) 90 mg bid prior to admission, now on eliquis and ASA. Recommend to check TTE in 3 months, if EF > 30-35%, eliquis can be switched back to brilinta.  Therapy recommendations:  SNF Disposition:  pending  Respiratory failure Extubated 1/7 to [facility] Management per CCM Currently tolerating well Consider tracheostomy if reintubated  Elevated WBC Fever WBC 15.6 > 19.1->21.4->28.3->37.3->45.2-> 22.3-> 22.5 T-max 100.1-> afebrile Possible aspiration pneumonia.  Cefepime -> vancomycin and Zosyn->zosyn managed by CCM.  Cardiomyopathy 07/2021 EF 20 to 25% This admission EF 25 to 30% Home meds including Imdur and Coreg Currently not on home meds due to hypotension Management per CCM Was on heparin IV for 48 hours. now on eliquis and ASA. Recommend to check TTE in 3 months, if EF > 30-35%, eliquis can be switched back to brilinta.  Hx of hypertension Hypotension Home meds:  Coreg 3.125 mg BID BP on the low end Off Levophed Long-term BP goal normotensive Continue midodrine 10 TID  Hyperlipidemia Home meds:  atorvastatin 80 mg daily, resumed in hospital LDL 74, goal < 70 Zetia 10 mg daily and atorvastatin 80mg  Continue statin at discharge  Diabetes Hemoglobin A1c 5.5, uncontrolled Hyperglycemia On insulin SSI CBG monitoring Close PCP follow-up for better DM control Diabetic consult.  NSTEMI 09/27/21 Chest pain with significantly elevated troponin Cardiology on board on heparin drip for 48 hours and now on eliquis. Continue ASA.  Continue titrating down midodrine Add metoprolol when bp is stable  Other Stroke Risk Factors Advanced Age >/= 47  Former cigarette smoker Hx stroke Coronary artery disease PVD  Other Active Problems ESRD on IHD - Appreciate nephrology recommendations  Right LE AKA Initial CT showed C1-C2 subluxation with anterior spinal cord compression, similar as prior.  MRI C-spine showed diffuse cervical spine spinal canal stenosis with  cord flattening but no cord signal abnormality. Anemia due to ESRD, hemoglobin 8.5 > 7.9 > 7.5->7.6->8.3-> 7.9->7.7  Hospital day # 16  Patient seen and examined by NP/APP with MD. MD to update note as needed.   [place] , MSN, AGACNP-BC [facility] See Amion for schedule and pager information 09/30/2021 11:54 AM  ATTENDING NOTE: I reviewed above note and agree with the assessment and plan. Pt was seen and examined.   RN at bedside.  Patient awake alert but still lethargic, follow simple commands.  Still has left hemiparesis but improving slowly.  Neurologically stable, unchanged.  On dysphagia 1 and thin liquid.  Heparin IV has been more than 40 hours and tolerating well.  Discussed with cardiology Dr. Ali Lowe, will switch to [place] eliquis and continue ASA. Once EF > 30-35%, can consider switch eliquis back to  brilinta.   For detailed assessment and plan, please refer to above as I have made changes wherever appropriate.   Rosalin Hawking, MD PhD Stroke Neurology 09/30/2021 3:49 PM  This patient is critically ill due to large right MCA and PCA stroke, respiratory failure, ESRD on hemodialysis, CHF with low EF, non-STEMI and at significant risk of neurological worsening, death form heart failure, cardiac arrest, respiratory failure, sepsis, recurrent stroke and hemorrhagic transformation. This patient's care requires constant monitoring of vital signs, hemodynamics, respiratory and cardiac monitoring, review of multiple databases, neurological assessment, discussion with family, other specialists and medical decision making of high complexity. I spent 35 minutes of neurocritical care time in the care of this patient.  I discussed with CCM Dr. Lynetta Mare and intervention Dr. Tana Coast.    To contact Stroke Continuity provider, please refer to [email or web address] After hours, contact General Neurology

## 2021-09-30 NOTE — Progress Notes (Signed)
Physical Therapy Treatment Patient Details Name: Jenna Brown MRN: [id number] DOB: 20-Mar-1951 Today's Date: 09/30/2021   History of Present Illness 71 yo female [facility] on 12/27 with L hemiparesis, slurred speech. 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 Of note,"Patient has atlantodental instability with cord compression and recommend minimizing cervical manipulation especially neck extension; may benefit from neurosurgical evaluation". ETT 12/27-1/7. NSTEMI 1/9. PMH includes recent hospitalization with covid and possible STEMI, end-stage renal disease on hemodialysis, NSTEMI, CAD, HTN, HLD, right AKA 2013, history of TIA/stroke, and type 2 diabetes mellitus.    PT Comments    Seen in conjunction with OT for OOB mobility with use of maxisky. Patient able to perform bed mobility with maxA. Able to lean L/R for lift pad placement underneath her but complains of pain at flexiseal. Transferred patient to recliner with maxisky with good tolerance. Patient continues to be limited by weakness, cognition, and activity tolerance. Continue to recommend SNF for ongoing Physical Therapy.       Recommendations for follow up therapy are one component of a multi-disciplinary discharge planning process, led by the attending physician.  Recommendations may be updated based on patient status, additional functional criteria and insurance authorization.  Follow Up Recommendations  Skilled nursing-short term rehab (<3 hours/day)     Assistance Recommended at Discharge Frequent or constant Supervision/Assistance  Patient can return home with the following Two people to help with walking and/or transfers;Two people to help with bathing/dressing/bathroom;Assistance with cooking/housework;Assistance with feeding;Assist for transportation;Direct supervision/assist for financial management;Direct  supervision/assist for medications management;Help with stairs or ramp for entrance   Equipment Recommendations  Hospital bed    Recommendations for Other Services       Precautions / Restrictions Precautions Precautions: Fall Precaution Comments: chronic R BKA Restrictions Weight Bearing Restrictions: No     Mobility  Bed Mobility Overal bed mobility: Needs Assistance Bed Mobility: Sidelying to Sit;Rolling Rolling: Max assist Sidelying to sit: Max assist            Transfers Overall transfer level: Needs assistance   Transfers: Bed to chair/wheelchair/BSC             General transfer comment: use of maxisky for OOB to chair this date. Patient with good tolerance Transfer via Lift Equipment: Maxisky  Ambulation/Gait                   Stairs             Wheelchair Mobility    Modified Rankin (Stroke Patients Only) Modified Rankin (Stroke Patients Only) Pre-Morbid Rankin Score: Moderately severe disability Modified Rankin: Severe disability     Balance Overall balance assessment: Needs assistance Sitting-balance support: No upper extremity supported Sitting balance-Leahy Scale: Poor Sitting balance - Comments: L and R lateral lean while sitting EOB to place lift pad with maxA to maintain balance Postural control: Posterior lean;Right lateral lean                                  Cognition Arousal/Alertness: Awake/alert Behavior During Therapy: Restless Overall Cognitive Status: No family/caregiver present to determine baseline cognitive functioning Area of Impairment: Following commands;Orientation;Memory;Safety/judgement;Awareness;Problem solving;Attention                   Current Attention Level: Sustained Memory: Decreased short-term memory Following Commands: Follows one step commands inconsistently;Follows one step  commands with increased time Safety/Judgement: Decreased awareness of safety;Decreased  awareness of deficits Awareness: Intellectual Problem Solving: Slow processing;Decreased initiation;Difficulty sequencing;Requires verbal cues;Requires tactile cues          Exercises      General Comments        Pertinent Vitals/Pain Pain Assessment: Faces Faces Pain Scale: Hurts little more Pain Location: buttocks/rectal tube Pain Descriptors / Indicators: Sore;Discomfort Pain Intervention(s): Monitored during session;Repositioned    Home Living                          Prior Function            PT Goals (current goals can now be found in the care plan section) Acute Rehab PT Goals Patient Stated Goal: did not state PT Goal Formulation: Patient unable to participate in goal setting Time For Goal Achievement: 10/05/21 Potential to Achieve Goals: Fair Progress towards PT goals: Progressing toward goals    Frequency    Min 3X/week      PT Plan Current plan remains appropriate    Co-evaluation PT/OT/SLP Co-Evaluation/Treatment: Yes Reason for Co-Treatment: For patient/therapist safety;To address functional/ADL transfers PT goals addressed during session: Mobility/safety with mobility;Balance OT goals addressed during session: ADL's and self-care      AM-PAC PT "6 Clicks" Mobility   Outcome Measure  Help needed turning from your back to your side while in a flat bed without using bedrails?: A Lot Help needed moving from lying on your back to sitting on the side of a flat bed without using bedrails?: A Lot Help needed moving to and from a bed to a chair (including a wheelchair)?: Total Help needed standing up from a chair using your arms (e.g., wheelchair or bedside chair)?: Total Help needed to walk in hospital room?: Total Help needed climbing 3-5 steps with a railing? : Total 6 Click Score: 8    End of Session   Activity Tolerance: Patient tolerated treatment well Patient left: in chair;with call bell/phone within reach;with chair alarm  set Nurse Communication: Mobility status PT Visit Diagnosis: Hemiplegia and hemiparesis Hemiplegia - Right/Left: Left Hemiplegia - dominant/non-dominant: Non-dominant Hemiplegia - caused by: Cerebral infarction     Time: 1539-1611 PT Time Calculation (min) (ACUTE ONLY): 32 min  Charges:  $Therapeutic Activity: 8-22 mins                     Jenna Brown A. Gilford Rile PT, DPT Acute Rehabilitation Services Pager [phone] Office [phone]    Linna Hoff 09/30/2021, 5:23 PM

## 2021-09-30 NOTE — Assessment & Plan Note (Addendum)
-   Presented with left-sided weakness, left facial droop and slurred speech.  TN K not given as presented outside the window -CTA head and neck showed LVO in the proximal right ICA, isolated right M1 segment, P2 high right PCA, left vertebral artery occlusion, stenotic right vertebral artery, high-grade mid basilar stenosis.  CT perfusion 29/197. -MRI showed acute infarcts in the right MCA and PCA territory, punctate foci of restricted diffusion in bilateral frontal thyroid lobes as well as left occipital lobe -Status post mechanical thrombectomy with TICI 3 at right ICA -MRA showed resolution of right ICA occlusion -2D echo showed EF 25- 30%, no LV thrombus.  LDL 74.  A1c 5.5 -PTA patient was on aspirin 81 mg daily and Brilinta 90 mg twice daily, was placed on heparin IV, then transitioned to aspirin and apixaban -Overall poor prognosis, continues to have left-sided weakness, dysarthria, dysphagia, unable to advance diet.  Palliative medicine was consulted and goals of care were addressed, now full comfort care.

## 2021-09-30 NOTE — Progress Notes (Signed)
Progress Note   Patient: Jenna Brown JQB:[id number] DOB: Oct 24, 1950 DOA: 09/14/2021     16 DOS: the patient was seen and examined on 09/30/2021   Brief hospital course: 71 year old woman who presented to [facility] 12/27 with left-sided weakness, left facial droop and slurred speech, found to have acute right ICA stroke, s/p mechanical thrombectomy 12/28. PMHx significant for HTN, HLD, T2DM, CAD, PVD (s/p R AKA), ESRD on HD MWF. Of note, recent admission for COVID-19 infection (treated with monotherapy as an outpatient); she then developed chest pain with concern for possible NSTEMI.  TRH requested to assume care on 1/12, plan to transfer out of ICU.  -stable from neuro standpoint.  Still has dysphagia, on cortrack for tube feeds -Extubated on 1/7, O2 sats 95% on 2 L  Assessment and Plan * Acute ischemic stroke ([place])- (present on admission) - Presented with left-sided weakness, left facial droop and slurred speech.  TN K not given as presented outside the window -CTA head and neck showed LVO in the proximal right ICA, isolated right M1 segment, P2 high right PCA, left vertebral artery occlusion, stenotic right vertebral artery, high-grade mid basilar stenosis.  CT perfusion 29/197. -MRI showed acute infarcts in the right MCA and PCA territory, punctate foci of restricted diffusion in bilateral frontal thyroid lobes as well as left occipital lobe -Status post mechanical thrombectomy with TICI 3 at right ICA -MRA showed resolution of right ICA occlusion -2D echo showed EF 25- 30%, no LV thrombus.  LDL 74.  A1c 5.5 -PTA patient was on aspirin 81 mg daily Brilinta 90 mg twice daily, was placed on heparin IV, now transitioned to aspirin 81 mg daily, apixaban 5 mg twice daily -Ongoing issues with dysphagia, on core track, PT recommended SNF   Dysphagia - Serial SLP evaluation, currently has core track and on puree/dysphagia 1 diet -Advance as tolerated  Klebsiella pneumonia ([place])- (present on  admission) - Initially was placed on ceftriaxone, then transition to IV vancomycin and Zosyn - Completed antibiotics (Zosyn) after 7-day course  Acute respiratory failure with hypoxia ([place])- (present on admission) - In the setting of Klebsiella pneumonia, COVID-19, small left lung pleural effusion  -Completed antibiotics (Zosyn) after 7-day course on 1/11 -Continue aspiration precautions  COVID-19 virus infection- (present on admission) - Asymptomatic  NSTEMI (non-ST elevated myocardial infarction) ([place])- (present on admission) - Severe ischemic cardiomyopathy, NSTEMI in the setting of recent ischemic CVA.  Troponins peaked at 13,028 -2D echo showed EF of 25 to 30%, global hypokinesis, grade 1 DD -Cardiology following, treated with IV heparin for 48 hours, now transitioned to aspirin 81 mg daily with eliquis. -Continue statin, Zetia  ESRD (end stage renal disease) on dialysis ([place]) - On hemodialysis, MWF, nephrology following -Has anemia of chronic disease, and last increase to 132mcg weekly  Insulin-requiring or dependent type II diabetes mellitus ([place]) - on tube feed coverage -Not well controlled with hyperglycemia, continue sliding scale insulin, increase Semglee to 23 units BID, continue NovoLog 4 units every 4 hours  -Hemoglobin A1c 5.5 on 09/15/2021 Recent Labs    09/29/21 1956 09/29/21 2340 09/30/21 0348 09/30/21 0723 09/30/21 1002 09/30/21 1117  GLUCAP 190* 222* 190* 206* 265* 267*    Essential hypertension- (present on admission) - Soft, on midodrine.  Beta-blocker on hold  Pressure injury of skin- (present on admission) Pressure Injury 09/17/21 Coccyx Medial Deep Tissue Pressure Injury - Purple or maroon localized area of discolored intact skin or blood-filled blister due to damage of underlying soft tissue from  pressure and/or shear. (Active)  09/17/21 1610  Location: Coccyx  Location Orientation: Medial  Staging: Deep Tissue Pressure Injury - Purple or maroon  localized area of discolored intact skin or blood-filled blister due to damage of underlying soft tissue from pressure and/or shear.  Wound Description (Comments):   Present on Admission:      Pressure Injury 09/28/21 Thigh Posterior;Upper;Left Stage 2 -  Partial thickness loss of dermis presenting as a shallow open injury with a red, pink wound bed without slough. upper/inner-thigh (Active)  09/28/21 0800  Location: Thigh  Location Orientation: Posterior;Upper;Left  Staging: Stage 2 -  Partial thickness loss of dermis presenting as a shallow open injury with a red, pink wound bed without slough.  Wound Description (Comments): upper/inner-thigh  Present on Admission: No       Subjective: Still has left-sided weakness, dysphagia, otherwise following commands  Objective BP 135/86    Pulse (!) 110    Temp 98.6 F (37 C) (Oral)    Resp 16    Ht 5\' 4"  (1.626 m)    Wt 69.6 kg    SpO2 96%    BMI 26.34 kg/m    Physical Exam General: Alert and oriented, NAD, following commands, core track Cardiovascular: S1 S2 clear, RRR. No pedal edema b/l Respiratory: CTAB, no wheezing, rales or rhonchi Gastrointestinal: Soft, nontender, nondistended, NBS Ext: right AKA Neuro: right AKA, RUE 5/5.  LUE 2/5, LDL 4/5    Data Reviewed:  CBC Latest Ref Rng & Units 09/30/2021 09/29/2021 09/28/2021  WBC 4.0 - 10.5 K/uL 22.5(H) 22.3(H) 45.2(H)  Hemoglobin 12.0 - 15.0 g/dL 7.7(L) 7.9(L) 8.3(L)  Hematocrit 36.0 - 46.0 % 25.6(L) 26.4(L) 26.8(L)  Platelets 150 - 400 K/uL 290 265 327      BMP Latest Ref Rng & Units 09/30/2021 09/29/2021 09/28/2021  Glucose 70 - 99 mg/dL 209(H) 250(H) 154(H)  BUN 8 - 23 mg/dL 68(H) 94(H) 70(H)  Creatinine 0.44 - 1.00 mg/dL 4.89(H) 6.45(H) 5.34(H)  Sodium 135 - 145 mmol/L 133(L) 136 134(L)  Potassium 3.5 - 5.1 mmol/L 4.5 4.9 3.8  Chloride 98 - 111 mmol/L 93(L) 96(L) 95(L)  CO2 22 - 32 mmol/L 24 21(L) 23  Calcium 8.9 - 10.3 mg/dL 8.5(L) 8.4(L) 8.5(L)    Family Communication:  Discussed with the patient  Disposition: Status is: Inpatient  Remains inpatient appropriate because: Not medically ready for discharge, still on tube feeds  DVT prophylaxis: Apixaban    Time spent: 50 minutes  Author: Estill Cotta, MD 09/30/2021 1:39 PM  For on call review [email or web address].

## 2021-09-30 NOTE — Consult Note (Signed)
[facility] Nurse Consult Note: Reason for Consult:DTPI in evolution to coccygeal area. Bowel management system in place, but fecal soiling around the device continues Wound type: pressure, moisture Pressure Injury POA: No Measurement: 5cm x 10cm with 0.2cm tissue depth observed in 60% of wound today, 40% nonviable tissue remains Wound bed:as described above Drainage (amount, consistency, odor) small serous Periwound:intact Dressing procedure/placement/frequency: Patient is on a mattress replacement and can turn from side to side, but needs to be reminded/assisted.  Fecal incontinence continues to be an issue in spite of indwelling device.  Topical care guidance has been provided using a xeroform (antimicrobial, nonadherent) beneath a silicone foam dressing.  A pressure redistribution heel boot is provided for the left foot.  [facility] nursing team will follow, and will remain available to this patient, the nursing and medical teams.   Thanks, Maudie Flakes, MSN, RN, [place], Arther Abbott  Pager# [phone]

## 2021-10-01 ENCOUNTER — Inpatient Hospital Stay (HOSPITAL_COMMUNITY): Payer: Medicare Other

## 2021-10-01 DIAGNOSIS — L03116 Cellulitis of left lower limb: Secondary | ICD-10-CM

## 2021-10-01 DIAGNOSIS — R1312 Dysphagia, oropharyngeal phase: Secondary | ICD-10-CM

## 2021-10-01 DIAGNOSIS — J9601 Acute respiratory failure with hypoxia: Secondary | ICD-10-CM | POA: Diagnosis not present

## 2021-10-01 DIAGNOSIS — E1165 Type 2 diabetes mellitus with hyperglycemia: Secondary | ICD-10-CM

## 2021-10-01 LAB — BASIC METABOLIC PANEL
Anion gap: 21 — ABNORMAL HIGH (ref 5–15)
BUN: 116 mg/dL — ABNORMAL HIGH (ref 8–23)
CO2: 18 mmol/L — ABNORMAL LOW (ref 22–32)
Calcium: 8.9 mg/dL (ref 8.9–10.3)
Chloride: 94 mmol/L — ABNORMAL LOW (ref 98–111)
Creatinine, Ser: 6.36 mg/dL — ABNORMAL HIGH (ref 0.44–1.00)
GFR, Estimated: 7 mL/min — ABNORMAL LOW (ref >60)
Glucose, Bld: 239 mg/dL — ABNORMAL HIGH (ref 70–99)
Potassium: 5.7 mmol/L — ABNORMAL HIGH (ref 3.5–5.1)
Sodium: 133 mmol/L — ABNORMAL LOW (ref 135–145)

## 2021-10-01 LAB — GLUCOSE, CAPILLARY
Glucose-Capillary: 111 mg/dL — ABNORMAL HIGH (ref 70–99)
Glucose-Capillary: 114 mg/dL — ABNORMAL HIGH (ref 70–99)
Glucose-Capillary: 172 mg/dL — ABNORMAL HIGH (ref 70–99)
Glucose-Capillary: 188 mg/dL — ABNORMAL HIGH (ref 70–99)
Glucose-Capillary: 230 mg/dL — ABNORMAL HIGH (ref 70–99)
Glucose-Capillary: 232 mg/dL — ABNORMAL HIGH (ref 70–99)
Glucose-Capillary: 249 mg/dL — ABNORMAL HIGH (ref 70–99)

## 2021-10-01 LAB — CBC
HCT: 29.5 % — ABNORMAL LOW (ref 36.0–46.0)
Hemoglobin: 8.3 g/dL — ABNORMAL LOW (ref 12.0–15.0)
MCH: 29.6 pg (ref 26.0–34.0)
MCHC: 28.1 g/dL — ABNORMAL LOW (ref 30.0–36.0)
MCV: 105.4 fL — ABNORMAL HIGH (ref 80.0–100.0)
Platelets: 290 10*3/uL (ref 150–400)
RBC: 2.8 MIL/uL — ABNORMAL LOW (ref 3.87–5.11)
RDW: 19.4 % — ABNORMAL HIGH (ref 11.5–15.5)
WBC: 19.6 10*3/uL — ABNORMAL HIGH (ref 4.0–10.5)
nRBC: 0.2 % (ref 0.0–0.2)

## 2021-10-01 LAB — PROCALCITONIN: Procalcitonin: 3.03 ng/mL

## 2021-10-01 MED ORDER — GUAIFENESIN 100 MG/5ML PO LIQD: 5.0000 mL | ORAL | Status: DC | PRN

## 2021-10-01 MED ORDER — ASPIRIN 81 MG PO CHEW
81.0000 mg | CHEWABLE_TABLET | Freq: Every day | ORAL | Status: DC
  Administered 2021-10-01: 81 mg
  Filled 2021-10-01 (×2): qty 1

## 2021-10-01 MED ORDER — MIDODRINE HCL 5 MG PO TABS
5.0000 mg | ORAL_TABLET | Freq: Three times a day (TID) | ORAL | Status: DC
  Administered 2021-10-02 – 2021-10-03 (×3): 5 mg
  Filled 2021-10-01 (×4): qty 1

## 2021-10-01 MED ORDER — CARVEDILOL 3.125 MG PO TABS
3.1250 mg | ORAL_TABLET | Freq: Two times a day (BID) | ORAL | Status: DC
  Administered 2021-10-01 – 2021-10-03 (×4): 3.125 mg
  Filled 2021-10-01 (×4): qty 1

## 2021-10-01 MED ORDER — PROSOURCE TF PO LIQD
90.0000 mL | Freq: Two times a day (BID) | ORAL | Status: DC
  Administered 2021-10-01 – 2021-10-02 (×3): 90 mL
  Filled 2021-10-01 (×4): qty 90

## 2021-10-01 MED ORDER — INSULIN ASPART 100 UNIT/ML IJ SOLN
6.0000 [IU] | INTRAMUSCULAR | Status: DC
  Administered 2021-10-01 – 2021-10-03 (×11): 6 [IU] via SUBCUTANEOUS

## 2021-10-01 MED ORDER — OSMOLITE 1.5 CAL PO LIQD
1000.0000 mL | ORAL | Status: DC
  Administered 2021-10-01: 1000 mL

## 2021-10-01 MED ORDER — APIXABAN 5 MG PO TABS
5.0000 mg | ORAL_TABLET | Freq: Two times a day (BID) | ORAL | Status: DC
  Filled 2021-10-01: qty 1

## 2021-10-01 MED ORDER — LORAZEPAM 2 MG/ML IJ SOLN
1.0000 mg | Freq: Once | INTRAMUSCULAR | Status: AC
Start: 2021-10-01 — End: 2021-10-01
  Administered 2021-10-01: 1 mg via INTRAVENOUS
  Filled 2021-10-01: qty 1

## 2021-10-01 MED ORDER — APIXABAN 5 MG PO TABS
5.0000 mg | ORAL_TABLET | Freq: Two times a day (BID) | ORAL | Status: DC
  Administered 2021-10-01 (×2): 5 mg
  Filled 2021-10-01: qty 1

## 2021-10-01 MED ORDER — MIDODRINE HCL 5 MG PO TABS
10.0000 mg | ORAL_TABLET | Freq: Three times a day (TID) | ORAL | Status: DC
  Administered 2021-10-01: 10 mg
  Filled 2021-10-01: qty 2

## 2021-10-01 MED ORDER — SEVELAMER CARBONATE 0.8 G PO PACK
0.8000 g | PACK | Freq: Three times a day (TID) | ORAL | Status: DC
  Administered 2021-10-01 – 2021-10-03 (×5): 0.8 g
  Filled 2021-10-01 (×8): qty 1

## 2021-10-01 NOTE — TOC Progression Note (Signed)
Transition of Care [facility]) - Progression Note    Patient Details  Name: Jenna Brown MRN: [id number] Date of Birth: March 05, 1951  Transition of Care [facility]) CM/SW [address], LCSW Phone Number: 10/01/2021, 3:20 PM  Clinical Narrative:    CSW received call from patient's cousin, Ruby. She is also in agreement with SNF for rehab but states after that patient will return home with them as they always "take care of family". CSW will follow for medical readiness and locate a SNF that will transport to [facility].    Expected Discharge Plan: Skilled Nursing Facility Barriers to Discharge: Continued Medical Work up, SNF Pending bed offer  Expected Discharge Plan and Services Expected Discharge Plan: [place] In-house Referral: Clinical Social Work   Post Acute Care Choice: [place] Living arrangements for the past 2 months: [place] Determinants of Health (SDOH) Interventions    Readmission Risk Interventions Readmission Risk Prevention Plan 09/27/2021 08/19/2021  Transportation Screening Complete Complete  PCP or Specialist Appt within 3-5 Days - Complete  HRI or [place] - Complete  Social Work Consult for [place] Planning/Counseling - Complete  Palliative Care Screening - Not Applicable  Medication Review Press photographer) Complete Complete  PCP or Specialist appointment within 3-5 days of discharge Complete -  [place] or Home Care Consult Complete -  SW Recovery Care/Counseling Consult Complete -  Palliative Care Screening Complete -  Skilled Nursing Facility Complete -  Some recent data might be hidden

## 2021-10-01 NOTE — Progress Notes (Addendum)
STROKE TEAM PROGRESS NOTE   INTERVAL HISTORY No family at the bedside. She is awake alert and oriented and following commands. Remains extubated. Hemodynamically stable and neurological exam remains stable.  Plan to transfer out of the ICU when a bed is available.    Vitals:   10/01/21 0600 10/01/21 0700 10/01/21 0800 10/01/21 0813  BP: 130/78 (!) 146/83 (!) 147/81   Pulse: (!) 111 (!) 109 (!) 107   Resp: (!) 22 (!) 25 (!) 22   Temp:   98.8 F (37.1 C)   TempSrc:   Axillary   SpO2: 100% 100% 100% 100%  Weight: 71.8 kg     Height:       CBC:  Recent Labs  Lab 09/30/21 0418 10/01/21 0405  WBC 22.5* 19.6*  HGB 7.7* 8.3*  HCT 25.6* 29.5*  MCV 98.8 105.4*  PLT 290 154    Basic Metabolic Panel:  Recent Labs  Lab 09/28/21 0927 09/28/21 1108 09/29/21 0446 09/30/21 0418 10/01/21 0405  NA 134*  --    < > 133* 133*  K 3.8  --    < > 4.5 5.7*  CL 95*  --    < > 93* 94*  CO2 23  --    < > 24 18*  GLUCOSE 154*  --    < > 209* 239*  BUN 70*  --    < > 68* 116*  CREATININE 5.34*  --    < > 4.89* 6.36*  CALCIUM 8.5*  --    < > 8.5* 8.9  MG 2.4 2.5*  --   --   --   PHOS 4.5 4.4  --   --   --    < > = values in this interval not displayed.    Lipid Panel:  No results for input(s): CHOL, TRIG, HDL, CHOLHDL, VLDL, LDLCALC in the last 168 hours.  HgbA1c:  No results for input(s): HGBA1C in the last 168 hours.  Urine Drug Screen:  No results for input(s): LABOPIA, COCAINSCRNUR, LABBENZ, AMPHETMU, THCU, LABBARB in the last 168 hours.   Alcohol Level  No results for input(s): ETH in the last 168 hours.  IMAGING past 24 hours No results found.  PHYSICAL EXAM  Temp:  [98.5 F (36.9 C)-99.4 F (37.4 C)] 98.8 F (37.1 C) (01/13 0800) Pulse Rate:  [102-121] 107 (01/13 0800) Resp:  [16-36] 22 (01/13 0800) BP: (101-147)/(66-110) 147/81 (01/13 0800) SpO2:  [92 %-100 %] 100 % (01/13 0813) Weight:  [71.8 kg] 71.8 kg (01/13 0600)  General - Alert, well-developed,  well-nourished female in no acute distress  Cardiovascular - Regular rate and rhythm.  Neurological: Awake alert and interactive. Oriented to person and place.  Eyes are open and follows simple commands. PERRL, tracking bilaterally, no significant gaze palsy but still has left hemianopia, not blinking to visual threat on the left.  Will move all extremities in response to commands with 5/5 strength in RUE and RLE (right AKA chronic), 3/5 strength in LUE, and LLE 3/5 strength. Sensation and coordination not cooperative, and gait not tested.  ASSESSMENT/PLAN Jenna Brown is a 71 y.o. female with history of ESRD on IHD, NSTEMI, CAD, HTN, HLD, right AKA, stroke and T2DM presenting with left sided weakness, left facial droop and slurred speech.  She was brought to the ED.  TNK was not given as she presented outside the window.  CT angiography revealed LVO in the proximal right ICA with perfusion study showing core infarct  of 29cc and penumbra of 168cc in right MCA and PCA territories. Mechanical thrombectomy was performed with TICI 3 flow achieved.  Receiving hemodialysis per nephrology. Extubated 1/7 to [facility]. Remains extubated and doing well from a respiratory standpoint.  Cortrak in place for TF and medications. Diet upgraded to dysphagia 1 (pureed). Will transfer out of the ICU today.  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 Bilateral basal ganglia and left cerbellar chronic infarcts, chronic C1-C2 subluxation. ASPECTS 10.    CTA head & neck emergent LVO at proximal right ICA, isolated right M1 segment and fetal type right PCA, left vertebral artery occlusion, stenotic right vertebral artery, high grade mid basilar stenosis CT perfusion 29/197 Status post IR with TICI3 at right ICA.  MRI  acute infarcts in right MCA and PCA territory, punctate foci of restricted diffusion in  bilateral forntal and parietal lobes as well as left occipital lobe MRA  resolution of right ICA occlusion  2D Echo EF 25-30%, no LV thrombus LDL 74 HgbA1c 5.5 UDS negative VTE prophylaxis -Heparin subcu aspirin 81 mg daily and Brilinta (ticagrelor) 90 mg bid prior to admission, now on eliquis and ASA. Recommend to check TTE in 3 months, if EF > 30-35%, eliquis can be switched back to brilinta.  Therapy recommendations:  SNF Disposition:  pending  Respiratory failure Extubated 1/7 to [facility] Management per CCM Currently tolerating well Consider tracheostomy if reintubated  Elevated WBC Fever WBC 15.6 > 19.1->21.4->28.3->37.3->45.2-> 22.3-> 22.5-> 19.6 T-max 100.1-> afebrile Possible aspiration pneumonia.  Cefepime -> vancomycin and Zosyn->zosyn managed by CCM.  Cardiomyopathy 07/2021 EF 20 to 25% This admission EF 25 to 30% Home meds including Imdur and Coreg Currently not on home meds due to hypotension Management per CCM Was on heparin IV for 48 hours. now on eliquis and ASA. Recommend to check TTE in 3 months, if EF > 30-35%, eliquis can be switched back to brilinta.  Hx of hypertension Hypotension Home meds:  Coreg 3.125 mg BID BP on the low end Off Levophed Long-term BP goal normotensive Continue midodrine 10 TID  Hyperlipidemia Home meds:  atorvastatin 80 mg daily, resumed in hospital LDL 74, goal < 70 Zetia 10 mg daily and atorvastatin 80mg  Continue statin at discharge  Diabetes Hemoglobin A1c 5.5, uncontrolled Hyperglycemia On insulin SSI CBG monitoring Close PCP follow-up for better DM control Diabetic consult.  NSTEMI 09/27/21 Chest pain with significantly elevated troponin Cardiology on board on heparin drip for 48 hours and now on eliquis. switch to DOAC eliquis and continue ASA.  Once EF > 30-35%, can consider switch eliquis back to brilinta per cardiology Continue ASA.  Continue titrating down midodrine Add metoprolol when bp is stable  Other  Stroke Risk Factors Advanced Age >/= 58  Former cigarette smoker Hx stroke Coronary artery disease PVD  Other Active Problems ESRD on IHD - Appreciate nephrology recommendations  Right LE AKA Initial CT showed C1-C2 subluxation with anterior spinal cord compression, similar as prior.  MRI C-spine showed diffuse cervical spine spinal canal stenosis with cord flattening but no cord signal abnormality. Anemia due to ESRD, hemoglobin 8.5 > 7.9 > 7.5->7.6->8.3-> 7.9->7.7->8.3  Hospital day # 17  Patient seen and examined by NP/APP with MD. MD to update note as needed.   Janine Ores, DNP, FNP-BC [facility] Pager: [phone]  ATTENDING NOTE: I reviewed above note and agree  with the assessment and plan. Pt was seen and examined.   No family at bedside.  Patient neurologically stable, unchanged from yesterday.  Moving right arm spontaneously with normal strength, left lower extremity AKA with baseline strength.  Left upper and lower extremity 3-/5 and 3+/5 respectively.  No fever, leukocytosis improving, hemoglobin stable.  BP stable on midodrine.  Currently on Eliquis and aspirin.  Repeat TTE in 3 months, if EF more than 30 to 35%, Eliquis can be switched back to Brilinta.  Passing swallow on dysphagia 1 and thin liquid.  PT/OT recommend SNF.  For detailed assessment and plan, please refer to above as I have made changes wherever appropriate.   Neurology will sign off. Please call with questions. Pt will follow up with stroke clinic NP at [facility] in about 4 weeks. Thanks for the consult.   Rosalin Hawking, MD PhD Stroke Neurology 10/01/2021 5:59 PM    To contact Stroke Continuity provider, please refer to [email or web address] After hours, contact General Neurology

## 2021-10-01 NOTE — Progress Notes (Signed)
Progress Note  Patient Name: Jenna Brown Date of Encounter: 10/01/2021  [facility] Cardiologist: Skeet Latch, MD   Subjective   No acute events.  Patient is somnolent and unable to answer my questions.  Patient seen in dialysis unit.  Inpatient Medications    Scheduled Meds:  apixaban  5 mg Per Tube BID   aspirin  81 mg Per Tube Daily   atorvastatin  80 mg Per Tube Daily   carvedilol  3.125 mg Per Tube BID WC   chlorhexidine gluconate (MEDLINE KIT)  15 mL Mouth Rinse BID   Chlorhexidine Gluconate Cloth  6 each Topical Q0600   darbepoetin (ARANESP) injection - NON-DIALYSIS  100 mcg Subcutaneous Q Fri-1800   doxercalciferol  2.5 mcg Intravenous Q M,W,F-HD   ezetimibe  10 mg Per Tube Daily   feeding supplement (PROSource TF)  90 mL Per Tube BID   fiber  1 packet Per Tube BID   insulin aspart  0-15 Units Subcutaneous Q4H   insulin aspart  6 Units Subcutaneous Q4H   insulin glargine-yfgn  23 Units Subcutaneous BID   ipratropium-albuterol  3 mL Nebulization BID   mouth rinse  15 mL Mouth Rinse 10 times per day   midodrine  5 mg Per Tube TID WC   nutrition supplement (JUVEN)  1 packet Per Tube BID BM   pantoprazole sodium  40 mg Per Tube QHS   sevelamer carbonate  0.8 g Per Tube TID WC   Continuous Infusions:  sodium chloride Stopped (09/21/21 1234)   sodium chloride     feeding supplement (OSMOLITE 1.5 CAL) 1,000 mL (10/01/21 1434)   PRN Meds: acetaminophen **OR** acetaminophen (TYLENOL) oral liquid 160 mg/5 mL **OR** acetaminophen, guaiFENesin, loperamide HCl, nitroGLYCERIN, ondansetron (ZOFRAN) IV   Vital Signs    Vitals:   10/01/21 1630 10/01/21 1700 10/01/21 1730 10/01/21 1800  BP: 122/70 115/78 124/69 102/68  Pulse:  100  (!) 117  Resp: (!) 31 (!) 30 (!) 31 (!) 27  Temp:      TempSrc:      SpO2:  100%    Weight:      Height:        Intake/Output Summary (Last 24 hours) at 10/01/2021 1803 Last data filed at 10/01/2021 1600 Gross per 24 hour   Intake 870 ml  Output 50 ml  Net 820 ml   Last 3 Weights 10/01/2021 10/01/2021 09/30/2021  Weight (lbs) 161 lb 13.1 oz 158 lb 4.6 oz 153 lb 7 oz  Weight (kg) 73.4 kg 71.8 kg 69.6 kg      Telemetry    Sinus tachycardia- Personally Reviewed  ECG    None performed- Personally Reviewed  Physical Exam   GEN: No acute distress, somnolent Neck: No JVD Cardiac: Tachycardic, no murmurs, rubs, or gallops.  Respiratory: Clear to auscultation bilaterally. GI: Soft, nontender, non-distended  MS: No edema; No deformity.  Status post right AKA Neuro: Not assessed Psych: Not assessed  Labs    High Sensitivity Troponin:   Recent Labs  Lab 09/27/21 2134 09/28/21 0001 09/28/21 0909 09/28/21 1108  TROPONINIHS 11,257* 10,661* 13,048* 10,988*     Chemistry Recent Labs  Lab 09/28/21 0825 09/28/21 0927 09/28/21 1108 09/29/21 0446 09/30/21 0418 10/01/21 0405  NA 134* 134*  --  136 133* 133*  K 2.3* 3.8  --  4.9 4.5 5.7*  CL 92* 95*  --  96* 93* 94*  CO2 27 23  --  21* 24 18*  GLUCOSE 100*  154*  --  250* 209* 239*  BUN 24* 70*  --  94* 68* 116*  CREATININE 2.25* 5.34*  --  6.45* 4.89* 6.36*  CALCIUM 8.6* 8.5*  --  8.4* 8.5* 8.9  MG 2.1 2.4 2.5*  --   --   --   PROT 7.3 6.9  --   --   --   --   ALBUMIN 3.0* 2.8*  --   --   --   --   AST 89* 78*  --   --   --   --   ALT 78* 69*  --   --   --   --   ALKPHOS 82 80  --   --   --   --   BILITOT 0.8 0.9  --   --   --   --   GFRNONAA 23* 8*  --  7* 9* 7*  ANIONGAP 15 16*  --  19* 16* 21*    Lipids No results for input(s): CHOL, TRIG, HDL, LABVLDL, LDLCALC, CHOLHDL in the last 168 hours.  Hematology Recent Labs  Lab 09/29/21 0446 09/30/21 0418 10/01/21 0405  WBC 22.3* 22.5* 19.6*  RBC 2.64* 2.59* 2.80*  HGB 7.9* 7.7* 8.3*  HCT 26.4* 25.6* 29.5*  MCV 100.0 98.8 105.4*  MCH 29.9 29.7 29.6  MCHC 29.9* 30.1 28.1*  RDW 18.2* 18.9* 19.4*  PLT 265 290 290   Thyroid No results for input(s): TSH, FREET4 in the last 168 hours.   BNPNo results for input(s): BNP, PROBNP in the last 168 hours.  DDimer No results for input(s): DDIMER in the last 168 hours.   Radiology    MR FOOT LEFT WO CONTRAST  Result Date: 10/01/2021 CLINICAL DATA:  Foot pain and swelling. EXAM: MRI OF THE LEFT FOOT WITHOUT CONTRAST TECHNIQUE: Multiplanar, multisequence MR imaging of the left foot was performed. No intravenous contrast was administered. COMPARISON:  None. FINDINGS: Bones/Joint/Cartilage No fracture or dislocation. Normal alignment. No joint effusion. T2 hyperintense signal in the tuft of the first distal phalanx which may reflect poor fat saturation versus mild marrow edema in the tuft of the first distal phalanx. Ligaments Collateral ligaments are intact.  Lisfranc ligament is intact. Muscles and Tendons Flexor, peroneal and extensor compartment tendons are intact. Muscles are normal. Soft tissue No fluid collection or hematoma. No soft tissue mass. Soft tissue edema along the dorsal aspect of the foot which may be reactive versus secondary to cellulitis. IMPRESSION: 1. T2 hyperintense signal in the tuft of the first distal phalanx which may reflect poor fat saturation versus mild marrow edema in the tuft of the first distal phalanx as can be seen with reactive edema versus osteomyelitis. Electronically Signed   By: Kathreen Devoid M.D.   On: 10/01/2021 16:31    Cardiac Studies   Echo 09/15/2021 1. Apical, mid-apical anterior, mid-apical anteroseptal, mid-apical  inferoseptal, mid-apical inferior, and mid-apical inferolateral akinesis.  Otherwise global hypokinesis. Left ventricular ejection fraction, by  estimation, is 25 to 30%. The left  ventricle has severely decreased function. The left ventricle demonstrates  regional wall motion abnormalities (see scoring diagram/findings for  description). The left ventricular internal cavity size was mildly  dilated. There is mild left ventricular  hypertrophy of the basal-septal segment. Left  ventricular diastolic  parameters are consistent with Grade I diastolic dysfunction (impaired  relaxation).   2. Right ventricular systolic function is normal. The right ventricular  size is normal.   3. Left atrial size was  mildly dilated.   4. The mitral valve is normal in structure. No evidence of mitral valve  regurgitation. No evidence of mitral stenosis. Moderate mitral annular  calcification.   5. The aortic valve is tricuspid. There is mild calcification of the  aortic valve. There is mild thickening of the aortic valve. Aortic valve  regurgitation is not visualized. No aortic stenosis is present.   6. The inferior vena cava is normal in size with <50% respiratory  variability, suggesting right atrial pressure of 8 mmHg.   Patient Profile     71 y.o. female with hx of known multivessel CAD (no target for PCI & not a CABG candidate), ESRD on HD, s/p R AKA, hypertension, DM, recent COVID-19 infection who was admitted 09/14/21 for acute ischemic/embolic stroke right MCA and PCA with R ICA occlusion status post mechanical thrombectomy on 09/14/21. Patient was extubated on 1/7. Seen by cardiology overnight for chest pain. Found to have elevated troponin    Assessment & Plan    NSTEMI: in the setting of recent ischemic stroke. hsT[place] at [address]. Treated with IV heparin gtt for total of 48hrs to ensure no complications with hemorrhagic conversion. Now transitioned to ASA 36m daily, along with Eliquis with high risk features for potential LV thrombus per discussion with MD.  This is unconventional as would normally treat with plavix and Eliquis but patient has plavix allergy.  Additionally, patient had been on chronic DAPT with ticagrelor and asa but DOAC with ticagrelor associated with excessive bleeding risk. -- statin, Zetia    Acute ischemic Stroke with occlusion of right internal carotid: s/p mechanical thrombectomy  -- management per Stroke team -- ASA transitioned to [id number]     ESRD on HD: nephrology following   HFrEF: suspect ICM in the setting of NSTEMI with WMA. EF of 25-30%. GMDT is limited in the setting of hypotension, on midodrine as well as ESRD Volume status being modulated by HD.  Still on midodrine.  Ideally, would start Coreg or Toprol.   PNA/Covid-19/Small L pleural effusion: s/p extubation 1/7, remains on O2 -- antibiotics per primary   DM: on SSI -- unable to add SGLT2    Anemia of chronic illness: Hgb remains 7.5-8 range  Overall, extremely poor prognosis, consider palliative care consult.      Will sign off, call with ?s.  For questions or updates, please contact [facility] consult [email or web address] for contact info under        Signed, ArEarly OsmondMD  10/01/2021, 6:03 PM

## 2021-10-01 NOTE — Progress Notes (Signed)
Pt returned to unit from HD. Placed to unit monitor. VSS. Pt drowsy. Report given to night RN.

## 2021-10-01 NOTE — Progress Notes (Signed)
Inpatient Diabetes Program Recommendations  AACE/ADA: New Consensus Statement on Inpatient Glycemic Control (2015)  Target Ranges:  Prepandial:   less than 140 mg/dL      Peak postprandial:   less than 180 mg/dL (1-2 hours)      Critically ill patients:  140 - 180 mg/dL   Lab Results  Component Value Date   GLUCAP 232 (H) 10/01/2021   HGBA1C 5.5 09/15/2021    Review of Glycemic Control  Latest Reference Range & Units 09/30/21 22:13 09/30/21 23:32 10/01/21 03:26 10/01/21 07:49  Glucose-Capillary 70 - 99 mg/dL 226 (H) 266 (H) 249 (H) 232 (H)  (H): Data is abnormally high  Current orders for Inpatient glycemic control: Semglee 23 units BID, Novolog 0-15 units Q4H, Novolog 4 units Q4H tube feed coverage, Tube feeds at 55 ml/hr  Inpatient Diabetes Program Recommendations:    Novolog 6 units Q4H tube feed coverage (hold if feeds are held or discontinued)  Will continue to follow while inpatient.  Thank you, Reche Dixon, MSN, RN Diabetes Coordinator Inpatient Diabetes Program [phone] (team pager from 8a-5p)

## 2021-10-01 NOTE — Plan of Care (Signed)
°  Problem: Education: Goal: Knowledge of disease or condition will improve Outcome: Progressing Goal: Knowledge of secondary prevention will improve (SELECT ALL) Outcome: Progressing Goal: Knowledge of patient specific risk factors will improve (INDIVIDUALIZE FOR PATIENT) Outcome: Progressing Goal: Individualized Educational Video(s) Outcome: Progressing   Problem: Coping: Goal: Will verbalize positive feelings about self Outcome: Progressing Goal: Will identify appropriate support needs Outcome: Progressing   Problem: Self-Care: Goal: Ability to participate in self-care as condition permits will improve Outcome: Progressing Goal: Verbalization of feelings and concerns over difficulty with self-care will improve Outcome: Progressing Goal: Ability to communicate needs accurately will improve Outcome: Progressing   Problem: Nutrition: Goal: Risk of aspiration will decrease Outcome: Progressing Goal: Dietary intake will improve Outcome: Progressing   Problem: Ischemic Stroke/TIA Tissue Perfusion: Goal: Complications of ischemic stroke/TIA will be minimized Outcome: Progressing   Problem: Ischemic Stroke/TIA Tissue Perfusion: Goal: Complications of ischemic stroke/TIA will be minimized Outcome: Progressing

## 2021-10-01 NOTE — Progress Notes (Signed)
[facility] Progress Note  Subjective: seen in room, no c/o   Vitals:   10/01/21 1250 10/01/21 1300 10/01/21 1400 10/01/21 1500  BP: 123/71  (!) 148/95 (!) 140/92  Pulse: (!) 116 (!) 118 (!) 117 (!) 117  Resp:   (!) 31 (!) 25  Temp:      TempSrc:      SpO2: 94% 95% 100% 97%  Weight:      Height:        Physical Exam:   General elderly female in bed  Lungs clear bilat ant/ lat Heart S1S2 no rub Abdomen soft nontender nondistended Extremities right AKA and no left leg edema Neuro - awake and follows some commands Access:  RUA AVG +b/t, Ox 1      Home meds including:  asa, lipitor, hydralazine 10 tid non hd days, insulin glargine, imdur 15 qd, oxy IR prn, seroquel, renvela 2 ac tidc, coreg 3.125 bid , ticagrelor    OP HD: [facility] MWF    3h  70kg  F200 2/2.5 bath  350/600  RUA AVG  Hep 1000 then 500u/hr  - aranesp 35 ug q wk  - venofer 51m weekly   - hectorol 2.5 ug IV tiw  - last HD 12/23      CXR 12/27 - IMPRESSION:  New perihilar and left lower lobe airspace opacity suspicious for aspiration or edema.     Assessment/ Plan: Acute CVA / R ICA occlusion - s/p mech thrombectomy of R ICA 12/27 Acute hypoxic resp failure - sp vent, on nasal cannula now Hypotension - on midodrine 10 mg TID.  +hx HTN on low dose meds at home, have all been on hold here Volume - 1kg over dry today, no ^vol on exam ESRD - HD per MWF schedule. HD today, or possibly will be bumped to tomorrow. HD time will be shortened due to high census/ staffing issues.   Fever/leukocytosis - PNA. SP course of IV zosyn 7 days.  WBC improving.  Recent +COVID - on last admit; not on precautions here Nutrition - on Nepro TF's H/o CM - LVEF 20-25% last echo Anemia ckd - aranesp increased to 100 mcg weekly and now ordered every Friday.  MBD ckd - resumed renvela at 1 TID with meals for now. Started back hectorol with HD. Currently NPO on nepro feeds. Diet per primary team    RKelly Splinter  MD 10/01/2021, 3:34 PM     Recent Labs  Lab 09/28/21 0927 09/28/21 1108 09/29/21 0446 09/30/21 0418 10/01/21 0405  K 3.8  --    < > 4.5 5.7*  BUN 70*  --    < > 68* 116*  CREATININE 5.34*  --    < > 4.89* 6.36*  CALCIUM 8.5*  --    < > 8.5* 8.9  PHOS 4.5 4.4  --   --   --   HGB  --   --    < > 7.7* 8.3*   < > = values in this interval not displayed.    Inpatient medications:  apixaban  5 mg Per Tube BID   aspirin  81 mg Per Tube Daily   atorvastatin  80 mg Per Tube Daily   carvedilol  3.125 mg Per Tube BID WC   chlorhexidine gluconate (MEDLINE KIT)  15 mL Mouth Rinse BID   Chlorhexidine Gluconate Cloth  6 each Topical Q0600   darbepoetin (ARANESP) injection - NON-DIALYSIS  100 mcg Subcutaneous Q Fri-1800   doxercalciferol  2.5 mcg Intravenous Q M,W,F-HD   ezetimibe  10 mg Per Tube Daily   feeding supplement (PROSource TF)  90 mL Per Tube BID   fiber  1 packet Per Tube BID   insulin aspart  0-15 Units Subcutaneous Q4H   insulin aspart  6 Units Subcutaneous Q4H   insulin glargine-yfgn  23 Units Subcutaneous BID   ipratropium-albuterol  3 mL Nebulization BID   mouth rinse  15 mL Mouth Rinse 10 times per day   midodrine  5 mg Per Tube TID WC   nutrition supplement (JUVEN)  1 packet Per Tube BID BM   pantoprazole sodium  40 mg Per Tube QHS   sevelamer carbonate  0.8 g Per Tube TID WC    sodium chloride Stopped (09/21/21 1234)   sodium chloride     feeding supplement (OSMOLITE 1.5 CAL) 1,000 mL (10/01/21 1434)   acetaminophen **OR** acetaminophen (TYLENOL) oral liquid 160 mg/5 mL **OR** acetaminophen, guaiFENesin, loperamide HCl, nitroGLYCERIN, ondansetron (ZOFRAN) IV

## 2021-10-01 NOTE — Progress Notes (Signed)
Pt to HD for routine tx. Will be monitored by HD RN.

## 2021-10-01 NOTE — Progress Notes (Signed)
°  Progress Note   Date: 09/30/2021  Patient Name: Jenna Brown        MRN#: [id number]  Clarification of diagnosis:  Chronic systolic and diastolic CHF

## 2021-10-01 NOTE — Progress Notes (Signed)
Progress Note   Patient: Jenna Brown XNT:[id number] DOB: 07-07-1951 DOA: 09/14/2021     17 DOS: the patient was seen and examined on 10/01/2021   Brief hospital course: 71 year old woman who [facility] to [facility] 12/27 with left-sided weakness, left facial droop and slurred speech, found to have acute right ICA stroke, s/p mechanical thrombectomy 12/28. PMHx significant for HTN, HLD, T2DM, CAD, PVD (s/p R AKA), ESRD on HD MWF. Of note, recent admission for COVID-19 infection (treated with monotherapy as an outpatient); she then developed chest pain with concern for possible NSTEMI.  [facility] requested to assume care on 1/12, plan to transfer out of ICU.  -stable from neuro standpoint.  Still has dysphagia, on cortrack for tube feeds -Extubated on 1/7, O2 sats 95% on 2 L  Assessment and Plan * Acute ischemic stroke ([place])- (present on admission) - Presented with left-sided weakness, left facial droop and slurred speech.  TN K not given as presented outside the window -CTA head and neck showed LVO in the proximal right ICA, isolated right M1 segment, P2 high right PCA, left vertebral artery occlusion, stenotic right vertebral artery, high-grade mid basilar stenosis.  CT perfusion 29/197. -MRI showed acute infarcts in the right MCA and PCA territory, punctate foci of restricted diffusion in bilateral frontal thyroid lobes as well as left occipital lobe -Status post mechanical thrombectomy with TICI 3 at right ICA -MRA showed resolution of right ICA occlusion -2D echo showed EF 25- 30%, no LV thrombus.  LDL 74.  A1c 5.5 -PTA patient was on aspirin 81 mg daily and Brilinta 90 mg twice daily, was placed on heparin IV, now transitioned to aspirin 81 mg daily, apixaban 5 mg twice daily -Neurology following, recommended to check 2D echo in 3 months, if EF more than 30 to 35%, eliquis can be switched back to Brilinta. -Currently on core track and tube feeds, will repeat SLP evaluation   Cellulitis of foot,  left- (present on admission) - Patient noted to have erythema, blackish left second and fourth toes, complaining of pain -Obtain MRI of the left foot, procalcitonin, blood cultures.  WBC count 19.6. -If MRI shows osteomyelitis, will discuss with vascular surgery  -ABI in 06/2021 had shown noncompressible left lower extremity extremity arteries  Dysphagia - Serial SLP evaluation, currently has core track and on puree/dysphagia 1 diet -Per staff, patient has not been consistently eating even the dysphagia 1 diet. - Will change to tube feeds to continuous, repeat SLP evaluation.  If no significant improvement or gross aspiration, will need to discuss goals of care regarding PEG tube -Palliative medicine consulted for [place].  Klebsiella pneumonia ([place])- (present on admission) - Initially was placed on ceftriaxone, then transition to IV vancomycin and Zosyn - Completed antibiotics (Zosyn) after 7-day course  Acute respiratory failure with hypoxia ([place])- (present on admission) - In the setting of Klebsiella pneumonia, COVID-19, small left lung pleural effusion  -Completed antibiotics (Zosyn) after 7-day course on 1/11 -Continue aspiration precautions  COVID-19 virus infection- (present on admission) - Asymptomatic  NSTEMI (non-ST elevated myocardial infarction) ([place])- (present on admission) - Severe ischemic cardiomyopathy, NSTEMI in the setting of recent ischemic CVA.  Troponins peaked at 13,028 -2D echo showed EF of 25 to 30%, global hypokinesis, grade 1 DD -Cardiology following, treated with IV heparin for 48 hours, now transitioned to aspirin 81 mg daily with eliquis. -Continue statin, Zetia  ESRD (end stage renal disease) on dialysis ([place]) - On hemodialysis, MWF, nephrology following -Has anemia of chronic disease,  and last increase to 161mcg weekly  Insulin-requiring or dependent type II diabetes mellitus ([place]) - on tube feed coverage -Not well controlled with hyperglycemia, continue  Semglee to 23 units BID, increase NovoLog to 6 units every 4 hours  -Hemoglobin A1c 5.5 on 09/15/2021 Recent Labs    09/29/21 1956 09/29/21 2340 09/30/21 0348 09/30/21 0723 09/30/21 1002 09/30/21 1117  GLUCAP 190* 222* 190* 206* 265* 267*    Essential hypertension- (present on admission) - BP now improved, decrease midodrine today, will DC if BP remains stable - patient has tachycardia added Coreg 3.125 mg p.o. twice daily  Pressure injury of skin- (present on admission) Pressure Injury 09/17/21 Coccyx Medial Deep Tissue Pressure Injury - Purple or maroon localized area of discolored intact skin or blood-filled blister due to damage of underlying soft tissue from pressure and/or shear. (Active)  09/17/21 1610  Location: Coccyx  Location Orientation: Medial  Staging: Deep Tissue Pressure Injury - Purple or maroon localized area of discolored intact skin or blood-filled blister due to damage of underlying soft tissue from pressure and/or shear.  Wound Description (Comments):   Present on Admission:      Pressure Injury 09/28/21 Thigh Posterior;Upper;Left Stage 2 -  Partial thickness loss of dermis presenting as a shallow open injury with a red, pink wound bed without slough. upper/inner-thigh (Active)  09/28/21 0800  Location: Thigh  Location Orientation: Posterior;Upper;Left  Staging: Stage 2 -  Partial thickness loss of dermis presenting as a shallow open injury with a red, pink wound bed without slough.  Wound Description (Comments): upper/inner-thigh  Present on Admission: No        Subjective: Alert and awake, no acute issues overnight.  Still has dysphagia, not consistently eating dysphagia 1 diet.  Left toes somewhat blackish.  Objective BP (!) 148/95    Pulse (!) 117    Temp 99.3 F (37.4 C) (Oral)    Resp (!) 31    Ht 5\' 4"  (1.626 m)    Wt 71.8 kg    SpO2 100%    BMI 27.17 kg/m   Physical Exam General: Alert and oriented, following commands, core  track Cardiovascular: S1 S2 clear, RRR.  Respiratory: Diminished breath sounds with scattered wheezing Gastrointestinal: Soft, nontender, nondistended, NBS Ext: right AKA Neuro: left-sided weakness persisting   Data Reviewed:  CBC Latest Ref Rng & Units 10/01/2021 09/30/2021 09/29/2021  WBC 4.0 - 10.5 K/uL 19.6(H) 22.5(H) 22.3(H)  Hemoglobin 12.0 - 15.0 g/dL 8.3(L) 7.7(L) 7.9(L)  Hematocrit 36.0 - 46.0 % 29.5(L) 25.6(L) 26.4(L)  Platelets 150 - 400 K/uL 290 290 265    BMP Latest Ref Rng & Units 10/01/2021 09/30/2021 09/29/2021  Glucose 70 - 99 mg/dL 239(H) 209(H) 250(H)  BUN 8 - 23 mg/dL 116(H) 68(H) 94(H)  Creatinine 0.44 - 1.00 mg/dL 6.36(H) 4.89(H) 6.45(H)  Sodium 135 - 145 mmol/L 133(L) 133(L) 136  Potassium 3.5 - 5.1 mmol/L 5.7(H) 4.5 4.9  Chloride 98 - 111 mmol/L 94(L) 93(L) 96(L)  CO2 22 - 32 mmol/L 18(L) 24 21(L)  Calcium 8.9 - 10.3 mg/dL 8.9 8.5(L) 8.4(L)     Family Communication:   Disposition: Status is: Inpatient  Remains inpatient appropriate because: Ongoing work-up, persistent dysphagia  DVT prophylaxis: Apixaban   Time spent: 35 minutes  Author: Estill Cotta, MD 10/01/2021 2:42 PM  For on call review [email or web address].

## 2021-10-01 NOTE — Assessment & Plan Note (Addendum)
-   Patient noted to have erythema, blackish left second and fourth toes, tenderness, concern for infection and ischemia. -ABI still pending, previously in 10/22 had shown noncompressible left lower extremity arteries -MRI of the left foot showed T2 hyperintense signal in the tuft of the first distal phalanx, reactive edema versus osteomyelitis -Patient was evaluated by vascular surgery, Dr. Stanford Breed  recommended definitive therapy would be AKA, for now continue wound care and palliative support. -Goals of care addressed, now comfort care status

## 2021-10-01 NOTE — Progress Notes (Signed)
Skin assessment done during pt bath. Pt w/ hx of vascular insufficiency, but L toes appear black. Toes 2 and 4 appear darker than others. Pt c/o pain of toes and surrounding area. Surrounding areas are blanchable w/ good cap refill. DP and PT pulses are difficult to find with Doppler but are present and this is unchanged from previous assessments. Dr. Tana Coast notified and will be in to assess.

## 2021-10-01 NOTE — Progress Notes (Signed)
Nutrition Brief Note  TF changed to nocturnal to encourage PO intake, however pt continues to not take POs despite encouragement.   MD would like to resume continuous TF.   Initiate tube feeding via Cortrak tube (gastric): Osmolite 1.5 at 45 ml/h (1080 ml per day) Prosource TF 90 ml BID  Provides 1780 kcal, 104 gm protein, 820 ml free water daily  Continue Juven BID via tube   Lockie Pares., RD, LDN, CNSC See AMiON for contact information

## 2021-10-02 ENCOUNTER — Encounter (HOSPITAL_COMMUNITY): Payer: Medicare Other

## 2021-10-02 ENCOUNTER — Inpatient Hospital Stay (HOSPITAL_COMMUNITY): Payer: Medicare Other

## 2021-10-02 DIAGNOSIS — E119 Type 2 diabetes mellitus without complications: Secondary | ICD-10-CM

## 2021-10-02 DIAGNOSIS — Z7982 Long term (current) use of aspirin: Secondary | ICD-10-CM

## 2021-10-02 DIAGNOSIS — Z87891 Personal history of nicotine dependence: Secondary | ICD-10-CM

## 2021-10-02 DIAGNOSIS — I639 Cerebral infarction, unspecified: Secondary | ICD-10-CM

## 2021-10-02 DIAGNOSIS — Z79899 Other long term (current) drug therapy: Secondary | ICD-10-CM

## 2021-10-02 DIAGNOSIS — I10 Essential (primary) hypertension: Secondary | ICD-10-CM

## 2021-10-02 DIAGNOSIS — I70262 Atherosclerosis of native arteries of extremities with gangrene, left leg: Secondary | ICD-10-CM

## 2021-10-02 DIAGNOSIS — I214 Non-ST elevation (NSTEMI) myocardial infarction: Secondary | ICD-10-CM

## 2021-10-02 DIAGNOSIS — Z794 Long term (current) use of insulin: Secondary | ICD-10-CM

## 2021-10-02 DIAGNOSIS — Z95820 Peripheral vascular angioplasty status with implants and grafts: Secondary | ICD-10-CM

## 2021-10-02 LAB — GLUCOSE, CAPILLARY
Glucose-Capillary: 140 mg/dL — ABNORMAL HIGH (ref 70–99)
Glucose-Capillary: 147 mg/dL — ABNORMAL HIGH (ref 70–99)
Glucose-Capillary: 162 mg/dL — ABNORMAL HIGH (ref 70–99)
Glucose-Capillary: 181 mg/dL — ABNORMAL HIGH (ref 70–99)
Glucose-Capillary: 213 mg/dL — ABNORMAL HIGH (ref 70–99)
Glucose-Capillary: 228 mg/dL — ABNORMAL HIGH (ref 70–99)

## 2021-10-02 LAB — BASIC METABOLIC PANEL
Anion gap: 20 — ABNORMAL HIGH (ref 5–15)
BUN: 109 mg/dL — ABNORMAL HIGH (ref 8–23)
CO2: 23 mmol/L (ref 22–32)
Calcium: 8.9 mg/dL (ref 8.9–10.3)
Chloride: 92 mmol/L — ABNORMAL LOW (ref 98–111)
Creatinine, Ser: 5.71 mg/dL — ABNORMAL HIGH (ref 0.44–1.00)
GFR, Estimated: 8 mL/min — ABNORMAL LOW (ref >60)
Glucose, Bld: 185 mg/dL — ABNORMAL HIGH (ref 70–99)
Potassium: 5.6 mmol/L — ABNORMAL HIGH (ref 3.5–5.1)
Sodium: 135 mmol/L (ref 135–145)

## 2021-10-02 MED ORDER — SODIUM ZIRCONIUM CYCLOSILICATE 10 G PO PACK
10.0000 g | PACK | Freq: Two times a day (BID) | ORAL | Status: AC
  Administered 2021-10-02 (×2): 10 g via ORAL
  Filled 2021-10-02 (×2): qty 1

## 2021-10-02 MED ORDER — NEPRO/CARBSTEADY PO LIQD
45.0000 mL/h | ORAL | Status: DC
  Filled 2021-10-02: qty 237

## 2021-10-02 MED ORDER — APIXABAN 5 MG PO TABS
5.0000 mg | ORAL_TABLET | Freq: Two times a day (BID) | ORAL | Status: DC
  Administered 2021-10-02 (×2): 5 mg
  Filled 2021-10-02 (×3): qty 1

## 2021-10-02 MED ORDER — NEPRO/CARBSTEADY PO LIQD
1000.0000 mL | ORAL | Status: DC
  Administered 2021-10-02: 1000 mL
  Filled 2021-10-02: qty 1000

## 2021-10-02 MED ORDER — SODIUM CHLORIDE 0.9 % IV SOLN
1.0000 g | INTRAVENOUS | Status: DC
  Administered 2021-10-02: 1 g via INTRAVENOUS
  Filled 2021-10-02: qty 10

## 2021-10-02 NOTE — Progress Notes (Signed)
NIH change to 13 at 0818 assessment from previously documented 9, Attending MD and Neuro NP notified, Rapid Response RN notified, Stat Head CT ordered, patient transported to CT and back in 3w07, Neuro NP notified, awaiting new orders

## 2021-10-02 NOTE — Significant Event (Signed)
Rapid Response Event Note   Reason for Call :  Change in NIH  Initial Focused Assessment:  Called by the bedside nurse because her patient has had an increase in NIH score to 13 from 9. Patient has neglect on her left side, left facial droop, and dysarthria on exam.   CT results show evolving right cerebral infarcts.  BP 143/61 HR 109 O2 100 RR 27   Interventions:  CT scan obtained ASA and Eliquis held Midodrine held  Plan of Care:  Patient will remain on 3W.   Event Summary:   MD Notified: Dr. Tana Coast and Stroke APP Call Time: 0900 Arrival Time: 0905 End Time: Onycha, RN

## 2021-10-02 NOTE — Consult Note (Signed)
[facility] [place]  ASSESSMENT / PLAN: Jenna Brown is a 71 y.o. 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 Patients with chronic limb threatening ischemia have an annual risk of cardiovascular mortality of 25% and a risk of amputation.   WIfI score calculated based on clinical exam and non-invasive measurements: 1 / 2 / 0. Clinical stage 2. Low risk of amputation. Moderate potential benefit from revascularization.  Recommend the following which can slow the progression of atherosclerosis and reduce the risk of major adverse cardiac / limb events:  Complete cessation from all tobacco products. Blood glucose control with goal A1c < 7%. Blood pressure control with goal blood pressure < 140/90 mmHg. Lipid reduction therapy with goal LDL-C <100 mg/dL (<70 if symptomatic from PAD).  Aspirin 22m PO QD.  Atorvastatin 40-80mg PO QD (or other "high intensity" statin therapy).  Patient has previously undergone endovascular recanalization of an occluded left femoral-popliteal artery.  I suspect this is occluded.  Given her contralateral amputation, and dense stroke deficit, she is a poor revascularization candidate.  I would recommend local wound care only to the foot with Betadine.  Definitive therapy would involve an above-knee amputation.  Amputation can be done electively.  A palliative care consultation may be helpful to her and her family given her globally poor prognosis.  CHIEF COMPLAINT: Left foot gangrene  HISTORY OF PRESENT ILLNESS: Jenna LAUGHRIDGEis a 71y.o. female with extensive past medical history, admitted to the hospital 09/14/2021 for left sided weakness, left facial droop, and slurred speech.  Her NIH stroke scale at presentation was 24.  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  She was doing well initially, and then suffered  an NSTEMI 09/27/2021.  She was deemed not a candidate for revascularization.  She was treated with maximal medical therapy.  I was consulted today for evaluation of chronic appearing gangrenous changes to her left toes.  On my evaluation, the patient is confused and cannot answer simple orientation questions for me.  She had a rapid response called earlier this morning with a duration of her NIH stroke scale from 9 --> 13.   VASCULAR SURGICAL HISTORY: Left femoral-popliteal recanalization with angioplasty and stenting 03/17/2020 with Dr. BTrula Sladefor left foot ulcer  VASCULAR RISK FACTORS: Positive history of stroke / transient ischemic attack. Positive history of coronary artery disease.  Positive history of diabetes mellitus. Last A1c 5.5. Positive history of smoking.  Positive history of hypertension.  Positive history of chronic kidney disease.  End-stage renal disease. Negative history of chronic obstructive pulmonary disease.  FUNCTIONAL STATUS: ECOG performance status: (4) Completely disabled, unable to carry out self-care and confined to bed or chair Ambulatory status: Nonambulatory  Past Medical History:  Diagnosis Date   Anemia    Anxiety    takes Xanax prn   Arthritis    Cataract    left eye   CKD (chronic kidney disease), stage IV (HCC)    Coronary artery disease 2016   cath w/ 90% LAD, 95%CFX, 80% OM 2, 60% RCA, not CABG candidate, rx medically   Full dentures    GERD (gastroesophageal reflux disease)    H/O hiatal hernia    Headache(784.0)    when b/p is elevated   Hemorrhoids    Hx of AKA (above knee amputation), right (HCC)    Hyperlipidemia    takes Simvastatin daily   Hypertension  takes Amlodipine/HCTZ/Losartan daily   Migraines    Myocardial infarction [facility]) 1990's   NSTEMI (non-ST elevated myocardial infarction) ([place])    Peripheral vascular disease (HCC)    PONV (postoperative nausea and vomiting)    Stroke ([place])    TIA history   Type II diabetes  mellitus ([place])    on Lantus   Vertigo    takes Ativert prn    Past Surgical History:  Procedure Laterality Date   ABDOMINAL AORTAGRAM N/A 02/29/2012   Procedure: ABDOMINAL Maxcine Ham;  Surgeon: [name], MD;  Location: [facility] CATH LAB;  Service: Cardiovascular;  Laterality: N/A;   ABDOMINAL AORTOGRAM W/LOWER EXTREMITY N/A 02/11/2020   Procedure: ABDOMINAL AORTOGRAM W/LOWER EXTREMITY;  Surgeon: [name], MD;  Location: [place] CV LAB;  Service: Cardiovascular;  Laterality: N/A;   ABDOMINAL AORTOGRAM W/LOWER EXTREMITY N/A 03/17/2020   Procedure: ABDOMINAL AORTOGRAM W/LOWER EXTREMITY;  Surgeon: [name], MD;  Location: [place] CV LAB;  Service: Cardiovascular;  Laterality: N/A;   AMPUTATION  05/10/2012   Procedure: AMPUTATION ABOVE KNEE;  Surgeon: [name], MD;  Location[facility] OR;  Service: Vascular;  Laterality: Right;   open right groin wound noted   aortogram  02/2012   AV FISTULA PLACEMENT Right 03/04/2019   Procedure: ARTERIOVENOUS (AV) FISTULA CREATION;  Surgeon: Rosetta Posner, MD;  Location: [place];  Service: Vascular;  Laterality: Right;   CARDIAC CATHETERIZATION N/A 04/13/2015   Procedure: Right/Left Heart Cath and Coronary Angiography;  Surgeon: [name], MD;  Location: [place] CV LAB;  Service: Cardiovascular;  Laterality: N/A;   CLEFT PALATE REPAIR     several   COLONOSCOPY     DILATION AND CURETTAGE OF UTERUS     FEMORAL-POPLITEAL BYPASS GRAFT  04/05/2012   Procedure: BYPASS GRAFT FEMORAL-POPLITEAL ARTERY;  Surgeon: [name], MD;  Location: [place];  Service: Vascular;  Laterality: Right;  REVISION   FEMORAL-TIBIAL BYPASS GRAFT  03/29/2012   Procedure: BYPASS GRAFT FEMORAL-TIBIAL ARTERY;  Surgeon: [name], MD;  Location: [facility] OR;  Service: Vascular;  Laterality: Right;  Right femoral to Posterior Tibialis with composite graft of 108m x 80 cm ringed gortex graft and saphenous vein  ,intraoperative arteriogram   FEMOROPOPLITEAL  THROMBECTOMY / EMBOLECTOMY  05/09/2012   IR CT HEAD LTD  09/14/2021   IR FLUORO GUIDE CV LINE RIGHT  03/01/2019   IR PERCUTANEOUS ART THROMBECTOMY/INFUSION INTRACRANIAL INC DIAG ANGIO  09/14/2021   IR [place] ACCESS LEFT  09/14/2021   IR [place] VASC ACCESS RIGHT  03/01/2019   MULTIPLE TOOTH EXTRACTIONS     MYOMECTOMY     PERIPHERAL VASCULAR INTERVENTION Left 03/17/2020   Procedure: PERIPHERAL VASCULAR INTERVENTION;  Surgeon: BSerafina Mitchell MD;  [facility];  Service: Cardiovascular;  Laterality: Left;  popliteal   PR VEIN BYPASS GRAFT,AORTO-FEM-POP  05/27/11   Right SFA-Below knee Pop BP   RADIOLOGY WITH ANESTHESIA N/A 09/14/2021   Procedure: IR WITH ANESTHESIA;  Surgeon: Radiologist, Medication, MD;  [place] [facility]  Service: Radiology;  Laterality: N/A;   REVISION OF ARTERIOVENOUS GORETEX GRAFT Right 08/21/2019   Procedure: REVISION OF ARTERIOVENOUS FISTULA RIGHT ARM;  Surgeon: ERosetta Posner MD;  Location: [facility] OR;  Service: Vascular;  Laterality: Right;   TEE WITHOUT CARDIOVERSION N/A 01/03/2017   Procedure: TRANSESOPHAGEAL ECHOCARDIOGRAM (TEE);  Surgeon: TSkeet Latch MD;  [facility];  Service: Cardiovascular;  Laterality: N/A;   THORACENTESIS  02/27/2019  TONSILLECTOMY     TUBAL LIGATION  ~ 1990    Family History  Problem Relation Age of Onset   Cancer Mother        breast   Diabetes Father    Cancer Brother    Heart attack Maternal Grandmother    Heart attack Maternal Grandfather     Social History   Socioeconomic History   Marital status: Single    Spouse name: Not on file   Number of children: Not on file   Years of education: Not on file   Highest education level: Not on file  Occupational History   Occupation: disabled  Tobacco Use   Smoking status: Former    Packs/day: 0.25    Years: 40.00    Pack years: 10.00    Types: Cigarettes    Quit date: 02/27/2012    Years since quitting: 9.6   Smokeless tobacco: Never   Vaping Use   Vaping Use: Never used  Substance and Sexual Activity   Alcohol use: No   Drug use: No   Sexual activity: Never    Birth control/protection: Post-menopausal  Other Topics Concern   Not on file  Social History Narrative   She lives in [place] with family.   Social Determinants of Health   Financial Resource Strain: Not on file  Food Insecurity: Not on file  Transportation Needs: Not on file  Physical Activity: Not on file  Stress: Not on file  Social Connections: Not on file  Intimate Partner Violence: Not on file    Allergies  Allergen Reactions   Plavix [Clopidogrel Bisulfate] Itching   Codeine Other (See Comments)    "makes me feel strange"   Hydrocodone Other (See Comments)    Feels disorinated   Losartan Other (See Comments)   Tylenol With Codeine #3 [Acetaminophen-Codeine] Other (See Comments)    "doesn't make me feel right"    Current Facility-Administered Medications  Medication Dose Route Frequency Provider Last Rate Last Admin   0.9 %  sodium chloride infusion  250 mL Intravenous Continuous Rai, Ripudeep K, MD   Stopped at 09/21/21 1234   0.9 %  sodium chloride infusion  250 mL Intravenous Continuous Rai, Ripudeep K, MD       acetaminophen (TYLENOL) tablet 650 mg  650 mg Oral Q4H PRN Rai, Ripudeep K, MD       Or   acetaminophen (TYLENOL) 160 MG/5ML solution 650 mg  650 mg Per Tube Q4H PRN Rai, Ripudeep K, MD   650 mg at 09/29/21 2135   Or   acetaminophen (TYLENOL) suppository 650 mg  650 mg Rectal Q4H PRN Rai, Ripudeep K, MD       apixaban (ELIQUIS) tablet 5 mg  5 mg Per Tube BID Janine Ores, NP   5 mg at 10/02/21 1239   aspirin chewable tablet 81 mg  81 mg Per Tube Daily Rai, Ripudeep K, MD   81 mg at 10/01/21 1133   atorvastatin (LIPITOR) tablet 80 mg  80 mg Per Tube Daily Rai, Ripudeep K, MD   80 mg at 10/02/21 0943   carvedilol (COREG) tablet 3.125 mg  3.125 mg Per Tube BID WC Rai, Ripudeep K, MD   3.125 mg at 10/02/21 0939   cefTRIAXone  (ROCEPHIN) 1 g in sodium chloride 0.9 % 100 mL IVPB  1 g Intravenous Q24H Rai, Ripudeep K, MD 200 mL/hr at 10/02/21 1302 1 g at 10/02/21 1302   chlorhexidine gluconate (MEDLINE KIT) (PERIDEX) 0.12 % solution  15 mL  15 mL Mouth Rinse BID Rai, Ripudeep K, MD   15 mL at 10/01/21 2013   Chlorhexidine Gluconate Cloth 2 % PADS 6 each  6 each Topical Q0600 Rai, Ripudeep Raliegh Ip, MD   6 each at 10/02/21 0614   Darbepoetin Alfa (ARANESP) injection 100 mcg  100 mcg Subcutaneous Q Fri-1800 Rai, Ripudeep K, MD   100 mcg at 10/01/21 1659   doxercalciferol (HECTOROL) injection 2.5 mcg  2.5 mcg Intravenous Q M,W,F-HD Rai, Ripudeep K, MD   2.5 mcg at 10/01/21 1227   ezetimibe (ZETIA) tablet 10 mg  10 mg Per Tube Daily Rai, Ripudeep K, MD   10 mg at 10/02/21 0943   feeding supplement (NEPRO CARB STEADY) liquid 1,000 mL  1,000 mL Per Tube Continuous Rai, Ripudeep K, MD       feeding supplement (PROSource TF) liquid 90 mL  90 mL Per Tube BID Rai, Ripudeep K, MD   90 mL at 10/02/21 1239   fiber (NUTRISOURCE FIBER) 1 packet  1 packet Per Tube BID Rai, Ripudeep K, MD   1 packet at 10/02/21 0939   guaiFENesin (ROBITUSSIN) 100 MG/5ML liquid 5 mL  5 mL Per Tube Q4H PRN Rai, Ripudeep K, MD       insulin aspart (novoLOG) injection 0-15 Units  0-15 Units Subcutaneous Q4H Rai, Ripudeep K, MD   2 Units at 10/02/21 1240   insulin aspart (novoLOG) injection 6 Units  6 Units Subcutaneous Q4H Rai, Ripudeep K, MD   6 Units at 10/02/21 1241   insulin glargine-yfgn (SEMGLEE) injection 23 Units  23 Units Subcutaneous BID Rai, Ripudeep K, MD   23 Units at 10/01/21 2210   ipratropium-albuterol (DUONEB) 0.5-2.5 (3) MG/3ML nebulizer solution 3 mL  3 mL Nebulization BID Rai, Ripudeep K, MD   3 mL at 10/02/21 0830   loperamide HCl (IMODIUM) 1 MG/7.5ML suspension 4 mg  4 mg Per Tube PRN Rai, Ripudeep K, MD       MEDLINE mouth rinse  15 mL Mouth Rinse 10 times per day Rai, Ripudeep K, MD   15 mL at 10/02/21 1242   midodrine (PROAMATINE) tablet 5 mg  5  mg Per Tube TID WC Rai, Ripudeep K, MD   5 mg at 10/02/21 1239   nitroGLYCERIN (NITROSTAT) SL tablet 0.4 mg  0.4 mg Sublingual Q5 min PRN Rai, Ripudeep K, MD   0.4 mg at 09/27/21 2116   nutrition supplement (JUVEN) (JUVEN) powder packet 1 packet  1 packet Per Tube BID BM Rai, Ripudeep K, MD   1 packet at 10/02/21 0939   ondansetron (ZOFRAN) injection 4 mg  4 mg Intravenous Q6H PRN Rai, Ripudeep K, MD   4 mg at 09/30/21 1757   pantoprazole sodium (PROTONIX) 40 mg/20 mL oral suspension 40 mg  40 mg Per Tube QHS Rai, Ripudeep K, MD   40 mg at 10/01/21 2145   sevelamer carbonate (RENVELA) packet 0.8 g  0.8 g Per Tube TID WC Rai, Ripudeep K, MD   0.8 g at 10/02/21 1242   sodium zirconium cyclosilicate (LOKELMA) packet 10 g  10 g Oral BID Roney Jaffe, MD   10 g at 10/02/21 1240    REVIEW OF SYSTEMS:  Unable to obtain - confused.  PHYSICAL EXAM Vitals:   10/02/21 0845 10/02/21 0930 10/02/21 0939 10/02/21 1152  BP:  140/78 (!) 143/61 110/67  Pulse:  (!) 109 (!) 109 97  Resp:    18  Temp:    98.2  F (36.8 C)  TempSrc:      SpO2: 98% 100%  100%  Weight:      Height:        Constitutional: Chronically ill. No distress. Appears well nourished.  Neurologic: GCS 11. A&O x 0. Left arm flaccid. Left leg with sensation and withdrawal to noxious stimulus. Right arm and leg movement spontaneous.  Psychiatric: unable to assess given mental status.  Eyes:  No icterus. No conjunctival pallor. Ears, nose, throat: feeding tube in right nare. Mucous membranes moist. Midline trachea.  Cardiac: regular rate and rhythm.  Respiratory:  unlabored. Abdominal:  soft, non-tender, non-distended.  Peripheral vascular: no palpable pedal pulses. No doppler machine available. Right above knee amputation well healed. Extremity: no edema. no cyanosis. no pallor.  Skin:   Lymphatic: no Stemmer's sign. no palpable lymphadenopathy.  PERTINENT LABORATORY AND RADIOLOGIC DATA  Most recent CBC CBC Latest Ref Rng &  Units 10/01/2021 09/30/2021 09/29/2021  WBC 4.0 - 10.5 K/uL 19.6(H) 22.5(H) 22.3(H)  Hemoglobin 12.0 - 15.0 g/dL 8.3(L) 7.7(L) 7.9(L)  Hematocrit 36.0 - 46.0 % 29.5(L) 25.6(L) 26.4(L)  Platelets 150 - 400 K/uL 290 290 265     Most recent CMP CMP Latest Ref Rng & Units 10/02/2021 10/01/2021 09/30/2021  Glucose 70 - 99 mg/dL 185(H) 239(H) 209(H)  BUN 8 - 23 mg/dL 109(H) 116(H) 68(H)  Creatinine 0.44 - 1.00 mg/dL 5.71(H) 6.36(H) 4.89(H)  Sodium 135 - 145 mmol/L 135 133(L) 133(L)  Potassium 3.5 - 5.1 mmol/L 5.6(H) 5.7(H) 4.5  Chloride 98 - 111 mmol/L 92(L) 94(L) 93(L)  CO2 22 - 32 mmol/L 23 18(L) 24  Calcium 8.9 - 10.3 mg/dL 8.9 8.9 8.5(L)  Total Protein 6.5 - 8.1 g/dL - - -  Total Bilirubin 0.3 - 1.2 mg/dL - - -  Alkaline Phos 38 - 126 U/L - - -  AST 15 - 41 U/L - - -  ALT 0 - 44 U/L - - -    Renal function Estimated Creatinine Clearance: 9 mL/min (A) (by C-G formula based on SCr of 5.71 mg/dL (H)).  Hgb A1c MFr Bld (%)  Date Value  09/15/2021 5.5    LDL Cholesterol  Date Value Ref Range Status  09/15/2021 74 0 - 99 mg/dL Final    Comment:           Total Cholesterol/HDL:CHD Risk Coronary Heart Disease Risk Table                     Men   Women  1/2 Average Risk   3.4   3.3  Average Risk       5.0   4.4  2 X Average Risk   9.6   7.1  3 X Average Risk  23.4   11.0        Use the calculated Patient Ratio above and the CHD Risk Table to determine the patient's CHD Risk.        ATP III CLASSIFICATION (LDL):  <100     mg/dL   Optimal  100-129  mg/dL   Near or Above                    Optimal  130-159  mg/dL   Borderline  160-189  mg/dL   High  >190     mg/dL   Very High Performed at [facility] [address] [place], [place] [address]      +-------+-----------+-----------+------------+------------+   ABI/TBI Today's ABI Today's  TBI Previous ABI Previous TBI   +-------+-----------+-----------+------------+------------+   Right   AKA         AKA         AKA           AKA            +-------+-----------+-----------+------------+------------+   Left    [place]          0.24        [place]           0.54           +-------+-----------+-----------+------------+------------+   Yevonne Aline. Stanford Breed, MD [facility] Phone Number: [phone] 10/02/2021 2:01 PM  Total time spent on preparing this encounter including chart review, data review, collecting history, examining the patient, coordinating care for this established patient, 40 minutes.  Portions of this report may have been transcribed using voice recognition software.  Every effort has been made to ensure accuracy; however, inadvertent computerized transcription errors may still be present.

## 2021-10-02 NOTE — Progress Notes (Signed)
[facility] Progress Note  Subjective: seen in room, no c/o   Vitals:   10/02/21 0845 10/02/21 0930 10/02/21 0939 10/02/21 1152  BP:  140/78 (!) 143/61 110/67  Pulse:  (!) 109 (!) 109 97  Resp:    18  Temp:    98.2 F (36.8 C)  TempSrc:      SpO2: 98% 100%  100%  Weight:      Height:        Physical Exam:   General elderly female in bed  Lungs clear bilat ant/ lat Heart S1S2 no rub Abdomen soft nontender nondistended Extremities right AKA and no left leg edema Neuro - awake, follows some commands Access:  RUA AVG +b/t, Ox 1      Home meds including:  asa, lipitor, hydralazine 10 tid non hd days, insulin glargine, imdur 15 qd, oxy IR prn, seroquel, renvela 2 ac tidc, coreg 3.125 bid , ticagrelor    OP HD: [facility] MWF    3h  70kg  F200 2/2.5 bath  350/600  RUA AVG  Hep 1000 then 500u/hr  - aranesp 35 ug q wk  - venofer 1m weekly   - hectorol 2.5 ug IV tiw  - last HD 12/23      CXR 12/27 - IMPRESSION:  New perihilar and left lower lobe airspace opacity suspicious for aspiration or edema.     Assessment/ Plan: Acute CVA / R ICA occlusion - s/p mech thrombectomy of R ICA 12/27 Acute hypoxic resp failure - sp vent, on nasal cannula now Hypotension - on midodrine 10 mg TID.  +hx HTN on low dose meds at home, have all been on hold here Volume - 1kg over dry today, no ^vol on exam ESRD - HD per MWF schedule. Labs not much better after shortened HD yesterday. Will repeat in am and consider early HD tomorrow if needed.   Fever/leukocytosis - PNA. SP course of IV zosyn 7 days.  WBC improving.  Recent +COVID - on last admit; not on precautions here Nutrition - on Nepro TF's H/o CM - LVEF 20-25% last echo Anemia ckd - aranesp increased to 100 mcg weekly and now ordered every Friday.  MBD ckd - resumed renvela at 1 TID with meals for now. Started back hectorol with HD. Ca in range. Currently NPO on nepro feeds. Diet per primary team    RKelly Splinter MD 10/02/2021,  2:40 PM     Recent Labs  Lab 09/28/21 0927 09/28/21 1108 09/29/21 0446 09/30/21 0418 10/01/21 0405 10/02/21 0407  K 3.8  --    < > 4.5 5.7* 5.6*  BUN 70*  --    < > 68* 116* 109*  CREATININE 5.34*  --    < > 4.89* 6.36* 5.71*  CALCIUM 8.5*  --    < > 8.5* 8.9 8.9  PHOS 4.5 4.4  --   --   --   --   HGB  --   --    < > 7.7* 8.3*  --    < > = values in this interval not displayed.    Inpatient medications:  apixaban  5 mg Per Tube BID   aspirin  81 mg Per Tube Daily   atorvastatin  80 mg Per Tube Daily   carvedilol  3.125 mg Per Tube BID WC   chlorhexidine gluconate (MEDLINE KIT)  15 mL Mouth Rinse BID   Chlorhexidine Gluconate Cloth  6 each Topical Q0600   darbepoetin (ARANESP) injection -  NON-DIALYSIS  100 mcg Subcutaneous Q Fri-1800   doxercalciferol  2.5 mcg Intravenous Q M,W,F-HD   ezetimibe  10 mg Per Tube Daily   feeding supplement (PROSource TF)  90 mL Per Tube BID   fiber  1 packet Per Tube BID   insulin aspart  0-15 Units Subcutaneous Q4H   insulin aspart  6 Units Subcutaneous Q4H   insulin glargine-yfgn  23 Units Subcutaneous BID   ipratropium-albuterol  3 mL Nebulization BID   mouth rinse  15 mL Mouth Rinse 10 times per day   midodrine  5 mg Per Tube TID WC   nutrition supplement (JUVEN)  1 packet Per Tube BID BM   pantoprazole sodium  40 mg Per Tube QHS   sevelamer carbonate  0.8 g Per Tube TID WC   sodium zirconium cyclosilicate  10 g Oral BID    sodium chloride Stopped (09/21/21 1234)   sodium chloride     cefTRIAXone (ROCEPHIN)  IV 1 g (10/02/21 1302)   feeding supplement (NEPRO CARB STEADY)     acetaminophen **OR** acetaminophen (TYLENOL) oral liquid 160 mg/5 mL **OR** acetaminophen, guaiFENesin, loperamide HCl, nitroGLYCERIN, ondansetron (ZOFRAN) IV

## 2021-10-02 NOTE — Progress Notes (Addendum)
STROKE TEAM PROGRESS NOTE   INTERVAL HISTORY No family at the bedside. She is awake and alert upon NP and MD entering the room. Moved to 3W yesterday.  Hemodynamically stable and neurological exam remains stable on exam. RN paged NP due to change in NIH. Patient was taken for a stat Head CT that appears stable. Patient moving LLE less, however she is wincing and states that her foot is hurting. Consider vascular surgery consult.     Vitals:   10/02/21 0845 10/02/21 0930 10/02/21 0939 10/02/21 1152  BP:  140/78 (!) 143/61 110/67  Pulse:  (!) 109 (!) 109 97  Resp:    18  Temp:    98.2 F (36.8 C)  TempSrc:      SpO2: 98% 100%  100%  Weight:      Height:       CBC:  Recent Labs  Lab 09/30/21 0418 10/01/21 0405  WBC 22.5* 19.6*  HGB 7.7* 8.3*  HCT 25.6* 29.5*  MCV 98.8 105.4*  PLT 290 638    Basic Metabolic Panel:  Recent Labs  Lab 09/28/21 0927 09/28/21 1108 09/29/21 0446 10/01/21 0405 10/02/21 0407  NA 134*  --    < > 133* 135  K 3.8  --    < > 5.7* 5.6*  CL 95*  --    < > 94* 92*  CO2 23  --    < > 18* 23  GLUCOSE 154*  --    < > 239* 185*  BUN 70*  --    < > 116* 109*  CREATININE 5.34*  --    < > 6.36* 5.71*  CALCIUM 8.5*  --    < > 8.9 8.9  MG 2.4 2.5*  --   --   --   PHOS 4.5 4.4  --   --   --    < > = values in this interval not displayed.    Lipid Panel:  No results for input(s): CHOL, TRIG, HDL, CHOLHDL, VLDL, LDLCALC in the last 168 hours.  HgbA1c:  No results for input(s): HGBA1C in the last 168 hours.  Urine Drug Screen:  No results for input(s): LABOPIA, COCAINSCRNUR, LABBENZ, AMPHETMU, THCU, LABBARB in the last 168 hours.   Alcohol Level  No results for input(s): ETH in the last 168 hours.  IMAGING past 24 hours CT HEAD WO CONTRAST (5MM)  Result Date: 10/02/2021 CLINICAL DATA:  Stroke patient with sudden change in status. Left-sided deficits EXAM: CT HEAD WITHOUT CONTRAST TECHNIQUE: Contiguous axial images were obtained from the base of the  skull through the vertex without intravenous contrast. RADIATION DOSE REDUCTION: This exam was performed according to the departmental dose-optimization program which includes automated exposure control, adjustment of the mA and/or kV according to patient size and/or use of iterative reconstruction technique. COMPARISON:  09/15/2021 head CT FINDINGS: Brain: Recent right MCA distribution infarct, especially posterior division, also involving the right PCA at the medial occipital and temporal lobes, with high-density cortex from mineralization and or residual blood products. Swelling has resolved. Pre-existing left cerebellar infarct. Pre-existing deep perforator infarcts. No visible acute infarct or acute hematoma. No hydrocephalus or mass. Vascular: No hyperdense vessel Skull: Advanced C1-2 degeneration with subluxation and cervicomedullary impingement. Sinuses/Orbits: Partial right mastoid opacification which may be related to nasal intubation. IMPRESSION: No acute finding. Evolving right cerebral infarcts. Pre-existing perforator and left cerebellar infarcts. Electronically Signed   By: Jorje Guild M.D.   On: 10/02/2021 09:26  PHYSICAL EXAM  Temp:  [97.3 F (36.3 C)-99.6 F (37.6 C)] 98.2 F (36.8 C) (01/14 1152) Pulse Rate:  [96-117] 97 (01/14 1152) Resp:  [18-37] 18 (01/14 1152) BP: (100-143)/(61-86) 110/67 (01/14 1152) SpO2:  [91 %-100 %] 100 % (01/14 1152) Weight:  [68.5 kg-73.4 kg] 68.5 kg (01/14 0100)  General - Alert, well-developed, well-nourished female in no acute distress  Cardiovascular - Regular rate and rhythm. Lower extremity exam shows discolored toes in the left foot which are tender to touch and appear pregangrenous.  Absent distal pulses in the left foot Neurological: Awake alert and interactive. Oriented to person and place.  Eyes are open and follows simple commands. PERRL, tracking bilaterally, no significant gaze palsy but still has left hemianopia, not blinking to  visual threat on the left.  Will move all extremities in response to commands with 5/5 strength in RUE and RLE (right AKA chronic), 3/5 strength in LUE, and LLE 3/5 strength. Sensation and coordination not cooperative, and gait not tested. LLE movement limited by pain.   ASSESSMENT/PLAN Jenna Brown is a 71 y.o. female with history of ESRD on IHD, NSTEMI, CAD, HTN, HLD, right AKA, stroke and T2DM presenting with left sided weakness, left facial droop and slurred speech.  She was brought to the ED.  TNK was not given as she presented outside the window.  CT angiography revealed LVO in the proximal right ICA with perfusion study showing core infarct of 29cc and penumbra of 168cc in right MCA and PCA territories. Mechanical thrombectomy was performed with TICI 3 flow achieved.  Receiving hemodialysis per nephrology. Extubated 1/7 to [facility]. Remains extubated and doing well from a respiratory standpoint.  Cortrak in place for TF and medications. Diet upgraded to dysphagia 1 (pureed). 1/14 stat head ct ordered for a change in neurological status. Repeat head CT stable and patient is back to previous neuro status. Patient stating that she is having pain in her Lt foot. Vascular surgery consult recommended, related to PVD/PAD.   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 Bilateral basal ganglia and left cerbellar chronic infarcts, chronic C1-C2 subluxation. ASPECTS 10.    Stat CT 1/14- Evolving right cerebral infarcts. Pre-existing perforator and left cerebellar infarcts. CTA head & neck emergent LVO at proximal right ICA, isolated right M1 segment and fetal type right PCA, left vertebral artery occlusion, stenotic right vertebral artery, high grade mid basilar stenosis CT perfusion 29/197 Status post IR with TICI3 at right ICA.  MRI  acute infarcts in right MCA and PCA territory,  punctate foci of restricted diffusion in bilateral forntal and parietal lobes as well as left occipital lobe MRA  resolution of right ICA occlusion  2D Echo EF 25-30%, no LV thrombus LDL 74 HgbA1c 5.5 UDS negative VTE prophylaxis -Heparin subcu aspirin 81 mg daily and Brilinta (ticagrelor) 90 mg bid prior to admission, now on eliquis and ASA. Recommend to check TTE in 3 months, if EF > 30-35%, eliquis can be switched back to brilinta.  Therapy recommendations:  SNF Disposition:  pending  Respiratory failure Extubated 1/7 to [facility] Management per CCM Currently tolerating well Consider tracheostomy if reintubated  Elevated WBC Fever WBC 15.6 > 19.1->21.4->28.3->37.3->45.2-> 22.3-> 22.5-> 19.6 T-max 100.1-> afebrile Possible aspiration pneumonia.  Cefepime -> vancomycin and Zosyn->zosyn managed by CCM.  Cardiomyopathy 07/2021 EF 20 to 25% This admission EF 25  to 30% Home meds including Imdur and Coreg Currently not on home meds due to hypotension Management per CCM Was on heparin IV for 48 hours. now on eliquis and ASA. Recommend to check TTE in 3 months, if EF > 30-35%, eliquis can be switched back to brilinta.  Hx of hypertension Hypotension Home meds:  Coreg 3.125 mg BID BP on the low end Off Levophed Long-term BP goal normotensive Continue midodrine 10 TID  Hyperlipidemia Home meds:  atorvastatin 80 mg daily, resumed in hospital LDL 74, goal < 70 Zetia 10 mg daily and atorvastatin 80mg  Continue statin at discharge  Diabetes Hemoglobin A1c 5.5, uncontrolled Hyperglycemia On insulin SSI CBG monitoring Close PCP follow-up for better DM control Diabetic consult.  NSTEMI 09/27/21 Chest pain with significantly elevated troponin Cardiology on board on heparin drip for 48 hours and now on eliquis. switch to DOAC eliquis and continue ASA.  Once EF > 30-35%, can consider switch eliquis back to brilinta per cardiology Continue ASA.  Continue titrating down  midodrine Add metoprolol when bp is stable  Other Stroke Risk Factors Advanced Age >/= 58  Former cigarette smoker Hx stroke Coronary artery disease  Other Active Problems ESRD on IHD - Appreciate nephrology recommendations  Right LE AKA Initial CT showed C1-C2 subluxation with anterior spinal cord compression, similar as prior.  MRI C-spine showed diffuse cervical spine spinal canal stenosis with cord flattening but no cord signal abnormality. Anemia due to ESRD, hemoglobin 8.5 > 7.9 > 7.5->7.6->8.3-> 7.9->7.7->8.3 Hyperkalemia- 5.7-> 5.6 PVD/PAD.  Pregangrenous changes in the left foot- vascular surgery consult  Hospital day # 18  Patient seen and examined by NP/APP with MD. MD to update note as needed.   Janine Ores, DNP, FNP-BC [facility] Pager: [phone]  STROKE MD NOTE : I have personally obtained history,examined this patient, reviewed notes, independently viewed imaging studies, participated in medical decision making and plan of care.ROS completed by me personally and pertinent positives fully documented  I have made any additions or clarifications directly to the above note. Agree with note above.  Patient known to the stroke service was recently signed off and we were called back today for worsening NIH stroke scale.  Patient appears to be in significant pain in the left foot with pregangrenous changes likely due to peripheral arterial disease.  Otherwise neurological exam appears unchanged.  Stat CT scan of the head was done which shows expected evolutionary changes in the large right MCA infarct with some trace hemorrhagic transformation.  Recommend continue ongoing therapy with Eliquis and Brilinta.  Urgent vascular surgery consult.  Patient has overall poor condition and family will need to review goals of care and palliative care may be appropriate.  Discussed with Dr. Tana Coast.  Greater than 50% time during this 50-minute visit was spent in counseling and  coordination of care discussion with care team and answering questions.  Stroke team will sign off.  Kindly call for questions Antony Contras, MD Medical Director [place] Pager: [phone] 10/02/2021 4:59 PM    To contact Stroke Continuity provider, please refer to [email or web address] After hours, contact General Neurology

## 2021-10-02 NOTE — Progress Notes (Signed)
Progress Note   Patient: Jenna Brown:[id number] DOB: 1950-12-06 DOA: 09/14/2021     18 DOS: the patient was seen and examined on 10/02/2021   Brief hospital course: 71 year old woman who presented [facility] with left-sided weakness, left facial droop and slurred speech, found to have acute right ICA stroke, s/p mechanical thrombectomy 12/28. PMHx significant for HTN, HLD, T2DM, CAD, PVD (s/p R AKA), ESRD on HD MWF. Of note, recent admission for COVID-19 infection (treated with monotherapy as an outpatient); she then developed chest pain with concern for possible NSTEMI.  TRH requested to assume care on 1/12, plan to transfer out of ICU.  -stable from neuro standpoint.  Still has dysphagia, on cortrack for tube feeds -Extubated on 1/7, O2 sats 95% on 2 L  Assessment and Plan * Acute ischemic stroke ([place])- (present on admission) - Presented with left-sided weakness, left facial droop and slurred speech.  TN K not given as presented outside the window -CTA head and neck showed LVO in the proximal right ICA, isolated right M1 segment, P2 high right PCA, left vertebral artery occlusion, stenotic right vertebral artery, high-grade mid basilar stenosis.  CT perfusion 29/197. -MRI showed acute infarcts in the right MCA and PCA territory, punctate foci of restricted diffusion in bilateral frontal thyroid lobes as well as left occipital lobe -Status post mechanical thrombectomy with TICI 3 at right ICA -MRA showed resolution of right ICA occlusion -2D echo showed EF 25- 30%, no LV thrombus.  LDL 74.  A1c 5.5 -PTA patient was on aspirin 81 mg daily and Brilinta 90 mg twice daily, was placed on heparin IV, now transitioned to aspirin 81 mg daily, apixaban 5 mg twice daily -Neurology following, recommended to check 2D echo in 3 months, if EF more than 30 to 35%, eliquis can be switched back to Brilinta. -This a.m. noted to have NIH change to 13, left-sided neglect, stat CT head showed evolving  right cerebral infarcts Neurology following, will defer further management to stroke service.   Cellulitis of foot, left- (present on admission) - Patient noted to have erythema, blackish left second and fourth toes, tenderness, concern for infection or ischemia. -ABI still pending, previously in 10/22 had shown noncompressible left lower extremity arteries -MRI of the left foot showed T2 hyperintense signal in the tuft of the first distal phalanx, reactive edema versus osteomyelitis -Vascular surgery consulted, discussed with Dr. Stanford Breed, will evaluate -Procalcitonin 3.03, will place on IV Rocephin   Dysphagia - Serial SLP evaluation, currently has core track and on puree/dysphagia 1 diet -Poor oral intake, placed on continuous tube feeds.  -Palliative medicine consulted for overall goals of care, will need a feeding tube if patient is not able to advance diet.    Klebsiella pneumonia ([place])- (present on admission) - Initially was placed on ceftriaxone, then transition to IV vancomycin and Zosyn  - Completed antibiotics (Zosyn) after 7-day course  Acute respiratory failure with hypoxia ([place])- (present on admission) - In the setting of Klebsiella pneumonia, COVID-19, small left lung pleural effusion  -Completed antibiotics (Zosyn) after 7-day course on 1/11 -Continue aspiration precautions  COVID-19 virus infection- (present on admission) - Asymptomatic  NSTEMI (non-ST elevated myocardial infarction) ([place])- (present on admission) - Severe ischemic cardiomyopathy, NSTEMI in the setting of recent ischemic CVA.  Troponins peaked at 13,028 -2D echo showed EF of 25 to 30%, global hypokinesis, grade 1 DD -Cardiology following, treated with IV heparin for 48 hours, now transitioned to aspirin 81 mg daily with eliquis,  currently held due to concern for small bleed on CT. -Continue statin, Zetia  ESRD (end stage renal disease) on dialysis ([facility]) - On hemodialysis, MWF, nephrology  following -Has anemia of chronic disease, and last increase to 160mcg weekly  Insulin-requiring or dependent type II diabetes mellitus ([place]) - on tube feed coverage -Not well controlled with hyperglycemia, continue Semglee to 23 units BID, increase NovoLog to 6 units every 4 hours  -Hemoglobin A1c 5.5 on 09/15/2021 Recent Labs    09/29/21 1956 09/29/21 2340 09/30/21 0348 09/30/21 0723 09/30/21 1002 09/30/21 1117  GLUCAP 190* 222* 190* 206* 265* 267*    Essential hypertension- (present on admission) - BP currently stable, midodrine was decreased to 5 mg TID, on 1/13,await neuro recommendation regarding stopping midodrine. -Continue Coreg 3.125 mg p.o. BID  Pressure injury of skin- (present on admission) Pressure Injury 09/17/21 Coccyx Medial Deep Tissue Pressure Injury - Purple or maroon localized area of discolored intact skin or blood-filled blister due to damage of underlying soft tissue from pressure and/or shear. (Active)  09/17/21 1610  Location: Coccyx  Location Orientation: Medial  Staging: Deep Tissue Pressure Injury - Purple or maroon localized area of discolored intact skin or blood-filled blister due to damage of underlying soft tissue from pressure and/or shear.  Wound Description (Comments):   Present on Admission:      Pressure Injury 09/28/21 Thigh Posterior;Upper;Left Stage 2 -  Partial thickness loss of dermis presenting as a shallow open injury with a red, pink wound bed without slough. upper/inner-thigh (Active)  09/28/21 0800  Location: Thigh  Location Orientation: Posterior;Upper;Left  Staging: Stage 2 -  Partial thickness loss of dermis presenting as a shallow open injury with a red, pink wound bed without slough.  Wound Description (Comments): upper/inner-thigh  Present on Admission: No       Subjective: Alert and awake, but was noted to have mental status change with the left-sided neglect this a.m.  No fevers or chills.  Left foot  tenderness.  Objective  Vitals:   10/02/21 0939 10/02/21 1152  BP: (!) 143/61 110/67  Pulse: (!) 109 97  Resp:  18  Temp:  98.2 F (36.8 C)  SpO2:  100%    Physical Exam General: Awake, alert, following commands, core track Cardiovascular: S1 S2 clear, RRR. No pedal edema b/l Respiratory: Decreased breath sounds with bilateral scattered wheezing Gastrointestinal: Soft, nontender, nondistended, NBS Ext: right AKA Neuro: LUE 0/5, LLE 3/5, Rt AKA, RUE 5/5, dysarthria   Data Reviewed:  My review of labs, imaging, notes and other tests shows no new significant findings.  CT-scan of the brain: 1/14 showed no acute findings on radiology read, concern for small bleed  Family Communication: No family at the bedside, updated daughter, Dionne Milo on phone # [phone]    Disposition: Status is: Inpatient  Remains inpatient appropriate because: Work-up in progress    Time spent: 35 minutes  Author: Estill Cotta, MD 10/02/2021 11:53 AM  For on call review [email or web address].

## 2021-10-03 DIAGNOSIS — Z515 Encounter for palliative care: Secondary | ICD-10-CM

## 2021-10-03 LAB — GLUCOSE, CAPILLARY
Glucose-Capillary: 188 mg/dL — ABNORMAL HIGH (ref 70–99)
Glucose-Capillary: 150 mg/dL — ABNORMAL HIGH (ref 70–99)

## 2021-10-03 LAB — BASIC METABOLIC PANEL
Anion gap: 19 — ABNORMAL HIGH (ref 5–15)
BUN: 157 mg/dL — ABNORMAL HIGH (ref 8–23)
CO2: 21 mmol/L — ABNORMAL LOW (ref 22–32)
Calcium: 9 mg/dL (ref 8.9–10.3)
Chloride: 94 mmol/L — ABNORMAL LOW (ref 98–111)
Creatinine, Ser: 7.14 mg/dL — ABNORMAL HIGH (ref 0.44–1.00)
GFR, Estimated: 6 mL/min — ABNORMAL LOW (ref >60)
Glucose, Bld: 187 mg/dL — ABNORMAL HIGH (ref 70–99)
Potassium: 4.9 mmol/L (ref 3.5–5.1)
Sodium: 134 mmol/L — ABNORMAL LOW (ref 135–145)

## 2021-10-03 MED ORDER — HALOPERIDOL 0.5 MG PO TABS: 0.5000 mg | ORAL_TABLET | ORAL | Status: DC | PRN

## 2021-10-03 MED ORDER — ONDANSETRON 4 MG PO TBDP: 4.0000 mg | ORAL_TABLET | Freq: Four times a day (QID) | ORAL | Status: DC | PRN

## 2021-10-03 MED ORDER — ONDANSETRON HCL 4 MG/2ML IJ SOLN: 4.0000 mg | Freq: Four times a day (QID) | INTRAMUSCULAR | Status: DC | PRN

## 2021-10-03 MED ORDER — LORAZEPAM 2 MG/ML IJ SOLN: 0.5000 mg | INTRAMUSCULAR | Status: DC | PRN

## 2021-10-03 MED ORDER — HALOPERIDOL LACTATE 2 MG/ML PO CONC: 0.5000 mg | ORAL | Status: DC | PRN

## 2021-10-03 MED ORDER — POLYVINYL ALCOHOL 1.4 % OP SOLN
1.0000 [drp] | Freq: Four times a day (QID) | OPHTHALMIC | Status: DC | PRN
  Filled 2021-10-03: qty 15

## 2021-10-03 MED ORDER — GLYCOPYRROLATE 0.2 MG/ML IJ SOLN: 0.2000 mg | INTRAMUSCULAR | Status: DC | PRN

## 2021-10-03 MED ORDER — HYDROMORPHONE HCL 1 MG/ML IJ SOLN
0.5000 mg | INTRAMUSCULAR | Status: DC | PRN
  Administered 2021-10-03 (×2): 1 mg via INTRAVENOUS
  Administered 2021-10-04 (×3): 0.5 mg via INTRAVENOUS
  Filled 2021-10-03 (×5): qty 1

## 2021-10-03 MED ORDER — IPRATROPIUM-ALBUTEROL 0.5-2.5 (3) MG/3ML IN SOLN: 3.0000 mL | Freq: Four times a day (QID) | RESPIRATORY_TRACT | Status: DC | PRN

## 2021-10-03 MED ORDER — GLYCOPYRROLATE 1 MG PO TABS
1.0000 mg | ORAL_TABLET | ORAL | Status: DC | PRN
  Filled 2021-10-03: qty 1

## 2021-10-03 MED ORDER — HALOPERIDOL LACTATE 5 MG/ML IJ SOLN: 0.5000 mg | INTRAMUSCULAR | Status: DC | PRN

## 2021-10-03 MED ORDER — BIOTENE DRY MOUTH MT LIQD: 15.0000 mL | OROMUCOSAL | Status: DC | PRN

## 2021-10-03 NOTE — Progress Notes (Signed)
Progress Note   Patient: Jenna Brown:[id number] DOB: [birth year] DOA: 09/14/2021     19 DOS: the patient was seen and examined on 10/03/2021   Brief hospital course: 71 year old woman who presented to [facility] 12/27 with left-sided weakness, left facial droop and slurred speech, found to have acute right ICA stroke, s/p mechanical thrombectomy 12/28. PMHx significant for HTN, HLD, T2DM, CAD, PVD (s/p R AKA), ESRD on HD MWF. Of note, recent admission for COVID-19 infection (treated with monotherapy as an outpatient); she then developed chest pain with concern for possible NSTEMI.  TRH requested to assume care on 1/12, plan to transfer out of ICU.  -stable from neuro standpoint.  Still has dysphagia, on cortrack for tube feeds -Extubated on 1/7, O2 sats 95% on 2 L 1/14: Worsening NIH scale, repeat CT head.  Left foot toes gangrene concerning for infectious, ischemia.  Vascular surgery consulted. 1/15: Goals of care addressed, complete comfort care status  Assessment and Plan * Acute ischemic stroke ([place])- (present on admission) - Presented with left-sided weakness, left facial droop and slurred speech.  TN K not given as presented outside the window -CTA head and neck showed LVO in the proximal right ICA, isolated right M1 segment, P2 high right PCA, left vertebral artery occlusion, stenotic right vertebral artery, high-grade mid basilar stenosis.  CT perfusion 29/197. -MRI showed acute infarcts in the right MCA and PCA territory, punctate foci of restricted diffusion in bilateral frontal thyroid lobes as well as left occipital lobe -Status post mechanical thrombectomy with TICI 3 at right ICA -MRA showed resolution of right ICA occlusion -2D echo showed EF 25- 30%, no LV thrombus.  LDL 74.  A1c 5.5 -PTA patient was on aspirin 81 mg daily and Brilinta 90 mg twice daily, was placed on heparin IV -Then transition to aspirin and apixaban -Overall poor prognosis, continues to have left-sided  weakness, dysarthria, dysphagia, unable to advance diet.  Goals of care addressed, now full comfort care. -Appreciate palliative medicine assistance.  Cellulitis of foot, left- (present on admission) - Patient noted to have erythema, blackish left second and fourth toes, tenderness, concern for infection and ischemia. -ABI still pending, previously in 10/22 had shown noncompressible left lower extremity arteries -MRI of the left foot showed T2 hyperintense signal in the tuft of the first distal phalanx, reactive edema versus osteomyelitis -Appreciate vascular surgery recommendations, recommended definitive therapy would be AKA, for now continue wound care and palliative support. -Goals of care addressed, now comfort care status  Dysphagia - Serial SLP evaluation, currently has core track and on puree/dysphagia 1 diet, unable to advance diet, residual dysphagia from the stroke -Goals of care addressed, now comfort care  Klebsiella pneumonia ([place])- (present on admission) - Initially was placed on ceftriaxone, then transition to IV vancomycin and Zosyn  - Completed antibiotics (Zosyn) after 7-day course -Now comfort care  Acute respiratory failure with hypoxia ([place])- (present on admission) - In the setting of Klebsiella pneumonia, COVID-19, small left lung pleural effusion  -Completed antibiotics (Zosyn) after 7-day course on 1/11   COVID-19 virus infection- (present on admission) - Asymptomatic  NSTEMI (non-ST elevated myocardial infarction) ([place])- (present on admission) - Severe ischemic cardiomyopathy, NSTEMI in the setting of recent ischemic CVA.  Troponins peaked at 13,028 -2D echo showed EF of 25 to 30%, global hypokinesis, grade 1 DD -Cardiology following, treated with IV heparin for 48 hours, now transitioned to aspirin 81 mg daily with eliquis, currently held due to concern for small bleed on  CT. -Continue statin, Zetia -Goals of care addressed, overall poor prognosis, now  comfort care  ESRD (end stage renal disease) on dialysis ([place]) - Hemodialysis discontinued in the light of complete comfort care status.  Overall poor prognosis.  Insulin-requiring or dependent type II diabetes mellitus ([place]) - Patient was placed on insulin regimen on tube feeds -Now comfort feeds  Essential hypertension- (present on admission) - Now comfort care  Pressure injury of skin- (present on admission) Medial deep tissue coccyx pressure injury, present on admission Left upper thigh stage II, not POA -Wound care per nursing     Subjective: Alert and awake, continues to have left toes gangrene, left-sided weakness, core track, and unable to advance diet  Objective  Vitals:   10/03/21 0406 10/03/21 0846  BP: 129/72 126/66  Pulse: (!) 106 97  Resp: 20 20  Temp: 98.3 F (36.8 C) 98.8 F (37.1 C)  SpO2: 100% 99%     Physical Exam General: awake, frail and ill-appearing, core track, dysarthria Cardiovascular: S1 S2 clear, RRR. Respiratory: CTA anteriorly Gastrointestinal: Soft, nontender, nondistended, NBS Ext: right AKA, Neuro: left-sided weakness, LUE 0/5, LLE 3/5   Data Reviewed:  There are no new results to review at this time.  Family Communication: Goals of care addressed with family today, complete comfort care status  Disposition: Status is: Inpatient  Remains inpatient appropriate because: Comfort care, will transfer to palliative floor   Time spent: 35 minutes  Author: Estill Cotta, MD 10/03/2021 11:50 AM  For on call review [email or web address].

## 2021-10-03 NOTE — Progress Notes (Addendum)
Manufacturing engineer [facility])  Referral received for [employer] [facility] for EOL care.  Spoke with dtr Joseph Art, she is in [place] and will be in town tonight.   She confirms desire for [facility] but would not want to move her prior to her arriving.  Pending eligibility, [facility] will update once approval has been determined by our MD.   Venia Carbon RN, BSN [facility] Liaison   **addendum, 520 pm, pt is eligible for residential hospice at [facility]

## 2021-10-03 NOTE — Consult Note (Signed)
Palliative Medicine Inpatient Consult Note  Consulting Provider: Mendel Corning, MD  Reason for consult:   [place] Palliative Medicine Consult  Reason for Consult? prolonged hospitalization, admitted on 12/27 with acute stroke, ICA occlusion s/p thrombectomy, course complicated by sepsis, vent associated pneumonia, dysphagia, not tolerating diet, needs GOC   HPI:  Per intake H&P --> 71 year old woman w/ PMHx significant for HTN, HLD, T2DM, CAD, PVD (s/p R AKA), ESRD on HD MWF.  Presented to [facility] 12/27 with left-sided weakness, left facial droop and slurred speech, found to have acute right ICA stroke, s/p mechanical thrombectomy 12/28 - intubated and had prolonged ICU stay. Extubated 1/7. Has dysphagia, on cortrack for tube feeds. Has had complicated LLE gangrene though is a very poor candidate for additional interventions.   Palliative care has been asked to get involved in the setting of a prolonged (eighteen day) hospitalization with associated complications to further address goals of care.  Patient was seen in 2019 by inpatient Palliative care and at that time she wanted all interventions pursued to prolong life.  Clinical Assessment/Goals of Care:  *Please note that this is a verbal dictation therefore any spelling or grammatical errors are due to the "[employer]" system interpretation.  I have reviewed medical records including EPIC notes, labs and imaging, received report from bedside RN, assessed the patient who is able to open her eyes and tell me who she is and where she is but beyond that looks to the left and drifts off into a more somnolent state.    I called patient's daughter, Joseph Art to further discuss diagnosis prognosis, GOC, EOL wishes, disposition and options.   I introduced Palliative Medicine as specialized medical care for people living with serious illness. It focuses on providing relief from the symptoms and stress of a serious  illness. The goal is to improve quality of life for both the patient and the family.  Medical History Review and Understanding:  A comprehensive medical review was held with Renee inclusive of Carolynne's history of end-stage renal disease, peripheral vascular disease resulting in a right above-the-knee amputation type 2 diabetes, coronary artery disease, and stroke.  Discussed that Marletta is having a variety of complications in the setting of her prolonged hospitalization presently.  Reviewed what a trial some time it has been for her in the setting of her stroke her ongoing dysphagia, her peripheral vascular disease.  I shared concern that even if Shanikia were operated on her course in time on earth would be very short which may make she and her family think twice about how her final day should be spent.  Renee talked for a prolonged amount of time about how difficult this last year has been as she lost both her son and godson in a tragic car accident.  She shares that she herself has struggled with the overwhelming emotions and grief associated with all of that.  We spent time to talk about the difficulty she is endured.  A great amount of therapeutic communication was provided.  I recommended strongly that Renee consider additional grief counseling in the future given how this is affecting her.  Functional and Nutritional State:  Patient has been poorly functional for quite some time and suffers from multiple sacral decubitus injuries.  She has a right above-the-knee amputation.  She is total care patient.  Certainly in the setting of her acute stroke her nutritional state presently is quite impaired and unclear whether or not she will able  to regain that function.   Advance Directives: A detailed discussion was had today regarding advanced directives -patient's daughter, Joseph Art is her primary decision maker though we do not have formal advanced directives.  Code Status: DNAR/DNI code status.    Plan for end of life care.  We talked about transition to comfort measures in house and what that would entail inclusive of medications to control pain, dyspnea, agitation, nausea, itching, and hiccups.    We discussed stopping all uneccessary measures such as cardiac monitoring, blood draws, needle sticks, and frequent vital signs.   Utilized reflective listening throughout our time together.   Goals for the Future:  To have a comfortable passing from this earth.  Decision Maker: Dionne Milo (daughter) [phone]  SUMMARY OF RECOMMENDATIONS   DNAR/DNI  Comfort focused care - Comfort medications per MAR  DC NGT  DC Tele  Unrestricted Visitation  TOC - Transition to IP hospice  Ongoing symptom support  Code Status/Advance Care Planning: DNAR/DNI  Palliative Prophylaxis:  Aspiration, Bowel Regimen, Delirium Protocol, Frequent Pain Assessment, Oral Care, Palliative Wound Care, and Turn Reposition  Additional Recommendations (Limitations, Scope, Preferences): Avoid Hospitalization, Full Scope Treatment, No Artificial Feeding, No Surgical Procedures, and No Tracheostomy  Psycho-social/Spiritual:  Desire for further Chaplaincy support: Yes Additional Recommendations: Education on chronic disease burden   Prognosis:  < 2 weeks  Discharge Planning: Likely to IP hospice  Vitals:   10/02/21 2344 10/03/21 0406  BP: 128/69 129/72  Pulse: (!) 102 (!) 106  Resp: 20 20  Temp: 98.2 F (36.8 C) 98.3 F (36.8 C)  SpO2: 97% 100%   No intake or output data in the 24 hours ending 10/03/21 0651 Last Weight  Most recent update: 10/03/2021  4:48 AM    Weight  71.4 kg (157 lb 6.5 oz)            Gen: Extremely frail elderly African-American female appears older than age listed HEENT: Core track in place, dry mucous membranes CV: Irregular rate and rhythm  PULM:  On RA ABD: soft/nontender  EXT: (+) LLE edema  Neuro: Alert and oriented  to person, sometimes  place  PPS: 10%   This conversation/these recommendations were discussed with patient primary care team, Dr. Tana Coast  Total Time: 120  MDM High  Medical Decision Making:4 #/Complex Problems:4                      Data Reviewed:4                 Management:4 (1-Straightforward, 2-Low, 3-Moderate, 4-High) ______________________________________________________ [place] Team Team Cell Phone: [phone] Please utilize secure chat with additional questions, if there is no response within 30 minutes please call the above phone number  Palliative Medicine Team providers are available by phone from 7am to 7pm daily and can be reached through the team cell phone.  Should this patient require assistance outside of these hours, please call the patient's attending physician.

## 2021-10-04 MED ORDER — POLYVINYL ALCOHOL 1.4 % OP SOLN: 1.0000 [drp] | Freq: Four times a day (QID) | OPHTHALMIC | 0 refills | Status: AC | PRN

## 2021-10-04 MED ORDER — ACETAMINOPHEN 325 MG PO TABS
650.0000 mg | ORAL_TABLET | ORAL | Status: AC | PRN
Start: 2021-10-04 — End: ?

## 2021-10-04 MED ORDER — GLYCOPYRROLATE 1 MG PO TABS: 1.0000 mg | ORAL_TABLET | ORAL | Status: AC | PRN

## 2021-10-04 MED ORDER — ONDANSETRON 4 MG PO TBDP
4.0000 mg | ORAL_TABLET | Freq: Four times a day (QID) | ORAL | 0 refills | Status: AC | PRN
Start: 2021-10-04 — End: ?

## 2021-10-04 NOTE — Consult Note (Signed)
[place] Nurse wound follow up Patient discharging to [facility]. [place] team will sign off at this time but are available to this patient and the medical team if needed. Please re-consult if needed.  Cathlean Marseilles Tamala Julian, MSN, RN, [place], Lysle Pearl, [facility] Wound Treatment Associate Pager [phone]

## 2021-10-04 NOTE — Progress Notes (Signed)
Pt's chart reviewed and d/c plans for hospice home noted. Contacted [facility] and spoke to [facility] to make clinic aware pt has been transitioned to comfort care and to d/c to hospice home today.   Melven Sartorius Renal Navigator [phone]

## 2021-10-04 NOTE — Progress Notes (Signed)
[facility]AR here to transport patient to [facility].

## 2021-10-04 NOTE — Progress Notes (Signed)
[facility] Hospital Liaison Note  Consent forms have been completed.  PTAR notified of patient D/C and transport arranged by TOC. IDT notified of transport arrangement.    Please send signed DNR form with patient and RN call report to [phone].    Daphene Calamity, MSW [facility] Liaison [phone]

## 2021-10-04 NOTE — Discharge Summary (Signed)
Physician Discharge Summary   Patient: NOU CHARD MRN: [id number] DOB: [birth year]  Admit date:     09/14/2021  Discharge date: 10/04/21  Discharge Physician: Estill Cotta   PCP: [name], [place]   Recommendations at discharge:  Complete comfort care status  Discharge Diagnoses    Acute ischemic stroke [facility])   NSTEMI (non-ST elevated myocardial infarction) ([place])   COVID-19 virus infection   Acute respiratory failure with hypoxia ([place])   Klebsiella pneumonia ([place])   Dysphagia   Cellulitis of foot, left   Essential hypertension   Insulin-requiring or dependent type II diabetes mellitus ([place])   ESRD (end stage renal disease) on dialysis ([place])   Pressure injury of skin    Hospital Course   Patient is a 71 year old female who [facility] to [facility]/27 with left-sided weakness, left facial droop and slurred speech, found to have acute right ICA stroke, s/p mechanical thrombectomy 12/28. PMHx significant for HTN, HLD, T2DM, CAD, PVD (s/p R AKA), ESRD on HD MWF. Of note, recent admission for COVID-19 infection (treated with monotherapy as an outpatient); she then developed chest pain with concern for possible NSTEMI. TRH requested to assume care on 1/12, Continue to have dysphagia, was on core track for tube feeds.  Extubated on 1/7, O2 sats 95% on 2 L 1/14: Worsening NIH scale, repeated CT head.  Left foot toes gangrene concerning for infectious, ischemia.  Vascular surgery was consulted. 1/15: Goals of care addressed, complete comfort care status  * Acute ischemic stroke ([place])- (present on admission) - Presented with left-sided weakness, left facial droop and slurred speech.  TN K not given as presented outside the window -CTA head and neck showed LVO in the proximal right ICA, isolated right M1 segment, P2 high right PCA, left vertebral artery occlusion, stenotic right vertebral artery, high-grade mid basilar stenosis.  CT perfusion 29/197. -MRI showed acute infarcts in the  right MCA and PCA territory, punctate foci of restricted diffusion in bilateral frontal thyroid lobes as well as left occipital lobe -Status post mechanical thrombectomy with TICI 3 at right ICA -MRA showed resolution of right ICA occlusion -2D echo showed EF 25- 30%, no LV thrombus.  LDL 74.  A1c 5.5 -PTA patient was on aspirin 81 mg daily and Brilinta 90 mg twice daily, was placed on heparin IV, then transitioned to aspirin and apixaban -Overall poor prognosis, continues to have left-sided weakness, dysarthria, dysphagia, unable to advance diet.  Palliative medicine was consulted and goals of care were addressed, now full comfort care.   Cellulitis of foot, left- (present on admission) - Patient noted to have erythema, blackish left second and fourth toes, tenderness, concern for infection and ischemia. -ABI still pending, previously in 10/22 had shown noncompressible left lower extremity arteries -MRI of the left foot showed T2 hyperintense signal in the tuft of the first distal phalanx, reactive edema versus osteomyelitis -Patient was evaluated by vascular surgery, Dr. Stanford Breed  recommended definitive therapy would be AKA, for now continue wound care and palliative support. -Goals of care addressed, now comfort care status  Dysphagia - Serial SLP evaluation, currently has core track and on puree/dysphagia 1 diet, unable to advance diet, residual dysphagia from the stroke -Goals of care addressed, now comfort care  Klebsiella pneumonia ([place])- (present on admission) - Initially was placed on ceftriaxone, then transition to IV vancomycin and Zosyn  - Completed antibiotics (Zosyn) after 7-day course -Now comfort care  Acute respiratory failure with hypoxia ([place])- (present on admission) - In the  setting of Klebsiella pneumonia, COVID-19, small left lung pleural effusion  -Completed antibiotics (Zosyn) after 7-day course on 1/11   COVID-19 virus infection- (present on admission) -  Asymptomatic  NSTEMI (non-ST elevated myocardial infarction) ([place])- (present on admission) - Severe ischemic cardiomyopathy, NSTEMI in the setting of recent ischemic CVA.  Troponins peaked at 13,028 -2D echo showed EF of 25 to 30%, global hypokinesis, grade 1 DD -Cardiology was consulted, treated with IV heparin for 48 hours, then transitioned to aspirin 81 mg daily with eliquis, statin, Zetia -Goals of care addressed, overall poor prognosis, now comfort care  ESRD (end stage renal disease) on dialysis ([place]) - Hemodialysis discontinued in the light of complete comfort care status.  Overall poor prognosis.  Insulin-requiring or dependent type II diabetes mellitus ([place]) - Patient was placed on insulin regimen on tube feeds -Now comfort feeds  Essential hypertension- (present on admission) - Now comfort care  Pressure injury of skin- (present on admission) Medial deep tissue coccyx pressure injury, present on admission Left upper thigh stage II, not POA -Wound care per nursing        Consultants:  Pulmonary critical care Neurology Vascular surgery Nephrology  Procedures performed:  Hemodialysis Disposition: Hospice care Diet recommendation: Comfort feeding  DISCHARGE MEDICATION: Allergies as of 10/04/2021       Reactions   Plavix [clopidogrel Bisulfate] Itching   Codeine Other (See Comments)   "makes me feel strange"   Hydrocodone Other (See Comments)   Feels disorinated   Losartan Other (See Comments)   Tylenol With Codeine #3 [acetaminophen-codeine] Other (See Comments)   "doesn't make me feel right"        Medication List     STOP taking these medications    aspirin 81 MG chewable tablet   atorvastatin 80 MG tablet Commonly known as: LIPITOR   Basaglar KwikPen 100 UNIT/ML   carvedilol 3.125 MG tablet Commonly known as: COREG   cholecalciferol 25 MCG (1000 UNIT) tablet Commonly known as: VITAMIN D   docusate sodium 100 MG capsule Commonly known  as: COLACE   hydrALAZINE 10 MG tablet Commonly known as: APRESOLINE   isosorbide mononitrate 30 MG 24 hr tablet Commonly known as: IMDUR   nitroGLYCERIN 0.4 MG SL tablet Commonly known as: NITROSTAT   oxyCODONE-acetaminophen 5-325 MG tablet Commonly known as: PERCOCET/ROXICET   QUEtiapine 50 MG tablet Commonly known as: SEROQUEL   Renvela 800 MG tablet Generic drug: sevelamer carbonate   ticagrelor 90 MG Tabs tablet Commonly known as: Brilinta   zinc sulfate 220 (50 Zn) MG capsule       TAKE these medications    acetaminophen 325 MG tablet Commonly known as: TYLENOL Take 2 tablets (650 mg total) by mouth every 4 (four) hours as needed for mild pain (or temp > 37.5 C (99.5 F)). What changed:  medication strength how much to take when to take this reasons to take this   glycopyrrolate 1 MG tablet Commonly known as: ROBINUL Take 1 tablet (1 mg total) by mouth every 4 (four) hours as needed (excessive secretions).   ondansetron 4 MG disintegrating tablet Commonly known as: ZOFRAN-ODT Take 1 tablet (4 mg total) by mouth every 6 (six) hours as needed for nausea.   polyvinyl alcohol 1.4 % ophthalmic solution Commonly known as: LIQUIFILM TEARS Place 1 drop into both eyes 4 (four) times daily as needed for dry eyes.               Discharge Care Instructions  (From admission,  onward)           Start     Ordered   10/04/21 0000  Change dressing (specify)       Comments: PRN   10/04/21 1219            Follow-up Information     [name], [place]. Schedule an appointment as soon as possible for a visit.   Specialty: Internal Medicine Why: as needed Contact information: [place] [address] [phone]                 Discharge Exam: Filed Weights   10/01/21 1540 10/02/21 0100 10/03/21 0447  Weight: 73.4 kg 68.5 kg 71.4 kg   S: Alert and awake, no acute distress.  Appears to be comfortable  Vitals:    10/03/21 2046 10/04/21 0735  BP: 118/65 123/66  Pulse: 94 90  Resp: 20 18  Temp: (!) 97.4 F (36.3 C) 98 F (36.7 C)  SpO2: 100% 100%    Physical Exam General: Alert, frail and ill-appearing, dysarthria Cardiovascular: S1 S2 clear, RRR.  Respiratory: CTAB, no wheezing, rales or rhonchi Gastrointestinal: Soft, nontender, nondistended, NBS Ext: right AKA, left-sided weakness, gangrene of the left toes Neuro: right upper extremity 0/5, left lower extremity 3/5, RUE, RLE LUE 5/5    Condition at discharge: poor  The results of significant diagnostics from this hospitalization (including imaging, microbiology, ancillary and laboratory) are listed below for reference.   Imaging Studies: CT HEAD WO CONTRAST (5MM)  Result Date: 10/02/2021 CLINICAL DATA:  Stroke patient with sudden change in status. Left-sided deficits EXAM: CT HEAD WITHOUT CONTRAST TECHNIQUE: Contiguous axial images were obtained from the base of the skull through the vertex without intravenous contrast. RADIATION DOSE REDUCTION: This exam was performed according to the departmental dose-optimization program which includes automated exposure control, adjustment of the mA and/or kV according to patient size and/or use of iterative reconstruction technique. COMPARISON:  09/15/2021 head CT FINDINGS: Brain: Recent right MCA distribution infarct, especially posterior division, also involving the right PCA at the medial occipital and temporal lobes, with high-density cortex from mineralization and or residual blood products. Swelling has resolved. Pre-existing left cerebellar infarct. Pre-existing deep perforator infarcts. No visible acute infarct or acute hematoma. No hydrocephalus or mass. Vascular: No hyperdense vessel Skull: Advanced C1-2 degeneration with subluxation and cervicomedullary impingement. Sinuses/Orbits: Partial right mastoid opacification which may be related to nasal intubation. IMPRESSION: No acute finding. Evolving  right cerebral infarcts. Pre-existing perforator and left cerebellar infarcts. Electronically Signed   By: Jorje Guild M.D.   On: 10/02/2021 09:26   CT HEAD WO CONTRAST  Result Date: 09/15/2021 CLINICAL DATA:  Stroke follow-up EXAM: CT HEAD WITHOUT CONTRAST TECHNIQUE: Contiguous axial images were obtained from the base of the skull through the vertex without intravenous contrast. COMPARISON:  None. FINDINGS: Brain: Mild hyperattenuation within the posterior right MCA territory and right PCA territory. No midline shift or other mass effect. There is periventricular hypoattenuation compatible with chronic microvascular disease. Old left cerebellar infarct. Small amount of subarachnoid blood over the posterior right hemisphere Vascular: Atherosclerotic calcification of the internal carotid arteries at the skull base. No abnormal hyperdensity of the major intracranial arteries or dural venous sinuses. Skull: The visualized skull base, calvarium and extracranial soft tissues are normal. Sinuses/Orbits: No fluid levels or advanced mucosal thickening of the visualized paranasal sinuses. No mastoid or middle ear effusion. The orbits are normal. IMPRESSION: 1. Mild hyperattenuation within the posterior right MCA territory and  right PCA territory, likely contrast material. 2. Small amount of subarachnoid hyperdensity over the posterior right hemisphere is favored to be extravasated contrast rather than subarachnoid blood. 3. Old left cerebellar infarct. Electronically Signed   By: Ulyses Jarred M.D.   On: 09/15/2021 00:26   CT CHEST WO CONTRAST  Result Date: 09/22/2021 CLINICAL DATA:  Pleural effusions EXAM: CT CHEST WITHOUT CONTRAST TECHNIQUE: Multidetector CT imaging of the chest was performed following the standard protocol without IV contrast. COMPARISON:  Chest radiograph most recently dated 1 day prior FINDINGS: Lines/tubes: The endotracheal tube tip is in the midthoracic trachea. The enteric catheter tip is  off the field of view. Cardiovascular: The heart is enlarged. There is no significant pericardial effusion. Extensive coronary artery calcifications and moderate aortic valve calcifications are noted. The main pulmonary artery is borderline enlarged. There is mild calcified atherosclerotic plaque of the aortic arch. Mediastinum/Nodes: The thyroid is unremarkable. The esophagus is grossly unremarkable. There is no mediastinal or axillary lymphadenopathy. There is no bulky hilar lymphadenopathy, within the confines of noncontrast technique. Lungs/Pleura: Trachea and central airways are patent. There are small bilateral pleural effusions. There is consolidation throughout much of the bilateral lower lobes with air bronchograms. The upper lobes and right middle lobe are well aerated. There is no pneumothorax. Upper Abdomen: Hyperdensity in the gallbladder is incompletely imaged and may reflect sludge, stones, vicarious excretion of contrast, or combination there of Musculoskeletal: There is no acute osseous abnormality or aggressive osseous lesion. IMPRESSION: 1. Small bilateral pleural effusions with consolidation throughout much of the bilateral lower lobes which may reflect atelectasis, though pneumonia can not be excluded. 2. Cardiomegaly with extensive coronary artery calcifications and moderate aortic valve calcification. 3. Borderline enlarged main pulmonary artery which can be seen with pulmonary hypertension. Aortic Atherosclerosis (ICD10-I70.0). Electronically Signed   By: Valetta Mole M.D.   On: 09/22/2021 12:56   MR ANGIO HEAD WO CONTRAST  Result Date: 09/15/2021 CLINICAL DATA:  Stroke, follow-up EXAM: MRI HEAD WITHOUT CONTRAST MRA HEAD WITHOUT CONTRAST TECHNIQUE: Multiplanar, multi-echo pulse sequences of the brain and surrounding structures were acquired without intravenous contrast. Angiographic images of the Circle of Willis were acquired using MRA technique without intravenous contrast. COMPARISON:   No prior MRI, correlation is made with CT head 09/15/2021 and CT head and CTA head and neck 09/14/2021. FINDINGS: MRI HEAD FINDINGS Brain: Increased signal on diffusion-weighted imaging with ADC correlate in the right MCA and PCA territory, particularly in the right temporal lobe, anterior to posterior, right hippocampus and medial temporal lobe, right occipital lobe, inferior right parietal lobe, right thalamus, and right basal ganglia, consistent with the known right MCA and PCA territory infarct; these areas also demonstrate increased T2 hyperintense signal and gyral swelling, consistent with edema. Increased signal on susceptibility weighted imaging is seen most prominently in the right temporal lobe (series 9, image 36), as well as smaller areas in the right occipital lobe (series 9, image 48), right parietal lobe (series 9, image 51), and right basal ganglia (series 9, image 49). Additional punctate foci of restricted diffusion with ADC correlates in the left frontal (series 3, image 39), left parietal (series 3, image 38), left occipital (series 3, images 19 and 20), right frontal (series 3, image 33 and 34), and more superior right parietal lobe (series 3, image 32). Mild local mass effect in the right temporal lobe without significant midline shift. T2 hyperintense signal in the periventricular white matter, likely the sequela of chronic small vessel ischemic disease. Remote  left cerebellar and bilateral basal ganglia infarcts. Vascular: Please see MRA findings below. Skull and upper cervical spine: Normal marrow signal. Redemonstrated advanced C1-C2 degenerative changes, which cause severe spinal canal stenosis. Sinuses/Orbits: Mucosal thickening in the right-greater-than-left maxillary sinus and left sphenoid sinus. Air-fluid level in the right sphenoid sinus. Mucosal thickening in the ethmoid air cells. The orbits are unremarkable. Other: Fluid in the left-greater-than-right mastoid air cells. MRA HEAD  FINDINGS Anterior circulation: Both internal carotid arteries are patent to the termini, with patency of the right ICA new compared to the prior CTA. Moderate focal stenosis in the proximal right cavernous carotid, secondary to calcified plaques. Left A1 patent. Aplastic right A1. Normal anterior communicating artery. Moderate to severe narrowing in the left A2 (series 4, image 119). Anterior cerebral arteries are otherwise patent to their distal aspects. Mild left M1 stenosis. No right M1 stenosis or occlusion. Normal MCA bifurcations. Distal MCA branches perfused and symmetric. Posterior circulation: Proximal left V4 is not visualized, with reconstitution after the takeoff of the left PICA. The right V4 is patent. Basilar patent to its distal aspect, although there is mild-to-moderate narrowing after the takeoff of the AICAs. Patent right superior cerebellar artery, originating from the basilar. The left superior cerebellar artery is significantly narrowed at its origin and is poorly visualized, and is likely partially supplied by the left posterior communicating artery and left P1. The left proximal P1 is hypoplastic, with a suspected common origin with the left SCA. The majority of flow is supplied by the left posterior communicating artery. Mild narrowing in the proximal left P2. Left PCA is otherwise patent. The right PCA originates from the basilar artery and a somewhat diminutive right posterior communicating artery. The distal right PCA segments are patent. Anatomic variants: None significant IMPRESSION: MRI FINDINGS: 1. Acute infarcts in the right MCA and PCA territory, most prominent in the right temporal lobe and right occipital lobe, with involvement of the right inferior parietal lobe, right thalamus and right basal ganglia. These areas also demonstrate gyral swelling and susceptibility, concerning for hemorrhage, most prominent in the right temporal lobe. No mass effect or significant midline shift.  2. Additional punctate foci of restricted diffusion in the bilateral frontal and parietal lobes, as well as the left occipital lobe, likely embolic. MRA FINDINGS: 1. Resolution of previously noted occlusion of the right ICA, status post thrombectomy. Moderate focal stenosis in the proximal right cavernous carotid, secondary to calcification. 2. Moderate to severe narrowing in the left A2. 3. Poor visualization of the left V4; reconstitution after the takeoff of the left PICA may be retrograde. 4. Significant narrowing of the origin of the left superior cerebellar artery, which is poorly visualized. This is likely partially supplied by the left posterior communicating artery and left P1. Electronically Signed   By: Merilyn Baba M.D.   On: 09/15/2021 13:25   MR BRAIN WO CONTRAST  Result Date: 09/15/2021 CLINICAL DATA:  Stroke, follow-up EXAM: MRI HEAD WITHOUT CONTRAST MRA HEAD WITHOUT CONTRAST TECHNIQUE: Multiplanar, multi-echo pulse sequences of the brain and surrounding structures were acquired without intravenous contrast. Angiographic images of the Circle of Willis were acquired using MRA technique without intravenous contrast. COMPARISON:  No prior MRI, correlation is made with CT head 09/15/2021 and CT head and CTA head and neck 09/14/2021. FINDINGS: MRI HEAD FINDINGS Brain: Increased signal on diffusion-weighted imaging with ADC correlate in the right MCA and PCA territory, particularly in the right temporal lobe, anterior to posterior, right hippocampus and medial temporal lobe,  right occipital lobe, inferior right parietal lobe, right thalamus, and right basal ganglia, consistent with the known right MCA and PCA territory infarct; these areas also demonstrate increased T2 hyperintense signal and gyral swelling, consistent with edema. Increased signal on susceptibility weighted imaging is seen most prominently in the right temporal lobe (series 9, image 36), as well as smaller areas in the right  occipital lobe (series 9, image 48), right parietal lobe (series 9, image 51), and right basal ganglia (series 9, image 49). Additional punctate foci of restricted diffusion with ADC correlates in the left frontal (series 3, image 39), left parietal (series 3, image 38), left occipital (series 3, images 19 and 20), right frontal (series 3, image 33 and 34), and more superior right parietal lobe (series 3, image 32). Mild local mass effect in the right temporal lobe without significant midline shift. T2 hyperintense signal in the periventricular white matter, likely the sequela of chronic small vessel ischemic disease. Remote left cerebellar and bilateral basal ganglia infarcts. Vascular: Please see MRA findings below. Skull and upper cervical spine: Normal marrow signal. Redemonstrated advanced C1-C2 degenerative changes, which cause severe spinal canal stenosis. Sinuses/Orbits: Mucosal thickening in the right-greater-than-left maxillary sinus and left sphenoid sinus. Air-fluid level in the right sphenoid sinus. Mucosal thickening in the ethmoid air cells. The orbits are unremarkable. Other: Fluid in the left-greater-than-right mastoid air cells. MRA HEAD FINDINGS Anterior circulation: Both internal carotid arteries are patent to the termini, with patency of the right ICA new compared to the prior CTA. Moderate focal stenosis in the proximal right cavernous carotid, secondary to calcified plaques. Left A1 patent. Aplastic right A1. Normal anterior communicating artery. Moderate to severe narrowing in the left A2 (series 4, image 119). Anterior cerebral arteries are otherwise patent to their distal aspects. Mild left M1 stenosis. No right M1 stenosis or occlusion. Normal MCA bifurcations. Distal MCA branches perfused and symmetric. Posterior circulation: Proximal left V4 is not visualized, with reconstitution after the takeoff of the left PICA. The right V4 is patent. Basilar patent to its distal aspect, although  there is mild-to-moderate narrowing after the takeoff of the AICAs. Patent right superior cerebellar artery, originating from the basilar. The left superior cerebellar artery is significantly narrowed at its origin and is poorly visualized, and is likely partially supplied by the left posterior communicating artery and left P1. The left proximal P1 is hypoplastic, with a suspected common origin with the left SCA. The majority of flow is supplied by the left posterior communicating artery. Mild narrowing in the proximal left P2. Left PCA is otherwise patent. The right PCA originates from the basilar artery and a somewhat diminutive right posterior communicating artery. The distal right PCA segments are patent. Anatomic variants: None significant IMPRESSION: MRI FINDINGS: 1. Acute infarcts in the right MCA and PCA territory, most prominent in the right temporal lobe and right occipital lobe, with involvement of the right inferior parietal lobe, right thalamus and right basal ganglia. These areas also demonstrate gyral swelling and susceptibility, concerning for hemorrhage, most prominent in the right temporal lobe. No mass effect or significant midline shift. 2. Additional punctate foci of restricted diffusion in the bilateral frontal and parietal lobes, as well as the left occipital lobe, likely embolic. MRA FINDINGS: 1. Resolution of previously noted occlusion of the right ICA, status post thrombectomy. Moderate focal stenosis in the proximal right cavernous carotid, secondary to calcification. 2. Moderate to severe narrowing in the left A2. 3. Poor visualization of the left V4; reconstitution  after the takeoff of the left PICA may be retrograde. 4. Significant narrowing of the origin of the left superior cerebellar artery, which is poorly visualized. This is likely partially supplied by the left posterior communicating artery and left P1. Electronically Signed   By: Merilyn Baba M.D.   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 No intravenous contrast was administered. COMPARISON:  MRI head 09/07/2021, correlation is also made with CTA head neck 09/14/2021. FINDINGS: Alignment: Physiologic. Vertebrae: No acute fracture or suspicious osseous lesion. Extensive, diffuse idiopathic skeletal hyperostosis, with bulky ventral spurs, extending from the inferior endplate of C2 to the superior endplate of C7. 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 Significant widening of the atlantodental interval, measuring up to 8 mm and best seen on the same-day MRI of the head, likely secondary to ligamentous hypertrophy. Additional ossification and thickening of the posterior longitudinal ligament narrows the spinal canal at C1-C2. Congenitally short pedicles, which narrow the AP diameter of the spinal canal. Cord: Normal cord signal. Multifocal cord flattening at C1-C2, C3-C4, C4-C5, and C5-C6. Otherwise normal morphology. Posterior Fossa, vertebral arteries, paraspinal tissues: Evidence of remote infarcts in the pons and left cerebellum. Poor visualization of the left vertebral artery. Disc levels: C1-C2: Incompletely evaluated in the axial plane given the field of view, which extends to just below C1; the field-of-view of the MRI brain extends to just above C1. On sagittal images, there is severe spinal canal stenosis, with cord flattening and compression, but without cord signal abnormality. C2-C3: Mild disc bulge. Facet and uncovertebral hypertrophy, with additional ossification medial to the left facets. Mild spinal canal stenosis. Moderate to severe left and mild-to-moderate right neural foraminal narrowing. C3-C4: Disc height  loss and moderate disc bulge, which indents and deforms the ventral cord. Facet and uncovertebral hypertrophy. Severe spinal canal stenosis with cord flattening. Mild bilateral neural foraminal narrowing. C4-C5: Broad-based disc bulge, which indents the ventral cord. Severe spinal canal stenosis with cord flattening. Uncovertebral and facet hypertrophy. Mild bilateral neural foraminal narrowing. C5-C6: Disc bulge which indents the ventral cord and causes cord flattening. Uncovertebral and facet arthropathy. Mild bilateral neural foraminal narrowing. C6-C7: Disc bulge. Facet and uncovertebral hypertrophy. Moderate spinal canal stenosis. Mild-to-moderate bilateral neural foraminal narrowing. C7-T1: No spinal canal stenosis or neural foraminal narrowing. IMPRESSION: 1. C1-C2 severe spinal canal stenosis, with cord flattening and compression, without cord signal abnormality. This is secondary to ligamentous thickening, which widens the atlantodental interval, with additional ossification thickening of the posterior longitudinal ligament. 2. C3-C4, C4-C5, and C5-C6 severe spinal canal stenosis with cord flattening. No cord signal abnormality is seen at these levels. 3. C2-C3 mild spinal canal stenosis with moderate to severe left and mild-to-moderate right neural foraminal narrowing. 4. C6-C7 moderate spinal canal stenosis and mild-to-moderate bilateral neural foraminal narrowing. 5. Electronically Signed   By: Merilyn Baba M.D.   On: 09/15/2021 14:20   MR FOOT LEFT WO CONTRAST  Result Date: 10/01/2021 CLINICAL DATA:  Foot pain and swelling. EXAM: MRI OF THE LEFT FOOT WITHOUT CONTRAST TECHNIQUE: Multiplanar, multisequence MR imaging of the left foot was performed. No intravenous contrast was administered. COMPARISON:  None. FINDINGS: Bones/Joint/Cartilage No fracture or dislocation. Normal alignment. No joint effusion. T2 hyperintense signal in the  tuft of the first distal phalanx which may reflect poor fat  saturation versus mild marrow edema in the tuft of the first distal phalanx. Ligaments Collateral ligaments are intact.  Lisfranc ligament is intact. Muscles and Tendons Flexor, peroneal and extensor compartment tendons are intact. Muscles are normal. Soft tissue No fluid collection or hematoma. No soft tissue mass. Soft tissue edema along the dorsal aspect of the foot which may be reactive versus secondary to cellulitis. IMPRESSION: 1. T2 hyperintense signal in the tuft of the first distal phalanx which may reflect poor fat saturation versus mild marrow edema in the tuft of the first distal phalanx as can be seen with reactive edema versus osteomyelitis. Electronically Signed   By: Kathreen Devoid M.D.   On: 10/01/2021 16:31   IR CT Head Ltd  Result Date: 09/15/2021 INDICATION: KYNZLEE HUCKER is a 71 year old female with a medical history significant for end-stage renal disease on hemodialysis, NSTEMI, coronary artery disease, essential hypertension, hyperlipidemia, right above knee amputation, history of TIA/stroke, and type 2 diabetes mellitus who presented to the ED 12/27 via EMS for evaluation of left-sided weakness, left-facial droop, and with slurred speech / groaning vocalizations. Her last known well was 00:00 on 09/14/2021. She did not receive IV thrombolytic as she was outside the window. NIHSS at presentation was 24; baseline modified Rankin scale 3. Head CT showed hypodensity in the right occipital lobe and right anterior temporal lobe. No hemorrhage. 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 CT perfusion showed a core infarct of 29 cc and a 168 cc of penumbra affecting the right MCA and PCA territories. She was then transferred to our service  for a mechanical thrombectomy. EXAM: ULTRASOUND-GUIDED VASCULAR ACCESS DIAGNOSTIC CEREBRAL ANGIOGRAM AND MECHANICAL THROMBECTOMY FLAT PANEL HEAD CT COMPARISON:  CT/CT angiogram of the head and neck September 14, 2021. MEDICATIONS: Refer to anesthesia documentation. ANESTHESIA/SEDATION: The procedure was performed under general anesthesia. CONTRAST:  20 mL of Omnipaque 300 milligram/mL FLUOROSCOPY TIME:  Fluoroscopy Time: 2 minutes 6 seconds. COMPLICATIONS: None immediate. TECHNIQUE: Informed written consent was obtained from the patient's sister after a thorough discussion of the procedural risks, benefits and alternatives. All questions were addressed. Maximal Sterile Barrier Technique was utilized including caps, mask, sterile gowns, sterile gloves, sterile drape, hand hygiene and skin antiseptic. A timeout was performed prior to the initiation of the procedure. The left groin was prepped and draped in the usual sterile fashion. Using a micropuncture kit and the modified Seldinger technique, access was gained to the left common femoral artery and an 8 French sheath was placed. Real-time ultrasound guidance was utilized for vascular access including the acquisition of a permanent ultrasound image documenting patency of the accessed vessel. Under fluoroscopy, a Zoom 88 guide catheter was navigated over a 6 [place] VTK catheter and a 0.035" Terumo Glidewire into the aortic arch. The catheter was placed into the right common carotid artery. Frontal and lateral angiograms of the neck were obtained. The catheter was then advanced into the proximal cervical right internal carotid artery. FINDINGS: 1. The left common femoral artery has normal caliber, adequate for vascular access. 2. Atherosclerotic changes of the right carotid bifurcation resulting in approximately 35% stenosis. 3. 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 PROCEDURE:  The diagnostic catheter was removed. The zoom 88  catheter was then connected to an aspiration pump and advanced in the right internal carotid artery up to the petrous cavernous junction under constant aspiration. Continuous aspiration was performed for 2 minutes. Then, the catheter was slowly retracted while under continuous aspiration to the level of the right common carotid artery. At this point, brisk blood return was noted with a large clot collected into the aspiration canister. The aspiration tubing was disconnected from the catheter and the catheter was manually aspirated for debris. Frontal and lateral angiograms of the neck and skull base showed recanalization of the cervical and intracranial right ICA. Angiograms with frontal and lateral views of the head were then obtained showing no evidence of thromboembolic complication. Atherosclerotic changes of the intracranial right ICA are noted with approximately 50% stenosis at the petrous cavernous junction. No opacification of the right ACA noted due to aplastic right A1/ACA. The catheter was subsequently withdrawn. Flat panel CT of the head was obtained and post processed in a separate workstation with concurrent attending physician supervision. Selected images were sent to PACS. No evidence of hemorrhagic complication. Left common femoral artery angiograms were obtained in frontal and lateral views. Mild atherosclerotic changes are noted in the left common femoral artery without significant stenosis. The femoral sheath was then exchanged for a Perclose pro style which was utilized for access closure. Immediate hemostasis was achieved. 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 A total of 1 pass was performed with direct contact aspiration with complete recanalization (TICI 3). No evidence of hemorrhagic or thromboembolic complication. PLAN: 1. Bed rest x6 hours post femoral puncture. 2. SBP 120-140 mm Hg  for 24 hours. 3. Further management per Neurology. Electronically Signed   By: Pedro Earls M.D.   On: 09/15/2021 16:52   IR US Guide Vasc Access Left  Result Date: 09/15/2021 INDICATION: Jenna Brown is a 71 year old female with a medical history significant for end-stage renal disease on hemodialysis, NSTEMI, coronary artery disease, essential hypertension, hyperlipidemia, right above knee amputation, history of TIA/stroke, and type 2 diabetes mellitus who presented to the ED 12/27 via EMS for evaluation of left-sided weakness, left-facial droop, and with slurred speech / groaning vocalizations. Her last known well was 00:00 on 09/14/2021. She did not receive IV thrombolytic as she was outside the window. NIHSS at presentation was 24; baseline modified Rankin scale 3. Head CT showed hypodensity in the right occipital lobe and right anterior temporal lobe. No hemorrhage. 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 CT perfusion showed a core infarct of 29 cc and a 168 cc of penumbra affecting the right MCA and PCA territories. She was then transferred to our service for a mechanical thrombectomy. EXAM: ULTRASOUND-GUIDED VASCULAR ACCESS DIAGNOSTIC CEREBRAL ANGIOGRAM AND MECHANICAL THROMBECTOMY FLAT PANEL HEAD CT COMPARISON:  CT/CT angiogram of the head and neck September 14, 2021. MEDICATIONS: Refer to anesthesia documentation. ANESTHESIA/SEDATION: The procedure was performed under general anesthesia. CONTRAST:  20 mL of Omnipaque 300 milligram/mL FLUOROSCOPY TIME:  Fluoroscopy Time: 2 minutes 6 seconds. COMPLICATIONS: None immediate. TECHNIQUE: Informed written consent was obtained from the patient's sister after a thorough discussion of  the procedural risks, benefits and alternatives.  All questions were addressed. Maximal Sterile Barrier Technique was utilized including caps, mask, sterile gowns, sterile gloves, sterile drape, hand hygiene and skin antiseptic. A timeout was performed prior to the initiation of the procedure. The left groin was prepped and draped in the usual sterile fashion. Using a micropuncture kit and the modified Seldinger technique, access was gained to the left common femoral artery and an 8 French sheath was placed. Real-time ultrasound guidance was utilized for vascular access including the acquisition of a permanent ultrasound image documenting patency of the accessed vessel. Under fluoroscopy, a Zoom 88 guide catheter was navigated over a 6 [place] VTK catheter and a 0.035" Terumo Glidewire into the aortic arch. The catheter was placed into the right common carotid artery. Frontal and lateral angiograms of the neck were obtained. The catheter was then advanced into the proximal cervical right internal carotid artery. FINDINGS: 1. The left common femoral artery has normal caliber, adequate for vascular access. 2. Atherosclerotic changes of the right carotid bifurcation resulting in approximately 35% stenosis. 3. 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 PROCEDURE: The diagnostic catheter was removed. The zoom 88 catheter was then connected to an aspiration pump and advanced in the right internal carotid artery up to the petrous cavernous junction under constant aspiration. Continuous aspiration was performed for 2 minutes. Then, the catheter was slowly retracted while under continuous aspiration to the level of the right common carotid artery. At this point, brisk blood return was noted with a large clot collected into the aspiration canister. The aspiration tubing was disconnected from the catheter and the catheter was manually aspirated for debris. Frontal and lateral angiograms of  the neck and skull base showed recanalization of the cervical and intracranial right ICA. Angiograms with frontal and lateral views of the head were then obtained showing no evidence of thromboembolic complication. Atherosclerotic changes of the intracranial right ICA are noted with approximately 50% stenosis at the petrous cavernous junction. No opacification of the right ACA noted due to aplastic right A1/ACA. The catheter was subsequently withdrawn. Flat panel CT of the head was obtained and post processed in a separate workstation with concurrent attending physician supervision. Selected images were sent to PACS. No evidence of hemorrhagic complication. Left common femoral artery angiograms were obtained in frontal and lateral views. Mild atherosclerotic changes are noted in the left common femoral artery without significant stenosis. The femoral sheath was then exchanged for a Perclose pro style which was utilized for access closure. Immediate hemostasis was achieved. 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 A total of 1 pass was performed with direct contact aspiration with complete recanalization (TICI 3). No evidence of hemorrhagic or thromboembolic complication. PLAN: 1. Bed rest x6 hours post femoral puncture. 2. SBP 120-140 mm Hg for 24 hours. 3. Further management per Neurology. Electronically Signed   By: Pedro Earls M.D.   On: 09/15/2021 16:52   DG CHEST PORT 1 VIEW  Result Date: 09/26/2021 CLINICAL DATA:  Acute respiratory failure. EXAM: PORTABLE CHEST 1 VIEW COMPARISON:  09/23/2021 and older exams.  Chest CT, 09/22/2021. FINDINGS: Lung volumes remain low. There is left greater than right lung base opacity consistent with a combination of atelectasis and small effusions. Remainder of the lungs is clear. No pneumothorax. New enteric tube passes below the diaphragm, well into the  stomach and below the included field of view. Previous NG tube and ET tube have  been removed. IMPRESSION: 1. No significant change in lung aeration. Stable, left greater than right, lung base opacities, consistent with atelectasis and small effusions, accentuated by low lung volumes. 2. Status post extubation. Electronically Signed   By: Lajean Manes M.D.   On: 09/26/2021 13:33   DG Chest Port 1 View  Result Date: 09/23/2021 CLINICAL DATA:  Acute respiratory failure. EXAM: PORTABLE CHEST 1 VIEW COMPARISON:  CT chest from yesterday. Chest x-ray dated September 21, 2021. FINDINGS: Unchanged endotracheal and enteric tubes. Stable cardiomediastinal silhouette with cardiomegaly. Unchanged small bilateral pleural effusions and bibasilar atelectasis, worse on the left. No pneumothorax. No acute osseous abnormality. IMPRESSION: 1. Unchanged small bilateral pleural effusions and bibasilar atelectasis. Electronically Signed   By: Titus Dubin M.D.   On: 09/23/2021 08:14   DG CHEST PORT 1 VIEW  Result Date: 09/21/2021 CLINICAL DATA:  Hypoxia, patient having dialysis EXAM: PORTABLE CHEST 1 VIEW COMPARISON:  Chest radiograph 09/17/2021 FINDINGS: Endotracheal tube tip is approximately 2.9 cm from the carina. The enteric catheter tip is off the field of view, but the side hole is below the level of the GE junction. Lung volumes are low. There is a small left pleural effusion with adjacent retrocardiac opacity, similar to the prior study. There is no other focal airspace disease. There is no right pleural effusion. There is no pneumothorax. The bones are stable. IMPRESSION: Low lung volumes with a small left pleural effusion and adjacent retrocardiac opacity, overall not significantly changed. No new or worsening focal airspace disease. Electronically Signed   By: Valetta Mole M.D.   On: 09/21/2021 09:25   DG CHEST PORT 1 VIEW  Result Date: 09/17/2021 CLINICAL DATA:  Acute respiratory failure, hypoxia EXAM: PORTABLE  CHEST 1 VIEW COMPARISON:  Chest radiograph 09/14/2021 FINDINGS: Endotracheal tube tip is approximately 1.9 cm from the carina. The enteric catheter tip is off the field of view, but the side hole is below the level of the GE junction. The cardiomediastinal silhouette is stable. There is a left pleural effusion, increased in size since 09/14/2021, with worsened aeration of the left base. The right lung is clear. There is no right effusion. There is no pneumothorax. There is no acute osseous abnormality. IMPRESSION: Increased size of a left pleural effusion with worsened aeration of the left base since 09/14/2021. Electronically Signed   By: Valetta Mole M.D.   On: 09/17/2021 12:30   DG Chest Portable 1 View  Result Date: 09/14/2021 CLINICAL DATA:  Code stroke earlier today. Intubation and orogastric tube placement. EXAM: PORTABLE CHEST 1 VIEW COMPARISON:  Radiographs 08/17/2021 and 08/16/2021. FINDINGS: 1217 hours. Interval intubation. Tip of the endotracheal tube is approximately 2.7 cm above the carina. Orogastric tube projects below the diaphragm, tip not visualized. There are lower lung volumes with increased perihilar left lower lobe airspace opacities bilaterally. No pneumothorax or significant pleural effusion identified. The heart size and mediastinal contours are stable. IMPRESSION: 1. Satisfactory position of the endotracheal and enteric tubes. 2. New perihilar and left lower lobe airspace opacity suspicious for aspiration or edema. Electronically Signed   By: Richardean Sale M.D.   On: 09/14/2021 12:33   DG Abd Portable 1V  Result Date: 09/24/2021 CLINICAL DATA:  Enteric catheter placement EXAM: PORTABLE ABDOMEN - 1 VIEW COMPARISON:  09/14/2021 FINDINGS: Frontal view of the abdomen and pelvis was obtained, excluding the lower pelvis and right flank by collimation. Enteric catheter passes below diaphragm tip overlying the gastric antrum. Nonspecific gaseous distention of the bowel again noted.  Evaluation limited by patient motion. IMPRESSION: 1. Enteric catheter tip projecting over gastric antrum. Electronically Signed   By: Randa Ngo M.D.   On: 09/24/2021 15:22   DG Abd Portable 1V  Result Date: 09/14/2021 CLINICAL DATA:  Code stroke earlier today. Intubation and orogastric tube placement. EXAM: PORTABLE ABDOMEN - 1 VIEW COMPARISON:  Chest radiographs same date. FINDINGS: 1212 hours. Tip of the enteric tube projects over the L1 vertebral body, consistent with position in the distal stomach. The visualized bowel gas pattern is nonobstructive. There are degenerative changes in the spine associated with a mild convex left lumbar scoliosis. Left lower lobe airspace disease noted, as described on chest radiographs. IMPRESSION: Enteric tube positioned over the distal stomach. Electronically Signed   By: Richardean Sale M.D.   On: 09/14/2021 12:34   ECHOCARDIOGRAM COMPLETE  Result Date: 09/15/2021    ECHOCARDIOGRAM REPORT   Patient Name:   Jenna Brown Date of Exam: 09/15/2021 Medical Rec #:  [id number]     Height:       64.0 in Accession #:    [id number]    Weight:       163.6 lb Date of Birth:  08-09-1951    BSA:          1.796 m Patient Age:    38 years      BP:           123/82 mmHg Patient Gender: F             HR:           112 bpm. Exam Location:  Inpatient Procedure: 2D Echo, Cardiac Doppler, Color Doppler and Intracardiac            Opacification Agent Indications:    CHF  History:        Patient has prior history of Echocardiogram examinations. CAD,                 TIA; Risk Factors:Hypertension and Diabetes.  Sonographer:    Jyl Heinz Referring Phys: [id number] Aguila XU IMPRESSIONS  1. Apical, mid-apical anterior, mid-apical anteroseptal, mid-apical inferoseptal, mid-apical inferior, and mid-apical inferolateral akinesis. Otherwise global hypokinesis. Left ventricular ejection fraction, by estimation, is 25 to 30%. The left ventricle has severely decreased function. The left  ventricle demonstrates regional wall motion abnormalities (see scoring diagram/findings for description). The left ventricular internal cavity size was mildly dilated. There is mild left ventricular hypertrophy of the basal-septal segment. Left ventricular diastolic parameters are consistent with Grade I diastolic dysfunction (impaired relaxation).  2. Right ventricular systolic function is normal. The right ventricular size is normal.  3. Left atrial size was mildly dilated.  4. The mitral valve is normal in structure. No evidence of mitral valve regurgitation. No evidence of mitral stenosis. Moderate mitral annular calcification.  5. The aortic valve is tricuspid. There is mild calcification of the aortic valve. There is mild thickening of the aortic valve. Aortic valve regurgitation is not visualized. No aortic stenosis is present.  6. The inferior vena cava is normal in size with <50% respiratory variability, suggesting right atrial pressure of 8 mmHg. FINDINGS  Left Ventricle: Apical, mid-apical anterior, mid-apical anteroseptal, mid-apical inferoseptal, mid-apical inferior, and mid-apical inferolateral akinesis. Otherwise global hypokinesis. Left ventricular ejection fraction, by estimation, is 25 to 30%. The  left ventricle has severely decreased function. The left ventricle demonstrates regional wall motion abnormalities. The left ventricular internal cavity size was mildly dilated. There is mild left ventricular hypertrophy of the  basal-septal segment. Left ventricular diastolic parameters are consistent with Grade I diastolic dysfunction (impaired relaxation). Indeterminate filling pressures. Right Ventricle: The right ventricular size is normal. No increase in right ventricular wall thickness. Right ventricular systolic function is normal. Left Atrium: Left atrial size was mildly dilated. Right Atrium: Right atrial size was normal in size. Pericardium: There is no evidence of pericardial effusion. Mitral  Valve: The mitral valve is normal in structure. Moderate mitral annular calcification. No evidence of mitral valve regurgitation. No evidence of mitral valve stenosis. Tricuspid Valve: The tricuspid valve is normal in structure. Tricuspid valve regurgitation is not demonstrated. No evidence of tricuspid stenosis. Aortic Valve: The aortic valve is tricuspid. There is mild calcification of the aortic valve. There is mild thickening of the aortic valve. Aortic valve regurgitation is not visualized. No aortic stenosis is present. Aortic valve peak gradient measures 4.0 mmHg. Pulmonic Valve: The pulmonic valve was normal in structure. Pulmonic valve regurgitation is trivial. No evidence of pulmonic stenosis. Aorta: The aortic root is normal in size and structure. Venous: The inferior vena cava is normal in size with less than 50% respiratory variability, suggesting right atrial pressure of 8 mmHg. IAS/Shunts: No atrial level shunt detected by color flow Doppler.  LEFT VENTRICLE PLAX 2D LVIDd:         5.20 cm      Diastology LVIDs:         4.50 cm      LV e' medial:    8.27 cm/s LV PW:         1.00 cm      LV E/e' medial:  9.9 LV IVS:        1.10 cm      LV e' lateral:   10.20 cm/s LVOT diam:     1.80 cm      LV E/e' lateral: 8.0 LV SV:         25 LV SV Index:   14 LVOT Area:     2.54 cm  LV Volumes (MOD) LV vol d, MOD A2C: 170.0 ml LV vol d, MOD A4C: 143.0 ml LV vol s, MOD A2C: 127.0 ml LV vol s, MOD A4C: 105.0 ml LV SV MOD A2C:     43.0 ml LV SV MOD A4C:     143.0 ml LV SV MOD BP:      44.6 ml RIGHT VENTRICLE            IVC RV Basal diam:  3.10 cm    IVC diam: 1.40 cm RV Mid diam:    2.50 cm RV S prime:     9.32 cm/s TAPSE (M-mode): 1.7 cm LEFT ATRIUM             Index        RIGHT ATRIUM          Index LA diam:        3.80 cm 2.12 cm/m   RA Area:     8.15 cm LA Vol (A2C):   54.7 ml 30.45 ml/m  RA Volume:   16.40 ml 9.13 ml/m LA Vol (A4C):   38.2 ml 21.27 ml/m LA Biplane Vol: 45.8 ml 25.50 ml/m  AORTIC VALVE AV  Area (Vmax): 1.87 cm AV Vmax:        99.90 cm/s AV Peak Grad:   4.0 mmHg LVOT Vmax:      73.30 cm/s LVOT Vmean:     50.700 cm/s LVOT VTI:       0.099  m  AORTA Ao Root diam: 3.10 cm Ao Asc diam:  2.80 cm MITRAL VALVE MV Area (PHT): 6.51 cm     SHUNTS MV Decel Time: 117 msec     Systemic VTI:  0.10 m MV E velocity: 82.10 cm/s   Systemic Diam: 1.80 cm MV A velocity: 103.00 cm/s MV E/A ratio:  0.80 Skeet Latch Brown Electronically signed by Skeet Latch Brown Signature Date/Time: 09/15/2021/8:18:14 PM    Final    IR PERCUTANEOUS ART THROMBECTOMY/INFUSION INTRACRANIAL INC DIAG ANGIO  Result Date: 09/15/2021 INDICATION: SYMPHONI HELBLING is a 71 year old female with a medical history significant for end-stage renal disease on hemodialysis, NSTEMI, coronary artery disease, essential hypertension, hyperlipidemia, right above knee amputation, history of TIA/stroke, and type 2 diabetes mellitus who presented to the ED 12/27 via EMS for evaluation of left-sided weakness, left-facial droop, and with slurred speech / groaning vocalizations. Her last known well was 00:00 on 09/14/2021. She did not receive IV thrombolytic as she was outside the window. NIHSS at presentation was 24; baseline modified Rankin scale 3. Head CT showed hypodensity in the right occipital lobe and right anterior temporal lobe. No hemorrhage. 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 CT perfusion showed a core infarct of 29 cc and a 168 cc of penumbra affecting the right MCA and PCA territories. She was then transferred to our service for a mechanical thrombectomy. EXAM: ULTRASOUND-GUIDED VASCULAR ACCESS DIAGNOSTIC CEREBRAL ANGIOGRAM AND MECHANICAL THROMBECTOMY FLAT PANEL HEAD CT COMPARISON:   CT/CT angiogram of the head and neck September 14, 2021. MEDICATIONS: Refer to anesthesia documentation. ANESTHESIA/SEDATION: The procedure was performed under general anesthesia. CONTRAST:  20 mL of Omnipaque 300 milligram/mL FLUOROSCOPY TIME:  Fluoroscopy Time: 2 minutes 6 seconds. COMPLICATIONS: None immediate. TECHNIQUE: Informed written consent was obtained from the patient's sister after a thorough discussion of the procedural risks, benefits and alternatives. All questions were addressed. Maximal Sterile Barrier Technique was utilized including caps, mask, sterile gowns, sterile gloves, sterile drape, hand hygiene and skin antiseptic. A timeout was performed prior to the initiation of the procedure. The left groin was prepped and draped in the usual sterile fashion. Using a micropuncture kit and the modified Seldinger technique, access was gained to the left common femoral artery and an 8 French sheath was placed. Real-time ultrasound guidance was utilized for vascular access including the acquisition of a permanent ultrasound image documenting patency of the accessed vessel. Under fluoroscopy, a Zoom 88 guide catheter was navigated over a 6 [place] VTK catheter and a 0.035" Terumo Glidewire into the aortic arch. The catheter was placed into the right common carotid artery. Frontal and lateral angiograms of the neck were obtained. The catheter was then advanced into the proximal cervical right internal carotid artery. FINDINGS: 1. The left common femoral artery has normal caliber, adequate for vascular access. 2. Atherosclerotic changes of the right carotid bifurcation resulting in approximately 35% stenosis. 3. 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 PROCEDURE: The diagnostic catheter was removed. The zoom 88 catheter was then connected to an aspiration pump and advanced in the right internal carotid artery up to the petrous cavernous junction under constant aspiration.  Continuous aspiration was performed for 2 minutes. Then, the catheter was slowly retracted while under  continuous aspiration to the level of the right common carotid artery. At this point, brisk blood return was noted with a large clot collected into the aspiration canister. The aspiration tubing was disconnected from the catheter and the catheter was manually aspirated for debris. Frontal and lateral angiograms of the neck and skull base showed recanalization of the cervical and intracranial right ICA. Angiograms with frontal and lateral views of the head were then obtained showing no evidence of thromboembolic complication. Atherosclerotic changes of the intracranial right ICA are noted with approximately 50% stenosis at the petrous cavernous junction. No opacification of the right ACA noted due to aplastic right A1/ACA. The catheter was subsequently withdrawn. Flat panel CT of the head was obtained and post processed in a separate workstation with concurrent attending physician supervision. Selected images were sent to PACS. No evidence of hemorrhagic complication. Left common femoral artery angiograms were obtained in frontal and lateral views. Mild atherosclerotic changes are noted in the left common femoral artery without significant stenosis. The femoral sheath was then exchanged for a Perclose pro style which was utilized for access closure. Immediate hemostasis was achieved. 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 A total of 1 pass was performed with direct contact aspiration with complete recanalization (TICI 3). No evidence of hemorrhagic or thromboembolic complication. PLAN: 1. Bed rest x6 hours post femoral puncture. 2. SBP 120-140 mm Hg for 24 hours. 3. Further management per Neurology. Electronically Signed   By: Pedro Earls M.D.   On: 09/15/2021 16:52   CT HEAD CODE STROKE WO  CONTRAST  Result Date: 09/14/2021 CLINICAL DATA:  Code stroke.  Aphasia EXAM: CT HEAD WITHOUT CONTRAST TECHNIQUE: Contiguous axial images were obtained from the base of the skull through the vertex without intravenous contrast. COMPARISON:  04/22/2004 FINDINGS: Brain: Chronic appearing infarctsat the bilateral basal ganglia and left cerebellum since prior. No hemorrhage or definite acute infarct. No hydrocephalus, masslike finding, or collection. Vascular: No hyperdense vessel or unexpected calcification. Skull: Normal. Negative for fracture or focal lesion. Sinuses/Orbits: No acute finding. Other: These results were communicated to Dr Theda Sers at 9:20 am on 09/14/2021 by text page via the [facility] messaging system. ASPECTS [facility] Stroke Program Early CT Score) - Ganglionic level infarction (caudate, lentiform nuclei, internal capsule, insula, M1-M3 cortex): 7 - Supraganglionic infarction (M4-M6 cortex): 3 Total score (0-10 with 10 being normal): 10 Motion degraded.  Cortical infarcts could be obscured. IMPRESSION: 1. No acute finding, although motion affect sensitivity. 2. Bilateral basal ganglia and left cerebellar infarcts since 2005 comparison. 3. C1-2 subluxation with upper cord impingement, also seen in 2005. Suggest cervical MRI when able Electronically Signed   By: Jorje Guild M.D.   On: 09/14/2021 09:23   CT ANGIO HEAD NECK W WO CM W PERF (CODE STROKE)  Result Date: 09/14/2021 CLINICAL DATA:  Aphasia EXAM: CT ANGIOGRAPHY HEAD AND NECK CT PERFUSION BRAIN TECHNIQUE: Multidetector CT imaging of the head and neck was performed using the standard protocol during bolus administration of intravenous contrast. Multiplanar CT image reconstructions and MIPs were obtained to evaluate the vascular anatomy. Carotid stenosis measurements (when applicable) are obtained utilizing NASCET criteria, using the distal internal carotid diameter as the denominator. Multiphase CT imaging of the brain was performed  following IV bolus contrast injection. Subsequent parametric perfusion maps were calculated using RAPID software. CONTRAST:  Dose is not known on this in progress study COMPARISON:  Head CT from earlier the same  day FINDINGS: CTA NECK FINDINGS Aortic arch: Atheromatous plaque with 2 vessel branching. Right carotid system: Under filled common carotid compared to the left. There is bulky calcified plaque at the bifurcation with occlusion of the ICA based on the arterial phase. Left carotid system: Calcified plaque at the origin and bifurcation. Proximal ICA stenosis measures 40%. Vertebral arteries: Proximal subclavian atherosclerosis. No flow limiting subclavian stenosis. Bulky calcified plaque at the left vertebral origin. No flow seen in the left vertebral artery. Mild narrowing at the right vertebral origin where there is adjacent subclavian plaque. The right vertebral artery is patent to the basilar. Skeleton: Generalized cervical spine degeneration. Diffuse idiopathic skeletal hyperostosis with bulky ventral spurring. Advanced C1-2 facet degeneration with atlantal dental widening and adjacent ligamentous thickening causing cord compression. The Landau dental interval is nearly 8 mm. Other neck: No acute finding Upper chest: Small volume layering right pleural effusion. Review of the MIP images confirms the above findings CTA HEAD FINDINGS Anterior circulation: Right ICA occlusion with faint reconstitution beginning at the ophthalmic segment. Aplastic right A1 segment and diminutive right posterior communicating artery. Downstream vessels are difficult to assess due to underfilling. Mild left M1 segment stenosis. No noted left-sided or anterior cerebral embolism. Posterior circulation: Reconstitution of the distal left vertebral artery serving the PICA. Moderate atheromatous narrowing at the right V4 segment. Advanced mid basilar stenosis. Hypoplastic P1 segments. Venous sinuses: Unremarkable on this study.  Anatomic variants: As above CT Brain Perfusion Findings: ASPECTS: 10, but degraded by motion CBF (<30%) Volume: 22m Perfusion (Tmax>6.0s) volume: 1959mMismatch Volume: 16855mnfarction Location:Right temporoparietal cortex and deep white matter. Page placed to Dr. ColTheda Sersfore signing. The patient is currently being transported to Interventional. IMPRESSION: 1. Emergent large vessel occlusion at the proximal right ICA with reconstituted but under filled right cavernous carotid. Circle-of-Willis variant with essentially isolated right M1 segment and a fetal type right PCA. CT perfusion suggests core infarct of 29 cc and 168 cc of penumbra affecting the right MCA and PCA territories. 2. Tenuous flow in the posterior circulation. The left vertebral artery is occluded with minimal retrograde flow in the left V4 reaching the PICA. The right vertebral artery is highly stenotic at the V4 segment. High-grade mid basilar stenosis. 3. Atlantodental instability with cord compression. Electronically Signed   By: JonJorje GuildD.   On: 09/14/2021 09:44    Microbiology: Results for orders placed or performed during the hospital encounter of 09/14/21  MRSA Next Gen by PCR, Nasal     Status: None   Collection Time: 09/14/21 11:40 AM   Specimen: Nasal Mucosa; Nasal Swab  Result Value Ref Range Status   MRSA by PCR Next Gen NOT DETECTED NOT DETECTED Final    Comment: (NOTE) The GeneXpert MRSA Assay (FDA approved for NASAL specimens only), is one component of a comprehensive MRSA colonization surveillance program. It is not intended to diagnose MRSA infection nor to guide or monitor treatment for MRSA infections. Test performance is not FDA approved in patients less than 2 y60ars old. Performed at [facility] [id number] Panel by RT-PCR (Flu A&B, Covid) Nasopharyngeal Swab     Status: Abnormal   Collection Time: 09/14/21  4:40 PM   Specimen: Nasopharyngeal Swab;  Nasopharyngeal(NP) swabs in vial transport medium  Result Value Ref Range Status   SARS Coronavirus 2 by RT PCR POSITIVE (A) NEGATIVE Final    Comment: (NOTE) SARS-CoV-2 target nucleic acids are DETECTED.  The SARS-CoV-2 RNA is generally detectable in upper respiratory specimens during the acute phase of infection. Positive results are indicative of the presence of the identified virus, but do not rule out bacterial infection or co-infection with other pathogens not detected by the test. Clinical correlation with patient history and other diagnostic information is necessary to determine patient infection status. The expected result is Negative.  Fact Sheet for Patients: EntrepreneurPulse.com.au  Fact Sheet for Healthcare Providers: IncredibleEmployment.be  This test is not yet approved or cleared by the [place] FDA and  has been authorized for detection and/or diagnosis of SARS-CoV-2 by FDA under an Emergency Use Authorization (EUA).  This EUA will remain in effect (meaning this test can be used) for the duration of  the COVID-19 declaration under Section 564(b)(1) of the A ct, 21 U.S.C. section 360bbb-3(b)(1), unless the authorization is terminated or revoked sooner.     Influenza A by PCR NEGATIVE NEGATIVE Final   Influenza B by PCR NEGATIVE NEGATIVE Final    Comment: (NOTE) The Xpert Xpress SARS-CoV-2/FLU/RSV plus assay is intended as an aid in the diagnosis of influenza from Nasopharyngeal swab specimens and should not be used as a sole basis for treatment. Nasal washings and aspirates are unacceptable for Xpert Xpress SARS-CoV-2/FLU/RSV testing.  Fact Sheet for Patients: EntrepreneurPulse.com.au  Fact Sheet for Healthcare Providers: IncredibleEmployment.be  This test is not yet approved or cleared by the [place] FDA and has been authorized for detection and/or diagnosis of SARS-CoV-2  by FDA under an Emergency Use Authorization (EUA). This EUA will remain in effect (meaning this test can be used) for the duration of the COVID-19 declaration under Section 564(b)(1) of the Act, 21 U.S.C. section 360bbb-3(b)(1), unless the authorization is terminated or revoked.  Performed at [facility], [place] [address], [place], [place] [address]   Culture, Respiratory w Gram Stain     Status: None   Collection Time: 09/17/21 12:02 PM   Specimen: Tracheal Aspirate; Respiratory  Result Value Ref Range Status   Specimen Description TRACHEAL ASPIRATE  Final   Special Requests NONE  Final   Gram Stain   Final    ABUNDANT WBC PRESENT,BOTH PMN AND MONONUCLEAR FEW GRAM VARIABLE ROD Performed at [facility], [place] [address] [place], [place] [address]    Culture FEW KLEBSIELLA ORNITHINOLYTICA  Final   Report Status 09/20/2021 FINAL  Final   Organism ID, Bacteria KLEBSIELLA ORNITHINOLYTICA  Final      Susceptibility   Klebsiella ornithinolytica - MIC*    AMPICILLIN RESISTANT Resistant     CEFAZOLIN <=4 SENSITIVE Sensitive     CEFEPIME <=0.12 SENSITIVE Sensitive     CEFTAZIDIME <=1 SENSITIVE Sensitive     CEFTRIAXONE <=0.25 SENSITIVE Sensitive     CIPROFLOXACIN <=0.25 SENSITIVE Sensitive     GENTAMICIN <=1 SENSITIVE Sensitive     IMIPENEM 1 SENSITIVE Sensitive     TRIMETH/SULFA <=20 SENSITIVE Sensitive     AMPICILLIN/SULBACTAM <=2 SENSITIVE Sensitive     PIP/TAZO <=4 SENSITIVE Sensitive     * FEW KLEBSIELLA ORNITHINOLYTICA  Culture, Respiratory w Gram Stain     Status: None   Collection Time: 09/23/21 10:25 AM   Specimen: Tracheal Aspirate; Respiratory  Result Value Ref Range Status   Specimen Description TRACHEAL ASPIRATE  Final   Special Requests NONE  Final   Gram Stain   Final    MODERATE SQUAMOUS EPITHELIAL CELLS PRESENT ABUNDANT WBC PRESENT,BOTH PMN AND MONONUCLEAR FEW GRAM NEGATIVE COCCI RARE YEAST  Culture   Final    RARE Normal respiratory flora-no  Staph aureus or Pseudomonas seen Performed at [place] [address], [place], [place] [address]    Report Status 09/25/2021 FINAL  Final  Culture, blood (Routine X 2) w Reflex to ID Panel     Status: None   Collection Time: 09/23/21 11:31 AM   Specimen: BLOOD LEFT ARM  Result Value Ref Range Status   Specimen Description BLOOD LEFT ARM  Final   Special Requests   Final    BOTTLES DRAWN AEROBIC AND ANAEROBIC Blood Culture adequate volume   Culture   Final    NO GROWTH 5 DAYS Performed at [facility], [place] [address], [place], [place] [address]    Report Status 09/28/2021 FINAL  Final  Culture, blood (Routine X 2) w Reflex to ID Panel     Status: None   Collection Time: 09/23/21 11:52 AM   Specimen: BLOOD LEFT HAND  Result Value Ref Range Status   Specimen Description BLOOD LEFT HAND  Final   Special Requests   Final    BOTTLES DRAWN AEROBIC AND ANAEROBIC Blood Culture results may not be optimal due to an inadequate volume of blood received in culture bottles   Culture   Final    NO GROWTH 5 DAYS Performed at [facility], [place] [address], [place], [place] [address]    Report Status 09/28/2021 FINAL  Final  C Difficile Quick Screen (NO PCR Reflex)     Status: None   Collection Time: 09/26/21  5:00 PM   Specimen: STOOL  Result Value Ref Range Status   C Diff antigen NEGATIVE NEGATIVE Final   C Diff toxin NEGATIVE NEGATIVE Final   C Diff interpretation No C. difficile detected.  Final    Comment: Performed at [facility], [place] [address] [place], [place] [address]  Culture, blood (routine x 2)     Status: None (Preliminary result)   Collection Time: 10/01/21 12:04 PM   Specimen: BLOOD  Result Value Ref Range Status   Specimen Description BLOOD LEFT ANTECUBITAL  Final   Special Requests   Final    BOTTLES DRAWN AEROBIC AND ANAEROBIC Blood Culture adequate volume   Culture   Final    NO GROWTH 3 DAYS Performed at [facility], [place]  [address] [place], [place] [address]    Report Status PENDING  Incomplete  Culture, blood (routine x 2)     Status: None (Preliminary result)   Collection Time: 10/01/21  3:15 PM   Specimen: BLOOD  Result Value Ref Range Status   Specimen Description BLOOD LEFT ANTECUBITAL  Final   Special Requests   Final    BOTTLES DRAWN AEROBIC AND ANAEROBIC Blood Culture results may not be optimal due to an inadequate volume of blood received in culture bottles   Culture   Final    NO GROWTH 3 DAYS Performed at [facility], [place] [address], [place], [place] [address]    Report Status PENDING  Incomplete    Labs: CBC: Recent Labs  Lab 09/28/21 0825 09/29/21 0446 09/30/21 0418 10/01/21 0405  WBC 45.2* 22.3* 22.5* 19.6*  HGB 8.3* 7.9* 7.7* 8.3*  HCT 26.8* 26.4* 25.6* 29.5*  MCV 96.8 100.0 98.8 105.4*  PLT 327 265 290 294   Basic Metabolic Panel: Recent Labs  Lab 09/28/21 0825 09/28/21 0927 09/28/21 1108 09/29/21 0446 09/30/21 0418 10/01/21 0405 10/02/21 0407 10/03/21  0136  NA 134* 134*  --  136 133* 133* 135 134*  K 2.3* 3.8  --  4.9 4.5 5.7* 5.6* 4.9  CL 92* 95*  --  96* 93* 94* 92* 94*  CO2 27 23  --  21* 24 18* 23 21*  GLUCOSE 100* 154*  --  250* 209* 239* 185* 187*  BUN 24* 70*  --  94* 68* 116* 109* 157*  CREATININE 2.25* 5.34*  --  6.45* 4.89* 6.36* 5.71* 7.14*  CALCIUM 8.6* 8.5*  --  8.4* 8.5* 8.9 8.9 9.0  MG 2.1 2.4 2.5*  --   --   --   --   --   PHOS 1.6* 4.5 4.4  --   --   --   --   --    Liver Function Tests: Recent Labs  Lab 09/28/21 0825 09/28/21 0927  AST 89* 78*  ALT 78* 69*  ALKPHOS 82 80  BILITOT 0.8 0.9  PROT 7.3 6.9  ALBUMIN 3.0* 2.8*   CBG: Recent Labs  Lab 10/02/21 1635 10/02/21 2021 10/02/21 2350 10/03/21 0405 10/03/21 0740  GLUCAP 162* 147* 181* 188* 150*    Discharge time spent: greater than 30 minutes.  Signed: Estill Cotta, [facility] 10/04/2021

## 2021-10-04 NOTE — TOC Transition Note (Signed)
Transition of Care [facility]) - CM/SW Discharge Note   Patient Details  Name: Jenna Brown MRN: [id number] Date of Birth: [birth year]  Transition of Care [facility]) CM/SW Contact:  Pollie Friar, RN Phone Number: 10/04/2021, 12:02 PM   Clinical Narrative:    Patient is discharging to [facility] today. Pt will transport via ambulance once the paperwork has been signed at [facility]. Bedside RN updated.  Number for report: [phone]   Final next level of care: [place] Barriers to Discharge: No Barriers Identified   Patient Goals and CMS Choice Patient states their goals for this hospitalization and ongoing recovery are:: Rehab CMS Medicare.gov Compare Post Acute Care list provided to:: Patient Represenative (must comment) Choice offered to / list presented to : Adult Children  Discharge Placement                       Discharge Plan and Services In-house Referral: Clinical Social Work   Post Acute Care Choice: [facility]                               Social Determinants of Health (SDOH) Interventions     Readmission Risk Interventions Readmission Risk Prevention Plan 09/27/2021 08/19/2021  Transportation Screening Complete Complete  PCP or Specialist Appt within 3-5 Days - Complete  HRI or [place] - Complete  Social Work Consult for [place] Planning/Counseling - Complete  Palliative Care Screening - Not Applicable  Medication Review Press photographer) Complete Complete  PCP or Specialist appointment within 3-5 days of discharge Complete -  [place] or Home Care Consult Complete -  SW Recovery Care/Counseling Consult Complete -  Palliative Care Screening Complete -  Skilled Nursing Facility Complete -  Some recent data might be hidden

## 2021-10-04 NOTE — Care Management Important Message (Signed)
Important Message  Patient Details  Name: Jenna Brown MRN: [id number] Date of Birth: [birth year]   Medicare Important Message Given:  Yes     Memory Argue 10/04/2021, 2:57 PM

## 2021-10-06 LAB — CULTURE, BLOOD (ROUTINE X 2)
Culture: NO GROWTH
Culture: NO GROWTH
Special Requests: ADEQUATE

## 2021-10-20 DEATH — deceased

## 2021-10-29 ENCOUNTER — Ambulatory Visit (HOSPITAL_BASED_OUTPATIENT_CLINIC_OR_DEPARTMENT_OTHER): Payer: Medicare Other | Admitting: Family
